# Patient Record
Sex: Female | Born: 1950
Health system: Southern US, Community
[De-identification: ages and names within clinical notes are randomized; demographics above are authoritative.]

## PROBLEM LIST (undated history)

## (undated) DIAGNOSIS — M75101 Unspecified rotator cuff tear or rupture of right shoulder, not specified as traumatic: Secondary | ICD-10-CM

## (undated) DIAGNOSIS — J189 Pneumonia, unspecified organism: Secondary | ICD-10-CM

## (undated) DIAGNOSIS — N186 End stage renal disease: Secondary | ICD-10-CM

## (undated) DIAGNOSIS — G629 Polyneuropathy, unspecified: Secondary | ICD-10-CM

## (undated) DIAGNOSIS — R0600 Dyspnea, unspecified: Secondary | ICD-10-CM

## (undated) DIAGNOSIS — N289 Disorder of kidney and ureter, unspecified: Secondary | ICD-10-CM

## (undated) DIAGNOSIS — M8580 Other specified disorders of bone density and structure, unspecified site: Secondary | ICD-10-CM

## (undated) DIAGNOSIS — M545 Low back pain, unspecified: Secondary | ICD-10-CM

## (undated) DIAGNOSIS — Z9289 Personal history of other medical treatment: Secondary | ICD-10-CM

## (undated) DIAGNOSIS — K219 Gastro-esophageal reflux disease without esophagitis: Secondary | ICD-10-CM

## (undated) DIAGNOSIS — F329 Major depressive disorder, single episode, unspecified: Secondary | ICD-10-CM

## (undated) DIAGNOSIS — F419 Anxiety disorder, unspecified: Secondary | ICD-10-CM

## (undated) DIAGNOSIS — D649 Anemia, unspecified: Secondary | ICD-10-CM

## (undated) DIAGNOSIS — F32A Depression, unspecified: Secondary | ICD-10-CM

## (undated) DIAGNOSIS — D332 Benign neoplasm of brain, unspecified: Secondary | ICD-10-CM

## (undated) DIAGNOSIS — K529 Noninfective gastroenteritis and colitis, unspecified: Secondary | ICD-10-CM

## (undated) DIAGNOSIS — K5790 Diverticulosis of intestine, part unspecified, without perforation or abscess without bleeding: Secondary | ICD-10-CM

## (undated) DIAGNOSIS — K589 Irritable bowel syndrome without diarrhea: Secondary | ICD-10-CM

## (undated) DIAGNOSIS — I10 Essential (primary) hypertension: Secondary | ICD-10-CM

## (undated) DIAGNOSIS — M1990 Unspecified osteoarthritis, unspecified site: Secondary | ICD-10-CM

## (undated) DIAGNOSIS — R51 Headache: Secondary | ICD-10-CM

## (undated) DIAGNOSIS — Z992 Dependence on renal dialysis: Secondary | ICD-10-CM

## (undated) DIAGNOSIS — M797 Fibromyalgia: Secondary | ICD-10-CM

## (undated) DIAGNOSIS — R519 Headache, unspecified: Secondary | ICD-10-CM

## (undated) DIAGNOSIS — J329 Chronic sinusitis, unspecified: Secondary | ICD-10-CM

## (undated) HISTORY — DX: Depression, unspecified: F32.A

## (undated) HISTORY — DX: Anemia, unspecified: D64.9

## (undated) HISTORY — DX: Low back pain, unspecified: M54.50

## (undated) HISTORY — DX: Other specified disorders of bone density and structure, unspecified site: M85.80

## (undated) HISTORY — DX: Irritable bowel syndrome, unspecified: K58.9

## (undated) HISTORY — PX: TONSILLECTOMY: SUR1361

## (undated) HISTORY — DX: Unspecified osteoarthritis, unspecified site: M19.90

## (undated) HISTORY — PX: TOTAL KNEE ARTHROPLASTY: SHX125

## (undated) HISTORY — PX: NASAL SINUS SURGERY: SHX719

## (undated) HISTORY — DX: Gastro-esophageal reflux disease without esophagitis: K21.9

## (undated) HISTORY — DX: Low back pain: M54.5

## (undated) HISTORY — DX: Diverticulosis of intestine, part unspecified, without perforation or abscess without bleeding: K57.90

## (undated) HISTORY — DX: Essential (primary) hypertension: I10

## (undated) HISTORY — PX: JOINT REPLACEMENT: SHX530

## (undated) HISTORY — DX: Anxiety disorder, unspecified: F41.9

## (undated) HISTORY — DX: Disorder of kidney and ureter, unspecified: N28.9

## (undated) HISTORY — DX: Major depressive disorder, single episode, unspecified: F32.9

## (undated) HISTORY — DX: Noninfective gastroenteritis and colitis, unspecified: K52.9

---

## 1998-05-22 ENCOUNTER — Encounter: Admission: RE | Admit: 1998-05-22 | Discharge: 1998-08-20 | Payer: Self-pay | Admitting: Internal Medicine

## 1999-12-25 ENCOUNTER — Other Ambulatory Visit: Admission: RE | Admit: 1999-12-25 | Discharge: 1999-12-25 | Payer: Self-pay | Admitting: Internal Medicine

## 2000-03-16 ENCOUNTER — Encounter: Admission: RE | Admit: 2000-03-16 | Discharge: 2000-06-14 | Payer: Self-pay | Admitting: Internal Medicine

## 2000-12-29 ENCOUNTER — Encounter: Admission: RE | Admit: 2000-12-29 | Discharge: 2001-03-29 | Payer: Self-pay | Admitting: Internal Medicine

## 2001-08-21 ENCOUNTER — Other Ambulatory Visit: Admission: RE | Admit: 2001-08-21 | Discharge: 2001-08-21 | Payer: Self-pay | Admitting: Internal Medicine

## 2001-12-13 HISTORY — PX: OTHER SURGICAL HISTORY: SHX169

## 2002-01-10 ENCOUNTER — Other Ambulatory Visit: Admission: RE | Admit: 2002-01-10 | Discharge: 2002-01-10 | Payer: Self-pay | Admitting: Obstetrics and Gynecology

## 2002-07-04 ENCOUNTER — Encounter: Admission: RE | Admit: 2002-07-04 | Discharge: 2002-09-11 | Payer: Self-pay

## 2002-08-29 ENCOUNTER — Encounter: Payer: Self-pay | Admitting: Internal Medicine

## 2002-08-29 ENCOUNTER — Ambulatory Visit (HOSPITAL_COMMUNITY): Admission: RE | Admit: 2002-08-29 | Discharge: 2002-08-29 | Payer: Self-pay | Admitting: Internal Medicine

## 2002-08-30 ENCOUNTER — Encounter: Payer: Self-pay | Admitting: Internal Medicine

## 2002-08-30 ENCOUNTER — Encounter (INDEPENDENT_AMBULATORY_CARE_PROVIDER_SITE_OTHER): Payer: Self-pay

## 2002-08-30 ENCOUNTER — Ambulatory Visit (HOSPITAL_COMMUNITY): Admission: RE | Admit: 2002-08-30 | Discharge: 2002-08-30 | Payer: Self-pay | Admitting: Internal Medicine

## 2002-10-08 ENCOUNTER — Ambulatory Visit (HOSPITAL_COMMUNITY): Admission: RE | Admit: 2002-10-08 | Discharge: 2002-10-08 | Payer: Self-pay | Admitting: Gastroenterology

## 2002-10-08 ENCOUNTER — Encounter: Payer: Self-pay | Admitting: Gastroenterology

## 2002-10-18 ENCOUNTER — Encounter: Payer: Self-pay | Admitting: Internal Medicine

## 2002-10-18 ENCOUNTER — Encounter: Admission: RE | Admit: 2002-10-18 | Discharge: 2002-10-18 | Payer: Self-pay | Admitting: Internal Medicine

## 2002-10-22 ENCOUNTER — Ambulatory Visit (HOSPITAL_COMMUNITY): Admission: RE | Admit: 2002-10-22 | Discharge: 2002-10-22 | Payer: Self-pay | Admitting: Internal Medicine

## 2002-10-22 ENCOUNTER — Encounter: Payer: Self-pay | Admitting: Internal Medicine

## 2002-10-30 ENCOUNTER — Ambulatory Visit (HOSPITAL_COMMUNITY): Admission: RE | Admit: 2002-10-30 | Discharge: 2002-10-30 | Payer: Self-pay | Admitting: Neurosurgery

## 2002-10-30 ENCOUNTER — Encounter: Payer: Self-pay | Admitting: Neurosurgery

## 2002-11-13 ENCOUNTER — Encounter: Payer: Self-pay | Admitting: Neurosurgery

## 2002-11-13 ENCOUNTER — Ambulatory Visit (HOSPITAL_COMMUNITY): Admission: RE | Admit: 2002-11-13 | Discharge: 2002-11-13 | Payer: Self-pay | Admitting: Neurosurgery

## 2002-11-22 ENCOUNTER — Encounter: Payer: Self-pay | Admitting: Neurosurgery

## 2002-11-26 ENCOUNTER — Inpatient Hospital Stay (HOSPITAL_COMMUNITY): Admission: RE | Admit: 2002-11-26 | Discharge: 2002-11-30 | Payer: Self-pay | Admitting: Neurosurgery

## 2002-11-26 ENCOUNTER — Encounter (INDEPENDENT_AMBULATORY_CARE_PROVIDER_SITE_OTHER): Payer: Self-pay | Admitting: *Deleted

## 2002-11-27 ENCOUNTER — Encounter: Payer: Self-pay | Admitting: Neurosurgery

## 2002-12-10 ENCOUNTER — Encounter: Payer: Self-pay | Admitting: Neurosurgery

## 2002-12-10 ENCOUNTER — Ambulatory Visit (HOSPITAL_COMMUNITY): Admission: RE | Admit: 2002-12-10 | Discharge: 2002-12-10 | Payer: Self-pay | Admitting: Neurosurgery

## 2003-01-10 ENCOUNTER — Other Ambulatory Visit: Admission: RE | Admit: 2003-01-10 | Discharge: 2003-01-10 | Payer: Self-pay | Admitting: Obstetrics and Gynecology

## 2003-03-04 ENCOUNTER — Encounter: Payer: Self-pay | Admitting: Neurosurgery

## 2003-03-04 ENCOUNTER — Ambulatory Visit (HOSPITAL_COMMUNITY): Admission: RE | Admit: 2003-03-04 | Discharge: 2003-03-04 | Payer: Self-pay | Admitting: Neurosurgery

## 2003-05-03 ENCOUNTER — Encounter: Payer: Self-pay | Admitting: Gastroenterology

## 2003-05-03 ENCOUNTER — Ambulatory Visit (HOSPITAL_COMMUNITY): Admission: RE | Admit: 2003-05-03 | Discharge: 2003-05-03 | Payer: Self-pay | Admitting: Gastroenterology

## 2003-08-12 ENCOUNTER — Ambulatory Visit (HOSPITAL_COMMUNITY): Admission: RE | Admit: 2003-08-12 | Discharge: 2003-08-12 | Payer: Self-pay | Admitting: Neurosurgery

## 2003-08-12 ENCOUNTER — Encounter: Payer: Self-pay | Admitting: Neurosurgery

## 2004-08-01 ENCOUNTER — Ambulatory Visit (HOSPITAL_COMMUNITY): Admission: RE | Admit: 2004-08-01 | Discharge: 2004-08-01 | Payer: Self-pay | Admitting: Neurosurgery

## 2004-08-12 ENCOUNTER — Ambulatory Visit (HOSPITAL_COMMUNITY): Admission: RE | Admit: 2004-08-12 | Discharge: 2004-08-12 | Payer: Self-pay | Admitting: Gastroenterology

## 2004-11-11 ENCOUNTER — Ambulatory Visit: Payer: Self-pay | Admitting: Internal Medicine

## 2004-11-17 ENCOUNTER — Other Ambulatory Visit: Admission: RE | Admit: 2004-11-17 | Discharge: 2004-11-17 | Payer: Self-pay | Admitting: Internal Medicine

## 2004-11-17 ENCOUNTER — Ambulatory Visit: Payer: Self-pay | Admitting: Internal Medicine

## 2005-08-02 ENCOUNTER — Ambulatory Visit: Payer: Self-pay | Admitting: Internal Medicine

## 2005-08-03 ENCOUNTER — Ambulatory Visit: Payer: Self-pay | Admitting: Internal Medicine

## 2005-11-17 ENCOUNTER — Ambulatory Visit: Payer: Self-pay | Admitting: Internal Medicine

## 2006-01-20 ENCOUNTER — Encounter: Admission: RE | Admit: 2006-01-20 | Discharge: 2006-01-20 | Payer: Self-pay | Admitting: Orthopedic Surgery

## 2006-01-22 ENCOUNTER — Encounter: Admission: RE | Admit: 2006-01-22 | Discharge: 2006-01-22 | Payer: Self-pay | Admitting: Orthopedic Surgery

## 2006-01-24 ENCOUNTER — Ambulatory Visit (HOSPITAL_BASED_OUTPATIENT_CLINIC_OR_DEPARTMENT_OTHER): Admission: RE | Admit: 2006-01-24 | Discharge: 2006-01-24 | Payer: Self-pay | Admitting: Orthopedic Surgery

## 2006-01-25 ENCOUNTER — Emergency Department (HOSPITAL_COMMUNITY): Admission: EM | Admit: 2006-01-25 | Discharge: 2006-01-25 | Payer: Self-pay | Admitting: Emergency Medicine

## 2006-02-07 ENCOUNTER — Other Ambulatory Visit: Admission: RE | Admit: 2006-02-07 | Discharge: 2006-02-07 | Payer: Self-pay | Admitting: Obstetrics and Gynecology

## 2006-02-16 ENCOUNTER — Ambulatory Visit: Payer: Self-pay | Admitting: Internal Medicine

## 2006-02-17 ENCOUNTER — Encounter: Admission: RE | Admit: 2006-02-17 | Discharge: 2006-03-09 | Payer: Self-pay | Admitting: Orthopedic Surgery

## 2006-05-30 ENCOUNTER — Ambulatory Visit: Payer: Self-pay | Admitting: Internal Medicine

## 2006-07-20 ENCOUNTER — Ambulatory Visit: Payer: Self-pay | Admitting: Internal Medicine

## 2006-09-24 ENCOUNTER — Ambulatory Visit (HOSPITAL_COMMUNITY): Admission: RE | Admit: 2006-09-24 | Discharge: 2006-09-24 | Payer: Self-pay | Admitting: Neurosurgery

## 2006-09-28 ENCOUNTER — Ambulatory Visit: Payer: Self-pay | Admitting: Internal Medicine

## 2006-12-28 ENCOUNTER — Ambulatory Visit: Payer: Self-pay | Admitting: Internal Medicine

## 2007-03-01 ENCOUNTER — Ambulatory Visit: Payer: Self-pay | Admitting: Internal Medicine

## 2007-05-03 ENCOUNTER — Ambulatory Visit: Payer: Self-pay | Admitting: Internal Medicine

## 2007-05-31 ENCOUNTER — Ambulatory Visit: Payer: Self-pay | Admitting: Internal Medicine

## 2007-05-31 LAB — CONVERTED CEMR LAB
ALT: 17 units/L (ref 0–40)
AST: 18 units/L (ref 0–37)
Albumin: 3.6 g/dL (ref 3.5–5.2)
Alkaline Phosphatase: 84 units/L (ref 39–117)
BUN: 20 mg/dL (ref 6–23)
Basophils Absolute: 0 10*3/uL (ref 0.0–0.1)
Basophils Relative: 0.6 % (ref 0.0–1.0)
Bilirubin, Direct: 0.1 mg/dL (ref 0.0–0.3)
CO2: 33 meq/L — ABNORMAL HIGH (ref 19–32)
Calcium: 9.6 mg/dL (ref 8.4–10.5)
Chloride: 104 meq/L (ref 96–112)
Cholesterol: 194 mg/dL (ref 0–200)
Creatinine, Ser: 0.8 mg/dL (ref 0.4–1.2)
Eosinophils Absolute: 0.2 10*3/uL (ref 0.0–0.6)
Eosinophils Relative: 3.1 % (ref 0.0–5.0)
GFR calc Af Amer: 96 mL/min
GFR calc non Af Amer: 79 mL/min
Glucose, Bld: 164 mg/dL — ABNORMAL HIGH (ref 70–99)
HCT: 33.4 % — ABNORMAL LOW (ref 36.0–46.0)
HDL: 49.9 mg/dL (ref >39.0)
Hemoglobin: 11.4 g/dL — ABNORMAL LOW (ref 12.0–15.0)
Hgb A1c MFr Bld: 7.3 % — ABNORMAL HIGH (ref 4.6–6.0)
LDL Cholesterol: 130 mg/dL — ABNORMAL HIGH (ref 0–99)
Lymphocytes Relative: 21.7 % (ref 12.0–46.0)
MCHC: 34.2 g/dL (ref 30.0–36.0)
MCV: 87.7 fL (ref 78.0–100.0)
Monocytes Absolute: 0.3 10*3/uL (ref 0.2–0.7)
Monocytes Relative: 3.7 % (ref 3.0–11.0)
Neutro Abs: 5.4 10*3/uL (ref 1.4–7.7)
Neutrophils Relative %: 70.9 % (ref 43.0–77.0)
Platelets: 269 10*3/uL (ref 150–400)
Potassium: 4.8 meq/L (ref 3.5–5.1)
RBC: 3.81 M/uL — ABNORMAL LOW (ref 3.87–5.11)
RDW: 12.1 % (ref 11.5–14.6)
Sodium: 140 meq/L (ref 135–145)
TSH: 1.6 microintl units/mL (ref 0.35–5.50)
Total Bilirubin: 0.3 mg/dL (ref 0.3–1.2)
Total CHOL/HDL Ratio: 3.9
Total Protein: 7.2 g/dL (ref 6.0–8.3)
Triglycerides: 69 mg/dL (ref 0–149)
VLDL: 14 mg/dL (ref 0–40)
Vit D, 1,25-Dihydroxy: 39 (ref 20–57)
WBC: 7.5 10*3/uL (ref 4.5–10.5)

## 2007-08-02 ENCOUNTER — Ambulatory Visit: Payer: Self-pay | Admitting: Internal Medicine

## 2007-09-29 ENCOUNTER — Ambulatory Visit: Payer: Self-pay | Admitting: Internal Medicine

## 2007-10-04 ENCOUNTER — Inpatient Hospital Stay (HOSPITAL_COMMUNITY): Admission: RE | Admit: 2007-10-04 | Discharge: 2007-10-08 | Payer: Self-pay | Admitting: Orthopedic Surgery

## 2007-10-07 ENCOUNTER — Encounter: Payer: Self-pay | Admitting: Internal Medicine

## 2007-10-07 DIAGNOSIS — K219 Gastro-esophageal reflux disease without esophagitis: Secondary | ICD-10-CM | POA: Insufficient documentation

## 2007-10-07 DIAGNOSIS — E119 Type 2 diabetes mellitus without complications: Secondary | ICD-10-CM | POA: Insufficient documentation

## 2007-10-07 DIAGNOSIS — I10 Essential (primary) hypertension: Secondary | ICD-10-CM | POA: Insufficient documentation

## 2007-10-07 DIAGNOSIS — E1129 Type 2 diabetes mellitus with other diabetic kidney complication: Secondary | ICD-10-CM | POA: Insufficient documentation

## 2007-10-07 DIAGNOSIS — R809 Proteinuria, unspecified: Secondary | ICD-10-CM | POA: Insufficient documentation

## 2007-10-12 ENCOUNTER — Encounter: Payer: Self-pay | Admitting: Internal Medicine

## 2007-10-19 ENCOUNTER — Telehealth: Payer: Self-pay | Admitting: Internal Medicine

## 2007-10-27 ENCOUNTER — Encounter: Admission: RE | Admit: 2007-10-27 | Discharge: 2007-11-17 | Payer: Self-pay | Admitting: Orthopedic Surgery

## 2007-11-08 ENCOUNTER — Ambulatory Visit: Payer: Self-pay | Admitting: Internal Medicine

## 2007-11-08 DIAGNOSIS — F411 Generalized anxiety disorder: Secondary | ICD-10-CM

## 2007-11-08 DIAGNOSIS — M545 Low back pain, unspecified: Secondary | ICD-10-CM | POA: Insufficient documentation

## 2007-11-08 DIAGNOSIS — M1990 Unspecified osteoarthritis, unspecified site: Secondary | ICD-10-CM | POA: Insufficient documentation

## 2007-11-08 DIAGNOSIS — F32A Depression, unspecified: Secondary | ICD-10-CM | POA: Insufficient documentation

## 2007-11-08 DIAGNOSIS — F419 Anxiety disorder, unspecified: Secondary | ICD-10-CM | POA: Insufficient documentation

## 2007-11-08 DIAGNOSIS — F329 Major depressive disorder, single episode, unspecified: Secondary | ICD-10-CM

## 2007-11-08 LAB — CONVERTED CEMR LAB: Blood Glucose, Fingerstick: 174

## 2008-01-10 ENCOUNTER — Ambulatory Visit: Payer: Self-pay | Admitting: Internal Medicine

## 2008-01-10 DIAGNOSIS — E7800 Pure hypercholesterolemia, unspecified: Secondary | ICD-10-CM | POA: Insufficient documentation

## 2008-01-10 LAB — CONVERTED CEMR LAB: Blood Glucose, Fingerstick: 164

## 2008-01-12 LAB — CONVERTED CEMR LAB
ALT: 19 units/L (ref 0–35)
AST: 17 units/L (ref 0–37)
Albumin: 3.5 g/dL (ref 3.5–5.2)
Alkaline Phosphatase: 93 units/L (ref 39–117)
BUN: 23 mg/dL (ref 6–23)
Bilirubin, Direct: 0.1 mg/dL (ref 0.0–0.3)
CO2: 30 meq/L (ref 19–32)
Calcium: 9.6 mg/dL (ref 8.4–10.5)
Chloride: 102 meq/L (ref 96–112)
Cholesterol: 164 mg/dL (ref 0–200)
Creatinine, Ser: 0.8 mg/dL (ref 0.4–1.2)
GFR calc Af Amer: 96 mL/min
GFR calc non Af Amer: 79 mL/min
Glucose, Bld: 172 mg/dL — ABNORMAL HIGH (ref 70–99)
HDL: 44.4 mg/dL (ref >39.0)
Hgb A1c MFr Bld: 7.4 % — ABNORMAL HIGH (ref 4.6–6.0)
LDL Cholesterol: 95 mg/dL (ref 0–99)
Potassium: 5 meq/L (ref 3.5–5.1)
Sodium: 138 meq/L (ref 135–145)
TSH: 2.64 microintl units/mL (ref 0.35–5.50)
Total Bilirubin: 0.5 mg/dL (ref 0.3–1.2)
Total CHOL/HDL Ratio: 3.7
Total Protein: 6.6 g/dL (ref 6.0–8.3)
Triglycerides: 122 mg/dL (ref 0–149)
VLDL: 24 mg/dL (ref 0–40)

## 2008-01-18 ENCOUNTER — Telehealth: Payer: Self-pay | Admitting: Internal Medicine

## 2008-01-31 ENCOUNTER — Encounter: Payer: Self-pay | Admitting: Internal Medicine

## 2008-02-02 ENCOUNTER — Inpatient Hospital Stay (HOSPITAL_COMMUNITY): Admission: RE | Admit: 2008-02-02 | Discharge: 2008-02-05 | Payer: Self-pay | Admitting: Orthopedic Surgery

## 2008-03-04 ENCOUNTER — Encounter: Admission: RE | Admit: 2008-03-04 | Discharge: 2008-04-11 | Payer: Self-pay | Admitting: Orthopedic Surgery

## 2008-03-27 ENCOUNTER — Telehealth: Payer: Self-pay | Admitting: Internal Medicine

## 2008-04-01 ENCOUNTER — Telehealth (INDEPENDENT_AMBULATORY_CARE_PROVIDER_SITE_OTHER): Payer: Self-pay | Admitting: *Deleted

## 2008-04-08 ENCOUNTER — Ambulatory Visit: Payer: Self-pay | Admitting: Internal Medicine

## 2008-04-10 ENCOUNTER — Ambulatory Visit: Payer: Self-pay | Admitting: Internal Medicine

## 2008-04-10 DIAGNOSIS — M25569 Pain in unspecified knee: Secondary | ICD-10-CM | POA: Insufficient documentation

## 2008-04-10 LAB — CONVERTED CEMR LAB
ALT: 16 units/L (ref 0–35)
AST: 18 units/L (ref 0–37)
Albumin: 4.2 g/dL (ref 3.5–5.2)
Alkaline Phosphatase: 92 units/L (ref 39–117)
BUN: 27 mg/dL — ABNORMAL HIGH (ref 6–23)
Bilirubin, Direct: 0.1 mg/dL (ref 0.0–0.3)
CO2: 24 meq/L (ref 19–32)
Calcium: 10.1 mg/dL (ref 8.4–10.5)
Chloride: 105 meq/L (ref 96–112)
Creatinine, Ser: 1.1 mg/dL (ref 0.4–1.2)
GFR calc Af Amer: 66 mL/min
GFR calc non Af Amer: 55 mL/min
Glucose, Bld: 177 mg/dL — ABNORMAL HIGH (ref 70–99)
Hgb A1c MFr Bld: 7.2 % — ABNORMAL HIGH (ref 4.6–6.0)
Potassium: 5 meq/L (ref 3.5–5.1)
Sodium: 140 meq/L (ref 135–145)
TSH: 1.89 microintl units/mL (ref 0.35–5.50)
Total Bilirubin: 0.7 mg/dL (ref 0.3–1.2)
Total Protein: 7.9 g/dL (ref 6.0–8.3)

## 2008-05-24 ENCOUNTER — Telehealth: Payer: Self-pay | Admitting: Internal Medicine

## 2008-06-04 ENCOUNTER — Telehealth: Payer: Self-pay | Admitting: Internal Medicine

## 2008-06-05 ENCOUNTER — Telehealth: Payer: Self-pay | Admitting: Internal Medicine

## 2008-07-08 ENCOUNTER — Ambulatory Visit: Payer: Self-pay | Admitting: Internal Medicine

## 2008-07-09 LAB — CONVERTED CEMR LAB
BUN: 21 mg/dL
CO2: 28 meq/L
Calcium: 9.7 mg/dL
Chloride: 100 meq/L
Creatinine, Ser: 1 mg/dL
GFR calc Af Amer: 74 mL/min
GFR calc non Af Amer: 61 mL/min
Glucose, Bld: 166 mg/dL — ABNORMAL HIGH
Hgb A1c MFr Bld: 8 % — ABNORMAL HIGH
Potassium: 4.7 meq/L
Sodium: 138 meq/L

## 2008-07-10 ENCOUNTER — Ambulatory Visit: Payer: Self-pay | Admitting: Internal Medicine

## 2008-07-12 ENCOUNTER — Telehealth: Payer: Self-pay | Admitting: Internal Medicine

## 2008-07-24 ENCOUNTER — Telehealth: Payer: Self-pay | Admitting: Internal Medicine

## 2008-08-26 ENCOUNTER — Telehealth: Payer: Self-pay | Admitting: Internal Medicine

## 2008-09-25 ENCOUNTER — Ambulatory Visit: Payer: Self-pay | Admitting: Gastroenterology

## 2008-09-25 ENCOUNTER — Inpatient Hospital Stay (HOSPITAL_COMMUNITY): Admission: AD | Admit: 2008-09-25 | Discharge: 2008-09-30 | Payer: Self-pay | Admitting: Gastroenterology

## 2008-09-25 ENCOUNTER — Telehealth: Payer: Self-pay | Admitting: Gastroenterology

## 2008-09-27 ENCOUNTER — Encounter: Payer: Self-pay | Admitting: Gastroenterology

## 2008-09-30 ENCOUNTER — Encounter: Payer: Self-pay | Admitting: Gastroenterology

## 2008-10-01 ENCOUNTER — Encounter: Payer: Self-pay | Admitting: Gastroenterology

## 2008-10-15 ENCOUNTER — Telehealth: Payer: Self-pay | Admitting: Internal Medicine

## 2008-10-16 ENCOUNTER — Ambulatory Visit: Payer: Self-pay | Admitting: Internal Medicine

## 2008-10-16 DIAGNOSIS — K529 Noninfective gastroenteritis and colitis, unspecified: Secondary | ICD-10-CM | POA: Insufficient documentation

## 2008-10-16 DIAGNOSIS — K5289 Other specified noninfective gastroenteritis and colitis: Secondary | ICD-10-CM | POA: Insufficient documentation

## 2008-10-16 DIAGNOSIS — K559 Vascular disorder of intestine, unspecified: Secondary | ICD-10-CM | POA: Insufficient documentation

## 2008-10-21 LAB — CONVERTED CEMR LAB
BUN: 20 mg/dL (ref 6–23)
CO2: 30 meq/L (ref 19–32)
Calcium: 9.5 mg/dL (ref 8.4–10.5)
Chloride: 103 meq/L (ref 96–112)
Creatinine, Ser: 0.7 mg/dL (ref 0.4–1.2)
GFR calc Af Amer: 111 mL/min
GFR calc non Af Amer: 92 mL/min
Glucose, Bld: 128 mg/dL — ABNORMAL HIGH (ref 70–99)
Hgb A1c MFr Bld: 6.8 % — ABNORMAL HIGH (ref 4.6–6.0)
Potassium: 4.4 meq/L (ref 3.5–5.1)
Sodium: 140 meq/L (ref 135–145)

## 2008-12-13 DIAGNOSIS — K529 Noninfective gastroenteritis and colitis, unspecified: Secondary | ICD-10-CM

## 2008-12-13 HISTORY — DX: Noninfective gastroenteritis and colitis, unspecified: K52.9

## 2008-12-30 ENCOUNTER — Telehealth: Payer: Self-pay | Admitting: Internal Medicine

## 2009-01-08 ENCOUNTER — Ambulatory Visit: Payer: Self-pay | Admitting: Internal Medicine

## 2009-01-08 LAB — CONVERTED CEMR LAB
ALT: 17 units/L (ref 0–35)
AST: 19 units/L (ref 0–37)
Albumin: 3.9 g/dL (ref 3.5–5.2)
Alkaline Phosphatase: 62 units/L (ref 39–117)
BUN: 39 mg/dL — ABNORMAL HIGH (ref 6–23)
Basophils Absolute: 0.1 10*3/uL (ref 0.0–0.1)
Basophils Relative: 1.1 % (ref 0.0–3.0)
Bilirubin, Direct: 0.1 mg/dL (ref 0.0–0.3)
CO2: 28 meq/L (ref 19–32)
Calcium: 9.7 mg/dL (ref 8.4–10.5)
Chloride: 105 meq/L (ref 96–112)
Cholesterol: 175 mg/dL (ref 0–200)
Creatinine, Ser: 1 mg/dL (ref 0.4–1.2)
Eosinophils Absolute: 0.3 10*3/uL (ref 0.0–0.7)
Eosinophils Relative: 3.2 % (ref 0.0–5.0)
GFR calc Af Amer: 74 mL/min
GFR calc non Af Amer: 61 mL/min
Glucose, Bld: 135 mg/dL — ABNORMAL HIGH (ref 70–99)
HCT: 36.8 % (ref 36.0–46.0)
HDL: 56.8 mg/dL (ref >39.0)
Hemoglobin: 12.6 g/dL (ref 12.0–15.0)
Hgb A1c MFr Bld: 6.4 % — ABNORMAL HIGH (ref 4.6–6.0)
LDL Cholesterol: 110 mg/dL — ABNORMAL HIGH (ref 0–99)
Lymphocytes Relative: 27.2 % (ref 12.0–46.0)
MCHC: 34.3 g/dL (ref 30.0–36.0)
MCV: 91.4 fL (ref 78.0–100.0)
Monocytes Absolute: 0.4 10*3/uL (ref 0.1–1.0)
Monocytes Relative: 5.5 % (ref 3.0–12.0)
Neutro Abs: 5.1 10*3/uL (ref 1.4–7.7)
Neutrophils Relative %: 63 % (ref 43.0–77.0)
Platelets: 197 10*3/uL (ref 150–400)
Potassium: 4.9 meq/L (ref 3.5–5.1)
RBC: 4.03 M/uL (ref 3.87–5.11)
RDW: 11.7 % (ref 11.5–14.6)
Sodium: 138 meq/L (ref 135–145)
TSH: 1.63 microintl units/mL (ref 0.35–5.50)
Total Bilirubin: 0.8 mg/dL (ref 0.3–1.2)
Total CHOL/HDL Ratio: 3.1
Total Protein: 7.2 g/dL (ref 6.0–8.3)
Triglycerides: 41 mg/dL (ref 0–149)
VLDL: 8 mg/dL (ref 0–40)
Vit D, 1,25-Dihydroxy: 64 (ref 30–89)
WBC: 8.1 10*3/uL (ref 4.5–10.5)

## 2009-01-15 ENCOUNTER — Ambulatory Visit: Payer: Self-pay | Admitting: Internal Medicine

## 2009-01-15 DIAGNOSIS — H43399 Other vitreous opacities, unspecified eye: Secondary | ICD-10-CM | POA: Insufficient documentation

## 2009-01-16 ENCOUNTER — Ambulatory Visit: Payer: Self-pay | Admitting: Gastroenterology

## 2009-01-31 ENCOUNTER — Telehealth: Payer: Self-pay | Admitting: Internal Medicine

## 2009-03-24 ENCOUNTER — Encounter: Payer: Self-pay | Admitting: Internal Medicine

## 2009-04-23 ENCOUNTER — Ambulatory Visit: Payer: Self-pay | Admitting: Internal Medicine

## 2009-04-23 LAB — CONVERTED CEMR LAB
BUN: 32 mg/dL — ABNORMAL HIGH (ref 6–23)
CO2: 28 meq/L (ref 19–32)
Calcium: 9.7 mg/dL (ref 8.4–10.5)
Chloride: 108 meq/L (ref 96–112)
Creatinine, Ser: 1 mg/dL (ref 0.4–1.2)
GFR calc non Af Amer: 60.7 mL/min (ref >60)
Glucose, Bld: 157 mg/dL — ABNORMAL HIGH (ref 70–99)
Hgb A1c MFr Bld: 6.2 % (ref 4.6–6.5)
Potassium: 5 meq/L (ref 3.5–5.1)
Sodium: 144 meq/L (ref 135–145)

## 2009-04-30 ENCOUNTER — Ambulatory Visit: Payer: Self-pay | Admitting: Internal Medicine

## 2009-05-16 ENCOUNTER — Telehealth: Payer: Self-pay | Admitting: Internal Medicine

## 2009-05-19 ENCOUNTER — Telehealth: Payer: Self-pay | Admitting: Internal Medicine

## 2009-05-27 ENCOUNTER — Encounter: Payer: Self-pay | Admitting: Internal Medicine

## 2009-06-02 ENCOUNTER — Telehealth: Payer: Self-pay | Admitting: Internal Medicine

## 2009-06-17 ENCOUNTER — Telehealth: Payer: Self-pay | Admitting: Internal Medicine

## 2009-06-18 ENCOUNTER — Ambulatory Visit: Payer: Self-pay | Admitting: Internal Medicine

## 2009-06-18 DIAGNOSIS — N259 Disorder resulting from impaired renal tubular function, unspecified: Secondary | ICD-10-CM | POA: Insufficient documentation

## 2009-06-18 DIAGNOSIS — D649 Anemia, unspecified: Secondary | ICD-10-CM | POA: Insufficient documentation

## 2009-06-18 DIAGNOSIS — R197 Diarrhea, unspecified: Secondary | ICD-10-CM | POA: Insufficient documentation

## 2009-06-18 LAB — CONVERTED CEMR LAB
ALT: 12 units/L (ref 0–35)
AST: 21 units/L (ref 0–37)
Albumin: 3.1 g/dL — ABNORMAL LOW (ref 3.5–5.2)
Alkaline Phosphatase: 61 units/L (ref 39–117)
BUN: 34 mg/dL — ABNORMAL HIGH (ref 6–23)
Basophils Absolute: 0.1 10*3/uL (ref 0.0–0.1)
Basophils Relative: 0.9 % (ref 0.0–3.0)
Bilirubin, Direct: 0.2 mg/dL (ref 0.0–0.3)
CO2: 21 meq/L (ref 19–32)
Calcium: 9 mg/dL (ref 8.4–10.5)
Chloride: 105 meq/L (ref 96–112)
Creatinine, Ser: 1.5 mg/dL — ABNORMAL HIGH (ref 0.4–1.2)
Eosinophils Absolute: 0.2 10*3/uL (ref 0.0–0.7)
Eosinophils Relative: 2.7 % (ref 0.0–5.0)
GFR calc non Af Amer: 37.99 mL/min (ref >60)
Glucose, Bld: 111 mg/dL — ABNORMAL HIGH (ref 70–99)
HCT: 30.5 % — ABNORMAL LOW (ref 36.0–46.0)
Hemoglobin: 10.3 g/dL — ABNORMAL LOW (ref 12.0–15.0)
Lymphocytes Relative: 25.5 % (ref 12.0–46.0)
Lymphs Abs: 1.9 10*3/uL (ref 0.7–4.0)
MCHC: 33.8 g/dL (ref 30.0–36.0)
MCV: 93.6 fL (ref 78.0–100.0)
Monocytes Absolute: 0.8 10*3/uL (ref 0.1–1.0)
Monocytes Relative: 10.6 % (ref 3.0–12.0)
Neutro Abs: 4.3 10*3/uL (ref 1.4–7.7)
Neutrophils Relative %: 60.3 % (ref 43.0–77.0)
Platelets: 230 10*3/uL (ref 150.0–400.0)
Potassium: 5.4 meq/L — ABNORMAL HIGH (ref 3.5–5.1)
RBC: 3.25 M/uL — ABNORMAL LOW (ref 3.87–5.11)
RDW: 11.7 % (ref 11.5–14.6)
Sodium: 138 meq/L (ref 135–145)
Total Bilirubin: 0.8 mg/dL (ref 0.3–1.2)
Total Protein: 6.6 g/dL (ref 6.0–8.3)
WBC: 7.3 10*3/uL (ref 4.5–10.5)

## 2009-06-19 ENCOUNTER — Encounter: Payer: Self-pay | Admitting: Internal Medicine

## 2009-06-23 ENCOUNTER — Ambulatory Visit: Payer: Self-pay | Admitting: Internal Medicine

## 2009-06-23 ENCOUNTER — Encounter: Payer: Self-pay | Admitting: Internal Medicine

## 2009-06-23 LAB — HM COLONOSCOPY

## 2009-06-25 ENCOUNTER — Ambulatory Visit: Payer: Self-pay | Admitting: Internal Medicine

## 2009-06-26 ENCOUNTER — Telehealth: Payer: Self-pay | Admitting: Internal Medicine

## 2009-06-26 ENCOUNTER — Encounter: Payer: Self-pay | Admitting: Internal Medicine

## 2009-06-27 ENCOUNTER — Telehealth: Payer: Self-pay | Admitting: Internal Medicine

## 2009-06-27 LAB — CONVERTED CEMR LAB
ALT: 11 units/L (ref 0–35)
AST: 14 units/L (ref 0–37)
Albumin: 3.4 g/dL — ABNORMAL LOW (ref 3.5–5.2)
Alkaline Phosphatase: 61 units/L (ref 39–117)
BUN: 27 mg/dL — ABNORMAL HIGH (ref 6–23)
CO2: 25 meq/L (ref 19–32)
Calcium: 9.4 mg/dL (ref 8.4–10.5)
Chloride: 108 meq/L (ref 96–112)
Creatinine, Ser: 1.1 mg/dL (ref 0.4–1.2)
GFR calc non Af Amer: 54.34 mL/min (ref >60)
Glucose, Bld: 89 mg/dL (ref 70–99)
Potassium: 4.5 meq/L (ref 3.5–5.1)
Sodium: 143 meq/L (ref 135–145)
Total Bilirubin: 0.3 mg/dL (ref 0.3–1.2)
Total Protein: 6.7 g/dL (ref 6.0–8.3)

## 2009-07-23 ENCOUNTER — Ambulatory Visit: Payer: Self-pay | Admitting: Internal Medicine

## 2009-07-23 LAB — CONVERTED CEMR LAB
ALT: 16 units/L (ref 0–35)
AST: 16 units/L (ref 0–37)
Albumin: 3.7 g/dL (ref 3.5–5.2)
Alkaline Phosphatase: 53 units/L (ref 39–117)
BUN: 28 mg/dL — ABNORMAL HIGH (ref 6–23)
Bilirubin, Direct: 0.1 mg/dL (ref 0.0–0.3)
CO2: 31 meq/L (ref 19–32)
Calcium: 9.6 mg/dL (ref 8.4–10.5)
Chloride: 104 meq/L (ref 96–112)
Cholesterol: 148 mg/dL (ref 0–200)
Creatinine, Ser: 1 mg/dL (ref 0.4–1.2)
GFR calc non Af Amer: 60.64 mL/min (ref >60)
Glucose, Bld: 110 mg/dL — ABNORMAL HIGH (ref 70–99)
HDL: 66.9 mg/dL (ref >39.00)
Hgb A1c MFr Bld: 5.8 % (ref 4.6–6.5)
LDL Cholesterol: 77 mg/dL (ref 0–99)
Potassium: 4.3 meq/L (ref 3.5–5.1)
Sodium: 141 meq/L (ref 135–145)
TSH: 1.39 microintl units/mL (ref 0.35–5.50)
Total Bilirubin: 0.9 mg/dL (ref 0.3–1.2)
Total CHOL/HDL Ratio: 2
Total Protein: 6.6 g/dL (ref 6.0–8.3)
Triglycerides: 21 mg/dL (ref 0.0–149.0)
VLDL: 4.2 mg/dL (ref 0.0–40.0)

## 2009-07-30 ENCOUNTER — Ambulatory Visit: Payer: Self-pay | Admitting: Internal Medicine

## 2009-08-06 ENCOUNTER — Ambulatory Visit: Payer: Self-pay | Admitting: Internal Medicine

## 2009-10-02 ENCOUNTER — Telehealth: Payer: Self-pay | Admitting: Internal Medicine

## 2009-10-15 ENCOUNTER — Encounter: Payer: Self-pay | Admitting: Internal Medicine

## 2009-10-27 ENCOUNTER — Ambulatory Visit: Payer: Self-pay | Admitting: Internal Medicine

## 2009-10-27 LAB — CONVERTED CEMR LAB
ALT: 17 units/L (ref 0–35)
AST: 21 units/L (ref 0–37)
Albumin: 3.7 g/dL (ref 3.5–5.2)
Alkaline Phosphatase: 64 units/L (ref 39–117)
BUN: 30 mg/dL — ABNORMAL HIGH (ref 6–23)
Bilirubin, Direct: 0.1 mg/dL (ref 0.0–0.3)
CO2: 26 meq/L (ref 19–32)
Calcium: 9.3 mg/dL (ref 8.4–10.5)
Chloride: 103 meq/L (ref 96–112)
Cholesterol: 170 mg/dL (ref 0–200)
Creatinine, Ser: 0.9 mg/dL (ref 0.4–1.2)
GFR calc non Af Amer: 68.42 mL/min (ref >60)
Glucose, Bld: 88 mg/dL (ref 70–99)
HDL: 65.8 mg/dL (ref >39.00)
Hgb A1c MFr Bld: 6.3 % (ref 4.6–6.5)
LDL Cholesterol: 95 mg/dL (ref 0–99)
Potassium: 4.6 meq/L (ref 3.5–5.1)
Sodium: 139 meq/L (ref 135–145)
Total Bilirubin: 0.6 mg/dL (ref 0.3–1.2)
Total CHOL/HDL Ratio: 3
Total Protein: 6.6 g/dL (ref 6.0–8.3)
Triglycerides: 48 mg/dL (ref 0.0–149.0)
VLDL: 9.6 mg/dL (ref 0.0–40.0)

## 2009-10-29 ENCOUNTER — Ambulatory Visit: Payer: Self-pay | Admitting: Internal Medicine

## 2009-10-29 DIAGNOSIS — Z87891 Personal history of nicotine dependence: Secondary | ICD-10-CM | POA: Insufficient documentation

## 2009-10-29 DIAGNOSIS — L259 Unspecified contact dermatitis, unspecified cause: Secondary | ICD-10-CM | POA: Insufficient documentation

## 2009-11-13 ENCOUNTER — Encounter: Payer: Self-pay | Admitting: Internal Medicine

## 2010-01-08 ENCOUNTER — Telehealth: Payer: Self-pay | Admitting: Internal Medicine

## 2010-01-28 ENCOUNTER — Ambulatory Visit: Payer: Self-pay | Admitting: Internal Medicine

## 2010-01-28 LAB — CONVERTED CEMR LAB
ALT: 32 units/L (ref 0–35)
AST: 33 units/L (ref 0–37)
Albumin: 3.8 g/dL (ref 3.5–5.2)
Alkaline Phosphatase: 55 units/L (ref 39–117)
BUN: 40 mg/dL — ABNORMAL HIGH (ref 6–23)
Bilirubin, Direct: 0.1 mg/dL (ref 0.0–0.3)
CO2: 30 meq/L (ref 19–32)
Calcium: 9.4 mg/dL (ref 8.4–10.5)
Chloride: 107 meq/L (ref 96–112)
Creatinine, Ser: 1.1 mg/dL (ref 0.4–1.2)
GFR calc non Af Amer: 54.23 mL/min (ref >60)
Glucose, Bld: 94 mg/dL (ref 70–99)
Hgb A1c MFr Bld: 6.2 % (ref 4.6–6.5)
Potassium: 4.9 meq/L (ref 3.5–5.1)
Sodium: 140 meq/L (ref 135–145)
Total Bilirubin: 0.6 mg/dL (ref 0.3–1.2)
Total Protein: 6.9 g/dL (ref 6.0–8.3)

## 2010-02-04 ENCOUNTER — Ambulatory Visit: Payer: Self-pay | Admitting: Internal Medicine

## 2010-02-04 DIAGNOSIS — K117 Disturbances of salivary secretion: Secondary | ICD-10-CM | POA: Insufficient documentation

## 2010-02-13 ENCOUNTER — Telehealth: Payer: Self-pay | Admitting: Internal Medicine

## 2010-03-19 ENCOUNTER — Telehealth: Payer: Self-pay | Admitting: Internal Medicine

## 2010-04-01 ENCOUNTER — Telehealth: Payer: Self-pay | Admitting: Internal Medicine

## 2010-04-08 ENCOUNTER — Telehealth: Payer: Self-pay | Admitting: Internal Medicine

## 2010-04-08 ENCOUNTER — Ambulatory Visit: Payer: Self-pay | Admitting: Internal Medicine

## 2010-04-08 DIAGNOSIS — R5383 Other fatigue: Secondary | ICD-10-CM | POA: Insufficient documentation

## 2010-04-08 DIAGNOSIS — M2550 Pain in unspecified joint: Secondary | ICD-10-CM | POA: Insufficient documentation

## 2010-04-08 DIAGNOSIS — N390 Urinary tract infection, site not specified: Secondary | ICD-10-CM | POA: Insufficient documentation

## 2010-04-08 LAB — CONVERTED CEMR LAB
ALT: 23 units/L (ref 0–35)
AST: 21 units/L (ref 0–37)
Albumin: 3.5 g/dL (ref 3.5–5.2)
Alkaline Phosphatase: 70 units/L (ref 39–117)
BUN: 95 mg/dL (ref 6–23)
Basophils Absolute: 0 10*3/uL (ref 0.0–0.1)
Basophils Relative: 0.1 % (ref 0.0–3.0)
Bilirubin Urine: NEGATIVE
Bilirubin, Direct: 0.1 mg/dL (ref 0.0–0.3)
CO2: 27 meq/L (ref 19–32)
Calcium: 9.6 mg/dL (ref 8.4–10.5)
Chloride: 95 meq/L — ABNORMAL LOW (ref 96–112)
Creatinine, Ser: 2.4 mg/dL — ABNORMAL HIGH (ref 0.4–1.2)
Eosinophils Absolute: 0.2 10*3/uL (ref 0.0–0.7)
Eosinophils Relative: 1.5 % (ref 0.0–5.0)
GFR calc non Af Amer: 22.03 mL/min (ref >60)
Glucose, Bld: 139 mg/dL — ABNORMAL HIGH (ref 70–99)
HCT: 32.1 % — ABNORMAL LOW (ref 36.0–46.0)
Hemoglobin, Urine: NEGATIVE
Hemoglobin: 11.1 g/dL — ABNORMAL LOW (ref 12.0–15.0)
Ketones, ur: NEGATIVE mg/dL
Lymphocytes Relative: 13.9 % (ref 12.0–46.0)
Lymphs Abs: 2 10*3/uL (ref 0.7–4.0)
MCHC: 34.5 g/dL (ref 30.0–36.0)
MCV: 92.7 fL (ref 78.0–100.0)
Monocytes Absolute: 1.2 10*3/uL — ABNORMAL HIGH (ref 0.1–1.0)
Monocytes Relative: 8.3 % (ref 3.0–12.0)
Neutro Abs: 11.1 10*3/uL — ABNORMAL HIGH (ref 1.4–7.7)
Neutrophils Relative %: 76.2 % (ref 43.0–77.0)
Nitrite: NEGATIVE
Platelets: 190 10*3/uL (ref 150.0–400.0)
Potassium: 6.1 meq/L (ref 3.5–5.1)
RBC: 3.46 M/uL — ABNORMAL LOW (ref 3.87–5.11)
RDW: 13.5 % (ref 11.5–14.6)
Sodium: 132 meq/L — ABNORMAL LOW (ref 135–145)
Specific Gravity, Urine: 1.025 (ref 1.000–1.030)
TSH: 3.44 microintl units/mL (ref 0.35–5.50)
Total Bilirubin: 0.5 mg/dL (ref 0.3–1.2)
Total Protein, Urine: NEGATIVE mg/dL
Total Protein: 6.4 g/dL (ref 6.0–8.3)
Urine Glucose: NEGATIVE mg/dL
Urobilinogen, UA: 0.2 (ref 0.0–1.0)
WBC: 14.6 10*3/uL — ABNORMAL HIGH (ref 4.5–10.5)
pH: 5 (ref 5.0–8.0)

## 2010-04-09 ENCOUNTER — Telehealth: Payer: Self-pay | Admitting: Internal Medicine

## 2010-04-10 ENCOUNTER — Telehealth (INDEPENDENT_AMBULATORY_CARE_PROVIDER_SITE_OTHER): Payer: Self-pay | Admitting: *Deleted

## 2010-04-10 ENCOUNTER — Ambulatory Visit: Payer: Self-pay | Admitting: Internal Medicine

## 2010-04-10 LAB — CONVERTED CEMR LAB
BUN: 41 mg/dL — ABNORMAL HIGH (ref 6–23)
Bilirubin Urine: NEGATIVE
CO2: 28 meq/L (ref 19–32)
Calcium: 9.4 mg/dL (ref 8.4–10.5)
Chloride: 101 meq/L (ref 96–112)
Creatinine, Ser: 1.1 mg/dL (ref 0.4–1.2)
GFR calc non Af Amer: 54.19 mL/min (ref >60)
Glucose, Bld: 124 mg/dL — ABNORMAL HIGH (ref 70–99)
Ketones, ur: NEGATIVE mg/dL
Leukocytes, UA: NEGATIVE
Nitrite: NEGATIVE
Potassium: 5.5 meq/L — ABNORMAL HIGH (ref 3.5–5.1)
Sodium: 136 meq/L (ref 135–145)
Specific Gravity, Urine: <=1.005 (ref 1.000–1.030)
Total Protein, Urine: NEGATIVE mg/dL
Urine Glucose: NEGATIVE mg/dL
Urobilinogen, UA: 0.2 (ref 0.0–1.0)
pH: 5.5 (ref 5.0–8.0)

## 2010-04-13 ENCOUNTER — Ambulatory Visit: Payer: Self-pay | Admitting: Internal Medicine

## 2010-04-22 ENCOUNTER — Telehealth: Payer: Self-pay | Admitting: Internal Medicine

## 2010-04-22 ENCOUNTER — Ambulatory Visit (HOSPITAL_COMMUNITY): Admission: RE | Admit: 2010-04-22 | Discharge: 2010-04-22 | Payer: Self-pay | Admitting: Internal Medicine

## 2010-04-29 ENCOUNTER — Encounter: Payer: Self-pay | Admitting: Internal Medicine

## 2010-05-13 ENCOUNTER — Telehealth: Payer: Self-pay | Admitting: Internal Medicine

## 2010-06-10 ENCOUNTER — Encounter: Payer: Self-pay | Admitting: Internal Medicine

## 2010-06-12 ENCOUNTER — Telehealth: Payer: Self-pay | Admitting: Internal Medicine

## 2010-06-28 ENCOUNTER — Encounter: Payer: Self-pay | Admitting: Internal Medicine

## 2010-07-06 ENCOUNTER — Telehealth: Payer: Self-pay | Admitting: Internal Medicine

## 2010-07-08 ENCOUNTER — Ambulatory Visit: Payer: Self-pay | Admitting: Internal Medicine

## 2010-07-08 LAB — CONVERTED CEMR LAB
BUN: 34 mg/dL — ABNORMAL HIGH (ref 6–23)
CO2: 29 meq/L (ref 19–32)
Calcium: 10.1 mg/dL (ref 8.4–10.5)
Chloride: 100 meq/L (ref 96–112)
Creatinine, Ser: 1 mg/dL (ref 0.4–1.2)
GFR calc non Af Amer: 58.41 mL/min (ref >60)
Glucose, Bld: 125 mg/dL — ABNORMAL HIGH (ref 70–99)
Hgb A1c MFr Bld: 6.2 % (ref 4.6–6.5)
Potassium: 5.6 meq/L — ABNORMAL HIGH (ref 3.5–5.1)
Sodium: 135 meq/L (ref 135–145)

## 2010-07-15 ENCOUNTER — Ambulatory Visit: Payer: Self-pay | Admitting: Internal Medicine

## 2010-07-15 DIAGNOSIS — E875 Hyperkalemia: Secondary | ICD-10-CM | POA: Insufficient documentation

## 2010-08-06 ENCOUNTER — Ambulatory Visit: Payer: Self-pay | Admitting: Internal Medicine

## 2010-08-06 DIAGNOSIS — R509 Fever, unspecified: Secondary | ICD-10-CM | POA: Insufficient documentation

## 2010-08-06 LAB — CONVERTED CEMR LAB
BUN: 36 mg/dL — ABNORMAL HIGH (ref 6–23)
Basophils Absolute: 0 10*3/uL (ref 0.0–0.1)
Basophils Relative: 0.2 % (ref 0.0–3.0)
Bilirubin Urine: NEGATIVE
CO2: 29 meq/L (ref 19–32)
Calcium: 10.1 mg/dL (ref 8.4–10.5)
Chloride: 98 meq/L (ref 96–112)
Creatinine, Ser: 1.2 mg/dL (ref 0.4–1.2)
Eosinophils Absolute: 0.1 10*3/uL (ref 0.0–0.7)
Eosinophils Relative: 0.7 % (ref 0.0–5.0)
GFR calc non Af Amer: 47.14 mL/min (ref >60)
Glucose, Bld: 74 mg/dL (ref 70–99)
HCT: 36.8 % (ref 36.0–46.0)
Hemoglobin, Urine: NEGATIVE
Hemoglobin: 12.8 g/dL (ref 12.0–15.0)
Ketones, ur: NEGATIVE mg/dL
Leukocytes, UA: NEGATIVE
Lymphocytes Relative: 10 % — ABNORMAL LOW (ref 12.0–46.0)
Lymphs Abs: 1.6 10*3/uL (ref 0.7–4.0)
MCHC: 34.6 g/dL (ref 30.0–36.0)
MCV: 91.4 fL (ref 78.0–100.0)
Monocytes Absolute: 1 10*3/uL (ref 0.1–1.0)
Monocytes Relative: 6.6 % (ref 3.0–12.0)
Neutro Abs: 13 10*3/uL — ABNORMAL HIGH (ref 1.4–7.7)
Neutrophils Relative %: 82.5 % — ABNORMAL HIGH (ref 43.0–77.0)
Nitrite: NEGATIVE
Platelets: 226 10*3/uL (ref 150.0–400.0)
Potassium: 5.1 meq/L (ref 3.5–5.1)
RBC: 4.03 M/uL (ref 3.87–5.11)
RDW: 13.1 % (ref 11.5–14.6)
Sodium: 136 meq/L (ref 135–145)
Specific Gravity, Urine: 1.01 (ref 1.000–1.030)
Total Protein, Urine: NEGATIVE mg/dL
Urine Glucose: NEGATIVE mg/dL
Urobilinogen, UA: 0.2 (ref 0.0–1.0)
WBC: 15.8 10*3/uL — ABNORMAL HIGH (ref 4.5–10.5)
pH: 5 (ref 5.0–8.0)

## 2010-08-12 ENCOUNTER — Ambulatory Visit: Payer: Self-pay | Admitting: Internal Medicine

## 2010-08-12 LAB — CONVERTED CEMR LAB
BUN: 28 mg/dL — ABNORMAL HIGH (ref 6–23)
CO2: 27 meq/L (ref 19–32)
Calcium: 9.2 mg/dL (ref 8.4–10.5)
Chloride: 103 meq/L (ref 96–112)
Creatinine, Ser: 1 mg/dL (ref 0.4–1.2)
GFR calc non Af Amer: 62.58 mL/min (ref >60)
Glucose, Bld: 165 mg/dL — ABNORMAL HIGH (ref 70–99)
Hgb A1c MFr Bld: 6.5 % (ref 4.6–6.5)
Potassium: 4.5 meq/L (ref 3.5–5.1)
Sodium: 137 meq/L (ref 135–145)

## 2010-08-18 ENCOUNTER — Telehealth: Payer: Self-pay | Admitting: Internal Medicine

## 2010-10-16 ENCOUNTER — Telehealth: Payer: Self-pay | Admitting: Internal Medicine

## 2010-10-21 ENCOUNTER — Ambulatory Visit: Payer: Self-pay | Admitting: Internal Medicine

## 2010-10-21 LAB — CONVERTED CEMR LAB
ALT: 18 units/L (ref 0–35)
AST: 17 units/L (ref 0–37)
Albumin: 3.8 g/dL (ref 3.5–5.2)
Alkaline Phosphatase: 66 units/L (ref 39–117)
BUN: 26 mg/dL — ABNORMAL HIGH (ref 6–23)
Basophils Absolute: 0.1 10*3/uL (ref 0.0–0.1)
Basophils Relative: 0.8 % (ref 0.0–3.0)
Bilirubin Urine: NEGATIVE
Bilirubin, Direct: 0.1 mg/dL (ref 0.0–0.3)
CO2: 30 meq/L (ref 19–32)
Calcium: 9.8 mg/dL (ref 8.4–10.5)
Chloride: 103 meq/L (ref 96–112)
Cholesterol: 176 mg/dL (ref 0–200)
Creatinine, Ser: 0.8 mg/dL (ref 0.4–1.2)
Eosinophils Absolute: 0.2 10*3/uL (ref 0.0–0.7)
Eosinophils Relative: 3.1 % (ref 0.0–5.0)
GFR calc non Af Amer: 74.86 mL/min (ref >60)
Glucose, Bld: 101 mg/dL — ABNORMAL HIGH (ref 70–99)
HCT: 36.1 % (ref 36.0–46.0)
HDL: 65.8 mg/dL (ref >39.00)
Hemoglobin, Urine: NEGATIVE
Hemoglobin: 12.3 g/dL (ref 12.0–15.0)
Ketones, ur: NEGATIVE mg/dL
LDL Cholesterol: 105 mg/dL — ABNORMAL HIGH (ref 0–99)
Leukocytes, UA: NEGATIVE
Lymphocytes Relative: 29.2 % (ref 12.0–46.0)
Lymphs Abs: 2 10*3/uL (ref 0.7–4.0)
MCHC: 34 g/dL (ref 30.0–36.0)
MCV: 93 fL (ref 78.0–100.0)
Monocytes Absolute: 0.3 10*3/uL (ref 0.1–1.0)
Monocytes Relative: 4.7 % (ref 3.0–12.0)
Neutro Abs: 4.3 10*3/uL (ref 1.4–7.7)
Neutrophils Relative %: 62.2 % (ref 43.0–77.0)
Nitrite: NEGATIVE
Platelets: 213 10*3/uL (ref 150.0–400.0)
Potassium: 4.8 meq/L (ref 3.5–5.1)
RBC: 3.88 M/uL (ref 3.87–5.11)
RDW: 13.1 % (ref 11.5–14.6)
Sodium: 140 meq/L (ref 135–145)
Specific Gravity, Urine: 1.01 (ref 1.000–1.030)
TSH: 0.98 microintl units/mL (ref 0.35–5.50)
Total Bilirubin: 0.6 mg/dL (ref 0.3–1.2)
Total CHOL/HDL Ratio: 3
Total Protein, Urine: NEGATIVE mg/dL
Total Protein: 6.5 g/dL (ref 6.0–8.3)
Triglycerides: 27 mg/dL (ref 0.0–149.0)
Urine Glucose: NEGATIVE mg/dL
Urobilinogen, UA: 0.2 (ref 0.0–1.0)
VLDL: 5.4 mg/dL (ref 0.0–40.0)
WBC: 6.9 10*3/uL (ref 4.5–10.5)
pH: 5.5 (ref 5.0–8.0)

## 2010-10-28 ENCOUNTER — Ambulatory Visit: Payer: Self-pay | Admitting: Internal Medicine

## 2010-10-28 DIAGNOSIS — M949 Disorder of cartilage, unspecified: Secondary | ICD-10-CM

## 2010-10-28 DIAGNOSIS — M899 Disorder of bone, unspecified: Secondary | ICD-10-CM | POA: Insufficient documentation

## 2010-11-12 ENCOUNTER — Ambulatory Visit: Payer: Self-pay | Admitting: Internal Medicine

## 2010-11-12 ENCOUNTER — Encounter: Payer: Self-pay | Admitting: Internal Medicine

## 2011-01-03 ENCOUNTER — Encounter: Payer: Self-pay | Admitting: Internal Medicine

## 2011-01-12 NOTE — Miscellaneous (Signed)
Summary: cyclobenzaprine  Clinical Lists Changes  Medications: Added new medication of CYCLOBENZAPRINE HCL 10 MG  TABS (CYCLOBENZAPRINE HCL) three times a day as needed - Signed Rx of CYCLOBENZAPRINE HCL 10 MG  TABS (CYCLOBENZAPRINE HCL) three times a day as needed;  #270 x 1;  Signed;  Entered by: Doralee Albino;  Authorized by: Cassandria Anger MD;  Method used: Telephoned to [facility], , , [place]  , Ph: , Fax: [phone]    Prescriptions: CYCLOBENZAPRINE HCL 10 MG  TABS (CYCLOBENZAPRINE HCL) three times a day as needed  #270 x 1   Entered by:   Doralee Albino   Authorized by:   Cassandria Anger MD   Signed by:   Doralee Albino on 01/31/2008   Method used:   Telephoned to ...       [facility]             , [place]         Ph:        Fax: [phone]   RxID:   [id number]

## 2011-01-12 NOTE — Progress Notes (Signed)
Summary: cipro rx  Phone Note Outgoing Call   Summary of Call: cipro prescription. Patient denied receiving earlier today. Initial call taken by: Ernestene Mention,  April 08, 2010 1:44 PM    Prescriptions: CIPROFLOXACIN HCL 500 MG TABS (CIPROFLOXACIN HCL) 1 by mouth two times a day (take once  today)  #20 x 1   Entered by:   Ernestene Mention   Authorized by:   Cassandria Anger MD   Signed by:   Ernestene Mention on 04/08/2010   Method used:   Electronically to        [facility] (retail)       [address]       [place]       [place], [place]  [address]       Ph: [phone]       Fax: [phone]   RxID:   [id number]

## 2011-01-12 NOTE — Assessment & Plan Note (Signed)
Summary: MED REFILL/CHECK UP/$50/PN   Vital Signs:  Patient profile:   60 year old female Height:      62 inches Weight:      187 pounds BMI:     34.33 Temp:     97.5 degrees F oral Pulse rate:   90 / minute BP sitting:   110 / 66  (left arm)  Vitals Entered By: Doralee Albino (Apr 30, 2009 3:36 PM)  History of Present Illness: The patient presents for a follow up of hypertension, diabetes, hyperlipidemia, OA   Current Medications (verified): 1)  Lisinopril 40 Mg  Tabs (Lisinopril) .... Once Daily 2)  Tizanidine Hcl 4 Mg  Tabs (Tizanidine Hcl) .Marland Kitchen.. 1-2 Once Daily 3)  Prilosec 20 Mg  Cpdr (Omeprazole) .... Once Daily 4)  Celebrex 200 Mg  Caps (Celecoxib) .... Two Times A Day 5)  Lasix 40 Mg  Tabs (Furosemide) .Marland Kitchen.. 1 Qd As Needed 6)  Klor-Con M10 10 Meq  Tbcr (Potassium Chloride Crys Cr) .... Once Daily As Needed 7)  Oxycontin 20 Mg  Tb12 (Oxycodone Hcl) .Marland Kitchen.. 1 Tab By Mouth Two Times A Day Please,  Fill On or After 03/29/2009 8)  Oxycodone Hcl 15 Mg  Tabs (Oxycodone Hcl) .Marland Kitchen.. 1 By Mouth Qid As Needed Pain. Please,  Fill On or After 03/29/2009 9)  Metformin Hcl 1000 Mg Tabs (Metformin Hcl) .... Take 1 Tablet By Mouth Two Times A Day 10)  Wellbutrin Sr 150 Mg Tb12 (Bupropion Hcl) .Marland Kitchen.. 1 Po Bid 11)  Cyclobenzaprine Hcl 10 Mg  Tabs (Cyclobenzaprine Hcl) .... Three Times A Day As Needed For Back Pain 12)  Vitamin D3 1000 Unit  Tabs (Cholecalciferol) .... Once Daily 13)  Detrol La 4 Mg Cp24 (Tolterodine Tartrate) .Marland Kitchen.. 1 Tablet By Mouth Daily 14)  Glimepiride 1 Mg Tabs (Glimepiride) .Marland Kitchen.. 1 Po Qd 15)  Onetouch Ultra Test   Strp (Glucose Blood) .... Use Once Daily To Two Times A Day 16)  Onetouch Ultrasoft Lancets   Misc (Lancets) .... Use Once Daily To Two Times A Day 17)  Lorazepam 2 Mg Tabs (Lorazepam) .... 1/2 or 1 By Mouth Two Times A Day Prn 18)  Femhrt Low Dose 0.5-2.5 Mg-Mcg Tabs (Norethindrone-Eth Estradiol) .... Once Daily  Allergies: 1)  ! Erythromycin (Erythromycin  Base)  Past History:  Past Medical History:    Diabetes mellitus, type II    GERD    Hypertension    IBS    FMS    Anxiety    Depression    Low back pain    Osteoarthritis    Foramen magnum ependymoma 2003    L eye vitreous humor liquification     (01/15/2009)  Past Surgical History:    Foramen magnum ependymoma  surgery 2003 Dr Rita Ohara    Total knee replacement L 2008, R 2009  Dr Maureen Ralphs     (04/10/2008)  Family History:    Family History Diabetes 1st degree relative     (11/08/2007)  Social History:    Occupation: RN    Single    Alcohol use-no     (11/08/2007)  Physical Exam  General:  overweight-appearing.   Nose:  External nasal examination shows no deformity or inflammation. Nasal mucosa are pink and moist without lesions or exudates. Mouth:  Oral mucosa and oropharynx without lesions or exudates.  Teeth in good repair. Neck:  No deformities, masses, or tenderness noted. Lungs:  Normal respiratory effort, chest expands symmetrically. Lungs are clear to auscultation, no  crackles or wheezes. Heart:  Normal rate and regular rhythm. S1 and S2 normal without gallop, murmur, click, rub or other extra sounds. Abdomen:  Bowel sounds positive,abdomen soft and non-tender without masses, organomegaly or hernias noted. Genitalia:  Normal introitus for age, no external lesions, no vaginal discharge, mucosa pink and moist, no vaginal or cervical lesions, no vaginal atrophy, no friaility or hemorrhage, normal uterus size and position, no adnexal masses or tenderness Extremities:  No clubbing, cyanosis, edema, or deformity noted with normal full range of motion of all joints.   Neurologic:  No cranial nerve deficits noted. Station and gait are normal. Plantar reflexes are down-going bilaterally. DTRs are symmetrical throughout. Sensory, motor and coordinative functions appear intact. Skin:  Intact without suspicious lesions or rashes Psych:  Oriented X3 and depressed affect.      Impression & Recommendations:  Problem # 1:  HYPERTENSION (ICD-401.9) Assessment Improved  Her updated medication list for this problem includes:    Lisinopril 40 Mg Tabs (Lisinopril) ..... Once daily    Lasix 40 Mg Tabs (Furosemide) .Marland Kitchen... 1 qd as needed  Problem # 2:  DIABETES MELLITUS, TYPE II (ICD-250.00) Assessment: Improved  Her updated medication list for this problem includes:    Lisinopril 40 Mg Tabs (Lisinopril) ..... Once daily    Metformin Hcl 1000 Mg Tabs (Metformin hcl) .Marland Kitchen... Take 1 tablet by mouth two times a day    Glimepiride 1 Mg Tabs (Glimepiride) .Marland Kitchen... 1 po qd  Problem # 3:  HYPERCHOLESTEROLEMIA (ICD-272.0) On prescription therapy  Problem # 4:  ANXIETY (ICD-300.00) Assessment: Unchanged  Her updated medication list for this problem includes:    Wellbutrin Sr 150 Mg Tb12 (Bupropion hcl) .Marland Kitchen... 1 po bid    Lorazepam 2 Mg Tabs (Lorazepam) .Marland Kitchen... 1/2 or 1 by mouth two times a day prn  Problem # 5:  GERD (ICD-530.81) Assessment: Comment Only  Her updated medication list for this problem includes:    Prilosec 20 Mg Cpdr (Omeprazole) ..... Once daily  Complete Medication List: 1)  Lisinopril 40 Mg Tabs (Lisinopril) .... Once daily 2)  Tizanidine Hcl 4 Mg Tabs (Tizanidine hcl) .Marland Kitchen.. 1-2 once daily 3)  Prilosec 20 Mg Cpdr (Omeprazole) .... Once daily 4)  Celebrex 200 Mg Caps (Celecoxib) .... Two times a day 5)  Lasix 40 Mg Tabs (Furosemide) .Marland Kitchen.. 1 qd as needed 6)  Klor-con M10 10 Meq Tbcr (Potassium chloride crys cr) .... Once daily as needed 7)  Oxycontin 20 Mg Tb12 (Oxycodone hcl) .Marland Kitchen.. 1 tab by mouth two times a day please,  fill on or after 05/29/2009 8)  Oxycodone Hcl 15 Mg Tabs (Oxycodone hcl) .Marland Kitchen.. 1 by mouth qid as needed pain. please,  fill on or after 05/29/2009 9)  Metformin Hcl 1000 Mg Tabs (Metformin hcl) .... Take 1 tablet by mouth two times a day 10)  Wellbutrin Sr 150 Mg Tb12 (Bupropion hcl) .Marland Kitchen.. 1 po bid 11)  Cyclobenzaprine Hcl 10 Mg Tabs  (Cyclobenzaprine hcl) .... Three times a day as needed for back pain 12)  Vitamin D3 1000 Unit Tabs (Cholecalciferol) .... Once daily 13)  Detrol La 4 Mg Cp24 (Tolterodine tartrate) .Marland Kitchen.. 1 tablet by mouth daily 14)  Glimepiride 1 Mg Tabs (Glimepiride) .Marland Kitchen.. 1 po qd 15)  Onetouch Ultra Test Strp (Glucose blood) .... Use once daily to two times a day 16)  Onetouch Ultrasoft Lancets Misc (Lancets) .... Use once daily to two times a day 17)  Lorazepam 2 Mg Tabs (Lorazepam) .... 1/2 or  1 by mouth two times a day prn 18)  Femhrt Low Dose 0.5-2.5 Mg-mcg Tabs (Norethindrone-eth estradiol) .... Once daily 19)  Lunesta 3 Mg Tabs (Eszopiclone) .... One by mouth at bedtime as needed  Patient Instructions: 1)  Please schedule a follow-up appointment in 3 months. 2)  BMP prior to visit, ICD-9: 3)  Hepatic Panel prior to visit, ICD-9: 4)  Lipid Panel prior to visit, ICD-9: 5)  TSH prior to visit, ICD-9: 6)  HbgA1C prior to visit, ICD-9: 7)  "Green Smoothies Diet"  Robin Oppenshaw Prescriptions: LORAZEPAM 2 MG TABS (LORAZEPAM) 1/2 or 1 by mouth two times a day prn  #60 x 2   Entered and Authorized by:   Cassandria Anger MD   Signed by:   Cassandria Anger MD on 04/30/2009   Method used:   Print then Give to Patient   RxID:   [id number] LISINOPRIL 40 MG  TABS (LISINOPRIL) once daily  #90 x 3   Entered and Authorized by:   Cassandria Anger MD   Signed by:   Cassandria Anger MD on 04/30/2009   Method used:   Electronically to        [facility] ([facility]ail)       [address]       [place], [place]  [address]       Ph: [phone]       Fax: [phone]   RxID:   [id number] OXYCODONE HCL 15 MG  TABS (OXYCODONE HCL) 1 by mouth qid as needed pain. Please,  fill on or after 05/29/2009  #120 x 0   Entered and Authorized by:   Cassandria Anger MD   Signed by:   Cassandria Anger MD on 04/30/2009   Method used:   Print then  Give to Patient   RxID:   [id number] OXYCONTIN 20 MG  TB12 (OXYCODONE HCL) 1 tab by mouth two times a day Please,  fill on or after 05/29/2009  #60 x 0   Entered and Authorized by:   Cassandria Anger MD   Signed by:   Cassandria Anger MD on 04/30/2009   Method used:   Print then Give to Patient   RxID:   [id number] OXYCODONE HCL 15 MG  TABS (OXYCODONE HCL) 1 by mouth qid as needed pain. Please,  fill on or after 04/28/2009  #120 x 0   Entered and Authorized by:   Cassandria Anger MD   Signed by:   Cassandria Anger MD on 04/30/2009   Method used:   Print then Give to Patient   RxID:   [id number] OXYCONTIN 20 MG  TB12 (OXYCODONE HCL) 1 tab by mouth two times a day Please,  fill on or after 04/28/2009  #60 x 0   Entered and Authorized by:   Cassandria Anger MD   Signed by:   Cassandria Anger MD on 04/30/2009   Method used:   Print then Give to Patient   RxID:   [id number] LUNESTA 3 MG TABS (ESZOPICLONE) one by mouth at bedtime as needed  #30 x 6   Entered and Authorized by:   Cassandria Anger MD   Signed by:   Cassandria Anger MD on 04/30/2009   Method used:   Print then Give to Patient   RxID:   [id number]

## 2011-01-12 NOTE — Progress Notes (Signed)
Summary: NEED OXYCODONE RX  Phone Note Call from Patient Call back at Home Phone [phone]   Caller: Patient Call For: Cassandria Anger MD Summary of Call: patient need rx for oxycodone also. Initial call taken by: Linden Dolin,  June 05, 2008 4:11 PM  Follow-up for Phone Call        OK to ref Follow-up by: Cassandria Anger MD,  June 06, 2008 1:06 PM  Additional Follow-up for Phone Call Additional follow up Details #1::        Please give written signed rx for pt to pick up. thanks Additional Follow-up by: Estell Harpin CMA,  June 06, 2008 2:43 PM    Additional Follow-up for Phone Call Additional follow up Details #2::    OK Follow-up by: Cassandria Anger MD,  June 07, 2008 1:09 PM  Additional Follow-up for Phone Call Additional follow up Details #3:: Details for Additional Follow-up Action Taken: Unable to reach patient on home phone.  Left voicemail message that Rx is available to pick up at patient's convenience.  New/Updated Medications: OXYCODONE HCL 15 MG  TABS (OXYCODONE HCL) 1 by mouth qid as needed pain. Please,  fill on or after   6/22/9   Prescriptions: OXYCODONE HCL 15 MG  TABS (OXYCODONE HCL) 1 by mouth qid as needed pain. Please,  fill on or after   6/22/9  #120 x 0   Entered and Authorized by:   Cassandria Anger MD   Signed by:   Cassandria Anger MD on 06/07/2008   Method used:   Print then Give to Patient   RxID:   [id number]

## 2011-01-12 NOTE — Assessment & Plan Note (Signed)
Summary: 3 MO ROV/NWS   Vital Signs:  Patient Profile:   60 Years Old Female Weight:      232 pounds Temp:     97.8 degrees F oral Pulse rate:   93 / minute BP sitting:   136 / 79  (left arm)  Vitals Entered By: Doralee Albino (April 10, 2008 10:44 AM)             Is Patient Diabetic? No     Chief Complaint:  Multiple medical problems or concerns.  History of Present Illness: The patient presents for a follow up of hypertension, diabetes, hyperlipidemia, OA     Current Allergies (reviewed today): ! ERYTHROMYCIN (ERYTHROMYCIN BASE)  Past Medical History:    Reviewed history from 11/08/2007 and no changes required:       Diabetes mellitus, type II       GERD       Hypertension       IBS       FMS       Anxiety       Depression       Low back pain       Osteoarthritis       Foramen magnum ependymoma 2003  Past Surgical History:    Foramen magnum ependymoma  surgery 2003 Dr Rita Ohara    Total knee replacement L 2008, R 2009  Dr Maureen Ralphs   Family History:    Reviewed history from 11/08/2007 and no changes required:       Family History Diabetes 1st degree relative  Social History:    Reviewed history from 11/08/2007 and no changes required:       Occupation: Therapist, sports       Single       Alcohol use-no    Review of Systems  The patient denies fever, weight gain, dyspnea on exhertion, peripheral edema, melena, and hematuria.     Physical Exam  General:     overweight-appearing.   Head:     Normocephalic and atraumatic without obvious abnormalities. No apparent alopecia or balding. Ears:     External ear exam shows no significant lesions or deformities.  Otoscopic examination reveals clear canals, tympanic membranes are intact bilaterally without bulging, retraction, inflammation or discharge. Hearing is grossly normal bilaterally. Nose:     External nasal examination shows no deformity or inflammation. Nasal mucosa are pink and moist without lesions or  exudates. Mouth:     Oral mucosa and oropharynx without lesions or exudates.  Teeth in good repair. Neck:     No deformities, masses, or tenderness noted. Lungs:     Normal respiratory effort, chest expands symmetrically. Lungs are clear to auscultation, no crackles or wheezes. Heart:     Normal rate and regular rhythm. S1 and S2 normal without gallop, murmur, click, rub or other extra sounds. Abdomen:     Bowel sounds positive,abdomen soft and non-tender without masses, organomegaly or hernias noted. Msk:     Lumbar-sacral spine is tender to palpation over paraspinal muscles and painfull with the ROM R shin is thinner Pulses:     R and L carotid,radial,femoral,dorsalis pedis and posterior tibial pulses are full and equal bilaterally Extremities:     trace left pedal edema.   Neurologic:     No cranial nerve deficits noted. Station and gait are normal. Plantar reflexes are down-going bilaterally. DTRs are symmetrical throughout. Sensory, motor and coordinative functions appear intact. Skin:     Intact without suspicious lesions or rashes  Psych:     Oriented X3, normally interactive, and good eye contact.      Impression & Recommendations:  Problem # 1:  KNEE PAIN (ICD-719.46) B Assessment: Improved Will try to reduce pain Rx The following medications were removed from the medication list:    Oxycodone Hcl 30 Mg Tabs (Oxycodone hcl) .Marland Kitchen... 1 tab by mouth qid as needed pain please,  fill on or after 4/7/9  Her updated medication list for this problem includes:    Tizanidine Hcl 4 Mg Tabs (Tizanidine hcl) .Marland Kitchen... 1-2 once daily    Celebrex 200 Mg Caps (Celecoxib) .Marland Kitchen..Marland Kitchen Two times a day    Oxycontin 20 Mg Tb12 (Oxycodone hcl) .Marland Kitchen... 1 tab by mouth two times a day please,  fill on or after 5/22/9    Cyclobenzaprine Hcl 10 Mg Tabs (Cyclobenzaprine hcl) .Marland Kitchen... Three times a day as needed    Oxycodone Hcl 15 Mg Tabs (Oxycodone hcl) .Marland Kitchen... 1 by mouth qid as needed pain. please,  fill on or  after   5/22/9   Problem # 2:  LOW BACK PAIN (ICD-724.2)  The following medications were removed from the medication list:    Oxycodone Hcl 30 Mg Tabs (Oxycodone hcl) .Marland Kitchen... 1 tab by mouth qid as needed pain please,  fill on or after 4/7/9  Her updated medication list for this problem includes:    Tizanidine Hcl 4 Mg Tabs (Tizanidine hcl) .Marland Kitchen... 1-2 once daily    Celebrex 200 Mg Caps (Celecoxib) .Marland Kitchen..Marland Kitchen Two times a day    Oxycontin 20 Mg Tb12 (Oxycodone hcl) .Marland Kitchen... 1 tab by mouth two times a day please,  fill on or after 5/22/9    Cyclobenzaprine Hcl 10 Mg Tabs (Cyclobenzaprine hcl) .Marland Kitchen... Three times a day as needed    Oxycodone Hcl 15 Mg Tabs (Oxycodone hcl) .Marland Kitchen... 1 by mouth qid as needed pain. please,  fill on or after   5/22/9   Problem # 3:  HYPERTENSION (ICD-401.9)  Her updated medication list for this problem includes:    Lisinopril 40 Mg Tabs (Lisinopril) ..... Once daily    Lasix 40 Mg Tabs (Furosemide) .Marland Kitchen... 1 qd as needed   Problem # 4:  DIABETES MELLITUS, TYPE II (ICD-250.00)  Her updated medication list for this problem includes:    Lisinopril 40 Mg Tabs (Lisinopril) ..... Once daily    Metformin Hcl 1000 Mg Tabs (Metformin hcl) .Marland Kitchen... Take 1 tablet by mouth two times a day  Labs Reviewed: HgBA1c: 7.2 (04/08/2008)   Creat: 1.1 (04/08/2008)      Complete Medication List: 1)  Lisinopril 40 Mg Tabs (Lisinopril) .... Once daily 2)  Tizanidine Hcl 4 Mg Tabs (Tizanidine hcl) .Marland Kitchen.. 1-2 once daily 3)  Lorazepam 1 Mg Tabs (Lorazepam) .... Two times a day 4)  Prilosec 20 Mg Cpdr (Omeprazole) .... Once daily 5)  Celebrex 200 Mg Caps (Celecoxib) .... Two times a day 6)  Lasix 40 Mg Tabs (Furosemide) .Marland Kitchen.. 1 qd as needed 7)  Klor-con M10 10 Meq Tbcr (Potassium chloride crys cr) .... Once daily as needed 8)  Oxycontin 20 Mg Tb12 (Oxycodone hcl) .Marland Kitchen.. 1 tab by mouth two times a day please,  fill on or after 5/22/9 9)  Zolpidem Tartrate 10 Mg Tabs (Zolpidem tartrate) .... 1/2 or 1 by  mouth at hs prn 10)  Metformin Hcl 1000 Mg Tabs (Metformin hcl) .... Take 1 tablet by mouth two times a day 11)  Wellbutrin Sr 150 Mg Tb12 (Bupropion hcl) .Marland Kitchen.. 1 po  bid 12)  Cyclobenzaprine Hcl 10 Mg Tabs (Cyclobenzaprine hcl) .... Three times a day as needed 13)  Vitamin D3 1000 Unit Tabs (Cholecalciferol) .... Once daily 14)  Oxycodone Hcl 15 Mg Tabs (Oxycodone hcl) .Marland Kitchen.. 1 by mouth qid as needed pain. please,  fill on or after   5/22/9   Patient Instructions: 1)  Please schedule a follow-up appointment in 3 months. 2)  BMP prior to visit, ICD-9: 3)  HbgA1C prior to visit, ICD-9: 250.00    Prescriptions: OXYCONTIN 20 MG  TB12 (OXYCODONE HCL) 1 tab by mouth two times a day Please,  fill on or after 5/22/9  #60 x 0   Entered and Authorized by:   Cassandria Anger MD   Signed by:   Cassandria Anger MD on 04/10/2008   Method used:   Print then Give to Patient   RxID:   [id number] OXYCODONE HCL 15 MG  TABS (OXYCODONE HCL) 1 by mouth qid as needed pain. Please,  fill on or after   5/22/9  #120 x 0   Entered and Authorized by:   Cassandria Anger MD   Signed by:   Cassandria Anger MD on 04/10/2008   Method used:   Print then Give to Patient   RxID:   [id number] CYCLOBENZAPRINE HCL 10 MG  TABS (CYCLOBENZAPRINE HCL) three times a day as needed  #270 x 0   Entered and Authorized by:   Cassandria Anger MD   Signed by:   Cassandria Anger MD on 04/10/2008   Method used:   Print then Give to Patient   RxID:   [id number] METFORMIN HCL 1000 MG TABS (METFORMIN HCL) Take 1 tablet by mouth two times a day  #180 x 3   Entered and Authorized by:   Cassandria Anger MD   Signed by:   Cassandria Anger MD on 04/10/2008   Method used:   Print then Give to Patient   RxID:   [id number] PRILOSEC 20 MG  CPDR (OMEPRAZOLE) once daily  #90 x 3   Entered and Authorized by:   Cassandria Anger MD   Signed by:   Cassandria Anger MD on 04/10/2008   Method used:    Print then Give to Patient   RxID:   [id number] LISINOPRIL 40 MG  TABS (LISINOPRIL) once daily  #90 x 3   Entered and Authorized by:   Cassandria Anger MD   Signed by:   Cassandria Anger MD on 04/10/2008   Method used:   Print then Give to Patient   [id number]  ]

## 2011-01-12 NOTE — Progress Notes (Signed)
Summary: concerned patient  Phone Note Call from Patient Call back at Home Phone [phone]   Summary of Call: Patient called very concerned about yesterdays results and would like a urine culture added to her orders. Patient would also like to know about a neprology referral and how soon she should be seen. Patient also would like to know when MD wants her to follow up in office. Please advise. Initial call taken by: Ernestene Mention,  April 09, 2010 11:53 AM  Follow-up for Phone Call        I called - busy signal... Is she better? OK urine Cx RTC on Mon with UA and BMET Follow-up by: Cassandria Anger MD,  April 09, 2010 12:01 PM  Additional Follow-up for Phone Call Additional follow up Details #1::        Pt continues to c/o feeling same as at last office visit. She is worried that waiting until Monday is too long to repeat labs. Can she have labs tomorrow? Or another office visit?   What about nephrology referral? Is that necessary? If yes, she wants apt asap.   Additional Follow-up by: Charlsie Quest, CMA,  April 09, 2010 6:06 PM    Additional Follow-up for Phone Call Additional follow up Details #2::    OK UA and BMET today 599.0. OV w/results Follow-up by: Cassandria Anger MD,  April 10, 2010 7:39 AM  Additional Follow-up for Phone Call Additional follow up Details #3:: Details for Additional Follow-up Action Taken: Tried pt at work, she is not at work today. I also called primary  number listed above several times with no answer.  Pt informed, scheduled labs and office visit.......................Marland KitchenDestanee Cirrincione, CMA  April 10, 2010 2:31 PM  Additional Follow-up by: Charlynne Cousins CMA,  April 10, 2010 11:50 AM

## 2011-01-12 NOTE — Consult Note (Signed)
Summary: [facility]   Imported By: Phillis Knack 05/15/2010 14:55:39  _____________________________________________________________________  External Attachment:    Type:   Image     Comment:   External Document

## 2011-01-12 NOTE — Miscellaneous (Signed)
Summary: med. order  Clinical Lists Changes  Medications: Added new medication of ENTOCORT EC 3 MG XR24H-CAP (BUDESONIDE) Take 3 p.o. q.day - Signed Rx of ENTOCORT EC 3 MG XR24H-CAP (BUDESONIDE) Take 3 p.o. q.day;  #270 x 3;  Signed;  Entered by: Abel Presto RN;  Authorized by: Irene Shipper MD;  Method used: Electronically to [facility] [place], [place], [place]  [address], Ph: [phone], Fax: [phone]    Prescriptions: ENTOCORT EC 3 MG XR24H-CAP (BUDESONIDE) Take 3 p.o. q.day  #270 x 3   Entered by:   Abel Presto RN   Authorized by:   Irene Shipper MD   Signed by:   Abel Presto RN on 06/26/2009   Method used:   Electronically to        [facility] (retail)       [address]       [place]       [place], [place]  [address]       Ph: [phone]       Fax: [phone]   RxID:   [id number]

## 2011-01-12 NOTE — Assessment & Plan Note (Signed)
Summary: pt cant walk far pt hurts all over-lb   Vital Signs:  Patient profile:   60 year old female Height:      62 inches Weight:      192 pounds BMI:     35.24 O2 Sat:      94 % on Room air Temp:     98.1 degrees F oral Pulse rate:   101 / minute BP sitting:   100 / 50  (left arm)  Vitals Entered By: Ernestene Mention (April 08, 2010 8:16 AM)  O2 Flow:  Room air CC: C/O pain all over and cannot walk./kb Is Patient Diabetic? No Pain Assessment Patient in pain? yes     Location: all over Intensity: 7 Onset of pain  Monday after gym   Primary Care Provider:  [name], MD  CC:  C/O pain all over and cannot walk./kb.  History of Present Illness: C/o aches all over since Mon - sudden onset of tiredness and pain everywhere; could not work. She had a T100. A little better today.  Current Medications (verified): 1)  Oxycodone Hcl 15 Mg  Tabs (Oxycodone Hcl) .Marland Kitchen.. 1 By Mouth Qid As Needed Pain. Please,  Fill On or After 03/13/2010 2)  Oxycontin 20 Mg  Tb12 (Oxycodone Hcl) .Marland Kitchen.. 1 Tab By Mouth Two Times A Day Please,  Fill On or After 03/13/2010 3)  Cyclobenzaprine Hcl 10 Mg  Tabs (Cyclobenzaprine Hcl) .... Three Times A Day As Needed For Back Pain 4)  Lisinopril 40 Mg  Tabs (Lisinopril) .... Take 1/2  Tablet Daily 5)  Prilosec 20 Mg  Cpdr (Omeprazole) .... Once Daily 6)  Celebrex 200 Mg  Caps (Celecoxib) .... Two Times A Day 7)  Metformin Hcl 1000 Mg Tabs (Metformin Hcl) .... Take 1 Tablet By Mouth Two Times A Day 8)  Wellbutrin Sr 150 Mg Tb12 (Bupropion Hcl) .Marland Kitchen.. 1 Po Bid 9)  Vitamin D3 1000 Unit  Tabs (Cholecalciferol) .... Once Daily 10)  Glimepiride 1 Mg Tabs (Glimepiride) .Marland Kitchen.. 1 Po Qd 11)  Onetouch Ultra Test   Strp (Glucose Blood) .... Use Once Daily To Two Times A Day 12)  Onetouch Ultrasoft Lancets   Misc (Lancets) .... Use Once Daily To Two Times A Day 13)  Lorazepam 2 Mg Tabs (Lorazepam) .... 1/2 or 1 By Mouth Two Times A Day Prn 14)  Lidoderm 5 % Ptch (Lidocaine) .Marland Kitchen..  1 To 3 Patches Qday 15)  Fiber 625 Mg Tabs (Calcium Polycarbophil) .... Take 4 Daily 16)  Lomotil 2.5-0.025 Mg Tabs (Diphenoxylate-Atropine) .Marland Kitchen.. 1-2 By Mouth Q 4-6 Hours As Needed 17)  Estradiol 0.5 Mg Tabs (Estradiol) .Marland Kitchen.. 1 Every Other Day 18)  Medroxyprogesterone Acetate 2.5 Mg Tabs (Medroxyprogesterone Acetate) .Marland Kitchen.. 1 Every Other Day 19)  Oxybutynin Chloride 5 Mg Tabs (Oxybutynin Chloride) .Marland Kitchen.. 1 By Mouth Two Times A Day For Bladder  Allergies (verified): 1)  ! Erythromycin (Erythromycin Base)  Past History:  Social History: Last updated: 08/06/2009 Occupation: RN Single Alcohol use-no Patient is a former smoker. 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  The patient denies abdominal pain.    Physical Exam  General:  Well developed, well nourished, in mild  acute distress. Mouth:  No deformity or lesions, dentition normal. Erythematous throat mucosa and intranasal erythema.  Lungs:  Clear throughout to auscultation. Heart:  Regular rate and rhythm; no murmurs, rubs,  or bruits. Abdomen:  Soft, nontender and nondistended. No masses, hepatosplenomegaly or hernias noted. Normal bowel sounds. Msk:  Lumbar-sacral spine is tender to palpation over paraspinal muscles and painfull with the ROM  B knees w/post-op changes Neurologic:  Alert and  oriented x4;  grossly normal neurologically. Skin:  Intact without significant lesions or rashes. Psych:  Alert and cooperative. tearful and slightly anxious.     Impression & Recommendations:  Problem # 1:  ARTHRALGIA (ICD-719.40) due to #6 Assessment New To work 4/29 if  ok Orders: TLB-Udip ONLY (81003-UDIP) TLB-TSH (Thyroid Stimulating Hormone) (84443-TSH) TLB-CBC Platelet - w/Differential (85025-CBCD) TLB-BMP (Basic Metabolic Panel-BMET) (99991111) TLB-Hepatic/Liver Function Pnl (80076-HEPATIC) If worse: Take 40mg  qd for 3 days, then 20 mg qd for 3 days, then 10mg  qd for 6 days, then stop. Take pc.   Problem # 2:  FATIGUE (ICD-780.79) Assessment: New  Orders: TLB-Udip ONLY (81003-UDIP) TLB-TSH (Thyroid Stimulating Hormone) (84443-TSH) TLB-CBC Platelet - w/Differential (85025-CBCD) TLB-BMP (Basic Metabolic Panel-BMET) (99991111) TLB-Hepatic/Liver Function Pnl (80076-HEPATIC)  Problem # 3:  DIABETES MELLITUS, TYPE II (ICD-250.00) Assessment: Comment Only  Her updated medication list for this problem includes:    Lisinopril 40 Mg Tabs (Lisinopril) .Marland Kitchen... Take 1/2  tablet daily    Metformin Hcl 1000 Mg Tabs (Metformin hcl) .Marland Kitchen... Take 1 tablet by mouth two times a day    Glimepiride 1 Mg Tabs (Glimepiride) .Marland Kitchen... 1 po qd  Problem # 4:  HYPERTENSION (ICD-401.9) Assessment: Comment Only  Her updated medication list for this problem includes:    Lisinopril 40 Mg Tabs (Lisinopril) .Marland Kitchen... Take 1/2  tablet daily  Problem # 5:  XEROSTOMIA (ICD-527.7) Assessment: Deteriorated D/c oxybut  Problem # 6:  UTI (ICD-599.0) Assessment: New  The following medications were removed from the medication list:    Oxybutynin Chloride 5 Mg Tabs (Oxybutynin chloride) .Marland Kitchen... 1 by mouth two times a day for bladder Her updated medication list for this problem includes:    Gelnique 10 % Gel (Oxybutynin chloride) .Marland Kitchen... 1 on skin qd    Ciprofloxacin Hcl 500 Mg Tabs (Ciprofloxacin hcl) .Marland Kitchen... 1 by mouth two times a day (take once  today)  Complete Medication List: 1)  Oxycodone Hcl 15 Mg Tabs (Oxycodone hcl) .Marland Kitchen.. 1 by mouth qid as needed pain. please,  fill on or after 04/12/2010 2)  Oxycontin 20 Mg Tb12 (Oxycodone hcl) .Marland Kitchen.. 1 tab by mouth two times a day please,   fill on or after 04/12/2010 3)  Cyclobenzaprine Hcl 10 Mg Tabs (Cyclobenzaprine hcl) .... Three times a day as needed for back pain 4)  Lisinopril 40 Mg Tabs (Lisinopril) .... Take 1/2  tablet daily 5)  Prilosec 20 Mg Cpdr (Omeprazole) .... Once daily 6)  Celebrex 200 Mg Caps (Celecoxib) .... Two times a day 7)  Metformin Hcl 1000 Mg Tabs (Metformin hcl) .... Take 1 tablet by mouth two times a day 8)  Wellbutrin Sr 150 Mg Tb12 (Bupropion hcl) .Marland Kitchen.. 1 po bid 9)  Vitamin D3 1000 Unit Tabs (Cholecalciferol) .... Once daily 10)  Glimepiride 1 Mg Tabs (Glimepiride) .Marland Kitchen.. 1 po qd 11)  Onetouch Ultra Test Strp (Glucose blood) .... Use once daily to two times a day 12)  Onetouch Ultrasoft Lancets Misc (Lancets) .... Use once daily to two times a day 13)  Lorazepam 2 Mg  Tabs (Lorazepam) .... 1/2 or 1 by mouth two times a day prn 14)  Lidoderm 5 % Ptch (Lidocaine) .Marland Kitchen.. 1 to 3 patches qday 15)  Fiber 625 Mg Tabs (Calcium polycarbophil) .... Take 4 daily 16)  Lomotil 2.5-0.025 Mg Tabs (Diphenoxylate-atropine) .Marland Kitchen.. 1-2 by mouth q 4-6 hours as needed 17)  Estradiol 0.5 Mg Tabs (Estradiol) .Marland Kitchen.. 1 every other day 18)  Medroxyprogesterone Acetate 2.5 Mg Tabs (Medroxyprogesterone acetate) .Marland Kitchen.. 1 every other day 19)  Ibuprofen 600 Mg Tabs (Ibuprofen) .Marland Kitchen.. 1 by mouth bid  pc x 1 wk then as needed for  pain 20)  Gelnique 10 % Gel (Oxybutynin chloride) .Marland Kitchen.. 1 on skin qd 21)  Prednisone 10 Mg Tabs (Prednisone) .... Take 40mg  qd for 3 days, then 20 mg qd for 3 days, then 10mg  qd for 6 days, then stop. take pc. 22)  Ciprofloxacin Hcl 500 Mg Tabs (Ciprofloxacin hcl) .Marland Kitchen.. 1 by mouth two times a day (take once  today)  Patient Instructions: 1)  Call if you are not better in a reasonable amount of time or if worse. Go to ER if feeling really bad!  Prescriptions: CIPROFLOXACIN HCL 500 MG TABS (CIPROFLOXACIN HCL) 1 by mouth two times a day (take once  today)  #20 x 1   Entered and Authorized by:   Cassandria Anger MD    Signed by:   Cassandria Anger MD on 04/08/2010   Method used:   Print then Give to Patient   RxID:   [id number] OXYCONTIN 20 MG  TB12 (OXYCODONE HCL) 1 tab by mouth two times a day Please,  fill on or after 04/12/2010 Brand medically necessary #60 x 0   Entered and Authorized by:   Cassandria Anger MD   Signed by:   Cassandria Anger MD on 04/08/2010   Method used:   Print then Give to Patient   RxID:   [id number] OXYCODONE HCL 15 MG  TABS (OXYCODONE HCL) 1 by mouth qid as needed pain. Please,  fill on or after 04/12/2010  #120 x 0   Entered and Authorized by:   Cassandria Anger MD   Signed by:   Cassandria Anger MD on 04/08/2010   Method used:   Print then Give to Patient   RxID:   [id number] PREDNISONE 10 MG TABS (PREDNISONE) Take 40mg  qd for 3 days, then 20 mg qd for 3 days, then 10mg  qd for 6 days, then stop. Take pc.  #24 x 1   Entered and Authorized by:   Cassandria Anger MD   Signed by:   Cassandria Anger MD on 04/08/2010   Method used:   Print then Give to Patient   RxID:   [id number] GELNIQUE 10 % GEL (OXYBUTYNIN CHLORIDE) 1 on skin qd  #90 x 3   Entered and Authorized by:   Cassandria Anger MD   Signed by:   Cassandria Anger MD on 04/08/2010   Method used:   Print then Give to Patient   RxID:   [id number] IBUPROFEN 600 MG TABS (IBUPROFEN) 1 by mouth bid  pc x 1 wk then as needed for  pain  #60 x 3   Entered and Authorized by:   Cassandria Anger MD   Signed by:   Cassandria Anger MD on 04/08/2010   Method used:   Electronically to        [facility] (retail)       [address]       [place], [place]  [address]       Ph: [phone]       Fax: [phone]   RxID:   [id number]

## 2011-01-12 NOTE — Progress Notes (Signed)
Summary: Rx refill  Phone Note Call from Patient Call back at Home Phone [phone]   Caller: Patient Call For: Cassandria Anger MD Reason for Call: Refill Medication Summary of Call: Patient came into the office requesting refills of Oxycontin 20 mg twice a day and Oxycodone 15 mg as needed.  Initial call taken by: Katy Apo,  January 08, 2010 4:13 PM  Follow-up for Phone Call        OK to ref Follow-up by: Cassandria Anger MD,  January 09, 2010 7:47 AM  Additional Follow-up for Phone Call Additional follow up Details #1::        left mess to call office back ........................Marland KitchenXitlalli Lamour, CMA  January 09, 2010 3:56 PM     Additional Follow-up for Phone Call Additional follow up Details #2::    pt pick up Follow-up by: Estell Harpin CMA,  January 09, 2010 4:34 PM  New/Updated Medications: OXYCODONE HCL 15 MG  TABS (OXYCODONE HCL) 1 by mouth qid as needed pain. Please,  fill on or after 01/13/2010 OXYCONTIN 20 MG  TB12 (OXYCODONE HCL) 1 tab by mouth two times a day Please,  fill on or after 01/13/2010 [BMN] Prescriptions: OXYCONTIN 20 MG  TB12 (OXYCODONE HCL) 1 tab by mouth two times a day Please,  fill on or after 01/13/2010 Brand medically necessary #60 x 0   Entered by:   Charlsie Quest, CMA   Authorized by:   Cassandria Anger MD   Signed by:   Charlsie Quest, CMA on 01/09/2010   Method used:   Print then Give to Patient   RxID:   [id number] OXYCODONE HCL 15 MG  TABS (OXYCODONE HCL) 1 by mouth qid as needed pain. Please,  fill on or after 01/13/2010  #120 x 0   Entered by:   Charlsie Quest, CMA   Authorized by:   Cassandria Anger MD   Signed by:   Charlsie Quest, CMA on 01/09/2010   Method used:   Print then Give to Patient   RxID:   [id number]

## 2011-01-12 NOTE — Progress Notes (Signed)
Summary: OMEPRAZOLE  Phone Note Refill Request Message from:  Fax from Pharmacy on May 19, 2009 3:49 PM  Refills Requested: Medication #1:  PRILOSEC 20 MG  CPDR once daily [facility] [id number]   Method Requested: Electronic Initial call taken by: Tomma Lightning,  May 19, 2009 3:50 PM      Prescriptions: PRILOSEC 20 MG  CPDR (OMEPRAZOLE) once daily  #90 x 3   Entered by:   Tomma Lightning   Authorized by:   Cassandria Anger MD   Signed by:   Tomma Lightning on 05/19/2009   Method used:   Electronically to        [facility] (retail)       [address]       [place], [place]  [address]       Ph: [phone]       Fax: [phone]   [id number]

## 2011-01-12 NOTE — Progress Notes (Signed)
Summary: Refill request  Phone Note Refill Request Message from:  Patient  Refills Requested: Medication #1:  OXYCONTIN 20 MG  TB12 1 tab by mouth two times a day Please  Medication #2:  OXYCODONE HCL 15 MG  TABS 1 by mouth qid as needed pain. Please Walk in Patient form filled out.  Next Appointment Scheduled: 07/10/2008 Initial call taken by: Iran Planas CMA,  June 04, 2008 4:30 PM  Follow-up for Phone Call        OK to ref Follow-up by: Cassandria Anger MD,  June 04, 2008 5:29 PM  Additional Follow-up for Phone Call Additional follow up Details #1::        PATIENT INFORMED. Additional Follow-up by: Linden Dolin,  June 05, 2008 9:36 AM

## 2011-01-12 NOTE — Progress Notes (Signed)
Summary: REFILLS  Phone Note Refill Request   Refills Requested: Medication #1:  ONETOUCH ULTRA TEST   STRP use once daily to two times a day  Medication #2:  ONETOUCH ULTRASOFT LANCETS   MISC use once daily to two times a day. Initial call taken by: Charlsie Quest,  July 12, 2008 8:39 AM    New/Updated Medications: ONETOUCH ULTRA TEST   STRP (GLUCOSE BLOOD) use once daily to two times a day ONETOUCH ULTRASOFT LANCETS   MISC (LANCETS) use once daily to two times a day   Prescriptions: ONETOUCH ULTRASOFT LANCETS   MISC (LANCETS) use once daily to two times a day  #50 x 6   Entered by:   Charlsie Quest   Authorized by:   Cassandria Anger MD   Signed by:   Charlsie Quest on 07/12/2008   Method used:   Electronically sent to ...       [place], [place]  [id number]       Ph: [phone]       Fax: [phone]   RxID:   [id number] Donald Siva TEST   STRP (GLUCOSE BLOOD) use once daily to two times a day  #50 x 6   Entered by:   Charlsie Quest   Authorized by:   Cassandria Anger MD   Signed by:   Charlsie Quest on 07/12/2008   Method used:   Electronically sent to ...       [place], [place]  [id number]       Ph: [phone]       Fax: [phone]   RxID:   [id number]

## 2011-01-12 NOTE — Progress Notes (Signed)
Summary: REFILL  Phone Note Refill Request   Refills Requested: Medication #1:  OXYCODONE HCL 15 MG  TABS 1 by mouth qid as needed pain. Please  Medication #2:  OXYCONTIN 20 MG  TB12 1 tab by mouth two times a day Please Initial call taken by: Charlsie Quest, [place],  October 16, 2010 4:18 PM  Follow-up for Phone Call        OK PER MD, Rx's given to pt Follow-up by: Charlsie Quest, CMA,  October 16, 2010 4:50 PM  Additional Follow-up for Phone Call Additional follow up Details #1::        Thank you!  Additional Follow-up by: Cassandria Anger MD,  October 16, 2010 5:51 PM    New/Updated Medications: OXYCODONE HCL 15 MG  TABS (OXYCODONE HCL) 1 by mouth qid as needed pain. Please,  fill on or after 10/17/2010 OXYCONTIN 20 MG  TB12 (OXYCODONE HCL) 1 tab by mouth two times a day Please,  fill on or after 10/17/2010 [BMN] Prescriptions: OXYCONTIN 20 MG  TB12 (OXYCODONE HCL) 1 tab by mouth two times a day Please,  fill on or after 10/17/2010 Brand medically necessary #60 x 0   Entered by:   Charlsie Quest, CMA   Authorized by:   Cassandria Anger MD   Signed by:   Charlsie Quest, CMA on 10/16/2010   Method used:   Print then Give to Patient   RxID:   [id number] OXYCODONE HCL 15 MG  TABS (OXYCODONE HCL) 1 by mouth qid as needed pain. Please,  fill on or after 10/17/2010  #120 x 0   Entered by:   Charlsie Quest, CMA   Authorized by:   Cassandria Anger MD   Signed by:   Charlsie Quest, CMA on 10/16/2010   Method used:   Print then Give to Patient   RxID:   [id number]

## 2011-01-12 NOTE — Progress Notes (Signed)
Summary: Long Term Controlled Substances   Long Term Controlled Substances   Imported By: Jerrye Noble D'jimraou 11/13/2007 14:09:15  _____________________________________________________________________  External Attachment:    Type:   Image     Comment:   External Document

## 2011-01-12 NOTE — Progress Notes (Signed)
Summary: pain med rf  Phone Note Call from Patient Call back at Home Phone [phone] Call back at [phone]   Summary of Call: Pt requesting oxycontin 20mg  & oxycondone 30mg  RF Next office visit wed 11/26 @ 10:00. 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 Initial call taken by: Charlsie Quest,  October 19, 2007 6:07 PM  Follow-up for Phone Call        OK Follow-up by: Cassandria Anger MD,  October 20, 2007 5:07 PM  Additional Follow-up for Phone Call Additional follow up Details #1::        Pt picked up written rx Additional Follow-up by: Charlsie Quest,  October 23, 2007 1:03 PM    New/Updated Medications: OXYCONTIN 20 MG  TB12 (OXYCODONE HCL) 1 tab by mouth bid OXYCODONE HCL 30 MG  TABS (OXYCODONE HCL) 1 tab by mouth qid as needed pain   Prescriptions: OXYCODONE HCL 30 MG  TABS (OXYCODONE HCL) 1 tab by mouth qid as needed pain  #120 x 0   Entered and Authorized by:   Cassandria Anger MD   Signed by:   Cassandria Anger MD on 10/20/2007   Method used:   Print then Give to Patient   RxID:   [id number] OXYCONTIN 20 MG  TB12 (OXYCODONE HCL) 1 tab by mouth bid  #60 x 0   Entered and Authorized by:   Cassandria Anger MD   Signed by:   Cassandria Anger MD on 10/20/2007   Method used:   Print then Give to Patient   RxID:   [id number]

## 2011-01-12 NOTE — Progress Notes (Signed)
Summary: REFILL  Phone Note From Pharmacy   Summary of Call: Patient is requesting 3 mth of Detrol La, I don't see in EMR, if ok please provide info. Initial call taken by: Charlsie Quest,  May 24, 2008 1:56 PM  Follow-up for Phone Call        OK Follow-up by: Cassandria Anger MD,  May 24, 2008 5:31 PM    New/Updated Medications: DETROL LA 4 MG CP24 (TOLTERODINE TARTRATE) 1 tablet by mouth daily   Prescriptions: DETROL LA 4 MG CP24 (TOLTERODINE TARTRATE) 1 tablet by mouth daily  #90 x 3   Entered by:   Charlsie Quest   Authorized by:   Cassandria Anger MD   Signed by:   Charlsie Quest on 05/24/2008   Method used:   Electronically sent to ...       [facility]       [place], [place]  [address]       Ph: [phone]       Fax: [phone]   RxID:   [id number]

## 2011-01-12 NOTE — Progress Notes (Signed)
Summary: DIARRHEA   Phone Note Call from Patient   Caller: Patient Summary of Call: PATIENT IS [place]. TELLS ME THAT SHE IS HAVING BAD DIARRHEA X 3 DAYS, SIMILAR TO LAST FALL WHEN SHE WAS IN THE HOSPITAL. NO RECENT TRAVEL OR ANTIBIOTICS. CONCERNED BECAUSE SHE IS PLANNING TO TRAVEL IN NEAR FUTURE. PREVIOUS PROBLEM TREATED W/ XIFAXAN / METRONIDAZOLE W/ GOOD RESULTS. PLEASE CALL IN METRONIDAZOLE 250MG  QID X 7 DAYS AND XIFAXAN 200MG  three times a day X 7 DAYS (SHE USES [facility]) Initial call taken by: Irene Shipper MD,  June 02, 2009 9:55 AM  Follow-up for Phone Call        Patient  notified that RX has been called in Follow-up by: Barb Merino RN,  June 02, 2009 9:59 AM    New/Updated Medications: XIFAXAN 200 MG  TABS (RIFAXIMIN) 1 by mouth three times a day x 7 days   Prescriptions: XIFAXAN 200 MG  TABS (RIFAXIMIN) 1 by mouth three times a day x 7 days  #21 x 0   Entered by:   Barb Merino RN   Authorized by:   Irene Shipper MD   Signed by:   Barb Merino RN on 06/02/2009   Method used:   Electronically to        [facility] (retail)       [address]       [place], [place]  [address]       Ph: [phone]       Fax: [phone]   RxID:   [id number]   Appended Document: DIARRHEA     Clinical Lists Changes  Medications: Added new medication of METRONIDAZOLE 250 MG  TABS (METRONIDAZOLE) take 1 by mouth qid x 7 days - Signed Rx of METRONIDAZOLE 250 MG  TABS (METRONIDAZOLE) take 1 by mouth qid x 7 days;  #28 x 0;  Signed;  Entered by: Barb Merino RN;  Authorized by: Irene Shipper MD;  Method used: Electronically to [facility] [place], [place], [place]  [address], Ph: [phone], Fax: [phone]    Prescriptions: METRONIDAZOLE 250 MG  TABS (METRONIDAZOLE) take 1 by mouth qid x 7 days  #28 x 0   Entered by:   Barb Merino  RN   Authorized by:   Irene Shipper MD   Signed by:   Barb Merino RN on 06/03/2009   Method used:   Electronically to        [facility] (retail)       [address]       [place]       [place], [place]  [address]       Ph: [phone]       Fax: [phone]   [id number]

## 2011-01-12 NOTE — Assessment & Plan Note (Signed)
Summary: 4 MO ROV /NWS  #   Vital Signs:  Patient profile:   60 year old female Height:      62 inches Weight:      195 pounds BMI:     35.79 O2 Sat:      95 % on Room air Temp:     97.6 degrees F oral Pulse rate:   79 / minute Pulse rhythm:   regular Resp:     16 per minute BP sitting:   128 / 74  (left arm) Cuff size:   regular  Vitals Entered By: Jonathon Resides, CMA(AAMA) (July 15, 2010 1:21 PM)  O2 Flow:  Room air CC: 4 mo f/u Is Patient Diabetic? Yes Comments pt is not taking Celebrex, Ibuprofen or Gelinique   Primary Care Provider:  [name], MD  CC:  4 mo f/u.  History of Present Illness: The patient presents for a follow up of hypertension, diabetes, hyperlipidemia and LBB, knee pain  Current Medications (verified): 1)  Oxycodone Hcl 15 Mg  Tabs (Oxycodone Hcl) .Marland Kitchen.. 1 By Mouth Qid As Needed Pain. Please,  Fill On or After 06/16/2010 2)  Oxycontin 20 Mg  Tb12 (Oxycodone Hcl) .Marland Kitchen.. 1 Tab By Mouth Two Times A Day Please,  Fill On or After 06/16/2010 3)  Cyclobenzaprine Hcl 10 Mg  Tabs (Cyclobenzaprine Hcl) .... Three Times A Day As Needed For Back Pain 4)  Prilosec 20 Mg  Cpdr (Omeprazole) .... Once Daily 5)  Celebrex 200 Mg  Caps (Celecoxib) .... Two Times A Day 6)  Metformin Hcl 1000 Mg Tabs (Metformin Hcl) .... Take 1 Tablet By Mouth Two Times A Day 7)  Wellbutrin Sr 150 Mg Tb12 (Bupropion Hcl) .Marland Kitchen.. 1 Po Bid 8)  Vitamin D3 1000 Unit  Tabs (Cholecalciferol) .... Once Daily 9)  Glimepiride 1 Mg Tabs (Glimepiride) .Marland Kitchen.. 1 Po Qd 10)  Onetouch Ultra Test   Strp (Glucose Blood) .... Use Once Daily To Two Times A Day 11)  Onetouch Ultrasoft Lancets   Misc (Lancets) .... Use Once Daily To Two Times A Day 12)  Lorazepam 2 Mg Tabs (Lorazepam) .... 1/2 or 1 By Mouth Two Times A Day Prn 13)  Lidoderm 5 % Ptch (Lidocaine) .Marland Kitchen.. 1 To 3 Patches Qday 14)  Fiber 625 Mg Tabs (Calcium Polycarbophil) .... Take 4 Daily 15)  Lomotil 2.5-0.025 Mg Tabs (Diphenoxylate-Atropine) .Marland Kitchen..  1-2 By Mouth Q 4-6 Hours As Needed 16)  Estradiol 0.5 Mg Tabs (Estradiol) .Marland Kitchen.. 1 Every Other Day 17)  Medroxyprogesterone Acetate 2.5 Mg Tabs (Medroxyprogesterone Acetate) .Marland Kitchen.. 1 Every Other Day 18)  Ibuprofen 600 Mg Tabs (Ibuprofen) .Marland Kitchen.. 1 By Mouth Bid  Pc X 1 Wk Then As Needed For  Pain 19)  Gelnique 10 % Gel (Oxybutynin Chloride) .Marland Kitchen.. 1 On Skin Qd 20)  Lisinopril 10 Mg Tabs (Lisinopril) .Marland Kitchen.. 1 By Mouth Qd 21)  Oramoist ..Marland Kitchen. 1 As Two Times A Day Dirrected  Allergies (verified): 1)  ! 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 quit 1990 Daily Caffeine Use -2  Review of Systems  The patient denies fever, chest pain, prolonged cough, and abdominal pain.    Physical Exam  General:  Well developed, well nourished, in no acute distress. Ears:  Normal auditory acuity. Nose:  No deformity, discharge,  or lesions. Mouth:  Not dry Lungs:  Clear throughout to auscultation. Heart:  Regular rate and rhythm; no murmurs, rubs,  or bruits. Abdomen:  Soft, nontender and nondistended. No masses, hepatosplenomegaly or hernias noted. Normal bowel sounds. Msk:  Lumbar-sacral spine is tender to palpation over paraspinal muscles and painfull with the ROM  B knees w/post-op changes Extremities:  No clubbing, cyanosis, edema or deformities noted. Neurologic:  Alert and  oriented x4;  grossly normal neurologically. Skin:  Intact without significant lesions or rashes. Psych:  Alert and cooperative. tearful and slightly anxious.     Impression & Recommendations:  Problem # 1:  RENAL INSUFFICIENCY  (ICD-588.9) Assessment Improved  Problem # 2:  HYPERKALEMIA (R9761134.7) Assessment: New D/c ACE  Problem # 3:  ANEMIA-UNSPECIFIED (N067566.9) Assessment: Improved  Hgb: 11.1 (04/08/2010)   Hct: 32.1 (04/08/2010)   Platelets: 190.0 (04/08/2010) RBC: 3.46 (04/08/2010)   RDW: 13.5 (04/08/2010)   WBC: 14.6 (04/08/2010) MCV: 92.7 (04/08/2010)   MCHC: 34.5 (04/08/2010) TSH: 3.44 (04/08/2010)  Problem # 4:  HYPERTENSION (ICD-401.9) Assessment: Unchanged  The following medications were removed from the medication list:    Lisinopril 10 Mg Tabs (Lisinopril) .Marland Kitchen... 1 by mouth qd Her updated medication list for this problem includes:    Coreg 12.5 Mg Tabs (Carvedilol) .Marland Kitchen... 1 by mouth bid  BP today: 128/74 Prior BP: 124/80 (04/13/2010)  Labs Reviewed: K+: 5.6 (07/08/2010) Creat: : 1.0 (07/08/2010)   Chol: 170 (10/27/2009)   HDL: 65.80 (10/27/2009)   LDL: 95 (10/27/2009)   TG: 48.0 (10/27/2009)  Problem # 5:  DIABETES MELLITUS, TYPE II (ICD-250.00) Assessment: Improved  The following medications were removed from the medication list:    Lisinopril 10 Mg Tabs (Lisinopril) .Marland Kitchen... 1 by mouth qd Her updated medication list for this problem includes:    Metformin Hcl 1000 Mg Tabs (Metformin hcl) .Marland Kitchen... Take 1 tablet by mouth two times a day    Glimepiride 1 Mg Tabs (Glimepiride) .Marland Kitchen... 1 po qd  Labs Reviewed: Creat: 1.0 (07/08/2010)    Reviewed HgBA1c results: 6.2 (07/08/2010)  6.2 (01/28/2010)  Problem # 6:  GERD (ICD-530.81) Assessment: Improved  Her updated medication list for this problem includes:    Prilosec 20 Mg Cpdr (Omeprazole) ..... Once daily  Problem # 7:  KNEE PAIN (ICD-719.46) Assessment: Unchanged  Her updated medication list for this problem includes:    Oxycodone Hcl 15 Mg Tabs (Oxycodone hcl) .Marland Kitchen... 1 by mouth qid as needed pain. please,  fill on or after 09/16/2010    Oxycontin 20 Mg Tb12 (Oxycodone hcl) .Marland Kitchen... 1 tab by mouth two times a day please,  fill on or  after 09/16/2010    Cyclobenzaprine Hcl 10 Mg Tabs (Cyclobenzaprine hcl) .Marland Kitchen... Three times a day as needed for back pain    Celebrex 200 Mg Caps (Celecoxib) .Marland Kitchen..Marland Kitchen Two times a day    Ibuprofen 600 Mg Tabs (Ibuprofen) .Marland Kitchen... 1 by mouth bid  pc x 1 wk then as needed for  pain  Problem # 8:  LOW BACK PAIN (ICD-724.2) Assessment: Unchanged  Her updated medication list for this problem includes:    Oxycodone Hcl 15 Mg Tabs (Oxycodone hcl) .Marland Kitchen... 1 by mouth qid as needed pain. please,  fill on or after 09/16/2010    Oxycontin 20 Mg Tb12 (Oxycodone hcl) .Marland Kitchen... 1 tab by mouth two times a day please,  fill on or after  09/16/2010    Cyclobenzaprine Hcl 10 Mg Tabs (Cyclobenzaprine hcl) .Marland Kitchen... Three times a day as needed for back pain    Celebrex 200 Mg Caps (Celecoxib) .Marland Kitchen..Marland Kitchen Two times a day    Ibuprofen 600 Mg Tabs (Ibuprofen) .Marland Kitchen... 1 by mouth bid  pc x 1 wk then as needed for  pain  Complete Medication List: 1)  Oxycodone Hcl 15 Mg Tabs (Oxycodone hcl) .Marland Kitchen.. 1 by mouth qid as needed pain. please,  fill on or after 09/16/2010 2)  Oxycontin 20 Mg Tb12 (Oxycodone hcl) .Marland Kitchen.. 1 tab by mouth two times a day please,  fill on or after 09/16/2010 3)  Cyclobenzaprine Hcl 10 Mg Tabs (Cyclobenzaprine hcl) .... Three times a day as needed for back pain 4)  Prilosec 20 Mg Cpdr (Omeprazole) .... Once daily 5)  Celebrex 200 Mg Caps (Celecoxib) .... Two times a day 6)  Metformin Hcl 1000 Mg Tabs (Metformin hcl) .... Take 1 tablet by mouth two times a day 7)  Wellbutrin Sr 150 Mg Tb12 (Bupropion hcl) .Marland Kitchen.. 1 po bid 8)  Vitamin D3 1000 Unit Tabs (Cholecalciferol) .... Once daily 9)  Glimepiride 1 Mg Tabs (Glimepiride) .Marland Kitchen.. 1 po qd 10)  Onetouch Ultra Test Strp (Glucose blood) .... Use once daily to two times a day 11)  Onetouch Ultrasoft Lancets Misc (Lancets) .... Use once daily to two times a day 12)  Lorazepam 2 Mg Tabs (Lorazepam) .... 1/2 or 1 by mouth two times a day prn 13)  Lidoderm 5 % Ptch (Lidocaine) .Marland Kitchen.. 1 to 3  patches qday 14)  Fiber 625 Mg Tabs (Calcium polycarbophil) .... Take 4 daily 15)  Lomotil 2.5-0.025 Mg Tabs (Diphenoxylate-atropine) .Marland Kitchen.. 1-2 by mouth q 4-6 hours as needed 16)  Estradiol 0.5 Mg Tabs (Estradiol) .Marland Kitchen.. 1 every other day 17)  Medroxyprogesterone Acetate 2.5 Mg Tabs (Medroxyprogesterone acetate) .Marland Kitchen.. 1 every other day 18)  Ibuprofen 600 Mg Tabs (Ibuprofen) .Marland Kitchen.. 1 by mouth bid  pc x 1 wk then as needed for  pain 19)  Gelnique 10 % Gel (Oxybutynin chloride) .Marland Kitchen.. 1 on skin qd 20)  Oramoist  .... 1 as two times a day dirrected 21)  Coreg 12.5 Mg Tabs (Carvedilol) .Marland Kitchen.. 1 by mouth bid  Patient Instructions: 1)  Please schedule a follow-up appointment in 3 months well w/labs. 2)  BMP prior to visit, ICD-9:in 1 month 3)  HbgA1C prior to visit, ICD-9: 250.00 in 3 months  Prescriptions: OXYCONTIN 20 MG  TB12 (OXYCODONE HCL) 1 tab by mouth two times a day Please,  fill on or after 09/16/2010 Brand medically necessary #60 x 0   Entered and Authorized by:   Cassandria Anger MD   Signed by:   Cassandria Anger MD on 07/15/2010   Method used:   Print then Give to Patient   RxID:   [id number] OXYCODONE HCL 15 MG  TABS (OXYCODONE HCL) 1 by mouth qid as needed pain. Please,  fill on or after 09/16/2010  #120 x 0   Entered and Authorized by:   Cassandria Anger MD   Signed by:   Cassandria Anger MD on 07/15/2010   Method used:   Print then Give to Patient   RxID:   [id number] OXYCONTIN 20 MG  TB12 (OXYCODONE HCL) 1 tab by mouth two times a day Please,  fill on or after 09/16/2010 Brand medically necessary #60 x 0   Entered and Authorized by:   Cassandria Anger MD   Signed  by:   Cassandria Anger MD on 07/15/2010   Method used:   Print then Give to Patient   RxID:   [id number] OXYCODONE HCL 15 MG  TABS (OXYCODONE HCL) 1 by mouth qid as needed pain. Please,  fill on or after 09/16/2010  #120 x 0   Entered and Authorized by:   Cassandria Anger MD   Signed  by:   Cassandria Anger MD on 07/15/2010   Method used:   Print then Give to Patient   RxID:   [id number] OXYCONTIN 20 MG  TB12 (OXYCODONE HCL) 1 tab by mouth two times a day Please,  fill on or after 08/17/2010 Brand medically necessary #60 x 0   Entered and Authorized by:   Cassandria Anger MD   Signed by:   Cassandria Anger MD on 07/15/2010   Method used:   Print then Give to Patient   RxID:   [id number] OXYCODONE HCL 15 MG  TABS (OXYCODONE HCL) 1 by mouth qid as needed pain. Please,  fill on or after 08/17/2010  #120 x 0   Entered and Authorized by:   Cassandria Anger MD   Signed by:   Cassandria Anger MD on 07/15/2010   Method used:   Print then Give to Patient   RxID:   [id number] OXYCONTIN 20 MG  TB12 (OXYCODONE HCL) 1 tab by mouth two times a day Please,  fill on or after 07/17/2010 Brand medically necessary #60 x 0   Entered and Authorized by:   Cassandria Anger MD   Signed by:   Cassandria Anger MD on 07/15/2010   Method used:   Print then Give to Patient   RxID:   [id number] OXYCODONE HCL 15 MG  TABS (OXYCODONE HCL) 1 by mouth qid as needed pain. Please,  fill on or after 07/17/2010  #120 x 0   Entered and Authorized by:   Cassandria Anger MD   Signed by:   Cassandria Anger MD on 07/15/2010   Method used:   Print then Give to Patient   RxID:   [id number] LIDODERM 5 % PTCH (LIDOCAINE) 1 to 3 patches qday  #90 x 6   Entered and Authorized by:   Cassandria Anger MD   Signed by:   Cassandria Anger MD on 07/15/2010   Method used:   Electronically to        [facility] (retail)       [address]       [place], [place]  [address]       Ph: [phone]       Fax: [phone]   RxID:   [id number] WELLBUTRIN SR 150 MG TB12 (BUPROPION HCL) 1 po bid  #180 x 3   Entered and Authorized by:   Cassandria Anger MD   Signed by:   Cassandria Anger MD on  07/15/2010   Method used:   Electronically to        [facility] (retail)       [address]       [place], [place]  [address]       Ph: [phone]       Fax: [phone]   RxID:   [id number] COREG 12.5 MG TABS (CARVEDILOL) 1 by mouth bid  #180  x 3   Entered and Authorized by:   Cassandria Anger MD   Signed by:   Cassandria Anger MD on 07/15/2010   Method used:   Electronically to        [facility] (retail)       [address]       [place], [place]  [address]       Ph: [phone]       Fax: [phone]   RxID:   [id number]

## 2011-01-12 NOTE — Miscellaneous (Signed)
Summary: buropion  Clinical Lists Changes  Medications: Rx of WELLBUTRIN SR 150 MG TB12 (BUPROPION HCL) 1 po bid;  #180 x 3;  Signed;  Entered by: Doralee Albino;  Authorized by: Cassandria Anger MD;  Method used: Telephoned to [facility] [address] [place], [place]  [id number], Ph: [phone], Fax: [phone]    Prescriptions: WELLBUTRIN SR 150 MG TB12 (BUPROPION HCL) 1 po bid  #180 x 3   Entered by:   Doralee Albino   Authorized by:   Cassandria Anger MD   Signed by:   Doralee Albino on 03/24/2009   Method used:   Telephoned to ...       [facility] (retail)       [address] [place], [place]  [id number]       Ph: [phone]       Fax: [phone]   RxID:   [id number]  cancel refill wrong pharmacy....suppose to be [facility]/vg

## 2011-01-12 NOTE — Assessment & Plan Note (Signed)
Summary: FOLLOWUP LYMPHOCYTIC COLITIS   History of Present Illness Visit Type: Follow-up Visit Primary GI MD: Anita Shorts MD Primary Provider: [name], MD Chief Complaint: Colitis History of Present Illness:   [name] presents today for followup. She has a history of hypertension, type 2 diabetes mellitus, degenerative arthritis, and chronic low back pain. On June 23, 2009 she underwent complete colonoscopy. This revealed diverticulosis. The ileum and colon were grossly normal. Random biopsies of the colon revealed lymphocytic colitis. A few days later she was started on Entocort 9 mg daily. Within one week, her diarrhea abated. She continues on the medication without problems. She describes formed bowel movements daily with occasional constipation. No bleeding, abdominal pain, or other issues.   GI Review of Systems    Reports acid reflux.      Denies abdominal pain, belching, bloating, chest pain, dysphagia with liquids, dysphagia with solids, heartburn, loss of appetite, nausea, vomiting, vomiting blood, weight loss, and  weight gain.      Reports diverticulosis.     Denies anal fissure, black tarry stools, change in bowel habit, constipation, diarrhea, fecal incontinence, heme positive stool, hemorrhoids, irritable bowel syndrome, jaundice, light color stool, liver problems, rectal bleeding, and  rectal pain. Preventive Screening-Counseling & Management  Alcohol-Tobacco     Smoking Status: quit    Current Medications (verified): 1)  Lisinopril 40 Mg  Tabs (Lisinopril) .... Take 1/2  Tablet Daily 2)  Prilosec 20 Mg  Cpdr (Omeprazole) .... Once Daily 3)  Celebrex 200 Mg  Caps (Celecoxib) .... Two Times A Day 4)  Oxycontin 20 Mg  Tb12 (Oxycodone Hcl) .Marland Kitchen.. 1 Tab By Mouth Two Times A Day Please,  Fill On or After 08/29/2009 5)  Oxycodone Hcl 15 Mg  Tabs (Oxycodone Hcl) .Marland Kitchen.. 1 By Mouth Qid As Needed Pain. Please,  Fill On or After 08/29/2009 6)  Metformin Hcl 1000 Mg Tabs (Metformin  Hcl) .... Take 1 Tablet By Mouth Two Times A Day 7)  Wellbutrin Sr 150 Mg Tb12 (Bupropion Hcl) .Marland Kitchen.. 1 Po Bid 8)  Cyclobenzaprine Hcl 10 Mg  Tabs (Cyclobenzaprine Hcl) .... Three Times A Day As Needed For Back Pain 9)  Vitamin D3 1000 Unit  Tabs (Cholecalciferol) .... Once Daily 10)  Detrol La 4 Mg Cp24 (Tolterodine Tartrate) .Marland Kitchen.. 1 Tablet By Mouth Daily 11)  Glimepiride 1 Mg Tabs (Glimepiride) .Marland Kitchen.. 1 Po Qd 12)  Onetouch Ultra Test   Strp (Glucose Blood) .... Use Once Daily To Two Times A Day 13)  Onetouch Ultrasoft Lancets   Misc (Lancets) .... Use Once Daily To Two Times A Day 14)  Lorazepam 2 Mg Tabs (Lorazepam) .... 1/2 or 1 By Mouth Two Times A Day Prn 15)  Femhrt Low Dose 0.5-2.5 Mg-Mcg Tabs (Norethindrone-Eth Estradiol) .... Once Daily 16)  Lunesta 3 Mg Tabs (Eszopiclone) .... One By Mouth At Bedtime As Needed 17)  Lidoderm 5 % Ptch (Lidocaine) .Marland Kitchen.. 1 To 3 Patches Qday 18)  Fiber 625 Mg Tabs (Calcium Polycarbophil) .... Take 4 Daily 19)  Lomotil 2.5-0.025 Mg Tabs (Diphenoxylate-Atropine) .Marland Kitchen.. 1-2 By Mouth Q 4-6 Hours As Needed 20)  Entocort Ec 3 Mg Xr24h-Cap (Budesonide) .... Take 3 P.o. Q.day 21)  Cipro 500 Mg Tabs (Ciprofloxacin Hcl) .Marland Kitchen.. 1 By Mouth 2 Times Daily  Allergies (verified): 1)  ! Erythromycin (Erythromycin Base)  Past History:  Past Medical History: Reviewed history from 07/30/2009 and no changes required. 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 Foramen magnum ependymoma  surgery 2003 [name] Total knee replacement L 2008, R 2009  [name]  Family History: Family History Diabetes 1st degree relative No FH of Colon Cancer: Family History of Colitis/Crohn's:Maternal Uncle x2  Social History: Occupation:  Therapist, sports Single Alcohol use-no Patient is a former smoker. quit 1990 Daily Caffeine Use -2  Review of Systems       back pain. Otherwise negative review of systems  Vital Signs:  Patient profile:   60 year old female Height:      62 inches Weight:      178 pounds BMI:     32.67 Pulse rate:   68 / minute Pulse rhythm:   regular BP sitting:   110 / 72  (left arm) Cuff size:   regular  Vitals Entered By: June McMurray Haliimaile Deborra Medina) (August 06, 2009 11:28 AM)  Physical Exam  General:  Well developed, well nourished, no acute distress. Head:  Normocephalic and atraumatic. Eyes:  PERRLA, no icterus. Lungs:  Clear throughout to auscultation. Heart:  Regular rate and rhythm; no murmurs, rubs,  or bruits. Abdomen:  Soft, nontender and nondistended. No masses, hepatosplenomegaly or hernias noted. Normal bowel sounds. Pulses:  Normal pulses noted. Extremities:  No clubbing, cyanosis, edema or deformities noted. Neurologic:  Alert and  oriented x4;   Skin:  Intact without significant lesions or rashes. Psych:  Alert and cooperative. Normal mood and affect.   Impression & Recommendations:  Problem # 1:  LYMPHOCYTIC COLITIS (ICD-558.9) Assessment Improved lymphocytic colitis improved after initiating Entocort therapy. No appreciable side effects, problems, or other issues at present.  Plan: #1. Continue Entocort 9 mg daily for 2 weeks then taper by 3 mg every 4 weeks until off the medication. Observe for relapse during or at some point after completing taper. Patient will contact the office regarding relapse, if it occurs.. This was written out for the patient. #2. Routine office followup in 6 months  Problem # 2:  GERD (ICD-530.81) Assessment: Unchanged  Patient Instructions: 1)  Please schedule a follow-up appointment in 6 months. 2)  Taper Entocort as instructed by Anita. Henrene [name]. 3)  The medication list was reviewed and reconciled.  All changed / newly prescribed medications were  explained.  A complete medication list was provided to the patient / caregiver.

## 2011-01-12 NOTE — Letter (Signed)
Summary: Patient Notice- Polyp Results  [facility]  [address] [place], [place] [address]   Phone: [phone]  Fax: [phone]        October 01, 2008 MRN: [id number]    [place], [place]  [address]    Dear Ms. Madera,  I am pleased to inform you that the colon polyp(s) removed during your recent colonoscopy was (were) found to be benign (no cancer detected) upon pathologic examination.  I recommend you have a repeat colonoscopy examination in _5 years to look for recurrent polyps, as having colon polyps increases your risk for having recurrent polyps or even colon cancer in the future.  Should you develop new or worsening symptoms of abdominal pain, bowel habit changes or bleeding from the rectum or bowels, please schedule an evaluation with either your primary care physician or with me.  Additional information/recommendations:  __ No further action with gastroenterology is needed at this time. Please      follow-up with your primary care physician for your other healthcare      needs.  __ Please call [phone] to schedule a return visit to review your      situation.  __ Please keep your follow-up visit as already scheduled.  __5 Continue treatment plan as outlined the day of your exam.  Please call us if you are having persistent problems or have questions about your condition that have not been fully answered at this time.  Sincerely,  Inda Castle MD  This letter has been electronically signed by your physician.  Appended Document: Patient Notice- Polyp Results Letter mailed to patient. Recall in [place] for 09/2013 w/Dr.Perry.

## 2011-01-12 NOTE — Procedures (Signed)
Summary: Colonoscopy   Colonoscopy  Procedure date:  09/27/2008  Findings:      Location:  [facility].    Patient Name: [name], [name] MRN: [id number] Procedure Procedures: Colonoscopy CPT: [id number].    with biopsy. CPT: X8550940.    with polypectomy. CPT: X5071110.  Personnel: Endoscopist: Sandy Salaam. Deatra Ina, MD.  Patient Consent: Procedure, Alternatives, Risks and Benefits discussed, consent obtained,  Indications Symptoms: Diarrhea  History  Current Medications: Patient is not currently taking Coumadin.  Pre-Exam Physical: Performed Sep 27, 2008. Entire physical exam was normal. Cardio- pulmonary exam, Rectal exam, HEENT exam , Abdominal exam WNL.  Exam Exam: Extent of exam reached: Terminal Ileum, extent intended: Terminal Ileum.  The cecum was identified by appendiceal orifice and IC valve. Time for Withdrawl: 00:18:30. Colon retroflexion performed. ASA Classification: II. Tolerance: good.  Monitoring: Pulse and BP monitoring, Oximetry used. Supplemental O2 given. at 2 Liters.  Colon Prep Used none for colon prep.  Sedation Meds: Patient assessed and found to be appropriate for moderate (conscious) sedation. Fentanyl 125 mcg. given IV. Versed 10 mg. given IV. Benadryl 25 given IV.  Findings - DIVERTICULOSIS: Descending Colon to Sigmoid Colon. ICD9: Diverticulosis: 562.10. Comments: Scattered diverticula.  - MUCOSAL ABNORMALITY: Ileum to Rectum. Erythema present. Granularity absent, bleeding absent, Haustral folds normal. Friability: mild. Activity level mild, Biopsy/Mucosal Abn. taken. ICD9: Colitis, Unspecified: 558.9. Comments: Diffuse, mild erythema throughout colon and terminal ileum with mild friability and edema.  POLYP: Sigmoid Colon, Maximum size: 10 mm. sessile polyp. Procedure:  snare with cautery, Polyp sent to pathology. ICD9: Colon Polyps: 211.3.   Assessment Abnormal examination, see findings above.  Diagnoses: 558.9: Colitis,  Unspecified.  562.10: Diverticulosis.  211.3: Colon Polyps.   Events  Unplanned Interventions: No intervention was required.  Unplanned Events: There were no complications. Plans  Post Exam Instructions: Post sedation instructions given.  Medication Plan: Continue current medications.  Scheduling/Referral: Colonoscopy, to [facility]. Henrene Pastor, MD, around Sep 27, 2013.   (Recall in [place] for 09/2013. Vivia Ewing LPN  October 20, 579FGE 10:00 AM)  cc: [name], MD     [name], MD   This report was created from the original endoscopy report, which was reviewed and signed by the above listed endoscopist.

## 2011-01-12 NOTE — Assessment & Plan Note (Signed)
Summary: f/u - Sd   Vital Signs:  Patient profile:   60 year old female Height:      62 inches Weight:      194.75 pounds BMI:     35.75 O2 Sat:      97 % on Room air Temp:     98.2 degrees F oral Pulse rate:   80 / minute BP sitting:   124 / 80  (left arm) Cuff size:   regular  Vitals Entered By: Ernestene Mention (Apr 13, 2010 3:18 PM)  O2 Flow:  Room air CC: F/U--Pt states that she did not start Prednisone./kb Is Patient Diabetic? Yes Pain Assessment Patient in pain? no        Primary Care Provider:  [name], MD  CC:  F/U--Pt states that she did not start Prednisone./kb.  History of Present Illness: F/u UTI, HTN, ARF C/o dry mouth Feeling better  Current Medications (verified): 1)  Oxycodone Hcl 15 Mg  Tabs (Oxycodone Hcl) .Marland Kitchen.. 1 By Mouth Qid As Needed Pain. Please,  Fill On or After 04/12/2010 2)  Oxycontin 20 Mg  Tb12 (Oxycodone Hcl) .Marland Kitchen.. 1 Tab By Mouth Two Times A Day Please,  Fill On or After 04/12/2010 3)  Cyclobenzaprine Hcl 10 Mg  Tabs (Cyclobenzaprine Hcl) .... Three Times A Day As Needed For Back Pain 4)  Lisinopril 40 Mg  Tabs (Lisinopril) .... Take 1/2  Tablet Daily 5)  Prilosec 20 Mg  Cpdr (Omeprazole) .... Once Daily 6)  Celebrex 200 Mg  Caps (Celecoxib) .... Two Times A Day 7)  Metformin Hcl 1000 Mg Tabs (Metformin Hcl) .... Take 1 Tablet By Mouth Two Times A Day 8)  Wellbutrin Sr 150 Mg Tb12 (Bupropion Hcl) .Marland Kitchen.. 1 Po Bid 9)  Vitamin D3 1000 Unit  Tabs (Cholecalciferol) .... Once Daily 10)  Glimepiride 1 Mg Tabs (Glimepiride) .Marland Kitchen.. 1 Po Qd 11)  Onetouch Ultra Test   Strp (Glucose Blood) .... Use Once Daily To Two Times A Day 12)  Onetouch Ultrasoft Lancets   Misc (Lancets) .... Use Once Daily To Two Times A Day 13)  Lorazepam 2 Mg Tabs (Lorazepam) .... 1/2 or 1 By Mouth Two Times A Day Prn 14)  Lidoderm 5 % Ptch (Lidocaine) .Marland Kitchen.. 1 To 3 Patches Qday 15)  Fiber 625 Mg Tabs (Calcium Polycarbophil) .... Take 4 Daily 16)  Lomotil 2.5-0.025 Mg Tabs  (Diphenoxylate-Atropine) .Marland Kitchen.. 1-2 By Mouth Q 4-6 Hours As Needed 17)  Estradiol 0.5 Mg Tabs (Estradiol) .Marland Kitchen.. 1 Every Other Day 18)  Medroxyprogesterone Acetate 2.5 Mg Tabs (Medroxyprogesterone Acetate) .Marland Kitchen.. 1 Every Other Day 19)  Ibuprofen 600 Mg Tabs (Ibuprofen) .Marland Kitchen.. 1 By Mouth Bid  Pc X 1 Wk Then As Needed For  Pain 20)  Gelnique 10 % Gel (Oxybutynin Chloride) .Marland Kitchen.. 1 On Skin Qd 21)  Ciprofloxacin Hcl 500 Mg Tabs (Ciprofloxacin Hcl) .Marland Kitchen.. 1 By Mouth Two Times A Day (Take Once  Today)  Allergies (verified): 1)  ! 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 quit 1990 Daily Caffeine  Use -2  Review of Systems  The patient denies fever, dyspnea on exertion, and abdominal pain.    Physical Exam  General:  Well developed, well nourished, in no acute distress. Mouth:  Not dry Lungs:  Clear throughout to auscultation. Heart:  Regular rate and rhythm; no murmurs, rubs,  or bruits. Abdomen:  Soft, nontender and nondistended. No masses, hepatosplenomegaly or hernias noted. Normal bowel sounds. Msk:  Lumbar-sacral spine is tender to palpation over paraspinal muscles and painfull with the ROM  B knees w/post-op changes Neurologic:  Alert and  oriented x4;  grossly normal neurologically. Skin:  Intact without significant lesions or rashes. Psych:  Alert and cooperative. tearful and slightly anxious.     Impression & Recommendations:  Problem # 1:   UTI (ICD-599.0) aggravated #2 Assessment Improved  Her updated medication list for this problem includes:    Gelnique 10 % Gel (Oxybutynin chloride) .Marland Kitchen... 1 on skin qd    Ciprofloxacin Hcl 500 Mg Tabs (Ciprofloxacin hcl) .Marland Kitchen... 1 by mouth two times a day (take once  today)  Problem # 2:  RENAL INSUFFICIENCY (ICD-588.9) Assessment: Deteriorated Renal US is pending  Labs are iImproving since the first set early last week Reduce Lisinopril dose when we restart it.  Orders: Nephrology Referral (Nephro) Dr Justin Mend  Problem # 3:  HYPERTENSION (ICD-401.9) Assessment: Improved  The following medications were removed from the medication list:    Lisinopril 40 Mg Tabs (Lisinopril) .Marland Kitchen... Take 1/2  tablet daily Her updated medication list for this problem includes:    Lisinopril 10 Mg Tabs (Lisinopril) .Marland Kitchen... 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 1 by mouth qid as needed pain. please,  fill on or after 04/12/2010 2)  Oxycontin 20 Mg Tb12 (Oxycodone hcl) .Marland Kitchen.. 1 tab by mouth two times a day please,  fill on or after 04/12/2010 3)  Cyclobenzaprine Hcl 10 Mg Tabs (Cyclobenzaprine hcl) .... Three times a day as needed for back pain 4)  Prilosec 20 Mg Cpdr (Omeprazole) .... Once daily 5)  Celebrex 200 Mg Caps (Celecoxib) .... Two times a day 6)  Metformin Hcl 1000 Mg Tabs (Metformin hcl) .... Take 1 tablet by mouth two times a day 7)  Wellbutrin Sr 150 Mg Tb12 (Bupropion hcl) .Marland Kitchen.. 1 po bid 8)  Vitamin D3 1000 Unit Tabs (Cholecalciferol) .... Once daily 9)  Glimepiride 1 Mg Tabs (Glimepiride) .Marland Kitchen.. 1 po qd 10)  Onetouch Ultra Test Strp (Glucose blood) .... Use once daily to two times a day 11)  Onetouch Ultrasoft Lancets Misc (Lancets) .... Use once daily to two times a day 12)  Lorazepam 2 Mg Tabs (Lorazepam) .... 1/2 or 1 by mouth two times a day prn 13)   Lidoderm 5 % Ptch (Lidocaine) .Marland Kitchen.. 1 to 3 patches qday 14)  Fiber 625 Mg Tabs (Calcium polycarbophil) .... Take 4 daily 15)  Lomotil 2.5-0.025 Mg Tabs (Diphenoxylate-atropine) .Marland Kitchen.. 1-2 by mouth q 4-6 hours as needed 16)  Estradiol 0.5 Mg Tabs (Estradiol) .Marland Kitchen.. 1 every other day 17)  Medroxyprogesterone Acetate 2.5 Mg Tabs (Medroxyprogesterone acetate) .Marland Kitchen.. 1 every other day 18)  Ibuprofen 600 Mg Tabs (Ibuprofen) .Marland Kitchen.. 1 by mouth bid  pc x 1 wk then as needed for  pain 19)  Gelnique 10 % Gel (Oxybutynin chloride) .Marland Kitchen.. 1 on skin qd 20)  Ciprofloxacin Hcl 500 Mg  Tabs (Ciprofloxacin hcl) .Marland Kitchen.. 1 by mouth two times a day (take once  today) 21)  Lisinopril 10 Mg Tabs (Lisinopril) .Marland Kitchen.. 1 by mouth qd 22)  Oramoist  .... 1 as two times a day dirrected  Patient Instructions: 1)  Please schedule a follow-up appointment in 3 months. 2)  BMP prior to visit, ICD-9: 995.20 3)  HbgA1C prior to visit, ICD-9: 4)  BMP in 2-3 wks 585 Prescriptions: ORAMOIST 1 as two times a day dirrected  #60 x 6   Entered and Authorized by:   Cassandria Anger MD   Signed by:   Cassandria Anger MD on 04/13/2010   Method used:   Print then Give to Patient   RxID:   [id number] LISINOPRIL 10 MG TABS (LISINOPRIL) 1 by mouth qd  #90 x 3   Entered and Authorized by:   Cassandria Anger MD   Signed by:   Cassandria Anger MD on 04/13/2010   Method used:   Electronically to        [facility] (retail)       [address]       [place], [place]  [address]       Ph: [phone]       Fax: [phone]   RxID:   [id number]

## 2011-01-12 NOTE — Progress Notes (Signed)
Summary: Lidoderm  Phone Note From Pharmacy   Summary of Call: Pharm is req refill on lidoderm patches, ok per dr Initial call taken by: Charlsie Quest,  May 16, 2009 9:49 AM    New/Updated Medications: LIDODERM 5 % PTCH (LIDOCAINE) 1 to 3 patches qday   Prescriptions: LIDODERM 5 % PTCH (LIDOCAINE) 1 to 3 patches qday  #90 x 6   Entered by:   Charlsie Quest   Authorized by:   Cassandria Anger MD   Signed by:   Charlsie Quest on 05/16/2009   Method used:   Electronically to        [facility] (retail)       [address]       [place], [place]  [address]       Ph: [phone]       Fax: [phone]   RxID:   [id number]

## 2011-01-12 NOTE — Progress Notes (Signed)
Summary: PAIN MED REFILL  Phone Note Refill Request   Refills Requested: Medication #1:  OXYCONTIN 20 MG  TB12 1 tab by mouth two times a day Please  Medication #2:  OXYCODONE HCL 30 MG  TABS 1 tab by mouth qid as needed pain Please Initial call taken by: Charlsie Quest,  April 01, 2008 2:24 PM  Follow-up for Phone Call        OK to ref Follow-up by: Cassandria Anger MD,  April 01, 2008 5:38 PM         Appended Document: PAIN MED REFILL    Phone Note Refill Request    Follow-up for Phone Call        Pt informed to pick up Follow-up by: Charlsie Quest,  April 02, 2008 11:57 AM    New/Updated Medications: OXYCONTIN 20 MG  TB12 (OXYCODONE HCL) 1 tab by mouth two times a day Please,  fill on or after 4/7/9 OXYCODONE HCL 30 MG  TABS (OXYCODONE HCL) 1 tab by mouth qid as needed pain Please,  fill on or after 4/7/9   Prescriptions: OXYCODONE HCL 30 MG  TABS (OXYCODONE HCL) 1 tab by mouth qid as needed pain Please,  fill on or after 4/7/9  #120 x 0   Entered by:   Charlsie Quest   Authorized by:   Cassandria Anger MD   Signed by:   Charlsie Quest on 04/01/2008   Method used:   Print then Give to Patient   RxID:   [id number] OXYCONTIN 20 MG  TB12 (OXYCODONE HCL) 1 tab by mouth two times a day Please,  fill on or after 4/7/9  #60 x 0   Entered by:   Charlsie Quest   Authorized by:   Cassandria Anger MD   Signed by:   Charlsie Quest on 04/01/2008   Method used:   Print then Give to Patient   RxID:   [id number]

## 2011-01-12 NOTE — Miscellaneous (Signed)
Summary: BONE DENSITY  Clinical Lists Changes  Orders: Added new Test order of T-Bone Densitometry (77080) - Signed Added new Test order of T-Lumbar Vertebral Assessment (77082) - Signed 

## 2011-01-12 NOTE — Assessment & Plan Note (Signed)
Summary: 3 MO ROV /NWS  #   Vital Signs:  Patient profile:   60 year old female Height:      62 inches Weight:      197 pounds BMI:     36.16 Temp:     98.3 degrees F oral Pulse rate:   84 / minute Pulse rhythm:   regular Resp:     16 per minute BP sitting:   120 / 78  (left arm) Cuff size:   regular  Vitals Entered By: Jonathon Resides, Gabrielle Dare) (October 28, 2010 3:49 PM) CC: CPX Is Patient Diabetic? Yes Comments pt is not taking Celebrex or Gelnique   Primary Care Provider:  [name], MD  CC:  CPX.  History of Present Illness: The patient presents for a preventive health examination   Current Medications (verified): 1)  Oxycodone Hcl 15 Mg  Tabs (Oxycodone Hcl) .Marland Kitchen.. 1 By Mouth Qid As Needed Pain. Please,  Fill On or After 10/17/2010 2)  Oxycontin 20 Mg  Tb12 (Oxycodone Hcl) .Marland Kitchen.. 1 Tab By Mouth Two Times A Day Please,  Fill On or After 10/17/2010 3)  Cyclobenzaprine Hcl 10 Mg  Tabs (Cyclobenzaprine Hcl) .... Three Times A Day As Needed For Back Pain 4)  Prilosec 20 Mg  Cpdr (Omeprazole) .... Once Daily 5)  Celebrex 200 Mg  Caps (Celecoxib) .... Two Times A Day 6)  Metformin Hcl 1000 Mg Tabs (Metformin Hcl) .... Take 1 Tablet By Mouth Two Times A Day 7)  Wellbutrin Sr 150 Mg Tb12 (Bupropion Hcl) .Marland Kitchen.. 1 Po Bid 8)  Vitamin D3 1000 Unit  Tabs (Cholecalciferol) .... Once Daily 9)  Glimepiride 1 Mg Tabs (Glimepiride) .Marland Kitchen.. 1 Po Qd 10)  Onetouch Ultra Test   Strp (Glucose Blood) .... Use Once Daily To Two Times A Day 11)  Onetouch Ultrasoft Lancets   Misc (Lancets) .... Use Once Daily To Two Times A Day 12)  Lorazepam 2 Mg Tabs (Lorazepam) .... 1/2 or 1 By Mouth Two Times A Day Prn 13)  Lidoderm 5 % Ptch (Lidocaine) .Marland Kitchen.. 1 To 3 Patches Qday 14)  Fiber 625 Mg Tabs (Calcium Polycarbophil) .... Take 4 Daily 15)  Lomotil 2.5-0.025 Mg Tabs (Diphenoxylate-Atropine) .Marland Kitchen.. 1-2 By Mouth Q 4-6 Hours As Needed 16)  Estradiol 0.5 Mg Tabs (Estradiol) .Marland Kitchen.. 1 Every Other Day 17)   Medroxyprogesterone Acetate 2.5 Mg Tabs (Medroxyprogesterone Acetate) .Marland Kitchen.. 1 Every Other Day 18)  Ibuprofen 600 Mg Tabs (Ibuprofen) .Marland Kitchen.. 1 By Mouth Bid  Pc X 1 Wk Then As Needed For  Pain 19)  Gelnique 10 % Gel (Oxybutynin Chloride) .Marland Kitchen.. 1 On Skin Qd 20)  Oramoist ..Marland Kitchen. 1 As Two Times A Day Dirrected 21)  Coreg 12.5 Mg Tabs (Carvedilol) .Marland Kitchen.. 1 By Mouth Bid  Allergies (verified): 1)  ! Erythromycin (Erythromycin Base)  Past History:  Past Surgical History: Last updated: 04/10/2008 Foramen magnum ependymoma  surgery 2003 Dr Rita Ohara Total knee replacement L 2008, R 2009  Dr Maureen Ralphs  Family History: Last updated: 08/06/2009 Family History Diabetes 1st degree relative No FH of Colon Cancer: Family History of Colitis/Crohn's:Maternal Uncle x2  Social History: Last updated: 08/06/2009 Occupation: RN Single Alcohol use-no Patient is a former smoker. 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 Looks tired... Mouth:  Not dry Lungs:  Clear throughout to auscultation. Heart:  Regular rate and rhythm; no murmurs, rubs,  or bruits. Abdomen:  Soft, nontender and nondistended. No masses, hepatosplenomegaly or hernias noted. Normal bowel sounds. Msk:  Lumbar-sacral spine is tender to palpation over paraspinal muscles and painfull with the ROM  B knees w/post-op changes Neurologic:  Alert and  oriented x4;  grossly normal neurologically. Skin:  Intact without significant lesions or rashes. Psych:  Alert and cooperative. tearful and slightly anxious.     Impression & Recommendations:  Problem # 1:  HEALTH MAINTENANCE EXAM (ICD-V70.0) Assessment  New The labs were reviewed with the patient. Health and age related issues were discussed. Available screening tests and vaccinations were discussed as well. Healthy life style including good diet and exercise was discussed.   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 1 by mouth qid as needed pain. please,  fill on or after 01/17/2011    Oxycontin 20 Mg Tb12 (Oxycodone hcl) .Marland Kitchen... 1 tab by mouth two times a day please,  fill on or after 01/17/2011    Cyclobenzaprine Hcl 10 Mg Tabs (Cyclobenzaprine hcl) .Marland Kitchen... Three times a day as needed for back pain    Celebrex 200 Mg Caps (Celecoxib) .Marland Kitchen..Marland Kitchen Two times a day    Ibuprofen 600 Mg Tabs (Ibuprofen) .Marland Kitchen... 1 by mouth bid  pc x 1 wk then as needed for  pain  Problem # 5:  KNEE PAIN (ICD-719.46) Assessment: Unchanged  Her updated medication list for this problem includes:    Oxycodone Hcl 15 Mg Tabs (Oxycodone hcl) .Marland Kitchen... 1 by mouth qid as needed pain. please,  fill on or after 01/17/2011    Oxycontin 20 Mg Tb12 (Oxycodone hcl) .Marland Kitchen... 1 tab by mouth two times a day please,  fill on or after 01/17/2011    Cyclobenzaprine Hcl 10 Mg Tabs (Cyclobenzaprine hcl) .Marland Kitchen... Three times a day as needed for back pain    Celebrex 200 Mg Caps (Celecoxib) .Marland Kitchen..Marland Kitchen Two times a day    Ibuprofen 600 Mg Tabs (Ibuprofen) .Marland Kitchen... 1 by mouth bid  pc x 1 wk then as needed for  pain  Problem # 6:  DIABETES MELLITUS, TYPE II (ICD-250.00) Assessment: Unchanged  Her updated medication list for this problem includes:    Metformin Hcl 1000 Mg Tabs (Metformin hcl) .Marland Kitchen... Take 1 tablet by mouth two times a day    Glimepiride 1 Mg Tabs (Glimepiride) .Marland Kitchen... 1 po qd  Complete Medication List: 1)   Oxycodone Hcl 15 Mg Tabs (Oxycodone hcl) .Marland Kitchen.. 1 by mouth qid as needed pain. please,  fill on or after 01/17/2011 2)  Oxycontin 20 Mg Tb12 (Oxycodone hcl) .Marland Kitchen.. 1 tab by mouth two times a day please,  fill on or after 01/17/2011 3)  Cyclobenzaprine Hcl 10 Mg Tabs (Cyclobenzaprine hcl) .... Three times a day as needed for back pain 4)  Prilosec 20 Mg Cpdr (Omeprazole) .... Once daily 5)  Celebrex 200 Mg Caps (Celecoxib) .... Two times a day 6)  Metformin Hcl 1000 Mg Tabs (Metformin hcl) .... Take 1 tablet by mouth two times a day 7)  Wellbutrin Sr 150 Mg Tb12 (Bupropion hcl) .Marland Kitchen.. 1 po  bid 8)  Vitamin D3 1000 Unit Tabs (Cholecalciferol) .... Once daily 9)  Glimepiride 1 Mg Tabs (Glimepiride) .Marland Kitchen.. 1 po qd 10)  Onetouch Ultra Test Strp (Glucose blood) .... Use once daily to two times a day 11)  Onetouch Ultrasoft Lancets Misc (Lancets) .... Use once daily to two times a day 12)  Lorazepam 2 Mg Tabs (Lorazepam) .... 1/2 or 1 by mouth two times a day prn 13)  Lidoderm 5 % Ptch (Lidocaine) .Marland Kitchen.. 1 to 3 patches qday 14)  Fiber 625 Mg Tabs (Calcium polycarbophil) .... Take 4 daily 15)  Lomotil 2.5-0.025 Mg Tabs (Diphenoxylate-atropine) .Marland Kitchen.. 1-2 by mouth q 4-6 hours as needed 16)  Estradiol 0.5 Mg Tabs (Estradiol) .Marland Kitchen.. 1 every other day 17)  Medroxyprogesterone Acetate 2.5 Mg Tabs (Medroxyprogesterone acetate) .Marland Kitchen.. 1 every other day 18)  Ibuprofen 600 Mg Tabs (Ibuprofen) .Marland Kitchen.. 1 by mouth bid  pc x 1 wk then as needed for  pain 19)  Gelnique 10 % Gel (Oxybutynin chloride) .Marland Kitchen.. 1 on skin qd 20)  Oramoist  .... 1 as two times a day dirrected 21)  Coreg 12.5 Mg Tabs (Carvedilol) .Marland Kitchen.. 1 by mouth bid  Other Orders: Radiology Referral (Radiology)  Patient Instructions: 1)  Please schedule a follow-up appointment in 3 months. 2)  BMP prior to visit, ICD-9: 3)  HbgA1C prior to visit, ICD-9:250.00 4)  Use the Sinus rinse as needed  Prescriptions: OXYCONTIN 20 MG  TB12 (OXYCODONE HCL) 1 tab by mouth two times a  day Please,  fill on or after 01/17/2011 Brand medically necessary #60 x 0   Entered and Authorized by:   Cassandria Anger MD   Signed by:   Cassandria Anger MD on 10/28/2010   Method used:   Print then Give to Patient   RxID:   [id number] OXYCODONE HCL 15 MG  TABS (OXYCODONE HCL) 1 by mouth qid as needed pain. Please,  fill on or after 01/17/2011  #120 x 0   Entered and Authorized by:   Cassandria Anger MD   Signed by:   Cassandria Anger MD on 10/28/2010   Method used:   Print then Give to Patient   RxID:   [id number] OXYCONTIN 20 MG  TB12 (OXYCODONE HCL) 1 tab by mouth two times a day Please,  fill on or after 12/17/2010 Brand medically necessary #60 x 0   Entered and Authorized by:   Cassandria Anger MD   Signed by:   Cassandria Anger MD on 10/28/2010   Method used:   Print then Give to Patient   RxID:   [id number] OXYCODONE HCL 15 MG  TABS (OXYCODONE HCL) 1 by mouth qid as needed pain. Please,  fill on or after 10/18/2011  #120 x 0   Entered and Authorized by:   Cassandria Anger MD   Signed by:   Cassandria Anger MD on 10/28/2010   Method used:   Print then Give to Patient   RxID:   [id number] LORAZEPAM 2 MG TABS (LORAZEPAM) 1/2 or 1 by mouth two times a day prn  #60 x 3   Entered and Authorized by:   Cassandria Anger MD   Signed by:   Cassandria Anger MD on 10/28/2010   Method used:   Print then Give to Patient   RxID:   [id number] OXYCONTIN 20 MG  TB12 (OXYCODONE HCL) 1 tab by mouth two times a day Please,  fill on or after 11/16/2010 Brand  medically necessary #60 x 0   Entered and Authorized by:   Cassandria Anger MD   Signed by:   Cassandria Anger MD on 10/28/2010   Method used:   Print then Give to Patient   RxID:   [id number] OXYCODONE HCL 15 MG  TABS (OXYCODONE HCL) 1 by mouth qid as needed pain. Please,  fill on or after 11/16/2010  #120 x 0   Entered and Authorized by:   Cassandria Anger MD   Signed  by:   Cassandria Anger MD on 10/28/2010   Method used:   Print then Give to Patient   RxID:   [id number]    Orders Added: 1)  Radiology Referral [Radiology] 2)  Est. Patient 40-64 years [id number]   Immunization History:  Influenza Immunization History:    Influenza:  historical (08/20/2010)  Tetanus/Td Immunization History:    Tetanus/Td:  historical (07/15/2004)   Immunization History:  Influenza Immunization History:    Influenza:  Historical (08/20/2010)  Tetanus/Td Immunization History:    Tetanus/Td:  Historical (07/15/2004)

## 2011-01-12 NOTE — Progress Notes (Signed)
Summary: Admitted to [facility]  Phone Note Outgoing Call   Call placed by: Vivia Ewing LPN,  October 14, 579FGE 3:22 PM Call placed to: Patient Summary of Call: Per Dr.Kaplan--Pt. to be admitted to [facility] Dx: diarrhea/dehydration. Pt. instructed to go to [facility] admitting, then she will be going to [place] NP notified. Initial call taken by: Vivia Ewing LPN,  October 14, 579FGE 3:24 PM

## 2011-01-12 NOTE — Progress Notes (Signed)
Summary: Refills  Phone Note Call from Patient Call back at Work Phone [phone]   Caller: Patient Call For: Cassandria Anger MD Reason for Call: Refill Medication Summary of Call: Patient came into the office. She needs a refill of Oxycontin 20 mg and Oxycodone 15 mg.  Initial call taken by: Katy Apo,  October 02, 2009 4:30 PM  Follow-up for Phone Call        OK Follow-up by: Cassandria Anger MD,  October 02, 2009 11:53 PM  Additional Follow-up for Phone Call Additional follow up Details #1::        Pt informed  Additional Follow-up by: Charlsie Quest, CMA,  October 03, 2009 3:02 PM    New/Updated Medications: OXYCONTIN 20 MG  TB12 (OXYCODONE HCL) 1 tab by mouth two times a day Please,  fill on or after 10/03/2009 [BMN] OXYCODONE HCL 15 MG  TABS (OXYCODONE HCL) 1 by mouth qid as needed pain. Please,  fill on or after 08/29/2009 Prescriptions: OXYCODONE HCL 15 MG  TABS (OXYCODONE HCL) 1 by mouth qid as needed pain. Please,  fill on or after 08/29/2009  #120 x 0   Entered by:   Charlsie Quest, CMA   Authorized by:   Cassandria Anger MD   Signed by:   Charlsie Quest, CMA on 10/03/2009   Method used:   Print then Give to Patient   RxID:   [id number] OXYCONTIN 20 MG  TB12 (OXYCODONE HCL) 1 tab by mouth two times a day Please,  fill on or after 10/03/2009 Brand medically necessary #60 x 0   Entered by:   Charlsie Quest, CMA   Authorized by:   Cassandria Anger MD   Signed by:   Charlsie Quest, CMA on 10/03/2009   Method used:   Print then Give to Patient   RxID:   [id number]

## 2011-01-12 NOTE — Progress Notes (Signed)
Summary: Lasix  Phone Note Refill Request   Refills Requested: Medication #1:  LASIX 40 MG  TABS as needed Initial call taken by: Charlsie Quest,  March 27, 2008 4:32 PM    New/Updated Medications: LASIX 40 MG  TABS (FUROSEMIDE) 1 qd as needed   Prescriptions: LASIX 40 MG  TABS (FUROSEMIDE) 1 qd as needed  #30 x 6   Entered by:   Charlsie Quest   Authorized by:   Cassandria Anger MD   Signed by:   Charlsie Quest on 03/27/2008   Method used:   Electronically sent to ...       [facility]       [place], [place]  [address]       Ph: [phone] or [phone]       Fax: [phone]   RxID:   [id number]

## 2011-01-12 NOTE — Assessment & Plan Note (Signed)
Summary: 3 mo rov /nws $50   Vital Signs:  Patient profile:   60 year old female Weight:      180 pounds Temp:     98.2 degrees F oral Pulse rate:   77 / minute BP sitting:   120 / 80  (left arm)  Vitals Entered By: Doralee Albino (October 29, 2009 2:22 PM) CC: f/u Is Patient Diabetic? Yes   Primary Care Provider:  [name], MD  CC:  f/u.  History of Present Illness: The patient presents for a follow up of hypertension, diabetes, hyperlipidemia, OA, LBP C/o hands cracking skin  Current Medications (verified): 1)  Lisinopril 40 Mg  Tabs (Lisinopril) .... Take 1/2  Tablet Daily 2)  Prilosec 20 Mg  Cpdr (Omeprazole) .... Once Daily 3)  Celebrex 200 Mg  Caps (Celecoxib) .... Two Times A Day 4)  Oxycontin 20 Mg  Tb12 (Oxycodone Hcl) .Marland Kitchen.. 1 Tab By Mouth Two Times A Day Please,  Fill On or After 10/03/2009 5)  Oxycodone Hcl 15 Mg  Tabs (Oxycodone Hcl) .Marland Kitchen.. 1 By Mouth Qid As Needed Pain. Please,  Fill On or After 08/29/2009 6)  Metformin Hcl 1000 Mg Tabs (Metformin Hcl) .... Take 1 Tablet By Mouth Two Times A Day 7)  Wellbutrin Sr 150 Mg Tb12 (Bupropion Hcl) .Marland Kitchen.. 1 Po Bid 8)  Cyclobenzaprine Hcl 10 Mg  Tabs (Cyclobenzaprine Hcl) .... Three Times A Day As Needed For Back Pain 9)  Vitamin D3 1000 Unit  Tabs (Cholecalciferol) .... Once Daily 10)  Detrol La 4 Mg Cp24 (Tolterodine Tartrate) .Marland Kitchen.. 1 Tablet By Mouth Daily 11)  Glimepiride 1 Mg Tabs (Glimepiride) .Marland Kitchen.. 1 Po Qd 12)  Onetouch Ultra Test   Strp (Glucose Blood) .... Use Once Daily To Two Times A Day 13)  Onetouch Ultrasoft Lancets   Misc (Lancets) .... Use Once Daily To Two Times A Day 14)  Lorazepam 2 Mg Tabs (Lorazepam) .... 1/2 or 1 By Mouth Two Times A Day Prn 15)  Lidoderm 5 % Ptch (Lidocaine) .Marland Kitchen.. 1 To 3 Patches Qday 16)  Fiber 625 Mg Tabs (Calcium Polycarbophil) .... Take 4 Daily 17)  Lomotil 2.5-0.025 Mg Tabs (Diphenoxylate-Atropine) .Marland Kitchen.. 1-2 By Mouth Q 4-6 Hours As Needed 18)  Entocort Ec 3 Mg Xr24h-Cap  (Budesonide) .... Take 3 P.o. Q.day 19)  Estradiol 0.5 Mg Tabs (Estradiol) .Marland Kitchen.. 1 Every Other Day 20)  Medroxyprogesterone Acetate 2.5 Mg Tabs (Medroxyprogesterone Acetate) .Marland Kitchen.. 1 Every Other Day  Allergies: 1)  ! 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 quit 1990 Daily Caffeine Use -2  Physical Exam  General:  Well developed, well nourished, no acute distress. Ears:  Normal auditory acuity. Mouth:  No deformity or lesions, dentition normal. Lungs:  Clear throughout to auscultation. Heart:  Regular rate and rhythm; no murmurs, rubs,  or bruits. Abdomen:  Soft, nontender and nondistended. No masses, hepatosplenomegaly or hernias noted. Normal bowel sounds. Msk:  Lumbar-sacral spine is tender to palpation over paraspinal muscles and painfull with the ROM  B knees w/post-op changes Extremities:  No clubbing, cyanosis, edema or deformities noted. Neurologic:  Alert and  oriented x4;  grossly normal neurologically. Skin:  Intact without significant lesions or rashes. Psych:  Alert and cooperative. Normal mood and affect.   Impression & Recommendations:  Problem # 1:  RENAL INSUFFICIENCY (ICD-588.9) Assessment Improved Watching The labs were reviewed with the patient.   Problem # 2:  DIABETES MELLITUS, TYPE II (ICD-250.00) Assessment: Improved  Her updated medication list for this problem includes:    Lisinopril 40 Mg Tabs (Lisinopril) .Marland Kitchen... Take 1/2  tablet daily    Metformin Hcl 1000 Mg Tabs (Metformin hcl) .Marland Kitchen... Take 1 tablet by mouth two times a day    Glimepiride 1 Mg Tabs (Glimepiride) .Marland Kitchen... 1 po  qd  Problem # 3:  HYPERCHOLESTEROLEMIA (ICD-272.0) Assessment: Comment Only On prescription therapy   Problem # 4:  DIARRHEA (ICD-787.91) Assessment: Improved  Her updated medication list for this problem includes:    Lomotil 2.5-0.025 Mg Tabs (Diphenoxylate-atropine) .Marland Kitchen... 1-2 by mouth q 4-6 hours as needed  Problem # 5:  DEPRESSION (ICD-311) Assessment: Unchanged  Her updated medication list for this problem includes:    Wellbutrin Sr 150 Mg Tb12 (Bupropion hcl) .Marland Kitchen... 1 po bid    Lorazepam 2 Mg Tabs (Lorazepam) .Marland Kitchen... 1/2 or 1 by mouth two times a day prn  Problem # 6:  GERD (ICD-530.81) Assessment: Unchanged  Her updated medication list for this problem includes:    Prilosec 20 Mg Cpdr (Omeprazole) ..... Once daily  Problem # 7:  ECZEMA (ICD-692.9) hands Assessment: Unchanged     Triamcinolone Acetonide 0.1 % Oint (Triamcinolone acetonide) ..... Use 1-2 times a day  Complete Medication List: 1)  Oxycodone Hcl 15 Mg Tabs (Oxycodone hcl) .Marland Kitchen.. 1 by mouth qid as needed pain. please,  fill on or after 12/13/2009 2)  Oxycontin 20 Mg Tb12 (Oxycodone hcl) .Marland Kitchen.. 1 tab by mouth two times a day please,  fill on or after 12/13/2009 3)  Cyclobenzaprine Hcl 10 Mg Tabs (Cyclobenzaprine hcl) .... Three times a day as needed for back pain 4)  Lisinopril 40 Mg Tabs (Lisinopril) .... Take 1/2  tablet daily 5)  Prilosec 20 Mg Cpdr (Omeprazole) .... Once daily 6)  Celebrex 200 Mg Caps (Celecoxib) .... Two times a day 7)  Metformin Hcl 1000 Mg Tabs (Metformin hcl) .... Take 1 tablet by mouth two times a day 8)  Wellbutrin Sr 150 Mg Tb12 (Bupropion hcl) .Marland Kitchen.. 1 po bid 9)  Vitamin D3 1000 Unit Tabs (Cholecalciferol) .... Once daily 10)  Detrol La 4 Mg Cp24 (Tolterodine tartrate) .Marland Kitchen.. 1 tablet by mouth daily 11)  Glimepiride 1 Mg Tabs (Glimepiride) .Marland Kitchen.. 1 po qd 12)  Onetouch Ultra Test Strp (Glucose blood) .... Use once daily to two times a day 13)  Onetouch Ultrasoft Lancets Misc (Lancets) .... Use  once daily to two times a day 14)  Lorazepam 2 Mg Tabs (Lorazepam) .... 1/2 or 1 by mouth two times a day prn 15)  Lidoderm 5 % Ptch (Lidocaine) .Marland Kitchen.. 1 to 3 patches qday 16)  Fiber 625 Mg Tabs (Calcium polycarbophil) .... Take 4 daily 17)  Lomotil 2.5-0.025 Mg Tabs (Diphenoxylate-atropine) .Marland Kitchen.. 1-2 by mouth q 4-6 hours as needed 18)  Entocort Ec 3 Mg Xr24h-cap (Budesonide) .... Take 3 p.o. q.day 19)  Estradiol 0.5 Mg Tabs (Estradiol) .Marland Kitchen.. 1 every other day 20)  Medroxyprogesterone Acetate 2.5 Mg Tabs (Medroxyprogesterone acetate) .Marland Kitchen.. 1 every other day 21)  Triamcinolone Acetonide 0.1 % Oint (Triamcinolone acetonide) .... Use 1-2 times a day  Patient Instructions: 1)  Please schedule a follow-up appointment in 3 months. 2)  BMP prior to visit, ICD-9: 3)  Hepatic  Panel prior to visit, ICD-9: 4)  HbgA1C prior to visit, ICD-9:250.00 Prescriptions: TRIAMCINOLONE ACETONIDE 0.1 % OINT (TRIAMCINOLONE ACETONIDE) Use 1-2 times a day  #120g x 3   Entered and Authorized by:   Cassandria Anger MD   Signed by:   Cassandria Anger MD on 10/29/2009   Method used:   Print then Give to Patient   RxID:   [id number] OXYCONTIN 20 MG  TB12 (OXYCODONE HCL) 1 tab by mouth two times a day Please,  fill on or after 12/13/2009 Brand medically necessary #60 x 0   Entered and Authorized by:   Cassandria Anger MD   Signed by:   Cassandria Anger MD on 10/29/2009   Method used:   Print then Give to Patient   RxID:   [id number] OXYCODONE HCL 15 MG  TABS (OXYCODONE HCL) 1 by mouth qid as needed pain. Please,  fill on or after 12/13/2009  #120 x 0   Entered and Authorized by:   Cassandria Anger MD   Signed by:   Cassandria Anger MD on 10/29/2009   Method used:   Print then Give to Patient   RxID:   [id number] LORAZEPAM 2 MG TABS (LORAZEPAM) 1/2 or 1 by mouth two times a day prn  #60 x 2   Entered and Authorized by:   Cassandria Anger MD   Signed by:   Cassandria Anger MD on  10/29/2009   Method used:   Print then Give to Patient   RxID:   [id number] OXYCONTIN 20 MG  TB12 (OXYCODONE HCL) 1 tab by mouth two times a day Please,  fill on or after 12/24/2008 Brand medically necessary #60 x 0   Entered and Authorized by:   Cassandria Anger MD   Signed by:   Cassandria Anger MD on 10/29/2009   Method used:   Print then Give to Patient   RxID:   [id number] OXYCODONE HCL 15 MG  TABS (OXYCODONE HCL) 1 by mouth qid as needed pain. Please,  fill on or after 11/12/2009  #120 x 0   Entered and Authorized by:   Cassandria Anger MD   Signed by:   Cassandria Anger MD on 10/29/2009   Method used:   Print then Give to Patient   RxID:   [id number]

## 2011-01-12 NOTE — Progress Notes (Signed)
Summary: REFILL - Tuesday  Phone Note Refill Request   Refills Requested: Medication #1:  OXYCODONE HCL 15 MG  TABS 1 by mouth qid as needed pain. Please  Medication #2:  OXYCONTIN 20 MG  TB12 1 tab by mouth two times a day Please Pt due for refill 7/5, aware MD is out of the office.   Initial call taken by: Charlsie Quest, [place],  June 12, 2010 4:24 PM  Follow-up for Phone Call        ok to ref Follow-up by: Cassandria Anger MD,  June 15, 2010 11:27 PM  Additional Follow-up for Phone Call Additional follow up Details #1::        Pt informed to pick up  Additional Follow-up by: Charlsie Quest, CMA,  June 16, 2010 6:24 PM    New/Updated Medications: OXYCODONE HCL 15 MG  TABS (OXYCODONE HCL) 1 by mouth qid as needed pain. Please,  fill on or after 06/16/2010 OXYCONTIN 20 MG  TB12 (OXYCODONE HCL) 1 tab by mouth two times a day Please,  fill on or after 06/16/2010 [BMN] Prescriptions: OXYCONTIN 20 MG  TB12 (OXYCODONE HCL) 1 tab by mouth two times a day Please,  fill on or after 06/16/2010 Brand medically necessary #60 x 0   Entered by:   Charlsie Quest, CMA   Authorized by:   Cassandria Anger MD   Signed by:   Charlsie Quest, CMA on 06/16/2010   Method used:   Print then Give to Patient   RxID:   [id number] OXYCODONE HCL 15 MG  TABS (OXYCODONE HCL) 1 by mouth qid as needed pain. Please,  fill on or after 06/16/2010  #120 x 0   Entered by:   Charlsie Quest, CMA   Authorized by:   Cassandria Anger MD   Signed by:   Charlsie Quest, CMA on 06/16/2010   Method used:   Print then Give to Patient   RxID:   [id number]

## 2011-01-12 NOTE — Progress Notes (Signed)
Summary: diarrhea/abd. pain  Phone Note Call from Patient Call back at Work Phone [phone]   Caller: Patient Call For: perry Reason for Call: Talk to Nurse Summary of Call: Patient has severe diarrhea and medication did not help her states that she had fever on 7-3 and she had pain all over wants to know what to do  Initial call taken by: Ronalee Red,  June 17, 2009 11:50 AM  Follow-up for Phone Call        Pt. completed course of Xifaxin and Flagyl that was ordered 06/02/09. is still having 5-6watery  stools /day.Denies seeing any blood of mucus.Had a fever x1 while on a plane back from [place] relieved by Tylenoll wihtout any re-occurence.Has generalized abd. pain prior to stool only.No nausea or vomiting..Initial symptoms started on 05/31/2009. Follow-up by: Abel Presto RN,  June 17, 2009 1:45 PM  Additional Follow-up for Phone Call Additional follow up Details #1::        She needs to have the following: #1. CBC, comprehensive metabolic panel #2. Stool for ova and parasites, Clostridium difficile toxin, enteric pathogens #3. Office visit this week with me Additional Follow-up by: Irene Shipper MD,  June 17, 2009 2:32 PM    Additional Follow-up for Phone Call Additional follow up Details #2::    Pt.given appt. for tomorrow and she will have labs done         Follow-up by: Abel Presto RN,  June 17, 2009 3:59 PM

## 2011-01-12 NOTE — Assessment & Plan Note (Signed)
Summary: 3 MO FU/$50/PN   Vital Signs:  Patient profile:   60 year old female Weight:      180 pounds Temp:     97.3 degrees F oral Pulse rate:   80 / minute BP sitting:   164 / 84  (left arm)  Vitals Entered By: Doralee Albino (July 30, 2009 1:17 PM) CC: f/u Is Patient Diabetic? Yes   Primary Care Provider:  [name], MD  CC:  f/u.  History of Present Illness: The patient presents for a follow up of back pain, anxiety, depression and DM. Had a diarrhea a lot: Lisinopril was held   Current Medications (verified): 1)  Lisinopril 40 Mg  Tabs (Lisinopril) .... Once Daily 2)  Prilosec 20 Mg  Cpdr (Omeprazole) .... Once Daily 3)  Celebrex 200 Mg  Caps (Celecoxib) .... Two Times A Day 4)  Oxycontin 20 Mg  Tb12 (Oxycodone Hcl) .Marland Kitchen.. 1 Tab By Mouth Two Times A Day Please,  Fill On or After 06/28/2009 5)  Oxycodone Hcl 15 Mg  Tabs (Oxycodone Hcl) .Marland Kitchen.. 1 By Mouth Qid As Needed Pain. Please,  Fill On or After 06/28/2009 6)  Metformin Hcl 1000 Mg Tabs (Metformin Hcl) .... Take 1 Tablet By Mouth Two Times A Day 7)  Wellbutrin Sr 150 Mg Tb12 (Bupropion Hcl) .Marland Kitchen.. 1 Po Bid 8)  Cyclobenzaprine Hcl 10 Mg  Tabs (Cyclobenzaprine Hcl) .... Three Times A Day As Needed For Back Pain 9)  Vitamin D3 1000 Unit  Tabs (Cholecalciferol) .... Once Daily 10)  Detrol La 4 Mg Cp24 (Tolterodine Tartrate) .Marland Kitchen.. 1 Tablet By Mouth Daily 11)  Glimepiride 1 Mg Tabs (Glimepiride) .Marland Kitchen.. 1 Po Qd 12)  Onetouch Ultra Test   Strp (Glucose Blood) .... Use Once Daily To Two Times A Day 13)  Onetouch Ultrasoft Lancets   Misc (Lancets) .... Use Once Daily To Two Times A Day 14)  Lorazepam 2 Mg Tabs (Lorazepam) .... 1/2 or 1 By Mouth Two Times A Day Prn 15)  Femhrt Low Dose 0.5-2.5 Mg-Mcg Tabs (Norethindrone-Eth Estradiol) .... Once Daily 16)  Lunesta 3 Mg Tabs (Eszopiclone) .... One By Mouth At Bedtime As Needed 17)  Lidoderm 5 % Ptch (Lidocaine) .Marland Kitchen.. 1 To 3 Patches Qday 18)  Fiber 625 Mg Tabs (Calcium Polycarbophil)  .... Take 4 Daily 19)  Lomotil 2.5-0.025 Mg Tabs (Diphenoxylate-Atropine) .Marland Kitchen.. 1-2 By Mouth Q 4-6 Hours As Needed 20)  Entocort Ec 3 Mg Xr24h-Cap (Budesonide) .... Take 3 P.o. Q.day  Allergies: 1)  ! 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         No n/v/d  Physical Exam  General:  Well developed, well nourished, no acute distress. Eyes:  PERRLA, no icterus. Ears:  Normal auditory acuity. Nose:  No deformity, discharge,  or lesions. Mouth:  No deformity or lesions, dentition normal. Neck:  Supple; no masses or thyromegaly. Lungs:  Clear throughout to auscultation. Heart:  Regular rate and rhythm;  no murmurs, rubs,  or bruits. Abdomen:  Soft, nontender and nondistended. No masses, hepatosplenomegaly or hernias noted. Normal bowel sounds. Msk:  Symmetrical with no gross deformities. Normal posture. Extremities:  No clubbing, cyanosis, edema or deformities noted. Neurologic:  Alert and  oriented x4;  grossly normal neurologically. Skin:  Intact without significant lesions or rashes. Psych:  Alert and cooperative. Normal mood and affect.   Impression & Recommendations:  Problem # 1:  RENAL INSUFFICIENCY (ICD-588.9) Assessment Improved See "Patient Instructions".  Problem # 2:  DIARRHEA (ICD-787.91) resolved  on Rx Assessment: Comment Only  Her updated medication list for this problem includes:    Lomotil 2.5-0.025 Mg Tabs (Diphenoxylate-atropine) .Marland Kitchen... 1-2 by mouth q 4-6 hours as needed  Problem # 3:  COLITIS (ICD-558.9) Assessment: Comment Only  Her updated medication list for this problem includes:    Metformin Hcl 1000 Mg Tabs (Metformin hcl) .Marland Kitchen... Take 1 tablet by mouth two times a day    Glimepiride 1 Mg Tabs (Glimepiride) .Marland Kitchen... 1 po qd See "Patient Instructions".  Problem # 4:  HYPERTENSION (ICD-401.9) Assessment: Deteriorated See "Patient Instructions".  Her updated medication list for this problem includes:    Lisinopril 40 Mg Tabs (Lisinopril) ..... Once daily  Complete Medication List: 1)  Lisinopril 40 Mg Tabs (Lisinopril) .... Once daily 2)  Prilosec 20 Mg Cpdr (Omeprazole) .... Once daily 3)  Celebrex 200 Mg Caps (Celecoxib) .... Two times a day 4)  Oxycontin 20 Mg Tb12 (Oxycodone hcl) .Marland Kitchen.. 1 tab by mouth two times a day please,  fill on or after 08/29/2009 5)  Oxycodone Hcl 15 Mg Tabs (Oxycodone hcl) .Marland Kitchen.. 1 by mouth qid as needed pain. please,  fill on or after 08/29/2009 6)  Metformin Hcl 1000 Mg Tabs (Metformin hcl) .... Take 1 tablet by mouth two times a day 7)  Wellbutrin Sr 150 Mg Tb12 (Bupropion hcl) .Marland Kitchen.. 1 po bid 8)  Cyclobenzaprine Hcl 10 Mg Tabs (Cyclobenzaprine hcl) .... Three times a day as needed for back pain 9)  Vitamin D3 1000 Unit Tabs (Cholecalciferol) .... Once daily 10)  Detrol La 4 Mg Cp24 (Tolterodine tartrate) .Marland Kitchen.. 1 tablet by mouth daily 11)  Glimepiride 1 Mg Tabs (Glimepiride) .Marland Kitchen.. 1 po qd 12)  Onetouch Ultra Test Strp (Glucose blood) .... Use once daily to two times a day 13)  Onetouch Ultrasoft Lancets Misc (Lancets) .... Use once daily to two times a day 14)  Lorazepam 2 Mg Tabs (Lorazepam) .... 1/2 or 1 by mouth two times a day prn 15)  Femhrt Low Dose 0.5-2.5 Mg-mcg Tabs (Norethindrone-eth estradiol) .... Once daily 16)  Lunesta 3 Mg Tabs  (Eszopiclone) .... One by mouth at bedtime as needed 17)  Lidoderm 5 % Ptch (Lidocaine) .Marland Kitchen.. 1 to 3 patches qday 18)  Fiber 625 Mg Tabs (Calcium polycarbophil) .... Take 4 daily 19)  Lomotil 2.5-0.025 Mg Tabs (Diphenoxylate-atropine) .Marland Kitchen.. 1-2 by mouth q 4-6 hours as needed 20)  Entocort Ec 3 Mg Xr24h-cap (Budesonide) .... Take 3 p.o. q.day 21)  Cipro 500 Mg Tabs (Ciprofloxacin hcl) .Marland Kitchen.. 1 by mouth 2 times daily  Patient Instructions: 1)  Try to eat more raw plant food, fresh and dry fruit, raw almonds, leafy vegetables, whole foods and less red meat, less animal fat. Poultry and fish is better for you than pork and beef. Avoid processed foods (canned soups, hot dogs, sausage, bacon , frozen dinners). Avoid corn syrup, high fructose syrup or aspartam and Splenda  containing drinks. Honey, Agave and  Stevia are better sweeteners. Make your own  dressing with olive oil, wine vinegar, lemon juce, garlic etc. for your salads. 2)  Try a Gluten free diet 3)  Start taking a chair yoga class  4)  Take Lisinopril 1/2 tab a day Prescriptions: OXYCODONE HCL 15 MG  TABS (OXYCODONE HCL) 1 by mouth qid as needed pain. Please,  fill on or after 08/29/2009  #120 x 0   Entered and Authorized by:   Cassandria Anger MD   Signed by:   Cassandria Anger MD on 07/30/2009   Method used:   Print then Give to Patient   RxID:   [id number] OXYCONTIN 20 MG  TB12 (OXYCODONE HCL) 1 tab by mouth two times a day Please,  fill on or after 08/29/2009 Brand medically necessary #60 x 0   Entered and Authorized by:   Cassandria Anger MD   Signed by:   Cassandria Anger MD on 07/30/2009   Method used:   Print then Give to Patient   RxID:   [id number] WELLBUTRIN SR 150 MG TB12 (BUPROPION HCL) 1 po bid  #180 x 3   Entered and Authorized by:   Cassandria Anger MD   Signed by:   Cassandria Anger MD on 07/30/2009   Method used:   Print then Give to Patient   RxID:   [id number] OXYCODONE HCL 15 MG   TABS (OXYCODONE HCL) 1 by mouth qid as needed pain. Please,  fill on or after 07/29/2009  #120 x 0   Entered and Authorized by:   Cassandria Anger MD   Signed by:   Cassandria Anger MD on 07/30/2009   Method used:   Print then Give to Patient   RxID:   [id number] OXYCONTIN 20 MG  TB12 (OXYCODONE HCL) 1 tab by mouth two times a day Please,  fill on or after 07/29/2009 Brand medically necessary #60 x 0   Entered and Authorized by:   Cassandria Anger MD   Signed by:   Cassandria Anger MD on 07/30/2009   Method used:   Print then Give to Patient   RxID:   [id number] CELEBREX 200 MG  CAPS (CELECOXIB) two times a day  #180 x 3   Entered and Authorized by:   Cassandria Anger MD   Signed by:   Cassandria Anger MD on 07/30/2009   Method used:   Print then Give to Patient   RxID:   [id number] GLIMEPIRIDE 1 MG TABS (GLIMEPIRIDE) 1 po qd  #90 x 3   Entered and Authorized by:   Cassandria Anger MD   Signed by:   Cassandria Anger MD on 07/30/2009   Method used:   Print then Give to Patient   RxID:   [id number] DETROL LA 4 MG CP24 (TOLTERODINE TARTRATE) 1 tablet by mouth daily  #90 x 3   Entered and Authorized by:   Cassandria Anger MD   Signed by:   Cassandria Anger MD on 07/30/2009   Method used:   Print then Give to Patient   [id number] CYCLOBENZAPRINE HCL 10 MG  TABS (CYCLOBENZAPRINE HCL) three times a day as needed for back pain  #270 x 1   Entered and Authorized by:   Cassandria Anger MD   Signed by:   Cassandria Anger MD on 07/30/2009   Method used:   Print then Give to Patient   RxID:  [id number] CIPRO 500 MG TABS (CIPROFLOXACIN HCL) 1 by mouth 2 times daily  #20 x 0   Entered and Authorized by:   Cassandria Anger MD   Signed by:   Cassandria Anger MD on 07/30/2009   Method used:   Print then Give to Patient   RxID:   [id number]

## 2011-01-12 NOTE — Discharge Summary (Signed)
Summary: DISCHARGE SUMMARY   NAMECHRISTLYN, [name]                 ACCOUNT NO.:  [id number]      MEDICAL RECORD NO.:  [id number]          PATIENT TYPE:  INP      LOCATION:  1306                         FACILITY:  [facility]      PHYSICIAN:  Sandy Salaam. Deatra Ina, MD,FACGDATE OF BIRTH:  1951/11/16      DATE OF ADMISSION:  09/25/2008   DATE OF DISCHARGE:  09/30/2008                                  DISCHARGE SUMMARY      DISCHARGE DIAGNOSES:   1. Infectious enterocolitis.  Terminal ileum.  Biopsies revealed mild       active ileitis.  Colon biopsy showed severe active mucosal colitis.   2. Adenomatous colon polyps.   3. Acute kidney injury, resolved by the time of discharge.      DISCHARGE MEDICATIONS:   1. Lisinopril 40 mg daily.   2. Glucophage 1000 mg b.i.d.   3. Ativan 2 mg at bedtime.   4. Omeprazole 20 mg 2 in the morning, 1 in the evening.   5. Tizanidine 4 mg 1-2 times a day.   6. MS Contin 15 mg twice daily.   7. Omeprazole 20 mg 2 in the morning, 1 in the evening.   8. Flagyl 250 mg p.o. 4 times a day.   9. Xifaxan 400 mg twice a day for a total of 10 days.  (The patient       had received 4-5 days of Xifaxan in the hospital until prescription       finished, total of 10 days).   10.Robinul Forte 10 mg 1-2 tablets every 4-6 hours as needed.   11.Florastor 1 tablet twice a day for 2 weeks.      CONSULTATIONS:  None requested this admission.      PROCEDURES:  Colonoscopy with polypectomy and random colon biopsies.      HOSPITAL COURSE:  Ms. Paper was admitted September 25, 2008 for diarrhea   and dehydration.  The patient had been on vacation in [place] when she   developed the diarrhea on the 3rd or 4th day of her vacation.  The   patient took Cipro which she brought with her just in case it was   needed.  While on Cipro for several days the patient's diarrhea   resolved, but upon finishing the course of antibiotics the patient's   diarrhea returned.  She used Imodium for the  remainder of her vacation.   The patient is a nurse here in the endoscopy lab.  She was unable to   work secondary to profuse diarrhea.  The patient was admitted for volume   repletion and further evaluation.  On admission the patient's blood   pressure was stable at 129/81, but she was tachycardic with a heart rate   of 115 and had a low grade fever of 100.      ADMISSION LABORATORIES:  Labs were significant for a BUN of 56 and a   creatinine of 1.67.  The patient's CBC was normal.  The patient was   volume repleted, and  the next day her BUN was down to 33, creatinine   down to 1.02.  Her renal function normalized on the 2nd day of   admission.  Stool studies including O&P and 3 C. difficile studies were   negative.  Stool culture was negative as well.  Fecal lactoferrin was   positive.  Because of continued diarrhea despite antibiotics, the   patient was prepped and scheduled for a colonoscopy.  The patient had   mucosal abnormalities from the ileum to the rectum.  There was mild   friability and erythema present.  Additionally the patient had a 10 mm   sessile polyp removed in the sigmoid colon at the time of procedure.   Pathology as described in the discharge diagnoses.  By the 3rd day of   admission, the patient was feeling much better with less diarrhea.  On   the 4th day of admission her diet was advanced to a soft low residue   diet.  On September 30, 2008 the patient showed further improvement, and   was discharged home in stable condition on above medications.  The   patient was given an appointment to follow up with [name], her   primary gastroenterologist, November the 25th at 9:30 a.m.  She was to   continue a low residue diabetic diet.  The patient was given enough   Flagyl and Xifaxan to complete a full 10-day course of the antibiotics.               Tye Savoy, NP               Sandy Salaam. Deatra Ina, MD,FACG   Electronically Signed

## 2011-01-12 NOTE — Progress Notes (Signed)
Summary: refill / pain med  Phone Note Call from Patient   Summary of Call: pt came in and filled out walk in form late friday and is requesting refill on oxycontin 20 mg and oxycodone 15mg   Initial call taken by: Hector Brunswick,  December 30, 2008 8:49 AM  Follow-up for Phone Call        OK to ref both Follow-up by: Cassandria Anger MD,  December 30, 2008 5:21 PM  Additional Follow-up for Phone Call Additional follow up Details #1::        LMOVM (home phone) for pt to p/u rx after 12pm. Called work# and told pt not at that office today Additional Follow-up by: Estell Harpin CMA,  December 31, 2008 8:34 AM    New/Updated Medications: OXYCONTIN 20 MG  TB12 (OXYCODONE HCL) 1 tab by mouth two times a day Please,  fill on or after 12/29/2008 OXYCODONE HCL 15 MG  TABS (OXYCODONE HCL) 1 by mouth qid as needed pain. Please,  fill on or after 12/29/2008   Prescriptions: OXYCODONE HCL 15 MG  TABS (OXYCODONE HCL) 1 by mouth qid as needed pain. Please,  fill on or after 12/29/2008  #120 x 0   Entered by:   Estell Harpin CMA   Authorized by:   Cassandria Anger MD   Signed by:   Estell Harpin CMA on 12/31/2008   Method used:   Print then Give to Patient   RxID:   [id number] OXYCONTIN 20 MG  TB12 (OXYCODONE HCL) 1 tab by mouth two times a day Please,  fill on or after 12/29/2008  #60 x 0   Entered by:   Estell Harpin CMA   Authorized by:   Cassandria Anger MD   Signed by:   Estell Harpin CMA on 12/31/2008   Method used:   Print then Give to Patient   [id number]   Appended Document: refill / pain med called pt again and left message to pick up rx.Marland KitchenMarland KitchenMarland Kitchen

## 2011-01-12 NOTE — Assessment & Plan Note (Signed)
Summary: 3 MO ROV /NWS $50   Vital Signs:  Patient Profile:   60 Years Old Female Weight:      208 pounds Temp:     96.9 degrees F oral Pulse rate:   112 / minute BP sitting:   120 / 80  (left arm)  Vitals Entered By: Doralee Albino (January 15, 2009 2:55 PM)                 Chief Complaint:  Multiple medical problems or concerns.  History of Present Illness: The patient presents for a follow up of back pain, anxiety, depression and headaches, DM.     Prior Medications Reviewed Using: Medication Bottles  Prior Medication List:  LISINOPRIL 40 MG  TABS (LISINOPRIL) once daily TIZANIDINE HCL 4 MG  TABS (TIZANIDINE HCL) 1-2 once daily PRILOSEC 20 MG  CPDR (OMEPRAZOLE) once daily CELEBREX 200 MG  CAPS (CELECOXIB) two times a day LASIX 40 MG  TABS (FUROSEMIDE) 1 qd as needed KLOR-CON M10 10 MEQ  TBCR (POTASSIUM CHLORIDE CRYS CR) once daily as needed OXYCONTIN 20 MG  TB12 (OXYCODONE HCL) 1 tab by mouth two times a day Please,  fill on or after 12/29/2008 OXYCODONE HCL 15 MG  TABS (OXYCODONE HCL) 1 by mouth qid as needed pain. Please,  fill on or after 12/29/2008 METFORMIN HCL 1000 MG TABS (METFORMIN HCL) Take 1 tablet by mouth two times a day WELLBUTRIN SR 150 MG TB12 (BUPROPION HCL) 1 po bid CYCLOBENZAPRINE HCL 10 MG  TABS (CYCLOBENZAPRINE HCL) three times a day as needed for back pain VITAMIN D3 1000 UNIT  TABS (CHOLECALCIFEROL) once daily DETROL LA 4 MG CP24 (TOLTERODINE TARTRATE) 1 tablet by mouth daily GLIMEPIRIDE 1 MG TABS (GLIMEPIRIDE) 1 po qd ONETOUCH ULTRA TEST   STRP (GLUCOSE BLOOD) use once daily to two times a day ONETOUCH ULTRASOFT LANCETS   MISC (LANCETS) use once daily to two times a day LORAZEPAM 2 MG TABS (LORAZEPAM) 1/2 or 1 by mouth two times a day prn   Current Allergies (reviewed today): ! ERYTHROMYCIN (ERYTHROMYCIN BASE)  Past Medical History:    Reviewed history from 11/08/2007 and no changes required:       Diabetes mellitus, type II       GERD       Hypertension       IBS       FMS       Anxiety       Depression       Low back pain       Osteoarthritis       Foramen magnum ependymoma 2003       L eye vitreous humor liquification  Past Surgical History:    Reviewed history from 04/10/2008 and no changes required:       Foramen magnum ependymoma  surgery 2003 Dr Rita Ohara       Total knee replacement L 2008, R 2009  Dr Maureen Ralphs   Family History:    Reviewed history from 11/08/2007 and no changes required:       Family History Diabetes 1st degree relative  Social History:    Reviewed history from 11/08/2007 and no changes required:       Occupation: Therapist, sports       Single       Alcohol use-no    Review of Systems  The patient denies chest pain, dyspnea on exertion, and abdominal pain.         LBP   Physical  Exam  General:     overweight-appearing.   Nose:     External nasal examination shows no deformity or inflammation. Nasal mucosa are pink and moist without lesions or exudates. Mouth:     Oral mucosa and oropharynx without lesions or exudates.  Teeth in good repair. Lungs:     Normal respiratory effort, chest expands symmetrically. Lungs are clear to auscultation, no crackles or wheezes. Heart:     Normal rate and regular rhythm. S1 and S2 normal without gallop, murmur, click, rub or other extra sounds. Abdomen:     Bowel sounds positive,abdomen soft and non-tender without masses, organomegaly or hernias noted. Msk:     Lumbar-sacral spine is tender to palpation over paraspinal muscles and painfull with the ROM, knees were tender  Extremities:     No clubbing, cyanosis, edema, or deformity noted with normal full range of motion of all joints.   Neurologic:     No cranial nerve deficits noted. Station and gait are normal. Plantar reflexes are down-going bilaterally. DTRs are symmetrical throughout. Sensory, motor and coordinative functions appear intact. Skin:     Intact without suspicious lesions or  rashes Psych:     Oriented X3 and depressed affect.      Impression & Recommendations:  Problem # 1:  OPACITY, VITREOUS HUMOR (ICD-379.24) Assessment: New  Orders: Ophthalmology Referral (Ophthalmology)   Problem # 2:  DIABETES MELLITUS, TYPE II (ICD-250.00) Assessment: Improved  Her updated medication list for this problem includes:    Lisinopril 40 Mg Tabs (Lisinopril) ..... Once daily    Metformin Hcl 1000 Mg Tabs (Metformin hcl) .Marland Kitchen... Take 1 tablet by mouth two times a day    Glimepiride 1 Mg Tabs (Glimepiride) .Marland Kitchen... 1 po qd  Labs Reviewed: HgBA1c: 6.4 (01/08/2009)   Creat: 1.0 (01/08/2009)      Problem # 3:  GERD (ICD-530.81) Assessment: Unchanged  Her updated medication list for this problem includes:    Prilosec 20 Mg Cpdr (Omeprazole) ..... Once daily   Problem # 4:  LOW BACK PAIN (ICD-724.2) Assessment: Comment Only  Her updated medication list for this problem includes:    Tizanidine Hcl 4 Mg Tabs (Tizanidine hcl) .Marland Kitchen... 1-2 once daily    Celebrex 200 Mg Caps (Celecoxib) .Marland Kitchen..Marland Kitchen Two times a day    Oxycontin 20 Mg Tb12 (Oxycodone hcl) .Marland Kitchen... 1 tab by mouth two times a day please,  fill on or after 03/29/2009    Oxycodone Hcl 15 Mg Tabs (Oxycodone hcl) .Marland Kitchen... 1 by mouth qid as needed pain. please,  fill on or after 03/29/2009    Cyclobenzaprine Hcl 10 Mg Tabs (Cyclobenzaprine hcl) .Marland Kitchen... Three times a day as needed for back pain   Problem # 5:  KNEE PAIN (ICD-719.46) Assessment: Improved  Her updated medication list for this problem includes:    Tizanidine Hcl 4 Mg Tabs (Tizanidine hcl) .Marland Kitchen... 1-2 once daily    Celebrex 200 Mg Caps (Celecoxib) .Marland Kitchen..Marland Kitchen Two times a day    Oxycontin 20 Mg Tb12 (Oxycodone hcl) .Marland Kitchen... 1 tab by mouth two times a day please,  fill on or after 03/29/2009    Oxycodone Hcl 15 Mg Tabs (Oxycodone hcl) .Marland Kitchen... 1 by mouth qid as needed pain. please,  fill on or after 03/29/2009    Cyclobenzaprine Hcl 10 Mg Tabs (Cyclobenzaprine hcl) .Marland Kitchen... Three  times a day as needed for back pain   Complete Medication List: 1)  Lisinopril 40 Mg Tabs (Lisinopril) .... Once daily 2)  Tizanidine Hcl 4  Mg Tabs (Tizanidine hcl) .Marland Kitchen.. 1-2 once daily 3)  Prilosec 20 Mg Cpdr (Omeprazole) .... Once daily 4)  Celebrex 200 Mg Caps (Celecoxib) .... Two times a day 5)  Lasix 40 Mg Tabs (Furosemide) .Marland Kitchen.. 1 qd as needed 6)  Klor-con M10 10 Meq Tbcr (Potassium chloride crys cr) .... Once daily as needed 7)  Oxycontin 20 Mg Tb12 (Oxycodone hcl) .Marland Kitchen.. 1 tab by mouth two times a day please,  fill on or after 03/29/2009 8)  Oxycodone Hcl 15 Mg Tabs (Oxycodone hcl) .Marland Kitchen.. 1 by mouth qid as needed pain. please,  fill on or after 03/29/2009 9)  Metformin Hcl 1000 Mg Tabs (Metformin hcl) .... Take 1 tablet by mouth two times a day 10)  Wellbutrin Sr 150 Mg Tb12 (Bupropion hcl) .Marland Kitchen.. 1 po bid 11)  Cyclobenzaprine Hcl 10 Mg Tabs (Cyclobenzaprine hcl) .... Three times a day as needed for back pain 12)  Vitamin D3 1000 Unit Tabs (Cholecalciferol) .... Once daily 13)  Detrol La 4 Mg Cp24 (Tolterodine tartrate) .Marland Kitchen.. 1 tablet by mouth daily 14)  Glimepiride 1 Mg Tabs (Glimepiride) .Marland Kitchen.. 1 po qd 15)  Onetouch Ultra Test Strp (Glucose blood) .... Use once daily to two times a day 16)  Onetouch Ultrasoft Lancets Misc (Lancets) .... Use once daily to two times a day 17)  Lorazepam 2 Mg Tabs (Lorazepam) .... 1/2 or 1 by mouth two times a day prn   Patient Instructions: 1)  Please schedule a follow-up appointment in 3 months. 2)  BMP prior to visit, ICD-9: 3)  HbgA1C prior to visit, ICD-9: 250.00   Prescriptions: OXYCODONE HCL 15 MG  TABS (OXYCODONE HCL) 1 by mouth qid as needed pain. Please,  fill on or after 03/29/2009  #120 x 0   Entered and Authorized by:   Cassandria Anger MD   Signed by:   Cassandria Anger MD on 01/15/2009   Method used:   Print then Give to Patient   RxID:   [id number] OXYCONTIN 20 MG  TB12 (OXYCODONE HCL) 1 tab by mouth two times a day Please,   fill on or after 03/29/2009  #60 x 0   Entered and Authorized by:   Cassandria Anger MD   Signed by:   Cassandria Anger MD on 01/15/2009   Method used:   Print then Give to Patient   RxID:   [id number] OXYCODONE HCL 15 MG  TABS (OXYCODONE HCL) 1 by mouth qid as needed pain. Please,  fill on or after 02/26/2009  #120 x 0   Entered and Authorized by:   Cassandria Anger MD   Signed by:   Cassandria Anger MD on 01/15/2009   Method used:   Print then Give to Patient   RxID:   [id number] OXYCONTIN 20 MG  TB12 (OXYCODONE HCL) 1 tab by mouth two times a day Please,  fill on or after 02/26/2009  #60 x 0   Entered and Authorized by:   Cassandria Anger MD   Signed by:   Cassandria Anger MD on 01/15/2009   Method used:   Print then Give to Patient   RxID:   [id number] OXYCODONE HCL 15 MG  TABS (OXYCODONE HCL) 1 by mouth qid as needed pain. Please,  fill on or after 01/29/2009  #120 x 0   Entered and Authorized by:   Cassandria Anger MD   Signed by:   Cassandria Anger MD on 01/15/2009   Method used:  Print then Give to Patient   RxID:   [id number] OXYCONTIN 20 MG  TB12 (OXYCODONE HCL) 1 tab by mouth two times a day Please,  fill on or after 01/29/2009  #60 x 0   Entered and Authorized by:   Cassandria Anger MD   Signed by:   Cassandria Anger MD on 01/15/2009   Method used:   Print then Give to Patient   RxID:   [id number]

## 2011-01-12 NOTE — Procedures (Signed)
Summary: EGD   EGD  Procedure date:  08/30/2002  Findings:      Location: [facility]  GERD Patient Name: [name], [name] MRN: [id number] Procedure Procedures: Panendoscopy (EGD) CPT: A5739879.  Personnel: Endoscopist: Docia Chuck. Henrene Pastor, MD.  Referred By: [place] Plotnikov, MD.  Exam Location: Exam performed in Endoscopy Suite.  Patient Consent: Procedure, Alternatives, Risks and Benefits discussed, consent obtained,  Indications  Evaluation of: Anemia,   Symptoms: Reflux symptoms  History  Pre-Exam Physical: Performed Aug 30, 2002  Entire physical exam was normal.  Exam Exam Info: Maximum depth of insertion Duodenum, intended Duodenum. Patient position: on left side. Vocal cords visualized. Gastric retroflexion performed. Images taken. ASA Classification: II. Tolerance: excellent.  Sedation Meds: Residual sedation present from prior procedure today. Versed 4 mg. given IV.  Monitoring: BP and pulse monitoring done. Oximetry used. Supplemental O2 given  Fluoroscopy: Fluoroscopy was not used.  Findings HIATAL HERNIA:   Assessment Normal examination.  Diagnoses: 530.81: GERD.   Events  Unplanned Intervention: No unplanned interventions were required.  Unplanned Events: There were no complications. Plans Medication(s): Continue current medications.  Disposition: After procedure patient sent to recovery. After recovery patient sent home.  Scheduling: Follow-up prn.    This report was created from the original endoscopy report, which was reviewed and signed by the above listed endoscopist.   cc:  Altamese [place]. Plotnikov, MD

## 2011-01-12 NOTE — Miscellaneous (Signed)
Summary: buropion  Clinical Lists Changes  Medications: Rx of WELLBUTRIN SR 150 MG TB12 (BUPROPION HCL) 1 po bid;  #180 x 3;  Signed;  Entered by: Doralee Albino;  Authorized by: Cassandria Anger MD;  Method used: Electronically to [facility] [place], [place], [place]  [address], Ph: [phone], Fax: [phone]    Prescriptions: WELLBUTRIN SR 150 MG TB12 (BUPROPION HCL) 1 po bid  #180 x 3   Entered by:   Doralee Albino   Authorized by:   Cassandria Anger MD   Signed by:   Doralee Albino on 03/24/2009   Method used:   Electronically to        [facility] (retail)       [address]       [place]       [place], [place]  [address]       Ph: [phone]       Fax: [phone]   [id number]

## 2011-01-12 NOTE — Progress Notes (Signed)
Summary: one touch stips & lancets  Phone Note From Pharmacy   Caller: [facility] Reason for Call: Needs renewal Summary of Call: Requesting renewal on one touch ultra strips & lancets. Initial call taken by: Tomma Lightning,  July 24, 2008 1:35 PM  Follow-up for Phone Call        Sent renewal into pharm. Follow-up by: Tomma Lightning,  July 24, 2008 1:36 PM      Prescriptions: Glory Rosebush ULTRASOFT LANCETS   MISC (LANCETS) use once daily to two times a day  #50 x 6   Entered by:   Tomma Lightning   Authorized by:   Cassandria Anger MD   Signed by:   Tomma Lightning on 07/24/2008   Method used:   Electronically sent to ...       [facility], [place]  [address]       Ph: [phone]       Fax: [phone]   RxID:   [id number] Donald Siva TEST   STRP (GLUCOSE BLOOD) use once daily to two times a day  #50 x 6   Entered by:   Tomma Lightning   Authorized by:   Cassandria Anger MD   Signed by:   Tomma Lightning on 07/24/2008   Method used:   Electronically sent to ...       [facility]       [place], [place]  [address]       Ph: [phone]       Fax: [phone]   RxID:   [id number]

## 2011-01-12 NOTE — Progress Notes (Signed)
Summary: Lorazepam  Phone Note Call from Patient   Summary of Call: Patient is requesting rx for lorazepam 2 mg at bedtime to go to [place]. Pt says that med was increased b/c she is no longer taking ambien. Initial call taken by: Charlsie Quest,  August 26, 2008 9:51 AM  Follow-up for Phone Call        OK Follow-up by: Cassandria Anger MD,  August 26, 2008 5:57 PM    New/Updated Medications: LORAZEPAM 2 MG TABS (LORAZEPAM) 1/2 or 1 by mouth two times a day prn   Prescriptions: LORAZEPAM 2 MG TABS (LORAZEPAM) 1/2 or 1 by mouth two times a day prn  #180 x 1   Entered by:   Charlsie Quest   Authorized by:   Cassandria Anger MD   Signed by:   Charlsie Quest on 08/27/2008   Method used:   Telephoned to ...       [facility] (retail)       [address]       [place], [place]  [address]       Ph: [phone]       Fax: [phone]   RxID:   [id number]

## 2011-01-12 NOTE — Miscellaneous (Signed)
Summary: deltrol la  Clinical Lists Changes  Medications: Rx of DETROL LA 4 MG CP24 (TOLTERODINE TARTRATE) 1 tablet by mouth daily;  #90 x 3;  Signed;  Entered by: Doralee Albino;  Authorized by: Cassandria Anger MD;  Method used: Electronically to [facility] [address] [place], [place]  [id number], Ph: [phone], Fax: [phone]    Prescriptions: DETROL LA 4 MG CP24 (TOLTERODINE TARTRATE) 1 tablet by mouth daily  #90 x 3   Entered by:   Doralee Albino   Authorized by:   Cassandria Anger MD   Signed by:   Doralee Albino on 05/27/2009   Method used:   Electronically to        [facility] (retail)       [place], [place]  [id number]       Ph: [phone]       Fax: [phone]   RxID:   [id number]

## 2011-01-12 NOTE — Procedures (Signed)
Summary: Colonoscopy   Colonoscopy  Procedure date:  06/23/2009  Findings:      Location:  [facility].    Procedures Next Due Date:    Colonoscopy: 06/2014 COLONOSCOPY PROCEDURE REPORT  PATIENT:  [name], [name]  MR#:  [id number] BIRTHDATE:   1951/03/08, 19 yrs. old   GENDER:   female  ENDOSCOPIST:   Docia Chuck. Geri Seminole, MD Referred by: Office Self  PROCEDURE DATE:  06/23/2009 PROCEDURE:  Colonoscopy with biopsy ASA CLASS:   Class III INDICATIONS: unexplained diarrhea   MEDICATIONS:    Fentanyl 100 mcg IV, Versed 10 mg IV, Benadryl 50 mg IV  DESCRIPTION OF PROCEDURE:   After the risks benefits and alternatives of the procedure were thoroughly explained, informed consent was obtained.  Digital rectal exam was performed and revealed no abnormalities.   The [id number] endoscope was introduced through the anus and advanced to the cecum, which was identified by both the appendix and ileocecal valve, without limitations. TIME TO CECUM = 5:57MIN The quality of the prep was excellent, using MoviPrep.  The instrument was then withdrawn (TIME = 12:45 MIN) as the colon was fully examined. <<PROCEDUREIMAGES>>          <<OLD IMAGES>>  FINDINGS:  Moderate diverticulosis was found in the sigmoid colon.  The terminal ileum appeared normal.  A normal appearing cecum, ileocecal valve, and appendiceal orifice were identified. The ascending, hepatic flexure, transverse, splenic flexure, descending, sigmoid colon, and rectum appeared unremarkable. No macroscopic colitis. Random colon bx taken.  Retroflexed views in the rectum revealed internal hemorrhoids.    The scope was then withdrawn from the patient and the procedure completed.  COMPLICATIONS:   None  ENDOSCOPIC IMPRESSION:  1) Moderate diverticulosis in the sigmoid colon  2) Normal terminal ileum  3) Normal colon - random bx  4) Internal hemorrhoids RECOMMENDATIONS:  1) await biopsy results  2) Follow up colonoscopy  in 5 years (hx adenomatous polyps)   _______________________________ Docia Chuck. Geri Seminole, MD  CC: Gustavo Lah, MD; The Patient    REPORT OF SURGICAL PATHOLOGY   Case #: [id number] Patient Name: [name], [name]. Office Chart Number:  [id number]   MRN: [id number] Pathologist: Jaquelyn Bitter A. Rodney Cruise, MD DOB/Age  [birth year] (Age: 60)    Gender: F Date Taken:  06/23/2009 Date Received: 06/24/2009   FINAL DIAGNOSIS   ***MICROSCOPIC EXAMINATION AND DIAGNOSIS***   COLON, RANDOM, BIOPSIES: -  COLONIC MUCOSA WITH INCREASED INTRAEPITHELIAL LYMPHOCYTES AND EXPANSION OF LAMINA PROPRIA BY CHRONIC INFLAMMATORY CELLS CONSISTENT WITH LYMPHOCYTIC (MICROSCOPIC) COLITIS.   COMMENT Dr. Claudette Laws has also reviewed this material and concurs.  (EA:jy) 06/25/09   jy Date Reported:  06/25/2009     Jaquelyn Bitter A. Rodney Cruise, MD *** Electronically Signed Out By EAA ***        June 25, 2009 MRN: [id number]    [place], [place]  [address]    Dear Ms. 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 No evidence of chronic inflammatory bowel disease or infection.   Additional information/recommendations:  1. We will prescribe Entocort 9 mg daily as discussed.  2. Plan to see me in the office in about 4 weeks  3. Call me in the interim for questions or problems.          Please call us if you are having persistent problems  or have questions about your condition that have not been fully answered at this time.  Sincerely,  Irene Shipper MD   This letter has been electronically signed by your physician.    Signed by Irene Shipper MD on 06/25/2009 at 5:30 PM  This report was created from the original endoscopy report, which was reviewed and signed by the above listed endoscopist. .

## 2011-01-12 NOTE — Assessment & Plan Note (Signed)
Summary: achey/low grade temp/?uti/cd   Vital Signs:  Patient profile:   60 year old female Height:      62 inches Weight:      201 pounds BMI:     36.90 O2 Sat:      95 % on Room air Temp:     98.6 degrees F oral Pulse rate:   91 / minute Pulse rhythm:   regular Resp:     16 per minute BP sitting:   112 / 60  (left arm) Cuff size:   regular  Vitals Entered By: Jonathon Resides, CMA(AAMA) (August 06, 2010 9:58 AM)  O2 Flow:  Room air CC: Fever, Body aches, knee pain since this AM Is Patient Diabetic? Yes Comments pt states she is not taking Celebrex.  She take her estrogen hormones once daily not every other day.   Primary Care Provider:  [name], MD  CC:  Fever, Body aches, and knee pain since this AM.  History of Present Illness: C/o fever: T 100.7 C/o weakness, knee pain and stiffness and aches since early am No urinary symptoms   Current Medications (verified): 1)  Oxycodone Hcl 15 Mg  Tabs (Oxycodone Hcl) .Marland Kitchen.. 1 By Mouth Qid As Needed Pain. Please,  Fill On or After 09/16/2010 2)  Oxycontin 20 Mg  Tb12 (Oxycodone Hcl) .Marland Kitchen.. 1 Tab By Mouth Two Times A Day Please,  Fill On or After 09/16/2010 3)  Cyclobenzaprine Hcl 10 Mg  Tabs (Cyclobenzaprine Hcl) .... Three Times A Day As Needed For Back Pain 4)  Prilosec 20 Mg  Cpdr (Omeprazole) .... Once Daily 5)  Celebrex 200 Mg  Caps (Celecoxib) .... Two Times A Day 6)  Metformin Hcl 1000 Mg Tabs (Metformin Hcl) .... Take 1 Tablet By Mouth Two Times A Day 7)  Wellbutrin Sr 150 Mg Tb12 (Bupropion Hcl) .Marland Kitchen.. 1 Po Bid 8)  Vitamin D3 1000 Unit  Tabs (Cholecalciferol) .... Once Daily 9)  Glimepiride 1 Mg Tabs (Glimepiride) .Marland Kitchen.. 1 Po Qd 10)  Onetouch Ultra Test   Strp (Glucose Blood) .... Use Once Daily To Two Times A Day 11)  Onetouch Ultrasoft Lancets   Misc (Lancets) .... Use Once Daily To Two Times A Day 12)  Lorazepam 2 Mg Tabs (Lorazepam) .... 1/2 or 1 By Mouth Two Times A Day Prn 13)  Lidoderm 5 % Ptch (Lidocaine) .Marland Kitchen.. 1  To 3 Patches Qday 14)  Fiber 625 Mg Tabs (Calcium Polycarbophil) .... Take 4 Daily 15)  Lomotil 2.5-0.025 Mg Tabs (Diphenoxylate-Atropine) .Marland Kitchen.. 1-2 By Mouth Q 4-6 Hours As Needed 16)  Estradiol 0.5 Mg Tabs (Estradiol) .Marland Kitchen.. 1 Every Other Day 17)  Medroxyprogesterone Acetate 2.5 Mg Tabs (Medroxyprogesterone Acetate) .Marland Kitchen.. 1 Every Other Day 18)  Ibuprofen 600 Mg Tabs (Ibuprofen) .Marland Kitchen.. 1 By Mouth Bid  Pc X 1 Wk Then As Needed For  Pain 19)  Gelnique 10 % Gel (Oxybutynin Chloride) .Marland Kitchen.. 1 On Skin Qd 20)  Oramoist ..Marland Kitchen. 1 As Two Times A Day Dirrected 21)  Coreg 12.5 Mg Tabs (Carvedilol) .Marland Kitchen.. 1 By Mouth Bid  Allergies (verified): 1)  ! 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 quit 1990 Daily Caffeine Use -2  Review of Systems       The patient complains of anorexia and fever.  The patient denies chest pain, dyspnea on exertion, abdominal pain, melena, and hematochezia.         in relevance of the past couple days  Physical Exam  General:  Well developed, well nourished, in no acute distress. Looks tired... Nose:  No deformity, discharge,  or lesions. Mouth:  Not dry Neck:  Supple; no masses or thyromegaly. Lungs:  Clear throughout to auscultation. Heart:  Regular rate and rhythm; no murmurs, rubs,  or bruits. Abdomen:  Soft, nontender and nondistended. No masses, hepatosplenomegaly or hernias noted. Normal bowel sounds. Msk:  Lumbar-sacral  spine is tender to palpation over paraspinal muscles and painfull with the ROM  B knees w/post-op changes Extremities:  No clubbing, cyanosis, edema or deformities noted. Neurologic:  Alert and  oriented x4;  grossly normal neurologically. Skin:  Intact without significant lesions or rashes. Psych:  Alert and cooperative. tearful and slightly anxious.     Impression & Recommendations:  Problem # 1:  ARTHRALGIA (ICD-719.40) Assessment New Due to fever. Check UA  Problem # 2:  FEVER UNSPECIFIED (ICD-780.60) Assessment: New R/o UTI. Poss viral. 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 1 by mouth qid as needed pain. please,  fill on or after 09/16/2010 2)  Oxycontin 20 Mg Tb12 (Oxycodone hcl) .Marland Kitchen.. 1 tab by mouth two times a day please,  fill on or after 09/16/2010 3)  Cyclobenzaprine Hcl 10 Mg Tabs (Cyclobenzaprine hcl) .... Three times a day as needed for back pain 4)  Prilosec 20 Mg Cpdr (Omeprazole) .... Once daily 5)  Celebrex 200 Mg Caps (Celecoxib) .... Two times a day 6)  Metformin Hcl 1000 Mg Tabs (Metformin hcl) .... Take 1 tablet by mouth two times a day 7)  Wellbutrin Sr 150 Mg Tb12 (Bupropion hcl) .Marland Kitchen.. 1 po bid 8)  Vitamin D3 1000 Unit Tabs (Cholecalciferol) .... Once daily 9)  Glimepiride 1 Mg Tabs (Glimepiride) .Marland Kitchen.. 1 po qd 10)  Onetouch Ultra Test Strp (Glucose blood) .... Use once daily to two times a day 11)  Onetouch Ultrasoft Lancets Misc (Lancets) .... Use once daily to two times a day 12)  Lorazepam 2 Mg Tabs (Lorazepam) .... 1/2 or 1 by mouth two times a day prn 13)  Lidoderm 5 % Ptch (Lidocaine) .Marland Kitchen.. 1 to 3 patches  qday 14)  Fiber 625 Mg Tabs (Calcium polycarbophil) .... Take 4 daily 15)  Lomotil 2.5-0.025 Mg Tabs (Diphenoxylate-atropine) .Marland Kitchen.. 1-2 by mouth q 4-6 hours as needed 16)  Estradiol 0.5 Mg Tabs (Estradiol) .Marland Kitchen.. 1 every other day 17)  Medroxyprogesterone Acetate 2.5 Mg Tabs (Medroxyprogesterone acetate) .Marland Kitchen.. 1 every other day 18)  Ibuprofen 600 Mg Tabs (Ibuprofen) .Marland Kitchen.. 1 by mouth bid  pc x 1 wk then as needed for  pain 19)  Gelnique 10 % Gel (Oxybutynin chloride) .Marland Kitchen.. 1 on skin qd 20)  Oramoist  .... 1 as two times a day dirrected 21)  Coreg 12.5 Mg Tabs (Carvedilol) .Marland Kitchen.. 1 by mouth bid 22)  Ciprofloxacin Hcl  500 Mg Tabs (Ciprofloxacin hcl) .Marland Kitchen.. 1 by mouth bid  Patient Instructions: 1)  Call if you are not better in a reasonable amount of time or if worse. Go to ER if feeling really bad!  Prescriptions: CIPROFLOXACIN HCL 500 MG TABS (CIPROFLOXACIN HCL) 1 by mouth bid  #20 x 1   Entered and Authorized by:   Cassandria Anger MD   Signed by:   Cassandria Anger MD on 08/06/2010   Method used:   Electronically to        [facility] (retail)       [address]       [place], [place]  [address]       Ph: [phone]       Fax: [phone]   RxID:   [id number]

## 2011-01-12 NOTE — Progress Notes (Signed)
Summary: DIRECTIONS  Phone Note From Pharmacy   Summary of Call: Rx for oxytrol (per pt) is supposed to be once weekly. Please advise with correct directions and resend rx.  Initial call taken by: Charlsie Quest, CMA,  February 13, 2010 4:45 PM  Follow-up for Phone Call        Sorry - 1q3d Follow-up by: Cassandria Anger MD,  February 13, 2010 5:17 PM  Additional Follow-up for Phone Call Additional follow up Details #1::        Rx done, pt is aware Additional Follow-up by: Charlsie Quest, CMA,  February 13, 2010 5:27 PM    New/Updated Medications: OXYTROL 3.9 MG/24HR PTTW (OXYBUTYNIN) change q 3 d Prescriptions: OXYTROL 3.9 MG/24HR PTTW (OXYBUTYNIN) change q 3 d  #30 x 3   Entered and Authorized by:   Cassandria Anger MD   Signed by:   Charlsie Quest, CMA on 02/13/2010   Method used:   Electronically to        [facility] (retail)       [address]       [place], [place]  [address]       Ph: [phone]       Fax: [phone]   RxID:   [id number]

## 2011-01-12 NOTE — Progress Notes (Signed)
Summary: metformin  Phone Note Refill Request Message from:  Fax from Pharmacy on May 16, 2009 9:40 AM  Refills Requested: Medication #1:  METFORMIN HCL 1000 MG TABS Take 1 tablet by mouth two times a day   Last Refilled: 01/31/2009 [facility] [phone]  Initial call taken by: Tomma Lightning,  May 16, 2009 9:41 AM      Prescriptions: METFORMIN HCL 1000 MG TABS (METFORMIN HCL) Take 1 tablet by mouth two times a day  #180 x 3   Entered by:   Tomma Lightning   Authorized by:   Cassandria Anger MD   Signed by:   Tomma Lightning on 05/16/2009   Method used:   Electronically to        [facility] (retail)       [address]       [place], [place]  [address]       Ph: [phone]       Fax: [phone]   RxID:   [id number]

## 2011-01-12 NOTE — Progress Notes (Signed)
Summary: BP MED  Phone Note Call from Patient Call back at Work Phone [phone]   Summary of Call: Pt was diagnosed with microscopic colitis by Dr Henrene Pastor. She had elevated bun & creat so Dr Henrene Pastor stopped her lisinopril. BUN & creat were repeated off med and labs were better. Dr Henrene Pastor would like Dr Alain Marion to determine if she should take lisinopril or another med.  Initial call taken by: Charlsie Quest,  June 27, 2009 10:35 AM  Follow-up for Phone Call        OK to d/c. Keep return office visit                       Follow-up by: Cassandria Anger MD,  June 27, 2009 1:14 PM  Additional Follow-up for Phone Call Additional follow up Details #1::        Left vm on hm # as requested by pt Additional Follow-up by: Charlsie Quest,  June 30, 2009 10:22 AM

## 2011-01-12 NOTE — Assessment & Plan Note (Signed)
Summary: f/u appt/meds/cd   Vital Signs:  Patient Profile:   60 Years Old Female Weight:      207 pounds Temp:     99.5 degrees F oral Pulse rate:   108 / minute BP sitting:   156 / 88  (left arm)  Vitals Entered By: Doralee Albino (October 16, 2008 4:17 PM)                 Chief Complaint:  Multiple medical problems or concerns.  History of Present Illness: The patient presents for a post-hospital visit for colitis that started in [facility] The patient presents for a follow up of back pain, anxiety, depression and DM with HTN.      Current Allergies (reviewed today): ! ERYTHROMYCIN (ERYTHROMYCIN BASE)  Past Medical History:    Reviewed history from 11/08/2007 and no changes required:       Diabetes mellitus, type II       GERD       Hypertension       IBS       FMS       Anxiety       Depression       Low back pain       Osteoarthritis       Foramen magnum ependymoma 2003   Family History:    Reviewed history from 11/08/2007 and no changes required:       Family History Diabetes 1st degree relative  Social History:    Reviewed history from 11/08/2007 and no changes required:       Occupation: Therapist, sports       Single       Alcohol use-no     Physical Exam  General:     overweight-appearing.   Eyes:     No corneal or conjunctival inflammation noted. EOMI. Perrla. Funduscopic exam benign, without hemorrhages, exudates or papilledema. Vision grossly normal. Nose:     External nasal examination shows no deformity or inflammation. Nasal mucosa are pink and moist without lesions or exudates. Mouth:     Oral mucosa and oropharynx without lesions or exudates.  Teeth in good repair. Neck:     No deformities, masses, or tenderness noted. Lungs:     Normal respiratory effort, chest expands symmetrically. Lungs are clear to auscultation, no crackles or wheezes. Heart:     Normal rate and regular rhythm. S1 and S2 normal without gallop, murmur, click, rub or other  extra sounds. Msk:     Lumbar-sacral spine is tender to palpation over paraspinal muscles and painfull with the ROM, knees were tender  Extremities:     No clubbing, cyanosis, edema, or deformity noted with normal full range of motion of all joints.   Neurologic:     No cranial nerve deficits noted. Station and gait are normal. Plantar reflexes are down-going bilaterally. DTRs are symmetrical throughout. Sensory, motor and coordinative functions appear intact. Skin:     Intact without suspicious lesions or rashes Psych:     Oriented X3 and depressed affect.      Impression & Recommendations:  Problem # 1:  COLITIS (ICD-558.9) resolved Assessment: Comment Only  Problem # 2:  HYPERTENSION (ICD-401.9) Assessment: Deteriorated  Her updated medication list for this problem includes:    Lisinopril 40 Mg Tabs (Lisinopril) ..... Once daily    Lasix 40 Mg Tabs (Furosemide) .Marland Kitchen... 1 qd as needed  BP today: 156/88 Prior BP: 120/76 (07/10/2008)  Labs Reviewed: Creat: 1.0 (07/08/2008) Chol: 164 (  01/10/2008)   HDL: 44.4 (01/10/2008)   LDL: 95 (01/10/2008)   TG: 122 (01/10/2008)   Problem # 3:  DIABETES MELLITUS, TYPE II (ICD-250.00) Assessment: Comment Only  Her updated medication list for this problem includes:    Lisinopril 40 Mg Tabs (Lisinopril) ..... Once daily    Metformin Hcl 1000 Mg Tabs (Metformin hcl) .Marland Kitchen... Take 1 tablet by mouth two times a day    Glimepiride 1 Mg Tabs (Glimepiride) .Marland Kitchen... 1 po qd  Orders: TLB-BMP (Basic Metabolic Panel-BMET) (99991111) TLB-A1C / Hgb A1C (Glycohemoglobin) (83036-A1C)   Problem # 4:  OSTEOARTHRITIS (ICD-715.90) Assessment: Comment Only  Her updated medication list for this problem includes:    Celebrex 200 Mg Caps (Celecoxib) .Marland Kitchen..Marland Kitchen Two times a day    Oxycontin 20 Mg Tb12 (Oxycodone hcl) .Marland Kitchen... 1 tab by mouth two times a day please,  fill on or after 12/17/9    Oxycodone Hcl 15 Mg Tabs (Oxycodone hcl) .Marland Kitchen... 1 by mouth qid as needed  pain. please,  fill on or after  12/17/9   Problem # 5:  LOW BACK PAIN (ICD-724.2) Assessment: Unchanged  Her updated medication list for this problem includes:    Tizanidine Hcl 4 Mg Tabs (Tizanidine hcl) .Marland Kitchen... 1-2 once daily    Celebrex 200 Mg Caps (Celecoxib) .Marland Kitchen..Marland Kitchen Two times a day    Oxycontin 20 Mg Tb12 (Oxycodone hcl) .Marland Kitchen... 1 tab by mouth two times a day please,  fill on or after 12/17/9    Oxycodone Hcl 15 Mg Tabs (Oxycodone hcl) .Marland Kitchen... 1 by mouth qid as needed pain. please,  fill on or after  12/17/9    Cyclobenzaprine Hcl 10 Mg Tabs (Cyclobenzaprine hcl) .Marland Kitchen... Three times a day as needed for back pain   Complete Medication List: 1)  Lisinopril 40 Mg Tabs (Lisinopril) .... Once daily 2)  Tizanidine Hcl 4 Mg Tabs (Tizanidine hcl) .Marland Kitchen.. 1-2 once daily 3)  Prilosec 20 Mg Cpdr (Omeprazole) .... Once daily 4)  Celebrex 200 Mg Caps (Celecoxib) .... Two times a day 5)  Lasix 40 Mg Tabs (Furosemide) .Marland Kitchen.. 1 qd as needed 6)  Klor-con M10 10 Meq Tbcr (Potassium chloride crys cr) .... Once daily as needed 7)  Oxycontin 20 Mg Tb12 (Oxycodone hcl) .Marland Kitchen.. 1 tab by mouth two times a day please,  fill on or after 12/17/9 8)  Oxycodone Hcl 15 Mg Tabs (Oxycodone hcl) .Marland Kitchen.. 1 by mouth qid as needed pain. please,  fill on or after  12/17/9 9)  Metformin Hcl 1000 Mg Tabs (Metformin hcl) .... Take 1 tablet by mouth two times a day 10)  Wellbutrin Sr 150 Mg Tb12 (Bupropion hcl) .Marland Kitchen.. 1 po bid 11)  Cyclobenzaprine Hcl 10 Mg Tabs (Cyclobenzaprine hcl) .... Three times a day as needed for back pain 12)  Vitamin D3 1000 Unit Tabs (Cholecalciferol) .... Once daily 13)  Detrol La 4 Mg Cp24 (Tolterodine tartrate) .Marland Kitchen.. 1 tablet by mouth daily 14)  Glimepiride 1 Mg Tabs (Glimepiride) .Marland Kitchen.. 1 po qd 15)  Onetouch Ultra Test Strp (Glucose blood) .... Use once daily to two times a day 16)  Onetouch Ultrasoft Lancets Misc (Lancets) .... Use once daily to two times a day 17)  Lorazepam 2 Mg Tabs (Lorazepam) .... 1/2 or 1 by  mouth two times a day prn   Patient Instructions: 1)  Please schedule a follow-up appointment in 3 months. 2)  BMP prior to visit, ICD-9: 3)  Hepatic Panel prior to visit, ICD-9: 4)  Lipid Panel prior  to visit, ICD-9: 250.00  995.20 5)  TSH prior to visit, ICD-9: 6)  CBC w/ Diff prior to visit, ICD-9: 7)  HbgA1C prior to visit, ICD-9: 8)  Vit D   Prescriptions: OXYCODONE HCL 15 MG  TABS (OXYCODONE HCL) 1 by mouth qid as needed pain. Please,  fill on or after  12/17/9  #120 x 0   Entered and Authorized by:   Cassandria Anger MD   Signed by:   Cassandria Anger MD on 10/16/2008   Method used:   Print then Give to Patient   RxID:   [id number] OXYCONTIN 20 MG  TB12 (OXYCODONE HCL) 1 tab by mouth two times a day Please,  fill on or after 12/17/9  #60 x 0   Entered and Authorized by:   Cassandria Anger MD   Signed by:   Cassandria Anger MD on 10/16/2008   Method used:   Print then Give to Patient   RxID:   [id number] TIZANIDINE HCL 4 MG  TABS (TIZANIDINE HCL) 1-2 once daily  #180 x 3   Entered and Authorized by:   Cassandria Anger MD   Signed by:   Cassandria Anger MD on 10/16/2008   Method used:   Print then Give to Patient   RxID:   [id number] WELLBUTRIN SR 150 MG TB12 (BUPROPION HCL) 1 po bid  #180 x 3   Entered and Authorized by:   Cassandria Anger MD   Signed by:   Cassandria Anger MD on 10/16/2008   Method used:   Print then Give to Patient   [id number] CELEBREX 200 MG  CAPS (CELECOXIB) two times a day  #180 x 3   Entered and Authorized by:   Cassandria Anger MD   Signed by:   Cassandria Anger MD on 10/16/2008   Method used:   Print then Give to Patient   RxID:   [id number] OXYCONTIN 20 MG  TB12 (OXYCODONE HCL) 1 tab by mouth two times a day Please,  fill on or after 11/17/9  #60 x 0   Entered and Authorized by:   Cassandria Anger MD   Signed by:   Cassandria Anger MD on 10/16/2008   Method used:    Print then Give to Patient   RxID:   [id number] OXYCODONE HCL 15 MG  TABS (OXYCODONE HCL) 1 by mouth qid as needed pain. Please,  fill on or after  9/17/9  #120 x 0   Entered and Authorized by:   Cassandria Anger MD   Signed by:   Cassandria Anger MD on 10/16/2008   Method used:   Print then Give to Patient   RxID:   [id number] OXYCODONE HCL 15 MG  TABS (OXYCODONE HCL) 1 by mouth qid as needed pain. Please,  fill on or after   8/17/9 (going on vacation)  #120 x 0   Entered and Authorized by:   Cassandria Anger MD   Signed by:   Cassandria Anger MD on 10/16/2008   Method used:   Print then Give to Patient   RxID:   [id number] OXYCONTIN 20 MG  TB12 (OXYCODONE HCL) 1 tab by mouth two times a day Please,  fill on or after 9/17/9 (going on vacation)  #60 x 0   Entered and Authorized by:   Cassandria Anger MD   Signed by:   Cassandria Anger MD on 10/16/2008  Method used:   Print then Give to Patient   RxID:   [id number]  ]

## 2011-01-12 NOTE — Progress Notes (Signed)
Summary: A1C  Phone Note Call from Patient Call back at [facility] Phone [phone]   Summary of Call: Patient is requesting to be called with A1C #, ok to leave a vm Initial call taken by: Charlsie Quest,  January 18, 2008 8:12 AM  Follow-up for Phone Call        Lf mess on pt's vm Follow-up by: Charlsie Quest,  January 18, 2008 1:41 PM

## 2011-01-12 NOTE — Progress Notes (Signed)
Summary: rx change request  Phone Note Other Incoming Call back at [facility] Phone [phone] Call back at Work Phone [phone]   Summary of Call: Patient stopped by the office stating that Oxytrol patches work well, and she does not have dry mouth, but they do not stay on very well. Per the patient is falls off in the shower and etc. Patient would like to try a by mouth version if it does not cost too much. Please send to [facility] outpatient pharmacy. Please advise. Initial call taken by: Ernestene Mention,  March 19, 2010 4:14 PM  Follow-up for Phone Call        Try generic Detrol Follow-up by: Cassandria Anger MD,  March 20, 2010 7:38 AM  Additional Follow-up for Phone Call Additional follow up Details #1::        Pt informed, but she is req rx for oxybutin in pill form b/c there was no/less dry mouth side effects. OK?  Additional Follow-up by: Charlsie Quest, CMA,  March 20, 2010 4:14 PM    Additional Follow-up for Phone Call Additional follow up Details #2::    ok Follow-up by: Cassandria Anger MD,  March 20, 2010 5:32 PM  New/Updated Medications: DETROL LA 4 MG XR24H-CAP (TOLTERODINE TARTRATE) 1 by mouth qd OXYBUTYNIN CHLORIDE 5 MG TABS (OXYBUTYNIN CHLORIDE) 1 by mouth two times a day for bladder Prescriptions: OXYBUTYNIN CHLORIDE 5 MG TABS (OXYBUTYNIN CHLORIDE) 1 by mouth two times a day for bladder  #180 x 3   Entered and Authorized by:   Cassandria Anger MD   Signed by:   Charlsie Quest, CMA on 03/20/2010   Method used:   Electronically to        [facility] (retail)       [address]       [place], [place]  [address]       Ph: [phone]       Fax: [phone]   RxID:   [id number] DETROL LA 4 MG XR24H-CAP (TOLTERODINE TARTRATE) 1 by mouth qd  #90 x 3   Entered and Authorized by:   Cassandria Anger MD   Signed by:   Charlsie Quest, CMA on 03/20/2010   Method used:   Electronically to        [facility] (retail)       [address]       [place],   [address]       Ph: [phone]       Fax: [phone]   RxID:   [id number]

## 2011-01-12 NOTE — Assessment & Plan Note (Signed)
Summary: 3 MO ROV /NWS  $50   Vital Signs:  Patient Profile:   60 Years Old Female Weight:      223 pounds Temp:     97.8 degrees F oral Pulse rate:   96 / minute BP sitting:   120 / 76  (right arm)  Vitals Entered By: Doralee Albino (July 10, 2008 11:06 AM)                 Chief Complaint:  Multiple medical problems or concerns.  History of Present Illness: The patient presents for a follow up of back pain, anxiety, depression and headaches.     Current Allergies (reviewed today): ! ERYTHROMYCIN (ERYTHROMYCIN BASE)  Past Medical History:    Reviewed history from 11/08/2007 and no changes required:       Diabetes mellitus, type II       GERD       Hypertension       IBS       FMS       Anxiety       Depression       Low back pain       Osteoarthritis       Foramen magnum ependymoma 2003  Past Surgical History:    Reviewed history from 04/10/2008 and no changes required:       Foramen magnum ependymoma  surgery 2003 Dr Rita Ohara       Total knee replacement L 2008, R 2009  Dr Maureen Ralphs   Family History:    Reviewed history from 11/08/2007 and no changes required:       Family History Diabetes 1st degree relative  Social History:    Reviewed history from 11/08/2007 and no changes required:       Occupation: Therapist, sports       Single       Alcohol use-no    Review of Systems  The patient denies fever, chest pain, syncope, peripheral edema, prolonged cough, and abdominal pain.     Physical Exam  General:     overweight-appearing.   Head:     Normocephalic and atraumatic without obvious abnormalities. No apparent alopecia or balding. Eyes:     No corneal or conjunctival inflammation noted. EOMI. Perrla. Funduscopic exam benign, without hemorrhages, exudates or papilledema. Vision grossly normal. Ears:     External ear exam shows no significant lesions or deformities.  Otoscopic examination reveals clear canals, tympanic membranes are intact bilaterally without  bulging, retraction, inflammation or discharge. Hearing is grossly normal bilaterally. Nose:     External nasal examination shows no deformity or inflammation. Nasal mucosa are pink and moist without lesions or exudates. Mouth:     Oral mucosa and oropharynx without lesions or exudates.  Teeth in good repair. Neck:     No deformities, masses, or tenderness noted. Lungs:     Normal respiratory effort, chest expands symmetrically. Lungs are clear to auscultation, no crackles or wheezes. Heart:     Normal rate and regular rhythm. S1 and S2 normal without gallop, murmur, click, rub or other extra sounds. Abdomen:     Bowel sounds positive,abdomen soft and non-tender without masses, organomegaly or hernias noted. Msk:     Lumbar-sacral spine is tender to palpation over paraspinal muscles and painfull with the ROM  Pulses:     R and L carotid,radial,femoral,dorsalis pedis and posterior tibial pulses are full and equal bilaterally Extremities:     No clubbing, cyanosis, edema, or deformity noted with normal  full range of motion of all joints.   Neurologic:     No cranial nerve deficits noted. Station and gait are normal. Plantar reflexes are down-going bilaterally. DTRs are symmetrical throughout. Sensory, motor and coordinative functions appear intact. Skin:     Intact without suspicious lesions or rashes Psych:     Oriented X3 and depressed affect.      Impression & Recommendations:  Problem # 1:  DIABETES MELLITUS, TYPE II (ICD-250.00) Assessment: Deteriorated  Her updated medication list for this problem includes:    Lisinopril 40 Mg Tabs (Lisinopril) ..... Once daily    Metformin Hcl 1000 Mg Tabs (Metformin hcl) .Marland Kitchen... Take 1 tablet by mouth two times a day    Glimepiride 1 Mg Tabs (Glimepiride) .Marland Kitchen... 1 po once daily  added  Labs Reviewed: HgBA1c: 8.0 (07/08/2008)   Creat: 1.0 (07/08/2008)     Her updated medication list for this problem includes:    Lisinopril 40 Mg Tabs  (Lisinopril) ..... Once daily    Metformin Hcl 1000 Mg Tabs (Metformin hcl) .Marland Kitchen... Take 1 tablet by mouth two times a day    Glimepiride 1 Mg Tabs (Glimepiride) .Marland Kitchen... 1 po qd   Problem # 2:  HYPERTENSION (ICD-401.9) Assessment: Unchanged  Her updated medication list for this problem includes:    Lisinopril 40 Mg Tabs (Lisinopril) ..... Once daily    Lasix 40 Mg Tabs (Furosemide) .Marland Kitchen... 1 qd as needed  BP today: 120/76 Prior BP: 136/79 (04/10/2008)  Labs Reviewed: Creat: 1.0 (07/08/2008) Chol: 164 (01/10/2008)   HDL: 44.4 (01/10/2008)   LDL: 95 (01/10/2008)   TG: 122 (01/10/2008)   Problem # 3:  LOW BACK PAIN (ICD-724.2) Assessment: Unchanged  Her updated medication list for this problem includes:    Tizanidine Hcl 4 Mg Tabs (Tizanidine hcl) .Marland Kitchen... 1-2 once daily    Celebrex 200 Mg Caps (Celecoxib) .Marland Kitchen..Marland Kitchen Two times a day    Oxycontin 20 Mg Tb12 (Oxycodone hcl) .Marland Kitchen... 1 tab by mouth two times a day please,  fill on or after 9/17/9 (going on vacation)    Cyclobenzaprine Hcl 10 Mg Tabs (Cyclobenzaprine hcl) .Marland Kitchen... Three times a day as needed for back pain    Oxycodone Hcl 15 Mg Tabs (Oxycodone hcl) .Marland Kitchen... 1 by mouth qid as needed pain. please,  fill on or after   8/17/9 (going on vacation)   Problem # 4:  KNEE PAIN (ICD-719.46) Assessment: Improved  Her updated medication list for this problem includes:    Tizanidine Hcl 4 Mg Tabs (Tizanidine hcl) .Marland Kitchen... 1-2 once daily    Celebrex 200 Mg Caps (Celecoxib) .Marland Kitchen..Marland Kitchen Two times a day    Oxycontin 20 Mg Tb12 (Oxycodone hcl) .Marland Kitchen... 1 tab by mouth two times a day please,  fill on or after 9/17/9 (going on vacation)    Cyclobenzaprine Hcl 10 Mg Tabs (Cyclobenzaprine hcl) .Marland Kitchen... Three times a day as needed for back pain    Oxycodone Hcl 15 Mg Tabs (Oxycodone hcl) .Marland Kitchen... 1 by mouth qid as needed pain. please,  fill on or after   8/17/9 (going on vacation)   Problem # 5:  GERD (ICD-530.81) Assessment: Unchanged  Her updated medication list for this  problem includes:    Prilosec 20 Mg Cpdr (Omeprazole) ..... Once daily   Complete Medication List: 1)  Lisinopril 40 Mg Tabs (Lisinopril) .... Once daily 2)  Tizanidine Hcl 4 Mg Tabs (Tizanidine hcl) .Marland Kitchen.. 1-2 once daily 3)  Lorazepam 1 Mg Tabs (Lorazepam) .... Two times a day  4)  Prilosec 20 Mg Cpdr (Omeprazole) .... Once daily 5)  Celebrex 200 Mg Caps (Celecoxib) .... Two times a day 6)  Lasix 40 Mg Tabs (Furosemide) .Marland Kitchen.. 1 qd as needed 7)  Klor-con M10 10 Meq Tbcr (Potassium chloride crys cr) .... Once daily as needed 8)  Oxycontin 20 Mg Tb12 (Oxycodone hcl) .Marland Kitchen.. 1 tab by mouth two times a day please,  fill on or after 9/17/9 (going on vacation) 9)  Zolpidem Tartrate 10 Mg Tabs (Zolpidem tartrate) .... 1/2 or 1 by mouth at hs prn 10)  Metformin Hcl 1000 Mg Tabs (Metformin hcl) .... Take 1 tablet by mouth two times a day 11)  Wellbutrin Sr 150 Mg Tb12 (Bupropion hcl) .Marland Kitchen.. 1 po bid 12)  Cyclobenzaprine Hcl 10 Mg Tabs (Cyclobenzaprine hcl) .... Three times a day as needed for back pain 13)  Vitamin D3 1000 Unit Tabs (Cholecalciferol) .... Once daily 14)  Oxycodone Hcl 15 Mg Tabs (Oxycodone hcl) .Marland Kitchen.. 1 by mouth qid as needed pain. please,  fill on or after   8/17/9 (going on vacation) 15)  Detrol La 4 Mg Cp24 (Tolterodine tartrate) .Marland Kitchen.. 1 tablet by mouth daily 16)  Glimepiride 1 Mg Tabs (Glimepiride) .Marland Kitchen.. 1 po qd   Patient Instructions: 1)  Please schedule a follow-up appointment in 2 months.   Prescriptions: OXYCODONE HCL 15 MG  TABS (OXYCODONE HCL) 1 by mouth qid as needed pain. Please,  fill on or after   8/17/9 (going on vacation)  #120 x 0   Entered and Authorized by:   Cassandria Anger MD   Signed by:   Cassandria Anger MD on 07/10/2008   Method used:   Print then Give to Patient   RxID:   [id number] OXYCODONE HCL 15 MG  TABS (OXYCODONE HCL) 1 by mouth qid as needed pain. Please,  fill on or after   8/22/9  #120 x 0   Entered and Authorized by:   Cassandria Anger  MD   Signed by:   Cassandria Anger MD on 07/10/2008   Method used:   Print then Give to Patient   RxID:   [id number] OXYCODONE HCL 15 MG  TABS (OXYCODONE HCL) 1 by mouth qid as needed pain. Please,  fill on or after   7/22/9  #120 x 0   Entered and Authorized by:   Cassandria Anger MD   Signed by:   Cassandria Anger MD on 07/10/2008   Method used:   Print then Give to Patient   RxID:   [id number] OXYCONTIN 20 MG  TB12 (OXYCODONE HCL) 1 tab by mouth two times a day Please,  fill on or after 9/17/9 (going on vacation)  #60 x 0   Entered and Authorized by:   Cassandria Anger MD   Signed by:   Cassandria Anger MD on 07/10/2008   Method used:   Print then Give to Patient   RxID:   [id number] OXYCONTIN 20 MG  TB12 (OXYCODONE HCL) 1 tab by mouth two times a day Please,  fill on or after 8/22/9  #60 x 0   Entered and Authorized by:   Cassandria Anger MD   Signed by:   Cassandria Anger MD on 07/10/2008   Method used:   Print then Give to Patient   RxID:   [id number] OXYCONTIN 20 MG  TB12 (OXYCODONE HCL) 1 tab by mouth two times a day Please,  fill on or after 7/22/9  #  60 x 0   Entered and Authorized by:   Cassandria Anger MD   Signed by:   Cassandria Anger MD on 07/10/2008   Method used:   Print then Give to Patient   RxID:   [id number] CYCLOBENZAPRINE HCL 10 MG  TABS (CYCLOBENZAPRINE HCL) three times a day as needed for back pain  #270 x 1   Entered and Authorized by:   Cassandria Anger MD   Signed by:   Cassandria Anger MD on 07/10/2008   Method used:   Print then Give to Patient   RxID:   [id number] GLIMEPIRIDE 1 MG TABS (GLIMEPIRIDE) 1 po qd  #30 x 12   Entered and Authorized by:   Cassandria Anger MD   Signed by:   Cassandria Anger MD on 07/10/2008   Method used:   Print then Give to Patient   [id number]  ]

## 2011-01-12 NOTE — Progress Notes (Signed)
Summary: RFs  Phone Note Refill Request   Refills Requested: Medication #1:  OXYCONTIN 20 MG  TB12 1 tab by mouth two times a day Please  Medication #2:  OXYCODONE HCL 15 MG  TABS 1 by mouth qid as needed pain. Please Initial call taken by: Charlsie Quest,  June 26, 2009 9:03 AM  Follow-up for Phone Call        OK to ref Follow-up by: Cassandria Anger MD,  June 26, 2009 12:43 PM  Additional Follow-up for Phone Call Additional follow up Details #1::        Pt aware to pick up rx Additional Follow-up by: Charlsie Quest,  June 27, 2009 2:50 PM    New/Updated Medications: OXYCONTIN 20 MG  TB12 (OXYCODONE HCL) 1 tab by mouth two times a day Please,  fill on or after 06/28/2009 OXYCODONE HCL 15 MG  TABS (OXYCODONE HCL) 1 by mouth qid as needed pain. Please,  fill on or after 06/28/2009 Prescriptions: OXYCODONE HCL 15 MG  TABS (OXYCODONE HCL) 1 by mouth qid as needed pain. Please,  fill on or after 06/28/2009  #120 x 0   Entered by:   Charlsie Quest   Authorized by:   Cassandria Anger MD   Signed by:   Charlsie Quest on 06/26/2009   Method used:   Print then Give to Patient   RxID:   [id number] OXYCONTIN 20 MG  TB12 (OXYCODONE HCL) 1 tab by mouth two times a day Please,  fill on or after 06/28/2009  #60 x 0   Entered by:   Charlsie Quest   Authorized by:   Cassandria Anger MD   Signed by:   Charlsie Quest on 06/26/2009   Method used:   Print then Give to Patient   RxID:   [id number]

## 2011-01-12 NOTE — Letter (Signed)
Summary: Patient Notice- Colon Biospy Results  [facility]  [address] 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 No evidence of chronic inflammatory bowel disease or infection.   Additional information/recommendations:  1. We will prescribe Entocort 9 mg daily as discussed.  2. Plan to see me in the office in about 4 weeks  3. Call me in the interim for questions or problems.          Please call us if you are having persistent problems or have questions about your condition that have not been fully answered at this time.  Sincerely,  Irene Shipper MD   This letter has been electronically signed by your physician.

## 2011-01-12 NOTE — Progress Notes (Signed)
Summary: cyclobenzaprine  Phone Note Other Incoming Call back at fax   Call placed by: [facility] Details for Reason: request refill on cyclobenzaprine Details of Action Taken: ok x 1 refill/vg Initial call taken by: Doralee Albino,  January 31, 2009 12:24 PM      Prescriptions: CYCLOBENZAPRINE HCL 10 MG  TABS (CYCLOBENZAPRINE HCL) three times a day as needed for back pain  #270 x 1   Entered by:   Doralee Albino   Authorized by:   Cassandria Anger MD   Signed by:   Doralee Albino on 01/31/2009   Method used:   Electronically to        [facility] (retail)       [address]       [place], [place]  [address]       Ph: [phone]       Fax: [phone]   RxID:   [id number]

## 2011-01-12 NOTE — Miscellaneous (Signed)
Summary: Orders w ROI/Med-Link  Orders w ROI/Med-Link   Imported By: Bubba Hales 07/03/2010 08:01:19  _____________________________________________________________________  External Attachment:    Type:   Image     Comment:   External Document

## 2011-01-12 NOTE — Assessment & Plan Note (Signed)
Summary: 2 MTH FU-STC   Vital Signs:  Patient Profile:   60 Years Old Female Weight:      243 pounds Temp:     99.2 degrees F Pulse rate:   92 / minute BP sitting:   158 / 96  (left arm)  Vitals Entered By: Doralee Albino (November 08, 2007 10:17 AM)             Is Patient Diabetic? Yes  CBG Result 174     Chief Complaint:  Multiple medical problems or concerns.  History of Present Illness: The patient presents for a follow up of back pain, anxiety, depression and headaches x 2 d, DM.   Current Allergies: ! ERYTHROMYCIN (ERYTHROMYCIN BASE)  Past Medical History:    Diabetes mellitus, type II    GERD    Hypertension    IBS    FMS    Anxiety    Depression    Low back pain    Osteoarthritis    Foramen magnum ependymoma 2003  Past Surgical History:    Foramen magnum ependymoma  surgery 2003 Dr Rita Ohara    Total knee replacement L 2008 Dr Maureen Ralphs   Family History:    Family History Diabetes 1st degree relative  Social History:    Occupation: Therapist, sports    Single    Alcohol use-no   Risk Factors:  Alcohol use:  no    Physical Exam  General:     Well-developed,well-nourished,in no acute distress; alert,appropriate and cooperative throughout examination Head:     Normocephalic and atraumatic without obvious abnormalities. No apparent alopecia or balding. Eyes:     No corneal or conjunctival inflammation noted. EOMI. Perrla. Funduscopic exam benign, without hemorrhages, exudates or papilledema. Vision grossly normal. Nose:     External nasal examination shows no deformity or inflammation. Nasal mucosa are pink and moist without lesions or exudates. Mouth:     Oral mucosa and oropharynx without lesions or exudates.  Teeth in good repair. Neck:     No deformities, masses, or tenderness noted. Lungs:     Normal respiratory effort, chest expands symmetrically. Lungs are clear to auscultation, no crackles or wheezes. Heart:     Normal rate and regular rhythm. S1  and S2 normal without gallop, murmur, click, rub or other extra sounds. Abdomen:     Bowel sounds positive,abdomen soft and non-tender without masses, organomegaly or hernias noted. Genitalia:     Normal introitus for age, no external lesions, no vaginal discharge, mucosa pink and moist, no vaginal or cervical lesions, no vaginal atrophy, no friaility or hemorrhage, normal uterus size and position, no adnexal masses or tenderness Msk:     No deformity or scoliosis noted of thoracic or lumbar spine.  Lumbar-sacral spine is tender to palpation over paraspinal muscles and painfull with the ROM L knee is a little swollen post-op Neurologic:     No cranial nerve deficits noted. Station and gait are normal. Plantar reflexes are down-going bilaterally. DTRs are symmetrical throughout. Sensory, motor and coordinative functions appear intact. Skin:     Intact without suspicious lesions or rashes Psych:     depressed affect.      Impression & Recommendations:  Problem # 1:  LOW BACK PAIN (ICD-724.2) Assessment: Unchanged  The following medications were removed from the medication list:    Advil 200 Mg Tabs (Ibuprofen) .Marland Kitchen... Three times a day    Tizanidine Hcl 4 Mg Tabs (Tizanidine hcl) ..... 20mg  two times a day at bedtime  Her updated medication list for this problem includes:    Tizanidine Hcl 4 Mg Tabs (Tizanidine hcl) .Marland Kitchen... 1-2 once daily    Celebrex 200 Mg Caps (Celecoxib) .Marland Kitchen..Marland Kitchen Two times a day    Oxycontin 20 Mg Tb12 (Oxycodone hcl) .Marland Kitchen... 1 tab by mouth two times a day please,  fill on or after 1/7/9    Oxycodone Hcl 30 Mg Tabs (Oxycodone hcl) .Marland Kitchen... 1 tab by mouth qid as needed pain please,  fill on or after 1/7/9   Problem # 2:  DEPRESSION (ICD-311) Assessment: Unchanged  The following medications were removed from the medication list:    Effexor Xr 150 Mg Cp24 (Venlafaxine hcl) .Marland Kitchen..Marland Kitchen Two times a day    Ativan 1 Mg Tabs (Lorazepam) .Marland Kitchen..Marland Kitchen Two times a day as needed  Her updated  medication list for this problem includes:    Lorazepam 1 Mg Tabs (Lorazepam) .Marland Kitchen..Marland Kitchen Two times a day    Wellbutrin Sr 150 Mg Tb12 (Bupropion hcl) .Marland Kitchen... 1 po bid   Problem # 3:  OSTEOARTHRITIS (ICD-715.90) Assessment: Improved  The following medications were removed from the medication list:    Advil 200 Mg Tabs (Ibuprofen) .Marland Kitchen... Three times a day  Her updated medication list for this problem includes:    Celebrex 200 Mg Caps (Celecoxib) .Marland Kitchen..Marland Kitchen Two times a day    Oxycontin 20 Mg Tb12 (Oxycodone hcl) .Marland Kitchen... 1 tab by mouth two times a day please,  fill on or after 1/7/9    Oxycodone Hcl 30 Mg Tabs (Oxycodone hcl) .Marland Kitchen... 1 tab by mouth qid as needed pain please,  fill on or after 1/7/9   Problem # 4:  HYPERTENSION (ICD-401.9) Assessment: Unchanged  Her updated medication list for this problem includes:    Lisinopril 40 Mg Tabs (Lisinopril) ..... Once daily    Lasix 40 Mg Tabs (Furosemide) .Marland Kitchen... As needed   Problem # 5:  DIABETES MELLITUS, TYPE II (ICD-250.00) Assessment: Improved  The following medications were removed from the medication list:    Metformin Hcl 500 Mg Tb24 (Metformin hcl) .Marland Kitchen..Marland Kitchen Two times a day  Her updated medication list for this problem includes:    Lisinopril 40 Mg Tabs (Lisinopril) ..... Once daily    Metformin Hcl 1000 Mg Tabs (Metformin hcl) .Marland Kitchen... Take 1 tablet by mouth two times a day   Complete Medication List: 1)  Lisinopril 40 Mg Tabs (Lisinopril) .... Once daily 2)  Tizanidine Hcl 4 Mg Tabs (Tizanidine hcl) .Marland Kitchen.. 1-2 once daily 3)  Lorazepam 1 Mg Tabs (Lorazepam) .... Two times a day 4)  Prilosec 20 Mg Cpdr (Omeprazole) .... Once daily 5)  Celebrex 200 Mg Caps (Celecoxib) .... Two times a day 6)  Vitamin D3 1000 Unit Tabs (Cholecalciferol) .... Once daily 7)  Lasix 40 Mg Tabs (Furosemide) .... As needed 8)  Klor-con M10 10 Meq Tbcr (Potassium chloride crys cr) .... Once daily as needed 9)  Oxycontin 20 Mg Tb12 (Oxycodone hcl) .Marland Kitchen.. 1 tab by mouth two  times a day please,  fill on or after 1/7/9 10)  Oxycodone Hcl 30 Mg Tabs (Oxycodone hcl) .Marland Kitchen.. 1 tab by mouth qid as needed pain please,  fill on or after 1/7/9 11)  Zolpidem Tartrate 10 Mg Tabs (Zolpidem tartrate) .... 1/2 or 1 by mouth at hs prn 12)  Metformin Hcl 1000 Mg Tabs (Metformin hcl) .... Take 1 tablet by mouth two times a day 13)  Wellbutrin Sr 150 Mg Tb12 (Bupropion hcl) .Marland Kitchen.. 1 po bid   Patient  Instructions: 1)  Please schedule a follow-up appointment in 2 months. 2)  BMP prior to visit, ICD-9: 3)  Hepatic Panel prior to visit, ICD-9: 4)  Lipid Panel prior to visit, ICD-9:250.00 995.2  272.0 5)  TSH prior to visit, ICD-9: 6)  HbgA1C prior to visit, ICD-9:    Prescriptions: OXYCODONE HCL 30 MG  TABS (OXYCODONE HCL) 1 tab by mouth qid as needed pain Please,  fill on or after 1/7/9  #120 x 0   Entered and Authorized by:   Cassandria Anger MD   Signed by:   Cassandria Anger MD on 11/08/2007   Method used:   Print then Give to Patient   RxID:   [id number] OXYCODONE HCL 30 MG  TABS (OXYCODONE HCL) 1 tab by mouth qid as needed pain Please,  fill on or after 12/7/8  #120 x 0   Entered and Authorized by:   Cassandria Anger MD   Signed by:   Cassandria Anger MD on 11/08/2007   Method used:   Print then Give to Patient   RxID:   [id number] OXYCONTIN 20 MG  TB12 (OXYCODONE HCL) 1 tab by mouth two times a day Please,  fill on or after 1/7/9  #60 x 0   Entered and Authorized by:   Cassandria Anger MD   Signed by:   Cassandria Anger MD on 11/08/2007   Method used:   Print then Give to Patient   RxID:   [id number] OXYCONTIN 20 MG  TB12 (OXYCODONE HCL) 1 tab by mouth two times a day Please,  fill on or after 12/7/8  #60 x 0   Entered and Authorized by:   Cassandria Anger MD   Signed by:   Cassandria Anger MD on 11/08/2007   Method used:   Print then Give to Patient   RxID:   [id number] WELLBUTRIN SR 150 MG TB12 (BUPROPION HCL) 1 po bid   #180 x 3   Entered and Authorized by:   Cassandria Anger MD   Signed by:   Cassandria Anger MD on 11/08/2007   Method used:   Print then Give to Patient   RxID:   [id number] METFORMIN HCL 1000 MG TABS (METFORMIN HCL) Take 1 tablet by mouth two times a day  #180 x 3   Entered and Authorized by:   Cassandria Anger MD   Signed by:   Cassandria Anger MD on 11/08/2007   Method used:   Print then Give to Patient   RxID:   [id number] ZOLPIDEM TARTRATE 10 MG  TABS (ZOLPIDEM TARTRATE) 1/2 or 1 by mouth at hs prn  #90 x 3   Entered and Authorized by:   Cassandria Anger MD   Signed by:   Cassandria Anger MD on 11/08/2007   Method used:   Print then Give to Patient   RxID:   [id number] CELEBREX 200 MG  CAPS (CELECOXIB) two times a day  #180 x 3   Entered and Authorized by:   Cassandria Anger MD   Signed by:   Cassandria Anger MD on 11/08/2007   Method used:   Print then Give to Patient   RxID:   [id number] PRILOSEC 20 MG  CPDR (OMEPRAZOLE) once daily  #90 x 3   Entered and Authorized by:   Cassandria Anger MD   Signed by:   Cassandria Anger MD on 11/08/2007   Method used:  Print then Give to Patient   RxID:   [id number] LORAZEPAM 1 MG  TABS (LORAZEPAM) two times a day  #180 x 3   Entered and Authorized by:   Cassandria Anger MD   Signed by:   Cassandria Anger MD on 11/08/2007   Method used:   Print then Give to Patient   RxID:   [id number] TIZANIDINE HCL 4 MG  TABS (TIZANIDINE HCL) 1-2 once daily  #180 x 3   Entered and Authorized by:   Cassandria Anger MD   Signed by:   Cassandria Anger MD on 11/08/2007   Method used:   Print then Give to Patient   RxID:   [id number] LISINOPRIL 40 MG  TABS (LISINOPRIL) once daily  #90 x 3   Entered and Authorized by:   Cassandria Anger MD   Signed by:   Cassandria Anger MD on 11/08/2007   Method used:   Print then Give to Patient   [id number]  ]

## 2011-01-12 NOTE — Assessment & Plan Note (Signed)
Summary: 2 MO ROV/NWS   Vital Signs:  Patient Profile:   60 Years Old Female Weight:      237 pounds Temp:     97.8 degrees F oral Pulse rate:   93 / minute BP sitting:   131 / 92  (left arm)  Vitals Entered By: Doralee Albino (January 10, 2008 10:43 AM)             Is Patient Diabetic? Yes  CBG Result 164     Chief Complaint:  Multiple medical problems or concerns.  History of Present Illness: IM Consult Req. by Dr Maureen Ralphs Reason: preop clearance for R TKR in Feb 2009   The patient presents for a follow up of back pain, anxiety, depression and OA.    Current Allergies (reviewed today): ! ERYTHROMYCIN (ERYTHROMYCIN BASE)  Past Medical History:    Reviewed history from 11/08/2007 and no changes required:       Diabetes mellitus, type II       GERD       Hypertension       IBS       FMS       Anxiety       Depression       Low back pain       Osteoarthritis       Foramen magnum ependymoma 2003   Family History:    Reviewed history from 11/08/2007 and no changes required:       Family History Diabetes 1st degree relative  Social History:    Reviewed history from 11/08/2007 and no changes required:       Occupation: Therapist, sports       Single       Alcohol use-no    Review of Systems  The patient denies anorexia, chest pain, syncope, abdominal pain, melena, fever, vision loss, hoarseness, peripheral edema, prolonged cough, hemoptysis, severe indigestion/heartburn, hematuria, abnormal bleeding, and enlarged lymph nodes.         R knee hurts a lot   Physical Exam  General:     NAD Head:     Nl Eyes:     vision grossly intact.   Ears:     no external deformities.   Nose:     Nl Mouth:     Clear Neck:     No deformities, masses, or tenderness noted. Lungs:     CTA Heart:     RRR Abdomen:     Bowel sounds positive,abdomen soft and non-tender without masses, organomegaly or hernias noted. Msk:     Lumbar-sacral spine is tender to palpation over  paraspinal muscles and painfull with the ROM L knee with a scar R knee is tender Pulses:     R and L carotid,radial,femoral,dorsalis pedis and posterior tibial pulses are full and equal bilaterally Extremities:     trace left pedal edema and trace right pedal edema.   Neurologic:     No cranial nerve deficits noted. Station and gait are normal. Plantar reflexes are down-going bilaterally. DTRs are symmetrical throughout. Sensory, motor and coordinative functions appear intact. Skin:     WNL Psych:     good eye contact and depressed affect.      Impression & Recommendations:  Problem # 1:  PREOPERATIVE EXAMINATION (ICD-V72.84) Assessment: Comment Only R TKR Feb 2009 - she is med. clear for surgery Had labs this am  Problem # 2:  OSTEOARTHRITIS (ICD-715.90) Assessment: Unchanged As above Her updated medication list for this problem includes:  Celebrex 200 Mg Caps (Celecoxib) .Marland Kitchen..Marland Kitchen Two times a day    Oxycontin 20 Mg Tb12 (Oxycodone hcl) .Marland Kitchen... 1 tab by mouth two times a day please,  fill on or after 3/7/9    Oxycodone Hcl 30 Mg Tabs (Oxycodone hcl) .Marland Kitchen... 1 tab by mouth qid as needed pain please,  fill on or after 3/7/9   Problem # 3:  DIABETES MELLITUS, TYPE II (ICD-250.00) Assessment: Unchanged On Rx Her updated medication list for this problem includes:    Lisinopril 40 Mg Tabs (Lisinopril) ..... Once daily    Metformin Hcl 1000 Mg Tabs (Metformin hcl) .Marland Kitchen... Take 1 tablet by mouth two times a day   Problem # 4:  LOW BACK PAIN (ICD-724.2) Assessment: Unchanged Chronic Her updated medication list for this problem includes:    Tizanidine Hcl 4 Mg Tabs (Tizanidine hcl) .Marland Kitchen... 1-2 once daily    Celebrex 200 Mg Caps (Celecoxib) .Marland Kitchen..Marland Kitchen Two times a day    Oxycontin 20 Mg Tb12 (Oxycodone hcl) .Marland Kitchen... 1 tab by mouth two times a day please,  fill on or after 3/7/9    Oxycodone Hcl 30 Mg Tabs (Oxycodone hcl) .Marland Kitchen... 1 tab by mouth qid as needed pain please,  fill on or after  3/7/9   Problem # 5:  DEPRESSION (ICD-311) Assessment: Unchanged On Rx Her updated medication list for this problem includes:    Lorazepam 1 Mg Tabs (Lorazepam) .Marland Kitchen..Marland Kitchen Two times a day    Wellbutrin Sr 150 Mg Tb12 (Bupropion hcl) .Marland Kitchen... 1 po bid   Problem # 6:  GERD (ICD-530.81) Assessment: Unchanged On Rx Her updated medication list for this problem includes:    Prilosec 20 Mg Cpdr (Omeprazole) ..... Once daily   Complete Medication List: 1)  Lisinopril 40 Mg Tabs (Lisinopril) .... Once daily 2)  Tizanidine Hcl 4 Mg Tabs (Tizanidine hcl) .Marland Kitchen.. 1-2 once daily 3)  Lorazepam 1 Mg Tabs (Lorazepam) .... Two times a day 4)  Prilosec 20 Mg Cpdr (Omeprazole) .... Once daily 5)  Celebrex 200 Mg Caps (Celecoxib) .... Two times a day 6)  Lasix 40 Mg Tabs (Furosemide) .... As needed 7)  Klor-con M10 10 Meq Tbcr (Potassium chloride crys cr) .... Once daily as needed 8)  Oxycontin 20 Mg Tb12 (Oxycodone hcl) .Marland Kitchen.. 1 tab by mouth two times a day please,  fill on or after 3/7/9 9)  Oxycodone Hcl 30 Mg Tabs (Oxycodone hcl) .Marland Kitchen.. 1 tab by mouth qid as needed pain please,  fill on or after 3/7/9 10)  Zolpidem Tartrate 10 Mg Tabs (Zolpidem tartrate) .... 1/2 or 1 by mouth at hs prn 11)  Metformin Hcl 1000 Mg Tabs (Metformin hcl) .... Take 1 tablet by mouth two times a day 12)  Wellbutrin Sr 150 Mg Tb12 (Bupropion hcl) .Marland Kitchen.. 1 po bid 13)  Vitamin D3 1000 Unit Tabs (Cholecalciferol) .... Once daily  Anticoagulation Management Assessment/Plan:       Patient Instructions: 1)  Please schedule a follow-up appointment in 3 months. 2)  BMP prior to visit, ICD-9:250.00 995.2 3)  Hepatic Panel prior to visit, ICD-9: 4)  Lipid Panel prior to visit, ICD-9: 5)  TSH prior to visit, ICD-9: 6)  HbgA1C prior to visit, ICD-9:    Prescriptions: OXYCODONE HCL 30 MG  TABS (OXYCODONE HCL) 1 tab by mouth qid as needed pain Please,  fill on or after 3/7/9  #120 x 0   Entered and Authorized by:   Cassandria Anger  MD   Signed by:  Evie Lacks Plotnikov MD on 01/10/2008   Method used:   Print then Give to Patient   RxID:   [id number] OXYCONTIN 20 MG  TB12 (OXYCODONE HCL) 1 tab by mouth two times a day Please,  fill on or after 3/7/9  #60 x 0   Entered and Authorized by:   Cassandria Anger MD   Signed by:   Cassandria Anger MD on 01/10/2008   Method used:   Print then Give to Patient   RxID:   [id number] OXYCODONE HCL 30 MG  TABS (OXYCODONE HCL) 1 tab by mouth qid as needed pain Please,  fill on or after 2/7/9  #120 x 0   Entered and Authorized by:   Cassandria Anger MD   Signed by:   Cassandria Anger MD on 01/10/2008   Method used:   Print then Give to Patient   RxID:   [id number] OXYCONTIN 20 MG  TB12 (OXYCODONE HCL) 1 tab by mouth two times a day Please,  fill on or after 2/7/9  #60 x 0   Entered and Authorized by:   Cassandria Anger MD   Signed by:   Cassandria Anger MD on 01/10/2008   Method used:   Print then Give to Patient   [id number]  ]

## 2011-01-12 NOTE — Assessment & Plan Note (Signed)
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 She presents today with ongoing diarrhea of 3 weeks' duration. Patient had similar problems last fall. She eventually required hospitalization and underwent complete colonoscopy September 27, 2008. Endoscopically she was noted to have mild diffuse colitis and ileitis as well as a sigmoid colon polyp. Biopsies revealed mild nonspecific ileitis, moderately severe nonspecific acute colitis, and a tubular adenoma. She was felt possibly to have Clostridium difficile. She was treated with a number of agents but eventually responded to metronidazole and Xifaxan (or seemingly so). She did well until June 19 when similar problems with diarrhea returned. She has up to 10 loose bowel movements daily. There are nocturnal symptoms. She is having lower abdominal cramping. Shortly after presentation she approached me and was treated with metronidazole and Xifaxan. She has been using Imodium up to 4 times daily. Despite this her diarrhea has continued without change. She denies bleeding. No weight loss. She does not feel dehydrated. We ordered laboratories and stool studies. Laboratories today revealed a hemoglobin of 10.3 (12.6 in January). BUN 34 and creatinine 1.5 (39 and 1.0 in January). Her potassium today was 5.4. She has an uncle with Crohn's disease. She had one fever. She traveled to [place] last week. Her problems began before travel. She is on no new medications. She's been on metformin for years. She has had diabetes for 13 years at least. She is on oral agents but no insulin.   GI Review of Systems    Reports  abdominal pain and  acid reflux.     Location of  Abdominal pain: generalized.    Denies belching, bloating, chest pain, dysphagia with liquids, dysphagia with solids, heartburn, loss of appetite, nausea, vomiting, vomiting blood, weight loss, and  weight gain.      Reports diarrhea, diverticulosis, and  irritable bowel syndrome.     Denies anal fissure, black tarry stools, change in bowel habit, constipation, fecal incontinence, heme positive stool, hemorrhoids, jaundice, light color stool, liver problems, rectal bleeding, and  rectal pain. Visit Type:  Follow-up Visit Primary Care Provider:  [name], MD  Chief Complaint:  diarrhea/abdominal pain ( started 6/19).  History of Present Illness: iarrhea    Current Medications (verified): 1)  Lisinopril 40 Mg  Tabs (Lisinopril) .... Once Daily 2)  Tizanidine Hcl 4 Mg  Tabs (Tizanidine Hcl) .Marland Kitchen.. 1-2 Once Daily 3)  Prilosec 20 Mg  Cpdr (Omeprazole) .... Once Daily 4)  Celebrex 200 Mg  Caps (Celecoxib) .... Two Times A Day 5)  Oxycontin 20 Mg  Tb12 (Oxycodone Hcl) .Marland Kitchen.. 1 Tab By Mouth Two Times A Day Please,  Fill On or After 05/29/2009 6)  Oxycodone Hcl 15 Mg  Tabs (Oxycodone Hcl) .Marland Kitchen.. 1 By Mouth Qid As Needed Pain. Please,  Fill On or After 05/29/2009 7)  Metformin Hcl 1000 Mg Tabs (Metformin Hcl) .... Take 1 Tablet By Mouth Two Times A Day 8)  Wellbutrin Sr 150 Mg Tb12 (Bupropion Hcl) .Marland Kitchen.. 1 Po Bid 9)  Cyclobenzaprine Hcl 10 Mg  Tabs (Cyclobenzaprine Hcl) .... Three Times A Day As Needed For Back Pain 10)  Vitamin D3 1000 Unit  Tabs (Cholecalciferol) .... Once Daily 11)  Detrol La 4 Mg  Cp24 (Tolterodine Tartrate) .Marland Kitchen.. 1 Tablet By Mouth Daily 12)  Glimepiride 1 Mg Tabs (Glimepiride) .Marland Kitchen.. 1 Po Qd 13)  Onetouch Ultra Test   Strp (Glucose Blood) .... Use Once Daily To Two Times A Day 14)  Onetouch Ultrasoft Lancets   Misc (Lancets) .... Use Once Daily To Two Times A Day 15)  Lorazepam 2 Mg Tabs (Lorazepam) .... 1/2 or 1 By Mouth Two  Times A Day Prn 16)  Femhrt Low Dose 0.5-2.5 Mg-Mcg Tabs (Norethindrone-Eth Estradiol) .... Once Daily 17)  Lunesta 3 Mg Tabs (Eszopiclone) .... One By Mouth At Bedtime As Needed 18)  Lidoderm 5 % Ptch (Lidocaine) .Marland Kitchen.. 1 To 3 Patches Qday 19)  Fiber 625 Mg Tabs (Calcium Polycarbophil) .... Take 4 Daily  Allergies (verified): 1)  ! Erythromycin (Erythromycin Base)  Past History:  Past Medical History: Reviewed history from 01/15/2009 and no changes required. Diabetes mellitus, type II GERD Hypertension IBS FMS Anxiety Depression Low back pain Osteoarthritis Foramen magnum ependymoma 2003 L eye vitreous humor liquification  Past Surgical History: Reviewed history from 04/10/2008 and no changes required. Foramen magnum ependymoma  surgery 2003 Dr Rita Ohara Total knee replacement L 2008, R 2009  Dr Maureen Ralphs  Family History: Reviewed history from 11/08/2007 and no changes required. Family History Diabetes 1st degree relative  Social History: Reviewed history from 11/08/2007 and no changes required. Occupation: Therapist, sports Single Alcohol use-no  Review of Systems       back pain, knee pain, hip pain. Depression. All other systems reviewed and reported as negative  Vital Signs:  Patient profile:   60 year old female Height:      62 inches Weight:      181.50 pounds BMI:     33.32 Temp:     97.9 degrees F Pulse rate:   96 / minute Pulse rhythm:   regular BP sitting:   124 / 62  (left arm) Cuff size:   regular  Vitals Entered By: June McMurray CMA (June 18, 2009 3:44 PM)  Physical Exam  General:  Well developed, well nourished, no acute distress. Head:  Normocephalic and atraumatic. Eyes:  PERRLA, no icterus. Ears:  Normal auditory acuity. Nose:  No deformity, discharge,  or lesions. Mouth:  No deformity or lesions, dentition normal. Neck:  Supple; no masses or thyromegaly. Lungs:  Clear throughout to auscultation. Heart:  Regular rate and rhythm; no murmurs, rubs,  or  bruits. Abdomen:  Soft, nontender and nondistended. No masses, hepatosplenomegaly or hernias noted. Normal bowel sounds. Msk:  Symmetrical with no gross deformities. Normal posture. Pulses:  Normal pulses noted. Extremities:  No clubbing, cyanosis, edema or deformities noted. Neurologic:  Alert and  oriented x4;  grossly normal neurologically. Skin:  Intact without significant lesions or rashes. Psych:  Alert and cooperative. Normal mood and affect.   Impression & Recommendations:  Problem # 1:  DIARRHEA (ICD-787.91) 3 week history of persistent diarrhea associated with abdominal discomfort. No response to metronidazole and Xifaxan or improvement with Imodium. Concerning is the previous history of similar problems last fall culminating in a colonoscopy showing nonspecific diffuse colitis and mild ileitis. Could this be inflammatory bowel disease? C. difficile colitis? Or microscopic colitis? The interval development of anemia is somewhat concerning.  Plan: #1. Stool for ova and parasites, enteric pathogens, Clostridium difficile toxin #2. Schedule colonoscopy with biopsies. The patient procedures wells risks, benefits, and alternatives were reviewed in detail. She understood and agreed to proceed. Movi prep prescribed.. The patient instructed on his use. #  3. The patient is at higher than baseline risk due to comorbidities and diabetes. We will hold her diabetic agents the day of the exam to minimize the risk of unwanted hypoglycemia. She has been instructed. #4. Prescribed Lomotil one to 2 p.o. q.4-6 hours p.r.n.  Problem # 2:  RENAL INSUFFICIENCY (ICD-588.9) new onset renal insufficiency. She also has mild hyperkalemia.This may be due to dehydration in the face of ACE inhibitor use. Could be related to her chronic diabetes and hypertension. I have recommended that she hold lisinopril. Vigorous hydration stressed. We will need to repeat her chemistries next week. She will monitor her blood  pressure in the interim. If her renal insufficiency persists she may need to see [name] or a nephrologist or both.  Problem # 3:  ANEMIA-UNSPECIFIED (ICD-285.9) internal anemia. Etiology uncertain.  Plan: #1. Anemia panel next week with repeat chemistries.  Problem # 4:  COLITIS (ICD-558.9) please see discussion under #1 above  Problem # 5:  DIABETES MELLITUS, TYPE II (ICD-250.00) ongoing problem. Adjustment of diabetic medications for the procedure as previously reviewed.  Problem # 6:  GERD (ICD-530.81) stable on Prilosec. Continue Prilosec.  Other Orders: Colonoscopy (Colon)  Patient Instructions: 1)  colonoscopy [place] 06/23/09  3:00 pm 2)  Movi prep instructions given to patient 3)  Movi prep Rx. sent to pharmacy 4)  Colonoscopy and Flexible Sigmoidoscopy brochure given.  5)  Lomotil Rx. #60 1-2 by mouth q 4-6 hours as needed  x 1 RF. 6)  The medication list was reviewed and reconciled.  All changed / newly prescribed medications were explained.  A complete medication list was provided to the patient / caregiver. 7)  The medication list was reviewed and reconciled.  All changed / newly prescribed medications were explained.  A complete medication list was provided to the patient / caregiver. 8)  Copy to: Dr. [name] Prescriptions: LOMOTIL 2.5-0.025 MG TABS (DIPHENOXYLATE-ATROPINE) 1-2 by mouth q 4-6 hours as needed  #60 x 1   Entered by:   Randye Lobo CMA   Authorized by:   Irene Shipper MD   Signed by:   Randye Lobo CMA on 06/18/2009   Method used:   Print then Give to Patient   RxID:   [id number] MOVIPREP 100 GM  SOLR (PEG-KCL-NACL-NASULF-NA ASC-C) As per prep instructions.  #1 x 0   Entered by:   Randye Lobo CMA   Authorized by:   Irene Shipper MD   Signed by:   Randye Lobo CMA on 06/18/2009   Method used:   Electronically to        [facility] (retail)       [place], [place]  [id number]       Ph:  [phone]       Fax: [phone]   RxID:   [id number]

## 2011-01-12 NOTE — Progress Notes (Signed)
Summary: Rf gen Flexeril  Phone Note Refill Request Message from:  Pharmacy  Refills Requested: Medication #1:  CYCLOBENZAPRINE HCL 10 MG  TABS three times a day as needed for back pain   Supply Requested: 270   Last Refilled: 04/05/2010  Method Requested: Electronic Next Appointment Scheduled: 07-15-10 Initial call taken by: Jonathon Resides, [facility]),  July 06, 2010 8:13 AM  Follow-up for Phone Call        ok x 6 Follow-up by: Cassandria Anger MD,  July 06, 2010 9:43 PM    Prescriptions: CYCLOBENZAPRINE HCL 10 MG  TABS (CYCLOBENZAPRINE HCL) three times a day as needed for back pain  #270 x 6   Entered by:   Jonathon Resides, [place])   Authorized by:   Cassandria Anger MD   Signed by:   Jonathon Resides, [facility]) on 07/07/2010   Method used:   Electronically to        [facility] (retail)       [address]       [place],   [address]       Ph: [phone]       Fax: [phone]   RxIDTU:[id number]

## 2011-01-12 NOTE — Progress Notes (Signed)
Summary: REFILL - PAIN MEDICATION  Phone Note Refill Request Call back at Home Phone [phone] Call back at Work Phone [phone]   Refills Requested: Medication #1:  OXYCODONE HCL 15 MG  TABS 1 by mouth qid as needed pain. Please  Medication #2:  OXYCONTIN 20 MG  TB12 1 tab by mouth two times a day Please  Medication #3:  LORAZEPAM 2 MG TABS 1/2 or 1 by mouth two times a day prn Initial call taken by: Charlsie Quest, [place],  May 13, 2010 6:41 PM  Follow-up for Phone Call        ok to ref Follow-up by: Cassandria Anger MD,  May 14, 2010 7:50 AM  Additional Follow-up for Phone Call Additional follow up Details #1::        Left vm on hm 3 that rx's are ready Additional Follow-up by: Charlsie Quest, CMA,  May 14, 2010 10:56 AM    New/Updated Medications: OXYCODONE HCL 15 MG  TABS (OXYCODONE HCL) 1 by mouth qid as needed pain. Please,  fill on or after 05/14/2010 OXYCONTIN 20 MG  TB12 (OXYCODONE HCL) 1 tab by mouth two times a day Please,  fill on or after 05/14/2010 [BMN] LORAZEPAM 2 MG TABS (LORAZEPAM) 1/2 or 1 by mouth two times a day prn Prescriptions: LORAZEPAM 2 MG TABS (LORAZEPAM) 1/2 or 1 by mouth two times a day prn  #60 x 0   Entered by:   Charlsie Quest, CMA   Authorized by:   Cassandria Anger MD   Signed by:   Charlsie Quest, CMA on 05/14/2010   Method used:   Print then Give to Patient   RxID:   [id number] OXYCONTIN 20 MG  TB12 (OXYCODONE HCL) 1 tab by mouth two times a day Please,  fill on or after 05/14/2010 Brand medically necessary #60 x 0   Entered by:   Charlsie Quest, CMA   Authorized by:   Cassandria Anger MD   Signed by:   Charlsie Quest, CMA on 05/14/2010   Method used:   Print then Give to Patient   RxID:   [id number] OXYCODONE HCL 15 MG  TABS (OXYCODONE HCL) 1 by mouth qid as needed pain. Please,  fill on or after 05/14/2010  #120 x 0   Entered by:   Charlsie Quest, CMA   Authorized by:   Cassandria Anger MD   Signed by:   Charlsie Quest, CMA on 05/14/2010   Method used:   Print then Give to Patient   RxID:   [id number]

## 2011-01-12 NOTE — Progress Notes (Signed)
Summary: refill request  Phone Note Refill Request Message from:  Fax from Pharmacy on April 01, 2010 11:24 AM  Refills Requested: Medication #1:  CYCLOBENZAPRINE HCL 10 MG  TABS three times a day as needed for back pain Next Appointment Scheduled: 06-03-10 Initial call taken by: Ernestene Mention,  April 01, 2010 11:24 AM  Follow-up for Phone Call        ok 3 ref Follow-up by: Cassandria Anger MD,  April 01, 2010 1:05 PM    Prescriptions: CYCLOBENZAPRINE HCL 10 MG  TABS (CYCLOBENZAPRINE HCL) three times a day as needed for back pain  #270 x 0   Entered by:   Charlsie Quest, CMA   Authorized by:   Cassandria Anger MD   Signed by:   Charlsie Quest, CMA on 04/01/2010   Method used:   Electronically to        [facility] (retail)       [address]       [place], [place]  [address]       Ph: [phone]       Fax: [phone]   RxID:   [id number]

## 2011-01-12 NOTE — Progress Notes (Signed)
       Additional Follow-up for Phone Call Additional follow up Details #2::    pt informed Needs renal US Follow-up by: Cassandria Anger MD,  April 10, 2010 5:45 PM

## 2011-01-12 NOTE — Assessment & Plan Note (Signed)
Summary: 3 MO ROV /NWS   Vital Signs:  Patient profile:   60 year old female Weight:      182 pounds Temp:     97 degrees F oral Pulse rate:   79 / minute BP sitting:   114 / 66  (left arm)  Vitals Entered By: Doralee Albino (February 04, 2010 1:26 PM) CC: f/u Is Patient Diabetic? Yes   Primary Care Provider:  [name], MD  CC:  f/u.  History of Present Illness: The patient presents for a follow up of back pain, anxiety, depression and headaches. C/o many cavities x 2 y, dry mouth  Preventive Screening-Counseling & Management  Alcohol-Tobacco     Smoking Status: quit  Current Medications (verified): 1)  Oxycodone Hcl 15 Mg  Tabs (Oxycodone Hcl) .Marland Kitchen.. 1 By Mouth Qid As Needed Pain. Please,  Fill On or After 01/13/2010 2)  Oxycontin 20 Mg  Tb12 (Oxycodone Hcl) .Marland Kitchen.. 1 Tab By Mouth Two Times A Day Please,  Fill On or After 01/13/2010 3)  Cyclobenzaprine Hcl 10 Mg  Tabs (Cyclobenzaprine Hcl) .... Three Times A Day As Needed For Back Pain 4)  Lisinopril 40 Mg  Tabs (Lisinopril) .... Take 1/2  Tablet Daily 5)  Prilosec 20 Mg  Cpdr (Omeprazole) .... Once Daily 6)  Celebrex 200 Mg  Caps (Celecoxib) .... Two Times A Day 7)  Metformin Hcl 1000 Mg Tabs (Metformin Hcl) .... Take 1 Tablet By Mouth Two Times A Day 8)  Wellbutrin Sr 150 Mg Tb12 (Bupropion Hcl) .Marland Kitchen.. 1 Po Bid 9)  Vitamin D3 1000 Unit  Tabs (Cholecalciferol) .... Once Daily 10)  Detrol La 4 Mg Cp24 (Tolterodine Tartrate) .Marland Kitchen.. 1 Tablet By Mouth Daily 11)  Glimepiride 1 Mg Tabs (Glimepiride) .Marland Kitchen.. 1 Po Qd 12)  Onetouch Ultra Test   Strp (Glucose Blood) .... Use Once Daily To Two Times A Day 13)  Onetouch Ultrasoft Lancets   Misc (Lancets) .... Use Once Daily To Two Times A Day 14)  Lorazepam 2 Mg Tabs (Lorazepam) .... 1/2 or 1 By Mouth Two Times A Day Prn 15)  Lidoderm 5 % Ptch (Lidocaine) .Marland Kitchen.. 1 To 3 Patches Qday 16)  Fiber 625 Mg Tabs (Calcium Polycarbophil) .... Take 4 Daily 17)  Lomotil 2.5-0.025 Mg Tabs  (Diphenoxylate-Atropine) .Marland Kitchen.. 1-2 By Mouth Q 4-6 Hours As Needed 18)  Estradiol 0.5 Mg Tabs (Estradiol) .Marland Kitchen.. 1 Every Other Day 19)  Medroxyprogesterone Acetate 2.5 Mg Tabs (Medroxyprogesterone Acetate) .Marland Kitchen.. 1 Every Other Day 20)  Triamcinolone Acetonide 0.1 % Oint (Triamcinolone Acetonide) .... Use 1-2 Times A Day  Allergies: 1)  ! 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 quit 1990 Daily Caffeine Use -2  Review of Systems  The patient denies fever, chest pain, dyspnea on exertion, abdominal pain, and hematochezia.    Physical Exam  General:  Well developed, well nourished, no acute distress. Nose:  No deformity, discharge,  or lesions. Mouth:  No deformity or lesions, dentition normal. Lungs:  Clear throughout to auscultation. Heart:  Regular rate and rhythm; no murmurs, rubs,  or bruits. Abdomen:  Soft, nontender and nondistended. No masses, hepatosplenomegaly or hernias noted. Normal bowel sounds. Msk:  Lumbar-sacral spine is tender to  palpation over paraspinal muscles and painfull with the ROM  B knees w/post-op changes Extremities:  No clubbing, cyanosis, edema or deformities noted. Neurologic:  Alert and  oriented x4;  grossly normal neurologically. Skin:  Intact without significant lesions or rashes. Psych:  Alert and cooperative. Normal mood and affect.   Impression & Recommendations:  Problem # 1:  LOW BACK PAIN (ICD-724.2) Assessment Unchanged  Her updated medication list for this problem includes:    Oxycodone Hcl 15 Mg Tabs (Oxycodone hcl) .Marland Kitchen... 1 by mouth qid as needed pain. please,  fill  on or after 03/13/2010    Oxycontin 20 Mg Tb12 (Oxycodone hcl) .Marland Kitchen... 1 tab by mouth two times a day please,  fill on or after 03/13/2010    Cyclobenzaprine Hcl 10 Mg Tabs (Cyclobenzaprine hcl) .Marland Kitchen... 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   Problem # 4:  DIABETES MELLITUS, TYPE II (ICD-250.00) Assessment: Unchanged  Her updated medication list for this problem includes:    Lisinopril 40 Mg Tabs (Lisinopril) .Marland Kitchen... Take 1/2  tablet daily    Metformin Hcl 1000 Mg Tabs (Metformin hcl) .Marland Kitchen... Take 1 tablet by mouth two times a day    Glimepiride 1 Mg Tabs (Glimepiride) .Marland Kitchen... 1 po qd  Labs Reviewed: Creat: 1.1 (01/28/2010)    Reviewed HgBA1c results: 6.2 (01/28/2010)  6.3 (10/27/2009)  Problem # 5:  HYPERTENSION (ICD-401.9) Assessment: Comment Only  Her updated medication list for this problem includes:    Lisinopril 40 Mg Tabs (Lisinopril) .Marland Kitchen... Take 1/2  tablet daily  BP today: 114/66 Prior BP: 120/80 (10/29/2009)  Labs Reviewed: K+: 4.9 (01/28/2010) Creat: : 1.1 (01/28/2010)   Chol: 170 (10/27/2009)   HDL: 65.80 (10/27/2009)   LDL: 95 (10/27/2009)   TG: 48.0 (10/27/2009)  Complete Medication List: 1)  Oxycodone Hcl 15 Mg Tabs (Oxycodone hcl) .Marland Kitchen.. 1 by mouth qid as needed pain. please,  fill on or after 03/13/2010 2)  Oxycontin 20 Mg Tb12 (Oxycodone hcl) .Marland Kitchen.. 1 tab by mouth two times a day please,  fill on or after 03/13/2010 3)  Cyclobenzaprine Hcl 10 Mg Tabs (Cyclobenzaprine hcl) .... Three times a day as needed for back pain 4)  Lisinopril 40 Mg Tabs (Lisinopril) .... Take 1/2  tablet daily 5)  Prilosec 20 Mg Cpdr (Omeprazole) .... Once daily 6)  Celebrex 200 Mg Caps (Celecoxib) .... Two times a day 7)   Metformin Hcl 1000 Mg Tabs (Metformin hcl) .... Take 1 tablet by mouth two times a day 8)  Wellbutrin Sr 150 Mg Tb12 (Bupropion hcl) .Marland Kitchen.. 1 po bid 9)  Vitamin D3 1000 Unit Tabs (Cholecalciferol) .... Once daily 10)  Glimepiride 1 Mg Tabs (Glimepiride) .Marland Kitchen.. 1 po qd 11)  Onetouch Ultra Test Strp (Glucose blood) .... Use once daily to two times a day 12)  Onetouch Ultrasoft Lancets Misc (Lancets) .... Use once daily to two times a day 13)  Lorazepam 2 Mg Tabs (Lorazepam) .... 1/2 or 1 by mouth two times a day prn 14)  Lidoderm 5 % Ptch (Lidocaine) .Marland Kitchen.. 1 to 3 patches qday 15)  Fiber 625 Mg Tabs (Calcium polycarbophil) .... Take 4 daily 16)  Lomotil 2.5-0.025 Mg Tabs (Diphenoxylate-atropine) .Marland Kitchen.. 1-2 by mouth q 4-6  hours as needed 17)  Estradiol 0.5 Mg Tabs (Estradiol) .Marland Kitchen.. 1 every other day 18)  Medroxyprogesterone Acetate 2.5 Mg Tabs (Medroxyprogesterone acetate) .Marland Kitchen.. 1 every other day 19)  Triamcinolone Acetonide 0.1 % Oint (Triamcinolone acetonide) .... Use 1-2 times a day 20)  Oxytrol 3.9 Mg/24hr Pttw (Oxybutynin) .... Change daily  Other Orders: Pneumococcal Vaccine [phone]) Admin 1st Vaccine [phone]) Admin 1st Vaccine [facility]) [phone])  Patient Instructions: 1)  Please schedule a follow-up appointment in 4 months. 2)  BMP prior to visit, ICD-9: 3)  Hepatic Panel prior to visit, ICD-9:250.00 4)  vit b12 782.0 5)  Lipid Panel prior to visit, ICD-9: 6)  HbgA1C prior to visit, ICD-9: Prescriptions: OXYCONTIN 20 MG  TB12 (OXYCODONE HCL) 1 tab by mouth two times a day Please,  fill on or after 03/13/2010 Brand medically necessary #60 x 0   Entered and Authorized by:   Cassandria Anger MD   Signed by:   Cassandria Anger MD on 02/04/2010   Method used:   Print then Give to Patient   RxID:   [id number] OXYCODONE HCL 15 MG  TABS (OXYCODONE HCL) 1 by mouth qid as needed pain. Please,  fill on or after 03/13/2010  #120 x 0   Entered and Authorized by:   Cassandria Anger MD    Signed by:   Cassandria Anger MD on 02/04/2010   Method used:   Print then Give to Patient   RxID:   [id number] OXYCONTIN 20 MG  TB12 (OXYCODONE HCL) 1 tab by mouth two times a day Please,  fill on or after 02/10/2010 Brand medically necessary #60 x 0   Entered and Authorized by:   Cassandria Anger MD   Signed by:   Cassandria Anger MD on 02/04/2010   Method used:   Print then Give to Patient   RxID:   [id number] OXYCODONE HCL 15 MG  TABS (OXYCODONE HCL) 1 by mouth qid as needed pain. Please,  fill on or after 02/10/2010  #120 x 0   Entered and Authorized by:   Cassandria Anger MD   Signed by:   Cassandria Anger MD on 02/04/2010   Method used:   Print then Give to Patient   RxID:   [id number] OXYTROL 3.9 MG/24HR PTTW (OXYBUTYNIN) change daily  #30 x 12   Entered and Authorized by:   Cassandria Anger MD   Signed by:   Cassandria Anger MD on 02/04/2010   Method used:   Electronically to        [facility] (retail)       [address]       [place], [place]  [address]       Ph: [phone]       Fax: [phone]   RxID:   [id number]    Pneumovax Vaccine    Vaccine Type: Pneumovax    Site: right deltoid    Mfr: Merck    Dose: 0.5 ml    Route: IM    Given by: Doralee Albino    Exp. Date: 04/02/2011    Lot #: [id number]    VIS given: 07/10/96 version given February 04, 2010.

## 2011-01-12 NOTE — Progress Notes (Signed)
Summary: RF PAIN MED  Phone Note Refill Request Call back at Home Phone [phone]   Refills Requested: Medication #1:  OXYCONTIN 20 MG  TB12 1 tab by mouth two times a day Please  Medication #2:  OXYCODONE HCL 15 MG  TABS 1 by mouth qid as needed pain. Please Initial call taken by: Charlsie Quest,  October 15, 2008 1:23 PM  Follow-up for Phone Call        OK Follow-up by: Cassandria Anger MD,  October 15, 2008 1:28 PM  Additional Follow-up for Phone Call Additional follow up Details #1::        Pt recieved refills at office visit  Additional Follow-up by: Charlsie Quest,  October 17, 2008 8:28 AM

## 2011-01-12 NOTE — Progress Notes (Signed)
Summary: [facility] Recommendation  Phone Note Other Incoming   Caller: [facility] Summary of Call: Office called stating that Dr. Moshe Cipro recommends the pt stop Lisinopril and Celebrex. They have not seen her yet, her appt is TBD. The patient has not yet been notified of this. Please advise. Initial call taken by: Ernestene Mention,  Apr 22, 2010 11:41 AM  Follow-up for Phone Call        Agree - pls stop both Follow-up by: Cassandria Anger MD,  Apr 22, 2010 5:19 PM  Additional Follow-up for Phone Call Additional follow up Details #1::        left message on machine to call back to office. Additional Follow-up by: Ernestene Mention,  Apr 23, 2010 8:51 AM    Additional Follow-up for Phone Call Additional follow up Details #2::    pt was reluctant but did agree to D/C Lisinopril and Celebrex.   Pt will contact [facility] with the name of specific MD she would like to see. Follow-up by: Crissie Sickles, [place],  Apr 23, 2010 4:50 PM

## 2011-01-12 NOTE — Progress Notes (Signed)
Summary: RF Lorazepam  Phone Note Refill Request Message from:  Patient  Refills Requested: Medication #1:  LORAZEPAM 2 MG TABS 1/2 or 1 by mouth two times a day prn TO GO TO [facility]  Initial call taken by: Charlsie Quest, [place],  August 18, 2010 4:48 PM  Follow-up for Phone Call        ok 3 ref Follow-up by: Cassandria Anger MD,  August 18, 2010 4:53 PM  Additional Follow-up for Phone Call Additional follow up Details #1::        Rx called to pharmacy Additional Follow-up by: Jonathon Resides, [facility]),  August 19, 2010 8:13 AM    Prescriptions: LORAZEPAM 2 MG TABS (LORAZEPAM) 1/2 or 1 by mouth two times a day prn  #60 x 3   Entered by:   Jonathon Resides, [facility])   Authorized by:   Cassandria Anger MD   Signed by:   Jonathon Resides, CMA(AAMA) on 08/19/2010   Method used:   Telephoned to ...       [facility] (retail)       [id number]       [place], [place]  [address]       Ph: [phone]       Fax: [phone]   RxID:   [id number]

## 2011-01-27 ENCOUNTER — Other Ambulatory Visit: Payer: Self-pay | Admitting: Internal Medicine

## 2011-01-27 ENCOUNTER — Encounter (INDEPENDENT_AMBULATORY_CARE_PROVIDER_SITE_OTHER): Payer: Self-pay | Admitting: *Deleted

## 2011-01-27 ENCOUNTER — Other Ambulatory Visit: Payer: Commercial Managed Care - PPO

## 2011-01-27 DIAGNOSIS — E119 Type 2 diabetes mellitus without complications: Secondary | ICD-10-CM

## 2011-01-27 LAB — BASIC METABOLIC PANEL
BUN: 18 mg/dL (ref 6–23)
CO2: 29 mEq/L (ref 19–32)
Calcium: 9.1 mg/dL (ref 8.4–10.5)
Chloride: 103 mEq/L (ref 96–112)
Creatinine, Ser: 0.8 mg/dL (ref 0.4–1.2)
GFR: 74.79 mL/min (ref >60.00)
Glucose, Bld: 161 mg/dL — ABNORMAL HIGH (ref 70–99)
Potassium: 4.9 mEq/L (ref 3.5–5.1)
Sodium: 139 mEq/L (ref 135–145)

## 2011-01-27 LAB — HEMOGLOBIN A1C: Hgb A1c MFr Bld: 6.3 % (ref 4.6–6.5)

## 2011-02-03 ENCOUNTER — Ambulatory Visit (INDEPENDENT_AMBULATORY_CARE_PROVIDER_SITE_OTHER): Payer: 59 | Admitting: Internal Medicine

## 2011-02-03 ENCOUNTER — Encounter: Payer: Self-pay | Admitting: Internal Medicine

## 2011-02-03 DIAGNOSIS — M545 Low back pain, unspecified: Secondary | ICD-10-CM

## 2011-02-03 DIAGNOSIS — R5381 Other malaise: Secondary | ICD-10-CM

## 2011-02-03 DIAGNOSIS — K047 Periapical abscess without sinus: Secondary | ICD-10-CM

## 2011-02-03 DIAGNOSIS — E119 Type 2 diabetes mellitus without complications: Secondary | ICD-10-CM

## 2011-02-11 ENCOUNTER — Telehealth: Payer: Self-pay | Admitting: Internal Medicine

## 2011-02-18 NOTE — Progress Notes (Signed)
Summary: HANDICAP STICKER  Phone Note Call from Patient Call back at Home Phone [phone]   Caller: Patient Reason for Call: Talk to Nurse Summary of Call: [name], HERE MOTHER USE TO BE A PT HERE, ANN Nicholson Initial call taken by: Darnell Level,  February 11, 2011 4:05 PM  Follow-up for Phone Call        Left vm on pt's hm # that we were unable to provide signed form for someone who is not a pt here.  Follow-up by: Charlsie Quest, [place],  February 11, 2011 5:38 PM

## 2011-02-18 NOTE — Assessment & Plan Note (Signed)
Summary: 3 MO ROV /NWS   Vital Signs:  Patient profile:   60 year old female Height:      62 inches Weight:      213 pounds BMI:     39.10 Temp:     98.2 degrees F oral Pulse rate:   80 / minute Pulse rhythm:   regular Resp:     16 per minute BP sitting:   150 / 80  (left arm) Cuff size:   regular  Vitals Entered By: Jonathon Resides, CMA(AAMA) (February 03, 2011 1:34 PM) CC: 3 mo f/u  Is Patient Diabetic? Yes Comments pt is not taking Oramoist,Gelnique or Celebrex    Primary Care Provider:  [name], MD  CC:  3 mo f/u .  History of Present Illness: The patient presents for a follow up of hypertension, diabetes, hyperlipidemia  and B knee pain  Current Medications (verified): 1)  Oxycodone Hcl 15 Mg  Tabs (Oxycodone Hcl) .Marland Kitchen.. 1 By Mouth Qid As Needed Pain. Please,  Fill On or After 01/17/2011 2)  Oxycontin 20 Mg  Tb12 (Oxycodone Hcl) .Marland Kitchen.. 1 Tab By Mouth Two Times A Day Please,  Fill On or After 01/17/2011 3)  Cyclobenzaprine Hcl 10 Mg  Tabs (Cyclobenzaprine Hcl) .... Three Times A Day As Needed For Back Pain 4)  Prilosec 20 Mg  Cpdr (Omeprazole) .... Once Daily 5)  Celebrex 200 Mg  Caps (Celecoxib) .... Two Times A Day 6)  Metformin Hcl 1000 Mg Tabs (Metformin Hcl) .... Take 1 Tablet By Mouth Two Times A Day 7)  Wellbutrin Sr 150 Mg Tb12 (Bupropion Hcl) .Marland Kitchen.. 1 Po Bid 8)  Vitamin D3 1000 Unit  Tabs (Cholecalciferol) .... Once Daily 9)  Glimepiride 1 Mg Tabs (Glimepiride) .Marland Kitchen.. 1 Po Qd 10)  Onetouch Ultra Test   Strp (Glucose Blood) .... Use Once Daily To Two Times A Day 11)  Onetouch Ultrasoft Lancets   Misc (Lancets) .... Use Once Daily To Two Times A Day 12)  Lorazepam 2 Mg Tabs (Lorazepam) .... 1/2 or 1 By Mouth Two Times A Day Prn 13)  Lidoderm 5 % Ptch (Lidocaine) .Marland Kitchen.. 1 To 3 Patches Qday 14)  Fiber 625 Mg Tabs (Calcium Polycarbophil) .... Take 4 Daily 15)  Lomotil 2.5-0.025 Mg Tabs (Diphenoxylate-Atropine) .Marland Kitchen.. 1-2 By Mouth Q 4-6 Hours As Needed 16)  Estradiol 0.5  Mg Tabs (Estradiol) .Marland Kitchen.. 1 Every Other Day 17)  Medroxyprogesterone Acetate 2.5 Mg Tabs (Medroxyprogesterone Acetate) .Marland Kitchen.. 1 Every Other Day 18)  Ibuprofen 600 Mg Tabs (Ibuprofen) .Marland Kitchen.. 1 By Mouth Bid  Pc X 1 Wk Then As Needed For  Pain 19)  Gelnique 10 % Gel (Oxybutynin Chloride) .Marland Kitchen.. 1 On Skin Qd 20)  Oramoist ..Marland Kitchen. 1 As Two Times A Day Dirrected 21)  Coreg 12.5 Mg Tabs (Carvedilol) .Marland Kitchen.. 1 By Mouth Bid 22)  Pilocarpine Hcl 2 % Soln (Pilocarpine Hcl) .... Use Up To Four Times A Day 23)  Oxybutynin Chloride 5 Mg Tabs (Oxybutynin Chloride) .Marland Kitchen.. 1 By Mouth Two Times A Day (1/2 By Mouth Once Daily)  Allergies (verified): 1)  ! 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 quit 1990 Daily Caffeine Use -2  Review  of Systems       The patient complains of weight gain.  The patient denies fever, chest pain, and dyspnea on exertion.         had a tooth abscess  Physical Exam  General:  Well developed, well nourished, in no acute distress. Looks tired... Ears:  Normal auditory acuity. Nose:  No deformity, discharge,  or lesions. Mouth:  Not dry Neck:  Supple; no masses or thyromegaly. Lungs:  Clear throughout to auscultation. Heart:  Regular rate and rhythm; no murmurs, rubs,  or bruits. Abdomen:  Soft, nontender and nondistended. No masses, hepatosplenomegaly or hernias noted. Normal bowel sounds. Msk:  Lumbar-sacral spine is tender to palpation over paraspinal muscles and painfull with the ROM  B knees w/post-op changes Extremities:  No clubbing, cyanosis, edema or deformities  noted. Neurologic:  Alert and  oriented x4;  grossly normal neurologically. Skin:  Intact without significant lesions or rashes. Psych:  Alert and cooperative. tearful and slightly anxious.     Impression & Recommendations:  Problem # 1:  DIABETES MELLITUS, TYPE II (ICD-250.00) Assessment Unchanged  Her updated medication list for this problem includes:    Metformin Hcl 1000 Mg Tabs (Metformin hcl) .Marland Kitchen... Take 1 tablet by mouth two times a day    Glimepiride 1 Mg Tabs (Glimepiride) .Marland Kitchen... 1 po qd  Problem # 2:  ABSCESS, TOOTH (ICD-522.5) Assessment: Deteriorated On antibiotic now - Amox finished  Problem # 3:  HYPERTENSION (ICD-401.9) Assessment: Deteriorated Call if BP is up Her updated medication list for this problem includes:    Coreg 12.5 Mg Tabs (Carvedilol) .Marland Kitchen... 1 by mouth bid  BP today: 150/80 Prior BP: 120/78 (10/28/2010)  Labs Reviewed: K+: 4.9 (01/27/2011) Creat: : 0.8 (01/27/2011)   Chol: 176 (10/21/2010)   HDL: 65.80 (10/21/2010)   LDL: 105 (10/21/2010)   TG: 27.0 (10/21/2010)  Problem # 4:  OSTEOARTHRITIS (ICD-715.90) Assessment: Unchanged  Her updated medication list for this problem includes:    Oxycodone Hcl 15 Mg Tabs (Oxycodone hcl) .Marland Kitchen... 1 by mouth qid as needed pain. please,  fill on or after 04/17/2011    Oxycontin 20 Mg Tb12 (Oxycodone hcl) .Marland Kitchen... 1 tab by mouth two times a day please,  fill on or after 04/17/2011    Celebrex 200 Mg Caps (Celecoxib) .Marland Kitchen..Marland Kitchen Two times a day    Ibuprofen 600 Mg Tabs (Ibuprofen) .Marland Kitchen... 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 1 by mouth qid as needed pain. please,  fill on or after 04/17/2011 2)  Oxycontin 20 Mg Tb12 (Oxycodone hcl) .Marland Kitchen.. 1 tab by mouth two times a day please,  fill on or after 04/17/2011 3)  Cyclobenzaprine  Hcl 10 Mg Tabs (Cyclobenzaprine hcl) .... Three times a day as needed for back pain 4)  Prilosec 20 Mg Cpdr (Omeprazole) .... Once daily 5)  Celebrex 200 Mg Caps (Celecoxib) .... Two times a day 6)  Metformin Hcl 1000 Mg Tabs (Metformin hcl) .... Take 1 tablet by mouth two times a day 7)  Wellbutrin Sr 150 Mg Tb12 (Bupropion hcl) .Marland Kitchen.. 1 po bid 8)  Vitamin D3 1000 Unit Tabs (Cholecalciferol) .... Once daily 9)  Glimepiride 1 Mg Tabs (Glimepiride) .Marland Kitchen.. 1 po qd 10)  Onetouch Ultra Test Strp (Glucose blood) .Marland KitchenMarland KitchenMarland Kitchen  Use once daily to two times a day 11)  Onetouch Ultrasoft Lancets Misc (Lancets) .... Use once daily to two times a day 12)  Lorazepam 2 Mg Tabs (Lorazepam) .... 1/2 or 1 by mouth two times a day prn 13)  Lidoderm 5 % Ptch (Lidocaine) .Marland Kitchen.. 1 to 3 patches qday 14)  Fiber 625 Mg Tabs (Calcium polycarbophil) .... Take 4 daily 15)  Lomotil 2.5-0.025 Mg Tabs (Diphenoxylate-atropine) .Marland Kitchen.. 1-2 by mouth q 4-6 hours as needed 16)  Estradiol 0.5 Mg Tabs (Estradiol) .Marland Kitchen.. 1 every other day 17)  Medroxyprogesterone Acetate 2.5 Mg Tabs (Medroxyprogesterone acetate) .Marland Kitchen.. 1 every other day 18)  Ibuprofen 600 Mg Tabs (Ibuprofen) .Marland Kitchen.. 1 by mouth bid  pc x 1 wk then as needed for  pain 19)  Oramoist  .... 1 as two times a day dirrected 20)  Coreg 12.5 Mg Tabs (Carvedilol) .Marland Kitchen.. 1 by mouth bid 21)  Pilocarpine Hcl 2 % Soln (Pilocarpine hcl) .... Use up to four times a day 22)  Oxybutynin Chloride 5 Mg Tabs (Oxybutynin chloride) .Marland Kitchen.. 1 by mouth two times a day (1/2 by mouth once daily) 23)  Toviaz 8 Mg Xr24h-tab (Fesoterodine fumarate) .Marland Kitchen.. 1 by mouth once daily for bladder  Patient Instructions: 1)  Please schedule a follow-up appointment in 3 months. 2)  BMP prior to visit, ICD-9: 3)  HbgA1C prior to visit, ICD-9:250.00 Prescriptions: TOVIAZ 8 MG XR24H-TAB (FESOTERODINE FUMARATE) 1 by mouth once daily for bladder  #30 x 12   Entered and Authorized by:   Cassandria Anger MD   Signed by:   Cassandria Anger MD on 02/03/2011   Method used:   Print then Give to Patient   RxID:   [id number] OXYCONTIN 20 MG  TB12 (OXYCODONE HCL) 1 tab by mouth two times a day Please,  fill on or after 04/17/2011 Brand medically necessary #60 x 0   Entered and Authorized by:   Cassandria Anger MD   Signed by:   Cassandria Anger MD on 02/03/2011   Method used:   Print then Give to Patient   RxID:   [id number] OXYCODONE HCL 15 MG  TABS (OXYCODONE HCL) 1 by mouth qid as needed pain. Please,  fill on or after 04/17/2011  #120 x 0   Entered and Authorized by:   Cassandria Anger MD   Signed by:   Cassandria Anger MD on 02/03/2011   Method used:   Print then Give to Patient   RxID:   [id number] OXYCONTIN 20 MG  TB12 (OXYCODONE HCL) 1 tab by mouth two times a day Please,  fill on or after 03/18/2011 Brand medically necessary #60 x 0   Entered and Authorized by:   Cassandria Anger MD   Signed by:   Cassandria Anger MD on 02/03/2011   Method used:   Print then Give to Patient   RxID:   [id number] OXYCODONE HCL 15 MG  TABS (OXYCODONE HCL) 1 by mouth qid as needed pain. Please,  fill on or after 03/18/2011  #120 x 0   Entered and Authorized by:   Cassandria Anger MD   Signed by:   Cassandria Anger MD on 02/03/2011   Method used:   Print then Give to Patient   RxID:   [id number] OXYCONTIN 20 MG  TB12 (OXYCODONE HCL) 1 tab by mouth two times a day Please,  fill on or after 02/15/2011 Brand medically necessary #60 x 0  Entered and Authorized by:   Cassandria Anger MD   Signed by:   Cassandria Anger MD on 02/03/2011   Method used:   Print then Give to Patient   RxID:   [id number] OXYCODONE HCL 15 MG  TABS (OXYCODONE HCL) 1 by mouth qid as needed pain. Please,  fill on or after 02/15/2011  #120 x 0   Entered and Authorized by:   Cassandria Anger MD   Signed by:   Cassandria Anger MD on 02/03/2011   Method used:   Print then Give to Patient   RxID:    [id number]    Orders Added: 1)  Est. Patient Level IV [id number]

## 2011-03-21 LAB — GLUCOSE, CAPILLARY
Glucose-Capillary: 114 mg/dL — ABNORMAL HIGH (ref 70–99)
Glucose-Capillary: 84 mg/dL (ref 70–99)

## 2011-04-13 ENCOUNTER — Other Ambulatory Visit: Payer: Self-pay | Admitting: Internal Medicine

## 2011-04-13 NOTE — Telephone Encounter (Signed)
Ok to Rf? 

## 2011-04-14 NOTE — Telephone Encounter (Signed)
rx faxed

## 2011-04-27 NOTE — Discharge Summary (Signed)
NAMEDIANDREA, [name]                 ACCOUNT NO.:  [id number]   MEDICAL RECORD NO.:  [id number]          PATIENT TYPE:  INP   LOCATION:  [id number]                         FACILITY:  [facility]   PHYSICIAN:  Gaynelle Arabian, M.D.    DATE OF BIRTH:  09/16/1951   DATE OF ADMISSION:  02/02/2008  DATE OF DISCHARGE:  02/05/2008                               DISCHARGE SUMMARY   ADMITTING DIAGNOSIS:  1. Osteoarthritis, right knee.  2. Anxiety.  3. History of migraines.  4. Fibromyalgia.  5. History of non malignant brain tumor, status post removal.  6. Hypertension.  7. Hiatal hernia.  8. Reflux disease.  9. Urinary incontinence.  10.Non-insulin-dependent diabetes mellitus.  11.History of anemia.  12.History of right knee stress fracture.  13.Lumbar degenerative disk disease.  14.Postmenopausal.  15.Fibrocystic breast disease.  16.Mild hypercholesterolemia.  17.Varicose veins.  18.Osteoporosis.  19.Neuropathy.  20.History of childhood illnesses, including measles and mumps.   DISCHARGE DIAGNOSIS:  1. Osteoarthritis right knee status post right total knee      arthroplasty.  2. Postop blood loss anemia.  3. Mild postop hyponatremia.  4. Anxiety.  5. History of migraines.  6. Fibromyalgia.  7. History of non malignant brain tumor, status post removal.  8. Hypertension.  9. Hiatal hernia.  10.Reflux disease.  11.Urinary incontinence.  12.Non-insulin-dependent diabetes mellitus.  13.History of anemia.  14.History of right knee stress fracture.  15.Lumbar degenerative disk disease.  16.Postmenopausal.  17.Fibrocystic breast disease.  18.Mild hypercholesterolemia.  19.Varicose veins.  20.Osteoporosis.  21.Neuropathy.  22.History of childhood illnesses, including measles and mumps.   PROCEDURE:  February 02, 2008, right total knee.  Surgeon Dr. Wynelle Link,  assistant Arlee Muslim. PA-C.  Anesthesia general.  Tourniquet time 42  minutes.   CONSULTS:  None.   BRIEF HISTORY:  [name] is a  60 year old female with end-stage arthritis of  the right knee, severe valgus deformity, successful left total knee who  now presents for right total knee.   LABORATORY DATA:  Preop CBC showed a hemoglobin 11.2, hematocrit of  32.5, white cell count 9.4, platelets 252. Postop hemoglobin 9.8,  drifted down to 8.8. Last noted H&H 8.4 and 24.1.  PT and PTT preop 12.5  and 30 respectively. INR is 0.9.  Serial protimes followed.  Last noted  PT/INR 18.4 and 1.5.  Chem panel on admission elevated glucose of 228,  elevated BUN at 28. Remaining Chem panel within normal limits.  Serial  BMETs were followed.  Electrolytes showed a drop in sodium from 138 to  134, last noted at 133, BUN came down to normal level of 15.  Preop UA  negative.  Blood group type O+.   EKG March 01, 2007, normal sinus rhythm, normal EKG.   HOSPITAL COURSE:  The patient admitted to [facility],  tolerated the procedure well and later transferred to the recovery room  on the orthopedic floor, started on PCA and p.o. analgesic for pain  control following surgery.  Had a fair amount of pain on the evening of  surgery and the morning of day one. Also complained of some  lower back  pain, encouraged therapy. Started getting up out of bed weaning down off  the PC by day two. The patient was still hurting with some mild pain but  the pain was under a little bit better control, discontinued the PCA.  Hemoglobin is down to 8.8, asymptomatic.  She did very well with the  therapy up walking about 165 feet.  Dressing changed, incision looked  good, no signs of infection.  Continued to progress with walking about  240 feet the following day of February 05, 2008. The patient was  tolerating her meds, progressing with therapy and it was decided the  patient would be discharged home at that time.   DISCHARGE/PLAN:  1. The patient was discharged home on February 05, 2008.  2. Discharge diagnoses, please see above.  3.  Discharge medications:  Percocet, Robaxin, Coumadin, Nu-Iron. She      is given one dose of Lovenox prior to discharge.   FOLLOW-UP:  2 weeks.   ACTIVITY:  Weightbearing as tolerated right lower extremity.  Home  health PT and home health nursing, total knee protocol.   DISPOSITION:  Home.   CONDITION ON DISCHARGE:  Improved.      [name], P.A.C.      Gaynelle Arabian, M.D.  Electronically Signed    ALP/MEDQ  D:  03/18/2008  T:  03/18/2008  Job:  [id number]   cc:   [name], MD  [address]  [place] [address]

## 2011-04-27 NOTE — H&P (Signed)
[name], [name]                 ACCOUNT NO.:  [id number]   MEDICAL RECORD NO.:  [id number]        PATIENT TYPE:  LINP   LOCATION:                               FACILITY:  [facility]   PHYSICIAN:  Gaynelle Arabian, M.D.    DATE OF BIRTH:  24-Sep-1951   DATE OF ADMISSION:  10/04/2007  DATE OF DISCHARGE:                              HISTORY & PHYSICAL   CHIEF COMPLAINT:  Left knee pain.   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  She has  known bilateral knee arthritis and has been treated and followed for  quite some time now.  It has been progressively getting worse to the  point where she was having pain most of the time.  She has reached a  point where she would like to have something done about it.  Risks and  benefits of the procedure have been discussed with the patient.  She  elects to proceed with surgery.   ALLERGIES:  No known drug allergies.   INTOLERANCES:  Erythromycin causes nausea, vomiting and diarrhea.   CURRENT MEDICATIONS:  Metformin, lisinopril, Celebrex, Ambien,  trazodone, OxyContin, oxycodone, multivitamin, vitamin C, recent Z-Pak,  Mucinex fiber tablets, Zoloft, and omeprazole.   PAST MEDICAL HISTORY:  1. Anxiety.  2. History of migraines.  3. Fibromyalgia.  4. Nonmalignant brain tumor.  5. Hypertension.  6. Reflux disease.  7. Urinary incontinence.  8. Non-insulin-dependent diabetes mellitus.  9. Anemia.  10.History of her right knee stress fracture.  11.Lumbar degenerative disk disease.  12.Postmenopausal and fibrocystic breast disease.   PAST SURGICAL HISTORY:  1. Left knee arthroscopy.  2. Sinus surgery.  3. Septoplasty.  4. Tonsillectomy.   SOCIAL HISTORY:  Single.  Works over at [employer].  Past smoking  history for about 15-20 years, quit 1990.  Occasional intake of alcohol.  No children.  One of her friends will be assisting with care after  surgery.   REVIEW OF SYSTEMS:  GENERAL:  No fevers, chills, night sweats.  She has  had a recent cold with productive cough.  She is currently on Z-Pak and  Mucinex.  She will call us if there are any changes between now and the  time of surgery.  CARDIOVASCULAR:  No chest pain, angina or orthopnea.  GI:  She does have constipation secondary to medications.  No diarrhea,  no nausea, or vomiting.  GU: Some frequency and urgency.  No dysuria or  hematuria.  MUSCULOSKELETAL:  Left knee.   PHYSICAL EXAMINATION:  VITAL SIGNS:  Pulse 72, respirations 14, blood  pressure 104/62.  GENERAL:  A 60 year old white female, short stature, overweight, in no  acute distress.  She is alert, oriented and cooperative, pleasant,  somewhat anxious at the time of exam.  Excellent story  HEENT:  Normocephalic, atraumatic.  Pupils are round and reactive.  No  glasses.  EOMs intact.  NECK:  Supple.  CHEST:  Clear anterior posterior chest walls.  No rhonchi, rales or  wheezing.  HEART:  Regular rate and rhythm without murmur.  S1 and S2 noted.  No  rubs, thrills, palpitations, or murmurs.  ABDOMEN:  Soft, round, protuberant abdomen.  Bowel sounds present.  RECTAL/BREASTS/GENITALIA:  Not done.  Not pertinent to present illness.  EXTREMITIES:  Left knee showed marked crepitus, tender more medial and  lateral, no instability. Right knee showed marked crepitus, tender more  lateral than medial.  No instability.   IMPRESSION:  1. Osteoarthritis left knee.  2. Migraines.  3. Anxiety.  4. Fibromyalgia.  5. History of nonmalignant brain tumor.  6. Recent upper respiratory cold.  7. Hypertension.  8. Reflux disease.  9. Urinary incontinence.  10.Non-insulin-dependent diabetes mellitus.  11.Anemia.  12.History of stress fracture, right knee.  13.Lumbar degenerative disk disease.  14.Postmenopausal.  15.Fibrocystic breast disease.   PLAN:  The patient will be admitted to [facility] to undergo a   left total knee replacement arthroplasty.  Surgery will be performed by  Dr. Gaynelle Arabian.      Alexzandrew L. Perkins, P.A.C.      Gaynelle Arabian, M.D.  Electronically Signed    ALP/MEDQ  D:  10/03/2007  T:  10/04/2007  Job:  [id number]

## 2011-04-27 NOTE — H&P (Signed)
Anita, [name]                 ACCOUNT NO.:  [id number]   MEDICAL RECORD NO.:  [id number]          PATIENT TYPE:  INP   LOCATION:  1306                         FACILITY:  [facility]   PHYSICIAN:  Sandy Salaam. Deatra Ina, MD,FACGDATE OF BIRTH:  01/26/1951   DATE OF ADMISSION:  09/25/2008  DATE OF DISCHARGE:  09/30/2008                              HISTORY & PHYSICAL   HISTORY OF PRESENT ILLNESS:  [name] is a 60 year old female who  recently went to [place] for a vacation.  On the third or fourth day of  her vacation the patient developed abdominal cramps and diarrhea.  The  patient started Cipro which she had brought with her on the trip in case  it was needed for diarrhea.  The patient felt better on Cipro which she  took for 10 days.  The diarrhea then returned after completing the  course of antibiotics.  The patient used Imodium as needed for the  duration of the trip.  Diarrhea has since mainly at night.  The patient  did drink only bottled water during her stay in [place] but did eat  foods which were probably cooked or washed with local water.  She has  not had any muscle aches.  No nausea or vomiting.  She has had  intermittent chills.  Diarrhea is nonbloody.  The patient does have  patches nonraised red lesions on her hands and upper extremities but she  attributes that to the desert sun in [place].  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   The patient is apparently known to [name] in our practice and has had  a colonoscopy as well as an EGD in the distant past.  I am unable to  immediately obtain those records.   PAST MEDICAL HISTORY:  1. Diabetes.  2. GERD.  3. Fibromyalgia.  4. Hypertension.  5. Anxiety.  6. Arthritis.  7. Chronic low back pain.  8. History of knee replacement, bilateral.  9. History of nonmalignant foramen magnum tumor.   MEDICATIONS:  Home medications include:  1. Lisinopril 40 mg daily.  2. Glucophage 1000 mg b.i.d.  3. Prilosec 20 mg daily.  4. Caltrate.  5. Vitamin D3.  6. Zinc.  7. Glimepiride 1 mg daily.  8. Ativan 2 mg at night.  9. Vitamin C.  10.Coq10.  11.Ibuprofen as needed.  12.Fish oil.  13.Flexeril 10 mg t.i.d. as needed.  14.Celebrex as needed.  15.Tizanidine 4 mg 1 to 2 tablets a day.   ALLERGIES:  ERYTHROMYCIN.   FAMILY HISTORY:  No colorectal cancer.  No liver diseases in the family.   SOCIAL HISTORY:  Former smoker, quit 1990s.  Rare alcohol intake.  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   REVIEW OF SYSTEMS:  Review of systems positive for chills and weakness.  Positive for diarrhea and abdominal cramps.  See history of present  illness.  Positive for patchy areas of erythema to the hands and left  upper  extremity.  All other review of systems reviewed and negative  other than noted in the HPI.   PHYSICAL EXAMINATION:  VITAL SIGNS:  Temperature 100.  Blood pressure  129/81, heart rate 115, respirations 20, oxygen saturation  92% on room  air.  GENERAL:  [name] is a pleasant 60 year old female lying in bed in  no acute distress.  HEENT:  No scleral icterus.  Conjunctivae pink.  No oral lesions.  The  patient's tongue is dry from dehydration.  NECK:  Supple.  No palpable  lymphadenopathy.  CARDIAC:  Slightly tachycardic.  Regular rate and rhythm.  No  appreciable murmurs.  RESPIRATORY:  Bilateral lung fields clear.  ABDOMEN:  Soft, obese, mild right lower quadrant tenderness and  epigastric tenderness to palpation.  No obvious masses but difficult  exam secondary to body habitus.  EXTREMITIES:  No palmar erythema.  Erythematous, nonraised red lesions  to both hands and on the left upper extremity.  NEUROLOGIC:  Alert and oriented.  PSYCHOLOGICAL:  Pleasant and cooperative.   LABORATORY DATA:  Laboratory studies pending.  X-rays:  None yet  ordered.   IMPRESSION:  1. Acute diarrhea with  recent travel to [place].  Initially      responded to 10 days of Cipro but relapsed while still in [place].      Likely, infectious etiology.  2. Gastroesophageal reflux disease, on chronic proton pump inhibitor.  3. Diabetes, on oral agents.  4. Hypertension,  5. fibromyalgia/chronic back pain, on chronic narcotics.  6. History of anxiety, on Ativan.  7. Arthritis.  Takes Motrin but not in excessive amounts.   PLAN:  1. Will admit for volume repletion, lab studies, stool studies.  2. Xiflaxin and Flagyl.  3. Accu-Chek sliding scale insulin coverage.  Hold oral diabetic      agent.  4. Proton pump inhibitor.  5. Continue home pain medications and home Ativan.      Tye Savoy, NP      Sandy Salaam. Deatra Ina, MD,FACG  Electronically Signed    PG/MEDQ  D:  10/03/2008  T:  10/03/2008  Job:  [id number]

## 2011-04-27 NOTE — Discharge Summary (Signed)
[name], [name]                 ACCOUNT NO.:  [id number]   MEDICAL RECORD NO.:  [id number]          PATIENT TYPE:  INP   LOCATION:  1306                         FACILITY:  [facility]   PHYSICIAN:  Sandy Salaam. Deatra Ina, MD,FACGDATE OF BIRTH:  Dec 07, 1951   DATE OF ADMISSION:  09/25/2008  DATE OF DISCHARGE:  09/30/2008                               DISCHARGE SUMMARY   DISCHARGE DIAGNOSES:  1. Infectious enterocolitis.  Terminal ileum.  Biopsies revealed mild      active ileitis.  Colon biopsy showed severe active mucosal colitis.  2. Adenomatous colon polyps.  3. Acute kidney injury, resolved by the time of discharge.   DISCHARGE MEDICATIONS:  1. Lisinopril 40 mg daily.  2. Glucophage 1000 mg b.i.d.  3. Ativan 2 mg at bedtime.  4. Omeprazole 20 mg 2 in the morning, 1 in the evening.  5. Tizanidine 4 mg 1-2 times a day.  6. MS Contin 15 mg twice daily.  7. Omeprazole 20 mg 2 in the morning, 1 in the evening.  8. Flagyl 250 mg p.o. 4 times a day.  9. Xifaxan 400 mg twice a day for a total of 10 days.  (The patient      had received 4-5 days of Xifaxan in the hospital until prescription      finished, total of 10 days).  10.Robinul Forte 10 mg 1-2 tablets every 4-6 hours as needed.  11.Florastor 1 tablet twice a day for 2 weeks.   CONSULTATIONS:  None requested this admission.   PROCEDURES:  Colonoscopy with polypectomy and random colon biopsies.   HOSPITAL COURSE:  [name] was admitted September 25, 2008 for diarrhea  and dehydration.  The patient had been on vacation in [place] when she  developed the diarrhea on the 3rd or 4th day of her vacation.  The  patient took Cipro which she brought with her just in case it was  needed.  While on Cipro for several days the patient's diarrhea  resolved, but upon finishing the course of antibiotics the patient's  diarrhea returned.  She used Imodium for the remainder of her vacation.  The patient is a nurse here in the endoscopy lab.  She was  unable to  work secondary to profuse diarrhea.  The patient was admitted for volume  repletion and further evaluation.  On admission the patient's blood  pressure was stable at 129/81, but she was tachycardic with a heart rate  of 115 and had a low grade fever of 100.   ADMISSION LABORATORIES:  Labs were significant for a BUN of 56 and a  creatinine of 1.67.  The patient's CBC was normal.  The patient was  volume repleted, and the next day her BUN was down to 33, creatinine  down to 1.02.  Her renal function normalized on the 2nd day of  admission.  Stool studies including O&P and 3 C. difficile studies were  negative.  Stool culture was negative as well.  Fecal lactoferrin was  positive.  Because of continued diarrhea despite antibiotics, the  patient was prepped and scheduled for a colonoscopy.  The patient had  mucosal abnormalities from the ileum to the rectum.  There was mild  friability and erythema present.  Additionally the patient had a 10 mm  sessile polyp removed in the sigmoid colon at the time of procedure.  Pathology as described in the discharge diagnoses.  By the 3rd day of  admission, the patient was feeling much better with less diarrhea.  On  the 4th day of admission her diet was advanced to a soft low residue  diet.  On September 30, 2008 the patient showed further improvement, and  was discharged home in stable condition on above medications.  The  patient was given an appointment to follow up with [name], her  primary gastroenterologist, November the 25th at 9:30 a.m.  She was to  continue a low residue diabetic diet.  The patient was given enough  Flagyl and Xifaxan to complete a full 10-day course of the antibiotics.      Tye Savoy, NP      Sandy Salaam. Deatra Ina, MD,FACG  Electronically Signed    PG/MEDQ  D:  10/15/2008  T:  10/16/2008  Job:  [id number]

## 2011-04-27 NOTE — Op Note (Signed)
NAMEABIE, GROH                 ACCOUNT NO.:  [id number]   MEDICAL RECORD NO.:  [id number]          PATIENT TYPE:  INP   LOCATION:  1621                         [facility]ACILITY:  [facility]   PHYSICIAN:  Gaynelle Arabian, M.D.    DATE OF BIRTH:  [birth year]   DATE OF PROCEDURE:  10/04/2007  DATE OF DISCHARGE:                               OPERATIVE REPORT   PREOPERATIVE DIAGNOSIS:  Osteoarthritis, left knee, with valgus  deformity.   POSTOPERATIVE DIAGNOSIS:  Osteoarthritis, left knee, with valgus  deformity.   PROCEDURE:  Left total knee arthroplasty.   SURGEON:  Gaynelle Arabian, M.D.   ASSISTANT:  Alexzandrew L. Perkins, P.A.C.   ANESTHESIA:  General with postoperative Marcaine pain pump.   ESTIMATED BLOOD LOSS:  Minimal.   DRAINS:  Hemovac x1.   TOURNIQUET TIME:  37 minutes at 300 mmHg.   COMPLICATIONS:  None.   CONDITION:  Stable to recovery.   CLINICAL NOTE:  [name] is a 60 year old female with end stage arthritis  of both knees, left more symptomatic than the right.  She has failed  nonoperative management including injection and presents for total knee  arthroplasty.   PROCEDURE IN DETAIL:  After the successful administration of a general  anesthetic, a tourniquet is placed high on the left thigh and the left  lower extremity prepped and draped in the usual sterile fashion.  The  extremity was wrapped in an Esmarch, knee flexed, and the tourniquet  inflated 300 mmHg.  A midline incision was made with a 10 blade through  the subcutaneous tissue to the level of the extensor mechanism.  A fresh  blade is used to make a lateral parapatellar arthrotomy given her valgus  deformity.  The soft tissue over the proximal lateral tibia is elevated  around the joint line to the posterolateral corner but not including the  structures of the posterolateral corner.  The soft tissue medially is  intact.  The patella was everted medially, knee flexed to 90 degrees,  and the ACL and PCL  removed.   A drill was used to create a starting hole in the distal femur and the  canal was thoroughly irrigated.  A 5 degrees left valgus alignment guide  is placed and referencing off the posterior condyles, rotation is marked  and the block pinned to remove 10 mm off the distal femur.  Distal  femoral resection is made with an oscillating saw.  Sizing block was  placed, size 3 is the most appropriate.  Rotation is marked off the  epicondylar axis.  Size 3 cutting block is placed and the anterior,  posterior, and chamfer cuts made.   The tibia was subluxed forward and the menisci removed.  The  extramedullary tibial alignment guide is placed referencing proximally  at the medial aspect of tibial tubercle and distally along the second  metatarsal axis and tibial crest.  The block is pinned to remove about 4  mm off the more deficient lateral side.  Tibial resection is made with  an oscillating saw.  Size 3 is the most appropriate tibial component and  the proximal tibia prepared with the modular drill and keel punch for a  size 3.  Femoral preparation is completed with the intercondylar cut.   A size 3 mobile bearing tibial trial, size 3 posterior stabilized  femoral trial, and a 10 mm posterior stabilized rotating platform insert  trial are placed. With the 10, full extension is achieved with excellent  varus and valgus balance throughout full range of motion.  The patella  was everted, thickness measured to be 21 mm.  Freehand resection was  taken to 12 mm, 38 template is placed, lug holes were drilled, trial  patella was placed and it tracks normally.  The osteophytes were removed  from the posterior femur with the trial in place.  All trials were  removed and the cut bone surfaces prepared with pulsatile lavage.  The  cement was mixed and once ready for implantation, the size 3 mobile  bearing tibial tray, size 3 posterior stabilized femur, and 38 patella  are cemented in place.   The patella was held with a clamp.  A trial 10  mm insert was placed, knee held in full extension, and all extruded  cement removed.  Once the cement had fully hardened, then the permanent  10 mm posterior stabilized rotating platform insert is placed in the  tibial tray.  The wound was copiously irrigated with saline solution  with the pulsatile lavage.  FloSeal was injected on the posterior  capsule and medial and lateral gutters.  The tourniquet was released  with a total time of 37 minutes.  Minor bleeding was stopped with  cautery.  A Hemovac drain is placed and then the arthrotomy was closed  with interrupted #1 PDS leaving open a small area from the superior to  inferior pole of the patella to serve as a partial lateral release.  The  patella tracks normally in flexion against gravity to 130 degrees.  The  subcu was closed with interrupted 2-0 Vicryl and subcuticular running 4-  0 Monocryl.  The incisions were cleaned and dried and Steri-Strips and a  bulky sterile dressing applied.  Prior to this, the catheter for the  Marcaine pain pump was placed and the pump was initiated.  She was then  placed into a knee immobilizer, awakened, and transferred to recovery in  stable condition.      Gaynelle Arabian, M.D.  Electronically Signed     FA/MEDQ  D:  10/04/2007  T:  10/04/2007  Job:  [id number]

## 2011-04-27 NOTE — Op Note (Signed)
[name]                 ACCOUNT NO.:  [id number]   MEDICAL RECORD NO.:  [id number]          PATIENT TYPE:  INP   LOCATION:  1605                         FACILITY:  [facility]   PHYSICIAN:  Gaynelle Arabian, M.D.    DATE OF BIRTH:  1951/07/20   DATE OF PROCEDURE:  02/02/2008  DATE OF DISCHARGE:                               OPERATIVE REPORT   PREOPERATIVE DIAGNOSIS:  Osteoarthritis right knee.   POSTOPERATIVE DIAGNOSIS:  Osteoarthritis right knee.   PROCEDURE:  Right total knee arthroplasty.   SURGEON:  Gaynelle Arabian, M.D.   ASSISTANT:  Alexzandrew L. Perkins, P.A.C.   ANESTHESIA:  General with postop Marcaine pain pump.   ESTIMATED BLOOD LOSS:  Minimal.   DRAINS:  Hemovac x1.   TOURNIQUET TIME:  42 minutes at 300 mmHg.   COMPLICATIONS:  None.   CONDITION:  Stable to the recovery room.   BRIEF CLINICAL NOTE:  [name] is a 60 year old female with end stage  arthritis of the right knee with severe valgus deformity.  She recently  had a successful left total knee arthroplasty and presents now for right  total knee arthroplasty.   PROCEDURE IN DETAIL:  After the successful administration of a general  anesthetic, a tourniquet was placed high on her right thigh and her  right lower extremity prepped and draped in the usual sterile fashion.  The extremity was wrapped in an Esmarch, knee flexed, and the tourniquet  inflated to 300 mmHg.  A midline incision was made with a 10 blade  through the subcutaneous tissue.  A lateral parapatellar arthrotomy was  made since this is a significant valgus deformity.  The soft tissue of  the proximal lateral tibia is then subperiosteally elevated to the joint  line with the knife and around the lateral side of the joint but not  including the structures of the posterolateral corner.  The soft tissue  medially is elevated with attention being paid to avoid the patellar  tendon on the tibial tubercle.  The patella was everted medially and  the  knee flexed 90 degrees, ACL and PCL were removed.  A drill was used to  create a starting hole in the distal femur.  The canal was thoroughly  irrigated.  The 5 degrees right valgus alignment guide is placed  referencing off the posterior condyles. Rotation is marked and a block  pinned to remove 10 mm off the distal femur.  Distal femoral resection  is made with an oscillating saw.  Size 2.5 is the most appropriate  femoral component and the rotation is marked off the epicondylar axis.  A size 2.5 cutting block is placed and the anterior, posterior, and  chamfer cuts are made.   The tibia is subluxed forward and the menisci are removed.  The  extramedullary tibial alignment guide is placed referencing proximally  at the medial aspect of the tibial tubercle and distally along the  second metatarsal axis of the tibial crest.  A block is pinned to remove  about 4 mm off the more deficient lateral side.  Tibial resection is  made  with an oscillating saw.  Size 2.5 is the most appropriate tibial  component and the proximal tibia is prepared with the modular drill and  keel punch for a size 2.5.  Femoral preparation is completed with the  intercondylar cut.   A size 2.5 mobile bearing tibial trial, 2.5 posterior stabilized femoral  trial, and a 10 mm posterior stabilized rotating platform insert trial  is placed. With the 10, there is a tiny bit of hyperextension, the 12.5  allows for full extension with excellent varus valgus, anterior and  posterior balance throughout the full range of motion.  The patella was  everted and thickness measured to be 22 mm.  Freehand resection is taken  down to 12 mm, a 38 template is placed, lug holes were drilled, trial  patella is placed, and it tracks normally.  Osteophytes are removed from  the posterior femur with the trial in place.  All trials are removed and  the cut bone surfaces are prepared with pulsatile lavage.  The cement  was mixed and  once ready for implantation, the size 2.5 mobile bearing  tibial tray, 2.5 posterior stabilized femur, and 38 patella are cemented  into place and the patella is held with a clamp.  A trial 12.5 mm  posterior stabilized rotating platform insert is placed into the tibial  tray.  The knee is held in full extension and all extruded cement is  removed.  Once the cement had fully hardened, then the trial was removed  and the wound copiously irrigated with saline solution.  FloSeal was  injected on the posterior capsule.  The permanent 12.5 mm posterior  stabilized rotating platform insert is placed in the tibial tray.  FloSeal then placed in the medial and lateral gutters and suprapatellar  area.  A moist sponge is placed and the tourniquet released with a total  time of 42 minutes.  The sponge is held for two minutes and then removed  and minimal bleeding is encountered.  The bleeding that was encountered  was stopped with electrocautery.   We then further irrigated and closed the arthrotomy with interrupted #1  PDS.  I left open a tiny area from the superior to inferior pole of the  patella.  A Hemovac drain was then placed into the joint.  The subcu was  closed with interrupted 2-0 Vicryl and flexion against gravity to 135  degrees.  The subcuticular was closed with a running 4-0 Monocryl.  The  catheter for the Marcaine pain pump is placed and the pump is initiated.  Steri-Strips and a bulky sterile dressing was applied and she is  awakened and transferred to the recovery room in stable condition.      Gaynelle Arabian, M.D.  Electronically Signed     FA/MEDQ  D:  02/02/2008  T:  02/03/2008  Job:  [id number]

## 2011-04-27 NOTE — H&P (Signed)
[name], [name]                 ACCOUNT NO.:  [id number]   MEDICAL RECORD NO.:  [id number]          PATIENT TYPE:  INP   LOCATION:  [place]                         FACILITY:  [facility]   PHYSICIAN:  Gaynelle Arabian, M.D.    DATE OF BIRTH:  Mar 05, 1951   DATE OF ADMISSION:  02/02/2008  DATE OF DISCHARGE:                              HISTORY & PHYSICAL   DATE OF OFFICE VISIT HISTORY AND PHYSICAL:  January 18, 2008.   CHIEF COMPLAINT:  Right knee pain.   HISTORY OF PRESENT ILLNESS:  The patient is a 60 year old female, well-  known to Dr. Pilar Plate Aluisio for ongoing right knee pain.  She is well-  known to have previously  undergone a left total knee back in October  2008, doing extremely well with her left knee and very pleased with it.  She was known to have bilateral knee arthritis.  The left knee is taken  care of, and now she would like to proceed with surgery for the right  knee.  Risks and benefits discussed.  The patient was subsequently  admitted to the hospital.   ALLERGIES:  ERYTHROMYCIN CAUSES NAUSEA, VOMITING AND DIARRHEA.   CURRENT MEDICATIONS:  Lisinopril 40 mg daily, metformin 1000 mg p.o.  q.a.m. and p.m., omeprazole 20 mg q.a.m., Detrol LA 4 mg in the a.m.,  Celebrex 200 mg twice a day, Wellbutrin SR 150 mg p.o. a.m. and p.m.,  Ambien 10 mg q.h.s., tizanidine 4 mg 2 at bedtime, cyclobenzaprine 10 to  30 mg p.r.n., lorazepam 1 mg p.o. p.r.n., and Fiber Tabs.   PAST MEDICAL HISTORY:  Anxiety, history of migraines, fibromyalgia,  nonmalignant brain tumor, hypertension, hiatal hernia, reflux disease,  urinary incontinence, non-insulin-dependent diabetes mellitus, anemia,  history of right knee stress fracture, lumbar degenerative disk disease,  postmenopausal, fibrocystic breast disease, mild hypercholesterolemia,  varicose veins, osteoporosis, history of neuropathy in toes and also  childhood illnesses to include measles and mumps.   SURGICAL HISTORY:  Brain surgery for  nonmalignant tumor, left total knee  in October 2008,  previous left knee arthroscopy, sinus surgery,  septoplasty, tonsillectomy.   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  Past smoking a pack per day for about 5 to 6 years,  quit approximately 1990.  Occasional intake of alcohol socially.  No  children.  A friend will be helping her with care after surgery.   FAMILY HISTORY:  Father deceased at age 82 with diabetes and heart  disease.  Mother living, age 71, degenerative joint disease,  osteoporosis, dementia.  Sister 66 with osteoporosis and brother 33 with  heart disease, 2 MIs, 2 strokes, and also insulin-dependent diabetes  mellitus.  Brother age 42 with diabetes, obesity, chronic anemia.  Sister 53 with multiple orthopedic issues.   REVIEW OF SYSTEMS:  GENERAL:  No fevers, chills, night sweats.  NEUROLOGIC:  No seizures, syncope or paralysis.  She does have history  of headaches.  RESPIRATORY:  No shortness breath, productive cough or  hemoptysis.  CARDIOVASCULAR:  No chest pain, orthopnea.  GI: Occasional  constipation and heartburn.  No nausea, vomiting, diarrhea.  GU:  A  little bit of frequency and also some incontinence.  No dysuria or  hematuria.  MUSCULOSKELETAL:  Joint pain and swelling and morning  stiffness with her knee.   PHYSICAL EXAMINATION:  VITAL SIGNS:  Pulse 96, respiration 14, blood  pressure 120/62.  GENERAL:  The patient is a 60 year old white female, well-nourished,  well-developed, short stature, overweight, in no acute distress.  She is  alert, oriented and cooperative, very pleasant.  HEENT:  Normocephalic,  atraumatic.  Pupils are round and reactive.  Notable for glasses.  EOMs  intact.  NECK:  Supple.  CHEST:  Clear anterior and posterior chest with no rhonchi or rales.  HEART:  Regular rate and rhythm.  No murmur.  ABDOMEN:  Soft, round.  Bowel sounds are present.  BREASTS/GENITALIA:  Not done, not  pertinent to present illness.  EXTREMITIES:  Right knee shows marked crepitus, passive range of motion.  Tender more lateral than medial.  No instability.   IMPRESSION:  1. Osteoarthritis, right knee.  2. Anxiety.  3. History of migraines.  4. Fibromyalgia.  5. History of nonmalignant brain tumor, status post removal.  6. Hypertension.  7. Hiatal hernia.  8. Reflux disease.  9. Urinary incontinence.  10.Non-insulin-dependent diabetes mellitus.  11.History of anemia.  12.History of right knee stress fracture.  13.Lumbar degenerative disk disease.  14.Postmenopausal.  15.Fibrocystic breast disease.  16.Mild hypercholesterolemia.  17.Varicose veins.  18.Osteoporosis.  19.Neuropathy in toes.  20.History of childhood illnesses, including measles and mumps.   PLAN:  The patient admitted to [facility] to undergo a right  total knee replacement arthroplasty.  Surgery will be performed by Dr.  Gaynelle Arabian.      Alexzandrew L. Perkins, P.A.C.      Gaynelle Arabian, M.D.  Electronically Signed    ALP/MEDQ  D:  02/02/2008  T:  02/02/2008  Job:  [id number]   cc:   Gaynelle Arabian, M.D.  Fax: Thomas Plotnikov, MD  [address]  [place] [address]

## 2011-04-27 NOTE — Discharge Summary (Signed)
NAMECHRISTYN, [name]                 ACCOUNT NO.:  [id number]   MEDICAL RECORD NO.:  [id number]          PATIENT TYPE:  INP   LOCATION:  1621                         FACILITY:  [facility]   PHYSICIAN:  [name], M.D.    DATE OF BIRTH:  1951/02/26   DATE OF ADMISSION:  10/04/2007  DATE OF DISCHARGE:  10/08/2007                               DISCHARGE SUMMARY   ADMISSION DIAGNOSES:  1. Osteoarthritis left knee.  2. Migraines.  3. Anxiety.  4. Fibromyalgia.  5. History of nonmelanotic brain tumor.  6. Recent respiratory cold.  7. Hypertension.  8. Reflux disease.  9. Urinary incontinence.  10.Non-insulin-dependent diabetes mellitus.  11.Anemia.  12.History of stress fracture right knee.  13.Lumbar degenerative disk disease.  14.Postmenopausal.  15.Fibrocystic breast disease.   DISCHARGE DIAGNOSIS:  1. Osteoarthritis left knee status post left total knee arthroplasty.  2. Acute blood loss anemia.  3. Status post transfusion without sequelae.  4. Migraines.  5. Anxiety.  6. Fibromyalgia.  7. History of nonmelanotic brain tumor.  8. Recent respiratory cold.  9. Hypertension.  10.Reflux disease.  11.Urinary incontinence.  12.Non-insulin-dependent diabetes mellitus.  13.Anemia.  14.History of stress fracture right knee.  15.Lumbar degenerative disk disease.  16.Postmenopausal.  17.Fibrocystic breast disease.   PROCEDURE:  October 04, 2007 left total knee.  Surgeon Dr. Wynelle [name],  assisted Anita Muslim PA-C.  General anesthesia.   CONSULTS:  None.   BRIEF HISTORY:  This is a 60 year old female with end-stage arthritis of  both knees, left more symptomatic than right, admitted to clinic  __________ now presents for total knee arthroplasty.   LABORATORY DATA:  Preop CBC showed hemoglobin of 11.4, hematocrit 33.2,  white cell count 9.2, red cell count 3.6.  Chem panel on admission:  Elevated glucose 215, elevated BUN of 33.  Remaining Chem panel within  normal limits.   PT/INR preop 13.0, 1.0, PTT of 31.  Preop UA negative.  Chest x-ray September 27, 2007, no acute cardiopulmonary disease.  Degenerative changes of thoracic spine with exaggerated kyphoscoliosis.  EKG dated March 01, 2007, normal sinus rhythm, normal EKG confirmed Dr.  Alain [name].  Serial CBCs were as follows:  Hemoglobin dropped down to 8.9  and then 7.3, given 2 units of blood, post transfusion hemoglobin came  back up to 9.6.  Electrolytes remained within normal limits.  BUN came  back down to a normal level of 11.   HOSPITAL COURSE:  The patient was admitted to [facility],  tolerated procedure well.  Later transferred to the recovery room on  orthopedic floor, started on PCA and p.o. analgesics, given 24 hours  postop IV antibiotics.  Started on Coumadin for DVT prophylaxis.  Actually doing pretty well on the morning of day #1, had some pain in  the evening of surgery.  Encouraged p.o. medications.  Had a little bit  elevated BUN preoperatively.  She was on lisinopril which was started  with parameters.  She had excellent output on the morning of day #1,  started getting up out of bed.  By day #2, weaned over p.m. medications  and discontinue the PCA.  Unfortunately, hemoglobin dropped down to 7.3.  We gave her 2 units of blood, tolerated the blood well.  Hemoglobin came  back up over the weekend 9.61 once she got blood.  She was doing a  little bit better with therapy.  Started to progress well.  She stayed  in the hospital on Saturday and Sunday, and on postop day #4, she was  tolerating medications, progressing well and was discharged home.   DISCHARGE PLAN:  1. Discharged home on October 08, 2007.  2. Discharge diagnoses, please see above.  3. Discharge medications:  Percocet, Flexeril, Nu-Iron, Coumadin.  4. Diet as tolerated, low sodium cardiac diabetic diet.  5. Activity:  Weightbearing as tolerated left lower extremity.  Home      health PT, home health nursing,  follow-up 2 weeks.   DISPOSITION:  Home.   CONDITION ON DISCHARGE:  Improving.      [name], P.A.C.      [name], M.D.  Electronically Signed   ALP/MEDQ  D:  11/28/2007  T:  11/28/2007  Job:  [id number]   cc:   [name], M.D.  Fax: [phone]

## 2011-04-30 NOTE — Consult Note (Signed)
[facility]  Patient:    [name] Visit Number: [id number] MRN: [id number]          Service Type: PMG Location: [facility] Attending Physician:  Cloria Spring Dictated by:   Cloria Spring, D.O. Proc. Date: 07/05/02 Admit Date:  07/04/2002   CC:         [name], M.D. [facility]   Consultation Report  Dear Dr. Alain [name]:  Thank you very much for kindly referring [name].  Please refer to the following for details regarding the history, physical examination, and treatment recommendations.  Once again, thank you for allowing [place] to participate in the care of [name].  CHIEF COMPLAINT:  Pain in right and left hip and generalized fibromyalgia pain.  HISTORY OF PRESENT ILLNESS:  [name] is a pleasant 60 year old right hand dominant female who works as a Engineer, agricultural at [employer] who has a long history of fibromyalgia syndrome which is typically well controlled with current exercise program.  She also has a history of low back pain stating that she has been diagnosed with degenerative disk disease in lumbar spine. At the beginning of July she had a flare-up of her low back pain which localized mainly into her right buttock with associated dull achy pain in her right proximal thigh diffusely.  Since that time her symptoms have essentially resolved.  Her pain today is a 3/10 on a subjective scale and is manageable. I reviewed the MRI of the lumbar spine which reveals diffuse degenerative disk changes with annular bulging at all of the lumbar disks without any focal protrusion or herniation.  There is some mild lateral recess stenosis at L4-5. The patient denies any bowel and bladder dysfunction, fever, chills, night sweats, or unexplained weight loss.  She denies lower extremity weakness. Admits to chronic mild decreased sensation in the right big toe.  Her pain  has been described as dull, achy, sharp, and at times constant.  Symptoms are worse with sitting and working and improved with medications, ice, and exercise.  Function and quality of life indexes have improved since her flare-up.  Her sleep is good with Ambien.  CURRENT MEDICATIONS: 1. Vioxx 25 mg daily. 2. Occasional oxycodone at night after work and exercise. 3. She also takes Flexeril as needed. 4. For sleep she takes Ambien and tizanidine.  I review health and history form and 14 point review of systems.  PAST MEDICAL HISTORY:  Fibromyalgia syndrome, diabetes, hypertension.  PAST SURGICAL HISTORY:  Sinus surgery, septoplasty, tonsillectomy, adenoidectomy.  FAMILY HISTORY:  Heart disease, diabetes, disability, hypertension.  SOCIAL HISTORY:  Denies smoking.  Admits to occasional alcohol use.  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  She has not missed a significant amount of work secondary to her pain problems.  ALLERGIES:  No known drug allergies, but poor tolerance to ERYTHROMYCIN which causes diarrhea.  MEDICATIONS:  Omeprazole, Vioxx, Paxil, lisinopril, Glucophage, tizanidine, Ambien, Citracal, fiber tablets, vitamin E, zinc, chromium, vitamin C, oxycodone as needed, cyclobenzaprine as needed.  PHYSICAL EXAMINATION  GENERAL:  Healthy female in no acute distress.  VITAL SIGNS:  Blood pressure 156/79, pulse 81, respirations 16, O2 saturation 96% on room air.  NEUROLOGIC:  Gait is normal.  Manual muscle testing is 5/5 bilateral lower extremities in all muscle groups tested.  Sensory examination is intact to light touch bilateral lower extremities with  the exception of mild decreased light touch in the right big toe.  Muscle stretch reflexes are 2+/4 bilateral patellar, medial hamstrings, and Achilles.  Straight leg raise is negative bilaterally.  FABER is negative on the left, equivocal on the right reproducing pain to a mild degree in  the right buttock.  BACK:  Examination of the back reveals level pelvis without scoliosis.  There is increased lumbar lordosis.  Range of motion is full in all planes with minimal discomfort.  Palpatory examination reveals mild tenderness to palpation in the right sacroiliac joint region.  There is also diffuse mild tenderness in the gluteal muscles bilaterally and upper back parascapular muscles as well as the thoracolumbar paraspinous muscles.  EXTREMITIES:  No heat, erythema, or edema in the lower extremities.  Good flexibility of hamstrings and hip flexors bilaterally.  IMPRESSION: 1. 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  Recent exacerbation    has essentially resolved. 2. Cannot conclusively rule out right sacroiliac joint contribution to current    pain. 3. Fibromyalgia syndrome with fair to good control.  RECOMMENDATIONS: 1. I had a long discussion with [name] regarding further treatment    options.  At this point it is reasonable to continue with her current    exercise program.  I have instructed her to add lumbar stabilization    exercises to include quadruped, pelvic tilt, and briding and described    these exercises. 2. Continue current medications per primary care provider.  Consider    increasing Vioxx to 50 mg during exacerbations of back pain for    approximately five days and then back down to 25 mg daily. 3. If low back pain is persistent with radicular component would consider    trial of lumbar epidural steroid injections.  Would also consider a    sacroiliac joint injection if examination is consistent with sacroiliac    joint dysfunction. 4. The patient is to return to clinic as needed.  Follow up with primary care    provider.  The patient was educated on the above findings and  recommendations and understands.  No barriers to communication.  Dictated by:   Cloria Spring, D.O. Attending Physician:  Cloria Spring DD:  07/05/02 TD:  07/09/02 Job: [id number]

## 2011-04-30 NOTE — Op Note (Signed)
NAMEANNIS, TETZLAFF                           ACCOUNT NO.:  [id number]   MEDICAL RECORD NO.:  [id number]                   PATIENT TYPE:  INP   LOCATION:  3172                                 FACILITY:  [facility]   PHYSICIAN:  Hosie Spangle, M.D.            DATE OF BIRTH:  04/25/1951   DATE OF PROCEDURE:  11/26/2002  DATE OF DISCHARGE:                                 OPERATIVE REPORT   PREOPERATIVE DIAGNOSIS:  Foramen magnum tumor.   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   PROCEDURE:  Suboccipital craniectomy and C1 and partial CO2 laminectomy and  gross total resection of tumor with duraplasty.   SURGEON:  Hosie Spangle, M.D.   ASSISTANT:  Elizabeth Sauer, M.D.   ANESTHESIA:  General endotracheal anesthesia.   INDICATIONS:  The patient is a 60 year old woman who was found on an MRI of  her cervical spine which was done for evaluation of neck and cervical  radicular pain to have an incidental tumor  at the level of foramen magnum.  A previous MRI of  the brain had been done in July of 2002 (16 months  earlier) and in retrospect, it did reveal tumor. There has been limited  interval growth. The decision was made to proceed with exposure and  resection of the tumor.   DESCRIPTION OF PROCEDURE:  The patient was brought to the operating room and  placed under general endotracheal anesthesia. A thee pin Mayfield head  holder was applied and the patient was  turned to the prone position. The  suboccipital region was shaved, and then the suboccipital and cervical  region were prepped with Betadine soap solution in a sterile fashion.   The midline at the cranial cervical junction was instilled with  local anesthetic with epinephrine, and then a midline incision was made and  carried down through the subcutaneous tissue. Bipolar cautery and  electrocautery was used to maintain hemostasis. Dissection was carried down  to the occipital bone and the musculature was elevated from the occipital  down to the level of the foramen magnum. The spinous process and lamina of  C2 as well as the posterior  ring of C1 were exposed, and self-retaining  retractors were placed.   We then proceeded with a suboccipital craniectomy using the [facility] Max drill,  double action rongeurs and Kerrison punches. A C1 laminectomy as well as a  partial C2 laminectomy was performed using the [facility] Max drill, double  action rongeurs and Kerrison punches. Hemostasis was maintained with the use  of bipolar cautery as well as Gelfoam soaked in thrombin.   The microscope was draped and brought into the field to provide instant  magnification and ______ visualization, and  the remainder of the procedure  was performed using microdissection and microsurgical technique. A  dural  stitch was placed inferiorly at the level of the midline at C2. The dura was  tented up and carefully opened, leaving  the underlying structures  undisturbed.   We then used a Location manager to help guide the dural opening, which was  carried to just rostral to the foramen magnum at which point it was split in  a Y-shaped fashion. The tumor was immediately identified with overlying  arachnoid. Dural tenting sutures were placed and we began to open the  overlying arachnoid and mobilize the tumor. This was done with sharp  dissection as feasible.   The tumor was examined. It appeared to originate from the caudal most aspect  of the floor of the fourth ventricle. The tumor was amputated and  approximately 90% of the tumor was sent to pathology for both frozen and  permanent section. Dr. 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 Certainly a final  diagnosis awaits permanent sections.   The remainder of the tumor was debulked using gentle suction aspiration and  bipolar cautery, until a gross total resection of tumor was achieved. In the  end, all visible tumor had been removed. Good hemostasis was achieved. The  resection site was irrigated gently with saline solution.   Once hemostasis was once again confirmed, we proceeded with closure,  initially performing a duraplasty using a 6 x 8 sheet of Duraguard which was  cut and shaped to size and sutured in place with interrupted and running 6-0  Prolene sutures. A good water tight closure was achieved and tested with a  Valsalva to a pressure of 40 mmHg. Then the exposure was closed with  multiple layers of  undyed 0 Vicryl sutures, closing the muscle and fascia  layers with 2-0 undyed Vicryl sutures placed in an inverted  fashion,  closing the superficial fascia and subcutaneous layer. The skin layers were  being closed with surgical staples. The wound was  dressed with Adaptic and  sterile gauze and Hypafix.   The patient tolerated the procedure well. The estimated blood loss was 100  cc. Sponge and instrument counts were correct. Following the surgery the  patient was  turned back to the supine  position. The three pin Mayfield  head holder was removed. The patient is to be reversed from the anesthetic,  extubated and transferred to the recovery room to be subsequently  transferred to the  neurosurgery the intensive care unit for further care.                                               Hosie Spangle, M.D.    RWN/MEDQ  D:  11/26/2002  T:  11/26/2002  Job:  [id number]

## 2011-04-30 NOTE — H&P (Signed)
NAMEDIONISIA, PORRES                           ACCOUNT NO.:  [id number]   MEDICAL RECORD NO.:  [id number]                   PATIENT TYPE:  INP   LOCATION:  3172                                 FACILITY:  [facility]   PHYSICIAN:  Hosie Spangle, M.D.            DATE OF BIRTH:  [birth year]   DATE OF ADMISSION:  11/26/2002  DATE OF DISCHARGE:                                HISTORY & PHYSICAL   HISTORY OF PRESENT ILLNESS:  The patient is a 60 year old right-handed white  female who was evaluated for a foramen magnum tumor.  She initially  presented with cervical radicular symptoms.  MRI of the cervical spine was  performed and revealed an incidental tumor at the level of the foramen  magnum.  She underwent further MRI studies as well as a MRA and vertebral  arteriogram for preoperative planning.  Symptomatically, she describes some  occasional mild headaches, but no change in her typical headache pattern.  She has no difficulty with swallowing or speech.  She has had some neck pain  as well as some left cervical radicular discomfort extending to the  shoulder, arm, with numbness and tingling in the second and third digits of  her left hand, some discomfort extending into those digits.   The patient is admitted now for resection of this tumor via suboccipital  craniectomy and upper cervical laminectomy with subsequent duraplasty.   PAST MEDICAL HISTORY:  1. History of diabetes, treated for the past three years.  2. History of hypertension, treated for the past four to five years.  3. History of hiatal hernia.  4. Gastroesophageal reflux disease.  5. Irritable bowel syndrome.  6. History of fibromyalgia.  7. No history of myocardial infarction, cancer, stroke, or lung disease.   PAST SURGICAL HISTORY:  1. Septoplasty, sinus surgery.  2. Tonsillectomy and adenoidectomy.   ALLERGIES:  No known drug allergies.   MEDICATIONS:  1. Prozac 20 mg q.a.m.  2. Vioxx 25 mg q.a.m.  3. Paxil  40 mg q.a.m.  4. Lisinopril 40 mg q.a.m.  5. Hydrochlorothiazide 25 mg q.a.m.  6. Glucophage XR 500 mg q.h.s.  7. Tizanidine 4 mg q.h.s.  8. Ambien 10 mg q.h.s.  9. Citrucel 30 units q.p.m.  10.      Fiber tabs two tablets q.a.m.  11.      Vitamin E 400 IU q.a.m.   FAMILY HISTORY:  His mother is in good health at age 49 with a history of  degenerative disk disease of the lumbar spine.  His father passed away at  age 76, he had non-insulin dependent diabetes mellitus and hypertension.  There is a family history of diabetes, heart disease, and degenerative disk  disease.   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  She is unmarried.  She does not smoke.  She drinks alcoholic beverages  socially.  She denies history  of substance abuse.   REVIEW OF SYMPTOMS:  Notable for what is described in the history of present  illness and past medical history.  She has described some anxiety and  anemia, but a 14 point review of symptoms she is otherwise unremarkable.   PHYSICAL EXAMINATION:  GENERAL:  The patient is a well-developed, well-  nourished, somewhat heavy set white female.  In no acute distress.  VITAL SIGNS:  Her height is 5 feet 1 inches, weight 218 pounds, temperature  98, pulse 81, blood pressure 156/89, respiratory rate 20.  LUNGS:  Clear to auscultation.  She has symmetrical expiration and  inspiration.  HEART:  Regular rate and rhythm, S1 and S2, there is no murmur.  ABDOMEN:  Soft, nondistended, bowel sounds are present.  EXTREMITIES:  No cyanosis, clubbing, or edema.  NEUROLOGIC:  The patient is awake, alert, fully oriented.  Her speech is  fluent.  She has good comprehension.  Cranial nerves reveal pupils equal,  round, reactive to light.  Extraocular movements were intact.  Facial  sensation is intact to pinprick and light touch.  The facial movement is  symmetrical.  Hearing is intact bilaterally.  Palate moves symmetrical.  Shoulder  shrug is symmetrical.  The tongue protrudes in the midline and well  to either side.  Motor examination shows 5/5 strength throughout the upper  and lower extremities, including the deltoids, biceps, triceps, wrists, and  grip.  Sensation is intact to pinprick.  Reflexes are 1 to 2 in the biceps,  brachial radialis, triceps, quadriceps, trace in the gastrocnemius.  Symmetrically bilaterally, toes are downgoing bilaterally.  She has a normal  gait and stance.   LABORATORY DATA:  Diagnostic studies were notable for an enhancing mass  lesion at the level of the foramen magnum extending both rostrally inferior  most aspect of the posterior fossa, and caudally into the upper cervical  spinal canal.  The lesion vigorously enhances with contrast.   IMPRESSION:  Foramen magnum tumor.  Relatively slow growing.  Due to limited  growth in the interval of 16 months in retrospect a MRI from 7/02, revealed  the tumor, and this showed some enlargement by the scan of 11/03.  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   PLAN:  The patient will be admitted for a suboccipital craniectomy, upper  cervical laminectomy, for resection of tumor.  We discussed with the  patient, her mother, and other family members the nature of her conditions,  indications for surgery, the nature of the surgical procedure, as well as  hospitalization, overall recuperation, and limitations postoperatively, and  the surgical risks of bleeding, infection, possible need for transfusion,  risk of brain stem or spinal cord dysfunction, the risk of paralysis, coma,  brain stem or cranial nerve dysfunction, including difficulty with  breathing, swallowing, speech, the risk of pseudomeningocele, and anesthetic  risk of myocardial infarction, stroke, and death, all of which are increased due to her history of diabetes and hypertension.  Understanding all of this,  the patient  does wish to go ahead with surgery, and is admitted for such.                                               Hosie Spangle, M.D.    RWN/MEDQ  D:  11/26/2002  T:  11/26/2002  Job:  [id number]

## 2011-04-30 NOTE — Discharge Summary (Signed)
   NAMENALEYAH, [name]                           ACCOUNT NO.:  [id number]   MEDICAL RECORD NO.:  [id number]                   PATIENT TYPE:  INP   LOCATION:  3022                                 FACILITY:  [facility]   PHYSICIAN:  Hosie Spangle, M.D.            DATE OF BIRTH:  10/13/1951   DATE OF ADMISSION:  11/26/2002  DATE OF DISCHARGE:  11/30/2002                                 DISCHARGE SUMMARY   ADMISSION PHYSICAL EXAMINATION:  The patient is a 60 year old woman, who was  found to have a tumor at the level of the foramen magnum.  There was an  angiographic finding on a cervical MRI. She has a history of diabetes and  hypertension.  She was urologically intact and general examination was  unremarkable.   HOSPITAL COURSE:  The patient was admitted, underwent a suboccipital  craniectomy, a C1, partial C2 laminectomy, a gross total resection of tumor  with a duraplasty.  Postoperatively, she has done well.  Her wound has  healed nicely.  She is afebrile.  She is up and ambulating.  She remains  neurologically intact.  She has had mild to moderate discomfort which has  gradually eased.  She is being discharged to home with instructions  regarding wound care and activities.  She is to return next week for staple  removal.   DISCHARGE MEDICATIONS:  1. Percocet 1 tab p.o. q.i.d. p.r.n. pain,  30 tabs, no refills.  2. Decadron 4 mg tabs.  She is to take 1 tab b.i.d. today, 1/2 tab b.i.d.     tomorrow, and 1/2 tab q.d. the following day, and then discontinue the     medication.  A total of 3-1/2 tabs were prescribed.   LABORATORY DATA:  Pathology at this time is pending, and the specimens are  being sent off for additional analysis.   FINAL DIAGNOSIS:  Foramen magnum tumor.                                               Hosie Spangle, M.D.    RWN/MEDQ  D:  11/30/2002  T:  12/01/2002  Job:  [id number]

## 2011-04-30 NOTE — Op Note (Signed)
[name], [name]                 ACCOUNT NO.:  [id number]   MEDICAL RECORD NO.:  [id number]          PATIENT TYPE:  AMB   LOCATION:  [facility]                         FACILITY:  [facility]   PHYSICIAN:  Gaynelle Arabian, M.D.    DATE OF BIRTH:  09/18/1951   DATE OF PROCEDURE:  01/24/2006  DATE OF DISCHARGE:                                 OPERATIVE REPORT   PREOPERATIVE DIAGNOSIS:  Left knee meniscal tear.   POSTOPERATIVE DIAGNOSIS:  Left knee meniscal tear plus chondral defect.   SURGEON:  Gaynelle Arabian, M.D.   ASSISTANT:  None.   ANESTHESIA:  Local with MAC.   ESTIMATED BLOOD LOSS:  Minimal.   DRAINS:  None.   COMPLICATIONS:  None.   CONDITION.:  Stable to recovery.   BRIEF CLINICAL NOTE:  [name] is a 60 year old female who has real severe left  knee pain and mechanical symptoms. Exam and history are consistent with  meniscal tears. MRI did show degenerative change plus meniscal tear. She has  failed nonoperative management and presents for arthroscopy and debridement.   PROCEDURE IN DETAIL:  After successful administration of general anesthetic,  a tourniquet was placed high on the left thigh and left lower extremity  prepped and draped in the usual sterile fashion. Standard superomedial and  inferolateral incisions are made, in flow cannula passed superomedial and  camera passed inferolateral. Arthroscopic visualization proceeds. The  undersurface of the patella showed some slight degenerative change. The  trochlea looks normal. Medial and lateral gutters are visualized and there  is no evidence of any loose bodies. Flexion valgus force is applied to the  knee and the medial compartment is entered. She does have chondral defect  off the medial femoral condyle. She also has a medial meniscal tear. Spinal  needle was used to localize the inferomedial portal, incision made, dilator  placed and then a probe placed. The chondral defect is unstable. It is  debrided back to a stable  base with a 4.2 mm shaver. Size about 1 x  2 cm.  The meniscus was debrided back to a stable base with baskets and the 4.2 mm  shaver. Once the medial compartment was done, we visualized the  intercondylar notch and the ACL was normal. The lateral compartment was  entered and there was significant degenerative change bone on bone on the  tibial plateau and femoral condyle. The meniscus has a pretty significant  tear on the body and posterior horn which is debrided back to a stable base  with baskets and a shaver and then sealed with the ArthroCare. The  arthroscopic  equipment is subsequently removed from the inferior portals which are closed  with interrupted 4-0 nylon. 20 mL of 0.25% Marcaine with epinephrine are  injected through the inflow cannula and that is removed and that portal  closed with nylon. A bulky sterile dressing is applied and she is awakened  and transported to recovery in stable condition.      Gaynelle Arabian, M.D.  Electronically Signed     FA/MEDQ  D:  01/24/2006  T:  01/25/2006  Job:  [id number] 

## 2011-05-05 ENCOUNTER — Other Ambulatory Visit: Payer: Self-pay | Admitting: Internal Medicine

## 2011-05-05 ENCOUNTER — Other Ambulatory Visit: Payer: 59

## 2011-05-05 DIAGNOSIS — E119 Type 2 diabetes mellitus without complications: Secondary | ICD-10-CM

## 2011-05-11 ENCOUNTER — Encounter: Payer: Self-pay | Admitting: Internal Medicine

## 2011-05-12 ENCOUNTER — Other Ambulatory Visit (INDEPENDENT_AMBULATORY_CARE_PROVIDER_SITE_OTHER): Payer: 59

## 2011-05-12 ENCOUNTER — Encounter: Payer: Self-pay | Admitting: Internal Medicine

## 2011-05-12 ENCOUNTER — Ambulatory Visit (INDEPENDENT_AMBULATORY_CARE_PROVIDER_SITE_OTHER): Payer: 59 | Admitting: Internal Medicine

## 2011-05-12 DIAGNOSIS — E119 Type 2 diabetes mellitus without complications: Secondary | ICD-10-CM

## 2011-05-12 DIAGNOSIS — M545 Low back pain, unspecified: Secondary | ICD-10-CM

## 2011-05-12 DIAGNOSIS — M25569 Pain in unspecified knee: Secondary | ICD-10-CM

## 2011-05-12 DIAGNOSIS — N259 Disorder resulting from impaired renal tubular function, unspecified: Secondary | ICD-10-CM

## 2011-05-12 LAB — BASIC METABOLIC PANEL
BUN: 21 mg/dL (ref 6–23)
CO2: 28 mEq/L (ref 19–32)
Calcium: 9.7 mg/dL (ref 8.4–10.5)
Chloride: 100 mEq/L (ref 96–112)
Creatinine, Ser: 1 mg/dL (ref 0.4–1.2)
GFR: 59.58 mL/min — ABNORMAL LOW (ref >60.00)
Glucose, Bld: 115 mg/dL — ABNORMAL HIGH (ref 70–99)
Potassium: 4.3 mEq/L (ref 3.5–5.1)
Sodium: 137 mEq/L (ref 135–145)

## 2011-05-12 LAB — HEMOGLOBIN A1C: Hgb A1c MFr Bld: 6.5 % (ref 4.6–6.5)

## 2011-05-12 MED ORDER — OXYCODONE HCL 15 MG PO TB12: 15.0000 mg | ORAL_TABLET | Freq: Four times a day (QID) | ORAL | Status: DC

## 2011-05-12 MED ORDER — OXYCODONE HCL 20 MG PO TB12: 20.0000 mg | ORAL_TABLET | Freq: Two times a day (BID) | ORAL | Status: DC

## 2011-05-12 NOTE — Progress Notes (Signed)
  Subjective:    Patient ID: [name], female    DOB: Feb 27, 1951, 60 y.o.   MRN: [id number]  HPI   The patient is here to follow up on chronic depression, anxiety, headaches and chronic moderate LBP symptoms controlled with medicines, diet and exercise.   Review of Systems  Constitutional: Negative for chills, activity change, appetite change, fatigue and unexpected weight change.  HENT: Negative for congestion, mouth sores and sinus pressure.   Eyes: Negative for visual disturbance.  Respiratory: Negative for cough and chest tightness.   Gastrointestinal: Negative for nausea and abdominal pain.  Genitourinary: Negative for frequency, difficulty urinating and vaginal pain.  Musculoskeletal: Positive for back pain. Negative for gait problem.  Skin: Negative for pallor and rash.  Neurological: Negative for dizziness, tremors, weakness, numbness and headaches.  Psychiatric/Behavioral: Negative for suicidal ideas, confusion and sleep disturbance. The patient is nervous/anxious.        Objective:   Physical Exam  Constitutional: She appears well-developed and well-nourished. No distress.       Obese. In NAD.  HENT:  Head: Normocephalic.  Right Ear: External ear normal.  Left Ear: External ear normal.  Nose: Nose normal.  Mouth/Throat: Oropharynx is clear and moist.  Eyes: Conjunctivae are normal. Pupils are equal, round, and reactive to light. Right eye exhibits no discharge. Left eye exhibits no discharge.  Neck: Normal range of motion. Neck supple. No JVD present. No tracheal deviation present. No thyromegaly present.  Cardiovascular: Normal rate, regular rhythm and normal heart sounds.   Pulmonary/Chest: No stridor. No respiratory distress. She has no wheezes.  Abdominal: Soft. Bowel sounds are normal. She exhibits no distension and no mass. There is no tenderness. There is no rebound and no guarding.  Musculoskeletal: She exhibits no edema and no tenderness.    Lymphadenopathy:    She has no cervical adenopathy.  Neurological: She displays normal reflexes. No cranial nerve deficit. She exhibits normal muscle tone. Coordination normal.  Skin: No rash noted. No erythema.  Psychiatric: Her behavior is normal. Judgment and thought content normal.       Tearful          Assessment & Plan:

## 2011-05-16 ENCOUNTER — Encounter: Payer: Self-pay | Admitting: Internal Medicine

## 2011-05-16 NOTE — Assessment & Plan Note (Signed)
Cont Rx 

## 2011-05-16 NOTE — Assessment & Plan Note (Signed)
Cont Rx Labs are pending

## 2011-05-16 NOTE — Assessment & Plan Note (Signed)
Meds refilled.

## 2011-05-16 NOTE — Assessment & Plan Note (Signed)
Watching labs 

## 2011-06-18 ENCOUNTER — Encounter: Payer: Self-pay | Admitting: Internal Medicine

## 2011-06-18 ENCOUNTER — Telehealth: Payer: Self-pay | Admitting: *Deleted

## 2011-06-18 ENCOUNTER — Ambulatory Visit (INDEPENDENT_AMBULATORY_CARE_PROVIDER_SITE_OTHER): Payer: 59 | Admitting: Internal Medicine

## 2011-06-18 VITALS — BP 130/72 | HR 82 | Temp 97.8°F | Ht 62.0 in

## 2011-06-18 DIAGNOSIS — H6980 Other specified disorders of Eustachian tube, unspecified ear: Secondary | ICD-10-CM

## 2011-06-18 DIAGNOSIS — J309 Allergic rhinitis, unspecified: Secondary | ICD-10-CM

## 2011-06-18 DIAGNOSIS — E119 Type 2 diabetes mellitus without complications: Secondary | ICD-10-CM

## 2011-06-18 DIAGNOSIS — I10 Essential (primary) hypertension: Secondary | ICD-10-CM

## 2011-06-18 MED ORDER — METHYLPREDNISOLONE ACETATE 80 MG/ML IJ SUSP
80.0000 mg | Freq: Once | INTRAMUSCULAR | Status: AC
  Administered 2011-06-18: 80 mg via INTRAMUSCULAR

## 2011-06-18 MED ORDER — PSEUDOEPHEDRINE HCL 30 MG PO TABS: 30.0000 mg | ORAL_TABLET | Freq: Four times a day (QID) | ORAL | Status: AC | PRN

## 2011-06-18 NOTE — Telephone Encounter (Signed)
Orders entered. Pt was already aware to come 1 wk prior to next OV, we would only call back if there were any problems.

## 2011-06-18 NOTE — Patient Instructions (Signed)
It was good to see you today. Steroid shot today - also use decongestants (sudafed) with antihistamines (claritin) as dsicussed If you develop worsening symptoms or fever, call and we can reconsider antibiotics, but it does not appear necessary to use antibiotics at this time.

## 2011-06-18 NOTE — Telephone Encounter (Signed)
BMET, A1c Thx

## 2011-06-18 NOTE — Telephone Encounter (Signed)
Patient requesting OV today - c/o buzzing in ear, no pain but "cant hear". Informed schedulers - set up for apt today

## 2011-06-18 NOTE — Progress Notes (Signed)
  Subjective:    Patient ID: [name], female    DOB: October 26, 1951, 60 y.o.   MRN: [id number]  HPI  complains of R ear discomfort associated with decreased hearing and "fluid feeling" in R ear Onset <24 h ago; unchanged since No trauma, no swelling, no fever, no drainage Chronic sinus issues but not on medications for same No headache but "pressure" feeling and off balance  Not using any drops or OTC meds for same  Past Medical History  Diagnosis Date  . Diabetes mellitus     type II  . GERD (gastroesophageal reflux disease)   . Hypertension   . IBS (irritable bowel syndrome)   . Depression   . Anxiety   . LBP (low back pain)   . Osteoarthritis   . Colitis 2010    microscopic- Dr Henrene Pastor  . Renal insufficiency 2011  . Osteopenia      Review of Systems  Constitutional: Negative for fever.  HENT: Negative for neck pain and postnasal drip.   Respiratory: Negative for cough.        Objective:   Physical Exam BP 130/72  Pulse 82  Temp(Src) 97.8 F (36.6 C) (Oral)  Ht 5\' 2"  (1.575 m)  SpO2 96% Physical Exam  Constitutional: She is overweight; oriented to person, place, and time. She appears well-developed and well-nourished. No distress.  HENT: Head: Normocephalic and atraumatic. Ears; L TM ok, R with clear serous effusion, no erythema; Nose: Nose normal.  Mouth/Throat: Oropharynx is clear and moist. No oropharyngeal exudate.  Eyes: Conjunctivae and EOM are normal. Pupils are equal, round, and reactive to light. No scleral icterus. no nystagmus Neck: Normal range of motion. Neck supple. No JVD present. No thyromegaly present.  Cardiovascular: Normal rate, regular rhythm and normal heart sounds.  No murmur heard. No BLE edema. Pulmonary/Chest: Effort normal and breath sounds normal. No respiratory distress. She has no wheezes.  Neurological: She is alert and oriented to person, place, and time. No cranial nerve deficit. Coordination normal.        Lab Results    Component Value Date   WBC 6.9 10/21/2010   HGB 12.3 10/21/2010   HCT 36.1 10/21/2010   PLT 213.0 10/21/2010   CHOL 176 10/21/2010   TRIG 27.0 10/21/2010   HDL 65.80 10/21/2010   ALT 18 10/21/2010   AST 17 10/21/2010   NA 137 05/12/2011   K 4.3 05/12/2011   CL 100 05/12/2011   CREATININE 1.0 05/12/2011   BUN 21 05/12/2011   CO2 28 05/12/2011   TSH 0.98 10/21/2010   HGBA1C 6.5 05/12/2011     Assessment & Plan:  R side eustachian tube dysfx - clear serous effusion - no symptoms infection - tx with steroid shot for antiinflammatory now and oral decongestants + antihistamine - offered but pt declines nasal steroids

## 2011-06-18 NOTE — Telephone Encounter (Signed)
Patient requesting labs prior to August apt. She is due for A1c, anything else?

## 2011-07-05 ENCOUNTER — Other Ambulatory Visit: Payer: Self-pay | Admitting: Internal Medicine

## 2011-07-07 ENCOUNTER — Other Ambulatory Visit: Payer: Self-pay | Admitting: Internal Medicine

## 2011-07-07 ENCOUNTER — Other Ambulatory Visit: Payer: Self-pay | Admitting: *Deleted

## 2011-07-07 MED ORDER — CARVEDILOL 12.5 MG PO TABS: 12.5000 mg | ORAL_TABLET | Freq: Two times a day (BID) | ORAL | Status: DC

## 2011-07-07 MED ORDER — OMEPRAZOLE 20 MG PO CPDR: 20.0000 mg | DELAYED_RELEASE_CAPSULE | Freq: Every day | ORAL | Status: DC

## 2011-07-19 ENCOUNTER — Telehealth: Payer: Self-pay

## 2011-07-19 NOTE — Telephone Encounter (Signed)
HOld note for [name] to correct dates

## 2011-07-19 NOTE — Telephone Encounter (Signed)
Pharmacy called stating that pt turned in rx for oxy 20 mg and oxy 15 mg.  She states that future rx for oxy 15 mg must clarify that it is immediate release.

## 2011-08-06 ENCOUNTER — Other Ambulatory Visit (INDEPENDENT_AMBULATORY_CARE_PROVIDER_SITE_OTHER): Payer: 59

## 2011-08-06 DIAGNOSIS — E119 Type 2 diabetes mellitus without complications: Secondary | ICD-10-CM

## 2011-08-06 DIAGNOSIS — I10 Essential (primary) hypertension: Secondary | ICD-10-CM

## 2011-08-06 LAB — HEMOGLOBIN A1C: Hgb A1c MFr Bld: 6.6 % — ABNORMAL HIGH (ref 4.6–6.5)

## 2011-08-06 LAB — BASIC METABOLIC PANEL
BUN: 21 mg/dL (ref 6–23)
CO2: 33 mEq/L — ABNORMAL HIGH (ref 19–32)
Calcium: 9.3 mg/dL (ref 8.4–10.5)
Chloride: 100 mEq/L (ref 96–112)
Creatinine, Ser: 0.8 mg/dL (ref 0.4–1.2)
GFR: 79.04 mL/min (ref >60.00)
Glucose, Bld: 123 mg/dL — ABNORMAL HIGH (ref 70–99)
Potassium: 4.3 mEq/L (ref 3.5–5.1)
Sodium: 140 mEq/L (ref 135–145)

## 2011-08-10 MED ORDER — OXYCODONE HCL 20 MG PO TB12: 20.0000 mg | ORAL_TABLET | Freq: Two times a day (BID) | ORAL | Status: DC

## 2011-08-10 MED ORDER — BUPROPION HCL ER (SR) 150 MG PO TB12: 150.0000 mg | ORAL_TABLET | Freq: Two times a day (BID) | ORAL | Status: DC

## 2011-08-10 MED ORDER — OXYCODONE HCL 15 MG PO TABS: 15.0000 mg | ORAL_TABLET | ORAL | Status: DC | PRN

## 2011-08-10 MED ORDER — CYCLOBENZAPRINE HCL 10 MG PO TABS: 5.0000 mg | ORAL_TABLET | Freq: Three times a day (TID) | ORAL | Status: DC | PRN

## 2011-08-10 MED ORDER — LIDOCAINE 5 % EX PTCH: 1.0000 | MEDICATED_PATCH | CUTANEOUS | Status: DC

## 2011-08-10 MED ORDER — METFORMIN HCL 1000 MG PO TABS: 1000.0000 mg | ORAL_TABLET | Freq: Two times a day (BID) | ORAL | Status: DC

## 2011-08-10 MED ORDER — GLUCOSE BLOOD VI STRP: ORAL_STRIP | Status: DC

## 2011-08-10 MED ORDER — OMEPRAZOLE 20 MG PO CPDR: 20.0000 mg | DELAYED_RELEASE_CAPSULE | Freq: Every day | ORAL | Status: DC

## 2011-08-10 MED ORDER — ONETOUCH ULTRASOFT LANCETS MISC: Status: DC

## 2011-08-10 MED ORDER — GLIMEPIRIDE 1 MG PO TABS: 1.0000 mg | ORAL_TABLET | Freq: Every day | ORAL | Status: DC

## 2011-08-10 MED ORDER — CARVEDILOL 12.5 MG PO TABS: 12.5000 mg | ORAL_TABLET | Freq: Two times a day (BID) | ORAL | Status: DC

## 2011-08-10 MED ORDER — LORAZEPAM 2 MG PO TABS: 0.5000 mg | ORAL_TABLET | Freq: Four times a day (QID) | ORAL | Status: DC | PRN

## 2011-08-10 NOTE — Telephone Encounter (Signed)
Spoke w/pt - reconciled her medication list, sent in Rfs and printed Rx's for her to pick up at [facility] tomorrow. Also corrected Rx for oxy 15 - should be IR not 12 hr tabs. She will keep OV tomorrow to discuss evoxac, and get RFs

## 2011-08-10 NOTE — Telephone Encounter (Signed)
Left vm for pt to call office back to discuss w/me.

## 2011-08-10 NOTE — Telephone Encounter (Signed)
Addended by: Gean Maidens on: 08/10/2011 05:01 PM   Modules accepted: Orders, Medications

## 2011-08-11 ENCOUNTER — Ambulatory Visit (INDEPENDENT_AMBULATORY_CARE_PROVIDER_SITE_OTHER): Payer: 59 | Admitting: Internal Medicine

## 2011-08-11 ENCOUNTER — Encounter: Payer: Self-pay | Admitting: Internal Medicine

## 2011-08-11 DIAGNOSIS — K117 Disturbances of salivary secretion: Secondary | ICD-10-CM

## 2011-08-11 DIAGNOSIS — Z0000 Encounter for general adult medical examination without abnormal findings: Secondary | ICD-10-CM

## 2011-08-11 DIAGNOSIS — N259 Disorder resulting from impaired renal tubular function, unspecified: Secondary | ICD-10-CM

## 2011-08-11 DIAGNOSIS — M1990 Unspecified osteoarthritis, unspecified site: Secondary | ICD-10-CM

## 2011-08-11 DIAGNOSIS — F329 Major depressive disorder, single episode, unspecified: Secondary | ICD-10-CM

## 2011-08-11 DIAGNOSIS — E119 Type 2 diabetes mellitus without complications: Secondary | ICD-10-CM

## 2011-08-11 DIAGNOSIS — K219 Gastro-esophageal reflux disease without esophagitis: Secondary | ICD-10-CM

## 2011-08-11 DIAGNOSIS — M545 Low back pain, unspecified: Secondary | ICD-10-CM

## 2011-08-11 DIAGNOSIS — F3289 Other specified depressive episodes: Secondary | ICD-10-CM

## 2011-08-11 MED ORDER — CEVIMELINE HCL 30 MG PO CAPS: 30.0000 mg | ORAL_CAPSULE | Freq: Three times a day (TID) | ORAL | Status: DC

## 2011-08-11 NOTE — Assessment & Plan Note (Signed)
Better on Evoxac

## 2011-08-11 NOTE — Assessment & Plan Note (Signed)
Discussed. Cont Rx

## 2011-08-11 NOTE — Assessment & Plan Note (Signed)
On Rx 

## 2011-08-11 NOTE — Assessment & Plan Note (Signed)
On RX 

## 2011-08-11 NOTE — Assessment & Plan Note (Signed)
Watching labs 

## 2011-08-11 NOTE — Progress Notes (Signed)
  Subjective:    Patient ID: [name], female    DOB: [birth year], 60 y.o.   MRN: [id number]  HPI  The patient presents for a follow-up of  chronic hypertension, chronic dyslipidemia, type 2 diabetes and LBP/knee OA, controlled with medicines    Review of Systems  Constitutional: Positive for fatigue. Negative for chills, activity change, appetite change and unexpected weight change.  HENT: Negative for congestion, mouth sores and sinus pressure.   Eyes: Negative for visual disturbance.  Respiratory: Negative for cough and chest tightness.   Gastrointestinal: Positive for nausea. Negative for abdominal pain.  Genitourinary: Negative for frequency, difficulty urinating and vaginal pain.  Musculoskeletal: Positive for back pain, arthralgias and gait problem.  Skin: Negative for pallor and rash.  Neurological: Negative for dizziness, tremors, weakness, numbness and headaches.  Psychiatric/Behavioral: Positive for dysphoric mood. Negative for suicidal ideas, confusion and sleep disturbance. The patient is nervous/anxious.        Objective:   Physical Exam  Constitutional: She is oriented to person, place, and time. She appears well-developed. No distress.       obese  HENT:  Head: Normocephalic.  Right Ear: External ear normal.  Left Ear: External ear normal.  Nose: Nose normal.  Mouth/Throat: Oropharynx is clear and moist.  Eyes: Conjunctivae are normal. Pupils are equal, round, and reactive to light. Right eye exhibits no discharge. Left eye exhibits no discharge.  Neck: Normal range of motion. Neck supple. No JVD present. No tracheal deviation present. No thyromegaly present.  Cardiovascular: Normal rate, regular rhythm and normal heart sounds.   Pulmonary/Chest: No stridor. No respiratory distress. She has no wheezes.  Abdominal: Soft. Bowel sounds are normal. She exhibits no distension and no mass. There is no tenderness. There is no rebound and no guarding.    Musculoskeletal: She exhibits tenderness (LS, knees). She exhibits no edema.  Lymphadenopathy:    She has no cervical adenopathy.  Neurological: She is oriented to person, place, and time. She displays normal reflexes. No cranial nerve deficit. She exhibits normal muscle tone. Coordination normal.  Skin: No rash noted. No erythema.  Psychiatric: Her behavior is normal. Judgment and thought content normal.       sad     . Lab Results  Component Value Date   WBC 6.9 10/21/2010   HGB 12.3 10/21/2010   HCT 36.1 10/21/2010   PLT 213.0 10/21/2010   CHOL 176 10/21/2010   TRIG 27.0 10/21/2010   HDL 65.80 10/21/2010   ALT 18 10/21/2010   AST 17 10/21/2010   NA 140 08/06/2011   K 4.3 08/06/2011   CL 100 08/06/2011   CREATININE 0.8 08/06/2011   BUN 21 08/06/2011   CO2 33* 08/06/2011   TSH 0.98 10/21/2010   HGBA1C 6.6* 08/06/2011        Assessment & Plan:

## 2011-08-30 ENCOUNTER — Telehealth: Payer: Self-pay | Admitting: *Deleted

## 2011-08-30 NOTE — Telephone Encounter (Signed)
Pt left vm - she is c/o increase in migraines and wanted OV for eval. Offered pt open apt on Thursday am, pt could not take this due to work. Explained "same-day" apts, she will call first thing in AM for apt on day she is able to make it into office.

## 2011-09-03 LAB — BASIC METABOLIC PANEL
BUN: 12
BUN: 15
CO2: 24
CO2: 25
Calcium: 8.2 — ABNORMAL LOW
Calcium: 8.5
Chloride: 100
Chloride: 95 — ABNORMAL LOW
Creatinine, Ser: 0.65
Creatinine, Ser: 0.77
GFR calc Af Amer: >60
GFR calc Af Amer: >60
GFR calc non Af Amer: >60
GFR calc non Af Amer: >60
Glucose, Bld: 205 — ABNORMAL HIGH
Glucose, Bld: 207 — ABNORMAL HIGH
Potassium: 4.5
Potassium: 4.6
Sodium: 133 — ABNORMAL LOW
Sodium: 134 — ABNORMAL LOW

## 2011-09-03 LAB — URINALYSIS, ROUTINE W REFLEX MICROSCOPIC
Bilirubin Urine: NEGATIVE
Glucose, UA: NEGATIVE
Hgb urine dipstick: NEGATIVE
Ketones, ur: NEGATIVE
Nitrite: NEGATIVE
Protein, ur: NEGATIVE
Specific Gravity, Urine: 1.012
Urobilinogen, UA: 0.2
pH: 5.5

## 2011-09-03 LAB — CBC
HCT: 32.5 — ABNORMAL LOW
HCT: 24.1 — ABNORMAL LOW
HCT: 25.6 — ABNORMAL LOW
HCT: 28.8 — ABNORMAL LOW
Hemoglobin: 11.2 — ABNORMAL LOW
Hemoglobin: 8.4 — ABNORMAL LOW
Hemoglobin: 8.8 — ABNORMAL LOW
Hemoglobin: 9.8 — ABNORMAL LOW
MCHC: 34.3
MCHC: 34.2
MCHC: 34.5
MCHC: 34.6
MCV: 86.7
MCV: 86.9
MCV: 87.6
MCV: 87.7
Platelets: 252
Platelets: 222
Platelets: 224
Platelets: 276
RBC: 3.75 — ABNORMAL LOW
RBC: 2.76 — ABNORMAL LOW
RBC: 2.94 — ABNORMAL LOW
RBC: 3.28 — ABNORMAL LOW
RDW: 12.8
RDW: 12.4
RDW: 12.4
RDW: 12.6
WBC: 9.4
WBC: 11 — ABNORMAL HIGH
WBC: 11.1 — ABNORMAL HIGH
WBC: 12.2 — ABNORMAL HIGH

## 2011-09-03 LAB — PROTIME-INR
INR: 0.9
INR: 1
INR: 1.2
INR: 1.5
Prothrombin Time: 12.3
Prothrombin Time: 13.6
Prothrombin Time: 15.3 — ABNORMAL HIGH
Prothrombin Time: 18.8 — ABNORMAL HIGH

## 2011-09-03 LAB — COMPREHENSIVE METABOLIC PANEL
ALT: 16
AST: 17
Albumin: 3.6
Alkaline Phosphatase: 100
BUN: 28 — ABNORMAL HIGH
CO2: 20
Calcium: 9.4
Chloride: 108
Creatinine, Ser: 1.06
GFR calc Af Amer: >60
GFR calc non Af Amer: 54 — ABNORMAL LOW
Glucose, Bld: 228 — ABNORMAL HIGH
Potassium: 4.4
Sodium: 138
Total Bilirubin: 0.4
Total Protein: 6.7

## 2011-09-03 LAB — TYPE AND SCREEN
ABO/RH(D): O POS
Antibody Screen: NEGATIVE

## 2011-09-03 LAB — APTT: aPTT: 30

## 2011-09-13 LAB — GLUCOSE, CAPILLARY
Glucose-Capillary: 128 — ABNORMAL HIGH
Glucose-Capillary: 137 — ABNORMAL HIGH
Glucose-Capillary: 141 — ABNORMAL HIGH
Glucose-Capillary: 145 — ABNORMAL HIGH
Glucose-Capillary: 154 — ABNORMAL HIGH
Glucose-Capillary: 157 — ABNORMAL HIGH
Glucose-Capillary: 157 — ABNORMAL HIGH
Glucose-Capillary: 162 — ABNORMAL HIGH
Glucose-Capillary: 175 — ABNORMAL HIGH
Glucose-Capillary: 182 — ABNORMAL HIGH
Glucose-Capillary: 183 — ABNORMAL HIGH
Glucose-Capillary: 185 — ABNORMAL HIGH
Glucose-Capillary: 186 — ABNORMAL HIGH
Glucose-Capillary: 187 — ABNORMAL HIGH
Glucose-Capillary: 191 — ABNORMAL HIGH
Glucose-Capillary: 197 — ABNORMAL HIGH
Glucose-Capillary: 206 — ABNORMAL HIGH
Glucose-Capillary: 221 — ABNORMAL HIGH
Glucose-Capillary: 99

## 2011-09-13 LAB — BASIC METABOLIC PANEL
BUN: 11
BUN: 22
BUN: 33 — ABNORMAL HIGH
CO2: 19
CO2: 19
CO2: 24
Calcium: 8.5
Calcium: 8.8
Calcium: 8.8
Chloride: 101
Chloride: 103
Chloride: 99
Creatinine, Ser: 0.83
Creatinine, Ser: 0.88
Creatinine, Ser: 1.02
GFR calc Af Amer: >60
GFR calc Af Amer: >60
GFR calc Af Amer: >60
GFR calc non Af Amer: 56 — ABNORMAL LOW
GFR calc non Af Amer: >60
GFR calc non Af Amer: >60
Glucose, Bld: 146 — ABNORMAL HIGH
Glucose, Bld: 161 — ABNORMAL HIGH
Glucose, Bld: 256 — ABNORMAL HIGH
Potassium: 3.9
Potassium: 4.1
Potassium: 4.6
Sodium: 127 — ABNORMAL LOW
Sodium: 128 — ABNORMAL LOW
Sodium: 135

## 2011-09-13 LAB — CBC
HCT: 33.8 — ABNORMAL LOW
HCT: 37.2
Hemoglobin: 11 — ABNORMAL LOW
Hemoglobin: 12.3
MCHC: 32.6
MCHC: 33
MCV: 89.7
MCV: 89.8
Platelets: 231
Platelets: 300
RBC: 3.77 — ABNORMAL LOW
RBC: 4.15
RDW: 14
RDW: 14
WBC: 8.5
WBC: 9

## 2011-09-13 LAB — OVA AND PARASITE EXAMINATION

## 2011-09-13 LAB — HEMOGLOBIN A1C
Hgb A1c MFr Bld: 6.9 — ABNORMAL HIGH
Mean Plasma Glucose: 151

## 2011-09-13 LAB — COMPREHENSIVE METABOLIC PANEL
ALT: 23
AST: 18
Albumin: 3.3 — ABNORMAL LOW
Alkaline Phosphatase: 82
BUN: 56 — ABNORMAL HIGH
CO2: 19
Calcium: 9.8
Chloride: 101
Creatinine, Ser: 1.67 — ABNORMAL HIGH
GFR calc Af Amer: 38 — ABNORMAL LOW
GFR calc non Af Amer: 32 — ABNORMAL LOW
Glucose, Bld: 184 — ABNORMAL HIGH
Potassium: 5.8 — ABNORMAL HIGH
Sodium: 130 — ABNORMAL LOW
Total Bilirubin: 0.5
Total Protein: 7

## 2011-09-13 LAB — FECAL LACTOFERRIN, QUANT: Fecal Lactoferrin: POSITIVE

## 2011-09-13 LAB — CLOSTRIDIUM DIFFICILE EIA
C difficile Toxins A+B, EIA: NEGATIVE
C difficile Toxins A+B, EIA: NEGATIVE
C difficile Toxins A+B, EIA: NEGATIVE

## 2011-09-13 LAB — DIFFERENTIAL
Basophils Absolute: 0
Basophils Relative: 0
Eosinophils Absolute: 0.3
Eosinophils Relative: 3
Lymphocytes Relative: 28
Lymphs Abs: 2.4
Monocytes Absolute: 1.5 — ABNORMAL HIGH
Monocytes Relative: 18 — ABNORMAL HIGH
Neutro Abs: 4.2
Neutrophils Relative %: 50

## 2011-09-13 LAB — STOOL CULTURE

## 2011-09-22 LAB — TYPE AND SCREEN
ABO/RH(D): O POS
Antibody Screen: NEGATIVE

## 2011-09-22 LAB — PROTIME-INR
INR: 1
INR: 1.1
INR: 1.7 — ABNORMAL HIGH
INR: 1.9 — ABNORMAL HIGH
INR: 1.9 — ABNORMAL HIGH
Prothrombin Time: 13
Prothrombin Time: 14.7
Prothrombin Time: 20.1 — ABNORMAL HIGH
Prothrombin Time: 22.3 — ABNORMAL HIGH
Prothrombin Time: 22.5 — ABNORMAL HIGH

## 2011-09-22 LAB — COMPREHENSIVE METABOLIC PANEL
ALT: 20
AST: 18
Albumin: 3.1 — ABNORMAL LOW
Alkaline Phosphatase: 96
BUN: 33 — ABNORMAL HIGH
CO2: 25
Calcium: 9.6
Chloride: 103
Creatinine, Ser: 0.9
GFR calc Af Amer: >60
GFR calc non Af Amer: >60
Glucose, Bld: 215 — ABNORMAL HIGH
Potassium: 5
Sodium: 138
Total Bilirubin: 0.5
Total Protein: 6.3

## 2011-09-22 LAB — BASIC METABOLIC PANEL
BUN: 11
BUN: 11
BUN: 12
CO2: 29
CO2: 29
CO2: 29
Calcium: 8.3 — ABNORMAL LOW
Calcium: 8.5
Calcium: 8.7
Chloride: 102
Chloride: 102
Chloride: 105
Creatinine, Ser: 0.72
Creatinine, Ser: 0.81
Creatinine, Ser: 0.82
GFR calc Af Amer: >60
GFR calc Af Amer: >60
GFR calc Af Amer: >60
GFR calc non Af Amer: >60
GFR calc non Af Amer: >60
GFR calc non Af Amer: >60
Glucose, Bld: 172 — ABNORMAL HIGH
Glucose, Bld: 184 — ABNORMAL HIGH
Glucose, Bld: 215 — ABNORMAL HIGH
Potassium: 4.3
Potassium: 4.6
Potassium: 4.7
Sodium: 136
Sodium: 137
Sodium: 138

## 2011-09-22 LAB — CBC
HCT: 21.6 — ABNORMAL LOW
HCT: 26 — ABNORMAL LOW
HCT: 28 — ABNORMAL LOW
HCT: 33.2 — ABNORMAL LOW
Hemoglobin: 11.4 — ABNORMAL LOW
Hemoglobin: 7.3 — CL
Hemoglobin: 8.9 — ABNORMAL LOW
Hemoglobin: 9.6 — ABNORMAL LOW
MCHC: 33.6
MCHC: 34.1
MCHC: 34.2
MCHC: 34.4
MCV: 85.9
MCV: 86.6
MCV: 87.8
MCV: 87.9
Platelets: 208
Platelets: 208
Platelets: 259
Platelets: 294
RBC: 2.46 — ABNORMAL LOW
RBC: 3 — ABNORMAL LOW
RBC: 3.19 — ABNORMAL LOW
RBC: 3.86 — ABNORMAL LOW
RDW: 13.4
RDW: 13.5
RDW: 13.6
RDW: 13.6
WBC: 10.1
WBC: 10.9 — ABNORMAL HIGH
WBC: 11 — ABNORMAL HIGH
WBC: 9.2

## 2011-09-22 LAB — URINALYSIS, ROUTINE W REFLEX MICROSCOPIC
Bilirubin Urine: NEGATIVE
Glucose, UA: NEGATIVE
Ketones, ur: NEGATIVE
Leukocytes, UA: NEGATIVE
Nitrite: NEGATIVE
Protein, ur: NEGATIVE
Specific Gravity, Urine: 1.013
Urobilinogen, UA: 0.2
pH: 6

## 2011-09-22 LAB — ABO/RH: ABO/RH(D): O POS

## 2011-09-22 LAB — APTT: aPTT: 31

## 2011-09-22 LAB — URINE MICROSCOPIC-ADD ON

## 2011-09-22 LAB — PREPARE RBC (CROSSMATCH)

## 2011-11-08 ENCOUNTER — Other Ambulatory Visit (INDEPENDENT_AMBULATORY_CARE_PROVIDER_SITE_OTHER): Payer: 59

## 2011-11-08 ENCOUNTER — Other Ambulatory Visit: Payer: Self-pay | Admitting: Internal Medicine

## 2011-11-08 DIAGNOSIS — E119 Type 2 diabetes mellitus without complications: Secondary | ICD-10-CM

## 2011-11-08 DIAGNOSIS — Z0000 Encounter for general adult medical examination without abnormal findings: Secondary | ICD-10-CM

## 2011-11-08 LAB — CBC WITH DIFFERENTIAL/PLATELET
Basophils Absolute: 0.1 10*3/uL (ref 0.0–0.1)
Basophils Relative: 0.6 % (ref 0.0–3.0)
Eosinophils Absolute: 0.3 10*3/uL (ref 0.0–0.7)
Eosinophils Relative: 2.8 % (ref 0.0–5.0)
HCT: 37.5 % (ref 36.0–46.0)
Hemoglobin: 12.6 g/dL (ref 12.0–15.0)
Lymphocytes Relative: 37.9 % (ref 12.0–46.0)
Lymphs Abs: 3.8 10*3/uL (ref 0.7–4.0)
MCHC: 33.7 g/dL (ref 30.0–36.0)
MCV: 93 fl (ref 78.0–100.0)
Monocytes Absolute: 0.5 10*3/uL (ref 0.1–1.0)
Monocytes Relative: 5.1 % (ref 3.0–12.0)
Neutro Abs: 5.4 10*3/uL (ref 1.4–7.7)
Neutrophils Relative %: 53.6 % (ref 43.0–77.0)
Platelets: 224 10*3/uL (ref 150.0–400.0)
RBC: 4.03 Mil/uL (ref 3.87–5.11)
RDW: 12.2 % (ref 11.5–14.6)
WBC: 10.1 10*3/uL (ref 4.5–10.5)

## 2011-11-08 LAB — URINALYSIS, ROUTINE W REFLEX MICROSCOPIC
Bilirubin Urine: NEGATIVE
Ketones, ur: NEGATIVE
Leukocytes, UA: NEGATIVE
Nitrite: NEGATIVE
Specific Gravity, Urine: <=1.005 (ref 1.000–1.030)
Total Protein, Urine: NEGATIVE
Urine Glucose: NEGATIVE
Urobilinogen, UA: 0.2 (ref 0.0–1.0)
pH: 5.5 (ref 5.0–8.0)

## 2011-11-08 LAB — COMPREHENSIVE METABOLIC PANEL
ALT: 19 U/L (ref 0–35)
AST: 19 U/L (ref 0–37)
Albumin: 4.1 g/dL (ref 3.5–5.2)
Alkaline Phosphatase: 65 U/L (ref 39–117)
BUN: 26 mg/dL — ABNORMAL HIGH (ref 6–23)
CO2: 26 mEq/L (ref 19–32)
Calcium: 9.8 mg/dL (ref 8.4–10.5)
Chloride: 101 mEq/L (ref 96–112)
Creatinine, Ser: 1 mg/dL (ref 0.4–1.2)
GFR: 59.48 mL/min — ABNORMAL LOW (ref >60.00)
Glucose, Bld: 97 mg/dL (ref 70–99)
Potassium: 4.5 mEq/L (ref 3.5–5.1)
Sodium: 139 mEq/L (ref 135–145)
Total Bilirubin: 0.4 mg/dL (ref 0.3–1.2)
Total Protein: 7.4 g/dL (ref 6.0–8.3)

## 2011-11-08 LAB — LIPID PANEL
Cholesterol: 199 mg/dL (ref 0–200)
HDL: 62 mg/dL (ref >39.00)
LDL Cholesterol: 119 mg/dL — ABNORMAL HIGH (ref 0–99)
Total CHOL/HDL Ratio: 3
Triglycerides: 89 mg/dL (ref 0.0–149.0)
VLDL: 17.8 mg/dL (ref 0.0–40.0)

## 2011-11-08 LAB — TSH: TSH: 2.47 u[IU]/mL (ref 0.35–5.50)

## 2011-11-08 LAB — HEMOGLOBIN A1C: Hgb A1c MFr Bld: 6.4 % (ref 4.6–6.5)

## 2011-11-10 ENCOUNTER — Ambulatory Visit (INDEPENDENT_AMBULATORY_CARE_PROVIDER_SITE_OTHER): Payer: 59 | Admitting: Internal Medicine

## 2011-11-10 ENCOUNTER — Encounter: Payer: Self-pay | Admitting: Internal Medicine

## 2011-11-10 VITALS — BP 160/88 | HR 80 | Temp 98.7°F | Resp 16 | Wt 219.0 lb

## 2011-11-10 DIAGNOSIS — M545 Low back pain, unspecified: Secondary | ICD-10-CM

## 2011-11-10 DIAGNOSIS — R5383 Other fatigue: Secondary | ICD-10-CM

## 2011-11-10 DIAGNOSIS — F3289 Other specified depressive episodes: Secondary | ICD-10-CM

## 2011-11-10 DIAGNOSIS — F4321 Adjustment disorder with depressed mood: Secondary | ICD-10-CM

## 2011-11-10 DIAGNOSIS — E119 Type 2 diabetes mellitus without complications: Secondary | ICD-10-CM

## 2011-11-10 DIAGNOSIS — N259 Disorder resulting from impaired renal tubular function, unspecified: Secondary | ICD-10-CM

## 2011-11-10 DIAGNOSIS — I10 Essential (primary) hypertension: Secondary | ICD-10-CM

## 2011-11-10 DIAGNOSIS — F329 Major depressive disorder, single episode, unspecified: Secondary | ICD-10-CM

## 2011-11-10 DIAGNOSIS — R5381 Other malaise: Secondary | ICD-10-CM

## 2011-11-10 DIAGNOSIS — F411 Generalized anxiety disorder: Secondary | ICD-10-CM

## 2011-11-10 MED ORDER — OXYCODONE HCL 15 MG PO TABS: 15.0000 mg | ORAL_TABLET | ORAL | Status: DC | PRN

## 2011-11-10 MED ORDER — OXYCODONE HCL 20 MG PO TB12: 20.0000 mg | ORAL_TABLET | Freq: Two times a day (BID) | ORAL | Status: DC

## 2011-11-10 MED ORDER — CEVIMELINE HCL 30 MG PO CAPS: 30.0000 mg | ORAL_CAPSULE | Freq: Three times a day (TID) | ORAL | Status: DC

## 2011-11-10 NOTE — Assessment & Plan Note (Signed)
Continue with current prescription therapy as reflected on the Med list.  

## 2011-11-10 NOTE — Progress Notes (Signed)
  Subjective:    Patient ID: [name], female    DOB: 1951/05/06, 60 y.o.   MRN: [id number]  HPI  The patient presents for a follow-up of  chronic hypertension, chronic dyslipidemia, type 2 diabetes controlled with medicines. F/u chronic LBP and B knee pain. Her mother died this am...   Review of Systems  Constitutional: Positive for fatigue. Negative for chills, activity change, appetite change and unexpected weight change.  HENT: Negative for congestion, mouth sores and sinus pressure.   Eyes: Negative for visual disturbance.  Respiratory: Negative for cough and chest tightness.   Gastrointestinal: Negative for nausea and abdominal pain.  Genitourinary: Negative for frequency, difficulty urinating and vaginal pain.  Musculoskeletal: Positive for back pain, arthralgias and gait problem.  Skin: Negative for pallor and rash.  Neurological: Negative for dizziness, tremors, weakness, numbness and headaches.  Psychiatric/Behavioral: Positive for sleep disturbance and dysphoric mood. Negative for suicidal ideas and confusion. The patient is nervous/anxious.    Wt Readings from Last 3 Encounters:  11/10/11 219 lb (99.338 kg)  08/11/11 211 lb (95.709 kg)  05/12/11 212 lb (96.163 kg)        Objective:   Physical Exam  Constitutional: She appears well-developed. No distress.       obese  HENT:  Head: Normocephalic.  Right Ear: External ear normal.  Left Ear: External ear normal.  Nose: Nose normal.  Mouth/Throat: Oropharynx is clear and moist.  Eyes: Conjunctivae are normal. Pupils are equal, round, and reactive to light. Right eye exhibits no discharge. Left eye exhibits no discharge.  Neck: Normal range of motion. Neck supple. No JVD present. No tracheal deviation present. No thyromegaly present.  Cardiovascular: Normal rate, regular rhythm and normal heart sounds.   Pulmonary/Chest: No stridor. No respiratory distress. She has no wheezes.  Abdominal: Soft. Bowel sounds are  normal. She exhibits no distension and no mass. There is no tenderness. There is no rebound and no guarding.  Musculoskeletal: She exhibits tenderness. She exhibits no edema.  Lymphadenopathy:    She has no cervical adenopathy.  Neurological: She displays normal reflexes. No cranial nerve deficit. She exhibits normal muscle tone. Coordination normal.  Skin: No rash noted. No erythema.  Psychiatric: Her behavior is normal. Judgment and thought content normal.       Tearful, depressed    Lab Results  Component Value Date   WBC 10.1 11/08/2011   HGB 12.6 11/08/2011   HCT 37.5 11/08/2011   PLT 224.0 11/08/2011   GLUCOSE 97 11/08/2011   CHOL 199 11/08/2011   TRIG 89.0 11/08/2011   HDL 62.00 11/08/2011   LDLCALC 119* 11/08/2011   ALT 19 11/08/2011   AST 19 11/08/2011   NA 139 11/08/2011   K 4.5 11/08/2011   CL 101 11/08/2011   CREATININE 1.0 11/08/2011   BUN 26* 11/08/2011   CO2 26 11/08/2011   TSH 2.47 11/08/2011   INR 1.5 02/05/2008   HGBA1C 6.4 11/08/2011         Assessment & Plan:

## 2011-11-10 NOTE — Assessment & Plan Note (Signed)
Chronic and severe. Chronic opioid use.  Potential benefits of a long term opioids use as well as potential risks (i.e. addiction risk, apnea etc) and complications (i.e. Somnolence, constipation and others) were explained to the patient and were aknowledged.

## 2011-11-10 NOTE — Assessment & Plan Note (Signed)
Discussed.

## 2011-11-19 ENCOUNTER — Other Ambulatory Visit: Payer: Self-pay | Admitting: *Deleted

## 2011-11-19 MED ORDER — LORAZEPAM 2 MG PO TABS: 0.5000 mg | ORAL_TABLET | Freq: Four times a day (QID) | ORAL | Status: DC | PRN

## 2011-11-19 NOTE — Telephone Encounter (Signed)
Faxed script back to [facility] @ [phone]...11/19/11@2 :14pm/LMB

## 2012-02-02 ENCOUNTER — Other Ambulatory Visit (INDEPENDENT_AMBULATORY_CARE_PROVIDER_SITE_OTHER): Payer: 59

## 2012-02-02 ENCOUNTER — Encounter: Payer: Self-pay | Admitting: Internal Medicine

## 2012-02-02 ENCOUNTER — Ambulatory Visit (INDEPENDENT_AMBULATORY_CARE_PROVIDER_SITE_OTHER): Payer: 59 | Admitting: Internal Medicine

## 2012-02-02 VITALS — BP 160/90 | HR 84 | Temp 97.6°F | Resp 16 | Wt 216.0 lb

## 2012-02-02 DIAGNOSIS — F329 Major depressive disorder, single episode, unspecified: Secondary | ICD-10-CM

## 2012-02-02 DIAGNOSIS — F3289 Other specified depressive episodes: Secondary | ICD-10-CM

## 2012-02-02 DIAGNOSIS — F411 Generalized anxiety disorder: Secondary | ICD-10-CM

## 2012-02-02 DIAGNOSIS — M545 Low back pain, unspecified: Secondary | ICD-10-CM

## 2012-02-02 DIAGNOSIS — I10 Essential (primary) hypertension: Secondary | ICD-10-CM

## 2012-02-02 DIAGNOSIS — R5381 Other malaise: Secondary | ICD-10-CM

## 2012-02-02 DIAGNOSIS — N259 Disorder resulting from impaired renal tubular function, unspecified: Secondary | ICD-10-CM

## 2012-02-02 DIAGNOSIS — E119 Type 2 diabetes mellitus without complications: Secondary | ICD-10-CM

## 2012-02-02 LAB — BASIC METABOLIC PANEL
BUN: 22 mg/dL (ref 6–23)
CO2: 27 mEq/L (ref 19–32)
Calcium: 9.5 mg/dL (ref 8.4–10.5)
Chloride: 98 mEq/L (ref 96–112)
Creatinine, Ser: 0.9 mg/dL (ref 0.4–1.2)
GFR: 69.67 mL/min (ref >60.00)
Glucose, Bld: 102 mg/dL — ABNORMAL HIGH (ref 70–99)
Potassium: 4.6 mEq/L (ref 3.5–5.1)
Sodium: 137 mEq/L (ref 135–145)

## 2012-02-02 LAB — HEMOGLOBIN A1C: Hgb A1c MFr Bld: 6.7 % — ABNORMAL HIGH (ref 4.6–6.5)

## 2012-02-02 MED ORDER — DULOXETINE HCL 60 MG PO CPEP: 60.0000 mg | ORAL_CAPSULE | Freq: Every day | ORAL | Status: DC

## 2012-02-02 MED ORDER — OXYCODONE HCL 20 MG PO TB12: 20.0000 mg | ORAL_TABLET | Freq: Two times a day (BID) | ORAL | Status: DC

## 2012-02-02 MED ORDER — LORAZEPAM 2 MG PO TABS: 0.5000 mg | ORAL_TABLET | Freq: Four times a day (QID) | ORAL | Status: DC | PRN

## 2012-02-02 MED ORDER — LIDOCAINE 5 % EX PTCH: 1.0000 | MEDICATED_PATCH | CUTANEOUS | Status: DC

## 2012-02-02 MED ORDER — OXYCODONE HCL 15 MG PO TABS: 15.0000 mg | ORAL_TABLET | ORAL | Status: DC | PRN

## 2012-02-02 NOTE — Progress Notes (Signed)
Patient ID: [name], female   DOB: Aug 22, 1951, 61 y.o.   MRN: [id number]  Subjective:    Patient ID: [name], female    DOB: [birth year], 61 y.o.   MRN: [id number]  HPI  The patient presents for a follow-up of  chronic hypertension, chronic dyslipidemia, type 2 diabetes controlled with medicines. F/u chronic LBP and B knee pain. Her mother died this am...  BP Readings from Last 3 Encounters:  02/02/12 160/90  11/10/11 160/88  08/11/11 120/70     Review of Systems  Constitutional: Positive for fatigue. Negative for chills, activity change, appetite change and unexpected weight change.  HENT: Negative for congestion, mouth sores and sinus pressure.   Eyes: Negative for visual disturbance.  Respiratory: Negative for cough and chest tightness.   Gastrointestinal: Negative for nausea and abdominal pain.  Genitourinary: Negative for frequency, difficulty urinating and vaginal pain.  Musculoskeletal: Positive for back pain, arthralgias and gait problem.  Skin: Negative for pallor and rash.  Neurological: Negative for dizziness, tremors, weakness, numbness and headaches.  Psychiatric/Behavioral: Positive for sleep disturbance and dysphoric mood. Negative for suicidal ideas and confusion. The patient is nervous/anxious.    Wt Readings from Last 3 Encounters:  02/02/12 216 lb (97.977 kg)  11/10/11 219 lb (99.338 kg)  08/11/11 211 lb (95.709 kg)        Objective:   Physical Exam  Constitutional: She appears well-developed. No distress.       obese  HENT:  Head: Normocephalic.  Right Ear: External ear normal.  Left Ear: External ear normal.  Nose: Nose normal.  Mouth/Throat: Oropharynx is clear and moist.  Eyes: Conjunctivae are normal. Pupils are equal, round, and reactive to light. Right eye exhibits no discharge. Left eye exhibits no discharge.  Neck: Normal range of motion. Neck supple. No JVD present. No tracheal deviation present. No thyromegaly present.    Cardiovascular: Normal rate, regular rhythm and normal heart sounds.   Pulmonary/Chest: No stridor. No respiratory distress. She has no wheezes.  Abdominal: Soft. Bowel sounds are normal. She exhibits no distension and no mass. There is no tenderness. There is no rebound and no guarding.  Musculoskeletal: She exhibits tenderness. She exhibits no edema.  Lymphadenopathy:    She has no cervical adenopathy.  Neurological: She displays normal reflexes. No cranial nerve deficit. She exhibits normal muscle tone. Coordination normal.  Skin: No rash noted. No erythema.  Psychiatric: Her behavior is normal. Judgment and thought content normal.       Tearful, depressed    Lab Results  Component Value Date   WBC 10.1 11/08/2011   HGB 12.6 11/08/2011   HCT 37.5 11/08/2011   PLT 224.0 11/08/2011   GLUCOSE 97 11/08/2011   CHOL 199 11/08/2011   TRIG 89.0 11/08/2011   HDL 62.00 11/08/2011   LDLCALC 119* 11/08/2011   ALT 19 11/08/2011   AST 19 11/08/2011   NA 139 11/08/2011   K 4.5 11/08/2011   CL 101 11/08/2011   CREATININE 1.0 11/08/2011   BUN 26* 11/08/2011   CO2 26 11/08/2011   TSH 2.47 11/08/2011   INR 1.5 02/05/2008   HGBA1C 6.4 11/08/2011         Assessment & Plan:

## 2012-02-02 NOTE — Assessment & Plan Note (Signed)
Will try to add Cymbalta 30 mg a day

## 2012-02-02 NOTE — Assessment & Plan Note (Signed)
Chronic A lot of stress at work, grieving her mom's death  Potential benefits of a long term benzodiazepines  use as well as potential risks  and complications were explained to the patient and were aknowledged.

## 2012-02-04 ENCOUNTER — Telehealth: Payer: Self-pay | Admitting: Internal Medicine

## 2012-02-04 NOTE — Telephone Encounter (Signed)
Please, mail the labs to the patient.     Thx 

## 2012-02-07 ENCOUNTER — Other Ambulatory Visit: Payer: Self-pay | Admitting: Internal Medicine

## 2012-02-07 NOTE — Telephone Encounter (Signed)
Done

## 2012-04-24 ENCOUNTER — Ambulatory Visit (INDEPENDENT_AMBULATORY_CARE_PROVIDER_SITE_OTHER): Payer: 59 | Admitting: Internal Medicine

## 2012-04-24 ENCOUNTER — Encounter: Payer: Self-pay | Admitting: Internal Medicine

## 2012-04-24 VITALS — BP 140/80 | HR 88 | Temp 98.4°F | Wt 217.0 lb

## 2012-04-24 DIAGNOSIS — I10 Essential (primary) hypertension: Secondary | ICD-10-CM

## 2012-04-24 DIAGNOSIS — M545 Low back pain, unspecified: Secondary | ICD-10-CM

## 2012-04-24 DIAGNOSIS — F4321 Adjustment disorder with depressed mood: Secondary | ICD-10-CM

## 2012-04-24 DIAGNOSIS — R609 Edema, unspecified: Secondary | ICD-10-CM | POA: Insufficient documentation

## 2012-04-24 DIAGNOSIS — Z23 Encounter for immunization: Secondary | ICD-10-CM

## 2012-04-24 MED ORDER — OXYCODONE HCL 20 MG PO TB12: 20.0000 mg | ORAL_TABLET | Freq: Two times a day (BID) | ORAL | Status: DC

## 2012-04-24 MED ORDER — OXYCODONE HCL 15 MG PO TABS: 15.0000 mg | ORAL_TABLET | ORAL | Status: DC | PRN

## 2012-04-24 MED ORDER — FUROSEMIDE 40 MG PO TABS: 20.0000 mg | ORAL_TABLET | Freq: Every day | ORAL | Status: DC | PRN

## 2012-04-24 NOTE — Assessment & Plan Note (Signed)
Continue with current prescription therapy as reflected on the Med list.  

## 2012-04-24 NOTE — Progress Notes (Signed)
Patient ID: [name], female   DOB: August 09, 1951, 61 y.o.   MRN: [id number] Patient ID: [name], female   DOB: October 07, 1951, 61 y.o.   MRN: [id number]  Subjective:    Patient ID: [name], female    DOB: March 01, 1951, 61 y.o.   MRN: [id number]  HPI  The patient presents for a follow-up of  chronic hypertension, chronic dyslipidemia, type 2 diabetes controlled with medicines. F/u chronic LBP and B knee pain. C/o B LE swelling (has had it before, used compr hose)   BP Readings from Last 3 Encounters:  04/24/12 140/80  02/02/12 160/90  11/10/11 160/88     Review of Systems  Constitutional: Positive for fatigue. Negative for chills, activity change, appetite change and unexpected weight change.  HENT: Negative for congestion, mouth sores and sinus pressure.   Eyes: Negative for visual disturbance.  Respiratory: Negative for cough and chest tightness.   Gastrointestinal: Negative for nausea and abdominal pain.  Genitourinary: Negative for frequency, difficulty urinating and vaginal pain.  Musculoskeletal: Positive for back pain, arthralgias and gait problem.  Skin: Negative for pallor and rash.  Neurological: Negative for dizziness, tremors, weakness, numbness and headaches.  Psychiatric/Behavioral: Positive for sleep disturbance and dysphoric mood. Negative for suicidal ideas and confusion. The patient is nervous/anxious.    Wt Readings from Last 3 Encounters:  04/24/12 217 lb (98.431 kg)  02/02/12 216 lb (97.977 kg)  11/10/11 219 lb (99.338 kg)        Objective:   Physical Exam  Constitutional: She appears well-developed. No distress.       obese  HENT:  Head: Normocephalic.  Right Ear: External ear normal.  Left Ear: External ear normal.  Nose: Nose normal.  Mouth/Throat: Oropharynx is clear and moist.  Eyes: Conjunctivae are normal. Pupils are equal, round, and reactive to light. Right eye exhibits no discharge. Left eye exhibits no discharge.  Neck: Normal range  of motion. Neck supple. No JVD present. No tracheal deviation present. No thyromegaly present.  Cardiovascular: Normal rate, regular rhythm and normal heart sounds.   Pulmonary/Chest: No stridor. No respiratory distress. She has no wheezes.  Abdominal: Soft. Bowel sounds are normal. She exhibits no distension and no mass. There is no tenderness. There is no rebound and no guarding.  Musculoskeletal: She exhibits tenderness. She exhibits no edema.  Lymphadenopathy:    She has no cervical adenopathy.  Neurological: She displays normal reflexes. No cranial nerve deficit. She exhibits normal muscle tone. Coordination normal.  Skin: No rash noted. No erythema.  Psychiatric: Her behavior is normal. Judgment and thought content normal.       Tearful, depressed    Lab Results  Component Value Date   WBC 10.1 11/08/2011   HGB 12.6 11/08/2011   HCT 37.5 11/08/2011   PLT 224.0 11/08/2011   GLUCOSE 102* 02/02/2012   CHOL 199 11/08/2011   TRIG 89.0 11/08/2011   HDL 62.00 11/08/2011   LDLCALC 119* 11/08/2011   ALT 19 11/08/2011   AST 19 11/08/2011   NA 137 02/02/2012   K 4.6 02/02/2012   CL 98 02/02/2012   CREATININE 0.9 02/02/2012   BUN 22 02/02/2012   CO2 27 02/02/2012   TSH 2.47 11/08/2011   INR 1.5 02/05/2008   HGBA1C 6.7* 02/02/2012         Assessment & Plan:

## 2012-04-24 NOTE — Assessment & Plan Note (Signed)
Continue with current prescription therapy as reflected on the Med list. Chronic and severe. Chronic opioid use.  Potential benefits of a long term opioids use as well as potential risks (i.e. addiction risk, apnea etc) and complications (i.e. Somnolence, constipation and others) were explained to the patient and were aknowledged.

## 2012-04-24 NOTE — Assessment & Plan Note (Signed)
Better  

## 2012-04-24 NOTE — Assessment & Plan Note (Signed)
B LEs - recurrent - venous insufficiency Compr hose Lasix prn

## 2012-05-01 ENCOUNTER — Other Ambulatory Visit: Payer: Self-pay | Admitting: Internal Medicine

## 2012-05-03 ENCOUNTER — Ambulatory Visit: Payer: 59 | Admitting: Internal Medicine

## 2012-05-15 ENCOUNTER — Other Ambulatory Visit: Payer: Self-pay | Admitting: Internal Medicine

## 2012-05-25 ENCOUNTER — Other Ambulatory Visit: Payer: Self-pay | Admitting: Internal Medicine

## 2012-06-26 ENCOUNTER — Telehealth: Payer: Self-pay | Admitting: *Deleted

## 2012-06-26 MED ORDER — LORAZEPAM 2 MG PO TABS: 0.5000 mg | ORAL_TABLET | Freq: Four times a day (QID) | ORAL | Status: DC | PRN

## 2012-06-26 NOTE — Telephone Encounter (Signed)
OK to fill this prescription with additional refills x2 Thank you!  

## 2012-06-26 NOTE — Telephone Encounter (Signed)
Done

## 2012-06-26 NOTE — Telephone Encounter (Signed)
Rf req for Lorazepam 2 mg 1/2 po q 6 hours. # 60. Ok to Rf?

## 2012-07-03 ENCOUNTER — Telehealth: Payer: Self-pay | Admitting: Internal Medicine

## 2012-07-03 NOTE — Telephone Encounter (Signed)
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  States had knee replacement 06/13.  onset of bruising 06/29/12  when she got up on the bed and put all her weight on the knee, which she believes is torn again.  Concentrated to L knee; denies joint pain.  Is travelling out of the [place] in September [place].  Able to stand, and work, and move joint freely.  Per protocol, emergent symptoms denied; advised appt within 24 hours.  Patient unable to take appt earlier than 1600 07/04/12; appt sched 07/04/12 1615 with [name].

## 2012-07-04 ENCOUNTER — Other Ambulatory Visit (INDEPENDENT_AMBULATORY_CARE_PROVIDER_SITE_OTHER): Payer: 59

## 2012-07-04 ENCOUNTER — Ambulatory Visit (INDEPENDENT_AMBULATORY_CARE_PROVIDER_SITE_OTHER): Payer: 59 | Admitting: Internal Medicine

## 2012-07-04 ENCOUNTER — Encounter: Payer: Self-pay | Admitting: Internal Medicine

## 2012-07-04 VITALS — BP 152/90 | HR 80 | Temp 98.0°F | Resp 16 | Wt 226.0 lb

## 2012-07-04 DIAGNOSIS — I10 Essential (primary) hypertension: Secondary | ICD-10-CM

## 2012-07-04 DIAGNOSIS — M545 Low back pain, unspecified: Secondary | ICD-10-CM

## 2012-07-04 DIAGNOSIS — E119 Type 2 diabetes mellitus without complications: Secondary | ICD-10-CM

## 2012-07-04 DIAGNOSIS — M25569 Pain in unspecified knee: Secondary | ICD-10-CM

## 2012-07-04 DIAGNOSIS — N390 Urinary tract infection, site not specified: Secondary | ICD-10-CM

## 2012-07-04 DIAGNOSIS — M7989 Other specified soft tissue disorders: Secondary | ICD-10-CM

## 2012-07-04 DIAGNOSIS — F3289 Other specified depressive episodes: Secondary | ICD-10-CM

## 2012-07-04 DIAGNOSIS — F329 Major depressive disorder, single episode, unspecified: Secondary | ICD-10-CM

## 2012-07-04 LAB — URINALYSIS
Bilirubin Urine: NEGATIVE
Hgb urine dipstick: NEGATIVE
Ketones, ur: NEGATIVE
Leukocytes, UA: NEGATIVE
Nitrite: NEGATIVE
Specific Gravity, Urine: 1.01 (ref 1.000–1.030)
Total Protein, Urine: NEGATIVE
Urine Glucose: NEGATIVE
Urobilinogen, UA: 0.2 (ref 0.0–1.0)
pH: 6 (ref 5.0–8.0)

## 2012-07-04 LAB — HEMOGLOBIN A1C: Hgb A1c MFr Bld: 6.5 % (ref 4.6–6.5)

## 2012-07-04 MED ORDER — OXYCODONE HCL 20 MG PO TB12: 20.0000 mg | ORAL_TABLET | Freq: Two times a day (BID) | ORAL | Status: DC

## 2012-07-04 MED ORDER — ATOVAQUONE-PROGUANIL HCL 250-100 MG PO TABS: ORAL_TABLET | ORAL | Status: DC

## 2012-07-04 MED ORDER — OXYCODONE HCL 15 MG PO TABS: 15.0000 mg | ORAL_TABLET | ORAL | Status: DC | PRN

## 2012-07-04 MED ORDER — PROMETHAZINE HCL 25 MG PO TABS: 25.0000 mg | ORAL_TABLET | Freq: Three times a day (TID) | ORAL | Status: DC | PRN

## 2012-07-04 NOTE — Assessment & Plan Note (Signed)
Continue with current prescription therapy as reflected on the Med list.  

## 2012-07-04 NOTE — Assessment & Plan Note (Signed)
UA

## 2012-07-04 NOTE — Assessment & Plan Note (Addendum)
B severe OA  Chronic and severe. Chronic opioid use.  Potential benefits of a long term opioids use as well as potential risks (i.e. addiction risk, apnea etc) and complications (i.e. Somnolence, constipation and others) were explained to the patient and were aknowledged. 7/13 L knee contision - may need to aspirate/inject in the area of infrapatella bursa

## 2012-07-04 NOTE — Progress Notes (Signed)
Subjective:    Patient ID: [name], female    DOB: September 05, 1951, 61 y.o.   MRN: [id number]  Fall Pertinent negatives include no abdominal pain, headaches, nausea or numbness.   Fell on L knee Jul 10th - bad hematoma Last week kneeled on L knee - round swelling below L knee  The patient presents for a follow-up of  chronic hypertension, chronic dyslipidemia, type 2 diabetes controlled with medicines. F/u chronic LBP and B knee pain. C/o B LE swelling (has had it before, used compr hose)   BP Readings from Last 3 Encounters:  07/04/12 152/90  04/24/12 140/80  02/02/12 160/90   Wt Readings from Last 3 Encounters:  07/04/12 226 lb (102.513 kg)  04/24/12 217 lb (98.431 kg)  02/02/12 216 lb (97.977 kg)     Review of Systems  Constitutional: Positive for fatigue. Negative for chills, activity change, appetite change and unexpected weight change.  HENT: Negative for congestion, mouth sores and sinus pressure.   Eyes: Negative for visual disturbance.  Respiratory: Negative for cough and chest tightness.   Gastrointestinal: Negative for nausea and abdominal pain.  Genitourinary: Negative for frequency, difficulty urinating and vaginal pain.  Musculoskeletal: Positive for back pain, arthralgias and gait problem.  Skin: Negative for pallor and rash.  Neurological: Negative for dizziness, tremors, weakness, numbness and headaches.  Psychiatric/Behavioral: Positive for disturbed wake/sleep cycle and dysphoric mood. Negative for suicidal ideas and confusion. The patient is nervous/anxious.          Objective:   Physical Exam  Constitutional: She appears well-developed. No distress.       obese  HENT:  Head: Normocephalic.  Right Ear: External ear normal.  Left Ear: External ear normal.  Nose: Nose normal.  Mouth/Throat: Oropharynx is clear and moist.  Eyes: Conjunctivae are normal. Pupils are equal, round, and reactive to light. Right eye exhibits no discharge. Left eye  exhibits no discharge.  Neck: Normal range of motion. Neck supple. No JVD present. No tracheal deviation present. No thyromegaly present.  Cardiovascular: Normal rate, regular rhythm and normal heart sounds.   Pulmonary/Chest: No stridor. No respiratory distress. She has no wheezes.  Abdominal: Soft. Bowel sounds are normal. She exhibits no distension and no mass. There is no tenderness. There is no rebound and no guarding.  Musculoskeletal: She exhibits tenderness. She exhibits no edema.  Lymphadenopathy:    She has no cervical adenopathy.  Neurological: She displays normal reflexes. No cranial nerve deficit. She exhibits normal muscle tone. Coordination normal.  Skin: No rash noted. No erythema.  Psychiatric: Her behavior is normal. Judgment and thought content normal.       Tearful, depressed  Round bruised swelling below L knee cap  Procedure Note :    Procedure :   Point of care (POC) sonography examination   Indication:   Equipment used: Sonosite M-Turbo with HFL38x/13-6 MHz transducer linear probe. The images were stored in the unit and later transferred in storage.  The patient was placed in a sitting position.  This study revealed a mostly heteroechoic  >5 cm lesion with hypoecoic areas  below L knee cap   Impression: hematoma, infrapatella bursitis, contusion ---- L knee    Lab Results  Component Value Date   WBC 10.1 11/08/2011   HGB 12.6 11/08/2011   HCT 37.5 11/08/2011   PLT 224.0 11/08/2011   GLUCOSE 102* 02/02/2012   CHOL 199 11/08/2011   TRIG 89.0 11/08/2011   HDL 62.00 11/08/2011   LDLCALC  119* 11/08/2011   ALT 19 11/08/2011   AST 19 11/08/2011   NA 137 02/02/2012   K 4.6 02/02/2012   CL 98 02/02/2012   CREATININE 0.9 02/02/2012   BUN 22 02/02/2012   CO2 27 02/02/2012   TSH 2.47 11/08/2011   INR 1.5 02/05/2008   HGBA1C 6.7* 02/02/2012         Assessment & Plan:

## 2012-07-05 LAB — BASIC METABOLIC PANEL
BUN: 30 mg/dL — ABNORMAL HIGH (ref 6–23)
CO2: 31 mEq/L (ref 19–32)
Calcium: 9.6 mg/dL (ref 8.4–10.5)
Chloride: 94 mEq/L — ABNORMAL LOW (ref 96–112)
Creatinine, Ser: 1.1 mg/dL (ref 0.4–1.2)
GFR: 53.22 mL/min — ABNORMAL LOW (ref >60.00)
Glucose, Bld: 99 mg/dL (ref 70–99)
Potassium: 4 mEq/L (ref 3.5–5.1)
Sodium: 135 mEq/L (ref 135–145)

## 2012-07-17 ENCOUNTER — Other Ambulatory Visit: Payer: Self-pay | Admitting: *Deleted

## 2012-07-17 MED ORDER — OMEPRAZOLE 20 MG PO CPDR: 20.0000 mg | DELAYED_RELEASE_CAPSULE | Freq: Every day | ORAL | Status: DC

## 2012-07-31 ENCOUNTER — Encounter: Payer: Self-pay | Admitting: Gastroenterology

## 2012-07-31 ENCOUNTER — Telehealth: Payer: Self-pay | Admitting: Internal Medicine

## 2012-07-31 ENCOUNTER — Other Ambulatory Visit: Payer: Self-pay | Admitting: *Deleted

## 2012-07-31 MED ORDER — GLIMEPIRIDE 1 MG PO TABS: 1.0000 mg | ORAL_TABLET | Freq: Every day | ORAL | Status: DC

## 2012-07-31 NOTE — Telephone Encounter (Signed)
Not with the type of colitis that she had. Should bring imodium in case of Travers diarrhea

## 2012-07-31 NOTE — Telephone Encounter (Signed)
Spoke with pt and she is aware.

## 2012-07-31 NOTE — Telephone Encounter (Signed)
Error - wrong provider

## 2012-07-31 NOTE — Telephone Encounter (Signed)
Pt has history of colitis. She is going on vacation to [place] and wants to know if [name] thinks she needs to take an antibiotic just to be on the safe side. Please advise.

## 2012-08-01 ENCOUNTER — Other Ambulatory Visit: Payer: Self-pay | Admitting: Internal Medicine

## 2012-08-02 ENCOUNTER — Ambulatory Visit: Payer: 59 | Admitting: Internal Medicine

## 2012-08-07 ENCOUNTER — Other Ambulatory Visit: Payer: Self-pay | Admitting: Internal Medicine

## 2012-09-12 ENCOUNTER — Encounter: Payer: Self-pay | Admitting: Internal Medicine

## 2012-09-12 ENCOUNTER — Ambulatory Visit (INDEPENDENT_AMBULATORY_CARE_PROVIDER_SITE_OTHER)
Admission: RE | Admit: 2012-09-12 | Discharge: 2012-09-12 | Disposition: A | Discharge status: Home / Self Care | Payer: 59 | Source: Ambulatory Visit | Attending: Internal Medicine | Admitting: Internal Medicine

## 2012-09-12 ENCOUNTER — Ambulatory Visit (INDEPENDENT_AMBULATORY_CARE_PROVIDER_SITE_OTHER): Payer: 59 | Admitting: Internal Medicine

## 2012-09-12 VITALS — BP 124/82 | HR 82 | Temp 97.9°F | Ht 63.0 in | Wt 228.5 lb

## 2012-09-12 DIAGNOSIS — R609 Edema, unspecified: Secondary | ICD-10-CM

## 2012-09-12 DIAGNOSIS — M25519 Pain in unspecified shoulder: Secondary | ICD-10-CM

## 2012-09-12 DIAGNOSIS — M25569 Pain in unspecified knee: Secondary | ICD-10-CM

## 2012-09-12 DIAGNOSIS — M25511 Pain in right shoulder: Secondary | ICD-10-CM

## 2012-09-12 DIAGNOSIS — I10 Essential (primary) hypertension: Secondary | ICD-10-CM

## 2012-09-12 NOTE — Patient Instructions (Addendum)
Continue all other medications as before Please go to XRAY in the Basement for the x-ray test You will be contacted by phone if any changes need to be made immediately.  Otherwise, you will receive a letter about your results with an explanation. Please remember to sign up for My Chart at your earliest convenience, as this will be important to you in the future with finding out test results. You will be contacted regarding the referral for: orthopedic

## 2012-09-12 NOTE — Assessment & Plan Note (Signed)
stable overall by hx and exam, most recent data reviewed with pt, and pt to continue medical treatment as before BP Readings from Last 3 Encounters:  09/12/12 124/82  07/04/12 152/90  04/24/12 140/80

## 2012-09-12 NOTE — Assessment & Plan Note (Signed)
Trace LLE possibly related to recent fall and current anterior left knee hematoma, benign, ok to cont meds as is, reassured

## 2012-09-12 NOTE — Progress Notes (Signed)
Subjective:    Patient ID: [name], female    DOB: 02/14/1951, 61 y.o.   MRN: [id number]  HPI  Here to f/u; somehow tripped and fell 3 wks ago in [place] - has periph neuropathy and may have tripped on a backpack, but fell to right shoulder (not head); also had a less dramatic fall to the left knee going up poorly constructed stairs to the left knee 5 days later - is s/p left TKR, has some swelling and bruise still evident; with pain persitent to both shoulder and knee but unable to get med attention until today; has seen Dr Wynelle Link for the knee, some uncomfortable and tight but no other more severe pain, no giveaway, no falls since then.  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  Pt denies chest pain, increased sob or doe, wheezing, orthopnea, PND, increased LE swelling, palpitations, dizziness or syncope.Pt denies new neurological symptoms such as new headache, or facial or extremity weakness or numbness   Pt denies polydipsia, polyuria. Past Medical History  Diagnosis Date  . Diabetes mellitus     type II  . GERD (gastroesophageal reflux disease)   . Hypertension   . IBS (irritable bowel syndrome)   . Depression   . Anxiety   . LBP (low back pain)   . Osteoarthritis   . Colitis 2010    microscopic- Dr Henrene Pastor  . Renal insufficiency 2011  . Osteopenia    Past Surgical History  Procedure Date  . Foramen magnum ependymoma surgery 2003    Dr Rita Ohara  . Total knee arthroplasty     L 2008, R 2009- Dr Maureen Ralphs    reports that she quit smoking about 23 years ago. She does not have any smokeless tobacco history on file. She reports that she does not drink alcohol or use illicit drugs. family history includes Crohn's disease in her maternal uncle; Depression in her  mother; Diabetes in her other; Hypertension in her mother; and Stroke (age of onset:85) in her mother. Allergies  Allergen Reactions  . Erythromycin    Current Outpatient Prescriptions on File Prior to Visit  Medication Sig Dispense Refill  . atovaquone-proguanil (MALARONE) 250-100 MG TABS 1 po qd. Start 1-2 d prior to departure. Continue daily while on your trip and for1 wk after arrival back home.  30 tablet  1  . buPROPion (WELLBUTRIN SR) 150 MG 12 hr tablet TAKE 1 TABLET (150 MG TOTAL) BY MOUTH 2 (TWO) TIMES DAILY.  180 tablet  1  . carvedilol (COREG) 12.5 MG tablet Take 1 tablet (12.5 mg total) by mouth 2 (two) times daily.  180 tablet  1  . cevimeline (EVOXAC) 30 MG capsule Take 1 capsule (30 mg total) by mouth 3 (three) times daily.  270 capsule  3  . Cholecalciferol (EQL VITAMIN D3) 1000 UNITS tablet Take 1,000 Units by mouth daily.        . cyclobenzaprine (FLEXERIL) 10 MG tablet TAKE 1 THREE TIMES A DAY AS NEEDED FOR BACK PAIN  270 tablet  0  . CYMBALTA 60 MG capsule TAKE 1 CAPSULE BY MOUTH DAILY  30 capsule  5  . diphenoxylate-atropine (LOMOTIL) 2.5-0.025 MG per tablet Take by mouth. 1-2 tabs q 4-6 hrs prn       .  estradiol (ESTRACE) 0.5 MG tablet Take 0.5 mg by mouth every other day.        . furosemide (LASIX) 40 MG tablet Take 0.5-1 tablets (20-40 mg total) by mouth daily as needed.  30 tablet  5  . glimepiride (AMARYL) 1 MG tablet Take 1 tablet (1 mg total) by mouth daily before breakfast.  90 tablet  2  . glucose blood (ONE TOUCH TEST STRIPS) test strip Use as instructed  100 each  3  . ibuprofen (ADVIL,MOTRIN) 600 MG tablet Take 600 mg by mouth as needed.        . Lancets (ONETOUCH ULTRASOFT) lancets Use as instructed  100 each  3  . lidocaine (LIDODERM) 5 % Place 1 patch onto the skin as directed. 1-3 patches daily  30 patch  2  . LORazepam (ATIVAN) 2 MG tablet Take 0.5 tablets (1 mg total) by mouth every 6 (six) hours as needed for anxiety.  60 tablet  2  .  medroxyPROGESTERone (PROVERA) 2.5 MG tablet Take 2.5 mg by mouth every other day.        . metFORMIN (GLUCOPHAGE) 1000 MG tablet TAKE 1 TABLET BY MOUTH 2 TIMES DAILY WITH A MEAL.  180 tablet  3  . NON FORMULARY [employer]-1 Bid       . omeprazole (PRILOSEC) 20 MG capsule Take 1 capsule (20 mg total) by mouth daily.  90 capsule  1  . oxybutynin (DITROPAN) 5 MG tablet       . oxyCODONE (OXYCONTIN) 20 MG 12 hr tablet Take 1 tablet (20 mg total) by mouth 2 (two) times daily. Please fill on or after 09/16/12  60 tablet  0  . oxyCODONE (ROXICODONE) 15 MG immediate release tablet Take 1 tablet (15 mg total) by mouth every 4 (four) hours as needed. Fill on or after 09/16/12  120 tablet  0  . polycarbophil (FIBERCON) 625 MG tablet Take 4 tablets by mouth daily.        . promethazine (PHENERGAN) 25 MG tablet Take 1 tablet (25 mg total) by mouth every 8 (eight) hours as needed for nausea.  30 tablet  1   Review of Systems  Constitutional: Negative for diaphoresis and unexpected weight change.  HENT: Negative for tinnitus.   Eyes: Negative for photophobia and visual disturbance.  Respiratory: Negative for choking and stridor.   Gastrointestinal: Negative for vomiting and blood in stool.  Genitourinary: Negative for hematuria and decreased urine volume.  Musculoskeletal: Negative for gait problem.  Skin: Negative for color change and wound.  Neurological: Negative for tremors and numbness.  Psychiatric/Behavioral: Negative for decreased concentration. The patient is not hyperactive.       Objective:   Physical Exam BP 124/82  Pulse 82  Temp 97.9 F (36.6 C) (Oral)  Ht 5\' 3"  (1.6 m)  Wt 228 lb 8 oz (103.647 kg)  BMI 40.48 kg/m2  SpO2 96% Physical Exam  VS noted Constitutional: Pt appears well-developed and well-nourished.  HENT: Head: Normocephalic.  Right Ear: External ear normal.  Left Ear: External ear normal.  Eyes: Conjunctivae and EOM are normal. Pupils are equal, round, and reactive to  light.  Neck: Normal range of motion. Neck supple.  Cardiovascular: Normal rate and regular rhythm.   Pulmonary/Chest: Effort normal and breath sounds normal.  Neurological: Pt is alert. Not confused  Skin: Skin is warm. No erythema.  Psychiatric: Pt behavior is normal. Thought content normal. 1+ nervous Right shoulder FROM with tender at the subacromial bursa area  Right knee with 1+ prepatellar hematoma/swelling with mild tender, o/w FROM but with trace leg edema below knee as well o/w neurovasc intact    Assessment & Plan:

## 2012-09-12 NOTE — Assessment & Plan Note (Signed)
By hx and exam c/w prob bursitis post fall - declines steroid pack, has pain med at home, for film, and refer to ortho for further eval and tx

## 2012-09-12 NOTE — Assessment & Plan Note (Signed)
stable overall by hx and exam, , and pt to continue medical treatment as before   

## 2012-09-13 ENCOUNTER — Other Ambulatory Visit: Payer: Self-pay | Admitting: Internal Medicine

## 2012-09-15 NOTE — Telephone Encounter (Signed)
Ok to Rf? 

## 2012-10-04 ENCOUNTER — Ambulatory Visit (INDEPENDENT_AMBULATORY_CARE_PROVIDER_SITE_OTHER): Payer: Self-pay | Admitting: Family Medicine

## 2012-10-04 DIAGNOSIS — E119 Type 2 diabetes mellitus without complications: Secondary | ICD-10-CM

## 2012-10-04 NOTE — Progress Notes (Signed)
Patient presents today for 3 month DM follow-up as part of the employer-sponsored Link to  Wellness program.  Medications, glucose readings, and lifestyle (diet and exercise) have been reviewed.  Details of this visit can be found in [employer] documenting software through [employer] [facility]).  Patient has set a series of personal goals and will follow-up in 3 months for further review.

## 2012-10-11 ENCOUNTER — Encounter: Payer: Self-pay | Admitting: Family Medicine

## 2012-10-11 NOTE — Progress Notes (Signed)
Patient ID: [name], female   DOB: March 19, 1951, 61 y.o.   MRN: [id number] Reviewed and agree with documentation.

## 2012-10-12 ENCOUNTER — Other Ambulatory Visit: Payer: Self-pay | Admitting: *Deleted

## 2012-10-12 MED ORDER — CARVEDILOL 12.5 MG PO TABS: 12.5000 mg | ORAL_TABLET | Freq: Two times a day (BID) | ORAL | Status: DC

## 2012-10-12 NOTE — Telephone Encounter (Signed)
R'cd fax from [place] for refill of Carvedilol.

## 2012-10-18 ENCOUNTER — Other Ambulatory Visit (INDEPENDENT_AMBULATORY_CARE_PROVIDER_SITE_OTHER): Payer: 59

## 2012-10-18 ENCOUNTER — Other Ambulatory Visit: Payer: Self-pay | Admitting: *Deleted

## 2012-10-18 ENCOUNTER — Encounter: Payer: Self-pay | Admitting: Internal Medicine

## 2012-10-18 ENCOUNTER — Ambulatory Visit (INDEPENDENT_AMBULATORY_CARE_PROVIDER_SITE_OTHER): Payer: 59 | Admitting: Internal Medicine

## 2012-10-18 VITALS — BP 124/80 | HR 81 | Temp 98.3°F | Resp 16 | Ht 62.5 in | Wt 217.8 lb

## 2012-10-18 DIAGNOSIS — I10 Essential (primary) hypertension: Secondary | ICD-10-CM

## 2012-10-18 DIAGNOSIS — M2550 Pain in unspecified joint: Secondary | ICD-10-CM

## 2012-10-18 DIAGNOSIS — E875 Hyperkalemia: Secondary | ICD-10-CM

## 2012-10-18 DIAGNOSIS — M545 Low back pain, unspecified: Secondary | ICD-10-CM

## 2012-10-18 DIAGNOSIS — E119 Type 2 diabetes mellitus without complications: Secondary | ICD-10-CM

## 2012-10-18 DIAGNOSIS — M25569 Pain in unspecified knee: Secondary | ICD-10-CM

## 2012-10-18 LAB — BASIC METABOLIC PANEL
BUN: 21 mg/dL (ref 6–23)
CO2: 30 mEq/L (ref 19–32)
Calcium: 9.1 mg/dL (ref 8.4–10.5)
Chloride: 101 mEq/L (ref 96–112)
Creatinine, Ser: 0.9 mg/dL (ref 0.4–1.2)
GFR: 66.03 mL/min (ref >60.00)
Glucose, Bld: 97 mg/dL (ref 70–99)
Potassium: 4.1 mEq/L (ref 3.5–5.1)
Sodium: 138 mEq/L (ref 135–145)

## 2012-10-18 LAB — HEMOGLOBIN A1C: Hgb A1c MFr Bld: 6.5 % (ref 4.6–6.5)

## 2012-10-18 MED ORDER — OXYCODONE HCL 15 MG PO TABS: 15.0000 mg | ORAL_TABLET | ORAL | Status: DC | PRN

## 2012-10-18 MED ORDER — FUROSEMIDE 40 MG PO TABS: 20.0000 mg | ORAL_TABLET | Freq: Every day | ORAL | Status: DC | PRN

## 2012-10-18 MED ORDER — CARVEDILOL 12.5 MG PO TABS: 12.5000 mg | ORAL_TABLET | Freq: Two times a day (BID) | ORAL | Status: DC

## 2012-10-18 MED ORDER — OMEPRAZOLE 20 MG PO CPDR: 20.0000 mg | DELAYED_RELEASE_CAPSULE | Freq: Every day | ORAL | Status: DC

## 2012-10-18 MED ORDER — OXYCODONE HCL 20 MG PO TB12: 20.0000 mg | ORAL_TABLET | Freq: Two times a day (BID) | ORAL | Status: DC

## 2012-10-18 MED ORDER — METFORMIN HCL 1000 MG PO TABS: 1000.0000 mg | ORAL_TABLET | Freq: Two times a day (BID) | ORAL | Status: DC

## 2012-10-18 MED ORDER — BUPROPION HCL ER (SR) 150 MG PO TB12: 150.0000 mg | ORAL_TABLET | Freq: Two times a day (BID) | ORAL | Status: DC

## 2012-10-18 MED ORDER — LIDOCAINE 5 % EX PTCH: 1.0000 | MEDICATED_PATCH | CUTANEOUS | Status: DC

## 2012-10-18 NOTE — Assessment & Plan Note (Signed)
Continue with current prescription therapy as reflected on the Med list.  

## 2012-10-18 NOTE — Progress Notes (Signed)
   Subjective:    Patient ID: [name], female    DOB: 1951-06-25, 61 y.o.   MRN: [id number]  HPI    The patient presents for a follow-up of  chronic hypertension, chronic dyslipidemia, type 2 diabetes controlled with medicines. F/u chronic LBP and B knee pain. C/o B LE swelling (has had it before, used compr hose)   BP Readings from Last 3 Encounters:  10/18/12 124/80  09/12/12 124/82  07/04/12 152/90   Wt Readings from Last 3 Encounters:  10/18/12 217 lb 12 oz (98.771 kg)  09/12/12 228 lb 8 oz (103.647 kg)  07/04/12 226 lb (102.513 kg)     Review of Systems  Constitutional: Positive for fatigue. Negative for chills, activity change, appetite change and unexpected weight change.  HENT: Negative for congestion, mouth sores and sinus pressure.   Eyes: Negative for visual disturbance.  Respiratory: Negative for cough and chest tightness.   Genitourinary: Negative for frequency, difficulty urinating and vaginal pain.  Musculoskeletal: Positive for back pain, arthralgias and gait problem.  Skin: Negative for pallor and rash.  Neurological: Negative for dizziness, tremors and weakness.  Psychiatric/Behavioral: Positive for sleep disturbance and dysphoric mood. Negative for suicidal ideas and confusion. The patient is nervous/anxious.          Objective:   Physical Exam  Constitutional: She appears well-developed. No distress.       obese  HENT:  Head: Normocephalic.  Right Ear: External ear normal.  Left Ear: External ear normal.  Nose: Nose normal.  Mouth/Throat: Oropharynx is clear and moist.  Eyes: Conjunctivae normal are normal. Pupils are equal, round, and reactive to light. Right eye exhibits no discharge. Left eye exhibits no discharge.  Neck: Normal range of motion. Neck supple. No JVD present. No tracheal deviation present. No thyromegaly present.  Cardiovascular: Normal rate, regular rhythm and normal heart sounds.   Pulmonary/Chest: No stridor. No  respiratory distress. She has no wheezes.  Abdominal: Soft. Bowel sounds are normal. She exhibits no distension and no mass. There is no tenderness. There is no rebound and no guarding.  Musculoskeletal: She exhibits tenderness. She exhibits no edema.  Lymphadenopathy:    She has no cervical adenopathy.  Neurological: She displays normal reflexes. No cranial nerve deficit. She exhibits normal muscle tone. Coordination normal.  Skin: No rash noted. No erythema.  Psychiatric: Her behavior is normal. Judgment and thought content normal.       Tearful, depressed      Lab Results  Component Value Date   WBC 10.1 11/08/2011   HGB 12.6 11/08/2011   HCT 37.5 11/08/2011   PLT 224.0 11/08/2011   GLUCOSE 97 10/18/2012   CHOL 199 11/08/2011   TRIG 89.0 11/08/2011   HDL 62.00 11/08/2011   LDLCALC 119* 11/08/2011   ALT 19 11/08/2011   AST 19 11/08/2011   NA 138 10/18/2012   K 4.1 10/18/2012   CL 101 10/18/2012   CREATININE 0.9 10/18/2012   BUN 21 10/18/2012   CO2 30 10/18/2012   TSH 2.47 11/08/2011   INR 1.5 02/05/2008   HGBA1C 6.5 10/18/2012         Assessment & Plan:

## 2012-10-23 ENCOUNTER — Other Ambulatory Visit: Payer: Self-pay | Admitting: Internal Medicine

## 2012-10-23 NOTE — Telephone Encounter (Signed)
Pt was here last week.  She has lost the paper that had prescriptions for Welbutrin, Roxicodone and Oxycontin.  She would like another copy.

## 2012-10-23 NOTE — Telephone Encounter (Signed)
Ok to restore Thx

## 2012-10-24 NOTE — Telephone Encounter (Signed)
Called pt to clarify which Rxs she lost. She was given 3 individual Rxs for each med. I need to know what fill date the Rxs were that she lost. Left mess to call back.

## 2012-10-26 NOTE — Telephone Encounter (Signed)
Left detailed mess requesting more info as to what is needed.

## 2012-10-27 MED ORDER — BUPROPION HCL ER (SR) 150 MG PO TB12: 150.0000 mg | ORAL_TABLET | Freq: Two times a day (BID) | ORAL | Status: DC

## 2012-10-27 MED ORDER — OXYCODONE HCL 20 MG PO TB12: 20.0000 mg | ORAL_TABLET | Freq: Two times a day (BID) | ORAL | Status: DC

## 2012-10-27 MED ORDER — OXYCODONE HCL 15 MG PO TABS: 15.0000 mg | ORAL_TABLET | ORAL | Status: DC | PRN

## 2012-10-27 NOTE — Telephone Encounter (Signed)
Rxs printed. Left mess for patient to call back.   Pt left vm stating she only needs the Roxicodone 15mg  and Oxycodone 20 mg and Wellbutrin Rxs for November

## 2012-12-20 ENCOUNTER — Telehealth: Payer: Self-pay | Admitting: *Deleted

## 2012-12-20 NOTE — Telephone Encounter (Signed)
Rf req for Lorazepam 2 mg 1/2 po q 6 prn. # 60 last filled 10/24/12. Ok to Rf?

## 2012-12-20 NOTE — Telephone Encounter (Signed)
OK to fill this prescription with additional refills x3 Thank you!  

## 2012-12-21 MED ORDER — LORAZEPAM 2 MG PO TABS: 0.5000 mg | ORAL_TABLET | Freq: Four times a day (QID) | ORAL | Status: DC | PRN

## 2012-12-21 NOTE — Telephone Encounter (Signed)
rx called in to [place]

## 2012-12-28 ENCOUNTER — Other Ambulatory Visit (HOSPITAL_COMMUNITY): Payer: Self-pay | Admitting: Neurosurgery

## 2012-12-28 DIAGNOSIS — Z86011 Personal history of benign neoplasm of the brain: Secondary | ICD-10-CM

## 2013-01-11 ENCOUNTER — Ambulatory Visit (HOSPITAL_COMMUNITY)
Admission: RE | Admit: 2013-01-11 | Discharge: 2013-01-11 | Disposition: A | Discharge status: Home / Self Care | Payer: 59 | Source: Ambulatory Visit | Attending: Neurosurgery | Admitting: Neurosurgery

## 2013-01-11 DIAGNOSIS — Z9181 History of falling: Secondary | ICD-10-CM | POA: Insufficient documentation

## 2013-01-11 DIAGNOSIS — R42 Dizziness and giddiness: Secondary | ICD-10-CM | POA: Insufficient documentation

## 2013-01-11 DIAGNOSIS — Z86011 Personal history of benign neoplasm of the brain: Secondary | ICD-10-CM | POA: Insufficient documentation

## 2013-01-11 MED ORDER — GADOBENATE DIMEGLUMINE 529 MG/ML IV SOLN
10.0000 mL | Freq: Once | INTRAVENOUS | Status: AC | PRN
  Administered 2013-01-11: 10 mL via INTRAVENOUS

## 2013-01-11 MED ORDER — GADOBENATE DIMEGLUMINE 529 MG/ML IV SOLN: 20.0000 mL | Freq: Once | INTRAVENOUS | Status: DC | PRN

## 2013-01-12 LAB — POCT I-STAT, CHEM 8
BUN: 40 mg/dL — ABNORMAL HIGH (ref 6–23)
Calcium, Ion: 1.17 mmol/L (ref 1.13–1.30)
Chloride: 101 mEq/L (ref 96–112)
Creatinine, Ser: 1.3 mg/dL — ABNORMAL HIGH (ref 0.50–1.10)
Glucose, Bld: 137 mg/dL — ABNORMAL HIGH (ref 70–99)
HCT: 39 % (ref 36.0–46.0)
Hemoglobin: 13.3 g/dL (ref 12.0–15.0)
Potassium: 3.9 mEq/L (ref 3.5–5.1)
Sodium: 137 mEq/L (ref 135–145)
TCO2: 28 mmol/L (ref 0–100)

## 2013-01-15 ENCOUNTER — Ambulatory Visit (HOSPITAL_COMMUNITY): Admission: RE | Admit: 2013-01-15 | Payer: 59 | Source: Ambulatory Visit

## 2013-01-16 ENCOUNTER — Telehealth: Payer: Self-pay | Admitting: Internal Medicine

## 2013-01-16 NOTE — Telephone Encounter (Signed)
Ok Thx 

## 2013-01-16 NOTE — Telephone Encounter (Signed)
Pt is req refill for Oxycontin 20mg  bid and Oxycodone 15mg  bid for the month of February, March and April. Pt stated that her next appt is not until 04/18/13. Please call pt if this is ok.

## 2013-01-19 MED ORDER — OXYCODONE HCL 15 MG PO TABS: 15.0000 mg | ORAL_TABLET | ORAL | Status: DC | PRN

## 2013-01-19 MED ORDER — OXYCODONE HCL 20 MG PO TB12: 20.0000 mg | ORAL_TABLET | Freq: Two times a day (BID) | ORAL | Status: DC

## 2013-01-19 NOTE — Telephone Encounter (Signed)
Rxs printed. Left detailed mess informing pt to p/u later today.

## 2013-01-22 ENCOUNTER — Other Ambulatory Visit: Payer: Self-pay | Admitting: *Deleted

## 2013-01-22 MED ORDER — DULOXETINE HCL 60 MG PO CPEP: 60.0000 mg | ORAL_CAPSULE | Freq: Every day | ORAL | Status: DC

## 2013-02-09 ENCOUNTER — Ambulatory Visit (INDEPENDENT_AMBULATORY_CARE_PROVIDER_SITE_OTHER)
Admission: RE | Admit: 2013-02-09 | Discharge: 2013-02-09 | Disposition: A | Discharge status: Home / Self Care | Payer: 59 | Source: Ambulatory Visit | Attending: Internal Medicine | Admitting: Internal Medicine

## 2013-02-09 ENCOUNTER — Ambulatory Visit (INDEPENDENT_AMBULATORY_CARE_PROVIDER_SITE_OTHER): Payer: 59 | Admitting: Internal Medicine

## 2013-02-09 ENCOUNTER — Encounter: Payer: Self-pay | Admitting: Internal Medicine

## 2013-02-09 VITALS — BP 122/90 | HR 80 | Temp 98.3°F | Resp 16 | Wt 234.0 lb

## 2013-02-09 DIAGNOSIS — M545 Low back pain, unspecified: Secondary | ICD-10-CM

## 2013-02-09 DIAGNOSIS — N259 Disorder resulting from impaired renal tubular function, unspecified: Secondary | ICD-10-CM

## 2013-02-09 DIAGNOSIS — M79609 Pain in unspecified limb: Secondary | ICD-10-CM

## 2013-02-09 DIAGNOSIS — M79642 Pain in left hand: Secondary | ICD-10-CM

## 2013-02-09 DIAGNOSIS — I10 Essential (primary) hypertension: Secondary | ICD-10-CM

## 2013-02-09 DIAGNOSIS — E119 Type 2 diabetes mellitus without complications: Secondary | ICD-10-CM

## 2013-02-09 DIAGNOSIS — F411 Generalized anxiety disorder: Secondary | ICD-10-CM

## 2013-02-09 DIAGNOSIS — K117 Disturbances of salivary secretion: Secondary | ICD-10-CM

## 2013-02-09 MED ORDER — MIRABEGRON ER 50 MG PO TB24: 50.0000 mg | ORAL_TABLET | Freq: Every day | ORAL | Status: DC

## 2013-02-09 NOTE — Assessment & Plan Note (Signed)
Continue with current prescription therapy as reflected on the Med list.  

## 2013-02-09 NOTE — Assessment & Plan Note (Addendum)
2/14 MCP #2,3 Xray May need RA labs

## 2013-02-09 NOTE — Progress Notes (Signed)
   Subjective:     Hand Pain  There was no injury mechanism. The pain is present in the left hand. The quality of the pain is described as aching. The pain radiates to the left hand. The pain is moderate. The pain has been fluctuating since the incident. The symptoms are aggravated by movement.      The patient presents for a follow-up of  chronic hypertension, chronic dyslipidemia, type 2 diabetes controlled with medicines. F/u chronic LBP and B knee pain. C/o B LE swelling (has had it before, used compr hose)   BP Readings from Last 3 Encounters:  02/09/13 122/90  10/18/12 124/80  09/12/12 124/82   Wt Readings from Last 3 Encounters:  02/09/13 234 lb (106.142 kg)  10/18/12 217 lb 12 oz (98.771 kg)  09/12/12 228 lb 8 oz (103.647 kg)     Review of Systems  Constitutional: Positive for fatigue. Negative for chills, activity change, appetite change and unexpected weight change.  HENT: Negative for congestion, mouth sores and sinus pressure.   Eyes: Negative for visual disturbance.  Respiratory: Negative for cough and chest tightness.   Genitourinary: Negative for frequency, difficulty urinating and vaginal pain.  Musculoskeletal: Positive for back pain, arthralgias and gait problem.  Skin: Negative for pallor and rash.  Neurological: Negative for dizziness, tremors and weakness.  Psychiatric/Behavioral: Positive for sleep disturbance and dysphoric mood. Negative for suicidal ideas and confusion. The patient is nervous/anxious.          Objective:   Physical Exam  Constitutional: She appears well-developed. No distress.  obese  HENT:  Head: Normocephalic.  Right Ear: External ear normal.  Left Ear: External ear normal.  Nose: Nose normal.  Mouth/Throat: Oropharynx is clear and moist.  Eyes: Conjunctivae are normal. Pupils are equal, round, and reactive to light. Right eye exhibits no discharge. Left eye exhibits no discharge.  Neck: Normal range of motion. Neck supple.  No JVD present. No tracheal deviation present. No thyromegaly present.  Cardiovascular: Normal rate, regular rhythm and normal heart sounds.   Pulmonary/Chest: No stridor. No respiratory distress. She has no wheezes.  Abdominal: Soft. Bowel sounds are normal. She exhibits no distension and no mass. There is no tenderness. There is no rebound and no guarding.  Musculoskeletal: She exhibits tenderness. She exhibits no edema.  L hand MCP #2,3 are swollen and tender  Lymphadenopathy:    She has no cervical adenopathy.  Neurological: She displays normal reflexes. No cranial nerve deficit. She exhibits normal muscle tone. Coordination normal.  Skin: No rash noted. No erythema.  Psychiatric: Her behavior is normal. Judgment and thought content normal.  Tearful, depressed      Lab Results  Component Value Date   WBC 10.1 11/08/2011   HGB 13.3 01/11/2013   HCT 39.0 01/11/2013   PLT 224.0 11/08/2011   GLUCOSE 137* 01/11/2013   CHOL 199 11/08/2011   TRIG 89.0 11/08/2011   HDL 62.00 11/08/2011   LDLCALC 119* 11/08/2011   ALT 19 11/08/2011   AST 19 11/08/2011   NA 137 01/11/2013   K 3.9 01/11/2013   CL 101 01/11/2013   CREATININE 1.30* 01/11/2013   BUN 40* 01/11/2013   CO2 30 10/18/2012   TSH 2.47 11/08/2011   INR 1.5 02/05/2008   HGBA1C 6.5 10/18/2012         Assessment & Plan:

## 2013-02-09 NOTE — Assessment & Plan Note (Signed)
D/c oxybutinin

## 2013-02-09 NOTE — Patient Instructions (Signed)
Keep ROV in April

## 2013-02-09 NOTE — Assessment & Plan Note (Signed)
labs

## 2013-02-11 ENCOUNTER — Telehealth: Payer: Self-pay | Admitting: Internal Medicine

## 2013-02-11 NOTE — Telephone Encounter (Signed)
Erline Levine, please, inform patient that the hand Xray did not show any changes in the joints that were swollen  Please, mail the xray to the patient or ask her to sign up for mychart Thx

## 2013-02-12 ENCOUNTER — Other Ambulatory Visit: Payer: Self-pay | Admitting: Internal Medicine

## 2013-02-13 NOTE — Telephone Encounter (Signed)
Left detailed mess informing pt of below. Copies mailed.

## 2013-03-01 ENCOUNTER — Telehealth: Payer: Self-pay | Admitting: Internal Medicine

## 2013-03-01 NOTE — Telephone Encounter (Signed)
Ok Thx 

## 2013-03-01 NOTE — Telephone Encounter (Signed)
Ms. Chock hand is still sore and swollen.  She wants to be referred to a hand specialist at [place].

## 2013-03-02 NOTE — Telephone Encounter (Signed)
LMOM to let patient know [facility] will call her.

## 2013-03-05 ENCOUNTER — Other Ambulatory Visit: Payer: Self-pay | Admitting: Internal Medicine

## 2013-04-18 ENCOUNTER — Other Ambulatory Visit: Payer: Self-pay | Admitting: Internal Medicine

## 2013-04-18 ENCOUNTER — Encounter: Payer: Self-pay | Admitting: Internal Medicine

## 2013-04-18 ENCOUNTER — Ambulatory Visit (INDEPENDENT_AMBULATORY_CARE_PROVIDER_SITE_OTHER): Payer: Self-pay | Admitting: Family Medicine

## 2013-04-18 ENCOUNTER — Ambulatory Visit (INDEPENDENT_AMBULATORY_CARE_PROVIDER_SITE_OTHER): Payer: 59 | Admitting: Internal Medicine

## 2013-04-18 VITALS — BP 130/80 | HR 84 | Temp 97.9°F | Resp 16 | Wt 229.0 lb

## 2013-04-18 VITALS — BP 118/76 | Wt 227.0 lb

## 2013-04-18 DIAGNOSIS — F3289 Other specified depressive episodes: Secondary | ICD-10-CM

## 2013-04-18 DIAGNOSIS — I83893 Varicose veins of bilateral lower extremities with other complications: Secondary | ICD-10-CM

## 2013-04-18 DIAGNOSIS — E119 Type 2 diabetes mellitus without complications: Secondary | ICD-10-CM

## 2013-04-18 DIAGNOSIS — I10 Essential (primary) hypertension: Secondary | ICD-10-CM

## 2013-04-18 DIAGNOSIS — M1990 Unspecified osteoarthritis, unspecified site: Secondary | ICD-10-CM

## 2013-04-18 DIAGNOSIS — F329 Major depressive disorder, single episode, unspecified: Secondary | ICD-10-CM

## 2013-04-18 DIAGNOSIS — K117 Disturbances of salivary secretion: Secondary | ICD-10-CM

## 2013-04-18 MED ORDER — OXYCODONE HCL 20 MG PO TB12: 20.0000 mg | ORAL_TABLET | Freq: Two times a day (BID) | ORAL | Status: DC

## 2013-04-18 MED ORDER — OXYCODONE HCL 15 MG PO TABS: 15.0000 mg | ORAL_TABLET | ORAL | Status: DC | PRN

## 2013-04-18 NOTE — Progress Notes (Signed)
   Subjective:     Hand Pain  There was no injury mechanism. The pain is present in the left hand. The quality of the pain is described as aching. The pain radiates to the left hand. The pain is moderate. The pain has been fluctuating since the incident. The symptoms are aggravated by movement.      The patient presents for a follow-up of  chronic hypertension, chronic dyslipidemia, type 2 diabetes controlled with medicines. F/u chronic LBP and B knee pain. C/o B LE swelling (has had it before, used compr hose) A1c 6.6% today  BP Readings from Last 3 Encounters:  04/18/13 130/80  02/09/13 122/90  10/18/12 124/80   Wt Readings from Last 3 Encounters:  04/18/13 229 lb (103.874 kg)  02/09/13 234 lb (106.142 kg)  10/18/12 217 lb 12 oz (98.771 kg)     Review of Systems  Constitutional: Positive for fatigue. Negative for chills, activity change, appetite change and unexpected weight change.  HENT: Negative for congestion, mouth sores and sinus pressure.   Eyes: Negative for visual disturbance.  Respiratory: Negative for cough and chest tightness.   Genitourinary: Negative for frequency, difficulty urinating and vaginal pain.  Musculoskeletal: Positive for back pain, arthralgias and gait problem.  Skin: Negative for pallor and rash.  Neurological: Negative for dizziness, tremors and weakness.  Psychiatric/Behavioral: Positive for sleep disturbance and dysphoric mood. Negative for suicidal ideas and confusion. The patient is nervous/anxious.          Objective:   Physical Exam  Constitutional: She appears well-developed. No distress.  obese  HENT:  Head: Normocephalic.  Right Ear: External ear normal.  Left Ear: External ear normal.  Nose: Nose normal.  Mouth/Throat: Oropharynx is clear and moist.  Eyes: Conjunctivae are normal. Pupils are equal, round, and reactive to light. Right eye exhibits no discharge. Left eye exhibits no discharge.  Neck: Normal range of motion.  Neck supple. No JVD present. No tracheal deviation present. No thyromegaly present.  Cardiovascular: Normal rate, regular rhythm and normal heart sounds.   Pulmonary/Chest: No stridor. No respiratory distress. She has no wheezes.  Abdominal: Soft. Bowel sounds are normal. She exhibits no distension and no mass. There is no tenderness. There is no rebound and no guarding.  Musculoskeletal: She exhibits edema (trace) and tenderness.  Lymphadenopathy:    She has no cervical adenopathy.  Neurological: She displays normal reflexes. No cranial nerve deficit. She exhibits normal muscle tone. Coordination normal.  Skin: No rash noted. No erythema.  Psychiatric: Her behavior is normal. Judgment and thought content normal.   Varic veins B   Lab Results  Component Value Date   WBC 10.1 11/08/2011   HGB 13.3 01/11/2013   HCT 39.0 01/11/2013   PLT 224.0 11/08/2011   GLUCOSE 137* 01/11/2013   CHOL 199 11/08/2011   TRIG 89.0 11/08/2011   HDL 62.00 11/08/2011   LDLCALC 119* 11/08/2011   ALT 19 11/08/2011   AST 19 11/08/2011   NA 137 01/11/2013   K 3.9 01/11/2013   CL 101 01/11/2013   CREATININE 1.30* 01/11/2013   BUN 40* 01/11/2013   CO2 30 10/18/2012   TSH 2.47 11/08/2011   INR 1.5 02/05/2008   HGBA1C 6.5 10/18/2012         Assessment & Plan:

## 2013-04-19 ENCOUNTER — Encounter: Payer: Self-pay | Admitting: Internal Medicine

## 2013-04-19 DIAGNOSIS — I872 Venous insufficiency (chronic) (peripheral): Secondary | ICD-10-CM | POA: Insufficient documentation

## 2013-04-19 NOTE — Assessment & Plan Note (Signed)
Continue with current prescription therapy as reflected on the Med list.  

## 2013-04-19 NOTE — Assessment & Plan Note (Signed)
Better Continue with current prescription therapy as reflected on the Med list.  

## 2013-04-19 NOTE — Assessment & Plan Note (Signed)
Vasc surg ref

## 2013-04-19 NOTE — Progress Notes (Signed)
Patient presents today for 3 month diabetes follow-up as part of the employer-sponsored Link to Wellness program. Current diabetes regimen includes Metformin and glimepiride. Most recent MD follow-up was about 6 months ago and pt will follow-up again today after our visit. No med changes or major health changes at this time.   Diabetes Assessment: Type of Diabetes: Type 2; Sees Diabetes provider 4 or more times per year; MD managing Diabetes Plotnikov, PCP; checks feet daily; uses glucometer; takes medications as prescribed; takes an aspirin a day; hypoglycemia frequency Rare; checks blood glucose Never; Other Diabetes History: Current med regimen includes Metformin and glimepirde. Patient continues tolerating these well. Patient does maintain good medication compliance. Patient did not bring meter today and is currently testing only very rarely. Hypoglycemia frequency is rare. Patient reports continued signs and symptoms of neuropathy including numbness/tingling/burning and wants to discuss with [name] the use of Lyrica or gabapentin. This would hopefully assist in symptom improvement in both fibromyalgia and neuropathy. Patient has a follow-up with [name] today and will discuss with him at that time.   Lifestyle Factors: Diet - No major dietary changes at this time. Patient continues to have room for improvement. Meals continue to be somewhat inconcistent and pt admits to splurging on more sweets lately. This is a challenge in her work environment as she and her coworkers are often treated to ice cream, baked goods, donuts, etc.  Exercise - No routine exercise at this time. Pt still holds a membership at [employer], but is not using it at this time. She has not tried the group classes, but would like to start consistent exercise soon. Patient lacks motivation and continues to struggle with depression, especially since death of her mother. However, she is aware of the importance and health  benefits of exercise. We have discussed exercise options for her. She is willing to consider walking downtown or around the [facility] and [place] [facility], as she lives close by. She also is interested in purchasing a leisure bike and feels she would enjoy riding.   Assessment: Pt presents today for 3 mo follow-up. She continues to suffer from a great deal of depression, worsened after loss of her mother. She has made no changes to her DM care and continues to need lifestyle modification and weight loss. A1c has increased slightly from 6.3 to 6.6, likely due to weight gain and deterioration in diet. Patient did seem motivated at this appt and has aggreed to some small lifestyle goals including both diet and exercise. She will follow-up in 3 months.  Plan: 1) Attempt to begin an exercise routine. Consider purchasing a bike and bike rack. Consider walking downtown.  2) Attempt to eat more fresh vegetables vs fresh fruit 3) Trying caffeinated crystal light to replace diet soda 4) Weight loss goal of 10 lbs over next three months 5) Follow-up in 3 months on Wednesday August 6th @ 2:00 pm  A1c today = 6.6, (prev 6.6 in January)

## 2013-04-19 NOTE — Assessment & Plan Note (Signed)
Doing OK 

## 2013-04-19 NOTE — Assessment & Plan Note (Signed)
Better on marbetriq

## 2013-04-26 NOTE — Progress Notes (Signed)
Patient ID: [name], female   DOB: 29-May-1951, 62 y.o.   MRN: [id number] ATTENDING PHYSICIAN NOTE: I have reviewed the chart and agree with the plan as detailed above. Dorcas Mcmurray MD Pager [phone]

## 2013-05-08 ENCOUNTER — Other Ambulatory Visit: Payer: Self-pay | Admitting: Internal Medicine

## 2013-05-24 ENCOUNTER — Encounter: Payer: 59 | Admitting: Vascular Surgery

## 2013-05-31 ENCOUNTER — Encounter: Payer: 59 | Admitting: Vascular Surgery

## 2013-05-31 ENCOUNTER — Ambulatory Visit: Payer: 59 | Admitting: Vascular Surgery

## 2013-07-09 ENCOUNTER — Other Ambulatory Visit: Payer: Self-pay | Admitting: Internal Medicine

## 2013-07-10 ENCOUNTER — Other Ambulatory Visit: Payer: Self-pay | Admitting: *Deleted

## 2013-07-10 ENCOUNTER — Encounter: Payer: Self-pay | Admitting: Vascular Surgery

## 2013-07-10 DIAGNOSIS — I83893 Varicose veins of bilateral lower extremities with other complications: Secondary | ICD-10-CM

## 2013-07-11 ENCOUNTER — Ambulatory Visit (INDEPENDENT_AMBULATORY_CARE_PROVIDER_SITE_OTHER): Payer: 59 | Admitting: Vascular Surgery

## 2013-07-11 ENCOUNTER — Encounter (INDEPENDENT_AMBULATORY_CARE_PROVIDER_SITE_OTHER): Payer: 59 | Admitting: Vascular Surgery

## 2013-07-11 ENCOUNTER — Encounter: Payer: Self-pay | Admitting: Vascular Surgery

## 2013-07-11 VITALS — BP 155/106 | HR 84 | Resp 16 | Ht 62.5 in | Wt 230.0 lb

## 2013-07-11 DIAGNOSIS — R200 Anesthesia of skin: Secondary | ICD-10-CM | POA: Insufficient documentation

## 2013-07-11 DIAGNOSIS — M7989 Other specified soft tissue disorders: Secondary | ICD-10-CM

## 2013-07-11 DIAGNOSIS — I83893 Varicose veins of bilateral lower extremities with other complications: Secondary | ICD-10-CM

## 2013-07-11 DIAGNOSIS — R209 Unspecified disturbances of skin sensation: Secondary | ICD-10-CM

## 2013-07-11 NOTE — Progress Notes (Signed)
Vascular and Vein Specialist of   Patient name: [name] MRN: [id number] DOB: 1951-11-12 Sex: female  REASON FOR CONSULT: bilateral lower extremity swelling.  HPI: [name] is a 62 y.o. 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 She presents with bilateral lower extremity swelling. She has noted some varicose veins of both lower extremities which are more significant on the left side. Her symptoms related to her swelling in her varicose veins include heaviness in her legs, aching pain which is aggravated by standing and swelling bilaterally. She denies any history of claudication or rest pain. She is unaware of any history of DVT or phlebitis. She's had no previous abdominal surgery her surgery and her groins. She has had bilateral total knee replacements.  Past Medical History  Diagnosis Date  . Diabetes mellitus     type II  . GERD (gastroesophageal reflux disease)   . Hypertension   . IBS (irritable bowel syndrome)   . Depression   . Anxiety   . LBP (low back pain)   . Osteoarthritis   . Colitis 2010    microscopic- Dr Henrene Pastor  . Renal insufficiency 2011  . Osteopenia   . Anemia    Family History  Problem Relation Age of Onset  . Crohn's disease Maternal Uncle   . Diabetes Other   . Depression Mother   . Hypertension Mother   . Stroke Mother 31  . Diabetes Mother   . Diabetes Father   . Hyperlipidemia Father   . Hypertension Father   . Cancer Brother     Prostate  . Diabetes Brother   . Heart disease Brother   . Hyperlipidemia Brother   . Hypertension Brother   . Diabetes Brother   . Heart disease Brother     Heart Disease before age 21  . Heart attack Brother   . Stroke Brother     X's 2  . Hyperlipidemia Brother   . Hypertension Brother    SOCIAL HISTORY: History  Substance Use Topics  . Smoking status: Former Smoker    Quit date: 12/13/1988  . Smokeless tobacco: Never Used  . Alcohol Use: No   Allergies  Allergen Reactions  .  Erythromycin Diarrhea and Nausea And Vomiting   Current Outpatient Prescriptions  Medication Sig Dispense Refill  . buPROPion (WELLBUTRIN SR) 150 MG 12 hr tablet TAKE 1 TABLET BY MOUTH 2 TIMES DAILY.  180 tablet  1  . carvedilol (COREG) 12.5 MG tablet TAKE 1 TABLET BY MOUTH 2 TIMES DAILY.  180 tablet  2  . cevimeline (EVOXAC) 30 MG capsule TAKE 1 CAPSULE BY MOUTH 3 TIMES DAILY.  270 capsule  3  . Cholecalciferol (EQL VITAMIN D3) 1000 UNITS tablet Take 1,000 Units by mouth daily.        . cyclobenzaprine (FLEXERIL) 10 MG tablet TAKE 1 TABLET BY MOUTH 3 TIMES DAILY AS NEEDED FOR BACK PAIN  270 tablet  PRN  . DULoxetine (CYMBALTA) 60 MG capsule Take 1 capsule (60 mg total) by mouth daily.  90 capsule  3  . furosemide (LASIX) 40 MG tablet Take 20 mg by mouth daily.      Marland Kitchen glimepiride (AMARYL) 1 MG tablet TAKE 1 TABLET BY MOUTH DAILY BEFORE BREAKFAST.  90 tablet  3  . glucose blood (ONE TOUCH TEST STRIPS) test strip Use as instructed  100 each  3  . ibuprofen (ADVIL,MOTRIN) 600 MG tablet Take 600 mg by mouth as needed.        Marland Kitchen  Lancets (ONETOUCH ULTRASOFT) lancets Use as instructed  100 each  3  . lidocaine (LIDODERM) 5 % Place 1 patch onto the skin as directed. 1-3 patches daily  60 patch  4  . LORazepam (ATIVAN) 2 MG tablet Take 0.5 tablets (1 mg total) by mouth every 6 (six) hours as needed for anxiety.  60 tablet  3  . metFORMIN (GLUCOPHAGE) 1000 MG tablet Take 1 tablet (1,000 mg total) by mouth 2 (two) times daily with a meal.  180 tablet  3  . mirabegron ER (MYRBETRIQ) 50 MG TB24 Take 1 tablet (50 mg total) by mouth daily.  90 tablet  3  . NON FORMULARY Oramoist-1 Bid       . omeprazole (PRILOSEC) 20 MG capsule Take 1 capsule (20 mg total) by mouth daily.  90 capsule  3  . oxyCODONE (OXYCONTIN) 20 MG 12 hr tablet Take 1 tablet (20 mg total) by mouth 2 (two) times daily. Please fill on or after 06/18/13  60 tablet  0  . oxyCODONE (ROXICODONE) 15 MG immediate release tablet Take 1 tablet (15 mg  total) by mouth every 4 (four) hours as needed. Fill on or after 06/18/13  120 tablet  0  . diphenoxylate-atropine (LOMOTIL) 2.5-0.025 MG per tablet Take by mouth. 1-2 tabs q 4-6 hrs prn       . estradiol (ESTRACE) 0.5 MG tablet Take 0.5 mg by mouth every other day.        . medroxyPROGESTERone (PROVERA) 2.5 MG tablet Take 2.5 mg by mouth every other day.        . polycarbophil (FIBERCON) 625 MG tablet Take 4 tablets by mouth daily.        . promethazine (PHENERGAN) 25 MG tablet Take 1 tablet (25 mg total) by mouth every 8 (eight) hours as needed for nausea.  30 tablet  1   No current facility-administered medications for this visit.   REVIEW OF SYSTEMS: Valu.Nieves ] denotes positive finding; [  ] denotes negative finding  CARDIOVASCULAR:  [ ]  chest pain   [ ]  chest pressure   [ ]  palpitations   [ ]  orthopnea   [ ]  dyspnea on exertion   [ ]  claudication   [ ]  rest pain   [ ]  DVT   [ ]  phlebitis PULMONARY:   [ ]  productive cough   [ ]  asthma   [ ]  wheezing NEUROLOGIC:   [ ]  weakness  [ ]  paresthesias  [ ]  aphasia  [ ]  amaurosis  [ ]  dizziness HEMATOLOGIC:   [ ]  bleeding problems   [ ]  clotting disorders MUSCULOSKELETAL:  [ ]  joint pain   [ ]  joint swelling Valu.Nieves ] leg swelling GASTROINTESTINAL: [ ]   blood in stool  [ ]   hematemesis GENITOURINARY:  [ ]   dysuria  Valu.Nieves ]  hematuria PSYCHIATRIC:  [ ]  history of major depression INTEGUMENTARY:  [ ]  rashes  [ ]  ulcers CONSTITUTIONAL:  [ ]  fever   [ ]  chills  PHYSICAL EXAM: Filed Vitals:   07/11/13 1556  BP: 155/106  Pulse: 84  Resp: 16  Height: 5' 2.5" (1.588 m)  Weight: 230 lb (104.327 kg)  SpO2: 97%   Body mass index is 41.37 kg/(m^2). GENERAL: The patient is a well-nourished female, in no acute distress. The vital signs are documented above. CARDIOVASCULAR: There is a regular rate and rhythm. Do not detect carotid bruits. She is palpable and pedal pulses bilaterally. She has mild bilateral lower extremity swelling. PULMONARY:  There is good air  exchange bilaterally without wheezing or rales. ABDOMEN: Soft and non-tender with normal pitched bowel sounds.  MUSCULOSKELETAL: There are no major deformities or cyanosis. NEUROLOGIC: No focal weakness or paresthesias are detected. SKIN: There are no ulcers or rashes noted. He has some varicose veins along the medial aspect of her left knee. PSYCHIATRIC: The patient has a normal affect.  DATA:  I have independently interpreted her venous duplex scan. She does have some incompetence of the right lesser saphenous vein and also the greater saphenous vein around the level of the knee. There is no deep vein incompetence. There is no DVT in the right. On the left side she has no evidence of superficial or deep valvular insufficiency. She has no evidence of DVT on the left.  MEDICAL ISSUES: This patient may have some mild lymphedema related to her bilateral total knee replacements and also some mild chronic venous insufficiency. Have discussed the importance of intermittent leg elevation and the proper positioning for this. I've also written her a prescription for a thigh high compression stockings with a mild gradient. She had tried tighter stockings that they were difficult to get on and 2 hot to wear at work. Reassured her that she did not have any evidence of significant deep vein reflux and had only mild superficial reflux in the lesser saphenous vein and distal greater saphenous vein on the right. We'll be happy to see her back at any time if any new vascular issues arise.  [facility] [place] Beeper: [phone]

## 2013-07-12 ENCOUNTER — Telehealth: Payer: Self-pay | Admitting: *Deleted

## 2013-07-12 MED ORDER — LORAZEPAM 2 MG PO TABS: 1.0000 mg | ORAL_TABLET | Freq: Four times a day (QID) | ORAL | Status: DC | PRN

## 2013-07-12 NOTE — Telephone Encounter (Signed)
Refill done.  

## 2013-07-12 NOTE — Telephone Encounter (Signed)
OK to fill this prescription with additional refills x2 Thank you!  

## 2013-07-12 NOTE — Telephone Encounter (Signed)
Refill request for Lorazepam 2mg  Last OV 5.7.14 Last filled 7.9.14

## 2013-07-18 ENCOUNTER — Ambulatory Visit (INDEPENDENT_AMBULATORY_CARE_PROVIDER_SITE_OTHER): Payer: 59 | Admitting: Family Medicine

## 2013-07-18 VITALS — BP 129/82 | Wt 230.0 lb

## 2013-07-18 DIAGNOSIS — E119 Type 2 diabetes mellitus without complications: Secondary | ICD-10-CM

## 2013-07-18 NOTE — Progress Notes (Signed)
Patient presents today for 3 month diabetes follow-up as part of the employer-sponsored Link to Wellness program. Current diabetes regimen includes Metformin and glimepiride. Patient also continues on daily ACEi and statin. Most recent MD follow-up was approximately 3 months ago. No med changes or major health changes at this time, except for lower back pain that has been severe at times over the past 1-2 days. She believes she injured her back at work this week.  Diabetes Assessment: Type of Diabetes: Type 2; Sees Diabetes provider 4 or more times per year; MD managing Diabetes Plotnikov, PCP; checks feet daily; uses glucometer; takes medications as prescribed; hypoglycemia frequency Rare; does not take an aspirin a day d/c due to bruising; checks blood glucose 1-2 times a week; A1c 6.9 via POC testing, prev 6.6 (5/14) Other Diabetes History:  Current med regimen includes Metformin and glimepirde. Patient continues tolerating these well. Patient does maintain good medication compliance. Patient did not bring meter today and is currently testing 1-2 times per week or when symptomatic. Per patient report glucose is consistenly <150. Hypoglycemia frequency is rare. Patient reports continued signs and symptoms of neuropathy including numbness/tingling/burning and is now taking cymbalta for this. She reports this is not providing adequate relief and she would like to try lyrica if [name] agrees. Patient no longer takes a daily ASA due to frequent bruising, but she is willing to try this again to see if the bruising repeats.  Patient is up to date on eye exam.  Lifestyle Factors: Diet - No major dietary changes at this time. Patient continues to have room for improvement. Meals continue to be somewhat inconcistent. However, she now has her neice living with her while taking classes at [employer], her neice enjoys cooking, but patient admits she is not the healthiest cook. I have encouraged patient to limit  portion sizes and to continue to make healthy choices.  Exercise - No routine exercise at this time. Pt still holds a membership at [employer], but is not using it at this time. She is aware of the importance and health benefits of exercise. We have discussed exercise options for her. Now that she has a roommate (her neice) she has someone to walk/exercise with. I have encouraged her to walk twice per week and she believes this is reasonable.   Assessment: Pt presents today for 3 mo follow-up. She continues to suffer from depression and fatigue. She has made very little changes in her DM care and continues to need lifestyle modification and weight loss. A1c has increased slightly from 6.6 to 6.9, and patient is committed to seeing this come back down. Patient did seem motivated at this appt and has aggreed to make some positive changes in exercise. She will follow-up in 3 months.  Plan: 1) Attempt to walk twice per week for about 20 minutes 2) Continue making healthy dietary choices and limit portion sizes 3) Continue testing when needed 4) Follow-up in 3 months on Wed, November 5th @ 2:00 pm

## 2013-07-25 ENCOUNTER — Ambulatory Visit (INDEPENDENT_AMBULATORY_CARE_PROVIDER_SITE_OTHER): Payer: 59 | Admitting: Internal Medicine

## 2013-07-25 ENCOUNTER — Encounter: Payer: Self-pay | Admitting: Internal Medicine

## 2013-07-25 ENCOUNTER — Other Ambulatory Visit (INDEPENDENT_AMBULATORY_CARE_PROVIDER_SITE_OTHER): Payer: 59

## 2013-07-25 VITALS — BP 122/80 | HR 80 | Temp 97.5°F | Resp 16 | Wt 234.0 lb

## 2013-07-25 DIAGNOSIS — M2550 Pain in unspecified joint: Secondary | ICD-10-CM

## 2013-07-25 DIAGNOSIS — M545 Low back pain, unspecified: Secondary | ICD-10-CM

## 2013-07-25 DIAGNOSIS — F329 Major depressive disorder, single episode, unspecified: Secondary | ICD-10-CM

## 2013-07-25 DIAGNOSIS — F411 Generalized anxiety disorder: Secondary | ICD-10-CM

## 2013-07-25 DIAGNOSIS — R5381 Other malaise: Secondary | ICD-10-CM

## 2013-07-25 DIAGNOSIS — R5383 Other fatigue: Secondary | ICD-10-CM

## 2013-07-25 DIAGNOSIS — E119 Type 2 diabetes mellitus without complications: Secondary | ICD-10-CM

## 2013-07-25 DIAGNOSIS — F3289 Other specified depressive episodes: Secondary | ICD-10-CM

## 2013-07-25 LAB — CBC WITH DIFFERENTIAL/PLATELET
Basophils Absolute: 0 10*3/uL (ref 0.0–0.1)
Basophils Relative: 0.6 % (ref 0.0–3.0)
Eosinophils Absolute: 0.3 10*3/uL (ref 0.0–0.7)
Eosinophils Relative: 4.2 % (ref 0.0–5.0)
HCT: 34.2 % — ABNORMAL LOW (ref 36.0–46.0)
Hemoglobin: 11.4 g/dL — ABNORMAL LOW (ref 12.0–15.0)
Lymphocytes Relative: 26.7 % (ref 12.0–46.0)
Lymphs Abs: 2 10*3/uL (ref 0.7–4.0)
MCHC: 33.4 g/dL (ref 30.0–36.0)
MCV: 90.8 fl (ref 78.0–100.0)
Monocytes Absolute: 0.4 10*3/uL (ref 0.1–1.0)
Monocytes Relative: 5.9 % (ref 3.0–12.0)
Neutro Abs: 4.7 10*3/uL (ref 1.4–7.7)
Neutrophils Relative %: 62.6 % (ref 43.0–77.0)
Platelets: 245 10*3/uL (ref 150.0–400.0)
RBC: 3.77 Mil/uL — ABNORMAL LOW (ref 3.87–5.11)
RDW: 12.5 % (ref 11.5–14.6)
WBC: 7.5 10*3/uL (ref 4.5–10.5)

## 2013-07-25 LAB — HEMOGLOBIN A1C: Hgb A1c MFr Bld: 7 % — ABNORMAL HIGH (ref 4.6–6.5)

## 2013-07-25 LAB — TSH: TSH: 1.64 u[IU]/mL (ref 0.35–5.50)

## 2013-07-25 MED ORDER — METFORMIN HCL 1000 MG PO TABS: 1000.0000 mg | ORAL_TABLET | Freq: Two times a day (BID) | ORAL | Status: DC

## 2013-07-25 MED ORDER — OXYCODONE HCL 20 MG PO TB12: 20.0000 mg | ORAL_TABLET | Freq: Two times a day (BID) | ORAL | Status: DC

## 2013-07-25 MED ORDER — OXYCODONE HCL 15 MG PO TABS: 15.0000 mg | ORAL_TABLET | ORAL | Status: DC | PRN

## 2013-07-25 MED ORDER — OMEPRAZOLE 20 MG PO CPDR: 20.0000 mg | DELAYED_RELEASE_CAPSULE | Freq: Every day | ORAL | Status: DC

## 2013-07-25 NOTE — Progress Notes (Signed)
   Subjective:     HPI    The patient presents for a follow-up of  chronic hypertension, chronic dyslipidemia, type 2 diabetes controlled with medicines. F/u chronic LBP and B knee pain. C/o B LE swelling (has had it before, used compr hose)   BP Readings from Last 3 Encounters:  07/25/13 122/80  07/18/13 129/82  07/11/13 155/106   Wt Readings from Last 3 Encounters:  07/25/13 234 lb (106.142 kg)  07/18/13 230 lb (104.327 kg)  07/11/13 230 lb (104.327 kg)     Review of Systems  Constitutional: Positive for fatigue. Negative for chills, activity change, appetite change and unexpected weight change.  HENT: Negative for congestion, mouth sores and sinus pressure.   Eyes: Negative for visual disturbance.  Respiratory: Negative for cough and chest tightness.   Genitourinary: Negative for frequency, difficulty urinating and vaginal pain.  Musculoskeletal: Positive for back pain, arthralgias and gait problem.  Skin: Negative for pallor and rash.  Neurological: Negative for dizziness, tremors and weakness.  Psychiatric/Behavioral: Positive for sleep disturbance and dysphoric mood. Negative for suicidal ideas and confusion. The patient is nervous/anxious.          Objective:   Physical Exam  Constitutional: She appears well-developed. No distress.  obese  HENT:  Head: Normocephalic.  Right Ear: External ear normal.  Left Ear: External ear normal.  Nose: Nose normal.  Mouth/Throat: Oropharynx is clear and moist.  Eyes: Conjunctivae are normal. Pupils are equal, round, and reactive to light. Right eye exhibits no discharge. Left eye exhibits no discharge.  Neck: Normal range of motion. Neck supple. No JVD present. No tracheal deviation present. No thyromegaly present.  Cardiovascular: Normal rate, regular rhythm and normal heart sounds.   Pulmonary/Chest: No stridor. No respiratory distress. She has no wheezes.  Abdominal: Soft. Bowel sounds are normal. She exhibits no  distension and no mass. There is no tenderness. There is no rebound and no guarding.  Musculoskeletal: She exhibits edema (trace) and tenderness.  Lymphadenopathy:    She has no cervical adenopathy.  Neurological: She displays normal reflexes. No cranial nerve deficit. She exhibits normal muscle tone. Coordination normal.  Skin: No rash noted. No erythema.  Psychiatric: Her behavior is normal. Judgment and thought content normal.      Lab Results  Component Value Date   WBC 10.1 11/08/2011   HGB 13.3 01/11/2013   HCT 39.0 01/11/2013   PLT 224.0 11/08/2011   GLUCOSE 137* 01/11/2013   CHOL 199 11/08/2011   TRIG 89.0 11/08/2011   HDL 62.00 11/08/2011   LDLCALC 119* 11/08/2011   ALT 19 11/08/2011   AST 19 11/08/2011   NA 137 01/11/2013   K 3.9 01/11/2013   CL 101 01/11/2013   CREATININE 1.30* 01/11/2013   BUN 40* 01/11/2013   CO2 30 10/18/2012   TSH 2.47 11/08/2011   INR 1.5 02/05/2008   HGBA1C 6.5 10/18/2012         Assessment & Plan:

## 2013-07-25 NOTE — Assessment & Plan Note (Signed)
Continue with current prescription therapy as reflected on the Med list.  

## 2013-07-25 NOTE — Patient Instructions (Signed)
Gluten free trial (no wheat products) x4-6 weeks. OK to use Gluten-free bread and pasta. Milk free trial (no milk, ice cream and yogurt) x4 weeks. OK to use almond or soy milk. 

## 2013-07-26 LAB — BASIC METABOLIC PANEL
BUN: 22 mg/dL (ref 6–23)
CO2: 31 mEq/L (ref 19–32)
Calcium: 9.3 mg/dL (ref 8.4–10.5)
Chloride: 99 mEq/L (ref 96–112)
Creatinine, Ser: 1 mg/dL (ref 0.4–1.2)
GFR: 57.81 mL/min — ABNORMAL LOW (ref >60.00)
Glucose, Bld: 118 mg/dL — ABNORMAL HIGH (ref 70–99)
Potassium: 4.3 mEq/L (ref 3.5–5.1)
Sodium: 139 mEq/L (ref 135–145)

## 2013-08-17 ENCOUNTER — Telehealth: Payer: Self-pay | Admitting: Internal Medicine

## 2013-08-17 NOTE — Telephone Encounter (Signed)
[facility] to [facility] on this request. If this is ok please give Jinny Blossom a call back at [phone].

## 2013-08-17 NOTE — Telephone Encounter (Signed)
Patient is going to [place] for three weeks.  She is leaving this Sunday.  She wants to get oxycodone filled but the pharmacy will not do it b/c her fill date is 08/19/2013.  The pharmacy told her that they could fill it if the date could be changed.  Please give a call in regard.  Thanks!!

## 2013-08-19 NOTE — Telephone Encounter (Signed)
Ok Thx 

## 2013-08-20 ENCOUNTER — Other Ambulatory Visit: Payer: Self-pay | Admitting: Internal Medicine

## 2013-08-30 NOTE — Progress Notes (Signed)
Patient ID: [name], female   DOB: 29-May-1951, 62 y.o.   MRN: [id number] ATTENDING PHYSICIAN NOTE: I have reviewed the chart and agree with the plan as detailed above. Dorcas Mcmurray MD Pager [phone]

## 2013-09-17 ENCOUNTER — Encounter: Payer: Self-pay | Admitting: Internal Medicine

## 2013-09-17 ENCOUNTER — Ambulatory Visit (INDEPENDENT_AMBULATORY_CARE_PROVIDER_SITE_OTHER): Payer: 59 | Admitting: Internal Medicine

## 2013-09-17 VITALS — BP 150/92 | HR 76 | Temp 99.1°F | Resp 16 | Wt 234.0 lb

## 2013-09-17 DIAGNOSIS — J069 Acute upper respiratory infection, unspecified: Secondary | ICD-10-CM

## 2013-09-17 MED ORDER — OXYCODONE HCL 15 MG PO TABS: 15.0000 mg | ORAL_TABLET | ORAL | Status: DC | PRN

## 2013-09-17 MED ORDER — CEFUROXIME AXETIL 500 MG PO TABS: ORAL_TABLET | ORAL | Status: DC

## 2013-09-17 MED ORDER — OXYCODONE HCL ER 20 MG PO T12A: 20.0000 mg | EXTENDED_RELEASE_TABLET | Freq: Two times a day (BID) | ORAL | Status: DC

## 2013-09-17 NOTE — Patient Instructions (Signed)
Use over-the-counter  "cold" medicines  such as"Afrin" nasal spray for nasal congestion as directed instead. Use" Delsym" or" Robitussin" cough syrup varietis for cough.  You can use plain "Tylenol" or "Advil' for fever, chills and achyness.   

## 2013-09-17 NOTE — Progress Notes (Signed)
   Subjective:     HPI  C/o URI x 1 wk  The patient presents for a follow-up of  chronic hypertension, chronic dyslipidemia, type 2 diabetes controlled with medicines. F/u chronic LBP and B knee pain. C/o B LE swelling (has had it before, used compr hose)   BP Readings from Last 3 Encounters:  09/17/13 150/92  07/25/13 122/80  07/18/13 129/82   Wt Readings from Last 3 Encounters:  09/17/13 234 lb (106.142 kg)  07/25/13 234 lb (106.142 kg)  07/18/13 230 lb (104.327 kg)     Review of Systems  Constitutional: Positive for fatigue. Negative for chills, activity change, appetite change and unexpected weight change.  HENT: Negative for congestion, mouth sores and sinus pressure.   Eyes: Negative for visual disturbance.  Respiratory: Negative for cough and chest tightness.   Genitourinary: Negative for frequency, difficulty urinating and vaginal pain.  Musculoskeletal: Positive for back pain, arthralgias and gait problem.  Skin: Negative for pallor and rash.  Neurological: Negative for dizziness, tremors and weakness.  Psychiatric/Behavioral: Positive for sleep disturbance and dysphoric mood. Negative for suicidal ideas and confusion. The patient is nervous/anxious.          Objective:   Physical Exam  Constitutional: She appears well-developed. No distress.  obese  HENT:  Head: Normocephalic.  Right Ear: External ear normal.  Left Ear: External ear normal.  Nose: Nose normal.  Mouth/Throat: Oropharynx is clear and moist.  Eyes: Conjunctivae are normal. Pupils are equal, round, and reactive to light. Right eye exhibits no discharge. Left eye exhibits no discharge.  Neck: Normal range of motion. Neck supple. No JVD present. No tracheal deviation present. No thyromegaly present.  Cardiovascular: Normal rate, regular rhythm and normal heart sounds.   Pulmonary/Chest: No stridor. No respiratory distress. She has no wheezes.  Abdominal: Soft. Bowel sounds are normal. She  exhibits no distension and no mass. There is no tenderness. There is no rebound and no guarding.  Musculoskeletal: She exhibits edema (trace) and tenderness.  Lymphadenopathy:    She has no cervical adenopathy.  Neurological: She displays normal reflexes. No cranial nerve deficit. She exhibits normal muscle tone. Coordination normal.  Skin: No rash noted. No erythema.  Psychiatric: Her behavior is normal. Judgment and thought content normal.      Lab Results  Component Value Date   WBC 7.5 07/25/2013   HGB 11.4* 07/25/2013   HCT 34.2* 07/25/2013   PLT 245.0 07/25/2013   GLUCOSE 118* 07/25/2013   CHOL 199 11/08/2011   TRIG 89.0 11/08/2011   HDL 62.00 11/08/2011   LDLCALC 119* 11/08/2011   ALT 19 11/08/2011   AST 19 11/08/2011   NA 139 07/25/2013   K 4.3 07/25/2013   CL 99 07/25/2013   CREATININE 1.0 07/25/2013   BUN 22 07/25/2013   CO2 31 07/25/2013   TSH 1.64 07/25/2013   INR 1.5 02/05/2008   HGBA1C 7.0* 07/25/2013         Assessment & Plan:

## 2013-10-31 ENCOUNTER — Ambulatory Visit: Payer: 59 | Admitting: Internal Medicine

## 2013-11-01 ENCOUNTER — Telehealth: Payer: Self-pay | Admitting: *Deleted

## 2013-11-01 NOTE — Telephone Encounter (Signed)
Rec Rf req for Amoxicillin 2 g prior to dental procedure. Ok to Rf?

## 2013-11-02 MED ORDER — AMOXICILLIN 500 MG PO CAPS: ORAL_CAPSULE | ORAL | Status: DC

## 2013-11-02 NOTE — Telephone Encounter (Signed)
OK to fill this prescription with additional refills x3 Thank you!  

## 2013-11-02 NOTE — Telephone Encounter (Signed)
Done

## 2013-11-14 ENCOUNTER — Ambulatory Visit (INDEPENDENT_AMBULATORY_CARE_PROVIDER_SITE_OTHER): Payer: Self-pay | Admitting: Family Medicine

## 2013-11-14 VITALS — BP 142/88 | Wt 225.0 lb

## 2013-11-14 DIAGNOSIS — E119 Type 2 diabetes mellitus without complications: Secondary | ICD-10-CM

## 2013-11-15 NOTE — Progress Notes (Signed)
Patient presents today for 3 month diabetes follow-up as part of the employer-sponsored Link to Wellness program. Current diabetes regimen includes Metformin and glimepiride. Patient does not take an ASA at this time due to bruising. Most recent MD follow-up was October 2014 for respiratory infection. Patient does not have a pending appt at this time. No med changes or major health changes at this time.  Diabetes Assessment: Type of Diabetes: Type 2; Sees Diabetes provider 4 or more times per year; MD managing Diabetes Plotnikov, PCP; checks feet daily; uses glucometer; takes medications as prescribed; hypoglycemia frequency Rare; checks blood glucose 2-3 times a week; does not take an aspirin a day due to bruising; A1c 6.8 (prev 6.9) Other Diabetes History: Current med regimen includes Metformin and glimepiride. Patient continues tolerating these well. Patient does maintain good medication compliance. Patient did not bring meter today but is currently testing 2-3 times per week or when symptomatic. Per patient report glucose is consistenly <140 and often in low 100s fasting or even <100 if patient has eaten a light supper. Hypoglycemia frequency is rare. Patient reports continued signs and symptoms of neuropathy including numbness/tingling/burning and is continues taking cymbalta for this. She is unsatisfied with the cymbalta and continues to experience significant discomfort in both feet. She has spoken with MD about switching to gabapentin or lyrica, and according to patient, MD does not wish to make changes at this time. She also has a pending appt with orthopedist for assessment of feet, specifically a nodule on second joint of second toe. This nodule is red and sometimes painful and has increased in size over time. Patient is due for eye exam and will schedule appt soon.  Lifestyle Factors: Diet - Patient's neice continues living with her while taking college courses. She and her neice continue to try  to prepare healthy meals for supper. Patient has attempted to better control portion sizes and has tried to avoid snacking between meals. She always has a bowl of either hot or cold cereal before work. She is now packing lunch during day, including a sandwich (PB, sandwich meat, chicken, etc) pc of fruit or carrots/tom/celery, and sometimes baked chips. She does report skipping supper sometimes depending on time that she gets in from work.  Exercise - No routine exercise at this time. Patient suffers from a lot of fatigue due to long and stressful work hours, and lack of sleep (~4 hours per night on average). She understands the importance of exercise, but often does not have the time or energy. 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 We will continue to work toward finding exercises she is able to do.  Assessment: Pt presents today for 3 mo follow-up. She continues to suffer from depression and fatigue. She has made a few small changes in her DM care and continues to need lifestyle modification and weight loss. A1c has decreased slightly from 6.9 to 6.8, and patient is committed to seeing this continue to improve. She will follow-up in 3 months.  Plan: 1) Continue to monitor portion sizes and healthy choices 2) Attempt to exercise as tolerated 3) Continue testing regularly 4) Schedule eye exam 5) Follow-up in 3 months on Wednesday March 4th @ 3:30 pm

## 2013-12-01 ENCOUNTER — Encounter (HOSPITAL_BASED_OUTPATIENT_CLINIC_OR_DEPARTMENT_OTHER): Payer: Self-pay | Admitting: Emergency Medicine

## 2013-12-01 ENCOUNTER — Emergency Department (HOSPITAL_BASED_OUTPATIENT_CLINIC_OR_DEPARTMENT_OTHER)
Admission: EM | Admit: 2013-12-01 | Discharge: 2013-12-01 | Disposition: A | Discharge status: Home / Self Care | Payer: 59 | Attending: Emergency Medicine | Admitting: Emergency Medicine

## 2013-12-01 ENCOUNTER — Emergency Department (HOSPITAL_BASED_OUTPATIENT_CLINIC_OR_DEPARTMENT_OTHER): Payer: 59

## 2013-12-01 DIAGNOSIS — I10 Essential (primary) hypertension: Secondary | ICD-10-CM | POA: Insufficient documentation

## 2013-12-01 DIAGNOSIS — Z79899 Other long term (current) drug therapy: Secondary | ICD-10-CM | POA: Insufficient documentation

## 2013-12-01 DIAGNOSIS — J159 Unspecified bacterial pneumonia: Secondary | ICD-10-CM | POA: Insufficient documentation

## 2013-12-01 DIAGNOSIS — Z862 Personal history of diseases of the blood and blood-forming organs and certain disorders involving the immune mechanism: Secondary | ICD-10-CM | POA: Insufficient documentation

## 2013-12-01 DIAGNOSIS — F3289 Other specified depressive episodes: Secondary | ICD-10-CM | POA: Insufficient documentation

## 2013-12-01 DIAGNOSIS — M1990 Unspecified osteoarthritis, unspecified site: Secondary | ICD-10-CM | POA: Insufficient documentation

## 2013-12-01 DIAGNOSIS — F329 Major depressive disorder, single episode, unspecified: Secondary | ICD-10-CM | POA: Insufficient documentation

## 2013-12-01 DIAGNOSIS — F411 Generalized anxiety disorder: Secondary | ICD-10-CM | POA: Insufficient documentation

## 2013-12-01 DIAGNOSIS — E119 Type 2 diabetes mellitus without complications: Secondary | ICD-10-CM | POA: Insufficient documentation

## 2013-12-01 DIAGNOSIS — Z87891 Personal history of nicotine dependence: Secondary | ICD-10-CM | POA: Insufficient documentation

## 2013-12-01 DIAGNOSIS — K219 Gastro-esophageal reflux disease without esophagitis: Secondary | ICD-10-CM | POA: Insufficient documentation

## 2013-12-01 DIAGNOSIS — J189 Pneumonia, unspecified organism: Secondary | ICD-10-CM

## 2013-12-01 MED ORDER — HYDROCODONE-HOMATROPINE 5-1.5 MG/5ML PO SYRP: 5.0000 mL | ORAL_SOLUTION | Freq: Four times a day (QID) | ORAL | Status: DC | PRN

## 2013-12-01 MED ORDER — ALBUTEROL SULFATE HFA 108 (90 BASE) MCG/ACT IN AERS
2.0000 | INHALATION_SPRAY | Freq: Once | RESPIRATORY_TRACT | Status: AC
  Administered 2013-12-01: 2 via RESPIRATORY_TRACT
  Filled 2013-12-01: qty 6.7

## 2013-12-01 MED ORDER — LEVOFLOXACIN 500 MG PO TABS: 500.0000 mg | ORAL_TABLET | Freq: Every day | ORAL | Status: DC

## 2013-12-01 MED ORDER — ALBUTEROL SULFATE (5 MG/ML) 0.5% IN NEBU
5.0000 mg | INHALATION_SOLUTION | Freq: Once | RESPIRATORY_TRACT | Status: AC
  Administered 2013-12-01: 5 mg via RESPIRATORY_TRACT
  Filled 2013-12-01: qty 1

## 2013-12-01 NOTE — ED Provider Notes (Signed)
Medical screening examination/treatment/procedure(s) were performed by non-physician practitioner and as supervising physician I was immediately available for consultation/collaboration.  EKG Interpretation   None         Osvaldo Shipper, MD 12/01/13 2314

## 2013-12-01 NOTE — ED Provider Notes (Signed)
CSN: [id number]     Arrival date & time 12/01/13  1528 History   First MD Initiated Contact with Patient 12/01/13 1608     Chief Complaint  Patient presents with  . Cough  . Fever  . Shortness of Breath   (Consider location/radiation/quality/duration/timing/severity/associated sxs/prior Treatment) HPI Comments: Patient is a 62 year old female who presents with a 1 week history of cough, fever, and shortness of breath. Symptoms started gradually and progressively worsened since the onset. 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 Patient reports subjective fever and productive cough with green sputum. Patient is not a smoker. She has tried increasing fluid intake for symptoms which have provided no relief. No other associated symptoms. No known sick contacts.    Past Medical History  Diagnosis Date  . Diabetes mellitus     type II  . GERD (gastroesophageal reflux disease)   . Hypertension   . IBS (irritable bowel syndrome)   . Depression   . Anxiety   . LBP (low back pain)   . Osteoarthritis   . Colitis 2010    microscopic- Dr Henrene Pastor  . Renal insufficiency 2011  . Osteopenia   . Anemia    Past Surgical History  Procedure Laterality Date  . Foramen magnum ependymoma surgery  2003    Dr Rita Ohara  . Total knee arthroplasty      L 2008, R 2009- Dr Maureen Ralphs  . Joint replacement Bilateral    Family History  Problem Relation Age of Onset  . Crohn's disease Maternal Uncle   . Diabetes Other   . Depression Mother   . Hypertension Mother   . Stroke Mother 72  . Diabetes Mother   . Diabetes Father   . Hyperlipidemia Father   . Hypertension Father   . Cancer Brother     Prostate  . Diabetes Brother   . Heart disease Brother   . Hyperlipidemia Brother   . Hypertension Brother   . Diabetes Brother   . Heart disease Brother     Heart Disease before age 28  . Heart attack Brother   . Stroke Brother      X's 2  . Hyperlipidemia Brother   . Hypertension Brother    History  Substance Use Topics  . Smoking status: Former Smoker    Quit date: 12/13/1988  . Smokeless tobacco: Never Used  . Alcohol Use: No   OB History   Grav Para Term Preterm Abortions TAB SAB Ect Mult Living                 Review of Systems  Constitutional: Positive for fever. Negative for chills and fatigue.  HENT: Negative for trouble swallowing.   Eyes: Negative for visual disturbance.  Respiratory: Positive for cough and wheezing. Negative for shortness of breath.   Cardiovascular: Negative for chest pain and palpitations.  Gastrointestinal: Negative for nausea, vomiting, abdominal pain and diarrhea.  Genitourinary: Negative for dysuria and difficulty urinating.  Musculoskeletal: Negative for arthralgias and neck pain.  Skin: Negative for color change.  Neurological: Negative for dizziness and weakness.  Psychiatric/Behavioral: Negative for dysphoric mood.    Allergies  Erythromycin  Home Medications   Current Outpatient Rx  Name  Route  Sig  Dispense  Refill  . amoxicillin (AMOXIL) 500 MG capsule      Take 4 by mouth one hour prior to dental procedure/cleaning.   4  capsule   3   . buPROPion (WELLBUTRIN SR) 150 MG 12 hr tablet      TAKE 1 TABLET BY MOUTH 2 TIMES DAILY.   180 tablet   1   . carvedilol (COREG) 12.5 MG tablet      TAKE 1 TABLET BY MOUTH 2 TIMES DAILY.   180 tablet   2   . cevimeline (EVOXAC) 30 MG capsule      TAKE 1 CAPSULE BY MOUTH 3 TIMES DAILY.   270 capsule   3   . Cholecalciferol (EQL VITAMIN D3) 1000 UNITS tablet   Oral   Take 1,000 Units by mouth daily.           . cyclobenzaprine (FLEXERIL) 10 MG tablet      TAKE 1 TABLET BY MOUTH 3 TIMES DAILY AS NEEDED FOR BACK PAIN   270 tablet   PRN   . DULoxetine (CYMBALTA) 60 MG capsule   Oral   Take 1 capsule (60 mg total) by mouth daily.   90 capsule   3   . EXPIRED: furosemide (LASIX) 40 MG tablet    Oral   Take 20 mg by mouth daily.         Marland Kitchen glimepiride (AMARYL) 1 MG tablet      TAKE 1 TABLET BY MOUTH DAILY BEFORE BREAKFAST.   90 tablet   3   . glucose blood (ONE TOUCH TEST STRIPS) test strip      Use as instructed   100 each   3   . ibuprofen (ADVIL,MOTRIN) 600 MG tablet   Oral   Take 600 mg by mouth as needed.           . Lancets (ONETOUCH ULTRASOFT) lancets      Use as instructed   100 each   3   . lidocaine (LIDODERM) 5 %   Transdermal   Place 1 patch onto the skin as directed. 1-3 patches daily   60 patch   4   . LORazepam (ATIVAN) 2 MG tablet   Oral   Take 0.5 tablets (1 mg total) by mouth every 6 (six) hours as needed for anxiety.   60 tablet   2   . metFORMIN (GLUCOPHAGE) 1000 MG tablet   Oral   Take 1 tablet (1,000 mg total) by mouth 2 (two) times daily with a meal.   180 tablet   3   . mirabegron ER (MYRBETRIQ) 50 MG TB24   Oral   Take 1 tablet (50 mg total) by mouth daily.   90 tablet   3   . omeprazole (PRILOSEC) 20 MG capsule   Oral   Take 1 capsule (20 mg total) by mouth daily.   90 capsule   3   . OxyCODONE (OXYCONTIN) 20 mg T12A 12 hr tablet   Oral   Take 1 tablet (20 mg total) by mouth every 12 (twelve) hours. Please fill on or after 11/18/13   60 tablet   0   . oxyCODONE (ROXICODONE) 15 MG immediate release tablet   Oral   Take 1 tablet (15 mg total) by mouth every 4 (four) hours as needed. Fill on or after 11/18/13   120 tablet   0   . polycarbophil (FIBERCON) 625 MG tablet   Oral   Take 4 tablets by mouth daily.            BP 194/90  Pulse 77  Temp(Src) 98.3 F (36.8 C) (Oral)  Resp 18  Ht 5\' 3"  (1.6 m)  Wt 225 lb (102.059 kg)  BMI 39.87 kg/m2  SpO2 98% Physical Exam  Nursing note and vitals reviewed. Constitutional: She is oriented to person, place, and time. She appears well-developed and well-nourished. No distress.  Patient coughing throughout exam and interview.   HENT:  Head: Normocephalic and  atraumatic.  Mouth/Throat: Oropharynx is clear and moist. No oropharyngeal exudate.  Eyes: Conjunctivae and EOM are normal.  Neck: Normal range of motion.  Cardiovascular: Normal rate and regular rhythm.  Exam reveals no gallop and no friction rub.   No murmur heard. Pulmonary/Chest: Effort normal. She has wheezes. She has no rales. She exhibits no tenderness.  Rhonchi and wheezing noted throughout bilateral lung fields. Right>Left.   Abdominal: Soft. She exhibits no distension. There is no tenderness. There is no rebound and no guarding.  Musculoskeletal: Normal range of motion.  Neurological: She is alert and oriented to person, place, and time. Coordination normal.  Speech is goal-oriented. Moves limbs without ataxia.   Skin: Skin is warm and dry.  Psychiatric: She has a normal mood and affect. Her behavior is normal.    ED Course  Procedures (including critical care time) Labs Review Labs Reviewed - No data to display Imaging Review Dg Chest 2 View  12/01/2013   CLINICAL DATA:  Cough fever and dyspnea  EXAM: CHEST  2 VIEW  COMPARISON:  Chest x-ray of September 26, 2013.  FINDINGS: The lungs are adequately inflated. There are very mildly increased perihilar interstitial markings. There is no alveolar infiltrate. The cardiopericardial silhouette is normal in size. The pulmonary vascularity is not engorged. There is no pleural effusion or pneumothorax. The observed portions of the bony thorax exhibit degenerative change of the lower thoracic spine but no acute abnormalities.  IMPRESSION: Minimally increased perihilar interstitial markings especially on the right may reflect subsegmental atelectasis or early interstitial pneumonia. There is no alveolar pneumonia nor evidence of CHF. Followup films following therapy are recommended to assure clearing.   Electronically Signed   By: David  Martinique   On: 12/01/2013 16:14    EKG Interpretation   None       MDM   1. CAP (community acquired  pneumonia)     5:10 PM Patient's chest xray shows early pneumonia. Patient will have albuterol nebulizer here. Patient will be discharged with Levaquin, albuterol inhaler and hycodan. Vitals stable and patient afebrile. Patient will have recommended follow up with her PCP for recheck.     Alvina Chou, PA-C 12/01/13 1723

## 2013-12-01 NOTE — ED Notes (Addendum)
Pt has cough, fever and some sob with exertion.  Pt states she feels some wheezing with exertion.  Some productive cough, yellow tinged sputum.  Right lower rib pain with coughing, some ha.

## 2013-12-03 ENCOUNTER — Other Ambulatory Visit: Payer: Self-pay | Admitting: Internal Medicine

## 2013-12-19 ENCOUNTER — Encounter: Payer: Self-pay | Admitting: Internal Medicine

## 2013-12-19 ENCOUNTER — Ambulatory Visit (INDEPENDENT_AMBULATORY_CARE_PROVIDER_SITE_OTHER): Payer: 59 | Admitting: Internal Medicine

## 2013-12-19 VITALS — BP 142/90 | HR 80 | Resp 16 | Wt 241.0 lb

## 2013-12-19 DIAGNOSIS — M1990 Unspecified osteoarthritis, unspecified site: Secondary | ICD-10-CM

## 2013-12-19 DIAGNOSIS — Z2911 Encounter for prophylactic immunotherapy for respiratory syncytial virus (RSV): Secondary | ICD-10-CM

## 2013-12-19 DIAGNOSIS — E119 Type 2 diabetes mellitus without complications: Secondary | ICD-10-CM

## 2013-12-19 DIAGNOSIS — Z23 Encounter for immunization: Secondary | ICD-10-CM

## 2013-12-19 DIAGNOSIS — M545 Low back pain, unspecified: Secondary | ICD-10-CM

## 2013-12-19 DIAGNOSIS — I10 Essential (primary) hypertension: Secondary | ICD-10-CM

## 2013-12-19 MED ORDER — OXYCODONE HCL 15 MG PO TABS: 15.0000 mg | ORAL_TABLET | ORAL | Status: DC | PRN

## 2013-12-19 MED ORDER — CYCLOBENZAPRINE HCL 10 MG PO TABS: ORAL_TABLET | ORAL | Status: DC

## 2013-12-19 MED ORDER — OXYCODONE HCL ER 20 MG PO T12A: 20.0000 mg | EXTENDED_RELEASE_TABLET | Freq: Two times a day (BID) | ORAL | Status: DC

## 2013-12-19 MED ORDER — LORAZEPAM 2 MG PO TABS: 1.0000 mg | ORAL_TABLET | Freq: Four times a day (QID) | ORAL | Status: DC | PRN

## 2013-12-19 MED ORDER — METFORMIN HCL 1000 MG PO TABS: 1000.0000 mg | ORAL_TABLET | Freq: Two times a day (BID) | ORAL | Status: DC

## 2013-12-19 NOTE — Progress Notes (Signed)
Pre visit review using our clinic review tool, if applicable. No additional management support is needed unless otherwise documented below in the visit note. 

## 2013-12-19 NOTE — Assessment & Plan Note (Signed)
Continue with current prescription therapy as reflected on the Med list.  

## 2013-12-19 NOTE — Progress Notes (Signed)
   Subjective:     HPI  F/u URI/CAP (CXR 12/01/13) - took Levaquin  The patient presents for a follow-up of  chronic hypertension, chronic dyslipidemia, type 2 diabetes controlled with medicines. F/u chronic LBP and B knee pain. C/o B LE swelling (has had it before, used compr hose)   BP Readings from Last 3 Encounters:  12/19/13 142/90  12/01/13 194/90  11/15/13 142/88   Wt Readings from Last 3 Encounters:  12/19/13 241 lb (109.317 kg)  12/01/13 225 lb (102.059 kg)  11/15/13 225 lb (102.059 kg)     Review of Systems  Constitutional: Positive for fatigue. Negative for chills, activity change, appetite change and unexpected weight change.  HENT: Negative for congestion, mouth sores and sinus pressure.   Eyes: Negative for visual disturbance.  Respiratory: Negative for cough and chest tightness.   Genitourinary: Negative for frequency, difficulty urinating and vaginal pain.  Musculoskeletal: Positive for arthralgias, back pain and gait problem.  Skin: Negative for pallor and rash.  Neurological: Negative for dizziness, tremors and weakness.  Psychiatric/Behavioral: Positive for sleep disturbance and dysphoric mood. Negative for suicidal ideas and confusion. The patient is nervous/anxious.          Objective:   Physical Exam  Constitutional: She appears well-developed. No distress.  obese  HENT:  Head: Normocephalic.  Right Ear: External ear normal.  Left Ear: External ear normal.  Nose: Nose normal.  Mouth/Throat: Oropharynx is clear and moist.  Eyes: Conjunctivae are normal. Pupils are equal, round, and reactive to light. Right eye exhibits no discharge. Left eye exhibits no discharge.  Neck: Normal range of motion. Neck supple. No JVD present. No tracheal deviation present. No thyromegaly present.  Cardiovascular: Normal rate, regular rhythm and normal heart sounds.   Pulmonary/Chest: No stridor. No respiratory distress. She has no wheezes.  Abdominal: Soft. Bowel  sounds are normal. She exhibits no distension and no mass. There is no tenderness. There is no rebound and no guarding.  Musculoskeletal: She exhibits edema (trace) and tenderness.  Lymphadenopathy:    She has no cervical adenopathy.  Neurological: She displays normal reflexes. No cranial nerve deficit. She exhibits normal muscle tone. Coordination normal.  Skin: No rash noted. No erythema.  Psychiatric: Her behavior is normal. Judgment and thought content normal.    CXR  Lab Results  Component Value Date   WBC 7.5 07/25/2013   HGB 11.4* 07/25/2013   HCT 34.2* 07/25/2013   PLT 245.0 07/25/2013   GLUCOSE 118* 07/25/2013   CHOL 199 11/08/2011   TRIG 89.0 11/08/2011   HDL 62.00 11/08/2011   LDLCALC 119* 11/08/2011   ALT 19 11/08/2011   AST 19 11/08/2011   NA 139 07/25/2013   K 4.3 07/25/2013   CL 99 07/25/2013   CREATININE 1.0 07/25/2013   BUN 22 07/25/2013   CO2 31 07/25/2013   TSH 1.64 07/25/2013   INR 1.5 02/05/2008   HGBA1C 7.0* 07/25/2013         Assessment & Plan:

## 2013-12-20 ENCOUNTER — Telehealth: Payer: Self-pay

## 2013-12-20 NOTE — Telephone Encounter (Signed)
Relevant patient education mailed to patient.  

## 2014-01-21 ENCOUNTER — Other Ambulatory Visit: Payer: Self-pay | Admitting: Internal Medicine

## 2014-01-22 NOTE — Telephone Encounter (Signed)
Refill request for Duloxetine  Last filled by MD on - 01/22/13 #90 x3 Last Appt: 12/19/13 Next Appt: 03/20/14 Please advise refill?

## 2014-01-25 ENCOUNTER — Other Ambulatory Visit: Payer: Self-pay | Admitting: *Deleted

## 2014-01-25 MED ORDER — DULOXETINE HCL 60 MG PO CPEP: 60.0000 mg | ORAL_CAPSULE | Freq: Every day | ORAL | Status: DC

## 2014-01-28 ENCOUNTER — Other Ambulatory Visit: Payer: Self-pay | Admitting: Internal Medicine

## 2014-02-15 ENCOUNTER — Other Ambulatory Visit: Payer: Self-pay | Admitting: *Deleted

## 2014-02-15 MED ORDER — BUPROPION HCL ER (SR) 150 MG PO TB12: ORAL_TABLET | ORAL | Status: DC

## 2014-02-19 NOTE — Progress Notes (Signed)
Patient ID: [name], female   DOB: 09/04/1951, 63 y.o.   MRN: [id number] ATTENDING PHYSICIAN NOTE: I have reviewed the chart and agree with the plan as detailed above. Dorcas Mcmurray MD Pager [phone]

## 2014-03-18 ENCOUNTER — Other Ambulatory Visit: Payer: Self-pay | Admitting: Internal Medicine

## 2014-03-20 ENCOUNTER — Encounter (INDEPENDENT_AMBULATORY_CARE_PROVIDER_SITE_OTHER): Payer: 59

## 2014-03-20 ENCOUNTER — Ambulatory Visit (INDEPENDENT_AMBULATORY_CARE_PROVIDER_SITE_OTHER): Payer: Self-pay | Admitting: Family Medicine

## 2014-03-20 ENCOUNTER — Ambulatory Visit (INDEPENDENT_AMBULATORY_CARE_PROVIDER_SITE_OTHER): Payer: 59 | Admitting: Internal Medicine

## 2014-03-20 ENCOUNTER — Encounter: Payer: Self-pay | Admitting: Internal Medicine

## 2014-03-20 VITALS — BP 122/76 | HR 94 | Temp 98.7°F | Ht 63.0 in | Wt 236.0 lb

## 2014-03-20 DIAGNOSIS — F3289 Other specified depressive episodes: Secondary | ICD-10-CM

## 2014-03-20 DIAGNOSIS — M545 Low back pain, unspecified: Secondary | ICD-10-CM

## 2014-03-20 DIAGNOSIS — F329 Major depressive disorder, single episode, unspecified: Secondary | ICD-10-CM

## 2014-03-20 DIAGNOSIS — E119 Type 2 diabetes mellitus without complications: Secondary | ICD-10-CM

## 2014-03-20 DIAGNOSIS — Z23 Encounter for immunization: Secondary | ICD-10-CM

## 2014-03-20 DIAGNOSIS — I10 Essential (primary) hypertension: Secondary | ICD-10-CM

## 2014-03-20 DIAGNOSIS — F411 Generalized anxiety disorder: Secondary | ICD-10-CM

## 2014-03-20 MED ORDER — OXYCODONE HCL 15 MG PO TABS: 15.0000 mg | ORAL_TABLET | Freq: Four times a day (QID) | ORAL | Status: DC | PRN

## 2014-03-20 MED ORDER — OXYCODONE HCL ER 20 MG PO T12A: 20.0000 mg | EXTENDED_RELEASE_TABLET | Freq: Two times a day (BID) | ORAL | Status: DC

## 2014-03-20 NOTE — Assessment & Plan Note (Signed)
Continue with current prescription therapy as reflected on the Med list.  

## 2014-03-20 NOTE — Progress Notes (Signed)
   Subjective:     HPI    The patient presents for a follow-up of  chronic hypertension, chronic dyslipidemia, type 2 diabetes controlled with medicines. F/u chronic LBP and B knee pain. F/u B LE swelling (has had it before, used compr hose)   BP Readings from Last 3 Encounters:  03/20/14 122/76  12/19/13 142/90  12/01/13 194/90   Wt Readings from Last 3 Encounters:  03/20/14 236 lb (107.049 kg)  12/19/13 241 lb (109.317 kg)  12/01/13 225 lb (102.059 kg)     Review of Systems  Constitutional: Positive for fatigue. Negative for chills, activity change, appetite change and unexpected weight change.  HENT: Negative for congestion, mouth sores and sinus pressure.   Eyes: Negative for visual disturbance.  Respiratory: Negative for cough and chest tightness.   Genitourinary: Negative for frequency, difficulty urinating and vaginal pain.  Musculoskeletal: Positive for arthralgias, back pain and gait problem.  Skin: Negative for pallor and rash.  Neurological: Negative for dizziness, tremors and weakness.  Psychiatric/Behavioral: Positive for sleep disturbance and dysphoric mood. Negative for suicidal ideas and confusion. The patient is nervous/anxious.          Objective:   Physical Exam  Constitutional: She appears well-developed. No distress.  obese  HENT:  Head: Normocephalic.  Right Ear: External ear normal.  Left Ear: External ear normal.  Nose: Nose normal.  Mouth/Throat: Oropharynx is clear and moist.  Eyes: Conjunctivae are normal. Pupils are equal, round, and reactive to light. Right eye exhibits no discharge. Left eye exhibits no discharge.  Neck: Normal range of motion. Neck supple. No JVD present. No tracheal deviation present. No thyromegaly present.  Cardiovascular: Normal rate, regular rhythm and normal heart sounds.   Pulmonary/Chest: No stridor. No respiratory distress. She has no wheezes.  Abdominal: Soft. Bowel sounds are normal. She exhibits no  distension and no mass. There is no tenderness. There is no rebound and no guarding.  Musculoskeletal: She exhibits edema (trace) and tenderness.  Lymphadenopathy:    She has no cervical adenopathy.  Neurological: She displays normal reflexes. No cranial nerve deficit. She exhibits normal muscle tone. Coordination normal.  Skin: No rash noted. No erythema.  Psychiatric: Her behavior is normal. Judgment and thought content normal.    CXR  Lab Results  Component Value Date   WBC 7.5 07/25/2013   HGB 11.4* 07/25/2013   HCT 34.2* 07/25/2013   PLT 245.0 07/25/2013   GLUCOSE 118* 07/25/2013   CHOL 199 11/08/2011   TRIG 89.0 11/08/2011   HDL 62.00 11/08/2011   LDLCALC 119* 11/08/2011   ALT 19 11/08/2011   AST 19 11/08/2011   NA 139 07/25/2013   K 4.3 07/25/2013   CL 99 07/25/2013   CREATININE 1.0 07/25/2013   BUN 22 07/25/2013   CO2 31 07/25/2013   TSH 1.64 07/25/2013   INR 1.5 02/05/2008   HGBA1C 7.0* 07/25/2013         Assessment & Plan:

## 2014-03-20 NOTE — Progress Notes (Signed)
Pre visit review using our clinic review tool, if applicable. No additional management support is needed unless otherwise documented below in the visit note. 

## 2014-03-21 NOTE — Progress Notes (Signed)
Patient presents today for 3 month diabetes follow-up as part of the employer-sponsored Link to Wellness program. Current diabetes regimen includes Metformin and glimepiride. Most recent MD follow-up was October 2014. Patient has a pending appt for today with [name]. Of note, patient has discontinued Myrbetriq due to lack of efficacy. She has now resumed Oxybutynin and will speak with MD regarding continued therapy. No major health changes at this time.   Diabetes Assessment: Type of Diabetes: Type 2; Sees Diabetes provider 4 or more times per year; MD managing Diabetes Plotnikov, PCP; checks feet daily; uses glucometer; takes medications as prescribed; hypoglycemia frequency Rare; checks blood glucose 1-2 times a week; does not take an aspirin a day d/c due to bruising; Highest CBG 180; Lowest CBG 100 Other Diabetes History: Current med regimen includes Metformin and glimepiride. Patient continues tolerating these well. Patient does maintain good medication compliance. Patient did not bring meter today and is currently testing 2-3 times per week or when symptomatic. Per patient report glucose is consistenly <140 and often in low 100s fasting or even <100 if patient has eaten a light supper. Hypoglycemia frequency is rare. Patient reports continued signs and symptoms of neuropathy including numbness/tingling/burning and is continues taking cymbalta for this but does not feel it provides adequate relief. She has spoken with MD about switching to gabapentin or lyrica, and according to patient, MD does not wish to make this change at this time. Patient is up to date on yearly eye exam.  Lifestyle Factors: Diet - Patient has made some small but positive changes to diet recently. She is now limiting carbs such as white breads, pasta, and potatoes. She is eating fewer sandwiches and has attempted to increase vegetable intake.  Exercise - Patient is walking on occassion now which is a significant  improvement. She [place] the [place] around [facility]. She reports that walking continues to be painful for her feet due to neuropathy, but patient is walking as tolerated.   Assessment: Pt presents today for 3 mo follow-up. She continues to suffer from depression and fatigue, but seems much better at this visit. She has made a few small changes in her DM care, specifically increased exercise. Did not check A1c today as patient was heading over to MD appt after our visit. She will follow-up in 3 months.  Plan: 1) Continue making healthy dietary choices 2) Continue walking, attempt to walk twice per week 3) Continue testing regularly 4) Follow-up in 3 months on Wednesday July 8th @ 3:00 pm

## 2014-03-25 ENCOUNTER — Other Ambulatory Visit: Payer: Self-pay | Admitting: Internal Medicine

## 2014-04-03 ENCOUNTER — Other Ambulatory Visit: Payer: Self-pay | Admitting: *Deleted

## 2014-04-03 ENCOUNTER — Encounter: Payer: Self-pay | Admitting: Internal Medicine

## 2014-04-03 MED ORDER — LIDOCAINE 5 % EX PTCH: 1.0000 | MEDICATED_PATCH | CUTANEOUS | Status: DC

## 2014-04-03 MED ORDER — OXYBUTYNIN CHLORIDE 5 MG PO TABS: 5.0000 mg | ORAL_TABLET | Freq: Every day | ORAL | Status: DC

## 2014-05-13 ENCOUNTER — Other Ambulatory Visit: Payer: Self-pay | Admitting: Internal Medicine

## 2014-06-12 ENCOUNTER — Telehealth: Payer: Self-pay | Admitting: *Deleted

## 2014-06-12 DIAGNOSIS — E119 Type 2 diabetes mellitus without complications: Secondary | ICD-10-CM

## 2014-06-12 NOTE — Telephone Encounter (Signed)
Patient has an upcoming appointment. Lipid panel ordered.

## 2014-06-18 ENCOUNTER — Ambulatory Visit (INDEPENDENT_AMBULATORY_CARE_PROVIDER_SITE_OTHER): Payer: Self-pay | Admitting: Family Medicine

## 2014-06-18 VITALS — BP 124/78 | Wt 236.0 lb

## 2014-06-18 DIAGNOSIS — E118 Type 2 diabetes mellitus with unspecified complications: Secondary | ICD-10-CM

## 2014-06-20 NOTE — Progress Notes (Signed)
Patient presents today for 3 month diabetes follow-up as part of the employer-sponsored Link to Wellness program. Current diabetes regimen includes Metformin and glimepiride. Patient does not take ASA at this time due to increased bruising. Most recent MD follow-up was April 2015. Patient has a pending appt for next wednesday (July 2015). No med changes or major health changes at this time.  Diabetes Assessment: Type of Diabetes: Type 2; Sees Diabetes provider 4 or more times per year; MD managing Diabetes Plotnikov, PCP; checks feet daily; uses glucometer; takes medications as prescribed; hypoglycemia frequency Rare; checks blood glucose 1-2 times a week; does not take an aspirin a day d/c due to bruising; Lowest CBG 100; Highest CBG 240 Other Diabetes History: Current med regimen includes Metformin and glimepiride. Patient continues tolerating these well. Patient does maintain good medication compliance. Patient did not bring meter today and is currently testing 2-3 times per week or when symptomatic. Her glucose strips have expired and patient is unsure if they are giving accurate readings as a result. We will refill testing supplies today. Per patient report glucose is consistenly higher over the previous several weeks, rising to >200 recently. Hypoglycemia frequency is rare. Patient reports continued signs and symptoms of neuropathy including numbness/tingling/burning and is continues taking cymbalta for this but does not feel it provides adequate relief. She will discuss with her PCP the possibility of an alternate agent, since she is not getting adequate relief through duloxetine. Patient is up to date on yearly eye exam. A1c today was 7.1 (slightly elevated from previous 6.8).  Lifestyle Factors: Diet - Patient continues to make small but positive changes to diet recently. She continues limiting carbs such as white breads, pasta, and potatoes. She has increased vegetable and fruit intake. Patient eats  very little meat and attempts to get protein through protein shakes and meal replacement shakes, as they are easy to eat on the go.  Exercise - Not walking recently due to worsened neuropathy in feet and hot weather.   Assessment: Pt presents today for 3 mo follow-up. She continues to suffer from depression and fatigue, but seems much better at this visit. She has made a few small changes in her DM care, but continues to have room for improvement. A1c today was slightly elevated at 7.1 (prev 6.8). We will follow-up in 3 months.  Plan: 1) Continue making healthy dietary choices, continue drinking protein shakes 2) Continue to walk as tolerated 3) Ask [name] about switching to a new medication for neuropathic pain 4) Continue testing regularly 5) Follow-up in 3 months on Wednesday October 7th @ 3:30 pm

## 2014-06-26 ENCOUNTER — Other Ambulatory Visit (INDEPENDENT_AMBULATORY_CARE_PROVIDER_SITE_OTHER): Payer: 59

## 2014-06-26 ENCOUNTER — Ambulatory Visit (INDEPENDENT_AMBULATORY_CARE_PROVIDER_SITE_OTHER): Payer: 59 | Admitting: Internal Medicine

## 2014-06-26 ENCOUNTER — Encounter: Payer: Self-pay | Admitting: Internal Medicine

## 2014-06-26 VITALS — BP 120/82 | HR 84 | Temp 98.5°F | Resp 16 | Ht 63.0 in | Wt 241.0 lb

## 2014-06-26 DIAGNOSIS — E119 Type 2 diabetes mellitus without complications: Secondary | ICD-10-CM

## 2014-06-26 DIAGNOSIS — M545 Low back pain, unspecified: Secondary | ICD-10-CM

## 2014-06-26 DIAGNOSIS — R5381 Other malaise: Secondary | ICD-10-CM

## 2014-06-26 DIAGNOSIS — R5383 Other fatigue: Secondary | ICD-10-CM

## 2014-06-26 LAB — URINALYSIS
Bilirubin Urine: NEGATIVE
Hgb urine dipstick: NEGATIVE
Ketones, ur: NEGATIVE
Leukocytes, UA: NEGATIVE
Nitrite: NEGATIVE
Specific Gravity, Urine: <=1.005 — AB (ref 1.000–1.030)
Total Protein, Urine: NEGATIVE
Urine Glucose: NEGATIVE
Urobilinogen, UA: 0.2 (ref 0.0–1.0)
pH: 7 (ref 5.0–8.0)

## 2014-06-26 LAB — BASIC METABOLIC PANEL
BUN: 25 mg/dL — ABNORMAL HIGH (ref 6–23)
CO2: 32 mEq/L (ref 19–32)
Calcium: 9.4 mg/dL (ref 8.4–10.5)
Chloride: 98 mEq/L (ref 96–112)
Creatinine, Ser: 1.1 mg/dL (ref 0.4–1.2)
GFR: 53.43 mL/min — ABNORMAL LOW (ref >60.00)
Glucose, Bld: 116 mg/dL — ABNORMAL HIGH (ref 70–99)
Potassium: 4.7 mEq/L (ref 3.5–5.1)
Sodium: 137 mEq/L (ref 135–145)

## 2014-06-26 MED ORDER — OXYCODONE HCL 15 MG PO TABS: 15.0000 mg | ORAL_TABLET | Freq: Four times a day (QID) | ORAL | Status: DC | PRN

## 2014-06-26 MED ORDER — OXYCODONE HCL ER 20 MG PO T12A: 20.0000 mg | EXTENDED_RELEASE_TABLET | Freq: Two times a day (BID) | ORAL | Status: DC

## 2014-06-26 MED ORDER — BUPROPION HCL ER (SR) 150 MG PO TB12: ORAL_TABLET | ORAL | Status: DC

## 2014-06-26 MED ORDER — GLUCOSE BLOOD VI STRP: ORAL_STRIP | Status: DC

## 2014-06-26 MED ORDER — OMEPRAZOLE 20 MG PO CPDR: 20.0000 mg | DELAYED_RELEASE_CAPSULE | Freq: Every day | ORAL | Status: DC

## 2014-06-26 MED ORDER — CARVEDILOL 12.5 MG PO TABS
ORAL_TABLET | ORAL | Status: DC
Start: 2014-06-26 — End: 2014-09-25

## 2014-06-26 NOTE — Progress Notes (Signed)
   Subjective:     HPI  C/o fatigue, achyness x 3 d  The patient presents for a follow-up of  chronic hypertension, chronic dyslipidemia, type 2 diabetes controlled with medicines. F/u chronic LBP and B knee pain. F/u B LE swelling (has had it before, used compr hose)   BP Readings from Last 3 Encounters:  06/26/14 120/82  06/20/14 124/78  03/20/14 122/76   Wt Readings from Last 3 Encounters:  06/26/14 241 lb (109.317 kg)  06/20/14 236 lb (107.049 kg)  03/20/14 236 lb (107.049 kg)     Review of Systems  Constitutional: Positive for fatigue. Negative for chills, activity change, appetite change and unexpected weight change.  HENT: Negative for congestion, mouth sores and sinus pressure.   Eyes: Negative for visual disturbance.  Respiratory: Negative for cough and chest tightness.   Genitourinary: Negative for frequency, difficulty urinating and vaginal pain.  Musculoskeletal: Positive for arthralgias, back pain and gait problem.  Skin: Negative for pallor and rash.  Neurological: Negative for dizziness, tremors and weakness.  Psychiatric/Behavioral: Positive for sleep disturbance and dysphoric mood. Negative for suicidal ideas and confusion. The patient is nervous/anxious.          Objective:   Physical Exam  Constitutional: She appears well-developed. No distress.  obese  HENT:  Head: Normocephalic.  Right Ear: External ear normal.  Left Ear: External ear normal.  Nose: Nose normal.  Mouth/Throat: Oropharynx is clear and moist.  Eyes: Conjunctivae are normal. Pupils are equal, round, and reactive to light. Right eye exhibits no discharge. Left eye exhibits no discharge.  Neck: Normal range of motion. Neck supple. No JVD present. No tracheal deviation present. No thyromegaly present.  Cardiovascular: Normal rate, regular rhythm and normal heart sounds.   Pulmonary/Chest: No stridor. No respiratory distress. She has no wheezes.  Abdominal: Soft. Bowel sounds are  normal. She exhibits no distension and no mass. There is no tenderness. There is no rebound and no guarding.  Musculoskeletal: She exhibits edema (trace) and tenderness.  Lymphadenopathy:    She has no cervical adenopathy.  Neurological: She displays normal reflexes. No cranial nerve deficit. She exhibits normal muscle tone. Coordination normal.  Skin: No rash noted. No erythema.  Psychiatric: Her behavior is normal. Judgment and thought content normal.    CXR  Lab Results  Component Value Date   WBC 7.5 07/25/2013   HGB 11.4* 07/25/2013   HCT 34.2* 07/25/2013   PLT 245.0 07/25/2013   GLUCOSE 118* 07/25/2013   CHOL 199 11/08/2011   TRIG 89.0 11/08/2011   HDL 62.00 11/08/2011   LDLCALC 119* 11/08/2011   ALT 19 11/08/2011   AST 19 11/08/2011   NA 139 07/25/2013   K 4.3 07/25/2013   CL 99 07/25/2013   CREATININE 1.0 07/25/2013   BUN 22 07/25/2013   CO2 31 07/25/2013   TSH 1.64 07/25/2013   INR 1.5 02/05/2008   HGBA1C 7.0* 07/25/2013         Assessment & Plan:

## 2014-06-26 NOTE — Assessment & Plan Note (Signed)
Continue with current prescription therapy as reflected on the Med list.  

## 2014-06-26 NOTE — Progress Notes (Signed)
Pre visit review using our clinic review tool, if applicable. No additional management support is needed unless otherwise documented below in the visit note. 

## 2014-06-26 NOTE — Assessment & Plan Note (Signed)
Labs, UA

## 2014-06-26 NOTE — Assessment & Plan Note (Signed)
HgbA1c was 7.1% on 06/12/14 at [facility]

## 2014-07-15 ENCOUNTER — Encounter: Payer: Self-pay | Admitting: Family Medicine

## 2014-07-15 NOTE — Progress Notes (Signed)
Patient ID: [name], female   DOB: 10/20/1951, 63 y.o.   MRN: [id number] Reviewed: Agree with our Pharmacologist's documentation and management.

## 2014-08-05 ENCOUNTER — Other Ambulatory Visit: Payer: Self-pay | Admitting: Internal Medicine

## 2014-08-08 ENCOUNTER — Encounter: Payer: Self-pay | Admitting: Family Medicine

## 2014-08-08 NOTE — Progress Notes (Signed)
Patient ID: [name], female   DOB: June 05, 1951, 63 y.o.   MRN: [id number] Reviewed: Agree with the documentation and management by my [place].

## 2014-08-26 ENCOUNTER — Other Ambulatory Visit: Payer: Self-pay | Admitting: Internal Medicine

## 2014-09-23 ENCOUNTER — Other Ambulatory Visit: Payer: Self-pay | Admitting: Internal Medicine

## 2014-09-25 ENCOUNTER — Ambulatory Visit (INDEPENDENT_AMBULATORY_CARE_PROVIDER_SITE_OTHER): Payer: 59 | Admitting: Internal Medicine

## 2014-09-25 ENCOUNTER — Other Ambulatory Visit (INDEPENDENT_AMBULATORY_CARE_PROVIDER_SITE_OTHER): Payer: 59

## 2014-09-25 ENCOUNTER — Encounter: Payer: Self-pay | Admitting: Internal Medicine

## 2014-09-25 VITALS — BP 140/90 | HR 81 | Temp 98.2°F | Wt 240.0 lb

## 2014-09-25 DIAGNOSIS — M25569 Pain in unspecified knee: Secondary | ICD-10-CM

## 2014-09-25 DIAGNOSIS — M159 Polyosteoarthritis, unspecified: Secondary | ICD-10-CM

## 2014-09-25 DIAGNOSIS — I10 Essential (primary) hypertension: Secondary | ICD-10-CM

## 2014-09-25 DIAGNOSIS — E7800 Pure hypercholesterolemia, unspecified: Secondary | ICD-10-CM

## 2014-09-25 DIAGNOSIS — M8949 Other hypertrophic osteoarthropathy, multiple sites: Secondary | ICD-10-CM

## 2014-09-25 DIAGNOSIS — M150 Primary generalized (osteo)arthritis: Secondary | ICD-10-CM

## 2014-09-25 DIAGNOSIS — E119 Type 2 diabetes mellitus without complications: Secondary | ICD-10-CM

## 2014-09-25 DIAGNOSIS — M5440 Lumbago with sciatica, unspecified side: Secondary | ICD-10-CM

## 2014-09-25 LAB — LIPID PANEL
Cholesterol: 215 mg/dL — ABNORMAL HIGH (ref 0–200)
HDL: 46 mg/dL (ref >39.00)
LDL Cholesterol: 138 mg/dL — ABNORMAL HIGH (ref 0–99)
NonHDL: 169
Total CHOL/HDL Ratio: 5
Triglycerides: 154 mg/dL — ABNORMAL HIGH (ref 0.0–149.0)
VLDL: 30.8 mg/dL (ref 0.0–40.0)

## 2014-09-25 LAB — HEMOGLOBIN A1C: Hgb A1c MFr Bld: 7.6 % — ABNORMAL HIGH (ref 4.6–6.5)

## 2014-09-25 MED ORDER — LORAZEPAM 2 MG PO TABS: ORAL_TABLET | ORAL | Status: DC

## 2014-09-25 MED ORDER — OXYCODONE HCL ER 20 MG PO T12A: 20.0000 mg | EXTENDED_RELEASE_TABLET | Freq: Two times a day (BID) | ORAL | Status: DC

## 2014-09-25 MED ORDER — CARVEDILOL 25 MG PO TABS: 25.0000 mg | ORAL_TABLET | Freq: Two times a day (BID) | ORAL | Status: DC

## 2014-09-25 MED ORDER — OXYCODONE HCL 15 MG PO TABS: 15.0000 mg | ORAL_TABLET | Freq: Four times a day (QID) | ORAL | Status: DC | PRN

## 2014-09-25 MED ORDER — OXYBUTYNIN CHLORIDE 5 MG PO TABS: 2.5000 mg | ORAL_TABLET | Freq: Every day | ORAL | Status: DC

## 2014-09-25 MED ORDER — LIDOCAINE 5 % EX PTCH: 1.0000 | MEDICATED_PATCH | CUTANEOUS | Status: DC

## 2014-09-25 NOTE — Assessment & Plan Note (Signed)
Severe, chronic Knees and low back  Potential benefits of a long term opioids use as well as potential risks (i.e. addiction risk, apnea etc) and complications (i.e. Somnolence, constipation and others) were explained to the patient and were aknowledged.

## 2014-09-25 NOTE — Assessment & Plan Note (Signed)
Chronic and severe. Chronic opioid use.  Potential benefits of a long term opioids use as well as potential risks (i.e. addiction risk, apnea etc) and complications (i.e. Somnolence, constipation and others) were explained to the patient and were aknowledged. Continue with current prescription therapy as reflected on the Med list.

## 2014-09-25 NOTE — Assessment & Plan Note (Signed)
Continue with current prescription therapy as reflected on the Med list.  

## 2014-09-25 NOTE — Progress Notes (Signed)
   Subjective:     HPI  C/o fatigue - chronic. Working 4 10 h days per week  The patient presents for a follow-up of  chronic hypertension, chronic dyslipidemia, type 2 diabetes controlled with medicines. F/u chronic LBP and B knee pain. F/u B LE swelling (has had it before, used compr hose)   BP Readings from Last 3 Encounters:  09/25/14 140/90  06/26/14 120/82  06/20/14 124/78   Wt Readings from Last 3 Encounters:  09/25/14 240 lb (108.863 kg)  06/26/14 241 lb (109.317 kg)  06/20/14 236 lb (107.049 kg)     Review of Systems  Constitutional: Positive for fatigue. Negative for chills, activity change, appetite change and unexpected weight change.  HENT: Negative for congestion, mouth sores and sinus pressure.   Eyes: Negative for visual disturbance.  Respiratory: Negative for cough and chest tightness.   Genitourinary: Negative for frequency, difficulty urinating and vaginal pain.  Musculoskeletal: Positive for arthralgias, back pain and gait problem.  Skin: Negative for pallor and rash.  Neurological: Negative for dizziness, tremors and weakness.  Psychiatric/Behavioral: Positive for sleep disturbance and dysphoric mood. Negative for suicidal ideas and confusion. The patient is nervous/anxious.          Objective:   Physical Exam  Constitutional: She appears well-developed. No distress.  obese  HENT:  Head: Normocephalic.  Right Ear: External ear normal.  Left Ear: External ear normal.  Nose: Nose normal.  Mouth/Throat: Oropharynx is clear and moist.  Eyes: Conjunctivae are normal. Pupils are equal, round, and reactive to light. Right eye exhibits no discharge. Left eye exhibits no discharge.  Neck: Normal range of motion. Neck supple. No JVD present. No tracheal deviation present. No thyromegaly present.  Cardiovascular: Normal rate, regular rhythm and normal heart sounds.   Pulmonary/Chest: No stridor. No respiratory distress. She has no wheezes.  Abdominal:  Soft. Bowel sounds are normal. She exhibits no distension and no mass. There is no tenderness. There is no rebound and no guarding.  Musculoskeletal: She exhibits edema (trace) and tenderness.  Lymphadenopathy:    She has no cervical adenopathy.  Neurological: She displays normal reflexes. No cranial nerve deficit. She exhibits normal muscle tone. Coordination normal.  Skin: No rash noted. No erythema.  Psychiatric: Her behavior is normal. Judgment and thought content normal.    CXR  Lab Results  Component Value Date   WBC 7.5 07/25/2013   HGB 11.4* 07/25/2013   HCT 34.2* 07/25/2013   PLT 245.0 07/25/2013   GLUCOSE 116* 06/26/2014   CHOL 199 11/08/2011   TRIG 89.0 11/08/2011   HDL 62.00 11/08/2011   LDLCALC 119* 11/08/2011   ALT 19 11/08/2011   AST 19 11/08/2011   NA 137 06/26/2014   K 4.7 06/26/2014   CL 98 06/26/2014   CREATININE 1.1 06/26/2014   BUN 25* 06/26/2014   CO2 32 06/26/2014   TSH 1.64 07/25/2013   INR 1.5 02/05/2008   HGBA1C 7.0* 07/25/2013         Assessment & Plan:

## 2014-09-25 NOTE — Assessment & Plan Note (Signed)
Worse NAS diet Will increase Coreg to 25 mg bid

## 2014-09-25 NOTE — Progress Notes (Signed)
Pre visit review using our clinic review tool, if applicable. No additional management support is needed unless otherwise documented below in the visit note. 

## 2014-09-30 ENCOUNTER — Other Ambulatory Visit (HOSPITAL_COMMUNITY): Payer: Self-pay | Admitting: Orthopedic Surgery

## 2014-09-30 DIAGNOSIS — T84062D Wear of articular bearing surface of internal prosthetic right knee joint, subsequent encounter: Secondary | ICD-10-CM

## 2014-10-06 NOTE — Assessment & Plan Note (Signed)
Continue with current prescription therapy as reflected on the Med list.  

## 2014-10-06 NOTE — Assessment & Plan Note (Signed)
Continue with diet.

## 2014-10-09 ENCOUNTER — Encounter (HOSPITAL_COMMUNITY)
Admission: RE | Admit: 2014-10-09 | Discharge: 2014-10-09 | Disposition: A | Discharge status: Home / Self Care | Payer: 59 | Source: Ambulatory Visit | Attending: Orthopedic Surgery | Admitting: Orthopedic Surgery

## 2014-10-09 DIAGNOSIS — T84062D Wear of articular bearing surface of internal prosthetic right knee joint, subsequent encounter: Secondary | ICD-10-CM

## 2014-10-09 DIAGNOSIS — M25569 Pain in unspecified knee: Secondary | ICD-10-CM | POA: Diagnosis present

## 2014-10-09 DIAGNOSIS — M25469 Effusion, unspecified knee: Secondary | ICD-10-CM | POA: Insufficient documentation

## 2014-10-09 DIAGNOSIS — Z96653 Presence of artificial knee joint, bilateral: Secondary | ICD-10-CM | POA: Insufficient documentation

## 2014-10-09 MED ORDER — TECHNETIUM TC 99M MEDRONATE IV KIT
25.0000 | PACK | Freq: Once | INTRAVENOUS | Status: AC | PRN
  Administered 2014-10-09: 25 via INTRAVENOUS

## 2014-10-16 ENCOUNTER — Ambulatory Visit (INDEPENDENT_AMBULATORY_CARE_PROVIDER_SITE_OTHER): Payer: Self-pay | Admitting: Family Medicine

## 2014-10-16 ENCOUNTER — Ambulatory Visit: Payer: 59

## 2014-10-16 VITALS — BP 134/92 | Wt 236.0 lb

## 2014-10-16 DIAGNOSIS — E118 Type 2 diabetes mellitus with unspecified complications: Secondary | ICD-10-CM

## 2014-10-22 NOTE — Progress Notes (Signed)
Patient presents today for 3 month diabetes follow-up as part of the employer-sponsored Link to Wellness program. Current diabetes regimen includes Metformin and glimepiride. Patient does not take ASA at this time due to increased bruising. Does not tolerated statins. Most recent MD follow-up was October 2015. Patient has a pending appt for January 2016. Labwork including A1c and lipids were done at last appt. MD also increased carvedilol to 25 mg twice daily. A1c at last apt was 7.6 (prev 7.1 in July).   Diabetes Assessment: Type of Diabetes: Type 2; Sees Diabetes provider 4 or more times per year; MD managing Diabetes Plotnikov, PCP; checks feet daily; uses glucometer; takes medications as prescribed; hypoglycemia frequency Rare; does not take an aspirin a day d/c due to bruising; checks blood glucose less than 1 time a week Other Diabetes History: Current med regimen includes Metformin 1,000 mg twice daily and glimepiride 1 mg daily. Patient continues tolerating these well. Patient reports medications compliance but does miss doses on occasion according to refill history. Patient did not bring meter today and is not currently testing. She has used One Touch in the past, but will switch to TrueResult given formulary changes for 2016. Hypoglycemia frequency is rare. Patient reports continued signs and symptoms of neuropathy including numbness/tingling/burning and is continues taking cymbalta for this but does not feel it provides adequate relief. She will discuss with her PCP the possibility of an alternate agent, since she is not getting adequate relief through duloxetine. Patient is up to date on yearly eye exam. A1c in October was 7.6 (prev 7.1 in July).  Lifestyle Factors: Diet - No major changes. Patient continues to need improvement in diet. She eats quick meals that often include more carb vs protein. Patient does not like meat and has stopped eating protein shakes. Her niece still lives with her and  cooks some, but patient does not always eat with her. Patient will try to resume protein shakes as meal replacement a few times per week.  Exercise - No exercise at this time due to daily and sometimes severe knee pain. She continues to have a gym membership and reports she could probably tolerated water exercises and would like to resume these at least once per week to start. Patient struggles with fatigue and depression and I believe this to be a significant barrier in her ability to exercise.   Assessment: Pt presents today for 3 mo follow-up. She continues to suffer from depression and fatigue, and this is a barrier to both diet and exercise. She has made no changes to lifestyle recently due to fatigue and daily knee pain. She is being evaluated for this and will hopefully get relief soon. A1c in Oct was slightly elevated at 7.6 (prev7.1 and 6.8). Patient will work on diet, exercise, and med compliance and will follow-up in 3 months.  Plan: 1) Continue making healthy dietary choices, replace meals with protein shakes 2) Resume mild exercise as tolerated, consider going to gym for water exercises once per week 3) Ask [name] about switching to a new medication for neuropathic pain 4) Continue testing regularly 5) Follow-up in 3 months on Wednesday Feb 3rd @ 1:30 pm

## 2014-12-02 ENCOUNTER — Encounter: Payer: Self-pay | Admitting: Family Medicine

## 2014-12-02 NOTE — Progress Notes (Signed)
Patient ID: [name], female   DOB: 09-Dec-1951, 63 y.o.   MRN: [id number] Reviewed: Agree with the documentation and management of our [name].

## 2014-12-13 LAB — HM MAMMOGRAPHY

## 2014-12-18 ENCOUNTER — Other Ambulatory Visit: Payer: Self-pay | Admitting: Obstetrics and Gynecology

## 2014-12-19 LAB — CYTOLOGY - PAP

## 2014-12-27 ENCOUNTER — Encounter: Payer: Self-pay | Admitting: Internal Medicine

## 2014-12-27 ENCOUNTER — Ambulatory Visit (INDEPENDENT_AMBULATORY_CARE_PROVIDER_SITE_OTHER): Payer: 59 | Admitting: Internal Medicine

## 2014-12-27 ENCOUNTER — Other Ambulatory Visit: Payer: 59

## 2014-12-27 VITALS — BP 154/84 | HR 93 | Temp 97.9°F | Ht 63.0 in | Wt 234.2 lb

## 2014-12-27 DIAGNOSIS — D649 Anemia, unspecified: Secondary | ICD-10-CM

## 2014-12-27 DIAGNOSIS — F329 Major depressive disorder, single episode, unspecified: Secondary | ICD-10-CM

## 2014-12-27 DIAGNOSIS — M5440 Lumbago with sciatica, unspecified side: Secondary | ICD-10-CM

## 2014-12-27 DIAGNOSIS — F32A Depression, unspecified: Secondary | ICD-10-CM

## 2014-12-27 DIAGNOSIS — E119 Type 2 diabetes mellitus without complications: Secondary | ICD-10-CM

## 2014-12-27 MED ORDER — OXYCODONE HCL ER 20 MG PO T12A: 20.0000 mg | EXTENDED_RELEASE_TABLET | Freq: Two times a day (BID) | ORAL | Status: DC

## 2014-12-27 MED ORDER — OXYCODONE HCL 15 MG PO TABS: 15.0000 mg | ORAL_TABLET | Freq: Four times a day (QID) | ORAL | Status: DC | PRN

## 2014-12-27 NOTE — Progress Notes (Signed)
   Subjective:     HPI  C/o fatigue - chronic.   Retired Dec 16, 2014  The patient presents for a follow-up of  chronic hypertension, chronic dyslipidemia, type 2 diabetes controlled with medicines. F/u chronic LBP and B knee pain. F/u B LE swelling (has had it before, used compr hose)   BP Readings from Last 3 Encounters:  12/27/14 154/84  10/22/14 134/92  09/25/14 140/90   Wt Readings from Last 3 Encounters:  12/27/14 234 lb 4 oz (106.255 kg)  10/22/14 236 lb (107.049 kg)  09/25/14 240 lb (108.863 kg)     Review of Systems  Constitutional: Positive for fatigue. Negative for chills, activity change, appetite change and unexpected weight change.  HENT: Negative for congestion, mouth sores and sinus pressure.   Eyes: Negative for visual disturbance.  Respiratory: Negative for cough and chest tightness.   Genitourinary: Negative for frequency, difficulty urinating and vaginal pain.  Musculoskeletal: Positive for back pain, arthralgias and gait problem.  Skin: Negative for pallor and rash.  Neurological: Negative for dizziness, tremors and weakness.  Psychiatric/Behavioral: Positive for sleep disturbance and dysphoric mood. Negative for suicidal ideas and confusion. The patient is nervous/anxious.          Objective:   Physical Exam  Constitutional: She appears well-developed. No distress.  obese  HENT:  Head: Normocephalic.  Right Ear: External ear normal.  Left Ear: External ear normal.  Nose: Nose normal.  Mouth/Throat: Oropharynx is clear and moist.  Eyes: Conjunctivae are normal. Pupils are equal, round, and reactive to light. Right eye exhibits no discharge. Left eye exhibits no discharge.  Neck: Normal range of motion. Neck supple. No JVD present. No tracheal deviation present. No thyromegaly present.  Cardiovascular: Normal rate, regular rhythm and normal heart sounds.   Pulmonary/Chest: No stridor. No respiratory distress. She has no wheezes.  Abdominal:  Soft. Bowel sounds are normal. She exhibits no distension and no mass. There is no tenderness. There is no rebound and no guarding.  Musculoskeletal: She exhibits edema (trace) and tenderness.  Lymphadenopathy:    She has no cervical adenopathy.  Neurological: She displays normal reflexes. No cranial nerve deficit. She exhibits normal muscle tone. Coordination normal.  Skin: No rash noted. No erythema.  Psychiatric: Her behavior is normal. Judgment and thought content normal.    CXR  Lab Results  Component Value Date   WBC 7.5 07/25/2013   HGB 11.4* 07/25/2013   HCT 34.2* 07/25/2013   PLT 245.0 07/25/2013   GLUCOSE 116* 06/26/2014   CHOL 215* 09/25/2014   TRIG 154.0* 09/25/2014   HDL 46.00 09/25/2014   LDLCALC 138* 09/25/2014   ALT 19 11/08/2011   AST 19 11/08/2011   NA 137 06/26/2014   K 4.7 06/26/2014   CL 98 06/26/2014   CREATININE 1.1 06/26/2014   BUN 25* 06/26/2014   CO2 32 06/26/2014   TSH 1.64 07/25/2013   INR 1.5 02/05/2008   HGBA1C 7.6* 09/25/2014         Assessment & Plan:

## 2014-12-27 NOTE — Progress Notes (Signed)
Pre visit review using our clinic review tool, if applicable. No additional management support is needed unless otherwise documented below in the visit note. 

## 2014-12-28 NOTE — Assessment & Plan Note (Signed)
Continue with current prescription therapy as reflected on the Med list.  

## 2015-01-15 ENCOUNTER — Ambulatory Visit (INDEPENDENT_AMBULATORY_CARE_PROVIDER_SITE_OTHER): Payer: Self-pay | Admitting: Family Medicine

## 2015-01-15 VITALS — BP 142/92 | Wt 234.0 lb

## 2015-01-15 DIAGNOSIS — E118 Type 2 diabetes mellitus with unspecified complications: Secondary | ICD-10-CM

## 2015-01-15 NOTE — Progress Notes (Signed)
Patient presents today for 3 month diabetes follow-up as part of the employer-sponsored Link to Wellness program. Current diabetes regimen includes Metformin and glimepiride. Patient does not take ASA at this time due to increased bruising. Does not tolerated statins. Most recent MD follow-up was Jan 2016. A1c today via POC testing was 7.3. No medication changes at this time. Of note, patient has recently retired and is excited about her new lifestyle.   Diabetes Assessment: Type of Diabetes: Type 2; Sees Diabetes provider 4 or more times per year; MD managing Diabetes Plotnikov, PCP; checks feet daily; uses glucometer; takes medications as prescribed; hypoglycemia frequency Rare; does not take an aspirin a day d/c due to bruising; checks blood glucose less than 1 time a week; Other Diabetes History: Current med regimen includes Metformin 1,000 mg twice daily and glimepiride 1 mg daily. Patient continues tolerating these well. Patient reports medications compliance but does miss doses on occasion according to refill history. Patient did not bring meter today and is testing a few times per week. She does report testing more often since she has been retired since she is home and has more time. Patient will finish OneTouch supplies and then switch to TrueResult (already has strips, needs meter). Hypoglycemia frequency is rare. Per patient report glucose is 130-140 random. Patient reports continued signs and symptoms of neuropathy including numbness/tingling/burning and is continues taking cymbalta for this but does not feel it provides adequate relief. She applies lidocaine cream (OTC) to toes at night so she can get to sleep as she experiences painful stabbing sensations in toes especially when lying in bed. Patient is up to date on yearly eye exam. A1c today via POC was 7.3 (prev 7.6).  Lifestyle Factors: Diet - Patient has attempted to make healthier dietary choices since retirement. She is now eating fewer  carbs and more vegetables including kale salads. She is also eating more baked chicken, etc. No major changes. Her niece still lives with her and cooks some, but is now doing a no carb, no dairy, no alcohol diet and is helping them both make better diet choices. Patient reports she does not eat out frequently and does not eat fast food often.  Exercise - dependent on knee pain at this time. She plans to resume going to the gym, using pool for walking and water aerobics. She does not like to go when gym is crowded so plans to start mid Feb and plans to go during slow times of the day.   Plan: 1) Continue making healthy dietary choices, replace meals with protein shakes 2) Resume mild exercise as tolerated, consider water aerobics or walking 3) Ask [name] about additional blood pressure control 4) Continue testing regularly 5) Follow-up in 3 months on Tues May 3rd @ 1:30

## 2015-01-23 ENCOUNTER — Ambulatory Visit (AMBULATORY_SURGERY_CENTER): Payer: 59

## 2015-01-23 VITALS — Ht 63.0 in | Wt 240.0 lb

## 2015-01-23 DIAGNOSIS — Z860100 Personal history of colon polyps, unspecified: Secondary | ICD-10-CM

## 2015-01-23 MED ORDER — MOVIPREP 100 G PO SOLR: 1.0000 | Freq: Once | ORAL | Status: DC

## 2015-01-23 NOTE — Progress Notes (Signed)
No allergies to eggs or soy No diet/weight loss No home oxygen No past problems with anesthesia  Has email  Emmi instructions given for colonoscopy

## 2015-01-27 ENCOUNTER — Other Ambulatory Visit: Payer: Self-pay | Admitting: Internal Medicine

## 2015-02-04 ENCOUNTER — Ambulatory Visit (AMBULATORY_SURGERY_CENTER): Payer: 59 | Admitting: Internal Medicine

## 2015-02-04 ENCOUNTER — Encounter: Payer: 59 | Admitting: Internal Medicine

## 2015-02-04 ENCOUNTER — Encounter: Payer: Self-pay | Admitting: Internal Medicine

## 2015-02-04 VITALS — BP 144/77 | HR 70 | Temp 97.3°F | Resp 13 | Ht 63.0 in | Wt 240.0 lb

## 2015-02-04 DIAGNOSIS — D123 Benign neoplasm of transverse colon: Secondary | ICD-10-CM

## 2015-02-04 DIAGNOSIS — K635 Polyp of colon: Secondary | ICD-10-CM

## 2015-02-04 DIAGNOSIS — Z86010 Personal history of colonic polyps: Secondary | ICD-10-CM

## 2015-02-04 DIAGNOSIS — D122 Benign neoplasm of ascending colon: Secondary | ICD-10-CM

## 2015-02-04 LAB — HM COLONOSCOPY

## 2015-02-04 LAB — GLUCOSE, CAPILLARY
Glucose-Capillary: 161 mg/dL — ABNORMAL HIGH (ref 70–99)
Glucose-Capillary: 138 mg/dL — ABNORMAL HIGH (ref 70–99)

## 2015-02-04 MED ORDER — SODIUM CHLORIDE 0.9 % IV SOLN: 500.0000 mL | INTRAVENOUS | Status: DC

## 2015-02-04 NOTE — Progress Notes (Signed)
Called to room to assist during endoscopic procedure.  Patient ID and intended procedure confirmed with present staff. Received instructions for my participation in the procedure from the performing physician.  

## 2015-02-04 NOTE — Progress Notes (Signed)
Patient awakening,vss,report to rn 

## 2015-02-04 NOTE — Patient Instructions (Signed)
Discharge instructions given. Handouts on polyps and diverticulosis. Resume previous medications. YOU HAD AN ENDOSCOPIC PROCEDURE TODAY AT THE [facility]: Refer to the procedure report that was given to you for any specific questions about what was found during the examination.  If the procedure report does not answer your questions, please call your gastroenterologist to clarify.  If you requested that your care partner not be given the details of your procedure findings, then the procedure report has been included in a sealed envelope for you to review at your convenience later.  YOU SHOULD EXPECT: Some feelings of bloating in the abdomen. Passage of more gas than usual.  Walking can help get rid of the air that was put into your GI tract during the procedure and reduce the bloating. If you had a lower endoscopy (such as a colonoscopy or flexible sigmoidoscopy) you may notice spotting of blood in your stool or on the toilet paper. If you underwent a bowel prep for your procedure, then you may not have a normal bowel movement for a few days.  DIET: Your first meal following the procedure should be a light meal and then it is ok to progress to your normal diet.  A half-sandwich or bowl of soup is an example of a good first meal.  Heavy or fried foods are harder to digest and may make you feel nauseous or bloated.  Likewise meals heavy in dairy and vegetables can cause extra gas to form and this can also increase the bloating.  Drink plenty of fluids but you should avoid alcoholic beverages for 24 hours.  ACTIVITY: Your care partner should take you home directly after the procedure.  You should plan to take it easy, moving slowly for the rest of the day.  You can resume normal activity the day after the procedure however you should NOT DRIVE or use heavy machinery for 24 hours (because of the sedation medicines used during the test).    SYMPTOMS TO REPORT IMMEDIATELY: A gastroenterologist  can be reached at any hour.  During normal business hours, 8:30 AM to 5:00 PM Monday through Friday, call [phone].  After hours and on weekends, please call the GI answering service at [phone] who will take a message and have the physician on call contact you.   Following lower endoscopy (colonoscopy or flexible sigmoidoscopy):  Excessive amounts of blood in the stool  Significant tenderness or worsening of abdominal pains  Swelling of the abdomen that is new, acute  Fever of 100F or higher  FOLLOW UP: If any biopsies were taken you will be contacted by phone or by letter within the next 1-3 weeks.  Call your gastroenterologist if you have not heard about the biopsies in 3 weeks.  Our staff will call the home number listed on your records the next business day following your procedure to check on you and address any questions or concerns that you may have at that time regarding the information given to you following your procedure. This is a courtesy call and so if there is no answer at the home number and we have not heard from you through the emergency physician on call, we will assume that you have returned to your regular daily activities without incident.  SIGNATURES/CONFIDENTIALITY: You and/or your care partner have signed paperwork which will be entered into your electronic medical record.  These signatures attest to the fact that that the information above on your After Visit Summary has been reviewed   and is understood.  Full responsibility of the confidentiality of this discharge information lies with you and/or your care-partner. 

## 2015-02-04 NOTE — Op Note (Signed)
[facility]  [address] [place], [id number]   COLONOSCOPY PROCEDURE REPORT  PATIENT: [name], [name]  MR#: [id number] BIRTHDATE: 14-Jan-1951 , 63  yrs. old GENDER: female ENDOSCOPIST: Eustace Quail, MD REFERRED [id number] Program Recall PROCEDURE DATE:  02/04/2015 PROCEDURE:   Colonoscopy with snare polypectomy x 2 First Screening Colonoscopy - Avg.  risk and is 50 yrs.  old or older - No.  Prior Negative Screening - Now for repeat screening. N/A  History of Adenoma - Now for follow-up colonoscopy & has been > or = to 3 yrs.  Yes hx of adenoma.  Has been 3 or more years since last colonoscopy.  Polyps Removed Today? Yes. ASA CLASS:   Class II INDICATIONS:follow up of adenomatous colonic polyp(s). Prior exam 2003 (-), 2009 (tubular adenoma), 2010 (microscopic colitis). MEDICATIONS: Monitored anesthesia care and Propofol 500 mg IV  DESCRIPTION OF PROCEDURE:   After the risks benefits and alternatives of the procedure were thoroughly explained, informed consent was obtained.  The digital rectal exam revealed no abnormalities of the rectum.   The LB [id number]  endoscope was introduced through the anus and advanced to the cecum, which was identified by both the appendix and ileocecal valve. No adverse events experienced.   The quality of the prep was good, using MoviPrep  The instrument was then slowly withdrawn as the colon was fully examined.  COLON FINDINGS: Two polyps ranging between 3-40mm in size were found in the transverse colon and ascending colon.  A polypectomy was performed with a cold snare.  The resection was complete, the polyp tissue was completely retrieved and sent to histology.   There was mild diverticulosis noted in the sigmoid colon.   The examination was otherwise normal.  Retroflexed views revealed no abnormalities. The time to cecum=3 minutes 23 seconds.  Withdrawal time=17 minutes 19 seconds.  The scope was withdrawn and the  procedure completed. COMPLICATIONS: There were no immediate complications.  ENDOSCOPIC IMPRESSION: 1.   Two polyps were found in the transverse colon and ascending colon; polypectomy was performed with a cold snare 2.   Mild diverticulosis was noted in the sigmoid colon 3.   The examination was otherwise normal  RECOMMENDATIONS: 1. Follow up colonoscopy in 5 years  eSigned:  Eustace Quail, MD 02/04/2015 9:48 AM   cc: Alt[place] .  Plotnikov, MD and The Patient

## 2015-02-05 ENCOUNTER — Telehealth: Payer: Self-pay

## 2015-02-05 NOTE — Telephone Encounter (Signed)
  Follow up Call-  Call back number 02/04/2015  Post procedure Call Back phone  # [phone]  Permission to leave phone message Yes     Patient questions:  Do you have a fever, pain , or abdominal swelling? No. Pain Score  0 *  Have you tolerated food without any problems? Yes.    Have you been able to return to your normal activities? Yes.    Do you have any questions about your discharge instructions: Diet   No. Medications  No. Follow up visit  No.  Do you have questions or concerns about your Care? No.  Actions: * If pain score is 4 or above: No action needed, pain <4.

## 2015-02-10 ENCOUNTER — Other Ambulatory Visit: Payer: Self-pay | Admitting: Internal Medicine

## 2015-02-11 ENCOUNTER — Encounter: Payer: Self-pay | Admitting: Internal Medicine

## 2015-02-12 ENCOUNTER — Ambulatory Visit: Payer: Self-pay | Admitting: Orthopedic Surgery

## 2015-02-12 NOTE — Progress Notes (Signed)
Preoperative surgical orders have been place into the Epic hospital system for [name] on 02/12/2015, 5:26 PM  by Mickel Crow for surgery on 03-05-2015.  Preop Total Knee orders including Experal, IV Tylenol, and IV Decadron as long as there are no contraindications to the above medications. Arlee Muslim, PA-C

## 2015-02-13 NOTE — Progress Notes (Signed)
Patient ID: [name], female   DOB: [birth year], 64 y.o.   MRN: [id number] Reviewed: Agree with the documentation and management of our [place].

## 2015-02-25 ENCOUNTER — Ambulatory Visit (INDEPENDENT_AMBULATORY_CARE_PROVIDER_SITE_OTHER): Payer: 59 | Admitting: Internal Medicine

## 2015-02-25 ENCOUNTER — Other Ambulatory Visit (INDEPENDENT_AMBULATORY_CARE_PROVIDER_SITE_OTHER): Payer: 59

## 2015-02-25 ENCOUNTER — Encounter: Payer: Self-pay | Admitting: Internal Medicine

## 2015-02-25 VITALS — BP 150/84 | HR 93 | Temp 98.9°F | Wt 253.0 lb

## 2015-02-25 DIAGNOSIS — J069 Acute upper respiratory infection, unspecified: Secondary | ICD-10-CM

## 2015-02-25 DIAGNOSIS — E119 Type 2 diabetes mellitus without complications: Secondary | ICD-10-CM

## 2015-02-25 DIAGNOSIS — M545 Low back pain, unspecified: Secondary | ICD-10-CM

## 2015-02-25 DIAGNOSIS — D649 Anemia, unspecified: Secondary | ICD-10-CM

## 2015-02-25 LAB — URINALYSIS, ROUTINE W REFLEX MICROSCOPIC
Bilirubin Urine: NEGATIVE
Ketones, ur: NEGATIVE
Leukocytes, UA: NEGATIVE
Nitrite: NEGATIVE
Specific Gravity, Urine: <=1.005 — AB (ref 1.000–1.030)
Total Protein, Urine: NEGATIVE
Urine Glucose: NEGATIVE
Urobilinogen, UA: 0.2 (ref 0.0–1.0)
pH: 6 (ref 5.0–8.0)

## 2015-02-25 LAB — CBC WITH DIFFERENTIAL/PLATELET
Basophils Absolute: 0.1 10*3/uL (ref 0.0–0.1)
Basophils Relative: 0.5 % (ref 0.0–3.0)
Eosinophils Absolute: 0.4 10*3/uL (ref 0.0–0.7)
Eosinophils Relative: 3.6 % (ref 0.0–5.0)
HCT: 30.3 % — ABNORMAL LOW (ref 36.0–46.0)
Hemoglobin: 10.3 g/dL — ABNORMAL LOW (ref 12.0–15.0)
Lymphocytes Relative: 16.8 % (ref 12.0–46.0)
Lymphs Abs: 1.8 10*3/uL (ref 0.7–4.0)
MCHC: 34.1 g/dL (ref 30.0–36.0)
MCV: 87.4 fl (ref 78.0–100.0)
Monocytes Absolute: 0.9 10*3/uL (ref 0.1–1.0)
Monocytes Relative: 8.1 % (ref 3.0–12.0)
Neutro Abs: 7.5 10*3/uL (ref 1.4–7.7)
Neutrophils Relative %: 71 % (ref 43.0–77.0)
Platelets: 236 10*3/uL (ref 150.0–400.0)
RBC: 3.47 Mil/uL — ABNORMAL LOW (ref 3.87–5.11)
RDW: 13.5 % (ref 11.5–15.5)
WBC: 10.6 10*3/uL — ABNORMAL HIGH (ref 4.0–10.5)

## 2015-02-25 LAB — HEMOGLOBIN A1C: Hgb A1c MFr Bld: 7.4 % — ABNORMAL HIGH (ref 4.6–6.5)

## 2015-02-25 MED ORDER — CEFUROXIME AXETIL 500 MG PO TABS: 500.0000 mg | ORAL_TABLET | Freq: Two times a day (BID) | ORAL | Status: DC

## 2015-02-25 NOTE — Progress Notes (Signed)
Pre visit review using our clinic review tool, if applicable. No additional management support is needed unless otherwise documented below in the visit note. 

## 2015-02-25 NOTE — Assessment & Plan Note (Signed)
3/16 ?sinusitis Ceftin 500 mg x 7 d

## 2015-02-25 NOTE — Patient Instructions (Signed)
Use over-the-counter  "cold" medicines  such as"Afrin" nasal spray for nasal congestion as directed instead. Use " Delsym" or" Robitussin" cough syrup varietis for cough.  You can use plain "Tylenol" or "Advil" for fever, chills and achyness. Use Halls or Ricola cough drops.  Please, make an appointment if you are not better or if you're worse.  

## 2015-02-25 NOTE — Progress Notes (Signed)
   Subjective:     Sinusitis This is a new problem. The current episode started in the past 7 days. The problem has been gradually worsening since onset. There has been no fever. Associated symptoms include congestion, sinus pressure and swollen glands. Pertinent negatives include no chills or coughing.   Pt is planning to have her R knee re-done by Dr Wynelle Link on Thursday next week F/u  fatigue - chronic.   Retired Dec 16, 2014  The patient presents for a follow-up of  chronic hypertension, chronic dyslipidemia, type 2 diabetes controlled with medicines. F/u chronic LBP and B knee pain. F/u B LE swelling (has had it before, used compr hose)   BP Readings from Last 3 Encounters:  02/04/15 144/77  01/15/15 142/92  12/27/14 154/84   Wt Readings from Last 3 Encounters:  02/04/15 240 lb (108.863 kg)  01/23/15 240 lb (108.863 kg)  01/15/15 234 lb (106.142 kg)     Review of Systems  Constitutional: Positive for fatigue. Negative for chills, activity change, appetite change and unexpected weight change.  HENT: Positive for congestion and sinus pressure. Negative for mouth sores.   Eyes: Negative for visual disturbance.  Respiratory: Negative for cough and chest tightness.   Genitourinary: Negative for frequency, difficulty urinating and vaginal pain.  Musculoskeletal: Positive for back pain, arthralgias and gait problem.  Skin: Negative for pallor and rash.  Neurological: Negative for dizziness, tremors and weakness.  Psychiatric/Behavioral: Positive for sleep disturbance and dysphoric mood. Negative for suicidal ideas and confusion. The patient is nervous/anxious.          Objective:   Physical Exam  Constitutional: She appears well-developed. No distress.  obese  HENT:  Head: Normocephalic.  Right Ear: External ear normal.  Left Ear: External ear normal.  Nose: Nose normal.  Mouth/Throat: Oropharynx is clear and moist.  Eyes: Conjunctivae are normal. Pupils are equal,  round, and reactive to light. Right eye exhibits no discharge. Left eye exhibits no discharge.  Neck: Normal range of motion. Neck supple. No JVD present. No tracheal deviation present. No thyromegaly present.  Cardiovascular: Normal rate, regular rhythm and normal heart sounds.   Pulmonary/Chest: No stridor. No respiratory distress. She has no wheezes.  Abdominal: Soft. Bowel sounds are normal. She exhibits no distension and no mass. There is no tenderness. There is no rebound and no guarding.  Musculoskeletal: She exhibits edema (trace) and tenderness.  Lymphadenopathy:    She has no cervical adenopathy.  Neurological: She displays normal reflexes. No cranial nerve deficit. She exhibits normal muscle tone. Coordination normal.  Skin: No rash noted. No erythema.  Psychiatric: Her behavior is normal. Judgment and thought content normal.  eryth throat LS is tender  CXR  Lab Results  Component Value Date   WBC 7.5 07/25/2013   HGB 11.4* 07/25/2013   HCT 34.2* 07/25/2013   PLT 245.0 07/25/2013   GLUCOSE 116* 06/26/2014   CHOL 215* 09/25/2014   TRIG 154.0* 09/25/2014   HDL 46.00 09/25/2014   LDLCALC 138* 09/25/2014   ALT 19 11/08/2011   AST 19 11/08/2011   NA 137 06/26/2014   K 4.7 06/26/2014   CL 98 06/26/2014   CREATININE 1.1 06/26/2014   BUN 25* 06/26/2014   CO2 32 06/26/2014   TSH 1.64 07/25/2013   INR 1.5 02/05/2008   HGBA1C 7.6* 09/25/2014         Assessment & Plan:

## 2015-02-25 NOTE — Assessment & Plan Note (Signed)
UA

## 2015-02-26 ENCOUNTER — Encounter (HOSPITAL_COMMUNITY): Payer: Self-pay

## 2015-02-26 ENCOUNTER — Encounter (HOSPITAL_COMMUNITY)
Admission: RE | Admit: 2015-02-26 | Discharge: 2015-02-26 | Disposition: A | Discharge status: Home / Self Care | Payer: 59 | Source: Ambulatory Visit | Attending: Orthopedic Surgery | Admitting: Orthopedic Surgery

## 2015-02-26 DIAGNOSIS — Z01812 Encounter for preprocedural laboratory examination: Secondary | ICD-10-CM | POA: Insufficient documentation

## 2015-02-26 DIAGNOSIS — Z01810 Encounter for preprocedural cardiovascular examination: Secondary | ICD-10-CM | POA: Insufficient documentation

## 2015-02-26 DIAGNOSIS — I10 Essential (primary) hypertension: Secondary | ICD-10-CM | POA: Diagnosis not present

## 2015-02-26 HISTORY — DX: Pneumonia, unspecified organism: J18.9

## 2015-02-26 HISTORY — DX: Chronic sinusitis, unspecified: J32.9

## 2015-02-26 HISTORY — DX: Headache, unspecified: R51.9

## 2015-02-26 HISTORY — DX: Headache: R51

## 2015-02-26 HISTORY — DX: Polyneuropathy, unspecified: G62.9

## 2015-02-26 LAB — URINE MICROSCOPIC-ADD ON

## 2015-02-26 LAB — URINALYSIS, ROUTINE W REFLEX MICROSCOPIC
Bilirubin Urine: NEGATIVE
Glucose, UA: NEGATIVE mg/dL
Ketones, ur: NEGATIVE mg/dL
Leukocytes, UA: NEGATIVE
Nitrite: NEGATIVE
Protein, ur: 30 mg/dL — AB
Specific Gravity, Urine: 1.014 (ref 1.005–1.030)
Urobilinogen, UA: 0.2 mg/dL (ref 0.0–1.0)
pH: 5.5 (ref 5.0–8.0)

## 2015-02-26 LAB — COMPREHENSIVE METABOLIC PANEL
ALT: 17 U/L (ref 0–35)
AST: 18 U/L (ref 0–37)
Albumin: 3.8 g/dL (ref 3.5–5.2)
Alkaline Phosphatase: 69 U/L (ref 39–117)
Anion gap: 9 (ref 5–15)
BUN: 43 mg/dL — ABNORMAL HIGH (ref 6–23)
CO2: 32 mmol/L (ref 19–32)
Calcium: 9.3 mg/dL (ref 8.4–10.5)
Chloride: 94 mmol/L — ABNORMAL LOW (ref 96–112)
Creatinine, Ser: 1.4 mg/dL — ABNORMAL HIGH (ref 0.50–1.10)
GFR calc Af Amer: 45 mL/min — ABNORMAL LOW (ref >90)
GFR calc non Af Amer: 39 mL/min — ABNORMAL LOW (ref >90)
Glucose, Bld: 185 mg/dL — ABNORMAL HIGH (ref 70–99)
Potassium: 4.5 mmol/L (ref 3.5–5.1)
Sodium: 135 mmol/L (ref 135–145)
Total Bilirubin: 0.5 mg/dL (ref 0.3–1.2)
Total Protein: 7.5 g/dL (ref 6.0–8.3)

## 2015-02-26 LAB — SURGICAL PCR SCREEN
MRSA, PCR: NEGATIVE
Staphylococcus aureus: POSITIVE — AB

## 2015-02-26 LAB — CBC
HCT: 32.6 % — ABNORMAL LOW (ref 36.0–46.0)
Hemoglobin: 10.5 g/dL — ABNORMAL LOW (ref 12.0–15.0)
MCH: 29.5 pg (ref 26.0–34.0)
MCHC: 32.2 g/dL (ref 30.0–36.0)
MCV: 91.6 fL (ref 78.0–100.0)
Platelets: 253 10*3/uL (ref 150–400)
RBC: 3.56 MIL/uL — ABNORMAL LOW (ref 3.87–5.11)
RDW: 13.1 % (ref 11.5–15.5)
WBC: 10.5 10*3/uL (ref 4.0–10.5)

## 2015-02-26 LAB — APTT: aPTT: 33 seconds (ref 24–37)

## 2015-02-26 LAB — PROTIME-INR
INR: 1.04 (ref 0.00–1.49)
Prothrombin Time: 13.7 seconds (ref 11.6–15.2)

## 2015-02-26 NOTE — Progress Notes (Addendum)
CBC CMP urinalysis and micro in epic per PAT visit 02/26/2015 sent to Dr Wynelle Link

## 2015-02-26 NOTE — Patient Instructions (Signed)
[place]  02/26/2015   Your procedure is scheduled on:      Wednesday March 05, 2015   Report to [facility] Main Entrance and follow signs to  [facility] arrive at 12:35 PM.   Call this number if you have problems the morning of surgery [phone] or Presurgical Testing [phone].   Remember:  Do not eat food After Midnight but may take clear liquids till 8:35 am day of surgery then nothing by mouth. EAT HEALTHY SNACK NIGHT PRIOR TO SURGERY.  For Living Will and/or Health Care Power Attorney Forms: please provide copy for your medical record, may bring AM of surgery (forms should be already notarized-we do not provide this service).     Take these medicines the morning of surgery with A SIP OF WATER: Bupropion (Wellbutrin);Carvedilol (Coreg);Lorazepam (Ativan) if needed;Omeprazole (Prilosec);Oxycodone if needed                               You may not have any metal on your body including hair pins and piercings  Do not wear jewelry, make-up, lotions, powders, prefumes or deodorant.  Do not shave body hair  48 hours(2 days) of CHG soap use.                Do not bring valuables to the hospital. [place].  Contacts, dentures or bridgework may not be worn into surgery.  Leave suitcase in the car. After surgery it may be brought to your room.  For patients admitted to the hospital, checkout time is 11:00 AM the day of discharge.     Special Instructions: review fact sheets for MRSA information, Blood Transfusion fact sheet, Incentive Spirometry.  Remember: Type/Screen "Blue armsbands"- may not be removed once applied(would result in being retested AM of surgery, if removed). ________________________________________________________________________  [facility] - Preparing for Surgery Before surgery, you can play an important role.  Because skin is not sterile, your skin needs to be as free of germs as possible.  You can  reduce the number of germs on your skin by washing with CHG (chlorahexidine gluconate) soap before surgery.  CHG is an antiseptic cleaner which kills germs and bonds with the skin to continue killing germs even after washing. Please DO NOT use if you have an allergy to CHG or antibacterial soaps.  If your skin becomes reddened/irritated stop using the CHG and inform your nurse when you arrive at Short Stay. Do not shave (including legs and underarms) for at least 48 hours prior to the first CHG shower.  You may shave your face/neck. Please follow these instructions carefully:  1.  Shower with CHG Soap the night before surgery and the  morning of Surgery.  2.  If you choose to wash your hair, wash your hair first as usual with your  normal  shampoo.  3.  After you shampoo, rinse your hair and body thoroughly to remove the  shampoo.                           4.  Use CHG as you would any other liquid soap.  You can apply chg directly  to the skin and wash                       Gently with a scrungie or clean washcloth.  5.  Apply the CHG Soap to your body ONLY FROM THE NECK DOWN.   Do not use on face/ open                           Wound or open sores. Avoid contact with eyes, ears mouth and genitals (private parts).                       Wash face,  Genitals (private parts) with your normal soap.             6.  Wash thoroughly, paying special attention to the area where your surgery  will be performed.  7.  Thoroughly rinse your body with warm water from the neck down.  8.  DO NOT shower/wash with your normal soap after using and rinsing off  the CHG Soap.                9.  Pat yourself dry with a clean towel.            10.  Wear clean pajamas.            11.  Place clean sheets on your bed the night of your first shower and do not  sleep with pets. Day of Surgery : Do not apply any lotions/deodorants the morning of surgery.  Please wear clean clothes to the hospital/surgery center.  FAILURE TO  FOLLOW THESE INSTRUCTIONS MAY RESULT IN THE CANCELLATION OF YOUR SURGERY PATIENT SIGNATURE_________________________________  NURSE SIGNATURE__________________________________  ________________________________________________________________________    CLEAR LIQUID DIET   Foods Allowed                                                                     Foods Excluded  Coffee and tea, regular and decaf                             liquids that you cannot  Plain Jell-O in any flavor                                             see through such as: Fruit ices (not with fruit pulp)                                     milk, soups, orange juice  Iced Popsicles                                    All solid food Carbonated beverages, regular and diet                                    Cranberry, grape and apple juices Sports drinks like Gatorade Lightly seasoned clear broth or consume(fat free) Sugar, honey syrup  Sample Menu Breakfast  Lunch                                     Supper Cranberry juice                    Beef broth                            Chicken broth Jell-O                                     Grape juice                           Apple juice Coffee or tea                        Jell-O                                      Popsicle                                                Coffee or tea                        Coffee or tea  _____________________________________________________________________    Incentive Spirometer  An incentive spirometer is a tool that can help keep your lungs clear and active. This tool measures how well you are filling your lungs with each breath. Taking long deep breaths may help reverse or decrease the chance of developing breathing (pulmonary) problems (especially infection) following:  A long period of time when you are unable to move or be active. BEFORE THE PROCEDURE   If the spirometer includes an  indicator to show your best effort, your nurse or respiratory therapist will set it to a desired goal.  If possible, sit up straight or lean slightly forward. Try not to slouch.  Hold the incentive spirometer in an upright position. INSTRUCTIONS FOR USE   Sit on the edge of your bed if possible, or sit up as far as you can in bed or on a chair.  Hold the incentive spirometer in an upright position.  Breathe out normally.  Place the mouthpiece in your mouth and seal your lips tightly around it.  Breathe in slowly and as deeply as possible, raising the piston or the ball toward the top of the column.  Hold your breath for 3-5 seconds or for as long as possible. Allow the piston or ball to fall to the bottom of the column.  Remove the mouthpiece from your mouth and breathe out normally.  Rest for a few seconds and repeat Steps 1 through 7 at least 10 times every 1-2 hours when you are awake. Take your time and take a few normal breaths between deep breaths.  The spirometer may include an indicator to show your best effort. Use the indicator as a goal to work toward during each repetition.  After each set of 10 deep breaths,  practice coughing to be sure your lungs are clear. If you have an incision (the cut made at the time of surgery), support your incision when coughing by placing a pillow or rolled up towels firmly against it. Once you are able to get out of bed, walk around indoors and cough well. You may stop using the incentive spirometer when instructed by your caregiver.  RISKS AND COMPLICATIONS  Take your time so you do not get dizzy or light-headed.  If you are in pain, you may need to take or ask for pain medication before doing incentive spirometry. It is harder to take a deep breath if you are having pain. AFTER USE  Rest and breathe slowly and easily.  It can be helpful to keep track of a log of your progress. Your caregiver can provide you with a simple table to help  with this. If you are using the spirometer at home, follow these instructions: [place] IF:   You are having difficultly using the spirometer.  You have trouble using the spirometer as often as instructed.  Your pain medication is not giving enough relief while using the spirometer.  You develop fever of 100.5 F (38.1 C) or higher. SEEK IMMEDIATE MEDICAL CARE IF:   You cough up bloody sputum that had not been present before.  You develop fever of 102 F (38.9 C) or greater.  You develop worsening pain at or near the incision site. MAKE SURE YOU:   Understand these instructions.  Will watch your condition.  Will get help right away if you are not doing well or get worse. Document Released: 04/11/2007 Document Revised: 02/21/2012 Document Reviewed: 06/12/2007 [employer] Patient Information 2014 [employer], [place].   ________________________________________________________________________  WHAT IS A BLOOD TRANSFUSION? Blood Transfusion Information  A transfusion is the replacement of blood or some of its parts. Blood is made up of multiple cells which provide different functions.  Red blood cells carry oxygen and are used for blood loss replacement.  White blood cells fight against infection.  Platelets control bleeding.  Plasma helps clot blood.  Other blood products are available for specialized needs, such as hemophilia or other clotting disorders. BEFORE THE TRANSFUSION  Who gives blood for transfusions?   Healthy volunteers who are fully evaluated to make sure their blood is safe. This is blood bank blood. Transfusion therapy is the safest it has ever been in the practice of medicine. Before blood is taken from a donor, a complete history is taken to make sure that person has no history of diseases nor engages in risky social behavior (examples are intravenous drug use or sexual activity with multiple partners). The donor's travel history is screened to  minimize risk of transmitting infections, such as malaria. The donated blood is tested for signs of infectious diseases, such as HIV and hepatitis. The blood is then tested to be sure it is compatible with you in order to minimize the chance of a transfusion reaction. If you or a relative donates blood, this is often done in anticipation of surgery and is not appropriate for emergency situations. It takes many days to process the donated blood. RISKS AND COMPLICATIONS Although transfusion therapy is very safe and saves many lives, the main dangers of transfusion include:   Getting an infectious disease.  Developing a transfusion reaction. This is an allergic reaction to something in the blood you were given. Every precaution is taken to prevent this. The decision to have a blood transfusion has been  considered carefully by your caregiver before blood is given. Blood is not given unless the benefits outweigh the risks. AFTER THE TRANSFUSION  Right after receiving a blood transfusion, you will usually feel much better and more energetic. This is especially true if your red blood cells have gotten low (anemic). The transfusion raises the level of the red blood cells which carry oxygen, and this usually causes an energy increase.  The nurse administering the transfusion will monitor you carefully for complications. HOME CARE INSTRUCTIONS  No special instructions are needed after a transfusion. You may find your energy is better. Speak with your caregiver about any limitations on activity for underlying diseases you may have. SEEK MEDICAL CARE IF:   Your condition is not improving after your transfusion.  You develop redness or irritation at the intravenous (IV) site. SEEK IMMEDIATE MEDICAL CARE IF:  Any of the following symptoms occur over the next 12 hours:  Shaking chills.  You have a temperature by mouth above 102 F (38.9 C), not controlled by medicine.  Chest, back, or muscle  pain.  People around you feel you are not acting correctly or are confused.  Shortness of breath or difficulty breathing.  Dizziness and fainting.  You get a rash or develop hives.  You have a decrease in urine output.  Your urine turns a dark color or changes to pink, red, or brown. Any of the following symptoms occur over the next 10 days:  You have a temperature by mouth above 102 F (38.9 C), not controlled by medicine.  Shortness of breath.  Weakness after normal activity.  The white part of the eye turns yellow (jaundice).  You have a decrease in the amount of urine or are urinating less often.  Your urine turns a dark color or changes to pink, red, or brown. Document Released: 11/26/2000 Document Revised: 02/21/2012 Document Reviewed: 07/15/2008 [facility] Patient Information 2014 [place], [place].  _______________________________________________________________________

## 2015-02-26 NOTE — Progress Notes (Signed)
Your patient has screened at an elevated risk for Obstructive Sleep Apnea using the Stop-Bang Tool during a pre-surgical vist. A score of 4 or greater is an elevated risk. Score of 4. 

## 2015-02-26 NOTE — H&P (Signed)
TOTAL KNEE REVISION ADMISSION H&P  Patient is being admitted for right revision total knee arthroplasty.  Subjective:  Chief Complaint:right knee pain.  HPI: [name], 64 y.o. female, has a history of pain and functional disability in the right knee(s) due to failed previous arthroplasty and patient has failed non-surgical conservative treatments for greater than 12 weeks to include NSAID's and/or analgesics, flexibility and strengthening excercises, use of assistive devices and activity modification. The indications for the revision of the total knee arthroplasty are loosening of one or more components and fracture or mechanical failure of one or components. Onset of symptoms was gradual starting 2 years ago with gradually worsening course since that time.  Prior procedures on the right knee(s) include arthroscopy and arthroplasty.  Patient currently rates pain in the right knee(s) at 8 out of 10 with activity. There is night pain, worsening of pain with activity and weight bearing, pain that interferes with activities of daily living, pain with passive range of motion, crepitus and joint swelling.  Patient has evidence of joint subluxation, prosthetic loosening and she has definitely kicked that femoral component into more valgus consistent with loosening of the femoral component. This condition presents safety issues increasing the risk of falls.  There is no current active infection.  Patient Active Problem List   Diagnosis Date Noted  . URI, acute 09/17/2013  . Swelling of limb-Bilateral leg Left > than right 07/11/2013  . Numbness-Left foot 07/11/2013  . Varicose veins of lower extremities with other complications [id number]  . Left hand pain 02/09/2013  . Right shoulder pain 09/12/2012  . Edema 04/24/2012  . Grief 11/10/2011  . ABSCESS, TOOTH 02/03/2011  . OSTEOPENIA 10/28/2010  . HYPERKALEMIA 07/15/2010  . ARTHRALGIA 04/08/2010  . FATIGUE 04/08/2010  . XEROSTOMIA 02/04/2010  .  ECZEMA 10/29/2009  . TOBACCO USE, QUIT 10/29/2009  . Anemia 06/18/2009  . RENAL INSUFFICIENCY 06/18/2009  . Diarrhea 06/18/2009  . OPACITY, VITREOUS HUMOR 01/15/2009  . COLITIS 10/16/2008  . KNEE PAIN 04/10/2008  . HYPERCHOLESTEROLEMIA 01/10/2008  . ANXIETY 11/08/2007  . Depression 11/08/2007  . Osteoarthritis 11/08/2007  . LOW BACK PAIN 11/08/2007  . Diabetes mellitus type 2, controlled 10/07/2007  . Essential hypertension 10/07/2007  . GERD 10/07/2007  . MICROALBUMINURIA 10/07/2007   Past Medical History  Diagnosis Date  . Diabetes mellitus     type II  . GERD (gastroesophageal reflux disease)   . Hypertension   . IBS (irritable bowel syndrome)   . Depression   . Anxiety   . LBP (low back pain)   . Osteoarthritis   . Colitis 2010    microscopic- Dr Henrene Pastor  . Renal insufficiency 2011  . Osteopenia   . Anemia     Past Surgical History  Procedure Laterality Date  . Foramen magnum ependymoma surgery  2003    Dr Rita Ohara  . Total knee arthroplasty      L 2008, R 2009- Dr Maureen Ralphs  . Joint replacement Bilateral      Current outpatient prescriptions:  .  buPROPion (WELLBUTRIN XL) 150 MG 24 hr tablet, TAKE 1 TABLET BY MOUTH 2 TIMES DAILY., Disp: 180 tablet, Rfl: 3 .  carvedilol (COREG) 25 MG tablet, Take 1 tablet (25 mg total) by mouth 2 (two) times daily with a meal., Disp: 180 tablet, Rfl: 3 .  cevimeline (EVOXAC) 30 MG capsule, TAKE 1 CAPSULE BY MOUTH 3 TIMES DAILY. (Patient taking differently: 2 in the morning and 1 in the evening), Disp: 270 capsule, Rfl:  3 .  cyclobenzaprine (FLEXERIL) 10 MG tablet, TAKE 1 TABLET BY MOUTH 3 TIMES DAILY AS NEEDED FOR BACK PAIN (Patient taking differently: TAKE 3 TABLETS AT BEDTIME), Disp: 270 tablet, Rfl: 3 .  DULoxetine (CYMBALTA) 60 MG capsule, Take 1 capsule (60 mg total) by mouth daily. (Patient taking differently: Take 60 mg by mouth at bedtime. ), Disp: 90 capsule, Rfl: 3 .  furosemide (LASIX) 40 MG tablet, TAKE 1/2 TO 1 TABLET BY  MOUTH ONCE DAILY AS NEEDED (Patient taking differently: TAKE 1/2 TO 1 TABLET BY MOUTH ONCE DAILY AS NEEDED FOR SWELLING), Disp: 90 tablet, Rfl: 3 .  glimepiride (AMARYL) 1 MG tablet, TAKE 1 TABLET BY MOUTH DAILY BEFORE BREAKFAST., Disp: 90 tablet, Rfl: PRN .  ibuprofen (ADVIL,MOTRIN) 200 MG tablet, Take 800 mg by mouth 2 (two) times daily as needed (Pain)., Disp: , Rfl:  .  lidocaine (LIDODERM) 5 %, Place 1-2 patches onto the skin as directed. (Patient taking differently: Place 1-3 patches onto the skin daily as needed (Pain). ), Disp: 60 patch, Rfl: 4 .  LORazepam (ATIVAN) 2 MG tablet, TAKE 1/2 TABLET BY MOUTH EVERY 6 HOURS AS NEEDED FOR ANXIETY, Disp: 60 tablet, Rfl: 2 .  metFORMIN (GLUCOPHAGE) 1000 MG tablet, TAKE 1 TABLET BY MOUTH 2 TIMES DAILY WITH A MEAL, Disp: 180 tablet, Rfl: 3 .  omeprazole (PRILOSEC) 20 MG capsule, Take 1 capsule (20 mg total) by mouth daily., Disp: 90 capsule, Rfl: 3 .  oxybutynin (DITROPAN) 5 MG tablet, Take 0.5 tablets (2.5 mg total) by mouth daily., Disp: 45 tablet, Rfl: 3 .  OxyCODONE (OXYCONTIN) 20 mg T12A 12 hr tablet, Take 1 tablet (20 mg total) by mouth every 12 (twelve) hours. Please fill on or after 02/25/15, Disp: 60 tablet, Rfl: 0 .  oxyCODONE (ROXICODONE) 15 MG immediate release tablet, Take 1 tablet (15 mg total) by mouth every 4 hours as needed.  .  polyvinyl alcohol (LIQUIFILM TEARS) 1.4 % ophthalmic solution, Place 1 drop into both eyes daily as needed for dry eyes., Disp: , Rfl:  .  cefUROXime (CEFTIN) 500 MG tablet, Take 1 tablet (500 mg total) by mouth 2 (two) times daily., Disp: 14 tablet, Rfl: 0 .  Cholecalciferol (EQL VITAMIN D3) 1000 UNITS tablet, Take 1,000 Units by mouth 2 (two) times daily. , Disp: , Rfl:  .  glucose blood (ONE TOUCH TEST STRIPS) test strip, Use as instructed, Disp: 50 each, Rfl: 3 .  Lancets (ONETOUCH ULTRASOFT) lancets, Use as instructed, Disp: 100 each, Rfl: 3 .  LUTEIN PO, Take 1 tablet by mouth daily., Disp: , Rfl:  .   Multiple Vitamin (MULTIVITAMIN WITH MINERALS) TABS tablet, Take 1 tablet by mouth daily., Disp: , Rfl:  .  Omega-3 Fatty Acids (FISH OIL) 1200 MG CAPS, Take 1,200 mg by mouth 2 (two) times daily., Disp: , Rfl:  .  PSYLLIUM HUSK PO, Take 1.56 g by mouth 2 (two) times daily., Disp: , Rfl:   Allergies  Allergen Reactions  . Erythromycin Diarrhea and Nausea And Vomiting    History  Substance Use Topics  . Smoking status: Former Smoker    Quit date: 12/13/1988  . Smokeless tobacco: Never Used  . Alcohol Use: 0.0 oz/week    0 Standard drinks or equivalent per week     Comment: rarely    Family History  Problem Relation Age of Onset  . Crohn's disease Maternal Uncle   . Diabetes Other   . Depression Mother   . Hypertension Mother   .  Stroke Mother 60  . Diabetes Mother   . Diabetes Father   . Hyperlipidemia Father   . Hypertension Father   . Cancer Brother     Prostate  . Diabetes Brother   . Heart disease Brother   . Hyperlipidemia Brother   . Hypertension Brother   . Diabetes Brother   . Heart disease Brother     Heart Disease before age 97  . Heart attack Brother   . Stroke Brother     X's 2  . Hyperlipidemia Brother   . Hypertension Brother   . Colon cancer Neg Hx       Review of Systems  Constitutional: Negative.   HENT: Negative for congestion, ear discharge, ear pain, hearing loss, nosebleeds, sore throat and tinnitus.   Eyes: Negative.   Respiratory: Negative.  Negative for stridor.   Cardiovascular: Negative.   Gastrointestinal: Positive for constipation. Negative for heartburn, nausea, vomiting, abdominal pain, diarrhea, blood in stool and melena.  Genitourinary: Positive for frequency. Negative for dysuria, urgency, hematuria and flank pain.  Musculoskeletal: Positive for back pain and joint pain. Negative for myalgias, falls and neck pain.       Right knee pain  Skin: Negative.   Neurological: Positive for headaches. Negative for dizziness, tingling,  tremors, sensory change, speech change, focal weakness, seizures and loss of consciousness.  Endo/Heme/Allergies: Negative.   Psychiatric/Behavioral: Negative for depression, suicidal ideas, hallucinations, memory loss and substance abuse. The patient has insomnia. The patient is not nervous/anxious.      Objective:  Physical Exam  Constitutional: She is oriented to person, place, and time. She appears well-developed. No distress.  Morbidly obese  HENT:  Head: Normocephalic and atraumatic.  Right Ear: External ear normal.  Left Ear: External ear normal.  Nose: Nose normal.  Mouth/Throat: Oropharynx is clear and moist.  Eyes: Conjunctivae and EOM are normal.  Neck: Normal range of motion. Neck supple.  Cardiovascular: Normal rate, regular rhythm, normal heart sounds and intact distal pulses.   No murmur heard. Respiratory: Effort normal and breath sounds normal. No respiratory distress. She has no wheezes.  GI: Soft. Bowel sounds are normal. She exhibits no distension. There is no tenderness.  Musculoskeletal:       Right hip: Normal.       Left hip: Normal.       Right knee: She exhibits swelling and effusion. She exhibits no erythema. Tenderness found. Medial joint line and lateral joint line tenderness noted.       Left knee: Normal.  She has a very slight effusion in the right knee. She is very tender distal lateral femur. Palpation there reproduces a lot of her pain. She has worsening valgus deformity of the knee. There is no instability about the knee.  Neurological: She is alert and oriented to person, place, and time. She has normal strength and normal reflexes. No sensory deficit.  Skin: No rash noted. She is not diaphoretic. No erythema.  Psychiatric: She has a normal mood and affect. Her behavior is normal.    Vital signs in last 24 hours: Temp:  [98.2 F (36.8 C)] 98.2 F (36.8 C) (03/16 1402) Pulse Rate:  [90] 90 (03/16 1402) Resp:  [16] 16 (03/16 1402) BP:  (137)/(68) 137/68 mmHg (03/16 1402) SpO2:  [95 %] 95 % (03/16 1402) Weight:  [110.224 kg (243 lb)] 110.224 kg (243 lb) (03/16 1402)  Labs:  Estimated body mass index is 41.82 kg/(m^2) as calculated from the following:   Height  as of 06/26/14: 5\' 3"  (1.6 m).   Weight as of 10/16/14: 107.049 kg (236 lb).  Imaging Review Plain radiographs demonstrate severe degenerative joint disease of the right knee(s). The overall alignment is mild valgus.There is evidence of loosening of the femoral components. The bone quality appears to be good for age and reported activity level.  Assessment/Plan:  End stage primary osteoarthritis, right knee(s) with failed previous arthroplasty.   The patient history, physical examination, clinical judgment of the provider and imaging studies are consistent with end stage degenerative joint disease of the right knee(s), previous total knee arthroplasty. Revision total knee arthroplasty is deemed medically necessary. The treatment options including medical management, injection therapy, arthroscopy and revision arthroplasty were discussed at length. The risks and benefits of revision total knee arthroplasty were presented and reviewed. The risks due to aseptic loosening, infection, stiffness, patella tracking problems, thromboembolic complications and other imponderables were discussed. The patient acknowledged the explanation, agreed to proceed with the plan and consent was signed. Patient is being admitted for inpatient treatment for surgery, pain control, PT, OT, prophylactic antibiotics, VTE prophylaxis, progressive ambulation and ADL's and discharge planning.The patient is planning to be discharged home with home health services    TXA IV PCP: Dr. Crisoforo Oxford, PA-C

## 2015-02-27 ENCOUNTER — Encounter: Payer: Self-pay | Admitting: Family

## 2015-02-27 ENCOUNTER — Ambulatory Visit (INDEPENDENT_AMBULATORY_CARE_PROVIDER_SITE_OTHER): Payer: 59 | Admitting: Family

## 2015-02-27 VITALS — BP 168/92 | HR 81 | Temp 98.2°F | Resp 18 | Ht 63.0 in | Wt 245.1 lb

## 2015-02-27 DIAGNOSIS — R0781 Pleurodynia: Secondary | ICD-10-CM | POA: Insufficient documentation

## 2015-02-27 DIAGNOSIS — J069 Acute upper respiratory infection, unspecified: Secondary | ICD-10-CM

## 2015-02-27 NOTE — Progress Notes (Addendum)
Surgical screening in epic per PAT visit 02/26/2015 positive for Staph. Results sent to Dr Wynelle Link. Prescription for Mupuriocin Ointment called to [facility] spoke with Gerald Stabs. Pt notified.

## 2015-02-27 NOTE — Progress Notes (Signed)
Subjective:    Patient ID: [name], female    DOB: March 01, 1951, 64 y.o.   MRN: [id number]  Chief Complaint  Patient presents with  . Follow-up    has pain in the back of her ribs, drainage, congestion, coughing very little, having surgery next week and wants to make sure she is ok    HPI:  [name] is a 64 y.o. female who presents today for follow up.   This is a continuation of a previous problem. Was recently seen in the clinic and diagnosed with an upper respiratory infection and was placed on Ceftin. She continues to experience the associated symptoms of drainage, congestion, and cough which has been going on for for over a week. Indicates this morning that she was not able to get out of bed secondary to pain in the back of ribs. Pain is described as sharp mainly with deep breath and movements. Intensity of the pain is an 8/10. Notes that she has had some improvement in her illness symptoms since starting the Ceftin.   Allergies  Allergen Reactions  . Erythromycin Diarrhea and Nausea And Vomiting    Current Outpatient Prescriptions on File Prior to Visit  Medication Sig Dispense Refill  . buPROPion (WELLBUTRIN XL) 150 MG 24 hr tablet TAKE 1 TABLET BY MOUTH 2 TIMES DAILY. 180 tablet 3  . carvedilol (COREG) 25 MG tablet Take 1 tablet (25 mg total) by mouth 2 (two) times daily with a meal. 180 tablet 3  . cefUROXime (CEFTIN) 500 MG tablet Take 1 tablet (500 mg total) by mouth 2 (two) times daily. 14 tablet 0  . cevimeline (EVOXAC) 30 MG capsule TAKE 1 CAPSULE BY MOUTH 3 TIMES DAILY. (Patient taking differently: 2 in the morning and 1 in the evening) 270 capsule 3  . Cholecalciferol (EQL VITAMIN D3) 1000 UNITS tablet Take 1,000 Units by mouth 2 (two) times daily.     . cyclobenzaprine (FLEXERIL) 10 MG tablet TAKE 1 TABLET BY MOUTH 3 TIMES DAILY AS NEEDED FOR BACK PAIN (Patient taking differently: TAKE 3 TABLETS AT BEDTIME) 270 tablet 3  . DULoxetine (CYMBALTA) 60 MG capsule  Take 1 capsule (60 mg total) by mouth daily. (Patient taking differently: Take 60 mg by mouth at bedtime. ) 90 capsule 3  . furosemide (LASIX) 40 MG tablet TAKE 1/2 TO 1 TABLET BY MOUTH ONCE DAILY AS NEEDED (Patient taking differently: TAKE 1/2 TO 1 TABLET BY MOUTH ONCE DAILY AS NEEDED FOR SWELLING) 90 tablet 3  . glimepiride (AMARYL) 1 MG tablet TAKE 1 TABLET BY MOUTH DAILY BEFORE BREAKFAST. 90 tablet PRN  . glucose blood (ONE TOUCH TEST STRIPS) test strip Use as instructed 50 each 3  . ibuprofen (ADVIL,MOTRIN) 200 MG tablet Take 800 mg by mouth 2 (two) times daily as needed (Pain).    . Lancets (ONETOUCH ULTRASOFT) lancets Use as instructed 100 each 3  . lidocaine (LIDODERM) 5 % Place 1-2 patches onto the skin as directed. (Patient taking differently: Place 1-3 patches onto the skin daily as needed (Pain). ) 60 patch 4  . LORazepam (ATIVAN) 2 MG tablet TAKE 1/2 TABLET BY MOUTH EVERY 6 HOURS AS NEEDED FOR ANXIETY 60 tablet 2  . LUTEIN PO Take 1 tablet by mouth daily.    . metFORMIN (GLUCOPHAGE) 1000 MG tablet TAKE 1 TABLET BY MOUTH 2 TIMES DAILY WITH A MEAL 180 tablet 3  . Multiple Vitamin (MULTIVITAMIN WITH MINERALS) TABS tablet Take 1 tablet by mouth daily.    Marland Kitchen  Omega-3 Fatty Acids (FISH OIL) 1200 MG CAPS Take 1,200 mg by mouth 2 (two) times daily.    Marland Kitchen omeprazole (PRILOSEC) 20 MG capsule Take 1 capsule (20 mg total) by mouth daily. 90 capsule 3  . oxybutynin (DITROPAN) 5 MG tablet Take 0.5 tablets (2.5 mg total) by mouth daily. 45 tablet 3  . OxyCODONE (OXYCONTIN) 20 mg T12A 12 hr tablet Take 1 tablet (20 mg total) by mouth every 12 (twelve) hours. Please fill on or after 02/25/15 60 tablet 0  . oxyCODONE (ROXICODONE) 15 MG immediate release tablet Take 1 tablet (15 mg total) by mouth every 6 (six) hours as needed. Fill on or after 01/27/15 (Patient taking differently: Take 15 mg by mouth every 4 (four) hours as needed for pain. Fill on or after 01/27/15) 120 tablet 0  . polyvinyl alcohol (LIQUIFILM  TEARS) 1.4 % ophthalmic solution Place 1 drop into both eyes daily as needed for dry eyes.    . PSYLLIUM HUSK PO Take 1.56 g by mouth 2 (two) times daily.     No current facility-administered medications on file prior to visit.    Review of Systems  Constitutional: Negative for fever and chills.  HENT: Positive for congestion and sinus pressure. Negative for sore throat.   Respiratory: Positive for cough. Negative for chest tightness and shortness of breath.       Objective:    BP 168/92 mmHg  Pulse 81  Temp(Src) 98.2 F (36.8 C) (Oral)  Resp 18  Ht 5\' 3"  (1.6 m)  Wt 245 lb 1.9 oz (111.186 kg)  BMI 43.43 kg/m2  SpO2 94% Nursing note and vital signs reviewed.  Physical Exam  Constitutional: She is oriented to person, place, and time. She appears well-developed and well-nourished. No distress.  HENT:  Right Ear: Hearing, tympanic membrane, external ear and ear canal normal.  Left Ear: Hearing, tympanic membrane, external ear and ear canal normal.  Nose: Right sinus exhibits maxillary sinus tenderness and frontal sinus tenderness. Left sinus exhibits maxillary sinus tenderness and frontal sinus tenderness.  Cardiovascular: Normal rate, regular rhythm, normal heart sounds and intact distal pulses.   Pulmonary/Chest: Effort normal and breath sounds normal.  Musculoskeletal:  No obvious deformity, discoloration, or edema of left ribs/thoracic spine noted. Palpable tenderness inferior to the scapula. Left shoulder range of motion is full and thoracic spine motion intact and appropriate.  Neurological: She is alert and oriented to person, place, and time.  Skin: Skin is warm and dry.  Psychiatric: She has a normal mood and affect. Her behavior is normal. Judgment and thought content normal.       Assessment & Plan:

## 2015-02-27 NOTE — Assessment & Plan Note (Signed)
Idiopathic left-sided rib/thoracic pain. Most likely related from coughing. No spasm noted. Continue over-the-counter medications as needed for symptom relief. Follow-up if symptoms worsen or fail to improve. Consider imaging if no improvement in next 24-48 hours.

## 2015-02-27 NOTE — Patient Instructions (Signed)
Thank you for choosing [facility].  Summary/Instructions:  If your symptoms worsen or fail to improve, please contact our office for further instruction, or in case of emergency go directly to the emergency room at the closest medical facility.     

## 2015-02-27 NOTE — Progress Notes (Signed)
Pre visit review using our clinic review tool, if applicable. No additional management support is needed unless otherwise documented below in the visit note. 

## 2015-02-27 NOTE — Assessment & Plan Note (Signed)
Symptoms are resolving with Ceftin. Continue Ceftin until completed. Continue over the counter medication as needed for symptom relief and supportive care. Follow up if symptoms worsen or fail to improve.

## 2015-03-05 ENCOUNTER — Encounter (HOSPITAL_COMMUNITY): Payer: Self-pay | Admitting: *Deleted

## 2015-03-05 ENCOUNTER — Inpatient Hospital Stay (HOSPITAL_COMMUNITY): Payer: 59 | Admitting: Anesthesiology

## 2015-03-05 ENCOUNTER — Encounter (HOSPITAL_COMMUNITY)
Admission: RE | Disposition: A | Discharge status: Home Health | Payer: Self-pay | Source: Ambulatory Visit | Attending: Orthopedic Surgery

## 2015-03-05 ENCOUNTER — Inpatient Hospital Stay (HOSPITAL_COMMUNITY)
Admission: RE | Admit: 2015-03-05 | Discharge: 2015-03-07 | DRG: 467 | Disposition: A | Discharge status: Home Health | Payer: 59 | Source: Ambulatory Visit | Attending: Orthopedic Surgery | Admitting: Orthopedic Surgery

## 2015-03-05 DIAGNOSIS — Z01812 Encounter for preprocedural laboratory examination: Secondary | ICD-10-CM

## 2015-03-05 DIAGNOSIS — F329 Major depressive disorder, single episode, unspecified: Secondary | ICD-10-CM | POA: Diagnosis present

## 2015-03-05 DIAGNOSIS — Z87891 Personal history of nicotine dependence: Secondary | ICD-10-CM | POA: Diagnosis not present

## 2015-03-05 DIAGNOSIS — Z833 Family history of diabetes mellitus: Secondary | ICD-10-CM | POA: Diagnosis not present

## 2015-03-05 DIAGNOSIS — E119 Type 2 diabetes mellitus without complications: Secondary | ICD-10-CM | POA: Diagnosis present

## 2015-03-05 DIAGNOSIS — Z6841 Body Mass Index (BMI) 40.0 and over, adult: Secondary | ICD-10-CM | POA: Diagnosis not present

## 2015-03-05 DIAGNOSIS — Y838 Other surgical procedures as the cause of abnormal reaction of the patient, or of later complication, without mention of misadventure at the time of the procedure: Secondary | ICD-10-CM | POA: Diagnosis present

## 2015-03-05 DIAGNOSIS — T84012A Broken internal right knee prosthesis, initial encounter: Secondary | ICD-10-CM

## 2015-03-05 DIAGNOSIS — M25561 Pain in right knee: Secondary | ICD-10-CM | POA: Diagnosis present

## 2015-03-05 DIAGNOSIS — Z79899 Other long term (current) drug therapy: Secondary | ICD-10-CM

## 2015-03-05 DIAGNOSIS — K219 Gastro-esophageal reflux disease without esophagitis: Secondary | ICD-10-CM | POA: Diagnosis present

## 2015-03-05 DIAGNOSIS — T84032A Mechanical loosening of internal right knee prosthetic joint, initial encounter: Secondary | ICD-10-CM | POA: Diagnosis present

## 2015-03-05 DIAGNOSIS — M179 Osteoarthritis of knee, unspecified: Secondary | ICD-10-CM | POA: Diagnosis present

## 2015-03-05 DIAGNOSIS — I10 Essential (primary) hypertension: Secondary | ICD-10-CM | POA: Diagnosis present

## 2015-03-05 DIAGNOSIS — M1710 Unilateral primary osteoarthritis, unspecified knee: Secondary | ICD-10-CM | POA: Diagnosis present

## 2015-03-05 HISTORY — PX: TOTAL KNEE REVISION: SHX996

## 2015-03-05 LAB — GLUCOSE, CAPILLARY
Glucose-Capillary: 162 mg/dL — ABNORMAL HIGH (ref 70–99)
Glucose-Capillary: 119 mg/dL — ABNORMAL HIGH (ref 70–99)
Glucose-Capillary: 237 mg/dL — ABNORMAL HIGH (ref 70–99)
Glucose-Capillary: 270 mg/dL — ABNORMAL HIGH (ref 70–99)

## 2015-03-05 LAB — TYPE AND SCREEN
ABO/RH(D): O POS
Antibody Screen: NEGATIVE

## 2015-03-05 SURGERY — TOTAL KNEE REVISION
Anesthesia: Spinal | Site: Knee | Laterality: Right

## 2015-03-05 MED ORDER — ONDANSETRON HCL 4 MG PO TABS: 4.0000 mg | ORAL_TABLET | Freq: Four times a day (QID) | ORAL | Status: DC | PRN

## 2015-03-05 MED ORDER — PANTOPRAZOLE SODIUM 40 MG PO TBEC
40.0000 mg | DELAYED_RELEASE_TABLET | Freq: Every day | ORAL | Status: DC
  Filled 2015-03-05: qty 1

## 2015-03-05 MED ORDER — FENTANYL CITRATE 0.05 MG/ML IJ SOLN
INTRAMUSCULAR | Status: DC | PRN
  Administered 2015-03-05: 50 ug via INTRAVENOUS

## 2015-03-05 MED ORDER — TRANEXAMIC ACID 100 MG/ML IV SOLN
1000.0000 mg | INTRAVENOUS | Status: AC
  Administered 2015-03-05: 1000 mg via INTRAVENOUS
  Filled 2015-03-05: qty 10

## 2015-03-05 MED ORDER — PHENYLEPHRINE HCL 10 MG/ML IJ SOLN
INTRAMUSCULAR | Status: AC
  Filled 2015-03-05: qty 1

## 2015-03-05 MED ORDER — ONDANSETRON HCL 4 MG/2ML IJ SOLN
INTRAMUSCULAR | Status: DC | PRN
  Administered 2015-03-05: 4 mg via INTRAVENOUS

## 2015-03-05 MED ORDER — CEFAZOLIN SODIUM-DEXTROSE 2-3 GM-% IV SOLR
2.0000 g | Freq: Four times a day (QID) | INTRAVENOUS | Status: AC
  Administered 2015-03-05 – 2015-03-06 (×2): 2 g via INTRAVENOUS
  Filled 2015-03-05 (×2): qty 50

## 2015-03-05 MED ORDER — POLYETHYLENE GLYCOL 3350 17 G PO PACK: 17.0000 g | PACK | Freq: Every day | ORAL | Status: DC | PRN

## 2015-03-05 MED ORDER — PROPOFOL 10 MG/ML IV BOLUS
INTRAVENOUS | Status: AC
  Filled 2015-03-05: qty 20

## 2015-03-05 MED ORDER — FUROSEMIDE 40 MG PO TABS
40.0000 mg | ORAL_TABLET | Freq: Every day | ORAL | Status: DC | PRN
  Filled 2015-03-05: qty 1

## 2015-03-05 MED ORDER — METHOCARBAMOL 1000 MG/10ML IJ SOLN
500.0000 mg | Freq: Four times a day (QID) | INTRAVENOUS | Status: DC | PRN
  Administered 2015-03-05: 500 mg via INTRAVENOUS
  Filled 2015-03-05 (×2): qty 5

## 2015-03-05 MED ORDER — SODIUM CHLORIDE 0.9 % IJ SOLN
INTRAMUSCULAR | Status: DC | PRN
Start: 2015-03-05 — End: 2015-03-05
  Administered 2015-03-05: 30 mL via INTRAVENOUS

## 2015-03-05 MED ORDER — FLEET ENEMA 7-19 GM/118ML RE ENEM: 1.0000 | ENEMA | Freq: Once | RECTAL | Status: AC | PRN

## 2015-03-05 MED ORDER — MIDAZOLAM HCL 5 MG/5ML IJ SOLN
INTRAMUSCULAR | Status: DC | PRN
  Administered 2015-03-05: 2 mg via INTRAVENOUS

## 2015-03-05 MED ORDER — DEXAMETHASONE SODIUM PHOSPHATE 10 MG/ML IJ SOLN
10.0000 mg | Freq: Once | INTRAMUSCULAR | Status: AC
  Administered 2015-03-06: 10 mg via INTRAVENOUS
  Filled 2015-03-05: qty 1

## 2015-03-05 MED ORDER — BUPIVACAINE HCL 0.25 % IJ SOLN
INTRAMUSCULAR | Status: DC | PRN
  Administered 2015-03-05: 20 mL

## 2015-03-05 MED ORDER — PHENOL 1.4 % MT LIQD: 1.0000 | OROMUCOSAL | Status: DC | PRN

## 2015-03-05 MED ORDER — ACETAMINOPHEN 10 MG/ML IV SOLN
INTRAVENOUS | Status: DC | PRN
  Administered 2015-03-05: 1000 mg via INTRAVENOUS

## 2015-03-05 MED ORDER — CEVIMELINE HCL 30 MG PO CAPS: 30.0000 mg | ORAL_CAPSULE | Freq: Three times a day (TID) | ORAL | Status: DC

## 2015-03-05 MED ORDER — OXYCODONE HCL 5 MG PO TABS
15.0000 mg | ORAL_TABLET | ORAL | Status: DC | PRN
  Administered 2015-03-05 – 2015-03-06 (×5): 20 mg via ORAL
  Administered 2015-03-06: 15 mg via ORAL
  Administered 2015-03-06: 20 mg via ORAL
  Administered 2015-03-07 (×3): 15 mg via ORAL
  Filled 2015-03-05 (×2): qty 4
  Filled 2015-03-05: qty 3
  Filled 2015-03-05: qty 4
  Filled 2015-03-05: qty 3
  Filled 2015-03-05 (×4): qty 4
  Filled 2015-03-05: qty 3

## 2015-03-05 MED ORDER — DIPHENHYDRAMINE HCL 12.5 MG/5ML PO ELIX: 12.5000 mg | ORAL_SOLUTION | ORAL | Status: DC | PRN

## 2015-03-05 MED ORDER — INSULIN ASPART 100 UNIT/ML ~~LOC~~ SOLN
0.0000 [IU] | Freq: Three times a day (TID) | SUBCUTANEOUS | Status: DC
  Administered 2015-03-06: 5 [IU] via SUBCUTANEOUS
  Administered 2015-03-06 – 2015-03-07 (×3): 3 [IU] via SUBCUTANEOUS
  Administered 2015-03-07: 2 [IU] via SUBCUTANEOUS

## 2015-03-05 MED ORDER — DEXAMETHASONE SODIUM PHOSPHATE 10 MG/ML IJ SOLN
10.0000 mg | Freq: Once | INTRAMUSCULAR | Status: AC
  Administered 2015-03-05: 10 mg via INTRAVENOUS

## 2015-03-05 MED ORDER — 0.9 % SODIUM CHLORIDE (POUR BTL) OPTIME
TOPICAL | Status: DC | PRN
  Administered 2015-03-05: 1000 mL

## 2015-03-05 MED ORDER — GLIMEPIRIDE 1 MG PO TABS
1.0000 mg | ORAL_TABLET | Freq: Every day | ORAL | Status: DC
  Administered 2015-03-06 – 2015-03-07 (×2): 1 mg via ORAL
  Filled 2015-03-05 (×3): qty 1

## 2015-03-05 MED ORDER — ACETAMINOPHEN 325 MG PO TABS: 650.0000 mg | ORAL_TABLET | Freq: Four times a day (QID) | ORAL | Status: DC | PRN

## 2015-03-05 MED ORDER — PHENYLEPHRINE HCL 10 MG/ML IJ SOLN
10.0000 mg | INTRAVENOUS | Status: DC | PRN
  Administered 2015-03-05: 40 ug/min via INTRAVENOUS

## 2015-03-05 MED ORDER — DEXAMETHASONE SODIUM PHOSPHATE 10 MG/ML IJ SOLN
INTRAMUSCULAR | Status: AC
Start: 2015-03-05 — End: 2015-03-05
  Filled 2015-03-05: qty 1

## 2015-03-05 MED ORDER — LACTATED RINGERS IV SOLN
INTRAVENOUS | Status: DC
  Administered 2015-03-05: 1000 mL via INTRAVENOUS

## 2015-03-05 MED ORDER — ONDANSETRON HCL 4 MG/2ML IJ SOLN: 4.0000 mg | Freq: Four times a day (QID) | INTRAMUSCULAR | Status: DC | PRN

## 2015-03-05 MED ORDER — METHOCARBAMOL 500 MG PO TABS
500.0000 mg | ORAL_TABLET | Freq: Four times a day (QID) | ORAL | Status: DC | PRN
  Administered 2015-03-06 – 2015-03-07 (×3): 500 mg via ORAL
  Filled 2015-03-05 (×3): qty 1

## 2015-03-05 MED ORDER — ACETAMINOPHEN 650 MG RE SUPP: 650.0000 mg | Freq: Four times a day (QID) | RECTAL | Status: DC | PRN

## 2015-03-05 MED ORDER — METOCLOPRAMIDE HCL 10 MG PO TABS: 5.0000 mg | ORAL_TABLET | Freq: Three times a day (TID) | ORAL | Status: DC | PRN

## 2015-03-05 MED ORDER — LORAZEPAM 1 MG PO TABS
2.0000 mg | ORAL_TABLET | Freq: Four times a day (QID) | ORAL | Status: DC | PRN
Start: 2015-03-05 — End: 2015-03-07
  Administered 2015-03-06: 2 mg via ORAL
  Filled 2015-03-05: qty 2

## 2015-03-05 MED ORDER — MORPHINE SULFATE 2 MG/ML IJ SOLN
1.0000 mg | INTRAMUSCULAR | Status: DC | PRN
  Administered 2015-03-06 (×2): 2 mg via INTRAVENOUS
  Filled 2015-03-05 (×2): qty 1

## 2015-03-05 MED ORDER — DULOXETINE HCL 60 MG PO CPEP
60.0000 mg | ORAL_CAPSULE | Freq: Every day | ORAL | Status: DC
  Administered 2015-03-05 – 2015-03-06 (×2): 60 mg via ORAL
  Filled 2015-03-05 (×3): qty 1

## 2015-03-05 MED ORDER — CEFAZOLIN SODIUM-DEXTROSE 2-3 GM-% IV SOLR
INTRAVENOUS | Status: AC
  Filled 2015-03-05: qty 50

## 2015-03-05 MED ORDER — BUPIVACAINE IN DEXTROSE 0.75-8.25 % IT SOLN
INTRATHECAL | Status: DC | PRN
  Administered 2015-03-05: 1.6 mL via INTRATHECAL

## 2015-03-05 MED ORDER — BISACODYL 10 MG RE SUPP: 10.0000 mg | Freq: Every day | RECTAL | Status: DC | PRN

## 2015-03-05 MED ORDER — CHLORHEXIDINE GLUCONATE 4 % EX LIQD: 60.0000 mL | Freq: Once | CUTANEOUS | Status: DC

## 2015-03-05 MED ORDER — PROPOFOL INFUSION 10 MG/ML OPTIME
INTRAVENOUS | Status: DC | PRN
  Administered 2015-03-05: 100 ug/kg/min via INTRAVENOUS

## 2015-03-05 MED ORDER — SODIUM CHLORIDE 0.9 % IV SOLN: INTRAVENOUS | Status: DC

## 2015-03-05 MED ORDER — SODIUM CHLORIDE 0.9 % IV SOLN
INTRAVENOUS | Status: DC
  Administered 2015-03-05: 22:00:00 via INTRAVENOUS

## 2015-03-05 MED ORDER — OXYBUTYNIN CHLORIDE 5 MG PO TABS
2.5000 mg | ORAL_TABLET | Freq: Every day | ORAL | Status: DC
  Administered 2015-03-06 – 2015-03-07 (×2): 2.5 mg via ORAL
  Filled 2015-03-05 (×3): qty 0.5

## 2015-03-05 MED ORDER — SODIUM CHLORIDE 0.9 % IJ SOLN
INTRAMUSCULAR | Status: AC
  Filled 2015-03-05: qty 50

## 2015-03-05 MED ORDER — FENTANYL CITRATE 0.05 MG/ML IJ SOLN
INTRAMUSCULAR | Status: AC
  Filled 2015-03-05: qty 2

## 2015-03-05 MED ORDER — METFORMIN HCL 500 MG PO TABS: 1000.0000 mg | ORAL_TABLET | Freq: Every day | ORAL | Status: DC

## 2015-03-05 MED ORDER — CARVEDILOL 25 MG PO TABS
25.0000 mg | ORAL_TABLET | Freq: Two times a day (BID) | ORAL | Status: DC
  Administered 2015-03-06 – 2015-03-07 (×3): 25 mg via ORAL
  Filled 2015-03-05 (×5): qty 1

## 2015-03-05 MED ORDER — OXYCODONE HCL ER 20 MG PO T12A
20.0000 mg | EXTENDED_RELEASE_TABLET | Freq: Two times a day (BID) | ORAL | Status: DC
  Administered 2015-03-05 – 2015-03-07 (×4): 20 mg via ORAL
  Filled 2015-03-05 (×4): qty 1

## 2015-03-05 MED ORDER — DOCUSATE SODIUM 100 MG PO CAPS
100.0000 mg | ORAL_CAPSULE | Freq: Two times a day (BID) | ORAL | Status: DC
  Administered 2015-03-05 – 2015-03-07 (×4): 100 mg via ORAL

## 2015-03-05 MED ORDER — TRAMADOL HCL 50 MG PO TABS: 50.0000 mg | ORAL_TABLET | Freq: Four times a day (QID) | ORAL | Status: DC | PRN

## 2015-03-05 MED ORDER — ACETAMINOPHEN 500 MG PO TABS
1000.0000 mg | ORAL_TABLET | Freq: Four times a day (QID) | ORAL | Status: AC
  Administered 2015-03-05 – 2015-03-06 (×4): 1000 mg via ORAL
  Filled 2015-03-05 (×5): qty 2

## 2015-03-05 MED ORDER — MIDAZOLAM HCL 2 MG/2ML IJ SOLN
INTRAMUSCULAR | Status: AC
  Filled 2015-03-05: qty 2

## 2015-03-05 MED ORDER — BUPIVACAINE HCL (PF) 0.25 % IJ SOLN
INTRAMUSCULAR | Status: AC
  Filled 2015-03-05: qty 30

## 2015-03-05 MED ORDER — RIVAROXABAN 10 MG PO TABS
10.0000 mg | ORAL_TABLET | Freq: Every day | ORAL | Status: DC
  Administered 2015-03-06 – 2015-03-07 (×2): 10 mg via ORAL
  Filled 2015-03-05 (×3): qty 1

## 2015-03-05 MED ORDER — MENTHOL 3 MG MT LOZG: 1.0000 | LOZENGE | OROMUCOSAL | Status: DC | PRN

## 2015-03-05 MED ORDER — LACTATED RINGERS IV SOLN
INTRAVENOUS | Status: DC | PRN
  Administered 2015-03-05 (×2): via INTRAVENOUS

## 2015-03-05 MED ORDER — BUPIVACAINE LIPOSOME 1.3 % IJ SUSP
INTRAMUSCULAR | Status: DC | PRN
  Administered 2015-03-05: 20 mL

## 2015-03-05 MED ORDER — BUPROPION HCL ER (XL) 150 MG PO TB24
150.0000 mg | ORAL_TABLET | Freq: Two times a day (BID) | ORAL | Status: DC
  Administered 2015-03-05 – 2015-03-07 (×4): 150 mg via ORAL
  Filled 2015-03-05 (×6): qty 1

## 2015-03-05 MED ORDER — BUPIVACAINE LIPOSOME 1.3 % IJ SUSP
20.0000 mL | Freq: Once | INTRAMUSCULAR | Status: DC
Start: 2015-03-05 — End: 2015-03-05
  Filled 2015-03-05: qty 20

## 2015-03-05 MED ORDER — PROPOFOL 10 MG/ML IV BOLUS
INTRAVENOUS | Status: DC | PRN
Start: 2015-03-05 — End: 2015-03-05
  Administered 2015-03-05: 20 mg via INTRAVENOUS
  Administered 2015-03-05: 40 mg via INTRAVENOUS
  Administered 2015-03-05: 20 mg via INTRAVENOUS

## 2015-03-05 MED ORDER — METOCLOPRAMIDE HCL 5 MG/ML IJ SOLN: 5.0000 mg | Freq: Three times a day (TID) | INTRAMUSCULAR | Status: DC | PRN

## 2015-03-05 MED ORDER — CEFAZOLIN SODIUM-DEXTROSE 2-3 GM-% IV SOLR
2.0000 g | INTRAVENOUS | Status: AC
  Administered 2015-03-05: 2 g via INTRAVENOUS

## 2015-03-05 MED ORDER — ACETAMINOPHEN 10 MG/ML IV SOLN
1000.0000 mg | Freq: Once | INTRAVENOUS | Status: DC
Start: 2015-03-05 — End: 2015-03-05
  Filled 2015-03-05: qty 100

## 2015-03-05 SURGICAL SUPPLY — 84 items
ADAPTER BOLT FEMORAL +2/-2 (Knees) ×1 IMPLANT
ADPR FEM +2/-2 OFST BOLT (Knees) ×1 IMPLANT
ADPR FEM 5D STRL KN PFC SGM (Orthopedic Implant) ×1 IMPLANT
AUG FEM SZ2.5 4 CMB POST STRL (Knees) ×1 IMPLANT
AUG FEM SZ2.5 8 CMNT CMB STRL (Knees) ×1 IMPLANT
AUG FEM SZ2.5 8 STRL LF KN RT (Knees) ×1 IMPLANT
AUG TIB SZ2.5 10 REV STP WDG (Knees) ×2 IMPLANT
AUGMENT DIST PFC 8MM SZ2.5 RT (Knees) IMPLANT
BAG DECANTER FOR FLEXI CONT (MISCELLANEOUS) ×2 IMPLANT
BAG SPEC THK2 15X12 ZIP CLS (MISCELLANEOUS)
BAG ZIPLOCK 12X15 (MISCELLANEOUS) IMPLANT
BANDAGE ELASTIC 6 VELCRO ST LF (GAUZE/BANDAGES/DRESSINGS) ×2 IMPLANT
BANDAGE ESMARK 6X9 LF (GAUZE/BANDAGES/DRESSINGS) ×1 IMPLANT
BLADE SAG 18X100X1.27 (BLADE) ×2 IMPLANT
BLADE SAW SGTL 11.0X1.19X90.0M (BLADE) ×2 IMPLANT
BNDG CMPR 9X6 STRL LF SNTH (GAUZE/BANDAGES/DRESSINGS) ×1
BNDG ESMARK 6X9 LF (GAUZE/BANDAGES/DRESSINGS) ×2
BONE CEMENT GENTAMICIN (Cement) ×6 IMPLANT
CEMENT BONE GENTAMICIN 40 (Cement) ×3 IMPLANT
CEMENT RESTRICTOR DEPUY SZ 4 (Cement) ×1 IMPLANT
CUFF TOURN SGL QUICK 34 (TOURNIQUET CUFF) ×2
CUFF TRNQT CYL 34X4X40X1 (TOURNIQUET CUFF) ×1 IMPLANT
DISAL AUG PFC 8MM SZ2.5 RT (Knees) ×2 IMPLANT
DRAPE EXTREMITY T 121X128X90 (DRAPE) ×2 IMPLANT
DRAPE POUCH INSTRU U-SHP 10X18 (DRAPES) ×2 IMPLANT
DRAPE U-SHAPE 47X51 STRL (DRAPES) ×2 IMPLANT
DRSG ADAPTIC 3X8 NADH LF (GAUZE/BANDAGES/DRESSINGS) ×2 IMPLANT
DRSG PAD ABDOMINAL 8X10 ST (GAUZE/BANDAGES/DRESSINGS) ×2 IMPLANT
DURAPREP 26ML APPLICATOR (WOUND CARE) ×2 IMPLANT
ELECT REM PT RETURN 9FT ADLT (ELECTROSURGICAL) ×2
ELECTRODE REM PT RTRN 9FT ADLT (ELECTROSURGICAL) ×1 IMPLANT
EVACUATOR 1/8 PVC DRAIN (DRAIN) ×2 IMPLANT
FACESHIELD WRAPAROUND (MASK) ×10 IMPLANT
FACESHIELD WRAPAROUND OR TEAM (MASK) ×5 IMPLANT
FEM TC3 RT PFC SIGMA SZ2.5 (Orthopedic Implant) ×2 IMPLANT
FEMORAL ADAPTER (Orthopedic Implant) ×1 IMPLANT
FEMORAL TC3 RT PFC SIGMA SZ2.5 (Orthopedic Implant) IMPLANT
GAUZE SPONGE 4X4 12PLY STRL (GAUZE/BANDAGES/DRESSINGS) ×2 IMPLANT
GLOVE BIO SURGEON STRL SZ7.5 (GLOVE) IMPLANT
GLOVE BIO SURGEON STRL SZ8 (GLOVE) ×2 IMPLANT
GLOVE BIOGEL PI IND STRL 8 (GLOVE) ×1 IMPLANT
GLOVE BIOGEL PI INDICATOR 8 (GLOVE) ×1
GLOVE SURG SS PI 6.5 STRL IVOR (GLOVE) IMPLANT
GOWN STRL REUS W/TWL LRG LVL3 (GOWN DISPOSABLE) ×2 IMPLANT
GOWN STRL REUS W/TWL XL LVL3 (GOWN DISPOSABLE) IMPLANT
HANDPIECE INTERPULSE COAX TIP (DISPOSABLE) ×2
IMMOBILIZER KNEE 20 (SOFTGOODS) ×2
IMMOBILIZER KNEE 20 THIGH 36 (SOFTGOODS) ×1 IMPLANT
INSERT TC3 RP TIBIAL 2.5 20MM (Knees) ×1 IMPLANT
KIT BASIN OR (CUSTOM PROCEDURE TRAY) ×2 IMPLANT
MANIFOLD NEPTUNE II (INSTRUMENTS) ×2 IMPLANT
NDL SAFETY ECLIPSE 18X1.5 (NEEDLE) ×3 IMPLANT
NEEDLE HYPO 18GX1.5 SHARP (NEEDLE) ×6
NS IRRIG 1000ML POUR BTL (IV SOLUTION) ×2 IMPLANT
PACK TOTAL JOINT (CUSTOM PROCEDURE TRAY) ×2 IMPLANT
PADDING CAST COTTON 6X4 STRL (CAST SUPPLIES) ×3 IMPLANT
PEN SKIN MARKING BROAD (MISCELLANEOUS) ×2 IMPLANT
POSITIONER SURGICAL ARM (MISCELLANEOUS) ×2 IMPLANT
POST AUG PFC 4MM SZ 2.5 (Knees) ×1 IMPLANT
POST AUG PFC 8MM SZ 2.5 (Knees) ×1 IMPLANT
SET HNDPC FAN SPRY TIP SCT (DISPOSABLE) ×1 IMPLANT
STAPLER VISISTAT 35W (STAPLE) ×2 IMPLANT
STEM TIBIA PFC 13X30MM (Stem) ×1 IMPLANT
STEM UNIVERSAL REVISION 75X18 (Stem) ×1 IMPLANT
SUCTION FRAZIER 12FR DISP (SUCTIONS) ×2 IMPLANT
SUT MNCRL AB 4-0 PS2 18 (SUTURE) ×1 IMPLANT
SUT VIC AB 2-0 CT1 27 (SUTURE) ×6
SUT VIC AB 2-0 CT1 TAPERPNT 27 (SUTURE) ×3 IMPLANT
SUT VLOC 180 0 24IN GS25 (SUTURE) ×2 IMPLANT
SWAB COLLECTION DEVICE MRSA (MISCELLANEOUS) IMPLANT
SYR 20CC LL (SYRINGE) ×2 IMPLANT
SYR 50ML LL SCALE MARK (SYRINGE) ×4 IMPLANT
TOWEL OR 17X26 10 PK STRL BLUE (TOWEL DISPOSABLE) ×2 IMPLANT
TOWEL OR NON WOVEN STRL DISP B (DISPOSABLE) IMPLANT
TOWER CARTRIDGE SMART MIX (DISPOSABLE) ×2 IMPLANT
TRAY FOLEY CATH 14FRSI W/METER (CATHETERS) ×2 IMPLANT
TRAY REVISION SZ 2.5 (Knees) ×1 IMPLANT
TRAY SLEEVE CEM ML (Knees) ×1 IMPLANT
TUBE ANAEROBIC SPECIMEN COL (MISCELLANEOUS) IMPLANT
TUBE KAMVAC SUCTION (TUBING) IMPLANT
WATER STERILE IRR 1500ML POUR (IV SOLUTION) ×2 IMPLANT
WEDGE STEP 2.5 10MM (Knees) ×2 IMPLANT
WRAP KNEE MAXI GEL POST OP (GAUZE/BANDAGES/DRESSINGS) ×1 IMPLANT
YANKAUER SUCT BULB TIP 10FT TU (MISCELLANEOUS) ×2 IMPLANT

## 2015-03-05 NOTE — Anesthesia Postprocedure Evaluation (Signed)
  Anesthesia Post-op Note  Patient: EMORIE KNIPFER  Procedure(s) Performed: Procedure(s): RIGHT TOTAL KNEE ARTHROPLASTY REVISION (Right)  Patient Location: PACU  Anesthesia Type:Spinal  Level of Consciousness: awake, alert  and oriented  Airway and Oxygen Therapy: Patient Spontanous Breathing and Patient connected to nasal cannula oxygen  Post-op Pain: none  Post-op Assessment: Post-op Vital signs reviewed, Patient's Cardiovascular Status Stable, Respiratory Function Stable, Patent Airway, No signs of Nausea or vomiting, Pain level controlled, No headache, No backache and No residual motor weakness  Post-op Vital Signs: Reviewed and stable  Last Vitals:  Filed Vitals:   03/05/15 1730  BP: 139/95  Pulse: 72  Temp: 36.9 C  Resp: 17    Complications: No apparent anesthesia complications

## 2015-03-05 NOTE — Anesthesia Preprocedure Evaluation (Addendum)
Anesthesia Evaluation  Patient identified by MRN, date of birth, ID band Patient awake    Reviewed: Allergy & Precautions, NPO status , Patient's Chart, lab work & pertinent test results, reviewed documented beta blocker date and time   Airway Mallampati: III  TM Distance: >3 FB Neck ROM: Full    Dental no notable dental hx. (+) Teeth Intact   Pulmonary pneumonia -, resolved, former smoker,  breath sounds clear to auscultation  Pulmonary exam normal       Cardiovascular hypertension, Pt. on medications and Pt. on home beta blockers + Peripheral Vascular Disease negative cardio ROS  Rhythm:Regular Rate:Normal     Neuro/Psych  Headaches, PSYCHIATRIC DISORDERS Anxiety Depression    GI/Hepatic Neg liver ROS, GERD-  Medicated and Controlled,IBS Hx/o colitis   Endo/Other  diabetes, Well Controlled, Type 2, Oral Hypoglycemic Agents  Renal/GU Renal InsufficiencyRenal disease  negative genitourinary   Musculoskeletal  (+) Arthritis -,   Abdominal (+) + obese,   Peds  Hematology  (+) anemia ,   Anesthesia Other Findings   Reproductive/Obstetrics negative OB ROS                            Anesthesia Physical Anesthesia Plan  ASA: III  Anesthesia Plan: Spinal   Post-op Pain Management:    Induction:   Airway Management Planned: Natural Airway  Additional Equipment:   Intra-op Plan:   Post-operative Plan:   Informed Consent: I have reviewed the patients History and Physical, chart, labs and discussed the procedure including the risks, benefits and alternatives for the proposed anesthesia with the patient or authorized representative who has indicated his/her understanding and acceptance.     Plan Discussed with: Anesthesiologist, CRNA and Surgeon  Anesthesia Plan Comments:         Anesthesia Quick Evaluation

## 2015-03-05 NOTE — Plan of Care (Signed)
Problem: Consults Goal: Diagnosis- Total Joint Replacement Revision Total Knee     

## 2015-03-05 NOTE — Brief Op Note (Signed)
03/05/2015  4:14 PM  PATIENT:  [name]  64 y.o. female  PRE-OPERATIVE DIAGNOSIS:  loose right total knee arthroplasty  POST-OPERATIVE DIAGNOSIS:  loose right total knee arthroplasty  PROCEDURE:  Procedure(s): RIGHT TOTAL KNEE ARTHROPLASTY REVISION (Right)  SURGEON:  Surgeon(s) and Role:    Gaynelle Arabian, MD - Primary  PHYSICIAN ASSISTANT:   ASSISTANTS: Arlee Muslim, PA-C   ANESTHESIA:   spinal  EBL:  Total I/O In: 1000 [I.V.:1000] Out: 325 [Urine:100; Blood:225]  BLOOD ADMINISTERED:None  DRAINS: (Medium) Hemovact drain(s) in the right knee with  Suction Open   LOCAL MEDICATIONS USED:  OTHER Exparel  COUNTS:  YES  TOURNIQUET:   Total Tourniquet Time Documented: Thigh (Right) - 37 minutes Thigh (Right) - 18 minutes Total: Thigh (Right) - 55 minutes   DICTATION: .Other Dictation: Dictation Number [id number]  PLAN OF CARE: Admit to inpatient   PATIENT DISPOSITION:  PACU - hemodynamically stable.

## 2015-03-05 NOTE — Interval H&P Note (Signed)
History and Physical Interval Note:  03/05/2015 2:29 PM  [name]  has presented today for surgery, with the diagnosis of loose right total knee arthroplasty  The various methods of treatment have been discussed with the patient and family. After consideration of risks, benefits and other options for treatment, the patient has consented to  Procedure(s): RIGHT TOTAL KNEE ARTHROPLASTY REVISION (Right) as a surgical intervention .  The patient's history has been reviewed, patient examined, no change in status, stable for surgery.  I have reviewed the patient's chart and labs.  Questions were answered to the patient's satisfaction.     Gearlean Alf

## 2015-03-05 NOTE — Transfer of Care (Signed)
Immediate Anesthesia Transfer of Care Note  Patient: [name]  Procedure(s) Performed: Procedure(s): RIGHT TOTAL KNEE ARTHROPLASTY REVISION (Right)  Patient Location: PACU  Anesthesia Type:Spinal  Level of Consciousness: awake, alert  and oriented  Airway & Oxygen Therapy: Patient Spontanous Breathing and Patient connected to nasal cannula oxygen  Post-op Assessment: Report given to RN and Post -op Vital signs reviewed and stable  Post vital signs: Reviewed and stable  Last Vitals:  Filed Vitals:   03/05/15 1247  BP: 170/84  Pulse: 81  Temp: 36.9 C  Resp: 18    Complications: No apparent anesthesia complications

## 2015-03-05 NOTE — Anesthesia Procedure Notes (Signed)
Spinal Patient location during procedure: OR Start time: 03/05/2015 2:48 PM Staffing Anesthesiologist: Josephine Igo Performed by: anesthesiologist  Preanesthetic Checklist Completed: patient identified, site marked, surgical consent, pre-op evaluation, timeout performed, IV checked, risks and benefits discussed and monitors and equipment checked Spinal Block Patient position: sitting Prep: site prepped and draped and DuraPrep Patient monitoring: heart rate, cardiac monitor, continuous pulse ox and blood pressure Approach: midline Location: L3-4 Injection technique: single-shot Needle Needle type: Sprotte  Needle gauge: 24 G Needle length: 9 cm Needle insertion depth: 7 cm Assessment Sensory level: T6 Additional Notes Patient tolerated procedure well. Adequate sensory level.

## 2015-03-05 NOTE — Progress Notes (Signed)
PHARMACIST - PHYSICIAN COMMUNICATION DR:  Wynelle Link CONCERNING:  METFORMIN SAFE ADMINISTRATION POLICY  RECOMMENDATION: Metformin has been placed on DISCONTINUE (rejected order) STATUS and should be reordered only after any of the conditions below are ruled out.  Current safety recommendations include avoiding metformin for a minimum of 48 hours after the patient's exposure to intravenous contrast media.  DESCRIPTION:  The Pharmacy Committee has adopted a policy that restricts the use of metformin in hospitalized patients until all the contraindications to administration have been ruled out. Specific contraindications are: []  Serum creatinine ? 1.5 for males [x]  Serum creatinine ? 1.4 for females []  Shock, acute MI, sepsis, hypoxemia, dehydration []  Planned administration of intravenous iodinated contrast media []  Heart Failure patients with low EF []  Acute or chronic metabolic acidosis (including DKA)     Most recent SCr = 1.4 on 02/26/15.  MD may resume if/when SCr < 1.4. BMET pending in AM.  Hershal Coria, PharmD, BCPS Pager: [phone] 03/05/2015 7:01 PM

## 2015-03-06 ENCOUNTER — Encounter (HOSPITAL_COMMUNITY): Payer: Self-pay | Admitting: Orthopedic Surgery

## 2015-03-06 LAB — CBC
HCT: 28.4 % — ABNORMAL LOW (ref 36.0–46.0)
Hemoglobin: 9.4 g/dL — ABNORMAL LOW (ref 12.0–15.0)
MCH: 30.1 pg (ref 26.0–34.0)
MCHC: 33.1 g/dL (ref 30.0–36.0)
MCV: 91 fL (ref 78.0–100.0)
Platelets: 241 10*3/uL (ref 150–400)
RBC: 3.12 MIL/uL — ABNORMAL LOW (ref 3.87–5.11)
RDW: 13.1 % (ref 11.5–15.5)
WBC: 14.4 10*3/uL — ABNORMAL HIGH (ref 4.0–10.5)

## 2015-03-06 LAB — BASIC METABOLIC PANEL
Anion gap: 12 (ref 5–15)
BUN: 23 mg/dL (ref 6–23)
CO2: 27 mmol/L (ref 19–32)
Calcium: 8.8 mg/dL (ref 8.4–10.5)
Chloride: 98 mmol/L (ref 96–112)
Creatinine, Ser: 1.07 mg/dL (ref 0.50–1.10)
GFR calc Af Amer: 63 mL/min — ABNORMAL LOW (ref >90)
GFR calc non Af Amer: 54 mL/min — ABNORMAL LOW (ref >90)
Glucose, Bld: 229 mg/dL — ABNORMAL HIGH (ref 70–99)
Potassium: 4.2 mmol/L (ref 3.5–5.1)
Sodium: 137 mmol/L (ref 135–145)

## 2015-03-06 LAB — GLUCOSE, CAPILLARY
Glucose-Capillary: 165 mg/dL — ABNORMAL HIGH (ref 70–99)
Glucose-Capillary: 187 mg/dL — ABNORMAL HIGH (ref 70–99)
Glucose-Capillary: 218 mg/dL — ABNORMAL HIGH (ref 70–99)
Glucose-Capillary: 242 mg/dL — ABNORMAL HIGH (ref 70–99)

## 2015-03-06 MED ORDER — METFORMIN HCL 500 MG PO TABS
1000.0000 mg | ORAL_TABLET | Freq: Every day | ORAL | Status: DC
  Administered 2015-03-07: 1000 mg via ORAL
  Filled 2015-03-06 (×2): qty 2

## 2015-03-06 MED ORDER — NON FORMULARY: 20.0000 mg | Freq: Every day | Status: DC

## 2015-03-06 MED ORDER — OMEPRAZOLE 20 MG PO CPDR
20.0000 mg | DELAYED_RELEASE_CAPSULE | Freq: Every day | ORAL | Status: DC
  Administered 2015-03-06 – 2015-03-07 (×2): 20 mg via ORAL
  Filled 2015-03-06 (×3): qty 1

## 2015-03-06 NOTE — Op Note (Signed)
[name], [name]                 ACCOUNT NO.:  [id number]  MEDICAL RECORD NO.:  [id number]  LOCATION:  [id number]                         FACILITY:  [facility]  PHYSICIAN:  Gaynelle Arabian, M.D.    DATE OF BIRTH:  11/27/1951  DATE OF PROCEDURE:  03/05/2015 DATE OF DISCHARGE:                              OPERATIVE REPORT   PREOPERATIVE DIAGNOSIS:  Aseptic loosening, right total knee arthroplasty.  POSTOPERATIVE DIAGNOSIS:  Aseptic loosening, right total knee arthroplasty.  PROCEDURE:  Right total knee arthroplasty revision.  SURGEON:  Gaynelle Arabian, M.D.  ASSISTANT:  Alexzandrew L. Perkins, P.A.C.  ANESTHESIA:  Spinal.  ESTIMATED BLOOD LOSS:  Minimal.  DRAINS:  Hemovac x1.  TOURNIQUET TIME:  Up 37 minutes at 300 mmHg, down 8 minutes, up additional 18 minutes at 300 mmHg.  COMPLICATIONS:  None.  CONDITION:  Stable to recovery.  BRIEF CLINICAL NOTE:  [name] is a 64 year old female, had bilateral total knee arthroplasties done several years ago.  She had done well until the past couple of years when she started having increased pain in her right knee.  Radiographs were consistent with probable loosening of the femoral component as it looked like it had tilted into valgus.  Given her pain and change in radiographic status, it was felt that she has a loose total knee and presents now for revision total knee arthroplasty.  PROCEDURE IN DETAIL:  After successful administration of spinal anesthetic, a tourniquet was placed high on her right thigh and right lower extremity was prepped and draped in the usual sterile fashion. Extremity was wrapped in Esmarch and tourniquet inflated to 300 mmHg.  A midline incision was made with a 10-blade through subcutaneous tissue to the level of the extensor mechanism.  Fresh blade was used to make a medial parapatellar arthrotomy.  Soft tissue on the proximal medial tibia was subperiosteally elevated to the joint line with a knife and into the  semimembranosus bursa with a Cobb elevator.  There was a tremendous amount of osteolytic debris in the joint and I thoroughly debrided this.  The patella was then subluxed laterally and the knee flexed 90 degrees.  I removed the tibial polyethylene.  Retractors were then placed circumferentially around the tibia and the tibia subluxed forward.  Oscillating saw was used to disrupt the interface between the tibial component and bone and then the component was easily removed.  It was not grossly loose, but did come out relatively easily with disrupting interface of the saw.  I then removed the cement from the tibial canal.  The extramedullary tibial alignment guide was placed and referencing proximally at the medial aspect of the tibial tubercle and distally along the second metatarsal aspect of the tibial crest.  The block was pinned to remove 2 mm off the current tibial surface.  The resection was made with an oscillating saw.  Size 2.5 was the most appropriate tibial component.  MBT revision 2.5 was placed and then, we drilled with the proximal drill and then the proximal drill plus the 13 x 30 stem.  We then prepared for 29-mm sleeve.  We then addressed the femur.  I was able to remove the femoral component  with my fingers.  It was grossly loose.  There was a tremendous amount of osteolytic debris around the femur, this was all removed.  All remaining cement was then removed.  We gained access to the femoral canal and it was thoroughly irrigated with saline.  We then reamed up to 18 mm, which had an excellent press-fit.  Reamer was left in place and serve as our intramedullary cutting guide.  The 5-degree right valgus alignment guide was placed as distal femoral cut.  We pinned this to remove 3 mm off the medial side.  There was a large lateral deficiency due to the significant valgus.  The resection was made with an oscillating saw.  We had to go up 8 mm on the lateral side to get  the bowing surface, thus need an 8-mm augment.  The distal femoral cutting block was then placed with 8-degree augment laterally and no augment medially.  The size 2.5 was most appropriate femoral size.  The size 2.4 cutting block was placed in the +2 position effectively, raising the stem and lowering the flange of the femur to the anterior cortex. Rotation was marked at the epicondylar axis and confirmed by creating a rectangular flexion gap at 90 degrees with a 10-mm insert spacer.  The block was pinned in this position.  The anterior and chamfer cuts were made.  Posteriorly, we needed 8-mm augment posterolateral and a 4-mm augment posteromedial.  The intercondylar box cut was then placed and intercondylar cut made for a TC3 femur.  We then placed the trials on the tibial side, it was a size 2.5 MBT revision tray with 10-mm augments medial and lateral, a 29-mm sleeve and a 13 x 30 stem extension.  This was impacted into the tibial canal.  Then, the keel punch was placed. On the femoral side, the trial was a 2.5 TC3 femur with an 18 x 75 stem extension in a +2 position and 5 degrees of valgus.  There was a distal augment 8 mm on the lateral side, then a posterior augment 8 mm on the lateral side and posterior augment 4 mm on the medial side.  The trials were placed with excellent fit and purchase.  I placed a 17.5 trial insert, it was tiny bit of hyperextension with this, so, we went to 20, which allowed for full extension with excellent varus-valgus and anterior-posterior balance throughout full range of motion.  Patella was everted and all of the osteolytic debris taken off the underside of the extensor mechanism.  We did a patelloplasty, removed all the tissue and the patellar components were well fixed and well positioned.  It tracked normally and we flexed the knee.  We then released the tourniquet and kept it down for 8 minutes.  Minor bleeding was stopped with cautery. While the  tourniquet was down, the components were assembled on the back table.  After 8 minutes, we rewrapped the leg in Esmarch and reinflated the tourniquet to 300 mmHg.  I then removed the trial component and sized for a size 4 tibial cement restrictor.  It was placed at the appropriate depth in the tibial canal.  We then thoroughly irrigated the bone with pulsatile lavage and the cement was mixed.  This was three batches of gentamicin impregnated cement.  The tibial component was first cemented.  The cement injected into the tibial canal and then placed on the cut tibial surface.  The tibial component, which was a size 2.5 MBT revision tray, 10-mm medial  and lateral augments, 13 x 30 stem extension and 29-mm sleeve.  It impacted into the tibia and all extruded cement removed.  The femoral component was then cemented distally and the stem was press fit.  Again, the femoral component was 2.5 TC3 femur with an 8-mm distal lateral augment, 8-mm posterolateral augment, 4-mm posteromedial augment and then an 18 x 75 stem with a +2 5- degree valgus position.  Tibia, femoral components impacted, all extruded cement removed.  A 20 mL trial insert was placed, knee held in full extension.  All extruded cement removed.  Once cement fully hardened, then, the permanent 20-mm size 2.5 TC3 rotating platform insert was placed into the tibial tray.  The knee was reduced with the same stability parameters.  The patella was tracked normally.  Wound was copiously irrigated with saline solution.  Hemovac drain was placed into the joint and the arthrotomy closed with a running #1 V-Loc suture. Flexion against gravity was 130 degrees and the patella tracked normally.  We then released the tourniquet for a second tourniquet time of 18 minutes.  Subcutaneous bleeding was stopped.  Subcutaneous drain was placed.  Subcu tissue was closed with interrupted 2-0 Vicryl and skin closed with staples.  Prior to closure, we had  injected the extensor mechanism and periosteum of the femur with 20 mL of Exparel mixed with 40 mL of saline and then additional 20 mL of 0.25% Marcaine with epi.  The incision was cleaned and dried, and the drain was hooked to suction.  A bulky sterile dressing was then applied.  She was placed into a knee immobilizer, awakened and transferred to the recovery room in stable condition.  Note that a surgical assistant was a medical necessity for this procedure to do it in a safe and expeditious manner.  Surgical assistant was necessary for proper retraction of the vital neurovascular structures and appropriate positioning of the limb, removal of the old prosthesis and then preparation and placement of the new prosthesis.     Gaynelle Arabian, M.D.     FA/MEDQ  D:  03/05/2015  T:  03/06/2015  Job:  [id number]

## 2015-03-06 NOTE — Progress Notes (Signed)
   Subjective: 1 Day Post-Op Procedure(s) (LRB): RIGHT TOTAL KNEE ARTHROPLASTY REVISION (Right) Patient reports pain as mild and moderate.  Tough night. Patient seen in rounds with Dr. Wynelle Link. Patient is well, but has had some minor complaints of pain in the knee, requiring pain medications We will start therapy today.  Plan is to go Home after hospital stay.  Objective: Vital signs in last 24 hours: Temp:  [97.5 F (36.4 C)-98.7 F (37.1 C)] 98 F (36.7 C) (03/24 0502) Pulse Rate:  [62-86] 69 (03/24 0757) Resp:  [11-19] 16 (03/24 0502) BP: (120-170)/(67-95) 155/76 mmHg (03/24 0757) SpO2:  [94 %-100 %] 97 % (03/24 0502) Weight:  [111.185 kg (245 lb 1.9 oz)] 111.185 kg (245 lb 1.9 oz) (03/23 1313)  Intake/Output from previous day:  Intake/Output Summary (Last 24 hours) at 03/06/15 0828 Last data filed at 03/06/15 0634  Gross per 24 hour  Intake 4232.08 ml  Output   3415 ml  Net 817.08 ml     Labs:  Recent Labs  03/06/15 0505  HGB 9.4*    Recent Labs  03/06/15 0505  WBC 14.4*  RBC 3.12*  HCT 28.4*  PLT 241    Recent Labs  03/06/15 0505  NA 137  K 4.2  CL 98  CO2 27  BUN 23  CREATININE 1.07  GLUCOSE 229*  CALCIUM 8.8   No results for input(s): LABPT, INR in the last 72 hours.  EXAM General - Patient is Alert, Appropriate and Oriented Extremity - Neurovascular intact Sensation intact distally Dorsiflexion/Plantar flexion intact Dressing - dressing C/D/I Motor Function - intact, moving foot and toes well on exam.  Hemovac pulled without difficulty.  Past Medical History  Diagnosis Date  . Diabetes mellitus     type II  . GERD (gastroesophageal reflux disease)   . Hypertension   . IBS (irritable bowel syndrome)   . Depression   . Anxiety   . LBP (low back pain)   . Osteoarthritis   . Colitis 2010    microscopic- Dr Henrene Pastor  . Renal insufficiency 2011  . Osteopenia   . Anemia   . Pneumonia     hx of 2014   . Headache   . Sinusitis     currently being treated with antibiotic will complete 03/04/2015  . Neuropathy     feet bilat     Assessment/Plan: 1 Day Post-Op Procedure(s) (LRB): RIGHT TOTAL KNEE ARTHROPLASTY REVISION (Right) Principal Problem:   Failed total right knee replacement Active Problems:   OA (osteoarthritis) of knee  Estimated body mass index is 43.43 kg/(m^2) as calculated from the following:   Height as of this encounter: 5\' 3"  (1.6 m).   Weight as of this encounter: 111.185 kg (245 lb 1.9 oz). Advance diet Up with therapy Plan for discharge tomorrow Discharge home with home health  DVT Prophylaxis - Xarelto Weight-Bearing as tolerated to right leg D/C O2 and Pulse OX and try on Room Air  Arlee Muslim, PA-C Orthopaedic Surgery 03/06/2015, 8:28 AM

## 2015-03-06 NOTE — Discharge Instructions (Addendum)
° °Dr. Frank Aluisio °Total Joint Specialist °[facility] °[address] °[place], [place] [address] °[phone] ° °TOTAL KNEE REPLACEMENT POSTOPERATIVE DIRECTIONS ° ° ° °Knee Rehabilitation, Guidelines Following Surgery  °Results after knee surgery are often greatly improved when you follow the exercise, range of motion and muscle strengthening exercises prescribed by your doctor. Safety measures are also important to protect the knee from further injury. Any time any of these exercises cause you to have increased pain or swelling in your knee joint, decrease the amount until you are comfortable again and slowly increase them. If you have problems or questions, call your caregiver or physical therapist for advice.  ° °HOME CARE INSTRUCTIONS  °Remove items at home which could result in a fall. This includes throw rugs or furniture in walking pathways.  °Continue medications as instructed at time of discharge. °You may have some home medications which will be placed on hold until you complete the course of blood thinner medication.  °You may start showering once you are discharged home but do not submerge the incision under water. Just pat the incision dry and apply a dry gauze dressing on daily. °Walk with walker as instructed.  °You may resume a sexual relationship in one month or when given the OK by  your doctor.  °· Use walker as long as suggested by your caregivers. °· Avoid periods of inactivity such as sitting longer than an hour when not asleep. This helps prevent blood clots.  °You may put full weight on your legs and walk as much as is comfortable.  °You may return to work once you are cleared by your doctor.  °Do not drive a car for 6 weeks or until released by you surgeon.  °· Do not drive while taking narcotics.  °Wear the elastic stockings for three weeks following surgery during the day but you may remove then at night. °Make sure you keep all of your appointments after your  operation with all of your doctors and caregivers. You should call the office at the above phone number and make an appointment for approximately two weeks after the date of your surgery. °Change the dressing daily and reapply a dry dressing each time. °Please pick up a stool softener and laxative for home use as long as you are requiring pain medications. °· ICE to the affected knee every three hours for 30 minutes at a time and then as needed for pain and swelling.  Continue to use ice on the knee for pain and swelling from surgery. You may notice swelling that will progress down to the foot and ankle.  This is normal after surgery.  Elevate the leg when you are not up walking on it.   °It is important for you to complete the blood thinner medication as prescribed by your doctor. °· Continue to use the breathing machine which will help keep your temperature down.  It is common for your temperature to cycle up and down following surgery, especially at night when you are not up moving around and exerting yourself.  The breathing machine keeps your lungs expanded and your temperature down. ° °RANGE OF MOTION AND STRENGTHENING EXERCISES  °Rehabilitation of the knee is important following a knee injury or an operation. After just a few days of immobilization, the muscles of the thigh which control the knee become weakened and shrink (atrophy). Knee exercises are designed to build up the tone and strength of the thigh muscles and to improve knee   motion. Often times heat used for twenty to thirty minutes before working out will loosen up your tissues and help with improving the range of motion but do not use heat for the first two weeks following surgery. These exercises can be done on a training (exercise) mat, on the floor, on a table or on a bed. Use what ever works the best and is most comfortable for you Knee exercises include:  °Leg Lifts - While your knee is still immobilized in a splint or cast, you can do  straight leg raises. Lift the leg to 60 degrees, hold for 3 sec, and slowly lower the leg. Repeat 10-20 times 2-3 times daily. Perform this exercise against resistance later as your knee gets better.  °Quad and Hamstring Sets - Tighten up the muscle on the front of the thigh (Quad) and hold for 5-10 sec. Repeat this 10-20 times hourly. Hamstring sets are done by pushing the foot backward against an object and holding for 5-10 sec. Repeat as with quad sets.  °A rehabilitation program following serious knee injuries can speed recovery and prevent re-injury in the future due to weakened muscles. Contact your doctor or a physical therapist for more information on knee rehabilitation.  ° °SKILLED REHAB INSTRUCTIONS: °If the patient is transferred to a skilled rehab facility following release from the hospital, a list of the current medications will be sent to the facility for the patient to continue.  When discharged from the skilled rehab facility, please have the facility set up the patient's Home Health Physical Therapy prior to being released. Also, the skilled facility will be responsible for providing the patient with their medications at time of release from the facility to include their pain medication, the muscle relaxants, and their blood thinner medication. If the patient is still at the rehab facility at time of the two week follow up appointment, the skilled rehab facility will also need to assist the patient in arranging follow up appointment in our office and any transportation needs. ° °MAKE SURE YOU:  °Understand these instructions.  °Will watch your condition.  °Will get help right away if you are not doing well or get worse.  ° ° °Pick up stool softner and laxative for home use following surgery while on pain medications. °Do not submerge incision under water. °Please use good hand washing techniques while changing dressing each day. °May shower starting three days after surgery. °Please use a clean  towel to pat the incision dry following showers. °Continue to use ice for pain and swelling after surgery. °Do not use any lotions or creams on the incision until instructed by your surgeon. ° °Take Xarelto for two and a half more weeks, then discontinue Xarelto. °Once the patient has completed the blood thinner regimen, then take a Baby 81 mg Aspirin daily for three more weeks. ° °Postoperative Constipation Protocol ° °Constipation - defined medically as fewer than three stools per week and severe constipation as less than one stool per week. ° °One of the most common issues patients have following surgery is constipation.  Even if you have a regular bowel pattern at home, your normal regimen is likely to be disrupted due to multiple reasons following surgery.  Combination of anesthesia, postoperative narcotics, change in appetite and fluid intake all can affect your bowels.  In order to avoid complications following surgery, here are some recommendations in order to help you during your recovery period. ° °Colace (docusate) - Pick up an over-the-counter form of   Colace or another stool softener and take twice a day as long as you are requiring postoperative pain medications.  Take with a full glass of water daily.  If you experience loose stools or diarrhea, hold the colace until you stool forms back up.  If your symptoms do not get better within 1 week or if they get worse, check with your doctor.  Dulcolax (bisacodyl) - Pick up over-the-counter and take as directed by the product packaging as needed to assist with the movement of your bowels.  Take with a full glass of water.  Use this product as needed if not relieved by Colace only.   MiraLax (polyethylene glycol) - Pick up over-the-counter to have on hand.  MiraLax is a solution that will increase the amount of water in your bowels to assist with bowel movements.  Take as directed and can mix with a glass of water, juice, soda, coffee, or tea.  Take if you  go more than two days without a movement. Do not use MiraLax more than once per day. Call your doctor if you are still constipated or irregular after using this medication for 7 days in a row.  If you continue to have problems with postoperative constipation, please contact the office for further assistance and recommendations.  If you experience "the worst abdominal pain ever" or develop nausea or vomiting, please contact the office immediatly for further recommendations for treatment.     Information on my medicine - XARELTO (Rivaroxaban)  This medication education was reviewed with me or my healthcare representative as part of my discharge preparation.  The pharmacist that spoke with me during my hospital stay was:  Absher, Julieta Bellini, RPH  Why was Xarelto prescribed for you? Xarelto was prescribed for you to reduce the risk of blood clots forming after orthopedic surgery. The medical term for these abnormal blood clots is venous thromboembolism (VTE).  What do you need to know about xarelto ? Take your Xarelto ONCE DAILY at the same time every day. You may take it either with or without food.  If you have difficulty swallowing the tablet whole, you may crush it and mix in applesauce just prior to taking your dose.  Take Xarelto exactly as prescribed by your doctor and DO NOT stop taking Xarelto without talking to the doctor who prescribed the medication.  Stopping without other VTE prevention medication to take the place of Xarelto may increase your risk of developing a clot.  After discharge, you should have regular check-up appointments with your healthcare provider that is prescribing your Xarelto.    What do you do if you miss a dose? If you miss a dose, take it as soon as you remember on the same day then continue your regularly scheduled once daily regimen the next day. Do not take two doses of Xarelto on the same day.   Important Safety Information A possible side  effect of Xarelto is bleeding. You should call your healthcare provider right away if you experience any of the following: ? Bleeding from an injury or your nose that does not stop. ? Unusual colored urine (red or dark brown) or unusual colored stools (red or black). ? Unusual bruising for unknown reasons. ? A serious fall or if you hit your head (even if there is no bleeding).  Some medicines may interact with Xarelto and might increase your risk of bleeding while on Xarelto. To help avoid this, consult your healthcare provider or pharmacist prior to using  any new prescription or non-prescription medications, including herbals, vitamins, non-steroidal anti-inflammatory drugs (NSAIDs) and supplements.  This website has more information on Xarelto: [email or web address]

## 2015-03-06 NOTE — Evaluation (Signed)
Occupational Therapy Evaluation Patient Details Name: [name] MRN: [id number] DOB: 08/06/1951 Today's Date: 03/06/2015    History of Present Illness Pt is a 64 y/o female s/p Right TKA revision   Clinical Impression   Pt is a 64 y/o female dx as above whom currently presents w/ deficits in her ability to perform ADL's and functional mobility/transfers related to ADL's. She will benefit from acute OT to asist w/ pt ed & ADL retraining to maximize independence (see OT problem list below) prior to d/c home. Has LH reacher at home, will follow acutely.    Follow Up Recommendations  No OT follow up;Supervision - Intermittent    Equipment Recommendations  3 in 1 bedside comode wide/bariatric   Recommendations for Other Services       Precautions / Restrictions Precautions Precautions: Fall Restrictions Weight Bearing Restrictions: Yes RLE Weight Bearing: Weight bearing as tolerated      Mobility Bed Mobility Overal bed mobility: Needs Assistance Bed Mobility: Sidelying to Sit;Supine to Sit   Sidelying to sit: Min guard Supine to sit: Min assist     General bed mobility comments: Min A RLE, pt used rails on bed when sitting  Transfers Overall transfer level: Needs assistance Equipment used: Rolling walker (2 wheeled) Transfers: Sit to/from [facility] Sit to Stand: Min assist Stand pivot transfers: Min guard       General transfer comment: Verbal cues for safety and sequencing w/ RW during transfers.    Balance Overall balance assessment: No apparent balance deficits (not formally assessed)                                          ADL Overall ADL's : Needs assistance/impaired Eating/Feeding: Independent;Sitting   Grooming: Wash/dry hands;Standing;Min guard   Upper Body Bathing: Set up;Sitting;Modified independent (Simulated)   Lower Body Bathing: Minimal assistance;Sit to/from stand   Upper Body Dressing : Set  up;Modified independent   Lower Body Dressing: Minimal assistance;Sit to/from stand Lower Body Dressing Details (indicate cue type and reason): Min A donning socks Toilet Transfer: Min guard;Comfort height toilet;Ambulation;RW Toilet Transfer Details (indicate cue type and reason): Used grab bar when sitting and RW w/ sit to stand (Has vanity at home per her report that she can stabilize herself with). Toileting- Clothing Manipulation and Hygiene: Minimal assistance;Sit to/from stand Toileting - Clothing Manipulation Details (indicate cue type and reason): Min A peri-care after urinating and BM this morning   Tub/Shower Transfer Details (indicate cue type and reason):  (TBA) Functional mobility during ADLs: Min guard;Rolling walker General ADL Comments: Pt participated in ADL retraining session today for toileting, functional mobility in room and transfers (toilet and chair) using RW and KI RLE. Pt is overall Min guard assist during functional mobility and Min A LB ADL's. She will have assistance at d/c from a friend x1 week then her niece whom lives with her intermittently during the day after that. She will benefit from acute OT to asist w/ pt ed and ADL retraining to maximize independence prior to d/c home. Has LH reacher at home, may benefit from bariatric 3:1     Vision  NT; Pt wears glasses, no apparent deficits or changes from baseline.   Perception     Praxis      Pertinent Vitals/Pain Pain Assessment: 0-10 Pain Score: 6  Pain Descriptors / Indicators: Aching Pain Intervention(s): Limited activity within  patient's tolerance;Monitored during session;Premedicated before session;Repositioned;Ice applied     Hand Dominance Right   Extremity/Trunk Assessment Upper Extremity Assessment Upper Extremity Assessment: Overall WFL for tasks assessed   Lower Extremity Assessment Lower Extremity Assessment: Defer to PT evaluation       Communication Communication Communication: No  difficulties   Cognition Arousal/Alertness: Awake/alert Behavior During Therapy: WFL for tasks assessed/performed Overall Cognitive Status: Within Functional Limits for tasks assessed                     General Comments       Exercises       Shoulder Instructions      Home Living Family/patient expects to be discharged to:: Private residence Living Arrangements: Non-relatives/Friends;Other relatives (Friend (for 1+ week) and niece (in and out)) Available Help at Discharge: Family;Friend(s);Available 24 hours/day;Available PRN/intermittently (Friend for 1 week 24/7, niece intermittently - lives w/ pt) Type of Home: Apartment Home Access: Elevator;Other (comment) (Condo on one floor; takes elevator to condo)     Home Layout: One level     Bathroom Shower/Tub: Walk-in shower;Door   [employer] Toilet: Standard Bathroom Accessibility: Yes How Accessible: Accessible via walker Home Equipment: Walker - 2 wheels;Adaptive equipment Adaptive Equipment: Reacher        Prior Functioning/Environment Level of Independence: Independent             OT Diagnosis: Generalized weakness;Acute pain   OT Problem List: Decreased activity tolerance;Decreased knowledge of use of DME or AE;Pain;Obesity   OT Treatment/Interventions: Self-care/ADL training;DME and/or AE instruction;Patient/family education;Therapeutic activities    OT Goals(Current goals can be found in the care plan section) Acute Rehab OT Goals Patient Stated Goal: "Get home" Time For Goal Achievement: 03/20/15 Potential to Achieve Goals: Good  OT Frequency: Min 2X/week   Barriers to D/C:            Co-evaluation              End of Session Equipment Utilized During Treatment: Rolling walker;Right knee immobilizer Nurse Communication: Mobility status;Other (comment) (Pt urinated and had BM this morning during OT )  Activity Tolerance: Patient tolerated treatment well Patient left: in chair;with  call bell/phone within reach;with nursing/sitter in room   Time: [id number] OT Time Calculation (min): 35 min Charges:  OT General Charges $OT Visit: 1 Procedure OT Evaluation $Initial OT Evaluation Tier I: 1 Procedure OT Treatments $Self Care/Home Management : 8-22 mins G-Codes:    Almyra Deforest, OTR/L 03/06/2015, 8:51 AM

## 2015-03-06 NOTE — Progress Notes (Signed)
Utilization review completed.  

## 2015-03-06 NOTE — Progress Notes (Signed)
Physical Therapy Treatment Patient Details Name: [name] MRN: [id number] DOB: 04-Nov-1951 Today's Date: 03/06/2015    History of Present Illness Pt is a 64 y/o female s/p Right TKA revision    PT Comments    Pt progressing well with mobility and hopeful for dc tomorrow  Follow Up Recommendations  Home health PT     Equipment Recommendations  None recommended by PT    Recommendations for Other Services OT consult     Precautions / Restrictions Precautions Precautions: Fall Restrictions Weight Bearing Restrictions: No RLE Weight Bearing: Weight bearing as tolerated    Mobility  Bed Mobility Overal bed mobility: Needs Assistance Bed Mobility: Supine to Sit;Sit to Supine     Supine to sit: Min assist Sit to supine: Min assist   General bed mobility comments: cues for sequence and min assist for R LE management  Transfers Overall transfer level: Needs assistance Equipment used: Rolling walker (2 wheeled) Transfers: Sit to/from Stand Sit to Stand: Min guard;From elevated surface         General transfer comment: Verbal cues for safety and sequencing w/ RW during transfers.  Ambulation/Gait Ambulation/Gait assistance: Min assist;Min guard Ambulation Distance (Feet): 120 Feet Assistive device: Rolling walker (2 wheeled) Gait Pattern/deviations: Step-to pattern;Decreased step length - right;Decreased step length - left;Shuffle;Trunk flexed     General Gait Details: cues for sequence, posture, and position from [employer]            Wheelchair Mobility    Modified Rankin (Stroke Patients Only)       Balance                                    Cognition Arousal/Alertness: Awake/alert Behavior During Therapy: WFL for tasks assessed/performed Overall Cognitive Status: Within Functional Limits for tasks assessed                      Exercises Total Joint Exercises Ankle Circles/Pumps: AROM;Both;15 reps;Supine Quad  Sets: AROM;Both;10 reps;Supine Heel Slides: AAROM;Right;Supine;15 reps Straight Leg Raises: AAROM;Right;10 reps;Supine    General Comments        Pertinent Vitals/Pain Pain Assessment: 0-10 Pain Score: 6  Pain Location: R knee Pain Descriptors / Indicators: Aching;Sore Pain Intervention(s): Limited activity within patient's tolerance;Monitored during session;Premedicated before session;Ice applied    Home Living                      Prior Function            PT Goals (current goals can now be found in the care plan section) Acute Rehab PT Goals Patient Stated Goal: "Get home" PT Goal Formulation: With patient Time For Goal Achievement: 03/13/15 Potential to Achieve Goals: Good Progress towards PT goals: Progressing toward goals    Frequency  7X/week    PT Plan Current plan remains appropriate    Co-evaluation             End of Session Equipment Utilized During Treatment: Gait belt;Right knee immobilizer Activity Tolerance: Patient tolerated treatment well Patient left: in bed;with call bell/phone within reach     Time: 1400-1425 PT Time Calculation (min) (ACUTE ONLY): 25 min  Charges:  $Gait Training: 8-22 mins $Therapeutic Exercise: 8-22 mins                    G Codes:  BRADSHAW,HUNTER 03/06/2015, 3:39 PM

## 2015-03-06 NOTE — Progress Notes (Signed)
CARE MANAGEMENT NOTE 03/06/2015  Patient:  LATRICIA, VALLANCE   Account Number:  [id number]  Date Initiated:  03/06/2015  Documentation initiated by:  Edwyna Shell  Subjective/Objective Assessment:   64 yo female admitted with TKR from home     Action/Plan:   discharge planning   Anticipated DC Date:  03/07/2015   Anticipated DC Plan:  [place]  CM consult      [place]   Choice offered to / List presented to:  C-1 Patient   DME arranged  3-N-1      DME agency  [place] arranged  [place]   Status of service:  Completed, signed off Medicare Important Message given?   (If response is "NO", the following Medicare IM given date fields will be blank) Date Medicare IM given:   Medicare IM given by:   Date Additional Medicare IM given:   Additional Medicare IM given by:    Discharge Disposition:    Per UR Regulation:    If discussed at Long Length of Stay Meetings, dates discussed:    Comments:  03/06/15 Edwyna Shell RN BSN CM [phone] Patient stated that she has used [place] in the past and would like to use them again for [facility] PT. Contacted [employer] rep, and communicated referral. Patient also stated that she needs a 3n1 bariatric size, received order and communicated to [facility] rep. Patient stated that she has a rolling walker and is going home with the help of a friend.

## 2015-03-06 NOTE — Evaluation (Signed)
Physical Therapy Evaluation Patient Details Name: [name] MRN: [id number] DOB: 1951/07/22 Today's Date: 03/06/2015   History of Present Illness  Pt is a 64 y/o female s/p Right TKA revision  Clinical Impression  Pt s/p R TKR revision presents with decreased R LE strength/ROM and post op pain limiting functional mobility.  Pt should progress to dc home with assist of family/friends and HHPT follow up.    Follow Up Recommendations Home health PT    Equipment Recommendations  None recommended by PT    Recommendations for Other Services OT consult     Precautions / Restrictions Precautions Precautions: Fall Restrictions Weight Bearing Restrictions: No RLE Weight Bearing: Weight bearing as tolerated      Mobility  Bed Mobility Overal bed mobility: Needs Assistance Bed Mobility: Sidelying to Sit;Supine to Sit   Sidelying to sit: Min guard Supine to sit: Min assist     General bed mobility comments: NT - OOB with OT  Transfers Overall transfer level: Needs assistance Equipment used: Rolling walker (2 wheeled) Transfers: Sit to/from Stand Sit to Stand: Min assist Stand pivot transfers: Min guard       General transfer comment: Verbal cues for safety and sequencing w/ RW during transfers.  Ambulation/Gait Ambulation/Gait assistance: Min assist Ambulation Distance (Feet): 78 Feet (and 20') Assistive device: Rolling walker (2 wheeled) Gait Pattern/deviations: Step-to pattern;Decreased step length - right;Decreased step length - left;Shuffle;Trunk flexed     General Gait Details: cues for sequence, posture, and position from [employer]            Wheelchair Mobility    Modified Rankin (Stroke Patients Only)       Balance Overall balance assessment: No apparent balance deficits (not formally assessed)                                           Pertinent Vitals/Pain Pain Assessment: 0-10 Pain Score: 4  Pain Location: R  lmee Pain Descriptors / Indicators: Aching;Sore Pain Intervention(s): Limited activity within patient's tolerance;Monitored during session;Premedicated before session;Ice applied    Home Living Family/patient expects to be discharged to:: Private residence Living Arrangements: Non-relatives/Friends;Other relatives Available Help at Discharge: Family;Friend(s);Available 24 hours/day;Available PRN/intermittently Type of Home: Apartment Home Access: Elevator;Other (comment)     Home Layout: One level Home Equipment: [place] - 2 wheels;Adaptive equipment      Prior Function Level of Independence: Independent               Hand Dominance   Dominant Hand: Right    Extremity/Trunk Assessment   Upper Extremity Assessment: Overall WFL for tasks assessed           Lower Extremity Assessment: RLE deficits/detail RLE Deficits / Details: 2+/5 quads with AAROM at knee -10 - 40    Cervical / Trunk Assessment: Normal  Communication   Communication: No difficulties  Cognition Arousal/Alertness: Awake/alert Behavior During Therapy: WFL for tasks assessed/performed Overall Cognitive Status: Within Functional Limits for tasks assessed                      General Comments      Exercises Total Joint Exercises Ankle Circles/Pumps: AROM;Both;15 reps;Supine Quad Sets: AROM;Both;10 reps;Supine Heel Slides: AAROM;Right;Supine;15 reps Straight Leg Raises: AAROM;Right;10 reps;Supine      Assessment/Plan    PT Assessment Patient needs continued PT services  PT Diagnosis Difficulty walking  PT Problem List Decreased strength;Decreased range of motion;Decreased activity tolerance;Decreased mobility;Pain;Obesity;Decreased knowledge of use of DME  PT Treatment Interventions DME instruction;Gait training;Functional mobility training;Therapeutic activities;Therapeutic exercise;Patient/family education   PT Goals (Current goals can be found in the Care Plan section) Acute  Rehab PT Goals Patient Stated Goal: "Get home" PT Goal Formulation: With patient Time For Goal Achievement: 03/13/15 Potential to Achieve Goals: Good    Frequency 7X/week   Barriers to discharge        Co-evaluation               End of Session Equipment Utilized During Treatment: Gait belt;Right knee immobilizer Activity Tolerance: Patient tolerated treatment well Patient left: in chair;with call bell/phone within reach Nurse Communication: Mobility status         Time: [id number] PT Time Calculation (min) (ACUTE ONLY): 45 min   Charges:   PT Evaluation $Initial PT Evaluation Tier I: 1 Procedure PT Treatments $Gait Training: 8-22 mins $Therapeutic Exercise: 8-22 mins   PT G Codes:        BRADSHAW,HUNTER 08-Mar-2015, 12:14 PM

## 2015-03-07 LAB — GLUCOSE, CAPILLARY
Glucose-Capillary: 151 mg/dL — ABNORMAL HIGH (ref 70–99)
Glucose-Capillary: 125 mg/dL — ABNORMAL HIGH (ref 70–99)

## 2015-03-07 LAB — CBC
HCT: 28.4 % — ABNORMAL LOW (ref 36.0–46.0)
Hemoglobin: 9.2 g/dL — ABNORMAL LOW (ref 12.0–15.0)
MCH: 29.6 pg (ref 26.0–34.0)
MCHC: 32.4 g/dL (ref 30.0–36.0)
MCV: 91.3 fL (ref 78.0–100.0)
Platelets: 252 10*3/uL (ref 150–400)
RBC: 3.11 MIL/uL — ABNORMAL LOW (ref 3.87–5.11)
RDW: 13.5 % (ref 11.5–15.5)
WBC: 14.4 10*3/uL — ABNORMAL HIGH (ref 4.0–10.5)

## 2015-03-07 LAB — BASIC METABOLIC PANEL
Anion gap: 9 (ref 5–15)
BUN: 23 mg/dL (ref 6–23)
CO2: 27 mmol/L (ref 19–32)
Calcium: 9.1 mg/dL (ref 8.4–10.5)
Chloride: 99 mmol/L (ref 96–112)
Creatinine, Ser: 1.02 mg/dL (ref 0.50–1.10)
GFR calc Af Amer: 66 mL/min — ABNORMAL LOW (ref >90)
GFR calc non Af Amer: 57 mL/min — ABNORMAL LOW (ref >90)
Glucose, Bld: 175 mg/dL — ABNORMAL HIGH (ref 70–99)
Potassium: 4.2 mmol/L (ref 3.5–5.1)
Sodium: 135 mmol/L (ref 135–145)

## 2015-03-07 MED ORDER — RIVAROXABAN 10 MG PO TABS: 10.0000 mg | ORAL_TABLET | Freq: Every day | ORAL | Status: DC

## 2015-03-07 MED ORDER — CEVIMELINE HCL 30 MG PO CAPS: ORAL_CAPSULE | ORAL | Status: DC

## 2015-03-07 MED ORDER — METHOCARBAMOL 500 MG PO TABS: 500.0000 mg | ORAL_TABLET | Freq: Four times a day (QID) | ORAL | Status: DC | PRN

## 2015-03-07 MED ORDER — CYCLOBENZAPRINE HCL 10 MG PO TABS: 30.0000 mg | ORAL_TABLET | Freq: Every day | ORAL | Status: DC

## 2015-03-07 MED ORDER — LIDOCAINE 5 % EX PTCH: 1.0000 | MEDICATED_PATCH | Freq: Every day | CUTANEOUS | Status: DC | PRN

## 2015-03-07 MED ORDER — DULOXETINE HCL 60 MG PO CPEP: 60.0000 mg | ORAL_CAPSULE | Freq: Every day | ORAL | Status: DC

## 2015-03-07 MED ORDER — FUROSEMIDE 40 MG PO TABS: ORAL_TABLET | ORAL | Status: DC

## 2015-03-07 MED ORDER — OXYCODONE HCL 15 MG PO TABS: 15.0000 mg | ORAL_TABLET | ORAL | Status: DC | PRN

## 2015-03-07 NOTE — Progress Notes (Signed)
Physical Therapy Treatment Patient Details Name: [name] MRN: [id number] DOB: 05/01/1951 Today's Date: 03/07/2015    History of Present Illness Pt is a 64 y/o female s/p Right TKA revision    PT Comments    POD # 3 am session.  Assisted pt OOB to amb in hallway.  Assisted to BR then to recliner to perform TKR TE's followed by ICE.   Follow Up Recommendations  Home health PT     Equipment Recommendations  3in1 (PT)    Recommendations for Other Services       Precautions / Restrictions Precautions Precautions: Fall Restrictions Weight Bearing Restrictions: No RLE Weight Bearing: Weight bearing as tolerated    Mobility  Bed Mobility Overal bed mobility: Needs Assistance Bed Mobility: Supine to Sit     Supine to sit: Min assist     General bed mobility comments: assist R LE and increased time  Transfers Overall transfer level: Needs assistance Equipment used: Rolling walker (2 wheeled) Transfers: Sit to/from Stand Sit to Stand: Supervision         General transfer comment: cues for UE placement  Ambulation/Gait Ambulation/Gait assistance: Supervision;Min guard Ambulation Distance (Feet): 115 Feet Assistive device: Rolling walker (2 wheeled) Gait Pattern/deviations: Step-to pattern;Decreased stance time - right Gait velocity: decreased   General Gait Details: cues for sequence, posture, and position from [employer]            Wheelchair Mobility    Modified Rankin (Stroke Patients Only)       Balance                                    Cognition Arousal/Alertness: Awake/alert Behavior During Therapy: WFL for tasks assessed/performed Overall Cognitive Status: Within Functional Limits for tasks assessed                      Exercises   Total Knee Replacement TE's 10 reps B LE ankle pumps 10 reps towel squeezes 10 reps knee presses 10 reps heel slides  10 reps SAQ's 10 reps SLR's 10 reps ABD Followed by  ICE     General Comments        Pertinent Vitals/Pain Pain Assessment: 0-10 Pain Score: 5  Pain Location: R knee Pain Descriptors / Indicators: Aching;Sore Pain Intervention(s): Ice applied;Repositioned    Home Living                      Prior Function            PT Goals (current goals can now be found in the care plan section) Progress towards PT goals: Progressing toward goals    Frequency  7X/week    PT Plan Current plan remains appropriate    Co-evaluation             End of Session Equipment Utilized During Treatment: Gait belt;Right knee immobilizer Activity Tolerance: Patient tolerated treatment well Patient left: in chair;with family/visitor present     Time: 1100-1130 PT Time Calculation (min) (ACUTE ONLY): 30 min  Charges:  $Gait Training: 8-22 mins $Therapeutic Exercise: 8-22 mins                    G Codes:      Rica Koyanagi  PTA [facility]  Acute  Rehab Pager      [phone]

## 2015-03-07 NOTE — Discharge Summary (Signed)
Physician Discharge Summary   Patient ID: [name] MRN: [id number] DOB/AGE: 09/11/1951 64 y.o.  Admit date: 03/05/2015 Discharge date: 03/07/2015  Primary Diagnosis:  Aseptic loosening, right total knee arthroplasty. Admission Diagnoses:  Past Medical History  Diagnosis Date  . Diabetes mellitus     type II  . GERD (gastroesophageal reflux disease)   . Hypertension   . IBS (irritable bowel syndrome)   . Depression   . Anxiety   . LBP (low back pain)   . Osteoarthritis   . Colitis 2010    microscopic- Dr Henrene Pastor  . Renal insufficiency 2011  . Osteopenia   . Anemia   . Pneumonia     hx of 2014   . Headache   . Sinusitis     currently being treated with antibiotic will complete 03/04/2015  . Neuropathy     feet bilat    Discharge Diagnoses:   Principal Problem:   Failed total right knee replacement Active Problems:   OA (osteoarthritis) of knee  Estimated body mass index is 43.43 kg/(m^2) as calculated from the following:   Height as of this encounter: $RemoveBeforeD'5\' 3"'[id number]$  (1.6 m).   Weight as of this encounter: 111.185 kg (245 lb 1.9 oz).  Procedure:  Procedure(s) (LRB): RIGHT TOTAL KNEE ARTHROPLASTY REVISION (Right)   Consults: None  HPI: [name] is a 64 year old female, had bilateral total knee arthroplasties done several years ago. She had done well until the past couple of years when she started having increased pain in her right knee. Radiographs were consistent with probable loosening of the femoral component as it looked like it had tilted into valgus. Given her pain and change in radiographic status, it was felt that she has a loose total knee and presents now for revision total knee arthroplasty.  Laboratory Data: Admission on 03/05/2015, Discharged on 03/07/2015  Component Date Value Ref Range Status  . Glucose-Capillary 03/05/2015 162* 70 - 99 mg/dL Final  . Comment 1 03/05/2015 Notify RN   Final  . Glucose-Capillary 03/05/2015 119* 70 - 99 mg/dL Final  .  Comment 1 03/05/2015 Document in Chart   Final  . WBC 03/06/2015 14.4* 4.0 - 10.5 K/uL Final  . RBC 03/06/2015 3.12* 3.87 - 5.11 MIL/uL Final  . Hemoglobin 03/06/2015 9.4* 12.0 - 15.0 g/dL Final  . HCT 03/06/2015 28.4* 36.0 - 46.0 % Final  . MCV 03/06/2015 91.0  78.0 - 100.0 fL Final  . MCH 03/06/2015 30.1  26.0 - 34.0 pg Final  . MCHC 03/06/2015 33.1  30.0 - 36.0 g/dL Final  . RDW 03/06/2015 13.1  11.5 - 15.5 % Final  . Platelets 03/06/2015 241  150 - 400 K/uL Final  . Sodium 03/06/2015 137  135 - 145 mmol/L Final  . Potassium 03/06/2015 4.2  3.5 - 5.1 mmol/L Final  . Chloride 03/06/2015 98  96 - 112 mmol/L Final  . CO2 03/06/2015 27  19 - 32 mmol/L Final  . Glucose, Bld 03/06/2015 229* 70 - 99 mg/dL Final  . BUN 03/06/2015 23  6 - 23 mg/dL Final  . Creatinine, Ser 03/06/2015 1.07  0.50 - 1.10 mg/dL Final  . Calcium 03/06/2015 8.8  8.4 - 10.5 mg/dL Final  . GFR calc non Af Amer 03/06/2015 54* >90 mL/min Final  . GFR calc Af Amer 03/06/2015 63* >90 mL/min Final   Comment: (NOTE) The eGFR has been calculated using the CKD EPI equation. This calculation has not been validated in all clinical situations. eGFR's persistently <90  mL/min signify possible Chronic Kidney Disease.   . Anion gap 03/06/2015 12  5 - 15 Final  . Glucose-Capillary 03/05/2015 237* 70 - 99 mg/dL Final  . Glucose-Capillary 03/05/2015 270* 70 - 99 mg/dL Final  . Glucose-Capillary 03/06/2015 165* 70 - 99 mg/dL Final  . Glucose-Capillary 03/06/2015 187* 70 - 99 mg/dL Final  . WBC 03/07/2015 14.4* 4.0 - 10.5 K/uL Final  . RBC 03/07/2015 3.11* 3.87 - 5.11 MIL/uL Final  . Hemoglobin 03/07/2015 9.2* 12.0 - 15.0 g/dL Final  . HCT 03/07/2015 28.4* 36.0 - 46.0 % Final  . MCV 03/07/2015 91.3  78.0 - 100.0 fL Final  . MCH 03/07/2015 29.6  26.0 - 34.0 pg Final  . MCHC 03/07/2015 32.4  30.0 - 36.0 g/dL Final  . RDW 03/07/2015 13.5  11.5 - 15.5 % Final  . Platelets 03/07/2015 252  150 - 400 K/uL Final  . Sodium 03/07/2015  135  135 - 145 mmol/L Final  . Potassium 03/07/2015 4.2  3.5 - 5.1 mmol/L Final  . Chloride 03/07/2015 99  96 - 112 mmol/L Final  . CO2 03/07/2015 27  19 - 32 mmol/L Final  . Glucose, Bld 03/07/2015 175* 70 - 99 mg/dL Final  . BUN 03/07/2015 23  6 - 23 mg/dL Final  . Creatinine, Ser 03/07/2015 1.02  0.50 - 1.10 mg/dL Final  . Calcium 03/07/2015 9.1  8.4 - 10.5 mg/dL Final  . GFR calc non Af Amer 03/07/2015 57* >90 mL/min Final  . GFR calc Af Amer 03/07/2015 66* >90 mL/min Final   Comment: (NOTE) The eGFR has been calculated using the CKD EPI equation. This calculation has not been validated in all clinical situations. eGFR's persistently <90 mL/min signify possible Chronic Kidney Disease.   . Anion gap 03/07/2015 9  5 - 15 Final  . Glucose-Capillary 03/06/2015 218* 70 - 99 mg/dL Final  . Glucose-Capillary 03/06/2015 242* 70 - 99 mg/dL Final  . Comment 1 03/06/2015 Notify RN   Final  . Comment 2 03/06/2015 Document in Chart   Final  . Glucose-Capillary 03/07/2015 151* 70 - 99 mg/dL Final  . Comment 1 03/07/2015 Notify RN   Final  . Glucose-Capillary 03/07/2015 125* 70 - 99 mg/dL Final  . Comment 1 03/07/2015 Notify RN   Final  Hospital Outpatient Visit on 02/26/2015  Component Date Value Ref Range Status  . MRSA, PCR 02/26/2015 NEGATIVE  NEGATIVE Final  . Staphylococcus aureus 02/26/2015 POSITIVE* NEGATIVE Final   Comment:        The Xpert SA Assay (FDA approved for NASAL specimens in patients over 66 years of age), is one component of a comprehensive surveillance program.  Test performance has been validated by [facility] for patients greater than or equal to 79 year old. It is not intended to diagnose infection nor to guide or monitor treatment.   Marland Kitchen aPTT 02/26/2015 33  24 - 37 seconds Final  . WBC 02/26/2015 10.5  4.0 - 10.5 K/uL Final  . RBC 02/26/2015 3.56* 3.87 - 5.11 MIL/uL Final  . Hemoglobin 02/26/2015 10.5* 12.0 - 15.0 g/dL Final  . HCT 02/26/2015 32.6* 36.0 -  46.0 % Final  . MCV 02/26/2015 91.6  78.0 - 100.0 fL Final  . MCH 02/26/2015 29.5  26.0 - 34.0 pg Final  . MCHC 02/26/2015 32.2  30.0 - 36.0 g/dL Final  . RDW 02/26/2015 13.1  11.5 - 15.5 % Final  . Platelets 02/26/2015 253  150 - 400 K/uL Final  . Sodium  02/26/2015 135  135 - 145 mmol/L Final  . Potassium 02/26/2015 4.5  3.5 - 5.1 mmol/L Final  . Chloride 02/26/2015 94* 96 - 112 mmol/L Final  . CO2 02/26/2015 32  19 - 32 mmol/L Final  . Glucose, Bld 02/26/2015 185* 70 - 99 mg/dL Final  . BUN 02/26/2015 43* 6 - 23 mg/dL Final  . Creatinine, Ser 02/26/2015 1.40* 0.50 - 1.10 mg/dL Final  . Calcium 02/26/2015 9.3  8.4 - 10.5 mg/dL Final  . Total Protein 02/26/2015 7.5  6.0 - 8.3 g/dL Final  . Albumin 02/26/2015 3.8  3.5 - 5.2 g/dL Final  . AST 02/26/2015 18  0 - 37 U/L Final  . ALT 02/26/2015 17  0 - 35 U/L Final  . Alkaline Phosphatase 02/26/2015 69  39 - 117 U/L Final  . Total Bilirubin 02/26/2015 0.5  0.3 - 1.2 mg/dL Final  . GFR calc non Af Amer 02/26/2015 39* >90 mL/min Final  . GFR calc Af Amer 02/26/2015 45* >90 mL/min Final   Comment: (NOTE) The eGFR has been calculated using the CKD EPI equation. This calculation has not been validated in all clinical situations. eGFR's persistently <90 mL/min signify possible Chronic Kidney Disease.   . Anion gap 02/26/2015 9  5 - 15 Final  . Prothrombin Time 02/26/2015 13.7  11.6 - 15.2 seconds Final  . INR 02/26/2015 1.04  0.00 - 1.49 Final  . ABO/RH(D) 02/26/2015 O POS   Final  . Antibody Screen 02/26/2015 NEG   Final  . Sample Expiration 02/26/2015 03/08/2015   Final  . Color, Urine 02/26/2015 YELLOW  YELLOW Final  . APPearance 02/26/2015 CLEAR  CLEAR Final  . Specific Gravity, Urine 02/26/2015 1.014  1.005 - 1.030 Final  . pH 02/26/2015 5.5  5.0 - 8.0 Final  . Glucose, UA 02/26/2015 NEGATIVE  NEGATIVE mg/dL Final  . Hgb urine dipstick 02/26/2015 SMALL* NEGATIVE Final  . Bilirubin Urine 02/26/2015 NEGATIVE  NEGATIVE Final  .  Ketones, ur 02/26/2015 NEGATIVE  NEGATIVE mg/dL Final  . Protein, ur 02/26/2015 30* NEGATIVE mg/dL Final  . Urobilinogen, UA 02/26/2015 0.2  0.0 - 1.0 mg/dL Final  . Nitrite 02/26/2015 NEGATIVE  NEGATIVE Final  . Leukocytes, UA 02/26/2015 NEGATIVE  NEGATIVE Final  . Squamous Epithelial / LPF 02/26/2015 RARE  RARE Final  . WBC, UA 02/26/2015 0-2  <3 WBC/hpf Final  . RBC / HPF 02/26/2015 3-6  <3 RBC/hpf Final  . Casts 02/26/2015 HYALINE CASTS* NEGATIVE Final  Appointment on 02/25/2015  Component Date Value Ref Range Status  . WBC 02/25/2015 10.6* 4.0 - 10.5 K/uL Final  . RBC 02/25/2015 3.47* 3.87 - 5.11 Mil/uL Final  . Hemoglobin 02/25/2015 10.3* 12.0 - 15.0 g/dL Final  . HCT 02/25/2015 30.3* 36.0 - 46.0 % Final  . MCV 02/25/2015 87.4  78.0 - 100.0 fl Final  . MCHC 02/25/2015 34.1  30.0 - 36.0 g/dL Final  . RDW 02/25/2015 13.5  11.5 - 15.5 % Final  . Platelets 02/25/2015 236.0  150.0 - 400.0 K/uL Final  . Neutrophils Relative % 02/25/2015 71.0  43.0 - 77.0 % Final  . Lymphocytes Relative 02/25/2015 16.8  12.0 - 46.0 % Final  . Monocytes Relative 02/25/2015 8.1  3.0 - 12.0 % Final  . Eosinophils Relative 02/25/2015 3.6  0.0 - 5.0 % Final  . Basophils Relative 02/25/2015 0.5  0.0 - 3.0 % Final  . Neutro Abs 02/25/2015 7.5  1.4 - 7.7 K/uL Final  . Lymphs Abs 02/25/2015 1.8  0.7 - 4.0 K/uL Final  . Monocytes Absolute 02/25/2015 0.9  0.1 - 1.0 K/uL Final  . Eosinophils Absolute 02/25/2015 0.4  0.0 - 0.7 K/uL Final  . Basophils Absolute 02/25/2015 0.1  0.0 - 0.1 K/uL Final  . Hgb A1c MFr Bld 02/25/2015 7.4* 4.6 - 6.5 % Final   Glycemic Control Guidelines for People with Diabetes:Non Diabetic:  <6%Goal of Therapy: <7%Additional Action Suggested:  >8%   . Color, Urine 02/25/2015 YELLOW  Yellow;Lt. Yellow Final  . APPearance 02/25/2015 CLEAR  Clear Final  . Specific Gravity, Urine 02/25/2015 <=1.005* 1.000 - 1.030 Final  . pH 02/25/2015 6.0  5.0 - 8.0 Final  . Total Protein, Urine 02/25/2015  NEGATIVE  Negative Final  . Urine Glucose 02/25/2015 NEGATIVE  Negative Final  . Ketones, ur 02/25/2015 NEGATIVE  Negative Final  . Bilirubin Urine 02/25/2015 NEGATIVE  Negative Final  . Hgb urine dipstick 02/25/2015 SMALL* Negative Final  . Urobilinogen, UA 02/25/2015 0.2  0.0 - 1.0 Final  . Leukocytes, UA 02/25/2015 NEGATIVE  Negative Final  . Nitrite 02/25/2015 NEGATIVE  Negative Final  . WBC, UA 02/25/2015 0-2/hpf  0-2/hpf Final  . RBC / HPF 02/25/2015 0-2/hpf  0-2/hpf Final  . Squamous Epithelial / LPF 02/25/2015 Rare(0-4/hpf)  Rare(0-4/hpf) Final  Procedure visit on 02/04/2015  Component Date Value Ref Range Status  . Glucose-Capillary 02/04/2015 161* 70 - 99 mg/dL Final  . Glucose-Capillary 02/04/2015 138* 70 - 99 mg/dL Final  . Comment 1 02/04/2015 Notify RN   Final     X-Rays:No results found.  EKG: Orders placed or performed during the hospital encounter of 02/26/15  . EKG 12-Lead  . EKG 12-Lead     Hospital Course: [name] is a 64 y.o. who was admitted to [facility]. They were brought to the operating room on 03/05/2015 and underwent Procedure(s): RIGHT TOTAL KNEE ARTHROPLASTY REVISION.  Patient tolerated the procedure well and was later transferred to the recovery room and then to the orthopaedic floor for postoperative care.  They were given PO and IV analgesics for pain control following their surgery.  They were given 24 hours of postoperative antibiotics of      Anti-infectives    Start     Dose/Rate Route Frequency Ordered Stop   03/06/15 0600  ceFAZolin (ANCEF) IVPB 2 g/50 mL premix     2 g 100 mL/hr over 30 Minutes Intravenous On call to O.R. 03/05/15 1245 03/05/15 1455   03/05/15 2100  ceFAZolin (ANCEF) IVPB 2 g/50 mL premix     2 g 100 mL/hr over 30 Minutes Intravenous Every 6 hours 03/05/15 1810 03/06/15 0343     and started on DVT prophylaxis in the form of Xarelto.   PT and OT were ordered for total joint protocol.  Discharge planning  consulted to help with postop disposition and equipment needs.  Patient had a tough night on the evening of surgery with moderate pain.  They started to get up OOB with therapy on day one. Hemovac drain was pulled without difficulty.  Continued to work with therapy into day two.  Dressing was changed on day two and the incision was healing well.  Patient was seen in rounds by Dr. Maureen Ralphs and was ready to go home.  Discharge home with home health Diet - Cardiac diet and Renal diet Follow up - in 2 weeks Activity - WBAT Disposition - Home Condition Upon Discharge - Good D/C Meds - See DC Summary DVT Prophylaxis - Xarelto  Medication List    STOP taking these medications        buPROPion 150 MG 24 hr tablet  Commonly known as:  WELLBUTRIN XL     carvedilol 25 MG tablet  Commonly known as:  COREG     cefUROXime 500 MG tablet  Commonly known as:  CEFTIN     EQL VITAMIN D3 1000 UNITS tablet  Generic drug:  Cholecalciferol     Fish Oil 1200 MG Caps     glimepiride 1 MG tablet  Commonly known as:  AMARYL     ibuprofen 200 MG tablet  Commonly known as:  ADVIL,MOTRIN     LUTEIN PO     multivitamin with minerals Tabs tablet     oxyCODONE 15 MG immediate release tablet  Commonly known as:  ROXICODONE     OxyCODONE 20 mg T12a 12 hr tablet  Commonly known as:  OXYCONTIN     PSYLLIUM HUSK PO      TAKE these medications        cevimeline 30 MG capsule  Commonly known as:  EVOXAC  2 in the morning and 1 in the evening     cyclobenzaprine 10 MG tablet  Commonly known as:  FLEXERIL  Take 3 tablets (30 mg total) by mouth at bedtime.     DULoxetine 60 MG capsule  Commonly known as:  CYMBALTA  Take 1 capsule (60 mg total) by mouth at bedtime.     furosemide 40 MG tablet  Commonly known as:  LASIX  TAKE 1/2 TO 1 TABLET BY MOUTH ONCE DAILY AS NEEDED FOR SWELLING     glucose blood test strip  Commonly known as:  ONE TOUCH TEST STRIPS  Use as instructed     lidocaine 5  %  Commonly known as:  LIDODERM  Place 1-3 patches onto the skin daily as needed (Pain).     LORazepam 2 MG tablet  Commonly known as:  ATIVAN  TAKE 1/2 TABLET BY MOUTH EVERY 6 HOURS AS NEEDED FOR ANXIETY     metFORMIN 1000 MG tablet  Commonly known as:  GLUCOPHAGE  TAKE 1 TABLET BY MOUTH 2 TIMES DAILY WITH A MEAL     methocarbamol 500 MG tablet  Commonly known as:  ROBAXIN  Take 1 tablet (500 mg total) by mouth every 6 (six) hours as needed for muscle spasms.     omeprazole 20 MG capsule  Commonly known as:  PRILOSEC  Take 1 capsule (20 mg total) by mouth daily.     onetouch ultrasoft lancets  Use as instructed     oxybutynin 5 MG tablet  Commonly known as:  DITROPAN  Take 0.5 tablets (2.5 mg total) by mouth daily.     polyvinyl alcohol 1.4 % ophthalmic solution  Commonly known as:  LIQUIFILM TEARS  Place 1 drop into both eyes daily as needed for dry eyes.     rivaroxaban 10 MG Tabs tablet  Commonly known as:  XARELTO  Take 1 tablet (10 mg total) by mouth daily with breakfast.       Follow-up Information    Follow up with Gearlean Alf, MD. Schedule an appointment as soon as possible for a visit on 03/18/2015.   Specialty:  Orthopedic Surgery   Why:  Call [phone] Monday to make the appointment   Contact information:   [address] [place] [address] [phone]       Signed: Arlee Muslim, PA-C Orthopaedic Surgery 03/27/2015, 8:24 AM

## 2015-03-07 NOTE — Progress Notes (Signed)
Occupational Therapy Treatment Patient Details Name: SYESHA TESFAI MRN: [id number] DOB: 12-15-1950 Today's Date: 03/07/2015    History of present illness Pt is a 64 y/o female s/p Right TKA revision   OT comments  Pt is progressing towards goals:  Verbalizes understanding of all education. Return demonstrated toilet and shower transfers. AE will get easier as she is able to lift leg more.    Follow Up Recommendations  No OT follow up;Supervision - Intermittent    Equipment Recommendations  3 in 1 bedside comode    Recommendations for Other Services      Precautions / Restrictions Precautions Precautions: Fall Restrictions Weight Bearing Restrictions: No RLE Weight Bearing: Weight bearing as tolerated       Mobility Bed Mobility         Supine to sit: Min assist     General bed mobility comments: assist for RLE, HOB raised, cues for technique  Transfers   Equipment used: Rolling walker (2 wheeled) Transfers: Sit to/from Stand Sit to Stand: Supervision         General transfer comment: cues for UE placement    Balance                                   ADL       Grooming: Supervision/safety;Standing               Lower Body Dressing: Minimal assistance;Sit to/from stand   Toilet Transfer: Supervision/safety;Ambulation;BSC   Toileting- Water quality scientist and Hygiene: Supervision/safety;Sit to/from stand   Tub/ Shower Transfer: Min guard;Walk-in shower;Ambulation     General ADL Comments: reviewed AE.  Educated on crossing LLE under R ankle to help lift leg. Pt has a reacher at home, she will plan to get a long netted sponge on a stick, leg lifter and toilet aide (as she has difficulty reaching due to shoulder).  Recommended niece assist with dressing prior to classes initially      Vision                     Perception     Praxis      Cognition   Behavior During Therapy: [facility] for tasks  assessed/performed Overall Cognitive Status: Within Functional Limits for tasks assessed                       Extremity/Trunk Assessment               Exercises     Shoulder Instructions       General Comments      Pertinent Vitals/ Pain       Pain Score: 5  Pain Location: R knee Pain Descriptors / Indicators: Aching;Throbbing Pain Intervention(s): Limited activity within patient's tolerance;Monitored during session;Premedicated before session;Ice applied  Home Living                                          Prior Functioning/Environment              Frequency Min 2X/week     Progress Toward Goals  OT Goals(current goals can now be found in the care plan section)  Progress towards OT goals: Progressing toward goals     Plan      Co-evaluation  End of Session     Activity Tolerance Patient tolerated treatment well   Patient Left in chair;with call bell/phone within reach   Nurse Communication          Time: [id number] OT Time Calculation (min): 24 min  Charges: OT General Charges $OT Visit: 1 Procedure OT Treatments $Self Care/Home Management : 23-37 mins  SPENCER,MARYELLEN 03/07/2015, 10:19 AM  Lesle Chris, OTR/L [phone] 03/07/2015

## 2015-03-07 NOTE — Progress Notes (Signed)
[facility]  [facility] is providing the following services: x-wide commode - per CM, patient is setup with [facility] for HHPT.  If patient discharges after hours, please call [phone].   Linward Headland 03/07/2015, 12:44 PM

## 2015-03-07 NOTE — Progress Notes (Signed)
   Subjective: 2 Days Post-Op Procedure(s) (LRB): RIGHT TOTAL KNEE ARTHROPLASTY REVISION (Right) Patient reports pain as mild.   Patient seen in rounds with Dr. Wynelle Link. Patient is well, and has had no acute complaints or problems Patient is ready to go home  Objective: Vital signs in last 24 hours: Temp:  [98.4 F (36.9 C)-99.1 F (37.3 C)] 99.1 F (37.3 C) (03/25 0600) Pulse Rate:  [69-82] 75 (03/25 0600) Resp:  [15-16] 16 (03/25 0600) BP: (148-163)/(75-82) 163/79 mmHg (03/25 0600) SpO2:  [93 %-98 %] 93 % (03/25 0600)  Intake/Output from previous day:  Intake/Output Summary (Last 24 hours) at 03/07/15 0752 Last data filed at 03/07/15 0735  Gross per 24 hour  Intake 1023.25 ml  Output   3450 ml  Net -2426.75 ml    Intake/Output this shift: Total I/O In: -  Out: 600 [Urine:600]  Labs:  Recent Labs  03/06/15 0505 03/07/15 0449  HGB 9.4* 9.2*    Recent Labs  03/06/15 0505 03/07/15 0449  WBC 14.4* 14.4*  RBC 3.12* 3.11*  HCT 28.4* 28.4*  PLT 241 252    Recent Labs  03/06/15 0505 03/07/15 0449  NA 137 135  K 4.2 4.2  CL 98 99  CO2 27 27  BUN 23 23  CREATININE 1.07 1.02  GLUCOSE 229* 175*  CALCIUM 8.8 9.1   No results for input(s): LABPT, INR in the last 72 hours.  EXAM: General - Patient is Alert, Appropriate and Oriented Extremity - Neurovascular intact Sensation intact distally Dorsiflexion/Plantar flexion intact Incision - clean, dry, no drainage, healing Motor Function - intact, moving foot and toes well on exam.   Assessment/Plan: 2 Days Post-Op Procedure(s) (LRB): RIGHT TOTAL KNEE ARTHROPLASTY REVISION (Right) Procedure(s) (LRB): RIGHT TOTAL KNEE ARTHROPLASTY REVISION (Right) Past Medical History  Diagnosis Date  . Diabetes mellitus     type II  . GERD (gastroesophageal reflux disease)   . Hypertension   . IBS (irritable bowel syndrome)   . Depression   . Anxiety   . LBP (low back pain)   . Osteoarthritis   . Colitis 2010      microscopic- Dr Henrene Pastor  . Renal insufficiency 2011  . Osteopenia   . Anemia   . Pneumonia     hx of 2014   . Headache   . Sinusitis     currently being treated with antibiotic will complete 03/04/2015  . Neuropathy     feet bilat    Principal Problem:   Failed total right knee replacement Active Problems:   OA (osteoarthritis) of knee  Estimated body mass index is 43.43 kg/(m^2) as calculated from the following:   Height as of this encounter: 5\' 3"  (1.6 m).   Weight as of this encounter: 111.185 kg (245 lb 1.9 oz). Up with therapy Discharge home with home health Diet - Cardiac diet and Renal diet Follow up - in 2 weeks Activity - WBAT Disposition - Home Condition Upon Discharge - Good D/C Meds - See DC Summary DVT Prophylaxis - Xarelto  Arlee Muslim, PA-C Orthopaedic Surgery 03/07/2015, 7:52 AM

## 2015-03-07 NOTE — Progress Notes (Signed)
Physical Therapy Treatment Patient Details Name: [name] MRN: [id number] DOB: 21-Feb-1951 Today's Date: 03/07/2015    History of Present Illness Pt is a 64 y/o female s/p Right TKA revision    PT Comments    POD # 3 pm session.  Asssited pt OOB to amb to BR then back to bed.  Pt demon increased anxiety about going home.    Follow Up Recommendations  Home health PT     Equipment Recommendations  3in1 (PT)    Recommendations for Other Services       Precautions / Restrictions Precautions Precautions: Fall Restrictions Weight Bearing Restrictions: No RLE Weight Bearing: Weight bearing as tolerated    Mobility  Bed Mobility Overal bed mobility: Needs Assistance Bed Mobility: Supine to Sit     Supine to sit: Min assist     General bed mobility comments: assist R LE and increased time  Transfers Overall transfer level: Needs assistance Equipment used: Rolling walker (2 wheeled) Transfers: Sit to/from Stand Sit to Stand: Supervision         General transfer comment: cues for UE placement  Ambulation/Gait Ambulation/Gait assistance: Supervision;Min guard Ambulation Distance (Feet): 15 Feet Assistive device: Rolling walker (2 wheeled) Gait Pattern/deviations: Step-to pattern;Decreased stance time - right Gait velocity: decreased   General Gait Details: cues for sequence, posture, and position from [employer]            Wheelchair Mobility    Modified Rankin (Stroke Patients Only)       Balance                                    Cognition Arousal/Alertness: Awake/alert Behavior During Therapy: WFL for tasks assessed/performed Overall Cognitive Status: Within Functional Limits for tasks assessed                      Exercises      General Comments        Pertinent Vitals/Pain Pain Assessment: 0-10 Pain Score: 5  Pain Location: R knee Pain Descriptors / Indicators: Aching;Sore Pain Intervention(s): Ice  applied;Repositioned    Home Living                      Prior Function            PT Goals (current goals can now be found in the care plan section) Progress towards PT goals: Progressing toward goals    Frequency  7X/week    PT Plan Current plan remains appropriate    Co-evaluation             End of Session Equipment Utilized During Treatment: Gait belt;Right knee immobilizer Activity Tolerance: Patient tolerated treatment well Patient left: in chair;with family/visitor present     Time: [id number] PT Time Calculation (min) (ACUTE ONLY): 19 min  Charges:  $Gait Training: 8-22 mins                    G Codes:      Rica Koyanagi  PTA [facility]  Acute  Rehab Pager      [phone]

## 2015-03-07 NOTE — Plan of Care (Signed)
Problem: Consults Goal: Diagnosis- Total Joint Replacement Outcome: Completed/Met Date Met:  03/07/15 Revision Total Knee RIGHT  Problem: Phase III Progression Outcomes Goal: Anticoagulant follow-up in place Outcome: Not Applicable Date Met:  03/07/15 Xarelto VTE, no f/u needed.     

## 2015-03-10 NOTE — Progress Notes (Signed)
Discharge summary sent to payer through [facility]  

## 2015-03-24 ENCOUNTER — Ambulatory Visit: Payer: 59 | Attending: Orthopedic Surgery | Admitting: Physical Therapy

## 2015-03-24 ENCOUNTER — Telehealth: Payer: Self-pay | Admitting: *Deleted

## 2015-03-24 DIAGNOSIS — M25461 Effusion, right knee: Secondary | ICD-10-CM | POA: Insufficient documentation

## 2015-03-24 DIAGNOSIS — R29898 Other symptoms and signs involving the musculoskeletal system: Secondary | ICD-10-CM | POA: Insufficient documentation

## 2015-03-24 DIAGNOSIS — M25561 Pain in right knee: Secondary | ICD-10-CM | POA: Insufficient documentation

## 2015-03-24 DIAGNOSIS — M25661 Stiffness of right knee, not elsewhere classified: Secondary | ICD-10-CM | POA: Insufficient documentation

## 2015-03-24 NOTE — Telephone Encounter (Signed)
Left msg on triage stating that she had total knee replacement on 03/05/15. Was taking extra pain medication due to the surgery. Wanting to get refill on oxycodone which suppose to be due on 03/29/15...Johny Chess

## 2015-03-24 NOTE — Therapy (Signed)
[place], [place], [address] Phone: [phone]   Fax:  [phone]  Physical Therapy Evaluation  Patient Details  Name: [name] MRN: [id number] Date of Birth: [birth year] Referring Provider:  Gaynelle Arabian, MD  Encounter Date: 03/24/2015      PT End of Session - 03/24/15 1737    Visit Number 1   Number of Visits 12   Date for PT Re-Evaluation 05/05/15   PT Start Time 1500   PT Stop Time 1545   PT Time Calculation (min) 45 min   Activity Tolerance Patient tolerated treatment well;Patient limited by pain   Behavior During Therapy [facility] for tasks assessed/performed      Past Medical History  Diagnosis Date  . Diabetes mellitus     type II  . GERD (gastroesophageal reflux disease)   . Hypertension   . IBS (irritable bowel syndrome)   . Depression   . Anxiety   . LBP (low back pain)   . Osteoarthritis   . Colitis 2010    microscopic- Dr Henrene Pastor  . Renal insufficiency 2011  . Osteopenia   . Anemia   . Pneumonia     hx of 2014   . Headache   . Sinusitis     currently being treated with antibiotic will complete 03/04/2015  . Neuropathy     feet bilat     Past Surgical History  Procedure Laterality Date  . Foramen magnum ependymoma surgery  2003    Dr Rita Ohara  . Total knee arthroplasty      L 2008, R 2009- Dr Maureen Ralphs  . Joint replacement Bilateral   . Tonsillectomy    . Nasal sinus surgery      1973   . Total knee revision Right 03/05/2015    Procedure: RIGHT TOTAL KNEE ARTHROPLASTY REVISION;  Surgeon: Gaynelle Arabian, MD;  Location: [facility];  Service: Orthopedics;  Laterality: Right;    There were no vitals filed for this visit.  Visit Diagnosis:  Right knee pain - Plan: PT plan of care cert/re-cert  Knee stiffness, right - Plan: PT plan of care cert/re-cert  Decreased ROM of right knee - Plan: PT plan of care cert/re-cert  Knee swelling, right - Plan: PT plan of care  cert/re-cert  Weakness of right leg - Plan: PT plan of care cert/re-cert      Subjective Assessment - 03/24/15 1505    Subjective pt is a 64 y.o F S/P R TKR revision 03/05/2015. She reports she fell on a step after clipping her foot and reported the knee was loose. she reports the knee has gotten better since the revision with pain only in her mid/upper thigh and calf   Limitations Standing;Sitting   How long can you sit comfortably? depends on the chair at most 1 hour   How long can you stand comfortably? 15-20 min   How long can you walk comfortably? 15    Diagnostic tests X-ray 03/05/2015 for TKR after surgery    Patient Stated Goals to be able to travel independent, to ambulate without a cane, be able to navigate steps. (to keep up with a group going to [place] and [place])   Currently in Pain? Yes   Pain Score 5   took pain medication @ 1:30pm   Pain Location Knee   Pain Orientation Right   Pain Descriptors / Indicators Aching   Pain Type Surgical pain;Acute pain   Pain Onset More than a month  ago   Pain Frequency Constant   Aggravating Factors  Stair, prolonged stand/walking,    Pain Relieving Factors taking pain medication and lidocain patches, ice, and rest   Effect of Pain on Daily Activities limited endurance and strenght and difficulty with stairs            [facility] PT Assessment - 03/24/15 1514    Assessment   Medical Diagnosis S/P R TKR Revision   Onset Date 03/05/15   Next MD Visit --  unsure but reports 3 weeks.    Prior Therapy Yes   Initial TKR   Precautions   Precautions None;Knee   Precaution Comments No driving this week and next   Restrictions   Weight Bearing Restrictions No   Balance Screen   Has the patient fallen in the past 6 months No   [place] Private residence   Living Arrangements Non-relatives/Friends  neice   Type of Home Other(Comment)  Medical sales representative;Other (comment)  (stair 3 flights)    Home Layout One level   Home Equipment Other (comment)  knee brace   Prior Function   Level of Independence Independent with homemaking with ambulation;Independent with basic ADLs;Independent with gait;Independent with transfers   Vocation Retired   [employer], walking,    Observation/Other Assessments   Focus on Therapeutic Outcomes (FOTO)  64% limitation (projected to 41%   Posture/Postural Control   Posture/Postural Control Postural limitations   Postural Limitations Rounded Shoulders;Forward head   ROM / Strength   AROM / PROM / Strength AROM;PROM;Strength   AROM   AROM Assessment Site Knee   Right Knee Extension -10   Right Knee Flexion 93   Left Knee Extension 142   Left Knee Flexion --  +2   PROM   PROM Assessment Site Knee   Right/Left Knee Right   Left Knee Extension -5   Left Knee Flexion 110   Strength   Strength Assessment Site Knee   Right Knee Flexion 4-/5   Right Knee Extension 4-/5   Left Knee Flexion 5/5   Left Knee Extension 5/5                   [facility] Adult PT Treatment/Exercise - 03/24/15 1514    Ambulation/Gait   Ambulation/Gait Yes   Gait Pattern Step-to pattern;Decreased step length - right;Decreased step length - left;Right circumduction;Antalgic;Wide base of support   Ambulation Surface Level   Gait velocity .90  10 MWT : 11 sec   Knee/Hip Exercises: Stretches   Passive Hamstring Stretch 2 reps;30 seconds   Hip Flexor Stretch 2 reps;30 seconds   Knee/Hip Exercises: Supine   Heel Slides AAROM;Strengthening;Right;1 set;5 reps  with strap   Straight Leg Raises AROM;Strengthening;Right;1 set;10 reps  with quad set                PT Education - 03/24/15 1737    Education provided Yes   Education Details Evaluation, POC, goals, HEP   Person(s) Educated Patient   Methods Explanation   Comprehension Verbalized understanding;Returned demonstration          PT Short Term Goals - 03/24/15 1904    PT SHORT TERM GOAL  #1   Title pt will be I with basic HEP (04/14/2015)   Time 3   Period Weeks   Status New           PT Long Term Goals - 03/24/15 1905    PT LONG  TERM GOAL #1   Title She will be I with Advanced HEP (05/05/2015)   Time 6   Period Weeks   Status New   PT LONG TERM GOAL #2   Title pt will increase R knee flexion AROM to >135 degrees and ext to 0 degrees to assist with an effiecent gait pattern (05/05/2015)   Time 6   Period Weeks   Status New   PT LONG TERM GOAL #3   Title pt will increase R knee strength to greater >4+/5 to assist with endurance for prolonged walking/ stand to help with pt goal of walking through [place] (05/05/2015)   Time 6   Period Weeks   Status New   PT LONG TERM GOAL #4   Title pt will decrease her 10 MWT by >3 sec to assist with functional and safe gait (05/05/2015)   Time 6   Period Weeks   Status New   PT LONG TERM GOAL #5   Title she will be able to verbalize and demonstrate techniques to reduce risk of R knee reinjury by postural awareness, proper lifting/carrying and gait mechanics, and HEP (05/05/2015)   Time 6   Period Weeks   Status New               Plan - 03/24/15 1738    Clinical Impression Statement [name] presents to OPPT S/P R TKR Revision on 03/05/2015.  The incision site appears to be healing well. She demonstrates limited R knee ROM with -10/93 actively and -5/110 Passive. strength for the R knee was assessed at 4-/5 with no pain during testing.  She has a 10 MWT  of .9 M/sec with an antalgic hip curcumeducted gait with a wide BOS and limited step length bil. She has a goal of traveling to [place] in september and wants to be able to walk/climb stairs without difficulty.  She would benefit from skilled physical therapy to improve her function by addressing the impairments listed.    Pt will benefit from skilled therapeutic intervention in order to improve on the following deficits Abnormal gait;Pain;Increased edema;Decreased  activity tolerance;Decreased endurance;Difficulty walking;Decreased strength;Decreased range of motion;Postural dysfunction;Impaired flexibility   Rehab Potential Good   PT Frequency 2x / week   PT Duration 6 weeks   PT Treatment/Interventions ADLs/Self Care Home Management;Electrical Stimulation;Therapeutic exercise;Balance training;Moist Heat;Stair training;Gait training;Patient/family education;Neuromuscular re-education;Manual techniques;Passive range of motion;Ultrasound;Therapeutic activities   PT Next Visit Plan assess response to HEP, Knee mobilization, stretching, Strengthening, Gait training, balance training.    PT Home Exercise Plan hamstring and hip flexor stretch, SLR with quad set, heel slides with strap, sidelying hip abduction   Consulted and Agree with Plan of Care Patient         Problem List Patient Active Problem List   Diagnosis Date Noted  . 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 OA (osteoarthritis) of knee 03/05/2015  . Rib pain on left side 02/27/2015  . URI, acute 09/17/2013  . Swelling of limb-Bilateral leg Left > than right 07/11/2013  . Numbness-Left foot 07/11/2013  . Varicose veins of lower extremities with other complications [id number]  . Left hand pain 02/09/2013  . Right shoulder pain 09/12/2012  . Edema 04/24/2012  . Grief 11/10/2011  . ABSCESS, TOOTH 02/03/2011  . OSTEOPENIA 10/28/2010  . HYPERKALEMIA 07/15/2010  . ARTHRALGIA 04/08/2010  . FATIGUE 04/08/2010  . XEROSTOMIA 02/04/2010  . ECZEMA 10/29/2009  . TOBACCO USE, QUIT 10/29/2009  . Anemia 06/18/2009  . RENAL INSUFFICIENCY 06/18/2009  .  Diarrhea 06/18/2009  . OPACITY, VITREOUS HUMOR 01/15/2009  . COLITIS 10/16/2008  . KNEE PAIN 04/10/2008  . HYPERCHOLESTEROLEMIA 01/10/2008  . ANXIETY 11/08/2007  . Depression 11/08/2007  . Osteoarthritis 11/08/2007  . LOW BACK PAIN 11/08/2007  . Diabetes mellitus type 2, controlled 10/07/2007  . Essential hypertension 10/07/2007  .  GERD 10/07/2007  . MICROALBUMINURIA 10/07/2007   Starr Lake PT, DPT, LAT, ATC  03/24/2015  7:21 PM   [place] [facility] [address] [place], [place], [address] Phone: [phone]   Fax:  [phone]

## 2015-03-24 NOTE — Patient Instructions (Signed)
   Kristoffer Leamon PT, DPT, LAT, ATC  [facility] Phone: [phone]     

## 2015-03-26 ENCOUNTER — Ambulatory Visit (INDEPENDENT_AMBULATORY_CARE_PROVIDER_SITE_OTHER): Payer: 59 | Admitting: Internal Medicine

## 2015-03-26 ENCOUNTER — Encounter: Payer: Self-pay | Admitting: Internal Medicine

## 2015-03-26 VITALS — BP 138/82 | HR 76 | Temp 97.8°F | Resp 18 | Wt 240.0 lb

## 2015-03-26 DIAGNOSIS — M25569 Pain in unspecified knee: Secondary | ICD-10-CM | POA: Diagnosis not present

## 2015-03-26 DIAGNOSIS — M545 Low back pain, unspecified: Secondary | ICD-10-CM

## 2015-03-26 DIAGNOSIS — N259 Disorder resulting from impaired renal tubular function, unspecified: Secondary | ICD-10-CM | POA: Diagnosis not present

## 2015-03-26 DIAGNOSIS — E119 Type 2 diabetes mellitus without complications: Secondary | ICD-10-CM | POA: Diagnosis not present

## 2015-03-26 MED ORDER — PIOGLITAZONE HCL 15 MG PO TABS: 15.0000 mg | ORAL_TABLET | Freq: Every day | ORAL | Status: DC

## 2015-03-26 MED ORDER — GLIMEPIRIDE 1 MG PO TABS: ORAL_TABLET | ORAL | Status: DC

## 2015-03-26 MED ORDER — OXYCODONE HCL 15 MG PO TABS: 15.0000 mg | ORAL_TABLET | Freq: Four times a day (QID) | ORAL | Status: DC | PRN

## 2015-03-26 MED ORDER — CARVEDILOL 25 MG PO TABS: 25.0000 mg | ORAL_TABLET | Freq: Two times a day (BID) | ORAL | Status: DC

## 2015-03-26 MED ORDER — OXYCODONE HCL 15 MG PO TABS: 15.0000 mg | ORAL_TABLET | ORAL | Status: DC | PRN

## 2015-03-26 MED ORDER — BUPROPION HCL ER (XL) 300 MG PO TB24: 300.0000 mg | ORAL_TABLET | Freq: Every day | ORAL | Status: DC

## 2015-03-26 MED ORDER — OXYCODONE HCL ER 20 MG PO T12A: 20.0000 mg | EXTENDED_RELEASE_TABLET | Freq: Two times a day (BID) | ORAL | Status: DC

## 2015-03-26 NOTE — Assessment & Plan Note (Signed)
Labs

## 2015-03-26 NOTE — Assessment & Plan Note (Signed)
B severe OA, s/p B TKR, s/p R knee TKR re-do 3/16  Chronic and severe. Chronic opioid use.  Potential benefits of a long term opioids use as well as potential risks (i.e. addiction risk, apnea etc) and complications (i.e. Somnolence, constipation and others) were explained to the patient and were aknowledged. 7/13 L knee contision

## 2015-03-26 NOTE — Assessment & Plan Note (Signed)
Glimeperide, Metformin

## 2015-03-26 NOTE — Progress Notes (Signed)
   Subjective:     HPI  Pt is s/p R knee TKR re-do 6 wks ago (Dr Wynelle Link) - used more Roxycodone post-op - out Retired Dec 16, 2014  The patient presents for a follow-up of  chronic hypertension, chronic dyslipidemia, type 2 diabetes controlled with medicines. F/u chronic LBP and B knee pain. F/u B LE swelling (has had it before, used compr hose)   BP Readings from Last 3 Encounters:  03/26/15 138/82  03/07/15 168/80  02/27/15 168/92   Wt Readings from Last 3 Encounters:  03/26/15 240 lb (108.863 kg)  03/05/15 245 lb 1.9 oz (111.185 kg)  02/27/15 245 lb 1.9 oz (111.186 kg)     Review of Systems  Constitutional: Positive for fatigue. Negative for chills, activity change, appetite change and unexpected weight change.  HENT: Negative for congestion, mouth sores and sinus pressure.   Eyes: Negative for visual disturbance.  Respiratory: Negative for cough and chest tightness.   Genitourinary: Negative for frequency, difficulty urinating and vaginal pain.  Musculoskeletal: Positive for back pain, arthralgias and gait problem.  Skin: Negative for pallor and rash.  Neurological: Negative for dizziness, tremors and weakness.  Psychiatric/Behavioral: Positive for sleep disturbance and dysphoric mood. Negative for suicidal ideas and confusion. The patient is nervous/anxious.          Objective:   Physical Exam  Constitutional: She appears well-developed. No distress.  obese  HENT:  Head: Normocephalic.  Right Ear: External ear normal.  Left Ear: External ear normal.  Nose: Nose normal.  Mouth/Throat: Oropharynx is clear and moist.  Eyes: Conjunctivae are normal. Pupils are equal, round, and reactive to light. Right eye exhibits no discharge. Left eye exhibits no discharge.  Neck: Normal range of motion. Neck supple. No JVD present. No tracheal deviation present. No thyromegaly present.  Cardiovascular: Normal rate, regular rhythm and normal heart sounds.   Pulmonary/Chest: No  stridor. No respiratory distress. She has no wheezes.  Abdominal: Soft. Bowel sounds are normal. She exhibits no distension and no mass. There is no tenderness. There is no rebound and no guarding.  Musculoskeletal: She exhibits edema (trace) and tenderness.  Lymphadenopathy:    She has no cervical adenopathy.  Neurological: She displays normal reflexes. No cranial nerve deficit. She exhibits normal muscle tone. Coordination normal.  Skin: No rash noted. No erythema.  Psychiatric: Her behavior is normal. Judgment and thought content normal.  R knee is w/a healing scar   Lab Results  Component Value Date   WBC 14.4* 03/07/2015   HGB 9.2* 03/07/2015   HCT 28.4* 03/07/2015   PLT 252 03/07/2015   GLUCOSE 175* 03/07/2015   CHOL 215* 09/25/2014   TRIG 154.0* 09/25/2014   HDL 46.00 09/25/2014   LDLCALC 138* 09/25/2014   ALT 17 02/26/2015   AST 18 02/26/2015   NA 135 03/07/2015   K 4.2 03/07/2015   CL 99 03/07/2015   CREATININE 1.02 03/07/2015   BUN 23 03/07/2015   CO2 27 03/07/2015   TSH 1.64 07/25/2013   INR 1.04 02/26/2015   HGBA1C 7.4* 02/25/2015         Assessment & Plan:

## 2015-03-26 NOTE — Progress Notes (Signed)
Pre visit review using our clinic review tool, if applicable. No additional management support is needed unless otherwise documented below in the visit note. 

## 2015-03-26 NOTE — Assessment & Plan Note (Signed)
Chronic opioid use. Oxycontin and Roxycodone (early).

## 2015-03-31 NOTE — Telephone Encounter (Signed)
Is this ok to refill pls advise ASAP...[name]

## 2015-04-01 ENCOUNTER — Ambulatory Visit: Payer: 59 | Admitting: Internal Medicine

## 2015-04-01 NOTE — Telephone Encounter (Signed)
OK. Thx

## 2015-04-03 ENCOUNTER — Ambulatory Visit: Payer: 59 | Admitting: Physical Therapy

## 2015-04-03 DIAGNOSIS — M25561 Pain in right knee: Secondary | ICD-10-CM | POA: Diagnosis not present

## 2015-04-03 DIAGNOSIS — R29898 Other symptoms and signs involving the musculoskeletal system: Secondary | ICD-10-CM

## 2015-04-03 DIAGNOSIS — M25661 Stiffness of right knee, not elsewhere classified: Secondary | ICD-10-CM

## 2015-04-03 DIAGNOSIS — M25461 Effusion, right knee: Secondary | ICD-10-CM

## 2015-04-03 NOTE — Therapy (Signed)
[place], [place], [address] Phone: [phone]   Fax:  [phone]  Physical Therapy Treatment  Patient Details  Name: [name] MRN: [id number] Date of Birth: [birth year] Referring Provider:  Cassandria Anger, MD  Encounter Date: 04/03/2015      PT End of Session - 04/03/15 1747    Visit Number 2   Number of Visits 12   Date for PT Re-Evaluation 05/05/15   PT Start Time [id number]   PT Stop Time 1513   PT Time Calculation (min) 56 min   Activity Tolerance Patient tolerated treatment well      Past Medical History  Diagnosis Date  . Diabetes mellitus     type II  . GERD (gastroesophageal reflux disease)   . Hypertension   . IBS (irritable bowel syndrome)   . Depression   . Anxiety   . LBP (low back pain)   . Osteoarthritis   . Colitis 2010    microscopic- Dr Henrene Pastor  . Renal insufficiency 2011  . Osteopenia   . Anemia   . Pneumonia     hx of 2014   . Headache   . Sinusitis     currently being treated with antibiotic will complete 03/04/2015  . Neuropathy     feet bilat     Past Surgical History  Procedure Laterality Date  . Foramen magnum ependymoma surgery  2003    Dr Rita Ohara  . Total knee arthroplasty      L 2008, R 2009- Dr Maureen Ralphs  . Joint replacement Bilateral   . Tonsillectomy    . Nasal sinus surgery      1973   . Total knee revision Right 03/05/2015    Procedure: RIGHT TOTAL KNEE ARTHROPLASTY REVISION;  Surgeon: Gaynelle Arabian, MD;  Location: [facility];  Service: Orthopedics;  Laterality: Right;    There were no vitals filed for this visit.  Visit Diagnosis:  Right knee pain  Decreased ROM of right knee  Knee swelling, right  Weakness of right leg      Subjective Assessment - 04/03/15 1419    Subjective Pain in thigh not hurting now   Currently in Pain? Yes   Pain Score 3    Pain Location Knee   Pain Descriptors / Indicators Aching  dead pain   Pain  Radiating Towards thigh   Aggravating Factors  Leg lifts                          [facility] Adult PT Treatment/Exercise - 04/03/15 1415    Knee/Hip Exercises: Supine   Quad Sets Right;2 sets  cues   Short Arc Quad Sets Right;3 sets;10 reps  4, 6.8.5 LBS   Heel Slides AROM;AAROM   Heel Slides Limitations 120    Patellar Mobs --  checked non tight.   Cryotherapy   Number Minutes Cryotherapy 10 Minutes   Cryotherapy Location Hip;Knee   Type of Cryotherapy --  cold pack   Manual Therapy   Manual Therapy --  retrograde soft tissue work starting proximal thigh to knee   Other Manual Therapy Kinesiotex taping for edema control, and quad activation Rt knee                PT Education - 04/03/15 1746    Education provided Yes   Education Details how to control edema   Person(s) Educated Patient   Methods Explanation   Comprehension  Verbalized understanding          PT Short Term Goals - 04/03/15 1750    PT SHORT TERM GOAL #1   Title pt will be I with basic HEP (04/14/2015)   Time 3   Period Weeks   Status On-going           PT Long Term Goals - 04/03/15 1751    PT LONG TERM GOAL #1   Title She will be I with Advanced HEP (05/05/2015)   Time 6   Period Weeks   Status On-going   PT LONG TERM GOAL #2   Title pt will increase R knee flexion AROM to >135 degrees and ext to 0 degrees to assist with an effiecent gait pattern (05/05/2015)   Baseline 120   Time 6   Period Weeks   Status On-going   PT LONG TERM GOAL #3   Title pt will increase R knee strength to greater >4+/5 to assist with endurance for prolonged walking/ stand to help with pt goal of walking through [place] (05/05/2015)   Time 6   Period Weeks   Status On-going   PT LONG TERM GOAL #4   Title pt will decrease her 10 MWT by >3 sec to assist with functional and safe gait (05/05/2015)   Time 6   Period Weeks   Status On-going   PT LONG TERM GOAL #5   Title she will be able to  verbalize and demonstrate techniques to reduce risk of R knee reinjury by postural awareness, proper lifting/carrying and gait mechanics, and HEP (05/05/2015)   Time 6   Period Weeks   Status On-going               Plan - 04/03/15 1748    Clinical Impression Statement ROM progress great, edema improving,  needed more work on quad control.  She thinks she "overdid" her home exercise program and made her hips sore.   PT Next Visit Plan ready for closed chain, quad control .  See PT plan        Problem List Patient Active Problem List   Diagnosis Date Noted  . 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 OA (osteoarthritis) of knee 03/05/2015  . Rib pain on left side 02/27/2015  . URI, acute 09/17/2013  . Swelling of limb-Bilateral leg Left > than right 07/11/2013  . Numbness-Left foot 07/11/2013  . Varicose veins of lower extremities with other complications [id number]  . Left hand pain 02/09/2013  . Right shoulder pain 09/12/2012  . Edema 04/24/2012  . Grief 11/10/2011  . ABSCESS, TOOTH 02/03/2011  . OSTEOPENIA 10/28/2010  . HYPERKALEMIA 07/15/2010  . ARTHRALGIA 04/08/2010  . FATIGUE 04/08/2010  . XEROSTOMIA 02/04/2010  . ECZEMA 10/29/2009  . TOBACCO USE, QUIT 10/29/2009  . Anemia 06/18/2009  . 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 Diarrhea 06/18/2009  . OPACITY, VITREOUS HUMOR 01/15/2009  . COLITIS 10/16/2008  . KNEE PAIN 04/10/2008  . HYPERCHOLESTEROLEMIA 01/10/2008  . ANXIETY 11/08/2007  . Depression 11/08/2007  . Osteoarthritis 11/08/2007  . LOW BACK PAIN 11/08/2007  . Diabetes mellitus type 2, controlled 10/07/2007  . Essential hypertension 10/07/2007  . GERD 10/07/2007  . MICROALBUMINURIA 10/07/2007   Melvenia Needles, PTA 04/03/2015 5:53 PM Phone: [phone] Fax: [phone]   [facility] 04/03/2015, 5:53 PM  [place] [facility] [address] [place], [place],  [address] Phone: [phone]   Fax:  [phone]

## 2015-04-03 NOTE — Patient Instructions (Signed)
Remove tape if irritating 

## 2015-04-04 ENCOUNTER — Ambulatory Visit: Payer: 59 | Admitting: Physical Therapy

## 2015-04-04 DIAGNOSIS — M25661 Stiffness of right knee, not elsewhere classified: Secondary | ICD-10-CM

## 2015-04-04 DIAGNOSIS — M25561 Pain in right knee: Secondary | ICD-10-CM

## 2015-04-04 DIAGNOSIS — R29898 Other symptoms and signs involving the musculoskeletal system: Secondary | ICD-10-CM

## 2015-04-04 DIAGNOSIS — M25461 Effusion, right knee: Secondary | ICD-10-CM

## 2015-04-04 DIAGNOSIS — R269 Unspecified abnormalities of gait and mobility: Secondary | ICD-10-CM

## 2015-04-04 NOTE — Therapy (Signed)
[place], [place], [address] Phone: [phone]   Fax:  [phone]  Physical Therapy Treatment  Patient Details  Name: [name] MRN: [id number] Date of Birth: 08-Aug-1951 Referring Provider:  Cassandria Anger, MD  Encounter Date: 04/04/2015      PT End of Session - 04/04/15 0753    Visit Number 3   Number of Visits 12   Date for PT Re-Evaluation 05/05/15   PT Start Time 0800   PT Stop Time 0900   PT Time Calculation (min) 60 min   Activity Tolerance Patient tolerated treatment well   Behavior During Therapy [facility] for tasks assessed/performed      Past Medical History  Diagnosis Date  . Diabetes mellitus     type II  . GERD (gastroesophageal reflux disease)   . Hypertension   . IBS (irritable bowel syndrome)   . Depression   . Anxiety   . LBP (low back pain)   . Osteoarthritis   . Colitis 2010    microscopic- Dr Henrene Pastor  . Renal insufficiency 2011  . Osteopenia   . Anemia   . Pneumonia     hx of 2014   . Headache   . Sinusitis     currently being treated with antibiotic will complete 03/04/2015  . Neuropathy     feet bilat     Past Surgical History  Procedure Laterality Date  . Foramen magnum ependymoma surgery  2003    Dr Rita Ohara  . Total knee arthroplasty      L 2008, R 2009- Dr Maureen Ralphs  . Joint replacement Bilateral   . Tonsillectomy    . Nasal sinus surgery      1973   . Total knee revision Right 03/05/2015    Procedure: RIGHT TOTAL KNEE ARTHROPLASTY REVISION;  Surgeon: Gaynelle Arabian, MD;  Location: [facility];  Service: Orthopedics;  Laterality: Right;    There were no vitals filed for this visit.  Visit Diagnosis:  Right knee pain  Decreased ROM of right knee  Knee swelling, right  Weakness of right leg  Knee stiffness, right  Abnormality of gait      Subjective Assessment - 04/04/15 0758    Subjective Patient reports that her R ankle is bothering her  today, stating yesterday it was her foot.    Currently in Pain? Yes   Pain Score 0-No pain   Pain Location Knee   Pain Orientation Right   Pain Descriptors / Indicators Aching   Pain Type Surgical pain   Pain Onset More than a month ago   Pain Frequency Constant   Multiple Pain Sites Yes   Pain Score 4   Pain Location Ankle   Pain Orientation Right   Pain Descriptors / Indicators Sharp   Pain Type Acute pain   Pain Onset Today   Pain Frequency Rarely            [facility] PT Assessment - 04/04/15 0001    AROM   Right Knee Extension -6   Right Knee Flexion 116  after mobs 122                     [facility] Adult PT Treatment/Exercise - 04/04/15 0802    Balance   Balance Assessed Yes   Knee/Hip Exercises: Aerobic   Stationary Bike bike L2 x 6 min  lowering seat at 3 minutes   Knee/Hip Exercises: Standing   Heel Raises 1  set;15 reps   Terminal Knee Extension AROM;Strengthening;Right;1 set;15 reps;Theraband   Theraband Level (Terminal Knee Extension) Level 3 (Green)   Lateral Step Up Both;1 set;15 reps   Forward Step Up Both;1 set;15 reps;Step Height: 4"   Step Down Right;1 set;10 reps;Step Height: 2"   Knee/Hip Exercises: Supine   Quad Sets AROM;Strengthening;Right;1 set;15 reps  3 sec hold   Short Arc Quad Sets AROM;Strengthening;Right;1 set;20 reps  2#   Straight Leg Raises AROM;Strengthening;Right;1 set;10 reps  with quad set   Knee/Hip Exercises: Prone   Other Prone Exercises knee bends with strap 5 x 10 sec   Cryotherapy   Number Minutes Cryotherapy 15 Minutes   Cryotherapy Location Hip;Knee   Type of Cryotherapy Other (comment)  vasopnuematic compression    Manual Therapy   Manual Therapy Joint mobilization   Joint Mobilization knee grade 2-3 A<>P mobilizations, Grade 2 patellar mobs in all directions                PT Education - 04/03/15 1746    Education provided Yes   Education Details how to control edema   Person(s) Educated  Patient   Methods Explanation   Comprehension Verbalized understanding          PT Short Term Goals - 04/03/15 1750    PT SHORT TERM GOAL #1   Title pt will be I with basic HEP (04/14/2015)   Time 3   Period Weeks   Status On-going           PT Long Term Goals - 04/03/15 1751    PT LONG TERM GOAL #1   Title She will be I with Advanced HEP (05/05/2015)   Time 6   Period Weeks   Status On-going   PT LONG TERM GOAL #2   Title pt will increase R knee flexion AROM to >135 degrees and ext to 0 degrees to assist with an effiecent gait pattern (05/05/2015)   Baseline 120   Time 6   Period Weeks   Status On-going   PT LONG TERM GOAL #3   Title pt will increase R knee strength to greater >4+/5 to assist with endurance for prolonged walking/ stand to help with pt goal of walking through [place] (05/05/2015)   Time 6   Period Weeks   Status On-going   PT LONG TERM GOAL #4   Title pt will decrease her 10 MWT by >3 sec to assist with functional and safe gait (05/05/2015)   Time 6   Period Weeks   Status On-going   PT LONG TERM GOAL #5   Title she will be able to verbalize and demonstrate techniques to reduce risk of R knee reinjury by postural awareness, proper lifting/carrying and gait mechanics, and HEP (05/05/2015)   Time 6   Period Weeks   Status On-going               Plan - 04/04/15 [id number]    Clinical Impression Statement [name] continues to make great progress with AROM of the R knee with no pain. She tolerated treatment well today but demonstrated increased difficulty during step down exercises from 2 inch step stating it was "weird/difficult." She continues to demonstrate an antalgic circumducted gait pattern plan to encorporate gait training to promote efficency.    PT Next Visit Plan Step ups, balance, RLE strengthening/ stretching, edema control, gait training. modalities for pain PRN   Consulted and Agree with Plan of Care Patient  Problem  List Patient Active Problem List   Diagnosis Date Noted  . 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 OA (osteoarthritis) of knee 03/05/2015  . Rib pain on left side 02/27/2015  . URI, acute 09/17/2013  . Swelling of limb-Bilateral leg Left > than right 07/11/2013  . Numbness-Left foot 07/11/2013  . Varicose veins of lower extremities with other complications [id number]  . Left hand pain 02/09/2013  . Right shoulder pain 09/12/2012  . Edema 04/24/2012  . Grief 11/10/2011  . ABSCESS, TOOTH 02/03/2011  . OSTEOPENIA 10/28/2010  . HYPERKALEMIA 07/15/2010  . ARTHRALGIA 04/08/2010  . FATIGUE 04/08/2010  . XEROSTOMIA 02/04/2010  . ECZEMA 10/29/2009  . TOBACCO USE, QUIT 10/29/2009  . Anemia 06/18/2009  . 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 Diarrhea 06/18/2009  . OPACITY, VITREOUS HUMOR 01/15/2009  . COLITIS 10/16/2008  . KNEE PAIN 04/10/2008  . HYPERCHOLESTEROLEMIA 01/10/2008  . ANXIETY 11/08/2007  . Depression 11/08/2007  . Osteoarthritis 11/08/2007  . LOW BACK PAIN 11/08/2007  . Diabetes mellitus type 2, controlled 10/07/2007  . Essential hypertension 10/07/2007  . GERD 10/07/2007  . MICROALBUMINURIA 10/07/2007   Starr Lake PT, DPT, LAT, ATC  04/04/2015  9:27 AM   [facility] [address] [place], [place], [address] Phone: [phone]   Fax:  [phone]

## 2015-04-09 ENCOUNTER — Ambulatory Visit: Payer: 59 | Admitting: Physical Therapy

## 2015-04-09 DIAGNOSIS — M25461 Effusion, right knee: Secondary | ICD-10-CM

## 2015-04-09 DIAGNOSIS — M25661 Stiffness of right knee, not elsewhere classified: Secondary | ICD-10-CM

## 2015-04-09 DIAGNOSIS — R29898 Other symptoms and signs involving the musculoskeletal system: Secondary | ICD-10-CM

## 2015-04-09 DIAGNOSIS — M25561 Pain in right knee: Secondary | ICD-10-CM | POA: Diagnosis not present

## 2015-04-09 DIAGNOSIS — R269 Unspecified abnormalities of gait and mobility: Secondary | ICD-10-CM

## 2015-04-09 NOTE — Therapy (Signed)
[place], [place], [address] Phone: [phone]   Fax:  [phone]  Physical Therapy Treatment  Patient Details  Name: [name] MRN: [id number] Date of Birth: [birth year] Referring Provider:  Cassandria Anger, MD  Encounter Date: 04/09/2015      PT End of Session - 04/09/15 1425    Visit Number 4   Number of Visits 12   Date for PT Re-Evaluation 05/05/15   PT Start Time [id number]   PT Stop Time 1415   PT Time Calculation (min) 33 min   Activity Tolerance Patient tolerated treatment well   Behavior During Therapy [facility] for tasks assessed/performed      Past Medical History  Diagnosis Date  . Diabetes mellitus     type II  . GERD (gastroesophageal reflux disease)   . Hypertension   . IBS (irritable bowel syndrome)   . Depression   . Anxiety   . LBP (low back pain)   . Osteoarthritis   . Colitis 2010    microscopic- Dr Henrene Pastor  . Renal insufficiency 2011  . Osteopenia   . Anemia   . Pneumonia     hx of 2014   . Headache   . Sinusitis     currently being treated with antibiotic will complete 03/04/2015  . Neuropathy     feet bilat     Past Surgical History  Procedure Laterality Date  . Foramen magnum ependymoma surgery  2003    Dr Rita Ohara  . Total knee arthroplasty      L 2008, R 2009- Dr Maureen Ralphs  . Joint replacement Bilateral   . Tonsillectomy    . Nasal sinus surgery      1973   . Total knee revision Right 03/05/2015    Procedure: RIGHT TOTAL KNEE ARTHROPLASTY REVISION;  Surgeon: Gaynelle Arabian, MD;  Location: [facility];  Service: Orthopedics;  Laterality: Right;    There were no vitals filed for this visit.  Visit Diagnosis:  Right knee pain  Decreased ROM of right knee  Knee swelling, right  Weakness of right leg  Knee stiffness, right  Abnormality of gait      Subjective Assessment - 04/09/15 1343    Subjective pt reports that she has been doing well since the last  visit. She reports that she saw Dr. Wynelle Link yesterday stating that she was doing well.    Currently in Pain? Yes   Pain Score 1    Pain Location Knee   Pain Orientation Right   Pain Descriptors / Indicators Aching                         [facility] Adult PT Treatment/Exercise - 04/09/15 1346    Ambulation/Gait   Ambulation/Gait Yes   Gait Comments 1 x 60 ft @ 60 BPM, 1 x 60 ft 75 BPM  continues to demonstrate circumducted gait   Knee/Hip Exercises: Stretches   Passive Hamstring Stretch 2 reps;30 seconds   Hip Flexor Stretch 2 reps;30 seconds   Knee/Hip Exercises: Aerobic   Stationary Bike Nu Step L4 x 8 min   Knee/Hip Exercises: Machines for Strengthening   Total Gym Leg Press 2 x 10 20#  with eccentric control   Knee/Hip Exercises: Standing   Heel Raises 1 set;15 reps   Forward Step Up Both;15 reps;Step Height: 4";Step Height: 6";2 sets   Knee/Hip Exercises: Supine   Straight Leg Raises AROM;Strengthening;Right;10 reps;2 sets  2#                PT Education - 04/09/15 1424    Education provided Yes   Education Details POC to focus on advances strengtheing of hip and knee, gait and balance   Person(s) Educated Patient   Methods Explanation   Comprehension Verbalized understanding          PT Short Term Goals - 04/03/15 1750    PT SHORT TERM GOAL #1   Title pt will be I with basic HEP (04/14/2015)   Time 3   Period Weeks   Status On-going           PT Long Term Goals - 04/03/15 1751    PT LONG TERM GOAL #1   Title She will be I with Advanced HEP (05/05/2015)   Time 6   Period Weeks   Status On-going   PT LONG TERM GOAL #2   Title pt will increase R knee flexion AROM to >135 degrees and ext to 0 degrees to assist with an effiecent gait pattern (05/05/2015)   Baseline 120   Time 6   Period Weeks   Status On-going   PT LONG TERM GOAL #3   Title pt will increase R knee strength to greater >4+/5 to assist with endurance for prolonged  walking/ stand to help with pt goal of walking through [place] (05/05/2015)   Time 6   Period Weeks   Status On-going   PT LONG TERM GOAL #4   Title pt will decrease her 10 MWT by >3 sec to assist with functional and safe gait (05/05/2015)   Time 6   Period Weeks   Status On-going   PT LONG TERM GOAL #5   Title she will be able to verbalize and demonstrate techniques to reduce risk of R knee reinjury by postural awareness, proper lifting/carrying and gait mechanics, and HEP (05/05/2015)   Time 6   Period Weeks   Status On-going               Plan - 04/09/15 1425    Clinical Impression Statement pt presents to therapy today 12 minutes Late. She reports seeing Dr Wynelle Link stating that he is pleased with the progress she has made. Plan to focus treatment on gait training and balance. She demonstrates a circumducted gait pattern to compensate for R glute med/max weakness with R lateral trunk lean to assist with keeping the pelvis level during ambulation.  She tolerated treatment and exercise well .   PT Next Visit Plan Step ups, balance, RLE strengthening/ stretching, edema control, gait training. modalities for pain PRN, R hip strengthening.   PT Home Exercise Plan clams, sit to stand, standing hip ext   Consulted and Agree with Plan of Care Patient        Problem List Patient Active Problem List   Diagnosis Date Noted  . 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 OA (osteoarthritis) of knee 03/05/2015  . Rib pain on left side 02/27/2015  . URI, acute 09/17/2013  . Swelling of limb-Bilateral leg Left > than right 07/11/2013  . Numbness-Left foot 07/11/2013  . Varicose veins of lower extremities with other complications [id number]  . Left hand pain 02/09/2013  . Right shoulder pain 09/12/2012  . Edema 04/24/2012  . Grief 11/10/2011  . ABSCESS, TOOTH 02/03/2011  . OSTEOPENIA 10/28/2010  . HYPERKALEMIA 07/15/2010  . ARTHRALGIA 04/08/2010  . FATIGUE 04/08/2010   . XEROSTOMIA 02/04/2010  . ECZEMA  10/29/2009  . TOBACCO USE, QUIT 10/29/2009  . Anemia 06/18/2009  . 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 Diarrhea 06/18/2009  . OPACITY, VITREOUS HUMOR 01/15/2009  . COLITIS 10/16/2008  . KNEE PAIN 04/10/2008  . HYPERCHOLESTEROLEMIA 01/10/2008  . ANXIETY 11/08/2007  . Depression 11/08/2007  . Osteoarthritis 11/08/2007  . LOW BACK PAIN 11/08/2007  . Diabetes mellitus type 2, controlled 10/07/2007  . Essential hypertension 10/07/2007  . GERD 10/07/2007  . MICROALBUMINURIA 10/07/2007   Starr Lake PT, DPT, LAT, ATC  04/09/2015  2:31 PM      [facility] [address] [place], [place], [address] Phone: [phone]   Fax:  [phone]

## 2015-04-09 NOTE — Patient Instructions (Signed)
   Kristoffer Leamon PT, DPT, LAT, ATC  [facility] Phone: [phone]     

## 2015-04-11 ENCOUNTER — Ambulatory Visit: Payer: 59 | Admitting: Physical Therapy

## 2015-04-11 DIAGNOSIS — M25561 Pain in right knee: Secondary | ICD-10-CM

## 2015-04-11 DIAGNOSIS — R29898 Other symptoms and signs involving the musculoskeletal system: Secondary | ICD-10-CM

## 2015-04-11 DIAGNOSIS — M25461 Effusion, right knee: Secondary | ICD-10-CM

## 2015-04-11 DIAGNOSIS — M25661 Stiffness of right knee, not elsewhere classified: Secondary | ICD-10-CM

## 2015-04-11 DIAGNOSIS — R269 Unspecified abnormalities of gait and mobility: Secondary | ICD-10-CM

## 2015-04-11 NOTE — Therapy (Signed)
[place], [place], [address] Phone: [phone]   Fax:  [phone]  Physical Therapy Treatment  Patient Details  Name: [name] MRN: [id number] Date of Birth: 09-06-1951 Referring Provider:  Cassandria Anger, MD  Encounter Date: 04/11/2015      PT End of Session - 04/11/15 1148    Visit Number 5   Number of Visits 12   Date for PT Re-Evaluation 05/05/15   PT Start Time 1100   PT Stop Time 1158   PT Time Calculation (min) 58 min   Activity Tolerance Patient tolerated treatment well   Behavior During Therapy [facility] for tasks assessed/performed      Past Medical History  Diagnosis Date  . Diabetes mellitus     type II  . GERD (gastroesophageal reflux disease)   . Hypertension   . IBS (irritable bowel syndrome)   . Depression   . Anxiety   . LBP (low back pain)   . Osteoarthritis   . Colitis 2010    microscopic- Dr Henrene Pastor  . Renal insufficiency 2011  . Osteopenia   . Anemia   . Pneumonia     hx of 2014   . Headache   . Sinusitis     currently being treated with antibiotic will complete 03/04/2015  . Neuropathy     feet bilat     Past Surgical History  Procedure Laterality Date  . Foramen magnum ependymoma surgery  2003    Dr Rita Ohara  . Total knee arthroplasty      L 2008, R 2009- Dr Maureen Ralphs  . Joint replacement Bilateral   . Tonsillectomy    . Nasal sinus surgery      1973   . Total knee revision Right 03/05/2015    Procedure: RIGHT TOTAL KNEE ARTHROPLASTY REVISION;  Surgeon: Gaynelle Arabian, MD;  Location: [facility];  Service: Orthopedics;  Laterality: Right;    There were no vitals filed for this visit.  Visit Diagnosis:  Right knee pain  Decreased ROM of right knee  Knee swelling, right  Weakness of right leg  Knee stiffness, right  Abnormality of gait      Subjective Assessment - 04/11/15 1109    Subjective pt presents to therapy today stating that she feels  more sore in the muscles of the LE than her knee, but reports that it isn't badn.    Currently in Pain? Yes   Pain Score 0-No pain   Pain Location Knee   Pain Orientation Right                         [facility] Adult PT Treatment/Exercise - 04/11/15 1110    Ambulation/Gait   Stairs Yes   Stairs Assistance 5: Supervision   Stair Management Technique Two rails   Number of Stairs 24   Height of Stairs 4   Gait Comments 2 x 100 ft with focus on normal gait patter  VC to decrease hip circumduction, and heel to toe gait.   Knee/Hip Exercises: Stretches   Passive Hamstring Stretch 2 reps;30 seconds   Hip Flexor Stretch 2 reps;30 seconds   Knee/Hip Exercises: Aerobic   Stationary Bike bike L 3 x 6 min   Knee/Hip Exercises: Machines for Strengthening   Cybex Knee Extension 2 x 10 15#  VC for eccentric control    Total Gym Leg Press 2 x 10 20#   Knee/Hip Exercises: Standing  Heel Raises 1 set;15 reps   Lateral Step Up Both;1 set;15 reps   Forward Step Up Both;15 reps;Step Height: 4";Step Height: 6";2 sets   Step Down Right;1 set;10 reps;Step Height: 2"   Other Standing Knee Exercises abduction 2 x 15 bil  VC to keep toes pointed in   Knee/Hip Exercises: Supine   Bridges AROM;Strengthening;Both;1 set;10 reps   Straight Leg Raises AROM;Strengthening;Right;10 reps;2 sets                PT Education - 04/11/15 1147    Education provided Yes   Education Details correcting ambulation with focus of decreasing trunk lean for glute med weakness.    Person(s) Educated Patient   Methods Explanation   Comprehension Verbalized understanding          PT Short Term Goals - 04/03/15 1750    PT SHORT TERM GOAL #1   Title pt will be I with basic HEP (04/14/2015)   Time 3   Period Weeks   Status On-going           PT Long Term Goals - 04/03/15 1751    PT LONG TERM GOAL #1   Title She will be I with Advanced HEP (05/05/2015)   Time 6   Period Weeks   Status  On-going   PT LONG TERM GOAL #2   Title pt will increase R knee flexion AROM to >135 degrees and ext to 0 degrees to assist with an effiecent gait pattern (05/05/2015)   Baseline 120   Time 6   Period Weeks   Status On-going   PT LONG TERM GOAL #3   Title pt will increase R knee strength to greater >4+/5 to assist with endurance for prolonged walking/ stand to help with pt goal of walking through [place] (05/05/2015)   Time 6   Period Weeks   Status On-going   PT LONG TERM GOAL #4   Title pt will decrease her 10 MWT by >3 sec to assist with functional and safe gait (05/05/2015)   Time 6   Period Weeks   Status On-going   PT LONG TERM GOAL #5   Title she will be able to verbalize and demonstrate techniques to reduce risk of R knee reinjury by postural awareness, proper lifting/carrying and gait mechanics, and HEP (05/05/2015)   Time 6   Period Weeks   Status On-going               Plan - 04/11/15 1148    Clinical Impression Statement Rhaegan presents with mild soreness in the L gluteal musculature due to weakness.  she was able to tolerate all exercises well today with complaint of muscle tightness in the hip and diffiuclty peforming step downs. She continues to demonstrate waddling gait with hip circumduction. Practiced ambulation with heel to toe gait pattern and forward hip flexion pt tolerated that well with complaint that it felt "different". plan to continue to treatment as tolerated.    PT Next Visit Plan Step ups, balance, RLE strengthening/ stretching, edema control, gait training. modalities for pain PRN, R hip strengthening.   PT Home Exercise Plan ambulating with heel to toe pattern with smaller steps         Problem List Patient Active Problem List   Diagnosis Date Noted  . 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 OA (osteoarthritis) of knee 03/05/2015  . Rib pain on left side 02/27/2015  . URI, acute 09/17/2013  . Swelling of limb-Bilateral leg  Left > than right 07/11/2013  . Numbness-Left foot 07/11/2013  . Varicose veins of lower extremities with other complications [id number]  . Left hand pain 02/09/2013  . Right shoulder pain 09/12/2012  . Edema 04/24/2012  . Grief 11/10/2011  . ABSCESS, TOOTH 02/03/2011  . OSTEOPENIA 10/28/2010  . HYPERKALEMIA 07/15/2010  . ARTHRALGIA 04/08/2010  . FATIGUE 04/08/2010  . XEROSTOMIA 02/04/2010  . ECZEMA 10/29/2009  . TOBACCO USE, QUIT 10/29/2009  . Anemia 06/18/2009  . 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 Diarrhea 06/18/2009  . OPACITY, VITREOUS HUMOR 01/15/2009  . COLITIS 10/16/2008  . KNEE PAIN 04/10/2008  . HYPERCHOLESTEROLEMIA 01/10/2008  . ANXIETY 11/08/2007  . Depression 11/08/2007  . Osteoarthritis 11/08/2007  . LOW BACK PAIN 11/08/2007  . Diabetes mellitus type 2, controlled 10/07/2007  . Essential hypertension 10/07/2007  . GERD 10/07/2007  . MICROALBUMINURIA 10/07/2007   Starr Lake PT, DPT, LAT, ATC  04/11/2015  12:02 PM   [facility] [address] [place], [place], [address] Phone: [phone]   Fax:  [phone]

## 2015-04-13 LAB — HM DIABETES EYE EXAM

## 2015-04-14 ENCOUNTER — Ambulatory Visit: Payer: 59 | Attending: Orthopedic Surgery | Admitting: Physical Therapy

## 2015-04-14 DIAGNOSIS — R29898 Other symptoms and signs involving the musculoskeletal system: Secondary | ICD-10-CM | POA: Insufficient documentation

## 2015-04-14 DIAGNOSIS — M25561 Pain in right knee: Secondary | ICD-10-CM | POA: Diagnosis present

## 2015-04-14 DIAGNOSIS — R269 Unspecified abnormalities of gait and mobility: Secondary | ICD-10-CM

## 2015-04-14 DIAGNOSIS — M25461 Effusion, right knee: Secondary | ICD-10-CM | POA: Insufficient documentation

## 2015-04-14 DIAGNOSIS — M25661 Stiffness of right knee, not elsewhere classified: Secondary | ICD-10-CM | POA: Insufficient documentation

## 2015-04-14 NOTE — Therapy (Signed)
[facility] [address] [place], [place], [address] Phone: [phone]   Fax:  [phone]  Physical Therapy Treatment  Patient Details  Name: [name] MRN: [id number] Date of Birth: 1951-08-19 Referring Provider:  Tresa Garter, MD  Encounter Date: 04/14/2015      PT End of Session - 04/14/15 1558    Visit Number 6   Number of Visits 12   Date for PT Re-Evaluation 05/05/15   PT Start Time 1417   PT Stop Time 1504   PT Time Calculation (min) 47 min   Activity Tolerance Patient tolerated treatment well      Past Medical History  Diagnosis Date  . Diabetes mellitus     type II  . GERD (gastroesophageal reflux disease)   . Hypertension   . IBS (irritable bowel syndrome)   . Depression   . Anxiety   . LBP (low back pain)   . Osteoarthritis   . Colitis 2010    microscopic- Dr Marina Goodell  . Renal insufficiency 2011  . Osteopenia   . Anemia   . Pneumonia     hx of 2014   . Headache   . Sinusitis     currently being treated with antibiotic will complete 03/04/2015  . Neuropathy     feet bilat     Past Surgical History  Procedure Laterality Date  . Foramen magnum ependymoma surgery  2003    Dr Jule Ser  . Total knee arthroplasty      L 2008, R 2009- Dr Despina Hick  . Joint replacement Bilateral   . Tonsillectomy    . Nasal sinus surgery      1973   . Total knee revision Right 03/05/2015    Procedure: RIGHT TOTAL KNEE ARTHROPLASTY REVISION;  Surgeon: Ollen Gross, MD;  Location: [facility];  Service: Orthopedics;  Laterality: Right;    There were no vitals filed for this visit.  Visit Diagnosis:  Weakness of right leg  Abnormality of gait      Subjective Assessment - 04/14/15 1435    Subjective wants to not walk like a duck.  No pain she is premedicated for pain.  Doing her exercises at home   Currently in Pain? No/denies   Pain Orientation Right   Pain Descriptors / Indicators --  tight   Multiple  Pain Sites No                         [facility] Adult PT Treatment/Exercise - 04/14/15 1430    High Level Balance   High Level Balance Activities --  multiple,  taske close stand by assist.   Knee/Hip Exercises: Standing   Heel Raises --  2 to lift 1 to lower, this is difficult   Lateral Step Up 10 reps  2 hands needed on steps   Forward Step Up Limitations needs 2 handrails   Step Down Limitations needs 2 handrails, cues   SLS with Vectors 3 way close SBA  5 to 10 reps, single leg with Lt moving pillow case on floor   Other Standing Knee Exercises sit to stand 10 reps  ,0 hands   Other Standing Knee Exercises hip extension 20 reps both      gait training, multiple cues,  Increased heel toe gait noted with less lateral sway. Post instruction.          PT Education - 04/14/15 1556    Education provided Yes  Education Details gait training   Person(s) Educated Patient   Methods Explanation;Demonstration;Tactile cues;Verbal cues   Comprehension Verbalized understanding;Need further instruction;Returned demonstration          PT Short Term Goals - 04/14/15 1602    PT SHORT TERM GOAL #1   Title pt will be I with basic HEP (04/14/2015)   Time 3   Period Weeks   Status Achieved           PT Long Term Goals - 04/14/15 1602    PT LONG TERM GOAL #1   Title She will be I with Advanced HEP (05/05/2015)   Time 6   Period Weeks   Status On-going   PT LONG TERM GOAL #2   Title pt will increase R knee flexion AROM to >135 degrees and ext to 0 degrees to assist with an effiecent gait pattern (05/05/2015)   Time 6   Period Weeks   Status Unable to assess   PT LONG TERM GOAL #3   Title pt will increase R knee strength to greater >4+/5 to assist with endurance for prolonged walking/ stand to help with pt goal of walking through [place] (05/05/2015)   Time 6   Period Weeks   Status On-going   PT LONG TERM GOAL #4   Title pt will decrease her 10 MWT by  >3 sec to assist with functional and safe gait (05/05/2015)   Time 6   Period Weeks   Status Unable to assess   PT LONG TERM GOAL #5   Title she will be able to verbalize and demonstrate techniques to reduce risk of R knee reinjury by postural awareness, proper lifting/carrying and gait mechanics, and HEP (05/05/2015)   Time 6   Period Weeks   Status Unable to assess               Plan - 04/14/15 1559    Clinical Impression Statement gait improved some with "Yoga" cues, (after many pregait, gait techniques)  Balance work continued with close SBA needed.  No new goals met but progress noted in the time she can tolerate exercises.        Problem List Patient Active Problem List   Diagnosis Date Noted  . 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 OA (osteoarthritis) of knee 03/05/2015  . Rib pain on left side 02/27/2015  . URI, acute 09/17/2013  . Swelling of limb-Bilateral leg Left > than right 07/11/2013  . Numbness-Left foot 07/11/2013  . Varicose veins of lower extremities with other complications 51/09/2110  . Left hand pain 02/09/2013  . Right shoulder pain 09/12/2012  . Edema 04/24/2012  . Grief 11/10/2011  . ABSCESS, TOOTH 02/03/2011  . OSTEOPENIA 10/28/2010  . HYPERKALEMIA 07/15/2010  . ARTHRALGIA 04/08/2010  . FATIGUE 04/08/2010  . XEROSTOMIA 02/04/2010  . ECZEMA 10/29/2009  . TOBACCO USE, QUIT 10/29/2009  . Anemia 06/18/2009  . 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 Diarrhea 06/18/2009  . OPACITY, VITREOUS HUMOR 01/15/2009  . COLITIS 10/16/2008  . KNEE PAIN 04/10/2008  . HYPERCHOLESTEROLEMIA 01/10/2008  . ANXIETY 11/08/2007  . Depression 11/08/2007  . Osteoarthritis 11/08/2007  . LOW BACK PAIN 11/08/2007  . Diabetes mellitus type 2, controlled 10/07/2007  . Essential hypertension 10/07/2007  . GERD 10/07/2007  . MICROALBUMINURIA 10/07/2007    HARRIS,KAREN 04/14/2015, 4:06 PM  [facility] [address] [place], [place], [address] Phone: [phone]   Fax:  [phone]   Santiago Glad  Kenton Kingfisher, PTA 04/14/2015 4:06 PM Phone: [phone] Fax: [phone]

## 2015-04-15 ENCOUNTER — Ambulatory Visit (INDEPENDENT_AMBULATORY_CARE_PROVIDER_SITE_OTHER): Payer: Self-pay | Admitting: Family Medicine

## 2015-04-15 ENCOUNTER — Ambulatory Visit: Payer: Self-pay | Admitting: Pharmacist

## 2015-04-15 VITALS — BP 118/82 | Ht 63.0 in | Wt 231.0 lb

## 2015-04-15 DIAGNOSIS — E119 Type 2 diabetes mellitus without complications: Secondary | ICD-10-CM

## 2015-04-15 NOTE — Patient Instructions (Addendum)
1)  Continue PT twice per week and focus on continuing exercises after PT  2)  Continue to monitor blood glucose at least once daily 3)  Attempt to make healthy dietary choices 4)  Schedule a yearly eye exam 5)  Follow-up in 3 months on Tuesday August 9th @ 1:30 am  Great to see you today!

## 2015-04-15 NOTE — Progress Notes (Signed)
Subjective:  Patient is a 64 yo female with type 2 diabetes who presents today for 3 month follow-up as part of the employer-sponsored [employer] [facility] program. Current diabetes regimen includes glimepiride 1 mg daily, pioglitazone 15 mg daily, and metformin 1000 mg twice daily. Patient does not take ASA, ACEi, or statin at this time. Most recent MD follow-up was March 2016. Patient has a pending appt for June. Of note, patient recently underwent total knee replacement and is undergoing PT and is recovering well.    Diabetes Assessment:  Pioglitazone was added at most recent MD appt, patient is pleased with this new addition.  Patient does maintain good medication compliance. Most recent A1c was 7.4% (March) which is exceeding goal of less than 7%.  Patient did not bring meter today but is currently testing daily and more if symptomatic. Glucose monitoring occurs fasting and as needed.  Hypoglycemia is rare. Patient does demonstrate appropriate correction of hypoglycemia.  Per patient report glucose averages 90-160s.  Patient reports signs and symptoms of neuropathy in feet and toes including numbness/tingling/burning but no signs of infection at this time. Patient is due for eye but is up to date on dental exam.      Lifestyle Assessment:  Diet - No major changes, patient feels that she eats less since she is no longer working.  She will continue to strive for a healthy, balanced diet.   Exercise - patient is walking in building, in shopping centers and [employer].  Of note, exercise is limited at this time due to recent knee replacement.  She is still attending PT twice weekly and I have encouraged her to continue PT exercises even after graduating from PT.     Plan and Goals:  1)  Continue PT twice per week and focus on continuing exercises after PT  2)  Continue to monitor blood glucose at least once daily 3)  Attempt to make healthy dietary choices 4)  Schedule a yearly eye exam 5)  Follow-up in  3 months on Tuesday August 9th @ 1:30 am   Tilman Neat, PharmD [facility]  [phone]

## 2015-04-16 ENCOUNTER — Ambulatory Visit: Payer: 59 | Admitting: Physical Therapy

## 2015-04-16 DIAGNOSIS — R29898 Other symptoms and signs involving the musculoskeletal system: Secondary | ICD-10-CM

## 2015-04-16 DIAGNOSIS — M25561 Pain in right knee: Secondary | ICD-10-CM | POA: Diagnosis not present

## 2015-04-16 NOTE — Therapy (Signed)
[place], [place], [address] Phone: [phone]   Fax:  [phone]  Physical Therapy Treatment  Patient Details  Name: [name] MRN: [id number] Date of Birth: 28-Jul-1951 Referring Provider:  Cassandria Anger, MD  Encounter Date: 04/16/2015      PT End of Session - 04/16/15 1742    Visit Number 7   Number of Visits 12   Date for PT Re-Evaluation 05/05/15   PT Start Time [id number]   PT Stop Time 1445   PT Time Calculation (min) 30 min   Activity Tolerance Patient tolerated treatment well      Past Medical History  Diagnosis Date  . Diabetes mellitus     type II  . GERD (gastroesophageal reflux disease)   . Hypertension   . IBS (irritable bowel syndrome)   . Depression   . Anxiety   . LBP (low back pain)   . Osteoarthritis   . Colitis 2010    microscopic- Dr Henrene Pastor  . Renal insufficiency 2011  . Osteopenia   . Anemia   . Pneumonia     hx of 2014   . Headache   . Sinusitis     currently being treated with antibiotic will complete 03/04/2015  . Neuropathy     feet bilat     Past Surgical History  Procedure Laterality Date  . Foramen magnum ependymoma surgery  2003    Dr Rita Ohara  . Total knee arthroplasty      L 2008, R 2009- Dr Maureen Ralphs  . Joint replacement Bilateral   . Tonsillectomy    . Nasal sinus surgery      1973   . Total knee revision Right 03/05/2015    Procedure: RIGHT TOTAL KNEE ARTHROPLASTY REVISION;  Surgeon: Gaynelle Arabian, MD;  Location: [facility];  Service: Orthopedics;  Laterality: Right;    There were no vitals filed for this visit.  Visit Diagnosis:  Weakness of right leg      Subjective Assessment - 04/16/15 1419    Subjective 1/10 low back pain, uncomfortable .  Was sore a little after last session , but it was OK, No knee pain   Currently in Pain? Yes   Pain Score 1    Pain Location Back   Pain Orientation Lower   Pain Descriptors / Indicators --   uncomfortable   Pain Type Chronic pain   Pain Radiating Towards mid glutes   Pain Frequency Intermittent   Aggravating Factors  Possible change in gait pattern   Effect of Pain on Daily Activities sitting    Multiple Pain Sites No                         [facility] Adult PT Treatment/Exercise - 04/16/15 1423    High Level Balance   High Level Balance Activities --  narrowed base of support, dynamic activities, close stand by   Knee/Hip Exercises: Aerobic   Stationary Bike Nustep Level 4 changed to Level 6.   Knee/Hip Exercises: Standing   Heel Raises 20 reps  Rt unable to lift up more then 1/2 inch.   Knee Flexion Right   Knee Flexion Limitations 2 plates Rt only, 3 plates both   Side Lunges --  1 lap around mat, cues   Lateral Step Up 20 reps   Forward Step Up 20 reps   Step Down 15 reps;Hand Hold: 2;Step Height: 4"  PT Short Term Goals - 04/14/15 1602    PT SHORT TERM GOAL #1   Title pt will be I with basic HEP (04/14/2015)   Time 3   Period Weeks   Status Achieved           PT Long Term Goals - 04/14/15 1602    PT LONG TERM GOAL #1   Title She will be I with Advanced HEP (05/05/2015)   Time 6   Period Weeks   Status On-going   PT LONG TERM GOAL #2   Title pt will increase R knee flexion AROM to >135 degrees and ext to 0 degrees to assist with an effiecent gait pattern (05/05/2015)   Time 6   Period Weeks   Status Unable to assess   PT LONG TERM GOAL #3   Title pt will increase R knee strength to greater >4+/5 to assist with endurance for prolonged walking/ stand to help with pt goal of walking through [place] (05/05/2015)   Time 6   Period Weeks   Status On-going   PT LONG TERM GOAL #4   Title pt will decrease her 10 MWT by >3 sec to assist with functional and safe gait (05/05/2015)   Time 6   Period Weeks   Status Unable to assess   PT LONG TERM GOAL #5   Title she will be able to verbalize and demonstrate  techniques to reduce risk of R knee reinjury by postural awareness, proper lifting/carrying and gait mechanics, and HEP (05/05/2015)   Time 6   Period Weeks   Status Unable to assess               Plan - 04/16/15 1743    Clinical Impression Statement strength abd balance focus, sore back and hips probably from gait changes, strengthening focus.   PT Next Visit Plan Step ups, balance, RLE strengthening/ stretching,, R hip strengthening.   Consulted and Agree with Plan of Care Patient        Problem List Patient Active Problem List   Diagnosis Date Noted  . 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 OA (osteoarthritis) of knee 03/05/2015  . Rib pain on left side 02/27/2015  . URI, acute 09/17/2013  . Swelling of limb-Bilateral leg Left > than right 07/11/2013  . Numbness-Left foot 07/11/2013  . Varicose veins of lower extremities with other complications [id number]  . Left hand pain 02/09/2013  . Right shoulder pain 09/12/2012  . Edema 04/24/2012  . Grief 11/10/2011  . ABSCESS, TOOTH 02/03/2011  . OSTEOPENIA 10/28/2010  . HYPERKALEMIA 07/15/2010  . ARTHRALGIA 04/08/2010  . FATIGUE 04/08/2010  . XEROSTOMIA 02/04/2010  . ECZEMA 10/29/2009  . TOBACCO USE, QUIT 10/29/2009  . Anemia 06/18/2009  . 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 Diarrhea 06/18/2009  . OPACITY, VITREOUS HUMOR 01/15/2009  . COLITIS 10/16/2008  . KNEE PAIN 04/10/2008  . HYPERCHOLESTEROLEMIA 01/10/2008  . ANXIETY 11/08/2007  . Depression 11/08/2007  . Osteoarthritis 11/08/2007  . LOW BACK PAIN 11/08/2007  . Diabetes mellitus type 2, controlled 10/07/2007  . Essential hypertension 10/07/2007  . GERD 10/07/2007  . MICROALBUMINURIA 10/07/2007    HARRIS,KAREN 04/16/2015, 5:45 PM  [facility] [address] [place], [place], [address] Phone: [phone]   Fax:  [phone]   Melvenia Needles, PTA 04/16/2015 5:45  PM Phone: [phone] Fax: [phone]

## 2015-04-21 ENCOUNTER — Other Ambulatory Visit: Payer: Self-pay | Admitting: Internal Medicine

## 2015-04-21 NOTE — Telephone Encounter (Signed)
Ok to Rf in PCP's absence? Thanks! 

## 2015-04-22 ENCOUNTER — Ambulatory Visit: Payer: 59 | Admitting: Physical Therapy

## 2015-04-22 DIAGNOSIS — R29898 Other symptoms and signs involving the musculoskeletal system: Secondary | ICD-10-CM

## 2015-04-22 DIAGNOSIS — M25561 Pain in right knee: Secondary | ICD-10-CM | POA: Diagnosis not present

## 2015-04-22 DIAGNOSIS — M25661 Stiffness of right knee, not elsewhere classified: Secondary | ICD-10-CM

## 2015-04-22 NOTE — Therapy (Signed)
[place], [place], [address] Phone: [phone]   Fax:  [phone]  Physical Therapy Treatment  Patient Details  Name: [name] MRN: [id number] Date of Birth: February 05, 1951 Referring Provider:  Cassandria Anger, MD  Encounter Date: 04/22/2015      PT End of Session - 04/22/15 1514    Visit Number 8   Number of Visits 12   Date for PT Re-Evaluation 05/05/15   PT Start Time 1330   PT Stop Time 1416   PT Time Calculation (min) 46 min   Activity Tolerance Patient tolerated treatment well      Past Medical History  Diagnosis Date  . Diabetes mellitus     type II  . GERD (gastroesophageal reflux disease)   . Hypertension   . IBS (irritable bowel syndrome)   . Depression   . Anxiety   . LBP (low back pain)   . Osteoarthritis   . Colitis 2010    microscopic- Dr Henrene Pastor  . Renal insufficiency 2011  . Osteopenia   . Anemia   . Pneumonia     hx of 2014   . Headache   . Sinusitis     currently being treated with antibiotic will complete 03/04/2015  . Neuropathy     feet bilat     Past Surgical History  Procedure Laterality Date  . Foramen magnum ependymoma surgery  2003    Dr Rita Ohara  . Total knee arthroplasty      L 2008, R 2009- Dr Maureen Ralphs  . Joint replacement Bilateral   . Tonsillectomy    . Nasal sinus surgery      1973   . Total knee revision Right 03/05/2015    Procedure: RIGHT TOTAL KNEE ARTHROPLASTY REVISION;  Surgeon: Gaynelle Arabian, MD;  Location: [facility];  Service: Orthopedics;  Laterality: Right;    There were no vitals filed for this visit.  Visit Diagnosis:  Weakness of right leg  Decreased ROM of right knee  Knee stiffness, right      Subjective Assessment - 04/22/15 1337    Subjective Doing fine able to walk better, faster. incommunity   Currently in Pain? No/denies                         [facility] Adult PT Treatment/Exercise - 04/22/15 1340    High Level Balance   High Level Balance Activities --  narrowed base of support, dynamic activities, close stand by   Knee/Hip Exercises: Aerobic   Stationary Bike Nustep Level 4 changed to Level 6.   Knee/Hip Exercises: Standing   Heel Raises --  1 leg 6 reps, 2 legs    Knee Flexion Right   Knee Flexion Limitations 2 plates Rt only, 3 plates both   Functional Squat 10 reps   Rocker Board Limitations 4 feet minisquats  10 reps   SLS with Vectors plyotoss close SBA   Knee/Hip Exercises: Seated   Heel Slides --  theraband green 2 sets 10 reps, flexion   Knee/Hip Exercises: Supine   Short Arc Quad Sets --  6.11 lbs 10 reps each. total 20 reps   Heel Slides 10 reps   Heel Slides Limitations 117 AROM flexion Rt   Bridges 10 reps   Knee/Hip Exercises: Sidelying   Clams 20   worked hip hard.                  PT  Short Term Goals - 04/14/15 1602    PT SHORT TERM GOAL #1   Title pt will be I with basic HEP (04/14/2015)   Time 3   Period Weeks   Status Achieved           PT Long Term Goals - 04/22/15 1516    PT LONG TERM GOAL #1   Title She will be I with Advanced HEP (05/05/2015)   Time 6   Period Weeks   Status On-going   PT LONG TERM GOAL #2   Title pt will increase R knee flexion AROM to >135 degrees and ext to 0 degrees to assist with an effiecent gait pattern (05/05/2015)   Baseline 117   Time 6   Period Weeks   Status On-going   PT LONG TERM GOAL #3   Title pt will increase R knee strength to greater >4+/5 to assist with endurance for prolonged walking/ stand to help with pt goal of walking through [place] (05/05/2015)   Time 6   Period Weeks   Status On-going   PT LONG TERM GOAL #4   Title pt will decrease her 10 MWT by >3 sec to assist with functional and safe gait (05/05/2015)   Time 6   Period Weeks   Status Unable to assess   PT LONG TERM GOAL #5   Title she will be able to verbalize and demonstrate techniques to reduce risk of R knee  reinjury by postural awareness, proper lifting/carrying and gait mechanics, and HEP (05/05/2015)   Time 6   Period Weeks   Status Unable to assess               Plan - 04/22/15 1517    PT Next Visit Plan Balance, closed chain balance, ask about lift carry technique   Flexion stretches. Work toward goals        Problem List Patient Active Problem List   Diagnosis Date Noted  . 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 OA (osteoarthritis) of knee 03/05/2015  . Rib pain on left side 02/27/2015  . URI, acute 09/17/2013  . Swelling of limb-Bilateral leg Left > than right 07/11/2013  . Numbness-Left foot 07/11/2013  . Varicose veins of lower extremities with other complications [id number]  . Left hand pain 02/09/2013  . Right shoulder pain 09/12/2012  . Edema 04/24/2012  . Grief 11/10/2011  . ABSCESS, TOOTH 02/03/2011  . OSTEOPENIA 10/28/2010  . HYPERKALEMIA 07/15/2010  . ARTHRALGIA 04/08/2010  . FATIGUE 04/08/2010  . XEROSTOMIA 02/04/2010  . ECZEMA 10/29/2009  . TOBACCO USE, QUIT 10/29/2009  . Anemia 06/18/2009  . 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 Diarrhea 06/18/2009  . OPACITY, VITREOUS HUMOR 01/15/2009  . COLITIS 10/16/2008  . KNEE PAIN 04/10/2008  . HYPERCHOLESTEROLEMIA 01/10/2008  . ANXIETY 11/08/2007  . Depression 11/08/2007  . Osteoarthritis 11/08/2007  . LOW BACK PAIN 11/08/2007  . Diabetes mellitus type 2, controlled 10/07/2007  . Essential hypertension 10/07/2007  . GERD 10/07/2007  . MICROALBUMINURIA 10/07/2007    HARRIS,KAREN 04/22/2015, 3:20 PM  [facility] [address] [place], [place], [address] Phone: [phone]   Fax:  [phone]  Melvenia Needles, PTA 04/22/2015 3:20 PM Phone: [phone] Fax: [phone]

## 2015-04-24 ENCOUNTER — Ambulatory Visit: Payer: 59 | Admitting: Physical Therapy

## 2015-04-24 DIAGNOSIS — R269 Unspecified abnormalities of gait and mobility: Secondary | ICD-10-CM

## 2015-04-24 DIAGNOSIS — M25561 Pain in right knee: Secondary | ICD-10-CM

## 2015-04-24 DIAGNOSIS — R29898 Other symptoms and signs involving the musculoskeletal system: Secondary | ICD-10-CM

## 2015-04-24 DIAGNOSIS — M25661 Stiffness of right knee, not elsewhere classified: Secondary | ICD-10-CM

## 2015-04-24 NOTE — Progress Notes (Signed)
ATTENDING PHYSICIAN NOTE: I have reviewed the chart and agree with the plan as detailed above. Kynsleigh Neal MD Pager [phone]  

## 2015-04-24 NOTE — Therapy (Signed)
[place], [place], [address] Phone: [phone]   Fax:  [phone]  Physical Therapy Treatment  Patient Details  Name: LIZZIEANN GIOVANNI MRN: [id number] Date of Birth: 04-May-1951 Referring Provider:  Cassandria Anger, MD  Encounter Date: 04/24/2015      PT End of Session - 04/24/15 1507    Visit Number 9   Number of Visits 12   Date for PT Re-Evaluation 05/05/15   PT Start Time 1330   PT Stop Time 1415   PT Time Calculation (min) 45 min   Activity Tolerance Patient tolerated treatment well   Behavior During Therapy [facility] for tasks assessed/performed      Past Medical History  Diagnosis Date  . Diabetes mellitus     type II  . GERD (gastroesophageal reflux disease)   . Hypertension   . IBS (irritable bowel syndrome)   . Depression   . Anxiety   . LBP (low back pain)   . Osteoarthritis   . Colitis 2010    microscopic- Dr Henrene Pastor  . Renal insufficiency 2011  . Osteopenia   . Anemia   . Pneumonia     hx of 2014   . Headache   . Sinusitis     currently being treated with antibiotic will complete 03/04/2015  . Neuropathy     feet bilat     Past Surgical History  Procedure Laterality Date  . Foramen magnum ependymoma surgery  2003    Dr Rita Ohara  . Total knee arthroplasty      L 2008, R 2009- Dr Maureen Ralphs  . Joint replacement Bilateral   . Tonsillectomy    . Nasal sinus surgery      1973   . Total knee revision Right 03/05/2015    Procedure: RIGHT TOTAL KNEE ARTHROPLASTY REVISION;  Surgeon: Gaynelle Arabian, MD;  Location: [facility];  Service: Orthopedics;  Laterality: Right;    There were no vitals filed for this visit.  Visit Diagnosis:  Weakness of right leg  Decreased ROM of right knee  Knee stiffness, right  Abnormality of gait  Right knee pain      Subjective Assessment - 04/24/15 1336    Subjective pt reports that she has been having difficulty sleeping and just feels off. She  reports that she does feel like she is getting stronger, and is able to walk longer/easier.    Currently in Pain? Yes  no pain just stiff   Pain Score 0-No pain   Pain Location Knee                         [facility] Adult PT Treatment/Exercise - 04/24/15 1337    Knee/Hip Exercises: Stretches   Passive Hamstring Stretch 2 reps;30 seconds   Knee/Hip Exercises: Aerobic   Stationary Bike Nustep Level 6 x 8 min  increased to level 7 at 2 min   Knee/Hip Exercises: Machines for Strengthening   Cybex Knee Extension 2 x 10 15#   Cybex Knee Flexion 2 plates: 2 x 10 reps   Total Gym Leg Press 2 x 15 20#  to assist with endurance   Knee/Hip Exercises: Standing   Heel Raises 2 sets;20 reps   Lateral Step Up 20 reps;2 sets;Step Height: 4";Step Height: 6"   Forward Step Up Both;2 sets;Step Height: 4";Step Height: 6"   Functional Squat 1 set;15 reps   Other Standing Knee Exercises sit to stand 10 reps  ,  0 hands   Other Standing Knee Exercises hip extension/abduction 2x  20 reps both  on each side   Knee/Hip Exercises: Supine   Bridges AROM;Strengthening;Both;1 set;15 reps                PT Education - 04/24/15 1506    Education Details tandem balance in corner with chair  added to HEP   Person(s) Educated Patient   Methods Explanation   Comprehension Verbalized understanding          PT Short Term Goals - 04/14/15 1602    PT SHORT TERM GOAL #1   Title pt will be I with basic HEP (04/14/2015)   Time 3   Period Weeks   Status Achieved           PT Long Term Goals - 04/22/15 1516    PT LONG TERM GOAL #1   Title She will be I with Advanced HEP (05/05/2015)   Time 6   Period Weeks   Status On-going   PT LONG TERM GOAL #2   Title pt will increase R knee flexion AROM to >135 degrees and ext to 0 degrees to assist with an effiecent gait pattern (05/05/2015)   Baseline 117   Time 6   Period Weeks   Status On-going   PT LONG TERM GOAL #3   Title pt will  increase R knee strength to greater >4+/5 to assist with endurance for prolonged walking/ stand to help with pt goal of walking through [place] (05/05/2015)   Time 6   Period Weeks   Status On-going   PT LONG TERM GOAL #4   Title pt will decrease her 10 MWT by >3 sec to assist with functional and safe gait (05/05/2015)   Time 6   Period Weeks   Status Unable to assess   PT LONG TERM GOAL #5   Title she will be able to verbalize and demonstrate techniques to reduce risk of R knee reinjury by postural awareness, proper lifting/carrying and gait mechanics, and HEP (05/05/2015)   Time 6   Period Weeks   Status Unable to assess               Plan - 04/24/15 1507    Clinical Impression Statement Talita presents to therapy today with report of feeling "foggy" due to not getting alot of sleep the last few nights. She continues to make progress with increased endurance during walking and standing per pt report and no pain in the knee. Focused todays treatment on hip/knee strengthe training and balance exercises. She tolerated modified tandem stance with mild postural sway.  Tandem balance in corner with a chair for support PRN for safety was added to her HEP. Plan to progress with  balance and strengthening.    PT Next Visit Plan Balance, closed chain balance, ask about lift carry technique   Flexion stretches. Work toward goals, Social worker (10th visit)    PT Home Exercise Plan tandem balance in corner with chair for safety   Consulted and Agree with Plan of Care Patient        Problem List Patient Active Problem List   Diagnosis Date Noted  . 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 OA (osteoarthritis) of knee 03/05/2015  . Rib pain on left side 02/27/2015  . URI, acute 09/17/2013  . Swelling of limb-Bilateral leg Left > than right 07/11/2013  . Numbness-Left foot 07/11/2013  . Varicose veins of lower extremities with other complications [id number]  .  Left hand pain  02/09/2013  . Right shoulder pain 09/12/2012  . Edema 04/24/2012  . Grief 11/10/2011  . ABSCESS, TOOTH 02/03/2011  . OSTEOPENIA 10/28/2010  . HYPERKALEMIA 07/15/2010  . ARTHRALGIA 04/08/2010  . FATIGUE 04/08/2010  . XEROSTOMIA 02/04/2010  . ECZEMA 10/29/2009  . TOBACCO USE, QUIT 10/29/2009  . Anemia 06/18/2009  . 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 Diarrhea 06/18/2009  . OPACITY, VITREOUS HUMOR 01/15/2009  . COLITIS 10/16/2008  . KNEE PAIN 04/10/2008  . HYPERCHOLESTEROLEMIA 01/10/2008  . ANXIETY 11/08/2007  . Depression 11/08/2007  . Osteoarthritis 11/08/2007  . LOW BACK PAIN 11/08/2007  . Diabetes mellitus type 2, controlled 10/07/2007  . Essential hypertension 10/07/2007  . GERD 10/07/2007  . MICROALBUMINURIA 10/07/2007   Starr Lake PT, DPT, LAT, ATC  04/24/2015  3:12 PM   [facility] [address] [place] [address], [place], [address] Phone: [phone]   Fax:  [phone]

## 2015-04-24 NOTE — Patient Instructions (Signed)
   Kristoffer Leamon PT, DPT, LAT, ATC  [facility] Phone: [phone]     

## 2015-04-29 ENCOUNTER — Ambulatory Visit: Payer: 59 | Admitting: Physical Therapy

## 2015-04-29 DIAGNOSIS — R269 Unspecified abnormalities of gait and mobility: Secondary | ICD-10-CM

## 2015-04-29 DIAGNOSIS — M25661 Stiffness of right knee, not elsewhere classified: Secondary | ICD-10-CM

## 2015-04-29 DIAGNOSIS — M25461 Effusion, right knee: Secondary | ICD-10-CM

## 2015-04-29 DIAGNOSIS — R29898 Other symptoms and signs involving the musculoskeletal system: Secondary | ICD-10-CM

## 2015-04-29 DIAGNOSIS — M25561 Pain in right knee: Secondary | ICD-10-CM

## 2015-04-29 NOTE — Therapy (Signed)
[place], [place], [address] Phone: [phone]   Fax:  [phone]  Physical Therapy Treatment  Patient Details  Name: [name] MRN: [id number] Date of Birth: [birth year] Referring Provider:  Cassandria Anger, MD  Encounter Date: 04/29/2015      PT End of Session - 04/29/15 1545    Visit Number 10   Number of Visits 12   Date for PT Re-Evaluation 05/05/15   PT Start Time 6384   PT Stop Time 1500   PT Time Calculation (min) 45 min   Activity Tolerance Patient tolerated treatment well   Behavior During Therapy [facility] for tasks assessed/performed      Past Medical History  Diagnosis Date  . Diabetes mellitus     type II  . GERD (gastroesophageal reflux disease)   . Hypertension   . IBS (irritable bowel syndrome)   . Depression   . Anxiety   . LBP (low back pain)   . Osteoarthritis   . Colitis 2010    microscopic- Dr Henrene Pastor  . Renal insufficiency 2011  . Osteopenia   . Anemia   . Pneumonia     hx of 2014   . Headache   . Sinusitis     currently being treated with antibiotic will complete 03/04/2015  . Neuropathy     feet bilat     Past Surgical History  Procedure Laterality Date  . Foramen magnum ependymoma surgery  2003    Dr Rita Ohara  . Total knee arthroplasty      L 2008, R 2009- Dr Maureen Ralphs  . Joint replacement Bilateral   . Tonsillectomy    . Nasal sinus surgery      1973   . Total knee revision Right 03/05/2015    Procedure: RIGHT TOTAL KNEE ARTHROPLASTY REVISION;  Surgeon: Gaynelle Arabian, MD;  Location: [facility];  Service: Orthopedics;  Laterality: Right;    There were no vitals filed for this visit.  Visit Diagnosis:  Weakness of right leg  Decreased ROM of right knee  Knee stiffness, right  Abnormality of gait  Right knee pain  Knee swelling, right      Subjective Assessment - 04/29/15 1423    Subjective " I did well this last Friday, but had increased  soreness and pain last Saturday"  pt reports that she realized she has the most difficulty on steps.    Currently in Pain? Yes   Pain Score 0-No pain   Pain Location Knee   Pain Orientation Lower            [facility] PT Assessment - 04/29/15 0001    Observation/Other Assessments   Focus on Therapeutic Outcomes (FOTO)  43% limited   AROM   Right Knee Extension -4   Right Knee Flexion 120                     [facility] Adult PT Treatment/Exercise - 04/29/15 0001    Knee/Hip Exercises: Stretches   Passive Hamstring Stretch 2 reps;30 seconds   Quad Stretch 1 rep;30 seconds   Knee/Hip Exercises: Aerobic   Stationary Bike Nustep Level 6 x 8 min   Knee/Hip Exercises: Machines for Strengthening   Cybex Knee Extension 2 x 10 15#   Cybex Knee Flexion 2 plates: 2 x 10 reps   Total Gym Leg Press 2 x 15 20#   Knee/Hip Exercises: Standing   Heel Raises 2 sets;20 reps   Lateral  Step Up 20 reps;2 sets;Step Height: 4";Step Height: 6"   Forward Step Up Both;2 sets;Step Height: 4";Step Height: 6"   Functional Squat 1 set;15 reps   Rocker Board Limitations 4 feet minisquats   Other Standing Knee Exercises sit to stand 10 reps  ,0 hands   Other Standing Knee Exercises hip extension/abduction 2x  20 reps both   Knee/Hip Exercises: Supine   Bridges AROM;Strengthening;Both;2 sets;10 reps                PT Education - 04/29/15 1545    Education provided Yes   Education Details to progress HEP to continue challenging strength   Person(s) Educated Patient   Methods Explanation   Comprehension Verbalized understanding          PT Short Term Goals - 04/14/15 1602    PT SHORT TERM GOAL #1   Title pt will be I with basic HEP (04/14/2015)   Time 3   Period Weeks   Status Achieved           PT Long Term Goals - 04/29/15 1547    PT LONG TERM GOAL #1   Title She will be I with Advanced HEP (05/05/2015)   Time 6   Period Weeks   Status On-going   PT LONG TERM GOAL #2    Title pt will increase R knee flexion AROM to >135 degrees and ext to 0 degrees to assist with an effiecent gait pattern (05/05/2015)   Baseline 117   Time 6   Period Weeks   Status On-going   PT LONG TERM GOAL #3   Title pt will increase R knee strength to greater >4+/5 to assist with endurance for prolonged walking/ stand to help with pt goal of walking through [place] (05/05/2015)   Time 6   Period Weeks   Status On-going   PT LONG TERM GOAL #4   Title pt will decrease her 10 MWT by >3 sec to assist with functional and safe gait (05/05/2015)   Time 6   Period Weeks   Status On-going   PT LONG TERM GOAL #5   Title she will be able to verbalize and demonstrate techniques to reduce risk of R knee reinjury by postural awareness, proper lifting/carrying and gait mechanics, and HEP (05/05/2015)   Time 6   Period Weeks   Status On-going               Plan - 04/29/15 1546    Clinical Impression Statement [name] has made great progress with decreased pain during prolong standing, and walking but continues to report difficulty with increased fatigue while navigating up/down steps. She tolerated exercises well today with no report of difficulty or increase of pain.  No new goals met this visit. Discussed benefit of continuing her aquatic strengthening program and plans to bring in her regimen to see if anything can be added to it   PT Next Visit Plan Balance, closed chain balance, ask about lift carry technique   Flexion stretches. Work toward goals        Problem List Patient Active Problem List   Diagnosis Date Noted  . 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 OA (osteoarthritis) of knee 03/05/2015  . Rib pain on left side 02/27/2015  . URI, acute 09/17/2013  . Swelling of limb-Bilateral leg Left > than right 07/11/2013  . Numbness-Left foot 07/11/2013  . Varicose veins of lower extremities with other complications 72/53/6644  . Left hand pain  02/09/2013  . Right  shoulder pain 09/12/2012  . Edema 04/24/2012  . Grief 11/10/2011  . ABSCESS, TOOTH 02/03/2011  . OSTEOPENIA 10/28/2010  . HYPERKALEMIA 07/15/2010  . ARTHRALGIA 04/08/2010  . FATIGUE 04/08/2010  . XEROSTOMIA 02/04/2010  . ECZEMA 10/29/2009  . TOBACCO USE, QUIT 10/29/2009  . Anemia 06/18/2009  . 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 Diarrhea 06/18/2009  . OPACITY, VITREOUS HUMOR 01/15/2009  . COLITIS 10/16/2008  . KNEE PAIN 04/10/2008  . HYPERCHOLESTEROLEMIA 01/10/2008  . ANXIETY 11/08/2007  . Depression 11/08/2007  . Osteoarthritis 11/08/2007  . LOW BACK PAIN 11/08/2007  . Diabetes mellitus type 2, controlled 10/07/2007  . Essential hypertension 10/07/2007  . GERD 10/07/2007  . MICROALBUMINURIA 10/07/2007   Starr Lake PT, DPT, LAT, ATC  04/29/2015  3:50 PM    [facility] [address] [place], [place], [address] Phone: [phone]   Fax:  [phone]

## 2015-05-02 ENCOUNTER — Ambulatory Visit: Payer: 59 | Admitting: Physical Therapy

## 2015-05-06 ENCOUNTER — Other Ambulatory Visit: Payer: Self-pay | Admitting: *Deleted

## 2015-05-06 ENCOUNTER — Ambulatory Visit: Payer: 59 | Admitting: Physical Therapy

## 2015-05-06 DIAGNOSIS — M25561 Pain in right knee: Secondary | ICD-10-CM

## 2015-05-06 DIAGNOSIS — M25661 Stiffness of right knee, not elsewhere classified: Secondary | ICD-10-CM

## 2015-05-06 DIAGNOSIS — R29898 Other symptoms and signs involving the musculoskeletal system: Secondary | ICD-10-CM

## 2015-05-06 DIAGNOSIS — R269 Unspecified abnormalities of gait and mobility: Secondary | ICD-10-CM

## 2015-05-06 DIAGNOSIS — M25461 Effusion, right knee: Secondary | ICD-10-CM

## 2015-05-06 NOTE — Therapy (Signed)
[place], [place], [address] Phone: [phone]   Fax:  [phone]  Physical Therapy Treatment/ Discharge Note  Patient Details  Name: [name] MRN: [id number] Date of Birth: 03-23-1951 Referring Provider:  Cassandria Anger, MD  Encounter Date: 05/06/2015      PT End of Session - 05/06/15 1715    Visit Number 11   Number of Visits 12   Date for PT Re-Evaluation 05/05/15   PT Start Time 0430   PT Stop Time 0515   PT Time Calculation (min) 45 min   Activity Tolerance Patient tolerated treatment well   Behavior During Therapy [facility] for tasks assessed/performed      Past Medical History  Diagnosis Date  . Diabetes mellitus     type II  . GERD (gastroesophageal reflux disease)   . Hypertension   . IBS (irritable bowel syndrome)   . Depression   . Anxiety   . LBP (low back pain)   . Osteoarthritis   . Colitis 2010    microscopic- Dr Henrene Pastor  . Renal insufficiency 2011  . Osteopenia   . Anemia   . Pneumonia     hx of 2014   . Headache   . Sinusitis     currently being treated with antibiotic will complete 03/04/2015  . Neuropathy     feet bilat     Past Surgical History  Procedure Laterality Date  . Foramen magnum ependymoma surgery  2003    Dr Rita Ohara  . Total knee arthroplasty      L 2008, R 2009- Dr Maureen Ralphs  . Joint replacement Bilateral   . Tonsillectomy    . Nasal sinus surgery      1973   . Total knee revision Right 03/05/2015    Procedure: RIGHT TOTAL KNEE ARTHROPLASTY REVISION;  Surgeon: Gaynelle Arabian, MD;  Location: [facility];  Service: Orthopedics;  Laterality: Right;    There were no vitals filed for this visit.  Visit Diagnosis:  Weakness of right leg  Decreased ROM of right knee  Knee stiffness, right  Abnormality of gait  Right knee pain  Knee swelling, right      Subjective Assessment - 05/06/15 1651    Subjective pt reports falling the other day due  to catching her foot on her ironing board, but didn't fall down onto her L knee saving her R knee. Otherwise she reports that she has been doing well.    Currently in Pain? No/denies            [facility] PT Assessment - 05/06/15 0001    Observation/Other Assessments   Focus on Therapeutic Outcomes (FOTO)  42% limitation   AROM   Right Knee Extension -4   Right Knee Flexion 122   Strength   Right Knee Flexion 5/5   Right Knee Extension 5/5   Left Knee Flexion 5/5   Left Knee Extension 5/5                             PT Education - 05/06/15 1715    Education provided Yes   Education Details HEP review   Person(s) Educated Patient   Methods Explanation   Comprehension Verbalized understanding          PT Short Term Goals - 05/06/15 1652    PT SHORT TERM GOAL #1   Title pt will be I with basic HEP (04/14/2015)  Time 3   Period Weeks   Status Achieved           PT Long Term Goals - 05/06/15 1652    PT LONG TERM GOAL #1   Title She will be I with Advanced HEP (05/05/2015)   Time 6   Period Weeks   Status Achieved   PT LONG TERM GOAL #2   Title pt will increase R knee flexion AROM to >135 degrees and ext to 0 degrees to assist with an effiecent gait pattern (05/05/2015)   Baseline 117   Time 6   Period Weeks   Status Partially Met   PT LONG TERM GOAL #3   Title pt will increase R knee strength to greater >4+/5 to assist with endurance for prolonged walking/ stand to help with pt goal of walking through [place] (05/05/2015)   Time 6   Period Weeks   Status Achieved   PT LONG TERM GOAL #4   Title pt will decrease her 10 MWT by >3 sec to assist with functional and safe gait (05/05/2015)   Time 6   Period Weeks   Status On-going   PT LONG TERM GOAL #5   Title she will be able to verbalize and demonstrate techniques to reduce risk of R knee reinjury by postural awareness, proper lifting/carrying and gait mechanics, and HEP (05/05/2015)   Time  6   Period Weeks   Status Achieved               Plan - 05/06/15 1715    Clinical Impression Statement [place] has demonstrate great progress with AROM of the knee at 122 to -4 with 5/5 strength. She has improved her 10 WMT to 1.25 M/s which is functional community ambulation. She has met all goals except for partially meeting LTG #2. She reports that she will begin a personal exercise regimen utilizing the pool and gym equipment to continue the progress she has made with hip strengthening and  static/dynamic balance. She no longer requires skilled physical therapy and can maintain her exercise program independenlty.    PT Next Visit Plan discharged this visit   PT Home Exercise Plan HEP review   Consulted and Agree with Plan of Care Patient        Problem List Patient Active Problem List   Diagnosis Date Noted  . 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 OA (osteoarthritis) of knee 03/05/2015  . Rib pain on left side 02/27/2015  . URI, acute 09/17/2013  . Swelling of limb-Bilateral leg Left > than right 07/11/2013  . Numbness-Left foot 07/11/2013  . Varicose veins of lower extremities with other complications 03/83/3383  . Left hand pain 02/09/2013  . Right shoulder pain 09/12/2012  . Edema 04/24/2012  . Grief 11/10/2011  . ABSCESS, TOOTH 02/03/2011  . OSTEOPENIA 10/28/2010  . HYPERKALEMIA 07/15/2010  . ARTHRALGIA 04/08/2010  . FATIGUE 04/08/2010  . XEROSTOMIA 02/04/2010  . ECZEMA 10/29/2009  . TOBACCO USE, QUIT 10/29/2009  . Anemia 06/18/2009  . 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 Diarrhea 06/18/2009  . OPACITY, VITREOUS HUMOR 01/15/2009  . COLITIS 10/16/2008  . KNEE PAIN 04/10/2008  . HYPERCHOLESTEROLEMIA 01/10/2008  . ANXIETY 11/08/2007  . Depression 11/08/2007  . Osteoarthritis 11/08/2007  . LOW BACK PAIN 11/08/2007  . Diabetes mellitus type 2, controlled 10/07/2007  . Essential hypertension 10/07/2007  . GERD 10/07/2007   . MICROALBUMINURIA 10/07/2007  PHYSICAL THERAPY DISCHARGE SUMMARY  Visits from Start of Care: 11  Current functional level related to goals / functional outcomes: FOTO score 58%   Remaining deficits: Mild hip weakness, and fatigue with prolonged walking/steps   Education / Equipment: HEP, aquatic therapy education,   Plan: Patient agrees to discharge.  Patient goals were partially met. Patient is being discharged due to meeting the stated rehab goals.  ?????      Starr Lake PT, DPT, LAT, ATC  05/06/2015  5:26 PM    [facility] [address] [place], [place], [address] Phone: [phone]   Fax:  [phone]

## 2015-05-09 ENCOUNTER — Encounter: Payer: 59 | Admitting: Physical Therapy

## 2015-05-12 ENCOUNTER — Emergency Department (HOSPITAL_COMMUNITY)
Admission: EM | Admit: 2015-05-12 | Discharge: 2015-05-12 | Disposition: A | Discharge status: Home / Self Care | Payer: 59 | Attending: Emergency Medicine | Admitting: Emergency Medicine

## 2015-05-12 ENCOUNTER — Encounter (HOSPITAL_COMMUNITY): Payer: Self-pay | Admitting: Emergency Medicine

## 2015-05-12 ENCOUNTER — Emergency Department (HOSPITAL_COMMUNITY): Payer: 59

## 2015-05-12 DIAGNOSIS — E119 Type 2 diabetes mellitus without complications: Secondary | ICD-10-CM | POA: Insufficient documentation

## 2015-05-12 DIAGNOSIS — Y9389 Activity, other specified: Secondary | ICD-10-CM | POA: Insufficient documentation

## 2015-05-12 DIAGNOSIS — K219 Gastro-esophageal reflux disease without esophagitis: Secondary | ICD-10-CM | POA: Diagnosis not present

## 2015-05-12 DIAGNOSIS — S99912A Unspecified injury of left ankle, initial encounter: Secondary | ICD-10-CM | POA: Diagnosis present

## 2015-05-12 DIAGNOSIS — Z862 Personal history of diseases of the blood and blood-forming organs and certain disorders involving the immune mechanism: Secondary | ICD-10-CM | POA: Diagnosis not present

## 2015-05-12 DIAGNOSIS — Z87891 Personal history of nicotine dependence: Secondary | ICD-10-CM | POA: Insufficient documentation

## 2015-05-12 DIAGNOSIS — Z79899 Other long term (current) drug therapy: Secondary | ICD-10-CM | POA: Insufficient documentation

## 2015-05-12 DIAGNOSIS — F329 Major depressive disorder, single episode, unspecified: Secondary | ICD-10-CM | POA: Diagnosis not present

## 2015-05-12 DIAGNOSIS — Z87448 Personal history of other diseases of urinary system: Secondary | ICD-10-CM | POA: Insufficient documentation

## 2015-05-12 DIAGNOSIS — F419 Anxiety disorder, unspecified: Secondary | ICD-10-CM | POA: Diagnosis not present

## 2015-05-12 DIAGNOSIS — Z8669 Personal history of other diseases of the nervous system and sense organs: Secondary | ICD-10-CM | POA: Insufficient documentation

## 2015-05-12 DIAGNOSIS — Y9289 Other specified places as the place of occurrence of the external cause: Secondary | ICD-10-CM | POA: Insufficient documentation

## 2015-05-12 DIAGNOSIS — X58XXXA Exposure to other specified factors, initial encounter: Secondary | ICD-10-CM | POA: Diagnosis not present

## 2015-05-12 DIAGNOSIS — Y998 Other external cause status: Secondary | ICD-10-CM | POA: Insufficient documentation

## 2015-05-12 DIAGNOSIS — I10 Essential (primary) hypertension: Secondary | ICD-10-CM | POA: Insufficient documentation

## 2015-05-12 DIAGNOSIS — Z8701 Personal history of pneumonia (recurrent): Secondary | ICD-10-CM | POA: Insufficient documentation

## 2015-05-12 MED ORDER — ACETAMINOPHEN 325 MG PO TABS
650.0000 mg | ORAL_TABLET | Freq: Once | ORAL | Status: AC
  Administered 2015-05-12: 650 mg via ORAL
  Filled 2015-05-12: qty 2

## 2015-05-12 NOTE — ED Provider Notes (Signed)
CSN: [id number]     Arrival date & time 05/12/15  1131 History   First MD Initiated Contact with Patient 05/12/15 1155     Chief Complaint  Patient presents with  . Ankle Pain     (Consider location/radiation/quality/duration/timing/severity/associated sxs/prior Treatment) HPI  [name] is a 64 y.o. female with PMH of DM, HTN, OA, neuropathy presenting with left ankle injury last night. Patient stated she will call up and took a step off the bed and twisted her left ankle and noticed pain with swelling to medial aspect. She took an aspirin improved her symptoms. She denies any numbness, tingling, weakness. She denies fevers, chills, nausea, vomiting. She hasn't taken anything for her symptoms today. Patient recovering after right knee surgery and has narcotic pains at home and denies needing further ones.   Past Medical History  Diagnosis Date  . Diabetes mellitus     type II  . GERD (gastroesophageal reflux disease)   . Hypertension   . IBS (irritable bowel syndrome)   . Depression   . Anxiety   . LBP (low back pain)   . Osteoarthritis   . Colitis 2010    microscopic- Dr Henrene Pastor  . Renal insufficiency 2011  . Osteopenia   . Anemia   . Pneumonia     hx of 2014   . Headache   . Sinusitis     currently being treated with antibiotic will complete 03/04/2015  . Neuropathy     feet bilat    Past Surgical History  Procedure Laterality Date  . Foramen magnum ependymoma surgery  2003    Dr Rita Ohara  . Total knee arthroplasty      L 2008, R 2009- Dr Maureen Ralphs  . Joint replacement Bilateral   . Tonsillectomy    . Nasal sinus surgery      1973   . Total knee revision Right 03/05/2015    Procedure: RIGHT TOTAL KNEE ARTHROPLASTY REVISION;  Surgeon: Gaynelle Arabian, MD;  Location: [facility];  Service: Orthopedics;  Laterality: Right;   Family History  Problem Relation Age of Onset  . Crohn's disease Maternal Uncle   . Diabetes Other   . Depression Mother   . Hypertension Mother    . Stroke Mother 23  . Diabetes Mother   . Diabetes Father   . Hyperlipidemia Father   . Hypertension Father   . Cancer Brother     Prostate  . Diabetes Brother   . Heart disease Brother   . Hyperlipidemia Brother   . Hypertension Brother   . Diabetes Brother   . Heart disease Brother     Heart Disease before age 45  . Heart attack Brother   . Stroke Brother     X's 2  . Hyperlipidemia Brother   . Hypertension Brother   . Colon cancer Neg Hx    History  Substance Use Topics  . Smoking status: Former Smoker -- 1.00 packs/day for 20 years    Types: Cigarettes    Quit date: 12/13/1988  . Smokeless tobacco: Never Used  . Alcohol Use: 0.0 oz/week    0 Standard drinks or equivalent per week     Comment: rarely   OB History    No data available     Review of Systems  Musculoskeletal: Positive for myalgias and joint swelling.  Skin: Negative for color change and wound.  Neurological: Negative for weakness and numbness.      Allergies  Erythromycin  Home Medications  Prior to Admission medications   Medication Sig Start Date End Date Taking? Authorizing Provider  buPROPion (WELLBUTRIN XL) 150 MG 24 hr tablet Take 150 mg by mouth 2 (two) times daily.   Yes Historical Provider, MD  carvedilol (COREG) 25 MG tablet Take 1 tablet (25 mg total) by mouth 2 (two) times daily with a meal. 03/26/15  Yes Aleksei Plotnikov V, MD  cevimeline (EVOXAC) 30 MG capsule 2 in the morning and 1 in the evening Patient taking differently: Take 30-60 mg by mouth 2 (two) times daily. She takes one capsule in the morning and two capsules at bedtime. 03/07/15  Yes Gaynelle Arabian, MD  cyclobenzaprine (FLEXERIL) 10 MG tablet Take 3 tablets (30 mg total) by mouth at bedtime. Patient taking differently: Take 20 mg by mouth at bedtime.  03/07/15  Yes Gaynelle Arabian, MD  DULoxetine (CYMBALTA) 60 MG capsule Take 1 capsule (60 mg total) by mouth at bedtime. 03/07/15  Yes Gaynelle Arabian, MD  furosemide  (LASIX) 40 MG tablet TAKE 1/2 TO 1 TABLET BY MOUTH ONCE DAILY AS NEEDED FOR SWELLING Patient taking differently: Take 20-40 mg by mouth daily as needed (For swelling.).  03/07/15  Yes Gaynelle Arabian, MD  glimepiride (AMARYL) 1 MG tablet TAKE 1 TABLET BY MOUTH DAILY BEFORE BREAKFAST. Patient taking differently: Take 1 mg by mouth daily with breakfast.  03/26/15  Yes Aleksei Plotnikov V, MD  glucose blood (ONE TOUCH TEST STRIPS) test strip Use as instructed 06/26/14  Yes Aleksei Plotnikov V, MD  ibuprofen (ADVIL,MOTRIN) 200 MG tablet Take 800 mg by mouth 2 (two) times daily as needed (For pain.).   Yes Historical Provider, MD  Lancets [facility] ULTRASOFT) lancets Use as instructed 08/10/11  Yes Aleksei Plotnikov V, MD  lidocaine (LIDODERM) 5 % Place 1-3 patches onto the skin daily as needed (Pain). Patient taking differently: Place 1-3 patches onto the skin daily as needed (For pain.).  03/07/15  Yes Gaynelle Arabian, MD  LORazepam (ATIVAN) 2 MG tablet Take 1 mg by mouth every 6 (six) hours as needed for anxiety.   Yes Historical Provider, MD  LUTEIN PO Take 1 tablet by mouth at bedtime.   Yes Historical Provider, MD  metFORMIN (GLUCOPHAGE) 1000 MG tablet Take 1,000 mg by mouth 2 (two) times daily with a meal.   Yes Historical Provider, MD  Multiple Vitamin (MULTIVITAMIN WITH MINERALS) TABS tablet Take 1 tablet by mouth daily.   Yes Historical Provider, MD  Omega-3 300 MG CAPS Take 300 mg by mouth at bedtime.   Yes Historical Provider, MD  omeprazole (PRILOSEC) 20 MG capsule Take 1 capsule (20 mg total) by mouth daily. 06/26/14  Yes Aleksei Plotnikov V, MD  oxybutynin (DITROPAN) 5 MG tablet Take 0.5 tablets (2.5 mg total) by mouth daily. 09/25/14  Yes Aleksei Plotnikov V, MD  OxyCODONE (OXYCONTIN) 20 mg T12A 12 hr tablet Take 1 tablet (20 mg total) by mouth every 12 (twelve) hours. Please fill on or after 04/27/15 03/26/15  Yes Aleksei Plotnikov V, MD  oxyCODONE (ROXICODONE) 15 MG immediate release tablet Take 1  tablet (15 mg total) by mouth every 6 (six) hours as needed. Please fill on or after 04/25/15 Patient taking differently: Take 15 mg by mouth every 6 (six) hours as needed for pain.  03/26/15  Yes Aleksei Plotnikov V, MD  pioglitazone (ACTOS) 15 MG tablet Take 1 tablet (15 mg total) by mouth daily. 03/26/15  Yes Aleksei Plotnikov V, MD  polyvinyl alcohol (LIQUIFILM TEARS) 1.4 % ophthalmic solution  Place 1 drop into both eyes daily as needed for dry eyes.   Yes Historical Provider, MD  PSYLLIUM PO Take 1 capsule by mouth 2 (two) times daily.   Yes Historical Provider, MD  Vitamin D, Cholecalciferol, 1000 UNITS TABS Take 1,000 Units by mouth daily.   Yes Historical Provider, MD  buPROPion (WELLBUTRIN XL) 300 MG 24 hr tablet Take 1 tablet (300 mg total) by mouth daily. Patient not taking: Reported on 05/12/2015 03/26/15   Aleksei Plotnikov V, MD   BP 180/90 mmHg  Pulse 86  Temp(Src) 97.8 F (36.6 C) (Oral)  Resp 16  SpO2 96% Physical Exam  Constitutional: She appears well-developed and well-nourished. No distress.  HENT:  Head: Normocephalic and atraumatic.  Eyes: Conjunctivae are normal. Right eye exhibits no discharge. Left eye exhibits no discharge.  Cardiovascular:  1+ DP and PT pulses 2 left ankle.  Pulmonary/Chest: Effort normal. No respiratory distress.  Musculoskeletal:  Left ankle: Tenderness to left medial malleolus with surrounding swelling without warmth or erythema. No ecchymoses. Full range of motion without tenderness. No tenderness to right proximal fibular head. No tenderness over base of fifth metatarsal or navicular. No tenderness of lateral malleolus.  Neurological: She is alert. Coordination normal.  Bilateral 5 strength in left ankle.  Skin: She is not diaphoretic.  Psychiatric: She has a normal mood and affect. Her behavior is normal.  Nursing note and vitals reviewed.   ED Course  Procedures (including critical care time) Labs Review Labs Reviewed - No data to  display  Imaging Review Dg Ankle Complete Left  05/12/2015   CLINICAL DATA:  Left ankle pain and swelling, twisted ankle last night going to the bathroom  EXAM: LEFT ANKLE COMPLETE - 3+ VIEW  COMPARISON:  None.  FINDINGS: Three views of the left ankle submitted. No acute fracture or subluxation. No radiopaque foreign body. Ankle mortise is preserved. There is plantar spur of calcaneus. Mild lateral soft tissue swelling.  IMPRESSION: No acute fracture or subluxation. Lateral soft tissue swelling. Plantar spur of calcaneus.   Electronically Signed   By: Lahoma Crocker M.D.   On: 05/12/2015 12:39     EKG Interpretation None      MDM   Final diagnoses:  Left ankle injury, initial encounter   Patient X-Ray negative for obvious fracture or dislocation. Pain managed in ED. Pt without erythema, edema or warmth to joint. Neurovascularly intact. I doubt septic arthritis. Pt with ankle sprain. Pt advised to follow up with PCP/orthopedics if symptoms persist. Patient given ASO while in ED, RICE, and conservative therapy recommended and discussed.   Discussed return precautions with patient. Discussed all results and patient verbalizes understanding and agrees with plan.       Al Corpus, PA-C 05/12/15 1246  Veryl Speak, MD 05/12/15 1600

## 2015-05-12 NOTE — Discharge Instructions (Signed)
Return to the emergency room with worsening of symptoms, new symptoms or with symptoms that are concerning, especially redness, worsening swelling, unable to move ankle, fevers, numbness tingling or weakness. RICE: Rest, Ice (three cycles of 20 mins on, 49mins off at least twice a day), compression/brace, elevation. Heating pad works well for back pain. Follow up with PCP/orthopedist if symptoms worsen or are persistent. Read below information and follow recommendations. Ankle Sprain An ankle sprain is an injury to the strong, fibrous tissues (ligaments) that hold the bones of your ankle joint together.  CAUSES An ankle sprain is usually caused by a fall or by twisting your ankle. Ankle sprains most commonly occur when you step on the outer edge of your foot, and your ankle turns inward. People who participate in sports are more prone to these types of injuries.  SYMPTOMS   Pain in your ankle. The pain may be present at rest or only when you are trying to stand or walk.  Swelling.  Bruising. Bruising may develop immediately or within 1 to 2 days after your injury.  Difficulty standing or walking, particularly when turning corners or changing directions. DIAGNOSIS  Your caregiver will ask you details about your injury and perform a physical exam of your ankle to determine if you have an ankle sprain. During the physical exam, your caregiver will press on and apply pressure to specific areas of your foot and ankle. Your caregiver will try to move your ankle in certain ways. An X-ray exam may be done to be sure a bone was not broken or a ligament did not separate from one of the bones in your ankle (avulsion fracture).  TREATMENT  Certain types of braces can help stabilize your ankle. Your caregiver can make a recommendation for this. Your caregiver may recommend the use of medicine for pain. If your sprain is severe, your caregiver may refer you to a surgeon who helps to restore function to parts  of your skeletal system (orthopedist) or a physical therapist. [place] ice to your injury for 1-2 days or as directed by your caregiver. Applying ice helps to reduce inflammation and pain.  Put ice in a plastic bag.  Place a towel between your skin and the bag.  Leave the ice on for 15-20 minutes at a time, every 2 hours while you are awake.  Only take over-the-counter or prescription medicines for pain, discomfort, or fever as directed by your caregiver.  Elevate your injured ankle above the level of your heart as much as possible for 2-3 days.  If your caregiver recommends crutches, use them as instructed. Gradually put weight on the affected ankle. Continue to use crutches or a cane until you can walk without feeling pain in your ankle.  If you have a plaster splint, wear the splint as directed by your caregiver. Do not rest it on anything harder than a pillow for the first 24 hours. Do not put weight on it. Do not get it wet. You may take it off to take a shower or bath.  You may have been given an elastic bandage to wear around your ankle to provide support. If the elastic bandage is too tight (you have numbness or tingling in your foot or your foot becomes cold and blue), adjust the bandage to make it comfortable.  If you have an air splint, you may blow more air into it or let air out to make it more comfortable. You may take  your splint off at night and before taking a shower or bath. Wiggle your toes in the splint several times per day to decrease swelling. SEEK MEDICAL CARE IF:   You have rapidly increasing bruising or swelling.  Your toes feel extremely cold or you lose feeling in your foot.  Your pain is not relieved with medicine. SEEK IMMEDIATE MEDICAL CARE IF:  Your toes are numb or blue.  You have severe pain that is increasing. MAKE SURE YOU:   Understand these instructions.  Will watch your condition.  Will get help right away if you  are not doing well or get worse. Document Released: 11/29/2005 Document Revised: 08/23/2012 Document Reviewed: 12/11/2011 [facility] Patient Information 2015 [place], [place]. This information is not intended to replace advice given to you by your health care provider. Make sure you discuss any questions you have with your health care provider.

## 2015-05-12 NOTE — ED Notes (Signed)
Pt c/o L ankle pain and swelling after "twisting" her ankle in the middle of the night last night while going to the bathroom. Pt ambulatory, denies numbness and tingling, has good ROM in foot and ankle with pain. Pt A&Ox4.  Pt is a chronic pain pt, sts she had an oxycontin 15mg  around 11am this morning. Swelling noted to L lateral ankle.

## 2015-05-12 NOTE — Progress Notes (Signed)
Orthopedic Tech Progress Note Patient Details:  LAROYA RENNERT 1951-06-21 [id number]  Ortho Devices Type of Ortho Device: ASO Ortho Device/Splint Interventions: Application   Cammer, Theodoro Parma 05/12/2015, 1:03 PM

## 2015-05-21 ENCOUNTER — Other Ambulatory Visit (INDEPENDENT_AMBULATORY_CARE_PROVIDER_SITE_OTHER): Payer: 59

## 2015-05-21 DIAGNOSIS — E119 Type 2 diabetes mellitus without complications: Secondary | ICD-10-CM

## 2015-05-21 LAB — BASIC METABOLIC PANEL
BUN: 28 mg/dL — ABNORMAL HIGH (ref 6–23)
CO2: 29 mEq/L (ref 19–32)
Calcium: 9.4 mg/dL (ref 8.4–10.5)
Chloride: 99 mEq/L (ref 96–112)
Creatinine, Ser: 1.29 mg/dL — ABNORMAL HIGH (ref 0.40–1.20)
GFR: 44.32 mL/min — ABNORMAL LOW (ref >60.00)
Glucose, Bld: 136 mg/dL — ABNORMAL HIGH (ref 70–99)
Potassium: 4.6 mEq/L (ref 3.5–5.1)
Sodium: 134 mEq/L — ABNORMAL LOW (ref 135–145)

## 2015-05-21 LAB — HEMOGLOBIN A1C: Hgb A1c MFr Bld: 7 % — ABNORMAL HIGH (ref 4.6–6.5)

## 2015-05-22 ENCOUNTER — Telehealth: Payer: Self-pay | Admitting: Physical Therapy

## 2015-05-22 NOTE — Telephone Encounter (Signed)
-----   Message from Magda Paganini, [place] sent at 05/22/2015  1:11 PM EDT ----- Patient would love for one of you to give her a call.                                                               Thanks

## 2015-05-22 NOTE — Telephone Encounter (Signed)
She reported that she twisted her ankle and fell on memorial weekend.  She wanted to ask if there was anything that could be done for it in physical therapy.  I told her we could work on it if she got a new referral from her physician.  Patient reports she sees the dr in the next 2 weeks and will request to get a new script for PT for her ankle.

## 2015-05-26 ENCOUNTER — Encounter: Payer: Self-pay | Admitting: Internal Medicine

## 2015-05-26 ENCOUNTER — Ambulatory Visit (INDEPENDENT_AMBULATORY_CARE_PROVIDER_SITE_OTHER): Payer: 59 | Admitting: Internal Medicine

## 2015-05-26 VITALS — BP 140/78 | HR 84 | Wt 241.0 lb

## 2015-05-26 DIAGNOSIS — M5440 Lumbago with sciatica, unspecified side: Secondary | ICD-10-CM | POA: Diagnosis not present

## 2015-05-26 DIAGNOSIS — I10 Essential (primary) hypertension: Secondary | ICD-10-CM

## 2015-05-26 DIAGNOSIS — E119 Type 2 diabetes mellitus without complications: Secondary | ICD-10-CM | POA: Diagnosis not present

## 2015-05-26 MED ORDER — GLIMEPIRIDE 1 MG PO TABS: ORAL_TABLET | ORAL | Status: DC

## 2015-05-26 MED ORDER — OXYCODONE HCL 15 MG PO TABS: 15.0000 mg | ORAL_TABLET | Freq: Four times a day (QID) | ORAL | Status: DC | PRN

## 2015-05-26 MED ORDER — OXYCODONE HCL ER 20 MG PO T12A: 20.0000 mg | EXTENDED_RELEASE_TABLET | Freq: Two times a day (BID) | ORAL | Status: DC

## 2015-05-26 MED ORDER — FUROSEMIDE 40 MG PO TABS: ORAL_TABLET | ORAL | Status: DC

## 2015-05-26 MED ORDER — CEVIMELINE HCL 30 MG PO CAPS: ORAL_CAPSULE | ORAL | Status: DC

## 2015-05-26 MED ORDER — OMEPRAZOLE 20 MG PO CPDR: 20.0000 mg | DELAYED_RELEASE_CAPSULE | Freq: Every day | ORAL | Status: DC

## 2015-05-26 NOTE — Progress Notes (Signed)
Pre visit review using our clinic review tool, if applicable. No additional management support is needed unless otherwise documented below in the visit note. 

## 2015-05-26 NOTE — Assessment & Plan Note (Signed)
Chronic and severe. Chronic opioid use. Oxycontin and Roxycodone.  Potential benefits of a long term opioids use as well as potential risks (i.e. addiction risk, apnea etc) and complications (i.e. Somnolence, constipation and others) were explained to the patient and were aknowledged.   

## 2015-05-26 NOTE — Assessment & Plan Note (Addendum)
Glimeperide, Metformin, Actos 

## 2015-05-26 NOTE — Progress Notes (Signed)
   Subjective:     HPI  Pt is s/p R knee TKR re-do 3 mo ago (Dr Wynelle Link) - used more Roxycodone post-op - out Retired Dec 16, 2014 C/o L foot pain - twisted ankle  The patient presents for a follow-up of  chronic hypertension, chronic dyslipidemia, type 2 diabetes controlled with medicines. F/u chronic LBP and B knee pain. F/u B LE swelling (has had it before, used compr hose)   BP Readings from Last 3 Encounters:  05/26/15 140/78  05/12/15 129/78  04/15/15 118/82   Wt Readings from Last 3 Encounters:  05/26/15 241 lb (109.317 kg)  04/15/15 231 lb (104.781 kg)  03/26/15 240 lb (108.863 kg)     Review of Systems  Constitutional: Positive for fatigue. Negative for chills, activity change, appetite change and unexpected weight change.  HENT: Negative for congestion, mouth sores and sinus pressure.   Eyes: Negative for visual disturbance.  Respiratory: Negative for cough and chest tightness.   Genitourinary: Negative for frequency, difficulty urinating and vaginal pain.  Musculoskeletal: Positive for back pain, arthralgias and gait problem.  Skin: Negative for pallor and rash.  Neurological: Negative for dizziness, tremors and weakness.  Psychiatric/Behavioral: Positive for sleep disturbance and dysphoric mood. Negative for suicidal ideas and confusion. The patient is nervous/anxious.          Objective:   Physical Exam  Constitutional: She appears well-developed. No distress.  obese  HENT:  Head: Normocephalic.  Right Ear: External ear normal.  Left Ear: External ear normal.  Nose: Nose normal.  Mouth/Throat: Oropharynx is clear and moist.  Eyes: Conjunctivae are normal. Pupils are equal, round, and reactive to light. Right eye exhibits no discharge. Left eye exhibits no discharge.  Neck: Normal range of motion. Neck supple. No JVD present. No tracheal deviation present. No thyromegaly present.  Cardiovascular: Normal rate, regular rhythm and normal heart sounds.    Pulmonary/Chest: No stridor. No respiratory distress. She has no wheezes.  Abdominal: Soft. Bowel sounds are normal. She exhibits no distension and no mass. There is no tenderness. There is no rebound and no guarding.  Musculoskeletal: She exhibits edema (trace) and tenderness.  Lymphadenopathy:    She has no cervical adenopathy.  Neurological: She displays normal reflexes. No cranial nerve deficit. She exhibits normal muscle tone. Coordination normal.  Skin: No rash noted. No erythema.  Psychiatric: Her behavior is normal. Judgment and thought content normal.  R knee is w/a healing scar L ankle in a brace   Lab Results  Component Value Date   WBC 14.4* 03/07/2015   HGB 9.2* 03/07/2015   HCT 28.4* 03/07/2015   PLT 252 03/07/2015   GLUCOSE 136* 05/21/2015   CHOL 215* 09/25/2014   TRIG 154.0* 09/25/2014   HDL 46.00 09/25/2014   LDLCALC 138* 09/25/2014   ALT 17 02/26/2015   AST 18 02/26/2015   NA 134* 05/21/2015   K 4.6 05/21/2015   CL 99 05/21/2015   CREATININE 1.29* 05/21/2015   BUN 28* 05/21/2015   CO2 29 05/21/2015   TSH 1.64 07/25/2013   INR 1.04 02/26/2015   HGBA1C 7.0* 05/21/2015         Assessment & Plan:

## 2015-05-26 NOTE — Assessment & Plan Note (Signed)
On Coreg 

## 2015-06-09 ENCOUNTER — Other Ambulatory Visit: Payer: Self-pay

## 2015-07-22 ENCOUNTER — Encounter: Payer: Self-pay | Admitting: Pharmacist

## 2015-07-22 ENCOUNTER — Ambulatory Visit (INDEPENDENT_AMBULATORY_CARE_PROVIDER_SITE_OTHER): Payer: Self-pay | Admitting: Family Medicine

## 2015-07-22 ENCOUNTER — Ambulatory Visit: Payer: 59 | Admitting: Pharmacist

## 2015-07-22 VITALS — BP 142/86 | Wt 244.0 lb

## 2015-07-22 DIAGNOSIS — E118 Type 2 diabetes mellitus with unspecified complications: Secondary | ICD-10-CM

## 2015-07-22 NOTE — Patient Instructions (Signed)
1)  Continue to make healthy diet choices and monitor portion sizes 2)  Attempt to do only mild exercise especially while ankle continues to heal. 3)  Keep appt with [name] and ask for 2 month supply of pain medications 4)  Continue testing 1-3 times daily, great job with improvement in this area! 5)  Great job with improved A1c!  Great to see you today!

## 2015-07-22 NOTE — Progress Notes (Signed)
Subjective:  Patient is a 64 yo female with type 2 diabetes who presents today for 3 month follow-up as part of the employer-sponsored [facility] program. Current diabetes regimen includes Metformin, glimepiride, and pioglitazone. Patient does not take daily ASA, ACEi, or statin.  Most recent MD follow-up was June with [name].  Patient has a pending appt for this month for follow-up. No med changes or major health changes at this time.  Patient continues tolerating pioglitazone well (started in April).  Of note, patient continues healing well after knee replacement, but recently suffered a fall injuring her ankle.  Ankle is healing, but slowly and still causing a considerable amount of pain and some swelling.  Patient is very discouraged by this.  Was prescribed a prednisone course.   Diabetes Assessment:  No changes to diabetes regimen at this time. Patient does maintain good medication compliance. Most recent A1c was 7.0% which is at goal of less than 7%, and improved since last reading of 7.4%.  Weight has increased by 3 lb since last visit.  Patient did bring meter today and is currently testing 2-3 times per day. Glucose monitoring occurs fasting, before meals and when symptomatic.  This is a huge improvement, as patient was not testing at all in the past or only sporadically.  Glucose readings were elevated during prednisone course, up to 300.  Hypoglycemia is rare. Patient does demonstrate appropriate correction of hypoglycemia.  Patient reports signs and symptoms of neuropathy including numbness/tingling/burning.  Of note, patient attempted to taper off duloxetine as she doubted that it made an difference in her neuropathy.  However, she was not able to taper off completely before symptoms worsened and she decided to resume at previous dose.  No symptoms of foot infection.  Patient is up to date on eye and dental exam.       Lifestyle Assessment:  Diet - no major changes, pt reports  portions have decreased just because she has been less active due to ankle injury.   She also is attempting to control glucose more with dietary changes and has attempted to better regulate portions as a result.     Exercise - No routine exercise at this time due to ankle injury.  Patient did walk the [employer] parking lot for approximately 30 minutes and this caused ankle swelling and pain.  She continues to have a gym membership where she would like to use the pool for low impact exercise but she admits to significant anxiety associated with going to the gym.     Plan and Goals: 1)  Continue to make healthy diet choices and monitor portion sizes 2)  Attempt to do only mild exercise especially while ankle continues to heal. 3)  Keep appt with [name] and ask for 2 month supply of pain medications 4)  Continue testing 1-3 times daily, great job with improvement in this area! 5)  Great job with improved A1c!  Tilman Neat, PharmD [facility]  [phone]

## 2015-07-31 ENCOUNTER — Ambulatory Visit (INDEPENDENT_AMBULATORY_CARE_PROVIDER_SITE_OTHER): Payer: 59 | Admitting: Internal Medicine

## 2015-07-31 ENCOUNTER — Other Ambulatory Visit: Payer: 59

## 2015-07-31 ENCOUNTER — Encounter: Payer: Self-pay | Admitting: Internal Medicine

## 2015-07-31 VITALS — BP 140/78 | HR 84 | Wt 244.0 lb

## 2015-07-31 DIAGNOSIS — I872 Venous insufficiency (chronic) (peripheral): Secondary | ICD-10-CM

## 2015-07-31 DIAGNOSIS — E119 Type 2 diabetes mellitus without complications: Secondary | ICD-10-CM

## 2015-07-31 DIAGNOSIS — M170 Bilateral primary osteoarthritis of knee: Secondary | ICD-10-CM

## 2015-07-31 DIAGNOSIS — I10 Essential (primary) hypertension: Secondary | ICD-10-CM | POA: Diagnosis not present

## 2015-07-31 MED ORDER — OXYCODONE HCL ER 20 MG PO T12A: 20.0000 mg | EXTENDED_RELEASE_TABLET | Freq: Two times a day (BID) | ORAL | Status: DC

## 2015-07-31 MED ORDER — OXYCODONE HCL 15 MG PO TABS: 15.0000 mg | ORAL_TABLET | Freq: Four times a day (QID) | ORAL | Status: DC | PRN

## 2015-07-31 MED ORDER — METFORMIN HCL 1000 MG PO TABS: 1000.0000 mg | ORAL_TABLET | Freq: Two times a day (BID) | ORAL | Status: DC

## 2015-07-31 MED ORDER — AMOXICILLIN 500 MG PO CAPS: ORAL_CAPSULE | ORAL | Status: DC

## 2015-07-31 NOTE — Assessment & Plan Note (Signed)
Compression full thigh hose

## 2015-07-31 NOTE — Progress Notes (Signed)
Pre visit review using our clinic review tool, if applicable. No additional management support is needed unless otherwise documented below in the visit note. 

## 2015-07-31 NOTE — Assessment & Plan Note (Signed)
Long standing Glimeperide, Metformin, Actos

## 2015-07-31 NOTE — Assessment & Plan Note (Signed)
S/p B TKR Oxycod and Oxycont  Potential benefits of a long term opioids use as well as potential risks (i.e. addiction risk, apnea etc) and complications (i.e. Somnolence, constipation and others) were explained to the patient and were aknowledged.

## 2015-07-31 NOTE — Assessment & Plan Note (Signed)
On Coreg 

## 2015-07-31 NOTE — Progress Notes (Signed)
Subjective:  Patient ID: [name], female    DOB: 1951-06-08  Age: 64 y.o. MRN: [id number]  CC: No chief complaint on file.   HPI [name] presents for chronic LBP, knee OA, DM f/u. Pt is leaving for [place] soon  C/o L leg pain and swelling after twisting it on memorial day this summer - pt is seeing Dr Melanee Spry - ankle was X rayed 3 times. Pt took Prednisone  Outpatient Prescriptions Prior to Visit  Medication Sig Dispense Refill  . buPROPion (WELLBUTRIN XL) 150 MG 24 hr tablet Take 150 mg by mouth 2 (two) times daily.    Marland Kitchen buPROPion (WELLBUTRIN XL) 300 MG 24 hr tablet Take 1 tablet (300 mg total) by mouth daily. 90 tablet 3  . carvedilol (COREG) 25 MG tablet Take 1 tablet (25 mg total) by mouth 2 (two) times daily with a meal. 180 tablet 3  . cevimeline (EVOXAC) 30 MG capsule 2 in the morning and 1 in the evening 270 capsule 3  . cyclobenzaprine (FLEXERIL) 10 MG tablet Take 3 tablets (30 mg total) by mouth at bedtime. (Patient taking differently: Take 20 mg by mouth at bedtime. ) 270 tablet 3  . DULoxetine (CYMBALTA) 60 MG capsule Take 1 capsule (60 mg total) by mouth at bedtime. 90 capsule 3  . furosemide (LASIX) 40 MG tablet TAKE 1/2 TO 1 TABLET BY MOUTH ONCE DAILY AS NEEDED FOR SWELLING 90 tablet 3  . glimepiride (AMARYL) 1 MG tablet TAKE 1 TABLET BY MOUTH DAILY BEFORE BREAKFAST. 90 tablet 3  . glucose blood (ONE TOUCH TEST STRIPS) test strip Use as instructed 50 each 3  . ibuprofen (ADVIL,MOTRIN) 200 MG tablet Take 800 mg by mouth 2 (two) times daily as needed (For pain.).    Marland Kitchen Lancets (ONETOUCH ULTRASOFT) lancets Use as instructed 100 each 3  . lidocaine (LIDODERM) 5 % Place 1-3 patches onto the skin daily as needed (Pain). (Patient taking differently: Place 1-3 patches onto the skin daily as needed (For pain.). ) 60 patch 4  . LORazepam (ATIVAN) 2 MG tablet Take 1 mg by mouth every 6 (six) hours as needed for anxiety.    . LUTEIN PO Take 1 tablet by mouth at bedtime.    .  metFORMIN (GLUCOPHAGE) 1000 MG tablet Take 1,000 mg by mouth 2 (two) times daily with a meal.    . Multiple Vitamin (MULTIVITAMIN WITH MINERALS) TABS tablet Take 1 tablet by mouth daily.    . Omega-3 300 MG CAPS Take 300 mg by mouth at bedtime.    Marland Kitchen omeprazole (PRILOSEC) 20 MG capsule Take 1 capsule (20 mg total) by mouth daily. 90 capsule 3  . oxybutynin (DITROPAN) 5 MG tablet Take 0.5 tablets (2.5 mg total) by mouth daily. 45 tablet 3  . OxyCODONE (OXYCONTIN) 20 mg T12A 12 hr tablet Take 1 tablet (20 mg total) by mouth every 12 (twelve) hours. Please fill on or after 07/26/15 60 tablet 0  . oxyCODONE (ROXICODONE) 15 MG immediate release tablet Take 1 tablet (15 mg total) by mouth every 6 (six) hours as needed. Please fill on or after 07/26/15 120 tablet 0  . pioglitazone (ACTOS) 15 MG tablet Take 1 tablet (15 mg total) by mouth daily. 90 tablet 3  . polyvinyl alcohol (LIQUIFILM TEARS) 1.4 % ophthalmic solution Place 1 drop into both eyes daily as needed for dry eyes.    . PSYLLIUM PO Take 1 capsule by mouth 2 (two) times daily.    Marland Kitchen  Vitamin D, Cholecalciferol, 1000 UNITS TABS Take 1,000 Units by mouth daily.     No facility-administered medications prior to visit.    ROS Review of Systems  Constitutional: Negative for chills, activity change, appetite change, fatigue and unexpected weight change.  HENT: Negative for congestion, mouth sores and sinus pressure.   Eyes: Negative for visual disturbance.  Respiratory: Negative for cough and chest tightness.   Cardiovascular: Positive for leg swelling.  Gastrointestinal: Negative for nausea and abdominal pain.  Genitourinary: Negative for frequency, difficulty urinating and vaginal pain.  Musculoskeletal: Positive for back pain and arthralgias. Negative for gait problem.  Skin: Negative for pallor and rash.  Neurological: Negative for dizziness, tremors, weakness, numbness and headaches.  Psychiatric/Behavioral: Negative for suicidal ideas,  confusion, sleep disturbance and self-injury. The patient is nervous/anxious.     Objective:  BP 140/78 mmHg  Pulse 84  Wt 244 lb (110.678 kg)  SpO2 94%  BP Readings from Last 3 Encounters:  07/31/15 140/78  07/22/15 142/86  05/26/15 140/78    Wt Readings from Last 3 Encounters:  07/31/15 244 lb (110.678 kg)  07/22/15 244 lb (110.678 kg)  05/26/15 241 lb (109.317 kg)    Physical Exam  Constitutional: She appears well-developed. No distress.  HENT:  Head: Normocephalic.  Right Ear: External ear normal.  Left Ear: External ear normal.  Nose: Nose normal.  Mouth/Throat: Oropharynx is clear and moist.  Eyes: Conjunctivae are normal. Pupils are equal, round, and reactive to light. Right eye exhibits no discharge. Left eye exhibits no discharge.  Neck: Normal range of motion. Neck supple. No JVD present. No tracheal deviation present. No thyromegaly present.  Cardiovascular: Normal rate, regular rhythm and normal heart sounds.   Pulmonary/Chest: No stridor. No respiratory distress. She has no wheezes.  Abdominal: Soft. Bowel sounds are normal. She exhibits no distension and no mass. There is no tenderness. There is no rebound and no guarding.  Musculoskeletal: She exhibits edema and tenderness.  Lymphadenopathy:    She has no cervical adenopathy.  Neurological: She displays normal reflexes. No cranial nerve deficit. She exhibits normal muscle tone. Coordination normal.  Skin: No rash noted. No erythema.  Psychiatric: She has a normal mood and affect. Her behavior is normal. Judgment and thought content normal.  L ankle is swollen Knees, LS - tender Obese  Lab Results  Component Value Date   WBC 14.4* 03/07/2015   HGB 9.2* 03/07/2015   HCT 28.4* 03/07/2015   PLT 252 03/07/2015   GLUCOSE 136* 05/21/2015   CHOL 215* 09/25/2014   TRIG 154.0* 09/25/2014   HDL 46.00 09/25/2014   LDLCALC 138* 09/25/2014   ALT 17 02/26/2015   AST 18 02/26/2015   NA 134* 05/21/2015   K 4.6  05/21/2015   CL 99 05/21/2015   CREATININE 1.29* 05/21/2015   BUN 28* 05/21/2015   CO2 29 05/21/2015   TSH 1.64 07/25/2013   INR 1.04 02/26/2015   HGBA1C 7.0* 05/21/2015    Dg Ankle Complete Left  05/12/2015   CLINICAL DATA:  Left ankle pain and swelling, twisted ankle last night going to the bathroom  EXAM: LEFT ANKLE COMPLETE - 3+ VIEW  COMPARISON:  None.  FINDINGS: Three views of the left ankle submitted. No acute fracture or subluxation. No radiopaque foreign body. Ankle mortise is preserved. There is plantar spur of calcaneus. Mild lateral soft tissue swelling.  IMPRESSION: No acute fracture or subluxation. Lateral soft tissue swelling. Plantar spur of calcaneus.   Electronically Signed  By: Lahoma Crocker M.D.   On: 05/12/2015 12:39    Assessment & Plan:   There are no diagnoses linked to this encounter. I am having Ms. Greif maintain her onetouch ultrasoft, glucose blood, oxybutynin, polyvinyl alcohol, cyclobenzaprine, DULoxetine, lidocaine, carvedilol, pioglitazone, buPROPion, buPROPion, Vitamin D (Cholecalciferol), ibuprofen, LORazepam, LUTEIN PO, metFORMIN, multivitamin with minerals, Omega-3, PSYLLIUM PO, cevimeline, furosemide, glimepiride, omeprazole, oxyCODONE, and OxyCODONE.  No orders of the defined types were placed in this encounter.     Follow-up: No Follow-up on file.  [name], MD

## 2015-08-04 ENCOUNTER — Ambulatory Visit (INDEPENDENT_AMBULATORY_CARE_PROVIDER_SITE_OTHER): Payer: 59 | Admitting: Sports Medicine

## 2015-08-04 ENCOUNTER — Encounter: Payer: Self-pay | Admitting: Sports Medicine

## 2015-08-04 VITALS — BP 116/65 | HR 82 | Ht 63.0 in | Wt 244.0 lb

## 2015-08-04 DIAGNOSIS — M216X9 Other acquired deformities of unspecified foot: Secondary | ICD-10-CM | POA: Diagnosis not present

## 2015-08-04 NOTE — Progress Notes (Signed)
   Subjective:    Patient ID: [name], female    DOB: 1951-10-21, 64 y.o.   MRN: [id number]  HPI chief complaint: "I need new orthotics"  64 year old female comes in today for orthotics. She has had custom orthotics in the past. There are several years old. She has a history of bilateral total knee arthroplasties. She had to undergo a right knee revision 6 years after the initial surgery. She is referred to [place] by [facility] orth[facility]ics for custom orthotics. She does have a history of diabetic neuropathy but her A1c is very well controlled. She has a mild amount of residual left ankle pain from an injury she suffered several months ago. Otherwise denies any significant foot pain  Medical history reviewed Medications reviewed Allergies reviewed    Review of Systems     Objective:   Physical Exam Well-developed, well-nourished. No acute distress  Examination of each knee shows surgical incisions consistent with her prior total knee arthroplasties  Left ankle show some mild residual soft tissue swelling from her previous injury  Examination of her feet in the standing position shows pes planus with some transverse arch collapse. No significant callus spelled it. Good pulses. Good temperature. She does have some decreased sensation to light touch in both feet. No ulcers.       Assessment & Plan:  Pronation Diabetic neuropathy  Agreed to construct custom orthotics for this patient but I've instructed her to keep a close eye on her feet. I do not think her neuropathy is severe enough that she needs to have diabetic orthotics but I want her to keep a good close eye on the condition of her feet. She will let me know if she has any problems with her orthotics. Follow-up as needed.  Patient was fitted for a : standard, cushioned, semi-rigid orthotic. The orthotic was heated and afterward the patient stood on the orthotic blank positioned on the orthotic stand. The patient was  positioned in subtalar neutral position and 10 degrees of ankle dorsiflexion in a weight bearing stance. After completion of molding, a stable base was applied to the orthotic blank. The blank was ground to a stable position for weight bearing. Size: 7 Base: blue EVA Posting: none Additional orthotic padding: none  Patient found orthotics to be comfortable prior to leaving the office. Total time spent with the patient was 30 minutes with greater than 50% of the time spent in face-to-face consultation discussing orthotic construction, instruction, and fitting.

## 2015-08-19 ENCOUNTER — Ambulatory Visit: Payer: 59 | Admitting: Internal Medicine

## 2015-08-20 NOTE — Progress Notes (Signed)
ATTENDING PHYSICIAN NOTE: I have reviewed the chart and agree with the plan as detailed above. Blaire Neal MD Pager [phone]  

## 2015-09-15 ENCOUNTER — Other Ambulatory Visit: Payer: Self-pay | Admitting: *Deleted

## 2015-09-15 MED ORDER — BUPROPION HCL ER (XL) 150 MG PO TB24: 150.0000 mg | ORAL_TABLET | Freq: Two times a day (BID) | ORAL | Status: DC

## 2015-09-16 ENCOUNTER — Telehealth: Payer: Self-pay | Admitting: Internal Medicine

## 2015-09-16 NOTE — Telephone Encounter (Signed)
Pt called in and needs refill on her buPROPion (WELLBUTRIN XL) 150 MG 24 hr tablet [id number]   [facility]

## 2015-09-17 NOTE — Telephone Encounter (Signed)
Refill already sent on 09/15/15...Johny Chess

## 2015-09-19 ENCOUNTER — Ambulatory Visit (INDEPENDENT_AMBULATORY_CARE_PROVIDER_SITE_OTHER): Payer: 59

## 2015-09-19 DIAGNOSIS — Z23 Encounter for immunization: Secondary | ICD-10-CM

## 2015-09-23 ENCOUNTER — Other Ambulatory Visit (HOSPITAL_COMMUNITY): Payer: Self-pay | Admitting: Sports Medicine

## 2015-09-23 DIAGNOSIS — M25572 Pain in left ankle and joints of left foot: Secondary | ICD-10-CM

## 2015-09-24 ENCOUNTER — Telehealth: Payer: Self-pay | Admitting: *Deleted

## 2015-09-24 NOTE — Telephone Encounter (Signed)
Rf req for Lorazepam 2 mg 1/2 po q 6 hr prn. # 60. Last filled 07/30/15. Ok to Rf?

## 2015-09-25 ENCOUNTER — Ambulatory Visit (HOSPITAL_COMMUNITY)
Admission: RE | Admit: 2015-09-25 | Discharge: 2015-09-25 | Disposition: A | Discharge status: Home / Self Care | Payer: 59 | Source: Ambulatory Visit | Attending: Sports Medicine | Admitting: Sports Medicine

## 2015-09-25 DIAGNOSIS — M25572 Pain in left ankle and joints of left foot: Secondary | ICD-10-CM | POA: Diagnosis present

## 2015-09-25 DIAGNOSIS — M19072 Primary osteoarthritis, left ankle and foot: Secondary | ICD-10-CM | POA: Insufficient documentation

## 2015-09-25 DIAGNOSIS — M25472 Effusion, left ankle: Secondary | ICD-10-CM | POA: Diagnosis not present

## 2015-09-25 DIAGNOSIS — M84372A Stress fracture, left ankle, initial encounter for fracture: Secondary | ICD-10-CM | POA: Insufficient documentation

## 2015-09-25 DIAGNOSIS — X58XXXA Exposure to other specified factors, initial encounter: Secondary | ICD-10-CM | POA: Insufficient documentation

## 2015-09-25 MED ORDER — LORAZEPAM 2 MG PO TABS: 1.0000 mg | ORAL_TABLET | Freq: Four times a day (QID) | ORAL | Status: DC | PRN

## 2015-09-25 NOTE — Telephone Encounter (Signed)
OK to fill this prescription with additional refills x3 Thank you!  

## 2015-09-25 NOTE — Telephone Encounter (Signed)
Done

## 2015-10-21 ENCOUNTER — Ambulatory Visit: Payer: 59 | Admitting: Pharmacist

## 2015-10-22 ENCOUNTER — Encounter: Payer: Self-pay | Admitting: Pharmacist

## 2015-10-22 ENCOUNTER — Other Ambulatory Visit: Payer: Self-pay | Admitting: Internal Medicine

## 2015-10-22 ENCOUNTER — Ambulatory Visit (INDEPENDENT_AMBULATORY_CARE_PROVIDER_SITE_OTHER): Payer: Self-pay | Admitting: Family Medicine

## 2015-10-22 VITALS — BP 142/84 | Wt 244.0 lb

## 2015-10-22 DIAGNOSIS — E1169 Type 2 diabetes mellitus with other specified complication: Secondary | ICD-10-CM

## 2015-10-22 NOTE — Patient Instructions (Signed)
1)  Continue to make healthy diet choices and monitor portion sizes, especially over the holidays 2)  Attempt to do only mild exercise once heal is resolved 3)  Keep appt with [name] 4)  Resume testing glucose 5)  Follow-up in 3 months on Wednesday  Feb 15th @ 1:30 pm   Great to see you today!

## 2015-10-22 NOTE — Progress Notes (Signed)
Subjective:  Patient is a 64 yo female with type 2 diabetes who presents today for 3 month follow-up as part of the employer-sponsored [facility] program. Current diabetes regimen includes Metformin, glimepiride, and pioglitazone. Patient does not take daily ASA, ACEi, or statin.  Most recent MD follow-up was Sept with [name].  Patient has a pending appt for this month for follow-up. No med changes at this time.  Of note, pt has been diagnosed with a heal fracture and is now wearing a boot for healing.  She is getting relief and has follow-up soon to evaluate healing.    Diabetes Assessment:  No changes to diabetes regimen at this time. Patient does maintain good medication compliance. Most recent A1c was 7.0% which is at goal of less than 7%, and improved since last reading of 7.4%.  A1c will be drawn at upcoming MD appt. Weight has remained unchanged since last visit.  Patient did not bring meter today and is not currently testing. No hypoglycemia reported. Patient does demonstrate appropriate correction of hypoglycemia when needed.  Patient reports continued signs and symptoms of neuropathy including numbness/tingling/burning.  No symptoms of foot infection.  Patient is up to date on eye and dental exams.  Patient has traveled to [place] over the summer and did not feel there was time to test glucose.  Also, she has spent much of the summer/fall sitting or staying off injured foot which has caused diabetes care to deteriorate.     Lifestyle Assessment:  Diet - no major changes, pt reports portions have decreased just because she has been less active due to ankle injury.   She attempts to make healthy choices, but admits diet continues to be inconsistent and erratic with regard to food choices and meal scheduling.    Exercise - No routine exercise at this time due to heal injury. She did walk a lot during her summer trip to [place] but reports severe discomfort and slow pace due to heal  injury.   She continues to have a gym membership where she would like to use the pool for low impact exercise but she admits to significant anxiety associated with going to the gym.  She will attempt to resume exercise once heal injury is resolved and she no longer needs to wear boot.   Plan and Goals: 1)  Continue to make healthy diet choices and monitor portion sizes, especially over the holidays 2)  Attempt to do only mild exercise once heal is resolved 3)  Keep appt with [name] 4)  Resume testing glucose 5)  Follow-up in 3 months on Wednesday  Feb 15th @ 1:30 pm   Great to see you today!  Tilman Neat, PharmD [facility]  [phone]

## 2015-10-24 ENCOUNTER — Other Ambulatory Visit (INDEPENDENT_AMBULATORY_CARE_PROVIDER_SITE_OTHER): Payer: 59

## 2015-10-24 ENCOUNTER — Ambulatory Visit (INDEPENDENT_AMBULATORY_CARE_PROVIDER_SITE_OTHER): Payer: 59 | Admitting: Internal Medicine

## 2015-10-24 ENCOUNTER — Encounter: Payer: Self-pay | Admitting: Internal Medicine

## 2015-10-24 VITALS — BP 140/77 | HR 86 | Wt 255.0 lb

## 2015-10-24 DIAGNOSIS — E119 Type 2 diabetes mellitus without complications: Secondary | ICD-10-CM

## 2015-10-24 DIAGNOSIS — M25562 Pain in left knee: Secondary | ICD-10-CM

## 2015-10-24 DIAGNOSIS — I10 Essential (primary) hypertension: Secondary | ICD-10-CM

## 2015-10-24 DIAGNOSIS — M25561 Pain in right knee: Secondary | ICD-10-CM

## 2015-10-24 DIAGNOSIS — F32A Depression, unspecified: Secondary | ICD-10-CM

## 2015-10-24 DIAGNOSIS — R600 Localized edema: Secondary | ICD-10-CM

## 2015-10-24 DIAGNOSIS — F329 Major depressive disorder, single episode, unspecified: Secondary | ICD-10-CM | POA: Diagnosis not present

## 2015-10-24 LAB — BASIC METABOLIC PANEL
BUN: 48 mg/dL — ABNORMAL HIGH (ref 6–23)
CO2: 31 mEq/L (ref 19–32)
Calcium: 9.5 mg/dL (ref 8.4–10.5)
Chloride: 93 mEq/L — ABNORMAL LOW (ref 96–112)
Creatinine, Ser: 1.59 mg/dL — ABNORMAL HIGH (ref 0.40–1.20)
GFR: 34.77 mL/min — ABNORMAL LOW (ref >60.00)
Glucose, Bld: 160 mg/dL — ABNORMAL HIGH (ref 70–99)
Potassium: 4.1 mEq/L (ref 3.5–5.1)
Sodium: 134 mEq/L — ABNORMAL LOW (ref 135–145)

## 2015-10-24 LAB — HEMOGLOBIN A1C: Hgb A1c MFr Bld: 7 % — ABNORMAL HIGH (ref 4.6–6.5)

## 2015-10-24 MED ORDER — OXYCODONE HCL ER 20 MG PO T12A: 20.0000 mg | EXTENDED_RELEASE_TABLET | Freq: Two times a day (BID) | ORAL | Status: DC

## 2015-10-24 MED ORDER — OXYCODONE HCL 15 MG PO TABS: 15.0000 mg | ORAL_TABLET | Freq: Four times a day (QID) | ORAL | Status: DC | PRN

## 2015-10-24 NOTE — Progress Notes (Signed)
Subjective:  Patient ID: [name], female    DOB: 11-28-1951  Age: 64 y.o. MRN: [id number]  CC: No chief complaint on file.   HPI [name] presents for chronic knee pain, DM, HTN. Pt has a new L heel fx after a trip to [place]  Outpatient Prescriptions Prior to Visit  Medication Sig Dispense Refill  . amoxicillin (AMOXIL) 500 MG capsule Take 4 by mouth one hour prior to dental procedure/cleaning. 4 capsule 3  . buPROPion (WELLBUTRIN XL) 150 MG 24 hr tablet Take 1 tablet (150 mg total) by mouth 2 (two) times daily. 180 tablet 3  . carvedilol (COREG) 25 MG tablet Take 1 tablet (25 mg total) by mouth 2 (two) times daily with a meal. 180 tablet 3  . cevimeline (EVOXAC) 30 MG capsule 2 in the morning and 1 in the evening 270 capsule 3  . cyclobenzaprine (FLEXERIL) 10 MG tablet Take 3 tablets (30 mg total) by mouth at bedtime. (Patient taking differently: Take 20 mg by mouth at bedtime. ) 270 tablet 3  . DULoxetine (CYMBALTA) 60 MG capsule Take 1 capsule (60 mg total) by mouth at bedtime. 90 capsule 3  . estradiol (ESTRACE) 0.5 MG tablet Take 0.5 mg by mouth daily.    . furosemide (LASIX) 40 MG tablet TAKE 1/2 TO 1 TABLET BY MOUTH ONCE DAILY AS NEEDED FOR SWELLING 90 tablet 3  . glimepiride (AMARYL) 1 MG tablet TAKE 1 TABLET BY MOUTH DAILY BEFORE BREAKFAST. 90 tablet 3  . glucose blood (ONE TOUCH TEST STRIPS) test strip Use as instructed 50 each 3  . ibuprofen (ADVIL,MOTRIN) 200 MG tablet Take 800 mg by mouth 2 (two) times daily as needed (For pain.).    Marland Kitchen Lancets (ONETOUCH ULTRASOFT) lancets Use as instructed 100 each 3  . lidocaine (LIDODERM) 5 % Place 1-3 patches onto the skin daily as needed (Pain). (Patient taking differently: Place 1-3 patches onto the skin daily as needed (For pain.). ) 60 patch 4  . LORazepam (ATIVAN) 2 MG tablet Take 0.5 tablets (1 mg total) by mouth every 6 (six) hours as needed for anxiety. 60 tablet 3  . LUTEIN PO Take 1 tablet by mouth at bedtime.    .  medroxyPROGESTERone (PROVERA) 2.5 MG tablet Take 2.5 mg by mouth daily.    . metFORMIN (GLUCOPHAGE) 1000 MG tablet Take 1 tablet (1,000 mg total) by mouth 2 (two) times daily with a meal. 180 tablet 3  . Multiple Vitamin (MULTIVITAMIN WITH MINERALS) TABS tablet Take 1 tablet by mouth daily.    . Omega-3 300 MG CAPS Take 300 mg by mouth at bedtime.    Marland Kitchen omeprazole (PRILOSEC) 20 MG capsule Take 1 capsule (20 mg total) by mouth daily. 90 capsule 3  . oxybutynin (DITROPAN) 5 MG tablet TAKE 1/2 TABLET BY MOUTH DAILY 45 tablet 3  . pioglitazone (ACTOS) 15 MG tablet Take 1 tablet (15 mg total) by mouth daily. 90 tablet 3  . polyvinyl alcohol (LIQUIFILM TEARS) 1.4 % ophthalmic solution Place 1 drop into both eyes daily as needed for dry eyes.    . PSYLLIUM PO Take 1 capsule by mouth 2 (two) times daily.    . Vitamin D, Cholecalciferol, 1000 UNITS TABS Take 1,000 Units by mouth daily.    . OxyCODONE (OXYCONTIN) 20 mg T12A 12 hr tablet Take 1 tablet (20 mg total) by mouth every 12 (twelve) hours. Please fill on or after 09/30/15 60 tablet 0  . oxyCODONE (ROXICODONE) 15  MG immediate release tablet Take 1 tablet (15 mg total) by mouth every 6 (six) hours as needed. Please fill on or after 09/30/15 120 tablet 0   No facility-administered medications prior to visit.    ROS Review of Systems  Constitutional: Positive for fatigue. Negative for chills, activity change, appetite change and unexpected weight change.  HENT: Negative for congestion, mouth sores and sinus pressure.   Eyes: Negative for visual disturbance.  Respiratory: Negative for cough and chest tightness.   Gastrointestinal: Negative for nausea and abdominal pain.  Genitourinary: Negative for frequency, difficulty urinating and vaginal pain.  Musculoskeletal: Positive for back pain, arthralgias and gait problem.  Skin: Negative for pallor and rash.  Neurological: Negative for dizziness, tremors, weakness, numbness and headaches.    Psychiatric/Behavioral: Negative for suicidal ideas, confusion and sleep disturbance. The patient is nervous/anxious.     Objective:  BP 140/77 mmHg  Pulse 86  Wt 255 lb (115.667 kg)  SpO2 95%  BP Readings from Last 3 Encounters:  10/24/15 140/77  10/22/15 142/84  08/04/15 116/65    Wt Readings from Last 3 Encounters:  10/24/15 255 lb (115.667 kg)  10/22/15 244 lb (110.678 kg)  08/04/15 244 lb (110.678 kg)    Physical Exam  Constitutional: She appears well-developed. No distress.  HENT:  Head: Normocephalic.  Right Ear: External ear normal.  Left Ear: External ear normal.  Nose: Nose normal.  Mouth/Throat: Oropharynx is clear and moist.  Eyes: Conjunctivae are normal. Pupils are equal, round, and reactive to light. Right eye exhibits no discharge. Left eye exhibits no discharge.  Neck: Normal range of motion. Neck supple. No JVD present. No tracheal deviation present. No thyromegaly present.  Cardiovascular: Normal rate, regular rhythm and normal heart sounds.   Pulmonary/Chest: No stridor. No respiratory distress. She has no wheezes.  Abdominal: Soft. Bowel sounds are normal. She exhibits no distension and no mass. There is no tenderness. There is no rebound and no guarding.  Musculoskeletal: She exhibits no edema or tenderness.  Lymphadenopathy:    She has no cervical adenopathy.  Neurological: She displays normal reflexes. No cranial nerve deficit. She exhibits normal muscle tone. Coordination normal.  Skin: No rash noted. No erythema.  Psychiatric: She has a normal mood and affect. Her behavior is normal. Judgment and thought content normal.  LLE in a UNA boot  Lab Results  Component Value Date   WBC 14.4* 03/07/2015   HGB 9.2* 03/07/2015   HCT 28.4* 03/07/2015   PLT 252 03/07/2015   GLUCOSE 160* 10/24/2015   CHOL 215* 09/25/2014   TRIG 154.0* 09/25/2014   HDL 46.00 09/25/2014   LDLCALC 138* 09/25/2014   ALT 17 02/26/2015   AST 18 02/26/2015   NA 134*  10/24/2015   K 4.1 10/24/2015   CL 93* 10/24/2015   CREATININE 1.59* 10/24/2015   BUN 48* 10/24/2015   CO2 31 10/24/2015   TSH 1.64 07/25/2013   INR 1.04 02/26/2015   HGBA1C 7.0* 10/24/2015    Mr Ankle Left  Wo Contrast  09/25/2015  CLINICAL DATA:  Persistent pain and swelling after a twisting injury 5 months ago. EXAM: MRI OF THE LEFT ANKLE WITHOUT CONTRAST TECHNIQUE: Multiplanar, multisequence MR imaging of the ankle was performed. No intravenous contrast was administered. COMPARISON:  Radiographs dated 05/12/2015 FINDINGS: TENDONS Peroneal: There is tenosynovitis of the tendons. The tendons are intact. 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 At the  level of the ankle joint the tendon is almost completely disrupted with only a small number of intact fibers. The distal tendon is severely degenerated to the insertion on the navicular. The more distal component of the tendon is normal. The patient does have slight tenosynovitis of the flexor hallucis longus and flexor digitorum longus tendons. Anterior: Normal. Achilles: Slight degenerative changes of the distal Achilles tendon. Small amount of fluid in the retrocalcaneal bursa with fluid in Kager's fat pad. Plantar Fascia: Prominent plantar calcaneal spur. Chronic degenerative changes of the plantar fascia with no evidence of active inflammation. LIGAMENTS Lateral: Normal. Medial: Normal. CARTILAGE Ankle Joint: Prominent ankle joint effusion. Subtalar Joints/Sinus Tarsi: No effusion.  Edema in the sinus tarsi. Bones: The patient has what is probably a stress fracture of the distal calcaneus at the articulation with the cuboid. There is intense edema in the distal calcaneus. There is also intense edema in the lateral process of the adjacent talus. This is probably a stress reaction. There is edema in the muscles overlying the cuboid and the lateral aspect of the  navicular. Focal arthritic changes in the midfoot primarily at the articulation of the navicular with the medial cuneiform and at the second and third tarsometatarsal joints. IMPRESSION: 1. Subcortical fracture of the distal calcaneus at the cuboid consistent with a stress fracture. Adjacent stress reaction in the lateral process of the talus. 2. 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 The tear is at the level of the medial malleolus. 3. Slight tenosynovitis of the peroneal tendons and of flexor hallucis longus and extensor digitorum longus tendons. 4. Moderate ankle joint effusion which may represent posttraumatic synovitis. Electronically Signed   By: Lorriane Shire M.D.   On: 09/25/2015 15:00    Assessment & Plan:   Diagnoses and all orders for this visit:  Controlled type 2 diabetes mellitus without complication, without long-term current use of insulin ([name]) -     Basic metabolic panel; Future -     Hemoglobin A1c; Future  Essential hypertension  Depression  Localized edema  Arthralgia of both lower legs  Other orders -     Discontinue: oxyCODONE (OXYCONTIN) 20 mg 12 hr tablet; Take 1 tablet (20 mg total) by mouth every 12 (twelve) hours. Please fill on or after 10/31/15 -     Discontinue: oxyCODONE (ROXICODONE) 15 MG immediate release tablet; Take 1 tablet (15 mg total) by mouth every 6 (six) hours as needed. Please fill on or after 10/31/15 -     Discontinue: oxyCODONE (OXYCONTIN) 20 mg 12 hr tablet; Take 1 tablet (20 mg total) by mouth every 12 (twelve) hours. Please fill on or after 11/30/15 -     Discontinue: oxyCODONE (ROXICODONE) 15 MG immediate release tablet; Take 1 tablet (15 mg total) by mouth every 6 (six) hours as needed. Please fill on or after 11/30/15 -     oxyCODONE (OXYCONTIN) 20 mg 12 hr tablet; Take 1 tablet (20 mg total) by mouth every 12 (twelve) hours. Please fill on or after 12/31/15 -     oxyCODONE  (ROXICODONE) 15 MG immediate release tablet; Take 1 tablet (15 mg total) by mouth every 6 (six) hours as needed. Please fill on or after 12/31/15   I have discontinued [name]'s oxyCODONE, oxyCODONE, oxyCODONE, and oxyCODONE. I have also changed her oxyCODONE and oxyCODONE. Additionally, I am having her maintain her onetouch ultrasoft, glucose blood, polyvinyl alcohol, cyclobenzaprine, DULoxetine, lidocaine, carvedilol, pioglitazone, Vitamin D (Cholecalciferol), ibuprofen, LUTEIN PO,  multivitamin with minerals, Omega-3, PSYLLIUM PO, cevimeline, furosemide, glimepiride, omeprazole, amoxicillin, metFORMIN, buPROPion, LORazepam, estradiol, medroxyPROGESTERone, and oxybutynin.  Meds ordered this encounter  Medications  . DISCONTD: oxyCODONE (OXYCONTIN) 20 mg 12 hr tablet    Sig: Take 1 tablet (20 mg total) by mouth every 12 (twelve) hours. Please fill on or after 10/31/15    Dispense:  60 tablet    Refill:  0  . DISCONTD: oxyCODONE (ROXICODONE) 15 MG immediate release tablet    Sig: Take 1 tablet (15 mg total) by mouth every 6 (six) hours as needed. Please fill on or after 10/31/15    Dispense:  120 tablet    Refill:  0  . DISCONTD: oxyCODONE (OXYCONTIN) 20 mg 12 hr tablet    Sig: Take 1 tablet (20 mg total) by mouth every 12 (twelve) hours. Please fill on or after 11/30/15    Dispense:  60 tablet    Refill:  0  . DISCONTD: oxyCODONE (ROXICODONE) 15 MG immediate release tablet    Sig: Take 1 tablet (15 mg total) by mouth every 6 (six) hours as needed. Please fill on or after 11/30/15    Dispense:  120 tablet    Refill:  0  . oxyCODONE (OXYCONTIN) 20 mg 12 hr tablet    Sig: Take 1 tablet (20 mg total) by mouth every 12 (twelve) hours. Please fill on or after 12/31/15    Dispense:  60 tablet    Refill:  0  . oxyCODONE (ROXICODONE) 15 MG immediate release tablet    Sig: Take 1 tablet (15 mg total) by mouth every 6 (six) hours as needed. Please fill on or after 12/31/15    Dispense:  120 tablet      Refill:  0     Follow-up: Return in about 3 months (around 01/24/2016) for a follow-up visit.  [name], MD

## 2015-10-24 NOTE — Progress Notes (Signed)
Pre visit review using our clinic review tool, if applicable. No additional management support is needed unless otherwise documented below in the visit note. 

## 2015-10-24 NOTE — Assessment & Plan Note (Signed)
Long standing Glimeperide, Metformin, Actos

## 2015-10-24 NOTE — Assessment & Plan Note (Signed)
Chronic  On Coreg 

## 2015-10-24 NOTE — Assessment & Plan Note (Signed)
Oxycontin, Oxycodone

## 2015-10-24 NOTE — Assessment & Plan Note (Signed)
On Actos, Glimepiride, Metformin

## 2015-10-24 NOTE — Assessment & Plan Note (Signed)
Furosemide prn 

## 2015-10-28 NOTE — Progress Notes (Signed)
I have reviewed this pharmacist's note and agree  

## 2015-12-15 MED FILL — FUROSEMIDE 40 MG TABLET: 40 | 90 days supply | Qty: 90 | Fill #2

## 2015-12-15 MED FILL — CEVIMELINE HCL 30 MG CAP: 30 | 90 days supply | Qty: 270 | Fill #2

## 2015-12-30 DIAGNOSIS — Z471 Aftercare following joint replacement surgery: Secondary | ICD-10-CM | POA: Diagnosis not present

## 2015-12-30 DIAGNOSIS — Z96651 Presence of right artificial knee joint: Secondary | ICD-10-CM | POA: Diagnosis not present

## 2015-12-31 DIAGNOSIS — Z01419 Encounter for gynecological examination (general) (routine) without abnormal findings: Secondary | ICD-10-CM | POA: Diagnosis not present

## 2015-12-31 DIAGNOSIS — Z1231 Encounter for screening mammogram for malignant neoplasm of breast: Secondary | ICD-10-CM | POA: Diagnosis not present

## 2015-12-31 DIAGNOSIS — D649 Anemia, unspecified: Secondary | ICD-10-CM | POA: Diagnosis not present

## 2015-12-31 DIAGNOSIS — Z6841 Body Mass Index (BMI) 40.0 and over, adult: Secondary | ICD-10-CM | POA: Diagnosis not present

## 2015-12-31 DIAGNOSIS — Z01411 Encounter for gynecological examination (general) (routine) with abnormal findings: Secondary | ICD-10-CM | POA: Diagnosis not present

## 2016-01-02 MED FILL — OxyCONTIN 20 MG T12A: 20 | 30 days supply | Qty: 60 | Fill #0

## 2016-01-02 MED FILL — LORazepam 2 MG TABS: 2 | 30 days supply | Qty: 60 | Fill #2

## 2016-01-02 MED FILL — oxyCODONE HCL 15 MG TABS: 15 | 30 days supply | Qty: 120 | Fill #0

## 2016-01-06 MED FILL — ESTRADIOL 0.5 MG TABLET: 0.5 | 90 days supply | Qty: 90 | Fill #0

## 2016-01-12 MED FILL — MEDROXYPROGESTERONE 2.5 MG: 2.5 | 90 days supply | Qty: 90 | Fill #0

## 2016-01-12 MED FILL — CARVEDILOL 25 MG TABLET: 25 | 90 days supply | Qty: 180 | Fill #3

## 2016-01-14 DIAGNOSIS — M7989 Other specified soft tissue disorders: Secondary | ICD-10-CM | POA: Diagnosis not present

## 2016-01-14 DIAGNOSIS — M84375D Stress fracture, left foot, subsequent encounter for fracture with routine healing: Secondary | ICD-10-CM | POA: Diagnosis not present

## 2016-01-14 MED FILL — LIDOCAINE 5% OINTMENT: 5 | 15 days supply | Qty: 60 | Fill #0

## 2016-01-16 ENCOUNTER — Other Ambulatory Visit: Payer: Self-pay | Admitting: Sports Medicine

## 2016-01-16 LAB — HM PAP SMEAR: HM Pap smear: NORMAL

## 2016-01-20 ENCOUNTER — Other Ambulatory Visit (HOSPITAL_COMMUNITY): Payer: Self-pay | Admitting: Sports Medicine

## 2016-01-20 DIAGNOSIS — M84375D Stress fracture, left foot, subsequent encounter for fracture with routine healing: Secondary | ICD-10-CM

## 2016-01-23 ENCOUNTER — Ambulatory Visit (HOSPITAL_COMMUNITY)
Admission: RE | Admit: 2016-01-23 | Discharge: 2016-01-23 | Disposition: A | Discharge status: Home / Self Care | Payer: 59 | Source: Ambulatory Visit | Attending: Sports Medicine | Admitting: Sports Medicine

## 2016-01-23 DIAGNOSIS — M2142 Flat foot [pes planus] (acquired), left foot: Secondary | ICD-10-CM | POA: Insufficient documentation

## 2016-01-23 DIAGNOSIS — M84375D Stress fracture, left foot, subsequent encounter for fracture with routine healing: Secondary | ICD-10-CM | POA: Insufficient documentation

## 2016-01-23 DIAGNOSIS — M84375A Stress fracture, left foot, initial encounter for fracture: Secondary | ICD-10-CM | POA: Diagnosis not present

## 2016-01-23 DIAGNOSIS — M19072 Primary osteoarthritis, left ankle and foot: Secondary | ICD-10-CM | POA: Diagnosis not present

## 2016-01-26 MED FILL — OXYBUTYNIN 5 MG TABLET: 5 | 90 days supply | Qty: 45 | Fill #1

## 2016-01-26 MED FILL — GLIMEPIRIDE 1 MG TABLET: 1 | 90 days supply | Qty: 90 | Fill #2

## 2016-01-26 MED FILL — DULoxetine HCL 60 MG CPEP: 60 | 90 days supply | Qty: 90 | Fill #0

## 2016-01-26 MED FILL — CYCLOBENZAPRINE 10 MG TAB: 10 | 90 days supply | Qty: 270 | Fill #0

## 2016-01-26 MED FILL — metFORMIN HCL 1000 MG TABS: 1000 | 90 days supply | Qty: 180 | Fill #0

## 2016-01-27 DIAGNOSIS — M25472 Effusion, left ankle: Secondary | ICD-10-CM | POA: Diagnosis not present

## 2016-01-27 DIAGNOSIS — M84375D Stress fracture, left foot, subsequent encounter for fracture with routine healing: Secondary | ICD-10-CM | POA: Diagnosis not present

## 2016-01-28 ENCOUNTER — Ambulatory Visit (INDEPENDENT_AMBULATORY_CARE_PROVIDER_SITE_OTHER): Payer: Self-pay | Admitting: Family Medicine

## 2016-01-28 ENCOUNTER — Ambulatory Visit: Payer: 59 | Admitting: Pharmacist

## 2016-01-28 ENCOUNTER — Encounter: Payer: Self-pay | Admitting: Pharmacist

## 2016-01-28 VITALS — BP 132/80 | Wt 257.0 lb

## 2016-01-28 DIAGNOSIS — E118 Type 2 diabetes mellitus with unspecified complications: Secondary | ICD-10-CM

## 2016-01-28 NOTE — Progress Notes (Signed)
Subjective:  Patient is a 65 yo female with type 2 diabetes who presents today for 3 month follow-up as part of the employer-sponsored [facility] program. Current diabetes regimen includes Metformin, glimepiride, and pioglitazone. Patient does not take daily ASA, ACEi, or statin.  Most recent MD follow-up was Nov with [name].  Patient has a pending appt for this month for follow-up. No med changes at this time.  Of note, pts heel fracture has healed, but ligament issues remain for which she is considering physical therapy.  Of note, patient has adjusted her GERD regimen.  She was getting frequent, nearly daily, headaches, which resolved after discontinuing omeprazole and returned once resuming omeprazole.  Instead she is not taking OTC famotidine twice daily and omeprazole only as needed.  Headaches have resolved.  She is also now taking OTC ferrous sulfate once daily.      Diabetes Assessment:  No changes to diabetes regimen at this time. Patient does maintain good medication compliance. Most recent A1c was unchanged at 7.0% which is at goal of less than 7%.  A1c will be drawn at upcoming MD appt, later this week. Weight has remained unchanged since last visit.  Patient did not bring meter today and is not currently testing. No hypoglycemia reported. Patient does demonstrate appropriate correction of hypoglycemia when needed.  Patient reports continued signs and symptoms of neuropathy including numbness/tingling/burning.  She is treating with lidocaine ointment and this is providing some relief.  No symptoms of infection.  Patient is up to date on eye and dental exams.    Lifestyle Assessment:  Diet - no major changes, pt reports portions remain under good control.  Dietary choices could use improvement as patient continues to opt for quick, frozen meals or snack type items for meals.  She attempts to make healthy choices, but admits diet continues to be inconsistent and erratic with regard  to food choices and meal scheduling.  She does not wish to set any goals at this time.  I have encouraged her to resume testing and to determine if there are certain foods she is eating that correlate to foods she is eating.    Exercise - No routine exercise at this time due to heal injury and neuropathy. She plans to do much walking on her upcoming cruise/trip to [place].  She will be using her foot/heel splint if needed.  No goals at this time other than to exercise as neuropathy and foot healing allows.     Plan and Goals: 1)  Exercise as tolerated depending on healing of heel 2)  Attempt to increase testing to once daily, attempt to make correlation between problem foods and hyperglycemic readings 3)  Follow-up in 4 months on Wednesday June 21st at 9:30 am  Enjoy your trip and great to see you today!  Tilman Neat, PharmD [facility]  [phone]

## 2016-01-28 NOTE — Patient Instructions (Signed)
1)  Exercise as tolerated depending on healing of heel 2)  Attempt to increase testing to once daily, attempt to make correlation between problem foods and hyperglycemic readings 3)  Follow-up in 4 months on Wednesday June 21st at 9:30 am  Enjoy your trip and great to see you today!

## 2016-01-30 ENCOUNTER — Ambulatory Visit (INDEPENDENT_AMBULATORY_CARE_PROVIDER_SITE_OTHER): Payer: 59 | Admitting: Internal Medicine

## 2016-01-30 ENCOUNTER — Encounter: Payer: Self-pay | Admitting: Internal Medicine

## 2016-01-30 VITALS — BP 122/84 | HR 76 | Wt 261.0 lb

## 2016-01-30 DIAGNOSIS — M8949 Other hypertrophic osteoarthropathy, multiple sites: Secondary | ICD-10-CM

## 2016-01-30 DIAGNOSIS — E119 Type 2 diabetes mellitus without complications: Secondary | ICD-10-CM

## 2016-01-30 DIAGNOSIS — G8929 Other chronic pain: Secondary | ICD-10-CM

## 2016-01-30 DIAGNOSIS — M150 Primary generalized (osteo)arthritis: Secondary | ICD-10-CM | POA: Diagnosis not present

## 2016-01-30 DIAGNOSIS — F32A Depression, unspecified: Secondary | ICD-10-CM

## 2016-01-30 DIAGNOSIS — F329 Major depressive disorder, single episode, unspecified: Secondary | ICD-10-CM

## 2016-01-30 DIAGNOSIS — M5440 Lumbago with sciatica, unspecified side: Secondary | ICD-10-CM | POA: Diagnosis not present

## 2016-01-30 DIAGNOSIS — I10 Essential (primary) hypertension: Secondary | ICD-10-CM

## 2016-01-30 DIAGNOSIS — M159 Polyosteoarthritis, unspecified: Secondary | ICD-10-CM

## 2016-01-30 MED ORDER — OXYCODONE HCL 15 MG PO TABS: 15.0000 mg | ORAL_TABLET | Freq: Four times a day (QID) | ORAL | Status: DC | PRN

## 2016-01-30 MED ORDER — AMOXICILLIN 500 MG PO CAPS: ORAL_CAPSULE | ORAL | Status: DC

## 2016-01-30 MED ORDER — OXYCODONE HCL ER 20 MG PO T12A: 20.0000 mg | EXTENDED_RELEASE_TABLET | Freq: Two times a day (BID) | ORAL | Status: DC

## 2016-01-30 MED ORDER — LORAZEPAM 2 MG PO TABS: 1.0000 mg | ORAL_TABLET | Freq: Four times a day (QID) | ORAL | Status: DC | PRN

## 2016-01-30 MED ORDER — MECLIZINE HCL 12.5 MG PO TABS: 12.5000 mg | ORAL_TABLET | Freq: Three times a day (TID) | ORAL | Status: DC | PRN

## 2016-01-30 MED ORDER — PANTOPRAZOLE SODIUM 40 MG PO TBEC: 40.0000 mg | DELAYED_RELEASE_TABLET | Freq: Every day | ORAL | Status: DC

## 2016-01-30 MED FILL — AMOXICILLIN 500 MG CAPSULE: 500 | 4 days supply | Qty: 16 | Fill #0

## 2016-01-30 NOTE — Assessment & Plan Note (Signed)
On Coreg 

## 2016-01-30 NOTE — Assessment & Plan Note (Signed)
Chronic On Wellbutrin

## 2016-01-30 NOTE — Progress Notes (Signed)
Subjective:  Patient ID: [name], female    DOB: 12-Jul-1951  Age: 65 y.o. MRN: [id number]  CC: No chief complaint on file.   HPI JADEA BIBBEE presents for knee OA, LBP, DM2, HTN f/u C/o vertigo x1d  Outpatient Prescriptions Prior to Visit  Medication Sig Dispense Refill  . buPROPion (WELLBUTRIN XL) 150 MG 24 hr tablet Take 1 tablet (150 mg total) by mouth 2 (two) times daily. 180 tablet 3  . carvedilol (COREG) 25 MG tablet Take 1 tablet (25 mg total) by mouth 2 (two) times daily with a meal. 180 tablet 3  . cevimeline (EVOXAC) 30 MG capsule 2 in the morning and 1 in the evening 270 capsule 3  . cyclobenzaprine (FLEXERIL) 10 MG tablet Take 3 tablets (30 mg total) by mouth at bedtime. (Patient taking differently: Take 20 mg by mouth at bedtime. ) 270 tablet 3  . DULoxetine (CYMBALTA) 60 MG capsule Take 1 capsule (60 mg total) by mouth at bedtime. 90 capsule 3  . estradiol (ESTRACE) 0.5 MG tablet Take 0.5 mg by mouth daily.    . famotidine (PEPCID) 10 MG tablet Take 10 mg by mouth 2 (two) times daily.    . ferrous sulfate 325 (65 FE) MG tablet Take 325 mg by mouth daily with breakfast.    . furosemide (LASIX) 40 MG tablet TAKE 1/2 TO 1 TABLET BY MOUTH ONCE DAILY AS NEEDED FOR SWELLING 90 tablet 3  . glimepiride (AMARYL) 1 MG tablet TAKE 1 TABLET BY MOUTH DAILY BEFORE BREAKFAST. 90 tablet 3  . glucose blood (ONE TOUCH TEST STRIPS) test strip Use as instructed 50 each 3  . ibuprofen (ADVIL,MOTRIN) 200 MG tablet Take 800 mg by mouth 2 (two) times daily as needed (For pain.).    Marland Kitchen Lancets (ONETOUCH ULTRASOFT) lancets Use as instructed 100 each 3  . lidocaine (LIDODERM) 5 % Place 1-3 patches onto the skin daily as needed (Pain). (Patient taking differently: Place 1-3 patches onto the skin daily as needed (For pain.). ) 60 patch 4  . LUTEIN PO Take 1 tablet by mouth at bedtime.    . medroxyPROGESTERone (PROVERA) 2.5 MG tablet Take 2.5 mg by mouth daily.    . metFORMIN (GLUCOPHAGE) 1000 MG  tablet Take 1 tablet (1,000 mg total) by mouth 2 (two) times daily with a meal. 180 tablet 3  . Multiple Vitamin (MULTIVITAMIN WITH MINERALS) TABS tablet Take 1 tablet by mouth daily.    . Omega-3 300 MG CAPS Take 300 mg by mouth at bedtime.    Marland Kitchen oxybutynin (DITROPAN) 5 MG tablet TAKE 1/2 TABLET BY MOUTH DAILY 45 tablet 3  . pioglitazone (ACTOS) 15 MG tablet Take 1 tablet (15 mg total) by mouth daily. 90 tablet 3  . polyvinyl alcohol (LIQUIFILM TEARS) 1.4 % ophthalmic solution Place 1 drop into both eyes daily as needed for dry eyes.    . PSYLLIUM PO Take 1 capsule by mouth 2 (two) times daily.    . Vitamin D, Cholecalciferol, 1000 UNITS TABS Take 1,000 Units by mouth daily.    Marland Kitchen LORazepam (ATIVAN) 2 MG tablet Take 0.5 tablets (1 mg total) by mouth every 6 (six) hours as needed for anxiety. 60 tablet 3  . oxyCODONE (OXYCONTIN) 20 mg 12 hr tablet Take 1 tablet (20 mg total) by mouth every 12 (twelve) hours. Please fill on or after 12/31/15 60 tablet 0  . oxyCODONE (ROXICODONE) 15 MG immediate release tablet Take 1 tablet (15 mg total) by  mouth every 6 (six) hours as needed. Please fill on or after 12/31/15 120 tablet 0  . omeprazole (PRILOSEC) 20 MG capsule Take 1 capsule (20 mg total) by mouth daily. (Patient not taking: Reported on 01/30/2016) 90 capsule 3  . amoxicillin (AMOXIL) 500 MG capsule Take 4 by mouth one hour prior to dental procedure/cleaning. (Patient not taking: Reported on 01/30/2016) 4 capsule 3   No facility-administered medications prior to visit.    ROS Review of Systems  Constitutional: Negative for chills, activity change, appetite change, fatigue and unexpected weight change.  HENT: Negative for congestion, mouth sores and sinus pressure.   Eyes: Negative for visual disturbance.  Respiratory: Negative for cough and chest tightness.   Gastrointestinal: Negative for nausea and abdominal pain.  Genitourinary: Negative for frequency, difficulty urinating and vaginal pain.    Musculoskeletal: Positive for back pain and arthralgias. Negative for gait problem.  Skin: Negative for pallor and rash.  Neurological: Positive for dizziness. Negative for tremors, syncope, weakness, numbness and headaches.  Psychiatric/Behavioral: Positive for dysphoric mood. Negative for confusion and sleep disturbance. The patient is nervous/anxious.     Objective:  BP 122/84 mmHg  Pulse 76  Wt 261 lb (118.389 kg)  SpO2 98%  BP Readings from Last 3 Encounters:  01/30/16 122/84  01/28/16 132/80  10/24/15 140/77    Wt Readings from Last 3 Encounters:  01/30/16 261 lb (118.389 kg)  01/28/16 257 lb (116.574 kg)  10/24/15 255 lb (115.667 kg)    Physical Exam  Constitutional: She appears well-developed. No distress.  HENT:  Head: Normocephalic.  Right Ear: External ear normal.  Left Ear: External ear normal.  Nose: Nose normal.  Mouth/Throat: Oropharynx is clear and moist.  Eyes: Conjunctivae are normal. Pupils are equal, round, and reactive to light. Right eye exhibits no discharge. Left eye exhibits no discharge.  Neck: Normal range of motion. Neck supple. No JVD present. No tracheal deviation present. No thyromegaly present.  Cardiovascular: Normal rate, regular rhythm and normal heart sounds.   Pulmonary/Chest: No stridor. No respiratory distress. She has no wheezes.  Abdominal: Soft. Bowel sounds are normal. She exhibits no distension and no mass. There is no tenderness. There is no rebound and no guarding.  Musculoskeletal: She exhibits tenderness. She exhibits no edema.  Lymphadenopathy:    She has no cervical adenopathy.  Neurological: She displays normal reflexes. No cranial nerve deficit. She exhibits normal muscle tone. Coordination abnormal.  Skin: No rash noted. No erythema.  Psychiatric: She has a normal mood and affect. Her behavior is normal. Judgment and thought content normal.  LS, B knees - tender  Lab Results  Component Value Date   WBC 14.4*  03/07/2015   HGB 9.2* 03/07/2015   HCT 28.4* 03/07/2015   PLT 252 03/07/2015   GLUCOSE 160* 10/24/2015   CHOL 215* 09/25/2014   TRIG 154.0* 09/25/2014   HDL 46.00 09/25/2014   LDLCALC 138* 09/25/2014   ALT 17 02/26/2015   AST 18 02/26/2015   NA 134* 10/24/2015   K 4.1 10/24/2015   CL 93* 10/24/2015   CREATININE 1.59* 10/24/2015   BUN 48* 10/24/2015   CO2 31 10/24/2015   TSH 1.64 07/25/2013   INR 1.04 02/26/2015   HGBA1C 7.0* 10/24/2015    Ct Foot Left Wo Contrast  01/23/2016  CLINICAL DATA:  Patient with a history of a left calcaneal stress fracture diagnosed in May, 2016. Follow-up examination. EXAM: CT OF THE LEFT FOOT WITHOUT CONTRAST TECHNIQUE: Multidetector CT imaging of  the left foot was performed according to the standard protocol. Multiplanar CT image reconstructions were also generated. COMPARISON:  Plain films left ankle 05/12/2015. MRI left ankle 09/25/2015. FINDINGS: No acute fracture is identified. There is sclerosis in the anterior process of the calcaneus at the site of previous fracture most consistent with healed injury. The patient has a pes planus deformity. Midfoot osteoarthritis is present and appears worst about the articulation of the navicular and medial cuneiform and third tarsometatarsal joint. Bones appear somewhat osteopenic. No osteochondral lesion of the talar dome is identified. Plantar calcaneal spur is noted. The sesamoid bones are sclerotic compatible with sesamoid dysfunction. The tibialis posterior tendon appears thickened and there is likely fluid about it compatible with tendinosis. No tendon tear is identified. No ligament tear is seen. No focal fluid collection or mass is noted. IMPRESSION: No acute fracture is identified. Sclerosis of the calcaneus at the calcaneocuboid joint and about the anterior process is compatible with healed fracture. Pes planus deformity. Midfoot osteoarthritis. 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 Sclerotic sesamoid bones  compatible with sesamoid dysfunction. Electronically Signed   By: Inge Rise M.D.   On: 01/23/2016 16:00    Assessment & Plan:   Diagnoses and all orders for this visit:  Controlled type 2 diabetes mellitus without complication, without long-term current use of insulin ([name])  Primary osteoarthritis involving multiple joints  Chronic midline low back pain with sciatica, sciatica laterality unspecified  Depression  Essential hypertension  Other orders -     Discontinue: oxyCODONE (OXYCONTIN) 20 mg 12 hr tablet; Take 1 tablet (20 mg total) by mouth every 12 (twelve) hours. Please fill on or after 01/31/16 -     Discontinue: oxyCODONE (ROXICODONE) 15 MG immediate release tablet; Take 1 tablet (15 mg total) by mouth every 6 (six) hours as needed. Please fill on or after 01/31/16 -     LORazepam (ATIVAN) 2 MG tablet; Take 0.5 tablets (1 mg total) by mouth every 6 (six) hours as needed for anxiety. -     amoxicillin (AMOXIL) 500 MG capsule; Take 4 by mouth one hour prior to dental procedure/cleaning. -     Discontinue: oxyCODONE (OXYCONTIN) 20 mg 12 hr tablet; Take 1 tablet (20 mg total) by mouth every 12 (twelve) hours. Please fill on or after 02/28/16 -     Discontinue: oxyCODONE (ROXICODONE) 15 MG immediate release tablet; Take 1 tablet (15 mg total) by mouth every 6 (six) hours as needed. Please fill on or after 02/28/16 -     oxyCODONE (OXYCONTIN) 20 mg 12 hr tablet; Take 1 tablet (20 mg total) by mouth every 12 (twelve) hours. Please fill on or after 03/30/16 -     oxyCODONE (ROXICODONE) 15 MG immediate release tablet; Take 1 tablet (15 mg total) by mouth every 6 (six) hours as needed. Please fill on or after 03/30/16 -     meclizine (ANTIVERT) 12.5 MG tablet; Take 1 tablet (12.5 mg total) by mouth 3 (three) times daily as needed for dizziness. -     pantoprazole (PROTONIX) 40 MG tablet; Take 1 tablet (40 mg total) by mouth daily.   I have discontinued Ms. Nadeau's oxyCODONE, oxyCODONE,  oxyCODONE, and oxyCODONE. I have also changed her oxyCODONE and oxyCODONE. Additionally, I am having her start on meclizine and pantoprazole. Lastly, I am having her maintain her onetouch ultrasoft, glucose blood, polyvinyl alcohol, cyclobenzaprine, DULoxetine, lidocaine, carvedilol, pioglitazone, Vitamin D (Cholecalciferol), ibuprofen, LUTEIN PO, multivitamin with minerals, Omega-3, PSYLLIUM PO, cevimeline, furosemide, glimepiride, omeprazole, metFORMIN,  buPROPion, estradiol, medroxyPROGESTERone, oxybutynin, famotidine, ferrous sulfate, LORazepam, and amoxicillin.  Meds ordered this encounter  Medications  . DISCONTD: oxyCODONE (OXYCONTIN) 20 mg 12 hr tablet    Sig: Take 1 tablet (20 mg total) by mouth every 12 (twelve) hours. Please fill on or after 01/31/16    Dispense:  60 tablet    Refill:  0  . DISCONTD: oxyCODONE (ROXICODONE) 15 MG immediate release tablet    Sig: Take 1 tablet (15 mg total) by mouth every 6 (six) hours as needed. Please fill on or after 01/31/16    Dispense:  120 tablet    Refill:  0  . LORazepam (ATIVAN) 2 MG tablet    Sig: Take 0.5 tablets (1 mg total) by mouth every 6 (six) hours as needed for anxiety.    Dispense:  60 tablet    Refill:  3  . amoxicillin (AMOXIL) 500 MG capsule    Sig: Take 4 by mouth one hour prior to dental procedure/cleaning.    Dispense:  16 capsule    Refill:  1  . DISCONTD: oxyCODONE (OXYCONTIN) 20 mg 12 hr tablet    Sig: Take 1 tablet (20 mg total) by mouth every 12 (twelve) hours. Please fill on or after 02/28/16    Dispense:  60 tablet    Refill:  0  . DISCONTD: oxyCODONE (ROXICODONE) 15 MG immediate release tablet    Sig: Take 1 tablet (15 mg total) by mouth every 6 (six) hours as needed. Please fill on or after 02/28/16    Dispense:  120 tablet    Refill:  0  . oxyCODONE (OXYCONTIN) 20 mg 12 hr tablet    Sig: Take 1 tablet (20 mg total) by mouth every 12 (twelve) hours. Please fill on or after 03/30/16    Dispense:  60 tablet     Refill:  0  . oxyCODONE (ROXICODONE) 15 MG immediate release tablet    Sig: Take 1 tablet (15 mg total) by mouth every 6 (six) hours as needed. Please fill on or after 03/30/16    Dispense:  120 tablet    Refill:  0  . meclizine (ANTIVERT) 12.5 MG tablet    Sig: Take 1 tablet (12.5 mg total) by mouth 3 (three) times daily as needed for dizziness.    Dispense:  60 tablet    Refill:  1  . pantoprazole (PROTONIX) 40 MG tablet    Sig: Take 1 tablet (40 mg total) by mouth daily.    Dispense:  90 tablet    Refill:  3     Follow-up: Return in about 3 months (around 04/28/2016) for a follow-up visit.  [name], MD

## 2016-01-30 NOTE — Progress Notes (Signed)
Pre visit review using our clinic review tool, if applicable. No additional management support is needed unless otherwise documented below in the visit note. 

## 2016-01-30 NOTE — Assessment & Plan Note (Signed)
Long standing Glimeperide, Metformin, Actos

## 2016-01-30 NOTE — Assessment & Plan Note (Signed)
Severe, chronic Knees and low back Oxy/percocet  Potential benefits of a long term opioids use as well as potential risks (i.e. addiction risk, apnea etc) and complications (i.e. Somnolence, constipation and others) were explained to the patient and were aknowledged.

## 2016-01-31 ENCOUNTER — Encounter: Payer: Self-pay | Admitting: Internal Medicine

## 2016-02-02 MED FILL — PANTOPRAZOLE SOD DR 40 MG T: 40 | 90 days supply | Qty: 90 | Fill #0

## 2016-02-02 MED FILL — LORazepam 2 MG TABS: 2 | 30 days supply | Qty: 60 | Fill #0

## 2016-02-02 MED FILL — OxyCONTIN 20 MG T12A: 20 | 30 days supply | Qty: 60 | Fill #0

## 2016-02-02 MED FILL — oxyCODONE HCL 15 MG TABS: 15 | 30 days supply | Qty: 120 | Fill #0

## 2016-02-05 DIAGNOSIS — Z130 Encounter for screening for diseases of the blood and blood-forming organs and certain disorders involving the immune mechanism: Secondary | ICD-10-CM | POA: Diagnosis not present

## 2016-02-05 NOTE — Progress Notes (Signed)
I have reviewed this pharmacist's note and agree  

## 2016-03-01 ENCOUNTER — Emergency Department (HOSPITAL_BASED_OUTPATIENT_CLINIC_OR_DEPARTMENT_OTHER): Payer: 59

## 2016-03-01 ENCOUNTER — Telehealth: Payer: Self-pay | Admitting: Internal Medicine

## 2016-03-01 ENCOUNTER — Encounter (HOSPITAL_BASED_OUTPATIENT_CLINIC_OR_DEPARTMENT_OTHER): Payer: Self-pay | Admitting: Emergency Medicine

## 2016-03-01 ENCOUNTER — Emergency Department (HOSPITAL_BASED_OUTPATIENT_CLINIC_OR_DEPARTMENT_OTHER)
Admission: EM | Admit: 2016-03-01 | Discharge: 2016-03-01 | Disposition: A | Discharge status: Home / Self Care | Payer: 59 | Attending: Emergency Medicine | Admitting: Emergency Medicine

## 2016-03-01 DIAGNOSIS — Z79818 Long term (current) use of other agents affecting estrogen receptors and estrogen levels: Secondary | ICD-10-CM | POA: Insufficient documentation

## 2016-03-01 DIAGNOSIS — Z8701 Personal history of pneumonia (recurrent): Secondary | ICD-10-CM | POA: Diagnosis not present

## 2016-03-01 DIAGNOSIS — K219 Gastro-esophageal reflux disease without esophagitis: Secondary | ICD-10-CM | POA: Insufficient documentation

## 2016-03-01 DIAGNOSIS — Z87448 Personal history of other diseases of urinary system: Secondary | ICD-10-CM | POA: Insufficient documentation

## 2016-03-01 DIAGNOSIS — R0602 Shortness of breath: Secondary | ICD-10-CM | POA: Diagnosis not present

## 2016-03-01 DIAGNOSIS — Z7984 Long term (current) use of oral hypoglycemic drugs: Secondary | ICD-10-CM | POA: Insufficient documentation

## 2016-03-01 DIAGNOSIS — Z8739 Personal history of other diseases of the musculoskeletal system and connective tissue: Secondary | ICD-10-CM | POA: Diagnosis not present

## 2016-03-01 DIAGNOSIS — F419 Anxiety disorder, unspecified: Secondary | ICD-10-CM | POA: Insufficient documentation

## 2016-03-01 DIAGNOSIS — Z794 Long term (current) use of insulin: Secondary | ICD-10-CM | POA: Diagnosis not present

## 2016-03-01 DIAGNOSIS — E119 Type 2 diabetes mellitus without complications: Secondary | ICD-10-CM | POA: Insufficient documentation

## 2016-03-01 DIAGNOSIS — J069 Acute upper respiratory infection, unspecified: Secondary | ICD-10-CM | POA: Insufficient documentation

## 2016-03-01 DIAGNOSIS — D649 Anemia, unspecified: Secondary | ICD-10-CM | POA: Diagnosis not present

## 2016-03-01 DIAGNOSIS — Z79899 Other long term (current) drug therapy: Secondary | ICD-10-CM | POA: Insufficient documentation

## 2016-03-01 DIAGNOSIS — I10 Essential (primary) hypertension: Secondary | ICD-10-CM | POA: Diagnosis not present

## 2016-03-01 DIAGNOSIS — Z8669 Personal history of other diseases of the nervous system and sense organs: Secondary | ICD-10-CM | POA: Insufficient documentation

## 2016-03-01 DIAGNOSIS — R05 Cough: Secondary | ICD-10-CM | POA: Diagnosis not present

## 2016-03-01 DIAGNOSIS — Z87891 Personal history of nicotine dependence: Secondary | ICD-10-CM | POA: Diagnosis not present

## 2016-03-01 DIAGNOSIS — F329 Major depressive disorder, single episode, unspecified: Secondary | ICD-10-CM | POA: Diagnosis not present

## 2016-03-01 MED ORDER — AZITHROMYCIN 250 MG PO TABS: 250.0000 mg | ORAL_TABLET | Freq: Every day | ORAL | Status: DC

## 2016-03-01 MED ORDER — IPRATROPIUM-ALBUTEROL 0.5-2.5 (3) MG/3ML IN SOLN
3.0000 mL | Freq: Once | RESPIRATORY_TRACT | Status: AC
  Administered 2016-03-01: 3 mL via RESPIRATORY_TRACT
  Filled 2016-03-01: qty 3

## 2016-03-01 NOTE — ED Notes (Signed)
Patient states that she was exposed to someone sick and coughing last week. Reports that she currently is coughing with noted congestion. The patient reports that she is also having green sputum. Reports that she is having trouble loosing her voice.

## 2016-03-01 NOTE — Telephone Encounter (Signed)
[facility]  Patient Name: [name]  DOB: 11-08-1951    Initial Comment Caller states she is coughing up yellow green mucous. Low grade fever.    Nurse Assessment  Nurse: Justine Null, RN, Rodena Piety Date/Time Eilene Ghazi Time): 03/01/2016 4:12:48 PM  Confirm and document reason for call. If symptomatic, describe symptoms. You must click the next button to save text entered. ---Caller states she is coughing up yellow green mucous. Low grade fever. caller stated no runny nose or a stuffy nose and has been having since last Sunday and has no vomiting or diarrhea and ahs been able to take fluids well and no breathing difficulties and has wheezing at times  Has the patient traveled out of the country within the last 30 days? ---Yes  Where have you traveled? ([place] for [place] and Ebola guideline, [place], [place] for [place]) ---[place]  Does the patient have any new or worsening symptoms? ---Yes  Will a triage be completed? ---Yes  Related visit to physician within the last 2 weeks? ---No  Does the PT have any chronic conditions? (i.e. diabetes, asthma, etc.) ---Yes  List chronic conditions. ---HTN diabetes  Is this a behavioral health or substance abuse call? ---No     Guidelines    Guideline Title Affirmed Question Affirmed Notes  Cough - Acute Productive Wheezing is present    Final Disposition User   See Physician within 4 Hours (or PCP triage) Justine Null, RN, Rodena Piety    Comments  call placed to the office back line at [phone] and spoke Colletta [place] and informed her of the patient status and the need to be seen in the next 3 to 4 hours and she has no appoint ment for today and she needs to go to the [facility] verbalized understanding   Referrals  GO TO FACILITY UNDECIDED   Disagree/Comply: Comply

## 2016-03-01 NOTE — Discharge Instructions (Signed)
Please take all of your antibiotics until finished!   You may develop abdominal discomfort or diarrhea from the antibiotic. You may help offset this with probiotics which you can buy or get in yogurt, continue usual home medications Rest, drink plenty of fluids Please follow up with your primary doctor in 3-5 days for discussion of your diagnoses and further evaluation after today's visit; Please return to the ER for new or worsening symptoms, any additional concerns.

## 2016-03-01 NOTE — ED Notes (Signed)
PA at bedside.

## 2016-03-01 NOTE — ED Provider Notes (Signed)
CSN: [id number]     Arrival date & time 03/01/16  1713 History   First MD Initiated Contact with Patient 03/01/16 1758     Chief Complaint  Patient presents with  . Cough     (Consider location/radiation/quality/duration/timing/severity/associated sxs/prior Treatment) Patient is a 65 y.o. female presenting with cough. The history is provided by the patient and medical records. No language interpreter was used.  Cough Associated symptoms: fever and shortness of breath   Associated symptoms: no chills, no headaches, no myalgias, no rash and no wheezing    SHALAYAH NICOTERA is a 65 y.o. female  with a PMH of DM, HTN, OA, anxiety/depression who presents to the Emergency Department complaining of worsening productive cough, nasal congestion, and subjective fever x 7 days. Associated symptoms include intermittent sob, sinus pressure. + sick contacts last week. Tylenol for fever with relief. No other medications taken for symptoms. Denies chest pain, abdominal pain, back pain, n/v/d. Called her PCP today but was told there were no appointments until 3/22, therefore she came to ED. No hx of asthma/copd, no home inhalers, not a smoker.   Past Medical History  Diagnosis Date  . Diabetes mellitus     type II  . GERD (gastroesophageal reflux disease)   . Hypertension   . IBS (irritable bowel syndrome)   . Depression   . Anxiety   . LBP (low back pain)   . Osteoarthritis   . Colitis 2010    microscopic- Dr Henrene Pastor  . Renal insufficiency 2011  . Osteopenia   . Anemia   . Pneumonia     hx of 2014   . Headache   . Sinusitis     currently being treated with antibiotic will complete 03/04/2015  . 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 Foramen magnum ependymoma surgery  2003    Dr Rita Ohara  . Total knee arthroplasty      L 2008, R 2009, R 2016- Dr Maureen Ralphs  . Joint replacement Bilateral   . Tonsillectomy    . Nasal sinus surgery      1973   .  Total knee revision Right 03/05/2015    Procedure: RIGHT TOTAL KNEE ARTHROPLASTY REVISION;  Surgeon: Gaynelle Arabian, MD;  Location: [facility];  Service: Orthopedics;  Laterality: Right;   Family History  Problem Relation Age of Onset  . Crohn's disease Maternal Uncle   . Diabetes Other   . Depression Mother   . Hypertension Mother   . Stroke Mother 21  . Diabetes Mother   . Diabetes Father   . Hyperlipidemia Father   . Hypertension Father   . Cancer Brother     Prostate  . Diabetes Brother   . Heart disease Brother   . Hyperlipidemia Brother   . Hypertension Brother   . Diabetes Brother   . Heart disease Brother     Heart Disease before age 3  . Heart attack Brother   . Stroke Brother     X's 2  . Hyperlipidemia Brother   . Hypertension Brother   . Colon cancer Neg Hx    Social History  Substance Use Topics  . Smoking status: Former Smoker -- 1.00 packs/day for 20 years    Types: Cigarettes    Quit date: 12/13/1988  . Smokeless tobacco: Never Used  . Alcohol Use: 0.0 oz/week    0 Standard drinks or equivalent per week  Comment: rarely   OB History    No data available     Review of Systems  Constitutional: Positive for fever. Negative for chills.  HENT: Positive for congestion and sinus pressure.   Eyes: Negative for visual disturbance.  Respiratory: Positive for cough and shortness of breath. Negative for wheezing and stridor.   Cardiovascular: Negative.   Gastrointestinal: Negative for nausea, vomiting and abdominal pain.  Genitourinary: Negative for dysuria.  Musculoskeletal: Negative for myalgias.  Skin: Negative for rash.  Neurological: Negative for dizziness and headaches.      Allergies  Erythromycin  Home Medications   Prior to Admission medications   Medication Sig Start Date End Date Taking? Authorizing Provider  amoxicillin (AMOXIL) 500 MG capsule Take 4 by mouth one hour prior to dental procedure/cleaning. 01/30/16   Aleksei Plotnikov V, MD   azithromycin (ZITHROMAX) 250 MG tablet Take 1 tablet (250 mg total) by mouth daily. Take first 2 tablets together, then 1 every day until finished. 03/01/16   Jaime Pilcher Ward, PA-C  buPROPion (WELLBUTRIN XL) 150 MG 24 hr tablet Take 1 tablet (150 mg total) by mouth 2 (two) times daily. 09/15/15   Aleksei Plotnikov V, MD  carvedilol (COREG) 25 MG tablet Take 1 tablet (25 mg total) by mouth 2 (two) times daily with a meal. 03/26/15   Aleksei Plotnikov V, MD  cevimeline (EVOXAC) 30 MG capsule 2 in the morning and 1 in the evening 05/26/15   Aleksei Plotnikov V, MD  cyclobenzaprine (FLEXERIL) 10 MG tablet Take 3 tablets (30 mg total) by mouth at bedtime. Patient taking differently: Take 20 mg by mouth at bedtime.  03/07/15   Gaynelle Arabian, MD  DULoxetine (CYMBALTA) 60 MG capsule Take 1 capsule (60 mg total) by mouth at bedtime. 03/07/15   Gaynelle Arabian, MD  estradiol (ESTRACE) 0.5 MG tablet Take 0.5 mg by mouth daily.    Historical Provider, MD  famotidine (PEPCID) 10 MG tablet Take 10 mg by mouth 2 (two) times daily.    Historical Provider, MD  ferrous sulfate 325 (65 FE) MG tablet Take 325 mg by mouth daily with breakfast.    Historical Provider, MD  furosemide (LASIX) 40 MG tablet TAKE 1/2 TO 1 TABLET BY MOUTH ONCE DAILY AS NEEDED FOR SWELLING 05/26/15   Aleksei Plotnikov V, MD  glimepiride (AMARYL) 1 MG tablet TAKE 1 TABLET BY MOUTH DAILY BEFORE BREAKFAST. 05/26/15   Aleksei Plotnikov V, MD  glucose blood (ONE TOUCH TEST STRIPS) test strip Use as instructed 06/26/14   Lew Dawes V, MD  ibuprofen (ADVIL,MOTRIN) 200 MG tablet Take 800 mg by mouth 2 (two) times daily as needed (For pain.).    Historical Provider, MD  Lancets [facility] ULTRASOFT) lancets Use as instructed 08/10/11   Aleksei Plotnikov V, MD  lidocaine (LIDODERM) 5 % Place 1-3 patches onto the skin daily as needed (Pain). Patient taking differently: Place 1-3 patches onto the skin daily as needed (For pain.).  03/07/15   Gaynelle Arabian, MD   LORazepam (ATIVAN) 2 MG tablet Take 0.5 tablets (1 mg total) by mouth every 6 (six) hours as needed for anxiety. 01/30/16   Aleksei Plotnikov V, MD  LUTEIN PO Take 1 tablet by mouth at bedtime.    Historical Provider, MD  meclizine (ANTIVERT) 12.5 MG tablet Take 1 tablet (12.5 mg total) by mouth 3 (three) times daily as needed for dizziness. 01/30/16   Aleksei Plotnikov V, MD  medroxyPROGESTERone (PROVERA) 2.5 MG tablet Take 2.5 mg by mouth daily.  Historical Provider, MD  metFORMIN (GLUCOPHAGE) 1000 MG tablet Take 1 tablet (1,000 mg total) by mouth 2 (two) times daily with a meal. 07/31/15   Lew Dawes V, MD  Multiple Vitamin (MULTIVITAMIN WITH MINERALS) TABS tablet Take 1 tablet by mouth daily.    Historical Provider, MD  Omega-3 300 MG CAPS Take 300 mg by mouth at bedtime.    Historical Provider, MD  omeprazole (PRILOSEC) 20 MG capsule Take 1 capsule (20 mg total) by mouth daily. Patient not taking: Reported on 01/30/2016 05/26/15   Tyrone Apple Plotnikov V, MD  oxybutynin (DITROPAN) 5 MG tablet TAKE 1/2 TABLET BY MOUTH DAILY 10/22/15   Aleksei Plotnikov V, MD  oxyCODONE (OXYCONTIN) 20 mg 12 hr tablet Take 1 tablet (20 mg total) by mouth every 12 (twelve) hours. Please fill on or after 03/30/16 01/30/16   Tyrone Apple Plotnikov V, MD  oxyCODONE (ROXICODONE) 15 MG immediate release tablet Take 1 tablet (15 mg total) by mouth every 6 (six) hours as needed. Please fill on or after 03/30/16 01/30/16   Lew Dawes V, MD  pantoprazole (PROTONIX) 40 MG tablet Take 1 tablet (40 mg total) by mouth daily. 01/30/16   Aleksei Plotnikov V, MD  pioglitazone (ACTOS) 15 MG tablet Take 1 tablet (15 mg total) by mouth daily. 03/26/15   Aleksei Plotnikov V, MD  polyvinyl alcohol (LIQUIFILM TEARS) 1.4 % ophthalmic solution Place 1 drop into both eyes daily as needed for dry eyes.    Historical Provider, MD  PSYLLIUM PO Take 1 capsule by mouth 2 (two) times daily.    Historical Provider, MD  Vitamin D, Cholecalciferol,  1000 UNITS TABS Take 1,000 Units by mouth daily.    Historical Provider, MD   BP 136/75 mmHg  Pulse 76  Temp(Src) 98.8 F (37.1 C) (Oral)  Resp 20  Ht 5\' 3"  (1.6 m)  Wt 120.203 kg  BMI 46.95 kg/m2  SpO2 98% Physical Exam  Constitutional: She is oriented to person, place, and time. She appears well-developed and well-nourished.  Alert and in no acute distress  HENT:  Head: Normocephalic and atraumatic.  Oropharynx with erythema, no exudates, no tonsillar hypertrophy.  Cardiovascular: Normal rate, regular rhythm, normal heart sounds and intact distal pulses.  Exam reveals no gallop and no friction rub.   No murmur heard. Pulmonary/Chest: Effort normal. No respiratory distress. She has wheezes (Faint expiratory wheezing bilaterally.). She has no rales. She exhibits no tenderness.  Abdominal: Soft. She exhibits no distension. There is no tenderness.  Musculoskeletal: Normal range of motion.  Neurological: She is alert and oriented to person, place, and time.  Skin: Skin is warm and dry.  Psychiatric: She has a normal mood and affect. Her behavior is normal. Judgment and thought content normal.  Nursing note and vitals reviewed.   ED Course  Procedures (including critical care time) Labs Review Labs Reviewed - No data to display  Imaging Review Dg Chest 2 View  03/01/2016  CLINICAL DATA:  65 year old female with shortness of breath and productive cough for several weeks. EXAM: CHEST  2 VIEW COMPARISON:  Chest x-ray 12/01/2013. FINDINGS: Lung volumes are normal. No consolidative airspace disease. No pleural effusions. No pneumothorax. No pulmonary nodule or mass noted. Pulmonary vasculature and the cardiomediastinal silhouette are within normal limits. IMPRESSION: No radiographic evidence of acute cardiopulmonary disease. Electronically Signed   By: Vinnie Langton M.D.   On: 03/01/2016 17:40   I have personally reviewed and evaluated these images and lab results as part of my medical  decision-making.   EKG Interpretation None      MDM   Final diagnoses:  URI (upper respiratory infection)   [name] presents with worsening cough/congestion/fever x 7 days. On exam, faint expiratory wheezing and OP with erythema, no exudates. CXR shows no acute cardiopulmonary disease. Duoneb given in ED. Patient re-evaluated after treatment and feels improved. Lungs with no wheezing. Given duration of symptoms and change in cough to productive, will treat with z-pack. Pt. With hx of of DM- will hold steroids given wheezing resolved with duoneb. Patient had allergy to erythromycin listed - patient stated reaction is diarrhea. Explained this is a common side effect of medication and to take ABX with yogurt. No true allergy to macrolides. PCP follow up encouraged. Return precautions given. All questions answered.     [facility] Ward, PA-C 03/01/16 1947  Leonard Schwartz, MD 03/02/16 [phone]

## 2016-03-02 MED FILL — AZITHROMYCIN 250 MG TABLET: 250 | 5 days supply | Qty: 6 | Fill #0

## 2016-03-03 ENCOUNTER — Other Ambulatory Visit: Payer: Self-pay | Admitting: Internal Medicine

## 2016-03-03 MED FILL — FUROSEMIDE 40 MG TABLET: 40 | 90 days supply | Qty: 90 | Fill #3

## 2016-03-03 MED FILL — PIOGLITAZONE HCL 15 MG TAB: 15 | 90 days supply | Qty: 90 | Fill #0

## 2016-03-03 MED FILL — BUPROPION HCL XL 150 MG TAB: 150 | 90 days supply | Qty: 180 | Fill #2

## 2016-03-04 MED FILL — OxyCONTIN 20 MG T12A: 20 | 30 days supply | Qty: 60 | Fill #0

## 2016-03-04 MED FILL — oxyCODONE HCL 15 MG TABS: 15 | 30 days supply | Qty: 120 | Fill #0

## 2016-03-25 MED FILL — CEVIMELINE HCL 30 MG CAP: 30 | 90 days supply | Qty: 270 | Fill #3

## 2016-03-25 MED FILL — LORazepam 2 MG TABS: 2 | 30 days supply | Qty: 60 | Fill #1

## 2016-04-05 ENCOUNTER — Other Ambulatory Visit: Payer: Self-pay | Admitting: Internal Medicine

## 2016-04-05 MED FILL — CARVEDILOL 25 MG TABLET: 25 | 90 days supply | Qty: 180 | Fill #0

## 2016-04-05 MED FILL — MEDROXYPROGESTERONE 2.5 MG: 2.5 | 90 days supply | Qty: 90 | Fill #1

## 2016-04-05 MED FILL — ESTRADIOL 0.5 MG TABLET: 0.5 | 90 days supply | Qty: 90 | Fill #1

## 2016-04-06 MED FILL — OxyCONTIN 20 MG T12A: 20 | 30 days supply | Qty: 60 | Fill #0

## 2016-04-06 MED FILL — oxyCODONE HCL 15 MG TABS: 15 | 30 days supply | Qty: 120 | Fill #0

## 2016-04-06 MED FILL — MAGIC MW LID/MAAL/DP1:1:1: 2 | 3 days supply | Qty: 120 | Fill #0

## 2016-04-26 MED FILL — metFORMIN HCL 1000 MG TABS: 1000 | 90 days supply | Qty: 180 | Fill #1

## 2016-04-26 MED FILL — PANTOPRAZOLE SOD DR 40 MG T: 40 | 90 days supply | Qty: 90 | Fill #1

## 2016-04-28 ENCOUNTER — Other Ambulatory Visit (INDEPENDENT_AMBULATORY_CARE_PROVIDER_SITE_OTHER): Payer: 59

## 2016-04-28 ENCOUNTER — Encounter: Payer: Self-pay | Admitting: Internal Medicine

## 2016-04-28 ENCOUNTER — Ambulatory Visit (INDEPENDENT_AMBULATORY_CARE_PROVIDER_SITE_OTHER): Payer: 59 | Admitting: Internal Medicine

## 2016-04-28 VITALS — BP 150/84 | HR 85 | Temp 97.9°F | Resp 18

## 2016-04-28 DIAGNOSIS — D649 Anemia, unspecified: Secondary | ICD-10-CM

## 2016-04-28 DIAGNOSIS — M5440 Lumbago with sciatica, unspecified side: Secondary | ICD-10-CM

## 2016-04-28 DIAGNOSIS — G8929 Other chronic pain: Secondary | ICD-10-CM

## 2016-04-28 DIAGNOSIS — M170 Bilateral primary osteoarthritis of knee: Secondary | ICD-10-CM

## 2016-04-28 DIAGNOSIS — R200 Anesthesia of skin: Secondary | ICD-10-CM

## 2016-04-28 LAB — CBC WITH DIFFERENTIAL/PLATELET
Basophils Absolute: 0.1 10*3/uL (ref 0.0–0.1)
Basophils Relative: 0.7 % (ref 0.0–3.0)
Eosinophils Absolute: 0.5 10*3/uL (ref 0.0–0.7)
Eosinophils Relative: 4.3 % (ref 0.0–5.0)
HCT: 31.6 % — ABNORMAL LOW (ref 36.0–46.0)
Hemoglobin: 10.4 g/dL — ABNORMAL LOW (ref 12.0–15.0)
Lymphocytes Relative: 30.4 % (ref 12.0–46.0)
Lymphs Abs: 3.3 10*3/uL (ref 0.7–4.0)
MCHC: 32.9 g/dL (ref 30.0–36.0)
MCV: 89.5 fl (ref 78.0–100.0)
Monocytes Absolute: 0.6 10*3/uL (ref 0.1–1.0)
Monocytes Relative: 5.6 % (ref 3.0–12.0)
Neutro Abs: 6.3 10*3/uL (ref 1.4–7.7)
Neutrophils Relative %: 59 % (ref 43.0–77.0)
Platelets: 251 10*3/uL (ref 150.0–400.0)
RBC: 3.53 Mil/uL — ABNORMAL LOW (ref 3.87–5.11)
RDW: 14.4 % (ref 11.5–15.5)
WBC: 10.7 10*3/uL — ABNORMAL HIGH (ref 4.0–10.5)

## 2016-04-28 LAB — IBC PANEL
Iron: 55 ug/dL (ref 42–145)
Saturation Ratios: 13.7 % — ABNORMAL LOW (ref 20.0–50.0)
Transferrin: 287 mg/dL (ref 212.0–360.0)

## 2016-04-28 LAB — HEPATIC FUNCTION PANEL
ALT: 10 U/L (ref 0–35)
AST: 13 U/L (ref 0–37)
Albumin: 4.2 g/dL (ref 3.5–5.2)
Alkaline Phosphatase: 58 U/L (ref 39–117)
Bilirubin, Direct: 0 mg/dL (ref 0.0–0.3)
Total Bilirubin: 0.3 mg/dL (ref 0.2–1.2)
Total Protein: 7.3 g/dL (ref 6.0–8.3)

## 2016-04-28 LAB — HEMOGLOBIN A1C: Hgb A1c MFr Bld: 7 % — ABNORMAL HIGH (ref 4.6–6.5)

## 2016-04-28 LAB — BASIC METABOLIC PANEL
BUN: 40 mg/dL — ABNORMAL HIGH (ref 6–23)
CO2: 28 mEq/L (ref 19–32)
Calcium: 9.2 mg/dL (ref 8.4–10.5)
Chloride: 97 mEq/L (ref 96–112)
Creatinine, Ser: 2.01 mg/dL — ABNORMAL HIGH (ref 0.40–1.20)
GFR: 26.49 mL/min — ABNORMAL LOW (ref >60.00)
Glucose, Bld: 99 mg/dL (ref 70–99)
Potassium: 4.9 mEq/L (ref 3.5–5.1)
Sodium: 132 mEq/L — ABNORMAL LOW (ref 135–145)

## 2016-04-28 LAB — URIC ACID: Uric Acid, Serum: 8.2 mg/dL — ABNORMAL HIGH (ref 2.4–7.0)

## 2016-04-28 MED ORDER — CYCLOBENZAPRINE HCL 10 MG PO TABS: 30.0000 mg | ORAL_TABLET | Freq: Every day | ORAL | Status: DC

## 2016-04-28 MED ORDER — OXYCODONE HCL ER 20 MG PO T12A: 20.0000 mg | EXTENDED_RELEASE_TABLET | Freq: Two times a day (BID) | ORAL | Status: DC

## 2016-04-28 MED ORDER — OXYCODONE HCL 15 MG PO TABS: 15.0000 mg | ORAL_TABLET | Freq: Four times a day (QID) | ORAL | Status: DC | PRN

## 2016-04-28 MED ORDER — COLCHICINE 0.6 MG PO TABS: 0.6000 mg | ORAL_TABLET | Freq: Every day | ORAL | Status: DC

## 2016-04-28 MED FILL — CYCLOBENZAPRINE 10 MG TAB: 10 | 90 days supply | Qty: 270 | Fill #0

## 2016-04-28 MED FILL — COLCHICINE 0.6 MG TABLET: 0.6 | 30 days supply | Qty: 30 | Fill #0

## 2016-04-28 NOTE — Assessment & Plan Note (Signed)
Pain/swelling - post fx 5/16 Labs Empiric colchicine

## 2016-04-28 NOTE — Assessment & Plan Note (Signed)
S/p B TKR Oxycod and Oxycont  Potential benefits of a long term opioids use as well as potential risks (i.e. addiction risk, apnea etc) and complications (i.e. Somnolence, constipation and others) were explained to the patient and were aknowledged.

## 2016-04-28 NOTE — Progress Notes (Signed)
Subjective:  Patient ID: [name], female    DOB: June 08, 1951  Age: 65 y.o. MRN: [id number]  CC: No chief complaint on file.   HPI [name] presents for knee pain, LBP, DM, HTN, anemia  f/u C/o L foot pain and swelling after a fx x 12 mo  Outpatient Prescriptions Prior to Visit  Medication Sig Dispense Refill  . amoxicillin (AMOXIL) 500 MG capsule Take 4 by mouth one hour prior to dental procedure/cleaning. 16 capsule 1  . buPROPion (WELLBUTRIN XL) 150 MG 24 hr tablet Take 1 tablet (150 mg total) by mouth 2 (two) times daily. 180 tablet 3  . carvedilol (COREG) 25 MG tablet Take 1 tablet (25 mg total) by mouth 2 (two) times daily with a meal. 180 tablet 3  . cevimeline (EVOXAC) 30 MG capsule 2 in the morning and 1 in the evening 270 capsule 3  . cyclobenzaprine (FLEXERIL) 10 MG tablet Take 3 tablets (30 mg total) by mouth at bedtime. (Patient taking differently: Take 20 mg by mouth at bedtime. ) 270 tablet 3  . DULoxetine (CYMBALTA) 60 MG capsule Take 1 capsule (60 mg total) by mouth at bedtime. 90 capsule 3  . estradiol (ESTRACE) 0.5 MG tablet Take 0.5 mg by mouth daily.    . ferrous sulfate 325 (65 FE) MG tablet Take 325 mg by mouth daily with breakfast.    . furosemide (LASIX) 40 MG tablet TAKE 1/2 TO 1 TABLET BY MOUTH ONCE DAILY AS NEEDED FOR SWELLING 90 tablet 3  . glimepiride (AMARYL) 1 MG tablet TAKE 1 TABLET BY MOUTH DAILY BEFORE BREAKFAST. 90 tablet 3  . glucose blood (ONE TOUCH TEST STRIPS) test strip Use as instructed 50 each 3  . ibuprofen (ADVIL,MOTRIN) 200 MG tablet Take 800 mg by mouth 2 (two) times daily as needed (For pain.).    Marland Kitchen Lancets (ONETOUCH ULTRASOFT) lancets Use as instructed 100 each 3  . lidocaine (LIDODERM) 5 % Place 1-3 patches onto the skin daily as needed (Pain). (Patient taking differently: Place 1-3 patches onto the skin daily as needed (For pain.). ) 60 patch 4  . LORazepam (ATIVAN) 2 MG tablet Take 0.5 tablets (1 mg total) by mouth every 6 (six)  hours as needed for anxiety. 60 tablet 3  . LUTEIN PO Take 1 tablet by mouth at bedtime.    . meclizine (ANTIVERT) 12.5 MG tablet Take 1 tablet (12.5 mg total) by mouth 3 (three) times daily as needed for dizziness. 60 tablet 1  . medroxyPROGESTERone (PROVERA) 2.5 MG tablet Take 2.5 mg by mouth daily.    . metFORMIN (GLUCOPHAGE) 1000 MG tablet Take 1 tablet (1,000 mg total) by mouth 2 (two) times daily with a meal. 180 tablet 3  . Multiple Vitamin (MULTIVITAMIN WITH MINERALS) TABS tablet Take 1 tablet by mouth daily.    . Omega-3 300 MG CAPS Take 300 mg by mouth at bedtime.    Marland Kitchen oxybutynin (DITROPAN) 5 MG tablet TAKE 1/2 TABLET BY MOUTH DAILY 45 tablet 3  . oxyCODONE (OXYCONTIN) 20 mg 12 hr tablet Take 1 tablet (20 mg total) by mouth every 12 (twelve) hours. Please fill on or after 03/30/16 60 tablet 0  . oxyCODONE (ROXICODONE) 15 MG immediate release tablet Take 1 tablet (15 mg total) by mouth every 6 (six) hours as needed. Please fill on or after 03/30/16 120 tablet 0  . pantoprazole (PROTONIX) 40 MG tablet Take 1 tablet (40 mg total) by mouth daily. 90 tablet 3  .  pioglitazone (ACTOS) 15 MG tablet TAKE 1 TABLET BY MOUTH ONCE DAILY 90 tablet 3  . polyvinyl alcohol (LIQUIFILM TEARS) 1.4 % ophthalmic solution Place 1 drop into both eyes daily as needed for dry eyes.    . PSYLLIUM PO Take 1 capsule by mouth 2 (two) times daily.    . Vitamin D, Cholecalciferol, 1000 UNITS TABS Take 1,000 Units by mouth daily.    Marland Kitchen azithromycin (ZITHROMAX) 250 MG tablet Take 1 tablet (250 mg total) by mouth daily. Take first 2 tablets together, then 1 every day until finished. (Patient not taking: Reported on 04/28/2016) 6 tablet 0  . famotidine (PEPCID) 10 MG tablet Take 10 mg by mouth 2 (two) times daily. Reported on 04/28/2016    . omeprazole (PRILOSEC) 20 MG capsule Take 1 capsule (20 mg total) by mouth daily. (Patient not taking: Reported on 01/30/2016) 90 capsule 3   No facility-administered medications prior to  visit.    ROS Review of Systems  Constitutional: Negative for chills, activity change, appetite change, fatigue and unexpected weight change.  HENT: Negative for congestion, mouth sores and sinus pressure.   Eyes: Negative for visual disturbance.  Respiratory: Negative for cough and chest tightness.   Gastrointestinal: Negative for nausea and abdominal pain.  Genitourinary: Negative for frequency, difficulty urinating and vaginal pain.  Musculoskeletal: Positive for back pain, joint swelling, arthralgias and gait problem.  Skin: Negative for pallor and rash.  Neurological: Negative for dizziness, tremors, weakness, numbness and headaches.  Psychiatric/Behavioral: Negative for confusion and sleep disturbance.    Objective:  BP 150/84 mmHg  Pulse 85  Temp(Src) 97.9 F (36.6 C) (Oral)  Resp 18  SpO2 96%  BP Readings from Last 3 Encounters:  04/28/16 150/84  03/01/16 136/75  01/30/16 122/84    Wt Readings from Last 3 Encounters:  03/01/16 265 lb (120.203 kg)  01/30/16 261 lb (118.389 kg)  01/28/16 257 lb (116.574 kg)    Physical Exam  Constitutional: She appears well-developed. No distress.  HENT:  Head: Normocephalic.  Right Ear: External ear normal.  Left Ear: External ear normal.  Nose: Nose normal.  Mouth/Throat: Oropharynx is clear and moist.  Eyes: Conjunctivae are normal. Pupils are equal, round, and reactive to light. Right eye exhibits no discharge. Left eye exhibits no discharge.  Neck: Normal range of motion. Neck supple. No JVD present. No tracheal deviation present. No thyromegaly present.  Cardiovascular: Normal rate, regular rhythm and normal heart sounds.   Pulmonary/Chest: No stridor. No respiratory distress. She has no wheezes.  Abdominal: Soft. Bowel sounds are normal. She exhibits no distension and no mass. There is no tenderness. There is no rebound and no guarding.  Musculoskeletal: She exhibits edema and tenderness.  Lymphadenopathy:    She has  no cervical adenopathy.  Neurological: She displays normal reflexes. No cranial nerve deficit. She exhibits normal muscle tone. Coordination normal.  Skin: No rash noted. No erythema.  Psychiatric: She has a normal mood and affect. Her behavior is normal. Judgment and thought content normal.  L foot w/swelling AKs LS, knees - tender tearful  Lab Results  Component Value Date   WBC 14.4* 03/07/2015   HGB 9.2* 03/07/2015   HCT 28.4* 03/07/2015   PLT 252 03/07/2015   GLUCOSE 160* 10/24/2015   CHOL 215* 09/25/2014   TRIG 154.0* 09/25/2014   HDL 46.00 09/25/2014   LDLCALC 138* 09/25/2014   ALT 17 02/26/2015   AST 18 02/26/2015   NA 134* 10/24/2015   K 4.1 10/24/2015  CL 93* 10/24/2015   CREATININE 1.59* 10/24/2015   BUN 48* 10/24/2015   CO2 31 10/24/2015   TSH 1.64 07/25/2013   INR 1.04 02/26/2015   HGBA1C 7.0* 10/24/2015    Dg Chest 2 View  03/01/2016  CLINICAL DATA:  65 year old female with shortness of breath and productive cough for several weeks. EXAM: CHEST  2 VIEW COMPARISON:  Chest x-ray 12/01/2013. FINDINGS: Lung volumes are normal. No consolidative airspace disease. No pleural effusions. No pneumothorax. No pulmonary nodule or mass noted. Pulmonary vasculature and the cardiomediastinal silhouette are within normal limits. IMPRESSION: No radiographic evidence of acute cardiopulmonary disease. Electronically Signed   By: Vinnie Langton M.D.   On: 03/01/2016 17:40    Assessment & Plan:   There are no diagnoses linked to this encounter. I am having Ms. Hinde maintain her onetouch ultrasoft, glucose blood, polyvinyl alcohol, cyclobenzaprine, DULoxetine, lidocaine, Vitamin D (Cholecalciferol), ibuprofen, LUTEIN PO, multivitamin with minerals, Omega-3, PSYLLIUM PO, cevimeline, furosemide, glimepiride, omeprazole, metFORMIN, buPROPion, estradiol, medroxyPROGESTERone, oxybutynin, famotidine, ferrous sulfate, LORazepam, amoxicillin, oxyCODONE, oxyCODONE, meclizine,  pantoprazole, azithromycin, pioglitazone, and carvedilol.  No orders of the defined types were placed in this encounter.     Follow-up: No Follow-up on file.  [name], MD

## 2016-04-28 NOTE — Assessment & Plan Note (Signed)
Chronic disease anemia vs other Last colon 2016 Labs

## 2016-04-28 NOTE — Progress Notes (Signed)
Pre visit review using our clinic review tool, if applicable. No additional management support is needed unless otherwise documented below in the visit note. 

## 2016-05-03 MED FILL — GLIMEPIRIDE 1 MG TABLET: 1 | 90 days supply | Qty: 90 | Fill #0

## 2016-05-03 MED FILL — OXYBUTYNIN 5 MG TABLET: 5 | 90 days supply | Qty: 45 | Fill #2

## 2016-05-06 ENCOUNTER — Other Ambulatory Visit: Payer: Self-pay | Admitting: Internal Medicine

## 2016-05-06 MED FILL — OxyCONTIN 20 MG T12A: 20 | 30 days supply | Qty: 60 | Fill #0

## 2016-05-06 MED FILL — oxyCODONE HCL 15 MG TABS: 15 | 30 days supply | Qty: 120 | Fill #0

## 2016-05-07 ENCOUNTER — Other Ambulatory Visit: Payer: Self-pay | Admitting: *Deleted

## 2016-05-07 DIAGNOSIS — M7989 Other specified soft tissue disorders: Secondary | ICD-10-CM | POA: Diagnosis not present

## 2016-05-07 DIAGNOSIS — M84375D Stress fracture, left foot, subsequent encounter for fracture with routine healing: Secondary | ICD-10-CM | POA: Diagnosis not present

## 2016-05-07 DIAGNOSIS — M25472 Effusion, left ankle: Secondary | ICD-10-CM | POA: Diagnosis not present

## 2016-05-07 MED ORDER — DULOXETINE HCL 60 MG PO CPEP: 60.0000 mg | ORAL_CAPSULE | Freq: Every day | ORAL | Status: DC

## 2016-05-07 MED FILL — DULoxetine HCL 60 MG CPEP: 60 | 90 days supply | Qty: 90 | Fill #0

## 2016-05-07 MED FILL — LIDOCAINE 5% PATCH: 5 | 30 days supply | Qty: 60 | Fill #0

## 2016-05-12 ENCOUNTER — Ambulatory Visit: Payer: 59 | Attending: Sports Medicine

## 2016-05-12 DIAGNOSIS — M25572 Pain in left ankle and joints of left foot: Secondary | ICD-10-CM | POA: Diagnosis not present

## 2016-05-12 DIAGNOSIS — M25672 Stiffness of left ankle, not elsewhere classified: Secondary | ICD-10-CM | POA: Insufficient documentation

## 2016-05-12 DIAGNOSIS — R600 Localized edema: Secondary | ICD-10-CM | POA: Insufficient documentation

## 2016-05-12 DIAGNOSIS — R262 Difficulty in walking, not elsewhere classified: Secondary | ICD-10-CM | POA: Diagnosis not present

## 2016-05-12 NOTE — Patient Instructions (Signed)
Issued  Toe and arch raises and Great toe DF individual  Raise x 10 2-3x/day

## 2016-05-12 NOTE — Therapy (Signed)
[place], [place], [address] Phone: [phone]   Fax:  [phone]  Physical Therapy Evaluation  Patient Details  Name: [name] MRN: [id number] Date of Birth: 09/03/1951 Referring Provider: Vickki Hearing, MD  Encounter Date: 05/12/2016      PT End of Session - 05/12/16 1154    Visit Number 1   Number of Visits 12   Date for PT Re-Evaluation 06/23/16   PT Start Time 1100   PT Stop Time 1145   PT Time Calculation (min) 45 min   Activity Tolerance Patient tolerated treatment well   Behavior During Therapy [facility] for tasks assessed/performed      Past Medical History  Diagnosis Date  . Diabetes mellitus     type II  . GERD (gastroesophageal reflux disease)   . Hypertension   . IBS (irritable bowel syndrome)   . Depression   . Anxiety   . LBP (low back pain)   . Osteoarthritis   . Colitis 2010    microscopic- Dr Henrene Pastor  . Renal insufficiency 2011  . Osteopenia   . Anemia   . Pneumonia     hx of 2014   . Headache   . Sinusitis     currently being treated with antibiotic will complete 03/04/2015  . Neuropathy ([place])     feet bilat     Past Surgical History  Procedure Laterality Date  . Foramen magnum ependymoma surgery  2003    Dr Rita Ohara  . Total knee arthroplasty      L 2008, R 2009, R 2016- Dr Maureen Ralphs  . Joint replacement Bilateral   . Tonsillectomy    . Nasal sinus surgery      1973   . Total knee revision Right 03/05/2015    Procedure: RIGHT TOTAL KNEE ARTHROPLASTY REVISION;  Surgeon: Gaynelle Arabian, MD;  Location: [facility];  Service: Orthopedics;  Laterality: Right;    There were no vitals filed for this visit.       Subjective Assessment - 05/12/16 1112    Subjective She reports stress fracture in LT calcaneous that is healed now per pt by CT scan.She repoorts injuring ankle without  knowing where foot was . This was one year ago. She is here due to pain and swelling. MD  reported some continued swelling but expected pain to be improved. but since not pain due  to tissue injury/plantar fascitis. She carried her Cam walker in today.    Pertinent History Neuropathy, RT TKA and LT TKA   Limitations Standing;Walking   How long can you sit comfortably? As needed   How long can you stand comfortably? As needed   Diagnostic tests CT scan   Patient Stated Goals Walk with less pain   Currently in Pain? Yes   Pain Score 3    Pain Descriptors / Indicators Aching   Pain Onset More than a month ago   Pain Frequency Constant   Aggravating Factors  weight bearing   Pain Relieving Factors meds /ice   Multiple Pain Sites No            [facility] PT Assessment - 05/12/16 1110    Assessment   Medical Diagnosis Plantar fascitis, effusion , Posterior tibial tendonitis, mid foot OA   Referring Provider Vickki Hearing, MD   Onset Date/Surgical Date --  1 year ago   Next MD Visit 6 weeks   Prior Therapy Not for this pain   Precautions  Precautions None   Restrictions   Weight Bearing Restrictions No   Balance Screen   Has the patient fallen in the past 6 months No   Has the patient had a decrease in activity level because of a fear of falling?  No   Is the patient reluctant to leave their home because of a fear of falling?  No   Home Environment   Living Environment Private residence   Living Arrangements Alone   Type of [place] Access Level entry   Prior Function   Level of Independence Independent   Cognition   Overall Cognitive Status Within Functional Limits for tasks assessed   Observation/Other Assessments-Edema    Edema Figure 8   Figure 8 Edema   Figure 8 - Right  55.5 cm  at malleolus 26 CM   Figure 8 - Left  56 cm  at malleolus 28 cm   ROM / Strength   AROM / PROM / Strength AROM;Strength   AROM   Overall AROM Comments Passivve motin LT ankle WNL, Activ e great toe DF with DF ankle RT 70 degrees   LT 50 degrees.    AROM Assessment  Site Ankle   Right/Left Ankle Right;Left   Right Ankle Dorsiflexion 100   Right Ankle Plantar Flexion 68   Right Ankle Inversion 50   Right Ankle Eversion 25   Left Ankle Dorsiflexion 100   Left Ankle Plantar Flexion 46   Left Ankle Inversion 40   Left Ankle Eversion 10   Strength   Overall Strength Comments WNL RT and LT except PF RT WNL LT 4/5 with some pain (also medial arch pain with Inversion   Ambulation/Gait   Gait Comments walks antalgic /limp LT and trunk sway bilaterally, over pronation LT ankle                            PT Education - 05/12/16 1141    Education provided Yes   Education Details POC fot exercises.    Person(s) Educated Patient   Methods Explanation;Demonstration;Verbal cues;Handout   Comprehension Returned demonstration;Verbalized understanding          PT Short Term Goals - 05/12/16 1400    PT SHORT TERM GOAL #1   Title She will be independent with inital HEP   Time 2   Period Weeks   Status New   PT SHORT TERM GOAL #2   Title She will report 25% decreased pain with walking   Time 3   Period Weeks   Status New           PT Long Term Goals - 05/12/16 1401    PT LONG TERM GOAL #1   Title She wil be able to demo all HEP issued    Time 6   Period Weeks   Status New   PT LONG TERM GOAL #2   Title she will report pain LT foot and ankle decreased 50% or more    Time 6   Period Weeks   Status New   PT LONG TERM GOAL #3   Title ROM active LT foot and ankle equal RT   Time 6   Period Weeks   Status New   PT LONG TERM GOAL #4   Title She will report no need for CAM walker for pain releif   Time 6   Period Weeks   Status New  Plan - 05/12/16 1154    Clinical Impression Statement [name] presents with low complexity evaluation  for LT foot and anke pain . She has pain in medial and laterla ankle and through the plantar aspect of Lt foot. She had a calcaneal fractue a year ago and she has had  pain since that time.  She ahs some mild LT ankle stiffness , stiffness of great toe and swelling LT > RT ankle.  When walking she also has increased pronatilon LT ankle. she has expensive supportive shoes and report recieving arch supports. She uses CAM walker for support and she reports pain releif   Rehab Potential Good   PT Frequency 2x / week   PT Duration 6 weeks   PT Treatment/Interventions Cryotherapy;Iontophoresis 4mg /ml Dexamethasone;Moist Heat;Ultrasound;Therapeutic exercise;Patient/family education;Manual techniques;Taping;Dry needling;Passive range of motion   PT Next Visit Plan Modalities, STW stretching, isometrics VS band exercises   PT Home Exercise Plan stretching and foot strengthening   Consulted and Agree with Plan of Care Patient      Patient will benefit from skilled therapeutic intervention in order to improve the following deficits and impairments:  Difficulty walking, Pain, Increased edema, Decreased activity tolerance, Decreased range of motion  Visit Diagnosis: Pain in left ankle and joints of left foot - Plan: PT plan of care cert/re-cert  Difficulty in walking, not elsewhere classified - Plan: PT plan of care cert/re-cert  Localized edema - Plan: PT plan of care cert/re-cert  Stiffness of left ankle, not elsewhere classified - Plan: PT plan of care cert/re-cert     Problem List Patient Active Problem List   Diagnosis Date Noted  . 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 OA (osteoarthritis) of knee 03/05/2015  . Rib pain on left side 02/27/2015  . URI, acute 09/17/2013  . Swelling of limb-Bilateral leg Left > than right 07/11/2013  . Numbness-Left foot 07/11/2013  . Chronic venous insufficiency 04/19/2013  . Left hand pain 02/09/2013  . Right shoulder pain 09/12/2012  . Edema 04/24/2012  . Grief 11/10/2011  . ABSCESS, TOOTH 02/03/2011  . OSTEOPENIA 10/28/2010  . HYPERKALEMIA 07/15/2010  . ARTHRALGIA 04/08/2010  . FATIGUE  04/08/2010  . XEROSTOMIA 02/04/2010  . ECZEMA 10/29/2009  . TOBACCO USE, QUIT 10/29/2009  . Anemia 06/18/2009  . 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 Diarrhea 06/18/2009  . OPACITY, VITREOUS HUMOR 01/15/2009  . COLITIS 10/16/2008  . KNEE PAIN 04/10/2008  . HYPERCHOLESTEROLEMIA 01/10/2008  . ANXIETY 11/08/2007  . Depression 11/08/2007  . Osteoarthritis 11/08/2007  . LOW BACK PAIN 11/08/2007  . Diabetes mellitus type 2, controlled ([place]) 10/07/2007  . Essential hypertension 10/07/2007  . GERD 10/07/2007  . MICROALBUMINURIA 10/07/2007    Darrel Hoover  PT 05/12/2016, 2:06 PM  [facility] [address] [place], [place], [address] Phone: [phone]   Fax:  [phone]  Name: ELIZABETHROSE JOHANSEN MRN: [id number] Date of Birth: 1951-02-13

## 2016-05-18 ENCOUNTER — Ambulatory Visit: Payer: 59 | Attending: Sports Medicine | Admitting: Physical Therapy

## 2016-05-18 DIAGNOSIS — R262 Difficulty in walking, not elsewhere classified: Secondary | ICD-10-CM | POA: Diagnosis not present

## 2016-05-18 DIAGNOSIS — M25672 Stiffness of left ankle, not elsewhere classified: Secondary | ICD-10-CM | POA: Diagnosis not present

## 2016-05-18 DIAGNOSIS — M25572 Pain in left ankle and joints of left foot: Secondary | ICD-10-CM | POA: Insufficient documentation

## 2016-05-18 DIAGNOSIS — R600 Localized edema: Secondary | ICD-10-CM | POA: Insufficient documentation

## 2016-05-18 DIAGNOSIS — R29898 Other symptoms and signs involving the musculoskeletal system: Secondary | ICD-10-CM | POA: Insufficient documentation

## 2016-05-18 NOTE — Therapy (Signed)
[place], [place], [address] Phone: [phone]   Fax:  [phone]  Physical Therapy Treatment  Patient Details  Name: [name] MRN: [id number] Date of Birth: [birth year] Referring Provider: Vickki Hearing, MD  Encounter Date: 05/18/2016      PT End of Session - 05/18/16 1741    Visit Number 2   Number of Visits 12   Date for PT Re-Evaluation 06/23/16   PT Start Time [id number]   PT Stop Time 1505   PT Time Calculation (min) 47 min   Activity Tolerance Patient tolerated treatment well   Behavior During Therapy [facility] for tasks assessed/performed      Past Medical History  Diagnosis Date  . Diabetes mellitus     type II  . GERD (gastroesophageal reflux disease)   . Hypertension   . IBS (irritable bowel syndrome)   . Depression   . Anxiety   . LBP (low back pain)   . Osteoarthritis   . Colitis 2010    microscopic- Dr Henrene Pastor  . Renal insufficiency 2011  . Osteopenia   . Anemia   . Pneumonia     hx of 2014   . Headache   . Sinusitis     currently being treated with antibiotic will complete 03/04/2015  . Neuropathy ([place])     feet bilat     Past Surgical History  Procedure Laterality Date  . Foramen magnum ependymoma surgery  2003    Dr Rita Ohara  . Total knee arthroplasty      L 2008, R 2009, R 2016- Dr Maureen Ralphs  . Joint replacement Bilateral   . Tonsillectomy    . Nasal sinus surgery      1973   . Total knee revision Right 03/05/2015    Procedure: RIGHT TOTAL KNEE ARTHROPLASTY REVISION;  Surgeon: Gaynelle Arabian, MD;  [facility];  Service: Orthopedics;  Laterality: Right;    There were no vitals filed for this visit.      Subjective Assessment - 05/18/16 1434    Subjective Toes are numb,  Pain worse at night.    Currently in Pain? Yes   Pain Score 3    Pain Location Ankle   Pain Orientation Left   Pain Descriptors / Indicators Aching;Numbness;Tingling   Pain Frequency Constant    Aggravating Factors  walking, worse at night   Pain Relieving Factors meds, ice                         [facility] Adult PT Treatment/Exercise - 05/18/16 0001    Manual Therapy   Manual therapy comments 2 fans for edema control,  LT,  manual retrograde ,  PROM stretchinf for toes, foot both Flexion and Extension   Ankle Exercises: Supine   Isometrics 4 way 10 X 5 seconds   Other Supine Ankle Exercises toe moving.                  PT Short Term Goals - 05/18/16 1746    PT SHORT TERM GOAL #1   Title She will be independent with inital HEP   Time 2   Period Weeks   Status On-going   PT SHORT TERM GOAL #2   Title She will report 25% decreased pain with walking   Baseline no pain change yet   Time 3   Period Weeks   Status On-going  PT Long Term Goals - 05/12/16 1401    PT LONG TERM GOAL #1   Title She wil be able to demo all HEP issued    Time 6   Period Weeks   Status New   PT LONG TERM GOAL #2   Title she will report pain LT foot and ankle decreased 50% or more    Time 6   Period Weeks   Status New   PT LONG TERM GOAL #3   Title ROM active LT foot and ankle equal RT   Time 6   Period Weeks   Status New   PT LONG TERM GOAL #4   Title She will report no need for CAM walker for pain releif   Time 6   Period Weeks   Status New               Plan - 05/18/16 1743    Clinical Impression Statement Patient was wearing shoes today .  Her gait has not yet changed.  A compression sock has helped edema.  Foot was stiff.  Main focus was on stretching/manual.  Patient was worried she was going to loose motion,  She was trarful at one point due to worry about her foot motion.  Emotional support given.     PT Next Visit Plan Rocker board, tennis ball under foot to stretch.  Continue Isonetrica and stretching.    PT Home Exercise Plan stretching and foot strengtheningcontinue, no new added   Consulted and Agree with Plan of Care Patient       Patient will benefit from skilled therapeutic intervention in order to improve the following deficits and impairments:  Difficulty walking, Pain, Increased edema, Decreased activity tolerance, Decreased range of motion  Visit Diagnosis: Pain in left ankle and joints of left foot  Difficulty in walking, not elsewhere classified  Localized edema  Stiffness of left ankle, not elsewhere classified     Problem List Patient Active Problem List   Diagnosis Date Noted  . Failed total right knee replacement ([place]) 03/05/2015  . OA (osteoarthritis) of knee 03/05/2015  . Rib pain on left side 02/27/2015  . URI, acute 09/17/2013  . Swelling of limb-Bilateral leg Left > than right 07/11/2013  . Numbness-Left foot 07/11/2013  . Chronic venous insufficiency 04/19/2013  . Left hand pain 02/09/2013  . Right shoulder pain 09/12/2012  . Edema 04/24/2012  . Grief 11/10/2011  . ABSCESS, TOOTH 02/03/2011  . OSTEOPENIA 10/28/2010  . HYPERKALEMIA 07/15/2010  . ARTHRALGIA 04/08/2010  . FATIGUE 04/08/2010  . XEROSTOMIA 02/04/2010  . ECZEMA 10/29/2009  . TOBACCO USE, QUIT 10/29/2009  . Anemia 06/18/2009  . 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 Diarrhea 06/18/2009  . OPACITY, VITREOUS HUMOR 01/15/2009  . COLITIS 10/16/2008  . KNEE PAIN 04/10/2008  . HYPERCHOLESTEROLEMIA 01/10/2008  . ANXIETY 11/08/2007  . Depression 11/08/2007  . Osteoarthritis 11/08/2007  . LOW BACK PAIN 11/08/2007  . Diabetes mellitus type 2, controlled ([place]) 10/07/2007  . Essential hypertension 10/07/2007  . GERD 10/07/2007  . MICROALBUMINURIA 10/07/2007    HARRIS,KAREN 05/18/2016, 5:48 PM  [facility] [address] [place], [place], [address] Phone: [phone]   Fax:  [phone]  Name: MERRISSA GERVASI MRN: [id number] Date of Birth: August 18, 1951  Melvenia Needles, PTA 05/18/2016 5:48 PM Phone: [phone] Fax: [phone]

## 2016-05-20 DIAGNOSIS — H52223 Regular astigmatism, bilateral: Secondary | ICD-10-CM | POA: Diagnosis not present

## 2016-05-20 DIAGNOSIS — H5201 Hypermetropia, right eye: Secondary | ICD-10-CM | POA: Diagnosis not present

## 2016-05-20 DIAGNOSIS — H5212 Myopia, left eye: Secondary | ICD-10-CM | POA: Diagnosis not present

## 2016-05-20 DIAGNOSIS — E119 Type 2 diabetes mellitus without complications: Secondary | ICD-10-CM | POA: Diagnosis not present

## 2016-05-20 DIAGNOSIS — H524 Presbyopia: Secondary | ICD-10-CM | POA: Diagnosis not present

## 2016-05-24 ENCOUNTER — Ambulatory Visit: Payer: 59 | Admitting: Physical Therapy

## 2016-05-24 DIAGNOSIS — R29898 Other symptoms and signs involving the musculoskeletal system: Secondary | ICD-10-CM | POA: Diagnosis not present

## 2016-05-24 DIAGNOSIS — M25572 Pain in left ankle and joints of left foot: Secondary | ICD-10-CM | POA: Diagnosis not present

## 2016-05-24 DIAGNOSIS — M25672 Stiffness of left ankle, not elsewhere classified: Secondary | ICD-10-CM | POA: Diagnosis not present

## 2016-05-24 DIAGNOSIS — R262 Difficulty in walking, not elsewhere classified: Secondary | ICD-10-CM

## 2016-05-24 DIAGNOSIS — R600 Localized edema: Secondary | ICD-10-CM

## 2016-05-24 NOTE — Patient Instructions (Signed)
ANKLE: Inversion, Unilateral (Band)    Placing band around left foot. Keeping heel in place, lift toes of banded foot up and in. Do not move hip. Hold _1__ seconds. Use __red______ band._10__ reps per set, 1-2___ sets per day, _7__ days per week    For eversion, lopop red band around both feet,  Turn Left foot out.  Right foot stays still.  1 X a day, 20 X each. Copyright  VHI. All rights reserved.

## 2016-05-24 NOTE — Therapy (Signed)
[place], [place], [address] Phone: [phone]   Fax:  [phone]  Physical Therapy Treatment  Patient Details  Name: [name] MRN: [id number] Date of Birth: Sep 12, 1951 Referring Provider: Vickki Hearing, MD  Encounter Date: 05/24/2016      PT End of Session - 05/24/16 1328    Visit Number 3   Number of Visits 12   Date for PT Re-Evaluation 06/23/16   PT Start Time 1108   PT Stop Time 1205   PT Time Calculation (min) 57 min   Activity Tolerance Patient tolerated treatment well   Behavior During Therapy [facility] for tasks assessed/performed      Past Medical History  Diagnosis Date  . Diabetes mellitus     type II  . GERD (gastroesophageal reflux disease)   . Hypertension   . IBS (irritable bowel syndrome)   . Depression   . Anxiety   . LBP (low back pain)   . Osteoarthritis   . Colitis 2010    microscopic- Dr Henrene Pastor  . Renal insufficiency 2011  . Osteopenia   . Anemia   . Pneumonia     hx of 2014   . Headache   . Sinusitis     currently being treated with antibiotic will complete 03/04/2015  . Neuropathy ([place])     feet bilat     Past Surgical History  Procedure Laterality Date  . Foramen magnum ependymoma surgery  2003    Dr Rita Ohara  . Total knee arthroplasty      L 2008, R 2009, R 2016- Dr Maureen Ralphs  . Joint replacement Bilateral   . Tonsillectomy    . Nasal sinus surgery      1973   . Total knee revision Right 03/05/2015    Procedure: RIGHT TOTAL KNEE ARTHROPLASTY REVISION;  Surgeon: Gaynelle Arabian, MD;  [facility];  Service: Orthopedics;  Laterality: Right;    There were no vitals filed for this visit.      Subjective Assessment - 05/24/16 1107    Subjective 3-4/10 pain now,  gets up to 7/10.  3/10 now.  I have not been up that long.   Currently in Pain? Yes   Pain Score 3   up to 7/10   Pain Location Ankle   Pain Orientation Left   Pain Descriptors / Indicators  Aching;Sharp  swollen   Aggravating Factors  worse with walking   Pain Relieving Factors rest, meds,  ice                         [facility] Adult PT Treatment/Exercise - 05/24/16 1114    Cryotherapy   Number Minutes Cryotherapy 10 Minutes   Cryotherapy Location Ankle   Type of Cryotherapy --  cold pack   Manual Therapy   Manual therapy comments 2 fans and an I strip to assist with edema and neutral foot.  after manual to    Ankle Exercises: Stretches   Plantar Fascia Stretch --  with tennis ball, 2 minutes   Other Stretch rockerboard 10 X  PF/DF   Ankle Exercises: Seated   Heel Raises 10 reps  keeping toes on floor.     Other Seated Ankle Exercises Band red IV/EV 10 X each, reds   Ankle Exercises: Supine   T-Band IV/EV red added to home exercise program   Other Supine Ankle Exercises Manual resistance 4 way 10 X each  PT Education - 05/24/16 1326    Education provided Yes   Education Details Band IV/EV   Person(s) Educated Patient   Methods Explanation;Demonstration;Tactile cues;Verbal cues;Handout   Comprehension Verbalized understanding;Returned demonstration;Need further instruction          PT Short Term Goals - 05/24/16 1113    PT SHORT TERM GOAL #1   Title She will be independent with inital HEP   Baseline does her exercises   Time 2   Period Weeks   Status Achieved   PT SHORT TERM GOAL #2   Title She will report 25% decreased pain with walking   Baseline no pain change yet,  pain changes places   Time 3   Period Weeks   Status On-going           PT Long Term Goals - 05/12/16 1401    PT LONG TERM GOAL #1   Title She wil be able to demo all HEP issued    Time 6   Period Weeks   Status New   PT LONG TERM GOAL #2   Title she will report pain LT foot and ankle decreased 50% or more    Time 6   Period Weeks   Status New   PT LONG TERM GOAL #3   Title ROM active LT foot and ankle equal RT   Time 6   Period  Weeks   Status New   PT LONG TERM GOAL #4   Title She will report no need for CAM walker for pain releif   Time 6   Period Weeks   Status New               Plan - 05/24/16 1328    Clinical Impression Statement Heel pain improving.  Able to progress home exercises.  No new goals met   PT Next Visit Plan review band work.  Try incline board stretch   PT Home Exercise Plan IV/EV band   Consulted and Agree with Plan of Care Patient      Patient will benefit from skilled therapeutic intervention in order to improve the following deficits and impairments:  Difficulty walking, Pain, Increased edema, Decreased activity tolerance, Decreased range of motion  Visit Diagnosis: Pain in left ankle and joints of left foot  Difficulty in walking, not elsewhere classified  Localized edema  Stiffness of left ankle, not elsewhere classified     Problem List Patient Active Problem List   Diagnosis Date Noted  . 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 OA (osteoarthritis) of knee 03/05/2015  . Rib pain on left side 02/27/2015  . URI, acute 09/17/2013  . Swelling of limb-Bilateral leg Left > than right 07/11/2013  . Numbness-Left foot 07/11/2013  . Chronic venous insufficiency 04/19/2013  . Left hand pain 02/09/2013  . Right shoulder pain 09/12/2012  . Edema 04/24/2012  . Grief 11/10/2011  . ABSCESS, TOOTH 02/03/2011  . OSTEOPENIA 10/28/2010  . HYPERKALEMIA 07/15/2010  . ARTHRALGIA 04/08/2010  . FATIGUE 04/08/2010  . XEROSTOMIA 02/04/2010  . ECZEMA 10/29/2009  . TOBACCO USE, QUIT 10/29/2009  . Anemia 06/18/2009  . 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 Diarrhea 06/18/2009  . OPACITY, VITREOUS HUMOR 01/15/2009  . COLITIS 10/16/2008  . KNEE PAIN 04/10/2008  . HYPERCHOLESTEROLEMIA 01/10/2008  . ANXIETY 11/08/2007  . Depression 11/08/2007  . Osteoarthritis 11/08/2007  . LOW BACK PAIN 11/08/2007  . Diabetes mellitus type 2, controlled  ([place]) 10/07/2007  . Essential  hypertension 10/07/2007  . GERD 10/07/2007  . MICROALBUMINURIA 10/07/2007    HARRIS,KAREN 05/24/2016, 1:30 PM  [facility] [address] [place], [place], [address] Phone: [phone]   Fax:  [phone]  Name: LATYSHA THACKSTON MRN: [id number] Date of Birth: [birth year]    Melvenia Needles, PTA 05/24/2016 1:30 PM Phone: [phone] Fax: [phone]

## 2016-05-24 NOTE — Therapy (Signed)
[place], [place], [address] Phone: [phone]   Fax:  [phone]  Physical Therapy Treatment  Patient Details  Name: [name] MRN: [id number] Date of Birth: 10/19/1951 Referring Provider: Vickki Hearing, MD  Encounter Date: 05/24/2016      PT End of Session - 05/24/16 1328    Visit Number 3   Number of Visits 12   Date for PT Re-Evaluation 06/23/16   PT Start Time 1108   PT Stop Time 1205   PT Time Calculation (min) 57 min   Activity Tolerance Patient tolerated treatment well   Behavior During Therapy [facility] for tasks assessed/performed      Past Medical History  Diagnosis Date  . Diabetes mellitus     type II  . GERD (gastroesophageal reflux disease)   . Hypertension   . IBS (irritable bowel syndrome)   . Depression   . Anxiety   . LBP (low back pain)   . Osteoarthritis   . Colitis 2010    microscopic- Dr Henrene Pastor  . Renal insufficiency 2011  . Osteopenia   . Anemia   . Pneumonia     hx of 2014   . Headache   . Sinusitis     currently being treated with antibiotic will complete 03/04/2015  . Neuropathy ([place])     feet bilat     Past Surgical History  Procedure Laterality Date  . Foramen magnum ependymoma surgery  2003    Dr Rita Ohara  . Total knee arthroplasty      L 2008, R 2009, R 2016- Dr Maureen Ralphs  . Joint replacement Bilateral   . Tonsillectomy    . Nasal sinus surgery      1973   . Total knee revision Right 03/05/2015    Procedure: RIGHT TOTAL KNEE ARTHROPLASTY REVISION;  Surgeon: Gaynelle Arabian, MD;  Location: [facility];  Service: Orthopedics;  Laterality: Right;    There were no vitals filed for this visit.      Subjective Assessment - 05/24/16 1107    Subjective 3-4/10 pain now,  gets up to 7/10.  3/10 now.  I have not been up that long.   Currently in Pain? Yes   Pain Score 3   up to 7/10   Pain Location Ankle   Pain Orientation Left   Pain Descriptors / Indicators  Aching;Sharp  swollen   Aggravating Factors  worse with walking   Pain Relieving Factors rest, meds,  ice                         [facility] Adult PT Treatment/Exercise - 05/24/16 1114    Cryotherapy   Number Minutes Cryotherapy 10 Minutes   Cryotherapy Location Ankle   Type of Cryotherapy --  cold pack   Manual Therapy   Manual therapy comments 2 fans and an I strip to assist with edema and neutral foot.  after manual to    Ankle Exercises: Stretches   Plantar Fascia Stretch --  with tennis ball, 2 minutes   Other Stretch rockerboard 10 X  PF/DF   Ankle Exercises: Seated   Heel Raises 10 reps  keeping toes on floor.     Other Seated Ankle Exercises Band red IV/EV 10 X each, reds   Ankle Exercises: Supine   T-Band IV/EV red added to home exercise program   Other Supine Ankle Exercises Manual resistance 4 way 10 X each  PT Education - 05/24/16 1326    Education provided Yes   Education Details Band IV/EV   Person(s) Educated Patient   Methods Explanation;Demonstration;Tactile cues;Verbal cues;Handout   Comprehension Verbalized understanding;Returned demonstration;Need further instruction          PT Short Term Goals - 05/24/16 1113    PT SHORT TERM GOAL #1   Title She will be independent with inital HEP   Baseline does her exercises   Time 2   Period Weeks   Status Achieved   PT SHORT TERM GOAL #2   Title She will report 25% decreased pain with walking   Baseline no pain change yet,  pain changes places   Time 3   Period Weeks   Status On-going           PT Long Term Goals - 05/12/16 1401    PT LONG TERM GOAL #1   Title She wil be able to demo all HEP issued    Time 6   Period Weeks   Status New   PT LONG TERM GOAL #2   Title she will report pain LT foot and ankle decreased 50% or more    Time 6   Period Weeks   Status New   PT LONG TERM GOAL #3   Title ROM active LT foot and ankle equal RT   Time 6   Period  Weeks   Status New   PT LONG TERM GOAL #4   Title She will report no need for CAM walker for pain releif   Time 6   Period Weeks   Status New               Plan - 05/24/16 1328    Clinical Impression Statement Heel pain improving.  Able to progress home exercises.  No new goals met   PT Next Visit Plan review band work.  Try incline board stretch   PT Home Exercise Plan IV/EV band   Consulted and Agree with Plan of Care Patient      Patient will benefit from skilled therapeutic intervention in order to improve the following deficits and impairments:  Difficulty walking, Pain, Increased edema, Decreased activity tolerance, Decreased range of motion  Visit Diagnosis: Pain in left ankle and joints of left foot  Difficulty in walking, not elsewhere classified  Localized edema  Stiffness of left ankle, not elsewhere classified     Problem List Patient Active Problem List   Diagnosis Date Noted  . Failed total right knee replacement ([name]) 03/05/2015  . OA (osteoarthritis) of knee 03/05/2015  . Rib pain on left side 02/27/2015  . URI, acute 09/17/2013  . Swelling of limb-Bilateral leg Left > than right 07/11/2013  . Numbness-Left foot 07/11/2013  . Chronic venous insufficiency 04/19/2013  . Left hand pain 02/09/2013  . Right shoulder pain 09/12/2012  . Edema 04/24/2012  . Grief 11/10/2011  . ABSCESS, TOOTH 02/03/2011  . OSTEOPENIA 10/28/2010  . HYPERKALEMIA 07/15/2010  . ARTHRALGIA 04/08/2010  . FATIGUE 04/08/2010  . XEROSTOMIA 02/04/2010  . ECZEMA 10/29/2009  . TOBACCO USE, QUIT 10/29/2009  . Anemia 06/18/2009  . 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 Diarrhea 06/18/2009  . OPACITY, VITREOUS HUMOR 01/15/2009  . COLITIS 10/16/2008  . KNEE PAIN 04/10/2008  . HYPERCHOLESTEROLEMIA 01/10/2008  . ANXIETY 11/08/2007  . Depression 11/08/2007  . Osteoarthritis 11/08/2007  . LOW BACK PAIN 11/08/2007  . Diabetes mellitus type 2, controlled  ([name]) 10/07/2007  . Essential  hypertension 10/07/2007  . GERD 10/07/2007  . MICROALBUMINURIA 10/07/2007    HARRIS,KAREN 05/24/2016, 3:38 PM  [facility] [address] [place], [place], [address] Phone: [phone]   Fax:  [phone]  Name: [name] MRN: [id number] Date of Birth: 04-26-1951    Melvenia Needles, PTA 05/24/2016 3:38 PM Phone: [phone] Fax: [phone]

## 2016-05-26 ENCOUNTER — Encounter: Payer: 59 | Admitting: Physical Therapy

## 2016-05-31 ENCOUNTER — Ambulatory Visit: Payer: 59 | Admitting: Physical Therapy

## 2016-05-31 DIAGNOSIS — R29898 Other symptoms and signs involving the musculoskeletal system: Secondary | ICD-10-CM | POA: Diagnosis not present

## 2016-05-31 DIAGNOSIS — M25572 Pain in left ankle and joints of left foot: Secondary | ICD-10-CM | POA: Diagnosis not present

## 2016-05-31 DIAGNOSIS — M25672 Stiffness of left ankle, not elsewhere classified: Secondary | ICD-10-CM | POA: Diagnosis not present

## 2016-05-31 DIAGNOSIS — R262 Difficulty in walking, not elsewhere classified: Secondary | ICD-10-CM

## 2016-05-31 DIAGNOSIS — R600 Localized edema: Secondary | ICD-10-CM

## 2016-05-31 MED FILL — LORazepam 2 MG TABS: 2 | 30 days supply | Qty: 60 | Fill #2

## 2016-05-31 MED FILL — PIOGLITAZONE HCL 15 MG TAB: 15 | 90 days supply | Qty: 90 | Fill #1

## 2016-05-31 NOTE — Therapy (Signed)
[facility] [address] [place], [place], [address] Phone: [phone]   Fax:  [phone]  Physical Therapy Treatment  Patient Details  Name: [name] MRN: [id number] Date of Birth: [birth year] Referring Provider: Rodolph Bong, MD  Encounter Date: 05/31/2016      PT End of Session - 05/31/16 1128    Visit Number 4   Number of Visits 12   Date for PT Re-Evaluation 06/23/16   PT Start Time 1017   PT Stop Time 1115   PT Time Calculation (min) 58 min   Activity Tolerance Patient tolerated treatment well   Behavior During Therapy [facility] for tasks assessed/performed      Past Medical History  Diagnosis Date  . Diabetes mellitus     type II  . GERD (gastroesophageal reflux disease)   . Hypertension   . IBS (irritable bowel syndrome)   . Depression   . Anxiety   . LBP (low back pain)   . Osteoarthritis   . Colitis 2010    microscopic- Dr Marina Goodell  . Renal insufficiency 2011  . Osteopenia   . Anemia   . Pneumonia     hx of 2014   . Headache   . Sinusitis     currently being treated with antibiotic will complete 03/04/2015  . Neuropathy ([name])     feet bilat     Past Surgical History  Procedure Laterality Date  . Foramen magnum ependymoma surgery  2003    Dr Jule Ser  . Total knee arthroplasty      L 2008, R 2009, R 2016- Dr Despina Hick  . Joint replacement Bilateral   . Tonsillectomy    . Nasal sinus surgery      1973   . Total knee revision Right 03/05/2015    Procedure: RIGHT TOTAL KNEE ARTHROPLASTY REVISION;  Surgeon: Ollen Gross, MD;  Location: [facility];  Service: Orthopedics;  Laterality: Right;    There were no vitals filed for this visit.      Subjective Assessment - 05/31/16 1022    Subjective She has heel pain some days  depending on what she does.  Able to go to grocery store last night and today no am pain (20 minutes)  She wears compression sock.  She has weaned from boot completely    Currently in  Pain? No/denies   Pain Score 3    Pain Location Ankle   Pain Orientation Left   Pain Descriptors / Indicators Aching   Pain Frequency Intermittent   Aggravating Factors  time on feet   Pain Relieving Factors time on feet                         [facility] Adult PT Treatment/Exercise - 05/31/16 1032    Cryotherapy   Number Minutes Cryotherapy 15 Minutes   Cryotherapy Location Ankle   Type of Cryotherapy --  vasopneumatic,  32 degrees,  moderate pressure.    Ankle Exercises: Aerobic   Stationary Bike Nustep 6 minutes,  L5   Ankle Exercises: Stretches   Slant Board Stretch 3 reps;30 seconds  some discomfort with this   Other Stretch pro stretch 10 X 10 seconds, both ways.    Ankle Exercises: Seated   Towel Crunch --  10 x   BAPS Sitting;Level 2  3;00-9:00 POSITIONS   Other Seated Ankle Exercises bAND iv/ev 10 x EACH,  lITTLE ROM with IV   Other Seated Ankle Exercises toe  YOGA,  lift great toe only,  Lift toes only keeping great toe down  unable to keep great toe down                PT Education - 05/31/16 1127    Education provided Yes   Education Details gastroc stretch, standing   Person(s) Educated Patient   Methods Explanation;Demonstration;Verbal cues;Handout   Comprehension Verbalized understanding;Returned demonstration          PT Short Term Goals - 05/31/16 1131    PT SHORT TERM GOAL #1   Title She will be independent with inital HEP   Status Achieved   PT SHORT TERM GOAL #2   Title She will report 25% decreased pain with walking   Baseline improving, no % given.   Time 3   Period Weeks   Status On-going           PT Long Term Goals - 05/31/16 1131    PT LONG TERM GOAL #1   Title She wil be able to demo all HEP issued    Time 6   Period Weeks   Status On-going   PT LONG TERM GOAL #2   Title she will report pain LT foot and ankle decreased 50% or more    Time 6   Period Weeks   Status On-going   PT LONG TERM GOAL #3    Title ROM active LT foot and ankle equal RT   Time 6   Period Weeks   Status Unable to assess   PT LONG TERM GOAL #4   Title She will report no need for CAM walker for pain releif   Baseline no longer using(05/31/2016)   Time 6   Period Weeks   Status Achieved               Plan - 05/31/16 1128    Clinical Impression Statement Pain improving.  Patient unable to tell % of improvement.  Frequancy and intensity improving.  Edema improving.  LTG#4 met.    PT Next Visit Plan closed chain try heel lifts.  Patient wants to work some balance work. (She has to sit down to don pants for balance and toes get cought on leg of pants)   PT Home Exercise Plan gastroc stretch, standing.   Consulted and Agree with Plan of Care Patient      Patient will benefit from skilled therapeutic intervention in order to improve the following deficits and impairments:  Difficulty walking, Pain, Increased edema, Decreased activity tolerance, Decreased range of motion  Visit Diagnosis: Pain in left ankle and joints of left foot  Difficulty in walking, not elsewhere classified  Localized edema  Stiffness of left ankle, not elsewhere classified  Weakness of right leg     Problem List Patient Active Problem List   Diagnosis Date Noted  . Failed total right knee replacement ([place]) 03/05/2015  . OA (osteoarthritis) of knee 03/05/2015  . Rib pain on left side 02/27/2015  . URI, acute 09/17/2013  . Swelling of limb-Bilateral leg Left > than right 07/11/2013  . Numbness-Left foot 07/11/2013  . Chronic venous insufficiency 04/19/2013  . Left hand pain 02/09/2013  . Right shoulder pain 09/12/2012  . Edema 04/24/2012  . Grief 11/10/2011  . ABSCESS, TOOTH 02/03/2011  . OSTEOPENIA 10/28/2010  . HYPERKALEMIA 07/15/2010  . ARTHRALGIA 04/08/2010  . FATIGUE 04/08/2010  . XEROSTOMIA 02/04/2010  . ECZEMA 10/29/2009  . TOBACCO USE, QUIT 10/29/2009  . Anemia 06/18/2009  .  Disorder resulting from  impaired renal function 06/18/2009  . Diarrhea 06/18/2009  . OPACITY, VITREOUS HUMOR 01/15/2009  . COLITIS 10/16/2008  . KNEE PAIN 04/10/2008  . HYPERCHOLESTEROLEMIA 01/10/2008  . ANXIETY 11/08/2007  . Depression 11/08/2007  . Osteoarthritis 11/08/2007  . LOW BACK PAIN 11/08/2007  . Diabetes mellitus type 2, controlled ([place]) 10/07/2007  . Essential hypertension 10/07/2007  . GERD 10/07/2007  . MICROALBUMINURIA 10/07/2007    HARRIS,KAREN 05/31/2016, 11:34 AM  [facility] [address] [place], [place], [address] Phone: [phone]   Fax:  [phone]  Name: [name] MRN: [id number] Date of Birth: 31-Aug-1951    Melvenia Needles, PTA 05/31/2016 11:34 AM Phone: [phone] Fax: [phone]

## 2016-05-31 NOTE — Patient Instructions (Addendum)
Achilles / Gastroc, Standing    Stand, one foot behind, heel on floor and turned slightly out, leg straight, forward leg bent. Move hips forward. Hold _30__ seconds. Repeat3__ times per session. Do 1___ sessions per day.  Repeat with other leg.  Copyright  VHI. All rights reserved.

## 2016-06-02 ENCOUNTER — Ambulatory Visit: Payer: 59

## 2016-06-02 ENCOUNTER — Ambulatory Visit: Payer: 59 | Admitting: Pharmacist

## 2016-06-02 DIAGNOSIS — M25672 Stiffness of left ankle, not elsewhere classified: Secondary | ICD-10-CM | POA: Diagnosis not present

## 2016-06-02 DIAGNOSIS — R262 Difficulty in walking, not elsewhere classified: Secondary | ICD-10-CM

## 2016-06-02 DIAGNOSIS — R600 Localized edema: Secondary | ICD-10-CM

## 2016-06-02 DIAGNOSIS — M25572 Pain in left ankle and joints of left foot: Secondary | ICD-10-CM | POA: Diagnosis not present

## 2016-06-02 DIAGNOSIS — R29898 Other symptoms and signs involving the musculoskeletal system: Secondary | ICD-10-CM | POA: Diagnosis not present

## 2016-06-02 NOTE — Therapy (Signed)
[place], [place], [address] Phone: [phone]   Fax:  [phone]  Physical Therapy Treatment  Patient Details  Name: [name] MRN: [id number] Date of Birth: August 13, 1951 Referring Provider: Vickki Hearing, MD  Encounter Date: 06/02/2016      PT End of Session - 06/02/16 1200    Visit Number 5   Number of Visits 12   Date for PT Re-Evaluation 06/23/16   PT Start Time 1100   PT Stop Time 1152   PT Time Calculation (min) 52 min   Activity Tolerance Patient tolerated treatment well   Behavior During Therapy [facility] for tasks assessed/performed      Past Medical History  Diagnosis Date  . Diabetes mellitus     type II  . GERD (gastroesophageal reflux disease)   . Hypertension   . IBS (irritable bowel syndrome)   . Depression   . Anxiety   . LBP (low back pain)   . Osteoarthritis   . Colitis 2010    microscopic- Dr Henrene Pastor  . Renal insufficiency 2011  . Osteopenia   . Anemia   . Pneumonia     hx of 2014   . Headache   . Sinusitis     currently being treated with antibiotic will complete 03/04/2015  . Neuropathy ([place])     feet bilat     Past Surgical History  Procedure Laterality Date  . Foramen magnum ependymoma surgery  2003    Dr Rita Ohara  . Total knee arthroplasty      L 2008, R 2009, R 2016- Dr Maureen Ralphs  . Joint replacement Bilateral   . Tonsillectomy    . Nasal sinus surgery      1973   . Total knee revision Right 03/05/2015    Procedure: RIGHT TOTAL KNEE ARTHROPLASTY REVISION;  Surgeon: Gaynelle Arabian, MD;  [facility];  Service: Orthopedics;  Laterality: Right;    There were no vitals filed for this visit.      Subjective Assessment - 06/02/16 1204    Subjective Sore from weather but doing Ok   Currently in Pain? Yes   Pain Score 2    Pain Location Ankle   Pain Orientation Left   Pain Descriptors / Indicators Aching   Pain Type Chronic pain   Pain Onset More than a  month ago   Pain Frequency Intermittent   Aggravating Factors  weight bearing activity   Pain Relieving Factors get off feet   Multiple Pain Sites No                         [facility] Adult PT Treatment/Exercise - 06/02/16 1206    Ankle Exercises: Aerobic   Stationary Bike L2 6 min    Ankle Exercises: Seated   BAPS Sitting;Level 2  clock and couter clockwise x 30    Other Seated Ankle Exercises Green band x 15 4 way  and issued green band for home. Sit fit for ROM 4 way x 25   Other Seated Ankle Exercises toe YOGA,  lift great toe only,  Lift toes only keeping great toe down. We worked on options to individualize toes with moving toes like playing pianl but still not able to lift great toe without other toes                  PT Short Term Goals - 05/31/16 1131    PT SHORT TERM  GOAL #1   Title She will be independent with inital HEP   Status Achieved   PT SHORT TERM GOAL #2   Title She will report 25% decreased pain with walking   Baseline improving, no % given.   Time 3   Period Weeks   Status On-going           PT Long Term Goals - 05/31/16 1131    PT LONG TERM GOAL #1   Title She wil be able to demo all HEP issued    Time 6   Period Weeks   Status On-going   PT LONG TERM GOAL #2   Title she will report pain LT foot and ankle decreased 50% or more    Time 6   Period Weeks   Status On-going   PT LONG TERM GOAL #3   Title ROM active LT foot and ankle equal RT   Time 6   Period Weeks   Status Unable to assess   PT LONG TERM GOAL #4   Title She will report no need for CAM walker for pain releif   Baseline no longer using(05/31/2016)   Time 6   Period Weeks   Status Achieved               Plan - 06/02/16 1200    Clinical Impression Statement She declined Vaso as she is doing OK today. Sore due to weather but doing well. she was  able to handle green band for exercise so issued for home. Did not see plan from last visit so will plan  on more closed chain exercise next visit   PT Next Visit Plan closed chain try heel lifts.  Patient wants to work some balance work. (She has to sit down to don pants for balance and toes get cought on leg of pants)   Consulted and Agree with Plan of Care Patient      Patient will benefit from skilled therapeutic intervention in order to improve the following deficits and impairments:  Difficulty walking, Pain, Increased edema, Decreased activity tolerance, Decreased range of motion  Visit Diagnosis: Pain in left ankle and joints of left foot  Difficulty in walking, not elsewhere classified  Localized edema  Stiffness of left ankle, not elsewhere classified     Problem List Patient Active Problem List   Diagnosis Date Noted  . Failed total right knee replacement ([place]) 03/05/2015  . OA (osteoarthritis) of knee 03/05/2015  . Rib pain on left side 02/27/2015  . URI, acute 09/17/2013  . Swelling of limb-Bilateral leg Left > than right 07/11/2013  . Numbness-Left foot 07/11/2013  . Chronic venous insufficiency 04/19/2013  . Left hand pain 02/09/2013  . Right shoulder pain 09/12/2012  . Edema 04/24/2012  . Grief 11/10/2011  . ABSCESS, TOOTH 02/03/2011  . OSTEOPENIA 10/28/2010  . HYPERKALEMIA 07/15/2010  . ARTHRALGIA 04/08/2010  . FATIGUE 04/08/2010  . XEROSTOMIA 02/04/2010  . ECZEMA 10/29/2009  . TOBACCO USE, QUIT 10/29/2009  . Anemia 06/18/2009  . 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 Diarrhea 06/18/2009  . OPACITY, VITREOUS HUMOR 01/15/2009  . COLITIS 10/16/2008  . KNEE PAIN 04/10/2008  . HYPERCHOLESTEROLEMIA 01/10/2008  . ANXIETY 11/08/2007  . Depression 11/08/2007  . Osteoarthritis 11/08/2007  . LOW BACK PAIN 11/08/2007  . Diabetes mellitus type 2, controlled ([place]) 10/07/2007  . Essential hypertension 10/07/2007  . GERD 10/07/2007  . MICROALBUMINURIA 10/07/2007    Darrel Hoover  PT 06/02/2016, 12:15 PM  [place], [place], [address] Phone: [phone]   Fax:  [phone]  Name: [name] MRN: [id number] Date of Birth: 1951-12-07

## 2016-06-07 ENCOUNTER — Ambulatory Visit: Payer: 59 | Admitting: Physical Therapy

## 2016-06-07 DIAGNOSIS — M25672 Stiffness of left ankle, not elsewhere classified: Secondary | ICD-10-CM | POA: Diagnosis not present

## 2016-06-07 DIAGNOSIS — R29898 Other symptoms and signs involving the musculoskeletal system: Secondary | ICD-10-CM | POA: Diagnosis not present

## 2016-06-07 DIAGNOSIS — R262 Difficulty in walking, not elsewhere classified: Secondary | ICD-10-CM

## 2016-06-07 DIAGNOSIS — M25572 Pain in left ankle and joints of left foot: Secondary | ICD-10-CM

## 2016-06-07 DIAGNOSIS — R600 Localized edema: Secondary | ICD-10-CM | POA: Diagnosis not present

## 2016-06-07 NOTE — Therapy (Signed)
[place], [place], [address] Phone: [phone]   Fax:  [phone]  Physical Therapy Treatment  Patient Details  Name: [name] MRN: [id number] Date of Birth: 1951-09-30 Referring Provider: Vickki Hearing, MD  Encounter Date: 06/07/2016      PT End of Session - 06/07/16 1319    Visit Number 6   Number of Visits 12   Date for PT Re-Evaluation 06/23/16   PT Start Time 4944   PT Stop Time 1117   PT Time Calculation (min) 48 min   Activity Tolerance Patient tolerated treatment well   Behavior During Therapy [facility] for tasks assessed/performed      Past Medical History  Diagnosis Date  . Diabetes mellitus     type II  . GERD (gastroesophageal reflux disease)   . Hypertension   . IBS (irritable bowel syndrome)   . Depression   . Anxiety   . LBP (low back pain)   . Osteoarthritis   . Colitis 2010    microscopic- Dr Henrene Pastor  . Renal insufficiency 2011  . Osteopenia   . Anemia   . Pneumonia     hx of 2014   . Headache   . Sinusitis     currently being treated with antibiotic will complete 03/04/2015  . Neuropathy ([place])     feet bilat     Past Surgical History  Procedure Laterality Date  . Foramen magnum ependymoma surgery  2003    Dr Rita Ohara  . Total knee arthroplasty      L 2008, R 2009, R 2016- Dr Maureen Ralphs  . Joint replacement Bilateral   . Tonsillectomy    . Nasal sinus surgery      1973   . Total knee revision Right 03/05/2015    Procedure: RIGHT TOTAL KNEE ARTHROPLASTY REVISION;  Surgeon: Gaynelle Arabian, MD;  Location: [facility];  Service: Orthopedics;  Laterality: Right;    There were no vitals filed for this visit.      Subjective Assessment - 06/07/16 1034    Subjective As soon as compression sock os off itr swells again.  Tape helped as long as it was on.  Pain 4-5/10   Currently in Pain? Yes   Pain Score 5    Pain Location Ankle   Pain Orientation Left   Pain Descriptors /  Indicators Aching  Stiffness.    Pain Frequency Intermittent   Aggravating Factors  no compression sock,     Pain Relieving Factors getting off feet,  use of compresion sock.                          [place] [facility] Adult PT Treatment/Exercise - 06/07/16 0001    Knee/Hip Exercises: Standing   Heel Raises --  10 X, both   SLS 1 rep  unable to let go of counter do to fear of falling.   SLS with Vectors toe taps forward and abduction 10 X each,  right/lrft, SBA for safety   Knee/Hip Exercises: Seated   Marching Strengthening;Both;2 sets   Marching Limitations 1 set with hip abduction /adduction  groin pain 4-5/10  eases with rest.   Knee/Hip Exercises: Supine   Bridges 10 reps  HEP   Knee/Hip Exercises: Sidelying   Clams 10 each side   Vasopneumatic   Number Minutes Vasopneumatic  15 minutes   Vasopnuematic Location  Ankle   Vasopneumatic Pressure Low   Vasopneumatic Temperature  32  leg elevated   Ankle Exercises: Seated   Other Seated Ankle Exercises green band 4 way 10  X 1 set.     Ankle Exercises: Standing   Heel Raises 10 reps  both, holding counter                PT Education - 06/07/16 1054    Education provided Yes   Education Details [employer]) Educated Patient   Methods Explanation;Tactile cues;Verbal cues;Handout   Comprehension Verbalized understanding;Returned demonstration          PT Short Term Goals - 06/07/16 1322    PT SHORT TERM GOAL #1   Title She will be independent with inital HEP   Time 2   Period Weeks   Status Achieved   PT SHORT TERM GOAL #2   Title She will report 25% decreased pain with walking   Baseline improving, no % given.   Time 3   Period Weeks   Status On-going           PT Long Term Goals - 05/31/16 1131    PT LONG TERM GOAL #1   Title She wil be able to demo all HEP issued    Time 6   Period Weeks   Status On-going   PT LONG TERM GOAL #2   Title she will report pain LT foot and ankle  decreased 50% or more    Time 6   Period Weeks   Status On-going   PT LONG TERM GOAL #3   Title ROM active LT foot and ankle equal RT   Time 6   Period Weeks   Status Unable to assess   PT LONG TERM GOAL #4   Title She will report no need for CAM walker for pain releif   Baseline no longer using(05/31/2016)   Time 6   Period Weeks   Status Achieved               Plan - 06/07/16 1319    Clinical Impression Statement Patient had some work with balance.  She is unable to single leg stand without holding.  her hips are weak.  Edema increased today.  No new goals met, but able to progress to more closed chain exercises.   PT Next Visit Plan closed chain.   balance work. Core, eccentric calf.   PT Home Exercise Plan bridges, clam.    Consulted and Agree with Plan of Care Patient      Patient will benefit from skilled therapeutic intervention in order to improve the following deficits and impairments:  Difficulty walking, Pain, Increased edema, Decreased activity tolerance, Decreased range of motion  Visit Diagnosis: Pain in left ankle and joints of left foot  Difficulty in walking, not elsewhere classified  Localized edema  Stiffness of left ankle, not elsewhere classified  Weakness of right leg     Problem List Patient Active Problem List   Diagnosis Date Noted  . 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 OA (osteoarthritis) of knee 03/05/2015  . Rib pain on left side 02/27/2015  . URI, acute 09/17/2013  . Swelling of limb-Bilateral leg Left > than right 07/11/2013  . Numbness-Left foot 07/11/2013  . Chronic venous insufficiency 04/19/2013  . Left hand pain 02/09/2013  . Right shoulder pain 09/12/2012  . Edema 04/24/2012  . Grief 11/10/2011  . ABSCESS, TOOTH 02/03/2011  . OSTEOPENIA 10/28/2010  . HYPERKALEMIA 07/15/2010  . ARTHRALGIA 04/08/2010  .  FATIGUE 04/08/2010  . XEROSTOMIA 02/04/2010  . ECZEMA 10/29/2009  . TOBACCO USE, QUIT  10/29/2009  . Anemia 06/18/2009  . 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 Diarrhea 06/18/2009  . OPACITY, VITREOUS HUMOR 01/15/2009  . COLITIS 10/16/2008  . KNEE PAIN 04/10/2008  . HYPERCHOLESTEROLEMIA 01/10/2008  . ANXIETY 11/08/2007  . Depression 11/08/2007  . Osteoarthritis 11/08/2007  . LOW BACK PAIN 11/08/2007  . Diabetes mellitus type 2, controlled ([place]) 10/07/2007  . Essential hypertension 10/07/2007  . GERD 10/07/2007  . MICROALBUMINURIA 10/07/2007    HARRIS,KAREN 06/07/2016, 1:25 PM  [facility] [address] [place], [place], [address] Phone: [phone]   Fax:  [phone]  Name: [name] MRN: [id number] Date of Birth: [birth year]    Melvenia Needles, PTA 06/07/2016 1:25 PM Phone: [phone] Fax: [phone]

## 2016-06-07 NOTE — Patient Instructions (Addendum)
Bridge    Lie back, legs bent. Inhale, pressing hips up. Keeping ribs in, lengthen lower back. Exhale, rolling down along spine from top. Repeat _10 - 20__ times. Do __1__ sessions per day.  http://pm.exer.us/55   Copyright  VHI. All rights reserved.  May do clams while on your side .  Daily 10 to 20 x each.

## 2016-06-08 DIAGNOSIS — D225 Melanocytic nevi of trunk: Secondary | ICD-10-CM | POA: Diagnosis not present

## 2016-06-08 DIAGNOSIS — L821 Other seborrheic keratosis: Secondary | ICD-10-CM | POA: Diagnosis not present

## 2016-06-08 DIAGNOSIS — L570 Actinic keratosis: Secondary | ICD-10-CM | POA: Diagnosis not present

## 2016-06-08 DIAGNOSIS — L814 Other melanin hyperpigmentation: Secondary | ICD-10-CM | POA: Diagnosis not present

## 2016-06-09 ENCOUNTER — Ambulatory Visit: Payer: 59 | Admitting: Physical Therapy

## 2016-06-09 DIAGNOSIS — R29898 Other symptoms and signs involving the musculoskeletal system: Secondary | ICD-10-CM | POA: Diagnosis not present

## 2016-06-09 DIAGNOSIS — M25672 Stiffness of left ankle, not elsewhere classified: Secondary | ICD-10-CM

## 2016-06-09 DIAGNOSIS — R262 Difficulty in walking, not elsewhere classified: Secondary | ICD-10-CM

## 2016-06-09 DIAGNOSIS — R600 Localized edema: Secondary | ICD-10-CM

## 2016-06-09 DIAGNOSIS — M25572 Pain in left ankle and joints of left foot: Secondary | ICD-10-CM

## 2016-06-09 MED FILL — OxyCONTIN 20 MG T12A: 20 | 30 days supply | Qty: 60 | Fill #0

## 2016-06-09 MED FILL — oxyCODONE HCL 15 MG TABS: 15 | 30 days supply | Qty: 120 | Fill #0

## 2016-06-09 NOTE — Addendum Note (Signed)
Addended by: Darrel Hoover on: 06/09/2016 07:08 AM   Modules accepted: Orders

## 2016-06-09 NOTE — Therapy (Signed)
[place], [place], [address] Phone: [phone]   Fax:  [phone]  Physical Therapy Treatment  Patient Details  Name: [name] MRN: [id number] Date of Birth: [birth year] Referring Provider: Vickki Hearing, MD  Encounter Date: 06/09/2016      PT End of Session - 06/09/16 1208    Visit Number 7   Number of Visits 12   Date for PT Re-Evaluation 06/23/16   PT Start Time 1024   PT Stop Time 1120   PT Time Calculation (min) 56 min   Activity Tolerance Patient tolerated treatment well   Behavior During Therapy [facility] for tasks assessed/performed      Past Medical History  Diagnosis Date  . Diabetes mellitus     type II  . GERD (gastroesophageal reflux disease)   . Hypertension   . IBS (irritable bowel syndrome)   . Depression   . Anxiety   . LBP (low back pain)   . Osteoarthritis   . Colitis 2010    microscopic- Dr Henrene Pastor  . Renal insufficiency 2011  . Osteopenia   . Anemia   . Pneumonia     hx of 2014   . Headache   . Sinusitis     currently being treated with antibiotic will complete 03/04/2015  . Neuropathy ([place])     feet bilat     Past Surgical History  Procedure Laterality Date  . Foramen magnum ependymoma surgery  2003    Dr Rita Ohara  . Total knee arthroplasty      L 2008, R 2009, R 2016- Dr Maureen Ralphs  . Joint replacement Bilateral   . Tonsillectomy    . Nasal sinus surgery      1973   . Total knee revision Right 03/05/2015    Procedure: RIGHT TOTAL KNEE ARTHROPLASTY REVISION;  Surgeon: Gaynelle Arabian, MD;  [facility];  Service: Orthopedics;  Laterality: Right;    There were no vitals filed for this visit.      Subjective Assessment - 06/09/16 1033    Subjective I had a phone call and i didn't have tome to put compression scok on.  Pain a little.     Currently in Pain? Yes   Pain Score --  mild   Pain Location Ankle   Pain Orientation Left   Pain Descriptors /  Indicators Aching   Pain Frequency Intermittent   Aggravating Factors  edema,  standing still  on concrete the worse.   Pain Relieving Factors fetting off feet            [facility] PT Assessment - 06/09/16 0001    Figure 8 Edema   Figure 8 - Left  55.5cm  Lateral malleoli :  26.5 cm                     [facility] Adult PT Treatment/Exercise - 06/09/16 1040    Vasopneumatic   Number Minutes Vasopneumatic  15 minutes   Vasopnuematic Location  Ankle   Vasopneumatic Pressure Medium   Vasopneumatic Temperature  32  leg elevated   Manual Therapy   Manual therapy comments 2 fans for edema,  retrograde soft tissue work, Scientist, research (medical)  toes/ foot.  soft tissue work to bottom of heel.  light due to sensitivity,   Ankle Exercises: Seated   Towel Crunch --  10 X   Heel Raises 10 reps   Ankle Exercises: Supine   Other Supine Ankle Exercises Manual 3  way resistance 10 X 2 sets.  Eversion difficult initiall lacked Very much ROM,  better with cues,  Resistance not tolerated EV  IV/ DF tolerate moderate resistance   Ankle Exercises: Stretches   Plantar Fascia Stretch --  2 minutes rolling on ball.  plantar    Other Stretch toe stretch with heel lift.  , cues to keep toes flat vs curled under.                   PT Short Term Goals - 06/07/16 1322    PT SHORT TERM GOAL #1   Title She will be independent with inital HEP   Time 2   Period Weeks   Status Achieved   PT SHORT TERM GOAL #2   Title She will report 25% decreased pain with walking   Baseline improving, no % given.   Time 3   Period Weeks   Status On-going           PT Long Term Goals - 05/31/16 1131    PT LONG TERM GOAL #1   Title She wil be able to demo all HEP issued    Time 6   Period Weeks   Status On-going   PT LONG TERM GOAL #2   Title she will report pain LT foot and ankle decreased 50% or more    Time 6   Period Weeks   Status On-going   PT LONG TERM GOAL #3   Title ROM active LT foot and  ankle equal RT   Time 6   Period Weeks   Status Unable to assess   PT LONG TERM GOAL #4   Title She will report no need for CAM walker for pain releif   Baseline no longer using(05/31/2016)   Time 6   Period Weeks   Status Achieved               Plan - 06/09/16 1209    Clinical Impression Statement Patient continues to have pain with long standing.  pain has improved to average pain 2-3/10.  Edema improving with manual     PT Next Visit Plan ANKLE ROM/ strengtheningfocus .  Patient thinks she hass 1 more visit. Then D/c   PT Home Exercise Plan continue   Consulted and Agree with Plan of Care Patient      Patient will benefit from skilled therapeutic intervention in order to improve the following deficits and impairments:     Visit Diagnosis: Pain in left ankle and joints of left foot  Difficulty in walking, not elsewhere classified  Localized edema  Stiffness of left ankle, not elsewhere classified  Weakness of right leg     Problem List Patient Active Problem List   Diagnosis Date Noted  . Failed total right knee replacement ([place]) 03/05/2015  . OA (osteoarthritis) of knee 03/05/2015  . Rib pain on left side 02/27/2015  . URI, acute 09/17/2013  . Swelling of limb-Bilateral leg Left > than right 07/11/2013  . Numbness-Left foot 07/11/2013  . Chronic venous insufficiency 04/19/2013  . Left hand pain 02/09/2013  . Right shoulder pain 09/12/2012  . Edema 04/24/2012  . Grief 11/10/2011  . ABSCESS, TOOTH 02/03/2011  . OSTEOPENIA 10/28/2010  . HYPERKALEMIA 07/15/2010  . ARTHRALGIA 04/08/2010  . FATIGUE 04/08/2010  . XEROSTOMIA 02/04/2010  . ECZEMA 10/29/2009  . TOBACCO USE, QUIT 10/29/2009  . Anemia 06/18/2009  . 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 Diarrhea 06/18/2009  .  OPACITY, VITREOUS HUMOR 01/15/2009  . COLITIS 10/16/2008  . KNEE PAIN 04/10/2008  . HYPERCHOLESTEROLEMIA 01/10/2008  . ANXIETY 11/08/2007  . Depression  11/08/2007  . Osteoarthritis 11/08/2007  . LOW BACK PAIN 11/08/2007  . Diabetes mellitus type 2, controlled ([place]) 10/07/2007  . Essential hypertension 10/07/2007  . GERD 10/07/2007  . MICROALBUMINURIA 10/07/2007    HARRIS,KAREN 06/09/2016, 12:18 PM  [facility] [address] [place], [place], [address] Phone: [phone]   Fax:  [phone]  Name: [name] MRN: [id number] Date of Birth: 04/22/1951    Melvenia Needles, PTA 06/09/2016 12:18 PM Phone: [phone] Fax: [phone]

## 2016-06-14 MED FILL — BUPROPION HCL XL 150 MG TAB: 150 | 90 days supply | Qty: 180 | Fill #3

## 2016-06-18 ENCOUNTER — Ambulatory Visit: Payer: 59 | Attending: Sports Medicine

## 2016-06-18 DIAGNOSIS — M25672 Stiffness of left ankle, not elsewhere classified: Secondary | ICD-10-CM | POA: Insufficient documentation

## 2016-06-18 DIAGNOSIS — R600 Localized edema: Secondary | ICD-10-CM | POA: Diagnosis not present

## 2016-06-18 DIAGNOSIS — R262 Difficulty in walking, not elsewhere classified: Secondary | ICD-10-CM | POA: Diagnosis not present

## 2016-06-18 DIAGNOSIS — M25472 Effusion, left ankle: Secondary | ICD-10-CM | POA: Diagnosis not present

## 2016-06-18 DIAGNOSIS — M25572 Pain in left ankle and joints of left foot: Secondary | ICD-10-CM | POA: Diagnosis not present

## 2016-06-18 DIAGNOSIS — M84375D Stress fracture, left foot, subsequent encounter for fracture with routine healing: Secondary | ICD-10-CM | POA: Diagnosis not present

## 2016-06-18 MED FILL — LIDOCAINE 5% OINTMENT: 5 | 20 days supply | Qty: 30 | Fill #0

## 2016-06-18 NOTE — Therapy (Addendum)
[place], [place], [address] Phone: [phone]   Fax:  [phone]  Physical Therapy Treatment/Discharge  Patient Details  Name: [name] MRN: [id number] Date of Birth: [birth year] Referring Provider: Vickki Hearing, MD  Encounter Date: 06/18/2016      PT End of Session - 06/18/16 0920    Visit Number 8   Number of Visits 12   Date for PT Re-Evaluation 06/23/16   PT Start Time 0848   PT Stop Time 0920   PT Time Calculation (min) 32 min   Activity Tolerance Patient tolerated treatment well   Behavior During Therapy [facility] for tasks assessed/performed      Past Medical History  Diagnosis Date  . Diabetes mellitus     type II  . GERD (gastroesophageal reflux disease)   . Hypertension   . IBS (irritable bowel syndrome)   . Depression   . Anxiety   . LBP (low back pain)   . Osteoarthritis   . Colitis 2010    microscopic- Dr Henrene Pastor  . Renal insufficiency 2011  . Osteopenia   . Anemia   . Pneumonia     hx of 2014   . Headache   . Sinusitis     currently being treated with antibiotic will complete 03/04/2015  . Neuropathy ([place])     feet bilat     Past Surgical History  Procedure Laterality Date  . Foramen magnum ependymoma surgery  2003    Dr Rita Ohara  . Total knee arthroplasty      L 2008, R 2009, R 2016- Dr Maureen Ralphs  . Joint replacement Bilateral   . Tonsillectomy    . Nasal sinus surgery      1973   . Total knee revision Right 03/05/2015    Procedure: RIGHT TOTAL KNEE ARTHROPLASTY REVISION;  Surgeon: Gaynelle Arabian, MD;  [facility];  Service: Orthopedics;  Laterality: Right;    There were no vitals filed for this visit.      Subjective Assessment - 06/18/16 0851    Subjective No pain. Main problem is swelling. End of day pain lateral malleolus.    Currently in Pain? No/denies   Multiple Pain Sites No            [facility] PT Assessment - 06/18/16 0852    Figure 8 Edema   Figure 8 - Left  55.5cm   AROM   Left Ankle Dorsiflexion 100   Left Ankle Plantar Flexion 50   Left Ankle Inversion 45   Left Ankle Eversion 25  mild arch and metatarsal pain with inversion and eversion   Strength   Overall Strength Comments WNL RT and LT except PF done in standing today  RT  4/5   LT 4-/5(she used more UE suport today with LT PF in standning. She also had mild medial arch pain with Inversion                     [facility] Adult PT Treatment/Exercise - 06/18/16 0001    Knee/Hip Exercises: Standing   Heel Raises Both;5 reps;Right;Left;1 set   Heel Raises Limitations Then  RT and LT x 5      Basic review of HEP but most time spent with discussion of progress and prognosis and how to manage pain/ swelling and strengthening           PT Education - 06/18/16 0919    Education provided Yes   Education Details  discussed possibel reasons for continued pain and swelling but asked her to discuss with MD   Person(s) Educated Patient   Methods Explanation   Comprehension Verbalized understanding          PT Short Term Goals - 06/18/16 0906    PT SHORT TERM GOAL #1   Title She will be independent with inital HEP   Status Achieved   PT SHORT TERM GOAL #2   Title She will report 25% decreased pain with walking   Status Achieved           PT Long Term Goals - 06/18/16 0909    PT LONG TERM GOAL #1   Title She wil be able to demo all HEP issued    Status Achieved   PT LONG TERM GOAL #2   Title she will report pain LT foot and ankle decreased 50% or more    Status Achieved   PT LONG TERM GOAL #4   Title She will report no need for CAM walker for pain releif   Status Achieved   PT LONG TERM GOAL #5   Title she will be able to verbalize and demonstrate techniques to reduce risk of R knee reinjury by postural awareness, proper lifting/carrying and gait mechanics, and HEP (05/05/2015)   Status Achieved               Plan - 06/18/16 0921     Clinical Impression Statement She is much improved and LTG met. She should continue to improve as time goes on . She has a HEP she feel is  sufficient and eleives she will get stronger if she continues. She agrees to discharge but we will wait for MD visit today for his input   PT Next Visit Plan Hold until MD appointment and discharge if  she does not return   Consulted and Agree with Plan of Care Patient      Patient will benefit from skilled therapeutic intervention in order to improve the following deficits and impairments:  Difficulty walking, Pain, Increased edema, Decreased activity tolerance, Decreased range of motion  Visit Diagnosis: Pain in left ankle and joints of left foot  Difficulty in walking, not elsewhere classified  Localized edema  Stiffness of left ankle, not elsewhere classified     Problem List Patient Active Problem List   Diagnosis Date Noted  . 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 OA (osteoarthritis) of knee 03/05/2015  . Rib pain on left side 02/27/2015  . URI, acute 09/17/2013  . Swelling of limb-Bilateral leg Left > than right 07/11/2013  . Numbness-Left foot 07/11/2013  . Chronic venous insufficiency 04/19/2013  . Left hand pain 02/09/2013  . Right shoulder pain 09/12/2012  . Edema 04/24/2012  . Grief 11/10/2011  . ABSCESS, TOOTH 02/03/2011  . OSTEOPENIA 10/28/2010  . HYPERKALEMIA 07/15/2010  . ARTHRALGIA 04/08/2010  . FATIGUE 04/08/2010  . XEROSTOMIA 02/04/2010  . ECZEMA 10/29/2009  . TOBACCO USE, QUIT 10/29/2009  . Anemia 06/18/2009  . 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 Diarrhea 06/18/2009  . OPACITY, VITREOUS HUMOR 01/15/2009  . COLITIS 10/16/2008  . KNEE PAIN 04/10/2008  . HYPERCHOLESTEROLEMIA 01/10/2008  . ANXIETY 11/08/2007  . Depression 11/08/2007  . Osteoarthritis 11/08/2007  . LOW BACK PAIN 11/08/2007  . Diabetes mellitus type 2, controlled ([place]) 10/07/2007  . Essential  hypertension 10/07/2007  . GERD 10/07/2007  . MICROALBUMINURIA 10/07/2007    Darrel Hoover  PT 06/18/2016, 9:24 AM  [place], [place], [address] Phone: [phone]   Fax:  [phone]  Name: [name] MRN: [id number] Date of Birth: Oct 04, 1951   PHYSICAL THERAPY DISCHARGE SUMMARY  Visits from Start of Care: 8 Current functional level related to goals / functional outcomes:See above    Remaining deficits: see above   Education / Equipment: HEP Plan: Patient agrees to discharge.  Patient goals were met. Patient is being discharged due to meeting the stated rehab goals.  ?????     Lillette Boxer Chasse  PT  08/10/16   2:56 PM

## 2016-06-21 ENCOUNTER — Other Ambulatory Visit: Payer: Self-pay | Admitting: Internal Medicine

## 2016-06-21 MED FILL — CEVIMELINE HCL 30 MG CAP: 30 | 90 days supply | Qty: 270 | Fill #0

## 2016-07-06 ENCOUNTER — Other Ambulatory Visit: Payer: Self-pay | Admitting: Internal Medicine

## 2016-07-06 ENCOUNTER — Other Ambulatory Visit: Payer: Self-pay | Admitting: Pharmacist

## 2016-07-06 VITALS — BP 128/90 | Wt 250.0 lb

## 2016-07-06 DIAGNOSIS — E118 Type 2 diabetes mellitus with unspecified complications: Secondary | ICD-10-CM

## 2016-07-06 MED FILL — OXYBUTYNIN 5 MG TABLET: 5 | 90 days supply | Qty: 45 | Fill #3

## 2016-07-06 MED FILL — CARVEDILOL 25 MG TABLET: 25 | 90 days supply | Qty: 180 | Fill #1

## 2016-07-06 MED FILL — metFORMIN HCL 1000 MG TABS: 1000 | 90 days supply | Qty: 180 | Fill #2

## 2016-07-06 MED FILL — CYCLOBENZAPRINE 10 MG TAB: 10 | 90 days supply | Qty: 270 | Fill #1

## 2016-07-06 MED FILL — MEDROXYPROGESTERONE 2.5 MG: 2.5 | 90 days supply | Qty: 90 | Fill #2

## 2016-07-06 MED FILL — LIDOCAINE 5% PATCH: 5 | 30 days supply | Qty: 60 | Fill #1

## 2016-07-06 MED FILL — LORazepam 2 MG TABS: 2 | 30 days supply | Qty: 60 | Fill #3

## 2016-07-06 MED FILL — ESTRADIOL 0.5 MG TABLET: 0.5 | 90 days supply | Qty: 90 | Fill #2

## 2016-07-06 MED FILL — DULoxetine HCL 60 MG CPEP: 60 | 90 days supply | Qty: 90 | Fill #1

## 2016-07-06 MED FILL — PANTOPRAZOLE SOD DR 40 MG T: 40 | 90 days supply | Qty: 90 | Fill #2

## 2016-07-06 NOTE — Patient Outreach (Signed)
[facility]) Care Management  07/06/2016  [name] [birth year] [id number]   Subjective:  Patient is a 65 yo female with type 2 diabetes who presents today for 3 month follow-up as part of the employer-sponsored Link to Wellness program. Current diabetes regimen includes Metformin, glimepiride, and pioglitazone. Patient does not take daily ASA, ACEi, or statin.  Most recent MD follow-up was May with [name].  No med changes at this time.  Of note, pts heel fracture has healed, but ligament issues remain for which she is considering physical therapy.  Of note, patient has adjusted her GERD regimen.  She was getting frequent, nearly daily, headaches, which resolved after discontinuing omeprazole and returned once resuming omeprazole.  Instead she is not taking OTC famotidine twice daily and omeprazole only as needed.  Headaches have resolved.  She is also now taking OTC ferrous sulfate once daily.      Diabetes Assessment:  No changes to diabetes regimen at this time. Patient does maintain good medication compliance. Most recent A1c was unchanged at 7.0% which is at goal of less than 7%.  Weight has remained unchanged since last visit.  Patient did not bring meter today and is not currently testing. No hypoglycemia reported. Patient does demonstrate appropriate correction of hypoglycemia when needed.  Patient reports continued signs and symptoms of neuropathy including numbness/tingling/burning.  She is treating with lidocaine ointment and this is providing some relief.  No symptoms of infection.  Patient is up to date on eye and dental exams.    Lifestyle Assessment:  Diet - no major changes, pt reports portions remain under good control.  Dietary choices could use improvement as patient continues to opt for quick, frozen meals or snack type items for meals.  She attempts to make healthy choices, but admits diet continues to be inconsistent and erratic with regard to  food choices and meal scheduling.  She does not wish to set any goals at this time.  I have encouraged her to resume testing and to determine if there are certain foods she is eating that correlate to foods she is eating.    Exercise - No routine exercise at this time due to heal injury and neuropathy. She plans to do much walking on her upcoming cruise/trip to [place].  She will be using her foot/heel splint if needed.  No goals at this time other than to exercise as neuropathy and foot healing allows.     Plan and Goals: 1)  Exercise as tolerated depending on healing of heel 2)  Attempt to increase testing to once daily, attempt to make correlation between problem foods and hyperglycemic readings 3)  Patient is planning to switch insurance plans soon which will disqualify her from continuing in this program.  We will not schedule a follow-up at this time for this reason.    It has been a pleasure working with this patient.    Tilman Neat, PharmD [facility]  [phone]

## 2016-07-07 MED FILL — FUROSEMIDE 40 MG TABLET: 40 | 90 days supply | Qty: 90 | Fill #0

## 2016-07-08 MED FILL — oxyCODONE HCL 15 MG TABS: 15 | 30 days supply | Qty: 120 | Fill #0

## 2016-07-08 MED FILL — GLIMEPIRIDE 1 MG TABLET: 1 | 90 days supply | Qty: 90 | Fill #0

## 2016-07-08 MED FILL — OxyCONTIN 20 MG T12A: 20 | 30 days supply | Qty: 60 | Fill #0

## 2016-07-30 ENCOUNTER — Ambulatory Visit: Payer: 59 | Admitting: Internal Medicine

## 2016-08-20 ENCOUNTER — Ambulatory Visit (INDEPENDENT_AMBULATORY_CARE_PROVIDER_SITE_OTHER): Payer: BLUE CROSS/BLUE SHIELD | Admitting: Internal Medicine

## 2016-08-20 ENCOUNTER — Other Ambulatory Visit (INDEPENDENT_AMBULATORY_CARE_PROVIDER_SITE_OTHER): Payer: BLUE CROSS/BLUE SHIELD

## 2016-08-20 ENCOUNTER — Encounter: Payer: Self-pay | Admitting: Internal Medicine

## 2016-08-20 VITALS — BP 120/64 | HR 77 | Temp 98.4°F | Wt 251.0 lb

## 2016-08-20 DIAGNOSIS — F329 Major depressive disorder, single episode, unspecified: Secondary | ICD-10-CM

## 2016-08-20 DIAGNOSIS — E119 Type 2 diabetes mellitus without complications: Secondary | ICD-10-CM

## 2016-08-20 DIAGNOSIS — F411 Generalized anxiety disorder: Secondary | ICD-10-CM

## 2016-08-20 DIAGNOSIS — M170 Bilateral primary osteoarthritis of knee: Secondary | ICD-10-CM | POA: Diagnosis not present

## 2016-08-20 DIAGNOSIS — F32A Depression, unspecified: Secondary | ICD-10-CM

## 2016-08-20 DIAGNOSIS — M5440 Lumbago with sciatica, unspecified side: Secondary | ICD-10-CM

## 2016-08-20 DIAGNOSIS — G8929 Other chronic pain: Secondary | ICD-10-CM

## 2016-08-20 DIAGNOSIS — I10 Essential (primary) hypertension: Secondary | ICD-10-CM

## 2016-08-20 DIAGNOSIS — Z23 Encounter for immunization: Secondary | ICD-10-CM | POA: Diagnosis not present

## 2016-08-20 LAB — BASIC METABOLIC PANEL
BUN: 43 mg/dL — ABNORMAL HIGH (ref 6–23)
CO2: 31 mEq/L (ref 19–32)
Calcium: 9.2 mg/dL (ref 8.4–10.5)
Chloride: 95 mEq/L — ABNORMAL LOW (ref 96–112)
Creatinine, Ser: 1.95 mg/dL — ABNORMAL HIGH (ref 0.40–1.20)
GFR: 27.41 mL/min — ABNORMAL LOW (ref >60.00)
Glucose, Bld: 74 mg/dL (ref 70–99)
Potassium: 4.3 mEq/L (ref 3.5–5.1)
Sodium: 132 mEq/L — ABNORMAL LOW (ref 135–145)

## 2016-08-20 LAB — HEPATIC FUNCTION PANEL
ALT: 10 U/L (ref 0–35)
AST: 12 U/L (ref 0–37)
Albumin: 4.1 g/dL (ref 3.5–5.2)
Alkaline Phosphatase: 63 U/L (ref 39–117)
Bilirubin, Direct: 0.1 mg/dL (ref 0.0–0.3)
Total Bilirubin: 0.3 mg/dL (ref 0.2–1.2)
Total Protein: 7.4 g/dL (ref 6.0–8.3)

## 2016-08-20 LAB — HEMOGLOBIN A1C: Hgb A1c MFr Bld: 6.7 % — ABNORMAL HIGH (ref 4.6–6.5)

## 2016-08-20 MED ORDER — OXYCODONE HCL ER 20 MG PO T12A: 20.0000 mg | EXTENDED_RELEASE_TABLET | Freq: Two times a day (BID) | ORAL | 0 refills | Status: DC

## 2016-08-20 MED ORDER — OXYCODONE HCL 15 MG PO TABS: 15.0000 mg | ORAL_TABLET | Freq: Four times a day (QID) | ORAL | 0 refills | Status: DC | PRN

## 2016-08-20 MED ORDER — LORAZEPAM 2 MG PO TABS: 1.0000 mg | ORAL_TABLET | Freq: Four times a day (QID) | ORAL | 3 refills | Status: DC | PRN

## 2016-08-20 MED ORDER — TRIAMCINOLONE ACETONIDE 0.5 % EX OINT: 1.0000 "application " | TOPICAL_OINTMENT | Freq: Two times a day (BID) | CUTANEOUS | 0 refills | Status: DC

## 2016-08-20 MED FILL — LORazepam 2 MG TABS: 2 | 30 days supply | Qty: 60 | Fill #0

## 2016-08-20 MED FILL — TRIAMCINOLONE 0.5% OINTMENT: 0.5 | 14 days supply | Qty: 30 | Fill #0

## 2016-08-20 MED FILL — oxyCODONE HCL 15 MG TABS: 15 | 30 days supply | Qty: 120 | Fill #0

## 2016-08-20 MED FILL — OxyCONTIN 20 MG T12A: 20 | 30 days supply | Qty: 60 | Fill #0

## 2016-08-20 NOTE — Assessment & Plan Note (Signed)
On Wellbutrin

## 2016-08-20 NOTE — Assessment & Plan Note (Signed)
Chronic opioid use. Oxycontin and Roxycodone.  Potential benefits of a long term opioids use as well as potential risks (i.e. addiction risk, apnea etc) and complications (i.e. Somnolence, constipation and others) were explained to the patient and were aknowledged.

## 2016-08-20 NOTE — Progress Notes (Signed)
Pre visit review using our clinic review tool, if applicable. No additional management support is needed unless otherwise documented below in the visit note. 

## 2016-08-20 NOTE — Progress Notes (Signed)
Subjective:  Patient ID: [name], female    DOB: [birth year]  Age: 65 y.o. MRN: [id number]  CC: No chief complaint on file.   HPI [name] presents for LBP, knee OA, depression, DM f/u  Outpatient Medications Prior to Visit  Medication Sig Dispense Refill  . amoxicillin (AMOXIL) 500 MG capsule Take 4 by mouth one hour prior to dental procedure/cleaning. 16 capsule 1  . buPROPion (WELLBUTRIN XL) 150 MG 24 hr tablet TAKE 1 TABLET BY MOUTH TWICE DAILY 180 tablet 3  . carvedilol (COREG) 25 MG tablet Take 1 tablet (25 mg total) by mouth 2 (two) times daily with a meal. 180 tablet 3  . cevimeline (EVOXAC) 30 MG capsule TAKE 2 CAPSULES BY MOUTH IN THE MORNING AND 1 CAPSULE IN THE EVENING 270 capsule 3  . cyclobenzaprine (FLEXERIL) 10 MG tablet Take 3 tablets (30 mg total) by mouth at bedtime. 270 tablet 3  . DULoxetine (CYMBALTA) 60 MG capsule Take 1 capsule (60 mg total) by mouth at bedtime. 90 capsule 3  . estradiol (ESTRACE) 0.5 MG tablet Take 0.5 mg by mouth daily.    . furosemide (LASIX) 40 MG tablet TAKE 1/2 TO 1 TABLET BY MOUTH ONCE DAILY AS NEEDED FOR SWELLING 90 tablet 3  . glimepiride (AMARYL) 1 MG tablet TAKE 1 TABLET BY MOUTH DAILY BEFORE BREAKFAST. 90 tablet 3  . glucose blood (ONE TOUCH TEST STRIPS) test strip Use as instructed 50 each 3  . ibuprofen (ADVIL,MOTRIN) 200 MG tablet Take 800 mg by mouth 2 (two) times daily as needed (For pain.).    Marland Kitchen Lancets (ONETOUCH ULTRASOFT) lancets Use as instructed 100 each 3  . lidocaine (LIDODERM) 5 % PLACE 1-2 PATCHES ONTO THE SKIN AS DIRECTED 60 patch 4  . LORazepam (ATIVAN) 2 MG tablet Take 0.5 tablets (1 mg total) by mouth every 6 (six) hours as needed for anxiety. 60 tablet 3  . LUTEIN PO Take 1 tablet by mouth at bedtime.    . meclizine (ANTIVERT) 12.5 MG tablet Take 1 tablet (12.5 mg total) by mouth 3 (three) times daily as needed for dizziness. 60 tablet 1  . medroxyPROGESTERone (PROVERA) 2.5 MG tablet Take 2.5 mg by mouth  daily.    . metFORMIN (GLUCOPHAGE) 1000 MG tablet Take 1 tablet (1,000 mg total) by mouth 2 (two) times daily with a meal. 180 tablet 3  . Multiple Vitamin (MULTIVITAMIN WITH MINERALS) TABS tablet Take 1 tablet by mouth daily.    . Omega-3 300 MG CAPS Take 300 mg by mouth at bedtime.    Marland Kitchen oxybutynin (DITROPAN) 5 MG tablet TAKE 1/2 TABLET BY MOUTH DAILY 45 tablet 3  . oxyCODONE (OXYCONTIN) 20 mg 12 hr tablet Take 1 tablet (20 mg total) by mouth every 12 (twelve) hours. Please fill on or after 06/29/16 60 tablet 0  . oxyCODONE (ROXICODONE) 15 MG immediate release tablet Take 1 tablet (15 mg total) by mouth every 6 (six) hours as needed. Please fill on or after 06/29/16 120 tablet 0  . pantoprazole (PROTONIX) 40 MG tablet Take 1 tablet (40 mg total) by mouth daily. 90 tablet 3  . pioglitazone (ACTOS) 15 MG tablet TAKE 1 TABLET BY MOUTH ONCE DAILY 90 tablet 3  . PSYLLIUM PO Take 1 capsule by mouth 2 (two) times daily.    . Vitamin D, Cholecalciferol, 1000 UNITS TABS Take 1,000 Units by mouth daily.    . colchicine 0.6 MG tablet Take 1 tablet (0.6 mg total) by  mouth daily. (Patient not taking: Reported on 08/20/2016) 30 tablet 3  . famotidine (PEPCID) 10 MG tablet Take 10 mg by mouth 2 (two) times daily. Reported on 05/12/2016    . ferrous sulfate 325 (65 FE) MG tablet Take 325 mg by mouth daily with breakfast. Reported on 05/12/2016    . omeprazole (PRILOSEC) 20 MG capsule Take 1 capsule (20 mg total) by mouth daily. (Patient not taking: Reported on 08/20/2016) 90 capsule 3  . polyvinyl alcohol (LIQUIFILM TEARS) 1.4 % ophthalmic solution Place 1 drop into both eyes daily as needed for dry eyes.     No facility-administered medications prior to visit.     ROS Review of Systems  Constitutional: Positive for fatigue. Negative for activity change, appetite change, chills and unexpected weight change.  HENT: Negative for congestion, mouth sores and sinus pressure.   Eyes: Negative for visual disturbance.    Respiratory: Negative for cough and chest tightness.   Gastrointestinal: Negative for abdominal pain and nausea.  Genitourinary: Negative for difficulty urinating, frequency and vaginal pain.  Musculoskeletal: Positive for arthralgias, back pain and gait problem.  Skin: Negative for pallor and rash.  Neurological: Negative for dizziness, tremors, weakness, numbness and headaches.  Psychiatric/Behavioral: Positive for dysphoric mood. Negative for confusion, sleep disturbance and suicidal ideas. The patient is nervous/anxious.     Objective:  BP 120/64   Pulse 77   Temp 98.4 F (36.9 C) (Oral)   Wt 251 lb (113.9 kg)   SpO2 96%   BMI 44.46 kg/m   BP Readings from Last 3 Encounters:  08/20/16 120/64  07/06/16 128/90  04/28/16 (!) 150/84    Wt Readings from Last 3 Encounters:  08/20/16 251 lb (113.9 kg)  07/06/16 250 lb (113.4 kg)  03/01/16 265 lb (120.2 kg)    Physical Exam  Constitutional: She appears well-developed. No distress.  HENT:  Head: Normocephalic.  Right Ear: External ear normal.  Left Ear: External ear normal.  Nose: Nose normal.  Mouth/Throat: Oropharynx is clear and moist.  Eyes: Conjunctivae are normal. Pupils are equal, round, and reactive to light. Right eye exhibits no discharge. Left eye exhibits no discharge.  Neck: Normal range of motion. Neck supple. No JVD present. No tracheal deviation present. No thyromegaly present.  Cardiovascular: Normal rate, regular rhythm and normal heart sounds.   Pulmonary/Chest: No stridor. No respiratory distress. She has no wheezes.  Abdominal: Soft. Bowel sounds are normal. She exhibits no distension and no mass. There is no tenderness. There is no rebound and no guarding.  Musculoskeletal: She exhibits tenderness. She exhibits no edema.  Lymphadenopathy:    She has no cervical adenopathy.  Neurological: She displays normal reflexes. No cranial nerve deficit. She exhibits normal muscle tone. Coordination normal.   Skin: No rash noted. No erythema.  Psychiatric: She has a normal mood and affect. Her behavior is normal. Judgment and thought content normal.  Obese  Lab Results  Component Value Date   WBC 10.7 (H) 04/28/2016   HGB 10.4 (L) 04/28/2016   HCT 31.6 (L) 04/28/2016   PLT 251.0 04/28/2016   GLUCOSE 99 04/28/2016   CHOL 215 (H) 09/25/2014   TRIG 154.0 (H) 09/25/2014   HDL 46.00 09/25/2014   LDLCALC 138 (H) 09/25/2014   ALT 10 04/28/2016   AST 13 04/28/2016   NA 132 (L) 04/28/2016   K 4.9 04/28/2016   CL 97 04/28/2016   CREATININE 2.01 (H) 04/28/2016   BUN 40 (H) 04/28/2016   CO2 28 04/28/2016  TSH 1.64 07/25/2013   INR 1.04 02/26/2015   HGBA1C 7.0 (H) 04/28/2016    Dg Chest 2 View  Result Date: 03/01/2016 CLINICAL DATA:  65 year old female with shortness of breath and productive cough for several weeks. EXAM: CHEST  2 VIEW COMPARISON:  Chest x-ray 12/01/2013. FINDINGS: Lung volumes are normal. No consolidative airspace disease. No pleural effusions. No pneumothorax. No pulmonary nodule or mass noted. Pulmonary vasculature and the cardiomediastinal silhouette are within normal limits. IMPRESSION: No radiographic evidence of acute cardiopulmonary disease. Electronically Signed   By: Vinnie Langton M.D.   On: 03/01/2016 17:40    Assessment & Plan:   There are no diagnoses linked to this encounter. I am having Ms. Dirks maintain her onetouch ultrasoft, glucose blood, polyvinyl alcohol, Vitamin D (Cholecalciferol), ibuprofen, LUTEIN PO, multivitamin with minerals, Omega-3, PSYLLIUM PO, omeprazole, metFORMIN, estradiol, medroxyPROGESTERone, oxybutynin, famotidine, ferrous sulfate, LORazepam, amoxicillin, meclizine, pantoprazole, pioglitazone, carvedilol, cyclobenzaprine, colchicine, oxyCODONE, oxyCODONE, lidocaine, DULoxetine, cevimeline, buPROPion, glimepiride, furosemide, and lidocaine.  Meds ordered this encounter  Medications  . lidocaine (XYLOCAINE) 5 % ointment    Sig: 2  (two) times daily.    Refill:  0     Follow-up: No Follow-up on file.  [name], MD

## 2016-08-20 NOTE — Assessment & Plan Note (Signed)
On Coreg 

## 2016-08-20 NOTE — Assessment & Plan Note (Signed)
Oxycod and Oxycont  Potential benefits of a long term opioids use as well as potential risks (i.e. addiction risk, apnea etc) and complications (i.e. Somnolence, constipation and others) were explained to the patient and were aknowledged.

## 2016-08-20 NOTE — Assessment & Plan Note (Addendum)
Grieving her friend's death  Lorazepam prn  Potential benefits of a long term benzodiazepines  use as well as potential risks  and complications were explained to the patient and were aknowledged.

## 2016-08-20 NOTE — Addendum Note (Signed)
Addended by: Cresenciano Lick on: 08/20/2016 04:37 PM   Modules accepted: Orders

## 2016-08-20 NOTE — Assessment & Plan Note (Signed)
Glimeperide, Metformin, Actos

## 2016-09-02 MED FILL — PIOGLITAZONE HCL 15 MG TAB: 15 | 90 days supply | Qty: 90 | Fill #2

## 2016-09-03 ENCOUNTER — Encounter: Payer: Self-pay | Admitting: Internal Medicine

## 2016-09-09 MED FILL — BUPROPION HCL XL 150 MG TAB: 150 | 90 days supply | Qty: 180 | Fill #0

## 2016-09-16 MED FILL — CEVIMELINE HCL 30 MG CAP: 30 | 90 days supply | Qty: 270 | Fill #1

## 2016-09-21 MED FILL — OxyCONTIN 20 MG T12A: 20 | 30 days supply | Qty: 60 | Fill #0

## 2016-09-21 MED FILL — oxyCODONE HCL 15 MG TABS: 15 | 30 days supply | Qty: 120 | Fill #0

## 2016-09-29 MED FILL — KETOCONAZOLE 2% CREAM: 2 | 20 days supply | Qty: 30 | Fill #0

## 2016-10-05 MED FILL — CARVEDILOL 25 MG TABLET: 25 | 90 days supply | Qty: 180 | Fill #2

## 2016-10-05 MED FILL — FUROSEMIDE 40 MG TABLET: 40 | 30 days supply | Qty: 30 | Fill #1

## 2016-10-13 MED FILL — CLINDAMYCIN PH 1% GEL: 1 | 30 days supply | Qty: 60 | Fill #0

## 2016-10-14 MED FILL — LORazepam 2 MG TABS: 2 | 30 days supply | Qty: 60 | Fill #1

## 2016-10-18 ENCOUNTER — Other Ambulatory Visit: Payer: Self-pay | Admitting: Internal Medicine

## 2016-10-18 MED ORDER — OXYBUTYNIN CHLORIDE 5 MG PO TABS: 2.5000 mg | ORAL_TABLET | Freq: Every day | ORAL | 1 refills | Status: DC

## 2016-10-18 MED FILL — OXYBUTYNIN 5 MG TABLET: 5 | 90 days supply | Qty: 45 | Fill #0

## 2016-10-18 MED FILL — ESTRADIOL 0.5 MG TABLET: 0.5 | 90 days supply | Qty: 90 | Fill #3

## 2016-10-18 MED FILL — MEDROXYPROGESTERONE 2.5 MG: 2.5 | 90 days supply | Qty: 90 | Fill #3

## 2016-10-18 MED FILL — CYCLOBENZAPRINE 10 MG TAB: 10 | 90 days supply | Qty: 270 | Fill #2

## 2016-10-18 MED FILL — metFORMIN HCL 1000 MG TABS: 1000 | 90 days supply | Qty: 180 | Fill #0

## 2016-10-22 MED FILL — OxyCONTIN 20 MG T12A: 20 | 30 days supply | Qty: 60 | Fill #0

## 2016-10-22 MED FILL — oxyCODONE HCL 15 MG TABS: 15 | 30 days supply | Qty: 120 | Fill #0

## 2016-11-01 MED FILL — DULoxetine HCL 60 MG CPEP: 60 | 90 days supply | Qty: 90 | Fill #2

## 2016-11-01 MED FILL — PANTOPRAZOLE SOD DR 40 MG T: 40 | 90 days supply | Qty: 90 | Fill #3

## 2016-11-15 MED FILL — GLIMEPIRIDE 1 MG TABLET: 1 | 90 days supply | Qty: 90 | Fill #1

## 2016-11-19 ENCOUNTER — Other Ambulatory Visit (INDEPENDENT_AMBULATORY_CARE_PROVIDER_SITE_OTHER): Payer: BLUE CROSS/BLUE SHIELD

## 2016-11-19 ENCOUNTER — Encounter: Payer: Self-pay | Admitting: Internal Medicine

## 2016-11-19 ENCOUNTER — Ambulatory Visit (INDEPENDENT_AMBULATORY_CARE_PROVIDER_SITE_OTHER): Payer: BLUE CROSS/BLUE SHIELD | Admitting: Internal Medicine

## 2016-11-19 DIAGNOSIS — M150 Primary generalized (osteo)arthritis: Secondary | ICD-10-CM

## 2016-11-19 DIAGNOSIS — I10 Essential (primary) hypertension: Secondary | ICD-10-CM

## 2016-11-19 DIAGNOSIS — E119 Type 2 diabetes mellitus without complications: Secondary | ICD-10-CM

## 2016-11-19 DIAGNOSIS — M8949 Other hypertrophic osteoarthropathy, multiple sites: Secondary | ICD-10-CM

## 2016-11-19 DIAGNOSIS — M5440 Lumbago with sciatica, unspecified side: Secondary | ICD-10-CM | POA: Diagnosis not present

## 2016-11-19 DIAGNOSIS — M159 Polyosteoarthritis, unspecified: Secondary | ICD-10-CM

## 2016-11-19 DIAGNOSIS — G8929 Other chronic pain: Secondary | ICD-10-CM

## 2016-11-19 LAB — BASIC METABOLIC PANEL
BUN: 59 mg/dL — ABNORMAL HIGH (ref 6–23)
CO2: 30 mEq/L (ref 19–32)
Calcium: 9 mg/dL (ref 8.4–10.5)
Chloride: 97 mEq/L (ref 96–112)
Creatinine, Ser: 2.87 mg/dL — ABNORMAL HIGH (ref 0.40–1.20)
GFR: 17.53 mL/min — ABNORMAL LOW (ref >60.00)
Glucose, Bld: 144 mg/dL — ABNORMAL HIGH (ref 70–99)
Potassium: 4.2 mEq/L (ref 3.5–5.1)
Sodium: 137 mEq/L (ref 135–145)

## 2016-11-19 LAB — HEMOGLOBIN A1C: Hgb A1c MFr Bld: 6.6 % — ABNORMAL HIGH (ref 4.6–6.5)

## 2016-11-19 MED ORDER — LIDOCAINE 5 % EX OINT: TOPICAL_OINTMENT | Freq: Three times a day (TID) | CUTANEOUS | 1 refills | Status: DC | PRN

## 2016-11-19 MED ORDER — LORAZEPAM 2 MG PO TABS: 1.0000 mg | ORAL_TABLET | Freq: Four times a day (QID) | ORAL | 3 refills | Status: DC | PRN

## 2016-11-19 MED ORDER — OXYBUTYNIN CHLORIDE ER 5 MG PO TB24: 5.0000 mg | ORAL_TABLET | Freq: Every day | ORAL | 3 refills | Status: DC

## 2016-11-19 MED ORDER — OXYCODONE HCL ER 20 MG PO T12A: 20.0000 mg | EXTENDED_RELEASE_TABLET | Freq: Two times a day (BID) | ORAL | 0 refills | Status: DC

## 2016-11-19 MED ORDER — OXYCODONE HCL 15 MG PO TABS: 15.0000 mg | ORAL_TABLET | Freq: Four times a day (QID) | ORAL | 0 refills | Status: DC | PRN

## 2016-11-19 MED FILL — LIDOCAINE 5% OINTMENT: 5 | 15 days supply | Qty: 30 | Fill #0

## 2016-11-19 MED FILL — OXYBUTYNIN CL ER 5 MG TAB: 5 | 90 days supply | Qty: 90 | Fill #0

## 2016-11-19 NOTE — Addendum Note (Signed)
Addended by: Cresenciano Lick on: 11/19/2016 02:23 PM   Modules accepted: Orders

## 2016-11-19 NOTE — Progress Notes (Signed)
Pre visit review using our clinic review tool, if applicable. No additional management support is needed unless otherwise documented below in the visit note. 

## 2016-11-19 NOTE — Assessment & Plan Note (Signed)
Oxy   Potential benefits of a long term opioids use as well as potential risks (i.e. addiction risk, apnea etc) and complications (i.e. Somnolence, constipation and others) were explained to the patient and were aknowledged.

## 2016-11-19 NOTE — Assessment & Plan Note (Signed)
Coreg

## 2016-11-19 NOTE — Progress Notes (Signed)
Subjective:  Patient ID: [name], female    DOB: 16-Dec-1950  Age: 65 y.o. MRN: [id number]  CC: No chief complaint on file.   HPI [name] presents for DM, HTN, OA - chronic LBP f/u  Outpatient Medications Prior to Visit  Medication Sig Dispense Refill  . amoxicillin (AMOXIL) 500 MG capsule Take 4 by mouth one hour prior to dental procedure/cleaning. 16 capsule 1  . buPROPion (WELLBUTRIN XL) 150 MG 24 hr tablet TAKE 1 TABLET BY MOUTH TWICE DAILY 180 tablet 3  . carvedilol (COREG) 25 MG tablet Take 1 tablet (25 mg total) by mouth 2 (two) times daily with a meal. 180 tablet 3  . cevimeline (EVOXAC) 30 MG capsule TAKE 2 CAPSULES BY MOUTH IN THE MORNING AND 1 CAPSULE IN THE EVENING 270 capsule 3  . cyclobenzaprine (FLEXERIL) 10 MG tablet Take 3 tablets (30 mg total) by mouth at bedtime. 270 tablet 3  . DULoxetine (CYMBALTA) 60 MG capsule Take 1 capsule (60 mg total) by mouth at bedtime. 90 capsule 3  . estradiol (ESTRACE) 0.5 MG tablet Take 0.5 mg by mouth daily.    . furosemide (LASIX) 40 MG tablet TAKE 1/2 TO 1 TABLET BY MOUTH ONCE DAILY AS NEEDED FOR SWELLING 90 tablet 3  . glimepiride (AMARYL) 1 MG tablet TAKE 1 TABLET BY MOUTH DAILY BEFORE BREAKFAST. 90 tablet 3  . glucose blood (ONE TOUCH TEST STRIPS) test strip Use as instructed 50 each 3  . ibuprofen (ADVIL,MOTRIN) 200 MG tablet Take 800 mg by mouth 2 (two) times daily as needed (For pain.).    Marland Kitchen Lancets (ONETOUCH ULTRASOFT) lancets Use as instructed 100 each 3  . lidocaine (LIDODERM) 5 % PLACE 1-2 PATCHES ONTO THE SKIN AS DIRECTED 60 patch 4  . lidocaine (XYLOCAINE) 5 % ointment 2 (two) times daily.  0  . LORazepam (ATIVAN) 2 MG tablet Take 0.5 tablets (1 mg total) by mouth every 6 (six) hours as needed for anxiety. 60 tablet 3  . LUTEIN PO Take 1 tablet by mouth at bedtime.    . medroxyPROGESTERone (PROVERA) 2.5 MG tablet Take 2.5 mg by mouth daily.    . metFORMIN (GLUCOPHAGE) 1000 MG tablet TAKE 1 TABLET BY MOUTH 2  TIMES DAILY WITH A MEAL. 180 tablet 1  . Multiple Vitamin (MULTIVITAMIN WITH MINERALS) TABS tablet Take 1 tablet by mouth daily.    . Omega-3 300 MG CAPS Take 300 mg by mouth at bedtime.    Marland Kitchen oxybutynin (DITROPAN) 5 MG tablet Take 0.5 tablets (2.5 mg total) by mouth daily. 45 tablet 1  . oxyCODONE (OXYCONTIN) 20 mg 12 hr tablet Take 1 tablet (20 mg total) by mouth every 12 (twelve) hours. Please fill on or after 10/20/16 60 tablet 0  . oxyCODONE (ROXICODONE) 15 MG immediate release tablet Take 1 tablet (15 mg total) by mouth every 6 (six) hours as needed. Please fill on or after 10/20/16 120 tablet 0  . pantoprazole (PROTONIX) 40 MG tablet Take 1 tablet (40 mg total) by mouth daily. 90 tablet 3  . pioglitazone (ACTOS) 15 MG tablet TAKE 1 TABLET BY MOUTH ONCE DAILY 90 tablet 3  . polyvinyl alcohol (LIQUIFILM TEARS) 1.4 % ophthalmic solution Place 1 drop into both eyes daily as needed for dry eyes.    . PSYLLIUM PO Take 1 capsule by mouth 2 (two) times daily.    Marland Kitchen triamcinolone ointment (KENALOG) 0.5 % Apply 1 application topically 2 (two) times daily. 30 g 0  .  Vitamin D, Cholecalciferol, 1000 UNITS TABS Take 1,000 Units by mouth daily.    . colchicine 0.6 MG tablet Take 1 tablet (0.6 mg total) by mouth daily. (Patient not taking: Reported on 11/19/2016) 30 tablet 3  . famotidine (PEPCID) 10 MG tablet Take 10 mg by mouth 2 (two) times daily. Reported on 05/12/2016    . ferrous sulfate 325 (65 FE) MG tablet Take 325 mg by mouth daily with breakfast. Reported on 05/12/2016    . meclizine (ANTIVERT) 12.5 MG tablet Take 1 tablet (12.5 mg total) by mouth 3 (three) times daily as needed for dizziness. (Patient not taking: Reported on 11/19/2016) 60 tablet 1  . omeprazole (PRILOSEC) 20 MG capsule Take 1 capsule (20 mg total) by mouth daily. (Patient not taking: Reported on 11/19/2016) 90 capsule 3   No facility-administered medications prior to visit.     ROS Review of Systems  Constitutional: Positive for  fatigue. Negative for activity change, appetite change, chills and unexpected weight change.  HENT: Negative for congestion, mouth sores and sinus pressure.   Eyes: Negative for visual disturbance.  Respiratory: Negative for cough and chest tightness.   Gastrointestinal: Negative for abdominal pain and nausea.  Genitourinary: Negative for difficulty urinating, frequency and vaginal pain.  Musculoskeletal: Positive for arthralgias and back pain. Negative for gait problem.  Skin: Negative for pallor and rash.  Neurological: Negative for dizziness, tremors, weakness, numbness and headaches.  Psychiatric/Behavioral: Negative for confusion and sleep disturbance. The patient is nervous/anxious.     Objective:  BP 140/80   Pulse 83   Wt 257 lb (116.6 kg)   SpO2 93%   BMI 45.53 kg/m   BP Readings from Last 3 Encounters:  11/19/16 140/80  08/20/16 120/64  07/06/16 128/90    Wt Readings from Last 3 Encounters:  11/19/16 257 lb (116.6 kg)  08/20/16 251 lb (113.9 kg)  07/06/16 250 lb (113.4 kg)    Physical Exam  Constitutional: She appears well-developed. No distress.  HENT:  Head: Normocephalic.  Right Ear: External ear normal.  Left Ear: External ear normal.  Nose: Nose normal.  Mouth/Throat: Oropharynx is clear and moist.  Eyes: Conjunctivae are normal. Pupils are equal, round, and reactive to light. Right eye exhibits no discharge. Left eye exhibits no discharge.  Neck: Normal range of motion. Neck supple. No JVD present. No tracheal deviation present. No thyromegaly present.  Cardiovascular: Normal rate, regular rhythm and normal heart sounds.   Pulmonary/Chest: No stridor. No respiratory distress. She has no wheezes.  Abdominal: Soft. Bowel sounds are normal. She exhibits no distension and no mass. There is no tenderness. There is no rebound and no guarding.  Musculoskeletal: She exhibits tenderness. She exhibits no edema.  Lymphadenopathy:    She has no cervical adenopathy.    Neurological: She displays normal reflexes. No cranial nerve deficit. She exhibits normal muscle tone. Coordination abnormal.  Skin: No rash noted. No erythema.  Psychiatric: She has a normal mood and affect. Her behavior is normal. Judgment and thought content normal.  Obese LS spine, knees - tender  Lab Results  Component Value Date   WBC 10.7 (H) 04/28/2016   HGB 10.4 (L) 04/28/2016   HCT 31.6 (L) 04/28/2016   PLT 251.0 04/28/2016   GLUCOSE 74 08/20/2016   CHOL 215 (H) 09/25/2014   TRIG 154.0 (H) 09/25/2014   HDL 46.00 09/25/2014   LDLCALC 138 (H) 09/25/2014   ALT 10 08/20/2016   AST 12 08/20/2016   NA 132 (L) 08/20/2016  K 4.3 08/20/2016   CL 95 (L) 08/20/2016   CREATININE 1.95 (H) 08/20/2016   BUN 43 (H) 08/20/2016   CO2 31 08/20/2016   TSH 1.64 07/25/2013   INR 1.04 02/26/2015   HGBA1C 6.7 (H) 08/20/2016    Dg Chest 2 View  Result Date: 03/01/2016 CLINICAL DATA:  65 year old female with shortness of breath and productive cough for several weeks. EXAM: CHEST  2 VIEW COMPARISON:  Chest x-ray 12/01/2013. FINDINGS: Lung volumes are normal. No consolidative airspace disease. No pleural effusions. No pneumothorax. No pulmonary nodule or mass noted. Pulmonary vasculature and the cardiomediastinal silhouette are within normal limits. IMPRESSION: No radiographic evidence of acute cardiopulmonary disease. Electronically Signed   By: Vinnie Langton M.D.   On: 03/01/2016 17:40    Assessment & Plan:   There are no diagnoses linked to this encounter. I am having Ms. Hoselton maintain her onetouch ultrasoft, glucose blood, polyvinyl alcohol, Vitamin D (Cholecalciferol), ibuprofen, LUTEIN PO, multivitamin with minerals, Omega-3, PSYLLIUM PO, omeprazole, estradiol, medroxyPROGESTERone, famotidine, ferrous sulfate, amoxicillin, meclizine, pantoprazole, pioglitazone, carvedilol, cyclobenzaprine, colchicine, lidocaine, DULoxetine, cevimeline, buPROPion, glimepiride, furosemide, lidocaine,  triamcinolone ointment, oxyCODONE, oxyCODONE, LORazepam, metFORMIN, oxybutynin, and clindamycin.  Meds ordered this encounter  Medications  . clindamycin (CLINDAGEL) 1 % gel    Sig: daily.    Refill:  1     Follow-up: No Follow-up on file.  [name], MD

## 2016-11-19 NOTE — Assessment & Plan Note (Signed)
Actos, Metformin, Glimepiride  Labs

## 2016-11-19 NOTE — Assessment & Plan Note (Signed)
Oxy/percocet  Potential benefits of a long term opioids use as well as potential risks (i.e. addiction risk, apnea etc) and complications (i.e. Somnolence, constipation and others) were explained to the patient and were aknowledged.

## 2016-11-22 MED FILL — FUROSEMIDE 40 MG TABLET: 40 | 30 days supply | Qty: 30 | Fill #2

## 2016-11-23 MED FILL — LORazepam 2 MG TABS: 2 | 30 days supply | Qty: 60 | Fill #2

## 2016-11-23 MED FILL — OxyCONTIN 20 MG T12A: 20 | 30 days supply | Qty: 60 | Fill #0

## 2016-11-23 MED FILL — oxyCODONE HCL 15 MG TABS: 15 | 30 days supply | Qty: 120 | Fill #0

## 2016-11-24 ENCOUNTER — Other Ambulatory Visit: Payer: Self-pay | Admitting: Internal Medicine

## 2016-11-24 DIAGNOSIS — N189 Chronic kidney disease, unspecified: Secondary | ICD-10-CM

## 2016-12-01 MED FILL — MOMETASONE FUROATE 0.1% CRM: 0.1 | 30 days supply | Qty: 45 | Fill #0

## 2016-12-01 MED FILL — DOXYCYCLINE HYCLATE 100 MG: 100 | 10 days supply | Qty: 20 | Fill #0

## 2016-12-09 ENCOUNTER — Telehealth: Payer: Self-pay | Admitting: Internal Medicine

## 2016-12-09 MED FILL — PIOGLITAZONE HCL 15 MG TAB: 15 | 90 days supply | Qty: 90 | Fill #3

## 2016-12-09 NOTE — Telephone Encounter (Signed)
It is for diabetic nephropathy  Thx

## 2016-12-09 NOTE — Telephone Encounter (Signed)
Spoke to [facility] they said pt told them she has no idea why she is seeing them. They referred her to call us regarding it.  Also, Pt wanted to wait till her Medicare kicks in  in February to see them. She is scheduled for 2/8

## 2016-12-09 NOTE — Telephone Encounter (Signed)
Pt called back, explain to pt why Dr. Camila Li wants her to see the kidney doctor. Pt stated she never got the test result until now and she can not go see any doctor until her insurance kick in (which is in Feb 2018) FYI

## 2016-12-13 MED FILL — BUPROPION HCL XL 150 MG TAB: 150 | 90 days supply | Qty: 180 | Fill #1

## 2016-12-14 NOTE — Telephone Encounter (Signed)
Patient aware.

## 2016-12-20 MED FILL — FUROSEMIDE 40 MG TABLET: 40 | 90 days supply | Qty: 90 | Fill #3

## 2016-12-24 MED FILL — oxyCODONE HCL 15 MG TABS: 15 | 30 days supply | Qty: 120 | Fill #0

## 2016-12-24 MED FILL — OxyCONTIN 20 MG T12A: 20 | 30 days supply | Qty: 60 | Fill #0

## 2016-12-28 ENCOUNTER — Telehealth: Payer: Self-pay | Admitting: *Deleted

## 2016-12-28 NOTE — Telephone Encounter (Signed)
I'm not comfortable to Rx: too many potential side effects Thx

## 2016-12-28 NOTE — Telephone Encounter (Signed)
Rec fax requesting a new Rx for pilocarpine 5 mg as an alternative for cevimeline 30 mg due to cost increase.   Pt wants to try pilocarpine 5 mg x 1 month. Please advise.

## 2016-12-29 MED FILL — LORazepam 2 MG TABS: 2 | 30 days supply | Qty: 60 | Fill #3

## 2016-12-31 MED FILL — CEVIMELINE HCL 30 MG CAP: 30 | 30 days supply | Qty: 90 | Fill #2

## 2016-12-31 NOTE — Telephone Encounter (Signed)
Pharmacy informed.

## 2017-01-10 MED FILL — CARVEDILOL 25 MG TABLET: 25 | 30 days supply | Qty: 60 | Fill #3

## 2017-01-10 MED FILL — MEDROXYPROGESTERONE 2.5 MG: 2.5 | 30 days supply | Qty: 30 | Fill #4

## 2017-01-10 MED FILL — ESTRADIOL 0.5 MG TABLET: 0.5 | 90 days supply | Qty: 90 | Fill #0

## 2017-01-20 DIAGNOSIS — I10 Essential (primary) hypertension: Secondary | ICD-10-CM | POA: Diagnosis not present

## 2017-01-20 DIAGNOSIS — N140 Analgesic nephropathy: Secondary | ICD-10-CM | POA: Diagnosis not present

## 2017-01-20 DIAGNOSIS — E1129 Type 2 diabetes mellitus with other diabetic kidney complication: Secondary | ICD-10-CM | POA: Diagnosis not present

## 2017-01-20 DIAGNOSIS — G8929 Other chronic pain: Secondary | ICD-10-CM | POA: Diagnosis not present

## 2017-01-20 DIAGNOSIS — N189 Chronic kidney disease, unspecified: Secondary | ICD-10-CM | POA: Diagnosis not present

## 2017-01-24 ENCOUNTER — Other Ambulatory Visit: Payer: Self-pay | Admitting: Internal Medicine

## 2017-01-24 MED FILL — metFORMIN HCL 1000 MG TABS: 1000 | 90 days supply | Qty: 180 | Fill #1

## 2017-01-24 MED FILL — PANTOPRAZOLE SOD DR 40 MG T: 40 | 90 days supply | Qty: 90 | Fill #0

## 2017-01-24 MED FILL — CYCLOBENZAPRINE 10 MG TAB: 10 | 90 days supply | Qty: 270 | Fill #3

## 2017-01-26 ENCOUNTER — Other Ambulatory Visit: Payer: Self-pay | Admitting: Nephrology

## 2017-01-26 DIAGNOSIS — N19 Unspecified kidney failure: Secondary | ICD-10-CM

## 2017-01-26 MED FILL — OxyCONTIN 20 MG T12A: 20 | 30 days supply | Qty: 60 | Fill #0

## 2017-01-27 ENCOUNTER — Other Ambulatory Visit: Payer: Self-pay | Admitting: *Deleted

## 2017-01-27 ENCOUNTER — Telehealth: Payer: Self-pay | Admitting: *Deleted

## 2017-01-27 NOTE — Telephone Encounter (Signed)
Rec'd call pt states MD gave her two scripts on the Oxycontin 20 mg, instead of giver her rx for the Oxycodone 15 mg. She states she take the 15 mg for break through. Pt requesting script for Oxycodone 15 mg for the month of Feb.../lmb

## 2017-01-30 NOTE — Telephone Encounter (Signed)
OK. Thx

## 2017-01-31 ENCOUNTER — Ambulatory Visit
Admission: RE | Admit: 2017-01-31 | Discharge: 2017-01-31 | Disposition: A | Discharge status: Home / Self Care | Payer: Medicare Other | Source: Ambulatory Visit | Attending: Nephrology | Admitting: Nephrology

## 2017-01-31 ENCOUNTER — Telehealth: Payer: Self-pay

## 2017-01-31 DIAGNOSIS — N189 Chronic kidney disease, unspecified: Secondary | ICD-10-CM | POA: Diagnosis not present

## 2017-01-31 DIAGNOSIS — N19 Unspecified kidney failure: Secondary | ICD-10-CM

## 2017-01-31 MED ORDER — OXYCODONE HCL 15 MG PO TABS: 15.0000 mg | ORAL_TABLET | Freq: Four times a day (QID) | ORAL | 0 refills | Status: DC | PRN

## 2017-01-31 MED FILL — oxyCODONE HCL 15 MG TABS: 15 | 7 days supply | Qty: 30 | Fill #0

## 2017-01-31 MED FILL — CEVIMELINE HCL 30 MG CAP: 30 | 30 days supply | Qty: 90 | Fill #3

## 2017-01-31 MED FILL — GLIMEPIRIDE 1 MG TABLET: 1 | 90 days supply | Qty: 90 | Fill #2

## 2017-01-31 MED FILL — DULoxetine HCL 60 MG CPEP: 60 | 90 days supply | Qty: 90 | Fill #3

## 2017-01-31 NOTE — Telephone Encounter (Signed)
The rx was only made for 30 tablets, which is not enough to last for entire month---I am reprinting rx for 120 tablets per dr plotnikov's ok and patient request

## 2017-01-31 NOTE — Telephone Encounter (Signed)
Notified pt MD ok rx has been printed and ready for pick-up...[name]

## 2017-02-07 ENCOUNTER — Other Ambulatory Visit (HOSPITAL_COMMUNITY): Payer: Self-pay | Admitting: *Deleted

## 2017-02-08 ENCOUNTER — Ambulatory Visit (HOSPITAL_COMMUNITY)
Admission: RE | Admit: 2017-02-08 | Discharge: 2017-02-08 | Disposition: A | Discharge status: Home / Self Care | Payer: Medicare Other | Source: Ambulatory Visit | Attending: Nephrology | Admitting: Nephrology

## 2017-02-08 DIAGNOSIS — N189 Chronic kidney disease, unspecified: Secondary | ICD-10-CM | POA: Insufficient documentation

## 2017-02-08 DIAGNOSIS — D631 Anemia in chronic kidney disease: Secondary | ICD-10-CM | POA: Insufficient documentation

## 2017-02-08 MED ORDER — SODIUM CHLORIDE 0.9 % IV SOLN
510.0000 mg | INTRAVENOUS | Status: DC
  Administered 2017-02-08: 510 mg via INTRAVENOUS
  Filled 2017-02-08: qty 17

## 2017-02-08 NOTE — Discharge Instructions (Signed)

## 2017-02-10 DIAGNOSIS — S46011A Strain of muscle(s) and tendon(s) of the rotator cuff of right shoulder, initial encounter: Secondary | ICD-10-CM | POA: Diagnosis not present

## 2017-02-10 DIAGNOSIS — M19011 Primary osteoarthritis, right shoulder: Secondary | ICD-10-CM | POA: Diagnosis not present

## 2017-02-10 DIAGNOSIS — M25511 Pain in right shoulder: Secondary | ICD-10-CM | POA: Diagnosis not present

## 2017-02-16 ENCOUNTER — Other Ambulatory Visit (HOSPITAL_COMMUNITY): Payer: Self-pay | Admitting: *Deleted

## 2017-02-16 DIAGNOSIS — Z124 Encounter for screening for malignant neoplasm of cervix: Secondary | ICD-10-CM | POA: Diagnosis not present

## 2017-02-16 DIAGNOSIS — Z1231 Encounter for screening mammogram for malignant neoplasm of breast: Secondary | ICD-10-CM | POA: Diagnosis not present

## 2017-02-16 DIAGNOSIS — N958 Other specified menopausal and perimenopausal disorders: Secondary | ICD-10-CM | POA: Diagnosis not present

## 2017-02-16 DIAGNOSIS — Z01419 Encounter for gynecological examination (general) (routine) without abnormal findings: Secondary | ICD-10-CM | POA: Diagnosis not present

## 2017-02-16 DIAGNOSIS — Z13820 Encounter for screening for osteoporosis: Secondary | ICD-10-CM | POA: Diagnosis not present

## 2017-02-17 ENCOUNTER — Ambulatory Visit (HOSPITAL_COMMUNITY)
Admission: RE | Admit: 2017-02-17 | Discharge: 2017-02-17 | Disposition: A | Discharge status: Home / Self Care | Payer: Medicare Other | Source: Ambulatory Visit | Attending: Nephrology | Admitting: Nephrology

## 2017-02-17 DIAGNOSIS — D631 Anemia in chronic kidney disease: Secondary | ICD-10-CM | POA: Diagnosis not present

## 2017-02-17 DIAGNOSIS — N189 Chronic kidney disease, unspecified: Secondary | ICD-10-CM | POA: Insufficient documentation

## 2017-02-17 MED ORDER — SODIUM CHLORIDE 0.9 % IV SOLN
510.0000 mg | INTRAVENOUS | Status: DC
  Administered 2017-02-17: 510 mg via INTRAVENOUS
  Filled 2017-02-17: qty 17

## 2017-02-18 ENCOUNTER — Other Ambulatory Visit (INDEPENDENT_AMBULATORY_CARE_PROVIDER_SITE_OTHER): Payer: Medicare Other

## 2017-02-18 ENCOUNTER — Ambulatory Visit (INDEPENDENT_AMBULATORY_CARE_PROVIDER_SITE_OTHER): Payer: Medicare Other | Admitting: Internal Medicine

## 2017-02-18 ENCOUNTER — Encounter: Payer: Self-pay | Admitting: Internal Medicine

## 2017-02-18 DIAGNOSIS — E119 Type 2 diabetes mellitus without complications: Secondary | ICD-10-CM

## 2017-02-18 DIAGNOSIS — I10 Essential (primary) hypertension: Secondary | ICD-10-CM

## 2017-02-18 DIAGNOSIS — M5440 Lumbago with sciatica, unspecified side: Secondary | ICD-10-CM

## 2017-02-18 DIAGNOSIS — N189 Chronic kidney disease, unspecified: Secondary | ICD-10-CM | POA: Diagnosis not present

## 2017-02-18 DIAGNOSIS — K2100 Gastro-esophageal reflux disease with esophagitis, without bleeding: Secondary | ICD-10-CM

## 2017-02-18 DIAGNOSIS — G8929 Other chronic pain: Secondary | ICD-10-CM | POA: Diagnosis not present

## 2017-02-18 DIAGNOSIS — F411 Generalized anxiety disorder: Secondary | ICD-10-CM

## 2017-02-18 LAB — BASIC METABOLIC PANEL
BUN: 42 mg/dL — ABNORMAL HIGH (ref 6–23)
CO2: 29 mEq/L (ref 19–32)
Calcium: 10.2 mg/dL (ref 8.4–10.5)
Chloride: 96 mEq/L (ref 96–112)
Creatinine, Ser: 1.81 mg/dL — ABNORMAL HIGH (ref 0.40–1.20)
GFR: 29.82 mL/min — ABNORMAL LOW (ref >60.00)
Glucose, Bld: 115 mg/dL — ABNORMAL HIGH (ref 70–99)
Potassium: 4.4 mEq/L (ref 3.5–5.1)
Sodium: 134 mEq/L — ABNORMAL LOW (ref 135–145)

## 2017-02-18 LAB — HEMOGLOBIN A1C: Hgb A1c MFr Bld: 6.6 % — ABNORMAL HIGH (ref 4.6–6.5)

## 2017-02-18 MED ORDER — OXYCODONE HCL 15 MG PO TABS: 15.0000 mg | ORAL_TABLET | Freq: Four times a day (QID) | ORAL | 0 refills | Status: DC | PRN

## 2017-02-18 MED ORDER — OXYCODONE ER 18 MG PO C12A: 18.0000 mg | EXTENDED_RELEASE_CAPSULE | Freq: Two times a day (BID) | ORAL | 0 refills | Status: DC

## 2017-02-18 MED ORDER — LORAZEPAM 2 MG PO TABS: 1.0000 mg | ORAL_TABLET | Freq: Four times a day (QID) | ORAL | 3 refills | Status: DC | PRN

## 2017-02-18 NOTE — Assessment & Plan Note (Signed)
On Rx 

## 2017-02-18 NOTE — Assessment & Plan Note (Signed)
On Coreg 

## 2017-02-18 NOTE — Assessment & Plan Note (Signed)
Oxycontin is not covered

## 2017-02-18 NOTE — Progress Notes (Signed)
Subjective:  Patient ID: [name], female    DOB: 05-06-1951  Age: 66 y.o. MRN: [id number]  CC: Follow-up (dm type 2, HTN, chronic pain)   HPI [name] presents for OA/knee pain, CRI, DM, HTN and anemia f/u Oxycontin is not covered  Outpatient Medications Prior to Visit  Medication Sig Dispense Refill  . amoxicillin (AMOXIL) 500 MG capsule Take 4 by mouth one hour prior to dental procedure/cleaning. 16 capsule 1  . buPROPion (WELLBUTRIN XL) 150 MG 24 hr tablet TAKE 1 TABLET BY MOUTH TWICE DAILY 180 tablet 3  . carvedilol (COREG) 25 MG tablet Take 1 tablet (25 mg total) by mouth 2 (two) times daily with a meal. (Patient taking differently: Take 25 mg by mouth 2 (two) times daily with a meal. Takes 1/2 tablet) 180 tablet 3  . cevimeline (EVOXAC) 30 MG capsule TAKE 2 CAPSULES BY MOUTH IN THE MORNING AND 1 CAPSULE IN THE EVENING 270 capsule 3  . cyclobenzaprine (FLEXERIL) 10 MG tablet Take 3 tablets (30 mg total) by mouth at bedtime. 270 tablet 3  . DULoxetine (CYMBALTA) 60 MG capsule Take 1 capsule (60 mg total) by mouth at bedtime. 90 capsule 3  . furosemide (LASIX) 40 MG tablet TAKE 1/2 TO 1 TABLET BY MOUTH ONCE DAILY AS NEEDED FOR SWELLING 90 tablet 3  . glimepiride (AMARYL) 1 MG tablet TAKE 1 TABLET BY MOUTH DAILY BEFORE BREAKFAST. 90 tablet 3  . glucose blood (ONE TOUCH TEST STRIPS) test strip Use as instructed 50 each 3  . Lancets (ONETOUCH ULTRASOFT) lancets Use as instructed 100 each 3  . lidocaine (LIDODERM) 5 % PLACE 1-2 PATCHES ONTO THE SKIN AS DIRECTED 60 patch 4  . lidocaine (XYLOCAINE) 5 % ointment Apply topically 3 (three) times daily as needed. 50 g 1  . LORazepam (ATIVAN) 2 MG tablet Take 0.5 tablets (1 mg total) by mouth every 6 (six) hours as needed for anxiety. 60 tablet 3  . LUTEIN PO Take 1 tablet by mouth at bedtime.    . metFORMIN (GLUCOPHAGE) 1000 MG tablet TAKE 1 TABLET BY MOUTH 2 TIMES DAILY WITH A MEAL. 180 tablet 1  . Multiple Vitamin (MULTIVITAMIN  WITH MINERALS) TABS tablet Take 1 tablet by mouth daily.    . Omega-3 300 MG CAPS Take 300 mg by mouth at bedtime.    Marland Kitchen oxybutynin (DITROPAN-XL) 5 MG 24 hr tablet Take 1 tablet (5 mg total) by mouth at bedtime. 90 tablet 3  . oxyCODONE (OXYCONTIN) 20 mg 12 hr tablet Take 1 tablet (20 mg total) by mouth every 12 (twelve) hours. Please fill on or after 01/20/17 60 tablet 0  . oxyCODONE (OXYCONTIN) 20 mg 12 hr tablet Take 1 tablet (20 mg total) by mouth every 12 (twelve) hours. Please fill on or after 01/20/17 60 tablet 0  . oxyCODONE (ROXICODONE) 15 MG immediate release tablet Take 1 tablet (15 mg total) by mouth every 6 (six) hours as needed for pain. 120 tablet 0  . oxyCODONE (ROXICODONE) 15 MG immediate release tablet Take 1 tablet (15 mg total) by mouth every 6 (six) hours as needed for pain. 120 tablet 0  . pantoprazole (PROTONIX) 40 MG tablet TAKE 1 TABLET BY MOUTH DAILY 90 tablet 2  . pioglitazone (ACTOS) 15 MG tablet TAKE 1 TABLET BY MOUTH ONCE DAILY 90 tablet 3  . polyvinyl alcohol (LIQUIFILM TEARS) 1.4 % ophthalmic solution Place 1 drop into both eyes daily as needed for dry eyes.    Marland Kitchen  PSYLLIUM PO Take 1 capsule by mouth 2 (two) times daily.    . Vitamin D, Cholecalciferol, 1000 UNITS TABS Take 1,000 Units by mouth daily.    . clindamycin (CLINDAGEL) 1 % gel daily.  1  . colchicine 0.6 MG tablet Take 1 tablet (0.6 mg total) by mouth daily. (Patient not taking: Reported on 11/19/2016) 30 tablet 3  . estradiol (ESTRACE) 0.5 MG tablet Take 0.5 mg by mouth daily.    . famotidine (PEPCID) 10 MG tablet Take 10 mg by mouth 2 (two) times daily. Reported on 05/12/2016    . ferrous sulfate 325 (65 FE) MG tablet Take 325 mg by mouth daily with breakfast. Reported on 05/12/2016    . ibuprofen (ADVIL,MOTRIN) 200 MG tablet Take 800 mg by mouth 2 (two) times daily as needed (For pain.).    Marland Kitchen meclizine (ANTIVERT) 12.5 MG tablet Take 1 tablet (12.5 mg total) by mouth 3 (three) times daily as needed for dizziness.  (Patient not taking: Reported on 11/19/2016) 60 tablet 1  . medroxyPROGESTERone (PROVERA) 2.5 MG tablet Take 2.5 mg by mouth daily.    Marland Kitchen omeprazole (PRILOSEC) 20 MG capsule Take 1 capsule (20 mg total) by mouth daily. (Patient not taking: Reported on 11/19/2016) 90 capsule 3  . triamcinolone ointment (KENALOG) 0.5 % Apply 1 application topically 2 (two) times daily. 30 g 0   No facility-administered medications prior to visit.     ROS Review of Systems  Constitutional: Negative for activity change, appetite change, chills, fatigue and unexpected weight change.  HENT: Negative for congestion, mouth sores and sinus pressure.   Eyes: Negative for visual disturbance.  Respiratory: Negative for cough and chest tightness.   Gastrointestinal: Negative for abdominal pain and nausea.  Genitourinary: Negative for difficulty urinating, frequency and vaginal pain.  Musculoskeletal: Positive for arthralgias, back pain and gait problem.  Skin: Negative for pallor and rash.  Neurological: Negative for dizziness, tremors, weakness, numbness and headaches.  Psychiatric/Behavioral: Negative for confusion and sleep disturbance.    Objective:  BP 136/72   Pulse 82   Temp 98.3 F (36.8 C) (Oral)   Resp 16   Ht 5\' 3"  (1.6 m)   Wt 252 lb 8 oz (114.5 kg)   SpO2 96%   BMI 44.73 kg/m   BP Readings from Last 3 Encounters:  02/18/17 136/72  02/17/17 106/79  02/08/17 (!) 153/78    Wt Readings from Last 3 Encounters:  02/18/17 252 lb 8 oz (114.5 kg)  02/17/17 250 lb (113.4 kg)  02/08/17 261 lb (118.4 kg)    Physical Exam  Constitutional: She appears well-developed. No distress.  HENT:  Head: Normocephalic.  Right Ear: External ear normal.  Left Ear: External ear normal.  Nose: Nose normal.  Mouth/Throat: Oropharynx is clear and moist.  Eyes: Conjunctivae are normal. Pupils are equal, round, and reactive to light. Right eye exhibits no discharge. Left eye exhibits no discharge.  Neck: Normal  range of motion. Neck supple. No JVD present. No tracheal deviation present. No thyromegaly present.  Cardiovascular: Normal rate, regular rhythm and normal heart sounds.   Pulmonary/Chest: No stridor. No respiratory distress. She has no wheezes.  Abdominal: Soft. Bowel sounds are normal. She exhibits no distension and no mass. There is no tenderness. There is no rebound and no guarding.  Musculoskeletal: She exhibits tenderness. She exhibits no edema.  Lymphadenopathy:    She has no cervical adenopathy.  Neurological: She displays normal reflexes. No cranial nerve deficit. She exhibits normal muscle  tone. Coordination abnormal.  Skin: No rash noted. No erythema.  Psychiatric: Her behavior is normal. Judgment and thought content normal.  Sad Knees tender w/ROM Obese  Lab Results  Component Value Date   WBC 10.7 (H) 04/28/2016   HGB 10.4 (L) 04/28/2016   HCT 31.6 (L) 04/28/2016   PLT 251.0 04/28/2016   GLUCOSE 144 (H) 11/19/2016   CHOL 215 (H) 09/25/2014   TRIG 154.0 (H) 09/25/2014   HDL 46.00 09/25/2014   LDLCALC 138 (H) 09/25/2014   ALT 10 08/20/2016   AST 12 08/20/2016   NA 137 11/19/2016   K 4.2 11/19/2016   CL 97 11/19/2016   CREATININE 2.87 (H) 11/19/2016   BUN 59 (H) 11/19/2016   CO2 30 11/19/2016   TSH 1.64 07/25/2013   INR 1.04 02/26/2015   HGBA1C 6.6 (H) 11/19/2016    No results found.  Assessment & Plan:   There are no diagnoses linked to this encounter. I have discontinued Ms. Jepsen's ibuprofen, omeprazole, estradiol, medroxyPROGESTERone, famotidine, ferrous sulfate, meclizine, colchicine, triamcinolone ointment, and clindamycin. I am also having her maintain her onetouch ultrasoft, glucose blood, polyvinyl alcohol, Vitamin D (Cholecalciferol), LUTEIN PO, multivitamin with minerals, Omega-3, PSYLLIUM PO, amoxicillin, pioglitazone, carvedilol, cyclobenzaprine, lidocaine, DULoxetine, cevimeline, buPROPion, glimepiride, furosemide, metFORMIN, LORazepam, oxybutynin,  oxyCODONE, oxyCODONE, lidocaine, pantoprazole, oxyCODONE, and oxyCODONE.  No orders of the defined types were placed in this encounter.    Follow-up: No Follow-up on file.  [name], MD

## 2017-02-18 NOTE — Assessment & Plan Note (Signed)
Glimeperide, Metformin, Actos

## 2017-02-18 NOTE — Assessment & Plan Note (Signed)
Lorazepam prn 

## 2017-02-18 NOTE — Progress Notes (Signed)
Pre-visit discussion using our clinic review tool. No additional management support is needed unless otherwise documented below in the visit note.  

## 2017-02-28 ENCOUNTER — Other Ambulatory Visit: Payer: Self-pay | Admitting: Internal Medicine

## 2017-02-28 MED FILL — PIOGLITAZONE HCL 15 MG TAB: 15 | 90 days supply | Qty: 90 | Fill #0

## 2017-02-28 MED FILL — CEVIMELINE HCL 30 MG CAP: 30 | 30 days supply | Qty: 90 | Fill #4

## 2017-02-28 MED FILL — LORazepam 2 MG TABS: 2 | 30 days supply | Qty: 60 | Fill #0

## 2017-02-28 MED FILL — oxyCODONE HCL 15 MG TABS: 15 | 30 days supply | Qty: 120 | Fill #0

## 2017-02-28 MED FILL — CARVEDILOL 25 MG TABLET: 25 | 30 days supply | Qty: 60 | Fill #4

## 2017-02-28 MED FILL — XTAMPZA ER 18 MG C12A: 18 | 30 days supply | Qty: 60 | Fill #0

## 2017-02-28 MED FILL — BUPROPION HCL XL 150 MG TAB: 150 | 90 days supply | Qty: 180 | Fill #2

## 2017-02-28 MED FILL — AMOXICILLIN 500 MG CAPSULE: 500 | 4 days supply | Qty: 16 | Fill #0

## 2017-02-28 MED FILL — OXYBUTYNIN CL ER 5 MG TAB: 5 | 90 days supply | Qty: 90 | Fill #1

## 2017-03-04 DIAGNOSIS — Z471 Aftercare following joint replacement surgery: Secondary | ICD-10-CM | POA: Diagnosis not present

## 2017-03-04 DIAGNOSIS — Z96651 Presence of right artificial knee joint: Secondary | ICD-10-CM | POA: Diagnosis not present

## 2017-03-08 DIAGNOSIS — N281 Cyst of kidney, acquired: Secondary | ICD-10-CM | POA: Diagnosis not present

## 2017-03-10 DIAGNOSIS — M19011 Primary osteoarthritis, right shoulder: Secondary | ICD-10-CM | POA: Diagnosis not present

## 2017-03-10 DIAGNOSIS — M25511 Pain in right shoulder: Secondary | ICD-10-CM | POA: Diagnosis not present

## 2017-03-14 DIAGNOSIS — N189 Chronic kidney disease, unspecified: Secondary | ICD-10-CM | POA: Diagnosis not present

## 2017-03-14 DIAGNOSIS — E1129 Type 2 diabetes mellitus with other diabetic kidney complication: Secondary | ICD-10-CM | POA: Diagnosis not present

## 2017-03-14 DIAGNOSIS — D631 Anemia in chronic kidney disease: Secondary | ICD-10-CM | POA: Diagnosis not present

## 2017-03-14 DIAGNOSIS — N2581 Secondary hyperparathyroidism of renal origin: Secondary | ICD-10-CM | POA: Diagnosis not present

## 2017-03-14 DIAGNOSIS — I10 Essential (primary) hypertension: Secondary | ICD-10-CM | POA: Diagnosis not present

## 2017-03-21 MED FILL — MYRBETRIQ ER 50 MG TABLET: 50 | 30 days supply | Qty: 30 | Fill #0

## 2017-03-22 DIAGNOSIS — N760 Acute vaginitis: Secondary | ICD-10-CM | POA: Diagnosis not present

## 2017-03-22 MED FILL — NYSTATIN-TRIAMCINOLONE OINT: 100000-0.1 | 30 days supply | Qty: 30 | Fill #0

## 2017-03-28 MED FILL — FUROSEMIDE 40 MG TABLET: 40 | 90 days supply | Qty: 90 | Fill #4

## 2017-04-05 MED FILL — XTAMPZA ER 18 MG C12A: 18 | 30 days supply | Qty: 60 | Fill #0

## 2017-04-05 MED FILL — oxyCODONE HCL 15 MG TABS: 15 | 30 days supply | Qty: 120 | Fill #0

## 2017-04-07 MED FILL — LORazepam 2 MG TABS: 2 | 30 days supply | Qty: 60 | Fill #1

## 2017-04-13 MED FILL — LIDOCAINE 5% OINTMENT: 5 | 15 days supply | Qty: 30 | Fill #1

## 2017-04-21 ENCOUNTER — Other Ambulatory Visit: Payer: Self-pay | Admitting: Internal Medicine

## 2017-04-21 MED FILL — PANTOPRAZOLE SOD DR 40 MG T: 40 | 90 days supply | Qty: 90 | Fill #1

## 2017-04-21 MED FILL — metFORMIN HCL 1000 MG TABS: 1000 | 90 days supply | Qty: 180 | Fill #0

## 2017-04-21 MED FILL — CARVEDILOL 25 MG TABLET: 25 | 90 days supply | Qty: 180 | Fill #0

## 2017-04-21 MED FILL — CYCLOBENZAPRINE 10 MG TAB: 10 | 90 days supply | Qty: 270 | Fill #0

## 2017-04-21 MED FILL — MYRBETRIQ ER 50 MG TABLET: 50 | 30 days supply | Qty: 30 | Fill #0

## 2017-04-21 MED FILL — CEVIMELINE HCL 30 MG CAP: 30 | 30 days supply | Qty: 90 | Fill #5

## 2017-05-02 ENCOUNTER — Telehealth: Payer: Self-pay

## 2017-05-02 MED ORDER — PILOCARPINE HCL 5 MG PO TABS: 5.0000 mg | ORAL_TABLET | Freq: Two times a day (BID) | ORAL | 5 refills | Status: DC

## 2017-05-02 NOTE — Telephone Encounter (Signed)
We can try Salagen - see Rx Read side effects pls Thx

## 2017-05-02 NOTE — Telephone Encounter (Signed)
PA on Cevimeline was denied, please advise on what you would like to do.

## 2017-05-03 ENCOUNTER — Other Ambulatory Visit: Payer: Self-pay | Admitting: Internal Medicine

## 2017-05-03 MED FILL — DULoxetine HCL 60 MG CPEP: 60 | 90 days supply | Qty: 90 | Fill #0

## 2017-05-03 NOTE — Telephone Encounter (Signed)
LM notifying pt

## 2017-05-05 MED FILL — XTAMPZA ER 18 MG C12A: 18 | 30 days supply | Qty: 60 | Fill #0

## 2017-05-05 MED FILL — oxyCODONE HCL 15 MG TABS: 15 | 30 days supply | Qty: 120 | Fill #0

## 2017-05-06 ENCOUNTER — Telehealth: Payer: Self-pay | Admitting: Internal Medicine

## 2017-05-11 ENCOUNTER — Other Ambulatory Visit: Payer: Self-pay

## 2017-05-11 MED ORDER — PILOCARPINE HCL 5 MG PO TABS: 5.0000 mg | ORAL_TABLET | Freq: Two times a day (BID) | ORAL | 5 refills | Status: DC

## 2017-05-19 ENCOUNTER — Ambulatory Visit: Payer: Medicare Other | Admitting: Internal Medicine

## 2017-05-20 MED FILL — LORazepam 2 MG TABS: 2 | 30 days supply | Qty: 60 | Fill #2

## 2017-05-20 MED FILL — PILOCARPINE HCL 5 MG TABLET: 5 | 30 days supply | Qty: 60 | Fill #0

## 2017-05-20 MED FILL — MYRBETRIQ ER 50 MG TABLET: 50 | 30 days supply | Qty: 30 | Fill #0

## 2017-05-23 MED FILL — GLIMEPIRIDE 1 MG TABLET: 1 | 90 days supply | Qty: 90 | Fill #3

## 2017-05-23 MED FILL — PIOGLITAZONE HCL 15 MG TAB: 15 | 90 days supply | Qty: 90 | Fill #1

## 2017-05-31 MED FILL — BUPROPION HCL XL 150 MG TAB: 150 | 90 days supply | Qty: 180 | Fill #3

## 2017-06-10 ENCOUNTER — Encounter: Payer: Self-pay | Admitting: Internal Medicine

## 2017-06-10 ENCOUNTER — Ambulatory Visit (INDEPENDENT_AMBULATORY_CARE_PROVIDER_SITE_OTHER): Payer: Medicare Other | Admitting: Internal Medicine

## 2017-06-10 ENCOUNTER — Other Ambulatory Visit (INDEPENDENT_AMBULATORY_CARE_PROVIDER_SITE_OTHER): Payer: Medicare Other

## 2017-06-10 DIAGNOSIS — I10 Essential (primary) hypertension: Secondary | ICD-10-CM | POA: Diagnosis not present

## 2017-06-10 DIAGNOSIS — F411 Generalized anxiety disorder: Secondary | ICD-10-CM

## 2017-06-10 DIAGNOSIS — E119 Type 2 diabetes mellitus without complications: Secondary | ICD-10-CM

## 2017-06-10 DIAGNOSIS — M159 Polyosteoarthritis, unspecified: Secondary | ICD-10-CM

## 2017-06-10 DIAGNOSIS — M150 Primary generalized (osteo)arthritis: Secondary | ICD-10-CM

## 2017-06-10 DIAGNOSIS — R609 Edema, unspecified: Secondary | ICD-10-CM | POA: Diagnosis not present

## 2017-06-10 DIAGNOSIS — M8949 Other hypertrophic osteoarthropathy, multiple sites: Secondary | ICD-10-CM

## 2017-06-10 LAB — BASIC METABOLIC PANEL
BUN: 35 mg/dL — ABNORMAL HIGH (ref 6–23)
CO2: 31 mEq/L (ref 19–32)
Calcium: 9.5 mg/dL (ref 8.4–10.5)
Chloride: 96 mEq/L (ref 96–112)
Creatinine, Ser: 2.02 mg/dL — ABNORMAL HIGH (ref 0.40–1.20)
GFR: 26.25 mL/min — ABNORMAL LOW (ref >60.00)
Glucose, Bld: 98 mg/dL (ref 70–99)
Potassium: 4 mEq/L (ref 3.5–5.1)
Sodium: 136 mEq/L (ref 135–145)

## 2017-06-10 LAB — HEMOGLOBIN A1C: Hgb A1c MFr Bld: 6.5 % (ref 4.6–6.5)

## 2017-06-10 MED ORDER — LORAZEPAM 2 MG PO TABS: 1.0000 mg | ORAL_TABLET | Freq: Four times a day (QID) | ORAL | 3 refills | Status: DC | PRN

## 2017-06-10 MED ORDER — BUPROPION HCL ER (XL) 150 MG PO TB24: 150.0000 mg | ORAL_TABLET | Freq: Two times a day (BID) | ORAL | 3 refills | Status: DC

## 2017-06-10 MED ORDER — GLIMEPIRIDE 1 MG PO TABS: ORAL_TABLET | ORAL | 3 refills | Status: DC

## 2017-06-10 MED ORDER — FUROSEMIDE 40 MG PO TABS: 40.0000 mg | ORAL_TABLET | Freq: Every day | ORAL | 2 refills | Status: DC

## 2017-06-10 MED ORDER — LIDOCAINE 5 % EX OINT: TOPICAL_OINTMENT | Freq: Three times a day (TID) | CUTANEOUS | 1 refills | Status: DC | PRN

## 2017-06-10 MED ORDER — OXYCODONE HCL 15 MG PO TABS: 15.0000 mg | ORAL_TABLET | Freq: Four times a day (QID) | ORAL | 0 refills | Status: DC | PRN

## 2017-06-10 MED ORDER — OXYCODONE ER 18 MG PO C12A: 18.0000 mg | EXTENDED_RELEASE_CAPSULE | Freq: Two times a day (BID) | ORAL | 0 refills | Status: DC

## 2017-06-10 MED ORDER — CYCLOBENZAPRINE HCL 10 MG PO TABS: 30.0000 mg | ORAL_TABLET | Freq: Every day | ORAL | 0 refills | Status: DC

## 2017-06-10 MED FILL — XTAMPZA ER 18 MG C12A: 18 | 30 days supply | Qty: 60 | Fill #0

## 2017-06-10 MED FILL — LIDOCAINE 5% OINTMENT: 5 | 10 days supply | Qty: 30 | Fill #0

## 2017-06-10 MED FILL — oxyCODONE HCL 15 MG TABS: 15 | 30 days supply | Qty: 120 | Fill #0

## 2017-06-10 NOTE — Assessment & Plan Note (Signed)
Coreg Furosemide

## 2017-06-10 NOTE — Assessment & Plan Note (Signed)
Cont w/Lorazepam prn  Potential benefits of a long term benzodiazepines  use as well as potential risks  and complications were explained to the patient and were aknowledged. 

## 2017-06-10 NOTE — Assessment & Plan Note (Signed)
Knees and low back Oxy ER/percocet  Potential benefits of a long term opioids use as well as potential risks (i.e. addiction risk, apnea etc) and complications (i.e. Somnolence, constipation and others) were explained to the patient and were aknowledged.

## 2017-06-10 NOTE — Patient Instructions (Signed)
MC well w/Jill 

## 2017-06-10 NOTE — Assessment & Plan Note (Signed)
Worse: furosemide - increase dose prn

## 2017-06-10 NOTE — Assessment & Plan Note (Signed)
Glimeperide, Metformin, Actos Labs

## 2017-06-10 NOTE — Progress Notes (Signed)
Subjective:  Patient ID: [name], female    DOB: Jul 02, 1951  Age: 66 y.o. MRN: [id number]  CC: No chief complaint on file.   HPI [name] presents for LBP/OA, DM, CRF, HTN f/u  Outpatient Medications Prior to Visit  Medication Sig Dispense Refill  . amoxicillin (AMOXIL) 500 MG capsule TAKE 4 CAPSULES BY MOUTH 1 HOUR PRIOR TO DENTAL PROCEDURE/CLEANING 16 capsule 1  . buPROPion (WELLBUTRIN XL) 150 MG 24 hr tablet TAKE 1 TABLET BY MOUTH TWICE DAILY 180 tablet 3  . carvedilol (COREG) 25 MG tablet TAKE 1 TABLET BY MOUTH 2 TIMES DAILY WITH A MEAL. 180 tablet 2  . cyclobenzaprine (FLEXERIL) 10 MG tablet TAKE 3 TABLETS (30 MG TOTAL) BY MOUTH AT BEDTIME. 270 tablet 0  . DULoxetine (CYMBALTA) 60 MG capsule TAKE 1 CAPSULE (60 MG TOTAL) BY MOUTH AT BEDTIME. 90 capsule 2  . furosemide (LASIX) 40 MG tablet TAKE 1/2 TO 1 TABLET BY MOUTH ONCE DAILY AS NEEDED FOR SWELLING 90 tablet 3  . glimepiride (AMARYL) 1 MG tablet TAKE 1 TABLET BY MOUTH DAILY BEFORE BREAKFAST. 90 tablet 3  . glucose blood (ONE TOUCH TEST STRIPS) test strip Use as instructed 50 each 3  . Lancets (ONETOUCH ULTRASOFT) lancets Use as instructed 100 each 3  . lidocaine (LIDODERM) 5 % PLACE 1-2 PATCHES ONTO THE SKIN AS DIRECTED 60 patch 4  . lidocaine (XYLOCAINE) 5 % ointment Apply topically 3 (three) times daily as needed. 50 g 1  . LORazepam (ATIVAN) 2 MG tablet Take 0.5 tablets (1 mg total) by mouth every 6 (six) hours as needed for anxiety. 60 tablet 3  . LUTEIN PO Take 1 tablet by mouth at bedtime.    . metFORMIN (GLUCOPHAGE) 1000 MG tablet TAKE 1 TABLET BY MOUTH 2 TIMES DAILY WITH A MEAL. 180 tablet 2  . Multiple Vitamin (MULTIVITAMIN WITH MINERALS) TABS tablet Take 1 tablet by mouth daily.    . Omega-3 300 MG CAPS Take 300 mg by mouth at bedtime.    Marland Kitchen oxyCODONE (ROXICODONE) 15 MG immediate release tablet Take 1 tablet (15 mg total) by mouth every 6 (six) hours as needed for pain. 120 tablet 0  . OxyCODONE ER (XTAMPZA  ER) 18 MG C12A Take 18 mg by mouth 2 (two) times daily. 60 each 0  . pantoprazole (PROTONIX) 40 MG tablet TAKE 1 TABLET BY MOUTH DAILY 90 tablet 2  . pilocarpine (SALAGEN) 5 MG tablet Take 1 tablet (5 mg total) by mouth 2 (two) times daily. 60 tablet 5  . pioglitazone (ACTOS) 15 MG tablet TAKE 1 TABLET BY MOUTH ONCE DAILY 90 tablet 3  . polyvinyl alcohol (LIQUIFILM TEARS) 1.4 % ophthalmic solution Place 1 drop into both eyes daily as needed for dry eyes.    . PSYLLIUM PO Take 1 capsule by mouth 2 (two) times daily.    . Vitamin D, Cholecalciferol, 1000 UNITS TABS Take 1,000 Units by mouth daily.    Marland Kitchen oxybutynin (DITROPAN-XL) 5 MG 24 hr tablet Take 1 tablet (5 mg total) by mouth at bedtime. 90 tablet 3   No facility-administered medications prior to visit.     ROS Review of Systems  Constitutional: Positive for fatigue. Negative for activity change, appetite change, chills and unexpected weight change.  HENT: Negative for congestion, mouth sores and sinus pressure.   Eyes: Negative for visual disturbance.  Respiratory: Negative for cough and chest tightness.   Cardiovascular: Positive for leg swelling.  Gastrointestinal: Negative for abdominal  pain and nausea.  Genitourinary: Negative for difficulty urinating, frequency and vaginal pain.  Musculoskeletal: Positive for arthralgias, back pain and gait problem.  Skin: Negative for pallor and rash.  Neurological: Negative for dizziness, tremors, weakness, numbness and headaches.  Psychiatric/Behavioral: Positive for dysphoric mood. Negative for confusion, decreased concentration and sleep disturbance. The patient is nervous/anxious.     Objective:  BP 138/84 (BP Location: Left Arm, Patient Position: Sitting, Cuff Size: Normal)   Pulse 79   Temp 98.2 F (36.8 C) (Oral)   Ht 5\' 3"  (1.6 m)   Wt 259 lb (117.5 kg)   SpO2 98%   BMI 45.88 kg/m   BP Readings from Last 3 Encounters:  06/10/17 138/84  02/18/17 136/72  02/17/17 106/79     Wt Readings from Last 3 Encounters:  06/10/17 259 lb (117.5 kg)  02/18/17 252 lb 8 oz (114.5 kg)  02/17/17 250 lb (113.4 kg)    Physical Exam  Constitutional: She appears well-developed. No distress.  HENT:  Head: Normocephalic.  Right Ear: External ear normal.  Left Ear: External ear normal.  Nose: Nose normal.  Mouth/Throat: Oropharynx is clear and moist.  Eyes: Conjunctivae are normal. Pupils are equal, round, and reactive to light. Right eye exhibits no discharge. Left eye exhibits no discharge.  Neck: Normal range of motion. Neck supple. No JVD present. No tracheal deviation present. No thyromegaly present.  Cardiovascular: Normal rate, regular rhythm and normal heart sounds.   Pulmonary/Chest: No stridor. No respiratory distress. She has no wheezes.  Abdominal: Soft. Bowel sounds are normal. She exhibits no distension and no mass. There is no tenderness. There is no rebound and no guarding.  Musculoskeletal: She exhibits edema and tenderness.  Lymphadenopathy:    She has no cervical adenopathy.  Neurological: She displays normal reflexes. No cranial nerve deficit. She exhibits normal muscle tone. Coordination abnormal.  Skin: No rash noted. No erythema.  Psychiatric: Her behavior is normal. Judgment and thought content normal.  trace edema B ankles LS, knees tender w/ROM Obese  Lab Results  Component Value Date   WBC 10.7 (H) 04/28/2016   HGB 10.4 (L) 04/28/2016   HCT 31.6 (L) 04/28/2016   PLT 251.0 04/28/2016   GLUCOSE 115 (H) 02/18/2017   CHOL 215 (H) 09/25/2014   TRIG 154.0 (H) 09/25/2014   HDL 46.00 09/25/2014   LDLCALC 138 (H) 09/25/2014   ALT 10 08/20/2016   AST 12 08/20/2016   NA 134 (L) 02/18/2017   K 4.4 02/18/2017   CL 96 02/18/2017   CREATININE 1.81 (H) 02/18/2017   BUN 42 (H) 02/18/2017   CO2 29 02/18/2017   TSH 1.64 07/25/2013   INR 1.04 02/26/2015   HGBA1C 6.6 (H) 02/18/2017    No results found.  Assessment & Plan:   There are no  diagnoses linked to this encounter. I have discontinued [name]'s oxybutynin. I am also having her maintain her onetouch ultrasoft, glucose blood, polyvinyl alcohol, Vitamin D (Cholecalciferol), LUTEIN PO, multivitamin with minerals, Omega-3, PSYLLIUM PO, lidocaine, buPROPion, glimepiride, furosemide, lidocaine, pantoprazole, OxyCODONE ER, oxyCODONE, LORazepam, amoxicillin, pioglitazone, cyclobenzaprine, metFORMIN, carvedilol, DULoxetine, pilocarpine, and MYRBETRIQ.  Meds ordered this encounter  Medications  . MYRBETRIQ 50 MG TB24 tablet    Sig: Take 50 mg by mouth daily.    Refill:  0     Follow-up: No Follow-up on file.  [name], MD

## 2017-06-17 ENCOUNTER — Telehealth: Payer: Self-pay | Admitting: Internal Medicine

## 2017-06-17 NOTE — Telephone Encounter (Signed)
Called pt on 06/17/2017 regarding AWV scheduled for 06/21/2017. Informed pt that she is eligible for Welcome to Medicare Visit because she started receiving Medicare in Feb 2018. Pt stated that she would like to keep her AWV because she wanted to speak with the nurse regarding her medications. Informed pt that her ins may not cover the visit because she is still within the 12 month period to receive Welcome to Medicare. Also informed pt that ins would be checked to see if AWV would be covered since it was scheduled too early. Per [employer] will not cover AWV if scheduled too early. Attempted to call pt again to inform her of her about ins coverage, but phone was busy. Will try to call patient again on 06/20/2017.

## 2017-06-20 ENCOUNTER — Telehealth: Payer: Self-pay | Admitting: Internal Medicine

## 2017-06-20 NOTE — Telephone Encounter (Signed)
Spoke with pt regarding awv schedule for 06/21/2017. Informed pt that [facility] will not cover AWV schedule too early and that appt can be cx. Pt stated that she was okay with appt being cx for 06/21/2017 and that she will schedule AWV the next time she comes to the office.

## 2017-06-21 ENCOUNTER — Ambulatory Visit: Payer: Medicare Other

## 2017-06-27 MED FILL — FUROSEMIDE 40 MG TABLET: 40 | 30 days supply | Qty: 30 | Fill #5

## 2017-06-27 MED FILL — PILOCARPINE HCL 5 MG TABLET: 5 | 30 days supply | Qty: 60 | Fill #1

## 2017-06-28 DIAGNOSIS — H5212 Myopia, left eye: Secondary | ICD-10-CM | POA: Diagnosis not present

## 2017-06-28 DIAGNOSIS — H52223 Regular astigmatism, bilateral: Secondary | ICD-10-CM | POA: Diagnosis not present

## 2017-06-28 DIAGNOSIS — E119 Type 2 diabetes mellitus without complications: Secondary | ICD-10-CM | POA: Diagnosis not present

## 2017-06-28 DIAGNOSIS — H524 Presbyopia: Secondary | ICD-10-CM | POA: Diagnosis not present

## 2017-07-05 MED FILL — LORazepam 2 MG TABS: 2 | 30 days supply | Qty: 60 | Fill #3

## 2017-07-12 MED FILL — XTAMPZA ER 18 MG C12A: 18 | 30 days supply | Qty: 60 | Fill #0

## 2017-07-12 MED FILL — oxyCODONE HCL 15 MG TABS: 15 | 30 days supply | Qty: 120 | Fill #0

## 2017-07-15 DIAGNOSIS — M76822 Posterior tibial tendinitis, left leg: Secondary | ICD-10-CM | POA: Diagnosis not present

## 2017-07-15 DIAGNOSIS — M25572 Pain in left ankle and joints of left foot: Secondary | ICD-10-CM | POA: Diagnosis not present

## 2017-07-15 DIAGNOSIS — G8929 Other chronic pain: Secondary | ICD-10-CM | POA: Diagnosis not present

## 2017-07-22 DIAGNOSIS — G8929 Other chronic pain: Secondary | ICD-10-CM | POA: Diagnosis not present

## 2017-07-22 DIAGNOSIS — M25572 Pain in left ankle and joints of left foot: Secondary | ICD-10-CM | POA: Diagnosis not present

## 2017-07-25 MED FILL — PANTOPRAZOLE SOD DR 40 MG T: 40 | 90 days supply | Qty: 90 | Fill #2

## 2017-07-25 MED FILL — metFORMIN HCL 1000 MG TABS: 1000 | 90 days supply | Qty: 180 | Fill #1

## 2017-07-25 MED FILL — DULoxetine HCL 60 MG CPEP: 60 | 90 days supply | Qty: 90 | Fill #1

## 2017-07-29 DIAGNOSIS — M7989 Other specified soft tissue disorders: Secondary | ICD-10-CM | POA: Diagnosis not present

## 2017-07-29 DIAGNOSIS — M25572 Pain in left ankle and joints of left foot: Secondary | ICD-10-CM | POA: Diagnosis not present

## 2017-07-29 DIAGNOSIS — M76822 Posterior tibial tendinitis, left leg: Secondary | ICD-10-CM | POA: Diagnosis not present

## 2017-07-29 DIAGNOSIS — G8929 Other chronic pain: Secondary | ICD-10-CM | POA: Diagnosis not present

## 2017-08-01 ENCOUNTER — Other Ambulatory Visit: Payer: Self-pay | Admitting: Internal Medicine

## 2017-08-01 MED FILL — CYCLOBENZAPRINE 10 MG TAB: 10 | 90 days supply | Qty: 270 | Fill #0

## 2017-08-01 MED FILL — PILOCARPINE HCL 5 MG TABLET: 5 | 30 days supply | Qty: 60 | Fill #2

## 2017-08-02 NOTE — Telephone Encounter (Signed)
Routing to dr plotnikov, please advise, thanks 

## 2017-08-04 MED FILL — LORazepam 2 MG TABS: 2 | 30 days supply | Qty: 60 | Fill #0

## 2017-08-04 MED FILL — FUROSEMIDE 40 MG TABLET: 40 | 90 days supply | Qty: 180 | Fill #0

## 2017-08-11 MED FILL — MYRBETRIQ ER 50 MG TABLET: 50 | 90 days supply | Qty: 90 | Fill #0

## 2017-08-11 MED FILL — VESIcare 5 MG TABS: 5 | 90 days supply | Qty: 90 | Fill #0

## 2017-08-12 DIAGNOSIS — M76822 Posterior tibial tendinitis, left leg: Secondary | ICD-10-CM | POA: Diagnosis not present

## 2017-08-12 MED FILL — oxyCODONE HCL 15 MG TABS: 15 | 30 days supply | Qty: 120 | Fill #0

## 2017-08-12 MED FILL — GLIMEPIRIDE 1 MG TABLET: 1 | 90 days supply | Qty: 90 | Fill #0

## 2017-08-12 MED FILL — XTAMPZA ER 18 MG C12A: 18 | 30 days supply | Qty: 60 | Fill #0

## 2017-08-17 DIAGNOSIS — M76822 Posterior tibial tendinitis, left leg: Secondary | ICD-10-CM | POA: Diagnosis not present

## 2017-08-19 DIAGNOSIS — M76822 Posterior tibial tendinitis, left leg: Secondary | ICD-10-CM | POA: Diagnosis not present

## 2017-09-08 ENCOUNTER — Encounter: Payer: Self-pay | Admitting: Endocrinology

## 2017-09-08 ENCOUNTER — Ambulatory Visit (INDEPENDENT_AMBULATORY_CARE_PROVIDER_SITE_OTHER): Payer: Medicare Other | Admitting: Endocrinology

## 2017-09-08 VITALS — BP 130/86 | HR 88 | Ht 63.0 in | Wt 255.6 lb

## 2017-09-08 DIAGNOSIS — M545 Low back pain, unspecified: Secondary | ICD-10-CM

## 2017-09-08 DIAGNOSIS — G8929 Other chronic pain: Secondary | ICD-10-CM | POA: Diagnosis not present

## 2017-09-08 NOTE — Patient Instructions (Addendum)
Stop Metformin  Check with [facility] about Vitamin D

## 2017-09-08 NOTE — Progress Notes (Signed)
Patient ID: [name], female   DOB: 03-Jan-1951, 66 y.o.   MRN: [id number]          Referring Physician: Gaetano Net   Chief complaint: Back problem  History of Present Illness:  PROBLEM 1:  The patient is referred here for abnormal findings on bone density testing in March 2018  She had a screening bone density done and this showed the following T-scores: Femoral neck: -0.9 Spine: + 2.7 Forearm score: + 0.2  She has no history of low trauma fracture or height loss  Age at menopause: 72's  Previous treatment: She had been on HRT with estradiol and medroxyprogesterone after menopause because of hot flashes until about 2017 Calcium supplements: None Vitamin D supplements: 1000 units of D3   Since her bone density was unusually high at the lumbar spine she has been referred here for evaluation of possible compression fractures by her gynecologist  Currently the patient has only mild intermittent back pain based on her activity level which she has had for some time She thinks that in the 1980s she did have an episode of back pain when lifting a patient but does not think she had a known fracture or even an x-ray that time Has had no other trauma to her back  MRI of her lumbar spine from 2017 shows only degenerative disc disease  LABS:   No results found for: VD25OH  PROBLEM 2:  DIABETES type  2  She has had diabetes for several years and is currently on a regimen of Actos, metformin and Amaryl Her A1c has been variable although not always below 7% except recently More recently she has been trying to be better with her diet and blood sugars do better She has been continued on metformin despite her renal function abnormality Her creatinine clearances have been below 30 consistently for several months now  Lab Results  Component Value Date   HGBA1C 6.5 06/10/2017   HGBA1C 6.6 (H) 02/18/2017   HGBA1C 6.6 (H) 11/19/2016   Lab Results  Component Value Date   LDLCALC 138  (H) 09/25/2014   CREATININE 2.02 (H) 06/10/2017      Past Medical History:  Diagnosis Date  . Anemia   . Anxiety   . Colitis 2010   microscopic- Dr Henrene Pastor  . Depression   . Diabetes mellitus    type II  . GERD (gastroesophageal reflux disease)   . Headache   . Hypertension   . IBS (irritable bowel syndrome)   . LBP (low back pain)   . Neuropathy    feet bilat   . Osteoarthritis   . Osteopenia   . Pneumonia    hx of 2014   . Renal insufficiency 2011  . Sinusitis    currently being treated with antibiotic will complete 03/04/2015    Past Surgical History:  Procedure Laterality Date  . foramen magnum ependymoma surgery  2003   Dr Rita Ohara  . JOINT REPLACEMENT Bilateral   . NASAL SINUS SURGERY     1973   . TONSILLECTOMY    . TOTAL KNEE ARTHROPLASTY     L 2008, R 2009, R 2016- Dr Maureen Ralphs  . TOTAL KNEE REVISION Right 03/05/2015   Procedure: RIGHT TOTAL KNEE ARTHROPLASTY REVISION;  Surgeon: Gaynelle Arabian, MD;  Location: [facility];  Service: Orthopedics;  Laterality: Right;    Family History  Problem Relation Age of Onset  . Depression Mother   . Hypertension Mother   . Stroke Mother 84  .  Diabetes Mother   . Diabetes Father   . Hyperlipidemia Father   . Hypertension Father   . Crohn's disease Maternal Uncle   . Diabetes Other   . Cancer Brother        Prostate  . Diabetes Brother   . Heart disease Brother   . Hyperlipidemia Brother   . Hypertension Brother   . Diabetes Brother   . Heart disease Brother        Heart Disease before age 69  . Heart attack Brother   . Stroke Brother        X's 2  . Hyperlipidemia Brother   . Hypertension Brother   . Colon cancer Neg Hx     Social History:  reports that she quit smoking about 28 years ago. Her smoking use included Cigarettes. She has a 20.00 pack-year smoking history. She has never used smokeless tobacco. She reports that she drinks alcohol. She reports that she does not use drugs.  Allergies:  Allergies    Allergen Reactions  . Erythromycin Diarrhea and Nausea And Vomiting    Allergies as of 09/08/2017      Reactions   Erythromycin Diarrhea, Nausea And Vomiting      Medication List       Accurate as of 09/08/17  2:06 PM. Always use your most recent med list.          amoxicillin 500 MG capsule Commonly known as:  AMOXIL TAKE 4 CAPSULES BY MOUTH 1 HOUR PRIOR TO DENTAL PROCEDURE/CLEANING   buPROPion 150 MG 24 hr tablet Commonly known as:  WELLBUTRIN XL Take 1 tablet (150 mg total) by mouth 2 (two) times daily.   carvedilol 25 MG tablet Commonly known as:  COREG TAKE 1 TABLET BY MOUTH 2 TIMES DAILY WITH A MEAL.   cyclobenzaprine 10 MG tablet Commonly known as:  FLEXERIL Take 3 tablets (30 mg total) by mouth at bedtime.   cyclobenzaprine 10 MG tablet Commonly known as:  FLEXERIL TAKE 3 TABLETS BY MOUTH AT BEDTIME   DULoxetine 60 MG capsule Commonly known as:  CYMBALTA TAKE 1 CAPSULE (60 MG TOTAL) BY MOUTH AT BEDTIME.   furosemide 40 MG tablet Commonly known as:  LASIX Take 1-2 tablets (40-80 mg total) by mouth daily.   glimepiride 1 MG tablet Commonly known as:  AMARYL TAKE 1 TABLET BY MOUTH DAILY BEFORE BREAKFAST.   glucose blood test strip Commonly known as:  ONE TOUCH TEST STRIPS Use as instructed   lidocaine 5 % Commonly known as:  LIDODERM PLACE 1-2 PATCHES ONTO THE SKIN AS DIRECTED   lidocaine 5 % ointment Commonly known as:  XYLOCAINE Apply topically 3 (three) times daily as needed.   LORazepam 2 MG tablet Commonly known as:  ATIVAN Take 0.5 tablets (1 mg total) by mouth every 6 (six) hours as needed for anxiety.   LUTEIN PO Take 1 tablet by mouth at bedtime.   metFORMIN 1000 MG tablet Commonly known as:  GLUCOPHAGE TAKE 1 TABLET BY MOUTH 2 TIMES DAILY WITH A MEAL.   multivitamin with minerals Tabs tablet Take 1 tablet by mouth daily.   MYRBETRIQ 50 MG Tb24 tablet Generic drug:  mirabegron ER Take 50 mg by mouth daily.   Omega-3 300 MG  Caps Take 300 mg by mouth at bedtime.   onetouch ultrasoft lancets Use as instructed   oxyCODONE 15 MG immediate release tablet Commonly known as:  ROXICODONE Take 1 tablet (15 mg total) by mouth every 6 (six) hours  as needed for pain.   OxyCODONE ER 18 MG C12a Commonly known as:  XTAMPZA ER Take 18 mg by mouth 2 (two) times daily.   pantoprazole 40 MG tablet Commonly known as:  PROTONIX TAKE 1 TABLET BY MOUTH DAILY   pilocarpine 5 MG tablet Commonly known as:  SALAGEN Take 1 tablet (5 mg total) by mouth 2 (two) times daily.   pioglitazone 15 MG tablet Commonly known as:  ACTOS TAKE 1 TABLET BY MOUTH ONCE DAILY   polyvinyl alcohol 1.4 % ophthalmic solution Commonly known as:  LIQUIFILM TEARS Place 1 drop into both eyes daily as needed for dry eyes.   PSYLLIUM PO Take 1 capsule by mouth 2 (two) times daily.   VESICARE 5 MG tablet Generic drug:  solifenacin Take 5 mg by mouth daily.   Vitamin D (Cholecalciferol) 1000 units Tabs Take 1,000 Units by mouth daily.        Review of Systems  Constitutional: Negative for weight loss.  HENT: Negative for headaches.   Respiratory: Negative for shortness of breath.   Cardiovascular: Positive for leg swelling.  Gastrointestinal: Negative for abdominal pain.  Endocrine: Negative for fatigue and polydipsia.  Genitourinary: Negative for nocturia.  Musculoskeletal: Positive for joint pain and back pain.       She has pain in her feet especially the left, has difficulty walking.  Previously had a stress fracture  Skin: Negative for rash.  Neurological: Negative for numbness.     LABS:  No visits with results within 1 Week(s) from this visit.  Latest known visit with results is:  Appointment on 06/10/2017  Component Date Value Ref Range Status  . Hgb A1c MFr Bld 06/10/2017 6.5  4.6 - 6.5 % Final   Glycemic Control Guidelines for People with Diabetes:Non Diabetic:  <6%Goal of Therapy: <7%Additional Action Suggested:  >8%    . Sodium 06/10/2017 136  135 - 145 mEq/L Final  . Potassium 06/10/2017 4.0  3.5 - 5.1 mEq/L Final  . Chloride 06/10/2017 96  96 - 112 mEq/L Final  . CO2 06/10/2017 31  19 - 32 mEq/L Final  . Glucose, Bld 06/10/2017 98  70 - 99 mg/dL Final  . BUN 06/10/2017 35* 6 - 23 mg/dL Final  . Creatinine, Ser 06/10/2017 2.02* 0.40 - 1.20 mg/dL Final  . Calcium 06/10/2017 9.5  8.4 - 10.5 mg/dL Final  . GFR 06/10/2017 26.25* >60.00 mL/min Final     PHYSICAL EXAM:  BP 130/86   Pulse 88   Ht 5\' 3"  (1.6 m)   Wt 255 lb 9.6 oz (115.9 kg)   SpO2 94%   BMI 45.28 kg/m   GENERAL: Large built with marked generalized obesity   No pallor, clubbing, cervical lymphadenopathy or edema.    Skin:  no rash or pigmentation.  EYES:  Externally normal.    ENT: Exam not indicated  THYROID:  Not palpable.  HEART:  Normal  S1 and S2; no murmur or click.  CHEST:  Normal shape.  Lungs: Vescicular breath sounds heard equally.  No crepitations/ wheeze.  ABDOMEN:  Exam not indicated  JOINTS:  Mild osteoarthritic changes in the fingers.  SPINE:She has increased lumbar lordosis but no tenderness, prominent spine or deformity Has mild thoracic kyphosis without tenderness   NEUROLOGICAL: .Reflexes are normal bilaterally at ankles and biceps. She has significant only decreased monofilament sensation on her toes   ASSESSMENT:   1.  The patient does not have osteoporosis and does not need any further evaluation.  Although she has a relatively high bone density at the lumbar spine and this may be related to degenerative disc disease and spur formation rather than compression fractures of which she has no history and no evidence on her last MRI of 2007 2.  Diabetes with obesity and A1c around 6.5 recently Probable complication of neuropathy  Although she is doing well with her control she is on full dose metformin despite her creatinine clearance being under 30 consistently and no signs of improvement in her  renal function   3.  Chronic kidney disease partly related to use of nonsteroidal agents in the past and other unclear factors Her last creatinine clearance was below 30 Apparently she is due to make a follow-up appointment with her nephrologist   PLAN:    She will have a lumbar spine x-ray done to rule out any osseous abnormality  She was asked to stop metformin because of her creatinine clearance below 30 and discuss alternative treatments with her PCP  She will have her follow-up with nephrologist soon and needs to discuss related issues including secondary hyperparathyroidism which normally have been evaluated and found to be not significant in the past   Consultation note sent to referring physician   [facility] 09/08/2017, 2:06 PM

## 2017-09-09 ENCOUNTER — Ambulatory Visit: Payer: Medicare Other | Admitting: Internal Medicine

## 2017-09-12 ENCOUNTER — Ambulatory Visit: Payer: Medicare Other | Admitting: Internal Medicine

## 2017-09-19 ENCOUNTER — Ambulatory Visit (INDEPENDENT_AMBULATORY_CARE_PROVIDER_SITE_OTHER): Payer: Medicare Other | Admitting: Internal Medicine

## 2017-09-19 ENCOUNTER — Encounter: Payer: Self-pay | Admitting: Internal Medicine

## 2017-09-19 ENCOUNTER — Other Ambulatory Visit (INDEPENDENT_AMBULATORY_CARE_PROVIDER_SITE_OTHER): Payer: Medicare Other

## 2017-09-19 DIAGNOSIS — E119 Type 2 diabetes mellitus without complications: Secondary | ICD-10-CM

## 2017-09-19 DIAGNOSIS — F332 Major depressive disorder, recurrent severe without psychotic features: Secondary | ICD-10-CM

## 2017-09-19 DIAGNOSIS — F411 Generalized anxiety disorder: Secondary | ICD-10-CM

## 2017-09-19 DIAGNOSIS — Z23 Encounter for immunization: Secondary | ICD-10-CM | POA: Diagnosis not present

## 2017-09-19 DIAGNOSIS — T84012D Broken internal right knee prosthesis, subsequent encounter: Secondary | ICD-10-CM | POA: Diagnosis not present

## 2017-09-19 DIAGNOSIS — M545 Low back pain, unspecified: Secondary | ICD-10-CM

## 2017-09-19 DIAGNOSIS — G8929 Other chronic pain: Secondary | ICD-10-CM

## 2017-09-19 DIAGNOSIS — I10 Essential (primary) hypertension: Secondary | ICD-10-CM

## 2017-09-19 LAB — HEMOGLOBIN A1C: Hgb A1c MFr Bld: 6.8 % — ABNORMAL HIGH (ref 4.6–6.5)

## 2017-09-19 MED ORDER — OXYCODONE HCL 15 MG PO TABS: 15.0000 mg | ORAL_TABLET | Freq: Four times a day (QID) | ORAL | 0 refills | Status: DC | PRN

## 2017-09-19 MED ORDER — OXYCODONE ER 18 MG PO C12A: 18.0000 mg | EXTENDED_RELEASE_CAPSULE | Freq: Two times a day (BID) | ORAL | 0 refills | Status: DC

## 2017-09-19 MED ORDER — GLUCOSE BLOOD VI STRP: ORAL_STRIP | 3 refills | Status: DC

## 2017-09-19 MED ORDER — BUPROPION HCL ER (XL) 150 MG PO TB24: 150.0000 mg | ORAL_TABLET | Freq: Two times a day (BID) | ORAL | 3 refills | Status: DC

## 2017-09-19 MED ORDER — LIDOCAINE 5 % EX PTCH: 1.0000 | MEDICATED_PATCH | CUTANEOUS | 4 refills | Status: DC

## 2017-09-19 MED ORDER — CYCLOBENZAPRINE HCL 10 MG PO TABS: 30.0000 mg | ORAL_TABLET | Freq: Every day | ORAL | 0 refills | Status: DC

## 2017-09-19 MED FILL — oxyCODONE HCL 15 MG TABS: 15 | 30 days supply | Qty: 120 | Fill #0

## 2017-09-19 MED FILL — XTAMPZA ER 18 MG C12A: 18 | 30 days supply | Qty: 60 | Fill #0

## 2017-09-19 MED FILL — BUPROPION HCL XL 150 MG TAB: 150 | 90 days supply | Qty: 180 | Fill #0

## 2017-09-19 MED FILL — PILOCARPINE HCL 5 MG TABLET: 5 | 30 days supply | Qty: 60 | Fill #3

## 2017-09-19 MED FILL — PIOGLITAZONE HCL 15 MG TAB: 15 | 90 days supply | Qty: 90 | Fill #2

## 2017-09-19 NOTE — Assessment & Plan Note (Signed)
On Oxy

## 2017-09-19 NOTE — Assessment & Plan Note (Addendum)
Glimeperide, Metformin -d/c, Actos

## 2017-09-19 NOTE — Assessment & Plan Note (Signed)
Lorazepam prn  Potential benefits of a long term benzodiazepines  use as well as potential risks  and complications were explained to the patient and were aknowledged.  

## 2017-09-19 NOTE — Assessment & Plan Note (Signed)
Rx given

## 2017-09-19 NOTE — Assessment & Plan Note (Signed)
On Wellbutrin

## 2017-09-19 NOTE — Assessment & Plan Note (Signed)
On Coreg 

## 2017-09-19 NOTE — Progress Notes (Signed)
Subjective:  Patient ID: [name], female    DOB: 1951/01/16  Age: 66 y.o. MRN: [id number]  CC: No chief complaint on file.   HPI [name] presents for LBP, DM, HTN C/o L Foot pain, had an MRI w/Dr Doran Durand. Dr Dwyane Dee stopped Metformin  Outpatient Medications Prior to Visit  Medication Sig Dispense Refill  . amoxicillin (AMOXIL) 500 MG capsule TAKE 4 CAPSULES BY MOUTH 1 HOUR PRIOR TO DENTAL PROCEDURE/CLEANING 16 capsule 1  . buPROPion (WELLBUTRIN XL) 150 MG 24 hr tablet Take 1 tablet (150 mg total) by mouth 2 (two) times daily. 180 tablet 3  . carvedilol (COREG) 25 MG tablet TAKE 1 TABLET BY MOUTH 2 TIMES DAILY WITH A MEAL. 180 tablet 2  . cyclobenzaprine (FLEXERIL) 10 MG tablet Take 3 tablets (30 mg total) by mouth at bedtime. 270 tablet 0  . cyclobenzaprine (FLEXERIL) 10 MG tablet TAKE 3 TABLETS BY MOUTH AT BEDTIME 270 tablet 0  . DULoxetine (CYMBALTA) 60 MG capsule TAKE 1 CAPSULE (60 MG TOTAL) BY MOUTH AT BEDTIME. 90 capsule 2  . furosemide (LASIX) 40 MG tablet Take 1-2 tablets (40-80 mg total) by mouth daily. 180 tablet 2  . glimepiride (AMARYL) 1 MG tablet TAKE 1 TABLET BY MOUTH DAILY BEFORE BREAKFAST. 90 tablet 3  . glucose blood (ONE TOUCH TEST STRIPS) test strip Use as instructed 50 each 3  . Lancets (ONETOUCH ULTRASOFT) lancets Use as instructed 100 each 3  . lidocaine (LIDODERM) 5 % PLACE 1-2 PATCHES ONTO THE SKIN AS DIRECTED 60 patch 4  . lidocaine (XYLOCAINE) 5 % ointment Apply topically 3 (three) times daily as needed. 100 g 1  . LORazepam (ATIVAN) 2 MG tablet Take 0.5 tablets (1 mg total) by mouth every 6 (six) hours as needed for anxiety. 60 tablet 3  . LUTEIN PO Take 1 tablet by mouth at bedtime.    . metFORMIN (GLUCOPHAGE) 1000 MG tablet TAKE 1 TABLET BY MOUTH 2 TIMES DAILY WITH A MEAL. 180 tablet 2  . Multiple Vitamin (MULTIVITAMIN WITH MINERALS) TABS tablet Take 1 tablet by mouth daily.    Marland Kitchen MYRBETRIQ 50 MG TB24 tablet Take 50 mg by mouth daily.  0  . Omega-3  300 MG CAPS Take 300 mg by mouth at bedtime.    Marland Kitchen oxyCODONE (ROXICODONE) 15 MG immediate release tablet Take 1 tablet (15 mg total) by mouth every 6 (six) hours as needed for pain. 120 tablet 0  . OxyCODONE ER (XTAMPZA ER) 18 MG C12A Take 18 mg by mouth 2 (two) times daily. 60 each 0  . pantoprazole (PROTONIX) 40 MG tablet TAKE 1 TABLET BY MOUTH DAILY 90 tablet 2  . pilocarpine (SALAGEN) 5 MG tablet Take 1 tablet (5 mg total) by mouth 2 (two) times daily. 60 tablet 5  . pioglitazone (ACTOS) 15 MG tablet TAKE 1 TABLET BY MOUTH ONCE DAILY 90 tablet 3  . polyvinyl alcohol (LIQUIFILM TEARS) 1.4 % ophthalmic solution Place 1 drop into both eyes daily as needed for dry eyes.    . PSYLLIUM PO Take 1 capsule by mouth 2 (two) times daily.    . VESICARE 5 MG tablet Take 5 mg by mouth daily.  3  . Vitamin D, Cholecalciferol, 1000 UNITS TABS Take 1,000 Units by mouth daily.     No facility-administered medications prior to visit.     ROS Review of Systems  Constitutional: Negative for activity change, appetite change, chills, fatigue and unexpected weight change.  HENT: Negative  for congestion, mouth sores and sinus pressure.   Eyes: Negative for visual disturbance.  Respiratory: Negative for cough and chest tightness.   Gastrointestinal: Negative for abdominal pain and nausea.  Genitourinary: Negative for difficulty urinating, frequency and vaginal pain.  Musculoskeletal: Positive for arthralgias, back pain and gait problem.  Skin: Negative for pallor and rash.  Neurological: Negative for dizziness, tremors, weakness, numbness and headaches.  Hematological: Negative.   Psychiatric/Behavioral: Negative.  Negative for confusion, sleep disturbance and suicidal ideas.    Objective:  BP (!) 142/76   Pulse 68   Temp 97.7 F (36.5 C)   Wt 253 lb (114.8 kg)   SpO2 97%   BMI 44.82 kg/m   BP Readings from Last 3 Encounters:  09/19/17 (!) 142/76  09/08/17 130/86  06/10/17 138/84    Wt Readings  from Last 3 Encounters:  09/19/17 253 lb (114.8 kg)  09/08/17 255 lb 9.6 oz (115.9 kg)  06/10/17 259 lb (117.5 kg)    Physical Exam  Constitutional: She appears well-developed. No distress.  HENT:  Head: Normocephalic.  Right Ear: External ear normal.  Left Ear: External ear normal.  Nose: Nose normal.  Mouth/Throat: Oropharynx is clear and moist.  Eyes: Pupils are equal, round, and reactive to light. Conjunctivae are normal. Right eye exhibits no discharge. Left eye exhibits no discharge.  Neck: Normal range of motion. Neck supple. No JVD present. No tracheal deviation present. No thyromegaly present.  Cardiovascular: Normal rate, regular rhythm and normal heart sounds.   Pulmonary/Chest: No stridor. No respiratory distress. She has no wheezes.  Abdominal: Soft. Bowel sounds are normal. She exhibits no distension and no mass. There is no tenderness. There is no rebound and no guarding.  Musculoskeletal: She exhibits tenderness. She exhibits no edema.  Lymphadenopathy:    She has no cervical adenopathy.  Neurological: She displays normal reflexes. No cranial nerve deficit. She exhibits normal muscle tone. Coordination abnormal.  Skin: No rash noted. No erythema.  Psychiatric: She has a normal mood and affect. Her behavior is normal. Judgment and thought content normal.  LS back L foot is tender Obese  Lab Results  Component Value Date   WBC 10.7 (H) 04/28/2016   HGB 10.4 (L) 04/28/2016   HCT 31.6 (L) 04/28/2016   PLT 251.0 04/28/2016   GLUCOSE 98 06/10/2017   CHOL 215 (H) 09/25/2014   TRIG 154.0 (H) 09/25/2014   HDL 46.00 09/25/2014   LDLCALC 138 (H) 09/25/2014   ALT 10 08/20/2016   AST 12 08/20/2016   NA 136 06/10/2017   K 4.0 06/10/2017   CL 96 06/10/2017   CREATININE 2.02 (H) 06/10/2017   BUN 35 (H) 06/10/2017   CO2 31 06/10/2017   TSH 1.64 07/25/2013   INR 1.04 02/26/2015   HGBA1C 6.5 06/10/2017    No results found.  Assessment & Plan:   There are no  diagnoses linked to this encounter. I am having Ms. Menden maintain her onetouch ultrasoft, glucose blood, polyvinyl alcohol, Vitamin D (Cholecalciferol), LUTEIN PO, multivitamin with minerals, Omega-3, PSYLLIUM PO, lidocaine, pantoprazole, amoxicillin, pioglitazone, metFORMIN, carvedilol, DULoxetine, pilocarpine, MYRBETRIQ, furosemide, oxyCODONE, OxyCODONE ER, LORazepam, lidocaine, buPROPion, cyclobenzaprine, glimepiride, cyclobenzaprine, and VESICARE.  No orders of the defined types were placed in this encounter.    Follow-up: No Follow-up on file.  [name], MD

## 2017-09-20 LAB — BASIC METABOLIC PANEL
BUN: 43 mg/dL — ABNORMAL HIGH (ref 6–23)
CO2: 29 mEq/L (ref 19–32)
Calcium: 9.3 mg/dL (ref 8.4–10.5)
Chloride: 93 mEq/L — ABNORMAL LOW (ref 96–112)
Creatinine, Ser: 2.72 mg/dL — ABNORMAL HIGH (ref 0.40–1.20)
GFR: 18.6 mL/min — ABNORMAL LOW (ref >60.00)
Glucose, Bld: 175 mg/dL — ABNORMAL HIGH (ref 70–99)
Potassium: 4.6 mEq/L (ref 3.5–5.1)
Sodium: 133 mEq/L — ABNORMAL LOW (ref 135–145)

## 2017-09-21 ENCOUNTER — Telehealth: Payer: Self-pay

## 2017-09-21 NOTE — Telephone Encounter (Signed)
Key: [id number]

## 2017-09-26 NOTE — Telephone Encounter (Signed)
New key:   Key: [id number]

## 2017-09-28 DIAGNOSIS — M76822 Posterior tibial tendinitis, left leg: Secondary | ICD-10-CM | POA: Diagnosis not present

## 2017-10-03 NOTE — Telephone Encounter (Signed)
PA was denied.  Can you inform patient that she can still get these if she pays out of pocket

## 2017-10-03 NOTE — Telephone Encounter (Signed)
Patient has been informed. She would like the RX printed out to pick up.

## 2017-10-04 DIAGNOSIS — N189 Chronic kidney disease, unspecified: Secondary | ICD-10-CM | POA: Diagnosis not present

## 2017-10-04 DIAGNOSIS — I10 Essential (primary) hypertension: Secondary | ICD-10-CM | POA: Diagnosis not present

## 2017-10-04 DIAGNOSIS — D631 Anemia in chronic kidney disease: Secondary | ICD-10-CM | POA: Diagnosis not present

## 2017-10-04 DIAGNOSIS — E1129 Type 2 diabetes mellitus with other diabetic kidney complication: Secondary | ICD-10-CM | POA: Diagnosis not present

## 2017-10-04 DIAGNOSIS — N2581 Secondary hyperparathyroidism of renal origin: Secondary | ICD-10-CM | POA: Diagnosis not present

## 2017-10-08 ENCOUNTER — Telehealth: Payer: Self-pay | Admitting: Internal Medicine

## 2017-10-08 NOTE — Telephone Encounter (Signed)
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  Her pharmacy not open.  We have decided to try samples of Uceris to see if that helps.  Note did take prophylactic amoxicillin in Sept prior to dental work

## 2017-10-12 ENCOUNTER — Other Ambulatory Visit: Payer: Self-pay

## 2017-10-12 DIAGNOSIS — R197 Diarrhea, unspecified: Secondary | ICD-10-CM

## 2017-10-12 MED ORDER — BUDESONIDE ER 9 MG PO TB24: 9.0000 mg | ORAL_TABLET | Freq: Every day | ORAL | 0 refills | Status: DC

## 2017-10-12 NOTE — Telephone Encounter (Signed)
Pt states she is still having soft stools and some abdominal discomfort. Order in epic for cdiff. Pt scheduled to see Nicoletta Ba PA tomorrow at 3pm, pt aware of appt.

## 2017-10-12 NOTE — Telephone Encounter (Signed)
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 She has had a history of Clostridium difficile and lymphocytic colitis. See Dr. Celesta Aver comments. She needs stool for Clostridium difficile and updated on her clinical condition. Finally, have her see an AP this week.

## 2017-10-13 ENCOUNTER — Encounter: Payer: Self-pay | Admitting: Physician Assistant

## 2017-10-13 ENCOUNTER — Ambulatory Visit (INDEPENDENT_AMBULATORY_CARE_PROVIDER_SITE_OTHER): Payer: Medicare Other | Admitting: Physician Assistant

## 2017-10-13 ENCOUNTER — Other Ambulatory Visit (INDEPENDENT_AMBULATORY_CARE_PROVIDER_SITE_OTHER): Payer: Medicare Other

## 2017-10-13 VITALS — BP 128/76 | HR 80 | Ht 63.0 in | Wt 252.0 lb

## 2017-10-13 DIAGNOSIS — N186 End stage renal disease: Secondary | ICD-10-CM | POA: Insufficient documentation

## 2017-10-13 DIAGNOSIS — R197 Diarrhea, unspecified: Secondary | ICD-10-CM | POA: Diagnosis not present

## 2017-10-13 DIAGNOSIS — R630 Anorexia: Secondary | ICD-10-CM | POA: Diagnosis not present

## 2017-10-13 DIAGNOSIS — N189 Chronic kidney disease, unspecified: Secondary | ICD-10-CM

## 2017-10-13 DIAGNOSIS — K52832 Lymphocytic colitis: Secondary | ICD-10-CM | POA: Diagnosis not present

## 2017-10-13 DIAGNOSIS — R1084 Generalized abdominal pain: Secondary | ICD-10-CM | POA: Diagnosis not present

## 2017-10-13 LAB — CBC WITH DIFFERENTIAL/PLATELET
Basophils Absolute: 0.1 10*3/uL (ref 0.0–0.1)
Basophils Relative: 0.8 % (ref 0.0–3.0)
Eosinophils Absolute: 0.5 10*3/uL (ref 0.0–0.7)
Eosinophils Relative: 5.3 % — ABNORMAL HIGH (ref 0.0–5.0)
HCT: 31.6 % — ABNORMAL LOW (ref 36.0–46.0)
Hemoglobin: 10.2 g/dL — ABNORMAL LOW (ref 12.0–15.0)
Lymphocytes Relative: 22 % (ref 12.0–46.0)
Lymphs Abs: 2 10*3/uL (ref 0.7–4.0)
MCHC: 32.5 g/dL (ref 30.0–36.0)
MCV: 94.9 fl (ref 78.0–100.0)
Monocytes Absolute: 0.5 10*3/uL (ref 0.1–1.0)
Monocytes Relative: 6 % (ref 3.0–12.0)
Neutro Abs: 5.8 10*3/uL (ref 1.4–7.7)
Neutrophils Relative %: 65.9 % (ref 43.0–77.0)
Platelets: 239 10*3/uL (ref 150.0–400.0)
RBC: 3.32 Mil/uL — ABNORMAL LOW (ref 3.87–5.11)
RDW: 12.4 % (ref 11.5–15.5)
WBC: 8.9 10*3/uL (ref 4.0–10.5)

## 2017-10-13 LAB — BASIC METABOLIC PANEL
BUN: 53 mg/dL — ABNORMAL HIGH (ref 6–23)
CO2: 29 mEq/L (ref 19–32)
Calcium: 9.4 mg/dL (ref 8.4–10.5)
Chloride: 100 mEq/L (ref 96–112)
Creatinine, Ser: 2.29 mg/dL — ABNORMAL HIGH (ref 0.40–1.20)
GFR: 22.68 mL/min — ABNORMAL LOW (ref >60.00)
Glucose, Bld: 165 mg/dL — ABNORMAL HIGH (ref 70–99)
Potassium: 3.8 mEq/L (ref 3.5–5.1)
Sodium: 136 mEq/L (ref 135–145)

## 2017-10-13 LAB — SEDIMENTATION RATE: Sed Rate: 78 mm/hr — ABNORMAL HIGH (ref 0–30)

## 2017-10-13 NOTE — Patient Instructions (Signed)
Please go to the basement level to have your labs drawn and stool studies.  We have given you samples of Uceris 9 mg. Take 1 tab every morning. We will call you with the lab and stool study results and decide later if we need to prescribe it.

## 2017-10-13 NOTE — Progress Notes (Signed)
Assessment and plans reviewed  

## 2017-10-13 NOTE — Progress Notes (Signed)
Subjective:    Patient ID: [name], female    DOB: [birth year], 66 y.o.   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 She comes in today with 1-1/2 week history of abdominal cramping diarrhea and malaise. She does have prior history of lymphocytic colitis which was diagnosed in 2010 and treated at that time with budesonide. Also with prior history of C. difficile colitis. She has hypertension, osteoarthritis, adult-onset diabetes mellitus, morbid obesity, chronic anxiety/depression, and chronic kidney disease. 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 Last colonoscopy February 2016 done for history of adenomatous polyps with finding of 23-5 mm polyps both removed. Path showed polypoid changes but no adenomatous changes, also noted mild diverticulosis. Patient took a trip to [place] in September 2018. After returning in early October she took 2 g of amoxicillin prior to a dental visit and then 2 g of amoxicillin a few days later after she had a cavity filled. She says she started having diarrhea last week, and initially was having at least 5-6 bowel movements per day, and also is getting up at night with diarrhea. She had 1 incontinent episode in the middle of the night. She's not had any melena or hematochezia. She complains of crampy rather generalized abdominal discomfort. She says her appetite has been poor over the past week and she just doesn't have much energy. No documented fever or chills. No nausea or vomiting. She was given samples of Uceris 9 mg daily about 6 days ago. She says over the past few days she has been doing better, she is not having liquid stool at this point but says the stools are still soft and have decreased in frequency. Says she gets anxious about her kidney disease and becoming dehydrated. Review of Systems Pertinent positive and negative review of systems were noted in the above  HPI section.  All other review of systems was otherwise negative.  Outpatient Encounter Prescriptions as of 10/13/2017  Medication Sig  . amoxicillin (AMOXIL) 500 MG capsule TAKE 4 CAPSULES BY MOUTH 1 HOUR PRIOR TO DENTAL PROCEDURE/CLEANING  . Budesonide ER (UCERIS) 9 MG TB24 Take 9 mg by mouth daily. Samples of this drug were given to the patient, quantity 24, Lot Number [id number]  . buPROPion (WELLBUTRIN XL) 150 MG 24 hr tablet Take 1 tablet (150 mg total) by mouth 2 (two) times daily.  . carvedilol (COREG) 25 MG tablet TAKE 1 TABLET BY MOUTH 2 TIMES DAILY WITH A MEAL. (Patient taking differently: TAKE 1/2 TABLET BY MOUTH 2 TIMES DAILY WITH A MEAL.)  . cyclobenzaprine (FLEXERIL) 10 MG tablet Take 3 tablets (30 mg total) by mouth at bedtime.  . DULoxetine (CYMBALTA) 60 MG capsule TAKE 1 CAPSULE (60 MG TOTAL) BY MOUTH AT BEDTIME.  . furosemide (LASIX) 40 MG tablet Take 1-2 tablets (40-80 mg total) by mouth daily.  Marland Kitchen glimepiride (AMARYL) 1 MG tablet TAKE 1 TABLET BY MOUTH DAILY BEFORE BREAKFAST.  Marland Kitchen glucose blood (ONE TOUCH TEST STRIPS) test strip Use as instructed  . Lancets (ONETOUCH ULTRASOFT) lancets Use as instructed  . lidocaine (LIDODERM) 5 % Place 1-2 patches onto the skin daily. Remove & Discard patch within 12 hours or as directed by MD  . lidocaine (XYLOCAINE) 5 % ointment Apply topically 3 (three) times daily as needed.  Marland Kitchen LORazepam (ATIVAN) 2 MG tablet Take 0.5 tablets (1 mg total) by mouth every 6 (six)  hours as needed for anxiety.  . Multiple Vitamin (MULTIVITAMIN WITH MINERALS) TABS tablet Take 1 tablet by mouth daily.  Marland Kitchen MYRBETRIQ 50 MG TB24 tablet Take 50 mg by mouth daily.  . Omega-3 300 MG CAPS Take 300 mg by mouth at bedtime.  Marland Kitchen oxyCODONE (ROXICODONE) 15 MG immediate release tablet Take 1 tablet (15 mg total) by mouth every 6 (six) hours as needed for pain.  . OxyCODONE ER (XTAMPZA ER) 18 MG C12A Take 1 tablet by mouth 2 (two) times daily.  . pantoprazole (PROTONIX) 40 MG tablet  TAKE 1 TABLET BY MOUTH DAILY  . pilocarpine (SALAGEN) 5 MG tablet Take 1 tablet (5 mg total) by mouth 2 (two) times daily.  . pioglitazone (ACTOS) 15 MG tablet TAKE 1 TABLET BY MOUTH ONCE DAILY  . polyvinyl alcohol (LIQUIFILM TEARS) 1.4 % ophthalmic solution Place 1 drop into both eyes daily as needed for dry eyes.  . PSYLLIUM PO Take 1 capsule by mouth 2 (two) times daily.  . VESICARE 5 MG tablet Take 5 mg by mouth daily.  . Vitamin D, Cholecalciferol, 1000 UNITS TABS Take 1,000 Units by mouth daily.  . [DISCONTINUED] LUTEIN PO Take 1 tablet by mouth at bedtime.  . [DISCONTINUED] OxyCODONE ER (XTAMPZA ER) 18 MG C12A Take 18 mg by mouth 2 (two) times daily.   No facility-administered encounter medications on file as of 10/13/2017.    Allergies  Allergen Reactions  . Erythromycin Diarrhea and Nausea And Vomiting   Patient Active Problem List   Diagnosis Date Noted  . 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 OA (osteoarthritis) of knee 03/05/2015  . Rib pain on left side 02/27/2015  . URI, acute 09/17/2013  . Swelling of limb-Bilateral leg Left > than right 07/11/2013  . Numbness-Left foot 07/11/2013  . Chronic venous insufficiency 04/19/2013  . Left hand pain 02/09/2013  . Right shoulder pain 09/12/2012  . Edema 04/24/2012  . Grief 11/10/2011  . ABSCESS, TOOTH 02/03/2011  . OSTEOPENIA 10/28/2010  . HYPERKALEMIA 07/15/2010  . ARTHRALGIA 04/08/2010  . FATIGUE 04/08/2010  . XEROSTOMIA 02/04/2010  . ECZEMA 10/29/2009  . TOBACCO USE, QUIT 10/29/2009  . Anemia 06/18/2009  . 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 Diarrhea 06/18/2009  . OPACITY, VITREOUS HUMOR 01/15/2009  . COLITIS 10/16/2008  . KNEE PAIN 04/10/2008  . HYPERCHOLESTEROLEMIA 01/10/2008  . Anxiety state 11/08/2007  . Depression 11/08/2007  . Osteoarthritis 11/08/2007  . LOW BACK PAIN 11/08/2007  . Diabetes mellitus type 2, controlled ([place]) 10/07/2007  . Essential hypertension  10/07/2007  . GERD 10/07/2007  . MICROALBUMINURIA 10/07/2007   Social History   Social History  . Marital status: Single    Spouse name: N/A  . Number of children: N/A  . Years of education: N/A   Occupational History  . Not on file.   Social History Main Topics  . Smoking status: Former Smoker    Packs/day: 1.00    Years: 20.00    Types: Cigarettes    Quit date: 12/13/1988  . Smokeless tobacco: Never Used  . Alcohol use 0.0 oz/week     Comment: rarely  . Drug use: No  . Sexual activity: Not on file   Other Topics Concern  . Not on file   Social History Narrative  . No narrative on file    [name]'s family history includes Cancer in her brother; Crohn's disease in her maternal uncle; Depression in her mother; Diabetes in her brother, brother,  father, mother, and other; Heart attack in her brother; Heart disease in her brother and brother; Hyperlipidemia in her brother, brother, and father; Hypertension in her brother, brother, father, and mother; Stroke in her brother; Stroke (age of onset: 13) in her mother.      Objective:    Vitals:   10/13/17 1501  BP: 128/76  Pulse: 80    Physical Exam  well-developed older white female in no acute distress, blood pressure 128/76 pulse 80, height 5 foot 3, weight 252, BMI 44.6. HEENT; nontraumatic normocephalic EOMI PERRLA sclera anicteric, Cardiovascular; regular rate and rhythm with S1-S2 no murmur or gallop, Pulmonary; clear bilaterally, Abdomen; obese soft she has some mild  generalized tenderness no guarding or rebound no palpable mass or hepatosplenomegaly bowel sounds hyperactive, Rectal ;exam not done, Extremities ;no clubbing cyanosis or edema skin warm and dry, Neuropsych; mood and affect appropriate, she was bit tearful       Assessment & Plan:   #70 66 year old white female with one half week history of acute diarrheal illness, associated with generalized abdominal discomfort cramping and decrease in  appetite. Patient with prior history of C. difficile colitis and also previous diagnosis of lymphocytic colitis which has been inactive Rule out acute infectious gastroenteritis, rule out recurrent C. difficile colitis, rule out possible reactivation of lymphocytic colitis #2 adult-onset diabetes mellitus #3 chronic kidney disease #4 osteoarthritis #5 hypertension #6 chronic anemia felt secondary to chronic kidney disease #7 history of adenomatous colon polyps-up-to-date with colonoscopy last done February 2016, no adenomatous polyps found #8 diverticulosis  Plan; Patient is asked to push fluids, and continue soft bland diet Will check CBC with differential, BMET, sedimentation rate, stool for C. difficile PCR and stool for lactoferrin She will continue samples of Uceris 9 mg by mouth every morning until above studies return, then can decide if continuing a longer taper course of Uceris  is indicated.  Pranish Akhavan S Nishant Schrecengost PA-C 10/13/2017   Cc: Plotnikov, Evie Lacks, MD

## 2017-10-17 ENCOUNTER — Other Ambulatory Visit (HOSPITAL_COMMUNITY): Payer: Self-pay | Admitting: *Deleted

## 2017-10-18 ENCOUNTER — Ambulatory Visit (HOSPITAL_COMMUNITY)
Admission: RE | Admit: 2017-10-18 | Discharge: 2017-10-18 | Disposition: A | Discharge status: Home / Self Care | Payer: Medicare Other | Source: Ambulatory Visit | Attending: Nephrology | Admitting: Nephrology

## 2017-10-18 DIAGNOSIS — D631 Anemia in chronic kidney disease: Secondary | ICD-10-CM | POA: Insufficient documentation

## 2017-10-18 DIAGNOSIS — N189 Chronic kidney disease, unspecified: Secondary | ICD-10-CM | POA: Diagnosis not present

## 2017-10-18 MED ORDER — SODIUM CHLORIDE 0.9 % IV SOLN
510.0000 mg | INTRAVENOUS | Status: DC
  Administered 2017-10-18: 510 mg via INTRAVENOUS
  Filled 2017-10-18: qty 17

## 2017-10-18 MED FILL — LORazepam 2 MG TABS: 2 | 30 days supply | Qty: 60 | Fill #1

## 2017-10-18 MED FILL — PILOCARPINE HCL 5 MG TABLET: 5 | 30 days supply | Qty: 60 | Fill #4

## 2017-10-18 MED FILL — CYCLOBENZAPRINE 10 MG TAB: 10 | 90 days supply | Qty: 270 | Fill #0

## 2017-10-21 MED FILL — oxyCODONE HCL 15 MG TABS: 15 | 30 days supply | Qty: 120 | Fill #0

## 2017-10-21 MED FILL — LIDOCAINE 5% OINTMENT: 5 | 10 days supply | Qty: 30 | Fill #1

## 2017-10-25 ENCOUNTER — Other Ambulatory Visit: Payer: Self-pay | Admitting: Internal Medicine

## 2017-10-25 ENCOUNTER — Ambulatory Visit (HOSPITAL_COMMUNITY)
Admission: RE | Admit: 2017-10-25 | Discharge: 2017-10-25 | Disposition: A | Discharge status: Home / Self Care | Payer: Medicare Other | Source: Ambulatory Visit | Attending: Nephrology | Admitting: Nephrology

## 2017-10-25 DIAGNOSIS — N189 Chronic kidney disease, unspecified: Secondary | ICD-10-CM | POA: Diagnosis not present

## 2017-10-25 DIAGNOSIS — D631 Anemia in chronic kidney disease: Secondary | ICD-10-CM | POA: Diagnosis not present

## 2017-10-25 MED ORDER — SODIUM CHLORIDE 0.9 % IV SOLN
510.0000 mg | INTRAVENOUS | Status: DC
  Administered 2017-10-25: 510 mg via INTRAVENOUS
  Filled 2017-10-25: qty 17

## 2017-10-25 MED FILL — DULoxetine HCL 60 MG CPEP: 60 | 90 days supply | Qty: 90 | Fill #2

## 2017-10-27 MED FILL — PANTOPRAZOLE SOD DR 40 MG T: 40 | 90 days supply | Qty: 90 | Fill #0

## 2017-11-01 MED FILL — GLIMEPIRIDE 1 MG TABLET: 1 | 90 days supply | Qty: 90 | Fill #1

## 2017-11-01 MED FILL — CARVEDILOL 25 MG TABLET: 25 | 90 days supply | Qty: 180 | Fill #1

## 2017-11-02 ENCOUNTER — Ambulatory Visit (INDEPENDENT_AMBULATORY_CARE_PROVIDER_SITE_OTHER): Payer: Medicare Other | Admitting: Internal Medicine

## 2017-11-02 ENCOUNTER — Encounter: Payer: Self-pay | Admitting: Internal Medicine

## 2017-11-02 ENCOUNTER — Other Ambulatory Visit (INDEPENDENT_AMBULATORY_CARE_PROVIDER_SITE_OTHER): Payer: Medicare Other

## 2017-11-02 VITALS — BP 134/78 | HR 77 | Temp 98.4°F | Ht 62.0 in | Wt 257.0 lb

## 2017-11-02 DIAGNOSIS — I10 Essential (primary) hypertension: Secondary | ICD-10-CM

## 2017-11-02 DIAGNOSIS — N189 Chronic kidney disease, unspecified: Secondary | ICD-10-CM | POA: Diagnosis not present

## 2017-11-02 DIAGNOSIS — M170 Bilateral primary osteoarthritis of knee: Secondary | ICD-10-CM | POA: Diagnosis not present

## 2017-11-02 DIAGNOSIS — M545 Low back pain, unspecified: Secondary | ICD-10-CM

## 2017-11-02 DIAGNOSIS — G8929 Other chronic pain: Secondary | ICD-10-CM | POA: Diagnosis not present

## 2017-11-02 DIAGNOSIS — F332 Major depressive disorder, recurrent severe without psychotic features: Secondary | ICD-10-CM

## 2017-11-02 DIAGNOSIS — R509 Fever, unspecified: Secondary | ICD-10-CM | POA: Diagnosis not present

## 2017-11-02 LAB — URINALYSIS, ROUTINE W REFLEX MICROSCOPIC
Bilirubin Urine: NEGATIVE
Ketones, ur: NEGATIVE
Nitrite: NEGATIVE
Specific Gravity, Urine: 1.01 (ref 1.000–1.030)
Total Protein, Urine: NEGATIVE
Urine Glucose: NEGATIVE
Urobilinogen, UA: 0.2 (ref 0.0–1.0)
pH: 5.5 (ref 5.0–8.0)

## 2017-11-02 MED ORDER — CIPROFLOXACIN HCL 250 MG PO TABS: 250.0000 mg | ORAL_TABLET | Freq: Two times a day (BID) | ORAL | 0 refills | Status: DC

## 2017-11-02 NOTE — Assessment & Plan Note (Addendum)
Resolved UA Cipro if UA is abnormal

## 2017-11-02 NOTE — Progress Notes (Signed)
Subjective:  Patient ID: [name], female    DOB: 09-Nov-1951  Age: 66 y.o. MRN: [id number]  CC: No chief complaint on file.   HPI [name] presents for chronic pain, OA, DM, HTN f/u Pt had a febrile illness a week ago - better now  Outpatient Medications Prior to Visit  Medication Sig Dispense Refill  . amoxicillin (AMOXIL) 500 MG capsule TAKE 4 CAPSULES BY MOUTH 1 HOUR PRIOR TO DENTAL PROCEDURE/CLEANING 16 capsule 1  . Budesonide ER (UCERIS) 9 MG TB24 Take 9 mg by mouth daily. Samples of this drug were given to the patient, quantity 24, Lot Number [id number] 24 tablet 0  . buPROPion (WELLBUTRIN XL) 150 MG 24 hr tablet Take 1 tablet (150 mg total) by mouth 2 (two) times daily. 180 tablet 3  . carvedilol (COREG) 25 MG tablet TAKE 1 TABLET BY MOUTH 2 TIMES DAILY WITH A MEAL. (Patient taking differently: TAKE 1/2 TABLET BY MOUTH 2 TIMES DAILY WITH A MEAL.) 180 tablet 2  . cyclobenzaprine (FLEXERIL) 10 MG tablet Take 3 tablets (30 mg total) by mouth at bedtime. 270 tablet 0  . DULoxetine (CYMBALTA) 60 MG capsule TAKE 1 CAPSULE (60 MG TOTAL) BY MOUTH AT BEDTIME. 90 capsule 2  . furosemide (LASIX) 40 MG tablet Take 1-2 tablets (40-80 mg total) by mouth daily. 180 tablet 2  . glimepiride (AMARYL) 1 MG tablet TAKE 1 TABLET BY MOUTH DAILY BEFORE BREAKFAST. 90 tablet 3  . glucose blood (ONE TOUCH TEST STRIPS) test strip Use as instructed 50 each 3  . Lancets (ONETOUCH ULTRASOFT) lancets Use as instructed 100 each 3  . lidocaine (LIDODERM) 5 % Place 1-2 patches onto the skin daily. Remove & Discard patch within 12 hours or as directed by MD 60 patch 4  . lidocaine (XYLOCAINE) 5 % ointment Apply topically 3 (three) times daily as needed. 100 g 1  . LORazepam (ATIVAN) 2 MG tablet Take 0.5 tablets (1 mg total) by mouth every 6 (six) hours as needed for anxiety. 60 tablet 3  . Multiple Vitamin (MULTIVITAMIN WITH MINERALS) TABS tablet Take 1 tablet by mouth daily.    Marland Kitchen MYRBETRIQ 50 MG TB24 tablet  Take 50 mg by mouth daily.  0  . Omega-3 300 MG CAPS Take 300 mg by mouth at bedtime.    Marland Kitchen oxyCODONE (ROXICODONE) 15 MG immediate release tablet Take 1 tablet (15 mg total) by mouth every 6 (six) hours as needed for pain. 120 tablet 0  . OxyCODONE ER (XTAMPZA ER) 18 MG C12A Take 1 tablet by mouth 2 (two) times daily.    . pantoprazole (PROTONIX) 40 MG tablet TAKE 1 TABLET BY MOUTH DAILY 90 tablet 2  . pilocarpine (SALAGEN) 5 MG tablet Take 1 tablet (5 mg total) by mouth 2 (two) times daily. 60 tablet 5  . pioglitazone (ACTOS) 15 MG tablet TAKE 1 TABLET BY MOUTH ONCE DAILY 90 tablet 3  . polyvinyl alcohol (LIQUIFILM TEARS) 1.4 % ophthalmic solution Place 1 drop into both eyes daily as needed for dry eyes.    . PSYLLIUM PO Take 1 capsule by mouth 2 (two) times daily.    . VESICARE 5 MG tablet Take 5 mg by mouth daily.  3  . Vitamin D, Cholecalciferol, 1000 UNITS TABS Take 1,000 Units by mouth daily.     No facility-administered medications prior to visit.     ROS Review of Systems  Constitutional: Positive for fatigue. Negative for activity change, appetite change, chills  and unexpected weight change.  HENT: Negative for congestion, mouth sores and sinus pressure.   Eyes: Negative for visual disturbance.  Respiratory: Negative for cough and chest tightness.   Gastrointestinal: Negative for abdominal pain and nausea.  Genitourinary: Negative for difficulty urinating, frequency and vaginal pain.  Musculoskeletal: Positive for arthralgias, back pain, gait problem and myalgias.  Skin: Negative for pallor and rash.  Neurological: Positive for weakness. Negative for dizziness, tremors, numbness and headaches.  Psychiatric/Behavioral: Negative for confusion, sleep disturbance and suicidal ideas. The patient is nervous/anxious.     Objective:  BP 134/78 (BP Location: Left Arm, Patient Position: Sitting, Cuff Size: Large)   Pulse 77   Temp 98.4 F (36.9 C) (Oral)   Ht 5\' 2"  (1.575 m)   Wt 257  lb (116.6 kg)   SpO2 98%   BMI 47.01 kg/m   BP Readings from Last 3 Encounters:  11/02/17 134/78  10/25/17 140/63  10/18/17 (!) 153/68    Wt Readings from Last 3 Encounters:  11/02/17 257 lb (116.6 kg)  10/18/17 253 lb (114.8 kg)  10/13/17 252 lb (114.3 kg)    Physical Exam  Constitutional: She appears well-developed. No distress.  HENT:  Head: Normocephalic.  Right Ear: External ear normal.  Left Ear: External ear normal.  Nose: Nose normal.  Mouth/Throat: Oropharynx is clear and moist.  Eyes: Conjunctivae are normal. Pupils are equal, round, and reactive to light. Right eye exhibits no discharge. Left eye exhibits no discharge.  Neck: Normal range of motion. Neck supple. No JVD present. No tracheal deviation present. No thyromegaly present.  Cardiovascular: Normal rate, regular rhythm and normal heart sounds.  Pulmonary/Chest: No stridor. No respiratory distress. She has no wheezes.  Abdominal: Soft. Bowel sounds are normal. She exhibits no distension and no mass. There is no tenderness. There is no rebound and no guarding.  Musculoskeletal: She exhibits tenderness. She exhibits no edema.  Lymphadenopathy:    She has no cervical adenopathy.  Neurological: She displays normal reflexes. No cranial nerve deficit. She exhibits normal muscle tone. Coordination normal.  Skin: No rash noted. No erythema.  Psychiatric: She has a normal mood and affect. Her behavior is normal. Judgment and thought content normal.  painful knees, LS spine  Lab Results  Component Value Date   WBC 8.9 10/13/2017   HGB 10.2 (L) 10/13/2017   HCT 31.6 (L) 10/13/2017   PLT 239.0 10/13/2017   GLUCOSE 165 (H) 10/13/2017   CHOL 215 (H) 09/25/2014   TRIG 154.0 (H) 09/25/2014   HDL 46.00 09/25/2014   LDLCALC 138 (H) 09/25/2014   ALT 10 08/20/2016   AST 12 08/20/2016   NA 136 10/13/2017   K 3.8 10/13/2017   CL 100 10/13/2017   CREATININE 2.29 (H) 10/13/2017   BUN 53 (H) 10/13/2017   CO2 29  10/13/2017   TSH 1.64 07/25/2013   INR 1.04 02/26/2015   HGBA1C 6.8 (H) 09/19/2017    No results found.  Assessment & Plan:   There are no diagnoses linked to this encounter. I am having [name] maintain her onetouch ultrasoft, polyvinyl alcohol, Vitamin D (Cholecalciferol), multivitamin with minerals, Omega-3, PSYLLIUM PO, amoxicillin, pioglitazone, carvedilol, DULoxetine, pilocarpine, MYRBETRIQ, furosemide, LORazepam, lidocaine, glimepiride, VESICARE, cyclobenzaprine, buPROPion, glucose blood, lidocaine, oxyCODONE, Budesonide ER, OxyCODONE ER, and pantoprazole.  No orders of the defined types were placed in this encounter.    Follow-up: No Follow-up on file.  [name], MD

## 2017-11-02 NOTE — Assessment & Plan Note (Signed)
On Coreg 

## 2017-11-02 NOTE — Assessment & Plan Note (Signed)
Labs

## 2017-11-02 NOTE — Assessment & Plan Note (Signed)
Chronic and severe. Chronic opioid use. Oxycontin and Roxycodone.  Potential benefits of a long term opioids use as well as potential risks (i.e. addiction risk, apnea etc) and complications (i.e. Somnolence, constipation and others) were explained to the patient and were aknowledged.

## 2017-11-02 NOTE — Assessment & Plan Note (Signed)
Oxycod and Oxycont  Potential benefits of a long term opioids use as well as potential risks (i.e. addiction risk, apnea etc) and complications (i.e. Somnolence, constipation and others) were explained to the patient and were aknowledged.

## 2017-11-02 NOTE — Assessment & Plan Note (Signed)
Wellbutrin

## 2017-11-08 MED FILL — VESIcare 5 MG TABS: 5 | 90 days supply | Qty: 90 | Fill #1

## 2017-11-08 MED FILL — MYRBETRIQ ER 50 MG TABLET: 50 | 90 days supply | Qty: 90 | Fill #1

## 2017-11-10 MED FILL — XTAMPZA ER 18 MG C12A: 18 | 30 days supply | Qty: 60 | Fill #0

## 2017-11-15 NOTE — Telephone Encounter (Signed)
error 

## 2017-11-17 ENCOUNTER — Other Ambulatory Visit: Payer: Self-pay

## 2017-11-17 MED ORDER — DULOXETINE HCL 60 MG PO CPEP: 60.0000 mg | ORAL_CAPSULE | Freq: Every day | ORAL | 2 refills | Status: DC

## 2017-11-22 MED FILL — oxyCODONE HCL 15 MG TABS: 15 | 30 days supply | Qty: 120 | Fill #0

## 2017-11-22 MED FILL — PILOCARPINE HCL 5 MG TABLET: 5 | 30 days supply | Qty: 60 | Fill #5

## 2017-11-22 MED FILL — CIPROFLOXACIN HCL 250 MG TA: 250 | 7 days supply | Qty: 14 | Fill #0

## 2017-12-05 MED FILL — LORazepam 2 MG TABS: 2 | 30 days supply | Qty: 60 | Fill #2

## 2017-12-05 MED FILL — buPROPion HCL ER (XL) 150 M: 150 | 90 days supply | Qty: 180 | Fill #1

## 2017-12-05 MED FILL — PIOGLITAZONE HCL 15 MG TAB: 15 | 90 days supply | Qty: 90 | Fill #3

## 2017-12-12 MED FILL — XTAMPZA ER 18 MG C12A: 18 | 30 days supply | Qty: 60 | Fill #0

## 2017-12-20 ENCOUNTER — Ambulatory Visit (INDEPENDENT_AMBULATORY_CARE_PROVIDER_SITE_OTHER): Payer: Medicare Other | Admitting: Internal Medicine

## 2017-12-20 ENCOUNTER — Other Ambulatory Visit (INDEPENDENT_AMBULATORY_CARE_PROVIDER_SITE_OTHER): Payer: Medicare Other

## 2017-12-20 ENCOUNTER — Encounter: Payer: Self-pay | Admitting: Internal Medicine

## 2017-12-20 DIAGNOSIS — E119 Type 2 diabetes mellitus without complications: Secondary | ICD-10-CM

## 2017-12-20 DIAGNOSIS — I10 Essential (primary) hypertension: Secondary | ICD-10-CM | POA: Diagnosis not present

## 2017-12-20 DIAGNOSIS — N184 Chronic kidney disease, stage 4 (severe): Secondary | ICD-10-CM

## 2017-12-20 DIAGNOSIS — M545 Low back pain, unspecified: Secondary | ICD-10-CM

## 2017-12-20 DIAGNOSIS — G8929 Other chronic pain: Secondary | ICD-10-CM

## 2017-12-20 LAB — BASIC METABOLIC PANEL
BUN: 42 mg/dL — ABNORMAL HIGH (ref 6–23)
CO2: 34 mEq/L — ABNORMAL HIGH (ref 19–32)
Calcium: 9.9 mg/dL (ref 8.4–10.5)
Chloride: 94 mEq/L — ABNORMAL LOW (ref 96–112)
Creatinine, Ser: 2.27 mg/dL — ABNORMAL HIGH (ref 0.40–1.20)
GFR: 22.9 mL/min — ABNORMAL LOW (ref >60.00)
Glucose, Bld: 151 mg/dL — ABNORMAL HIGH (ref 70–99)
Potassium: 4.2 mEq/L (ref 3.5–5.1)
Sodium: 136 mEq/L (ref 135–145)

## 2017-12-20 MED ORDER — OXYCODONE HCL 15 MG PO TABS: 15.0000 mg | ORAL_TABLET | Freq: Four times a day (QID) | ORAL | 0 refills | Status: DC | PRN

## 2017-12-20 MED ORDER — PIOGLITAZONE HCL 15 MG PO TABS: 15.0000 mg | ORAL_TABLET | Freq: Every day | ORAL | 3 refills | Status: DC

## 2017-12-20 MED ORDER — LORAZEPAM 2 MG PO TABS: 1.0000 mg | ORAL_TABLET | Freq: Four times a day (QID) | ORAL | 1 refills | Status: DC | PRN

## 2017-12-20 MED ORDER — OXYCODONE ER 18 MG PO C12A: 1.0000 | EXTENDED_RELEASE_CAPSULE | Freq: Two times a day (BID) | ORAL | 0 refills | Status: DC

## 2017-12-20 MED ORDER — DULOXETINE HCL 60 MG PO CPEP: 60.0000 mg | ORAL_CAPSULE | Freq: Every day | ORAL | 3 refills | Status: DC

## 2017-12-20 MED ORDER — GLIMEPIRIDE 1 MG PO TABS: ORAL_TABLET | ORAL | 3 refills | Status: DC

## 2017-12-20 MED ORDER — LIDOCAINE 5 % EX PTCH: 1.0000 | MEDICATED_PATCH | CUTANEOUS | 4 refills | Status: DC

## 2017-12-20 MED ORDER — PILOCARPINE HCL 5 MG PO TABS: 5.0000 mg | ORAL_TABLET | Freq: Two times a day (BID) | ORAL | 3 refills | Status: DC

## 2017-12-20 MED ORDER — PANTOPRAZOLE SODIUM 40 MG PO TBEC: 40.0000 mg | DELAYED_RELEASE_TABLET | Freq: Every day | ORAL | 3 refills | Status: DC

## 2017-12-20 MED ORDER — BLOOD GLUCOSE METER KIT: PACK | 0 refills | Status: DC

## 2017-12-20 MED ORDER — BUPROPION HCL ER (XL) 150 MG PO TB24: 150.0000 mg | ORAL_TABLET | Freq: Two times a day (BID) | ORAL | 3 refills | Status: DC

## 2017-12-20 MED ORDER — ONETOUCH ULTRASOFT LANCETS MISC: 3 refills | Status: DC

## 2017-12-20 MED ORDER — LIDOCAINE 5 % EX OINT: TOPICAL_OINTMENT | Freq: Three times a day (TID) | CUTANEOUS | 3 refills | Status: DC | PRN

## 2017-12-20 MED ORDER — CARVEDILOL 25 MG PO TABS: ORAL_TABLET | ORAL | 3 refills | Status: DC

## 2017-12-20 MED ORDER — CYCLOBENZAPRINE HCL 10 MG PO TABS: 30.0000 mg | ORAL_TABLET | Freq: Every day | ORAL | 0 refills | Status: DC

## 2017-12-20 MED ORDER — GLUCOSE BLOOD VI STRP: ORAL_STRIP | 3 refills | Status: DC

## 2017-12-20 MED ORDER — FUROSEMIDE 40 MG PO TABS: 40.0000 mg | ORAL_TABLET | Freq: Every day | ORAL | 3 refills | Status: DC

## 2017-12-20 NOTE — Assessment & Plan Note (Signed)
Xtampza ER and Roxycodone.  Potential benefits of a long term opioids use as well as potential risks (i.e. addiction risk, apnea etc) and complications (i.e. Somnolence, constipation and others) were explained to the patient and were aknowledged.

## 2017-12-20 NOTE — Assessment & Plan Note (Signed)
BP Readings from Last 3 Encounters:  12/20/17 (!) 142/78  11/02/17 134/78  10/25/17 140/63

## 2017-12-20 NOTE — Progress Notes (Signed)
Subjective:  Patient ID: [name], female    DOB: 01/19/1951  Age: 67 y.o. MRN: [id number]  CC: No chief complaint on file.   HPI [name] presents for chronic LBP, depression, OA , HTN, DM f/u  Outpatient Medications Prior to Visit  Medication Sig Dispense Refill  . amoxicillin (AMOXIL) 500 MG capsule TAKE 4 CAPSULES BY MOUTH 1 HOUR PRIOR TO DENTAL PROCEDURE/CLEANING 16 capsule 1  . buPROPion (WELLBUTRIN XL) 150 MG 24 hr tablet Take 1 tablet (150 mg total) by mouth 2 (two) times daily. 180 tablet 3  . carvedilol (COREG) 25 MG tablet TAKE 1 TABLET BY MOUTH 2 TIMES DAILY WITH A MEAL. (Patient taking differently: TAKE 1/2 TABLET BY MOUTH 2 TIMES DAILY WITH A MEAL.) 180 tablet 2  . cyclobenzaprine (FLEXERIL) 10 MG tablet Take 3 tablets (30 mg total) by mouth at bedtime. 270 tablet 0  . DULoxetine (CYMBALTA) 60 MG capsule Take 1 capsule (60 mg total) by mouth at bedtime. 90 capsule 2  . furosemide (LASIX) 40 MG tablet Take 1-2 tablets (40-80 mg total) by mouth daily. 180 tablet 2  . glimepiride (AMARYL) 1 MG tablet TAKE 1 TABLET BY MOUTH DAILY BEFORE BREAKFAST. 90 tablet 3  . glucose blood (ONE TOUCH TEST STRIPS) test strip Use as instructed 50 each 3  . Lancets (ONETOUCH ULTRASOFT) lancets Use as instructed 100 each 3  . lidocaine (LIDODERM) 5 % Place 1-2 patches onto the skin daily. Remove & Discard patch within 12 hours or as directed by MD 60 patch 4  . lidocaine (XYLOCAINE) 5 % ointment Apply topically 3 (three) times daily as needed. 100 g 1  . LORazepam (ATIVAN) 2 MG tablet Take 0.5 tablets (1 mg total) by mouth every 6 (six) hours as needed for anxiety. 60 tablet 3  . Multiple Vitamin (MULTIVITAMIN WITH MINERALS) TABS tablet Take 1 tablet by mouth daily.    Marland Kitchen MYRBETRIQ 50 MG TB24 tablet Take 50 mg by mouth daily.  0  . Omega-3 300 MG CAPS Take 300 mg by mouth at bedtime.    Marland Kitchen oxyCODONE (ROXICODONE) 15 MG immediate release tablet Take 1 tablet (15 mg total) by mouth every 6  (six) hours as needed for pain. 120 tablet 0  . OxyCODONE ER (XTAMPZA ER) 18 MG C12A Take 1 tablet by mouth 2 (two) times daily.    . pantoprazole (PROTONIX) 40 MG tablet TAKE 1 TABLET BY MOUTH DAILY 90 tablet 2  . pilocarpine (SALAGEN) 5 MG tablet Take 1 tablet (5 mg total) by mouth 2 (two) times daily. 60 tablet 5  . pioglitazone (ACTOS) 15 MG tablet TAKE 1 TABLET BY MOUTH ONCE DAILY 90 tablet 3  . polyvinyl alcohol (LIQUIFILM TEARS) 1.4 % ophthalmic solution Place 1 drop into both eyes daily as needed for dry eyes.    . PSYLLIUM PO Take 1 capsule by mouth 2 (two) times daily.    . VESICARE 5 MG tablet Take 5 mg by mouth daily.  3  . Vitamin D, Cholecalciferol, 1000 UNITS TABS Take 1,000 Units by mouth daily.    . Budesonide ER (UCERIS) 9 MG TB24 Take 9 mg by mouth daily. Samples of this drug were given to the patient, quantity 24, Lot Number [id number] 24 tablet 0  . ciprofloxacin (CIPRO) 250 MG tablet Take 1 tablet (250 mg total) by mouth 2 (two) times daily. 14 tablet 0   No facility-administered medications prior to visit.     ROS Review of  Systems  Constitutional: Negative for activity change, appetite change, chills, fatigue and unexpected weight change.  HENT: Negative for congestion, mouth sores and sinus pressure.   Eyes: Negative for visual disturbance.  Respiratory: Negative for cough and chest tightness.   Gastrointestinal: Negative for abdominal pain and nausea.  Genitourinary: Negative for difficulty urinating, frequency and vaginal pain.  Musculoskeletal: Positive for arthralgias, back pain and gait problem.  Skin: Negative for pallor and rash.  Neurological: Positive for weakness. Negative for dizziness, tremors, numbness and headaches.  Psychiatric/Behavioral: Negative for confusion and sleep disturbance.    Objective:  BP (!) 142/78 (BP Location: Left Arm, Patient Position: Sitting, Cuff Size: Large)   Pulse 77   Temp 98.6 F (37 C) (Oral)   Ht 5\' 2"  (1.575 m)   Wt  259 lb (117.5 kg)   SpO2 99%   BMI 47.37 kg/m   BP Readings from Last 3 Encounters:  12/20/17 (!) 142/78  11/02/17 134/78  10/25/17 140/63    Wt Readings from Last 3 Encounters:  12/20/17 259 lb (117.5 kg)  11/02/17 257 lb (116.6 kg)  10/18/17 253 lb (114.8 kg)    Physical Exam  Constitutional: She appears well-developed. No distress.  HENT:  Head: Normocephalic.  Right Ear: External ear normal.  Left Ear: External ear normal.  Nose: Nose normal.  Mouth/Throat: Oropharynx is clear and moist.  Eyes: Conjunctivae are normal. Pupils are equal, round, and reactive to light. Right eye exhibits no discharge. Left eye exhibits no discharge.  Neck: Normal range of motion. Neck supple. No JVD present. No tracheal deviation present. No thyromegaly present.  Cardiovascular: Normal rate, regular rhythm and normal heart sounds.  Pulmonary/Chest: No stridor. No respiratory distress. She has no wheezes.  Abdominal: Soft. Bowel sounds are normal. She exhibits no distension and no mass. There is no tenderness. There is no rebound and no guarding.  Musculoskeletal: She exhibits tenderness. She exhibits no edema.  Lymphadenopathy:    She has no cervical adenopathy.  Neurological: She displays normal reflexes. No cranial nerve deficit. She exhibits normal muscle tone. Coordination abnormal.  Skin: No rash noted. No erythema.  Psychiatric: She has a normal mood and affect. Her behavior is normal. Judgment and thought content normal.  painful knees obese  Lab Results  Component Value Date   WBC 8.9 10/13/2017   HGB 10.2 (L) 10/13/2017   HCT 31.6 (L) 10/13/2017   PLT 239.0 10/13/2017   GLUCOSE 165 (H) 10/13/2017   CHOL 215 (H) 09/25/2014   TRIG 154.0 (H) 09/25/2014   HDL 46.00 09/25/2014   LDLCALC 138 (H) 09/25/2014   ALT 10 08/20/2016   AST 12 08/20/2016   NA 136 10/13/2017   K 3.8 10/13/2017   CL 100 10/13/2017   CREATININE 2.29 (H) 10/13/2017   BUN 53 (H) 10/13/2017   CO2 29  10/13/2017   TSH 1.64 07/25/2013   INR 1.04 02/26/2015   HGBA1C 6.8 (H) 09/19/2017    No results found.  Assessment & Plan:   There are no diagnoses linked to this encounter. I have discontinued Everlean Alstrom. Garabedian's Budesonide ER and ciprofloxacin. I am also having her maintain her onetouch ultrasoft, polyvinyl alcohol, Vitamin D (Cholecalciferol), multivitamin with minerals, Omega-3, PSYLLIUM PO, amoxicillin, pioglitazone, carvedilol, pilocarpine, MYRBETRIQ, furosemide, LORazepam, lidocaine, glimepiride, VESICARE, cyclobenzaprine, buPROPion, glucose blood, lidocaine, oxyCODONE, OxyCODONE ER, pantoprazole, and DULoxetine.  No orders of the defined types were placed in this encounter.    Follow-up: No Follow-up on file.  [name], MD

## 2017-12-20 NOTE — Assessment & Plan Note (Signed)
labs

## 2017-12-21 LAB — HEMOGLOBIN A1C: Hgb A1c MFr Bld: 6.7 % — ABNORMAL HIGH (ref 4.6–6.5)

## 2017-12-23 MED FILL — oxyCODONE HCL 15 MG TABS: 15 | 30 days supply | Qty: 120 | Fill #0

## 2017-12-26 ENCOUNTER — Telehealth: Payer: Self-pay | Admitting: Internal Medicine

## 2017-12-26 MED ORDER — AMOXICILLIN 500 MG PO CAPS: ORAL_CAPSULE | ORAL | 1 refills | Status: DC

## 2017-12-26 MED ORDER — VESICARE 5 MG PO TABS: 5.0000 mg | ORAL_TABLET | Freq: Every day | ORAL | 1 refills | Status: DC

## 2017-12-26 NOTE — Telephone Encounter (Signed)
Copied from [place] [id number]. Topic: General - Other >> Dec 26, 2017  3:13 PM Darl Householder, RMA wrote: Reason for CRM: Medication refill request for Vesicare 5 mg and amoxicillin 500 mg to be sent to [facility]

## 2017-12-30 DIAGNOSIS — M25511 Pain in right shoulder: Secondary | ICD-10-CM | POA: Diagnosis not present

## 2017-12-30 DIAGNOSIS — S46011A Strain of muscle(s) and tendon(s) of the rotator cuff of right shoulder, initial encounter: Secondary | ICD-10-CM | POA: Diagnosis not present

## 2018-01-11 ENCOUNTER — Telehealth: Payer: Self-pay | Admitting: Internal Medicine

## 2018-01-11 NOTE — Telephone Encounter (Signed)
Copied from [place]. Topic: Quick Communication - See Telephone Encounter >> Jan 11, 2018  3:19 PM Vernona Rieger wrote: CRM for notification. See Telephone encounter for:   01/11/18.  [facility] wants to know if the patient needs to be on both lidocaine (LIDODERM) 5 % & lidocaine (XYLOCAINE) 5 % ointment. They said they were prescribed on the same day, so they are just making sure. Call back is [phone] reference number is [id number]

## 2018-01-13 ENCOUNTER — Telehealth: Payer: Self-pay | Admitting: *Deleted

## 2018-01-13 NOTE — Telephone Encounter (Signed)
We will discontinue Lidoderm.  Thanks

## 2018-01-13 NOTE — Telephone Encounter (Signed)
Rec'd call from [place] stating due to the Opioid/Benzo law needing to know if MD aware of rx both Ativan and Oxycodone together, if so need to know reason. Also if pt should be taking both Oxycodone 15 mg and Oxycodone ER 18mg , if will need diagnosis. Received script for both on 12/20/17. When calling back use ref# [id number]...Johny Chess

## 2018-01-13 NOTE — Telephone Encounter (Signed)
Notified [facility] spoke w/pharmacist Valarie w/MD response...lmb

## 2018-01-15 NOTE — Telephone Encounter (Signed)
I am aware of the benzodiazepine and opioid combination.  I am aware of the oxycodone and OxyContin.  The patient has been on these medicines for a long time.  Her diagnosis is bilateral knee pain/osteoarthritis and a low back pain.  Thank you

## 2018-01-16 NOTE — Telephone Encounter (Signed)
Notified [facility] spoke w/pharmacist Nevin Bloodgood) gave MD response...[name]

## 2018-01-17 MED FILL — XTAMPZA ER 18 MG C12A: 18 | 30 days supply | Qty: 60 | Fill #0

## 2018-01-20 DIAGNOSIS — M25511 Pain in right shoulder: Secondary | ICD-10-CM | POA: Insufficient documentation

## 2018-01-23 MED FILL — oxyCODONE HCL 15 MG TABS: 15 | 30 days supply | Qty: 120 | Fill #0

## 2018-01-31 ENCOUNTER — Telehealth: Payer: Self-pay | Admitting: Internal Medicine

## 2018-01-31 NOTE — Telephone Encounter (Signed)
Copied from [place]. Topic: Quick Communication - Rx Refill/Question >> Jan 31, 2018  9:22 AM [place] wrote: Medication: lidocaine patches   Has the patient contacted their pharmacy? No   (Agent: If no, request that the patient contact the pharmacy for the refill.)   Preferred Pharmacy (with phone number or street name):[facility], [place]   Agent: Please be advised that RX refills may take up to 3 business days. We ask that you follow-up with your pharmacy.

## 2018-01-31 NOTE — Telephone Encounter (Signed)
Contacted pt regarding refill request for lidocaine patches; she states that these are needed for large areas that lidocaine ointment can not cover; she also states that she may be having a torn rotator cuff and she sees Dr Eddie Dibbles at [facility], but Dr Alain Marion writes the prescriptions for her pain meds; will route refill request to [employer].

## 2018-02-01 NOTE — Telephone Encounter (Signed)
Her insurance will not cover both lidocaine ointment and lidocaine patches.  She needs to stick with 1 of the 2.  Thank you

## 2018-02-01 NOTE — Telephone Encounter (Signed)
Notified pt w/MD response.../lmb 

## 2018-02-06 ENCOUNTER — Other Ambulatory Visit: Payer: Self-pay | Admitting: Family

## 2018-02-06 ENCOUNTER — Ambulatory Visit (INDEPENDENT_AMBULATORY_CARE_PROVIDER_SITE_OTHER)
Admission: RE | Admit: 2018-02-06 | Discharge: 2018-02-06 | Disposition: A | Discharge status: Home / Self Care | Payer: Medicare Other | Source: Ambulatory Visit | Attending: Family | Admitting: Family

## 2018-02-06 ENCOUNTER — Encounter: Payer: Self-pay | Admitting: Family

## 2018-02-06 ENCOUNTER — Ambulatory Visit (INDEPENDENT_AMBULATORY_CARE_PROVIDER_SITE_OTHER): Payer: Medicare Other | Admitting: Family

## 2018-02-06 VITALS — BP 138/78 | HR 84 | Temp 97.9°F | Ht 62.0 in | Wt 259.0 lb

## 2018-02-06 DIAGNOSIS — M542 Cervicalgia: Secondary | ICD-10-CM | POA: Diagnosis not present

## 2018-02-06 DIAGNOSIS — M47812 Spondylosis without myelopathy or radiculopathy, cervical region: Secondary | ICD-10-CM | POA: Diagnosis not present

## 2018-02-06 MED ORDER — PREDNISONE 20 MG PO TABS: 20.0000 mg | ORAL_TABLET | Freq: Every day | ORAL | 0 refills | Status: DC

## 2018-02-06 MED FILL — predniSONE 20 MG TABS: 20 | 5 days supply | Qty: 5 | Fill #0

## 2018-02-06 NOTE — Progress Notes (Signed)
[name] is a 67 y.o. female with the following history as recorded in EpicCare:  Patient Active Problem List   Diagnosis Date Noted  . Neck pain 02/08/2018  . CKD (chronic kidney disease) 10/13/2017  . 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 OA (osteoarthritis) of knee 03/05/2015  . Rib pain on left side 02/27/2015  . URI, acute 09/17/2013  . Swelling of limb-Bilateral leg Left > than right 07/11/2013  . Numbness-Left foot 07/11/2013  . Chronic venous insufficiency 04/19/2013  . Left hand pain 02/09/2013  . Right shoulder pain 09/12/2012  . Edema 04/24/2012  . Grief 11/10/2011  . ABSCESS, TOOTH 02/03/2011  . OSTEOPENIA 10/28/2010  . Fever 08/06/2010  . HYPERKALEMIA 07/15/2010  . ARTHRALGIA 04/08/2010  . FATIGUE 04/08/2010  . XEROSTOMIA 02/04/2010  . ECZEMA 10/29/2009  . TOBACCO USE, QUIT 10/29/2009  . Anemia 06/18/2009  . 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 Diarrhea 06/18/2009  . OPACITY, VITREOUS HUMOR 01/15/2009  . COLITIS 10/16/2008  . KNEE PAIN 04/10/2008  . HYPERCHOLESTEROLEMIA 01/10/2008  . Anxiety state 11/08/2007  . Depression 11/08/2007  . Osteoarthritis 11/08/2007  . LOW BACK PAIN 11/08/2007  . Diabetes mellitus type 2, controlled ([place]) 10/07/2007  . Essential hypertension 10/07/2007  . GERD 10/07/2007  . 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 amoxicillin (AMOXIL) 500 MG capsule TAKE 4 CAPSULES BY MOUTH 1 HOUR PRIOR TO DENTAL PROCEDURE/CLEANING 16 capsule 1  . blood glucose meter kit and supplies Dispense based on patient and insurance preference. Used to check blood sugar daily, DX: E11.9 OneTouch 1 each 0  . buPROPion (WELLBUTRIN XL) 150 MG 24 hr tablet Take 1 tablet (150 mg total) by mouth 2 (two) times daily. 180 tablet 3  . carvedilol (COREG) 25 MG tablet TAKE 1/2 TABLET BY MOUTH 2 TIMES DAILY WITH A MEAL. 90 tablet 3  . cyclobenzaprine (FLEXERIL)  10 MG tablet Take 3 tablets (30 mg total) by mouth at bedtime. 270 tablet 0  . DULoxetine (CYMBALTA) 60 MG capsule Take 1 capsule (60 mg total) by mouth at bedtime. 90 capsule 3  . furosemide (LASIX) 40 MG tablet Take 1-2 tablets (40-80 mg total) by mouth daily. 180 tablet 3  . glucose blood (ONE TOUCH TEST STRIPS) test strip Used to check blood sugar daily, DX: E11.9 100 each 3  . Lancets (ONETOUCH ULTRASOFT) lancets Used to check blood sugar daily, DX: E11.9 100 each 3  . lidocaine (XYLOCAINE) 5 % ointment Apply topically 3 (three) times daily as needed. 100 g 3  . LORazepam (ATIVAN) 2 MG tablet Take 0.5 tablets (1 mg total) by mouth every 6 (six) hours as needed for anxiety. 45 tablet 1  . Multiple Vitamin (MULTIVITAMIN WITH MINERALS) TABS tablet Take 1 tablet by mouth daily.    . Omega-3 300 MG CAPS Take 300 mg by mouth at bedtime.    Marland Kitchen oxyCODONE (ROXICODONE) 15 MG immediate release tablet Take 1 tablet (15 mg total) by mouth every 6 (six) hours as needed for pain. 120 tablet 0  . OxyCODONE ER (XTAMPZA ER) 18 MG C12A Take 1 tablet by mouth 2 (two) times daily. 60 each 0  . pantoprazole (PROTONIX) 40 MG tablet Take 1 tablet (40 mg total) by mouth daily. 90 tablet 3  . pilocarpine (SALAGEN) 5 MG tablet Take 1 tablet (5 mg total) by mouth 2 (two) times daily. 180 tablet 3  . pioglitazone (ACTOS) 15 MG  tablet Take 1 tablet (15 mg total) by mouth daily. 90 tablet 3  . polyvinyl alcohol (LIQUIFILM TEARS) 1.4 % ophthalmic solution Place 1 drop into both eyes daily as needed for dry eyes.    . PSYLLIUM PO Take 1 capsule by mouth 2 (two) times daily.    . VESICARE 5 MG tablet Take 1 tablet (5 mg total) by mouth daily. 90 tablet 1  . Vitamin D, Cholecalciferol, 1000 UNITS TABS Take 1,000 Units by mouth daily.    . predniSONE (DELTASONE) 20 MG tablet Take 1 tablet (20 mg total) by mouth daily with breakfast. 5 tablet 0   No current facility-administered medications for this visit.     Allergies:  Erythromycin  Past Medical History:  Diagnosis Date  . Anemia   . Anxiety   . Colitis 2010   microscopic- Dr Henrene Pastor  . Depression   . Diabetes mellitus    type II  . GERD (gastroesophageal reflux disease)   . Headache   . Hypertension   . IBS (irritable bowel syndrome)   . LBP (low back pain)   . Neuropathy    feet bilat   . Osteoarthritis   . Osteopenia   . Pneumonia    hx of 2014   . Renal insufficiency 2011  . Sinusitis    currently being treated with antibiotic will complete 03/04/2015    Past Surgical History:  Procedure Laterality Date  . foramen magnum ependymoma surgery  2003   Dr Rita Ohara  . JOINT REPLACEMENT Bilateral   . NASAL SINUS SURGERY     1973   . TONSILLECTOMY    . TOTAL KNEE ARTHROPLASTY     L 2008, R 2009, R 2016- Dr Maureen Ralphs  . TOTAL KNEE REVISION Right 03/05/2015   Procedure: RIGHT TOTAL KNEE ARTHROPLASTY REVISION;  Surgeon: Gaynelle Arabian, MD;  Location: [facility];  Service: Orthopedics;  Laterality: Right;    Family History  Problem Relation Age of Onset  . Depression Mother   . Hypertension Mother   . Stroke Mother 40  . Diabetes Mother   . Diabetes Father   . Hyperlipidemia Father   . Hypertension Father   . Crohn's disease Maternal Uncle   . Diabetes Other   . Cancer Brother        Prostate  . Diabetes Brother   . Heart disease Brother   . Hyperlipidemia Brother   . Hypertension Brother   . Diabetes Brother   . Heart disease Brother        Heart Disease before age 62  . Heart attack Brother   . Stroke Brother        X's 2  . Hyperlipidemia Brother   . Hypertension Brother   . Colon cancer Neg Hx     Social History   Tobacco Use  . Smoking status: Former Smoker    Packs/day: 1.00    Years: 20.00    Pack years: 20.00    Types: Cigarettes    Last attempt to quit: 12/13/1988    Years since quitting: 29.1  . Smokeless tobacco: Never Used  Substance Use Topics  . Alcohol use: Yes    Alcohol/week: 0.0 oz    Comment: rarely     Subjective:  Woke up 3 days ago with sudden onset of neck pain; felt like she slept on it wrong; does have history of neck spasms; no light sensitivity; has tried taking Oxycontin for pain with little benefit; denies any numbness/ tingling radiating into  her fingertips; notes that she "just can't stand straight up." Is currently taking Flexeril 10 mg 3 x per day;   Objective:  Vitals:   02/06/18 1402  BP: 138/78  Pulse: 84  Temp: 97.9 F (36.6 C)  TempSrc: Oral  SpO2: 98%  Weight: 259 lb 0.6 oz (117.5 kg)  Height: _0  (1.575 m)    General: Well developed, well nourished, in no acute distress; appears highly medicated;  Skin : Warm and dry.  Head: Normocephalic and atraumatic  Lungs: Respirations unlabored; clear to auscultation bilaterally without wheeze, rales, rhonchi  CVS exam: normal rate and regular rhythm.  Musculoskeletal: No deformities; no active joint inflammation  Extremities: No edema, cyanosis, clubbing  Vessels: Symmetric bilaterally  Neurologic: Alert and oriented; speech intact; face symmetrical; moves all extremities well; CNII-XII intact without focal deficit  Assessment:  1. Neck pain     Plan:  Suspect muscular; update X-ray; patient already on high dose of narcotics and Flexeril 10 mg tid; add prednisone 20 mg qd x 5 days; plan to have her follow-up with sports medicine provider in follow-up.   Return for with Dr. Ishmael Holter or Dr. Tamala Julian for neck pain.  No orders of the defined types were placed in this encounter.   Requested Prescriptions   Signed Prescriptions Disp Refills  . predniSONE (DELTASONE) 20 MG tablet 5 tablet 0    Sig: Take 1 tablet (20 mg total) by mouth daily with breakfast.

## 2018-02-07 ENCOUNTER — Ambulatory Visit: Payer: Medicare Other | Admitting: Family Medicine

## 2018-02-07 DIAGNOSIS — Z0289 Encounter for other administrative examinations: Secondary | ICD-10-CM

## 2018-02-07 NOTE — Progress Notes (Deleted)
[name] - 67 y.o. female MRN [id number]  Date of birth: 10-Jul-1951  SUBJECTIVE:  Including CC & ROS.  No chief complaint on file.   [name] is a 67 y.o. female that is  ***.  ***   Review of Systems  HISTORY: Past Medical, Surgical, Social, and Family History Reviewed & Updated per EMR.   Pertinent Historical Findings include:  Past Medical History:  Diagnosis Date  . Anemia   . Anxiety   . Colitis 2010   microscopic- Dr Henrene Pastor  . Depression   . Diabetes mellitus    type II  . GERD (gastroesophageal reflux disease)   . Headache   . Hypertension   . IBS (irritable bowel syndrome)   . LBP (low back pain)   . Neuropathy    feet bilat   . Osteoarthritis   . Osteopenia   . Pneumonia    hx of 2014   . Renal insufficiency 2011  . Sinusitis    currently being treated with antibiotic will complete 03/04/2015    Past Surgical History:  Procedure Laterality Date  . foramen magnum ependymoma surgery  2003   Dr Rita Ohara  . JOINT REPLACEMENT Bilateral   . NASAL SINUS SURGERY     1973   . TONSILLECTOMY    . TOTAL KNEE ARTHROPLASTY     L 2008, R 2009, R 2016- Dr Maureen Ralphs  . TOTAL KNEE REVISION Right 03/05/2015   Procedure: RIGHT TOTAL KNEE ARTHROPLASTY REVISION;  Surgeon: Gaynelle Arabian, MD;  Location: [facility];  Service: Orthopedics;  Laterality: Right;    Allergies  Allergen Reactions  . Erythromycin Diarrhea and Nausea And Vomiting    Family History  Problem Relation Age of Onset  . Depression Mother   . Hypertension Mother   . Stroke Mother 76  . Diabetes Mother   . Diabetes Father   . Hyperlipidemia Father   . Hypertension Father   . Crohn's disease Maternal Uncle   . Diabetes Other   . Cancer Brother        Prostate  . Diabetes Brother   . Heart disease Brother   . Hyperlipidemia Brother   . Hypertension Brother   . Diabetes Brother   . Heart disease Brother        Heart Disease before age 48  . Heart attack Brother   . Stroke Brother    X's 2  . Hyperlipidemia Brother   . Hypertension Brother   . Colon cancer Neg Hx      Social History   Socioeconomic History  . Marital status: Single    Spouse name: Not on file  . Number of children: Not on file  . Years of education: Not on file  . Highest education level: Not on file  Social Needs  . Financial resource strain: Not on file  . Food insecurity - worry: Not on file  . Food insecurity - inability: Not on file  . Transportation needs - medical: Not on file  . Transportation needs - non-medical: Not on file  Occupational History  . Not on file  Tobacco Use  . Smoking status: Former Smoker    Packs/day: 1.00    Years: 20.00    Pack years: 20.00    Types: Cigarettes    Last attempt to quit: 12/13/1988    Years since quitting: 29.1  . Smokeless tobacco: Never Used  Substance and Sexual Activity  . Alcohol use: Yes    Alcohol/week: 0.0 oz  Comment: rarely  . Drug use: No  . Sexual activity: Not on file  Other Topics Concern  . Not on file  Social History Narrative  . Not on file     PHYSICAL EXAM:  VS: There were no vitals taken for this visit. Physical Exam Gen: NAD, alert, cooperative with exam, well-appearing ENT: normal lips, normal nasal mucosa,  Eye: normal EOM, normal conjunctiva and lids CV:  no edema, +2 pedal pulses   Resp: no accessory muscle use, non-labored,  GI: no masses or tenderness, no hernia  Skin: no rashes, no areas of induration  Neuro: normal tone, normal sensation to touch Psych:  normal insight, alert and oriented MSK:  ***      ASSESSMENT & PLAN:   No problem-specific Assessment & Plan notes found for this encounter.

## 2018-02-08 ENCOUNTER — Ambulatory Visit (INDEPENDENT_AMBULATORY_CARE_PROVIDER_SITE_OTHER): Payer: Medicare Other | Admitting: Internal Medicine

## 2018-02-08 ENCOUNTER — Encounter: Payer: Self-pay | Admitting: Internal Medicine

## 2018-02-08 VITALS — BP 130/76 | HR 88 | Temp 97.5°F | Resp 16 | Wt 257.0 lb

## 2018-02-08 DIAGNOSIS — M542 Cervicalgia: Secondary | ICD-10-CM

## 2018-02-08 NOTE — Patient Instructions (Addendum)
  Medications reviewed and updated.  Continue your current medications.    A referral was ordered for physical therapy

## 2018-02-08 NOTE — Assessment & Plan Note (Signed)
Already on oxycodone, flexeril and prednisone Has applied lidocaine topically Has tried heat/ice Will continue above Will refer to PT

## 2018-02-08 NOTE — Progress Notes (Signed)
Subjective:    Patient ID: [name], female    DOB: March 12, 1951, 66 y.o.   MRN: [id number]  HPI The patient is here for an acute visit.  Neck pain:  Her pain started 5 days ago.  She was seen here two days ago.  She thinks she slept on it wrong - she woke up with the pain. The pain is across her lower head/upper neck region.  The pain is constant.  She denies radiating pain, numbness and tingling.  She denies new weakness in her arms/hands since the pain started.    She is on chronic oxycodone and flexeril every 6 hours, which she has been taking.  She was started on prednisone 20 mg daily two days ago and has only taken one dose.  She has used lidocaine ointment on this area.  She has tried ice and heat.    Medications and allergies reviewed with patient and updated if appropriate.  Patient Active Problem List   Diagnosis Date Noted  . Neck pain 02/08/2018  . CKD (chronic kidney disease) 10/13/2017  . 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 OA (osteoarthritis) of knee 03/05/2015  . Rib pain on left side 02/27/2015  . URI, acute 09/17/2013  . Swelling of limb-Bilateral leg Left > than right 07/11/2013  . Numbness-Left foot 07/11/2013  . Chronic venous insufficiency 04/19/2013  . Left hand pain 02/09/2013  . Right shoulder pain 09/12/2012  . Edema 04/24/2012  . Grief 11/10/2011  . ABSCESS, TOOTH 02/03/2011  . OSTEOPENIA 10/28/2010  . Fever 08/06/2010  . HYPERKALEMIA 07/15/2010  . ARTHRALGIA 04/08/2010  . FATIGUE 04/08/2010  . XEROSTOMIA 02/04/2010  . ECZEMA 10/29/2009  . TOBACCO USE, QUIT 10/29/2009  . Anemia 06/18/2009  . 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 Diarrhea 06/18/2009  . OPACITY, VITREOUS HUMOR 01/15/2009  . COLITIS 10/16/2008  . KNEE PAIN 04/10/2008  . HYPERCHOLESTEROLEMIA 01/10/2008  . Anxiety state 11/08/2007  . Depression 11/08/2007  . Osteoarthritis 11/08/2007  . LOW BACK PAIN 11/08/2007  . Diabetes  mellitus type 2, controlled ([place]) 10/07/2007  . Essential hypertension 10/07/2007  . GERD 10/07/2007  . 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 amoxicillin (AMOXIL) 500 MG capsule TAKE 4 CAPSULES BY MOUTH 1 HOUR PRIOR TO DENTAL PROCEDURE/CLEANING 16 capsule 1  . blood glucose meter kit and supplies Dispense based on patient and insurance preference. Used to check blood sugar daily, DX: E11.9 OneTouch 1 each 0  . buPROPion (WELLBUTRIN XL) 150 MG 24 hr tablet Take 1 tablet (150 mg total) by mouth 2 (two) times daily. 180 tablet 3  . carvedilol (COREG) 25 MG tablet TAKE 1/2 TABLET BY MOUTH 2 TIMES DAILY WITH A MEAL. 90 tablet 3  . cyclobenzaprine (FLEXERIL) 10 MG tablet Take 3 tablets (30 mg total) by mouth at bedtime. 270 tablet 0  . DULoxetine (CYMBALTA) 60 MG capsule Take 1 capsule (60 mg total) by mouth at bedtime. 90 capsule 3  . furosemide (LASIX) 40 MG tablet Take 1-2 tablets (40-80 mg total) by mouth daily. 180 tablet 3  . glucose blood (ONE TOUCH TEST STRIPS) test strip Used to check blood sugar daily, DX: E11.9 100 each 3  . Lancets (ONETOUCH ULTRASOFT) lancets Used to check blood sugar daily, DX: E11.9 100 each 3  . lidocaine (XYLOCAINE) 5 % ointment Apply topically 3 (three) times daily as needed. 100 g 3  .  LORazepam (ATIVAN) 2 MG tablet Take 0.5 tablets (1 mg total) by mouth every 6 (six) hours as needed for anxiety. 45 tablet 1  . Multiple Vitamin (MULTIVITAMIN WITH MINERALS) TABS tablet Take 1 tablet by mouth daily.    . Omega-3 300 MG CAPS Take 300 mg by mouth at bedtime.    Marland Kitchen oxyCODONE (ROXICODONE) 15 MG immediate release tablet Take 1 tablet (15 mg total) by mouth every 6 (six) hours as needed for pain. 120 tablet 0  . OxyCODONE ER (XTAMPZA ER) 18 MG C12A Take 1 tablet by mouth 2 (two) times daily. 60 each 0  . pantoprazole (PROTONIX) 40 MG tablet Take 1 tablet (40 mg total) by mouth daily. 90  tablet 3  . pilocarpine (SALAGEN) 5 MG tablet Take 1 tablet (5 mg total) by mouth 2 (two) times daily. 180 tablet 3  . pioglitazone (ACTOS) 15 MG tablet Take 1 tablet (15 mg total) by mouth daily. 90 tablet 3  . polyvinyl alcohol (LIQUIFILM TEARS) 1.4 % ophthalmic solution Place 1 drop into both eyes daily as needed for dry eyes.    . predniSONE (DELTASONE) 20 MG tablet Take 1 tablet (20 mg total) by mouth daily with breakfast. 5 tablet 0  . PSYLLIUM PO Take 1 capsule by mouth 2 (two) times daily.    . VESICARE 5 MG tablet Take 1 tablet (5 mg total) by mouth daily. 90 tablet 1  . Vitamin D, Cholecalciferol, 1000 UNITS TABS Take 1,000 Units by mouth daily.     No current facility-administered medications on file prior to visit.     Past Medical History:  Diagnosis Date  . Anemia   . Anxiety   . Colitis 2010   microscopic- Dr Henrene Pastor  . Depression   . Diabetes mellitus    type II  . GERD (gastroesophageal reflux disease)   . Headache   . Hypertension   . IBS (irritable bowel syndrome)   . LBP (low back pain)   . Neuropathy    feet bilat   . Osteoarthritis   . Osteopenia   . Pneumonia    hx of 2014   . Renal insufficiency 2011  . Sinusitis    currently being treated with antibiotic will complete 03/04/2015    Past Surgical History:  Procedure Laterality Date  . foramen magnum ependymoma surgery  2003   Dr Rita Ohara  . JOINT REPLACEMENT Bilateral   . NASAL SINUS SURGERY     1973   . TONSILLECTOMY    . TOTAL KNEE ARTHROPLASTY     L 2008, R 2009, R 2016- Dr Maureen Ralphs  . TOTAL KNEE REVISION Right 03/05/2015   Procedure: RIGHT TOTAL KNEE ARTHROPLASTY REVISION;  Surgeon: Gaynelle Arabian, MD;  Location: [facility];  Service: Orthopedics;  Laterality: Right;    Social History   Socioeconomic History  . Marital status: Single    Spouse name: None  . Number of children: None  . Years of education: None  . Highest education level: None  Social Needs  . Financial resource strain: None    . Food insecurity - worry: None  . Food insecurity - inability: None  . Transportation needs - medical: None  . Transportation needs - non-medical: None  Occupational History  . None  Tobacco Use  . Smoking status: Former Smoker    Packs/day: 1.00    Years: 20.00    Pack years: 20.00    Types: Cigarettes    Last attempt to quit: 12/13/1988  Years since quitting: 29.1  . Smokeless tobacco: Never Used  Substance and Sexual Activity  . Alcohol use: Yes    Alcohol/week: 0.0 oz    Comment: rarely  . Drug use: No  . Sexual activity: None  Other Topics Concern  . None  Social History Narrative  . None    Family History  Problem Relation Age of Onset  . Depression Mother   . Hypertension Mother   . Stroke Mother 32  . Diabetes Mother   . Diabetes Father   . Hyperlipidemia Father   . Hypertension Father   . Crohn's disease Maternal Uncle   . Diabetes Other   . Cancer Brother        Prostate  . Diabetes Brother   . Heart disease Brother   . Hyperlipidemia Brother   . Hypertension Brother   . Diabetes Brother   . Heart disease Brother        Heart Disease before age 19  . Heart attack Brother   . Stroke Brother        X's 2  . Hyperlipidemia Brother   . Hypertension Brother   . Colon cancer Neg Hx     Review of Systems  Constitutional: Negative for chills and fever.  Musculoskeletal: Positive for myalgias and neck pain.  Skin: Negative for color change.  Neurological: Positive for headaches. Negative for weakness (nothing new associated with this current pain) and numbness (nothing new associated with this current pain).       Objective:   Vitals:   02/08/18 1338  BP: 130/76  Pulse: 88  Resp: 16  Temp: (!) 97.5 F (36.4 C)  SpO2: 94%   Wt Readings from Last 3 Encounters:  02/08/18 257 lb (116.6 kg)  02/06/18 259 lb 0.6 oz (117.5 kg)  12/20/17 259 lb (117.5 kg)   Body mass index is 47.01 kg/m.   Physical Exam  Constitutional: She appears  well-developed and well-nourished. No distress.  Appears overmedicated  HENT:  Head: Normocephalic and atraumatic.  Musculoskeletal:  Tenderness in proximal neck  - increase pain with movement; normal sensation in neck region  Skin: Skin is warm and dry. No rash noted. She is not diaphoretic. No erythema.         Assessment & Plan:    See Problem List for Assessment and Plan of chronic medical problems.

## 2018-02-21 DIAGNOSIS — M26609 Unspecified temporomandibular joint disorder, unspecified side: Secondary | ICD-10-CM | POA: Diagnosis not present

## 2018-02-21 DIAGNOSIS — E119 Type 2 diabetes mellitus without complications: Secondary | ICD-10-CM | POA: Diagnosis not present

## 2018-02-21 DIAGNOSIS — H6502 Acute serous otitis media, left ear: Secondary | ICD-10-CM | POA: Diagnosis not present

## 2018-02-21 MED FILL — oxyCODONE HCL 15 MG TABS: 15 | 30 days supply | Qty: 120 | Fill #0

## 2018-02-21 MED FILL — CEPHALEXIN 500 MG CAPSULE: 500 | 10 days supply | Qty: 30 | Fill #0

## 2018-02-22 ENCOUNTER — Other Ambulatory Visit: Payer: Self-pay | Admitting: Internal Medicine

## 2018-02-22 MED FILL — XTAMPZA ER 18 MG C12A: 18 | 30 days supply | Qty: 60 | Fill #0

## 2018-02-22 NOTE — Telephone Encounter (Signed)
Copied from [place] [id number]. Topic: Quick Communication - See Telephone Encounter >> Feb 22, 2018  8:50 AM Ahmed Prima L wrote: CRM for notification. See Telephone encounter for:   02/22/18.  Elmo Putt from [facility] called and said that Dr Alain Marion gave her a written script in January for OxyCODONE ER [facility] ER) 18 MG C12A and it says fill on or after march 28th. Patient told them that she did not ever fill it for February. Pharmacy said they did fill it on 2/5. Pharmacy said she is due for it. She wants to know if the office highlighted it in pink and why it says fill on or after 3/28. She just needs the ok to fill the written script that she Dr Alain Marion  [place]

## 2018-02-22 NOTE — Telephone Encounter (Signed)
Patient was given 3 Rxs on 12/20/17 and one included for 02/09/18 or later, pharmacy notified

## 2018-02-22 NOTE — Telephone Encounter (Signed)
Rx request refill Oxycodone ER  LOV: 02/08/18     PCP: Plotnikov   Pharmacy: [facility]

## 2018-03-01 DIAGNOSIS — Z01419 Encounter for gynecological examination (general) (routine) without abnormal findings: Secondary | ICD-10-CM | POA: Diagnosis not present

## 2018-03-01 DIAGNOSIS — Z1231 Encounter for screening mammogram for malignant neoplasm of breast: Secondary | ICD-10-CM | POA: Diagnosis not present

## 2018-03-17 ENCOUNTER — Telehealth: Payer: Self-pay | Admitting: Internal Medicine

## 2018-03-17 NOTE — Telephone Encounter (Signed)
Copied from [place] [id number]. Topic: Quick Communication - See Telephone Encounter >> Mar 17, 2018 12:56 PM Hewitt Shorts wrote: CRM for notification. See Telephone encounter for: 03/17/18. Pharmacy is needing to clarify a rx hack from 12/2017 for butrotion  [facility]  [phone]

## 2018-03-17 NOTE — Telephone Encounter (Signed)
Routing to dr plotnikov---please verify if patient is supposed to take "once" or "twice" daily---current chart says she should take twice daily, is that correct?

## 2018-03-17 NOTE — Telephone Encounter (Signed)
[facility] wants to verify that pt. Should be on Wellbutrin XL 150 mg 1 po twice a day. State it is usually ordered once a day. Please advise.

## 2018-03-20 ENCOUNTER — Telehealth: Payer: Self-pay | Admitting: Internal Medicine

## 2018-03-20 ENCOUNTER — Other Ambulatory Visit (INDEPENDENT_AMBULATORY_CARE_PROVIDER_SITE_OTHER): Payer: Medicare Other

## 2018-03-20 ENCOUNTER — Ambulatory Visit (INDEPENDENT_AMBULATORY_CARE_PROVIDER_SITE_OTHER): Payer: Medicare Other | Admitting: Internal Medicine

## 2018-03-20 ENCOUNTER — Encounter: Payer: Self-pay | Admitting: Internal Medicine

## 2018-03-20 DIAGNOSIS — I10 Essential (primary) hypertension: Secondary | ICD-10-CM | POA: Diagnosis not present

## 2018-03-20 DIAGNOSIS — G8929 Other chronic pain: Secondary | ICD-10-CM

## 2018-03-20 DIAGNOSIS — M545 Low back pain, unspecified: Secondary | ICD-10-CM

## 2018-03-20 DIAGNOSIS — R5382 Chronic fatigue, unspecified: Secondary | ICD-10-CM

## 2018-03-20 DIAGNOSIS — F411 Generalized anxiety disorder: Secondary | ICD-10-CM

## 2018-03-20 DIAGNOSIS — N184 Chronic kidney disease, stage 4 (severe): Secondary | ICD-10-CM

## 2018-03-20 DIAGNOSIS — R296 Repeated falls: Secondary | ICD-10-CM

## 2018-03-20 DIAGNOSIS — E119 Type 2 diabetes mellitus without complications: Secondary | ICD-10-CM | POA: Diagnosis not present

## 2018-03-20 LAB — HEMOGLOBIN A1C: Hgb A1c MFr Bld: 7.1 % — ABNORMAL HIGH (ref 4.6–6.5)

## 2018-03-20 LAB — BASIC METABOLIC PANEL
BUN: 35 mg/dL — ABNORMAL HIGH (ref 6–23)
CO2: 29 mEq/L (ref 19–32)
Calcium: 9.3 mg/dL (ref 8.4–10.5)
Chloride: 96 mEq/L (ref 96–112)
Creatinine, Ser: 2 mg/dL — ABNORMAL HIGH (ref 0.40–1.20)
GFR: 26.49 mL/min — ABNORMAL LOW (ref >60.00)
Glucose, Bld: 156 mg/dL — ABNORMAL HIGH (ref 70–99)
Potassium: 3.6 mEq/L (ref 3.5–5.1)
Sodium: 135 mEq/L (ref 135–145)

## 2018-03-20 MED ORDER — OXYCODONE HCL 15 MG PO TABS: 15.0000 mg | ORAL_TABLET | Freq: Four times a day (QID) | ORAL | 0 refills | Status: DC | PRN

## 2018-03-20 MED ORDER — OXYCODONE ER 18 MG PO C12A: 1.0000 | EXTENDED_RELEASE_CAPSULE | Freq: Two times a day (BID) | ORAL | 0 refills | Status: DC

## 2018-03-20 MED ORDER — LORAZEPAM 2 MG PO TABS: 1.0000 mg | ORAL_TABLET | Freq: Four times a day (QID) | ORAL | 1 refills | Status: DC | PRN

## 2018-03-20 NOTE — Assessment & Plan Note (Signed)
Monitoring labs 

## 2018-03-20 NOTE — Progress Notes (Signed)
Subjective:  Patient ID: [name], female    DOB: [birth year]  Age: 67 y.o. MRN: [id number]  CC: No chief complaint on file.   HPI [name] presents for OA, chronic bain, DM, CRF C/o falls x3 in the past 2-3 wks No c/o being foggy, drugged No sx's CVA Taking less pain meds if pain allows  Outpatient Medications Prior to Visit  Medication Sig Dispense Refill  . amoxicillin (AMOXIL) 500 MG capsule TAKE 4 CAPSULES BY MOUTH 1 HOUR PRIOR TO DENTAL PROCEDURE/CLEANING 16 capsule 1  . blood glucose meter kit and supplies Dispense based on patient and insurance preference. Used to check blood sugar daily, DX: E11.9 OneTouch 1 each 0  . buPROPion (WELLBUTRIN XL) 150 MG 24 hr tablet Take 1 tablet (150 mg total) by mouth 2 (two) times daily. 180 tablet 3  . carvedilol (COREG) 25 MG tablet TAKE 1/2 TABLET BY MOUTH 2 TIMES DAILY WITH A MEAL. 90 tablet 3  . cyclobenzaprine (FLEXERIL) 10 MG tablet Take 3 tablets (30 mg total) by mouth at bedtime. 270 tablet 0  . DULoxetine (CYMBALTA) 60 MG capsule Take 1 capsule (60 mg total) by mouth at bedtime. 90 capsule 3  . furosemide (LASIX) 40 MG tablet Take 1-2 tablets (40-80 mg total) by mouth daily. 180 tablet 3  . glucose blood (ONE TOUCH TEST STRIPS) test strip Used to check blood sugar daily, DX: E11.9 100 each 3  . Lancets (ONETOUCH ULTRASOFT) lancets Used to check blood sugar daily, DX: E11.9 100 each 3  . lidocaine (XYLOCAINE) 5 % ointment Apply topically 3 (three) times daily as needed. 100 g 3  . LORazepam (ATIVAN) 2 MG tablet Take 0.5 tablets (1 mg total) by mouth every 6 (six) hours as needed for anxiety. 45 tablet 1  . Multiple Vitamin (MULTIVITAMIN WITH MINERALS) TABS tablet Take 1 tablet by mouth daily.    . Omega-3 300 MG CAPS Take 300 mg by mouth at bedtime.    Marland Kitchen oxyCODONE (ROXICODONE) 15 MG immediate release tablet Take 1 tablet (15 mg total) by mouth every 6 (six) hours as needed for pain. 120 tablet 0  . OxyCODONE ER (XTAMPZA ER)  18 MG C12A Take 1 tablet by mouth 2 (two) times daily. 60 each 0  . pantoprazole (PROTONIX) 40 MG tablet Take 1 tablet (40 mg total) by mouth daily. 90 tablet 3  . pilocarpine (SALAGEN) 5 MG tablet Take 1 tablet (5 mg total) by mouth 2 (two) times daily. 180 tablet 3  . pioglitazone (ACTOS) 15 MG tablet Take 1 tablet (15 mg total) by mouth daily. 90 tablet 3  . polyvinyl alcohol (LIQUIFILM TEARS) 1.4 % ophthalmic solution Place 1 drop into both eyes daily as needed for dry eyes.    . PSYLLIUM PO Take 1 capsule by mouth 2 (two) times daily.    . VESICARE 5 MG tablet Take 1 tablet (5 mg total) by mouth daily. 90 tablet 1  . Vitamin D, Cholecalciferol, 1000 UNITS TABS Take 1,000 Units by mouth daily.    . predniSONE (DELTASONE) 20 MG tablet Take 1 tablet (20 mg total) by mouth daily with breakfast. (Patient not taking: Reported on 03/20/2018) 5 tablet 0   No facility-administered medications prior to visit.     ROS Review of Systems  Constitutional: Positive for fatigue. Negative for activity change, appetite change, chills and unexpected weight change.  HENT: Negative for congestion, mouth sores and sinus pressure.   Eyes: Negative for visual disturbance.  Respiratory: Negative for cough and chest tightness.   Gastrointestinal: Negative for abdominal pain and nausea.  Genitourinary: Negative for difficulty urinating, frequency and vaginal pain.  Musculoskeletal: Positive for arthralgias, back pain and gait problem.  Skin: Negative for pallor and rash.  Neurological: Positive for dizziness and weakness. Negative for tremors, numbness and headaches.  Psychiatric/Behavioral: Negative for confusion and sleep disturbance.  ataxic  Objective:  BP 126/72 (BP Location: Left Arm, Patient Position: Sitting, Cuff Size: Large)   Pulse 75   Temp 98.4 F (36.9 C) (Oral)   Ht '5\' 2"'$  (1.575 m)   Wt 256 lb (116.1 kg)   SpO2 98%   BMI 46.82 kg/m   BP Readings from Last 3 Encounters:  03/20/18 126/72    02/08/18 130/76  02/06/18 138/78    Wt Readings from Last 3 Encounters:  03/20/18 256 lb (116.1 kg)  02/08/18 257 lb (116.6 kg)  02/06/18 259 lb 0.6 oz (117.5 kg)    Physical Exam  Constitutional: She appears well-developed. No distress.  HENT:  Head: Normocephalic.  Right Ear: External ear normal.  Left Ear: External ear normal.  Nose: Nose normal.  Mouth/Throat: Oropharynx is clear and moist.  Eyes: Pupils are equal, round, and reactive to light. Conjunctivae are normal. Right eye exhibits no discharge. Left eye exhibits no discharge.  Neck: Normal range of motion. Neck supple. No JVD present. No tracheal deviation present. No thyromegaly present.  Cardiovascular: Normal rate, regular rhythm and normal heart sounds.  Pulmonary/Chest: No stridor. No respiratory distress. She has no wheezes.  Abdominal: Soft. Bowel sounds are normal. She exhibits no distension and no mass. There is no tenderness. There is no rebound and no guarding.  Musculoskeletal: She exhibits tenderness. She exhibits no edema.  Lymphadenopathy:    She has no cervical adenopathy.  Neurological: She displays normal reflexes. No cranial nerve deficit. She exhibits normal muscle tone. Coordination abnormal.  Skin: No rash noted. No erythema.  Psychiatric: Her behavior is normal. Judgment and thought content normal.  tearful Obese Ataxic (as before) LS, knees - tender  Lab Results  Component Value Date   WBC 8.9 10/13/2017   HGB 10.2 (L) 10/13/2017   HCT 31.6 (L) 10/13/2017   PLT 239.0 10/13/2017   GLUCOSE 151 (H) 12/20/2017   CHOL 215 (H) 09/25/2014   TRIG 154.0 (H) 09/25/2014   HDL 46.00 09/25/2014   LDLCALC 138 (H) 09/25/2014   ALT 10 08/20/2016   AST 12 08/20/2016   NA 136 12/20/2017   K 4.2 12/20/2017   CL 94 (L) 12/20/2017   CREATININE 2.27 (H) 12/20/2017   BUN 42 (H) 12/20/2017   CO2 34 (H) 12/20/2017   TSH 1.64 07/25/2013   INR 1.04 02/26/2015   HGBA1C 6.7 (H) 12/20/2017    Dg  Cervical Spine 2 Or 3 Views  Result Date: 02/06/2018 CLINICAL DATA:  Neck pain for 3 days. EXAM: CERVICAL SPINE - 2-3 VIEW COMPARISON:  None. FINDINGS: There is no evidence of cervical spine fracture or prevertebral soft tissue swelling. Alignment is normal. There are degenerative joint changes with narrowed joint space and osteophyte formation in the mid to lower cervical spine. IMPRESSION: No acute fracture or dislocation. Degenerative joint changes of cervical spine. Electronically Signed   By: Abelardo Diesel M.D.   On: 02/06/2018 20:18    Assessment & Plan:   There are no diagnoses linked to this encounter. I have discontinued Everlean Alstrom. Carano's predniSONE. I am also having her maintain her polyvinyl alcohol, Vitamin  D (Cholecalciferol), multivitamin with minerals, Omega-3, PSYLLIUM PO, buPROPion, carvedilol, cyclobenzaprine, DULoxetine, furosemide, glucose blood, onetouch ultrasoft, lidocaine, LORazepam, pantoprazole, pilocarpine, pioglitazone, oxyCODONE, oxyCODONE ER, blood glucose meter kit and supplies, amoxicillin, and VESICARE.  No orders of the defined types were placed in this encounter.    Follow-up: No follow-ups on file.  [name], MD

## 2018-03-20 NOTE — Telephone Encounter (Signed)
Copied from Mobile [phone]. Topic: General - Other >> Mar 20, 2018 11:18 AM Cecelia Byars, NT wrote: Reason for CRM Debra from [facility] called and said the patient has been having episodes of staggerng around not blood pressure related or blood sugar related according to the patient , she says this has been going on at least 2 months , she is unsyure of the neuroptahy she has had 2 falls has and more in March , she also has  concerns about continuing to take the DULoxetine ,she wants to know if there is another option besides this she is also a retired Marine scientist  ,please call Debra at [facility] x [phone]

## 2018-03-20 NOTE — Assessment & Plan Note (Signed)
F/u Dr Dwyane Dee Glimepiride, Actos

## 2018-03-20 NOTE — Assessment & Plan Note (Signed)
Potential benefits of a long term opioids use as well as potential risks (i.e. addiction risk, apnea etc) and complications (i.e. Somnolence, constipation and others) were explained to the patient and were aknowledged. Oxycontin is not covered 3/18 [name] ER

## 2018-03-20 NOTE — Assessment & Plan Note (Signed)
Multifactorial - discussed

## 2018-03-20 NOTE — Assessment & Plan Note (Signed)
Lorazepam prn  Potential benefits of a long term benzodiazepines  use as well as potential risks  and complications were explained to the patient and were aknowledged.  

## 2018-03-20 NOTE — Assessment & Plan Note (Addendum)
?  etiology - multifactorial PT ref Loose wt Cut back on pain meds if possible

## 2018-03-20 NOTE — Telephone Encounter (Signed)
I have tried to reach patient [name] all day today---her voicemail says you have to keep calling her back --she doesn't want you to leave any messages---I will continue trying to reach patient to see how she is taking this medicine

## 2018-03-20 NOTE — Telephone Encounter (Signed)
Pt being seen at 300

## 2018-03-20 NOTE — Telephone Encounter (Signed)
pls ask the pt how she is taking it Thx

## 2018-03-20 NOTE — Assessment & Plan Note (Signed)
BP Readings from Last 3 Encounters:  03/20/18 126/72  02/08/18 130/76  02/06/18 138/78

## 2018-03-21 ENCOUNTER — Other Ambulatory Visit: Payer: Self-pay | Admitting: Internal Medicine

## 2018-03-21 MED ORDER — BUPROPION HCL ER (XL) 150 MG PO TB24: 300.0000 mg | ORAL_TABLET | Freq: Every day | ORAL | 3 refills | Status: DC

## 2018-03-21 NOTE — Telephone Encounter (Signed)
Routing to dr plotnikov----patient used to only take one 300mg  tablet once daily, but then [facility] changed it to 150mg  tablets, so she is now taking two tablets daily (to equal 300mg  daily)

## 2018-03-21 NOTE — Telephone Encounter (Signed)
Ok Thx 

## 2018-03-22 LAB — PMP SCREEN PROFILE (10S), URINE
Amphetamine Scrn, Ur: NEGATIVE ng/mL
BARBITURATE SCREEN URINE: NEGATIVE ng/mL
BENZODIAZEPINE SCREEN, URINE: NEGATIVE ng/mL
CANNABINOIDS UR QL SCN: NEGATIVE ng/mL
Cocaine (Metab) Scrn, Ur: NEGATIVE ng/mL
Creatinine(Crt), U: 47.8 mg/dL (ref 20.0–300.0)
Methadone Screen, Urine: NEGATIVE ng/mL
OXYCODONE+OXYMORPHONE UR QL SCN: POSITIVE ng/mL — AB
Opiate Scrn, Ur: POSITIVE ng/mL — AB
Ph of Urine: 5.6 (ref 4.5–8.9)
Phencyclidine Qn, Ur: NEGATIVE ng/mL
Propoxyphene Scrn, Ur: NEGATIVE ng/mL

## 2018-03-22 NOTE — Telephone Encounter (Signed)
Talked with brent/pharmacy---he will start sending one tablet 300mg  wellbutrin if patient's insurance will cover, otherwise he will send 150mg  tablets (patient to take one tablet twice daily to equal total of 300mg  daily)---patient is aware

## 2018-03-23 DIAGNOSIS — M25511 Pain in right shoulder: Secondary | ICD-10-CM | POA: Diagnosis not present

## 2018-03-23 DIAGNOSIS — S40011D Contusion of right shoulder, subsequent encounter: Secondary | ICD-10-CM | POA: Diagnosis not present

## 2018-03-27 MED FILL — XTAMPZA ER 18 MG C12A: 18 | 30 days supply | Qty: 60 | Fill #0

## 2018-03-27 MED FILL — oxyCODONE HCL 15 MG TABS: 15 | 30 days supply | Qty: 120 | Fill #0

## 2018-03-30 ENCOUNTER — Other Ambulatory Visit: Payer: Self-pay

## 2018-03-30 ENCOUNTER — Ambulatory Visit: Payer: Medicare Other | Attending: Internal Medicine | Admitting: Physical Therapy

## 2018-03-30 ENCOUNTER — Encounter: Payer: Self-pay | Admitting: Physical Therapy

## 2018-03-30 DIAGNOSIS — R42 Dizziness and giddiness: Secondary | ICD-10-CM

## 2018-03-30 DIAGNOSIS — H8111 Benign paroxysmal vertigo, right ear: Secondary | ICD-10-CM | POA: Diagnosis not present

## 2018-03-30 DIAGNOSIS — M6281 Muscle weakness (generalized): Secondary | ICD-10-CM | POA: Diagnosis not present

## 2018-03-30 DIAGNOSIS — R2689 Other abnormalities of gait and mobility: Secondary | ICD-10-CM | POA: Diagnosis not present

## 2018-03-30 DIAGNOSIS — R2681 Unsteadiness on feet: Secondary | ICD-10-CM

## 2018-03-31 NOTE — Therapy (Signed)
[facility] [address] [place], [place], [address] Phone: [phone]   Fax:  [phone]  Physical Therapy Evaluation  Patient Details  Name: [name] MRN: [id number] Date of Birth: 05/14/1951 Referring Provider: Cassandria Anger, MD   Encounter Date: 03/30/2018  PT End of Session - 03/31/18 0821    Visit Number  1    Number of Visits  13 eval plus 12 visits    Date for PT Re-Evaluation  06/28/18    Authorization Type  [employer] MCR    Authorization Time Period  03/30/18 to 06/28/18    PT Start Time  1340    PT Stop Time  1435    PT Time Calculation (min)  55 min    Activity Tolerance  Patient tolerated treatment well    Behavior During Therapy  Anxious labile       Past Medical History:  Diagnosis Date  . Anemia   . Anxiety   . Colitis 2010   microscopic- Dr Henrene Pastor  . Depression   . Diabetes mellitus    type II  . GERD (gastroesophageal reflux disease)   . Headache   . Hypertension   . IBS (irritable bowel syndrome)   . LBP (low back pain)   . Neuropathy    feet bilat   . Osteoarthritis   . Osteopenia   . Pneumonia    hx of 2014   . Renal insufficiency 2011  . Sinusitis    currently being treated with antibiotic will complete 03/04/2015    Past Surgical History:  Procedure Laterality Date  . foramen magnum ependymoma surgery  2003   Dr Rita Ohara  . JOINT REPLACEMENT Bilateral   . NASAL SINUS SURGERY     1973   . TONSILLECTOMY    . TOTAL KNEE ARTHROPLASTY     L 2008, R 2009, R 2016- Dr Maureen Ralphs  . TOTAL KNEE REVISION Right 03/05/2015   Procedure: RIGHT TOTAL KNEE ARTHROPLASTY REVISION;  Surgeon: Gaynelle Arabian, MD;  Location: [facility];  Service: Orthopedics;  Laterality: Right;    There were no vitals filed for this visit.   Subjective Assessment - 03/30/18 1336    Subjective  I've had 3-4 falls in ~4 weeks. I have ben staggering. Not having dizziness when she feels off balance. I can see  when I'm going to fall and I can't stop it. 2 falls have been related to changing levels (steps, or step up in restaurant). Most recently I was with 56+ yr old aunt and went to step up on a curb and i could feel it was going to happen. It's a feeling in my head. I fell forward (shows scraped rt knee and elbow)    Pertinent History  frequent falls, DM, neuropathy, HTN, knee OA, LBP, bil TKR, broke Lt foot, craniotomy 2003 (has been labile ever since)    Patient Stated Goals  figure out why she is falling and avoid falling; doesn't want to break anything         [facility] PT Assessment - 03/31/18 0001      Assessment   Medical Diagnosis  falls     Referring Provider  Plotnikov, Evie Lacks, MD    Onset Date/Surgical Date  -- past month balance has been the worst; ongoing for years    Prior Therapy  for TKR      Precautions   Precautions  Fall      Restrictions   Weight Bearing Restrictions  No      Balance Screen   Has the patient fallen in the past 6 months  Yes    How many times?  4    Has the patient had a decrease in activity level because of a fear of falling?   No    Is the patient reluctant to leave their home because of a fear of falling?   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)    Leisure  travel, reading      Cognition   Overall Cognitive Status  No family/caregiver present to determine baseline cognitive functioning very labile    Behaviors  Lability frequent crying; reports present since craniotomy      Observation/Other Assessments   Observations  obese; drifts/staggers as walking across lobby      Sensation    Light Touch  Impaired Detail    Light Touch Impaired Details  Impaired RLE;Impaired LLE    Proprioception  -- intact bil ankles and knees    Additional Comments  loss of protective sensation left toes 1-4; diminished remainder of left foot (of note, pt found to have crumbled cookie in left shoe and had not felt anything was in her shoe; diminished throughout rt foot but no loss of protective sensation       AROM   Overall AROM   Within functional limits for tasks performed bi LEs      Strength   Overall Strength  Deficits    Overall Strength Comments  hip flexion 3+, knee extension 4, flexion 3+, ankle DF 5      Transfers   Transfers  Sit to Stand;Stand to Sit    Sit to Stand  6: Modified independent (Device/Increase time)    Stand to Sit  6: Modified independent (Device/Increase time)      Ambulation/Gait   Ambulation/Gait  Yes    Ambulation/Gait Assistance  5: Supervision    Ambulation Distance (Feet)  80 Feet 120    Assistive device  None    Gait Pattern  Step-through pattern;Decreased arm swing - right;Decreased arm swing - left;Decreased step length - right;Decreased step length - left;Right foot flat;Left foot flat;Lateral trunk lean to right;Lateral trunk lean to left;Decreased trunk rotation;Wide base of support    Gait velocity  20 ft/10.16 sec=1.97 ft/sec  <2.62 ft/sec indicative of higher fall risk in community amb    Stairs  Yes    Stairs Assistance  6: Modified independent (Device/Increase time)    Stair Management Technique  Two rails;Alternating pattern;Step to pattern;Forwards      Functional Gait  Assessment   Gait assessed   Yes    Gait Level Surface  Walks 20 ft, slow speed, abnormal gait pattern, evidence for imbalance or deviates 10-15 in outside of the 12 in walkway width. Requires more than 7 sec to ambulate 20 ft. 10.16 sec    Change in Gait Speed  Makes only minor adjustments to walking speed, or accomplishes a change in speed with significant  gait  deviations, deviates 10-15 in outside the 12 in walkway width, or changes speed but loses balance but is able to recover and continue walking.    Gait with Horizontal Head Turns  Performs head turns with moderate changes in gait velocity, slows down, deviates 10-15 in outside 12 in walkway width but recovers, can continue to walk.    Gait with Vertical Head Turns  Performs task with moderate change in gait velocity, slows down, deviates 10-15 in outside 12 in walkway width but recovers, can continue to walk.    Gait and Pivot Turn  Turns slowly, requires verbal cueing, or requires several small steps to catch balance following turn and stop    Step Over Obstacle  Is able to step over one shoe box (4.5 in total height) but must slow down and adjust steps to clear box safely. May require verbal cueing.    Gait with Narrow Base of Support  Ambulates less than 4 steps heel to toe or cannot perform without assistance.    Gait with Eyes Closed  Walks 20 ft, slow speed, abnormal gait pattern, evidence for imbalance, deviates 10-15 in outside 12 in walkway width. Requires more than 9 sec to ambulate 20 ft.    Ambulating Backwards  Walks 20 ft, slow speed, abnormal gait pattern, evidence for imbalance, deviates 10-15 in outside 12 in walkway width.    Steps  Two feet to a stair, must use rail.    Total Score  9    FGA comment:  < 19 = high risk fall           Vestibular Assessment - 03/31/18 0001      Symptom Behavior   Type of Dizziness  "Funny feeling in head"    Frequency of Dizziness  daily    Duration of Dizziness  seconds    Aggravating Factors  Activity in general;Supine to sit;Forward bending    Relieving Factors  Head stationary      Occulomotor Exam   Occulomotor Alignment  Normal    Spontaneous  Absent    Gaze-induced  Absent    Smooth Pursuits  Comment eyes drift off target in right visual field (x 3 attempts)      Vestibulo-Occular Reflex   VOR Cancellation  Normal    Comment   HIT ?positive to left (pt with diffculty relaxing for passive movement of head)          Objective measurements completed on examination: See above findings.              PT Education - 03/31/18 0817    Education provided  Yes    Education Details  3 systems involved in maintaining balance; ?vestibular deficit combined with neuropathy/decr sensation; use of walking pole likely could benefit her (sensory input via hand/UE, using pole to identify height of curb/step); try using pole vs her cane between now and next visit to determine if helpful    Person(s) Educated  Patient    Methods  Explanation    Comprehension  Verbalized understanding       PT Short Term Goals - 03/31/18 0849      PT SHORT TERM GOAL #1   Title  Patient will be independent with HEP (Target for all STGs 04/29/18)    Time  4    Period  Weeks    Status  New    Target Date  04/29/18      PT SHORT TERM GOAL #2  Title  Patient will improve FGA to >=14/30 to demonstrate decreasing fall risk.    Time  4    Period  Weeks    Status  New      PT SHORT TERM GOAL #3   Title  Patient will improve gait velocity to >2.4 ft/sec towards velocity indicative of safe community ambulation    Time  4    Period  Weeks    Status  New      PT SHORT TERM GOAL #4   Title  Patient will ascend/descend curb with LRAD modified independent.     Time  4    Period  Weeks    Status  New        PT Long Term Goals - 03/31/18 1478      PT LONG TERM GOAL #1   Title  Patient will be independent with updated HEP (Target all LTGs 05/29/18)    Time  8    Period  Weeks    Status  New    Target Date  05/29/18      PT LONG TERM GOAL #2   Title  Patient will improve FGA to >=19/30 to demonstrate lesser fall risk    Time  8    Period  Weeks    Status  New      PT LONG TERM GOAL #3   Title  Patient will improve gait velocity with LRAD to >=2.62 ft/sec (indicative of safe community ambulation)    Time  8    Period  Weeks     Status  New      PT LONG TERM GOAL #4   Title  Patient will ambulate modified independent with LRAD x 500 ft over outdoor paved surfaces, including slopes, ramps, curbs.     Time  8    Period  Weeks    Status  New             Plan - 03/31/18 2956    Clinical Impression Statement  Patient referred for OPPT due to imbalance and recent falls. Patient has history of imbalance and bil neuropathy, however in recent weeks she has fallen 4 times. Patient describes a "sense of knowing it's going to happen, but cannot do anything to stop it." She denies visual changes or dizziness. Patient with neuropathy in bil feet and some indications of vestibular deficits that are likely contributing to her falls. Anticipate she can benefit from PT to address the deficits listed below via the interventions listed below.     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, broke Lt foot; Personal factors- behavioral response to stress, lives alone (poor support system)    Clinical Presentation  Evolving    Clinical Presentation due to:  worsening balance and increased falls in recent 4 weeks of unknown cause    Clinical Decision Making  Moderate    Rehab Potential  Fair    Clinical Impairments Affecting Rehab Potential  neuropathy, h/o foramen magnum ependymoma with craniotomy    PT Frequency  Other (comment) 2x/week for 4 weeks; 1x/week for 4 weeks    PT Duration  8 weeks    PT Treatment/Interventions  ADLs/Self Care Home Management;Aquatic Therapy;Canalith Repostioning;Gait training;DME Instruction;Stair training;Functional mobility training;Therapeutic activities;Therapeutic exercise;Balance training;Neuromuscular re-education;Cognitive remediation;Patient/family education;Passive range of motion;Vestibular;Visual/perceptual remediation/compensation    PT Next Visit Plan  do SOT; assess proper use  of SPC vs  walking pole; ?check for BPPV; initiate HEP (?habituation seated forward bending)       Patient will benefit from skilled therapeutic intervention in order to improve the following deficits and impairments:  Abnormal gait, Decreased balance, Decreased mobility, Decreased knowledge of use of DME, Decreased strength, Increased edema, Dizziness, Difficulty walking, Impaired sensation, Impaired vision/preception, Obesity  Visit Diagnosis: Dizziness and giddiness - Plan: PT plan of care cert/re-cert  Muscle weakness (generalized) - Plan: PT plan of care cert/re-cert  Other abnormalities of gait and mobility - Plan: PT plan of care cert/re-cert  Unsteadiness on feet - Plan: PT plan of care cert/re-cert     Problem List Patient Active Problem List   Diagnosis Date Noted  . Falls frequently 03/20/2018  . Neck pain 02/08/2018  . CKD (chronic kidney disease) 10/13/2017  . 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 OA (osteoarthritis) of knee 03/05/2015  . Rib pain on left side 02/27/2015  . URI, acute 09/17/2013  . Swelling of limb-Bilateral leg Left > than right 07/11/2013  . Numbness-Left foot 07/11/2013  . Chronic venous insufficiency 04/19/2013  . Left hand pain 02/09/2013  . Right shoulder pain 09/12/2012  . Edema 04/24/2012  . Grief 11/10/2011  . ABSCESS, TOOTH 02/03/2011  . OSTEOPENIA 10/28/2010  . Fever 08/06/2010  . HYPERKALEMIA 07/15/2010  . ARTHRALGIA 04/08/2010  . Fatigue 04/08/2010  . XEROSTOMIA 02/04/2010  . ECZEMA 10/29/2009  . TOBACCO USE, QUIT 10/29/2009  . Anemia 06/18/2009  . 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 Diarrhea 06/18/2009  . OPACITY, VITREOUS HUMOR 01/15/2009  . COLITIS 10/16/2008  . KNEE PAIN 04/10/2008  . HYPERCHOLESTEROLEMIA 01/10/2008  . Anxiety state 11/08/2007  . Depression 11/08/2007  . Osteoarthritis 11/08/2007  . LOW BACK PAIN 11/08/2007  . Diabetes mellitus type 2, controlled ([place])  10/07/2007  . Essential hypertension 10/07/2007  . GERD 10/07/2007  . MICROALBUMINURIA 10/07/2007    Rexanne Mano, PT 03/31/2018, 9:03 AM  [facility] [address] [place], [place], [address] Phone: [phone]   Fax:  [phone]  Name: [name] MRN: [id number] Date of Birth: 12/29/1950

## 2018-04-03 ENCOUNTER — Ambulatory Visit: Payer: Medicare Other | Admitting: Physical Therapy

## 2018-04-03 ENCOUNTER — Encounter: Payer: Self-pay | Admitting: Physical Therapy

## 2018-04-03 DIAGNOSIS — R42 Dizziness and giddiness: Secondary | ICD-10-CM

## 2018-04-03 DIAGNOSIS — R2689 Other abnormalities of gait and mobility: Secondary | ICD-10-CM | POA: Diagnosis not present

## 2018-04-03 DIAGNOSIS — M6281 Muscle weakness (generalized): Secondary | ICD-10-CM | POA: Diagnosis not present

## 2018-04-03 DIAGNOSIS — R2681 Unsteadiness on feet: Secondary | ICD-10-CM | POA: Diagnosis not present

## 2018-04-03 DIAGNOSIS — H8111 Benign paroxysmal vertigo, right ear: Secondary | ICD-10-CM | POA: Diagnosis not present

## 2018-04-03 NOTE — Therapy (Addendum)
[facility] [address] [place], [place], [address] Phone: [phone]   Fax:  [phone]  Physical Therapy Treatment  Patient Details  Name: [name] MRN: [id number] Date of Birth: 14-Apr-1951 Referring Provider: Cassandria Anger, MD   Encounter Date: 04/03/2018  PT End of Session - 04/03/18 1558    Visit Number  2    Number of Visits  13 eval plus 12 visits    Date for PT Re-Evaluation  06/28/18    Authorization Type  [employer] MCR    Authorization Time Period  03/30/18 to 06/28/18    PT Start Time  0937    PT Stop Time  1020    PT Time Calculation (min)  43 min    Activity Tolerance  Patient tolerated treatment well    Behavior During Therapy  [facility] for tasks assessed/performed labile       Past Medical History:  Diagnosis Date  . Anemia   . Anxiety   . Colitis 2010   microscopic- Dr Henrene Pastor  . Depression   . Diabetes mellitus    type II  . GERD (gastroesophageal reflux disease)   . Headache   . Hypertension   . IBS (irritable bowel syndrome)   . LBP (low back pain)   . Neuropathy    feet bilat   . Osteoarthritis   . Osteopenia   . Pneumonia    hx of 2014   . Renal insufficiency 2011  . Sinusitis    currently being treated with antibiotic will complete 03/04/2015    Past Surgical History:  Procedure Laterality Date  . foramen magnum ependymoma surgery  2003   Dr Rita Ohara  . JOINT REPLACEMENT Bilateral   . NASAL SINUS SURGERY     1973   . TONSILLECTOMY    . TOTAL KNEE ARTHROPLASTY     L 2008, R 2009, R 2016- Dr Maureen Ralphs  . TOTAL KNEE REVISION Right 03/05/2015   Procedure: RIGHT TOTAL KNEE ARTHROPLASTY REVISION;  Surgeon: Gaynelle Arabian, MD;  Location: [facility];  Service: Orthopedics;  Laterality: Right;    There were no vitals filed for this visit.  Subjective Assessment - 04/03/18 0939    Subjective  I was a hermit all weekend and did not use my walking stick in my house. Reports in the past  when she has fallen, she has had to call EMS to help her get up.     Pertinent History  frequent falls, DM, neuropathy, HTN, knee OA, LBP, bil TKR, broke Lt foot, craniotomy 2003 (has been labile ever since)    Patient Stated Goals  figure out why she is falling and avoid falling; doesn't want to break anything    Currently in Pain?  Yes    Pain Score  4     Pain Location  Shoulder    Pain Orientation  Right    Pain Descriptors / Indicators  Aching    Pain Type  Chronic pain    Pain Onset  More than a month ago    Pain Frequency  Intermittent        Treatment--  Sensory Organization Testing=66% (below normal for age--compared to age/height normative value of 68%).     Patient demonstrated scores of:   85% for use of somatosensory feedback for balance (compared to age/height normative value of 90),  87 for use of visual feedback for balance (compared to age/height normative value of 83),   33 for use of  vestibular feedback for balance (compared to age/height normative value of 53).   COG readings were WNL however with tendency to right and slightly posterior.    Educated in exercises below for HEP:    Feet Partial Heel-Toe (Compliant Surface) Head Motion - Eyes Closed    Stand on compliant surface: ___pillow, folded blanket_____ with right foot partially in front of the other. Close eyes and hold for 30 seconds. Once you can do 30 seconds, then move head slowly, up and down x 10 reps, then left and right x 10 reps Do __1__ sessions per day.  Copyright  VHI. All rights reserved.                         PT Education - 04/03/18 1558    Education Details  see additions to HEP; results and meaning of SOT    Person(s) Educated  Patient    Methods  Explanation;Demonstration;Handout    Comprehension  Verbalized understanding;Returned demonstration;Need further instruction       PT Short Term Goals - 04/04/18 0936      PT SHORT TERM GOAL #1   Title   Patient will be independent with HEP (Target for all STGs 04/29/18)    Time  4    Period  Weeks    Status  New      PT SHORT TERM GOAL #2   Title  Patient will improve FGA to >=14/30 to demonstrate decreasing fall risk.    Time  4    Period  Weeks    Status  New      PT SHORT TERM GOAL #3   Title  Patient will improve gait velocity to >2.4 ft/sec towards velocity indicative of safe community ambulation    Time  4    Period  Weeks    Status  New      PT SHORT TERM GOAL #4   Title  Patient will ascend/descend curb with LRAD modified independent.     Time  4    Period  Weeks    Status  New      PT SHORT TERM GOAL #5   Title  Patient will perform floor to chair transfer with min assist and max vc's for technique.     Time  4    Period  Weeks    Status  New        PT Long Term Goals - 04/04/18 4580      PT LONG TERM GOAL #1   Title  Patient will be independent with updated HEP (Target all LTGs 05/29/18)    Time  8    Period  Weeks    Status  New      PT LONG TERM GOAL #2   Title  Patient will improve FGA to >=19/30 to demonstrate lesser fall risk    Time  8    Period  Weeks    Status  New      PT LONG TERM GOAL #3   Title  Patient will improve gait velocity with LRAD to >=2.62 ft/sec (indicative of safe community ambulation)    Time  8    Period  Weeks    Status  New      PT LONG TERM GOAL #4   Title  Patient will ambulate modified independent with LRAD x 500 ft over outdoor paved surfaces, including slopes, ramps, curbs.     Time  8  Period  Weeks    Status  New      PT LONG TERM GOAL #5   Title  Patient will perform floor to chair transfer with supervision and no vc's    Time  8    Period  Weeks    Status  New            Plan - 04/03/18 1559    Clinical Impression Statement  Arrived late for session. SOT completed with noted decreased use of vestibular system (more impaired than somatosensory system). Educated on function of vestibular system and  instructed in HEP. Patient's questions addressed and encouraged to continue to use walking stick for additional input.     Rehab Potential  Fair    Clinical Impairments Affecting Rehab Potential  neuropathy, h/o foramen magnum ependymoma with craniotomy    PT Frequency  Other (comment) 2x/week for 4 weeks; 1x/week for 4 weeks    PT Duration  8 weeks    PT Treatment/Interventions  ADLs/Self Care Home Management;Aquatic Therapy;Canalith Repostioning;Gait training;DME Instruction;Stair training;Functional mobility training;Therapeutic activities;Therapeutic exercise;Balance training;Neuromuscular re-education;Cognitive remediation;Patient/family education;Passive range of motion;Vestibular;Visual/perceptual remediation/compensation    PT Next Visit Plan  educate in proper use of SPC vs walking pole; ?check for BPPV (if still having symptoms when turning over in bed); check understanding of HEP from 4/22 and add to HEP (?habituation seated forward bending); work on compliant surfaces, EC, and ankle strategies    Consulted and Agree with Plan of Care  Patient       Patient will benefit from skilled therapeutic intervention in order to improve the following deficits and impairments:  Abnormal gait, Decreased balance, Decreased mobility, Decreased knowledge of use of DME, Decreased strength, Increased edema, Dizziness, Difficulty walking, Impaired sensation, Impaired vision/preception, Obesity  Visit Diagnosis: Dizziness and giddiness  Muscle weakness (generalized)  Unsteadiness on feet     Problem List Patient Active Problem List   Diagnosis Date Noted  . Falls frequently 03/20/2018  . Neck pain 02/08/2018  . CKD (chronic kidney disease) 10/13/2017  . Failed total right knee replacement ([place]) 03/05/2015  . OA (osteoarthritis) of knee 03/05/2015  . Rib pain on left side 02/27/2015  . URI, acute 09/17/2013  . Swelling of limb-Bilateral leg Left > than right 07/11/2013  . Numbness-Left foot  07/11/2013  . Chronic venous insufficiency 04/19/2013  . Left hand pain 02/09/2013  . Right shoulder pain 09/12/2012  . Edema 04/24/2012  . Grief 11/10/2011  . ABSCESS, TOOTH 02/03/2011  . OSTEOPENIA 10/28/2010  . Fever 08/06/2010  . HYPERKALEMIA 07/15/2010  . ARTHRALGIA 04/08/2010  . Fatigue 04/08/2010  . XEROSTOMIA 02/04/2010  . ECZEMA 10/29/2009  . TOBACCO USE, QUIT 10/29/2009  . Anemia 06/18/2009  . 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 Diarrhea 06/18/2009  . OPACITY, VITREOUS HUMOR 01/15/2009  . COLITIS 10/16/2008  . KNEE PAIN 04/10/2008  . HYPERCHOLESTEROLEMIA 01/10/2008  . Anxiety state 11/08/2007  . Depression 11/08/2007  . Osteoarthritis 11/08/2007  . LOW BACK PAIN 11/08/2007  . Diabetes mellitus type 2, controlled ([place]) 10/07/2007  . Essential hypertension 10/07/2007  . GERD 10/07/2007  . MICROALBUMINURIA 10/07/2007    Rexanne Mano, PT 04/04/2018, 9:38 AM  [facility] [address] [place], [place], [address] Phone: [phone]   Fax:  [phone]  Name: CHALYN AMESCUA MRN: [id number] Date of Birth: 1950/12/18

## 2018-04-03 NOTE — Patient Instructions (Signed)
Feet Partial Heel-Toe (Compliant Surface) Head Motion - Eyes Closed    Stand on compliant surface: ___pillow, folded blanket_____ with right foot partially in front of the other. Close eyes and hold for 30 seconds. Once you can do 30 seconds, then move head slowly, up and down x 10 reps, then left and right x 10 reps Do __1__ sessions per day.  Copyright  VHI. All rights reserved.

## 2018-04-07 ENCOUNTER — Encounter: Payer: Self-pay | Admitting: Rehabilitative and Restorative Service Providers"

## 2018-04-07 ENCOUNTER — Encounter

## 2018-04-07 ENCOUNTER — Ambulatory Visit: Payer: Medicare Other | Admitting: Rehabilitative and Restorative Service Providers"

## 2018-04-07 DIAGNOSIS — M6281 Muscle weakness (generalized): Secondary | ICD-10-CM | POA: Diagnosis not present

## 2018-04-07 DIAGNOSIS — H8111 Benign paroxysmal vertigo, right ear: Secondary | ICD-10-CM | POA: Diagnosis not present

## 2018-04-07 DIAGNOSIS — M25511 Pain in right shoulder: Secondary | ICD-10-CM | POA: Diagnosis not present

## 2018-04-07 DIAGNOSIS — S40011D Contusion of right shoulder, subsequent encounter: Secondary | ICD-10-CM | POA: Diagnosis not present

## 2018-04-07 DIAGNOSIS — R2681 Unsteadiness on feet: Secondary | ICD-10-CM | POA: Diagnosis not present

## 2018-04-07 DIAGNOSIS — R2689 Other abnormalities of gait and mobility: Secondary | ICD-10-CM | POA: Diagnosis not present

## 2018-04-07 DIAGNOSIS — R42 Dizziness and giddiness: Secondary | ICD-10-CM

## 2018-04-07 NOTE — Therapy (Signed)
[facility] [address] [place], [place], [address] Phone: [phone]   Fax:  [phone]  Physical Therapy Treatment  Patient Details  Name: [name] MRN: [id number] Date of Birth: [birth year] Referring Provider: Cassandria Anger, MD   Encounter Date: 04/07/2018  PT End of Session - 04/07/18 1240    Visit Number  3    Number of Visits  13 eval plus 12 visits    Date for PT Re-Evaluation  06/28/18    Authorization Type  [employer] MCR    Authorization Time Period  03/30/18 to 06/28/18    PT Start Time  1232    PT Stop Time  1314    PT Time Calculation (min)  42 min    Activity Tolerance  Patient tolerated treatment well    Behavior During Therapy  [facility] for tasks assessed/performed labile       Past Medical History:  Diagnosis Date  . Anemia   . Anxiety   . Colitis 2010   microscopic- Dr Henrene Pastor  . Depression   . Diabetes mellitus    type II  . GERD (gastroesophageal reflux disease)   . Headache   . Hypertension   . IBS (irritable bowel syndrome)   . LBP (low back pain)   . Neuropathy    feet bilat   . Osteoarthritis   . Osteopenia   . Pneumonia    hx of 2014   . Renal insufficiency 2011  . Sinusitis    currently being treated with antibiotic will complete 03/04/2015    Past Surgical History:  Procedure Laterality Date  . foramen magnum ependymoma surgery  2003   Dr Rita Ohara  . JOINT REPLACEMENT Bilateral   . NASAL SINUS SURGERY     1973   . TONSILLECTOMY    . TOTAL KNEE ARTHROPLASTY     L 2008, R 2009, R 2016- Dr Maureen Ralphs  . TOTAL KNEE REVISION Right 03/05/2015   Procedure: RIGHT TOTAL KNEE ARTHROPLASTY REVISION;  Surgeon: Gaynelle Arabian, MD;  Location: [facility];  Service: Orthopedics;  Laterality: Right;    There were no vitals filed for this visit.  Subjective Assessment - 04/07/18 1232    Subjective  The patient reports that she has had a disaster in her apartment.  She notes they finished  painting and she has had to focus on getting her apartment back together.    She notes occasional dizziness after bending over and then standing up straight.  "I don't feel particularly dizzy, just unsure of where my feet are.".  The patient did not bring her walking stick today.    Pertinent History  frequent falls, DM, neuropathy, HTN, knee OA, LBP, bil TKR, broke Lt foot, craniotomy 2003 (has been labile ever since)    Patient Stated Goals  figure out why she is falling and avoid falling; doesn't want to break anything    Currently in Pain?  Yes    Pain Score  8  She notes she took pain medicine in the lobby    Pain Location  -- Shoulder pain- MRI scheduled on Tuesday, L hand osteoarthritis, L foot ankle soreness    Pain Descriptors / Indicators  Aching    Pain Type  Chronic pain    Pain Onset  More than a month ago    Pain Frequency  Intermittent    Aggravating Factors   trying to use arms    Pain Relieving Factors  medications  Vestibular Assessment - 04/07/18 1254      Vestibular Assessment   General Observation  Patient denies true spinning sensation, however notes occasional dizziness and losing place of where she is.      Positional Testing   Sidelying Test  Sidelying Right;Sidelying Left    Horizontal Canal Testing  -- Patient did not have dizziness when rolling during ex.      Sidelying Right   Sidelying Right Duration  15 seconds    Sidelying Right Symptoms  Upbeat, right rotatory nystagmus      Sidelying Left   Sidelying Left Duration  0    Sidelying Left Symptoms  No nystagmus               [facility] Adult PT Treatment/Exercise - 04/07/18 1241      Exercises   Exercises  Knee/Hip      Knee/Hip Exercises: Stretches   ITB Stretch  2 reps    ITB Stretch Limitations  in sidelying and in supine with passive overpressure       Knee/Hip Exercises: Sidelying   Hip ABduction  Right;5 reps    Hip ABduction Limitations  left side 10 reps with  tactile cues to avoid posterior rolling    Clams  10 reps x 2 sets right and left sides.      Vestibular Treatment/Exercise - 04/07/18 1256      Vestibular Treatment/Exercise   Vestibular Treatment Provided  Canalith Repositioning    Canalith Repositioning  Epley Manuever Right;Canal Roll Right       EPLEY MANUEVER RIGHT   Number of Reps   1    Overall Response  Symptoms Worsened    Response Details   After first rep of Epley's maneuver, PT brought patient into R dix hallpike to reassess and noted R beating nystagmus indicating repositioning of otoconia from R posterior canal into R horizontal canal.  Patient rolled to  R with geotropic nystagmus x 15 seconds noted with nausea.  Held each position longer to allow nausea to settle.      Canal Roll Right   Number of Reps   1    Overall Response   Improved Symptoms    Response Details   The patient did not have obserable nystagmus with right rolling after treatment.  She did note instability with gait and PT accompanied her out to the car.  We reviewed need for her to bring walking stick for therapy for safety.              PT Short Term Goals - 04/04/18 0936      PT SHORT TERM GOAL #1   Title  Patient will be independent with HEP (Target for all STGs 04/29/18)    Time  4    Period  Weeks    Status  New      PT SHORT TERM GOAL #2   Title  Patient will improve FGA to >=14/30 to demonstrate decreasing fall risk.    Time  4    Period  Weeks    Status  New      PT SHORT TERM GOAL #3   Title  Patient will improve gait velocity to >2.4 ft/sec towards velocity indicative of safe community ambulation    Time  4    Period  Weeks    Status  New      PT SHORT TERM GOAL #4   Title  Patient will ascend/descend curb with LRAD modified independent.  Time  4    Period  Weeks    Status  New      PT SHORT TERM GOAL #5   Title  Patient will perform floor to chair transfer with min assist and max vc's for technique.     Time  4     Period  Weeks    Status  New        PT Long Term Goals - 04/04/18 7001      PT LONG TERM GOAL #1   Title  Patient will be independent with updated HEP (Target all LTGs 05/29/18)    Time  8    Period  Weeks    Status  New      PT LONG TERM GOAL #2   Title  Patient will improve FGA to >=19/30 to demonstrate lesser fall risk    Time  8    Period  Weeks    Status  New      PT LONG TERM GOAL #3   Title  Patient will improve gait velocity with LRAD to >=2.62 ft/sec (indicative of safe community ambulation)    Time  8    Period  Weeks    Status  New      PT LONG TERM GOAL #4   Title  Patient will ambulate modified independent with LRAD x 500 ft over outdoor paved surfaces, including slopes, ramps, curbs.     Time  8    Period  Weeks    Status  New      PT LONG TERM GOAL #5   Title  Patient will perform floor to chair transfer with supervision and no vc's    Time  8    Period  Weeks    Status  New            Plan - 04/07/18 1614    Clinical Impression Statement  The patient has lateral hip instability that leads to short steps, trendelenberg gait.  PT worked on hip abduction and then tested for BPPV.  Positional testing revealed R posterior canal that repositioned into R horizontal canal after Epley maneuver (potentially due to limited neck extension).  The patient responded well to canolith repositioning R side with no nystagmus after treatment, however had some gait instability.     PT Treatment/Interventions  ADLs/Self Care Home Management;Aquatic Therapy;Canalith Repostioning;Gait training;DME Instruction;Stair training;Functional mobility training;Therapeutic activities;Therapeutic exercise;Balance training;Neuromuscular re-education;Cognitive remediation;Patient/family education;Passive range of motion;Vestibular;Visual/perceptual remediation/compensation    PT Next Visit Plan  educate in proper use of SPC vs walking pole; RECHECK FOR BPPV and provide habituation as  needed + canolith repositioning; check understanding of HEP from 4/22 and add to HEP (?habituation seated forward bending); work on compliant surfaces, EC, and ankle strategies    Consulted and Agree with Plan of Care  Patient       Patient will benefit from skilled therapeutic intervention in order to improve the following deficits and impairments:  Abnormal gait, Decreased balance, Decreased mobility, Decreased knowledge of use of DME, Decreased strength, Increased edema, Dizziness, Difficulty walking, Impaired sensation, Impaired vision/preception, Obesity  Visit Diagnosis: Dizziness and giddiness  Muscle weakness (generalized)  Unsteadiness on feet  Other abnormalities of gait and mobility  BPPV (benign paroxysmal positional vertigo), right     Problem List Patient Active Problem List   Diagnosis Date Noted  . Falls frequently 03/20/2018  . Neck pain 02/08/2018  . CKD (chronic kidney disease) 10/13/2017  . Failed total right knee replacement ([place])  03/05/2015  . OA (osteoarthritis) of knee 03/05/2015  . Rib pain on left side 02/27/2015  . URI, acute 09/17/2013  . Swelling of limb-Bilateral leg Left > than right 07/11/2013  . Numbness-Left foot 07/11/2013  . Chronic venous insufficiency 04/19/2013  . Left hand pain 02/09/2013  . Right shoulder pain 09/12/2012  . Edema 04/24/2012  . Grief 11/10/2011  . ABSCESS, TOOTH 02/03/2011  . OSTEOPENIA 10/28/2010  . Fever 08/06/2010  . HYPERKALEMIA 07/15/2010  . ARTHRALGIA 04/08/2010  . Fatigue 04/08/2010  . XEROSTOMIA 02/04/2010  . ECZEMA 10/29/2009  . TOBACCO USE, QUIT 10/29/2009  . Anemia 06/18/2009  . 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 Diarrhea 06/18/2009  . OPACITY, VITREOUS HUMOR 01/15/2009  . COLITIS 10/16/2008  . KNEE PAIN 04/10/2008  . HYPERCHOLESTEROLEMIA 01/10/2008  . Anxiety state 11/08/2007  . Depression 11/08/2007  . Osteoarthritis 11/08/2007  . LOW BACK PAIN 11/08/2007  .  Diabetes mellitus type 2, controlled ([place]) 10/07/2007  . Essential hypertension 10/07/2007  . GERD 10/07/2007  . MICROALBUMINURIA 10/07/2007    WEAVER,CHRISTINA, PT 04/07/2018, 4:16 PM  [facility] [address] [place], [place], [address] Phone: [phone]   Fax:  [phone]  Name: LYNDSAY TALAMANTE MRN: [id number] Date of Birth: September 05, 1951

## 2018-04-11 ENCOUNTER — Ambulatory Visit: Payer: Medicare Other | Admitting: Physical Therapy

## 2018-04-11 ENCOUNTER — Encounter: Payer: Self-pay | Admitting: Physical Therapy

## 2018-04-11 DIAGNOSIS — R2689 Other abnormalities of gait and mobility: Secondary | ICD-10-CM | POA: Diagnosis not present

## 2018-04-11 DIAGNOSIS — R2681 Unsteadiness on feet: Secondary | ICD-10-CM | POA: Diagnosis not present

## 2018-04-11 DIAGNOSIS — R42 Dizziness and giddiness: Secondary | ICD-10-CM

## 2018-04-11 DIAGNOSIS — H8111 Benign paroxysmal vertigo, right ear: Secondary | ICD-10-CM | POA: Diagnosis not present

## 2018-04-11 DIAGNOSIS — M6281 Muscle weakness (generalized): Secondary | ICD-10-CM

## 2018-04-11 NOTE — Therapy (Signed)
[facility] [address] [place], [place], [address] Phone: [phone]   Fax:  [phone]  Physical Therapy Treatment  Patient Details  Name: [name] MRN: [id number] Date of Birth: 02/15/1951 Referring Provider: Cassandria Anger, MD   Encounter Date: 04/11/2018  PT End of Session - 04/11/18 1336    Visit Number  4    Number of Visits  13 eval plus 12 visits    Date for PT Re-Evaluation  06/28/18    Authorization Type  [employer] MCR    Authorization Time Period  03/30/18 to 06/28/18    PT Start Time  0802    PT Stop Time  0843    PT Time Calculation (min)  41 min    Activity Tolerance  Patient tolerated treatment well    Behavior During Therapy  [facility] for tasks assessed/performed labile       Past Medical History:  Diagnosis Date  . Anemia   . Anxiety   . Colitis 2010   microscopic- Dr Henrene Pastor  . Depression   . Diabetes mellitus    type II  . GERD (gastroesophageal reflux disease)   . Headache   . Hypertension   . IBS (irritable bowel syndrome)   . LBP (low back pain)   . Neuropathy    feet bilat   . Osteoarthritis   . Osteopenia   . Pneumonia    hx of 2014   . Renal insufficiency 2011  . Sinusitis    currently being treated with antibiotic will complete 03/04/2015    Past Surgical History:  Procedure Laterality Date  . foramen magnum ependymoma surgery  2003   Dr Rita Ohara  . JOINT REPLACEMENT Bilateral   . NASAL SINUS SURGERY     1973   . TONSILLECTOMY    . TOTAL KNEE ARTHROPLASTY     L 2008, R 2009, R 2016- Dr Maureen Ralphs  . TOTAL KNEE REVISION Right 03/05/2015   Procedure: RIGHT TOTAL KNEE ARTHROPLASTY REVISION;  Surgeon: Gaynelle Arabian, MD;  Location: [facility];  Service: Orthopedics;  Laterality: Right;    There were no vitals filed for this visit.  Subjective Assessment - 04/11/18 0808    Subjective  Having rt shoulder MRI on Wed evening. Felt better after several hours after last session, but  sitll having some vertigo    Pertinent History  frequent falls, DM, neuropathy, HTN, knee OA, LBP, bil TKR, broke Lt foot, craniotomy 2003 (has been labile ever since)    Patient Stated Goals  figure out why she is falling and avoid falling; doesn't want to break anything    Currently in Pain?  Yes    Pain Score  7     Pain Location  Shoulder    Pain Orientation  Right    Pain Descriptors / Indicators  Aching    Pain Type  Chronic pain    Pain Onset  More than a month ago    Pain Frequency  Intermittent    Pain Relieving Factors  forgot to take pain medicine before she left home             Vestibular Assessment - 04/11/18 1329      Vestibular Assessment   General Observation  Reports vertigo is less and balance is better, but still not gone      Symptom Behavior   Type of Dizziness  Spinning    Frequency of Dizziness  daily    Duration of Dizziness  seconds    Aggravating Factors  Activity in general;Supine to sit;Forward bending    Relieving Factors  Head stationary      Positional Testing   Dix-Hallpike  Dix-Hallpike Right;Dix-Hallpike Left    Horizontal Canal Testing  Horizontal Canal Right;Horizontal Canal Left      Dix-Hallpike Right   Dix-Hallpike Right Duration  10    Dix-Hallpike Right Symptoms  Upbeat, right rotatory nystagmus      Dix-Hallpike Left   Dix-Hallpike Left Duration  0    Dix-Hallpike Left Symptoms  No nystagmus      Horizontal Canal Right   Horizontal Canal Right Duration  0      Horizontal Canal Left   Horizontal Canal Left Duration  0               [facility] Adult PT Treatment/Exercise - 04/11/18 1331      Knee/Hip Exercises: Stretches   ITB Stretch  1 rep    ITB Stretch Limitations  in sidelying with leg in front and over EOB; x 30 sec    Piriformis Stretch  Right;1 rep;20 seconds supine rt hip/knee 90 & lower to left side; could not tolera      Knee/Hip Exercises: Sidelying   Clams  bil x 10 reps ea; vc for technique       Vestibular Treatment/Exercise - 04/11/18 1324      Vestibular Treatment/Exercise   Vestibular Treatment Provided  Canalith Repositioning    Canalith Repositioning  Epley Manuever Right;Appiani Right appiani to treat rt horizontal canal       EPLEY MANUEVER RIGHT   Number of Reps   1    Overall Response  Symptoms Resolved    Response Details   <10 seconds of nystagmus/symptoms; on repeat dix-Hallpike no symptoms      Canal Roll Right   Number of Reps   1    Overall Response   No change    Response Details   slight spinning with roll from left side to right for prolonged positioning on her right      Appiani Right   Number of Reps   1    Overall Response   No Change            PT Education - 04/11/18 1335    Education Details  see handouts and additions to HEP    Person(s) Educated  Patient    Methods  Explanation;Demonstration;Verbal cues;Handout;Tactile cues    Comprehension  Verbalized understanding;Returned demonstration;Verbal cues required;Tactile cues required;Need further instruction       PT Short Term Goals - 04/04/18 0936      PT SHORT TERM GOAL #1   Title  Patient will be independent with HEP (Target for all STGs 04/29/18)    Time  4    Period  Weeks    Status  New      PT SHORT TERM GOAL #2   Title  Patient will improve FGA to >=14/30 to demonstrate decreasing fall risk.    Time  4    Period  Weeks    Status  New      PT SHORT TERM GOAL #3   Title  Patient will improve gait velocity to >2.4 ft/sec towards velocity indicative of safe community ambulation    Time  4    Period  Weeks    Status  New      PT SHORT TERM GOAL #4   Title  Patient will ascend/descend curb with LRAD  modified independent.     Time  4    Period  Weeks    Status  New      PT SHORT TERM GOAL #5   Title  Patient will perform floor to chair transfer with min assist and max vc's for technique.     Time  4    Period  Weeks    Status  New        PT Long Term Goals -  04/04/18 5465      PT LONG TERM GOAL #1   Title  Patient will be independent with updated HEP (Target all LTGs 05/29/18)    Time  8    Period  Weeks    Status  New      PT LONG TERM GOAL #2   Title  Patient will improve FGA to >=19/30 to demonstrate lesser fall risk    Time  8    Period  Weeks    Status  New      PT LONG TERM GOAL #3   Title  Patient will improve gait velocity with LRAD to >=2.62 ft/sec (indicative of safe community ambulation)    Time  8    Period  Weeks    Status  New      PT LONG TERM GOAL #4   Title  Patient will ambulate modified independent with LRAD x 500 ft over outdoor paved surfaces, including slopes, ramps, curbs.     Time  8    Period  Weeks    Status  New      PT LONG TERM GOAL #5   Title  Patient will perform floor to chair transfer with supervision and no vc's    Time  8    Period  Weeks    Status  New            Plan - 04/11/18 1337    Clinical Impression Statement  Patient assessed for rt posterior canal and horizontal canal BPPV. +for posterior canal and completed Epley. On repeat Hallpike-Dix patient had no further symptoms. Horizontal canal/head roll test negative bil, however when rolling from left to right during exercises, pt reported feeling a few seconds of spinning. Educated in prolonged positioning from left onto right side (as much as her shoulder will tolerate rt sidelying). No vertigo elicited with Appiani maneuver. Patient provided handout for HEP and for prolonged positioning for horizontal canalithiasiss.    Rehab Potential  Fair    Clinical Impairments Affecting Rehab Potential  neuropathy, h/o foramen magnum ependymoma with craniotomy    PT Frequency  Other (comment) 2x/wk for 4 weeks; 1x/wk for 4 wks    PT Duration  8 weeks    PT Treatment/Interventions  ADLs/Self Care Home Management;Aquatic Therapy;Canalith Repostioning;Gait training;DME Instruction;Stair training;Functional mobility training;Therapeutic  activities;Therapeutic exercise;Balance training;Neuromuscular re-education;Cognitive remediation;Patient/family education;Passive range of motion;Vestibular;Visual/perceptual remediation/compensation    PT Next Visit Plan  educate in proper use of SPC vs walking pole; RECHECK FOR BPPV and provide habituation as needed + canolith repositioning;  add to HEP for balance and LE strength as approp; (?habituation seated forward bending); work on compliant surfaces, EC, and ankle strategies    Consulted and Agree with Plan of Care  Patient       Patient will benefit from skilled therapeutic intervention in order to improve the following deficits and impairments:  Abnormal gait, Decreased balance, Decreased mobility, Decreased knowledge of use of DME, Decreased strength, Increased edema, Dizziness, Difficulty walking, Impaired sensation, Impaired  vision/preception, Obesity  Visit Diagnosis: Dizziness and giddiness  Muscle weakness (generalized)  Unsteadiness on feet     Problem List Patient Active Problem List   Diagnosis Date Noted  . Falls frequently 03/20/2018  . Neck pain 02/08/2018  . CKD (chronic kidney disease) 10/13/2017  . Failed total right knee replacement ([place]) 03/05/2015  . OA (osteoarthritis) of knee 03/05/2015  . Rib pain on left side 02/27/2015  . URI, acute 09/17/2013  . Swelling of limb-Bilateral leg Left > than right 07/11/2013  . Numbness-Left foot 07/11/2013  . Chronic venous insufficiency 04/19/2013  . Left hand pain 02/09/2013  . Right shoulder pain 09/12/2012  . Edema 04/24/2012  . Grief 11/10/2011  . ABSCESS, TOOTH 02/03/2011  . OSTEOPENIA 10/28/2010  . Fever 08/06/2010  . HYPERKALEMIA 07/15/2010  . ARTHRALGIA 04/08/2010  . Fatigue 04/08/2010  . XEROSTOMIA 02/04/2010  . ECZEMA 10/29/2009  . TOBACCO USE, QUIT 10/29/2009  . Anemia 06/18/2009  . 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 Diarrhea 06/18/2009  . OPACITY, VITREOUS HUMOR  01/15/2009  . COLITIS 10/16/2008  . KNEE PAIN 04/10/2008  . HYPERCHOLESTEROLEMIA 01/10/2008  . Anxiety state 11/08/2007  . Depression 11/08/2007  . Osteoarthritis 11/08/2007  . LOW BACK PAIN 11/08/2007  . Diabetes mellitus type 2, controlled ([place]) 10/07/2007  . Essential hypertension 10/07/2007  . GERD 10/07/2007  . MICROALBUMINURIA 10/07/2007    Rexanne Mano, PT 04/11/2018, 1:45 PM  [facility] [address] [place], [place], [address] Phone: [phone]   Fax:  [phone]  Name: PIERINA SCHUKNECHT MRN: [id number] Date of Birth: 1951-06-13

## 2018-04-11 NOTE — Patient Instructions (Addendum)
  On each leg, do the clam exercise first (warm up the muscle) and then follow it with the stretch.  Abduction: Clam (Eccentric) - Side-Lying    Lie on side with knees bent. Lift top knee, keeping feet together. Keep trunk steady. Slowly lower. _10-20__ reps per set, __1_ sets per day, _5__ days per week.    IT Band: Leg Hang (Side-Lying)    Lie on side with right leg on top. Keep hip and knee straight. Move top leg in front of your and hang over edge. Hold _30__ seconds. Relax. Repeat __3_ times. Do _1_ times a day. Repeat on other side.      Forced prolonged sidelying. Start on your left side and roll over onto your right side keeping your head against the pillow. Lie on your right side 10-20 minutes (as your shoulder will allow). Repeat 1-2 times per day.

## 2018-04-12 DIAGNOSIS — M25511 Pain in right shoulder: Secondary | ICD-10-CM | POA: Diagnosis not present

## 2018-04-14 ENCOUNTER — Ambulatory Visit: Payer: Medicare Other | Attending: Internal Medicine | Admitting: Physical Therapy

## 2018-04-14 ENCOUNTER — Encounter: Payer: Self-pay | Admitting: Physical Therapy

## 2018-04-14 DIAGNOSIS — M75121 Complete rotator cuff tear or rupture of right shoulder, not specified as traumatic: Secondary | ICD-10-CM | POA: Diagnosis not present

## 2018-04-14 DIAGNOSIS — M6281 Muscle weakness (generalized): Secondary | ICD-10-CM | POA: Insufficient documentation

## 2018-04-14 DIAGNOSIS — M25511 Pain in right shoulder: Secondary | ICD-10-CM | POA: Diagnosis not present

## 2018-04-14 DIAGNOSIS — R2681 Unsteadiness on feet: Secondary | ICD-10-CM | POA: Insufficient documentation

## 2018-04-14 DIAGNOSIS — R2689 Other abnormalities of gait and mobility: Secondary | ICD-10-CM | POA: Diagnosis not present

## 2018-04-14 DIAGNOSIS — H8111 Benign paroxysmal vertigo, right ear: Secondary | ICD-10-CM | POA: Diagnosis not present

## 2018-04-14 DIAGNOSIS — M75120 Complete rotator cuff tear or rupture of unspecified shoulder, not specified as traumatic: Secondary | ICD-10-CM | POA: Insufficient documentation

## 2018-04-14 DIAGNOSIS — R42 Dizziness and giddiness: Secondary | ICD-10-CM

## 2018-04-14 NOTE — Therapy (Signed)
[facility] [address] [place], [place], [address] Phone: [phone]   Fax:  [phone]  Physical Therapy Treatment  Patient Details  Name: [name] MRN: [id number] Date of Birth: [birth year] Referring Provider: Cassandria Anger, MD   Encounter Date: 04/14/2018  PT End of Session - 04/14/18 1919    Visit Number  5    Number of Visits  13 eval plus 12 visits    Date for PT Re-Evaluation  06/28/18    Authorization Type  [employer] MCR    Authorization Time Period  03/30/18 to 06/28/18    PT Start Time  0850    PT Stop Time  0930    PT Time Calculation (min)  40 min    Activity Tolerance  Patient tolerated treatment well    Behavior During Therapy  [facility] for tasks assessed/performed labile       Past Medical History:  Diagnosis Date  . Anemia   . Anxiety   . Colitis 2010   microscopic- Dr Henrene Pastor  . Depression   . Diabetes mellitus    type II  . GERD (gastroesophageal reflux disease)   . Headache   . Hypertension   . IBS (irritable bowel syndrome)   . LBP (low back pain)   . Neuropathy    feet bilat   . Osteoarthritis   . Osteopenia   . Pneumonia    hx of 2014   . Renal insufficiency 2011  . Sinusitis    currently being treated with antibiotic will complete 03/04/2015    Past Surgical History:  Procedure Laterality Date  . foramen magnum ependymoma surgery  2003   Dr Rita Ohara  . JOINT REPLACEMENT Bilateral   . NASAL SINUS SURGERY     1973   . TONSILLECTOMY    . TOTAL KNEE ARTHROPLASTY     L 2008, R 2009, R 2016- Dr Maureen Ralphs  . TOTAL KNEE REVISION Right 03/05/2015   Procedure: RIGHT TOTAL KNEE ARTHROPLASTY REVISION;  Surgeon: Gaynelle Arabian, MD;  Location: [facility];  Service: Orthopedics;  Laterality: Right;    There were no vitals filed for this visit.  Subjective Assessment - 04/14/18 0852    Subjective  Doesn't have results of MRI of rt shoulder yet. Getting onto MRI table and lying down to her  left she got the spins. Also tweaked her back and it's still hurting.     Pertinent History  frequent falls, DM, neuropathy, HTN, knee OA, LBP, bil TKR, broke Lt foot, craniotomy 2003 (has been labile ever since)    Patient Stated Goals  figure out why she is falling and avoid falling; doesn't want to break anything    Currently in Pain?  Yes    Pain Location  Back    Pain Orientation  Lower;Right    Pain Descriptors / Indicators  Aching    Pain Type  Chronic pain    Pain Onset  More than a month ago    Pain Frequency  Intermittent             Vestibular Assessment - 04/14/18 1441      Positional Testing   Sidelying Test  Sidelying Right;Sidelying Left      Sidelying Right   Sidelying Right Duration  15    Sidelying Right Symptoms  Downbeat, right rotatory nystagmus      Sidelying Left   Sidelying Left Duration  0    Sidelying Left Symptoms  No nystagmus  [facility] Adult PT Treatment/Exercise - 04/14/18 1555      Ambulation/Gait   Ambulation/Gait Assistance  6: Modified independent (Device/Increase time)    Ambulation Distance (Feet)  80 Feet 60 x 3    Assistive device  Other (Comment) single walking pole (held similar to a cane)    Gait Pattern  Step-through pattern;Decreased arm swing - right;Decreased arm swing - left;Decreased step length - right;Decreased step length - left;Right foot flat;Left foot flat;Lateral trunk lean to right;Lateral trunk lean to left;Decreased trunk rotation;Wide base of support    Ambulation Surface  Indoor      Vestibular Treatment/Exercise - 04/14/18 1442      Vestibular Treatment/Exercise   Vestibular Treatment Provided  Canalith Repositioning    Canalith Repositioning  Semont Procedure Right Anterior;Semont Procedure Right Posterior modified Semont right anterior     Habituation Exercises  Comment seated nose to knee (5 reps each knee)      Semont Procedure Right Anterior   Number of Reps   1    Overall Response    No change    Response Details   modified Semont; no symptoms elicited      Semont Procedure Right Posterior   Number of Reps   1    Overall Response   Improved Symptoms    Response Details   +rt rotary,downbeating nystagmus on first position and very slight spin with transition to 2nd position         Balance Exercises - 04/14/18 1556      Balance Exercises: Standing   Rockerboard  Anterior/posterior;Head turns;EO;Intermittent UE support;EC wt-shifting ant-post with 3 second hold at extremes; hip str    Balance Beam  red/gray feet apart; single UE support for step off/on forward and backward; vc for larger steps to not catch her foot when stepping off        PT Education - 04/14/18 1608    Education Details  how anterior/posterior canals are positioned and work in tandem; ?rt posterior canal vs lt anterior canal    Person(s) Educated  Patient    Methods  Explanation    Comprehension  Verbalized understanding       PT Short Term Goals - 04/04/18 0936      PT SHORT TERM GOAL #1   Title  Patient will be independent with HEP (Target for all STGs 04/29/18)    Time  4    Period  Weeks    Status  New      PT SHORT TERM GOAL #2   Title  Patient will improve FGA to >=14/30 to demonstrate decreasing fall risk.    Time  4    Period  Weeks    Status  New      PT SHORT TERM GOAL #3   Title  Patient will improve gait velocity to >2.4 ft/sec towards velocity indicative of safe community ambulation    Time  4    Period  Weeks    Status  New      PT SHORT TERM GOAL #4   Title  Patient will ascend/descend curb with LRAD modified independent.     Time  4    Period  Weeks    Status  New      PT SHORT TERM GOAL #5   Title  Patient will perform floor to chair transfer with min assist and max vc's for technique.     Time  4    Period  Weeks    Status  New        PT Long Term Goals - 04/04/18 4967      PT LONG TERM GOAL #1   Title  Patient will be independent with updated  HEP (Target all LTGs 05/29/18)    Time  8    Period  Weeks    Status  New      PT LONG TERM GOAL #2   Title  Patient will improve FGA to >=19/30 to demonstrate lesser fall risk    Time  8    Period  Weeks    Status  New      PT LONG TERM GOAL #3   Title  Patient will improve gait velocity with LRAD to >=2.62 ft/sec (indicative of safe community ambulation)    Time  8    Period  Weeks    Status  New      PT LONG TERM GOAL #4   Title  Patient will ambulate modified independent with LRAD x 500 ft over outdoor paved surfaces, including slopes, ramps, curbs.     Time  8    Period  Weeks    Status  New      PT LONG TERM GOAL #5   Title  Patient will perform floor to chair transfer with supervision and no vc's    Time  8    Period  Weeks    Status  New            Plan - 04/14/18 1924    Clinical Impression Statement  Patient reports overall improvement in vertigo, however when lying down on the MRI table onto her left side she experienced "bad case of the spins." Testing negative except rt sidelying test with rt rotatory downbeat nystagmus lasting 20 seconds. This nystagmus is potentially a variant BPPV. Treated with Semont for right posterior canal with no vertigo or nystagmus elicited. Treated with modified Semont maneuver for rt anterior canal with +vertigo with nystagmus on first step,but not second step. Further testing and treatment deferred as pt may have a variant form of BPPV with plan to discuss with colleagues. Increased time for maneuvers due to rt shoulder and low back pain. Patient insisted she wanted to proceed with BPPV interventions despite her pain. Remainder of session worked on safe, proper use of walking stick and balance training with compliant surfaces, EO, EC, and eliciting ankle and hip strategies to recover her balance.     Rehab Potential  Fair    Clinical Impairments Affecting Rehab Potential  neuropathy, h/o foramen magnum ependymoma with craniotomy    PT  Frequency  Other (comment) 2x/wk for 4 weeks; 1x/wk for 4 wks    PT Duration  8 weeks    PT Treatment/Interventions  ADLs/Self Care Home Management;Aquatic Therapy;Canalith Repostioning;Gait training;DME Instruction;Stair training;Functional mobility training;Therapeutic activities;Therapeutic exercise;Balance training;Neuromuscular re-education;Cognitive remediation;Patient/family education;Passive range of motion;Vestibular;Visual/perceptual remediation/compensation    PT Next Visit Plan  check how tolerated: 1) seated habituation forward bend to each knee; 2) forced prolonged positioning rolling from left to her right side; RECHECK FOR BPPV (check bil horizontal canals, then left Hallpike-Dix, then rt Hallpike-Dix, and finally left then right sidelying tests to determine if further repositioning maneuvers indicated; add to HEP for balance and LE strength as approp (especially hip abduction); work on compliant surfaces, EC, and hip, ankle strategies    PT Home Exercise Plan  corner ex: EC,compliant, partial heel toe, x 30 sec and progress to head turns horiz & vertical; forced prolonged positioning  for possible horiz BPPV (roll from left to right); habituation forwward bend to each knee x 5 reps; sidelying clam; sidelying IT band stretch    Consulted and Agree with Plan of Care  Patient       Patient will benefit from skilled therapeutic intervention in order to improve the following deficits and impairments:  Abnormal gait, Decreased balance, Decreased mobility, Decreased knowledge of use of DME, Decreased strength, Increased edema, Dizziness, Difficulty walking, Impaired sensation, Impaired vision/preception, Obesity  Visit Diagnosis: Dizziness and giddiness  Muscle weakness (generalized)  Unsteadiness on feet  BPPV (benign paroxysmal positional vertigo), right     Problem List Patient Active Problem List   Diagnosis Date Noted  . Falls frequently 03/20/2018  . Neck pain 02/08/2018  .  CKD (chronic kidney disease) 10/13/2017  . Failed total right knee replacement ([place]) 03/05/2015  . OA (osteoarthritis) of knee 03/05/2015  . Rib pain on left side 02/27/2015  . URI, acute 09/17/2013  . Swelling of limb-Bilateral leg Left > than right 07/11/2013  . Numbness-Left foot 07/11/2013  . Chronic venous insufficiency 04/19/2013  . Left hand pain 02/09/2013  . Right shoulder pain 09/12/2012  . Edema 04/24/2012  . Grief 11/10/2011  . ABSCESS, TOOTH 02/03/2011  . OSTEOPENIA 10/28/2010  . Fever 08/06/2010  . HYPERKALEMIA 07/15/2010  . ARTHRALGIA 04/08/2010  . Fatigue 04/08/2010  . XEROSTOMIA 02/04/2010  . ECZEMA 10/29/2009  . TOBACCO USE, QUIT 10/29/2009  . Anemia 06/18/2009  . 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 Diarrhea 06/18/2009  . OPACITY, VITREOUS HUMOR 01/15/2009  . COLITIS 10/16/2008  . KNEE PAIN 04/10/2008  . HYPERCHOLESTEROLEMIA 01/10/2008  . Anxiety state 11/08/2007  . Depression 11/08/2007  . Osteoarthritis 11/08/2007  . LOW BACK PAIN 11/08/2007  . Diabetes mellitus type 2, controlled ([place]) 10/07/2007  . Essential hypertension 10/07/2007  . GERD 10/07/2007  . MICROALBUMINURIA 10/07/2007    Rexanne Mano, PT 04/14/2018, 8:51 PM  [facility] [address] [place], [place], [address] Phone: [phone]   Fax:  [phone]  Name: [name] MRN: [id number] Date of Birth: Nov 14, 1951

## 2018-04-14 NOTE — Patient Instructions (Signed)
Access Code: [email or web address]  URL: [email or web address]  Date: 04/14/2018  Prepared by: Barry Brunner   Exercises  Seated Nose to Left Knee Vestibular Habituation - 10 reps - 1 sets - until dizziness resolves hold - 2x daily - 7x weekly (do 5 reps to left knee and 5 reps to right knee)

## 2018-04-18 ENCOUNTER — Ambulatory Visit: Payer: Medicare Other | Admitting: Physical Therapy

## 2018-04-18 ENCOUNTER — Encounter: Payer: Self-pay | Admitting: Physical Therapy

## 2018-04-18 DIAGNOSIS — H8111 Benign paroxysmal vertigo, right ear: Secondary | ICD-10-CM

## 2018-04-18 DIAGNOSIS — M6281 Muscle weakness (generalized): Secondary | ICD-10-CM | POA: Diagnosis not present

## 2018-04-18 DIAGNOSIS — R42 Dizziness and giddiness: Secondary | ICD-10-CM | POA: Diagnosis not present

## 2018-04-18 DIAGNOSIS — R2681 Unsteadiness on feet: Secondary | ICD-10-CM

## 2018-04-18 DIAGNOSIS — R2689 Other abnormalities of gait and mobility: Secondary | ICD-10-CM | POA: Diagnosis not present

## 2018-04-18 NOTE — Therapy (Signed)
[facility] [address] [place], [place], [address] Phone: [phone]   Fax:  [phone]  Physical Therapy Treatment  Patient Details  Name: [name] MRN: [id number] Date of Birth: Oct 20, 1951 Referring Provider: Cassandria Anger, MD   Encounter Date: 04/18/2018  PT End of Session - 04/18/18 1542    Visit Number  6    Number of Visits  13 eval plus 12 visits    Date for PT Re-Evaluation  06/28/18    Authorization Type  [employer] MCR    Authorization Time Period  03/30/18 to 06/28/18    PT Start Time  1445    PT Stop Time  1530    PT Time Calculation (min)  45 min    Activity Tolerance  Patient tolerated treatment well    Behavior During Therapy  [facility] for tasks assessed/performed labile       Past Medical History:  Diagnosis Date  . Anemia   . Anxiety   . Colitis 2010   microscopic- Dr Henrene Pastor  . Depression   . Diabetes mellitus    type II  . GERD (gastroesophageal reflux disease)   . Headache   . Hypertension   . IBS (irritable bowel syndrome)   . LBP (low back pain)   . Neuropathy    feet bilat   . Osteoarthritis   . Osteopenia   . Pneumonia    hx of 2014   . Renal insufficiency 2011  . Sinusitis    currently being treated with antibiotic will complete 03/04/2015    Past Surgical History:  Procedure Laterality Date  . foramen magnum ependymoma surgery  2003   Dr Rita Ohara  . JOINT REPLACEMENT Bilateral   . NASAL SINUS SURGERY     1973   . TONSILLECTOMY    . TOTAL KNEE ARTHROPLASTY     L 2008, R 2009, R 2016- Dr Maureen Ralphs  . TOTAL KNEE REVISION Right 03/05/2015   Procedure: RIGHT TOTAL KNEE ARTHROPLASTY REVISION;  Surgeon: Gaynelle Arabian, MD;  Location: [facility];  Service: Orthopedics;  Laterality: Right;    There were no vitals filed for this visit.  Subjective Assessment - 04/18/18 1452    Subjective  Tripped and fell on her left side this morning. Tripped over the edge of a rug and vacuum  cleaner cord. Left ribs and back are sore. Did not hit head. Reports she has a full rotator cuff tear and being referred to Dr. Onnie Graham for shoulder surgery (? shoulder replacement)    Pertinent History  frequent falls, DM, neuropathy, HTN, knee OA, LBP, bil TKR, broke Lt foot, craniotomy 2003 (has been labile ever since)    Patient Stated Goals  figure out why she is falling and avoid falling; doesn't want to break anything    Currently in Pain?  Yes    Pain Score  5     Pain Location  Head    Pain Descriptors / Indicators  Headache    Pain Type  Chronic pain    Pain Onset  More than a month ago    Pain Frequency  Intermittent             Vestibular Assessment - 04/18/18 1536      Dix-Hallpike Right   Dix-Hallpike Right Duration  7    Dix-Hallpike Right Symptoms  Upbeat, right rotatory nystagmus Repeated after Epley with no symptoms  Vestibular Treatment/Exercise - 04/18/18 1536      Vestibular Treatment/Exercise   Vestibular Treatment Provided  Canalith Repositioning;Habituation    Canalith Repositioning  Epley Manuever Right       EPLEY MANUEVER RIGHT   Number of Reps   1    Overall Response  Symptoms Resolved            PT Education - 04/18/18 1540    Education Details  remove throw rugs (she plans to give the one she tripped over to family member; has one large rug that she may tape the edges down); educated on update to [employer]) Educated  Patient       PT Short Term Goals - 04/04/18 0936      PT SHORT TERM GOAL #1   Title  Patient will be independent with HEP (Target for all STGs 04/29/18)    Time  4    Period  Weeks    Status  New      PT SHORT TERM GOAL #2   Title  Patient will improve FGA to >=14/30 to demonstrate decreasing fall risk.    Time  4    Period  Weeks    Status  New      PT SHORT TERM GOAL #3   Title  Patient will improve gait velocity to >2.4 ft/sec towards velocity indicative of safe community  ambulation    Time  4    Period  Weeks    Status  New      PT SHORT TERM GOAL #4   Title  Patient will ascend/descend curb with LRAD modified independent.     Time  4    Period  Weeks    Status  New      PT SHORT TERM GOAL #5   Title  Patient will perform floor to chair transfer with min assist and max vc's for technique.     Time  4    Period  Weeks    Status  New        PT Long Term Goals - 04/04/18 1610      PT LONG TERM GOAL #1   Title  Patient will be independent with updated HEP (Target all LTGs 05/29/18)    Time  8    Period  Weeks    Status  New      PT LONG TERM GOAL #2   Title  Patient will improve FGA to >=19/30 to demonstrate lesser fall risk    Time  8    Period  Weeks    Status  New      PT LONG TERM GOAL #3   Title  Patient will improve gait velocity with LRAD to >=2.62 ft/sec (indicative of safe community ambulation)    Time  8    Period  Weeks    Status  New      PT LONG TERM GOAL #4   Title  Patient will ambulate modified independent with LRAD x 500 ft over outdoor paved surfaces, including slopes, ramps, curbs.     Time  8    Period  Weeks    Status  New      PT LONG TERM GOAL #5   Title  Patient will perform floor to chair transfer with supervision and no vc's    Time  8    Period  Weeks    Status  New  Plan - 04/18/18 1545    Clinical Impression Statement  Patient reports she has had mild "spins" but much less. Thinks the problem is still her right ear. +Hallpike-Dix for rt posterior canal BPPV and rt Epley completed with symptoms resolved on repeat Hallpike-Dix. Educated patient in fall risk related to area rugs and convinced her to give one rug away, however she doesn't think her larger (5T73) rug is an issue. Educated on at least taping edges of rug. Updated HEP based on current movements that provoke symptoms (for habituation). Patient will continue to benefit from PT to work towards goals. (Goal check due 5/18)    Rehab  Potential  Fair    Clinical Impairments Affecting Rehab Potential  neuropathy, h/o foramen magnum ependymoma with craniotomy    PT Frequency  Other (comment) 2x/wk for 4 weeks; 1x/wk for 4 wks    PT Duration  8 weeks    PT Treatment/Interventions  ADLs/Self Care Home Management;Aquatic Therapy;Canalith Repostioning;Gait training;DME Instruction;Stair training;Functional mobility training;Therapeutic activities;Therapeutic exercise;Balance training;Neuromuscular re-education;Cognitive remediation;Patient/family education;Passive range of motion;Vestibular;Visual/perceptual remediation/compensation    PT Next Visit Plan  only RECHECK FOR BPPV if symptomatic (rt post canal); ?pt ready to work on chair to floor to chair transfers; work on compliant surfaces, EC, and hip, ankle strategies; add to HEP for balance and LE strength as approp (especially hip abduction);    PT Home Exercise Plan  corner ex: EC,compliant, partial heel toe, x 30 sec and progress to head turns horiz & vertical; habituation forwward bend to each knee x 5 reps; sidelying clam; sidelying IT band stretch    Consulted and Agree with Plan of Care  Patient       Patient will benefit from skilled therapeutic intervention in order to improve the following deficits and impairments:  Abnormal gait, Decreased balance, Decreased mobility, Decreased knowledge of use of DME, Decreased strength, Increased edema, Dizziness, Difficulty walking, Impaired sensation, Impaired vision/preception, Obesity  Visit Diagnosis: Dizziness and giddiness  Unsteadiness on feet  BPPV (benign paroxysmal positional vertigo), right     Problem List Patient Active Problem List   Diagnosis Date Noted  . Falls frequently 03/20/2018  . Neck pain 02/08/2018  . CKD (chronic kidney disease) 10/13/2017  . 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 OA (osteoarthritis) of knee 03/05/2015  . Rib pain on left side 02/27/2015  . URI, acute 09/17/2013   . Swelling of limb-Bilateral leg Left > than right 07/11/2013  . Numbness-Left foot 07/11/2013  . Chronic venous insufficiency 04/19/2013  . Left hand pain 02/09/2013  . Right shoulder pain 09/12/2012  . Edema 04/24/2012  . Grief 11/10/2011  . ABSCESS, TOOTH 02/03/2011  . OSTEOPENIA 10/28/2010  . Fever 08/06/2010  . HYPERKALEMIA 07/15/2010  . ARTHRALGIA 04/08/2010  . Fatigue 04/08/2010  . XEROSTOMIA 02/04/2010  . ECZEMA 10/29/2009  . TOBACCO USE, QUIT 10/29/2009  . Anemia 06/18/2009  . 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 Diarrhea 06/18/2009  . OPACITY, VITREOUS HUMOR 01/15/2009  . COLITIS 10/16/2008  . KNEE PAIN 04/10/2008  . HYPERCHOLESTEROLEMIA 01/10/2008  . Anxiety state 11/08/2007  . Depression 11/08/2007  . Osteoarthritis 11/08/2007  . LOW BACK PAIN 11/08/2007  . Diabetes mellitus type 2, controlled ([facility]) 10/07/2007  . Essential hypertension 10/07/2007  . GERD 10/07/2007  . MICROALBUMINURIA 10/07/2007    Rexanne Mano, PT 04/18/2018, 3:53 PM  [facility] [address] [place], [place], [address] Phone: [phone]   Fax:  [phone]  Name: LAURALIE BLACKSHER MRN: [id number] Date of Birth: 1951/01/27

## 2018-04-18 NOTE — Patient Instructions (Addendum)
  Instructed to stop doing her forced prolonged positioning (rolling left to right side)    Access Code: [email or web address]  URL: [email or web address]  Date: 04/18/2018  Prepared by: Barry Brunner   Exercises  Seated Nose to Left Knee Vestibular Habituation - 5 reps - 1 sets - until dizziness stops hold - 2x daily - 7x weekly

## 2018-04-19 DIAGNOSIS — H5211 Myopia, right eye: Secondary | ICD-10-CM | POA: Diagnosis not present

## 2018-04-19 DIAGNOSIS — H524 Presbyopia: Secondary | ICD-10-CM | POA: Diagnosis not present

## 2018-04-19 DIAGNOSIS — E119 Type 2 diabetes mellitus without complications: Secondary | ICD-10-CM | POA: Diagnosis not present

## 2018-04-19 DIAGNOSIS — H52223 Regular astigmatism, bilateral: Secondary | ICD-10-CM | POA: Diagnosis not present

## 2018-04-21 ENCOUNTER — Encounter: Payer: Self-pay | Admitting: Rehabilitative and Restorative Service Providers"

## 2018-04-21 ENCOUNTER — Ambulatory Visit: Payer: Medicare Other | Admitting: Rehabilitative and Restorative Service Providers"

## 2018-04-21 DIAGNOSIS — R2681 Unsteadiness on feet: Secondary | ICD-10-CM | POA: Diagnosis not present

## 2018-04-21 DIAGNOSIS — R42 Dizziness and giddiness: Secondary | ICD-10-CM | POA: Diagnosis not present

## 2018-04-21 DIAGNOSIS — R2689 Other abnormalities of gait and mobility: Secondary | ICD-10-CM

## 2018-04-21 DIAGNOSIS — H8111 Benign paroxysmal vertigo, right ear: Secondary | ICD-10-CM | POA: Diagnosis not present

## 2018-04-21 DIAGNOSIS — M6281 Muscle weakness (generalized): Secondary | ICD-10-CM

## 2018-04-21 NOTE — Therapy (Signed)
[facility] [address] [place], [place], [address] Phone: [phone]   Fax:  [phone]  Physical Therapy Treatment  Patient Details  Name: [name] MRN: [id number] Date of Birth: 20-Aug-1951 Referring Provider: Cassandria Anger, MD   Encounter Date: 04/21/2018  PT End of Session - 04/21/18 1433    Visit Number  7    Number of Visits  13 eval plus 12 visits    Date for PT Re-Evaluation  06/28/18    Authorization Type  [employer] MCR    Authorization Time Period  03/30/18 to 06/28/18    PT Start Time  1415    PT Stop Time  1455    PT Time Calculation (min)  40 min    Activity Tolerance  Patient tolerated treatment well    Behavior During Therapy  [facility] for tasks assessed/performed labile       Past Medical History:  Diagnosis Date  . Anemia   . Anxiety   . Colitis 2010   microscopic- Dr Henrene Pastor  . Depression   . Diabetes mellitus    type II  . GERD (gastroesophageal reflux disease)   . Headache   . Hypertension   . IBS (irritable bowel syndrome)   . LBP (low back pain)   . Neuropathy    feet bilat   . Osteoarthritis   . Osteopenia   . Pneumonia    hx of 2014   . Renal insufficiency 2011  . Sinusitis    currently being treated with antibiotic will complete 03/04/2015    Past Surgical History:  Procedure Laterality Date  . foramen magnum ependymoma surgery  2003   Dr Rita Ohara  . JOINT REPLACEMENT Bilateral   . NASAL SINUS SURGERY     1973   . TONSILLECTOMY    . TOTAL KNEE ARTHROPLASTY     L 2008, R 2009, R 2016- Dr Maureen Ralphs  . TOTAL KNEE REVISION Right 03/05/2015   Procedure: RIGHT TOTAL KNEE ARTHROPLASTY REVISION;  Surgeon: Gaynelle Arabian, MD;  Location: [facility];  Service: Orthopedics;  Laterality: Right;    There were no vitals filed for this visit.  Subjective Assessment - 04/21/18 1415    Subjective  The patient reports frustration related to her home rennovations.  She notes her ribs are  still sore from the fall she had 3 days ago.    Dizziness is not gone, but it's much, much better.  Still notes it when moving from bending forward to return to standing.    Pertinent History  frequent falls, DM, neuropathy, HTN, knee OA, LBP, bil TKR, broke Lt foot, craniotomy 2003 (has been labile ever since)    Patient Stated Goals  figure out why she is falling and avoid falling; doesn't want to break anything    Currently in Pain?  Yes    Pain Score  5  and L ribs    Pain Location  Shoulder    Pain Orientation  Right    Pain Descriptors / Indicators  Aching    Pain Type  Chronic pain    Pain Onset  More than a month ago    Pain Frequency  Intermittent    Aggravating Factors   recent fall                       [facility] Adult PT Treatment/Exercise - 04/21/18 1510      Ambulation/Gait   Ambulation/Gait  Yes  Ambulation/Gait Assistance  6: Modified independent (Device/Increase time)    Ambulation Distance (Feet)  100 Feet    Assistive device  Other (Comment) walking stick    Gait Pattern  Step-through pattern;Decreased arm swing - right;Decreased arm swing - left;Decreased step length - right;Decreased step length - left;Right foot flat;Left foot flat;Lateral trunk lean to right;Lateral trunk lean to left;Decreased trunk rotation;Wide base of support    Ambulation Surface  Indoor      Self-Care   Self-Care  Other Self-Care Comments    Other Self-Care Comments   Discussed hom safety and environmental fall hazards.  Discussed community exercise for stress management, strengthening, flexibility, and endurance.  Provided handout *see patient education.      Therapeutic Activites    Therapeutic Activities  Other Therapeutic Activities    Other Therapeutic Activities  Floor<>stand transfers coming from sit>1/2 kneel>quadriped>side sitting.  Discussed scooting along bottom to solid surface to assist with floor transfer and practiced x 5 feet in the clinic.  Performed long  sit>roll into quadriped> pull up on surface> 1/2 knee and CGA to min A to come up to standing.  Performed 2 reps (once with mat and once with chair without arm rests).      Neuro Re-ed    Neuro Re-ed Details   Side step to midline x 5 reps R<>L sides.  Performed standing with tactile cues for hip initiation to decrease trunk lateral lean, mini marching working on core stabilization with intermittent UE support.             PT Education - 04/21/18 1617    Education provided  Yes    Education Details  PT and patient discussed barriers to chair exercise class.  She is hesitant due to weight.  PT recommends she try [employer] class of return to gym (she has membership).  We discussed aquatics or seated stepper for gym activities.  Also discussed home safety due to falls.      Person(s) Educated  Patient    Methods  Explanation;Handout    Comprehension  Verbalized understanding       PT Short Term Goals - 04/21/18 1508      PT SHORT TERM GOAL #1   Title  Patient will be independent with HEP (Target for all STGs 04/29/18)    Time  4    Period  Weeks    Status  On-going    Target Date  04/29/18      PT SHORT TERM GOAL #2   Title  Patient will improve FGA to >=14/30 to demonstrate decreasing fall risk.    Time  4    Period  Weeks    Status  On-going      PT SHORT TERM GOAL #3   Title  Patient will improve gait velocity to >2.4 ft/sec towards velocity indicative of safe community ambulation    Time  4    Period  Weeks    Status  On-going      PT SHORT TERM GOAL #4   Title  Patient will ascend/descend curb with LRAD modified independent.     Time  4    Period  Weeks    Status  On-going      PT SHORT TERM GOAL #5   Title  Patient will perform floor to chair transfer with min assist and max vc's for technique.     Baseline  Patient able to perform with demonstration, max verbal cues and CGA to min A from  floor<>mat and floor<>chair.    Time  4    Period  Weeks    Status  Achieved         PT Long Term Goals - 04/04/18 6503      PT LONG TERM GOAL #1   Title  Patient will be independent with updated HEP (Target all LTGs 05/29/18)    Time  8    Period  Weeks    Status  New      PT LONG TERM GOAL #2   Title  Patient will improve FGA to >=19/30 to demonstrate lesser fall risk    Time  8    Period  Weeks    Status  New      PT LONG TERM GOAL #3   Title  Patient will improve gait velocity with LRAD to >=2.62 ft/sec (indicative of safe community ambulation)    Time  8    Period  Weeks    Status  New      PT LONG TERM GOAL #4   Title  Patient will ambulate modified independent with LRAD x 500 ft over outdoor paved surfaces, including slopes, ramps, curbs.     Time  8    Period  Weeks    Status  New      PT LONG TERM GOAL #5   Title  Patient will perform floor to chair transfer with supervision and no vc's    Time  8    Period  Weeks    Status  New            Plan - 04/21/18 1642    Clinical Impression Statement  The patient met STG for floor<>stand transfers.   She continues with lateral lean and short steps due to LE weakness.  She notes significant improvement in dizziness and c/o symptoms worse when moving from bending position to upright.  PT to continue to work to [employer].      PT Treatment/Interventions  ADLs/Self Care Home Management;Aquatic Therapy;Canalith Repostioning;Gait training;DME Instruction;Stair training;Functional mobility training;Therapeutic activities;Therapeutic exercise;Balance training;Neuromuscular re-education;Cognitive remediation;Patient/family education;Passive range of motion;Vestibular;Visual/perceptual remediation/compensation    PT Next Visit Plan  only RECHECK FOR BPPV if symptomatic (rt post canal); work on compliant surfaces, EC, and hip, ankle strategies; add to HEP for balance and LE strength as approp (especially hip abduction);  encourage community exercise as patient willing to try    Consulted and Agree with Plan of  Care  Patient       Patient will benefit from skilled therapeutic intervention in order to improve the following deficits and impairments:  Abnormal gait, Decreased balance, Decreased mobility, Decreased knowledge of use of DME, Decreased strength, Increased edema, Dizziness, Difficulty walking, Impaired sensation, Impaired vision/preception, Obesity  Visit Diagnosis: Dizziness and giddiness  Unsteadiness on feet  Muscle weakness (generalized)  Other abnormalities of gait and mobility     Problem List Patient Active Problem List   Diagnosis Date Noted  . Falls frequently 03/20/2018  . Neck pain 02/08/2018  . CKD (chronic kidney disease) 10/13/2017  . Failed total right knee replacement ([facility]) 03/05/2015  . OA (osteoarthritis) of knee 03/05/2015  . Rib pain on left side 02/27/2015  . URI, acute 09/17/2013  . Swelling of limb-Bilateral leg Left > than right 07/11/2013  . Numbness-Left foot 07/11/2013  . Chronic venous insufficiency 04/19/2013  . Left hand pain 02/09/2013  . Right shoulder pain 09/12/2012  . Edema 04/24/2012  . Grief 11/10/2011  . ABSCESS, TOOTH 02/03/2011  .  OSTEOPENIA 10/28/2010  . Fever 08/06/2010  . HYPERKALEMIA 07/15/2010  . ARTHRALGIA 04/08/2010  . Fatigue 04/08/2010  . XEROSTOMIA 02/04/2010  . ECZEMA 10/29/2009  . TOBACCO USE, QUIT 10/29/2009  . Anemia 06/18/2009  . 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 Diarrhea 06/18/2009  . OPACITY, VITREOUS HUMOR 01/15/2009  . COLITIS 10/16/2008  . KNEE PAIN 04/10/2008  . HYPERCHOLESTEROLEMIA 01/10/2008  . Anxiety state 11/08/2007  . Depression 11/08/2007  . Osteoarthritis 11/08/2007  . LOW BACK PAIN 11/08/2007  . Diabetes mellitus type 2, controlled ([facility]) 10/07/2007  . Essential hypertension 10/07/2007  . GERD 10/07/2007  . MICROALBUMINURIA 10/07/2007    WEAVER,CHRISTINA, PT 04/21/2018, 4:45 PM  [facility] [address] [place], [place], [address] Phone: [phone]   Fax:  [phone]  Name: DONNELLA MORFORD MRN: [id number] Date of Birth: Apr 30, 1951

## 2018-04-24 ENCOUNTER — Ambulatory Visit (INDEPENDENT_AMBULATORY_CARE_PROVIDER_SITE_OTHER): Payer: Medicare Other | Admitting: Internal Medicine

## 2018-04-24 ENCOUNTER — Telehealth: Payer: Self-pay | Admitting: Internal Medicine

## 2018-04-24 ENCOUNTER — Encounter: Payer: Self-pay | Admitting: Internal Medicine

## 2018-04-24 ENCOUNTER — Ambulatory Visit (INDEPENDENT_AMBULATORY_CARE_PROVIDER_SITE_OTHER)
Admission: RE | Admit: 2018-04-24 | Discharge: 2018-04-24 | Disposition: A | Discharge status: Home / Self Care | Payer: Medicare Other | Source: Ambulatory Visit | Attending: Internal Medicine | Admitting: Internal Medicine

## 2018-04-24 DIAGNOSIS — S20212A Contusion of left front wall of thorax, initial encounter: Secondary | ICD-10-CM | POA: Diagnosis not present

## 2018-04-24 DIAGNOSIS — M545 Low back pain, unspecified: Secondary | ICD-10-CM

## 2018-04-24 DIAGNOSIS — E119 Type 2 diabetes mellitus without complications: Secondary | ICD-10-CM

## 2018-04-24 DIAGNOSIS — M7989 Other specified soft tissue disorders: Secondary | ICD-10-CM | POA: Diagnosis not present

## 2018-04-24 DIAGNOSIS — S2242XA Multiple fractures of ribs, left side, initial encounter for closed fracture: Secondary | ICD-10-CM | POA: Diagnosis not present

## 2018-04-24 DIAGNOSIS — G8929 Other chronic pain: Secondary | ICD-10-CM

## 2018-04-24 MED ORDER — OXYCODONE ER 18 MG PO C12A: 1.0000 | EXTENDED_RELEASE_CAPSULE | Freq: Two times a day (BID) | ORAL | 0 refills | Status: DC

## 2018-04-24 MED ORDER — OXYCODONE HCL 15 MG PO TABS: 15.0000 mg | ORAL_TABLET | Freq: Four times a day (QID) | ORAL | 0 refills | Status: DC | PRN

## 2018-04-24 NOTE — Assessment & Plan Note (Signed)
X ray

## 2018-04-24 NOTE — Telephone Encounter (Signed)
Patient asked if we could add one more week of PT because she is able to attend this week due to other appointments. She is currently going to [facility].

## 2018-04-24 NOTE — Assessment & Plan Note (Signed)
[name]

## 2018-04-24 NOTE — Assessment & Plan Note (Signed)
On Rx Wt loss

## 2018-04-24 NOTE — Progress Notes (Signed)
Subjective:  Patient ID: [name], female    DOB: Jun 02, 1951  Age: 67 y.o. MRN: [id number]  CC: No chief complaint on file.   HPI [name] presents for a fall at home last week - tripped and fell on the L knee, L arm, L chest - it hurts F/u vertigo - in PT, better  Outpatient Medications Prior to Visit  Medication Sig Dispense Refill  . amoxicillin (AMOXIL) 500 MG capsule TAKE 4 CAPSULES BY MOUTH 1 HOUR PRIOR TO DENTAL PROCEDURE/CLEANING 16 capsule 1  . blood glucose meter kit and supplies Dispense based on patient and insurance preference. Used to check blood sugar daily, DX: E11.9 OneTouch 1 each 0  . buPROPion (WELLBUTRIN XL) 150 MG 24 hr tablet Take 2 tablets (300 mg total) by mouth daily. 180 tablet 3  . carvedilol (COREG) 25 MG tablet TAKE 1/2 TABLET BY MOUTH 2 TIMES DAILY WITH A MEAL. 90 tablet 3  . cyclobenzaprine (FLEXERIL) 10 MG tablet TAKE 3 TABLETS BY MOUTH AT  BEDTIME 270 tablet 1  . DULoxetine (CYMBALTA) 60 MG capsule Take 1 capsule (60 mg total) by mouth at bedtime. 90 capsule 3  . furosemide (LASIX) 40 MG tablet Take 1-2 tablets (40-80 mg total) by mouth daily. 180 tablet 3  . glucose blood (ONE TOUCH TEST STRIPS) test strip Used to check blood sugar daily, DX: E11.9 100 each 3  . Lancets (ONETOUCH ULTRASOFT) lancets Used to check blood sugar daily, DX: E11.9 100 each 3  . lidocaine (XYLOCAINE) 5 % ointment Apply topically 3 (three) times daily as needed. 100 g 3  . LORazepam (ATIVAN) 2 MG tablet Take 0.5-1 tablets (1-2 mg total) by mouth every 6 (six) hours as needed for anxiety. 90 tablet 1  . Multiple Vitamin (MULTIVITAMIN WITH MINERALS) TABS tablet Take 1 tablet by mouth daily.    . Omega-3 300 MG CAPS Take 300 mg by mouth at bedtime.    Marland Kitchen oxyCODONE (ROXICODONE) 15 MG immediate release tablet Take 1 tablet (15 mg total) by mouth every 6 (six) hours as needed for pain. 120 tablet 0  . oxyCODONE ER (XTAMPZA ER) 18 MG C12A Take 1 tablet by mouth 2 (two) times  daily. 60 each 0  . pantoprazole (PROTONIX) 40 MG tablet Take 1 tablet (40 mg total) by mouth daily. 90 tablet 3  . pilocarpine (SALAGEN) 5 MG tablet Take 1 tablet (5 mg total) by mouth 2 (two) times daily. 180 tablet 3  . pioglitazone (ACTOS) 15 MG tablet Take 1 tablet (15 mg total) by mouth daily. 90 tablet 3  . polyvinyl alcohol (LIQUIFILM TEARS) 1.4 % ophthalmic solution Place 1 drop into both eyes daily as needed for dry eyes.    . PSYLLIUM PO Take 1 capsule by mouth 2 (two) times daily.    . VESICARE 5 MG tablet Take 1 tablet (5 mg total) by mouth daily. 90 tablet 1  . Vitamin D, Cholecalciferol, 1000 UNITS TABS Take 1,000 Units by mouth daily.     No facility-administered medications prior to visit.     ROS Review of Systems  Constitutional: Positive for fatigue. Negative for activity change, appetite change, chills and unexpected weight change.  HENT: Negative for congestion, mouth sores and sinus pressure.   Eyes: Negative for visual disturbance.  Respiratory: Negative for cough and chest tightness.   Cardiovascular: Positive for chest pain.  Gastrointestinal: Negative for abdominal pain and nausea.  Genitourinary: Negative for difficulty urinating, frequency and vaginal pain.  Musculoskeletal: Positive for arthralgias, back pain and gait problem.  Skin: Negative for pallor and rash.  Neurological: Positive for dizziness. Negative for tremors, weakness, numbness and headaches.  Psychiatric/Behavioral: Negative for confusion and sleep disturbance.    Objective:  BP 128/76 (BP Location: Left Arm, Patient Position: Sitting, Cuff Size: Large)   Pulse 78   Temp 98.1 F (36.7 C) (Oral)   Ht '5\' 2"'$  (1.575 m)   Wt 254 lb (115.2 kg)   SpO2 98%   BMI 46.46 kg/m   BP Readings from Last 3 Encounters:  04/24/18 128/76  03/20/18 126/72  02/08/18 130/76    Wt Readings from Last 3 Encounters:  04/24/18 254 lb (115.2 kg)  03/20/18 256 lb (116.1 kg)  02/08/18 257 lb (116.6 kg)     Physical Exam  Constitutional: She appears well-developed. No distress.  HENT:  Head: Normocephalic.  Right Ear: External ear normal.  Left Ear: External ear normal.  Nose: Nose normal.  Mouth/Throat: Oropharynx is clear and moist.  Eyes: Pupils are equal, round, and reactive to light. Conjunctivae are normal. Right eye exhibits no discharge. Left eye exhibits no discharge.  Neck: Normal range of motion. Neck supple. No JVD present. No tracheal deviation present. No thyromegaly present.  Cardiovascular: Normal rate, regular rhythm and normal heart sounds.  Pulmonary/Chest: No stridor. No respiratory distress. She has no wheezes.  Abdominal: Soft. Bowel sounds are normal. She exhibits no distension and no mass. There is no tenderness. There is no rebound and no guarding.  Musculoskeletal: She exhibits tenderness. She exhibits no edema.  Lymphadenopathy:    She has no cervical adenopathy.  Neurological: She displays normal reflexes. No cranial nerve deficit. She exhibits normal muscle tone. Coordination abnormal.  Skin: No rash noted. No erythema.  Psychiatric: She has a normal mood and affect. Her behavior is normal. Judgment and thought content normal.   Obese L chest wall  tender L>R knee - tender Cane  Lab Results  Component Value Date   WBC 8.9 10/13/2017   HGB 10.2 (L) 10/13/2017   HCT 31.6 (L) 10/13/2017   PLT 239.0 10/13/2017   GLUCOSE 156 (H) 03/20/2018   CHOL 215 (H) 09/25/2014   TRIG 154.0 (H) 09/25/2014   HDL 46.00 09/25/2014   LDLCALC 138 (H) 09/25/2014   ALT 10 08/20/2016   AST 12 08/20/2016   NA 135 03/20/2018   K 3.6 03/20/2018   CL 96 03/20/2018   CREATININE 2.00 (H) 03/20/2018   BUN 35 (H) 03/20/2018   CO2 29 03/20/2018   TSH 1.64 07/25/2013   INR 1.04 02/26/2015   HGBA1C 7.1 (H) 03/20/2018    Dg Cervical Spine 2 Or 3 Views  Result Date: 02/06/2018 CLINICAL DATA:  Neck pain for 3 days. EXAM: CERVICAL SPINE - 2-3 VIEW COMPARISON:  None.  FINDINGS: There is no evidence of cervical spine fracture or prevertebral soft tissue swelling. Alignment is normal. There are degenerative joint changes with narrowed joint space and osteophyte formation in the mid to lower cervical spine. IMPRESSION: No acute fracture or dislocation. Degenerative joint changes of cervical spine. Electronically Signed   By: Abelardo Diesel M.D.   On: 02/06/2018 20:18    Assessment & Plan:   There are no diagnoses linked to this encounter. I am having [name] maintain her polyvinyl alcohol, Vitamin D (Cholecalciferol), multivitamin with minerals, Omega-3, PSYLLIUM PO, carvedilol, DULoxetine, furosemide, glucose blood, onetouch ultrasoft, lidocaine, pantoprazole, pilocarpine, pioglitazone, blood glucose meter kit and supplies, amoxicillin, VESICARE, LORazepam, oxyCODONE,  oxyCODONE ER, cyclobenzaprine, and buPROPion.  No orders of the defined types were placed in this encounter.    Follow-up: No follow-ups on file.  [name], MD

## 2018-04-24 NOTE — Assessment & Plan Note (Signed)
L ankle elastic brace

## 2018-04-25 ENCOUNTER — Encounter: Payer: Medicare Other | Admitting: Physical Therapy

## 2018-04-26 MED FILL — oxyCODONE HCL 15 MG TABS: 15 | 30 days supply | Qty: 120 | Fill #0

## 2018-04-26 MED FILL — XTAMPZA ER 18 MG C12A: 18 | 30 days supply | Qty: 60 | Fill #0

## 2018-04-28 ENCOUNTER — Ambulatory Visit: Payer: Medicare Other | Admitting: Physical Therapy

## 2018-04-28 DIAGNOSIS — M75120 Complete rotator cuff tear or rupture of unspecified shoulder, not specified as traumatic: Secondary | ICD-10-CM | POA: Diagnosis not present

## 2018-05-01 DIAGNOSIS — I10 Essential (primary) hypertension: Secondary | ICD-10-CM | POA: Diagnosis not present

## 2018-05-01 DIAGNOSIS — N2581 Secondary hyperparathyroidism of renal origin: Secondary | ICD-10-CM | POA: Diagnosis not present

## 2018-05-01 DIAGNOSIS — N189 Chronic kidney disease, unspecified: Secondary | ICD-10-CM | POA: Diagnosis not present

## 2018-05-01 DIAGNOSIS — E1129 Type 2 diabetes mellitus with other diabetic kidney complication: Secondary | ICD-10-CM | POA: Diagnosis not present

## 2018-05-01 DIAGNOSIS — D631 Anemia in chronic kidney disease: Secondary | ICD-10-CM | POA: Diagnosis not present

## 2018-05-03 NOTE — Telephone Encounter (Signed)
Noted will add on when PT faxes order

## 2018-05-04 ENCOUNTER — Ambulatory Visit: Payer: Medicare Other | Admitting: Rehabilitative and Restorative Service Providers"

## 2018-05-11 ENCOUNTER — Ambulatory Visit: Payer: Medicare Other | Admitting: Rehabilitative and Restorative Service Providers"

## 2018-05-12 ENCOUNTER — Telehealth: Payer: Self-pay | Admitting: Internal Medicine

## 2018-05-12 MED ORDER — GLUCOSE BLOOD VI STRP: ORAL_STRIP | 3 refills | Status: DC

## 2018-05-12 NOTE — Telephone Encounter (Signed)
Correct test strips sent to [facility]

## 2018-05-12 NOTE — Telephone Encounter (Signed)
Copied from [place] [id number]. Topic: Quick Communication - Rx Refill/Question >> May 12, 2018 12:29 PM Lennox Solders wrote: Medication: true test strips. Pt has a true result meter   Has the patient contacted their pharmacy? No . Pt would like 100 testing strips (Agent: If yes, when and what did the pharmacy advise?)  Preferred Pharmacy (with phone number or street name): [facility] Agent: Please be advised that RX refills may take up to 3 business days. We ask that you follow-up with your pharmacy.

## 2018-05-18 ENCOUNTER — Encounter: Payer: Self-pay | Admitting: Physical Therapy

## 2018-05-18 ENCOUNTER — Ambulatory Visit: Payer: Medicare Other | Attending: Internal Medicine | Admitting: Physical Therapy

## 2018-05-18 DIAGNOSIS — M6281 Muscle weakness (generalized): Secondary | ICD-10-CM | POA: Insufficient documentation

## 2018-05-18 DIAGNOSIS — R42 Dizziness and giddiness: Secondary | ICD-10-CM | POA: Diagnosis not present

## 2018-05-18 DIAGNOSIS — H8111 Benign paroxysmal vertigo, right ear: Secondary | ICD-10-CM | POA: Insufficient documentation

## 2018-05-18 DIAGNOSIS — R262 Difficulty in walking, not elsewhere classified: Secondary | ICD-10-CM | POA: Diagnosis not present

## 2018-05-18 DIAGNOSIS — R2681 Unsteadiness on feet: Secondary | ICD-10-CM | POA: Diagnosis not present

## 2018-05-18 DIAGNOSIS — R2689 Other abnormalities of gait and mobility: Secondary | ICD-10-CM | POA: Diagnosis not present

## 2018-05-19 NOTE — Therapy (Signed)
[facility] [address] [place], [place], [address] Phone: [phone]   Fax:  [phone]  Physical Therapy Treatment  Patient Details  Name: [name] MRN: [id number] Date of Birth: 07-Apr-1951 Referring Provider: Cassandria Anger, MD   Encounter Date: 05/18/2018  PT End of Session - 05/19/18 1725    Visit Number  8    Number of Visits  13 eval plus 12 visits    Date for PT Re-Evaluation  06/28/18    Authorization Type  [employer] MCR    Authorization Time Period  03/30/18 to 06/28/18    PT Start Time  1405    PT Stop Time  1445    PT Time Calculation (min)  40 min    Activity Tolerance  Patient tolerated treatment well    Behavior During Therapy  [facility] for tasks assessed/performed labile       Past Medical History:  Diagnosis Date  . Anemia   . Anxiety   . Colitis 2010   microscopic- Dr Henrene Pastor  . Depression   . Diabetes mellitus    type II  . GERD (gastroesophageal reflux disease)   . Headache   . Hypertension   . IBS (irritable bowel syndrome)   . LBP (low back pain)   . Neuropathy    feet bilat   . Osteoarthritis   . Osteopenia   . Pneumonia    hx of 2014   . Renal insufficiency 2011  . Sinusitis    currently being treated with antibiotic will complete 03/04/2015    Past Surgical History:  Procedure Laterality Date  . foramen magnum ependymoma surgery  2003   Dr Rita Ohara  . JOINT REPLACEMENT Bilateral   . NASAL SINUS SURGERY     1973   . TONSILLECTOMY    . TOTAL KNEE ARTHROPLASTY     L 2008, R 2009, R 2016- Dr Maureen Ralphs  . TOTAL KNEE REVISION Right 03/05/2015   Procedure: RIGHT TOTAL KNEE ARTHROPLASTY REVISION;  Surgeon: Gaynelle Arabian, MD;  Location: [facility];  Service: Orthopedics;  Laterality: Right;    There were no vitals filed for this visit.  Subjective Assessment - 05/18/18 1408    Subjective  States she saw MD and did have 3 broken ribs (5,6,8). She has been unable to exercise. Then had  severe hip pain (as she has had in the past) last week. Dizziness much improved (spinning) lasting seconds. Going on vacation 6/25 thru 7/4.     Pertinent History  frequent falls, DM, neuropathy, HTN, knee OA, LBP, bil TKR, broke Lt foot, craniotomy 2003 (has been labile ever since)    Patient Stated Goals  figure out why she is falling and avoid falling; doesn't want to break anything    Currently in Pain?  No/denies    Pain Onset  --         [facility] PT Assessment - 05/19/18 0001      Functional Gait  Assessment   Gait Level Surface  Walks 20 ft in less than 7 sec but greater than 5.5 sec, uses assistive device, slower speed, mild gait deviations, or deviates 6-10 in outside of the 12 in walkway width.    Change in Gait Speed  Able to change speed, demonstrates mild gait deviations, deviates 6-10 in outside of the 12 in walkway width, or no gait deviations, unable to achieve a major change in velocity, or uses a change in velocity, or uses an assistive device.  Gait with Horizontal Head Turns  Performs head turns smoothly with slight change in gait velocity (eg, minor disruption to smooth gait path), deviates 6-10 in outside 12 in walkway width, or uses an assistive device.    Gait with Vertical Head Turns  Performs head turns with no change in gait. Deviates no more than 6 in outside 12 in walkway width.    Gait and Pivot Turn  Pivot turns safely within 3 sec and stops quickly with no loss of balance.    Step Over Obstacle  Is able to step over 2 stacked shoe boxes taped together (9 in total height) without changing gait speed. No evidence of imbalance.    Gait with Narrow Base of Support  Ambulates less than 4 steps heel to toe or cannot perform without assistance.    Gait with Eyes Closed  Walks 20 ft, slow speed, abnormal gait pattern, evidence for imbalance, deviates 10-15 in outside 12 in walkway width. Requires more than 9 sec to ambulate 20 ft.    Ambulating Backwards  Walks 20 ft, uses  assistive device, slower speed, mild gait deviations, deviates 6-10 in outside 12 in walkway width.    Steps  Two feet to a stair, must use rail.    Total Score  19         Vestibular Assessment - 05/18/18 1423      Dix-Hallpike Right   Dix-Hallpike Right Duration  0    Dix-Hallpike Right Symptoms  No nystagmus               [facility] Adult PT Treatment/Exercise - 05/19/18 0001      Ambulation/Gait   Gait velocity  32.8/11.47=2.86 ft/sec             PT Education - 05/19/18 1724    Education Details  results of STG assessment (improved despite recent setback with fall and broken ribs)    Person(s) Educated  Patient    Methods  Explanation    Comprehension  Verbalized understanding       PT Short Term Goals - 05/19/18 1726      PT SHORT TERM GOAL #1   Title  Patient will be independent with HEP (Target for all STGs 04/29/18)    Baseline  6/6 has not been able to do recently due to broken ribs    Time  4    Period  Weeks    Status  On-going      PT SHORT TERM GOAL #2   Title  Patient will improve FGA to >=14/30 to demonstrate decreasing fall risk.    Baseline  6/6  19/30    Time  4    Period  Weeks    Status  Achieved      PT SHORT TERM GOAL #3   Title  Patient will improve gait velocity to >2.4 ft/sec towards velocity indicative of safe community ambulation    Baseline  6/6  2.86 ft/sec    Time  4    Period  Weeks    Status  Achieved      PT SHORT TERM GOAL #4   Title  Patient will ascend/descend curb with LRAD modified independent.     Baseline  6/6 not assessed due to lack of time    Time  4    Period  Weeks    Status  Unable to assess      PT SHORT TERM GOAL #5   Title  Patient will perform  floor to chair transfer with min assist and max vc's for technique.     Baseline  Patient able to perform with demonstration, max verbal cues and CGA to min A from floor<>mat and floor<>chair.    Time  4    Period  Weeks    Status  Achieved        PT  Long Term Goals - 04/04/18 5462      PT LONG TERM GOAL #1   Title  Patient will be independent with updated HEP (Target all LTGs 05/29/18)    Time  8    Period  Weeks    Status  New      PT LONG TERM GOAL #2   Title  Patient will improve FGA to >=19/30 to demonstrate lesser fall risk    Time  8    Period  Weeks    Status  New      PT LONG TERM GOAL #3   Title  Patient will improve gait velocity with LRAD to >=2.62 ft/sec (indicative of safe community ambulation)    Time  8    Period  Weeks    Status  New      PT LONG TERM GOAL #4   Title  Patient will ambulate modified independent with LRAD x 500 ft over outdoor paved surfaces, including slopes, ramps, curbs.     Time  8    Period  Weeks    Status  New      PT LONG TERM GOAL #5   Title  Patient will perform floor to chair transfer with supervision and no vc's    Time  8    Period  Weeks    Status  New            Plan - 05/19/18 1731    Clinical Impression Statement  Patient returned after missing several weeks due to fall with broken ribs. She had not been present at time indicated for STGs and needed assessment of her current functional status after prolonged absence. She had a negative rt Dix-Hallpike and overall has improved since initial measurements were made on evaluation. Will need to discuss her desire to extend certification due to missed sessions (she currently has only 2 more scheduled appointments with LTGs ending 05/29/18.     PT Treatment/Interventions  ADLs/Self Care Home Management;Aquatic Therapy;Canalith Repostioning;Gait training;DME Instruction;Stair training;Functional mobility training;Therapeutic activities;Therapeutic exercise;Balance training;Neuromuscular re-education;Cognitive remediation;Patient/family education;Passive range of motion;Vestibular;Visual/perceptual remediation/compensation    PT Next Visit Plan  only RECHECK FOR BPPV if symptomatic (rt post canal); discuss ?need to add visits due to  multiple weeks missed after her fall (?if BPPV remains cleared if she would only need an additional 1-2 weeks? or she may to just go ahead and d/c on time??? work on compliant surfaces, EC, and hip, ankle strategies; add to HEP for balance and LE strength as approp (especially hip abduction);  encourage community exercise as patient willing to try    Consulted and Agree with Plan of Care  Patient       Patient will benefit from skilled therapeutic intervention in order to improve the following deficits and impairments:  Abnormal gait, Decreased balance, Decreased mobility, Decreased knowledge of use of DME, Decreased strength, Increased edema, Dizziness, Difficulty walking, Impaired sensation, Impaired vision/preception, Obesity  Visit Diagnosis: Unsteadiness on feet  Muscle weakness (generalized)  Other abnormalities of gait and mobility     Problem List Patient Active Problem List   Diagnosis Date Noted  .  Chest wall contusion, left, initial encounter 04/24/2018  . Falls frequently 03/20/2018  . Neck pain 02/08/2018  . CKD (chronic kidney disease) 10/13/2017  . 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 OA (osteoarthritis) of knee 03/05/2015  . Rib pain on left side 02/27/2015  . URI, acute 09/17/2013  . Swelling of limb-Bilateral leg Left > than right 07/11/2013  . Numbness-Left foot 07/11/2013  . Chronic venous insufficiency 04/19/2013  . Left hand pain 02/09/2013  . Right shoulder pain 09/12/2012  . Edema 04/24/2012  . Grief 11/10/2011  . ABSCESS, TOOTH 02/03/2011  . OSTEOPENIA 10/28/2010  . Fever 08/06/2010  . HYPERKALEMIA 07/15/2010  . ARTHRALGIA 04/08/2010  . Fatigue 04/08/2010  . XEROSTOMIA 02/04/2010  . ECZEMA 10/29/2009  . TOBACCO USE, QUIT 10/29/2009  . Anemia 06/18/2009  . 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 Diarrhea 06/18/2009  . OPACITY, VITREOUS HUMOR 01/15/2009  . COLITIS 10/16/2008  . KNEE PAIN 04/10/2008  .  HYPERCHOLESTEROLEMIA 01/10/2008  . Anxiety state 11/08/2007  . Depression 11/08/2007  . Osteoarthritis 11/08/2007  . LOW BACK PAIN 11/08/2007  . Diabetes mellitus type 2, controlled ([place]) 10/07/2007  . Essential hypertension 10/07/2007  . GERD 10/07/2007  . MICROALBUMINURIA 10/07/2007    Rexanne Mano, PT 05/19/2018, 5:40 PM  [facility] [address] [place], [place], [address] Phone: [phone]   Fax:  [phone]  Name: [name] MRN: [id number] Date of Birth: Apr 30, 1951

## 2018-05-25 ENCOUNTER — Ambulatory Visit: Payer: Medicare Other | Admitting: Rehabilitative and Restorative Service Providers"

## 2018-05-25 ENCOUNTER — Encounter: Payer: Self-pay | Admitting: Rehabilitative and Restorative Service Providers"

## 2018-05-25 DIAGNOSIS — R2681 Unsteadiness on feet: Secondary | ICD-10-CM | POA: Diagnosis not present

## 2018-05-25 DIAGNOSIS — M6281 Muscle weakness (generalized): Secondary | ICD-10-CM

## 2018-05-25 DIAGNOSIS — R42 Dizziness and giddiness: Secondary | ICD-10-CM | POA: Diagnosis not present

## 2018-05-25 DIAGNOSIS — H8111 Benign paroxysmal vertigo, right ear: Secondary | ICD-10-CM

## 2018-05-25 DIAGNOSIS — R2689 Other abnormalities of gait and mobility: Secondary | ICD-10-CM | POA: Diagnosis not present

## 2018-05-25 DIAGNOSIS — R262 Difficulty in walking, not elsewhere classified: Secondary | ICD-10-CM | POA: Diagnosis not present

## 2018-05-25 NOTE — Therapy (Signed)
[facility] [address] [place], [place], [address] Phone: [phone]   Fax:  [phone]  Physical Therapy Treatment  Patient Details  Name: [name] MRN: [id number] Date of Birth: Oct 13, 1951 Referring Provider: Cassandria Anger, MD   Encounter Date: 05/25/2018  PT End of Session - 05/25/18 1422    Visit Number  9    Number of Visits  13 eval plus 12 visits    Date for PT Re-Evaluation  06/28/18    Authorization Type  [employer] MCR    Authorization Time Period  03/30/18 to 06/28/18    PT Start Time  1410    PT Stop Time  1450    PT Time Calculation (min)  40 min    Equipment Utilized During Treatment  Gait belt    Activity Tolerance  Patient tolerated treatment well    Behavior During Therapy  [facility] for tasks assessed/performed labile       Past Medical History:  Diagnosis Date  . Anemia   . Anxiety   . Colitis 2010   microscopic- Dr Henrene Pastor  . Depression   . Diabetes mellitus    type II  . GERD (gastroesophageal reflux disease)   . Headache   . Hypertension   . IBS (irritable bowel syndrome)   . LBP (low back pain)   . Neuropathy    feet bilat   . Osteoarthritis   . Osteopenia   . Pneumonia    hx of 2014   . Renal insufficiency 2011  . Sinusitis    currently being treated with antibiotic will complete 03/04/2015    Past Surgical History:  Procedure Laterality Date  . foramen magnum ependymoma surgery  2003   Dr Rita Ohara  . JOINT REPLACEMENT Bilateral   . NASAL SINUS SURGERY     1973   . TONSILLECTOMY    . TOTAL KNEE ARTHROPLASTY     L 2008, R 2009, R 2016- Dr Maureen Ralphs  . TOTAL KNEE REVISION Right 03/05/2015   Procedure: RIGHT TOTAL KNEE ARTHROPLASTY REVISION;  Surgeon: Gaynelle Arabian, MD;  Location: [facility];  Service: Orthopedics;  Laterality: Right;    There were no vitals filed for this visit.  Subjective Assessment - 05/25/18 1416    Subjective  The patient reports the dizziness returned  over the weekend.  She notes it doesn't seem as bad as it was initially.  She noted a mild sensation this morning.     Pertinent History  frequent falls, DM, neuropathy, HTN, knee OA, LBP, bil TKR, broke Lt foot, craniotomy 2003 (has been labile ever since)    Patient Stated Goals  figure out why she is falling and avoid falling; doesn't want to break anything    Currently in Pain?  Yes    Pain Score  -- "My back is a little sore today."             Vestibular Assessment - 05/25/18 1429      Vestibular Assessment   General Observation  Patient notes a return of mild dizziness over the weekend.      Positional Testing   Sidelying Test  Sidelying Right;Sidelying Left      Sidelying Right   Sidelying Right Duration  10 seconds    Sidelying Right Symptoms  Upbeat, left rotatory nystagmus      Sidelying Left   Sidelying Left Duration  0    Sidelying Left Symptoms  No nystagmus  [place] [facility] Adult PT Treatment/Exercise - 05/25/18 2112      Ambulation/Gait   Ambulation/Gait  Yes    Ambulation/Gait Assistance  6: Modified independent (Device/Increase time)    Ambulation Distance (Feet)  600 Feet    Assistive device  Other (Comment) walking stick    Gait Pattern  Step-through pattern;Decreased arm swing - right;Decreased arm swing - left;Decreased step length - right;Decreased step length - left;Right foot flat;Left foot flat;Lateral trunk lean to right;Lateral trunk lean to left;Decreased trunk rotation;Wide base of support    Ambulation Surface  Level    Ramp  6: Modified independent (Device)    Curb  6: Modified independent (Device/increase time)      Self-Care   Self-Care  Other Self-Care Comments    Other Self-Care Comments   PT and patient discussed goals for physical therapy.  She notes she is increasing her walking due to upcoming travel (end of June) and the repair work at her house being completed.  PT and patient discussed continuing current HEP,  progressing general mobility and managing recurrent BPPV with habituation activities.      Vestibular Treatment/Exercise - 05/25/18 1431      Vestibular Treatment/Exercise   Vestibular Treatment Provided  Habituation    Habituation Exercises  Legrand Como Daroff   Number of Reps   3    Symptom Description   On 2nd rep, right sidelying--patient had mild nystagmus in the horizontal plane.  None to the left.  No nystagmus on the 3rd repetition.            PT Education - 05/25/18 1438    Education provided  Yes    Education Details  habituation brandt daroff    Person(s) Educated  Patient    Methods  Explanation;Handout    Comprehension  Verbalized understanding;Returned demonstration       PT Short Term Goals - 05/25/18 1423      PT SHORT TERM GOAL #1   Title  Patient will be independent with HEP (Target for all STGs 04/29/18)    Baseline  6/6 has not been able to do recently due to broken ribs  On 6/13, patient notes she is able to return to doing HEP    Time  4    Period  Weeks    Status  Achieved      PT SHORT TERM GOAL #2   Title  Patient will improve FGA to >=14/30 to demonstrate decreasing fall risk.    Baseline  6/6  19/30    Time  4    Period  Weeks    Status  Achieved      PT SHORT TERM GOAL #3   Title  Patient will improve gait velocity to >2.4 ft/sec towards velocity indicative of safe community ambulation    Baseline  6/6  2.86 ft/sec     (2.3 ft/sec on 05/25/18)    Time  4    Period  Weeks    Status  Achieved      PT SHORT TERM GOAL #4   Title  Patient will ascend/descend curb with LRAD modified independent.     Baseline  6/6 not assessed due to lack of time    Time  4    Period  Weeks    Status  Achieved      PT SHORT TERM GOAL #5   Title  Patient will perform floor to chair transfer with min assist and max vc's  for technique.     Baseline  Patient able to perform with demonstration, max verbal cues and CGA to min A from floor<>mat  and floor<>chair.    Time  4    Period  Weeks    Status  Achieved        PT Long Term Goals - 05/25/18 1448      PT LONG TERM GOAL #1   Title  Patient will be independent with updated HEP (Target all LTGs 05/29/18)    Time  8    Period  Weeks    Status  New      PT LONG TERM GOAL #2   Title  Patient will improve FGA to >=19/30 to demonstrate lesser fall risk    Time  8    Period  Weeks    Status  New      PT LONG TERM GOAL #3   Title  Patient will improve gait velocity with LRAD to >=2.62 ft/sec (indicative of safe community ambulation)    Time  8    Period  Weeks    Status  New      PT LONG TERM GOAL #4   Title  Patient will ambulate modified independent with LRAD x 500 ft over outdoor paved surfaces, including slopes, ramps, curbs.     Time  8    Period  Weeks    Status  New      PT LONG TERM GOAL #5   Title  Patient will perform floor to chair transfer with supervision and no vc's    Time  8    Period  Weeks    Status  New            Plan - 05/25/18 2122    Clinical Impression Statement  PT and patient discussed discharge versus renewal for next session.  PT to recheck BPPV and ensure patient able to perform habituation exercises for self mgmt of recurrent BPPV.  Gait deficits are multi-factorial in nature and recent fall related to home environment hazards (during a renovation).  PT to check LTGs next visit.     PT Treatment/Interventions  ADLs/Self Care Home Management;Aquatic Therapy;Canalith Repostioning;Gait training;DME Instruction;Stair training;Functional mobility training;Therapeutic activities;Therapeutic exercise;Balance training;Neuromuscular re-education;Cognitive remediation;Patient/family education;Passive range of motion;Vestibular;Visual/perceptual remediation/compensation    PT Next Visit Plan  Check ability to perform habituation, check LTGs.  Patient thinking of further goals she would have for PT vs d/c from PT for next visit.    ? consider  community exercise (PREP class at [facility], or [facility]?)    Consulted and Agree with Plan of Care  Patient       Patient will benefit from skilled therapeutic intervention in order to improve the following deficits and impairments:  Abnormal gait, Decreased balance, Decreased mobility, Decreased knowledge of use of DME, Decreased strength, Increased edema, Dizziness, Difficulty walking, Impaired sensation, Impaired vision/preception, Obesity  Visit Diagnosis: Unsteadiness on feet  Muscle weakness (generalized)  Other abnormalities of gait and mobility  Dizziness and giddiness  BPPV (benign paroxysmal positional vertigo), right     Problem List Patient Active Problem List   Diagnosis Date Noted  . Chest wall contusion, left, initial encounter 04/24/2018  . Falls frequently 03/20/2018  . Neck pain 02/08/2018  . CKD (chronic kidney disease) 10/13/2017  . Failed total right knee replacement ([place]) 03/05/2015  . OA (osteoarthritis) of knee 03/05/2015  . Rib pain on left side 02/27/2015  . URI, acute 09/17/2013  . Swelling of  limb-Bilateral leg Left > than right 07/11/2013  . Numbness-Left foot 07/11/2013  . Chronic venous insufficiency 04/19/2013  . Left hand pain 02/09/2013  . Right shoulder pain 09/12/2012  . Edema 04/24/2012  . Grief 11/10/2011  . ABSCESS, TOOTH 02/03/2011  . OSTEOPENIA 10/28/2010  . Fever 08/06/2010  . HYPERKALEMIA 07/15/2010  . ARTHRALGIA 04/08/2010  . Fatigue 04/08/2010  . XEROSTOMIA 02/04/2010  . ECZEMA 10/29/2009  . TOBACCO USE, QUIT 10/29/2009  . Anemia 06/18/2009  . 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 Diarrhea 06/18/2009  . OPACITY, VITREOUS HUMOR 01/15/2009  . COLITIS 10/16/2008  . KNEE PAIN 04/10/2008  . HYPERCHOLESTEROLEMIA 01/10/2008  . Anxiety state 11/08/2007  . Depression 11/08/2007  . Osteoarthritis 11/08/2007  . LOW BACK PAIN 11/08/2007  . Diabetes mellitus type 2, controlled ([place]) 10/07/2007  .  Essential hypertension 10/07/2007  . GERD 10/07/2007  . MICROALBUMINURIA 10/07/2007    WEAVER,CHRISTINA, PT 05/25/2018, 9:27 PM  [facility] [address] [place], [place], [address] Phone: [phone]   Fax:  [phone]  Name: WILLYE JAVIER MRN: [id number] Date of Birth: 09-Aug-1951

## 2018-05-25 NOTE — Patient Instructions (Signed)
Access Code: [email or web address]  URL: [email or web address]  Date: 05/25/2018  Prepared by: Rudell Cobb   Exercises Brandt-Daroff Vestibular Exercise - 5 reps - 3x daily - 7x weekly

## 2018-05-26 ENCOUNTER — Other Ambulatory Visit: Payer: Self-pay | Admitting: Internal Medicine

## 2018-05-29 MED FILL — XTAMPZA ER 18 MG C12A: 18 | 30 days supply | Qty: 60 | Fill #0

## 2018-05-29 MED FILL — oxyCODONE HCL 15 MG TABS: 15 | 30 days supply | Qty: 120 | Fill #0

## 2018-06-01 ENCOUNTER — Ambulatory Visit: Payer: Medicare Other | Admitting: Physical Therapy

## 2018-06-01 ENCOUNTER — Encounter: Payer: Self-pay | Admitting: Physical Therapy

## 2018-06-01 DIAGNOSIS — M6281 Muscle weakness (generalized): Secondary | ICD-10-CM | POA: Diagnosis not present

## 2018-06-01 DIAGNOSIS — R262 Difficulty in walking, not elsewhere classified: Secondary | ICD-10-CM | POA: Diagnosis not present

## 2018-06-01 DIAGNOSIS — R42 Dizziness and giddiness: Secondary | ICD-10-CM | POA: Diagnosis not present

## 2018-06-01 DIAGNOSIS — R2681 Unsteadiness on feet: Secondary | ICD-10-CM

## 2018-06-01 DIAGNOSIS — H8111 Benign paroxysmal vertigo, right ear: Secondary | ICD-10-CM | POA: Diagnosis not present

## 2018-06-01 DIAGNOSIS — R2689 Other abnormalities of gait and mobility: Secondary | ICD-10-CM | POA: Diagnosis not present

## 2018-06-01 NOTE — Therapy (Signed)
[facility] [address] [place], [place], [address] Phone: [phone]   Fax:  [phone]  Physical Therapy Treatment and Discharge Summary  Patient Details  Name: [name] MRN: [id number] Date of Birth: [birth year] Referring Provider: Cassandria Anger, MD   Encounter Date: 06/01/2018  PT End of Session - 06/01/18 1442    Visit Number  10    Number of Visits  13 eval plus 12 visits    Date for PT Re-Evaluation  06/28/18    Authorization Type  [employer] MCR    Authorization Time Period  03/30/18 to 06/28/18    PT Start Time  1401    PT Stop Time  1440    PT Time Calculation (min)  39 min    Activity Tolerance  Patient tolerated treatment well    Behavior During Therapy  [facility] for tasks assessed/performed labile       Past Medical History:  Diagnosis Date  . Anemia   . Anxiety   . Colitis 2010   microscopic- Dr Henrene Pastor  . Depression   . Diabetes mellitus    type II  . GERD (gastroesophageal reflux disease)   . Headache   . Hypertension   . IBS (irritable bowel syndrome)   . LBP (low back pain)   . Neuropathy    feet bilat   . Osteoarthritis   . Osteopenia   . Pneumonia    hx of 2014   . Renal insufficiency 2011  . Sinusitis    currently being treated with antibiotic will complete 03/04/2015    Past Surgical History:  Procedure Laterality Date  . foramen magnum ependymoma surgery  2003   Dr Rita Ohara  . JOINT REPLACEMENT Bilateral   . NASAL SINUS SURGERY     1973   . TONSILLECTOMY    . TOTAL KNEE ARTHROPLASTY     L 2008, R 2009, R 2016- Dr Maureen Ralphs  . TOTAL KNEE REVISION Right 03/05/2015   Procedure: RIGHT TOTAL KNEE ARTHROPLASTY REVISION;  Surgeon: Gaynelle Arabian, MD;  Location: [facility];  Service: Orthopedics;  Laterality: Right;    There were no vitals filed for this visit.  Subjective Assessment - 06/01/18 1404    Subjective  Doing better. Think its because she has been moving more. Getting ready  for her trip has had her moving more. She's been doing her Brandt-Daroff exercises. Doing the habituation bend forward (no symptoms with this). Doing her sidelying clam.     Pertinent History  frequent falls, DM, neuropathy, HTN, knee OA, LBP, bil TKR, broke Lt foot, craniotomy 2003 (has been labile ever since)    Patient Stated Goals  figure out why she is falling and avoid falling; doesn't want to break anything    Currently in Pain?  Yes    Pain Score  3     Pain Location  Hip    Pain Orientation  Left    Pain Descriptors / Indicators  Aching    Pain Type  Chronic pain    Pain Onset  More than a month ago    Pain Frequency  Intermittent         [facility] PT Assessment - 06/01/18 1427      Functional Gait  Assessment   Gait Level Surface  Walks 20 ft in less than 7 sec but greater than 5.5 sec, uses assistive device, slower speed, mild gait deviations, or deviates 6-10 in outside of the 12 in walkway width.  Change in Gait Speed  Able to change speed, demonstrates mild gait deviations, deviates 6-10 in outside of the 12 in walkway width, or no gait deviations, unable to achieve a major change in velocity, or uses a change in velocity, or uses an assistive device.    Gait with Horizontal Head Turns  Performs head turns smoothly with slight change in gait velocity (eg, minor disruption to smooth gait path), deviates 6-10 in outside 12 in walkway width, or uses an assistive device.    Gait with Vertical Head Turns  Performs head turns with no change in gait. Deviates no more than 6 in outside 12 in walkway width.    Gait and Pivot Turn  Pivot turns safely within 3 sec and stops quickly with no loss of balance.    Step Over Obstacle  Is able to step over one shoe box (4.5 in total height) without changing gait speed. No evidence of imbalance.    Gait with Narrow Base of Support  Ambulates less than 4 steps heel to toe or cannot perform without assistance.    Gait with Eyes Closed  Walks 20 ft,  uses assistive device, slower speed, mild gait deviations, deviates 6-10 in outside 12 in walkway width. Ambulates 20 ft in less than 9 sec but greater than 7 sec.    Ambulating Backwards  Walks 20 ft, uses assistive device, slower speed, mild gait deviations, deviates 6-10 in outside 12 in walkway width.    Steps  Two feet to a stair, must use rail.    Total Score  19                   [facility] Adult PT Treatment/Exercise - 06/01/18 0001      Ambulation/Gait   Ambulation/Gait Assistance  6: Modified independent (Device/Increase time)    Ambulation Distance (Feet)  400 Feet    Assistive device  Other (Comment) walking stick    Gait Pattern  Step-through pattern;Decreased arm swing - right;Decreased arm swing - left;Decreased step length - right;Decreased step length - left;Right foot flat;Left foot flat;Lateral trunk lean to right;Lateral trunk lean to left;Decreased trunk rotation;Wide base of support    Ambulation Surface  Unlevel;Level;Indoor;Outdoor;Paved;Grass;Gravel;Other (comment) rubber mulch    Gait velocity  32.8/14=2.34               PT Short Term Goals - 05/25/18 1423      PT SHORT TERM GOAL #1   Title  Patient will be independent with HEP (Target for all STGs 04/29/18)    Baseline  6/6 has not been able to do recently due to broken ribs  On 6/13, patient notes she is able to return to doing HEP    Time  4    Period  Weeks    Status  Achieved      PT SHORT TERM GOAL #2   Title  Patient will improve FGA to >=14/30 to demonstrate decreasing fall risk.    Baseline  6/6  19/30    Time  4    Period  Weeks    Status  Achieved      PT SHORT TERM GOAL #3   Title  Patient will improve gait velocity to >2.4 ft/sec towards velocity indicative of safe community ambulation    Baseline  6/6  2.86 ft/sec     (2.3 ft/sec on 05/25/18)    Time  4    Period  Weeks    Status  Achieved  PT SHORT TERM GOAL #4   Title  Patient will ascend/descend curb with LRAD  modified independent.     Baseline  6/6 not assessed due to lack of time    Time  4    Period  Weeks    Status  Achieved      PT SHORT TERM GOAL #5   Title  Patient will perform floor to chair transfer with min assist and max vc's for technique.     Baseline  Patient able to perform with demonstration, max verbal cues and CGA to min A from floor<>mat and floor<>chair.    Time  4    Period  Weeks    Status  Achieved        PT Long Term Goals - 06/01/18 1443      PT LONG TERM GOAL #1   Title  Patient will be independent with updated HEP (Target all LTGs 05/29/18)    Time  8    Period  Weeks    Status  Achieved      PT LONG TERM GOAL #2   Title  Patient will improve FGA to >=19/30 to demonstrate lesser fall risk    Baseline  6/20 FGA 19/30    Time  8    Period  Weeks    Status  Not Met      PT LONG TERM GOAL #3   Title  Patient will improve gait velocity with LRAD to >=2.62 ft/sec (indicative of safe community ambulation)    Baseline  6/20 2.43 ft/sec (hip pain today that has not had previously with decr velocity); 6/7 2.86 ft/sec    Time  8    Period  Weeks    Status  Partially Met      PT LONG TERM GOAL #4   Title  Patient will ambulate modified independent with LRAD x 500 ft over outdoor paved surfaces, including slopes, ramps, curbs.     Baseline  6/20 met at 400 ft (limited due to impending rain)    Time  8    Period  Weeks    Status  Achieved      PT LONG TERM GOAL #5   Title  Patient will perform floor to chair transfer with supervision and no vc's    Baseline  5/7 done with minguard assist x2; 6/20 verbalized technique but she deferred demonstrating    Time  8    Period  Weeks    Status  Partially Met            Plan - 06/01/18 1443    Clinical Impression Statement  LTGs assessed with pt meeting 2 of 5, partially met 2 of 5 (progress, but not consistently to goal level), and one unmet goal (no change on FGA score from STG to LTG). Patient concerned over  her upcoming trip to [place] where she will be on a smaller cruise ship and do shore excursions. Educated on walking on unlevel surfaces and encouraged her to take BOTH walking sticks with her for shore excursions (she tends to only use one stick currently, however unknown walking surfaces on tours). Patient could state no further goals for PT that we have not addressed and feels prepared for discharge. She reports she feels so much better after PT and is able to tolerate increased activity without dizziness/falls.     PT Treatment/Interventions  ADLs/Self Care Home Management;Aquatic Therapy;Canalith Repostioning;Gait training;DME Instruction;Stair training;Functional mobility training;Therapeutic activities;Therapeutic exercise;Balance training;Neuromuscular re-education;Cognitive remediation;Patient/family education;Passive range  of motion;Vestibular;Visual/perceptual remediation/compensation    PT Next Visit Plan  --    Consulted and Agree with Plan of Care  Patient       Patient will benefit from skilled therapeutic intervention in order to improve the following deficits and impairments:  Abnormal gait, Decreased balance, Decreased mobility, Decreased knowledge of use of DME, Decreased strength, Increased edema, Dizziness, Difficulty walking, Impaired sensation, Impaired vision/preception, Obesity  Visit Diagnosis: Unsteadiness on feet  Difficulty in walking, not elsewhere classified     Problem List Patient Active Problem List   Diagnosis Date Noted  . Chest wall contusion, left, initial encounter 04/24/2018  . Falls frequently 03/20/2018  . Neck pain 02/08/2018  . CKD (chronic kidney disease) 10/13/2017  . Failed total right knee replacement ([place]) 03/05/2015  . OA (osteoarthritis) of knee 03/05/2015  . Rib pain on left side 02/27/2015  . URI, acute 09/17/2013  . Swelling of limb-Bilateral leg Left > than right 07/11/2013  . Numbness-Left foot 07/11/2013  . Chronic venous  insufficiency 04/19/2013  . Left hand pain 02/09/2013  . Right shoulder pain 09/12/2012  . Edema 04/24/2012  . Grief 11/10/2011  . ABSCESS, TOOTH 02/03/2011  . OSTEOPENIA 10/28/2010  . Fever 08/06/2010  . HYPERKALEMIA 07/15/2010  . ARTHRALGIA 04/08/2010  . Fatigue 04/08/2010  . XEROSTOMIA 02/04/2010  . ECZEMA 10/29/2009  . TOBACCO USE, QUIT 10/29/2009  . Anemia 06/18/2009  . 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 Diarrhea 06/18/2009  . OPACITY, VITREOUS HUMOR 01/15/2009  . COLITIS 10/16/2008  . KNEE PAIN 04/10/2008  . HYPERCHOLESTEROLEMIA 01/10/2008  . Anxiety state 11/08/2007  . Depression 11/08/2007  . Osteoarthritis 11/08/2007  . LOW BACK PAIN 11/08/2007  . Diabetes mellitus type 2, controlled ([place]) 10/07/2007  . Essential hypertension 10/07/2007  . GERD 10/07/2007  . MICROALBUMINURIA 10/07/2007   PHYSICAL THERAPY DISCHARGE SUMMARY  Visits from Start of Care: 10  Current functional level related to goals / functional outcomes: See LTGs above; walking with walking stick in community   Remaining deficits: Occasional dizziness with habituation exercises--inconsistent and much less than prior to PT   Education / Equipment: HEP, use of assistive device  Plan: Patient agrees to discharge.  Patient goals were not met. Patient is being discharged due to meeting the stated rehab goals.  ?????         Rexanne Mano, PT 06/01/2018, 3:00 PM  [facility] [address] [place], [place], [address] Phone: [phone]   Fax:  [phone]  Name: [name] MRN: [id number] Date of Birth: 04/15/1951

## 2018-06-19 ENCOUNTER — Ambulatory Visit: Payer: Medicare Other | Admitting: Internal Medicine

## 2018-06-28 MED FILL — oxyCODONE HCL 15 MG TABS: 15 | 30 days supply | Qty: 120 | Fill #0

## 2018-07-11 ENCOUNTER — Ambulatory Visit (INDEPENDENT_AMBULATORY_CARE_PROVIDER_SITE_OTHER): Payer: Medicare Other | Admitting: Internal Medicine

## 2018-07-11 ENCOUNTER — Encounter: Payer: Self-pay | Admitting: Internal Medicine

## 2018-07-11 ENCOUNTER — Other Ambulatory Visit (INDEPENDENT_AMBULATORY_CARE_PROVIDER_SITE_OTHER): Payer: Medicare Other

## 2018-07-11 DIAGNOSIS — F411 Generalized anxiety disorder: Secondary | ICD-10-CM

## 2018-07-11 DIAGNOSIS — M545 Low back pain, unspecified: Secondary | ICD-10-CM

## 2018-07-11 DIAGNOSIS — N184 Chronic kidney disease, stage 4 (severe): Secondary | ICD-10-CM | POA: Diagnosis not present

## 2018-07-11 DIAGNOSIS — M170 Bilateral primary osteoarthritis of knee: Secondary | ICD-10-CM | POA: Diagnosis not present

## 2018-07-11 DIAGNOSIS — E119 Type 2 diabetes mellitus without complications: Secondary | ICD-10-CM

## 2018-07-11 DIAGNOSIS — G8929 Other chronic pain: Secondary | ICD-10-CM | POA: Diagnosis not present

## 2018-07-11 DIAGNOSIS — F332 Major depressive disorder, recurrent severe without psychotic features: Secondary | ICD-10-CM

## 2018-07-11 DIAGNOSIS — I10 Essential (primary) hypertension: Secondary | ICD-10-CM

## 2018-07-11 LAB — HEMOGLOBIN A1C: Hgb A1c MFr Bld: 7.1 % — ABNORMAL HIGH (ref 4.6–6.5)

## 2018-07-11 LAB — BASIC METABOLIC PANEL
BUN: 42 mg/dL — ABNORMAL HIGH (ref 6–23)
CO2: 29 mEq/L (ref 19–32)
Calcium: 9.9 mg/dL (ref 8.4–10.5)
Chloride: 98 mEq/L (ref 96–112)
Creatinine, Ser: 2.09 mg/dL — ABNORMAL HIGH (ref 0.40–1.20)
GFR: 25.15 mL/min — ABNORMAL LOW (ref >60.00)
Glucose, Bld: 180 mg/dL — ABNORMAL HIGH (ref 70–99)
Potassium: 4.5 mEq/L (ref 3.5–5.1)
Sodium: 135 mEq/L (ref 135–145)

## 2018-07-11 MED ORDER — OXYCODONE HCL 15 MG PO TABS: 15.0000 mg | ORAL_TABLET | Freq: Four times a day (QID) | ORAL | 0 refills | Status: DC | PRN

## 2018-07-11 MED ORDER — ZOSTER VAC RECOMB ADJUVANTED 50 MCG/0.5ML IM SUSR: 0.5000 mL | Freq: Once | INTRAMUSCULAR | 1 refills | Status: AC

## 2018-07-11 NOTE — Assessment & Plan Note (Signed)
Not to take w/Oxycod and Oxycont Lorazepam

## 2018-07-11 NOTE — Assessment & Plan Note (Signed)
Glimepiride, Actos

## 2018-07-11 NOTE — Assessment & Plan Note (Signed)
wellbutrin ° °

## 2018-07-11 NOTE — Assessment & Plan Note (Signed)
Coreg

## 2018-07-11 NOTE — Assessment & Plan Note (Signed)
F/u w/Nephrology 

## 2018-07-11 NOTE — Assessment & Plan Note (Signed)
Oxycod and Oxycont Not to take w/Lorazepam  Potential benefits of a long term opioids use as well as potential risks (i.e. addiction risk, apnea etc) and complications (i.e. Somnolence, constipation and others) were explained to the patient and were aknowledged.

## 2018-07-11 NOTE — Progress Notes (Signed)
Subjective:  Patient ID: [name], female    DOB: [birth year]  Age: 67 y.o. MRN: [id number]  CC: No chief complaint on file.   HPI [name] presents for LBP, knee OA, depression, anxiety and DM f/u Came back from [place]  Outpatient Medications Prior to Visit  Medication Sig Dispense Refill  . amoxicillin (AMOXIL) 500 MG capsule TAKE 4 CAPSULES BY MOUTH 1  HOUR PRIOR TO DENTAL  PROCEDURE/CLEANING 16 capsule 1  . blood glucose meter kit and supplies Dispense based on patient and insurance preference. Used to check blood sugar daily, DX: E11.9 OneTouch 1 each 0  . buPROPion (WELLBUTRIN XL) 150 MG 24 hr tablet Take 2 tablets (300 mg total) by mouth daily. 180 tablet 3  . carvedilol (COREG) 25 MG tablet TAKE 1/2 TABLET BY MOUTH 2 TIMES DAILY WITH A MEAL. 90 tablet 3  . cyclobenzaprine (FLEXERIL) 10 MG tablet TAKE 3 TABLETS BY MOUTH AT  BEDTIME 270 tablet 1  . DULoxetine (CYMBALTA) 60 MG capsule Take 1 capsule (60 mg total) by mouth at bedtime. 90 capsule 3  . furosemide (LASIX) 40 MG tablet Take 1-2 tablets (40-80 mg total) by mouth daily. 180 tablet 3  . glimepiride (AMARYL) 1 MG tablet Take 1 mg by mouth daily with breakfast.    . glucose blood (TRUETEST TEST) test strip Used to check blood sugar daily, DX: E11.9 100 each 3  . Lancets (ONETOUCH ULTRASOFT) lancets Used to check blood sugar daily, DX: E11.9 100 each 3  . lidocaine (XYLOCAINE) 5 % ointment Apply topically 3 (three) times daily as needed. 100 g 3  . LORazepam (ATIVAN) 2 MG tablet TAKE 1/2 TO 1 TABLET BY  MOUTH EVERY 6 HOURS AS  NEEDED FOR ANXIETY 90 tablet 1  . Multiple Vitamin (MULTIVITAMIN WITH MINERALS) TABS tablet Take 1 tablet by mouth daily.    . Omega-3 300 MG CAPS Take 300 mg by mouth at bedtime.    Marland Kitchen oxyCODONE (ROXICODONE) 15 MG immediate release tablet Take 1 tablet (15 mg total) by mouth every 6 (six) hours as needed for pain. 120 tablet 0  . pantoprazole (PROTONIX) 40 MG tablet Take 1 tablet (40 mg total)  by mouth daily. 90 tablet 3  . pilocarpine (SALAGEN) 5 MG tablet Take 1 tablet (5 mg total) by mouth 2 (two) times daily. 180 tablet 3  . pioglitazone (ACTOS) 15 MG tablet Take 1 tablet (15 mg total) by mouth daily. 90 tablet 3  . polyvinyl alcohol (LIQUIFILM TEARS) 1.4 % ophthalmic solution Place 1 drop into both eyes daily as needed for dry eyes.    . PSYLLIUM PO Take 1 capsule by mouth 2 (two) times daily.    . VESICARE 5 MG tablet TAKE 1 TABLET BY MOUTH  DAILY 90 tablet 3  . Vitamin D, Cholecalciferol, 1000 UNITS TABS Take 1,000 Units by mouth daily.    Marland Kitchen oxyCODONE ER (XTAMPZA ER) 18 MG C12A Take 1 tablet by mouth 2 (two) times daily. (Patient not taking: Reported on 07/11/2018) 60 each 0   No facility-administered medications prior to visit.     ROS: Review of Systems  Constitutional: Positive for fatigue. Negative for activity change, appetite change, chills and unexpected weight change.  HENT: Negative for congestion, mouth sores and sinus pressure.   Eyes: Negative for visual disturbance.  Respiratory: Negative for cough and chest tightness.   Gastrointestinal: Negative for abdominal pain and nausea.  Genitourinary: Negative for difficulty urinating, frequency and vaginal pain.  Musculoskeletal: Positive for arthralgias, back pain and gait problem.  Skin: Negative for pallor and rash.  Neurological: Positive for weakness. Negative for dizziness, tremors, numbness and headaches.  Psychiatric/Behavioral: Positive for dysphoric mood. Negative for confusion, sleep disturbance and suicidal ideas. The patient is nervous/anxious.     Objective:  BP 124/72 (BP Location: Left Arm, Patient Position: Sitting, Cuff Size: Large)   Pulse 74   Temp 98.4 F (36.9 C) (Oral)   Ht '5\' 2"'$  (1.575 m)   Wt 243 lb (110.2 kg)   SpO2 96%   BMI 44.45 kg/m   BP Readings from Last 3 Encounters:  07/11/18 124/72  04/24/18 128/76  03/20/18 126/72    Wt Readings from Last 3 Encounters:  07/11/18  243 lb (110.2 kg)  04/24/18 254 lb (115.2 kg)  03/20/18 256 lb (116.1 kg)    Physical Exam  Constitutional: She appears well-developed. No distress.  HENT:  Head: Normocephalic.  Right Ear: External ear normal.  Left Ear: External ear normal.  Nose: Nose normal.  Mouth/Throat: Oropharynx is clear and moist.  Eyes: Pupils are equal, round, and reactive to light. Conjunctivae are normal. Right eye exhibits no discharge. Left eye exhibits no discharge.  Neck: Normal range of motion. Neck supple. No JVD present. No tracheal deviation present. No thyromegaly present.  Cardiovascular: Normal rate, regular rhythm and normal heart sounds.  Pulmonary/Chest: No stridor. No respiratory distress. She has no wheezes.  Abdominal: Soft. Bowel sounds are normal. She exhibits no distension and no mass. There is no tenderness. There is no rebound and no guarding.  Musculoskeletal: She exhibits tenderness. She exhibits no edema.  Lymphadenopathy:    She has no cervical adenopathy.  Neurological: She displays normal reflexes. No cranial nerve deficit. She exhibits normal muscle tone. Coordination abnormal.  Skin: No rash noted. No erythema.  Psychiatric: She has a normal mood and affect. Her behavior is normal. Judgment and thought content normal.  ataxic LS, knees - painful  Lab Results  Component Value Date   WBC 8.9 10/13/2017   HGB 10.2 (L) 10/13/2017   HCT 31.6 (L) 10/13/2017   PLT 239.0 10/13/2017   GLUCOSE 156 (H) 03/20/2018   CHOL 215 (H) 09/25/2014   TRIG 154.0 (H) 09/25/2014   HDL 46.00 09/25/2014   LDLCALC 138 (H) 09/25/2014   ALT 10 08/20/2016   AST 12 08/20/2016   NA 135 03/20/2018   K 3.6 03/20/2018   CL 96 03/20/2018   CREATININE 2.00 (H) 03/20/2018   BUN 35 (H) 03/20/2018   CO2 29 03/20/2018   TSH 1.64 07/25/2013   INR 1.04 02/26/2015   HGBA1C 7.1 (H) 03/20/2018    Dg Ribs Unilateral Left  Result Date: 04/24/2018 CLINICAL DATA:  Fall with chest wall pain EXAM: LEFT  RIBS - 2 VIEW COMPARISON:  Chest x-ray 03/01/2016 FINDINGS: No pneumothorax or pleural effusion. Atelectasis at the left lung base. Acute displaced left fourth, fifth, sixth, and eighth rib fractures. IMPRESSION: 1. Acute displaced left fourth, fifth, sixth and eighth rib fractures. 2. No definitive left pneumothorax or pleural effusion seen. These results will be called to the ordering clinician or representative by the Radiologist Assistant, and communication documented in the PACS or zVision Dashboard. Electronically Signed   By: Donavan Foil M.D.   On: 04/24/2018 20:59    Assessment & Plan:   There are no diagnoses linked to this encounter.   No orders of the defined types were placed in this encounter.    Follow-up: No  follow-ups on file.  [name], MD

## 2018-07-12 LAB — PMP SCREEN PROFILE (10S), URINE
Amphetamine Scrn, Ur: NEGATIVE ng/mL
BARBITURATE SCREEN URINE: NEGATIVE ng/mL
BENZODIAZEPINE SCREEN, URINE: NEGATIVE ng/mL
CANNABINOIDS UR QL SCN: NEGATIVE ng/mL
Cocaine (Metab) Scrn, Ur: NEGATIVE ng/mL
Creatinine(Crt), U: 26.3 mg/dL (ref 20.0–300.0)
Methadone Screen, Urine: NEGATIVE ng/mL
OXYCODONE+OXYMORPHONE UR QL SCN: POSITIVE ng/mL — AB
Opiate Scrn, Ur: POSITIVE ng/mL — AB
Ph of Urine: 5.4 (ref 4.5–8.9)
Phencyclidine Qn, Ur: NEGATIVE ng/mL
Propoxyphene Scrn, Ur: NEGATIVE ng/mL

## 2018-07-25 ENCOUNTER — Telehealth: Payer: Self-pay | Admitting: Internal Medicine

## 2018-07-25 NOTE — Telephone Encounter (Signed)
Copied from [place] [id number]. Topic: Quick Communication - See Telephone Encounter >> Jul 19, 2018 12:06 PM Antonieta Iba C wrote: CRM for notification. See Telephone encounter for: 07/19/18.  Pt says that her mail order stated that they need to speak with office about pt's medication LORazepam (ATIVAN) 2 MG tablet. Pt says that phone number that she was provided to give is [phone]  >> Jul 25, 2018  2:33 PM Waylan Rocher, [place] L wrote: Patient calling back because [facility] had not received a call yet from [name] about Lorazepam. Patient would like a call back if he will not be continuing this script for anxiety. [id number]#[id number]

## 2018-07-26 NOTE — Telephone Encounter (Signed)
They needed new RX. RX given verbally

## 2018-07-28 ENCOUNTER — Telehealth: Payer: Self-pay | Admitting: Internal Medicine

## 2018-07-28 NOTE — Telephone Encounter (Signed)
Okay given to Pharmacy

## 2018-07-28 NOTE — Telephone Encounter (Signed)
Copied from [place] [phone]. Topic: Quick Communication - Rx Refill/Question >> Jul 28, 2018 10:02 AM Oliver Pila B wrote: Medication: LORazepam (ATIVAN) 2 MG tablet [[id number]]   [facility] called to check if pt should continue taking the medication while taking oxycodone at the same time; contact pharmacy to advise  Call back: [phone] Ref# [id number]

## 2018-07-31 ENCOUNTER — Telehealth: Payer: Self-pay | Admitting: Internal Medicine

## 2018-07-31 MED FILL — oxyCODONE HCL 15 MG TABS: 15 | 30 days supply | Qty: 120 | Fill #0

## 2018-07-31 NOTE — Telephone Encounter (Signed)
Copied from [place] [phone]. Topic: Quick Communication - Rx Refill/Question >> Jul 28, 2018 10:02 AM Oliver Pila B wrote: Medication: LORazepam (ATIVAN) 2 MG tablet [[id number]]   [facility] called to check if pt should continue taking the medication while taking oxycodone at the same time; contact pharmacy to advise  Call back: [phone] Ref# [id number]

## 2018-07-31 NOTE — Telephone Encounter (Signed)
I inform of the phone notes already on this issues. Patient informed [facility] that the pharmacy is stating they still do not have anything for these medications. Can you please follow up with pharmacy. Thank you.

## 2018-07-31 NOTE — Telephone Encounter (Signed)
LMTCB, pharmacy does have this information, Ativan is in process with mail order.

## 2018-08-31 MED FILL — oxyCODONE HCL 15 MG TABS: 15 | 30 days supply | Qty: 120 | Fill #0

## 2018-09-01 DIAGNOSIS — N184 Chronic kidney disease, stage 4 (severe): Secondary | ICD-10-CM | POA: Diagnosis not present

## 2018-09-01 DIAGNOSIS — N189 Chronic kidney disease, unspecified: Secondary | ICD-10-CM | POA: Diagnosis not present

## 2018-09-24 ENCOUNTER — Other Ambulatory Visit: Payer: Self-pay | Admitting: Internal Medicine

## 2018-10-02 MED FILL — oxyCODONE HCL 15 MG TABS: 15 | 30 days supply | Qty: 120 | Fill #0

## 2018-10-05 ENCOUNTER — Other Ambulatory Visit: Payer: Self-pay | Admitting: Internal Medicine

## 2018-10-12 ENCOUNTER — Other Ambulatory Visit (INDEPENDENT_AMBULATORY_CARE_PROVIDER_SITE_OTHER): Payer: Medicare Other

## 2018-10-12 ENCOUNTER — Encounter: Payer: Self-pay | Admitting: Internal Medicine

## 2018-10-12 ENCOUNTER — Ambulatory Visit (INDEPENDENT_AMBULATORY_CARE_PROVIDER_SITE_OTHER): Payer: Medicare Other | Admitting: Internal Medicine

## 2018-10-12 VITALS — BP 126/82 | HR 76 | Temp 97.6°F | Ht 62.0 in | Wt 247.0 lb

## 2018-10-12 DIAGNOSIS — M545 Low back pain, unspecified: Secondary | ICD-10-CM

## 2018-10-12 DIAGNOSIS — I10 Essential (primary) hypertension: Secondary | ICD-10-CM | POA: Diagnosis not present

## 2018-10-12 DIAGNOSIS — E119 Type 2 diabetes mellitus without complications: Secondary | ICD-10-CM | POA: Diagnosis not present

## 2018-10-12 DIAGNOSIS — N184 Chronic kidney disease, stage 4 (severe): Secondary | ICD-10-CM | POA: Diagnosis not present

## 2018-10-12 DIAGNOSIS — Z23 Encounter for immunization: Secondary | ICD-10-CM

## 2018-10-12 DIAGNOSIS — I872 Venous insufficiency (chronic) (peripheral): Secondary | ICD-10-CM

## 2018-10-12 DIAGNOSIS — G8929 Other chronic pain: Secondary | ICD-10-CM

## 2018-10-12 DIAGNOSIS — K117 Disturbances of salivary secretion: Secondary | ICD-10-CM | POA: Diagnosis not present

## 2018-10-12 DIAGNOSIS — F411 Generalized anxiety disorder: Secondary | ICD-10-CM

## 2018-10-12 LAB — BASIC METABOLIC PANEL
BUN: 49 mg/dL — ABNORMAL HIGH (ref 6–23)
CO2: 30 mEq/L (ref 19–32)
Calcium: 10.1 mg/dL (ref 8.4–10.5)
Chloride: 96 mEq/L (ref 96–112)
Creatinine, Ser: 2.34 mg/dL — ABNORMAL HIGH (ref 0.40–1.20)
GFR: 22.06 mL/min — ABNORMAL LOW (ref >60.00)
Glucose, Bld: 164 mg/dL — ABNORMAL HIGH (ref 70–99)
Potassium: 4.6 mEq/L (ref 3.5–5.1)
Sodium: 134 mEq/L — ABNORMAL LOW (ref 135–145)

## 2018-10-12 LAB — HEMOGLOBIN A1C: Hgb A1c MFr Bld: 6.7 % — ABNORMAL HIGH (ref 4.6–6.5)

## 2018-10-12 MED ORDER — OXYCODONE HCL 15 MG PO TABS: 15.0000 mg | ORAL_TABLET | Freq: Four times a day (QID) | ORAL | 0 refills | Status: DC | PRN

## 2018-10-12 MED ORDER — LORAZEPAM 2 MG PO TABS: ORAL_TABLET | ORAL | 1 refills | Status: DC

## 2018-10-12 MED ORDER — NYSTATIN-TRIAMCINOLONE 100000-0.1 UNIT/GM-% EX OINT: 1.0000 "application " | TOPICAL_OINTMENT | Freq: Two times a day (BID) | CUTANEOUS | 1 refills | Status: DC

## 2018-10-12 MED FILL — NYSTATIN-TRIAMCINOLONE OINT: 100000-0.1 | 7 days supply | Qty: 30 | Fill #0

## 2018-10-12 NOTE — Assessment & Plan Note (Signed)
Long standing Glimepiride , Metformi

## 2018-10-12 NOTE — Assessment & Plan Note (Signed)
Labs and appt w/Dr Clover Mealy is coming up soon

## 2018-10-12 NOTE — Assessment & Plan Note (Signed)
Chronic opioid use.  Xtampza ER and Roxycodone.  Potential benefits of a long term opioids use as well as potential risks (i.e. addiction risk, apnea etc) and complications (i.e. Somnolence, constipation and others) were explained to the patient and were aknowledged. Oxycontin is not covered 3/18 Ginger Organ ER Not to use w/Lorazepam

## 2018-10-12 NOTE — Assessment & Plan Note (Signed)
Salagen

## 2018-10-12 NOTE — Assessment & Plan Note (Signed)
Lorazepam prn  Potential benefits of a long term benzodiazepines  use as well as potential risks  and complications were explained to the patient and were aknowledged. Not to take w/Oxycodone

## 2018-10-12 NOTE — Progress Notes (Signed)
Subjective:  Patient ID: [name], female    DOB: 05-Oct-1951  Age: 67 y.o. MRN: [id number]  CC: No chief complaint on file.   HPI [name] presents for chronic pain, LBP/knee pain, CRF, DM, HTN f/u  Outpatient Medications Prior to Visit  Medication Sig Dispense Refill  . amoxicillin (AMOXIL) 500 MG capsule TAKE 4 CAPSULES BY MOUTH 1  HOUR PRIOR TO DENTAL  PROCEDURE/CLEANING 16 capsule 1  . blood glucose meter kit and supplies Dispense based on patient and insurance preference. Used to check blood sugar daily, DX: E11.9 OneTouch 1 each 0  . buPROPion (WELLBUTRIN XL) 150 MG 24 hr tablet Take 2 tablets (300 mg total) by mouth daily. 180 tablet 3  . carvedilol (COREG) 25 MG tablet TAKE 1/2 TABLET BY MOUTH 2 TIMES DAILY WITH A MEAL. 90 tablet 3  . cyclobenzaprine (FLEXERIL) 10 MG tablet TAKE 3 TABLETS BY MOUTH AT  BEDTIME 270 tablet 0  . DULoxetine (CYMBALTA) 60 MG capsule Take 1 capsule (60 mg total) by mouth at bedtime. 90 capsule 3  . furosemide (LASIX) 40 MG tablet Take 1-2 tablets (40-80 mg total) by mouth daily. 180 tablet 3  . glimepiride (AMARYL) 1 MG tablet Take 1 mg by mouth daily with breakfast.    . glucose blood (TRUETEST TEST) test strip Used to check blood sugar daily, DX: E11.9 100 each 3  . Lancets (ONETOUCH ULTRASOFT) lancets Used to check blood sugar daily, DX: E11.9 100 each 3  . lidocaine (XYLOCAINE) 5 % ointment Apply topically 3 (three) times daily as needed. 100 g 3  . LORazepam (ATIVAN) 2 MG tablet TAKE 1/2 TO 1 TABLET BY  MOUTH EVERY 6 HOURS AS  NEEDED FOR ANXIETY 90 tablet 1  . Multiple Vitamin (MULTIVITAMIN WITH MINERALS) TABS tablet Take 1 tablet by mouth daily.    . Omega-3 300 MG CAPS Take 300 mg by mouth at bedtime.    Marland Kitchen oxyCODONE (ROXICODONE) 15 MG immediate release tablet Take 1 tablet (15 mg total) by mouth every 6 (six) hours as needed for pain. 120 tablet 0  . pantoprazole (PROTONIX) 40 MG tablet Take 1 tablet (40 mg total) by mouth daily. 90  tablet 3  . pilocarpine (SALAGEN) 5 MG tablet TAKE 1 TABLET BY MOUTH TWO  TIMES DAILY 180 tablet 3  . pioglitazone (ACTOS) 15 MG tablet Take 1 tablet (15 mg total) by mouth daily. 90 tablet 3  . polyvinyl alcohol (LIQUIFILM TEARS) 1.4 % ophthalmic solution Place 1 drop into both eyes daily as needed for dry eyes.    . PSYLLIUM PO Take 1 capsule by mouth 2 (two) times daily.    . VESICARE 5 MG tablet TAKE 1 TABLET BY MOUTH  DAILY 90 tablet 3  . cyclobenzaprine (FLEXERIL) 10 MG tablet TAKE 3 TABLETS BY MOUTH AT  BEDTIME 270 tablet 1  . Vitamin D, Cholecalciferol, 1000 UNITS TABS Take 1,000 Units by mouth daily.     No facility-administered medications prior to visit.     ROS: Review of Systems  Constitutional: Positive for fatigue. Negative for activity change, appetite change, chills and unexpected weight change.  HENT: Negative for congestion, mouth sores and sinus pressure.   Eyes: Negative for visual disturbance.  Respiratory: Negative for cough and chest tightness.   Gastrointestinal: Negative for abdominal pain and nausea.  Genitourinary: Negative for difficulty urinating, frequency and vaginal pain.  Musculoskeletal: Positive for arthralgias, back pain and gait problem.  Skin: Negative for pallor and  rash.  Neurological: Negative for dizziness, tremors, weakness, numbness and headaches.  Psychiatric/Behavioral: Positive for dysphoric mood. Negative for confusion, sleep disturbance and suicidal ideas. The patient is nervous/anxious.     Objective:  BP 126/82 (BP Location: Left Arm, Patient Position: Sitting, Cuff Size: Large)   Pulse 76   Temp 97.6 F (36.4 C) (Oral)   Ht '5\' 2"'$  (1.575 m)   Wt 247 lb (112 kg)   SpO2 96%   BMI 45.18 kg/m   BP Readings from Last 3 Encounters:  10/12/18 126/82  07/11/18 124/72  04/24/18 128/76    Wt Readings from Last 3 Encounters:  10/12/18 247 lb (112 kg)  07/11/18 243 lb (110.2 kg)  04/24/18 254 lb (115.2 kg)    Physical Exam    Constitutional: She appears well-developed. No distress.  HENT:  Head: Normocephalic.  Right Ear: External ear normal.  Left Ear: External ear normal.  Nose: Nose normal.  Mouth/Throat: Oropharynx is clear and moist.  Eyes: Pupils are equal, round, and reactive to light. Conjunctivae are normal. Right eye exhibits no discharge. Left eye exhibits no discharge.  Neck: Normal range of motion. Neck supple. No JVD present. No tracheal deviation present. No thyromegaly present.  Cardiovascular: Normal rate, regular rhythm and normal heart sounds.  Pulmonary/Chest: No stridor. No respiratory distress. She has no wheezes.  Abdominal: Soft. Bowel sounds are normal. She exhibits no distension and no mass. There is tenderness. There is no rebound and no guarding.  Musculoskeletal: She exhibits tenderness. She exhibits no edema.  Lymphadenopathy:    She has no cervical adenopathy.  Neurological: She displays normal reflexes. No cranial nerve deficit. She exhibits normal muscle tone. Coordination abnormal.  Skin: No rash noted. No erythema.  Psychiatric: Her behavior is normal. Judgment and thought content normal.  obese LS, knees - tender  Lab Results  Component Value Date   WBC 8.9 10/13/2017   HGB 10.2 (L) 10/13/2017   HCT 31.6 (L) 10/13/2017   PLT 239.0 10/13/2017   GLUCOSE 180 (H) 07/11/2018   CHOL 215 (H) 09/25/2014   TRIG 154.0 (H) 09/25/2014   HDL 46.00 09/25/2014   LDLCALC 138 (H) 09/25/2014   ALT 10 08/20/2016   AST 12 08/20/2016   NA 135 07/11/2018   K 4.5 07/11/2018   CL 98 07/11/2018   CREATININE 2.09 (H) 07/11/2018   BUN 42 (H) 07/11/2018   CO2 29 07/11/2018   TSH 1.64 07/25/2013   INR 1.04 02/26/2015   HGBA1C 7.1 (H) 07/11/2018    Dg Ribs Unilateral Left  Result Date: 04/24/2018 CLINICAL DATA:  Fall with chest wall pain EXAM: LEFT RIBS - 2 VIEW COMPARISON:  Chest x-ray 03/01/2016 FINDINGS: No pneumothorax or pleural effusion. Atelectasis at the left lung base. Acute  displaced left fourth, fifth, sixth, and eighth rib fractures. IMPRESSION: 1. Acute displaced left fourth, fifth, sixth and eighth rib fractures. 2. No definitive left pneumothorax or pleural effusion seen. These results will be called to the ordering clinician or representative by the Radiologist Assistant, and communication documented in the PACS or zVision Dashboard. Electronically Signed   By: Donavan Foil M.D.   On: 04/24/2018 20:59    Assessment & Plan:   There are no diagnoses linked to this encounter.   No orders of the defined types were placed in this encounter.    Follow-up: No follow-ups on file.  [name], MD

## 2018-10-12 NOTE — Assessment & Plan Note (Signed)
Coreg

## 2018-10-12 NOTE — Addendum Note (Signed)
Addended by: Karren Cobble on: 10/12/2018 04:03 PM   Modules accepted: Orders

## 2018-10-12 NOTE — Assessment & Plan Note (Signed)
Compression full thigh hose for travel

## 2018-10-31 ENCOUNTER — Other Ambulatory Visit: Payer: Self-pay | Admitting: Internal Medicine

## 2018-10-31 DIAGNOSIS — D631 Anemia in chronic kidney disease: Secondary | ICD-10-CM | POA: Diagnosis not present

## 2018-10-31 DIAGNOSIS — N2581 Secondary hyperparathyroidism of renal origin: Secondary | ICD-10-CM | POA: Diagnosis not present

## 2018-10-31 DIAGNOSIS — E1129 Type 2 diabetes mellitus with other diabetic kidney complication: Secondary | ICD-10-CM | POA: Diagnosis not present

## 2018-10-31 DIAGNOSIS — N189 Chronic kidney disease, unspecified: Secondary | ICD-10-CM | POA: Diagnosis not present

## 2018-10-31 DIAGNOSIS — I10 Essential (primary) hypertension: Secondary | ICD-10-CM | POA: Diagnosis not present

## 2018-10-31 MED FILL — oxyCODONE HCL 15 MG TABS: 15 | 30 days supply | Qty: 120 | Fill #0

## 2018-10-31 NOTE — Telephone Encounter (Signed)
Copied from [place] [phone]. Topic: Quick Communication - Rx Refill/Question >> Oct 31, 2018  8:52 AM Antonieta Iba C wrote: Medication: pioglitazone (ACTOS) 15 MG tablet  Has the patient contacted their pharmacy? Yes   (Agent: If no, request that the patient contact the pharmacy for the refill.) (Agent: If yes, when and what did the pharmacy advise?)  Preferred Pharmacy (with phone number or street name): [place], [place] - [address] [phone] (Phone) [phone] (Fax)    Agent: Please be advised that RX refills may take up to 3 business days. We ask that you follow-up with your pharmacy.

## 2018-11-01 MED ORDER — PIOGLITAZONE HCL 15 MG PO TABS: 15.0000 mg | ORAL_TABLET | Freq: Every day | ORAL | 0 refills | Status: DC

## 2018-11-20 DIAGNOSIS — L304 Erythema intertrigo: Secondary | ICD-10-CM | POA: Diagnosis not present

## 2018-11-20 MED FILL — FLUCONAZOLE 150 MG TABS: 150 | 28 days supply | Qty: 4 | Fill #0

## 2018-11-20 MED FILL — KETOCONAZOLE 2% CREAM: 2 | 30 days supply | Qty: 60 | Fill #0

## 2018-11-20 MED FILL — HYDROCORTISONE 2.5% CREAM: 2.5 | 30 days supply | Qty: 90 | Fill #0

## 2018-11-24 ENCOUNTER — Telehealth: Payer: Self-pay | Admitting: Internal Medicine

## 2018-11-27 ENCOUNTER — Telehealth: Payer: Self-pay | Admitting: Internal Medicine

## 2018-11-27 MED ORDER — PILOCARPINE HCL 5 MG PO TABS: 5.0000 mg | ORAL_TABLET | Freq: Two times a day (BID) | ORAL | 3 refills | Status: DC

## 2018-11-27 MED FILL — PILOCARPINE HCL 5 MG TABLET: 5 | 90 days supply | Qty: 180 | Fill #0

## 2018-11-27 NOTE — Telephone Encounter (Signed)
RX sent

## 2018-11-27 NOTE — Telephone Encounter (Signed)
Copied from [place] [phone]. Topic: Quick Communication - Rx Refill/Question >> Nov 27, 2018 10:48 AM Conception Chancy, NT wrote: Patient states her mail order pharmacy can not get pilocarpine (SALAGEN) 5 MG tablet. They suggested that her pcp send it to a local pharmacy. She would like it sent to the pharmacy below.  [place], [place] - [address] [facility]. [address] [place] [address] Phone: [phone] Fax: [phone]

## 2018-11-30 MED FILL — LORazepam 2 MG TABS: 2 | 23 days supply | Qty: 90 | Fill #0

## 2018-11-30 MED FILL — oxyCODONE HCL 15 MG TABS: 15 | 30 days supply | Qty: 120 | Fill #0

## 2018-12-05 NOTE — Telephone Encounter (Signed)
Ben, with [facility], calling because the patient is taking flexeril, oxycodone, and lorazepam and needs clarification on on whether [name] still wants the patient to take Flexeril. Please call back to clarify and use [id number]

## 2018-12-08 NOTE — Telephone Encounter (Signed)
info given to [facility]

## 2019-01-01 DIAGNOSIS — L821 Other seborrheic keratosis: Secondary | ICD-10-CM | POA: Diagnosis not present

## 2019-01-01 DIAGNOSIS — L814 Other melanin hyperpigmentation: Secondary | ICD-10-CM | POA: Diagnosis not present

## 2019-01-01 DIAGNOSIS — D225 Melanocytic nevi of trunk: Secondary | ICD-10-CM | POA: Diagnosis not present

## 2019-01-01 DIAGNOSIS — L570 Actinic keratosis: Secondary | ICD-10-CM | POA: Diagnosis not present

## 2019-01-01 DIAGNOSIS — D1801 Hemangioma of skin and subcutaneous tissue: Secondary | ICD-10-CM | POA: Diagnosis not present

## 2019-01-01 MED FILL — oxyCODONE HCL 15 MG TABS: 15 | 30 days supply | Qty: 120 | Fill #0

## 2019-01-16 ENCOUNTER — Other Ambulatory Visit (INDEPENDENT_AMBULATORY_CARE_PROVIDER_SITE_OTHER): Payer: Medicare Other

## 2019-01-16 ENCOUNTER — Ambulatory Visit (INDEPENDENT_AMBULATORY_CARE_PROVIDER_SITE_OTHER): Payer: Medicare Other | Admitting: Internal Medicine

## 2019-01-16 ENCOUNTER — Encounter: Payer: Self-pay | Admitting: Internal Medicine

## 2019-01-16 VITALS — BP 124/76 | HR 63 | Temp 97.8°F | Ht 62.0 in | Wt 254.0 lb

## 2019-01-16 DIAGNOSIS — E119 Type 2 diabetes mellitus without complications: Secondary | ICD-10-CM

## 2019-01-16 DIAGNOSIS — N184 Chronic kidney disease, stage 4 (severe): Secondary | ICD-10-CM | POA: Diagnosis not present

## 2019-01-16 DIAGNOSIS — I10 Essential (primary) hypertension: Secondary | ICD-10-CM | POA: Diagnosis not present

## 2019-01-16 DIAGNOSIS — Z23 Encounter for immunization: Secondary | ICD-10-CM

## 2019-01-16 DIAGNOSIS — G8929 Other chronic pain: Secondary | ICD-10-CM

## 2019-01-16 DIAGNOSIS — M545 Low back pain, unspecified: Secondary | ICD-10-CM

## 2019-01-16 LAB — BASIC METABOLIC PANEL
BUN: 46 mg/dL — ABNORMAL HIGH (ref 6–23)
CO2: 30 mEq/L (ref 19–32)
Calcium: 10 mg/dL (ref 8.4–10.5)
Chloride: 96 mEq/L (ref 96–112)
Creatinine, Ser: 2.22 mg/dL — ABNORMAL HIGH (ref 0.40–1.20)
GFR: 22.04 mL/min — ABNORMAL LOW (ref >60.00)
Glucose, Bld: 92 mg/dL (ref 70–99)
Potassium: 4.4 mEq/L (ref 3.5–5.1)
Sodium: 134 mEq/L — ABNORMAL LOW (ref 135–145)

## 2019-01-16 LAB — HEMOGLOBIN A1C: Hgb A1c MFr Bld: 6.7 % — ABNORMAL HIGH (ref 4.6–6.5)

## 2019-01-16 MED ORDER — OXYCODONE HCL 15 MG PO TABS: 15.0000 mg | ORAL_TABLET | Freq: Four times a day (QID) | ORAL | 0 refills | Status: DC | PRN

## 2019-01-16 MED ORDER — OXYCODONE HCL 15 MG PO TABS: 15.0000 mg | ORAL_TABLET | Freq: Four times a day (QID) | ORAL | 0 refills | Status: DC

## 2019-01-16 MED ORDER — LIDOCAINE 5 % EX OINT: TOPICAL_OINTMENT | Freq: Three times a day (TID) | CUTANEOUS | 3 refills | Status: DC | PRN

## 2019-01-16 MED ORDER — CARVEDILOL 25 MG PO TABS: ORAL_TABLET | ORAL | 3 refills | Status: DC

## 2019-01-16 NOTE — Assessment & Plan Note (Addendum)
Glimepiride , Metformin, Actos  cardiac CT scan for calcium scoring offered

## 2019-01-16 NOTE — Assessment & Plan Note (Signed)
Roxycodone 15 mg qid  Potential benefits of a long term opioids use as well as potential risks (i.e. addiction risk, apnea etc) and complications (i.e. Somnolence, constipation and others) were explained to the patient and were aknowledged.

## 2019-01-16 NOTE — Assessment & Plan Note (Signed)
cardiac CT scan for calcium scoring 

## 2019-01-16 NOTE — Patient Instructions (Signed)
Cardiac CT calcium scoring test $150   Computed tomography, more commonly known as a CT or CAT scan, is a diagnostic medical imaging test. Like traditional x-rays, it produces multiple images or pictures of the inside of the body. The cross-sectional images generated during a CT scan can be reformatted in multiple planes. They can even generate three-dimensional images. These images can be viewed on a computer monitor, printed on film or by a 3D printer, or transferred to a CD or DVD. CT images of internal organs, bones, soft tissue and blood vessels provide greater detail than traditional x-rays, particularly of soft tissues and blood vessels. A cardiac CT scan for coronary calcium is a non-invasive way of obtaining information about the presence, location and extent of calcified plaque in the coronary arteries-the vessels that supply oxygen-containing blood to the heart muscle. Calcified plaque results when there is a build-up of fat and other substances under the inner layer of the artery. This material can calcify which signals the presence of atherosclerosis, a disease of the vessel wall, also called coronary artery disease (CAD). People with this disease have an increased risk for heart attacks. In addition, over time, progression of plaque build up (CAD) can narrow the arteries or even close off blood flow to the heart. The result may be chest pain, sometimes called "angina," or a heart attack. Because calcium is a marker of CAD, the amount of calcium detected on a cardiac CT scan is a helpful prognostic tool. The findings on cardiac CT are expressed as a calcium score. Another name for this test is coronary artery calcium scoring.  What are some common uses of the procedure? The goal of cardiac CT scan for calcium scoring is to determine if CAD is present and to what extent, even if there are no symptoms. It is a screening study that may be recommended by a physician for patients with risk factors  for CAD but no clinical symptoms. The major risk factors for CAD are: . high blood cholesterol levels  . family history of heart attacks  . diabetes  . high blood pressure  . cigarette smoking  . overweight or obese  . physical inactivity   A negative cardiac CT scan for calcium scoring shows no calcification within the coronary arteries. This suggests that CAD is absent or so minimal it cannot be seen by this technique. The chance of having a heart attack over the next two to five years is very low under these circumstances. A positive test means that CAD is present, regardless of whether or not the patient is experiencing any symptoms. The amount of calcification-expressed as the calcium score-may help to predict the likelihood of a myocardial infarction (heart attack) in the coming years and helps your medical doctor or cardiologist decide whether the patient may need to take preventive medicine or undertake other measures such as diet and exercise to lower the risk for heart attack. The extent of CAD is graded according to your calcium score:  Calcium Score  Presence of CAD  0 No evidence of CAD   1-10 Minimal evidence of CAD  11-100 Mild evidence of CAD  101-400 Moderate evidence of CAD  Over 400 Extensive evidence of CAD    

## 2019-01-16 NOTE — Progress Notes (Signed)
Subjective:  Patient ID: [name], female    DOB: Apr 07, 1951  Age: 68 y.o. MRN: [id number]  CC: No chief complaint on file.   HPI [name] presents for DM, HTN, knee pain  Outpatient Medications Prior to Visit  Medication Sig Dispense Refill  . amoxicillin (AMOXIL) 500 MG capsule TAKE 4 CAPSULES BY MOUTH 1  HOUR PRIOR TO DENTAL  PROCEDURE/CLEANING 16 capsule 1  . blood glucose meter kit and supplies Dispense based on patient and insurance preference. Used to check blood sugar daily, DX: E11.9 OneTouch 1 each 0  . buPROPion (WELLBUTRIN XL) 300 MG 24 hr tablet TAKE 1 TABLET BY MOUTH  DAILY 90 tablet 3  . cyclobenzaprine (FLEXERIL) 10 MG tablet TAKE 3 TABLETS BY MOUTH AT  BEDTIME 270 tablet 0  . DULoxetine (CYMBALTA) 60 MG capsule TAKE 1 CAPSULE BY MOUTH AT  BEDTIME 90 capsule 3  . furosemide (LASIX) 40 MG tablet Take 1-2 tablets (40-80 mg total) by mouth daily. 180 tablet 3  . glimepiride (AMARYL) 1 MG tablet TAKE 1 TABLET BY MOUTH  DAILY BEFORE BREAKFAST 90 tablet 3  . glucose blood (TRUETEST TEST) test strip Used to check blood sugar daily, DX: E11.9 100 each 3  . Lancets (ONETOUCH ULTRASOFT) lancets Used to check blood sugar daily, DX: E11.9 100 each 3  . LORazepam (ATIVAN) 2 MG tablet TAKE 1/2 TO 1 TABLET BY  MOUTH EVERY 6 HOURS AS  NEEDED FOR ANXIETY 90 tablet 3  . Multiple Vitamin (MULTIVITAMIN WITH MINERALS) TABS tablet Take 1 tablet by mouth daily.    Marland Kitchen nystatin-triamcinolone ointment (MYCOLOG) Apply 1 application topically 2 (two) times daily. 30 g 1  . Omega-3 300 MG CAPS Take 300 mg by mouth at bedtime.    . pantoprazole (PROTONIX) 40 MG tablet TAKE 1 TABLET BY MOUTH  DAILY 90 tablet 3  . pilocarpine (SALAGEN) 5 MG tablet Take 1 tablet (5 mg total) by mouth 2 (two) times daily. 180 tablet 3  . pioglitazone (ACTOS) 15 MG tablet TAKE 1 TABLET BY MOUTH  DAILY 90 tablet 0  . polyvinyl alcohol (LIQUIFILM TEARS) 1.4 % ophthalmic solution Place 1 drop into both eyes daily as  needed for dry eyes.    . PSYLLIUM PO Take 1 capsule by mouth 2 (two) times daily.    . VESICARE 5 MG tablet TAKE 1 TABLET BY MOUTH  DAILY 90 tablet 3  . carvedilol (COREG) 25 MG tablet TAKE 1/2 TABLET BY MOUTH 2 TIMES DAILY WITH A MEAL. 90 tablet 3  . lidocaine (XYLOCAINE) 5 % ointment Apply topically 3 (three) times daily as needed. 100 g 3  . oxyCODONE (ROXICODONE) 15 MG immediate release tablet Take 1 tablet (15 mg total) by mouth every 6 (six) hours as needed for pain. 120 tablet 0  . buPROPion (WELLBUTRIN XL) 150 MG 24 hr tablet Take 2 tablets (300 mg total) by mouth daily. (Patient not taking: Reported on 01/16/2019) 180 tablet 3   No facility-administered medications prior to visit.     ROS: Review of Systems  Constitutional: Positive for fatigue. Negative for activity change, appetite change, chills and unexpected weight change.  HENT: Negative for congestion, mouth sores and sinus pressure.   Eyes: Negative for visual disturbance.  Respiratory: Negative for cough and chest tightness.   Gastrointestinal: Negative for abdominal pain and nausea.  Genitourinary: Negative for difficulty urinating, frequency and vaginal pain.  Musculoskeletal: Positive for arthralgias, back pain and gait problem.  Skin: Negative  for pallor and rash.  Neurological: Negative for dizziness, tremors, weakness, numbness and headaches.  Psychiatric/Behavioral: Positive for dysphoric mood. Negative for confusion, sleep disturbance and suicidal ideas.    Objective:  BP 124/76 (BP Location: Left Arm, Patient Position: Sitting, Cuff Size: Large)   Pulse 63   Temp 97.8 F (36.6 C) (Oral)   Ht _0  (1.575 m)   Wt 254 lb (115.2 kg)   SpO2 97%   BMI 46.46 kg/m   BP Readings from Last 3 Encounters:  01/16/19 124/76  10/12/18 126/82  07/11/18 124/72    Wt Readings from Last 3 Encounters:  01/16/19 254 lb (115.2 kg)  10/12/18 247 lb (112 kg)  07/11/18 243 lb (110.2 kg)    Physical  Exam Constitutional:      General: She is not in acute distress.    Appearance: She is well-developed.  HENT:     Head: Normocephalic.     Right Ear: External ear normal.     Left Ear: External ear normal.     Nose: Nose normal.  Eyes:     General:        Right eye: No discharge.        Left eye: No discharge.     Conjunctiva/sclera: Conjunctivae normal.     Pupils: Pupils are equal, round, and reactive to light.  Neck:     Musculoskeletal: Normal range of motion and neck supple.     Thyroid: No thyromegaly.     Vascular: No JVD.     Trachea: No tracheal deviation.  Cardiovascular:     Rate and Rhythm: Normal rate and regular rhythm.     Heart sounds: Normal heart sounds.  Pulmonary:     Effort: No respiratory distress.     Breath sounds: No stridor. No wheezing.  Abdominal:     General: Bowel sounds are normal. There is no distension.     Palpations: Abdomen is soft. There is no mass.     Tenderness: There is no abdominal tenderness. There is no guarding or rebound.  Musculoskeletal:        General: Tenderness present.  Lymphadenopathy:     Cervical: No cervical adenopathy.  Skin:    Findings: No erythema or rash.  Neurological:     Cranial Nerves: No cranial nerve deficit.     Motor: No abnormal muscle tone.     Coordination: Coordination normal.     Gait: Gait abnormal.     Deep Tendon Reflexes: Reflexes normal.  Psychiatric:        Behavior: Behavior normal.        Thought Content: Thought content normal.        Judgment: Judgment normal.   LS, knees - painful  Lab Results  Component Value Date   WBC 8.9 10/13/2017   HGB 10.2 (L) 10/13/2017   HCT 31.6 (L) 10/13/2017   PLT 239.0 10/13/2017   GLUCOSE 164 (H) 10/12/2018   CHOL 215 (H) 09/25/2014   TRIG 154.0 (H) 09/25/2014   HDL 46.00 09/25/2014   LDLCALC 138 (H) 09/25/2014   ALT 10 08/20/2016   AST 12 08/20/2016   NA 134 (L) 10/12/2018   K 4.6 10/12/2018   CL 96 10/12/2018   CREATININE 2.34 (H)  10/12/2018   BUN 49 (H) 10/12/2018   CO2 30 10/12/2018   TSH 1.64 07/25/2013   INR 1.04 02/26/2015   HGBA1C 6.7 (H) 10/12/2018    Dg Ribs Unilateral Left  Result Date: 04/24/2018 CLINICAL  DATA:  Fall with chest wall pain EXAM: LEFT RIBS - 2 VIEW COMPARISON:  Chest x-ray 03/01/2016 FINDINGS: No pneumothorax or pleural effusion. Atelectasis at the left lung base. Acute displaced left fourth, fifth, sixth, and eighth rib fractures. IMPRESSION: 1. Acute displaced left fourth, fifth, sixth and eighth rib fractures. 2. No definitive left pneumothorax or pleural effusion seen. These results will be called to the ordering clinician or representative by the Radiologist Assistant, and communication documented in the PACS or zVision Dashboard. Electronically Signed   By: Donavan Foil M.D.   On: 04/24/2018 20:59    Assessment & Plan:   Diagnoses and all orders for this visit:  Chronic bilateral low back pain without sciatica  Controlled type 2 diabetes mellitus without complication, without long-term current use of insulin ([name])  Stage 4 chronic kidney disease ([place])  Essential hypertension  Other orders -     carvedilol (COREG) 25 MG tablet; TAKE 1/2 TABLET BY MOUTH 2 TIMES DAILY WITH A MEAL. -     lidocaine (XYLOCAINE) 5 % ointment; Apply topically 3 (three) times daily as needed. -     oxyCODONE (ROXICODONE) 15 MG immediate release tablet; Take 1 tablet (15 mg total) by mouth every 6 (six) hours as needed for pain. -     oxyCODONE (ROXICODONE) 15 MG immediate release tablet; Take 1 tablet (15 mg total) by mouth 4 (four) times daily. -     oxyCODONE (ROXICODONE) 15 MG immediate release tablet; Take 1 tablet (15 mg total) by mouth 4 (four) times daily.     Meds ordered this encounter  Medications  . carvedilol (COREG) 25 MG tablet    Sig: TAKE 1/2 TABLET BY MOUTH 2 TIMES DAILY WITH A MEAL.    Dispense:  90 tablet    Refill:  3  . lidocaine (XYLOCAINE) 5 % ointment    Sig: Apply  topically 3 (three) times daily as needed.    Dispense:  100 g    Refill:  3  . oxyCODONE (ROXICODONE) 15 MG immediate release tablet    Sig: Take 1 tablet (15 mg total) by mouth every 6 (six) hours as needed for pain.    Dispense:  120 tablet    Refill:  0    Please fill on or after 01/23/19  . oxyCODONE (ROXICODONE) 15 MG immediate release tablet    Sig: Take 1 tablet (15 mg total) by mouth 4 (four) times daily.    Dispense:  120 tablet    Refill:  0    Please fill on or after 02/22/19  . oxyCODONE (ROXICODONE) 15 MG immediate release tablet    Sig: Take 1 tablet (15 mg total) by mouth 4 (four) times daily.    Dispense:  120 tablet    Refill:  0    Please fill on or after 03/25/19     Follow-up: Return in about 3 months (around 04/16/2019) for a follow-up visit.  [name], MD

## 2019-01-16 NOTE — Assessment & Plan Note (Signed)
F/u w/dr Clover Mealy

## 2019-01-19 ENCOUNTER — Telehealth: Payer: Self-pay

## 2019-01-19 NOTE — Telephone Encounter (Signed)
PA approved through 12/13/19 

## 2019-01-24 DIAGNOSIS — M778 Other enthesopathies, not elsewhere classified: Secondary | ICD-10-CM | POA: Insufficient documentation

## 2019-01-24 DIAGNOSIS — M25532 Pain in left wrist: Secondary | ICD-10-CM | POA: Diagnosis not present

## 2019-01-24 DIAGNOSIS — M67834 Other specified disorders of tendon, left wrist: Secondary | ICD-10-CM | POA: Diagnosis not present

## 2019-01-24 MED FILL — predniSONE 10 MG TABS: 10 | 6 days supply | Qty: 21 | Fill #0

## 2019-02-01 MED FILL — oxyCODONE HCL 15 MG TABS: 15 | 30 days supply | Qty: 120 | Fill #0

## 2019-02-13 ENCOUNTER — Telehealth: Payer: Self-pay | Admitting: Internal Medicine

## 2019-02-13 NOTE — Telephone Encounter (Signed)
Copied from [name] [id number]. Topic: Quick Communication - See Telephone Encounter >> Feb 13, 2019  3:46 PM Rutherford Nail, NT wrote: CRM for notification. See Telephone encounter for: 02/13/19. Patient calling and would like to know if Dr Alain Marion would consider increasing her pain medication? States that she has been dealing with pain in her right buttocks that shoots down the back side of her right thigh for the last few weeks. States that she has been walking around a little more and today she dropped a pack of kleenex and could hardly bend over to pick that up. Would like to know if a tens unit would be beneficial for her? Please advise.  CB#:[phone]

## 2019-02-14 NOTE — Telephone Encounter (Signed)
Please advise 

## 2019-02-15 NOTE — Telephone Encounter (Signed)
I am sorry about the pain.  Increasing pain medicines would be beyond my comfort level.  I will be happy to refer [name] to see a pain specialist/pain clinic. Thank you

## 2019-02-19 ENCOUNTER — Telehealth: Payer: Self-pay | Admitting: Internal Medicine

## 2019-02-19 ENCOUNTER — Other Ambulatory Visit: Payer: Self-pay | Admitting: Internal Medicine

## 2019-02-19 MED ORDER — VESICARE 5 MG PO TABS: 5.0000 mg | ORAL_TABLET | Freq: Every day | ORAL | 0 refills | Status: DC

## 2019-02-19 MED ORDER — PIOGLITAZONE HCL 15 MG PO TABS: 15.0000 mg | ORAL_TABLET | Freq: Every day | ORAL | 0 refills | Status: DC

## 2019-02-19 MED FILL — PIOGLITAZONE HCL 15 MG TAB: 15 | 90 days supply | Qty: 90 | Fill #0

## 2019-02-19 NOTE — Telephone Encounter (Signed)
Copied from [place] [phone]. Topic: Quick Communication - Rx Refill/Question >> Feb 19, 2019 10:06 AM Yvette Rack wrote: Medication: VESICARE 5 MG tablet  Has the patient contacted their pharmacy? yes  Preferred Pharmacy (with phone number or street name): [place], [place] [phone] (Phone)  [phone] (Fax)  Agent: Please be advised that RX refills may take up to 3 business days. We ask that you follow-up with your pharmacy.

## 2019-02-19 NOTE — Telephone Encounter (Signed)
Requested Prescriptions  Pending Prescriptions Disp Refills  . pioglitazone (ACTOS) 15 MG tablet 90 tablet 0    Sig: Take 1 tablet (15 mg total) by mouth daily.     Endocrinology:  Diabetes - Glitazones - pioglitazone Passed - 02/19/2019 11:32 AM      Passed - HBA1C is between 0 and 7.9 and within 180 days    Hgb A1c MFr Bld  Date Value Ref Range Status  01/16/2019 6.7 (H) 4.6 - 6.5 % Final    Comment:    Glycemic Control Guidelines for People with Diabetes:Non Diabetic:  <6%Goal of Therapy: <7%Additional Action Suggested:  >8%          Passed - Valid encounter within last 6 months    Recent Outpatient Visits          1 month ago Chronic bilateral low back pain without sciatica   [place], MD   4 months ago Need for influenza vaccination   [place] Plotnikov, Evie Lacks, MD   7 months ago Stage 4 chronic kidney disease [facility])   [place], MD   10 months ago Chest wall contusion, left, initial encounter   [place], Evie Lacks, MD   11 months ago Essential hypertension   [place], Evie Lacks, MD      Future Appointments            In 1 month Plotnikov, Evie Lacks, MD [place], [place]

## 2019-02-19 NOTE — Telephone Encounter (Signed)
Copied from [place] [id number]. Topic: Quick Communication - Rx Refill/Question >> Feb 19, 2019 10:10 AM Yvette Rack wrote: Medication: pioglitazone (ACTOS) 15 MG tablet  Has the patient contacted their pharmacy? yes   Preferred Pharmacy (with phone number or street name): [place], [place]. [phone] (Phone)  [phone] (Fax)  Agent: Please be advised that RX refills may take up to 3 business days. We ask that you follow-up with your pharmacy.

## 2019-02-19 NOTE — Telephone Encounter (Signed)
Requested Prescriptions  Pending Prescriptions Disp Refills  . VESICARE 5 MG tablet 90 tablet 0    Sig: Take 1 tablet (5 mg total) by mouth daily.     Urology:  Bladder Agents Passed - 02/19/2019 11:22 AM      Passed - Valid encounter within last 12 months    Recent Outpatient Visits          1 month ago Chronic bilateral low back pain without sciatica   [place], MD   4 months ago Need for influenza vaccination   [place] Plotnikov, Evie Lacks, MD   7 months ago Stage 4 chronic kidney disease [facility])   [facility] Plotnikov, Evie Lacks, MD   10 months ago Chest wall contusion, left, initial encounter   [place], Evie Lacks, MD   11 months ago Essential hypertension   [place], Evie Lacks, MD      Future Appointments            In 1 month Plotnikov, Evie Lacks, MD [place], [place]

## 2019-02-20 NOTE — Telephone Encounter (Signed)
Pt does not want referral.

## 2019-02-22 NOTE — Telephone Encounter (Signed)
Patient states [facility] has [facility] her yesterday and today stating they need a generic script for vesicare.  Please follow up with patient in regard.

## 2019-02-23 MED ORDER — SOLIFENACIN SUCCINATE 5 MG PO TABS: 5.0000 mg | ORAL_TABLET | Freq: Every day | ORAL | 1 refills | Status: DC

## 2019-02-23 NOTE — Telephone Encounter (Signed)
RX sent

## 2019-02-23 NOTE — Addendum Note (Signed)
Addended by: Karren Cobble on: 02/23/2019 04:35 PM   Modules accepted: Orders

## 2019-03-01 MED FILL — oxyCODONE HCL 15 MG TABS: 15 | 30 days supply | Qty: 120 | Fill #0

## 2019-03-21 DIAGNOSIS — M25811 Other specified joint disorders, right shoulder: Secondary | ICD-10-CM | POA: Diagnosis not present

## 2019-03-21 DIAGNOSIS — M25511 Pain in right shoulder: Secondary | ICD-10-CM | POA: Diagnosis not present

## 2019-04-02 ENCOUNTER — Other Ambulatory Visit: Payer: Self-pay | Admitting: Internal Medicine

## 2019-04-02 MED FILL — LORazepam 2 MG TABS: 2 | 23 days supply | Qty: 90 | Fill #0

## 2019-04-02 NOTE — Telephone Encounter (Signed)
[place] Controlled Database Checked Last filled: 03/01/19 # 120 LOV w/you: 01/16/19 Next appt w/you: 04/16/19

## 2019-04-02 NOTE — Telephone Encounter (Signed)
Patient was told that [name] could not fill this nor could the prescription be transferred because it is controlled. She needs this sent to [facility]

## 2019-04-03 ENCOUNTER — Other Ambulatory Visit: Payer: Self-pay | Admitting: Internal Medicine

## 2019-04-03 MED ORDER — OXYCODONE HCL 15 MG PO TABS: 15.0000 mg | ORAL_TABLET | Freq: Four times a day (QID) | ORAL | 0 refills | Status: DC

## 2019-04-03 MED FILL — oxyCODONE HCL 15 MG TABS: 15 | 30 days supply | Qty: 120 | Fill #0

## 2019-04-03 NOTE — Telephone Encounter (Signed)
Pt states her rx was sent to pharmacy as requested, however, this was to be her April  Rx and this one sent cannot be filled until 04/24/19 Pt states she ran out of her medication yesterday, and needs sent asap please.  The pharmacy called the pt and asked her to call the office to straighten out.  [place], [place] - [place] [phone] (Phone) [phone] (Fax)

## 2019-04-03 NOTE — Addendum Note (Signed)
Addended by: Karren Cobble on: 04/03/2019 01:32 PM   Modules accepted: Orders

## 2019-04-03 NOTE — Telephone Encounter (Signed)
Pharmacy called because the prescription sent stated to be filled in May but the patient needs medication now. The old prescription for April was sent to [facility] and can not be transferred to [facility] long. They need a prescription sent to them for APril and they will cancel the one at [facility]

## 2019-04-16 ENCOUNTER — Ambulatory Visit: Payer: Medicare Other | Admitting: Internal Medicine

## 2019-04-16 ENCOUNTER — Encounter: Payer: Self-pay | Admitting: Internal Medicine

## 2019-04-16 ENCOUNTER — Other Ambulatory Visit (INDEPENDENT_AMBULATORY_CARE_PROVIDER_SITE_OTHER): Payer: Medicare Other

## 2019-04-16 ENCOUNTER — Other Ambulatory Visit: Payer: Self-pay

## 2019-04-16 ENCOUNTER — Ambulatory Visit (INDEPENDENT_AMBULATORY_CARE_PROVIDER_SITE_OTHER): Payer: Medicare Other | Admitting: Internal Medicine

## 2019-04-16 VITALS — BP 126/82 | HR 76 | Temp 97.8°F | Ht 62.0 in | Wt 253.0 lb

## 2019-04-16 DIAGNOSIS — Z23 Encounter for immunization: Secondary | ICD-10-CM | POA: Diagnosis not present

## 2019-04-16 DIAGNOSIS — I10 Essential (primary) hypertension: Secondary | ICD-10-CM

## 2019-04-16 DIAGNOSIS — N184 Chronic kidney disease, stage 4 (severe): Secondary | ICD-10-CM | POA: Diagnosis not present

## 2019-04-16 DIAGNOSIS — E119 Type 2 diabetes mellitus without complications: Secondary | ICD-10-CM

## 2019-04-16 DIAGNOSIS — M545 Low back pain, unspecified: Secondary | ICD-10-CM

## 2019-04-16 DIAGNOSIS — L304 Erythema intertrigo: Secondary | ICD-10-CM

## 2019-04-16 DIAGNOSIS — G8929 Other chronic pain: Secondary | ICD-10-CM

## 2019-04-16 LAB — BASIC METABOLIC PANEL
BUN: 53 mg/dL — ABNORMAL HIGH (ref 6–23)
CO2: 27 mEq/L (ref 19–32)
Calcium: 9.3 mg/dL (ref 8.4–10.5)
Chloride: 99 mEq/L (ref 96–112)
Creatinine, Ser: 2.23 mg/dL — ABNORMAL HIGH (ref 0.40–1.20)
GFR: 21.91 mL/min — ABNORMAL LOW (ref >60.00)
Glucose, Bld: 158 mg/dL — ABNORMAL HIGH (ref 70–99)
Potassium: 4.2 mEq/L (ref 3.5–5.1)
Sodium: 137 mEq/L (ref 135–145)

## 2019-04-16 LAB — HEMOGLOBIN A1C: Hgb A1c MFr Bld: 6.8 % — ABNORMAL HIGH (ref 4.6–6.5)

## 2019-04-16 MED ORDER — OXYCODONE HCL 15 MG PO TABS: 15.0000 mg | ORAL_TABLET | Freq: Four times a day (QID) | ORAL | 0 refills | Status: DC

## 2019-04-16 MED ORDER — KETOCONAZOLE 2 % EX CREA: 1.0000 "application " | TOPICAL_CREAM | Freq: Two times a day (BID) | CUTANEOUS | 3 refills | Status: AC

## 2019-04-16 MED ORDER — KETOCONAZOLE 200 MG PO TABS: 100.0000 mg | ORAL_TABLET | Freq: Every day | ORAL | 2 refills | Status: DC

## 2019-04-16 MED ORDER — OXYCODONE HCL 15 MG PO TABS: 15.0000 mg | ORAL_TABLET | Freq: Four times a day (QID) | ORAL | 0 refills | Status: DC | PRN

## 2019-04-16 MED FILL — KETOCONAZOLE 2% CREAM: 2 | 30 days supply | Qty: 90 | Fill #0

## 2019-04-16 MED FILL — KETOCONAZOLE 200 MG TABS: 200 | 40 days supply | Qty: 20 | Fill #0

## 2019-04-16 NOTE — Assessment & Plan Note (Signed)
Labs

## 2019-04-16 NOTE — Assessment & Plan Note (Signed)
Moisture wicking underwear Ketocon po and cream

## 2019-04-16 NOTE — Addendum Note (Signed)
Addended by: Karren Cobble on: 04/16/2019 03:33 PM   Modules accepted: Orders

## 2019-04-16 NOTE — Assessment & Plan Note (Signed)
Coreg

## 2019-04-16 NOTE — Assessment & Plan Note (Signed)
Roxycodone 15 mg qid  Potential benefits of a long term opioids use as well as potential risks (i.e. addiction risk, apnea etc) and complications (i.e. Somnolence, constipation and others) were explained to the patient and were aknowledged.

## 2019-04-16 NOTE — Assessment & Plan Note (Signed)
F/u w/Nephrology 

## 2019-04-16 NOTE — Progress Notes (Signed)
Subjective:  Patient ID: [name], female    DOB: May 11, 1951  Age: 68 y.o. MRN: [id number]  CC: No chief complaint on file.   HPI [name] presents for chronic LBP, depression, DM, CRF f/u C/o rash in the skin folds  Outpatient Medications Prior to Visit  Medication Sig Dispense Refill  . amoxicillin (AMOXIL) 500 MG capsule TAKE 4 CAPSULES BY MOUTH 1  HOUR PRIOR TO DENTAL  PROCEDURE/CLEANING 16 capsule 1  . blood glucose meter kit and supplies Dispense based on patient and insurance preference. Used to check blood sugar daily, DX: E11.9 OneTouch 1 each 0  . buPROPion (WELLBUTRIN XL) 300 MG 24 hr tablet TAKE 1 TABLET BY MOUTH  DAILY 90 tablet 3  . carvedilol (COREG) 25 MG tablet TAKE 1/2 TABLET BY MOUTH 2 TIMES DAILY WITH A MEAL. 90 tablet 3  . cyclobenzaprine (FLEXERIL) 10 MG tablet TAKE 3 TABLETS BY MOUTH AT  BEDTIME 270 tablet 0  . DULoxetine (CYMBALTA) 60 MG capsule TAKE 1 CAPSULE BY MOUTH AT  BEDTIME 90 capsule 3  . furosemide (LASIX) 40 MG tablet Take 1-2 tablets (40-80 mg total) by mouth daily. 180 tablet 3  . glimepiride (AMARYL) 1 MG tablet TAKE 1 TABLET BY MOUTH  DAILY BEFORE BREAKFAST 90 tablet 3  . glucose blood (TRUETEST TEST) test strip Used to check blood sugar daily, DX: E11.9 100 each 3  . Lancets (ONETOUCH ULTRASOFT) lancets Used to check blood sugar daily, DX: E11.9 100 each 3  . lidocaine (XYLOCAINE) 5 % ointment Apply topically 3 (three) times daily as needed. 100 g 3  . LORazepam (ATIVAN) 2 MG tablet TAKE 1/2 TO 1 TABLET BY  MOUTH EVERY 6 HOURS AS  NEEDED FOR ANXIETY 90 tablet 3  . Multiple Vitamin (MULTIVITAMIN WITH MINERALS) TABS tablet Take 1 tablet by mouth daily.    Marland Kitchen nystatin-triamcinolone ointment (MYCOLOG) Apply 1 application topically 2 (two) times daily. 30 g 1  . Omega-3 300 MG CAPS Take 300 mg by mouth at bedtime.    Marland Kitchen oxyCODONE (ROXICODONE) 15 MG immediate release tablet Take 1 tablet (15 mg total) by mouth every 6 (six) hours as needed for  pain. 120 tablet 0  . oxyCODONE (ROXICODONE) 15 MG immediate release tablet Take 1 tablet (15 mg total) by mouth 4 (four) times daily. 120 tablet 0  . oxyCODONE (ROXICODONE) 15 MG immediate release tablet Take 1 tablet (15 mg total) by mouth 4 (four) times daily. 120 tablet 0  . pantoprazole (PROTONIX) 40 MG tablet TAKE 1 TABLET BY MOUTH  DAILY 90 tablet 3  . pilocarpine (SALAGEN) 5 MG tablet Take 1 tablet (5 mg total) by mouth 2 (two) times daily. 180 tablet 3  . pioglitazone (ACTOS) 15 MG tablet Take 1 tablet (15 mg total) by mouth daily. 90 tablet 0  . polyvinyl alcohol (LIQUIFILM TEARS) 1.4 % ophthalmic solution Place 1 drop into both eyes daily as needed for dry eyes.    . PSYLLIUM PO Take 1 capsule by mouth 2 (two) times daily.    . solifenacin (VESICARE) 5 MG tablet Take 1 tablet (5 mg total) by mouth daily. 90 tablet 1   No facility-administered medications prior to visit.     ROS: Review of Systems  Constitutional: Positive for fatigue. Negative for activity change, appetite change, chills and unexpected weight change.  HENT: Negative for congestion, mouth sores and sinus pressure.   Eyes: Negative for visual disturbance.  Respiratory: Negative for cough and chest  tightness.   Gastrointestinal: Negative for abdominal pain and nausea.  Genitourinary: Negative for difficulty urinating, frequency and vaginal pain.  Musculoskeletal: Positive for gait problem.  Skin: Negative for pallor and rash.  Neurological: Negative for dizziness, tremors, weakness, numbness and headaches.  Psychiatric/Behavioral: Positive for dysphoric mood and sleep disturbance. Negative for confusion and suicidal ideas. The patient is nervous/anxious.     Objective:  BP 126/82 (BP Location: Left Arm, Patient Position: Sitting, Cuff Size: Normal)   Pulse 76   Temp 97.8 F (36.6 C) (Oral)   Ht '5\' 2"'$  (1.575 m)   Wt 253 lb (114.8 kg)   SpO2 96%   BMI 46.27 kg/m   BP Readings from Last 3 Encounters:   04/16/19 126/82  01/16/19 124/76  10/12/18 126/82    Wt Readings from Last 3 Encounters:  04/16/19 253 lb (114.8 kg)  01/16/19 254 lb (115.2 kg)  10/12/18 247 lb (112 kg)    Physical Exam Constitutional:      General: She is not in acute distress.    Appearance: She is well-developed.  HENT:     Head: Normocephalic.     Right Ear: External ear normal.     Left Ear: External ear normal.     Nose: Nose normal.  Eyes:     General:        Right eye: No discharge.        Left eye: No discharge.     Conjunctiva/sclera: Conjunctivae normal.     Pupils: Pupils are equal, round, and reactive to light.  Neck:     Musculoskeletal: Normal range of motion and neck supple.     Thyroid: No thyromegaly.     Vascular: No JVD.     Trachea: No tracheal deviation.  Cardiovascular:     Rate and Rhythm: Normal rate and regular rhythm.     Heart sounds: Normal heart sounds.  Pulmonary:     Effort: No respiratory distress.     Breath sounds: No stridor. No wheezing.  Abdominal:     General: Bowel sounds are normal. There is no distension.     Palpations: Abdomen is soft. There is no mass.     Tenderness: There is no abdominal tenderness. There is no guarding or rebound.  Musculoskeletal:        General: Tenderness present.  Lymphadenopathy:     Cervical: No cervical adenopathy.  Skin:    Findings: No erythema or rash.  Neurological:     Mental Status: She is oriented to person, place, and time.     Cranial Nerves: No cranial nerve deficit.     Motor: No abnormal muscle tone.     Coordination: Coordination normal.     Gait: Gait abnormal.     Deep Tendon Reflexes: Reflexes normal.  Psychiatric:        Behavior: Behavior normal.        Thought Content: Thought content normal.        Judgment: Judgment normal.   intertrigo Obese Pain in the knees  Lab Results  Component Value Date   WBC 8.9 10/13/2017   HGB 10.2 (L) 10/13/2017   HCT 31.6 (L) 10/13/2017   PLT 239.0 10/13/2017    GLUCOSE 92 01/16/2019   CHOL 215 (H) 09/25/2014   TRIG 154.0 (H) 09/25/2014   HDL 46.00 09/25/2014   LDLCALC 138 (H) 09/25/2014   ALT 10 08/20/2016   AST 12 08/20/2016   NA 134 (L) 01/16/2019   K 4.4 01/16/2019  CL 96 01/16/2019   CREATININE 2.22 (H) 01/16/2019   BUN 46 (H) 01/16/2019   CO2 30 01/16/2019   TSH 1.64 07/25/2013   INR 1.04 02/26/2015   HGBA1C 6.7 (H) 01/16/2019    Dg Ribs Unilateral Left  Result Date: 04/24/2018 CLINICAL DATA:  Fall with chest wall pain EXAM: LEFT RIBS - 2 VIEW COMPARISON:  Chest x-ray 03/01/2016 FINDINGS: No pneumothorax or pleural effusion. Atelectasis at the left lung base. Acute displaced left fourth, fifth, sixth, and eighth rib fractures. IMPRESSION: 1. Acute displaced left fourth, fifth, sixth and eighth rib fractures. 2. No definitive left pneumothorax or pleural effusion seen. These results will be called to the ordering clinician or representative by the Radiologist Assistant, and communication documented in the PACS or zVision Dashboard. Electronically Signed   By: Donavan Foil M.D.   On: 04/24/2018 20:59    Assessment & Plan:   There are no diagnoses linked to this encounter.   No orders of the defined types were placed in this encounter.    Follow-up: No follow-ups on file.  [name], MD

## 2019-04-17 LAB — PMP SCREEN PROFILE (10S), URINE
Amphetamine Scrn, Ur: NEGATIVE ng/mL
BARBITURATE SCREEN URINE: NEGATIVE ng/mL
BENZODIAZEPINE SCREEN, URINE: NEGATIVE ng/mL
CANNABINOIDS UR QL SCN: NEGATIVE ng/mL
Cocaine (Metab) Scrn, Ur: NEGATIVE ng/mL
Creatinine(Crt), U: 51.7 mg/dL (ref 20.0–300.0)
Methadone Screen, Urine: NEGATIVE ng/mL
OXYCODONE+OXYMORPHONE UR QL SCN: POSITIVE ng/mL — AB
Opiate Scrn, Ur: POSITIVE ng/mL — AB
Ph of Urine: 5.3 (ref 4.5–8.9)
Phencyclidine Qn, Ur: NEGATIVE ng/mL
Propoxyphene Scrn, Ur: NEGATIVE ng/mL

## 2019-05-01 ENCOUNTER — Other Ambulatory Visit: Payer: Self-pay

## 2019-05-01 MED ORDER — CYCLOBENZAPRINE HCL 10 MG PO TABS: 30.0000 mg | ORAL_TABLET | Freq: Every day | ORAL | 1 refills | Status: DC

## 2019-05-01 NOTE — Telephone Encounter (Signed)
Copied from [place] [id number]. Topic: Quick Communication - Rx Refill/Question >> Apr 27, 2019 10:03 AM Carolyn Stare wrote: Medication cyclobenzaprine (FLEXERIL) 10 MG tablet  Preferred Pharmacy  [facility]   Agent: Please be advised that RX refills may take up to 3 business days. We ask that you follow-up with your pharmacy.

## 2019-05-03 MED FILL — oxyCODONE HCL 15 MG TABS: 15 | 30 days supply | Qty: 120 | Fill #0

## 2019-05-09 ENCOUNTER — Telehealth: Payer: Self-pay | Admitting: Internal Medicine

## 2019-05-09 MED ORDER — SOLIFENACIN SUCCINATE 5 MG PO TABS: 5.0000 mg | ORAL_TABLET | Freq: Every day | ORAL | 1 refills | Status: DC

## 2019-05-09 NOTE — Telephone Encounter (Signed)
Copied from [place] [id number]. Topic: Quick Communication - Rx Refill/Question >> May 09, 2019  3:03 PM Celene Kras A wrote: Medication: solifenacin (VESICARE) 5 MG tablet  Has the patient contacted their pharmacy? Yes.   (Agent: If no, request that the patient contact the pharmacy for the refill.) (Agent: If yes, when and what did the pharmacy advise?)  Preferred Pharmacy (with phone number or street name): [place], [place] [facility] [address] [place] [address] Phone: [phone] Fax: [phone] Not a 24 hour pharmacy; exact hours not known.    Agent: Please be advised that RX refills may take up to 3 business days. We ask that you follow-up with your pharmacy.

## 2019-05-09 NOTE — Telephone Encounter (Signed)
RX sent

## 2019-06-04 MED FILL — oxyCODONE HCL 15 MG TABS: 15 | 30 days supply | Qty: 120 | Fill #0

## 2019-07-03 DIAGNOSIS — I10 Essential (primary) hypertension: Secondary | ICD-10-CM | POA: Diagnosis not present

## 2019-07-03 DIAGNOSIS — D631 Anemia in chronic kidney disease: Secondary | ICD-10-CM | POA: Diagnosis not present

## 2019-07-03 DIAGNOSIS — N2581 Secondary hyperparathyroidism of renal origin: Secondary | ICD-10-CM | POA: Diagnosis not present

## 2019-07-03 DIAGNOSIS — N189 Chronic kidney disease, unspecified: Secondary | ICD-10-CM | POA: Diagnosis not present

## 2019-07-03 DIAGNOSIS — E1129 Type 2 diabetes mellitus with other diabetic kidney complication: Secondary | ICD-10-CM | POA: Diagnosis not present

## 2019-07-04 MED FILL — oxyCODONE HCL 15 MG TABS: 15 | 30 days supply | Qty: 120 | Fill #0

## 2019-07-11 DIAGNOSIS — E119 Type 2 diabetes mellitus without complications: Secondary | ICD-10-CM | POA: Diagnosis not present

## 2019-07-11 DIAGNOSIS — H52223 Regular astigmatism, bilateral: Secondary | ICD-10-CM | POA: Diagnosis not present

## 2019-07-11 DIAGNOSIS — H524 Presbyopia: Secondary | ICD-10-CM | POA: Diagnosis not present

## 2019-07-12 MED FILL — CHLORHEXIDINE 0.12% RINSE: 0.12 | 14 days supply | Qty: 473 | Fill #0

## 2019-07-12 MED FILL — DOXYCYCLINE HYCLATE 100 MG: 100 | 30 days supply | Qty: 60 | Fill #0

## 2019-07-14 ENCOUNTER — Other Ambulatory Visit: Payer: Self-pay | Admitting: Internal Medicine

## 2019-07-16 ENCOUNTER — Other Ambulatory Visit: Payer: Self-pay | Admitting: Internal Medicine

## 2019-07-17 ENCOUNTER — Ambulatory Visit (INDEPENDENT_AMBULATORY_CARE_PROVIDER_SITE_OTHER): Payer: Medicare Other | Admitting: Internal Medicine

## 2019-07-17 ENCOUNTER — Other Ambulatory Visit: Payer: Self-pay

## 2019-07-17 ENCOUNTER — Other Ambulatory Visit (INDEPENDENT_AMBULATORY_CARE_PROVIDER_SITE_OTHER): Payer: Medicare Other

## 2019-07-17 ENCOUNTER — Encounter: Payer: Self-pay | Admitting: Internal Medicine

## 2019-07-17 DIAGNOSIS — F411 Generalized anxiety disorder: Secondary | ICD-10-CM

## 2019-07-17 DIAGNOSIS — E119 Type 2 diabetes mellitus without complications: Secondary | ICD-10-CM

## 2019-07-17 DIAGNOSIS — G8929 Other chronic pain: Secondary | ICD-10-CM

## 2019-07-17 DIAGNOSIS — M545 Low back pain, unspecified: Secondary | ICD-10-CM

## 2019-07-17 DIAGNOSIS — F4321 Adjustment disorder with depressed mood: Secondary | ICD-10-CM | POA: Diagnosis not present

## 2019-07-17 DIAGNOSIS — Z23 Encounter for immunization: Secondary | ICD-10-CM | POA: Diagnosis not present

## 2019-07-17 LAB — HEMOGLOBIN A1C: Hgb A1c MFr Bld: 6.5 % (ref 4.6–6.5)

## 2019-07-17 LAB — BASIC METABOLIC PANEL
BUN: 70 mg/dL — ABNORMAL HIGH (ref 6–23)
CO2: 27 mEq/L (ref 19–32)
Calcium: 9.6 mg/dL (ref 8.4–10.5)
Chloride: 97 mEq/L (ref 96–112)
Creatinine, Ser: 2.26 mg/dL — ABNORMAL HIGH (ref 0.40–1.20)
GFR: 21.55 mL/min — ABNORMAL LOW (ref >60.00)
Glucose, Bld: 142 mg/dL — ABNORMAL HIGH (ref 70–99)
Potassium: 4.7 mEq/L (ref 3.5–5.1)
Sodium: 134 mEq/L — ABNORMAL LOW (ref 135–145)

## 2019-07-17 MED ORDER — OXYCODONE HCL 15 MG PO TABS: 15.0000 mg | ORAL_TABLET | Freq: Four times a day (QID) | ORAL | 0 refills | Status: DC | PRN

## 2019-07-17 MED ORDER — OXYCODONE HCL 15 MG PO TABS: 15.0000 mg | ORAL_TABLET | Freq: Four times a day (QID) | ORAL | 0 refills | Status: DC

## 2019-07-17 NOTE — Progress Notes (Signed)
Subjective:  Patient ID: [name], female    DOB: July 02, 1951  Age: 68 y.o. MRN: [id number]  CC: No chief complaint on file.   HPI ELLARIE PICKING presents for LBP/OA, CRF, DM f/u Her aunt died 2023-07-09 - sad C/o wt gain  Outpatient Medications Prior to Visit  Medication Sig Dispense Refill  . amoxicillin (AMOXIL) 500 MG capsule TAKE 4 CAPSULES BY MOUTH 1  HOUR PRIOR TO DENTAL  PROCEDURE/CLEANING 16 capsule 1  . blood glucose meter kit and supplies Dispense based on patient and insurance preference. Used to check blood sugar daily, DX: E11.9 OneTouch 1 each 0  . buPROPion (WELLBUTRIN XL) 300 MG 24 hr tablet TAKE 1 TABLET BY MOUTH  DAILY 90 tablet 3  . carvedilol (COREG) 25 MG tablet TAKE 1/2 TABLET BY MOUTH 2 TIMES DAILY WITH A MEAL. 90 tablet 3  . chlorhexidine (PERIDEX) 0.12 % solution Use as directed in the mouth or throat 2 (two) times daily.     . cyclobenzaprine (FLEXERIL) 10 MG tablet TAKE 3 TABLETS BY MOUTH AT  BEDTIME 270 tablet 1  . doxycycline (VIBRA-TABS) 100 MG tablet Take 100 mg by mouth 2 (two) times daily.     . DULoxetine (CYMBALTA) 60 MG capsule TAKE 1 CAPSULE BY MOUTH AT  BEDTIME 90 capsule 3  . furosemide (LASIX) 40 MG tablet Take 1-2 tablets (40-80 mg total) by mouth daily. 180 tablet 3  . glimepiride (AMARYL) 1 MG tablet TAKE 1 TABLET BY MOUTH  DAILY BEFORE BREAKFAST 90 tablet 3  . glucose blood (TRUETEST TEST) test strip Used to check blood sugar daily, DX: E11.9 100 each 3  . ketoconazole (NIZORAL) 2 % cream Apply 1 application topically 2 (two) times daily. 90 g 3  . ketoconazole (NIZORAL) 200 MG tablet Take 0.5 tablets (100 mg total) by mouth daily. 20 tablet 2  . Lancets (ONETOUCH ULTRASOFT) lancets Used to check blood sugar daily, DX: E11.9 100 each 3  . lidocaine (XYLOCAINE) 5 % ointment Apply topically 3 (three) times daily as needed. 100 g 3  . LORazepam (ATIVAN) 2 MG tablet TAKE 1/2 TO 1 TABLET BY  MOUTH EVERY 6 HOURS AS  NEEDED FOR ANXIETY 90 tablet 1   . Multiple Vitamin (MULTIVITAMIN WITH MINERALS) TABS tablet Take 1 tablet by mouth daily.    . Omega-3 300 MG CAPS Take 300 mg by mouth at bedtime.    Marland Kitchen oxyCODONE (ROXICODONE) 15 MG immediate release tablet Take 1 tablet (15 mg total) by mouth every 6 (six) hours as needed for pain. 120 tablet 0  . oxyCODONE (ROXICODONE) 15 MG immediate release tablet Take 1 tablet (15 mg total) by mouth 4 (four) times daily. 120 tablet 0  . oxyCODONE (ROXICODONE) 15 MG immediate release tablet Take 1 tablet (15 mg total) by mouth 4 (four) times daily. 120 tablet 0  . pantoprazole (PROTONIX) 40 MG tablet TAKE 1 TABLET BY MOUTH  DAILY 90 tablet 3  . pilocarpine (SALAGEN) 5 MG tablet Take 1 tablet (5 mg total) by mouth 2 (two) times daily. 180 tablet 3  . pioglitazone (ACTOS) 15 MG tablet TAKE 1 TABLET BY MOUTH  DAILY 90 tablet 3  . polyvinyl alcohol (LIQUIFILM TEARS) 1.4 % ophthalmic solution Place 1 drop into both eyes daily as needed for dry eyes.    . PSYLLIUM PO Take 1 capsule by mouth 2 (two) times daily.    . solifenacin (VESICARE) 5 MG tablet Take 1 tablet (5 mg total)  by mouth daily. 90 tablet 1   No facility-administered medications prior to visit.     ROS: Review of Systems  Constitutional: Positive for unexpected weight change. Negative for activity change, appetite change, chills and fatigue.  HENT: Negative for congestion, mouth sores and sinus pressure.   Eyes: Negative for visual disturbance.  Respiratory: Negative for cough and chest tightness.   Gastrointestinal: Negative for abdominal pain and nausea.  Genitourinary: Negative for difficulty urinating, frequency and vaginal pain.  Musculoskeletal: Positive for arthralgias, back pain and gait problem.  Skin: Negative for pallor and rash.  Neurological: Negative for dizziness, tremors, weakness, numbness and headaches.  Psychiatric/Behavioral: Positive for dysphoric mood and sleep disturbance. Negative for confusion and suicidal ideas. The  patient is nervous/anxious.     Objective:  BP (!) 152/90 (BP Location: Left Arm, Patient Position: Sitting, Cuff Size: Large)   Pulse 91   Temp 98.1 F (36.7 C) (Oral)   Ht '5\' 2"'$  (1.575 m)   Wt 259 lb (117.5 kg)   SpO2 95%   BMI 47.37 kg/m   BP Readings from Last 3 Encounters:  07/17/19 (!) 152/90  04/16/19 126/82  01/16/19 124/76    Wt Readings from Last 3 Encounters:  07/17/19 259 lb (117.5 kg)  04/16/19 253 lb (114.8 kg)  01/16/19 254 lb (115.2 kg)    Physical Exam Constitutional:      General: She is not in acute distress.    Appearance: She is well-developed. She is obese.  HENT:     Head: Normocephalic.     Right Ear: External ear normal.     Left Ear: External ear normal.     Nose: Nose normal.  Eyes:     General:        Right eye: No discharge.        Left eye: No discharge.     Conjunctiva/sclera: Conjunctivae normal.     Pupils: Pupils are equal, round, and reactive to light.  Neck:     Musculoskeletal: Normal range of motion and neck supple.     Thyroid: No thyromegaly.     Vascular: No JVD.     Trachea: No tracheal deviation.  Cardiovascular:     Rate and Rhythm: Normal rate and regular rhythm.     Heart sounds: Normal heart sounds.  Pulmonary:     Effort: No respiratory distress.     Breath sounds: No stridor. No wheezing.  Abdominal:     General: Bowel sounds are normal. There is no distension.     Palpations: Abdomen is soft. There is no mass.     Tenderness: There is no abdominal tenderness. There is no guarding or rebound.  Musculoskeletal:        General: Tenderness present.  Lymphadenopathy:     Cervical: No cervical adenopathy.  Skin:    Findings: No erythema or rash.  Neurological:     Cranial Nerves: No cranial nerve deficit.     Motor: Weakness present. No abnormal muscle tone.     Coordination: Coordination normal.     Gait: Gait abnormal.     Deep Tendon Reflexes: Reflexes normal.  Psychiatric:        Behavior: Behavior  normal.        Thought Content: Thought content normal.        Judgment: Judgment normal.   LS, knees w/pain   Lab Results  Component Value Date   WBC 8.9 10/13/2017   HGB 10.2 (L) 10/13/2017   HCT  31.6 (L) 10/13/2017   PLT 239.0 10/13/2017   GLUCOSE 158 (H) 04/16/2019   CHOL 215 (H) 09/25/2014   TRIG 154.0 (H) 09/25/2014   HDL 46.00 09/25/2014   LDLCALC 138 (H) 09/25/2014   ALT 10 08/20/2016   AST 12 08/20/2016   NA 137 04/16/2019   K 4.2 04/16/2019   CL 99 04/16/2019   CREATININE 2.23 (H) 04/16/2019   BUN 53 (H) 04/16/2019   CO2 27 04/16/2019   TSH 1.64 07/25/2013   INR 1.04 02/26/2015   HGBA1C 6.8 (H) 04/16/2019    Dg Ribs Unilateral Left  Result Date: 04/24/2018 CLINICAL DATA:  Fall with chest wall pain EXAM: LEFT RIBS - 2 VIEW COMPARISON:  Chest x-ray 03/01/2016 FINDINGS: No pneumothorax or pleural effusion. Atelectasis at the left lung base. Acute displaced left fourth, fifth, sixth, and eighth rib fractures. IMPRESSION: 1. Acute displaced left fourth, fifth, sixth and eighth rib fractures. 2. No definitive left pneumothorax or pleural effusion seen. These results will be called to the ordering clinician or representative by the Radiologist Assistant, and communication documented in the PACS or zVision Dashboard. Electronically Signed   By: Donavan Foil M.D.   On: 04/24/2018 20:59    Assessment & Plan:     Follow-up: No follow-ups on file.  [name], MD

## 2019-07-17 NOTE — Assessment & Plan Note (Signed)
Lorazepam prn  Potential benefits of a long term benzodiazepines  use as well as potential risks  and complications were explained to the patient and were aknowledged. Not to take w/Oxycodone

## 2019-07-17 NOTE — Addendum Note (Signed)
Addended by: Ander Slade on: 07/17/2019 04:16 PM   Modules accepted: Orders

## 2019-07-17 NOTE — Assessment & Plan Note (Signed)
Dr Dwyane Dee Actos

## 2019-07-17 NOTE — Patient Instructions (Signed)
These suggestions will probably help you to improve your metabolism if you are not overweight and to lose weight if you are overweight: 1.  Reduce your consumption of sugars and starches.  Eliminate high fructose corn syrup from your diet.  Reduce your consumption of processed foods.  For desserts try to have seasonal fruits, berries, nuts, cheeses or dark chocolate with more than 70% cacao. 2.  Do not snack 3.  You do not have to eat breakfast.  If you choose to have breakfast-eat plain greek yogurt, eggs, oatmeal (without sugar) 4.  Drink water, freshly brewed unsweetened tea (green, black or herbal) or coffee.  Do not drink sodas including diet sodas , juices, beverages sweetened with artificial sweeteners. 5.  Reduce your consumption of refined grains. 6.  Avoid protein drinks such as Optifast, Slim fast etc. Eat chicken, fish, meat, dairy and beans for your sources of protein 7.  Natural unprocessed fats like cold pressed virgin olive oil, butter, coconut oil are good for you.  Eat avocados 8.  Increase your consumption of fiber.  Fruits, berries, vegetables, whole grains, flaxseeds, Chia seeds, beans, popcorn, nuts, oatmeal are good sources of fiber 9.  Use vinegar in your diet, i.e. apple cider vinegar, red wine or balsamic vinegar 10.  You can try fasting.  For example you can skip breakfast and lunch every other day (24-hour fast) 11.  Stress reduction, good night sleep, relaxation, meditation, yoga and other physical activity is likely to help you to maintain low weight too. 12.  If you drink alcohol, limit your alcohol intake to no more than 2 drinks a day.   Mediterranean diet is good for you. ([employer] has a typical Mediterranean cuisine menu) The Mediterranean diet is a way of eating based on the traditional cuisine of countries bordering the [place] Sea. While there is no single definition of the Mediterranean diet, it is typically high in vegetables, fruits, whole grains,  beans, nut and seeds, and olive oil. The main components of Mediterranean diet include: . Daily consumption of vegetables, fruits, whole grains and healthy fats  . Weekly intake of fish, poultry, beans and eggs  . Moderate portions of dairy products  . Limited intake of red meat Other important elements of the Mediterranean diet are sharing meals with family and friends, enjoying a glass of red wine and being physically active. Health benefits of a Mediterranean diet: A traditional Mediterranean diet consisting of large quantities of fresh fruits and vegetables, nuts, fish and olive oil-coupled with physical activity-can reduce your risk of serious mental and physical health problems by: Preventing heart disease and strokes. Following a Mediterranean diet limits your intake of refined breads, processed foods, and red meat, and encourages drinking red wine instead of hard liquor-all factors that can help prevent heart disease and stroke. Keeping you agile. If you're an older adult, the nutrients gained with a Mediterranean diet may reduce your risk of developing muscle weakness and other signs of frailty by about 70 percent. Reducing the risk of Alzheimer's. Research suggests that the Mediterranean diet may improve cholesterol, blood sugar levels, and overall blood vessel health, which in turn may reduce your risk of Alzheimer's disease or dementia. Halving the risk of Parkinson's disease. The high levels of antioxidants in the Mediterranean diet can prevent cells from undergoing a damaging process called oxidative stress, thereby cutting the risk of Parkinson's disease in half. Increasing longevity. By reducing your risk of developing heart disease or cancer with the Mediterranean diet,   you're reducing your risk of death at any age by 20%. Protecting against type 2 diabetes. A Mediterranean diet is rich in fiber which digests slowly, prevents huge swings in blood sugar, and can help you maintain a  healthy weight.    Cabbage soup recipe that will not make you gain weight: Take 1 small head of cabbage, 1 average pack of celery, 4 green peppers, 4 onions, 2 cans diced tomatoes (they are not available without salt), salt and spices to taste.  Chop cabbage, celery, peppers and onions.  And tomatoes and 2-2.5 liters (2.5 quarts) of water so that it would just cover the vegetables.  Bring to boil.  Add spices and salt.  Turn heat to low/medium and simmer for 20-25 minutes.  Naturally, you can make a smaller batch and change some of the ingredients.   If you have medicare related insurance (such as traditional Medicare, [employer], [employer], or similar), Please make an appointment at the scheduling desk with [name], the [employer], for your Wellness visit in this office, which is a benefit with your insurance.

## 2019-07-17 NOTE — Assessment & Plan Note (Signed)
Her aunt died July 2nd, 2020  grieving

## 2019-07-17 NOTE — Assessment & Plan Note (Signed)
Roxycodone 15 mg qid  Potential benefits of a long term opioids use as well as potential risks (i.e. addiction risk, apnea etc) and complications (i.e. Somnolence, constipation and others) were explained to the patient and were aknowledged.

## 2019-07-18 LAB — PMP SCREEN PROFILE (10S), URINE
Amphetamine Scrn, Ur: NEGATIVE ng/mL
BARBITURATE SCREEN URINE: NEGATIVE ng/mL
BENZODIAZEPINE SCREEN, URINE: NEGATIVE ng/mL
CANNABINOIDS UR QL SCN: NEGATIVE ng/mL
Cocaine (Metab) Scrn, Ur: NEGATIVE ng/mL
Creatinine(Crt), U: 20.7 mg/dL (ref 20.0–300.0)
Methadone Screen, Urine: NEGATIVE ng/mL
OXYCODONE+OXYMORPHONE UR QL SCN: POSITIVE ng/mL — AB
Opiate Scrn, Ur: POSITIVE ng/mL — AB
Ph of Urine: 5.2 (ref 4.5–8.9)
Phencyclidine Qn, Ur: NEGATIVE ng/mL
Propoxyphene Scrn, Ur: NEGATIVE ng/mL

## 2019-08-01 ENCOUNTER — Other Ambulatory Visit: Payer: Self-pay | Admitting: Internal Medicine

## 2019-08-01 MED FILL — FUROSEMIDE 40 MG TAB: 40 | 90 days supply | Qty: 180 | Fill #0

## 2019-08-03 MED FILL — oxyCODONE HCL 15 MG TABS: 15 | 30 days supply | Qty: 120 | Fill #0

## 2019-08-04 MED FILL — DOXYCYCLINE HYCLATE 100 MG: 100 | 30 days supply | Qty: 60 | Fill #1

## 2019-08-13 ENCOUNTER — Telehealth: Payer: Self-pay | Admitting: Internal Medicine

## 2019-08-13 MED ORDER — FUROSEMIDE 40 MG PO TABS: 40.0000 mg | ORAL_TABLET | Freq: Every day | ORAL | 1 refills | Status: DC

## 2019-08-13 NOTE — Telephone Encounter (Signed)
Medication: furosemide (LASIX) 40 MG tablet [[id number]]  was sent to the wrong pharmacy can it be sent to the pharmacy below?  Has the patient contacted their pharmacy? Yes  (Agent: If no, request that the patient contact the pharmacy for the refill.) (Agent: If yes, when and what did the pharmacy advise?)  Preferred Pharmacy (with phone number or street name): [facility], [place] [phone] (Phone) [phone] (Fax)    Agent: Please be advised that RX refills may take up to 3 business days. We ask that you follow-up with your pharmacy.

## 2019-08-13 NOTE — Telephone Encounter (Signed)
Updated rx sent to [facility] mail order. See meds.

## 2019-08-27 ENCOUNTER — Ambulatory Visit: Payer: Medicare Other

## 2019-08-27 DIAGNOSIS — M79672 Pain in left foot: Secondary | ICD-10-CM | POA: Diagnosis not present

## 2019-08-29 ENCOUNTER — Ambulatory Visit (INDEPENDENT_AMBULATORY_CARE_PROVIDER_SITE_OTHER): Payer: Medicare Other

## 2019-08-29 DIAGNOSIS — Z23 Encounter for immunization: Secondary | ICD-10-CM | POA: Diagnosis not present

## 2019-09-04 MED FILL — oxyCODONE HCL 15 MG TABS: 15 | 30 days supply | Qty: 120 | Fill #0

## 2019-09-11 ENCOUNTER — Other Ambulatory Visit: Payer: Self-pay | Admitting: Internal Medicine

## 2019-09-12 DIAGNOSIS — M545 Low back pain: Secondary | ICD-10-CM | POA: Diagnosis not present

## 2019-09-12 DIAGNOSIS — M25551 Pain in right hip: Secondary | ICD-10-CM | POA: Diagnosis not present

## 2019-09-12 DIAGNOSIS — M4716 Other spondylosis with myelopathy, lumbar region: Secondary | ICD-10-CM | POA: Insufficient documentation

## 2019-09-21 DIAGNOSIS — M25551 Pain in right hip: Secondary | ICD-10-CM | POA: Diagnosis not present

## 2019-09-21 DIAGNOSIS — M545 Low back pain: Secondary | ICD-10-CM | POA: Diagnosis not present

## 2019-09-25 ENCOUNTER — Other Ambulatory Visit: Payer: Self-pay

## 2019-09-27 ENCOUNTER — Other Ambulatory Visit: Payer: Self-pay

## 2019-09-27 DIAGNOSIS — Z20822 Contact with and (suspected) exposure to covid-19: Secondary | ICD-10-CM

## 2019-09-29 LAB — NOVEL CORONAVIRUS, NAA: SARS-CoV-2, NAA: NOT DETECTED

## 2019-10-01 ENCOUNTER — Telehealth: Payer: Self-pay | Admitting: General Practice

## 2019-10-01 NOTE — Telephone Encounter (Signed)
noted 

## 2019-10-01 NOTE — Telephone Encounter (Signed)
Gave patient negative covid test  Patient understood

## 2019-10-04 DIAGNOSIS — M25551 Pain in right hip: Secondary | ICD-10-CM | POA: Diagnosis not present

## 2019-10-04 DIAGNOSIS — M4716 Other spondylosis with myelopathy, lumbar region: Secondary | ICD-10-CM | POA: Diagnosis not present

## 2019-10-04 MED FILL — oxyCODONE HCL 15 MG TABS: 15 | 30 days supply | Qty: 120 | Fill #0

## 2019-10-10 MED FILL — KETOCONAZOLE 200 MG TABS: 200 | 40 days supply | Qty: 20 | Fill #1

## 2019-10-11 ENCOUNTER — Ambulatory Visit (INDEPENDENT_AMBULATORY_CARE_PROVIDER_SITE_OTHER): Payer: Medicare Other | Admitting: Internal Medicine

## 2019-10-11 ENCOUNTER — Other Ambulatory Visit: Payer: Self-pay

## 2019-10-11 ENCOUNTER — Telehealth: Payer: Self-pay

## 2019-10-11 ENCOUNTER — Other Ambulatory Visit (INDEPENDENT_AMBULATORY_CARE_PROVIDER_SITE_OTHER): Payer: Medicare Other

## 2019-10-11 ENCOUNTER — Encounter: Payer: Self-pay | Admitting: Internal Medicine

## 2019-10-11 ENCOUNTER — Ambulatory Visit: Payer: Self-pay | Admitting: *Deleted

## 2019-10-11 VITALS — BP 138/90 | HR 89 | Temp 97.8°F | Ht 62.0 in | Wt 259.0 lb

## 2019-10-11 DIAGNOSIS — M545 Low back pain, unspecified: Secondary | ICD-10-CM

## 2019-10-11 DIAGNOSIS — M7989 Other specified soft tissue disorders: Secondary | ICD-10-CM

## 2019-10-11 DIAGNOSIS — M8949 Other hypertrophic osteoarthropathy, multiple sites: Secondary | ICD-10-CM | POA: Diagnosis not present

## 2019-10-11 DIAGNOSIS — R609 Edema, unspecified: Secondary | ICD-10-CM

## 2019-10-11 DIAGNOSIS — G8929 Other chronic pain: Secondary | ICD-10-CM | POA: Diagnosis not present

## 2019-10-11 DIAGNOSIS — M159 Polyosteoarthritis, unspecified: Secondary | ICD-10-CM

## 2019-10-11 LAB — CBC
HCT: 30.5 % — ABNORMAL LOW (ref 36.0–46.0)
Hemoglobin: 9.9 g/dL — ABNORMAL LOW (ref 12.0–15.0)
MCHC: 32.6 g/dL (ref 30.0–36.0)
MCV: 94.3 fl (ref 78.0–100.0)
Platelets: 214 10*3/uL (ref 150.0–400.0)
RBC: 3.23 Mil/uL — ABNORMAL LOW (ref 3.87–5.11)
RDW: 13.3 % (ref 11.5–15.5)
WBC: 11.8 10*3/uL — ABNORMAL HIGH (ref 4.0–10.5)

## 2019-10-11 LAB — COMPREHENSIVE METABOLIC PANEL
ALT: 18 U/L (ref 0–35)
AST: 18 U/L (ref 0–37)
Albumin: 3.9 g/dL (ref 3.5–5.2)
Alkaline Phosphatase: 142 U/L — ABNORMAL HIGH (ref 39–117)
BUN: 61 mg/dL — ABNORMAL HIGH (ref 6–23)
CO2: 28 mEq/L (ref 19–32)
Calcium: 9.4 mg/dL (ref 8.4–10.5)
Chloride: 90 mEq/L — ABNORMAL LOW (ref 96–112)
Creatinine, Ser: 2.79 mg/dL — ABNORMAL HIGH (ref 0.40–1.20)
GFR: 16.89 mL/min — ABNORMAL LOW (ref >60.00)
Glucose, Bld: 142 mg/dL — ABNORMAL HIGH (ref 70–99)
Potassium: 3.6 mEq/L (ref 3.5–5.1)
Sodium: 129 mEq/L — ABNORMAL LOW (ref 135–145)
Total Bilirubin: 0.4 mg/dL (ref 0.2–1.2)
Total Protein: 7.2 g/dL (ref 6.0–8.3)

## 2019-10-11 NOTE — Telephone Encounter (Signed)
Patient is calling to report she had significant swelling in legs- patient did increase medication per Dr Clover Mealy- Lasix 80 mg twice/day. Patient is still taking that and has had good result- patient is concerned that she has a change in her kidney function. Patient has appointment next week- she wants to know if she needs labs before the visit. Patient states she did- slide out of the bed due to her position in the bed and had to call EMS to help her - 10 days ago. Patient states she is covered in bruises. Call to office for appointment- she may not take if she can't see PCP  Reason for Disposition . [1] MODERATE leg swelling (e.g., swelling extends up to knees) AND [2] new onset or worsening  Answer Assessment - Initial Assessment Questions 1. ONSET: "When did the swelling start?" (e.g., minutes, hours, days)     2 weeks increased swelling up to knees with 4+ edema 2. LOCATION: "What part of the leg is swollen?"  "Are both legs swollen or just one leg?"     Bilateral swelling in legs, left foot is worse 3. SEVERITY: "How bad is the swelling?" (e.g., localized; mild, moderate, severe)  - Localized - small area of swelling localized to one leg  - MILD pedal edema - swelling limited to foot and ankle, pitting edema < 1/4 inch (6 mm) deep, rest and elevation eliminate most or all swelling  - MODERATE edema - swelling of lower leg to knee, pitting edema > 1/4 inch (6 mm) deep, rest and elevation only partially reduce swelling  - SEVERE edema - swelling extends above knee, facial or hand swelling present      moderate 4. REDNESS: "Does the swelling look red or infected?"     no 5. PAIN: "Is the swelling painful to touch?" If so, ask: "How painful is it?"   (Scale 1-10; mild, moderate or severe)     Left foot is painful- 3-4 6. FEVER: "Do you have a fever?" If so, ask: "What is it, how was it measured, and when did it start?"      1 day of fever-99.2 - patient states she got tested for COVID-  negative 7. CAUSE: "What do you think is causing the leg swelling?"     Kidney disease 8. MEDICAL HISTORY: "Do you have a history of heart failure, kidney disease, liver failure, or cancer?"     Kidney disease  9. RECURRENT SYMPTOM: "Have you had leg swelling before?" If so, ask: "When was the last time?" "What happened that time?"     Yes- patient called Dr Clover Mealy and was told to increase Lasix to 80 mg twice daily- last Friday  10. OTHER SYMPTOMS: "Do you have any other symptoms?" (e.g., chest pain, difficulty breathing)      No- patient reports no other extremity swelling - abdomen- not sure 11. PREGNANCY: "Is there any chance you are pregnant?" "When was your last menstrual period?"       n/a  Protocols used: LEG SWELLING AND EDEMA-A-AH

## 2019-10-11 NOTE — Progress Notes (Signed)
   Subjective:   Patient ID: [name], female    DOB: 01-25-1951, 68 y.o.   MRN: [id number]  HPI The patient is a 68 YO female coming in for swelling in her legs and ankles. This appears to be chronic from notes. She does have chronic swelling in her legs and CKD. She called her kidney doctor and they increased lasix from 40 mg daily to 80 mg BID which she has been taking. This has helped the swelling a lot. She is still concerned about the cause. She is wanting to get UDS checked since here today.  Review of Systems  Constitutional: Positive for activity change and fatigue.  HENT: Negative.   Eyes: Negative.   Respiratory: Negative for cough, chest tightness and shortness of breath.   Cardiovascular: Positive for leg swelling. Negative for chest pain and palpitations.  Gastrointestinal: Negative for abdominal distention, abdominal pain, constipation, diarrhea, nausea and vomiting.  Musculoskeletal: Positive for arthralgias and myalgias.  Skin: Negative.   Neurological: Negative.     Objective:  Physical Exam Constitutional:      Appearance: She is well-developed. She is obese.  HENT:     Head: Normocephalic and atraumatic.  Neck:     Musculoskeletal: Normal range of motion.  Cardiovascular:     Rate and Rhythm: Normal rate.  Pulmonary:     Effort: Pulmonary effort is normal. No respiratory distress.     Breath sounds: Normal breath sounds. No wheezing or rales.  Abdominal:     General: Bowel sounds are normal. There is no distension.     Palpations: Abdomen is soft.     Tenderness: There is no abdominal tenderness. There is no rebound.  Musculoskeletal:        General: Tenderness present.     Right lower leg: Edema present.     Left lower leg: Edema present.  Skin:    General: Skin is warm and dry.  Neurological:     Mental Status: She is alert and oriented to person, place, and time.     Coordination: Coordination abnormal.     Vitals:   10/11/19 1405  BP:  138/90  Pulse: 89  Temp: 97.8 F (36.6 C)  TempSrc: Oral  SpO2: 99%  Weight: 259 lb (117.5 kg)  Height: 5\' 2"  (1.575 m)    Assessment & Plan:

## 2019-10-11 NOTE — Telephone Encounter (Signed)
Patient fell in our front area of our building. Jenny Reichmann was out there and saw it and [place], [place], Jenny Reichmann and I helped patient up into a wheel chair and Stefannie and I walked patient to the car. Patient left elbow is swollen and bruised. Patient was talking about wanting to got to [place] and just being done with it. Just wanted to document what happened and what was said.

## 2019-10-11 NOTE — Telephone Encounter (Signed)
Noted. Thx.

## 2019-10-11 NOTE — Patient Instructions (Signed)
We will check the labs today and think about getting some more compression.

## 2019-10-11 NOTE — Telephone Encounter (Signed)
Appointment made for today 10/29

## 2019-10-11 NOTE — Telephone Encounter (Signed)
Noted, will wait on labs and may need adjustment to meds.

## 2019-10-12 LAB — PMP SCREEN PROFILE (10S), URINE
Amphetamine Scrn, Ur: NEGATIVE ng/mL
BARBITURATE SCREEN URINE: NEGATIVE ng/mL
BENZODIAZEPINE SCREEN, URINE: NEGATIVE ng/mL
CANNABINOIDS UR QL SCN: NEGATIVE ng/mL
Cocaine (Metab) Scrn, Ur: NEGATIVE ng/mL
Creatinine(Crt), U: 49.5 mg/dL (ref 20.0–300.0)
Methadone Screen, Urine: NEGATIVE ng/mL
OXYCODONE+OXYMORPHONE UR QL SCN: POSITIVE ng/mL — AB
Opiate Scrn, Ur: POSITIVE ng/mL — AB
Ph of Urine: 5.2 (ref 4.5–8.9)
Phencyclidine Qn, Ur: NEGATIVE ng/mL
Propoxyphene Scrn, Ur: NEGATIVE ng/mL

## 2019-10-12 LAB — BRAIN NATRIURETIC PEPTIDE: Pro B Natriuretic peptide (BNP): 55 pg/mL (ref 0.0–100.0)

## 2019-10-12 NOTE — Assessment & Plan Note (Signed)
Fairly stable. Checking BNP, CMP, CBC to evaluate for change given significant change in lasix.

## 2019-10-12 NOTE — Assessment & Plan Note (Signed)
Checked UDS per standing order from PCP. It would appear that most of her prior UDS are abnormal. She does get oxycodone and lorazepam from her PCP and fills regularly. PDMP was reviewed today to help make decision. After results back it was determined that she had inappropriate UDS with positive for oxycodone but negative for benzos. She may need to stop getting controlled substances but will let PCP decide and she is asked to make a visit with PCP to discuss these abnormal results.

## 2019-10-15 DIAGNOSIS — M79672 Pain in left foot: Secondary | ICD-10-CM | POA: Diagnosis not present

## 2019-10-17 ENCOUNTER — Ambulatory Visit (INDEPENDENT_AMBULATORY_CARE_PROVIDER_SITE_OTHER): Payer: Medicare Other | Admitting: Internal Medicine

## 2019-10-17 ENCOUNTER — Ambulatory Visit (INDEPENDENT_AMBULATORY_CARE_PROVIDER_SITE_OTHER)
Admission: RE | Admit: 2019-10-17 | Discharge: 2019-10-17 | Disposition: A | Discharge status: Home / Self Care | Payer: Medicare Other | Source: Ambulatory Visit | Attending: Internal Medicine | Admitting: Internal Medicine

## 2019-10-17 ENCOUNTER — Other Ambulatory Visit: Payer: Self-pay

## 2019-10-17 ENCOUNTER — Encounter: Payer: Self-pay | Admitting: Internal Medicine

## 2019-10-17 VITALS — BP 158/90 | HR 92 | Temp 98.1°F | Ht 62.0 in | Wt 262.0 lb

## 2019-10-17 DIAGNOSIS — S59902A Unspecified injury of left elbow, initial encounter: Secondary | ICD-10-CM | POA: Diagnosis not present

## 2019-10-17 DIAGNOSIS — M25522 Pain in left elbow: Secondary | ICD-10-CM

## 2019-10-17 MED ORDER — REPAGLINIDE 1 MG PO TABS: 1.0000 mg | ORAL_TABLET | Freq: Three times a day (TID) | ORAL | 11 refills | Status: DC

## 2019-10-17 MED ORDER — OXYCODONE HCL 15 MG PO TABS: 15.0000 mg | ORAL_TABLET | Freq: Four times a day (QID) | ORAL | 0 refills | Status: DC | PRN

## 2019-10-17 MED ORDER — OXYCODONE HCL 15 MG PO TABS: 15.0000 mg | ORAL_TABLET | Freq: Four times a day (QID) | ORAL | 0 refills | Status: DC

## 2019-10-17 MED ORDER — TORSEMIDE 100 MG PO TABS: 100.0000 mg | ORAL_TABLET | Freq: Every day | ORAL | 11 refills | Status: DC

## 2019-10-17 MED FILL — REPAGLINIDE 1 MG TABLET: 1 | 30 days supply | Qty: 90 | Fill #0

## 2019-10-17 MED FILL — TORSEMIDE 100 MG TABLET: 100 | 30 days supply | Qty: 30 | Fill #0

## 2019-10-17 NOTE — Patient Instructions (Addendum)
  These suggestions will probably help you to improve your metabolism if you are not overweight and to lose weight if you are overweight: 1.  Reduce your consumption of sugars and starches.  Eliminate high fructose corn syrup from your diet.  Reduce your consumption of processed foods.  For desserts try to have seasonal fruits, berries, nuts, cheeses or dark chocolate with more than 70% cacao. 2.  Do not snack 3.  You do not have to eat breakfast.  If you choose to have breakfast-eat plain greek yogurt, eggs, oatmeal (without sugar) 4.  Drink water, freshly brewed unsweetened tea (green, black or herbal) or coffee.  Do not drink sodas including diet sodas , juices, beverages sweetened with artificial sweeteners. 5.  Reduce your consumption of refined grains. 6.  Avoid protein drinks such as Optifast, Slim fast etc. Eat chicken, fish, meat, dairy and beans for your sources of protein 7.  Natural unprocessed fats like cold pressed virgin olive oil, butter, coconut oil are good for you.  Eat avocados 8.  Increase your consumption of fiber.  Fruits, berries, vegetables, whole grains, flaxseeds, Chia seeds, beans, popcorn, nuts, oatmeal are good sources of fiber 9.  Use vinegar in your diet, i.e. apple cider vinegar, red wine or balsamic vinegar 10.  You can try fasting.  For example you can skip breakfast and lunch every other day (24-hour fast) 11.  Stress reduction, good night sleep, relaxation, meditation, yoga and other physical activity is likely to help you to maintain low weight too. 12.  If you drink alcohol, limit your alcohol intake to no more than 2 drinks a day.   Labs in 4 weeks

## 2019-10-17 NOTE — Progress Notes (Signed)
Subjective:  Patient ID: [name], female    DOB: 16-May-1951  Age: 68 y.o. MRN: [id number]  CC: No chief complaint on file.   HPI [name] presents for chronic LBP, DM C/o falling C/o leg swelling - Dr Clover Mealy increased Lasix to 80 mg bid - not helping  F/u LBP   Outpatient Medications Prior to Visit  Medication Sig Dispense Refill   amoxicillin (AMOXIL) 500 MG capsule TAKE 4 CAPSULES BY MOUTH 1  HOUR PRIOR TO DENTAL  PROCEDURE/CLEANING 16 capsule 1   blood glucose meter kit and supplies Dispense based on patient and insurance preference. Used to check blood sugar daily, DX: E11.9 OneTouch 1 each 0   buPROPion (WELLBUTRIN XL) 300 MG 24 hr tablet TAKE 1 TABLET BY MOUTH  DAILY 90 tablet 3   carvedilol (COREG) 25 MG tablet TAKE 1/2 TABLET BY MOUTH 2 TIMES DAILY WITH A MEAL. 90 tablet 3   chlorhexidine (PERIDEX) 0.12 % solution Use as directed in the mouth or throat 2 (two) times daily.      cyclobenzaprine (FLEXERIL) 10 MG tablet TAKE 3 TABLETS BY MOUTH AT  BEDTIME 270 tablet 1   DULoxetine (CYMBALTA) 60 MG capsule TAKE 1 CAPSULE BY MOUTH AT  BEDTIME 90 capsule 3   furosemide (LASIX) 40 MG tablet Take 1-2 tablets (40-80 mg total) by mouth daily. (Patient taking differently: Take 80 mg by mouth 2 (two) times daily. ) 180 tablet 1   glimepiride (AMARYL) 1 MG tablet TAKE 1 TABLET BY MOUTH  DAILY BEFORE BREAKFAST 90 tablet 3   glucose blood (TRUETEST TEST) test strip Used to check blood sugar daily, DX: E11.9 100 each 3   ketoconazole (NIZORAL) 2 % cream Apply 1 application topically 2 (two) times daily. 90 g 3   ketoconazole (NIZORAL) 200 MG tablet Take 0.5 tablets (100 mg total) by mouth daily. 20 tablet 2   Lancets (ONETOUCH ULTRASOFT) lancets Used to check blood sugar daily, DX: E11.9 100 each 3   lidocaine (XYLOCAINE) 5 % ointment Apply topically 3 (three) times daily as needed. 100 g 3   LORazepam (ATIVAN) 2 MG tablet TAKE 1/2 TO 1 TABLET BY  MOUTH EVERY 6  HOURS AS  NEEDED FOR ANXIETY 90 tablet 1   Multiple Vitamin (MULTIVITAMIN WITH MINERALS) TABS tablet Take 1 tablet by mouth daily.     Omega-3 300 MG CAPS Take 300 mg by mouth at bedtime.     oxyCODONE (ROXICODONE) 15 MG immediate release tablet Take 1 tablet (15 mg total) by mouth every 6 (six) hours as needed for pain. 120 tablet 0   oxyCODONE (ROXICODONE) 15 MG immediate release tablet Take 1 tablet (15 mg total) by mouth 4 (four) times daily. 120 tablet 0   oxyCODONE (ROXICODONE) 15 MG immediate release tablet Take 1 tablet (15 mg total) by mouth 4 (four) times daily. 120 tablet 0   pantoprazole (PROTONIX) 40 MG tablet TAKE 1 TABLET BY MOUTH  DAILY 90 tablet 3   pilocarpine (SALAGEN) 5 MG tablet Take 1 tablet (5 mg total) by mouth 2 (two) times daily. 180 tablet 3   pioglitazone (ACTOS) 15 MG tablet TAKE 1 TABLET BY MOUTH  DAILY 90 tablet 3   polyvinyl alcohol (LIQUIFILM TEARS) 1.4 % ophthalmic solution Place 1 drop into both eyes daily as needed for dry eyes.     PSYLLIUM PO Take 1 capsule by mouth 2 (two) times daily.     solifenacin (VESICARE) 5 MG tablet TAKE 1  TABLET BY MOUTH  DAILY 90 tablet 3   No facility-administered medications prior to visit.     ROS: Review of Systems  Constitutional: Positive for fatigue and unexpected weight change. Negative for activity change, appetite change and chills.  HENT: Negative for congestion, mouth sores and sinus pressure.   Eyes: Negative for visual disturbance.  Respiratory: Negative for cough and chest tightness.   Cardiovascular: Positive for leg swelling.  Gastrointestinal: Negative for abdominal pain and nausea.  Genitourinary: Negative for difficulty urinating, frequency and vaginal pain.  Musculoskeletal: Positive for arthralgias, back pain and gait problem.  Skin: Negative for pallor and rash.  Neurological: Positive for dizziness, weakness and light-headedness. Negative for tremors, numbness and headaches.    Hematological: Bruises/bleeds easily.  Psychiatric/Behavioral: Negative for confusion and sleep disturbance.    Objective:  BP (!) 158/90 (BP Location: Right Arm, Patient Position: Sitting, Cuff Size: Large)    Pulse 92    Temp 98.1 F (36.7 C) (Oral)    Ht '5\' 2"'$  (1.575 m)    Wt 262 lb (118.8 kg)    SpO2 97%    BMI 47.92 kg/m   BP Readings from Last 3 Encounters:  10/17/19 (!) 158/90  10/11/19 138/90  07/17/19 (!) 152/90    Wt Readings from Last 3 Encounters:  10/17/19 262 lb (118.8 kg)  10/11/19 259 lb (117.5 kg)  07/17/19 259 lb (117.5 kg)    Physical Exam Constitutional:      General: She is not in acute distress.    Appearance: She is well-developed. She is obese.  HENT:     Head: Normocephalic.     Right Ear: External ear normal.     Left Ear: External ear normal.     Nose: Nose normal.  Eyes:     General:        Right eye: No discharge.        Left eye: No discharge.     Conjunctiva/sclera: Conjunctivae normal.     Pupils: Pupils are equal, round, and reactive to light.  Neck:     Musculoskeletal: Normal range of motion and neck supple.     Thyroid: No thyromegaly.     Vascular: No JVD.     Trachea: No tracheal deviation.  Cardiovascular:     Rate and Rhythm: Normal rate and regular rhythm.     Heart sounds: Normal heart sounds.  Pulmonary:     Effort: No respiratory distress.     Breath sounds: No stridor. No wheezing.  Abdominal:     General: Bowel sounds are normal. There is no distension.     Palpations: Abdomen is soft. There is no mass.     Tenderness: There is no abdominal tenderness. There is no guarding or rebound.  Musculoskeletal:        General: Tenderness present.     Right lower leg: Edema present.     Left lower leg: Edema present.  Lymphadenopathy:     Cervical: No cervical adenopathy.  Skin:    Findings: Bruising present. No erythema or rash.  Neurological:     Mental Status: She is oriented to person, place, and time.     Cranial  Nerves: No cranial nerve deficit.     Motor: Weakness present. No abnormal muscle tone.     Coordination: Coordination abnormal.     Gait: Gait abnormal.     Deep Tendon Reflexes: Reflexes normal.  Psychiatric:        Behavior: Behavior normal.  Thought Content: Thought content normal.        Judgment: Judgment normal.   trace to 1+  edema B feet L elbow - tender w/hematoma B knees w/bruises Tearful  Lab Results  Component Value Date   WBC 11.8 (H) 10/11/2019   HGB 9.9 (L) 10/11/2019   HCT 30.5 (L) 10/11/2019   PLT 214.0 10/11/2019   GLUCOSE 142 (H) 10/11/2019   CHOL 215 (H) 09/25/2014   TRIG 154.0 (H) 09/25/2014   HDL 46.00 09/25/2014   LDLCALC 138 (H) 09/25/2014   ALT 18 10/11/2019   AST 18 10/11/2019   NA 129 (L) 10/11/2019   K 3.6 10/11/2019   CL 90 (L) 10/11/2019   CREATININE 2.79 (H) 10/11/2019   BUN 61 (H) 10/11/2019   CO2 28 10/11/2019   TSH 1.64 07/25/2013   INR 1.04 02/26/2015   HGBA1C 6.5 07/17/2019    Dg Ribs Unilateral Left  Result Date: 04/24/2018 CLINICAL DATA:  Fall with chest wall pain EXAM: LEFT RIBS - 2 VIEW COMPARISON:  Chest x-ray 03/01/2016 FINDINGS: No pneumothorax or pleural effusion. Atelectasis at the left lung base. Acute displaced left fourth, fifth, sixth, and eighth rib fractures. IMPRESSION: 1. Acute displaced left fourth, fifth, sixth and eighth rib fractures. 2. No definitive left pneumothorax or pleural effusion seen. These results will be called to the ordering clinician or representative by the Radiologist Assistant, and communication documented in the PACS or zVision Dashboard. Electronically Signed   By: Donavan Foil M.D.   On: 04/24/2018 20:59    Assessment & Plan:   There are no diagnoses linked to this encounter.   No orders of the defined types were placed in this encounter.    Follow-up: No follow-ups on file.  [name], MD

## 2019-10-22 DIAGNOSIS — E1129 Type 2 diabetes mellitus with other diabetic kidney complication: Secondary | ICD-10-CM | POA: Diagnosis not present

## 2019-10-22 DIAGNOSIS — N184 Chronic kidney disease, stage 4 (severe): Secondary | ICD-10-CM | POA: Diagnosis not present

## 2019-10-22 DIAGNOSIS — I10 Essential (primary) hypertension: Secondary | ICD-10-CM | POA: Diagnosis not present

## 2019-10-22 DIAGNOSIS — N189 Chronic kidney disease, unspecified: Secondary | ICD-10-CM | POA: Diagnosis not present

## 2019-10-22 DIAGNOSIS — N2581 Secondary hyperparathyroidism of renal origin: Secondary | ICD-10-CM | POA: Diagnosis not present

## 2019-10-22 DIAGNOSIS — D631 Anemia in chronic kidney disease: Secondary | ICD-10-CM | POA: Diagnosis not present

## 2019-10-23 ENCOUNTER — Other Ambulatory Visit: Payer: Self-pay | Admitting: Internal Medicine

## 2019-10-23 NOTE — Telephone Encounter (Signed)
Requested medication (s) are due for refill today: yes  Requested medication (s) are on the active medication list: yes  Last refill: 07/17/2019  #90     1 refill  Future visit scheduled yes  Notes to clinic:Not delegated  Requested Prescriptions  Pending Prescriptions Disp Refills   LORazepam (ATIVAN) 2 MG tablet 90 tablet 1     Not Delegated - Psychiatry:  Anxiolytics/Hypnotics Failed - 10/23/2019  5:09 PM      Failed - This refill cannot be delegated      Failed - Urine Drug Screen completed in last 360 days.      Passed - Valid encounter within last 6 months    Recent Outpatient Visits          6 days ago Left elbow pain   [place], MD   1 week ago Swelling of limb-Bilateral leg Left > than right   [place], Elizabeth A, MD   3 months ago [place], MD   6 months ago Need for hepatitis A and B vaccination   [place], MD   9 months ago Chronic bilateral low back pain without sciatica   [place], MD      Future Appointments            In 1 month Plotnikov, Evie Lacks, MD [place], [place]

## 2019-10-23 NOTE — Telephone Encounter (Signed)
Medication Refill - Medication: LORazepam (ATIVAN) 2 MG tablet [[id number]]     Preferred Pharmacy (with phone number or street name):  [facility], [place] - [address]  [place] [address]  Phone: [phone] Fax: [phone]     Agent: Please be advised that RX refills may take up to 3 business days. We ask that you follow-up with your pharmacy.

## 2019-10-29 MED ORDER — LORAZEPAM 2 MG PO TABS: ORAL_TABLET | ORAL | 1 refills | Status: DC

## 2019-10-30 ENCOUNTER — Other Ambulatory Visit: Payer: Self-pay | Admitting: Internal Medicine

## 2019-11-01 ENCOUNTER — Other Ambulatory Visit: Payer: Self-pay | Admitting: Internal Medicine

## 2019-11-02 MED FILL — oxyCODONE HCL 15 MG TABS: 15 | 30 days supply | Qty: 120 | Fill #0

## 2019-11-09 ENCOUNTER — Other Ambulatory Visit: Payer: Self-pay | Admitting: Internal Medicine

## 2019-11-13 ENCOUNTER — Other Ambulatory Visit: Payer: Self-pay | Admitting: Internal Medicine

## 2019-11-21 MED FILL — TORSEMIDE 100 MG TABLET: 100 | 30 days supply | Qty: 30 | Fill #1

## 2019-11-21 MED FILL — REPAGLINIDE 1 MG TABLET: 1 | 30 days supply | Qty: 90 | Fill #1

## 2019-11-21 MED FILL — KETOCONAZOLE 200 MG TABS: 200 | 40 days supply | Qty: 20 | Fill #2

## 2019-11-22 DIAGNOSIS — D631 Anemia in chronic kidney disease: Secondary | ICD-10-CM | POA: Diagnosis not present

## 2019-11-22 DIAGNOSIS — S93492A Sprain of other ligament of left ankle, initial encounter: Secondary | ICD-10-CM | POA: Diagnosis not present

## 2019-11-22 DIAGNOSIS — N2581 Secondary hyperparathyroidism of renal origin: Secondary | ICD-10-CM | POA: Diagnosis not present

## 2019-11-22 DIAGNOSIS — I129 Hypertensive chronic kidney disease with stage 1 through stage 4 chronic kidney disease, or unspecified chronic kidney disease: Secondary | ICD-10-CM | POA: Diagnosis not present

## 2019-11-22 DIAGNOSIS — M25372 Other instability, left ankle: Secondary | ICD-10-CM | POA: Diagnosis not present

## 2019-11-22 DIAGNOSIS — N184 Chronic kidney disease, stage 4 (severe): Secondary | ICD-10-CM | POA: Diagnosis not present

## 2019-11-22 DIAGNOSIS — E1142 Type 2 diabetes mellitus with diabetic polyneuropathy: Secondary | ICD-10-CM | POA: Diagnosis not present

## 2019-11-22 DIAGNOSIS — E1122 Type 2 diabetes mellitus with diabetic chronic kidney disease: Secondary | ICD-10-CM | POA: Diagnosis not present

## 2019-11-28 ENCOUNTER — Ambulatory Visit (INDEPENDENT_AMBULATORY_CARE_PROVIDER_SITE_OTHER): Payer: Medicare Other | Admitting: Internal Medicine

## 2019-11-28 ENCOUNTER — Other Ambulatory Visit: Payer: Self-pay

## 2019-11-28 ENCOUNTER — Encounter: Payer: Self-pay | Admitting: Internal Medicine

## 2019-11-28 DIAGNOSIS — M545 Low back pain, unspecified: Secondary | ICD-10-CM

## 2019-11-28 DIAGNOSIS — G8929 Other chronic pain: Secondary | ICD-10-CM

## 2019-11-28 DIAGNOSIS — N184 Chronic kidney disease, stage 4 (severe): Secondary | ICD-10-CM

## 2019-11-28 DIAGNOSIS — F411 Generalized anxiety disorder: Secondary | ICD-10-CM

## 2019-11-28 DIAGNOSIS — E119 Type 2 diabetes mellitus without complications: Secondary | ICD-10-CM | POA: Diagnosis not present

## 2019-11-28 DIAGNOSIS — R296 Repeated falls: Secondary | ICD-10-CM

## 2019-11-28 DIAGNOSIS — I10 Essential (primary) hypertension: Secondary | ICD-10-CM

## 2019-11-28 MED ORDER — TORSEMIDE 100 MG PO TABS: 100.0000 mg | ORAL_TABLET | Freq: Every day | ORAL | 3 refills | Status: DC

## 2019-11-28 MED ORDER — GLIMEPIRIDE 1 MG PO TABS: ORAL_TABLET | ORAL | 3 refills | Status: DC

## 2019-11-28 MED ORDER — OXYCODONE HCL 15 MG PO TABS: 15.0000 mg | ORAL_TABLET | Freq: Four times a day (QID) | ORAL | 0 refills | Status: DC | PRN

## 2019-11-28 NOTE — Assessment & Plan Note (Signed)
CKD 4 Dr Clover Mealy

## 2019-11-28 NOTE — Assessment & Plan Note (Signed)
Discussed.

## 2019-11-28 NOTE — Assessment & Plan Note (Signed)
Coreg

## 2019-11-28 NOTE — Assessment & Plan Note (Signed)
Labs

## 2019-11-28 NOTE — Assessment & Plan Note (Signed)
Lorazepam prn  Potential benefits of a long term benzodiazepines  use as well as potential risks  and complications were explained to the patient and were aknowledged. Not to take w/Oxycodone  

## 2019-11-28 NOTE — Progress Notes (Signed)
Subjective:  Patient ID: [name], female    DOB: Mar 27, 1951  Age: 68 y.o. MRN: [id number]  CC: No chief complaint on file.   HPI [name] presents for OA/knees/LBP f/u C/o falls - R knee hurts F/u HTN, DM  Outpatient Medications Prior to Visit  Medication Sig Dispense Refill  . amoxicillin (AMOXIL) 500 MG capsule TAKE 4 CAPSULES BY MOUTH 1  HOUR PRIOR TO DENTAL  PROCEDURE/CLEANING 16 capsule 1  . blood glucose meter kit and supplies Dispense based on patient and insurance preference. Used to check blood sugar daily, DX: E11.9 OneTouch 1 each 0  . buPROPion (WELLBUTRIN XL) 300 MG 24 hr tablet TAKE 1 TABLET BY MOUTH  DAILY 90 tablet 3  . carvedilol (COREG) 25 MG tablet TAKE ONE-HALF TABLET BY  MOUTH TWICE A DAY WITH A  MEAL 90 tablet 3  . chlorhexidine (PERIDEX) 0.12 % solution Use as directed in the mouth or throat 2 (two) times daily.     . cyclobenzaprine (FLEXERIL) 10 MG tablet TAKE 3 TABLETS BY MOUTH AT  BEDTIME 270 tablet 1  . DULoxetine (CYMBALTA) 60 MG capsule TAKE 1 CAPSULE BY MOUTH AT  BEDTIME 90 capsule 3  . glimepiride (AMARYL) 1 MG tablet TAKE 1 TABLET BY MOUTH  DAILY BEFORE BREAKFAST 90 tablet 3  . glucose blood (TRUETEST TEST) test strip Used to check blood sugar daily, DX: E11.9 100 each 3  . ketoconazole (NIZORAL) 2 % cream Apply 1 application topically 2 (two) times daily. 90 g 3  . ketoconazole (NIZORAL) 200 MG tablet Take 0.5 tablets (100 mg total) by mouth daily. 20 tablet 2  . Lancets (ONETOUCH ULTRASOFT) lancets Used to check blood sugar daily, DX: E11.9 100 each 3  . lidocaine (XYLOCAINE) 5 % ointment Apply topically 3 (three) times daily as needed. 100 g 3  . LORazepam (ATIVAN) 2 MG tablet TAKE 1/2 TO 1 TABLET BY  MOUTH EVERY 6 HOURS AS  NEEDED FOR ANXIETY 90 tablet 1  . Multiple Vitamin (MULTIVITAMIN WITH MINERALS) TABS tablet Take 1 tablet by mouth daily.    . Omega-3 300 MG CAPS Take 300 mg by mouth at bedtime.    Marland Kitchen oxyCODONE (ROXICODONE) 15 MG  immediate release tablet Take 1 tablet (15 mg total) by mouth every 6 (six) hours as needed for pain. 120 tablet 0  . oxyCODONE (ROXICODONE) 15 MG immediate release tablet Take 1 tablet (15 mg total) by mouth 4 (four) times daily. 120 tablet 0  . oxyCODONE (ROXICODONE) 15 MG immediate release tablet Take 1 tablet (15 mg total) by mouth 4 (four) times daily. 120 tablet 0  . pantoprazole (PROTONIX) 40 MG tablet TAKE 1 TABLET BY MOUTH  DAILY 90 tablet 3  . pilocarpine (SALAGEN) 5 MG tablet TAKE 1 TABLET BY MOUTH TWO  TIMES DAILY 180 tablet 3  . pioglitazone (ACTOS) 15 MG tablet TAKE 1 TABLET BY MOUTH  DAILY 90 tablet 3  . polyvinyl alcohol (LIQUIFILM TEARS) 1.4 % ophthalmic solution Place 1 drop into both eyes daily as needed for dry eyes.    . PSYLLIUM PO Take 1 capsule by mouth 2 (two) times daily.    . repaglinide (PRANDIN) 1 MG tablet Take 1 tablet (1 mg total) by mouth 3 (three) times daily before meals. 90 tablet 11  . solifenacin (VESICARE) 5 MG tablet TAKE 1 TABLET BY MOUTH  DAILY 90 tablet 3  . torsemide (DEMADEX) 100 MG tablet Take 1 tablet (100 mg total) by  mouth daily. 30 tablet 11   No facility-administered medications prior to visit.    ROS: Review of Systems  Constitutional: Negative for activity change, appetite change, chills, fatigue and unexpected weight change.  HENT: Negative for congestion, mouth sores and sinus pressure.   Eyes: Negative for visual disturbance.  Respiratory: Negative for cough and chest tightness.   Gastrointestinal: Negative for abdominal pain and nausea.  Genitourinary: Negative for difficulty urinating, frequency and vaginal pain.  Musculoskeletal: Positive for arthralgias, back pain and gait problem.  Skin: Negative for pallor and rash.  Neurological: Negative for dizziness, tremors, weakness, numbness and headaches.  Psychiatric/Behavioral: Negative for confusion, sleep disturbance and suicidal ideas.    Objective:  BP (!) 144/86 (BP Location:  Left Arm, Patient Position: Sitting, Cuff Size: Large)   Pulse 78   Temp 98.5 F (36.9 C) (Oral)   Ht _0  (1.575 m)   Wt 261 lb (118.4 kg)   SpO2 98%   BMI 47.74 kg/m   BP Readings from Last 3 Encounters:  11/28/19 (!) 144/86  10/17/19 (!) 158/90  10/11/19 138/90    Wt Readings from Last 3 Encounters:  11/28/19 261 lb (118.4 kg)  10/17/19 262 lb (118.8 kg)  10/11/19 259 lb (117.5 kg)    Physical Exam Constitutional:      General: She is not in acute distress.    Appearance: She is well-developed. She is obese.  HENT:     Head: Normocephalic.     Right Ear: External ear normal.     Left Ear: External ear normal.     Nose: Nose normal.  Eyes:     General:        Right eye: No discharge.        Left eye: No discharge.     Conjunctiva/sclera: Conjunctivae normal.     Pupils: Pupils are equal, round, and reactive to light.  Neck:     Thyroid: No thyromegaly.     Vascular: No JVD.     Trachea: No tracheal deviation.  Cardiovascular:     Rate and Rhythm: Normal rate and regular rhythm.     Heart sounds: Normal heart sounds.  Pulmonary:     Effort: No respiratory distress.     Breath sounds: No stridor. No wheezing.  Abdominal:     General: Bowel sounds are normal. There is no distension.     Palpations: Abdomen is soft. There is no mass.     Tenderness: There is no abdominal tenderness. There is no guarding or rebound.  Musculoskeletal:        General: Tenderness present.     Cervical back: Normal range of motion and neck supple.  Lymphadenopathy:     Cervical: No cervical adenopathy.  Skin:    Findings: No erythema or rash.  Neurological:     Mental Status: She is oriented to person, place, and time.     Cranial Nerves: No cranial nerve deficit.     Motor: No abnormal muscle tone.     Coordination: Coordination abnormal.     Gait: Gait abnormal.     Deep Tendon Reflexes: Reflexes normal.  Psychiatric:        Behavior: Behavior normal.        Thought  Content: Thought content normal.        Judgment: Judgment normal.   R knee w/a bruise, abrasion Pole L ankle in a brace  Lab Results  Component Value Date   WBC 11.8 (H) 10/11/2019  HGB 9.9 (L) 10/11/2019   HCT 30.5 (L) 10/11/2019   PLT 214.0 10/11/2019   GLUCOSE 142 (H) 10/11/2019   CHOL 215 (H) 09/25/2014   TRIG 154.0 (H) 09/25/2014   HDL 46.00 09/25/2014   LDLCALC 138 (H) 09/25/2014   ALT 18 10/11/2019   AST 18 10/11/2019   NA 129 (L) 10/11/2019   K 3.6 10/11/2019   CL 90 (L) 10/11/2019   CREATININE 2.79 (H) 10/11/2019   BUN 61 (H) 10/11/2019   CO2 28 10/11/2019   TSH 1.64 07/25/2013   INR 1.04 02/26/2015   HGBA1C 6.5 07/17/2019    DG Elbow Complete Left  Result Date: 10/18/2019 CLINICAL DATA:  Fall, pain EXAM: LEFT ELBOW - COMPLETE 3+ VIEW COMPARISON:  None. FINDINGS: No fracture or dislocation of the left elbow. Joint spaces are preserved. No elbow joint effusion to suggest radiographically occult fracture. The soft tissues are unremarkable. IMPRESSION: No fracture or dislocation of the left elbow. Joint spaces are preserved. No elbow joint effusion to suggest radiographically occult fracture. Electronically Signed   By: Eddie Candle M.D.   On: 10/18/2019 08:46    Assessment & Plan:    [name], MD

## 2019-11-28 NOTE — Assessment & Plan Note (Signed)
No change in rx  Potential benefits of a long term opioids use as well as potential risks (i.e. addiction risk, apnea etc) and complications (i.e. Somnolence, constipation and others) were explained to the patient and were aknowledged.

## 2019-12-03 MED FILL — oxyCODONE HCL 15 MG TABS: 15 | 30 days supply | Qty: 120 | Fill #0

## 2019-12-09 ENCOUNTER — Other Ambulatory Visit: Payer: Self-pay | Admitting: Internal Medicine

## 2019-12-17 ENCOUNTER — Telehealth: Payer: Self-pay | Admitting: Internal Medicine

## 2019-12-17 DIAGNOSIS — M542 Cervicalgia: Secondary | ICD-10-CM

## 2019-12-17 NOTE — Telephone Encounter (Signed)
Please advise 

## 2019-12-17 NOTE — Telephone Encounter (Signed)
Copied from [place] [id number]. Topic: General - Other >> Dec 17, 2019 12:00 PM Keene Breath wrote: Reason for CRM: Patient is requesting a referral to a specialist that does acupuncture for pain in her neck after her sugery

## 2019-12-18 NOTE — Telephone Encounter (Signed)
Called pt, LVM with details below  

## 2019-12-18 NOTE — Telephone Encounter (Signed)
I don't know who does acupuncture treatments. I don't think insurance pays for it. Thx

## 2019-12-24 ENCOUNTER — Ambulatory Visit: Payer: Self-pay

## 2019-12-24 NOTE — Telephone Encounter (Signed)
Pt calling back this am, stating the pain in her neck/head is worse.  Pt states she would like dr Plotnikov to see her or refer to someone for this pain. Pt states this am she cannot turn her head. Pain is back of neck up to her scalp.  Pt had craniotomy 2003 and she saw Dr Sherwood Gambler at that time.  Pt states she has taken 30 mg oxycodone and it has not touched the pain. I will have NT speak with the pt.

## 2019-12-24 NOTE — Telephone Encounter (Signed)
Pt. Reports she started having neck pain and stiffness last week. Hurts at back of her neck and goes up the back of head."I do not have a headache. It feels muscular, ny neck is so stiff I can't turn my head." Denies any fever or other symptoms. Spoke with Demi in the practice and will forward note for review.  Answer Assessment - Initial Assessment Questions 1. ONSET: "When did the pain begin?"      Started last week 2. LOCATION: "Where does it hurt?"      Base of neck and goes up to crown of head 3. PATTERN "Does the pain come and go, or has it been constant since it started?"      Constant 4. SEVERITY: "How bad is the pain?"  (Scale 1-10; or mild, moderate, severe)   - MILD (1-3): doesn't interfere with normal activities    - MODERATE (4-7): interferes with normal activities or awakens from sleep    - SEVERE (8-10):  excruciating pain, unable to do any normal activities        8-9 5. RADIATION: "Does the pain go anywhere else, shoot into your arms?"     Back of head 6. CORD SYMPTOMS: "Any weakness or numbness of the arms or legs?"     No 7. CAUSE: "What do you think is causing the neck pain?"     Maybe muscular 8. NECK OVERUSE: "Any recent activities that involved turning or twisting the neck?"     No 9. OTHER SYMPTOMS: "Do you have any other symptoms?" (e.g., headache, fever, chest pain, difficulty breathing, neck swelling)     No 10. PREGNANCY: "Is there any chance you are pregnant?" "When was your last menstrual period?"       No  Protocols used: NECK PAIN OR STIFFNESS-A-AH

## 2019-12-25 ENCOUNTER — Telehealth: Payer: Self-pay | Admitting: Internal Medicine

## 2019-12-25 ENCOUNTER — Ambulatory Visit: Payer: Self-pay | Admitting: *Deleted

## 2019-12-25 NOTE — Telephone Encounter (Signed)
Pt called with c/o pain at the base of her head that goes up to the back of her scalp. Having this pain for about 2 weeks. She stated that it feels like the pain she had before when she had to have a craniotomy in 2003. She denies having a fever, neck swelling or shortness of breath or vomiting. She has not injured herself. She is taking her pain medication and it is not helping much. She been tying  to get an appointment with her provider but have not been able to. Per protocol she should be seen within 4 hours. She voiced understanding and will go to the [facility]. She has refused to go to the hospital because of he chronic medical conditions  Routing to [facility] at [facility]  for review.  Reason for Disposition . [1] SEVERE neck pain (e.g., excruciating, unable to do any normal activities) AND [2] not improved after 2 hours of pain medicine  Answer Assessment - Initial Assessment Questions 1. ONSET: "When did the pain begin?"      2 weeks 2. LOCATION: "Where does it hurt?"      Base on head and to the back of her scalp 3. PATTERN "Does the pain come and go, or has it been constant since it started?"      constant 4. SEVERITY: "How bad is the pain?"  (Scale 1-10; or mild, moderate, severe)   - MILD (1-3): doesn't interfere with normal activities    - MODERATE (4-7): interferes with normal activities or awakens from sleep    - SEVERE (8-10):  excruciating pain, unable to do any normal activities      Pain #10 5. RADIATION: "Does the pain go anywhere else, shoot into your arms?"     Goes up to her scalp 6. CORD SYMPTOMS: "Any weakness or numbness of the arms or legs?"     no 7. CAUSE: "What do you think is causing the neck pain?"     Not sure 8. NECK OVERUSE: "Any recent activities that involved turning or twisting the neck?"     no 9. OTHER SYMPTOMS: "Do you have any other symptoms?" (e.g., headache, fever, chest pain, difficulty breathing, neck swelling)     Headache,   10. PREGNANCY: "Is there any chance you are pregnant?" "When was your last menstrual period?"       n/a  Protocols used: NECK PAIN OR STIFFNESS-A-AH

## 2019-12-25 NOTE — Telephone Encounter (Signed)
Patient is calling back to check status once again . She is needing a call back from Dr Alain Marion or his nurse as soon as possible

## 2019-12-25 NOTE — Telephone Encounter (Signed)
I am sorry about the pain.  I put a referral for Dr. Lucretia Kern, [facility]. Thanks

## 2019-12-25 NOTE — Telephone Encounter (Signed)
Please see my other message

## 2019-12-25 NOTE — Telephone Encounter (Signed)
Pt called to check status. Pt states that she is in pain and would like a call back from the office as soon as possible. Please advise

## 2019-12-25 NOTE — Addendum Note (Signed)
Addended by: Cassandria Anger on: 12/25/2019 11:06 PM   Modules accepted: Orders

## 2019-12-26 DIAGNOSIS — M542 Cervicalgia: Secondary | ICD-10-CM | POA: Diagnosis not present

## 2019-12-26 DIAGNOSIS — M47812 Spondylosis without myelopathy or radiculopathy, cervical region: Secondary | ICD-10-CM | POA: Diagnosis not present

## 2019-12-26 DIAGNOSIS — G4489 Other headache syndrome: Secondary | ICD-10-CM | POA: Diagnosis not present

## 2019-12-26 MED FILL — predniSONE 10 MG TABS: 10 | 6 days supply | Qty: 21 | Fill #0

## 2019-12-26 NOTE — Telephone Encounter (Signed)
Patient called back to say that she continue to feel pain in the back of her neck and head, also that she had a slight sore throat on 12/25/19 but no fever states that she will not go to the ER in fear of catching something. Please advise. Ph# [phone]

## 2019-12-26 NOTE — Telephone Encounter (Signed)
LMTCB

## 2019-12-26 NOTE — Telephone Encounter (Signed)
Called and left message for patient today 

## 2020-01-02 MED FILL — oxyCODONE HCL 15 MG TABS: 15 | 30 days supply | Qty: 120 | Fill #0

## 2020-01-07 ENCOUNTER — Telehealth: Payer: Self-pay

## 2020-01-07 NOTE — Telephone Encounter (Signed)
New message  The patient has a trip plan to [place].   Due to COVID & health reasons   The patient needs a letter from Dr. Felicie Morn for an insurance company unable to travel to [place] to get your money back  [place]  [employer].

## 2020-01-07 NOTE — Telephone Encounter (Signed)
Okay.  Please write a letter.  Thanks

## 2020-01-10 ENCOUNTER — Encounter: Payer: Self-pay | Admitting: Internal Medicine

## 2020-01-10 NOTE — Telephone Encounter (Signed)
Called and left message for patient to return call to clinic and let us know if she wanted to come by and pick up note or if she wanted it faxed or mailed.

## 2020-01-10 NOTE — Telephone Encounter (Signed)
Patient will pick up letter at office at her appointment on 01/15/20

## 2020-01-14 DIAGNOSIS — M5030 Other cervical disc degeneration, unspecified cervical region: Secondary | ICD-10-CM | POA: Insufficient documentation

## 2020-01-16 ENCOUNTER — Other Ambulatory Visit: Payer: Self-pay

## 2020-01-16 ENCOUNTER — Encounter: Payer: Self-pay | Admitting: Internal Medicine

## 2020-01-16 ENCOUNTER — Ambulatory Visit (INDEPENDENT_AMBULATORY_CARE_PROVIDER_SITE_OTHER): Payer: Medicare Other | Admitting: Internal Medicine

## 2020-01-16 DIAGNOSIS — M545 Low back pain, unspecified: Secondary | ICD-10-CM

## 2020-01-16 DIAGNOSIS — E119 Type 2 diabetes mellitus without complications: Secondary | ICD-10-CM

## 2020-01-16 DIAGNOSIS — G8929 Other chronic pain: Secondary | ICD-10-CM

## 2020-01-16 DIAGNOSIS — M542 Cervicalgia: Secondary | ICD-10-CM

## 2020-01-16 LAB — BASIC METABOLIC PANEL
BUN: 76 mg/dL — ABNORMAL HIGH (ref 6–23)
CO2: 26 mEq/L (ref 19–32)
Calcium: 9.7 mg/dL (ref 8.4–10.5)
Chloride: 97 mEq/L (ref 96–112)
Creatinine, Ser: 2.97 mg/dL — ABNORMAL HIGH (ref 0.40–1.20)
GFR: 15.7 mL/min — ABNORMAL LOW (ref >60.00)
Glucose, Bld: 143 mg/dL — ABNORMAL HIGH (ref 70–99)
Potassium: 3.9 mEq/L (ref 3.5–5.1)
Sodium: 135 mEq/L (ref 135–145)

## 2020-01-16 LAB — HEMOGLOBIN A1C: Hgb A1c MFr Bld: 7.4 % — ABNORMAL HIGH (ref 4.6–6.5)

## 2020-01-16 MED ORDER — OXYCODONE HCL 15 MG PO TABS: 15.0000 mg | ORAL_TABLET | Freq: Four times a day (QID) | ORAL | 0 refills | Status: DC | PRN

## 2020-01-16 MED ORDER — OXYCODONE HCL 15 MG PO TABS: 15.0000 mg | ORAL_TABLET | Freq: Four times a day (QID) | ORAL | 0 refills | Status: DC

## 2020-01-16 NOTE — Assessment & Plan Note (Signed)
Dr Nelva Bush - epidural inj - pending

## 2020-01-16 NOTE — Progress Notes (Addendum)
Subjective:  Patient ID: [name], female    DOB: 1951/05/02  Age: 69 y.o. MRN: [id number]  CC: No chief complaint on file.   HPI [name] presents for a chronic low back and knee pain, neck pain, diabetes Cervical inj pending  Outpatient Medications Prior to Visit  Medication Sig Dispense Refill   amoxicillin (AMOXIL) 500 MG capsule TAKE 4 CAPSULES BY MOUTH 1  HOUR PRIOR TO DENTAL  PROCEDURE/CLEANING 16 capsule 1   blood glucose meter kit and supplies Dispense based on patient and insurance preference. Used to check blood sugar daily, DX: E11.9 OneTouch 1 each 0   buPROPion (WELLBUTRIN XL) 300 MG 24 hr tablet TAKE 1 TABLET BY MOUTH  DAILY 90 tablet 3   carvedilol (COREG) 25 MG tablet TAKE ONE-HALF TABLET BY  MOUTH TWICE A DAY WITH A  MEAL 90 tablet 3   chlorhexidine (PERIDEX) 0.12 % solution Use as directed in the mouth or throat 2 (two) times daily.      cyclobenzaprine (FLEXERIL) 10 MG tablet TAKE 3 TABLETS BY MOUTH AT  BEDTIME 270 tablet 1   DULoxetine (CYMBALTA) 60 MG capsule TAKE 1 CAPSULE BY MOUTH AT  BEDTIME 90 capsule 3   glimepiride (AMARYL) 1 MG tablet TAKE 1 TABLET BY MOUTH  DAILY BEFORE BREAKFAST 90 tablet 3   glucose blood (TRUETEST TEST) test strip Used to check blood sugar daily, DX: E11.9 100 each 3   ketoconazole (NIZORAL) 2 % cream Apply 1 application topically 2 (two) times daily. 90 g 3   ketoconazole (NIZORAL) 200 MG tablet Take 0.5 tablets (100 mg total) by mouth daily. 20 tablet 2   Lancets (ONETOUCH ULTRASOFT) lancets Used to check blood sugar daily, DX: E11.9 100 each 3   lidocaine (XYLOCAINE) 5 % ointment Apply topically 3 (three) times daily as needed. 100 g 3   LORazepam (ATIVAN) 2 MG tablet TAKE 1/2 TO 1 TABLET BY  MOUTH EVERY 6 HOURS AS  NEEDED FOR ANXIETY 90 tablet 1   Multiple Vitamin (MULTIVITAMIN WITH MINERALS) TABS tablet Take 1 tablet by mouth daily.     Omega-3 300 MG CAPS Take 300 mg by mouth at bedtime.     oxyCODONE (ROXICODONE) 15 MG  immediate release tablet Take 1 tablet (15 mg total) by mouth 4 (four) times daily. 120 tablet 0   oxyCODONE (ROXICODONE) 15 MG immediate release tablet Take 1 tablet (15 mg total) by mouth 4 (four) times daily. 120 tablet 0   oxyCODONE (ROXICODONE) 15 MG immediate release tablet Take 1 tablet (15 mg total) by mouth every 6 (six) hours as needed for pain. 120 tablet 0   pantoprazole (PROTONIX) 40 MG tablet TAKE 1 TABLET BY MOUTH  DAILY 90 tablet 3   pilocarpine (SALAGEN) 5 MG tablet TAKE 1 TABLET BY MOUTH TWO  TIMES DAILY 180 tablet 3   pioglitazone (ACTOS) 15 MG tablet TAKE 1 TABLET BY MOUTH  DAILY 90 tablet 3   polyvinyl alcohol (LIQUIFILM TEARS) 1.4 % ophthalmic solution Place 1 drop into both eyes daily as needed for dry eyes.     PSYLLIUM PO Take 1 capsule by mouth 2 (two) times daily.     solifenacin (VESICARE) 5 MG tablet TAKE 1 TABLET BY MOUTH  DAILY 90 tablet 3   torsemide (DEMADEX) 100 MG tablet Take 1 tablet (100 mg total) by mouth daily. 90 tablet 3   No facility-administered medications prior to visit.    ROS: Review of Systems  Constitutional:  Positive for fatigue. Negative for activity change, appetite change, chills and unexpected weight change.  HENT:  Negative for congestion, mouth sores and sinus pressure.   Eyes:  Negative for visual disturbance.  Respiratory:  Negative for cough and chest tightness.   Gastrointestinal:  Negative for abdominal pain and nausea.  Genitourinary:  Negative for difficulty urinating, frequency and vaginal pain.  Musculoskeletal:  Positive for arthralgias, back pain, gait problem and neck pain.  Skin:  Negative for pallor and rash.  Neurological:  Positive for weakness. Negative for dizziness, tremors, numbness and headaches.  Psychiatric/Behavioral:  Positive for dysphoric mood. Negative for confusion and sleep disturbance. The patient is nervous/anxious.    Objective:  BP 140/78 (BP Location: Left Arm, Patient Position: Sitting, Cuff Size:  Large)   Pulse 99   Temp 98.1 F (36.7 C) (Oral)   Ht _0  (1.575 m)   Wt 260 lb (117.9 kg)   SpO2 92%   BMI 47.55 kg/m   BP Readings from Last 3 Encounters:  01/16/20 140/78  11/28/19 (!) 144/86  10/17/19 (!) 158/90    Wt Readings from Last 3 Encounters:  01/16/20 260 lb (117.9 kg)  11/28/19 261 lb (118.4 kg)  10/17/19 262 lb (118.8 kg)    Physical Exam Constitutional:      General: She is not in acute distress.    Appearance: She is well-developed. She is obese.  HENT:     Head: Normocephalic.     Right Ear: External ear normal.     Left Ear: External ear normal.     Nose: Nose normal.  Eyes:     General:        Right eye: No discharge.        Left eye: No discharge.     Conjunctiva/sclera: Conjunctivae normal.     Pupils: Pupils are equal, round, and reactive to light.  Neck:     Thyroid: No thyromegaly.     Vascular: No JVD.     Trachea: No tracheal deviation.  Cardiovascular:     Rate and Rhythm: Normal rate and regular rhythm.     Heart sounds: Normal heart sounds.  Pulmonary:     Effort: No respiratory distress.     Breath sounds: No stridor. No wheezing.  Abdominal:     General: Bowel sounds are normal. There is no distension.     Palpations: Abdomen is soft. There is no mass.     Tenderness: There is no abdominal tenderness. There is no guarding or rebound.  Musculoskeletal:        General: Tenderness present.     Cervical back: Normal range of motion and neck supple. No rigidity.     Right lower leg: Edema present.     Left lower leg: Edema present.  Lymphadenopathy:     Cervical: No cervical adenopathy.  Skin:    Findings: No erythema or rash.  Neurological:     Cranial Nerves: No cranial nerve deficit.     Motor: No abnormal muscle tone.     Coordination: Coordination normal.     Gait: Gait abnormal.     Deep Tendon Reflexes: Reflexes normal.  Psychiatric:        Behavior: Behavior normal.        Thought Content: Thought content normal.         Judgment: Judgment normal.  Cervical spine, lumbar spine, both knees are painful range of motion Antalgic gait  Lab Results  Component Value Date  WBC 11.8 (H) 10/11/2019   HGB 9.9 (L) 10/11/2019   HCT 30.5 (L) 10/11/2019   PLT 214.0 10/11/2019   GLUCOSE 142 (H) 10/11/2019   CHOL 215 (H) 09/25/2014   TRIG 154.0 (H) 09/25/2014   HDL 46.00 09/25/2014   LDLCALC 138 (H) 09/25/2014   ALT 18 10/11/2019   AST 18 10/11/2019   NA 129 (L) 10/11/2019   K 3.6 10/11/2019   CL 90 (L) 10/11/2019   CREATININE 2.79 (H) 10/11/2019   BUN 61 (H) 10/11/2019   CO2 28 10/11/2019   TSH 1.64 07/25/2013   INR 1.04 02/26/2015   HGBA1C 6.5 07/17/2019    DG Elbow Complete Left  Result Date: 10/18/2019 CLINICAL DATA:  Fall, pain EXAM: LEFT ELBOW - COMPLETE 3+ VIEW COMPARISON:  None. FINDINGS: No fracture or dislocation of the left elbow. Joint spaces are preserved. No elbow joint effusion to suggest radiographically occult fracture. The soft tissues are unremarkable. IMPRESSION: No fracture or dislocation of the left elbow. Joint spaces are preserved. No elbow joint effusion to suggest radiographically occult fracture. Electronically Signed   By: Eddie Candle M.D.   On: 10/18/2019 08:46    Assessment & Plan:      Follow-up: No follow-ups on file.  [name], MD

## 2020-01-16 NOTE — Assessment & Plan Note (Signed)
Roxycodone 15 mg qid  Potential benefits of a long term opioids use as well as potential risks (i.e. addiction risk, apnea etc) and complications (i.e. Somnolence, constipation and others) were explained to the patient and were aknowledged. 

## 2020-01-16 NOTE — Assessment & Plan Note (Signed)
Glimepiride, Actos 

## 2020-01-22 ENCOUNTER — Telehealth: Payer: Self-pay | Admitting: Physician Assistant

## 2020-01-22 NOTE — Telephone Encounter (Signed)
Spoke with the patient. She reports she is having a lot of "gripping and grinding pain" in her abdomen. Sometimes when it hits her, she feels as if she will have diarrhea, but her bowels do not move. Her last bowel movement was yesterday. She states it was a formed bowel movement. She is concerned she is developing microscopic colitis. Reports "strong history" of the same. She is on multiple pain medications. She states she has not taken any laxatives or anti-diarrheals. Agrees to an appointment. 01/30/20 at 10:40 am.

## 2020-01-29 DIAGNOSIS — M5030 Other cervical disc degeneration, unspecified cervical region: Secondary | ICD-10-CM | POA: Diagnosis not present

## 2020-01-30 ENCOUNTER — Other Ambulatory Visit: Payer: Self-pay

## 2020-01-30 ENCOUNTER — Encounter: Payer: Self-pay | Admitting: Internal Medicine

## 2020-01-30 ENCOUNTER — Ambulatory Visit: Payer: Medicare Other | Admitting: Internal Medicine

## 2020-01-30 VITALS — BP 140/68 | HR 64 | Temp 97.7°F | Ht 62.0 in | Wt 258.2 lb

## 2020-01-30 DIAGNOSIS — K219 Gastro-esophageal reflux disease without esophagitis: Secondary | ICD-10-CM

## 2020-01-30 DIAGNOSIS — K52839 Microscopic colitis, unspecified: Secondary | ICD-10-CM

## 2020-01-30 DIAGNOSIS — R1030 Lower abdominal pain, unspecified: Secondary | ICD-10-CM | POA: Diagnosis not present

## 2020-01-30 MED ORDER — DICYCLOMINE HCL 20 MG PO TABS: ORAL_TABLET | ORAL | 2 refills | Status: DC

## 2020-01-30 MED FILL — DICYCLOMINE 20 MG TABLET: 20 | 10 days supply | Qty: 60 | Fill #0

## 2020-01-30 NOTE — Patient Instructions (Signed)
We have sent the following medications to your pharmacy for you to pick up at your convenience:  Bentyl  

## 2020-01-30 NOTE — Progress Notes (Addendum)
HISTORY OF PRESENT ILLNESS:  [name] is a 69 y.o. female, retired endoscopy nurse with multiple medical problems as listed below.  GI issues include a history of microscopic colitis, GERD, and irritable bowel syndrome.  She also has a history of adenomatous colon polyps and has been in a surveillance program.  She was last seen in this office by the GI physician assistant October 13, 2017 regarding diarrhea and abdominal discomfort.  See that dictation for details.  Since that time she has had a declivitous change in her in her health related to progressive neuropathy and a myriad of orthopedic conditions.  She presents today with chief complaint of lower abdominal pain and concerns that maybe she was experiencing a flare of her microscopic colitis.  Patient states that for about 2 weeks she was experiencing cramping lower abdominal discomfort.  She had the sensation that she had to defecate, but did not.  She has had some constipation related to pain medication.  No issues with diarrhea.  No fevers.  Pain has resolved now for over 1 week.  She is tearful throughout the evaluation citing her issues with neuropathy and a myriad of orthopedic ailments as well as renal disease and urinary incontinence.  Prior colonoscopies include 2003, 2009, 2010, and 2016.  In addition to mild sigmoid diverticulosis she has had a diminutive adenoma.  At the time of her last evaluation follow-up in 5 years was recommended.  She does not feel that she is ready for colonoscopy at this time.  I did advise her that there are new guidelines which came out last year which would deem this quite reasonable.  REVIEW OF SYSTEMS:  All non-GI ROS negative unless otherwise stated in the HPI except for anxiety, arthritis, depression, headaches, sleeping problems, excessive urination, urinary leakage  Past Medical History:  Diagnosis Date  . Anemia   . Anxiety   . Colitis 2010   microscopic- Dr Henrene Pastor  . Depression   . Diabetes  mellitus    type II  . GERD (gastroesophageal reflux disease)   . Headache   . Hypertension   . IBS (irritable bowel syndrome)   . LBP (low back pain)   . Neuropathy    feet bilat   . Osteoarthritis   . Osteopenia   . Pneumonia    hx of 2014   . Renal insufficiency 2011  . Sinusitis    currently being treated with antibiotic will complete 03/04/2015    Past Surgical History:  Procedure Laterality Date  . foramen magnum ependymoma surgery  2003   Dr Rita Ohara  . JOINT REPLACEMENT Bilateral   . NASAL SINUS SURGERY     1973   . TONSILLECTOMY    . TOTAL KNEE ARTHROPLASTY     L 2008, R 2009, R 2016- Dr Maureen Ralphs  . TOTAL KNEE REVISION Right 03/05/2015   Procedure: RIGHT TOTAL KNEE ARTHROPLASTY REVISION;  Surgeon: Gaynelle Arabian, MD;  Location: [facility];  Service: Orthopedics;  Laterality: Right;    Social History CJ BEECHER  reports that she quit smoking about 31 years ago. Her smoking use included cigarettes. She has a 20.00 pack-year smoking history. She has never used smokeless tobacco. She reports current alcohol use. She reports that she does not use drugs.  family history includes Cancer in her brother; Crohn's disease in her maternal uncle; Depression in her mother; Diabetes in her brother, brother, father, mother, and another family member; Heart attack in her brother; Heart disease in her  brother and brother; Hyperlipidemia in her brother, brother, and father; Hypertension in her brother, brother, father, and mother; Stroke in her brother; Stroke (age of onset: 3) in her mother.  Allergies  Allergen Reactions  . Erythromycin Diarrhea and Nausea And Vomiting       PHYSICAL EXAMINATION: Vital signs: BP 140/68   Pulse 64   Temp 97.7 F (36.5 C)   Ht '5\' 2"'$  (1.575 m)   Wt 258 lb 3.2 oz (117.1 kg)   BMI 47.23 kg/m   Constitutional: Obese and unhealthy appearing, no acute distress Psychiatric: alert and oriented x3, cooperative.  Depressed Eyes: extraocular movements  intact, anicteric, conjunctiva pink Mouth: Mask Neck: supple no lymphadenopathy Cardiovascular: heart regular rate and rhythm, no murmur Lungs: clear to auscultation bilaterally Abdomen: soft, nontender, nondistended, no obvious ascites, no peritoneal signs, normal bowel sounds, no organomegaly Rectal: Omitted Extremities: no clubbing or cyanosis.  1+ lower extremity edema bilaterally Skin: no lesions on visible extremities Neuro: No focal deficits.  Cranial nerves intact  ASSESSMENT:  1.  Problems with cramping lower abdominal discomfort.  Resolved.  May be related to relative constipation from narcotics.  Could have been a mild bout of diverticulitis or diverticular spasm.  In any event, resolved.  No worrisome features 2.  GERD.  On PPI 3.  History of diminutive adenoma.  Multiple prior colonoscopies 4.  Obesity 5.  History of microscopic colitis 6.  General medical problems  PLAN:  1.  Bowel regimen for constipation.  Reviewed 2.  Prescribe Bentyl as needed for cramping pain.  20 mg every 4-6 hours as needed for pain.  Refills provided. 3.  Should pain recur and persist, she should contact the office.  I would consider CT scan 4.  Defer colonoscopy.  Based on most recent guidelines would not be due for routine examination until 2026 Total of 30 minutes was spent preparing to see the patient, reviewing test, obtaining history, performing physical exam, counseling and educating the patient regarding her above listed issues, ordering medications, and documenting clinical information in the health record.

## 2020-02-04 MED FILL — oxyCODONE HCL 15 MG TABS: 15 | 30 days supply | Qty: 120 | Fill #0

## 2020-02-06 ENCOUNTER — Other Ambulatory Visit: Payer: Self-pay

## 2020-02-06 MED ORDER — LORAZEPAM 2 MG PO TABS: ORAL_TABLET | ORAL | 0 refills | Status: DC

## 2020-02-06 NOTE — Telephone Encounter (Signed)
Medication Requested:   LORazepam (ATIVAN) 2 MG tablet  Is medication on med list:  Yes  (if no, inform pt they may need an appointment)  Is medication a controled: Yes  (yes = last OV with PCP)  -Controlled Substances: Adderall, Ritalin, oxycodone, hydrocodone, methadone, alprazolam, etc  Last visit with PCP: 2.3.21  Is the OV > than 4 months: (yes = schedule an appt if one is not already made)  Pharmacy (Name, [place]):  [facility]

## 2020-02-06 NOTE — Telephone Encounter (Signed)
Pt wants RX sent to local instead of mail order.  Please advise about refill in Dr. Enis Slipper absence.

## 2020-02-11 ENCOUNTER — Telehealth: Payer: Self-pay | Admitting: Internal Medicine

## 2020-02-11 NOTE — Telephone Encounter (Signed)
Ice it OV w/any provider pls Thx

## 2020-02-11 NOTE — Telephone Encounter (Signed)
OV

## 2020-02-11 NOTE — Telephone Encounter (Signed)
New message:    Pt states she received her 2nd vaccination on 02/08/20 and she states she know has a huge knot in the location of her injection and it is very painful. Please advise.

## 2020-02-12 ENCOUNTER — Telehealth: Payer: Self-pay | Admitting: Family

## 2020-02-12 NOTE — Telephone Encounter (Signed)
Pt notified and OV made

## 2020-02-12 NOTE — Telephone Encounter (Signed)
Patient being seen in office tomorrow. I will inform patient in the morning.

## 2020-02-12 NOTE — Telephone Encounter (Signed)
Please let her know that insurance is not going to allow her to get Ativan at local pharmacy; will have to continue with mail order; can discuss with [name] at upcoming visit.

## 2020-02-12 NOTE — Telephone Encounter (Signed)
[name] has already started working on this for the patient.

## 2020-02-13 ENCOUNTER — Other Ambulatory Visit: Payer: Self-pay

## 2020-02-13 ENCOUNTER — Ambulatory Visit (INDEPENDENT_AMBULATORY_CARE_PROVIDER_SITE_OTHER): Payer: Medicare Other | Admitting: Family

## 2020-02-13 ENCOUNTER — Encounter: Payer: Self-pay | Admitting: Family

## 2020-02-13 VITALS — BP 138/80 | HR 80 | Temp 98.0°F | Ht 62.0 in | Wt 250.8 lb

## 2020-02-13 DIAGNOSIS — T50Z95A Adverse effect of other vaccines and biological substances, initial encounter: Secondary | ICD-10-CM | POA: Diagnosis not present

## 2020-02-13 NOTE — Progress Notes (Signed)
[name] is a 69 y.o. female with the following history as recorded in Epic[employer]:  Patient Active Problem List   Diagnosis Date Noted  . Left elbow pain 10/17/2019  . Intertrigo 04/16/2019  . Chest wall contusion, left, initial encounter 04/24/2018  . Falls frequently 03/20/2018  . Neck pain 02/08/2018  . CKD (chronic kidney disease) 10/13/2017  . 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 OA (osteoarthritis) of knee 03/05/2015  . Rib pain on left side 02/27/2015  . URI, acute 09/17/2013  . Swelling of limb-Bilateral leg Left > than right 07/11/2013  . Numbness-Left foot 07/11/2013  . Chronic venous insufficiency 04/19/2013  . Left hand pain 02/09/2013  . Right shoulder pain 09/12/2012  . Edema 04/24/2012  . Grief 11/10/2011  . ABSCESS, TOOTH 02/03/2011  . OSTEOPENIA 10/28/2010  . Fever 08/06/2010  . HYPERKALEMIA 07/15/2010  . ARTHRALGIA 04/08/2010  . Fatigue 04/08/2010  . XEROSTOMIA 02/04/2010  . ECZEMA 10/29/2009  . TOBACCO USE, QUIT 10/29/2009  . Anemia 06/18/2009  . 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 Diarrhea 06/18/2009  . OPACITY, VITREOUS HUMOR 01/15/2009  . COLITIS 10/16/2008  . KNEE PAIN 04/10/2008  . HYPERCHOLESTEROLEMIA 01/10/2008  . Anxiety state 11/08/2007  . Depression 11/08/2007  . Osteoarthritis 11/08/2007  . LOW BACK PAIN 11/08/2007  . Diabetes mellitus type 2, controlled ([place]) 10/07/2007  . Essential hypertension 10/07/2007  . GERD 10/07/2007  . 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 amoxicillin (AMOXIL) 500 MG capsule TAKE 4 CAPSULES BY MOUTH 1  HOUR PRIOR TO DENTAL  PROCEDURE/CLEANING 16 capsule 1  . blood glucose meter kit and supplies Dispense based on patient and insurance preference. Used to check blood sugar daily, DX: E11.9 OneTouch 1 each 0  . buPROPion (WELLBUTRIN XL) 300 MG 24 hr tablet TAKE 1 TABLET BY MOUTH  DAILY 90 tablet 3  .  carvedilol (COREG) 25 MG tablet TAKE ONE-HALF TABLET BY  MOUTH TWICE A DAY WITH A  MEAL 90 tablet 3  . chlorhexidine (PERIDEX) 0.12 % solution Use as directed in the mouth or throat 2 (two) times daily.     . cyclobenzaprine (FLEXERIL) 10 MG tablet TAKE 3 TABLETS BY MOUTH AT  BEDTIME 270 tablet 1  . dicyclomine (BENTYL) 20 MG tablet Take one tablet every 4-6 hours as needed for abdominal pain 60 tablet 2  . DULoxetine (CYMBALTA) 60 MG capsule TAKE 1 CAPSULE BY MOUTH AT  BEDTIME 90 capsule 3  . glimepiride (AMARYL) 1 MG tablet TAKE 1 TABLET BY MOUTH  DAILY BEFORE BREAKFAST 90 tablet 3  . glucose blood (TRUETEST TEST) test strip Used to check blood sugar daily, DX: E11.9 100 each 3  . ketoconazole (NIZORAL) 2 % cream Apply 1 application topically 2 (two) times daily. 90 g 3  . ketoconazole (NIZORAL) 200 MG tablet Take 0.5 tablets (100 mg total) by mouth daily. 20 tablet 2  . Lancets (ONETOUCH ULTRASOFT) lancets Used to check blood sugar daily, DX: E11.9 100 each 3  . lidocaine (XYLOCAINE) 5 % ointment Apply topically 3 (three) times daily as needed. 100 g 3  . LORazepam (ATIVAN) 2 MG tablet TAKE 1/2 TO 1 TABLET BY  MOUTH EVERY 6 HOURS AS  NEEDED FOR ANXIETY 90 tablet 0  . Multiple Vitamin (MULTIVITAMIN WITH MINERALS) TABS tablet Take 1 tablet by mouth daily.    . Omega-3 300 MG CAPS Take 300 mg by mouth at bedtime.    Marland Kitchen  oxyCODONE (ROXICODONE) 15 MG immediate release tablet Take 1 tablet (15 mg total) by mouth 4 (four) times daily. 120 tablet 0  . pantoprazole (PROTONIX) 40 MG tablet TAKE 1 TABLET BY MOUTH  DAILY 90 tablet 3  . pilocarpine (SALAGEN) 5 MG tablet TAKE 1 TABLET BY MOUTH TWO  TIMES DAILY 180 tablet 3  . pioglitazone (ACTOS) 15 MG tablet TAKE 1 TABLET BY MOUTH  DAILY 90 tablet 3  . polyvinyl alcohol (LIQUIFILM TEARS) 1.4 % ophthalmic solution Place 1 drop into both eyes daily as needed for dry eyes.    . PSYLLIUM PO Take 1 capsule by mouth 2 (two) times daily.    . solifenacin (VESICARE)  5 MG tablet TAKE 1 TABLET BY MOUTH  DAILY 90 tablet 3  . torsemide (DEMADEX) 100 MG tablet Take 1 tablet (100 mg total) by mouth daily. 90 tablet 3   No current facility-administered medications for this visit.    Allergies: Erythromycin  Past Medical History:  Diagnosis Date  . Anemia   . Anxiety   . Colitis 2010   microscopic- Dr Henrene Pastor  . Depression   . Diabetes mellitus    type II  . GERD (gastroesophageal reflux disease)   . Headache   . Hypertension   . IBS (irritable bowel syndrome)   . LBP (low back pain)   . Neuropathy    feet bilat   . Osteoarthritis   . Osteopenia   . Pneumonia    hx of 2014   . Renal insufficiency 2011  . Sinusitis    currently being treated with antibiotic will complete 03/04/2015    Past Surgical History:  Procedure Laterality Date  . foramen magnum ependymoma surgery  2003   Dr Rita Ohara  . JOINT REPLACEMENT Bilateral   . NASAL SINUS SURGERY     1973   . TONSILLECTOMY    . TOTAL KNEE ARTHROPLASTY     L 2008, R 2009, R 2016- Dr Maureen Ralphs  . TOTAL KNEE REVISION Right 03/05/2015   Procedure: RIGHT TOTAL KNEE ARTHROPLASTY REVISION;  Surgeon: Gaynelle Arabian, MD;  Location: [facility];  Service: Orthopedics;  Laterality: Right;    Family History  Problem Relation Age of Onset  . Depression Mother   . Hypertension Mother   . Stroke Mother 67  . Diabetes Mother   . Diabetes Father   . Hyperlipidemia Father   . Hypertension Father   . Crohn's disease Maternal Uncle   . Diabetes Other   . Cancer Brother        Prostate  . Diabetes Brother   . Heart disease Brother   . Hyperlipidemia Brother   . Hypertension Brother   . Diabetes Brother   . Heart disease Brother        Heart Disease before age 59  . Heart attack Brother   . Stroke Brother        X's 2  . Hyperlipidemia Brother   . Hypertension Brother   . Colon cancer Neg Hx     Social History   Tobacco Use  . Smoking status: Former Smoker    Packs/day: 1.00    Years: 20.00    Pack  years: 20.00    Types: Cigarettes    Quit date: 12/13/1988    Years since quitting: 31.1  . Smokeless tobacco: Never Used  Substance Use Topics  . Alcohol use: Yes    Alcohol/week: 0.0 standard drinks    Comment: rarely    Subjective:  Had  called earlier this week with concerns for allergic reaction to her 2nd [place] vaccine; localized area of redness/ warmth was very prominent on Monday; symptoms have actually started to improve in the past 24 hours; has been taking Benadryl;   Objective:  Vitals:   02/13/20 1202  BP: 138/80  Pulse: 80  Temp: 98 F (36.7 C)  TempSrc: Oral  SpO2: 97%  Weight: 250 lb 12.8 oz (113.8 kg)  Height: '5\' 2"'$  (1.575 m)    General: Well developed, well nourished, in no acute distress  Skin : Warm and dry. Localized area of erythema at right upper bicep- no warmth or streaking;  Head: Normocephalic and atraumatic  Lungs: Respirations unlabored;  Neurologic: Alert and oriented; speech intact; face symmetrical; moves all extremities well; CNII-XII intact without focal deficit   Assessment:  1. Adverse effect of vaccine, initial encounter     Plan:  Symptoms have improved- almost resolved within the past 24 hours; apply ice and use Benadryl as needed;  Keep planned follow-up with her PCP as scheduled.   This visit occurred during the SARS-CoV-2 public health emergency.  Safety protocols were in place, including screening questions prior to the visit, additional usage of staff PPE, and extensive cleaning of exam room while observing appropriate contact time as indicated for disinfecting solutions.     No follow-ups on file.  No orders of the defined types were placed in this encounter.   Requested Prescriptions    No prescriptions requested or ordered in this encounter

## 2020-02-14 DIAGNOSIS — I129 Hypertensive chronic kidney disease with stage 1 through stage 4 chronic kidney disease, or unspecified chronic kidney disease: Secondary | ICD-10-CM | POA: Diagnosis not present

## 2020-02-14 DIAGNOSIS — D631 Anemia in chronic kidney disease: Secondary | ICD-10-CM | POA: Diagnosis not present

## 2020-02-14 DIAGNOSIS — N2581 Secondary hyperparathyroidism of renal origin: Secondary | ICD-10-CM | POA: Diagnosis not present

## 2020-02-14 DIAGNOSIS — E1122 Type 2 diabetes mellitus with diabetic chronic kidney disease: Secondary | ICD-10-CM | POA: Diagnosis not present

## 2020-02-14 DIAGNOSIS — N184 Chronic kidney disease, stage 4 (severe): Secondary | ICD-10-CM | POA: Diagnosis not present

## 2020-02-15 MED FILL — LORazepam 2 MG TABS: 2 | 23 days supply | Qty: 90 | Fill #0

## 2020-02-15 MED FILL — CALCITRIOL 0.25 MCG CAPS: 0.25 | 30 days supply | Qty: 30 | Fill #0 | Status: TO

## 2020-02-15 MED FILL — CALCITRIOL 0.25 MCG CAPS: 0.25 | 30 days supply | Qty: 30 | Fill #0

## 2020-02-18 ENCOUNTER — Other Ambulatory Visit: Payer: Self-pay | Admitting: Internal Medicine

## 2020-02-20 DIAGNOSIS — M5030 Other cervical disc degeneration, unspecified cervical region: Secondary | ICD-10-CM | POA: Diagnosis not present

## 2020-02-20 DIAGNOSIS — E1140 Type 2 diabetes mellitus with diabetic neuropathy, unspecified: Secondary | ICD-10-CM | POA: Diagnosis not present

## 2020-02-20 MED FILL — GABAPENTIN 100 MG CAPSULE: 100 | 33 days supply | Qty: 90 | Fill #0

## 2020-02-21 DIAGNOSIS — N958 Other specified menopausal and perimenopausal disorders: Secondary | ICD-10-CM | POA: Diagnosis not present

## 2020-02-21 DIAGNOSIS — N2581 Secondary hyperparathyroidism of renal origin: Secondary | ICD-10-CM | POA: Insufficient documentation

## 2020-02-21 DIAGNOSIS — M8588 Other specified disorders of bone density and structure, other site: Secondary | ICD-10-CM | POA: Diagnosis not present

## 2020-02-27 DIAGNOSIS — M542 Cervicalgia: Secondary | ICD-10-CM | POA: Diagnosis not present

## 2020-03-10 MED FILL — oxyCODONE HCL 15 MG TABS: 15 | 30 days supply | Qty: 120 | Fill #0

## 2020-03-19 DIAGNOSIS — M5416 Radiculopathy, lumbar region: Secondary | ICD-10-CM | POA: Diagnosis not present

## 2020-03-27 ENCOUNTER — Telehealth: Payer: Self-pay | Admitting: Internal Medicine

## 2020-03-27 NOTE — Telephone Encounter (Signed)
Pt states her recall date has been adjusted per [name]. Last OV note states pt will be due for colon in 2026, recall adjusted.

## 2020-03-27 NOTE — Telephone Encounter (Signed)
Pt requested a call back to discuss recall colonoscopy.

## 2020-03-29 DIAGNOSIS — M542 Cervicalgia: Secondary | ICD-10-CM | POA: Diagnosis not present

## 2020-03-31 MED FILL — GABAPENTIN 300 MG CAPSULE: 300 | 15 days supply | Qty: 15 | Fill #0

## 2020-04-09 MED FILL — oxyCODONE HCL 15 MG TABS: 15 | 30 days supply | Qty: 120 | Fill #0

## 2020-04-16 ENCOUNTER — Encounter: Payer: Self-pay | Admitting: Internal Medicine

## 2020-04-16 ENCOUNTER — Other Ambulatory Visit: Payer: Self-pay

## 2020-04-16 ENCOUNTER — Ambulatory Visit (INDEPENDENT_AMBULATORY_CARE_PROVIDER_SITE_OTHER): Payer: Medicare Other | Admitting: Internal Medicine

## 2020-04-16 VITALS — BP 130/68 | HR 73 | Temp 98.2°F | Ht 62.0 in | Wt 263.0 lb

## 2020-04-16 DIAGNOSIS — M159 Polyosteoarthritis, unspecified: Secondary | ICD-10-CM

## 2020-04-16 DIAGNOSIS — M150 Primary generalized (osteo)arthritis: Secondary | ICD-10-CM

## 2020-04-16 DIAGNOSIS — M545 Low back pain, unspecified: Secondary | ICD-10-CM

## 2020-04-16 DIAGNOSIS — N184 Chronic kidney disease, stage 4 (severe): Secondary | ICD-10-CM

## 2020-04-16 DIAGNOSIS — W19XXXS Unspecified fall, sequela: Secondary | ICD-10-CM | POA: Diagnosis not present

## 2020-04-16 DIAGNOSIS — M25522 Pain in left elbow: Secondary | ICD-10-CM | POA: Diagnosis not present

## 2020-04-16 DIAGNOSIS — R410 Disorientation, unspecified: Secondary | ICD-10-CM

## 2020-04-16 DIAGNOSIS — M8949 Other hypertrophic osteoarthropathy, multiple sites: Secondary | ICD-10-CM | POA: Diagnosis not present

## 2020-04-16 DIAGNOSIS — G8929 Other chronic pain: Secondary | ICD-10-CM | POA: Diagnosis not present

## 2020-04-16 DIAGNOSIS — E119 Type 2 diabetes mellitus without complications: Secondary | ICD-10-CM | POA: Diagnosis not present

## 2020-04-16 DIAGNOSIS — I10 Essential (primary) hypertension: Secondary | ICD-10-CM

## 2020-04-16 LAB — URINALYSIS
Bilirubin Urine: NEGATIVE
Ketones, ur: NEGATIVE
Leukocytes,Ua: NEGATIVE
Nitrite: NEGATIVE
Specific Gravity, Urine: 1.01 (ref 1.000–1.030)
Total Protein, Urine: NEGATIVE
Urine Glucose: NEGATIVE
Urobilinogen, UA: 0.2 (ref 0.0–1.0)
pH: 5.5 (ref 5.0–8.0)

## 2020-04-16 LAB — BASIC METABOLIC PANEL WITH GFR
BUN: 79 mg/dL — ABNORMAL HIGH (ref 6–23)
CO2: 26 meq/L (ref 19–32)
Calcium: 8.9 mg/dL (ref 8.4–10.5)
Chloride: 93 meq/L — ABNORMAL LOW (ref 96–112)
Creatinine, Ser: 3.92 mg/dL — ABNORMAL HIGH (ref 0.40–1.20)
GFR: 11.39 mL/min — CL
Glucose, Bld: 192 mg/dL — ABNORMAL HIGH (ref 70–99)
Potassium: 4.2 meq/L (ref 3.5–5.1)
Sodium: 130 meq/L — ABNORMAL LOW (ref 135–145)

## 2020-04-16 LAB — CBC WITH DIFFERENTIAL/PLATELET
Basophils Absolute: 0.1 10*3/uL (ref 0.0–0.1)
Basophils Relative: 0.6 % (ref 0.0–3.0)
Eosinophils Absolute: 0.3 10*3/uL (ref 0.0–0.7)
Eosinophils Relative: 2.9 % (ref 0.0–5.0)
HCT: 26.2 % — ABNORMAL LOW (ref 36.0–46.0)
Hemoglobin: 8.8 g/dL — ABNORMAL LOW (ref 12.0–15.0)
Lymphocytes Relative: 13.4 % (ref 12.0–46.0)
Lymphs Abs: 1.3 10*3/uL (ref 0.7–4.0)
MCHC: 33.7 g/dL (ref 30.0–36.0)
MCV: 93.4 fl (ref 78.0–100.0)
Monocytes Absolute: 0.8 10*3/uL (ref 0.1–1.0)
Monocytes Relative: 7.7 % (ref 3.0–12.0)
Neutro Abs: 7.6 10*3/uL (ref 1.4–7.7)
Neutrophils Relative %: 75.4 % (ref 43.0–77.0)
Platelets: 213 10*3/uL (ref 150.0–400.0)
RBC: 2.81 Mil/uL — ABNORMAL LOW (ref 3.87–5.11)
RDW: 13.4 % (ref 11.5–15.5)
WBC: 10 10*3/uL (ref 4.0–10.5)

## 2020-04-16 LAB — HEMOGLOBIN A1C: Hgb A1c MFr Bld: 6.8 % — ABNORMAL HIGH (ref 4.6–6.5)

## 2020-04-16 LAB — HEPATIC FUNCTION PANEL
ALT: 15 U/L (ref 0–35)
AST: 18 U/L (ref 0–37)
Albumin: 3.9 g/dL (ref 3.5–5.2)
Alkaline Phosphatase: 90 U/L (ref 39–117)
Bilirubin, Direct: 0.1 mg/dL (ref 0.0–0.3)
Total Bilirubin: 0.4 mg/dL (ref 0.2–1.2)
Total Protein: 6.7 g/dL (ref 6.0–8.3)

## 2020-04-16 LAB — TSH: TSH: 3.98 u[IU]/mL (ref 0.35–4.50)

## 2020-04-16 LAB — AMMONIA: Ammonia: 28 umol/L (ref 11–35)

## 2020-04-16 MED ORDER — OXYCODONE HCL 15 MG PO TABS: 15.0000 mg | ORAL_TABLET | Freq: Four times a day (QID) | ORAL | 0 refills | Status: DC

## 2020-04-16 MED ORDER — LORAZEPAM 2 MG PO TABS: ORAL_TABLET | ORAL | 0 refills | Status: DC

## 2020-04-16 MED ORDER — AMOXICILLIN 500 MG PO CAPS: ORAL_CAPSULE | ORAL | 1 refills | Status: DC

## 2020-04-16 NOTE — Assessment & Plan Note (Signed)
Severe, chronic Knees and low back Oxy ER/percocet  Potential benefits of a long term opioids use as well as potential risks (i.e. addiction risk, apnea etc) and complications (i.e. Somnolence, constipation and others) were explained to the patient and were aknowledged.

## 2020-04-16 NOTE — Assessment & Plan Note (Signed)
F/u w/Dr Dwyane Dee Cont w/Glimepiride , Actos

## 2020-04-16 NOTE — Assessment & Plan Note (Signed)
CKD 4 F/u w/Dr Azucena Freed

## 2020-04-16 NOTE — Progress Notes (Signed)
Subjective:  Patient ID: [name], female    DOB: 01/20/1951  Age: 69 y.o. MRN: [id number]  CC: No chief complaint on file.   HPI [name] presents for chronic pain, DM, HTN [name] is ere w/her and is c/o confusion. Sister [name] lives in [place]. [name] was "talking out of her head" the other day - "people were in her house, packing her things". Mother did poorly w/Cymbalta in the past - [name] wants to continue... Gabapentin that dr Nelva Bush gave has helped. C/o falling out of bed... It is a complex case  Outpatient Medications Prior to Visit  Medication Sig Dispense Refill  . amoxicillin (AMOXIL) 500 MG capsule TAKE 4 CAPSULES BY MOUTH 1  HOUR PRIOR TO DENTAL  PROCEDURE/CLEANING 16 capsule 1  . blood glucose meter kit and supplies Dispense based on patient and insurance preference. Used to check blood sugar daily, DX: E11.9 OneTouch 1 each 0  . buPROPion (WELLBUTRIN XL) 300 MG 24 hr tablet TAKE 1 TABLET BY MOUTH  DAILY 90 tablet 3  . carvedilol (COREG) 25 MG tablet TAKE ONE-HALF TABLET BY  MOUTH TWICE A DAY WITH A  MEAL 90 tablet 3  . chlorhexidine (PERIDEX) 0.12 % solution Use as directed in the mouth or throat 2 (two) times daily.     . cyclobenzaprine (FLEXERIL) 10 MG tablet TAKE 3 TABLETS BY MOUTH AT  BEDTIME 270 tablet 1  . dicyclomine (BENTYL) 20 MG tablet Take one tablet every 4-6 hours as needed for abdominal pain 60 tablet 2  . DULoxetine (CYMBALTA) 60 MG capsule TAKE 1 CAPSULE BY MOUTH AT  BEDTIME 90 capsule 3  . gabapentin (NEURONTIN) 300 MG capsule Take 300 mg by mouth daily.    Marland Kitchen glimepiride (AMARYL) 1 MG tablet TAKE 1 TABLET BY MOUTH  DAILY BEFORE BREAKFAST 90 tablet 3  . glucose blood (TRUETEST TEST) test strip Used to check blood sugar daily, DX: E11.9 100 each 3  . ketoconazole (NIZORAL) 200 MG tablet Take 0.5 tablets (100 mg total) by mouth daily. 20 tablet 2  . Lancets (ONETOUCH ULTRASOFT) lancets Used to check blood sugar daily, DX: E11.9 100 each 3  .  lidocaine (XYLOCAINE) 5 % ointment Apply topically 3 (three) times daily as needed. 100 g 3  . LORazepam (ATIVAN) 2 MG tablet TAKE 1/2 TO 1 TABLET BY  MOUTH EVERY 6 HOURS AS  NEEDED FOR ANXIETY 90 tablet 0  . Multiple Vitamin (MULTIVITAMIN WITH MINERALS) TABS tablet Take 1 tablet by mouth daily.    . Omega-3 300 MG CAPS Take 300 mg by mouth at bedtime.    Marland Kitchen oxyCODONE (ROXICODONE) 15 MG immediate release tablet Take 1 tablet (15 mg total) by mouth 4 (four) times daily. 120 tablet 0  . pantoprazole (PROTONIX) 40 MG tablet TAKE 1 TABLET BY MOUTH  DAILY 90 tablet 3  . pilocarpine (SALAGEN) 5 MG tablet TAKE 1 TABLET BY MOUTH TWO  TIMES DAILY 180 tablet 3  . pioglitazone (ACTOS) 15 MG tablet TAKE 1 TABLET BY MOUTH  DAILY 90 tablet 3  . polyvinyl alcohol (LIQUIFILM TEARS) 1.4 % ophthalmic solution Place 1 drop into both eyes daily as needed for dry eyes.    . PSYLLIUM PO Take 1 capsule by mouth 2 (two) times daily.    . solifenacin (VESICARE) 5 MG tablet TAKE 1 TABLET BY MOUTH  DAILY 90 tablet 3  . torsemide (DEMADEX) 100 MG tablet Take 1 tablet (100 mg total) by mouth daily. 90 tablet  3   No facility-administered medications prior to visit.    ROS: Review of Systems  Constitutional: Positive for fatigue and unexpected weight change. Negative for activity change, appetite change and chills.  HENT: Negative for congestion, mouth sores and sinus pressure.   Eyes: Negative for visual disturbance.  Respiratory: Negative for cough and chest tightness.   Cardiovascular: Positive for chest pain.  Gastrointestinal: Negative for abdominal pain and nausea.  Genitourinary: Negative for difficulty urinating, frequency and vaginal pain.  Musculoskeletal: Positive for arthralgias, back pain and gait problem.  Skin: Negative for pallor and rash.  Neurological: Positive for weakness. Negative for dizziness, tremors, numbness and headaches.  Psychiatric/Behavioral: Positive for behavioral problems, confusion and  decreased concentration. Negative for sleep disturbance. The patient is nervous/anxious.     Objective:  BP 130/68 (BP Location: Left Arm, Patient Position: Sitting, Cuff Size: Large)   Pulse 73   Temp 98.2 F (36.8 C) (Oral)   Ht '5\' 2"'$  (1.575 m)   Wt 263 lb (119.3 kg)   SpO2 95%   BMI 48.10 kg/m   BP Readings from Last 3 Encounters:  04/16/20 130/68  02/13/20 138/80  01/30/20 140/68    Wt Readings from Last 3 Encounters:  04/16/20 263 lb (119.3 kg)  02/13/20 250 lb 12.8 oz (113.8 kg)  01/30/20 258 lb 3.2 oz (117.1 kg)    Physical Exam Constitutional:      General: She is not in acute distress.    Appearance: She is well-developed.  HENT:     Head: Normocephalic.     Right Ear: External ear normal.     Left Ear: External ear normal.     Nose: Nose normal.  Eyes:     General:        Right eye: No discharge.        Left eye: No discharge.     Conjunctiva/sclera: Conjunctivae normal.     Pupils: Pupils are equal, round, and reactive to light.  Neck:     Thyroid: No thyromegaly.     Vascular: No JVD.     Trachea: No tracheal deviation.  Cardiovascular:     Rate and Rhythm: Normal rate and regular rhythm.     Heart sounds: Normal heart sounds.  Pulmonary:     Effort: No respiratory distress.     Breath sounds: No stridor. No wheezing.  Abdominal:     General: Bowel sounds are normal. There is no distension.     Palpations: Abdomen is soft. There is no mass.     Tenderness: There is no abdominal tenderness. There is no guarding or rebound.  Musculoskeletal:        General: Tenderness present.     Cervical back: Normal range of motion and neck supple.  Lymphadenopathy:     Cervical: No cervical adenopathy.  Skin:    Findings: No erythema or rash.  Neurological:     Cranial Nerves: No cranial nerve deficit.     Motor: No abnormal muscle tone.     Coordination: Coordination abnormal.     Gait: Gait abnormal.     Deep Tendon Reflexes: Reflexes normal.    Psychiatric:        Behavior: Behavior normal.        Thought Content: Thought content normal.        Judgment: Judgment normal.   Legs w/trace to 1+ edema B LS, knees w/pain Cane   FTF >45 min discussing her home situation   Lab Results  Component Value Date   WBC 11.8 (H) 10/11/2019   HGB 9.9 (L) 10/11/2019   HCT 30.5 (L) 10/11/2019   PLT 214.0 10/11/2019   GLUCOSE 143 (H) 01/16/2020   CHOL 215 (H) 09/25/2014   TRIG 154.0 (H) 09/25/2014   HDL 46.00 09/25/2014   LDLCALC 138 (H) 09/25/2014   ALT 18 10/11/2019   AST 18 10/11/2019   NA 135 01/16/2020   K 3.9 01/16/2020   CL 97 01/16/2020   CREATININE 2.97 (H) 01/16/2020   BUN 76 (H) 01/16/2020   CO2 26 01/16/2020   TSH 1.64 07/25/2013   INR 1.04 02/26/2015   HGBA1C 7.4 (H) 01/16/2020    DG Elbow Complete Left  Result Date: 10/18/2019 CLINICAL DATA:  Fall, pain EXAM: LEFT ELBOW - COMPLETE 3+ VIEW COMPARISON:  None. FINDINGS: No fracture or dislocation of the left elbow. Joint spaces are preserved. No elbow joint effusion to suggest radiographically occult fracture. The soft tissues are unremarkable. IMPRESSION: No fracture or dislocation of the left elbow. Joint spaces are preserved. No elbow joint effusion to suggest radiographically occult fracture. Electronically Signed   By: Eddie Candle M.D.   On: 10/18/2019 08:46    Assessment & Plan:    [name], MD

## 2020-04-16 NOTE — Assessment & Plan Note (Signed)
Torsemide, Coreg

## 2020-04-16 NOTE — Assessment & Plan Note (Signed)
Multifactorial - meds, metabolic, etc

## 2020-04-17 ENCOUNTER — Ambulatory Visit: Payer: Medicare Other | Admitting: Internal Medicine

## 2020-04-17 DIAGNOSIS — Z1231 Encounter for screening mammogram for malignant neoplasm of breast: Secondary | ICD-10-CM | POA: Diagnosis not present

## 2020-04-17 LAB — PMP SCREEN PROFILE (10S), URINE
Amphetamine Scrn, Ur: NEGATIVE ng/mL
BARBITURATE SCREEN URINE: NEGATIVE ng/mL
BENZODIAZEPINE SCREEN, URINE: NEGATIVE ng/mL
CANNABINOIDS UR QL SCN: NEGATIVE ng/mL
Cocaine (Metab) Scrn, Ur: NEGATIVE ng/mL
Creatinine(Crt), U: 34.7 mg/dL (ref 20.0–300.0)
Methadone Screen, Urine: NEGATIVE ng/mL
OXYCODONE+OXYMORPHONE UR QL SCN: POSITIVE ng/mL — AB
Opiate Scrn, Ur: POSITIVE ng/mL — AB
Ph of Urine: 5.2 (ref 4.5–8.9)
Phencyclidine Qn, Ur: NEGATIVE ng/mL
Propoxyphene Scrn, Ur: NEGATIVE ng/mL

## 2020-04-21 ENCOUNTER — Other Ambulatory Visit: Payer: Self-pay | Admitting: Obstetrics and Gynecology

## 2020-04-21 DIAGNOSIS — R928 Other abnormal and inconclusive findings on diagnostic imaging of breast: Secondary | ICD-10-CM

## 2020-04-28 DIAGNOSIS — M542 Cervicalgia: Secondary | ICD-10-CM | POA: Diagnosis not present

## 2020-04-29 ENCOUNTER — Other Ambulatory Visit: Payer: Self-pay | Admitting: Obstetrics and Gynecology

## 2020-04-29 ENCOUNTER — Ambulatory Visit
Admission: RE | Admit: 2020-04-29 | Discharge: 2020-04-29 | Disposition: A | Discharge status: Home / Self Care | Payer: Medicare Other | Source: Ambulatory Visit | Attending: Obstetrics and Gynecology | Admitting: Obstetrics and Gynecology

## 2020-04-29 ENCOUNTER — Other Ambulatory Visit: Payer: Self-pay

## 2020-04-29 DIAGNOSIS — D631 Anemia in chronic kidney disease: Secondary | ICD-10-CM | POA: Diagnosis not present

## 2020-04-29 DIAGNOSIS — M150 Primary generalized (osteo)arthritis: Secondary | ICD-10-CM | POA: Diagnosis not present

## 2020-04-29 DIAGNOSIS — E1151 Type 2 diabetes mellitus with diabetic peripheral angiopathy without gangrene: Secondary | ICD-10-CM | POA: Diagnosis not present

## 2020-04-29 DIAGNOSIS — G8929 Other chronic pain: Secondary | ICD-10-CM | POA: Diagnosis not present

## 2020-04-29 DIAGNOSIS — I872 Venous insufficiency (chronic) (peripheral): Secondary | ICD-10-CM | POA: Diagnosis not present

## 2020-04-29 DIAGNOSIS — N641 Fat necrosis of breast: Secondary | ICD-10-CM

## 2020-04-29 DIAGNOSIS — I129 Hypertensive chronic kidney disease with stage 1 through stage 4 chronic kidney disease, or unspecified chronic kidney disease: Secondary | ICD-10-CM | POA: Diagnosis not present

## 2020-04-29 DIAGNOSIS — E1122 Type 2 diabetes mellitus with diabetic chronic kidney disease: Secondary | ICD-10-CM | POA: Diagnosis not present

## 2020-04-29 DIAGNOSIS — E7800 Pure hypercholesterolemia, unspecified: Secondary | ICD-10-CM | POA: Diagnosis not present

## 2020-04-29 DIAGNOSIS — Z7984 Long term (current) use of oral hypoglycemic drugs: Secondary | ICD-10-CM | POA: Diagnosis not present

## 2020-04-29 DIAGNOSIS — R928 Other abnormal and inconclusive findings on diagnostic imaging of breast: Secondary | ICD-10-CM

## 2020-04-29 DIAGNOSIS — N6322 Unspecified lump in the left breast, upper inner quadrant: Secondary | ICD-10-CM | POA: Diagnosis not present

## 2020-04-29 DIAGNOSIS — Z79891 Long term (current) use of opiate analgesic: Secondary | ICD-10-CM | POA: Diagnosis not present

## 2020-04-29 DIAGNOSIS — Z96653 Presence of artificial knee joint, bilateral: Secondary | ICD-10-CM | POA: Diagnosis not present

## 2020-04-29 DIAGNOSIS — M545 Low back pain: Secondary | ICD-10-CM | POA: Diagnosis not present

## 2020-04-29 DIAGNOSIS — Z9181 History of falling: Secondary | ICD-10-CM | POA: Diagnosis not present

## 2020-04-29 DIAGNOSIS — Z87891 Personal history of nicotine dependence: Secondary | ICD-10-CM | POA: Diagnosis not present

## 2020-04-29 DIAGNOSIS — R410 Disorientation, unspecified: Secondary | ICD-10-CM | POA: Diagnosis not present

## 2020-04-29 DIAGNOSIS — N184 Chronic kidney disease, stage 4 (severe): Secondary | ICD-10-CM | POA: Diagnosis not present

## 2020-04-30 ENCOUNTER — Encounter: Payer: Self-pay | Admitting: Internal Medicine

## 2020-04-30 ENCOUNTER — Ambulatory Visit (INDEPENDENT_AMBULATORY_CARE_PROVIDER_SITE_OTHER): Payer: Medicare Other | Admitting: Internal Medicine

## 2020-04-30 DIAGNOSIS — N184 Chronic kidney disease, stage 4 (severe): Secondary | ICD-10-CM | POA: Diagnosis not present

## 2020-04-30 DIAGNOSIS — G8929 Other chronic pain: Secondary | ICD-10-CM

## 2020-04-30 DIAGNOSIS — F332 Major depressive disorder, recurrent severe without psychotic features: Secondary | ICD-10-CM

## 2020-04-30 DIAGNOSIS — I10 Essential (primary) hypertension: Secondary | ICD-10-CM

## 2020-04-30 DIAGNOSIS — R5382 Chronic fatigue, unspecified: Secondary | ICD-10-CM

## 2020-04-30 DIAGNOSIS — M545 Low back pain, unspecified: Secondary | ICD-10-CM

## 2020-04-30 DIAGNOSIS — R296 Repeated falls: Secondary | ICD-10-CM

## 2020-04-30 NOTE — Assessment & Plan Note (Signed)
On Wellbutrin XL, Cymbalta

## 2020-04-30 NOTE — Assessment & Plan Note (Signed)
Stopped falling

## 2020-04-30 NOTE — Assessment & Plan Note (Signed)
On diet cokes 5 bottles a day + water

## 2020-04-30 NOTE — Assessment & Plan Note (Addendum)
No recent falls Meds renewed Gabapentin 100 mg at night is helping

## 2020-04-30 NOTE — Progress Notes (Signed)
Subjective:  Patient ID: [name], female    DOB: [birth year]  Age: 69 y.o. MRN: [id number]  CC: No chief complaint on file.   HPI [name] presents for confusion - resolved, edema - better, depression - better. No hallucinations... F/u CRF  Outpatient Medications Prior to Visit  Medication Sig Dispense Refill  . amoxicillin (AMOXIL) 500 MG capsule TAKE 4 CAPSULES BY MOUTH 1  HOUR PRIOR TO DENTAL  PROCEDURE/CLEANING 16 capsule 1  . blood glucose meter kit and supplies Dispense based on patient and insurance preference. Used to check blood sugar daily, DX: E11.9 OneTouch 1 each 0  . buPROPion (WELLBUTRIN XL) 300 MG 24 hr tablet TAKE 1 TABLET BY MOUTH  DAILY 90 tablet 3  . carvedilol (COREG) 25 MG tablet TAKE ONE-HALF TABLET BY  MOUTH TWICE A DAY WITH A  MEAL 90 tablet 3  . cyclobenzaprine (FLEXERIL) 10 MG tablet TAKE 3 TABLETS BY MOUTH AT  BEDTIME 270 tablet 1  . dicyclomine (BENTYL) 20 MG tablet Take one tablet every 4-6 hours as needed for abdominal pain 60 tablet 2  . DULoxetine (CYMBALTA) 60 MG capsule TAKE 1 CAPSULE BY MOUTH AT  BEDTIME 90 capsule 3  . gabapentin (NEURONTIN) 100 MG capsule Take 100 mg by mouth daily.     Marland Kitchen glimepiride (AMARYL) 1 MG tablet TAKE 1 TABLET BY MOUTH  DAILY BEFORE BREAKFAST 90 tablet 3  . glucose blood (TRUETEST TEST) test strip Used to check blood sugar daily, DX: E11.9 100 each 3  . ketoconazole (NIZORAL) 200 MG tablet Take 0.5 tablets (100 mg total) by mouth daily. 20 tablet 2  . Lancets (ONETOUCH ULTRASOFT) lancets Used to check blood sugar daily, DX: E11.9 100 each 3  . lidocaine (XYLOCAINE) 5 % ointment Apply topically 3 (three) times daily as needed. 100 g 3  . LORazepam (ATIVAN) 2 MG tablet TAKE 1/2 TO 1 TABLET BY  MOUTH EVERY 6 HOURS AS  NEEDED FOR ANXIETY 90 tablet 0  . Multiple Vitamin (MULTIVITAMIN WITH MINERALS) TABS tablet Take 1 tablet by mouth daily.    . Omega-3 300 MG CAPS Take 300 mg by mouth at bedtime.    Marland Kitchen oxyCODONE  (ROXICODONE) 15 MG immediate release tablet Take 1 tablet (15 mg total) by mouth 4 (four) times daily. 120 tablet 0  . pantoprazole (PROTONIX) 40 MG tablet TAKE 1 TABLET BY MOUTH  DAILY 90 tablet 3  . pilocarpine (SALAGEN) 5 MG tablet TAKE 1 TABLET BY MOUTH TWO  TIMES DAILY 180 tablet 3  . pioglitazone (ACTOS) 15 MG tablet TAKE 1 TABLET BY MOUTH  DAILY 90 tablet 3  . polyvinyl alcohol (LIQUIFILM TEARS) 1.4 % ophthalmic solution Place 1 drop into both eyes daily as needed for dry eyes.    . PSYLLIUM PO Take 1 capsule by mouth 2 (two) times daily.    . solifenacin (VESICARE) 5 MG tablet TAKE 1 TABLET BY MOUTH  DAILY 90 tablet 3  . torsemide (DEMADEX) 100 MG tablet Take 1 tablet (100 mg total) by mouth daily. 90 tablet 3  . chlorhexidine (PERIDEX) 0.12 % solution Use as directed in the mouth or throat 2 (two) times daily.      No facility-administered medications prior to visit.    ROS: Review of Systems  Constitutional: Positive for unexpected weight change. Negative for activity change, appetite change, chills and fatigue.  HENT: Negative for congestion, mouth sores and sinus pressure.   Eyes: Negative for visual disturbance.  Respiratory:  Negative for cough and chest tightness.   Gastrointestinal: Negative for abdominal pain and nausea.  Genitourinary: Negative for difficulty urinating, frequency and vaginal pain.  Musculoskeletal: Positive for arthralgias, back pain and gait problem.  Skin: Negative for pallor and rash.  Neurological: Negative for dizziness, tremors, weakness, numbness and headaches.  Psychiatric/Behavioral: Negative for confusion, sleep disturbance and suicidal ideas.    Objective:  BP 120/74 (BP Location: Left Arm, Patient Position: Sitting, Cuff Size: Large)   Pulse 74   Temp 98.5 F (36.9 C) (Oral)   Ht 5' 2" (1.575 m)   Wt 255 lb (115.7 kg)   SpO2 95%   BMI 46.64 kg/m   BP Readings from Last 3 Encounters:  04/30/20 120/74  04/16/20 130/68  02/13/20  138/80    Wt Readings from Last 3 Encounters:  04/30/20 255 lb (115.7 kg)  04/16/20 263 lb (119.3 kg)  02/13/20 250 lb 12.8 oz (113.8 kg)    Physical Exam Constitutional:      General: She is not in acute distress.    Appearance: She is well-developed. She is obese.  HENT:     Head: Normocephalic.     Right Ear: External ear normal.     Left Ear: External ear normal.     Nose: Nose normal.  Eyes:     General:        Right eye: No discharge.        Left eye: No discharge.     Conjunctiva/sclera: Conjunctivae normal.     Pupils: Pupils are equal, round, and reactive to light.  Neck:     Thyroid: No thyromegaly.     Vascular: No JVD.     Trachea: No tracheal deviation.  Cardiovascular:     Rate and Rhythm: Normal rate and regular rhythm.     Heart sounds: Normal heart sounds.  Pulmonary:     Effort: No respiratory distress.     Breath sounds: No stridor. No wheezing.  Abdominal:     General: Bowel sounds are normal. There is no distension.     Palpations: Abdomen is soft. There is no mass.     Tenderness: There is no abdominal tenderness. There is no guarding or rebound.  Musculoskeletal:        General: No tenderness.     Cervical back: Normal range of motion and neck supple.  Lymphadenopathy:     Cervical: No cervical adenopathy.  Skin:    Findings: No erythema or rash.  Neurological:     Mental Status: She is oriented to person, place, and time.     Cranial Nerves: No cranial nerve deficit.     Motor: Weakness present. No abnormal muscle tone.     Coordination: Coordination abnormal.     Gait: Gait abnormal.     Deep Tendon Reflexes: Reflexes normal.  Psychiatric:        Behavior: Behavior normal.        Thought Content: Thought content normal.        Judgment: Judgment normal.     Lab Results  Component Value Date   WBC 10.0 04/16/2020   HGB 8.8 Repeated and verified X2. (L) 04/16/2020   HCT 26.2 (L) 04/16/2020   PLT 213.0 04/16/2020   GLUCOSE 192 (H)  04/16/2020   CHOL 215 (H) 09/25/2014   TRIG 154.0 (H) 09/25/2014   HDL 46.00 09/25/2014   LDLCALC 138 (H) 09/25/2014   ALT 15 04/16/2020   AST 18 04/16/2020   NA 130 (  L) 04/16/2020   K 4.2 04/16/2020   CL 93 (L) 04/16/2020   CREATININE 3.92 (H) 04/16/2020   BUN 79 (H) 04/16/2020   CO2 26 04/16/2020   TSH 3.98 04/16/2020   INR 1.04 02/26/2015   HGBA1C 6.8 (H) 04/16/2020    US BREAST LTD UNI LEFT INC AXILLA  Addendum Date: 04/30/2020   ADDENDUM REPORT: 04/30/2020 11:56 ADDENDUM: For correction of recommendation and BI-RADS category. Recommendation: Left breast diagnostic mammogram and ultrasound in 3 months to reassess probably benign findings, favored to represent fat necrosis. BI-RADS category: 3: Probably benign. Electronically Signed   By: Lovey Newcomer M.D.   On: 04/30/2020 11:56   Result Date: 04/30/2020 CLINICAL DATA:  Patient recalled from screening for left breast asymmetry. EXAM: DIGITAL DIAGNOSTIC LEFT MAMMOGRAM WITH CAD AND TOMO ULTRASOUND LEFT BREAST COMPARISON:  Previous exam(s). ACR Breast Density Category b: There are scattered areas of fibroglandular density. FINDINGS: Spot compression CC tomosynthesis images were obtained demonstrating a persistent asymmetry with suggestion of central fat density. This is not well visualized on the true lateral view. Mammographic images were processed with CAD. On physical exam, no discrete mass is palpated within the upper inner left breast. Targeted ultrasound is performed, showing a 9 x 5 x 12 mm lobular predominantly hyperechoic mass left breast 10 o'clock position 15 cm from nipple felt to correspond with mammographic abnormality. Additionally, within the left breast 11 o'clock position 9 cm from nipple there is a 12 x 6 x 12 mm oval circumscribed hyperechoic mass. IMPRESSION: Asymmetry and corresponding hyperechoic mass within the left breast 10 o'clock position 15 cm from nipple favored to represent an area of fat necrosis. Additional,  likely incidentally identified hyperechoic mass within the left breast 11 o'clock position 9 cm from nipple may represent an area of fat necrosis. RECOMMENDATION: Probably benign findings left breast favored to represent areas of fat necrosis. I have discussed the findings and recommendations with the patient. If applicable, a reminder letter will be sent to the patient regarding the next appointment. BI-RADS CATEGORY  Left breast diagnostic mammogram and ultrasound in 3 months to reassess probably benign findings, favored to represent fat necrosis. Electronically Signed: By: Lovey Newcomer M.D. On: 04/29/2020 15:49   MM DIAG BREAST TOMO UNI LEFT  Addendum Date: 04/30/2020   ADDENDUM REPORT: 04/30/2020 11:56 ADDENDUM: For correction of recommendation and BI-RADS category. Recommendation: Left breast diagnostic mammogram and ultrasound in 3 months to reassess probably benign findings, favored to represent fat necrosis. BI-RADS category: 3: Probably benign. Electronically Signed   By: Lovey Newcomer M.D.   On: 04/30/2020 11:56   Result Date: 04/30/2020 CLINICAL DATA:  Patient recalled from screening for left breast asymmetry. EXAM: DIGITAL DIAGNOSTIC LEFT MAMMOGRAM WITH CAD AND TOMO ULTRASOUND LEFT BREAST COMPARISON:  Previous exam(s). ACR Breast Density Category b: There are scattered areas of fibroglandular density. FINDINGS: Spot compression CC tomosynthesis images were obtained demonstrating a persistent asymmetry with suggestion of central fat density. This is not well visualized on the true lateral view. Mammographic images were processed with CAD. On physical exam, no discrete mass is palpated within the upper inner left breast. Targeted ultrasound is performed, showing a 9 x 5 x 12 mm lobular predominantly hyperechoic mass left breast 10 o'clock position 15 cm from nipple felt to correspond with mammographic abnormality. Additionally, within the left breast 11 o'clock position 9 cm from nipple there is a 12 x  6 x 12 mm oval circumscribed hyperechoic mass. IMPRESSION: Asymmetry and corresponding hyperechoic  mass within the left breast 10 o'clock position 15 cm from nipple favored to represent an area of fat necrosis. Additional, likely incidentally identified hyperechoic mass within the left breast 11 o'clock position 9 cm from nipple may represent an area of fat necrosis. RECOMMENDATION: Probably benign findings left breast favored to represent areas of fat necrosis. I have discussed the findings and recommendations with the patient. If applicable, a reminder letter will be sent to the patient regarding the next appointment. BI-RADS CATEGORY  Left breast diagnostic mammogram and ultrasound in 3 months to reassess probably benign findings, favored to represent fat necrosis. Electronically Signed: By: Lovey Newcomer M.D. On: 04/29/2020 15:49    Assessment & Plan:    [name], MD

## 2020-04-30 NOTE — Assessment & Plan Note (Signed)
On diet cokes 5 bottles a day

## 2020-05-01 ENCOUNTER — Other Ambulatory Visit: Payer: Self-pay | Admitting: Family

## 2020-05-01 ENCOUNTER — Telehealth: Payer: Self-pay

## 2020-05-01 DIAGNOSIS — E1151 Type 2 diabetes mellitus with diabetic peripheral angiopathy without gangrene: Secondary | ICD-10-CM | POA: Diagnosis not present

## 2020-05-01 DIAGNOSIS — Z87891 Personal history of nicotine dependence: Secondary | ICD-10-CM | POA: Diagnosis not present

## 2020-05-01 DIAGNOSIS — M150 Primary generalized (osteo)arthritis: Secondary | ICD-10-CM | POA: Diagnosis not present

## 2020-05-01 DIAGNOSIS — I129 Hypertensive chronic kidney disease with stage 1 through stage 4 chronic kidney disease, or unspecified chronic kidney disease: Secondary | ICD-10-CM | POA: Diagnosis not present

## 2020-05-01 DIAGNOSIS — N184 Chronic kidney disease, stage 4 (severe): Secondary | ICD-10-CM | POA: Diagnosis not present

## 2020-05-01 DIAGNOSIS — M545 Low back pain: Secondary | ICD-10-CM | POA: Diagnosis not present

## 2020-05-01 DIAGNOSIS — Z96653 Presence of artificial knee joint, bilateral: Secondary | ICD-10-CM | POA: Diagnosis not present

## 2020-05-01 DIAGNOSIS — Z7984 Long term (current) use of oral hypoglycemic drugs: Secondary | ICD-10-CM | POA: Diagnosis not present

## 2020-05-01 DIAGNOSIS — I872 Venous insufficiency (chronic) (peripheral): Secondary | ICD-10-CM | POA: Diagnosis not present

## 2020-05-01 DIAGNOSIS — G8929 Other chronic pain: Secondary | ICD-10-CM | POA: Diagnosis not present

## 2020-05-01 DIAGNOSIS — D631 Anemia in chronic kidney disease: Secondary | ICD-10-CM | POA: Diagnosis not present

## 2020-05-01 DIAGNOSIS — Z79891 Long term (current) use of opiate analgesic: Secondary | ICD-10-CM | POA: Diagnosis not present

## 2020-05-01 DIAGNOSIS — Z9181 History of falling: Secondary | ICD-10-CM | POA: Diagnosis not present

## 2020-05-01 DIAGNOSIS — R410 Disorientation, unspecified: Secondary | ICD-10-CM | POA: Diagnosis not present

## 2020-05-01 DIAGNOSIS — E7800 Pure hypercholesterolemia, unspecified: Secondary | ICD-10-CM | POA: Diagnosis not present

## 2020-05-01 DIAGNOSIS — E1122 Type 2 diabetes mellitus with diabetic chronic kidney disease: Secondary | ICD-10-CM | POA: Diagnosis not present

## 2020-05-01 NOTE — Telephone Encounter (Signed)
Home Health Verbal Orders - Caller/Agency: [facility] Number: [phone] (can leave a voicemail) Requesting OT/PT/Skilled Nursing/Social Work/Speech Therapy: PT Frequency:  2 week 5 1 week 2 For strengthening, safe transfers, gait balance training, home exercise program and home safety.

## 2020-05-02 NOTE — Telephone Encounter (Signed)
Left detailed message giving verbal order for below.

## 2020-05-03 ENCOUNTER — Other Ambulatory Visit: Payer: Self-pay | Admitting: Internal Medicine

## 2020-05-06 DIAGNOSIS — M545 Low back pain: Secondary | ICD-10-CM | POA: Diagnosis not present

## 2020-05-06 DIAGNOSIS — Z79891 Long term (current) use of opiate analgesic: Secondary | ICD-10-CM | POA: Diagnosis not present

## 2020-05-06 DIAGNOSIS — Z9181 History of falling: Secondary | ICD-10-CM | POA: Diagnosis not present

## 2020-05-06 DIAGNOSIS — E7800 Pure hypercholesterolemia, unspecified: Secondary | ICD-10-CM | POA: Diagnosis not present

## 2020-05-06 DIAGNOSIS — Z96653 Presence of artificial knee joint, bilateral: Secondary | ICD-10-CM | POA: Diagnosis not present

## 2020-05-06 DIAGNOSIS — I872 Venous insufficiency (chronic) (peripheral): Secondary | ICD-10-CM | POA: Diagnosis not present

## 2020-05-06 DIAGNOSIS — Z7984 Long term (current) use of oral hypoglycemic drugs: Secondary | ICD-10-CM | POA: Diagnosis not present

## 2020-05-06 DIAGNOSIS — E1151 Type 2 diabetes mellitus with diabetic peripheral angiopathy without gangrene: Secondary | ICD-10-CM | POA: Diagnosis not present

## 2020-05-06 DIAGNOSIS — N184 Chronic kidney disease, stage 4 (severe): Secondary | ICD-10-CM | POA: Diagnosis not present

## 2020-05-06 DIAGNOSIS — Z87891 Personal history of nicotine dependence: Secondary | ICD-10-CM | POA: Diagnosis not present

## 2020-05-06 DIAGNOSIS — D631 Anemia in chronic kidney disease: Secondary | ICD-10-CM | POA: Diagnosis not present

## 2020-05-06 DIAGNOSIS — M150 Primary generalized (osteo)arthritis: Secondary | ICD-10-CM | POA: Diagnosis not present

## 2020-05-06 DIAGNOSIS — G8929 Other chronic pain: Secondary | ICD-10-CM | POA: Diagnosis not present

## 2020-05-06 DIAGNOSIS — E1122 Type 2 diabetes mellitus with diabetic chronic kidney disease: Secondary | ICD-10-CM | POA: Diagnosis not present

## 2020-05-06 DIAGNOSIS — I129 Hypertensive chronic kidney disease with stage 1 through stage 4 chronic kidney disease, or unspecified chronic kidney disease: Secondary | ICD-10-CM | POA: Diagnosis not present

## 2020-05-06 DIAGNOSIS — R410 Disorientation, unspecified: Secondary | ICD-10-CM | POA: Diagnosis not present

## 2020-05-07 DIAGNOSIS — E1151 Type 2 diabetes mellitus with diabetic peripheral angiopathy without gangrene: Secondary | ICD-10-CM

## 2020-05-07 DIAGNOSIS — R410 Disorientation, unspecified: Secondary | ICD-10-CM | POA: Diagnosis not present

## 2020-05-07 DIAGNOSIS — N184 Chronic kidney disease, stage 4 (severe): Secondary | ICD-10-CM

## 2020-05-07 DIAGNOSIS — Z96653 Presence of artificial knee joint, bilateral: Secondary | ICD-10-CM

## 2020-05-07 DIAGNOSIS — Z87891 Personal history of nicotine dependence: Secondary | ICD-10-CM

## 2020-05-07 DIAGNOSIS — M150 Primary generalized (osteo)arthritis: Secondary | ICD-10-CM | POA: Diagnosis not present

## 2020-05-07 DIAGNOSIS — E1122 Type 2 diabetes mellitus with diabetic chronic kidney disease: Secondary | ICD-10-CM | POA: Diagnosis not present

## 2020-05-07 DIAGNOSIS — Z7984 Long term (current) use of oral hypoglycemic drugs: Secondary | ICD-10-CM

## 2020-05-07 DIAGNOSIS — E7800 Pure hypercholesterolemia, unspecified: Secondary | ICD-10-CM

## 2020-05-07 DIAGNOSIS — G8929 Other chronic pain: Secondary | ICD-10-CM | POA: Diagnosis not present

## 2020-05-07 DIAGNOSIS — Z9181 History of falling: Secondary | ICD-10-CM

## 2020-05-07 DIAGNOSIS — D631 Anemia in chronic kidney disease: Secondary | ICD-10-CM

## 2020-05-07 DIAGNOSIS — I129 Hypertensive chronic kidney disease with stage 1 through stage 4 chronic kidney disease, or unspecified chronic kidney disease: Secondary | ICD-10-CM

## 2020-05-07 DIAGNOSIS — Z79891 Long term (current) use of opiate analgesic: Secondary | ICD-10-CM

## 2020-05-07 DIAGNOSIS — F411 Generalized anxiety disorder: Secondary | ICD-10-CM

## 2020-05-07 DIAGNOSIS — M545 Low back pain: Secondary | ICD-10-CM | POA: Diagnosis not present

## 2020-05-07 DIAGNOSIS — F329 Major depressive disorder, single episode, unspecified: Secondary | ICD-10-CM

## 2020-05-07 DIAGNOSIS — I872 Venous insufficiency (chronic) (peripheral): Secondary | ICD-10-CM

## 2020-05-08 DIAGNOSIS — G8929 Other chronic pain: Secondary | ICD-10-CM | POA: Diagnosis not present

## 2020-05-08 DIAGNOSIS — Z79891 Long term (current) use of opiate analgesic: Secondary | ICD-10-CM | POA: Diagnosis not present

## 2020-05-08 DIAGNOSIS — E1122 Type 2 diabetes mellitus with diabetic chronic kidney disease: Secondary | ICD-10-CM | POA: Diagnosis not present

## 2020-05-08 DIAGNOSIS — D631 Anemia in chronic kidney disease: Secondary | ICD-10-CM | POA: Diagnosis not present

## 2020-05-08 DIAGNOSIS — E7800 Pure hypercholesterolemia, unspecified: Secondary | ICD-10-CM | POA: Diagnosis not present

## 2020-05-08 DIAGNOSIS — M545 Low back pain: Secondary | ICD-10-CM | POA: Diagnosis not present

## 2020-05-08 DIAGNOSIS — I129 Hypertensive chronic kidney disease with stage 1 through stage 4 chronic kidney disease, or unspecified chronic kidney disease: Secondary | ICD-10-CM | POA: Diagnosis not present

## 2020-05-08 DIAGNOSIS — N184 Chronic kidney disease, stage 4 (severe): Secondary | ICD-10-CM | POA: Diagnosis not present

## 2020-05-08 DIAGNOSIS — M150 Primary generalized (osteo)arthritis: Secondary | ICD-10-CM | POA: Diagnosis not present

## 2020-05-08 DIAGNOSIS — Z7984 Long term (current) use of oral hypoglycemic drugs: Secondary | ICD-10-CM | POA: Diagnosis not present

## 2020-05-08 DIAGNOSIS — Z9181 History of falling: Secondary | ICD-10-CM | POA: Diagnosis not present

## 2020-05-08 DIAGNOSIS — E1151 Type 2 diabetes mellitus with diabetic peripheral angiopathy without gangrene: Secondary | ICD-10-CM | POA: Diagnosis not present

## 2020-05-08 DIAGNOSIS — R410 Disorientation, unspecified: Secondary | ICD-10-CM | POA: Diagnosis not present

## 2020-05-08 DIAGNOSIS — Z96653 Presence of artificial knee joint, bilateral: Secondary | ICD-10-CM | POA: Diagnosis not present

## 2020-05-08 DIAGNOSIS — I872 Venous insufficiency (chronic) (peripheral): Secondary | ICD-10-CM | POA: Diagnosis not present

## 2020-05-08 DIAGNOSIS — Z87891 Personal history of nicotine dependence: Secondary | ICD-10-CM | POA: Diagnosis not present

## 2020-05-09 MED FILL — oxyCODONE HCL 15 MG TABS: 15 | 30 days supply | Qty: 120 | Fill #0

## 2020-05-09 MED FILL — LORazepam 2 MG TABS: 2 | 23 days supply | Qty: 90 | Fill #0

## 2020-05-13 DIAGNOSIS — Z79891 Long term (current) use of opiate analgesic: Secondary | ICD-10-CM | POA: Diagnosis not present

## 2020-05-13 DIAGNOSIS — Z87891 Personal history of nicotine dependence: Secondary | ICD-10-CM | POA: Diagnosis not present

## 2020-05-13 DIAGNOSIS — Z96653 Presence of artificial knee joint, bilateral: Secondary | ICD-10-CM | POA: Diagnosis not present

## 2020-05-13 DIAGNOSIS — I872 Venous insufficiency (chronic) (peripheral): Secondary | ICD-10-CM | POA: Diagnosis not present

## 2020-05-13 DIAGNOSIS — I129 Hypertensive chronic kidney disease with stage 1 through stage 4 chronic kidney disease, or unspecified chronic kidney disease: Secondary | ICD-10-CM | POA: Diagnosis not present

## 2020-05-13 DIAGNOSIS — E7800 Pure hypercholesterolemia, unspecified: Secondary | ICD-10-CM | POA: Diagnosis not present

## 2020-05-13 DIAGNOSIS — Z7984 Long term (current) use of oral hypoglycemic drugs: Secondary | ICD-10-CM | POA: Diagnosis not present

## 2020-05-13 DIAGNOSIS — M545 Low back pain: Secondary | ICD-10-CM | POA: Diagnosis not present

## 2020-05-13 DIAGNOSIS — N184 Chronic kidney disease, stage 4 (severe): Secondary | ICD-10-CM | POA: Diagnosis not present

## 2020-05-13 DIAGNOSIS — R410 Disorientation, unspecified: Secondary | ICD-10-CM | POA: Diagnosis not present

## 2020-05-13 DIAGNOSIS — Z9181 History of falling: Secondary | ICD-10-CM | POA: Diagnosis not present

## 2020-05-13 DIAGNOSIS — D631 Anemia in chronic kidney disease: Secondary | ICD-10-CM | POA: Diagnosis not present

## 2020-05-13 DIAGNOSIS — E1151 Type 2 diabetes mellitus with diabetic peripheral angiopathy without gangrene: Secondary | ICD-10-CM | POA: Diagnosis not present

## 2020-05-13 DIAGNOSIS — G8929 Other chronic pain: Secondary | ICD-10-CM | POA: Diagnosis not present

## 2020-05-13 DIAGNOSIS — E1122 Type 2 diabetes mellitus with diabetic chronic kidney disease: Secondary | ICD-10-CM | POA: Diagnosis not present

## 2020-05-13 DIAGNOSIS — M150 Primary generalized (osteo)arthritis: Secondary | ICD-10-CM | POA: Diagnosis not present

## 2020-05-16 DIAGNOSIS — E1122 Type 2 diabetes mellitus with diabetic chronic kidney disease: Secondary | ICD-10-CM | POA: Diagnosis not present

## 2020-05-16 DIAGNOSIS — D631 Anemia in chronic kidney disease: Secondary | ICD-10-CM | POA: Diagnosis not present

## 2020-05-16 DIAGNOSIS — G8929 Other chronic pain: Secondary | ICD-10-CM | POA: Diagnosis not present

## 2020-05-16 DIAGNOSIS — N184 Chronic kidney disease, stage 4 (severe): Secondary | ICD-10-CM | POA: Diagnosis not present

## 2020-05-16 DIAGNOSIS — M545 Low back pain: Secondary | ICD-10-CM | POA: Diagnosis not present

## 2020-05-16 DIAGNOSIS — Z96653 Presence of artificial knee joint, bilateral: Secondary | ICD-10-CM | POA: Diagnosis not present

## 2020-05-16 DIAGNOSIS — R410 Disorientation, unspecified: Secondary | ICD-10-CM | POA: Diagnosis not present

## 2020-05-16 DIAGNOSIS — M150 Primary generalized (osteo)arthritis: Secondary | ICD-10-CM | POA: Diagnosis not present

## 2020-05-16 DIAGNOSIS — Z79891 Long term (current) use of opiate analgesic: Secondary | ICD-10-CM | POA: Diagnosis not present

## 2020-05-16 DIAGNOSIS — E7800 Pure hypercholesterolemia, unspecified: Secondary | ICD-10-CM | POA: Diagnosis not present

## 2020-05-16 DIAGNOSIS — E1151 Type 2 diabetes mellitus with diabetic peripheral angiopathy without gangrene: Secondary | ICD-10-CM | POA: Diagnosis not present

## 2020-05-16 DIAGNOSIS — Z87891 Personal history of nicotine dependence: Secondary | ICD-10-CM | POA: Diagnosis not present

## 2020-05-16 DIAGNOSIS — Z7984 Long term (current) use of oral hypoglycemic drugs: Secondary | ICD-10-CM | POA: Diagnosis not present

## 2020-05-16 DIAGNOSIS — I872 Venous insufficiency (chronic) (peripheral): Secondary | ICD-10-CM | POA: Diagnosis not present

## 2020-05-16 DIAGNOSIS — I129 Hypertensive chronic kidney disease with stage 1 through stage 4 chronic kidney disease, or unspecified chronic kidney disease: Secondary | ICD-10-CM | POA: Diagnosis not present

## 2020-05-16 DIAGNOSIS — Z9181 History of falling: Secondary | ICD-10-CM | POA: Diagnosis not present

## 2020-05-20 DIAGNOSIS — N184 Chronic kidney disease, stage 4 (severe): Secondary | ICD-10-CM | POA: Diagnosis not present

## 2020-05-20 DIAGNOSIS — E1151 Type 2 diabetes mellitus with diabetic peripheral angiopathy without gangrene: Secondary | ICD-10-CM | POA: Diagnosis not present

## 2020-05-20 DIAGNOSIS — G8929 Other chronic pain: Secondary | ICD-10-CM | POA: Diagnosis not present

## 2020-05-20 DIAGNOSIS — M150 Primary generalized (osteo)arthritis: Secondary | ICD-10-CM | POA: Diagnosis not present

## 2020-05-20 DIAGNOSIS — D631 Anemia in chronic kidney disease: Secondary | ICD-10-CM | POA: Diagnosis not present

## 2020-05-20 DIAGNOSIS — R410 Disorientation, unspecified: Secondary | ICD-10-CM | POA: Diagnosis not present

## 2020-05-20 DIAGNOSIS — M545 Low back pain: Secondary | ICD-10-CM | POA: Diagnosis not present

## 2020-05-20 DIAGNOSIS — I129 Hypertensive chronic kidney disease with stage 1 through stage 4 chronic kidney disease, or unspecified chronic kidney disease: Secondary | ICD-10-CM | POA: Diagnosis not present

## 2020-05-20 DIAGNOSIS — Z87891 Personal history of nicotine dependence: Secondary | ICD-10-CM | POA: Diagnosis not present

## 2020-05-20 DIAGNOSIS — Z7984 Long term (current) use of oral hypoglycemic drugs: Secondary | ICD-10-CM | POA: Diagnosis not present

## 2020-05-20 DIAGNOSIS — E7800 Pure hypercholesterolemia, unspecified: Secondary | ICD-10-CM | POA: Diagnosis not present

## 2020-05-20 DIAGNOSIS — I872 Venous insufficiency (chronic) (peripheral): Secondary | ICD-10-CM | POA: Diagnosis not present

## 2020-05-20 DIAGNOSIS — Z9181 History of falling: Secondary | ICD-10-CM | POA: Diagnosis not present

## 2020-05-20 DIAGNOSIS — Z96653 Presence of artificial knee joint, bilateral: Secondary | ICD-10-CM | POA: Diagnosis not present

## 2020-05-20 DIAGNOSIS — E1122 Type 2 diabetes mellitus with diabetic chronic kidney disease: Secondary | ICD-10-CM | POA: Diagnosis not present

## 2020-05-20 DIAGNOSIS — Z79891 Long term (current) use of opiate analgesic: Secondary | ICD-10-CM | POA: Diagnosis not present

## 2020-05-21 DIAGNOSIS — M818 Other osteoporosis without current pathological fracture: Secondary | ICD-10-CM | POA: Insufficient documentation

## 2020-05-21 DIAGNOSIS — E1122 Type 2 diabetes mellitus with diabetic chronic kidney disease: Secondary | ICD-10-CM | POA: Diagnosis not present

## 2020-05-21 DIAGNOSIS — N184 Chronic kidney disease, stage 4 (severe): Secondary | ICD-10-CM | POA: Insufficient documentation

## 2020-05-22 DIAGNOSIS — Z9181 History of falling: Secondary | ICD-10-CM | POA: Diagnosis not present

## 2020-05-22 DIAGNOSIS — N184 Chronic kidney disease, stage 4 (severe): Secondary | ICD-10-CM | POA: Diagnosis not present

## 2020-05-22 DIAGNOSIS — I129 Hypertensive chronic kidney disease with stage 1 through stage 4 chronic kidney disease, or unspecified chronic kidney disease: Secondary | ICD-10-CM | POA: Diagnosis not present

## 2020-05-22 DIAGNOSIS — I872 Venous insufficiency (chronic) (peripheral): Secondary | ICD-10-CM | POA: Diagnosis not present

## 2020-05-22 DIAGNOSIS — E1122 Type 2 diabetes mellitus with diabetic chronic kidney disease: Secondary | ICD-10-CM | POA: Diagnosis not present

## 2020-05-22 DIAGNOSIS — Z7984 Long term (current) use of oral hypoglycemic drugs: Secondary | ICD-10-CM | POA: Diagnosis not present

## 2020-05-22 DIAGNOSIS — R410 Disorientation, unspecified: Secondary | ICD-10-CM | POA: Diagnosis not present

## 2020-05-22 DIAGNOSIS — Z87891 Personal history of nicotine dependence: Secondary | ICD-10-CM | POA: Diagnosis not present

## 2020-05-22 DIAGNOSIS — D631 Anemia in chronic kidney disease: Secondary | ICD-10-CM | POA: Diagnosis not present

## 2020-05-22 DIAGNOSIS — Z79891 Long term (current) use of opiate analgesic: Secondary | ICD-10-CM | POA: Diagnosis not present

## 2020-05-22 DIAGNOSIS — M150 Primary generalized (osteo)arthritis: Secondary | ICD-10-CM | POA: Diagnosis not present

## 2020-05-22 DIAGNOSIS — Z96653 Presence of artificial knee joint, bilateral: Secondary | ICD-10-CM | POA: Diagnosis not present

## 2020-05-22 DIAGNOSIS — G8929 Other chronic pain: Secondary | ICD-10-CM | POA: Diagnosis not present

## 2020-05-22 DIAGNOSIS — M545 Low back pain: Secondary | ICD-10-CM | POA: Diagnosis not present

## 2020-05-22 DIAGNOSIS — E1151 Type 2 diabetes mellitus with diabetic peripheral angiopathy without gangrene: Secondary | ICD-10-CM | POA: Diagnosis not present

## 2020-05-22 DIAGNOSIS — E7800 Pure hypercholesterolemia, unspecified: Secondary | ICD-10-CM | POA: Diagnosis not present

## 2020-05-26 DIAGNOSIS — Z7984 Long term (current) use of oral hypoglycemic drugs: Secondary | ICD-10-CM | POA: Diagnosis not present

## 2020-05-26 DIAGNOSIS — M545 Low back pain: Secondary | ICD-10-CM | POA: Diagnosis not present

## 2020-05-26 DIAGNOSIS — M150 Primary generalized (osteo)arthritis: Secondary | ICD-10-CM | POA: Diagnosis not present

## 2020-05-26 DIAGNOSIS — D631 Anemia in chronic kidney disease: Secondary | ICD-10-CM | POA: Diagnosis not present

## 2020-05-26 DIAGNOSIS — Z79891 Long term (current) use of opiate analgesic: Secondary | ICD-10-CM | POA: Diagnosis not present

## 2020-05-26 DIAGNOSIS — I872 Venous insufficiency (chronic) (peripheral): Secondary | ICD-10-CM | POA: Diagnosis not present

## 2020-05-26 DIAGNOSIS — Z96653 Presence of artificial knee joint, bilateral: Secondary | ICD-10-CM | POA: Diagnosis not present

## 2020-05-26 DIAGNOSIS — I129 Hypertensive chronic kidney disease with stage 1 through stage 4 chronic kidney disease, or unspecified chronic kidney disease: Secondary | ICD-10-CM | POA: Diagnosis not present

## 2020-05-26 DIAGNOSIS — E7800 Pure hypercholesterolemia, unspecified: Secondary | ICD-10-CM | POA: Diagnosis not present

## 2020-05-26 DIAGNOSIS — N184 Chronic kidney disease, stage 4 (severe): Secondary | ICD-10-CM | POA: Diagnosis not present

## 2020-05-26 DIAGNOSIS — Z9181 History of falling: Secondary | ICD-10-CM | POA: Diagnosis not present

## 2020-05-26 DIAGNOSIS — Z87891 Personal history of nicotine dependence: Secondary | ICD-10-CM | POA: Diagnosis not present

## 2020-05-26 DIAGNOSIS — E1151 Type 2 diabetes mellitus with diabetic peripheral angiopathy without gangrene: Secondary | ICD-10-CM | POA: Diagnosis not present

## 2020-05-26 DIAGNOSIS — G8929 Other chronic pain: Secondary | ICD-10-CM | POA: Diagnosis not present

## 2020-05-26 DIAGNOSIS — R410 Disorientation, unspecified: Secondary | ICD-10-CM | POA: Diagnosis not present

## 2020-05-26 DIAGNOSIS — E1122 Type 2 diabetes mellitus with diabetic chronic kidney disease: Secondary | ICD-10-CM | POA: Diagnosis not present

## 2020-05-27 DIAGNOSIS — N2581 Secondary hyperparathyroidism of renal origin: Secondary | ICD-10-CM | POA: Diagnosis not present

## 2020-05-27 DIAGNOSIS — D631 Anemia in chronic kidney disease: Secondary | ICD-10-CM | POA: Diagnosis not present

## 2020-05-27 DIAGNOSIS — N184 Chronic kidney disease, stage 4 (severe): Secondary | ICD-10-CM | POA: Diagnosis not present

## 2020-05-27 DIAGNOSIS — I129 Hypertensive chronic kidney disease with stage 1 through stage 4 chronic kidney disease, or unspecified chronic kidney disease: Secondary | ICD-10-CM | POA: Diagnosis not present

## 2020-05-27 DIAGNOSIS — E1122 Type 2 diabetes mellitus with diabetic chronic kidney disease: Secondary | ICD-10-CM | POA: Diagnosis not present

## 2020-05-28 ENCOUNTER — Ambulatory Visit (INDEPENDENT_AMBULATORY_CARE_PROVIDER_SITE_OTHER): Payer: Medicare Other | Admitting: Internal Medicine

## 2020-05-28 ENCOUNTER — Encounter: Payer: Self-pay | Admitting: Internal Medicine

## 2020-05-28 ENCOUNTER — Other Ambulatory Visit: Payer: Self-pay

## 2020-05-28 DIAGNOSIS — F411 Generalized anxiety disorder: Secondary | ICD-10-CM

## 2020-05-28 DIAGNOSIS — M545 Low back pain, unspecified: Secondary | ICD-10-CM

## 2020-05-28 DIAGNOSIS — I10 Essential (primary) hypertension: Secondary | ICD-10-CM | POA: Diagnosis not present

## 2020-05-28 DIAGNOSIS — E119 Type 2 diabetes mellitus without complications: Secondary | ICD-10-CM

## 2020-05-28 DIAGNOSIS — G8929 Other chronic pain: Secondary | ICD-10-CM

## 2020-05-28 DIAGNOSIS — R296 Repeated falls: Secondary | ICD-10-CM

## 2020-05-28 MED ORDER — OXYCODONE HCL 15 MG PO TABS: 15.0000 mg | ORAL_TABLET | Freq: Four times a day (QID) | ORAL | 0 refills | Status: DC

## 2020-05-28 MED ORDER — OXYCODONE HCL 15 MG PO TABS: 15.0000 mg | ORAL_TABLET | Freq: Four times a day (QID) | ORAL | 0 refills | Status: DC | PRN

## 2020-05-28 NOTE — Assessment & Plan Note (Signed)
Dr Clover Mealy - HD/PD discussion

## 2020-05-28 NOTE — Assessment & Plan Note (Signed)
Discussed.

## 2020-05-28 NOTE — Progress Notes (Signed)
Subjective:  Patient ID: [name], female    DOB: [birth year]  Age: 69 y.o. MRN: [id number]  CC: No chief complaint on file.   HPI [name] presents for OA, CRF, falls. Pt saw dr Clover Mealy yesterday. Pt fell twice, once OOB.   Outpatient Medications Prior to Visit  Medication Sig Dispense Refill  . amoxicillin (AMOXIL) 500 MG capsule TAKE 4 CAPSULES BY MOUTH 1  HOUR PRIOR TO DENTAL  PROCEDURE/CLEANING 16 capsule 1  . blood glucose meter kit and supplies Dispense based on patient and insurance preference. Used to check blood sugar daily, DX: E11.9 OneTouch 1 each 0  . buPROPion (WELLBUTRIN XL) 300 MG 24 hr tablet TAKE 1 TABLET BY MOUTH  DAILY 90 tablet 3  . carvedilol (COREG) 25 MG tablet TAKE ONE-HALF TABLET BY  MOUTH TWICE A DAY WITH A  MEAL 90 tablet 3  . cyclobenzaprine (FLEXERIL) 10 MG tablet TAKE 3 TABLETS BY MOUTH AT  BEDTIME 270 tablet 1  . dicyclomine (BENTYL) 20 MG tablet Take one tablet every 4-6 hours as needed for abdominal pain 60 tablet 2  . DULoxetine (CYMBALTA) 60 MG capsule TAKE 1 CAPSULE BY MOUTH AT  BEDTIME 90 capsule 3  . FA-B6-B12-Omega 3-Phytosterols (BP VIT 3) 1 MG CAPS omega 3 500 mg-dha-epa-B12 500 mcg-FA 1 mg-B6 12.5 mg-phytosterol cap  Take by oral route.    . gabapentin (NEURONTIN) 100 MG capsule Take 100 mg by mouth daily.     Marland Kitchen glimepiride (AMARYL) 1 MG tablet TAKE 1 TABLET BY MOUTH  DAILY BEFORE BREAKFAST 90 tablet 3  . glucose blood (TRUETEST TEST) test strip Used to check blood sugar daily, DX: E11.9 100 each 3  . ketoconazole (NIZORAL) 200 MG tablet Take 0.5 tablets (100 mg total) by mouth daily. 20 tablet 2  . Lancets (ONETOUCH ULTRASOFT) lancets Used to check blood sugar daily, DX: E11.9 100 each 3  . lidocaine (XYLOCAINE) 5 % ointment Apply topically 3 (three) times daily as needed. 100 g 3  . LORazepam (ATIVAN) 2 MG tablet TAKE 1/2 TO 1 TABLET BY MOUTH EVERY 6 HOURS AS NEEDED FOR ANXIETY 90 tablet 2  . Multiple Vitamin (MULTIVITAMIN WITH  MINERALS) TABS tablet Take 1 tablet by mouth daily.    . Omega-3 300 MG CAPS Take 300 mg by mouth at bedtime.    Marland Kitchen oxyCODONE (ROXICODONE) 15 MG immediate release tablet Take 1 tablet (15 mg total) by mouth 4 (four) times daily. 120 tablet 0  . pantoprazole (PROTONIX) 40 MG tablet TAKE 1 TABLET BY MOUTH  DAILY 90 tablet 3  . pilocarpine (SALAGEN) 5 MG tablet TAKE 1 TABLET BY MOUTH TWO  TIMES DAILY 180 tablet 3  . pioglitazone (ACTOS) 15 MG tablet TAKE 1 TABLET BY MOUTH  DAILY 90 tablet 3  . polyvinyl alcohol (LIQUIFILM TEARS) 1.4 % ophthalmic solution Place 1 drop into both eyes daily as needed for dry eyes.    . PSYLLIUM PO Take 1 capsule by mouth 2 (two) times daily.    . solifenacin (VESICARE) 5 MG tablet TAKE 1 TABLET BY MOUTH  DAILY 90 tablet 3  . torsemide (DEMADEX) 100 MG tablet Take 1 tablet (100 mg total) by mouth daily. 90 tablet 3   No facility-administered medications prior to visit.    ROS: Review of Systems  Constitutional: Positive for fatigue. Negative for activity change, appetite change, chills and unexpected weight change.  HENT: Negative for congestion, mouth sores and sinus pressure.   Eyes: Negative  for visual disturbance.  Respiratory: Negative for cough and chest tightness.   Gastrointestinal: Negative for abdominal pain and nausea.  Genitourinary: Negative for difficulty urinating, frequency and vaginal pain.  Musculoskeletal: Positive for arthralgias, back pain and gait problem.  Skin: Negative for pallor and rash.  Neurological: Positive for weakness. Negative for dizziness, tremors, numbness and headaches.  Hematological: Bruises/bleeds easily.  Psychiatric/Behavioral: Negative for confusion and sleep disturbance. The patient is nervous/anxious.     Objective:  BP 114/62 (BP Location: Right Arm, Patient Position: Sitting, Cuff Size: Large)   Pulse 70   Temp 98.8 F (37.1 C) (Oral)   Ht 5' 2" (1.575 m)   Wt 255 lb (115.7 kg)   SpO2 96%   BMI 46.64 kg/m     BP Readings from Last 3 Encounters:  05/28/20 114/62  04/30/20 120/74  04/16/20 130/68    Wt Readings from Last 3 Encounters:  05/28/20 255 lb (115.7 kg)  04/30/20 255 lb (115.7 kg)  04/16/20 263 lb (119.3 kg)    Physical Exam Constitutional:      General: She is not in acute distress.    Appearance: She is well-developed. She is obese.  HENT:     Head: Normocephalic.     Right Ear: External ear normal.     Left Ear: External ear normal.     Nose: Nose normal.  Eyes:     General:        Right eye: No discharge.        Left eye: No discharge.     Conjunctiva/sclera: Conjunctivae normal.     Pupils: Pupils are equal, round, and reactive to light.  Neck:     Thyroid: No thyromegaly.     Vascular: No JVD.     Trachea: No tracheal deviation.  Cardiovascular:     Rate and Rhythm: Normal rate and regular rhythm.     Heart sounds: Normal heart sounds.  Pulmonary:     Effort: No respiratory distress.     Breath sounds: No stridor. No wheezing.  Abdominal:     General: Bowel sounds are normal. There is no distension.     Palpations: Abdomen is soft. There is no mass.     Tenderness: There is no abdominal tenderness. There is no guarding or rebound.  Musculoskeletal:        General: Swelling and tenderness present.     Cervical back: Normal range of motion and neck supple.     Right lower leg: Edema present.     Left lower leg: Edema present.  Lymphadenopathy:     Cervical: No cervical adenopathy.  Skin:    Findings: Bruising present. No erythema or rash.  Neurological:     Mental Status: She is oriented to person, place, and time.     Cranial Nerves: No cranial nerve deficit.     Motor: Weakness present. No abnormal muscle tone.     Coordination: Coordination abnormal.     Gait: Gait abnormal.     Deep Tendon Reflexes: Reflexes normal.  Psychiatric:        Behavior: Behavior normal.        Thought Content: Thought content normal.        Judgment: Judgment normal.    upper arms w/bruises Support socks are on  Lab Results  Component Value Date   WBC 10.0 04/16/2020   HGB 8.8 Repeated and verified X2. (L) 04/16/2020   HCT 26.2 (L) 04/16/2020   PLT 213.0 04/16/2020  GLUCOSE 192 (H) 04/16/2020   CHOL 215 (H) 09/25/2014   TRIG 154.0 (H) 09/25/2014   HDL 46.00 09/25/2014   LDLCALC 138 (H) 09/25/2014   ALT 15 04/16/2020   AST 18 04/16/2020   NA 130 (L) 04/16/2020   K 4.2 04/16/2020   CL 93 (L) 04/16/2020   CREATININE 3.92 (H) 04/16/2020   BUN 79 (H) 04/16/2020   CO2 26 04/16/2020   TSH 3.98 04/16/2020   INR 1.04 02/26/2015   HGBA1C 6.8 (H) 04/16/2020    US BREAST LTD UNI LEFT INC AXILLA  Addendum Date: 04/30/2020   ADDENDUM REPORT: 04/30/2020 11:56 ADDENDUM: For correction of recommendation and BI-RADS category. Recommendation: Left breast diagnostic mammogram and ultrasound in 3 months to reassess probably benign findings, favored to represent fat necrosis. BI-RADS category: 3: Probably benign. Electronically Signed   By: Lovey Newcomer M.D.   On: 04/30/2020 11:56   Result Date: 04/30/2020 CLINICAL DATA:  Patient recalled from screening for left breast asymmetry. EXAM: DIGITAL DIAGNOSTIC LEFT MAMMOGRAM WITH CAD AND TOMO ULTRASOUND LEFT BREAST COMPARISON:  Previous exam(s). ACR Breast Density Category b: There are scattered areas of fibroglandular density. FINDINGS: Spot compression CC tomosynthesis images were obtained demonstrating a persistent asymmetry with suggestion of central fat density. This is not well visualized on the true lateral view. Mammographic images were processed with CAD. On physical exam, no discrete mass is palpated within the upper inner left breast. Targeted ultrasound is performed, showing a 9 x 5 x 12 mm lobular predominantly hyperechoic mass left breast 10 o'clock position 15 cm from nipple felt to correspond with mammographic abnormality. Additionally, within the left breast 11 o'clock position 9 cm from nipple there is a 12  x 6 x 12 mm oval circumscribed hyperechoic mass. IMPRESSION: Asymmetry and corresponding hyperechoic mass within the left breast 10 o'clock position 15 cm from nipple favored to represent an area of fat necrosis. Additional, likely incidentally identified hyperechoic mass within the left breast 11 o'clock position 9 cm from nipple may represent an area of fat necrosis. RECOMMENDATION: Probably benign findings left breast favored to represent areas of fat necrosis. I have discussed the findings and recommendations with the patient. If applicable, a reminder letter will be sent to the patient regarding the next appointment. BI-RADS CATEGORY  Left breast diagnostic mammogram and ultrasound in 3 months to reassess probably benign findings, favored to represent fat necrosis. Electronically Signed: By: Lovey Newcomer M.D. On: 04/29/2020 15:49   MM DIAG BREAST TOMO UNI LEFT  Addendum Date: 04/30/2020   ADDENDUM REPORT: 04/30/2020 11:56 ADDENDUM: For correction of recommendation and BI-RADS category. Recommendation: Left breast diagnostic mammogram and ultrasound in 3 months to reassess probably benign findings, favored to represent fat necrosis. BI-RADS category: 3: Probably benign. Electronically Signed   By: Lovey Newcomer M.D.   On: 04/30/2020 11:56   Result Date: 04/30/2020 CLINICAL DATA:  Patient recalled from screening for left breast asymmetry. EXAM: DIGITAL DIAGNOSTIC LEFT MAMMOGRAM WITH CAD AND TOMO ULTRASOUND LEFT BREAST COMPARISON:  Previous exam(s). ACR Breast Density Category b: There are scattered areas of fibroglandular density. FINDINGS: Spot compression CC tomosynthesis images were obtained demonstrating a persistent asymmetry with suggestion of central fat density. This is not well visualized on the true lateral view. Mammographic images were processed with CAD. On physical exam, no discrete mass is palpated within the upper inner left breast. Targeted ultrasound is performed, showing a 9 x 5 x 12 mm  lobular predominantly hyperechoic mass left breast 10 o'clock  position 15 cm from nipple felt to correspond with mammographic abnormality. Additionally, within the left breast 11 o'clock position 9 cm from nipple there is a 12 x 6 x 12 mm oval circumscribed hyperechoic mass. IMPRESSION: Asymmetry and corresponding hyperechoic mass within the left breast 10 o'clock position 15 cm from nipple favored to represent an area of fat necrosis. Additional, likely incidentally identified hyperechoic mass within the left breast 11 o'clock position 9 cm from nipple may represent an area of fat necrosis. RECOMMENDATION: Probably benign findings left breast favored to represent areas of fat necrosis. I have discussed the findings and recommendations with the patient. If applicable, a reminder letter will be sent to the patient regarding the next appointment. BI-RADS CATEGORY  Left breast diagnostic mammogram and ultrasound in 3 months to reassess probably benign findings, favored to represent fat necrosis. Electronically Signed: By: Lovey Newcomer M.D. On: 04/29/2020 15:49    Assessment & Plan:   There are no diagnoses linked to this encounter.   No orders of the defined types were placed in this encounter.    Follow-up: No follow-ups on file.  [name], MD

## 2020-05-28 NOTE — Assessment & Plan Note (Signed)
Roxycodone 15 mg qid  Potential benefits of a long term opioids use as well as potential risks (i.e. addiction risk, apnea etc) and complications (i.e. Somnolence, constipation and others) were explained to the patient and were aknowledged. Gabapentin 100 mg at night is helping

## 2020-05-28 NOTE — Assessment & Plan Note (Signed)
F/u Dr Clover Mealy - HD/PD discussion

## 2020-05-28 NOTE — Assessment & Plan Note (Signed)
Lorazepam prn  Potential benefits of a long term benzodiazepines  use as well as potential risks  and complications were explained to the patient and were aknowledged. Not to take w/Oxycodone  

## 2020-05-29 DIAGNOSIS — M542 Cervicalgia: Secondary | ICD-10-CM | POA: Diagnosis not present

## 2020-05-30 DIAGNOSIS — Z87891 Personal history of nicotine dependence: Secondary | ICD-10-CM | POA: Diagnosis not present

## 2020-05-30 DIAGNOSIS — G8929 Other chronic pain: Secondary | ICD-10-CM | POA: Diagnosis not present

## 2020-05-30 DIAGNOSIS — Z7984 Long term (current) use of oral hypoglycemic drugs: Secondary | ICD-10-CM | POA: Diagnosis not present

## 2020-05-30 DIAGNOSIS — E7800 Pure hypercholesterolemia, unspecified: Secondary | ICD-10-CM | POA: Diagnosis not present

## 2020-05-30 DIAGNOSIS — M150 Primary generalized (osteo)arthritis: Secondary | ICD-10-CM | POA: Diagnosis not present

## 2020-05-30 DIAGNOSIS — D631 Anemia in chronic kidney disease: Secondary | ICD-10-CM | POA: Diagnosis not present

## 2020-05-30 DIAGNOSIS — N184 Chronic kidney disease, stage 4 (severe): Secondary | ICD-10-CM | POA: Diagnosis not present

## 2020-05-30 DIAGNOSIS — I872 Venous insufficiency (chronic) (peripheral): Secondary | ICD-10-CM | POA: Diagnosis not present

## 2020-05-30 DIAGNOSIS — Z96653 Presence of artificial knee joint, bilateral: Secondary | ICD-10-CM | POA: Diagnosis not present

## 2020-05-30 DIAGNOSIS — R410 Disorientation, unspecified: Secondary | ICD-10-CM | POA: Diagnosis not present

## 2020-05-30 DIAGNOSIS — E1151 Type 2 diabetes mellitus with diabetic peripheral angiopathy without gangrene: Secondary | ICD-10-CM | POA: Diagnosis not present

## 2020-05-30 DIAGNOSIS — Z79891 Long term (current) use of opiate analgesic: Secondary | ICD-10-CM | POA: Diagnosis not present

## 2020-05-30 DIAGNOSIS — Z9181 History of falling: Secondary | ICD-10-CM | POA: Diagnosis not present

## 2020-05-30 DIAGNOSIS — E1122 Type 2 diabetes mellitus with diabetic chronic kidney disease: Secondary | ICD-10-CM | POA: Diagnosis not present

## 2020-05-30 DIAGNOSIS — M545 Low back pain: Secondary | ICD-10-CM | POA: Diagnosis not present

## 2020-05-30 DIAGNOSIS — I129 Hypertensive chronic kidney disease with stage 1 through stage 4 chronic kidney disease, or unspecified chronic kidney disease: Secondary | ICD-10-CM | POA: Diagnosis not present

## 2020-06-02 DIAGNOSIS — M545 Low back pain: Secondary | ICD-10-CM | POA: Diagnosis not present

## 2020-06-02 DIAGNOSIS — Z7984 Long term (current) use of oral hypoglycemic drugs: Secondary | ICD-10-CM | POA: Diagnosis not present

## 2020-06-02 DIAGNOSIS — E1122 Type 2 diabetes mellitus with diabetic chronic kidney disease: Secondary | ICD-10-CM | POA: Diagnosis not present

## 2020-06-02 DIAGNOSIS — Z79891 Long term (current) use of opiate analgesic: Secondary | ICD-10-CM | POA: Diagnosis not present

## 2020-06-02 DIAGNOSIS — E7800 Pure hypercholesterolemia, unspecified: Secondary | ICD-10-CM | POA: Diagnosis not present

## 2020-06-02 DIAGNOSIS — G8929 Other chronic pain: Secondary | ICD-10-CM | POA: Diagnosis not present

## 2020-06-02 DIAGNOSIS — M150 Primary generalized (osteo)arthritis: Secondary | ICD-10-CM | POA: Diagnosis not present

## 2020-06-02 DIAGNOSIS — Z96653 Presence of artificial knee joint, bilateral: Secondary | ICD-10-CM | POA: Diagnosis not present

## 2020-06-02 DIAGNOSIS — Z9181 History of falling: Secondary | ICD-10-CM | POA: Diagnosis not present

## 2020-06-02 DIAGNOSIS — I129 Hypertensive chronic kidney disease with stage 1 through stage 4 chronic kidney disease, or unspecified chronic kidney disease: Secondary | ICD-10-CM | POA: Diagnosis not present

## 2020-06-02 DIAGNOSIS — E1151 Type 2 diabetes mellitus with diabetic peripheral angiopathy without gangrene: Secondary | ICD-10-CM | POA: Diagnosis not present

## 2020-06-02 DIAGNOSIS — Z87891 Personal history of nicotine dependence: Secondary | ICD-10-CM | POA: Diagnosis not present

## 2020-06-02 DIAGNOSIS — R410 Disorientation, unspecified: Secondary | ICD-10-CM | POA: Diagnosis not present

## 2020-06-02 DIAGNOSIS — N184 Chronic kidney disease, stage 4 (severe): Secondary | ICD-10-CM | POA: Diagnosis not present

## 2020-06-02 DIAGNOSIS — I872 Venous insufficiency (chronic) (peripheral): Secondary | ICD-10-CM | POA: Diagnosis not present

## 2020-06-02 DIAGNOSIS — D631 Anemia in chronic kidney disease: Secondary | ICD-10-CM | POA: Diagnosis not present

## 2020-06-09 ENCOUNTER — Other Ambulatory Visit (HOSPITAL_COMMUNITY): Payer: Self-pay | Admitting: *Deleted

## 2020-06-09 MED FILL — oxyCODONE HCL 15 MG TABS: 15 | 30 days supply | Qty: 120 | Fill #0

## 2020-06-10 ENCOUNTER — Encounter (HOSPITAL_COMMUNITY): Payer: Medicare Other

## 2020-06-11 DIAGNOSIS — Z79891 Long term (current) use of opiate analgesic: Secondary | ICD-10-CM | POA: Diagnosis not present

## 2020-06-11 DIAGNOSIS — M150 Primary generalized (osteo)arthritis: Secondary | ICD-10-CM | POA: Diagnosis not present

## 2020-06-11 DIAGNOSIS — R410 Disorientation, unspecified: Secondary | ICD-10-CM | POA: Diagnosis not present

## 2020-06-11 DIAGNOSIS — I129 Hypertensive chronic kidney disease with stage 1 through stage 4 chronic kidney disease, or unspecified chronic kidney disease: Secondary | ICD-10-CM | POA: Diagnosis not present

## 2020-06-11 DIAGNOSIS — G8929 Other chronic pain: Secondary | ICD-10-CM | POA: Diagnosis not present

## 2020-06-11 DIAGNOSIS — I872 Venous insufficiency (chronic) (peripheral): Secondary | ICD-10-CM | POA: Diagnosis not present

## 2020-06-11 DIAGNOSIS — E1122 Type 2 diabetes mellitus with diabetic chronic kidney disease: Secondary | ICD-10-CM | POA: Diagnosis not present

## 2020-06-11 DIAGNOSIS — Z7984 Long term (current) use of oral hypoglycemic drugs: Secondary | ICD-10-CM | POA: Diagnosis not present

## 2020-06-11 DIAGNOSIS — N184 Chronic kidney disease, stage 4 (severe): Secondary | ICD-10-CM | POA: Diagnosis not present

## 2020-06-11 DIAGNOSIS — M545 Low back pain: Secondary | ICD-10-CM | POA: Diagnosis not present

## 2020-06-11 DIAGNOSIS — E1151 Type 2 diabetes mellitus with diabetic peripheral angiopathy without gangrene: Secondary | ICD-10-CM | POA: Diagnosis not present

## 2020-06-11 DIAGNOSIS — Z9181 History of falling: Secondary | ICD-10-CM | POA: Diagnosis not present

## 2020-06-11 DIAGNOSIS — D631 Anemia in chronic kidney disease: Secondary | ICD-10-CM | POA: Diagnosis not present

## 2020-06-11 DIAGNOSIS — Z96653 Presence of artificial knee joint, bilateral: Secondary | ICD-10-CM | POA: Diagnosis not present

## 2020-06-11 DIAGNOSIS — Z87891 Personal history of nicotine dependence: Secondary | ICD-10-CM | POA: Diagnosis not present

## 2020-06-11 DIAGNOSIS — E7800 Pure hypercholesterolemia, unspecified: Secondary | ICD-10-CM | POA: Diagnosis not present

## 2020-06-13 ENCOUNTER — Other Ambulatory Visit (HOSPITAL_COMMUNITY): Payer: Self-pay

## 2020-06-17 ENCOUNTER — Encounter (HOSPITAL_COMMUNITY)
Admission: RE | Admit: 2020-06-17 | Discharge: 2020-06-17 | Disposition: A | Discharge status: Home / Self Care | Payer: Medicare Other | Source: Ambulatory Visit | Attending: Nephrology | Admitting: Nephrology

## 2020-06-17 ENCOUNTER — Encounter (HOSPITAL_COMMUNITY): Payer: Medicare Other

## 2020-06-17 ENCOUNTER — Other Ambulatory Visit: Payer: Self-pay

## 2020-06-17 DIAGNOSIS — N189 Chronic kidney disease, unspecified: Secondary | ICD-10-CM | POA: Diagnosis not present

## 2020-06-17 DIAGNOSIS — D631 Anemia in chronic kidney disease: Secondary | ICD-10-CM | POA: Diagnosis not present

## 2020-06-17 MED ORDER — SODIUM CHLORIDE 0.9 % IV SOLN
510.0000 mg | INTRAVENOUS | Status: DC
  Administered 2020-06-17: 510 mg via INTRAVENOUS
  Filled 2020-06-17: qty 17

## 2020-06-18 DIAGNOSIS — R2689 Other abnormalities of gait and mobility: Secondary | ICD-10-CM | POA: Diagnosis not present

## 2020-06-18 DIAGNOSIS — E1140 Type 2 diabetes mellitus with diabetic neuropathy, unspecified: Secondary | ICD-10-CM | POA: Diagnosis not present

## 2020-06-18 DIAGNOSIS — N189 Chronic kidney disease, unspecified: Secondary | ICD-10-CM | POA: Diagnosis not present

## 2020-06-20 ENCOUNTER — Encounter: Payer: Self-pay | Admitting: Neurology

## 2020-06-24 ENCOUNTER — Other Ambulatory Visit: Payer: Self-pay

## 2020-06-24 ENCOUNTER — Encounter (HOSPITAL_COMMUNITY)
Admission: RE | Admit: 2020-06-24 | Discharge: 2020-06-24 | Disposition: A | Discharge status: Home / Self Care | Payer: Medicare Other | Source: Ambulatory Visit | Attending: Nephrology | Admitting: Nephrology

## 2020-06-24 DIAGNOSIS — D631 Anemia in chronic kidney disease: Secondary | ICD-10-CM | POA: Diagnosis not present

## 2020-06-24 DIAGNOSIS — N189 Chronic kidney disease, unspecified: Secondary | ICD-10-CM | POA: Diagnosis not present

## 2020-06-24 MED ORDER — SODIUM CHLORIDE 0.9 % IV SOLN
510.0000 mg | INTRAVENOUS | Status: DC
  Administered 2020-06-24: 510 mg via INTRAVENOUS
  Filled 2020-06-24: qty 17

## 2020-06-24 MED FILL — GABAPENTIN 100 MG CAPSULE: 100 | 30 days supply | Qty: 60 | Fill #0

## 2020-06-27 ENCOUNTER — Ambulatory Visit: Payer: Medicare Other

## 2020-06-28 DIAGNOSIS — M542 Cervicalgia: Secondary | ICD-10-CM | POA: Diagnosis not present

## 2020-07-03 DIAGNOSIS — N2581 Secondary hyperparathyroidism of renal origin: Secondary | ICD-10-CM | POA: Diagnosis not present

## 2020-07-03 DIAGNOSIS — E1122 Type 2 diabetes mellitus with diabetic chronic kidney disease: Secondary | ICD-10-CM | POA: Diagnosis not present

## 2020-07-03 DIAGNOSIS — N184 Chronic kidney disease, stage 4 (severe): Secondary | ICD-10-CM | POA: Diagnosis not present

## 2020-07-03 DIAGNOSIS — D631 Anemia in chronic kidney disease: Secondary | ICD-10-CM | POA: Diagnosis not present

## 2020-07-03 DIAGNOSIS — I129 Hypertensive chronic kidney disease with stage 1 through stage 4 chronic kidney disease, or unspecified chronic kidney disease: Secondary | ICD-10-CM | POA: Diagnosis not present

## 2020-07-03 MED FILL — GABAPENTIN 100 MG CAPSULE: 100 | 30 days supply | Qty: 60 | Fill #0

## 2020-07-04 ENCOUNTER — Ambulatory Visit: Payer: Medicare Other | Attending: Chiropractic Medicine | Admitting: Rehabilitative and Restorative Service Providers"

## 2020-07-05 ENCOUNTER — Other Ambulatory Visit: Payer: Self-pay | Admitting: Internal Medicine

## 2020-07-07 ENCOUNTER — Ambulatory Visit: Payer: Medicare Other | Admitting: Physical Therapy

## 2020-07-09 ENCOUNTER — Telehealth: Payer: Self-pay | Admitting: Internal Medicine

## 2020-07-10 MED FILL — oxyCODONE HCL 15 MG TABS: 15 | 30 days supply | Qty: 120 | Fill #0

## 2020-07-10 NOTE — Telephone Encounter (Signed)
Left a message for patient to call back. 

## 2020-07-14 NOTE — Telephone Encounter (Signed)
Pt did not return call. 

## 2020-07-28 ENCOUNTER — Other Ambulatory Visit: Payer: Self-pay

## 2020-07-28 ENCOUNTER — Encounter: Payer: Self-pay | Admitting: Internal Medicine

## 2020-07-28 ENCOUNTER — Ambulatory Visit (INDEPENDENT_AMBULATORY_CARE_PROVIDER_SITE_OTHER): Payer: Medicare Other | Admitting: Internal Medicine

## 2020-07-28 DIAGNOSIS — G8929 Other chronic pain: Secondary | ICD-10-CM

## 2020-07-28 DIAGNOSIS — N184 Chronic kidney disease, stage 4 (severe): Secondary | ICD-10-CM | POA: Diagnosis not present

## 2020-07-28 DIAGNOSIS — R296 Repeated falls: Secondary | ICD-10-CM

## 2020-07-28 DIAGNOSIS — E119 Type 2 diabetes mellitus without complications: Secondary | ICD-10-CM

## 2020-07-28 DIAGNOSIS — M545 Low back pain, unspecified: Secondary | ICD-10-CM

## 2020-07-28 DIAGNOSIS — F411 Generalized anxiety disorder: Secondary | ICD-10-CM

## 2020-07-28 DIAGNOSIS — R413 Other amnesia: Secondary | ICD-10-CM

## 2020-07-28 DIAGNOSIS — J069 Acute upper respiratory infection, unspecified: Secondary | ICD-10-CM

## 2020-07-28 DIAGNOSIS — F332 Major depressive disorder, recurrent severe without psychotic features: Secondary | ICD-10-CM | POA: Diagnosis not present

## 2020-07-28 MED ORDER — OXYCODONE HCL 15 MG PO TABS: 15.0000 mg | ORAL_TABLET | Freq: Four times a day (QID) | ORAL | 0 refills | Status: DC

## 2020-07-28 MED ORDER — OXYCODONE HCL 15 MG PO TABS: 15.0000 mg | ORAL_TABLET | Freq: Four times a day (QID) | ORAL | 0 refills | Status: DC | PRN

## 2020-07-28 MED ORDER — LORAZEPAM 2 MG PO TABS: ORAL_TABLET | ORAL | 2 refills | Status: DC

## 2020-07-28 NOTE — Patient Instructions (Addendum)
Try lion's mane mushroom for memory  Start: Sinus rinse Claritin daily PO antibiotic if not better

## 2020-07-28 NOTE — Assessment & Plan Note (Signed)
Lorazepam prn  Potential benefits of a long term benzodiazepines  use as well as potential risks  and complications were explained to the patient and were aknowledged.  

## 2020-07-28 NOTE — Assessment & Plan Note (Signed)
Try lions mane mushroom for memory 

## 2020-07-28 NOTE — Progress Notes (Signed)
Subjective:  Patient ID: CARISHA KANTOR, female    DOB: 12-01-1951  Age: 69 y.o. MRN: [id number]  CC: No chief complaint on file.   HPI ANALIAH DRUM presents for LBP, knee pain - chronic; anxiety, HTN f/u  Outpatient Medications Prior to Visit  Medication Sig Dispense Refill  . amoxicillin (AMOXIL) 500 MG capsule TAKE 4 CAPSULES BY MOUTH 1  HOUR PRIOR TO DENTAL  PROCEDURE/CLEANING 16 capsule 1  . blood glucose meter kit and supplies Dispense based on patient and insurance preference. Used to check blood sugar daily, DX: E11.9 OneTouch 1 each 0  . buPROPion (WELLBUTRIN XL) 300 MG 24 hr tablet TAKE 1 TABLET BY MOUTH  DAILY 90 tablet 3  . carvedilol (COREG) 25 MG tablet TAKE ONE-HALF TABLET BY  MOUTH TWICE A DAY WITH A  MEAL 90 tablet 3  . cyclobenzaprine (FLEXERIL) 10 MG tablet TAKE 3 TABLETS BY MOUTH AT  BEDTIME 270 tablet 1  . dicyclomine (BENTYL) 20 MG tablet Take one tablet every 4-6 hours as needed for abdominal pain 60 tablet 2  . DULoxetine (CYMBALTA) 60 MG capsule TAKE 1 CAPSULE BY MOUTH AT  BEDTIME 90 capsule 3  . gabapentin (NEURONTIN) 100 MG capsule Take 100 mg by mouth daily.     Marland Kitchen glimepiride (AMARYL) 1 MG tablet TAKE 1 TABLET BY MOUTH  DAILY BEFORE BREAKFAST 90 tablet 3  . glucose blood (TRUETEST TEST) test strip Used to check blood sugar daily, DX: E11.9 100 each 3  . ketoconazole (NIZORAL) 200 MG tablet Take 0.5 tablets (100 mg total) by mouth daily. 20 tablet 2  . Lancets (ONETOUCH ULTRASOFT) lancets Used to check blood sugar daily, DX: E11.9 100 each 3  . lidocaine (XYLOCAINE) 5 % ointment Apply topically 3 (three) times daily as needed. 100 g 3  . LORazepam (ATIVAN) 2 MG tablet TAKE 1/2 TO 1 TABLET BY MOUTH EVERY 6 HOURS AS NEEDED FOR ANXIETY 90 tablet 2  . Multiple Vitamin (MULTIVITAMIN WITH MINERALS) TABS tablet Take 1 tablet by mouth daily.    . Omega-3 300 MG CAPS Take 300 mg by mouth at bedtime.    Marland Kitchen oxyCODONE (ROXICODONE) 15 MG immediate release tablet Take 1  tablet (15 mg total) by mouth 4 (four) times daily. 120 tablet 0  . oxyCODONE (ROXICODONE) 15 MG immediate release tablet Take 1 tablet (15 mg total) by mouth 4 (four) times daily. 120 tablet 0  . oxyCODONE (ROXICODONE) 15 MG immediate release tablet Take 1 tablet (15 mg total) by mouth every 6 (six) hours as needed for pain. 120 tablet 0  . pantoprazole (PROTONIX) 40 MG tablet TAKE 1 TABLET BY MOUTH  DAILY 90 tablet 3  . pilocarpine (SALAGEN) 5 MG tablet TAKE 1 TABLET BY MOUTH TWO  TIMES DAILY 180 tablet 3  . pioglitazone (ACTOS) 15 MG tablet TAKE 1 TABLET BY MOUTH  DAILY 90 tablet 3  . polyvinyl alcohol (LIQUIFILM TEARS) 1.4 % ophthalmic solution Place 1 drop into both eyes daily as needed for dry eyes.    . PSYLLIUM PO Take 1 capsule by mouth 2 (two) times daily.    . solifenacin (VESICARE) 5 MG tablet TAKE 1 TABLET BY MOUTH  DAILY 90 tablet 3  . torsemide (DEMADEX) 100 MG tablet Take 1 tablet (100 mg total) by mouth daily. 90 tablet 3  . FA-B6-B12-Omega 3-Phytosterols (BP VIT 3) 1 MG CAPS omega 3 500 mg-dha-epa-B12 500 mcg-FA 1 mg-B6 12.5 mg-phytosterol cap  Take by oral route.  No facility-administered medications prior to visit.    ROS: Review of Systems  Constitutional: Positive for fatigue. Negative for activity change, appetite change, chills and unexpected weight change.  HENT: Negative for congestion, mouth sores and sinus pressure.   Eyes: Negative for visual disturbance.  Respiratory: Negative for cough and chest tightness.   Cardiovascular: Positive for leg swelling.  Gastrointestinal: Negative for abdominal pain and nausea.  Genitourinary: Negative for difficulty urinating, frequency and vaginal pain.  Musculoskeletal: Positive for arthralgias, back pain, gait problem and joint swelling.  Skin: Negative for pallor and rash.  Neurological: Positive for weakness. Negative for dizziness, tremors, numbness and headaches.  Psychiatric/Behavioral: Negative for confusion, sleep  disturbance and suicidal ideas. The patient is nervous/anxious.     Objective:  BP (!) 162/80 (BP Location: Left Arm, Patient Position: Sitting, Cuff Size: Large)   Pulse 90   Temp 98.1 F (36.7 C) (Oral)   Ht '5\' 2"'$  (1.575 m)   Wt 259 lb (117.5 kg)   SpO2 95%   BMI 47.37 kg/m   BP Readings from Last 3 Encounters:  07/28/20 (!) 162/80  06/24/20 113/70  06/17/20 (!) 111/52    Wt Readings from Last 3 Encounters:  07/28/20 259 lb (117.5 kg)  05/28/20 255 lb (115.7 kg)  04/30/20 255 lb (115.7 kg)    Physical Exam Constitutional:      General: She is not in acute distress.    Appearance: She is well-developed.  HENT:     Head: Normocephalic.     Right Ear: External ear normal.     Left Ear: External ear normal.     Nose: Nose normal.  Eyes:     General:        Right eye: No discharge.        Left eye: No discharge.     Conjunctiva/sclera: Conjunctivae normal.     Pupils: Pupils are equal, round, and reactive to light.  Neck:     Thyroid: No thyromegaly.     Vascular: No JVD.     Trachea: No tracheal deviation.  Cardiovascular:     Rate and Rhythm: Normal rate and regular rhythm.     Heart sounds: Normal heart sounds.  Pulmonary:     Effort: No respiratory distress.     Breath sounds: No stridor. No wheezing.  Abdominal:     General: Bowel sounds are normal. There is no distension.     Palpations: Abdomen is soft. There is no mass.     Tenderness: There is no abdominal tenderness. There is no guarding or rebound.  Musculoskeletal:        General: Tenderness present.     Cervical back: Normal range of motion and neck supple.     Right lower leg: Edema present.     Left lower leg: Edema present.  Lymphadenopathy:     Cervical: No cervical adenopathy.  Skin:    Findings: No erythema or rash.  Neurological:     Cranial Nerves: No cranial nerve deficit.     Motor: No abnormal muscle tone.     Coordination: Coordination normal.     Gait: Gait abnormal.     Deep  Tendon Reflexes: Reflexes normal.  Psychiatric:        Behavior: Behavior normal.        Thought Content: Thought content normal.        Judgment: Judgment normal.     Lab Results  Component Value Date   WBC 10.0 04/16/2020  HGB 8.8 Repeated and verified X2. (L) 04/16/2020   HCT 26.2 (L) 04/16/2020   PLT 213.0 04/16/2020   GLUCOSE 192 (H) 04/16/2020   CHOL 215 (H) 09/25/2014   TRIG 154.0 (H) 09/25/2014   HDL 46.00 09/25/2014   LDLCALC 138 (H) 09/25/2014   ALT 15 04/16/2020   AST 18 04/16/2020   NA 130 (L) 04/16/2020   K 4.2 04/16/2020   CL 93 (L) 04/16/2020   CREATININE 3.92 (H) 04/16/2020   BUN 79 (H) 04/16/2020   CO2 26 04/16/2020   TSH 3.98 04/16/2020   INR 1.04 02/26/2015   HGBA1C 6.8 (H) 04/16/2020    No results found.  Assessment & Plan:   [name], MD

## 2020-07-28 NOTE — Assessment & Plan Note (Signed)
Multifactorial Try lion's mane mushroom for memory

## 2020-07-28 NOTE — Assessment & Plan Note (Signed)
Sinus rinse Claritin PO abx if not better

## 2020-07-28 NOTE — Assessment & Plan Note (Signed)
HD being discussed A-V shunt is pending

## 2020-07-28 NOTE — Assessment & Plan Note (Signed)
Discussed.

## 2020-07-29 ENCOUNTER — Telehealth: Payer: Self-pay

## 2020-07-29 DIAGNOSIS — M542 Cervicalgia: Secondary | ICD-10-CM | POA: Diagnosis not present

## 2020-07-29 LAB — PMP SCREEN PROFILE (10S), URINE
Amphetamine Scrn, Ur: NEGATIVE ng/mL
BARBITURATE SCREEN URINE: NEGATIVE ng/mL
BENZODIAZEPINE SCREEN, URINE: NEGATIVE ng/mL
CANNABINOIDS UR QL SCN: NEGATIVE ng/mL
Cocaine (Metab) Scrn, Ur: NEGATIVE ng/mL
Creatinine(Crt), U: 19.9 mg/dL — ABNORMAL LOW (ref 20.0–300.0)
Methadone Screen, Urine: NEGATIVE ng/mL
OXYCODONE+OXYMORPHONE UR QL SCN: POSITIVE ng/mL — AB
Opiate Scrn, Ur: POSITIVE ng/mL — AB
Ph of Urine: 6.3 (ref 4.5–8.9)
Phencyclidine Qn, Ur: NEGATIVE ng/mL
Propoxyphene Scrn, Ur: NEGATIVE ng/mL

## 2020-07-29 LAB — BASIC METABOLIC PANEL
BUN: 75 mg/dL — ABNORMAL HIGH (ref 6–23)
CO2: 27 mEq/L (ref 19–32)
Calcium: 9.4 mg/dL (ref 8.4–10.5)
Chloride: 96 mEq/L (ref 96–112)
Creatinine, Ser: 5.14 mg/dL (ref 0.40–1.20)
GFR: 8.33 mL/min — CL (ref >60.00)
Glucose, Bld: 147 mg/dL — ABNORMAL HIGH (ref 70–99)
Potassium: 5.1 mEq/L (ref 3.5–5.1)
Sodium: 134 mEq/L — ABNORMAL LOW (ref 135–145)

## 2020-07-29 LAB — SPECIFIC GRAVITY (REFLEXED): SPECIFIC GRAVITY: 1.0058

## 2020-07-29 LAB — HEMOGLOBIN A1C: Hgb A1c MFr Bld: 6.1 % (ref 4.6–6.5)

## 2020-07-29 NOTE — Telephone Encounter (Signed)
Lab has reported critical results for pt  Creatinine- 5.14 GFR- 8.33

## 2020-07-30 NOTE — Telephone Encounter (Signed)
Noted. Thanks.

## 2020-07-31 ENCOUNTER — Other Ambulatory Visit: Payer: Self-pay | Admitting: Obstetrics and Gynecology

## 2020-07-31 ENCOUNTER — Ambulatory Visit
Admission: RE | Admit: 2020-07-31 | Discharge: 2020-07-31 | Disposition: A | Discharge status: Home / Self Care | Payer: Medicare Other | Source: Ambulatory Visit | Attending: Obstetrics and Gynecology | Admitting: Obstetrics and Gynecology

## 2020-07-31 ENCOUNTER — Other Ambulatory Visit: Payer: Self-pay

## 2020-07-31 DIAGNOSIS — R928 Other abnormal and inconclusive findings on diagnostic imaging of breast: Secondary | ICD-10-CM | POA: Diagnosis not present

## 2020-07-31 DIAGNOSIS — N641 Fat necrosis of breast: Secondary | ICD-10-CM

## 2020-07-31 DIAGNOSIS — N6322 Unspecified lump in the left breast, upper inner quadrant: Secondary | ICD-10-CM | POA: Diagnosis not present

## 2020-07-31 DIAGNOSIS — N6321 Unspecified lump in the left breast, upper outer quadrant: Secondary | ICD-10-CM | POA: Diagnosis not present

## 2020-08-08 MED FILL — oxyCODONE HCL 15 MG TABS: 15 | 30 days supply | Qty: 120 | Fill #0

## 2020-08-11 DIAGNOSIS — I129 Hypertensive chronic kidney disease with stage 1 through stage 4 chronic kidney disease, or unspecified chronic kidney disease: Secondary | ICD-10-CM | POA: Diagnosis not present

## 2020-08-11 DIAGNOSIS — E1122 Type 2 diabetes mellitus with diabetic chronic kidney disease: Secondary | ICD-10-CM | POA: Diagnosis not present

## 2020-08-11 DIAGNOSIS — D631 Anemia in chronic kidney disease: Secondary | ICD-10-CM | POA: Diagnosis not present

## 2020-08-11 DIAGNOSIS — N184 Chronic kidney disease, stage 4 (severe): Secondary | ICD-10-CM | POA: Diagnosis not present

## 2020-08-11 DIAGNOSIS — N2581 Secondary hyperparathyroidism of renal origin: Secondary | ICD-10-CM | POA: Diagnosis not present

## 2020-08-13 ENCOUNTER — Telehealth: Payer: Self-pay | Admitting: Internal Medicine

## 2020-08-13 NOTE — Telephone Encounter (Signed)
Needs a virtual appt or be seen in person at urgent care

## 2020-08-13 NOTE — Telephone Encounter (Signed)
Coughing up green phelgm, needs antibiotic Supposed to have a shunt put in this week, and may not do it if she has the cough

## 2020-08-13 NOTE — Telephone Encounter (Signed)
Please advise in PCPs absence.  

## 2020-08-13 NOTE — Telephone Encounter (Signed)
Scheduled pt a telephone visit with Dr. Jenny Reichmann 08/14/2020 at 10:00 am

## 2020-08-14 ENCOUNTER — Encounter: Payer: Self-pay | Admitting: Internal Medicine

## 2020-08-14 ENCOUNTER — Telehealth (INDEPENDENT_AMBULATORY_CARE_PROVIDER_SITE_OTHER): Payer: Medicare Other | Admitting: Internal Medicine

## 2020-08-14 DIAGNOSIS — R059 Cough, unspecified: Secondary | ICD-10-CM | POA: Insufficient documentation

## 2020-08-14 DIAGNOSIS — R05 Cough: Secondary | ICD-10-CM | POA: Diagnosis not present

## 2020-08-14 DIAGNOSIS — J309 Allergic rhinitis, unspecified: Secondary | ICD-10-CM

## 2020-08-14 DIAGNOSIS — E119 Type 2 diabetes mellitus without complications: Secondary | ICD-10-CM | POA: Diagnosis not present

## 2020-08-14 MED ORDER — FEXOFENADINE HCL 180 MG PO TABS
180.0000 mg | ORAL_TABLET | Freq: Every day | ORAL | 3 refills | Status: DC
Start: 2020-08-14 — End: 2021-08-14

## 2020-08-14 MED ORDER — TRIAMCINOLONE ACETONIDE 55 MCG/ACT NA AERO
2.0000 | INHALATION_SPRAY | Freq: Every day | NASAL | 3 refills | Status: DC
Start: 2020-08-14 — End: 2021-01-28

## 2020-08-14 NOTE — Assessment & Plan Note (Signed)
stable overall by history and exam, recent data reviewed with pt, and pt to continue medical treatment as before,  to f/u any worsening symptoms or concerns  

## 2020-08-14 NOTE — Assessment & Plan Note (Signed)
Mild to mod, for allegra qd prn, nasacort asd, prn,,  to f/u any worsening symptoms or concerns

## 2020-08-14 NOTE — Patient Instructions (Signed)
Please take all new medication as prescribed - the allegra and nasacort for the allergies causing the post nasal drip  Please continue all other medications as before, and refills have been done if requested.  Please have the pharmacy call with any other refills you may need.  Please keep your appointments with your specialists as you may have planned

## 2020-08-14 NOTE — Progress Notes (Signed)
Patient ID: [name], female   DOB: June 09, 1951, 69 y.o.   MRN: [id number]  Cumulative time during 7-day interval 21 min, there was not an associated office visit for this concern within a 7 day period.  Verbal consent for services obtained from patient prior to services given. Names of all persons present for services: Cathlean Cower, MD, patient  Chief complaint: cough  History, background, results pertinent:  Does have several wks ongoing nasal allergy symptoms with clearish congestion, itch and sneezing, without fever, pain, ST, swelling or wheezing, but with scant prod cough yellowish, worse at night.  Pt denies chest pain, increased sob or doe, wheezing, orthopnea, PND, increased LE swelling, palpitations, dizziness or syncope.   Pt denies polydipsia, polyuria    Past Medical History:  Diagnosis Date  . Anemia   . Anxiety   . Colitis 2010   microscopic- Dr Henrene Pastor  . Depression   . Diabetes mellitus    type II  . GERD (gastroesophageal reflux disease)   . Headache   . Hypertension   . IBS (irritable bowel syndrome)   . LBP (low back pain)   . Neuropathy    feet bilat   . Osteoarthritis   . Osteopenia   . Pneumonia    hx of 2014   . Renal insufficiency 2011  . Sinusitis    currently being treated with antibiotic will complete 03/04/2015   No results found for this or any previous visit (from the past 48 hour(s)). Current Outpatient Medications on File Prior to Visit  Medication Sig Dispense Refill  . amoxicillin (AMOXIL) 500 MG capsule TAKE 4 CAPSULES BY MOUTH 1  HOUR PRIOR TO DENTAL  PROCEDURE/CLEANING 16 capsule 1  . blood glucose meter kit and supplies Dispense based on patient and insurance preference. Used to check blood sugar daily, DX: E11.9 OneTouch 1 each 0  . buPROPion (WELLBUTRIN XL) 300 MG 24 hr tablet TAKE 1 TABLET BY MOUTH  DAILY 90 tablet 3  . carvedilol (COREG) 25 MG tablet TAKE ONE-HALF TABLET BY  MOUTH TWICE A DAY WITH A  MEAL 90 tablet 3  . cyclobenzaprine  (FLEXERIL) 10 MG tablet TAKE 3 TABLETS BY MOUTH AT  BEDTIME 270 tablet 1  . dicyclomine (BENTYL) 20 MG tablet Take one tablet every 4-6 hours as needed for abdominal pain 60 tablet 2  . DULoxetine (CYMBALTA) 60 MG capsule TAKE 1 CAPSULE BY MOUTH AT  BEDTIME 90 capsule 3  . gabapentin (NEURONTIN) 100 MG capsule Take 100 mg by mouth daily.     Marland Kitchen glimepiride (AMARYL) 1 MG tablet TAKE 1 TABLET BY MOUTH  DAILY BEFORE BREAKFAST 90 tablet 3  . glucose blood (TRUETEST TEST) test strip Used to check blood sugar daily, DX: E11.9 100 each 3  . ketoconazole (NIZORAL) 200 MG tablet Take 0.5 tablets (100 mg total) by mouth daily. 20 tablet 2  . Lancets (ONETOUCH ULTRASOFT) lancets Used to check blood sugar daily, DX: E11.9 100 each 3  . lidocaine (XYLOCAINE) 5 % ointment Apply topically 3 (three) times daily as needed. 100 g 3  . LORazepam (ATIVAN) 2 MG tablet 1 po qhs prn insomnia 90 tablet 2  . Multiple Vitamin (MULTIVITAMIN WITH MINERALS) TABS tablet Take 1 tablet by mouth daily.    . Omega-3 300 MG CAPS Take 300 mg by mouth at bedtime.    Marland Kitchen oxyCODONE (ROXICODONE) 15 MG immediate release tablet Take 1 tablet (15 mg total) by mouth 4 (four) times daily. 120 tablet 0  .  oxyCODONE (ROXICODONE) 15 MG immediate release tablet Take 1 tablet (15 mg total) by mouth 4 (four) times daily. 120 tablet 0  . oxyCODONE (ROXICODONE) 15 MG immediate release tablet Take 1 tablet (15 mg total) by mouth every 6 (six) hours as needed for pain. 120 tablet 0  . pantoprazole (PROTONIX) 40 MG tablet TAKE 1 TABLET BY MOUTH  DAILY 90 tablet 3  . pilocarpine (SALAGEN) 5 MG tablet TAKE 1 TABLET BY MOUTH TWO  TIMES DAILY 180 tablet 3  . pioglitazone (ACTOS) 15 MG tablet TAKE 1 TABLET BY MOUTH  DAILY 90 tablet 3  . polyvinyl alcohol (LIQUIFILM TEARS) 1.4 % ophthalmic solution Place 1 drop into both eyes daily as needed for dry eyes.    . PSYLLIUM PO Take 1 capsule by mouth 2 (two) times daily.    . solifenacin (VESICARE) 5 MG tablet TAKE  1 TABLET BY MOUTH  DAILY 90 tablet 3  . torsemide (DEMADEX) 100 MG tablet Take 1 tablet (100 mg total) by mouth daily. 90 tablet 3   No current facility-administered medications on file prior to visit.   Lab Results  Component Value Date   WBC 10.0 04/16/2020   HGB 8.8 Repeated and verified X2. (L) 04/16/2020   HCT 26.2 (L) 04/16/2020   PLT 213.0 04/16/2020   GLUCOSE 147 (H) 07/28/2020   CHOL 215 (H) 09/25/2014   TRIG 154.0 (H) 09/25/2014   HDL 46.00 09/25/2014   LDLCALC 138 (H) 09/25/2014   ALT 15 04/16/2020   AST 18 04/16/2020   NA 134 (L) 07/28/2020   K 5.1 07/28/2020   CL 96 07/28/2020   CREATININE 5.14 (HH) 07/28/2020   BUN 75 (H) 07/28/2020   CO2 27 07/28/2020   TSH 3.98 04/16/2020   INR 1.04 02/26/2015   HGBA1C 6.1 07/28/2020   A/P/next steps:  See notes    mD  

## 2020-08-14 NOTE — Assessment & Plan Note (Addendum)
Mild to mod, for tx per allergic rhinitis asnd post nasal gtt,,  to f/u any worsening symptoms or concerns  I spent 31 minutes in addition to time for CPX wellness examination in preparing to see the patient by review of recent labs, imaging and procedures, obtaining and reviewing separately obtained history, communicating with the patient and family or caregiver, ordering medications, tests or procedures, and documenting clinical information in the EHR including the differential Dx, treatment, and any further evaluation and other management of cough, allergies, dm

## 2020-08-19 DIAGNOSIS — D509 Iron deficiency anemia, unspecified: Secondary | ICD-10-CM | POA: Insufficient documentation

## 2020-08-19 DIAGNOSIS — D688 Other specified coagulation defects: Secondary | ICD-10-CM | POA: Insufficient documentation

## 2020-08-19 DIAGNOSIS — K589 Irritable bowel syndrome without diarrhea: Secondary | ICD-10-CM | POA: Insufficient documentation

## 2020-08-20 ENCOUNTER — Other Ambulatory Visit: Payer: Self-pay | Admitting: Internal Medicine

## 2020-08-21 ENCOUNTER — Other Ambulatory Visit: Payer: Self-pay

## 2020-08-21 DIAGNOSIS — N184 Chronic kidney disease, stage 4 (severe): Secondary | ICD-10-CM

## 2020-08-22 DIAGNOSIS — Z992 Dependence on renal dialysis: Secondary | ICD-10-CM | POA: Diagnosis not present

## 2020-08-22 DIAGNOSIS — N186 End stage renal disease: Secondary | ICD-10-CM | POA: Diagnosis not present

## 2020-08-22 DIAGNOSIS — R0602 Shortness of breath: Secondary | ICD-10-CM | POA: Insufficient documentation

## 2020-08-26 DIAGNOSIS — E1129 Type 2 diabetes mellitus with other diabetic kidney complication: Secondary | ICD-10-CM | POA: Diagnosis not present

## 2020-08-26 DIAGNOSIS — N186 End stage renal disease: Secondary | ICD-10-CM | POA: Diagnosis not present

## 2020-08-26 DIAGNOSIS — N2581 Secondary hyperparathyroidism of renal origin: Secondary | ICD-10-CM | POA: Diagnosis not present

## 2020-08-26 DIAGNOSIS — D688 Other specified coagulation defects: Secondary | ICD-10-CM | POA: Diagnosis not present

## 2020-08-26 DIAGNOSIS — G8929 Other chronic pain: Secondary | ICD-10-CM | POA: Diagnosis not present

## 2020-08-26 DIAGNOSIS — D631 Anemia in chronic kidney disease: Secondary | ICD-10-CM | POA: Diagnosis not present

## 2020-08-26 DIAGNOSIS — Z992 Dependence on renal dialysis: Secondary | ICD-10-CM | POA: Diagnosis not present

## 2020-08-28 DIAGNOSIS — N2581 Secondary hyperparathyroidism of renal origin: Secondary | ICD-10-CM | POA: Diagnosis not present

## 2020-08-28 DIAGNOSIS — D688 Other specified coagulation defects: Secondary | ICD-10-CM | POA: Diagnosis not present

## 2020-08-28 DIAGNOSIS — N186 End stage renal disease: Secondary | ICD-10-CM | POA: Diagnosis not present

## 2020-08-28 DIAGNOSIS — Z992 Dependence on renal dialysis: Secondary | ICD-10-CM | POA: Diagnosis not present

## 2020-08-28 DIAGNOSIS — G8929 Other chronic pain: Secondary | ICD-10-CM | POA: Diagnosis not present

## 2020-08-28 DIAGNOSIS — D631 Anemia in chronic kidney disease: Secondary | ICD-10-CM | POA: Diagnosis not present

## 2020-08-28 DIAGNOSIS — E1129 Type 2 diabetes mellitus with other diabetic kidney complication: Secondary | ICD-10-CM | POA: Diagnosis not present

## 2020-08-29 ENCOUNTER — Other Ambulatory Visit: Payer: Self-pay | Admitting: Internal Medicine

## 2020-08-29 DIAGNOSIS — E119 Type 2 diabetes mellitus without complications: Secondary | ICD-10-CM

## 2020-08-29 DIAGNOSIS — M542 Cervicalgia: Secondary | ICD-10-CM | POA: Diagnosis not present

## 2020-08-29 NOTE — Telephone Encounter (Signed)
glucose blood (TRUETEST TEST) test strip  [place], [place] - [address] [facility]. Phone:  [phone]  Fax:  [phone]

## 2020-08-30 DIAGNOSIS — G8929 Other chronic pain: Secondary | ICD-10-CM | POA: Diagnosis not present

## 2020-08-30 DIAGNOSIS — N186 End stage renal disease: Secondary | ICD-10-CM | POA: Diagnosis not present

## 2020-08-30 DIAGNOSIS — D688 Other specified coagulation defects: Secondary | ICD-10-CM | POA: Diagnosis not present

## 2020-08-30 DIAGNOSIS — Z992 Dependence on renal dialysis: Secondary | ICD-10-CM | POA: Diagnosis not present

## 2020-08-30 DIAGNOSIS — D631 Anemia in chronic kidney disease: Secondary | ICD-10-CM | POA: Diagnosis not present

## 2020-08-30 DIAGNOSIS — N2581 Secondary hyperparathyroidism of renal origin: Secondary | ICD-10-CM | POA: Diagnosis not present

## 2020-08-30 DIAGNOSIS — E1129 Type 2 diabetes mellitus with other diabetic kidney complication: Secondary | ICD-10-CM | POA: Diagnosis not present

## 2020-09-02 DIAGNOSIS — E1129 Type 2 diabetes mellitus with other diabetic kidney complication: Secondary | ICD-10-CM | POA: Diagnosis not present

## 2020-09-02 DIAGNOSIS — N2581 Secondary hyperparathyroidism of renal origin: Secondary | ICD-10-CM | POA: Diagnosis not present

## 2020-09-02 DIAGNOSIS — G8929 Other chronic pain: Secondary | ICD-10-CM | POA: Diagnosis not present

## 2020-09-02 DIAGNOSIS — D688 Other specified coagulation defects: Secondary | ICD-10-CM | POA: Diagnosis not present

## 2020-09-02 DIAGNOSIS — D631 Anemia in chronic kidney disease: Secondary | ICD-10-CM | POA: Diagnosis not present

## 2020-09-02 DIAGNOSIS — Z992 Dependence on renal dialysis: Secondary | ICD-10-CM | POA: Diagnosis not present

## 2020-09-02 DIAGNOSIS — N186 End stage renal disease: Secondary | ICD-10-CM | POA: Diagnosis not present

## 2020-09-03 ENCOUNTER — Other Ambulatory Visit: Payer: Self-pay

## 2020-09-03 ENCOUNTER — Encounter: Payer: Self-pay | Admitting: Vascular Surgery

## 2020-09-03 ENCOUNTER — Ambulatory Visit (INDEPENDENT_AMBULATORY_CARE_PROVIDER_SITE_OTHER)
Admission: RE | Admit: 2020-09-03 | Discharge: 2020-09-03 | Disposition: A | Discharge status: Home / Self Care | Payer: Medicare Other | Source: Ambulatory Visit | Attending: Vascular Surgery | Admitting: Vascular Surgery

## 2020-09-03 ENCOUNTER — Ambulatory Visit: Payer: Medicare Other | Admitting: Vascular Surgery

## 2020-09-03 ENCOUNTER — Ambulatory Visit (HOSPITAL_COMMUNITY)
Admission: RE | Admit: 2020-09-03 | Discharge: 2020-09-03 | Disposition: A | Discharge status: Home / Self Care | Payer: Medicare Other | Source: Ambulatory Visit | Attending: Vascular Surgery | Admitting: Vascular Surgery

## 2020-09-03 VITALS — BP 103/64 | HR 77 | Temp 98.1°F | Resp 20 | Ht 62.0 in | Wt 241.0 lb

## 2020-09-03 DIAGNOSIS — N184 Chronic kidney disease, stage 4 (severe): Secondary | ICD-10-CM | POA: Diagnosis not present

## 2020-09-03 DIAGNOSIS — N185 Chronic kidney disease, stage 5: Secondary | ICD-10-CM | POA: Diagnosis not present

## 2020-09-03 NOTE — H&P (View-Only) (Signed)
 REASON FOR CONSULT:    To evaluate for hemodialysis access.  The patient is referred by Dr. Kellie Goldsborough.  ASSESSMENT & PLAN:   END-STAGE RENAL DISEASE: This patient has end-stage renal disease.  She dialyzes Tuesdays Thursdays and Saturdays.  She is right-handed.  She does not have a pacemaker.  She is not on any blood thinners.  Based on her vein map she may be a candidate for right brachiocephalic fistula.  The second option on the right would be a basilic vein transposition.  I explained that if this is the case we oftentimes do this in 2 stages.  If neither were possible we would place an AV graft.  The veins in the left arm were somewhat smaller which is widely selected the right side.  I have explained the indications for placement of an AV fistula or AV graft. I've explained that if at all possible we will place an AV fistula.  I have reviewed the risks of placement of an AV fistula including but not limited to: failure of the fistula to mature, need for subsequent interventions, and thrombosis. In addition I have reviewed the potential complications of placement of an AV graft. These risks include, but are not limited to, graft thrombosis, graft infection, wound healing problems, bleeding, arm swelling, and steal syndrome. All the patient's questions were answered and they are agreeable to proceed with surgery.   Mirna Sutcliffe, MD Office: [phone]   HPI:   [name] is a pleasant 69 y.o. female, with stage V chronic kidney disease.  She is referred for hemodialysis access.  We have been asked to place access and also a tunneled dialysis catheter.  I have reviewed the records from the referring office.  The patient has stage V chronic kidney disease.  She has well-controlled hypertension and diabetes.  Her renal function has deteriorated and we have been asked to provide hemodialysis access.  The patient dialyzes on Tuesdays Thursdays and Saturdays.  She has a  functioning right IJ tunneled dialysis catheter that was placed by [facility].  She is right-handed.  She does not have a pacemaker.  She is not on any blood thinners.  She denies any recent uremic symptoms.  Specifically, she denies nausea, vomiting, fatigue, anorexia, or palpitations.  She has no significant cardiac history.  She denies any history of myocardial infarction or history congestive heart failure.  Her chronic kidney disease I believe is secondary to diabetes and hypertension.  Past Medical History:  Diagnosis Date  . Anemia   . Anxiety   . Colitis 2010   microscopic- Dr Perry  . Depression   . Diabetes mellitus    type II  . GERD (gastroesophageal reflux disease)   . Headache   . Hypertension   . IBS (irritable bowel syndrome)   . LBP (low back pain)   . Neuropathy    feet bilat   . Osteoarthritis   . Osteopenia   . Pneumonia    hx of 2014   . Renal insufficiency 2011  . Sinusitis    currently being treated with antibiotic will complete 03/04/2015    Family History  Problem Relation Age of Onset  . Depression Mother   . Hypertension Mother   . Stroke Mother 85  . Diabetes Mother   . Diabetes Father   . Hyperlipidemia Father   . Hypertension Father   . Crohn's disease Maternal Uncle   . Diabetes Other   . Cancer Brother          Prostate  . Diabetes Brother   . Heart disease Brother   . Hyperlipidemia Brother   . Hypertension Brother   . Diabetes Brother   . Heart disease Brother        Heart Disease before age 60  . Heart attack Brother   . Stroke Brother        X's 2  . Hyperlipidemia Brother   . Hypertension Brother   . Colon cancer Neg Hx     SOCIAL HISTORY: Social History   Socioeconomic History  . Marital status: Single    Spouse name: Not on file  . Number of children: Not on file  . Years of education: Not on file  . Highest education level: Not on file  Occupational History  . Not on file  Tobacco Use  . Smoking status:  Former Smoker    Packs/day: 1.00    Years: 20.00    Pack years: 20.00    Types: Cigarettes    Quit date: 12/13/1988    Years since quitting: 31.7  . Smokeless tobacco: Never Used  Vaping Use  . Vaping Use: Never used  Substance and Sexual Activity  . Alcohol use: Yes    Alcohol/week: 0.0 standard drinks    Comment: rarely  . Drug use: No  . Sexual activity: Not on file  Other Topics Concern  . Not on file  Social History Narrative  . Not on file   Social Determinants of Health   Financial Resource Strain:   . Difficulty of Paying Living Expenses: Not on file  Food Insecurity:   . Worried About Running Out of Food in the Last Year: Not on file  . Ran Out of Food in the Last Year: Not on file  Transportation Needs:   . Lack of Transportation (Medical): Not on file  . Lack of Transportation (Non-Medical): Not on file  Physical Activity:   . Days of Exercise per Week: Not on file  . Minutes of Exercise per Session: Not on file  Stress:   . Feeling of Stress : Not on file  Social Connections:   . Frequency of Communication with Friends and Family: Not on file  . Frequency of Social Gatherings with Friends and Family: Not on file  . Attends Religious Services: Not on file  . Active Member of Clubs or Organizations: Not on file  . Attends Club or Organization Meetings: Not on file  . Marital Status: Not on file  Intimate Partner Violence:   . Fear of Current or Ex-Partner: Not on file  . Emotionally Abused: Not on file  . Physically Abused: Not on file  . Sexually Abused: Not on file    Allergies  Allergen Reactions  . Erythromycin Diarrhea and Nausea And Vomiting    Current Outpatient Medications  Medication Sig Dispense Refill  . amoxicillin (AMOXIL) 500 MG capsule TAKE 4 CAPSULES BY MOUTH 1  HOUR PRIOR TO DENTAL  PROCEDURE/CLEANING 16 capsule 1  . blood glucose meter kit and supplies Dispense based on patient and insurance preference. Used to check blood sugar  daily, DX: E11.9 OneTouch 1 each 0  . buPROPion (WELLBUTRIN XL) 300 MG 24 hr tablet TAKE 1 TABLET BY MOUTH  DAILY 90 tablet 3  . carvedilol (COREG) 25 MG tablet TAKE ONE-HALF TABLET BY  MOUTH TWICE A DAY WITH A  MEAL 90 tablet 3  . cyclobenzaprine (FLEXERIL) 10 MG tablet TAKE 3 TABLETS BY MOUTH AT  BEDTIME 270 tablet 1  .   dicyclomine (BENTYL) 20 MG tablet Take one tablet every 4-6 hours as needed for abdominal pain 60 tablet 2  . DULoxetine (CYMBALTA) 60 MG capsule TAKE 1 CAPSULE BY MOUTH AT  BEDTIME 90 capsule 3  . fexofenadine (ALLEGRA) 180 MG tablet Take 1 tablet (180 mg total) by mouth daily. 90 tablet 3  . glimepiride (AMARYL) 1 MG tablet TAKE 1 TABLET BY MOUTH  DAILY BEFORE BREAKFAST 90 tablet 3  . glucose blood (TRUETEST TEST) test strip Used to check blood sugar daily, DX: E11.9 100 each 3  . ketoconazole (NIZORAL) 200 MG tablet Take 0.5 tablets (100 mg total) by mouth daily. 20 tablet 2  . Lancets (ONETOUCH ULTRASOFT) lancets Used to check blood sugar daily, DX: E11.9 100 each 3  . lidocaine (XYLOCAINE) 5 % ointment Apply topically 3 (three) times daily as needed. 100 g 3  . LORazepam (ATIVAN) 2 MG tablet 1 po qhs prn insomnia 90 tablet 2  . multivitamin (RENA-VIT) TABS tablet Take 1 tablet by mouth daily.    . Omega-3 300 MG CAPS Take 300 mg by mouth at bedtime.    . oxyCODONE (ROXICODONE) 15 MG immediate release tablet Take 1 tablet (15 mg total) by mouth 4 (four) times daily. 120 tablet 0  . oxyCODONE (ROXICODONE) 15 MG immediate release tablet Take 1 tablet (15 mg total) by mouth 4 (four) times daily. 120 tablet 0  . oxyCODONE (ROXICODONE) 15 MG immediate release tablet Take 1 tablet (15 mg total) by mouth every 6 (six) hours as needed for pain. 120 tablet 0  . pantoprazole (PROTONIX) 40 MG tablet TAKE 1 TABLET BY MOUTH  DAILY 90 tablet 3  . pilocarpine (SALAGEN) 5 MG tablet TAKE 1 TABLET BY MOUTH TWO  TIMES DAILY 180 tablet 3  . pioglitazone (ACTOS) 15 MG tablet TAKE 1 TABLET BY  MOUTH  DAILY 90 tablet 3  . polyvinyl alcohol (LIQUIFILM TEARS) 1.4 % ophthalmic solution Place 1 drop into both eyes daily as needed for dry eyes.    . PSYLLIUM PO Take 1 capsule by mouth 2 (two) times daily.    . repaglinide (PRANDIN) 1 MG tablet Take 1 mg by mouth 3 (three) times daily.    . solifenacin (VESICARE) 5 MG tablet TAKE 1 TABLET BY MOUTH  DAILY 90 tablet 3  . STOOL SOFTENER 100 MG capsule Take 100 mg by mouth daily.    . triamcinolone (NASACORT) 55 MCG/ACT AERO nasal inhaler Place 2 sprays into the nose daily. 3 each 3  . gabapentin (NEURONTIN) 100 MG capsule Take 100 mg by mouth daily.  (Patient not taking: Reported on 09/03/2020)    . Multiple Vitamin (MULTIVITAMIN WITH MINERALS) TABS tablet Take 1 tablet by mouth daily. (Patient not taking: Reported on 09/03/2020)    . torsemide (DEMADEX) 100 MG tablet Take 1 tablet (100 mg total) by mouth daily. (Patient not taking: Reported on 09/03/2020) 90 tablet 3   No current facility-administered medications for this visit.    REVIEW OF SYSTEMS:  [X] denotes positive finding, [ ] denotes negative finding Cardiac  Comments:  Chest pain or chest pressure:    Shortness of breath upon exertion: x   Short of breath when lying flat:    Irregular heart rhythm:        Vascular    Pain in calf, thigh, or hip brought on by ambulation:    Pain in feet at night that wakes you up from your sleep:     Blood clot in   your veins:    Leg swelling:         Pulmonary    Oxygen at home:    Productive cough:     Wheezing:         Neurologic    Sudden weakness in arms or legs:     Sudden numbness in arms or legs:     Sudden onset of difficulty speaking or slurred speech:    Temporary loss of vision in one eye:     Problems with dizziness:         Gastrointestinal    Blood in stool:     Vomited blood:         Genitourinary    Burning when urinating:     Blood in urine:        Psychiatric    Major depression:  x       Hematologic      Bleeding problems:    Problems with blood clotting too easily:        Skin    Rashes or ulcers:        Constitutional    Fever or chills:     PHYSICAL EXAM:   Vitals:   09/03/20 1109  BP: 103/64  Pulse: 77  Resp: 20  Temp: 98.1 F (36.7 C)  SpO2: 94%  Weight: 240 lb 15.4 oz (109.3 kg)  Height: 5' 2" (1.575 m)   Body mass index is 44.07 kg/m.  GENERAL: The patient is a well-nourished female, in no acute distress. The vital signs are documented above. CARDIAC: There is a regular rate and rhythm.  VASCULAR: I do not detect carotid bruits. She has palpable radial pulses. PULMONARY: There is good air exchange bilaterally without wheezing or rales. ABDOMEN: Soft and non-tender with normal pitched bowel sounds.  MUSCULOSKELETAL: There are no major deformities or cyanosis. NEUROLOGIC: No focal weakness or paresthesias are detected. SKIN: There are no ulcers or rashes noted. PSYCHIATRIC: The patient has a normal affect.  DATA:    UPPER EXTREMITY VEIN MAP: I have independently interpreted her upper extremity vein map.  On the right side, the forearm cephalic vein is not adequate.  The upper arm cephalic vein is marginal in size.  The basilic vein on the right looks reasonable in size.  On the left side, the forearm and upper arm cephalic vein do not look reasonable in size.  The basilic vein looks reasonable in size.  UPPER EXTREMITY ARTERIAL DUPLEX: I have independently interpreted her upper extremity arterial duplex scan.  On the right side there is a triphasic radial and ulnar waveform.  The brachial artery measures 0.46 cm in maximum diameter.  On the left side there is a triphasic radial and ulnar waveform.  The brachial artery measures 0.43 cm in diameter.  LABS: GFR on 07/28/2020 was 8.3.   

## 2020-09-03 NOTE — Telephone Encounter (Signed)
Called pt to confirm pharmacy prior to refilling prescription.  Will try again when pt home b/c sister is unsure.

## 2020-09-03 NOTE — Progress Notes (Signed)
REASON FOR CONSULT:    To evaluate for hemodialysis access.  The patient is referred by Dr. Corliss Parish.  ASSESSMENT & PLAN:   END-STAGE RENAL DISEASE: This patient has end-stage renal disease.  She dialyzes Tuesdays Thursdays and Saturdays.  She is right-handed.  She does not have a pacemaker.  She is not on any blood thinners.  Based on her vein map she may be a candidate for right brachiocephalic fistula.  The second option on the right would be a basilic vein transposition.  I explained that if this is the case we oftentimes do this in 2 stages.  If neither were possible we would place an AV graft.  The veins in the left arm were somewhat smaller which is widely selected the right side.  I have explained the indications for placement of an AV fistula or AV graft. I've explained that if at all possible we will place an AV fistula.  I have reviewed the risks of placement of an AV fistula including but not limited to: failure of the fistula to mature, need for subsequent interventions, and thrombosis. In addition I have reviewed the potential complications of placement of an AV graft. These risks include, but are not limited to, graft thrombosis, graft infection, wound healing problems, bleeding, arm swelling, and steal syndrome. All the patient's questions were answered and they are agreeable to proceed with surgery.   Deitra Mayo, MD Office: [phone]   HPI:   [name] is a pleasant 69 y.o. female, with stage V chronic kidney disease.  She is referred for hemodialysis access.  We have been asked to place access and also a tunneled dialysis catheter.  I have reviewed the records from the referring office.  The patient has stage V chronic kidney disease.  She has well-controlled hypertension and diabetes.  Her renal function has deteriorated and we have been asked to provide hemodialysis access.  The patient dialyzes on Tuesdays Thursdays and Saturdays.  She has a  functioning right IJ tunneled dialysis catheter that was placed by [facility].  She is right-handed.  She does not have a pacemaker.  She is not on any blood thinners.  She denies any recent uremic symptoms.  Specifically, she denies nausea, vomiting, fatigue, anorexia, or palpitations.  She has no significant cardiac history.  She denies any history of myocardial infarction or history congestive heart failure.  Her chronic kidney disease I believe is secondary to diabetes and hypertension.  Past Medical History:  Diagnosis Date  . Anemia   . Anxiety   . Colitis 2010   microscopic- Dr Henrene Pastor  . Depression   . Diabetes mellitus    type II  . GERD (gastroesophageal reflux disease)   . Headache   . Hypertension   . IBS (irritable bowel syndrome)   . LBP (low back pain)   . Neuropathy    feet bilat   . Osteoarthritis   . Osteopenia   . Pneumonia    hx of 2014   . Renal insufficiency 2011  . Sinusitis    currently being treated with antibiotic will complete 03/04/2015    Family History  Problem Relation Age of Onset  . Depression Mother   . Hypertension Mother   . Stroke Mother 39  . Diabetes Mother   . Diabetes Father   . Hyperlipidemia Father   . Hypertension Father   . Crohn's disease Maternal Uncle   . Diabetes Other   . Cancer Brother  Prostate  . Diabetes Brother   . Heart disease Brother   . Hyperlipidemia Brother   . Hypertension Brother   . Diabetes Brother   . Heart disease Brother        Heart Disease before age 19  . Heart attack Brother   . Stroke Brother        X's 2  . Hyperlipidemia Brother   . Hypertension Brother   . Colon cancer Neg Hx     SOCIAL HISTORY: Social History   Socioeconomic History  . Marital status: Single    Spouse name: Not on file  . Number of children: Not on file  . Years of education: Not on file  . Highest education level: Not on file  Occupational History  . Not on file  Tobacco Use  . Smoking status:  Former Smoker    Packs/day: 1.00    Years: 20.00    Pack years: 20.00    Types: Cigarettes    Quit date: 12/13/1988    Years since quitting: 31.7  . Smokeless tobacco: Never Used  Vaping Use  . Vaping Use: Never used  Substance and Sexual Activity  . Alcohol use: Yes    Alcohol/week: 0.0 standard drinks    Comment: rarely  . Drug use: No  . Sexual activity: Not on file  Other Topics Concern  . Not on file  Social History Narrative  . Not on file   Social Determinants of Health   Financial Resource Strain:   . Difficulty of Paying Living Expenses: Not on file  Food Insecurity:   . Worried About [name] fundraiser in the Last Year: Not on file  . Ran Out of Food in the Last Year: Not on file  Transportation Needs:   . Lack of Transportation (Medical): Not on file  . Lack of Transportation (Non-Medical): Not on file  Physical Activity:   . Days of Exercise per Week: Not on file  . Minutes of Exercise per Session: Not on file  Stress:   . Feeling of Stress : Not on file  Social Connections:   . Frequency of Communication with Friends and Family: Not on file  . Frequency of Social Gatherings with Friends and Family: Not on file  . Attends Religious Services: Not on file  . Active Member of Clubs or Organizations: Not on file  . Attends Archivist Meetings: Not on file  . Marital Status: Not on file  Intimate Partner Violence:   . Fear of Current or Ex-Partner: Not on file  . Emotionally Abused: Not on file  . Physically Abused: Not on file  . Sexually Abused: Not on file    Allergies  Allergen Reactions  . Erythromycin Diarrhea and Nausea And Vomiting    Current Outpatient Medications  Medication Sig Dispense Refill  . amoxicillin (AMOXIL) 500 MG capsule TAKE 4 CAPSULES BY MOUTH 1  HOUR PRIOR TO DENTAL  PROCEDURE/CLEANING 16 capsule 1  . blood glucose meter kit and supplies Dispense based on patient and insurance preference. Used to check blood sugar  daily, DX: E11.9 OneTouch 1 each 0  . buPROPion (WELLBUTRIN XL) 300 MG 24 hr tablet TAKE 1 TABLET BY MOUTH  DAILY 90 tablet 3  . carvedilol (COREG) 25 MG tablet TAKE ONE-HALF TABLET BY  MOUTH TWICE A DAY WITH A  MEAL 90 tablet 3  . cyclobenzaprine (FLEXERIL) 10 MG tablet TAKE 3 TABLETS BY MOUTH AT  BEDTIME 270 tablet 1  .  dicyclomine (BENTYL) 20 MG tablet Take one tablet every 4-6 hours as needed for abdominal pain 60 tablet 2  . DULoxetine (CYMBALTA) 60 MG capsule TAKE 1 CAPSULE BY MOUTH AT  BEDTIME 90 capsule 3  . fexofenadine (ALLEGRA) 180 MG tablet Take 1 tablet (180 mg total) by mouth daily. 90 tablet 3  . glimepiride (AMARYL) 1 MG tablet TAKE 1 TABLET BY MOUTH  DAILY BEFORE BREAKFAST 90 tablet 3  . glucose blood (TRUETEST TEST) test strip Used to check blood sugar daily, DX: E11.9 100 each 3  . ketoconazole (NIZORAL) 200 MG tablet Take 0.5 tablets (100 mg total) by mouth daily. 20 tablet 2  . Lancets (ONETOUCH ULTRASOFT) lancets Used to check blood sugar daily, DX: E11.9 100 each 3  . lidocaine (XYLOCAINE) 5 % ointment Apply topically 3 (three) times daily as needed. 100 g 3  . LORazepam (ATIVAN) 2 MG tablet 1 po qhs prn insomnia 90 tablet 2  . multivitamin (RENA-VIT) TABS tablet Take 1 tablet by mouth daily.    . Omega-3 300 MG CAPS Take 300 mg by mouth at bedtime.    Marland Kitchen oxyCODONE (ROXICODONE) 15 MG immediate release tablet Take 1 tablet (15 mg total) by mouth 4 (four) times daily. 120 tablet 0  . oxyCODONE (ROXICODONE) 15 MG immediate release tablet Take 1 tablet (15 mg total) by mouth 4 (four) times daily. 120 tablet 0  . oxyCODONE (ROXICODONE) 15 MG immediate release tablet Take 1 tablet (15 mg total) by mouth every 6 (six) hours as needed for pain. 120 tablet 0  . pantoprazole (PROTONIX) 40 MG tablet TAKE 1 TABLET BY MOUTH  DAILY 90 tablet 3  . pilocarpine (SALAGEN) 5 MG tablet TAKE 1 TABLET BY MOUTH TWO  TIMES DAILY 180 tablet 3  . pioglitazone (ACTOS) 15 MG tablet TAKE 1 TABLET BY  MOUTH  DAILY 90 tablet 3  . polyvinyl alcohol (LIQUIFILM TEARS) 1.4 % ophthalmic solution Place 1 drop into both eyes daily as needed for dry eyes.    . PSYLLIUM PO Take 1 capsule by mouth 2 (two) times daily.    . repaglinide (PRANDIN) 1 MG tablet Take 1 mg by mouth 3 (three) times daily.    . solifenacin (VESICARE) 5 MG tablet TAKE 1 TABLET BY MOUTH  DAILY 90 tablet 3  . STOOL SOFTENER 100 MG capsule Take 100 mg by mouth daily.    Marland Kitchen triamcinolone (NASACORT) 55 MCG/ACT AERO nasal inhaler Place 2 sprays into the nose daily. 3 each 3  . gabapentin (NEURONTIN) 100 MG capsule Take 100 mg by mouth daily.  (Patient not taking: Reported on 09/03/2020)    . Multiple Vitamin (MULTIVITAMIN WITH MINERALS) TABS tablet Take 1 tablet by mouth daily. (Patient not taking: Reported on 09/03/2020)    . torsemide (DEMADEX) 100 MG tablet Take 1 tablet (100 mg total) by mouth daily. (Patient not taking: Reported on 09/03/2020) 90 tablet 3   No current facility-administered medications for this visit.    REVIEW OF SYSTEMS:  _0  denotes positive finding, _1  denotes negative finding Cardiac  Comments:  Chest pain or chest pressure:    Shortness of breath upon exertion: x   Short of breath when lying flat:    Irregular heart rhythm:        Vascular    Pain in calf, thigh, or hip brought on by ambulation:    Pain in feet at night that wakes you up from your sleep:     Blood clot in  your veins:    Leg swelling:         Pulmonary    Oxygen at home:    Productive cough:     Wheezing:         Neurologic    Sudden weakness in arms or legs:     Sudden numbness in arms or legs:     Sudden onset of difficulty speaking or slurred speech:    Temporary loss of vision in one eye:     Problems with dizziness:         Gastrointestinal    Blood in stool:     Vomited blood:         Genitourinary    Burning when urinating:     Blood in urine:        Psychiatric    Major depression:  x       Hematologic      Bleeding problems:    Problems with blood clotting too easily:        Skin    Rashes or ulcers:        Constitutional    Fever or chills:     PHYSICAL EXAM:   Vitals:   09/03/20 1109  BP: 103/64  Pulse: 77  Resp: 20  Temp: 98.1 F (36.7 C)  SpO2: 94%  Weight: 240 lb 15.4 oz (109.3 kg)  Height: _0  (1.575 m)   Body mass index is 44.07 kg/m.  GENERAL: The patient is a well-nourished female, in no acute distress. The vital signs are documented above. CARDIAC: There is a regular rate and rhythm.  VASCULAR: I do not detect carotid bruits. She has palpable radial pulses. PULMONARY: There is good air exchange bilaterally without wheezing or rales. ABDOMEN: Soft and non-tender with normal pitched bowel sounds.  MUSCULOSKELETAL: There are no major deformities or cyanosis. NEUROLOGIC: No focal weakness or paresthesias are detected. SKIN: There are no ulcers or rashes noted. PSYCHIATRIC: The patient has a normal affect.  DATA:    UPPER EXTREMITY VEIN MAP: I have independently interpreted her upper extremity vein map.  On the right side, the forearm cephalic vein is not adequate.  The upper arm cephalic vein is marginal in size.  The basilic vein on the right looks reasonable in size.  On the left side, the forearm and upper arm cephalic vein do not look reasonable in size.  The basilic vein looks reasonable in size.  UPPER EXTREMITY ARTERIAL DUPLEX: I have independently interpreted her upper extremity arterial duplex scan.  On the right side there is a triphasic radial and ulnar waveform.  The brachial artery measures 0.46 cm in maximum diameter.  On the left side there is a triphasic radial and ulnar waveform.  The brachial artery measures 0.43 cm in diameter.  LABS: GFR on 07/28/2020 was 8.3.

## 2020-09-04 DIAGNOSIS — G8929 Other chronic pain: Secondary | ICD-10-CM | POA: Diagnosis not present

## 2020-09-04 DIAGNOSIS — Z992 Dependence on renal dialysis: Secondary | ICD-10-CM | POA: Diagnosis not present

## 2020-09-04 DIAGNOSIS — D688 Other specified coagulation defects: Secondary | ICD-10-CM | POA: Diagnosis not present

## 2020-09-04 DIAGNOSIS — E1129 Type 2 diabetes mellitus with other diabetic kidney complication: Secondary | ICD-10-CM | POA: Diagnosis not present

## 2020-09-04 DIAGNOSIS — N186 End stage renal disease: Secondary | ICD-10-CM | POA: Diagnosis not present

## 2020-09-04 DIAGNOSIS — D631 Anemia in chronic kidney disease: Secondary | ICD-10-CM | POA: Diagnosis not present

## 2020-09-04 DIAGNOSIS — N2581 Secondary hyperparathyroidism of renal origin: Secondary | ICD-10-CM | POA: Diagnosis not present

## 2020-09-05 ENCOUNTER — Telehealth: Payer: Self-pay | Admitting: Internal Medicine

## 2020-09-05 MED ORDER — TRUETEST TEST VI STRP: ORAL_STRIP | 3 refills | Status: DC

## 2020-09-05 NOTE — Telephone Encounter (Signed)
Pt is requesting a call back from a nurse to discuss her left lower abdominal pain. Pt would like to know what to do.

## 2020-09-05 NOTE — Telephone Encounter (Signed)
See 09/05/20 med refill

## 2020-09-05 NOTE — Telephone Encounter (Signed)
Called home number back and there was no answer. Called and left message on mobile number for pt to call back.

## 2020-09-06 DIAGNOSIS — E1129 Type 2 diabetes mellitus with other diabetic kidney complication: Secondary | ICD-10-CM | POA: Diagnosis not present

## 2020-09-06 DIAGNOSIS — D688 Other specified coagulation defects: Secondary | ICD-10-CM | POA: Diagnosis not present

## 2020-09-06 DIAGNOSIS — D631 Anemia in chronic kidney disease: Secondary | ICD-10-CM | POA: Diagnosis not present

## 2020-09-06 DIAGNOSIS — Z992 Dependence on renal dialysis: Secondary | ICD-10-CM | POA: Diagnosis not present

## 2020-09-06 DIAGNOSIS — N2581 Secondary hyperparathyroidism of renal origin: Secondary | ICD-10-CM | POA: Diagnosis not present

## 2020-09-06 DIAGNOSIS — G8929 Other chronic pain: Secondary | ICD-10-CM | POA: Diagnosis not present

## 2020-09-06 DIAGNOSIS — N186 End stage renal disease: Secondary | ICD-10-CM | POA: Diagnosis not present

## 2020-09-08 ENCOUNTER — Encounter (HOSPITAL_COMMUNITY): Payer: Self-pay

## 2020-09-08 ENCOUNTER — Other Ambulatory Visit: Payer: Medicare Other

## 2020-09-08 ENCOUNTER — Telehealth: Payer: Self-pay | Admitting: Internal Medicine

## 2020-09-08 ENCOUNTER — Other Ambulatory Visit: Payer: Self-pay

## 2020-09-08 ENCOUNTER — Ambulatory Visit (HOSPITAL_COMMUNITY)
Admission: EM | Admit: 2020-09-08 | Discharge: 2020-09-08 | Disposition: A | Discharge status: Home / Self Care | Payer: Medicare Other | Attending: Family Medicine | Admitting: Family Medicine

## 2020-09-08 DIAGNOSIS — R1032 Left lower quadrant pain: Secondary | ICD-10-CM

## 2020-09-08 MED FILL — LORazepam 2 MG TABS: 2 | 90 days supply | Qty: 90 | Fill #0

## 2020-09-08 MED FILL — oxyCODONE HCL 15 MG TABS: 15 | 30 days supply | Qty: 120 | Fill #0

## 2020-09-08 NOTE — Telephone Encounter (Signed)
error 

## 2020-09-08 NOTE — ED Triage Notes (Signed)
Pt states she started having abdominal pain during dialysis on Thursday. Pt states she was unable to walk due to pain. Pain has improved but not substantially. Pt states she has not had any appetite. Pt is aox4 and ambualtes with some difficulty/pain.

## 2020-09-08 NOTE — Telephone Encounter (Signed)
    Patient calling to report severe abdominal pain for several days No other symptoms Patient waiting on call back from [place] Appointment 9/29, patient declined Tuesday appointment due to dialysis.   Call Transferred to [facility]

## 2020-09-08 NOTE — ED Provider Notes (Signed)
[place]    CSN: [id number] Arrival date & time: 09/08/20  1731      History   Chief Complaint Chief Complaint  Patient presents with  . Abdominal Pain    since thursday    HPI [name] is a 69 y.o. female history of CKD, GERD, DM type II, presenting today for evaluation of abdominal pain.  Patient reports that last Thursday while at dialysis she began to develop a crampy sensation in her left lower quadrant.  Symptoms worsened Friday and caused her to double over and unable to walk.  Pain has slightly subsided since, but continues to have pain in the left lower area.  She denies associated nausea or vomiting.  She has had poor appetite.  She ate a small amount yesterday.  Bowel movements have been regular, took 2 doses of MiraLAX as she has history of constipation and has had 2 bowel movements since with out improvement of pain.  She denies diarrhea or any bloody stools.  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  Denies history of diverticulitis, but has had diverticulosis noted on prior colonoscopies.  She has plans to have fistula placed on Friday and is concerned about her abdominal pain interfering with this.  She has used Bentyl which is eased off pain slightly as well as oxycodone which she has prescribed to her.  She expresses concern of possibility of compromised blood flow to colonAs cause of pain.   HPI  Past Medical History:  Diagnosis Date  . Anemia   . Anxiety   . Colitis 2010   microscopic- Dr Henrene Pastor  . Depression   . Diabetes mellitus    type II  . GERD (gastroesophageal reflux disease)   . Headache   . Hypertension   . IBS (irritable bowel syndrome)   . LBP (low back pain)   . Neuropathy    feet bilat   . Osteoarthritis   . Osteopenia   . Pneumonia    hx of 2014   . Renal insufficiency 2011  . Sinusitis    currently being treated with antibiotic will complete 03/04/2015     Patient Active Problem List   Diagnosis Date Noted  . Allergic rhinitis 08/14/2020  . Cough 08/14/2020  . Memory problem 07/28/2020  . Other osteoporosis without current pathological fracture 05/21/2020  . Confusion and disorientation 04/16/2020  . DDD (degenerative disc disease), cervical 01/14/2020  . Left elbow pain 10/17/2019  . Pain in joint of right hip 09/12/2019  . Lumbar spondylosis with myelopathy 09/12/2019  . Intertrigo 04/16/2019  . Tendinitis of left wrist 01/24/2019  . Chest wall contusion, left, initial encounter 04/24/2018  . Full thickness rotator cuff tear 04/14/2018  . Falls frequently 03/20/2018  . Neck pain 02/08/2018  . Pain in joint of right shoulder 01/20/2018  . CKD (chronic kidney disease) 10/13/2017  . 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 OA (osteoarthritis) of knee 03/05/2015  . Rib pain on left side 02/27/2015  . URI, acute 09/17/2013  . Swelling of limb-Bilateral leg Left > than right 07/11/2013  . Numbness-Left foot 07/11/2013  . Chronic venous insufficiency 04/19/2013  . Left hand pain 02/09/2013  . Right shoulder pain 09/12/2012  . Edema 04/24/2012  . Grief 11/10/2011  . ABSCESS, TOOTH 02/03/2011  . OSTEOPENIA 10/28/2010  . Fever 08/06/2010  . HYPERKALEMIA 07/15/2010  . ARTHRALGIA 04/08/2010  . Fatigue 04/08/2010  . XEROSTOMIA 02/04/2010  .  ECZEMA 10/29/2009  . TOBACCO USE, QUIT 10/29/2009  . Anemia 06/18/2009  . 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 Diarrhea 06/18/2009  . OPACITY, VITREOUS HUMOR 01/15/2009  . COLITIS 10/16/2008  . KNEE PAIN 04/10/2008  . HYPERCHOLESTEROLEMIA 01/10/2008  . Anxiety state 11/08/2007  . Depression 11/08/2007  . Osteoarthritis 11/08/2007  . LOW BACK PAIN 11/08/2007  . Diabetes mellitus type 2, controlled ([place]) 10/07/2007  . Essential hypertension 10/07/2007  . GERD 10/07/2007  . MICROALBUMINURIA 10/07/2007    Past Surgical History:  Procedure Laterality  Date  . foramen magnum ependymoma surgery  2003   Dr Rita Ohara  . JOINT REPLACEMENT Bilateral   . NASAL SINUS SURGERY     1973   . TONSILLECTOMY    . TOTAL KNEE ARTHROPLASTY     L 2008, R 2009, R 2016- Dr Maureen Ralphs  . TOTAL KNEE REVISION Right 03/05/2015   Procedure: RIGHT TOTAL KNEE ARTHROPLASTY REVISION;  Surgeon: Gaynelle Arabian, MD;  Location: [facility];  Service: Orthopedics;  Laterality: Right;    OB History   No obstetric history on file.      Home Medications    Prior to Admission medications   Medication Sig Start Date End Date Taking? Authorizing Provider  acetaminophen (TYLENOL) 500 MG tablet Take 1,000 mg by mouth every 6 (six) hours as needed for moderate pain or headache.    [provider]  amoxicillin (AMOXIL) 500 MG capsule TAKE 4 CAPSULES BY MOUTH 1  HOUR PRIOR TO DENTAL  PROCEDURE/CLEANING Patient taking differently: Take 2,000 mg by mouth See admin instructions. Take 2000 mg 1 hour prior to dental work 04/16/20   Plotnikov, Evie Lacks, MD  blood glucose meter kit and supplies Dispense based on patient and insurance preference. Used to check blood sugar daily, DX: E11.9 OneTouch 12/20/17   Plotnikov, Evie Lacks, MD  buPROPion (WELLBUTRIN XL) 300 MG 24 hr tablet TAKE 1 TABLET BY MOUTH  DAILY Patient taking differently: Take 300 mg by mouth at bedtime.  11/06/19   Plotnikov, Evie Lacks, MD  carvedilol (COREG) 25 MG tablet TAKE ONE-HALF TABLET BY  MOUTH TWICE A DAY WITH A  MEAL Patient not taking: Reported on 09/05/2020 10/30/19   Plotnikov, Evie Lacks, MD  cyclobenzaprine (FLEXERIL) 10 MG tablet TAKE 3 TABLETS BY MOUTH AT  BEDTIME Patient taking differently: Take 10 mg by mouth 3 (three) times daily as needed for muscle spasms.  02/19/20   Plotnikov, Evie Lacks, MD  dicyclomine (BENTYL) 20 MG tablet Take one tablet every 4-6 hours as needed for abdominal pain Patient taking differently: Take 20 mg by mouth every 4 (four) hours as needed (abdominal pain).  01/30/20   Irene Shipper, MD  DULoxetine (CYMBALTA) 60 MG capsule TAKE 1 CAPSULE BY MOUTH AT  BEDTIME Patient taking differently: Take 60 mg by mouth at bedtime. ** Do NOT chew, crush, or open capsule ** 11/14/19   Plotnikov, Evie Lacks, MD  fexofenadine (ALLEGRA) 180 MG tablet Take 1 tablet (180 mg total) by mouth daily. Patient taking differently: Take 180 mg by mouth daily as needed for allergies.  08/14/20 08/14/21  Biagio Borg, MD  glimepiride (AMARYL) 1 MG tablet TAKE 1 TABLET BY MOUTH  DAILY BEFORE BREAKFAST Patient taking differently: Take 1 mg by mouth daily before breakfast.  08/21/20   Plotnikov, Evie Lacks, MD  glucose blood (TRUETEST TEST) test strip Used to check blood sugar daily, DX: E11.9 09/05/20   Plotnikov, Evie Lacks, MD  ketoconazole (NIZORAL)  200 MG tablet Take 0.5 tablets (100 mg total) by mouth daily. 04/16/19   Plotnikov, Evie Lacks, MD  Lancets [facility] ULTRASOFT) lancets Used to check blood sugar daily, DX: E11.9 12/20/17   Plotnikov, Evie Lacks, MD  lidocaine (XYLOCAINE) 5 % ointment Apply topically 3 (three) times daily as needed. Patient taking differently: Apply 1 application topically 3 (three) times daily as needed for moderate pain.  01/16/19   Plotnikov, Evie Lacks, MD  LORazepam (ATIVAN) 2 MG tablet 1 po qhs prn insomnia Patient taking differently: Take 1-2 mg by mouth every 6 (six) hours as needed for anxiety or sleep.  07/28/20   Plotnikov, Evie Lacks, MD  Menthol-Methyl Salicylate (SALONPAS PAIN RELIEF PATCH EX) Apply 2 patches topically daily as needed (shoulder pain).    [provider]  multivitamin (RENA-VIT) TABS tablet Take 1 tablet by mouth daily. 08/28/20   [provider]  oxyCODONE (ROXICODONE) 15 MG immediate release tablet Take 1 tablet (15 mg total) by mouth every 6 (six) hours as needed for pain. 07/28/20   Plotnikov, Evie Lacks, MD  pantoprazole (PROTONIX) 40 MG tablet TAKE 1 TABLET BY MOUTH  DAILY Patient taking differently: Take 40 mg by mouth daily.  10/30/19    Plotnikov, Evie Lacks, MD  pilocarpine (SALAGEN) 5 MG tablet TAKE 1 TABLET BY MOUTH TWO  TIMES DAILY Patient taking differently: Take 5 mg by mouth 2 (two) times daily.  11/12/19   Plotnikov, Evie Lacks, MD  pioglitazone (ACTOS) 15 MG tablet TAKE 1 TABLET BY MOUTH  DAILY Patient taking differently: Take 15 mg by mouth daily.  05/05/20   Plotnikov, Evie Lacks, MD  polyvinyl alcohol (LIQUIFILM TEARS) 1.4 % ophthalmic solution Place 1 drop into both eyes daily as needed for dry eyes.    [provider]  PSYLLIUM PO Take 1 Dose by mouth daily.     [provider]  repaglinide (PRANDIN) 1 MG tablet Take 1 mg by mouth 3 (three) times daily. 08/28/20   [provider]  solifenacin (VESICARE) 5 MG tablet TAKE 1 TABLET BY MOUTH  DAILY Patient taking differently: Take 5 mg by mouth daily.  07/07/20   Plotnikov, Evie Lacks, MD  STOOL SOFTENER 100 MG capsule Take 100 mg by mouth daily. 08/28/20   [provider]  torsemide (DEMADEX) 100 MG tablet Take 1 tablet (100 mg total) by mouth daily. Patient not taking: Reported on 09/03/2020 11/28/19   Plotnikov, Evie Lacks, MD  triamcinolone (NASACORT) 55 MCG/ACT AERO nasal inhaler Place 2 sprays into the nose daily. Patient taking differently: Place 2 sprays into the nose daily as needed (allergies).  08/14/20   Biagio Borg, MD    Family History Family History  Problem Relation Age of Onset  . Depression Mother   . Hypertension Mother   . Stroke Mother 63  . Diabetes Mother   . Diabetes Father   . Hyperlipidemia Father   . Hypertension Father   . Crohn's disease Maternal Uncle   . Diabetes Other   . Cancer Brother        Prostate  . Diabetes Brother   . Heart disease Brother   . Hyperlipidemia Brother   . Hypertension Brother   . Diabetes Brother   . Heart disease Brother        Heart Disease before age 17  . Heart attack Brother   . Stroke Brother        X's 2  . Hyperlipidemia Brother   . Hypertension Brother   .  Colon cancer Neg Hx     Social History Social History   Tobacco Use  . Smoking status: Former Smoker    Packs/day: 1.00    Years: 20.00    Pack years: 20.00    Types: Cigarettes    Quit date: 12/13/1988    Years since quitting: 31.7  . Smokeless tobacco: Never Used  Vaping Use  . Vaping Use: Never used  Substance Use Topics  . Alcohol use: Yes    Alcohol/week: 0.0 standard drinks    Comment: rarely  . Drug use: No     Allergies   Erythromycin and Gabapentin   Review of Systems Review of Systems  Constitutional: Negative for activity change, appetite change, chills, fatigue and fever.  HENT: Negative for congestion, ear pain, rhinorrhea, sinus pressure, sore throat and trouble swallowing.   Eyes: Negative for discharge and redness.  Respiratory: Negative for cough, chest tightness and shortness of breath.   Cardiovascular: Negative for chest pain.  Gastrointestinal: Positive for abdominal pain. Negative for diarrhea, nausea and vomiting.  Musculoskeletal: Negative for myalgias.  Skin: Negative for rash.  Neurological: Negative for dizziness, light-headedness and headaches.     Physical Exam Triage Vital Signs ED Triage Vitals  Enc Vitals Group     BP 09/08/20 1844 (!) 129/93     Pulse Rate 09/08/20 1844 87     Resp 09/08/20 1844 19     Temp --      Temp Source 09/08/20 1844 Oral     SpO2 09/08/20 1844 98 %     Weight --      Height --      Head Circumference --      Peak Flow --      Pain Score 09/08/20 1847 6     Pain Loc --      Pain Edu? --      Excl. in [place]? --    No data found.  Updated Vital Signs BP (!) 129/93 (BP Location: Right Arm)   Pulse 87   Resp 19   SpO2 98%   Visual Acuity Right Eye Distance:   Left Eye Distance:   Bilateral Distance:    Right Eye Near:   Left Eye Near:    Bilateral Near:     Physical Exam Vitals and nursing note reviewed.  Constitutional:      Appearance: She is well-developed.     Comments: No acute  distress  HENT:     Head: Normocephalic and atraumatic.     Nose: Nose normal.  Eyes:     Conjunctiva/sclera: Conjunctivae normal.  Cardiovascular:     Rate and Rhythm: Normal rate.  Pulmonary:     Effort: Pulmonary effort is normal. No respiratory distress.     Comments: Breathing comfortably at rest, CTABL, no wheezing, rales or other adventitious sounds auscultated Abdominal:     General: There is no distension.     Tenderness: There is abdominal tenderness.     Comments: Soft, nondistended, nontender to palpation to right upper and lower quadrants, mild tenderness to mid upper abdomen extending towards left, more focal in left lower quadrant, negative rebound, negative McBurney's, negative Rovsing  Musculoskeletal:        General: Normal range of motion.     Cervical back: Neck supple.  Skin:    General: Skin is warm and dry.  Neurological:     Mental Status: She is alert and oriented to person, place, and time.  UC Treatments / Results  Labs (all labs ordered are listed, but only abnormal results are displayed) Labs Reviewed - No data to display  EKG   Radiology No results found.  Procedures Procedures (including critical care time)  Medications Ordered in UC Medications - No data to display  Initial Impression / Assessment and Plan / UC Course  I have reviewed the triage vital signs and the nursing notes.  Pertinent labs & imaging results that were available during my care of the patient were reviewed by me and considered in my medical decision making (see chart for details).     Left lower quadrant pain, discussed with patient given her concern of ischemia discussed that this cannot be ruled out without CT scanning.  Did recommend to follow-up in emergency room for CT scanning to evaluate for possible diverticulitis versus other abdominal emergency room given severity of pain described.  Patient expresses hesitancy given wait times and concerned about  being exposed to Covid.  Discussed other options of monitoring and managing pain as she does have follow-up appointment with PCP in 2 days. Offered Xray, low suspicion of obstruction, patient declined.  Patient opted for this, expressed understanding to follow-up in the emergency room for further imaging if any symptoms not improving or worsening.  Discussed strict return precautions. Patient verbalized understanding and is agreeable with plan.  Final Clinical Impressions(s) / UC Diagnoses   Final diagnoses:  Left lower quadrant abdominal pain     Discharge Instructions     Continue bentyl as needed Oxycodone as prescribed Continue miralax as needed Follow up in emergency room if worsening for imaging    ED Prescriptions    None     PDMP not reviewed this encounter.   Janith Lima, PA-C 09/09/20 1227

## 2020-09-08 NOTE — Telephone Encounter (Signed)
Pt is requesting a call back from a nurse on her mobile.

## 2020-09-08 NOTE — Discharge Instructions (Signed)
Continue bentyl as needed Oxycodone as prescribed Continue miralax as needed Follow up in emergency room if worsening for imaging

## 2020-09-09 DIAGNOSIS — D631 Anemia in chronic kidney disease: Secondary | ICD-10-CM | POA: Diagnosis not present

## 2020-09-09 DIAGNOSIS — N186 End stage renal disease: Secondary | ICD-10-CM | POA: Diagnosis not present

## 2020-09-09 DIAGNOSIS — N2581 Secondary hyperparathyroidism of renal origin: Secondary | ICD-10-CM | POA: Diagnosis not present

## 2020-09-09 DIAGNOSIS — G8929 Other chronic pain: Secondary | ICD-10-CM | POA: Diagnosis not present

## 2020-09-09 DIAGNOSIS — E1129 Type 2 diabetes mellitus with other diabetic kidney complication: Secondary | ICD-10-CM | POA: Diagnosis not present

## 2020-09-09 DIAGNOSIS — D688 Other specified coagulation defects: Secondary | ICD-10-CM | POA: Diagnosis not present

## 2020-09-09 DIAGNOSIS — Z992 Dependence on renal dialysis: Secondary | ICD-10-CM | POA: Diagnosis not present

## 2020-09-09 NOTE — Telephone Encounter (Signed)
[facility] Report/Call : ---Caller states that she is calling on behalf of her uncle who fell and was on the floor yesterday for what she believed to be overnight. Slid off the bed this morning and was stuck between the bed and wall for 2 1/2 hours and now he is having weakness in his legs. States he has a catheter and it was completely full when found. She doesn't believe he has broken anything. He also has other symptoms going on such as diarrhea and weight loss. Urine is cloudy yellow. No fever.  Advised see PCP within 4 hours. Appointment has been made.

## 2020-09-10 ENCOUNTER — Other Ambulatory Visit
Admission: RE | Admit: 2020-09-10 | Discharge: 2020-09-10 | Disposition: A | Discharge status: Home / Self Care | Payer: Medicare Other | Source: Ambulatory Visit | Attending: Vascular Surgery | Admitting: Vascular Surgery

## 2020-09-10 ENCOUNTER — Other Ambulatory Visit: Payer: Self-pay

## 2020-09-10 ENCOUNTER — Telehealth: Payer: Medicare Other | Admitting: Internal Medicine

## 2020-09-10 ENCOUNTER — Encounter (HOSPITAL_COMMUNITY): Payer: Self-pay | Admitting: Vascular Surgery

## 2020-09-10 DIAGNOSIS — Z01812 Encounter for preprocedural laboratory examination: Secondary | ICD-10-CM | POA: Insufficient documentation

## 2020-09-10 DIAGNOSIS — Z20822 Contact with and (suspected) exposure to covid-19: Secondary | ICD-10-CM | POA: Diagnosis not present

## 2020-09-10 LAB — SARS CORONAVIRUS 2 (TAT 6-24 HRS): SARS Coronavirus 2: NEGATIVE

## 2020-09-10 NOTE — Progress Notes (Signed)
Patient was given pre-op instructions over the phone. The opportunity was given for the patient to ask questions. No further questions asked. Patient verbalized understanding of instructions given.   Nothing to eat or drink after midnight.  7 days prior to surgery STOP taking any Aspirin (unless otherwise instructed by your surgeon), Aleve, Naproxen, Ibuprofen, Motrin, Advil, Goody's, BC's, all herbal medications, fish oil, and all vitamins.   Anesthesia Review Needed: yes, anesthesia notified

## 2020-09-11 DIAGNOSIS — D688 Other specified coagulation defects: Secondary | ICD-10-CM | POA: Diagnosis not present

## 2020-09-11 DIAGNOSIS — Z992 Dependence on renal dialysis: Secondary | ICD-10-CM | POA: Diagnosis not present

## 2020-09-11 DIAGNOSIS — G8929 Other chronic pain: Secondary | ICD-10-CM | POA: Diagnosis not present

## 2020-09-11 DIAGNOSIS — E1129 Type 2 diabetes mellitus with other diabetic kidney complication: Secondary | ICD-10-CM | POA: Diagnosis not present

## 2020-09-11 DIAGNOSIS — N186 End stage renal disease: Secondary | ICD-10-CM | POA: Diagnosis not present

## 2020-09-11 DIAGNOSIS — N2581 Secondary hyperparathyroidism of renal origin: Secondary | ICD-10-CM | POA: Diagnosis not present

## 2020-09-11 DIAGNOSIS — D631 Anemia in chronic kidney disease: Secondary | ICD-10-CM | POA: Diagnosis not present

## 2020-09-11 DIAGNOSIS — E1022 Type 1 diabetes mellitus with diabetic chronic kidney disease: Secondary | ICD-10-CM | POA: Diagnosis not present

## 2020-09-11 NOTE — Telephone Encounter (Signed)
Left message for pt to call back on her mobile number.

## 2020-09-11 NOTE — Addendum Note (Signed)
Addended by: Nicholas Lose on: 09/11/2020 02:32 PM   Modules accepted: Orders

## 2020-09-11 NOTE — Telephone Encounter (Signed)
Patient is returning your call.  

## 2020-09-12 ENCOUNTER — Encounter (HOSPITAL_COMMUNITY)
Admission: RE | Disposition: A | Discharge status: Home / Self Care | Payer: Self-pay | Source: Home / Self Care | Attending: Vascular Surgery

## 2020-09-12 ENCOUNTER — Ambulatory Visit (HOSPITAL_COMMUNITY): Payer: Medicare Other | Admitting: Physician Assistant

## 2020-09-12 ENCOUNTER — Encounter (HOSPITAL_COMMUNITY): Payer: Self-pay | Admitting: Vascular Surgery

## 2020-09-12 ENCOUNTER — Other Ambulatory Visit: Payer: Self-pay

## 2020-09-12 ENCOUNTER — Ambulatory Visit (HOSPITAL_COMMUNITY)
Admission: RE | Admit: 2020-09-12 | Discharge: 2020-09-12 | Disposition: A | Discharge status: Home / Self Care | Payer: Medicare Other | Attending: Vascular Surgery | Admitting: Vascular Surgery

## 2020-09-12 DIAGNOSIS — Z87891 Personal history of nicotine dependence: Secondary | ICD-10-CM | POA: Insufficient documentation

## 2020-09-12 DIAGNOSIS — K219 Gastro-esophageal reflux disease without esophagitis: Secondary | ICD-10-CM | POA: Insufficient documentation

## 2020-09-12 DIAGNOSIS — Z79899 Other long term (current) drug therapy: Secondary | ICD-10-CM | POA: Insufficient documentation

## 2020-09-12 DIAGNOSIS — N184 Chronic kidney disease, stage 4 (severe): Secondary | ICD-10-CM | POA: Diagnosis not present

## 2020-09-12 DIAGNOSIS — I129 Hypertensive chronic kidney disease with stage 1 through stage 4 chronic kidney disease, or unspecified chronic kidney disease: Secondary | ICD-10-CM | POA: Diagnosis not present

## 2020-09-12 DIAGNOSIS — F329 Major depressive disorder, single episode, unspecified: Secondary | ICD-10-CM | POA: Diagnosis not present

## 2020-09-12 DIAGNOSIS — N186 End stage renal disease: Secondary | ICD-10-CM | POA: Insufficient documentation

## 2020-09-12 DIAGNOSIS — Z7984 Long term (current) use of oral hypoglycemic drugs: Secondary | ICD-10-CM | POA: Diagnosis not present

## 2020-09-12 DIAGNOSIS — K589 Irritable bowel syndrome without diarrhea: Secondary | ICD-10-CM | POA: Insufficient documentation

## 2020-09-12 DIAGNOSIS — E7800 Pure hypercholesterolemia, unspecified: Secondary | ICD-10-CM | POA: Diagnosis not present

## 2020-09-12 DIAGNOSIS — Z992 Dependence on renal dialysis: Secondary | ICD-10-CM | POA: Diagnosis not present

## 2020-09-12 DIAGNOSIS — F419 Anxiety disorder, unspecified: Secondary | ICD-10-CM | POA: Diagnosis not present

## 2020-09-12 DIAGNOSIS — E1122 Type 2 diabetes mellitus with diabetic chronic kidney disease: Secondary | ICD-10-CM | POA: Diagnosis not present

## 2020-09-12 DIAGNOSIS — E1140 Type 2 diabetes mellitus with diabetic neuropathy, unspecified: Secondary | ICD-10-CM | POA: Diagnosis not present

## 2020-09-12 DIAGNOSIS — I120 Hypertensive chronic kidney disease with stage 5 chronic kidney disease or end stage renal disease: Secondary | ICD-10-CM | POA: Insufficient documentation

## 2020-09-12 HISTORY — PX: AV FISTULA PLACEMENT: SHX1204

## 2020-09-12 HISTORY — DX: Unspecified rotator cuff tear or rupture of right shoulder, not specified as traumatic: M75.101

## 2020-09-12 HISTORY — DX: Benign neoplasm of brain, unspecified: D33.2

## 2020-09-12 LAB — GLUCOSE, CAPILLARY
Glucose-Capillary: 194 mg/dL — ABNORMAL HIGH (ref 70–99)
Glucose-Capillary: 144 mg/dL — ABNORMAL HIGH (ref 70–99)
Glucose-Capillary: 126 mg/dL — ABNORMAL HIGH (ref 70–99)

## 2020-09-12 LAB — POCT I-STAT, CHEM 8
BUN: 20 mg/dL (ref 8–23)
Calcium, Ion: 1.14 mmol/L — ABNORMAL LOW (ref 1.15–1.40)
Chloride: 94 mmol/L — ABNORMAL LOW (ref 98–111)
Creatinine, Ser: 2.6 mg/dL — ABNORMAL HIGH (ref 0.44–1.00)
Glucose, Bld: 189 mg/dL — ABNORMAL HIGH (ref 70–99)
HCT: 29 % — ABNORMAL LOW (ref 36.0–46.0)
Hemoglobin: 9.9 g/dL — ABNORMAL LOW (ref 12.0–15.0)
Potassium: 2.7 mmol/L — CL (ref 3.5–5.1)
Sodium: 137 mmol/L (ref 135–145)
TCO2: 26 mmol/L (ref 22–32)

## 2020-09-12 SURGERY — ARTERIOVENOUS (AV) FISTULA CREATION
Anesthesia: Monitor Anesthesia Care | Site: Arm Upper | Laterality: Right

## 2020-09-12 MED ORDER — CHLORHEXIDINE GLUCONATE 4 % EX LIQD: 60.0000 mL | Freq: Once | CUTANEOUS | Status: DC

## 2020-09-12 MED ORDER — SODIUM CHLORIDE 0.9 % IV SOLN: INTRAVENOUS | Status: DC

## 2020-09-12 MED ORDER — CHLORHEXIDINE GLUCONATE 0.12 % MT SOLN
15.0000 mL | OROMUCOSAL | Status: AC
  Administered 2020-09-12: 15 mL via OROMUCOSAL
  Filled 2020-09-12: qty 15

## 2020-09-12 MED ORDER — POTASSIUM CHLORIDE 10 MEQ/100ML IV SOLN
10.0000 meq | INTRAVENOUS | Status: AC
  Administered 2020-09-12: 10 meq via INTRAVENOUS

## 2020-09-12 MED ORDER — LIDOCAINE HCL (CARDIAC) PF 100 MG/5ML IV SOSY
PREFILLED_SYRINGE | INTRAVENOUS | Status: DC | PRN
  Administered 2020-09-12: 40 mg via INTRATRACHEAL

## 2020-09-12 MED ORDER — 0.9 % SODIUM CHLORIDE (POUR BTL) OPTIME
TOPICAL | Status: DC | PRN
  Administered 2020-09-12: 1000 mL

## 2020-09-12 MED ORDER — PROPOFOL 10 MG/ML IV BOLUS
INTRAVENOUS | Status: DC | PRN
  Administered 2020-09-12: 10 mg via INTRAVENOUS

## 2020-09-12 MED ORDER — CEFAZOLIN SODIUM-DEXTROSE 2-4 GM/100ML-% IV SOLN
2.0000 g | INTRAVENOUS | Status: AC
  Administered 2020-09-12: 2 g via INTRAVENOUS
  Filled 2020-09-12: qty 100

## 2020-09-12 MED ORDER — PROTAMINE SULFATE 10 MG/ML IV SOLN
INTRAVENOUS | Status: DC | PRN
  Administered 2020-09-12 (×5): 10 mg via INTRAVENOUS

## 2020-09-12 MED ORDER — FENTANYL CITRATE (PF) 250 MCG/5ML IJ SOLN
INTRAMUSCULAR | Status: AC
  Filled 2020-09-12: qty 5

## 2020-09-12 MED ORDER — LIDOCAINE HCL (PF) 1 % IJ SOLN
INTRAMUSCULAR | Status: DC | PRN
  Administered 2020-09-12: 14 mL

## 2020-09-12 MED ORDER — POTASSIUM CHLORIDE 10 MEQ/100ML IV SOLN
INTRAVENOUS | Status: AC
  Administered 2020-09-12: 10 meq via INTRAVENOUS
  Filled 2020-09-12: qty 100

## 2020-09-12 MED ORDER — SODIUM CHLORIDE 0.9 % IV SOLN
INTRAVENOUS | Status: AC
  Filled 2020-09-12: qty 1.2

## 2020-09-12 MED ORDER — OXYCODONE HCL 15 MG PO TABS
15.0000 mg | ORAL_TABLET | Freq: Four times a day (QID) | ORAL | 0 refills | Status: DC | PRN
Start: 2020-09-12 — End: 2021-01-28

## 2020-09-12 MED ORDER — ALBUMIN HUMAN 5 % IV SOLN: INTRAVENOUS | Status: DC | PRN

## 2020-09-12 MED ORDER — PHENYLEPHRINE HCL-NACL 10-0.9 MG/250ML-% IV SOLN: INTRAVENOUS | Status: DC | PRN

## 2020-09-12 MED ORDER — HEPARIN SODIUM (PORCINE) 1000 UNIT/ML IJ SOLN
INTRAMUSCULAR | Status: DC | PRN
  Administered 2020-09-12: 5000 [IU] via INTRAVENOUS

## 2020-09-12 MED ORDER — SODIUM CHLORIDE 0.9 % IV SOLN
INTRAVENOUS | Status: DC | PRN
  Administered 2020-09-12: 500 mL

## 2020-09-12 MED ORDER — PHENYLEPHRINE HCL-NACL 10-0.9 MG/250ML-% IV SOLN
INTRAVENOUS | Status: DC | PRN
  Administered 2020-09-12: 50 ug/min via INTRAVENOUS

## 2020-09-12 MED ORDER — POTASSIUM CHLORIDE 10 MEQ/100ML IV SOLN
INTRAVENOUS | Status: AC
  Filled 2020-09-12: qty 100

## 2020-09-12 MED ORDER — LIDOCAINE HCL (PF) 1 % IJ SOLN
INTRAMUSCULAR | Status: AC
  Filled 2020-09-12: qty 30

## 2020-09-12 MED ORDER — PROPOFOL 500 MG/50ML IV EMUL
INTRAVENOUS | Status: DC | PRN
  Administered 2020-09-12: 30 ug/kg/min via INTRAVENOUS

## 2020-09-12 MED ORDER — FENTANYL CITRATE (PF) 250 MCG/5ML IJ SOLN
INTRAMUSCULAR | Status: DC | PRN
  Administered 2020-09-12: 50 ug via INTRAVENOUS

## 2020-09-12 SURGICAL SUPPLY — 39 items
ADH SKN CLS APL DERMABOND .7 (GAUZE/BANDAGES/DRESSINGS) ×1
ARMBAND PINK RESTRICT EXTREMIT (MISCELLANEOUS) ×4 IMPLANT
CANISTER SUCT 3000ML PPV (MISCELLANEOUS) ×2 IMPLANT
CANNULA VESSEL 3MM 2 BLNT TIP (CANNULA) ×2 IMPLANT
CLIP VESOCCLUDE MED 6/CT (CLIP) ×2 IMPLANT
CLIP VESOCCLUDE SM WIDE 6/CT (CLIP) ×2 IMPLANT
COVER PROBE W GEL 5X96 (DRAPES) ×1 IMPLANT
COVER WAND RF STERILE (DRAPES) ×2 IMPLANT
DECANTER SPIKE VIAL GLASS SM (MISCELLANEOUS) ×2 IMPLANT
DERMABOND ADVANCED (GAUZE/BANDAGES/DRESSINGS) ×1
DERMABOND ADVANCED .7 DNX12 (GAUZE/BANDAGES/DRESSINGS) ×1 IMPLANT
DRAIN PENROSE 1/4X12 LTX STRL (WOUND CARE) ×2 IMPLANT
DRAPE SURG 17X23 STRL (DRAPES) ×1 IMPLANT
ELECT REM PT RETURN 9FT ADLT (ELECTROSURGICAL) ×2
ELECTRODE REM PT RTRN 9FT ADLT (ELECTROSURGICAL) ×1 IMPLANT
GLOVE BIO SURGEON STRL SZ7.5 (GLOVE) ×2 IMPLANT
GLOVE BIOGEL PI IND STRL 6.5 (GLOVE) IMPLANT
GLOVE BIOGEL PI INDICATOR 6.5 (GLOVE) ×1
GOWN STRL REUS W/ TWL LRG LVL3 (GOWN DISPOSABLE) ×3 IMPLANT
GOWN STRL REUS W/ TWL XL LVL3 (GOWN DISPOSABLE) IMPLANT
GOWN STRL REUS W/TWL 2XL LVL3 (GOWN DISPOSABLE) ×1 IMPLANT
GOWN STRL REUS W/TWL LRG LVL3 (GOWN DISPOSABLE) ×2
GOWN STRL REUS W/TWL XL LVL3 (GOWN DISPOSABLE) ×4
KIT BASIN OR (CUSTOM PROCEDURE TRAY) ×2 IMPLANT
KIT TURNOVER KIT B (KITS) ×2 IMPLANT
LOOP VESSEL MINI RED (MISCELLANEOUS) IMPLANT
NS IRRIG 1000ML POUR BTL (IV SOLUTION) ×2 IMPLANT
PACK CV ACCESS (CUSTOM PROCEDURE TRAY) ×2 IMPLANT
PAD ARMBOARD 7.5X6 YLW CONV (MISCELLANEOUS) ×4 IMPLANT
SPONGE SURGIFOAM ABS GEL 100 (HEMOSTASIS) IMPLANT
SUT PROLENE 6 0 BV (SUTURE) ×1 IMPLANT
SUT PROLENE 6 0 CC (SUTURE) ×1 IMPLANT
SUT PROLENE 7 0 BV 1 (SUTURE) ×1 IMPLANT
SUT VIC AB 3-0 SH 27 (SUTURE) ×2
SUT VIC AB 3-0 SH 27X BRD (SUTURE) ×1 IMPLANT
SUT VICRYL 4-0 PS2 18IN ABS (SUTURE) ×2 IMPLANT
TOWEL GREEN STERILE (TOWEL DISPOSABLE) ×2 IMPLANT
UNDERPAD 30X36 HEAVY ABSORB (UNDERPADS AND DIAPERS) ×2 IMPLANT
WATER STERILE IRR 1000ML POUR (IV SOLUTION) ×2 IMPLANT

## 2020-09-12 NOTE — Interval H&P Note (Signed)
History and Physical Interval Note:  09/12/2020 11:45 AM  [name]  has presented today for surgery, with the diagnosis of END STAGE RENAL DISEASE.  The various methods of treatment have been discussed with the patient and family. After consideration of risks, benefits and other options for treatment, the patient has consented to  Procedure(s): RIGHT ARTERIOVENOUS (AV) FISTULA VERSUS ARTERIOVENOUS GRAFT (Right) as a surgical intervention.  The patient's history has been reviewed, patient examined, no change in status, stable for surgery.  I have reviewed the patient's chart and labs.  Questions were answered to the patient's satisfaction.     [name]

## 2020-09-12 NOTE — Anesthesia Procedure Notes (Signed)
Procedure Name: MAC Date/Time: 09/12/2020 12:05 PM Performed by: Eligha Bridegroom, CRNA Pre-anesthesia Checklist: Patient identified, Emergency Drugs available, Suction available, Patient being monitored and Timeout performed Patient Re-evaluated:Patient Re-evaluated prior to induction Oxygen Delivery Method: Simple face mask

## 2020-09-12 NOTE — Op Note (Signed)
Procedure: First stage right basilic vein fistula  Preoperative diagnosis: CKD4  Postoperative diagnosis: Same  Anesthesia: Local with IV sedation  Assistant: Arlee Muslim, PA-C for expediting procedure.  Also assisting in exposure and creation of anastomosis.  Operative findings: 3-1/2 to 4 mm right basilic vein  Operative details: After obtaining form consent, the patient taken the operating.  The patient was placed in supine position operating table.  After adequate sedation patient's right upper extremities prepped and draped in usual sterile fashion.  Ultrasound was performed to identify the location of the right basilic vein.  Local anesthesia was infiltrated midway between the basilic vein and brachial artery.  A longitudinal incision was made in this location carried down through the subtenons tissues down the level of the right basilic vein.  It was of good quality.  It was dissected free circumferentially and small side branches ligated divided tween silk ties.  Next the brachial artery was dissected free in the medial portion incision.  It was soft on palpation.  Is about 3-1/2 mm in diameter.  It was dissected free circumferentially and Vesseloops placed proximal distal to the planned arteriotomy site.  Patient was then given 5000 intervention venous heparin.  After 2 minutes circulation time, the distal basilic vein was ligated with 3-0 silk ties and the vein transected and swung over the level of the artery.  The vein was gently distended with heparinized saline.  The vein was found up to a 5 mm dilator.  Vesseloops were used to control the artery proximally distally.  Longitudinal opening was made in the brachial artery and the vein was sewn end of vein to side of artery using a running 7-0 Prolene suture.  This prior to completion of anastomosis it was for blood backbled and thoroughly flushed reanastomosed was secured clamps released palpable thrill in the fistula immediately.   Hemostasis was obtained with direct pressure and the assistance of 50 mg of protamine.  Subcutaneous tissues were reapproximated using running 3-0 Vicryl suture.  Skin was closed with a 4-0 Vicryl subcuticular stitch.  Dermabond was applied.  Patient tolerated procedure well and there were no complications.  Instrument sponge and needle count was correct in the case.  Patient was taken the recovery room in stable condition.  [name], MD [facility] Office: [phone]

## 2020-09-12 NOTE — Discharge Instructions (Signed)
° °  [facility] ° °Discharge Instructions ° °AV Fistula or Graft Surgery for Dialysis Access ° °Please refer to the following instructions for your post-procedure care. Your surgeon or physician assistant will discuss any changes with you. ° °Activity ° °You may drive the day following your surgery, if you are comfortable and no longer taking prescription pain medication. Resume full activity as the soreness in your incision resolves. ° °Bathing/Showering ° °You may shower after you go home. Keep your incision dry for 48 hours. Do not soak in a bathtub, hot tub, or swim until the incision heals completely. You may not shower if you have a hemodialysis catheter. ° °Incision Care ° °Clean your incision with mild soap and water after 48 hours. Pat the area dry with a clean towel. You do not need a bandage unless otherwise instructed. Do not apply any ointments or creams to your incision. You may have skin glue on your incision. Do not peel it off. It will come off on its own in about one week. Your arm may swell a bit after surgery. To reduce swelling use pillows to elevate your arm so it is above your heart. Your doctor will tell you if you need to lightly wrap your arm with an ACE bandage. ° °Diet ° °Resume your normal diet. There are not special food restrictions following this procedure. In order to heal from your surgery, it is CRITICAL to get adequate nutrition. Your body requires vitamins, minerals, and protein. Vegetables are the best source of vitamins and minerals. Vegetables also provide the perfect balance of protein. Processed food has little nutritional value, so try to avoid this. ° °Medications ° °Resume taking all of your medications. If your incision is causing pain, you may take over-the counter pain relievers such as acetaminophen (Tylenol). If you were prescribed a stronger pain medication, please be aware these medications can cause nausea and constipation. Prevent  nausea by taking the medication with a snack or meal. Avoid constipation by drinking plenty of fluids and eating foods with high amount of fiber, such as fruits, vegetables, and grains. Do not take Tylenol if you are taking prescription pain medications. ° ° ° ° °Follow up °Your surgeon may want to see you in the office following your access surgery. If so, this will be arranged at the time of your surgery. ° °Please call us immediately for any of the following conditions: ° °Increased pain, redness, drainage (pus) from your incision site °Fever of 101 degrees or higher °Severe or worsening pain at your incision site °Hand pain or numbness. ° °Reduce your risk of vascular disease: ° °Stop smoking. If you would like help, call [facility] at 1-800-QUIT-NOW (1-800-784-8669) or [facility] at 336-586-4000 ° °Manage your cholesterol °Maintain a desired weight °Control your diabetes °Keep your blood pressure down ° °Dialysis ° °It will take several weeks to several months for your new dialysis access to be ready for use. Your surgeon will determine when it is OK to use it. Your nephrologist will continue to direct your dialysis. You can continue to use your Permcath until your new access is ready for use. ° °If you have any questions, please call the office at [phone]. ° °

## 2020-09-12 NOTE — Transfer of Care (Signed)
Immediate Anesthesia Transfer of Care Note  Patient: [name]  Procedure(s) Performed: RIGHT ARTERIOVENOUS (AV) FISTULA CREATION (Right Arm Upper)  Patient Location: PACU  Anesthesia Type:MAC  Level of Consciousness: awake, alert  and oriented  Airway & Oxygen Therapy: Patient Spontanous Breathing  Post-op Assessment: Report given to RN and Post -op Vital signs reviewed and stable  Post vital signs: Reviewed and stable  Last Vitals:  Vitals Value Taken Time  BP 95/51 09/12/20 1334  Temp    Pulse 73 09/12/20 1335  Resp 14 09/12/20 1335  SpO2 100 % 09/12/20 1335  Vitals shown include unvalidated device data.  Last Pain:  Vitals:   09/12/20 0917  TempSrc:   PainSc: 4       Patients Stated Pain Goal: 3 (27/06/23 7628)  Complications: No complications documented.

## 2020-09-12 NOTE — Anesthesia Preprocedure Evaluation (Signed)
Anesthesia Evaluation  Patient identified by MRN, date of birth, ID band Patient awake    Reviewed: Allergy & Precautions, H&P , NPO status , Patient's Chart, lab work & pertinent test results  Airway Mallampati: II   Neck ROM: full    Dental   Pulmonary former smoker,    breath sounds clear to auscultation       Cardiovascular hypertension,  Rhythm:regular Rate:Normal     Neuro/Psych  Headaches, PSYCHIATRIC DISORDERS Anxiety Depression    GI/Hepatic GERD  ,  Endo/Other  diabetes, Type 2Morbid obesity  Renal/GU Renal InsufficiencyRenal disease     Musculoskeletal  (+) Arthritis ,   Abdominal   Peds  Hematology  (+) Blood dyscrasia, anemia ,   Anesthesia Other Findings   Reproductive/Obstetrics                             Anesthesia Physical Anesthesia Plan  ASA: III  Anesthesia Plan: MAC   Post-op Pain Management:    Induction: Intravenous  PONV Risk Score and Plan: 2 and Ondansetron, Propofol infusion and Treatment may vary due to age or medical condition  Airway Management Planned: Simple Face Mask  Additional Equipment:   Intra-op Plan:   Post-operative Plan:   Informed Consent: I have reviewed the patients History and Physical, chart, labs and discussed the procedure including the risks, benefits and alternatives for the proposed anesthesia with the patient or authorized representative who has indicated his/her understanding and acceptance.       Plan Discussed with: CRNA, Anesthesiologist and Surgeon  Anesthesia Plan Comments:         Anesthesia Quick Evaluation

## 2020-09-13 DIAGNOSIS — N2581 Secondary hyperparathyroidism of renal origin: Secondary | ICD-10-CM | POA: Diagnosis not present

## 2020-09-13 DIAGNOSIS — E1129 Type 2 diabetes mellitus with other diabetic kidney complication: Secondary | ICD-10-CM | POA: Diagnosis not present

## 2020-09-13 DIAGNOSIS — Z992 Dependence on renal dialysis: Secondary | ICD-10-CM | POA: Diagnosis not present

## 2020-09-13 DIAGNOSIS — G8929 Other chronic pain: Secondary | ICD-10-CM | POA: Diagnosis not present

## 2020-09-13 DIAGNOSIS — N186 End stage renal disease: Secondary | ICD-10-CM | POA: Diagnosis not present

## 2020-09-13 DIAGNOSIS — T8249XA Other complication of vascular dialysis catheter, initial encounter: Secondary | ICD-10-CM | POA: Diagnosis not present

## 2020-09-13 DIAGNOSIS — D688 Other specified coagulation defects: Secondary | ICD-10-CM | POA: Diagnosis not present

## 2020-09-13 DIAGNOSIS — D509 Iron deficiency anemia, unspecified: Secondary | ICD-10-CM | POA: Diagnosis not present

## 2020-09-13 DIAGNOSIS — D631 Anemia in chronic kidney disease: Secondary | ICD-10-CM | POA: Diagnosis not present

## 2020-09-13 NOTE — Anesthesia Postprocedure Evaluation (Signed)
Anesthesia Post Note  Patient: [name]  Procedure(s) Performed: RIGHT ARTERIOVENOUS (AV) FISTULA CREATION (Right Arm Upper)     Patient location during evaluation: PACU Anesthesia Type: MAC Level of consciousness: awake and alert Pain management: pain level controlled Vital Signs Assessment: post-procedure vital signs reviewed and stable Respiratory status: spontaneous breathing, nonlabored ventilation, respiratory function stable and patient connected to nasal cannula oxygen Cardiovascular status: stable and blood pressure returned to baseline Postop Assessment: no apparent nausea or vomiting Anesthetic complications: no   No complications documented.  Last Vitals:  Vitals:   09/12/20 1411 09/12/20 1419  BP: (!) 98/49 (!) 105/53  Pulse: 75 76  Resp: 14 15  Temp:  36.8 C  SpO2: 95% 93%    Last Pain:  Vitals:   09/12/20 1419  TempSrc:   PainSc: 0-No pain                 Gaelan Glennon S

## 2020-09-14 ENCOUNTER — Encounter (HOSPITAL_COMMUNITY): Payer: Self-pay | Admitting: Vascular Surgery

## 2020-09-16 DIAGNOSIS — D509 Iron deficiency anemia, unspecified: Secondary | ICD-10-CM | POA: Diagnosis not present

## 2020-09-16 DIAGNOSIS — T8249XA Other complication of vascular dialysis catheter, initial encounter: Secondary | ICD-10-CM | POA: Diagnosis not present

## 2020-09-16 DIAGNOSIS — N186 End stage renal disease: Secondary | ICD-10-CM | POA: Diagnosis not present

## 2020-09-16 DIAGNOSIS — D631 Anemia in chronic kidney disease: Secondary | ICD-10-CM | POA: Diagnosis not present

## 2020-09-16 DIAGNOSIS — N2581 Secondary hyperparathyroidism of renal origin: Secondary | ICD-10-CM | POA: Diagnosis not present

## 2020-09-16 DIAGNOSIS — Z992 Dependence on renal dialysis: Secondary | ICD-10-CM | POA: Diagnosis not present

## 2020-09-16 DIAGNOSIS — D688 Other specified coagulation defects: Secondary | ICD-10-CM | POA: Diagnosis not present

## 2020-09-16 DIAGNOSIS — E1129 Type 2 diabetes mellitus with other diabetic kidney complication: Secondary | ICD-10-CM | POA: Diagnosis not present

## 2020-09-16 DIAGNOSIS — G8929 Other chronic pain: Secondary | ICD-10-CM | POA: Diagnosis not present

## 2020-09-18 DIAGNOSIS — D688 Other specified coagulation defects: Secondary | ICD-10-CM | POA: Diagnosis not present

## 2020-09-18 DIAGNOSIS — N2581 Secondary hyperparathyroidism of renal origin: Secondary | ICD-10-CM | POA: Diagnosis not present

## 2020-09-18 DIAGNOSIS — D631 Anemia in chronic kidney disease: Secondary | ICD-10-CM | POA: Diagnosis not present

## 2020-09-18 DIAGNOSIS — E1129 Type 2 diabetes mellitus with other diabetic kidney complication: Secondary | ICD-10-CM | POA: Diagnosis not present

## 2020-09-18 DIAGNOSIS — G8929 Other chronic pain: Secondary | ICD-10-CM | POA: Diagnosis not present

## 2020-09-18 DIAGNOSIS — D509 Iron deficiency anemia, unspecified: Secondary | ICD-10-CM | POA: Diagnosis not present

## 2020-09-18 DIAGNOSIS — Z992 Dependence on renal dialysis: Secondary | ICD-10-CM | POA: Diagnosis not present

## 2020-09-18 DIAGNOSIS — N186 End stage renal disease: Secondary | ICD-10-CM | POA: Diagnosis not present

## 2020-09-18 DIAGNOSIS — T8249XA Other complication of vascular dialysis catheter, initial encounter: Secondary | ICD-10-CM | POA: Diagnosis not present

## 2020-09-18 NOTE — Telephone Encounter (Signed)
Left message that we have been "playing phone tag" and have not been able to reach each other. Let pt know if she is still having issues and needs to talk to return my call at [phone].

## 2020-09-20 DIAGNOSIS — N186 End stage renal disease: Secondary | ICD-10-CM | POA: Diagnosis not present

## 2020-09-20 DIAGNOSIS — N2581 Secondary hyperparathyroidism of renal origin: Secondary | ICD-10-CM | POA: Diagnosis not present

## 2020-09-20 DIAGNOSIS — Z992 Dependence on renal dialysis: Secondary | ICD-10-CM | POA: Diagnosis not present

## 2020-09-20 DIAGNOSIS — D631 Anemia in chronic kidney disease: Secondary | ICD-10-CM | POA: Diagnosis not present

## 2020-09-20 DIAGNOSIS — T8249XA Other complication of vascular dialysis catheter, initial encounter: Secondary | ICD-10-CM | POA: Diagnosis not present

## 2020-09-20 DIAGNOSIS — D688 Other specified coagulation defects: Secondary | ICD-10-CM | POA: Diagnosis not present

## 2020-09-20 DIAGNOSIS — D509 Iron deficiency anemia, unspecified: Secondary | ICD-10-CM | POA: Diagnosis not present

## 2020-09-20 DIAGNOSIS — G8929 Other chronic pain: Secondary | ICD-10-CM | POA: Diagnosis not present

## 2020-09-20 DIAGNOSIS — E1129 Type 2 diabetes mellitus with other diabetic kidney complication: Secondary | ICD-10-CM | POA: Diagnosis not present

## 2020-09-22 ENCOUNTER — Other Ambulatory Visit: Payer: Self-pay | Admitting: Internal Medicine

## 2020-09-23 DIAGNOSIS — E1129 Type 2 diabetes mellitus with other diabetic kidney complication: Secondary | ICD-10-CM | POA: Diagnosis not present

## 2020-09-23 DIAGNOSIS — Z992 Dependence on renal dialysis: Secondary | ICD-10-CM | POA: Diagnosis not present

## 2020-09-23 DIAGNOSIS — D688 Other specified coagulation defects: Secondary | ICD-10-CM | POA: Diagnosis not present

## 2020-09-23 DIAGNOSIS — D509 Iron deficiency anemia, unspecified: Secondary | ICD-10-CM | POA: Diagnosis not present

## 2020-09-23 DIAGNOSIS — N186 End stage renal disease: Secondary | ICD-10-CM | POA: Diagnosis not present

## 2020-09-23 DIAGNOSIS — D631 Anemia in chronic kidney disease: Secondary | ICD-10-CM | POA: Diagnosis not present

## 2020-09-23 DIAGNOSIS — N2581 Secondary hyperparathyroidism of renal origin: Secondary | ICD-10-CM | POA: Diagnosis not present

## 2020-09-23 DIAGNOSIS — T8249XA Other complication of vascular dialysis catheter, initial encounter: Secondary | ICD-10-CM | POA: Diagnosis not present

## 2020-09-23 DIAGNOSIS — G8929 Other chronic pain: Secondary | ICD-10-CM | POA: Diagnosis not present

## 2020-09-25 DIAGNOSIS — D631 Anemia in chronic kidney disease: Secondary | ICD-10-CM | POA: Diagnosis not present

## 2020-09-25 DIAGNOSIS — N2581 Secondary hyperparathyroidism of renal origin: Secondary | ICD-10-CM | POA: Diagnosis not present

## 2020-09-25 DIAGNOSIS — N186 End stage renal disease: Secondary | ICD-10-CM | POA: Diagnosis not present

## 2020-09-25 DIAGNOSIS — D688 Other specified coagulation defects: Secondary | ICD-10-CM | POA: Diagnosis not present

## 2020-09-25 DIAGNOSIS — E1129 Type 2 diabetes mellitus with other diabetic kidney complication: Secondary | ICD-10-CM | POA: Diagnosis not present

## 2020-09-25 DIAGNOSIS — D509 Iron deficiency anemia, unspecified: Secondary | ICD-10-CM | POA: Diagnosis not present

## 2020-09-25 DIAGNOSIS — Z992 Dependence on renal dialysis: Secondary | ICD-10-CM | POA: Diagnosis not present

## 2020-09-25 DIAGNOSIS — G8929 Other chronic pain: Secondary | ICD-10-CM | POA: Diagnosis not present

## 2020-09-25 DIAGNOSIS — T8249XA Other complication of vascular dialysis catheter, initial encounter: Secondary | ICD-10-CM | POA: Diagnosis not present

## 2020-09-26 ENCOUNTER — Ambulatory Visit: Payer: Medicare Other | Admitting: Neurology

## 2020-09-27 DIAGNOSIS — N2581 Secondary hyperparathyroidism of renal origin: Secondary | ICD-10-CM | POA: Diagnosis not present

## 2020-09-27 DIAGNOSIS — Z992 Dependence on renal dialysis: Secondary | ICD-10-CM | POA: Diagnosis not present

## 2020-09-27 DIAGNOSIS — N186 End stage renal disease: Secondary | ICD-10-CM | POA: Diagnosis not present

## 2020-09-27 DIAGNOSIS — T8249XA Other complication of vascular dialysis catheter, initial encounter: Secondary | ICD-10-CM | POA: Diagnosis not present

## 2020-09-27 DIAGNOSIS — G8929 Other chronic pain: Secondary | ICD-10-CM | POA: Diagnosis not present

## 2020-09-27 DIAGNOSIS — D631 Anemia in chronic kidney disease: Secondary | ICD-10-CM | POA: Diagnosis not present

## 2020-09-27 DIAGNOSIS — D688 Other specified coagulation defects: Secondary | ICD-10-CM | POA: Diagnosis not present

## 2020-09-27 DIAGNOSIS — D509 Iron deficiency anemia, unspecified: Secondary | ICD-10-CM | POA: Diagnosis not present

## 2020-09-27 DIAGNOSIS — E1129 Type 2 diabetes mellitus with other diabetic kidney complication: Secondary | ICD-10-CM | POA: Diagnosis not present

## 2020-09-30 ENCOUNTER — Other Ambulatory Visit: Payer: Self-pay | Admitting: *Deleted

## 2020-09-30 ENCOUNTER — Other Ambulatory Visit: Payer: Self-pay

## 2020-09-30 DIAGNOSIS — N2581 Secondary hyperparathyroidism of renal origin: Secondary | ICD-10-CM | POA: Diagnosis not present

## 2020-09-30 DIAGNOSIS — D688 Other specified coagulation defects: Secondary | ICD-10-CM | POA: Diagnosis not present

## 2020-09-30 DIAGNOSIS — N186 End stage renal disease: Secondary | ICD-10-CM | POA: Diagnosis not present

## 2020-09-30 DIAGNOSIS — Z992 Dependence on renal dialysis: Secondary | ICD-10-CM | POA: Diagnosis not present

## 2020-09-30 DIAGNOSIS — D509 Iron deficiency anemia, unspecified: Secondary | ICD-10-CM | POA: Diagnosis not present

## 2020-09-30 DIAGNOSIS — T8249XA Other complication of vascular dialysis catheter, initial encounter: Secondary | ICD-10-CM | POA: Diagnosis not present

## 2020-09-30 DIAGNOSIS — G8929 Other chronic pain: Secondary | ICD-10-CM | POA: Diagnosis not present

## 2020-09-30 DIAGNOSIS — E1129 Type 2 diabetes mellitus with other diabetic kidney complication: Secondary | ICD-10-CM | POA: Diagnosis not present

## 2020-09-30 DIAGNOSIS — D631 Anemia in chronic kidney disease: Secondary | ICD-10-CM | POA: Diagnosis not present

## 2020-09-30 DIAGNOSIS — N185 Chronic kidney disease, stage 5: Secondary | ICD-10-CM

## 2020-10-01 ENCOUNTER — Other Ambulatory Visit: Payer: Self-pay | Admitting: Internal Medicine

## 2020-10-02 DIAGNOSIS — N186 End stage renal disease: Secondary | ICD-10-CM | POA: Diagnosis not present

## 2020-10-02 DIAGNOSIS — G8929 Other chronic pain: Secondary | ICD-10-CM | POA: Diagnosis not present

## 2020-10-02 DIAGNOSIS — E1129 Type 2 diabetes mellitus with other diabetic kidney complication: Secondary | ICD-10-CM | POA: Diagnosis not present

## 2020-10-02 DIAGNOSIS — Z992 Dependence on renal dialysis: Secondary | ICD-10-CM | POA: Diagnosis not present

## 2020-10-02 DIAGNOSIS — N2581 Secondary hyperparathyroidism of renal origin: Secondary | ICD-10-CM | POA: Diagnosis not present

## 2020-10-02 DIAGNOSIS — D688 Other specified coagulation defects: Secondary | ICD-10-CM | POA: Diagnosis not present

## 2020-10-02 DIAGNOSIS — T8249XA Other complication of vascular dialysis catheter, initial encounter: Secondary | ICD-10-CM | POA: Diagnosis not present

## 2020-10-02 DIAGNOSIS — D509 Iron deficiency anemia, unspecified: Secondary | ICD-10-CM | POA: Diagnosis not present

## 2020-10-02 DIAGNOSIS — D631 Anemia in chronic kidney disease: Secondary | ICD-10-CM | POA: Diagnosis not present

## 2020-10-04 DIAGNOSIS — N186 End stage renal disease: Secondary | ICD-10-CM | POA: Diagnosis not present

## 2020-10-04 DIAGNOSIS — D688 Other specified coagulation defects: Secondary | ICD-10-CM | POA: Diagnosis not present

## 2020-10-04 DIAGNOSIS — E1129 Type 2 diabetes mellitus with other diabetic kidney complication: Secondary | ICD-10-CM | POA: Diagnosis not present

## 2020-10-04 DIAGNOSIS — G8929 Other chronic pain: Secondary | ICD-10-CM | POA: Diagnosis not present

## 2020-10-04 DIAGNOSIS — T8249XA Other complication of vascular dialysis catheter, initial encounter: Secondary | ICD-10-CM | POA: Diagnosis not present

## 2020-10-04 DIAGNOSIS — Z992 Dependence on renal dialysis: Secondary | ICD-10-CM | POA: Diagnosis not present

## 2020-10-04 DIAGNOSIS — N2581 Secondary hyperparathyroidism of renal origin: Secondary | ICD-10-CM | POA: Diagnosis not present

## 2020-10-04 DIAGNOSIS — D509 Iron deficiency anemia, unspecified: Secondary | ICD-10-CM | POA: Diagnosis not present

## 2020-10-04 DIAGNOSIS — D631 Anemia in chronic kidney disease: Secondary | ICD-10-CM | POA: Diagnosis not present

## 2020-10-05 ENCOUNTER — Other Ambulatory Visit: Payer: Self-pay | Admitting: Internal Medicine

## 2020-10-07 DIAGNOSIS — N2581 Secondary hyperparathyroidism of renal origin: Secondary | ICD-10-CM | POA: Diagnosis not present

## 2020-10-07 DIAGNOSIS — G8929 Other chronic pain: Secondary | ICD-10-CM | POA: Diagnosis not present

## 2020-10-07 DIAGNOSIS — D631 Anemia in chronic kidney disease: Secondary | ICD-10-CM | POA: Diagnosis not present

## 2020-10-07 DIAGNOSIS — N186 End stage renal disease: Secondary | ICD-10-CM | POA: Diagnosis not present

## 2020-10-07 DIAGNOSIS — D688 Other specified coagulation defects: Secondary | ICD-10-CM | POA: Diagnosis not present

## 2020-10-07 DIAGNOSIS — D509 Iron deficiency anemia, unspecified: Secondary | ICD-10-CM | POA: Diagnosis not present

## 2020-10-07 DIAGNOSIS — Z992 Dependence on renal dialysis: Secondary | ICD-10-CM | POA: Diagnosis not present

## 2020-10-07 DIAGNOSIS — E1129 Type 2 diabetes mellitus with other diabetic kidney complication: Secondary | ICD-10-CM | POA: Diagnosis not present

## 2020-10-07 DIAGNOSIS — T8249XA Other complication of vascular dialysis catheter, initial encounter: Secondary | ICD-10-CM | POA: Diagnosis not present

## 2020-10-08 MED FILL — oxyCODONE HCL 15 MG TABS: 15 | 30 days supply | Qty: 120 | Fill #0

## 2020-10-09 DIAGNOSIS — T8249XA Other complication of vascular dialysis catheter, initial encounter: Secondary | ICD-10-CM | POA: Diagnosis not present

## 2020-10-09 DIAGNOSIS — N2581 Secondary hyperparathyroidism of renal origin: Secondary | ICD-10-CM | POA: Diagnosis not present

## 2020-10-09 DIAGNOSIS — E1129 Type 2 diabetes mellitus with other diabetic kidney complication: Secondary | ICD-10-CM | POA: Diagnosis not present

## 2020-10-09 DIAGNOSIS — Z992 Dependence on renal dialysis: Secondary | ICD-10-CM | POA: Diagnosis not present

## 2020-10-09 DIAGNOSIS — D631 Anemia in chronic kidney disease: Secondary | ICD-10-CM | POA: Diagnosis not present

## 2020-10-09 DIAGNOSIS — G8929 Other chronic pain: Secondary | ICD-10-CM | POA: Diagnosis not present

## 2020-10-09 DIAGNOSIS — D509 Iron deficiency anemia, unspecified: Secondary | ICD-10-CM | POA: Diagnosis not present

## 2020-10-09 DIAGNOSIS — D688 Other specified coagulation defects: Secondary | ICD-10-CM | POA: Diagnosis not present

## 2020-10-09 DIAGNOSIS — N186 End stage renal disease: Secondary | ICD-10-CM | POA: Diagnosis not present

## 2020-10-10 ENCOUNTER — Other Ambulatory Visit: Payer: Self-pay | Admitting: Internal Medicine

## 2020-10-11 DIAGNOSIS — T8249XA Other complication of vascular dialysis catheter, initial encounter: Secondary | ICD-10-CM | POA: Diagnosis not present

## 2020-10-11 DIAGNOSIS — E1129 Type 2 diabetes mellitus with other diabetic kidney complication: Secondary | ICD-10-CM | POA: Diagnosis not present

## 2020-10-11 DIAGNOSIS — D688 Other specified coagulation defects: Secondary | ICD-10-CM | POA: Diagnosis not present

## 2020-10-11 DIAGNOSIS — Z992 Dependence on renal dialysis: Secondary | ICD-10-CM | POA: Diagnosis not present

## 2020-10-11 DIAGNOSIS — N2581 Secondary hyperparathyroidism of renal origin: Secondary | ICD-10-CM | POA: Diagnosis not present

## 2020-10-11 DIAGNOSIS — D631 Anemia in chronic kidney disease: Secondary | ICD-10-CM | POA: Diagnosis not present

## 2020-10-11 DIAGNOSIS — N186 End stage renal disease: Secondary | ICD-10-CM | POA: Diagnosis not present

## 2020-10-11 DIAGNOSIS — G8929 Other chronic pain: Secondary | ICD-10-CM | POA: Diagnosis not present

## 2020-10-11 DIAGNOSIS — D509 Iron deficiency anemia, unspecified: Secondary | ICD-10-CM | POA: Diagnosis not present

## 2020-10-12 DIAGNOSIS — Z992 Dependence on renal dialysis: Secondary | ICD-10-CM | POA: Diagnosis not present

## 2020-10-12 DIAGNOSIS — E1022 Type 1 diabetes mellitus with diabetic chronic kidney disease: Secondary | ICD-10-CM | POA: Diagnosis not present

## 2020-10-12 DIAGNOSIS — N186 End stage renal disease: Secondary | ICD-10-CM | POA: Diagnosis not present

## 2020-10-14 DIAGNOSIS — D631 Anemia in chronic kidney disease: Secondary | ICD-10-CM | POA: Diagnosis not present

## 2020-10-14 DIAGNOSIS — R519 Headache, unspecified: Secondary | ICD-10-CM | POA: Diagnosis not present

## 2020-10-14 DIAGNOSIS — Z992 Dependence on renal dialysis: Secondary | ICD-10-CM | POA: Diagnosis not present

## 2020-10-14 DIAGNOSIS — Z23 Encounter for immunization: Secondary | ICD-10-CM | POA: Diagnosis not present

## 2020-10-14 DIAGNOSIS — E1129 Type 2 diabetes mellitus with other diabetic kidney complication: Secondary | ICD-10-CM | POA: Diagnosis not present

## 2020-10-14 DIAGNOSIS — G8929 Other chronic pain: Secondary | ICD-10-CM | POA: Diagnosis not present

## 2020-10-14 DIAGNOSIS — D509 Iron deficiency anemia, unspecified: Secondary | ICD-10-CM | POA: Diagnosis not present

## 2020-10-14 DIAGNOSIS — D688 Other specified coagulation defects: Secondary | ICD-10-CM | POA: Diagnosis not present

## 2020-10-14 DIAGNOSIS — N186 End stage renal disease: Secondary | ICD-10-CM | POA: Diagnosis not present

## 2020-10-14 DIAGNOSIS — N2581 Secondary hyperparathyroidism of renal origin: Secondary | ICD-10-CM | POA: Diagnosis not present

## 2020-10-15 ENCOUNTER — Other Ambulatory Visit: Payer: Self-pay

## 2020-10-15 ENCOUNTER — Ambulatory Visit (HOSPITAL_COMMUNITY)
Admission: RE | Admit: 2020-10-15 | Discharge: 2020-10-15 | Disposition: A | Discharge status: Home / Self Care | Payer: Medicare Other | Source: Ambulatory Visit | Attending: Physician Assistant | Admitting: Physician Assistant

## 2020-10-15 ENCOUNTER — Ambulatory Visit (INDEPENDENT_AMBULATORY_CARE_PROVIDER_SITE_OTHER): Payer: Self-pay | Admitting: Physician Assistant

## 2020-10-15 VITALS — BP 140/71 | HR 84 | Temp 98.5°F | Resp 20 | Ht 62.0 in | Wt 233.6 lb

## 2020-10-15 DIAGNOSIS — N185 Chronic kidney disease, stage 5: Secondary | ICD-10-CM | POA: Insufficient documentation

## 2020-10-15 DIAGNOSIS — N186 End stage renal disease: Secondary | ICD-10-CM | POA: Diagnosis not present

## 2020-10-15 DIAGNOSIS — G894 Chronic pain syndrome: Secondary | ICD-10-CM | POA: Diagnosis not present

## 2020-10-15 DIAGNOSIS — Z992 Dependence on renal dialysis: Secondary | ICD-10-CM

## 2020-10-15 DIAGNOSIS — E1140 Type 2 diabetes mellitus with diabetic neuropathy, unspecified: Secondary | ICD-10-CM | POA: Diagnosis not present

## 2020-10-15 NOTE — Progress Notes (Signed)
POST OPERATIVE OFFICE NOTE    CC:  F/u for surgery  HPI:  This is a 69 y.o. female who is s/p First stage right basilic vein fistula  on 09/12/2020 by Dr. Oneida Alar.    Pt returns today for follow up and fistula duplex exam.  She denise pain, loss of motor and loss of sensation in the right UE.  She has a working right [facility] and is on HD TTS.      Allergies  Allergen Reactions  . Erythromycin Diarrhea and Nausea And Vomiting  . Gabapentin     Confusion and falling     Current Outpatient Medications  Medication Sig Dispense Refill  . acetaminophen (TYLENOL) 500 MG tablet Take 1,000 mg by mouth every 6 (six) hours as needed for moderate pain or headache.    Marland Kitchen amoxicillin (AMOXIL) 500 MG capsule TAKE 4 CAPSULES BY MOUTH 1  HOUR PRIOR TO DENTAL  PROCEDURE/CLEANING (Patient taking differently: Take 2,000 mg by mouth See admin instructions. Take 2000 mg 1 hour prior to dental work) 16 capsule 1  . blood glucose meter kit and supplies Dispense based on patient and insurance preference. Used to check blood sugar daily, DX: E11.9 OneTouch 1 each 0  . buPROPion (WELLBUTRIN XL) 300 MG 24 hr tablet Take 1 tablet (300 mg total) by mouth at bedtime. 90 tablet 1  . cyclobenzaprine (FLEXERIL) 10 MG tablet TAKE 3 TABLETS BY MOUTH AT  BEDTIME 270 tablet 1  . dicyclomine (BENTYL) 20 MG tablet Take one tablet every 4-6 hours as needed for abdominal pain (Patient taking differently: Take 20 mg by mouth every 4 (four) hours as needed (abdominal pain). ) 60 tablet 2  . DULoxetine (CYMBALTA) 60 MG capsule TAKE 1 CAPSULE BY MOUTH AT  BEDTIME 90 capsule 3  . fexofenadine (ALLEGRA) 180 MG tablet Take 1 tablet (180 mg total) by mouth daily. (Patient taking differently: Take 180 mg by mouth daily as needed for allergies. ) 90 tablet 3  . glimepiride (AMARYL) 1 MG tablet TAKE 1 TABLET BY MOUTH  DAILY BEFORE BREAKFAST (Patient taking differently: Take 1 mg by mouth daily before breakfast. ) 90 tablet 3  . glucose blood  (TRUETEST TEST) test strip Used to check blood sugar daily, DX: E11.9 100 each 3  . ketoconazole (NIZORAL) 200 MG tablet Take 0.5 tablets (100 mg total) by mouth daily. 20 tablet 2  . Lancets (ONETOUCH ULTRASOFT) lancets Used to check blood sugar daily, DX: E11.9 100 each 3  . lidocaine (XYLOCAINE) 5 % ointment Apply topically 3 (three) times daily as needed. (Patient taking differently: Apply 1 application topically 3 (three) times daily as needed for moderate pain. ) 100 g 3  . LORazepam (ATIVAN) 2 MG tablet 1 po qhs prn insomnia (Patient taking differently: Take 1-2 mg by mouth every 6 (six) hours as needed for anxiety or sleep. ) 90 tablet 2  . Menthol-Methyl Salicylate (SALONPAS PAIN RELIEF PATCH EX) Apply 2 patches topically daily as needed (shoulder pain).    . multivitamin (RENA-VIT) TABS tablet Take 1 tablet by mouth daily.    Marland Kitchen oxyCODONE (ROXICODONE) 15 MG immediate release tablet Take 1 tablet (15 mg total) by mouth every 6 (six) hours as needed for pain. 8 tablet 0  . pantoprazole (PROTONIX) 40 MG tablet TAKE 1 TABLET BY MOUTH  DAILY 90 tablet 3  . pilocarpine (SALAGEN) 5 MG tablet TAKE 1 TABLET BY MOUTH  TWICE DAILY 180 tablet 1  . pioglitazone (ACTOS) 15 MG tablet  TAKE 1 TABLET BY MOUTH  DAILY (Patient taking differently: Take 15 mg by mouth daily. ) 90 tablet 3  . polyvinyl alcohol (LIQUIFILM TEARS) 1.4 % ophthalmic solution Place 1 drop into both eyes daily as needed for dry eyes.    . PSYLLIUM PO Take 1 Dose by mouth daily.     . repaglinide (PRANDIN) 1 MG tablet Take 1 mg by mouth 3 (three) times daily.    . solifenacin (VESICARE) 5 MG tablet TAKE 1 TABLET BY MOUTH  DAILY (Patient taking differently: Take 5 mg by mouth daily. ) 90 tablet 3  . STOOL SOFTENER 100 MG capsule Take 100 mg by mouth daily.    Marland Kitchen triamcinolone (NASACORT) 55 MCG/ACT AERO nasal inhaler Place 2 sprays into the nose daily. (Patient taking differently: Place 2 sprays into the nose daily as needed (allergies). ) 3  each 3  . carvedilol (COREG) 25 MG tablet TAKE ONE-HALF TABLET BY  MOUTH TWICE A DAY WITH A  MEAL (Patient not taking: Reported on 09/05/2020) 90 tablet 3  . torsemide (DEMADEX) 100 MG tablet Take 1 tablet (100 mg total) by mouth daily. (Patient not taking: Reported on 09/03/2020) 90 tablet 3   No current facility-administered medications for this visit.     ROS:  See HPI  Physical Exam:    Findings:  +--------------------+----------+-----------------+--------+  AVF         PSV (cm/s)Flow Vol (mL/min)Comments  +--------------------+----------+-----------------+--------+  Native artery inflow  340     1798          +--------------------+----------+-----------------+--------+  AVF Anastomosis     403                 +--------------------+----------+-----------------+--------+     +---------------------+----------+------------+----------+-----------------  ----+  OUTFLOW VEIN     PSV (cm/s) Diameter Depth (cm)   Describe                        (cm)                    +---------------------+----------+------------+----------+-----------------  ----+  Confluence into Brach  150               confluence  into    V                             brachial      +---------------------+----------+------------+----------+-----------------  ----+  Prox UA         187    0.68    1.86   joins into  brachial                                  vein       +---------------------+----------+------------+----------+-----------------  ----+  Mid UA          143    0.68    1.77   competing  branch    +---------------------+----------+------------+----------+-----------------  ----+  Dist UA         541    0.54    0.44                +---------------------+----------+------------+----------+-----------------  ----+        Summary:  Patent arteriovenous fistula  Incision:  Well healed Extremities:  Palpable radial pulse, sensation intact, grip 5/5.  Palpable thrill in the fistula at the anastomosis right  UE. Lungs : non labored breathing    Assessment/Plan:  This is a 69 y.o. female who is s/p:Right first stage basilic AV fistula creation.  The fistula is maturing well and we will schedule her for second stage AV fistula transposition with DR. Fields in the next week or two.       Roxy Horseman PA-C [facility] [phone]  Clinic MD:  Scot Dock

## 2020-10-15 NOTE — H&P (View-Only) (Signed)
POST OPERATIVE OFFICE NOTE    CC:  F/u for surgery  HPI:  This is a 69 y.o. female who is s/p First stage right basilic vein fistula  on 09/12/2020 by Dr. Oneida Alar.    Pt returns today for follow up and fistula duplex exam.  She denise pain, loss of motor and loss of sensation in the right UE.  She has a working right [facility] and is on HD TTS.      Allergies  Allergen Reactions  . Erythromycin Diarrhea and Nausea And Vomiting  . Gabapentin     Confusion and falling     Current Outpatient Medications  Medication Sig Dispense Refill  . acetaminophen (TYLENOL) 500 MG tablet Take 1,000 mg by mouth every 6 (six) hours as needed for moderate pain or headache.    Marland Kitchen amoxicillin (AMOXIL) 500 MG capsule TAKE 4 CAPSULES BY MOUTH 1  HOUR PRIOR TO DENTAL  PROCEDURE/CLEANING (Patient taking differently: Take 2,000 mg by mouth See admin instructions. Take 2000 mg 1 hour prior to dental work) 16 capsule 1  . blood glucose meter kit and supplies Dispense based on patient and insurance preference. Used to check blood sugar daily, DX: E11.9 OneTouch 1 each 0  . buPROPion (WELLBUTRIN XL) 300 MG 24 hr tablet Take 1 tablet (300 mg total) by mouth at bedtime. 90 tablet 1  . cyclobenzaprine (FLEXERIL) 10 MG tablet TAKE 3 TABLETS BY MOUTH AT  BEDTIME 270 tablet 1  . dicyclomine (BENTYL) 20 MG tablet Take one tablet every 4-6 hours as needed for abdominal pain (Patient taking differently: Take 20 mg by mouth every 4 (four) hours as needed (abdominal pain). ) 60 tablet 2  . DULoxetine (CYMBALTA) 60 MG capsule TAKE 1 CAPSULE BY MOUTH AT  BEDTIME 90 capsule 3  . fexofenadine (ALLEGRA) 180 MG tablet Take 1 tablet (180 mg total) by mouth daily. (Patient taking differently: Take 180 mg by mouth daily as needed for allergies. ) 90 tablet 3  . glimepiride (AMARYL) 1 MG tablet TAKE 1 TABLET BY MOUTH  DAILY BEFORE BREAKFAST (Patient taking differently: Take 1 mg by mouth daily before breakfast. ) 90 tablet 3  . glucose blood  (TRUETEST TEST) test strip Used to check blood sugar daily, DX: E11.9 100 each 3  . ketoconazole (NIZORAL) 200 MG tablet Take 0.5 tablets (100 mg total) by mouth daily. 20 tablet 2  . Lancets (ONETOUCH ULTRASOFT) lancets Used to check blood sugar daily, DX: E11.9 100 each 3  . lidocaine (XYLOCAINE) 5 % ointment Apply topically 3 (three) times daily as needed. (Patient taking differently: Apply 1 application topically 3 (three) times daily as needed for moderate pain. ) 100 g 3  . LORazepam (ATIVAN) 2 MG tablet 1 po qhs prn insomnia (Patient taking differently: Take 1-2 mg by mouth every 6 (six) hours as needed for anxiety or sleep. ) 90 tablet 2  . Menthol-Methyl Salicylate (SALONPAS PAIN RELIEF PATCH EX) Apply 2 patches topically daily as needed (shoulder pain).    . multivitamin (RENA-VIT) TABS tablet Take 1 tablet by mouth daily.    Marland Kitchen oxyCODONE (ROXICODONE) 15 MG immediate release tablet Take 1 tablet (15 mg total) by mouth every 6 (six) hours as needed for pain. 8 tablet 0  . pantoprazole (PROTONIX) 40 MG tablet TAKE 1 TABLET BY MOUTH  DAILY 90 tablet 3  . pilocarpine (SALAGEN) 5 MG tablet TAKE 1 TABLET BY MOUTH  TWICE DAILY 180 tablet 1  . pioglitazone (ACTOS) 15 MG tablet  TAKE 1 TABLET BY MOUTH  DAILY (Patient taking differently: Take 15 mg by mouth daily. ) 90 tablet 3  . polyvinyl alcohol (LIQUIFILM TEARS) 1.4 % ophthalmic solution Place 1 drop into both eyes daily as needed for dry eyes.    . PSYLLIUM PO Take 1 Dose by mouth daily.     . repaglinide (PRANDIN) 1 MG tablet Take 1 mg by mouth 3 (three) times daily.    . solifenacin (VESICARE) 5 MG tablet TAKE 1 TABLET BY MOUTH  DAILY (Patient taking differently: Take 5 mg by mouth daily. ) 90 tablet 3  . STOOL SOFTENER 100 MG capsule Take 100 mg by mouth daily.    Marland Kitchen triamcinolone (NASACORT) 55 MCG/ACT AERO nasal inhaler Place 2 sprays into the nose daily. (Patient taking differently: Place 2 sprays into the nose daily as needed (allergies). ) 3  each 3  . carvedilol (COREG) 25 MG tablet TAKE ONE-HALF TABLET BY  MOUTH TWICE A DAY WITH A  MEAL (Patient not taking: Reported on 09/05/2020) 90 tablet 3  . torsemide (DEMADEX) 100 MG tablet Take 1 tablet (100 mg total) by mouth daily. (Patient not taking: Reported on 09/03/2020) 90 tablet 3   No current facility-administered medications for this visit.     ROS:  See HPI  Physical Exam:    Findings:  +--------------------+----------+-----------------+--------+  AVF         PSV (cm/s)Flow Vol (mL/min)Comments  +--------------------+----------+-----------------+--------+  Native artery inflow  340     1798          +--------------------+----------+-----------------+--------+  AVF Anastomosis     403                 +--------------------+----------+-----------------+--------+     +---------------------+----------+------------+----------+-----------------  ----+  OUTFLOW VEIN     PSV (cm/s) Diameter Depth (cm)   Describe                        (cm)                    +---------------------+----------+------------+----------+-----------------  ----+  Confluence into Brach  150               confluence  into    V                             brachial      +---------------------+----------+------------+----------+-----------------  ----+  Prox UA         187    0.68    1.86   joins into  brachial                                  vein       +---------------------+----------+------------+----------+-----------------  ----+  Mid UA          143    0.68    1.77   competing  branch    +---------------------+----------+------------+----------+-----------------  ----+  Dist UA         541    0.54    0.44                +---------------------+----------+------------+----------+-----------------  ----+        Summary:  Patent arteriovenous fistula  Incision:  Well healed Extremities:  Palpable radial pulse, sensation intact, grip 5/5.  Palpable thrill in the fistula at the anastomosis right  UE. Lungs : non labored breathing    Assessment/Plan:  This is a 69 y.o. female who is s/p:Right first stage basilic AV fistula creation.  The fistula is maturing well and we will schedule her for second stage AV fistula transposition with DR. Fields in the next week or two.       Roxy Horseman PA-C [facility] [phone]  Clinic MD:  Scot Dock

## 2020-10-16 DIAGNOSIS — N186 End stage renal disease: Secondary | ICD-10-CM | POA: Diagnosis not present

## 2020-10-16 DIAGNOSIS — D509 Iron deficiency anemia, unspecified: Secondary | ICD-10-CM | POA: Diagnosis not present

## 2020-10-16 DIAGNOSIS — G8929 Other chronic pain: Secondary | ICD-10-CM | POA: Diagnosis not present

## 2020-10-16 DIAGNOSIS — Z992 Dependence on renal dialysis: Secondary | ICD-10-CM | POA: Diagnosis not present

## 2020-10-16 DIAGNOSIS — D688 Other specified coagulation defects: Secondary | ICD-10-CM | POA: Diagnosis not present

## 2020-10-16 DIAGNOSIS — N2581 Secondary hyperparathyroidism of renal origin: Secondary | ICD-10-CM | POA: Diagnosis not present

## 2020-10-16 DIAGNOSIS — R519 Headache, unspecified: Secondary | ICD-10-CM | POA: Diagnosis not present

## 2020-10-16 DIAGNOSIS — E1129 Type 2 diabetes mellitus with other diabetic kidney complication: Secondary | ICD-10-CM | POA: Diagnosis not present

## 2020-10-16 DIAGNOSIS — D631 Anemia in chronic kidney disease: Secondary | ICD-10-CM | POA: Diagnosis not present

## 2020-10-16 DIAGNOSIS — Z23 Encounter for immunization: Secondary | ICD-10-CM | POA: Diagnosis not present

## 2020-10-18 DIAGNOSIS — R519 Headache, unspecified: Secondary | ICD-10-CM | POA: Diagnosis not present

## 2020-10-18 DIAGNOSIS — Z23 Encounter for immunization: Secondary | ICD-10-CM | POA: Diagnosis not present

## 2020-10-18 DIAGNOSIS — E1129 Type 2 diabetes mellitus with other diabetic kidney complication: Secondary | ICD-10-CM | POA: Diagnosis not present

## 2020-10-18 DIAGNOSIS — N2581 Secondary hyperparathyroidism of renal origin: Secondary | ICD-10-CM | POA: Diagnosis not present

## 2020-10-18 DIAGNOSIS — D688 Other specified coagulation defects: Secondary | ICD-10-CM | POA: Diagnosis not present

## 2020-10-18 DIAGNOSIS — Z992 Dependence on renal dialysis: Secondary | ICD-10-CM | POA: Diagnosis not present

## 2020-10-18 DIAGNOSIS — G8929 Other chronic pain: Secondary | ICD-10-CM | POA: Diagnosis not present

## 2020-10-18 DIAGNOSIS — N186 End stage renal disease: Secondary | ICD-10-CM | POA: Diagnosis not present

## 2020-10-18 DIAGNOSIS — D631 Anemia in chronic kidney disease: Secondary | ICD-10-CM | POA: Diagnosis not present

## 2020-10-18 DIAGNOSIS — D509 Iron deficiency anemia, unspecified: Secondary | ICD-10-CM | POA: Diagnosis not present

## 2020-10-21 DIAGNOSIS — E1129 Type 2 diabetes mellitus with other diabetic kidney complication: Secondary | ICD-10-CM | POA: Diagnosis not present

## 2020-10-21 DIAGNOSIS — R519 Headache, unspecified: Secondary | ICD-10-CM | POA: Diagnosis not present

## 2020-10-21 DIAGNOSIS — D509 Iron deficiency anemia, unspecified: Secondary | ICD-10-CM | POA: Diagnosis not present

## 2020-10-21 DIAGNOSIS — N186 End stage renal disease: Secondary | ICD-10-CM | POA: Diagnosis not present

## 2020-10-21 DIAGNOSIS — Z23 Encounter for immunization: Secondary | ICD-10-CM | POA: Diagnosis not present

## 2020-10-21 DIAGNOSIS — Z992 Dependence on renal dialysis: Secondary | ICD-10-CM | POA: Diagnosis not present

## 2020-10-21 DIAGNOSIS — D688 Other specified coagulation defects: Secondary | ICD-10-CM | POA: Diagnosis not present

## 2020-10-21 DIAGNOSIS — D631 Anemia in chronic kidney disease: Secondary | ICD-10-CM | POA: Diagnosis not present

## 2020-10-21 DIAGNOSIS — N2581 Secondary hyperparathyroidism of renal origin: Secondary | ICD-10-CM | POA: Diagnosis not present

## 2020-10-21 DIAGNOSIS — G8929 Other chronic pain: Secondary | ICD-10-CM | POA: Diagnosis not present

## 2020-10-23 DIAGNOSIS — D688 Other specified coagulation defects: Secondary | ICD-10-CM | POA: Diagnosis not present

## 2020-10-23 DIAGNOSIS — D509 Iron deficiency anemia, unspecified: Secondary | ICD-10-CM | POA: Diagnosis not present

## 2020-10-23 DIAGNOSIS — G8929 Other chronic pain: Secondary | ICD-10-CM | POA: Diagnosis not present

## 2020-10-23 DIAGNOSIS — Z992 Dependence on renal dialysis: Secondary | ICD-10-CM | POA: Diagnosis not present

## 2020-10-23 DIAGNOSIS — Z23 Encounter for immunization: Secondary | ICD-10-CM | POA: Diagnosis not present

## 2020-10-23 DIAGNOSIS — R519 Headache, unspecified: Secondary | ICD-10-CM | POA: Diagnosis not present

## 2020-10-23 DIAGNOSIS — N2581 Secondary hyperparathyroidism of renal origin: Secondary | ICD-10-CM | POA: Diagnosis not present

## 2020-10-23 DIAGNOSIS — E1129 Type 2 diabetes mellitus with other diabetic kidney complication: Secondary | ICD-10-CM | POA: Diagnosis not present

## 2020-10-23 DIAGNOSIS — N186 End stage renal disease: Secondary | ICD-10-CM | POA: Diagnosis not present

## 2020-10-23 DIAGNOSIS — D631 Anemia in chronic kidney disease: Secondary | ICD-10-CM | POA: Diagnosis not present

## 2020-10-24 ENCOUNTER — Encounter (HOSPITAL_COMMUNITY): Payer: Self-pay | Admitting: Vascular Surgery

## 2020-10-24 ENCOUNTER — Other Ambulatory Visit
Admission: RE | Admit: 2020-10-24 | Discharge: 2020-10-24 | Disposition: A | Discharge status: Home / Self Care | Payer: Medicare Other | Source: Ambulatory Visit | Attending: Vascular Surgery | Admitting: Vascular Surgery

## 2020-10-24 ENCOUNTER — Other Ambulatory Visit: Payer: Self-pay

## 2020-10-24 DIAGNOSIS — Z20822 Contact with and (suspected) exposure to covid-19: Secondary | ICD-10-CM | POA: Diagnosis not present

## 2020-10-24 DIAGNOSIS — Z01812 Encounter for preprocedural laboratory examination: Secondary | ICD-10-CM | POA: Diagnosis not present

## 2020-10-24 NOTE — Progress Notes (Addendum)
Ms [name] denies chest pain, patient gets short of breath with much ambulation. Ms [name] was tested for Covid today and has been in quarantine since that time, patient will go to dialysis tomorrow, using precautions.  Ms [name] has type II diabetic, patient does not check CBGs, "at dialysis it was 105." I instructed patient to not take any diabetic medications ton the morning of surgery.

## 2020-10-25 DIAGNOSIS — Z23 Encounter for immunization: Secondary | ICD-10-CM | POA: Diagnosis not present

## 2020-10-25 DIAGNOSIS — E1129 Type 2 diabetes mellitus with other diabetic kidney complication: Secondary | ICD-10-CM | POA: Diagnosis not present

## 2020-10-25 DIAGNOSIS — N2581 Secondary hyperparathyroidism of renal origin: Secondary | ICD-10-CM | POA: Diagnosis not present

## 2020-10-25 DIAGNOSIS — D631 Anemia in chronic kidney disease: Secondary | ICD-10-CM | POA: Diagnosis not present

## 2020-10-25 DIAGNOSIS — D688 Other specified coagulation defects: Secondary | ICD-10-CM | POA: Diagnosis not present

## 2020-10-25 DIAGNOSIS — N186 End stage renal disease: Secondary | ICD-10-CM | POA: Diagnosis not present

## 2020-10-25 DIAGNOSIS — G8929 Other chronic pain: Secondary | ICD-10-CM | POA: Diagnosis not present

## 2020-10-25 DIAGNOSIS — R519 Headache, unspecified: Secondary | ICD-10-CM | POA: Diagnosis not present

## 2020-10-25 DIAGNOSIS — D509 Iron deficiency anemia, unspecified: Secondary | ICD-10-CM | POA: Diagnosis not present

## 2020-10-25 DIAGNOSIS — Z992 Dependence on renal dialysis: Secondary | ICD-10-CM | POA: Diagnosis not present

## 2020-10-25 LAB — SARS CORONAVIRUS 2 (TAT 6-24 HRS): SARS Coronavirus 2: NEGATIVE

## 2020-10-27 ENCOUNTER — Other Ambulatory Visit: Payer: Self-pay

## 2020-10-27 ENCOUNTER — Ambulatory Visit (HOSPITAL_COMMUNITY): Payer: Medicare Other | Admitting: Anesthesiology

## 2020-10-27 ENCOUNTER — Encounter (HOSPITAL_COMMUNITY): Payer: Self-pay | Admitting: Vascular Surgery

## 2020-10-27 ENCOUNTER — Ambulatory Visit (HOSPITAL_COMMUNITY)
Admission: RE | Admit: 2020-10-27 | Discharge: 2020-10-27 | Disposition: A | Discharge status: Home / Self Care | Payer: Medicare Other | Attending: Vascular Surgery | Admitting: Vascular Surgery

## 2020-10-27 ENCOUNTER — Encounter (HOSPITAL_COMMUNITY)
Admission: RE | Disposition: A | Discharge status: Home / Self Care | Payer: Self-pay | Source: Home / Self Care | Attending: Vascular Surgery

## 2020-10-27 DIAGNOSIS — I120 Hypertensive chronic kidney disease with stage 5 chronic kidney disease or end stage renal disease: Secondary | ICD-10-CM | POA: Diagnosis not present

## 2020-10-27 DIAGNOSIS — N186 End stage renal disease: Secondary | ICD-10-CM | POA: Diagnosis not present

## 2020-10-27 DIAGNOSIS — Z87891 Personal history of nicotine dependence: Secondary | ICD-10-CM | POA: Diagnosis not present

## 2020-10-27 DIAGNOSIS — E7800 Pure hypercholesterolemia, unspecified: Secondary | ICD-10-CM | POA: Diagnosis not present

## 2020-10-27 DIAGNOSIS — Z79899 Other long term (current) drug therapy: Secondary | ICD-10-CM | POA: Insufficient documentation

## 2020-10-27 DIAGNOSIS — N185 Chronic kidney disease, stage 5: Secondary | ICD-10-CM | POA: Diagnosis not present

## 2020-10-27 DIAGNOSIS — E1122 Type 2 diabetes mellitus with diabetic chronic kidney disease: Secondary | ICD-10-CM | POA: Diagnosis not present

## 2020-10-27 DIAGNOSIS — D631 Anemia in chronic kidney disease: Secondary | ICD-10-CM | POA: Diagnosis not present

## 2020-10-27 HISTORY — DX: Personal history of other medical treatment: Z92.89

## 2020-10-27 HISTORY — PX: BASCILIC VEIN TRANSPOSITION: SHX5742

## 2020-10-27 HISTORY — DX: Dyspnea, unspecified: R06.00

## 2020-10-27 HISTORY — DX: Fibromyalgia: M79.7

## 2020-10-27 HISTORY — DX: End stage renal disease: N18.6

## 2020-10-27 LAB — POCT I-STAT, CHEM 8
BUN: 36 mg/dL — ABNORMAL HIGH (ref 8–23)
Calcium, Ion: 1.12 mmol/L — ABNORMAL LOW (ref 1.15–1.40)
Chloride: 96 mmol/L — ABNORMAL LOW (ref 98–111)
Creatinine, Ser: 3.1 mg/dL — ABNORMAL HIGH (ref 0.44–1.00)
Glucose, Bld: 157 mg/dL — ABNORMAL HIGH (ref 70–99)
HCT: 35 % — ABNORMAL LOW (ref 36.0–46.0)
Hemoglobin: 11.9 g/dL — ABNORMAL LOW (ref 12.0–15.0)
Potassium: 3.3 mmol/L — ABNORMAL LOW (ref 3.5–5.1)
Sodium: 137 mmol/L (ref 135–145)
TCO2: 28 mmol/L (ref 22–32)

## 2020-10-27 LAB — GLUCOSE, CAPILLARY
Glucose-Capillary: 145 mg/dL — ABNORMAL HIGH (ref 70–99)
Glucose-Capillary: 127 mg/dL — ABNORMAL HIGH (ref 70–99)
Glucose-Capillary: 144 mg/dL — ABNORMAL HIGH (ref 70–99)

## 2020-10-27 SURGERY — TRANSPOSITION, VEIN, BASILIC
Anesthesia: Monitor Anesthesia Care | Site: Arm Upper | Laterality: Right

## 2020-10-27 MED ORDER — 0.9 % SODIUM CHLORIDE (POUR BTL) OPTIME
TOPICAL | Status: DC | PRN
  Administered 2020-10-27: 1000 mL

## 2020-10-27 MED ORDER — CHLORHEXIDINE GLUCONATE 4 % EX LIQD: 60.0000 mL | Freq: Once | CUTANEOUS | Status: DC

## 2020-10-27 MED ORDER — HEPARIN SODIUM (PORCINE) 1000 UNIT/ML IJ SOLN
INTRAMUSCULAR | Status: DC | PRN
  Administered 2020-10-27: 7000 [IU] via INTRAVENOUS

## 2020-10-27 MED ORDER — FENTANYL CITRATE (PF) 250 MCG/5ML IJ SOLN
INTRAMUSCULAR | Status: DC | PRN
Start: 2020-10-27 — End: 2020-10-27
  Administered 2020-10-27 (×3): 50 ug via INTRAVENOUS

## 2020-10-27 MED ORDER — PROTAMINE SULFATE 10 MG/ML IV SOLN
INTRAVENOUS | Status: AC
  Filled 2020-10-27: qty 10

## 2020-10-27 MED ORDER — SODIUM CHLORIDE 0.9 % IV SOLN: INTRAVENOUS | Status: DC

## 2020-10-27 MED ORDER — HEPARIN SODIUM (PORCINE) 1000 UNIT/ML IJ SOLN
INTRAMUSCULAR | Status: AC
  Filled 2020-10-27: qty 1

## 2020-10-27 MED ORDER — PROTAMINE SULFATE 10 MG/ML IV SOLN
INTRAVENOUS | Status: DC | PRN
  Administered 2020-10-27: 70 mg via INTRAVENOUS

## 2020-10-27 MED ORDER — CEFAZOLIN SODIUM-DEXTROSE 2-4 GM/100ML-% IV SOLN
2.0000 g | INTRAVENOUS | Status: AC
  Administered 2020-10-27: 2 g via INTRAVENOUS

## 2020-10-27 MED ORDER — SODIUM CHLORIDE 0.9 % IV SOLN
INTRAVENOUS | Status: AC
  Filled 2020-10-27: qty 1.2

## 2020-10-27 MED ORDER — LIDOCAINE HCL (PF) 1 % IJ SOLN
INTRAMUSCULAR | Status: AC
  Filled 2020-10-27: qty 30

## 2020-10-27 MED ORDER — ONDANSETRON HCL 4 MG/2ML IJ SOLN
INTRAMUSCULAR | Status: AC
  Filled 2020-10-27: qty 2

## 2020-10-27 MED ORDER — FENTANYL CITRATE (PF) 100 MCG/2ML IJ SOLN
25.0000 ug | INTRAMUSCULAR | Status: DC | PRN
  Administered 2020-10-27: 25 ug via INTRAVENOUS

## 2020-10-27 MED ORDER — OXYCODONE HCL 5 MG PO TABS
5.0000 mg | ORAL_TABLET | Freq: Four times a day (QID) | ORAL | 0 refills | Status: DC | PRN
Start: 2020-10-27 — End: 2021-01-28

## 2020-10-27 MED ORDER — FENTANYL CITRATE (PF) 100 MCG/2ML IJ SOLN
INTRAMUSCULAR | Status: AC
  Administered 2020-10-27: 25 ug via INTRAVENOUS
  Filled 2020-10-27: qty 2

## 2020-10-27 MED ORDER — SODIUM CHLORIDE 0.9 % IV SOLN
INTRAVENOUS | Status: DC | PRN
  Administered 2020-10-27: 500 mL

## 2020-10-27 MED ORDER — PROPOFOL 10 MG/ML IV BOLUS
INTRAVENOUS | Status: AC
  Filled 2020-10-27: qty 20

## 2020-10-27 MED ORDER — CHLORHEXIDINE GLUCONATE 0.12 % MT SOLN: 15.0000 mL | Freq: Once | OROMUCOSAL | Status: AC

## 2020-10-27 MED ORDER — DEXAMETHASONE SODIUM PHOSPHATE 10 MG/ML IJ SOLN
INTRAMUSCULAR | Status: AC
  Filled 2020-10-27: qty 1

## 2020-10-27 MED ORDER — FENTANYL CITRATE (PF) 250 MCG/5ML IJ SOLN
INTRAMUSCULAR | Status: AC
  Filled 2020-10-27: qty 5

## 2020-10-27 MED ORDER — PROPOFOL 10 MG/ML IV BOLUS
INTRAVENOUS | Status: DC | PRN
  Administered 2020-10-27: 150 mg via INTRAVENOUS

## 2020-10-27 MED ORDER — ONDANSETRON HCL 4 MG/2ML IJ SOLN
INTRAMUSCULAR | Status: DC | PRN
  Administered 2020-10-27: 4 mg via INTRAVENOUS

## 2020-10-27 MED ORDER — CEFAZOLIN SODIUM-DEXTROSE 2-4 GM/100ML-% IV SOLN
INTRAVENOUS | Status: AC
  Filled 2020-10-27: qty 100

## 2020-10-27 MED ORDER — PHENYLEPHRINE HCL (PRESSORS) 10 MG/ML IV SOLN
INTRAVENOUS | Status: DC | PRN
  Administered 2020-10-27 (×2): 80 ug via INTRAVENOUS

## 2020-10-27 MED ORDER — ORAL CARE MOUTH RINSE: 15.0000 mL | Freq: Once | OROMUCOSAL | Status: AC

## 2020-10-27 MED ORDER — CHLORHEXIDINE GLUCONATE 0.12 % MT SOLN
OROMUCOSAL | Status: AC
  Administered 2020-10-27: 15 mL via OROMUCOSAL
  Filled 2020-10-27: qty 15

## 2020-10-27 MED ORDER — DEXAMETHASONE SODIUM PHOSPHATE 10 MG/ML IJ SOLN
INTRAMUSCULAR | Status: DC | PRN
  Administered 2020-10-27: 5 mg via INTRAVENOUS

## 2020-10-27 MED ORDER — PHENYLEPHRINE 40 MCG/ML (10ML) SYRINGE FOR IV PUSH (FOR BLOOD PRESSURE SUPPORT)
PREFILLED_SYRINGE | INTRAVENOUS | Status: AC
  Filled 2020-10-27: qty 10

## 2020-10-27 MED ORDER — LIDOCAINE 2% (20 MG/ML) 5 ML SYRINGE
INTRAMUSCULAR | Status: AC
  Filled 2020-10-27: qty 5

## 2020-10-27 MED ORDER — ONDANSETRON HCL 4 MG/2ML IJ SOLN: 4.0000 mg | Freq: Once | INTRAMUSCULAR | Status: DC | PRN

## 2020-10-27 MED ORDER — LIDOCAINE HCL (CARDIAC) PF 100 MG/5ML IV SOSY
PREFILLED_SYRINGE | INTRAVENOUS | Status: DC | PRN
  Administered 2020-10-27: 80 mg via INTRATRACHEAL

## 2020-10-27 MED ORDER — PHENYLEPHRINE HCL-NACL 10-0.9 MG/250ML-% IV SOLN
INTRAVENOUS | Status: DC | PRN
  Administered 2020-10-27: 25 ug/min via INTRAVENOUS

## 2020-10-27 MED ORDER — ALBUMIN HUMAN 5 % IV SOLN: INTRAVENOUS | Status: DC | PRN

## 2020-10-27 SURGICAL SUPPLY — 47 items
ADH SKN CLS APL DERMABOND .7 (GAUZE/BANDAGES/DRESSINGS) ×1
AGENT HMST SPONGE THK3/8 (HEMOSTASIS)
ARMBAND PINK RESTRICT EXTREMIT (MISCELLANEOUS) ×3 IMPLANT
CANISTER SUCT 3000ML PPV (MISCELLANEOUS) ×3 IMPLANT
CANNULA VESSEL 3MM 2 BLNT TIP (CANNULA) ×3 IMPLANT
CLIP VESOCCLUDE MED 24/CT (CLIP) ×3 IMPLANT
CLIP VESOCCLUDE SM WIDE 24/CT (CLIP) ×3 IMPLANT
COVER PROBE W GEL 5X96 (DRAPES) ×2 IMPLANT
COVER WAND RF STERILE (DRAPES) ×3 IMPLANT
DECANTER SPIKE VIAL GLASS SM (MISCELLANEOUS) ×1 IMPLANT
DERMABOND ADVANCED (GAUZE/BANDAGES/DRESSINGS) ×2
DERMABOND ADVANCED .7 DNX12 (GAUZE/BANDAGES/DRESSINGS) ×1 IMPLANT
DRAIN PENROSE 1/4X12 LTX STRL (WOUND CARE) IMPLANT
ELECT REM PT RETURN 9FT ADLT (ELECTROSURGICAL) ×3
ELECTRODE REM PT RTRN 9FT ADLT (ELECTROSURGICAL) ×1 IMPLANT
GLOVE BIO SURGEON STRL SZ7.5 (GLOVE) ×3 IMPLANT
GLOVE BIOGEL PI IND STRL 6.5 (GLOVE) IMPLANT
GLOVE BIOGEL PI IND STRL 7.0 (GLOVE) IMPLANT
GLOVE BIOGEL PI IND STRL 7.5 (GLOVE) IMPLANT
GLOVE BIOGEL PI INDICATOR 6.5 (GLOVE) ×2
GLOVE BIOGEL PI INDICATOR 7.0 (GLOVE) ×2
GLOVE BIOGEL PI INDICATOR 7.5 (GLOVE) ×2
GLOVE ECLIPSE 7.0 STRL STRAW (GLOVE) ×2 IMPLANT
GLOVE ECLIPSE 8.0 STRL XLNG CF (GLOVE) ×2 IMPLANT
GOWN STRL REUS W/ TWL LRG LVL3 (GOWN DISPOSABLE) ×3 IMPLANT
GOWN STRL REUS W/TWL LRG LVL3 (GOWN DISPOSABLE) ×9
HEMOSTAT SPONGE AVITENE ULTRA (HEMOSTASIS) IMPLANT
KIT BASIN OR (CUSTOM PROCEDURE TRAY) ×3 IMPLANT
KIT TURNOVER KIT B (KITS) ×3 IMPLANT
LOOP VESSEL MINI RED (MISCELLANEOUS) IMPLANT
NS IRRIG 1000ML POUR BTL (IV SOLUTION) ×3 IMPLANT
PACK CV ACCESS (CUSTOM PROCEDURE TRAY) ×3 IMPLANT
PAD ARMBOARD 7.5X6 YLW CONV (MISCELLANEOUS) ×6 IMPLANT
SUT PROLENE 6 0 C 1 24 (SUTURE) ×4 IMPLANT
SUT PROLENE 6 0 CC (SUTURE) ×12 IMPLANT
SUT PROLENE 7 0 BV 1 (SUTURE) ×1 IMPLANT
SUT SILK 2 0 SH (SUTURE) ×2 IMPLANT
SUT SILK 3 0 (SUTURE) ×6
SUT SILK 3-0 18XBRD TIE 12 (SUTURE) IMPLANT
SUT VIC AB 3-0 SH 27 (SUTURE) ×9
SUT VIC AB 3-0 SH 27X BRD (SUTURE) ×1 IMPLANT
SUT VICRYL 4-0 PS2 18IN ABS (SUTURE) ×7 IMPLANT
SYR BULB IRRIG 60ML STRL (SYRINGE) ×2 IMPLANT
TAPE UMBILICAL COTTON 1/8X30 (MISCELLANEOUS) ×2 IMPLANT
TOWEL GREEN STERILE (TOWEL DISPOSABLE) ×3 IMPLANT
UNDERPAD 30X36 HEAVY ABSORB (UNDERPADS AND DIAPERS) ×3 IMPLANT
WATER STERILE IRR 1000ML POUR (IV SOLUTION) ×3 IMPLANT

## 2020-10-27 NOTE — OR Nursing (Signed)
Pt is awake,alert and oriented. Pt is in NAD at this time. Pt and family verbalized understanding of poc and discharge instructions. instructions given to family and reviewed prior to discharge.Pt is ambulatory to wheelchair with stand by assist but not needed with steady gait.  Pt taken to front lobby and placed in car with family.   

## 2020-10-27 NOTE — Op Note (Signed)
Procedure: Second stage basilic vein transposition fistula right arm  Preoperative diagnosis: End-stage renal disease  Postoperative diagnosis: Same  Anesthesia: General  Assistant: Gerri Lins, PA-C for expediting procedure  Operative findings: 5 to 6 mm basilic vein thin-walled  Operative details: After pain informed consent, patient taken the operating.  The patient placed supine position operating table.  After induction general anesthesia patient's right upper extremities prepped and draped in usual sterile fashion.  Next ultrasound was used to identify the course of the pre-existing for stage basilic vein fistula.  Longitudinal incision made near the antecubital crease carried on through subcutaneous tissues down the level AV fistula.  It was then dissected free circumferentially small side branches were ligated divided tween silk ties.  2 additional stab incisions were made up the arm to harvest the vein all the way up to the level of the axilla.  The vein was about 5 to 6 mm throughout its course.  The patient was then given 7000 intravenous heparin.  The vein was transected just above its anastomosis to the brachial artery.  It was marked for orientation and brought through a subcutaneous tunnel arcing over the biceps muscle.  The vein was sterilely thin-walled.  I did have to place 1 repair stitch up near the axilla.  After the vein was tunneled it was then reanastomosed end-to-end using a running 6-0 Prolene suture.  Just prior to completion anastomosis it was for blood backbled and thoroughly flushed.  Anastomosis was secured clamps released was pop thrill in the fistula immediately.  Hemostasis was obtained with direct pressure and 50 mg of protamine.  Subcutaneous incisions were closed in all 3 incisions using a running 3-0 Vicryl suture.  Skin of all 3 incisions was closed with 4-0 Vicryl subcuticular stitch.  Patient tired procedure well and there were no complications.  Insert sponge  and needle counts correct in the case.  Patient was taken the recovery room in stable condition.  [name], MD [facility] [place] Office: [phone]

## 2020-10-27 NOTE — Interval H&P Note (Signed)
History and Physical Interval Note:  10/27/2020 1:40 PM  [name]  has presented today for surgery, with the diagnosis of END STAGE RENAL DISEASE.  The various methods of treatment have been discussed with the patient and family. After consideration of risks, benefits and other options for treatment, the patient has consented to  Procedure(s): RIGHT UPPER EXTREMITY SECOND STAGE [place] (Right) as a surgical intervention.  The patient's history has been reviewed, patient examined, no change in status, stable for surgery.  I have reviewed the patient's chart and labs.  Questions were answered to the patient's satisfaction.     [name]

## 2020-10-27 NOTE — Anesthesia Procedure Notes (Signed)
Procedure Name: LMA Insertion Date/Time: 10/27/2020 2:07 PM Performed by: Kathryne Hitch, CRNA Pre-anesthesia Checklist: Emergency Drugs available, Patient identified, Patient being monitored and Suction available Patient Re-evaluated:Patient Re-evaluated prior to induction Oxygen Delivery Method: Circle system utilized Preoxygenation: Pre-oxygenation with 100% oxygen Induction Type: IV induction Ventilation: Mask ventilation without difficulty LMA: LMA inserted LMA Size: 4.0 Number of attempts: 1 Placement Confirmation: positive ETCO2 and breath sounds checked- equal and bilateral Tube secured with: Tape Dental Injury: Teeth and Oropharynx as per pre-operative assessment

## 2020-10-27 NOTE — Anesthesia Postprocedure Evaluation (Signed)
Anesthesia Post Note  Patient: [name]  Procedure(s) Performed: RIGHT UPPER EXTREMITY SECOND STAGE BASCILIC VEIN TRANSPOSITION (Right Arm Upper)     Patient location during evaluation: PACU Anesthesia Type: MAC Level of consciousness: awake and alert Pain management: pain level controlled Vital Signs Assessment: post-procedure vital signs reviewed and stable Respiratory status: spontaneous breathing, nonlabored ventilation, respiratory function stable and patient connected to nasal cannula oxygen Cardiovascular status: stable and blood pressure returned to baseline Postop Assessment: no apparent nausea or vomiting Anesthetic complications: no   No complications documented.  Last Vitals:  Vitals:   10/27/20 1715 10/27/20 1730  BP: 134/65 137/73  Pulse: 83 84  Resp: 20 20  Temp:  36.8 C  SpO2: 97% 93%    Last Pain:  Vitals:   10/27/20 1730  TempSrc:   PainSc: 2                  [name]

## 2020-10-27 NOTE — Anesthesia Preprocedure Evaluation (Addendum)
Anesthesia Evaluation  Patient identified by MRN, date of birth, ID band Patient awake    Reviewed: Allergy & Precautions, NPO status , Patient's Chart, lab work & pertinent test results  Airway Mallampati: II  TM Distance: >3 FB Neck ROM: Full    Dental  (+) Dental Advisory Given   Pulmonary shortness of breath, pneumonia, former smoker,    Pulmonary exam normal breath sounds clear to auscultation       Cardiovascular hypertension, Pt. on medications and Pt. on home beta blockers Normal cardiovascular exam Rhythm:Regular Rate:Normal     Neuro/Psych  Headaches, PSYCHIATRIC DISORDERS Anxiety Depression    GI/Hepatic Neg liver ROS, GERD  ,  Endo/Other  diabetesMorbid obesity  Renal/GU Renal disease     Musculoskeletal  (+) Arthritis , Fibromyalgia -  Abdominal (+) + obese,   Peds  Hematology  (+) Blood dyscrasia, anemia ,   Anesthesia Other Findings   Reproductive/Obstetrics                            Anesthesia Physical Anesthesia Plan  ASA: III  Anesthesia Plan: MAC   Post-op Pain Management:    Induction: Intravenous  PONV Risk Score and Plan:   Airway Management Planned: Natural Airway  Additional Equipment: None  Intra-op Plan:   Post-operative Plan: Extubation in OR  Informed Consent: I have reviewed the patients History and Physical, chart, labs and discussed the procedure including the risks, benefits and alternatives for the proposed anesthesia with the patient or authorized representative who has indicated his/her understanding and acceptance.     Dental advisory given  Plan Discussed with: CRNA  Anesthesia Plan Comments:        Anesthesia Quick Evaluation

## 2020-10-27 NOTE — Transfer of Care (Signed)
Immediate Anesthesia Transfer of Care Note  Patient: [name]  Procedure(s) Performed: RIGHT UPPER EXTREMITY SECOND STAGE BASCILIC VEIN TRANSPOSITION (Right Arm Upper)  Patient Location: PACU  Anesthesia Type:General  Level of Consciousness: patient cooperative  Airway & Oxygen Therapy: Patient Spontanous Breathing and Patient connected to face mask oxygen  Post-op Assessment: Report given to RN and Post -op Vital signs reviewed and stable  Post vital signs: Reviewed and stable  Last Vitals:  Vitals Value Taken Time  BP 130/58   Temp    Pulse 85   Resp 11   SpO2 100     Last Pain:  Vitals:   10/27/20 1045  TempSrc:   PainSc: 3       Patients Stated Pain Goal: 3 (36/[id number])  Complications: No complications documented.

## 2020-10-28 ENCOUNTER — Encounter (HOSPITAL_COMMUNITY): Payer: Self-pay | Admitting: Vascular Surgery

## 2020-10-28 DIAGNOSIS — N186 End stage renal disease: Secondary | ICD-10-CM | POA: Diagnosis not present

## 2020-10-28 DIAGNOSIS — D631 Anemia in chronic kidney disease: Secondary | ICD-10-CM | POA: Diagnosis not present

## 2020-10-28 DIAGNOSIS — D688 Other specified coagulation defects: Secondary | ICD-10-CM | POA: Diagnosis not present

## 2020-10-28 DIAGNOSIS — G8929 Other chronic pain: Secondary | ICD-10-CM | POA: Diagnosis not present

## 2020-10-28 DIAGNOSIS — D509 Iron deficiency anemia, unspecified: Secondary | ICD-10-CM | POA: Diagnosis not present

## 2020-10-28 DIAGNOSIS — N2581 Secondary hyperparathyroidism of renal origin: Secondary | ICD-10-CM | POA: Diagnosis not present

## 2020-10-28 DIAGNOSIS — Z992 Dependence on renal dialysis: Secondary | ICD-10-CM | POA: Diagnosis not present

## 2020-10-28 DIAGNOSIS — R519 Headache, unspecified: Secondary | ICD-10-CM | POA: Diagnosis not present

## 2020-10-28 DIAGNOSIS — Z23 Encounter for immunization: Secondary | ICD-10-CM | POA: Diagnosis not present

## 2020-10-28 DIAGNOSIS — E1129 Type 2 diabetes mellitus with other diabetic kidney complication: Secondary | ICD-10-CM | POA: Diagnosis not present

## 2020-10-29 ENCOUNTER — Other Ambulatory Visit: Payer: Self-pay

## 2020-10-29 ENCOUNTER — Encounter: Payer: Self-pay | Admitting: Internal Medicine

## 2020-10-29 ENCOUNTER — Other Ambulatory Visit (HOSPITAL_COMMUNITY): Payer: Self-pay | Admitting: Internal Medicine

## 2020-10-29 ENCOUNTER — Ambulatory Visit (INDEPENDENT_AMBULATORY_CARE_PROVIDER_SITE_OTHER): Payer: Medicare Other | Admitting: Internal Medicine

## 2020-10-29 ENCOUNTER — Ambulatory Visit: Payer: Medicare Other | Admitting: Internal Medicine

## 2020-10-29 DIAGNOSIS — F332 Major depressive disorder, recurrent severe without psychotic features: Secondary | ICD-10-CM | POA: Diagnosis not present

## 2020-10-29 DIAGNOSIS — G8929 Other chronic pain: Secondary | ICD-10-CM

## 2020-10-29 DIAGNOSIS — E119 Type 2 diabetes mellitus without complications: Secondary | ICD-10-CM | POA: Diagnosis not present

## 2020-10-29 DIAGNOSIS — M545 Low back pain, unspecified: Secondary | ICD-10-CM

## 2020-10-29 DIAGNOSIS — I10 Essential (primary) hypertension: Secondary | ICD-10-CM | POA: Diagnosis not present

## 2020-10-29 DIAGNOSIS — F419 Anxiety disorder, unspecified: Secondary | ICD-10-CM

## 2020-10-29 DIAGNOSIS — R413 Other amnesia: Secondary | ICD-10-CM

## 2020-10-29 MED ORDER — CYCLOBENZAPRINE HCL 10 MG PO TABS
10.0000 mg | ORAL_TABLET | Freq: Three times a day (TID) | ORAL | 3 refills | Status: DC | PRN
  Filled 2021-04-08 (×2): qty 270, 90d supply, fill #0

## 2020-10-29 MED ORDER — LORAZEPAM 2 MG PO TABS: ORAL_TABLET | ORAL | 1 refills | Status: DC

## 2020-10-29 MED ORDER — GLIMEPIRIDE 1 MG PO TABS
1.0000 mg | ORAL_TABLET | Freq: Every day | ORAL | 3 refills | Status: DC
Start: 2020-10-29 — End: 2021-11-09

## 2020-10-29 MED ORDER — PILOCARPINE HCL 5 MG PO TABS
5.0000 mg | ORAL_TABLET | Freq: Two times a day (BID) | ORAL | 3 refills | Status: DC
Start: 2020-10-29 — End: 2021-11-23

## 2020-10-29 MED ORDER — PANTOPRAZOLE SODIUM 40 MG PO TBEC
40.0000 mg | DELAYED_RELEASE_TABLET | Freq: Every day | ORAL | 3 refills | Status: DC
Start: 2020-10-29 — End: 2020-12-15

## 2020-10-29 MED ORDER — BUPROPION HCL ER (XL) 300 MG PO TB24
300.0000 mg | ORAL_TABLET | Freq: Every day | ORAL | 3 refills | Status: DC
Start: 2020-10-29 — End: 2021-12-21

## 2020-10-29 MED ORDER — DULOXETINE HCL 60 MG PO CPEP
60.0000 mg | ORAL_CAPSULE | Freq: Every day | ORAL | 3 refills | Status: DC
Start: 2020-10-29 — End: 2020-12-15

## 2020-10-29 MED ORDER — PIOGLITAZONE HCL 15 MG PO TABS
15.0000 mg | ORAL_TABLET | Freq: Every day | ORAL | 3 refills | Status: DC
Start: 2020-10-29 — End: 2021-12-25

## 2020-10-29 MED ORDER — SOLIFENACIN SUCCINATE 5 MG PO TABS
5.0000 mg | ORAL_TABLET | Freq: Every day | ORAL | 3 refills | Status: DC
Start: 2020-10-29 — End: 2020-12-15

## 2020-10-29 NOTE — Assessment & Plan Note (Signed)
On HD 07/2020

## 2020-10-29 NOTE — Progress Notes (Signed)
Subjective:  Patient ID: [name], female    DOB: 03-Jun-1951  Age: 68 y.o. MRN: [id number]  CC: Follow-up (3 MONTH F/U)   HPI [name] presents for CRF - ESRD on HD.  Follow-up on diabetes, chronic back pain, depression.  She comes with her sister. C/o ST after intubation-it is getting better.      Outpatient Medications Prior to Visit  Medication Sig Dispense Refill  . acetaminophen (TYLENOL) 500 MG tablet Take 1,000 mg by mouth every 6 (six) hours as needed for moderate pain or headache.    Marland Kitchen amoxicillin (AMOXIL) 500 MG capsule TAKE 4 CAPSULES BY MOUTH 1  HOUR PRIOR TO DENTAL  PROCEDURE/CLEANING (Patient taking differently: Take 2,000 mg by mouth See admin instructions. Take 2000 mg 1 hour prior to dental work) 16 capsule 1  . blood glucose meter kit and supplies Dispense based on patient and insurance preference. Used to check blood sugar daily, DX: E11.9 OneTouch 1 each 0  . buPROPion (WELLBUTRIN XL) 300 MG 24 hr tablet Take 1 tablet (300 mg total) by mouth at bedtime. 90 tablet 1  . cyclobenzaprine (FLEXERIL) 10 MG tablet TAKE 3 TABLETS BY MOUTH AT  BEDTIME (Patient taking differently: Take 10 mg by mouth 3 (three) times daily as needed for muscle spasms. ) 270 tablet 1  . dicyclomine (BENTYL) 20 MG tablet Take one tablet every 4-6 hours as needed for abdominal pain (Patient taking differently: Take 20 mg by mouth every 4 (four) hours as needed (abdominal pain). ) 60 tablet 2  . DULoxetine (CYMBALTA) 60 MG capsule TAKE 1 CAPSULE BY MOUTH AT  BEDTIME (Patient taking differently: Take 60 mg by mouth at bedtime. ** Do NOT chew, crush, or open capsule **) 90 capsule 3  . fexofenadine (ALLEGRA) 180 MG tablet Take 1 tablet (180 mg total) by mouth daily. (Patient taking differently: Take 180 mg by mouth daily as needed for allergies. ) 90 tablet 3  . glimepiride (AMARYL) 1 MG tablet TAKE 1 TABLET BY MOUTH  DAILY BEFORE BREAKFAST (Patient taking differently: Take 1 mg by mouth daily  before breakfast. ) 90 tablet 3  . glucose blood (TRUETEST TEST) test strip Used to check blood sugar daily, DX: E11.9 100 each 3  . ketoconazole (NIZORAL) 2 % cream Apply 1 application topically daily as needed for irritation (skin fold (heat rash)).    Marland Kitchen ketoconazole (NIZORAL) 200 MG tablet Take 0.5 tablets (100 mg total) by mouth daily. (Patient taking differently: Take 100 mg by mouth daily as needed (rash/skin irritation persists). ) 20 tablet 2  . Lancets (ONETOUCH ULTRASOFT) lancets Used to check blood sugar daily, DX: E11.9 100 each 3  . lidocaine (XYLOCAINE) 5 % ointment Apply topically 3 (three) times daily as needed. (Patient taking differently: Apply 1 application topically 3 (three) times daily as needed for moderate pain. ) 100 g 3  . LORazepam (ATIVAN) 2 MG tablet 1 po qhs prn insomnia (Patient taking differently: Take 1-2 mg by mouth every 6 (six) hours as needed for anxiety or sleep. ) 90 tablet 2  . multivitamin (RENA-VIT) TABS tablet Take 1 tablet by mouth daily.    Marland Kitchen oxyCODONE (ROXICODONE) 15 MG immediate release tablet Take 1 tablet (15 mg total) by mouth every 6 (six) hours as needed for pain. 8 tablet 0  . oxyCODONE (ROXICODONE) 5 MG immediate release tablet Take 1 tablet (5 mg total) by mouth every 6 (six) hours as needed. 12 tablet 0  .  pantoprazole (PROTONIX) 40 MG tablet TAKE 1 TABLET BY MOUTH  DAILY (Patient taking differently: Take 40 mg by mouth daily. ) 90 tablet 3  . pilocarpine (SALAGEN) 5 MG tablet TAKE 1 TABLET BY MOUTH  TWICE DAILY (Patient taking differently: Take 10 mg by mouth 2 (two) times daily. ) 180 tablet 1  . pioglitazone (ACTOS) 15 MG tablet TAKE 1 TABLET BY MOUTH  DAILY (Patient taking differently: Take 15 mg by mouth daily. ) 90 tablet 3  . polyethylene glycol (MIRALAX / GLYCOLAX) 17 g packet Take 17 g by mouth daily as needed (constipation.).    Marland Kitchen polyvinyl alcohol (LIQUIFILM TEARS) 1.4 % ophthalmic solution Place 1 drop into both eyes daily as needed  for dry eyes.    Marland Kitchen solifenacin (VESICARE) 5 MG tablet TAKE 1 TABLET BY MOUTH  DAILY (Patient taking differently: Take 5 mg by mouth daily. ) 90 tablet 3  . triamcinolone (NASACORT) 55 MCG/ACT AERO nasal inhaler Place 2 sprays into the nose daily. (Patient taking differently: Place 2 sprays into the nose daily as needed (allergies). ) 3 each 3  . Menthol-Methyl Salicylate (SALONPAS PAIN RELIEF PATCH EX) Apply 2 patches topically daily as needed (shoulder pain). (Patient not taking: Reported on 10/29/2020)     No facility-administered medications prior to visit.    ROS: Review of Systems  Constitutional: Positive for fatigue. Negative for activity change, appetite change, chills and unexpected weight change.  HENT: Negative for congestion, mouth sores and sinus pressure.   Eyes: Negative for visual disturbance.  Respiratory: Negative for cough and chest tightness.   Gastrointestinal: Negative for abdominal pain and nausea.  Genitourinary: Negative for difficulty urinating, frequency and vaginal pain.  Musculoskeletal: Positive for arthralgias, back pain and gait problem.  Skin: Negative for pallor and rash.  Neurological: Positive for weakness. Negative for dizziness, tremors, numbness and headaches.  Hematological: Bruises/bleeds easily.  Psychiatric/Behavioral: Positive for dysphoric mood. Negative for confusion and sleep disturbance.    Objective:  BP 138/60 (BP Location: Left Arm)   Pulse 98   Temp 98.1 F (36.7 C) (Oral)   Wt 231 lb 3.2 oz (104.9 kg)   SpO2 96%   BMI 42.29 kg/m   BP Readings from Last 3 Encounters:  10/29/20 138/60  10/27/20 137/73  10/15/20 140/71    Wt Readings from Last 3 Encounters:  10/29/20 231 lb 3.2 oz (104.9 kg)  10/27/20 232 lb 9.4 oz (105.5 kg)  10/15/20 233 lb 9.6 oz (106 kg)    Physical Exam Constitutional:      General: She is not in acute distress.    Appearance: She is well-developed. She is obese.  HENT:     Head: Normocephalic.       Right Ear: External ear normal.     Left Ear: External ear normal.     Nose: Nose normal.  Eyes:     General:        Right eye: No discharge.        Left eye: No discharge.     Conjunctiva/sclera: Conjunctivae normal.     Pupils: Pupils are equal, round, and reactive to light.  Neck:     Thyroid: No thyromegaly.     Vascular: No JVD.     Trachea: No tracheal deviation.  Cardiovascular:     Rate and Rhythm: Normal rate and regular rhythm.     Heart sounds: Normal heart sounds.  Pulmonary:     Effort: No respiratory distress.     Breath  sounds: No stridor. No wheezing.  Abdominal:     General: Bowel sounds are normal. There is no distension.     Palpations: Abdomen is soft. There is no mass.     Tenderness: There is no abdominal tenderness. There is no guarding or rebound.  Musculoskeletal:        General: Tenderness present.     Cervical back: Normal range of motion and neck supple.     Right lower leg: Edema present.     Left lower leg: Edema present.  Lymphadenopathy:     Cervical: No cervical adenopathy.  Skin:    Findings: Bruising present. No erythema or rash.  Neurological:     Mental Status: She is oriented to person, place, and time.     Cranial Nerves: No cranial nerve deficit.     Motor: Weakness present. No abnormal muscle tone.     Coordination: Coordination abnormal.     Gait: Gait abnormal.     Deep Tendon Reflexes: Reflexes normal.  Psychiatric:        Behavior: Behavior normal.        Thought Content: Thought content normal.        Judgment: Judgment normal.     R vasc cath R arm fistula scar/bruise eryth throat   Lab Results  Component Value Date   WBC 10.0 04/16/2020   HGB 11.9 (L) 10/27/2020   HCT 35.0 (L) 10/27/2020   PLT 213.0 04/16/2020   GLUCOSE 157 (H) 10/27/2020   CHOL 215 (H) 09/25/2014   TRIG 154.0 (H) 09/25/2014   HDL 46.00 09/25/2014   LDLCALC 138 (H) 09/25/2014   ALT 15 04/16/2020   AST 18 04/16/2020   NA 137 10/27/2020    K 3.3 (L) 10/27/2020   CL 96 (L) 10/27/2020   CREATININE 3.10 (H) 10/27/2020   BUN 36 (H) 10/27/2020   CO2 27 07/28/2020   TSH 3.98 04/16/2020   INR 1.04 02/26/2015   HGBA1C 6.1 07/28/2020    No results found.  Assessment & Plan:    [name], MD

## 2020-10-29 NOTE — Assessment & Plan Note (Signed)
F/u w/Dr Nelva Bush

## 2020-10-29 NOTE — Assessment & Plan Note (Signed)
Lorazepam prn  Potential benefits of a long term benzodiazepines  use as well as potential risks  and complications were explained to the patient and were aknowledged. Not to take w/Oxycodone  

## 2020-10-29 NOTE — Assessment & Plan Note (Signed)
Better  

## 2020-10-29 NOTE — Assessment & Plan Note (Addendum)
Depressed On Wellbutrin XL, Cymbalta

## 2020-10-30 DIAGNOSIS — E1129 Type 2 diabetes mellitus with other diabetic kidney complication: Secondary | ICD-10-CM | POA: Diagnosis not present

## 2020-10-30 DIAGNOSIS — R519 Headache, unspecified: Secondary | ICD-10-CM | POA: Diagnosis not present

## 2020-10-30 DIAGNOSIS — N2581 Secondary hyperparathyroidism of renal origin: Secondary | ICD-10-CM | POA: Diagnosis not present

## 2020-10-30 DIAGNOSIS — Z23 Encounter for immunization: Secondary | ICD-10-CM | POA: Diagnosis not present

## 2020-10-30 DIAGNOSIS — D631 Anemia in chronic kidney disease: Secondary | ICD-10-CM | POA: Diagnosis not present

## 2020-10-30 DIAGNOSIS — Z992 Dependence on renal dialysis: Secondary | ICD-10-CM | POA: Diagnosis not present

## 2020-10-30 DIAGNOSIS — N186 End stage renal disease: Secondary | ICD-10-CM | POA: Diagnosis not present

## 2020-10-30 DIAGNOSIS — D509 Iron deficiency anemia, unspecified: Secondary | ICD-10-CM | POA: Diagnosis not present

## 2020-10-30 DIAGNOSIS — G8929 Other chronic pain: Secondary | ICD-10-CM | POA: Diagnosis not present

## 2020-10-30 DIAGNOSIS — D688 Other specified coagulation defects: Secondary | ICD-10-CM | POA: Diagnosis not present

## 2020-11-01 DIAGNOSIS — N2581 Secondary hyperparathyroidism of renal origin: Secondary | ICD-10-CM | POA: Diagnosis not present

## 2020-11-01 DIAGNOSIS — E1129 Type 2 diabetes mellitus with other diabetic kidney complication: Secondary | ICD-10-CM | POA: Diagnosis not present

## 2020-11-01 DIAGNOSIS — D688 Other specified coagulation defects: Secondary | ICD-10-CM | POA: Diagnosis not present

## 2020-11-01 DIAGNOSIS — R519 Headache, unspecified: Secondary | ICD-10-CM | POA: Diagnosis not present

## 2020-11-01 DIAGNOSIS — Z992 Dependence on renal dialysis: Secondary | ICD-10-CM | POA: Diagnosis not present

## 2020-11-01 DIAGNOSIS — N186 End stage renal disease: Secondary | ICD-10-CM | POA: Diagnosis not present

## 2020-11-01 DIAGNOSIS — G8929 Other chronic pain: Secondary | ICD-10-CM | POA: Diagnosis not present

## 2020-11-01 DIAGNOSIS — D509 Iron deficiency anemia, unspecified: Secondary | ICD-10-CM | POA: Diagnosis not present

## 2020-11-01 DIAGNOSIS — Z23 Encounter for immunization: Secondary | ICD-10-CM | POA: Diagnosis not present

## 2020-11-01 DIAGNOSIS — D631 Anemia in chronic kidney disease: Secondary | ICD-10-CM | POA: Diagnosis not present

## 2020-11-03 DIAGNOSIS — R519 Headache, unspecified: Secondary | ICD-10-CM | POA: Diagnosis not present

## 2020-11-03 DIAGNOSIS — Z23 Encounter for immunization: Secondary | ICD-10-CM | POA: Diagnosis not present

## 2020-11-03 DIAGNOSIS — N2581 Secondary hyperparathyroidism of renal origin: Secondary | ICD-10-CM | POA: Diagnosis not present

## 2020-11-03 DIAGNOSIS — D509 Iron deficiency anemia, unspecified: Secondary | ICD-10-CM | POA: Diagnosis not present

## 2020-11-03 DIAGNOSIS — Z992 Dependence on renal dialysis: Secondary | ICD-10-CM | POA: Diagnosis not present

## 2020-11-03 DIAGNOSIS — D688 Other specified coagulation defects: Secondary | ICD-10-CM | POA: Diagnosis not present

## 2020-11-03 DIAGNOSIS — E1129 Type 2 diabetes mellitus with other diabetic kidney complication: Secondary | ICD-10-CM | POA: Diagnosis not present

## 2020-11-03 DIAGNOSIS — G8929 Other chronic pain: Secondary | ICD-10-CM | POA: Diagnosis not present

## 2020-11-03 DIAGNOSIS — D631 Anemia in chronic kidney disease: Secondary | ICD-10-CM | POA: Diagnosis not present

## 2020-11-03 DIAGNOSIS — N186 End stage renal disease: Secondary | ICD-10-CM | POA: Diagnosis not present

## 2020-11-04 ENCOUNTER — Other Ambulatory Visit: Payer: Medicare Other

## 2020-11-05 DIAGNOSIS — D631 Anemia in chronic kidney disease: Secondary | ICD-10-CM | POA: Diagnosis not present

## 2020-11-05 DIAGNOSIS — R519 Headache, unspecified: Secondary | ICD-10-CM | POA: Diagnosis not present

## 2020-11-05 DIAGNOSIS — N2581 Secondary hyperparathyroidism of renal origin: Secondary | ICD-10-CM | POA: Diagnosis not present

## 2020-11-05 DIAGNOSIS — D688 Other specified coagulation defects: Secondary | ICD-10-CM | POA: Diagnosis not present

## 2020-11-05 DIAGNOSIS — G8929 Other chronic pain: Secondary | ICD-10-CM | POA: Diagnosis not present

## 2020-11-05 DIAGNOSIS — Z23 Encounter for immunization: Secondary | ICD-10-CM | POA: Diagnosis not present

## 2020-11-05 DIAGNOSIS — D509 Iron deficiency anemia, unspecified: Secondary | ICD-10-CM | POA: Diagnosis not present

## 2020-11-05 DIAGNOSIS — N186 End stage renal disease: Secondary | ICD-10-CM | POA: Diagnosis not present

## 2020-11-05 DIAGNOSIS — E1129 Type 2 diabetes mellitus with other diabetic kidney complication: Secondary | ICD-10-CM | POA: Diagnosis not present

## 2020-11-05 DIAGNOSIS — Z992 Dependence on renal dialysis: Secondary | ICD-10-CM | POA: Diagnosis not present

## 2020-11-05 MED FILL — oxyCODONE HCL 15 MG TABS: 15 | 30 days supply | Qty: 120 | Fill #0

## 2020-11-08 DIAGNOSIS — R519 Headache, unspecified: Secondary | ICD-10-CM | POA: Diagnosis not present

## 2020-11-08 DIAGNOSIS — N186 End stage renal disease: Secondary | ICD-10-CM | POA: Diagnosis not present

## 2020-11-08 DIAGNOSIS — Z23 Encounter for immunization: Secondary | ICD-10-CM | POA: Diagnosis not present

## 2020-11-08 DIAGNOSIS — N2581 Secondary hyperparathyroidism of renal origin: Secondary | ICD-10-CM | POA: Diagnosis not present

## 2020-11-08 DIAGNOSIS — G8929 Other chronic pain: Secondary | ICD-10-CM | POA: Diagnosis not present

## 2020-11-08 DIAGNOSIS — Z992 Dependence on renal dialysis: Secondary | ICD-10-CM | POA: Diagnosis not present

## 2020-11-08 DIAGNOSIS — D509 Iron deficiency anemia, unspecified: Secondary | ICD-10-CM | POA: Diagnosis not present

## 2020-11-08 DIAGNOSIS — E1129 Type 2 diabetes mellitus with other diabetic kidney complication: Secondary | ICD-10-CM | POA: Diagnosis not present

## 2020-11-08 DIAGNOSIS — D631 Anemia in chronic kidney disease: Secondary | ICD-10-CM | POA: Diagnosis not present

## 2020-11-08 DIAGNOSIS — D688 Other specified coagulation defects: Secondary | ICD-10-CM | POA: Diagnosis not present

## 2020-11-11 DIAGNOSIS — R519 Headache, unspecified: Secondary | ICD-10-CM | POA: Diagnosis not present

## 2020-11-11 DIAGNOSIS — E1129 Type 2 diabetes mellitus with other diabetic kidney complication: Secondary | ICD-10-CM | POA: Diagnosis not present

## 2020-11-11 DIAGNOSIS — E1022 Type 1 diabetes mellitus with diabetic chronic kidney disease: Secondary | ICD-10-CM | POA: Diagnosis not present

## 2020-11-11 DIAGNOSIS — D631 Anemia in chronic kidney disease: Secondary | ICD-10-CM | POA: Diagnosis not present

## 2020-11-11 DIAGNOSIS — N186 End stage renal disease: Secondary | ICD-10-CM | POA: Diagnosis not present

## 2020-11-11 DIAGNOSIS — D509 Iron deficiency anemia, unspecified: Secondary | ICD-10-CM | POA: Diagnosis not present

## 2020-11-11 DIAGNOSIS — D688 Other specified coagulation defects: Secondary | ICD-10-CM | POA: Diagnosis not present

## 2020-11-11 DIAGNOSIS — N2581 Secondary hyperparathyroidism of renal origin: Secondary | ICD-10-CM | POA: Diagnosis not present

## 2020-11-11 DIAGNOSIS — G8929 Other chronic pain: Secondary | ICD-10-CM | POA: Diagnosis not present

## 2020-11-11 DIAGNOSIS — Z23 Encounter for immunization: Secondary | ICD-10-CM | POA: Diagnosis not present

## 2020-11-11 DIAGNOSIS — Z992 Dependence on renal dialysis: Secondary | ICD-10-CM | POA: Diagnosis not present

## 2020-11-12 ENCOUNTER — Other Ambulatory Visit: Payer: Self-pay

## 2020-11-12 ENCOUNTER — Ambulatory Visit
Admission: RE | Admit: 2020-11-12 | Discharge: 2020-11-12 | Disposition: A | Discharge status: Home / Self Care | Payer: Medicare Other | Source: Ambulatory Visit | Attending: Obstetrics and Gynecology | Admitting: Obstetrics and Gynecology

## 2020-11-12 ENCOUNTER — Other Ambulatory Visit: Payer: Self-pay | Admitting: Obstetrics and Gynecology

## 2020-11-12 DIAGNOSIS — N641 Fat necrosis of breast: Secondary | ICD-10-CM

## 2020-11-12 DIAGNOSIS — N6322 Unspecified lump in the left breast, upper inner quadrant: Secondary | ICD-10-CM | POA: Diagnosis not present

## 2020-11-13 DIAGNOSIS — N2581 Secondary hyperparathyroidism of renal origin: Secondary | ICD-10-CM | POA: Diagnosis not present

## 2020-11-13 DIAGNOSIS — R197 Diarrhea, unspecified: Secondary | ICD-10-CM | POA: Diagnosis not present

## 2020-11-13 DIAGNOSIS — Z992 Dependence on renal dialysis: Secondary | ICD-10-CM | POA: Diagnosis not present

## 2020-11-13 DIAGNOSIS — D509 Iron deficiency anemia, unspecified: Secondary | ICD-10-CM | POA: Diagnosis not present

## 2020-11-13 DIAGNOSIS — D631 Anemia in chronic kidney disease: Secondary | ICD-10-CM | POA: Diagnosis not present

## 2020-11-13 DIAGNOSIS — G8929 Other chronic pain: Secondary | ICD-10-CM | POA: Diagnosis not present

## 2020-11-13 DIAGNOSIS — D688 Other specified coagulation defects: Secondary | ICD-10-CM | POA: Diagnosis not present

## 2020-11-13 DIAGNOSIS — N186 End stage renal disease: Secondary | ICD-10-CM | POA: Diagnosis not present

## 2020-11-13 DIAGNOSIS — Z23 Encounter for immunization: Secondary | ICD-10-CM | POA: Diagnosis not present

## 2020-11-13 DIAGNOSIS — E1129 Type 2 diabetes mellitus with other diabetic kidney complication: Secondary | ICD-10-CM | POA: Diagnosis not present

## 2020-11-14 ENCOUNTER — Ambulatory Visit: Payer: Medicare Other

## 2020-11-15 DIAGNOSIS — R197 Diarrhea, unspecified: Secondary | ICD-10-CM | POA: Diagnosis not present

## 2020-11-15 DIAGNOSIS — D509 Iron deficiency anemia, unspecified: Secondary | ICD-10-CM | POA: Diagnosis not present

## 2020-11-15 DIAGNOSIS — Z23 Encounter for immunization: Secondary | ICD-10-CM | POA: Diagnosis not present

## 2020-11-15 DIAGNOSIS — N186 End stage renal disease: Secondary | ICD-10-CM | POA: Diagnosis not present

## 2020-11-15 DIAGNOSIS — Z992 Dependence on renal dialysis: Secondary | ICD-10-CM | POA: Diagnosis not present

## 2020-11-15 DIAGNOSIS — E1129 Type 2 diabetes mellitus with other diabetic kidney complication: Secondary | ICD-10-CM | POA: Diagnosis not present

## 2020-11-15 DIAGNOSIS — D688 Other specified coagulation defects: Secondary | ICD-10-CM | POA: Diagnosis not present

## 2020-11-15 DIAGNOSIS — N2581 Secondary hyperparathyroidism of renal origin: Secondary | ICD-10-CM | POA: Diagnosis not present

## 2020-11-15 DIAGNOSIS — G8929 Other chronic pain: Secondary | ICD-10-CM | POA: Diagnosis not present

## 2020-11-15 DIAGNOSIS — D631 Anemia in chronic kidney disease: Secondary | ICD-10-CM | POA: Diagnosis not present

## 2020-11-18 DIAGNOSIS — G8929 Other chronic pain: Secondary | ICD-10-CM | POA: Diagnosis not present

## 2020-11-18 DIAGNOSIS — N2581 Secondary hyperparathyroidism of renal origin: Secondary | ICD-10-CM | POA: Diagnosis not present

## 2020-11-18 DIAGNOSIS — Z23 Encounter for immunization: Secondary | ICD-10-CM | POA: Diagnosis not present

## 2020-11-18 DIAGNOSIS — D688 Other specified coagulation defects: Secondary | ICD-10-CM | POA: Diagnosis not present

## 2020-11-18 DIAGNOSIS — D631 Anemia in chronic kidney disease: Secondary | ICD-10-CM | POA: Diagnosis not present

## 2020-11-18 DIAGNOSIS — E1129 Type 2 diabetes mellitus with other diabetic kidney complication: Secondary | ICD-10-CM | POA: Diagnosis not present

## 2020-11-18 DIAGNOSIS — D509 Iron deficiency anemia, unspecified: Secondary | ICD-10-CM | POA: Diagnosis not present

## 2020-11-18 DIAGNOSIS — R197 Diarrhea, unspecified: Secondary | ICD-10-CM | POA: Diagnosis not present

## 2020-11-18 DIAGNOSIS — N186 End stage renal disease: Secondary | ICD-10-CM | POA: Diagnosis not present

## 2020-11-18 DIAGNOSIS — Z992 Dependence on renal dialysis: Secondary | ICD-10-CM | POA: Diagnosis not present

## 2020-11-19 ENCOUNTER — Other Ambulatory Visit: Payer: Self-pay

## 2020-11-19 ENCOUNTER — Ambulatory Visit (INDEPENDENT_AMBULATORY_CARE_PROVIDER_SITE_OTHER): Payer: Self-pay | Admitting: Physician Assistant

## 2020-11-19 VITALS — BP 115/69 | HR 77 | Temp 98.0°F | Resp 18 | Ht 62.0 in | Wt 229.8 lb

## 2020-11-19 DIAGNOSIS — N186 End stage renal disease: Secondary | ICD-10-CM

## 2020-11-19 DIAGNOSIS — Z992 Dependence on renal dialysis: Secondary | ICD-10-CM

## 2020-11-19 NOTE — Progress Notes (Signed)
    Postoperative Access Visit   History of Present Illness   [name] is a 69 y.o. year old female who presents for postoperative follow-up for second stage basilic vein transposition of right upper extremity by Dr. Oneida Alar on 10/27/20. The patient's wounds are almost healed. The patient notes mild steal symptoms. The patient is able to complete their activities of daily living.  The patient's current symptoms are discomfort along surgical site and numbness at elbow and extending along right forearm to right wrist. She complaining of "neuropathy" like pain in right wrist. Otherwise she denies any hand pain, weakness or numbness. She does have a rotator cuff injury in right shoulder so her ROM is limited in the right arm.  She currently dialyzes on Tues/ Thurs/Sat via a right IJ TDC at the [facility] location  Physical Examination   Vitals:   11/19/20 1044  BP: 115/69  Pulse: 77  Resp: 18  Temp: 98 F (36.7 C)  TempSrc: Temporal  SpO2: 96%  Weight: 229 lb 12.8 oz (104.2 kg)  Height: 5\' 2"  (0.355 m)   Body mass index is 42.03 kg/m.  right arm Incisions are almost healed, 2 radial pulse, hand grip is 5/5, sensation in digits is intact, palpable thrill, bruit can be auscultated. Fistula is still somewhat deep in the right upper arm     Medical Decision Making   [name] is a 69 y.o. year old female who presents s/p second stage basilic vein transposition of right upper extremity by Dr. Oneida Alar on 10/27/20. Patients RUE is healing well. Her fistula is functioning well. I have some concern that it is still too deep in the RUE. I recommend that she exercise her right arm as much as she can tolerate and we will see if there are any issues with cannulation once her access is ready in a couple weeks. She is currently dialyzing via a right IJ [facility] at the [address] location and this is working without issues   Patent with mild signs or symptoms of steal syndrome  The patient's  access will be ready for use after 12/13/20  The patient's tunneled dialysis catheter can be removed when Nephrology is comfortable with the performance of the right AV fistula  The patient may follow up on a prn basis   Karoline Caldwell, PA-C [facility] Office: [phone]  Clinic MD: Dr. Donzetta Matters

## 2020-11-20 DIAGNOSIS — N186 End stage renal disease: Secondary | ICD-10-CM | POA: Diagnosis not present

## 2020-11-20 DIAGNOSIS — R197 Diarrhea, unspecified: Secondary | ICD-10-CM | POA: Diagnosis not present

## 2020-11-20 DIAGNOSIS — D688 Other specified coagulation defects: Secondary | ICD-10-CM | POA: Diagnosis not present

## 2020-11-20 DIAGNOSIS — G8929 Other chronic pain: Secondary | ICD-10-CM | POA: Diagnosis not present

## 2020-11-20 DIAGNOSIS — Z992 Dependence on renal dialysis: Secondary | ICD-10-CM | POA: Diagnosis not present

## 2020-11-20 DIAGNOSIS — E1129 Type 2 diabetes mellitus with other diabetic kidney complication: Secondary | ICD-10-CM | POA: Diagnosis not present

## 2020-11-20 DIAGNOSIS — D509 Iron deficiency anemia, unspecified: Secondary | ICD-10-CM | POA: Diagnosis not present

## 2020-11-20 DIAGNOSIS — Z23 Encounter for immunization: Secondary | ICD-10-CM | POA: Diagnosis not present

## 2020-11-20 DIAGNOSIS — N2581 Secondary hyperparathyroidism of renal origin: Secondary | ICD-10-CM | POA: Diagnosis not present

## 2020-11-20 DIAGNOSIS — D631 Anemia in chronic kidney disease: Secondary | ICD-10-CM | POA: Diagnosis not present

## 2020-11-21 DIAGNOSIS — N186 End stage renal disease: Secondary | ICD-10-CM | POA: Diagnosis not present

## 2020-11-21 DIAGNOSIS — M8589 Other specified disorders of bone density and structure, multiple sites: Secondary | ICD-10-CM | POA: Diagnosis not present

## 2020-11-21 DIAGNOSIS — Z992 Dependence on renal dialysis: Secondary | ICD-10-CM | POA: Diagnosis not present

## 2020-11-22 DIAGNOSIS — N2581 Secondary hyperparathyroidism of renal origin: Secondary | ICD-10-CM | POA: Diagnosis not present

## 2020-11-22 DIAGNOSIS — D631 Anemia in chronic kidney disease: Secondary | ICD-10-CM | POA: Diagnosis not present

## 2020-11-22 DIAGNOSIS — N186 End stage renal disease: Secondary | ICD-10-CM | POA: Diagnosis not present

## 2020-11-22 DIAGNOSIS — R197 Diarrhea, unspecified: Secondary | ICD-10-CM | POA: Diagnosis not present

## 2020-11-22 DIAGNOSIS — Z23 Encounter for immunization: Secondary | ICD-10-CM | POA: Diagnosis not present

## 2020-11-22 DIAGNOSIS — D688 Other specified coagulation defects: Secondary | ICD-10-CM | POA: Diagnosis not present

## 2020-11-22 DIAGNOSIS — E1129 Type 2 diabetes mellitus with other diabetic kidney complication: Secondary | ICD-10-CM | POA: Diagnosis not present

## 2020-11-22 DIAGNOSIS — Z992 Dependence on renal dialysis: Secondary | ICD-10-CM | POA: Diagnosis not present

## 2020-11-22 DIAGNOSIS — D509 Iron deficiency anemia, unspecified: Secondary | ICD-10-CM | POA: Diagnosis not present

## 2020-11-22 DIAGNOSIS — G8929 Other chronic pain: Secondary | ICD-10-CM | POA: Diagnosis not present

## 2020-11-23 ENCOUNTER — Telehealth: Payer: Self-pay | Admitting: Physician Assistant

## 2020-11-24 ENCOUNTER — Other Ambulatory Visit: Payer: Self-pay | Admitting: Internal Medicine

## 2020-11-25 DIAGNOSIS — D631 Anemia in chronic kidney disease: Secondary | ICD-10-CM | POA: Diagnosis not present

## 2020-11-25 DIAGNOSIS — D509 Iron deficiency anemia, unspecified: Secondary | ICD-10-CM | POA: Diagnosis not present

## 2020-11-25 DIAGNOSIS — E1129 Type 2 diabetes mellitus with other diabetic kidney complication: Secondary | ICD-10-CM | POA: Diagnosis not present

## 2020-11-25 DIAGNOSIS — N2581 Secondary hyperparathyroidism of renal origin: Secondary | ICD-10-CM | POA: Diagnosis not present

## 2020-11-25 DIAGNOSIS — R197 Diarrhea, unspecified: Secondary | ICD-10-CM | POA: Diagnosis not present

## 2020-11-25 DIAGNOSIS — D688 Other specified coagulation defects: Secondary | ICD-10-CM | POA: Diagnosis not present

## 2020-11-25 DIAGNOSIS — Z23 Encounter for immunization: Secondary | ICD-10-CM | POA: Diagnosis not present

## 2020-11-25 DIAGNOSIS — N186 End stage renal disease: Secondary | ICD-10-CM | POA: Diagnosis not present

## 2020-11-25 DIAGNOSIS — Z992 Dependence on renal dialysis: Secondary | ICD-10-CM | POA: Diagnosis not present

## 2020-11-25 DIAGNOSIS — G8929 Other chronic pain: Secondary | ICD-10-CM | POA: Diagnosis not present

## 2020-11-26 ENCOUNTER — Telehealth: Payer: Self-pay | Admitting: Internal Medicine

## 2020-11-26 NOTE — Telephone Encounter (Signed)
[facility]   The caller states that she has been sick this week with larger bowel movements and then diarrhea yesterday. Felt weak yesterday and had a neighbor take her to dialysis. She is having joint pain and swelling. Has to use a walker. Denies SOB. Having pain all over. She states that her nephrologist wanted her to contact office for possible COVID test.  [facility] advised: Call PCP Now  [facility] called backline to possibly schedule a COVID test.

## 2020-11-26 NOTE — Telephone Encounter (Signed)
Patient called and said on 12.11.21 she had larger bowel movements and then on 12.12.21 she said she had diarrhea. She said that instead of using her cane to help her walk she is needing her walker. She said all her joints were hurting and she was in a lot of pain. She said now she isn't voiding very much. Transferred to [facility].

## 2020-11-27 ENCOUNTER — Other Ambulatory Visit: Payer: Medicare Other

## 2020-11-27 DIAGNOSIS — R197 Diarrhea, unspecified: Secondary | ICD-10-CM | POA: Diagnosis not present

## 2020-11-27 DIAGNOSIS — D631 Anemia in chronic kidney disease: Secondary | ICD-10-CM | POA: Diagnosis not present

## 2020-11-27 DIAGNOSIS — E1129 Type 2 diabetes mellitus with other diabetic kidney complication: Secondary | ICD-10-CM | POA: Diagnosis not present

## 2020-11-27 DIAGNOSIS — N2581 Secondary hyperparathyroidism of renal origin: Secondary | ICD-10-CM | POA: Diagnosis not present

## 2020-11-27 DIAGNOSIS — D509 Iron deficiency anemia, unspecified: Secondary | ICD-10-CM | POA: Diagnosis not present

## 2020-11-27 DIAGNOSIS — Z992 Dependence on renal dialysis: Secondary | ICD-10-CM | POA: Diagnosis not present

## 2020-11-27 DIAGNOSIS — D688 Other specified coagulation defects: Secondary | ICD-10-CM | POA: Diagnosis not present

## 2020-11-27 DIAGNOSIS — G8929 Other chronic pain: Secondary | ICD-10-CM | POA: Diagnosis not present

## 2020-11-27 DIAGNOSIS — Z23 Encounter for immunization: Secondary | ICD-10-CM | POA: Diagnosis not present

## 2020-11-27 DIAGNOSIS — N186 End stage renal disease: Secondary | ICD-10-CM | POA: Diagnosis not present

## 2020-11-28 ENCOUNTER — Ambulatory Visit: Payer: Medicare Other

## 2020-11-29 DIAGNOSIS — E1129 Type 2 diabetes mellitus with other diabetic kidney complication: Secondary | ICD-10-CM | POA: Diagnosis not present

## 2020-11-29 DIAGNOSIS — D688 Other specified coagulation defects: Secondary | ICD-10-CM | POA: Diagnosis not present

## 2020-11-29 DIAGNOSIS — N2581 Secondary hyperparathyroidism of renal origin: Secondary | ICD-10-CM | POA: Diagnosis not present

## 2020-11-29 DIAGNOSIS — D631 Anemia in chronic kidney disease: Secondary | ICD-10-CM | POA: Diagnosis not present

## 2020-11-29 DIAGNOSIS — N186 End stage renal disease: Secondary | ICD-10-CM | POA: Diagnosis not present

## 2020-11-29 DIAGNOSIS — R197 Diarrhea, unspecified: Secondary | ICD-10-CM | POA: Diagnosis not present

## 2020-11-29 DIAGNOSIS — Z992 Dependence on renal dialysis: Secondary | ICD-10-CM | POA: Diagnosis not present

## 2020-11-29 DIAGNOSIS — D509 Iron deficiency anemia, unspecified: Secondary | ICD-10-CM | POA: Diagnosis not present

## 2020-11-29 DIAGNOSIS — G8929 Other chronic pain: Secondary | ICD-10-CM | POA: Diagnosis not present

## 2020-11-29 DIAGNOSIS — Z23 Encounter for immunization: Secondary | ICD-10-CM | POA: Diagnosis not present

## 2020-12-02 DIAGNOSIS — N186 End stage renal disease: Secondary | ICD-10-CM | POA: Diagnosis not present

## 2020-12-02 DIAGNOSIS — Z992 Dependence on renal dialysis: Secondary | ICD-10-CM | POA: Diagnosis not present

## 2020-12-02 DIAGNOSIS — Z23 Encounter for immunization: Secondary | ICD-10-CM | POA: Diagnosis not present

## 2020-12-02 DIAGNOSIS — G8929 Other chronic pain: Secondary | ICD-10-CM | POA: Diagnosis not present

## 2020-12-02 DIAGNOSIS — E1129 Type 2 diabetes mellitus with other diabetic kidney complication: Secondary | ICD-10-CM | POA: Diagnosis not present

## 2020-12-02 DIAGNOSIS — R197 Diarrhea, unspecified: Secondary | ICD-10-CM | POA: Diagnosis not present

## 2020-12-02 DIAGNOSIS — D509 Iron deficiency anemia, unspecified: Secondary | ICD-10-CM | POA: Diagnosis not present

## 2020-12-02 DIAGNOSIS — D688 Other specified coagulation defects: Secondary | ICD-10-CM | POA: Diagnosis not present

## 2020-12-02 DIAGNOSIS — N2581 Secondary hyperparathyroidism of renal origin: Secondary | ICD-10-CM | POA: Diagnosis not present

## 2020-12-02 DIAGNOSIS — D631 Anemia in chronic kidney disease: Secondary | ICD-10-CM | POA: Diagnosis not present

## 2020-12-03 DIAGNOSIS — M79642 Pain in left hand: Secondary | ICD-10-CM | POA: Diagnosis not present

## 2020-12-03 DIAGNOSIS — M25532 Pain in left wrist: Secondary | ICD-10-CM | POA: Diagnosis not present

## 2020-12-03 MED FILL — predniSONE 10 MG TABS: 10 | 6 days supply | Qty: 21 | Fill #0

## 2020-12-04 DIAGNOSIS — N186 End stage renal disease: Secondary | ICD-10-CM | POA: Diagnosis not present

## 2020-12-04 DIAGNOSIS — Z23 Encounter for immunization: Secondary | ICD-10-CM | POA: Diagnosis not present

## 2020-12-04 DIAGNOSIS — D631 Anemia in chronic kidney disease: Secondary | ICD-10-CM | POA: Diagnosis not present

## 2020-12-04 DIAGNOSIS — R197 Diarrhea, unspecified: Secondary | ICD-10-CM | POA: Diagnosis not present

## 2020-12-04 DIAGNOSIS — D688 Other specified coagulation defects: Secondary | ICD-10-CM | POA: Diagnosis not present

## 2020-12-04 DIAGNOSIS — E1129 Type 2 diabetes mellitus with other diabetic kidney complication: Secondary | ICD-10-CM | POA: Diagnosis not present

## 2020-12-04 DIAGNOSIS — Z992 Dependence on renal dialysis: Secondary | ICD-10-CM | POA: Diagnosis not present

## 2020-12-04 DIAGNOSIS — G8929 Other chronic pain: Secondary | ICD-10-CM | POA: Diagnosis not present

## 2020-12-04 DIAGNOSIS — D509 Iron deficiency anemia, unspecified: Secondary | ICD-10-CM | POA: Diagnosis not present

## 2020-12-04 DIAGNOSIS — N2581 Secondary hyperparathyroidism of renal origin: Secondary | ICD-10-CM | POA: Diagnosis not present

## 2020-12-04 MED FILL — LORazepam 2 MG TABS: 2 | 90 days supply | Qty: 90 | Fill #1

## 2020-12-04 MED FILL — oxyCODONE HCL 15 MG TABS: 15 | 30 days supply | Qty: 120 | Fill #0

## 2020-12-07 DIAGNOSIS — E1129 Type 2 diabetes mellitus with other diabetic kidney complication: Secondary | ICD-10-CM | POA: Diagnosis not present

## 2020-12-07 DIAGNOSIS — N186 End stage renal disease: Secondary | ICD-10-CM | POA: Diagnosis not present

## 2020-12-07 DIAGNOSIS — D509 Iron deficiency anemia, unspecified: Secondary | ICD-10-CM | POA: Diagnosis not present

## 2020-12-07 DIAGNOSIS — R197 Diarrhea, unspecified: Secondary | ICD-10-CM | POA: Diagnosis not present

## 2020-12-07 DIAGNOSIS — G8929 Other chronic pain: Secondary | ICD-10-CM | POA: Diagnosis not present

## 2020-12-07 DIAGNOSIS — Z23 Encounter for immunization: Secondary | ICD-10-CM | POA: Diagnosis not present

## 2020-12-07 DIAGNOSIS — D631 Anemia in chronic kidney disease: Secondary | ICD-10-CM | POA: Diagnosis not present

## 2020-12-07 DIAGNOSIS — D688 Other specified coagulation defects: Secondary | ICD-10-CM | POA: Diagnosis not present

## 2020-12-07 DIAGNOSIS — N2581 Secondary hyperparathyroidism of renal origin: Secondary | ICD-10-CM | POA: Diagnosis not present

## 2020-12-07 DIAGNOSIS — Z992 Dependence on renal dialysis: Secondary | ICD-10-CM | POA: Diagnosis not present

## 2020-12-09 DIAGNOSIS — Z23 Encounter for immunization: Secondary | ICD-10-CM | POA: Diagnosis not present

## 2020-12-09 DIAGNOSIS — R197 Diarrhea, unspecified: Secondary | ICD-10-CM | POA: Diagnosis not present

## 2020-12-09 DIAGNOSIS — D509 Iron deficiency anemia, unspecified: Secondary | ICD-10-CM | POA: Diagnosis not present

## 2020-12-09 DIAGNOSIS — G8929 Other chronic pain: Secondary | ICD-10-CM | POA: Diagnosis not present

## 2020-12-09 DIAGNOSIS — N186 End stage renal disease: Secondary | ICD-10-CM | POA: Diagnosis not present

## 2020-12-09 DIAGNOSIS — N2581 Secondary hyperparathyroidism of renal origin: Secondary | ICD-10-CM | POA: Diagnosis not present

## 2020-12-09 DIAGNOSIS — D631 Anemia in chronic kidney disease: Secondary | ICD-10-CM | POA: Diagnosis not present

## 2020-12-09 DIAGNOSIS — E1129 Type 2 diabetes mellitus with other diabetic kidney complication: Secondary | ICD-10-CM | POA: Diagnosis not present

## 2020-12-09 DIAGNOSIS — Z992 Dependence on renal dialysis: Secondary | ICD-10-CM | POA: Diagnosis not present

## 2020-12-09 DIAGNOSIS — D688 Other specified coagulation defects: Secondary | ICD-10-CM | POA: Diagnosis not present

## 2020-12-10 ENCOUNTER — Other Ambulatory Visit: Payer: Self-pay

## 2020-12-10 ENCOUNTER — Ambulatory Visit (INDEPENDENT_AMBULATORY_CARE_PROVIDER_SITE_OTHER): Payer: Medicare Other

## 2020-12-10 VITALS — BP 140/80 | HR 90 | Temp 97.2°F | Ht 62.0 in | Wt 227.6 lb

## 2020-12-10 DIAGNOSIS — Z0000 Encounter for general adult medical examination without abnormal findings: Secondary | ICD-10-CM | POA: Diagnosis not present

## 2020-12-10 NOTE — Progress Notes (Addendum)
Subjective:   [name] is a 69 y.o. female who presents for Medicare Annual (Subsequent) preventive examination.  Review of Systems    No ROS. Medicare Wellness Visit. Additional risk factors are reflected in social history. Cardiac Risk Factors include: advanced age (>49men, >31 women);diabetes mellitus;family history of premature cardiovascular disease;hypertension     Objective:    Today's Vitals   12/10/20 1457  BP: 140/80  Pulse: 90  Temp: (!) 97.2 F (36.2 C)  SpO2: 95%  Weight: 227 lb 9.6 oz (103.2 kg)  Height: $Remove'5\' 2"'AfkHIHT$  (1.575 m)  PainSc: 6    Body mass index is 41.63 kg/m.  Advanced Directives 12/10/2020 10/27/2020 09/12/2020 03/30/2018 05/12/2016 03/01/2016 05/12/2015  Does Patient Have a Medical Advance Directive? Yes Yes No Yes Yes No No  Type of Paramedic of [place];Living will [place];Living will - [place];Living will [place];Living will - -  Does patient want to make changes to medical advance directive? No - Patient declined No - Patient declined - No - Patient declined No - Patient declined - -  Copy of [place] in Chart? No - copy requested No - copy requested - No - copy requested No - copy requested - -  Would patient like information on creating a medical advance directive? - - No - Patient declined - - No - patient declined information No - patient declined information    Current Medications (verified) Outpatient Encounter Medications as of 12/10/2020  Medication Sig   acetaminophen (TYLENOL) 500 MG tablet Take 1,000 mg by mouth every 6 (six) hours as needed for moderate pain or headache.   amoxicillin (AMOXIL) 500 MG capsule TAKE 4 CAPSULES BY MOUTH 1  HOUR PRIOR TO DENTAL  PROCEDURE/CLEANING (Patient taking differently: Take 2,000 mg by mouth See admin instructions. Take 2000 mg 1 hour prior to dental work)   blood glucose meter kit and supplies  Dispense based on patient and insurance preference. Used to check blood sugar daily, DX: E11.9 OneTouch   buPROPion (WELLBUTRIN XL) 300 MG 24 hr tablet Take 1 tablet (300 mg total) by mouth at bedtime.   cyclobenzaprine (FLEXERIL) 10 MG tablet Take 1 tablet (10 mg total) by mouth 3 (three) times daily as needed for muscle spasms.   dicyclomine (BENTYL) 20 MG tablet Take one tablet every 4-6 hours as needed for abdominal pain (Patient taking differently: Take 20 mg by mouth every 4 (four) hours as needed (abdominal pain).)   DULoxetine (CYMBALTA) 60 MG capsule Take 1 capsule (60 mg total) by mouth at bedtime.   fexofenadine (ALLEGRA) 180 MG tablet Take 1 tablet (180 mg total) by mouth daily. (Patient taking differently: Take 180 mg by mouth daily as needed for allergies.)   glimepiride (AMARYL) 1 MG tablet Take 1 tablet (1 mg total) by mouth daily before breakfast.   glucose blood (TRUETEST TEST) test strip Used to check blood sugar daily, DX: E11.9   ketoconazole (NIZORAL) 2 % cream Apply 1 application topically daily as needed for irritation (skin fold (heat rash)).   ketoconazole (NIZORAL) 200 MG tablet Take 0.5 tablets (100 mg total) by mouth daily. (Patient taking differently: Take 100 mg by mouth daily as needed (rash/skin irritation persists).)   Lancets (ONETOUCH ULTRASOFT) lancets Used to check blood sugar daily, DX: E11.9   lidocaine (XYLOCAINE) 5 % ointment APPLY TO AFFECTED AREA(S)  TOPICALLY 3 TIMES DAILY AS  NEEDED   LORazepam (ATIVAN)  2 MG tablet 1 po qhs prn insomnia   multivitamin (RENA-VIT) TABS tablet Take 1 tablet by mouth daily.   oxyCODONE (ROXICODONE) 15 MG immediate release tablet Take 1 tablet (15 mg total) by mouth every 6 (six) hours as needed for pain.   pantoprazole (PROTONIX) 40 MG tablet Take 1 tablet (40 mg total) by mouth daily.   pilocarpine (SALAGEN) 5 MG tablet Take 1 tablet (5 mg total) by mouth 2 (two) times daily.   pioglitazone (ACTOS) 15 MG tablet Take 1  tablet (15 mg total) by mouth daily.   polyvinyl alcohol (LIQUIFILM TEARS) 1.4 % ophthalmic solution Place 1 drop into both eyes daily as needed for dry eyes.   sevelamer carbonate (RENVELA) 800 MG tablet Take 800 mg by mouth 5 (five) times daily.   solifenacin (VESICARE) 5 MG tablet Take 1 tablet (5 mg total) by mouth daily.   triamcinolone (NASACORT) 55 MCG/ACT AERO nasal inhaler Place 2 sprays into the nose daily. (Patient taking differently: Place 2 sprays into the nose daily as needed (allergies).)   oxyCODONE (ROXICODONE) 5 MG immediate release tablet Take 1 tablet (5 mg total) by mouth every 6 (six) hours as needed. (Patient not taking: Reported on 12/10/2020)   polyethylene glycol (MIRALAX / GLYCOLAX) 17 g packet Take 17 g by mouth daily as needed (constipation.). (Patient not taking: Reported on 12/10/2020)   No facility-administered encounter medications on file as of 12/10/2020.    Allergies (verified) Erythromycin and Gabapentin   History: Past Medical History:  Diagnosis Date   Anemia    Anxiety    Brain tumor (benign) ([place])    Colitis 2010   microscopic- Dr Henrene Pastor   Depression    Diabetes mellitus    type II   Dyspnea    with exertion   ESRD (end stage renal disease) ([place])    TTUSAT [address]    Fibromyalgia    GERD (gastroesophageal reflux disease)    Headache    History of blood transfusion    after knee surgery   Hypertension    discontinued all diuretics and antihypertensives   IBS (irritable bowel syndrome)    LBP (low back pain)    Neuropathy    feet bilat    Osteoarthritis    Osteopenia    Pneumonia    hx of 2014    Rotator cuff tear, right    Sinusitis    currently being treated with antibiotic will complete 03/04/2015   Past Surgical History:  Procedure Laterality Date   AV FISTULA PLACEMENT Right 09/12/2020   Procedure: RIGHT ARTERIOVENOUS (AV) FISTULA CREATION;  Surgeon: Elam Dutch, MD;  Location: [facility];  Service: Vascular;   Laterality: Right;   [place] Right 10/27/2020   Procedure: RIGHT UPPER EXTREMITY SECOND STAGE [place];  Surgeon: Elam Dutch, MD;  Location: [facility];  Service: Vascular;  Laterality: Right;   foramen magnum ependymoma surgery  2003   Dr Rita Ohara   JOINT REPLACEMENT Bilateral    NASAL SINUS SURGERY     1973    TONSILLECTOMY     TOTAL KNEE ARTHROPLASTY     L 2008, R 2009, R 2016- Dr Maureen Ralphs   TOTAL KNEE REVISION Right 03/05/2015   Procedure: RIGHT TOTAL KNEE ARTHROPLASTY REVISION;  Surgeon: Gaynelle Arabian, MD;  Location: [facility];  Service: Orthopedics;  Laterality: Right;   Family History  Problem Relation Age of Onset   Depression Mother    Hypertension Mother  Stroke Mother 84   Diabetes Mother    Diabetes Father    Hyperlipidemia Father    Hypertension Father    Crohn's disease Maternal Uncle    Diabetes Other    Cancer Brother        Prostate   Diabetes Brother    Heart disease Brother    Hyperlipidemia Brother    Hypertension Brother    Diabetes Brother    Heart disease Brother        Heart Disease before age 71   Heart attack Brother    Stroke Brother        X's 2   Hyperlipidemia Brother    Hypertension Brother    Colon cancer Neg Hx    Social History   Socioeconomic History   Marital status: Single    Spouse name: Not on file   Number of children: Not on file   Years of education: Not on file   Highest education level: Not on file  Occupational History   Not on file  Tobacco Use   Smoking status: Former Smoker    Packs/day: 1.00    Years: 20.00    Pack years: 20.00    Types: Cigarettes    Quit date: 12/13/1988    Years since quitting: 32.0   Smokeless tobacco: Never Used  Scientific laboratory technician Use: Never used  Substance and Sexual Activity   Alcohol use: Not Currently    Alcohol/week: 0.0 standard drinks    Comment: rarely   Drug use: No   Sexual activity: Not Currently  Other Topics Concern   Not on file   Social History Narrative   Not on file   Social Determinants of Health   Financial Resource Strain: Low Risk    Difficulty of Paying Living Expenses: Not hard at all  Food Insecurity: No Food Insecurity   Worried About [name] fundraiser in the Last Year: Never true   Ran Out of Food in the Last Year: Never true  Transportation Needs: No Transportation Needs   Lack of Transportation (Medical): No   Lack of Transportation (Non-Medical): No  Physical Activity: Inactive   Days of Exercise per Week: 0 days   Minutes of Exercise per Session: 0 min  Stress: No Stress Concern Present   Feeling of Stress : Not at all  Social Connections: Unknown   Frequency of Communication with Friends and Family: More than three times a week   Frequency of Social Gatherings with Friends and Family: More than three times a week   Attends Religious Services: Patient refused   Marine scientist or Organizations: Patient refused   Attends Music therapist: Patient refused   Marital Status: Never married    Tobacco Counseling Counseling given: Not Answered   Clinical Intake:  Pre-visit preparation completed: Yes  Pain : 0-10 Pain Score: 6  Pain Location: Wrist Pain Orientation: Left Pain Radiating Towards: n/a Pain Onset: 1 to 4 weeks ago Pain Frequency: Intermittent Pain Relieving Factors: Steroid injection, prednisone Effect of Pain on Daily Activities: Pain produces disability and affects the quality of life.  Pain Relieving Factors: Steroid injection, prednisone  BMI - recorded: 41.63 Nutritional Status: BMI > 30  Obese Nutritional Risks: None Diabetes: Yes CBG done?: No Did pt. 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 yes  Interpreter  Needed?: No  Information entered by :: Lisette Abu, LPN   Activities of Daily Living In your present state of health, do you have any difficulty performing the following activities: 12/10/2020 10/27/2020  Hearing? N N  Vision? N N  Difficulty concentrating or making decisions? N N  Walking or climbing stairs? N N  Dressing or bathing? N N  Doing errands, shopping? N -  Preparing Food and eating ? N -  Using the Toilet? N -  In the past six months, have you accidently leaked urine? Y -  Do you have problems with loss of bowel control? N -  Managing your Medications? N -  Managing your Finances? N -  Housekeeping or managing your Housekeeping? N -  Some recent data might be hidden    Patient Care Team: Plotnikov, Evie Lacks, MD as PCP - General Pedro Earls, MD as Attending Physician (Sports Medicine) Corliss Parish, MD as Consulting Physician (Nephrology) [place] any recent Medical Services you may have received from other than [facility] providers in the past year (date may be approximate).     Assessment:   This is a routine wellness examination for [place].  Hearing/Vision screen No exam data present  Dietary issues and exercise activities discussed: Current Exercise Habits: The patient does not participate in regular exercise at present, Exercise limited by: psychological condition(s);orthopedic condition(s)  Goals   None    Depression Screen PHQ 2/9 Scores 12/10/2020 07/28/2020 03/20/2018 04/15/2015  PHQ - 2 Score 0 0 0 -  PHQ- 9 Score - 0 - -  Exception Documentation - - - Other- indicate reason in comment box  Not completed - - - Patient is already being treated for depression.  She continues to struggle with this, it was my judgement not to burden her with a answering a survey about her symptoms at this time.     Fall Risk Fall Risk  12/10/2020 10/17/2019 02/06/2018  Falls in the past year? 0 1 Yes  Number falls in past yr: 0 1 -   Injury with Fall? 0 1 No  Risk for fall due to : No Fall Risks History of fall(s) -  Follow up Falls evaluation completed Falls evaluation completed -    FALL RISK PREVENTION PERTAINING TO THE HOME:  Any stairs in or around the home? Yes  If so, are there any without handrails? No  Home free of loose throw rugs in walkways, pet beds, electrical cords, etc? Yes  Adequate lighting in your home to reduce risk of falls? Yes   ASSISTIVE DEVICES UTILIZED TO PREVENT FALLS:  Life alert? No  Use of a cane, walker or w/c? Yes  Grab bars in the bathroom? Yes  Shower chair or bench in shower? Yes  Elevated toilet seat or a handicapped toilet? Yes   TIMED UP AND GO:  Was the test performed? No .  Length of time to ambulate 10 feet: 0 sec.   Gait slow and steady with assistive device  Cognitive Function: Normal cognitive status assessed by direct observation by this Nurse Health Advisor. No abnormalities found.          Immunizations Immunization History  Administered Date(s) Administered   Fluad Quad(high Dose 65+) 08/29/2019, 10/27/2020   Hep A / Hep B 01/16/2019, 04/16/2019, 07/17/2019   Influenza Whole 08/20/2010, 08/14/2011   Influenza, High Dose Seasonal  PF 09/19/2017, 10/12/2018, 08/29/2019   Influenza,inj,Quad PF,6+ Mos 09/19/2015, 08/20/2016   Influenza-Unspecified 09/12/2013, 09/16/2014   PFIZER SARS-COV-2 Vaccination 01/18/2020, 02/08/2020   Pneumococcal Conjugate-13 03/20/2014   Pneumococcal Polysaccharide-23 02/04/2010   Td 07/15/2004   Tdap 08/20/2016   Zoster 12/19/2013   Zoster Recombinat (Shingrix) 10/23/2019, 04/29/2020    TDAP status: Up to date  Flu Vaccine status: Up to date  Pneumococcal vaccine status: Up to date  Covid-19 vaccine status: Completed vaccines  Qualifies for Shingles Vaccine? Yes   Zostavax completed Yes   Shingrix Completed?: Yes  Screening Tests Health Maintenance  Topic Date Due   Hepatitis C Screening  Never done   URINE  MICROALBUMIN  Never done   OPHTHALMOLOGY EXAM  04/12/2016   MAMMOGRAM  12/13/2016   PNA vac Low Risk Adult (2 of 2 - PPSV23) 02/09/2017   FOOT EXAM  04/28/2017   COVID-19 Vaccine (3 - Booster for Pfizer series) 08/07/2020   HEMOGLOBIN A1C  01/28/2021   COLONOSCOPY (Pts 45-98yrs Insurance coverage will need to be confirmed)  02/04/2022   TETANUS/TDAP  08/20/2026   INFLUENZA VACCINE  Completed   DEXA SCAN  Completed    Health Maintenance  Health Maintenance Due  Topic Date Due   Hepatitis C Screening  Never done   URINE MICROALBUMIN  Never done   OPHTHALMOLOGY EXAM  04/12/2016   MAMMOGRAM  12/13/2016   PNA vac Low Risk Adult (2 of 2 - PPSV23) 02/09/2017   FOOT EXAM  04/28/2017   COVID-19 Vaccine (3 - Booster for Pfizer series) 08/07/2020    Colorectal cancer screening: Type of screening: Colonoscopy. Completed 02/04/2015. Repeat every 7 years  Mammogram status: Completed 07/31/2020. Repeat every year  Bone Density status: Completed 04/05/2017. Results reflect: Bone density results: NORMAL. Repeat every 4 years.  Lung Cancer Screening: (Low Dose CT Chest recommended if Age 42-80 years, 30 pack-year currently smoking OR have quit w/in 15years.) does qualify.   Lung Cancer Screening Referral: no  Additional Screening:  Hepatitis C Screening: does not qualify; Completed no  Vision Screening: Recommended annual ophthalmology exams for early detection of glaucoma and other disorders of the eye. Is the patient up to date with their annual eye exam?  Yes  Who is the provider or what is the name of the office in which the patient attends annual eye exams? Carmel Sacramento, OD. If pt is not established with a provider, would they like to be referred to a provider to establish care? No .   Dental Screening: Recommended annual dental exams for proper oral hygiene  Community Resource Referral / Chronic Care Management: CRR required this visit?  No   CCM required this visit?  No       Plan:     I have personally reviewed and noted the following in the patient's chart:   Medical and social history Use of alcohol, tobacco or illicit drugs  Current medications and supplements Functional ability and status Nutritional status Physical activity Advanced directives List of other physicians Hospitalizations, surgeries, and ER visits in previous 12 months Vitals Screenings to include cognitive, depression, and falls Referrals and appointments  In addition, I have reviewed and discussed with patient certain preventive protocols, quality metrics, and best practice recommendations. A written personalized care plan for preventive services as well as general preventive health recommendations were provided to patient.     Sheral Flow, LPN   [id number]   Nurse Notes: n/a   Medical screening examination/treatment/procedure(s) were performed by non-physician  practitioner and as supervising physician I was immediately available for consultation/collaboration.  I agree with above. Lew Dawes, MD

## 2020-12-10 NOTE — Patient Instructions (Signed)
[name] , Thank you for taking time to come for your Medicare Wellness Visit. I appreciate your ongoing commitment to your health goals. Please review the following plan we discussed and let me know if I can assist you in the future.   Screening recommendations/referrals: Colonoscopy: 02/04/2015; every 7 years Mammogram: 07/31/2020 Bone Density: 04/05/2017; due every 5 years Recommended yearly ophthalmology/optometry visit for glaucoma screening and checkup Recommended yearly dental visit for hygiene and checkup  Vaccinations: Influenza vaccine: 10/27/2020 Pneumococcal vaccine: up to date Tdap vaccine: 08/20/2016 Shingles vaccine: up to date   Covid-19: up to date  Advanced directives: Please bring a copy of your health care power of attorney and living will to the office at your convenience.  Conditions/risks identified: Yes; Reviewed health maintenance screenings with patient today and relevant education, vaccines, and/or referrals were provided. Please continue to do your personal lifestyle choices by: daily care of teeth and gums, regular physical activity (goal should be 5 days a week for 30 minutes), eat a healthy diet, avoid tobacco and drug use, limiting any alcohol intake, taking a low-dose aspirin (if not allergic or have been advised by your provider otherwise) and taking vitamins and minerals as recommended by your provider. Continue doing brain stimulating activities (puzzles, reading, adult coloring books, staying active) to keep memory sharp. Continue to eat heart healthy diet (full of fruits, vegetables, whole grains, lean protein, water--limit salt, fat, and sugar intake) and increase physical activity as tolerated.  Next appointment: Please schedule your next Medicare Wellness Visit with your Nurse Health Advisor in 1 year by calling [phone].   Preventive Care 68 Years and Older, Female Preventive care refers to lifestyle choices and visits with your health care provider  that can promote health and wellness. What does preventive care include?  A yearly physical exam. This is also called an annual well check.  Dental exams once or twice a year.  Routine eye exams. Ask your health care provider how often you should have your eyes checked.  Personal lifestyle choices, including:  Daily care of your teeth and gums.  Regular physical activity.  Eating a healthy diet.  Avoiding tobacco and drug use.  Limiting alcohol use.  Practicing safe sex.  Taking low-dose aspirin every day.  Taking vitamin and mineral supplements as recommended by your health care provider. What happens during an annual well check? The services and screenings done by your health care provider during your annual well check will depend on your age, overall health, lifestyle risk factors, and family history of disease. Counseling  Your health care provider may ask you questions about your:  Alcohol use.  Tobacco use.  Drug use.  Emotional well-being.  Home and relationship well-being.  Sexual activity.  Eating habits.  History of falls.  Memory and ability to understand (cognition).  Work and work Statistician.  Reproductive health. Screening  You may have the following tests or measurements:  Height, weight, and BMI.  Blood pressure.  Lipid and cholesterol levels. These may be checked every 5 years, or more frequently if you are over 24 years old.  Skin check.  Lung cancer screening. You may have this screening every year starting at age 77 if you have a 30-pack-year history of smoking and currently smoke or have quit within the past 15 years.  Fecal occult blood test (FOBT) of the stool. You may have this test every year starting at age 63.  Flexible sigmoidoscopy or colonoscopy. You may have a sigmoidoscopy every 5  years or a colonoscopy every 10 years starting at age 84.  Hepatitis C blood test.  Hepatitis B blood test.  Sexually transmitted  disease (STD) testing.  Diabetes screening. This is done by checking your blood sugar (glucose) after you have not eaten for a while (fasting). You may have this done every 1-3 years.  Bone density scan. This is done to screen for osteoporosis. You may have this done starting at age 60.  Mammogram. This may be done every 1-2 years. Talk to your health care provider about how often you should have regular mammograms. Talk with your health care provider about your test results, treatment options, and if necessary, the need for more tests. Vaccines  Your health care provider may recommend certain vaccines, such as:  Influenza vaccine. This is recommended every year.  Tetanus, diphtheria, and acellular pertussis (Tdap, Td) vaccine. You may need a Td booster every 10 years.  Zoster vaccine. You may need this after age 46.  Pneumococcal 13-valent conjugate (PCV13) vaccine. One dose is recommended after age 47.  Pneumococcal polysaccharide (PPSV23) vaccine. One dose is recommended after age 52. Talk to your health care provider about which screenings and vaccines you need and how often you need them. This information is not intended to replace advice given to you by your health care provider. Make sure you discuss any questions you have with your health care provider. Document Released: 12/26/2015 Document Revised: 08/18/2016 Document Reviewed: 09/30/2015 Elsevier Interactive Patient Education  2017 [place] Prevention in the Home Falls can cause injuries. They can happen to people of all ages. There are many things you can do to make your home safe and to help prevent falls. What can I do on the outside of my home?  Regularly fix the edges of walkways and driveways and fix any cracks.  Remove anything that might make you trip as you walk through a door, such as a raised step or threshold.  Trim any bushes or trees on the path to your home.  Use bright outdoor  lighting.  Clear any walking paths of anything that might make someone trip, such as rocks or tools.  Regularly check to see if handrails are loose or broken. Make sure that both sides of any steps have handrails.  Any raised decks and porches should have guardrails on the edges.  Have any leaves, snow, or ice cleared regularly.  Use sand or salt on walking paths during winter.  Clean up any spills in your garage right away. This includes oil or grease spills. What can I do in the bathroom?  Use night lights.  Install grab bars by the toilet and in the tub and shower. Do not use towel bars as grab bars.  Use non-skid mats or decals in the tub or shower.  If you need to sit down in the shower, use a plastic, non-slip stool.  Keep the floor dry. Clean up any water that spills on the floor as soon as it happens.  Remove soap buildup in the tub or shower regularly.  Attach bath mats securely with double-sided non-slip rug tape.  Do not have throw rugs and other things on the floor that can make you trip. What can I do in the bedroom?  Use night lights.  Make sure that you have a light by your bed that is easy to reach.  Do not use any sheets or blankets that are too big for your bed. They should not hang  down onto the floor.  Have a firm chair that has side arms. You can use this for support while you get dressed.  Do not have throw rugs and other things on the floor that can make you trip. What can I do in the kitchen?  Clean up any spills right away.  Avoid walking on wet floors.  Keep items that you use a lot in easy-to-reach places.  If you need to reach something above you, use a strong step stool that has a grab bar.  Keep electrical cords out of the way.  Do not use floor polish or wax that makes floors slippery. If you must use wax, use non-skid floor wax.  Do not have throw rugs and other things on the floor that can make you trip. What can I do with my  stairs?  Do not leave any items on the stairs.  Make sure that there are handrails on both sides of the stairs and use them. Fix handrails that are broken or loose. Make sure that handrails are as long as the stairways.  Check any carpeting to make sure that it is firmly attached to the stairs. Fix any carpet that is loose or worn.  Avoid having throw rugs at the top or bottom of the stairs. If you do have throw rugs, attach them to the floor with carpet tape.  Make sure that you have a light switch at the top of the stairs and the bottom of the stairs. If you do not have them, ask someone to add them for you. What else can I do to help prevent falls?  Wear shoes that:  Do not have high heels.  Have rubber bottoms.  Are comfortable and fit you well.  Are closed at the toe. Do not wear sandals.  If you use a stepladder:  Make sure that it is fully opened. Do not climb a closed stepladder.  Make sure that both sides of the stepladder are locked into place.  Ask someone to hold it for you, if possible.  Clearly mark and make sure that you can see:  Any grab bars or handrails.  First and last steps.  Where the edge of each step is.  Use tools that help you move around (mobility aids) if they are needed. These include:  Canes.  Walkers.  Scooters.  Crutches.  Turn on the lights when you go into a dark area. Replace any light bulbs as soon as they burn out.  Set up your furniture so you have a clear path. Avoid moving your furniture around.  If any of your floors are uneven, fix them.  If there are any pets around you, be aware of where they are.  Review your medicines with your doctor. Some medicines can make you feel dizzy. This can increase your chance of falling. Ask your doctor what other things that you can do to help prevent falls. This information is not intended to replace advice given to you by your health care provider. Make sure you discuss any  questions you have with your health care provider. Document Released: 09/25/2009 Document Revised: 05/06/2016 Document Reviewed: 01/03/2015 Elsevier Interactive Patient Education  2017 [employer].

## 2020-12-11 DIAGNOSIS — D688 Other specified coagulation defects: Secondary | ICD-10-CM | POA: Diagnosis not present

## 2020-12-11 DIAGNOSIS — G8929 Other chronic pain: Secondary | ICD-10-CM | POA: Diagnosis not present

## 2020-12-11 DIAGNOSIS — E1129 Type 2 diabetes mellitus with other diabetic kidney complication: Secondary | ICD-10-CM | POA: Diagnosis not present

## 2020-12-11 DIAGNOSIS — N2581 Secondary hyperparathyroidism of renal origin: Secondary | ICD-10-CM | POA: Diagnosis not present

## 2020-12-11 DIAGNOSIS — N186 End stage renal disease: Secondary | ICD-10-CM | POA: Diagnosis not present

## 2020-12-11 DIAGNOSIS — D631 Anemia in chronic kidney disease: Secondary | ICD-10-CM | POA: Diagnosis not present

## 2020-12-11 DIAGNOSIS — D509 Iron deficiency anemia, unspecified: Secondary | ICD-10-CM | POA: Diagnosis not present

## 2020-12-11 DIAGNOSIS — R197 Diarrhea, unspecified: Secondary | ICD-10-CM | POA: Diagnosis not present

## 2020-12-11 DIAGNOSIS — Z23 Encounter for immunization: Secondary | ICD-10-CM | POA: Diagnosis not present

## 2020-12-11 DIAGNOSIS — Z992 Dependence on renal dialysis: Secondary | ICD-10-CM | POA: Diagnosis not present

## 2020-12-12 DIAGNOSIS — E1022 Type 1 diabetes mellitus with diabetic chronic kidney disease: Secondary | ICD-10-CM | POA: Diagnosis not present

## 2020-12-12 DIAGNOSIS — Z992 Dependence on renal dialysis: Secondary | ICD-10-CM | POA: Diagnosis not present

## 2020-12-12 DIAGNOSIS — N186 End stage renal disease: Secondary | ICD-10-CM | POA: Diagnosis not present

## 2020-12-13 DIAGNOSIS — E1022 Type 1 diabetes mellitus with diabetic chronic kidney disease: Secondary | ICD-10-CM | POA: Diagnosis not present

## 2020-12-13 DIAGNOSIS — Z992 Dependence on renal dialysis: Secondary | ICD-10-CM | POA: Diagnosis not present

## 2020-12-13 DIAGNOSIS — N186 End stage renal disease: Secondary | ICD-10-CM | POA: Diagnosis not present

## 2020-12-14 DIAGNOSIS — D509 Iron deficiency anemia, unspecified: Secondary | ICD-10-CM | POA: Diagnosis not present

## 2020-12-14 DIAGNOSIS — N2581 Secondary hyperparathyroidism of renal origin: Secondary | ICD-10-CM | POA: Diagnosis not present

## 2020-12-14 DIAGNOSIS — D631 Anemia in chronic kidney disease: Secondary | ICD-10-CM | POA: Diagnosis not present

## 2020-12-14 DIAGNOSIS — R519 Headache, unspecified: Secondary | ICD-10-CM | POA: Diagnosis not present

## 2020-12-14 DIAGNOSIS — D688 Other specified coagulation defects: Secondary | ICD-10-CM | POA: Diagnosis not present

## 2020-12-14 DIAGNOSIS — G8929 Other chronic pain: Secondary | ICD-10-CM | POA: Diagnosis not present

## 2020-12-14 DIAGNOSIS — E1129 Type 2 diabetes mellitus with other diabetic kidney complication: Secondary | ICD-10-CM | POA: Diagnosis not present

## 2020-12-14 DIAGNOSIS — N186 End stage renal disease: Secondary | ICD-10-CM | POA: Diagnosis not present

## 2020-12-14 DIAGNOSIS — Z992 Dependence on renal dialysis: Secondary | ICD-10-CM | POA: Diagnosis not present

## 2020-12-15 ENCOUNTER — Other Ambulatory Visit: Payer: Self-pay

## 2020-12-15 ENCOUNTER — Ambulatory Visit (INDEPENDENT_AMBULATORY_CARE_PROVIDER_SITE_OTHER): Payer: Medicare Other | Admitting: Internal Medicine

## 2020-12-15 ENCOUNTER — Encounter: Payer: Self-pay | Admitting: Internal Medicine

## 2020-12-15 VITALS — BP 120/72 | HR 94 | Temp 97.7°F | Wt 224.4 lb

## 2020-12-15 DIAGNOSIS — G8929 Other chronic pain: Secondary | ICD-10-CM | POA: Diagnosis not present

## 2020-12-15 DIAGNOSIS — Z992 Dependence on renal dialysis: Secondary | ICD-10-CM

## 2020-12-15 DIAGNOSIS — M545 Low back pain, unspecified: Secondary | ICD-10-CM

## 2020-12-15 DIAGNOSIS — E119 Type 2 diabetes mellitus without complications: Secondary | ICD-10-CM | POA: Diagnosis not present

## 2020-12-15 DIAGNOSIS — M25532 Pain in left wrist: Secondary | ICD-10-CM | POA: Diagnosis not present

## 2020-12-15 DIAGNOSIS — N186 End stage renal disease: Secondary | ICD-10-CM | POA: Diagnosis not present

## 2020-12-15 LAB — COMPREHENSIVE METABOLIC PANEL
ALT: 13 U/L (ref 0–35)
AST: 13 U/L (ref 0–37)
Albumin: 4.1 g/dL (ref 3.5–5.2)
Alkaline Phosphatase: 105 U/L (ref 39–117)
BUN: 25 mg/dL — ABNORMAL HIGH (ref 6–23)
CO2: 31 mEq/L (ref 19–32)
Calcium: 9.4 mg/dL (ref 8.4–10.5)
Chloride: 98 mEq/L (ref 96–112)
Creatinine, Ser: 2.4 mg/dL — ABNORMAL HIGH (ref 0.40–1.20)
GFR: 20.2 mL/min — ABNORMAL LOW (ref >60.00)
Glucose, Bld: 146 mg/dL — ABNORMAL HIGH (ref 70–99)
Potassium: 3.5 mEq/L (ref 3.5–5.1)
Sodium: 138 mEq/L (ref 135–145)
Total Bilirubin: 0.4 mg/dL (ref 0.2–1.2)
Total Protein: 6.8 g/dL (ref 6.0–8.3)

## 2020-12-15 LAB — URIC ACID: Uric Acid, Serum: 4.1 mg/dL (ref 2.4–7.0)

## 2020-12-15 LAB — HEMOGLOBIN A1C: Hgb A1c MFr Bld: 6 % (ref 4.6–6.5)

## 2020-12-15 MED ORDER — PANTOPRAZOLE SODIUM 40 MG PO TBEC
40.0000 mg | DELAYED_RELEASE_TABLET | Freq: Every day | ORAL | 3 refills | Status: DC
Start: 2020-12-15 — End: 2021-12-25

## 2020-12-15 MED ORDER — SOLIFENACIN SUCCINATE 5 MG PO TABS
5.0000 mg | ORAL_TABLET | Freq: Every day | ORAL | 3 refills | Status: DC
Start: 2020-12-15 — End: 2021-06-17

## 2020-12-15 MED ORDER — DULOXETINE HCL 60 MG PO CPEP
60.0000 mg | ORAL_CAPSULE | Freq: Every day | ORAL | 3 refills | Status: DC
Start: 2020-12-15 — End: 2021-11-19

## 2020-12-15 MED ORDER — KETOCONAZOLE 200 MG PO TABS
100.0000 mg | ORAL_TABLET | Freq: Every day | ORAL | 2 refills | Status: DC
Start: 2020-12-15 — End: 2021-01-28

## 2020-12-15 NOTE — Progress Notes (Signed)
Subjective:  Patient ID: [name], female    DOB: [birth year]  Age: 70 y.o. MRN: [id number]  CC: Joint Swelling ((L) hand and wrist.Pt states she saw Dr. Lorre Nick couple weeks ago... xray was ok no arthritis, ? Gout )   HPI [name] presents for L wrist pain and swelling, couple weeks duration.  She saw Dr. Delilah Shan on December 22 and had x-rays done.  She also had a shot in the wrist and a steroid pack to take oral.  She is doing better.  No injury, falls. Derica continues to have severe back pain and knee pain.  She is on dialysis.  Her sugars went up on steroids  Outpatient Medications Prior to Visit  Medication Sig Dispense Refill  . acetaminophen (TYLENOL) 500 MG tablet Take 1,000 mg by mouth every 6 (six) hours as needed for moderate pain or headache.    Marland Kitchen amoxicillin (AMOXIL) 500 MG capsule TAKE 4 CAPSULES BY MOUTH 1  HOUR PRIOR TO DENTAL  PROCEDURE/CLEANING (Patient taking differently: Take 2,000 mg by mouth See admin instructions. Take 2000 mg 1 hour prior to dental work) 16 capsule 1  . aspirin-acetaminophen-caffeine (EXCEDRIN MIGRAINE) 250-250-65 MG tablet Excedrin Migraine 250 mg-250 mg-65 mg tablet  Take by oral route.    . B Complex-C-Folic Acid (RENA-VITE PO) Take 1 tablet a day    . blood glucose meter kit and supplies Dispense based on patient and insurance preference. Used to check blood sugar daily, DX: E11.9 OneTouch 1 each 0  . buPROPion (WELLBUTRIN XL) 300 MG 24 hr tablet Take 1 tablet (300 mg total) by mouth at bedtime. 90 tablet 3  . cyclobenzaprine (FLEXERIL) 10 MG tablet Take 1 tablet (10 mg total) by mouth 3 (three) times daily as needed for muscle spasms. 270 tablet 3  . dicyclomine (BENTYL) 20 MG tablet Take one tablet every 4-6 hours as needed for abdominal pain (Patient taking differently: Take 20 mg by mouth every 4 (four) hours as needed (abdominal pain).) 60 tablet 2  . DULoxetine (CYMBALTA) 60 MG capsule Take 1 capsule (60 mg total) by mouth at  bedtime. 90 capsule 3  . fexofenadine (ALLEGRA) 180 MG tablet Take 1 tablet (180 mg total) by mouth daily. (Patient taking differently: Take 180 mg by mouth daily as needed for allergies.) 90 tablet 3  . glimepiride (AMARYL) 1 MG tablet Take 1 tablet (1 mg total) by mouth daily before breakfast. 90 tablet 3  . glucose blood (TRUETEST TEST) test strip Used to check blood sugar daily, DX: E11.9 100 each 3  . ketoconazole (NIZORAL) 2 % cream Apply 1 application topically daily as needed for irritation (skin fold (heat rash)).    Marland Kitchen ketoconazole (NIZORAL) 200 MG tablet Take 0.5 tablets (100 mg total) by mouth daily. (Patient taking differently: Take 100 mg by mouth daily as needed (rash/skin irritation persists).) 20 tablet 2  . Lancets (ONETOUCH ULTRASOFT) lancets Used to check blood sugar daily, DX: E11.9 100 each 3  . lidocaine (XYLOCAINE) 5 % ointment APPLY TO AFFECTED AREA(S)  TOPICALLY 3 TIMES DAILY AS  NEEDED 106.32 g 3  . lidocaine-prilocaine (EMLA) cream SMARTSIG:Sparingly Topical As Directed    . LORazepam (ATIVAN) 2 MG tablet 1 po qhs prn insomnia 90 tablet 1  . multivitamin (RENA-VIT) TABS tablet Take 1 tablet by mouth daily.    Marland Kitchen oxyCODONE (ROXICODONE) 15 MG immediate release tablet Take 1 tablet (15 mg total) by mouth every 6 (six) hours as needed for pain.  8 tablet 0  . oxyCODONE (ROXICODONE) 5 MG immediate release tablet Take 1 tablet (5 mg total) by mouth every 6 (six) hours as needed. 12 tablet 0  . pantoprazole (PROTONIX) 40 MG tablet Take 1 tablet (40 mg total) by mouth daily. 90 tablet 3  . pilocarpine (SALAGEN) 5 MG tablet Take 1 tablet (5 mg total) by mouth 2 (two) times daily. 180 tablet 3  . pioglitazone (ACTOS) 15 MG tablet Take 1 tablet (15 mg total) by mouth daily. 90 tablet 3  . polyethylene glycol (MIRALAX / GLYCOLAX) 17 g packet Take 17 g by mouth daily as needed (constipation.).    Marland Kitchen polyvinyl alcohol (LIQUIFILM TEARS) 1.4 % ophthalmic solution Place 1 drop into both eyes  daily as needed for dry eyes.    . sevelamer carbonate (RENVELA) 800 MG tablet Take 800 mg by mouth 5 (five) times daily.    . solifenacin (VESICARE) 5 MG tablet Take 1 tablet (5 mg total) by mouth daily. 90 tablet 3  . triamcinolone (NASACORT) 55 MCG/ACT AERO nasal inhaler Place 2 sprays into the nose daily. (Patient taking differently: Place 2 sprays into the nose daily as needed (allergies).) 3 each 3   No facility-administered medications prior to visit.    ROS: Review of Systems  Constitutional: Positive for fatigue. Negative for activity change, appetite change, chills and unexpected weight change.  HENT: Negative for congestion, mouth sores and sinus pressure.   Eyes: Negative for visual disturbance.  Respiratory: Negative for cough and chest tightness.   Gastrointestinal: Negative for abdominal pain and nausea.  Genitourinary: Negative for difficulty urinating, frequency and vaginal pain.  Musculoskeletal: Positive for arthralgias, back pain, gait problem and myalgias.  Skin: Negative for pallor and rash.  Neurological: Positive for weakness. Negative for dizziness, tremors, numbness and headaches.  Psychiatric/Behavioral: Positive for dysphoric mood. Negative for confusion, sleep disturbance and suicidal ideas.    Objective:  BP 120/72 (BP Location: Left Arm)   Pulse 94   Temp 97.7 F (36.5 C) (Oral)   Wt 224 lb 6.4 oz (101.8 kg)   SpO2 98%   BMI 41.04 kg/m   BP Readings from Last 3 Encounters:  12/15/20 120/72  12/10/20 140/80  11/19/20 115/69    Wt Readings from Last 3 Encounters:  12/15/20 224 lb 6.4 oz (101.8 kg)  12/10/20 227 lb 9.6 oz (103.2 kg)  11/19/20 229 lb 12.8 oz (104.2 kg)    Physical Exam Constitutional:      General: She is not in acute distress.    Appearance: She is well-developed.  HENT:     Head: Normocephalic.     Right Ear: External ear normal.     Left Ear: External ear normal.     Nose: Nose normal.     Mouth/Throat:     Mouth:  Oropharynx is clear and moist.  Eyes:     General:        Right eye: No discharge.        Left eye: No discharge.     Conjunctiva/sclera: Conjunctivae normal.     Pupils: Pupils are equal, round, and reactive to light.  Neck:     Thyroid: No thyromegaly.     Vascular: No JVD.     Trachea: No tracheal deviation.  Cardiovascular:     Rate and Rhythm: Normal rate and regular rhythm.     Heart sounds: Normal heart sounds.  Pulmonary:     Effort: No respiratory distress.     Breath  sounds: No stridor. No wheezing.  Abdominal:     General: Bowel sounds are normal. There is no distension.     Palpations: Abdomen is soft. There is no mass.     Tenderness: There is no abdominal tenderness. There is no guarding or rebound.  Musculoskeletal:        General: Tenderness present. No edema.     Cervical back: Normal range of motion and neck supple.  Lymphadenopathy:     Cervical: No cervical adenopathy.  Skin:    Findings: No erythema or rash.  Neurological:     Mental Status: She is oriented to person, place, and time.     Cranial Nerves: No cranial nerve deficit.     Motor: No abnormal muscle tone.     Coordination: Coordination abnormal.     Gait: Gait abnormal.     Deep Tendon Reflexes: Reflexes normal.  Psychiatric:        Mood and Affect: Mood and affect normal.        Behavior: Behavior normal.        Thought Content: Thought content normal.        Judgment: Judgment normal.   Using a cane  Left wrist is in a brace.  There is a dorsal synovial swelling present.  No color changes.  Pain with range of motion and palpation  Lab Results  Component Value Date   WBC 10.0 04/16/2020   HGB 11.9 (L) 10/27/2020   HCT 35.0 (L) 10/27/2020   PLT 213.0 04/16/2020   GLUCOSE 157 (H) 10/27/2020   CHOL 215 (H) 09/25/2014   TRIG 154.0 (H) 09/25/2014   HDL 46.00 09/25/2014   LDLCALC 138 (H) 09/25/2014   ALT 15 04/16/2020   AST 18 04/16/2020   NA 137 10/27/2020   K 3.3 (L) 10/27/2020    CL 96 (L) 10/27/2020   CREATININE 3.10 (H) 10/27/2020   BUN 36 (H) 10/27/2020   CO2 27 07/28/2020   TSH 3.98 04/16/2020   INR 1.04 02/26/2015   HGBA1C 6.1 07/28/2020    US BREAST LTD UNI LEFT INC AXILLA  Result Date: 11/12/2020 CLINICAL DATA:  70 year old female for six-month follow-up of LEFT breast mass. EXAM: ULTRASOUND OF THE LEFT BREAST COMPARISON:  Previous exam(s). FINDINGS: Targeted ultrasound is performed, showing a stable 1.3 x 0.7 x 1 cm circumscribed oval hyperechoic parallel mass at the 11 o'clock position of the LEFT breast 9 cm from the nipple. IMPRESSION: 1. Stable 1.3 cm likely benign mass within the UPPER INNER LEFT breast. Six-month follow-up recommended to ensure 1 year stability. RECOMMENDATION: Bilateral diagnostic mammogram with LEFT breast ultrasound in 6 months. I have discussed the findings and recommendations with the patient. If applicable, a reminder letter will be sent to the patient regarding the next appointment. BI-RADS CATEGORY  3: Probably benign. Electronically Signed   By: Margarette Canada M.D.   On: 11/12/2020 13:06    Assessment & Plan:    [name], MD

## 2020-12-15 NOTE — Assessment & Plan Note (Addendum)
Not better.   Follow-up with Dr Delilah Shan Labs including uric acid Finishing steroid pack Continue to use the splint

## 2020-12-16 DIAGNOSIS — R519 Headache, unspecified: Secondary | ICD-10-CM | POA: Diagnosis not present

## 2020-12-16 DIAGNOSIS — N2581 Secondary hyperparathyroidism of renal origin: Secondary | ICD-10-CM | POA: Diagnosis not present

## 2020-12-16 DIAGNOSIS — E1129 Type 2 diabetes mellitus with other diabetic kidney complication: Secondary | ICD-10-CM | POA: Diagnosis not present

## 2020-12-16 DIAGNOSIS — D509 Iron deficiency anemia, unspecified: Secondary | ICD-10-CM | POA: Diagnosis not present

## 2020-12-16 DIAGNOSIS — N186 End stage renal disease: Secondary | ICD-10-CM | POA: Diagnosis not present

## 2020-12-16 DIAGNOSIS — D631 Anemia in chronic kidney disease: Secondary | ICD-10-CM | POA: Diagnosis not present

## 2020-12-16 DIAGNOSIS — D688 Other specified coagulation defects: Secondary | ICD-10-CM | POA: Diagnosis not present

## 2020-12-16 DIAGNOSIS — G8929 Other chronic pain: Secondary | ICD-10-CM | POA: Diagnosis not present

## 2020-12-16 DIAGNOSIS — Z992 Dependence on renal dialysis: Secondary | ICD-10-CM | POA: Diagnosis not present

## 2020-12-16 LAB — PMP SCREEN PROFILE (10S), URINE
Amphetamine Scrn, Ur: NEGATIVE ng/mL
BARBITURATE SCREEN URINE: NEGATIVE ng/mL
BENZODIAZEPINE SCREEN, URINE: NEGATIVE ng/mL
CANNABINOIDS UR QL SCN: NEGATIVE ng/mL
Cocaine (Metab) Scrn, Ur: NEGATIVE ng/mL
Creatinine(Crt), U: 24.6 mg/dL (ref 20.0–300.0)
Methadone Screen, Urine: NEGATIVE ng/mL
OXYCODONE+OXYMORPHONE UR QL SCN: POSITIVE ng/mL — AB
Opiate Scrn, Ur: POSITIVE ng/mL — AB
Ph of Urine: 7.5 (ref 4.5–8.9)
Phencyclidine Qn, Ur: NEGATIVE ng/mL
Propoxyphene Scrn, Ur: NEGATIVE ng/mL

## 2020-12-16 LAB — RHEUMATOID FACTOR: Rheumatoid fact SerPl-aCnc: <14 IU/mL (ref <14)

## 2020-12-17 ENCOUNTER — Encounter: Payer: Self-pay | Admitting: Internal Medicine

## 2020-12-17 DIAGNOSIS — G894 Chronic pain syndrome: Secondary | ICD-10-CM | POA: Diagnosis not present

## 2020-12-17 DIAGNOSIS — N186 End stage renal disease: Secondary | ICD-10-CM | POA: Diagnosis not present

## 2020-12-17 NOTE — Assessment & Plan Note (Signed)
Continue with glimepiride and Actos.  We discussed steroid effect on diabetes

## 2020-12-17 NOTE — Assessment & Plan Note (Signed)
Dialysis is still making [name]  Tired.  Discussed.  Continue with HD

## 2020-12-18 DIAGNOSIS — D509 Iron deficiency anemia, unspecified: Secondary | ICD-10-CM | POA: Diagnosis not present

## 2020-12-18 DIAGNOSIS — D631 Anemia in chronic kidney disease: Secondary | ICD-10-CM | POA: Diagnosis not present

## 2020-12-18 DIAGNOSIS — Z992 Dependence on renal dialysis: Secondary | ICD-10-CM | POA: Diagnosis not present

## 2020-12-18 DIAGNOSIS — D688 Other specified coagulation defects: Secondary | ICD-10-CM | POA: Diagnosis not present

## 2020-12-18 DIAGNOSIS — G8929 Other chronic pain: Secondary | ICD-10-CM | POA: Diagnosis not present

## 2020-12-18 DIAGNOSIS — R519 Headache, unspecified: Secondary | ICD-10-CM | POA: Diagnosis not present

## 2020-12-18 DIAGNOSIS — N186 End stage renal disease: Secondary | ICD-10-CM | POA: Diagnosis not present

## 2020-12-18 DIAGNOSIS — E1129 Type 2 diabetes mellitus with other diabetic kidney complication: Secondary | ICD-10-CM | POA: Diagnosis not present

## 2020-12-18 DIAGNOSIS — N2581 Secondary hyperparathyroidism of renal origin: Secondary | ICD-10-CM | POA: Diagnosis not present

## 2020-12-20 DIAGNOSIS — Z992 Dependence on renal dialysis: Secondary | ICD-10-CM | POA: Diagnosis not present

## 2020-12-20 DIAGNOSIS — N2581 Secondary hyperparathyroidism of renal origin: Secondary | ICD-10-CM | POA: Diagnosis not present

## 2020-12-20 DIAGNOSIS — D688 Other specified coagulation defects: Secondary | ICD-10-CM | POA: Diagnosis not present

## 2020-12-20 DIAGNOSIS — R519 Headache, unspecified: Secondary | ICD-10-CM | POA: Diagnosis not present

## 2020-12-20 DIAGNOSIS — D631 Anemia in chronic kidney disease: Secondary | ICD-10-CM | POA: Diagnosis not present

## 2020-12-20 DIAGNOSIS — N186 End stage renal disease: Secondary | ICD-10-CM | POA: Diagnosis not present

## 2020-12-20 DIAGNOSIS — G8929 Other chronic pain: Secondary | ICD-10-CM | POA: Diagnosis not present

## 2020-12-20 DIAGNOSIS — D509 Iron deficiency anemia, unspecified: Secondary | ICD-10-CM | POA: Diagnosis not present

## 2020-12-20 DIAGNOSIS — E1129 Type 2 diabetes mellitus with other diabetic kidney complication: Secondary | ICD-10-CM | POA: Diagnosis not present

## 2020-12-23 DIAGNOSIS — Z992 Dependence on renal dialysis: Secondary | ICD-10-CM | POA: Diagnosis not present

## 2020-12-23 DIAGNOSIS — D509 Iron deficiency anemia, unspecified: Secondary | ICD-10-CM | POA: Diagnosis not present

## 2020-12-23 DIAGNOSIS — N186 End stage renal disease: Secondary | ICD-10-CM | POA: Diagnosis not present

## 2020-12-23 DIAGNOSIS — E1129 Type 2 diabetes mellitus with other diabetic kidney complication: Secondary | ICD-10-CM | POA: Diagnosis not present

## 2020-12-23 DIAGNOSIS — N2581 Secondary hyperparathyroidism of renal origin: Secondary | ICD-10-CM | POA: Diagnosis not present

## 2020-12-23 DIAGNOSIS — G8929 Other chronic pain: Secondary | ICD-10-CM | POA: Diagnosis not present

## 2020-12-23 DIAGNOSIS — D688 Other specified coagulation defects: Secondary | ICD-10-CM | POA: Diagnosis not present

## 2020-12-23 DIAGNOSIS — R519 Headache, unspecified: Secondary | ICD-10-CM | POA: Diagnosis not present

## 2020-12-23 DIAGNOSIS — D631 Anemia in chronic kidney disease: Secondary | ICD-10-CM | POA: Diagnosis not present

## 2020-12-25 DIAGNOSIS — R519 Headache, unspecified: Secondary | ICD-10-CM | POA: Diagnosis not present

## 2020-12-25 DIAGNOSIS — E1129 Type 2 diabetes mellitus with other diabetic kidney complication: Secondary | ICD-10-CM | POA: Diagnosis not present

## 2020-12-25 DIAGNOSIS — N2581 Secondary hyperparathyroidism of renal origin: Secondary | ICD-10-CM | POA: Diagnosis not present

## 2020-12-25 DIAGNOSIS — D631 Anemia in chronic kidney disease: Secondary | ICD-10-CM | POA: Diagnosis not present

## 2020-12-25 DIAGNOSIS — G8929 Other chronic pain: Secondary | ICD-10-CM | POA: Diagnosis not present

## 2020-12-25 DIAGNOSIS — D509 Iron deficiency anemia, unspecified: Secondary | ICD-10-CM | POA: Diagnosis not present

## 2020-12-25 DIAGNOSIS — Z992 Dependence on renal dialysis: Secondary | ICD-10-CM | POA: Diagnosis not present

## 2020-12-25 DIAGNOSIS — D688 Other specified coagulation defects: Secondary | ICD-10-CM | POA: Diagnosis not present

## 2020-12-25 DIAGNOSIS — N186 End stage renal disease: Secondary | ICD-10-CM | POA: Diagnosis not present

## 2020-12-27 DIAGNOSIS — N186 End stage renal disease: Secondary | ICD-10-CM | POA: Diagnosis not present

## 2020-12-27 DIAGNOSIS — G8929 Other chronic pain: Secondary | ICD-10-CM | POA: Diagnosis not present

## 2020-12-27 DIAGNOSIS — D688 Other specified coagulation defects: Secondary | ICD-10-CM | POA: Diagnosis not present

## 2020-12-27 DIAGNOSIS — D631 Anemia in chronic kidney disease: Secondary | ICD-10-CM | POA: Diagnosis not present

## 2020-12-27 DIAGNOSIS — D509 Iron deficiency anemia, unspecified: Secondary | ICD-10-CM | POA: Diagnosis not present

## 2020-12-27 DIAGNOSIS — N2581 Secondary hyperparathyroidism of renal origin: Secondary | ICD-10-CM | POA: Diagnosis not present

## 2020-12-27 DIAGNOSIS — R519 Headache, unspecified: Secondary | ICD-10-CM | POA: Diagnosis not present

## 2020-12-27 DIAGNOSIS — E1129 Type 2 diabetes mellitus with other diabetic kidney complication: Secondary | ICD-10-CM | POA: Diagnosis not present

## 2020-12-27 DIAGNOSIS — Z992 Dependence on renal dialysis: Secondary | ICD-10-CM | POA: Diagnosis not present

## 2020-12-30 DIAGNOSIS — N2581 Secondary hyperparathyroidism of renal origin: Secondary | ICD-10-CM | POA: Diagnosis not present

## 2020-12-30 DIAGNOSIS — E1129 Type 2 diabetes mellitus with other diabetic kidney complication: Secondary | ICD-10-CM | POA: Diagnosis not present

## 2020-12-30 DIAGNOSIS — R519 Headache, unspecified: Secondary | ICD-10-CM | POA: Diagnosis not present

## 2020-12-30 DIAGNOSIS — D631 Anemia in chronic kidney disease: Secondary | ICD-10-CM | POA: Diagnosis not present

## 2020-12-30 DIAGNOSIS — D688 Other specified coagulation defects: Secondary | ICD-10-CM | POA: Diagnosis not present

## 2020-12-30 DIAGNOSIS — Z992 Dependence on renal dialysis: Secondary | ICD-10-CM | POA: Diagnosis not present

## 2020-12-30 DIAGNOSIS — G8929 Other chronic pain: Secondary | ICD-10-CM | POA: Diagnosis not present

## 2020-12-30 DIAGNOSIS — N186 End stage renal disease: Secondary | ICD-10-CM | POA: Diagnosis not present

## 2020-12-30 DIAGNOSIS — D509 Iron deficiency anemia, unspecified: Secondary | ICD-10-CM | POA: Diagnosis not present

## 2021-01-01 DIAGNOSIS — D509 Iron deficiency anemia, unspecified: Secondary | ICD-10-CM | POA: Diagnosis not present

## 2021-01-01 DIAGNOSIS — N186 End stage renal disease: Secondary | ICD-10-CM | POA: Diagnosis not present

## 2021-01-01 DIAGNOSIS — R519 Headache, unspecified: Secondary | ICD-10-CM | POA: Diagnosis not present

## 2021-01-01 DIAGNOSIS — G8929 Other chronic pain: Secondary | ICD-10-CM | POA: Diagnosis not present

## 2021-01-01 DIAGNOSIS — D631 Anemia in chronic kidney disease: Secondary | ICD-10-CM | POA: Diagnosis not present

## 2021-01-01 DIAGNOSIS — Z992 Dependence on renal dialysis: Secondary | ICD-10-CM | POA: Diagnosis not present

## 2021-01-01 DIAGNOSIS — D688 Other specified coagulation defects: Secondary | ICD-10-CM | POA: Diagnosis not present

## 2021-01-01 DIAGNOSIS — E1129 Type 2 diabetes mellitus with other diabetic kidney complication: Secondary | ICD-10-CM | POA: Diagnosis not present

## 2021-01-01 DIAGNOSIS — N2581 Secondary hyperparathyroidism of renal origin: Secondary | ICD-10-CM | POA: Diagnosis not present

## 2021-01-02 ENCOUNTER — Other Ambulatory Visit (HOSPITAL_COMMUNITY): Payer: Self-pay | Admitting: Chiropractic Medicine

## 2021-01-02 MED FILL — oxyCODONE HCL 15 MG TABS: 15 | 30 days supply | Qty: 120 | Fill #0

## 2021-01-03 DIAGNOSIS — G8929 Other chronic pain: Secondary | ICD-10-CM | POA: Diagnosis not present

## 2021-01-03 DIAGNOSIS — N2581 Secondary hyperparathyroidism of renal origin: Secondary | ICD-10-CM | POA: Diagnosis not present

## 2021-01-03 DIAGNOSIS — R519 Headache, unspecified: Secondary | ICD-10-CM | POA: Diagnosis not present

## 2021-01-03 DIAGNOSIS — N186 End stage renal disease: Secondary | ICD-10-CM | POA: Diagnosis not present

## 2021-01-03 DIAGNOSIS — Z992 Dependence on renal dialysis: Secondary | ICD-10-CM | POA: Diagnosis not present

## 2021-01-03 DIAGNOSIS — D688 Other specified coagulation defects: Secondary | ICD-10-CM | POA: Diagnosis not present

## 2021-01-03 DIAGNOSIS — D631 Anemia in chronic kidney disease: Secondary | ICD-10-CM | POA: Diagnosis not present

## 2021-01-03 DIAGNOSIS — D509 Iron deficiency anemia, unspecified: Secondary | ICD-10-CM | POA: Diagnosis not present

## 2021-01-03 DIAGNOSIS — E1129 Type 2 diabetes mellitus with other diabetic kidney complication: Secondary | ICD-10-CM | POA: Diagnosis not present

## 2021-01-06 DIAGNOSIS — D631 Anemia in chronic kidney disease: Secondary | ICD-10-CM | POA: Diagnosis not present

## 2021-01-06 DIAGNOSIS — D688 Other specified coagulation defects: Secondary | ICD-10-CM | POA: Diagnosis not present

## 2021-01-06 DIAGNOSIS — E1129 Type 2 diabetes mellitus with other diabetic kidney complication: Secondary | ICD-10-CM | POA: Diagnosis not present

## 2021-01-06 DIAGNOSIS — R519 Headache, unspecified: Secondary | ICD-10-CM | POA: Diagnosis not present

## 2021-01-06 DIAGNOSIS — D509 Iron deficiency anemia, unspecified: Secondary | ICD-10-CM | POA: Diagnosis not present

## 2021-01-06 DIAGNOSIS — N2581 Secondary hyperparathyroidism of renal origin: Secondary | ICD-10-CM | POA: Diagnosis not present

## 2021-01-06 DIAGNOSIS — Z992 Dependence on renal dialysis: Secondary | ICD-10-CM | POA: Diagnosis not present

## 2021-01-06 DIAGNOSIS — N186 End stage renal disease: Secondary | ICD-10-CM | POA: Diagnosis not present

## 2021-01-06 DIAGNOSIS — G8929 Other chronic pain: Secondary | ICD-10-CM | POA: Diagnosis not present

## 2021-01-08 DIAGNOSIS — D688 Other specified coagulation defects: Secondary | ICD-10-CM | POA: Diagnosis not present

## 2021-01-08 DIAGNOSIS — D631 Anemia in chronic kidney disease: Secondary | ICD-10-CM | POA: Diagnosis not present

## 2021-01-08 DIAGNOSIS — R519 Headache, unspecified: Secondary | ICD-10-CM | POA: Diagnosis not present

## 2021-01-08 DIAGNOSIS — E1129 Type 2 diabetes mellitus with other diabetic kidney complication: Secondary | ICD-10-CM | POA: Diagnosis not present

## 2021-01-08 DIAGNOSIS — N2581 Secondary hyperparathyroidism of renal origin: Secondary | ICD-10-CM | POA: Diagnosis not present

## 2021-01-08 DIAGNOSIS — D509 Iron deficiency anemia, unspecified: Secondary | ICD-10-CM | POA: Diagnosis not present

## 2021-01-08 DIAGNOSIS — G8929 Other chronic pain: Secondary | ICD-10-CM | POA: Diagnosis not present

## 2021-01-08 DIAGNOSIS — N186 End stage renal disease: Secondary | ICD-10-CM | POA: Diagnosis not present

## 2021-01-08 DIAGNOSIS — Z992 Dependence on renal dialysis: Secondary | ICD-10-CM | POA: Diagnosis not present

## 2021-01-10 DIAGNOSIS — E1129 Type 2 diabetes mellitus with other diabetic kidney complication: Secondary | ICD-10-CM | POA: Diagnosis not present

## 2021-01-10 DIAGNOSIS — Z992 Dependence on renal dialysis: Secondary | ICD-10-CM | POA: Diagnosis not present

## 2021-01-10 DIAGNOSIS — D631 Anemia in chronic kidney disease: Secondary | ICD-10-CM | POA: Diagnosis not present

## 2021-01-10 DIAGNOSIS — N186 End stage renal disease: Secondary | ICD-10-CM | POA: Diagnosis not present

## 2021-01-10 DIAGNOSIS — D688 Other specified coagulation defects: Secondary | ICD-10-CM | POA: Diagnosis not present

## 2021-01-10 DIAGNOSIS — D509 Iron deficiency anemia, unspecified: Secondary | ICD-10-CM | POA: Diagnosis not present

## 2021-01-10 DIAGNOSIS — R519 Headache, unspecified: Secondary | ICD-10-CM | POA: Diagnosis not present

## 2021-01-10 DIAGNOSIS — G8929 Other chronic pain: Secondary | ICD-10-CM | POA: Diagnosis not present

## 2021-01-10 DIAGNOSIS — N2581 Secondary hyperparathyroidism of renal origin: Secondary | ICD-10-CM | POA: Diagnosis not present

## 2021-01-13 DIAGNOSIS — D631 Anemia in chronic kidney disease: Secondary | ICD-10-CM | POA: Diagnosis not present

## 2021-01-13 DIAGNOSIS — D509 Iron deficiency anemia, unspecified: Secondary | ICD-10-CM | POA: Diagnosis not present

## 2021-01-13 DIAGNOSIS — G8929 Other chronic pain: Secondary | ICD-10-CM | POA: Diagnosis not present

## 2021-01-13 DIAGNOSIS — N2581 Secondary hyperparathyroidism of renal origin: Secondary | ICD-10-CM | POA: Diagnosis not present

## 2021-01-13 DIAGNOSIS — D688 Other specified coagulation defects: Secondary | ICD-10-CM | POA: Diagnosis not present

## 2021-01-13 DIAGNOSIS — N186 End stage renal disease: Secondary | ICD-10-CM | POA: Diagnosis not present

## 2021-01-13 DIAGNOSIS — Z992 Dependence on renal dialysis: Secondary | ICD-10-CM | POA: Diagnosis not present

## 2021-01-15 DIAGNOSIS — D631 Anemia in chronic kidney disease: Secondary | ICD-10-CM | POA: Diagnosis not present

## 2021-01-15 DIAGNOSIS — N186 End stage renal disease: Secondary | ICD-10-CM | POA: Diagnosis not present

## 2021-01-15 DIAGNOSIS — Z992 Dependence on renal dialysis: Secondary | ICD-10-CM | POA: Diagnosis not present

## 2021-01-15 DIAGNOSIS — D688 Other specified coagulation defects: Secondary | ICD-10-CM | POA: Diagnosis not present

## 2021-01-15 DIAGNOSIS — N2581 Secondary hyperparathyroidism of renal origin: Secondary | ICD-10-CM | POA: Diagnosis not present

## 2021-01-15 DIAGNOSIS — G8929 Other chronic pain: Secondary | ICD-10-CM | POA: Diagnosis not present

## 2021-01-15 DIAGNOSIS — D509 Iron deficiency anemia, unspecified: Secondary | ICD-10-CM | POA: Diagnosis not present

## 2021-01-17 DIAGNOSIS — Z992 Dependence on renal dialysis: Secondary | ICD-10-CM | POA: Diagnosis not present

## 2021-01-17 DIAGNOSIS — D631 Anemia in chronic kidney disease: Secondary | ICD-10-CM | POA: Diagnosis not present

## 2021-01-17 DIAGNOSIS — G8929 Other chronic pain: Secondary | ICD-10-CM | POA: Diagnosis not present

## 2021-01-17 DIAGNOSIS — D509 Iron deficiency anemia, unspecified: Secondary | ICD-10-CM | POA: Diagnosis not present

## 2021-01-17 DIAGNOSIS — N2581 Secondary hyperparathyroidism of renal origin: Secondary | ICD-10-CM | POA: Diagnosis not present

## 2021-01-17 DIAGNOSIS — N186 End stage renal disease: Secondary | ICD-10-CM | POA: Diagnosis not present

## 2021-01-17 DIAGNOSIS — D688 Other specified coagulation defects: Secondary | ICD-10-CM | POA: Diagnosis not present

## 2021-01-20 DIAGNOSIS — G8929 Other chronic pain: Secondary | ICD-10-CM | POA: Diagnosis not present

## 2021-01-20 DIAGNOSIS — N186 End stage renal disease: Secondary | ICD-10-CM | POA: Diagnosis not present

## 2021-01-20 DIAGNOSIS — D688 Other specified coagulation defects: Secondary | ICD-10-CM | POA: Diagnosis not present

## 2021-01-20 DIAGNOSIS — D509 Iron deficiency anemia, unspecified: Secondary | ICD-10-CM | POA: Diagnosis not present

## 2021-01-20 DIAGNOSIS — D631 Anemia in chronic kidney disease: Secondary | ICD-10-CM | POA: Diagnosis not present

## 2021-01-20 DIAGNOSIS — N2581 Secondary hyperparathyroidism of renal origin: Secondary | ICD-10-CM | POA: Diagnosis not present

## 2021-01-20 DIAGNOSIS — Z992 Dependence on renal dialysis: Secondary | ICD-10-CM | POA: Diagnosis not present

## 2021-01-21 DIAGNOSIS — I871 Compression of vein: Secondary | ICD-10-CM | POA: Diagnosis not present

## 2021-01-21 DIAGNOSIS — Z992 Dependence on renal dialysis: Secondary | ICD-10-CM | POA: Diagnosis not present

## 2021-01-21 DIAGNOSIS — T82858A Stenosis of vascular prosthetic devices, implants and grafts, initial encounter: Secondary | ICD-10-CM | POA: Diagnosis not present

## 2021-01-21 DIAGNOSIS — N186 End stage renal disease: Secondary | ICD-10-CM | POA: Diagnosis not present

## 2021-01-22 DIAGNOSIS — D631 Anemia in chronic kidney disease: Secondary | ICD-10-CM | POA: Diagnosis not present

## 2021-01-22 DIAGNOSIS — Z992 Dependence on renal dialysis: Secondary | ICD-10-CM | POA: Diagnosis not present

## 2021-01-22 DIAGNOSIS — G8929 Other chronic pain: Secondary | ICD-10-CM | POA: Diagnosis not present

## 2021-01-22 DIAGNOSIS — D688 Other specified coagulation defects: Secondary | ICD-10-CM | POA: Diagnosis not present

## 2021-01-22 DIAGNOSIS — D509 Iron deficiency anemia, unspecified: Secondary | ICD-10-CM | POA: Diagnosis not present

## 2021-01-22 DIAGNOSIS — N186 End stage renal disease: Secondary | ICD-10-CM | POA: Diagnosis not present

## 2021-01-22 DIAGNOSIS — N2581 Secondary hyperparathyroidism of renal origin: Secondary | ICD-10-CM | POA: Diagnosis not present

## 2021-01-24 DIAGNOSIS — Z992 Dependence on renal dialysis: Secondary | ICD-10-CM | POA: Diagnosis not present

## 2021-01-24 DIAGNOSIS — N186 End stage renal disease: Secondary | ICD-10-CM | POA: Diagnosis not present

## 2021-01-24 DIAGNOSIS — D509 Iron deficiency anemia, unspecified: Secondary | ICD-10-CM | POA: Diagnosis not present

## 2021-01-24 DIAGNOSIS — N2581 Secondary hyperparathyroidism of renal origin: Secondary | ICD-10-CM | POA: Diagnosis not present

## 2021-01-24 DIAGNOSIS — G8929 Other chronic pain: Secondary | ICD-10-CM | POA: Diagnosis not present

## 2021-01-24 DIAGNOSIS — D688 Other specified coagulation defects: Secondary | ICD-10-CM | POA: Diagnosis not present

## 2021-01-24 DIAGNOSIS — D631 Anemia in chronic kidney disease: Secondary | ICD-10-CM | POA: Diagnosis not present

## 2021-01-27 ENCOUNTER — Other Ambulatory Visit: Payer: Self-pay

## 2021-01-27 DIAGNOSIS — D631 Anemia in chronic kidney disease: Secondary | ICD-10-CM | POA: Diagnosis not present

## 2021-01-27 DIAGNOSIS — D688 Other specified coagulation defects: Secondary | ICD-10-CM | POA: Diagnosis not present

## 2021-01-27 DIAGNOSIS — Z992 Dependence on renal dialysis: Secondary | ICD-10-CM | POA: Diagnosis not present

## 2021-01-27 DIAGNOSIS — N2581 Secondary hyperparathyroidism of renal origin: Secondary | ICD-10-CM | POA: Diagnosis not present

## 2021-01-27 DIAGNOSIS — D509 Iron deficiency anemia, unspecified: Secondary | ICD-10-CM | POA: Diagnosis not present

## 2021-01-27 DIAGNOSIS — N186 End stage renal disease: Secondary | ICD-10-CM | POA: Diagnosis not present

## 2021-01-27 DIAGNOSIS — G8929 Other chronic pain: Secondary | ICD-10-CM | POA: Diagnosis not present

## 2021-01-28 ENCOUNTER — Ambulatory Visit (INDEPENDENT_AMBULATORY_CARE_PROVIDER_SITE_OTHER): Payer: Medicare Other | Admitting: Internal Medicine

## 2021-01-28 ENCOUNTER — Other Ambulatory Visit: Payer: Self-pay

## 2021-01-28 ENCOUNTER — Encounter: Payer: Self-pay | Admitting: Internal Medicine

## 2021-01-28 DIAGNOSIS — R5382 Chronic fatigue, unspecified: Secondary | ICD-10-CM | POA: Diagnosis not present

## 2021-01-28 DIAGNOSIS — Z992 Dependence on renal dialysis: Secondary | ICD-10-CM | POA: Diagnosis not present

## 2021-01-28 DIAGNOSIS — E119 Type 2 diabetes mellitus without complications: Secondary | ICD-10-CM

## 2021-01-28 DIAGNOSIS — F419 Anxiety disorder, unspecified: Secondary | ICD-10-CM | POA: Diagnosis not present

## 2021-01-28 DIAGNOSIS — N186 End stage renal disease: Secondary | ICD-10-CM | POA: Diagnosis not present

## 2021-01-28 DIAGNOSIS — R413 Other amnesia: Secondary | ICD-10-CM

## 2021-01-28 DIAGNOSIS — I10 Essential (primary) hypertension: Secondary | ICD-10-CM

## 2021-01-28 MED ORDER — OXYCODONE HCL 15 MG PO TABS: 15.0000 mg | ORAL_TABLET | Freq: Four times a day (QID) | ORAL | 0 refills | Status: DC | PRN

## 2021-01-28 NOTE — Assessment & Plan Note (Signed)
Dr Dwyane Dee  Glimepiride , Actos

## 2021-01-28 NOTE — Assessment & Plan Note (Addendum)
I think thatBecky   is doing very well on HD in spite of her multiple complaints and problems

## 2021-01-28 NOTE — Assessment & Plan Note (Signed)
On HD - better

## 2021-01-28 NOTE — Assessment & Plan Note (Signed)
Lorazepam prn  Potential benefits of a long term benzodiazepines  use as well as potential risks  and complications were explained to the patient and were aknowledged.  

## 2021-01-28 NOTE — Progress Notes (Signed)
Subjective:  Patient ID: [name], female    DOB: 01-14-1951  Age: 70 y.o. MRN: [id number]  CC: Follow-up (3 month f/u)   HPI [name] presents for ESRD, HTN, DM, LBP, anxiety f/u  Outpatient Medications Prior to Visit  Medication Sig Dispense Refill  . acetaminophen (TYLENOL) 500 MG tablet Take 1,000 mg by mouth every 6 (six) hours as needed for moderate pain or headache.    Marland Kitchen amoxicillin (AMOXIL) 500 MG capsule TAKE 4 CAPSULES BY MOUTH 1  HOUR PRIOR TO DENTAL  PROCEDURE/CLEANING (Patient taking differently: Take 2,000 mg by mouth See admin instructions. Take 2000 mg 1 hour prior to dental work) 16 capsule 1  . aspirin-acetaminophen-caffeine (EXCEDRIN MIGRAINE) 250-250-65 MG tablet Excedrin Migraine 250 mg-250 mg-65 mg tablet  Take by oral route.    . B Complex-C-Folic Acid (RENA-VITE PO) Take 1 tablet a day    . blood glucose meter kit and supplies Dispense based on patient and insurance preference. Used to check blood sugar daily, DX: E11.9 OneTouch 1 each 0  . buPROPion (WELLBUTRIN XL) 300 MG 24 hr tablet Take 1 tablet (300 mg total) by mouth at bedtime. 90 tablet 3  . cyclobenzaprine (FLEXERIL) 10 MG tablet Take 1 tablet (10 mg total) by mouth 3 (three) times daily as needed for muscle spasms. 270 tablet 3  . dicyclomine (BENTYL) 20 MG tablet Take one tablet every 4-6 hours as needed for abdominal pain (Patient taking differently: Take 20 mg by mouth every 4 (four) hours as needed (abdominal pain).) 60 tablet 2  . DULoxetine (CYMBALTA) 60 MG capsule Take 1 capsule (60 mg total) by mouth at bedtime. 90 capsule 3  . fexofenadine (ALLEGRA) 180 MG tablet Take 1 tablet (180 mg total) by mouth daily. (Patient taking differently: Take 180 mg by mouth daily as needed for allergies.) 90 tablet 3  . glimepiride (AMARYL) 1 MG tablet Take 1 tablet (1 mg total) by mouth daily before breakfast. 90 tablet 3  . glucose blood (TRUETEST TEST) test strip Used to check blood sugar daily, DX:  E11.9 100 each 3  . Lancets (ONETOUCH ULTRASOFT) lancets Used to check blood sugar daily, DX: E11.9 100 each 3  . lidocaine (XYLOCAINE) 5 % ointment APPLY TO AFFECTED AREA(S)  TOPICALLY 3 TIMES DAILY AS  NEEDED 106.32 g 3  . lidocaine-prilocaine (EMLA) cream SMARTSIG:Sparingly Topical As Directed    . LORazepam (ATIVAN) 2 MG tablet 1 po qhs prn insomnia 90 tablet 1  . multivitamin (RENA-VIT) TABS tablet Take 1 tablet by mouth daily.    . pantoprazole (PROTONIX) 40 MG tablet Take 1 tablet (40 mg total) by mouth daily. 90 tablet 3  . pilocarpine (SALAGEN) 5 MG tablet Take 1 tablet (5 mg total) by mouth 2 (two) times daily. 180 tablet 3  . pioglitazone (ACTOS) 15 MG tablet Take 1 tablet (15 mg total) by mouth daily. 90 tablet 3  . polyethylene glycol (MIRALAX / GLYCOLAX) 17 g packet Take 17 g by mouth daily as needed (constipation.).    Marland Kitchen polyvinyl alcohol (LIQUIFILM TEARS) 1.4 % ophthalmic solution Place 1 drop into both eyes daily as needed for dry eyes.    . sevelamer carbonate (RENVELA) 800 MG tablet Take 800 mg by mouth 5 (five) times daily.    . solifenacin (VESICARE) 5 MG tablet Take 1 tablet (5 mg total) by mouth daily. 90 tablet 3  . ketoconazole (NIZORAL) 2 % cream Apply 1 application topically daily as needed for irritation (  skin fold (heat rash)). (Patient not taking: Reported on 01/28/2021)    . ketoconazole (NIZORAL) 200 MG tablet Take 0.5 tablets (100 mg total) by mouth daily. (Patient not taking: Reported on 01/28/2021) 20 tablet 2  . oxyCODONE (ROXICODONE) 15 MG immediate release tablet Take 1 tablet (15 mg total) by mouth every 6 (six) hours as needed for pain. (Patient not taking: Reported on 01/28/2021) 8 tablet 0  . oxyCODONE (ROXICODONE) 5 MG immediate release tablet Take 1 tablet (5 mg total) by mouth every 6 (six) hours as needed. (Patient not taking: Reported on 01/28/2021) 12 tablet 0  . triamcinolone (NASACORT) 55 MCG/ACT AERO nasal inhaler Place 2 sprays into the nose daily.  (Patient not taking: Reported on 01/28/2021) 3 each 3   No facility-administered medications prior to visit.    ROS: Review of Systems  Constitutional: Positive for fatigue. Negative for activity change, appetite change, chills and unexpected weight change.  HENT: Negative for congestion, mouth sores and sinus pressure.   Eyes: Negative for visual disturbance.  Respiratory: Negative for cough and chest tightness.   Gastrointestinal: Negative for abdominal pain and nausea.  Genitourinary: Negative for difficulty urinating, frequency and vaginal pain.  Musculoskeletal: Positive for arthralgias, back pain and gait problem.  Skin: Negative for pallor and rash.  Neurological: Negative for dizziness, tremors, weakness, numbness and headaches.  Psychiatric/Behavioral: Positive for dysphoric mood. Negative for confusion, sleep disturbance and suicidal ideas. The patient is nervous/anxious.     Objective:  BP 120/68 (BP Location: Left Arm)   Pulse 86   Temp 98.1 F (36.7 C) (Oral)   Ht 5' 2" (1.575 m)   Wt 219 lb 12.8 oz (99.7 kg)   SpO2 97%   BMI 40.20 kg/m   BP Readings from Last 3 Encounters:  01/28/21 120/68  12/15/20 120/72  12/10/20 140/80    Wt Readings from Last 3 Encounters:  01/28/21 219 lb 12.8 oz (99.7 kg)  12/15/20 224 lb 6.4 oz (101.8 kg)  12/10/20 227 lb 9.6 oz (103.2 kg)    Physical Exam Constitutional:      General: She is not in acute distress.    Appearance: She is well-developed. She is obese.  HENT:     Head: Normocephalic.     Right Ear: External ear normal.     Left Ear: External ear normal.     Nose: Nose normal.     Mouth/Throat:     Mouth: Oropharynx is clear and moist.  Eyes:     General:        Right eye: No discharge.        Left eye: No discharge.     Conjunctiva/sclera: Conjunctivae normal.     Pupils: Pupils are equal, round, and reactive to light.  Neck:     Thyroid: No thyromegaly.     Vascular: No JVD.     Trachea: No tracheal  deviation.  Cardiovascular:     Rate and Rhythm: Normal rate and regular rhythm.     Heart sounds: Normal heart sounds.  Pulmonary:     Effort: No respiratory distress.     Breath sounds: No stridor. No wheezing.  Abdominal:     General: Bowel sounds are normal. There is no distension.     Palpations: Abdomen is soft. There is no mass.     Tenderness: There is no abdominal tenderness. There is no guarding or rebound.  Musculoskeletal:        General: Tenderness present. No edema.  Cervical back: Normal range of motion and neck supple.  Lymphadenopathy:     Cervical: No cervical adenopathy.  Skin:    Findings: No erythema or rash.  Neurological:     Mental Status: She is oriented to person, place, and time.     Cranial Nerves: No cranial nerve deficit.     Motor: No abnormal muscle tone.     Coordination: Coordination abnormal.     Deep Tendon Reflexes: Reflexes normal.  Psychiatric:        Mood and Affect: Mood and affect normal.        Behavior: Behavior normal.        Thought Content: Thought content normal.        Judgment: Judgment normal.   using a trekking pole  Lab Results  Component Value Date   WBC 10.0 04/16/2020   HGB 11.9 (L) 10/27/2020   HCT 35.0 (L) 10/27/2020   PLT 213.0 04/16/2020   GLUCOSE 146 (H) 12/15/2020   CHOL 215 (H) 09/25/2014   TRIG 154.0 (H) 09/25/2014   HDL 46.00 09/25/2014   LDLCALC 138 (H) 09/25/2014   ALT 13 12/15/2020   AST 13 12/15/2020   NA 138 12/15/2020   K 3.5 12/15/2020   CL 98 12/15/2020   CREATININE 2.40 (H) 12/15/2020   BUN 25 (H) 12/15/2020   CO2 31 12/15/2020   TSH 3.98 04/16/2020   INR 1.04 02/26/2015   HGBA1C 6.0 12/15/2020    US BREAST LTD UNI LEFT INC AXILLA  Result Date: 11/12/2020 CLINICAL DATA:  69-year-old female for six-month follow-up of LEFT breast mass. EXAM: ULTRASOUND OF THE LEFT BREAST COMPARISON:  Previous exam(s). FINDINGS: Targeted ultrasound is performed, showing a stable 1.3 x 0.7 x 1 cm  circumscribed oval hyperechoic parallel mass at the 11 o'clock position of the LEFT breast 9 cm from the nipple. IMPRESSION: 1. Stable 1.3 cm likely benign mass within the UPPER INNER LEFT breast. Six-month follow-up recommended to ensure 1 year stability. RECOMMENDATION: Bilateral diagnostic mammogram with LEFT breast ultrasound in 6 months. I have discussed the findings and recommendations with the patient. If applicable, a reminder letter will be sent to the patient regarding the next appointment. BI-RADS CATEGORY  3: Probably benign. Electronically Signed   By: Jeffrey  Hu M.D.   On: 11/12/2020 13:06    Assessment & Plan:    Alex Plotnikov, MD 

## 2021-01-28 NOTE — Assessment & Plan Note (Signed)
Doing better on HD

## 2021-01-28 NOTE — Assessment & Plan Note (Signed)
Resolved on HD

## 2021-01-29 DIAGNOSIS — N186 End stage renal disease: Secondary | ICD-10-CM | POA: Diagnosis not present

## 2021-01-29 DIAGNOSIS — D509 Iron deficiency anemia, unspecified: Secondary | ICD-10-CM | POA: Diagnosis not present

## 2021-01-29 DIAGNOSIS — D631 Anemia in chronic kidney disease: Secondary | ICD-10-CM | POA: Diagnosis not present

## 2021-01-29 DIAGNOSIS — G8929 Other chronic pain: Secondary | ICD-10-CM | POA: Diagnosis not present

## 2021-01-29 DIAGNOSIS — Z992 Dependence on renal dialysis: Secondary | ICD-10-CM | POA: Diagnosis not present

## 2021-01-29 DIAGNOSIS — N2581 Secondary hyperparathyroidism of renal origin: Secondary | ICD-10-CM | POA: Diagnosis not present

## 2021-01-29 DIAGNOSIS — D688 Other specified coagulation defects: Secondary | ICD-10-CM | POA: Diagnosis not present

## 2021-01-31 DIAGNOSIS — D631 Anemia in chronic kidney disease: Secondary | ICD-10-CM | POA: Diagnosis not present

## 2021-01-31 DIAGNOSIS — D688 Other specified coagulation defects: Secondary | ICD-10-CM | POA: Diagnosis not present

## 2021-01-31 DIAGNOSIS — N186 End stage renal disease: Secondary | ICD-10-CM | POA: Diagnosis not present

## 2021-01-31 DIAGNOSIS — Z992 Dependence on renal dialysis: Secondary | ICD-10-CM | POA: Diagnosis not present

## 2021-01-31 DIAGNOSIS — N2581 Secondary hyperparathyroidism of renal origin: Secondary | ICD-10-CM | POA: Diagnosis not present

## 2021-01-31 DIAGNOSIS — D509 Iron deficiency anemia, unspecified: Secondary | ICD-10-CM | POA: Diagnosis not present

## 2021-01-31 DIAGNOSIS — G8929 Other chronic pain: Secondary | ICD-10-CM | POA: Diagnosis not present

## 2021-02-03 DIAGNOSIS — D688 Other specified coagulation defects: Secondary | ICD-10-CM | POA: Diagnosis not present

## 2021-02-03 DIAGNOSIS — D509 Iron deficiency anemia, unspecified: Secondary | ICD-10-CM | POA: Diagnosis not present

## 2021-02-03 DIAGNOSIS — D631 Anemia in chronic kidney disease: Secondary | ICD-10-CM | POA: Diagnosis not present

## 2021-02-03 DIAGNOSIS — Z992 Dependence on renal dialysis: Secondary | ICD-10-CM | POA: Diagnosis not present

## 2021-02-03 DIAGNOSIS — N186 End stage renal disease: Secondary | ICD-10-CM | POA: Diagnosis not present

## 2021-02-03 DIAGNOSIS — G8929 Other chronic pain: Secondary | ICD-10-CM | POA: Diagnosis not present

## 2021-02-03 DIAGNOSIS — N2581 Secondary hyperparathyroidism of renal origin: Secondary | ICD-10-CM | POA: Diagnosis not present

## 2021-02-04 ENCOUNTER — Other Ambulatory Visit (HOSPITAL_COMMUNITY): Payer: Self-pay | Admitting: Chiropractic Medicine

## 2021-02-04 MED FILL — oxyCODONE HCL 15 MG TABS: 15 | 30 days supply | Qty: 120 | Fill #0

## 2021-02-05 ENCOUNTER — Other Ambulatory Visit: Payer: Self-pay | Admitting: Internal Medicine

## 2021-02-05 DIAGNOSIS — Z992 Dependence on renal dialysis: Secondary | ICD-10-CM | POA: Diagnosis not present

## 2021-02-05 DIAGNOSIS — N2581 Secondary hyperparathyroidism of renal origin: Secondary | ICD-10-CM | POA: Diagnosis not present

## 2021-02-05 DIAGNOSIS — D688 Other specified coagulation defects: Secondary | ICD-10-CM | POA: Diagnosis not present

## 2021-02-05 DIAGNOSIS — D509 Iron deficiency anemia, unspecified: Secondary | ICD-10-CM | POA: Diagnosis not present

## 2021-02-05 DIAGNOSIS — D631 Anemia in chronic kidney disease: Secondary | ICD-10-CM | POA: Diagnosis not present

## 2021-02-05 DIAGNOSIS — N186 End stage renal disease: Secondary | ICD-10-CM | POA: Diagnosis not present

## 2021-02-05 DIAGNOSIS — G8929 Other chronic pain: Secondary | ICD-10-CM | POA: Diagnosis not present

## 2021-02-05 NOTE — Telephone Encounter (Signed)
1.Medication Requested: LORazepam (ATIVAN) 2 MG tablet    2. Pharmacy (Name, [facility] [place]): [facility], [place].  3. On Med List: yes   4. Last Visit with PCP: 2.16.22  5. Next visit date with PCP: 6.15.22   Agent: Please be advised that RX refills may take up to 3 business days. We ask that you follow-up with your pharmacy.

## 2021-02-07 DIAGNOSIS — D631 Anemia in chronic kidney disease: Secondary | ICD-10-CM | POA: Diagnosis not present

## 2021-02-07 DIAGNOSIS — D688 Other specified coagulation defects: Secondary | ICD-10-CM | POA: Diagnosis not present

## 2021-02-07 DIAGNOSIS — D509 Iron deficiency anemia, unspecified: Secondary | ICD-10-CM | POA: Diagnosis not present

## 2021-02-07 DIAGNOSIS — G8929 Other chronic pain: Secondary | ICD-10-CM | POA: Diagnosis not present

## 2021-02-07 DIAGNOSIS — N186 End stage renal disease: Secondary | ICD-10-CM | POA: Diagnosis not present

## 2021-02-07 DIAGNOSIS — N2581 Secondary hyperparathyroidism of renal origin: Secondary | ICD-10-CM | POA: Diagnosis not present

## 2021-02-07 DIAGNOSIS — Z992 Dependence on renal dialysis: Secondary | ICD-10-CM | POA: Diagnosis not present

## 2021-02-09 ENCOUNTER — Other Ambulatory Visit (HOSPITAL_COMMUNITY): Payer: Self-pay | Admitting: Internal Medicine

## 2021-02-09 DIAGNOSIS — Z992 Dependence on renal dialysis: Secondary | ICD-10-CM | POA: Diagnosis not present

## 2021-02-09 DIAGNOSIS — E1022 Type 1 diabetes mellitus with diabetic chronic kidney disease: Secondary | ICD-10-CM | POA: Diagnosis not present

## 2021-02-09 DIAGNOSIS — N186 End stage renal disease: Secondary | ICD-10-CM | POA: Diagnosis not present

## 2021-02-10 DIAGNOSIS — D688 Other specified coagulation defects: Secondary | ICD-10-CM | POA: Diagnosis not present

## 2021-02-10 DIAGNOSIS — N186 End stage renal disease: Secondary | ICD-10-CM | POA: Diagnosis not present

## 2021-02-10 DIAGNOSIS — N2581 Secondary hyperparathyroidism of renal origin: Secondary | ICD-10-CM | POA: Diagnosis not present

## 2021-02-10 DIAGNOSIS — Z992 Dependence on renal dialysis: Secondary | ICD-10-CM | POA: Diagnosis not present

## 2021-02-10 DIAGNOSIS — G8929 Other chronic pain: Secondary | ICD-10-CM | POA: Diagnosis not present

## 2021-02-12 DIAGNOSIS — N2581 Secondary hyperparathyroidism of renal origin: Secondary | ICD-10-CM | POA: Diagnosis not present

## 2021-02-12 DIAGNOSIS — Z992 Dependence on renal dialysis: Secondary | ICD-10-CM | POA: Diagnosis not present

## 2021-02-12 DIAGNOSIS — N186 End stage renal disease: Secondary | ICD-10-CM | POA: Diagnosis not present

## 2021-02-12 DIAGNOSIS — G8929 Other chronic pain: Secondary | ICD-10-CM | POA: Diagnosis not present

## 2021-02-12 DIAGNOSIS — D688 Other specified coagulation defects: Secondary | ICD-10-CM | POA: Diagnosis not present

## 2021-02-14 DIAGNOSIS — N186 End stage renal disease: Secondary | ICD-10-CM | POA: Diagnosis not present

## 2021-02-14 DIAGNOSIS — G8929 Other chronic pain: Secondary | ICD-10-CM | POA: Diagnosis not present

## 2021-02-14 DIAGNOSIS — D688 Other specified coagulation defects: Secondary | ICD-10-CM | POA: Diagnosis not present

## 2021-02-14 DIAGNOSIS — N2581 Secondary hyperparathyroidism of renal origin: Secondary | ICD-10-CM | POA: Diagnosis not present

## 2021-02-14 DIAGNOSIS — Z992 Dependence on renal dialysis: Secondary | ICD-10-CM | POA: Diagnosis not present

## 2021-02-17 DIAGNOSIS — Z992 Dependence on renal dialysis: Secondary | ICD-10-CM | POA: Diagnosis not present

## 2021-02-17 DIAGNOSIS — D688 Other specified coagulation defects: Secondary | ICD-10-CM | POA: Diagnosis not present

## 2021-02-17 DIAGNOSIS — N2581 Secondary hyperparathyroidism of renal origin: Secondary | ICD-10-CM | POA: Diagnosis not present

## 2021-02-17 DIAGNOSIS — N186 End stage renal disease: Secondary | ICD-10-CM | POA: Diagnosis not present

## 2021-02-17 DIAGNOSIS — G8929 Other chronic pain: Secondary | ICD-10-CM | POA: Diagnosis not present

## 2021-02-19 DIAGNOSIS — N2581 Secondary hyperparathyroidism of renal origin: Secondary | ICD-10-CM | POA: Diagnosis not present

## 2021-02-19 DIAGNOSIS — D688 Other specified coagulation defects: Secondary | ICD-10-CM | POA: Diagnosis not present

## 2021-02-19 DIAGNOSIS — N186 End stage renal disease: Secondary | ICD-10-CM | POA: Diagnosis not present

## 2021-02-19 DIAGNOSIS — Z992 Dependence on renal dialysis: Secondary | ICD-10-CM | POA: Diagnosis not present

## 2021-02-19 DIAGNOSIS — G8929 Other chronic pain: Secondary | ICD-10-CM | POA: Diagnosis not present

## 2021-02-20 ENCOUNTER — Other Ambulatory Visit (HOSPITAL_COMMUNITY): Payer: Self-pay | Admitting: Chiropractic Medicine

## 2021-02-20 DIAGNOSIS — M25532 Pain in left wrist: Secondary | ICD-10-CM | POA: Diagnosis not present

## 2021-02-20 DIAGNOSIS — M792 Neuralgia and neuritis, unspecified: Secondary | ICD-10-CM | POA: Diagnosis not present

## 2021-02-20 MED FILL — PREGABALIN 50 MG CAPS: 50 | 30 days supply | Qty: 60 | Fill #0

## 2021-02-21 DIAGNOSIS — D688 Other specified coagulation defects: Secondary | ICD-10-CM | POA: Diagnosis not present

## 2021-02-21 DIAGNOSIS — Z992 Dependence on renal dialysis: Secondary | ICD-10-CM | POA: Diagnosis not present

## 2021-02-21 DIAGNOSIS — G8929 Other chronic pain: Secondary | ICD-10-CM | POA: Diagnosis not present

## 2021-02-21 DIAGNOSIS — N2581 Secondary hyperparathyroidism of renal origin: Secondary | ICD-10-CM | POA: Diagnosis not present

## 2021-02-21 DIAGNOSIS — N186 End stage renal disease: Secondary | ICD-10-CM | POA: Diagnosis not present

## 2021-02-24 DIAGNOSIS — N186 End stage renal disease: Secondary | ICD-10-CM | POA: Diagnosis not present

## 2021-02-24 DIAGNOSIS — N2581 Secondary hyperparathyroidism of renal origin: Secondary | ICD-10-CM | POA: Diagnosis not present

## 2021-02-24 DIAGNOSIS — D688 Other specified coagulation defects: Secondary | ICD-10-CM | POA: Diagnosis not present

## 2021-02-24 DIAGNOSIS — G8929 Other chronic pain: Secondary | ICD-10-CM | POA: Diagnosis not present

## 2021-02-24 DIAGNOSIS — Z992 Dependence on renal dialysis: Secondary | ICD-10-CM | POA: Diagnosis not present

## 2021-02-26 ENCOUNTER — Telehealth: Payer: Self-pay

## 2021-02-26 ENCOUNTER — Other Ambulatory Visit: Payer: Self-pay

## 2021-02-26 DIAGNOSIS — G8929 Other chronic pain: Secondary | ICD-10-CM | POA: Diagnosis not present

## 2021-02-26 DIAGNOSIS — N186 End stage renal disease: Secondary | ICD-10-CM

## 2021-02-26 DIAGNOSIS — Z992 Dependence on renal dialysis: Secondary | ICD-10-CM | POA: Diagnosis not present

## 2021-02-26 DIAGNOSIS — N2581 Secondary hyperparathyroidism of renal origin: Secondary | ICD-10-CM | POA: Diagnosis not present

## 2021-02-26 DIAGNOSIS — M542 Cervicalgia: Secondary | ICD-10-CM | POA: Diagnosis not present

## 2021-02-26 DIAGNOSIS — D688 Other specified coagulation defects: Secondary | ICD-10-CM | POA: Diagnosis not present

## 2021-02-26 NOTE — Telephone Encounter (Signed)
Received vm from patient asking why HD has so much trouble sticking her access - placed by [facility] in October 2021 - second stage done 10/27/20 . I called [facility] for more information - they advise that "it is not a problem every time." Today, the access infiltrated and they ended up using her port. RN says they are able to use larger gauge needles as time passes. 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 Her arm is "tight as a tick and did not infiltrate - the nurse stuck through the back and then gave me heparin." The patient then became tearful and "said if no one will help me I will just stop going to dialysis, I did not want to do this anyway." I placed patient on Wednesday PA schedule with dialysis duplex - patient dialyzes on [facility] clinic day.

## 2021-02-28 DIAGNOSIS — Z992 Dependence on renal dialysis: Secondary | ICD-10-CM | POA: Diagnosis not present

## 2021-02-28 DIAGNOSIS — N2581 Secondary hyperparathyroidism of renal origin: Secondary | ICD-10-CM | POA: Diagnosis not present

## 2021-02-28 DIAGNOSIS — G8929 Other chronic pain: Secondary | ICD-10-CM | POA: Diagnosis not present

## 2021-02-28 DIAGNOSIS — N186 End stage renal disease: Secondary | ICD-10-CM | POA: Diagnosis not present

## 2021-02-28 DIAGNOSIS — D688 Other specified coagulation defects: Secondary | ICD-10-CM | POA: Diagnosis not present

## 2021-03-03 DIAGNOSIS — D688 Other specified coagulation defects: Secondary | ICD-10-CM | POA: Diagnosis not present

## 2021-03-03 DIAGNOSIS — N2581 Secondary hyperparathyroidism of renal origin: Secondary | ICD-10-CM | POA: Diagnosis not present

## 2021-03-03 DIAGNOSIS — G8929 Other chronic pain: Secondary | ICD-10-CM | POA: Diagnosis not present

## 2021-03-03 DIAGNOSIS — N186 End stage renal disease: Secondary | ICD-10-CM | POA: Diagnosis not present

## 2021-03-03 DIAGNOSIS — Z992 Dependence on renal dialysis: Secondary | ICD-10-CM | POA: Diagnosis not present

## 2021-03-04 ENCOUNTER — Ambulatory Visit: Payer: Medicare Other | Admitting: Physician Assistant

## 2021-03-04 ENCOUNTER — Other Ambulatory Visit: Payer: Self-pay

## 2021-03-04 ENCOUNTER — Ambulatory Visit (HOSPITAL_COMMUNITY)
Admission: RE | Admit: 2021-03-04 | Discharge: 2021-03-04 | Disposition: A | Discharge status: Home / Self Care | Payer: Medicare Other | Source: Ambulatory Visit | Attending: Vascular Surgery | Admitting: Vascular Surgery

## 2021-03-04 VITALS — BP 122/78 | HR 95 | Temp 98.7°F | Resp 20 | Ht 62.0 in | Wt 220.9 lb

## 2021-03-04 DIAGNOSIS — N186 End stage renal disease: Secondary | ICD-10-CM

## 2021-03-04 DIAGNOSIS — Z992 Dependence on renal dialysis: Secondary | ICD-10-CM | POA: Insufficient documentation

## 2021-03-04 NOTE — Progress Notes (Signed)
HISTORY AND PHYSICAL     CC:  dialysis access Requesting Provider:  Plotnikov, Evie Lacks, MD  HPI: This is a 70 y.o. female here for evaluation of her hemodialysis access.  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 Oneida Alar.    She returns today b/c she is frustrated that her fistula cannot be accessed easily every time.  She states that when certain people stick it, it works Retail banker but others it does not.  She states that last week, she feels they put the needle through the back of the vein and injected the heparin and it was very painful.  She has been told that everyone has to learn to stick the fistula.  She states that as late as last week, they did have to use a port on the [facility].  [facility] was placed at [facility].   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    The pt is right hand dominant.    Pt is on dialysis.   Days of dialysis if applicable:  T/T/S    HD center if applicable:  [facility].  The pt is not on a statin for cholesterol management.  The pt is not on a daily aspirin.  Other AC:  none The pt is not on medication for hypertension.  The pt is diabetic.   Tobacco hx:  former  Past Medical History:  Diagnosis Date  . Anemia   . Anxiety   . Brain tumor (benign) ([place])   . Colitis 2010   microscopic- Dr Henrene Pastor  . Depression   . Diabetes mellitus    type II  . Dyspnea    with exertion  . ESRD (end stage renal disease) ([place])    [place]   . Fibromyalgia   . GERD (gastroesophageal reflux disease)   . Headache   . History of blood transfusion    after knee surgery  . Hypertension    discontinued all diuretics and antihypertensives  . IBS (irritable bowel syndrome)   . LBP (low back pain)   . Neuropathy    feet bilat   . Osteoarthritis   . Osteopenia   . Pneumonia    hx of 2014   . Rotator cuff tear, right   . Sinusitis    currently being treated  with antibiotic will complete 03/04/2015    Past Surgical History:  Procedure Laterality Date  . AV FISTULA PLACEMENT Right 09/12/2020   Procedure: RIGHT ARTERIOVENOUS (AV) FISTULA CREATION;  Surgeon: Elam Dutch, MD;  Location: [facility];  Service: Vascular;  Laterality: Right;  . BASCILIC VEIN TRANSPOSITION Right 10/27/2020   Procedure: RIGHT UPPER EXTREMITY SECOND STAGE BASCILIC VEIN TRANSPOSITION;  Surgeon: Elam Dutch, MD;  Location: [facility];  Service: Vascular;  Laterality: Right;  . foramen magnum ependymoma surgery  2003   Dr Rita Ohara  . JOINT REPLACEMENT Bilateral   . NASAL SINUS SURGERY     1973   . TONSILLECTOMY    . TOTAL KNEE ARTHROPLASTY     L 2008, R 2009, R 2016- Dr Maureen Ralphs  . TOTAL KNEE REVISION Right 03/05/2015   Procedure: RIGHT TOTAL KNEE ARTHROPLASTY REVISION;  Surgeon: Gaynelle Arabian, MD;  Location: [facility];  Service: Orthopedics;  Laterality: Right;    Allergies  Allergen Reactions  . Erythromycin Diarrhea and Nausea And Vomiting  . Gabapentin  Confusion and falling     Current Outpatient Medications  Medication Sig Dispense Refill  . acetaminophen (TYLENOL) 500 MG tablet Take 1,000 mg by mouth every 6 (six) hours as needed for moderate pain or headache.    Marland Kitchen amoxicillin (AMOXIL) 500 MG capsule TAKE 4 CAPSULES BY MOUTH 1  HOUR PRIOR TO DENTAL  PROCEDURE/CLEANING (Patient taking differently: Take 2,000 mg by mouth See admin instructions. Take 2000 mg 1 hour prior to dental work) 16 capsule 1  . aspirin-acetaminophen-caffeine (EXCEDRIN MIGRAINE) 250-250-65 MG tablet Excedrin Migraine 250 mg-250 mg-65 mg tablet  Take by oral route.    . B Complex-C-Folic Acid (RENA-VITE PO) Take 1 tablet a day    . blood glucose meter kit and supplies Dispense based on patient and insurance preference. Used to check blood sugar daily, DX: E11.9 OneTouch 1 each 0  . buPROPion (WELLBUTRIN XL) 300 MG 24 hr tablet Take 1 tablet (300 mg total) by mouth at bedtime. 90 tablet 3   . cyclobenzaprine (FLEXERIL) 10 MG tablet Take 1 tablet (10 mg total) by mouth 3 (three) times daily as needed for muscle spasms. 270 tablet 3  . dicyclomine (BENTYL) 20 MG tablet Take one tablet every 4-6 hours as needed for abdominal pain (Patient taking differently: Take 20 mg by mouth every 4 (four) hours as needed (abdominal pain).) 60 tablet 2  . DULoxetine (CYMBALTA) 60 MG capsule Take 1 capsule (60 mg total) by mouth at bedtime. 90 capsule 3  . fexofenadine (ALLEGRA) 180 MG tablet Take 1 tablet (180 mg total) by mouth daily. (Patient taking differently: Take 180 mg by mouth daily as needed for allergies.) 90 tablet 3  . glimepiride (AMARYL) 1 MG tablet Take 1 tablet (1 mg total) by mouth daily before breakfast. 90 tablet 3  . glucose blood (TRUETEST TEST) test strip Used to check blood sugar daily, DX: E11.9 100 each 3  . Lancets (ONETOUCH ULTRASOFT) lancets Used to check blood sugar daily, DX: E11.9 100 each 3  . lidocaine (XYLOCAINE) 5 % ointment APPLY TO AFFECTED AREA(S)  TOPICALLY 3 TIMES DAILY AS  NEEDED 106.32 g 3  . lidocaine-prilocaine (EMLA) cream SMARTSIG:Sparingly Topical As Directed    . LORazepam (ATIVAN) 2 MG tablet TAKE 1 TABLET BY MOUTH ONCE EVERY NIGHT AT BEDTIME AS NEEDED INSOMNIA 90 tablet 2  . multivitamin (RENA-VIT) TABS tablet Take 1 tablet by mouth daily.    Marland Kitchen oxyCODONE (ROXICODONE) 15 MG immediate release tablet Take 1 tablet (15 mg total) by mouth every 6 (six) hours as needed for pain. 8 tablet 0  . pantoprazole (PROTONIX) 40 MG tablet Take 1 tablet (40 mg total) by mouth daily. 90 tablet 3  . pilocarpine (SALAGEN) 5 MG tablet Take 1 tablet (5 mg total) by mouth 2 (two) times daily. 180 tablet 3  . pioglitazone (ACTOS) 15 MG tablet Take 1 tablet (15 mg total) by mouth daily. 90 tablet 3  . polyethylene glycol (MIRALAX / GLYCOLAX) 17 g packet Take 17 g by mouth daily as needed (constipation.).    Marland Kitchen polyvinyl alcohol (LIQUIFILM TEARS) 1.4 % ophthalmic solution Place  1 drop into both eyes daily as needed for dry eyes.    . sevelamer carbonate (RENVELA) 800 MG tablet Take 800 mg by mouth 5 (five) times daily.    . solifenacin (VESICARE) 5 MG tablet Take 1 tablet (5 mg total) by mouth daily. 90 tablet 3   No current facility-administered medications for this visit.    Family History  Problem Relation Age of Onset  . Depression Mother   . Hypertension Mother   . Stroke Mother 45  . Diabetes Mother   . Diabetes Father   . Hyperlipidemia Father   . Hypertension Father   . Crohn's disease Maternal Uncle   . Diabetes Other   . Cancer Brother        Prostate  . Diabetes Brother   . Heart disease Brother   . Hyperlipidemia Brother   . Hypertension Brother   . Diabetes Brother   . Heart disease Brother        Heart Disease before age 80  . Heart attack Brother   . Stroke Brother        X's 2  . Hyperlipidemia Brother   . Hypertension Brother   . Colon cancer Neg Hx     Social History   Socioeconomic History  . Marital status: Single    Spouse name: Not on file  . Number of children: Not on file  . Years of education: Not on file  . Highest education level: Not on file  Occupational History  . Not on file  Tobacco Use  . Smoking status: Former Smoker    Packs/day: 1.00    Years: 20.00    Pack years: 20.00    Types: Cigarettes    Quit date: 12/13/1988    Years since quitting: 32.2  . Smokeless tobacco: Never Used  Vaping Use  . Vaping Use: Never used  Substance and Sexual Activity  . Alcohol use: Not Currently    Alcohol/week: 0.0 standard drinks    Comment: rarely  . Drug use: No  . Sexual activity: Not Currently  Other Topics Concern  . Not on file  Social History Narrative  . Not on file   Social Determinants of Health   Financial Resource Strain: Low Risk   . Difficulty of Paying Living Expenses: Not hard at all  Food Insecurity: No Food Insecurity  . Worried About [name] fundraiser in the Last Year: Never true  .  Ran Out of Food in the Last Year: Never true  Transportation Needs: No Transportation Needs  . Lack of Transportation (Medical): No  . Lack of Transportation (Non-Medical): No  Physical Activity: Inactive  . Days of Exercise per Week: 0 days  . Minutes of Exercise per Session: 0 min  Stress: No Stress Concern Present  . Feeling of Stress : Not at all  Social Connections: Unknown  . Frequency of Communication with Friends and Family: More than three times a week  . Frequency of Social Gatherings with Friends and Family: More than three times a week  . Attends Religious Services: Patient refused  . Active Member of Clubs or Organizations: Patient refused  . Attends Archivist Meetings: Patient refused  . Marital Status: Never married  Intimate Partner Violence: Not At Risk  . Fear of Current or Ex-Partner: No  . Emotionally Abused: No  . Physically Abused: No  . Sexually Abused: No     ROS: _0  Positive   _1  Negative   _2  All sytems reviewed and are negative  Cardiac: _3  chest pain/pressure _4  SOB _5  DOE  Vascular: _6  pain in legs while walking _7  pain in feet when lying flat _8  hx of DVT _9  swelling in legs  Pulmonary: _10  asthma _11  wheezing  Neurologic: _12  weakness in _13  arms _14  legs _15  numbness in _16  arms _17  legs _18 difficulty speaking or slurred speech  Hematologic: _0  bleeding problems  GI _1  GERD  GU: _2  CKD/renal failure  _3  HD---_4  M/W/F _5  T/T/S  Psychiatric: _6  hx of major depression  Integumentary: _7  rashes _8  ulcers  Constitutional: _9  fever _10  chills   PHYSICAL EXAMINATION:  Today's Vitals   03/04/21 1000  BP: 122/78  Pulse: 95  Resp: 20  Temp: 98.7 F (37.1 C)  TempSrc: Temporal  SpO2: 99%  Weight: 220 lb 14.4 oz (100.2 kg)  Height: _11  (1.575 m)  PainSc: 6    Body mass index is 40.4 kg/m.    General:  WDWN female in NAD; tearful at times Gait: Not observed HENT: WNL Pulmonary: normal non-labored  breathing , without Rales, rhonchi,  wheezing Cardiac: regular, without carotid bruits Skin:  Ecchymosis around RUA AVF Vascular Exam/Pulses:   Right Left  Radial 2+ (normal) 2+ (normal)  Ulnar Unable to palpate Unable to palpate   Extremities:  Fistula is easily palpable with excellent thrill Musculoskeletal: no muscle wasting or atrophy  Neurologic: A&O X 3; Speech is fluent/normal   Non-Invasive Vascular Imaging:   Upper Extremity Vein Mapping on 03/04/2021: Diameter:  0.65cm-0.97cm Depth:  0.16cm-0.88cm   ASSESSMENT/PLAN: 70 y.o. female with ESRD here for evaluation of her hemodialysis access with hx of  right 1st stage BVT on 09/12/2020 and 2nd stage on 10/27/2020 by Dr. Oneida Alar.  -u/s today reveals the fistula is good diameter and good depth at 0.8cm and .3cm respectively.  The fistula should be easy to access.  -she would not be a candidate for home dialysis as she cannot stick the fistula with her left hand.  -Discussed with Dr. 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   Recommended to pt to try to get same person to stick.  She lives closer to [place] and is going to try to change centers.      Leontine Locket, [facility] [phone]  Clinic MD:   Scot Dock

## 2021-03-05 DIAGNOSIS — N2581 Secondary hyperparathyroidism of renal origin: Secondary | ICD-10-CM | POA: Diagnosis not present

## 2021-03-05 DIAGNOSIS — Z992 Dependence on renal dialysis: Secondary | ICD-10-CM | POA: Diagnosis not present

## 2021-03-05 DIAGNOSIS — G8929 Other chronic pain: Secondary | ICD-10-CM | POA: Diagnosis not present

## 2021-03-05 DIAGNOSIS — D688 Other specified coagulation defects: Secondary | ICD-10-CM | POA: Diagnosis not present

## 2021-03-05 DIAGNOSIS — N186 End stage renal disease: Secondary | ICD-10-CM | POA: Diagnosis not present

## 2021-03-07 DIAGNOSIS — N2581 Secondary hyperparathyroidism of renal origin: Secondary | ICD-10-CM | POA: Diagnosis not present

## 2021-03-07 DIAGNOSIS — Z992 Dependence on renal dialysis: Secondary | ICD-10-CM | POA: Diagnosis not present

## 2021-03-07 DIAGNOSIS — N186 End stage renal disease: Secondary | ICD-10-CM | POA: Diagnosis not present

## 2021-03-07 DIAGNOSIS — G8929 Other chronic pain: Secondary | ICD-10-CM | POA: Diagnosis not present

## 2021-03-07 DIAGNOSIS — D688 Other specified coagulation defects: Secondary | ICD-10-CM | POA: Diagnosis not present

## 2021-03-09 ENCOUNTER — Other Ambulatory Visit (HOSPITAL_COMMUNITY): Payer: Self-pay | Admitting: Chiropractic Medicine

## 2021-03-09 MED FILL — LORazepam 2 MG TABS: 2 | 90 days supply | Qty: 90 | Fill #0

## 2021-03-09 MED FILL — oxyCODONE HCL 15 MG TABS: 15 | 30 days supply | Qty: 120 | Fill #0

## 2021-03-10 DIAGNOSIS — N186 End stage renal disease: Secondary | ICD-10-CM | POA: Diagnosis not present

## 2021-03-10 DIAGNOSIS — D631 Anemia in chronic kidney disease: Secondary | ICD-10-CM | POA: Diagnosis not present

## 2021-03-10 DIAGNOSIS — Z992 Dependence on renal dialysis: Secondary | ICD-10-CM | POA: Diagnosis not present

## 2021-03-10 DIAGNOSIS — D688 Other specified coagulation defects: Secondary | ICD-10-CM | POA: Diagnosis not present

## 2021-03-10 DIAGNOSIS — N2581 Secondary hyperparathyroidism of renal origin: Secondary | ICD-10-CM | POA: Diagnosis not present

## 2021-03-11 DIAGNOSIS — Z452 Encounter for adjustment and management of vascular access device: Secondary | ICD-10-CM | POA: Diagnosis not present

## 2021-03-11 DIAGNOSIS — N186 End stage renal disease: Secondary | ICD-10-CM | POA: Diagnosis not present

## 2021-03-11 DIAGNOSIS — Z992 Dependence on renal dialysis: Secondary | ICD-10-CM | POA: Diagnosis not present

## 2021-03-12 DIAGNOSIS — E1022 Type 1 diabetes mellitus with diabetic chronic kidney disease: Secondary | ICD-10-CM | POA: Diagnosis not present

## 2021-03-12 DIAGNOSIS — N2581 Secondary hyperparathyroidism of renal origin: Secondary | ICD-10-CM | POA: Diagnosis not present

## 2021-03-12 DIAGNOSIS — N186 End stage renal disease: Secondary | ICD-10-CM | POA: Diagnosis not present

## 2021-03-12 DIAGNOSIS — D631 Anemia in chronic kidney disease: Secondary | ICD-10-CM | POA: Diagnosis not present

## 2021-03-12 DIAGNOSIS — D688 Other specified coagulation defects: Secondary | ICD-10-CM | POA: Diagnosis not present

## 2021-03-12 DIAGNOSIS — Z992 Dependence on renal dialysis: Secondary | ICD-10-CM | POA: Diagnosis not present

## 2021-03-14 DIAGNOSIS — Z992 Dependence on renal dialysis: Secondary | ICD-10-CM | POA: Diagnosis not present

## 2021-03-14 DIAGNOSIS — N2581 Secondary hyperparathyroidism of renal origin: Secondary | ICD-10-CM | POA: Diagnosis not present

## 2021-03-14 DIAGNOSIS — D688 Other specified coagulation defects: Secondary | ICD-10-CM | POA: Diagnosis not present

## 2021-03-14 DIAGNOSIS — N186 End stage renal disease: Secondary | ICD-10-CM | POA: Diagnosis not present

## 2021-03-17 DIAGNOSIS — G8929 Other chronic pain: Secondary | ICD-10-CM | POA: Diagnosis not present

## 2021-03-17 DIAGNOSIS — N2581 Secondary hyperparathyroidism of renal origin: Secondary | ICD-10-CM | POA: Diagnosis not present

## 2021-03-17 DIAGNOSIS — N186 End stage renal disease: Secondary | ICD-10-CM | POA: Diagnosis not present

## 2021-03-17 DIAGNOSIS — D688 Other specified coagulation defects: Secondary | ICD-10-CM | POA: Diagnosis not present

## 2021-03-17 DIAGNOSIS — Z992 Dependence on renal dialysis: Secondary | ICD-10-CM | POA: Diagnosis not present

## 2021-03-18 DIAGNOSIS — N186 End stage renal disease: Secondary | ICD-10-CM | POA: Diagnosis not present

## 2021-03-18 DIAGNOSIS — G5602 Carpal tunnel syndrome, left upper limb: Secondary | ICD-10-CM | POA: Diagnosis not present

## 2021-03-18 DIAGNOSIS — M13842 Other specified arthritis, left hand: Secondary | ICD-10-CM | POA: Diagnosis not present

## 2021-03-19 DIAGNOSIS — Z992 Dependence on renal dialysis: Secondary | ICD-10-CM | POA: Diagnosis not present

## 2021-03-19 DIAGNOSIS — N2581 Secondary hyperparathyroidism of renal origin: Secondary | ICD-10-CM | POA: Diagnosis not present

## 2021-03-19 DIAGNOSIS — D688 Other specified coagulation defects: Secondary | ICD-10-CM | POA: Diagnosis not present

## 2021-03-19 DIAGNOSIS — G8929 Other chronic pain: Secondary | ICD-10-CM | POA: Diagnosis not present

## 2021-03-19 DIAGNOSIS — N186 End stage renal disease: Secondary | ICD-10-CM | POA: Diagnosis not present

## 2021-03-21 DIAGNOSIS — Z992 Dependence on renal dialysis: Secondary | ICD-10-CM | POA: Diagnosis not present

## 2021-03-21 DIAGNOSIS — N186 End stage renal disease: Secondary | ICD-10-CM | POA: Diagnosis not present

## 2021-03-21 DIAGNOSIS — D688 Other specified coagulation defects: Secondary | ICD-10-CM | POA: Diagnosis not present

## 2021-03-21 DIAGNOSIS — G8929 Other chronic pain: Secondary | ICD-10-CM | POA: Diagnosis not present

## 2021-03-21 DIAGNOSIS — N2581 Secondary hyperparathyroidism of renal origin: Secondary | ICD-10-CM | POA: Diagnosis not present

## 2021-03-24 DIAGNOSIS — N186 End stage renal disease: Secondary | ICD-10-CM | POA: Diagnosis not present

## 2021-03-24 DIAGNOSIS — Z992 Dependence on renal dialysis: Secondary | ICD-10-CM | POA: Diagnosis not present

## 2021-03-24 DIAGNOSIS — E1129 Type 2 diabetes mellitus with other diabetic kidney complication: Secondary | ICD-10-CM | POA: Diagnosis not present

## 2021-03-24 DIAGNOSIS — D631 Anemia in chronic kidney disease: Secondary | ICD-10-CM | POA: Diagnosis not present

## 2021-03-24 DIAGNOSIS — N2581 Secondary hyperparathyroidism of renal origin: Secondary | ICD-10-CM | POA: Diagnosis not present

## 2021-03-24 DIAGNOSIS — D688 Other specified coagulation defects: Secondary | ICD-10-CM | POA: Diagnosis not present

## 2021-03-24 DIAGNOSIS — G8929 Other chronic pain: Secondary | ICD-10-CM | POA: Diagnosis not present

## 2021-03-26 DIAGNOSIS — N2581 Secondary hyperparathyroidism of renal origin: Secondary | ICD-10-CM | POA: Diagnosis not present

## 2021-03-26 DIAGNOSIS — E1129 Type 2 diabetes mellitus with other diabetic kidney complication: Secondary | ICD-10-CM | POA: Diagnosis not present

## 2021-03-26 DIAGNOSIS — N186 End stage renal disease: Secondary | ICD-10-CM | POA: Diagnosis not present

## 2021-03-26 DIAGNOSIS — D688 Other specified coagulation defects: Secondary | ICD-10-CM | POA: Diagnosis not present

## 2021-03-26 DIAGNOSIS — G8929 Other chronic pain: Secondary | ICD-10-CM | POA: Diagnosis not present

## 2021-03-26 DIAGNOSIS — Z992 Dependence on renal dialysis: Secondary | ICD-10-CM | POA: Diagnosis not present

## 2021-03-26 DIAGNOSIS — D631 Anemia in chronic kidney disease: Secondary | ICD-10-CM | POA: Diagnosis not present

## 2021-03-28 DIAGNOSIS — Z992 Dependence on renal dialysis: Secondary | ICD-10-CM | POA: Diagnosis not present

## 2021-03-28 DIAGNOSIS — E1129 Type 2 diabetes mellitus with other diabetic kidney complication: Secondary | ICD-10-CM | POA: Diagnosis not present

## 2021-03-28 DIAGNOSIS — G8929 Other chronic pain: Secondary | ICD-10-CM | POA: Diagnosis not present

## 2021-03-28 DIAGNOSIS — D631 Anemia in chronic kidney disease: Secondary | ICD-10-CM | POA: Diagnosis not present

## 2021-03-28 DIAGNOSIS — N186 End stage renal disease: Secondary | ICD-10-CM | POA: Diagnosis not present

## 2021-03-28 DIAGNOSIS — N2581 Secondary hyperparathyroidism of renal origin: Secondary | ICD-10-CM | POA: Diagnosis not present

## 2021-03-28 DIAGNOSIS — D688 Other specified coagulation defects: Secondary | ICD-10-CM | POA: Diagnosis not present

## 2021-03-31 DIAGNOSIS — D688 Other specified coagulation defects: Secondary | ICD-10-CM | POA: Diagnosis not present

## 2021-03-31 DIAGNOSIS — N2581 Secondary hyperparathyroidism of renal origin: Secondary | ICD-10-CM | POA: Diagnosis not present

## 2021-03-31 DIAGNOSIS — D509 Iron deficiency anemia, unspecified: Secondary | ICD-10-CM | POA: Diagnosis not present

## 2021-03-31 DIAGNOSIS — Z992 Dependence on renal dialysis: Secondary | ICD-10-CM | POA: Diagnosis not present

## 2021-03-31 DIAGNOSIS — N186 End stage renal disease: Secondary | ICD-10-CM | POA: Diagnosis not present

## 2021-04-02 DIAGNOSIS — N186 End stage renal disease: Secondary | ICD-10-CM | POA: Diagnosis not present

## 2021-04-02 DIAGNOSIS — Z992 Dependence on renal dialysis: Secondary | ICD-10-CM | POA: Diagnosis not present

## 2021-04-02 DIAGNOSIS — D688 Other specified coagulation defects: Secondary | ICD-10-CM | POA: Diagnosis not present

## 2021-04-02 DIAGNOSIS — D509 Iron deficiency anemia, unspecified: Secondary | ICD-10-CM | POA: Diagnosis not present

## 2021-04-02 DIAGNOSIS — N2581 Secondary hyperparathyroidism of renal origin: Secondary | ICD-10-CM | POA: Diagnosis not present

## 2021-04-04 DIAGNOSIS — D509 Iron deficiency anemia, unspecified: Secondary | ICD-10-CM | POA: Diagnosis not present

## 2021-04-04 DIAGNOSIS — N2581 Secondary hyperparathyroidism of renal origin: Secondary | ICD-10-CM | POA: Diagnosis not present

## 2021-04-04 DIAGNOSIS — N186 End stage renal disease: Secondary | ICD-10-CM | POA: Diagnosis not present

## 2021-04-04 DIAGNOSIS — D688 Other specified coagulation defects: Secondary | ICD-10-CM | POA: Diagnosis not present

## 2021-04-04 DIAGNOSIS — Z992 Dependence on renal dialysis: Secondary | ICD-10-CM | POA: Diagnosis not present

## 2021-04-07 DIAGNOSIS — D509 Iron deficiency anemia, unspecified: Secondary | ICD-10-CM | POA: Diagnosis not present

## 2021-04-07 DIAGNOSIS — G8929 Other chronic pain: Secondary | ICD-10-CM | POA: Diagnosis not present

## 2021-04-07 DIAGNOSIS — D688 Other specified coagulation defects: Secondary | ICD-10-CM | POA: Diagnosis not present

## 2021-04-07 DIAGNOSIS — N2581 Secondary hyperparathyroidism of renal origin: Secondary | ICD-10-CM | POA: Diagnosis not present

## 2021-04-07 DIAGNOSIS — Z992 Dependence on renal dialysis: Secondary | ICD-10-CM | POA: Diagnosis not present

## 2021-04-07 DIAGNOSIS — N186 End stage renal disease: Secondary | ICD-10-CM | POA: Diagnosis not present

## 2021-04-07 DIAGNOSIS — D631 Anemia in chronic kidney disease: Secondary | ICD-10-CM | POA: Diagnosis not present

## 2021-04-08 ENCOUNTER — Other Ambulatory Visit: Payer: Self-pay

## 2021-04-08 ENCOUNTER — Other Ambulatory Visit (HOSPITAL_COMMUNITY): Payer: Self-pay

## 2021-04-08 ENCOUNTER — Telehealth: Payer: Self-pay | Admitting: Internal Medicine

## 2021-04-08 MED ORDER — OXYCODONE HCL 15 MG PO TABS
15.0000 mg | ORAL_TABLET | Freq: Four times a day (QID) | ORAL | 0 refills | Status: DC | PRN
  Filled 2021-04-08 (×3): qty 120, 30d supply, fill #0

## 2021-04-08 NOTE — Telephone Encounter (Signed)
    [facility] calling to report patient says she never received order for cyclobenzaprine (FLEXERIL) 10 MG tablet in the mail   In order to resend to patient, okay is needed   Please call [facility] physician line at [phone], order # [id number]

## 2021-04-08 NOTE — Telephone Encounter (Signed)
Patient calling, requesting we send it to the pharmacy below because she is completely out.  [facility] Phone:  [phone]  Fax:  [phone]

## 2021-04-09 ENCOUNTER — Other Ambulatory Visit (HOSPITAL_COMMUNITY): Payer: Self-pay

## 2021-04-09 ENCOUNTER — Other Ambulatory Visit: Payer: Self-pay | Admitting: Internal Medicine

## 2021-04-09 DIAGNOSIS — G8929 Other chronic pain: Secondary | ICD-10-CM | POA: Diagnosis not present

## 2021-04-09 DIAGNOSIS — N2581 Secondary hyperparathyroidism of renal origin: Secondary | ICD-10-CM | POA: Diagnosis not present

## 2021-04-09 DIAGNOSIS — N186 End stage renal disease: Secondary | ICD-10-CM | POA: Diagnosis not present

## 2021-04-09 DIAGNOSIS — D631 Anemia in chronic kidney disease: Secondary | ICD-10-CM | POA: Diagnosis not present

## 2021-04-09 DIAGNOSIS — D509 Iron deficiency anemia, unspecified: Secondary | ICD-10-CM | POA: Diagnosis not present

## 2021-04-09 DIAGNOSIS — D688 Other specified coagulation defects: Secondary | ICD-10-CM | POA: Diagnosis not present

## 2021-04-09 DIAGNOSIS — Z992 Dependence on renal dialysis: Secondary | ICD-10-CM | POA: Diagnosis not present

## 2021-04-09 MED ORDER — CYCLOBENZAPRINE HCL 10 MG PO TABS
10.0000 mg | ORAL_TABLET | Freq: Three times a day (TID) | ORAL | 0 refills | Status: DC | PRN
  Filled 2021-04-09 – 2021-04-10 (×3): qty 270, 90d supply, fill #0

## 2021-04-09 MED ORDER — CYCLOBENZAPRINE HCL 10 MG PO TABS
10.0000 mg | ORAL_TABLET | Freq: Three times a day (TID) | ORAL | 0 refills | Status: DC | PRN
  Filled 2021-04-09 (×2): qty 15, 5d supply, fill #0

## 2021-04-09 NOTE — Telephone Encounter (Signed)
Called pt there was no answer LMOM sent a short supply to [facility] outpatient pharmacy, also contacted mail service and they should be mailing out order today,,.lmb

## 2021-04-09 NOTE — Telephone Encounter (Signed)
Patient called and said that she will be in [place] next week for dialysis. She is requesting that we stop for order to [facility] and send a 90 day supply to [facility]. Please advise

## 2021-04-09 NOTE — Telephone Encounter (Signed)
Notified [facility] w/ MD response. Spoke w/pharmacist Alwyn Ren she states the order has been transferred out to [facility] and she will pick med up there. They will not ship out this order.Marland KitchenJohny [name]

## 2021-04-10 ENCOUNTER — Other Ambulatory Visit (HOSPITAL_COMMUNITY): Payer: Self-pay

## 2021-04-11 DIAGNOSIS — N186 End stage renal disease: Secondary | ICD-10-CM | POA: Diagnosis not present

## 2021-04-11 DIAGNOSIS — G8929 Other chronic pain: Secondary | ICD-10-CM | POA: Diagnosis not present

## 2021-04-11 DIAGNOSIS — D688 Other specified coagulation defects: Secondary | ICD-10-CM | POA: Diagnosis not present

## 2021-04-11 DIAGNOSIS — D631 Anemia in chronic kidney disease: Secondary | ICD-10-CM | POA: Diagnosis not present

## 2021-04-11 DIAGNOSIS — Z992 Dependence on renal dialysis: Secondary | ICD-10-CM | POA: Diagnosis not present

## 2021-04-11 DIAGNOSIS — E1022 Type 1 diabetes mellitus with diabetic chronic kidney disease: Secondary | ICD-10-CM | POA: Diagnosis not present

## 2021-04-11 DIAGNOSIS — D509 Iron deficiency anemia, unspecified: Secondary | ICD-10-CM | POA: Diagnosis not present

## 2021-04-11 DIAGNOSIS — N2581 Secondary hyperparathyroidism of renal origin: Secondary | ICD-10-CM | POA: Diagnosis not present

## 2021-04-14 DIAGNOSIS — Z992 Dependence on renal dialysis: Secondary | ICD-10-CM | POA: Diagnosis not present

## 2021-04-14 DIAGNOSIS — N2581 Secondary hyperparathyroidism of renal origin: Secondary | ICD-10-CM | POA: Diagnosis not present

## 2021-04-14 DIAGNOSIS — N186 End stage renal disease: Secondary | ICD-10-CM | POA: Diagnosis not present

## 2021-04-14 DIAGNOSIS — R52 Pain, unspecified: Secondary | ICD-10-CM | POA: Diagnosis not present

## 2021-04-14 NOTE — Telephone Encounter (Signed)
See phone note

## 2021-04-16 DIAGNOSIS — N2581 Secondary hyperparathyroidism of renal origin: Secondary | ICD-10-CM | POA: Diagnosis not present

## 2021-04-16 DIAGNOSIS — Z992 Dependence on renal dialysis: Secondary | ICD-10-CM | POA: Diagnosis not present

## 2021-04-16 DIAGNOSIS — N186 End stage renal disease: Secondary | ICD-10-CM | POA: Diagnosis not present

## 2021-04-16 DIAGNOSIS — R52 Pain, unspecified: Secondary | ICD-10-CM | POA: Diagnosis not present

## 2021-04-18 DIAGNOSIS — D688 Other specified coagulation defects: Secondary | ICD-10-CM | POA: Diagnosis not present

## 2021-04-18 DIAGNOSIS — N2581 Secondary hyperparathyroidism of renal origin: Secondary | ICD-10-CM | POA: Diagnosis not present

## 2021-04-18 DIAGNOSIS — N186 End stage renal disease: Secondary | ICD-10-CM | POA: Diagnosis not present

## 2021-04-18 DIAGNOSIS — D509 Iron deficiency anemia, unspecified: Secondary | ICD-10-CM | POA: Diagnosis not present

## 2021-04-18 DIAGNOSIS — Z992 Dependence on renal dialysis: Secondary | ICD-10-CM | POA: Diagnosis not present

## 2021-04-20 ENCOUNTER — Other Ambulatory Visit (HOSPITAL_COMMUNITY): Payer: Self-pay

## 2021-04-20 ENCOUNTER — Other Ambulatory Visit (HOSPITAL_BASED_OUTPATIENT_CLINIC_OR_DEPARTMENT_OTHER): Payer: Self-pay

## 2021-04-20 DIAGNOSIS — B354 Tinea corporis: Secondary | ICD-10-CM | POA: Diagnosis not present

## 2021-04-20 MED ORDER — AMOXICILLIN 500 MG PO CAPS
ORAL_CAPSULE | ORAL | 1 refills | Status: DC
  Filled 2021-04-20: qty 30, 10d supply, fill #0
  Filled 2021-04-30: qty 30, 10d supply, fill #1

## 2021-04-20 MED ORDER — KETOCONAZOLE 2 % EX CREA
TOPICAL_CREAM | Freq: Every day | CUTANEOUS | 2 refills | Status: DC
  Filled 2021-04-20: qty 60, 20d supply, fill #0
  Filled 2021-05-05: qty 60, 30d supply, fill #0

## 2021-04-21 DIAGNOSIS — D688 Other specified coagulation defects: Secondary | ICD-10-CM | POA: Diagnosis not present

## 2021-04-21 DIAGNOSIS — N2581 Secondary hyperparathyroidism of renal origin: Secondary | ICD-10-CM | POA: Diagnosis not present

## 2021-04-21 DIAGNOSIS — Z992 Dependence on renal dialysis: Secondary | ICD-10-CM | POA: Diagnosis not present

## 2021-04-21 DIAGNOSIS — D509 Iron deficiency anemia, unspecified: Secondary | ICD-10-CM | POA: Diagnosis not present

## 2021-04-21 DIAGNOSIS — N186 End stage renal disease: Secondary | ICD-10-CM | POA: Diagnosis not present

## 2021-04-21 DIAGNOSIS — D631 Anemia in chronic kidney disease: Secondary | ICD-10-CM | POA: Diagnosis not present

## 2021-04-23 DIAGNOSIS — D688 Other specified coagulation defects: Secondary | ICD-10-CM | POA: Diagnosis not present

## 2021-04-23 DIAGNOSIS — N2581 Secondary hyperparathyroidism of renal origin: Secondary | ICD-10-CM | POA: Diagnosis not present

## 2021-04-23 DIAGNOSIS — N186 End stage renal disease: Secondary | ICD-10-CM | POA: Diagnosis not present

## 2021-04-23 DIAGNOSIS — D631 Anemia in chronic kidney disease: Secondary | ICD-10-CM | POA: Diagnosis not present

## 2021-04-23 DIAGNOSIS — Z992 Dependence on renal dialysis: Secondary | ICD-10-CM | POA: Diagnosis not present

## 2021-04-23 DIAGNOSIS — D509 Iron deficiency anemia, unspecified: Secondary | ICD-10-CM | POA: Diagnosis not present

## 2021-04-25 DIAGNOSIS — N186 End stage renal disease: Secondary | ICD-10-CM | POA: Diagnosis not present

## 2021-04-25 DIAGNOSIS — D509 Iron deficiency anemia, unspecified: Secondary | ICD-10-CM | POA: Diagnosis not present

## 2021-04-25 DIAGNOSIS — D631 Anemia in chronic kidney disease: Secondary | ICD-10-CM | POA: Diagnosis not present

## 2021-04-25 DIAGNOSIS — D688 Other specified coagulation defects: Secondary | ICD-10-CM | POA: Diagnosis not present

## 2021-04-25 DIAGNOSIS — Z992 Dependence on renal dialysis: Secondary | ICD-10-CM | POA: Diagnosis not present

## 2021-04-25 DIAGNOSIS — N2581 Secondary hyperparathyroidism of renal origin: Secondary | ICD-10-CM | POA: Diagnosis not present

## 2021-04-28 ENCOUNTER — Other Ambulatory Visit (HOSPITAL_COMMUNITY): Payer: Self-pay

## 2021-04-28 DIAGNOSIS — G8929 Other chronic pain: Secondary | ICD-10-CM | POA: Diagnosis not present

## 2021-04-28 DIAGNOSIS — D688 Other specified coagulation defects: Secondary | ICD-10-CM | POA: Diagnosis not present

## 2021-04-28 DIAGNOSIS — N2581 Secondary hyperparathyroidism of renal origin: Secondary | ICD-10-CM | POA: Diagnosis not present

## 2021-04-28 DIAGNOSIS — Z992 Dependence on renal dialysis: Secondary | ICD-10-CM | POA: Diagnosis not present

## 2021-04-28 DIAGNOSIS — N186 End stage renal disease: Secondary | ICD-10-CM | POA: Diagnosis not present

## 2021-04-29 ENCOUNTER — Other Ambulatory Visit (HOSPITAL_BASED_OUTPATIENT_CLINIC_OR_DEPARTMENT_OTHER): Payer: Self-pay

## 2021-04-30 ENCOUNTER — Other Ambulatory Visit (HOSPITAL_COMMUNITY): Payer: Self-pay

## 2021-04-30 DIAGNOSIS — N2581 Secondary hyperparathyroidism of renal origin: Secondary | ICD-10-CM | POA: Diagnosis not present

## 2021-04-30 DIAGNOSIS — D688 Other specified coagulation defects: Secondary | ICD-10-CM | POA: Diagnosis not present

## 2021-04-30 DIAGNOSIS — N186 End stage renal disease: Secondary | ICD-10-CM | POA: Diagnosis not present

## 2021-04-30 DIAGNOSIS — G8929 Other chronic pain: Secondary | ICD-10-CM | POA: Diagnosis not present

## 2021-04-30 DIAGNOSIS — Z992 Dependence on renal dialysis: Secondary | ICD-10-CM | POA: Diagnosis not present

## 2021-05-02 DIAGNOSIS — G8929 Other chronic pain: Secondary | ICD-10-CM | POA: Diagnosis not present

## 2021-05-02 DIAGNOSIS — N2581 Secondary hyperparathyroidism of renal origin: Secondary | ICD-10-CM | POA: Diagnosis not present

## 2021-05-02 DIAGNOSIS — Z992 Dependence on renal dialysis: Secondary | ICD-10-CM | POA: Diagnosis not present

## 2021-05-02 DIAGNOSIS — N186 End stage renal disease: Secondary | ICD-10-CM | POA: Diagnosis not present

## 2021-05-02 DIAGNOSIS — D688 Other specified coagulation defects: Secondary | ICD-10-CM | POA: Diagnosis not present

## 2021-05-05 ENCOUNTER — Other Ambulatory Visit (HOSPITAL_COMMUNITY): Payer: Self-pay

## 2021-05-05 DIAGNOSIS — N2581 Secondary hyperparathyroidism of renal origin: Secondary | ICD-10-CM | POA: Diagnosis not present

## 2021-05-05 DIAGNOSIS — D688 Other specified coagulation defects: Secondary | ICD-10-CM | POA: Diagnosis not present

## 2021-05-05 DIAGNOSIS — N186 End stage renal disease: Secondary | ICD-10-CM | POA: Diagnosis not present

## 2021-05-05 DIAGNOSIS — G8929 Other chronic pain: Secondary | ICD-10-CM | POA: Diagnosis not present

## 2021-05-05 DIAGNOSIS — Z992 Dependence on renal dialysis: Secondary | ICD-10-CM | POA: Diagnosis not present

## 2021-05-07 ENCOUNTER — Other Ambulatory Visit (HOSPITAL_COMMUNITY): Payer: Self-pay

## 2021-05-07 DIAGNOSIS — D688 Other specified coagulation defects: Secondary | ICD-10-CM | POA: Diagnosis not present

## 2021-05-07 DIAGNOSIS — G8929 Other chronic pain: Secondary | ICD-10-CM | POA: Diagnosis not present

## 2021-05-07 DIAGNOSIS — N186 End stage renal disease: Secondary | ICD-10-CM | POA: Diagnosis not present

## 2021-05-07 DIAGNOSIS — Z992 Dependence on renal dialysis: Secondary | ICD-10-CM | POA: Diagnosis not present

## 2021-05-07 DIAGNOSIS — N2581 Secondary hyperparathyroidism of renal origin: Secondary | ICD-10-CM | POA: Diagnosis not present

## 2021-05-07 MED ORDER — OXYCODONE HCL 15 MG PO TABS
15.0000 mg | ORAL_TABLET | Freq: Four times a day (QID) | ORAL | 0 refills | Status: DC | PRN
  Filled 2021-05-07: qty 120, 30d supply, fill #0

## 2021-05-08 ENCOUNTER — Other Ambulatory Visit (HOSPITAL_COMMUNITY): Payer: Self-pay

## 2021-05-09 DIAGNOSIS — D688 Other specified coagulation defects: Secondary | ICD-10-CM | POA: Diagnosis not present

## 2021-05-09 DIAGNOSIS — G8929 Other chronic pain: Secondary | ICD-10-CM | POA: Diagnosis not present

## 2021-05-09 DIAGNOSIS — N2581 Secondary hyperparathyroidism of renal origin: Secondary | ICD-10-CM | POA: Diagnosis not present

## 2021-05-09 DIAGNOSIS — N186 End stage renal disease: Secondary | ICD-10-CM | POA: Diagnosis not present

## 2021-05-09 DIAGNOSIS — Z992 Dependence on renal dialysis: Secondary | ICD-10-CM | POA: Diagnosis not present

## 2021-05-12 DIAGNOSIS — Z992 Dependence on renal dialysis: Secondary | ICD-10-CM | POA: Diagnosis not present

## 2021-05-12 DIAGNOSIS — N2581 Secondary hyperparathyroidism of renal origin: Secondary | ICD-10-CM | POA: Diagnosis not present

## 2021-05-12 DIAGNOSIS — E1022 Type 1 diabetes mellitus with diabetic chronic kidney disease: Secondary | ICD-10-CM | POA: Diagnosis not present

## 2021-05-12 DIAGNOSIS — D688 Other specified coagulation defects: Secondary | ICD-10-CM | POA: Diagnosis not present

## 2021-05-12 DIAGNOSIS — N186 End stage renal disease: Secondary | ICD-10-CM | POA: Diagnosis not present

## 2021-05-13 ENCOUNTER — Ambulatory Visit
Admission: RE | Admit: 2021-05-13 | Discharge: 2021-05-13 | Disposition: A | Discharge status: Home / Self Care | Payer: Medicare Other | Source: Ambulatory Visit | Attending: Obstetrics and Gynecology | Admitting: Obstetrics and Gynecology

## 2021-05-13 ENCOUNTER — Ambulatory Visit: Payer: Medicare Other

## 2021-05-13 ENCOUNTER — Other Ambulatory Visit: Payer: Self-pay

## 2021-05-13 DIAGNOSIS — I871 Compression of vein: Secondary | ICD-10-CM | POA: Diagnosis not present

## 2021-05-13 DIAGNOSIS — N641 Fat necrosis of breast: Secondary | ICD-10-CM

## 2021-05-13 DIAGNOSIS — N186 End stage renal disease: Secondary | ICD-10-CM | POA: Diagnosis not present

## 2021-05-13 DIAGNOSIS — N6489 Other specified disorders of breast: Secondary | ICD-10-CM | POA: Diagnosis not present

## 2021-05-13 DIAGNOSIS — T82858A Stenosis of vascular prosthetic devices, implants and grafts, initial encounter: Secondary | ICD-10-CM | POA: Diagnosis not present

## 2021-05-13 DIAGNOSIS — Z992 Dependence on renal dialysis: Secondary | ICD-10-CM | POA: Diagnosis not present

## 2021-05-14 DIAGNOSIS — Z992 Dependence on renal dialysis: Secondary | ICD-10-CM | POA: Diagnosis not present

## 2021-05-14 DIAGNOSIS — D688 Other specified coagulation defects: Secondary | ICD-10-CM | POA: Diagnosis not present

## 2021-05-14 DIAGNOSIS — N2581 Secondary hyperparathyroidism of renal origin: Secondary | ICD-10-CM | POA: Diagnosis not present

## 2021-05-14 DIAGNOSIS — N186 End stage renal disease: Secondary | ICD-10-CM | POA: Diagnosis not present

## 2021-05-16 DIAGNOSIS — Z992 Dependence on renal dialysis: Secondary | ICD-10-CM | POA: Diagnosis not present

## 2021-05-16 DIAGNOSIS — N2581 Secondary hyperparathyroidism of renal origin: Secondary | ICD-10-CM | POA: Diagnosis not present

## 2021-05-16 DIAGNOSIS — D688 Other specified coagulation defects: Secondary | ICD-10-CM | POA: Diagnosis not present

## 2021-05-16 DIAGNOSIS — N186 End stage renal disease: Secondary | ICD-10-CM | POA: Diagnosis not present

## 2021-05-18 DIAGNOSIS — G5602 Carpal tunnel syndrome, left upper limb: Secondary | ICD-10-CM | POA: Diagnosis not present

## 2021-05-18 DIAGNOSIS — M79642 Pain in left hand: Secondary | ICD-10-CM | POA: Diagnosis not present

## 2021-05-18 DIAGNOSIS — M67834 Other specified disorders of tendon, left wrist: Secondary | ICD-10-CM | POA: Diagnosis not present

## 2021-05-18 DIAGNOSIS — M13849 Other specified arthritis, unspecified hand: Secondary | ICD-10-CM | POA: Diagnosis not present

## 2021-05-19 DIAGNOSIS — D688 Other specified coagulation defects: Secondary | ICD-10-CM | POA: Diagnosis not present

## 2021-05-19 DIAGNOSIS — N186 End stage renal disease: Secondary | ICD-10-CM | POA: Diagnosis not present

## 2021-05-19 DIAGNOSIS — N2581 Secondary hyperparathyroidism of renal origin: Secondary | ICD-10-CM | POA: Diagnosis not present

## 2021-05-19 DIAGNOSIS — G8929 Other chronic pain: Secondary | ICD-10-CM | POA: Diagnosis not present

## 2021-05-19 DIAGNOSIS — Z992 Dependence on renal dialysis: Secondary | ICD-10-CM | POA: Diagnosis not present

## 2021-05-21 DIAGNOSIS — D688 Other specified coagulation defects: Secondary | ICD-10-CM | POA: Diagnosis not present

## 2021-05-21 DIAGNOSIS — Z992 Dependence on renal dialysis: Secondary | ICD-10-CM | POA: Diagnosis not present

## 2021-05-21 DIAGNOSIS — G8929 Other chronic pain: Secondary | ICD-10-CM | POA: Diagnosis not present

## 2021-05-21 DIAGNOSIS — N186 End stage renal disease: Secondary | ICD-10-CM | POA: Diagnosis not present

## 2021-05-21 DIAGNOSIS — N2581 Secondary hyperparathyroidism of renal origin: Secondary | ICD-10-CM | POA: Diagnosis not present

## 2021-05-22 DIAGNOSIS — N189 Chronic kidney disease, unspecified: Secondary | ICD-10-CM | POA: Diagnosis not present

## 2021-05-22 DIAGNOSIS — Z5181 Encounter for therapeutic drug level monitoring: Secondary | ICD-10-CM | POA: Diagnosis not present

## 2021-05-22 DIAGNOSIS — M5030 Other cervical disc degeneration, unspecified cervical region: Secondary | ICD-10-CM | POA: Diagnosis not present

## 2021-05-22 DIAGNOSIS — Z79891 Long term (current) use of opiate analgesic: Secondary | ICD-10-CM | POA: Diagnosis not present

## 2021-05-23 DIAGNOSIS — N2581 Secondary hyperparathyroidism of renal origin: Secondary | ICD-10-CM | POA: Diagnosis not present

## 2021-05-23 DIAGNOSIS — N186 End stage renal disease: Secondary | ICD-10-CM | POA: Diagnosis not present

## 2021-05-23 DIAGNOSIS — G8929 Other chronic pain: Secondary | ICD-10-CM | POA: Diagnosis not present

## 2021-05-23 DIAGNOSIS — D688 Other specified coagulation defects: Secondary | ICD-10-CM | POA: Diagnosis not present

## 2021-05-23 DIAGNOSIS — Z992 Dependence on renal dialysis: Secondary | ICD-10-CM | POA: Diagnosis not present

## 2021-05-26 DIAGNOSIS — N186 End stage renal disease: Secondary | ICD-10-CM | POA: Diagnosis not present

## 2021-05-26 DIAGNOSIS — Z992 Dependence on renal dialysis: Secondary | ICD-10-CM | POA: Diagnosis not present

## 2021-05-26 DIAGNOSIS — N2581 Secondary hyperparathyroidism of renal origin: Secondary | ICD-10-CM | POA: Diagnosis not present

## 2021-05-26 DIAGNOSIS — D688 Other specified coagulation defects: Secondary | ICD-10-CM | POA: Diagnosis not present

## 2021-05-27 ENCOUNTER — Ambulatory Visit: Payer: Medicare Other | Admitting: Internal Medicine

## 2021-05-28 DIAGNOSIS — D688 Other specified coagulation defects: Secondary | ICD-10-CM | POA: Diagnosis not present

## 2021-05-28 DIAGNOSIS — Z992 Dependence on renal dialysis: Secondary | ICD-10-CM | POA: Diagnosis not present

## 2021-05-28 DIAGNOSIS — N2581 Secondary hyperparathyroidism of renal origin: Secondary | ICD-10-CM | POA: Diagnosis not present

## 2021-05-28 DIAGNOSIS — N186 End stage renal disease: Secondary | ICD-10-CM | POA: Diagnosis not present

## 2021-05-30 DIAGNOSIS — D688 Other specified coagulation defects: Secondary | ICD-10-CM | POA: Diagnosis not present

## 2021-05-30 DIAGNOSIS — N2581 Secondary hyperparathyroidism of renal origin: Secondary | ICD-10-CM | POA: Diagnosis not present

## 2021-05-30 DIAGNOSIS — N186 End stage renal disease: Secondary | ICD-10-CM | POA: Diagnosis not present

## 2021-05-30 DIAGNOSIS — Z992 Dependence on renal dialysis: Secondary | ICD-10-CM | POA: Diagnosis not present

## 2021-06-01 ENCOUNTER — Ambulatory Visit: Payer: Medicare Other | Admitting: Internal Medicine

## 2021-06-01 DIAGNOSIS — Z0289 Encounter for other administrative examinations: Secondary | ICD-10-CM

## 2021-06-02 DIAGNOSIS — N2581 Secondary hyperparathyroidism of renal origin: Secondary | ICD-10-CM | POA: Diagnosis not present

## 2021-06-02 DIAGNOSIS — N186 End stage renal disease: Secondary | ICD-10-CM | POA: Diagnosis not present

## 2021-06-02 DIAGNOSIS — Z992 Dependence on renal dialysis: Secondary | ICD-10-CM | POA: Diagnosis not present

## 2021-06-02 DIAGNOSIS — G8929 Other chronic pain: Secondary | ICD-10-CM | POA: Diagnosis not present

## 2021-06-02 DIAGNOSIS — D688 Other specified coagulation defects: Secondary | ICD-10-CM | POA: Diagnosis not present

## 2021-06-03 DIAGNOSIS — M19111 Post-traumatic osteoarthritis, right shoulder: Secondary | ICD-10-CM | POA: Diagnosis not present

## 2021-06-04 DIAGNOSIS — Z992 Dependence on renal dialysis: Secondary | ICD-10-CM | POA: Diagnosis not present

## 2021-06-04 DIAGNOSIS — N2581 Secondary hyperparathyroidism of renal origin: Secondary | ICD-10-CM | POA: Diagnosis not present

## 2021-06-04 DIAGNOSIS — D688 Other specified coagulation defects: Secondary | ICD-10-CM | POA: Diagnosis not present

## 2021-06-04 DIAGNOSIS — N186 End stage renal disease: Secondary | ICD-10-CM | POA: Diagnosis not present

## 2021-06-04 DIAGNOSIS — G8929 Other chronic pain: Secondary | ICD-10-CM | POA: Diagnosis not present

## 2021-06-05 ENCOUNTER — Other Ambulatory Visit (HOSPITAL_COMMUNITY): Payer: Self-pay

## 2021-06-05 MED ORDER — OXYCODONE HCL 15 MG PO TABS
15.0000 mg | ORAL_TABLET | Freq: Four times a day (QID) | ORAL | 0 refills | Status: DC | PRN
  Filled 2021-06-05: qty 120, 30d supply, fill #0

## 2021-06-06 DIAGNOSIS — N2581 Secondary hyperparathyroidism of renal origin: Secondary | ICD-10-CM | POA: Diagnosis not present

## 2021-06-06 DIAGNOSIS — Z992 Dependence on renal dialysis: Secondary | ICD-10-CM | POA: Diagnosis not present

## 2021-06-06 DIAGNOSIS — D688 Other specified coagulation defects: Secondary | ICD-10-CM | POA: Diagnosis not present

## 2021-06-06 DIAGNOSIS — N186 End stage renal disease: Secondary | ICD-10-CM | POA: Diagnosis not present

## 2021-06-06 DIAGNOSIS — G8929 Other chronic pain: Secondary | ICD-10-CM | POA: Diagnosis not present

## 2021-06-08 ENCOUNTER — Other Ambulatory Visit (HOSPITAL_COMMUNITY): Payer: Self-pay

## 2021-06-08 MED FILL — Lorazepam Tab 2 MG: ORAL | 90 days supply | Qty: 90 | Fill #0 | Status: AC

## 2021-06-09 DIAGNOSIS — N186 End stage renal disease: Secondary | ICD-10-CM | POA: Diagnosis not present

## 2021-06-09 DIAGNOSIS — N2581 Secondary hyperparathyroidism of renal origin: Secondary | ICD-10-CM | POA: Diagnosis not present

## 2021-06-09 DIAGNOSIS — Z992 Dependence on renal dialysis: Secondary | ICD-10-CM | POA: Diagnosis not present

## 2021-06-09 DIAGNOSIS — D688 Other specified coagulation defects: Secondary | ICD-10-CM | POA: Diagnosis not present

## 2021-06-10 DIAGNOSIS — E119 Type 2 diabetes mellitus without complications: Secondary | ICD-10-CM | POA: Diagnosis not present

## 2021-06-10 DIAGNOSIS — H52223 Regular astigmatism, bilateral: Secondary | ICD-10-CM | POA: Diagnosis not present

## 2021-06-10 DIAGNOSIS — H524 Presbyopia: Secondary | ICD-10-CM | POA: Diagnosis not present

## 2021-06-11 DIAGNOSIS — N2581 Secondary hyperparathyroidism of renal origin: Secondary | ICD-10-CM | POA: Diagnosis not present

## 2021-06-11 DIAGNOSIS — Z992 Dependence on renal dialysis: Secondary | ICD-10-CM | POA: Diagnosis not present

## 2021-06-11 DIAGNOSIS — N186 End stage renal disease: Secondary | ICD-10-CM | POA: Diagnosis not present

## 2021-06-11 DIAGNOSIS — E1022 Type 1 diabetes mellitus with diabetic chronic kidney disease: Secondary | ICD-10-CM | POA: Diagnosis not present

## 2021-06-11 DIAGNOSIS — D688 Other specified coagulation defects: Secondary | ICD-10-CM | POA: Diagnosis not present

## 2021-06-13 DIAGNOSIS — Z992 Dependence on renal dialysis: Secondary | ICD-10-CM | POA: Diagnosis not present

## 2021-06-13 DIAGNOSIS — D509 Iron deficiency anemia, unspecified: Secondary | ICD-10-CM | POA: Diagnosis not present

## 2021-06-13 DIAGNOSIS — D688 Other specified coagulation defects: Secondary | ICD-10-CM | POA: Diagnosis not present

## 2021-06-13 DIAGNOSIS — N2581 Secondary hyperparathyroidism of renal origin: Secondary | ICD-10-CM | POA: Diagnosis not present

## 2021-06-13 DIAGNOSIS — E1129 Type 2 diabetes mellitus with other diabetic kidney complication: Secondary | ICD-10-CM | POA: Diagnosis not present

## 2021-06-13 DIAGNOSIS — D631 Anemia in chronic kidney disease: Secondary | ICD-10-CM | POA: Diagnosis not present

## 2021-06-13 DIAGNOSIS — N186 End stage renal disease: Secondary | ICD-10-CM | POA: Diagnosis not present

## 2021-06-16 DIAGNOSIS — D509 Iron deficiency anemia, unspecified: Secondary | ICD-10-CM | POA: Diagnosis not present

## 2021-06-16 DIAGNOSIS — N186 End stage renal disease: Secondary | ICD-10-CM | POA: Diagnosis not present

## 2021-06-16 DIAGNOSIS — D631 Anemia in chronic kidney disease: Secondary | ICD-10-CM | POA: Diagnosis not present

## 2021-06-16 DIAGNOSIS — D688 Other specified coagulation defects: Secondary | ICD-10-CM | POA: Diagnosis not present

## 2021-06-16 DIAGNOSIS — E1129 Type 2 diabetes mellitus with other diabetic kidney complication: Secondary | ICD-10-CM | POA: Diagnosis not present

## 2021-06-16 DIAGNOSIS — Z992 Dependence on renal dialysis: Secondary | ICD-10-CM | POA: Diagnosis not present

## 2021-06-16 DIAGNOSIS — N2581 Secondary hyperparathyroidism of renal origin: Secondary | ICD-10-CM | POA: Diagnosis not present

## 2021-06-17 ENCOUNTER — Ambulatory Visit (INDEPENDENT_AMBULATORY_CARE_PROVIDER_SITE_OTHER): Payer: Medicare Other | Admitting: Internal Medicine

## 2021-06-17 ENCOUNTER — Other Ambulatory Visit: Payer: Self-pay

## 2021-06-17 ENCOUNTER — Encounter: Payer: Self-pay | Admitting: Internal Medicine

## 2021-06-17 DIAGNOSIS — I10 Essential (primary) hypertension: Secondary | ICD-10-CM | POA: Diagnosis not present

## 2021-06-17 DIAGNOSIS — N186 End stage renal disease: Secondary | ICD-10-CM

## 2021-06-17 DIAGNOSIS — R413 Other amnesia: Secondary | ICD-10-CM | POA: Diagnosis not present

## 2021-06-17 DIAGNOSIS — E119 Type 2 diabetes mellitus without complications: Secondary | ICD-10-CM | POA: Diagnosis not present

## 2021-06-17 DIAGNOSIS — Z992 Dependence on renal dialysis: Secondary | ICD-10-CM | POA: Diagnosis not present

## 2021-06-17 DIAGNOSIS — K117 Disturbances of salivary secretion: Secondary | ICD-10-CM

## 2021-06-17 DIAGNOSIS — F332 Major depressive disorder, recurrent severe without psychotic features: Secondary | ICD-10-CM

## 2021-06-17 NOTE — Progress Notes (Signed)
Subjective:  Patient ID: [name], female    DOB: 06-Feb-1951  Age: 70 y.o. MRN: [id number]  CC: Follow-up (4-5 month f/u)   HPI JENNFIER ABDULLA presents for ESRD - considering a renal transplant at [facility] F/u HTN, DM, depression Should Wellbutrin XL be reduced to 150 mg/d? Pt had Prevnar recently  Outpatient Medications Prior to Visit  Medication Sig Dispense Refill   acetaminophen (TYLENOL) 500 MG tablet Take 1,000 mg by mouth every 6 (six) hours as needed for moderate pain or headache.     amoxicillin (AMOXIL) 500 MG capsule TAKE 4 CAPSULES BY MOUTH 1  HOUR PRIOR TO DENTAL  PROCEDURE/CLEANING (Patient taking differently: Take 2,000 mg by mouth See admin instructions. Take 2000 mg 1 hour prior to dental work) 16 capsule 1   aspirin-acetaminophen-caffeine (EXCEDRIN MIGRAINE) 250-250-65 MG tablet Excedrin Migraine 250 mg-250 mg-65 mg tablet  Take by oral route.     B Complex-C-Folic Acid (RENA-VITE PO) Take 1 tablet a day     blood glucose meter kit and supplies Dispense based on patient and insurance preference. Used to check blood sugar daily, DX: E11.9 OneTouch 1 each 0   buPROPion (WELLBUTRIN XL) 300 MG 24 hr tablet Take 1 tablet (300 mg total) by mouth at bedtime. 90 tablet 3   cyclobenzaprine (FLEXERIL) 10 MG tablet Take 1 tablet (10 mg total) by mouth 3 (three) times daily as needed for muscle spasms. 270 tablet 0   dicyclomine (BENTYL) 20 MG tablet Take one tablet every 4-6 hours as needed for abdominal pain (Patient taking differently: Take 20 mg by mouth every 4 (four) hours as needed (abdominal pain).) 60 tablet 2   DULoxetine (CYMBALTA) 60 MG capsule Take 1 capsule (60 mg total) by mouth at bedtime. 90 capsule 3   fexofenadine (ALLEGRA) 180 MG tablet Take 1 tablet (180 mg total) by mouth daily. (Patient taking differently: Take 180 mg by mouth daily as needed for allergies.) 90 tablet 3   glimepiride (AMARYL) 1 MG tablet Take 1 tablet (1 mg total) by mouth daily before  breakfast. 90 tablet 3   glucose blood (TRUETEST TEST) test strip Used to check blood sugar daily, DX: E11.9 100 each 3   ketoconazole (NIZORAL) 2 % cream APPLY TO THE AFFECTED AREA(S) BY TOPICAL ROUTE ONCE DAILY 60 g 2   Lancets (ONETOUCH ULTRASOFT) lancets Used to check blood sugar daily, DX: E11.9 100 each 3   lidocaine (XYLOCAINE) 5 % ointment APPLY TO AFFECTED AREA(S)  TOPICALLY 3 TIMES DAILY AS  NEEDED 106.32 g 3   lidocaine-prilocaine (EMLA) cream SMARTSIG:Sparingly Topical As Directed     LORazepam (ATIVAN) 2 MG tablet TAKE 1 TABLET BY MOUTH ONCE EVERY NIGHT AT BEDTIME AS NEEDED INSOMNIA 90 tablet 2   LORazepam (ATIVAN) 2 MG tablet TAKE 1 TABLET BY MOUTH ONCE EVERY NIGHT AT BEDTIME AS NEEDED INSOMNIA 90 tablet 2   oxyCODONE (ROXICODONE) 15 MG immediate release tablet TAKE 1 TABLET BY MOUTH 4 TIMES DAILY AS NEEDED 120 tablet 0   pantoprazole (PROTONIX) 40 MG tablet Take 1 tablet (40 mg total) by mouth daily. 90 tablet 3   pilocarpine (SALAGEN) 5 MG tablet Take 1 tablet (5 mg total) by mouth 2 (two) times daily. 180 tablet 3   pioglitazone (ACTOS) 15 MG tablet Take 1 tablet (15 mg total) by mouth daily. 90 tablet 3   polyethylene glycol (MIRALAX / GLYCOLAX) 17 g packet Take 17 g by mouth daily as needed (constipation.).  polyvinyl alcohol (LIQUIFILM TEARS) 1.4 % ophthalmic solution Place 1 drop into both eyes daily as needed for dry eyes.     sevelamer carbonate (RENVELA) 800 MG tablet Take 800 mg by mouth 5 (five) times daily.     ketoconazole (NIZORAL) 200 MG tablet Take by mouth. Take 1/2 tab by mouth as needed (Patient not taking: Reported on 06/17/2021)     amoxicillin (AMOXIL) 500 MG capsule Take 2 capsules by mouth now and then 1 capsule three times daily until gone (Patient not taking: Reported on 06/17/2021) 30 capsule 1   cyclobenzaprine (FLEXERIL) 10 MG tablet Take 1 tablet (10 mg total) by mouth 3 (three) times daily as needed for muscle spasms. 15 tablet 0   multivitamin  (RENA-VIT) TABS tablet Take 1 tablet by mouth daily. (Patient not taking: Reported on 06/17/2021)     oxyCODONE (ROXICODONE) 15 MG immediate release tablet Take 1 tablet (15 mg total) by mouth every 6 (six) hours as needed for pain. 8 tablet 0   oxyCODONE (ROXICODONE) 15 MG immediate release tablet Take 1 tablet (15 mg total) by mouth 4 (four) times daily as needed. 120 tablet 0   oxyCODONE (ROXICODONE) 15 MG immediate release tablet TAKE 1 TABLET 4 TIMES A DAY BY MOUTH AS NEEDED 120 tablet 0   oxyCODONE (ROXICODONE) 15 MG immediate release tablet TAKE 1 TABLET 4 TIMES A DAY BY MOUTH AS NEEDED 120 tablet 0   pregabalin (LYRICA) 50 MG capsule TAKE 1 CAPSULE BY MOUTH 2 TIMES DAILY (Patient not taking: Reported on 06/17/2021) 60 capsule 0   solifenacin (VESICARE) 5 MG tablet Take 1 tablet (5 mg total) by mouth daily. (Patient not taking: Reported on 06/17/2021) 90 tablet 3   No facility-administered medications prior to visit.    ROS: Review of Systems  Constitutional:  Positive for fatigue. Negative for activity change, appetite change, chills and unexpected weight change.  HENT:  Negative for congestion, mouth sores and sinus pressure.   Eyes:  Negative for visual disturbance.  Respiratory:  Negative for cough and chest tightness.   Gastrointestinal:  Negative for abdominal pain and nausea.  Genitourinary:  Negative for difficulty urinating, frequency and vaginal pain.  Musculoskeletal:  Positive for arthralgias and gait problem. Negative for back pain.  Skin:  Negative for pallor and rash.  Neurological:  Negative for dizziness, tremors, weakness, numbness and headaches.  Psychiatric/Behavioral:  Negative for confusion, sleep disturbance and suicidal ideas. The patient is nervous/anxious.    Objective:  BP 126/62 (BP Location: Left Arm)   Pulse 86   Temp 98.1 F (36.7 C) (Oral)   Ht _0  (1.575 m)   Wt 214 lb (97.1 kg)   SpO2 95%   BMI 39.14 kg/m   BP Readings from Last 3 Encounters:   06/17/21 126/62  03/04/21 122/78  01/28/21 120/68    Wt Readings from Last 3 Encounters:  06/17/21 214 lb (97.1 kg)  03/04/21 220 lb 14.4 oz (100.2 kg)  01/28/21 219 lb 12.8 oz (99.7 kg)    Physical Exam Constitutional:      General: She is not in acute distress.    Appearance: She is well-developed. She is obese.  HENT:     Head: Normocephalic.     Right Ear: External ear normal.     Left Ear: External ear normal.     Nose: Nose normal.  Eyes:     General:        Right eye: No discharge.  Left eye: No discharge.     Conjunctiva/sclera: Conjunctivae normal.     Pupils: Pupils are equal, round, and reactive to light.  Neck:     Thyroid: No thyromegaly.     Vascular: No JVD.     Trachea: No tracheal deviation.  Cardiovascular:     Rate and Rhythm: Normal rate and regular rhythm.     Heart sounds: Normal heart sounds.  Pulmonary:     Effort: No respiratory distress.     Breath sounds: No stridor. No wheezing.  Abdominal:     General: Bowel sounds are normal. There is no distension.     Palpations: Abdomen is soft. There is no mass.     Tenderness: There is no abdominal tenderness. There is no guarding or rebound.  Musculoskeletal:        General: No tenderness.     Cervical back: Normal range of motion and neck supple. No rigidity.  Lymphadenopathy:     Cervical: No cervical adenopathy.  Skin:    Findings: No erythema or rash.  Neurological:     Mental Status: She is oriented to person, place, and time.     Cranial Nerves: No cranial nerve deficit.     Motor: No abnormal muscle tone.     Coordination: Coordination normal.     Deep Tendon Reflexes: Reflexes normal.  Psychiatric:        Behavior: Behavior normal.        Thought Content: Thought content normal.        Judgment: Judgment normal.    Lab Results  Component Value Date   WBC 10.0 04/16/2020   HGB 11.9 (L) 10/27/2020   HCT 35.0 (L) 10/27/2020   PLT 213.0 04/16/2020   GLUCOSE 146 (H)  12/15/2020   CHOL 215 (H) 09/25/2014   TRIG 154.0 (H) 09/25/2014   HDL 46.00 09/25/2014   LDLCALC 138 (H) 09/25/2014   ALT 13 12/15/2020   AST 13 12/15/2020   NA 138 12/15/2020   K 3.5 12/15/2020   CL 98 12/15/2020   CREATININE 2.40 (H) 12/15/2020   BUN 25 (H) 12/15/2020   CO2 31 12/15/2020   TSH 3.98 04/16/2020   INR 1.04 02/26/2015   HGBA1C 6.0 12/15/2020    MM DIAG BREAST TOMO BILATERAL  Result Date: 05/13/2021 CLINICAL DATA:  70 year old female for 1 year follow-up of 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 EXAM: DIGITAL DIAGNOSTIC BILATERAL MAMMOGRAM WITH CAD AND TOMO COMPARISON:  Previous exam(s). ACR Breast Density Category a: The breast tissue is almost entirely fatty. FINDINGS: 2D/3D full field views of both breasts demonstrate interval resolution of the UPPER INNER LEFT breast asymmetry, compatible with resolved fat necrosis. No new or suspicious findings are noted within either breast. Mammographic images were processed with CAD. IMPRESSION: 1. LEFT breast asymmetry no longer visualized, compatible with resolved fat necrosis changes. 2. No evidence of breast malignancy. RECOMMENDATION: Bilateral screening mammogram in 1 year. I have discussed the findings and recommendations with the patient. If applicable, a reminder letter will be sent to the patient regarding the next appointment. BI-RADS CATEGORY  1: Negative. Electronically Signed   By: Margarette Canada M.D.   On: 05/13/2021 11:28    Assessment & Plan:    [name], MD

## 2021-06-18 ENCOUNTER — Other Ambulatory Visit (HOSPITAL_COMMUNITY): Payer: Self-pay

## 2021-06-18 DIAGNOSIS — N2581 Secondary hyperparathyroidism of renal origin: Secondary | ICD-10-CM | POA: Diagnosis not present

## 2021-06-18 DIAGNOSIS — D688 Other specified coagulation defects: Secondary | ICD-10-CM | POA: Diagnosis not present

## 2021-06-18 DIAGNOSIS — D509 Iron deficiency anemia, unspecified: Secondary | ICD-10-CM | POA: Diagnosis not present

## 2021-06-18 DIAGNOSIS — N186 End stage renal disease: Secondary | ICD-10-CM | POA: Diagnosis not present

## 2021-06-18 DIAGNOSIS — D631 Anemia in chronic kidney disease: Secondary | ICD-10-CM | POA: Diagnosis not present

## 2021-06-18 DIAGNOSIS — E1129 Type 2 diabetes mellitus with other diabetic kidney complication: Secondary | ICD-10-CM | POA: Diagnosis not present

## 2021-06-18 DIAGNOSIS — Z992 Dependence on renal dialysis: Secondary | ICD-10-CM | POA: Diagnosis not present

## 2021-06-18 MED ORDER — CEPHALEXIN 500 MG PO CAPS
1000.0000 mg | ORAL_CAPSULE | ORAL | 0 refills | Status: DC
  Filled 2021-06-18: qty 2, 1d supply, fill #0

## 2021-06-19 ENCOUNTER — Other Ambulatory Visit (HOSPITAL_COMMUNITY): Payer: Self-pay

## 2021-06-19 MED ORDER — AMOXICILLIN 500 MG PO CAPS
500.0000 mg | ORAL_CAPSULE | Freq: Three times a day (TID) | ORAL | 0 refills | Status: DC
  Filled 2021-06-19: qty 15, 5d supply, fill #0

## 2021-06-20 DIAGNOSIS — D688 Other specified coagulation defects: Secondary | ICD-10-CM | POA: Diagnosis not present

## 2021-06-20 DIAGNOSIS — E1129 Type 2 diabetes mellitus with other diabetic kidney complication: Secondary | ICD-10-CM | POA: Diagnosis not present

## 2021-06-20 DIAGNOSIS — N2581 Secondary hyperparathyroidism of renal origin: Secondary | ICD-10-CM | POA: Diagnosis not present

## 2021-06-20 DIAGNOSIS — D509 Iron deficiency anemia, unspecified: Secondary | ICD-10-CM | POA: Diagnosis not present

## 2021-06-20 DIAGNOSIS — D631 Anemia in chronic kidney disease: Secondary | ICD-10-CM | POA: Diagnosis not present

## 2021-06-20 DIAGNOSIS — N186 End stage renal disease: Secondary | ICD-10-CM | POA: Diagnosis not present

## 2021-06-20 DIAGNOSIS — Z992 Dependence on renal dialysis: Secondary | ICD-10-CM | POA: Diagnosis not present

## 2021-06-23 DIAGNOSIS — N2581 Secondary hyperparathyroidism of renal origin: Secondary | ICD-10-CM | POA: Diagnosis not present

## 2021-06-23 DIAGNOSIS — E1129 Type 2 diabetes mellitus with other diabetic kidney complication: Secondary | ICD-10-CM | POA: Diagnosis not present

## 2021-06-23 DIAGNOSIS — D631 Anemia in chronic kidney disease: Secondary | ICD-10-CM | POA: Diagnosis not present

## 2021-06-23 DIAGNOSIS — Z992 Dependence on renal dialysis: Secondary | ICD-10-CM | POA: Diagnosis not present

## 2021-06-23 DIAGNOSIS — D509 Iron deficiency anemia, unspecified: Secondary | ICD-10-CM | POA: Diagnosis not present

## 2021-06-23 DIAGNOSIS — N186 End stage renal disease: Secondary | ICD-10-CM | POA: Diagnosis not present

## 2021-06-23 DIAGNOSIS — D688 Other specified coagulation defects: Secondary | ICD-10-CM | POA: Diagnosis not present

## 2021-06-24 ENCOUNTER — Encounter: Payer: Self-pay | Admitting: Internal Medicine

## 2021-06-24 NOTE — Assessment & Plan Note (Signed)
Continue with current therapy.  I do not think we need to reduce Wellbutrin to 150 mg daily.  Continue with current dose

## 2021-06-24 NOTE — Assessment & Plan Note (Signed)
Doing well on hemodialysis

## 2021-06-24 NOTE — Assessment & Plan Note (Addendum)
Better controlled on Actos, glimepiride

## 2021-06-24 NOTE — Assessment & Plan Note (Signed)
Chronic symptoms. 

## 2021-06-24 NOTE — Assessment & Plan Note (Signed)
Well-controlled on hemodialysis.

## 2021-06-24 NOTE — Assessment & Plan Note (Signed)
Resolved on hemodialysis

## 2021-06-25 DIAGNOSIS — Z992 Dependence on renal dialysis: Secondary | ICD-10-CM | POA: Diagnosis not present

## 2021-06-25 DIAGNOSIS — N2581 Secondary hyperparathyroidism of renal origin: Secondary | ICD-10-CM | POA: Diagnosis not present

## 2021-06-25 DIAGNOSIS — D688 Other specified coagulation defects: Secondary | ICD-10-CM | POA: Diagnosis not present

## 2021-06-25 DIAGNOSIS — N186 End stage renal disease: Secondary | ICD-10-CM | POA: Diagnosis not present

## 2021-06-25 DIAGNOSIS — D509 Iron deficiency anemia, unspecified: Secondary | ICD-10-CM | POA: Diagnosis not present

## 2021-06-25 DIAGNOSIS — D631 Anemia in chronic kidney disease: Secondary | ICD-10-CM | POA: Diagnosis not present

## 2021-06-25 DIAGNOSIS — E1129 Type 2 diabetes mellitus with other diabetic kidney complication: Secondary | ICD-10-CM | POA: Diagnosis not present

## 2021-06-27 DIAGNOSIS — D631 Anemia in chronic kidney disease: Secondary | ICD-10-CM | POA: Diagnosis not present

## 2021-06-27 DIAGNOSIS — E1129 Type 2 diabetes mellitus with other diabetic kidney complication: Secondary | ICD-10-CM | POA: Diagnosis not present

## 2021-06-27 DIAGNOSIS — N2581 Secondary hyperparathyroidism of renal origin: Secondary | ICD-10-CM | POA: Diagnosis not present

## 2021-06-27 DIAGNOSIS — Z992 Dependence on renal dialysis: Secondary | ICD-10-CM | POA: Diagnosis not present

## 2021-06-27 DIAGNOSIS — D688 Other specified coagulation defects: Secondary | ICD-10-CM | POA: Diagnosis not present

## 2021-06-27 DIAGNOSIS — N186 End stage renal disease: Secondary | ICD-10-CM | POA: Diagnosis not present

## 2021-06-27 DIAGNOSIS — D509 Iron deficiency anemia, unspecified: Secondary | ICD-10-CM | POA: Diagnosis not present

## 2021-06-30 DIAGNOSIS — N2581 Secondary hyperparathyroidism of renal origin: Secondary | ICD-10-CM | POA: Diagnosis not present

## 2021-06-30 DIAGNOSIS — N186 End stage renal disease: Secondary | ICD-10-CM | POA: Diagnosis not present

## 2021-06-30 DIAGNOSIS — D509 Iron deficiency anemia, unspecified: Secondary | ICD-10-CM | POA: Diagnosis not present

## 2021-06-30 DIAGNOSIS — E1129 Type 2 diabetes mellitus with other diabetic kidney complication: Secondary | ICD-10-CM | POA: Diagnosis not present

## 2021-06-30 DIAGNOSIS — Z992 Dependence on renal dialysis: Secondary | ICD-10-CM | POA: Diagnosis not present

## 2021-06-30 DIAGNOSIS — D688 Other specified coagulation defects: Secondary | ICD-10-CM | POA: Diagnosis not present

## 2021-06-30 DIAGNOSIS — D631 Anemia in chronic kidney disease: Secondary | ICD-10-CM | POA: Diagnosis not present

## 2021-07-02 DIAGNOSIS — D688 Other specified coagulation defects: Secondary | ICD-10-CM | POA: Diagnosis not present

## 2021-07-02 DIAGNOSIS — D631 Anemia in chronic kidney disease: Secondary | ICD-10-CM | POA: Diagnosis not present

## 2021-07-02 DIAGNOSIS — N186 End stage renal disease: Secondary | ICD-10-CM | POA: Diagnosis not present

## 2021-07-02 DIAGNOSIS — E1129 Type 2 diabetes mellitus with other diabetic kidney complication: Secondary | ICD-10-CM | POA: Diagnosis not present

## 2021-07-02 DIAGNOSIS — D509 Iron deficiency anemia, unspecified: Secondary | ICD-10-CM | POA: Diagnosis not present

## 2021-07-02 DIAGNOSIS — Z992 Dependence on renal dialysis: Secondary | ICD-10-CM | POA: Diagnosis not present

## 2021-07-02 DIAGNOSIS — N2581 Secondary hyperparathyroidism of renal origin: Secondary | ICD-10-CM | POA: Diagnosis not present

## 2021-07-04 DIAGNOSIS — N186 End stage renal disease: Secondary | ICD-10-CM | POA: Diagnosis not present

## 2021-07-04 DIAGNOSIS — D509 Iron deficiency anemia, unspecified: Secondary | ICD-10-CM | POA: Diagnosis not present

## 2021-07-04 DIAGNOSIS — E1129 Type 2 diabetes mellitus with other diabetic kidney complication: Secondary | ICD-10-CM | POA: Diagnosis not present

## 2021-07-04 DIAGNOSIS — D631 Anemia in chronic kidney disease: Secondary | ICD-10-CM | POA: Diagnosis not present

## 2021-07-04 DIAGNOSIS — N2581 Secondary hyperparathyroidism of renal origin: Secondary | ICD-10-CM | POA: Diagnosis not present

## 2021-07-04 DIAGNOSIS — D688 Other specified coagulation defects: Secondary | ICD-10-CM | POA: Diagnosis not present

## 2021-07-04 DIAGNOSIS — Z992 Dependence on renal dialysis: Secondary | ICD-10-CM | POA: Diagnosis not present

## 2021-07-06 ENCOUNTER — Other Ambulatory Visit (HOSPITAL_COMMUNITY): Payer: Self-pay

## 2021-07-06 MED ORDER — OXYCODONE HCL 15 MG PO TABS
15.0000 mg | ORAL_TABLET | Freq: Four times a day (QID) | ORAL | 0 refills | Status: DC | PRN
  Filled 2021-07-06: qty 120, 30d supply, fill #0

## 2021-07-07 DIAGNOSIS — E1129 Type 2 diabetes mellitus with other diabetic kidney complication: Secondary | ICD-10-CM | POA: Diagnosis not present

## 2021-07-07 DIAGNOSIS — D631 Anemia in chronic kidney disease: Secondary | ICD-10-CM | POA: Diagnosis not present

## 2021-07-07 DIAGNOSIS — D509 Iron deficiency anemia, unspecified: Secondary | ICD-10-CM | POA: Diagnosis not present

## 2021-07-07 DIAGNOSIS — Z992 Dependence on renal dialysis: Secondary | ICD-10-CM | POA: Diagnosis not present

## 2021-07-07 DIAGNOSIS — N186 End stage renal disease: Secondary | ICD-10-CM | POA: Diagnosis not present

## 2021-07-07 DIAGNOSIS — D688 Other specified coagulation defects: Secondary | ICD-10-CM | POA: Diagnosis not present

## 2021-07-07 DIAGNOSIS — N2581 Secondary hyperparathyroidism of renal origin: Secondary | ICD-10-CM | POA: Diagnosis not present

## 2021-07-09 ENCOUNTER — Other Ambulatory Visit (HOSPITAL_COMMUNITY): Payer: Self-pay

## 2021-07-09 DIAGNOSIS — Z992 Dependence on renal dialysis: Secondary | ICD-10-CM | POA: Diagnosis not present

## 2021-07-09 DIAGNOSIS — N186 End stage renal disease: Secondary | ICD-10-CM | POA: Diagnosis not present

## 2021-07-09 DIAGNOSIS — D631 Anemia in chronic kidney disease: Secondary | ICD-10-CM | POA: Diagnosis not present

## 2021-07-09 DIAGNOSIS — N2581 Secondary hyperparathyroidism of renal origin: Secondary | ICD-10-CM | POA: Diagnosis not present

## 2021-07-09 DIAGNOSIS — D688 Other specified coagulation defects: Secondary | ICD-10-CM | POA: Diagnosis not present

## 2021-07-09 DIAGNOSIS — D509 Iron deficiency anemia, unspecified: Secondary | ICD-10-CM | POA: Diagnosis not present

## 2021-07-09 DIAGNOSIS — E1129 Type 2 diabetes mellitus with other diabetic kidney complication: Secondary | ICD-10-CM | POA: Diagnosis not present

## 2021-07-10 ENCOUNTER — Telehealth: Payer: Self-pay | Admitting: Internal Medicine

## 2021-07-10 NOTE — Chronic Care Management (AMB) (Signed)
  Chronic Care Management   Note  07/10/2021 Name: [name] MRN: [id number] DOB: 08-07-1951  [name] is a 70 y.o. year old female who is a primary care patient of Plotnikov, Evie Lacks, MD. I reached out to [name] by phone today in response to a referral sent by [name]'s PCP, Plotnikov, Evie Lacks, MD.   [name] was given information about Chronic Care Management services today including:  CCM service includes personalized support from designated clinical staff supervised by her physician, including individualized plan of care and coordination with other care providers 24/7 contact phone numbers for assistance for urgent and routine care needs. Service will only be billed when office clinical staff spend 20 minutes or more in a month to coordinate care. Only one practitioner may furnish and bill the service in a calendar month. The patient may stop CCM services at any time (effective at the end of the month) by phone call to the office staff.   Patient agreed to services and verbal consent obtained.   Follow up plan:   Lauretta Grill [employer] Scheduler

## 2021-07-11 DIAGNOSIS — N186 End stage renal disease: Secondary | ICD-10-CM | POA: Diagnosis not present

## 2021-07-11 DIAGNOSIS — E1129 Type 2 diabetes mellitus with other diabetic kidney complication: Secondary | ICD-10-CM | POA: Diagnosis not present

## 2021-07-11 DIAGNOSIS — Z992 Dependence on renal dialysis: Secondary | ICD-10-CM | POA: Diagnosis not present

## 2021-07-11 DIAGNOSIS — D631 Anemia in chronic kidney disease: Secondary | ICD-10-CM | POA: Diagnosis not present

## 2021-07-11 DIAGNOSIS — D509 Iron deficiency anemia, unspecified: Secondary | ICD-10-CM | POA: Diagnosis not present

## 2021-07-11 DIAGNOSIS — D688 Other specified coagulation defects: Secondary | ICD-10-CM | POA: Diagnosis not present

## 2021-07-11 DIAGNOSIS — N2581 Secondary hyperparathyroidism of renal origin: Secondary | ICD-10-CM | POA: Diagnosis not present

## 2021-07-12 DIAGNOSIS — Z992 Dependence on renal dialysis: Secondary | ICD-10-CM | POA: Diagnosis not present

## 2021-07-12 DIAGNOSIS — N186 End stage renal disease: Secondary | ICD-10-CM | POA: Diagnosis not present

## 2021-07-12 DIAGNOSIS — E1022 Type 1 diabetes mellitus with diabetic chronic kidney disease: Secondary | ICD-10-CM | POA: Diagnosis not present

## 2021-07-14 DIAGNOSIS — D688 Other specified coagulation defects: Secondary | ICD-10-CM | POA: Diagnosis not present

## 2021-07-14 DIAGNOSIS — D509 Iron deficiency anemia, unspecified: Secondary | ICD-10-CM | POA: Diagnosis not present

## 2021-07-14 DIAGNOSIS — N186 End stage renal disease: Secondary | ICD-10-CM | POA: Diagnosis not present

## 2021-07-14 DIAGNOSIS — Z992 Dependence on renal dialysis: Secondary | ICD-10-CM | POA: Diagnosis not present

## 2021-07-14 DIAGNOSIS — N2581 Secondary hyperparathyroidism of renal origin: Secondary | ICD-10-CM | POA: Diagnosis not present

## 2021-07-14 DIAGNOSIS — G8929 Other chronic pain: Secondary | ICD-10-CM | POA: Diagnosis not present

## 2021-07-16 DIAGNOSIS — G8929 Other chronic pain: Secondary | ICD-10-CM | POA: Diagnosis not present

## 2021-07-16 DIAGNOSIS — N186 End stage renal disease: Secondary | ICD-10-CM | POA: Diagnosis not present

## 2021-07-16 DIAGNOSIS — D509 Iron deficiency anemia, unspecified: Secondary | ICD-10-CM | POA: Diagnosis not present

## 2021-07-16 DIAGNOSIS — D688 Other specified coagulation defects: Secondary | ICD-10-CM | POA: Diagnosis not present

## 2021-07-16 DIAGNOSIS — N2581 Secondary hyperparathyroidism of renal origin: Secondary | ICD-10-CM | POA: Diagnosis not present

## 2021-07-16 DIAGNOSIS — Z992 Dependence on renal dialysis: Secondary | ICD-10-CM | POA: Diagnosis not present

## 2021-07-18 DIAGNOSIS — N2581 Secondary hyperparathyroidism of renal origin: Secondary | ICD-10-CM | POA: Diagnosis not present

## 2021-07-18 DIAGNOSIS — N186 End stage renal disease: Secondary | ICD-10-CM | POA: Diagnosis not present

## 2021-07-18 DIAGNOSIS — D509 Iron deficiency anemia, unspecified: Secondary | ICD-10-CM | POA: Diagnosis not present

## 2021-07-18 DIAGNOSIS — Z992 Dependence on renal dialysis: Secondary | ICD-10-CM | POA: Diagnosis not present

## 2021-07-18 DIAGNOSIS — G8929 Other chronic pain: Secondary | ICD-10-CM | POA: Diagnosis not present

## 2021-07-18 DIAGNOSIS — D688 Other specified coagulation defects: Secondary | ICD-10-CM | POA: Diagnosis not present

## 2021-07-21 DIAGNOSIS — D688 Other specified coagulation defects: Secondary | ICD-10-CM | POA: Diagnosis not present

## 2021-07-21 DIAGNOSIS — Z992 Dependence on renal dialysis: Secondary | ICD-10-CM | POA: Diagnosis not present

## 2021-07-21 DIAGNOSIS — N186 End stage renal disease: Secondary | ICD-10-CM | POA: Diagnosis not present

## 2021-07-21 DIAGNOSIS — N2581 Secondary hyperparathyroidism of renal origin: Secondary | ICD-10-CM | POA: Diagnosis not present

## 2021-07-21 DIAGNOSIS — G8929 Other chronic pain: Secondary | ICD-10-CM | POA: Diagnosis not present

## 2021-07-21 DIAGNOSIS — D509 Iron deficiency anemia, unspecified: Secondary | ICD-10-CM | POA: Diagnosis not present

## 2021-07-23 DIAGNOSIS — Z992 Dependence on renal dialysis: Secondary | ICD-10-CM | POA: Diagnosis not present

## 2021-07-23 DIAGNOSIS — N186 End stage renal disease: Secondary | ICD-10-CM | POA: Diagnosis not present

## 2021-07-23 DIAGNOSIS — N2581 Secondary hyperparathyroidism of renal origin: Secondary | ICD-10-CM | POA: Diagnosis not present

## 2021-07-23 DIAGNOSIS — D509 Iron deficiency anemia, unspecified: Secondary | ICD-10-CM | POA: Diagnosis not present

## 2021-07-23 DIAGNOSIS — G8929 Other chronic pain: Secondary | ICD-10-CM | POA: Diagnosis not present

## 2021-07-23 DIAGNOSIS — D688 Other specified coagulation defects: Secondary | ICD-10-CM | POA: Diagnosis not present

## 2021-07-25 DIAGNOSIS — N2581 Secondary hyperparathyroidism of renal origin: Secondary | ICD-10-CM | POA: Diagnosis not present

## 2021-07-25 DIAGNOSIS — D509 Iron deficiency anemia, unspecified: Secondary | ICD-10-CM | POA: Diagnosis not present

## 2021-07-25 DIAGNOSIS — Z992 Dependence on renal dialysis: Secondary | ICD-10-CM | POA: Diagnosis not present

## 2021-07-25 DIAGNOSIS — N186 End stage renal disease: Secondary | ICD-10-CM | POA: Diagnosis not present

## 2021-07-25 DIAGNOSIS — D688 Other specified coagulation defects: Secondary | ICD-10-CM | POA: Diagnosis not present

## 2021-07-25 DIAGNOSIS — G8929 Other chronic pain: Secondary | ICD-10-CM | POA: Diagnosis not present

## 2021-07-28 DIAGNOSIS — G8929 Other chronic pain: Secondary | ICD-10-CM | POA: Diagnosis not present

## 2021-07-28 DIAGNOSIS — Z992 Dependence on renal dialysis: Secondary | ICD-10-CM | POA: Diagnosis not present

## 2021-07-28 DIAGNOSIS — D509 Iron deficiency anemia, unspecified: Secondary | ICD-10-CM | POA: Diagnosis not present

## 2021-07-28 DIAGNOSIS — N2581 Secondary hyperparathyroidism of renal origin: Secondary | ICD-10-CM | POA: Diagnosis not present

## 2021-07-28 DIAGNOSIS — N186 End stage renal disease: Secondary | ICD-10-CM | POA: Diagnosis not present

## 2021-07-28 DIAGNOSIS — D688 Other specified coagulation defects: Secondary | ICD-10-CM | POA: Diagnosis not present

## 2021-07-30 DIAGNOSIS — D509 Iron deficiency anemia, unspecified: Secondary | ICD-10-CM | POA: Diagnosis not present

## 2021-07-30 DIAGNOSIS — Z992 Dependence on renal dialysis: Secondary | ICD-10-CM | POA: Diagnosis not present

## 2021-07-30 DIAGNOSIS — N186 End stage renal disease: Secondary | ICD-10-CM | POA: Diagnosis not present

## 2021-07-30 DIAGNOSIS — N2581 Secondary hyperparathyroidism of renal origin: Secondary | ICD-10-CM | POA: Diagnosis not present

## 2021-07-30 DIAGNOSIS — G8929 Other chronic pain: Secondary | ICD-10-CM | POA: Diagnosis not present

## 2021-07-30 DIAGNOSIS — D688 Other specified coagulation defects: Secondary | ICD-10-CM | POA: Diagnosis not present

## 2021-08-01 DIAGNOSIS — N186 End stage renal disease: Secondary | ICD-10-CM | POA: Diagnosis not present

## 2021-08-01 DIAGNOSIS — N2581 Secondary hyperparathyroidism of renal origin: Secondary | ICD-10-CM | POA: Diagnosis not present

## 2021-08-01 DIAGNOSIS — D688 Other specified coagulation defects: Secondary | ICD-10-CM | POA: Diagnosis not present

## 2021-08-01 DIAGNOSIS — D509 Iron deficiency anemia, unspecified: Secondary | ICD-10-CM | POA: Diagnosis not present

## 2021-08-01 DIAGNOSIS — Z992 Dependence on renal dialysis: Secondary | ICD-10-CM | POA: Diagnosis not present

## 2021-08-01 DIAGNOSIS — G8929 Other chronic pain: Secondary | ICD-10-CM | POA: Diagnosis not present

## 2021-08-04 DIAGNOSIS — Z992 Dependence on renal dialysis: Secondary | ICD-10-CM | POA: Diagnosis not present

## 2021-08-04 DIAGNOSIS — D688 Other specified coagulation defects: Secondary | ICD-10-CM | POA: Diagnosis not present

## 2021-08-04 DIAGNOSIS — N186 End stage renal disease: Secondary | ICD-10-CM | POA: Diagnosis not present

## 2021-08-04 DIAGNOSIS — N2581 Secondary hyperparathyroidism of renal origin: Secondary | ICD-10-CM | POA: Diagnosis not present

## 2021-08-04 DIAGNOSIS — G8929 Other chronic pain: Secondary | ICD-10-CM | POA: Diagnosis not present

## 2021-08-04 DIAGNOSIS — D509 Iron deficiency anemia, unspecified: Secondary | ICD-10-CM | POA: Diagnosis not present

## 2021-08-05 ENCOUNTER — Emergency Department (HOSPITAL_COMMUNITY): Payer: Medicare Other

## 2021-08-05 ENCOUNTER — Inpatient Hospital Stay (HOSPITAL_COMMUNITY)
Admission: EM | Admit: 2021-08-05 | Discharge: 2021-08-11 | DRG: 871 | Disposition: A | Discharge status: Home / Self Care | Payer: Medicare Other | Attending: Family Medicine | Admitting: Family Medicine

## 2021-08-05 ENCOUNTER — Encounter (HOSPITAL_COMMUNITY): Payer: Self-pay | Admitting: Emergency Medicine

## 2021-08-05 ENCOUNTER — Encounter: Payer: Self-pay | Admitting: Internal Medicine

## 2021-08-05 ENCOUNTER — Ambulatory Visit (INDEPENDENT_AMBULATORY_CARE_PROVIDER_SITE_OTHER): Payer: Medicare Other | Admitting: Internal Medicine

## 2021-08-05 ENCOUNTER — Other Ambulatory Visit: Payer: Self-pay

## 2021-08-05 ENCOUNTER — Telehealth: Payer: Self-pay

## 2021-08-05 ENCOUNTER — Other Ambulatory Visit (HOSPITAL_COMMUNITY): Payer: Self-pay

## 2021-08-05 ENCOUNTER — Ambulatory Visit (INDEPENDENT_AMBULATORY_CARE_PROVIDER_SITE_OTHER): Payer: Medicare Other

## 2021-08-05 VITALS — BP 112/58 | HR 95 | Temp 98.5°F | Resp 16 | Ht 62.0 in | Wt 217.2 lb

## 2021-08-05 DIAGNOSIS — M797 Fibromyalgia: Secondary | ICD-10-CM | POA: Diagnosis present

## 2021-08-05 DIAGNOSIS — R509 Fever, unspecified: Secondary | ICD-10-CM | POA: Insufficient documentation

## 2021-08-05 DIAGNOSIS — D649 Anemia, unspecified: Secondary | ICD-10-CM | POA: Diagnosis not present

## 2021-08-05 DIAGNOSIS — Z96653 Presence of artificial knee joint, bilateral: Secondary | ICD-10-CM | POA: Diagnosis not present

## 2021-08-05 DIAGNOSIS — F332 Major depressive disorder, recurrent severe without psychotic features: Secondary | ICD-10-CM | POA: Diagnosis not present

## 2021-08-05 DIAGNOSIS — Z7984 Long term (current) use of oral hypoglycemic drugs: Secondary | ICD-10-CM

## 2021-08-05 DIAGNOSIS — R10817 Generalized abdominal tenderness: Secondary | ICD-10-CM | POA: Insufficient documentation

## 2021-08-05 DIAGNOSIS — E872 Acidosis: Secondary | ICD-10-CM | POA: Diagnosis present

## 2021-08-05 DIAGNOSIS — Z20822 Contact with and (suspected) exposure to covid-19: Secondary | ICD-10-CM | POA: Diagnosis not present

## 2021-08-05 DIAGNOSIS — R0602 Shortness of breath: Secondary | ICD-10-CM | POA: Diagnosis not present

## 2021-08-05 DIAGNOSIS — K529 Noninfective gastroenteritis and colitis, unspecified: Secondary | ICD-10-CM | POA: Diagnosis not present

## 2021-08-05 DIAGNOSIS — G8929 Other chronic pain: Secondary | ICD-10-CM

## 2021-08-05 DIAGNOSIS — N186 End stage renal disease: Secondary | ICD-10-CM | POA: Diagnosis not present

## 2021-08-05 DIAGNOSIS — D631 Anemia in chronic kidney disease: Secondary | ICD-10-CM | POA: Diagnosis present

## 2021-08-05 DIAGNOSIS — Z7982 Long term (current) use of aspirin: Secondary | ICD-10-CM

## 2021-08-05 DIAGNOSIS — K429 Umbilical hernia without obstruction or gangrene: Secondary | ICD-10-CM | POA: Diagnosis present

## 2021-08-05 DIAGNOSIS — K5900 Constipation, unspecified: Secondary | ICD-10-CM | POA: Diagnosis not present

## 2021-08-05 DIAGNOSIS — I10 Essential (primary) hypertension: Secondary | ICD-10-CM | POA: Diagnosis present

## 2021-08-05 DIAGNOSIS — M810 Age-related osteoporosis without current pathological fracture: Secondary | ICD-10-CM | POA: Diagnosis not present

## 2021-08-05 DIAGNOSIS — Z992 Dependence on renal dialysis: Secondary | ICD-10-CM | POA: Diagnosis not present

## 2021-08-05 DIAGNOSIS — D72829 Elevated white blood cell count, unspecified: Secondary | ICD-10-CM | POA: Diagnosis not present

## 2021-08-05 DIAGNOSIS — D7289 Other specified disorders of white blood cells: Secondary | ICD-10-CM | POA: Diagnosis not present

## 2021-08-05 DIAGNOSIS — J9811 Atelectasis: Secondary | ICD-10-CM | POA: Diagnosis not present

## 2021-08-05 DIAGNOSIS — K219 Gastro-esophageal reflux disease without esophagitis: Secondary | ICD-10-CM | POA: Diagnosis not present

## 2021-08-05 DIAGNOSIS — E669 Obesity, unspecified: Secondary | ICD-10-CM | POA: Diagnosis present

## 2021-08-05 DIAGNOSIS — J309 Allergic rhinitis, unspecified: Secondary | ICD-10-CM | POA: Diagnosis not present

## 2021-08-05 DIAGNOSIS — Z818 Family history of other mental and behavioral disorders: Secondary | ICD-10-CM

## 2021-08-05 DIAGNOSIS — Z6839 Body mass index (BMI) 39.0-39.9, adult: Secondary | ICD-10-CM | POA: Diagnosis not present

## 2021-08-05 DIAGNOSIS — N269 Renal sclerosis, unspecified: Secondary | ICD-10-CM | POA: Diagnosis not present

## 2021-08-05 DIAGNOSIS — E7800 Pure hypercholesterolemia, unspecified: Secondary | ICD-10-CM | POA: Diagnosis not present

## 2021-08-05 DIAGNOSIS — N2581 Secondary hyperparathyroidism of renal origin: Secondary | ICD-10-CM | POA: Diagnosis present

## 2021-08-05 DIAGNOSIS — I959 Hypotension, unspecified: Secondary | ICD-10-CM | POA: Diagnosis not present

## 2021-08-05 DIAGNOSIS — M5030 Other cervical disc degeneration, unspecified cervical region: Secondary | ICD-10-CM | POA: Diagnosis not present

## 2021-08-05 DIAGNOSIS — A419 Sepsis, unspecified organism: Secondary | ICD-10-CM | POA: Diagnosis not present

## 2021-08-05 DIAGNOSIS — E1122 Type 2 diabetes mellitus with diabetic chronic kidney disease: Secondary | ICD-10-CM | POA: Diagnosis present

## 2021-08-05 DIAGNOSIS — R1084 Generalized abdominal pain: Secondary | ICD-10-CM | POA: Diagnosis not present

## 2021-08-05 DIAGNOSIS — R109 Unspecified abdominal pain: Secondary | ICD-10-CM | POA: Diagnosis not present

## 2021-08-05 DIAGNOSIS — Z881 Allergy status to other antibiotic agents status: Secondary | ICD-10-CM

## 2021-08-05 DIAGNOSIS — M4716 Other spondylosis with myelopathy, lumbar region: Secondary | ICD-10-CM | POA: Diagnosis present

## 2021-08-05 DIAGNOSIS — E119 Type 2 diabetes mellitus without complications: Secondary | ICD-10-CM

## 2021-08-05 DIAGNOSIS — M898X9 Other specified disorders of bone, unspecified site: Secondary | ICD-10-CM | POA: Diagnosis present

## 2021-08-05 DIAGNOSIS — M8580 Other specified disorders of bone density and structure, unspecified site: Secondary | ICD-10-CM | POA: Diagnosis present

## 2021-08-05 DIAGNOSIS — F32A Depression, unspecified: Secondary | ICD-10-CM | POA: Diagnosis present

## 2021-08-05 DIAGNOSIS — Z833 Family history of diabetes mellitus: Secondary | ICD-10-CM

## 2021-08-05 DIAGNOSIS — E1129 Type 2 diabetes mellitus with other diabetic kidney complication: Secondary | ICD-10-CM

## 2021-08-05 DIAGNOSIS — F419 Anxiety disorder, unspecified: Secondary | ICD-10-CM | POA: Diagnosis present

## 2021-08-05 DIAGNOSIS — M545 Low back pain, unspecified: Secondary | ICD-10-CM | POA: Diagnosis present

## 2021-08-05 DIAGNOSIS — R1013 Epigastric pain: Secondary | ICD-10-CM | POA: Diagnosis not present

## 2021-08-05 DIAGNOSIS — I872 Venous insufficiency (chronic) (peripheral): Secondary | ICD-10-CM | POA: Diagnosis not present

## 2021-08-05 DIAGNOSIS — D539 Nutritional anemia, unspecified: Secondary | ICD-10-CM | POA: Diagnosis not present

## 2021-08-05 DIAGNOSIS — I120 Hypertensive chronic kidney disease with stage 5 chronic kidney disease or end stage renal disease: Secondary | ICD-10-CM | POA: Diagnosis present

## 2021-08-05 DIAGNOSIS — K52832 Lymphocytic colitis: Secondary | ICD-10-CM | POA: Diagnosis present

## 2021-08-05 DIAGNOSIS — Z888 Allergy status to other drugs, medicaments and biological substances status: Secondary | ICD-10-CM

## 2021-08-05 DIAGNOSIS — Z87891 Personal history of nicotine dependence: Secondary | ICD-10-CM | POA: Diagnosis not present

## 2021-08-05 DIAGNOSIS — Z79899 Other long term (current) drug therapy: Secondary | ICD-10-CM

## 2021-08-05 DIAGNOSIS — K6389 Other specified diseases of intestine: Secondary | ICD-10-CM | POA: Diagnosis not present

## 2021-08-05 DIAGNOSIS — N250 Renal osteodystrophy: Secondary | ICD-10-CM | POA: Diagnosis not present

## 2021-08-05 DIAGNOSIS — Z83438 Family history of other disorder of lipoprotein metabolism and other lipidemia: Secondary | ICD-10-CM

## 2021-08-05 DIAGNOSIS — Z8249 Family history of ischemic heart disease and other diseases of the circulatory system: Secondary | ICD-10-CM

## 2021-08-05 LAB — URINALYSIS, ROUTINE W REFLEX MICROSCOPIC
Bilirubin Urine: NEGATIVE
Bilirubin Urine: NEGATIVE
Glucose, UA: NEGATIVE mg/dL
Hgb urine dipstick: NEGATIVE
Ketones, ur: NEGATIVE
Ketones, ur: NEGATIVE mg/dL
Leukocytes,Ua: NEGATIVE
Nitrite: NEGATIVE
Nitrite: NEGATIVE
Protein, ur: 30 mg/dL — AB
RBC / HPF: NONE SEEN (ref 0–?)
Specific Gravity, Urine: 1.015 (ref 1.000–1.030)
Specific Gravity, Urine: 1.02 (ref 1.005–1.030)
Total Protein, Urine: 100 — AB
Urine Glucose: NEGATIVE
Urobilinogen, UA: 0.2 (ref 0.0–1.0)
pH: 6 (ref 5.0–8.0)
pH: 5 (ref 5.0–8.0)

## 2021-08-05 LAB — BASIC METABOLIC PANEL
BUN: 42 mg/dL — ABNORMAL HIGH (ref 6–23)
CO2: 28 mEq/L (ref 19–32)
Calcium: 10.3 mg/dL (ref 8.4–10.5)
Chloride: 87 mEq/L — ABNORMAL LOW (ref 96–112)
Creatinine, Ser: 4.49 mg/dL — ABNORMAL HIGH (ref 0.40–1.20)
GFR: 9.48 mL/min — CL (ref >60.00)
Glucose, Bld: 145 mg/dL — ABNORMAL HIGH (ref 70–99)
Potassium: 5 mEq/L (ref 3.5–5.1)
Sodium: 130 mEq/L — ABNORMAL LOW (ref 135–145)

## 2021-08-05 LAB — CBC WITH DIFFERENTIAL/PLATELET
Abs Immature Granulocytes: 0.19 10*3/uL — ABNORMAL HIGH (ref 0.00–0.07)
Basophils Absolute: 0.1 10*3/uL (ref 0.0–0.1)
Basophils Absolute: 0.1 10*3/uL (ref 0.0–0.1)
Basophils Relative: 0.2 % (ref 0.0–3.0)
Basophils Relative: 0 %
Eosinophils Absolute: 0.2 10*3/uL (ref 0.0–0.7)
Eosinophils Absolute: 0.2 10*3/uL (ref 0.0–0.5)
Eosinophils Relative: 0.6 % (ref 0.0–5.0)
Eosinophils Relative: 1 %
HCT: 33.6 % — ABNORMAL LOW (ref 36.0–46.0)
HCT: 36.4 % (ref 36.0–46.0)
Hemoglobin: 11 g/dL — ABNORMAL LOW (ref 12.0–15.0)
Hemoglobin: 11.7 g/dL — ABNORMAL LOW (ref 12.0–15.0)
Immature Granulocytes: 1 %
Lymphocytes Relative: 7.6 % — ABNORMAL LOW (ref 12.0–46.0)
Lymphocytes Relative: 7 %
Lymphs Abs: 2.1 10*3/uL (ref 0.7–4.0)
Lymphs Abs: 1.9 10*3/uL (ref 0.7–4.0)
MCH: 33.4 pg (ref 26.0–34.0)
MCHC: 32.8 g/dL (ref 30.0–36.0)
MCHC: 32.1 g/dL (ref 30.0–36.0)
MCV: 99.8 fl (ref 78.0–100.0)
MCV: 104 fL — ABNORMAL HIGH (ref 80.0–100.0)
Monocytes Absolute: 2 10*3/uL — ABNORMAL HIGH (ref 0.1–1.0)
Monocytes Absolute: 1.6 10*3/uL — ABNORMAL HIGH (ref 0.1–1.0)
Monocytes Relative: 7.6 % (ref 3.0–12.0)
Monocytes Relative: 6 %
Neutro Abs: 22.6 10*3/uL — ABNORMAL HIGH (ref 1.4–7.7)
Neutro Abs: 21.3 10*3/uL — ABNORMAL HIGH (ref 1.7–7.7)
Neutrophils Relative %: 84 % — ABNORMAL HIGH (ref 43.0–77.0)
Neutrophils Relative %: 85 %
Platelets: 198 10*3/uL (ref 150.0–400.0)
Platelets: 181 10*3/uL (ref 150–400)
RBC: 3.37 Mil/uL — ABNORMAL LOW (ref 3.87–5.11)
RBC: 3.5 MIL/uL — ABNORMAL LOW (ref 3.87–5.11)
RDW: 13.9 % (ref 11.5–15.5)
RDW: 13.3 % (ref 11.5–15.5)
WBC: 26.8 10*3/uL (ref 4.0–10.5)
WBC: 25.2 10*3/uL — ABNORMAL HIGH (ref 4.0–10.5)
nRBC: 0 % (ref 0.0–0.2)

## 2021-08-05 LAB — LACTIC ACID, PLASMA
Lactic Acid, Venous: 3.5 mmol/L (ref 0.5–1.9)
Lactic Acid, Venous: 2.4 mmol/L (ref 0.5–1.9)

## 2021-08-05 LAB — COMPREHENSIVE METABOLIC PANEL
ALT: 25 U/L (ref 0–44)
AST: 27 U/L (ref 15–41)
Albumin: 3.6 g/dL (ref 3.5–5.0)
Alkaline Phosphatase: 93 U/L (ref 38–126)
Anion gap: 17 — ABNORMAL HIGH (ref 5–15)
BUN: 42 mg/dL — ABNORMAL HIGH (ref 8–23)
CO2: 25 mmol/L (ref 22–32)
Calcium: 9.9 mg/dL (ref 8.9–10.3)
Chloride: 89 mmol/L — ABNORMAL LOW (ref 98–111)
Creatinine, Ser: 4.76 mg/dL — ABNORMAL HIGH (ref 0.44–1.00)
GFR, Estimated: 9 mL/min — ABNORMAL LOW (ref >60)
Glucose, Bld: 142 mg/dL — ABNORMAL HIGH (ref 70–99)
Potassium: 4.3 mmol/L (ref 3.5–5.1)
Sodium: 131 mmol/L — ABNORMAL LOW (ref 135–145)
Total Bilirubin: 1 mg/dL (ref 0.3–1.2)
Total Protein: 7.3 g/dL (ref 6.5–8.1)

## 2021-08-05 LAB — HEPATIC FUNCTION PANEL
ALT: 22 U/L (ref 0–35)
AST: 21 U/L (ref 0–37)
Albumin: 4 g/dL (ref 3.5–5.2)
Alkaline Phosphatase: 101 U/L (ref 39–117)
Bilirubin, Direct: 0.1 mg/dL (ref 0.0–0.3)
Total Bilirubin: 0.5 mg/dL (ref 0.2–1.2)
Total Protein: 7.4 g/dL (ref 6.0–8.3)

## 2021-08-05 LAB — LIPASE: Lipase: 5 U/L — ABNORMAL LOW (ref 11.0–59.0)

## 2021-08-05 LAB — AMYLASE: Amylase: 20 U/L — ABNORMAL LOW (ref 27–131)

## 2021-08-05 LAB — LIPASE, BLOOD: Lipase: 20 U/L (ref 11–51)

## 2021-08-05 MED ORDER — HYDROMORPHONE HCL 1 MG/ML IJ SOLN
0.5000 mg | INTRAMUSCULAR | Status: DC | PRN
  Administered 2021-08-06 – 2021-08-09 (×4): 0.5 mg via INTRAVENOUS
  Filled 2021-08-05 (×4): qty 1

## 2021-08-05 MED ORDER — ONDANSETRON HCL 4 MG/2ML IJ SOLN
4.0000 mg | Freq: Once | INTRAMUSCULAR | Status: AC
  Administered 2021-08-05: 4 mg via INTRAVENOUS
  Filled 2021-08-05: qty 2

## 2021-08-05 MED ORDER — PANTOPRAZOLE SODIUM 40 MG PO TBEC
40.0000 mg | DELAYED_RELEASE_TABLET | Freq: Every day | ORAL | Status: DC
  Administered 2021-08-06 – 2021-08-11 (×6): 40 mg via ORAL
  Filled 2021-08-05 (×6): qty 1

## 2021-08-05 MED ORDER — SODIUM CHLORIDE 0.9 % IV BOLUS: 500.0000 mL | Freq: Once | INTRAVENOUS | Status: DC

## 2021-08-05 MED ORDER — SODIUM CHLORIDE 0.9 % IV BOLUS
500.0000 mL | Freq: Once | INTRAVENOUS | Status: AC
  Administered 2021-08-05: 500 mL via INTRAVENOUS

## 2021-08-05 MED ORDER — OXYCODONE HCL 15 MG PO TABS
15.0000 mg | ORAL_TABLET | Freq: Four times a day (QID) | ORAL | 0 refills | Status: DC | PRN
  Filled 2021-08-05: qty 120, 30d supply, fill #0

## 2021-08-05 MED ORDER — SODIUM CHLORIDE 0.9% FLUSH
3.0000 mL | Freq: Two times a day (BID) | INTRAVENOUS | Status: DC
  Administered 2021-08-05 – 2021-08-11 (×12): 3 mL via INTRAVENOUS

## 2021-08-05 MED ORDER — PIPERACILLIN-TAZOBACTAM IN DEX 2-0.25 GM/50ML IV SOLN
2.2500 g | Freq: Three times a day (TID) | INTRAVENOUS | Status: DC
  Administered 2021-08-05 – 2021-08-11 (×17): 2.25 g via INTRAVENOUS
  Filled 2021-08-05 (×21): qty 50

## 2021-08-05 MED ORDER — LIDOCAINE 5 % EX OINT
TOPICAL_OINTMENT | Freq: Three times a day (TID) | CUTANEOUS | Status: DC | PRN
  Filled 2021-08-05: qty 35.44

## 2021-08-05 MED ORDER — SODIUM CHLORIDE 0.9 % IV BOLUS: 1000.0000 mL | Freq: Once | INTRAVENOUS | Status: DC

## 2021-08-05 MED ORDER — MORPHINE SULFATE (PF) 4 MG/ML IV SOLN
4.0000 mg | Freq: Once | INTRAVENOUS | Status: AC
  Administered 2021-08-05: 4 mg via INTRAVENOUS
  Filled 2021-08-05: qty 1

## 2021-08-05 MED ORDER — DICYCLOMINE HCL 20 MG PO TABS: 20.0000 mg | ORAL_TABLET | ORAL | Status: DC | PRN

## 2021-08-05 MED ORDER — POLYVINYL ALCOHOL 1.4 % OP SOLN: 1.0000 [drp] | Freq: Every day | OPHTHALMIC | Status: DC | PRN

## 2021-08-05 MED ORDER — LACTATED RINGERS IV SOLN: INTRAVENOUS | Status: AC

## 2021-08-05 MED ORDER — ACETAMINOPHEN 325 MG PO TABS
650.0000 mg | ORAL_TABLET | Freq: Four times a day (QID) | ORAL | Status: DC | PRN
  Administered 2021-08-06 – 2021-08-11 (×7): 650 mg via ORAL
  Filled 2021-08-05 (×7): qty 2

## 2021-08-05 MED ORDER — ACETAMINOPHEN 650 MG RE SUPP: 650.0000 mg | Freq: Four times a day (QID) | RECTAL | Status: DC | PRN

## 2021-08-05 MED ORDER — INSULIN ASPART 100 UNIT/ML IJ SOLN: 0.0000 [IU] | Freq: Three times a day (TID) | INTRAMUSCULAR | Status: DC

## 2021-08-05 MED ORDER — PIPERACILLIN-TAZOBACTAM IN DEX 2-0.25 GM/50ML IV SOLN
2.2500 g | Freq: Three times a day (TID) | INTRAVENOUS | Status: DC
  Filled 2021-08-05 (×2): qty 50

## 2021-08-05 NOTE — Progress Notes (Signed)
Pharmacy Antibiotic Note  [name] is a 70 y.o. female admitted on 08/05/2021 with  suspected intra-abdominal infection .  Pharmacy has been consulted for zosyn dosing.  Pt complains of abdominal pain, abdominal distention along with fevers which began yesterday after her dialysis session.  Plan: Zosyn 2.25g q8h Monitor cultures De-escalate as appropriate     Temp (24hrs), Avg:98.1 F (36.7 C), Min:97.6 F (36.4 C), Max:98.5 F (36.9 C)  Recent Labs  Lab 08/05/21 1557 08/05/21 1816 08/05/21 1823  WBC 26.8 Repeated and verified X2.* 25.2*  --   CREATININE 4.49*  --   --   LATICACIDVEN  --   --  3.5*    Estimated Creatinine Clearance: 13 mL/min (A) (by C-G formula based on SCr of 4.49 mg/dL (H)).    Allergies  Allergen Reactions   Erythromycin Diarrhea and Nausea And Vomiting   Gabapentin     Confusion and falling    Antimicrobials this admission: zosyn 8/24 >>  Microbiology results: Pending  Thank you for allowing pharmacy to be a part of this patient's care.  [name] 08/05/2021 8:02 PM

## 2021-08-05 NOTE — ED Provider Notes (Signed)
Emergency Medicine Provider Triage Evaluation Note  [name] , a 70 y.o. female  was evaluated in triage.  Pt complains of abdominal pain, abdominal distention along with fevers which began yesterday after her dialysis session . feels a subjective fever for the last couple days.  Evaluated by PCP today, had x-ray which showed some stool burden with a likely impaction.  Reports a similar episode in the past, which she was evaluated by gastroenterology low [name] [name].  Last bowel movement yesterday, solid without any blood in her stool  Review of Systems  Positive: Abdominal pain, nausea Negative: Vomiting, chest pain, shortness of breath  Physical Exam  There were no vitals taken for this visit. Gen:   Awake, no distress   Resp:  Normal effort  MSK:   Moves extremities without difficulty  Other:    Medical Decision Making  Medically screening exam initiated at 6:16 PM.  Appropriate orders placed.  [name] was informed that the remainder of the evaluation will be completed by another provider, this initial triage assessment does not replace that evaluation, and the importance of remaining in the ED until their evaluation is complete.  Patient here with generalized abdominal pain after her dialysis session yesterday.  X-ray shows some stool burden, some suspicion for impaction versus obstruction.  Sent here by PCP.  Does have a white count of 26,000 according to records this morning.  Will order CT along with repeat labs.   Anita Fitting, PA-C 08/05/21 1821    Lajean Saver, MD 08/05/21 [id number]

## 2021-08-05 NOTE — ED Notes (Signed)
Admitting Provider at bedside assessing pt.

## 2021-08-05 NOTE — ED Provider Notes (Signed)
[facility] EMERGENCY DEPARTMENT Provider Note   CSN: [id number] Arrival date & time: 08/05/21  1710     History No chief complaint on file.   [name] is a 70 y.o. female.  70 year old female with prior medical history as detailed below presents for evaluation.  Patient reports onset of abdominal pain yesterday during dialysis.  Patient reports that during her dialysis session yesterday her blood pressure dropped.  She was given an extra 200 cc bolus of fluids to get her pressure up.  She developed abdominal pain concurrently with this drop in blood pressure.  She complains of continued pain to the mid abdomen since the episode yesterday.  She was seen by her doctor today who noted elevated white count.  She was sent to the ED for evaluation.  She reports subjective fever since the incident during dialysis yesterday.  The history is provided by the patient.  Abdominal Pain Pain location:  Generalized Pain quality: aching   Pain radiates to:  Does not radiate Pain severity:  Moderate Onset quality:  Sudden Duration:  1 day Timing:  Constant Progression:  Unchanged     Past Medical History:  Diagnosis Date   Anemia    Anxiety    Brain tumor (benign) ([place])    Colitis 2010   microscopic- Dr Henrene Pastor   Depression    Diabetes mellitus    type II   Dyspnea    with exertion   ESRD (end stage renal disease) ([place])    TTUSAT [address]    Fibromyalgia    GERD (gastroesophageal reflux disease)    Headache    History of blood transfusion    after knee surgery   Hypertension    discontinued all diuretics and antihypertensives   IBS (irritable bowel syndrome)    LBP (low back pain)    Neuropathy    feet bilat    Osteoarthritis    Osteopenia    Pneumonia    hx of 2014    Rotator cuff tear, right    Sinusitis    currently being treated with antibiotic will complete 03/04/2015    Patient Active Problem List   Diagnosis Date Noted   Generalized  abdominal tenderness without rebound tenderness 08/05/2021   Fever in adult 08/05/2021   Sepsis ([place]) 08/05/2021   Wrist pain, chronic, left 12/15/2020   Allergic rhinitis 08/14/2020   Cough 08/14/2020   Memory problem 07/28/2020   Other osteoporosis without current pathological fracture 05/21/2020   Confusion and disorientation 04/16/2020   DDD (degenerative disc disease), cervical 01/14/2020   Left elbow pain 10/17/2019   Pain in joint of right hip 09/12/2019   Lumbar spondylosis with myelopathy 09/12/2019   Intertrigo 04/16/2019   Tendinitis of left wrist 01/24/2019   Chest wall contusion, left, initial encounter 04/24/2018   Full thickness rotator cuff tear 04/14/2018   Falls frequently 03/20/2018   Neck pain 02/08/2018   Pain in joint of right shoulder 01/20/2018   ESRD (end stage renal disease) on dialysis ([place]) 10/13/2017   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   OA (osteoarthritis) of knee 03/05/2015   Rib pain on left side 02/27/2015   URI, acute 09/17/2013   Swelling of limb-Bilateral leg Left > than right 07/11/2013   Numbness-Left foot 07/11/2013   Chronic venous insufficiency 04/19/2013   Left hand pain 02/09/2013   Right shoulder pain 09/12/2012   Edema 04/24/2012   Grief 11/10/2011   ABSCESS, TOOTH 02/03/2011  OSTEOPENIA 10/28/2010   Fever 08/06/2010   HYPERKALEMIA 07/15/2010   ARTHRALGIA 04/08/2010   Fatigue 04/08/2010   XEROSTOMIA 02/04/2010   ECZEMA 10/29/2009   TOBACCO USE, QUIT 10/29/2009   Anemia 06/18/2009   Disorder resulting from impaired renal function 06/18/2009   Diarrhea 06/18/2009   OPACITY, VITREOUS HUMOR 01/15/2009   COLITIS 10/16/2008   KNEE PAIN 04/10/2008   HYPERCHOLESTEROLEMIA 01/10/2008   Anxiety disorder 11/08/2007   Depression 11/08/2007   Osteoarthritis 11/08/2007   LOW BACK PAIN 11/08/2007   Diabetes mellitus type 2, controlled ([place]) 10/07/2007   Essential hypertension 10/07/2007   GERD 10/07/2007    MICROALBUMINURIA 10/07/2007    Past Surgical History:  Procedure Laterality Date   AV FISTULA PLACEMENT Right 09/12/2020   Procedure: RIGHT ARTERIOVENOUS (AV) FISTULA CREATION;  Surgeon: Elam Dutch, MD;  Location: [facility];  Service: Vascular;  Laterality: Right;   [place] Right 10/27/2020   Procedure: RIGHT UPPER EXTREMITY SECOND STAGE [place];  Surgeon: Elam Dutch, MD;  Location: [facility] OR;  Service: Vascular;  Laterality: Right;   foramen magnum ependymoma surgery  2003   Dr Rita Ohara   JOINT REPLACEMENT Bilateral    NASAL SINUS SURGERY     1973    TONSILLECTOMY     TOTAL KNEE ARTHROPLASTY     L 2008, R 2009, R 2016- Dr Maureen Ralphs   TOTAL KNEE REVISION Right 03/05/2015   Procedure: RIGHT TOTAL KNEE ARTHROPLASTY REVISION;  Surgeon: Gaynelle Arabian, MD;  Location: [facility] ORS;  Service: Orthopedics;  Laterality: Right;     OB History   No obstetric history on file.     Family History  Problem Relation Age of Onset   Depression Mother    Hypertension Mother    Stroke Mother 38   Diabetes Mother    Diabetes Father    Hyperlipidemia Father    Hypertension Father    Crohn's disease Maternal Uncle    Diabetes Other    Cancer Brother        Prostate   Diabetes Brother    Heart disease Brother    Hyperlipidemia Brother    Hypertension Brother    Diabetes Brother    Heart disease Brother        Heart Disease before age 42   Heart attack Brother    Stroke Brother        X's 2   Hyperlipidemia Brother    Hypertension Brother    Colon cancer Neg Hx     Social History   Tobacco Use   Smoking status: Former    Packs/day: 1.00    Years: 20.00    Pack years: 20.00    Types: Cigarettes    Quit date: 12/13/1988    Years since quitting: 32.6   Smokeless tobacco: Never  Vaping Use   Vaping Use: Never used  Substance Use Topics   Alcohol use: Not Currently    Alcohol/week: 0.0 standard drinks    Comment: rarely   Drug use: No     Home Medications Prior to Admission medications   Medication Sig Start Date End Date Taking? Authorizing Provider  acetaminophen (TYLENOL) 500 MG tablet Take 1,000 mg by mouth every 6 (six) hours as needed for moderate pain or headache.    [provider]  amoxicillin (AMOXIL) 500 MG capsule TAKE 4 CAPSULES BY MOUTH 1  HOUR PRIOR TO DENTAL  PROCEDURE/CLEANING Patient taking differently: Take 2,000 mg by mouth See admin instructions. Take 2000 mg  1 hour prior to dental work 04/16/20   Plotnikov, Evie Lacks, MD  amoxicillin (AMOXIL) 500 MG capsule Take 1 capsule (500 mg total) by mouth 3 (three) times daily until gone 06/19/21     aspirin-acetaminophen-caffeine (EXCEDRIN MIGRAINE) 250-250-65 MG tablet Excedrin Migraine 250 mg-250 mg-65 mg tablet  Take by oral route.    [provider]  B Complex-C-Folic Acid (RENA-VITE PO) Take 1 tablet a day    [provider]  blood glucose meter kit and supplies Dispense based on patient and insurance preference. Used to check blood sugar daily, DX: E11.9 OneTouch 12/20/17   Plotnikov, Evie Lacks, MD  buPROPion (WELLBUTRIN XL) 300 MG 24 hr tablet Take 1 tablet (300 mg total) by mouth at bedtime. 10/29/20   Plotnikov, Evie Lacks, MD  cyclobenzaprine (FLEXERIL) 10 MG tablet Take 1 tablet (10 mg total) by mouth 3 (three) times daily as needed for muscle spasms. 04/09/21   Plotnikov, Evie Lacks, MD  dicyclomine (BENTYL) 20 MG tablet Take one tablet every 4-6 hours as needed for abdominal pain Patient taking differently: Take 20 mg by mouth every 4 (four) hours as needed (abdominal pain). 01/30/20   Irene Shipper, MD  DULoxetine (CYMBALTA) 60 MG capsule Take 1 capsule (60 mg total) by mouth at bedtime. 12/15/20   Plotnikov, Evie Lacks, MD  fexofenadine (ALLEGRA) 180 MG tablet Take 1 tablet (180 mg total) by mouth daily. Patient taking differently: Take 180 mg by mouth daily as needed for allergies. 08/14/20 08/14/21  Biagio Borg, MD  glimepiride  (AMARYL) 1 MG tablet Take 1 tablet (1 mg total) by mouth daily before breakfast. 10/29/20   Plotnikov, Evie Lacks, MD  glucose blood (TRUETEST TEST) test strip Used to check blood sugar daily, DX: E11.9 09/05/20   Plotnikov, Evie Lacks, MD  ketoconazole (NIZORAL) 2 % cream APPLY TO THE AFFECTED AREA(S) BY TOPICAL ROUTE ONCE DAILY 04/20/21     ketoconazole (NIZORAL) 200 MG tablet Take by mouth. Take 1/2 tab by mouth as needed 03/20/21   [provider]  Lancets [facility] ULTRASOFT) lancets Used to check blood sugar daily, DX: E11.9 12/20/17   Plotnikov, Evie Lacks, MD  lidocaine (XYLOCAINE) 5 % ointment APPLY TO AFFECTED AREA(S)  TOPICALLY 3 TIMES DAILY AS  NEEDED 11/25/20   Plotnikov, Evie Lacks, MD  lidocaine-prilocaine (EMLA) cream SMARTSIG:Sparingly Topical As Directed 12/10/20   [provider]  LORazepam (ATIVAN) 2 MG tablet TAKE 1 TABLET BY MOUTH ONCE EVERY NIGHT AT BEDTIME AS NEEDED INSOMNIA 02/09/21   Plotnikov, Evie Lacks, MD  LORazepam (ATIVAN) 2 MG tablet TAKE 1 TABLET BY MOUTH ONCE EVERY NIGHT AT BEDTIME AS NEEDED INSOMNIA 02/09/21 09/07/21  Plotnikov, Evie Lacks, MD  oxyCODONE (ROXICODONE) 15 MG immediate release tablet Take 1 tablet (15 mg total) by mouth 4 (four) times daily as needed. 08/05/21     pantoprazole (PROTONIX) 40 MG tablet Take 1 tablet (40 mg total) by mouth daily. 12/15/20   Plotnikov, Evie Lacks, MD  pilocarpine (SALAGEN) 5 MG tablet Take 1 tablet (5 mg total) by mouth 2 (two) times daily. 10/29/20   Plotnikov, Evie Lacks, MD  pioglitazone (ACTOS) 15 MG tablet Take 1 tablet (15 mg total) by mouth daily. 10/29/20   Plotnikov, Evie Lacks, MD  polyethylene glycol (MIRALAX / GLYCOLAX) 17 g packet Take 17 g by mouth daily as needed (constipation.).    [provider]  polyvinyl alcohol (LIQUIFILM TEARS) 1.4 % ophthalmic solution Place 1 drop into both eyes daily as needed  for dry eyes.    [provider]  sevelamer carbonate (RENVELA) 800 MG tablet Take 800 mg by  mouth 5 (five) times daily. Take 2 tablets tid and 1 tablet if eating a snack 11/07/20   [provider]    Allergies    Erythromycin and Gabapentin  Review of Systems   Review of Systems  Gastrointestinal:  Positive for abdominal pain.  All other systems reviewed and are negative.  Physical Exam Updated Vital Signs BP 94/64 (BP Location: Left Arm)   Pulse 84   Temp 98.3 F (36.8 C) (Oral)   Resp 18   Ht _0  (1.575 m)   Wt 98.4 kg   SpO2 99%   BMI 39.69 kg/m   Physical Exam Vitals and nursing note reviewed.  Constitutional:      General: She is not in acute distress.    Appearance: Normal appearance. She is well-developed.  HENT:     Head: Normocephalic and atraumatic.  Eyes:     Conjunctiva/sclera: Conjunctivae normal.     Pupils: Pupils are equal, round, and reactive to light.  Cardiovascular:     Rate and Rhythm: Normal rate and regular rhythm.     Heart sounds: Normal heart sounds.  Pulmonary:     Effort: Pulmonary effort is normal. No respiratory distress.     Breath sounds: Normal breath sounds.  Abdominal:     General: There is no distension.     Palpations: Abdomen is soft.     Tenderness: There is abdominal tenderness.     Comments: Mild diffuse abdominal tenderness.  No rebound, no guarding noted.  Musculoskeletal:        General: No deformity. Normal range of motion.     Cervical back: Normal range of motion and neck supple.  Skin:    General: Skin is warm and dry.  Neurological:     General: No focal deficit present.     Mental Status: She is alert and oriented to person, place, and time.    ED Results / Procedures / Treatments   Labs (all labs ordered are listed, but only abnormal results are displayed) Labs Reviewed  CBC WITH DIFFERENTIAL/PLATELET - Abnormal; Notable for the following components:      Result Value   WBC 25.2 (*)    RBC 3.50 (*)    Hemoglobin 11.7 (*)    MCV 104.0 (*)    Neutro Abs 21.3 (*)    Monocytes  Absolute 1.6 (*)    Abs Immature Granulocytes 0.19 (*)    All other components within normal limits  COMPREHENSIVE METABOLIC PANEL - Abnormal; Notable for the following components:   Sodium 131 (*)    Chloride 89 (*)    Glucose, Bld 142 (*)    BUN 42 (*)    Creatinine, Ser 4.76 (*)    GFR, Estimated 9 (*)    Anion gap 17 (*)    All other components within normal limits  LACTIC ACID, PLASMA - Abnormal; Notable for the following components:   Lactic Acid, Venous 3.5 (*)    All other components within normal limits  LIPASE, BLOOD  LACTIC ACID, PLASMA  URINALYSIS, ROUTINE W REFLEX MICROSCOPIC  HIV ANTIBODY (ROUTINE TESTING W REFLEX)  PROTIME-INR  CORTISOL-AM, BLOOD  PROCALCITONIN  COMPREHENSIVE METABOLIC PANEL  CBC    EKG None  Radiology CT ABDOMEN PELVIS WO CONTRAST  Result Date: 08/05/2021 CLINICAL DATA:  Abdominal pain and fever. Elevated white blood cell count. Dialysis patient. EXAM:  CT ABDOMEN AND PELVIS WITHOUT CONTRAST TECHNIQUE: Multidetector CT imaging of the abdomen and pelvis was performed following the standard protocol without IV contrast. COMPARISON:  Radiograph earlier today FINDINGS: Lower chest: No acute airspace disease or pleural effusion. Heart is normal in size. Hepatobiliary: There is no focal hepatic abnormality. Mild gallbladder distention without calcified gallstone. No pericholecystic fat stranding. No biliary dilatation. Pancreas: Mild atrophy.  No ductal dilatation or inflammation. Spleen: Normal in size without focal abnormality. Adrenals/Urinary Tract: Minimal left adrenal thickening. No adrenal nodule. Bilateral renal parenchymal atrophy consistent with chronic renal disease. 3.3 cm cyst in the periphery of the left kidney, with additional smaller renal cortical lesions are too small to characterize. No evidence of suspicious solid lesion. Urinary bladder is near completely empty. Stomach/Bowel: Detailed bowel assessment is limited in the absence of enteric  contrast. The stomach is nondistended. Short segment of small bowel in the right mid abdomen demonstrates mild distension, fecalized contents, wall thickening and perienteric fat stranding, for example series 3, image 56. this does not appear to represent a blind-ending tubular structure Meckel's diverticulum, rather a loop of your tight it small bowel. Normal appendix, series 6, image 36. Moderate volume of stool throughout the colon. There is transverse and sigmoid colonic redundancy. Mild sigmoid diverticulosis without focal diverticulitis. No colonic inflammation. Vascular/Lymphatic: Aortic atherosclerosis. No aortic aneurysm. No portal venous or mesenteric gas. Few prominent mesenteric lymph nodes, in the region of small bowel inflammation. There is no bulky abdominopelvic adenopathy. Reproductive: Small calcified uterine fibroids. The uterus is atrophic. No adnexal mass. Other: Moderate-sized fat containing umbilical hernia. No bowel involvement or inflammation. There is no ascites or free air. Musculoskeletal: Degenerative change in the spine. No focal bone abnormality or acute osseous findings. IMPRESSION: 1. Short segment of small bowel in the right mid abdomen demonstrates mild distension, fecalized contents, wall thickening, and perienteric fat stranding. Findings are suspicious for enteritis, may be infectious or inflammatory. No bowel perforation. 2. Mild sigmoid diverticulosis without focal diverticulitis. 3. Moderate-sized fat containing umbilical hernia. No bowel involvement or inflammation. 4. Sequela of chronic renal disease with renal parenchymal atrophy. Aortic Atherosclerosis (ICD10-I70.0). Electronically Signed   By: Keith Rake M.D.   On: 08/05/2021 18:51   DG Chest Port 1 View  Result Date: 08/05/2021 CLINICAL DATA:  Shortness of breath and fevers, initial encounter EXAM: PORTABLE CHEST 1 VIEW COMPARISON:  03/01/2016 FINDINGS: Cardiac shadow is within normal limits. Lungs are well  aerated bilaterally. Patchy left basilar airspace opacity is noted consistent with atelectasis as it was not seen on recent CT. No bony abnormality is noted. IMPRESSION: Mild left basilar atelectasis new from prior CT examination. Electronically Signed   By: Inez Catalina M.D.   On: 08/05/2021 20:21   DG ABD ACUTE 2+V W 1V CHEST  Result Date: 08/05/2021 CLINICAL DATA:  Abdominal pain, fever, previous tobacco abuse EXAM: DG ABDOMEN ACUTE WITH 1 VIEW CHEST COMPARISON:  02/22/2018 FINDINGS: Supine and upright frontal views of the abdomen and pelvis are obtained, excluding the flanks by collimation. Upright frontal view of the chest was also performed. Cardiac silhouette is unremarkable. No airspace disease, effusion, or pneumothorax. Bowel gas pattern is unremarkable without obstruction or ileus. There is moderate stool throughout the colon. No masses or abnormal calcifications. No free gas in the greater peritoneal sac. Left convex scoliosis centered in the upper lumbar spine. Diffuse multilevel thoracolumbar spondylosis. IMPRESSION: 1. Moderate retained stool throughout the colon. 2. No bowel obstruction or ileus. 3. No acute intrathoracic process.  Electronically Signed   By: Randa Ngo M.D.   On: 08/05/2021 15:30    Procedures Procedures   Medications Ordered in ED Medications  morphine 4 MG/ML injection 4 mg (has no administration in time range)  ondansetron (ZOFRAN) injection 4 mg (has no administration in time range)  piperacillin-tazobactam (ZOSYN) IVPB 2.25 g (has no administration in time range)  sodium chloride 0.9 % bolus 500 mL (has no administration in time range)  pantoprazole (PROTONIX) EC tablet 40 mg (has no administration in time range)  lidocaine (XYLOCAINE) 5 % ointment (has no administration in time range)  polyvinyl alcohol (LIQUIFILM TEARS) 1.4 % ophthalmic solution 1 drop (has no administration in time range)  sodium chloride flush (NS) 0.9 % injection 3 mL (has no  administration in time range)  acetaminophen (TYLENOL) tablet 650 mg (has no administration in time range)    Or  acetaminophen (TYLENOL) suppository 650 mg (has no administration in time range)  HYDROmorphone (DILAUDID) injection 0.5 mg (has no administration in time range)    ED Course  I have reviewed the triage vital signs and the nursing notes.  Pertinent labs & imaging results that were available during my care of the patient were reviewed by me and considered in my medical decision making (see chart for details).    MDM Rules/Calculators/A&P                           MDM  MSE complete  KELLENE MCCLEARY was evaluated in Emergency Department on 08/05/2021 for the symptoms described in the history of present illness. She was evaluated in the context of the global COVID-19 pandemic, which necessitated consideration that the patient might be at risk for infection with the SARS-CoV-2 virus that causes COVID-19. Institutional protocols and algorithms that pertain to the evaluation of patients at risk for COVID-19 are in a state of rapid change based on information released by regulatory bodies including the CDC and federal and state organizations. These policies and algorithms were followed during the patient's care in the ED.  Patient is presenting with complaint of abdominal pain.  Onset of abdominal pain was concurrent with drop in BP while on dialysis yesterday.  Patient CT imaging suggest possible enteritis.  Patient's story is suggestive of possible ischemic bowel.  Initial lactic acid is 3.5.  Initial white count of 26 noted.  Zosyn initiated in the ED.  Dr. Bobbye Morton of surgery is aware of case and will consult.  Hospitalist service is aware case and will evaluate for admission.   Final Clinical Impression(s) / ED Diagnoses Final diagnoses:  Generalized abdominal pain    Rx / DC Orders ED Discharge Orders     None        Valarie Merino, MD 08/05/21 2103

## 2021-08-05 NOTE — Consult Note (Signed)
Reason for Consult/Chief Complaint: abdominal pain, enteritis Consultant: Irene Pap, PA  [name] is an 70 y.o. female.   HPI: 31F with a one day history of abdominal pain that started during her dialysis session yesterday, 8/23. She reports the pain was associated with diaphoresis and a pre-syncopal feeling. She reports they gave her back 200cc followed by another 200cc without improvement in her symptoms. Pain is diffuse, not associated with nausea or vomiting, as has remained stable since onset. She reports some constipation, but states that is within her normal as she is titrating miralax to combat her increased dose of sevelamer.   Past Medical History:  Diagnosis Date   Anemia    Anxiety    Brain tumor (benign) ([place])    Colitis 2010   microscopic- Dr Henrene Pastor   Depression    Diabetes mellitus    type II   Dyspnea    with exertion   ESRD (end stage renal disease) ([place])    TTUSAT [address]    Fibromyalgia    GERD (gastroesophageal reflux disease)    Headache    History of blood transfusion    after knee surgery   Hypertension    discontinued all diuretics and antihypertensives   IBS (irritable bowel syndrome)    LBP (low back pain)    Neuropathy    feet bilat    Osteoarthritis    Osteopenia    Pneumonia    hx of 2014    Rotator cuff tear, right    Sinusitis    currently being treated with antibiotic will complete 03/04/2015    Past Surgical History:  Procedure Laterality Date   AV FISTULA PLACEMENT Right 09/12/2020   Procedure: RIGHT ARTERIOVENOUS (AV) FISTULA CREATION;  Surgeon: Elam Dutch, MD;  Location: [facility];  Service: Vascular;  Laterality: Right;   [place] Right 10/27/2020   Procedure: RIGHT UPPER EXTREMITY SECOND STAGE [place];  Surgeon: Elam Dutch, MD;  Location: [facility];  Service: Vascular;  Laterality: Right;   foramen magnum ependymoma surgery  2003   Dr Rita Ohara   JOINT REPLACEMENT Bilateral     NASAL SINUS SURGERY     1973    TONSILLECTOMY     TOTAL KNEE ARTHROPLASTY     L 2008, R 2009, R 2016- Dr Maureen Ralphs   TOTAL KNEE REVISION Right 03/05/2015   Procedure: RIGHT TOTAL KNEE ARTHROPLASTY REVISION;  Surgeon: Gaynelle Arabian, MD;  Location: [facility];  Service: Orthopedics;  Laterality: Right;    Family History  Problem Relation Age of Onset   Depression Mother    Hypertension Mother    Stroke Mother 10   Diabetes Mother    Diabetes Father    Hyperlipidemia Father    Hypertension Father    Crohn's disease Maternal Uncle    Diabetes Other    Cancer Brother        Prostate   Diabetes Brother    Heart disease Brother    Hyperlipidemia Brother    Hypertension Brother    Diabetes Brother    Heart disease Brother        Heart Disease before age 62   Heart attack Brother    Stroke Brother        X's 2   Hyperlipidemia Brother    Hypertension Brother    Colon cancer Neg Hx     Social History:  reports that she quit smoking about 32 years ago. Her smoking use included  cigarettes. She has a 20.00 pack-year smoking history. She has never used smokeless tobacco. She reports that she does not currently use alcohol. She reports that she does not use drugs.  Allergies:  Allergies  Allergen Reactions   Erythromycin Diarrhea and Nausea And Vomiting   Gabapentin     Confusion and falling     Medications: I have reviewed the patient's current medications.  Results for orders placed or performed during the hospital encounter of 08/05/21 (from the past 48 hour(s))  CBC with Differential     Status: Abnormal   Collection Time: 08/05/21  6:16 PM  Result Value Ref Range   WBC 25.2 (H) 4.0 - 10.5 K/uL    Comment: WHITE COUNT CONFIRMED ON SMEAR   RBC 3.50 (L) 3.87 - 5.11 MIL/uL   Hemoglobin 11.7 (L) 12.0 - 15.0 g/dL   HCT 36.4 36.0 - 46.0 %   MCV 104.0 (H) 80.0 - 100.0 fL   MCH 33.4 26.0 - 34.0 pg   MCHC 32.1 30.0 - 36.0 g/dL   RDW 13.3 11.5 - 15.5 %   Platelets 181 150 - 400 K/uL    nRBC 0.0 0.0 - 0.2 %   Neutrophils Relative % 85 %   Neutro Abs 21.3 (H) 1.7 - 7.7 K/uL   Lymphocytes Relative 7 %   Lymphs Abs 1.9 0.7 - 4.0 K/uL   Monocytes Relative 6 %   Monocytes Absolute 1.6 (H) 0.1 - 1.0 K/uL   Eosinophils Relative 1 %   Eosinophils Absolute 0.2 0.0 - 0.5 K/uL   Basophils Relative 0 %   Basophils Absolute 0.1 0.0 - 0.1 K/uL   Immature Granulocytes 1 %   Abs Immature Granulocytes 0.19 (H) 0.00 - 0.07 K/uL    Comment: Performed at [facility], [address] [place], [place] [address]  Comprehensive metabolic panel     Status: Abnormal   Collection Time: 08/05/21  6:16 PM  Result Value Ref Range   Sodium 131 (L) 135 - 145 mmol/L   Potassium 4.3 3.5 - 5.1 mmol/L   Chloride 89 (L) 98 - 111 mmol/L   CO2 25 22 - 32 mmol/L   Glucose, Bld 142 (H) 70 - 99 mg/dL    Comment: Glucose reference range applies only to samples taken after fasting for at least 8 hours.   BUN 42 (H) 8 - 23 mg/dL   Creatinine, Ser 4.76 (H) 0.44 - 1.00 mg/dL   Calcium 9.9 8.9 - 10.3 mg/dL   Total Protein 7.3 6.5 - 8.1 g/dL   Albumin 3.6 3.5 - 5.0 g/dL   AST 27 15 - 41 U/L   ALT 25 0 - 44 U/L   Alkaline Phosphatase 93 38 - 126 U/L   Total Bilirubin 1.0 0.3 - 1.2 mg/dL   GFR, Estimated 9 (L) >60 mL/min    Comment: (NOTE) Calculated using the CKD-EPI Creatinine Equation (2021)    Anion gap 17 (H) 5 - 15    Comment: Performed at [facility], [place] [address] [place], [place] [address]  Lipase, blood     Status: None   Collection Time: 08/05/21  6:16 PM  Result Value Ref Range   Lipase 20 11 - 51 U/L    Comment: Performed at [facility], [place] [address] [place], [place] [address]  Lactic acid, plasma     Status: Abnormal   Collection Time: 08/05/21  6:23 PM  Result Value Ref Range   Lactic Acid,  Venous 3.5 (HH) 0.5 - 1.9 mmol/L    Comment: CRITICAL RESULT CALLED TO, READ BACK BY AND VERIFIED WITH: M.ETIENNE,RN @1947  08/05/2021 [email or web address] Performed at [facility], [place] [address], [place], [place] [address]     CT ABDOMEN PELVIS WO CONTRAST  Result Date: 08/05/2021 CLINICAL DATA:  Abdominal pain and fever. Elevated white blood cell count. Dialysis patient. EXAM: CT ABDOMEN AND PELVIS WITHOUT CONTRAST TECHNIQUE: Multidetector CT imaging of the abdomen and pelvis was performed following the standard protocol without IV contrast. COMPARISON:  Radiograph earlier today FINDINGS: Lower chest: No acute airspace disease or pleural effusion. Heart is normal in size. Hepatobiliary: There is no focal hepatic abnormality. Mild gallbladder distention without calcified gallstone. No pericholecystic fat stranding. No biliary dilatation. Pancreas: Mild atrophy.  No ductal dilatation or inflammation. Spleen: Normal in size without focal abnormality. Adrenals/Urinary Tract: Minimal left adrenal thickening. No adrenal nodule. Bilateral renal parenchymal atrophy consistent with chronic renal disease. 3.3 cm cyst in the periphery of the left kidney, with additional smaller renal cortical lesions are too small to characterize. No evidence of suspicious solid lesion. Urinary bladder is near completely empty. Stomach/Bowel: Detailed bowel assessment is limited in the absence of enteric contrast. The stomach is nondistended. Short segment of small bowel in the right mid abdomen demonstrates mild distension, fecalized contents, wall thickening and perienteric fat stranding, for example series 3, image 56. this does not appear to represent a blind-ending tubular structure Meckel's diverticulum, rather a loop of your tight it small bowel. Normal appendix, series 6, image 36. Moderate volume of stool throughout the colon. There is transverse and sigmoid colonic redundancy. Mild sigmoid diverticulosis without focal diverticulitis. No colonic inflammation. Vascular/Lymphatic: Aortic atherosclerosis. No aortic aneurysm. No portal venous or mesenteric gas. Few prominent mesenteric lymph nodes,  in the region of small bowel inflammation. There is no bulky abdominopelvic adenopathy. Reproductive: Small calcified uterine fibroids. The uterus is atrophic. No adnexal mass. Other: Moderate-sized fat containing umbilical hernia. No bowel involvement or inflammation. There is no ascites or free air. Musculoskeletal: Degenerative change in the spine. No focal bone abnormality or acute osseous findings. IMPRESSION: 1. Short segment of small bowel in the right mid abdomen demonstrates mild distension, fecalized contents, wall thickening, and perienteric fat stranding. Findings are suspicious for enteritis, may be infectious or inflammatory. No bowel perforation. 2. Mild sigmoid diverticulosis without focal diverticulitis. 3. Moderate-sized fat containing umbilical hernia. No bowel involvement or inflammation. 4. Sequela of chronic renal disease with renal parenchymal atrophy. Aortic Atherosclerosis (ICD10-I70.0). Electronically Signed   By: Keith Rake M.D.   On: 08/05/2021 18:51   DG Chest Port 1 View  Result Date: 08/05/2021 CLINICAL DATA:  Shortness of breath and fevers, initial encounter EXAM: PORTABLE CHEST 1 VIEW COMPARISON:  03/01/2016 FINDINGS: Cardiac shadow is within normal limits. Lungs are well aerated bilaterally. Patchy left basilar airspace opacity is noted consistent with atelectasis as it was not seen on recent CT. No bony abnormality is noted. IMPRESSION: Mild left basilar atelectasis new from prior CT examination. Electronically Signed   By: Inez Catalina M.D.   On: 08/05/2021 20:21   DG ABD ACUTE 2+V W 1V CHEST  Result Date: 08/05/2021 CLINICAL DATA:  Abdominal pain, fever, previous tobacco abuse EXAM: DG ABDOMEN ACUTE WITH 1 VIEW CHEST COMPARISON:  02/22/2018 FINDINGS: Supine and upright frontal views of the abdomen and pelvis are obtained, excluding the flanks by collimation. Upright frontal view of the chest was also performed. Cardiac silhouette is unremarkable. No  airspace  disease, effusion, or pneumothorax. Bowel gas pattern is unremarkable without obstruction or ileus. There is moderate stool throughout the colon. No masses or abnormal calcifications. No free gas in the greater peritoneal sac. Left convex scoliosis centered in the upper lumbar spine. Diffuse multilevel thoracolumbar spondylosis. IMPRESSION: 1. Moderate retained stool throughout the colon. 2. No bowel obstruction or ileus. 3. No acute intrathoracic process. Electronically Signed   By: Randa Ngo M.D.   On: 08/05/2021 15:30    ROS 10 point review of systems is negative except as listed above in HPI.   Physical Exam Blood pressure 94/64, pulse 84, temperature 98.3 F (36.8 C), temperature source Oral, resp. rate 18, height 5\' 2"  (1.575 m), weight 98.4 kg, SpO2 99 %. Constitutional: well-developed, well-nourished HEENT: pupils equal, round, reactive to light, 27mm b/l, moist conjunctiva, external inspection of ears and nose normal, hearing intact Oropharynx: normal oropharyngeal mucosa, normal dentition Neck: no thyromegaly, trachea midline, no midline cervical tenderness to palpation Chest: breath sounds equal bilaterally, normal respiratory effort, no midline or lateral chest wall tenderness to palpation/deformity Abdomen: soft, mild diffuse TTP, no bruising, no hepatosplenomegaly GU: normal female genitalia  Back: no wounds, no thoracic/lumbar spine tenderness to palpation, no thoracic/lumbar spine stepoffs Rectal: deferred Extremities: 2+ radial and pedal pulses bilaterally, motor and sensation intact to bilateral UE and LE, no peripheral edema, RUE fistula with thrill present MSK: unable to assess gait/station, no clubbing/cyanosis of fingers/toes, normal ROM of all four extremities Skin: warm, dry, no rashes Psych: normal memory, normal mood/affect    Assessment/Plan: 85F with abdominal pain of 1d duration and non-contrasted CT imaging with short segment of SB wall thickening,  distension, fecalization, and fat stranding. Significant leukocytosis and lactic acidosis. Imaging and lab findings could be related to hypoperfusion 2/2 recent HD session vs enteritis. In the absence of peritoneal signs or acute abdominal findings, favor clinical observation, gentle hydration, empiric abx, and bowel rest for now. Okay for sips/chips as long as no n/v. Will continue to follow.   Jesusita Oka, MD General and [facility]

## 2021-08-05 NOTE — Telephone Encounter (Signed)
Critical Lab:   WC 26.8

## 2021-08-05 NOTE — Progress Notes (Signed)
Subjective:  Patient ID: [name], female    DOB: [birth year]  Age: 70 y.o. MRN: [id number]  CC: Office Visit (Stomach pain, constipation, temp 100.8, negative home COVID test) and Abdominal Pain  This visit occurred during the SARS-CoV-2 public health emergency.  Safety protocols were in place, including screening questions prior to the visit, additional usage of staff PPE, and extensive cleaning of exam room while observing appropriate contact time as indicated for disinfecting solutions.    HPI LOUAN BASE presents for the complaint of a 1 day history of diffuse abdominal pain with diaphoresis and fever to 100.8.  The symptoms started yesterday while she was in dialysis.  Outpatient Medications Prior to Visit  Medication Sig Dispense Refill   acetaminophen (TYLENOL) 500 MG tablet Take 1,000 mg by mouth every 6 (six) hours as needed for moderate pain or headache.     amoxicillin (AMOXIL) 500 MG capsule TAKE 4 CAPSULES BY MOUTH 1  HOUR PRIOR TO DENTAL  PROCEDURE/CLEANING (Patient taking differently: Take 2,000 mg by mouth See admin instructions. Take 2000 mg 1 hour prior to dental work) 16 capsule 1   amoxicillin (AMOXIL) 500 MG capsule Take 1 capsule (500 mg total) by mouth 3 (three) times daily until gone 15 capsule 0   aspirin-acetaminophen-caffeine (EXCEDRIN MIGRAINE) 250-250-65 MG tablet Excedrin Migraine 250 mg-250 mg-65 mg tablet  Take by oral route.     B Complex-C-Folic Acid (RENA-VITE PO) Take 1 tablet a day     blood glucose meter kit and supplies Dispense based on patient and insurance preference. Used to check blood sugar daily, DX: E11.9 OneTouch 1 each 0   buPROPion (WELLBUTRIN XL) 300 MG 24 hr tablet Take 1 tablet (300 mg total) by mouth at bedtime. 90 tablet 3   cyclobenzaprine (FLEXERIL) 10 MG tablet Take 1 tablet (10 mg total) by mouth 3 (three) times daily as needed for muscle spasms. 270 tablet 0   dicyclomine (BENTYL) 20 MG tablet Take one tablet every 4-6 hours  as needed for abdominal pain (Patient taking differently: Take 20 mg by mouth every 4 (four) hours as needed (abdominal pain).) 60 tablet 2   DULoxetine (CYMBALTA) 60 MG capsule Take 1 capsule (60 mg total) by mouth at bedtime. 90 capsule 3   fexofenadine (ALLEGRA) 180 MG tablet Take 1 tablet (180 mg total) by mouth daily. (Patient taking differently: Take 180 mg by mouth daily as needed for allergies.) 90 tablet 3   glimepiride (AMARYL) 1 MG tablet Take 1 tablet (1 mg total) by mouth daily before breakfast. 90 tablet 3   glucose blood (TRUETEST TEST) test strip Used to check blood sugar daily, DX: E11.9 100 each 3   ketoconazole (NIZORAL) 2 % cream APPLY TO THE AFFECTED AREA(S) BY TOPICAL ROUTE ONCE DAILY 60 g 2   ketoconazole (NIZORAL) 200 MG tablet Take by mouth. Take 1/2 tab by mouth as needed     Lancets (ONETOUCH ULTRASOFT) lancets Used to check blood sugar daily, DX: E11.9 100 each 3   lidocaine (XYLOCAINE) 5 % ointment APPLY TO AFFECTED AREA(S)  TOPICALLY 3 TIMES DAILY AS  NEEDED 106.32 g 3   lidocaine-prilocaine (EMLA) cream SMARTSIG:Sparingly Topical As Directed     LORazepam (ATIVAN) 2 MG tablet TAKE 1 TABLET BY MOUTH ONCE EVERY NIGHT AT BEDTIME AS NEEDED INSOMNIA 90 tablet 2   LORazepam (ATIVAN) 2 MG tablet TAKE 1 TABLET BY MOUTH ONCE EVERY NIGHT AT BEDTIME AS NEEDED INSOMNIA 90 tablet 2   oxyCODONE (  ROXICODONE) 15 MG immediate release tablet Take 1 tablet (15 mg total) by mouth 4 (four) times daily as needed. 120 tablet 0   pantoprazole (PROTONIX) 40 MG tablet Take 1 tablet (40 mg total) by mouth daily. 90 tablet 3   pilocarpine (SALAGEN) 5 MG tablet Take 1 tablet (5 mg total) by mouth 2 (two) times daily. 180 tablet 3   pioglitazone (ACTOS) 15 MG tablet Take 1 tablet (15 mg total) by mouth daily. 90 tablet 3   polyethylene glycol (MIRALAX / GLYCOLAX) 17 g packet Take 17 g by mouth daily as needed (constipation.).     polyvinyl alcohol (LIQUIFILM TEARS) 1.4 % ophthalmic solution Place 1  drop into both eyes daily as needed for dry eyes.     sevelamer carbonate (RENVELA) 800 MG tablet Take 800 mg by mouth 5 (five) times daily. Take 2 tablets tid and 1 tablet if eating a snack     cephALEXin (KEFLEX) 500 MG capsule Take 2 capsules (1,000 mg total) by mouth as directed. 2 capsule 0   No facility-administered medications prior to visit.    ROS Review of Systems  Constitutional:  Positive for appetite change and fever. Negative for diaphoresis and fatigue.  HENT: Negative.    Eyes: Negative.   Respiratory:  Negative for cough, shortness of breath and wheezing.   Gastrointestinal:  Positive for abdominal pain and constipation. Negative for abdominal distention, diarrhea, nausea and vomiting.  Endocrine: Negative.   Genitourinary: Negative.  Negative for difficulty urinating and dysuria.  Musculoskeletal: Negative.  Negative for back pain.  Skin: Negative.  Negative for rash.  Neurological: Negative.  Negative for dizziness, weakness and light-headedness.  Hematological:  Negative for adenopathy. Does not bruise/bleed easily.  Psychiatric/Behavioral: Negative.     Objective:  BP (!) 112/58 (BP Location: Left Arm, Patient Position: Sitting, Cuff Size: Large)   Pulse 95   Temp 98.5 F (36.9 C) (Oral)   Resp 16   Ht $R'5\' 2"'Fn$  (1.575 m)   Wt 217 lb 3.2 oz (98.5 kg)   SpO2 95%   BMI 39.73 kg/m   BP Readings from Last 3 Encounters:  08/05/21 (!) 112/58  06/17/21 126/62  03/04/21 122/78    Wt Readings from Last 3 Encounters:  08/05/21 217 lb 3.2 oz (98.5 kg)  06/17/21 214 lb (97.1 kg)  03/04/21 220 lb 14.4 oz (100.2 kg)    Physical Exam Vitals reviewed.  Constitutional:      Appearance: She is ill-appearing.  Eyes:     General: No scleral icterus. Cardiovascular:     Rate and Rhythm: Normal rate and regular rhythm.     Heart sounds: No murmur heard. Pulmonary:     Effort: Pulmonary effort is normal.     Breath sounds: No stridor. No wheezing, rhonchi or rales.   Abdominal:     General: Abdomen is protuberant. Bowel sounds are increased. There is no distension.     Palpations: There is no hepatomegaly, splenomegaly or mass.     Tenderness: There is abdominal tenderness in the right upper quadrant, epigastric area and left upper quadrant. There is no guarding or rebound.  Musculoskeletal:        General: Normal range of motion.     Cervical back: Neck supple.     Right lower leg: No edema.     Left lower leg: No edema.  Skin:    General: Skin is warm and dry.  Neurological:     General: No focal deficit present.  Mental Status: She is alert.    Lab Results  Component Value Date   WBC 26.8 Repeated and verified X2. (HH) 08/05/2021   HGB 11.0 (L) 08/05/2021   HCT 33.6 (L) 08/05/2021   PLT 198.0 08/05/2021   GLUCOSE 146 (H) 12/15/2020   CHOL 215 (H) 09/25/2014   TRIG 154.0 (H) 09/25/2014   HDL 46.00 09/25/2014   LDLCALC 138 (H) 09/25/2014   ALT 13 12/15/2020   AST 13 12/15/2020   NA 138 12/15/2020   K 3.5 12/15/2020   CL 98 12/15/2020   CREATININE 2.40 (H) 12/15/2020   BUN 25 (H) 12/15/2020   CO2 31 12/15/2020   TSH 3.98 04/16/2020   INR 1.04 02/26/2015   HGBA1C 6.0 12/15/2020    MM DIAG BREAST TOMO BILATERAL  Result Date: 05/13/2021 CLINICAL DATA:  70 year old female for 1 year follow-up of 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 EXAM: DIGITAL DIAGNOSTIC BILATERAL MAMMOGRAM WITH CAD AND TOMO COMPARISON:  Previous exam(s). ACR Breast Density Category a: The breast tissue is almost entirely fatty. FINDINGS: 2D/3D full field views of both breasts demonstrate interval resolution of the UPPER INNER LEFT breast asymmetry, compatible with resolved fat necrosis. No new or suspicious findings are noted within either breast. Mammographic images were processed with CAD. IMPRESSION: 1. LEFT breast asymmetry no longer visualized, compatible with resolved fat necrosis changes. 2. No evidence of breast malignancy.  RECOMMENDATION: Bilateral screening mammogram in 1 year. I have discussed the findings and recommendations with the patient. If applicable, a reminder letter will be sent to the patient regarding the next appointment. BI-RADS CATEGORY  1: Negative. Electronically Signed   By: Margarette Canada M.D.   On: 05/13/2021 11:28   DG ABD ACUTE 2+V W 1V CHEST  Result Date: 08/05/2021 CLINICAL DATA:  Abdominal pain, fever, previous tobacco abuse EXAM: DG ABDOMEN ACUTE WITH 1 VIEW CHEST COMPARISON:  02/22/2018 FINDINGS: Supine and upright frontal views of the abdomen and pelvis are obtained, excluding the flanks by collimation. Upright frontal view of the chest was also performed. Cardiac silhouette is unremarkable. No airspace disease, effusion, or pneumothorax. Bowel gas pattern is unremarkable without obstruction or ileus. There is moderate stool throughout the colon. No masses or abnormal calcifications. No free gas in the greater peritoneal sac. Left convex scoliosis centered in the upper lumbar spine. Diffuse multilevel thoracolumbar spondylosis. IMPRESSION: 1. Moderate retained stool throughout the colon. 2. No bowel obstruction or ileus. 3. No acute intrathoracic process. Electronically Signed   By: Randa Ngo M.D.   On: 08/05/2021 15:30     Assessment & Plan:   August was seen today for office visit and abdominal pain.  Diagnoses and all orders for this visit:  Generalized abdominal tenderness without rebound tenderness- Her plain films only revealed constipation but her symptoms, exam, and elevated white cell count are very concerning for an intra-abdominal infectious process.  I have advised her to go to the emergency department. -     DG ABD ACUTE 2+V W 1V CHEST; Future -     CBC with Differential/Platelet; Future -     Basic metabolic panel; Future -     Lipase; Future -     Amylase; Future -     Urinalysis, Routine w reflex microscopic; Future -     Hepatic function panel; Future -     Hepatic  function panel -     Urinalysis, Routine w reflex microscopic -     Amylase -  Lipase -     Basic metabolic panel -     CBC with Differential/Platelet  Fever in adult- See above. -     CBC with Differential/Platelet; Future -     Basic metabolic panel; Future -     Lipase; Future -     Amylase; Future -     Urinalysis, Routine w reflex microscopic; Future -     Hepatic function panel; Future -     Hepatic function panel -     Urinalysis, Routine w reflex microscopic -     Amylase -     Lipase -     Basic metabolic panel -     CBC with Differential/Platelet  I have discontinued Javia R. Franklyn's cephALEXin. I am also having her maintain her polyvinyl alcohol, onetouch ultrasoft, blood glucose meter kit and supplies, dicyclomine, amoxicillin, fexofenadine, TRUEtest Test, acetaminophen, polyethylene glycol, buPROPion, glimepiride, pilocarpine, pioglitazone, sevelamer carbonate, lidocaine, B Complex-C-Folic Acid (RENA-VITE PO), Excedrin Migraine, lidocaine-prilocaine, DULoxetine, pantoprazole, LORazepam, cyclobenzaprine, ketoconazole, ketoconazole, amoxicillin, oxyCODONE, and LORazepam.  No orders of the defined types were placed in this encounter.    Follow-up: Return in about 3 days (around 08/08/2021).  [name], MD

## 2021-08-05 NOTE — Telephone Encounter (Signed)
CRITICAL VALUE STICKER  CRITICAL VALUE: GFR 9.48  RECEIVER (on-site recipient of call): Lovena Le A. Roseanne Juenger   DATE & TIME NOTIFIED: 08/05/21 4:55p  MESSENGER (representative from lab): Saa  MD NOTIFIED: Dr. Ronnald Ramp  TIME OF NOTIFICATION: 5:00p  Patient is headed to ED for elevated WBC

## 2021-08-05 NOTE — Patient Instructions (Signed)

## 2021-08-05 NOTE — H&P (Signed)
History and Physical   TASHIANNA BROOME JJO:[id number] DOB: [birth year] DOA: 08/05/2021  PCP: Cassandria Anger, MD   Patient coming from: PCP office  Chief Complaint: Abdominal pain, elevated white count  HPI: [name] is a 70 y.o. female with medical history significant of anemia, anxiety, depression, venous insufficiency, colitis, IBS, DDD, diabetes, ESRD on HD TTS, hypertension, GERD, hyperlipidemia, chronic pain, memory problems, benign brain tremor presents from PCP office with ongoing abdominal pain and elevated white count.  Patient states that she has had abdominal pain that started during her dialysis session yesterday.  When the pain started she was noted to have a low blood pressure but blood pressure improved with a small bolus per her report.  She continued to have abdominal pain since that time.  Also noted a fever to 100.8.  She went to see her PCP today due to this persistent pain.  Found to have a leukocytosis to 26 and sent to the ED for further evaluation.  Reports some mild constipation but states she has been passing gas.  She denies increase in pain with eating.  She denies chills, chest pain, shortness of breath, diarrhea, nausea, vomiting.  ED Course: Vital signs in the ED significant for blood pressure in the 16W to 109 systolic.  Lab work-up showed CMP with sodium 131, chloride 89, BUN 42, creatinine 4.76 which is stable and her ESRD, bicarb normal with gap of 17, glucose 142.  CBC with leukocytosis to 25.2 and hemoglobin stable at 11.7.  Lactic acid was elevated to 3.5 with repeat pending.  Lipase and amylase normal at her PCP office and lipase normal here.  Urinalysis pending.  Chest x-ray showing mild left basilar atelectasis which is new and abdominal x-ray done in her doctors office with moderate stool retention.  CT abdomen pelvis here showed enteritis infectious versus inflammatory this was done without contrast.  CT also showed diverticula Kalosis without  diverticulitis and an umbilical hernia.  Due to elevated lactic acid concern for possible ischemic colitis prompted consult to general surgery, Dr. Helaine Chess, who stated they will see the patient.  Patient started on Zosyn and given a dose of morphine in the ED.  Also received a 500 cc bolus.  Review of Systems: As per HPI otherwise all other systems reviewed and are negative.  Past Medical History:  Diagnosis Date   Anemia    Anxiety    Brain tumor (benign) ([place])    Colitis 2010   microscopic- Dr Henrene Pastor   Depression    Diabetes mellitus    type II   Dyspnea    with exertion   ESRD (end stage renal disease) ([place])    TTUSAT [address]    Fibromyalgia    GERD (gastroesophageal reflux disease)    Headache    History of blood transfusion    after knee surgery   Hypertension    discontinued all diuretics and antihypertensives   IBS (irritable bowel syndrome)    LBP (low back pain)    Neuropathy    feet bilat    Osteoarthritis    Osteopenia    Pneumonia    hx of 2014    Rotator cuff tear, right    Sinusitis    currently being treated with antibiotic will complete 03/04/2015    Past Surgical History:  Procedure Laterality Date   AV FISTULA PLACEMENT Right 09/12/2020   Procedure: RIGHT ARTERIOVENOUS (AV) FISTULA CREATION;  Surgeon: Elam Dutch, MD;  Location: [facility];  Service: Vascular;  Laterality: Right;   BASCILIC VEIN TRANSPOSITION Right 10/27/2020   Procedure: RIGHT UPPER EXTREMITY SECOND STAGE BASCILIC VEIN TRANSPOSITION;  Surgeon: Elam Dutch, MD;  Location[facility];  Service: Vascular;  Laterality: Right;   foramen magnum ependymoma surgery  2003   Dr Rita Ohara   JOINT REPLACEMENT Bilateral    NASAL SINUS SURGERY     1973    TONSILLECTOMY     TOTAL KNEE ARTHROPLASTY     L 2008, R 2009, R 2016- Dr Maureen Ralphs   TOTAL KNEE REVISION Right 03/05/2015   Procedure: RIGHT TOTAL KNEE ARTHROPLASTY REVISION;  Surgeon: Gaynelle Arabian, MD;  Location: [facility];  Service:  Orthopedics;  Laterality: Right;    Social History  reports that she quit smoking about 32 years ago. Her smoking use included cigarettes. She has a 20.00 pack-year smoking history. She has never used smokeless tobacco. She reports that she does not currently use alcohol. She reports that she does not use drugs.  Allergies  Allergen Reactions   Erythromycin Diarrhea and Nausea And Vomiting   Gabapentin     Confusion and falling     Family History  Problem Relation Age of Onset   Depression Mother    Hypertension Mother    Stroke Mother 68   Diabetes Mother    Diabetes Father    Hyperlipidemia Father    Hypertension Father    Crohn's disease Maternal Uncle    Diabetes Other    Cancer Brother        Prostate   Diabetes Brother    Heart disease Brother    Hyperlipidemia Brother    Hypertension Brother    Diabetes Brother    Heart disease Brother        Heart Disease before age 38   Heart attack Brother    Stroke Brother        X's 2   Hyperlipidemia Brother    Hypertension Brother    Colon cancer Neg Hx   Reviewed on admission  Prior to Admission medications   Medication Sig Start Date End Date Taking? Authorizing Provider  acetaminophen (TYLENOL) 500 MG tablet Take 1,000 mg by mouth every 6 (six) hours as needed for moderate pain or headache.    [provider]  amoxicillin (AMOXIL) 500 MG capsule TAKE 4 CAPSULES BY MOUTH 1  HOUR PRIOR TO DENTAL  PROCEDURE/CLEANING Patient taking differently: Take 2,000 mg by mouth See admin instructions. Take 2000 mg 1 hour prior to dental work 04/16/20   Plotnikov, Evie Lacks, MD  amoxicillin (AMOXIL) 500 MG capsule Take 1 capsule (500 mg total) by mouth 3 (three) times daily until gone 06/19/21     aspirin-acetaminophen-caffeine (EXCEDRIN MIGRAINE) 250-250-65 MG tablet Excedrin Migraine 250 mg-250 mg-65 mg tablet  Take by oral route.    [provider]  B Complex-C-Folic Acid (RENA-VITE PO) Take 1 tablet a day     [provider]  blood glucose meter kit and supplies Dispense based on patient and insurance preference. Used to check blood sugar daily, DX: E11.9 OneTouch 12/20/17   Plotnikov, Evie Lacks, MD  buPROPion (WELLBUTRIN XL) 300 MG 24 hr tablet Take 1 tablet (300 mg total) by mouth at bedtime. 10/29/20   Plotnikov, Evie Lacks, MD  cyclobenzaprine (FLEXERIL) 10 MG tablet Take 1 tablet (10 mg total) by mouth 3 (three) times daily as needed for muscle spasms. 04/09/21   Plotnikov, Evie Lacks, MD  dicyclomine (BENTYL) 20 MG tablet Take one tablet  every 4-6 hours as needed for abdominal pain Patient taking differently: Take 20 mg by mouth every 4 (four) hours as needed (abdominal pain). 01/30/20   Irene Shipper, MD  DULoxetine (CYMBALTA) 60 MG capsule Take 1 capsule (60 mg total) by mouth at bedtime. 12/15/20   Plotnikov, Evie Lacks, MD  fexofenadine (ALLEGRA) 180 MG tablet Take 1 tablet (180 mg total) by mouth daily. Patient taking differently: Take 180 mg by mouth daily as needed for allergies. 08/14/20 08/14/21  Biagio Borg, MD  glimepiride (AMARYL) 1 MG tablet Take 1 tablet (1 mg total) by mouth daily before breakfast. 10/29/20   Plotnikov, Evie Lacks, MD  glucose blood (TRUETEST TEST) test strip Used to check blood sugar daily, DX: E11.9 09/05/20   Plotnikov, Evie Lacks, MD  ketoconazole (NIZORAL) 2 % cream APPLY TO THE AFFECTED AREA(S) BY TOPICAL ROUTE ONCE DAILY 04/20/21     ketoconazole (NIZORAL) 200 MG tablet Take by mouth. Take 1/2 tab by mouth as needed 03/20/21   [provider]  Lancets [facility] ULTRASOFT) lancets Used to check blood sugar daily, DX: E11.9 12/20/17   Plotnikov, Evie Lacks, MD  lidocaine (XYLOCAINE) 5 % ointment APPLY TO AFFECTED AREA(S)  TOPICALLY 3 TIMES DAILY AS  NEEDED 11/25/20   Plotnikov, Evie Lacks, MD  lidocaine-prilocaine (EMLA) cream SMARTSIG:Sparingly Topical As Directed 12/10/20   [provider]  LORazepam (ATIVAN) 2 MG tablet TAKE 1 TABLET BY MOUTH ONCE EVERY  NIGHT AT BEDTIME AS NEEDED INSOMNIA 02/09/21   Plotnikov, Evie Lacks, MD  LORazepam (ATIVAN) 2 MG tablet TAKE 1 TABLET BY MOUTH ONCE EVERY NIGHT AT BEDTIME AS NEEDED INSOMNIA 02/09/21 09/07/21  Plotnikov, Evie Lacks, MD  oxyCODONE (ROXICODONE) 15 MG immediate release tablet Take 1 tablet (15 mg total) by mouth 4 (four) times daily as needed. 08/05/21     pantoprazole (PROTONIX) 40 MG tablet Take 1 tablet (40 mg total) by mouth daily. 12/15/20   Plotnikov, Evie Lacks, MD  pilocarpine (SALAGEN) 5 MG tablet Take 1 tablet (5 mg total) by mouth 2 (two) times daily. 10/29/20   Plotnikov, Evie Lacks, MD  pioglitazone (ACTOS) 15 MG tablet Take 1 tablet (15 mg total) by mouth daily. 10/29/20   Plotnikov, Evie Lacks, MD  polyethylene glycol (MIRALAX / GLYCOLAX) 17 g packet Take 17 g by mouth daily as needed (constipation.).    [provider]  polyvinyl alcohol (LIQUIFILM TEARS) 1.4 % ophthalmic solution Place 1 drop into both eyes daily as needed for dry eyes.    [provider]  sevelamer carbonate (RENVELA) 800 MG tablet Take 800 mg by mouth 5 (five) times daily. Take 2 tablets tid and 1 tablet if eating a snack 11/07/20   [provider]    Physical Exam: Vitals:   08/05/21 1819 08/05/21 2020 08/05/21 2026  BP: 93/74 94/64   Pulse: 85 84   Resp: 16 18   Temp: 97.6 F (36.4 C) 98.3 F (36.8 C)   TempSrc:  Oral   SpO2: 92% 99% 99%  Weight:  98.4 kg   Height:  $Remove'5\' 2"'DHVKEKp$  (1.575 m)    Physical Exam Constitutional:      General: She is not in acute distress.    Appearance: Normal appearance.  HENT:     Head: Normocephalic and atraumatic.     Mouth/Throat:     Mouth: Mucous membranes are moist.     Pharynx: Oropharynx is clear.  Eyes:     Extraocular Movements: Extraocular movements intact.  Pupils: Pupils are equal, round, and reactive to light.  Cardiovascular:     Rate and Rhythm: Normal rate and regular rhythm.     Pulses: Normal pulses.     Heart sounds: Murmur heard.   Pulmonary:     Effort: Pulmonary effort is normal. No respiratory distress.     Breath sounds: Normal breath sounds.  Abdominal:     General: There is no distension.     Palpations: Abdomen is soft.     Tenderness: There is abdominal tenderness (Diffusely).     Comments: Decreased bowl sounds  Musculoskeletal:        General: No swelling or deformity.  Skin:    General: Skin is warm and dry.  Neurological:     General: No focal deficit present.     Mental Status: Mental status is at baseline.   Labs on Admission: I have personally reviewed following labs and imaging studies  CBC: Recent Labs  Lab 08/05/21 1557 08/05/21 1816  WBC 26.8 Repeated and verified X2.* 25.2*  NEUTROABS 22.6* 21.3*  HGB 11.0* 11.7*  HCT 33.6* 36.4  MCV 99.8 104.0*  PLT 198.0 086    Basic Metabolic Panel: Recent Labs  Lab 08/05/21 1557 08/05/21 1816  NA 130* 131*  K 5.0 4.3  CL 87* 89*  CO2 28 25  GLUCOSE 145* 142*  BUN 42* 42*  CREATININE 4.49* 4.76*  CALCIUM 10.3 9.9    GFR: Estimated Creatinine Clearance: 12.2 mL/min (A) (by C-G formula based on SCr of 4.76 mg/dL (H)).  Liver Function Tests: Recent Labs  Lab 08/05/21 1557 08/05/21 1816  AST 21 27  ALT 22 25  ALKPHOS 101 93  BILITOT 0.5 1.0  PROT 7.4 7.3  ALBUMIN 4.0 3.6    Urine analysis:    Component Value Date/Time   COLORURINE YELLOW 08/05/2021 2020   APPEARANCEUR CLOUDY (A) 08/05/2021 2020   LABSPEC 1.020 08/05/2021 2020   PHURINE 5.0 08/05/2021 2020   GLUCOSEU NEGATIVE 08/05/2021 2020   GLUCOSEU NEGATIVE 08/05/2021 1557   [place] 08/05/2021 2020   [place] NEGATIVE 08/05/2021 2020   KETONESUR NEGATIVE 08/05/2021 2020   PROTEINUR 30 (A) 08/05/2021 2020   UROBILINOGEN 0.2 08/05/2021 1557   NITRITE NEGATIVE 08/05/2021 2020   LEUKOCYTESUR SMALL (A) 08/05/2021 2020    Radiological Exams on Admission: CT ABDOMEN PELVIS WO CONTRAST  Result Date: 08/05/2021 CLINICAL DATA:  Abdominal pain and fever.  Elevated white blood cell count. Dialysis patient. EXAM: CT ABDOMEN AND PELVIS WITHOUT CONTRAST TECHNIQUE: Multidetector CT imaging of the abdomen and pelvis was performed following the standard protocol without IV contrast. COMPARISON:  Radiograph earlier today FINDINGS: Lower chest: No acute airspace disease or pleural effusion. Heart is normal in size. Hepatobiliary: There is no focal hepatic abnormality. Mild gallbladder distention without calcified gallstone. No pericholecystic fat stranding. No biliary dilatation. Pancreas: Mild atrophy.  No ductal dilatation or inflammation. Spleen: Normal in size without focal abnormality. Adrenals/Urinary Tract: Minimal left adrenal thickening. No adrenal nodule. Bilateral renal parenchymal atrophy consistent with chronic renal disease. 3.3 cm cyst in the periphery of the left kidney, with additional smaller renal cortical lesions are too small to characterize. No evidence of suspicious solid lesion. Urinary bladder is near completely empty. Stomach/Bowel: Detailed bowel assessment is limited in the absence of enteric contrast. The stomach is nondistended. Short segment of small bowel in the right mid abdomen demonstrates mild distension, fecalized contents, wall thickening and perienteric fat stranding, for example series 3, image 56. this does  not appear to represent a blind-ending tubular structure Meckel's diverticulum, rather a loop of your tight it small bowel. Normal appendix, series 6, image 36. Moderate volume of stool throughout the colon. There is transverse and sigmoid colonic redundancy. Mild sigmoid diverticulosis without focal diverticulitis. No colonic inflammation. Vascular/Lymphatic: Aortic atherosclerosis. No aortic aneurysm. No portal venous or mesenteric gas. Few prominent mesenteric lymph nodes, in the region of small bowel inflammation. There is no bulky abdominopelvic adenopathy. Reproductive: Small calcified uterine fibroids. The uterus is atrophic.  No adnexal mass. Other: Moderate-sized fat containing umbilical hernia. No bowel involvement or inflammation. There is no ascites or free air. Musculoskeletal: Degenerative change in the spine. No focal bone abnormality or acute osseous findings. IMPRESSION: 1. Short segment of small bowel in the right mid abdomen demonstrates mild distension, fecalized contents, wall thickening, and perienteric fat stranding. Findings are suspicious for enteritis, may be infectious or inflammatory. No bowel perforation. 2. Mild sigmoid diverticulosis without focal diverticulitis. 3. Moderate-sized fat containing umbilical hernia. No bowel involvement or inflammation. 4. Sequela of chronic renal disease with renal parenchymal atrophy. Aortic Atherosclerosis (ICD10-I70.0). Electronically Signed   By: Keith Rake M.D.   On: 08/05/2021 18:51   DG Chest Port 1 View  Result Date: 08/05/2021 CLINICAL DATA:  Shortness of breath and fevers, initial encounter EXAM: PORTABLE CHEST 1 VIEW COMPARISON:  03/01/2016 FINDINGS: Cardiac shadow is within normal limits. Lungs are well aerated bilaterally. Patchy left basilar airspace opacity is noted consistent with atelectasis as it was not seen on recent CT. No bony abnormality is noted. IMPRESSION: Mild left basilar atelectasis new from prior CT examination. Electronically Signed   By: Inez Catalina M.D.   On: 08/05/2021 20:21   DG ABD ACUTE 2+V W 1V CHEST  Result Date: 08/05/2021 CLINICAL DATA:  Abdominal pain, fever, previous tobacco abuse EXAM: DG ABDOMEN ACUTE WITH 1 VIEW CHEST COMPARISON:  02/22/2018 FINDINGS: Supine and upright frontal views of the abdomen and pelvis are obtained, excluding the flanks by collimation. Upright frontal view of the chest was also performed. Cardiac silhouette is unremarkable. No airspace disease, effusion, or pneumothorax. Bowel gas pattern is unremarkable without obstruction or ileus. There is moderate stool throughout the colon. No masses or abnormal  calcifications. No free gas in the greater peritoneal sac. Left convex scoliosis centered in the upper lumbar spine. Diffuse multilevel thoracolumbar spondylosis. IMPRESSION: 1. Moderate retained stool throughout the colon. 2. No bowel obstruction or ileus. 3. No acute intrathoracic process. Electronically Signed   By: Randa Ngo M.D.   On: 08/05/2021 15:30    EKG: Not yet performed   Assessment/Plan Principal Problem:   Sepsis ([place]) Active Problems:   Diabetes mellitus type 2, controlled ([place])   HYPERCHOLESTEROLEMIA   Anemia   Anxiety disorder   Depression   Essential hypertension   GERD   LOW BACK PAIN   ESRD (end stage renal disease) on dialysis ([name])   Lumbar spondylosis with myelopathy  Sepsis versus ischemic colitis > Patient with 1 day of abdominal pain started during low blood pressure at dialysis yesterday.  Blood pressure improved with small bolus but abdominal pain has persisted.  Noted to have significant leukocytosis at PCP and sent to the ED. > Leukocytosis confirmed in the ED also noted to have lactic acid of 3.5.  CT showed enteritis possible infectious versus inflammatory this was done without contrast.  Concern for possible ischemic colitis given the severity of her pain and elevated lactic acid. > Started on antibiotics in the  ED and received 500 cc bolus given her ESRD status. > Lower suspicion for ischemic colitis at this time as she does not have severe pain and she states the pain was not worse with eating. - Monitor on progressive unit - Appreciate general surgery recommendations - Continue to trend lactic acid  - Continue Zosyn - Check procalcitonin - Trend fever curve and white count - Bowel rest, n.p.o. except sips with meds, advance to clears as tolerated - Given possible need for surgical intervention hold off on pharmacologic DVT prophylaxis, SCDs for now - Started on a rate of IV fluids after initial 500 cc bolus   ESRD on HD TTS > Last dialysis  session was yesterday > Volume status stable in the ED, will be getting IV fluids given concern for possible sepsis - Will need nephrology consult in the morning  - Continue with home Renvela  Anxiety Depression - Continue home bupropion, duloxetine, Ativan   Chronic pain - Continue home duloxetine, Oxycodone  Diabetes > On oral medications at home - SSI   Anemia > Hemoglobin stable at 11.7 - Continue to trend CBC  IBS History of colitis - Continue to monitor in the setting of possible infectious versus ischemic colitis as above.  Holding off on Bentyl for now.  GERD - Continue home PPI  DVT prophylaxis: SCDs  Code Status:   Full  Family Communication:  None on admission, patient states that her sister who is her POA is up-to-date as she just spoke with her. Disposition Plan:   Patient is from:  Home  Anticipated DC to:  Home  Anticipated DC date:  1 to 3 days  Anticipated DC barriers: None  Consults called:  EDP consulted general surgery, Dr. Viviana Simpler, stated they will see the patient. Admission status:  Observation, progressive  Severity of Illness: The appropriate patient status for this patient is OBSERVATION. Observation status is judged to be reasonable and necessary in order to provide the required intensity of service to ensure the patient's safety. The patient's presenting symptoms, physical exam findings, and initial radiographic and laboratory data in the context of their medical condition is felt to place them at decreased risk for further clinical deterioration. Furthermore, it is anticipated that the patient will be medically stable for discharge from the hospital within 2 midnights of admission. The following factors support the patient status of observation.   " The patient's presenting symptoms include abdominal pain, fever. " The physical exam findings include abdominal pain, murmur, decreased bowl sounds. " The initial radiographic and laboratory data are  Lab work-up showed CMP with sodium 131, chloride 89, BUN 42, creatinine 4.76 which is stable and her ESRD, bicarb normal with gap of 17, glucose 142.  CBC with leukocytosis to 25.2 and hemoglobin stable at 11.7.  Lactic acid was elevated to 3.5 with repeat pending.  Lipase and amylase normal at her PCP office and lipase normal here.  Urinalysis pending.  Chest x-ray showing mild left basilar atelectasis which is new and abdominal x-ray done in her doctors office with moderate stool retention.  CT abdomen pelvis here showed enteritis infectious versus inflammatory this was done without contrast.  CT also showed diverticula Kalosis without diverticulitis and an umbilical hernia.     Marcelyn Bruins MD [facility]  How to contact the [facility] Attending or Consulting provider [place] or covering provider during after hours [place], for this patient?   Check the care team in [facility] and look for a)  attending/consulting [facility] provider listed and b) the [facility] team listed Log into [email or web address] and use [place]'s universal password to access. If you do not have the password, please contact the hospital operator. Locate the [facility] provider you are looking for under [facility] and page to a number that you can be directly reached. If you still have difficulty reaching the provider, please page the [facility] (Director on Call) for the Hospitalists listed on amion for assistance.  08/05/2021, 10:14 PM

## 2021-08-05 NOTE — ED Triage Notes (Signed)
Pt here sent from MD office for elevated white count and fever , wbc 26.8 , pt with c/o abd pain for 2 days

## 2021-08-06 DIAGNOSIS — E1122 Type 2 diabetes mellitus with diabetic chronic kidney disease: Secondary | ICD-10-CM | POA: Diagnosis present

## 2021-08-06 DIAGNOSIS — F32A Depression, unspecified: Secondary | ICD-10-CM | POA: Diagnosis present

## 2021-08-06 DIAGNOSIS — R1013 Epigastric pain: Secondary | ICD-10-CM | POA: Diagnosis not present

## 2021-08-06 DIAGNOSIS — I872 Venous insufficiency (chronic) (peripheral): Secondary | ICD-10-CM | POA: Diagnosis present

## 2021-08-06 DIAGNOSIS — M5030 Other cervical disc degeneration, unspecified cervical region: Secondary | ICD-10-CM | POA: Diagnosis present

## 2021-08-06 DIAGNOSIS — K529 Noninfective gastroenteritis and colitis, unspecified: Secondary | ICD-10-CM | POA: Diagnosis not present

## 2021-08-06 DIAGNOSIS — N2581 Secondary hyperparathyroidism of renal origin: Secondary | ICD-10-CM | POA: Diagnosis present

## 2021-08-06 DIAGNOSIS — Z992 Dependence on renal dialysis: Secondary | ICD-10-CM | POA: Diagnosis not present

## 2021-08-06 DIAGNOSIS — F419 Anxiety disorder, unspecified: Secondary | ICD-10-CM | POA: Diagnosis present

## 2021-08-06 DIAGNOSIS — J309 Allergic rhinitis, unspecified: Secondary | ICD-10-CM | POA: Diagnosis present

## 2021-08-06 DIAGNOSIS — N186 End stage renal disease: Secondary | ICD-10-CM | POA: Diagnosis not present

## 2021-08-06 DIAGNOSIS — Z6839 Body mass index (BMI) 39.0-39.9, adult: Secondary | ICD-10-CM | POA: Diagnosis not present

## 2021-08-06 DIAGNOSIS — I120 Hypertensive chronic kidney disease with stage 5 chronic kidney disease or end stage renal disease: Secondary | ICD-10-CM | POA: Diagnosis not present

## 2021-08-06 DIAGNOSIS — N250 Renal osteodystrophy: Secondary | ICD-10-CM | POA: Diagnosis not present

## 2021-08-06 DIAGNOSIS — N269 Renal sclerosis, unspecified: Secondary | ICD-10-CM | POA: Diagnosis not present

## 2021-08-06 DIAGNOSIS — D631 Anemia in chronic kidney disease: Secondary | ICD-10-CM | POA: Diagnosis present

## 2021-08-06 DIAGNOSIS — E872 Acidosis: Secondary | ICD-10-CM | POA: Diagnosis present

## 2021-08-06 DIAGNOSIS — Z20822 Contact with and (suspected) exposure to covid-19: Secondary | ICD-10-CM | POA: Diagnosis present

## 2021-08-06 DIAGNOSIS — E669 Obesity, unspecified: Secondary | ICD-10-CM | POA: Diagnosis present

## 2021-08-06 DIAGNOSIS — M797 Fibromyalgia: Secondary | ICD-10-CM | POA: Diagnosis present

## 2021-08-06 DIAGNOSIS — Z96653 Presence of artificial knee joint, bilateral: Secondary | ICD-10-CM | POA: Diagnosis present

## 2021-08-06 DIAGNOSIS — R109 Unspecified abdominal pain: Secondary | ICD-10-CM | POA: Diagnosis not present

## 2021-08-06 DIAGNOSIS — I959 Hypotension, unspecified: Secondary | ICD-10-CM | POA: Diagnosis not present

## 2021-08-06 DIAGNOSIS — E7800 Pure hypercholesterolemia, unspecified: Secondary | ICD-10-CM | POA: Diagnosis present

## 2021-08-06 DIAGNOSIS — A419 Sepsis, unspecified organism: Secondary | ICD-10-CM | POA: Diagnosis present

## 2021-08-06 DIAGNOSIS — M4716 Other spondylosis with myelopathy, lumbar region: Secondary | ICD-10-CM | POA: Diagnosis present

## 2021-08-06 DIAGNOSIS — M8580 Other specified disorders of bone density and structure, unspecified site: Secondary | ICD-10-CM | POA: Diagnosis present

## 2021-08-06 DIAGNOSIS — K219 Gastro-esophageal reflux disease without esophagitis: Secondary | ICD-10-CM | POA: Diagnosis present

## 2021-08-06 DIAGNOSIS — M810 Age-related osteoporosis without current pathological fracture: Secondary | ICD-10-CM | POA: Diagnosis present

## 2021-08-06 DIAGNOSIS — R1084 Generalized abdominal pain: Secondary | ICD-10-CM | POA: Diagnosis present

## 2021-08-06 LAB — GLUCOSE, CAPILLARY
Glucose-Capillary: 119 mg/dL — ABNORMAL HIGH (ref 70–99)
Glucose-Capillary: 76 mg/dL (ref 70–99)

## 2021-08-06 LAB — COMPREHENSIVE METABOLIC PANEL
ALT: 20 U/L (ref 0–44)
AST: 22 U/L (ref 15–41)
Albumin: 3.1 g/dL — ABNORMAL LOW (ref 3.5–5.0)
Alkaline Phosphatase: 82 U/L (ref 38–126)
Anion gap: 13 (ref 5–15)
BUN: 46 mg/dL — ABNORMAL HIGH (ref 8–23)
CO2: 26 mmol/L (ref 22–32)
Calcium: 9.3 mg/dL (ref 8.9–10.3)
Chloride: 91 mmol/L — ABNORMAL LOW (ref 98–111)
Creatinine, Ser: 4.92 mg/dL — ABNORMAL HIGH (ref 0.44–1.00)
GFR, Estimated: 9 mL/min — ABNORMAL LOW (ref >60)
Glucose, Bld: 100 mg/dL — ABNORMAL HIGH (ref 70–99)
Potassium: 5 mmol/L (ref 3.5–5.1)
Sodium: 130 mmol/L — ABNORMAL LOW (ref 135–145)
Total Bilirubin: 0.8 mg/dL (ref 0.3–1.2)
Total Protein: 6.4 g/dL — ABNORMAL LOW (ref 6.5–8.1)

## 2021-08-06 LAB — SARS CORONAVIRUS 2 (TAT 6-24 HRS): SARS Coronavirus 2: NEGATIVE

## 2021-08-06 LAB — CBG MONITORING, ED: Glucose-Capillary: 63 mg/dL — ABNORMAL LOW (ref 70–99)

## 2021-08-06 LAB — LACTIC ACID, PLASMA: Lactic Acid, Venous: 1.3 mmol/L (ref 0.5–1.9)

## 2021-08-06 LAB — CBC
HCT: 34.4 % — ABNORMAL LOW (ref 36.0–46.0)
Hemoglobin: 10.7 g/dL — ABNORMAL LOW (ref 12.0–15.0)
MCH: 33.6 pg (ref 26.0–34.0)
MCHC: 31.1 g/dL (ref 30.0–36.0)
MCV: 108.2 fL — ABNORMAL HIGH (ref 80.0–100.0)
Platelets: 148 10*3/uL — ABNORMAL LOW (ref 150–400)
RBC: 3.18 MIL/uL — ABNORMAL LOW (ref 3.87–5.11)
RDW: 13.3 % (ref 11.5–15.5)
WBC: 22.5 10*3/uL — ABNORMAL HIGH (ref 4.0–10.5)
nRBC: 0 % (ref 0.0–0.2)

## 2021-08-06 LAB — PROCALCITONIN: Procalcitonin: 1.69 ng/mL

## 2021-08-06 LAB — PROTIME-INR
INR: 1.1 (ref 0.8–1.2)
Prothrombin Time: 13.8 seconds (ref 11.4–15.2)

## 2021-08-06 LAB — CORTISOL-AM, BLOOD: Cortisol - AM: 12.9 ug/dL (ref 6.7–22.6)

## 2021-08-06 LAB — HIV ANTIBODY (ROUTINE TESTING W REFLEX): HIV Screen 4th Generation wRfx: NONREACTIVE

## 2021-08-06 MED ORDER — PILOCARPINE HCL 5 MG PO TABS
5.0000 mg | ORAL_TABLET | Freq: Two times a day (BID) | ORAL | Status: DC
  Administered 2021-08-06 – 2021-08-11 (×10): 5 mg via ORAL
  Filled 2021-08-06 (×13): qty 1

## 2021-08-06 MED ORDER — DULOXETINE HCL 60 MG PO CPEP
60.0000 mg | ORAL_CAPSULE | Freq: Every day | ORAL | Status: DC
  Administered 2021-08-06 – 2021-08-10 (×5): 60 mg via ORAL
  Filled 2021-08-06 (×5): qty 1

## 2021-08-06 MED ORDER — HEPARIN SODIUM (PORCINE) 5000 UNIT/ML IJ SOLN
5000.0000 [IU] | Freq: Three times a day (TID) | INTRAMUSCULAR | Status: DC
  Administered 2021-08-06 – 2021-08-11 (×14): 5000 [IU] via SUBCUTANEOUS
  Filled 2021-08-06 (×14): qty 1

## 2021-08-06 MED ORDER — POLYETHYLENE GLYCOL 3350 17 G PO PACK
17.0000 g | PACK | Freq: Every day | ORAL | Status: DC | PRN
  Administered 2021-08-06 – 2021-08-09 (×4): 17 g via ORAL
  Filled 2021-08-06 (×4): qty 1

## 2021-08-06 MED ORDER — CALCITRIOL 0.25 MCG PO CAPS
1.5000 ug | ORAL_CAPSULE | ORAL | Status: DC
  Administered 2021-08-07: 1.5 ug via ORAL
  Filled 2021-08-06: qty 6

## 2021-08-06 MED ORDER — LORAZEPAM 1 MG PO TABS
2.0000 mg | ORAL_TABLET | Freq: Every evening | ORAL | Status: DC | PRN
  Administered 2021-08-07 – 2021-08-10 (×3): 2 mg via ORAL
  Filled 2021-08-06 (×3): qty 2

## 2021-08-06 MED ORDER — SEVELAMER CARBONATE 800 MG PO TABS
1600.0000 mg | ORAL_TABLET | Freq: Three times a day (TID) | ORAL | Status: DC
  Administered 2021-08-08 – 2021-08-11 (×9): 1600 mg via ORAL
  Filled 2021-08-06 (×10): qty 2

## 2021-08-06 MED ORDER — CALCITRIOL 0.25 MCG PO CAPS: 1.5000 ug | ORAL_CAPSULE | ORAL | Status: DC

## 2021-08-06 MED ORDER — DICYCLOMINE HCL 10 MG PO CAPS
10.0000 mg | ORAL_CAPSULE | Freq: Three times a day (TID) | ORAL | Status: DC
  Administered 2021-08-06 – 2021-08-11 (×14): 10 mg via ORAL
  Filled 2021-08-06 (×18): qty 1

## 2021-08-06 MED ORDER — DARBEPOETIN ALFA 60 MCG/0.3ML IJ SOSY
60.0000 ug | PREFILLED_SYRINGE | INTRAMUSCULAR | Status: DC
  Filled 2021-08-06: qty 0.3

## 2021-08-06 MED ORDER — BUPROPION HCL ER (XL) 150 MG PO TB24
300.0000 mg | ORAL_TABLET | Freq: Every day | ORAL | Status: DC
  Administered 2021-08-07 – 2021-08-10 (×4): 300 mg via ORAL
  Filled 2021-08-06: qty 1
  Filled 2021-08-06 (×4): qty 2

## 2021-08-06 NOTE — Progress Notes (Signed)
Subjective: CC: Patient reports that her generalized abdominal pain that is worse on the right side was resolved this morning until she got up to walk to the restroom. This caused her pain to return similarly to the symptoms she was experiencing yesterday. She is able to go from standing position and get herself into bed without worsening of her abdominal pain. She denies any n/v. No flatus or bm. Notes she has been dealing with constipation leading up to hospitalization. BP soft overnight which is where she reports she runs normally. Improved this am with last pressure of 133/49. No tachycardia. Her last colonoscopy in 2016 by [name] was reviewed.   Objective: Vital signs in last 24 hours: Temp:  [97.6 F (36.4 C)-98.5 F (36.9 C)] 98.3 F (36.8 C) (08/24 2020) Pulse Rate:  [70-95] 87 (08/25 0745) Resp:  [15-18] 18 (08/25 0700) BP: (84-133)/(36-74) 133/49 (08/25 0800) SpO2:  [86 %-100 %] 95 % (08/25 0745) Weight:  [98.4 kg-98.5 kg] 98.4 kg (08/24 2020)    Intake/Output from previous day: 08/24 0701 - 08/25 0700 In: 500 [IV [id number]] Out: -  Intake/Output this shift: No intake/output data recorded.  PE: Gen:  Alert, NAD, pleasant Card:  RRR Pulm:  Normal rate and effort  Abd: Soft, mild distension, mild diffuse ttp without rebound, rigidity or guarding. +BS. Was not able to palpate umbilical hernia that was noted on CT scan - this was fat containing on CT scan 8/24 Psych: A&Ox3  Skin: no rashes noted, warm and dry  Lab Results:  Recent Labs    08/05/21 1816 08/06/21 0139  WBC 25.2* 22.5*  HGB 11.7* 10.7*  HCT 36.4 34.4*  PLT 181 148*   BMET Recent Labs    08/05/21 1816 08/06/21 0139  NA 131* 130*  K 4.3 5.0  CL 89* 91*  CO2 25 26  GLUCOSE 142* 100*  BUN 42* 46*  CREATININE 4.76* 4.92*  CALCIUM 9.9 9.3   PT/INR Recent Labs    08/06/21 0139  LABPROT 13.8  INR 1.1   CMP     Component Value Date/Time   NA 130 (L) 08/06/2021 0139   K 5.0  08/06/2021 0139   CL 91 (L) 08/06/2021 0139   CO2 26 08/06/2021 0139   GLUCOSE 100 (H) 08/06/2021 0139   BUN 46 (H) 08/06/2021 0139   CREATININE 4.92 (H) 08/06/2021 0139   CALCIUM 9.3 08/06/2021 0139   PROT 6.4 (L) 08/06/2021 0139   ALBUMIN 3.1 (L) 08/06/2021 0139   AST 22 08/06/2021 0139   ALT 20 08/06/2021 0139   ALKPHOS 82 08/06/2021 0139   BILITOT 0.8 08/06/2021 0139   GFRNONAA 9 (L) 08/06/2021 0139   GFRAA 66 (L) 03/07/2015 0449   Lipase     Component Value Date/Time   LIPASE 20 08/05/2021 1816    Studies/Results: CT ABDOMEN PELVIS WO CONTRAST  Result Date: 08/05/2021 CLINICAL DATA:  Abdominal pain and fever. Elevated white blood cell count. Dialysis patient. EXAM: CT ABDOMEN AND PELVIS WITHOUT CONTRAST TECHNIQUE: Multidetector CT imaging of the abdomen and pelvis was performed following the standard protocol without IV contrast. COMPARISON:  Radiograph earlier today FINDINGS: Lower chest: No acute airspace disease or pleural effusion. Heart is normal in size. Hepatobiliary: There is no focal hepatic abnormality. Mild gallbladder distention without calcified gallstone. No pericholecystic fat stranding. No biliary dilatation. Pancreas: Mild atrophy.  No ductal dilatation or inflammation. Spleen: Normal in size without focal abnormality. Adrenals/Urinary Tract: Minimal left adrenal thickening.  No adrenal nodule. Bilateral renal parenchymal atrophy consistent with chronic renal disease. 3.3 cm cyst in the periphery of the left kidney, with additional smaller renal cortical lesions are too small to characterize. No evidence of suspicious solid lesion. Urinary bladder is near completely empty. Stomach/Bowel: Detailed bowel assessment is limited in the absence of enteric contrast. The stomach is nondistended. Short segment of small bowel in the right mid abdomen demonstrates mild distension, fecalized contents, wall thickening and perienteric fat stranding, for example series 3, image 56.  this does not appear to represent a blind-ending tubular structure Meckel's diverticulum, rather a loop of your tight it small bowel. Normal appendix, series 6, image 36. Moderate volume of stool throughout the colon. There is transverse and sigmoid colonic redundancy. Mild sigmoid diverticulosis without focal diverticulitis. No colonic inflammation. Vascular/Lymphatic: Aortic atherosclerosis. No aortic aneurysm. No portal venous or mesenteric gas. Few prominent mesenteric lymph nodes, in the region of small bowel inflammation. There is no bulky abdominopelvic adenopathy. Reproductive: Small calcified uterine fibroids. The uterus is atrophic. No adnexal mass. Other: Moderate-sized fat containing umbilical hernia. No bowel involvement or inflammation. There is no ascites or free air. Musculoskeletal: Degenerative change in the spine. No focal bone abnormality or acute osseous findings. IMPRESSION: 1. Short segment of small bowel in the right mid abdomen demonstrates mild distension, fecalized contents, wall thickening, and perienteric fat stranding. Findings are suspicious for enteritis, may be infectious or inflammatory. No bowel perforation. 2. Mild sigmoid diverticulosis without focal diverticulitis. 3. Moderate-sized fat containing umbilical hernia. No bowel involvement or inflammation. 4. Sequela of chronic renal disease with renal parenchymal atrophy. Aortic Atherosclerosis (ICD10-I70.0). Electronically Signed   By: Keith Rake M.D.   On: 08/05/2021 18:51   DG Chest Port 1 View  Result Date: 08/05/2021 CLINICAL DATA:  Shortness of breath and fevers, initial encounter EXAM: PORTABLE CHEST 1 VIEW COMPARISON:  03/01/2016 FINDINGS: Cardiac shadow is within normal limits. Lungs are well aerated bilaterally. Patchy left basilar airspace opacity is noted consistent with atelectasis as it was not seen on recent CT. No bony abnormality is noted. IMPRESSION: Mild left basilar atelectasis new from prior CT  examination. Electronically Signed   By: Inez Catalina M.D.   On: 08/05/2021 20:21   DG ABD ACUTE 2+V W 1V CHEST  Result Date: 08/05/2021 CLINICAL DATA:  Abdominal pain, fever, previous tobacco abuse EXAM: DG ABDOMEN ACUTE WITH 1 VIEW CHEST COMPARISON:  02/22/2018 FINDINGS: Supine and upright frontal views of the abdomen and pelvis are obtained, excluding the flanks by collimation. Upright frontal view of the chest was also performed. Cardiac silhouette is unremarkable. No airspace disease, effusion, or pneumothorax. Bowel gas pattern is unremarkable without obstruction or ileus. There is moderate stool throughout the colon. No masses or abnormal calcifications. No free gas in the greater peritoneal sac. Left convex scoliosis centered in the upper lumbar spine. Diffuse multilevel thoracolumbar spondylosis. IMPRESSION: 1. Moderate retained stool throughout the colon. 2. No bowel obstruction or ileus. 3. No acute intrathoracic process. Electronically Signed   By: Randa Ngo M.D.   On: 08/05/2021 15:30    Anti-infectives: Anti-infectives (From admission, onward)    Start     Dose/Rate Route Frequency Ordered Stop   08/05/21 2200  piperacillin-tazobactam (ZOSYN) IVPB 2.25 g  Status:  Discontinued        2.25 g 100 mL/hr over 30 Minutes Intravenous Every 8 hours 08/05/21 2004 08/05/21 2005   08/05/21 2015  piperacillin-tazobactam (ZOSYN) IVPB 2.25 g  2.25 g 100 mL/hr over 30 Minutes Intravenous Every 8 hours 08/05/21 2005          Assessment/Plan Right sided abdominal pain Lactic acidosis Leukocytosis  - Patient w/ onset of abdominal pain during her dialysis session on 8/23. CT imaging with short segment of SB wall thickening, distension, fecalization, and fat stranding. She had noted leukocytosis and lactic acidosis on admission. Her lactic acidosis has now resolved (3.5>2.4>1.3). WBC slightly improved from 26.8 > 22.5. BP soft overnight but improved this am at 133/49. No fever or  tachycardia. Her abdomen is soft and without peritonitis. Agree w/ Dr. Craig Guess assessment last night - Imaging and lab findings could be related to hypoperfusion 2/2 recent HD session vs enteritis. In the absence of peritoneal signs or acute abdominal findings, favor clinical observation, gentle hydration, empiric abx, and bowel rest for now. Would defer IVF to [facility] and Nephro. Continue serial abdominal exams and monitoring of vitals and labs. We will follow with you.   FEN - NPO, IVF per TRH and Nephro VTE - SCDs, okay with chemical prophylaxis from a general surgery standpoint ID - Zosyn  ESRD on HD HTN DM2 Umbilical hernia - fat containing on CT scan 8/24   LOS: 0 days    Jillyn Ledger , [facility] Surgery 08/06/2021, 8:17 AM Please see Amion for pager number during day hours 7:00am-4:30pm

## 2021-08-06 NOTE — Progress Notes (Signed)
PROGRESS NOTE    [name]  XTK:[id number] DOB: 31-Jul-1951 DOA: 08/05/2021 PCP: Cassandria Anger, MD   No chief complaint on file.  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  She was sent to ED for further evaluation after being found to have leukocytosis in the 20,000s.    Assessment & Plan:   Principal Problem:   Sepsis ([place]) Active Problems:   Diabetes mellitus type 2, controlled ([place])   HYPERCHOLESTEROLEMIA   Anemia   Anxiety disorder   Depression   Essential hypertension   GERD   LOW BACK PAIN   ESRD (end stage renal disease) on dialysis ([name])   Lumbar spondylosis with myelopathy  Enteritis Imaging concerning for infectious vs inflammatory enteritis.  Ischemia on differential. Continue zosyn NPO for now General surgery c/s GI consult, appreciate recs  ESRD Dialysis (TTS) per renal  Sevelamer   Anxiety  Depression Wellbutrin, cymbalta Ativan prn  Chronic Pain Cymbalta Oxycodone prn  T2DM Holding oral meds SSI  Anemia Trend  Hx Colitis Noted, GI c/s as noted above  GERD  PPI  DVT prophylaxis: heparin Code Status: full  Family Communication: none at bedside Disposition:   Status is: Observation  The patient remains OBS appropriate and will d/c before 2 midnights.  Dispo: The patient is from: Home              Anticipated d/c is to: Home              Patient currently is not medically stable to d/c.   Difficult to place patient No       Consultants:  GI General surgery Renal   Procedures:  none  Antimicrobials:  Anti-infectives (From admission, onward)    Start     Dose/Rate Route Frequency Ordered Stop   08/05/21 2200  piperacillin-tazobactam (ZOSYN) IVPB 2.25 g  Status:  Discontinued        2.25 g 100 mL/hr over 30 Minutes  Intravenous Every 8 hours 08/05/21 2004 08/05/21 2005   08/05/21 2015  piperacillin-tazobactam (ZOSYN) IVPB 2.25 g        2.25 g 100 mL/hr over 30 Minutes Intravenous Every 8 hours 08/05/21 2005            Subjective: Asking for some meds C/o continued abdominal discomfort   Objective: Vitals:   08/06/21 0745 08/06/21 0800 08/06/21 0900 08/06/21 1000  BP:  (!) 133/49 (!) 118/57 (!) 120/53  Pulse: 87  85 81  Resp:   17 18  Temp:      TempSrc:      SpO2: 95%  96% 91%  Weight:      Height:        Intake/Output Summary (Last 24 hours) at 08/06/2021 1113 Last data filed at 08/06/2021 1020 Gross per 24 hour  Intake 1530 ml  Output --  Net 1530 ml   Filed Weights   08/05/21 2020  Weight: 98.4 kg    Examination:  General exam: Appears calm and comfortable  Respiratory system: Clear to auscultation. Respiratory effort normal. Cardiovascular system: S1 & S2 heard, RRR.  Gastrointestinal system: diffuse mild abdominal TTP, greatest in RLQ Central nervous system: Alert and oriented. No focal neurological deficits. Extremities: no LEE Skin: No rashes, lesions or ulcers Psychiatry: Judgement and insight appear normal. Mood & affect  appropriate.     Data Reviewed: I have personally reviewed following labs and imaging studies  CBC: Recent Labs  Lab 08/05/21 1557 08/05/21 1816 08/06/21 0139  WBC 26.8 Repeated and verified X2.* 25.2* 22.5*  NEUTROABS 22.6* 21.3*  --   HGB 11.0* 11.7* 10.7*  HCT 33.6* 36.4 34.4*  MCV 99.8 104.0* 108.2*  PLT 198.0 181 148*    Basic Metabolic Panel: Recent Labs  Lab 08/05/21 1557 08/05/21 1816 08/06/21 0139  NA 130* 131* 130*  K 5.0 4.3 5.0  CL 87* 89* 91*  CO2 28 25 26   GLUCOSE 145* 142* 100*  BUN 42* 42* 46*  CREATININE 4.49* 4.76* 4.92*  CALCIUM 10.3 9.9 9.3    GFR: Estimated Creatinine Clearance: 11.8 mL/min (Nissim Fleischer) (by C-G formula based on SCr of 4.92 mg/dL (H)).  Liver Function Tests: Recent Labs  Lab 08/05/21 1557  08/05/21 1816 08/06/21 0139  AST 21 27 22   ALT 22 25 20   ALKPHOS 101 93 82  BILITOT 0.5 1.0 0.8  PROT 7.4 7.3 6.4*  ALBUMIN 4.0 3.6 3.1*    CBG: Recent Labs  Lab 08/06/21 0755  GLUCAP 63*     Recent Results (from the past 240 hour(s))  SARS CORONAVIRUS 2 (TAT 6-24 HRS) Nasopharyngeal Nasopharyngeal Swab     Status: None   Collection Time: 08/05/21  9:17 PM   Specimen: Nasopharyngeal Swab  Result Value Ref Range Status   SARS Coronavirus 2 NEGATIVE NEGATIVE Final    Comment: (NOTE) SARS-CoV-2 target nucleic acids are NOT DETECTED.  The SARS-CoV-2 RNA is generally detectable in upper and lower respiratory specimens during the acute phase of infection. Negative results do not preclude SARS-CoV-2 infection, do not rule out co-infections with other pathogens, and should not be used as the sole basis for treatment or other patient management decisions. Negative results must be combined with clinical observations, patient history, and epidemiological information. The expected result is Negative.  Fact Sheet for Patients: SugarRoll.be  Fact Sheet for Healthcare Providers: https://www.woods-mathews.com/  This test is not yet approved or cleared by the [place] FDA and  has been authorized for detection and/or diagnosis of SARS-CoV-2 by FDA under an Emergency Use Authorization (EUA). This EUA will remain  in effect (meaning this test can be used) for the duration of the COVID-19 declaration under Se ction 564(b)(1) of the Act, 21 U.S.C. section 360bbb-3(b)(1), unless the authorization is terminated or revoked sooner.  Performed at [facility], [place] [address] [place], [place] [address]          Radiology Studies: CT ABDOMEN PELVIS WO CONTRAST  Result Date: 08/05/2021 CLINICAL DATA:  Abdominal pain and fever. Elevated white blood cell count. Dialysis patient. EXAM: CT ABDOMEN AND PELVIS WITHOUT CONTRAST TECHNIQUE:  Multidetector CT imaging of the abdomen and pelvis was performed following the standard protocol without IV contrast. COMPARISON:  Radiograph earlier today FINDINGS: Lower chest: No acute airspace disease or pleural effusion. Heart is normal in size. Hepatobiliary: There is no focal hepatic abnormality. Mild gallbladder distention without calcified gallstone. No pericholecystic fat stranding. No biliary dilatation. Pancreas: Mild atrophy.  No ductal dilatation or inflammation. Spleen: Normal in size without focal abnormality. Adrenals/Urinary Tract: Minimal left adrenal thickening. No adrenal nodule. Bilateral renal parenchymal atrophy consistent with chronic renal disease. 3.3 cm cyst in the periphery of the left kidney, with additional smaller renal cortical lesions are too small to characterize. No evidence of suspicious solid lesion. Urinary bladder is near completely empty. Stomach/Bowel: Detailed  bowel assessment is limited in the absence of enteric contrast. The stomach is nondistended. Short segment of small bowel in the right mid abdomen demonstrates mild distension, fecalized contents, wall thickening and perienteric fat stranding, for example series 3, image 56. this does not appear to represent Deiona Hooper blind-ending tubular structure Meckel's diverticulum, rather [place] loop of your tight it small bowel. Normal appendix, series 6, image 36. Moderate volume of stool throughout the colon. There is transverse and sigmoid colonic redundancy. Mild sigmoid diverticulosis without focal diverticulitis. No colonic inflammation. Vascular/Lymphatic: Aortic atherosclerosis. No aortic aneurysm. No portal venous or mesenteric gas. Few prominent mesenteric lymph nodes, in the region of small bowel inflammation. There is no bulky abdominopelvic adenopathy. Reproductive: Small calcified uterine fibroids. The uterus is atrophic. No adnexal mass. Other: Moderate-sized fat containing umbilical hernia. No bowel involvement or  inflammation. There is no ascites or free air. Musculoskeletal: Degenerative change in the spine. No focal bone abnormality or acute osseous findings. IMPRESSION: 1. Short segment of small bowel in the right mid abdomen demonstrates mild distension, fecalized contents, wall thickening, and perienteric fat stranding. Findings are suspicious for enteritis, may be infectious or inflammatory. No bowel perforation. 2. Mild sigmoid diverticulosis without focal diverticulitis. 3. Moderate-sized fat containing umbilical hernia. No bowel involvement or inflammation. 4. Sequela of chronic renal disease with renal parenchymal atrophy. Aortic Atherosclerosis (ICD10-I70.0). Electronically Signed   By: Keith Rake M.D.   On: 08/05/2021 18:51   DG Chest Port 1 View  Result Date: 08/05/2021 CLINICAL DATA:  Shortness of breath and fevers, initial encounter EXAM: PORTABLE CHEST 1 VIEW COMPARISON:  03/01/2016 FINDINGS: Cardiac shadow is within normal limits. Lungs are well aerated bilaterally. Patchy left basilar airspace opacity is noted consistent with atelectasis as it was not seen on recent CT. No bony abnormality is noted. IMPRESSION: Mild left basilar atelectasis new from prior CT examination. Electronically Signed   By: Inez Catalina M.D.   On: 08/05/2021 20:21   DG ABD ACUTE 2+V W 1V CHEST  Result Date: 08/05/2021 CLINICAL DATA:  Abdominal pain, fever, previous tobacco abuse EXAM: DG ABDOMEN ACUTE WITH 1 VIEW CHEST COMPARISON:  02/22/2018 FINDINGS: Supine and upright frontal views of the abdomen and pelvis are obtained, excluding the flanks by collimation. Upright frontal view of the chest was also performed. Cardiac silhouette is unremarkable. No airspace disease, effusion, or pneumothorax. Bowel gas pattern is unremarkable without obstruction or ileus. There is moderate stool throughout the colon. No masses or abnormal calcifications. No free gas in the greater peritoneal sac. Left convex scoliosis centered in  the upper lumbar spine. Diffuse multilevel thoracolumbar spondylosis. IMPRESSION: 1. Moderate retained stool throughout the colon. 2. No bowel obstruction or ileus. 3. No acute intrathoracic process. Electronically Signed   By: Randa Ngo M.D.   On: 08/05/2021 15:30        Scheduled Meds:  buPROPion  300 mg Oral QHS   DULoxetine  60 mg Oral QHS   insulin aspart  0-6 Units Subcutaneous TID WC   pantoprazole  40 mg Oral Daily   pilocarpine  5 mg Oral BID   sevelamer carbonate  1,600 mg Oral TID WC   sodium chloride flush  3 mL Intravenous Q12H   Continuous Infusions:  piperacillin-tazobactam (ZOSYN)  IV Stopped (08/06/21 0855)     LOS: 0 days    Time spent: over 30 min    Fayrene Helper, MD [facility]   To contact the attending provider between 7A-7P or the covering provider during  after hours 7P-7A, please log into the web site [email or web address] and access using universal [place] password for that web site. If you do not have the password, please call the hospital operator.  08/06/2021, 11:13 AM

## 2021-08-06 NOTE — ED Notes (Signed)
RN aware of BP 

## 2021-08-06 NOTE — Plan of Care (Signed)

## 2021-08-06 NOTE — Consult Note (Signed)
[facility] Renal Consultation Note  Indication for Consultation:  Management of ESRD/hemodialysis; anemia, hypertension/volume and secondary hyperparathyroidism  HPI: [name] is a 70 y.o. female with ESRD on chronic HD TTS [place] presumed secondary diabetes/hypertension HD started Sept 2021, anxiety/depression/IBS/diabetes mellitus type 2/hypertension. Patient presented yesterday to the ER with complaint abdominal pain and febrile illness 200.8 seen by PCP found to have leukocytosis to 26 sent to the ER for evaluation, CT abdomen pelvis showed enteritis versus inflammatory, had elevated lactic acid to 3.5 WBC 25.2, BUN 42 CR 4.76 K 5.0 today 4.3      Past Medical History:  Diagnosis Date   Anemia    Anxiety    Brain tumor (benign) ([place])    Colitis 2010   microscopic- Dr Henrene Pastor   Depression    Diabetes mellitus    type II   Dyspnea    with exertion   ESRD (end stage renal disease) ([place])    TTUSAT [address]    Fibromyalgia    GERD (gastroesophageal reflux disease)    Headache    History of blood transfusion    after knee surgery   Hypertension    discontinued all diuretics and antihypertensives   IBS (irritable bowel syndrome)    LBP (low back pain)    Neuropathy    feet bilat    Osteoarthritis    Osteopenia    Pneumonia    hx of 2014    Rotator cuff tear, right    Sinusitis    currently being treated with antibiotic will complete 03/04/2015    Past Surgical History:  Procedure Laterality Date   AV FISTULA PLACEMENT Right 09/12/2020   Procedure: RIGHT ARTERIOVENOUS (AV) FISTULA CREATION;  Surgeon: Elam Dutch, MD;  Location: [facility];  Service: Vascular;  Laterality: Right;   Bone [place] Right 10/27/2020   Procedure: RIGHT UPPER EXTREMITY SECOND STAGE [place];  Surgeon: Elam Dutch, MD;  Location: [facility];  Service: Vascular;  Laterality: Right;   foramen magnum ependymoma surgery  2003   Dr Rita Ohara    JOINT REPLACEMENT Bilateral    NASAL SINUS SURGERY     1973    TONSILLECTOMY     TOTAL KNEE ARTHROPLASTY     L 2008, R 2009, R 2016- Dr Maureen Ralphs   TOTAL KNEE REVISION Right 03/05/2015   Procedure: RIGHT TOTAL KNEE ARTHROPLASTY REVISION;  Surgeon: Gaynelle Arabian, MD;  Location: [facility];  Service: Orthopedics;  Laterality: Right;      Family History  Problem Relation Age of Onset   Depression Mother    Hypertension Mother    Stroke Mother 67   Diabetes Mother    Diabetes Father    Hyperlipidemia Father    Hypertension Father    Crohn's disease Maternal Uncle    Diabetes Other    Cancer Brother        Prostate   Diabetes Brother    Heart disease Brother    Hyperlipidemia Brother    Hypertension Brother    Diabetes Brother    Heart disease Brother        Heart Disease before age 89   Heart attack Brother    Stroke Brother        X's 2   Hyperlipidemia Brother    Hypertension Brother    Colon cancer Neg Hx       reports that she quit smoking about 32 years ago. Her smoking use included cigarettes. She has  a 20.00 pack-year smoking history. She has never used smokeless tobacco. She reports that she does not currently use alcohol. She reports that she does not use drugs.   Allergies  Allergen Reactions   Erythromycin Diarrhea and Nausea And Vomiting   Gabapentin Other (See Comments)    Confusion and falling     Prior to Admission medications   Medication Sig Start Date End Date Taking? Authorizing Provider  acetaminophen (TYLENOL) 500 MG tablet Take 1,000 mg by mouth every 6 (six) hours as needed for moderate pain or headache.   Yes [provider]  amoxicillin (AMOXIL) 500 MG capsule TAKE 4 CAPSULES BY MOUTH 1  HOUR PRIOR TO DENTAL  PROCEDURE/CLEANING Patient taking differently: Take 2,000 mg by mouth See admin instructions. Take 2000 mg 1 hour prior to dental work 04/16/20  Yes Plotnikov, Evie Lacks, MD  B Complex-C-Folic Acid (RENA-VITE PO) Take 1 tablet a day    Yes [provider]  blood glucose meter kit and supplies Dispense based on patient and insurance preference. Used to check blood sugar daily, DX: E11.9 OneTouch 12/20/17  Yes Plotnikov, Evie Lacks, MD  buPROPion (WELLBUTRIN XL) 300 MG 24 hr tablet Take 1 tablet (300 mg total) by mouth at bedtime. 10/29/20  Yes Plotnikov, Evie Lacks, MD  cyclobenzaprine (FLEXERIL) 10 MG tablet Take 1 tablet (10 mg total) by mouth 3 (three) times daily as needed for muscle spasms. 04/09/21  Yes Plotnikov, Evie Lacks, MD  dicyclomine (BENTYL) 20 MG tablet Take one tablet every 4-6 hours as needed for abdominal pain Patient taking differently: Take 20 mg by mouth every 4 (four) hours as needed (abdominal pain). 01/30/20  Yes Irene Shipper, MD  DULoxetine (CYMBALTA) 60 MG capsule Take 1 capsule (60 mg total) by mouth at bedtime. 12/15/20  Yes Plotnikov, Evie Lacks, MD  fexofenadine (ALLEGRA) 180 MG tablet Take 1 tablet (180 mg total) by mouth daily. Patient taking differently: Take 180 mg by mouth daily as needed for allergies. 08/14/20 08/14/21 Yes Biagio Borg, MD  glimepiride (AMARYL) 1 MG tablet Take 1 tablet (1 mg total) by mouth daily before breakfast. 10/29/20  Yes Plotnikov, Evie Lacks, MD  glucose blood (TRUETEST TEST) test strip Used to check blood sugar daily, DX: E11.9 09/05/20  Yes Plotnikov, Evie Lacks, MD  ketoconazole (NIZORAL) 2 % cream APPLY TO THE AFFECTED AREA(S) BY TOPICAL ROUTE ONCE DAILY 04/20/21  Yes   Lancets (ONETOUCH ULTRASOFT) lancets Used to check blood sugar daily, DX: E11.9 12/20/17  Yes Plotnikov, Evie Lacks, MD  lidocaine (XYLOCAINE) 5 % ointment APPLY TO AFFECTED AREA(S)  TOPICALLY 3 TIMES DAILY AS  NEEDED 11/25/20  Yes Plotnikov, Evie Lacks, MD  lidocaine-prilocaine (EMLA) cream SMARTSIG:Sparingly Topical As Directed 12/10/20  Yes [provider]  LORazepam (ATIVAN) 2 MG tablet TAKE 1 TABLET BY MOUTH ONCE EVERY NIGHT AT BEDTIME AS NEEDED INSOMNIA 02/09/21 09/07/21 Yes Plotnikov, Evie Lacks, MD   oxyCODONE (ROXICODONE) 15 MG immediate release tablet Take 1 tablet (15 mg total) by mouth 4 (four) times daily as needed. Patient taking differently: Take 15 mg by mouth 4 (four) times daily as needed for pain. 08/05/21  Yes   pantoprazole (PROTONIX) 40 MG tablet Take 1 tablet (40 mg total) by mouth daily. 12/15/20  Yes Plotnikov, Evie Lacks, MD  pilocarpine (SALAGEN) 5 MG tablet Take 1 tablet (5 mg total) by mouth 2 (two) times daily. 10/29/20  Yes Plotnikov, Evie Lacks, MD  pioglitazone (ACTOS) 15 MG tablet Take 1 tablet (15  mg total) by mouth daily. 10/29/20  Yes Plotnikov, Evie Lacks, MD  polyethylene glycol (MIRALAX / GLYCOLAX) 17 g packet Take 17 g by mouth daily as needed (constipation.).   Yes [provider]  polyvinyl alcohol (LIQUIFILM TEARS) 1.4 % ophthalmic solution Place 1 drop into both eyes daily as needed for dry eyes.   Yes [provider]  sevelamer carbonate (RENVELA) 800 MG tablet Take 1,600 mg by mouth 3 (three) times daily with meals. Take 2 tablets tid and 1 tablet if eating a snack 11/07/20  Yes [provider]  amoxicillin (AMOXIL) 500 MG capsule Take 1 capsule (500 mg total) by mouth 3 (three) times daily until gone Patient not taking: Reported on 08/05/2021 06/19/21     LORazepam (ATIVAN) 2 MG tablet TAKE 1 TABLET BY MOUTH ONCE EVERY NIGHT AT BEDTIME AS NEEDED INSOMNIA Patient not taking: Reported on 08/05/2021 02/09/21   Plotnikov, Evie Lacks, MD      Results for orders placed or performed during the hospital encounter of 08/05/21 (from the past 48 hour(s))  CBC with Differential     Status: Abnormal   Collection Time: 08/05/21  6:16 PM  Result Value Ref Range   WBC 25.2 (H) 4.0 - 10.5 K/uL    Comment: WHITE COUNT CONFIRMED ON SMEAR   RBC 3.50 (L) 3.87 - 5.11 MIL/uL   Hemoglobin 11.7 (L) 12.0 - 15.0 g/dL   HCT 36.4 36.0 - 46.0 %   MCV 104.0 (H) 80.0 - 100.0 fL   MCH 33.4 26.0 - 34.0 pg   MCHC 32.1 30.0 - 36.0 g/dL   RDW 13.3 11.5 - 15.5 %    Platelets 181 150 - 400 K/uL   nRBC 0.0 0.0 - 0.2 %   Neutrophils Relative % 85 %   Neutro Abs 21.3 (H) 1.7 - 7.7 K/uL   Lymphocytes Relative 7 %   Lymphs Abs 1.9 0.7 - 4.0 K/uL   Monocytes Relative 6 %   Monocytes Absolute 1.6 (H) 0.1 - 1.0 K/uL   Eosinophils Relative 1 %   Eosinophils Absolute 0.2 0.0 - 0.5 K/uL   Basophils Relative 0 %   Basophils Absolute 0.1 0.0 - 0.1 K/uL   Immature Granulocytes 1 %   Abs Immature Granulocytes 0.19 (H) 0.00 - 0.07 K/uL    Comment: Performed at [facility], [address] [place], [place] [address]  Comprehensive metabolic panel     Status: Abnormal   Collection Time: 08/05/21  6:16 PM  Result Value Ref Range   Sodium 131 (L) 135 - 145 mmol/L   Potassium 4.3 3.5 - 5.1 mmol/L   Chloride 89 (L) 98 - 111 mmol/L   CO2 25 22 - 32 mmol/L   Glucose, Bld 142 (H) 70 - 99 mg/dL    Comment: Glucose reference range applies only to samples taken after fasting for at least 8 hours.   BUN 42 (H) 8 - 23 mg/dL   Creatinine, Ser 4.76 (H) 0.44 - 1.00 mg/dL   Calcium 9.9 8.9 - 10.3 mg/dL   Total Protein 7.3 6.5 - 8.1 g/dL   Albumin 3.6 3.5 - 5.0 g/dL   AST 27 15 - 41 U/L   ALT 25 0 - 44 U/L   Alkaline Phosphatase 93 38 - 126 U/L   Total Bilirubin 1.0 0.3 - 1.2 mg/dL   GFR, Estimated 9 (L) >60 mL/min    Comment: (NOTE) Calculated using the CKD-EPI Creatinine Equation (2021)    Anion gap  17 (H) 5 - 15    Comment: Performed at [facility], [place] [address], [place], [place] [address]  Lipase, blood     Status: None   Collection Time: 08/05/21  6:16 PM  Result Value Ref Range   Lipase 20 11 - 51 U/L    Comment: Performed at [facility], [place] [address], [place], [place] [address]  Lactic acid, plasma     Status: Abnormal   Collection Time: 08/05/21  6:23 PM  Result Value Ref Range   Lactic Acid, Venous 3.5 (HH) 0.5 - 1.9 mmol/L    Comment: CRITICAL RESULT CALLED TO, READ BACK BY AND VERIFIED WITH: M.ETIENNE,RN _0  08/05/2021  VANG.J Performed at [facility], [place] [address], [place], [place] [address]   Urinalysis, Routine w reflex microscopic     Status: Abnormal   Collection Time: 08/05/21  8:20 PM  Result Value Ref Range   Color, Urine YELLOW YELLOW   APPearance CLOUDY (A) CLEAR   Specific Gravity, Urine 1.020 1.005 - 1.030   pH 5.0 5.0 - 8.0   Glucose, UA NEGATIVE NEGATIVE mg/dL   Hgb urine dipstick NEGATIVE NEGATIVE   Bilirubin Urine NEGATIVE NEGATIVE   Ketones, ur NEGATIVE NEGATIVE mg/dL   Protein, ur 30 (A) NEGATIVE mg/dL   Nitrite NEGATIVE NEGATIVE   Leukocytes,Ua SMALL (A) NEGATIVE   RBC / HPF 0-5 0 - 5 RBC/hpf   WBC, UA 11-20 0 - 5 WBC/hpf   Bacteria, UA FEW (A) NONE SEEN   Squamous Epithelial / LPF 21-50 0 - 5   Mucus PRESENT    Hyaline Casts, UA PRESENT     Comment: Performed at [facility] [address], [place], [place] [address]  Lactic acid, plasma     Status: Abnormal   Collection Time: 08/05/21  8:23 PM  Result Value Ref Range   Lactic Acid, Venous 2.4 (HH) 0.5 - 1.9 mmol/L    Comment: CRITICAL VALUE NOTED.  VALUE IS CONSISTENT WITH PREVIOUSLY REPORTED AND CALLED VALUE. Performed at [facility], [place] [address], [place], [place] [address]   SARS CORONAVIRUS 2 (TAT 6-24 HRS) Nasopharyngeal Nasopharyngeal Swab     Status: None   Collection Time: 08/05/21  9:17 PM   Specimen: Nasopharyngeal Swab  Result Value Ref Range   SARS Coronavirus 2 NEGATIVE NEGATIVE    Comment: (NOTE) SARS-CoV-2 target nucleic acids are NOT DETECTED.  The SARS-CoV-2 RNA is generally detectable in upper and lower respiratory specimens during the acute phase of infection. Negative results do not preclude SARS-CoV-2 infection, do not rule out co-infections with other pathogens, and should not be used as the sole basis for treatment or other patient management decisions. Negative results must be combined with clinical observations, patient history, and epidemiological information. The  expected result is Negative.  Fact Sheet for Patients: SugarRoll.be  Fact Sheet for Healthcare Providers: https://www.woods-mathews.com/  This test is not yet approved or cleared by the [place] FDA and  has been authorized for detection and/or diagnosis of SARS-CoV-2 by FDA under an Emergency Use Authorization (EUA). This EUA will remain  in effect (meaning this test can be used) for the duration of the COVID-19 declaration under Se ction 564(b)(1) of the Act, 21 U.S.C. section 360bbb-3(b)(1), unless the authorization is terminated or revoked sooner.  Performed at [facility], [place] [address], [place], [place] [address]   Protime-INR     Status: None   Collection Time:  08/06/21  1:39 AM  Result Value Ref Range   Prothrombin Time 13.8 11.4 - 15.2 seconds   INR 1.1 0.8 - 1.2    Comment: (NOTE) INR goal varies based on device and disease states. Performed at [facility], [place] [address], [place], [place] [address]   Cortisol-am, blood     Status: None   Collection Time: 08/06/21  1:39 AM  Result Value Ref Range   Cortisol - AM 12.9 6.7 - 22.6 ug/dL    Comment: Performed at [facility], [place] [address], [place], [place] [address]  Procalcitonin     Status: None   Collection Time: 08/06/21  1:39 AM  Result Value Ref Range   Procalcitonin 1.69 ng/mL    Comment:        Interpretation: PCT > 0.5 ng/mL and <= 2 ng/mL: Systemic infection (sepsis) is possible, but other conditions are known to elevate PCT as well. (NOTE)       Sepsis PCT Algorithm           Lower Respiratory Tract                                      Infection PCT Algorithm    ----------------------------     ----------------------------         PCT < 0.25 ng/mL                PCT < 0.10 ng/mL          Strongly encourage             Strongly discourage   discontinuation of antibiotics    initiation of antibiotics    ----------------------------      -----------------------------       PCT 0.25 - 0.50 ng/mL            PCT 0.10 - 0.25 ng/mL               OR       >80% decrease in PCT            Discourage initiation of                                            antibiotics      Encourage discontinuation           of antibiotics    ----------------------------     -----------------------------         PCT >= 0.50 ng/mL              PCT 0.26 - 0.50 ng/mL                AND       <80% decrease in PCT             Encourage initiation of                                             antibiotics       Encourage continuation           of antibiotics    ----------------------------     -----------------------------  PCT >= 0.50 ng/mL                  PCT > 0.50 ng/mL               AND         increase in PCT                  Strongly encourage                                      initiation of antibiotics    Strongly encourage escalation           of antibiotics                                     -----------------------------                                           PCT <= 0.25 ng/mL                                                 OR                                        > 80% decrease in PCT                                      Discontinue / Do not initiate                                             antibiotics  Performed at [facility], [address], [place], [place] [address]   Comprehensive metabolic panel     Status: Abnormal   Collection Time: 08/06/21  1:39 AM  Result Value Ref Range   Sodium 130 (L) 135 - 145 mmol/L   Potassium 5.0 3.5 - 5.1 mmol/L   Chloride 91 (L) 98 - 111 mmol/L   CO2 26 22 - 32 mmol/L   Glucose, Bld 100 (H) 70 - 99 mg/dL    Comment: Glucose reference range applies only to samples taken after fasting for at least 8 hours.   BUN 46 (H) 8 - 23 mg/dL   Creatinine, Ser 4.92 (H) 0.44 - 1.00 mg/dL   Calcium 9.3 8.9 - 10.3 mg/dL   Total Protein 6.4 (L) 6.5 - 8.1 g/dL   Albumin 3.1 (L)  3.5 - 5.0 g/dL   AST 22 15 - 41 U/L   ALT 20 0 - 44 U/L   Alkaline Phosphatase 82 38 - 126 U/L   Total Bilirubin 0.8 0.3 - 1.2 mg/dL   GFR, Estimated 9 (L) >60 mL/min    Comment: (NOTE) Calculated using the CKD-EPI Creatinine Equation (2021)    Anion gap 13 5 -  15    Comment: Performed at [facility], [place] [address] [place], [place] [address]  CBC     Status: Abnormal   Collection Time: 08/06/21  1:39 AM  Result Value Ref Range   WBC 22.5 (H) 4.0 - 10.5 K/uL   RBC 3.18 (L) 3.87 - 5.11 MIL/uL   Hemoglobin 10.7 (L) 12.0 - 15.0 g/dL   HCT 34.4 (L) 36.0 - 46.0 %   MCV 108.2 (H) 80.0 - 100.0 fL   MCH 33.6 26.0 - 34.0 pg   MCHC 31.1 30.0 - 36.0 g/dL   RDW 13.3 11.5 - 15.5 %   Platelets 148 (L) 150 - 400 K/uL   nRBC 0.0 0.0 - 0.2 %    Comment: Performed at [facility], [place] [address], [place], [place] [address]  Lactic acid, plasma     Status: None   Collection Time: 08/06/21  1:39 AM  Result Value Ref Range   Lactic Acid, Venous 1.3 0.5 - 1.9 mmol/L    Comment: Performed at [facility] [address], [place], [place] [address]  CBG monitoring, ED     Status: Abnormal   Collection Time: 08/06/21  7:55 AM  Result Value Ref Range   Glucose-Capillary 63 (L) 70 - 99 mg/dL    Comment: Glucose reference range applies only to samples taken after fasting for at least 8 hours.  HIV Antibody (routine testing w rflx)     Status: None   Collection Time: 08/06/21  8:00 AM  Result Value Ref Range   HIV Screen 4th Generation wRfx Non Reactive Non Reactive    Comment: Performed at [facility], [place] [address], [place], [place] [address]  Glucose, capillary     Status: Abnormal   Collection Time: 08/06/21 11:25 AM  Result Value Ref Range   Glucose-Capillary 119 (H) 70 - 99 mg/dL    Comment: Glucose reference range applies only to samples taken after fasting for at least 8 hours.    ROS see hpi  Physical Exam: Vitals:   08/06/21 1000 08/06/21 1113  BP: (!)  120/53 (!) 137/57  Pulse: 81 85  Resp: 18 16  Temp:  98 F (36.7 C)  SpO2: 91% 96%     General: Alert, elderly WD, WN Female, NAD HEENT: [place], [place], not icteric, MMM Neck: no jcd Heart: RRR, no mrg Lungs: CTA, nonlabored Abdomen: Obese, NABS, soft , tender diffusely , no rebound, NT Extremities: nopedal edema Skin: warm dry , no overt rash, no pedal ulcers Neuro: Alert ox3, no acute defects appreciatd  Dialysis Access: Pos Bruit RUA AVF  Dialysis Orders: [facility], TTS 4-hour, 97 edw  2K, 2 CA bath, UF profile 2  Heparin 5000  Calcitriol 1.5 mic  Mircera 60 every 2 weeks starting 8/25 Right UA aVF    Assessment/Plan  Abdominal pain/enteritis infectious versus inflammatory enteritis ischemia on differential= work-up per admit ,surgeon and GI consulting on IV Zosyn n.p.o. for now ESRD -HD TTS, K= 5.0, BUN 46 CR 4.9 no excess volume on chest x-ray , no acute need for dialysis tonight =due to emergent pt  load decreased staff /plan for dialysis tomorrow Hypertension/volume  -BP stable, no home BP meds Anemia of ESRD-Hgb 10.7 was to start Mircera 60 today follow-up Hgb trend Metabolic bone disease -Renvela as phosphate binder when taking meals calcium on dialysis corrected calcium stable Anxiety/depression = on meds per admit Type 2 diabetes mellitus= Rx per admit  Ernest Haber, PA-C [facility] [phone] 08/06/2021, 3:26 PM

## 2021-08-06 NOTE — Progress Notes (Signed)
Pt arrived to unit 2w11 from ED. Received report from [employer]. Pt A&O x 4, IV x 2, R AVF. VSS. Pt oriented to unit and room. Call bell given to pt. Provider notified of pt's arrival.

## 2021-08-06 NOTE — Consult Note (Addendum)
Referring Provider:  Dr. Florene Glen, [facility] Primary Care Physician:  Cassandria Anger, MD Primary Gastroenterologist:  Dr.Perry  Reason for Consultation:  Enteritis   HPI: [name] is a 70 y.o. female with past medical history significant for anxiety, depression, venous insufficiency, IBS, degenerative disc disease, diabetes, end-stage renal disease on HD TTS, hypertension, GERD, hyperlipidemia, chronic pain, benign brain tumor who presented from her PCPs office with complaints of ongoing abdominal pain and elevated white blood cell count.  She describes that her abdominal pain began on Tuesday while she was at dialysis.  At that time her blood pressure was also low according to notes from dialysis nursing staff, but the patient states that her blood pressure always goes very low during dialysis.  She then saw her PCP for ongoing pain on Wednesday and was found to have an elevated white blood cell count so sent to the emergency department.  White blood cell count was elevated up to 26.8K.  She had an elevated lactic acid, but then normalized with some hydration.  Was placed on Zosyn and WBC count is down to 22.5 today.    CT scan of the abdomen and pelvis without contrast showed the following:  IMPRESSION: 1. Short segment of small bowel in the right mid abdomen demonstrates mild distension, fecalized contents, wall thickening, and perienteric fat stranding. Findings are suspicious for enteritis, may be infectious or inflammatory. No bowel perforation. 2. Mild sigmoid diverticulosis without focal diverticulitis. 3. Moderate-sized fat containing umbilical hernia. No bowel involvement or inflammation. 4. Sequela of chronic renal disease with renal parenchymal atrophy.   Aortic Atherosclerosis (ICD10-I70.0).  She is still having a lot of abdominal pain although is comfortable and has a long conversation.  She is tearful about having to be on dialysis and how that has interfered with her  plans to continue her world travel.  She says that the dilaudid really does not help much with the pain.  She had taken a dicyclomine at home on Tuesday night and it seemed to help some.  She denies any associated nausea, vomiting, diarrhea.  She tends to be constipated from her phosphorus binders so takes Miralax for that.  Had a temp of 100.8 at home but no fever here.  Surgery has seen her but there is no need for surgery currently.   Colonoscopy in 2016 with [name] showed the following:  1. Two polyps were found in the transverse colon and ascending colon; polypectomy was performed with a cold snare 2. Mild diverticulosis was noted in the sigmoid colon 3. The examination was otherwise normal  Pathology showed a fragment of polypoid benign colonic mucosa without adenomatous changes.  Past Medical History:  Diagnosis Date   Anemia    Anxiety    Brain tumor (benign) ([place])    Colitis 2010   microscopic- Dr Henrene Pastor   Depression    Diabetes mellitus    type II   Dyspnea    with exertion   ESRD (end stage renal disease) ([place])    TTUSAT [address]    Fibromyalgia    GERD (gastroesophageal reflux disease)    Headache    History of blood transfusion    after knee surgery   Hypertension    discontinued all diuretics and antihypertensives   IBS (irritable bowel syndrome)    LBP (low back pain)    Neuropathy    feet bilat    Osteoarthritis    Osteopenia    Pneumonia  hx of 2014    Rotator cuff tear, right    Sinusitis    currently being treated with antibiotic will complete 03/04/2015    Past Surgical History:  Procedure Laterality Date   AV FISTULA PLACEMENT Right 09/12/2020   Procedure: RIGHT ARTERIOVENOUS (AV) FISTULA CREATION;  Surgeon: Elam Dutch, MD;  Location: [facility];  Service: Vascular;  Laterality: Right;   [place] Right 10/27/2020   Procedure: RIGHT UPPER EXTREMITY SECOND STAGE [place];  Surgeon: Elam Dutch, MD;   Location: [facility] OR;  Service: Vascular;  Laterality: Right;   foramen magnum ependymoma surgery  2003   Dr Rita Ohara   JOINT REPLACEMENT Bilateral    NASAL SINUS SURGERY     1973    TONSILLECTOMY     TOTAL KNEE ARTHROPLASTY     L 2008, R 2009, R 2016- Dr Maureen Ralphs   TOTAL KNEE REVISION Right 03/05/2015   Procedure: RIGHT TOTAL KNEE ARTHROPLASTY REVISION;  Surgeon: Gaynelle Arabian, MD;  Location: [facility];  Service: Orthopedics;  Laterality: Right;    Prior to Admission medications   Medication Sig Start Date End Date Taking? Authorizing Provider  acetaminophen (TYLENOL) 500 MG tablet Take 1,000 mg by mouth every 6 (six) hours as needed for moderate pain or headache.   Yes [provider]  amoxicillin (AMOXIL) 500 MG capsule TAKE 4 CAPSULES BY MOUTH 1  HOUR PRIOR TO DENTAL  PROCEDURE/CLEANING Patient taking differently: Take 2,000 mg by mouth See admin instructions. Take 2000 mg 1 hour prior to dental work 04/16/20  Yes Plotnikov, Evie Lacks, MD  B Complex-C-Folic Acid (RENA-VITE PO) Take 1 tablet a day   Yes [provider]  blood glucose meter kit and supplies Dispense based on patient and insurance preference. Used to check blood sugar daily, DX: E11.9 OneTouch 12/20/17  Yes Plotnikov, Evie Lacks, MD  buPROPion (WELLBUTRIN XL) 300 MG 24 hr tablet Take 1 tablet (300 mg total) by mouth at bedtime. 10/29/20  Yes Plotnikov, Evie Lacks, MD  cyclobenzaprine (FLEXERIL) 10 MG tablet Take 1 tablet (10 mg total) by mouth 3 (three) times daily as needed for muscle spasms. 04/09/21  Yes Plotnikov, Evie Lacks, MD  dicyclomine (BENTYL) 20 MG tablet Take one tablet every 4-6 hours as needed for abdominal pain Patient taking differently: Take 20 mg by mouth every 4 (four) hours as needed (abdominal pain). 01/30/20  Yes Irene Shipper, MD  DULoxetine (CYMBALTA) 60 MG capsule Take 1 capsule (60 mg total) by mouth at bedtime. 12/15/20  Yes Plotnikov, Evie Lacks, MD  fexofenadine (ALLEGRA) 180 MG tablet Take 1  tablet (180 mg total) by mouth daily. Patient taking differently: Take 180 mg by mouth daily as needed for allergies. 08/14/20 08/14/21 Yes Biagio Borg, MD  glimepiride (AMARYL) 1 MG tablet Take 1 tablet (1 mg total) by mouth daily before breakfast. 10/29/20  Yes Plotnikov, Evie Lacks, MD  glucose blood (TRUETEST TEST) test strip Used to check blood sugar daily, DX: E11.9 09/05/20  Yes Plotnikov, Evie Lacks, MD  ketoconazole (NIZORAL) 2 % cream APPLY TO THE AFFECTED AREA(S) BY TOPICAL ROUTE ONCE DAILY 04/20/21  Yes   Lancets (ONETOUCH ULTRASOFT) lancets Used to check blood sugar daily, DX: E11.9 12/20/17  Yes Plotnikov, Evie Lacks, MD  lidocaine (XYLOCAINE) 5 % ointment APPLY TO AFFECTED AREA(S)  TOPICALLY 3 TIMES DAILY AS  NEEDED 11/25/20  Yes Plotnikov, Evie Lacks, MD  lidocaine-prilocaine (EMLA) cream SMARTSIG:Sparingly Topical As Directed 12/10/20  Yes [provider]  LORazepam (ATIVAN) 2 MG tablet TAKE 1 TABLET BY MOUTH ONCE EVERY NIGHT AT BEDTIME AS NEEDED INSOMNIA 02/09/21 09/07/21 Yes Plotnikov, Evie Lacks, MD  oxyCODONE (ROXICODONE) 15 MG immediate release tablet Take 1 tablet (15 mg total) by mouth 4 (four) times daily as needed. Patient taking differently: Take 15 mg by mouth 4 (four) times daily as needed for pain. 08/05/21  Yes   pantoprazole (PROTONIX) 40 MG tablet Take 1 tablet (40 mg total) by mouth daily. 12/15/20  Yes Plotnikov, Evie Lacks, MD  pilocarpine (SALAGEN) 5 MG tablet Take 1 tablet (5 mg total) by mouth 2 (two) times daily. 10/29/20  Yes Plotnikov, Evie Lacks, MD  pioglitazone (ACTOS) 15 MG tablet Take 1 tablet (15 mg total) by mouth daily. 10/29/20  Yes Plotnikov, Evie Lacks, MD  polyethylene glycol (MIRALAX / GLYCOLAX) 17 g packet Take 17 g by mouth daily as needed (constipation.).   Yes [provider]  polyvinyl alcohol (LIQUIFILM TEARS) 1.4 % ophthalmic solution Place 1 drop into both eyes daily as needed for dry eyes.   Yes [provider]  sevelamer  carbonate (RENVELA) 800 MG tablet Take 1,600 mg by mouth 3 (three) times daily with meals. Take 2 tablets tid and 1 tablet if eating a snack 11/07/20  Yes [provider]  amoxicillin (AMOXIL) 500 MG capsule Take 1 capsule (500 mg total) by mouth 3 (three) times daily until gone Patient not taking: Reported on 08/05/2021 06/19/21     LORazepam (ATIVAN) 2 MG tablet TAKE 1 TABLET BY MOUTH ONCE EVERY NIGHT AT BEDTIME AS NEEDED INSOMNIA Patient not taking: Reported on 08/05/2021 02/09/21   Plotnikov, Evie Lacks, MD    Current Facility-Administered Medications  Medication Dose Route Frequency Provider Last Rate Last Admin   acetaminophen (TYLENOL) tablet 650 mg  650 mg Oral Q6H PRN Marcelyn Bruins, MD   650 mg at 08/06/21 1138   Or   acetaminophen (TYLENOL) suppository 650 mg  650 mg Rectal Q6H PRN Marcelyn Bruins, MD       buPROPion (WELLBUTRIN XL) 24 hr tablet 300 mg  300 mg Oral QHS Elodia Florence., MD       DULoxetine (CYMBALTA) DR capsule 60 mg  60 mg Oral QHS Elodia Florence., MD       heparin injection 5,000 Units  5,000 Units Subcutaneous Q8H Elodia Florence., MD       HYDROmorphone (DILAUDID) injection 0.5 mg  0.5 mg Intravenous Q4H PRN Marcelyn Bruins, MD   0.5 mg at 08/06/21 1214   insulin aspart (novoLOG) injection 0-6 Units  0-6 Units Subcutaneous TID WC Marcelyn Bruins, MD       lidocaine (XYLOCAINE) 5 % ointment   Topical TID PRN Marcelyn Bruins, MD       LORazepam (ATIVAN) tablet 2 mg  2 mg Oral QHS PRN Elodia Florence., MD       pantoprazole (PROTONIX) EC tablet 40 mg  40 mg Oral Daily Marcelyn Bruins, MD   40 mg at 08/06/21 1020   pilocarpine (SALAGEN) tablet 5 mg  5 mg Oral BID Elodia Florence., MD   5 mg at 08/06/21 1019   piperacillin-tazobactam (ZOSYN) IVPB 2.25 g  2.25 g Intravenous Q8H Marcelyn Bruins, MD   Stopped at 08/06/21 0855   polyethylene glycol (MIRALAX / GLYCOLAX) packet 17 g  17 g Oral Daily PRN Elodia Florence., MD   [phone]  g at 08/06/21 1138   polyvinyl alcohol (LIQUIFILM TEARS) 1.4 % ophthalmic solution 1 drop  1 drop Both Eyes Daily PRN Marcelyn Bruins, MD       sevelamer carbonate (RENVELA) tablet 1,600 mg  1,600 mg Oral TID WC Elodia Florence., MD       sodium chloride flush (NS) 0.9 % injection 3 mL  3 mL Intravenous Q12H Marcelyn Bruins, MD   3 mL at 08/06/21 1020    Allergies as of 08/05/2021 - Review Complete 08/05/2021  Allergen Reaction Noted   Erythromycin Diarrhea and Nausea And Vomiting    Gabapentin Other (See Comments) 09/05/2020    Family History  Problem Relation Age of Onset   Depression Mother    Hypertension Mother    Stroke Mother 98   Diabetes Mother    Diabetes Father    Hyperlipidemia Father    Hypertension Father    Crohn's disease Maternal Uncle    Diabetes Other    Cancer Brother        Prostate   Diabetes Brother    Heart disease Brother    Hyperlipidemia Brother    Hypertension Brother    Diabetes Brother    Heart disease Brother        Heart Disease before age 45   Heart attack Brother    Stroke Brother        X's 2   Hyperlipidemia Brother    Hypertension Brother    Colon cancer Neg Hx     Social History   Socioeconomic History   Marital status: Single    Spouse name: Not on file   Number of children: Not on file   Years of education: Not on file   Highest education level: Not on file  Occupational History   Not on file  Tobacco Use   Smoking status: Former    Packs/day: 1.00    Years: 20.00    Pack years: 20.00    Types: Cigarettes    Quit date: 12/13/1988    Years since quitting: 32.6   Smokeless tobacco: Never  Vaping Use   Vaping Use: Never used  Substance and Sexual Activity   Alcohol use: Not Currently    Alcohol/week: 0.0 standard drinks    Comment: rarely   Drug use: No   Sexual activity: Not Currently  Other Topics Concern   Not on file  Social History Narrative   Not on file   Social  Determinants of Health   Financial Resource Strain: Low Risk    Difficulty of Paying Living Expenses: Not hard at all  Food Insecurity: No Food Insecurity   Worried About [name] fundraiser in the Last Year: Never true   Ran Out of Food in the Last Year: Never true  Transportation Needs: No Transportation Needs   Lack of Transportation (Medical): No   Lack of Transportation (Non-Medical): No  Physical Activity: Inactive   Days of Exercise per Week: 0 days   Minutes of Exercise per Session: 0 min  Stress: No Stress Concern Present   Feeling of Stress : Not at all  Social Connections: Unknown   Frequency of Communication with Friends and Family: More than three times a week   Frequency of Social Gatherings with Friends and Family: More than three times a week   Attends Religious Services: Patient refused   Active Member of Clubs or Organizations: Patient refused   Attends Archivist Meetings: Patient refused  Marital Status: Never married  Human resources officer Violence: Not At Risk   Fear of Current or Ex-Partner: No   Emotionally Abused: No   Physically Abused: No   Sexually Abused: No    Review of Systems: ROS is O/W negative except as mentioned in HPI.  Physical Exam: Vital signs in last 24 hours: Temp:  [97.6 F (36.4 C)-98.5 F (36.9 C)] 98 F (36.7 C) (08/25 1113) Pulse Rate:  [70-95] 85 (08/25 1113) Resp:  [15-18] 16 (08/25 1113) BP: (84-137)/(36-74) 137/57 (08/25 1113) SpO2:  [86 %-100 %] 96 % (08/25 1113) Weight:  [98.4 kg-98.5 kg] 98.4 kg (08/24 2020)   General:  Alert, Well-developed, well-nourished, pleasant and cooperative in NAD Head:  Normocephalic and atraumatic. Eyes:  Sclera clear, no icterus.  Conjunctiva pink. Ears:  Normal auditory acuity. Mouth:  No deformity or lesions.   Lungs:  Clear throughout to auscultation.  No wheezes, crackles, or rhonchi.  Heart:  Regular rate and rhythm; no murmurs, clicks, rubs,  or gallops. Abdomen:  Soft,  non-distended.  BS present.  Moderate diffuse TTP. Msk:  Symmetrical without gross deformities. Pulses:  Normal pulses noted. Extremities:  Without clubbing or edema. Neurologic:  Alert and oriented x 4;  grossly normal neurologically. Skin:  Intact without significant lesions or rashes. Psych:  Alert and cooperative. Normal mood and affect.  Intake/Output from previous day: 08/24 0701 - 08/25 0700 In: 500 [IV Piggyback:500] Out: -  Intake/Output this shift: Total I/O In: 1030 [I.V.:1030] Out: -   Lab Results: Recent Labs    08/05/21 1557 08/05/21 1816 08/06/21 0139  WBC 26.8 Repeated and verified X2.* 25.2* 22.5*  HGB 11.0* 11.7* 10.7*  HCT 33.6* 36.4 34.4*  PLT 198.0 181 148*   BMET Recent Labs    08/05/21 1557 08/05/21 1816 08/06/21 0139  NA 130* 131* 130*  K 5.0 4.3 5.0  CL 87* 89* 91*  CO2 _0 GLUCOSE 145* 142* 100*  BUN 42* 42* 46*  CREATININE 4.49* 4.76* 4.92*  CALCIUM 10.3 9.9 9.3   LFT Recent Labs    08/05/21 1557 08/05/21 1816 08/06/21 0139  PROT 7.4   < > 6.4*  ALBUMIN 4.0   < > 3.1*  AST 21   < > 22  ALT 22   < > 20  ALKPHOS 101   < > 82  BILITOT 0.5   < > 0.8  BILIDIR 0.1  --   --    < > = values in this interval not displayed.   PT/INR Recent Labs    08/06/21 0139  LABPROT 13.8  INR 1.1   Studies/Results: CT ABDOMEN PELVIS WO CONTRAST  Result Date: 08/05/2021 CLINICAL DATA:  Abdominal pain and fever. Elevated white blood cell count. Dialysis patient. EXAM: CT ABDOMEN AND PELVIS WITHOUT CONTRAST TECHNIQUE: Multidetector CT imaging of the abdomen and pelvis was performed following the standard protocol without IV contrast. COMPARISON:  Radiograph earlier today FINDINGS: Lower chest: No acute airspace disease or pleural effusion. Heart is normal in size. Hepatobiliary: There is no focal hepatic abnormality. Mild gallbladder distention without calcified gallstone. No pericholecystic fat stranding. No biliary dilatation. Pancreas:  Mild atrophy.  No ductal dilatation or inflammation. Spleen: Normal in size without focal abnormality. Adrenals/Urinary Tract: Minimal left adrenal thickening. No adrenal nodule. Bilateral renal parenchymal atrophy consistent with chronic renal disease. 3.3 cm cyst in the periphery of the left kidney, with additional smaller renal cortical lesions are too small to characterize. No evidence of suspicious  solid lesion. Urinary bladder is near completely empty. Stomach/Bowel: Detailed bowel assessment is limited in the absence of enteric contrast. The stomach is nondistended. Short segment of small bowel in the right mid abdomen demonstrates mild distension, fecalized contents, wall thickening and perienteric fat stranding, for example series 3, image 56. this does not appear to represent a blind-ending tubular structure Meckel's diverticulum, rather a loop of your tight it small bowel. Normal appendix, series 6, image 36. Moderate volume of stool throughout the colon. There is transverse and sigmoid colonic redundancy. Mild sigmoid diverticulosis without focal diverticulitis. No colonic inflammation. Vascular/Lymphatic: Aortic atherosclerosis. No aortic aneurysm. No portal venous or mesenteric gas. Few prominent mesenteric lymph nodes, in the region of small bowel inflammation. There is no bulky abdominopelvic adenopathy. Reproductive: Small calcified uterine fibroids. The uterus is atrophic. No adnexal mass. Other: Moderate-sized fat containing umbilical hernia. No bowel involvement or inflammation. There is no ascites or free air. Musculoskeletal: Degenerative change in the spine. No focal bone abnormality or acute osseous findings. IMPRESSION: 1. Short segment of small bowel in the right mid abdomen demonstrates mild distension, fecalized contents, wall thickening, and perienteric fat stranding. Findings are suspicious for enteritis, may be infectious or inflammatory. No bowel perforation. 2. Mild sigmoid  diverticulosis without focal diverticulitis. 3. Moderate-sized fat containing umbilical hernia. No bowel involvement or inflammation. 4. Sequela of chronic renal disease with renal parenchymal atrophy. Aortic Atherosclerosis (ICD10-I70.0). Electronically Signed   By: Keith Rake M.D.   On: 08/05/2021 18:51   DG Chest Port 1 View  Result Date: 08/05/2021 CLINICAL DATA:  Shortness of breath and fevers, initial encounter EXAM: PORTABLE CHEST 1 VIEW COMPARISON:  03/01/2016 FINDINGS: Cardiac shadow is within normal limits. Lungs are well aerated bilaterally. Patchy left basilar airspace opacity is noted consistent with atelectasis as it was not seen on recent CT. No bony abnormality is noted. IMPRESSION: Mild left basilar atelectasis new from prior CT examination. Electronically Signed   By: Inez Catalina M.D.   On: 08/05/2021 20:21   DG ABD ACUTE 2+V W 1V CHEST  Result Date: 08/05/2021 CLINICAL DATA:  Abdominal pain, fever, previous tobacco abuse EXAM: DG ABDOMEN ACUTE WITH 1 VIEW CHEST COMPARISON:  02/22/2018 FINDINGS: Supine and upright frontal views of the abdomen and pelvis are obtained, excluding the flanks by collimation. Upright frontal view of the chest was also performed. Cardiac silhouette is unremarkable. No airspace disease, effusion, or pneumothorax. Bowel gas pattern is unremarkable without obstruction or ileus. There is moderate stool throughout the colon. No masses or abnormal calcifications. No free gas in the greater peritoneal sac. Left convex scoliosis centered in the upper lumbar spine. Diffuse multilevel thoracolumbar spondylosis. IMPRESSION: 1. Moderate retained stool throughout the colon. 2. No bowel obstruction or ileus. 3. No acute intrathoracic process. Electronically Signed   By: Randa Ngo M.D.   On: 08/05/2021 15:30    IMPRESSION:  *70 year old female who presents with complaints of ongoing abdominal pain and CT scan showing short segment of small bowel in the right mid  abdomen demonstrates mild distension, fecalized contents, wall thickening, and perienteric fat stranding.  White blood cell count elevated at 26.8 and a elevated lactic acid that resolved with hydration.  Pain reportedly started during dialysis.  At that point she was also noted to have low blood pressure.  Had a fever of 100.8.  Surgery has evaluated the patient as well.  Suspicious for hypoperfusion/transient ischemia in the setting of dialysis.  ? Infectious source but no diarrhea. *End-stage renal  disease on HD TTS  PLAN: -Continue Zosyn empirically for now. -Trend labs. -Will add dicyclomine to see if that helps.  Started with 10 mg every 8 hours standing. -If she does not improve in the next couple of days and of course if she worsens then should consider repeat CT scan. -Clear liquids for now.   Laban Emperor. Catlin Doria  08/06/2021, 12:37 PM

## 2021-08-06 NOTE — Telephone Encounter (Signed)
She is in the ER Thx

## 2021-08-07 ENCOUNTER — Other Ambulatory Visit (HOSPITAL_COMMUNITY): Payer: Self-pay

## 2021-08-07 DIAGNOSIS — K529 Noninfective gastroenteritis and colitis, unspecified: Secondary | ICD-10-CM | POA: Diagnosis not present

## 2021-08-07 LAB — CBC WITH DIFFERENTIAL/PLATELET
Abs Immature Granulocytes: 0.03 10*3/uL (ref 0.00–0.07)
Basophils Absolute: 0 10*3/uL (ref 0.0–0.1)
Basophils Relative: 0 %
Eosinophils Absolute: 0.2 10*3/uL (ref 0.0–0.5)
Eosinophils Relative: 2 %
HCT: 31.2 % — ABNORMAL LOW (ref 36.0–46.0)
Hemoglobin: 10.1 g/dL — ABNORMAL LOW (ref 12.0–15.0)
Immature Granulocytes: 0 %
Lymphocytes Relative: 17 %
Lymphs Abs: 1.9 10*3/uL (ref 0.7–4.0)
MCH: 33.2 pg (ref 26.0–34.0)
MCHC: 32.4 g/dL (ref 30.0–36.0)
MCV: 102.6 fL — ABNORMAL HIGH (ref 80.0–100.0)
Monocytes Absolute: 1.2 10*3/uL — ABNORMAL HIGH (ref 0.1–1.0)
Monocytes Relative: 11 %
Neutro Abs: 8 10*3/uL — ABNORMAL HIGH (ref 1.7–7.7)
Neutrophils Relative %: 70 %
Platelets: 167 10*3/uL (ref 150–400)
RBC: 3.04 MIL/uL — ABNORMAL LOW (ref 3.87–5.11)
RDW: 13.2 % (ref 11.5–15.5)
WBC: 11.3 10*3/uL — ABNORMAL HIGH (ref 4.0–10.5)
nRBC: 0 % (ref 0.0–0.2)

## 2021-08-07 LAB — GLUCOSE, CAPILLARY
Glucose-Capillary: 101 mg/dL — ABNORMAL HIGH (ref 70–99)
Glucose-Capillary: 114 mg/dL — ABNORMAL HIGH (ref 70–99)
Glucose-Capillary: 140 mg/dL — ABNORMAL HIGH (ref 70–99)
Glucose-Capillary: 87 mg/dL (ref 70–99)

## 2021-08-07 LAB — COMPREHENSIVE METABOLIC PANEL
ALT: 18 U/L (ref 0–44)
AST: 16 U/L (ref 15–41)
Albumin: 2.8 g/dL — ABNORMAL LOW (ref 3.5–5.0)
Alkaline Phosphatase: 77 U/L (ref 38–126)
Anion gap: 13 (ref 5–15)
BUN: 55 mg/dL — ABNORMAL HIGH (ref 8–23)
CO2: 27 mmol/L (ref 22–32)
Calcium: 9.5 mg/dL (ref 8.9–10.3)
Chloride: 91 mmol/L — ABNORMAL LOW (ref 98–111)
Creatinine, Ser: 5.73 mg/dL — ABNORMAL HIGH (ref 0.44–1.00)
GFR, Estimated: 8 mL/min — ABNORMAL LOW (ref >60)
Glucose, Bld: 112 mg/dL — ABNORMAL HIGH (ref 70–99)
Potassium: 5.5 mmol/L — ABNORMAL HIGH (ref 3.5–5.1)
Sodium: 131 mmol/L — ABNORMAL LOW (ref 135–145)
Total Bilirubin: 1.2 mg/dL (ref 0.3–1.2)
Total Protein: 6.2 g/dL — ABNORMAL LOW (ref 6.5–8.1)

## 2021-08-07 LAB — PHOSPHORUS: Phosphorus: 4.9 mg/dL — ABNORMAL HIGH (ref 2.5–4.6)

## 2021-08-07 LAB — MAGNESIUM: Magnesium: 2.6 mg/dL — ABNORMAL HIGH (ref 1.7–2.4)

## 2021-08-07 MED ORDER — GLYCERIN (LAXATIVE) 2.1 G RE SUPP
1.0000 | Freq: Once | RECTAL | Status: AC | PRN
  Administered 2021-08-07: 1 via RECTAL
  Filled 2021-08-07: qty 1

## 2021-08-07 MED ORDER — DARBEPOETIN ALFA 60 MCG/0.3ML IJ SOSY
60.0000 ug | PREFILLED_SYRINGE | INTRAMUSCULAR | Status: DC
  Filled 2021-08-07: qty 0.3

## 2021-08-07 NOTE — Progress Notes (Signed)
Hemostasis achieved, gauze dressing applied

## 2021-08-07 NOTE — Progress Notes (Signed)
[facility] Progress Note   Subjective:   Patient seen and examined at bedside during dialysis.  Tolerating treatment well so far.  Admits to ongoing abdominal pain but mostly just in RLQ for now.  Denies CP, SOB, n/v/d, and fatigue.   Objective Vitals:   08/07/21 1000 08/07/21 1030 08/07/21 1140 08/07/21 1200  BP: (!) 107/53 (!) 106/42 (!) 112/31 (!) 100/44  Pulse: 78 85 88 85  Resp: 19 16 19 11   Temp:   99 F (37.2 C)   TempSrc:   Oral   SpO2: 98% 100% 98% 96%  Weight:      Height:       Physical Exam General:WDWN, alert, pleasant female in NAD Heart:RRR, no mrg Lungs:CTAB, nml WOB on RA Abdomen:soft, mildly distended, +tenderness, worsened at RLQ Extremities:no LE edema Dialysis Access: RU AVF in use   [facility] Weights   08/05/21 2020 08/07/21 0743  Weight: 98.4 kg 99.2 kg    Intake/Output Summary (Last 24 hours) at 08/07/2021 1227 Last data filed at 08/06/2021 1848 Gross per 24 hour  Intake 480 ml  Output --  Net 480 ml    Additional Objective Labs: Basic Metabolic Panel: Recent Labs  Lab 08/05/21 1816 08/06/21 0139 08/07/21 0341  NA 131* 130* 131*  K 4.3 5.0 5.5*  CL 89* 91* 91*  CO2 25 26 27   GLUCOSE 142* 100* 112*  BUN 42* 46* 55*  CREATININE 4.76* 4.92* 5.73*  CALCIUM 9.9 9.3 9.5  PHOS  --   --  4.9*   Liver Function Tests: Recent Labs  Lab 08/05/21 1816 08/06/21 0139 08/07/21 0341  AST 27 22 16   ALT 25 20 18   ALKPHOS 93 82 77  BILITOT 1.0 0.8 1.2  PROT 7.3 6.4* 6.2*  ALBUMIN 3.6 3.1* 2.8*   Recent Labs  Lab 08/05/21 1557 08/05/21 1816  LIPASE 5.0* 20  AMYLASE 20*  --    CBC: Recent Labs  Lab 08/05/21 1557 08/05/21 1816 08/06/21 0139 08/07/21 0341  WBC 26.8 Repeated and verified X2.* 25.2* 22.5* 11.3*  NEUTROABS 22.6* 21.3*  --  8.0*  HGB 11.0* 11.7* 10.7* 10.1*  HCT 33.6* 36.4 34.4* 31.2*  MCV 99.8 104.0* 108.2* 102.6*  PLT 198.0 181 148* 167   CBG: Recent Labs  Lab 08/06/21 0755 08/06/21 1125  08/06/21 1532 08/07/21 0724 08/07/21 1141  GLUCAP 63* 119* 76 114* 87   Studies/Results: CT ABDOMEN PELVIS WO CONTRAST  Result Date: 08/05/2021 CLINICAL DATA:  Abdominal pain and fever. Elevated white blood cell count. Dialysis patient. EXAM: CT ABDOMEN AND PELVIS WITHOUT CONTRAST TECHNIQUE: Multidetector CT imaging of the abdomen and pelvis was performed following the standard protocol without IV contrast. COMPARISON:  Radiograph earlier today FINDINGS: Lower chest: No acute airspace disease or pleural effusion. Heart is normal in size. Hepatobiliary: There is no focal hepatic abnormality. Mild gallbladder distention without calcified gallstone. No pericholecystic fat stranding. No biliary dilatation. Pancreas: Mild atrophy.  No ductal dilatation or inflammation. Spleen: Normal in size without focal abnormality. Adrenals/Urinary Tract: Minimal left adrenal thickening. No adrenal nodule. Bilateral renal parenchymal atrophy consistent with chronic renal disease. 3.3 cm cyst in the periphery of the left kidney, with additional smaller renal cortical lesions are too small to characterize. No evidence of suspicious solid lesion. Urinary bladder is near completely empty. Stomach/Bowel: Detailed bowel assessment is limited in the absence of enteric contrast. The stomach is nondistended. Short segment of small bowel in the right mid abdomen demonstrates mild distension, fecalized contents, wall  thickening and perienteric fat stranding, for example series 3, image 56. this does not appear to represent a blind-ending tubular structure Meckel's diverticulum, rather a loop of your tight it small bowel. Normal appendix, series 6, image 36. Moderate volume of stool throughout the colon. There is transverse and sigmoid colonic redundancy. Mild sigmoid diverticulosis without focal diverticulitis. No colonic inflammation. Vascular/Lymphatic: Aortic atherosclerosis. No aortic aneurysm. No portal venous or mesenteric gas.  Few prominent mesenteric lymph nodes, in the region of small bowel inflammation. There is no bulky abdominopelvic adenopathy. Reproductive: Small calcified uterine fibroids. The uterus is atrophic. No adnexal mass. Other: Moderate-sized fat containing umbilical hernia. No bowel involvement or inflammation. There is no ascites or free air. Musculoskeletal: Degenerative change in the spine. No focal bone abnormality or acute osseous findings. IMPRESSION: 1. Short segment of small bowel in the right mid abdomen demonstrates mild distension, fecalized contents, wall thickening, and perienteric fat stranding. Findings are suspicious for enteritis, may be infectious or inflammatory. No bowel perforation. 2. Mild sigmoid diverticulosis without focal diverticulitis. 3. Moderate-sized fat containing umbilical hernia. No bowel involvement or inflammation. 4. Sequela of chronic renal disease with renal parenchymal atrophy. Aortic Atherosclerosis (ICD10-I70.0). Electronically Signed   By: Keith Rake M.D.   On: 08/05/2021 18:51   DG Chest Port 1 View  Result Date: 08/05/2021 CLINICAL DATA:  Shortness of breath and fevers, initial encounter EXAM: PORTABLE CHEST 1 VIEW COMPARISON:  03/01/2016 FINDINGS: Cardiac shadow is within normal limits. Lungs are well aerated bilaterally. Patchy left basilar airspace opacity is noted consistent with atelectasis as it was not seen on recent CT. No bony abnormality is noted. IMPRESSION: Mild left basilar atelectasis new from prior CT examination. Electronically Signed   By: Inez Catalina M.D.   On: 08/05/2021 20:21   DG ABD ACUTE 2+V W 1V CHEST  Result Date: 08/05/2021 CLINICAL DATA:  Abdominal pain, fever, previous tobacco abuse EXAM: DG ABDOMEN ACUTE WITH 1 VIEW CHEST COMPARISON:  02/22/2018 FINDINGS: Supine and upright frontal views of the abdomen and pelvis are obtained, excluding the flanks by collimation. Upright frontal view of the chest was also performed. Cardiac  silhouette is unremarkable. No airspace disease, effusion, or pneumothorax. Bowel gas pattern is unremarkable without obstruction or ileus. There is moderate stool throughout the colon. No masses or abnormal calcifications. No free gas in the greater peritoneal sac. Left convex scoliosis centered in the upper lumbar spine. Diffuse multilevel thoracolumbar spondylosis. IMPRESSION: 1. Moderate retained stool throughout the colon. 2. No bowel obstruction or ileus. 3. No acute intrathoracic process. Electronically Signed   By: Randa Ngo M.D.   On: 08/05/2021 15:30    Medications:  piperacillin-tazobactam (ZOSYN)  IV 2.25 g (08/07/21 1210)    buPROPion  300 mg Oral QHS   calcitRIOL  1.5 mcg Oral Q T,Th,Sa-HD   darbepoetin (ARANESP) injection - DIALYSIS  60 mcg Intravenous Q Fri-HD   dicyclomine  10 mg Oral Q8H   DULoxetine  60 mg Oral QHS   heparin injection (subcutaneous)  5,000 Units Subcutaneous Q8H   insulin aspart  0-6 Units Subcutaneous TID WC   pantoprazole  40 mg Oral Daily   pilocarpine  5 mg Oral BID   sevelamer carbonate  1,600 mg Oral TID WC   sodium chloride flush  3 mL Intravenous Q12H    Dialysis Orders: [facility], TTS 4-hour, 97 edw  2K, 2 CA bath, UF profile 2  Heparin 5000  Calcitriol 1.5 mic  Mircera 60 every 2 weeks starting 8/25  Right UA aVF      Assessment/Plan   Abdominal pain/enteritis infectious versus inflammatory enteritis, ischemia - GI and surgery following. WBC improved from 22K to 11K.   ESRD -HD TTS, HD off schedule today due to staffing issues/increased patient census.  Will plan for HD again Mon or Tues pending labs/volume status. Hypertension/volume  -BP stable, not on antihypertensives.  Does not appear volume overloaded.  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 Aranesp held d/t Hgb>10.  Continue to follow.  Metabolic bone disease -Corrected Ca elevated, use low Ca bath, hold VDRA.  phos in goal, continue renvela when eating. Anxiety/depression - on meds per  admit Type 2 diabetes mellitus- Rx per admit Nutrition - Alb 2.6.  Protein supplements when diet advanced.   Jen Mow, PA-C [facility] 08/07/2021,12:27 PM  LOS: 1 day

## 2021-08-07 NOTE — Plan of Care (Signed)
  Problem: Nutrition: Goal: Adequate nutrition will be maintained Outcome: Progressing   Problem: Elimination: Goal: Will not experience complications related to bowel motility Outcome: Progressing   Problem: Safety: Goal: Ability to remain free from injury will improve Outcome: Progressing   Problem: Skin Integrity: Goal: Risk for impaired skin integrity will decrease Outcome: Progressing   

## 2021-08-07 NOTE — Plan of Care (Signed)

## 2021-08-07 NOTE — Progress Notes (Signed)
Subjective: CC: Still with some generalized abdominal pain, but more focal today in RLQ.  No nausea.  Tolerating some CLD, but doesn't like them much as she is a diabetic.  In HD currently.  Had a small BM with a suppository last night.  Doesn't feel like her pain is really that much better.  Objective: Vital signs in last 24 hours: Temp:  [98 F (36.7 C)-99.2 F (37.3 C)] 99.2 F (37.3 C) (08/26 0757) Pulse Rate:  [45-114] 84 (08/26 0830) Resp:  [14-21] 20 (08/26 0930) BP: (104-137)/(34-77) 105/54 (08/26 0930) SpO2:  [88 %-98 %] 98 % (08/26 0930) Weight:  [99.2 kg] 99.2 kg (08/26 0743) Last BM Date: (P) 08/07/21  Intake/Output from previous day: 08/25 0701 - 08/26 0700 In: 1594 [P.O.:480; I.V.:1030; IV Piggyback:84] Out: -  Intake/Output this shift: No intake/output data recorded.  PE: Gen:  Alert, NAD, pleasant Card:  RRR Pulm:  Normal rate and effort  Abd: Soft, mild distension, mild diffuse ttp without rebound, rigidity or guarding, but slightly more focal in RLQ. +BS. Umbilical hernia reducible.   Psych: A&Ox3    Lab Results:  Recent Labs    08/06/21 0139 08/07/21 0341  WBC 22.5* 11.3*  HGB 10.7* 10.1*  HCT 34.4* 31.2*  PLT 148* 167   BMET Recent Labs    08/06/21 0139 08/07/21 0341  NA 130* 131*  K 5.0 5.5*  CL 91* 91*  CO2 26 27  GLUCOSE 100* 112*  BUN 46* 55*  CREATININE 4.92* 5.73*  CALCIUM 9.3 9.5   PT/INR Recent Labs    08/06/21 0139  LABPROT 13.8  INR 1.1   CMP     Component Value Date/Time   NA 131 (L) 08/07/2021 0341   K 5.5 (H) 08/07/2021 0341   CL 91 (L) 08/07/2021 0341   CO2 27 08/07/2021 0341   GLUCOSE 112 (H) 08/07/2021 0341   BUN 55 (H) 08/07/2021 0341   CREATININE 5.73 (H) 08/07/2021 0341   CALCIUM 9.5 08/07/2021 0341   PROT 6.2 (L) 08/07/2021 0341   ALBUMIN 2.8 (L) 08/07/2021 0341   AST 16 08/07/2021 0341   ALT 18 08/07/2021 0341   ALKPHOS 77 08/07/2021 0341   BILITOT 1.2 08/07/2021 0341   GFRNONAA 8 (L)  08/07/2021 0341   GFRAA 66 (L) 03/07/2015 0449   Lipase     Component Value Date/Time   LIPASE 20 08/05/2021 1816    Studies/Results: CT ABDOMEN PELVIS WO CONTRAST  Result Date: 08/05/2021 CLINICAL DATA:  Abdominal pain and fever. Elevated white blood cell count. Dialysis patient. EXAM: CT ABDOMEN AND PELVIS WITHOUT CONTRAST TECHNIQUE: Multidetector CT imaging of the abdomen and pelvis was performed following the standard protocol without IV contrast. COMPARISON:  Radiograph earlier today FINDINGS: Lower chest: No acute airspace disease or pleural effusion. Heart is normal in size. Hepatobiliary: There is no focal hepatic abnormality. Mild gallbladder distention without calcified gallstone. No pericholecystic fat stranding. No biliary dilatation. Pancreas: Mild atrophy.  No ductal dilatation or inflammation. Spleen: Normal in size without focal abnormality. Adrenals/Urinary Tract: Minimal left adrenal thickening. No adrenal nodule. Bilateral renal parenchymal atrophy consistent with chronic renal disease. 3.3 cm cyst in the periphery of the left kidney, with additional smaller renal cortical lesions are too small to characterize. No evidence of suspicious solid lesion. Urinary bladder is near completely empty. Stomach/Bowel: Detailed bowel assessment is limited in the absence of enteric contrast. The stomach is nondistended. Short segment of small bowel in the right  mid abdomen demonstrates mild distension, fecalized contents, wall thickening and perienteric fat stranding, for example series 3, image 56. this does not appear to represent a blind-ending tubular structure Meckel's diverticulum, rather a loop of your tight it small bowel. Normal appendix, series 6, image 36. Moderate volume of stool throughout the colon. There is transverse and sigmoid colonic redundancy. Mild sigmoid diverticulosis without focal diverticulitis. No colonic inflammation. Vascular/Lymphatic: Aortic atherosclerosis. No aortic  aneurysm. No portal venous or mesenteric gas. Few prominent mesenteric lymph nodes, in the region of small bowel inflammation. There is no bulky abdominopelvic adenopathy. Reproductive: Small calcified uterine fibroids. The uterus is atrophic. No adnexal mass. Other: Moderate-sized fat containing umbilical hernia. No bowel involvement or inflammation. There is no ascites or free air. Musculoskeletal: Degenerative change in the spine. No focal bone abnormality or acute osseous findings. IMPRESSION: 1. Short segment of small bowel in the right mid abdomen demonstrates mild distension, fecalized contents, wall thickening, and perienteric fat stranding. Findings are suspicious for enteritis, may be infectious or inflammatory. No bowel perforation. 2. Mild sigmoid diverticulosis without focal diverticulitis. 3. Moderate-sized fat containing umbilical hernia. No bowel involvement or inflammation. 4. Sequela of chronic renal disease with renal parenchymal atrophy. Aortic Atherosclerosis (ICD10-I70.0). Electronically Signed   By: Keith Rake M.D.   On: 08/05/2021 18:51   DG Chest Port 1 View  Result Date: 08/05/2021 CLINICAL DATA:  Shortness of breath and fevers, initial encounter EXAM: PORTABLE CHEST 1 VIEW COMPARISON:  03/01/2016 FINDINGS: Cardiac shadow is within normal limits. Lungs are well aerated bilaterally. Patchy left basilar airspace opacity is noted consistent with atelectasis as it was not seen on recent CT. No bony abnormality is noted. IMPRESSION: Mild left basilar atelectasis new from prior CT examination. Electronically Signed   By: Inez Catalina M.D.   On: 08/05/2021 20:21   DG ABD ACUTE 2+V W 1V CHEST  Result Date: 08/05/2021 CLINICAL DATA:  Abdominal pain, fever, previous tobacco abuse EXAM: DG ABDOMEN ACUTE WITH 1 VIEW CHEST COMPARISON:  02/22/2018 FINDINGS: Supine and upright frontal views of the abdomen and pelvis are obtained, excluding the flanks by collimation. Upright frontal view of  the chest was also performed. Cardiac silhouette is unremarkable. No airspace disease, effusion, or pneumothorax. Bowel gas pattern is unremarkable without obstruction or ileus. There is moderate stool throughout the colon. No masses or abnormal calcifications. No free gas in the greater peritoneal sac. Left convex scoliosis centered in the upper lumbar spine. Diffuse multilevel thoracolumbar spondylosis. IMPRESSION: 1. Moderate retained stool throughout the colon. 2. No bowel obstruction or ileus. 3. No acute intrathoracic process. Electronically Signed   By: Randa Ngo M.D.   On: 08/05/2021 15:30    Anti-infectives: Anti-infectives (From admission, onward)    Start     Dose/Rate Route Frequency Ordered Stop   08/05/21 2200  piperacillin-tazobactam (ZOSYN) IVPB 2.25 g  Status:  Discontinued        2.25 g 100 mL/hr over 30 Minutes Intravenous Every 8 hours 08/05/21 2004 08/05/21 2005   08/05/21 2015  piperacillin-tazobactam (ZOSYN) IVPB 2.25 g        2.25 g 100 mL/hr over 30 Minutes Intravenous Every 8 hours 08/05/21 2005          Assessment/Plan Right sided abdominal pain Lactic acidosis Leukocytosis  - states her pain hasn't really much improved and is a bit more focal in RLQ. -WB down to 11K today and she is AF -tolerating some CLD, but doesn't really care for them as they  are too sweet  -con abx therapy -cont conservative management and follow.  I do not clinically see evidence for ischemia for which she needs intervention, but will monitor.  FEN - CLD, IVF per TRH and Nephro VTE - SCDs, heparin ID - Zosyn  ESRD on HD HTN DM2 Umbilical hernia - fat containing on CT scan 8/24, reducible   LOS: 1 day    Henreitta Cea , [facility] 08/07/2021, 10:03 AM Please see Amion for pager number during day hours 7:00am-4:30pm

## 2021-08-07 NOTE — Progress Notes (Signed)
[facility] Progress Note  CC:  Enteritis  Subjective:  Pain is not much better, but not worse.  More localized to the RLQ, but still tender diffusely.  Tolerating some clear liquids.  Still no diarrhea, had to have a glycerin suppository last night to have a BM and did pass some stool this morning.  Objective:  Vital signs in last 24 hours: Temp:  [97.9 F (36.6 C)-99.2 F (37.3 C)] 99 F (37.2 C) (08/26 1140) Pulse Rate:  [45-114] 85 (08/26 1200) Resp:  [11-21] 11 (08/26 1200) BP: (100-129)/(31-77) 100/44 (08/26 1200) SpO2:  [88 %-100 %] 96 % (08/26 1200) Weight:  [99.2 kg] 99.2 kg (08/26 1110) Last BM Date: (P) 08/07/21 General:  Alert, Well-developed, in NAD Heart:  Regular rate and rhythm; no murmurs Pulm:  CTAB.  No W/R/R. Abdomen:  Soft, non-distended.  BS present.  Diffuse TTP>in the RLQ. Extremities:  Without edema. Neurologic:  Alert and oriented x 4;  grossly normal neurologically. Psych:  Alert and cooperative. Normal mood and affect.  Intake/Output from previous day: 08/25 0701 - 08/26 0700 In: 1594 [P.O.:480; I.V.:1030; IV Piggyback:84] Out: -   Lab Results: Recent Labs    08/05/21 1816 08/06/21 0139 08/07/21 0341  WBC 25.2* 22.5* 11.3*  HGB 11.7* 10.7* 10.1*  HCT 36.4 34.4* 31.2*  PLT 181 148* 167   BMET Recent Labs    08/05/21 1816 08/06/21 0139 08/07/21 0341  NA 131* 130* 131*  K 4.3 5.0 5.5*  CL 89* 91* 91*  CO2 25 26 27   GLUCOSE 142* 100* 112*  BUN 42* 46* 55*  CREATININE 4.76* 4.92* 5.73*  CALCIUM 9.9 9.3 9.5   LFT Recent Labs    08/05/21 1557 08/05/21 1816 08/07/21 0341  PROT 7.4   < > 6.2*  ALBUMIN 4.0   < > 2.8*  AST 21   < > 16  ALT 22   < > 18  ALKPHOS 101   < > 77  BILITOT 0.5   < > 1.2  BILIDIR 0.1  --   --    < > = values in this interval not displayed.   PT/INR Recent Labs    08/06/21 0139  LABPROT 13.8  INR 1.1   CT ABDOMEN PELVIS WO CONTRAST  Result Date: 08/05/2021 CLINICAL DATA:   Abdominal pain and fever. Elevated white blood cell count. Dialysis patient. EXAM: CT ABDOMEN AND PELVIS WITHOUT CONTRAST TECHNIQUE: Multidetector CT imaging of the abdomen and pelvis was performed following the standard protocol without IV contrast. COMPARISON:  Radiograph earlier today FINDINGS: Lower chest: No acute airspace disease or pleural effusion. Heart is normal in size. Hepatobiliary: There is no focal hepatic abnormality. Mild gallbladder distention without calcified gallstone. No pericholecystic fat stranding. No biliary dilatation. Pancreas: Mild atrophy.  No ductal dilatation or inflammation. Spleen: Normal in size without focal abnormality. Adrenals/Urinary Tract: Minimal left adrenal thickening. No adrenal nodule. Bilateral renal parenchymal atrophy consistent with chronic renal disease. 3.3 cm cyst in the periphery of the left kidney, with additional smaller renal cortical lesions are too small to characterize. No evidence of suspicious solid lesion. Urinary bladder is near completely empty. Stomach/Bowel: Detailed bowel assessment is limited in the absence of enteric contrast. The stomach is nondistended. Short segment of small bowel in the right mid abdomen demonstrates mild distension, fecalized contents, wall thickening and perienteric fat stranding, for example series 3, image 56. this does not appear to represent a blind-ending tubular structure Meckel's diverticulum, rather a  loop of your tight it small bowel. Normal appendix, series 6, image 36. Moderate volume of stool throughout the colon. There is transverse and sigmoid colonic redundancy. Mild sigmoid diverticulosis without focal diverticulitis. No colonic inflammation. Vascular/Lymphatic: Aortic atherosclerosis. No aortic aneurysm. No portal venous or mesenteric gas. Few prominent mesenteric lymph nodes, in the region of small bowel inflammation. There is no bulky abdominopelvic adenopathy. Reproductive: Small calcified uterine  fibroids. The uterus is atrophic. No adnexal mass. Other: Moderate-sized fat containing umbilical hernia. No bowel involvement or inflammation. There is no ascites or free air. Musculoskeletal: Degenerative change in the spine. No focal bone abnormality or acute osseous findings. IMPRESSION: 1. Short segment of small bowel in the right mid abdomen demonstrates mild distension, fecalized contents, wall thickening, and perienteric fat stranding. Findings are suspicious for enteritis, may be infectious or inflammatory. No bowel perforation. 2. Mild sigmoid diverticulosis without focal diverticulitis. 3. Moderate-sized fat containing umbilical hernia. No bowel involvement or inflammation. 4. Sequela of chronic renal disease with renal parenchymal atrophy. Aortic Atherosclerosis (ICD10-I70.0). Electronically Signed   By: Keith Rake M.D.   On: 08/05/2021 18:51   DG Chest Port 1 View  Result Date: 08/05/2021 CLINICAL DATA:  Shortness of breath and fevers, initial encounter EXAM: PORTABLE CHEST 1 VIEW COMPARISON:  03/01/2016 FINDINGS: Cardiac shadow is within normal limits. Lungs are well aerated bilaterally. Patchy left basilar airspace opacity is noted consistent with atelectasis as it was not seen on recent CT. No bony abnormality is noted. IMPRESSION: Mild left basilar atelectasis new from prior CT examination. Electronically Signed   By: Inez Catalina M.D.   On: 08/05/2021 20:21   DG ABD ACUTE 2+V W 1V CHEST  Result Date: 08/05/2021 CLINICAL DATA:  Abdominal pain, fever, previous tobacco abuse EXAM: DG ABDOMEN ACUTE WITH 1 VIEW CHEST COMPARISON:  02/22/2018 FINDINGS: Supine and upright frontal views of the abdomen and pelvis are obtained, excluding the flanks by collimation. Upright frontal view of the chest was also performed. Cardiac silhouette is unremarkable. No airspace disease, effusion, or pneumothorax. Bowel gas pattern is unremarkable without obstruction or ileus. There is moderate stool  throughout the colon. No masses or abnormal calcifications. No free gas in the greater peritoneal sac. Left convex scoliosis centered in the upper lumbar spine. Diffuse multilevel thoracolumbar spondylosis. IMPRESSION: 1. Moderate retained stool throughout the colon. 2. No bowel obstruction or ileus. 3. No acute intrathoracic process. Electronically Signed   By: Randa Ngo M.D.   On: 08/05/2021 15:30    Assessment / Plan: *70 year old female who presents with complaints of ongoing abdominal pain and CT scan showing short segment of small bowel in the right mid abdomen demonstrates mild distension, fecalized contents, wall thickening, and perienteric fat stranding.  White blood cell count elevated at 26.8 and a elevated lactic acid that resolved with hydration.  Pain reportedly started during dialysis.  At that point she was also noted to have low blood pressure.  Had a fever of 100.8.  Surgery has evaluated the patient as well.  Suspicious for hypoperfusion/transient ischemia in the setting of dialysis.  ? Infectious source but no diarrhea.  Must also consider IBD and celiac - particularly given her history of lymphocytic colitis in 2010.  WBC count down to 11.3K today. *End-stage renal disease on HD TTS  -Continue Zosyn empirically for now. -Trend labs. -Continue dicyclomine. -If she does not improve in the next 24 hours or certainly if she worsens then consider CTE. -Continue clear liquids for now.   LOS:  1 day   [name]. Glyn Zendejas  08/07/2021, 2:33 PM

## 2021-08-07 NOTE — Progress Notes (Addendum)
PROGRESS NOTE    [name]  WRU:[id number] DOB: 03-17-1951 DOA: 08/05/2021 PCP: Anita Anger, MD   No chief complaint on file.  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  She was sent to ED for further evaluation after being found to have leukocytosis in the 20,000s.    Assessment & Plan:   Principal Problem:   Sepsis ([name]) Active Problems:   Diabetes mellitus type 2, controlled ([name])   HYPERCHOLESTEROLEMIA   Anemia   Anxiety disorder   Depression   Essential hypertension   GERD   LOW BACK PAIN   ESRD (end stage renal disease) on dialysis ([name])   Lumbar spondylosis with myelopathy   Enteritis  Enteritis Imaging concerning for infectious vs inflammatory enteritis.  Ischemia on differential. Continue zosyn Clear liquids General surgery c/s, appreciate recommendations GI consult, appreciate recs - if not improving within 24 hrs or so, consider CTE  ESRD Dialysis (TTS) per renal  (dialysis this AM) [facility]  K up this AM, follow with dialysis  Anxiety  Depression Wellbutrin, cymbalta Ativan prn  Chronic Pain Cymbalta Oxycodone prn  T2DM Holding oral meds SSI  Anemia Trend  Hx Colitis Noted, GI c/s as noted above  GERD  PPI  DVT prophylaxis: heparin Code Status: full  Family Communication: none at bedside Disposition:   Status is: Observation  The patient remains OBS appropriate and will d/c before 2 midnights.  Dispo: The patient is from: Home              Anticipated d/c is to: Home              Patient currently is not medically stable to d/c.   Difficult to place patient No       Consultants:  GI General surgery Renal   Procedures:  none  Antimicrobials:  Anti-infectives (From admission, onward)    Start     Dose/Rate Route  Frequency Ordered Stop   08/05/21 2200  piperacillin-tazobactam (ZOSYN) IVPB 2.25 g  Status:  Discontinued        2.25 g 100 mL/hr over 30 Minutes Intravenous Every 8 hours 08/05/21 2004 08/05/21 2005   08/05/21 2015  piperacillin-tazobactam (ZOSYN) IVPB 2.25 g        2.25 g 100 mL/hr over 30 Minutes Intravenous Every 8 hours 08/05/21 2005            Subjective: C/o continued pain, in RLQ  Objective: Vitals:   08/07/21 1058 08/07/21 1110 08/07/21 1140 08/07/21 1200  BP: (!) 121/53 (!) 118/48 (!) 112/31 (!) 100/44  Pulse:   88 85  Resp: 14 14 19 11   Temp: 97.9 F (36.6 C) 97.9 F (36.6 C) 99 F (37.2 C)   TempSrc: Oral Oral Oral   SpO2: 98% 98% 98% 96%  Weight:  99.2 kg    Height:        Intake/Output Summary (Last 24 hours) at 08/07/2021 1506 Last data filed at 08/07/2021 1058 Gross per 24 hour  Intake 480 ml  Output 0 ml  Net 480 ml   Filed Weights   08/05/21 2020 08/07/21 0743 08/07/21 1110  Weight: 98.4 kg 99.2 kg 99.2 kg    Examination:  General: No acute distress. Cardiovascular: RRR Lungs: unlabored Abdomen: RLE TTP, diffuse mild TTP Neurological: Alert and oriented 3.  Moves all extremities 4. Cranial nerves II through XII grossly intact. Skin: Warm and dry. No rashes or lesions. Extremities: No clubbing or cyanosis. No edema.      Data Reviewed: I have personally reviewed following labs and imaging studies  CBC: Recent Labs  Lab 08/05/21 1557 08/05/21 1816 08/06/21 0139 08/07/21 0341  WBC 26.8 Repeated and verified X2.* 25.2* 22.5* 11.3*  NEUTROABS 22.6* 21.3*  --  8.0*  HGB 11.0* 11.7* 10.7* 10.1*  HCT 33.6* 36.4 34.4* 31.2*  MCV 99.8 104.0* 108.2* 102.6*  PLT 198.0 181 148* 400    Basic Metabolic Panel: Recent Labs  Lab 08/05/21 1557 08/05/21 1816 08/06/21 0139 08/07/21 0341  NA 130* 131* 130* 131*  K 5.0 4.3 5.0 5.5*  CL 87* 89* 91* 91*  CO2 28 25 26 27   GLUCOSE 145* 142* 100* 112*  BUN 42* 42* 46* 55*  CREATININE 4.49*  4.76* 4.92* 5.73*  CALCIUM 10.3 9.9 9.3 9.5  MG  --   --   --  2.6*  PHOS  --   --   --  4.9*    GFR: Estimated Creatinine Clearance: 10.2 mL/min ([name]) (by C-G formula based on SCr of 5.73 mg/dL (H)).  Liver Function Tests: Recent Labs  Lab 08/05/21 1557 08/05/21 1816 08/06/21 0139 08/07/21 0341  AST 21 27 22 16   ALT 22 25 20 18   ALKPHOS 101 93 82 77  BILITOT 0.5 1.0 0.8 1.2  PROT 7.4 7.3 6.4* 6.2*  ALBUMIN 4.0 3.6 3.1* 2.8*    CBG: Recent Labs  Lab 08/06/21 0755 08/06/21 1125 08/06/21 1532 08/07/21 0724 08/07/21 1141  GLUCAP 63* 119* 76 114* 87     Recent Results (from the past 240 hour(s))  SARS CORONAVIRUS 2 (TAT 6-24 HRS) Nasopharyngeal Nasopharyngeal Swab     Status: None   Collection Time: 08/05/21  9:17 PM   Specimen: Nasopharyngeal Swab  Result Value Ref Range Status   SARS Coronavirus 2 NEGATIVE NEGATIVE Final    Comment: (NOTE) SARS-CoV-2 target nucleic acids are NOT DETECTED.  The SARS-CoV-2 RNA is generally detectable in upper and lower respiratory specimens during the acute phase of infection. Negative results do not preclude SARS-CoV-2 infection, do not rule out co-infections with other pathogens, and should not be used as the sole basis for treatment or other patient management decisions. Negative results must be combined with clinical observations, patient history, and epidemiological information. The expected result is Negative.  Fact Sheet for Patients: SugarRoll.be  Fact Sheet for Healthcare Providers: https://www.woods-mathews.com/  This test is not yet approved or cleared by the [place] FDA and  has been authorized for detection and/or diagnosis of SARS-CoV-2 by FDA under an Emergency Use Authorization (EUA). This EUA will remain  in effect (meaning this test can be used) for the duration of the COVID-19 declaration under Se ction 564(b)(1) of the Act, 21 U.S.C. section 360bbb-3(b)(1),  unless the authorization is terminated or revoked sooner.  Performed at [facility], [place] [address] [place], [place] [address]          Radiology Studies: CT ABDOMEN PELVIS WO CONTRAST  Result Date: 08/05/2021 CLINICAL DATA:  Abdominal pain and fever. Elevated white blood cell count. Dialysis patient. EXAM: CT ABDOMEN AND PELVIS WITHOUT CONTRAST TECHNIQUE: Multidetector CT imaging of the abdomen and pelvis was performed following the standard protocol without IV contrast. COMPARISON:  Radiograph earlier today FINDINGS: Lower chest: No acute airspace disease or pleural effusion. Heart is normal in size. Hepatobiliary:  There is no focal hepatic abnormality. Mild gallbladder distention without calcified gallstone. No pericholecystic fat stranding. No biliary dilatation. Pancreas: Mild atrophy.  No ductal dilatation or inflammation. Spleen: Normal in size without focal abnormality. Adrenals/Urinary Tract: Minimal left adrenal thickening. No adrenal nodule. Bilateral renal parenchymal atrophy consistent with chronic renal disease. 3.3 cm cyst in the periphery of the left kidney, with additional smaller renal cortical lesions are too small to characterize. No evidence of suspicious solid lesion. Urinary bladder is near completely empty. Stomach/Bowel: Detailed bowel assessment is limited in the absence of enteric contrast. The stomach is nondistended. Short segment of small bowel in the right mid abdomen demonstrates mild distension, fecalized contents, wall thickening and perienteric fat stranding, for example series 3, image 56. this does not appear to represent Bianca Raneri blind-ending tubular structure Meckel's diverticulum, rather Damiana Berrian loop of your tight it small bowel. Normal appendix, series 6, image 36. Moderate volume of stool throughout the colon. There is transverse and sigmoid colonic redundancy. Mild sigmoid diverticulosis without focal diverticulitis. No colonic inflammation. Vascular/Lymphatic:  Aortic atherosclerosis. No aortic aneurysm. No portal venous or mesenteric gas. Few prominent mesenteric lymph nodes, in the region of small bowel inflammation. There is no bulky abdominopelvic adenopathy. Reproductive: Small calcified uterine fibroids. The uterus is atrophic. No adnexal mass. Other: Moderate-sized fat containing umbilical hernia. No bowel involvement or inflammation. There is no ascites or free air. Musculoskeletal: Degenerative change in the spine. No focal bone abnormality or acute osseous findings. IMPRESSION: 1. Short segment of small bowel in the right mid abdomen demonstrates mild distension, fecalized contents, wall thickening, and perienteric fat stranding. Findings are suspicious for enteritis, may be infectious or inflammatory. No bowel perforation. 2. Mild sigmoid diverticulosis without focal diverticulitis. 3. Moderate-sized fat containing umbilical hernia. No bowel involvement or inflammation. 4. Sequela of chronic renal disease with renal parenchymal atrophy. Aortic Atherosclerosis (ICD10-I70.0). Electronically Signed   By: Keith Rake M.D.   On: 08/05/2021 18:51   DG Chest Port 1 View  Result Date: 08/05/2021 CLINICAL DATA:  Shortness of breath and fevers, initial encounter EXAM: PORTABLE CHEST 1 VIEW COMPARISON:  03/01/2016 FINDINGS: Cardiac shadow is within normal limits. Lungs are well aerated bilaterally. Patchy left basilar airspace opacity is noted consistent with atelectasis as it was not seen on recent CT. No bony abnormality is noted. IMPRESSION: Mild left basilar atelectasis new from prior CT examination. Electronically Signed   By: Inez Catalina M.D.   On: 08/05/2021 20:21        Scheduled Meds:  buPROPion  300 mg Oral QHS   calcitRIOL  1.5 mcg Oral Q T,Th,Sa-HD   darbepoetin (ARANESP) injection - DIALYSIS  60 mcg Intravenous Q Fri-HD   dicyclomine  10 mg Oral Q8H   DULoxetine  60 mg Oral QHS   heparin injection (subcutaneous)  5,000 Units Subcutaneous  Q8H   insulin aspart  0-6 Units Subcutaneous TID WC   pantoprazole  40 mg Oral Daily   pilocarpine  5 mg Oral BID   sevelamer carbonate  1,600 mg Oral TID WC   sodium chloride flush  3 mL Intravenous Q12H   Continuous Infusions:  piperacillin-tazobactam (ZOSYN)  IV 2.25 g (08/07/21 1210)     LOS: 1 day    Time spent: over 30 min    Fayrene Helper, MD [facility]   To contact the attending provider between 7A-7P or the covering provider during after hours 7P-7A, please log into the web site [email or web address] and access using universal [place]  password for that web site. If you do not have the password, please call the hospital operator.  08/07/2021, 3:06 PM

## 2021-08-08 LAB — CBC WITH DIFFERENTIAL/PLATELET
Abs Immature Granulocytes: 0.05 10*3/uL (ref 0.00–0.07)
Basophils Absolute: 0 10*3/uL (ref 0.0–0.1)
Basophils Relative: 0 %
Eosinophils Absolute: 0.4 10*3/uL (ref 0.0–0.5)
Eosinophils Relative: 3 %
HCT: 27.1 % — ABNORMAL LOW (ref 36.0–46.0)
Hemoglobin: 8.7 g/dL — ABNORMAL LOW (ref 12.0–15.0)
Immature Granulocytes: 1 %
Lymphocytes Relative: 23 %
Lymphs Abs: 2.5 10*3/uL (ref 0.7–4.0)
MCH: 32.6 pg (ref 26.0–34.0)
MCHC: 32.1 g/dL (ref 30.0–36.0)
MCV: 101.5 fL — ABNORMAL HIGH (ref 80.0–100.0)
Monocytes Absolute: 1.3 10*3/uL — ABNORMAL HIGH (ref 0.1–1.0)
Monocytes Relative: 12 %
Neutro Abs: 6.8 10*3/uL (ref 1.7–7.7)
Neutrophils Relative %: 61 %
Platelets: 145 10*3/uL — ABNORMAL LOW (ref 150–400)
RBC: 2.67 MIL/uL — ABNORMAL LOW (ref 3.87–5.11)
RDW: 13.1 % (ref 11.5–15.5)
WBC: 11.1 10*3/uL — ABNORMAL HIGH (ref 4.0–10.5)
nRBC: 0 % (ref 0.0–0.2)

## 2021-08-08 LAB — COMPREHENSIVE METABOLIC PANEL
ALT: 14 U/L (ref 0–44)
AST: 14 U/L — ABNORMAL LOW (ref 15–41)
Albumin: 2.3 g/dL — ABNORMAL LOW (ref 3.5–5.0)
Alkaline Phosphatase: 90 U/L (ref 38–126)
Anion gap: 10 (ref 5–15)
BUN: 22 mg/dL (ref 8–23)
CO2: 28 mmol/L (ref 22–32)
Calcium: 8.5 mg/dL — ABNORMAL LOW (ref 8.9–10.3)
Chloride: 94 mmol/L — ABNORMAL LOW (ref 98–111)
Creatinine, Ser: 3.34 mg/dL — ABNORMAL HIGH (ref 0.44–1.00)
GFR, Estimated: 14 mL/min — ABNORMAL LOW (ref >60)
Glucose, Bld: 99 mg/dL (ref 70–99)
Potassium: 3.4 mmol/L — ABNORMAL LOW (ref 3.5–5.1)
Sodium: 132 mmol/L — ABNORMAL LOW (ref 135–145)
Total Bilirubin: 0.9 mg/dL (ref 0.3–1.2)
Total Protein: 5.5 g/dL — ABNORMAL LOW (ref 6.5–8.1)

## 2021-08-08 LAB — GLUCOSE, CAPILLARY
Glucose-Capillary: 145 mg/dL — ABNORMAL HIGH (ref 70–99)
Glucose-Capillary: 119 mg/dL — ABNORMAL HIGH (ref 70–99)
Glucose-Capillary: 109 mg/dL — ABNORMAL HIGH (ref 70–99)
Glucose-Capillary: 113 mg/dL — ABNORMAL HIGH (ref 70–99)

## 2021-08-08 LAB — HEMOGLOBIN AND HEMATOCRIT, BLOOD
HCT: 27.8 % — ABNORMAL LOW (ref 36.0–46.0)
Hemoglobin: 9 g/dL — ABNORMAL LOW (ref 12.0–15.0)

## 2021-08-08 LAB — MAGNESIUM: Magnesium: 2.1 mg/dL (ref 1.7–2.4)

## 2021-08-08 LAB — PHOSPHORUS: Phosphorus: 3.6 mg/dL (ref 2.5–4.6)

## 2021-08-08 MED ORDER — PROSOURCE PLUS PO LIQD
30.0000 mL | Freq: Two times a day (BID) | ORAL | Status: DC
  Administered 2021-08-08 – 2021-08-11 (×5): 30 mL via ORAL
  Filled 2021-08-08 (×5): qty 30

## 2021-08-08 MED ORDER — DARBEPOETIN ALFA 60 MCG/0.3ML IJ SOSY: 60.0000 ug | PREFILLED_SYRINGE | INTRAMUSCULAR | Status: DC

## 2021-08-08 NOTE — Progress Notes (Signed)
PROGRESS NOTE    [name]  VQM:[id number] DOB: 12-Sep-1951 DOA: 08/05/2021 PCP: Cassandria Anger, MD   No chief complaint on file.  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  She was sent to ED for further evaluation after being found to have leukocytosis in the 20,000s.    Assessment & Plan:   Principal Problem:   Sepsis ([place]) Active Problems:   Diabetes mellitus type 2, controlled ([place])   HYPERCHOLESTEROLEMIA   Anemia   Anxiety disorder   Depression   Essential hypertension   GERD   LOW BACK PAIN   ESRD (end stage renal disease) on dialysis ([name])   Lumbar spondylosis with myelopathy   Enteritis  Enteritis Imaging concerning for infectious vs inflammatory enteritis.  Ischemia on differential. Continue zosyn Full liquids General surgery c/s, appreciate recommendations GI consult, appreciate recs - if not improving within 24 hrs or so, consider CTE  Hypotension Several soft BP's overnight - SBP in 90's, 80's (1 in 60's, accurate?), she's not overtly symptomatic At bedside SBP 100 on recheck Follow repeat Hb  ESRD Dialysis (TTS) per renal  (dialysis this AM) [facility]   Anxiety  Depression Wellbutrin, cymbalta Ativan prn  Chronic Pain Cymbalta Oxycodone prn  T2DM Holding oral meds SSI  Anemia Downtrend today - follow closely (will repeat around noon)  Hx Colitis Noted, GI c/s as noted above  GERD  PPI  DVT prophylaxis: heparin Code Status: full  Family Communication: none at bedside Disposition:   Status is: Observation  The patient remains OBS appropriate and will d/c before 2 midnights.  Dispo: The patient is from: Home              Anticipated d/c is to: Home              Patient currently is not medically stable to d/c.   Difficult to place  patient No       Consultants:  GI General surgery Renal   Procedures:  none  Antimicrobials:  Anti-infectives (From admission, onward)    Start     Dose/Rate Route Frequency Ordered Stop   08/05/21 2200  piperacillin-tazobactam (ZOSYN) IVPB 2.25 g  Status:  Discontinued        2.25 g 100 mL/hr over 30 Minutes Intravenous Every 8 hours 08/05/21 2004 08/05/21 2005   08/05/21 2015  piperacillin-tazobactam (ZOSYN) IVPB 2.25 g        2.25 g 100 mL/hr over 30 Minutes Intravenous Every 8 hours 08/05/21 2005            Subjective: Feels better overall, pleased so far with improvement  Objective: Vitals:   08/07/21 2032 08/07/21 2332 08/08/21 0618 08/08/21 0631  BP: (!) 94/41 (!) 85/40 (!) 68/31 (!) 90/37  Pulse: 77 82 63 70  Resp: 16 18 14 14   Temp: 98.1 F (36.7 C) 98.3 F (36.8 C) 97.9 F (36.6 C)   TempSrc: Oral Oral Oral   SpO2: 95% 94% 94%   Weight:      Height:        Intake/Output Summary (Last 24 hours) at 08/08/2021 0930 Last data filed at 08/07/2021 1700 Gross per 24 hour  Intake 523.78 ml  Output 0 ml  Net 523.78 ml   Filed Weights   08/05/21 2020 08/07/21 0743 08/07/21 1110  Weight: 98.4 kg 99.2 kg 99.2 kg    Examination:  General: No acute distress. Cardiovascular: RRR - BP repeated at bedside, SBP 100 Lungs: unlabored Abdomen: mild TTP in RLQ and upper - improving Neurological: Alert and oriented 3. Moves all extremities 4 . Cranial nerves II through XII grossly intact. Skin: Warm and dry. No rashes or lesions. Extremities: No clubbing or cyanosis. No edema.       Data Reviewed: I have personally reviewed following labs and imaging studies  CBC: Recent Labs  Lab 08/05/21 1557 08/05/21 1816 08/06/21 0139 08/07/21 0341 08/08/21 0224  WBC 26.8 Repeated and verified X2.* 25.2* 22.5* 11.3* 11.1*  NEUTROABS 22.6* 21.3*  --  8.0* 6.8  HGB 11.0* 11.7* 10.7* 10.1* 8.7*  HCT 33.6* 36.4 34.4* 31.2* 27.1*  MCV 99.8 104.0* 108.2*  102.6* 101.5*  PLT 198.0 181 148* 167 145*    Basic Metabolic Panel: Recent Labs  Lab 08/05/21 1557 08/05/21 1816 08/06/21 0139 08/07/21 0341 08/08/21 0224  NA 130* 131* 130* 131* 132*  K 5.0 4.3 5.0 5.5* 3.4*  CL 87* 89* 91* 91* 94*  CO2 28 25 26 27 28   GLUCOSE 145* 142* 100* 112* 99  BUN 42* 42* 46* 55* 22  CREATININE 4.49* 4.76* 4.92* 5.73* 3.34*  CALCIUM 10.3 9.9 9.3 9.5 8.5*  MG  --   --   --  2.6* 2.1  PHOS  --   --   --  4.9* 3.6    GFR: Estimated Creatinine Clearance: 17.5 mL/min (Azhia Siefken) (by C-G formula based on SCr of 3.34 mg/dL (H)).  Liver Function Tests: Recent Labs  Lab 08/05/21 1557 08/05/21 1816 08/06/21 0139 08/07/21 0341 08/08/21 0224  AST 21 27 22 16  14*  ALT 22 25 20 18 14   ALKPHOS 101 93 82 77 90  BILITOT 0.5 1.0 0.8 1.2 0.9  PROT 7.4 7.3 6.4* 6.2* 5.5*  ALBUMIN 4.0 3.6 3.1* 2.8* 2.3*    CBG: Recent Labs  Lab 08/07/21 0724 08/07/21 1141 08/07/21 1558 08/07/21 2345 08/08/21 0742  GLUCAP 114* 87 140* 101* 145*     Recent Results (from the past 240 hour(s))  SARS CORONAVIRUS 2 (TAT 6-24 HRS) Nasopharyngeal Nasopharyngeal Swab     Status: None   Collection Time: 08/05/21  9:17 PM   Specimen: Nasopharyngeal Swab  Result Value Ref Range Status   SARS Coronavirus 2 NEGATIVE NEGATIVE Final    Comment: (NOTE) SARS-CoV-2 target nucleic acids are NOT DETECTED.  The SARS-CoV-2 RNA is generally detectable in upper and lower respiratory specimens during the acute phase of infection. Negative results do not preclude SARS-CoV-2 infection, do not rule out co-infections with other pathogens, and should not be used as the sole basis for treatment or other patient management decisions. Negative results must be combined with clinical observations, patient history, and epidemiological information. The expected result is Negative.  Fact Sheet for Patients: SugarRoll.be  Fact Sheet for Healthcare  Providers: https://www.woods-mathews.com/  This test is not yet approved or cleared by the [place] FDA and  has been authorized for detection and/or diagnosis of SARS-CoV-2 by FDA under an Emergency Use Authorization (EUA). This EUA will remain  in effect (meaning this test can be used) for the duration of the COVID-19 declaration under Se ction 564(b)(1) of the Act, 21 U.S.C. section 360bbb-3(b)(1), unless the authorization is terminated or revoked sooner.  Performed at [facility], [place] [address] [place], [place] [address]  Radiology Studies: No results found.      Scheduled Meds:  buPROPion  300 mg Oral QHS   calcitRIOL  1.5 mcg Oral Q T,Th,Sa-HD   darbepoetin (ARANESP) injection - DIALYSIS  60 mcg Intravenous Q Fri-HD   dicyclomine  10 mg Oral Q8H   DULoxetine  60 mg Oral QHS   heparin injection (subcutaneous)  5,000 Units Subcutaneous Q8H   insulin aspart  0-6 Units Subcutaneous TID WC   pantoprazole  40 mg Oral Daily   pilocarpine  5 mg Oral BID   sevelamer carbonate  1,600 mg Oral TID WC   sodium chloride flush  3 mL Intravenous Q12H   Continuous Infusions:  piperacillin-tazobactam (ZOSYN)  IV 2.25 g (08/08/21 0329)     LOS: 2 days    Time spent: over 30 min    Fayrene Helper, MD [facility]   To contact the attending provider between 7A-7P or the covering provider during after hours 7P-7A, please log into the web site [email or web address] and access using universal [place] password for that web site. If you do not have the password, please call the hospital operator.  08/08/2021, 9:30 AM

## 2021-08-08 NOTE — Progress Notes (Signed)
[facility] Progress Note   Subjective:   Patient seen and examined at bedside.  Feeling much better today, abdominal pain mostly resolved now just "sore".  Able to get up and walk around with minimal pain.  Denies CP, SOB, n/v/d, weakness and fatigue.   Objective Vitals:   08/07/21 2032 08/07/21 2332 08/08/21 0618 08/08/21 0631  BP: (!) 94/41 (!) 85/40 (!) 68/31 (!) 90/37  Pulse: 77 82 63 70  Resp: 16 18 14 14   Temp: 98.1 F (36.7 C) 98.3 F (36.8 C) 97.9 F (36.6 C)   TempSrc: Oral Oral Oral   SpO2: 95% 94% 94%   Weight:      Height:       Physical Exam General:well appearing female in NAD Heart:RRR, no mrg Lungs:CTAB, nml WOB on RA Abdomen:soft, NTND Extremities:no LE edema Dialysis Access: RU AVF +b/t   Filed Weights   08/05/21 2020 08/07/21 0743 08/07/21 1110  Weight: 98.4 kg 99.2 kg 99.2 kg    Intake/Output Summary (Last 24 hours) at 08/08/2021 1323 Last data filed at 08/07/2021 1700 Gross per 24 hour  Intake 283.78 ml  Output --  Net 283.78 ml    Additional Objective Labs: Basic Metabolic Panel: Recent Labs  Lab 08/06/21 0139 08/07/21 0341 08/08/21 0224  NA 130* 131* 132*  K 5.0 5.5* 3.4*  CL 91* 91* 94*  CO2 26 27 28   GLUCOSE 100* 112* 99  BUN 46* 55* 22  CREATININE 4.92* 5.73* 3.34*  CALCIUM 9.3 9.5 8.5*  PHOS  --  4.9* 3.6   Liver Function Tests: Recent Labs  Lab 08/06/21 0139 08/07/21 0341 08/08/21 0224  AST 22 16 14*  ALT 20 18 14   ALKPHOS 82 77 90  BILITOT 0.8 1.2 0.9  PROT 6.4* 6.2* 5.5*  ALBUMIN 3.1* 2.8* 2.3*   Recent Labs  Lab 08/05/21 1557 08/05/21 1816  LIPASE 5.0* 20  AMYLASE 20*  --    CBC: Recent Labs  Lab 08/05/21 1557 08/05/21 1816 08/06/21 0139 08/07/21 0341 08/08/21 0224 08/08/21 1157  WBC 26.8 Repeated and verified X2.* 25.2* 22.5* 11.3* 11.1*  --   NEUTROABS 22.6* 21.3*  --  8.0* 6.8  --   HGB 11.0* 11.7* 10.7* 10.1* 8.7* 9.0*  HCT 33.6* 36.4 34.4* 31.2* 27.1* 27.8*  MCV 99.8 104.0*  108.2* 102.6* 101.5*  --   PLT 198.0 181 148* 167 145*  --    CBG: Recent Labs  Lab 08/07/21 1141 08/07/21 1558 08/07/21 2345 08/08/21 0742 08/08/21 1137  GLUCAP 87 140* 101* 145* 119*    Medications:  piperacillin-tazobactam (ZOSYN)  IV 2.25 g (08/08/21 1145)    buPROPion  300 mg Oral QHS   calcitRIOL  1.5 mcg Oral Q T,Th,Sa-HD   darbepoetin (ARANESP) injection - DIALYSIS  60 mcg Intravenous Q Fri-HD   dicyclomine  10 mg Oral Q8H   DULoxetine  60 mg Oral QHS   heparin injection (subcutaneous)  5,000 Units Subcutaneous Q8H   insulin aspart  0-6 Units Subcutaneous TID WC   pantoprazole  40 mg Oral Daily   pilocarpine  5 mg Oral BID   sevelamer carbonate  1,600 mg Oral TID WC   sodium chloride flush  3 mL Intravenous Q12H    Dialysis Orders: [facility], TTS 4-hour, 97 edw  2K, 2 CA bath, UF profile 2  Heparin 5000  Calcitriol 1.5 mic  Mircera 60 every 2 weeks starting 8/25 Right UA aVF     Assessment/Plan   Abdominal pain/enteritis infectious  versus inflammatory enteritis, ischemia - GI and surgery following. WBC improved from 22K> 11K.  Feeling much better today. ESRD -HD TTS.  HD off schedule 8/26 due to staffing issues/increased patient census.  Will plan for HD again Mon or Tues pending labs/volume status.  Labs stable with SCr 3.34, BUN 22 and K 3.4. If discharged can resume OP HD on Tuesday. Hypertension/volume  - BP stable, not on antihypertensives.  Does not appear volume overloaded.  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 Continue to follow.  Metabolic bone disease -Corrected Ca elevated, use low Ca bath, hold VDRA.  phos in goal, continue renvela. Anxiety/depression - on meds per admit Type 2 diabetes mellitus- Rx per admit Nutrition - Alb 2.6.  Protein supplements.  Jen Mow, PA-C [facility] 08/08/2021,1:23 PM  LOS: 2 days

## 2021-08-08 NOTE — Progress Notes (Signed)
Subjective: CC: Patient reports that she feels much better today. She reports the difference between yesterday and today is "day and night". She denies any abdominal pain at rest or with ambulation. No n/v. Hard formed BM yesterday. Tolerating cld but doesn't like options. Afebrile. WBC down.   Objective: Vital signs in last 24 hours: Temp:  [97.9 F (36.6 C)-99.1 F (37.3 C)] 97.9 F (36.6 C) (08/27 0618) Pulse Rate:  [63-88] 70 (08/27 0631) Resp:  [11-20] 14 (08/27 0631) BP: (68-121)/(30-54) 90/37 (08/27 0631) SpO2:  [94 %-100 %] 94 % (08/27 0618) Weight:  [99.2 kg] 99.2 kg (08/26 1110) Last BM Date: 08/07/21  Intake/Output from previous day: 08/26 0701 - 08/27 0700 In: 523.8 [P.O.:360; IV Piggyback:163.8] Out: 0  Intake/Output this shift: No intake/output data recorded.  PE: Gen:  Alert, NAD, pleasant Pulm:  Normal rate and effort  Abd: Soft, ND, very minimal tenderness of the suprapubic and rlq that patient reports is improved from yesterday. Otherwise NT. No rebound, rigidity or guarding. +BS. Psych: A&Ox3   Lab Results:  Recent Labs    08/07/21 0341 08/08/21 0224  WBC 11.3* 11.1*  HGB 10.1* 8.7*  HCT 31.2* 27.1*  PLT 167 145*   BMET Recent Labs    08/07/21 0341 08/08/21 0224  NA 131* 132*  K 5.5* 3.4*  CL 91* 94*  CO2 27 28  GLUCOSE 112* 99  BUN 55* 22  CREATININE 5.73* 3.34*  CALCIUM 9.5 8.5*   PT/INR Recent Labs    08/06/21 0139  LABPROT 13.8  INR 1.1   CMP     Component Value Date/Time   NA 132 (L) 08/08/2021 0224   K 3.4 (L) 08/08/2021 0224   CL 94 (L) 08/08/2021 0224   CO2 28 08/08/2021 0224   GLUCOSE 99 08/08/2021 0224   BUN 22 08/08/2021 0224   CREATININE 3.34 (H) 08/08/2021 0224   CALCIUM 8.5 (L) 08/08/2021 0224   PROT 5.5 (L) 08/08/2021 0224   ALBUMIN 2.3 (L) 08/08/2021 0224   AST 14 (L) 08/08/2021 0224   ALT 14 08/08/2021 0224   ALKPHOS 90 08/08/2021 0224   BILITOT 0.9 08/08/2021 0224   GFRNONAA 14 (L) 08/08/2021  0224   GFRAA 66 (L) 03/07/2015 0449   Lipase     Component Value Date/Time   LIPASE 20 08/05/2021 1816    Studies/Results: No results found.  Anti-infectives: Anti-infectives (From admission, onward)    Start     Dose/Rate Route Frequency Ordered Stop   08/05/21 2200  piperacillin-tazobactam (ZOSYN) IVPB 2.25 g  Status:  Discontinued        2.25 g 100 mL/hr over 30 Minutes Intravenous Every 8 hours 08/05/21 2004 08/05/21 2005   08/05/21 2015  piperacillin-tazobactam (ZOSYN) IVPB 2.25 g        2.25 g 100 mL/hr over 30 Minutes Intravenous Every 8 hours 08/05/21 2005          Assessment/Plan Right sided abdominal pain Lactic acidosis Leukocytosis  -Symptomatically improved today -WB 25.2 > 22.5 > 11.3 > 11.1. She is AF - Tol CLD. Adv  -con abx therapy -cont conservative management and follow.No current indication for surgical management but will continue to follow - Gi following and appreciate their assistance.    FEN - FLD, IVF per TRH and Nephro VTE - SCDs, heparin ID - Zosyn   ESRD on HD HTN DM2 Umbilical hernia - fat containing on CT scan 8/24   LOS: 2 days  Jillyn Ledger , [facility] 08/08/2021, 8:28 AM Please see Amion for pager number during day hours 7:00am-4:30pm

## 2021-08-08 NOTE — Progress Notes (Signed)
Pharmacy Antibiotic Note  [name] is a 70 y.o. female admitted on 08/05/2021 with  suspected intra-abdominal infection .  Pharmacy has been consulted for zosyn dosing. Now likely infectious vs inflammatory enteritis.   Plan: Zosyn 2.25g q8h appropriately dosed for HD F/u for LOT and de-escalate as appropriate  Height: 5\' 2"  (157.5 cm) Weight: 99.2 kg (218 lb 11.1 oz) IBW/kg (Calculated) : 50.1  Temp (24hrs), Avg:98.3 F (36.8 C), Min:97.9 F (36.6 C), Max:99.1 F (37.3 C)  Recent Labs  Lab 08/05/21 1557 08/05/21 1816 08/05/21 1823 08/05/21 2023 08/06/21 0139 08/07/21 0341 08/08/21 0224  WBC 26.8 Repeated and verified X2.* 25.2*  --   --  22.5* 11.3* 11.1*  CREATININE 4.49* 4.76*  --   --  4.92* 5.73* 3.34*  LATICACIDVEN  --   --  3.5* 2.4* 1.3  --   --      Estimated Creatinine Clearance: 17.5 mL/min (A) (by C-G formula based on SCr of 3.34 mg/dL (H)).    Allergies  Allergen Reactions   Erythromycin Diarrhea and Nausea And Vomiting   Gabapentin Other (See Comments)    Confusion and falling    Antimicrobials this admission: zosyn 8/24 >>  Microbiology results:   Thank you for allowing pharmacy to be a part of this patient's care.  Levonne Spiller 08/08/2021 8:01 AM

## 2021-08-08 NOTE — Progress Notes (Signed)
Subjective: Feeling much better.  "Night and day".  Objective: Vital signs in last 24 hours: Temp:  [97.9 F (36.6 C)-99.1 F (37.3 C)] 97.9 F (36.6 C) (08/27 0618) Pulse Rate:  [63-88] 70 (08/27 0631) Resp:  [11-19] 14 (08/27 0631) BP: (68-121)/(30-53) 90/37 (08/27 0631) SpO2:  [94 %-100 %] 94 % (08/27 0618) Weight:  [99.2 kg] 99.2 kg (08/26 1110) Last BM Date: 08/07/21  Intake/Output from previous day: 08/26 0701 - 08/27 0700 In: 523.8 [P.O.:360; IV Piggyback:163.8] Out: 0  Intake/Output this shift: No intake/output data recorded.  General appearance: alert and no distress GI: soft, non-tender; bowel sounds normal; no masses,  no organomegaly  Lab Results: Recent Labs    08/06/21 0139 08/07/21 0341 08/08/21 0224  WBC 22.5* 11.3* 11.1*  HGB 10.7* 10.1* 8.7*  HCT 34.4* 31.2* 27.1*  PLT 148* 167 145*   BMET Recent Labs    08/06/21 0139 08/07/21 0341 08/08/21 0224  NA 130* 131* 132*  K 5.0 5.5* 3.4*  CL 91* 91* 94*  CO2 26 27 28   GLUCOSE 100* 112* 99  BUN 46* 55* 22  CREATININE 4.92* 5.73* 3.34*  CALCIUM 9.3 9.5 8.5*   LFT Recent Labs    08/05/21 1557 08/05/21 1816 08/08/21 0224  PROT 7.4   < > 5.5*  ALBUMIN 4.0   < > 2.3*  AST 21   < > 14*  ALT 22   < > 14  ALKPHOS 101   < > 90  BILITOT 0.5   < > 0.9  BILIDIR 0.1  --   --    < > = values in this interval not displayed.   PT/INR Recent Labs    08/06/21 0139  LABPROT 13.8  INR 1.1   Hepatitis Panel No results for input(s): HEPBSAG, HCVAB, HEPAIGM, HEPBIGM in the last 72 hours. C-Diff No results for input(s): CDIFFTOX in the last 72 hours. Fecal Lactopherrin No results for input(s): FECLLACTOFRN in the last 72 hours.  Studies/Results: No results found.  Medications: Scheduled:  buPROPion  300 mg Oral QHS   calcitRIOL  1.5 mcg Oral Q T,Th,Sa-HD   darbepoetin (ARANESP) injection - DIALYSIS  60 mcg Intravenous Q Fri-HD   dicyclomine  10 mg Oral Q8H   DULoxetine  60 mg Oral QHS    heparin injection (subcutaneous)  5,000 Units Subcutaneous Q8H   insulin aspart  0-6 Units Subcutaneous TID WC   pantoprazole  40 mg Oral Daily   pilocarpine  5 mg Oral BID   sevelamer carbonate  1,600 mg Oral TID WC   sodium chloride flush  3 mL Intravenous Q12H   Continuous:  piperacillin-tazobactam (ZOSYN)  IV 2.25 g (08/08/21 0329)    Assessment/Plan: 1) Enteritis - ischemic versus infectious.  2) ESRD.   [name] is feeling much better.  Her pain markedly improved over the past 24 hours.  No reports of any fever, hematochezia, melena, or diarrhea.  WBC is around 11K and she is on Zosyn.  Plan: 1) Advance to a regular diet.  She can pick and choose what she wants to eat. 2) Continue with Zosyn.  If she is well tomorrow she can be converted over to Augmentin and treated for a total of 10 days.  LOS: 2 days   [name] D 08/08/2021, 9:56 AM

## 2021-08-09 LAB — CBC WITH DIFFERENTIAL/PLATELET
Abs Immature Granulocytes: 0.06 10*3/uL (ref 0.00–0.07)
Basophils Absolute: 0.1 10*3/uL (ref 0.0–0.1)
Basophils Relative: 1 %
Eosinophils Absolute: 0.4 10*3/uL (ref 0.0–0.5)
Eosinophils Relative: 4 %
HCT: 27.4 % — ABNORMAL LOW (ref 36.0–46.0)
Hemoglobin: 8.9 g/dL — ABNORMAL LOW (ref 12.0–15.0)
Immature Granulocytes: 1 %
Lymphocytes Relative: 33 %
Lymphs Abs: 3.3 10*3/uL (ref 0.7–4.0)
MCH: 33.2 pg (ref 26.0–34.0)
MCHC: 32.5 g/dL (ref 30.0–36.0)
MCV: 102.2 fL — ABNORMAL HIGH (ref 80.0–100.0)
Monocytes Absolute: 1 10*3/uL (ref 0.1–1.0)
Monocytes Relative: 10 %
Neutro Abs: 5.1 10*3/uL (ref 1.7–7.7)
Neutrophils Relative %: 51 %
Platelets: 179 10*3/uL (ref 150–400)
RBC: 2.68 MIL/uL — ABNORMAL LOW (ref 3.87–5.11)
RDW: 13.2 % (ref 11.5–15.5)
WBC: 9.9 10*3/uL (ref 4.0–10.5)
nRBC: 0 % (ref 0.0–0.2)

## 2021-08-09 LAB — COMPREHENSIVE METABOLIC PANEL
ALT: 15 U/L (ref 0–44)
AST: 16 U/L (ref 15–41)
Albumin: 2.4 g/dL — ABNORMAL LOW (ref 3.5–5.0)
Alkaline Phosphatase: 121 U/L (ref 38–126)
Anion gap: 12 (ref 5–15)
BUN: 35 mg/dL — ABNORMAL HIGH (ref 8–23)
CO2: 26 mmol/L (ref 22–32)
Calcium: 9.1 mg/dL (ref 8.9–10.3)
Chloride: 96 mmol/L — ABNORMAL LOW (ref 98–111)
Creatinine, Ser: 4.33 mg/dL — ABNORMAL HIGH (ref 0.44–1.00)
GFR, Estimated: 11 mL/min — ABNORMAL LOW (ref >60)
Glucose, Bld: 62 mg/dL — ABNORMAL LOW (ref 70–99)
Potassium: 3.5 mmol/L (ref 3.5–5.1)
Sodium: 134 mmol/L — ABNORMAL LOW (ref 135–145)
Total Bilirubin: 0.7 mg/dL (ref 0.3–1.2)
Total Protein: 5.7 g/dL — ABNORMAL LOW (ref 6.5–8.1)

## 2021-08-09 LAB — MAGNESIUM: Magnesium: 2.3 mg/dL (ref 1.7–2.4)

## 2021-08-09 LAB — PHOSPHORUS: Phosphorus: 4.2 mg/dL (ref 2.5–4.6)

## 2021-08-09 LAB — GLUCOSE, CAPILLARY: Glucose-Capillary: 86 mg/dL (ref 70–99)

## 2021-08-09 NOTE — Progress Notes (Addendum)
Subjective: CC: She notes that she was pain free yesterday morning. After FLD PO intake developed generalized constant abdominal pain that was different from what she was experiencing before. More mild overall. No n/v. Last BM 8/27 but feels constipated.   Objective: Vital signs in last 24 hours: Temp:  [97.7 F (36.5 C)-98.3 F (36.8 C)] 98.3 F (36.8 C) (08/28 0751) Pulse Rate:  [67-78] 67 (08/28 0751) Resp:  [12-19] 12 (08/28 0751) BP: (105-133)/(34-68) 112/39 (08/28 0751) SpO2:  [94 %-100 %] 99 % (08/28 0751) Last BM Date: 08/08/21  Intake/Output from previous day: No intake/output data recorded. Intake/Output this shift: Total I/O In: 324 [P.O.:320; I.V.:4] Out: -   PE: Gen: Awake and alert, NAD. Able to get into bed without worsening abdominal pain Abd: Soft, ND, very mild generalized tenderness, no peritonitis, +BS  Lab Results:  Recent Labs    08/08/21 0224 08/08/21 1157 08/09/21 0325  WBC 11.1*  --  9.9  HGB 8.7* 9.0* 8.9*  HCT 27.1* 27.8* 27.4*  PLT 145*  --  179   BMET Recent Labs    08/08/21 0224 08/09/21 0325  NA 132* 134*  K 3.4* 3.5  CL 94* 96*  CO2 28 26  GLUCOSE 99 62*  BUN 22 35*  CREATININE 3.34* 4.33*  CALCIUM 8.5* 9.1   PT/INR No results for input(s): LABPROT, INR in the last 72 hours. CMP     Component Value Date/Time   NA 134 (L) 08/09/2021 0325   K 3.5 08/09/2021 0325   CL 96 (L) 08/09/2021 0325   CO2 26 08/09/2021 0325   GLUCOSE 62 (L) 08/09/2021 0325   BUN 35 (H) 08/09/2021 0325   CREATININE 4.33 (H) 08/09/2021 0325   CALCIUM 9.1 08/09/2021 0325   PROT 5.7 (L) 08/09/2021 0325   ALBUMIN 2.4 (L) 08/09/2021 0325   AST 16 08/09/2021 0325   ALT 15 08/09/2021 0325   ALKPHOS 121 08/09/2021 0325   BILITOT 0.7 08/09/2021 0325   GFRNONAA 11 (L) 08/09/2021 0325   GFRAA 66 (L) 03/07/2015 0449   Lipase     Component Value Date/Time   LIPASE 20 08/05/2021 1816    Studies/Results: No results  found.  Anti-infectives: Anti-infectives (From admission, onward)    Start     Dose/Rate Route Frequency Ordered Stop   08/05/21 2200  piperacillin-tazobactam (ZOSYN) IVPB 2.25 g  Status:  Discontinued        2.25 g 100 mL/hr over 30 Minutes Intravenous Every 8 hours 08/05/21 2004 08/05/21 2005   08/05/21 2015  piperacillin-tazobactam (ZOSYN) IVPB 2.25 g        2.25 g 100 mL/hr over 30 Minutes Intravenous Every 8 hours 08/05/21 2005          Assessment/Plan Generalized abdominal pain - Felt worse after PO intake but exam with only mild generalized tenderness that is overall stable. WBC normalized. She is afebrile.  - Diet as tolerated.  - Con abx therapy - Cont conservative management. No current indication for surgical management - Gi following - CT enterography if no improvement   FEN - FLD, AAT, IVF per TRH and Nephro VTE - SCDs, heparin ID - Zosyn   ESRD on HD HTN DM2 Umbilical hernia - fat containing on CT scan 8/24  We will sign off for now.  Feel free to call us back if needed.   LOS: 3 days    Jillyn Ledger , [facility] Surgery 08/09/2021, 10:12 AM Please  see Amion for pager number during day hours 7:00am-4:30pm  I personally saw the patient and performed a substantive portion of this encounter, including a complete performance of at least one of the key components (MDM, Hx and/or Exam), in conjunction with the Advanced Practice Provider Alferd Apa, PA-C.  Imogene Burn. Georgette Dover, MD, [facility] Surgery  General Surgery   08/09/2021 10:48 AM

## 2021-08-09 NOTE — Progress Notes (Signed)
[facility] Progress Note   Subjective:   Pt seen in room, still having difficulties w/ eating/ abd pain   Objective Vitals:   08/09/21 0000 08/09/21 0435 08/09/21 0751 08/09/21 1206  BP:  (!) 133/34 (!) 112/39 (!) 117/40  Pulse:   67 71  Resp:   12 15  Temp: 98.3 F (36.8 C) 97.7 F (36.5 C) 98.3 F (36.8 C)   TempSrc: Oral Oral Oral   SpO2:   99% 100%  Weight:      Height:       Physical Exam General:well appearing female in NAD Heart:RRR, no mrg Lungs:CTAB, nml WOB on RA Abdomen:soft, NTND Extremities:no LE edema Dialysis Access: RU AVF +b/t   Filed Weights   08/05/21 2020 08/07/21 0743 08/07/21 1110  Weight: 98.4 kg 99.2 kg 99.2 kg    Intake/Output Summary (Last 24 hours) at 08/09/2021 1421 Last data filed at 08/09/2021 0900 Gross per 24 hour  Intake 324 ml  Output --  Net 324 ml     Additional Objective Labs: Basic Metabolic Panel: Recent Labs  Lab 08/07/21 0341 08/08/21 0224 08/09/21 0325  NA 131* 132* 134*  K 5.5* 3.4* 3.5  CL 91* 94* 96*  CO2 27 28 26   GLUCOSE 112* 99 62*  BUN 55* 22 35*  CREATININE 5.73* 3.34* 4.33*  CALCIUM 9.5 8.5* 9.1  PHOS 4.9* 3.6 4.2    Liver Function Tests: Recent Labs  Lab 08/07/21 0341 08/08/21 0224 08/09/21 0325  AST 16 14* 16  ALT 18 14 15   ALKPHOS 77 90 121  BILITOT 1.2 0.9 0.7  PROT 6.2* 5.5* 5.7*  ALBUMIN 2.8* 2.3* 2.4*    Recent Labs  Lab 08/05/21 1557 08/05/21 1816  LIPASE 5.0* 20  AMYLASE 20*  --     CBC: Recent Labs  Lab 08/05/21 1816 08/06/21 0139 08/07/21 0341 08/08/21 0224 08/08/21 1157 08/09/21 0325  WBC 25.2* 22.5* 11.3* 11.1*  --  9.9  NEUTROABS 21.3*  --  8.0* 6.8  --  5.1  HGB 11.7* 10.7* 10.1* 8.7* 9.0* 8.9*  HCT 36.4 34.4* 31.2* 27.1* 27.8* 27.4*  MCV 104.0* 108.2* 102.6* 101.5*  --  102.2*  PLT 181 148* 167 145*  --  179    CBG: Recent Labs  Lab 08/08/21 0742 08/08/21 1137 08/08/21 1653 08/08/21 2120 08/09/21 0749  GLUCAP 145* 119* 109* 113*  86     Medications:  piperacillin-tazobactam (ZOSYN)  IV 2.25 g (08/09/21 1202)    (feeding supplement) PROSource Plus  30 mL Oral BID BM   buPROPion  300 mg Oral QHS   calcitRIOL  1.5 mcg Oral Q T,Th,Sa-HD   [START ON 08/10/2021] darbepoetin (ARANESP) injection - DIALYSIS  60 mcg Intravenous Q Mon-HD   dicyclomine  10 mg Oral Q8H   DULoxetine  60 mg Oral QHS   heparin injection (subcutaneous)  5,000 Units Subcutaneous Q8H   pantoprazole  40 mg Oral Daily   pilocarpine  5 mg Oral BID   sevelamer carbonate  1,600 mg Oral TID WC   sodium chloride flush  3 mL Intravenous Q12H    Dialysis Orders: [facility], TTS 4-hour, 97 edw  2K, 2 CA bath, UF profile 2  Heparin 5000  Calcitriol 1.5 mic  Mircera 60 every 2 weeks starting 8/25 Right UA aVF     Assessment/Plan   Abdominal pain/enteritis - infectious versus inflammatory enteritis, ischemia.  WBC improved from 22K > 11K > 9K .  Still having abd issues. Per pmd/  GI/ gen surgery.  ESRD -HD TTS.  Had HD here 8/26, only had 2 HD sessions last week due to staffing issues/ high census.  Labs and vol okay. Next HD Tuesday to get back on schedule. Hypertension/volume  - BP stable, not on antihypertensives.  Does not appear volume overloaded. Up 1-2kg.  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 Continue to follow.  Metabolic bone disease -Corrected Ca elevated, use low Ca bath, hold VDRA.  phos in goal, continue renvela. Anxiety/depression - on meds per admit Type 2 diabetes mellitus- Rx per admit Nutrition - Alb 2.6.  Protein supplements.  Kelly Splinter, MD 08/09/2021, 2:44 PM

## 2021-08-09 NOTE — Progress Notes (Addendum)
PROGRESS NOTE    [name]  JAS:[id number] DOB: [birth year] DOA: 08/05/2021 PCP: Cassandria Anger, MD   No chief complaint on file.  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  She was sent to ED for further evaluation after being found to have leukocytosis in the 20,000s.    Assessment & Plan:   Principal Problem:   Sepsis ([place]) Active Problems:   Diabetes mellitus type 2, controlled ([place])   HYPERCHOLESTEROLEMIA   Anemia   Anxiety disorder   Depression   Essential hypertension   GERD   LOW BACK PAIN   ESRD (end stage renal disease) on dialysis ([name])   Lumbar spondylosis with myelopathy   Enteritis  Enteritis Imaging concerning for infectious vs inflammatory enteritis.  Ischemia on differential. Continue zosyn Full liquids - seems to feel worse since advancing diet - will continue to monitor  General surgery c/s, appreciate recommendations - conservative management GI consult, appreciate recs - if not improving, consider CTE  Hypotension improved  ESRD Dialysis (TTS) per renal  (dialysis this AM) Sevelamer   Anxiety  Depression Wellbutrin, cymbalta Ativan prn  Chronic Pain Cymbalta Oxycodone prn  T2DM Holding oral meds SSI Hypoglycemia on metabolic panel - follow closely (did not receive insulin yesterday)  Anemia Stable, follow   Hx Colitis Noted, GI c/s as noted above  GERD  PPI  DVT prophylaxis: heparin Code Status: full  Family Communication: none at bedside Disposition:   Status is: Observation  The patient remains OBS appropriate and will d/c before 2 midnights.  Dispo: The patient is from: Home              Anticipated d/c is to: Home              Patient currently is not medically stable to d/c.   Difficult to place patient No        Consultants:  GI General surgery Renal   Procedures:  none  Antimicrobials:  Anti-infectives (From admission, onward)    Start     Dose/Rate Route Frequency Ordered Stop   08/05/21 2200  piperacillin-tazobactam (ZOSYN) IVPB 2.25 g  Status:  Discontinued        2.25 g 100 mL/hr over 30 Minutes Intravenous Every 8 hours 08/05/21 2004 08/05/21 2005   08/05/21 2015  piperacillin-tazobactam (ZOSYN) IVPB 2.25 g        2.25 g 100 mL/hr over 30 Minutes Intravenous Every 8 hours 08/05/21 2005            Subjective: Felt worse after full liquids yesterday  Objective: Vitals:   08/08/21 2014 08/09/21 0000 08/09/21 0435 08/09/21 0751  BP: (!) 105/38  (!) 133/34 (!) 112/39  Pulse:    67  Resp:    12  Temp: 98 F (36.7 C) 98.3 F (36.8 C) 97.7 F (36.5 C) 98.3 F (36.8 C)  TempSrc: Oral Oral Oral Oral  SpO2:    99%  Weight:      Height:        Intake/Output Summary (Last 24 hours) at 08/09/2021 1022 Last data filed at 08/09/2021 0900 Gross per 24 hour  Intake 324 ml  Output --  Net 324 ml   Filed Weights   08/05/21 2020 08/07/21 0743 08/07/21 1110  Weight: 98.4 kg 99.2 kg 99.2 kg  Examination:  General: No acute distress. Cardiovascular: RRR Lungs: unlabored Abdomen: mildly diffusely tender Neurological: Alert and oriented 3. Moves all extremities 4 with equal strength. Cranial nerves II through XII grossly intact. Skin: Warm and dry. No rashes or lesions. Extremities: No clubbing or cyanosis. No edema.   Data Reviewed: I have personally reviewed following labs and imaging studies  CBC: Recent Labs  Lab 08/05/21 1557 08/05/21 1816 08/06/21 0139 08/07/21 0341 08/08/21 0224 08/08/21 1157 08/09/21 0325  WBC 26.8 Repeated and verified X2.* 25.2* 22.5* 11.3* 11.1*  --  9.9  NEUTROABS 22.6* 21.3*  --  8.0* 6.8  --  5.1  HGB 11.0* 11.7* 10.7* 10.1* 8.7* 9.0* 8.9*  HCT 33.6* 36.4 34.4* 31.2* 27.1* 27.8* 27.4*  MCV 99.8 104.0* 108.2* 102.6* 101.5*  --   102.2*  PLT 198.0 181 148* 167 145*  --  353    Basic Metabolic Panel: Recent Labs  Lab 08/05/21 1816 08/06/21 0139 08/07/21 0341 08/08/21 0224 08/09/21 0325  NA 131* 130* 131* 132* 134*  K 4.3 5.0 5.5* 3.4* 3.5  CL 89* 91* 91* 94* 96*  CO2 25 26 27 28 26   GLUCOSE 142* 100* 112* 99 62*  BUN 42* 46* 55* 22 35*  CREATININE 4.76* 4.92* 5.73* 3.34* 4.33*  CALCIUM 9.9 9.3 9.5 8.5* 9.1  MG  --   --  2.6* 2.1 2.3  PHOS  --   --  4.9* 3.6 4.2    GFR: Estimated Creatinine Clearance: 13.5 mL/min (Nyajah Hyson) (by C-G formula based on SCr of 4.33 mg/dL (H)).  Liver Function Tests: Recent Labs  Lab 08/05/21 1816 08/06/21 0139 08/07/21 0341 08/08/21 0224 08/09/21 0325  AST 27 22 16  14* 16  ALT 25 20 18 14 15   ALKPHOS 93 82 77 90 121  BILITOT 1.0 0.8 1.2 0.9 0.7  PROT 7.3 6.4* 6.2* 5.5* 5.7*  ALBUMIN 3.6 3.1* 2.8* 2.3* 2.4*    CBG: Recent Labs  Lab 08/08/21 0742 08/08/21 1137 08/08/21 1653 08/08/21 2120 08/09/21 0749  GLUCAP 145* 119* 109* 113* 86     Recent Results (from the past 240 hour(s))  SARS CORONAVIRUS 2 (TAT 6-24 HRS) Nasopharyngeal Nasopharyngeal Swab     Status: None   Collection Time: 08/05/21  9:17 PM   Specimen: Nasopharyngeal Swab  Result Value Ref Range Status   SARS Coronavirus 2 NEGATIVE NEGATIVE Final    Comment: (NOTE) SARS-CoV-2 target nucleic acids are NOT DETECTED.  The SARS-CoV-2 RNA is generally detectable in upper and lower respiratory specimens during the acute phase of infection. Negative results do not preclude SARS-CoV-2 infection, do not rule out co-infections with other pathogens, and should not be used as the sole basis for treatment or other patient management decisions. Negative results must be combined with clinical observations, patient history, and epidemiological information. The expected result is Negative.  Fact Sheet for Patients: SugarRoll.be  Fact Sheet for Healthcare  Providers: https://www.woods-mathews.com/  This test is not yet approved or cleared by the [place] FDA and  has been authorized for detection and/or diagnosis of SARS-CoV-2 by FDA under an Emergency Use Authorization (EUA). This EUA will remain  in effect (meaning this test can be used) for the duration of the COVID-19 declaration under Se ction 564(b)(1) of the Act, 21 U.S.C. section 360bbb-3(b)(1), unless the authorization is terminated or revoked sooner.  Performed at [facility], [place] [address], [place], [place] [address]          Radiology Studies: No results  found.      Scheduled Meds:  (feeding supplement) PROSource Plus  30 mL Oral BID BM   buPROPion  300 mg Oral QHS   calcitRIOL  1.5 mcg Oral Q T,Th,Sa-HD   [START ON 08/10/2021] darbepoetin (ARANESP) injection - DIALYSIS  60 mcg Intravenous Q Mon-HD   dicyclomine  10 mg Oral Q8H   DULoxetine  60 mg Oral QHS   heparin injection (subcutaneous)  5,000 Units Subcutaneous Q8H   insulin aspart  0-6 Units Subcutaneous TID WC   pantoprazole  40 mg Oral Daily   pilocarpine  5 mg Oral BID   sevelamer carbonate  1,600 mg Oral TID WC   sodium chloride flush  3 mL Intravenous Q12H   Continuous Infusions:  piperacillin-tazobactam (ZOSYN)  IV 2.25 g (08/09/21 0637)     LOS: 3 days    Time spent: over 30 min    Fayrene Helper, MD [facility]   To contact the attending provider between 7A-7P or the covering provider during after hours 7P-7A, please log into the web site [email or web address] and access using universal [place] password for that web site. If you do not have the password, please call the hospital operator.  08/09/2021, 10:22 AM

## 2021-08-09 NOTE — Progress Notes (Signed)
PROGRESS NOTE FOR [facility]  Subjective: PO intake caused her to have pain yesterday.  Objective: Vital signs in last 24 hours: Temp:  [97.7 F (36.5 C)-98.3 F (36.8 C)] 98.3 F (36.8 C) (08/28 0751) Pulse Rate:  [67-78] 67 (08/28 0751) Resp:  [12-19] 12 (08/28 0751) BP: (105-133)/(34-68) 112/39 (08/28 0751) SpO2:  [94 %-100 %] 99 % (08/28 0751) Last BM Date: 08/08/21  Intake/Output from previous day: No intake/output data recorded. Intake/Output this shift: No intake/output data recorded.  General appearance: alert and no distress GI: mild mid abdominal tenderness  Lab Results: Recent Labs    08/07/21 0341 08/08/21 0224 08/08/21 1157 08/09/21 0325  WBC 11.3* 11.1*  --  9.9  HGB 10.1* 8.7* 9.0* 8.9*  HCT 31.2* 27.1* 27.8* 27.4*  PLT 167 145*  --  179   BMET Recent Labs    08/07/21 0341 08/08/21 0224 08/09/21 0325  NA 131* 132* 134*  K 5.5* 3.4* 3.5  CL 91* 94* 96*  CO2 27 28 26   GLUCOSE 112* 99 62*  BUN 55* 22 35*  CREATININE 5.73* 3.34* 4.33*  CALCIUM 9.5 8.5* 9.1   LFT Recent Labs    08/09/21 0325  PROT 5.7*  ALBUMIN 2.4*  AST 16  ALT 15  ALKPHOS 121  BILITOT 0.7   PT/INR No results for input(s): LABPROT, INR in the last 72 hours. Hepatitis Panel No results for input(s): HEPBSAG, HCVAB, HEPAIGM, HEPBIGM in the last 72 hours. C-Diff No results for input(s): CDIFFTOX in the last 72 hours. Fecal Lactopherrin No results for input(s): FECLLACTOFRN in the last 72 hours.  Studies/Results: No results found.  Medications: Scheduled:  (feeding supplement) PROSource Plus  30 mL Oral BID BM   buPROPion  300 mg Oral QHS   calcitRIOL  1.5 mcg Oral Q T,Th,Sa-HD   [START ON 08/10/2021] darbepoetin (ARANESP) injection - DIALYSIS  60 mcg Intravenous Q Mon-HD   dicyclomine  10 mg Oral Q8H   DULoxetine  60 mg Oral QHS   heparin injection (subcutaneous)  5,000 Units Subcutaneous Q8H   insulin aspart  0-6 Units Subcutaneous TID WC   pantoprazole  40 mg  Oral Daily   pilocarpine  5 mg Oral BID   sevelamer carbonate  1,600 mg Oral TID WC   sodium chloride flush  3 mL Intravenous Q12H   Continuous:  piperacillin-tazobactam (ZOSYN)  IV 2.25 g (08/09/21 8657)    Assessment/Plan: 1) Enteritis. 2) Leukocytosis - resolved. 3) Abdominal pain.   Objectively, the physical examination is unchanged, but she does not feel as well.  PO intake worsened her pain, especially with drinking the protein drink.  Plan: 1) Continue with antibiotics. 2) PO intake as tolerated. 3) [facility] to assume care in the AM.  LOS: 3 days   Alyah Boehning D 08/09/2021, 9:07 AM

## 2021-08-10 ENCOUNTER — Inpatient Hospital Stay (HOSPITAL_COMMUNITY): Payer: Medicare Other

## 2021-08-10 DIAGNOSIS — K529 Noninfective gastroenteritis and colitis, unspecified: Secondary | ICD-10-CM | POA: Diagnosis not present

## 2021-08-10 LAB — COMPREHENSIVE METABOLIC PANEL
ALT: 18 U/L (ref 0–44)
AST: 20 U/L (ref 15–41)
Albumin: 2.3 g/dL — ABNORMAL LOW (ref 3.5–5.0)
Alkaline Phosphatase: 110 U/L (ref 38–126)
Anion gap: 16 — ABNORMAL HIGH (ref 5–15)
BUN: 46 mg/dL — ABNORMAL HIGH (ref 8–23)
CO2: 21 mmol/L — ABNORMAL LOW (ref 22–32)
Calcium: 9 mg/dL (ref 8.9–10.3)
Chloride: 96 mmol/L — ABNORMAL LOW (ref 98–111)
Creatinine, Ser: 4.91 mg/dL — ABNORMAL HIGH (ref 0.44–1.00)
GFR, Estimated: 9 mL/min — ABNORMAL LOW (ref >60)
Glucose, Bld: 79 mg/dL (ref 70–99)
Potassium: 3.6 mmol/L (ref 3.5–5.1)
Sodium: 133 mmol/L — ABNORMAL LOW (ref 135–145)
Total Bilirubin: 0.8 mg/dL (ref 0.3–1.2)
Total Protein: 5.5 g/dL — ABNORMAL LOW (ref 6.5–8.1)

## 2021-08-10 LAB — CBC WITH DIFFERENTIAL/PLATELET
Abs Immature Granulocytes: 0.13 10*3/uL — ABNORMAL HIGH (ref 0.00–0.07)
Basophils Absolute: 0.1 10*3/uL (ref 0.0–0.1)
Basophils Relative: 1 %
Eosinophils Absolute: 0.4 10*3/uL (ref 0.0–0.5)
Eosinophils Relative: 5 %
HCT: 28.1 % — ABNORMAL LOW (ref 36.0–46.0)
Hemoglobin: 9 g/dL — ABNORMAL LOW (ref 12.0–15.0)
Immature Granulocytes: 2 %
Lymphocytes Relative: 31 %
Lymphs Abs: 2.6 10*3/uL (ref 0.7–4.0)
MCH: 33 pg (ref 26.0–34.0)
MCHC: 32 g/dL (ref 30.0–36.0)
MCV: 102.9 fL — ABNORMAL HIGH (ref 80.0–100.0)
Monocytes Absolute: 1 10*3/uL (ref 0.1–1.0)
Monocytes Relative: 11 %
Neutro Abs: 4.3 10*3/uL (ref 1.7–7.7)
Neutrophils Relative %: 50 %
Platelets: 175 10*3/uL (ref 150–400)
RBC: 2.73 MIL/uL — ABNORMAL LOW (ref 3.87–5.11)
RDW: 13.2 % (ref 11.5–15.5)
WBC: 8.5 10*3/uL (ref 4.0–10.5)
nRBC: 0 % (ref 0.0–0.2)

## 2021-08-10 LAB — MAGNESIUM: Magnesium: 2.3 mg/dL (ref 1.7–2.4)

## 2021-08-10 LAB — PHOSPHORUS: Phosphorus: 4.2 mg/dL (ref 2.5–4.6)

## 2021-08-10 LAB — GLUCOSE, CAPILLARY
Glucose-Capillary: 88 mg/dL (ref 70–99)
Glucose-Capillary: 155 mg/dL — ABNORMAL HIGH (ref 70–99)

## 2021-08-10 MED ORDER — CHLORHEXIDINE GLUCONATE CLOTH 2 % EX PADS
6.0000 | MEDICATED_PAD | Freq: Every day | CUTANEOUS | Status: DC
  Administered 2021-08-11: 6 via TOPICAL

## 2021-08-10 MED ORDER — SENNOSIDES-DOCUSATE SODIUM 8.6-50 MG PO TABS: 2.0000 | ORAL_TABLET | Freq: Every day | ORAL | Status: DC

## 2021-08-10 MED ORDER — POLYETHYLENE GLYCOL 3350 17 G PO PACK
17.0000 g | PACK | Freq: Two times a day (BID) | ORAL | Status: DC
  Administered 2021-08-10: 17 g via ORAL
  Filled 2021-08-10 (×2): qty 1

## 2021-08-10 NOTE — Progress Notes (Signed)
PROGRESS NOTE    [name]  YTK:[id number] DOB: February 15, 1951 DOA: 08/05/2021 PCP: Cassandria Anger, MD   No chief complaint on file.  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  She was sent to ED for further evaluation after being found to have leukocytosis in the 20,000s.    Assessment & Plan:   Principal Problem:   Sepsis ([place]) Active Problems:   Diabetes mellitus type 2, controlled ([place])   HYPERCHOLESTEROLEMIA   Anemia   Anxiety disorder   Depression   Essential hypertension   GERD   LOW BACK PAIN   ESRD (end stage renal disease) on dialysis ([name])   Lumbar spondylosis with myelopathy   Enteritis  Enteritis Imaging concerning for infectious vs inflammatory enteritis.  Ischemia on differential. Continue zosyn Full liquids - seems to feel worse since advancing diet - still has epigastric discomfort - will continue to monitor  General surgery c/s, appreciate recommendations - conservative management GI consult, appreciate recs - if not improving, consider CTE  Constipation Bowel regimen   Hypotension improved  ESRD Dialysis (TTS) per renal  (dialysis this AM) [place]   Anxiety  Depression Wellbutrin, cymbalta Ativan prn  Chronic Pain Cymbalta Oxycodone prn  T2DM Holding oral meds SSI Hypoglycemia on metabolic panel - follow closely (did not receive insulin yesterday)  Anemia Stable, follow   Hx Colitis Noted, GI c/s as noted above  GERD  PPI  DVT prophylaxis: heparin Code Status: full  Family Communication: none at bedside Disposition:   Status is: Observation  The patient remains OBS appropriate and will d/c before 2 midnights.  Dispo: The patient is from: Home              Anticipated d/c is to: Home              Patient currently is not  medically stable to d/c.   Difficult to place patient No       Consultants:  GI General surgery Renal   Procedures:  none  Antimicrobials:  Anti-infectives (From admission, onward)    Start     Dose/Rate Route Frequency Ordered Stop   08/05/21 2200  piperacillin-tazobactam (ZOSYN) IVPB 2.25 g  Status:  Discontinued        2.25 g 100 mL/hr over 30 Minutes Intravenous Every 8 hours 08/05/21 2004 08/05/21 2005   08/05/21 2015  piperacillin-tazobactam (ZOSYN) IVPB 2.25 g        2.25 g 100 mL/hr over 30 Minutes Intravenous Every 8 hours 08/05/21 2005            Subjective: Epigastric discomfort since full liquids started - persistent   Objective: Vitals:   08/09/21 2042 08/10/21 0035 08/10/21 0500 08/10/21 0846  BP: (!) 109/57 (!) 135/46 133/63 (!) 115/52  Pulse: 72 77  76  Resp: 18   16  Temp: 97.8 F (36.6 C) 98.8 F (37.1 C) 98.8 F (37.1 C)   TempSrc: Oral Oral Oral   SpO2: 100% 94% 96% 96%  Weight:      Height:        Intake/Output Summary (Last 24 hours) at 08/10/2021 1005 Last data filed at 08/10/2021 0400 Gross per 24 hour  Intake 350 ml  Output --  Net 350 ml   Filed Weights   08/05/21 2020 08/07/21 0743 08/07/21  1110  Weight: 98.4 kg 99.2 kg 99.2 kg    Examination:  General: No acute distress. Cardiovascular: RRR Lungs: unlabored Abdomen: epigastric TTP  Neurological: Alert and oriented 3. Moves all extremities 4 . Cranial nerves II through XII grossly intact. Skin: Warm and dry. No rashes or lesions. Extremities: No clubbing or cyanosis. No edema   Data Reviewed: I have personally reviewed following labs and imaging studies  CBC: Recent Labs  Lab 08/05/21 1816 08/06/21 0139 08/07/21 0341 08/08/21 0224 08/08/21 1157 08/09/21 0325 08/10/21 0300  WBC 25.2* 22.5* 11.3* 11.1*  --  9.9 8.5  NEUTROABS 21.3*  --  8.0* 6.8  --  5.1 4.3  HGB 11.7* 10.7* 10.1* 8.7* 9.0* 8.9* 9.0*  HCT 36.4 34.4* 31.2* 27.1* 27.8* 27.4* 28.1*  MCV  104.0* 108.2* 102.6* 101.5*  --  102.2* 102.9*  PLT 181 148* 167 145*  --  179 299    Basic Metabolic Panel: Recent Labs  Lab 08/06/21 0139 08/07/21 0341 08/08/21 0224 08/09/21 0325 08/10/21 0300  NA 130* 131* 132* 134* 133*  K 5.0 5.5* 3.4* 3.5 3.6  CL 91* 91* 94* 96* 96*  CO2 26 27 28 26  21*  GLUCOSE 100* 112* 99 62* 79  BUN 46* 55* 22 35* 46*  CREATININE 4.92* 5.73* 3.34* 4.33* 4.91*  CALCIUM 9.3 9.5 8.5* 9.1 9.0  MG  --  2.6* 2.1 2.3 2.3  PHOS  --  4.9* 3.6 4.2 4.2    GFR: Estimated Creatinine Clearance: 11.9 mL/min (Kasson Lamere) (by C-G formula based on SCr of 4.91 mg/dL (H)).  Liver Function Tests: Recent Labs  Lab 08/06/21 0139 08/07/21 0341 08/08/21 0224 08/09/21 0325 08/10/21 0300  AST 22 16 14* 16 20  ALT 20 18 14 15 18   ALKPHOS 82 77 90 121 110  BILITOT 0.8 1.2 0.9 0.7 0.8  PROT 6.4* 6.2* 5.5* 5.7* 5.5*  ALBUMIN 3.1* 2.8* 2.3* 2.4* 2.3*    CBG: Recent Labs  Lab 08/08/21 1137 08/08/21 1653 08/08/21 2120 08/09/21 0749 08/10/21 0742  GLUCAP 119* 109* 113* 86 88     Recent Results (from the past 240 hour(s))  SARS CORONAVIRUS 2 (TAT 6-24 HRS) Nasopharyngeal Nasopharyngeal Swab     Status: None   Collection Time: 08/05/21  9:17 PM   Specimen: Nasopharyngeal Swab  Result Value Ref Range Status   SARS Coronavirus 2 NEGATIVE NEGATIVE Final    Comment: (NOTE) SARS-CoV-2 target nucleic acids are NOT DETECTED.  The SARS-CoV-2 RNA is generally detectable in upper and lower respiratory specimens during the acute phase of infection. Negative results do not preclude SARS-CoV-2 infection, do not rule out co-infections with other pathogens, and should not be used as the sole basis for treatment or other patient management decisions. Negative results must be combined with clinical observations, patient history, and epidemiological information. The expected result is Negative.  Fact Sheet for Patients: SugarRoll.be  Fact Sheet for  Healthcare Providers: https://www.woods-mathews.com/  This test is not yet approved or cleared by the [place] FDA and  has been authorized for detection and/or diagnosis of SARS-CoV-2 by FDA under an Emergency Use Authorization (EUA). This EUA will remain  in effect (meaning this test can be used) for the duration of the COVID-19 declaration under Se ction 564(b)(1) of the Act, 21 U.S.C. section 360bbb-3(b)(1), unless the authorization is terminated or revoked sooner.  Performed at [facility], [place] [address] [place], [place] [address]          Radiology Studies:  No results found.      Scheduled Meds:  (feeding supplement) PROSource Plus  30 mL Oral BID BM   buPROPion  300 mg Oral QHS   Chlorhexidine Gluconate Cloth  6 each Topical Q0600   darbepoetin (ARANESP) injection - DIALYSIS  60 mcg Intravenous Q Mon-HD   dicyclomine  10 mg Oral Q8H   DULoxetine  60 mg Oral QHS   heparin injection (subcutaneous)  5,000 Units Subcutaneous Q8H   pantoprazole  40 mg Oral Daily   pilocarpine  5 mg Oral BID   polyethylene glycol  17 g Oral BID   senna-docusate  2 tablet Oral QHS   sevelamer carbonate  1,600 mg Oral TID WC   sodium chloride flush  3 mL Intravenous Q12H   Continuous Infusions:  piperacillin-tazobactam (ZOSYN)  IV 2.25 g (08/10/21 0544)     LOS: 4 days    Time spent: over 30 min    Fayrene Helper, MD [facility]   To contact the attending provider between 7A-7P or the covering provider during after hours 7P-7A, please log into the web site [email or web address] and access using universal [place] password for that web site. If you do not have the password, please call the hospital operator.  08/10/2021, 10:05 AM

## 2021-08-10 NOTE — Progress Notes (Signed)
[name]   Subjective:   Interval History: Tolerating solid food.  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  It appears that imaging was consistent with infectious versus inflammatory enteritis.  Ischemic colitis is in the differential.  General surgery and gastroenterology consulted.  Blood pressure 133/63 pulse 81 temperature 98.8 O2 sats 91% room air  Sodium 133 potassium 3.6 chloride 96 CO2 21 BUN 46 creatinine 4.91 glucose 79 calcium 9 phosphorus 4.2 magnesium 2.3 hemoglobin 9.7  Objective:  Vital signs in last 24 hours:  Temp:  [97.8 F (36.6 C)-98.8 F (37.1 C)] 98.8 F (37.1 C) (08/29 0500) Pulse Rate:  [60-77] 77 (08/29 0035) Resp:  [12-18] 18 (08/28 2042) BP: (109-135)/(39-63) 133/63 (08/29 0500) SpO2:  [82 %-100 %] 96 % (08/29 0500)  Weight change:  Filed Weights   08/05/21 2020 08/07/21 0743 08/07/21 1110  Weight: 98.4 kg 99.2 kg 99.2 kg    Intake/Output: I/O last 3 completed shifts: In: 324 [P.O.:320; I.V.:4] Out: -    Intake/Output this shift:  Total I/O In: 350 [IV Piggyback:350] Out: -   General:[name] in NAD Heart:RRR, no mrg Lungs:CTAB, nml WOB on RA Abdomen:soft, NTND Extremities:no LE edema Dialysis Access: RU AVF +b/t    Basic Metabolic Panel: Recent Labs  Lab 08/06/21 0139 08/07/21 0341 08/08/21 0224 08/09/21 0325 08/10/21 0300  NA 130* 131* 132* 134* 133*  K 5.0 5.5* 3.4* 3.5 3.6  CL 91* 91* 94* 96* 96*  CO2 26 27 28 26  21*  GLUCOSE 100* 112* 99 62* 79  BUN 46* 55* 22 35* 46*  CREATININE 4.92* 5.73* 3.34* 4.33* 4.91*  CALCIUM 9.3 9.5 8.5* 9.1 9.0  MG  --  2.6* 2.1 2.3 2.3  PHOS  --  4.9* 3.6 4.2 4.2    Liver Function Tests: Recent Labs  Lab 08/06/21 0139 08/07/21 0341 08/08/21 0224 08/09/21 0325 08/10/21 0300  AST 22 16 14* 16 20  ALT 20 18 14 15 18   ALKPHOS 82 77 90 121 110  BILITOT  0.8 1.2 0.9 0.7 0.8  PROT 6.4* 6.2* 5.5* 5.7* 5.5*  ALBUMIN 3.1* 2.8* 2.3* 2.4* 2.3*   Recent Labs  Lab 08/05/21 1557 08/05/21 1816  LIPASE 5.0* 20  AMYLASE 20*  --    No results for input(s): AMMONIA in the last 168 hours.  CBC: Recent Labs  Lab 08/05/21 1816 08/06/21 0139 08/07/21 0341 08/08/21 0224 08/08/21 1157 08/09/21 0325 08/10/21 0300  WBC 25.2* 22.5* 11.3* 11.1*  --  9.9 8.5  NEUTROABS 21.3*  --  8.0* 6.8  --  5.1 4.3  HGB 11.7* 10.7* 10.1* 8.7* 9.0* 8.9* 9.0*  HCT 36.4 34.4* 31.2* 27.1* 27.8* 27.4* 28.1*  MCV 104.0* 108.2* 102.6* 101.5*  --  102.2* 102.9*  PLT 181 148* 167 145*  --  179 175    Cardiac Enzymes: No results for input(s): CKTOTAL, CKMB, CKMBINDEX, TROPONINI in the last 168 hours.  BNP: Invalid input(s): POCBNP  CBG: Recent Labs  Lab 08/08/21 0742 08/08/21 1137 08/08/21 1653 08/08/21 2120 08/09/21 0749  GLUCAP 145* 119* 109* 113* 19    Microbiology: Results for orders placed or performed during the hospital encounter of 08/05/21  SARS CORONAVIRUS 2 (TAT 6-24 HRS) Nasopharyngeal Nasopharyngeal Swab     Status: None   Collection Time: 08/05/21  9:17 PM   Specimen: Nasopharyngeal Swab  Result Value Ref Range Status   SARS Coronavirus 2 NEGATIVE NEGATIVE Final  Comment: ([name]) SARS-CoV-2 target nucleic acids are NOT DETECTED.  The SARS-CoV-2 RNA is generally detectable in upper and lower respiratory specimens during the acute phase of infection. Negative results do not preclude SARS-CoV-2 infection, do not rule out co-infections with other pathogens, and should not be used as the sole basis for treatment or other patient management decisions. Negative results must be combined with clinical observations, patient history, and epidemiological information. The expected result is Negative.  Fact Sheet for Patients: SugarRoll.be  Fact Sheet for Healthcare  Providers: https://www.woods-mathews.com/  This test is not yet approved or cleared by the [place] FDA and  has been authorized for detection and/or diagnosis of SARS-CoV-2 by FDA under an Emergency Use Authorization (EUA). This EUA will remain  in effect (meaning this test can be used) for the duration of the COVID-19 declaration under Se ction 564(b)(1) of the Act, 21 U.S.C. section 360bbb-3(b)(1), unless the authorization is terminated or revoked sooner.  Performed at [facility], [place] [address] [place], [place] [address]     Coagulation Studies: No results for input(s): LABPROT, INR in the last 72 hours.  Urinalysis: No results for input(s): COLORURINE, LABSPEC, PHURINE, GLUCOSEU, HGBUR, BILIRUBINUR, KETONESUR, PROTEINUR, UROBILINOGEN, NITRITE, LEUKOCYTESUR in the last 72 hours.  Invalid input(s): APPERANCEUR    Imaging: No results found.   Medications:    piperacillin-tazobactam (ZOSYN)  IV 2.25 g (08/10/21 0544)    (feeding supplement) PROSource Plus  30 mL Oral BID BM   buPROPion  300 mg Oral QHS   calcitRIOL  1.5 mcg Oral Q T,Th,Sa-HD   darbepoetin (ARANESP) injection - DIALYSIS  60 mcg Intravenous Q Mon-HD   dicyclomine  10 mg Oral Q8H   DULoxetine  60 mg Oral QHS   heparin injection (subcutaneous)  5,000 Units Subcutaneous Q8H   pantoprazole  40 mg Oral Daily   pilocarpine  5 mg Oral BID   sevelamer carbonate  1,600 mg Oral TID WC   sodium chloride flush  3 mL Intravenous Q12H   acetaminophen **OR** acetaminophen, HYDROmorphone (DILAUDID) injection, lidocaine, LORazepam, polyethylene glycol, polyvinyl alcohol  Assessment/ Plan:  Abdominal pain/enteritis - infectious versus inflammatory enteritis, ischemia.  WBC improved from 22K > 11K > 9K .   Per pmd/ GI/ gen surgery.  ESRD -HD TTS.  Had HD here 8/26, only had 2 HD sessions last week due to staffing issues/ high census.  Labs and vol okay. Next HD 08/11/2021 to get back on  schedule. Hypertension/volume  - BP stable, not on antihypertensives.  Does not appear volume overloaded. Up 1-2kg.  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 Continue to follow.  Metabolic bone disease -we will continue to follow we will place patient back on regular calcium bath.  Continue to hold vitamin D products Anxiety/depression - on meds per admit Type 2 diabetes mellitus- Rx per admit Nutrition - Alb 2.6.  Protein supplements.    LOS: [place] @TODAY @6 :52 AM

## 2021-08-10 NOTE — Progress Notes (Signed)
[facility] Progress Note    Since last GI note: Abd pains have changed. The pain which brought her to ER was more lower abd, diffuse. That is gone. Now she is having nearly constant pain in epigastric region and RUQ.  Worse with eating.  Objective: Vital signs in last 24 hours: Temp:  [97.8 F (36.6 C)-98.8 F (37.1 C)] 98.2 F (36.8 C) (08/29 0846) Pulse Rate:  [60-79] 79 (08/29 1131) Resp:  [12-18] 16 (08/29 1131) BP: (109-135)/(40-76) 127/76 (08/29 1131) SpO2:  [82 %-100 %] 94 % (08/29 1131) Last BM Date: 08/07/21 General: alert and oriented times 3, obese Heart: regular rate and rythm Abdomen: soft, mildly tender mid epig, RUQ,  non-distended, normal bowel sounds   Lab Results: Recent Labs    08/08/21 0224 08/08/21 1157 08/09/21 0325 08/10/21 0300  WBC 11.1*  --  9.9 8.5  HGB 8.7* 9.0* 8.9* 9.0*  PLT 145*  --  179 175  MCV 101.5*  --  102.2* 102.9*   Recent Labs    08/08/21 0224 08/09/21 0325 08/10/21 0300  NA 132* 134* 133*  K 3.4* 3.5 3.6  CL 94* 96* 96*  CO2 28 26 21*  GLUCOSE 99 62* 79  BUN 22 35* 46*  CREATININE 3.34* 4.33* 4.91*  CALCIUM 8.5* 9.1 9.0   Recent Labs    08/08/21 0224 08/09/21 0325 08/10/21 0300  PROT 5.5* 5.7* 5.5*  ALBUMIN 2.3* 2.4* 2.3*  AST 14* 16 20  ALT 14 15 18   ALKPHOS 90 121 110  BILITOT 0.9 0.7 0.8   No results for input(s): INR in the last 72 hours.   Studies/Results: No results found.   Medications: Scheduled Meds:  (feeding supplement) PROSource Plus  30 mL Oral BID BM   buPROPion  300 mg Oral QHS   Chlorhexidine Gluconate Cloth  6 each Topical Q0600   darbepoetin (ARANESP) injection - DIALYSIS  60 mcg Intravenous Q Mon-HD   dicyclomine  10 mg Oral Q8H   DULoxetine  60 mg Oral QHS   heparin injection (subcutaneous)  5,000 Units Subcutaneous Q8H   pantoprazole  40 mg Oral Daily   pilocarpine  5 mg Oral BID   polyethylene glycol  17 g Oral BID   senna-docusate  2 tablet Oral QHS    sevelamer carbonate  1,600 mg Oral TID WC   sodium chloride flush  3 mL Intravenous Q12H   Continuous Infusions:  piperacillin-tazobactam (ZOSYN)  IV 2.25 g (08/10/21 0544)   PRN Meds:.acetaminophen **OR** acetaminophen, HYDROmorphone (DILAUDID) injection, lidocaine, LORazepam, polyvinyl alcohol    Assessment/Plan: 70 y.o. female evolving abd pains.  Unclear if her enteritis related pains are improved and this is a new issue vs. Just evolution of her acute enteritis related GI symptoms. WBC normal.  I am actually more suspicious of a new process and am ordering an abd [place] to check for GB pathology, cholecystitis.  If this is not helpful and she continues to feel poorly then CTE as had been previous plans.   Milus Banister, MD  08/10/2021, 11:59 AM [facility] Pager [phone]

## 2021-08-11 ENCOUNTER — Other Ambulatory Visit (HOSPITAL_COMMUNITY): Payer: Self-pay

## 2021-08-11 LAB — COMPREHENSIVE METABOLIC PANEL
ALT: 27 U/L (ref 0–44)
AST: 33 U/L (ref 15–41)
Albumin: 2.3 g/dL — ABNORMAL LOW (ref 3.5–5.0)
Alkaline Phosphatase: 118 U/L (ref 38–126)
Anion gap: 13 (ref 5–15)
BUN: 53 mg/dL — ABNORMAL HIGH (ref 8–23)
CO2: 24 mmol/L (ref 22–32)
Calcium: 9 mg/dL (ref 8.9–10.3)
Chloride: 97 mmol/L — ABNORMAL LOW (ref 98–111)
Creatinine, Ser: 5.49 mg/dL — ABNORMAL HIGH (ref 0.44–1.00)
GFR, Estimated: 8 mL/min — ABNORMAL LOW (ref >60)
Glucose, Bld: 121 mg/dL — ABNORMAL HIGH (ref 70–99)
Potassium: 3.5 mmol/L (ref 3.5–5.1)
Sodium: 134 mmol/L — ABNORMAL LOW (ref 135–145)
Total Bilirubin: 0.8 mg/dL (ref 0.3–1.2)
Total Protein: 5.5 g/dL — ABNORMAL LOW (ref 6.5–8.1)

## 2021-08-11 LAB — CBC WITH DIFFERENTIAL/PLATELET
Abs Immature Granulocytes: 0.25 10*3/uL — ABNORMAL HIGH (ref 0.00–0.07)
Basophils Absolute: 0.1 10*3/uL (ref 0.0–0.1)
Basophils Relative: 1 %
Eosinophils Absolute: 0.5 10*3/uL (ref 0.0–0.5)
Eosinophils Relative: 5 %
HCT: 26.7 % — ABNORMAL LOW (ref 36.0–46.0)
Hemoglobin: 8.7 g/dL — ABNORMAL LOW (ref 12.0–15.0)
Immature Granulocytes: 3 %
Lymphocytes Relative: 32 %
Lymphs Abs: 3.2 10*3/uL (ref 0.7–4.0)
MCH: 33.3 pg (ref 26.0–34.0)
MCHC: 32.6 g/dL (ref 30.0–36.0)
MCV: 102.3 fL — ABNORMAL HIGH (ref 80.0–100.0)
Monocytes Absolute: 1.1 10*3/uL — ABNORMAL HIGH (ref 0.1–1.0)
Monocytes Relative: 11 %
Neutro Abs: 4.9 10*3/uL (ref 1.7–7.7)
Neutrophils Relative %: 48 %
Platelets: 191 10*3/uL (ref 150–400)
RBC: 2.61 MIL/uL — ABNORMAL LOW (ref 3.87–5.11)
RDW: 13.2 % (ref 11.5–15.5)
WBC: 9.9 10*3/uL (ref 4.0–10.5)
nRBC: 0 % (ref 0.0–0.2)

## 2021-08-11 LAB — GLUCOSE, CAPILLARY
Glucose-Capillary: 89 mg/dL (ref 70–99)
Glucose-Capillary: 156 mg/dL — ABNORMAL HIGH (ref 70–99)
Glucose-Capillary: 85 mg/dL (ref 70–99)

## 2021-08-11 LAB — PHOSPHORUS: Phosphorus: 4.4 mg/dL (ref 2.5–4.6)

## 2021-08-11 LAB — MAGNESIUM: Magnesium: 2.4 mg/dL (ref 1.7–2.4)

## 2021-08-11 MED ORDER — AMOXICILLIN-POT CLAVULANATE 500-125 MG PO TABS
1.0000 | ORAL_TABLET | Freq: Every day | ORAL | 0 refills | Status: DC
  Filled 2021-08-11: qty 5, 5d supply, fill #0

## 2021-08-11 MED ORDER — AMOXICILLIN-POT CLAVULANATE 500-125 MG PO TABS: 1.0000 | ORAL_TABLET | Freq: Every day | ORAL | 0 refills | Status: AC

## 2021-08-11 MED ORDER — DARBEPOETIN ALFA 60 MCG/0.3ML IJ SOSY
60.0000 ug | PREFILLED_SYRINGE | INTRAMUSCULAR | Status: DC
  Administered 2021-08-11: 60 ug via INTRAVENOUS
  Filled 2021-08-11: qty 0.3

## 2021-08-11 NOTE — Care Management Important Message (Signed)
Important Message  Patient Details  Name: [name] MRN: [id number] Date of Birth: [birth year]   Medicare Important Message Given:  Yes     Orbie Pyo 08/11/2021, 4:13 PM

## 2021-08-11 NOTE — Progress Notes (Signed)
Patient off floor to dialysis

## 2021-08-11 NOTE — Progress Notes (Signed)
[facility] ROUNDING NOTE   Subjective:   Interval History: Tolerating solid food.  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  It appears that imaging was consistent with infectious versus inflammatory enteritis.  Ischemic colitis is in the differential.  General surgery and gastroenterology consulted.  Repeat ultrasound 08/10/2021 was unremarkable.  Blood pressure 118/52 pulse 66 temperature 98.4.  Dialysis scheduled 08/11/2021  Sodium 134 potassium 3.5 chloride 97 CO2 24 BUN 53 creatinine 5.49 glucose 121 phosphorus 2.4 calcium 9 hemoglobin 8.7  Objective:  Vital signs in last 24 hours:  Temp:  [98.2 F (36.8 C)-98.8 F (37.1 C)] 98.4 F (36.9 C) (08/30 0300) Pulse Rate:  [74-79] 74 (08/30 0300) Resp:  [14-16] 14 (08/29 1953) BP: (100-156)/(46-76) 118/52 (08/30 0300) SpO2:  [94 %-97 %] 97 % (08/30 0300)  Weight change:  Filed Weights   08/05/21 2020 08/07/21 0743 08/07/21 1110  Weight: 98.4 kg 99.2 kg 99.2 kg    Intake/Output: I/O last 3 completed shifts: In: 350 [IV Piggyback:350] Out: -    Intake/Output this shift:  No intake/output data recorded.  General:well appearing female in NAD Heart:RRR, no mrg Lungs:CTAB, nml WOB on RA Abdomen:soft, NTND Extremities:no LE edema Dialysis Access: RU AVF +b/t    Basic Metabolic Panel: Recent Labs  Lab 08/07/21 0341 08/08/21 0224 08/09/21 0325 08/10/21 0300 08/11/21 0222  NA 131* 132* 134* 133* 134*  K 5.5* 3.4* 3.5 3.6 3.5  CL 91* 94* 96* 96* 97*  CO2 27 28 26  21* 24  GLUCOSE 112* 99 62* 79 121*  BUN 55* 22 35* 46* 53*  CREATININE 5.73* 3.34* 4.33* 4.91* 5.49*  CALCIUM 9.5 8.5* 9.1 9.0 9.0  MG 2.6* 2.1 2.3 2.3 2.4  PHOS 4.9* 3.6 4.2 4.2 4.4     Liver Function Tests: Recent Labs  Lab 08/07/21 0341 08/08/21 0224 08/09/21 0325 08/10/21 0300 08/11/21 0222  AST 16 14* 16 20 33  ALT 18 14 15 18 27   ALKPHOS  77 90 121 110 118  BILITOT 1.2 0.9 0.7 0.8 0.8  PROT 6.2* 5.5* 5.7* 5.5* 5.5*  ALBUMIN 2.8* 2.3* 2.4* 2.3* 2.3*    Recent Labs  Lab 08/05/21 1557 08/05/21 1816  LIPASE 5.0* 20  AMYLASE 20*  --     No results for input(s): AMMONIA in the last 168 hours.  CBC: Recent Labs  Lab 08/07/21 0341 08/08/21 0224 08/08/21 1157 08/09/21 0325 08/10/21 0300 08/11/21 0222  WBC 11.3* 11.1*  --  9.9 8.5 9.9  NEUTROABS 8.0* 6.8  --  5.1 4.3 4.9  HGB 10.1* 8.7* 9.0* 8.9* 9.0* 8.7*  HCT 31.2* 27.1* 27.8* 27.4* 28.1* 26.7*  MCV 102.6* 101.5*  --  102.2* 102.9* 102.3*  PLT 167 145*  --  179 175 191     Cardiac Enzymes: No results for input(s): CKTOTAL, CKMB, CKMBINDEX, TROPONINI in the last 168 hours.  BNP: Invalid input(s): POCBNP  CBG: Recent Labs  Lab 08/08/21 1653 08/08/21 2120 08/09/21 0749 08/10/21 0742 08/10/21 1130  GLUCAP 109* 113* 86 88 155*     Microbiology: Results for orders placed or performed during the hospital encounter of 08/05/21  SARS CORONAVIRUS 2 (TAT 6-24 HRS) Nasopharyngeal Nasopharyngeal Swab     Status: None   Collection Time: 08/05/21  9:17 PM   Specimen: Nasopharyngeal Swab  Result Value Ref Range Status   SARS Coronavirus 2 NEGATIVE NEGATIVE Final    Comment: (NOTE) SARS-CoV-2 target nucleic acids are NOT DETECTED.  The SARS-CoV-2 RNA is generally detectable in upper and lower respiratory specimens during the acute phase of infection. Negative results do not preclude SARS-CoV-2 infection, do not rule out co-infections with other pathogens, and should not be used as the sole basis for treatment or other patient management decisions. Negative results must be combined with clinical observations, patient history, and epidemiological information. The expected result is Negative.  Fact Sheet for Patients: SugarRoll.be  Fact Sheet for Healthcare Providers: https://www.woods-mathews.com/  This test is  not yet approved or cleared by the [place] FDA and  has been authorized for detection and/or diagnosis of SARS-CoV-2 by FDA under an Emergency Use Authorization (EUA). This EUA will remain  in effect (meaning this test can be used) for the duration of the COVID-19 declaration under Se ction 564(b)(1) of the Act, 21 U.S.C. section 360bbb-3(b)(1), unless the authorization is terminated or revoked sooner.  Performed at [facility], [place] [address] [place], [place] [address]     Coagulation Studies: No results for input(s): LABPROT, INR in the last 72 hours.  Urinalysis: No results for input(s): COLORURINE, LABSPEC, PHURINE, GLUCOSEU, HGBUR, BILIRUBINUR, KETONESUR, PROTEINUR, UROBILINOGEN, NITRITE, LEUKOCYTESUR in the last 72 hours.  Invalid input(s): APPERANCEUR    Imaging: US Abdomen Complete  Result Date: 08/10/2021 CLINICAL DATA:  Epigastric pain. EXAM: ABDOMEN ULTRASOUND COMPLETE COMPARISON:  CT abdomen pelvis 08/05/2021 FINDINGS: Gallbladder: No gallstones or wall thickening visualized. No sonographic Murphy sign noted by sonographer. Common bile duct: Diameter: 3 mm. Liver: No focal lesion identified. Within normal limits in parenchymal echogenicity. Portal vein is patent on color Doppler imaging with normal direction of blood flow towards the liver. IVC: No abnormality visualized. Pancreas: Visualized portion unremarkable. Spleen: Size and appearance within normal limits. Right Kidney: Length: 7.7 cm. Atrophic. Echogenicity increased. 1 cm simple appearing cystic lesion. Subcentimeter hypoechoic lesions are too small to characterize. No mass or hydronephrosis visualized. Left Kidney: Length: 9.4 cm. Borderline atrophic. Echogenicity increased. There is a 3.6 cm simple appearing cystic lesion. No mass or hydronephrosis visualized. Abdominal aorta: No aneurysm visualized. Other findings: None. IMPRESSION: Atrophic kidneys with increased parenchymal echogenicity suggestive of  renal parenchymal disease. Electronically Signed   By: Iven Finn M.D.   On: 08/10/2021 20:14     Medications:    piperacillin-tazobactam (ZOSYN)  IV 2.25 g (08/11/21 0522)    (feeding supplement) PROSource Plus  30 mL Oral BID BM   buPROPion  300 mg Oral QHS   Chlorhexidine Gluconate Cloth  6 each Topical Q0600   darbepoetin (ARANESP) injection - DIALYSIS  60 mcg Intravenous Q Mon-HD   dicyclomine  10 mg Oral Q8H   DULoxetine  60 mg Oral QHS   heparin injection (subcutaneous)  5,000 Units Subcutaneous Q8H   pantoprazole  40 mg Oral Daily   pilocarpine  5 mg Oral BID   polyethylene glycol  17 g Oral BID   senna-docusate  2 tablet Oral QHS   sevelamer carbonate  1,600 mg Oral TID WC   sodium chloride flush  3 mL Intravenous Q12H   acetaminophen **OR** acetaminophen, HYDROmorphone (DILAUDID) injection, lidocaine, LORazepam, polyvinyl alcohol  Assessment/ Plan:  Abdominal pain/enteritis - infectious versus inflammatory enteritis, ischemia.  WBC improved from 22K > 11K > 9K .   Per pmd/ GI/ gen surgery.  ESRD -HD TTS.  Had HD here 8/26, only had 2 HD sessions last week due to staffing issues/ high census.  Labs and vol okay. Next HD 08/11/2021 to get back on schedule.  Hypertension/volume  - BP stable, not on antihypertensives.  Does not appear volume overloaded. Up 1-2kg.  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 Continue to follow.  Metabolic bone disease -we will continue to follow we will place patient back on regular calcium bath.  Continue to hold vitamin D products Anxiety/depression - on meds per admit Type 2 diabetes mellitus- Rx per admit Nutrition - Alb 2.6.  Protein supplements.    LOS: [place] @TODAY @7 :05 AM

## 2021-08-11 NOTE — Progress Notes (Addendum)
Daily Rounding Note  08/11/2021, 12:44 PM  LOS: 5 days   SUBJECTIVE:   Chief complaint: Abdominal pain.  Small bowel enteritis  Ready to go home.  Pain now in mid upper belly is much better, no pain meds > 48 hours.  No nausea.  Tolerating renal, solid diet.    OBJECTIVE:         Vital signs in last 24 hours:    Temp:  [98.2 F (36.8 C)-98.8 F (37.1 C)] 98.2 F (36.8 C) (08/30 1225) Pulse Rate:  [73-78] 73 (08/30 1232) Resp:  [14-21] 14 (08/30 1232) BP: (100-156)/(46-69) 121/47 (08/30 1232) SpO2:  [95 %-100 %] 100 % (08/30 1225) Weight:  [99.1 kg] 99.1 kg (08/30 1225) Last BM Date: 08/11/21 Filed Weights   08/07/21 0743 08/07/21 1110 08/11/21 1225  Weight: 99.2 kg 99.2 kg 99.1 kg   General: looks well, comfortable   Heart: RRR Chest: no cough or dyspnea Abdomen: soft, obese, active BS.  Mild tenderness in upper mid and RUQ abdomen.  Active BS.  No distention  Extremities: no CCE Neuro/Psych:  oriented x 3.  Talkative.  No gross deficits.     Lab Results: Recent Labs    08/09/21 0325 08/10/21 0300 08/11/21 0222  WBC 9.9 8.5 9.9  HGB 8.9* 9.0* 8.7*  HCT 27.4* 28.1* 26.7*  PLT 179 175 191   BMET Recent Labs    08/09/21 0325 08/10/21 0300 08/11/21 0222  NA 134* 133* 134*  K 3.5 3.6 3.5  CL 96* 96* 97*  CO2 26 21* 24  GLUCOSE 62* 79 121*  BUN 35* 46* 53*  CREATININE 4.33* 4.91* 5.49*  CALCIUM 9.1 9.0 9.0   LFT Recent Labs    08/09/21 0325 08/10/21 0300 08/11/21 0222  PROT 5.7* 5.5* 5.5*  ALBUMIN 2.4* 2.3* 2.3*  AST 16 20 33  ALT 15 18 27   ALKPHOS 121 110 118  BILITOT 0.7 0.8 0.8   PT/INR No results for input(s): LABPROT, INR in the last 72 hours. Hepatitis Panel No results for input(s): HEPBSAG, HCVAB, HEPAIGM, HEPBIGM in the last 72 hours.  Studies/Results: US Abdomen Complete  Result Date: 08/10/2021 CLINICAL DATA:  Epigastric pain. EXAM: ABDOMEN ULTRASOUND COMPLETE COMPARISON:   CT abdomen pelvis 08/05/2021 FINDINGS: Gallbladder: No gallstones or wall thickening visualized. No sonographic Murphy sign noted by sonographer. Common bile duct: Diameter: 3 mm. Liver: No focal lesion identified. Within normal limits in parenchymal echogenicity. Portal vein is patent on color Doppler imaging with normal direction of blood flow towards the liver. IVC: No abnormality visualized. Pancreas: Visualized portion unremarkable. Spleen: Size and appearance within normal limits. Right Kidney: Length: 7.7 cm. Atrophic. Echogenicity increased. 1 cm simple appearing cystic lesion. Subcentimeter hypoechoic lesions are too small to characterize. No mass or hydronephrosis visualized. Left Kidney: Length: 9.4 cm. Borderline atrophic. Echogenicity increased. There is a 3.6 cm simple appearing cystic lesion. No mass or hydronephrosis visualized. Abdominal aorta: No aneurysm visualized. Other findings: None. IMPRESSION: Atrophic kidneys with increased parenchymal echogenicity suggestive of renal parenchymal disease. Electronically Signed   By: Iven Finn M.D.   On: 08/10/2021 20:14    Scheduled Meds:  (feeding supplement) PROSource Plus  30 mL Oral BID BM   buPROPion  300 mg Oral QHS   Chlorhexidine Gluconate Cloth  6 each Topical Q0600   darbepoetin (ARANESP) injection - DIALYSIS  60 mcg Intravenous Q Tue-HD   dicyclomine  10 mg Oral Q8H   DULoxetine  60 mg Oral QHS   heparin injection (subcutaneous)  5,000 Units Subcutaneous Q8H   pantoprazole  40 mg Oral Daily   pilocarpine  5 mg Oral BID   polyethylene glycol  17 g Oral BID   senna-docusate  2 tablet Oral QHS   sevelamer carbonate  1,600 mg Oral TID WC   sodium chloride flush  3 mL Intravenous Q12H   Continuous Infusions:  piperacillin-tazobactam (ZOSYN)  IV 2.25 g (08/11/21 1202)   PRN Meds:.acetaminophen **OR** acetaminophen, HYDROmorphone (DILAUDID) injection, lidocaine, LORazepam, polyvinyl alcohol   ASSESMENT:   Lower abdominal  pain.  Normal LFTs. 08/05/21 CTAP  w/o contrast: short segment small bowel thickening suspicious for enteritis 08/10/2021 ultrasound abdomen unremarkable in terms of liver, biliary tree, spleen.  Portal vein circulation normal Day 7 Zosyn Clinically pt is better and she wants to go home     Macrocytic anemia.  B12, folate not assayed.  On Aranesp.  ESRD.  TTS hemodialysis.   PLAN     Home today, after finishes up w HD?.  If goes home, ? Additional days of abx at discharge?      [name]  08/11/2021, 12:44 PM Phone [phone]    ________________________________________________________________________  [facility] Heckler GI MD note:  I reviewed the data and agree with the assessment and plan described above. She feels ready for discharge and that seems safe from my perspective. Would complete another 3-5 days of oral abx at time of d/c. We will arrange follow up in our office.   Owens Loffler, MD [facility] Pager [phone]

## 2021-08-11 NOTE — Discharge Summary (Signed)
Physician Discharge Summary  MANHATTAN MCCUEN OJJ:[id number] DOB: Apr 24, 1951 DOA: 08/05/2021  PCP: Cassandria Anger, MD  Admit date: 08/05/2021 Discharge date: 08/11/2021  Time spent: 40 minutes  Recommendations for Outpatient Follow-up:  Follow outaptietn CBC/CMP Follow enteritis outpatient with GI    Discharge Diagnoses:  Principal Problem:   Sepsis ([place]) Active Problems:   Diabetes mellitus type 2, controlled ([place])   HYPERCHOLESTEROLEMIA   Anemia   Anxiety disorder   Depression   Essential hypertension   GERD   LOW BACK PAIN   ESRD (end stage renal disease) on dialysis [facility])   Lumbar spondylosis with myelopathy   Enteritis   Discharge Condition: stable  Diet recommendation: renal   Filed Weights   08/07/21 0743 08/07/21 1110 08/11/21 1225  Weight: 99.2 kg 99.2 kg 99.1 kg    History of present illness:  70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  She was sent to ED for further evaluation after being found to have leukocytosis in the 20,000s.    CT showed evidence of enteritis.  Surgery and GI were consulted.  She's gradually improved with abx.  Plan is to discharge with abx and follow outpatient with GI.   Hospital Course:  Enteritis Imaging concerning for infectious vs inflammatory enteritis.  Ischemia on differential. Received IV abx, discharged with augmentin x 5 days Abdominal discomfort improved, planning to follow outpatient with GI General surgery c/s, appreciate recommendations - conservative management GI consult, appreciate recs - recommended [place] which was without acute findings - recommending d/c home with 3-5 days of abx, they'll follow up in office   Constipation Bowel regimen    Hypotension improved   ESRD Dialysis (TTS) per renal  (dialysis today on schedule) Sevelamer     Anxiety  Depression Wellbutrin, cymbalta Ativan prn   Chronic Pain Cymbalta Oxycodone prn   T2DM Resume home meds   Anemia Stable, follow    Hx Colitis Noted, GI c/s as noted above   GERD  PPI  Procedures: none  Consultations: GI Surgery renal  Discharge Exam: Vitals:   08/11/21 1500 08/11/21 1637  BP: 121/63 (!) 144/64  Pulse: 77 80  Resp: 12 17  Temp:  98.6 F (37 C)  SpO2:  98%   Feeling better Eager to recover further at home  General: No acute distress. Cardiovascular: RRR Lungs: unlabored Abdomen: mild epigastric discomfort Neurological: Alert and oriented 3. Moves all extremities 4 . Cranial nerves II through XII grossly intact. Skin: Warm and dry. No rashes or lesions. Extremities: No clubbing or cyanosis. No edema.  Discharge Instructions   Discharge Instructions     Call MD for:  difficulty breathing, headache or visual disturbances   Complete by: As directed    Call MD for:  extreme fatigue   Complete by: As directed    Call MD for:  hives   Complete by: As directed    Call MD for:  persistant dizziness or light-headedness   Complete by: As directed    Call MD for:  persistant nausea and vomiting   Complete by: As directed    Call MD for:  redness, tenderness, or signs of infection (pain, swelling, redness, odor or green/yellow discharge around incision site)   Complete by: As directed    Call MD for:  severe uncontrolled pain   Complete by: As  directed    Call MD for:  temperature >100.4   Complete by: As directed    Discharge instructions   Complete by: As directed    You were seen for enteritis.  You've improved with antibiotics.  We'll plan for follow up with gastroenterology as an outpatient.  [facility] should call you about Zonie Crutcher follow up appointment.   Return for new, recurrent, or worsening symptoms.  Please ask your PCP to request records from this hospitalization so they know what was done and what the next steps  will be.   Increase activity slowly   Complete by: As directed       Allergies as of 08/11/2021       Reactions   Erythromycin Diarrhea, Nausea And Vomiting   Gabapentin Other (See Comments)   Confusion and falling         Medication List     TAKE these medications    acetaminophen 500 MG tablet Commonly known as: TYLENOL Take 1,000 mg by mouth every 6 (six) hours as needed for moderate pain or headache.   amoxicillin 500 MG capsule Commonly known as: AMOXIL TAKE 4 CAPSULES BY MOUTH 1  HOUR PRIOR TO DENTAL  PROCEDURE/CLEANING What changed:  how much to take how to take this when to take this additional instructions Another medication with the same name was removed. Continue taking this medication, and follow the directions you see here.   amoxicillin-clavulanate 500-125 MG tablet Commonly known as: Augmentin Take 1 tablet (500 mg total) by mouth at bedtime for 5 days.   blood glucose meter kit and supplies Dispense based on patient and insurance preference. Used to check blood sugar daily, DX: E11.9 OneTouch   buPROPion 300 MG 24 hr tablet Commonly known as: WELLBUTRIN XL Take 1 tablet (300 mg total) by mouth at bedtime.   cyclobenzaprine 10 MG tablet Commonly known as: FLEXERIL Take 1 tablet (10 mg total) by mouth 3 (three) times daily as needed for muscle spasms.   dicyclomine 20 MG tablet Commonly known as: BENTYL Take one tablet every 4-6 hours as needed for abdominal pain What changed:  how much to take how to take this when to take this reasons to take this additional instructions   DULoxetine 60 MG capsule Commonly known as: CYMBALTA Take 1 capsule (60 mg total) by mouth at bedtime.   fexofenadine 180 MG tablet Commonly known as: ALLEGRA Take 1 tablet (180 mg total) by mouth daily. What changed:  when to take this reasons to take this   glimepiride 1 MG tablet Commonly known as: AMARYL Take 1 tablet (1 mg total) by mouth daily before  breakfast.   ketoconazole 2 % cream Commonly known as: NIZORAL APPLY TO THE AFFECTED AREA(S) BY TOPICAL ROUTE ONCE DAILY   lidocaine 5 % ointment Commonly known as: XYLOCAINE APPLY TO AFFECTED AREA(S)  TOPICALLY 3 TIMES DAILY AS  NEEDED   lidocaine-prilocaine cream Commonly known as: EMLA SMARTSIG:Sparingly Topical As Directed   LORazepam 2 MG tablet Commonly known as: ATIVAN TAKE 1 TABLET BY MOUTH ONCE EVERY NIGHT AT BEDTIME AS NEEDED INSOMNIA   LORazepam 2 MG tablet Commonly known as: ATIVAN TAKE 1 TABLET BY MOUTH ONCE EVERY NIGHT AT BEDTIME AS NEEDED INSOMNIA   onetouch ultrasoft lancets Used to check blood sugar daily, DX: E11.9   oxyCODONE 15 MG immediate release tablet Commonly known as: ROXICODONE Take 1 tablet (15 mg total) by mouth 4 (four) times daily as needed. What changed: reasons to take **Note De-Identified vi Obfusction** this   pntoprzole 40 MG tblet Commonly known s: PROTONIX Tke 1 tblet (40 mg totl) by mouth dily.   pilocrpine 5 MG tblet Commonly known s: SALAGEN Tke 1 tblet (5 mg totl) by mouth 2 (two) times dily.   pioglitzone 15 MG tblet Commonly known s: ACTOS Tke 1 tblet (15 mg totl) by mouth dily.   polyethylene glycol 17 g pcket Commonly known s: MIRALAX / GLYCOLAX Tke 17 g by mouth dily s needed (constiption.).   polyvinyl lcohol 1.4 % ophthlmic solution Commonly known s: LIQUIFILM TEARS Plce 1 drop into both eyes dily s needed for dry eyes.   RENA-VITE PO Tke 1 tblet  dy   sevelmer crbonte 800 MG tblet Commonly known s: RENVELA Tke 1,600 mg by mouth 3 (three) times dily with mels. Tke 2 tblets tid nd 1 tblet if eting  snck   TRUEtest Test test strip Generic drug: glucose blood Used to check blood sugr dily, DX: E11.9       Allergies  Allergen Rections   Erythromycin Dirrhe nd Nuse And Vomiting   Gbpentin Other (See Comments)    Confusion nd flling     Follow-up Informtion      Norlyn Pick, NP Follow up on 09/14/2021.   Specilty: Gstroenterology Why: 11 AM follow up with NP t [facility] office. Contct informtion: [place] [address] [phone]                  The results of significnt dignostics from this hospitliztion (including imging, microbiology, ncillry nd lbortory) re listed below for reference.    Significnt Dignostic Studies: CT ABDOMEN PELVIS WO CONTRAST  Result Dte: 08/05/2021 CLINICAL DATA:  Abdominl pin nd fever. Elevted white blood cell count. Dilysis ptient. EXAM: CT ABDOMEN AND PELVIS WITHOUT CONTRAST TECHNIQUE: Multidetector CT imging of the bdomen nd pelvis ws performed following the stndrd protocol without IV contrst. COMPARISON:  Rdiogrph erlier tody FINDINGS: Lower chest: No cute irspce disese or pleurl effusion. Hert is norml in size. Heptobiliry: There is no focl heptic bnormlity. Mild gllbldder distention without clcified gllstone. No pericholecystic ft strnding. No biliry dilttion. Pncres: Mild trophy.  No ductl dilttion or inflmmtion. Spleen: Norml in size without focl bnormlity. Adrenls/Urinry Trct: Miniml left drenl thickening. No drenl nodule. Bilterl renl prenchyml trophy consistent with chronic renl disese. 3.3 cm cyst in the periphery of the left kidney, with dditionl smller renl corticl lesions re too smll to chrcterize. No evidence of suspicious solid lesion. Urinry bldder is ner completely empty. Stomch/Bowel: Detiled bowel ssessment is limited in the bsence of enteric contrst. The stomch is nondistended. Short segment of smll bowel in the right mid bdomen demonstrtes mild distension, feclized contents, wll thickening nd perienteric ft strnding, for exmple series 3, imge 56. this does not pper to represent  blind-ending tubulr structure Meckel's diverticulum, rther  loop of your tight  it smll bowel. Norml ppendix, series 6, imge 36. Moderte volume of stool throughout the colon. There is trnsverse nd sigmoid colonic redundncy. Mild sigmoid diverticulosis without focl diverticulitis. No colonic inflmmtion. Vsculr/Lymphtic: Aortic therosclerosis. No ortic neurysm. No portl venous or mesenteric gs. Few prominent mesenteric lymph nodes, in the region of smll bowel inflmmtion. There is no bulky bdominopelvic denopthy. Reproductive: Smll clcified uterine fibroids. The uterus is trophic. No dnexl mss. Other: Moderte-sized ft contining umbilicl herni. No bowel involvement or inflmmtion. There is no scites or free ir. Musculoskeletl: Degenertive chnge in the spine. No  focal bone abnormality or acute osseous findings. IMPRESSION: 1. Short segment of small bowel in the right mid abdomen demonstrates mild distension, fecalized contents, wall thickening, and perienteric fat stranding. Findings are suspicious for enteritis, may be infectious or inflammatory. No bowel perforation. 2. Mild sigmoid diverticulosis without focal diverticulitis. 3. Moderate-sized fat containing umbilical hernia. No bowel involvement or inflammation. 4. Sequela of chronic renal disease with renal parenchymal atrophy. Aortic Atherosclerosis (ICD10-I70.0). Electronically Signed   By: Keith Rake M.D.   On: 08/05/2021 18:51   US Abdomen Complete  Result Date: 08/10/2021 CLINICAL DATA:  Epigastric pain. EXAM: ABDOMEN ULTRASOUND COMPLETE COMPARISON:  CT abdomen pelvis 08/05/2021 FINDINGS: Gallbladder: No gallstones or wall thickening visualized. No sonographic Murphy sign noted by sonographer. Common bile duct: Diameter: 3 mm. Liver: No focal lesion identified. Within normal limits in parenchymal echogenicity. Portal vein is patent on color Doppler imaging with normal direction of blood flow towards the liver. IVC: No abnormality visualized. Pancreas: Visualized portion unremarkable.  Spleen: Size and appearance within normal limits. Right Kidney: Length: 7.7 cm. Atrophic. Echogenicity increased. 1 cm simple appearing cystic lesion. Subcentimeter hypoechoic lesions are too small to characterize. No mass or hydronephrosis visualized. Left Kidney: Length: 9.4 cm. Borderline atrophic. Echogenicity increased. There is Naveyah Iacovelli 3.6 cm simple appearing cystic lesion. No mass or hydronephrosis visualized. Abdominal aorta: No aneurysm visualized. Other findings: None. IMPRESSION: Atrophic kidneys with increased parenchymal echogenicity suggestive of renal parenchymal disease. Electronically Signed   By: Iven Finn M.D.   On: 08/10/2021 20:14   DG Chest Port 1 View  Result Date: 08/05/2021 CLINICAL DATA:  Shortness of breath and fevers, initial encounter EXAM: PORTABLE CHEST 1 VIEW COMPARISON:  03/01/2016 FINDINGS: Cardiac shadow is within normal limits. Lungs are well aerated bilaterally. Patchy left basilar airspace opacity is noted consistent with atelectasis as it was not seen on recent CT. No bony abnormality is noted. IMPRESSION: Mild left basilar atelectasis new from prior CT examination. Electronically Signed   By: Inez Catalina M.D.   On: 08/05/2021 20:21   DG ABD ACUTE 2+V W 1V CHEST  Result Date: 08/05/2021 CLINICAL DATA:  Abdominal pain, fever, previous tobacco abuse EXAM: DG ABDOMEN ACUTE WITH 1 VIEW CHEST COMPARISON:  02/22/2018 FINDINGS: Supine and upright frontal views of the abdomen and pelvis are obtained, excluding the flanks by collimation. Upright frontal view of the chest was also performed. Cardiac silhouette is unremarkable. No airspace disease, effusion, or pneumothorax. Bowel gas pattern is unremarkable without obstruction or ileus. There is moderate stool throughout the colon. No masses or abnormal calcifications. No free gas in the greater peritoneal sac. Left convex scoliosis centered in the upper lumbar spine. Diffuse multilevel thoracolumbar spondylosis. IMPRESSION: 1.  Moderate retained stool throughout the colon. 2. No bowel obstruction or ileus. 3. No acute intrathoracic process. Electronically Signed   By: Randa Ngo M.D.   On: 08/05/2021 15:30    Microbiology: Recent Results (from the past 240 hour(s))  SARS CORONAVIRUS 2 (TAT 6-24 HRS) Nasopharyngeal Nasopharyngeal Swab     Status: None   Collection Time: 08/05/21  9:17 PM   Specimen: Nasopharyngeal Swab  Result Value Ref Range Status   SARS Coronavirus 2 NEGATIVE NEGATIVE Final    Comment: (NOTE) SARS-CoV-2 target nucleic acids are NOT DETECTED.  The SARS-CoV-2 RNA is generally detectable in upper and lower respiratory specimens during the acute phase of infection. Negative results do not preclude SARS-CoV-2 infection, do not rule out co-infections with other pathogens, and should not be used  as the sole basis for treatment or other patient management decisions. Negative results must be combined with clinical observations, patient history, and epidemiological information. The expected result is Negative.  Fact Sheet for Patients: SugarRoll.be  Fact Sheet for Healthcare Providers: https://www.woods-mathews.com/  This test is not yet approved or cleared by the [place] FDA and  has been authorized for detection and/or diagnosis of SARS-CoV-2 by FDA under an Emergency Use Authorization (EUA). This EUA will remain  in effect (meaning this test can be used) for the duration of the COVID-19 declaration under Se ction 564(b)(1) of the Act, 21 U.S.C. section 360bbb-3(b)(1), unless the authorization is terminated or revoked sooner.  Performed at [facility], [place] [address] [place], [place] [address]      Labs: Basic Metabolic Panel: Recent Labs  Lab 08/07/21 0341 08/08/21 0224 08/09/21 0325 08/10/21 0300 08/11/21 0222  NA 131* 132* 134* 133* 134*  K 5.5* 3.4* 3.5 3.6 3.5  CL 91* 94* 96* 96* 97*  CO2 _0 21* 24  GLUCOSE  112* 99 62* 79 121*  BUN 55* 22 35* 46* 53*  CREATININE 5.73* 3.34* 4.33* 4.91* 5.49*  CALCIUM 9.5 8.5* 9.1 9.0 9.0  MG 2.6* 2.1 2.3 2.3 2.4  PHOS 4.9* 3.6 4.2 4.2 4.4   Liver Function Tests: Recent Labs  Lab 08/07/21 0341 08/08/21 0224 08/09/21 0325 08/10/21 0300 08/11/21 0222  AST 16 14* 16 20 33  ALT _1 ALKPHOS 77 90 121 110 118  BILITOT 1.2 0.9 0.7 0.8 0.8  PROT 6.2* 5.5* 5.7* 5.5* 5.5*  ALBUMIN 2.8* 2.3* 2.4* 2.3* 2.3*   Recent Labs  Lab 08/05/21 1557 08/05/21 1816  LIPASE 5.0* 20  AMYLASE 20*  --    No results for input(s): AMMONIA in the last 168 hours. CBC: Recent Labs  Lab 08/07/21 0341 08/08/21 0224 08/08/21 1157 08/09/21 0325 08/10/21 0300 08/11/21 0222  WBC 11.3* 11.1*  --  9.9 8.5 9.9  NEUTROABS 8.0* 6.8  --  5.1 4.3 4.9  HGB 10.1* 8.7* 9.0* 8.9* 9.0* 8.7*  HCT 31.2* 27.1* 27.8* 27.4* 28.1* 26.7*  MCV 102.6* 101.5*  --  102.2* 102.9* 102.3*  PLT 167 145*  --  179 175 191   Cardiac Enzymes: No results for input(s): CKTOTAL, CKMB, CKMBINDEX, TROPONINI in the last 168 hours. BNP: BNP (last 3 results) No results for input(s): BNP in the last 8760 hours.  ProBNP (last 3 results) No results for input(s): PROBNP in the last 8760 hours.  CBG: Recent Labs  Lab 08/10/21 0742 08/10/21 1130 08/11/21 0743 08/11/21 1213 08/11/21 1635  GLUCAP 88 155* 89 156* 85       Signed:  Fayrene Helper MD.  [facility] 08/11/2021, 5:15 PM

## 2021-08-11 NOTE — Progress Notes (Signed)
Discharge teaching complete. Meds, diet, activity, follow up appointments reviewed and all questions answered. Copy of instructions  and prescription given to patient.

## 2021-08-11 NOTE — Progress Notes (Signed)
Patient back to floor from dialysis.

## 2021-08-12 ENCOUNTER — Telehealth: Payer: Self-pay | Admitting: Nurse Practitioner

## 2021-08-12 DIAGNOSIS — N186 End stage renal disease: Secondary | ICD-10-CM | POA: Diagnosis not present

## 2021-08-12 DIAGNOSIS — Z992 Dependence on renal dialysis: Secondary | ICD-10-CM | POA: Diagnosis not present

## 2021-08-12 DIAGNOSIS — E1022 Type 1 diabetes mellitus with diabetic chronic kidney disease: Secondary | ICD-10-CM | POA: Diagnosis not present

## 2021-08-12 NOTE — Telephone Encounter (Signed)
Transition of care contact from inpatient facility  Date of discharge: 08/11/2021 Date of contact: 08/12/2021 Method: Phone Spoke to: Patient  Patient contacted to discuss transition of care from recent inpatient hospitalization. Patient was admitted to [facility] from 08/24-08/30/2022 with discharge diagnosis of Enteritis  Medication changes were reviewed.  Patient will follow up with his/her outpatient HD unit on: 08/13/2021

## 2021-08-13 ENCOUNTER — Telehealth: Payer: Self-pay

## 2021-08-13 DIAGNOSIS — Z992 Dependence on renal dialysis: Secondary | ICD-10-CM | POA: Diagnosis not present

## 2021-08-13 DIAGNOSIS — N186 End stage renal disease: Secondary | ICD-10-CM | POA: Diagnosis not present

## 2021-08-13 DIAGNOSIS — N2581 Secondary hyperparathyroidism of renal origin: Secondary | ICD-10-CM | POA: Diagnosis not present

## 2021-08-13 DIAGNOSIS — D688 Other specified coagulation defects: Secondary | ICD-10-CM | POA: Diagnosis not present

## 2021-08-13 DIAGNOSIS — G8929 Other chronic pain: Secondary | ICD-10-CM | POA: Diagnosis not present

## 2021-08-13 DIAGNOSIS — D631 Anemia in chronic kidney disease: Secondary | ICD-10-CM | POA: Diagnosis not present

## 2021-08-13 NOTE — Progress Notes (Deleted)
Chronic Care Management Pharmacy Assistant   Name: NAEEMA PATLAN  MRN: [id number] DOB: [birth year]  [name] is an 70 y.o. year old female who presents for his initial CCM visit with the clinical pharmacist.   Recent office visits:  08/05/21 [name] MD - Seen for abdominal pain - Labs ordered - Discontinued cephALEXin - Advised to go to ED - follow up in 3 days 06/17/21 [name] MD - Follow up visit - No medication changes noted - Return in 6 months   Recent consult visits:  04/11/21 - [name] - Nephrology - No notes available  03/18/21 [name] - Hand surgery - No notes navailable  03/11/21 [name] - Nephrology - No notes available  03/04/21 [name] PA-C - Vascular Surgery - Seen for evaluation of her hemodialysis - No med changes and no follow ups noted. 02/20/21 [place] No notes available   Hospital visits:  Medication Reconciliation was completed by comparing discharge summary, patient's EMR and Pharmacy list, and upon discussion with patient.  Admitted to the hospital on 08/05/21 due to Sepsis Discharge date was 08/11/21. Discharged from [place]?Medications Started at [facility] Discharge:?? None noted  Medication Changes at Hospital Discharge: None noted  Medications Discontinued at Hospital Discharge: None noted  Medications that remain the same after Hospital Discharge:??  -All other medications will remain the same.    Medications: Outpatient Encounter Medications as of 08/13/2021  Medication Sig Note   acetaminophen (TYLENOL) 500 MG tablet Take 1,000 mg by mouth every 6 (six) hours as needed for moderate pain or headache.    amoxicillin (AMOXIL) 500 MG capsule TAKE 4 CAPSULES BY MOUTH 1  HOUR PRIOR TO DENTAL  PROCEDURE/CLEANING (Patient taking differently: Take 2,000 mg by mouth See admin instructions. Take 2000 mg 1 hour prior to dental work)    amoxicillin-clavulanate  (AUGMENTIN) 500-125 MG tablet Take 1 tablet (500 mg total) by mouth at bedtime for 5 days.    B Complex-C-Folic Acid (RENA-VITE PO) Take 1 tablet a day    blood glucose meter kit and supplies Dispense based on patient and insurance preference. Used to check blood sugar daily, DX: E11.9 OneTouch    buPROPion (WELLBUTRIN XL) 300 MG 24 hr tablet Take 1 tablet (300 mg total) by mouth at bedtime.    cyclobenzaprine (FLEXERIL) 10 MG tablet Take 1 tablet (10 mg total) by mouth 3 (three) times daily as needed for muscle spasms.    dicyclomine (BENTYL) 20 MG tablet Take one tablet every 4-6 hours as needed for abdominal pain (Patient taking differently: Take 20 mg by mouth every 4 (four) hours as needed (abdominal pain).)    DULoxetine (CYMBALTA) 60 MG capsule Take 1 capsule (60 mg total) by mouth at bedtime.    fexofenadine (ALLEGRA) 180 MG tablet Take 1 tablet (180 mg total) by mouth daily. (Patient taking differently: Take 180 mg by mouth daily as needed for allergies.)    glimepiride (AMARYL) 1 MG tablet Take 1 tablet (1 mg total) by mouth daily before breakfast.    glucose blood (TRUETEST TEST) test strip Used to check blood sugar daily, DX: E11.9    ketoconazole (NIZORAL) 2 % cream APPLY TO THE AFFECTED AREA(S) BY TOPICAL ROUTE ONCE DAILY    Lancets (ONETOUCH ULTRASOFT) lancets Used to check blood sugar daily, DX: E11.9    lidocaine (XYLOCAINE) 5 % ointment APPLY TO AFFECTED AREA(S)  TOPICALLY 3 TIMES DAILY AS  NEEDED    lidocaine-prilocaine (EMLA) cream SMARTSIG:Sparingly Topical As Directed    LORazepam (ATIVAN) 2 MG tablet TAKE 1 TABLET BY MOUTH ONCE EVERY NIGHT AT BEDTIME AS NEEDED INSOMNIA (Patient not taking: Reported on 08/05/2021)    LORazepam (ATIVAN) 2 MG tablet TAKE 1 TABLET BY MOUTH ONCE EVERY NIGHT AT BEDTIME AS NEEDED INSOMNIA    oxyCODONE (ROXICODONE) 15 MG immediate release tablet Take 1 tablet (15 mg total) by mouth 4 (four) times daily as needed. (Patient taking differently: Take 15 mg  by mouth 4 (four) times daily as needed for pain.)    pantoprazole (PROTONIX) 40 MG tablet Take 1 tablet (40 mg total) by mouth daily.    pilocarpine (SALAGEN) 5 MG tablet Take 1 tablet (5 mg total) by mouth 2 (two) times daily. 08/05/2021: Takes for dry mouth   pioglitazone (ACTOS) 15 MG tablet Take 1 tablet (15 mg total) by mouth daily.    polyethylene glycol (MIRALAX / GLYCOLAX) 17 g packet Take 17 g by mouth daily as needed (constipation.).    polyvinyl alcohol (LIQUIFILM TEARS) 1.4 % ophthalmic solution Place 1 drop into both eyes daily as needed for dry eyes.    sevelamer carbonate (RENVELA) 800 MG tablet Take 1,600 mg by mouth 3 (three) times daily with meals. Take 2 tablets tid and 1 tablet if eating a snack    No facility-administered encounter medications on file as of 08/13/2021.    acetaminophen (TYLENOL) 500 MG tablet Last filled: 08/28/20, No DS noted amoxicillin (AMOXIL) 500 MG capsule Last filled:08/11/21 5  amoxicillin-clavulanate (AUGMENTIN) 500-125 MG tablet Last filled: 06/19/21 5 DS B Complex-C-Folic Acid (RENA-VITE PO) blood glucose meter kit and supplies buPROPion (WELLBUTRIN XL) 300 MG 24 hr tablet cyclobenzaprine (FLEXERIL) 10 MG tablet dicyclomine (BENTYL) 20 MG tablet DULoxetine (CYMBALTA) 60 MG capsule fexofenadine (ALLEGRA) 180 MG tablet glimepiride (AMARYL) 1 MG tablet glucose blood (TRUETEST TEST) test strip ketoconazole (NIZORAL) 2 % cream Lancets (ONETOUCH ULTRASOFT) lancets lidocaine (XYLOCAINE) 5 % ointment lidocaine-prilocaine (EMLA) cream LORazepam (ATIVAN) 2 MG tablet LORazepam (ATIVAN) 2 MG tablet oxyCODONE (ROXICODONE) 15 MG immediate release tablet pantoprazole (PROTONIX) 40 MG tablet pilocarpine (SALAGEN) 5 MG tablet pioglitazone (ACTOS) 15 MG tablet polyethylene glycol (MIRALAX / GLYCOLAX) 17 g packet polyvinyl alcohol (LIQUIFILM TEARS) 1.4 % ophthalmic solution sevelamer carbonate (RENVELA) 800 MG tablet       Star Rating  Drugs:  SIG***

## 2021-08-13 NOTE — Chronic Care Management (AMB) (Signed)
Chronic Care Management Pharmacy Assistant   Name: [name]  MRN: [id number] DOB: 11/24/1951  [name] is an 70 y.o. year old female who presents for his initial CCM visit with the clinical pharmacist.    Recent office visits:  08/05/21 Anita Calico MD - Seen for abdominal pain - Labs ordered - Discontinued cephALEXin - Advised to go to ED - follow up in 3 days 06/17/21 [name]. Plotnikov MD - Follow up visit - No medication changes noted - Return in 6 months    Recent consult visits:  04/11/21 - [name] - Nephrology - No notes available  03/18/21 [name] - Hand surgery - No notes navailable  03/11/21 [name] - Nephrology - No notes available  03/04/21 Anita Shouts Rhyne PA-C - Vascular Surgery - Seen for evaluation of her hemodialysis - No med changes and no follow ups noted. 02/20/21 [name] No notes available    Hospital visits:  Medication Reconciliation was completed by comparing discharge summary, patient's EMR and Pharmacy list, and upon discussion with patient.   Admitted to the hospital on 08/05/21 due to Sepsis Discharge date was 08/11/21. Discharged from [place]?Medications Started at [facility] Discharge:?? None noted   Medication Changes at Hospital Discharge: None noted   Medications Discontinued at Hospital Discharge: None noted   Medications that remain the same after Hospital Discharge:??  -All other medications will remain the same.    Medications: Outpatient Encounter Medications as of 08/13/2021  Medication Sig Note   acetaminophen (TYLENOL) 500 MG tablet Take 1,000 mg by mouth every 6 (six) hours as needed for moderate pain or headache.    amoxicillin (AMOXIL) 500 MG capsule TAKE 4 CAPSULES BY MOUTH 1  HOUR PRIOR TO DENTAL  PROCEDURE/CLEANING (Patient taking differently: Take 2,000 mg by mouth See admin instructions. Take 2000 mg 1 hour prior to dental work)     amoxicillin-clavulanate (AUGMENTIN) 500-125 MG tablet Take 1 tablet (500 mg total) by mouth at bedtime for 5 days.    B Complex-C-Folic Acid (RENA-VITE PO) Take 1 tablet a day    blood glucose meter kit and supplies Dispense based on patient and insurance preference. Used to check blood sugar daily, DX: E11.9 OneTouch    buPROPion (WELLBUTRIN XL) 300 MG 24 hr tablet Take 1 tablet (300 mg total) by mouth at bedtime.    cyclobenzaprine (FLEXERIL) 10 MG tablet Take 1 tablet (10 mg total) by mouth 3 (three) times daily as needed for muscle spasms.    dicyclomine (BENTYL) 20 MG tablet Take one tablet every 4-6 hours as needed for abdominal pain (Patient taking differently: Take 20 mg by mouth every 4 (four) hours as needed (abdominal pain).)    DULoxetine (CYMBALTA) 60 MG capsule Take 1 capsule (60 mg total) by mouth at bedtime.    fexofenadine (ALLEGRA) 180 MG tablet Take 1 tablet (180 mg total) by mouth daily. (Patient taking differently: Take 180 mg by mouth daily as needed for allergies.)    glimepiride (AMARYL) 1 MG tablet Take 1 tablet (1 mg total) by mouth daily before breakfast.    glucose blood (TRUETEST TEST) test strip Used to check blood sugar daily, DX: E11.9    ketoconazole (NIZORAL) 2 % cream APPLY TO THE AFFECTED AREA(S) BY TOPICAL ROUTE ONCE DAILY    Lancets (ONETOUCH ULTRASOFT) lancets Used to check blood sugar daily, DX: E11.9    lidocaine (  XYLOCAINE) 5 % ointment APPLY TO AFFECTED AREA(S)  TOPICALLY 3 TIMES DAILY AS  NEEDED    lidocaine-prilocaine (EMLA) cream SMARTSIG:Sparingly Topical As Directed    LORazepam (ATIVAN) 2 MG tablet TAKE 1 TABLET BY MOUTH ONCE EVERY NIGHT AT BEDTIME AS NEEDED INSOMNIA (Patient not taking: Reported on 08/05/2021)    LORazepam (ATIVAN) 2 MG tablet TAKE 1 TABLET BY MOUTH ONCE EVERY NIGHT AT BEDTIME AS NEEDED INSOMNIA    oxyCODONE (ROXICODONE) 15 MG immediate release tablet Take 1 tablet (15 mg total) by mouth 4 (four) times daily as needed. (Patient taking  differently: Take 15 mg by mouth 4 (four) times daily as needed for pain.)    pantoprazole (PROTONIX) 40 MG tablet Take 1 tablet (40 mg total) by mouth daily.    pilocarpine (SALAGEN) 5 MG tablet Take 1 tablet (5 mg total) by mouth 2 (two) times daily. 08/05/2021: Takes for dry mouth   pioglitazone (ACTOS) 15 MG tablet Take 1 tablet (15 mg total) by mouth daily.    polyethylene glycol (MIRALAX / GLYCOLAX) 17 g packet Take 17 g by mouth daily as needed (constipation.).    polyvinyl alcohol (LIQUIFILM TEARS) 1.4 % ophthalmic solution Place 1 drop into both eyes daily as needed for dry eyes.    sevelamer carbonate (RENVELA) 800 MG tablet Take 1,600 mg by mouth 3 (three) times daily with meals. Take 2 tablets tid and 1 tablet if eating a snack    No facility-administered encounter medications on file as of 08/13/2021.    acetaminophen (TYLENOL) 500 MG tablet Last filled: 08/28/20, No DS noted amoxicillin (AMOXIL) 500 MG capsule Last filled:08/11/21 5 DS amoxicillin-clavulanate (AUGMENTIN) 500-125 MG tablet Last filled: 06/19/21 5 DS B Complex-C-Folic Acid (RENA-VITE PO) Last filled: 08/03/21 90 DS buPROPion (WELLBUTRIN XL) 300 MG 24 hr tablet Last filled:03/19/21 90 DS cyclobenzaprine (FLEXERIL) 10 MG tablet Last filled: 04/10/21 90 DS dicyclomine (BENTYL) 20 MG tablet Last filled: 08/28/20 No DS noted. DULoxetine (CYMBALTA) 60 MG capsule Last filled: 05/31/21 90 DS  fexofenadine (ALLEGRA) 180 MG tablet no DS or fill date listed glimepiride (AMARYL) 1 MG tablet Last filled: 08/04/21 90 DS ketoconazole (NIZORAL) 2 % cream Last filled: 05/05/21 90 DS lidocaine (XYLOCAINE) 5 % ointment Last filled: 06/03/21 60 DS lidocaine-prilocaine (EMLA) cream Last filled: 06/09/21 30 DS LORazepam (ATIVAN) 2 MG tablet Last filled: 06/09/21 90 DS oxyCODONE (ROXICODONE) 15 MG immediate release tablet Last filled: 07/10/21 30 DS pantoprazole (PROTONIX) 40 MG tablet Last filled: 03/19/21 90 DS pilocarpine (SALAGEN) 5 MG tablet Last  filled: 08/04/21 90 DS pioglitazone (ACTOS) 15 MG tablet Last filled: 05/31/21 90 DS polyethylene glycol (MIRALAX / GLYCOLAX) 17 g packet polyvinyl alcohol (LIQUIFILM TEARS) 1.4 % ophthalmic solution Last filled: 08/28/21 DS not noted sevelamer carbonate (RENVELA) 800 MG tablet Last filled: 07/07/21 30 DS  Star Rating Drugs: None noted   Andee Poles, CMA

## 2021-08-14 ENCOUNTER — Other Ambulatory Visit: Payer: Self-pay

## 2021-08-14 ENCOUNTER — Ambulatory Visit (INDEPENDENT_AMBULATORY_CARE_PROVIDER_SITE_OTHER): Payer: Medicare Other

## 2021-08-14 ENCOUNTER — Ambulatory Visit: Payer: Medicare Other

## 2021-08-14 ENCOUNTER — Other Ambulatory Visit (HOSPITAL_COMMUNITY): Payer: Self-pay

## 2021-08-14 DIAGNOSIS — K219 Gastro-esophageal reflux disease without esophagitis: Secondary | ICD-10-CM

## 2021-08-14 DIAGNOSIS — E119 Type 2 diabetes mellitus without complications: Secondary | ICD-10-CM | POA: Diagnosis not present

## 2021-08-14 DIAGNOSIS — F332 Major depressive disorder, recurrent severe without psychotic features: Secondary | ICD-10-CM

## 2021-08-14 DIAGNOSIS — G8929 Other chronic pain: Secondary | ICD-10-CM

## 2021-08-14 DIAGNOSIS — I10 Essential (primary) hypertension: Secondary | ICD-10-CM

## 2021-08-14 DIAGNOSIS — F419 Anxiety disorder, unspecified: Secondary | ICD-10-CM

## 2021-08-14 NOTE — Progress Notes (Addendum)
Chronic Care Management Pharmacy Note  08/14/2021 Name:  [name] MRN:  [id number] DOB:  1951-11-04  Summary: - Patient feels that she is recovering as expecting and returning to baseline since most recent hospital discharge 08/11/2021 -Patient tearful through much of appointment due to her comorbidities, at times frustrated about certain things she is dealing with -Reports that she has been trying to establish with a psychiatrist but has been unsuccessful up to this point, is hoping to get an appointment next week -Reports that she is following with Dr. Nelva Bush for her pain control at this time, has been taking duloxetine for her fibromyalgia which is prescribed by PCP, reports that she does not feel medication has been helpful in treatment of her pain and states she does not feel it has any effect on her mood/ depression - possible in future wellbutrin can be decreased, but will start with duloxetine at this time and reevaluate in 2 months -Patient receiving dialysis Tuesdays, Thursdays, and Saturdays - has follow up in January to reevaluate possible transplant  -BP remains controlled, no recent lipid panel available for review at this time   Recommendations/Changes made from today's visit: -Recommending at this time for patient to wean off of duloxetine due to ineffectiveness and concern about use in dialysis - recommending for patient to take duloxetine 37m daily x 2 weeks, then reduce to duloxetine 328mevery other day for 2 weeks, then stop -Patient to establish with psychiatrist next week  - patient to reach out if she is unable to establish  -Recommend updated lipid panel with next PCP appointment    Subjective: Anita BONINEs an [age].o. year old female who is a primary patient of Plotnikov, AlEvie LacksMD.  The CCM team was consulted for assistance with disease management and care coordination needs.    Engaged with patient by telephone for initial visit in response to provider  referral for pharmacy case management and/or care coordination services.   Consent to Services:  The patient was given the following information about Chronic Care Management services today, agreed to services, and gave verbal consent: 1. CCM service includes personalized support from designated clinical staff supervised by the primary care provider, including individualized plan of care and coordination with other care providers 2. 24/7 contact phone numbers for assistance for urgent and routine care needs. 3. Service will only be billed when office clinical staff spend 20 minutes or more in a month to coordinate care. 4. Only one practitioner may furnish and bill the service in a calendar month. 5.The patient may stop CCM services at any time (effective at the end of the month) by phone call to the office staff. 6. The patient will be responsible for cost sharing (co-pay) of up to 20% of the service fee (after annual deductible is met). Patient agreed to services and consent obtained.  Patient Care Team: Plotnikov, AlEvie LacksMD as PCP - General KePedro EarlsMD as Attending Physician (Sports Medicine) GoCorliss ParishMD as Consulting Physician (Nephrology) [facility] Sa[facility]exene AgentMD as Attending Physician (Nephrology) GrRoseanne KaufmanMD as Consulting Physician (Orthopedic Surgery) SuJustice BritainMD as Consulting Physician (Orthopedic Surgery) SzTomasa BlaseRPCleveland [facility] Pharmacist (Pharmacist)  Recent office visits:  08/05/21 ThScarlette CalicoD - Seen for abdominal pain - Labs ordered - Discontinued cephALEXin - Advised to go to ED - follow up in 3 days 06/17/21 AlEvie LacksPlotnikov MD - Follow up visit - No medication changes  noted - Return in 6 months    Recent consult visits:  04/11/21 - [name] - Nephrology - No notes available  03/18/21 [name] - Hand surgery - No notes navailable  03/11/21 [name] - Nephrology - No notes available  03/04/21 Hulen Shouts  Rhyne PA-C - Vascular Surgery - Seen for evaluation of her hemodialysis - No med changes and no follow ups noted. 02/20/21 [place] No notes available    Hospital visits:  Medication Reconciliation was completed by comparing discharge summary, patient's EMR and Pharmacy list, and upon discussion with patient.   Admitted to the hospital on 08/05/21 due to Sepsis Discharge date was 08/11/21. Discharged from [place]?Medications Started at [facility] Discharge:?? APAP 596m - 2 tablets every 6 hours as needed  Augmentin 500-1296m- 1 tablet at bedtime for 5 days    Medication Changes at Hospital Discharge: None noted   Medications Discontinued at Hospital Discharge: None noted   Medications that remain the same after Hospital Discharge:??  -All other medications will remain the same.  Objective:  Lab Results  Component Value Date   CREATININE 5.49 (H) 08/11/2021   BUN 53 (H) 08/11/2021   GFR 9.48 (LL) 08/05/2021   GFRNONAA 8 (L) 08/11/2021   GFRAA 66 (L) 03/07/2015   NA 134 (L) 08/11/2021   K 3.5 08/11/2021   CALCIUM 9.0 08/11/2021   CO2 24 08/11/2021   GLUCOSE 121 (H) 08/11/2021    Lab Results  Component Value Date/Time   HGBA1C 6.0 12/15/2020 11:53 AM   HGBA1C 6.1 07/28/2020 04:32 PM   GFR 9.48 (LL) 08/05/2021 03:57 PM   GFR 20.20 (L) 12/15/2020 11:53 AM    Last diabetic Eye exam:  Lab Results  Component Value Date/Time   HMDIABEYEEXA No Retinopathy 04/13/2015 12:00 AM    Last diabetic Foot exam:  No results found for: HMDIABFOOTEX   Lab Results  Component Value Date   CHOL 215 (H) 09/25/2014   HDL 46.00 09/25/2014   LDLCALC 138 (H) 09/25/2014   TRIG 154.0 (H) 09/25/2014   CHOLHDL 5 09/25/2014    Hepatic Function Latest Ref Rng & Units 08/11/2021 08/10/2021 08/09/2021  Total Protein 6.5 - 8.1 g/dL 5.5(L) 5.5(L) 5.7(L)  Albumin 3.5 - 5.0 g/dL 2.3(L) 2.3(L) 2.4(L)  AST 15 - 41 U/L 33 20 16  ALT 0 - 44  U/L _0 Alk Phosphatase 38 - 126 U/L 118 110 121  Total Bilirubin 0.3 - 1.2 mg/dL 0.8 0.8 0.7  Bilirubin, Direct 0.0 - 0.3 mg/dL - - -    Lab Results  Component Value Date/Time   TSH 3.98 04/16/2020 03:13 PM   TSH 1.64 07/25/2013 04:31 PM    CBC Latest Ref Rng & Units 08/11/2021 08/10/2021 08/09/2021  WBC 4.0 - 10.5 K/uL 9.9 8.5 9.9  Hemoglobin 12.0 - 15.0 g/dL 8.7(L) 9.0(L) 8.9(L)  Hematocrit 36.0 - 46.0 % 26.7(L) 28.1(L) 27.4(L)  Platelets 150 - 400 K/uL 191 175 179    No results found for: VD25OH  Clinical ASCVD: No  The ASCVD Risk score (GMikey BussingC Jr., et al., 2013) failed to calculate for the following reasons:   Cannot find a previous HDL lab   Cannot find a previous total cholesterol lab    [facility] 08/14/2021 12/10/2020 07/28/2020  Decreased Interest 1 0 0  Down, Depressed, Hopeless 3 0 0  PHQ - 2 Score 4 0 0  Altered sleeping 3 - 0  Tired, decreased energy 2 - 0  Change in appetite 1 - 0  Feeling bad or failure about yourself  - - 0  Trouble concentrating 2 - 0  Moving slowly or fidgety/restless 0 - 0  Suicidal thoughts 0 - 0  PHQ-9 Score 12 - 0  Difficult doing work/chores - - Not difficult at all  Some recent data might be hidden    Social History   Tobacco Use  Smoking Status Former   Packs/day: 1.00   Years: 20.00   Pack years: 20.00   Types: Cigarettes   Quit date: 12/13/1988   Years since quitting: 32.6  Smokeless Tobacco Never   BP Readings from Last 3 Encounters:  08/11/21 (!) 144/64  08/05/21 (!) 112/58  06/17/21 126/62   Pulse Readings from Last 3 Encounters:  08/11/21 80  08/05/21 95  06/17/21 86   Wt Readings from Last 3 Encounters:  08/11/21 216 lb 0.8 oz (98 kg)  08/05/21 217 lb 3.2 oz (98.5 kg)  06/17/21 214 lb (97.1 kg)   BMI Readings from Last 3 Encounters:  08/11/21 39.52 kg/m  08/05/21 39.73 kg/m  06/17/21 39.14 kg/m    Assessment/Interventions: Review of patient past medical history, allergies,  medications, health status, including review of consultants reports, laboratory and other test data, was performed as part of comprehensive evaluation and provision of chronic care management services.   SDOH:  (Social Determinants of Health) assessments and interventions performed: Yes SDOH Interventions    Flowsheet Row Most Recent Value  SDOH Interventions   Depression Interventions/Treatment  Medication, Currently on Treatment, Referral to Psychiatry      SDOH Screenings   Alcohol Screen: Low Risk    Last Alcohol Screening Score (AUDIT): 0  Depression (PHQ2-9): Medium Risk   PHQ-2 Score: 12  Financial Resource Strain: Low Risk    Difficulty of Paying Living Expenses: Not hard at all  Food Insecurity: No Food Insecurity   Worried About [name] fundraiser in the Last Year: Never true   Ran Out of Food in the Last Year: Never true  Housing: Low Risk    Last Housing Risk Score: 0  Physical Activity: Inactive   Days of Exercise per Week: 0 days   Minutes of Exercise per Session: 0 min  Social Connections: Unknown   Frequency of Communication with Friends and Family: More than three times a week   Frequency of Social Gatherings with Friends and Family: More than three times a week   Attends Religious Services: Patient refused   Marine scientist or Organizations: Patient refused   Attends Music therapist: Patient refused   Marital Status: Never married  Stress: No Stress Concern Present   Feeling of Stress : Not at all  Tobacco Use: Medium Risk   Smoking Tobacco Use: Former   Smokeless Tobacco Use: Never  Transportation Needs: No Data processing manager (Medical): No   Lack of Transportation (Non-Medical): No    CCM Care Plan  Allergies  Allergen Reactions   Erythromycin Diarrhea and Nausea And Vomiting   Gabapentin Other (See Comments)    Confusion and falling     Medications Reviewed Today     Reviewed by Tomasa Blase, [facility] (Pharmacist) on 08/14/21 at [place] List Status: <None>   Medication Order Taking? Sig Documenting Provider Last Dose Status Informant  acetaminophen (TYLENOL) 500 MG tablet [id number] Yes Take 1,000 mg by mouth  2 (two) times daily as needed for moderate pain or headache. [provider] Taking Active Self  amoxicillin (AMOXIL) 500 MG capsule [id number] No TAKE 4 CAPSULES BY MOUTH 1  HOUR PRIOR TO DENTAL  PROCEDURE/CLEANING  Patient not taking: Reported on 08/14/2021   Plotnikov, Evie Lacks, MD Not Taking Active Self  amoxicillin-clavulanate (AUGMENTIN) 500-125 MG tablet [id number] Yes Take 1 tablet (500 mg total) by mouth at bedtime for 5 days. Elodia Florence., MD Taking Active   aspirin-acetaminophen-caffeine [facility] MIGRAINE) [phone] MG tablet [id number] Yes Take 1 tablet by mouth daily as needed for headache or migraine. Takes no more than 4/week  per nephro [provider] Taking Active   B Complex-C-Folic Acid (RENA-VITE PO) [id number] Yes Take 1 tablet a day [provider] Taking Active Self  blood glucose meter kit and supplies [id number] Yes Dispense based on patient and insurance preference. Used to check blood sugar daily, DX: E11.9 OneTouch Plotnikov, Evie Lacks, MD Taking Active Self  buPROPion (WELLBUTRIN XL) 300 MG 24 hr tablet [id number] Yes Take 1 tablet (300 mg total) by mouth at bedtime. Plotnikov, Evie Lacks, MD Taking Active Self  cyclobenzaprine (FLEXERIL) 10 MG tablet [id number] Yes Take 1 tablet (10 mg total) by mouth 3 (three) times daily as needed for muscle spasms.  Patient taking differently: Take 10 mg by mouth daily as needed for muscle spasms.   Plotnikov, Evie Lacks, MD Taking Active Self  dicyclomine (BENTYL) 20 MG tablet [id number] Yes Take one tablet every 4-6 hours as needed for abdominal pain  Patient taking differently: Take 20 mg by mouth every 4 (four) hours as needed (abdominal pain).   Irene Shipper, MD Taking Active    docusate sodium (COLACE) 100 MG capsule [id number] Yes Take 100 mg by mouth 2 (two) times daily. [provider] Taking Active   DULoxetine (CYMBALTA) 60 MG capsule [id number] Yes Take 1 capsule (60 mg total) by mouth at bedtime. Plotnikov, Evie Lacks, MD Taking Active Self  glimepiride (AMARYL) 1 MG tablet [id number] Yes Take 1 tablet (1 mg total) by mouth daily before breakfast. Plotnikov, Evie Lacks, MD Taking Active Self  glucose blood (TRUETEST TEST) test strip [id number] Yes Used to check blood sugar daily, DX: E11.9 Plotnikov, Evie Lacks, MD Taking Active Self  ketoconazole (NIZORAL) 2 % cream [id number] No APPLY TO THE AFFECTED AREA(S) BY TOPICAL ROUTE ONCE DAILY  Patient not taking: Reported on 08/14/2021    Not Taking Active Self  Lancets (ONETOUCH ULTRASOFT) lancets [id number] Yes Used to check blood sugar daily, DX: E11.9 Plotnikov, Evie Lacks, MD Taking Active Self  lidocaine-prilocaine (EMLA) cream [id number] Yes SMARTSIG:Sparingly Topical As Directed [provider] Taking Active Self  loratadine (CLARITIN) 10 MG tablet [id number] Yes Take 10 mg by mouth daily as needed for allergies. [provider] Taking Active   LORazepam (ATIVAN) 2 MG tablet [id number] Yes TAKE 1 TABLET BY MOUTH ONCE EVERY NIGHT AT BEDTIME AS NEEDED INSOMNIA  Patient taking differently: Take 1-2 mg by mouth at bedtime as needed.   Plotnikov, Evie Lacks, MD Taking Active Self  multivitamin-lutein [facility]) CAPS capsule [id number] Yes Take 1 capsule by mouth daily. [provider] Taking Active   oxyCODONE (ROXICODONE) 15 MG immediate release tablet [id number] Yes Take 1 tablet (15 mg total) by mouth 4 (four) times daily as needed.  Patient taking differently: Take 15 mg by mouth 4 (four) times daily as needed for pain.    Taking Active   pantoprazole (PROTONIX) 40 MG  tablet [id number] Yes Take 1 tablet (40 mg total) by mouth daily. Plotnikov, Evie Lacks, MD Taking Active Self  pilocarpine  (SALAGEN) 5 MG tablet [id number] Yes Take 1 tablet (5 mg total) by mouth 2 (two) times daily. Plotnikov, Evie Lacks, MD Taking Active Self           Med Note Reather Littler   Wed Aug 05, 2021 11:36 PM) Dewaine Conger for dry mouth  pioglitazone (ACTOS) 15 MG tablet [id number] Yes Take 1 tablet (15 mg total) by mouth daily. Plotnikov, Evie Lacks, MD Taking Active Self  polyethylene glycol (MIRALAX / GLYCOLAX) 17 g packet [id number] No Take 17 g by mouth daily as needed (constipation.).  Patient not taking: Reported on 08/14/2021   [provider] Not Taking Active Self  sevelamer carbonate (RENVELA) 800 MG tablet [id number] Yes Take 1,600 mg by mouth 3 (three) times daily with meals. Take 2 tablets tid and 1 tablet if eating a snack [provider] Taking Active Self  Soft Lens Products (SENSITIVE EYES SALINE) SOLN [id number] Yes Place 1 drop into both eyes as needed. [provider] Taking Active   triamcinolone (NASACORT ALLERGY 24HR) 55 MCG/ACT AERO nasal inhaler [id number] Yes Place 2 sprays into the nose daily. [provider] Taking Active             Patient Active Problem List   Diagnosis Date Noted   Enteritis 08/06/2021   Generalized abdominal tenderness without rebound tenderness 08/05/2021   Fever in adult 08/05/2021   Sepsis ([place]) 08/05/2021   Wrist pain, chronic, left 12/15/2020   Allergic rhinitis 08/14/2020   Cough 08/14/2020   Memory problem 07/28/2020   Other osteoporosis without current pathological fracture 05/21/2020   Confusion and disorientation 04/16/2020   DDD (degenerative disc disease), cervical 01/14/2020   Left elbow pain 10/17/2019   Pain in joint of right hip 09/12/2019   Lumbar spondylosis with myelopathy 09/12/2019   Intertrigo 04/16/2019   Tendinitis of left wrist 01/24/2019   Chest wall contusion, left, initial encounter 04/24/2018   Full thickness rotator cuff tear 04/14/2018   Falls frequently 03/20/2018   Neck pain  02/08/2018   Pain in joint of right shoulder 01/20/2018   ESRD (end stage renal disease) on dialysis ([place]) 10/13/2017   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   OA (osteoarthritis) of knee 03/05/2015   Rib pain on left side 02/27/2015   URI, acute 09/17/2013   Swelling of limb-Bilateral leg Left > than right 07/11/2013   Numbness-Left foot 07/11/2013   Chronic venous insufficiency 04/19/2013   Left hand pain 02/09/2013   Right shoulder pain 09/12/2012   Edema 04/24/2012   Grief 11/10/2011   ABSCESS, TOOTH 02/03/2011   OSTEOPENIA 10/28/2010   Fever 08/06/2010   HYPERKALEMIA 07/15/2010   ARTHRALGIA 04/08/2010   Fatigue 04/08/2010   XEROSTOMIA 02/04/2010   ECZEMA 10/29/2009   TOBACCO USE, QUIT 10/29/2009   Anemia 06/18/2009   Disorder resulting from impaired renal function 06/18/2009   Diarrhea 06/18/2009   OPACITY, VITREOUS HUMOR 01/15/2009   COLITIS 10/16/2008   KNEE PAIN 04/10/2008   HYPERCHOLESTEROLEMIA 01/10/2008   Anxiety disorder 11/08/2007   Depression 11/08/2007   Osteoarthritis 11/08/2007   LOW BACK PAIN 11/08/2007   Diabetes mellitus type 2, controlled ([place]) 10/07/2007   Essential hypertension 10/07/2007   GERD 10/07/2007   MICROALBUMINURIA 10/07/2007    Immunization History  Administered Date(s) Administered   Fluad Quad(high Dose 65+) 08/29/2019, 10/27/2020   Hep A / Hep B 01/16/2019,  04/16/2019, 07/17/2019   Influenza Whole 08/20/2010, 08/14/2011   Influenza, High Dose Seasonal PF 09/19/2017, 10/12/2018, 08/29/2019   Influenza,inj,Quad PF,6+ Mos 09/19/2015, 08/20/2016   Influenza-Unspecified 09/12/2013, 09/16/2014, 10/27/2020   PFIZER Comirnaty(Gray Top)Covid-19 Tri-Sucrose Vaccine 03/22/2021   PFIZER(Purple Top)SARS-COV-2 Vaccination 01/18/2020, 02/08/2020, 10/27/2020   Pneumococcal Conjugate-13 03/20/2014   Pneumococcal Polysaccharide-23 02/04/2010   Td 07/15/2004   Tdap 08/20/2016   Zoster Recombinat (Shingrix) 10/23/2019, 04/29/2020    Zoster, Live 12/19/2013    Conditions to be addressed/monitored:  Diabetes, HTN, Chronic Kidney Disease, Depression, Anxiety, Osteoarthritis, Allergic Rhinitis, GERD, Xerostomia, OA, and Chronic Pain   Care Plan : CCM Care Plan  Updates made by Tomasa Blase, RPH since 08/14/2021 12:00 AM     Problem: Diabetes, HTN, Chronic Kidney Disease, Depression, Anxiety, Osteoarthritis, Allergic Rhinitis, GERD, Xerostomia, OA, and Chronic Pain   Priority: High  Onset Date: 08/14/2021     Long-Range Goal: Disease Management   Start Date: 08/14/2021  Expected End Date: 02/11/2022  This Visit's Progress: On track  Priority: High  Note:   Current Barriers:  Unable to independently monitor therapeutic efficacy Does not contact provider office for questions/concerns  Pharmacist Clinical Goal(s):  Patient will achieve adherence to monitoring guidelines and medication adherence to achieve therapeutic efficacy achieve control of depression / anxiety as evidenced by mood / frequency of anxiety attacks through collaboration with PharmD and provider.   Interventions: 1:1 collaboration with Plotnikov, Evie Lacks, MD regarding development and update of comprehensive plan of care as evidenced by provider attestation and co-signature Inter-disciplinary care team collaboration (see longitudinal plan of care) Comprehensive medication review performed; medication list updated in electronic medical record  Diabetes (A1c goal <7%) -Controlled Lab Results  Component Value Date   HGBA1C 6.0 12/15/2020  -Current medications: Glimepiride 35m - 1 tablet daily  Pioglitazone 161m- 1 tablet daily  -Medications previously tried: metformin, repaglinide   -Current home glucose readings fasting glucose: does not monitor at home  -Denies hypoglycemic/hyperglycemic symptoms -Current meal patterns:  breakfast: 2 scrambled eggs and 1 piece of toast, with diet soda   lunch: sandwich, cottage cheese, fruit,  dinner:  fish, baked chicken, low sodium meat, salad, vegetables  snacks: fruit, does not typically snack  drinks: diet soda -Current exercise: limited due to her chronic pain  -Educated on A1c and blood sugar goals; Complications of diabetes including kidney damage, retinal damage, and cardiovascular disease; Prevention and management of hypoglycemic episodes; -Counseled to check feet daily and get yearly eye exams -Counseled on diet and exercise extensively Recommended to continue current medication  Depression/Anxiety (Goal: Promotion of positive mood/ prevention of anxiety attacks) -Not ideally controlled -Current treatment: Bupropion XL 30044m 1 tablet daily  Lorazepam 2mg51m1 tablet daily as needed  Duloxetine 60mg38m capsule daily - reports that this is for her fibromyalgia, not for her depression  -Medications previously tried/failed: n/a -PHQ9: 12 -Educated on Benefits of medication for symptom control Benefits of cognitive-behavioral therapy with or without medication -Recommended for patient to wean off of duloxetine as it is not helpful in treatment of her depression / fibromyalgia  Chronic Kideny Disease (Goal: Prevention of disease progression) - Stable -Dialysis3 times weekly (Tuesday, Thursday, and Saturday) -Last eGFR: 8mL/m21m(08/11/2021) -Last CrCl: 10.6 mL/min (08/11/2021) -Last Mg and Phosphorous levels -WNL - (08/11/2021)  -Current treatment  Sevelamer Carbonate 800mg -48mablets 3 times daily and 1 tablet if eating a snack Emla cream - applied prior to dialysis  Rena Vite - 1  tablet daily  -Medications previously tried: n/a  -Counseled on diet and exercise extensively Recommended to continue current medication  GERD (Goal: Prevention of acid reflux/heartburn) -Controlled -Current treatment  Pantoprazole 46m - 1 tablet daily  Dicyclomine 247m1 tablet every 4 hours as needed  Miralax 17g daily as needed  -Medications previously tried: omeprazole, metamucil,  famotidine  -Counseled on diet and exercise extensively Recommended to continue current medication  Dry Mouth (Goal: Prevention of symptoms / dental damage) -Controlled -Reports that she was previously taking Cevimeline which kept condition better controlled, but insurance no longer covers -Current treatment  Pilocarpine 39m27m 1 tablet twice daily  Biotin Mouthwash - 1-2 times daily as needed  -Medications previously tried: cevimeline  -Recommended to continue current medication - Advised patient that medication could be switch from pilocarpine back to cevimeline if patient was willing to pay cash price, voiced understanding    Chronic Pain/ OA/ DDD/ Chronic low back pain (Goal: Pain control) - Managed by Dr. RamNelva BushNot ideally controlled -Current treatment  Oxycodone 139m66m1 tablet 4 times daily if needed  Cyclobenzaprine 10mg59m tablet 3 times daily if needed  Acetaminophen 500mg 36mtablet every 6 hours as needed  Duloxetine 60mg -49mapsule daily  -Medications previously tried: celecoxib, ibuprofen, oxycontin, xtampza,  gabapentin, lyrica  -Recommended for patient to slowly wean off of duloxetine due to ineffectiveness and concern with use in dialysis  -Reviewed with patient benefits and risks of using opioids in conjunction with benzodiazepines and muscle relaxers, patient voiced understanding   Hypertension (BP goal <140/90) -Controlled through hemodialysis  -Current treatment: N/a -Medications previously tried: carvedilol, furosemide, torsemide   -Current home readings: does not check at home at this time BP Readings from Last 3 Encounters:  08/11/21 (!) 144/64  08/05/21 (!) 112/58  06/17/21 126/62  -Current dietary habits: reports to very close watch on diet, limits sodium, magnesium, phosphorus intake -Current exercise habits: limited at this time due to her chronic pain -Denies hypotensive/hypertensive symptoms -Educated on BP goals and benefits of medications for  prevention of heart attack, stroke and kidney damage; Daily salt intake goal < 2300 mg; -Recommended to continue current medication  Allergic Rhinitis (Goal: Prevention of allergy attacks) -Controlled -Current treatment  Loratadine 10mg - 139mblet daily  Triamcinolone  539mcg/ac63m2 puffs in each nostril daily  -Medications previously tried: allegra   -Recommended to continue current medication  Health Maintenance -Vaccine gaps: Influenza vaccine  -Current therapy:  Saline ophthalmic solution - 1 drop into both eyes daily as needed  Ocuvite - 1 tablet daily  Amoxicillin 500mg - 4 39mules prior to dental procedure   Excedrin - 1 tablet daily as needed for migraines - no more than 4 per week per nephro -Educated on Cost vs benefit of each product must be carefully weighed by individual consumer -Patient is satisfied with current therapy and denies issues -Recommended to continue current treatment plan  Patient Goals/Self-Care Activities Patient will:  - take medications as prescribed  Follow Up Plan: Telephone follow up appointment with care management team member scheduled for: 2 months  The patient has been provided with contact information for the care management team and has been advised to call with any health related questions or concerns.        Medication Assistance: None required.  Patient affirms current coverage meets needs.  Patient's preferred pharmacy is:  [facility] [place]  [address] [place] [address] Phone: [phone] Fax: [facility] [address] [place] [address] Phone: [phone] Fax: [place] [address] Phone: [phone] Fax: [phone]  [facility] [place] [place] [address] Phone: [phone] Fax: [phone]   Uses pill box? No - able to manage without Pt endorses 100% compliance  Care Plan and Follow Up Patient Decision:  Patient agrees to Care Plan and Follow-up.  Plan: Telephone follow up appointment with care management team member scheduled for:  2 months   Tomasa Blase, PharmD Clinical Pharmacist, [place] screening examination/treatment/procedure(s) were performed by non-physician practitioner and as supervising physician I was immediately available for consultation/collaboration.  I agree with above. Lew Dawes, MD

## 2021-08-14 NOTE — Patient Instructions (Signed)
Visit Information   PATIENT GOALS:   Goals Addressed             This Visit's Progress    Begin and Stick with Counseling-Depression       Timeframe:  Long-Range Goal Priority:  High Start Date:  08/14/2021                           Expected End Date: 02/11/2022                      Follow Up Date 10/14/2021   - check out counseling - keep 90 percent of counseling appointments - schedule counseling appointment  -reach out to clinic if you are having issues establishing with a psychiatrist    Why is this important?   Beating depression may take some time.  If you don't feel better right away, don't give up on your treatment plan.       Cope with Chronic Pain       Timeframe:  Long-Range Goal Priority:  High Start Date:  08/14/2021                          Expected End Date:  02/11/2022                     Follow Up Date 10/14/2021   - attend at least 90 percent of counseling appointments - begin personal counseling - learn relaxation techniques - practice acceptance of chronic pain - spend time with positive people - tell myself I can (not I can't) - think of new ways to do favorite things - use distraction techniques - use relaxation during pain    Why is this important?   Stress makes chronic pain feel worse.  Feelings like depression, anxiety, stress and anger can make your body more sensitive to pain.  Learning ways to cope with stress or depression may help you find some relief from the pain.          Consent to CCM Services: [name] was given information about Chronic Care Management services including:  CCM service includes personalized support from designated clinical staff supervised by her physician, including individualized plan of care and coordination with other care providers 24/7 contact phone numbers for assistance for urgent and routine care needs. Service will only be billed when office clinical staff spend 20 minutes or more in a month to  coordinate care. Only one practitioner may furnish and bill the service in a calendar month. The patient may stop CCM services at any time (effective at the end of the month) by phone call to the office staff. The patient will be responsible for cost sharing (co-pay) of up to 20% of the service fee (after annual deductible is met).  Patient agreed to services and verbal consent obtained.   The patient verbalized understanding of instructions, educational materials, and care plan provided today and declined offer to receive copy of patient instructions, educational materials, and care plan.   Telephone follow up appointment with care management team member scheduled for: 2 months The patient has been provided with contact information for the care management team and has been advised to call with any health related questions or concerns.   Tomasa Blase, PharmD Clinical Pharmacist, [place]    CLINICAL CARE PLAN: Patient Care Plan: CCM Care Plan     Problem  Identified: Diabetes, HTN, Chronic Kidney Disease, Depression, Anxiety, Osteoarthritis, Allergic Rhinitis, GERD, Xerostomia, OA, and Chronic Pain   Priority: High  Onset Date: 08/14/2021     Long-Range Goal: Disease Management   Start Date: 08/14/2021  Expected End Date: 02/11/2022  This Visit's Progress: On track  Priority: High  Note:   Current Barriers:  Unable to independently monitor therapeutic efficacy Does not contact provider office for questions/concerns  Pharmacist Clinical Goal(s):  Patient will achieve adherence to monitoring guidelines and medication adherence to achieve therapeutic efficacy achieve control of depression / anxiety as evidenced by mood / frequency of anxiety attacks through collaboration with PharmD and provider.   Interventions: 1:1 collaboration with Plotnikov, Evie Lacks, MD regarding development and update of comprehensive plan of care as evidenced by provider attestation and  co-signature Inter-disciplinary care team collaboration (see longitudinal plan of care) Comprehensive medication review performed; medication list updated in electronic medical record  Diabetes (A1c goal <7%) -Controlled Lab Results  Component Value Date   HGBA1C 6.0 12/15/2020  -Current medications: Glimepiride 1mg  - 1 tablet daily  Pioglitazone 15mg  - 1 tablet daily  -Medications previously tried: metformin, repaglinide   -Current home glucose readings fasting glucose: does not monitor at home  -Denies hypoglycemic/hyperglycemic symptoms -Current meal patterns:  breakfast: 2 scrambled eggs and 1 piece of toast, with diet soda   lunch: sandwich, cottage cheese, fruit,  dinner: fish, baked chicken, low sodium meat, salad, vegetables  snacks: fruit, does not typically snack  drinks: diet soda -Current exercise: limited due to her chronic pain  -Educated on A1c and blood sugar goals; Complications of diabetes including kidney damage, retinal damage, and cardiovascular disease; Prevention and management of hypoglycemic episodes; -Counseled to check feet daily and get yearly eye exams -Counseled on diet and exercise extensively Recommended to continue current medication  Depression/Anxiety (Goal: Promotion of positive mood/ prevention of anxiety attacks) -Not ideally controlled -Current treatment: Bupropion XL 300mg  - 1 tablet daily  Lorazepam 2mg  - 1 tablet daily as needed  Duloxetine 60mg  - 1 capsule daily - reports that this is for her fibromyalgia, not for her depression  -Medications previously tried/failed: n/a -PHQ9: 12 -Educated on Benefits of medication for symptom control Benefits of cognitive-behavioral therapy with or without medication -Recommended for patient to wean off of duloxetine as it is not helpful in treatment of her depression / fibromyalgia  Chronic Kideny Disease (Goal: Prevention of disease progression) - Stable -Dialysis3 times weekly (Tuesday,  Thursday, and Saturday) -Last eGFR: 53mL/min (08/11/2021) -Last CrCl: 10.6 mL/min (08/11/2021) -Last Mg and Phosphorous levels -WNL - (08/11/2021)  -Current treatment  Sevelamer Carbonate 800mg  - 2 tablets 3 times daily and 1 tablet if eating a snack Emla cream - applied prior to dialysis  Rena Vite - 1 tablet daily  -Medications previously tried: n/a  -Counseled on diet and exercise extensively Recommended to continue current medication  GERD (Goal: Prevention of acid reflux/heartburn) -Controlled -Current treatment  Pantoprazole 40mg  - 1 tablet daily  Dicyclomine 20mg  1 tablet every 4 hours as needed  Miralax 17g daily as needed  -Medications previously tried: omeprazole, metamucil, famotidine  -Counseled on diet and exercise extensively Recommended to continue current medication  Dry Mouth (Goal: Prevention of symptoms / dental damage) -Controlled -Reports that she was previously taking Cevimeline which kept condition better controlled, but insurance no longer covers -Current treatment  Pilocarpine 5mg  - 1 tablet twice daily  Biotin Mouthwash - 1-2 times daily as needed  -Medications previously tried: cevimeline  -Recommended  to continue current medication - Advised patient that medication could be switch from pilocarpine back to cevimeline if patient was willing to pay cash price, voiced understanding    Chronic Pain/ OA/ DDD/ Chronic low back pain (Goal: Pain control) - Managed by Dr. Nelva Bush  -Not ideally controlled -Current treatment  Oxycodone $RemoveBe'15mg'HEjzjahpe$  - 1 tablet 4 times daily if needed  Cyclobenzaprine $RemoveBeforeDE'10mg'RIZEOlhtFdhRrWB$  - 1 tablet 3 times daily if needed  Acetaminophen $RemoveBefore'500mg'NoACQPFwtOQpu$  - 2 tablet every 6 hours as needed  Duloxetine $RemoveBef'60mg'xBTkEyfSWB$  - 1 capsule daily  -Medications previously tried: celecoxib, ibuprofen, oxycontin, xtampza,  gabapentin, lyrica  -Recommended for patient to slowly wean off of duloxetine due to ineffectiveness and concern with use in dialysis  -Reviewed with patient benefits and  risks of using opioids in conjunction with benzodiazepines and muscle relaxers, patient voiced understanding   Hypertension (BP goal <140/90) -Controlled through hemodialysis  -Current treatment: N/a -Medications previously tried: carvedilol, furosemide, torsemide   -Current home readings: does not check at home at this time BP Readings from Last 3 Encounters:  08/11/21 (!) 144/64  08/05/21 (!) 112/58  06/17/21 126/62  -Current dietary habits: reports to very close watch on diet, limits sodium, magnesium, phosphorus intake -Current exercise habits: limited at this time due to her chronic pain -Denies hypotensive/hypertensive symptoms -Educated on BP goals and benefits of medications for prevention of heart attack, stroke and kidney damage; Daily salt intake goal < 2300 mg; -Recommended to continue current medication  Allergic Rhinitis (Goal: Prevention of allergy attacks) -Controlled -Current treatment  Loratadine $RemoveBef'10mg'dfmAombxke$  - 1 tablet daily  Triamcinolone  58mcg/act - 2 puffs in each nostril daily  -Medications previously tried: allegra   -Recommended to continue current medication  Health Maintenance -Vaccine gaps: Influenza vaccine  -Current therapy:  Saline ophthalmic solution - 1 drop into both eyes daily as needed  Ocuvite - 1 tablet daily  Amoxicillin $RemoveBefo'500mg'mPxODuKDIWI$  - 4 capsules prior to dental procedure   Excedrin - 1 tablet daily as needed for migraines - no more than 4 per week per nephro -Educated on Cost vs benefit of each product must be carefully weighed by individual consumer -Patient is satisfied with current therapy and denies issues -Recommended to continue current treatment plan  Patient Goals/Self-Care Activities Patient will:  - take medications as prescribed  Follow Up Plan: Telephone follow up appointment with care management team member scheduled for: 2 months  The patient has been provided with contact information for the care management team and has been advised  to call with any health related questions or concerns.

## 2021-08-15 DIAGNOSIS — D631 Anemia in chronic kidney disease: Secondary | ICD-10-CM | POA: Diagnosis not present

## 2021-08-15 DIAGNOSIS — N2581 Secondary hyperparathyroidism of renal origin: Secondary | ICD-10-CM | POA: Diagnosis not present

## 2021-08-15 DIAGNOSIS — N186 End stage renal disease: Secondary | ICD-10-CM | POA: Diagnosis not present

## 2021-08-15 DIAGNOSIS — Z992 Dependence on renal dialysis: Secondary | ICD-10-CM | POA: Diagnosis not present

## 2021-08-15 DIAGNOSIS — D688 Other specified coagulation defects: Secondary | ICD-10-CM | POA: Diagnosis not present

## 2021-08-15 DIAGNOSIS — G8929 Other chronic pain: Secondary | ICD-10-CM | POA: Diagnosis not present

## 2021-08-18 DIAGNOSIS — N2581 Secondary hyperparathyroidism of renal origin: Secondary | ICD-10-CM | POA: Diagnosis not present

## 2021-08-18 DIAGNOSIS — A419 Sepsis, unspecified organism: Secondary | ICD-10-CM | POA: Diagnosis not present

## 2021-08-18 DIAGNOSIS — D688 Other specified coagulation defects: Secondary | ICD-10-CM | POA: Diagnosis not present

## 2021-08-18 DIAGNOSIS — D631 Anemia in chronic kidney disease: Secondary | ICD-10-CM | POA: Diagnosis not present

## 2021-08-18 DIAGNOSIS — Z992 Dependence on renal dialysis: Secondary | ICD-10-CM | POA: Diagnosis not present

## 2021-08-18 DIAGNOSIS — G8929 Other chronic pain: Secondary | ICD-10-CM | POA: Diagnosis not present

## 2021-08-18 DIAGNOSIS — N186 End stage renal disease: Secondary | ICD-10-CM | POA: Diagnosis not present

## 2021-08-20 ENCOUNTER — Other Ambulatory Visit (HOSPITAL_COMMUNITY): Payer: Self-pay

## 2021-08-20 DIAGNOSIS — N2581 Secondary hyperparathyroidism of renal origin: Secondary | ICD-10-CM | POA: Diagnosis not present

## 2021-08-20 DIAGNOSIS — G8929 Other chronic pain: Secondary | ICD-10-CM | POA: Diagnosis not present

## 2021-08-20 DIAGNOSIS — N186 End stage renal disease: Secondary | ICD-10-CM | POA: Diagnosis not present

## 2021-08-20 DIAGNOSIS — Z992 Dependence on renal dialysis: Secondary | ICD-10-CM | POA: Diagnosis not present

## 2021-08-20 DIAGNOSIS — D631 Anemia in chronic kidney disease: Secondary | ICD-10-CM | POA: Diagnosis not present

## 2021-08-20 DIAGNOSIS — D688 Other specified coagulation defects: Secondary | ICD-10-CM | POA: Diagnosis not present

## 2021-08-20 MED ORDER — MIDODRINE HCL 5 MG PO TABS
ORAL_TABLET | ORAL | 3 refills | Status: DC
  Filled 2021-08-20: qty 90, 90d supply, fill #0

## 2021-08-22 DIAGNOSIS — D631 Anemia in chronic kidney disease: Secondary | ICD-10-CM | POA: Diagnosis not present

## 2021-08-22 DIAGNOSIS — Z992 Dependence on renal dialysis: Secondary | ICD-10-CM | POA: Diagnosis not present

## 2021-08-22 DIAGNOSIS — G8929 Other chronic pain: Secondary | ICD-10-CM | POA: Diagnosis not present

## 2021-08-22 DIAGNOSIS — D688 Other specified coagulation defects: Secondary | ICD-10-CM | POA: Diagnosis not present

## 2021-08-22 DIAGNOSIS — N2581 Secondary hyperparathyroidism of renal origin: Secondary | ICD-10-CM | POA: Diagnosis not present

## 2021-08-22 DIAGNOSIS — N186 End stage renal disease: Secondary | ICD-10-CM | POA: Diagnosis not present

## 2021-08-25 DIAGNOSIS — N2581 Secondary hyperparathyroidism of renal origin: Secondary | ICD-10-CM | POA: Diagnosis not present

## 2021-08-25 DIAGNOSIS — D688 Other specified coagulation defects: Secondary | ICD-10-CM | POA: Diagnosis not present

## 2021-08-25 DIAGNOSIS — Z992 Dependence on renal dialysis: Secondary | ICD-10-CM | POA: Diagnosis not present

## 2021-08-25 DIAGNOSIS — D631 Anemia in chronic kidney disease: Secondary | ICD-10-CM | POA: Diagnosis not present

## 2021-08-25 DIAGNOSIS — G8929 Other chronic pain: Secondary | ICD-10-CM | POA: Diagnosis not present

## 2021-08-25 DIAGNOSIS — N186 End stage renal disease: Secondary | ICD-10-CM | POA: Diagnosis not present

## 2021-08-27 DIAGNOSIS — N186 End stage renal disease: Secondary | ICD-10-CM | POA: Diagnosis not present

## 2021-08-27 DIAGNOSIS — G8929 Other chronic pain: Secondary | ICD-10-CM | POA: Diagnosis not present

## 2021-08-27 DIAGNOSIS — D688 Other specified coagulation defects: Secondary | ICD-10-CM | POA: Diagnosis not present

## 2021-08-27 DIAGNOSIS — N2581 Secondary hyperparathyroidism of renal origin: Secondary | ICD-10-CM | POA: Diagnosis not present

## 2021-08-27 DIAGNOSIS — Z992 Dependence on renal dialysis: Secondary | ICD-10-CM | POA: Diagnosis not present

## 2021-08-27 DIAGNOSIS — D631 Anemia in chronic kidney disease: Secondary | ICD-10-CM | POA: Diagnosis not present

## 2021-08-29 DIAGNOSIS — N2581 Secondary hyperparathyroidism of renal origin: Secondary | ICD-10-CM | POA: Diagnosis not present

## 2021-08-29 DIAGNOSIS — G8929 Other chronic pain: Secondary | ICD-10-CM | POA: Diagnosis not present

## 2021-08-29 DIAGNOSIS — N186 End stage renal disease: Secondary | ICD-10-CM | POA: Diagnosis not present

## 2021-08-29 DIAGNOSIS — M542 Cervicalgia: Secondary | ICD-10-CM | POA: Diagnosis not present

## 2021-08-29 DIAGNOSIS — Z992 Dependence on renal dialysis: Secondary | ICD-10-CM | POA: Diagnosis not present

## 2021-08-29 DIAGNOSIS — D688 Other specified coagulation defects: Secondary | ICD-10-CM | POA: Diagnosis not present

## 2021-08-29 DIAGNOSIS — D631 Anemia in chronic kidney disease: Secondary | ICD-10-CM | POA: Diagnosis not present

## 2021-08-31 ENCOUNTER — Other Ambulatory Visit: Payer: Self-pay | Admitting: Internal Medicine

## 2021-09-01 ENCOUNTER — Other Ambulatory Visit (HOSPITAL_COMMUNITY): Payer: Self-pay

## 2021-09-01 DIAGNOSIS — D688 Other specified coagulation defects: Secondary | ICD-10-CM | POA: Diagnosis not present

## 2021-09-01 DIAGNOSIS — D631 Anemia in chronic kidney disease: Secondary | ICD-10-CM | POA: Diagnosis not present

## 2021-09-01 DIAGNOSIS — N186 End stage renal disease: Secondary | ICD-10-CM | POA: Diagnosis not present

## 2021-09-01 DIAGNOSIS — Z992 Dependence on renal dialysis: Secondary | ICD-10-CM | POA: Diagnosis not present

## 2021-09-01 DIAGNOSIS — N2581 Secondary hyperparathyroidism of renal origin: Secondary | ICD-10-CM | POA: Diagnosis not present

## 2021-09-01 DIAGNOSIS — G8929 Other chronic pain: Secondary | ICD-10-CM | POA: Diagnosis not present

## 2021-09-01 MED ORDER — LORAZEPAM 2 MG PO TABS
2.0000 mg | ORAL_TABLET | Freq: Every evening | ORAL | 2 refills | Status: DC | PRN
  Filled 2021-09-01 – 2021-09-08 (×3): qty 90, 90d supply, fill #0

## 2021-09-03 ENCOUNTER — Other Ambulatory Visit (HOSPITAL_COMMUNITY): Payer: Self-pay

## 2021-09-03 DIAGNOSIS — N2581 Secondary hyperparathyroidism of renal origin: Secondary | ICD-10-CM | POA: Diagnosis not present

## 2021-09-03 DIAGNOSIS — G8929 Other chronic pain: Secondary | ICD-10-CM | POA: Diagnosis not present

## 2021-09-03 DIAGNOSIS — N186 End stage renal disease: Secondary | ICD-10-CM | POA: Diagnosis not present

## 2021-09-03 DIAGNOSIS — D631 Anemia in chronic kidney disease: Secondary | ICD-10-CM | POA: Diagnosis not present

## 2021-09-03 DIAGNOSIS — D688 Other specified coagulation defects: Secondary | ICD-10-CM | POA: Diagnosis not present

## 2021-09-03 DIAGNOSIS — Z992 Dependence on renal dialysis: Secondary | ICD-10-CM | POA: Diagnosis not present

## 2021-09-05 DIAGNOSIS — D688 Other specified coagulation defects: Secondary | ICD-10-CM | POA: Diagnosis not present

## 2021-09-05 DIAGNOSIS — G8929 Other chronic pain: Secondary | ICD-10-CM | POA: Diagnosis not present

## 2021-09-05 DIAGNOSIS — D631 Anemia in chronic kidney disease: Secondary | ICD-10-CM | POA: Diagnosis not present

## 2021-09-05 DIAGNOSIS — N186 End stage renal disease: Secondary | ICD-10-CM | POA: Diagnosis not present

## 2021-09-05 DIAGNOSIS — N2581 Secondary hyperparathyroidism of renal origin: Secondary | ICD-10-CM | POA: Diagnosis not present

## 2021-09-05 DIAGNOSIS — Z992 Dependence on renal dialysis: Secondary | ICD-10-CM | POA: Diagnosis not present

## 2021-09-08 ENCOUNTER — Other Ambulatory Visit (HOSPITAL_COMMUNITY): Payer: Self-pay

## 2021-09-08 DIAGNOSIS — N186 End stage renal disease: Secondary | ICD-10-CM | POA: Diagnosis not present

## 2021-09-08 DIAGNOSIS — D688 Other specified coagulation defects: Secondary | ICD-10-CM | POA: Diagnosis not present

## 2021-09-08 DIAGNOSIS — Z992 Dependence on renal dialysis: Secondary | ICD-10-CM | POA: Diagnosis not present

## 2021-09-08 DIAGNOSIS — G8929 Other chronic pain: Secondary | ICD-10-CM | POA: Diagnosis not present

## 2021-09-08 DIAGNOSIS — N2581 Secondary hyperparathyroidism of renal origin: Secondary | ICD-10-CM | POA: Diagnosis not present

## 2021-09-08 DIAGNOSIS — D631 Anemia in chronic kidney disease: Secondary | ICD-10-CM | POA: Diagnosis not present

## 2021-09-08 MED ORDER — OXYCODONE HCL 15 MG PO TABS
15.0000 mg | ORAL_TABLET | Freq: Four times a day (QID) | ORAL | 0 refills | Status: DC | PRN
  Filled 2021-09-08: qty 120, 30d supply, fill #0

## 2021-09-10 DIAGNOSIS — G8929 Other chronic pain: Secondary | ICD-10-CM | POA: Diagnosis not present

## 2021-09-10 DIAGNOSIS — Z992 Dependence on renal dialysis: Secondary | ICD-10-CM | POA: Diagnosis not present

## 2021-09-10 DIAGNOSIS — N186 End stage renal disease: Secondary | ICD-10-CM | POA: Diagnosis not present

## 2021-09-10 DIAGNOSIS — D688 Other specified coagulation defects: Secondary | ICD-10-CM | POA: Diagnosis not present

## 2021-09-10 DIAGNOSIS — D631 Anemia in chronic kidney disease: Secondary | ICD-10-CM | POA: Diagnosis not present

## 2021-09-10 DIAGNOSIS — N2581 Secondary hyperparathyroidism of renal origin: Secondary | ICD-10-CM | POA: Diagnosis not present

## 2021-09-11 DIAGNOSIS — Z992 Dependence on renal dialysis: Secondary | ICD-10-CM | POA: Diagnosis not present

## 2021-09-11 DIAGNOSIS — E1022 Type 1 diabetes mellitus with diabetic chronic kidney disease: Secondary | ICD-10-CM | POA: Diagnosis not present

## 2021-09-11 DIAGNOSIS — N186 End stage renal disease: Secondary | ICD-10-CM | POA: Diagnosis not present

## 2021-09-12 DIAGNOSIS — N2581 Secondary hyperparathyroidism of renal origin: Secondary | ICD-10-CM | POA: Diagnosis not present

## 2021-09-12 DIAGNOSIS — Z23 Encounter for immunization: Secondary | ICD-10-CM | POA: Diagnosis not present

## 2021-09-12 DIAGNOSIS — E1129 Type 2 diabetes mellitus with other diabetic kidney complication: Secondary | ICD-10-CM | POA: Diagnosis not present

## 2021-09-12 DIAGNOSIS — D688 Other specified coagulation defects: Secondary | ICD-10-CM | POA: Diagnosis not present

## 2021-09-12 DIAGNOSIS — G8929 Other chronic pain: Secondary | ICD-10-CM | POA: Diagnosis not present

## 2021-09-12 DIAGNOSIS — Z992 Dependence on renal dialysis: Secondary | ICD-10-CM | POA: Diagnosis not present

## 2021-09-12 DIAGNOSIS — N186 End stage renal disease: Secondary | ICD-10-CM | POA: Diagnosis not present

## 2021-09-14 ENCOUNTER — Encounter (HOSPITAL_COMMUNITY): Payer: Self-pay

## 2021-09-14 ENCOUNTER — Emergency Department (HOSPITAL_COMMUNITY)
Admission: EM | Admit: 2021-09-14 | Discharge: 2021-09-14 | Disposition: A | Discharge status: Home / Self Care | Payer: Medicare Other | Attending: Emergency Medicine | Admitting: Emergency Medicine

## 2021-09-14 ENCOUNTER — Encounter: Payer: Self-pay | Admitting: Nurse Practitioner

## 2021-09-14 ENCOUNTER — Emergency Department (HOSPITAL_COMMUNITY): Payer: Medicare Other

## 2021-09-14 ENCOUNTER — Ambulatory Visit (INDEPENDENT_AMBULATORY_CARE_PROVIDER_SITE_OTHER): Payer: Medicare Other | Admitting: Nurse Practitioner

## 2021-09-14 VITALS — BP 100/60 | HR 66 | Ht 62.0 in | Wt 215.0 lb

## 2021-09-14 DIAGNOSIS — Z7984 Long term (current) use of oral hypoglycemic drugs: Secondary | ICD-10-CM | POA: Insufficient documentation

## 2021-09-14 DIAGNOSIS — Z96651 Presence of right artificial knee joint: Secondary | ICD-10-CM | POA: Diagnosis not present

## 2021-09-14 DIAGNOSIS — W01198A Fall on same level from slipping, tripping and stumbling with subsequent striking against other object, initial encounter: Secondary | ICD-10-CM | POA: Insufficient documentation

## 2021-09-14 DIAGNOSIS — S2242XA Multiple fractures of ribs, left side, initial encounter for closed fracture: Secondary | ICD-10-CM | POA: Diagnosis not present

## 2021-09-14 DIAGNOSIS — E1122 Type 2 diabetes mellitus with diabetic chronic kidney disease: Secondary | ICD-10-CM | POA: Insufficient documentation

## 2021-09-14 DIAGNOSIS — Z992 Dependence on renal dialysis: Secondary | ICD-10-CM | POA: Insufficient documentation

## 2021-09-14 DIAGNOSIS — M545 Low back pain, unspecified: Secondary | ICD-10-CM | POA: Diagnosis not present

## 2021-09-14 DIAGNOSIS — Z87891 Personal history of nicotine dependence: Secondary | ICD-10-CM | POA: Insufficient documentation

## 2021-09-14 DIAGNOSIS — M47814 Spondylosis without myelopathy or radiculopathy, thoracic region: Secondary | ICD-10-CM | POA: Diagnosis not present

## 2021-09-14 DIAGNOSIS — R1010 Upper abdominal pain, unspecified: Secondary | ICD-10-CM

## 2021-09-14 DIAGNOSIS — I2510 Atherosclerotic heart disease of native coronary artery without angina pectoris: Secondary | ICD-10-CM | POA: Diagnosis not present

## 2021-09-14 DIAGNOSIS — Z79899 Other long term (current) drug therapy: Secondary | ICD-10-CM | POA: Insufficient documentation

## 2021-09-14 DIAGNOSIS — I120 Hypertensive chronic kidney disease with stage 5 chronic kidney disease or end stage renal disease: Secondary | ICD-10-CM | POA: Diagnosis not present

## 2021-09-14 DIAGNOSIS — M19011 Primary osteoarthritis, right shoulder: Secondary | ICD-10-CM | POA: Diagnosis not present

## 2021-09-14 DIAGNOSIS — R52 Pain, unspecified: Secondary | ICD-10-CM

## 2021-09-14 DIAGNOSIS — S0990XA Unspecified injury of head, initial encounter: Secondary | ICD-10-CM | POA: Diagnosis not present

## 2021-09-14 DIAGNOSIS — R519 Headache, unspecified: Secondary | ICD-10-CM | POA: Insufficient documentation

## 2021-09-14 DIAGNOSIS — S2232XA Fracture of one rib, left side, initial encounter for closed fracture: Secondary | ICD-10-CM | POA: Insufficient documentation

## 2021-09-14 DIAGNOSIS — M542 Cervicalgia: Secondary | ICD-10-CM | POA: Diagnosis not present

## 2021-09-14 DIAGNOSIS — S299XXA Unspecified injury of thorax, initial encounter: Secondary | ICD-10-CM | POA: Diagnosis present

## 2021-09-14 DIAGNOSIS — S301XXA Contusion of abdominal wall, initial encounter: Secondary | ICD-10-CM | POA: Insufficient documentation

## 2021-09-14 DIAGNOSIS — I6523 Occlusion and stenosis of bilateral carotid arteries: Secondary | ICD-10-CM | POA: Diagnosis not present

## 2021-09-14 DIAGNOSIS — M2578 Osteophyte, vertebrae: Secondary | ICD-10-CM | POA: Diagnosis not present

## 2021-09-14 DIAGNOSIS — K529 Noninfective gastroenteritis and colitis, unspecified: Secondary | ICD-10-CM

## 2021-09-14 DIAGNOSIS — S20229A Contusion of unspecified back wall of thorax, initial encounter: Secondary | ICD-10-CM | POA: Diagnosis not present

## 2021-09-14 DIAGNOSIS — R0781 Pleurodynia: Secondary | ICD-10-CM | POA: Diagnosis not present

## 2021-09-14 DIAGNOSIS — K429 Umbilical hernia without obstruction or gangrene: Secondary | ICD-10-CM | POA: Diagnosis not present

## 2021-09-14 DIAGNOSIS — K5730 Diverticulosis of large intestine without perforation or abscess without bleeding: Secondary | ICD-10-CM | POA: Diagnosis not present

## 2021-09-14 DIAGNOSIS — N186 End stage renal disease: Secondary | ICD-10-CM | POA: Insufficient documentation

## 2021-09-14 LAB — CBC WITH DIFFERENTIAL/PLATELET
Abs Immature Granulocytes: 0.04 10*3/uL (ref 0.00–0.07)
Basophils Absolute: 0.1 10*3/uL (ref 0.0–0.1)
Basophils Relative: 1 %
Eosinophils Absolute: 0.4 10*3/uL (ref 0.0–0.5)
Eosinophils Relative: 3 %
HCT: 40.1 % (ref 36.0–46.0)
Hemoglobin: 12.6 g/dL (ref 12.0–15.0)
Immature Granulocytes: 0 %
Lymphocytes Relative: 21 %
Lymphs Abs: 2.8 10*3/uL (ref 0.7–4.0)
MCH: 33.2 pg (ref 26.0–34.0)
MCHC: 31.4 g/dL (ref 30.0–36.0)
MCV: 105.5 fL — ABNORMAL HIGH (ref 80.0–100.0)
Monocytes Absolute: 1.1 10*3/uL — ABNORMAL HIGH (ref 0.1–1.0)
Monocytes Relative: 8 %
Neutro Abs: 9.1 10*3/uL — ABNORMAL HIGH (ref 1.7–7.7)
Neutrophils Relative %: 67 %
Platelets: 263 10*3/uL (ref 150–400)
RBC: 3.8 MIL/uL — ABNORMAL LOW (ref 3.87–5.11)
RDW: 14 % (ref 11.5–15.5)
WBC: 13.7 10*3/uL — ABNORMAL HIGH (ref 4.0–10.5)
nRBC: 0 % (ref 0.0–0.2)

## 2021-09-14 LAB — COMPREHENSIVE METABOLIC PANEL
ALT: 17 U/L (ref 0–44)
AST: 21 U/L (ref 15–41)
Albumin: 4.1 g/dL (ref 3.5–5.0)
Alkaline Phosphatase: 107 U/L (ref 38–126)
Anion gap: 20 — ABNORMAL HIGH (ref 5–15)
BUN: 58 mg/dL — ABNORMAL HIGH (ref 8–23)
CO2: 24 mmol/L (ref 22–32)
Calcium: 10.6 mg/dL — ABNORMAL HIGH (ref 8.9–10.3)
Chloride: 92 mmol/L — ABNORMAL LOW (ref 98–111)
Creatinine, Ser: 4.96 mg/dL — ABNORMAL HIGH (ref 0.44–1.00)
GFR, Estimated: 9 mL/min — ABNORMAL LOW (ref >60)
Glucose, Bld: 145 mg/dL — ABNORMAL HIGH (ref 70–99)
Potassium: 4.7 mmol/L (ref 3.5–5.1)
Sodium: 136 mmol/L (ref 135–145)
Total Bilirubin: 0.6 mg/dL (ref 0.3–1.2)
Total Protein: 8 g/dL (ref 6.5–8.1)

## 2021-09-14 MED ORDER — LIDOCAINE 5 % EX PTCH: 1.0000 | MEDICATED_PATCH | CUTANEOUS | Status: DC

## 2021-09-14 MED ORDER — CYCLOBENZAPRINE HCL 10 MG PO TABS: 5.0000 mg | ORAL_TABLET | Freq: Once | ORAL | Status: DC

## 2021-09-14 MED ORDER — FENTANYL CITRATE PF 50 MCG/ML IJ SOSY
50.0000 ug | PREFILLED_SYRINGE | Freq: Once | INTRAMUSCULAR | Status: AC
  Administered 2021-09-14: 50 ug via INTRAVENOUS
  Filled 2021-09-14: qty 1

## 2021-09-14 MED ORDER — IOHEXOL 350 MG/ML SOLN
80.0000 mL | Freq: Once | INTRAVENOUS | Status: AC | PRN
  Administered 2021-09-14: 80 mL via INTRAVENOUS

## 2021-09-14 NOTE — Discharge Instructions (Addendum)
You fractured your left ninth sided rib.  Continue your pain management at home with her chronic pain meds.  Use incentive spirometer every hour while you are awake to help with preventing any lung infection.  Please follow-up with your primary care doctor for any necessary changes in chronic pain management.  Otherwise no acute injuries.

## 2021-09-14 NOTE — Patient Instructions (Signed)
If you are age 69 or older, your body mass index should be between 23-30. Your Body mass index is 39.32 kg/m. If this is out of the aforementioned range listed, please consider follow up with your Primary Care Provider.  We have scheduled you a follow up with [name] on 11/17/21 at 9:20 am.  Continue Dicyclomine once or twice daily as needed.  Proceed to Emergency Department secondary to your fall at home today.  It was great seeing you today! Thank you for entrusting me with your care and choosing [facility].  Noralyn Pick, CRNP  The [place] [facility] providers would like to encourage you to use [facility] to communicate with providers for non-urgent requests or questions.  Due to long hold times on the telephone, sending your provider a message by [facility] may be faster and more efficient way to get a response. Please allow 48 business hours for a response.  Please remember that this is for non-urgent requests/questions.

## 2021-09-14 NOTE — ED Notes (Signed)
Pt upset that she has been here for over "6" hours and this is ridiculous that I am here still.  Pt explained the circumstance.

## 2021-09-14 NOTE — Progress Notes (Signed)
GI NP assessment noted

## 2021-09-14 NOTE — ED Provider Notes (Signed)
[facility] DEPT Provider Note   CSN: [id number] Arrival date & time: 09/14/21  1150     History Chief Complaint  Patient presents with   [name]    ASHLAN DIGNAN is a 70 y.o. female.  Mechanical fall this morning.  Hit the left side of her ribs and back and head.  States that when she has dialysis she gets heparin.  Did not have dialysis today.  Is due for dialysis tomorrow.  Has bruising to the back of her left side of ribs.  The history is provided by the patient.  Fall This is a new problem. The problem has been resolved. Associated symptoms include headaches. Pertinent negatives include no chest pain, no abdominal pain and no shortness of breath. Associated symptoms comments: Left flank, head . Nothing aggravates the symptoms. Nothing relieves the symptoms. She has tried nothing for the symptoms. The treatment provided no relief.      Past Medical History:  Diagnosis Date   Anemia    Anxiety    Brain tumor (benign) ([place])    Colitis 2010   microscopic- Dr Henrene Pastor   Depression    Diabetes mellitus    type II   Dyspnea    with exertion   ESRD (end stage renal disease) ([place])    TTUSAT [address]    Fibromyalgia    GERD (gastroesophageal reflux disease)    Headache    History of blood transfusion    after knee surgery   Hypertension    discontinued all diuretics and antihypertensives   IBS (irritable bowel syndrome)    LBP (low back pain)    Neuropathy    feet bilat    Osteoarthritis    Osteopenia    Pneumonia    hx of 2014    Rotator cuff tear, right    Sinusitis    currently being treated with antibiotic will complete 03/04/2015    Patient Active Problem List   Diagnosis Date Noted   Enteritis 08/06/2021   Generalized abdominal tenderness without rebound tenderness 08/05/2021   Fever in adult 08/05/2021   Sepsis ([place]) 08/05/2021   Wrist pain, chronic, left 12/15/2020   Allergic rhinitis 08/14/2020   Cough 08/14/2020    Memory problem 07/28/2020   Other osteoporosis without current pathological fracture 05/21/2020   Confusion and disorientation 04/16/2020   DDD (degenerative disc disease), cervical 01/14/2020   Left elbow pain 10/17/2019   Pain in joint of right hip 09/12/2019   Lumbar spondylosis with myelopathy 09/12/2019   Intertrigo 04/16/2019   Tendinitis of left wrist 01/24/2019   Chest wall contusion, left, initial encounter 04/24/2018   Full thickness rotator cuff tear 04/14/2018   Falls frequently 03/20/2018   Neck pain 02/08/2018   Pain in joint of right shoulder 01/20/2018   ESRD (end stage renal disease) on dialysis ([place]) 10/13/2017   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   OA (osteoarthritis) of knee 03/05/2015   Rib pain on left side 02/27/2015   URI, acute 09/17/2013   Swelling of limb-Bilateral leg Left > than right 07/11/2013   Numbness-Left foot 07/11/2013   Chronic venous insufficiency 04/19/2013   Left hand pain 02/09/2013   Right shoulder pain 09/12/2012   Edema 04/24/2012   Grief 11/10/2011   ABSCESS, TOOTH 02/03/2011   OSTEOPENIA 10/28/2010   Fever 08/06/2010   HYPERKALEMIA 07/15/2010   ARTHRALGIA 04/08/2010   Fatigue 04/08/2010   XEROSTOMIA 02/04/2010   ECZEMA 10/29/2009   TOBACCO USE, QUIT  10/29/2009   Anemia 06/18/2009   Disorder resulting from impaired renal function 06/18/2009   Diarrhea 06/18/2009   OPACITY, VITREOUS HUMOR 01/15/2009   COLITIS 10/16/2008   KNEE PAIN 04/10/2008   HYPERCHOLESTEROLEMIA 01/10/2008   Anxiety disorder 11/08/2007   Depression 11/08/2007   Osteoarthritis 11/08/2007   LOW BACK PAIN 11/08/2007   Diabetes mellitus type 2, controlled ([place]) 10/07/2007   Essential hypertension 10/07/2007   GERD 10/07/2007   MICROALBUMINURIA 10/07/2007    Past Surgical History:  Procedure Laterality Date   AV FISTULA PLACEMENT Right 09/12/2020   Procedure: RIGHT ARTERIOVENOUS (AV) FISTULA CREATION;  Surgeon: Elam Dutch, MD;   Location: [facility];  Service: Vascular;  Laterality: Right;   [place] Right 10/27/2020   Procedure: RIGHT UPPER EXTREMITY SECOND STAGE [place];  Surgeon: Elam Dutch, MD;  Location[facility];  Service: Vascular;  Laterality: Right;   foramen magnum ependymoma surgery  2003   Dr Rita Ohara   JOINT REPLACEMENT Bilateral    NASAL SINUS SURGERY     1973    TONSILLECTOMY     TOTAL KNEE ARTHROPLASTY     L 2008, R 2009, R 2016- Dr Maureen Ralphs   TOTAL KNEE REVISION Right 03/05/2015   Procedure: RIGHT TOTAL KNEE ARTHROPLASTY REVISION;  Surgeon: Gaynelle Arabian, MD;  Location: [facility];  Service: Orthopedics;  Laterality: Right;     OB History   No obstetric history on file.     Family History  Problem Relation Age of Onset   Depression Mother    Hypertension Mother    Stroke Mother 59   Diabetes Mother    Diabetes Father    Hyperlipidemia Father    Hypertension Father    Crohn's disease Maternal Uncle    Diabetes Other    Cancer Brother        Prostate   Diabetes Brother    Heart disease Brother    Hyperlipidemia Brother    Hypertension Brother    Diabetes Brother    Heart disease Brother        Heart Disease before age 54   Heart attack Brother    Stroke Brother        X's 2   Hyperlipidemia Brother    Hypertension Brother    Colon cancer Neg Hx     Social History   Tobacco Use   Smoking status: Former    Packs/day: 1.00    Years: 20.00    Pack years: 20.00    Types: Cigarettes    Quit date: 12/13/1988    Years since quitting: 32.7   Smokeless tobacco: Never  Vaping Use   Vaping Use: Never used  Substance Use Topics   Alcohol use: Not Currently    Alcohol/week: 0.0 standard drinks    Comment: rarely   Drug use: No    Home Medications Prior to Admission medications   Medication Sig Start Date End Date Taking? Authorizing Provider  acetaminophen (TYLENOL) 500 MG tablet Take 1,000 mg by mouth 2 (two) times daily as needed for  headache.   Yes [provider]  amoxicillin (AMOXIL) 500 MG capsule TAKE 4 CAPSULES BY MOUTH 1  HOUR PRIOR TO DENTAL  PROCEDURE/CLEANING Patient taking differently: Take 2,000 mg by mouth See admin instructions. Take 4 capsules (2000 mg) by mouth one hour prior to dental procedure/cleaning 04/16/20  Yes Plotnikov, Evie Lacks, MD  aspirin-acetaminophen-caffeine (EXCEDRIN MIGRAINE) [phone] MG tablet Take 1 tablet by mouth daily as needed for headache  or migraine. Max 3 doses in one week   Yes [provider]  B Complex-C-Folic Acid (RENA-VITE PO) Take 1 tablet by mouth every morning.   Yes [provider]  buPROPion (WELLBUTRIN XL) 300 MG 24 hr tablet Take 1 tablet (300 mg total) by mouth at bedtime. 10/29/20  Yes Plotnikov, Evie Lacks, MD  carboxymethylcellulose (REFRESH PLUS) 0.5 % SOLN Place 1 drop into both eyes 3 (three) times daily as needed (dry eyes/irritation).   Yes [provider]  cyclobenzaprine (FLEXERIL) 10 MG tablet Take 1 tablet (10 mg total) by mouth 3 (three) times daily as needed for muscle spasms. Patient taking differently: Take 30 mg by mouth at bedtime. 04/09/21  Yes Plotnikov, Evie Lacks, MD  dicyclomine (BENTYL) 20 MG tablet Take one tablet every 4-6 hours as needed for abdominal pain Patient taking differently: Take 20 mg by mouth every 4 (four) hours as needed (abdominal pain). 01/30/20  Yes Irene Shipper, MD  docusate sodium (COLACE) 100 MG capsule Take 100 mg by mouth 2 (two) times daily.   Yes [provider]  DULoxetine (CYMBALTA) 60 MG capsule Take 1 capsule (60 mg total) by mouth at bedtime. 12/15/20  Yes Plotnikov, Evie Lacks, MD  glimepiride (AMARYL) 1 MG tablet Take 1 tablet (1 mg total) by mouth daily before breakfast. 10/29/20  Yes Plotnikov, Evie Lacks, MD  ketoconazole (NIZORAL) 2 % cream APPLY TO THE AFFECTED AREA(S) BY TOPICAL ROUTE ONCE DAILY Patient taking differently: Apply 1 application topically daily as needed for  irritation (rash). 04/20/21  Yes   ketoconazole (NIZORAL) 200 MG tablet Take 100 mg by mouth See admin instructions. Take 1/2 tablet (100 mg) by mouth daily for 30 days - as needed for yeast infection   Yes [provider]  lidocaine-prilocaine (EMLA) cream Apply 1 application topically See admin instructions. Apply topically to port access one hour prior to dialysis, cover with plastic- Tuesday, Thursday, Saturday 12/10/20  Yes [provider]  loratadine (CLARITIN) 10 MG tablet Take 10 mg by mouth daily as needed for allergies.   Yes [provider]  LORazepam (ATIVAN) 2 MG tablet Take 1 tablet (2 mg total) by mouth at bedtime as needed for insomnia 09/01/21  Yes Plotnikov, Evie Lacks, MD  midodrine (PROAMATINE) 5 MG tablet Take 1-2 tablet by mouth three times a week as directed Take 30 minutes before dialysis and another tablet during middle of dialysis treatment Patient taking differently: Take 5 mg by mouth See admin instructions. Take one tablet (5 mg) by mouth 30 minutes before dialysis (Tuesday, Thursday, Saturday); take one tablet (5 mg) during dialysis as needed for SBP <80 08/20/21  Yes   multivitamin-lutein (OCUVITE-LUTEIN) CAPS capsule Take 1 capsule by mouth every morning.   Yes [provider]  oxyCODONE (ROXICODONE) 15 MG immediate release tablet Take 1 tablet (15 mg total) by mouth 4 (four) times daily as needed. Patient taking differently: Take 15 mg by mouth 4 (four) times daily as needed for pain. 09/08/21  Yes   pantoprazole (PROTONIX) 40 MG tablet Take 1 tablet (40 mg total) by mouth daily. 12/15/20  Yes Plotnikov, Evie Lacks, MD  pilocarpine (SALAGEN) 5 MG tablet Take 1 tablet (5 mg total) by mouth 2 (two) times daily. Patient taking differently: Take 5 mg by mouth 2 (two) times daily. For dry mouth 10/29/20  Yes Plotnikov, Evie Lacks, MD  pioglitazone (ACTOS) 15 MG tablet Take 1 tablet (15 mg total) by mouth daily. 10/29/20  Yes Plotnikov,  Evie Lacks, MD   polyethylene glycol (MIRALAX / GLYCOLAX) 17 g packet Take 17 g by mouth See admin instructions. Mix 17 g powder in 1/2 cup liquid and drink by mouth on Tuesday and Saturday evenings   Yes [provider]  sevelamer carbonate (RENVELA) 800 MG tablet Take 800-1,600 mg by mouth See admin instructions. Take 2 tablets (1600 mg) by mouth three times daily with meals, take 1 tablet (800 mg) with snacks 11/07/20  Yes [provider]  triamcinolone (NASACORT) 55 MCG/ACT AERO nasal inhaler Place 2 sprays into the nose daily as needed (congestion).   Yes [provider]  blood glucose meter kit and supplies Dispense based on patient and insurance preference. Used to check blood sugar daily, DX: E11.9 OneTouch 12/20/17   Plotnikov, Evie Lacks, MD  glucose blood (TRUETEST TEST) test strip Used to check blood sugar daily, DX: E11.9 09/05/20   Plotnikov, Evie Lacks, MD  Lancets (ONETOUCH ULTRASOFT) lancets Used to check blood sugar daily, DX: E11.9 12/20/17   Plotnikov, Evie Lacks, MD    Allergies    Erythromycin and Gabapentin  Review of Systems   Review of Systems  Constitutional:  Negative for chills and fever.  HENT:  Negative for ear pain and sore throat.   Eyes:  Negative for pain and visual disturbance.  Respiratory:  Negative for cough and shortness of breath.   Cardiovascular:  Negative for chest pain and palpitations.  Gastrointestinal:  Negative for abdominal pain and vomiting.  Genitourinary:  Negative for dysuria and hematuria.  Musculoskeletal:  Negative for arthralgias and back pain.  Skin:  Positive for color change. Negative for rash.  Neurological:  Positive for headaches. Negative for seizures and syncope.  All other systems reviewed and are negative.  Physical Exam Updated Vital Signs BP (!) 148/78   Pulse 94   Temp 98 F (36.7 C) (Oral)   Resp 18   SpO2 95%   Physical Exam Vitals and nursing note reviewed.  Constitutional:      General: She is not in  acute distress.    Appearance: She is well-developed.  HENT:     Head: Normocephalic and atraumatic.     Nose: Nose normal.     Mouth/Throat:     Mouth: Mucous membranes are moist.  Eyes:     Extraocular Movements: Extraocular movements intact.     Conjunctiva/sclera: Conjunctivae normal.     Pupils: Pupils are equal, round, and reactive to light.  Cardiovascular:     Rate and Rhythm: Normal rate and regular rhythm.     Pulses: Normal pulses.     Heart sounds: Normal heart sounds. No murmur heard. Pulmonary:     Effort: Pulmonary effort is normal. No respiratory distress.     Breath sounds: Normal breath sounds.  Abdominal:     General: Abdomen is flat. There is no distension.     Palpations: Abdomen is soft.     Tenderness: There is no abdominal tenderness.  Musculoskeletal:        General: Tenderness present.     Cervical back: Normal range of motion and neck supple. No tenderness.     Comments: Tenderness and bruising to the left flank posteriorly, no midline spinal pain throughout  Skin:    General: Skin is warm and dry.     Capillary Refill: Capillary refill takes less than 2 seconds.  Neurological:     General: No focal deficit present.     Mental Status: She is alert  and oriented to person, place, and time.     Cranial Nerves: No cranial nerve deficit.     Sensory: No sensory deficit.     Motor: No weakness.     Coordination: Coordination normal.     Comments: 5+ out of 5 strength throughout, normal sensation, no drift, normal speech, normal finger-nose-finger    ED Results / Procedures / Treatments   Labs (all labs ordered are listed, but only abnormal results are displayed) Labs Reviewed  COMPREHENSIVE METABOLIC PANEL - Abnormal; Notable for the following components:      Result Value   Chloride 92 (*)    Glucose, Bld 145 (*)    BUN 58 (*)    Creatinine, Ser 4.96 (*)    Calcium 10.6 (*)    GFR, Estimated 9 (*)    Anion gap 20 (*)    All other components  within normal limits  CBC WITH DIFFERENTIAL/PLATELET - Abnormal; Notable for the following components:   WBC 13.7 (*)    RBC 3.80 (*)    MCV 105.5 (*)    Neutro Abs 9.1 (*)    Monocytes Absolute 1.1 (*)    All other components within normal limits    EKG None  Radiology CT Head Wo Contrast  Result Date: 09/14/2021 CLINICAL DATA:  Fall, striking head on a dresser. History of a [facility] resection. EXAM: CT HEAD WITHOUT CONTRAST TECHNIQUE: Contiguous axial images were obtained from the base of the skull through the vertex without intravenous contrast. COMPARISON:  Multiple exams, including 01/11/2013 FINDINGS: Brain: Prior right suboccipital decompression. Suspected small remote lacunar infarct in the right cerebellum on image 5 series 3. Otherwise, the brainstem, cerebellum, cerebral peduncles, thalamus, basal ganglia, basilar cisterns, and ventricular system appear within normal limits. No intracranial hemorrhage, mass lesion, or acute CVA. Vascular: There is atherosclerotic calcification of the cavernous carotid arteries bilaterally. Skull: Suboccipital decompression. Sinuses/Orbits: Left maxillary antrostomy. Other: Suspected failure fusion of the anterior arch of C1, and resection of the posterior arch of C1. IMPRESSION: 1. No acute intracranial findings. 2. Prior suboccipital decompression. 3. Suspected remote small lacunar infarct in the right cerebellum. 4. Atherosclerosis. Electronically Signed   By: Van Clines M.D.   On: 09/14/2021 17:42   CT Chest W Contrast  Result Date: 09/14/2021 CLINICAL DATA:  Fall, striking left flank and chest on a dresser. Bruising. EXAM: CT CHEST, ABDOMEN, AND PELVIS WITH CONTRAST TECHNIQUE: Multidetector CT imaging of the chest, abdomen and pelvis was performed following the standard protocol during bolus administration of intravenous contrast. CONTRAST:  59mL OMNIPAQUE IOHEXOL 350 MG/ML SOLN COMPARISON:  Multiple exams, including 08/05/2021 FINDINGS:  CT CHEST FINDINGS Cardiovascular: Coronary, aortic arch, and branch vessel atherosclerotic vascular disease. Mediastinum/Nodes: Unremarkable Lungs/Pleura: Mild scarring in the lingula. Musculoskeletal: Substantial degenerative glenohumeral arthropathy on the right. 2 mm displacement of a acute left posterolateral ninth rib fracture on image 94 series 508. Lower thoracic spondylosis. CT ABDOMEN PELVIS FINDINGS Hepatobiliary: Unremarkable Pancreas: Unremarkable Spleen: Unremarkable Adrenals/Urinary Tract: Similar appearance of hypodense renal lesions favoring cysts. Bilateral renal atrophy. Adrenal glands unremarkable. No hydronephrosis, hydroureter, or urinary tract calculi observed. Stomach/Bowel: Sigmoid colon diverticulosis without findings of active diverticulitis. Borderline prominence of stool in the upper colon. Subtle residual mesenteric edema along right lower quadrant small bowel loop but without the borderline dilatation shown on the 08/05/2021 exam, significance uncertain. Small adjacent localized mesenteric lymph nodes. Today's exam is not a CT angiogram but I do not observe high-grade stenosis of the celiac trunk or  SMA. Vascular/Lymphatic: Atherosclerosis is present, including aortoiliac atherosclerotic disease. Reproductive: Calcifications along the uterine fundus probably in a small fibroid. Adnexa unremarkable. Other: No supplemental non-categorized findings. Musculoskeletal: Umbilical hernia containing adipose tissue measuring 8.8 by 4.9 by 8.0 cm. Mildly exaggerated lumbar lordosis with lumbar spondylosis and degenerative disc disease. Grade 1 degenerative retrolisthesis at L2-3. IMPRESSION: 1. 2 mm displacement of an acute left posterolateral ninth rib fracture. No pneumothorax or pleural effusion. 2. Mildly reduced mesenteric stranding/edema associated with the distal ileum compared to the 08/05/2021 exam. This remains nonspecific but could reflect underlying inflammation. There some small  localized adjacent mesenteric lymph nodes. 3. Other imaging findings of potential clinical significance: Aortic Atherosclerosis (ICD10-I70.0). Coronary atherosclerosis. Sigmoid colon diverticulosis. Borderline appearance for constipation. Small uterine for calcified fundal fibroid. Moderate-sized umbilical hernia containing adipose tissue. Lumbar and lower thoracic spondylosis. Degenerative right glenohumeral arthropathy. Electronically Signed   By: Van Clines M.D.   On: 09/14/2021 17:37   CT Cervical Spine Wo Contrast  Result Date: 09/14/2021 CLINICAL DATA:  Fall with neck pain EXAM: CT CERVICAL SPINE WITHOUT CONTRAST TECHNIQUE: Multidetector CT imaging of the cervical spine was performed without intravenous contrast. Multiplanar CT image reconstructions were also generated. COMPARISON:  X-ray 02/06/2018 FINDINGS: Alignment: Facet joints are aligned without dislocation or traumatic listhesis. Dens and lateral masses are aligned. Straightening of the cervical lordosis. 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 No acute fracture. No evidence of aggressive bone lesion. Soft tissues and spinal canal: No prevertebral fluid or swelling. No visible canal hematoma. Disc levels: Degenerative disc disease is most pronounced at C5-6, C6-7, and C7-T1. Multilevel facet arthropathy, most pronounced at C7-T1 bilaterally. Upper chest: Negative. Other: Medial course of the right common carotid artery. IMPRESSION: 1. No acute fracture or traumatic listhesis of the cervical spine. 2. Multilevel cervical spondylosis. Electronically Signed   By: Davina Poke D.O.   On: 09/14/2021 17:32   CT Abdomen Pelvis W Contrast  Result Date: 09/14/2021 CLINICAL DATA:  Fall, striking left flank and chest on a dresser. Bruising. EXAM: CT CHEST, ABDOMEN, AND PELVIS WITH CONTRAST TECHNIQUE: Multidetector CT imaging of the chest, abdomen and pelvis was performed  following the standard protocol during bolus administration of intravenous contrast. CONTRAST:  11mL OMNIPAQUE IOHEXOL 350 MG/ML SOLN COMPARISON:  Multiple exams, including 08/05/2021 FINDINGS: CT CHEST FINDINGS Cardiovascular: Coronary, aortic arch, and branch vessel atherosclerotic vascular disease. Mediastinum/Nodes: Unremarkable Lungs/Pleura: Mild scarring in the lingula. Musculoskeletal: Substantial degenerative glenohumeral arthropathy on the right. 2 mm displacement of a acute left posterolateral ninth rib fracture on image 94 series 508. Lower thoracic spondylosis. CT ABDOMEN PELVIS FINDINGS Hepatobiliary: Unremarkable Pancreas: Unremarkable Spleen: Unremarkable Adrenals/Urinary Tract: Similar appearance of hypodense renal lesions favoring cysts. Bilateral renal atrophy. Adrenal glands unremarkable. No hydronephrosis, hydroureter, or urinary tract calculi observed. Stomach/Bowel: Sigmoid colon diverticulosis without findings of active diverticulitis. Borderline prominence of stool in the upper colon. Subtle residual mesenteric edema along right lower quadrant small bowel loop but without the borderline dilatation shown on the 08/05/2021 exam, significance uncertain. Small adjacent localized mesenteric lymph nodes. Today's exam is not a CT angiogram but I do not observe high-grade stenosis of the celiac trunk or SMA. Vascular/Lymphatic: Atherosclerosis is present, including aortoiliac atherosclerotic disease. Reproductive: Calcifications along the uterine fundus probably in a small fibroid. Adnexa unremarkable. Other: No supplemental non-categorized findings. Musculoskeletal: Umbilical hernia containing adipose tissue measuring 8.8 by 4.9 by 8.0 cm. Mildly exaggerated lumbar lordosis with lumbar  spondylosis and degenerative disc disease. Grade 1 degenerative retrolisthesis at L2-3. IMPRESSION: 1. 2 mm displacement of an acute left posterolateral ninth rib fracture. No pneumothorax or pleural effusion. 2.  Mildly reduced mesenteric stranding/edema associated with the distal ileum compared to the 08/05/2021 exam. This remains nonspecific but could reflect underlying inflammation. There some small localized adjacent mesenteric lymph nodes. 3. Other imaging findings of potential clinical significance: Aortic Atherosclerosis (ICD10-I70.0). Coronary atherosclerosis. Sigmoid colon diverticulosis. Borderline appearance for constipation. Small uterine for calcified fundal fibroid. Moderate-sized umbilical hernia containing adipose tissue. Lumbar and lower thoracic spondylosis. Degenerative right glenohumeral arthropathy. Electronically Signed   By: Van Clines M.D.   On: 09/14/2021 17:37   CT T-SPINE NO CHARGE  Result Date: 09/14/2021 CLINICAL DATA:  Fall.  Back pain and bruising. EXAM: CT THORACIC SPINE WITHOUT CONTRAST TECHNIQUE: Multidetector CT images of the thoracic were obtained using the standard protocol without intravenous contrast. COMPARISON:  Two-view chest x-ray 03/01/2016 FINDINGS: Alignment: No significant listhesis is present. Vertebrae: Vertebral body heights are preserved. Fused anterior osteophytes are present. No acute healing fractures are present. Visualized ribs are within normal limits. Paraspinal and other soft tissues: Paraspinous soft tissues are within normal limits. Visualized lung fields are clear. No pneumothorax is present. Disc levels: Extensive ankylosis is present. Foramina and the central canal are patent. IMPRESSION: 1. No acute or healing fractures. 2. Extensive ankylosis. Electronically Signed   By: San Morelle M.D.   On: 09/14/2021 17:17   CT L-SPINE NO CHARGE  Result Date: 09/14/2021 CLINICAL DATA:  Fall.  Bruising over left posterior ribs.  Pain. EXAM: CT LUMBAR SPINE WITHOUT CONTRAST TECHNIQUE: Multidetector CT imaging of the lumbar spine was performed without intravenous contrast administration. Multiplanar CT image reconstructions were also generated.  COMPARISON:  CT of the abdomen pelvis 08/05/2021 FINDINGS: Segmentation: 5 non rib-bearing lumbar type vertebral bodies are present. The lowest fully formed vertebral body is L5. Alignment: Slight retrolisthesis at L1-2 and L2-3 is stable. Vertebrae: Vertebral body heights are maintained. Endplate degenerative changes are stable. No acute or healing fractures are present. Paraspinal and other soft tissues: Atherosclerotic changes are present in the aorta without aneurysm. Bilateral renal atrophy and cystic change is stable. Disc levels: L1-2: Broad-based disc protrusion and endplate changes contribute to mild foraminal narrowing, right greater than left. L2-3: Asymmetric right-sided facet hypertrophy uncovering of broad-based disc protrusion is present. Moderate foraminal narrowing is worse right than left. The central canal is patent. L3-4: Broad-based disc protrusion is present. Advanced facet hypertrophy and ligamentum flavum thickening contribute to moderate central canal stenosis, left greater than right. Moderate foraminal narrowing worse right than left. L4-5: Chronic loss of disc height is present. Facet hypertrophy and endplate spurring contribute to moderate foraminal narrowing, right greater than left. Mild central canal narrowing is present. L5-S1: Broad-based disc protrusion present. Facet spurring contributes to moderate foraminal narrowing bilaterally. IMPRESSION: 1. No acute or healing fractures. 2. Stable multilevel spondylosis of the lumbar spine as described. 3. Bilateral renal atrophy and cystic change is stable. 4. Aortic Atherosclerosis (ICD10-I70.0). Electronically Signed   By: San Morelle M.D.   On: 09/14/2021 17:13    Procedures Procedures   Medications Ordered in ED Medications  fentaNYL (SUBLIMAZE) injection 50 mcg (50 mcg Intravenous Given 09/14/21 1626)  iohexol (OMNIPAQUE) 350 MG/ML injection 80 mL (80 mLs Intravenous Contrast Given 09/14/21 1637)    ED Course  I have  reviewed the triage vital signs and the nursing notes.  Pertinent labs & imaging results that were  available during my care of the patient were reviewed by me and considered in my medical decision making (see chart for details).    MDM Rules/Calculators/A&P                           MILAROSE SAVICH is here with flank pain, headache after fall.  History of end-stage renal disease on hemodialysis.  Does get heparin at times with dialysis.  Did not have dialysis today.  Is due for dialysis tomorrow.  Has bruising to the left flank on exam.  She had a negative chest x-ray at orthopedics today.  Given that she intermittently is on heparin and is due to may be have heparin tomorrow with dialysis we will get trauma images to further evaluate for injuries.  She does not make much urine anymore.  Lab work is unremarkable.  Neurologically she appears intact.  Images unremarkable except for left ninth rib fracture.  Patient feeling better after fentanyl.  Chronically on opioids and muscle relaxant.  Has lidocaine patch at home.  Recommend continue treatment with this medication regimen.  Will give incentive spirometer.  Discharged in good condition.  Recommend follow-up with primary care doctor.  This chart was dictated using voice recognition software.  Despite best efforts to proofread,  errors can occur which can change the documentation meaning.   Final Clinical Impression(s) / ED Diagnoses Final diagnoses:  Pain  Closed fracture of one rib of left side, initial encounter    Rx / DC Orders ED Discharge Orders     None        Lennice Sites, DO 09/14/21 1751

## 2021-09-14 NOTE — Progress Notes (Signed)
09/14/2021 [name] [id number] 02-09-1951   Chief Complaint: Enteritis follow-up  History of Present Illness: [name] is a 70 year old female with a past medical history of anxiety, depression, hypertension, diabetes mellitus type 2, ESRD on hemodialysis TTS, chronic anemia, benign brain tumor, GERD, irritable bowel syndrome and microscopic colitis.  She is followed by Dr. Marina Goodell.  She was admitted to the hospital 08/05/2021 with lower abdominal pain started during dialysis and she was noted to have low blood pressure as well. Labs showed marked leukocytosis with WBC 26.8. CTAP showed evidence of possible infectious versus inflammatory enteritis. She was treated with Zosyn IV then Augmentin po. She was discharged home on 08/11/2021 on Augmentin po bid x 5 days (to complete a total of 10 days).  She presents to our office today for further GI follow-up.  She stated her generalized abdominal pain had which resulted in the above hospital admission completely resolved without recurrence.  She denies having any diarrhea.  She is passing normal formed brown bowel movement most days but her stools are smaller and she is feeling a bit constipated.  No rectal bleeding or melena.  She is taking MiraLAX once or twice weekly.  However, she continues to have mild epigastric discomfort which was also present during her hospital admission 07/2021.  An abdominal sonogram 08/10/2021 showed a normal gallbladder and liver.  Eating does not improve or worsen her epigastric pain.  She denies having any nausea or vomiting.  No heartburn as long as she remains on Pantoprazole 40 mg daily.  If she skips 1 dose of Pantoprazole she develops heartburn.  No dysphagia.  She underwent an EGD in 2003 which showed a hiatal hernia otherwise was normal.  Her most recent colonoscopy 01/2015 which identified 2 polyps which were removed from the transverse and ascending colon, however, biopsy showed polypoid benign colonic mucosa.   She was initially advised to repeat a colonoscopy in 5years then her recall was changed by Dr. Marina Goodell to 01/2025. She continues with hemodialysis every Tuesday, Thursday and Saturday without currents of her abdominal pain or hypotension.  She fell earlier this morning while at home and hit the left side of her head and ribs when she landed.  She denies having any visual changes or severe headache at this time.  She intends to go directly to the ED after this office visit to undergo further evaluation.  She is a retired ED, Endo and Armed forces technical officer.  CBC Latest Ref Rng & Units 08/11/2021 08/10/2021 08/09/2021  WBC 4.0 - 10.5 K/uL 9.9 8.5 9.9  Hemoglobin 12.0 - 15.0 g/dL 3.5(O) 2.2(S) 8.9(L)  Hematocrit 36.0 - 46.0 % 26.7(L) 28.1(L) 27.4(L)  Platelets 150 - 400 K/uL 191 175 179    CMP Latest Ref Rng & Units 08/11/2021 08/10/2021 08/09/2021  Glucose 70 - 99 mg/dL 234(X) 79 16(H)  BUN 8 - 23 mg/dL 12(A) 59(Z) 44(V)  Creatinine 0.44 - 1.00 mg/dL 3.23(M) 7.11(I) 6.69(F)  Sodium 135 - 145 mmol/L 134(L) 133(L) 134(L)  Potassium 3.5 - 5.1 mmol/L 3.5 3.6 3.5  Chloride 98 - 111 mmol/L 97(L) 96(L) 96(L)  CO2 22 - 32 mmol/L 24 21(L) 26  Calcium 8.9 - 10.3 mg/dL 9.0 9.0 9.1  Total Protein 6.5 - 8.1 g/dL 8.3(N) 9.7(Y) 5.7(L)  Total Bilirubin 0.3 - 1.2 mg/dL 0.8 0.8 0.7  Alkaline Phos 38 - 126 U/L 118 110 121  AST 15 - 41 U/L 33 20 16  ALT 0 - 44 U/L 27  18 15      Colonoscopy in 2016 with [name]:  1. Two polyps were found in the transverse colon and ascending colon; polypectomy was performed with a cold snare 2. Mild diverticulosis was noted in the sigmoid colon 3. The examination was otherwise normal Surgical [P], ascending, transverse, polyp (2) - POLYPOID FRAGMENT OF BENIGN COLONIC MUCOSA WITH NO EVIDENCE OF ADENOMATOUS CHANGES OR MALIGNANCY. - FECAL MATERIAL. - Recall changed 2026 per [name]  CTAP without contrast 08/05/2021: 1. Short segment of small bowel in the right mid abdomen demonstrates  mild distension, fecalized contents, wall thickening, and perienteric fat stranding. Findings are suspicious for enteritis, may be infectious or inflammatory. No bowel perforation. 2. Mild sigmoid diverticulosis without focal diverticulitis. 3. Moderate-sized fat containing umbilical hernia. No bowel involvement or inflammation. 4. Sequela of chronic renal disease with renal parenchymal atrophy.  Aortic Atherosclerosis   RUQ sonogram 08/10/2021: Gallbladder: No gallstones or wall thickening visualized. No sonographic Murphy sign noted by sonographer.  Common bile duct: Diameter: 3 mm.  Liver: No focal lesion identified. Within normal limits in parenchymal echogenicity. Portal vein is patent on color Doppler imaging with normal direction of blood flow towards the liver. Atrophic kidneys with increased parenchymal echogenicity suggestive of renal parenchymal disease.   Past Medical History:  Diagnosis Date   Anemia    Anxiety    Brain tumor (benign) ([place])    Colitis 2010   microscopic- Dr Henrene Pastor   Depression    Diabetes mellitus    type II   Dyspnea    with exertion   ESRD (end stage renal disease) ([place])    TTUSAT [address]    Fibromyalgia    GERD (gastroesophageal reflux disease)    Headache    History of blood transfusion    after knee surgery   Hypertension    discontinued all diuretics and antihypertensives   IBS (irritable bowel syndrome)    LBP (low back pain)    Neuropathy    feet bilat    Osteoarthritis    Osteopenia    Pneumonia    hx of 2014    Rotator cuff tear, right    Sinusitis    currently being treated with antibiotic will complete 03/04/2015   Past Surgical History:  Procedure Laterality Date   AV FISTULA PLACEMENT Right 09/12/2020   Procedure: RIGHT ARTERIOVENOUS (AV) FISTULA CREATION;  Surgeon: Elam Dutch, MD;  Location: [place];  Service: Vascular;  Laterality: Right;   [place] Right 10/27/2020   Procedure: RIGHT UPPER  EXTREMITY SECOND STAGE [place];  Surgeon: Elam Dutch, MD;  Location: [place];  Service: Vascular;  Laterality: Right;   foramen magnum ependymoma surgery  2003   Dr Rita Ohara   JOINT REPLACEMENT Bilateral    NASAL SINUS SURGERY     1973    TONSILLECTOMY     TOTAL KNEE ARTHROPLASTY     L 2008, R 2009, R 2016- Dr Maureen Ralphs   TOTAL KNEE REVISION Right 03/05/2015   Procedure: RIGHT TOTAL KNEE ARTHROPLASTY REVISION;  Surgeon: Gaynelle Arabian, MD;  Location: [facility];  Service: Orthopedics;  Laterality: Right;   Current Outpatient Medications on File Prior to Visit  Medication Sig Dispense Refill   acetaminophen (TYLENOL) 500 MG tablet Take 1,000 mg by mouth 2 (two) times daily as needed for moderate pain or headache.     amoxicillin (AMOXIL) 500 MG capsule TAKE 4 CAPSULES BY MOUTH 1  HOUR PRIOR TO DENTAL  PROCEDURE/CLEANING (Patient taking differently: TAKE 4 CAPSULES BY MOUTH 1  HOUR PRIOR TO DENTAL  PROCEDURE/CLEANING) 16 capsule 1   aspirin-acetaminophen-caffeine (EXCEDRIN MIGRAINE) 250-250-65 MG tablet Take 1 tablet by mouth daily as needed for headache or migraine. Takes no more than 4/week  per nephro     B Complex-C-Folic Acid (RENA-VITE PO) Take 1 tablet a day     blood glucose meter kit and supplies Dispense based on patient and insurance preference. Used to check blood sugar daily, DX: E11.9 OneTouch 1 each 0   buPROPion (WELLBUTRIN XL) 300 MG 24 hr tablet Take 1 tablet (300 mg total) by mouth at bedtime. 90 tablet 3   cyclobenzaprine (FLEXERIL) 10 MG tablet Take 1 tablet (10 mg total) by mouth 3 (three) times daily as needed for muscle spasms. (Patient taking differently: Take 10 mg by mouth daily as needed for muscle spasms.) 270 tablet 0   dicyclomine (BENTYL) 20 MG tablet Take one tablet every 4-6 hours as needed for abdominal pain (Patient taking differently: Take 20 mg by mouth every 4 (four) hours as needed (abdominal pain).) 60 tablet 2   docusate sodium (COLACE)  100 MG capsule Take 100 mg by mouth 2 (two) times daily.     DULoxetine (CYMBALTA) 60 MG capsule Take 1 capsule (60 mg total) by mouth at bedtime. 90 capsule 3   glimepiride (AMARYL) 1 MG tablet Take 1 tablet (1 mg total) by mouth daily before breakfast. 90 tablet 3   glucose blood (TRUETEST TEST) test strip Used to check blood sugar daily, DX: E11.9 100 each 3   ketoconazole (NIZORAL) 2 % cream APPLY TO THE AFFECTED AREA(S) BY TOPICAL ROUTE ONCE DAILY 60 g 2   Lancets (ONETOUCH ULTRASOFT) lancets Used to check blood sugar daily, DX: E11.9 100 each 3   lidocaine-prilocaine (EMLA) cream SMARTSIG:Sparingly Topical As Directed     loratadine (CLARITIN) 10 MG tablet Take 10 mg by mouth daily as needed for allergies.     LORazepam (ATIVAN) 2 MG tablet Take 1 tablet (2 mg total) by mouth at bedtime as needed for insomnia 90 tablet 2   midodrine (PROAMATINE) 5 MG tablet Take 1-2 tablet by mouth three times a week as directed Take 30 minutes before dialysis and another tablet during middle of dialysis treatment 90 tablet 3   multivitamin-lutein (OCUVITE-LUTEIN) CAPS capsule Take 1 capsule by mouth daily.     oxyCODONE (ROXICODONE) 15 MG immediate release tablet Take 1 tablet (15 mg total) by mouth 4 (four) times daily as needed. 120 tablet 0   pantoprazole (PROTONIX) 40 MG tablet Take 1 tablet (40 mg total) by mouth daily. 90 tablet 3   pilocarpine (SALAGEN) 5 MG tablet Take 1 tablet (5 mg total) by mouth 2 (two) times daily. 180 tablet 3   pioglitazone (ACTOS) 15 MG tablet Take 1 tablet (15 mg total) by mouth daily. 90 tablet 3   polyethylene glycol (MIRALAX / GLYCOLAX) 17 g packet Take 17 g by mouth daily as needed (constipation.).     sevelamer carbonate (RENVELA) 800 MG tablet Take 1,600 mg by mouth 3 (three) times daily with meals. Take 2 tablets tid and 1 tablet if eating a snack     Soft Lens Products (SENSITIVE EYES SALINE) SOLN Place 1 drop into both eyes as needed.     triamcinolone (NASACORT) 55  MCG/ACT AERO nasal inhaler Place 2 sprays into the nose daily.     No current facility-administered medications on file prior to visit.  Allergies  Allergen Reactions   Erythromycin Diarrhea and Nausea And Vomiting   Gabapentin Other (See Comments)    Confusion and falling     Current Medications, Allergies, Past Medical History, Past Surgical History, Family History and Social History were reviewed in [employer] record.  Review of Systems:   Constitutional: Negative for fever, sweats, chills or weight loss.  Respiratory: Negative for shortness of breath.   Cardiovascular: Negative for chest pain, palpitations and leg swelling.  Gastrointestinal: See HPI.  Musculoskeletal: Left rib care pain.   Neurological: Negative for dizziness, headaches or paresthesias.   Physical Exam: BP 100/60   Pulse 66   Ht $R'5\' 2"'mB$  (1.575 m)   Wt 215 lb (97.5 kg)   BMI 39.32 kg/m   General: 70 year old female in no acute distress. Head: Normocephalic and atraumatic. No contusions or lacerations.  Eyes: No scleral icterus. Conjunctiva pink . Ears: Normal auditory acuity. Mouth: Dentition intact. No ulcers or lesions.  Lungs: Clear throughout to auscultation. Heart: Regular rate and rhythm, no murmur. Chest: left rib cage moderate tenderness.  Abdomen: Soft, nondistended.  Very mild epigastric tenderness without rebound or guarding.  No masses or hepatomegaly. Normal bowel sounds x 4 quadrants.  Rectal: Deferred. Musculoskeletal: Symmetrical with no gross deformities. Extremities: No edema. Neurological: Alert oriented x 4. No focal deficits.  Psychological: Alert and cooperative. Normal mood and affect  Assessment and Recommendations:  80) 70 year old female admitted to the hospital 08/05/2021 with generalized lower abdominal pain.  CTAP showed evidence of infectious versus inflammatory enteritis with significant leukocytosis treated with Zosyn IV then Augmentin p.o. bid for  total of 10 days.  Generalized to lower abdominal pain completely abated without recurrence.  She has mild constipation. -Continue Dicyclomine 10 mg p.o. twice daily as needed -Patient to call our office if her generalized or lower abdominal pain recurs -Miralax Q HS as needed   2) Epigastric pain -May require a eventual EGD -Continue Pantoprazole 40 mg every morning.  May take Pepcid 20 mg nightly. -Patient to call our office if her symptoms worsen -Await laboratory results from ED evaluation -Follow-up with [name] in 8-week  3) History of colon polyps.  Colonoscopy 01/2015 identified 2 polyps which were removed from the transverse and ascending colon but biopsy showed benign polypoid colonic mucosa. -Next colonoscopy due 01/2025 if medically appropriate at that time  4) Fall at home today, hit left side of head and left rib cage. No overt neurological changes -Patient to go to ER now for further evaluation   5) ESRD on HD every TTS  6) Chronic anemia secondary to ESRD.  No overt GI bleeding -I expect the patient will undergo laboratory studies in the ED therefore I did not recheck a CBC at this time

## 2021-09-14 NOTE — ED Triage Notes (Signed)
Pt arrived via POV c/o mechanical fall, hit head and back. No LOC. Gets heparin injections.

## 2021-09-14 NOTE — ED Provider Notes (Signed)
Emergency Medicine Provider Triage Evaluation Note  [name] , a 70 y.o. female  was evaluated in triage.  Pt complains of fall.  [address] this morning hitting her head and striking her left flank and chest on a dresser.  Went to Ortho urgent care and had a chest x-ray done that was negative but she has bruising over her left posterior ribs and continued pain, worse with inspiration.  Receives heparin with dialysis, last dialyzed on Saturday, not on any other anticoagulation.  Denies abdominal pain.    Review of Systems  Positive: Head injury, rib pain, pleuritic pain, Negative: Vomiting, abdominal pain, numbness, weakness  Physical Exam  BP 140/78 (BP Location: Left Arm)   Pulse 88   Temp 98 F (36.7 C) (Oral)   Resp 18   SpO2 94%  Gen:   Awake, no distress   Resp:  Normal effort, bruising and tenderness over the left posterior ribs MSK:   Moves extremities without difficulty  Other:    Medical Decision Making  Medically screening exam initiated at 1:26 PM.  Appropriate orders placed.  [name] was informed that the remainder of the evaluation will be completed by another provider, this initial triage assessment does not replace that evaluation, and the importance of remaining in the ED until their evaluation is complete.     Anita Larsen, PA-C 09/14/21 1329    Anita Merino, MD 09/14/21 [phone]

## 2021-09-15 DIAGNOSIS — N186 End stage renal disease: Secondary | ICD-10-CM | POA: Diagnosis not present

## 2021-09-15 DIAGNOSIS — E1129 Type 2 diabetes mellitus with other diabetic kidney complication: Secondary | ICD-10-CM | POA: Diagnosis not present

## 2021-09-15 DIAGNOSIS — Z23 Encounter for immunization: Secondary | ICD-10-CM | POA: Diagnosis not present

## 2021-09-15 DIAGNOSIS — Z992 Dependence on renal dialysis: Secondary | ICD-10-CM | POA: Diagnosis not present

## 2021-09-15 DIAGNOSIS — D688 Other specified coagulation defects: Secondary | ICD-10-CM | POA: Diagnosis not present

## 2021-09-15 DIAGNOSIS — G8929 Other chronic pain: Secondary | ICD-10-CM | POA: Diagnosis not present

## 2021-09-15 DIAGNOSIS — N2581 Secondary hyperparathyroidism of renal origin: Secondary | ICD-10-CM | POA: Diagnosis not present

## 2021-09-16 ENCOUNTER — Telehealth: Payer: Self-pay

## 2021-09-16 NOTE — Chronic Care Management (AMB) (Signed)
Chronic Care Management Pharmacy Assistant   Name: [name]  MRN: [id number] DOB: 04-16-1951  Anita KEEVEN is an 70 y.o. year old female who presents for her follow-up CCM visit with the clinical pharmacist.  Reason for Encounter: Disease State General    Recent office visits:  None noted  Recent consult visits:  09/14/21 Alcide Evener NP Laurette Schimke- pt was seen for Enteritis. No labs ordered and no med changes. Follow up with Dr. Marina Goodell on 11/17/21 at 9:20 am.  Hospital visits:  Medication Reconciliation was completed by comparing discharge summary, patient's EMR and Pharmacy list, and upon discussion with patient.  Admitted to the hospital on 09/14/21 due to Closed fracture of one rib of left side. Discharge date was 09/14/21. Discharged from [facility].    New?Medications Started at [facility] Discharge:?? -started none  Medication Changes at Hospital Discharge: -Changed none  Medications Discontinued at Hospital Discharge: -Stopped none  Medications that remain the same after Hospital Discharge:??  -All other medications will remain the same.    Medications: Outpatient Encounter Medications as of 09/16/2021  Medication Sig Note   acetaminophen (TYLENOL) 500 MG tablet Take 1,000 mg by mouth 2 (two) times daily as needed for headache.    amoxicillin (AMOXIL) 500 MG capsule TAKE 4 CAPSULES BY MOUTH 1  HOUR PRIOR TO DENTAL  PROCEDURE/CLEANING (Patient taking differently: Take 2,000 mg by mouth See admin instructions. Take 4 capsules (2000 mg) by mouth one hour prior to dental procedure/cleaning)    aspirin-acetaminophen-caffeine (EXCEDRIN MIGRAINE) 250-250-65 MG tablet Take 1 tablet by mouth daily as needed for headache or migraine. Max 3 doses in one week    B Complex-C-Folic Acid (RENA-VITE PO) Take 1 tablet by mouth every morning.    blood glucose meter kit and supplies Dispense based on patient and insurance preference. Used to check blood sugar  daily, DX: E11.9 OneTouch    buPROPion (WELLBUTRIN XL) 300 MG 24 hr tablet Take 1 tablet (300 mg total) by mouth at bedtime.    carboxymethylcellulose (REFRESH PLUS) 0.5 % SOLN Place 1 drop into both eyes 3 (three) times daily as needed (dry eyes/irritation).    cyclobenzaprine (FLEXERIL) 10 MG tablet Take 1 tablet (10 mg total) by mouth 3 (three) times daily as needed for muscle spasms. (Patient taking differently: Take 30 mg by mouth at bedtime.)    dicyclomine (BENTYL) 20 MG tablet Take one tablet every 4-6 hours as needed for abdominal pain (Patient taking differently: Take 20 mg by mouth every 4 (four) hours as needed (abdominal pain).)    docusate sodium (COLACE) 100 MG capsule Take 100 mg by mouth 2 (two) times daily.    DULoxetine (CYMBALTA) 60 MG capsule Take 1 capsule (60 mg total) by mouth at bedtime.    glimepiride (AMARYL) 1 MG tablet Take 1 tablet (1 mg total) by mouth daily before breakfast.    glucose blood (TRUETEST TEST) test strip Used to check blood sugar daily, DX: E11.9    ketoconazole (NIZORAL) 2 % cream APPLY TO THE AFFECTED AREA(S) BY TOPICAL ROUTE ONCE DAILY (Patient taking differently: Apply 1 application topically daily as needed for irritation (rash).)    ketoconazole (NIZORAL) 200 MG tablet Take 100 mg by mouth See admin instructions. Take 1/2 tablet (100 mg) by mouth daily for 30 days - as needed for yeast infection    Lancets (ONETOUCH ULTRASOFT) lancets Used to check blood sugar daily, DX: E11.9    lidocaine-prilocaine (EMLA) cream Apply 1  application topically See admin instructions. Apply topically to port access one hour prior to dialysis, cover with plastic- Tuesday, Thursday, Saturday    loratadine (CLARITIN) 10 MG tablet Take 10 mg by mouth daily as needed for allergies.    LORazepam (ATIVAN) 2 MG tablet Take 1 tablet (2 mg total) by mouth at bedtime as needed for insomnia    midodrine (PROAMATINE) 5 MG tablet Take 1-2 tablet by mouth three times a week as  directed Take 30 minutes before dialysis and another tablet during middle of dialysis treatment (Patient taking differently: Take 5 mg by mouth See admin instructions. Take one tablet (5 mg) by mouth 30 minutes before dialysis (Tuesday, Thursday, Saturday); take one tablet (5 mg) during dialysis as needed for SBP <80) 09/14/2021: 2 tablets on Saturday (1st time pt had taken the 2nd tablet)   multivitamin-lutein (OCUVITE-LUTEIN) CAPS capsule Take 1 capsule by mouth every morning.    oxyCODONE (ROXICODONE) 15 MG immediate release tablet Take 1 tablet (15 mg total) by mouth 4 (four) times daily as needed. (Patient taking differently: Take 15 mg by mouth 4 (four) times daily as needed for pain.) 09/14/2021: Pt took 2 this morning after falling   pantoprazole (PROTONIX) 40 MG tablet Take 1 tablet (40 mg total) by mouth daily.    pilocarpine (SALAGEN) 5 MG tablet Take 1 tablet (5 mg total) by mouth 2 (two) times daily. (Patient taking differently: Take 5 mg by mouth 2 (two) times daily. For dry mouth)    pioglitazone (ACTOS) 15 MG tablet Take 1 tablet (15 mg total) by mouth daily.    polyethylene glycol (MIRALAX / GLYCOLAX) 17 g packet Take 17 g by mouth See admin instructions. Mix 17 g powder in 1/2 cup liquid and drink by mouth on Tuesday and Saturday evenings    sevelamer carbonate (RENVELA) 800 MG tablet Take 800-1,600 mg by mouth See admin instructions. Take 2 tablets (1600 mg) by mouth three times daily with meals, take 1 tablet (800 mg) with snacks    triamcinolone (NASACORT) 55 MCG/ACT AERO nasal inhaler Place 2 sprays into the nose daily as needed (congestion).    No facility-administered encounter medications on file as of 09/16/2021.   Have you had any problems recently with your health? Pt was diagnosed with Enteritis recently and had a fall which resulted in a closed fracture on her ribs.  Have you had any problems with your pharmacy? Pt stated she is not having any problems with her  pharmacy.  What issues or side effects are you having with your medications? Pt stated she is not having any side effects that she is aware of.  What would you like me to pass along to [facility] for them to help you with?  Pt is wondering if she can get assistance with obtaining a brace to help with her left ankle pronation and if CPP still recommends taking the Cymbalta because her prescription was never sent in.  What can we do to take care of you better? Pt stated she is pain and anything we can do to help would be great.Provider wants to see pt every 6 months but pt thinks she should see him more often with her being Diabetic. Please advise.    Star Rating Drugs: None noted  [place] Clinical Pharmacist Assistant [phone]

## 2021-09-17 DIAGNOSIS — Z992 Dependence on renal dialysis: Secondary | ICD-10-CM | POA: Diagnosis not present

## 2021-09-17 DIAGNOSIS — N2581 Secondary hyperparathyroidism of renal origin: Secondary | ICD-10-CM | POA: Diagnosis not present

## 2021-09-17 DIAGNOSIS — D688 Other specified coagulation defects: Secondary | ICD-10-CM | POA: Diagnosis not present

## 2021-09-17 DIAGNOSIS — N186 End stage renal disease: Secondary | ICD-10-CM | POA: Diagnosis not present

## 2021-09-17 DIAGNOSIS — Z23 Encounter for immunization: Secondary | ICD-10-CM | POA: Diagnosis not present

## 2021-09-17 DIAGNOSIS — G8929 Other chronic pain: Secondary | ICD-10-CM | POA: Diagnosis not present

## 2021-09-17 DIAGNOSIS — E1129 Type 2 diabetes mellitus with other diabetic kidney complication: Secondary | ICD-10-CM | POA: Diagnosis not present

## 2021-09-19 DIAGNOSIS — E1129 Type 2 diabetes mellitus with other diabetic kidney complication: Secondary | ICD-10-CM | POA: Diagnosis not present

## 2021-09-19 DIAGNOSIS — D688 Other specified coagulation defects: Secondary | ICD-10-CM | POA: Diagnosis not present

## 2021-09-19 DIAGNOSIS — N186 End stage renal disease: Secondary | ICD-10-CM | POA: Diagnosis not present

## 2021-09-19 DIAGNOSIS — G8929 Other chronic pain: Secondary | ICD-10-CM | POA: Diagnosis not present

## 2021-09-19 DIAGNOSIS — Z992 Dependence on renal dialysis: Secondary | ICD-10-CM | POA: Diagnosis not present

## 2021-09-19 DIAGNOSIS — Z23 Encounter for immunization: Secondary | ICD-10-CM | POA: Diagnosis not present

## 2021-09-19 DIAGNOSIS — N2581 Secondary hyperparathyroidism of renal origin: Secondary | ICD-10-CM | POA: Diagnosis not present

## 2021-09-22 DIAGNOSIS — D688 Other specified coagulation defects: Secondary | ICD-10-CM | POA: Diagnosis not present

## 2021-09-22 DIAGNOSIS — Z992 Dependence on renal dialysis: Secondary | ICD-10-CM | POA: Diagnosis not present

## 2021-09-22 DIAGNOSIS — N186 End stage renal disease: Secondary | ICD-10-CM | POA: Diagnosis not present

## 2021-09-22 DIAGNOSIS — N2581 Secondary hyperparathyroidism of renal origin: Secondary | ICD-10-CM | POA: Diagnosis not present

## 2021-09-22 DIAGNOSIS — E1129 Type 2 diabetes mellitus with other diabetic kidney complication: Secondary | ICD-10-CM | POA: Diagnosis not present

## 2021-09-22 DIAGNOSIS — G8929 Other chronic pain: Secondary | ICD-10-CM | POA: Diagnosis not present

## 2021-09-22 DIAGNOSIS — Z23 Encounter for immunization: Secondary | ICD-10-CM | POA: Diagnosis not present

## 2021-09-24 DIAGNOSIS — G8929 Other chronic pain: Secondary | ICD-10-CM | POA: Diagnosis not present

## 2021-09-24 DIAGNOSIS — N2581 Secondary hyperparathyroidism of renal origin: Secondary | ICD-10-CM | POA: Diagnosis not present

## 2021-09-24 DIAGNOSIS — E1129 Type 2 diabetes mellitus with other diabetic kidney complication: Secondary | ICD-10-CM | POA: Diagnosis not present

## 2021-09-24 DIAGNOSIS — Z992 Dependence on renal dialysis: Secondary | ICD-10-CM | POA: Diagnosis not present

## 2021-09-24 DIAGNOSIS — D688 Other specified coagulation defects: Secondary | ICD-10-CM | POA: Diagnosis not present

## 2021-09-24 DIAGNOSIS — N186 End stage renal disease: Secondary | ICD-10-CM | POA: Diagnosis not present

## 2021-09-24 DIAGNOSIS — Z23 Encounter for immunization: Secondary | ICD-10-CM | POA: Diagnosis not present

## 2021-09-25 ENCOUNTER — Other Ambulatory Visit (HOSPITAL_COMMUNITY): Payer: Self-pay

## 2021-09-25 MED ORDER — DULOXETINE HCL 30 MG PO CPEP
30.0000 mg | ORAL_CAPSULE | Freq: Every morning | ORAL | 0 refills | Status: DC
  Filled 2021-09-25: qty 30, 30d supply, fill #0

## 2021-09-25 MED ORDER — FLUOXETINE HCL 20 MG PO CAPS
20.0000 mg | ORAL_CAPSULE | Freq: Every morning | ORAL | 1 refills | Status: DC
  Filled 2021-09-25: qty 30, 30d supply, fill #0

## 2021-09-26 DIAGNOSIS — N2581 Secondary hyperparathyroidism of renal origin: Secondary | ICD-10-CM | POA: Diagnosis not present

## 2021-09-26 DIAGNOSIS — Z23 Encounter for immunization: Secondary | ICD-10-CM | POA: Diagnosis not present

## 2021-09-26 DIAGNOSIS — E1129 Type 2 diabetes mellitus with other diabetic kidney complication: Secondary | ICD-10-CM | POA: Diagnosis not present

## 2021-09-26 DIAGNOSIS — N186 End stage renal disease: Secondary | ICD-10-CM | POA: Diagnosis not present

## 2021-09-26 DIAGNOSIS — Z992 Dependence on renal dialysis: Secondary | ICD-10-CM | POA: Diagnosis not present

## 2021-09-26 DIAGNOSIS — G8929 Other chronic pain: Secondary | ICD-10-CM | POA: Diagnosis not present

## 2021-09-26 DIAGNOSIS — D688 Other specified coagulation defects: Secondary | ICD-10-CM | POA: Diagnosis not present

## 2021-09-29 ENCOUNTER — Other Ambulatory Visit: Payer: Self-pay | Admitting: Internal Medicine

## 2021-09-29 ENCOUNTER — Other Ambulatory Visit (HOSPITAL_COMMUNITY): Payer: Self-pay

## 2021-09-29 DIAGNOSIS — Z23 Encounter for immunization: Secondary | ICD-10-CM | POA: Diagnosis not present

## 2021-09-29 DIAGNOSIS — N2581 Secondary hyperparathyroidism of renal origin: Secondary | ICD-10-CM | POA: Diagnosis not present

## 2021-09-29 DIAGNOSIS — E1129 Type 2 diabetes mellitus with other diabetic kidney complication: Secondary | ICD-10-CM | POA: Diagnosis not present

## 2021-09-29 DIAGNOSIS — D688 Other specified coagulation defects: Secondary | ICD-10-CM | POA: Diagnosis not present

## 2021-09-29 DIAGNOSIS — N186 End stage renal disease: Secondary | ICD-10-CM | POA: Diagnosis not present

## 2021-09-29 DIAGNOSIS — G8929 Other chronic pain: Secondary | ICD-10-CM | POA: Diagnosis not present

## 2021-09-29 DIAGNOSIS — Z992 Dependence on renal dialysis: Secondary | ICD-10-CM | POA: Diagnosis not present

## 2021-09-29 MED FILL — Cyclobenzaprine HCl Tab 10 MG: ORAL | 90 days supply | Qty: 270 | Fill #0 | Status: AC

## 2021-09-30 ENCOUNTER — Other Ambulatory Visit (HOSPITAL_COMMUNITY): Payer: Self-pay

## 2021-10-01 ENCOUNTER — Other Ambulatory Visit (HOSPITAL_COMMUNITY): Payer: Self-pay

## 2021-10-01 DIAGNOSIS — D688 Other specified coagulation defects: Secondary | ICD-10-CM | POA: Diagnosis not present

## 2021-10-01 DIAGNOSIS — E1129 Type 2 diabetes mellitus with other diabetic kidney complication: Secondary | ICD-10-CM | POA: Diagnosis not present

## 2021-10-01 DIAGNOSIS — G8929 Other chronic pain: Secondary | ICD-10-CM | POA: Diagnosis not present

## 2021-10-01 DIAGNOSIS — N186 End stage renal disease: Secondary | ICD-10-CM | POA: Diagnosis not present

## 2021-10-01 DIAGNOSIS — Z23 Encounter for immunization: Secondary | ICD-10-CM | POA: Diagnosis not present

## 2021-10-01 DIAGNOSIS — Z992 Dependence on renal dialysis: Secondary | ICD-10-CM | POA: Diagnosis not present

## 2021-10-01 DIAGNOSIS — N2581 Secondary hyperparathyroidism of renal origin: Secondary | ICD-10-CM | POA: Diagnosis not present

## 2021-10-01 MED ORDER — LORAZEPAM 2 MG PO TABS
2.0000 mg | ORAL_TABLET | Freq: Every evening | ORAL | 0 refills | Status: DC | PRN
  Filled 2021-10-01: qty 30, 30d supply, fill #0

## 2021-10-03 DIAGNOSIS — Z992 Dependence on renal dialysis: Secondary | ICD-10-CM | POA: Diagnosis not present

## 2021-10-03 DIAGNOSIS — N186 End stage renal disease: Secondary | ICD-10-CM | POA: Diagnosis not present

## 2021-10-03 DIAGNOSIS — G8929 Other chronic pain: Secondary | ICD-10-CM | POA: Diagnosis not present

## 2021-10-03 DIAGNOSIS — N2581 Secondary hyperparathyroidism of renal origin: Secondary | ICD-10-CM | POA: Diagnosis not present

## 2021-10-03 DIAGNOSIS — D688 Other specified coagulation defects: Secondary | ICD-10-CM | POA: Diagnosis not present

## 2021-10-03 DIAGNOSIS — E1129 Type 2 diabetes mellitus with other diabetic kidney complication: Secondary | ICD-10-CM | POA: Diagnosis not present

## 2021-10-03 DIAGNOSIS — Z23 Encounter for immunization: Secondary | ICD-10-CM | POA: Diagnosis not present

## 2021-10-06 DIAGNOSIS — D688 Other specified coagulation defects: Secondary | ICD-10-CM | POA: Diagnosis not present

## 2021-10-06 DIAGNOSIS — E1129 Type 2 diabetes mellitus with other diabetic kidney complication: Secondary | ICD-10-CM | POA: Diagnosis not present

## 2021-10-06 DIAGNOSIS — Z992 Dependence on renal dialysis: Secondary | ICD-10-CM | POA: Diagnosis not present

## 2021-10-06 DIAGNOSIS — G8929 Other chronic pain: Secondary | ICD-10-CM | POA: Diagnosis not present

## 2021-10-06 DIAGNOSIS — Z23 Encounter for immunization: Secondary | ICD-10-CM | POA: Diagnosis not present

## 2021-10-06 DIAGNOSIS — N186 End stage renal disease: Secondary | ICD-10-CM | POA: Diagnosis not present

## 2021-10-06 DIAGNOSIS — N2581 Secondary hyperparathyroidism of renal origin: Secondary | ICD-10-CM | POA: Diagnosis not present

## 2021-10-07 ENCOUNTER — Telehealth: Payer: Self-pay

## 2021-10-07 ENCOUNTER — Other Ambulatory Visit (HOSPITAL_COMMUNITY): Payer: Self-pay

## 2021-10-07 MED ORDER — OXYCODONE HCL 15 MG PO TABS
15.0000 mg | ORAL_TABLET | Freq: Four times a day (QID) | ORAL | 0 refills | Status: DC | PRN
  Filled 2021-10-07: qty 45, 11d supply, fill #0
  Filled 2021-10-07: qty 75, 19d supply, fill #0

## 2021-10-07 NOTE — Telephone Encounter (Signed)
NOTES SCANNED TO REFERRAL 

## 2021-10-08 DIAGNOSIS — E1129 Type 2 diabetes mellitus with other diabetic kidney complication: Secondary | ICD-10-CM | POA: Diagnosis not present

## 2021-10-08 DIAGNOSIS — G8929 Other chronic pain: Secondary | ICD-10-CM | POA: Diagnosis not present

## 2021-10-08 DIAGNOSIS — N186 End stage renal disease: Secondary | ICD-10-CM | POA: Diagnosis not present

## 2021-10-08 DIAGNOSIS — D688 Other specified coagulation defects: Secondary | ICD-10-CM | POA: Diagnosis not present

## 2021-10-08 DIAGNOSIS — N2581 Secondary hyperparathyroidism of renal origin: Secondary | ICD-10-CM | POA: Diagnosis not present

## 2021-10-08 DIAGNOSIS — Z992 Dependence on renal dialysis: Secondary | ICD-10-CM | POA: Diagnosis not present

## 2021-10-08 DIAGNOSIS — Z23 Encounter for immunization: Secondary | ICD-10-CM | POA: Diagnosis not present

## 2021-10-09 ENCOUNTER — Telehealth: Payer: Medicare Other

## 2021-10-10 DIAGNOSIS — Z992 Dependence on renal dialysis: Secondary | ICD-10-CM | POA: Diagnosis not present

## 2021-10-10 DIAGNOSIS — N2581 Secondary hyperparathyroidism of renal origin: Secondary | ICD-10-CM | POA: Diagnosis not present

## 2021-10-10 DIAGNOSIS — G8929 Other chronic pain: Secondary | ICD-10-CM | POA: Diagnosis not present

## 2021-10-10 DIAGNOSIS — D688 Other specified coagulation defects: Secondary | ICD-10-CM | POA: Diagnosis not present

## 2021-10-10 DIAGNOSIS — Z23 Encounter for immunization: Secondary | ICD-10-CM | POA: Diagnosis not present

## 2021-10-10 DIAGNOSIS — N186 End stage renal disease: Secondary | ICD-10-CM | POA: Diagnosis not present

## 2021-10-10 DIAGNOSIS — E1129 Type 2 diabetes mellitus with other diabetic kidney complication: Secondary | ICD-10-CM | POA: Diagnosis not present

## 2021-10-12 ENCOUNTER — Other Ambulatory Visit (HOSPITAL_COMMUNITY): Payer: Self-pay

## 2021-10-12 DIAGNOSIS — N186 End stage renal disease: Secondary | ICD-10-CM | POA: Diagnosis not present

## 2021-10-12 DIAGNOSIS — Z992 Dependence on renal dialysis: Secondary | ICD-10-CM | POA: Diagnosis not present

## 2021-10-12 DIAGNOSIS — E1022 Type 1 diabetes mellitus with diabetic chronic kidney disease: Secondary | ICD-10-CM | POA: Diagnosis not present

## 2021-10-13 DIAGNOSIS — D688 Other specified coagulation defects: Secondary | ICD-10-CM | POA: Diagnosis not present

## 2021-10-13 DIAGNOSIS — G8929 Other chronic pain: Secondary | ICD-10-CM | POA: Diagnosis not present

## 2021-10-13 DIAGNOSIS — N2581 Secondary hyperparathyroidism of renal origin: Secondary | ICD-10-CM | POA: Diagnosis not present

## 2021-10-13 DIAGNOSIS — R519 Headache, unspecified: Secondary | ICD-10-CM | POA: Diagnosis not present

## 2021-10-13 DIAGNOSIS — N186 End stage renal disease: Secondary | ICD-10-CM | POA: Diagnosis not present

## 2021-10-13 DIAGNOSIS — Z992 Dependence on renal dialysis: Secondary | ICD-10-CM | POA: Diagnosis not present

## 2021-10-15 DIAGNOSIS — N186 End stage renal disease: Secondary | ICD-10-CM | POA: Diagnosis not present

## 2021-10-15 DIAGNOSIS — Z992 Dependence on renal dialysis: Secondary | ICD-10-CM | POA: Diagnosis not present

## 2021-10-15 DIAGNOSIS — D688 Other specified coagulation defects: Secondary | ICD-10-CM | POA: Diagnosis not present

## 2021-10-15 DIAGNOSIS — N2581 Secondary hyperparathyroidism of renal origin: Secondary | ICD-10-CM | POA: Diagnosis not present

## 2021-10-15 DIAGNOSIS — G8929 Other chronic pain: Secondary | ICD-10-CM | POA: Diagnosis not present

## 2021-10-15 DIAGNOSIS — R519 Headache, unspecified: Secondary | ICD-10-CM | POA: Diagnosis not present

## 2021-10-16 ENCOUNTER — Other Ambulatory Visit (HOSPITAL_COMMUNITY): Payer: Self-pay

## 2021-10-16 MED ORDER — LORAZEPAM 2 MG PO TABS
2.0000 mg | ORAL_TABLET | Freq: Every evening | ORAL | 0 refills | Status: DC | PRN
  Filled 2021-10-16: qty 90, 90d supply, fill #0

## 2021-10-16 MED ORDER — FLUOXETINE HCL 20 MG PO CAPS
20.0000 mg | ORAL_CAPSULE | Freq: Every morning | ORAL | 0 refills | Status: DC
  Filled 2021-10-16: qty 90, 90d supply, fill #0

## 2021-10-17 DIAGNOSIS — Z992 Dependence on renal dialysis: Secondary | ICD-10-CM | POA: Diagnosis not present

## 2021-10-17 DIAGNOSIS — G8929 Other chronic pain: Secondary | ICD-10-CM | POA: Diagnosis not present

## 2021-10-17 DIAGNOSIS — D688 Other specified coagulation defects: Secondary | ICD-10-CM | POA: Diagnosis not present

## 2021-10-17 DIAGNOSIS — N186 End stage renal disease: Secondary | ICD-10-CM | POA: Diagnosis not present

## 2021-10-17 DIAGNOSIS — N2581 Secondary hyperparathyroidism of renal origin: Secondary | ICD-10-CM | POA: Diagnosis not present

## 2021-10-17 DIAGNOSIS — R519 Headache, unspecified: Secondary | ICD-10-CM | POA: Diagnosis not present

## 2021-10-19 ENCOUNTER — Other Ambulatory Visit (HOSPITAL_COMMUNITY): Payer: Self-pay

## 2021-10-19 DIAGNOSIS — G894 Chronic pain syndrome: Secondary | ICD-10-CM | POA: Diagnosis not present

## 2021-10-20 DIAGNOSIS — Z992 Dependence on renal dialysis: Secondary | ICD-10-CM | POA: Diagnosis not present

## 2021-10-20 DIAGNOSIS — R519 Headache, unspecified: Secondary | ICD-10-CM | POA: Diagnosis not present

## 2021-10-20 DIAGNOSIS — N2581 Secondary hyperparathyroidism of renal origin: Secondary | ICD-10-CM | POA: Diagnosis not present

## 2021-10-20 DIAGNOSIS — G8929 Other chronic pain: Secondary | ICD-10-CM | POA: Diagnosis not present

## 2021-10-20 DIAGNOSIS — D688 Other specified coagulation defects: Secondary | ICD-10-CM | POA: Diagnosis not present

## 2021-10-20 DIAGNOSIS — N186 End stage renal disease: Secondary | ICD-10-CM | POA: Diagnosis not present

## 2021-10-22 DIAGNOSIS — Z992 Dependence on renal dialysis: Secondary | ICD-10-CM | POA: Diagnosis not present

## 2021-10-22 DIAGNOSIS — D688 Other specified coagulation defects: Secondary | ICD-10-CM | POA: Diagnosis not present

## 2021-10-22 DIAGNOSIS — G8929 Other chronic pain: Secondary | ICD-10-CM | POA: Diagnosis not present

## 2021-10-22 DIAGNOSIS — N2581 Secondary hyperparathyroidism of renal origin: Secondary | ICD-10-CM | POA: Diagnosis not present

## 2021-10-22 DIAGNOSIS — R519 Headache, unspecified: Secondary | ICD-10-CM | POA: Diagnosis not present

## 2021-10-22 DIAGNOSIS — N186 End stage renal disease: Secondary | ICD-10-CM | POA: Diagnosis not present

## 2021-10-23 ENCOUNTER — Other Ambulatory Visit (HOSPITAL_COMMUNITY): Payer: Self-pay

## 2021-10-24 DIAGNOSIS — Z992 Dependence on renal dialysis: Secondary | ICD-10-CM | POA: Diagnosis not present

## 2021-10-24 DIAGNOSIS — G8929 Other chronic pain: Secondary | ICD-10-CM | POA: Diagnosis not present

## 2021-10-24 DIAGNOSIS — D688 Other specified coagulation defects: Secondary | ICD-10-CM | POA: Diagnosis not present

## 2021-10-24 DIAGNOSIS — R519 Headache, unspecified: Secondary | ICD-10-CM | POA: Diagnosis not present

## 2021-10-24 DIAGNOSIS — N186 End stage renal disease: Secondary | ICD-10-CM | POA: Diagnosis not present

## 2021-10-24 DIAGNOSIS — N2581 Secondary hyperparathyroidism of renal origin: Secondary | ICD-10-CM | POA: Diagnosis not present

## 2021-10-27 DIAGNOSIS — N2581 Secondary hyperparathyroidism of renal origin: Secondary | ICD-10-CM | POA: Diagnosis not present

## 2021-10-27 DIAGNOSIS — R519 Headache, unspecified: Secondary | ICD-10-CM | POA: Diagnosis not present

## 2021-10-27 DIAGNOSIS — G8929 Other chronic pain: Secondary | ICD-10-CM | POA: Diagnosis not present

## 2021-10-27 DIAGNOSIS — Z992 Dependence on renal dialysis: Secondary | ICD-10-CM | POA: Diagnosis not present

## 2021-10-27 DIAGNOSIS — D688 Other specified coagulation defects: Secondary | ICD-10-CM | POA: Diagnosis not present

## 2021-10-27 DIAGNOSIS — N186 End stage renal disease: Secondary | ICD-10-CM | POA: Diagnosis not present

## 2021-10-29 DIAGNOSIS — N186 End stage renal disease: Secondary | ICD-10-CM | POA: Diagnosis not present

## 2021-10-29 DIAGNOSIS — D688 Other specified coagulation defects: Secondary | ICD-10-CM | POA: Diagnosis not present

## 2021-10-29 DIAGNOSIS — N2581 Secondary hyperparathyroidism of renal origin: Secondary | ICD-10-CM | POA: Diagnosis not present

## 2021-10-29 DIAGNOSIS — Z992 Dependence on renal dialysis: Secondary | ICD-10-CM | POA: Diagnosis not present

## 2021-10-29 DIAGNOSIS — G8929 Other chronic pain: Secondary | ICD-10-CM | POA: Diagnosis not present

## 2021-10-29 DIAGNOSIS — R519 Headache, unspecified: Secondary | ICD-10-CM | POA: Diagnosis not present

## 2021-10-30 ENCOUNTER — Other Ambulatory Visit: Payer: Self-pay

## 2021-10-30 ENCOUNTER — Telehealth: Payer: Self-pay | Admitting: Nurse Practitioner

## 2021-10-30 DIAGNOSIS — K529 Noninfective gastroenteritis and colitis, unspecified: Secondary | ICD-10-CM

## 2021-10-30 DIAGNOSIS — R1084 Generalized abdominal pain: Secondary | ICD-10-CM

## 2021-10-30 NOTE — Telephone Encounter (Signed)
Patient called requesting to speak with a nurse regarding really bad abdominal pain and would like to get some advise.

## 2021-10-30 NOTE — Telephone Encounter (Signed)
Patient advised of the recommendations and concerns.   CT enterography 11/09/21 arrive at 2:15 for a 4:30 appointment. NPO starting at 12:30 pm same day. Normal diet until 12:30 pm.  She expresses appreciation for Dr [facility] care.

## 2021-10-30 NOTE — Telephone Encounter (Signed)
Spoke with patient who asks to be called [name]. She reports upper abdominal pain that started after her dialysis on Tuesday. States it is very similar to the pain she had in August when she was hospitalized. She had a temp of 102 last night. Afebrile now. Eating makes the pain more intense. Pain is constant. Denies constipation. Dicyclomine makes her more comfortable but does not make the pain stop. She asks for Dr Blanch Media recommendations.

## 2021-10-30 NOTE — Telephone Encounter (Signed)
1.  On balance, I am concerned that she may be experiencing intermittent episodes of ischemia. 2.  Schedule CT enterography (with contrast, she is already on dialysis) "abdominal pain, evaluate small bowel, evaluate mesenteric vasculature" 3.  Her fever is worrisome 4.  If abdominal pain or fevers persist, return to the emergency room for full evaluation and to assess for the possible need for admission. 5.  If problems settle down, we will await the results of the CT enterography examination Please tell [name] (retired endoscopy nurse) that I said hello

## 2021-10-31 DIAGNOSIS — D688 Other specified coagulation defects: Secondary | ICD-10-CM | POA: Diagnosis not present

## 2021-10-31 DIAGNOSIS — N186 End stage renal disease: Secondary | ICD-10-CM | POA: Diagnosis not present

## 2021-10-31 DIAGNOSIS — G8929 Other chronic pain: Secondary | ICD-10-CM | POA: Diagnosis not present

## 2021-10-31 DIAGNOSIS — R519 Headache, unspecified: Secondary | ICD-10-CM | POA: Diagnosis not present

## 2021-10-31 DIAGNOSIS — N2581 Secondary hyperparathyroidism of renal origin: Secondary | ICD-10-CM | POA: Diagnosis not present

## 2021-10-31 DIAGNOSIS — Z992 Dependence on renal dialysis: Secondary | ICD-10-CM | POA: Diagnosis not present

## 2021-11-03 DIAGNOSIS — R519 Headache, unspecified: Secondary | ICD-10-CM | POA: Diagnosis not present

## 2021-11-03 DIAGNOSIS — G8929 Other chronic pain: Secondary | ICD-10-CM | POA: Diagnosis not present

## 2021-11-03 DIAGNOSIS — N186 End stage renal disease: Secondary | ICD-10-CM | POA: Diagnosis not present

## 2021-11-03 DIAGNOSIS — N2581 Secondary hyperparathyroidism of renal origin: Secondary | ICD-10-CM | POA: Diagnosis not present

## 2021-11-03 DIAGNOSIS — Z992 Dependence on renal dialysis: Secondary | ICD-10-CM | POA: Diagnosis not present

## 2021-11-03 DIAGNOSIS — D688 Other specified coagulation defects: Secondary | ICD-10-CM | POA: Diagnosis not present

## 2021-11-04 ENCOUNTER — Other Ambulatory Visit: Payer: Self-pay | Admitting: *Deleted

## 2021-11-04 MED ORDER — LIDOCAINE 5 % EX OINT: 1.0000 "application " | TOPICAL_OINTMENT | Freq: Three times a day (TID) | CUTANEOUS | 0 refills | Status: DC | PRN

## 2021-11-06 DIAGNOSIS — R519 Headache, unspecified: Secondary | ICD-10-CM | POA: Diagnosis not present

## 2021-11-06 DIAGNOSIS — G8929 Other chronic pain: Secondary | ICD-10-CM | POA: Diagnosis not present

## 2021-11-06 DIAGNOSIS — D688 Other specified coagulation defects: Secondary | ICD-10-CM | POA: Diagnosis not present

## 2021-11-06 DIAGNOSIS — N186 End stage renal disease: Secondary | ICD-10-CM | POA: Diagnosis not present

## 2021-11-06 DIAGNOSIS — Z992 Dependence on renal dialysis: Secondary | ICD-10-CM | POA: Diagnosis not present

## 2021-11-06 DIAGNOSIS — N2581 Secondary hyperparathyroidism of renal origin: Secondary | ICD-10-CM | POA: Diagnosis not present

## 2021-11-08 DIAGNOSIS — Z992 Dependence on renal dialysis: Secondary | ICD-10-CM | POA: Diagnosis not present

## 2021-11-08 DIAGNOSIS — R519 Headache, unspecified: Secondary | ICD-10-CM | POA: Diagnosis not present

## 2021-11-08 DIAGNOSIS — N2581 Secondary hyperparathyroidism of renal origin: Secondary | ICD-10-CM | POA: Diagnosis not present

## 2021-11-08 DIAGNOSIS — N186 End stage renal disease: Secondary | ICD-10-CM | POA: Diagnosis not present

## 2021-11-08 DIAGNOSIS — D688 Other specified coagulation defects: Secondary | ICD-10-CM | POA: Diagnosis not present

## 2021-11-08 DIAGNOSIS — G8929 Other chronic pain: Secondary | ICD-10-CM | POA: Diagnosis not present

## 2021-11-09 ENCOUNTER — Ambulatory Visit (HOSPITAL_COMMUNITY)
Admission: RE | Admit: 2021-11-09 | Discharge: 2021-11-09 | Disposition: A | Discharge status: Home / Self Care | Payer: Medicare Other | Source: Ambulatory Visit | Attending: Internal Medicine | Admitting: Internal Medicine

## 2021-11-09 ENCOUNTER — Other Ambulatory Visit: Payer: Self-pay

## 2021-11-09 ENCOUNTER — Encounter (HOSPITAL_COMMUNITY): Payer: Self-pay

## 2021-11-09 ENCOUNTER — Other Ambulatory Visit: Payer: Self-pay | Admitting: Internal Medicine

## 2021-11-09 DIAGNOSIS — R1084 Generalized abdominal pain: Secondary | ICD-10-CM | POA: Diagnosis not present

## 2021-11-09 DIAGNOSIS — K529 Noninfective gastroenteritis and colitis, unspecified: Secondary | ICD-10-CM | POA: Diagnosis not present

## 2021-11-09 DIAGNOSIS — R109 Unspecified abdominal pain: Secondary | ICD-10-CM | POA: Diagnosis not present

## 2021-11-09 MED ORDER — BARIUM SULFATE 0.1 % PO SUSP
ORAL | Status: AC
  Administered 2021-11-09: 15:00:00 1350 mL
  Filled 2021-11-09: qty 3

## 2021-11-09 MED ORDER — IOHEXOL 350 MG/ML SOLN
100.0000 mL | Freq: Once | INTRAVENOUS | Status: AC | PRN
  Administered 2021-11-09: 15:00:00 100 mL via INTRAVENOUS

## 2021-11-10 ENCOUNTER — Telehealth: Payer: Self-pay | Admitting: Internal Medicine

## 2021-11-10 ENCOUNTER — Other Ambulatory Visit: Payer: Self-pay

## 2021-11-10 ENCOUNTER — Other Ambulatory Visit (HOSPITAL_COMMUNITY): Payer: Self-pay

## 2021-11-10 DIAGNOSIS — N2581 Secondary hyperparathyroidism of renal origin: Secondary | ICD-10-CM | POA: Diagnosis not present

## 2021-11-10 DIAGNOSIS — N186 End stage renal disease: Secondary | ICD-10-CM | POA: Diagnosis not present

## 2021-11-10 DIAGNOSIS — R519 Headache, unspecified: Secondary | ICD-10-CM | POA: Diagnosis not present

## 2021-11-10 DIAGNOSIS — Z992 Dependence on renal dialysis: Secondary | ICD-10-CM | POA: Diagnosis not present

## 2021-11-10 DIAGNOSIS — G8929 Other chronic pain: Secondary | ICD-10-CM | POA: Diagnosis not present

## 2021-11-10 DIAGNOSIS — D688 Other specified coagulation defects: Secondary | ICD-10-CM | POA: Diagnosis not present

## 2021-11-10 MED ORDER — OXYCODONE HCL 15 MG PO TABS
15.0000 mg | ORAL_TABLET | Freq: Four times a day (QID) | ORAL | 0 refills | Status: DC | PRN
  Filled 2021-11-10: qty 98, 25d supply, fill #0
  Filled 2021-11-10: qty 22, 5d supply, fill #0

## 2021-11-10 NOTE — Telephone Encounter (Signed)
Patient called regarding the appointment that was set up with [name] on 12/6 at 9:20 a.m.  She is unable to come at that time because she is in dialysis during that time on Tuesdays and Thursdays.  She said she could come later that afternoon, but not at the 9:20 time.  Please call patient and advise.  Thank you.

## 2021-11-10 NOTE — Telephone Encounter (Signed)
Called patient and moved her office visit to 11/19/21 at 2:00 pm with Cottie Banda NP

## 2021-11-11 ENCOUNTER — Other Ambulatory Visit: Payer: Self-pay

## 2021-11-11 ENCOUNTER — Encounter: Payer: Self-pay | Admitting: Cardiovascular Disease

## 2021-11-11 ENCOUNTER — Ambulatory Visit (INDEPENDENT_AMBULATORY_CARE_PROVIDER_SITE_OTHER): Payer: Medicare Other | Admitting: Cardiovascular Disease

## 2021-11-11 VITALS — BP 110/74 | HR 88 | Ht 62.0 in | Wt 214.6 lb

## 2021-11-11 DIAGNOSIS — I700 Atherosclerosis of aorta: Secondary | ICD-10-CM | POA: Diagnosis not present

## 2021-11-11 DIAGNOSIS — Z992 Dependence on renal dialysis: Secondary | ICD-10-CM | POA: Diagnosis not present

## 2021-11-11 DIAGNOSIS — I2510 Atherosclerotic heart disease of native coronary artery without angina pectoris: Secondary | ICD-10-CM

## 2021-11-11 DIAGNOSIS — N186 End stage renal disease: Secondary | ICD-10-CM | POA: Diagnosis not present

## 2021-11-11 DIAGNOSIS — R0602 Shortness of breath: Secondary | ICD-10-CM | POA: Diagnosis not present

## 2021-11-11 DIAGNOSIS — I953 Hypotension of hemodialysis: Secondary | ICD-10-CM

## 2021-11-11 DIAGNOSIS — E1022 Type 1 diabetes mellitus with diabetic chronic kidney disease: Secondary | ICD-10-CM | POA: Diagnosis not present

## 2021-11-11 DIAGNOSIS — E1122 Type 2 diabetes mellitus with diabetic chronic kidney disease: Secondary | ICD-10-CM

## 2021-11-11 NOTE — Patient Instructions (Signed)
Medication Instructions:  No changes *If you need a refill on your cardiac medications before your next appointment, please call your pharmacy*   Lab Work: None ordered If you have labs (blood work) drawn today and your tests are completely normal, you will receive your results only by: [place] (if you have MyChart) OR A paper copy in the mail If you have any lab test that is abnormal or we need to change your treatment, we will call you to review the results.   Testing/Procedures: Your physician has requested that you have an echocardiogram. Echocardiography is a painless test that uses sound waves to create images of your heart. It provides your doctor with information about the size and shape of your heart and how well your heart's chambers and valves are working. You may receive an ultrasound enhancing agent through an IV if needed to better visualize your heart during the echo.This procedure takes approximately one hour. There are no restrictions for this procedure. This will take place at the [address].    Follow-Up: At [facility], you and your health needs are our priority.  As part of our continuing mission to provide you with exceptional heart care, we have created designated Provider Care Teams.  These Care Teams include your primary Cardiologist (physician) and Advanced Practice Providers (APPs -  Physician Assistants and Nurse Practitioners) who all work together to provide you with the care you need, when you need it.  We recommend signing up for the patient portal called "MyChart".  Sign up information is provided on this After Visit Summary.  MyChart is used to connect with patients for Virtual Visits (Telemedicine).  Patients are able to view lab/test results, encounter notes, upcoming appointments, etc.  Non-urgent messages can be sent to your provider as well.   To learn more about what you can do with MyChart, go to [email or web address].     Your next appointment:   6 month(s)  The format for your next appointment:   In Person  Provider:   Sanda Klein, MD

## 2021-11-11 NOTE — Progress Notes (Signed)
Cardiology Office Note:    Date:  11/15/2021   ID:  [name], DOB 1950-12-24, MRN [id number]  PCP:  Cassandria Anger, MD   [facility] Providers Cardiologist:  Sanda Klein, MD     Referring MD: [name], MD   No chief complaint on file. [name] is a 70 y.o. female who is being seen today for the evaluation of hypotension during hemodialysis at the request of [name], MD.   History of Present Illness:    [name] is a 70 y.o. female with a hx of end-stage renal disease (per patient report due to prolonged NSAID use for arthritis), type 2 diabetes mellitus also complicated by neuropathy and a variety of joint problems.  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  She has now been on hemodialysis for about a year and is having increasingly frequent problems with hypotension during dialysis.  She has been dialyzed through a right upper arm AV fistula, TTS schedule.  Her nephrologist is Dr. [name].  She still makes urine 2 or 3 times a day.  She reports that her "dry weight" is set at 96.5 kg.  It appears that she typically comes to dialysis with a weight of 99-101.2 kg, obviously higher on Tuesday.  She frequently has episodes of hypotension with blood pressures recorded as low as 70/30 doing her blood pressure typically rebounds this is stopped and she does not have hypotension in between dialysis sessions.  She wears compression stockings "all the time".  She is on an SSRI.  She is taking midodrine 10 mg before each hemodialysis and repeating the dose during hemodialysis.  A random a.m. cortisol checked in August was 12.9, making adrenal insufficiency less likely.  TSH was normal a year ago.  She has had a couple of episodes of abdominal pain that began during hemodialysis and the fear is that this may be a expression of low flow state.  On one occasion in August 2022 she also had fever, elevated WBC, CT showed  findings consistent with enteritis and she did receive antibiotics with some improvement. A second episode was not associated with any CT scanning changes: all  the previous inflammatory changes had resolved.  Incidental note was made of aortic atherosclerosis on the abdominal CT, but the second study was performed with intravenous contrast and showed "widely patent" patent celiac artery, superior mesenteric artery and inferior mesenteric artery.  A contrast CT of the chest following an accident in October showed that the coronary arteries, aortic arch and branch vessels have atherosclerotic disease.  She does not have angina pectoris during the episodes of hypotension or during daily activities.  She does not have palpitations dizziness or syncope outside of hemodialysis.  She does not have lower extremity edema and denies orthopnea or PND.  Family history significant for heart disease in her father who died in his 31s but her paternal grandfather died in his 26s.  She has a brother who had a myocardial infarction and has had 3 strokes, with onset in his late 61s.  Her most recent hemoglobin A1c at the beginning of this year was 6.0%, and in October was even better at 5.7%.  Recent hemoglobin was normal at 12.6.  She does not have hypothyroidism.  Liver function tests are normal.  Although [name] is interested in a possible kidney transplant, she tells me that her problems with hypotension requiring midodrine pretty  much exclude her from consideration.  Past Medical History:  Diagnosis Date   Anemia    Anxiety    Brain tumor (benign) ([place])    Colitis 2010   microscopic- Dr Henrene Pastor   Depression    Diabetes mellitus    type II   Dyspnea    with exertion   ESRD (end stage renal disease) ([place])    TTUSAT [address]    Fibromyalgia    GERD (gastroesophageal reflux disease)    Headache    History of blood transfusion    after knee surgery   Hypertension    discontinued all diuretics and  antihypertensives   IBS (irritable bowel syndrome)    LBP (low back pain)    Neuropathy    feet bilat    Osteoarthritis    Osteopenia    Pneumonia    hx of 2014    Rotator cuff tear, right    Sinusitis    currently being treated with antibiotic will complete 03/04/2015    Past Surgical History:  Procedure Laterality Date   AV FISTULA PLACEMENT Right 09/12/2020   Procedure: RIGHT ARTERIOVENOUS (AV) FISTULA CREATION;  Surgeon: Elam Dutch, MD;  Location: [place];  Service: Vascular;  Laterality: Right;   [place] Right 10/27/2020   Procedure: RIGHT UPPER EXTREMITY SECOND STAGE [place];  Surgeon: Elam Dutch, MD;  Location: [place];  Service: Vascular;  Laterality: Right;   foramen magnum ependymoma surgery  2003   Dr Rita Ohara   JOINT REPLACEMENT Bilateral    NASAL SINUS SURGERY     1973    TONSILLECTOMY     TOTAL KNEE ARTHROPLASTY     L 2008, R 2009, R 2016- Dr Maureen Ralphs   TOTAL KNEE REVISION Right 03/05/2015   Procedure: RIGHT TOTAL KNEE ARTHROPLASTY REVISION;  Surgeon: Gaynelle Arabian, MD;  Location: [facility];  Service: Orthopedics;  Laterality: Right;    Current Medications: Current Meds  Medication Sig   acetaminophen (TYLENOL) 500 MG tablet Take 1,000 mg by mouth 2 (two) times daily as needed for headache.   amoxicillin (AMOXIL) 500 MG capsule TAKE 4 CAPSULES BY MOUTH 1  HOUR PRIOR TO DENTAL  PROCEDURE/CLEANING (Patient taking differently: Take 2,000 mg by mouth See admin instructions. Take 4 capsules (2000 mg) by mouth one hour prior to dental procedure/cleaning)   aspirin-acetaminophen-caffeine (EXCEDRIN MIGRAINE) 250-250-65 MG tablet Take 1 tablet by mouth daily as needed for headache or migraine. Max 3 doses in one week   B Complex-C-Folic Acid (RENA-VITE PO) Take 1 tablet by mouth every morning.   blood glucose meter kit and supplies Dispense based on patient and insurance preference. Used to check blood sugar daily, DX:  E11.9 OneTouch   buPROPion (WELLBUTRIN XL) 300 MG 24 hr tablet Take 1 tablet (300 mg total) by mouth at bedtime.   carboxymethylcellulose (REFRESH PLUS) 0.5 % SOLN Place 1 drop into both eyes 3 (three) times daily as needed (dry eyes/irritation).   cyclobenzaprine (FLEXERIL) 10 MG tablet Take 1 tablet (10 mg total) by mouth 3 (three) times daily as needed for muscle spasms.   dicyclomine (BENTYL) 20 MG tablet Take one tablet every 4-6 hours as needed for abdominal pain (Patient taking differently: Take 20 mg by mouth every 4 (four) hours as needed (abdominal pain).)   docusate sodium (COLACE) 100 MG capsule Take 100 mg by mouth 2 (two) times daily.   FLUoxetine (PROZAC) 20 MG capsule Take 1 capsule (20 mg total) by mouth  in the morning.   FLUoxetine (PROZAC) 20 MG capsule Take 1 capsule (20 mg total) by mouth in the morning.   glimepiride (AMARYL) 1 MG tablet TAKE 1 TABLET BY MOUTH  DAILY BEFORE BREAKFAST   glucose blood (TRUETEST TEST) test strip Used to check blood sugar daily, DX: E11.9   ketoconazole (NIZORAL) 2 % cream APPLY TO THE AFFECTED AREA(S) BY TOPICAL ROUTE ONCE DAILY (Patient taking differently: Apply 1 application topically daily as needed for irritation (rash).)   ketoconazole (NIZORAL) 200 MG tablet Take 100 mg by mouth See admin instructions. Take 1/2 tablet (100 mg) by mouth daily for 30 days - as needed for yeast infection   Lancets (ONETOUCH ULTRASOFT) lancets Used to check blood sugar daily, DX: E11.9   lidocaine (XYLOCAINE) 5 % ointment Apply 1 application topically 3 (three) times daily as needed for moderate pain.   lidocaine-prilocaine (EMLA) cream Apply 1 application topically See admin instructions. Apply topically to port access one hour prior to dialysis, cover with plastic- Tuesday, Thursday, Saturday   loratadine (CLARITIN) 10 MG tablet Take 10 mg by mouth daily as needed for allergies.   LORazepam (ATIVAN) 2 MG tablet Take 1 tablet (2 mg total) by mouth at bedtime as  needed for insomnia   midodrine (PROAMATINE) 5 MG tablet Take 1-2 tablet by mouth three times a week as directed Take 30 minutes before dialysis and another tablet during middle of dialysis treatment (Patient taking differently: Take 5 mg by mouth See admin instructions. Take one tablet (5 mg) by mouth 30 minutes before dialysis (Tuesday, Thursday, Saturday); take one tablet (5 mg) during dialysis as needed for SBP <80)   multivitamin-lutein (OCUVITE-LUTEIN) CAPS capsule Take 1 capsule by mouth every morning.   oxyCODONE (ROXICODONE) 15 MG immediate release tablet Take 1 tablet (15 mg total) by mouth 4 (four) times daily as needed.   pantoprazole (PROTONIX) 40 MG tablet Take 1 tablet (40 mg total) by mouth daily.   pilocarpine (SALAGEN) 5 MG tablet Take 1 tablet (5 mg total) by mouth 2 (two) times daily. (Patient taking differently: Take 5 mg by mouth 2 (two) times daily. For dry mouth)   pioglitazone (ACTOS) 15 MG tablet Take 1 tablet (15 mg total) by mouth daily.   polyethylene glycol (MIRALAX / GLYCOLAX) 17 g packet Take 17 g by mouth See admin instructions. Mix 17 g powder in 1/2 cup liquid and drink by mouth on Tuesday and Saturday evenings   sevelamer carbonate (RENVELA) 800 MG tablet Take 800-1,600 mg by mouth See admin instructions. Take 2 tablets (1600 mg) by mouth three times daily with meals, take 1 tablet (800 mg) with snacks   triamcinolone (NASACORT) 55 MCG/ACT AERO nasal inhaler Place 2 sprays into the nose daily as needed (congestion).     Allergies:   Erythromycin and Gabapentin   Social History   Socioeconomic History   Marital status: Single    Spouse name: Not on file   Number of children: Not on file   Years of education: Not on file   Highest education level: Not on file  Occupational History   Not on file  Tobacco Use   Smoking status: Former    Packs/day: 1.00    Years: 20.00    Pack years: 20.00    Types: Cigarettes    Quit date: 12/13/1988    Years since  quitting: 32.9   Smokeless tobacco: Never  Vaping Use   Vaping Use: Never used  Substance and Sexual Activity  Alcohol use: Not Currently    Alcohol/week: 0.0 standard drinks    Comment: rarely   Drug use: No   Sexual activity: Not Currently  Other Topics Concern   Not on file  Social History Narrative   Not on file   Social Determinants of Health   Financial Resource Strain: Low Risk    Difficulty of Paying Living Expenses: Not hard at all  Food Insecurity: No Food Insecurity   Worried About [name] fundraiser in the Last Year: Never true   [place] in the Last Year: Never true  Transportation Needs: No Transportation Needs   Lack of Transportation (Medical): No   Lack of Transportation (Non-Medical): No  Physical Activity: Inactive   Days of Exercise per Week: 0 days   Minutes of Exercise per Session: 0 min  Stress: No Stress Concern Present   Feeling of Stress : Not at all  Social Connections: Unknown   Frequency of Communication with Friends and Family: More than three times a week   Frequency of Social Gatherings with Friends and Family: More than three times a week   Attends Religious Services: Patient refused   Marine scientist or Organizations: Patient refused   Attends Music therapist: Patient refused   Marital Status: Never married     Family History: The patient's family history includes Cancer in her brother; Crohn's disease in her maternal uncle; Depression in her mother; Diabetes in her brother, brother, father, mother, and another family member; Heart attack in her brother; Heart disease in her brother and brother; Hyperlipidemia in her brother, brother, and father; Hypertension in her brother, brother, father, and mother; Stroke in her brother; Stroke (age of onset: 66) in her mother. There is no history of Colon cancer.  ROS:   Please see the history of present illness.     All other systems reviewed and are  negative.  EKGs/Labs/Other Studies Reviewed:    The following studies were reviewed today: Notes from [facility] CT scans of the chest and abdomen performed in the last few months, significant for aortic atherosclerosis and coronary atherosclerosis, but patent mesenteric vessels  EKG:  EKG is  ordered today.  The ekg ordered today demonstrates normal sinus rhythm, normal tracing.  The QRS voltage is borderline low, but this could be attributed to obesity (BMI 39).  Recent Labs: 08/11/2021: Magnesium 2.4 09/14/2021: ALT 17; BUN 58; Creatinine, Ser 4.96; Hemoglobin 12.6; Platelets 263; Potassium 4.7; Sodium 136  Recent Lipid Panel    Component Value Date/Time   CHOL 215 (H) 09/25/2014 1601   TRIG 154.0 (H) 09/25/2014 1601   HDL 46.00 09/25/2014 1601   CHOLHDL 5 09/25/2014 1601   VLDL 30.8 09/25/2014 1601   LDLCALC 138 (H) 09/25/2014 1601     Risk Assessment/Calculations:           Physical Exam:    VS:  BP 110/74   Pulse 88   Ht _0  (1.575 m)   Wt 214 lb 9.6 oz (97.3 kg)   SpO2 96%   BMI 39.25 kg/m     Wt Readings from Last 3 Encounters:  11/11/21 214 lb 9.6 oz (97.3 kg)  09/14/21 215 lb (97.5 kg)  08/11/21 216 lb 0.8 oz (98 kg)     GEN: Severely obese, well nourished, well developed in no acute distress HEENT: Normal NECK: No JVD; No carotid bruits LYMPHATICS: No lymphadenopathy CARDIAC: RRR, no murmurs, rubs, gallops RESPIRATORY:  Clear  to auscultation without rales, wheezing or rhonchi  ABDOMEN: Soft, non-tender, non-distended MUSCULOSKELETAL:  No edema; No deformity  SKIN: Warm and dry NEUROLOGIC:  Alert and oriented x 3 PSYCHIATRIC:  Normal affect   ASSESSMENT:    1. Shortness of breath   2. Intra-dialytic hypotension   3. ESRD (end stage renal disease) on dialysis ([place])   4. Controlled type 2 diabetes mellitus with chronic kidney disease on chronic dialysis, without long-term current use of insulin ([place])   5. Atherosclerosis of aorta  ([place])   6. Coronary artery calcification seen on CT scan    PLAN:    In order of problems listed above:  Dialysis related hypotension: This is a very challenging problem.  This is most likely due to autonomic dysfunction, but she has not responded to conventional treatments.  She is already on high-dose midodrine and wears compression stockings.  I suggested that she try wearing her abdominal binder just during dialysis sessions as well.  We will check an echocardiogram.  Consider amyloidosis, especially if we see severe LVH on echo without corresponding high voltage on the EKG.  If that is the case, we will schedule for PYP scan.  Treatment options are quite limited.  Defer to [name] whether he thinks it would be safe to set a higher "dry weight", but her problems are during dialysis, rather than following dialysis.  Consider mineralocorticoid use, especially if amyloidosis is identified. DM: Very well controlled with very low doses of medication.  Only other diabetic complications neuropathy. Aortic and coronary atherosclerosis: There are no recent lipid profiles available for review.  A lipid panel from 2015 showed mild hypercholesterolemia with a total cholesterol 218, HDL 46, calculated LDL 138 and triglycerides 154.  Older lipid profiles were even more favorable with an LDL cholesterol in the 95-105 range and excellent HDL of 65.  As far as I can tell she has never had an assessment for coronary disease and has never had an echo.           Medication Adjustments/Labs and Tests Ordered: Current medicines are reviewed at length with the patient today.  Concerns regarding medicines are outlined above.  Orders Placed This Encounter  Procedures   EKG 12-Lead   ECHOCARDIOGRAM COMPLETE   No orders of the defined types were placed in this encounter.   Patient Instructions  Medication Instructions:  No changes *If you need a refill on your cardiac medications before your next  appointment, please call your pharmacy*   Lab Work: None ordered If you have labs (blood work) drawn today and your tests are completely normal, you will receive your results only by: [place] (if you have MyChart) OR A paper copy in the mail If you have any lab test that is abnormal or we need to change your treatment, we will call you to review the results.   Testing/Procedures: Your physician has requested that you have an echocardiogram. Echocardiography is a painless test that uses sound waves to create images of your heart. It provides your doctor with information about the size and shape of your heart and how well your heart's chambers and valves are working. You may receive an ultrasound enhancing agent through an IV if needed to better visualize your heart during the echo.This procedure takes approximately one hour. There are no restrictions for this procedure. This will take place at the [address].    Follow-Up: At [facility], you and your health needs are  our priority.  As part of our continuing mission to provide you with exceptional heart care, we have created designated Provider Care Teams.  These Care Teams include your primary Cardiologist (physician) and Advanced Practice Providers (APPs -  Physician Assistants and Nurse Practitioners) who all work together to provide you with the care you need, when you need it.  We recommend signing up for the patient portal called "MyChart".  Sign up information is provided on this After Visit Summary.  MyChart is used to connect with patients for Virtual Visits (Telemedicine).  Patients are able to view lab/test results, encounter notes, upcoming appointments, etc.  Non-urgent messages can be sent to your provider as well.   To learn more about what you can do with MyChart, go to [email or web address].    Your next appointment:   6 month(s)  The format for your next appointment:   In Person  Provider:    Sanda Klein, MD   Signed, Sanda Klein, MD  11/15/2021 3:41 PM    [place]

## 2021-11-12 DIAGNOSIS — Z23 Encounter for immunization: Secondary | ICD-10-CM | POA: Diagnosis not present

## 2021-11-12 DIAGNOSIS — G8929 Other chronic pain: Secondary | ICD-10-CM | POA: Diagnosis not present

## 2021-11-12 DIAGNOSIS — Z992 Dependence on renal dialysis: Secondary | ICD-10-CM | POA: Diagnosis not present

## 2021-11-12 DIAGNOSIS — N186 End stage renal disease: Secondary | ICD-10-CM | POA: Diagnosis not present

## 2021-11-12 DIAGNOSIS — D631 Anemia in chronic kidney disease: Secondary | ICD-10-CM | POA: Diagnosis not present

## 2021-11-12 DIAGNOSIS — N2581 Secondary hyperparathyroidism of renal origin: Secondary | ICD-10-CM | POA: Diagnosis not present

## 2021-11-12 DIAGNOSIS — D688 Other specified coagulation defects: Secondary | ICD-10-CM | POA: Diagnosis not present

## 2021-11-12 DIAGNOSIS — R52 Pain, unspecified: Secondary | ICD-10-CM | POA: Diagnosis not present

## 2021-11-12 DIAGNOSIS — R519 Headache, unspecified: Secondary | ICD-10-CM | POA: Diagnosis not present

## 2021-11-13 ENCOUNTER — Other Ambulatory Visit (HOSPITAL_COMMUNITY): Payer: Self-pay

## 2021-11-14 DIAGNOSIS — Z992 Dependence on renal dialysis: Secondary | ICD-10-CM | POA: Diagnosis not present

## 2021-11-14 DIAGNOSIS — N186 End stage renal disease: Secondary | ICD-10-CM | POA: Diagnosis not present

## 2021-11-14 DIAGNOSIS — R52 Pain, unspecified: Secondary | ICD-10-CM | POA: Diagnosis not present

## 2021-11-14 DIAGNOSIS — Z23 Encounter for immunization: Secondary | ICD-10-CM | POA: Diagnosis not present

## 2021-11-14 DIAGNOSIS — N2581 Secondary hyperparathyroidism of renal origin: Secondary | ICD-10-CM | POA: Diagnosis not present

## 2021-11-14 DIAGNOSIS — G8929 Other chronic pain: Secondary | ICD-10-CM | POA: Diagnosis not present

## 2021-11-14 DIAGNOSIS — D631 Anemia in chronic kidney disease: Secondary | ICD-10-CM | POA: Diagnosis not present

## 2021-11-14 DIAGNOSIS — R519 Headache, unspecified: Secondary | ICD-10-CM | POA: Diagnosis not present

## 2021-11-14 DIAGNOSIS — D688 Other specified coagulation defects: Secondary | ICD-10-CM | POA: Diagnosis not present

## 2021-11-15 ENCOUNTER — Encounter: Payer: Self-pay | Admitting: Cardiovascular Disease

## 2021-11-17 ENCOUNTER — Ambulatory Visit: Payer: Medicare Other | Admitting: Internal Medicine

## 2021-11-17 DIAGNOSIS — D631 Anemia in chronic kidney disease: Secondary | ICD-10-CM | POA: Diagnosis not present

## 2021-11-17 DIAGNOSIS — N186 End stage renal disease: Secondary | ICD-10-CM | POA: Diagnosis not present

## 2021-11-17 DIAGNOSIS — Z992 Dependence on renal dialysis: Secondary | ICD-10-CM | POA: Diagnosis not present

## 2021-11-17 DIAGNOSIS — R52 Pain, unspecified: Secondary | ICD-10-CM | POA: Diagnosis not present

## 2021-11-17 DIAGNOSIS — N2581 Secondary hyperparathyroidism of renal origin: Secondary | ICD-10-CM | POA: Diagnosis not present

## 2021-11-17 DIAGNOSIS — G8929 Other chronic pain: Secondary | ICD-10-CM | POA: Diagnosis not present

## 2021-11-17 DIAGNOSIS — Z23 Encounter for immunization: Secondary | ICD-10-CM | POA: Diagnosis not present

## 2021-11-17 DIAGNOSIS — R519 Headache, unspecified: Secondary | ICD-10-CM | POA: Diagnosis not present

## 2021-11-17 DIAGNOSIS — D688 Other specified coagulation defects: Secondary | ICD-10-CM | POA: Diagnosis not present

## 2021-11-18 ENCOUNTER — Other Ambulatory Visit: Payer: Self-pay

## 2021-11-18 DIAGNOSIS — M25572 Pain in left ankle and joints of left foot: Secondary | ICD-10-CM | POA: Diagnosis not present

## 2021-11-19 ENCOUNTER — Encounter: Payer: Self-pay | Admitting: Nurse Practitioner

## 2021-11-19 ENCOUNTER — Ambulatory Visit (INDEPENDENT_AMBULATORY_CARE_PROVIDER_SITE_OTHER): Payer: Medicare Other | Admitting: Nurse Practitioner

## 2021-11-19 VITALS — BP 108/58 | HR 88 | Ht 62.0 in | Wt 212.0 lb

## 2021-11-19 DIAGNOSIS — N186 End stage renal disease: Secondary | ICD-10-CM | POA: Diagnosis not present

## 2021-11-19 DIAGNOSIS — Z8619 Personal history of other infectious and parasitic diseases: Secondary | ICD-10-CM | POA: Diagnosis not present

## 2021-11-19 DIAGNOSIS — R519 Headache, unspecified: Secondary | ICD-10-CM | POA: Diagnosis not present

## 2021-11-19 DIAGNOSIS — R1084 Generalized abdominal pain: Secondary | ICD-10-CM | POA: Diagnosis not present

## 2021-11-19 DIAGNOSIS — R52 Pain, unspecified: Secondary | ICD-10-CM | POA: Diagnosis not present

## 2021-11-19 DIAGNOSIS — Z23 Encounter for immunization: Secondary | ICD-10-CM | POA: Diagnosis not present

## 2021-11-19 DIAGNOSIS — N2581 Secondary hyperparathyroidism of renal origin: Secondary | ICD-10-CM | POA: Diagnosis not present

## 2021-11-19 DIAGNOSIS — Z992 Dependence on renal dialysis: Secondary | ICD-10-CM | POA: Diagnosis not present

## 2021-11-19 DIAGNOSIS — D631 Anemia in chronic kidney disease: Secondary | ICD-10-CM | POA: Diagnosis not present

## 2021-11-19 DIAGNOSIS — G8929 Other chronic pain: Secondary | ICD-10-CM | POA: Diagnosis not present

## 2021-11-19 DIAGNOSIS — D688 Other specified coagulation defects: Secondary | ICD-10-CM | POA: Diagnosis not present

## 2021-11-19 NOTE — Patient Instructions (Signed)
If you are age 69 or older, your body mass index should be between 23-30. Your Body mass index is 38.78 kg/m. If this is out of the aforementioned range listed, please consider follow up with your Primary Care Provider.  The [place] [facility] providers would like to encourage you to use [facility] to communicate with providers for non-urgent requests or questions.  Due to long hold times on the telephone, sending your provider a message by [facility] may be faster and more efficient way to get a response. Please allow 48 business hours for a response.  Please remember that this is for non-urgent requests/questions.  RECOMMENDATIONS: Continue Dicyclomine 20 mg tablet twice a day as needed. Increase Miralax to once a day as tolerated. IBgard 1 capsule twice a day as needed for abdominal pain. We have given you some samples today to try. You can purchase more over the counter. Follow up as needed.  It was great seeing you today! Thank you for entrusting me with your care and choosing [facility].  Noralyn Pick, CRNP

## 2021-11-19 NOTE — Progress Notes (Signed)
.    11/19/2021 [name] [id number] 09-Dec-1951   Chief Complaint: Abdominal pain   History of Present Illness: [name] is a 70 year old female with a past medical history of anxiety, depression, hypertension, diabetes mellitus type 2, ESRD on hemodialysis TTS, chronic anemia, benign brain tumor, GERD, irritable bowel syndrome and microscopic colitis.  Refer to her last office visit 09/14/2021 for comprehensive history review. She presents to day for further follow up regarding abdominal pain. As previously reviewed, she was admitted to the hospital with lower abdominal pan following dialysis and a CTAP showed possible enteritis and she was treated with Augmentin and her abdominal pain abated. She contacted [name] 10/30/2021 with lower abdominal pain and fever of 102 which started after dialysis, similar to the abdominal pain she had when hospitalized 07/2021 which was concerning for mesenteric ischemia. A CT enterography was ordered and she was advised to go to the ED if her abdominal pain worsened. The CT enterography was done 11/09/2021 which showed a moderate amount of stool in the colon, prior small bowel inflammation resolved and patent abdominal vasculature, no evidence of mesenteric stenosis. A large fat containing umbilical hernia was noted.   She denies having any further significant abdominal pain. She sometimes has a few twinges of mid to lower abdominal discomfort. She is taking Miralax daily and Colace one cap bid. She goes 2 to 4 days without passing a BM. She passed a large formed BM today. No rectal bleeding or black stools. No epigastric pain at this time. No CP or SOB.    CT enterography with contrast 11/09/2021: Lower chest:  No acute findings.   Hepatobiliary: No masses or other significant abnormality.   Pancreas: No mass, inflammatory changes, or other significant abnormality.   Spleen: Within normal limits in size and appearance.   Adrenals/Urinary Tract:  Normal adrenal glands. Bilateral renal cortical volume loss is identified. Left kidney cyst arises off the lateral cortex of the interpolar left kidney measuring 3 cm, image 61/6. A scratch set multiple, additional too small to reliably characterize low-density kidney lesions are also noted arising off the upper and lower pole of the left kidney. No hydronephrosis or nephrolithiasis identified bilaterally. Urinary bladder appears normal.   Stomach/Bowel: Stomach appears normal. There is no small bowel wall thickening, inflammation, or distension. Terminal ileum is visualized and appears normal. The appendix is not confidently identified. Interval resolution of previously noted right lower quadrant enteritis. No bowel wall thickening, inflammation, or distension. Moderate retained stool is identified throughout the colon. Sigmoid diverticulosis without signs of acute diverticulitis.   Vascular/Lymphatic: Aortic atherosclerosis. Upper abdominal vasculature is widely patent including the celiac trunk and superior mesenteric artery. The IMA is also patent. Portal vein, splenic vein and superior mesenteric vein are all widely patent. Interval resolution of previously characterized right lower quadrant perienteric fat stranding.   Reproductive: Small calcified fibroid arises off the uterine fundus. No adnexal mass.   Other: Large umbilical hernia is identified containing fat only. This measures 8.6 cm in maximum dimension no free fluid or fluid collections.   Musculoskeletal: Degenerative disc disease identified within the lumbar spine. No acute or suspicious osseous findings. Subacute to chronic left eighth and ninth posterior rib fractures are again noted.   IMPRESSION: 1. No acute findings identified within the abdomen or pelvis. Interval resolution of previously characterized right lower quadrant enteritis with surrounding perienteric fat stranding. 2. Patent abdominal  vasculature 3. Large umbilical hernia contains fat only. 4. Aortic Atherosclerosis (ICD10-I70.0).  CBC Latest Ref Rng & Units 09/14/2021 08/11/2021 08/10/2021  WBC 4.0 - 10.5 K/uL 13.7(H) 9.9 8.5  Hemoglobin 12.0 - 15.0 g/dL 12.6 8.7(L) 9.0(L)  Hematocrit 36.0 - 46.0 % 40.1 26.7(L) 28.1(L)  Platelets 150 - 400 K/uL 263 191 175    CMP Latest Ref Rng & Units 09/14/2021 08/11/2021 08/10/2021  Glucose 70 - 99 mg/dL 145(H) 121(H) 79  BUN 8 - 23 mg/dL 58(H) 53(H) 46(H)  Creatinine 0.44 - 1.00 mg/dL 4.96(H) 5.49(H) 4.91(H)  Sodium 135 - 145 mmol/L 136 134(L) 133(L)  Potassium 3.5 - 5.1 mmol/L 4.7 3.5 3.6  Chloride 98 - 111 mmol/L 92(L) 97(L) 96(L)  CO2 22 - 32 mmol/L 24 24 21(L)  Calcium 8.9 - 10.3 mg/dL 10.6(H) 9.0 9.0  Total Protein 6.5 - 8.1 g/dL 8.0 5.5(L) 5.5(L)  Total Bilirubin 0.3 - 1.2 mg/dL 0.6 0.8 0.8  Alkaline Phos 38 - 126 U/L 107 118 110  AST 15 - 41 U/L 21 33 20  ALT 0 - 44 U/L 17 27 18      Current Medications, Allergies, Past Medical History, Past Surgical History, Family History and Social History were reviewed in [employer] record.  Review of Systems:   Constitutional: Negative for fever, sweats, chills or weight loss.  Respiratory: Negative for shortness of breath.   Cardiovascular: Negative for chest pain, palpitations and leg swelling.  Gastrointestinal: See HPI.  Musculoskeletal: Negative for back pain or muscle aches.  Neurological: + Balance issues.   Physical Exam: BP (!) 108/58   Pulse 88   Ht 5\' 2"  (1.575 m)   Wt 212 lb (96.2 kg)   BMI 38.78 kg/m  General: 70 year old female ambulates with the assistance of a cane in NAD.  Head: Normocephalic and atraumatic. Eyes: No scleral icterus. Conjunctiva pink . Ears: Normal auditory acuity. Lungs: Clear throughout to auscultation. Heart: Regular rate and rhythm, no murmur. Abdomen: Soft, nondistended. Mild epigastric tenderness without rebound or guarding. No masses or hepatomegaly.  Normal bowel sounds x 4 quadrants. Umbilical hernia. No bruits.  Rectal: Deferred.  Musculoskeletal: Symmetrical with no gross deformities. Extremities: No edema. Neurological: Alert oriented x 4. No focal deficits.  Psychological: Alert and cooperative. Normal mood and affect  Assessment and Recommendations: 20) 70 year old female with ESRD on HD who was admitted to the hospital 07/2021 with lower abdominal pain. CTAP 08/05/2021 showed evidence of enteritis which resolved after taking a course of Augmentin. She had recurrent lower abdominal pain following dialysis with a reported fever 10/30/2021. CT enterography 11/09/2021 showed her prior small bowel inflammation resolved and no evidence of mesenteric stenosis. She has constipation (no diarrhea) which is improving on Miralax daily. I suspect she has intradialytic hypotension due to intravascular volume depletion which may result in brief mesenteric ischemia resulting in abdominal pain.  -Follow up with nephrologist, consider altering dialysis ultrafiltration rate or increase dose of Midodrine prior to dialysis, reassess her dry weight?  -Consider mesenteric doppler study  -Continue Miralax QD -Ibgard 1 po bid as needed  -Patient to contact our office if her abdominal pain recurs   2) Epigastric pain, no complaints of recent epigastric pain, however, mild epigastric tenderness on exam today. -Continue Pantoprazole 40mg  QD and Famotidine 20mg  Q HS -Contact office if epigastric pain recurs   3) History of colon polyps.  Colonoscopy 01/2015 identified 2 polyps which were removed from the transverse and ascending colon but biopsy showed benign polypoid colonic mucosa. -Next colonoscopy due 01/2025 if medically appropriate at  that time.  4) Chronic anemia secondary to ESRD.  No overt GI bleeding.

## 2021-11-20 ENCOUNTER — Other Ambulatory Visit (HOSPITAL_COMMUNITY): Payer: Self-pay

## 2021-11-20 MED ORDER — LORAZEPAM 2 MG PO TABS
2.0000 mg | ORAL_TABLET | Freq: Every evening | ORAL | 1 refills | Status: DC | PRN
  Filled 2021-11-20: qty 90, 90d supply, fill #0

## 2021-11-20 MED ORDER — BUPROPION HCL ER (XL) 300 MG PO TB24
300.0000 mg | ORAL_TABLET | Freq: Every morning | ORAL | 3 refills | Status: DC
  Filled 2021-11-20: qty 30, 30d supply, fill #0

## 2021-11-20 MED ORDER — LORAZEPAM 2 MG PO TABS
2.0000 mg | ORAL_TABLET | Freq: Two times a day (BID) | ORAL | 3 refills | Status: DC | PRN
  Filled 2021-11-20 – 2021-12-08 (×2): qty 60, 30d supply, fill #0
  Filled 2022-01-07: qty 60, 30d supply, fill #1

## 2021-11-20 MED ORDER — FLUOXETINE HCL 20 MG PO CAPS
20.0000 mg | ORAL_CAPSULE | Freq: Every morning | ORAL | 1 refills | Status: DC
  Filled 2021-11-20 – 2022-01-07 (×3): qty 90, 90d supply, fill #0

## 2021-11-21 DIAGNOSIS — N2581 Secondary hyperparathyroidism of renal origin: Secondary | ICD-10-CM | POA: Diagnosis not present

## 2021-11-21 DIAGNOSIS — R52 Pain, unspecified: Secondary | ICD-10-CM | POA: Diagnosis not present

## 2021-11-21 DIAGNOSIS — N186 End stage renal disease: Secondary | ICD-10-CM | POA: Diagnosis not present

## 2021-11-21 DIAGNOSIS — G8929 Other chronic pain: Secondary | ICD-10-CM | POA: Diagnosis not present

## 2021-11-21 DIAGNOSIS — D688 Other specified coagulation defects: Secondary | ICD-10-CM | POA: Diagnosis not present

## 2021-11-21 DIAGNOSIS — Z992 Dependence on renal dialysis: Secondary | ICD-10-CM | POA: Diagnosis not present

## 2021-11-21 DIAGNOSIS — R519 Headache, unspecified: Secondary | ICD-10-CM | POA: Diagnosis not present

## 2021-11-21 DIAGNOSIS — Z23 Encounter for immunization: Secondary | ICD-10-CM | POA: Diagnosis not present

## 2021-11-21 DIAGNOSIS — D631 Anemia in chronic kidney disease: Secondary | ICD-10-CM | POA: Diagnosis not present

## 2021-11-23 ENCOUNTER — Other Ambulatory Visit: Payer: Self-pay | Admitting: Internal Medicine

## 2021-11-23 NOTE — Progress Notes (Signed)
Colleen, Agree with return to nephrology for the reasons outlined. Mesenteric dopplers not needed with patent vasculature on CT. Thanks.

## 2021-11-23 NOTE — Progress Notes (Signed)
Beth, pls contact the patient and have her follow up with her nephrologist to assess if she is having too low BP during dialysis which might be triggering her abdominal pain post dialysis. Her nephrologist might need to adjust her Midodrine dose.   Please fax a copy of my office note to her nephrologist so they can review my plan which explains my concerns. Thx

## 2021-11-24 ENCOUNTER — Telehealth: Payer: Self-pay

## 2021-11-24 DIAGNOSIS — D631 Anemia in chronic kidney disease: Secondary | ICD-10-CM | POA: Diagnosis not present

## 2021-11-24 DIAGNOSIS — D688 Other specified coagulation defects: Secondary | ICD-10-CM | POA: Diagnosis not present

## 2021-11-24 DIAGNOSIS — R52 Pain, unspecified: Secondary | ICD-10-CM | POA: Diagnosis not present

## 2021-11-24 DIAGNOSIS — Z23 Encounter for immunization: Secondary | ICD-10-CM | POA: Diagnosis not present

## 2021-11-24 DIAGNOSIS — N186 End stage renal disease: Secondary | ICD-10-CM | POA: Diagnosis not present

## 2021-11-24 DIAGNOSIS — R519 Headache, unspecified: Secondary | ICD-10-CM | POA: Diagnosis not present

## 2021-11-24 DIAGNOSIS — G8929 Other chronic pain: Secondary | ICD-10-CM | POA: Diagnosis not present

## 2021-11-24 DIAGNOSIS — Z992 Dependence on renal dialysis: Secondary | ICD-10-CM | POA: Diagnosis not present

## 2021-11-24 DIAGNOSIS — N2581 Secondary hyperparathyroidism of renal origin: Secondary | ICD-10-CM | POA: Diagnosis not present

## 2021-11-24 NOTE — Telephone Encounter (Signed)
Spoke with Tye Maryland, Therapist, sports at [facility].  Office note from 11/19/21 faxed for the Nephrologist to review tomorrow. " have her follow up with her nephrologist to assess if she is having too low BP during dialysis which might be triggering her abdominal pain post dialysis." Patient is aware of the recommendations and plan of care. Understands the decision to change the dosage of Midodrine is for her nephrologist to make.

## 2021-11-26 DIAGNOSIS — Z23 Encounter for immunization: Secondary | ICD-10-CM | POA: Diagnosis not present

## 2021-11-26 DIAGNOSIS — Z992 Dependence on renal dialysis: Secondary | ICD-10-CM | POA: Diagnosis not present

## 2021-11-26 DIAGNOSIS — R52 Pain, unspecified: Secondary | ICD-10-CM | POA: Diagnosis not present

## 2021-11-26 DIAGNOSIS — G8929 Other chronic pain: Secondary | ICD-10-CM | POA: Diagnosis not present

## 2021-11-26 DIAGNOSIS — N2581 Secondary hyperparathyroidism of renal origin: Secondary | ICD-10-CM | POA: Diagnosis not present

## 2021-11-26 DIAGNOSIS — D631 Anemia in chronic kidney disease: Secondary | ICD-10-CM | POA: Diagnosis not present

## 2021-11-26 DIAGNOSIS — N186 End stage renal disease: Secondary | ICD-10-CM | POA: Diagnosis not present

## 2021-11-26 DIAGNOSIS — D688 Other specified coagulation defects: Secondary | ICD-10-CM | POA: Diagnosis not present

## 2021-11-26 DIAGNOSIS — R519 Headache, unspecified: Secondary | ICD-10-CM | POA: Diagnosis not present

## 2021-11-28 DIAGNOSIS — G8929 Other chronic pain: Secondary | ICD-10-CM | POA: Diagnosis not present

## 2021-11-28 DIAGNOSIS — Z992 Dependence on renal dialysis: Secondary | ICD-10-CM | POA: Diagnosis not present

## 2021-11-28 DIAGNOSIS — D688 Other specified coagulation defects: Secondary | ICD-10-CM | POA: Diagnosis not present

## 2021-11-28 DIAGNOSIS — N2581 Secondary hyperparathyroidism of renal origin: Secondary | ICD-10-CM | POA: Diagnosis not present

## 2021-11-28 DIAGNOSIS — Z23 Encounter for immunization: Secondary | ICD-10-CM | POA: Diagnosis not present

## 2021-11-28 DIAGNOSIS — N186 End stage renal disease: Secondary | ICD-10-CM | POA: Diagnosis not present

## 2021-11-28 DIAGNOSIS — R52 Pain, unspecified: Secondary | ICD-10-CM | POA: Diagnosis not present

## 2021-11-28 DIAGNOSIS — R519 Headache, unspecified: Secondary | ICD-10-CM | POA: Diagnosis not present

## 2021-11-28 DIAGNOSIS — D631 Anemia in chronic kidney disease: Secondary | ICD-10-CM | POA: Diagnosis not present

## 2021-12-01 DIAGNOSIS — D688 Other specified coagulation defects: Secondary | ICD-10-CM | POA: Diagnosis not present

## 2021-12-01 DIAGNOSIS — N186 End stage renal disease: Secondary | ICD-10-CM | POA: Diagnosis not present

## 2021-12-01 DIAGNOSIS — N2581 Secondary hyperparathyroidism of renal origin: Secondary | ICD-10-CM | POA: Diagnosis not present

## 2021-12-01 DIAGNOSIS — Z992 Dependence on renal dialysis: Secondary | ICD-10-CM | POA: Diagnosis not present

## 2021-12-03 DIAGNOSIS — N186 End stage renal disease: Secondary | ICD-10-CM | POA: Diagnosis not present

## 2021-12-03 DIAGNOSIS — D688 Other specified coagulation defects: Secondary | ICD-10-CM | POA: Diagnosis not present

## 2021-12-03 DIAGNOSIS — N2581 Secondary hyperparathyroidism of renal origin: Secondary | ICD-10-CM | POA: Diagnosis not present

## 2021-12-03 DIAGNOSIS — Z992 Dependence on renal dialysis: Secondary | ICD-10-CM | POA: Diagnosis not present

## 2021-12-05 DIAGNOSIS — N2581 Secondary hyperparathyroidism of renal origin: Secondary | ICD-10-CM | POA: Diagnosis not present

## 2021-12-05 DIAGNOSIS — N186 End stage renal disease: Secondary | ICD-10-CM | POA: Diagnosis not present

## 2021-12-05 DIAGNOSIS — Z992 Dependence on renal dialysis: Secondary | ICD-10-CM | POA: Diagnosis not present

## 2021-12-05 DIAGNOSIS — D688 Other specified coagulation defects: Secondary | ICD-10-CM | POA: Diagnosis not present

## 2021-12-08 ENCOUNTER — Other Ambulatory Visit (HOSPITAL_COMMUNITY): Payer: Self-pay

## 2021-12-08 DIAGNOSIS — N186 End stage renal disease: Secondary | ICD-10-CM | POA: Diagnosis not present

## 2021-12-08 DIAGNOSIS — Z23 Encounter for immunization: Secondary | ICD-10-CM | POA: Diagnosis not present

## 2021-12-08 DIAGNOSIS — R52 Pain, unspecified: Secondary | ICD-10-CM | POA: Diagnosis not present

## 2021-12-08 DIAGNOSIS — D631 Anemia in chronic kidney disease: Secondary | ICD-10-CM | POA: Diagnosis not present

## 2021-12-08 DIAGNOSIS — Z992 Dependence on renal dialysis: Secondary | ICD-10-CM | POA: Diagnosis not present

## 2021-12-08 DIAGNOSIS — R519 Headache, unspecified: Secondary | ICD-10-CM | POA: Diagnosis not present

## 2021-12-08 DIAGNOSIS — N2581 Secondary hyperparathyroidism of renal origin: Secondary | ICD-10-CM | POA: Diagnosis not present

## 2021-12-08 DIAGNOSIS — G8929 Other chronic pain: Secondary | ICD-10-CM | POA: Diagnosis not present

## 2021-12-08 DIAGNOSIS — D688 Other specified coagulation defects: Secondary | ICD-10-CM | POA: Diagnosis not present

## 2021-12-09 ENCOUNTER — Other Ambulatory Visit (HOSPITAL_COMMUNITY): Payer: Self-pay

## 2021-12-09 ENCOUNTER — Other Ambulatory Visit: Payer: Self-pay

## 2021-12-09 ENCOUNTER — Ambulatory Visit (HOSPITAL_COMMUNITY): Payer: Medicare Other | Attending: Cardiology

## 2021-12-09 DIAGNOSIS — R0602 Shortness of breath: Secondary | ICD-10-CM | POA: Insufficient documentation

## 2021-12-09 LAB — ECHOCARDIOGRAM COMPLETE
Area-P 1/2: 5.42 cm2
S' Lateral: 2.1 cm

## 2021-12-09 MED ORDER — OXYCODONE HCL 15 MG PO TABS
15.0000 mg | ORAL_TABLET | Freq: Four times a day (QID) | ORAL | 0 refills | Status: DC | PRN
  Filled 2021-12-09: qty 120, 30d supply, fill #0

## 2021-12-10 DIAGNOSIS — Z23 Encounter for immunization: Secondary | ICD-10-CM | POA: Diagnosis not present

## 2021-12-10 DIAGNOSIS — G8929 Other chronic pain: Secondary | ICD-10-CM | POA: Diagnosis not present

## 2021-12-10 DIAGNOSIS — R519 Headache, unspecified: Secondary | ICD-10-CM | POA: Diagnosis not present

## 2021-12-10 DIAGNOSIS — Z992 Dependence on renal dialysis: Secondary | ICD-10-CM | POA: Diagnosis not present

## 2021-12-10 DIAGNOSIS — D631 Anemia in chronic kidney disease: Secondary | ICD-10-CM | POA: Diagnosis not present

## 2021-12-10 DIAGNOSIS — N2581 Secondary hyperparathyroidism of renal origin: Secondary | ICD-10-CM | POA: Diagnosis not present

## 2021-12-10 DIAGNOSIS — D688 Other specified coagulation defects: Secondary | ICD-10-CM | POA: Diagnosis not present

## 2021-12-10 DIAGNOSIS — N186 End stage renal disease: Secondary | ICD-10-CM | POA: Diagnosis not present

## 2021-12-10 DIAGNOSIS — R52 Pain, unspecified: Secondary | ICD-10-CM | POA: Diagnosis not present

## 2021-12-12 DIAGNOSIS — G8929 Other chronic pain: Secondary | ICD-10-CM | POA: Diagnosis not present

## 2021-12-12 DIAGNOSIS — N2581 Secondary hyperparathyroidism of renal origin: Secondary | ICD-10-CM | POA: Diagnosis not present

## 2021-12-12 DIAGNOSIS — R519 Headache, unspecified: Secondary | ICD-10-CM | POA: Diagnosis not present

## 2021-12-12 DIAGNOSIS — N186 End stage renal disease: Secondary | ICD-10-CM | POA: Diagnosis not present

## 2021-12-12 DIAGNOSIS — E1022 Type 1 diabetes mellitus with diabetic chronic kidney disease: Secondary | ICD-10-CM | POA: Diagnosis not present

## 2021-12-12 DIAGNOSIS — Z23 Encounter for immunization: Secondary | ICD-10-CM | POA: Diagnosis not present

## 2021-12-12 DIAGNOSIS — D631 Anemia in chronic kidney disease: Secondary | ICD-10-CM | POA: Diagnosis not present

## 2021-12-12 DIAGNOSIS — Z992 Dependence on renal dialysis: Secondary | ICD-10-CM | POA: Diagnosis not present

## 2021-12-12 DIAGNOSIS — R52 Pain, unspecified: Secondary | ICD-10-CM | POA: Diagnosis not present

## 2021-12-12 DIAGNOSIS — D688 Other specified coagulation defects: Secondary | ICD-10-CM | POA: Diagnosis not present

## 2021-12-15 DIAGNOSIS — D631 Anemia in chronic kidney disease: Secondary | ICD-10-CM | POA: Diagnosis not present

## 2021-12-15 DIAGNOSIS — E1129 Type 2 diabetes mellitus with other diabetic kidney complication: Secondary | ICD-10-CM | POA: Diagnosis not present

## 2021-12-15 DIAGNOSIS — N186 End stage renal disease: Secondary | ICD-10-CM | POA: Diagnosis not present

## 2021-12-15 DIAGNOSIS — D688 Other specified coagulation defects: Secondary | ICD-10-CM | POA: Diagnosis not present

## 2021-12-15 DIAGNOSIS — N2581 Secondary hyperparathyroidism of renal origin: Secondary | ICD-10-CM | POA: Diagnosis not present

## 2021-12-15 DIAGNOSIS — Z992 Dependence on renal dialysis: Secondary | ICD-10-CM | POA: Diagnosis not present

## 2021-12-16 DIAGNOSIS — M25811 Other specified joint disorders, right shoulder: Secondary | ICD-10-CM | POA: Diagnosis not present

## 2021-12-17 DIAGNOSIS — N2581 Secondary hyperparathyroidism of renal origin: Secondary | ICD-10-CM | POA: Diagnosis not present

## 2021-12-17 DIAGNOSIS — E1129 Type 2 diabetes mellitus with other diabetic kidney complication: Secondary | ICD-10-CM | POA: Diagnosis not present

## 2021-12-17 DIAGNOSIS — Z992 Dependence on renal dialysis: Secondary | ICD-10-CM | POA: Diagnosis not present

## 2021-12-17 DIAGNOSIS — D631 Anemia in chronic kidney disease: Secondary | ICD-10-CM | POA: Diagnosis not present

## 2021-12-17 DIAGNOSIS — D688 Other specified coagulation defects: Secondary | ICD-10-CM | POA: Diagnosis not present

## 2021-12-17 DIAGNOSIS — N186 End stage renal disease: Secondary | ICD-10-CM | POA: Diagnosis not present

## 2021-12-18 ENCOUNTER — Telehealth: Payer: Self-pay | Admitting: *Deleted

## 2021-12-18 NOTE — Telephone Encounter (Signed)
° °  Pre-operative Risk Assessment    Patient Name: [name]  DOB: 03-Dec-1951 MRN: [id number]      Request for Surgical Clearance    Procedure:   RIGHT REVERSE SHOULDER ARTHROPLASTY   Date of Surgery:  Clearance TBD                                 Surgeon:  DR Lennette Bihari SUPPLE Surgeon's Group or Practice Name:  [facility] Phone number:  [phone] Fax number:  [phone]   Type of Clearance Requested:   - Medical    Type of Anesthesia:  General    Additional requests/questions:  Please advise surgeon/provider what medications should be held.  Olin Pia   12/18/2021, 4:58 PM

## 2021-12-19 DIAGNOSIS — D631 Anemia in chronic kidney disease: Secondary | ICD-10-CM | POA: Diagnosis not present

## 2021-12-19 DIAGNOSIS — E1129 Type 2 diabetes mellitus with other diabetic kidney complication: Secondary | ICD-10-CM | POA: Diagnosis not present

## 2021-12-19 DIAGNOSIS — N2581 Secondary hyperparathyroidism of renal origin: Secondary | ICD-10-CM | POA: Diagnosis not present

## 2021-12-19 DIAGNOSIS — Z992 Dependence on renal dialysis: Secondary | ICD-10-CM | POA: Diagnosis not present

## 2021-12-19 DIAGNOSIS — N186 End stage renal disease: Secondary | ICD-10-CM | POA: Diagnosis not present

## 2021-12-19 DIAGNOSIS — D688 Other specified coagulation defects: Secondary | ICD-10-CM | POA: Diagnosis not present

## 2021-12-21 ENCOUNTER — Encounter: Payer: Self-pay | Admitting: Internal Medicine

## 2021-12-21 ENCOUNTER — Other Ambulatory Visit: Payer: Self-pay

## 2021-12-21 ENCOUNTER — Ambulatory Visit (INDEPENDENT_AMBULATORY_CARE_PROVIDER_SITE_OTHER): Payer: Medicare Other | Admitting: Internal Medicine

## 2021-12-21 DIAGNOSIS — M25511 Pain in right shoulder: Secondary | ICD-10-CM

## 2021-12-21 DIAGNOSIS — I10 Essential (primary) hypertension: Secondary | ICD-10-CM

## 2021-12-21 DIAGNOSIS — N186 End stage renal disease: Secondary | ICD-10-CM

## 2021-12-21 DIAGNOSIS — Z992 Dependence on renal dialysis: Secondary | ICD-10-CM | POA: Diagnosis not present

## 2021-12-21 DIAGNOSIS — I9589 Other hypotension: Secondary | ICD-10-CM

## 2021-12-21 DIAGNOSIS — E861 Hypovolemia: Secondary | ICD-10-CM | POA: Diagnosis not present

## 2021-12-21 DIAGNOSIS — I959 Hypotension, unspecified: Secondary | ICD-10-CM | POA: Insufficient documentation

## 2021-12-21 NOTE — Assessment & Plan Note (Addendum)
Worse Pt is planning to have a shoulder arthroplasty on the R  - Dr Onnie Graham (fistula is on the R too)

## 2021-12-21 NOTE — Assessment & Plan Note (Signed)
Low BP w/HD F/u w/Dr Moshe Cipro

## 2021-12-21 NOTE — Assessment & Plan Note (Signed)
Low BP - on Midodrin on HD days 

## 2021-12-21 NOTE — Telephone Encounter (Signed)
° °  Primary Cardiologist: Sanda Klein, MD  Chart reviewed as part of pre-operative protocol coverage. Given past medical history and time since last visit, based on ACC/AHA guidelines, [name] would be at acceptable risk for the planned procedure without further cardiovascular testing.   Her RCRI is a class II risk, 0.9% risk of major cardiac event.  I will route this recommendation to the requesting party via Epic fax function and remove from pre-op pool.  Please call with questions.  Jossie Ng. Adrina Armijo NP-C    12/21/2021, 8:29 AM [place] [address] Office [phone] Fax [phone]

## 2021-12-21 NOTE — Progress Notes (Signed)
° °Subjective:  °Patient ID: [name], female    DOB: 06/11/1951  Age: 71 y.o. MRN: [id number] ° °CC: Follow-up (6 month f/u) ° ° °HPI °[name] presents for depression, ESRD on HD, ?bowel ischemia, HTN °Planning to have a shoulder arthroplasty on the R  - Dr Supple (fistula is on the R too) ° °Outpatient Medications Prior to Visit  °Medication Sig Dispense Refill  ° acetaminophen (TYLENOL) 500 MG tablet Take 1,000 mg by mouth 2 (two) times daily as needed for headache.    ° aspirin-acetaminophen-caffeine (EXCEDRIN MIGRAINE) 250-250-65 MG tablet Take 1 tablet by mouth daily as needed for headache or migraine. Max 3 doses in one week    ° B Complex-C-Folic Acid (RENA-VITE PO) Take 1 tablet by mouth every morning.    ° blood glucose meter kit and supplies Dispense based on patient and insurance preference. Used to check blood sugar daily, DX: E11.9 °OneTouch 1 each 0  ° buPROPion (WELLBUTRIN XL) 300 MG 24 hr tablet Take 1 tablet (300 mg total) by mouth in the morning. 30 tablet 3  ° carboxymethylcellulose (REFRESH PLUS) 0.5 % SOLN Place 1 drop into both eyes 3 (three) times daily as needed (dry eyes/irritation).    ° cyclobenzaprine (FLEXERIL) 10 MG tablet Take 1 tablet (10 mg total) by mouth 3 (three) times daily as needed for muscle spasms. 270 tablet 0  ° dicyclomine (BENTYL) 20 MG tablet Take one tablet every 4-6 hours as needed for abdominal pain (Patient taking differently: Take 20 mg by mouth every 4 (four) hours as needed (abdominal pain).) 60 tablet 2  ° docusate sodium (COLACE) 100 MG capsule Take 100 mg by mouth 2 (two) times daily.    ° FLUoxetine (PROZAC) 20 MG capsule Take 1 capsule (20 mg total) by mouth every morning. 90 capsule 1  ° glimepiride (AMARYL) 1 MG tablet TAKE 1 TABLET BY MOUTH  DAILY BEFORE BREAKFAST 90 tablet 3  ° glucose blood (TRUETEST TEST) test strip Used to check blood sugar daily, DX: E11.9 100 each 3  ° lidocaine (XYLOCAINE) 5 % ointment Apply 1 application topically 3  (three) times daily as needed for moderate pain. 90 g 0  ° lidocaine-prilocaine (EMLA) cream Apply 1 application topically See admin instructions. Apply topically to port access one hour prior to dialysis, cover with plastic- Tuesday, Thursday, Saturday    ° loratadine (CLARITIN) 10 MG tablet Take 10 mg by mouth daily as needed for allergies.    ° LORazepam (ATIVAN) 2 MG tablet Take 1 tablet (2 mg total) by mouth 2 (two) times daily as needed for anxiety 60 tablet 3  ° midodrine (PROAMATINE) 5 MG tablet Take 1-2 tablet by mouth three times a week as directed Take 30 minutes before dialysis and another tablet during middle of dialysis treatment (Patient taking differently: Take 5 mg by mouth See admin instructions. Take one tablet (5 mg) by mouth 30 minutes before dialysis (Tuesday, Thursday, Saturday); take one tablet (5 mg) during dialysis as needed for SBP <80) 90 tablet 3  ° multivitamin-lutein (OCUVITE-LUTEIN) CAPS capsule Take 1 capsule by mouth every morning.    ° oxyCODONE (ROXICODONE) 15 MG immediate release tablet Take 1 tablet (15 mg total) by mouth 4 (four) times daily as needed. 120 tablet 0  ° pantoprazole (PROTONIX) 40 MG tablet Take 1 tablet (40 mg total) by mouth daily. 90 tablet 3  ° pilocarpine (SALAGEN) 5 MG tablet TAKE 1 TABLET BY MOUTH  TWICE DAILY 180 tablet   tablet 2   pioglitazone (ACTOS) 15 MG tablet Take 1 tablet (15 mg total) by mouth daily. 90 tablet 3   polyethylene glycol (MIRALAX / GLYCOLAX) 17 g packet Take 17 g by mouth See admin instructions. Mix 17 g powder in 1/2 cup liquid and drink by mouth on Tuesday and Saturday evenings     sevelamer carbonate (RENVELA) 800 MG tablet Take 800-1,600 mg by mouth See admin instructions. Take 2 tablets (1600 mg) by mouth three times daily with meals, take 1 tablet (800 mg) with snacks     amoxicillin (AMOXIL) 500 MG capsule TAKE 4 CAPSULES BY MOUTH 1  HOUR PRIOR TO DENTAL  PROCEDURE/CLEANING (Patient not taking: Reported on 12/21/2021) 16 capsule 1    ketoconazole (NIZORAL) 200 MG tablet Take 100 mg by mouth See admin instructions. Take 1/2 tablet (100 mg) by mouth daily for 30 days - as needed for yeast infection (Patient not taking: Reported on 12/21/2021)     triamcinolone (NASACORT) 55 MCG/ACT AERO nasal inhaler Place 2 sprays into the nose daily as needed (congestion). (Patient not taking: Reported on 12/21/2021)     buPROPion (WELLBUTRIN XL) 300 MG 24 hr tablet Take 1 tablet (300 mg total) by mouth at bedtime. (Patient not taking: Reported on 12/21/2021) 90 tablet 3   Lancets (ONETOUCH ULTRASOFT) lancets Used to check blood sugar daily, DX: E11.9 100 each 3   LORazepam (ATIVAN) 2 MG tablet Take 1 tablet (2 mg total) by mouth at bedtime as needed for insomnia 90 tablet 2   LORazepam (ATIVAN) 2 MG tablet Take 1 tablet (2 mg total) by mouth at bedtime as needed. 90 tablet 1   No facility-administered medications prior to visit.    ROS: Review of Systems  Constitutional:  Positive for fatigue. Negative for activity change, appetite change, chills and unexpected weight change.  HENT:  Negative for congestion, mouth sores and sinus pressure.   Eyes:  Negative for visual disturbance.  Respiratory:  Negative for cough and chest tightness.   Gastrointestinal:  Negative for abdominal pain and nausea.  Genitourinary:  Negative for difficulty urinating, frequency and vaginal pain.  Musculoskeletal:  Positive for arthralgias, back pain and gait problem.  Skin:  Negative for pallor and rash.  Neurological:  Negative for dizziness, tremors, weakness, numbness and headaches.  Psychiatric/Behavioral:  Positive for dysphoric mood. Negative for confusion, sleep disturbance and suicidal ideas. The patient is nervous/anxious.    Objective:  BP (!) 122/58 (BP Location: Left Arm)    Pulse 86    Temp 98.1 F (36.7 C) (Oral)    Ht 5' 2" (1.575 m)    Wt 221 lb (100.2 kg)    SpO2 97%    BMI 40.42 kg/m   BP Readings from Last 3 Encounters:  12/21/21 (!) 122/58   11/19/21 (!) 108/58  11/11/21 110/74    Wt Readings from Last 3 Encounters:  12/21/21 221 lb (100.2 kg)  11/19/21 212 lb (96.2 kg)  11/11/21 214 lb 9.6 oz (97.3 kg)    Physical Exam Constitutional:      General: She is not in acute distress.    Appearance: She is well-developed. She is obese.  HENT:     Head: Normocephalic.     Right Ear: External ear normal.     Left Ear: External ear normal.     Nose: Nose normal.  Eyes:     General:        Right eye: No discharge.  Left eye: No discharge.     Conjunctiva/sclera: Conjunctivae normal.     Pupils: Pupils are equal, round, and reactive to light.  Neck:     Thyroid: No thyromegaly.     Vascular: No JVD.     Trachea: No tracheal deviation.  Cardiovascular:     Rate and Rhythm: Normal rate and regular rhythm.     Heart sounds: Normal heart sounds.  Pulmonary:     Effort: No respiratory distress.     Breath sounds: No stridor. No wheezing.  Abdominal:     General: Bowel sounds are normal. There is no distension.     Palpations: Abdomen is soft. There is no mass.     Tenderness: There is no abdominal tenderness. There is no guarding or rebound.  Musculoskeletal:        General: Tenderness present.     Cervical back: Normal range of motion and neck supple. No rigidity.  Lymphadenopathy:     Cervical: No cervical adenopathy.  Skin:    Findings: No erythema or rash.  Neurological:     Cranial Nerves: No cranial nerve deficit.     Motor: Weakness present. No abnormal muscle tone.     Coordination: Coordination abnormal.     Gait: Gait abnormal.     Deep Tendon Reflexes: Reflexes normal.  Psychiatric:        Behavior: Behavior normal.        Thought Content: Thought content normal.        Judgment: Judgment normal.   R shoulder w/pain on ROM Using a trekking pole   Lab Results  Component Value Date   WBC 13.7 (H) 09/14/2021   HGB 12.6 09/14/2021   HCT 40.1 09/14/2021   PLT 263 09/14/2021   GLUCOSE 145  (H) 09/14/2021   CHOL 215 (H) 09/25/2014   TRIG 154.0 (H) 09/25/2014   HDL 46.00 09/25/2014   LDLCALC 138 (H) 09/25/2014   ALT 17 09/14/2021   AST 21 09/14/2021   NA 136 09/14/2021   K 4.7 09/14/2021   CL 92 (L) 09/14/2021   CREATININE 4.96 (H) 09/14/2021   BUN 58 (H) 09/14/2021   CO2 24 09/14/2021   TSH 3.98 04/16/2020   INR 1.1 08/06/2021   HGBA1C 6.0 12/15/2020    CT ENTERO ABD/PELVIS W CONTAST  Result Date: 11/10/2021 CLINICAL DATA:  Abdominal pain and fever. EXAM: CT ABDOMEN AND PELVIS WITH CONTRAST (ENTEROGRAPHY) TECHNIQUE: Multidetector CT of the abdomen and pelvis during bolus administration of intravenous contrast. Negative oral contrast was given. CONTRAST:  157m OMNIPAQUE IOHEXOL 350 MG/ML SOLN COMPARISON:  09/14/2021 FINDINGS: Lower chest:  No acute findings. Hepatobiliary: No masses or other significant abnormality. Pancreas: No mass, inflammatory changes, or other significant abnormality. Spleen: Within normal limits in size and appearance. Adrenals/Urinary Tract: Normal adrenal glands. Bilateral renal cortical volume loss is identified. Left kidney cyst arises off the lateral cortex of the interpolar left kidney measuring 3 cm, image 61/6. A scratch set multiple, additional too small to reliably characterize low-density kidney lesions are also noted arising off the upper and lower pole of the left kidney. No hydronephrosis or nephrolithiasis identified bilaterally. Urinary bladder appears normal. Stomach/Bowel: Stomach appears normal. There is no small bowel wall thickening, inflammation, or distension. Terminal ileum is visualized and appears normal. The appendix is not confidently identified. Interval resolution of previously noted right lower quadrant enteritis. No bowel wall thickening, inflammation, or distension. Moderate retained stool is identified throughout the colon. Sigmoid diverticulosis without signs of  acute diverticulitis. Vascular/Lymphatic: Aortic  atherosclerosis. Upper abdominal vasculature is widely patent including the celiac trunk and superior mesenteric artery. The IMA is also patent. Portal vein, splenic vein and superior mesenteric vein are all widely patent. Interval resolution of previously characterized right lower quadrant perienteric fat stranding. Reproductive: Small calcified fibroid arises off the uterine fundus. No adnexal mass. Other: Large umbilical hernia is identified containing fat only. This measures 8.6 cm in maximum dimension no free fluid or fluid collections. Musculoskeletal: Degenerative disc disease identified within the lumbar spine. No acute or suspicious osseous findings. Subacute to chronic left eighth and ninth posterior rib fractures are again noted. IMPRESSION: 1. No acute findings identified within the abdomen or pelvis. Interval resolution of previously characterized right lower quadrant enteritis with surrounding perienteric fat stranding. 2. Patent abdominal vasculature 3. Large umbilical hernia contains fat only. 4. Aortic Atherosclerosis (ICD10-I70.0). Electronically Signed   By: Taylor  Stroud M.D.   On: 11/10/2021 13:02  ° ° °Assessment & Plan:  ° °Problem List Items Addressed This Visit   ° ° ESRD (end stage renal disease) on dialysis ([name])  °  Low BP w/HD °F/u w/Dr [place] °  °  ° Essential hypertension  °  Low BP - on Midodrin on HD days °  °  ° Hypotension  °  Low BP - on Midodrin on HD days °  °  ° Pain in joint of right shoulder  °  Pt is planning to have a shoulder arthroplasty on the R  - Dr Supple (fistula is on the R too) °  °  °  ° ° °No orders of the defined types were placed in this encounter. °  ° ° °Follow-up: Return in about 3 months (around 03/21/2022) for a follow-up visit. ° °Alex , MD °

## 2021-12-22 DIAGNOSIS — Z992 Dependence on renal dialysis: Secondary | ICD-10-CM | POA: Diagnosis not present

## 2021-12-22 DIAGNOSIS — D688 Other specified coagulation defects: Secondary | ICD-10-CM | POA: Diagnosis not present

## 2021-12-22 DIAGNOSIS — E1129 Type 2 diabetes mellitus with other diabetic kidney complication: Secondary | ICD-10-CM | POA: Diagnosis not present

## 2021-12-22 DIAGNOSIS — D631 Anemia in chronic kidney disease: Secondary | ICD-10-CM | POA: Diagnosis not present

## 2021-12-22 DIAGNOSIS — N2581 Secondary hyperparathyroidism of renal origin: Secondary | ICD-10-CM | POA: Diagnosis not present

## 2021-12-22 DIAGNOSIS — N186 End stage renal disease: Secondary | ICD-10-CM | POA: Diagnosis not present

## 2021-12-24 DIAGNOSIS — N2581 Secondary hyperparathyroidism of renal origin: Secondary | ICD-10-CM | POA: Diagnosis not present

## 2021-12-24 DIAGNOSIS — D688 Other specified coagulation defects: Secondary | ICD-10-CM | POA: Diagnosis not present

## 2021-12-24 DIAGNOSIS — N186 End stage renal disease: Secondary | ICD-10-CM | POA: Diagnosis not present

## 2021-12-24 DIAGNOSIS — D631 Anemia in chronic kidney disease: Secondary | ICD-10-CM | POA: Diagnosis not present

## 2021-12-24 DIAGNOSIS — E1129 Type 2 diabetes mellitus with other diabetic kidney complication: Secondary | ICD-10-CM | POA: Diagnosis not present

## 2021-12-24 DIAGNOSIS — Z992 Dependence on renal dialysis: Secondary | ICD-10-CM | POA: Diagnosis not present

## 2021-12-25 ENCOUNTER — Other Ambulatory Visit (HOSPITAL_COMMUNITY): Payer: Self-pay

## 2021-12-25 ENCOUNTER — Other Ambulatory Visit: Payer: Self-pay | Admitting: Internal Medicine

## 2021-12-25 MED ORDER — CYCLOBENZAPRINE HCL 10 MG PO TABS
10.0000 mg | ORAL_TABLET | Freq: Three times a day (TID) | ORAL | 0 refills | Status: DC | PRN
  Filled 2021-12-25 – 2022-01-07 (×2): qty 270, 90d supply, fill #0

## 2021-12-26 DIAGNOSIS — Z992 Dependence on renal dialysis: Secondary | ICD-10-CM | POA: Diagnosis not present

## 2021-12-26 DIAGNOSIS — N186 End stage renal disease: Secondary | ICD-10-CM | POA: Diagnosis not present

## 2021-12-26 DIAGNOSIS — D688 Other specified coagulation defects: Secondary | ICD-10-CM | POA: Diagnosis not present

## 2021-12-26 DIAGNOSIS — E1129 Type 2 diabetes mellitus with other diabetic kidney complication: Secondary | ICD-10-CM | POA: Diagnosis not present

## 2021-12-26 DIAGNOSIS — D631 Anemia in chronic kidney disease: Secondary | ICD-10-CM | POA: Diagnosis not present

## 2021-12-26 DIAGNOSIS — N2581 Secondary hyperparathyroidism of renal origin: Secondary | ICD-10-CM | POA: Diagnosis not present

## 2021-12-28 ENCOUNTER — Telehealth: Payer: Self-pay | Admitting: Internal Medicine

## 2021-12-28 NOTE — Telephone Encounter (Signed)
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  She has developed a hemorrhoid that is swollen and distended and that she passes blood and clots when she has a BM.  She is on dialysis and says she has a difficult time as it is keeping her hemoglobin up.  She would like a call to let her know how to proceed.  Thank you.

## 2021-12-28 NOTE — Telephone Encounter (Signed)
Pt states that she has developed a painful hemorrhoid and is having rectal bleeding. She is concerned because she states she is on dialysis and has a difficult time keeping her Hgb up. Discussed with pt that she can try sitz bathes, OTC creams/supp, and recticare for pain. Pt states she has been using some lidocaine on the area and it only helps for a minute or two. Pt scheduled to see [name] 01/06/22 at 3:40pm. Pt aware of appt.

## 2021-12-29 DIAGNOSIS — D688 Other specified coagulation defects: Secondary | ICD-10-CM | POA: Diagnosis not present

## 2021-12-29 DIAGNOSIS — N186 End stage renal disease: Secondary | ICD-10-CM | POA: Diagnosis not present

## 2021-12-29 DIAGNOSIS — N2581 Secondary hyperparathyroidism of renal origin: Secondary | ICD-10-CM | POA: Diagnosis not present

## 2021-12-29 DIAGNOSIS — E1129 Type 2 diabetes mellitus with other diabetic kidney complication: Secondary | ICD-10-CM | POA: Diagnosis not present

## 2021-12-29 DIAGNOSIS — Z992 Dependence on renal dialysis: Secondary | ICD-10-CM | POA: Diagnosis not present

## 2021-12-29 DIAGNOSIS — D631 Anemia in chronic kidney disease: Secondary | ICD-10-CM | POA: Diagnosis not present

## 2021-12-30 DIAGNOSIS — Z992 Dependence on renal dialysis: Secondary | ICD-10-CM | POA: Diagnosis not present

## 2021-12-30 DIAGNOSIS — T82858A Stenosis of vascular prosthetic devices, implants and grafts, initial encounter: Secondary | ICD-10-CM | POA: Diagnosis not present

## 2021-12-30 DIAGNOSIS — I871 Compression of vein: Secondary | ICD-10-CM | POA: Diagnosis not present

## 2021-12-30 DIAGNOSIS — N186 End stage renal disease: Secondary | ICD-10-CM | POA: Diagnosis not present

## 2021-12-31 DIAGNOSIS — N2581 Secondary hyperparathyroidism of renal origin: Secondary | ICD-10-CM | POA: Diagnosis not present

## 2021-12-31 DIAGNOSIS — Z992 Dependence on renal dialysis: Secondary | ICD-10-CM | POA: Diagnosis not present

## 2021-12-31 DIAGNOSIS — D631 Anemia in chronic kidney disease: Secondary | ICD-10-CM | POA: Diagnosis not present

## 2021-12-31 DIAGNOSIS — N186 End stage renal disease: Secondary | ICD-10-CM | POA: Diagnosis not present

## 2021-12-31 DIAGNOSIS — D688 Other specified coagulation defects: Secondary | ICD-10-CM | POA: Diagnosis not present

## 2021-12-31 DIAGNOSIS — E1129 Type 2 diabetes mellitus with other diabetic kidney complication: Secondary | ICD-10-CM | POA: Diagnosis not present

## 2022-01-02 DIAGNOSIS — N186 End stage renal disease: Secondary | ICD-10-CM | POA: Diagnosis not present

## 2022-01-02 DIAGNOSIS — N2581 Secondary hyperparathyroidism of renal origin: Secondary | ICD-10-CM | POA: Diagnosis not present

## 2022-01-02 DIAGNOSIS — D688 Other specified coagulation defects: Secondary | ICD-10-CM | POA: Diagnosis not present

## 2022-01-02 DIAGNOSIS — D631 Anemia in chronic kidney disease: Secondary | ICD-10-CM | POA: Diagnosis not present

## 2022-01-02 DIAGNOSIS — E1129 Type 2 diabetes mellitus with other diabetic kidney complication: Secondary | ICD-10-CM | POA: Diagnosis not present

## 2022-01-02 DIAGNOSIS — Z992 Dependence on renal dialysis: Secondary | ICD-10-CM | POA: Diagnosis not present

## 2022-01-04 ENCOUNTER — Other Ambulatory Visit (HOSPITAL_COMMUNITY): Payer: Self-pay

## 2022-01-05 DIAGNOSIS — Z992 Dependence on renal dialysis: Secondary | ICD-10-CM | POA: Diagnosis not present

## 2022-01-05 DIAGNOSIS — D631 Anemia in chronic kidney disease: Secondary | ICD-10-CM | POA: Diagnosis not present

## 2022-01-05 DIAGNOSIS — N2581 Secondary hyperparathyroidism of renal origin: Secondary | ICD-10-CM | POA: Diagnosis not present

## 2022-01-05 DIAGNOSIS — E1129 Type 2 diabetes mellitus with other diabetic kidney complication: Secondary | ICD-10-CM | POA: Diagnosis not present

## 2022-01-05 DIAGNOSIS — D688 Other specified coagulation defects: Secondary | ICD-10-CM | POA: Diagnosis not present

## 2022-01-05 DIAGNOSIS — N186 End stage renal disease: Secondary | ICD-10-CM | POA: Diagnosis not present

## 2022-01-06 ENCOUNTER — Ambulatory Visit: Payer: Medicare Other | Admitting: Internal Medicine

## 2022-01-06 ENCOUNTER — Encounter: Payer: Self-pay | Admitting: Internal Medicine

## 2022-01-06 ENCOUNTER — Telehealth: Payer: Self-pay

## 2022-01-06 ENCOUNTER — Telehealth: Payer: Self-pay | Admitting: Internal Medicine

## 2022-01-06 VITALS — BP 112/66 | HR 88 | Ht 62.0 in | Wt 217.2 lb

## 2022-01-06 DIAGNOSIS — K649 Unspecified hemorrhoids: Secondary | ICD-10-CM

## 2022-01-06 DIAGNOSIS — K645 Perianal venous thrombosis: Secondary | ICD-10-CM

## 2022-01-06 DIAGNOSIS — K625 Hemorrhage of anus and rectum: Secondary | ICD-10-CM

## 2022-01-06 DIAGNOSIS — Z86010 Personal history of colonic polyps: Secondary | ICD-10-CM

## 2022-01-06 NOTE — Progress Notes (Signed)
HISTORY OF PRESENT ILLNESS:  [name] is a 71 y.o. female, retired endoscopy nurse, with multiple medical problems as listed below.  She presents today regarding rectal pain and rectal bleeding which began over 1 week ago.  She was last seen in this office November 19, 2021 after hospitalization with unspecified enteritis.  Her current history is that of rectal discomfort followed by rectal bleeding with clots.  She felt this was an obvious hemorrhoidal issue.  She has been using topical lidocaine at home.  She does have medication related constipation for which she takes MiraLAX.  This works.  No other GI complaints at this time.  REVIEW OF SYSTEMS:  All non-GI ROS negative except for arthritis  Past Medical History:  Diagnosis Date   Anemia    Anxiety    Brain tumor (benign) ([place])    Colitis 2010   microscopic- Dr Henrene Pastor   Depression    Diabetes mellitus    type II   Dyspnea    with exertion   ESRD (end stage renal disease) ([place])    TTUSAT [address]    Fibromyalgia    GERD (gastroesophageal reflux disease)    Headache    History of blood transfusion    after knee surgery   Hypertension    discontinued all diuretics and antihypertensives   IBS (irritable bowel syndrome)    LBP (low back pain)    Neuropathy    feet bilat    Osteoarthritis    Osteopenia    Pneumonia    hx of 2014    Rotator cuff tear, right    Sinusitis    currently being treated with antibiotic will complete 03/04/2015    Past Surgical History:  Procedure Laterality Date   AV FISTULA PLACEMENT Right 09/12/2020   Procedure: RIGHT ARTERIOVENOUS (AV) FISTULA CREATION;  Surgeon: Elam Dutch, MD;  Location: [facility];  Service: Vascular;  Laterality: Right;   [place] Right 10/27/2020   Procedure: RIGHT UPPER EXTREMITY SECOND STAGE [place];  Surgeon: Elam Dutch, MD;  Location: [place];  Service: Vascular;  Laterality: Right;   foramen magnum ependymoma  surgery  2003   Dr Rita Ohara   JOINT REPLACEMENT Bilateral    NASAL SINUS SURGERY     1973    TONSILLECTOMY     TOTAL KNEE ARTHROPLASTY     L 2008, R 2009, R 2016- Dr Maureen Ralphs   TOTAL KNEE REVISION Right 03/05/2015   Procedure: RIGHT TOTAL KNEE ARTHROPLASTY REVISION;  Surgeon: Gaynelle Arabian, MD;  Location: [facility];  Service: Orthopedics;  Laterality: Right;    Social History [name]  reports that she quit smoking about 33 years ago. Her smoking use included cigarettes. She has a 20.00 pack-year smoking history. She has never used smokeless tobacco. She reports that she does not currently use alcohol. She reports that she does not use drugs.  family history includes Cancer in her brother; Crohn's disease in her maternal uncle; Depression in her mother; Diabetes in her brother, brother, father, mother, and another family member; Heart attack in her brother; Heart disease in her brother and brother; Hyperlipidemia in her brother, brother, and father; Hypertension in her brother, brother, father, and mother; Stroke in her brother; Stroke (age of onset: 29) in her mother.  Allergies  Allergen Reactions   Erythromycin Diarrhea and Nausea And Vomiting   Gabapentin Other (See Comments)    Confusion and falling  PHYSICAL EXAMINATION: Vital signs: BP 112/66    Pulse 88    Ht 5\' 2"  (1.575 m)    Wt 217 lb 4 oz (98.5 kg)    SpO2 96%    BMI 39.74 kg/m   Constitutional: Overweight but generally well-appearing, no acute distress Psychiatric: alert and oriented x3, cooperative Eyes: Anicteric Rectal: Nontender thrombosed external hemorrhoid as well as previously thrombosed external hemorrhoid with spontaneous rupture Skin.  Anicteric Neuro: Grossly intact  ASSESSMENT:  1.  External hemorrhoids with thrombosis and spontaneous rupture as described. 2.  History of colon polyps 3.  GERD 4.  History of microscopic colitis 5.  Multiple medical problems   PLAN:  1.  Sitz bath's 2.  5%  lidocaine 3.  [facility].  Samples provided 4.  Keep stool soft with MiraLAX and/or Metamucil 5.  Follow-up as needed

## 2022-01-06 NOTE — Progress Notes (Signed)
Chronic Care Management Pharmacy Assistant   Name: [name]  MRN: [id number] DOB: 02-Dec-1951  Reason for Encounter: Disease State_General   Recent office visits:  None ID  Recent consult visits:  11/19/21 Noralyn Pick, NP-Gastro (abdominal pain) no orders or med changes  11/11/21 Croitoru, Dani Gobble, MD-Cardiology (SOB) Lab Work:none, No med changes  Hospital visits:  Medication Reconciliation was completed by comparing discharge summary, patients EMR and Pharmacy list, and upon discussion with patient.  Admitted to the hospital on 09/14/21 due to closed fracture of left rib. Discharge date was 09/14/21. Discharged from [place] [facility] [place].   Medications that remain the same after Hospital Discharge:??  -All other medications will remain the same.    Medications: Outpatient Encounter Medications as of 01/06/2022  Medication Sig Note   cyclobenzaprine (FLEXERIL) 10 MG tablet Take 1 tablet (10 mg total) by mouth 3 (three) times daily as needed for muscle spasms.    acetaminophen (TYLENOL) 500 MG tablet Take 1,000 mg by mouth 2 (two) times daily as needed for headache.    amoxicillin (AMOXIL) 500 MG capsule TAKE 4 CAPSULES BY MOUTH 1  HOUR PRIOR TO DENTAL  PROCEDURE/CLEANING (Patient not taking: Reported on 12/21/2021)    aspirin-acetaminophen-caffeine (EXCEDRIN MIGRAINE) 250-250-65 MG tablet Take 1 tablet by mouth daily as needed for headache or migraine. Max 3 doses in one week    B Complex-C-Folic Acid (RENA-VITE PO) Take 1 tablet by mouth every morning.    blood glucose meter kit and supplies Dispense based on patient and insurance preference. Used to check blood sugar daily, DX: E11.9 OneTouch    buPROPion (WELLBUTRIN XL) 300 MG 24 hr tablet TAKE 1 TABLET BY MOUTH AT  BEDTIME    carboxymethylcellulose (REFRESH PLUS) 0.5 % SOLN Place 1 drop into both eyes 3 (three) times daily as needed (dry eyes/irritation).    dicyclomine (BENTYL) 20 MG tablet Take one  tablet every 4-6 hours as needed for abdominal pain (Patient taking differently: Take 20 mg by mouth every 4 (four) hours as needed (abdominal pain).)    docusate sodium (COLACE) 100 MG capsule Take 100 mg by mouth 2 (two) times daily.    FLUoxetine (PROZAC) 20 MG capsule Take 1 capsule (20 mg total) by mouth every morning.    glimepiride (AMARYL) 1 MG tablet TAKE 1 TABLET BY MOUTH  DAILY BEFORE BREAKFAST    glucose blood (TRUETEST TEST) test strip Used to check blood sugar daily, DX: E11.9    ketoconazole (NIZORAL) 200 MG tablet Take 100 mg by mouth See admin instructions. Take 1/2 tablet (100 mg) by mouth daily for 30 days - as needed for yeast infection (Patient not taking: Reported on 12/21/2021)    lidocaine (XYLOCAINE) 5 % ointment Apply 1 application topically 3 (three) times daily as needed for moderate pain.    lidocaine-prilocaine (EMLA) cream Apply 1 application topically See admin instructions. Apply topically to port access one hour prior to dialysis, cover with plastic- Tuesday, Thursday, Saturday    loratadine (CLARITIN) 10 MG tablet Take 10 mg by mouth daily as needed for allergies.    LORazepam (ATIVAN) 2 MG tablet Take 1 tablet (2 mg total) by mouth 2 (two) times daily as needed for anxiety    midodrine (PROAMATINE) 5 MG tablet Take 1-2 tablet by mouth three times a week as directed Take 30 minutes before dialysis and another tablet during middle of dialysis treatment (Patient taking differently: Take 5 mg by mouth See admin instructions. Take  one tablet (5 mg) by mouth 30 minutes before dialysis (Tuesday, Thursday, Saturday); take one tablet (5 mg) during dialysis as needed for SBP <80) 09/14/2021: 2 tablets on Saturday (1st time pt had taken the 2nd tablet)   multivitamin-lutein (OCUVITE-LUTEIN) CAPS capsule Take 1 capsule by mouth every morning.    oxyCODONE (ROXICODONE) 15 MG immediate release tablet Take 1 tablet (15 mg total) by mouth 4 (four) times daily as needed.    pantoprazole  (PROTONIX) 40 MG tablet TAKE 1 TABLET BY MOUTH  DAILY    pilocarpine (SALAGEN) 5 MG tablet TAKE 1 TABLET BY MOUTH  TWICE DAILY    pioglitazone (ACTOS) 15 MG tablet TAKE 1 TABLET BY MOUTH  DAILY    polyethylene glycol (MIRALAX / GLYCOLAX) 17 g packet Take 17 g by mouth See admin instructions. Mix 17 g powder in 1/2 cup liquid and drink by mouth on Tuesday and Saturday evenings    sevelamer carbonate (RENVELA) 800 MG tablet Take 800-1,600 mg by mouth See admin instructions. Take 2 tablets (1600 mg) by mouth three times daily with meals, take 1 tablet (800 mg) with snacks    triamcinolone (NASACORT) 55 MCG/ACT AERO nasal inhaler Place 2 sprays into the nose daily as needed (congestion). (Patient not taking: Reported on 12/21/2021)    No facility-administered encounter medications on file as of 01/06/2022.   Have you had any problems recently with your health?Patient states that she does not have any new health issues at this time. She states that she is doing well  Have you had any problems with your pharmacy?Patient states that she is not having any problems with getting medication or the cost of medication from the pharmacy  What issues or side effects are you having with your medications? Patient states she is not having any side effects from medications.  What would you like me to pass along to [facility] for them to help you with? Patient states she is doing well as excepted   What can we do to take care of you better? Patient states that she does not need anything at this time  Care Gaps: Colonoscopy-02/04/15 Diabetic Foot Exam-04/28/16 Mammogram-05/13/21 Ophthalmology-04/13/15 Dexa Scan - NA Annual Well Visit - NA Micro albumin-NA Hemoglobin A1c- 12/15/20  Star Rating Drugs: Pioglitazone 15 mg-last fill 12/25/21 90 ds  Ethelene Hal Clinical Pharmacist Assistant [phone]

## 2022-01-06 NOTE — Telephone Encounter (Signed)
Left message for patient to call back to schedule Medicare Annual Wellness Visit   Last AWV  12/10/20  Please schedule at anytime with [facility] if patient calls the office back.    40 Minutes appointment   Any questions, please call me at [phone]

## 2022-01-06 NOTE — Patient Instructions (Signed)
If you are age 69 or older, your body mass index should be between 23-30. Your Body mass index is 39.74 kg/m. If this is out of the aforementioned range listed, please consider follow up with your Primary Care Provider.  If you are age 53 or younger, your body mass index should be between 19-25. Your Body mass index is 39.74 kg/m. If this is out of the aformentioned range listed, please consider follow up with your Primary Care Provider.   ________________________________________________________  The [facility] providers would like to encourage you to use [facility] to communicate with providers for non-urgent requests or questions.  Due to long hold times on the telephone, sending your provider a message by [facility] may be a faster and more efficient way to get a response.  Please allow 48 business hours for a response.  Please remember that this is for non-urgent requests.  _______________________________________________________  [name] [facility]

## 2022-01-07 ENCOUNTER — Other Ambulatory Visit (HOSPITAL_COMMUNITY): Payer: Self-pay

## 2022-01-07 DIAGNOSIS — D631 Anemia in chronic kidney disease: Secondary | ICD-10-CM | POA: Diagnosis not present

## 2022-01-07 DIAGNOSIS — N2581 Secondary hyperparathyroidism of renal origin: Secondary | ICD-10-CM | POA: Diagnosis not present

## 2022-01-07 DIAGNOSIS — Z992 Dependence on renal dialysis: Secondary | ICD-10-CM | POA: Diagnosis not present

## 2022-01-07 DIAGNOSIS — N186 End stage renal disease: Secondary | ICD-10-CM | POA: Diagnosis not present

## 2022-01-07 DIAGNOSIS — D688 Other specified coagulation defects: Secondary | ICD-10-CM | POA: Diagnosis not present

## 2022-01-07 DIAGNOSIS — E1129 Type 2 diabetes mellitus with other diabetic kidney complication: Secondary | ICD-10-CM | POA: Diagnosis not present

## 2022-01-07 NOTE — Telephone Encounter (Signed)
Pt states she is not interested in scheduling appt  *see below*

## 2022-01-08 ENCOUNTER — Other Ambulatory Visit (HOSPITAL_COMMUNITY): Payer: Self-pay

## 2022-01-08 MED ORDER — OXYCODONE HCL 15 MG PO TABS
15.0000 mg | ORAL_TABLET | Freq: Four times a day (QID) | ORAL | 0 refills | Status: DC | PRN
  Filled 2022-01-08: qty 120, 30d supply, fill #0

## 2022-01-09 DIAGNOSIS — Z992 Dependence on renal dialysis: Secondary | ICD-10-CM | POA: Diagnosis not present

## 2022-01-09 DIAGNOSIS — N186 End stage renal disease: Secondary | ICD-10-CM | POA: Diagnosis not present

## 2022-01-09 DIAGNOSIS — D631 Anemia in chronic kidney disease: Secondary | ICD-10-CM | POA: Diagnosis not present

## 2022-01-09 DIAGNOSIS — E1129 Type 2 diabetes mellitus with other diabetic kidney complication: Secondary | ICD-10-CM | POA: Diagnosis not present

## 2022-01-09 DIAGNOSIS — N2581 Secondary hyperparathyroidism of renal origin: Secondary | ICD-10-CM | POA: Diagnosis not present

## 2022-01-09 DIAGNOSIS — D688 Other specified coagulation defects: Secondary | ICD-10-CM | POA: Diagnosis not present

## 2022-01-12 DIAGNOSIS — E1022 Type 1 diabetes mellitus with diabetic chronic kidney disease: Secondary | ICD-10-CM | POA: Diagnosis not present

## 2022-01-12 DIAGNOSIS — E1129 Type 2 diabetes mellitus with other diabetic kidney complication: Secondary | ICD-10-CM | POA: Diagnosis not present

## 2022-01-12 DIAGNOSIS — D631 Anemia in chronic kidney disease: Secondary | ICD-10-CM | POA: Diagnosis not present

## 2022-01-12 DIAGNOSIS — D688 Other specified coagulation defects: Secondary | ICD-10-CM | POA: Diagnosis not present

## 2022-01-12 DIAGNOSIS — Z992 Dependence on renal dialysis: Secondary | ICD-10-CM | POA: Diagnosis not present

## 2022-01-12 DIAGNOSIS — N186 End stage renal disease: Secondary | ICD-10-CM | POA: Diagnosis not present

## 2022-01-12 DIAGNOSIS — N2581 Secondary hyperparathyroidism of renal origin: Secondary | ICD-10-CM | POA: Diagnosis not present

## 2022-01-14 DIAGNOSIS — Z992 Dependence on renal dialysis: Secondary | ICD-10-CM | POA: Diagnosis not present

## 2022-01-14 DIAGNOSIS — N186 End stage renal disease: Secondary | ICD-10-CM | POA: Diagnosis not present

## 2022-01-14 DIAGNOSIS — D631 Anemia in chronic kidney disease: Secondary | ICD-10-CM | POA: Diagnosis not present

## 2022-01-14 DIAGNOSIS — D688 Other specified coagulation defects: Secondary | ICD-10-CM | POA: Diagnosis not present

## 2022-01-14 DIAGNOSIS — N2581 Secondary hyperparathyroidism of renal origin: Secondary | ICD-10-CM | POA: Diagnosis not present

## 2022-01-15 DIAGNOSIS — M2142 Flat foot [pes planus] (acquired), left foot: Secondary | ICD-10-CM | POA: Diagnosis not present

## 2022-01-16 DIAGNOSIS — D688 Other specified coagulation defects: Secondary | ICD-10-CM | POA: Diagnosis not present

## 2022-01-16 DIAGNOSIS — D631 Anemia in chronic kidney disease: Secondary | ICD-10-CM | POA: Diagnosis not present

## 2022-01-16 DIAGNOSIS — N186 End stage renal disease: Secondary | ICD-10-CM | POA: Diagnosis not present

## 2022-01-16 DIAGNOSIS — Z992 Dependence on renal dialysis: Secondary | ICD-10-CM | POA: Diagnosis not present

## 2022-01-16 DIAGNOSIS — N2581 Secondary hyperparathyroidism of renal origin: Secondary | ICD-10-CM | POA: Diagnosis not present

## 2022-01-19 DIAGNOSIS — D688 Other specified coagulation defects: Secondary | ICD-10-CM | POA: Diagnosis not present

## 2022-01-19 DIAGNOSIS — Z992 Dependence on renal dialysis: Secondary | ICD-10-CM | POA: Diagnosis not present

## 2022-01-19 DIAGNOSIS — D631 Anemia in chronic kidney disease: Secondary | ICD-10-CM | POA: Diagnosis not present

## 2022-01-19 DIAGNOSIS — N2581 Secondary hyperparathyroidism of renal origin: Secondary | ICD-10-CM | POA: Diagnosis not present

## 2022-01-19 DIAGNOSIS — N186 End stage renal disease: Secondary | ICD-10-CM | POA: Diagnosis not present

## 2022-01-20 ENCOUNTER — Telehealth: Payer: Self-pay

## 2022-01-20 ENCOUNTER — Other Ambulatory Visit: Payer: Self-pay

## 2022-01-20 ENCOUNTER — Other Ambulatory Visit (HOSPITAL_COMMUNITY): Payer: Self-pay

## 2022-01-20 DIAGNOSIS — Z79891 Long term (current) use of opiate analgesic: Secondary | ICD-10-CM | POA: Diagnosis not present

## 2022-01-20 DIAGNOSIS — M5030 Other cervical disc degeneration, unspecified cervical region: Secondary | ICD-10-CM | POA: Diagnosis not present

## 2022-01-20 MED ORDER — GLIMEPIRIDE 1 MG PO TABS
1.0000 mg | ORAL_TABLET | Freq: Every day | ORAL | 3 refills | Status: DC
  Filled 2022-01-20: qty 90, 90d supply, fill #0

## 2022-01-20 MED ORDER — NALOXONE HCL 4 MG/0.1ML NA LIQD
NASAL | 0 refills | Status: AC
  Filled 2022-01-20: qty 2, 2d supply, fill #0

## 2022-01-20 NOTE — Telephone Encounter (Signed)
Pt is requesting a refill on: glimepiride (AMARYL) 1 MG tablet  [facility] is currently out of stock. Advise pt to get it from local Pharmacy.  Pharmacy: [place]  LOV 12/21/21  Pt CB [phone]

## 2022-01-21 ENCOUNTER — Other Ambulatory Visit (HOSPITAL_COMMUNITY): Payer: Self-pay

## 2022-01-21 DIAGNOSIS — D631 Anemia in chronic kidney disease: Secondary | ICD-10-CM | POA: Diagnosis not present

## 2022-01-21 DIAGNOSIS — N2581 Secondary hyperparathyroidism of renal origin: Secondary | ICD-10-CM | POA: Diagnosis not present

## 2022-01-21 DIAGNOSIS — Z992 Dependence on renal dialysis: Secondary | ICD-10-CM | POA: Diagnosis not present

## 2022-01-21 DIAGNOSIS — N186 End stage renal disease: Secondary | ICD-10-CM | POA: Diagnosis not present

## 2022-01-21 DIAGNOSIS — D688 Other specified coagulation defects: Secondary | ICD-10-CM | POA: Diagnosis not present

## 2022-01-23 DIAGNOSIS — N2581 Secondary hyperparathyroidism of renal origin: Secondary | ICD-10-CM | POA: Diagnosis not present

## 2022-01-23 DIAGNOSIS — D631 Anemia in chronic kidney disease: Secondary | ICD-10-CM | POA: Diagnosis not present

## 2022-01-23 DIAGNOSIS — D688 Other specified coagulation defects: Secondary | ICD-10-CM | POA: Diagnosis not present

## 2022-01-23 DIAGNOSIS — Z992 Dependence on renal dialysis: Secondary | ICD-10-CM | POA: Diagnosis not present

## 2022-01-23 DIAGNOSIS — N186 End stage renal disease: Secondary | ICD-10-CM | POA: Diagnosis not present

## 2022-01-26 DIAGNOSIS — D631 Anemia in chronic kidney disease: Secondary | ICD-10-CM | POA: Diagnosis not present

## 2022-01-26 DIAGNOSIS — N186 End stage renal disease: Secondary | ICD-10-CM | POA: Diagnosis not present

## 2022-01-26 DIAGNOSIS — Z992 Dependence on renal dialysis: Secondary | ICD-10-CM | POA: Diagnosis not present

## 2022-01-26 DIAGNOSIS — N2581 Secondary hyperparathyroidism of renal origin: Secondary | ICD-10-CM | POA: Diagnosis not present

## 2022-01-26 DIAGNOSIS — D688 Other specified coagulation defects: Secondary | ICD-10-CM | POA: Diagnosis not present

## 2022-01-28 DIAGNOSIS — D688 Other specified coagulation defects: Secondary | ICD-10-CM | POA: Diagnosis not present

## 2022-01-28 DIAGNOSIS — N2581 Secondary hyperparathyroidism of renal origin: Secondary | ICD-10-CM | POA: Diagnosis not present

## 2022-01-28 DIAGNOSIS — N186 End stage renal disease: Secondary | ICD-10-CM | POA: Diagnosis not present

## 2022-01-28 DIAGNOSIS — Z992 Dependence on renal dialysis: Secondary | ICD-10-CM | POA: Diagnosis not present

## 2022-01-28 DIAGNOSIS — D631 Anemia in chronic kidney disease: Secondary | ICD-10-CM | POA: Diagnosis not present

## 2022-01-29 ENCOUNTER — Other Ambulatory Visit (HOSPITAL_COMMUNITY): Payer: Self-pay

## 2022-01-29 MED ORDER — LORAZEPAM 2 MG PO TABS
2.0000 mg | ORAL_TABLET | Freq: Two times a day (BID) | ORAL | 1 refills | Status: DC | PRN
  Filled 2022-01-29 – 2022-02-04 (×2): qty 180, 90d supply, fill #0
  Filled 2022-05-11: qty 50, 25d supply, fill #1
  Filled 2022-05-11: qty 130, 65d supply, fill #1

## 2022-01-29 MED ORDER — BUPROPION HCL ER (XL) 300 MG PO TB24
300.0000 mg | ORAL_TABLET | Freq: Every morning | ORAL | 1 refills | Status: DC
  Filled 2022-01-29: qty 90, 90d supply, fill #0

## 2022-01-29 MED ORDER — FLUOXETINE HCL 20 MG PO CAPS
20.0000 mg | ORAL_CAPSULE | Freq: Every morning | ORAL | 1 refills | Status: DC
  Filled 2022-01-29 – 2022-04-05 (×2): qty 90, 90d supply, fill #0
  Filled 2022-07-05 – 2022-07-06 (×2): qty 90, 90d supply, fill #1

## 2022-01-30 DIAGNOSIS — D631 Anemia in chronic kidney disease: Secondary | ICD-10-CM | POA: Diagnosis not present

## 2022-01-30 DIAGNOSIS — N2581 Secondary hyperparathyroidism of renal origin: Secondary | ICD-10-CM | POA: Diagnosis not present

## 2022-01-30 DIAGNOSIS — N186 End stage renal disease: Secondary | ICD-10-CM | POA: Diagnosis not present

## 2022-01-30 DIAGNOSIS — D688 Other specified coagulation defects: Secondary | ICD-10-CM | POA: Diagnosis not present

## 2022-01-30 DIAGNOSIS — Z992 Dependence on renal dialysis: Secondary | ICD-10-CM | POA: Diagnosis not present

## 2022-02-01 ENCOUNTER — Telehealth: Payer: Self-pay

## 2022-02-01 NOTE — Chronic Care Management (AMB) (Signed)
Chronic Care Management Pharmacy Assistant   Name: [name]  MRN: [id number] DOB: 1951-11-02  [name] is an 71 y.o. year old female who presents for his follow-up CCM visit with the clinical pharmacist.  Reason for Encounter: Disease State-General   Recent office visits:  None ID  Recent consult visits:  01/06/22 Hilarie Fredrickson, MD-Gastroenterology (Rectal Bleeding) No orders or med changes  Hospital visits:  None since last coordination call  Medications: Outpatient Encounter Medications as of 02/01/2022  Medication Sig Note   acetaminophen (TYLENOL) 500 MG tablet Take 1,000 mg by mouth 2 (two) times daily as needed for headache.    amoxicillin (AMOXIL) 500 MG capsule TAKE 4 CAPSULES BY MOUTH 1  HOUR PRIOR TO DENTAL  PROCEDURE/CLEANING    aspirin-acetaminophen-caffeine (EXCEDRIN MIGRAINE) 250-250-65 MG tablet Take 1 tablet by mouth daily as needed for headache or migraine. Max 3 doses in one week    B Complex-C-Folic Acid (RENA-VITE PO) Take 1 tablet by mouth every morning.    blood glucose meter kit and supplies Dispense based on patient and insurance preference. Used to check blood sugar daily, DX: E11.9 OneTouch    buPROPion (WELLBUTRIN XL) 300 MG 24 hr tablet TAKE 1 TABLET BY MOUTH AT  BEDTIME    buPROPion (WELLBUTRIN XL) 300 MG 24 hr tablet Take 1 tablet (300 mg total) by mouth in the morning.    carboxymethylcellulose (REFRESH PLUS) 0.5 % SOLN Place 1 drop into both eyes 3 (three) times daily as needed (dry eyes/irritation).    cyclobenzaprine (FLEXERIL) 10 MG tablet Take 1 tablet (10 mg total) by mouth 3 (three) times daily as needed for muscle spasms.    dicyclomine (BENTYL) 20 MG tablet Take one tablet every 4-6 hours as needed for abdominal pain    docusate sodium (COLACE) 100 MG capsule Take 100 mg by mouth 2 (two) times daily.    FLUoxetine (PROZAC) 20 MG capsule Take 1 capsule (20 mg total) by mouth every morning.    FLUoxetine (PROZAC) 20 MG capsule Take 1  capsule (20 mg total) by mouth in the morning.    glimepiride (AMARYL) 1 MG tablet Take 1 tablet (1 mg total) by mouth daily before breakfast.    glucose blood (TRUETEST TEST) test strip Used to check blood sugar daily, DX: E11.9    ketoconazole (NIZORAL) 200 MG tablet Take 100 mg by mouth See admin instructions. Take 1/2 tablet (100 mg) by mouth daily for 30 days - as needed for yeast infection    lidocaine (XYLOCAINE) 5 % ointment Apply 1 application topically 3 (three) times daily as needed for moderate pain.    lidocaine-prilocaine (EMLA) cream Apply 1 application topically See admin instructions. Apply topically to port access one hour prior to dialysis, cover with plastic- Tuesday, Thursday, Saturday    loratadine (CLARITIN) 10 MG tablet Take 10 mg by mouth daily as needed for allergies.    LORazepam (ATIVAN) 2 MG tablet Take 1 tablet (2 mg total) by mouth 2 (two) times daily as needed for anxiety    LORazepam (ATIVAN) 2 MG tablet Take 1 tablet (2 mg total) by mouth 2 (two) times daily as needed for anxiety    midodrine (PROAMATINE) 5 MG tablet Take 1-2 tablet by mouth three times a week as directed Take 30 minutes before dialysis and another tablet during middle of dialysis treatment (Patient taking differently: Take 5 mg by mouth See admin instructions. Take one tablet (5 mg) by mouth 30  minutes before dialysis (Tuesday, Thursday, Saturday); take one tablet (5 mg) during dialysis as needed for SBP <80) 09/14/2021: 2 tablets on Saturday (1st time pt had taken the 2nd tablet)   multivitamin-lutein (OCUVITE-LUTEIN) CAPS capsule Take 1 capsule by mouth every morning.    naloxone (NARCAN) nasal spray 4 mg/0.1 mL Take by nasal route every 3 minutes until patient awakes or EMS arrives.    oxyCODONE (ROXICODONE) 15 MG immediate release tablet Take 1 tablet (15 mg total) by mouth 4 (four) times daily as needed.    pantoprazole (PROTONIX) 40 MG tablet TAKE 1 TABLET BY MOUTH  DAILY    pilocarpine (SALAGEN)  5 MG tablet TAKE 1 TABLET BY MOUTH  TWICE DAILY    pioglitazone (ACTOS) 15 MG tablet TAKE 1 TABLET BY MOUTH  DAILY    polyethylene glycol (MIRALAX / GLYCOLAX) 17 g packet Take 17 g by mouth See admin instructions. Mix 17 g powder in 1/2 cup liquid and drink by mouth on Tuesday and Saturday evenings    sevelamer carbonate (RENVELA) 800 MG tablet Take 800-1,600 mg by mouth See admin instructions. Take 2 tablets (1600 mg) by mouth three times daily with meals, take 1 tablet (800 mg) with snacks    triamcinolone (NASACORT) 55 MCG/ACT AERO nasal inhaler Place 2 sprays into the nose daily as needed (congestion).    No facility-administered encounter medications on file as of 02/01/2022.   Have you had any problems recently with your health?Patient states that she does not have any new health issues  Have you had any problems with your pharmacy?Patient states that she does not have any problems with getting medications or the cost of medications from the pharmacy  What issues or side effects are you having with your medications?Patient states that she does not have any side effects from medications  What would you like me to pass along to [facility] for them to help you with? Patient states that she is doing well and does not have any concerns about her health or medications  What can we do to take care of you better? Patient states that she does not need anything at this time   Care Gaps: Colonoscopy-02/04/15 Diabetic Foot Exam-04/28/16 Mammogram-05/13/21 Ophthalmology-04/13/15 Dexa Scan - NA Annual Well Visit - NA Micro albumin-NA Hemoglobin A1c- 12/15/20   Star Rating Drugs: Pioglitazone 15 mg-last fill 12/25/21 90 ds   Ethelene Hal Clinical Pharmacist Assistant [phone]

## 2022-02-02 DIAGNOSIS — D631 Anemia in chronic kidney disease: Secondary | ICD-10-CM | POA: Diagnosis not present

## 2022-02-02 DIAGNOSIS — N186 End stage renal disease: Secondary | ICD-10-CM | POA: Diagnosis not present

## 2022-02-02 DIAGNOSIS — N2581 Secondary hyperparathyroidism of renal origin: Secondary | ICD-10-CM | POA: Diagnosis not present

## 2022-02-02 DIAGNOSIS — Z992 Dependence on renal dialysis: Secondary | ICD-10-CM | POA: Diagnosis not present

## 2022-02-02 DIAGNOSIS — D688 Other specified coagulation defects: Secondary | ICD-10-CM | POA: Diagnosis not present

## 2022-02-04 ENCOUNTER — Other Ambulatory Visit: Payer: Self-pay

## 2022-02-04 ENCOUNTER — Other Ambulatory Visit (HOSPITAL_COMMUNITY): Payer: Self-pay

## 2022-02-04 DIAGNOSIS — N186 End stage renal disease: Secondary | ICD-10-CM | POA: Diagnosis not present

## 2022-02-04 DIAGNOSIS — D631 Anemia in chronic kidney disease: Secondary | ICD-10-CM | POA: Diagnosis not present

## 2022-02-04 DIAGNOSIS — Z992 Dependence on renal dialysis: Secondary | ICD-10-CM | POA: Diagnosis not present

## 2022-02-04 DIAGNOSIS — D688 Other specified coagulation defects: Secondary | ICD-10-CM | POA: Diagnosis not present

## 2022-02-04 DIAGNOSIS — N2581 Secondary hyperparathyroidism of renal origin: Secondary | ICD-10-CM | POA: Diagnosis not present

## 2022-02-04 MED ORDER — OXYCODONE HCL 15 MG PO TABS
15.0000 mg | ORAL_TABLET | Freq: Four times a day (QID) | ORAL | 0 refills | Status: DC | PRN
  Filled 2022-02-04 – 2022-02-08 (×2): qty 120, 30d supply, fill #0

## 2022-02-06 DIAGNOSIS — Z992 Dependence on renal dialysis: Secondary | ICD-10-CM | POA: Diagnosis not present

## 2022-02-06 DIAGNOSIS — D631 Anemia in chronic kidney disease: Secondary | ICD-10-CM | POA: Diagnosis not present

## 2022-02-06 DIAGNOSIS — N186 End stage renal disease: Secondary | ICD-10-CM | POA: Diagnosis not present

## 2022-02-06 DIAGNOSIS — D688 Other specified coagulation defects: Secondary | ICD-10-CM | POA: Diagnosis not present

## 2022-02-06 DIAGNOSIS — N2581 Secondary hyperparathyroidism of renal origin: Secondary | ICD-10-CM | POA: Diagnosis not present

## 2022-02-08 ENCOUNTER — Other Ambulatory Visit (HOSPITAL_COMMUNITY): Payer: Self-pay

## 2022-02-09 DIAGNOSIS — D688 Other specified coagulation defects: Secondary | ICD-10-CM | POA: Diagnosis not present

## 2022-02-09 DIAGNOSIS — E1022 Type 1 diabetes mellitus with diabetic chronic kidney disease: Secondary | ICD-10-CM | POA: Diagnosis not present

## 2022-02-09 DIAGNOSIS — Z992 Dependence on renal dialysis: Secondary | ICD-10-CM | POA: Diagnosis not present

## 2022-02-09 DIAGNOSIS — N186 End stage renal disease: Secondary | ICD-10-CM | POA: Diagnosis not present

## 2022-02-09 DIAGNOSIS — D631 Anemia in chronic kidney disease: Secondary | ICD-10-CM | POA: Diagnosis not present

## 2022-02-09 DIAGNOSIS — N2581 Secondary hyperparathyroidism of renal origin: Secondary | ICD-10-CM | POA: Diagnosis not present

## 2022-02-11 DIAGNOSIS — N2581 Secondary hyperparathyroidism of renal origin: Secondary | ICD-10-CM | POA: Diagnosis not present

## 2022-02-11 DIAGNOSIS — Z992 Dependence on renal dialysis: Secondary | ICD-10-CM | POA: Diagnosis not present

## 2022-02-11 DIAGNOSIS — N186 End stage renal disease: Secondary | ICD-10-CM | POA: Diagnosis not present

## 2022-02-11 DIAGNOSIS — D688 Other specified coagulation defects: Secondary | ICD-10-CM | POA: Diagnosis not present

## 2022-02-13 DIAGNOSIS — D688 Other specified coagulation defects: Secondary | ICD-10-CM | POA: Diagnosis not present

## 2022-02-13 DIAGNOSIS — Z992 Dependence on renal dialysis: Secondary | ICD-10-CM | POA: Diagnosis not present

## 2022-02-13 DIAGNOSIS — N2581 Secondary hyperparathyroidism of renal origin: Secondary | ICD-10-CM | POA: Diagnosis not present

## 2022-02-13 DIAGNOSIS — N186 End stage renal disease: Secondary | ICD-10-CM | POA: Diagnosis not present

## 2022-02-15 ENCOUNTER — Other Ambulatory Visit: Payer: Self-pay | Admitting: Obstetrics and Gynecology

## 2022-02-15 DIAGNOSIS — Z1231 Encounter for screening mammogram for malignant neoplasm of breast: Secondary | ICD-10-CM

## 2022-02-16 DIAGNOSIS — N186 End stage renal disease: Secondary | ICD-10-CM | POA: Diagnosis not present

## 2022-02-16 DIAGNOSIS — N2581 Secondary hyperparathyroidism of renal origin: Secondary | ICD-10-CM | POA: Diagnosis not present

## 2022-02-16 DIAGNOSIS — Z992 Dependence on renal dialysis: Secondary | ICD-10-CM | POA: Diagnosis not present

## 2022-02-16 DIAGNOSIS — D688 Other specified coagulation defects: Secondary | ICD-10-CM | POA: Diagnosis not present

## 2022-02-18 DIAGNOSIS — Z992 Dependence on renal dialysis: Secondary | ICD-10-CM | POA: Diagnosis not present

## 2022-02-18 DIAGNOSIS — N186 End stage renal disease: Secondary | ICD-10-CM | POA: Diagnosis not present

## 2022-02-18 DIAGNOSIS — D688 Other specified coagulation defects: Secondary | ICD-10-CM | POA: Diagnosis not present

## 2022-02-18 DIAGNOSIS — N2581 Secondary hyperparathyroidism of renal origin: Secondary | ICD-10-CM | POA: Diagnosis not present

## 2022-02-20 DIAGNOSIS — N2581 Secondary hyperparathyroidism of renal origin: Secondary | ICD-10-CM | POA: Diagnosis not present

## 2022-02-20 DIAGNOSIS — Z992 Dependence on renal dialysis: Secondary | ICD-10-CM | POA: Diagnosis not present

## 2022-02-20 DIAGNOSIS — D688 Other specified coagulation defects: Secondary | ICD-10-CM | POA: Diagnosis not present

## 2022-02-20 DIAGNOSIS — N186 End stage renal disease: Secondary | ICD-10-CM | POA: Diagnosis not present

## 2022-02-23 DIAGNOSIS — D688 Other specified coagulation defects: Secondary | ICD-10-CM | POA: Diagnosis not present

## 2022-02-23 DIAGNOSIS — N186 End stage renal disease: Secondary | ICD-10-CM | POA: Diagnosis not present

## 2022-02-23 DIAGNOSIS — N2581 Secondary hyperparathyroidism of renal origin: Secondary | ICD-10-CM | POA: Diagnosis not present

## 2022-02-23 DIAGNOSIS — Z992 Dependence on renal dialysis: Secondary | ICD-10-CM | POA: Diagnosis not present

## 2022-02-25 DIAGNOSIS — Z992 Dependence on renal dialysis: Secondary | ICD-10-CM | POA: Diagnosis not present

## 2022-02-25 DIAGNOSIS — N186 End stage renal disease: Secondary | ICD-10-CM | POA: Diagnosis not present

## 2022-02-25 DIAGNOSIS — D688 Other specified coagulation defects: Secondary | ICD-10-CM | POA: Diagnosis not present

## 2022-02-25 DIAGNOSIS — N2581 Secondary hyperparathyroidism of renal origin: Secondary | ICD-10-CM | POA: Diagnosis not present

## 2022-02-26 DIAGNOSIS — M542 Cervicalgia: Secondary | ICD-10-CM | POA: Diagnosis not present

## 2022-02-27 DIAGNOSIS — N2581 Secondary hyperparathyroidism of renal origin: Secondary | ICD-10-CM | POA: Diagnosis not present

## 2022-02-27 DIAGNOSIS — D688 Other specified coagulation defects: Secondary | ICD-10-CM | POA: Diagnosis not present

## 2022-02-27 DIAGNOSIS — Z992 Dependence on renal dialysis: Secondary | ICD-10-CM | POA: Diagnosis not present

## 2022-02-27 DIAGNOSIS — N186 End stage renal disease: Secondary | ICD-10-CM | POA: Diagnosis not present

## 2022-03-02 DIAGNOSIS — N186 End stage renal disease: Secondary | ICD-10-CM | POA: Diagnosis not present

## 2022-03-02 DIAGNOSIS — D688 Other specified coagulation defects: Secondary | ICD-10-CM | POA: Diagnosis not present

## 2022-03-02 DIAGNOSIS — N2581 Secondary hyperparathyroidism of renal origin: Secondary | ICD-10-CM | POA: Diagnosis not present

## 2022-03-02 DIAGNOSIS — Z992 Dependence on renal dialysis: Secondary | ICD-10-CM | POA: Diagnosis not present

## 2022-03-04 DIAGNOSIS — Z992 Dependence on renal dialysis: Secondary | ICD-10-CM | POA: Diagnosis not present

## 2022-03-04 DIAGNOSIS — D688 Other specified coagulation defects: Secondary | ICD-10-CM | POA: Diagnosis not present

## 2022-03-04 DIAGNOSIS — N2581 Secondary hyperparathyroidism of renal origin: Secondary | ICD-10-CM | POA: Diagnosis not present

## 2022-03-04 DIAGNOSIS — N186 End stage renal disease: Secondary | ICD-10-CM | POA: Diagnosis not present

## 2022-03-06 DIAGNOSIS — N2581 Secondary hyperparathyroidism of renal origin: Secondary | ICD-10-CM | POA: Diagnosis not present

## 2022-03-06 DIAGNOSIS — N186 End stage renal disease: Secondary | ICD-10-CM | POA: Diagnosis not present

## 2022-03-06 DIAGNOSIS — Z992 Dependence on renal dialysis: Secondary | ICD-10-CM | POA: Diagnosis not present

## 2022-03-06 DIAGNOSIS — D688 Other specified coagulation defects: Secondary | ICD-10-CM | POA: Diagnosis not present

## 2022-03-08 ENCOUNTER — Other Ambulatory Visit (HOSPITAL_COMMUNITY): Payer: Self-pay

## 2022-03-08 MED ORDER — OXYCODONE HCL 15 MG PO TABS
15.0000 mg | ORAL_TABLET | Freq: Four times a day (QID) | ORAL | 0 refills | Status: DC | PRN
  Filled 2022-03-08: qty 120, 30d supply, fill #0

## 2022-03-09 ENCOUNTER — Inpatient Hospital Stay (HOSPITAL_COMMUNITY)
Admission: EM | Admit: 2022-03-09 | Discharge: 2022-03-12 | DRG: 871 | Disposition: A | Discharge status: Home / Self Care | Payer: Medicare Other | Attending: Family Medicine | Admitting: Family Medicine

## 2022-03-09 ENCOUNTER — Other Ambulatory Visit: Payer: Self-pay

## 2022-03-09 ENCOUNTER — Encounter (HOSPITAL_COMMUNITY): Payer: Self-pay | Admitting: Emergency Medicine

## 2022-03-09 DIAGNOSIS — K219 Gastro-esophageal reflux disease without esophagitis: Secondary | ICD-10-CM | POA: Diagnosis present

## 2022-03-09 DIAGNOSIS — I10 Essential (primary) hypertension: Secondary | ICD-10-CM | POA: Diagnosis not present

## 2022-03-09 DIAGNOSIS — E669 Obesity, unspecified: Secondary | ICD-10-CM | POA: Diagnosis present

## 2022-03-09 DIAGNOSIS — K52839 Microscopic colitis, unspecified: Secondary | ICD-10-CM | POA: Diagnosis present

## 2022-03-09 DIAGNOSIS — R509 Fever, unspecified: Secondary | ICD-10-CM | POA: Diagnosis not present

## 2022-03-09 DIAGNOSIS — Z888 Allergy status to other drugs, medicaments and biological substances status: Secondary | ICD-10-CM

## 2022-03-09 DIAGNOSIS — R000 Tachycardia, unspecified: Secondary | ICD-10-CM | POA: Diagnosis not present

## 2022-03-09 DIAGNOSIS — R6521 Severe sepsis with septic shock: Secondary | ICD-10-CM | POA: Diagnosis not present

## 2022-03-09 DIAGNOSIS — I953 Hypotension of hemodialysis: Secondary | ICD-10-CM | POA: Diagnosis not present

## 2022-03-09 DIAGNOSIS — Z96651 Presence of right artificial knee joint: Secondary | ICD-10-CM | POA: Diagnosis present

## 2022-03-09 DIAGNOSIS — Z992 Dependence on renal dialysis: Secondary | ICD-10-CM

## 2022-03-09 DIAGNOSIS — N250 Renal osteodystrophy: Secondary | ICD-10-CM | POA: Diagnosis not present

## 2022-03-09 DIAGNOSIS — A4150 Gram-negative sepsis, unspecified: Secondary | ICD-10-CM | POA: Diagnosis not present

## 2022-03-09 DIAGNOSIS — S0990XA Unspecified injury of head, initial encounter: Secondary | ICD-10-CM | POA: Diagnosis not present

## 2022-03-09 DIAGNOSIS — R933 Abnormal findings on diagnostic imaging of other parts of digestive tract: Secondary | ICD-10-CM | POA: Diagnosis not present

## 2022-03-09 DIAGNOSIS — Z83438 Family history of other disorder of lipoprotein metabolism and other lipidemia: Secondary | ICD-10-CM

## 2022-03-09 DIAGNOSIS — E875 Hyperkalemia: Secondary | ICD-10-CM | POA: Diagnosis present

## 2022-03-09 DIAGNOSIS — A09 Infectious gastroenteritis and colitis, unspecified: Secondary | ICD-10-CM | POA: Diagnosis not present

## 2022-03-09 DIAGNOSIS — D631 Anemia in chronic kidney disease: Secondary | ICD-10-CM | POA: Diagnosis present

## 2022-03-09 DIAGNOSIS — K559 Vascular disorder of intestine, unspecified: Secondary | ICD-10-CM | POA: Diagnosis not present

## 2022-03-09 DIAGNOSIS — Z041 Encounter for examination and observation following transport accident: Secondary | ICD-10-CM | POA: Diagnosis not present

## 2022-03-09 DIAGNOSIS — K633 Ulcer of intestine: Secondary | ICD-10-CM | POA: Diagnosis present

## 2022-03-09 DIAGNOSIS — K641 Second degree hemorrhoids: Secondary | ICD-10-CM | POA: Diagnosis present

## 2022-03-09 DIAGNOSIS — N186 End stage renal disease: Secondary | ICD-10-CM

## 2022-03-09 DIAGNOSIS — Y92410 Unspecified street and highway as the place of occurrence of the external cause: Secondary | ICD-10-CM

## 2022-03-09 DIAGNOSIS — Z8249 Family history of ischemic heart disease and other diseases of the circulatory system: Secondary | ICD-10-CM

## 2022-03-09 DIAGNOSIS — M47812 Spondylosis without myelopathy or radiculopathy, cervical region: Secondary | ICD-10-CM | POA: Diagnosis not present

## 2022-03-09 DIAGNOSIS — E876 Hypokalemia: Secondary | ICD-10-CM | POA: Diagnosis not present

## 2022-03-09 DIAGNOSIS — A419 Sepsis, unspecified organism: Principal | ICD-10-CM | POA: Diagnosis present

## 2022-03-09 DIAGNOSIS — Z818 Family history of other mental and behavioral disorders: Secondary | ICD-10-CM

## 2022-03-09 DIAGNOSIS — Z881 Allergy status to other antibiotic agents status: Secondary | ICD-10-CM

## 2022-03-09 DIAGNOSIS — E1140 Type 2 diabetes mellitus with diabetic neuropathy, unspecified: Secondary | ICD-10-CM | POA: Diagnosis not present

## 2022-03-09 DIAGNOSIS — I120 Hypertensive chronic kidney disease with stage 5 chronic kidney disease or end stage renal disease: Secondary | ICD-10-CM | POA: Diagnosis present

## 2022-03-09 DIAGNOSIS — K5289 Other specified noninfective gastroenteritis and colitis: Secondary | ICD-10-CM | POA: Diagnosis not present

## 2022-03-09 DIAGNOSIS — D688 Other specified coagulation defects: Secondary | ICD-10-CM | POA: Diagnosis not present

## 2022-03-09 DIAGNOSIS — E872 Acidosis, unspecified: Secondary | ICD-10-CM | POA: Diagnosis present

## 2022-03-09 DIAGNOSIS — F419 Anxiety disorder, unspecified: Secondary | ICD-10-CM | POA: Diagnosis present

## 2022-03-09 DIAGNOSIS — F32A Depression, unspecified: Secondary | ICD-10-CM | POA: Diagnosis not present

## 2022-03-09 DIAGNOSIS — K5730 Diverticulosis of large intestine without perforation or abscess without bleeding: Secondary | ICD-10-CM | POA: Diagnosis present

## 2022-03-09 DIAGNOSIS — K5731 Diverticulosis of large intestine without perforation or abscess with bleeding: Secondary | ICD-10-CM | POA: Diagnosis not present

## 2022-03-09 DIAGNOSIS — Z20822 Contact with and (suspected) exposure to covid-19: Secondary | ICD-10-CM | POA: Diagnosis not present

## 2022-03-09 DIAGNOSIS — E1122 Type 2 diabetes mellitus with diabetic chronic kidney disease: Secondary | ICD-10-CM | POA: Diagnosis not present

## 2022-03-09 DIAGNOSIS — R1084 Generalized abdominal pain: Secondary | ICD-10-CM | POA: Diagnosis not present

## 2022-03-09 DIAGNOSIS — K5190 Ulcerative colitis, unspecified, without complications: Secondary | ICD-10-CM | POA: Diagnosis not present

## 2022-03-09 DIAGNOSIS — K529 Noninfective gastroenteritis and colitis, unspecified: Secondary | ICD-10-CM | POA: Diagnosis not present

## 2022-03-09 DIAGNOSIS — Z823 Family history of stroke: Secondary | ICD-10-CM

## 2022-03-09 DIAGNOSIS — K625 Hemorrhage of anus and rectum: Secondary | ICD-10-CM | POA: Diagnosis not present

## 2022-03-09 DIAGNOSIS — N2581 Secondary hyperparathyroidism of renal origin: Secondary | ICD-10-CM | POA: Diagnosis not present

## 2022-03-09 DIAGNOSIS — Z8042 Family history of malignant neoplasm of prostate: Secondary | ICD-10-CM

## 2022-03-09 DIAGNOSIS — E1022 Type 1 diabetes mellitus with diabetic chronic kidney disease: Secondary | ICD-10-CM | POA: Diagnosis not present

## 2022-03-09 DIAGNOSIS — I959 Hypotension, unspecified: Secondary | ICD-10-CM | POA: Diagnosis not present

## 2022-03-09 DIAGNOSIS — Z833 Family history of diabetes mellitus: Secondary | ICD-10-CM

## 2022-03-09 DIAGNOSIS — G8929 Other chronic pain: Secondary | ICD-10-CM | POA: Diagnosis present

## 2022-03-09 DIAGNOSIS — Z87891 Personal history of nicotine dependence: Secondary | ICD-10-CM

## 2022-03-09 DIAGNOSIS — S3991XA Unspecified injury of abdomen, initial encounter: Secondary | ICD-10-CM | POA: Diagnosis not present

## 2022-03-09 DIAGNOSIS — E785 Hyperlipidemia, unspecified: Secondary | ICD-10-CM | POA: Diagnosis present

## 2022-03-09 DIAGNOSIS — M1990 Unspecified osteoarthritis, unspecified site: Secondary | ICD-10-CM | POA: Diagnosis present

## 2022-03-09 DIAGNOSIS — M797 Fibromyalgia: Secondary | ICD-10-CM | POA: Diagnosis present

## 2022-03-09 DIAGNOSIS — S299XXA Unspecified injury of thorax, initial encounter: Secondary | ICD-10-CM | POA: Diagnosis not present

## 2022-03-09 DIAGNOSIS — M8580 Other specified disorders of bone density and structure, unspecified site: Secondary | ICD-10-CM | POA: Diagnosis present

## 2022-03-09 DIAGNOSIS — Z79899 Other long term (current) drug therapy: Secondary | ICD-10-CM

## 2022-03-09 DIAGNOSIS — Z7984 Long term (current) use of oral hypoglycemic drugs: Secondary | ICD-10-CM

## 2022-03-09 DIAGNOSIS — Z86011 Personal history of benign neoplasm of the brain: Secondary | ICD-10-CM

## 2022-03-09 LAB — CBC WITH DIFFERENTIAL/PLATELET
Abs Immature Granulocytes: 0.1 10*3/uL — ABNORMAL HIGH (ref 0.00–0.07)
Basophils Absolute: 0.1 10*3/uL (ref 0.0–0.1)
Basophils Relative: 0 %
Eosinophils Absolute: 0.1 10*3/uL (ref 0.0–0.5)
Eosinophils Relative: 1 %
HCT: 38.1 % (ref 36.0–46.0)
Hemoglobin: 12.2 g/dL (ref 12.0–15.0)
Immature Granulocytes: 1 %
Lymphocytes Relative: 3 %
Lymphs Abs: 0.5 10*3/uL — ABNORMAL LOW (ref 0.7–4.0)
MCH: 31.9 pg (ref 26.0–34.0)
MCHC: 32 g/dL (ref 30.0–36.0)
MCV: 99.5 fL (ref 80.0–100.0)
Monocytes Absolute: 1 10*3/uL (ref 0.1–1.0)
Monocytes Relative: 6 %
Neutro Abs: 15.5 10*3/uL — ABNORMAL HIGH (ref 1.7–7.7)
Neutrophils Relative %: 89 %
Platelets: 186 10*3/uL (ref 150–400)
RBC: 3.83 MIL/uL — ABNORMAL LOW (ref 3.87–5.11)
RDW: 13 % (ref 11.5–15.5)
WBC: 17.3 10*3/uL — ABNORMAL HIGH (ref 4.0–10.5)
nRBC: 0 % (ref 0.0–0.2)

## 2022-03-09 MED ORDER — ACETAMINOPHEN 500 MG PO TABS
1000.0000 mg | ORAL_TABLET | Freq: Once | ORAL | Status: AC
  Administered 2022-03-09: 1000 mg via ORAL
  Filled 2022-03-09: qty 2
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
## 2022-03-10 ENCOUNTER — Emergency Department (HOSPITAL_COMMUNITY): Payer: Medicare Other

## 2022-03-10 DIAGNOSIS — Z041 Encounter for examination and observation following transport accident: Secondary | ICD-10-CM | POA: Diagnosis not present

## 2022-03-10 DIAGNOSIS — Y92410 Unspecified street and highway as the place of occurrence of the external cause: Secondary | ICD-10-CM | POA: Diagnosis not present

## 2022-03-10 DIAGNOSIS — Z992 Dependence on renal dialysis: Secondary | ICD-10-CM

## 2022-03-10 DIAGNOSIS — M47812 Spondylosis without myelopathy or radiculopathy, cervical region: Secondary | ICD-10-CM | POA: Diagnosis not present

## 2022-03-10 DIAGNOSIS — N250 Renal osteodystrophy: Secondary | ICD-10-CM | POA: Diagnosis not present

## 2022-03-10 DIAGNOSIS — K625 Hemorrhage of anus and rectum: Secondary | ICD-10-CM

## 2022-03-10 DIAGNOSIS — K5730 Diverticulosis of large intestine without perforation or abscess without bleeding: Secondary | ICD-10-CM | POA: Diagnosis not present

## 2022-03-10 DIAGNOSIS — E785 Hyperlipidemia, unspecified: Secondary | ICD-10-CM | POA: Diagnosis present

## 2022-03-10 DIAGNOSIS — R1084 Generalized abdominal pain: Secondary | ICD-10-CM | POA: Diagnosis not present

## 2022-03-10 DIAGNOSIS — A09 Infectious gastroenteritis and colitis, unspecified: Secondary | ICD-10-CM | POA: Diagnosis present

## 2022-03-10 DIAGNOSIS — I10 Essential (primary) hypertension: Secondary | ICD-10-CM | POA: Diagnosis not present

## 2022-03-10 DIAGNOSIS — R509 Fever, unspecified: Secondary | ICD-10-CM | POA: Diagnosis not present

## 2022-03-10 DIAGNOSIS — K219 Gastro-esophageal reflux disease without esophagitis: Secondary | ICD-10-CM | POA: Diagnosis not present

## 2022-03-10 DIAGNOSIS — E1022 Type 1 diabetes mellitus with diabetic chronic kidney disease: Secondary | ICD-10-CM | POA: Diagnosis not present

## 2022-03-10 DIAGNOSIS — A419 Sepsis, unspecified organism: Secondary | ICD-10-CM | POA: Diagnosis present

## 2022-03-10 DIAGNOSIS — S299XXA Unspecified injury of thorax, initial encounter: Secondary | ICD-10-CM | POA: Diagnosis not present

## 2022-03-10 DIAGNOSIS — K633 Ulcer of intestine: Secondary | ICD-10-CM | POA: Diagnosis present

## 2022-03-10 DIAGNOSIS — K559 Vascular disorder of intestine, unspecified: Secondary | ICD-10-CM | POA: Diagnosis present

## 2022-03-10 DIAGNOSIS — R933 Abnormal findings on diagnostic imaging of other parts of digestive tract: Secondary | ICD-10-CM

## 2022-03-10 DIAGNOSIS — S3991XA Unspecified injury of abdomen, initial encounter: Secondary | ICD-10-CM | POA: Diagnosis not present

## 2022-03-10 DIAGNOSIS — E872 Acidosis, unspecified: Secondary | ICD-10-CM | POA: Diagnosis present

## 2022-03-10 DIAGNOSIS — Z20822 Contact with and (suspected) exposure to covid-19: Secondary | ICD-10-CM | POA: Diagnosis present

## 2022-03-10 DIAGNOSIS — E1140 Type 2 diabetes mellitus with diabetic neuropathy, unspecified: Secondary | ICD-10-CM | POA: Diagnosis present

## 2022-03-10 DIAGNOSIS — E876 Hypokalemia: Secondary | ICD-10-CM | POA: Diagnosis not present

## 2022-03-10 DIAGNOSIS — A4150 Gram-negative sepsis, unspecified: Secondary | ICD-10-CM | POA: Diagnosis present

## 2022-03-10 DIAGNOSIS — K5190 Ulcerative colitis, unspecified, without complications: Secondary | ICD-10-CM | POA: Diagnosis not present

## 2022-03-10 DIAGNOSIS — N186 End stage renal disease: Secondary | ICD-10-CM | POA: Diagnosis not present

## 2022-03-10 DIAGNOSIS — E669 Obesity, unspecified: Secondary | ICD-10-CM | POA: Diagnosis present

## 2022-03-10 DIAGNOSIS — K529 Noninfective gastroenteritis and colitis, unspecified: Secondary | ICD-10-CM | POA: Diagnosis present

## 2022-03-10 DIAGNOSIS — D631 Anemia in chronic kidney disease: Secondary | ICD-10-CM | POA: Diagnosis not present

## 2022-03-10 DIAGNOSIS — N2581 Secondary hyperparathyroidism of renal origin: Secondary | ICD-10-CM | POA: Diagnosis present

## 2022-03-10 DIAGNOSIS — K5289 Other specified noninfective gastroenteritis and colitis: Secondary | ICD-10-CM | POA: Diagnosis not present

## 2022-03-10 DIAGNOSIS — K641 Second degree hemorrhoids: Secondary | ICD-10-CM | POA: Diagnosis not present

## 2022-03-10 DIAGNOSIS — E1122 Type 2 diabetes mellitus with diabetic chronic kidney disease: Secondary | ICD-10-CM | POA: Diagnosis not present

## 2022-03-10 DIAGNOSIS — K5731 Diverticulosis of large intestine without perforation or abscess with bleeding: Secondary | ICD-10-CM | POA: Diagnosis not present

## 2022-03-10 DIAGNOSIS — R6521 Severe sepsis with septic shock: Secondary | ICD-10-CM | POA: Diagnosis present

## 2022-03-10 DIAGNOSIS — F419 Anxiety disorder, unspecified: Secondary | ICD-10-CM | POA: Diagnosis present

## 2022-03-10 DIAGNOSIS — I953 Hypotension of hemodialysis: Secondary | ICD-10-CM | POA: Diagnosis present

## 2022-03-10 DIAGNOSIS — F32A Depression, unspecified: Secondary | ICD-10-CM | POA: Diagnosis present

## 2022-03-10 DIAGNOSIS — I959 Hypotension, unspecified: Secondary | ICD-10-CM | POA: Diagnosis not present

## 2022-03-10 DIAGNOSIS — I120 Hypertensive chronic kidney disease with stage 5 chronic kidney disease or end stage renal disease: Secondary | ICD-10-CM | POA: Diagnosis not present

## 2022-03-10 DIAGNOSIS — E875 Hyperkalemia: Secondary | ICD-10-CM | POA: Diagnosis not present

## 2022-03-10 DIAGNOSIS — G8929 Other chronic pain: Secondary | ICD-10-CM | POA: Diagnosis present

## 2022-03-10 LAB — COMPREHENSIVE METABOLIC PANEL
ALT: 21 U/L (ref 0–44)
ALT: 28 U/L (ref 0–44)
AST: 24 U/L (ref 15–41)
AST: 34 U/L (ref 15–41)
Albumin: 2.8 g/dL — ABNORMAL LOW (ref 3.5–5.0)
Albumin: 3.7 g/dL (ref 3.5–5.0)
Alkaline Phosphatase: 107 U/L (ref 38–126)
Alkaline Phosphatase: 142 U/L — ABNORMAL HIGH (ref 38–126)
Anion gap: 12 (ref 5–15)
Anion gap: 15 (ref 5–15)
BUN: 29 mg/dL — ABNORMAL HIGH (ref 8–23)
BUN: 37 mg/dL — ABNORMAL HIGH (ref 8–23)
CO2: 27 mmol/L (ref 22–32)
CO2: 27 mmol/L (ref 22–32)
Calcium: 8.9 mg/dL (ref 8.9–10.3)
Calcium: 9.8 mg/dL (ref 8.9–10.3)
Chloride: 93 mmol/L — ABNORMAL LOW (ref 98–111)
Chloride: 94 mmol/L — ABNORMAL LOW (ref 98–111)
Creatinine, Ser: 3.89 mg/dL — ABNORMAL HIGH (ref 0.44–1.00)
Creatinine, Ser: 4.12 mg/dL — ABNORMAL HIGH (ref 0.44–1.00)
GFR, Estimated: 11 mL/min — ABNORMAL LOW (ref >60)
GFR, Estimated: 12 mL/min — ABNORMAL LOW (ref >60)
Glucose, Bld: 110 mg/dL — ABNORMAL HIGH (ref 70–99)
Glucose, Bld: 148 mg/dL — ABNORMAL HIGH (ref 70–99)
Potassium: 3.9 mmol/L (ref 3.5–5.1)
Potassium: 5.4 mmol/L — ABNORMAL HIGH (ref 3.5–5.1)
Sodium: 133 mmol/L — ABNORMAL LOW (ref 135–145)
Sodium: 135 mmol/L (ref 135–145)
Total Bilirubin: 0.7 mg/dL (ref 0.3–1.2)
Total Bilirubin: 0.9 mg/dL (ref 0.3–1.2)
Total Protein: 5.8 g/dL — ABNORMAL LOW (ref 6.5–8.1)
Total Protein: 7.3 g/dL (ref 6.5–8.1)

## 2022-03-10 LAB — LACTIC ACID, PLASMA
Lactic Acid, Venous: 2.3 mmol/L (ref 0.5–1.9)
Lactic Acid, Venous: 2.4 mmol/L (ref 0.5–1.9)
Lactic Acid, Venous: 2.8 mmol/L (ref 0.5–1.9)

## 2022-03-10 LAB — CBC WITH DIFFERENTIAL/PLATELET
Abs Immature Granulocytes: 0.2 10*3/uL — ABNORMAL HIGH (ref 0.00–0.07)
Basophils Absolute: 0.1 10*3/uL (ref 0.0–0.1)
Basophils Relative: 0 %
Eosinophils Absolute: 0.1 10*3/uL (ref 0.0–0.5)
Eosinophils Relative: 0 %
HCT: 32.6 % — ABNORMAL LOW (ref 36.0–46.0)
Hemoglobin: 10.5 g/dL — ABNORMAL LOW (ref 12.0–15.0)
Immature Granulocytes: 1 %
Lymphocytes Relative: 8 %
Lymphs Abs: 1.7 10*3/uL (ref 0.7–4.0)
MCH: 32.2 pg (ref 26.0–34.0)
MCHC: 32.2 g/dL (ref 30.0–36.0)
MCV: 100 fL (ref 80.0–100.0)
Monocytes Absolute: 1.2 10*3/uL — ABNORMAL HIGH (ref 0.1–1.0)
Monocytes Relative: 6 %
Neutro Abs: 18 10*3/uL — ABNORMAL HIGH (ref 1.7–7.7)
Neutrophils Relative %: 85 %
Platelets: 163 10*3/uL (ref 150–400)
RBC: 3.26 MIL/uL — ABNORMAL LOW (ref 3.87–5.11)
RDW: 13.2 % (ref 11.5–15.5)
WBC: 21.2 10*3/uL — ABNORMAL HIGH (ref 4.0–10.5)
nRBC: 0 % (ref 0.0–0.2)

## 2022-03-10 LAB — CBG MONITORING, ED
Glucose-Capillary: 114 mg/dL — ABNORMAL HIGH (ref 70–99)
Glucose-Capillary: 97 mg/dL (ref 70–99)

## 2022-03-10 LAB — HEMOGLOBIN A1C
Hgb A1c MFr Bld: 6.3 % — ABNORMAL HIGH (ref 4.8–5.6)
Mean Plasma Glucose: 134.11 mg/dL

## 2022-03-10 LAB — LIPASE, BLOOD: Lipase: 25 U/L (ref 11–51)

## 2022-03-10 LAB — APTT: aPTT: 32 seconds (ref 24–36)

## 2022-03-10 LAB — RESP PANEL BY RT-PCR (FLU A&B, COVID) ARPGX2
Influenza A by PCR: NEGATIVE
Influenza B by PCR: NEGATIVE
SARS Coronavirus 2 by RT PCR: NEGATIVE

## 2022-03-10 LAB — PROTIME-INR
INR: 1.1 (ref 0.8–1.2)
Prothrombin Time: 13.9 seconds (ref 11.4–15.2)

## 2022-03-10 LAB — GLUCOSE, CAPILLARY: Glucose-Capillary: 100 mg/dL — ABNORMAL HIGH (ref 70–99)

## 2022-03-10 LAB — CORTISOL: Cortisol, Plasma: 16.6 ug/dL

## 2022-03-10 LAB — MAGNESIUM: Magnesium: 2.2 mg/dL (ref 1.7–2.4)

## 2022-03-10 MED ORDER — HYDRALAZINE HCL 20 MG/ML IJ SOLN: 10.0000 mg | Freq: Four times a day (QID) | INTRAMUSCULAR | Status: DC | PRN

## 2022-03-10 MED ORDER — VANCOMYCIN HCL 2000 MG/400ML IV SOLN
2000.0000 mg | Freq: Once | INTRAVENOUS | Status: AC
  Administered 2022-03-10: 2000 mg via INTRAVENOUS
  Filled 2022-03-10: qty 400

## 2022-03-10 MED ORDER — ONDANSETRON HCL 4 MG PO TABS: 4.0000 mg | ORAL_TABLET | Freq: Four times a day (QID) | ORAL | Status: DC | PRN

## 2022-03-10 MED ORDER — PIPERACILLIN-TAZOBACTAM IN DEX 2-0.25 GM/50ML IV SOLN
2.2500 g | Freq: Three times a day (TID) | INTRAVENOUS | Status: DC
  Administered 2022-03-10 – 2022-03-12 (×5): 2.25 g via INTRAVENOUS
  Filled 2022-03-10 (×11): qty 50

## 2022-03-10 MED ORDER — OXYCODONE HCL 5 MG PO TABS
5.0000 mg | ORAL_TABLET | ORAL | Status: DC | PRN
  Administered 2022-03-10: 5 mg via ORAL
  Filled 2022-03-10: qty 1

## 2022-03-10 MED ORDER — OXYCODONE HCL 5 MG PO TABS
15.0000 mg | ORAL_TABLET | Freq: Once | ORAL | Status: AC
  Administered 2022-03-10: 15 mg via ORAL
  Filled 2022-03-10: qty 3

## 2022-03-10 MED ORDER — POLYVINYL ALCOHOL 1.4 % OP SOLN
1.0000 [drp] | Freq: Three times a day (TID) | OPHTHALMIC | Status: DC | PRN
  Filled 2022-03-10: qty 15

## 2022-03-10 MED ORDER — ACETAMINOPHEN 325 MG PO TABS
650.0000 mg | ORAL_TABLET | Freq: Four times a day (QID) | ORAL | Status: DC | PRN
  Administered 2022-03-10 – 2022-03-11 (×4): 650 mg via ORAL
  Filled 2022-03-10 (×4): qty 2

## 2022-03-10 MED ORDER — SEVELAMER CARBONATE 800 MG PO TABS
1600.0000 mg | ORAL_TABLET | Freq: Three times a day (TID) | ORAL | Status: DC
  Administered 2022-03-10 – 2022-03-12 (×3): 1600 mg via ORAL
  Filled 2022-03-10 (×5): qty 2

## 2022-03-10 MED ORDER — SODIUM CHLORIDE 0.9 % IV BOLUS
500.0000 mL | Freq: Once | INTRAVENOUS | Status: AC
  Administered 2022-03-10: 500 mL via INTRAVENOUS

## 2022-03-10 MED ORDER — PANTOPRAZOLE SODIUM 40 MG PO TBEC
40.0000 mg | DELAYED_RELEASE_TABLET | Freq: Every day | ORAL | Status: DC
  Administered 2022-03-10 – 2022-03-12 (×3): 40 mg via ORAL
  Filled 2022-03-10 (×3): qty 1

## 2022-03-10 MED ORDER — METOCLOPRAMIDE HCL 5 MG/ML IJ SOLN
10.0000 mg | Freq: Four times a day (QID) | INTRAMUSCULAR | Status: AC
  Administered 2022-03-10: 10 mg via INTRAVENOUS
  Filled 2022-03-10 (×2): qty 2

## 2022-03-10 MED ORDER — SODIUM CHLORIDE 0.9 % IV SOLN: INTRAVENOUS | Status: DC

## 2022-03-10 MED ORDER — SODIUM ZIRCONIUM CYCLOSILICATE 10 G PO PACK
10.0000 g | PACK | Freq: Once | ORAL | Status: AC
  Administered 2022-03-10: 10 g via ORAL
  Filled 2022-03-10: qty 1

## 2022-03-10 MED ORDER — SODIUM CHLORIDE 0.9 % IV BOLUS: 250.0000 mL | Freq: Once | INTRAVENOUS | Status: DC

## 2022-03-10 MED ORDER — CYCLOBENZAPRINE HCL 10 MG PO TABS: 10.0000 mg | ORAL_TABLET | Freq: Three times a day (TID) | ORAL | Status: DC | PRN

## 2022-03-10 MED ORDER — MIDODRINE HCL 5 MG PO TABS: 5.0000 mg | ORAL_TABLET | ORAL | Status: DC

## 2022-03-10 MED ORDER — RENA-VITE PO TABS
1.0000 | ORAL_TABLET | Freq: Every day | ORAL | Status: DC
  Administered 2022-03-10 – 2022-03-11 (×2): 1 via ORAL
  Filled 2022-03-10 (×3): qty 1

## 2022-03-10 MED ORDER — FLUOXETINE HCL 20 MG PO CAPS
20.0000 mg | ORAL_CAPSULE | Freq: Every morning | ORAL | Status: DC
  Administered 2022-03-10 – 2022-03-12 (×3): 20 mg via ORAL
  Filled 2022-03-10 (×2): qty 1

## 2022-03-10 MED ORDER — PEG-KCL-NACL-NASULF-NA ASC-C 100 G PO SOLR
0.5000 | Freq: Once | ORAL | Status: AC
  Administered 2022-03-10: 100 g via ORAL
  Filled 2022-03-10 (×2): qty 1

## 2022-03-10 MED ORDER — SODIUM CHLORIDE 0.9 % IV BOLUS
250.0000 mL | Freq: Once | INTRAVENOUS | Status: AC
Start: 2022-03-10 — End: 2022-03-10
  Administered 2022-03-10: 250 mL via INTRAVENOUS

## 2022-03-10 MED ORDER — BUPROPION HCL ER (XL) 150 MG PO TB24
300.0000 mg | ORAL_TABLET | Freq: Every day | ORAL | Status: DC
  Administered 2022-03-11 – 2022-03-12 (×2): 300 mg via ORAL
  Filled 2022-03-10: qty 2
  Filled 2022-03-10: qty 1
  Filled 2022-03-10: qty 2

## 2022-03-10 MED ORDER — SODIUM CHLORIDE 0.9 % IV BOLUS
250.0000 mL | Freq: Once | INTRAVENOUS | Status: AC
  Administered 2022-03-10: 250 mL via INTRAVENOUS

## 2022-03-10 MED ORDER — PEG-KCL-NACL-NASULF-NA ASC-C 100 G PO SOLR: 1.0000 | Freq: Once | ORAL | Status: DC

## 2022-03-10 MED ORDER — ONDANSETRON HCL 4 MG/2ML IJ SOLN: 4.0000 mg | Freq: Four times a day (QID) | INTRAMUSCULAR | Status: DC | PRN

## 2022-03-10 MED ORDER — VANCOMYCIN HCL IN DEXTROSE 1-5 GM/200ML-% IV SOLN
1000.0000 mg | INTRAVENOUS | Status: DC
  Filled 2022-03-10: qty 200

## 2022-03-10 MED ORDER — HEPARIN SODIUM (PORCINE) 5000 UNIT/ML IJ SOLN
5000.0000 [IU] | Freq: Three times a day (TID) | INTRAMUSCULAR | Status: DC
  Administered 2022-03-10 – 2022-03-12 (×5): 5000 [IU] via SUBCUTANEOUS
  Filled 2022-03-10 (×6): qty 1

## 2022-03-10 MED ORDER — CHLORHEXIDINE GLUCONATE CLOTH 2 % EX PADS
6.0000 | MEDICATED_PAD | Freq: Every day | CUTANEOUS | Status: DC
  Administered 2022-03-11 – 2022-03-12 (×2): 6 via TOPICAL

## 2022-03-10 MED ORDER — SEVELAMER CARBONATE 800 MG PO TABS
800.0000 mg | ORAL_TABLET | Freq: Two times a day (BID) | ORAL | Status: DC | PRN
Start: 2022-03-10 — End: 2022-03-12

## 2022-03-10 MED ORDER — PIPERACILLIN-TAZOBACTAM 3.375 G IVPB 30 MIN
3.3750 g | Freq: Once | INTRAVENOUS | Status: AC
  Administered 2022-03-10: 3.375 g via INTRAVENOUS
  Filled 2022-03-10: qty 50

## 2022-03-10 MED ORDER — SEVELAMER CARBONATE 800 MG PO TABS: 800.0000 mg | ORAL_TABLET | ORAL | Status: DC

## 2022-03-10 MED ORDER — INSULIN ASPART 100 UNIT/ML IJ SOLN: 0.0000 [IU] | Freq: Three times a day (TID) | INTRAMUSCULAR | Status: DC

## 2022-03-10 MED ORDER — LORAZEPAM 1 MG PO TABS: 2.0000 mg | ORAL_TABLET | Freq: Two times a day (BID) | ORAL | Status: DC | PRN

## 2022-03-10 MED ORDER — MORPHINE SULFATE (PF) 2 MG/ML IV SOLN
1.0000 mg | INTRAVENOUS | Status: DC | PRN
Start: 2022-03-10 — End: 2022-03-12
  Administered 2022-03-11: 1 mg via INTRAVENOUS
  Filled 2022-03-10: qty 1

## 2022-03-10 MED ORDER — MIDODRINE HCL 5 MG PO TABS
5.0000 mg | ORAL_TABLET | ORAL | Status: DC
Start: 2022-03-10 — End: 2022-03-10

## 2022-03-10 MED ORDER — ACETAMINOPHEN 650 MG RE SUPP: 650.0000 mg | Freq: Four times a day (QID) | RECTAL | Status: DC | PRN

## 2022-03-10 MED ORDER — MIDODRINE HCL 5 MG PO TABS
10.0000 mg | ORAL_TABLET | ORAL | Status: DC
  Administered 2022-03-11: 10 mg via ORAL
  Filled 2022-03-10: qty 2

## 2022-03-10 MED ORDER — PEG-KCL-NACL-NASULF-NA ASC-C 100 G PO SOLR
0.5000 | Freq: Once | ORAL | Status: AC
  Administered 2022-03-10: 100 g via ORAL
  Filled 2022-03-10: qty 1

## 2022-03-10 MED ORDER — IOHEXOL 300 MG/ML  SOLN
100.0000 mL | Freq: Once | INTRAMUSCULAR | Status: AC | PRN
  Administered 2022-03-10: 100 mL via INTRAVENOUS

## 2022-03-10 MED ORDER — POLYETHYLENE GLYCOL 3350 17 G PO PACK: 17.0000 g | PACK | Freq: Every day | ORAL | Status: DC | PRN

## 2022-03-10 MED ORDER — PILOCARPINE HCL 5 MG PO TABS
5.0000 mg | ORAL_TABLET | Freq: Two times a day (BID) | ORAL | Status: DC
  Administered 2022-03-10 – 2022-03-12 (×3): 5 mg via ORAL
  Filled 2022-03-10 (×8): qty 1
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
## 2022-03-10 NOTE — ED Notes (Signed)
CCM at the bedside ?

## 2022-03-10 NOTE — Consult Note (Signed)
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 unlabored. Heart: RRR with S1 S2. No murmurs, rubs, or gallops. Abdomen: Soft, non-distended with normoactive bowel sounds. Diffuse tenderness to palpation but R>L Lower extremities:without edema or ischemic changes, no open wounds  Neuro: Alert and oriented X 3. Moves all extremities spontaneously. Psych:  Responds to questions appropriately with a normal affect. Dialysis Access: R AVF +thrill  Dialysis Orders: [facility] T,Th,S 4 hrs 180NRe 400/Autoflow 1.5 96.9 2.0 K/2.0 Ca UF Profile 2 AVF -No heparin  -Venofer  50 mg IV weekly  -Calcitriol  1.75 mcg PO TIW No ESA ordered as OP  Assessment/Plan: Sepsis due to ascending colitis - IV Zosyn , Vanc. Confirmed by CT. GI considering colonoscopy and/or mesenteric angio. Per GI/primary.  Hypotension - chronic, likely worsened in the setting of sepsis and missed midodrine  dose. Likely worsening colitis with bowel ischemia component. On Midodrine  T-T-S 5mg  TID; will increase to 10mg  and schedule TID qd for now.  Mild hyperkalemia: Lokelma  10 grams PO X 1 dose.  ESRD on HD TTS -  Dialyzed yesterday. May require CRRT instead of HD  tomorrow but will hold off on this decision for now. Hoping shock will resolve and BPs will be adequate for HD tomorrow.   Volume  - Euvolemic on exam. UF as tolerated. Use UF profile 2.  Anemia  - Hgb 10.5 on 3/29. Not currently on ESA. Metabolic bone disease - CCa WNL. On binders, VRDA. Nutrition - Albumin  2.8 on 3/29. Add protein supplementation when eating solid food.   Josh Penninger, PA-S2  Seen with PA-S, as above. Agree with assessment/plan. Charlette Hennings H. Delores, NP-C 03/10/2022, 1:49 PM  [employer] [phone]
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
## 2022-03-10 NOTE — Progress Notes (Addendum)
Pharmacy Antibiotic Note ? ?[name] is a 70 y.o. female admitted on 03/09/2022 with  abdominal pain/fever .  Pharmacy has been consulted for Vancomycin and Zosyn dosing. WBC elevated. ESRD on HD TTS. ? ?Plan: ?Vancomycin 2000 mg IV x 1, then 1000 mg IV qHD TTS ?Vancomycin levels as needed ?Zosyn 2.25g IV q8h ?Trend WBC, temp, HD schedule ?F/U infectious work-up ? ?  ? ?Temp (24hrs), Avg:100.6 ?F (38.1 ?C), Min:100.6 ?F (38.1 ?C), Max:100.6 ?F (38.1 ?C) ? ?Recent Labs  ?Lab 03/09/22 ?2337  ?WBC 17.3*  ?CREATININE 3.89*  ?LATICACIDVEN 2.8*  ?  ?CrCl cannot be calculated (Unknown ideal weight.).   ? ?Allergies  ?Allergen Reactions  ? Erythromycin Diarrhea and Nausea And Vomiting  ? Gabapentin Other (See Comments)  ?  Confusion and falling   ? ? ?Narda Bonds, PharmD, BCPS ?Clinical Pharmacist ?Phone: [phone] ? ? ?
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
## 2022-03-10 NOTE — Sepsis Progress Note (Signed)
Full amount of fluids not given per sepsis protocol d/t fluids being restricted . She is dialysis patient. ?

## 2022-03-10 NOTE — Assessment & Plan Note (Signed)
?   Holding all usual home oral antihypertensives for now due to waxing and waning blood pressures in the emergency department ?? Only providing patient with as needed intravenous antihypertensives for markedly elevated pressure ?? Once patient is clinically improved we will consider resuming home antihypertensive therapy ? ? ?

## 2022-03-10 NOTE — Assessment & Plan Note (Signed)
?   Lactic acidosis likely secondary to sepsis with concurrent volume depletion ?? Hydrate patient intravenous isotonic fluids while concurrently treating underlying infection ?? Monitoring serial lactic acid levels to ensure downtrending and resolution. ? ?

## 2022-03-10 NOTE — Assessment & Plan Note (Addendum)
?   Patient presenting with 1 day history of generalized weakness malaise chills abdominal pain ?? On evaluation patient found to have multiple SIRS criteria including substantial leukocytosis tachycardia and fever all in the setting of suspected severe colitis suggestive of sepsis with concurrent lactic acidosis ?? Treating with intravenous Zosyn for broad-spectrum activity therapy with targeting suspected gram-negative and anaerobic organisms ?? Due to known history of end-stage renal disease and anuria patient is being judiciously provided with measured boluses of isotonic fluids with frequent reassessments ?? Blood cultures obtained, antibiotic therapy will be deescalated based on these results ?? Close clinical monitoring as patient is at high risk of rapid clinical decompensation ?? Due to ongoing  Hypotension will consult PCCM , their assistance is appreciated. ?? Will also consult [facility], their advisement is also appreciated.  ? ?
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
## 2022-03-10 NOTE — Assessment & Plan Note (Signed)
Continuing home regimen of daily PPI therapy.  

## 2022-03-10 NOTE — Assessment & Plan Note (Signed)
?   Patient typically receives hemodialysis on Tuesdays, Thursdays and Saturdays ?? Consulting nephrology in the morning for resumption of hemodialysis while hospitalized ?? Monitoring renal function and electrolytes with serial chemistries ?? Fluid restricted renal diet ? ?

## 2022-03-10 NOTE — Sepsis Progress Note (Signed)
Sepsis protocol is being followed by [facility]. 

## 2022-03-10 NOTE — Hospital Course (Addendum)
Impression:               - Preparation of the colon was inadequate even  ?                          after copious lavage. ?                          - Hemorrhoids found on digital rectal exam. ?                          - Diffuse severe mucosal changes were found at the  ?                          hepatic flexure, in the ascending colon and in the  ?                          cecum secondary to most likely ischemic colitis.  ?                          This is an abnormal pattern for ischemia usually  ?                          however. Biopsied. ?                          - Diverticulosis in the recto-sigmoid colon, in the  ?                          sigmoid colon and in the descending colon. ?                          - Normal mucosa in the rectum, in the recto-sigmoid  ?                          colon, in the sigmoid colon, in the descending  ?                          colon and in the transverse colon. Biopsied with  ?                          history of microscopic colitis. ?                          - Non-bleeding non-thrombosed external and internal  ?                          hemorrhoids. ?Recommendation:           - The patient will be observed post-procedure,  ?                          until all discharge criteria are met. ?                          - Return patient to  hospital ward for ongoing care. ?                          - Continue present medications. ?                          - Await pathology results. ?                          - This colonoscopy cannot count as a  ?                          screening/surveillance as a result of the  ?                          inadequate preparation. Polyps or lesions could  ?                          have been missed. ?                          - Would wait for at least 6-8 weeks before  ?                          considering repeat colonoscopy but this should also  ?                          be based on patient's overall medical  ?                           status/comobidities - defer ultimately to primary  ?                          GI. ?                          - The findings and recommendations were discussed  ?                          with the patient. ?                          - The findings and recommendations were discussed  ?                          with the referring physician. ?
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
## 2022-03-10 NOTE — Assessment & Plan Note (Signed)
·   Please see assessment and plan above °
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
## 2022-03-11 ENCOUNTER — Telehealth: Payer: Self-pay | Admitting: Internal Medicine

## 2022-03-11 ENCOUNTER — Other Ambulatory Visit (HOSPITAL_COMMUNITY): Payer: Self-pay

## 2022-03-11 ENCOUNTER — Inpatient Hospital Stay (HOSPITAL_COMMUNITY): Payer: Medicare Other | Admitting: Certified Registered Nurse Anesthetist

## 2022-03-11 ENCOUNTER — Encounter (HOSPITAL_COMMUNITY): Payer: Self-pay | Admitting: Internal Medicine

## 2022-03-11 ENCOUNTER — Encounter (HOSPITAL_COMMUNITY)
Admission: EM | Disposition: A | Discharge status: Home / Self Care | Payer: Self-pay | Source: Home / Self Care | Attending: Family Medicine

## 2022-03-11 DIAGNOSIS — A4150 Gram-negative sepsis, unspecified: Secondary | ICD-10-CM | POA: Diagnosis not present

## 2022-03-11 DIAGNOSIS — K641 Second degree hemorrhoids: Secondary | ICD-10-CM

## 2022-03-11 DIAGNOSIS — Z992 Dependence on renal dialysis: Secondary | ICD-10-CM

## 2022-03-11 DIAGNOSIS — K5731 Diverticulosis of large intestine without perforation or abscess with bleeding: Secondary | ICD-10-CM

## 2022-03-11 DIAGNOSIS — N186 End stage renal disease: Secondary | ICD-10-CM

## 2022-03-11 DIAGNOSIS — K559 Vascular disorder of intestine, unspecified: Principal | ICD-10-CM

## 2022-03-11 DIAGNOSIS — E1122 Type 2 diabetes mellitus with diabetic chronic kidney disease: Secondary | ICD-10-CM

## 2022-03-11 DIAGNOSIS — I120 Hypertensive chronic kidney disease with stage 5 chronic kidney disease or end stage renal disease: Secondary | ICD-10-CM

## 2022-03-11 HISTORY — PX: BIOPSY: SHX5522

## 2022-03-11 HISTORY — PX: COLONOSCOPY WITH PROPOFOL: SHX5780

## 2022-03-11 LAB — GASTROINTESTINAL PANEL BY PCR, STOOL (REPLACES STOOL CULTURE)

## 2022-03-11 LAB — CBC
HCT: 28.5 % — ABNORMAL LOW (ref 36.0–46.0)
Hemoglobin: 9.4 g/dL — ABNORMAL LOW (ref 12.0–15.0)
MCH: 32 pg (ref 26.0–34.0)
MCHC: 33 g/dL (ref 30.0–36.0)
MCV: 96.9 fL (ref 80.0–100.0)
Platelets: 159 10*3/uL (ref 150–400)
RBC: 2.94 MIL/uL — ABNORMAL LOW (ref 3.87–5.11)
RDW: 13.6 % (ref 11.5–15.5)
WBC: 17.3 10*3/uL — ABNORMAL HIGH (ref 4.0–10.5)
nRBC: 0 % (ref 0.0–0.2)

## 2022-03-11 LAB — RENAL FUNCTION PANEL
Albumin: 2.6 g/dL — ABNORMAL LOW (ref 3.5–5.0)
Anion gap: 18 — ABNORMAL HIGH (ref 5–15)
BUN: 54 mg/dL — ABNORMAL HIGH (ref 8–23)
CO2: 21 mmol/L — ABNORMAL LOW (ref 22–32)
Calcium: 9 mg/dL (ref 8.9–10.3)
Chloride: 97 mmol/L — ABNORMAL LOW (ref 98–111)
Creatinine, Ser: 4.94 mg/dL — ABNORMAL HIGH (ref 0.44–1.00)
GFR, Estimated: 9 mL/min — ABNORMAL LOW (ref >60)
Glucose, Bld: 154 mg/dL — ABNORMAL HIGH (ref 70–99)
Phosphorus: 5.1 mg/dL — ABNORMAL HIGH (ref 2.5–4.6)
Potassium: 3.9 mmol/L (ref 3.5–5.1)
Sodium: 136 mmol/L (ref 135–145)

## 2022-03-11 LAB — GLUCOSE, CAPILLARY
Glucose-Capillary: 144 mg/dL — ABNORMAL HIGH (ref 70–99)
Glucose-Capillary: 93 mg/dL (ref 70–99)
Glucose-Capillary: 92 mg/dL (ref 70–99)
Glucose-Capillary: 195 mg/dL — ABNORMAL HIGH (ref 70–99)

## 2022-03-11 LAB — HEPATITIS B SURFACE ANTIGEN
Hepatitis B Surface Ag: NONREACTIVE
Hepatitis B Surface Ag: NONREACTIVE

## 2022-03-11 LAB — C DIFFICILE QUICK SCREEN W PCR REFLEX
C Diff antigen: NEGATIVE
C Diff interpretation: NOT DETECTED
C Diff toxin: NEGATIVE

## 2022-03-11 LAB — HEPATITIS B SURFACE ANTIBODY,QUALITATIVE: Hep B S Ab: REACTIVE — AB

## 2022-03-11 LAB — HEPATITIS B CORE ANTIBODY, TOTAL: Hep B Core Total Ab: NONREACTIVE

## 2022-03-11 SURGERY — COLONOSCOPY WITH PROPOFOL
Anesthesia: Monitor Anesthesia Care

## 2022-03-11 MED ORDER — SODIUM CHLORIDE 0.9 % IV SOLN: INTRAVENOUS | Status: DC | PRN

## 2022-03-11 MED ORDER — LIDOCAINE-PRILOCAINE 2.5-2.5 % EX CREA: 1.0000 "application " | TOPICAL_CREAM | CUTANEOUS | Status: DC | PRN

## 2022-03-11 MED ORDER — ALTEPLASE 2 MG IJ SOLR: 2.0000 mg | Freq: Once | INTRAMUSCULAR | Status: DC | PRN

## 2022-03-11 MED ORDER — PENTAFLUOROPROP-TETRAFLUOROETH EX AERO: 1.0000 "application " | INHALATION_SPRAY | CUTANEOUS | Status: DC | PRN

## 2022-03-11 MED ORDER — SODIUM CHLORIDE 0.9 % IV SOLN: 100.0000 mL | INTRAVENOUS | Status: DC | PRN

## 2022-03-11 MED ORDER — PROPOFOL 10 MG/ML IV BOLUS
INTRAVENOUS | Status: DC | PRN
  Administered 2022-03-11: 20 mg via INTRAVENOUS

## 2022-03-11 MED ORDER — LIDOCAINE 2% (20 MG/ML) 5 ML SYRINGE
INTRAMUSCULAR | Status: DC | PRN
  Administered 2022-03-11: 40 mg via INTRAVENOUS

## 2022-03-11 MED ORDER — PHENYLEPHRINE HCL-NACL 20-0.9 MG/250ML-% IV SOLN
INTRAVENOUS | Status: DC | PRN
  Administered 2022-03-11: 20 ug/min via INTRAVENOUS

## 2022-03-11 MED ORDER — LIDOCAINE HCL (PF) 1 % IJ SOLN: 5.0000 mL | INTRAMUSCULAR | Status: DC | PRN

## 2022-03-11 MED ORDER — HEPARIN SODIUM (PORCINE) 1000 UNIT/ML DIALYSIS: 1000.0000 [IU] | INTRAMUSCULAR | Status: DC | PRN

## 2022-03-11 MED ORDER — PROPOFOL 500 MG/50ML IV EMUL
INTRAVENOUS | Status: DC | PRN
  Administered 2022-03-11: 125 ug/kg/min via INTRAVENOUS

## 2022-03-11 SURGICAL SUPPLY — 22 items

## 2022-03-11 NOTE — Procedures (Signed)
I was present at this dialysis session. I have reviewed the session itself and made appropriate changes.  ? ?K 3.9 on 2 K [place], will move to 3K.  Hb 9.4 this AM.  UF goal 2L see how BP does. Right now says abd pain is stable to improved compared ot last night, SBP in 90s  ? ?CSY planned for later today.   ? Danley Danker Weights  ? 03/11/22 0732  ?Weight: 101.3 kg  ? ? ?Recent Labs  ?Lab 03/11/22 ?0804  ?NA 136  ?K 3.9  ?CL 97*  ?CO2 21*  ?GLUCOSE 154*  ?BUN 54*  ?CREATININE 4.94*  ?CALCIUM 9.0  ?PHOS 5.1*  ? ? ?Recent Labs  ?Lab 03/09/22 ?2337 03/10/22 ?7371 03/11/22 ?0802  ?WBC 17.3* 21.2* 17.3*  ?NEUTROABS 15.5* 18.0*  --   ?HGB 12.2 10.5* 9.4*  ?HCT 38.1 32.6* 28.5*  ?MCV 99.5 100.0 96.9  ?PLT 186 163 159  ? ? ?Scheduled Meds: ? buPROPion  300 mg Oral Daily  ? Chlorhexidine Gluconate Cloth  6 each Topical Q0600  ? FLUoxetine  20 mg Oral q morning  ? heparin  5,000 Units Subcutaneous Q8H  ? insulin aspart  0-6 Units Subcutaneous TID WC  ? midodrine  10 mg Oral Q T,Th,Sa-HD  ? multivitamin  1 tablet Oral QHS  ? pantoprazole  40 mg Oral Daily  ? pilocarpine  5 mg Oral BID  ? sevelamer carbonate  1,600 mg Oral TID WC  ? ?Continuous Infusions: ? sodium chloride 50 mL/hr at 03/10/22 2323  ? sodium chloride    ? sodium chloride    ? piperacillin-tazobactam (ZOSYN)  IV 2.25 g (03/11/22 0534)  ? vancomycin    ? ?PRN Meds:.sodium chloride, sodium chloride, acetaminophen **OR** acetaminophen, alteplase, cyclobenzaprine, heparin, lidocaine (PF), lidocaine-prilocaine, LORazepam, morphine injection, ondansetron **OR** ondansetron (ZOFRAN) IV, oxyCODONE, pentafluoroprop-tetrafluoroeth, polyethylene glycol, polyvinyl alcohol, sevelamer carbonate   ?[name]  MD ?03/11/2022, 9:22 AM ?  ?

## 2022-03-11 NOTE — Anesthesia Preprocedure Evaluation (Signed)
Anesthesia Evaluation  ?Patient identified by MRN, date of birth, ID band ?Patient awake ? ? ? ?Reviewed: ?Allergy & Precautions, NPO status , Patient's Chart, lab work & pertinent test results ? ?Airway ?Mallampati: II ? ?TM Distance: >3 FB ?Neck ROM: Full ? ? ? Dental ? ?(+) Dental Advisory Given, Teeth Intact ?  ?Pulmonary ?shortness of breath, pneumonia, former smoker,  ?  ?Pulmonary exam normal ?breath sounds clear to auscultation ? ? ? ? ? ? Cardiovascular ?hypertension,  ?Rhythm:Regular Rate:Normal ? ? ?  ?Neuro/Psych ? Headaches, PSYCHIATRIC DISORDERS Anxiety Depression   ? GI/Hepatic ?Neg liver ROS, GERD  Medicated,  ?Endo/Other  ?diabetes, Type 2, Oral Hypoglycemic AgentsMorbid obesity ? Renal/GU ?ESRF and DialysisRenal disease  ?negative genitourinary ?  ?Musculoskeletal ? ?(+) Arthritis , Osteoarthritis,  Fibromyalgia - ? Abdominal ?(+) + obese,   ?Peds ? Hematology ? ?(+) Blood dyscrasia, anemia ,   ?Anesthesia Other Findings ? ? Reproductive/Obstetrics ? ?  ? ? ? ? ? ? ? ? ? ? ? ? ? ?  ?  ? ? ? ? ? ? ? ? ?Anesthesia Physical ? ?Anesthesia Plan ? ?ASA: III ? ?Anesthesia Plan: MAC  ? ?Post-op Pain Management: Minimal or no pain anticipated  ? ?Induction: Intravenous ? ?PONV Risk Score and Plan: 2 and TIVA and Propofol infusion ? ?Airway Management Planned: Natural Airway and Mask ? ?Additional Equipment: None ? ?Intra-op Plan:  ? ?Post-operative Plan:  ? ?Informed Consent: I have reviewed the patients History and Physical, chart, labs and discussed the procedure including the risks, benefits and alternatives for the proposed anesthesia with the patient or authorized representative who has indicated his/her understanding and acceptance.  ? ? ? ?Dental advisory given ? ?Plan Discussed with: CRNA ? ?Anesthesia Plan Comments:   ? ? ? ? ? ? ?Anesthesia Quick Evaluation ? ?

## 2022-03-11 NOTE — Transfer of Care (Signed)
Immediate Anesthesia Transfer of Care Note ? ?Patient: [name] ? ?Procedure(s) Performed: COLONOSCOPY WITH PROPOFOL ?BIOPSY ? ?Patient Location: Endoscopy Unit ? ?Anesthesia Type:MAC ? ?Level of Consciousness: drowsy ? ?Airway & Oxygen Therapy: Patient Spontanous Breathing and Patient connected to face mask oxygen ? ?Post-op Assessment: Report given to RN and Post -op Vital signs reviewed and stable ? ?Post vital signs: Reviewed and stable ? ?Last Vitals:  ?Vitals Value Taken Time  ?BP 93/44 03/11/22 1455  ?Temp    ?Pulse 77 03/11/22 1456  ?Resp 17 03/11/22 1456  ?SpO2 100 % 03/11/22 1456  ?Vitals shown include unvalidated device data. ? ?Last Pain:  ?Vitals:  ? 03/11/22 1349  ?TempSrc: Temporal  ?PainSc: 0-No pain  ?   ? ?  ? ?Complications: No notable events documented. ?

## 2022-03-11 NOTE — Op Note (Signed)
[facility] ?Patient Name: [name] ?Procedure Date : 03/11/2022 ?MRN: [id number] ?Attending MD: Justice Britain , MD ?Date of Birth: 05/31/1951 ?CSN: [id number] ?Age: 71 ?Admit Type: Inpatient ?Procedure:                Colonoscopy ?Indications:              Generalized abdominal pain, Rectal bleeding,  ?                          Abnormal CT of the GI tract ?Providers:                Justice Britain, MD, Burtis Junes, RN, Narda Rutherford  ?                          [place], Technician, Trixie Deis, CRNA ?Referring MD:             Docia Chuck. Henrene Pastor, MD, [facility] ?Medicines:                Monitored Anesthesia Care ?Complications:            No immediate complications. ?Estimated Blood Loss:     Estimated blood loss was minimal. ?Procedure:                Pre-Anesthesia Assessment: ?                          - Prior to the procedure, a History and Physical  ?                          was performed, and patient medications and  ?                          allergies were reviewed. The patient's tolerance of  ?                          previous anesthesia was also reviewed. The risks  ?                          and benefits of the procedure and the sedation  ?                          options and risks were discussed with the patient.  ?                          All questions were answered, and informed consent  ?                          was obtained. Prior Anticoagulants: The patient has  ?                          taken no previous anticoagulant or antiplatelet  ?                          agents. ASA Grade Assessment: III - A patient with  ?  severe systemic disease. After reviewing the risks  ?                          and benefits, the patient was deemed in  ?                          satisfactory condition to undergo the procedure. ?                          After obtaining informed consent, the colonoscope  ?                          was passed under direct vision. Throughout  the  ?                          procedure, the patient's blood pressure, pulse, and  ?                          oxygen saturations were monitored continuously. The  ?                          CF-HQ190L ([id number]) Olympus colonoscope was  ?                          introduced through the anus and advanced to the the  ?                          cecum, identified by appendiceal orifice and  ?                          ileocecal valve. The colonoscopy was performed  ?                          without difficulty. The patient tolerated the  ?                          procedure. The quality of the bowel preparation was  ?                          inadequate. The ileocecal valve, appendiceal  ?                          orifice, and rectum were photographed. ?Scope In: 2:27:14 PM ?Scope Out: 2:45:39 PM ?Scope Withdrawal Time: 0 hours 9 minutes 40 seconds  ?Total Procedure Duration: 0 hours 18 minutes 25 seconds  ?Findings: ?     The digital rectal exam findings include hemorrhoids. Pertinent  ?     negatives include no palpable rectal lesions. ?     Extensive amounts of semi-liquid stool was found in the entire colon,  ?     interfering with visualization. Lavage of the area was performed using  ?     copious amounts, resulting in incomplete clearance with continued poor  ?     visualization. ?     Diffuse severe mucosal changes characterized by altered vascularity,  ?  congestion (edema), friability, granularity, loss of vascularity and  ?     serpentine ulceration were found at the hepatic flexure continuously to  ?     the cecum. Biopsies were taken with a cold forceps for histology -  ?     though this is most consistent with ischemic colitis picture. ?     Multiple small-mouthed diverticula were found in the recto-sigmoid  ?     colon, sigmoid colon and descending colon. ?     Otherwise normal mucosa was found in the rectum, in the recto-sigmoid  ?     colon, in the sigmoid colon, in the descending colon and in the  ?      transverse colon. Biopsies for histology were taken with a cold forceps  ?     for evaluation of microscopic colitis. ?     Non-bleeding non-thrombosed external and internal hemorrhoids were found  ?     during retroflexion, during perianal exam and during digital exam. The  ?     hemorrhoids were Grade II (internal hemorrhoids that prolapse but reduce  ?     spontaneously). ?Impression:               - Preparation of the colon was inadequate even  ?                          after copious lavage. ?                          - Hemorrhoids found on digital rectal exam. ?                          - Diffuse severe mucosal changes were found at the  ?                          hepatic flexure, in the ascending colon and in the  ?                          cecum secondary to most likely ischemic colitis.  ?                          This is an abnormal pattern for ischemia usually  ?                          however. Biopsied. ?                          - Diverticulosis in the recto-sigmoid colon, in the  ?                          sigmoid colon and in the descending colon. ?                          - Normal mucosa in the rectum, in the recto-sigmoid  ?                          colon, in the sigmoid colon, in the descending  ?  colon and in the transverse colon. Biopsied with  ?                          history of microscopic colitis. ?                          - Non-bleeding non-thrombosed external and internal  ?                          hemorrhoids. ?Recommendation:           - The patient will be observed post-procedure,  ?                          until all discharge criteria are met. ?                          - Return patient to hospital ward for ongoing care. ?                          - Continue present medications. ?                          - Await pathology results. ?                          - This colonoscopy cannot count as a  ?                          screening/surveillance as a  result of the  ?                          inadequate preparation. Polyps or lesions could  ?                          have been missed. ?                          - Would wait for at least 6-8 weeks before  ?                          considering repeat colonoscopy but this should also  ?                          be based on patient's overall medical  ?                          status/comobidities - defer ultimately to primary  ?                          GI. ?                          - The findings and recommendations were discussed  ?                          with the  patient. ?                          - The findings and recommendations were discussed  ?                          with the referring physician. ?Procedure Code(s):        --- Professional --- ?                          [phone], Colonoscopy, flexible; with biopsy, single  ?                          or multiple ?Diagnosis Code(s):        --- Professional --- ?                          K64.1, Second degree hemorrhoids ?                          K55.9, Vascular disorder of intestine, unspecified ?                          R10.84, Generalized abdominal pain ?                          K62.5, Hemorrhage of anus and rectum ?                          K57.30, Diverticulosis of large intestine without  ?                          perforation or abscess without bleeding ?                          R93.3, Abnormal findings on diagnostic imaging of  ?                          other parts of digestive tract ?CPT copyright 2019 [employer] Medical Association. All rights reserved. ?The codes documented in this report are preliminary and upon coder review may  ?be revised to meet current compliance requirements. ?Justice Britain, MD ?03/11/2022 3:01:44 PM ?Number of Addenda: 0 ?

## 2022-03-11 NOTE — Telephone Encounter (Signed)
Connected to [facility] 3.28.2023. ? ?Caller states she is experiencing abdominal pain after dialysis today and a temp of 99.1 oral. Caller notes she is having frequent bowel movements. ? ?Advised to see HCP within 4 hours.  ?

## 2022-03-11 NOTE — Interval H&P Note (Signed)
History and Physical Interval Note: ? ?03/11/2022 ?1:43 PM ? ?[name]  has presented today for surgery, with the diagnosis of Right-sided colitis on CT.  Abdominal pain.  Hgb 10.5.  ESRD.  The various methods of treatment have been discussed with the patient and family. After consideration of risks, benefits and other options for treatment, the patient has consented to  Procedure(s): ?COLONOSCOPY WITH PROPOFOL (N/A) as a surgical intervention.  The patient's history has been reviewed, patient examined, no change in status, stable for surgery.  I have reviewed the patient's chart and labs.  Questions were answered to the patient's satisfaction.   ? ? ?Irving Copas ? ? ?
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
## 2022-03-11 NOTE — Anesthesia Postprocedure Evaluation (Signed)
Anesthesia Post Note ? ?Patient: [name] ? ?Procedure(s) Performed: COLONOSCOPY WITH PROPOFOL ?BIOPSY ? ?  ? ?Patient location during evaluation: Phase II ?Anesthesia Type: MAC ?Level of consciousness: awake ?Pain management: pain level controlled ?Vital Signs Assessment: post-procedure vital signs reviewed and stable ?Respiratory status: spontaneous breathing ?Cardiovascular status: stable ?Postop Assessment: no apparent nausea or vomiting ?Anesthetic complications: no ? ? ?No notable events documented. ? ?Last Vitals:  ?Vitals:  ? 03/11/22 1525 03/11/22 1530  ?BP: (!) 108/47   ?Pulse: (!) 109 83  ?Resp: 14 12  ?Temp:    ?SpO2: 97% 98%  ?  ?Last Pain:  ?Vitals:  ? 03/11/22 1514  ?TempSrc:   ?PainSc: 0-No pain  ? ? ?  ?  ?  ?  ?  ?  ? ?Huston Foley ? ? ? ? ?

## 2022-03-12 ENCOUNTER — Other Ambulatory Visit: Payer: Self-pay

## 2022-03-12 ENCOUNTER — Telehealth: Payer: Self-pay

## 2022-03-12 ENCOUNTER — Encounter (HOSPITAL_COMMUNITY): Payer: Self-pay | Admitting: Gastroenterology

## 2022-03-12 ENCOUNTER — Other Ambulatory Visit (HOSPITAL_COMMUNITY): Payer: Self-pay

## 2022-03-12 DIAGNOSIS — Z992 Dependence on renal dialysis: Secondary | ICD-10-CM | POA: Diagnosis not present

## 2022-03-12 DIAGNOSIS — E1022 Type 1 diabetes mellitus with diabetic chronic kidney disease: Secondary | ICD-10-CM | POA: Diagnosis not present

## 2022-03-12 DIAGNOSIS — N186 End stage renal disease: Secondary | ICD-10-CM | POA: Diagnosis not present

## 2022-03-12 DIAGNOSIS — A4150 Gram-negative sepsis, unspecified: Secondary | ICD-10-CM | POA: Diagnosis not present

## 2022-03-12 LAB — HEPATITIS B SURFACE ANTIBODY, QUANTITATIVE
Hep B S AB Quant (Post): 20.7 m[IU]/mL (ref >9.9)
Hep B S AB Quant (Post): 24 m[IU]/mL (ref >9.9)

## 2022-03-12 LAB — CBC WITH DIFFERENTIAL/PLATELET
Abs Immature Granulocytes: 0.05 10*3/uL (ref 0.00–0.07)
Basophils Absolute: 0.1 10*3/uL (ref 0.0–0.1)
Basophils Relative: 0 %
Eosinophils Absolute: 0.3 10*3/uL (ref 0.0–0.5)
Eosinophils Relative: 2 %
HCT: 30.2 % — ABNORMAL LOW (ref 36.0–46.0)
Hemoglobin: 10 g/dL — ABNORMAL LOW (ref 12.0–15.0)
Immature Granulocytes: 0 %
Lymphocytes Relative: 20 %
Lymphs Abs: 2.9 10*3/uL (ref 0.7–4.0)
MCH: 32.1 pg (ref 26.0–34.0)
MCHC: 33.1 g/dL (ref 30.0–36.0)
MCV: 96.8 fL (ref 80.0–100.0)
Monocytes Absolute: 0.8 10*3/uL (ref 0.1–1.0)
Monocytes Relative: 5 %
Neutro Abs: 10.3 10*3/uL — ABNORMAL HIGH (ref 1.7–7.7)
Neutrophils Relative %: 73 %
Platelets: 170 10*3/uL (ref 150–400)
RBC: 3.12 MIL/uL — ABNORMAL LOW (ref 3.87–5.11)
RDW: 13.4 % (ref 11.5–15.5)
WBC: 14.4 10*3/uL — ABNORMAL HIGH (ref 4.0–10.5)
nRBC: 0 % (ref 0.0–0.2)

## 2022-03-12 LAB — RENAL FUNCTION PANEL
Albumin: 2.7 g/dL — ABNORMAL LOW (ref 3.5–5.0)
Anion gap: 12 (ref 5–15)
BUN: 21 mg/dL (ref 8–23)
CO2: 27 mmol/L (ref 22–32)
Calcium: 9.3 mg/dL (ref 8.9–10.3)
Chloride: 94 mmol/L — ABNORMAL LOW (ref 98–111)
Creatinine, Ser: 2.99 mg/dL — ABNORMAL HIGH (ref 0.44–1.00)
GFR, Estimated: 16 mL/min — ABNORMAL LOW (ref >60)
Glucose, Bld: 116 mg/dL — ABNORMAL HIGH (ref 70–99)
Phosphorus: 3.3 mg/dL (ref 2.5–4.6)
Potassium: 3.1 mmol/L — ABNORMAL LOW (ref 3.5–5.1)
Sodium: 133 mmol/L — ABNORMAL LOW (ref 135–145)

## 2022-03-12 LAB — GLUCOSE, CAPILLARY
Glucose-Capillary: 124 mg/dL — ABNORMAL HIGH (ref 70–99)
Glucose-Capillary: 157 mg/dL — ABNORMAL HIGH (ref 70–99)

## 2022-03-12 MED ORDER — ACCU-CHEK SOFTCLIX LANCETS MISC
1.0000 | Freq: Four times a day (QID) | 0 refills | Status: AC
  Filled 2022-03-12: qty 100, 25d supply, fill #0

## 2022-03-12 MED ORDER — POTASSIUM CHLORIDE CRYS ER 20 MEQ PO TBCR
20.0000 meq | EXTENDED_RELEASE_TABLET | Freq: Once | ORAL | Status: AC
  Administered 2022-03-12: 20 meq via ORAL
  Filled 2022-03-12: qty 1

## 2022-03-12 MED ORDER — ACCU-CHEK GUIDE ME W/DEVICE KIT
1.0000 | PACK | Freq: Four times a day (QID) | 0 refills | Status: DC
  Filled 2022-03-12: qty 1, fill #0
  Filled 2022-03-12: qty 1, 1d supply, fill #0

## 2022-03-12 MED ORDER — ACCU-CHEK GUIDE VI STRP
1.0000 | ORAL_STRIP | Freq: Four times a day (QID) | 0 refills | Status: DC
  Filled 2022-03-12: qty 50, 13d supply, fill #0

## 2022-03-12 MED ORDER — AMOXICILLIN-POT CLAVULANATE 875-125 MG PO TABS
1.0000 | ORAL_TABLET | Freq: Two times a day (BID) | ORAL | 0 refills | Status: DC
  Filled 2022-03-12: qty 10, 5d supply, fill #0

## 2022-03-12 MED ORDER — AMOXICILLIN-POT CLAVULANATE 875-125 MG PO TABS
1.0000 | ORAL_TABLET | Freq: Two times a day (BID) | ORAL | 0 refills | Status: DC
  Filled 2022-03-12: qty 28, 14d supply, fill #0

## 2022-03-12 NOTE — Progress Notes (Addendum)
Pharmacy Antibiotic Note ? ?EVADENE WARDRIP is a 71 y.o. female admitted on 03/09/2022 with  abdominal pain/fever .  Pharmacy has been consulted for Zosyn dosing.  ? ?ESRD on HD TTS - last 3/30.  ?WBC and fever curve are trending down.  ?Cultures remain negative to date.  ?Vancomycin was discontinued 3/30.  ? ?Plan: ?Continue Zosyn 2.25g IV q8h  for HD.  ?No further adjustment needed.  ?Pharmacy will sign off consult and monitor peripherally as needed.  ? ?Height: '5\' 2"'$  (157.5 cm) ?Weight: 93.9 kg (207 lb 0.2 oz) ?IBW/kg (Calculated) : 50.1 ? ?Temp (24hrs), Avg:98.1 ?F (36.7 ?C), Min:97.1 ?F (36.2 ?C), Max:98.8 ?F (37.1 ?C) ? ?Recent Labs  ?Lab 03/09/22 ?2337 03/10/22 ?2993 03/10/22 ?7169 03/11/22 ?6789 03/11/22 ?3810 03/12/22 ?0139  ?WBC 17.3*  --  21.2* 17.3*  --  14.4*  ?CREATININE 3.89*  --  4.12*  --  4.94* 2.99*  ?LATICACIDVEN 2.8* 2.4* 2.3*  --   --   --   ?  ?Estimated Creatinine Clearance: 18.7 mL/min (A) (by C-G formula based on SCr of 2.99 mg/dL (H)).   ? ?Allergies  ?Allergen Reactions  ? Erythromycin Diarrhea and Nausea And Vomiting  ? Gabapentin Other (See Comments)  ?  Confusion and falling   ? ? ?Sloan Leiter, PharmD, BCPS, BCCCP ?Clinical Pharmacist ?Please refer to [facility] for [place] numbers ?03/12/2022, 8:23 AM ? ? ? ?

## 2022-03-12 NOTE — Telephone Encounter (Signed)
The patient is currently hospitalized.  Thanks ?

## 2022-03-12 NOTE — Telephone Encounter (Signed)
Appointment 04/21/22 at 9:20 am with Dr Henrene Pastor. Will mail appointment information. ?

## 2022-03-12 NOTE — Telephone Encounter (Signed)
-----   Message from Alfredia Ferguson, PA-C sent at 03/12/2022 11:28 AM EDT ----- ?Regarding: Office follow-up ?[name], patient has been in the hospital with a right sided probable ischemic colitis, being discharged today. ?Please get her office follow-up with [name] in 6 weeks or so ?Okay to wait till Monday to call her with appointment-thank you ? ?

## 2022-03-12 NOTE — Progress Notes (Signed)
Clarified with Dr. Verlon Au that the antibiotic is for 5 days., pt informed. ?

## 2022-03-12 NOTE — Progress Notes (Addendum)
? [facility] ?Progress Note  ? ?Subjective:    ?Seen in room. Patient says she is back to her baseline and feeling well. Dialyzed  03/11/2022 and also underwent colonoscopy/  ? ?Objective ?Vitals:  ? 03/11/22 2321 03/12/22 0202 03/12/22 0531 03/12/22 0753  ?BP: 117/60  (!) 107/51 128/74  ?Pulse: 93  82 85  ?Resp: '17  17 18  '$ ?Temp: 98.2 ?F (36.8 ?C)  98.3 ?F (36.8 ?C) 98.8 ?F (37.1 ?C)  ?TempSrc: Oral  Oral Oral  ?SpO2: 95%  94% 95%  ?Weight:  93.9 kg    ?Height:      ? ?Physical Exam ?General: Elderly female in NAD ?Lungs: CTAB. Breathing is unlabored. ?Heart: RRR with S1 S2. No murmurs, rubs, or gallops. ?Abdomen: Soft, non-distended with normoactive bowel sounds.  ?Lower extremities:without edema or ischemic changes, no open wounds  ?Neuro: Alert and oriented X 3. Moves all extremities spontaneously. ?Psych:  Responds to questions appropriately with a normal affect. ?Dialysis Access: R AVF +thrill ? ? ?Additional Objective ?Labs: ?Basic Metabolic Panel: ?Recent Labs  ?Lab 03/10/22 ?0722 03/11/22 ?0804 03/12/22 ?0139  ?NA 133* 136 133*  ?K 5.4* 3.9 3.1*  ?CL 94* 97* 94*  ?CO2 27 21* 27  ?GLUCOSE 148* 154* 116*  ?BUN 37* 54* 21  ?CREATININE 4.12* 4.94* 2.99*  ?CALCIUM 8.9 9.0 9.3  ?PHOS  --  5.1* 3.3  ? ?Liver Function Tests: ?Recent Labs  ?Lab 03/09/22 ?2337 03/10/22 ?9163 03/11/22 ?0804 03/12/22 ?0139  ?AST 34 24  --   --   ?ALT 28 21  --   --   ?ALKPHOS 142* 107  --   --   ?BILITOT 0.7 0.9  --   --   ?PROT 7.3 5.8*  --   --   ?ALBUMIN 3.7 2.8* 2.6* 2.7*  ? ?Recent Labs  ?Lab 03/09/22 ?2337  ?LIPASE 25  ? ?CBC: ?Recent Labs  ?Lab 03/09/22 ?2337 03/10/22 ?8466 03/11/22 ?0802 03/12/22 ?0139  ?WBC 17.3* 21.2* 17.3* 14.4*  ?NEUTROABS 15.5* 18.0*  --  10.3*  ?HGB 12.2 10.5* 9.4* 10.0*  ?HCT 38.1 32.6* 28.5* 30.2*  ?MCV 99.5 100.0 96.9 96.8  ?PLT 186 163 159 170  ? ?Blood Culture ?   ?Component Value Date/Time  ? SDES BLOOD SITE NOT SPECIFIED 03/09/2022 2338  ? SDES BLOOD SITE NOT SPECIFIED 03/09/2022  2338  ? SPECREQUEST  03/09/2022 2338  ?  BOTTLES DRAWN AEROBIC AND ANAEROBIC Blood Culture adequate volume  ? SPECREQUEST AEROBIC BOTTLE ONLY Blood Culture adequate volume 03/09/2022 2338  ? CULT  03/09/2022 2338  ?  NO GROWTH 3 DAYS ?Performed at [facility], [place] [address], [place], [place] [address] ?  ? CULT  03/09/2022 2338  ?  NO GROWTH 3 DAYS ?Performed at [facility], [place] [address] [place], [place] [address] ?  ? REPTSTATUS PENDING 03/09/2022 2338  ? REPTSTATUS PENDING 03/09/2022 2338  ? ? ?Cardiac Enzymes: ?No results for input(s): CKTOTAL, CKMB, CKMBINDEX, TROPONINI in the last 168 hours. ?CBG: ?Recent Labs  ?Lab 03/11/22 ?0719 03/11/22 ?1324 03/11/22 ?1724 03/11/22 ?2025 03/12/22 ?0752  ?GLUCAP 144* 93 92 195* 124*  ? ?Iron Studies: No results for input(s): IRON, TIBC, TRANSFERRIN, FERRITIN in the last 72 hours. ?'@lablastinr3'$ @ ?Studies/Results: ?No results found. ?Medications: ? piperacillin-tazobactam (ZOSYN)  IV 2.25 g (03/12/22 0549)  ? ? buPROPion  300 mg Oral Daily  ? Chlorhexidine Gluconate Cloth  6 each Topical Q0600  ? FLUoxetine  20 mg Oral q morning  ?  heparin  5,000 Units Subcutaneous Q8H  ? insulin aspart  0-6 Units Subcutaneous TID WC  ? midodrine  10 mg Oral Q T,Th,Sa-HD  ? multivitamin  1 tablet Oral QHS  ? pantoprazole  40 mg Oral Daily  ? pilocarpine  5 mg Oral BID  ? sevelamer carbonate  1,600 mg Oral TID WC  ? ?Dialysis Orders: [facility] T,Th,S ?4 hrs 180NRe 400/Autoflow 1.5 96.9 2.0 K/2.0 Ca UF Profile 2 AVF ?-No heparin ?-Venofer 50 mg IV weekly  ?-Calcitriol 1.75 mcg PO TIW ?No ESA ordered as OP ? ?Assessment/Plan: ?Sepsis due to ischemic colitis- IV Zosyn, Vanc. Confirmed by CT. Underwent colonoscopy on 3/30. Per GI/primary.  ?Hypotension, improving - chronic, likely worsened in the setting of sepsis and missed midodrine dose. component. On Midodrine T-T-S '10mg'$  TID ?Mild hypokalemia: Currently 3.1 on 3/31. Was hyperkalemic on admission and received Lokelma 10 grams PO X 1  dose. Dialyzed yesterday with 2K bath. Will give 47mq PO K today. 4K bath tomorrow during HD. ?ESRD on HD TTS -  Dialyzed yesterday. Next HD 03/13/22 if still inpatient. ?Volume  - Euvolemic on exam. UF as tolerated. Use UF profile 2.  ?Anemia  - Hgb 10.0 on 3/31. Not currently on ESA. ?Metabolic bone disease - CCa WNL. On binders, VRDA. ?Nutrition - Albumin 2.7 on 3/31. Add protein supplementation when eating solid food.  ? ?JChemical engineer PA-S2 ? ?Seen with PA-S, as above. Agree with assessment/plan. ?Phelan Schadt H. Mallisa Alameda NP-C ?03/12/2022, 9:29 AM  ?[facility] ?[phone]? ? ?
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
## 2022-03-12 NOTE — Progress Notes (Signed)
Mobility Specialist Progress Note: ? ? 03/12/22 1138  ?Mobility  ?Activity Ambulated with assistance in hallway  ?Level of Assistance Independent  ?Assistive Device  ?(Walking stick)  ?Distance Ambulated (ft) 120 ft  ?Activity Response Tolerated well  ?$Mobility charge 1 Mobility  ? ?Pt received sitting on couch in room. Complaints of feet hurting d/t neuropathy and stomach pain. Left in room with MD present and all needs met.  ? ?Nelly Scriven ?Mobility Specialist ?Primary Phone [phone] ? ?
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
## 2022-03-12 NOTE — Progress Notes (Signed)
Pt ready for DC to home, called cardiac monitoring to DC tele. AVS given and reviewed with pt. Pt understands appointments for follow up and HD for tomorrow. ?
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
## 2022-03-12 NOTE — Care Management Important Message (Signed)
Important Message ? ?Patient Details  ?Name: [name] ?MRN: [id number] ?Date of Birth: July 10, 1951 ? ? ?Medicare Important Message Given:  Yes ? ? ? ? ?Levada Dy  Hadlie Gipson-Martin ?03/12/2022, 1:32 PM ?

## 2022-03-13 DIAGNOSIS — N186 End stage renal disease: Secondary | ICD-10-CM | POA: Diagnosis not present

## 2022-03-13 DIAGNOSIS — D689 Coagulation defect, unspecified: Secondary | ICD-10-CM | POA: Diagnosis not present

## 2022-03-13 DIAGNOSIS — N2581 Secondary hyperparathyroidism of renal origin: Secondary | ICD-10-CM | POA: Diagnosis not present

## 2022-03-13 DIAGNOSIS — D631 Anemia in chronic kidney disease: Secondary | ICD-10-CM | POA: Diagnosis not present

## 2022-03-13 DIAGNOSIS — E1129 Type 2 diabetes mellitus with other diabetic kidney complication: Secondary | ICD-10-CM | POA: Diagnosis not present

## 2022-03-13 DIAGNOSIS — Z992 Dependence on renal dialysis: Secondary | ICD-10-CM | POA: Diagnosis not present

## 2022-03-14 ENCOUNTER — Telehealth: Payer: Self-pay | Admitting: Physician Assistant

## 2022-03-14 LAB — CULTURE, BLOOD (ROUTINE X 2)
Culture: NO GROWTH
Culture: NO GROWTH
Special Requests: ADEQUATE
Special Requests: ADEQUATE

## 2022-03-14 NOTE — Telephone Encounter (Signed)
Transition of care contact from inpatient facility ? ?Date of discharge: 03/12/22 ?Date of contact: 03/14/22 ?Method: Phone ?Spoke to: Patient ? ?Patient contacted to discuss transition of care from recent inpatient hospitalization. Patient was admitted to [facility] from ... with discharge diagnosis of ischemic colitis ? ?Medication changes were reviewed. Confirmed patient has the correct dose of augmentin ('500mg'$ -'125mg'$ ) and she reports it was corrected at the pharmacy. Other medication changes reviewed.  ? ?Patient will follow up with his/her outpatient HD unit on: 03/16/22 ? ?Anice Paganini, PA-C ?03/14/2022, 11:43 AM  ?[facility] ?Pager: [phone] ? ? ? ?

## 2022-03-15 ENCOUNTER — Telehealth: Payer: Self-pay

## 2022-03-15 LAB — SURGICAL PATHOLOGY

## 2022-03-15 NOTE — Telephone Encounter (Signed)
Tcm not needed with pt being on dialysis ?

## 2022-03-16 DIAGNOSIS — N2581 Secondary hyperparathyroidism of renal origin: Secondary | ICD-10-CM | POA: Diagnosis not present

## 2022-03-16 DIAGNOSIS — Z992 Dependence on renal dialysis: Secondary | ICD-10-CM | POA: Diagnosis not present

## 2022-03-16 DIAGNOSIS — D689 Coagulation defect, unspecified: Secondary | ICD-10-CM | POA: Diagnosis not present

## 2022-03-16 DIAGNOSIS — E1129 Type 2 diabetes mellitus with other diabetic kidney complication: Secondary | ICD-10-CM | POA: Diagnosis not present

## 2022-03-16 DIAGNOSIS — N186 End stage renal disease: Secondary | ICD-10-CM | POA: Diagnosis not present

## 2022-03-16 DIAGNOSIS — D631 Anemia in chronic kidney disease: Secondary | ICD-10-CM | POA: Diagnosis not present

## 2022-03-17 ENCOUNTER — Encounter: Payer: Self-pay | Admitting: Gastroenterology

## 2022-03-18 DIAGNOSIS — E1129 Type 2 diabetes mellitus with other diabetic kidney complication: Secondary | ICD-10-CM | POA: Diagnosis not present

## 2022-03-18 DIAGNOSIS — D689 Coagulation defect, unspecified: Secondary | ICD-10-CM | POA: Diagnosis not present

## 2022-03-18 DIAGNOSIS — D631 Anemia in chronic kidney disease: Secondary | ICD-10-CM | POA: Diagnosis not present

## 2022-03-18 DIAGNOSIS — N186 End stage renal disease: Secondary | ICD-10-CM | POA: Diagnosis not present

## 2022-03-18 DIAGNOSIS — N2581 Secondary hyperparathyroidism of renal origin: Secondary | ICD-10-CM | POA: Diagnosis not present

## 2022-03-18 DIAGNOSIS — Z992 Dependence on renal dialysis: Secondary | ICD-10-CM | POA: Diagnosis not present

## 2022-03-20 DIAGNOSIS — N2581 Secondary hyperparathyroidism of renal origin: Secondary | ICD-10-CM | POA: Diagnosis not present

## 2022-03-20 DIAGNOSIS — Z992 Dependence on renal dialysis: Secondary | ICD-10-CM | POA: Diagnosis not present

## 2022-03-20 DIAGNOSIS — N186 End stage renal disease: Secondary | ICD-10-CM | POA: Diagnosis not present

## 2022-03-20 DIAGNOSIS — D631 Anemia in chronic kidney disease: Secondary | ICD-10-CM | POA: Diagnosis not present

## 2022-03-20 DIAGNOSIS — D689 Coagulation defect, unspecified: Secondary | ICD-10-CM | POA: Diagnosis not present

## 2022-03-20 DIAGNOSIS — E1129 Type 2 diabetes mellitus with other diabetic kidney complication: Secondary | ICD-10-CM | POA: Diagnosis not present

## 2022-03-23 DIAGNOSIS — N2581 Secondary hyperparathyroidism of renal origin: Secondary | ICD-10-CM | POA: Diagnosis not present

## 2022-03-23 DIAGNOSIS — E1129 Type 2 diabetes mellitus with other diabetic kidney complication: Secondary | ICD-10-CM | POA: Diagnosis not present

## 2022-03-23 DIAGNOSIS — D689 Coagulation defect, unspecified: Secondary | ICD-10-CM | POA: Diagnosis not present

## 2022-03-23 DIAGNOSIS — Z992 Dependence on renal dialysis: Secondary | ICD-10-CM | POA: Diagnosis not present

## 2022-03-23 DIAGNOSIS — N186 End stage renal disease: Secondary | ICD-10-CM | POA: Diagnosis not present

## 2022-03-23 DIAGNOSIS — D631 Anemia in chronic kidney disease: Secondary | ICD-10-CM | POA: Diagnosis not present

## 2022-03-24 ENCOUNTER — Encounter: Payer: Self-pay | Admitting: Internal Medicine

## 2022-03-24 ENCOUNTER — Ambulatory Visit (INDEPENDENT_AMBULATORY_CARE_PROVIDER_SITE_OTHER): Payer: Medicare Other | Admitting: Internal Medicine

## 2022-03-24 DIAGNOSIS — E1129 Type 2 diabetes mellitus with other diabetic kidney complication: Secondary | ICD-10-CM

## 2022-03-24 DIAGNOSIS — M25561 Pain in right knee: Secondary | ICD-10-CM

## 2022-03-24 DIAGNOSIS — K559 Vascular disorder of intestine, unspecified: Secondary | ICD-10-CM

## 2022-03-24 DIAGNOSIS — F419 Anxiety disorder, unspecified: Secondary | ICD-10-CM | POA: Diagnosis not present

## 2022-03-24 DIAGNOSIS — M25562 Pain in left knee: Secondary | ICD-10-CM | POA: Diagnosis not present

## 2022-03-24 DIAGNOSIS — M25511 Pain in right shoulder: Secondary | ICD-10-CM | POA: Diagnosis not present
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
## 2022-03-24 NOTE — Assessment & Plan Note (Signed)
On Oxycodone 15 mg qid - per Dr Nelva Bush ?Blue-Emu cream was recommended to use 2-3 times a day ? ?
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
## 2022-03-24 NOTE — Assessment & Plan Note (Signed)
Cont on Actos  ?

## 2022-03-24 NOTE — Patient Instructions (Signed)
Blue-Emu cream - use 2-3 times a day ?Rice sock heating pad ?TENs ? ?

## 2022-03-24 NOTE — Assessment & Plan Note (Signed)
F/u Dr Henrene Pastor ?Recurrent - 2nd episode ?Hospital notes, tests reviewed ? ?

## 2022-03-25 DIAGNOSIS — N186 End stage renal disease: Secondary | ICD-10-CM | POA: Diagnosis not present

## 2022-03-25 DIAGNOSIS — D689 Coagulation defect, unspecified: Secondary | ICD-10-CM | POA: Diagnosis not present

## 2022-03-25 DIAGNOSIS — D631 Anemia in chronic kidney disease: Secondary | ICD-10-CM | POA: Diagnosis not present

## 2022-03-25 DIAGNOSIS — Z992 Dependence on renal dialysis: Secondary | ICD-10-CM | POA: Diagnosis not present

## 2022-03-25 DIAGNOSIS — E1129 Type 2 diabetes mellitus with other diabetic kidney complication: Secondary | ICD-10-CM | POA: Diagnosis not present

## 2022-03-25 DIAGNOSIS — N2581 Secondary hyperparathyroidism of renal origin: Secondary | ICD-10-CM | POA: Diagnosis not present

## 2022-03-27 DIAGNOSIS — N186 End stage renal disease: Secondary | ICD-10-CM | POA: Diagnosis not present

## 2022-03-27 DIAGNOSIS — Z992 Dependence on renal dialysis: Secondary | ICD-10-CM | POA: Diagnosis not present

## 2022-03-27 DIAGNOSIS — E1129 Type 2 diabetes mellitus with other diabetic kidney complication: Secondary | ICD-10-CM | POA: Diagnosis not present

## 2022-03-27 DIAGNOSIS — N2581 Secondary hyperparathyroidism of renal origin: Secondary | ICD-10-CM | POA: Diagnosis not present

## 2022-03-27 DIAGNOSIS — D689 Coagulation defect, unspecified: Secondary | ICD-10-CM | POA: Diagnosis not present

## 2022-03-27 DIAGNOSIS — D631 Anemia in chronic kidney disease: Secondary | ICD-10-CM | POA: Diagnosis not present

## 2022-03-30 DIAGNOSIS — N186 End stage renal disease: Secondary | ICD-10-CM | POA: Diagnosis not present

## 2022-03-30 DIAGNOSIS — D631 Anemia in chronic kidney disease: Secondary | ICD-10-CM | POA: Diagnosis not present

## 2022-03-30 DIAGNOSIS — D689 Coagulation defect, unspecified: Secondary | ICD-10-CM | POA: Diagnosis not present

## 2022-03-30 DIAGNOSIS — N2581 Secondary hyperparathyroidism of renal origin: Secondary | ICD-10-CM | POA: Diagnosis not present

## 2022-03-30 DIAGNOSIS — E1129 Type 2 diabetes mellitus with other diabetic kidney complication: Secondary | ICD-10-CM | POA: Diagnosis not present

## 2022-03-30 DIAGNOSIS — Z992 Dependence on renal dialysis: Secondary | ICD-10-CM | POA: Diagnosis not present

## 2022-03-31 LAB — HM DIABETES EYE EXAM

## 2022-04-01 DIAGNOSIS — D631 Anemia in chronic kidney disease: Secondary | ICD-10-CM | POA: Diagnosis not present

## 2022-04-01 DIAGNOSIS — E1129 Type 2 diabetes mellitus with other diabetic kidney complication: Secondary | ICD-10-CM | POA: Diagnosis not present

## 2022-04-01 DIAGNOSIS — D689 Coagulation defect, unspecified: Secondary | ICD-10-CM | POA: Diagnosis not present

## 2022-04-01 DIAGNOSIS — Z992 Dependence on renal dialysis: Secondary | ICD-10-CM | POA: Diagnosis not present

## 2022-04-01 DIAGNOSIS — N186 End stage renal disease: Secondary | ICD-10-CM | POA: Diagnosis not present

## 2022-04-01 DIAGNOSIS — N2581 Secondary hyperparathyroidism of renal origin: Secondary | ICD-10-CM | POA: Diagnosis not present

## 2022-04-03 DIAGNOSIS — N186 End stage renal disease: Secondary | ICD-10-CM | POA: Diagnosis not present

## 2022-04-03 DIAGNOSIS — D631 Anemia in chronic kidney disease: Secondary | ICD-10-CM | POA: Diagnosis not present

## 2022-04-03 DIAGNOSIS — E1129 Type 2 diabetes mellitus with other diabetic kidney complication: Secondary | ICD-10-CM | POA: Diagnosis not present

## 2022-04-03 DIAGNOSIS — D689 Coagulation defect, unspecified: Secondary | ICD-10-CM | POA: Diagnosis not present

## 2022-04-03 DIAGNOSIS — N2581 Secondary hyperparathyroidism of renal origin: Secondary | ICD-10-CM | POA: Diagnosis not present

## 2022-04-03 DIAGNOSIS — Z992 Dependence on renal dialysis: Secondary | ICD-10-CM | POA: Diagnosis not present

## 2022-04-05 ENCOUNTER — Other Ambulatory Visit (HOSPITAL_COMMUNITY): Payer: Self-pay

## 2022-04-05 ENCOUNTER — Other Ambulatory Visit: Payer: Self-pay | Admitting: Internal Medicine

## 2022-04-05 MED ORDER — CYCLOBENZAPRINE HCL 10 MG PO TABS
10.0000 mg | ORAL_TABLET | Freq: Three times a day (TID) | ORAL | 0 refills | Status: DC | PRN
  Filled 2022-04-05: qty 270, 90d supply, fill #0

## 2022-04-06 ENCOUNTER — Other Ambulatory Visit (HOSPITAL_COMMUNITY): Payer: Self-pay

## 2022-04-06 DIAGNOSIS — D689 Coagulation defect, unspecified: Secondary | ICD-10-CM | POA: Diagnosis not present

## 2022-04-06 DIAGNOSIS — N186 End stage renal disease: Secondary | ICD-10-CM | POA: Diagnosis not present

## 2022-04-06 DIAGNOSIS — N2581 Secondary hyperparathyroidism of renal origin: Secondary | ICD-10-CM | POA: Diagnosis not present

## 2022-04-06 DIAGNOSIS — D631 Anemia in chronic kidney disease: Secondary | ICD-10-CM | POA: Diagnosis not present

## 2022-04-06 DIAGNOSIS — E1129 Type 2 diabetes mellitus with other diabetic kidney complication: Secondary | ICD-10-CM | POA: Diagnosis not present

## 2022-04-06 DIAGNOSIS — Z992 Dependence on renal dialysis: Secondary | ICD-10-CM | POA: Diagnosis not present

## 2022-04-06 MED ORDER — OXYCODONE HCL 15 MG PO TABS
15.0000 mg | ORAL_TABLET | Freq: Four times a day (QID) | ORAL | 0 refills | Status: DC | PRN
  Filled 2022-04-06: qty 120, 30d supply, fill #0

## 2022-04-07 ENCOUNTER — Other Ambulatory Visit (HOSPITAL_COMMUNITY): Payer: Self-pay

## 2022-04-08 ENCOUNTER — Other Ambulatory Visit (HOSPITAL_COMMUNITY): Payer: Self-pay

## 2022-04-08 DIAGNOSIS — D689 Coagulation defect, unspecified: Secondary | ICD-10-CM | POA: Diagnosis not present

## 2022-04-08 DIAGNOSIS — Z992 Dependence on renal dialysis: Secondary | ICD-10-CM | POA: Diagnosis not present

## 2022-04-08 DIAGNOSIS — D631 Anemia in chronic kidney disease: Secondary | ICD-10-CM | POA: Diagnosis not present

## 2022-04-08 DIAGNOSIS — N186 End stage renal disease: Secondary | ICD-10-CM | POA: Diagnosis not present

## 2022-04-08 DIAGNOSIS — E1129 Type 2 diabetes mellitus with other diabetic kidney complication: Secondary | ICD-10-CM | POA: Diagnosis not present

## 2022-04-08 DIAGNOSIS — N2581 Secondary hyperparathyroidism of renal origin: Secondary | ICD-10-CM | POA: Diagnosis not present

## 2022-04-09 ENCOUNTER — Other Ambulatory Visit (HOSPITAL_COMMUNITY): Payer: Self-pay

## 2022-04-10 DIAGNOSIS — D631 Anemia in chronic kidney disease: Secondary | ICD-10-CM | POA: Diagnosis not present

## 2022-04-10 DIAGNOSIS — E1129 Type 2 diabetes mellitus with other diabetic kidney complication: Secondary | ICD-10-CM | POA: Diagnosis not present

## 2022-04-10 DIAGNOSIS — N186 End stage renal disease: Secondary | ICD-10-CM | POA: Diagnosis not present

## 2022-04-10 DIAGNOSIS — N2581 Secondary hyperparathyroidism of renal origin: Secondary | ICD-10-CM | POA: Diagnosis not present

## 2022-04-10 DIAGNOSIS — D689 Coagulation defect, unspecified: Secondary | ICD-10-CM | POA: Diagnosis not present

## 2022-04-10 DIAGNOSIS — Z992 Dependence on renal dialysis: Secondary | ICD-10-CM | POA: Diagnosis not present

## 2022-04-11 DIAGNOSIS — E1022 Type 1 diabetes mellitus with diabetic chronic kidney disease: Secondary | ICD-10-CM | POA: Diagnosis not present

## 2022-04-11 DIAGNOSIS — N186 End stage renal disease: Secondary | ICD-10-CM | POA: Diagnosis not present

## 2022-04-11 DIAGNOSIS — Z992 Dependence on renal dialysis: Secondary | ICD-10-CM | POA: Diagnosis not present

## 2022-04-13 ENCOUNTER — Telehealth: Payer: Self-pay

## 2022-04-13 DIAGNOSIS — N186 End stage renal disease: Secondary | ICD-10-CM | POA: Diagnosis not present

## 2022-04-13 DIAGNOSIS — Z992 Dependence on renal dialysis: Secondary | ICD-10-CM | POA: Diagnosis not present

## 2022-04-13 DIAGNOSIS — R52 Pain, unspecified: Secondary | ICD-10-CM | POA: Diagnosis not present

## 2022-04-13 DIAGNOSIS — N2581 Secondary hyperparathyroidism of renal origin: Secondary | ICD-10-CM | POA: Diagnosis not present

## 2022-04-13 DIAGNOSIS — R519 Headache, unspecified: Secondary | ICD-10-CM | POA: Diagnosis not present

## 2022-04-13 NOTE — Chronic Care Management (AMB) (Cosign Needed)
? ? ?Chronic Care Management ?Pharmacy Assistant  ? ?Name: [name]  MRN: [id number] DOB: [birth year] ? ? ?Reason for Encounter: Disease State-General Adherence ?  ? ?Recent office visits:  ?03/24/22 Plotnikov, Evie Lacks, MD-PCP (Arthralgia of both lower legs) No orders or med changes ? ?Recent consult visits:  ?None since the last coordination call ? ?Hospital visits:  ?Medication Reconciliation was completed by comparing discharge summary, patient?s EMR and Pharmacy list, and upon discussion with patient. ? ?Admitted to the hospital on 03/09/22 due to sepsis. Discharge date was 03/12/22. Discharged from [facility].   ? ?New?Medications Started at [facility] Discharge:?? ?-started   ?Amoxicillin, blood glucose kit ? ?Medication Changes at Hospital Discharge: ?-Changed  ?Bupropion, fluoxetine,truetest test,accu-chek guide ? ?Medications Discontinued at Hospital Discharge: ?-Stopped  ?Amoxicillin, dicyclomine,docusate sodium, glimepiride,ketoconazole,loratadine ? ?Medications that remain the same after Hospital Discharge:??  ?-All other medications will remain the same.   ? ?Medications: ?Outpatient Encounter Medications as of 04/13/2022  ?Medication Sig Note  ? Accu-Chek Softclix Lancets lancets Use 1 lancet up to 4 times daily as directed   ? acetaminophen (TYLENOL) 500 MG tablet Take 1,000 mg by mouth 2 (two) times daily as needed for headache.   ? aspirin-acetaminophen-caffeine (EXCEDRIN MIGRAINE) 250-250-65 MG tablet Take 1 tablet by mouth daily as needed for headache or migraine. Max 3 doses in one week   ? B Complex-C-Folic Acid (RENA-VITE PO) Take 1 tablet by mouth every morning.   ? blood glucose meter kit and supplies Dispense based on patient and insurance preference. Used to check blood sugar daily, DX: E11.9 ?OneTouch   ? Blood Glucose Monitoring Suppl (ACCU-CHEK GUIDE ME) w/Device KIT Use as directed to check blood sugar four times daily.   ? buPROPion (WELLBUTRIN XL) 300 MG 24 hr tablet  Take 1 tablet (300 mg total) by mouth in the morning.   ? carboxymethylcellulose (REFRESH PLUS) 0.5 % SOLN Place 1 drop into both eyes 3 (three) times daily as needed (dry eyes/irritation).   ? cyclobenzaprine (FLEXERIL) 10 MG tablet Take 1 tablet (10 mg total) by mouth 3 (three) times daily as needed for muscle spasms.   ? FLUoxetine (PROZAC) 20 MG capsule Take 1 capsule (20 mg total) by mouth in the morning.   ? glucose blood (ACCU-CHEK GUIDE) test strip Use to check blood sugar up to four times daily.   ? glucose blood (TRUETEST TEST) test strip Used to check blood sugar daily, DX: E11.9   ? lidocaine (LIDODERM) 5 % 1 patch daily.   ? lidocaine (XYLOCAINE) 5 % ointment Apply 1 application topically 3 (three) times daily as needed for moderate pain.   ? lidocaine-prilocaine (EMLA) cream Apply 1 application topically See admin instructions. Apply topically to port access one hour prior to dialysis, cover with plastic- Tuesday, Thursday, Saturday   ? LORazepam (ATIVAN) 2 MG tablet Take 1 tablet (2 mg total) by mouth 2 (two) times daily as needed for anxiety   ? midodrine (PROAMATINE) 5 MG tablet Take 1-2 tablet by mouth three times a week as directed Take 30 minutes before dialysis and another tablet during middle of dialysis treatment (Patient taking differently: Take 5 mg by mouth See admin instructions. Take one tablet (5 mg) by mouth 30 minutes before dialysis (Tuesday, Thursday, Saturday); take one tablet (5 mg) during dialysis as needed for SBP <80)   ? multivitamin-lutein (OCUVITE-LUTEIN) CAPS capsule Take 1 capsule by mouth every morning.   ? naloxone (NARCAN) nasal spray 4 mg/0.1 mL  Take by nasal route every 3 minutes until patient awakes or EMS arrives. 03/10/2022: Hasn't needed  ? oxyCODONE (ROXICODONE) 15 MG immediate release tablet Take 1 tablet (15 mg total) by mouth 4 (four) times daily as needed.   ? pantoprazole (PROTONIX) 40 MG tablet TAKE 1 TABLET BY MOUTH  DAILY (Patient taking differently: Take  40 mg by mouth daily.)   ? pilocarpine (SALAGEN) 5 MG tablet TAKE 1 TABLET BY MOUTH  TWICE DAILY (Patient taking differently: Take 5 mg by mouth 2 (two) times daily.)   ? pioglitazone (ACTOS) 15 MG tablet TAKE 1 TABLET BY MOUTH  DAILY   ? polyethylene glycol (MIRALAX / GLYCOLAX) 17 g packet Take 17 g by mouth See admin instructions. Mix 17 g powder in 1/2 cup liquid and drink by mouth on Tuesday and Saturday evenings   ? PREVIDENT 5000 DRY MOUTH 1.1 % GEL dental gel Place onto teeth.   ? sevelamer carbonate (RENVELA) 800 MG tablet Take 800-1,600 mg by mouth See admin instructions. Take 2 tablets (1600 mg) by mouth three times daily with meals, take 1 tablet (800 mg) with snacks   ? triamcinolone (NASACORT) 55 MCG/ACT AERO nasal inhaler Place 2 sprays into the nose daily as needed (congestion).   ? ?No facility-administered encounter medications on file as of 04/13/2022.  ? ?[place] for General Review Call ? ? ?Chart Review: ? ?Have there been any documented new, changed, or discontinued medications since last visit? No (If yes, include name, dose, frequency, date) ?Has there been any documented recent hospitalizations or ED visits since last visit with Clinical Pharmacist? Yes ?Brief Summary (including medication and/or Diagnosis changes):Stopped :Amoxicillin, dicyclomine,docusate sodium, glimepiride,ketoconazole,loratadine ? ? ?Adherence Review: ? ?Does the Clinical Pharmacist Assistant have access to adherence rates? Yes ?Adherence rates for STAR metric medications (List medication(s)/day supply/ last 2 fill dates). ?Adherence rates for medications indicated for disease state being reviewed (List medication(s)/day supply/ last 2 fill dates). ?Does the patient have >5 day gap between last estimated fill dates for any of the above medications or other medication gaps? No ?Reason for medication gaps. ? ? ?Disease State Questions: ? ?Able to connect with Patient? Yes ?Did patient have any problems with  their health recently? No ?Note problems and Concerns: ?Have you had any admissions or emergency room visits or worsening of your condition(s) since last visit? No ?Details of ED visit, hospital visit and/or worsening condition(s): ?Have you had any visits with new specialists or providers since your last visit? No ?Explain: ?Have you had any new health care problem(s) since your last visit? Yes ?New problem(s) reported:Patient states that she has to see a oral surgeon for tooth pain ?Have you run out of any of your medications since you last spoke with clinical pharmacist? No ?What caused you to run out of your medications? ?Are there any medications you are not taking as prescribed? No ?What kept you from taking your medications as prescribed? ?Are you having any issues or side effects with your medications? No ?Note of issues or side effects: ?Do you have any other health concerns or questions you want to discuss with your Clinical Pharmacist before your next visit? No ?Note additional concerns and questions from Patient. ?Are there any health concerns that you feel we can do a better job addressing? No ?Note Patient's response. ?Are you having any problems with any of the following since the last visit: (select all that apply) ? None ? Details: ?12. Any falls since last  visit? No ? Details: ?13. Any increased or uncontrolled pain since last visit? Yes ? Details:Patient states that she stays in pain but there is nothing that can really be done. She states that she is taling pain meds for her issues ?14. Next visit Type: telephone ?      Visit with:Clinical Pharmacist ?       Date:05/17/22 ?       Time:2:45pm ? ?15. Additional Details? No ?   ?Care Gaps: ?Colonoscopy-03/10/22 ?Diabetic Foot Exam-04/28/16 ?Ophthalmology-04/13/15 ?Dexa Scan -NA  ?Annual Well Visit - NA ?Micro albumin-NA ?Hemoglobin A1c- 03/10/22 ? ?Star Rating Drugs: ?Pioglitazone 15 mg-last fill 04/05/22 100 ds ? ?Ethelene Hal ?Clinical Pharmacist  Assistant ?[phone]  ?

## 2022-04-15 ENCOUNTER — Telehealth: Payer: Self-pay

## 2022-04-15 DIAGNOSIS — N2581 Secondary hyperparathyroidism of renal origin: Secondary | ICD-10-CM | POA: Diagnosis not present

## 2022-04-15 DIAGNOSIS — G894 Chronic pain syndrome: Secondary | ICD-10-CM | POA: Diagnosis not present

## 2022-04-15 DIAGNOSIS — N186 End stage renal disease: Secondary | ICD-10-CM | POA: Diagnosis not present

## 2022-04-15 DIAGNOSIS — R519 Headache, unspecified: Secondary | ICD-10-CM | POA: Diagnosis not present

## 2022-04-15 DIAGNOSIS — M5030 Other cervical disc degeneration, unspecified cervical region: Secondary | ICD-10-CM | POA: Diagnosis not present

## 2022-04-15 DIAGNOSIS — R52 Pain, unspecified: Secondary | ICD-10-CM | POA: Diagnosis not present

## 2022-04-15 DIAGNOSIS — Z992 Dependence on renal dialysis: Secondary | ICD-10-CM | POA: Diagnosis not present

## 2022-04-15 NOTE — Telephone Encounter (Signed)
? ?  Pre-operative Risk Assessment  ?  ?Patient Name: [name]  ?DOB: 17-Dec-1950 ?MRN: [id number]  ? ?  ? ?Request for Surgical Clearance   ? ?Procedure:   Rt Reverse Shoulder Arthroplasty  ? ?Date of Surgery:  Clearance TBD                              ?   ?Surgeon:  Dr. Justice Britain  ?Surgeon's Group or Practice Name:  [facility] ?Phone number:  [phone] ?Fax number:  [phone] ?  ?Type of Clearance Requested:   ?- Medical  ?  ?Type of Anesthesia:  General  ?  ?Additional requests/questions:  N/A ? ?Signed, ?[place]   ?04/15/2022, 4:23 PM   ?

## 2022-04-16 ENCOUNTER — Other Ambulatory Visit (HOSPITAL_COMMUNITY): Payer: Self-pay

## 2022-04-16 MED ORDER — AMOXICILLIN 500 MG PO CAPS
500.0000 mg | ORAL_CAPSULE | Freq: Three times a day (TID) | ORAL | 0 refills | Status: DC
  Filled 2022-04-16: qty 15, 5d supply, fill #0

## 2022-04-17 DIAGNOSIS — N186 End stage renal disease: Secondary | ICD-10-CM | POA: Diagnosis not present

## 2022-04-17 DIAGNOSIS — R519 Headache, unspecified: Secondary | ICD-10-CM | POA: Diagnosis not present

## 2022-04-17 DIAGNOSIS — R52 Pain, unspecified: Secondary | ICD-10-CM | POA: Diagnosis not present

## 2022-04-17 DIAGNOSIS — N2581 Secondary hyperparathyroidism of renal origin: Secondary | ICD-10-CM | POA: Diagnosis not present

## 2022-04-17 DIAGNOSIS — Z992 Dependence on renal dialysis: Secondary | ICD-10-CM | POA: Diagnosis not present

## 2022-04-19 NOTE — Telephone Encounter (Signed)
Pt is scheduled to see Dr. Sallyanne Kuster on May 18, Clearance will be addressed at that time.  ?

## 2022-04-20 DIAGNOSIS — N2581 Secondary hyperparathyroidism of renal origin: Secondary | ICD-10-CM | POA: Diagnosis not present

## 2022-04-20 DIAGNOSIS — N186 End stage renal disease: Secondary | ICD-10-CM | POA: Diagnosis not present

## 2022-04-20 DIAGNOSIS — R519 Headache, unspecified: Secondary | ICD-10-CM | POA: Diagnosis not present

## 2022-04-20 DIAGNOSIS — R52 Pain, unspecified: Secondary | ICD-10-CM | POA: Diagnosis not present

## 2022-04-20 DIAGNOSIS — Z992 Dependence on renal dialysis: Secondary | ICD-10-CM | POA: Diagnosis not present

## 2022-04-21 ENCOUNTER — Ambulatory Visit: Payer: Medicare Other | Admitting: Internal Medicine

## 2022-04-21 ENCOUNTER — Encounter: Payer: Self-pay | Admitting: Internal Medicine

## 2022-04-21 VITALS — BP 148/66 | HR 79 | Ht 62.5 in | Wt 217.2 lb

## 2022-04-21 DIAGNOSIS — K559 Vascular disorder of intestine, unspecified: Secondary | ICD-10-CM | POA: Diagnosis not present

## 2022-04-21 DIAGNOSIS — Z992 Dependence on renal dialysis: Secondary | ICD-10-CM | POA: Diagnosis not present

## 2022-04-21 DIAGNOSIS — T82858A Stenosis of vascular prosthetic devices, implants and grafts, initial encounter: Secondary | ICD-10-CM | POA: Diagnosis not present

## 2022-04-21 DIAGNOSIS — I871 Compression of vein: Secondary | ICD-10-CM | POA: Diagnosis not present

## 2022-04-21 DIAGNOSIS — N186 End stage renal disease: Secondary | ICD-10-CM | POA: Diagnosis not present

## 2022-04-21 NOTE — Patient Instructions (Addendum)
If you are age 70 or older, your body mass index should be between 23-30. Your Body mass index is 39.09 kg/m?Marland Kitchen If this is out of the aforementioned range listed, please consider follow up with your Primary Care Provider. ? ?If you are age 1 or younger, your body mass index should be between 19-25. Your Body mass index is 39.09 kg/m?Marland Kitchen If this is out of the aformentioned range listed, please consider follow up with your Primary Care Provider.  ? ?________________________________________________________ ? ?The [facility] providers would like to encourage you to use [facility] to communicate with providers for non-urgent requests or questions.  Due to long hold times on the telephone, sending your provider a message by [facility] may be a faster and more efficient way to get a response.  Please allow 48 business hours for a response.  Please remember that this is for non-urgent requests.  ?_______________________________________________________ ? ?Follow up as needed. ? ? ?Thank you for entrusting me with your care and for choosing [employer], ?Dr. [name] ? ? ? ?

## 2022-04-22 ENCOUNTER — Encounter: Payer: Self-pay | Admitting: Internal Medicine

## 2022-04-22 DIAGNOSIS — R519 Headache, unspecified: Secondary | ICD-10-CM | POA: Diagnosis not present

## 2022-04-22 DIAGNOSIS — R52 Pain, unspecified: Secondary | ICD-10-CM | POA: Diagnosis not present

## 2022-04-22 DIAGNOSIS — N186 End stage renal disease: Secondary | ICD-10-CM | POA: Diagnosis not present

## 2022-04-22 DIAGNOSIS — Z992 Dependence on renal dialysis: Secondary | ICD-10-CM | POA: Diagnosis not present

## 2022-04-22 DIAGNOSIS — N2581 Secondary hyperparathyroidism of renal origin: Secondary | ICD-10-CM | POA: Diagnosis not present

## 2022-04-22 NOTE — Progress Notes (Signed)
HISTORY OF PRESENT ILLNESS: ? ?[name] is a 71 y.o. female with multiple medical problems as listed below who presents today for a scheduled posthospitalization follow-up.  She was last seen in this office January 06, 2022 regarding thrombosed hemorrhoid.  She also has a history of microscopic colitis.  See that dictation for details. ? ?The patient was in her usual state of health until March 09, 2022 when she developed severe abdominal pain which brought her to the emergency room.  CT scan imaging revealed significant colitis involving the right colon.  She subsequently underwent colonoscopy and was found to have severe right-sided colitis.  Biopsies revealed changes consistent with ischemia.  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  Apparently there have been reports of this drug associated with ischemic colitis.  It was stopped.  Since her hospital discharge she has been doing well.  Her principal complaint is orthopedic related pains and aches.  She continues on dialysis.  Bowel habits have returned to her baseline.  No problems with abdominal pain. ? ?REVIEW OF SYSTEMS: ? ?All non-GI ROS negative except for arthritis ? ?Past Medical History:  ?Diagnosis Date  ? Anemia   ? Anxiety   ? Brain tumor (benign) ([place])   ? Colitis 2010  ? microscopic- Dr Henrene Pastor  ? Depression   ? Diabetes mellitus   ? type II  ? Dyspnea   ? with exertion  ? ESRD (end stage renal disease) ([place])   ? [place]   ? Fibromyalgia   ? GERD (gastroesophageal reflux disease)   ? Headache   ? History of blood transfusion   ? after knee surgery  ? Hypertension   ? discontinued all diuretics and antihypertensives  ? IBS (irritable bowel syndrome)   ? LBP (low back pain)   ? Neuropathy   ? feet bilat   ? Osteoarthritis   ? Osteopenia   ? Pneumonia   ? hx of 2014   ? Rotator cuff tear, right   ? Sinusitis   ? currently being treated with antibiotic will complete 03/04/2015  ? ? ?Past Surgical History:   ?Procedure Laterality Date  ? AV FISTULA PLACEMENT Right 09/12/2020  ? Procedure: RIGHT ARTERIOVENOUS (AV) FISTULA CREATION;  Surgeon: Elam Dutch, MD;  Location: [place];  Service: Vascular;  Laterality: Right;  ? BASCILIC VEIN TRANSPOSITION Right 10/27/2020  ? Procedure: RIGHT UPPER EXTREMITY SECOND STAGE BASCILIC VEIN TRANSPOSITION;  Surgeon: Elam Dutch, MD;  Location: [place];  Service: Vascular;  Laterality: Right;  ? BIOPSY  03/11/2022  ? Procedure: BIOPSY;  Surgeon: Irving Copas., MD;  Location: [place];  Service: Gastroenterology;;  ? COLONOSCOPY WITH PROPOFOL N/A 03/11/2022  ? Procedure: COLONOSCOPY WITH PROPOFOL;  Surgeon: Mansouraty, Telford Nab., MD;  Location: [place];  Service: Gastroenterology;  Laterality: N/A;  ? foramen magnum ependymoma surgery  2003  ? Dr Rita Ohara  ? JOINT REPLACEMENT Bilateral   ? NASAL SINUS SURGERY    ? 1973   ? TONSILLECTOMY    ? TOTAL KNEE ARTHROPLASTY    ? L 2008, R 2009, R 2016- Dr Maureen Ralphs  ? TOTAL KNEE REVISION Right 03/05/2015  ? Procedure: RIGHT TOTAL KNEE ARTHROPLASTY REVISION;  Surgeon: Gaynelle Arabian, MD;  Location: [facility];  Service: Orthopedics;  Laterality: Right;  ? ? ?Social History ?[name]  reports that she quit smoking about 33 years ago. Her smoking use included cigarettes. She has a 20.00 pack-year smoking history. She has never  used smokeless tobacco. She reports that she does not currently use alcohol. She reports that she does not use drugs. ? ?family history includes Cancer in her brother; Crohn's disease in her maternal uncle; Depression in her mother; Diabetes in her brother, brother, father, mother, and another family member; Heart attack in her brother; Heart disease in her brother and brother; Hyperlipidemia in her brother, brother, and father; Hypertension in her brother, brother, father, and mother; Stroke in her brother; Stroke (age of onset: 68) in her mother. ? ?Allergies  ?Allergen Reactions  ? Erythromycin  Diarrhea and Nausea And Vomiting  ? Gabapentin Other (See Comments)  ?  Confusion and falling   ? ? ?  ? ?PHYSICAL EXAMINATION: ?Vital signs: BP (!) 148/66   Pulse 79   Ht 5' 2.5" (1.588 m)   Wt 217 lb 3.2 oz (98.5 kg)   BMI 39.09 kg/m?   ?Constitutional: generally well-appearing, no acute distress ?Psychiatric: alert and oriented x3, cooperative ?Eyes: extraocular movements intact, anicteric, conjunctiva pink ?Mouth: oral pharynx moist, no lesions ?Neck: supple no lymphadenopathy ?Cardiovascular: heart regular rate and rhythm, no murmur ?Lungs: clear to auscultation bilaterally ?Abdomen: soft, nontender, nondistended, no obvious ascites, no peritoneal signs, normal bowel sounds, no organomegaly ?Rectal: Omitted ?Extremities: no clubbing, cyanosis, or lower extremity edema bilaterally ?Skin: no lesions on visible extremities ?Neuro: No focal deficits.  Cranial nerves intact ? ?ASSESSMENT: ? ?1.  Recent hospitalization with right-sided ischemic colitis.  Question drug related.  Question related to dialysis associated drop in blood pressure.  She has made complete clinical recovery. ?2.  History of microscopic colitis ?3.  Multiple medical problems ? ? ?PLAN: ? ?1.  Discussion today on ischemic colitis ?2.  No additional recommendations from GI perspective at this time ?3.  GI follow-up as needed ? ? ? ? ?  ?

## 2022-04-23 ENCOUNTER — Telehealth: Payer: Self-pay | Admitting: Internal Medicine

## 2022-04-23 NOTE — Telephone Encounter (Signed)
Woke up with abd pain across the upper abd. She has a history of ischemic colitis.  (Recent hospitalization)  She states it feels the same as when she had ischemic colitis.  No diarrhea or constipation. Had a normal BM yesterday.  No fever, no bleeding.  She has oxycodone on hand and took that it has helped minimally.  Pain when she woke up was 9/10 and now it is a 7/10.  She has been advised to go to the ED for eval due to her history and pain level but she prefers to wait and see what Dr Henrene Pastor recommends.  Please advise.

## 2022-04-23 NOTE — Telephone Encounter (Signed)
I am so sorry that she is experiencing significant recurrent pain.  Given her history of ischemic colitis, the severe changes on her last colonoscopy, that would be best for her to go to the emergency room for assessment and pain management.  The treatment for ischemic colitis is supportive.  Thus, nothing that I could recommend or provide that would be beneficial. ?

## 2022-04-23 NOTE — Telephone Encounter (Signed)
Inbound call from patient stating she is having Ischemic colitis pains and is hurting really bad. Patient is seeking advice what she can do to help. Please advise.  ?

## 2022-04-23 NOTE — Telephone Encounter (Signed)
The pt has been advised of the ED recommendation and agrees.  She will head that way now.  ?

## 2022-04-24 DIAGNOSIS — N186 End stage renal disease: Secondary | ICD-10-CM | POA: Diagnosis not present

## 2022-04-24 DIAGNOSIS — N2581 Secondary hyperparathyroidism of renal origin: Secondary | ICD-10-CM | POA: Diagnosis not present

## 2022-04-24 DIAGNOSIS — Z992 Dependence on renal dialysis: Secondary | ICD-10-CM | POA: Diagnosis not present

## 2022-04-24 DIAGNOSIS — R52 Pain, unspecified: Secondary | ICD-10-CM | POA: Diagnosis not present

## 2022-04-24 DIAGNOSIS — R519 Headache, unspecified: Secondary | ICD-10-CM | POA: Diagnosis not present

## 2022-04-27 DIAGNOSIS — N186 End stage renal disease: Secondary | ICD-10-CM | POA: Diagnosis not present

## 2022-04-27 DIAGNOSIS — R52 Pain, unspecified: Secondary | ICD-10-CM | POA: Diagnosis not present

## 2022-04-27 DIAGNOSIS — R519 Headache, unspecified: Secondary | ICD-10-CM | POA: Diagnosis not present

## 2022-04-27 DIAGNOSIS — Z992 Dependence on renal dialysis: Secondary | ICD-10-CM | POA: Diagnosis not present

## 2022-04-27 DIAGNOSIS — N2581 Secondary hyperparathyroidism of renal origin: Secondary | ICD-10-CM | POA: Diagnosis not present

## 2022-04-29 ENCOUNTER — Ambulatory Visit: Payer: Medicare Other | Admitting: Cardiovascular Disease

## 2022-04-29 ENCOUNTER — Encounter: Payer: Self-pay | Admitting: Cardiovascular Disease

## 2022-04-29 VITALS — BP 120/72 | HR 78 | Ht 62.0 in | Wt 212.4 lb

## 2022-04-29 DIAGNOSIS — N186 End stage renal disease: Secondary | ICD-10-CM | POA: Diagnosis not present

## 2022-04-29 DIAGNOSIS — E7800 Pure hypercholesterolemia, unspecified: Secondary | ICD-10-CM | POA: Diagnosis not present

## 2022-04-29 DIAGNOSIS — I700 Atherosclerosis of aorta: Secondary | ICD-10-CM | POA: Diagnosis not present

## 2022-04-29 DIAGNOSIS — E1122 Type 2 diabetes mellitus with diabetic chronic kidney disease: Secondary | ICD-10-CM | POA: Diagnosis not present

## 2022-04-29 DIAGNOSIS — R52 Pain, unspecified: Secondary | ICD-10-CM | POA: Diagnosis not present

## 2022-04-29 DIAGNOSIS — R519 Headache, unspecified: Secondary | ICD-10-CM | POA: Diagnosis not present

## 2022-04-29 DIAGNOSIS — N2581 Secondary hyperparathyroidism of renal origin: Secondary | ICD-10-CM | POA: Diagnosis not present

## 2022-04-29 DIAGNOSIS — I953 Hypotension of hemodialysis: Secondary | ICD-10-CM

## 2022-04-29 DIAGNOSIS — Z992 Dependence on renal dialysis: Secondary | ICD-10-CM

## 2022-04-29 NOTE — Progress Notes (Signed)
Cardiology Office Note:    Date:  04/29/2022   ID:  [name], DOB May 29, 1951, MRN [id number]  PCP:  Cassandria Anger, MD   [facility] Providers Cardiologist:  None     Referring MD: Cassandria Anger, MD   Chief Complaint  Patient presents with   Hypotension     History of Present Illness:    [name] is a 71 y.o. female with a hx of end-stage renal disease (per patient report due to prolonged NSAID use for arthritis), type 2 diabetes mellitus also complicated by neuropathy and a variety of joint problems.  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  She has had less frequent problems with hypotension during dialysis.  Although sometimes her systolic blood pressure has reportedly been as low as 50 mmHg, she has not had to interrupt hemodialysis.  Sometimes they have to place her in Trendelenburg.  She is wearing compression stockings, an abdominal binder and takes midodrine.  She does not have problems with hypotension between dialysis sessions.  She had dialysis earlier today without major events.  Her blood pressure at this appointment is excellent at 120/72.  Her echocardiogram performed last December shows excellent findings with normal left ventricular systolic and diastolic function and no major valvular abnormalities.  She had another episode of abdominal pain in late March.  Findings were suggestive of ischemic colitis.  She spent 3 days in the hospital and had a colonoscopy.  Reportedly, sevelamer has sometimes been associated with colitis.  This medication has been discontinued and she now takes [place].  Glucose control remains excellent with a hemoglobin A1c of 6.3%.  Reviewed the CT of her abdomen.  She does have evidence of atherosclerotic plaque in the abdominal aorta, but it is relatively mild for a patient on dialysis and there is clear patency of all 3 major mesenteric vessels.  She previously was hospitalized with  abdominal pain in August 2022 and was found to have inflammation of the terminal ileum.  She is required another dilation procedure on her right upper arm AV fistula.  She is on hemodialysis 3 days a week on a Tuesday Thursday Saturday schedule.  Her nephrologist is [name].  She is now anuric.  Her dry weight is 96.5 kg.  A random a.m. cortisol checked in August was 12.9, making adrenal insufficiency less likely.  TSH was normal a year ago.  A contrast CT of the chest following an accident in October 2022 showed that the coronary arteries, aortic arch and branch vessels have atherosclerotic disease.  She does not have angina or dyspnea during the episodes of hypotension or during daily activities.  She does not have palpitations and has not experienced syncope.  Metabolic control remains excellent with a hemoglobin A1c of 6.3%.  She does not have a recent lipid panel.  [name] is interested in a possible kidney transplant but has been told that her problems with hypotension and the need for midodrine preclude her listing as a candidate.  Past Medical History:  Diagnosis Date   Anemia    Anxiety    Brain tumor (benign) ([place])    Colitis 2010   microscopic- Dr Henrene Pastor   Depression    Diabetes mellitus    type II   Dyspnea    with exertion   ESRD (end stage renal disease) ([place])    TTUSAT [address]    Fibromyalgia    GERD (gastroesophageal  reflux disease)    Headache    History of blood transfusion    after knee surgery   Hypertension    discontinued all diuretics and antihypertensives   IBS (irritable bowel syndrome)    LBP (low back pain)    Neuropathy    feet bilat    Osteoarthritis    Osteopenia    Pneumonia    hx of 2014    Rotator cuff tear, right    Sinusitis    currently being treated with antibiotic will complete 03/04/2015    Past Surgical History:  Procedure Laterality Date   AV FISTULA PLACEMENT Right 09/12/2020   Procedure: RIGHT ARTERIOVENOUS (AV) FISTULA  CREATION;  Surgeon: Elam Dutch, MD;  Location: [place];  Service: Vascular;  Laterality: Right;   [place] Right 10/27/2020   Procedure: RIGHT UPPER EXTREMITY SECOND STAGE [place];  Surgeon: Elam Dutch, MD;  Location: [place];  Service: Vascular;  Laterality: Right;   BIOPSY  03/11/2022   Procedure: BIOPSY;  Surgeon: Irving Copas., MD;  Location: [facility] ENDOSCOPY;  Service: Gastroenterology;;   COLONOSCOPY WITH PROPOFOL N/A 03/11/2022   Procedure: COLONOSCOPY WITH PROPOFOL;  Surgeon: Irving Copas., MD;  Location: [place];  Service: Gastroenterology;  Laterality: N/A;   foramen magnum ependymoma surgery  2003   Dr Rita Ohara   JOINT REPLACEMENT Bilateral    NASAL SINUS SURGERY     1973    TONSILLECTOMY     TOTAL KNEE ARTHROPLASTY     L 2008, R 2009, R 2016- Dr Maureen Ralphs   TOTAL KNEE REVISION Right 03/05/2015   Procedure: RIGHT TOTAL KNEE ARTHROPLASTY REVISION;  Surgeon: Gaynelle Arabian, MD;  Location: [facility];  Service: Orthopedics;  Laterality: Right;    Current Medications: Current Meds  Medication Sig   Accu-Chek Softclix Lancets lancets Use 1 lancet up to 4 times daily as directed   acetaminophen (TYLENOL) 500 MG tablet Take 1,000 mg by mouth 2 (two) times daily as needed for headache.   aspirin-acetaminophen-caffeine (EXCEDRIN MIGRAINE) 250-250-65 MG tablet Take 1 tablet by mouth daily as needed for headache or migraine. Max 3 doses in one week   B Complex-C-Folic Acid (RENA-VITE PO) Take 1 tablet by mouth every morning.   blood glucose meter kit and supplies Dispense based on patient and insurance preference. Used to check blood sugar daily, DX: E11.9 OneTouch   Blood Glucose Monitoring Suppl (ACCU-CHEK GUIDE ME) w/Device KIT Use as directed to check blood sugar four times daily.   buPROPion (WELLBUTRIN XL) 300 MG 24 hr tablet Take 1 tablet (300 mg total) by mouth in the morning.   carboxymethylcellulose (REFRESH PLUS) 0.5  % SOLN Place 1 drop into both eyes 3 (three) times daily as needed (dry eyes/irritation).   cyclobenzaprine (FLEXERIL) 10 MG tablet Take 1 tablet (10 mg total) by mouth 3 (three) times daily as needed for muscle spasms.   ferric citrate (AURYXIA) 1 GM 210 MG(Fe) tablet Take 210 mg by mouth 3 (three) times daily with meals.   FLUoxetine (PROZAC) 20 MG capsule Take 1 capsule (20 mg total) by mouth in the morning.   glucose blood (ACCU-CHEK GUIDE) test strip Use to check blood sugar up to four times daily.   glucose blood (TRUETEST TEST) test strip Used to check blood sugar daily, DX: E11.9   lidocaine (LIDODERM) 5 % 1 patch daily.   lidocaine (XYLOCAINE) 5 % ointment Apply 1 application topically 3 (three) times daily as needed for moderate  pain.   lidocaine-prilocaine (EMLA) cream Apply 1 application topically See admin instructions. Apply topically to port access one hour prior to dialysis, cover with plastic- Tuesday, Thursday, Saturday   LORazepam (ATIVAN) 2 MG tablet Take 1 tablet (2 mg total) by mouth 2 (two) times daily as needed for anxiety   midodrine (PROAMATINE) 5 MG tablet Take 1-2 tablet by mouth three times a week as directed Take 30 minutes before dialysis and another tablet during middle of dialysis treatment (Patient taking differently: Take 5 mg by mouth See admin instructions. Take one tablet (5 mg) by mouth 30 minutes before dialysis (Tuesday, Thursday, Saturday); take one tablet (5 mg) during dialysis as needed for SBP <80)   multivitamin-lutein (OCUVITE-LUTEIN) CAPS capsule Take 1 capsule by mouth every morning.   naloxone (NARCAN) nasal spray 4 mg/0.1 mL Take by nasal route every 3 minutes until patient awakes or EMS arrives.   oxyCODONE (ROXICODONE) 15 MG immediate release tablet Take 1 tablet (15 mg total) by mouth 4 (four) times daily as needed.   pantoprazole (PROTONIX) 40 MG tablet TAKE 1 TABLET BY MOUTH  DAILY (Patient taking differently: Take 40 mg by mouth daily.)    pilocarpine (SALAGEN) 5 MG tablet TAKE 1 TABLET BY MOUTH  TWICE DAILY (Patient taking differently: Take 5 mg by mouth 2 (two) times daily.)   pioglitazone (ACTOS) 15 MG tablet TAKE 1 TABLET BY MOUTH  DAILY   polyethylene glycol (MIRALAX / GLYCOLAX) 17 g packet Take 17 g by mouth See admin instructions. Mix 17 g powder in 1/2 cup liquid and drink by mouth on Tuesday and Saturday evenings   PREVIDENT 5000 DRY MOUTH 1.1 % GEL dental gel Place onto teeth.   triamcinolone (NASACORT) 55 MCG/ACT AERO nasal inhaler Place 2 sprays into the nose daily as needed (congestion).     Allergies:   Erythromycin and Gabapentin   Social History   Socioeconomic History   Marital status: Single    Spouse name: Not on file   Number of children: Not on file   Years of education: Not on file   Highest education level: Not on file  Occupational History   Not on file  Tobacco Use   Smoking status: Former    Packs/day: 1.00    Years: 20.00    Pack years: 20.00    Types: Cigarettes    Quit date: 12/13/1988    Years since quitting: 33.3   Smokeless tobacco: Never  Vaping Use   Vaping Use: Never used  Substance and Sexual Activity   Alcohol use: Not Currently    Alcohol/week: 0.0 standard drinks    Comment: rarely   Drug use: No   Sexual activity: Not Currently  Other Topics Concern   Not on file  Social History Narrative   Not on file   Social Determinants of Health   Financial Resource Strain: Not on file  Food Insecurity: Not on file  Transportation Needs: Not on file  Physical Activity: Not on file  Stress: Not on file  Social Connections: Not on file     Family History: The patient's family history includes Cancer in her brother; Crohn's disease in her maternal uncle; Depression in her mother; Diabetes in her brother, brother, father, mother, and another family member; Heart attack in her brother; Heart disease in her brother and brother; Hyperlipidemia in her brother, brother, and father;  Hypertension in her brother, brother, father, and mother; Stroke in her brother; Stroke (age of onset: 7) in her  mother. There is no history of Colon cancer, Esophageal cancer, Stomach cancer, or Pancreatic cancer.  ROS:   Please see the history of present illness.     All other systems reviewed and are negative.  EKGs/Labs/Other Studies Reviewed:    The following studies were reviewed today: Echocardiogram 12/09/2021  1. Left ventricular ejection fraction, by estimation, is 60 to 65%. The  left ventricle has normal function. The left ventricle has no regional  wall motion abnormalities. Left ventricular diastolic parameters were  normal.   2. Right ventricular systolic function is normal. The right ventricular  size is normal.   3. The mitral valve is normal in structure. No evidence of mitral valve  regurgitation. No evidence of mitral stenosis.   4. The aortic valve is normal in structure. Aortic valve regurgitation is  not visualized. Aortic valve sclerosis is present, with no evidence of  aortic valve stenosis.   5. The inferior vena cava is normal in size with greater than 50%  respiratory variability, suggesting right atrial pressure of 3 mmHg.   EKG:  EKG is  ordered today.  Personally reviewed, it is a completely normal tracing (normal sinus rhythm) except for mildly prolonged QTc at 490 ms.  Had dialysis earlier today.  Recent Labs: 03/10/2022: ALT 21; Magnesium 2.2 03/12/2022: BUN 21; Creatinine, Ser 2.99; Hemoglobin 10.0; Platelets 170; Potassium 3.1; Sodium 133  Recent Lipid Panel    Component Value Date/Time   CHOL 215 (H) 09/25/2014 1601   TRIG 154.0 (H) 09/25/2014 1601   HDL 46.00 09/25/2014 1601   CHOLHDL 5 09/25/2014 1601   VLDL 30.8 09/25/2014 1601   LDLCALC 138 (H) 09/25/2014 1601     Risk Assessment/Calculations:           Physical Exam:    VS:  BP 120/72   Pulse 78   Ht $R'5\' 2"'ld$  (1.575 m)   Wt 212 lb 6.4 oz (96.3 kg)   SpO2 95%   BMI 38.85 kg/m      Wt Readings from Last 3 Encounters:  04/29/22 212 lb 6.4 oz (96.3 kg)  04/21/22 217 lb 3.2 oz (98.5 kg)  03/24/22 215 lb (97.5 kg)     General: Alert, oriented x3, no distress, severely obese Head: no evidence of trauma, PERRL, EOMI, no exophtalmos or lid lag, no myxedema, no xanthelasma; normal ears, nose and oropharynx Neck: normal jugular venous pulsations and no hepatojugular reflux; brisk carotid pulses without delay.  There is a continuous bruit over the left carotid and left subclavian area, likely radiating from her right upper arm AV fistula. Chest: clear to auscultation, no signs of consolidation by percussion or palpation, normal fremitus, symmetrical and full respiratory excursions Cardiovascular: normal position and quality of the apical impulse, regular rhythm, normal first and second heart sounds, no murmurs, rubs or gallops Abdomen: no tenderness or distention, no masses by palpation, no abnormal pulsatility or arterial bruits, normal bowel sounds, no hepatosplenomegaly Extremities: no clubbing, cyanosis or edema; 2+ radial, ulnar and brachial pulses bilaterally; 2+ right femoral, posterior tibial and dorsalis pedis pulses; 2+ left femoral, posterior tibial and dorsalis pedis pulses; no subclavian or femoral bruits Neurological: grossly nonfocal Psych: Normal mood and affect  ASSESSMENT:    1. Intra-dialytic hypotension   2. ESRD (end stage renal disease) on dialysis ([place])   3. Type 2 diabetes mellitus with ESRD (end-stage renal disease) ([place])   4. Atherosclerosis of aorta ([place])   5. Pure hypercholesterolemia     PLAN:  In order of problems listed above:  Dialysis related hypotension: Thankfully, without serious symptoms or consequences, but continues to be a problem.  Not a lot of other solutions other than the continued use of midodrine, compression stockings and abdominal binder.  The dose of midodrine can be increased to 20 mg daily if necessary, although this  is a higher dose than typically recommended.  Echocardiogram does not show LVH and does not suggest amyloidosis.  Fludrocortisone 0.2 mg administered 30 minutes before dialysis has been reportedly helpful in patients with intradialytic hypotension DM: Very well controlled with very low doses of medication.  Only other diabetic complication is neuropathy. Aortic and coronary atherosclerosis: Despite reported episode of ischemic colitis, she does not have evidence of significant atherosclerotic disease in the mesenteric branches.   HLP: A lipid panel from 2015 showed mild hypercholesterolemia with a total cholesterol 218, HDL 46, calculated LDL 138 and triglycerides 154 and older lipid profiles were better.  She has not tolerated treatment with statins in the past because of her issues with fibromyalgia.  We will get an updated lipid profile.           Medication Adjustments/Labs and Tests Ordered: Current medicines are reviewed at length with the patient today.  Concerns regarding medicines are outlined above.  Orders Placed This Encounter  Procedures   Lipid panel   EKG 12-Lead   No orders of the defined types were placed in this encounter.    Patient Instructions  Medication Instructions:  No changes  *If you need a refill on your cardiac medications before your next appointment, please call your pharmacy*   Lab Work: Your provider would like for you to return in in a few weeks to have the following labs drawn: fasting Labs. You do not need an appointment for the lab. Once in our office lobby there is a podium where you can sign in and ring the doorbell to alert [place] that you are here. The lab is open from 8:00 am to 4 pm; closed for lunch from 12:45pm-1:45pm.  You may also go to any of these [facility] locations:   [facility] ([place]) - [place] [address]    If you have labs (blood work) drawn  today and your tests are completely normal, you will receive your results only by: [employer] (if you have MyChart) OR A paper copy in the mail If you have any lab test that is abnormal or we need to change your treatment, we will call you to review the results.   Testing/Procedures: None ordered   Follow-Up: At [facility], you and your health needs are our priority.  As part of our continuing mission to provide you with exceptional heart care, we have created designated Provider Care Teams.  These Care Teams include your primary Cardiologist (physician) and Advanced Practice Providers (APPs -  Physician Assistants and Nurse Practitioners) who all work together to provide you with the care you need, when you need it.  We recommend signing up for the patient portal called "MyChart".  Sign up information is provided on this After Visit Summary.  MyChart is used to connect with patients for Virtual Visits (Telemedicine).  Patients are able to view lab/test results, encounter notes, upcoming appointments, etc.  Non-urgent messages can be sent to your provider as well.   To learn more about what you can do with MyChart, go to [email or web address].  Your next appointment:   12 month(s)  The format for your next appointment:   In Person  Provider:   Dr. Sallyanne Kuster     Signed, Sanda Klein, MD  04/29/2022 7:05 PM    [place]

## 2022-04-29 NOTE — Patient Instructions (Signed)
Medication Instructions:  No changes  *If you need a refill on your cardiac medications before your next appointment, please call your pharmacy*   Lab Work: Your provider would like for you to return in in a few weeks to have the following labs drawn: fasting Labs. You do not need an appointment for the lab. Once in our office lobby there is a podium where you can sign in and ring the doorbell to alert [place] that you are here. The lab is open from 8:00 am to 4 pm; closed for lunch from 12:45pm-1:45pm.  You may also go to any of these [facility] locations:   [facility] ([place]) - [place] [address]    If you have labs (blood work) drawn today and your tests are completely normal, you will receive your results only by: [employer] (if you have MyChart) OR A paper copy in the mail If you have any lab test that is abnormal or we need to change your treatment, we will call you to review the results.   Testing/Procedures: None ordered   Follow-Up: At [facility], you and your health needs are our priority.  As part of our continuing mission to provide you with exceptional heart care, we have created designated Provider Care Teams.  These Care Teams include your primary Cardiologist (physician) and Advanced Practice Providers (APPs -  Physician Assistants and Nurse Practitioners) who all work together to provide you with the care you need, when you need it.  We recommend signing up for the patient portal called "MyChart".  Sign up information is provided on this After Visit Summary.  MyChart is used to connect with patients for Virtual Visits (Telemedicine).  Patients are able to view lab/test results, encounter notes, upcoming appointments, etc.  Non-urgent messages can be sent to your provider as well.   To learn more about what you can do with MyChart, go to [email or web address].    Your next  appointment:   12 month(s)  The format for your next appointment:   In Person  Provider:   Dr. Sallyanne Kuster

## 2022-05-01 DIAGNOSIS — R52 Pain, unspecified: Secondary | ICD-10-CM | POA: Diagnosis not present

## 2022-05-01 DIAGNOSIS — Z992 Dependence on renal dialysis: Secondary | ICD-10-CM | POA: Diagnosis not present

## 2022-05-01 DIAGNOSIS — N2581 Secondary hyperparathyroidism of renal origin: Secondary | ICD-10-CM | POA: Diagnosis not present

## 2022-05-01 DIAGNOSIS — N186 End stage renal disease: Secondary | ICD-10-CM | POA: Diagnosis not present

## 2022-05-01 DIAGNOSIS — R519 Headache, unspecified: Secondary | ICD-10-CM | POA: Diagnosis not present

## 2022-05-04 DIAGNOSIS — Z992 Dependence on renal dialysis: Secondary | ICD-10-CM | POA: Diagnosis not present

## 2022-05-04 DIAGNOSIS — N2581 Secondary hyperparathyroidism of renal origin: Secondary | ICD-10-CM | POA: Diagnosis not present

## 2022-05-04 DIAGNOSIS — N186 End stage renal disease: Secondary | ICD-10-CM | POA: Diagnosis not present

## 2022-05-04 DIAGNOSIS — R52 Pain, unspecified: Secondary | ICD-10-CM | POA: Diagnosis not present

## 2022-05-04 DIAGNOSIS — R519 Headache, unspecified: Secondary | ICD-10-CM | POA: Diagnosis not present

## 2022-05-06 DIAGNOSIS — N186 End stage renal disease: Secondary | ICD-10-CM | POA: Diagnosis not present

## 2022-05-06 DIAGNOSIS — R52 Pain, unspecified: Secondary | ICD-10-CM | POA: Diagnosis not present

## 2022-05-06 DIAGNOSIS — Z992 Dependence on renal dialysis: Secondary | ICD-10-CM | POA: Diagnosis not present

## 2022-05-06 DIAGNOSIS — N2581 Secondary hyperparathyroidism of renal origin: Secondary | ICD-10-CM | POA: Diagnosis not present

## 2022-05-06 DIAGNOSIS — R519 Headache, unspecified: Secondary | ICD-10-CM | POA: Diagnosis not present

## 2022-05-08 DIAGNOSIS — R52 Pain, unspecified: Secondary | ICD-10-CM | POA: Diagnosis not present

## 2022-05-08 DIAGNOSIS — N2581 Secondary hyperparathyroidism of renal origin: Secondary | ICD-10-CM | POA: Diagnosis not present

## 2022-05-08 DIAGNOSIS — R519 Headache, unspecified: Secondary | ICD-10-CM | POA: Diagnosis not present

## 2022-05-08 DIAGNOSIS — N186 End stage renal disease: Secondary | ICD-10-CM | POA: Diagnosis not present

## 2022-05-08 DIAGNOSIS — Z992 Dependence on renal dialysis: Secondary | ICD-10-CM | POA: Diagnosis not present

## 2022-05-11 ENCOUNTER — Other Ambulatory Visit (HOSPITAL_COMMUNITY): Payer: Self-pay

## 2022-05-11 DIAGNOSIS — N2581 Secondary hyperparathyroidism of renal origin: Secondary | ICD-10-CM | POA: Diagnosis not present

## 2022-05-11 DIAGNOSIS — Z992 Dependence on renal dialysis: Secondary | ICD-10-CM | POA: Diagnosis not present

## 2022-05-11 DIAGNOSIS — R52 Pain, unspecified: Secondary | ICD-10-CM | POA: Diagnosis not present

## 2022-05-11 DIAGNOSIS — R519 Headache, unspecified: Secondary | ICD-10-CM | POA: Diagnosis not present

## 2022-05-11 DIAGNOSIS — N186 End stage renal disease: Secondary | ICD-10-CM | POA: Diagnosis not present

## 2022-05-11 MED ORDER — OXYCODONE HCL 15 MG PO TABS
15.0000 mg | ORAL_TABLET | Freq: Four times a day (QID) | ORAL | 0 refills | Status: DC | PRN
  Filled 2022-05-11: qty 113, 29d supply, fill #0
  Filled 2022-05-11: qty 7, 1d supply, fill #0

## 2022-05-12 DIAGNOSIS — N186 End stage renal disease: Secondary | ICD-10-CM | POA: Diagnosis not present

## 2022-05-12 DIAGNOSIS — Z992 Dependence on renal dialysis: Secondary | ICD-10-CM | POA: Diagnosis not present

## 2022-05-12 DIAGNOSIS — E1022 Type 1 diabetes mellitus with diabetic chronic kidney disease: Secondary | ICD-10-CM | POA: Diagnosis not present

## 2022-05-13 ENCOUNTER — Other Ambulatory Visit: Payer: Self-pay | Admitting: *Deleted

## 2022-05-13 ENCOUNTER — Other Ambulatory Visit (HOSPITAL_COMMUNITY): Payer: Self-pay

## 2022-05-13 DIAGNOSIS — R52 Pain, unspecified: Secondary | ICD-10-CM | POA: Diagnosis not present

## 2022-05-13 DIAGNOSIS — N2581 Secondary hyperparathyroidism of renal origin: Secondary | ICD-10-CM | POA: Diagnosis not present

## 2022-05-13 DIAGNOSIS — R519 Headache, unspecified: Secondary | ICD-10-CM | POA: Diagnosis not present

## 2022-05-13 DIAGNOSIS — G8929 Other chronic pain: Secondary | ICD-10-CM | POA: Diagnosis not present

## 2022-05-13 DIAGNOSIS — N186 End stage renal disease: Secondary | ICD-10-CM | POA: Diagnosis not present

## 2022-05-13 DIAGNOSIS — Z992 Dependence on renal dialysis: Secondary | ICD-10-CM | POA: Diagnosis not present

## 2022-05-13 MED ORDER — LIDOCAINE 5 % EX PTCH: 2.0000 | MEDICATED_PATCH | CUTANEOUS | 1 refills | Status: DC

## 2022-05-14 ENCOUNTER — Ambulatory Visit
Admission: RE | Admit: 2022-05-14 | Discharge: 2022-05-14 | Disposition: A | Discharge status: Home / Self Care | Payer: Medicare Other | Source: Ambulatory Visit | Attending: Obstetrics and Gynecology | Admitting: Obstetrics and Gynecology

## 2022-05-14 DIAGNOSIS — Z1231 Encounter for screening mammogram for malignant neoplasm of breast: Secondary | ICD-10-CM

## 2022-05-14 DIAGNOSIS — E785 Hyperlipidemia, unspecified: Secondary | ICD-10-CM | POA: Diagnosis not present

## 2022-05-14 LAB — LIPID PANEL
Chol/HDL Ratio: 3.8 ratio (ref 0.0–4.4)
Cholesterol, Total: 211 mg/dL — ABNORMAL HIGH (ref 100–199)
HDL: 55 mg/dL (ref >39)
LDL Chol Calc (NIH): 135 mg/dL — ABNORMAL HIGH (ref 0–99)
Triglycerides: 118 mg/dL (ref 0–149)
VLDL Cholesterol Cal: 21 mg/dL (ref 5–40)

## 2022-05-15 DIAGNOSIS — R519 Headache, unspecified: Secondary | ICD-10-CM | POA: Diagnosis not present

## 2022-05-15 DIAGNOSIS — Z992 Dependence on renal dialysis: Secondary | ICD-10-CM | POA: Diagnosis not present

## 2022-05-15 DIAGNOSIS — G8929 Other chronic pain: Secondary | ICD-10-CM | POA: Diagnosis not present

## 2022-05-15 DIAGNOSIS — N2581 Secondary hyperparathyroidism of renal origin: Secondary | ICD-10-CM | POA: Diagnosis not present

## 2022-05-15 DIAGNOSIS — N186 End stage renal disease: Secondary | ICD-10-CM | POA: Diagnosis not present

## 2022-05-15 DIAGNOSIS — R52 Pain, unspecified: Secondary | ICD-10-CM | POA: Diagnosis not present

## 2022-05-17 ENCOUNTER — Telehealth: Payer: Medicare Other

## 2022-05-18 ENCOUNTER — Other Ambulatory Visit: Payer: Self-pay | Admitting: Obstetrics and Gynecology

## 2022-05-18 DIAGNOSIS — R519 Headache, unspecified: Secondary | ICD-10-CM | POA: Diagnosis not present

## 2022-05-18 DIAGNOSIS — R52 Pain, unspecified: Secondary | ICD-10-CM | POA: Diagnosis not present

## 2022-05-18 DIAGNOSIS — N186 End stage renal disease: Secondary | ICD-10-CM | POA: Diagnosis not present

## 2022-05-18 DIAGNOSIS — R928 Other abnormal and inconclusive findings on diagnostic imaging of breast: Secondary | ICD-10-CM

## 2022-05-18 DIAGNOSIS — G8929 Other chronic pain: Secondary | ICD-10-CM | POA: Diagnosis not present

## 2022-05-18 DIAGNOSIS — N2581 Secondary hyperparathyroidism of renal origin: Secondary | ICD-10-CM | POA: Diagnosis not present

## 2022-05-18 DIAGNOSIS — Z992 Dependence on renal dialysis: Secondary | ICD-10-CM | POA: Diagnosis not present

## 2022-05-20 DIAGNOSIS — G8929 Other chronic pain: Secondary | ICD-10-CM | POA: Diagnosis not present

## 2022-05-20 DIAGNOSIS — R519 Headache, unspecified: Secondary | ICD-10-CM | POA: Diagnosis not present

## 2022-05-20 DIAGNOSIS — N2581 Secondary hyperparathyroidism of renal origin: Secondary | ICD-10-CM | POA: Diagnosis not present

## 2022-05-20 DIAGNOSIS — R52 Pain, unspecified: Secondary | ICD-10-CM | POA: Diagnosis not present

## 2022-05-20 DIAGNOSIS — N186 End stage renal disease: Secondary | ICD-10-CM | POA: Diagnosis not present

## 2022-05-20 DIAGNOSIS — Z992 Dependence on renal dialysis: Secondary | ICD-10-CM | POA: Diagnosis not present

## 2022-05-22 DIAGNOSIS — Z992 Dependence on renal dialysis: Secondary | ICD-10-CM | POA: Diagnosis not present

## 2022-05-22 DIAGNOSIS — N2581 Secondary hyperparathyroidism of renal origin: Secondary | ICD-10-CM | POA: Diagnosis not present

## 2022-05-22 DIAGNOSIS — R52 Pain, unspecified: Secondary | ICD-10-CM | POA: Diagnosis not present

## 2022-05-22 DIAGNOSIS — G8929 Other chronic pain: Secondary | ICD-10-CM | POA: Diagnosis not present

## 2022-05-22 DIAGNOSIS — R519 Headache, unspecified: Secondary | ICD-10-CM | POA: Diagnosis not present

## 2022-05-22 DIAGNOSIS — N186 End stage renal disease: Secondary | ICD-10-CM | POA: Diagnosis not present

## 2022-05-25 ENCOUNTER — Other Ambulatory Visit: Payer: Medicare Other

## 2022-05-25 ENCOUNTER — Inpatient Hospital Stay: Admission: RE | Admit: 2022-05-25 | Payer: Medicare Other | Source: Ambulatory Visit

## 2022-05-25 DIAGNOSIS — N186 End stage renal disease: Secondary | ICD-10-CM | POA: Diagnosis not present

## 2022-05-25 DIAGNOSIS — R52 Pain, unspecified: Secondary | ICD-10-CM | POA: Diagnosis not present

## 2022-05-25 DIAGNOSIS — N2581 Secondary hyperparathyroidism of renal origin: Secondary | ICD-10-CM | POA: Diagnosis not present

## 2022-05-25 DIAGNOSIS — G8929 Other chronic pain: Secondary | ICD-10-CM | POA: Diagnosis not present

## 2022-05-25 DIAGNOSIS — R519 Headache, unspecified: Secondary | ICD-10-CM | POA: Diagnosis not present

## 2022-05-25 DIAGNOSIS — Z992 Dependence on renal dialysis: Secondary | ICD-10-CM | POA: Diagnosis not present

## 2022-05-27 DIAGNOSIS — R519 Headache, unspecified: Secondary | ICD-10-CM | POA: Diagnosis not present

## 2022-05-27 DIAGNOSIS — G8929 Other chronic pain: Secondary | ICD-10-CM | POA: Diagnosis not present

## 2022-05-27 DIAGNOSIS — Z992 Dependence on renal dialysis: Secondary | ICD-10-CM | POA: Diagnosis not present

## 2022-05-27 DIAGNOSIS — R52 Pain, unspecified: Secondary | ICD-10-CM | POA: Diagnosis not present

## 2022-05-27 DIAGNOSIS — N186 End stage renal disease: Secondary | ICD-10-CM | POA: Diagnosis not present

## 2022-05-27 DIAGNOSIS — N2581 Secondary hyperparathyroidism of renal origin: Secondary | ICD-10-CM | POA: Diagnosis not present

## 2022-05-27 NOTE — Patient Instructions (Signed)
DUE TO COVID-19 ONLY TWO VISITORS  (aged 70 and older)  ARE ALLOWED TO COME WITH YOU AND STAY IN THE WAITING ROOM ONLY DURING PRE OP AND PROCEDURE.   **NO VISITORS ARE ALLOWED IN THE SHORT STAY AREA OR RECOVERY ROOM!!**  IF YOU WILL BE ADMITTED INTO THE HOSPITAL YOU ARE ALLOWED ONLY FOUR SUPPORT PEOPLE DURING VISITATION HOURS ONLY (7 AM -8PM)   The support person(s) must pass our screening, gel in and out, and wear a mask at all times, including in the patient's room. Patients must also wear a mask when staff or their support person are in the room. Visitors GUEST BADGE MUST BE WORN VISIBLY  One adult visitor may remain with you overnight and MUST be in the room by 8 P.M.     Your procedure is scheduled on: 06/03/22   Report to [facility] Main Entrance    Report to Short Stay at : 5:15 AM   Call this number if you have problems the morning of surgery [phone]   Do not eat food :After Midnight.   After Midnight you may have the following liquids until : 4:30 AM DAY OF SURGERY  Water Black Coffee (sugar ok, NO MILK/CREAM OR CREAMERS)  Tea (sugar ok, NO MILK/CREAM OR CREAMERS) regular and decaf                             Plain Jell-O (NO RED)                                           Fruit ices (not with fruit pulp, NO RED)                                     Popsicles (NO RED)                                                                  Juice: apple, WHITE grape, WHITE cranberry Sports drinks like Gatorade (NO RED) Clear broth(vegetable,chicken,beef)  Drink G2 drink AT : 4:30 AM the day of surgery.       The day of surgery:  Drink ONE (1) Pre-Surgery Clear Ensure or G2 at AM the morning of surgery. Drink in one sitting. Do not sip.  This drink was given to you during your hospital  pre-op appointment visit. Nothing else to drink after completing the  Pre-Surgery Clear Ensure or G2.          If you have questions, please contact your surgeon's office.     Oral Hygiene is also important to reduce your risk of infection.                                    Remember - BRUSH YOUR TEETH THE MORNING OF SURGERY WITH YOUR REGULAR TOOTHPASTE   Do NOT smoke after Midnight   Take these medicines the morning of surgery with A SIP OF WATER:bupropion,fluoxetine,pantoprazole.Tylenol and lorazepam as needed.  How to Manage Your Diabetes Before and After Surgery  Why is it important to control my blood sugar before and after surgery? Improving blood sugar levels before and after surgery helps healing and can limit problems. A way of improving blood sugar control is eating a healthy diet by:  Eating less sugar and carbohydrates  Increasing activity/exercise  Talking with your doctor about reaching your blood sugar goals High blood sugars (greater than 180 mg/dL) can raise your risk of infections and slow your recovery, so you will need to focus on controlling your diabetes during the weeks before surgery. Make sure that the doctor who takes care of your diabetes knows about your planned surgery including the date and location.  How do I manage my blood sugar before surgery? Check your blood sugar at least 4 times a day, starting 2 days before surgery, to make sure that the level is not too high or low. Check your blood sugar the morning of your surgery when you wake up and every 2 hours until you get to the Short Stay unit. If your blood sugar is less than 70 mg/dL, you will need to treat for low blood sugar: Do not take insulin. Treat a low blood sugar (less than 70 mg/dL) with  cup of clear juice (cranberry or apple), 4 glucose tablets, OR glucose gel. Recheck blood sugar in 15 minutes after treatment (to make sure it is greater than 70 mg/dL). If your blood sugar is not greater than 70 mg/dL on recheck, call [phone] for further instructions. Report your blood sugar to the short stay nurse when you get to Short Stay.  If you are admitted to the  hospital after surgery: Your blood sugar will be checked by the staff and you will probably be given insulin after surgery (instead of oral diabetes medicines) to make sure you have good blood sugar levels. The goal for blood sugar control after surgery is 80-180 mg/dL.   WHAT DO I DO ABOUT MY DIABETES MEDICATION?  Do not take oral diabetes medicines (pills) the morning of surgery.  THE DAY BEFORE SURGERY, take actos as usual      THE MORNING OF SURGERY,  DO NOT TAKE ANY ORAL DIABETIC MEDICATIONS DAY OF YOUR SURGERY  Bring CPAP mask and tubing day of surgery.                              You may not have any metal on your body including hair pins, jewelry, and body piercing             Do not wear make-up, lotions, powders, perfumes/cologne, or deodorant  Do not wear nail polish including gel and S&S, artificial/acrylic nails, or any other type of covering on natural nails including finger and toenails. If you have artificial nails, gel coating, etc. that needs to be removed by a nail salon please have this removed prior to surgery or surgery may need to be canceled/ delayed if the surgeon/ anesthesia feels like they are unable to be safely monitored.   Do not shave  48 hours prior to surgery.    Do not bring valuables to the hospital. [place].   Contacts, dentures or bridgework may not be worn into surgery.   Bring small overnight bag day of surgery.   DO NOT BRING YOUR  [place]. PHARMACY WILL DISPENSE MEDICATIONS LISTED ON YOUR MEDICATION LIST TO YOU DURING YOUR ADMISSION [place]!    Patients discharged on the day of surgery will not be allowed to drive home.  Someone NEEDS to stay with you for the first 24 hours after anesthesia.   Special Instructions: Bring a copy of your healthcare power of attorney and living will documents         the day of surgery if you haven't scanned them before.               Please read over the following fact sheets you were given: IF YOU HAVE QUESTIONS ABOUT YOUR PRE-OP INSTRUCTIONS PLEASE CALL [phone]     [facility] - Preparing for Surgery Before surgery, you can play an important role.  Because skin is not sterile, your skin needs to be as free of germs as possible.  You can reduce the number of germs on your skin by washing with CHG (chlorahexidine gluconate) soap before surgery.  CHG is an antiseptic cleaner which kills germs and bonds with the skin to continue killing germs even after washing. Please DO NOT use if you have an allergy to CHG or antibacterial soaps.  If your skin becomes reddened/irritated stop using the CHG and inform your nurse when you arrive at Short Stay. Do not shave (including legs and underarms) for at least 48 hours prior to the first CHG shower.  You may shave your face/neck. Please follow these instructions carefully:  1.  Shower with CHG Soap the night before surgery and the  morning of Surgery.  2.  If you choose to wash your hair, wash your hair first as usual with your  normal  shampoo.  3.  After you shampoo, rinse your hair and body thoroughly to remove the  shampoo.                           4.  Use CHG as you would any other liquid soap.  You can apply chg directly  to the skin and wash                       Gently with a scrungie or clean washcloth.  5.  Apply the CHG Soap to your body ONLY FROM THE NECK DOWN.   Do not use on face/ open                           Wound or open sores. Avoid contact with eyes, ears mouth and genitals (private parts).                       Wash face,  Genitals (private parts) with your normal soap.             6.  Wash thoroughly, paying special attention to the area where your surgery  will be performed.  7.  Thoroughly rinse your body with warm water from the neck down.  8.  DO NOT shower/wash with your normal soap after using and rinsing off  the CHG Soap.                9.  Pat  yourself dry with a clean towel.            10.  Wear clean pajamas.  11.  Place clean sheets on your bed the night of your first shower and do not  sleep with pets. Day of Surgery : Do not apply any lotions/deodorants the morning of surgery.  Please wear clean clothes to the hospital/surgery center.  FAILURE TO FOLLOW THESE INSTRUCTIONS MAY RESULT IN THE CANCELLATION OF YOUR SURGERY PATIENT SIGNATURE_________________________________  NURSE SIGNATURE__________________________________  ________________________________________________________________________ [facility]- Preparing for Total Shoulder Arthroplasty    Before surgery, you can play an important role. Because skin is not sterile, your skin needs to be as free of germs as possible. You can reduce the number of germs on your skin by using the following products. Benzoyl Peroxide Gel Reduces the number of germs present on the skin Applied twice a day to shoulder area starting two days before surgery    ==================================================================  Please follow these instructions carefully:  BENZOYL PEROXIDE 5% GEL  Please do not use if you have an allergy to benzoyl peroxide.   If your skin becomes reddened/irritated stop using the benzoyl peroxide.  Starting two days before surgery, apply as follows: Apply benzoyl peroxide in the morning and at night. Apply after taking a shower. If you are not taking a shower clean entire shoulder front, back, and side along with the armpit with a clean wet washcloth.  Place a quarter-sized dollop on your shoulder and rub in thoroughly, making sure to cover the front, back, and side of your shoulder, along with the armpit.   2 days before ____ AM   ____ PM              1 day before ____ AM   ____ PM                         Do this twice a day for two days.  (Last application is the night before surgery, AFTER using the CHG soap as described below).  Do NOT  apply benzoyl peroxide gel on the day of surgery.   Incentive Spirometer  An incentive spirometer is a tool that can help keep your lungs clear and active. This tool measures how well you are filling your lungs with each breath. Taking long deep breaths may help reverse or decrease the chance of developing breathing (pulmonary) problems (especially infection) following: A long period of time when you are unable to move or be active. BEFORE THE PROCEDURE  If the spirometer includes an indicator to show your best effort, your nurse or respiratory therapist will set it to a desired goal. If possible, sit up straight or lean slightly forward. Try not to slouch. Hold the incentive spirometer in an upright position. INSTRUCTIONS FOR USE  Sit on the edge of your bed if possible, or sit up as far as you can in bed or on a chair. Hold the incentive spirometer in an upright position. Breathe out normally. Place the mouthpiece in your mouth and seal your lips tightly around it. Breathe in slowly and as deeply as possible, raising the piston or the ball toward the top of the column. Hold your breath for 3-5 seconds or for as long as possible. Allow the piston or ball to fall to the bottom of the column. Remove the mouthpiece from your mouth and breathe out normally. Rest for a few seconds and repeat Steps 1 through 7 at least 10 times every 1-2 hours when you are awake. Take your time and take a few normal breaths between deep breaths. The  spirometer may include an indicator to show your best effort. Use the indicator as a goal to work toward during each repetition. After each set of 10 deep breaths, practice coughing to be sure your lungs are clear. If you have an incision (the cut made at the time of surgery), support your incision when coughing by placing a pillow or rolled up towels firmly against it. Once you are able to get out of bed, walk around indoors and cough well. You may stop using the  incentive spirometer when instructed by your caregiver.  RISKS AND COMPLICATIONS Take your time so you do not get dizzy or light-headed. If you are in pain, you may need to take or ask for pain medication before doing incentive spirometry. It is harder to take a deep breath if you are having pain. AFTER USE Rest and breathe slowly and easily. It can be helpful to keep track of a log of your progress. Your caregiver can provide you with a simple table to help with this. If you are using the spirometer at home, follow these instructions: [place] IF:  You are having difficultly using the spirometer. You have trouble using the spirometer as often as instructed. Your pain medication is not giving enough relief while using the spirometer. You develop fever of 100.5 F (38.1 C) or higher. SEEK IMMEDIATE MEDICAL CARE IF:  You cough up bloody sputum that had not been present before. You develop fever of 102 F (38.9 C) or greater. You develop worsening pain at or near the incision site. MAKE SURE YOU:  Understand these instructions. Will watch your condition. Will get help right away if you are not doing well or get worse. Document Released: 04/11/2007 Document Revised: 02/21/2012 Document Reviewed: 06/12/2007 [facility] Patient Information 2014 [place], [place].   ________________________________________________________________________

## 2022-05-28 ENCOUNTER — Other Ambulatory Visit: Payer: Self-pay

## 2022-05-28 ENCOUNTER — Encounter (HOSPITAL_COMMUNITY)
Admission: RE | Admit: 2022-05-28 | Discharge: 2022-05-28 | Disposition: A | Discharge status: Home / Self Care | Payer: Medicare Other | Source: Ambulatory Visit | Attending: Orthopedic Surgery | Admitting: Orthopedic Surgery

## 2022-05-28 ENCOUNTER — Encounter (HOSPITAL_COMMUNITY): Payer: Self-pay

## 2022-05-28 DIAGNOSIS — Z992 Dependence on renal dialysis: Secondary | ICD-10-CM | POA: Insufficient documentation

## 2022-05-28 DIAGNOSIS — Z01818 Encounter for other preprocedural examination: Secondary | ICD-10-CM

## 2022-05-28 DIAGNOSIS — Z01812 Encounter for preprocedural laboratory examination: Secondary | ICD-10-CM | POA: Diagnosis not present

## 2022-05-28 DIAGNOSIS — E1122 Type 2 diabetes mellitus with diabetic chronic kidney disease: Secondary | ICD-10-CM | POA: Diagnosis not present

## 2022-05-28 DIAGNOSIS — M75101 Unspecified rotator cuff tear or rupture of right shoulder, not specified as traumatic: Secondary | ICD-10-CM | POA: Insufficient documentation

## 2022-05-28 DIAGNOSIS — E1129 Type 2 diabetes mellitus with other diabetic kidney complication: Secondary | ICD-10-CM

## 2022-05-28 DIAGNOSIS — N186 End stage renal disease: Secondary | ICD-10-CM | POA: Diagnosis not present

## 2022-05-28 DIAGNOSIS — I120 Hypertensive chronic kidney disease with stage 5 chronic kidney disease or end stage renal disease: Secondary | ICD-10-CM | POA: Insufficient documentation

## 2022-05-28 DIAGNOSIS — Z87891 Personal history of nicotine dependence: Secondary | ICD-10-CM | POA: Insufficient documentation

## 2022-05-28 DIAGNOSIS — K219 Gastro-esophageal reflux disease without esophagitis: Secondary | ICD-10-CM | POA: Insufficient documentation

## 2022-05-28 NOTE — Progress Notes (Signed)
For Short Stay: [facility] appointment date: Date of COVID positive in last 47 days:  Bowel Prep reminder:   For Anesthesia: PCP - Dr. Lew Dawes Cardiologist - Dr. Dani Gobble Croitoru. LOV: 04/29/22  Chest x-ray - 03/10/22 EKG - 04/29/22 Stress Test -  ECHO - 12/09/21 Cardiac Cath -  Pacemaker/ICD device last checked: Pacemaker orders received: Device Rep notified:  Spinal Cord Stimulator:  Sleep Study -  CPAP -   Fasting Blood Sugar - 100 - 120 Checks Blood Sugar __2___ times a day Date and result of last Hgb A1c- 6.3: 03/10/22  Blood Thinner Instructions: Aspirin Instructions: Last Dose:  Activity level: Can go up a flight of stairs and activities of daily living without stopping and without chest pain and/or shortness of breath   Able to exercise without chest pain and/or shortness of breath   Unable to go up a flight of stairs without chest pain and/or shortness of breath     Anesthesia review: Hx: ESRD(HD T_ T_ S),DIA,HTN  Patient denies shortness of breath, fever, cough and chest pain at PAT appointment   Patient verbalized understanding of instructions that were given to them at the PAT appointment. Patient was also instructed that they will need to review over the PAT instructions again at home before surgery.

## 2022-05-29 DIAGNOSIS — G8929 Other chronic pain: Secondary | ICD-10-CM | POA: Diagnosis not present

## 2022-05-29 DIAGNOSIS — Z992 Dependence on renal dialysis: Secondary | ICD-10-CM | POA: Diagnosis not present

## 2022-05-29 DIAGNOSIS — R519 Headache, unspecified: Secondary | ICD-10-CM | POA: Diagnosis not present

## 2022-05-29 DIAGNOSIS — N2581 Secondary hyperparathyroidism of renal origin: Secondary | ICD-10-CM | POA: Diagnosis not present

## 2022-05-29 DIAGNOSIS — R52 Pain, unspecified: Secondary | ICD-10-CM | POA: Diagnosis not present

## 2022-05-29 DIAGNOSIS — N186 End stage renal disease: Secondary | ICD-10-CM | POA: Diagnosis not present

## 2022-05-31 ENCOUNTER — Telehealth: Payer: Medicare Other

## 2022-05-31 ENCOUNTER — Other Ambulatory Visit: Payer: Self-pay | Admitting: Obstetrics and Gynecology

## 2022-05-31 ENCOUNTER — Ambulatory Visit
Admission: RE | Admit: 2022-05-31 | Discharge: 2022-05-31 | Disposition: A | Discharge status: Home / Self Care | Payer: Medicare Other | Source: Ambulatory Visit | Attending: Obstetrics and Gynecology | Admitting: Obstetrics and Gynecology

## 2022-05-31 DIAGNOSIS — N6310 Unspecified lump in the right breast, unspecified quadrant: Secondary | ICD-10-CM | POA: Diagnosis not present

## 2022-05-31 DIAGNOSIS — R928 Other abnormal and inconclusive findings on diagnostic imaging of breast: Secondary | ICD-10-CM

## 2022-05-31 NOTE — Progress Notes (Addendum)
Anesthesia Chart Review   Case: [id number] Date/Time: 06/03/22 0715   Procedure: REVERSE SHOULDER ARTHROPLASTY (Right: Shoulder) - 180min   Anesthesia type: General   Pre-op diagnosis: Right shoulder rotator cuff tear arthropathy   Location: [facility] ROOM 06 / [facility]   Surgeons: Justice Britain, MD       DISCUSSION:71 y.o. former smoker with h/o GERD, HTN, DM II, ESRD (dialysis T/Th/Sat), right shoulder rotator cuff tear scheduled for above procedure 06/03/2022 with Dr. Justice Britain.   Pt cleared by nephrology for upcoming procedure.   Discussed with Dr. Doroteo Glassman.  Ideal timeframe from dialysis to surgery is 24 hours. If unable to coordinate will proceed two days out from dialysis as long as potassium wnl. Discussed with Dr. Susie Cassette office.   Addendum:  Pt will have dialysis Tuesday, Wednesday, and Saturday the week of surgery.  Labs will be done DOS.  VS: There were no vitals taken for this visit.  PROVIDERS: Plotnikov, Evie Lacks, MD is PCP    LABS:  labs DOS (all labs ordered are listed, but only abnormal results are displayed)  Labs Reviewed - No data to display   IMAGES:   EKG: 04/29/2022 Rate 78 bpm  NSR Prolonged QT  CV: Echo 12/09/2021 1. Left ventricular ejection fraction, by estimation, is 60 to 65%. The  left ventricle has normal function. The left ventricle has no regional  wall motion abnormalities. Left ventricular diastolic parameters were  normal.   2. Right ventricular systolic function is normal. The right ventricular  size is normal.   3. The mitral valve is normal in structure. No evidence of mitral valve  regurgitation. No evidence of mitral stenosis.   4. The aortic valve is normal in structure. Aortic valve regurgitation is  not visualized. Aortic valve sclerosis is present, with no evidence of  aortic valve stenosis.   5. The inferior vena cava is normal in size with greater than 50%  respiratory variability, suggesting right atrial pressure of 3  mmHg. Past Medical History:  Diagnosis Date   Anemia    Anxiety    Brain tumor (benign) ([place])    Colitis 2010   microscopic- Dr Henrene Pastor   Depression    Diabetes mellitus    type II   Dyspnea    with exertion   ESRD (end stage renal disease) ([place])    TTUSAT [address]    Fibromyalgia    GERD (gastroesophageal reflux disease)    Headache    History of blood transfusion    after knee surgery   Hypertension    discontinued all diuretics and antihypertensives   IBS (irritable bowel syndrome)    LBP (low back pain)    Neuropathy    feet bilat    Osteoarthritis    Osteopenia    Pneumonia    hx of 2014    Rotator cuff tear, right    Sinusitis    currently being treated with antibiotic will complete 03/04/2015    Past Surgical History:  Procedure Laterality Date   AV FISTULA PLACEMENT Right 09/12/2020   Procedure: RIGHT ARTERIOVENOUS (AV) FISTULA CREATION;  Surgeon: Elam Dutch, MD;  Location: [place];  Service: Vascular;  Laterality: Right;   [place] Right 10/27/2020   Procedure: RIGHT UPPER EXTREMITY SECOND STAGE [place];  Surgeon: Elam Dutch, MD;  Location: [place];  Service: Vascular;  Laterality: Right;   BIOPSY  03/11/2022   Procedure: BIOPSY;  Surgeon: Irving Copas., MD;  [facility];  Service: Gastroenterology;;   COLONOSCOPY WITH PROPOFOL N/A 03/11/2022   Procedure: COLONOSCOPY WITH PROPOFOL;  Surgeon: Rush Landmark Telford Nab., MD;  Location: [place];  Service: Gastroenterology;  Laterality: N/A;   foramen magnum ependymoma surgery  2003   Dr Rita Ohara   JOINT REPLACEMENT Bilateral    NASAL SINUS SURGERY     1973    TONSILLECTOMY     TOTAL KNEE ARTHROPLASTY     L 2008, R 2009, R 2016- Dr Maureen Ralphs   TOTAL KNEE REVISION Right 03/05/2015   Procedure: RIGHT TOTAL KNEE ARTHROPLASTY REVISION;  Surgeon: Gaynelle Arabian, MD;  Location[facility];  Service: Orthopedics;  Laterality: Right;    MEDICATIONS:   Accu-Chek Softclix Lancets lancets   acetaminophen (TYLENOL) 500 MG tablet   acidophilus (RISAQUAD) CAPS capsule   aspirin-acetaminophen-caffeine (EXCEDRIN MIGRAINE) 250-250-65 MG tablet   B Complex-C-Folic Acid (RENA-VITE PO)   blood glucose meter kit and supplies   Blood Glucose Monitoring Suppl (ACCU-CHEK GUIDE ME) w/Device KIT   buPROPion (WELLBUTRIN XL) 300 MG 24 hr tablet   carboxymethylcellulose (REFRESH PLUS) 0.5 % SOLN   cyclobenzaprine (FLEXERIL) 10 MG tablet   ferric citrate (AURYXIA) 1 GM 210 MG(Fe) tablet   FLUoxetine (PROZAC) 20 MG capsule   glucose blood (ACCU-CHEK GUIDE) test strip   glucose blood (TRUETEST TEST) test strip   ketoconazole (NIZORAL) 2 % cream   lidocaine (LIDODERM) 5 %   lidocaine (XYLOCAINE) 5 % ointment   lidocaine-prilocaine (EMLA) cream   LORazepam (ATIVAN) 2 MG tablet   midodrine (PROAMATINE) 5 MG tablet   multivitamin-lutein (OCUVITE-LUTEIN) CAPS capsule   naloxone (NARCAN) nasal spray 4 mg/0.1 mL   oxyCODONE (ROXICODONE) 15 MG immediate release tablet   pantoprazole (PROTONIX) 40 MG tablet   pilocarpine (SALAGEN) 5 MG tablet   pioglitazone (ACTOS) 15 MG tablet   polyethylene glycol (MIRALAX / GLYCOLAX) 17 g packet   PREVIDENT 5000 DRY MOUTH 1.1 % GEL dental gel   No current facility-administered medications for this encounter.    Konrad Felix Ward, PA-C [facility]Surgical [facility] [phone]

## 2022-05-31 NOTE — Anesthesia Preprocedure Evaluation (Addendum)
Anesthesia Evaluation  Patient identified by MRN, date of birth, ID band Patient awake    Reviewed: Allergy & Precautions, NPO status , Patient's Chart, lab work & pertinent test results  Airway Mallampati: II  TM Distance: >3 FB Neck ROM: Full    Dental  (+) Teeth Intact, Dental Advisory Given   Pulmonary former smoker,    Pulmonary exam normal breath sounds clear to auscultation       Cardiovascular hypertension, Normal cardiovascular exam Rhythm:Regular Rate:Normal     Neuro/Psych  Headaches, PSYCHIATRIC DISORDERS Anxiety Depression    GI/Hepatic Neg liver ROS, GERD  Medicated,  Endo/Other  negative endocrine ROSdiabetes, Type 2, Oral Hypoglycemic Agents  Renal/GU ESRF and DialysisRenal disease (TTHSAT)     Musculoskeletal  (+) Arthritis , Fibromyalgia -Right shoulder rotator cuff tear arthropathy   Abdominal   Peds  Hematology  (+) Blood dyscrasia, anemia ,   Anesthesia Other Findings Day of surgery medications reviewed with the patient.  Reproductive/Obstetrics                            Anesthesia Physical Anesthesia Plan  ASA: 3  Anesthesia Plan: General   Post-op Pain Management: Tylenol PO (pre-op)* and Regional block*   Induction: Intravenous  PONV Risk Score and Plan: 3 and Dexamethasone and Ondansetron  Airway Management Planned: Oral ETT  Additional Equipment:   Intra-op Plan:   Post-operative Plan: Extubation in OR  Informed Consent: I have reviewed the patients History and Physical, chart, labs and discussed the procedure including the risks, benefits and alternatives for the proposed anesthesia with the patient or authorized representative who has indicated his/her understanding and acceptance.     Dental advisory given  Plan Discussed with: CRNA  Anesthesia Plan Comments: (See PAT note 05/28/2022)       Anesthesia Quick Evaluation

## 2022-06-01 DIAGNOSIS — R519 Headache, unspecified: Secondary | ICD-10-CM | POA: Diagnosis not present

## 2022-06-01 DIAGNOSIS — G8929 Other chronic pain: Secondary | ICD-10-CM | POA: Diagnosis not present

## 2022-06-01 DIAGNOSIS — N186 End stage renal disease: Secondary | ICD-10-CM | POA: Diagnosis not present

## 2022-06-01 DIAGNOSIS — Z992 Dependence on renal dialysis: Secondary | ICD-10-CM | POA: Diagnosis not present

## 2022-06-01 DIAGNOSIS — N2581 Secondary hyperparathyroidism of renal origin: Secondary | ICD-10-CM | POA: Diagnosis not present

## 2022-06-01 DIAGNOSIS — R52 Pain, unspecified: Secondary | ICD-10-CM | POA: Diagnosis not present

## 2022-06-02 ENCOUNTER — Encounter: Payer: Self-pay | Admitting: Internal Medicine

## 2022-06-02 ENCOUNTER — Ambulatory Visit (INDEPENDENT_AMBULATORY_CARE_PROVIDER_SITE_OTHER): Payer: Medicare Other | Admitting: Internal Medicine

## 2022-06-02 ENCOUNTER — Encounter (HOSPITAL_COMMUNITY): Payer: Self-pay | Admitting: Orthopedic Surgery

## 2022-06-02 ENCOUNTER — Other Ambulatory Visit (HOSPITAL_COMMUNITY): Payer: Self-pay

## 2022-06-02 VITALS — BP 118/60 | HR 84 | Temp 98.1°F | Ht 62.0 in | Wt 211.4 lb

## 2022-06-02 DIAGNOSIS — N186 End stage renal disease: Secondary | ICD-10-CM | POA: Diagnosis not present

## 2022-06-02 DIAGNOSIS — E669 Obesity, unspecified: Secondary | ICD-10-CM

## 2022-06-02 DIAGNOSIS — M25511 Pain in right shoulder: Secondary | ICD-10-CM

## 2022-06-02 DIAGNOSIS — I10 Essential (primary) hypertension: Secondary | ICD-10-CM

## 2022-06-02 DIAGNOSIS — N2581 Secondary hyperparathyroidism of renal origin: Secondary | ICD-10-CM | POA: Diagnosis not present

## 2022-06-02 DIAGNOSIS — R519 Headache, unspecified: Secondary | ICD-10-CM | POA: Diagnosis not present

## 2022-06-02 DIAGNOSIS — R52 Pain, unspecified: Secondary | ICD-10-CM | POA: Diagnosis not present

## 2022-06-02 DIAGNOSIS — E1129 Type 2 diabetes mellitus with other diabetic kidney complication: Secondary | ICD-10-CM

## 2022-06-02 DIAGNOSIS — G8929 Other chronic pain: Secondary | ICD-10-CM | POA: Diagnosis not present

## 2022-06-02 DIAGNOSIS — Z992 Dependence on renal dialysis: Secondary | ICD-10-CM | POA: Diagnosis not present

## 2022-06-02 MED ORDER — RYBELSUS 3 MG PO TABS
1.0000 | ORAL_TABLET | Freq: Every day | ORAL | 3 refills | Status: DC
  Filled 2022-06-02: qty 90, 90d supply, fill #0

## 2022-06-02 MED ORDER — GLIMEPIRIDE 1 MG PO TABS
1.0000 mg | ORAL_TABLET | Freq: Every day | ORAL | 3 refills | Status: DC
  Filled 2022-06-02: qty 90, 90d supply, fill #0

## 2022-06-02 NOTE — Progress Notes (Unsigned)
Patient ID: [name], female   DOB: 04/05/1951, 71 y.o.   MRN: [id number]       Chief Complaint: follow up HTN, HLD and hyperglycemia ***       HPI:  [name] is a 71 y.o. female here with c/o        Wt Readings from Last 3 Encounters:  06/02/22 211 lb 6.4 oz (95.9 kg)  04/29/22 212 lb 6.4 oz (96.3 kg)  04/21/22 217 lb 3.2 oz (98.5 kg)   BP Readings from Last 3 Encounters:  06/02/22 118/60  04/29/22 120/72  04/21/22 (!) 148/66         Past Medical History:  Diagnosis Date   Anemia    Anxiety    Brain tumor (benign) ([place])    Colitis 2010   microscopic- Dr Henrene Pastor   Depression    Diabetes mellitus    type II   Dyspnea    with exertion   ESRD (end stage renal disease) ([place])    TTUSAT [address]    Fibromyalgia    GERD (gastroesophageal reflux disease)    Headache    History of blood transfusion    after knee surgery   Hypertension    discontinued all diuretics and antihypertensives   IBS (irritable bowel syndrome)    LBP (low back pain)    Neuropathy    feet bilat    Osteoarthritis    Osteopenia    Pneumonia    hx of 2014    Rotator cuff tear, right    Sinusitis    currently being treated with antibiotic will complete 03/04/2015   Past Surgical History:  Procedure Laterality Date   AV FISTULA PLACEMENT Right 09/12/2020   Procedure: RIGHT ARTERIOVENOUS (AV) FISTULA CREATION;  Surgeon: Elam Dutch, MD;  Location: [place];  Service: Vascular;  Laterality: Right;   [place] Right 10/27/2020   Procedure: RIGHT UPPER EXTREMITY SECOND STAGE [place];  Surgeon: Elam Dutch, MD;  Location: [place];  Service: Vascular;  Laterality: Right;   BIOPSY  03/11/2022   Procedure: BIOPSY;  Surgeon: Irving Copas., MD;  Location[facility] ENDOSCOPY;  Service: Gastroenterology;;   COLONOSCOPY WITH PROPOFOL N/A 03/11/2022   Procedure: COLONOSCOPY WITH PROPOFOL;  Surgeon: Irving Copas., MD;  Location: [place];   Service: Gastroenterology;  Laterality: N/A;   foramen magnum ependymoma surgery  2003   Dr Rita Ohara   JOINT REPLACEMENT Bilateral    NASAL SINUS SURGERY     1973    TONSILLECTOMY     TOTAL KNEE ARTHROPLASTY     L 2008, R 2009, R 2016- Dr Maureen Ralphs   TOTAL KNEE REVISION Right 03/05/2015   Procedure: RIGHT TOTAL KNEE ARTHROPLASTY REVISION;  Surgeon: Gaynelle Arabian, MD;  [facility];  Service: Orthopedics;  Laterality: Right;    reports that she quit smoking about 33 years ago. Her smoking use included cigarettes. She has a 20.00 pack-year smoking history. She has never used smokeless tobacco. She reports that she does not currently use alcohol. She reports that she does not use drugs. family history includes Cancer in her brother; Crohn's disease in her maternal uncle; Depression in her mother; Diabetes in her brother, brother, father, mother, and another family member; Heart attack in her brother; Heart disease in her brother and brother; Hyperlipidemia in her brother, brother, and father; Hypertension in her brother, brother, father, and mother; Stroke in her brother; Stroke (age of onset: 54) in her mother.  Allergies  Allergen Reactions   Erythromycin Diarrhea and Nausea And Vomiting   Gabapentin Other (See Comments)    Confusion and falling  Hallucinations   Current Outpatient Medications on File Prior to Visit  Medication Sig Dispense Refill   Accu-Chek Softclix Lancets lancets Use 1 lancet up to 4 times daily as directed 100 each 0   acetaminophen (TYLENOL) 500 MG tablet Take 1,000 mg by mouth 2 (two) times daily as needed for headache.     acidophilus (RISAQUAD) CAPS capsule Take 1 capsule by mouth daily.     aspirin-acetaminophen-caffeine (EXCEDRIN MIGRAINE) 250-250-65 MG tablet Take 1 tablet by mouth daily as needed for headache or migraine. Max 3 doses in one week     B Complex-C-Folic Acid (RENA-VITE PO) Take 1 tablet by mouth every morning.     blood glucose meter kit and  supplies Dispense based on patient and insurance preference. Used to check blood sugar daily, DX: E11.9 OneTouch 1 each 0   Blood Glucose Monitoring Suppl (ACCU-CHEK GUIDE ME) w/Device KIT Use as directed to check blood sugar four times daily. 1 kit 0   buPROPion (WELLBUTRIN XL) 300 MG 24 hr tablet Take 1 tablet (300 mg total) by mouth in the morning. 90 tablet 1   carboxymethylcellulose (REFRESH PLUS) 0.5 % SOLN Place 1 drop into both eyes 3 (three) times daily as needed (dry eyes/irritation).     cyclobenzaprine (FLEXERIL) 10 MG tablet Take 1 tablet (10 mg total) by mouth 3 (three) times daily as needed for muscle spasms. 270 tablet 0   ferric citrate (AURYXIA) 1 GM 210 MG(Fe) tablet Take 210 mg by mouth 3 (three) times daily with meals.     FLUoxetine (PROZAC) 20 MG capsule Take 1 capsule (20 mg total) by mouth in the morning. 90 capsule 1   glucose blood (ACCU-CHEK GUIDE) test strip Use to check blood sugar up to four times daily. 50 each 0   glucose blood (TRUETEST TEST) test strip Used to check blood sugar daily, DX: E11.9 100 each 3   ketoconazole (NIZORAL) 2 % cream Apply 1 application  topically daily.     lidocaine (LIDODERM) 5 % Place 2 patches onto the skin daily. 60 patch 1   lidocaine (XYLOCAINE) 5 % ointment Apply 1 application topically 3 (three) times daily as needed for moderate pain. 90 g 0   lidocaine-prilocaine (EMLA) cream Apply 1 application topically See admin instructions. Apply topically to port access one hour prior to dialysis, cover with plastic- Tuesday, Thursday, Saturday     LORazepam (ATIVAN) 2 MG tablet Take 1 tablet (2 mg total) by mouth 2 (two) times daily as needed for anxiety 180 tablet 1   midodrine (PROAMATINE) 5 MG tablet Take 1-2 tablet by mouth three times a week as directed Take 30 minutes before dialysis and another tablet during middle of dialysis treatment (Patient taking differently: Take 5 mg by mouth See admin instructions. Take 5 mg by mouth at 5:30  am  and 8:30 am on Tuesday, Thursday and Saturday.) 90 tablet 3   multivitamin-lutein (OCUVITE-LUTEIN) CAPS capsule Take 1 capsule by mouth every morning.     naloxone (NARCAN) nasal spray 4 mg/0.1 mL Take by nasal route every 3 minutes until patient awakes or EMS arrives. 2 each 0   oxyCODONE (ROXICODONE) 15 MG immediate release tablet Take 1 tablet (15 mg total) by mouth 4 (four) times daily as needed. 120 tablet 0   pantoprazole (PROTONIX) 40 MG tablet TAKE 1 TABLET  BY MOUTH  DAILY 90 tablet 3   pilocarpine (SALAGEN) 5 MG tablet TAKE 1 TABLET BY MOUTH  TWICE DAILY (Patient taking differently: Take 5 mg by mouth daily.) 180 tablet 2   pioglitazone (ACTOS) 15 MG tablet TAKE 1 TABLET BY MOUTH  DAILY 90 tablet 3   polyethylene glycol (MIRALAX / GLYCOLAX) 17 g packet Take 17 g by mouth every 7 (seven) days.     PREVIDENT 5000 DRY MOUTH 1.1 % GEL dental gel Place 1 application  onto teeth in the morning and at bedtime.     No current facility-administered medications on file prior to visit.        ROS:  All others reviewed and negative.  Objective        PE:  BP 118/60 (BP Location: Left Arm, Patient Position: Sitting, Cuff Size: Large)   Pulse 84   Temp 98.1 F (36.7 C) (Oral)   Ht $R'5\' 2"'PW$  (1.575 m)   Wt 211 lb 6.4 oz (95.9 kg)   SpO2 94%   BMI 38.67 kg/m                 Constitutional: Pt appears in NAD               HENT: Head: NCAT.                Right Ear: External ear normal.                 Left Ear: External ear normal.                Eyes: . Pupils are equal, round, and reactive to light. Conjunctivae and EOM are normal               Nose: without d/c or deformity               Neck: Neck supple. Gross normal ROM               Cardiovascular: Normal rate and regular rhythm.                 Pulmonary/Chest: Effort normal and breath sounds without rales or wheezing.                Abd:  Soft, NT, ND, + BS, no organomegaly               Neurological: Pt is alert. At baseline  orientation, motor grossly intact               Skin: Skin is warm. No rashes, no other new lesions, LE edema - ***               Psychiatric: Pt behavior is normal without agitation   Micro: none  Cardiac tracings I have personally interpreted today:  none  Pertinent Radiological findings (summarize): none   Lab Results  Component Value Date   WBC 14.4 (H) 03/12/2022   HGB 10.0 (L) 03/12/2022   HCT 30.2 (L) 03/12/2022   PLT 170 03/12/2022   GLUCOSE 116 (H) 03/12/2022   CHOL 211 (H) 05/14/2022   TRIG 118 05/14/2022   HDL 55 05/14/2022   LDLCALC 135 (H) 05/14/2022   ALT 21 03/10/2022   AST 24 03/10/2022   NA 133 (L) 03/12/2022   K 3.1 (L) 03/12/2022   CL 94 (L) 03/12/2022   CREATININE 2.99 (H) 03/12/2022   BUN 21 03/12/2022   CO2 27 03/12/2022   TSH  3.98 04/16/2020   INR 1.1 03/10/2022   HGBA1C 6.3 (H) 03/10/2022   Assessment/Plan:  CIARA KAGAN is a 71 y.o. White or Caucasian [1] female with  has a past medical history of Anemia, Anxiety, Brain tumor (benign) ([place]), Colitis (2010), Depression, Diabetes mellitus, Dyspnea, ESRD (end stage renal disease) ([place]), Fibromyalgia, GERD (gastroesophageal reflux disease), Headache, History of blood transfusion, Hypertension, IBS (irritable bowel syndrome), LBP (low back pain), Neuropathy, Osteoarthritis, Osteopenia, Pneumonia, Rotator cuff tear, right, and Sinusitis.  No problem-specific Assessment & Plan notes found for this encounter.  Followup: No follow-ups on file.  Cathlean Cower, MD 06/02/2022 3:04 PM [facility]

## 2022-06-02 NOTE — Patient Instructions (Signed)
Please take all new medication as prescribed - the rybelsus 3 mg per day  Please call for the glimeparide instead if this is too expensive  Please continue all other medications as before, and refills have been done if requested.  Please have the pharmacy call with any other refills you may need.  Please keep your appointments with your specialists as you may have planned  Good Luck with your right shoulder surgury tomorrow!

## 2022-06-03 ENCOUNTER — Ambulatory Visit (HOSPITAL_BASED_OUTPATIENT_CLINIC_OR_DEPARTMENT_OTHER): Payer: Medicare Other | Admitting: Anesthesiology

## 2022-06-03 ENCOUNTER — Other Ambulatory Visit (HOSPITAL_COMMUNITY): Payer: Self-pay

## 2022-06-03 ENCOUNTER — Ambulatory Visit (HOSPITAL_COMMUNITY)
Admission: RE | Admit: 2022-06-03 | Discharge: 2022-06-03 | Disposition: A | Discharge status: Home / Self Care | Payer: Medicare Other | Source: Ambulatory Visit | Attending: Orthopedic Surgery | Admitting: Orthopedic Surgery

## 2022-06-03 ENCOUNTER — Ambulatory Visit (HOSPITAL_COMMUNITY): Payer: Medicare Other | Admitting: Physician Assistant

## 2022-06-03 ENCOUNTER — Encounter (HOSPITAL_COMMUNITY): Payer: Self-pay | Admitting: Orthopedic Surgery

## 2022-06-03 ENCOUNTER — Encounter (HOSPITAL_COMMUNITY)
Admission: RE | Disposition: A | Discharge status: Home / Self Care | Payer: Self-pay | Source: Ambulatory Visit | Attending: Orthopedic Surgery

## 2022-06-03 ENCOUNTER — Other Ambulatory Visit: Payer: Self-pay

## 2022-06-03 DIAGNOSIS — D759 Disease of blood and blood-forming organs, unspecified: Secondary | ICD-10-CM | POA: Diagnosis not present

## 2022-06-03 DIAGNOSIS — F32A Depression, unspecified: Secondary | ICD-10-CM | POA: Diagnosis not present

## 2022-06-03 DIAGNOSIS — Z7984 Long term (current) use of oral hypoglycemic drugs: Secondary | ICD-10-CM

## 2022-06-03 DIAGNOSIS — Z992 Dependence on renal dialysis: Secondary | ICD-10-CM

## 2022-06-03 DIAGNOSIS — E1122 Type 2 diabetes mellitus with diabetic chronic kidney disease: Secondary | ICD-10-CM

## 2022-06-03 DIAGNOSIS — M75101 Unspecified rotator cuff tear or rupture of right shoulder, not specified as traumatic: Secondary | ICD-10-CM | POA: Diagnosis not present

## 2022-06-03 DIAGNOSIS — Z01818 Encounter for other preprocedural examination: Secondary | ICD-10-CM

## 2022-06-03 DIAGNOSIS — R519 Headache, unspecified: Secondary | ICD-10-CM | POA: Insufficient documentation

## 2022-06-03 DIAGNOSIS — K219 Gastro-esophageal reflux disease without esophagitis: Secondary | ICD-10-CM | POA: Insufficient documentation

## 2022-06-03 DIAGNOSIS — N186 End stage renal disease: Secondary | ICD-10-CM

## 2022-06-03 DIAGNOSIS — I120 Hypertensive chronic kidney disease with stage 5 chronic kidney disease or end stage renal disease: Secondary | ICD-10-CM | POA: Diagnosis not present

## 2022-06-03 DIAGNOSIS — E1129 Type 2 diabetes mellitus with other diabetic kidney complication: Secondary | ICD-10-CM

## 2022-06-03 DIAGNOSIS — M1990 Unspecified osteoarthritis, unspecified site: Secondary | ICD-10-CM | POA: Diagnosis not present

## 2022-06-03 DIAGNOSIS — Z87891 Personal history of nicotine dependence: Secondary | ICD-10-CM

## 2022-06-03 DIAGNOSIS — M797 Fibromyalgia: Secondary | ICD-10-CM | POA: Diagnosis not present

## 2022-06-03 DIAGNOSIS — D649 Anemia, unspecified: Secondary | ICD-10-CM | POA: Diagnosis not present

## 2022-06-03 DIAGNOSIS — F419 Anxiety disorder, unspecified: Secondary | ICD-10-CM | POA: Diagnosis not present

## 2022-06-03 DIAGNOSIS — G8918 Other acute postprocedural pain: Secondary | ICD-10-CM | POA: Diagnosis not present

## 2022-06-03 DIAGNOSIS — M12811 Other specific arthropathies, not elsewhere classified, right shoulder: Secondary | ICD-10-CM

## 2022-06-03 DIAGNOSIS — M19011 Primary osteoarthritis, right shoulder: Secondary | ICD-10-CM | POA: Diagnosis not present

## 2022-06-03 HISTORY — PX: REVERSE SHOULDER ARTHROPLASTY: SHX5054

## 2022-06-03 LAB — BASIC METABOLIC PANEL
Anion gap: 13 (ref 5–15)
BUN: 34 mg/dL — ABNORMAL HIGH (ref 8–23)
CO2: 32 mmol/L (ref 22–32)
Calcium: 9.8 mg/dL (ref 8.9–10.3)
Chloride: 92 mmol/L — ABNORMAL LOW (ref 98–111)
Creatinine, Ser: 3.3 mg/dL — ABNORMAL HIGH (ref 0.44–1.00)
GFR, Estimated: 14 mL/min — ABNORMAL LOW (ref >60)
Glucose, Bld: 163 mg/dL — ABNORMAL HIGH (ref 70–99)
Potassium: 3.5 mmol/L (ref 3.5–5.1)
Sodium: 137 mmol/L (ref 135–145)

## 2022-06-03 LAB — CBC
HCT: 33.1 % — ABNORMAL LOW (ref 36.0–46.0)
Hemoglobin: 10.5 g/dL — ABNORMAL LOW (ref 12.0–15.0)
MCH: 32.7 pg (ref 26.0–34.0)
MCHC: 31.7 g/dL (ref 30.0–36.0)
MCV: 103.1 fL — ABNORMAL HIGH (ref 80.0–100.0)
Platelets: 185 10*3/uL (ref 150–400)
RBC: 3.21 MIL/uL — ABNORMAL LOW (ref 3.87–5.11)
RDW: 13.3 % (ref 11.5–15.5)
WBC: 10.6 10*3/uL — ABNORMAL HIGH (ref 4.0–10.5)
nRBC: 0 % (ref 0.0–0.2)

## 2022-06-03 LAB — GLUCOSE, CAPILLARY
Glucose-Capillary: 194 mg/dL — ABNORMAL HIGH (ref 70–99)
Glucose-Capillary: 156 mg/dL — ABNORMAL HIGH (ref 70–99)

## 2022-06-03 LAB — TYPE AND SCREEN
ABO/RH(D): O POS
Antibody Screen: NEGATIVE

## 2022-06-03 LAB — SURGICAL PCR SCREEN
MRSA, PCR: NEGATIVE
Staphylococcus aureus: NEGATIVE

## 2022-06-03 SURGERY — ARTHROPLASTY, SHOULDER, TOTAL, REVERSE
Anesthesia: General | Site: Shoulder | Laterality: Right

## 2022-06-03 MED ORDER — ROCURONIUM BROMIDE 10 MG/ML (PF) SYRINGE
PREFILLED_SYRINGE | INTRAVENOUS | Status: DC | PRN
  Administered 2022-06-03: 20 mg via INTRAVENOUS
  Administered 2022-06-03: 50 mg via INTRAVENOUS

## 2022-06-03 MED ORDER — ONDANSETRON HCL 4 MG/2ML IJ SOLN
4.0000 mg | Freq: Once | INTRAMUSCULAR | Status: DC | PRN
Start: 2022-06-03 — End: 2022-06-03

## 2022-06-03 MED ORDER — ONDANSETRON HCL 4 MG PO TABS: 4.0000 mg | ORAL_TABLET | Freq: Four times a day (QID) | ORAL | Status: DC | PRN

## 2022-06-03 MED ORDER — METOCLOPRAMIDE HCL 5 MG/ML IJ SOLN: 5.0000 mg | Freq: Three times a day (TID) | INTRAMUSCULAR | Status: DC | PRN

## 2022-06-03 MED ORDER — PHENYLEPHRINE 80 MCG/ML (10ML) SYRINGE FOR IV PUSH (FOR BLOOD PRESSURE SUPPORT)
PREFILLED_SYRINGE | INTRAVENOUS | Status: DC | PRN
  Administered 2022-06-03 (×3): 80 ug via INTRAVENOUS

## 2022-06-03 MED ORDER — LIDOCAINE HCL (PF) 2 % IJ SOLN
INTRAMUSCULAR | Status: AC
  Filled 2022-06-03: qty 20

## 2022-06-03 MED ORDER — DEXAMETHASONE SODIUM PHOSPHATE 10 MG/ML IJ SOLN
INTRAMUSCULAR | Status: DC | PRN
  Administered 2022-06-03: 4 mg via INTRAVENOUS

## 2022-06-03 MED ORDER — BUPIVACAINE LIPOSOME 1.3 % IJ SUSP
INTRAMUSCULAR | Status: DC | PRN
  Administered 2022-06-03: 10 mL via PERINEURAL

## 2022-06-03 MED ORDER — ACETAMINOPHEN 500 MG PO TABS: 1000.0000 mg | ORAL_TABLET | Freq: Once | ORAL | Status: DC

## 2022-06-03 MED ORDER — 0.9 % SODIUM CHLORIDE (POUR BTL) OPTIME
TOPICAL | Status: DC | PRN
  Administered 2022-06-03: 1000 mL

## 2022-06-03 MED ORDER — ACETAMINOPHEN 10 MG/ML IV SOLN
INTRAVENOUS | Status: AC
  Filled 2022-06-03: qty 100

## 2022-06-03 MED ORDER — POLYETHYLENE GLYCOL 3350 17 G PO PACK: 17.0000 g | PACK | Freq: Every day | ORAL | Status: DC | PRN

## 2022-06-03 MED ORDER — PHENOL 1.4 % MT LIQD: 1.0000 | OROMUCOSAL | Status: DC | PRN

## 2022-06-03 MED ORDER — ONDANSETRON HCL 4 MG/2ML IJ SOLN
INTRAMUSCULAR | Status: DC | PRN
  Administered 2022-06-03: 4 mg via INTRAVENOUS

## 2022-06-03 MED ORDER — PHENYLEPHRINE HCL-NACL 20-0.9 MG/250ML-% IV SOLN
INTRAVENOUS | Status: AC
  Filled 2022-06-03: qty 750

## 2022-06-03 MED ORDER — PHENYLEPHRINE HCL-NACL 20-0.9 MG/250ML-% IV SOLN
INTRAVENOUS | Status: DC | PRN
  Administered 2022-06-03: 45 ug/min via INTRAVENOUS

## 2022-06-03 MED ORDER — PROPOFOL 10 MG/ML IV BOLUS
INTRAVENOUS | Status: DC | PRN
  Administered 2022-06-03: 150 mg via INTRAVENOUS
  Administered 2022-06-03: 30 mg via INTRAVENOUS

## 2022-06-03 MED ORDER — EPHEDRINE SULFATE-NACL 50-0.9 MG/10ML-% IV SOSY
PREFILLED_SYRINGE | INTRAVENOUS | Status: DC | PRN
  Administered 2022-06-03: 5 mg via INTRAVENOUS
  Administered 2022-06-03: 10 mg via INTRAVENOUS
  Administered 2022-06-03 (×2): 5 mg via INTRAVENOUS
  Administered 2022-06-03: 10 mg via INTRAVENOUS
  Administered 2022-06-03 (×2): 5 mg via INTRAVENOUS

## 2022-06-03 MED ORDER — LIDOCAINE 2% (20 MG/ML) 5 ML SYRINGE
INTRAMUSCULAR | Status: DC | PRN
  Administered 2022-06-03: 40 mg via INTRAVENOUS
  Administered 2022-06-03: 60 mg via INTRAVENOUS

## 2022-06-03 MED ORDER — FENTANYL CITRATE (PF) 100 MCG/2ML IJ SOLN
INTRAMUSCULAR | Status: AC
  Filled 2022-06-03: qty 2

## 2022-06-03 MED ORDER — ROCURONIUM BROMIDE 10 MG/ML (PF) SYRINGE
PREFILLED_SYRINGE | INTRAVENOUS | Status: AC
  Filled 2022-06-03: qty 10

## 2022-06-03 MED ORDER — BISACODYL 5 MG PO TBEC: 5.0000 mg | DELAYED_RELEASE_TABLET | Freq: Every day | ORAL | Status: DC | PRN

## 2022-06-03 MED ORDER — CEFAZOLIN SODIUM-DEXTROSE 2-4 GM/100ML-% IV SOLN
2.0000 g | INTRAVENOUS | Status: AC
Start: 2022-06-03 — End: 2022-06-03
  Administered 2022-06-03: 2 g via INTRAVENOUS
  Filled 2022-06-03: qty 100

## 2022-06-03 MED ORDER — DOCUSATE SODIUM 100 MG PO CAPS: 100.0000 mg | ORAL_CAPSULE | Freq: Two times a day (BID) | ORAL | Status: DC

## 2022-06-03 MED ORDER — PANTOPRAZOLE SODIUM 40 MG PO TBEC: 40.0000 mg | DELAYED_RELEASE_TABLET | Freq: Every day | ORAL | Status: DC

## 2022-06-03 MED ORDER — LACTATED RINGERS IV SOLN: INTRAVENOUS | Status: DC

## 2022-06-03 MED ORDER — PROPOFOL 10 MG/ML IV BOLUS
INTRAVENOUS | Status: AC
  Filled 2022-06-03: qty 20

## 2022-06-03 MED ORDER — ACETAMINOPHEN 10 MG/ML IV SOLN
INTRAVENOUS | Status: DC | PRN
  Administered 2022-06-03: 1000 mg via INTRAVENOUS

## 2022-06-03 MED ORDER — TRANEXAMIC ACID-NACL 1000-0.7 MG/100ML-% IV SOLN
1000.0000 mg | INTRAVENOUS | Status: AC
  Administered 2022-06-03: 1000 mg via INTRAVENOUS
  Filled 2022-06-03: qty 100

## 2022-06-03 MED ORDER — SUGAMMADEX SODIUM 200 MG/2ML IV SOLN
INTRAVENOUS | Status: DC | PRN
  Administered 2022-06-03: 200 mg via INTRAVENOUS

## 2022-06-03 MED ORDER — ONDANSETRON HCL 4 MG/2ML IJ SOLN: 4.0000 mg | Freq: Four times a day (QID) | INTRAMUSCULAR | Status: DC | PRN

## 2022-06-03 MED ORDER — MIDAZOLAM HCL 2 MG/2ML IJ SOLN
INTRAMUSCULAR | Status: AC
  Filled 2022-06-03: qty 2

## 2022-06-03 MED ORDER — METOCLOPRAMIDE HCL 5 MG PO TABS: 5.0000 mg | ORAL_TABLET | Freq: Three times a day (TID) | ORAL | Status: DC | PRN

## 2022-06-03 MED ORDER — STERILE WATER FOR IRRIGATION IR SOLN
Status: DC | PRN
  Administered 2022-06-03: 2000 mL

## 2022-06-03 MED ORDER — VANCOMYCIN HCL 1000 MG IV SOLR
INTRAVENOUS | Status: DC | PRN
  Administered 2022-06-03: 1000 mg

## 2022-06-03 MED ORDER — DIPHENHYDRAMINE HCL 12.5 MG/5ML PO ELIX: 12.5000 mg | ORAL_SOLUTION | ORAL | Status: DC | PRN

## 2022-06-03 MED ORDER — HYDROMORPHONE HCL 2 MG PO TABS
2.0000 mg | ORAL_TABLET | ORAL | 0 refills | Status: DC | PRN
  Filled 2022-06-03: qty 30, 5d supply, fill #0

## 2022-06-03 MED ORDER — PHENYLEPHRINE 80 MCG/ML (10ML) SYRINGE FOR IV PUSH (FOR BLOOD PRESSURE SUPPORT)
PREFILLED_SYRINGE | INTRAVENOUS | Status: AC
  Filled 2022-06-03: qty 10

## 2022-06-03 MED ORDER — SODIUM CHLORIDE 0.9 % IV SOLN: INTRAVENOUS | Status: DC | PRN

## 2022-06-03 MED ORDER — BUPIVACAINE HCL (PF) 0.5 % IJ SOLN
INTRAMUSCULAR | Status: DC | PRN
  Administered 2022-06-03: 10 mL via PERINEURAL

## 2022-06-03 MED ORDER — EPHEDRINE 5 MG/ML INJ
INTRAVENOUS | Status: AC
  Filled 2022-06-03: qty 5

## 2022-06-03 MED ORDER — FENTANYL CITRATE (PF) 100 MCG/2ML IJ SOLN
INTRAMUSCULAR | Status: DC | PRN
Start: 2022-06-03 — End: 2022-06-03
  Administered 2022-06-03 (×2): 50 ug via INTRAVENOUS

## 2022-06-03 MED ORDER — VANCOMYCIN HCL 1000 MG IV SOLR
INTRAVENOUS | Status: AC
  Filled 2022-06-03: qty 20

## 2022-06-03 MED ORDER — ONDANSETRON HCL 4 MG PO TABS
4.0000 mg | ORAL_TABLET | Freq: Three times a day (TID) | ORAL | 0 refills | Status: DC | PRN
  Filled 2022-06-03: qty 10, 4d supply, fill #0

## 2022-06-03 MED ORDER — ROCURONIUM BROMIDE 10 MG/ML (PF) SYRINGE
PREFILLED_SYRINGE | INTRAVENOUS | Status: AC
  Filled 2022-06-03: qty 20

## 2022-06-03 MED ORDER — CHLORHEXIDINE GLUCONATE 0.12 % MT SOLN
15.0000 mL | Freq: Once | OROMUCOSAL | Status: AC
  Administered 2022-06-03: 15 mL via OROMUCOSAL

## 2022-06-03 MED ORDER — MIDAZOLAM HCL 5 MG/5ML IJ SOLN
INTRAMUSCULAR | Status: DC | PRN
  Administered 2022-06-03: 1 mg via INTRAVENOUS

## 2022-06-03 MED ORDER — ORAL CARE MOUTH RINSE: 15.0000 mL | Freq: Once | OROMUCOSAL | Status: AC

## 2022-06-03 MED ORDER — ALUM & MAG HYDROXIDE-SIMETH 200-200-20 MG/5ML PO SUSP: 30.0000 mL | ORAL | Status: DC | PRN

## 2022-06-03 MED ORDER — MENTHOL 3 MG MT LOZG: 1.0000 | LOZENGE | OROMUCOSAL | Status: DC | PRN

## 2022-06-03 MED ORDER — FENTANYL CITRATE PF 50 MCG/ML IJ SOSY: 25.0000 ug | PREFILLED_SYRINGE | INTRAMUSCULAR | Status: DC | PRN

## 2022-06-03 SURGICAL SUPPLY — 81 items
ADH SKN CLS APL DERMABOND .7 (GAUZE/BANDAGES/DRESSINGS) ×1
AID PSTN UNV HD RSTRNT DISP (MISCELLANEOUS) ×1
BAG COUNTER SPONGE SURGICOUNT (BAG) ×1 IMPLANT
BAG SPEC THK2 15X12 ZIP CLS (MISCELLANEOUS) ×1
BAG SPNG CNTER NS LX DISP (BAG) ×1
BAG ZIPLOCK 12X15 (MISCELLANEOUS) ×2 IMPLANT
BLADE SAW SGTL 83.5X18.5 (BLADE) ×2 IMPLANT
BNDG COHESIVE 4X5 TAN ST LF (GAUZE/BANDAGES/DRESSINGS) ×2 IMPLANT
BSPLAT GLND +2X24 MDLR (Joint) ×1 IMPLANT
CLSR STERI-STRIP ANTIMIC 1/2X4 (GAUZE/BANDAGES/DRESSINGS) ×1 IMPLANT
COOLER ICEMAN CLASSIC (MISCELLANEOUS) ×2 IMPLANT
COVER BACK TABLE 60X90IN (DRAPES) ×2 IMPLANT
COVER SURGICAL LIGHT HANDLE (MISCELLANEOUS) ×2 IMPLANT
CUP SUT UNIV REVERS 36 NEUTRAL (Cup) ×1 IMPLANT
DERMABOND ADVANCED (GAUZE/BANDAGES/DRESSINGS) ×1
DERMABOND ADVANCED .7 DNX12 (GAUZE/BANDAGES/DRESSINGS) ×1 IMPLANT
DRAPE INCISE IOBAN 66X45 STRL (DRAPES) ×1 IMPLANT
DRAPE ORTHO SPLIT 77X108 STRL (DRAPES) ×4
DRAPE SHEET LG 3/4 BI-LAMINATE (DRAPES) ×2 IMPLANT
DRAPE SURG 17X11 SM STRL (DRAPES) ×2 IMPLANT
DRAPE SURG ORHT 6 SPLT 77X108 (DRAPES) ×2 IMPLANT
DRAPE TOP 10253 STERILE (DRAPES) ×2 IMPLANT
DRAPE U-SHAPE 47X51 STRL (DRAPES) ×2 IMPLANT
DRESSING AQUACEL AG SP 3.5X6 (GAUZE/BANDAGES/DRESSINGS) ×1 IMPLANT
DRSG AQUACEL AG ADV 3.5X 6 (GAUZE/BANDAGES/DRESSINGS) ×1 IMPLANT
DRSG AQUACEL AG ADV 3.5X10 (GAUZE/BANDAGES/DRESSINGS) IMPLANT
DRSG AQUACEL AG SP 3.5X6 (GAUZE/BANDAGES/DRESSINGS) ×2
DRSG TEGADERM 8X12 (GAUZE/BANDAGES/DRESSINGS) ×2 IMPLANT
DURAPREP 26ML APPLICATOR (WOUND CARE) ×2 IMPLANT
ELECT BLADE TIP CTD 4 INCH (ELECTRODE) ×2 IMPLANT
ELECT PENCIL ROCKER SW 15FT (MISCELLANEOUS) ×2 IMPLANT
ELECT REM PT RETURN 15FT ADLT (MISCELLANEOUS) ×2 IMPLANT
FACESHIELD WRAPAROUND (MASK) ×8 IMPLANT
FACESHIELD WRAPAROUND OR TEAM (MASK) ×4 IMPLANT
GLENOID UNI REV MOD 24 +2 LAT (Joint) ×1 IMPLANT
GLENOSPHERE 36 +4 LAT/24 (Joint) ×1 IMPLANT
GLOVE BIO SURGEON STRL SZ 6.5 (GLOVE) ×2 IMPLANT
GLOVE BIO SURGEON STRL SZ7.5 (GLOVE) ×3 IMPLANT
GLOVE BIO SURGEON STRL SZ8 (GLOVE) ×2 IMPLANT
GLOVE SS BIOGEL STRL SZ 7 (GLOVE) ×1 IMPLANT
GLOVE SS BIOGEL STRL SZ 7.5 (GLOVE) ×1 IMPLANT
GLOVE SUPERSENSE BIOGEL SZ 7 (GLOVE) ×3
GLOVE SUPERSENSE BIOGEL SZ 7.5 (GLOVE) ×1
GOWN STRL REIN XL XLG (GOWN DISPOSABLE) ×4 IMPLANT
INSERT HUMERAL UNI REVERS 36 3 (Insert) ×1 IMPLANT
KIT BASIN OR (CUSTOM PROCEDURE TRAY) ×2 IMPLANT
KIT TURNOVER KIT A (KITS) ×1 IMPLANT
MANIFOLD NEPTUNE II (INSTRUMENTS) ×2 IMPLANT
NDL TAPERED W/ NITINOL LOOP (MISCELLANEOUS) ×1 IMPLANT
NEEDLE TAPERED W/ NITINOL LOOP (MISCELLANEOUS) ×2 IMPLANT
NS IRRIG 1000ML POUR BTL (IV SOLUTION) ×2 IMPLANT
PACK SHOULDER (CUSTOM PROCEDURE TRAY) ×2 IMPLANT
PAD ARMBOARD 7.5X6 YLW CONV (MISCELLANEOUS) ×2 IMPLANT
PAD COLD SHLDR WRAP-ON (PAD) ×2 IMPLANT
PAD ORTHO SHOULDER 7X19 LRG (SOFTGOODS) ×1 IMPLANT
PIN NITINOL TARGETER 2.8 (PIN) IMPLANT
PIN SET MODULAR GLENOID SYSTEM (PIN) ×1 IMPLANT
RESTRAINT HEAD UNIVERSAL NS (MISCELLANEOUS) ×2 IMPLANT
SCREW CENTRAL MODULAR 25 (Screw) ×1 IMPLANT
SCREW PERI LOCK 5.5X16 (Screw) ×2 IMPLANT
SCREW PERI LOCK 5.5X24 (Screw) ×1 IMPLANT
SCREW PERIPHERAL 5.5X28 LOCK (Screw) ×1 IMPLANT
SLING ARM FOAM STRAP MED (SOFTGOODS) ×1 IMPLANT
SPONGE T-LAP 4X18 ~~LOC~~+RFID (SPONGE) ×2 IMPLANT
STEM HUMERAL MOD SZ 5 135 DEG (Stem) ×1 IMPLANT
SUCTION FRAZIER HANDLE 12FR (TUBING) ×2
SUCTION TUBE FRAZIER 12FR DISP (TUBING) ×1 IMPLANT
SUT FIBERWIRE #2 38 T-5 BLUE (SUTURE)
SUT MNCRL AB 3-0 PS2 18 (SUTURE) ×2 IMPLANT
SUT MON AB 2-0 CT1 36 (SUTURE) ×2 IMPLANT
SUT VIC AB 1 CT1 36 (SUTURE) ×2 IMPLANT
SUT VIC AB 2-0 CT1 27 (SUTURE) ×4
SUT VIC AB 2-0 CT1 TAPERPNT 27 (SUTURE) IMPLANT
SUTURE FIBERWR #2 38 T-5 BLUE (SUTURE) IMPLANT
SUTURE TAPE 1.3 40 TPR END (SUTURE) ×2 IMPLANT
SUTURETAPE 1.3 40 TPR END (SUTURE) ×4
TOWEL OR 17X26 10 PK STRL BLUE (TOWEL DISPOSABLE) ×2 IMPLANT
TOWEL OR NON WOVEN STRL DISP B (DISPOSABLE) ×2 IMPLANT
TUBE SUCTION HIGH CAP CLEAR NV (SUCTIONS) ×2 IMPLANT
WATER STERILE IRR 1000ML POUR (IV SOLUTION) ×4 IMPLANT
YANKAUER SUCT BULB TIP 10FT TU (MISCELLANEOUS) ×1 IMPLANT

## 2022-06-03 NOTE — Anesthesia Procedure Notes (Signed)
Anesthesia Regional Block: Interscalene brachial plexus block   Pre-Anesthetic Checklist: , timeout performed,  Correct Patient, Correct Site, Correct Laterality,  Correct Procedure, Correct Position, site marked,  Risks and benefits discussed,  Surgical consent,  Pre-op evaluation,  At surgeon's request and post-op pain management  Laterality: Right  Prep: chloraprep       Needles:  Injection technique: Single-shot  Needle Type: Echogenic Stimulator Needle     Needle Length: 9cm  Needle Gauge: 22     Additional Needles:   Procedures:,,,, ultrasound used (permanent image in chart),,    Narrative:  Start time: 06/03/2022 6:57 AM End time: 06/03/2022 7:03 AM Injection made incrementally with aspirations every 5 mL.  Performed by: Personally  Anesthesiologist: Santa Lighter, MD  Additional Notes: Functioning IV was confirmed and monitors were applied.  A 63m 22ga Arrow echogenic stimulator needle was used. Sterile prep and drape, hand hygiene, and sterile gloves were used.  Negative aspiration and negative test dose prior to incremental administration of local anesthetic. The patient tolerated the procedure well.  Ultrasound guidance: relevent anatomy identified, needle position confirmed, local anesthetic spread visualized around nerve(s), vascular puncture avoided.  Image printed for medical record.

## 2022-06-03 NOTE — H&P (Signed)
[name]    Chief Complaint: Right shoulder rotator cuff tear arthropathy HPI: The patient is a 71 y.o. female with chronic and progressively increasing right shoulder pain related to severe rotator cuff tear arthropathy.  Due to her increasing functional limitations and failure to respond to prolonged attempts at conservative management, she is brought to the operating room today for planned right shoulder reverse arthroplasty.  Patient also has a number of significant underlying medical comorbidities including renal failure currently on dialysis.  Clearance has been obtained through her nephrologist and her dialysis schedule has been adjusted as well.  Past Medical History:  Diagnosis Date   Anemia    Anxiety    Brain tumor (benign) ([place])    Colitis 2010   microscopic- Dr Henrene Pastor   Depression    Diabetes mellitus    type II   Dyspnea    with exertion   ESRD (end stage renal disease) ([place])    TTUSAT [address]    Fibromyalgia    GERD (gastroesophageal reflux disease)    Headache    History of blood transfusion    after knee surgery   Hypertension    discontinued all diuretics and antihypertensives   IBS (irritable bowel syndrome)    LBP (low back pain)    Neuropathy    feet bilat    Osteoarthritis    Osteopenia    Pneumonia    hx of 2014    Rotator cuff tear, right    Sinusitis    currently being treated with antibiotic will complete 03/04/2015      Past Surgical History:  Procedure Laterality Date   AV FISTULA PLACEMENT Right 09/12/2020   Procedure: RIGHT ARTERIOVENOUS (AV) FISTULA CREATION;  Surgeon: Elam Dutch, MD;  Location: [place];  Service: Vascular;  Laterality: Right;   [place] Right 10/27/2020   Procedure: RIGHT UPPER EXTREMITY SECOND STAGE [place];  Surgeon: Elam Dutch, MD;  Location: [place];  Service: Vascular;  Laterality: Right;   BIOPSY  03/11/2022   Procedure: BIOPSY;  Surgeon: Irving Copas., MD;  Location[facility] ENDOSCOPY;  Service: Gastroenterology;;   COLONOSCOPY WITH PROPOFOL N/A 03/11/2022   Procedure: COLONOSCOPY WITH PROPOFOL;  Surgeon: Irving Copas., MD;  Location: [place];  Service: Gastroenterology;  Laterality: N/A;   foramen magnum ependymoma surgery  2003   Dr Rita Ohara   JOINT REPLACEMENT Bilateral    NASAL SINUS SURGERY     1973    TONSILLECTOMY     TOTAL KNEE ARTHROPLASTY     L 2008, R 2009, R 2016- Dr Maureen Ralphs   TOTAL KNEE REVISION Right 03/05/2015   Procedure: RIGHT TOTAL KNEE ARTHROPLASTY REVISION;  Surgeon: Gaynelle Arabian, MD;  [facility];  Service: Orthopedics;  Laterality: Right;    Family History  Problem Relation Age of Onset   Depression Mother    Hypertension Mother    Stroke Mother 62   Diabetes Mother    Diabetes Father    Hyperlipidemia Father    Hypertension Father    Crohn's disease Maternal Uncle    Cancer Brother        Prostate   Diabetes Brother    Heart disease Brother    Hyperlipidemia Brother    Hypertension Brother    Diabetes Brother    Heart disease Brother        Heart Disease before age 15   Heart attack Brother    Stroke Brother  X's 2   Hyperlipidemia Brother    Hypertension Brother    Diabetes Other    Colon cancer Neg Hx    Esophageal cancer Neg Hx    Stomach cancer Neg Hx    Pancreatic cancer Neg Hx    Breast cancer Neg Hx     Social History:  reports that she quit smoking about 33 years ago. Her smoking use included cigarettes. She has a 20.00 pack-year smoking history. She has never used smokeless tobacco. She reports that she does not currently use alcohol. She reports that she does not use drugs.  BMI: Estimated body mass index is 38.67 kg/m as calculated from the following:   Height as of 06/02/22: $RemoveBef'5\' 2"'wqsyPxMcXJ$  (1.575 m).   Weight as of this encounter: 95.9 kg.  Lab Results  Component Value Date   ALBUMIN 2.7 (L) 03/12/2022   Diabetes:   Patient has a diagnosis of diabetes,   Lab Results  Component Value Date   HGBA1C 6.3 (H) 03/10/2022   Smoking Status: Social History   Tobacco Use  Smoking Status Former   Packs/day: 1.00   Years: 20.00   Total pack years: 20.00   Types: Cigarettes   Quit date: 12/13/1988   Years since quitting: 33.4  Smokeless Tobacco Never   The patient is not currently a tobacco user. Counseling given: Not Answered      Medications Prior to Admission  Medication Sig Dispense Refill   acetaminophen (TYLENOL) 500 MG tablet Take 1,000 mg by mouth 2 (two) times daily as needed for headache.     acidophilus (RISAQUAD) CAPS capsule Take 1 capsule by mouth daily.     aspirin-acetaminophen-caffeine (EXCEDRIN MIGRAINE) 250-250-65 MG tablet Take 1 tablet by mouth daily as needed for headache or migraine. Max 3 doses in one week     B Complex-C-Folic Acid (RENA-VITE PO) Take 1 tablet by mouth every morning.     buPROPion (WELLBUTRIN XL) 300 MG 24 hr tablet Take 1 tablet (300 mg total) by mouth in the morning. 90 tablet 1   carboxymethylcellulose (REFRESH PLUS) 0.5 % SOLN Place 1 drop into both eyes 3 (three) times daily as needed (dry eyes/irritation).     cyclobenzaprine (FLEXERIL) 10 MG tablet Take 1 tablet (10 mg total) by mouth 3 (three) times daily as needed for muscle spasms. 270 tablet 0   ferric citrate (AURYXIA) 1 GM 210 MG(Fe) tablet Take 210 mg by mouth 3 (three) times daily with meals.     FLUoxetine (PROZAC) 20 MG capsule Take 1 capsule (20 mg total) by mouth in the morning. 90 capsule 1   glimepiride (AMARYL) 1 MG tablet Take 1 tablet (1 mg total) by mouth daily with breakfast. 90 tablet 3   ketoconazole (NIZORAL) 2 % cream Apply 1 application  topically daily.     lidocaine (LIDODERM) 5 % Place 2 patches onto the skin daily. 60 patch 1   lidocaine (XYLOCAINE) 5 % ointment Apply 1 application topically 3 (three) times daily as needed for moderate pain. 90 g 0   lidocaine-prilocaine (EMLA) cream Apply 1 application topically See  admin instructions. Apply topically to port access one hour prior to dialysis, cover with plastic- Tuesday, Thursday, Saturday     LORazepam (ATIVAN) 2 MG tablet Take 1 tablet (2 mg total) by mouth 2 (two) times daily as needed for anxiety 180 tablet 1   midodrine (PROAMATINE) 5 MG tablet Take 1-2 tablet by mouth three times a week as directed Take 30 minutes  before dialysis and another tablet during middle of dialysis treatment (Patient taking differently: Take 5 mg by mouth See admin instructions. Take 5 mg by mouth at 5:30 am  and 8:30 am on Tuesday, Thursday and Saturday.) 90 tablet 3   multivitamin-lutein (OCUVITE-LUTEIN) CAPS capsule Take 1 capsule by mouth every morning.     naloxone (NARCAN) nasal spray 4 mg/0.1 mL Take by nasal route every 3 minutes until patient awakes or EMS arrives. 2 each 0   oxyCODONE (ROXICODONE) 15 MG immediate release tablet Take 1 tablet (15 mg total) by mouth 4 (four) times daily as needed. 120 tablet 0   pantoprazole (PROTONIX) 40 MG tablet TAKE 1 TABLET BY MOUTH  DAILY 90 tablet 3   pilocarpine (SALAGEN) 5 MG tablet TAKE 1 TABLET BY MOUTH  TWICE DAILY (Patient taking differently: Take 5 mg by mouth daily.) 180 tablet 2   pioglitazone (ACTOS) 15 MG tablet TAKE 1 TABLET BY MOUTH  DAILY 90 tablet 3   polyethylene glycol (MIRALAX / GLYCOLAX) 17 g packet Take 17 g by mouth every 7 (seven) days.     PREVIDENT 5000 DRY MOUTH 1.1 % GEL dental gel Place 1 application  onto teeth in the morning and at bedtime.     Accu-Chek Softclix Lancets lancets Use 1 lancet up to 4 times daily as directed 100 each 0   blood glucose meter kit and supplies Dispense based on patient and insurance preference. Used to check blood sugar daily, DX: E11.9 OneTouch 1 each 0   Blood Glucose Monitoring Suppl (ACCU-CHEK GUIDE ME) w/Device KIT Use as directed to check blood sugar four times daily. 1 kit 0   glucose blood (ACCU-CHEK GUIDE) test strip Use to check blood sugar up to four times daily.  50 each 0   glucose blood (TRUETEST TEST) test strip Used to check blood sugar daily, DX: E11.9 100 each 3     Physical Exam: Right shoulder demonstrates severely restricted and painful motion as noted at recent office visit.  She has a dialysis fistula in the right upper extremity with the expected engorgement and enlargement of the cephalic vein.  Otherwise the skin is intact about the shoulder.  Plain radiographs confirm changes consistent with chronic rotator cuff tear arthropathy.  Vitals  Temp:  [98.1 F (36.7 C)-98.8 F (37.1 C)] 98.8 F (37.1 C) (06/22 0629) Pulse Rate:  [80-84] 80 (06/22 0629) Resp:  [15] 15 (06/22 0629) BP: (98-118)/(60-63) 98/63 (06/22 0629) SpO2:  [94 %-99 %] 99 % (06/22 0629) Weight:  [95.9 kg] 95.9 kg (06/22 0604)  Assessment/Plan  Impression: Right shoulder rotator cuff tear arthropathy  Plan of Action: Procedure(s): REVERSE SHOULDER ARTHROPLASTY  Taima Rada M Abeni Finchum 06/03/2022, 6:40 AM Contact # [phone]

## 2022-06-03 NOTE — Anesthesia Procedure Notes (Signed)
Procedure Name: Intubation Date/Time: 06/03/2022 8:00 AM  Performed by: Lavina Hamman, CRNAPre-anesthesia Checklist: Patient identified, Emergency Drugs available, Suction available, Patient being monitored and Timeout performed Patient Re-evaluated:Patient Re-evaluated prior to induction Oxygen Delivery Method: Circle system utilized Preoxygenation: Pre-oxygenation with 100% oxygen Induction Type: IV induction Ventilation: Mask ventilation without difficulty Laryngoscope Size: Mac and 3 Grade View: Grade I Tube type: Oral Tube size: 7.0 mm Number of attempts: 1 Airway Equipment and Method: Stylet Placement Confirmation: ETT inserted through vocal cords under direct vision, positive ETCO2, CO2 detector and breath sounds checked- equal and bilateral Secured at: 21 cm Tube secured with: Tape Dental Injury: Teeth and Oropharynx as per pre-operative assessment  Comments: ATOI

## 2022-06-03 NOTE — Evaluation (Signed)
Occupational Therapy Evaluation and Discharge Patient Details Name: LAURELYN TERRERO MRN: [id number] DOB: 1951-02-14 Today's Date: 06/03/2022   History of Present Illness This 71 yo female s/p right shoulder reverse arthroplasty.   Clinical Impression   This 71 yo female admitted and underwent above presents to acute OT with all education completed with pt and dtr as well as handouts provided. Acute OT will sign off.       Recommendations for follow up therapy are one component of a multi-disciplinary discharge planning process, led by the attending physician.  Recommendations may be updated based on patient status, additional functional criteria and insurance authorization.   Follow Up Recommendations  No OT follow up    Assistance Recommended at Discharge PRN  Patient can return home with the following A lot of help with bathing/dressing/bathroom;Assistance with cooking/housework;Assistance with feeding;Assist for transportation    Functional Status Assessment  Patient has had a recent decline in their functional status and demonstrates the ability to make significant improvements in function in a reasonable and predictable amount of time. (no further acute OT needs--all education completed)  Equipment Recommendations  None recommended by OT    Recommendations for Other Services       Precautions / Restrictions Precautions Precautions: Shoulder Type of Shoulder Precautions: If sitting in controlled environment, ok to come out of sling to give neck a break. Please sleep in it to protect until follow up in office.OK to use operative arm for feeding, hygiene and ADLs (20 ER, 45 ABD, 60 FF).Ok to instruct Pendulums and lap slides as exercises.AROM elbow, wrist and hand to tolerance-- Yes. No resisted internal rotation and no exercises for internal rotation Shoulder Interventions: Shoulder sling/immobilizer;Off for dressing/bathing/exercises Precaution Booklet Issued: Yes  (comment) Required Braces or Orthoses: Sling Restrictions Weight Bearing Restrictions: No      Mobility Bed Mobility               General bed mobility comments: Pt up in recliner    Transfers Overall transfer level: Modified independent                 General transfer comment: increased time               Vision Patient Visual Report: No change from baseline              Pertinent Vitals/Pain Pain Assessment Pain Assessment: No/denies pain     Hand Dominance Right   Extremity/Trunk Assessment Upper Extremity Assessment Upper Extremity Assessment: RUE deficits/detail RUE Deficits / Details: reverse shoulder this AM --nerve block not starting to wear off           Communication Communication Communication: No difficulties   Cognition Arousal/Alertness: Awake/alert Behavior During Therapy: WFL for tasks assessed/performed Overall Cognitive Status: Within Functional Limits for tasks assessed                                          Exercises Other Exercises Other Exercises: Pt and dtr aware pt needs to do 10 reps 3-5 times a day of composite finger flex/ext; wrist flex/ext, forearm supination/pronation, elbow flexion/ext, lap slides, and seated or standing pendulums.   Shoulder Instructions Shoulder Instructions Donning/doffing shirt without moving shoulder: Caregiver independent with task Method for sponge bathing under operated UE: Caregiver independent with task Donning/doffing sling/immobilizer: Caregiver independent with task Correct positioning of sling/immobilizer: Caregiver independent with task Pendulum  exercises (written home exercise program):  (pt and dtr verbalized understanding) ROM for elbow, wrist and digits of operated UE:  (pt and dtr verbalized understanding) Sling wearing schedule (on at all times/off for ADL's):  (pt and dtr verbalized understanding) Proper positioning of operated UE when showering:   (pt and dtr verbalized understanding) Dressing change:  (NA) Positioning of UE while sleeping:  (pt and dtr verbalized understanding)    Home Living Family/patient expects to be discharged to:: Private residence Living Arrangements: Other relatives Available Help at Discharge: Family;Available 24 hours/day                                    Prior Functioning/Environment Prior Level of Function : Independent/Modified Independent                        OT Problem List: Decreased range of motion;Decreased strength;Impaired UE functional use         OT Goals(Current goals can be found in the care plan section) Acute Rehab OT Goals Patient Stated Goal: home today         AM-PAC OT "6 Clicks" Daily Activity     Outcome Measure Help from another person eating meals?: A Little Help from another person taking care of personal grooming?: A Little Help from another person toileting, which includes using toliet, bedpan, or urinal?: A Little Help from another person bathing (including washing, rinsing, drying)?: A Lot Help from another person to put on and taking off regular upper body clothing?: A Lot Help from another person to put on and taking off regular lower body clothing?: A Lot 6 Click Score: 15   End of Session Nurse Communication:  (pt ready to go from OT standpoint)  Activity Tolerance: Patient tolerated treatment well Patient left: in chair  OT Visit Diagnosis: Muscle weakness (generalized) (M62.81)                Time: 6720-9470 OT Time Calculation (min): 43 min Charges:  OT General Charges $OT Visit: 1 Visit OT Evaluation $OT Eval Moderate Complexity: 1 Mod OT Treatments $Self Care/Home Management : 8-22 mins $Therapeutic Exercise: 8-22 mins  [place] Circle, OTR/L Acute Rehab Services [facility] [phone] Office [phone]    Almon Register 06/03/2022, 3:11 PM

## 2022-06-03 NOTE — Transfer of Care (Signed)
Immediate Anesthesia Transfer of Care Note  Patient: [name]  Procedure(s) Performed: REVERSE SHOULDER ARTHROPLASTY (Right: Shoulder)  Patient Location: PACU  Anesthesia Type:General  Level of Consciousness: awake  Airway & Oxygen Therapy: Patient Spontanous Breathing  Post-op Assessment: Report given to RN and Post -op Vital signs reviewed and stable  Post vital signs: Reviewed and stable  Last Vitals:  Vitals Value Taken Time  BP 96/37 06/03/22 0957  Temp    Pulse 79 06/03/22 0958  Resp 21 06/03/22 0958  SpO2 96 % 06/03/22 0958  Vitals shown include unvalidated device data.  Last Pain:  Vitals:   06/03/22 0629  TempSrc: Oral  PainSc:          Complications: No notable events documented.

## 2022-06-03 NOTE — Op Note (Signed)
06/03/2022  9:47 AM  PATIENT:   [name]  71 y.o. female  PRE-OPERATIVE DIAGNOSIS:  Right shoulder rotator cuff tear arthropathy  POST-OPERATIVE DIAGNOSIS: Same  PROCEDURE: Right shoulder reverse arthroplasty utilizing a press-fit size 5 modular Arthrex stem with a neutral metaphysis, +3 constrained polyethylene insert, 36/+4 glenosphere and a small/+4 baseplate  SURGEON:  Kaisley Stiverson, Metta Clines M.D.  ASSISTANTS: Jenetta Loges, PA-C  ANESTHESIA:   General endotracheal and interscalene block with Exparel  EBL: 200 cc  SPECIMEN: None  Drains: None   PATIENT DISPOSITION:  PACU - hemodynamically stable.    PLAN OF CARE: Discharge to home after PACU  Brief history:  Patient is a 71 year old female with chronic and progressively increasing right shoulder pain related to severe rotator cuff tear arthropathy.  Additionally she has significant underlying medical comorbidities including renal failure on dialysis with her dialysis shunt in the right upper extremity.  Due to her increasing pain and functional limitations and failure to respond to prolonged attempts at conservative management, she is brought to the operating room at this time for planned right shoulder reverse arthroplasty.  Preoperatively, I counseled the patient regarding treatment options and risks versus benefits thereof.  Possible surgical complications were all reviewed including potential for bleeding, infection, neurovascular injury, persistent pain, loss of motion, anesthetic complication, failure of the implant, and possible need for additional surgery. They understand and accept and agrees with our planned procedure.   Procedure in detail:  After undergoing routine preop evaluation the patient received prophylactic antibiotics and interscalene block with Exparel was established in the holding area by the anesthesia department.  Subsequently placed spine on the operating table and underwent the smooth induction of  a general endotracheal anesthesia.  Placed into the beachchair position and appropriately padded and protected.  The right shoulder girdle region was sterilely prepped and draped in standard fashion.  Timeout was called.  A deltopectoral approach was made to the right shoulder through an approximately 10 cm incision.  We then identified the cephalic vein and given the fact that her dialysis shunt is located in the right upper extremity this was moderately enlarged and we did take the vein medially and suture-ligated the muscular penetrator's such that the vein could be freely mobilized medially and the deltopectoral interval was then ultimately developed from proximal to distal.  Adhesions divided beneath the deltoid and retractors were placed.  The conjoined tendon was mobilized and retracted medially.  There was significant deformity noted with the humeral head being high riding and medially eroded beneath the coracoid.  The subscapularis was then separated from the lesser tuberosity using electrocautery and the margin was then tagged and the humeral head was then delivered to the wound after adhesions were divided on the anterior and infra margins of the humeral neck allowing delivery of the humeral head through the wound.  An extra medullary guide was then used to perform our humeral head resection at approximate 20 degrees retroversion.  A metal cap was then placed over the cut proximal humeral surface and the glenoid was then exposed with appropriate retractors.  Again there had been significant medialization of the glenoid with an acetabular rotation such that it eroded into the base of the coracoid.  We carefully dissected the soft tissues from the margin of the glenoid and identified the central starting point for guidepin which was then introduced and the glenoid was then reamed using the central followed by the peripheral reamers.  Preparation completed with the central drill  and tap for a 25 mm lag  screw.  Our baseplate was then assembled and was inserted using standard technique with excellent purchase and fixation.  All the peripheral locking screws were then placed using standard technique.  Good purchase was achieved.  A 36/+4 glenosphere was then impacted onto the baseplate and the central locking screw was placed.  Attention then returned back to the humeral metaphysis where the canal was opened and the bone was noted to be significantly sclerotic with a relatively small canal and we ultimately were able to broached to a size 5 modular stem and a neutral reaming guide was then used to prepare the metaphysis.  Trial reduction then performed showing good motion good stability and good soft tissue balance.  This point the trial was then removed the final implant was assembled the canal was cleaned and dried and vancomycin powder was then placed liberally into the canal implant was then seated and final reduction was then performed and ultimately felt that a +3 constrained poly gave is the best motion stability and soft tissue balance.  The trial poly was removed final poly was then impacted and final reduction was then performed again this showed good motion good stability good soft tissue balance all much to our satisfaction.  The wound was then copiously irrigated.  The subscapularis was mobilized and repaired back to the eyelets on the collar of the stem using the previously placed suture tape sutures.  The balance of the vancomycin powder was then spread liberally throughout the deep soft tissue planes.  The deltopectoral interval was then reapproximated with a series of figure-of-eight and 1 Vicryl sutures.  2-0 Monocryl used to close the subcu layer and intracuticular 3 Monocryl used to close the skin followed by Dermabond and Aquacel dressing.  Right arm placed into a sling and the patient was awakened, extubated, and taken to the recovery room in stable condition.  Jenetta Loges, PA-C was utilized  as an Environmental consultant throughout this case, essential for help with positioning the patient, positioning extremity, tissue manipulation, implantation of the prosthesis, suture management, wound closure, and intraoperative decision-making.  Marin Shutter MD   Contact # [phone]

## 2022-06-03 NOTE — Discharge Instructions (Signed)
? ?[name], M.D., F.A.A.O.S. ?Orthopaedic Surgery ?Specializing in Arthroscopic and Reconstructive ?Surgery of the Shoulder ?[phone] ?[address] - [place], [place] [address] - Fax [phone] ? ? ?POST-OP TOTAL SHOULDER REPLACEMENT INSTRUCTIONS ? ?1. Follow up in the office for your first post-op appointment 10-14 days from the date of your surgery. If you do not already have a scheduled appointment, our office will contact you to schedule. ? ?2. The bandage over your incision is waterproof. You may begin showering with this dressing on. You may leave this dressing on until first follow up appointment within 2 weeks. We prefer you leave this dressing in place until follow up however after 5-7 days if you are having itching or skin irritation and would like to remove it you may do so. Go slow and tug at the borders gently to break the bond the dressing has with the skin. At this point if there is no drainage it is okay to go without a bandage or you may cover it with a light guaze and tape. You can also expect significant bruising around your shoulder that will drift down your arm and into your chest wall. This is very normal and should resolve over several days. ? ? 3. Wear your sling/immobilizer at all times except to perform the exercises below or to occasionally let your arm dangle by your side to stretch your elbow. You also need to sleep in your sling immobilizer until instructed otherwise. It is ok to remove your sling if you are sitting in a controlled environment and allow your arm to rest in a position of comfort by your side or on your lap with pillows to give your neck and skin a break from the sling. You may remove it to allow arm to dangle by side to shower. If you are up walking around and when you go to sleep at night you need to wear it. ? ?4. Range of motion to your elbow, wrist, and hand are encouraged 3-5 times daily. Exercise to your hand and fingers helps to reduce  swelling you may experience. ? ? ?5. Prescriptions for a pain medication and a muscle relaxant are provided for you. It is recommended that if you are experiencing pain that you pain medication alone is not controlling, add the muscle relaxant along with the pain medication which can give additional pain relief. The first 1-2 days is generally the most severe of your pain and then should gradually decrease. As your pain lessens it is recommended that you decrease your use of the pain medications to an "as needed basis'" only and to always comply with the recommended dosages of the pain medications. ? ?6. Pain medications can produce constipation along with their use. If you experience this, the use of an over the counter stool softener or laxative daily is recommended.  ? ?7. For additional questions or concerns, please do not hesitate to call the office. If after hours there is an answering service to forward your concerns to the physician on call. ? ?8.Pain control following an exparel block ? ?To help control your post-operative pain you received a nerve block  performed with Exparel which is a long acting anesthetic (numbing agent) which can provide pain relief and sensations of numbness (and relief of pain) in the operative shoulder and arm for up to 3 days. Sometimes it provides mixed relief, meaning you may still have numbness in certain areas of the arm but can still be able to   move  parts of that arm, hand, and fingers. We recommend that your prescribed pain medications  be used as needed. We do not feel it is necessary to "pre medicate" and "stay ahead" of pain.  Taking narcotic pain medications when you are not having any pain can lead to unnecessary and potentially dangerous side effects.   ? ?9. Use the ice machine as much as possible in the first 5-7 days from surgery, then you can wean its use to as needed. The ice typically needs to be replaced every 6 hours, instead of ice you can actually freeze  water bottles to put in the cooler and then fill water around them to avoid having to purchase ice. You can have spare water bottles freezing to allow you to rotate them once they have melted. Try to have a thin shirt or light cloth or towel under the ice wrap to protect your skin.  ? ?FOR ADDITIONAL INFO ON ICE MACHINE AND INSTRUCTIONS GO TO THE WEBSITE AT ? ?https://www.djogl[employer].com/products/donjoy/donjoy-iceman-classic3 ? ?10.  We recommend that you avoid any dental work or cleaning in the first 3 months following your joint replacement. This is to help minimize the possibility of infection from the bacteria in your mouth that enters your bloodstream during dental work. We also recommend that you take an antibiotic prior to your dental work for the first year after your shoulder replacement to further help reduce that risk. Please simply contact our office for antibiotics to be sent to your pharmacy prior to dental work. ? ?11. Dental Antibiotics: ? ?We recommend waiting at least 3 months for any dental work even cleanings unless there is a dental emergency. We also recommend  prophylactic antibiotics for all dental procdeures  the first year following your joint replacement. In some exceptions we recommend them to be used lifelong. We will provide you with that prescription in follow up office visits, or you can call our office. ? ?Exceptions are as follows: ? ?1. History of prior total joint infection ? ?2. Severely immunocompromised (Organ Transplant, cancer chemotherapy, Rheumatoid biologic ?meds such as Humera) ? ?3. Poorly controlled diabetes (A1C &gt; 8.0, blood glucose over 200) ? ? ?POST-OP EXERCISES ? ?Pendulum Exercises ? ?Perform pendulum exercises while standing and bending at the waist. Support your uninvolved arm on a table or chair and allow your operated arm to hang freely. Make sure to do these exercises passively - not using you shoulder muscles. These exercises can be performed once your  nerve block effects have worn off. ? ?Repeat 20 times. Do 3 sessions per day. ? ? ?  ?

## 2022-06-03 NOTE — Care Plan (Signed)
Ortho Bundle Case Management Note  Patient Details  Name: [name] MRN: [id number] Date of Birth: [birth year]  R Rev TSA on 06-03-22 DCP:  Home with sister DME:  No needs, sling and CTU provided by hosp PT:  [facility] at 4 wk p/o.                   DME Arranged:  N/A DME Agency:  NA  HH Arranged:  NA HH Agency:  NA  Additional Comments: Please contact me with any questions of if this plan should need to change.  Marianne Sofia, RN,CCM [facility]  [phone] 06/03/2022, 7:50 AM

## 2022-06-04 ENCOUNTER — Encounter (HOSPITAL_COMMUNITY): Payer: Self-pay | Admitting: Orthopedic Surgery

## 2022-06-05 DIAGNOSIS — R519 Headache, unspecified: Secondary | ICD-10-CM | POA: Diagnosis not present

## 2022-06-05 DIAGNOSIS — Z992 Dependence on renal dialysis: Secondary | ICD-10-CM | POA: Diagnosis not present

## 2022-06-05 DIAGNOSIS — G8929 Other chronic pain: Secondary | ICD-10-CM | POA: Diagnosis not present

## 2022-06-05 DIAGNOSIS — N186 End stage renal disease: Secondary | ICD-10-CM | POA: Diagnosis not present

## 2022-06-05 DIAGNOSIS — N2581 Secondary hyperparathyroidism of renal origin: Secondary | ICD-10-CM | POA: Diagnosis not present

## 2022-06-05 DIAGNOSIS — R52 Pain, unspecified: Secondary | ICD-10-CM | POA: Diagnosis not present

## 2022-06-06 ENCOUNTER — Encounter: Payer: Self-pay | Admitting: Internal Medicine

## 2022-06-06 DIAGNOSIS — E669 Obesity, unspecified: Secondary | ICD-10-CM | POA: Insufficient documentation

## 2022-06-06 NOTE — Assessment & Plan Note (Signed)
Uncontrolled now back on home diet after glimeparide stopped at last hosp d/c; ok for restart OHA but I was hoping she would try rybelsus 3 mg if ok with insurance due to need for wt loss as well

## 2022-06-06 NOTE — Assessment & Plan Note (Signed)
Also for surgury in AM, to f/u any worsening symptoms or concerns

## 2022-06-08 ENCOUNTER — Other Ambulatory Visit (HOSPITAL_COMMUNITY): Payer: Self-pay

## 2022-06-08 DIAGNOSIS — R519 Headache, unspecified: Secondary | ICD-10-CM | POA: Diagnosis not present

## 2022-06-08 DIAGNOSIS — G8929 Other chronic pain: Secondary | ICD-10-CM | POA: Diagnosis not present

## 2022-06-08 DIAGNOSIS — Z992 Dependence on renal dialysis: Secondary | ICD-10-CM | POA: Diagnosis not present

## 2022-06-08 DIAGNOSIS — N2581 Secondary hyperparathyroidism of renal origin: Secondary | ICD-10-CM | POA: Diagnosis not present

## 2022-06-08 DIAGNOSIS — N186 End stage renal disease: Secondary | ICD-10-CM | POA: Diagnosis not present

## 2022-06-08 DIAGNOSIS — R52 Pain, unspecified: Secondary | ICD-10-CM | POA: Diagnosis not present

## 2022-06-08 MED ORDER — OXYCODONE HCL 15 MG PO TABS
15.0000 mg | ORAL_TABLET | Freq: Four times a day (QID) | ORAL | 0 refills | Status: DC | PRN
  Filled 2022-06-08: qty 3, 1d supply, fill #0
  Filled 2022-06-08: qty 117, 29d supply, fill #0

## 2022-06-09 DIAGNOSIS — Z96611 Presence of right artificial shoulder joint: Secondary | ICD-10-CM | POA: Insufficient documentation

## 2022-06-10 ENCOUNTER — Other Ambulatory Visit (HOSPITAL_COMMUNITY): Payer: Self-pay

## 2022-06-10 DIAGNOSIS — N186 End stage renal disease: Secondary | ICD-10-CM | POA: Diagnosis not present

## 2022-06-10 DIAGNOSIS — N2581 Secondary hyperparathyroidism of renal origin: Secondary | ICD-10-CM | POA: Diagnosis not present

## 2022-06-10 DIAGNOSIS — Z992 Dependence on renal dialysis: Secondary | ICD-10-CM | POA: Diagnosis not present

## 2022-06-10 DIAGNOSIS — R52 Pain, unspecified: Secondary | ICD-10-CM | POA: Diagnosis not present

## 2022-06-10 DIAGNOSIS — G8929 Other chronic pain: Secondary | ICD-10-CM | POA: Diagnosis not present

## 2022-06-10 DIAGNOSIS — R519 Headache, unspecified: Secondary | ICD-10-CM | POA: Diagnosis not present

## 2022-06-11 DIAGNOSIS — N186 End stage renal disease: Secondary | ICD-10-CM | POA: Diagnosis not present

## 2022-06-11 DIAGNOSIS — E1022 Type 1 diabetes mellitus with diabetic chronic kidney disease: Secondary | ICD-10-CM | POA: Diagnosis not present

## 2022-06-11 DIAGNOSIS — Z992 Dependence on renal dialysis: Secondary | ICD-10-CM | POA: Diagnosis not present

## 2022-06-12 DIAGNOSIS — R52 Pain, unspecified: Secondary | ICD-10-CM | POA: Diagnosis not present

## 2022-06-12 DIAGNOSIS — E1129 Type 2 diabetes mellitus with other diabetic kidney complication: Secondary | ICD-10-CM | POA: Diagnosis not present

## 2022-06-12 DIAGNOSIS — N2581 Secondary hyperparathyroidism of renal origin: Secondary | ICD-10-CM | POA: Diagnosis not present

## 2022-06-12 DIAGNOSIS — D631 Anemia in chronic kidney disease: Secondary | ICD-10-CM | POA: Diagnosis not present

## 2022-06-12 DIAGNOSIS — R519 Headache, unspecified: Secondary | ICD-10-CM | POA: Diagnosis not present

## 2022-06-12 DIAGNOSIS — N186 End stage renal disease: Secondary | ICD-10-CM | POA: Diagnosis not present

## 2022-06-12 DIAGNOSIS — Z992 Dependence on renal dialysis: Secondary | ICD-10-CM | POA: Diagnosis not present

## 2022-06-12 DIAGNOSIS — G8929 Other chronic pain: Secondary | ICD-10-CM | POA: Diagnosis not present

## 2022-06-15 DIAGNOSIS — E1129 Type 2 diabetes mellitus with other diabetic kidney complication: Secondary | ICD-10-CM | POA: Diagnosis not present

## 2022-06-15 DIAGNOSIS — Z992 Dependence on renal dialysis: Secondary | ICD-10-CM | POA: Diagnosis not present

## 2022-06-15 DIAGNOSIS — R52 Pain, unspecified: Secondary | ICD-10-CM | POA: Diagnosis not present

## 2022-06-15 DIAGNOSIS — G8929 Other chronic pain: Secondary | ICD-10-CM | POA: Diagnosis not present

## 2022-06-15 DIAGNOSIS — R519 Headache, unspecified: Secondary | ICD-10-CM | POA: Diagnosis not present

## 2022-06-15 DIAGNOSIS — N2581 Secondary hyperparathyroidism of renal origin: Secondary | ICD-10-CM | POA: Diagnosis not present

## 2022-06-15 DIAGNOSIS — D631 Anemia in chronic kidney disease: Secondary | ICD-10-CM | POA: Diagnosis not present

## 2022-06-15 DIAGNOSIS — N186 End stage renal disease: Secondary | ICD-10-CM | POA: Diagnosis not present

## 2022-06-16 ENCOUNTER — Other Ambulatory Visit: Payer: Self-pay | Admitting: Internal Medicine

## 2022-06-16 MED ORDER — GLIMEPIRIDE 1 MG PO TABS: 1.0000 mg | ORAL_TABLET | Freq: Every day | ORAL | 3 refills | Status: DC

## 2022-06-17 DIAGNOSIS — Z96611 Presence of right artificial shoulder joint: Secondary | ICD-10-CM | POA: Diagnosis not present

## 2022-06-17 DIAGNOSIS — R52 Pain, unspecified: Secondary | ICD-10-CM | POA: Diagnosis not present

## 2022-06-17 DIAGNOSIS — E1129 Type 2 diabetes mellitus with other diabetic kidney complication: Secondary | ICD-10-CM | POA: Diagnosis not present

## 2022-06-17 DIAGNOSIS — Z992 Dependence on renal dialysis: Secondary | ICD-10-CM | POA: Diagnosis not present

## 2022-06-17 DIAGNOSIS — N186 End stage renal disease: Secondary | ICD-10-CM | POA: Diagnosis not present

## 2022-06-17 DIAGNOSIS — R519 Headache, unspecified: Secondary | ICD-10-CM | POA: Diagnosis not present

## 2022-06-17 DIAGNOSIS — N2581 Secondary hyperparathyroidism of renal origin: Secondary | ICD-10-CM | POA: Diagnosis not present

## 2022-06-17 DIAGNOSIS — G8929 Other chronic pain: Secondary | ICD-10-CM | POA: Diagnosis not present

## 2022-06-17 DIAGNOSIS — D631 Anemia in chronic kidney disease: Secondary | ICD-10-CM | POA: Diagnosis not present

## 2022-06-17 MED ORDER — LIDOCAINE 5 % EX PTCH: 2.0000 | MEDICATED_PATCH | CUTANEOUS | 1 refills | Status: DC

## 2022-06-19 DIAGNOSIS — R52 Pain, unspecified: Secondary | ICD-10-CM | POA: Diagnosis not present

## 2022-06-19 DIAGNOSIS — E1129 Type 2 diabetes mellitus with other diabetic kidney complication: Secondary | ICD-10-CM | POA: Diagnosis not present

## 2022-06-19 DIAGNOSIS — Z992 Dependence on renal dialysis: Secondary | ICD-10-CM | POA: Diagnosis not present

## 2022-06-19 DIAGNOSIS — N2581 Secondary hyperparathyroidism of renal origin: Secondary | ICD-10-CM | POA: Diagnosis not present

## 2022-06-19 DIAGNOSIS — N186 End stage renal disease: Secondary | ICD-10-CM | POA: Diagnosis not present

## 2022-06-19 DIAGNOSIS — G8929 Other chronic pain: Secondary | ICD-10-CM | POA: Diagnosis not present

## 2022-06-19 DIAGNOSIS — R519 Headache, unspecified: Secondary | ICD-10-CM | POA: Diagnosis not present

## 2022-06-19 DIAGNOSIS — D631 Anemia in chronic kidney disease: Secondary | ICD-10-CM | POA: Diagnosis not present

## 2022-06-21 ENCOUNTER — Telehealth: Payer: Self-pay | Admitting: *Deleted

## 2022-06-21 DIAGNOSIS — E7800 Pure hypercholesterolemia, unspecified: Secondary | ICD-10-CM

## 2022-06-21 NOTE — Telephone Encounter (Signed)
Patient made aware of results and verbalized understanding.  The patient will check prices with the pharmacy and insurance and call back    Sanda Klein, MD  05/16/2022  3:20 PM EDT Back to Top    LDL cholesterol is high.  She has not tolerated statin therapy in the past. Would at least try treatment with ezetimibe, although this may not be strong enough to get to [place]. Would also explore whether Nexlezet is a covered and affordable alternative, since this will get [place] closer to target

## 2022-06-22 DIAGNOSIS — R519 Headache, unspecified: Secondary | ICD-10-CM | POA: Diagnosis not present

## 2022-06-22 DIAGNOSIS — N2581 Secondary hyperparathyroidism of renal origin: Secondary | ICD-10-CM | POA: Diagnosis not present

## 2022-06-22 DIAGNOSIS — N186 End stage renal disease: Secondary | ICD-10-CM | POA: Diagnosis not present

## 2022-06-22 DIAGNOSIS — G8929 Other chronic pain: Secondary | ICD-10-CM | POA: Diagnosis not present

## 2022-06-22 DIAGNOSIS — E1129 Type 2 diabetes mellitus with other diabetic kidney complication: Secondary | ICD-10-CM | POA: Diagnosis not present

## 2022-06-22 DIAGNOSIS — Z992 Dependence on renal dialysis: Secondary | ICD-10-CM | POA: Diagnosis not present

## 2022-06-22 DIAGNOSIS — D631 Anemia in chronic kidney disease: Secondary | ICD-10-CM | POA: Diagnosis not present

## 2022-06-22 DIAGNOSIS — R52 Pain, unspecified: Secondary | ICD-10-CM | POA: Diagnosis not present

## 2022-06-23 ENCOUNTER — Ambulatory Visit: Payer: Medicare Other | Admitting: Internal Medicine

## 2022-06-24 DIAGNOSIS — R519 Headache, unspecified: Secondary | ICD-10-CM | POA: Diagnosis not present

## 2022-06-24 DIAGNOSIS — G8929 Other chronic pain: Secondary | ICD-10-CM | POA: Diagnosis not present

## 2022-06-24 DIAGNOSIS — D631 Anemia in chronic kidney disease: Secondary | ICD-10-CM | POA: Diagnosis not present

## 2022-06-24 DIAGNOSIS — R52 Pain, unspecified: Secondary | ICD-10-CM | POA: Diagnosis not present

## 2022-06-24 DIAGNOSIS — Z992 Dependence on renal dialysis: Secondary | ICD-10-CM | POA: Diagnosis not present

## 2022-06-24 DIAGNOSIS — E1129 Type 2 diabetes mellitus with other diabetic kidney complication: Secondary | ICD-10-CM | POA: Diagnosis not present

## 2022-06-24 DIAGNOSIS — N186 End stage renal disease: Secondary | ICD-10-CM | POA: Diagnosis not present

## 2022-06-24 DIAGNOSIS — N2581 Secondary hyperparathyroidism of renal origin: Secondary | ICD-10-CM | POA: Diagnosis not present

## 2022-06-25 ENCOUNTER — Encounter: Payer: Self-pay | Admitting: Internal Medicine

## 2022-06-25 ENCOUNTER — Ambulatory Visit (INDEPENDENT_AMBULATORY_CARE_PROVIDER_SITE_OTHER): Payer: Medicare Other | Admitting: Internal Medicine

## 2022-06-25 DIAGNOSIS — K559 Vascular disorder of intestine, unspecified: Secondary | ICD-10-CM | POA: Diagnosis not present

## 2022-06-25 DIAGNOSIS — E1129 Type 2 diabetes mellitus with other diabetic kidney complication: Secondary | ICD-10-CM | POA: Diagnosis not present

## 2022-06-25 DIAGNOSIS — I10 Essential (primary) hypertension: Secondary | ICD-10-CM | POA: Diagnosis not present

## 2022-06-25 DIAGNOSIS — F419 Anxiety disorder, unspecified: Secondary | ICD-10-CM | POA: Diagnosis not present

## 2022-06-25 MED ORDER — CICLOPIROX 8 % EX SOLN: Freq: Every day | CUTANEOUS | 1 refills | Status: DC

## 2022-06-25 NOTE — Assessment & Plan Note (Signed)
Well-controlled BP on hemodialysis.

## 2022-06-25 NOTE — Assessment & Plan Note (Signed)
No relapse 

## 2022-06-25 NOTE — Assessment & Plan Note (Signed)
Lorazepam prn  Potential benefits of a long term benzodiazepines  use as well as potential risks  and complications were explained to the patient and were aknowledged.  

## 2022-06-25 NOTE — Assessment & Plan Note (Signed)
Cont on Actos, glimepiride. Better

## 2022-06-25 NOTE — Progress Notes (Signed)
Subjective:  Patient ID: [name], female    DOB: [birth year]  Age: 71 y.o. MRN: [id number]  CC: Follow-up (3 month follow up)   HPI [name] presents for ESRD on HD, DM, HTN S/p R shoulder replacement by Dr Rennis Chris  Outpatient Medications Prior to Visit  Medication Sig Dispense Refill   Accu-Chek Softclix Lancets lancets Use 1 lancet up to 4 times daily as directed 100 each 0   acetaminophen (TYLENOL) 500 MG tablet Take 1,000 mg by mouth 2 (two) times daily as needed for headache.     acidophilus (RISAQUAD) CAPS capsule Take 1 capsule by mouth daily.     aspirin-acetaminophen-caffeine (EXCEDRIN MIGRAINE) 250-250-65 MG tablet Take 1 tablet by mouth daily as needed for headache or migraine. Max 3 doses in one week     B Complex-C-Folic Acid (RENA-VITE PO) Take 1 tablet by mouth every morning.     blood glucose meter kit and supplies Dispense based on patient and insurance preference. Used to check blood sugar daily, DX: E11.9 OneTouch 1 each 0   Blood Glucose Monitoring Suppl (ACCU-CHEK GUIDE ME) w/Device KIT Use as directed to check blood sugar four times daily. 1 kit 0   buPROPion (WELLBUTRIN XL) 300 MG 24 hr tablet Take 1 tablet (300 mg total) by mouth in the morning. 90 tablet 1   carboxymethylcellulose (REFRESH PLUS) 0.5 % SOLN Place 1 drop into both eyes 3 (three) times daily as needed (dry eyes/irritation).     cyclobenzaprine (FLEXERIL) 10 MG tablet Take 1 tablet (10 mg total) by mouth 3 (three) times daily as needed for muscle spasms. 270 tablet 0   ferric citrate (AURYXIA) 1 GM 210 MG(Fe) tablet Take 210 mg by mouth 3 (three) times daily with meals.     FLUoxetine (PROZAC) 20 MG capsule Take 1 capsule (20 mg total) by mouth in the morning. 90 capsule 1   glimepiride (AMARYL) 1 MG tablet Take 1 tablet (1 mg total) by mouth daily with breakfast. 90 tablet 3   glucose blood (ACCU-CHEK GUIDE) test strip Use to check blood sugar up to four times daily. 50 each 0   glucose  blood (TRUETEST TEST) test strip Used to check blood sugar daily, DX: E11.9 100 each 3   ketoconazole (NIZORAL) 2 % cream Apply 1 application  topically daily.     lidocaine (LIDODERM) 5 % Place 2 patches onto the skin daily. 60 patch 1   lidocaine (XYLOCAINE) 5 % ointment Apply 1 application topically 3 (three) times daily as needed for moderate pain. 90 g 0   lidocaine-prilocaine (EMLA) cream Apply 1 application topically See admin instructions. Apply topically to port access one hour prior to dialysis, cover with plastic- Tuesday, Thursday, Saturday     LORazepam (ATIVAN) 2 MG tablet Take 1 tablet (2 mg total) by mouth 2 (two) times daily as needed for anxiety 180 tablet 1   midodrine (PROAMATINE) 5 MG tablet Take 1-2 tablet by mouth three times a week as directed Take 30 minutes before dialysis and another tablet during middle of dialysis treatment (Patient taking differently: Take 5 mg by mouth See admin instructions. Take 5 mg by mouth at 5:30 am  and 8:30 am on Tuesday, Thursday and Saturday.) 90 tablet 3   multivitamin-lutein (OCUVITE-LUTEIN) CAPS capsule Take 1 capsule by mouth every morning.     naloxone (NARCAN) nasal spray 4 mg/0.1 mL Take by nasal route every 3 minutes until patient awakes or EMS arrives. 2 each  0   ondansetron (ZOFRAN) 4 MG tablet Take 1 tablet (4 mg total) by mouth every 8 (eight) hours as needed for nausea or vomiting. 10 tablet 0   oxyCODONE (ROXICODONE) 15 MG immediate release tablet Take 1 tablet (15 mg total) by mouth 4 (four) times daily as needed. 120 tablet 0   pantoprazole (PROTONIX) 40 MG tablet TAKE 1 TABLET BY MOUTH  DAILY 90 tablet 3   pilocarpine (SALAGEN) 5 MG tablet TAKE 1 TABLET BY MOUTH  TWICE DAILY (Patient taking differently: Take 5 mg by mouth daily.) 180 tablet 2   pioglitazone (ACTOS) 15 MG tablet TAKE 1 TABLET BY MOUTH  DAILY 90 tablet 3   polyethylene glycol (MIRALAX / GLYCOLAX) 17 g packet Take 17 g by mouth every 7 (seven) days.     PREVIDENT  5000 DRY MOUTH 1.1 % GEL dental gel Place 1 application  onto teeth in the morning and at bedtime.     HYDROmorphone (DILAUDID) 2 MG tablet Take 1 tablet (2 mg total) by mouth every 4 (four) hours as needed. (Patient not taking: Reported on 06/25/2022) 30 tablet 0   No facility-administered medications prior to visit.    ROS: Review of Systems  Constitutional:  Positive for fatigue. Negative for activity change, appetite change, chills and unexpected weight change.  HENT:  Negative for congestion, mouth sores and sinus pressure.   Eyes:  Negative for visual disturbance.  Respiratory:  Negative for cough and chest tightness.   Gastrointestinal:  Negative for abdominal pain and nausea.  Genitourinary:  Negative for difficulty urinating, frequency and vaginal pain.  Musculoskeletal:  Positive for arthralgias, back pain and gait problem.  Skin:  Negative for pallor and rash.  Neurological:  Negative for dizziness, tremors, weakness, numbness and headaches.  Psychiatric/Behavioral:  Negative for confusion, sleep disturbance and suicidal ideas. The patient is nervous/anxious.     Objective:  BP 128/76 (BP Location: Left Arm, Patient Position: Sitting, Cuff Size: Large)   Pulse 80   Temp 98.2 F (36.8 C) (Oral)   Ht $R'5\' 2"'or$  (1.575 m)   Wt 212 lb 12.8 oz (96.5 kg)   SpO2 97%   BMI 38.92 kg/m   BP Readings from Last 3 Encounters:  06/25/22 128/76  06/03/22 (!) 91/44  06/02/22 118/60    Wt Readings from Last 3 Encounters:  06/25/22 212 lb 12.8 oz (96.5 kg)  06/03/22 211 lb 6.4 oz (95.9 kg)  06/02/22 211 lb 6.4 oz (95.9 kg)    Physical Exam Constitutional:      General: She is not in acute distress.    Appearance: She is well-developed. She is obese.  HENT:     Head: Normocephalic.     Right Ear: External ear normal.     Left Ear: External ear normal.     Nose: Nose normal.  Eyes:     General:        Right eye: No discharge.        Left eye: No discharge.      Conjunctiva/sclera: Conjunctivae normal.     Pupils: Pupils are equal, round, and reactive to light.  Neck:     Thyroid: No thyromegaly.     Vascular: No JVD.     Trachea: No tracheal deviation.  Cardiovascular:     Rate and Rhythm: Normal rate and regular rhythm.     Heart sounds: Normal heart sounds.  Pulmonary:     Effort: No respiratory distress.     Breath sounds: No  stridor. No wheezing.  Abdominal:     General: Bowel sounds are normal. There is no distension.     Palpations: Abdomen is soft. There is no mass.     Tenderness: There is no abdominal tenderness. There is no guarding or rebound.  Musculoskeletal:        General: No tenderness.     Cervical back: Normal range of motion and neck supple. No rigidity.  Lymphadenopathy:     Cervical: No cervical adenopathy.  Skin:    Findings: No erythema or rash.  Neurological:     Mental Status: She is oriented to person, place, and time.     Cranial Nerves: No cranial nerve deficit.     Motor: No abnormal muscle tone.     Coordination: Coordination normal.     Gait: Gait abnormal.     Deep Tendon Reflexes: Reflexes normal.  Psychiatric:        Behavior: Behavior normal.        Thought Content: Thought content normal.        Judgment: Judgment normal.   Antalgic gait, using a pole R shoulder w/pain  Lab Results  Component Value Date   WBC 10.6 (H) 06/03/2022   HGB 10.5 (L) 06/03/2022   HCT 33.1 (L) 06/03/2022   PLT 185 06/03/2022   GLUCOSE 163 (H) 06/03/2022   CHOL 211 (H) 05/14/2022   TRIG 118 05/14/2022   HDL 55 05/14/2022   LDLCALC 135 (H) 05/14/2022   ALT 21 03/10/2022   AST 24 03/10/2022   NA 137 06/03/2022   K 3.5 06/03/2022   CL 92 (L) 06/03/2022   CREATININE 3.30 (H) 06/03/2022   BUN 34 (H) 06/03/2022   CO2 32 06/03/2022   TSH 3.98 04/16/2020   INR 1.1 03/10/2022   HGBA1C 6.3 (H) 03/10/2022    No results found.  Assessment & Plan:   Problem List Items Addressed This Visit     Anxiety disorder     Lorazepam prn  Potential benefits of a long term benzodiazepines  use as well as potential risks  and complications were explained to the patient and were aknowledged.      Essential hypertension    Well-controlled BP on hemodialysis.      Ischemic colitis ([place])    No relapse      Type 2 diabetes mellitus with other diabetic kidney complication ([name])    Cont on Actos, glimepiride. Better         Meds ordered this encounter  Medications   ciclopirox (PENLAC) 8 % solution    Sig: Apply topically at bedtime. Apply over nail and surrounding skin daily    Dispense:  6.6 mL    Refill:  1      Follow-up: Return in about 3 months (around 09/25/2022).  [name], MD

## 2022-06-26 DIAGNOSIS — N2581 Secondary hyperparathyroidism of renal origin: Secondary | ICD-10-CM | POA: Diagnosis not present

## 2022-06-26 DIAGNOSIS — Z992 Dependence on renal dialysis: Secondary | ICD-10-CM | POA: Diagnosis not present

## 2022-06-26 DIAGNOSIS — E1129 Type 2 diabetes mellitus with other diabetic kidney complication: Secondary | ICD-10-CM | POA: Diagnosis not present

## 2022-06-26 DIAGNOSIS — D631 Anemia in chronic kidney disease: Secondary | ICD-10-CM | POA: Diagnosis not present

## 2022-06-26 DIAGNOSIS — N186 End stage renal disease: Secondary | ICD-10-CM | POA: Diagnosis not present

## 2022-06-26 DIAGNOSIS — R519 Headache, unspecified: Secondary | ICD-10-CM | POA: Diagnosis not present

## 2022-06-26 DIAGNOSIS — G8929 Other chronic pain: Secondary | ICD-10-CM | POA: Diagnosis not present

## 2022-06-26 DIAGNOSIS — R52 Pain, unspecified: Secondary | ICD-10-CM | POA: Diagnosis not present

## 2022-06-28 MED ORDER — EZETIMIBE 10 MG PO TABS
10.0000 mg | ORAL_TABLET | Freq: Every day | ORAL | 0 refills | Status: DC
Start: 2022-06-28 — End: 2022-08-12

## 2022-06-28 NOTE — Telephone Encounter (Signed)
Pt called back stating she was told to call back after speaking with pharmacy and insurance regarding medication prices

## 2022-06-28 NOTE — Telephone Encounter (Signed)
The patient called back to let us know that the Nexlezet  would be $1400 for a 3 month supply and Repatha and Praluent would be $47 monthly.  She would like to try the Zetia first and recheck labs in 3 months. If they are still elevated then she may try Repatha if she can get assistance.

## 2022-06-29 DIAGNOSIS — R52 Pain, unspecified: Secondary | ICD-10-CM | POA: Diagnosis not present

## 2022-06-29 DIAGNOSIS — N2581 Secondary hyperparathyroidism of renal origin: Secondary | ICD-10-CM | POA: Diagnosis not present

## 2022-06-29 DIAGNOSIS — D631 Anemia in chronic kidney disease: Secondary | ICD-10-CM | POA: Diagnosis not present

## 2022-06-29 DIAGNOSIS — Z992 Dependence on renal dialysis: Secondary | ICD-10-CM | POA: Diagnosis not present

## 2022-06-29 DIAGNOSIS — R519 Headache, unspecified: Secondary | ICD-10-CM | POA: Diagnosis not present

## 2022-06-29 DIAGNOSIS — G8929 Other chronic pain: Secondary | ICD-10-CM | POA: Diagnosis not present

## 2022-06-29 DIAGNOSIS — E1129 Type 2 diabetes mellitus with other diabetic kidney complication: Secondary | ICD-10-CM | POA: Diagnosis not present

## 2022-06-29 DIAGNOSIS — N186 End stage renal disease: Secondary | ICD-10-CM | POA: Diagnosis not present

## 2022-06-30 DIAGNOSIS — M25511 Pain in right shoulder: Secondary | ICD-10-CM | POA: Diagnosis not present

## 2022-06-30 DIAGNOSIS — M8588 Other specified disorders of bone density and structure, other site: Secondary | ICD-10-CM | POA: Diagnosis not present

## 2022-07-01 ENCOUNTER — Ambulatory Visit: Payer: Self-pay | Admitting: *Deleted

## 2022-07-01 DIAGNOSIS — N186 End stage renal disease: Secondary | ICD-10-CM | POA: Diagnosis not present

## 2022-07-01 DIAGNOSIS — R519 Headache, unspecified: Secondary | ICD-10-CM | POA: Diagnosis not present

## 2022-07-01 DIAGNOSIS — N2581 Secondary hyperparathyroidism of renal origin: Secondary | ICD-10-CM | POA: Diagnosis not present

## 2022-07-01 DIAGNOSIS — R52 Pain, unspecified: Secondary | ICD-10-CM | POA: Diagnosis not present

## 2022-07-01 DIAGNOSIS — E1129 Type 2 diabetes mellitus with other diabetic kidney complication: Secondary | ICD-10-CM | POA: Diagnosis not present

## 2022-07-01 DIAGNOSIS — Z992 Dependence on renal dialysis: Secondary | ICD-10-CM | POA: Diagnosis not present

## 2022-07-01 DIAGNOSIS — D631 Anemia in chronic kidney disease: Secondary | ICD-10-CM | POA: Diagnosis not present

## 2022-07-01 DIAGNOSIS — G8929 Other chronic pain: Secondary | ICD-10-CM | POA: Diagnosis not present

## 2022-07-01 NOTE — Patient Outreach (Signed)
[facility]) Care Management  07/01/2022  [name] 01-Feb-1951 [id number]  RN Health Coach telephone call to patient.  [employer] compliance verified. RN described the reason for call to patient on the number of ED visit. The Patient stated she hasn't had that many visits and she didn't need the service. Patient declined services.  [facility] [phone]

## 2022-07-03 DIAGNOSIS — Z992 Dependence on renal dialysis: Secondary | ICD-10-CM | POA: Diagnosis not present

## 2022-07-03 DIAGNOSIS — N2581 Secondary hyperparathyroidism of renal origin: Secondary | ICD-10-CM | POA: Diagnosis not present

## 2022-07-03 DIAGNOSIS — R519 Headache, unspecified: Secondary | ICD-10-CM | POA: Diagnosis not present

## 2022-07-03 DIAGNOSIS — G8929 Other chronic pain: Secondary | ICD-10-CM | POA: Diagnosis not present

## 2022-07-03 DIAGNOSIS — N186 End stage renal disease: Secondary | ICD-10-CM | POA: Diagnosis not present

## 2022-07-03 DIAGNOSIS — D631 Anemia in chronic kidney disease: Secondary | ICD-10-CM | POA: Diagnosis not present

## 2022-07-03 DIAGNOSIS — E1129 Type 2 diabetes mellitus with other diabetic kidney complication: Secondary | ICD-10-CM | POA: Diagnosis not present

## 2022-07-03 DIAGNOSIS — R52 Pain, unspecified: Secondary | ICD-10-CM | POA: Diagnosis not present

## 2022-07-05 ENCOUNTER — Other Ambulatory Visit: Payer: Self-pay | Admitting: Internal Medicine

## 2022-07-06 ENCOUNTER — Other Ambulatory Visit (HOSPITAL_COMMUNITY): Payer: Self-pay

## 2022-07-06 DIAGNOSIS — D631 Anemia in chronic kidney disease: Secondary | ICD-10-CM | POA: Diagnosis not present

## 2022-07-06 DIAGNOSIS — R52 Pain, unspecified: Secondary | ICD-10-CM | POA: Diagnosis not present

## 2022-07-06 DIAGNOSIS — Z992 Dependence on renal dialysis: Secondary | ICD-10-CM | POA: Diagnosis not present

## 2022-07-06 DIAGNOSIS — G8929 Other chronic pain: Secondary | ICD-10-CM | POA: Diagnosis not present

## 2022-07-06 DIAGNOSIS — E1129 Type 2 diabetes mellitus with other diabetic kidney complication: Secondary | ICD-10-CM | POA: Diagnosis not present

## 2022-07-06 DIAGNOSIS — N2581 Secondary hyperparathyroidism of renal origin: Secondary | ICD-10-CM | POA: Diagnosis not present

## 2022-07-06 DIAGNOSIS — N186 End stage renal disease: Secondary | ICD-10-CM | POA: Diagnosis not present

## 2022-07-06 DIAGNOSIS — R519 Headache, unspecified: Secondary | ICD-10-CM | POA: Diagnosis not present

## 2022-07-06 MED ORDER — CYCLOBENZAPRINE HCL 10 MG PO TABS
10.0000 mg | ORAL_TABLET | Freq: Three times a day (TID) | ORAL | 0 refills | Status: DC | PRN
  Filled 2022-07-06: qty 270, 90d supply, fill #0

## 2022-07-06 MED ORDER — OXYCODONE HCL 15 MG PO TABS
15.0000 mg | ORAL_TABLET | Freq: Four times a day (QID) | ORAL | 0 refills | Status: DC | PRN
  Filled 2022-07-06: qty 120, 30d supply, fill #0

## 2022-07-07 DIAGNOSIS — M25511 Pain in right shoulder: Secondary | ICD-10-CM | POA: Diagnosis not present

## 2022-07-08 DIAGNOSIS — G8929 Other chronic pain: Secondary | ICD-10-CM | POA: Diagnosis not present

## 2022-07-08 DIAGNOSIS — N2581 Secondary hyperparathyroidism of renal origin: Secondary | ICD-10-CM | POA: Diagnosis not present

## 2022-07-08 DIAGNOSIS — E1129 Type 2 diabetes mellitus with other diabetic kidney complication: Secondary | ICD-10-CM | POA: Diagnosis not present

## 2022-07-08 DIAGNOSIS — R519 Headache, unspecified: Secondary | ICD-10-CM | POA: Diagnosis not present

## 2022-07-08 DIAGNOSIS — D631 Anemia in chronic kidney disease: Secondary | ICD-10-CM | POA: Diagnosis not present

## 2022-07-08 DIAGNOSIS — Z992 Dependence on renal dialysis: Secondary | ICD-10-CM | POA: Diagnosis not present

## 2022-07-08 DIAGNOSIS — N186 End stage renal disease: Secondary | ICD-10-CM | POA: Diagnosis not present

## 2022-07-08 DIAGNOSIS — R52 Pain, unspecified: Secondary | ICD-10-CM | POA: Diagnosis not present

## 2022-07-10 DIAGNOSIS — R52 Pain, unspecified: Secondary | ICD-10-CM | POA: Diagnosis not present

## 2022-07-10 DIAGNOSIS — D631 Anemia in chronic kidney disease: Secondary | ICD-10-CM | POA: Diagnosis not present

## 2022-07-10 DIAGNOSIS — E1129 Type 2 diabetes mellitus with other diabetic kidney complication: Secondary | ICD-10-CM | POA: Diagnosis not present

## 2022-07-10 DIAGNOSIS — R519 Headache, unspecified: Secondary | ICD-10-CM | POA: Diagnosis not present

## 2022-07-10 DIAGNOSIS — N186 End stage renal disease: Secondary | ICD-10-CM | POA: Diagnosis not present

## 2022-07-10 DIAGNOSIS — Z992 Dependence on renal dialysis: Secondary | ICD-10-CM | POA: Diagnosis not present

## 2022-07-10 DIAGNOSIS — G8929 Other chronic pain: Secondary | ICD-10-CM | POA: Diagnosis not present

## 2022-07-10 DIAGNOSIS — N2581 Secondary hyperparathyroidism of renal origin: Secondary | ICD-10-CM | POA: Diagnosis not present

## 2022-07-12 DIAGNOSIS — N186 End stage renal disease: Secondary | ICD-10-CM | POA: Diagnosis not present

## 2022-07-12 DIAGNOSIS — Z992 Dependence on renal dialysis: Secondary | ICD-10-CM | POA: Diagnosis not present

## 2022-07-12 DIAGNOSIS — E1022 Type 1 diabetes mellitus with diabetic chronic kidney disease: Secondary | ICD-10-CM | POA: Diagnosis not present

## 2022-07-13 DIAGNOSIS — Z992 Dependence on renal dialysis: Secondary | ICD-10-CM | POA: Diagnosis not present

## 2022-07-13 DIAGNOSIS — N186 End stage renal disease: Secondary | ICD-10-CM | POA: Diagnosis not present

## 2022-07-13 DIAGNOSIS — R519 Headache, unspecified: Secondary | ICD-10-CM | POA: Diagnosis not present

## 2022-07-13 DIAGNOSIS — D631 Anemia in chronic kidney disease: Secondary | ICD-10-CM | POA: Diagnosis not present

## 2022-07-13 DIAGNOSIS — N2581 Secondary hyperparathyroidism of renal origin: Secondary | ICD-10-CM | POA: Diagnosis not present

## 2022-07-13 DIAGNOSIS — G8929 Other chronic pain: Secondary | ICD-10-CM | POA: Diagnosis not present

## 2022-07-14 DIAGNOSIS — M25511 Pain in right shoulder: Secondary | ICD-10-CM | POA: Diagnosis not present

## 2022-07-15 DIAGNOSIS — R519 Headache, unspecified: Secondary | ICD-10-CM | POA: Diagnosis not present

## 2022-07-15 DIAGNOSIS — D631 Anemia in chronic kidney disease: Secondary | ICD-10-CM | POA: Diagnosis not present

## 2022-07-15 DIAGNOSIS — N186 End stage renal disease: Secondary | ICD-10-CM | POA: Diagnosis not present

## 2022-07-15 DIAGNOSIS — G8929 Other chronic pain: Secondary | ICD-10-CM | POA: Diagnosis not present

## 2022-07-15 DIAGNOSIS — N2581 Secondary hyperparathyroidism of renal origin: Secondary | ICD-10-CM | POA: Diagnosis not present

## 2022-07-15 DIAGNOSIS — Z992 Dependence on renal dialysis: Secondary | ICD-10-CM | POA: Diagnosis not present

## 2022-07-17 DIAGNOSIS — Z992 Dependence on renal dialysis: Secondary | ICD-10-CM | POA: Diagnosis not present

## 2022-07-17 DIAGNOSIS — G8929 Other chronic pain: Secondary | ICD-10-CM | POA: Diagnosis not present

## 2022-07-17 DIAGNOSIS — D631 Anemia in chronic kidney disease: Secondary | ICD-10-CM | POA: Diagnosis not present

## 2022-07-17 DIAGNOSIS — N2581 Secondary hyperparathyroidism of renal origin: Secondary | ICD-10-CM | POA: Diagnosis not present

## 2022-07-17 DIAGNOSIS — N186 End stage renal disease: Secondary | ICD-10-CM | POA: Diagnosis not present

## 2022-07-17 DIAGNOSIS — R519 Headache, unspecified: Secondary | ICD-10-CM | POA: Diagnosis not present

## 2022-07-20 DIAGNOSIS — N2581 Secondary hyperparathyroidism of renal origin: Secondary | ICD-10-CM | POA: Diagnosis not present

## 2022-07-20 DIAGNOSIS — D631 Anemia in chronic kidney disease: Secondary | ICD-10-CM | POA: Diagnosis not present

## 2022-07-20 DIAGNOSIS — G8929 Other chronic pain: Secondary | ICD-10-CM | POA: Diagnosis not present

## 2022-07-20 DIAGNOSIS — R519 Headache, unspecified: Secondary | ICD-10-CM | POA: Diagnosis not present

## 2022-07-20 DIAGNOSIS — Z992 Dependence on renal dialysis: Secondary | ICD-10-CM | POA: Diagnosis not present

## 2022-07-20 DIAGNOSIS — N186 End stage renal disease: Secondary | ICD-10-CM | POA: Diagnosis not present

## 2022-07-22 ENCOUNTER — Telehealth: Payer: Medicare Other

## 2022-07-22 DIAGNOSIS — R519 Headache, unspecified: Secondary | ICD-10-CM | POA: Diagnosis not present

## 2022-07-22 DIAGNOSIS — G8929 Other chronic pain: Secondary | ICD-10-CM | POA: Diagnosis not present

## 2022-07-22 DIAGNOSIS — Z992 Dependence on renal dialysis: Secondary | ICD-10-CM | POA: Diagnosis not present

## 2022-07-22 DIAGNOSIS — N186 End stage renal disease: Secondary | ICD-10-CM | POA: Diagnosis not present

## 2022-07-22 DIAGNOSIS — N2581 Secondary hyperparathyroidism of renal origin: Secondary | ICD-10-CM | POA: Diagnosis not present

## 2022-07-22 DIAGNOSIS — D631 Anemia in chronic kidney disease: Secondary | ICD-10-CM | POA: Diagnosis not present

## 2022-07-24 DIAGNOSIS — Z992 Dependence on renal dialysis: Secondary | ICD-10-CM | POA: Diagnosis not present

## 2022-07-24 DIAGNOSIS — D631 Anemia in chronic kidney disease: Secondary | ICD-10-CM | POA: Diagnosis not present

## 2022-07-24 DIAGNOSIS — N2581 Secondary hyperparathyroidism of renal origin: Secondary | ICD-10-CM | POA: Diagnosis not present

## 2022-07-24 DIAGNOSIS — G8929 Other chronic pain: Secondary | ICD-10-CM | POA: Diagnosis not present

## 2022-07-24 DIAGNOSIS — N186 End stage renal disease: Secondary | ICD-10-CM | POA: Diagnosis not present

## 2022-07-24 DIAGNOSIS — R519 Headache, unspecified: Secondary | ICD-10-CM | POA: Diagnosis not present

## 2022-07-27 DIAGNOSIS — Z992 Dependence on renal dialysis: Secondary | ICD-10-CM | POA: Diagnosis not present

## 2022-07-27 DIAGNOSIS — D631 Anemia in chronic kidney disease: Secondary | ICD-10-CM | POA: Diagnosis not present

## 2022-07-27 DIAGNOSIS — G8929 Other chronic pain: Secondary | ICD-10-CM | POA: Diagnosis not present

## 2022-07-27 DIAGNOSIS — N2581 Secondary hyperparathyroidism of renal origin: Secondary | ICD-10-CM | POA: Diagnosis not present

## 2022-07-27 DIAGNOSIS — N186 End stage renal disease: Secondary | ICD-10-CM | POA: Diagnosis not present

## 2022-07-27 DIAGNOSIS — R519 Headache, unspecified: Secondary | ICD-10-CM | POA: Diagnosis not present

## 2022-07-29 DIAGNOSIS — R519 Headache, unspecified: Secondary | ICD-10-CM | POA: Diagnosis not present

## 2022-07-29 DIAGNOSIS — N186 End stage renal disease: Secondary | ICD-10-CM | POA: Diagnosis not present

## 2022-07-29 DIAGNOSIS — G8929 Other chronic pain: Secondary | ICD-10-CM | POA: Diagnosis not present

## 2022-07-29 DIAGNOSIS — D631 Anemia in chronic kidney disease: Secondary | ICD-10-CM | POA: Diagnosis not present

## 2022-07-29 DIAGNOSIS — N2581 Secondary hyperparathyroidism of renal origin: Secondary | ICD-10-CM | POA: Diagnosis not present

## 2022-07-29 DIAGNOSIS — Z992 Dependence on renal dialysis: Secondary | ICD-10-CM | POA: Diagnosis not present

## 2022-07-31 DIAGNOSIS — N2581 Secondary hyperparathyroidism of renal origin: Secondary | ICD-10-CM | POA: Diagnosis not present

## 2022-07-31 DIAGNOSIS — R519 Headache, unspecified: Secondary | ICD-10-CM | POA: Diagnosis not present

## 2022-07-31 DIAGNOSIS — N186 End stage renal disease: Secondary | ICD-10-CM | POA: Diagnosis not present

## 2022-07-31 DIAGNOSIS — Z992 Dependence on renal dialysis: Secondary | ICD-10-CM | POA: Diagnosis not present

## 2022-07-31 DIAGNOSIS — G8929 Other chronic pain: Secondary | ICD-10-CM | POA: Diagnosis not present

## 2022-07-31 DIAGNOSIS — D631 Anemia in chronic kidney disease: Secondary | ICD-10-CM | POA: Diagnosis not present

## 2022-08-02 ENCOUNTER — Ambulatory Visit (INDEPENDENT_AMBULATORY_CARE_PROVIDER_SITE_OTHER): Payer: Medicare Other

## 2022-08-02 DIAGNOSIS — Z0000 Encounter for general adult medical examination without abnormal findings: Secondary | ICD-10-CM | POA: Diagnosis not present

## 2022-08-02 NOTE — Progress Notes (Cosign Needed Addendum)
I connected with [name] today by telephone and verified that I am speaking with the correct person using two identifiers. Location patient: home Location provider: work Persons participating in the virtual visit: patient, provider.   I discussed the limitations, risks, security and privacy concerns of performing an evaluation and management service by telephone and the availability of in person appointments. I also discussed with the patient that there may be a patient responsible charge related to this service. The patient expressed understanding and verbally consented to this telephonic visit.    Interactive audio and video telecommunications were attempted between this provider and patient, however failed, due to patient having technical difficulties OR patient did not have access to video capability.  We continued and completed visit with audio only.  Some vital signs may be absent or patient reported.   Time Spent with patient on telephone encounter: 30 minutes  Subjective:   [name] is a 71 y.o. female who presents for Medicare Annual (Subsequent) preventive examination.  Review of Systems     Cardiac Risk Factors include: advanced age (>54men, >12 women);diabetes mellitus;dyslipidemia;family history of premature cardiovascular disease;hypertension;sedentary lifestyle     Objective:    There were no vitals filed for this visit. There is no height or weight on file to calculate BMI.     08/02/2022    4:08 PM 05/28/2022    9:29 AM 03/11/2022    1:46 PM 03/09/2022   11:20 PM 08/06/2021   11:00 AM 08/05/2021    8:26 PM 12/10/2020    3:18 PM  Advanced Directives  Does Patient Have a Medical Advance Directive? Yes Yes No;Yes No Yes Yes Yes  Type of Advance Directive Living will;Healthcare Power of [place];Living will [place];Living will  [place];Living will  [place];Living will   Does patient want to make changes to medical advance directive? No - Patient declined    No - Patient declined  No - Patient declined  Copy of [place] in Chart? No - copy requested  No - copy requested  No - copy requested  No - copy requested    Current Medications (verified) Outpatient Encounter Medications as of 08/02/2022  Medication Sig   Accu-Chek Softclix Lancets lancets Use 1 lancet up to 4 times daily as directed   acetaminophen (TYLENOL) 500 MG tablet Take 1,000 mg by mouth 2 (two) times daily as needed for headache.   acidophilus (RISAQUAD) CAPS capsule Take 1 capsule by mouth daily.   aspirin-acetaminophen-caffeine (EXCEDRIN MIGRAINE) 250-250-65 MG tablet Take 1 tablet by mouth daily as needed for headache or migraine. Max 3 doses in one week   B Complex-C-Folic Acid (RENA-VITE PO) Take 1 tablet by mouth every morning.   blood glucose meter kit and supplies Dispense based on patient and insurance preference. Used to check blood sugar daily, DX: E11.9 OneTouch   Blood Glucose Monitoring Suppl (ACCU-CHEK GUIDE ME) w/Device KIT Use as directed to check blood sugar four times daily.   buPROPion (WELLBUTRIN XL) 300 MG 24 hr tablet Take 1 tablet (300 mg total) by mouth in the morning.   carboxymethylcellulose (REFRESH PLUS) 0.5 % SOLN Place 1 drop into both eyes 3 (three) times daily as needed (dry eyes/irritation).   ciclopirox (PENLAC) 8 % solution Apply topically at bedtime. Apply over nail and surrounding skin daily   cyclobenzaprine (FLEXERIL) 10 MG tablet Take 1 tablet (10 mg total) by mouth 3 (three)  times daily as needed for muscle spasms.   ezetimibe (ZETIA) 10 MG tablet Take 1 tablet (10 mg total) by mouth daily.   ferric citrate (AURYXIA) 1 GM 210 MG(Fe) tablet Take 210 mg by mouth 3 (three) times daily with meals.   FLUoxetine (PROZAC) 20 MG capsule Take 1 capsule (20 mg total) by mouth in the morning.   glimepiride (AMARYL) 1 MG tablet Take 1 tablet (1  mg total) by mouth daily with breakfast.   glucose blood (ACCU-CHEK GUIDE) test strip Use to check blood sugar up to four times daily.   glucose blood (TRUETEST TEST) test strip Used to check blood sugar daily, DX: E11.9   ketoconazole (NIZORAL) 2 % cream Apply 1 application  topically daily.   lidocaine (LIDODERM) 5 % Place 2 patches onto the skin daily.   lidocaine (XYLOCAINE) 5 % ointment Apply 1 application topically 3 (three) times daily as needed for moderate pain.   lidocaine-prilocaine (EMLA) cream Apply 1 application topically See admin instructions. Apply topically to port access one hour prior to dialysis, cover with plastic- Tuesday, Thursday, Saturday   LORazepam (ATIVAN) 2 MG tablet Take 1 tablet (2 mg total) by mouth 2 (two) times daily as needed for anxiety   midodrine (PROAMATINE) 5 MG tablet Take 1-2 tablet by mouth three times a week as directed Take 30 minutes before dialysis and another tablet during middle of dialysis treatment (Patient taking differently: Take 5 mg by mouth See admin instructions. Take 5 mg by mouth at 5:30 am  and 8:30 am on Tuesday, Thursday and Saturday.)   multivitamin-lutein (OCUVITE-LUTEIN) CAPS capsule Take 1 capsule by mouth every morning.   naloxone (NARCAN) nasal spray 4 mg/0.1 mL Take by nasal route every 3 minutes until patient awakes or EMS arrives.   ondansetron (ZOFRAN) 4 MG tablet Take 1 tablet (4 mg total) by mouth every 8 (eight) hours as needed for nausea or vomiting.   oxyCODONE (ROXICODONE) 15 MG immediate release tablet Take 1 tablet (15 mg total) by mouth 4 (four) times daily as needed.   pantoprazole (PROTONIX) 40 MG tablet TAKE 1 TABLET BY MOUTH  DAILY   pilocarpine (SALAGEN) 5 MG tablet TAKE 1 TABLET BY MOUTH  TWICE DAILY (Patient taking differently: Take 5 mg by mouth daily.)   pioglitazone (ACTOS) 15 MG tablet TAKE 1 TABLET BY MOUTH  DAILY   polyethylene glycol (MIRALAX / GLYCOLAX) 17 g packet Take 17 g by mouth every 7 (seven) days.    PREVIDENT 5000 DRY MOUTH 1.1 % GEL dental gel Place 1 application  onto teeth in the morning and at bedtime.   No facility-administered encounter medications on file as of 08/02/2022.    Allergies (verified) Erythromycin and Gabapentin   History: Past Medical History:  Diagnosis Date   Anemia    Anxiety    Brain tumor (benign) ([place])    Colitis 2010   microscopic- Dr Henrene Pastor   Depression    Diabetes mellitus    type II   Dyspnea    with exertion   ESRD (end stage renal disease) ([place])    TTUSAT [address]    Fibromyalgia    GERD (gastroesophageal reflux disease)    Headache    History of blood transfusion    after knee surgery   Hypertension    discontinued all diuretics and antihypertensives   IBS (irritable bowel syndrome)    LBP (low back pain)    Neuropathy    feet bilat  Osteoarthritis    Osteopenia    Pneumonia    hx of 2014    Rotator cuff tear, right    Sinusitis    currently being treated with antibiotic will complete 03/04/2015   Past Surgical History:  Procedure Laterality Date   AV FISTULA PLACEMENT Right 09/12/2020   Procedure: RIGHT ARTERIOVENOUS (AV) FISTULA CREATION;  Surgeon: Elam Dutch, MD;  Location: [facility];  Service: Vascular;  Laterality: Right;   [place] Right 10/27/2020   Procedure: RIGHT UPPER EXTREMITY SECOND STAGE [place];  Surgeon: Elam Dutch, MD;  Location: [facility];  Service: Vascular;  Laterality: Right;   BIOPSY  03/11/2022   Procedure: BIOPSY;  Surgeon: Irving Copas., MD;  Location: [facility] ENDOSCOPY;  Service: Gastroenterology;;   COLONOSCOPY WITH PROPOFOL N/A 03/11/2022   Procedure: COLONOSCOPY WITH PROPOFOL;  Surgeon: Irving Copas., MD;  Location: [facility];  Service: Gastroenterology;  Laterality: N/A;   foramen magnum ependymoma surgery  2003   Dr Rita Ohara   JOINT REPLACEMENT Bilateral    NASAL SINUS SURGERY     1973    REVERSE SHOULDER ARTHROPLASTY Right  06/03/2022   Procedure: REVERSE SHOULDER ARTHROPLASTY;  Surgeon: Justice Britain, MD;  Location: [facility];  Service: Orthopedics;  Laterality: Right;  156min   TONSILLECTOMY     TOTAL KNEE ARTHROPLASTY     L 2008, R 2009, R 2016- Dr Maureen Ralphs   TOTAL KNEE REVISION Right 03/05/2015   Procedure: RIGHT TOTAL KNEE ARTHROPLASTY REVISION;  Surgeon: Gaynelle Arabian, MD;  Location: [facility];  Service: Orthopedics;  Laterality: Right;   Family History  Problem Relation Age of Onset   Depression Mother    Hypertension Mother    Stroke Mother 41   Diabetes Mother    Diabetes Father    Hyperlipidemia Father    Hypertension Father    Crohn's disease Maternal Uncle    Cancer Brother        Prostate   Diabetes Brother    Heart disease Brother    Hyperlipidemia Brother    Hypertension Brother    Diabetes Brother    Heart disease Brother        Heart Disease before age 59   Heart attack Brother    Stroke Brother        X's 2   Hyperlipidemia Brother    Hypertension Brother    Diabetes Other    Colon cancer Neg Hx    Esophageal cancer Neg Hx    Stomach cancer Neg Hx    Pancreatic cancer Neg Hx    Breast cancer Neg Hx    Social History   Socioeconomic History   Marital status: Single    Spouse name: Not on file   Number of children: Not on file   Years of education: Not on file   Highest education level: Not on file  Occupational History   Not on file  Tobacco Use   Smoking status: Former    Packs/day: 1.00    Years: 20.00    Total pack years: 20.00    Types: Cigarettes    Quit date: 12/13/1988    Years since quitting: 33.6   Smokeless tobacco: Never  Vaping Use   Vaping Use: Never used  Substance and Sexual Activity   Alcohol use: Not Currently    Alcohol/week: 0.0 standard drinks of alcohol    Comment: rarely   Drug use: No   Sexual activity: Not Currently  Other Topics Concern  Not on file  Social History Narrative   Not on file   Social Determinants of Health   Financial  Resource Strain: Low Risk  (08/02/2022)   Overall Financial Resource Strain (CARDIA)    Difficulty of Paying Living Expenses: Not hard at all  Food Insecurity: No Food Insecurity (08/02/2022)   Hunger Vital Sign    Worried About Running Out of Food in the Last Year: Never true    Ran Out of Food in the Last Year: Never true  Transportation Needs: No Transportation Needs (08/02/2022)   PRAPARE - Hydrologist (Medical): No    Lack of Transportation (Non-Medical): No  Physical Activity: Inactive (08/02/2022)   Exercise Vital Sign    Days of Exercise per Week: 0 days    Minutes of Exercise per Session: 0 min  Stress: No Stress Concern Present (08/02/2022)   [place]    Feeling of Stress : Not at all  Social Connections: Unknown (08/02/2022)   Social Connection and Isolation Panel [NHANES]    Frequency of Communication with Friends and Family: More than three times a week    Frequency of Social Gatherings with Friends and Family: More than three times a week    Attends Religious Services: Patient refused    Marine scientist or Organizations: Patient refused    Attends Music therapist: Patient refused    Marital Status: Never married    Tobacco Counseling Counseling given: Not Answered   Clinical Intake:  Pre-visit preparation completed: No  Pain : No/denies pain     Nutritional Risks: None Diabetes: No  How often do you need to have someone help you when you read instructions, pamphlets, or other written materials from your doctor or pharmacy?: 1 - Never What is the last grade level you completed in school?: RN  Diabetic? yes  Interpreter Needed?: No  Information entered by :: Zamar Odwyer, LPN.   Activities of Daily Living    08/02/2022    4:21 PM 05/28/2022    9:31 AM  In your present state of health, do you have any difficulty performing the following  activities:  Hearing? 0   Vision? 0   Difficulty concentrating or making decisions? 0   Walking or climbing stairs? 0   Dressing or bathing? 0   Doing errands, shopping? 0 0  Preparing Food and eating ? N   Using the Toilet? N   In the past six months, have you accidently leaked urine? N   Do you have problems with loss of bowel control? N   Managing your Medications? N   Managing your Finances? N   Housekeeping or managing your Housekeeping? N     Patient Care Team: Plotnikov, Evie Lacks, MD as PCP - General Pedro Earls, MD as Attending Physician (Sports Medicine) Corliss Parish, MD as Consulting Physician (Nephrology) [facility] [name], MD as Attending Physician (Nephrology) [name], MD as Consulting Physician (Orthopedic Surgery) Justice Britain, MD as Consulting Physician (Orthopedic Surgery) Szabat, Darnelle Maffucci, [facility] (Inactive) as Pharmacist (Pharmacist) Noemi Chapel, NP as Nurse Practitioner Croitoru, Dani Gobble, MD as Consulting Physician (Cardiology) Greta Doom, OD as Consulting Physician (Optometry)  Indicate any recent Medical Services you may have received from other than [facility] providers in the past year (date may be approximate).     Assessment:   This is a routine wellness examination for [name].  Hearing/Vision screen  Hearing Screening - Comments:: Patient has hearing difficulty and no hearing aids. Vision Screening - Comments:: Patient does wear corrective lenses/contacts.  Eye exam done by: Carmel [place], OD.   Dietary issues and exercise activities discussed: Current Exercise Habits: The patient does not participate in regular exercise at present, Exercise limited by: Other - see comments (renal dialysis)   Goals Addressed             This Visit's Progress    NO GOALS AT THIS TIME.       Patient declined health goal at this time.        Depression Screen    08/02/2022    4:17 PM 06/25/2022    8:25 AM 06/02/2022     2:54 PM 12/21/2021    3:13 PM 08/14/2021    2:18 PM 12/10/2020    4:42 PM 07/28/2020    3:55 PM  PHQ 2/9 Scores  PHQ - 2 Score $Remov'5 5 1 'xaBNWk$ 0 4 0 0  PHQ- 9 Score $Remov'22 22 8  12  'DynwUQ$ 0    Fall Risk    08/02/2022    4:10 PM 06/25/2022    8:25 AM 06/02/2022    2:53 PM 12/21/2021    3:12 PM 12/10/2020    4:42 PM  Fall Risk   Falls in the past year? 0 0 1 1 0  Number falls in past yr: 0 0 0 1 0  Injury with Fall? 0 0 1 0 0  Risk for fall due to : No Fall Risks No Fall Risks  History of fall(s);Impaired balance/gait No Fall Risks  Follow up Falls evaluation completed Falls evaluation completed   Falls evaluation completed    [place]:  Any stairs in or around the home? Yes  If so, are there any without handrails? No  Home free of loose throw rugs in walkways, pet beds, electrical cords, etc? No  Adequate lighting in your home to reduce risk of falls? No   ASSISTIVE DEVICES UTILIZED TO PREVENT FALLS:  Life alert? No  Use of a cane, walker or w/c? No  Grab bars in the bathroom? Yes  Shower chair or bench in shower? Yes  Elevated toilet seat or a handicapped toilet? Yes   TIMED UP AND GO:  Was the test performed? No .  Length of time to ambulate 10 feet: n/a sec.   Appearance of gait: Gait not evaluated during this visit.  Cognitive Function:        08/02/2022    4:27 PM  6CIT Screen  What Year? 0 points  What month? 0 points  What time? 0 points  Count back from 20 0 points  Months in reverse 0 points  Repeat phrase 0 points  Total Score 0 points    Immunizations Immunization History  Administered Date(s) Administered   Fluad Quad(high Dose 65+) 08/29/2019, 10/27/2020   Hep A / Hep B 01/16/2019, 04/16/2019, 07/17/2019   Influenza Whole 08/20/2010, 08/14/2011   Influenza, High Dose Seasonal PF 09/19/2017, 10/12/2018, 08/29/2019, 10/23/2020, 09/24/2021   Influenza,inj,Quad PF,6+ Mos 09/19/2015, 08/20/2016   Influenza-Unspecified 09/12/2013,  09/16/2014, 09/19/2015, 08/20/2016, 10/27/2020, 10/22/2021   PFIZER Comirnaty(Gray Top)Covid-19 Tri-Sucrose Vaccine 03/22/2021   PFIZER(Purple Top)SARS-COV-2 Vaccination 01/18/2020, 02/08/2020, 09/18/2020, 10/27/2020   Pneumococcal Conjugate-13 03/20/2014   Pneumococcal Polysaccharide-23 02/04/2010, 11/29/2020   Td 07/15/2004   Tdap 08/20/2016   Unspecified SARS-COV-2 Vaccination 10/23/2021   Zoster Recombinat (Shingrix) 10/23/2019, 04/29/2020   Zoster, Live 12/19/2013   Zoster,  Unspecified 10/23/2019, 04/29/2020    TDAP status: Up to date  Flu Vaccine status: Up to date  Pneumococcal vaccine status: Up to date  Covid-19 vaccine status: Completed vaccines  Qualifies for Shingles Vaccine? Yes   Zostavax completed Yes   Shingrix Completed?: Yes  Screening Tests Health Maintenance  Topic Date Due   Hepatitis C Screening  Never done   FOOT EXAM  04/28/2017   COVID-19 Vaccine (7 - Pfizer series) 02/20/2022   INFLUENZA VACCINE  07/13/2022   HEMOGLOBIN A1C  09/10/2022   OPHTHALMOLOGY EXAM  04/01/2023   MAMMOGRAM  05/14/2024   TETANUS/TDAP  08/20/2026   COLONOSCOPY (Pts 45-9yrs Insurance coverage will need to be confirmed)  03/11/2029   Pneumonia Vaccine 33+ Years old  Completed   DEXA SCAN  Completed   Zoster Vaccines- Shingrix  Completed   HPV VACCINES  Aged Out    Health Maintenance  Health Maintenance Due  Topic Date Due   Hepatitis C Screening  Never done   FOOT EXAM  04/28/2017   COVID-19 Vaccine (7 - Pfizer series) 02/20/2022   INFLUENZA VACCINE  07/13/2022    Colorectal cancer screening: Type of screening: Colonoscopy. Completed 02/04/2015. Repeat every 10 years  Mammogram status: Completed 05/31/2022. Repeat every year  Bone Density status: Completed 04/05/2017. Results reflect: Bone density results: NORMAL. Repeat every 5 years.  Lung Cancer Screening: (Low Dose CT Chest recommended if Age 15-80 years, 30 pack-year currently smoking OR have quit w/in  15years.) does not qualify.   Lung Cancer Screening Referral: no  Additional Screening:  Hepatitis C Screening: does qualify; Completed no  Vision Screening: Recommended annual ophthalmology exams for early detection of glaucoma and other disorders of the eye. Is the patient up to date with their annual eye exam?  Yes  Who is the provider or what is the name of the office in which the patient attends annual eye exams? Carmel Sacramento, OD. If pt is not established with a provider, would they like to be referred to a provider to establish care? No .   Dental Screening: Recommended annual dental exams for proper oral hygiene  Community Resource Referral / Chronic Care Management: CRR required this visit?  No   CCM required this visit?  No      Plan:     I have personally reviewed and noted the following in the patient's chart:   Medical and social history Use of alcohol, tobacco or illicit drugs  Current medications and supplements including opioid prescriptions. Patient is currently taking opioid prescriptions. Information provided to patient regarding non-opioid alternatives. Patient advised to discuss non-opioid treatment plan with their provider. Functional ability and status Nutritional status Physical activity Advanced directives List of other physicians Hospitalizations, surgeries, and ER visits in previous 12 months Vitals Screenings to include cognitive, depression, and falls Referrals and appointments  In addition, I have reviewed and discussed with patient certain preventive protocols, quality metrics, and best practice recommendations. A written personalized care plan for preventive services as well as general preventive health recommendations were provided to patient.     Sheral Flow, LPN   [id number]   Nurse Notes:  There were no vitals filed for this visit. There is no height or weight on file to calculate BMI.  Medical screening  examination/treatment/procedure(s) were performed by non-physician practitioner and as supervising physician I was immediately available for consultation/collaboration.  I agree with above. Lew Dawes, MD

## 2022-08-02 NOTE — Patient Instructions (Signed)
[name] , Thank you for taking time to come for your Medicare Wellness Visit. I appreciate your ongoing commitment to your health goals. Please review the following plan we discussed and let me know if I can assist you in the future.   Screening recommendations/referrals: Colonoscopy: 02/04/2015; due every 10 years Mammogram: 05/31/2022; due every year Bone Density: 04/05/2017;due every 5 years Recommended yearly ophthalmology/optometry visit for glaucoma screening and checkup Recommended yearly dental visit for hygiene and checkup  Vaccinations: Influenza vaccine: 10/22/2021 Pneumococcal vaccine: 03/20/2014, 11/29/2020 Tdap vaccine: 08/20/2016; due every 10 years Shingles vaccine: 10/23/2019, 04/29/2020   Covid-19: 01/18/2020, 02/08/2020, 09/18/2020, 03/22/2021, 10/23/2021  Advanced directives: Yes; documents on file  Conditions/risks identified: Yes  Next appointment: Please schedule your next Medicare Wellness Visit with your Nurse Health Advisor in 1 year by calling [phone].   Preventive Care 70 Years and Older, Female Preventive care refers to lifestyle choices and visits with your health care provider that can promote health and wellness. What does preventive care include? A yearly physical exam. This is also called an annual well check. Dental exams once or twice a year. Routine eye exams. Ask your health care provider how often you should have your eyes checked. Personal lifestyle choices, including: Daily care of your teeth and gums. Regular physical activity. Eating a healthy diet. Avoiding tobacco and drug use. Limiting alcohol use. Practicing safe sex. Taking low-dose aspirin every day. Taking vitamin and mineral supplements as recommended by your health care provider. What happens during an annual well check? The services and screenings done by your health care provider during your annual well check will depend on your age, overall health, lifestyle risk factors, and  family history of disease. Counseling  Your health care provider may ask you questions about your: Alcohol use. Tobacco use. Drug use. Emotional well-being. Home and relationship well-being. Sexual activity. Eating habits. History of falls. Memory and ability to understand (cognition). Work and work Statistician. Reproductive health. Screening  You may have the following tests or measurements: Height, weight, and BMI. Blood pressure. Lipid and cholesterol levels. These may be checked every 5 years, or more frequently if you are over 70 years old. Skin check. Lung cancer screening. You may have this screening every year starting at age 70 if you have a 30-pack-year history of smoking and currently smoke or have quit within the past 15 years. Fecal occult blood test (FOBT) of the stool. You may have this test every year starting at age 70. Flexible sigmoidoscopy or colonoscopy. You may have a sigmoidoscopy every 5 years or a colonoscopy every 10 years starting at age 53. Hepatitis C blood test. Hepatitis B blood test. Sexually transmitted disease (STD) testing. Diabetes screening. This is done by checking your blood sugar (glucose) after you have not eaten for a while (fasting). You may have this done every 1-3 years. Bone density scan. This is done to screen for osteoporosis. You may have this done starting at age 70. Mammogram. This may be done every 1-2 years. Talk to your health care provider about how often you should have regular mammograms. Talk with your health care provider about your test results, treatment options, and if necessary, the need for more tests. Vaccines  Your health care provider may recommend certain vaccines, such as: Influenza vaccine. This is recommended every year. Tetanus, diphtheria, and acellular pertussis (Tdap, Td) vaccine. You may need a Td booster every 10 years. Zoster vaccine. You may need this after age 70. Pneumococcal 13-valent conjugate  (  PCV13) vaccine. One dose is recommended after age 70. Pneumococcal polysaccharide (PPSV23) vaccine. One dose is recommended after age 70. Talk to your health care provider about which screenings and vaccines you need and how often you need them. This information is not intended to replace advice given to you by your health care provider. Make sure you discuss any questions you have with your health care provider. Document Released: 12/26/2015 Document Revised: 08/18/2016 Document Reviewed: 09/30/2015 Elsevier Interactive Patient Education  2017 [place] Prevention in the Home Falls can cause injuries. They can happen to people of all ages. There are many things you can do to make your home safe and to help prevent falls. What can I do on the outside of my home? Regularly fix the edges of walkways and driveways and fix any cracks. Remove anything that might make you trip as you walk through a door, such as a raised step or threshold. Trim any bushes or trees on the path to your home. Use bright outdoor lighting. Clear any walking paths of anything that might make someone trip, such as rocks or tools. Regularly check to see if handrails are loose or broken. Make sure that both sides of any steps have handrails. Any raised decks and porches should have guardrails on the edges. Have any leaves, snow, or ice cleared regularly. Use sand or salt on walking paths during winter. Clean up any spills in your garage right away. This includes oil or grease spills. What can I do in the bathroom? Use night lights. Install grab bars by the toilet and in the tub and shower. Do not use towel bars as grab bars. Use non-skid mats or decals in the tub or shower. If you need to sit down in the shower, use a plastic, non-slip stool. Keep the floor dry. Clean up any water that spills on the floor as soon as it happens. Remove soap buildup in the tub or shower regularly. Attach bath mats securely with  double-sided non-slip rug tape. Do not have throw rugs and other things on the floor that can make you trip. What can I do in the bedroom? Use night lights. Make sure that you have a light by your bed that is easy to reach. Do not use any sheets or blankets that are too big for your bed. They should not hang down onto the floor. Have a firm chair that has side arms. You can use this for support while you get dressed. Do not have throw rugs and other things on the floor that can make you trip. What can I do in the kitchen? Clean up any spills right away. Avoid walking on wet floors. Keep items that you use a lot in easy-to-reach places. If you need to reach something above you, use a strong step stool that has a grab bar. Keep electrical cords out of the way. Do not use floor polish or wax that makes floors slippery. If you must use wax, use non-skid floor wax. Do not have throw rugs and other things on the floor that can make you trip. What can I do with my stairs? Do not leave any items on the stairs. Make sure that there are handrails on both sides of the stairs and use them. Fix handrails that are broken or loose. Make sure that handrails are as long as the stairways. Check any carpeting to make sure that it is firmly attached to the stairs. Fix any carpet that is loose  or worn. Avoid having throw rugs at the top or bottom of the stairs. If you do have throw rugs, attach them to the floor with carpet tape. Make sure that you have a light switch at the top of the stairs and the bottom of the stairs. If you do not have them, ask someone to add them for you. What else can I do to help prevent falls? Wear shoes that: Do not have high heels. Have rubber bottoms. Are comfortable and fit you well. Are closed at the toe. Do not wear sandals. If you use a stepladder: Make sure that it is fully opened. Do not climb a closed stepladder. Make sure that both sides of the stepladder are locked  into place. Ask someone to hold it for you, if possible. Clearly mark and make sure that you can see: Any grab bars or handrails. First and last steps. Where the edge of each step is. Use tools that help you move around (mobility aids) if they are needed. These include: Canes. Walkers. Scooters. Crutches. Turn on the lights when you go into a dark area. Replace any light bulbs as soon as they burn out. Set up your furniture so you have a clear path. Avoid moving your furniture around. If any of your floors are uneven, fix them. If there are any pets around you, be aware of where they are. Review your medicines with your doctor. Some medicines can make you feel dizzy. This can increase your chance of falling. Ask your doctor what other things that you can do to help prevent falls. This information is not intended to replace advice given to you by your health care provider. Make sure you discuss any questions you have with your health care provider. Document Released: 09/25/2009 Document Revised: 05/06/2016 Document Reviewed: 01/03/2015 Elsevier Interactive Patient Education  2017 [employer].

## 2022-08-03 DIAGNOSIS — Z992 Dependence on renal dialysis: Secondary | ICD-10-CM | POA: Diagnosis not present

## 2022-08-03 DIAGNOSIS — N2581 Secondary hyperparathyroidism of renal origin: Secondary | ICD-10-CM | POA: Diagnosis not present

## 2022-08-03 DIAGNOSIS — R519 Headache, unspecified: Secondary | ICD-10-CM | POA: Diagnosis not present

## 2022-08-03 DIAGNOSIS — N186 End stage renal disease: Secondary | ICD-10-CM | POA: Diagnosis not present

## 2022-08-03 DIAGNOSIS — D631 Anemia in chronic kidney disease: Secondary | ICD-10-CM | POA: Diagnosis not present

## 2022-08-03 DIAGNOSIS — G8929 Other chronic pain: Secondary | ICD-10-CM | POA: Diagnosis not present

## 2022-08-05 DIAGNOSIS — N186 End stage renal disease: Secondary | ICD-10-CM | POA: Diagnosis not present

## 2022-08-05 DIAGNOSIS — R519 Headache, unspecified: Secondary | ICD-10-CM | POA: Diagnosis not present

## 2022-08-05 DIAGNOSIS — N2581 Secondary hyperparathyroidism of renal origin: Secondary | ICD-10-CM | POA: Diagnosis not present

## 2022-08-05 DIAGNOSIS — D631 Anemia in chronic kidney disease: Secondary | ICD-10-CM | POA: Diagnosis not present

## 2022-08-05 DIAGNOSIS — Z992 Dependence on renal dialysis: Secondary | ICD-10-CM | POA: Diagnosis not present

## 2022-08-05 DIAGNOSIS — G8929 Other chronic pain: Secondary | ICD-10-CM | POA: Diagnosis not present

## 2022-08-06 ENCOUNTER — Other Ambulatory Visit (HOSPITAL_COMMUNITY): Payer: Self-pay

## 2022-08-07 DIAGNOSIS — R519 Headache, unspecified: Secondary | ICD-10-CM | POA: Diagnosis not present

## 2022-08-07 DIAGNOSIS — G8929 Other chronic pain: Secondary | ICD-10-CM | POA: Diagnosis not present

## 2022-08-07 DIAGNOSIS — D631 Anemia in chronic kidney disease: Secondary | ICD-10-CM | POA: Diagnosis not present

## 2022-08-07 DIAGNOSIS — N186 End stage renal disease: Secondary | ICD-10-CM | POA: Diagnosis not present

## 2022-08-07 DIAGNOSIS — Z992 Dependence on renal dialysis: Secondary | ICD-10-CM | POA: Diagnosis not present

## 2022-08-07 DIAGNOSIS — N2581 Secondary hyperparathyroidism of renal origin: Secondary | ICD-10-CM | POA: Diagnosis not present

## 2022-08-09 ENCOUNTER — Other Ambulatory Visit (HOSPITAL_COMMUNITY): Payer: Self-pay

## 2022-08-09 ENCOUNTER — Other Ambulatory Visit: Payer: Self-pay | Admitting: Internal Medicine

## 2022-08-09 MED ORDER — OXYCODONE HCL 15 MG PO TABS
15.0000 mg | ORAL_TABLET | Freq: Four times a day (QID) | ORAL | 0 refills | Status: DC | PRN
  Filled 2022-08-09: qty 120, 30d supply, fill #0

## 2022-08-10 ENCOUNTER — Other Ambulatory Visit (HOSPITAL_COMMUNITY): Payer: Self-pay

## 2022-08-10 DIAGNOSIS — N186 End stage renal disease: Secondary | ICD-10-CM | POA: Diagnosis not present

## 2022-08-10 DIAGNOSIS — G8929 Other chronic pain: Secondary | ICD-10-CM | POA: Diagnosis not present

## 2022-08-10 DIAGNOSIS — R519 Headache, unspecified: Secondary | ICD-10-CM | POA: Diagnosis not present

## 2022-08-10 DIAGNOSIS — D631 Anemia in chronic kidney disease: Secondary | ICD-10-CM | POA: Diagnosis not present

## 2022-08-10 DIAGNOSIS — Z992 Dependence on renal dialysis: Secondary | ICD-10-CM | POA: Diagnosis not present

## 2022-08-10 DIAGNOSIS — N2581 Secondary hyperparathyroidism of renal origin: Secondary | ICD-10-CM | POA: Diagnosis not present

## 2022-08-11 ENCOUNTER — Other Ambulatory Visit (HOSPITAL_COMMUNITY): Payer: Self-pay

## 2022-08-11 MED ORDER — LORAZEPAM 2 MG PO TABS
2.0000 mg | ORAL_TABLET | Freq: Two times a day (BID) | ORAL | 0 refills | Status: DC | PRN
  Filled 2022-08-11: qty 60, 30d supply, fill #0

## 2022-08-12 ENCOUNTER — Other Ambulatory Visit: Payer: Self-pay | Admitting: Cardiovascular Disease

## 2022-08-12 ENCOUNTER — Other Ambulatory Visit: Payer: Self-pay | Admitting: Internal Medicine

## 2022-08-12 ENCOUNTER — Other Ambulatory Visit (HOSPITAL_COMMUNITY): Payer: Self-pay

## 2022-08-12 DIAGNOSIS — D631 Anemia in chronic kidney disease: Secondary | ICD-10-CM | POA: Diagnosis not present

## 2022-08-12 DIAGNOSIS — N2581 Secondary hyperparathyroidism of renal origin: Secondary | ICD-10-CM | POA: Diagnosis not present

## 2022-08-12 DIAGNOSIS — E1022 Type 1 diabetes mellitus with diabetic chronic kidney disease: Secondary | ICD-10-CM | POA: Diagnosis not present

## 2022-08-12 DIAGNOSIS — R519 Headache, unspecified: Secondary | ICD-10-CM | POA: Diagnosis not present

## 2022-08-12 DIAGNOSIS — N186 End stage renal disease: Secondary | ICD-10-CM | POA: Diagnosis not present

## 2022-08-12 DIAGNOSIS — G8929 Other chronic pain: Secondary | ICD-10-CM | POA: Diagnosis not present

## 2022-08-12 DIAGNOSIS — Z992 Dependence on renal dialysis: Secondary | ICD-10-CM | POA: Diagnosis not present

## 2022-08-14 DIAGNOSIS — Z992 Dependence on renal dialysis: Secondary | ICD-10-CM | POA: Diagnosis not present

## 2022-08-14 DIAGNOSIS — D631 Anemia in chronic kidney disease: Secondary | ICD-10-CM | POA: Diagnosis not present

## 2022-08-14 DIAGNOSIS — N186 End stage renal disease: Secondary | ICD-10-CM | POA: Diagnosis not present

## 2022-08-14 DIAGNOSIS — R519 Headache, unspecified: Secondary | ICD-10-CM | POA: Diagnosis not present

## 2022-08-14 DIAGNOSIS — R52 Pain, unspecified: Secondary | ICD-10-CM | POA: Diagnosis not present

## 2022-08-14 DIAGNOSIS — N2581 Secondary hyperparathyroidism of renal origin: Secondary | ICD-10-CM | POA: Diagnosis not present

## 2022-08-14 DIAGNOSIS — G8929 Other chronic pain: Secondary | ICD-10-CM | POA: Diagnosis not present

## 2022-08-17 DIAGNOSIS — G8929 Other chronic pain: Secondary | ICD-10-CM | POA: Diagnosis not present

## 2022-08-17 DIAGNOSIS — D631 Anemia in chronic kidney disease: Secondary | ICD-10-CM | POA: Diagnosis not present

## 2022-08-17 DIAGNOSIS — N2581 Secondary hyperparathyroidism of renal origin: Secondary | ICD-10-CM | POA: Diagnosis not present

## 2022-08-17 DIAGNOSIS — N186 End stage renal disease: Secondary | ICD-10-CM | POA: Diagnosis not present

## 2022-08-17 DIAGNOSIS — R519 Headache, unspecified: Secondary | ICD-10-CM | POA: Diagnosis not present

## 2022-08-17 DIAGNOSIS — Z992 Dependence on renal dialysis: Secondary | ICD-10-CM | POA: Diagnosis not present

## 2022-08-17 DIAGNOSIS — R52 Pain, unspecified: Secondary | ICD-10-CM | POA: Diagnosis not present

## 2022-08-19 DIAGNOSIS — G8929 Other chronic pain: Secondary | ICD-10-CM | POA: Diagnosis not present

## 2022-08-19 DIAGNOSIS — N2581 Secondary hyperparathyroidism of renal origin: Secondary | ICD-10-CM | POA: Diagnosis not present

## 2022-08-19 DIAGNOSIS — R519 Headache, unspecified: Secondary | ICD-10-CM | POA: Diagnosis not present

## 2022-08-19 DIAGNOSIS — Z992 Dependence on renal dialysis: Secondary | ICD-10-CM | POA: Diagnosis not present

## 2022-08-19 DIAGNOSIS — D631 Anemia in chronic kidney disease: Secondary | ICD-10-CM | POA: Diagnosis not present

## 2022-08-19 DIAGNOSIS — N186 End stage renal disease: Secondary | ICD-10-CM | POA: Diagnosis not present

## 2022-08-19 DIAGNOSIS — R52 Pain, unspecified: Secondary | ICD-10-CM | POA: Diagnosis not present

## 2022-08-20 ENCOUNTER — Other Ambulatory Visit: Payer: Self-pay | Admitting: Internal Medicine

## 2022-08-21 DIAGNOSIS — R52 Pain, unspecified: Secondary | ICD-10-CM | POA: Diagnosis not present

## 2022-08-21 DIAGNOSIS — G8929 Other chronic pain: Secondary | ICD-10-CM | POA: Diagnosis not present

## 2022-08-21 DIAGNOSIS — N186 End stage renal disease: Secondary | ICD-10-CM | POA: Diagnosis not present

## 2022-08-21 DIAGNOSIS — D631 Anemia in chronic kidney disease: Secondary | ICD-10-CM | POA: Diagnosis not present

## 2022-08-21 DIAGNOSIS — Z992 Dependence on renal dialysis: Secondary | ICD-10-CM | POA: Diagnosis not present

## 2022-08-21 DIAGNOSIS — R519 Headache, unspecified: Secondary | ICD-10-CM | POA: Diagnosis not present

## 2022-08-21 DIAGNOSIS — N2581 Secondary hyperparathyroidism of renal origin: Secondary | ICD-10-CM | POA: Diagnosis not present

## 2022-08-24 DIAGNOSIS — G8929 Other chronic pain: Secondary | ICD-10-CM | POA: Diagnosis not present

## 2022-08-24 DIAGNOSIS — R519 Headache, unspecified: Secondary | ICD-10-CM | POA: Diagnosis not present

## 2022-08-24 DIAGNOSIS — Z992 Dependence on renal dialysis: Secondary | ICD-10-CM | POA: Diagnosis not present

## 2022-08-24 DIAGNOSIS — N2581 Secondary hyperparathyroidism of renal origin: Secondary | ICD-10-CM | POA: Diagnosis not present

## 2022-08-24 DIAGNOSIS — D631 Anemia in chronic kidney disease: Secondary | ICD-10-CM | POA: Diagnosis not present

## 2022-08-24 DIAGNOSIS — N186 End stage renal disease: Secondary | ICD-10-CM | POA: Diagnosis not present

## 2022-08-24 DIAGNOSIS — R52 Pain, unspecified: Secondary | ICD-10-CM | POA: Diagnosis not present

## 2022-08-26 DIAGNOSIS — R519 Headache, unspecified: Secondary | ICD-10-CM | POA: Diagnosis not present

## 2022-08-26 DIAGNOSIS — D631 Anemia in chronic kidney disease: Secondary | ICD-10-CM | POA: Diagnosis not present

## 2022-08-26 DIAGNOSIS — N2581 Secondary hyperparathyroidism of renal origin: Secondary | ICD-10-CM | POA: Diagnosis not present

## 2022-08-26 DIAGNOSIS — G8929 Other chronic pain: Secondary | ICD-10-CM | POA: Diagnosis not present

## 2022-08-26 DIAGNOSIS — N186 End stage renal disease: Secondary | ICD-10-CM | POA: Diagnosis not present

## 2022-08-26 DIAGNOSIS — R52 Pain, unspecified: Secondary | ICD-10-CM | POA: Diagnosis not present

## 2022-08-26 DIAGNOSIS — Z992 Dependence on renal dialysis: Secondary | ICD-10-CM | POA: Diagnosis not present

## 2022-08-27 ENCOUNTER — Other Ambulatory Visit (HOSPITAL_COMMUNITY): Payer: Self-pay

## 2022-08-27 MED ORDER — BUPROPION HCL ER (XL) 300 MG PO TB24
300.0000 mg | ORAL_TABLET | Freq: Every morning | ORAL | 1 refills | Status: DC
  Filled 2022-08-27: qty 90, 90d supply, fill #0

## 2022-08-27 MED ORDER — LORAZEPAM 2 MG PO TABS
2.0000 mg | ORAL_TABLET | Freq: Two times a day (BID) | ORAL | 5 refills | Status: DC | PRN
  Filled 2022-10-03: qty 40, 20d supply, fill #0
  Filled 2022-10-04: qty 20, 10d supply, fill #0
  Filled 2022-11-22: qty 60, 30d supply, fill #1
  Filled 2023-01-07: qty 60, 30d supply, fill #2
  Filled 2023-02-15: qty 60, 30d supply, fill #3

## 2022-08-27 MED ORDER — FLUOXETINE HCL 20 MG PO CAPS
20.0000 mg | ORAL_CAPSULE | Freq: Every morning | ORAL | 1 refills | Status: DC
  Filled 2022-08-27 – 2022-10-03 (×2): qty 90, 90d supply, fill #0
  Filled 2023-01-07: qty 90, 90d supply, fill #1

## 2022-08-28 DIAGNOSIS — R519 Headache, unspecified: Secondary | ICD-10-CM | POA: Diagnosis not present

## 2022-08-28 DIAGNOSIS — D631 Anemia in chronic kidney disease: Secondary | ICD-10-CM | POA: Diagnosis not present

## 2022-08-28 DIAGNOSIS — N2581 Secondary hyperparathyroidism of renal origin: Secondary | ICD-10-CM | POA: Diagnosis not present

## 2022-08-28 DIAGNOSIS — Z992 Dependence on renal dialysis: Secondary | ICD-10-CM | POA: Diagnosis not present

## 2022-08-28 DIAGNOSIS — G8929 Other chronic pain: Secondary | ICD-10-CM | POA: Diagnosis not present

## 2022-08-28 DIAGNOSIS — R52 Pain, unspecified: Secondary | ICD-10-CM | POA: Diagnosis not present

## 2022-08-28 DIAGNOSIS — N186 End stage renal disease: Secondary | ICD-10-CM | POA: Diagnosis not present

## 2022-08-29 DIAGNOSIS — M542 Cervicalgia: Secondary | ICD-10-CM | POA: Diagnosis not present

## 2022-08-31 ENCOUNTER — Other Ambulatory Visit (HOSPITAL_COMMUNITY): Payer: Self-pay

## 2022-08-31 DIAGNOSIS — R52 Pain, unspecified: Secondary | ICD-10-CM | POA: Diagnosis not present

## 2022-08-31 DIAGNOSIS — N2581 Secondary hyperparathyroidism of renal origin: Secondary | ICD-10-CM | POA: Diagnosis not present

## 2022-08-31 DIAGNOSIS — D631 Anemia in chronic kidney disease: Secondary | ICD-10-CM | POA: Diagnosis not present

## 2022-08-31 DIAGNOSIS — G8929 Other chronic pain: Secondary | ICD-10-CM | POA: Diagnosis not present

## 2022-08-31 DIAGNOSIS — N186 End stage renal disease: Secondary | ICD-10-CM | POA: Diagnosis not present

## 2022-08-31 DIAGNOSIS — R519 Headache, unspecified: Secondary | ICD-10-CM | POA: Diagnosis not present

## 2022-08-31 DIAGNOSIS — Z992 Dependence on renal dialysis: Secondary | ICD-10-CM | POA: Diagnosis not present

## 2022-08-31 MED ORDER — RSVPREF3 VAC RECOMB ADJUVANTED 120 MCG/0.5ML IM SUSR
0.5000 mL | Freq: Every day | INTRAMUSCULAR | 0 refills | Status: DC
  Filled 2022-08-31: qty 0.5, 1d supply, fill #0

## 2022-08-31 MED ORDER — INFLUENZA VAC A&B SA ADJ QUAD 0.5 ML IM PRSY
0.5000 mL | PREFILLED_SYRINGE | INTRAMUSCULAR | 0 refills | Status: DC
  Filled 2022-08-31: qty 0.5, 1d supply, fill #0

## 2022-09-01 DIAGNOSIS — Z96611 Presence of right artificial shoulder joint: Secondary | ICD-10-CM | POA: Diagnosis not present

## 2022-09-02 ENCOUNTER — Other Ambulatory Visit (HOSPITAL_COMMUNITY): Payer: Self-pay

## 2022-09-02 DIAGNOSIS — R52 Pain, unspecified: Secondary | ICD-10-CM | POA: Diagnosis not present

## 2022-09-02 DIAGNOSIS — R519 Headache, unspecified: Secondary | ICD-10-CM | POA: Diagnosis not present

## 2022-09-02 DIAGNOSIS — G8929 Other chronic pain: Secondary | ICD-10-CM | POA: Diagnosis not present

## 2022-09-02 DIAGNOSIS — N186 End stage renal disease: Secondary | ICD-10-CM | POA: Diagnosis not present

## 2022-09-02 DIAGNOSIS — N2581 Secondary hyperparathyroidism of renal origin: Secondary | ICD-10-CM | POA: Diagnosis not present

## 2022-09-02 DIAGNOSIS — Z992 Dependence on renal dialysis: Secondary | ICD-10-CM | POA: Diagnosis not present

## 2022-09-02 DIAGNOSIS — D631 Anemia in chronic kidney disease: Secondary | ICD-10-CM | POA: Diagnosis not present

## 2022-09-03 DIAGNOSIS — N186 End stage renal disease: Secondary | ICD-10-CM | POA: Diagnosis not present

## 2022-09-03 DIAGNOSIS — I871 Compression of vein: Secondary | ICD-10-CM | POA: Diagnosis not present

## 2022-09-03 DIAGNOSIS — Z992 Dependence on renal dialysis: Secondary | ICD-10-CM | POA: Diagnosis not present

## 2022-09-03 DIAGNOSIS — T82858A Stenosis of vascular prosthetic devices, implants and grafts, initial encounter: Secondary | ICD-10-CM | POA: Diagnosis not present

## 2022-09-04 DIAGNOSIS — N2581 Secondary hyperparathyroidism of renal origin: Secondary | ICD-10-CM | POA: Diagnosis not present

## 2022-09-04 DIAGNOSIS — D631 Anemia in chronic kidney disease: Secondary | ICD-10-CM | POA: Diagnosis not present

## 2022-09-04 DIAGNOSIS — N186 End stage renal disease: Secondary | ICD-10-CM | POA: Diagnosis not present

## 2022-09-04 DIAGNOSIS — Z992 Dependence on renal dialysis: Secondary | ICD-10-CM | POA: Diagnosis not present

## 2022-09-04 DIAGNOSIS — R519 Headache, unspecified: Secondary | ICD-10-CM | POA: Diagnosis not present

## 2022-09-04 DIAGNOSIS — G8929 Other chronic pain: Secondary | ICD-10-CM | POA: Diagnosis not present

## 2022-09-04 DIAGNOSIS — R52 Pain, unspecified: Secondary | ICD-10-CM | POA: Diagnosis not present

## 2022-09-06 DIAGNOSIS — Z471 Aftercare following joint replacement surgery: Secondary | ICD-10-CM | POA: Diagnosis not present

## 2022-09-06 DIAGNOSIS — Z96611 Presence of right artificial shoulder joint: Secondary | ICD-10-CM | POA: Diagnosis not present

## 2022-09-07 ENCOUNTER — Other Ambulatory Visit (HOSPITAL_COMMUNITY): Payer: Self-pay

## 2022-09-07 DIAGNOSIS — Z992 Dependence on renal dialysis: Secondary | ICD-10-CM | POA: Diagnosis not present

## 2022-09-07 DIAGNOSIS — R519 Headache, unspecified: Secondary | ICD-10-CM | POA: Diagnosis not present

## 2022-09-07 DIAGNOSIS — R52 Pain, unspecified: Secondary | ICD-10-CM | POA: Diagnosis not present

## 2022-09-07 DIAGNOSIS — N186 End stage renal disease: Secondary | ICD-10-CM | POA: Diagnosis not present

## 2022-09-07 DIAGNOSIS — G8929 Other chronic pain: Secondary | ICD-10-CM | POA: Diagnosis not present

## 2022-09-07 DIAGNOSIS — N2581 Secondary hyperparathyroidism of renal origin: Secondary | ICD-10-CM | POA: Diagnosis not present

## 2022-09-07 DIAGNOSIS — D631 Anemia in chronic kidney disease: Secondary | ICD-10-CM | POA: Diagnosis not present

## 2022-09-09 ENCOUNTER — Other Ambulatory Visit (HOSPITAL_COMMUNITY): Payer: Self-pay

## 2022-09-09 DIAGNOSIS — D631 Anemia in chronic kidney disease: Secondary | ICD-10-CM | POA: Diagnosis not present

## 2022-09-09 DIAGNOSIS — G894 Chronic pain syndrome: Secondary | ICD-10-CM | POA: Diagnosis not present

## 2022-09-09 DIAGNOSIS — M5459 Other low back pain: Secondary | ICD-10-CM | POA: Diagnosis not present

## 2022-09-09 DIAGNOSIS — R519 Headache, unspecified: Secondary | ICD-10-CM | POA: Diagnosis not present

## 2022-09-09 DIAGNOSIS — M5030 Other cervical disc degeneration, unspecified cervical region: Secondary | ICD-10-CM | POA: Diagnosis not present

## 2022-09-09 DIAGNOSIS — R52 Pain, unspecified: Secondary | ICD-10-CM | POA: Diagnosis not present

## 2022-09-09 DIAGNOSIS — Z992 Dependence on renal dialysis: Secondary | ICD-10-CM | POA: Diagnosis not present

## 2022-09-09 DIAGNOSIS — G8929 Other chronic pain: Secondary | ICD-10-CM | POA: Diagnosis not present

## 2022-09-09 DIAGNOSIS — N186 End stage renal disease: Secondary | ICD-10-CM | POA: Diagnosis not present

## 2022-09-09 DIAGNOSIS — N2581 Secondary hyperparathyroidism of renal origin: Secondary | ICD-10-CM | POA: Diagnosis not present

## 2022-09-09 MED ORDER — OXYCODONE HCL 15 MG PO TABS
15.0000 mg | ORAL_TABLET | Freq: Four times a day (QID) | ORAL | 0 refills | Status: DC
  Filled 2022-09-09: qty 120, 30d supply, fill #0

## 2022-09-11 ENCOUNTER — Other Ambulatory Visit: Payer: Self-pay | Admitting: Internal Medicine

## 2022-09-11 DIAGNOSIS — R52 Pain, unspecified: Secondary | ICD-10-CM | POA: Diagnosis not present

## 2022-09-11 DIAGNOSIS — Z992 Dependence on renal dialysis: Secondary | ICD-10-CM | POA: Diagnosis not present

## 2022-09-11 DIAGNOSIS — R519 Headache, unspecified: Secondary | ICD-10-CM | POA: Diagnosis not present

## 2022-09-11 DIAGNOSIS — N2581 Secondary hyperparathyroidism of renal origin: Secondary | ICD-10-CM | POA: Diagnosis not present

## 2022-09-11 DIAGNOSIS — G8929 Other chronic pain: Secondary | ICD-10-CM | POA: Diagnosis not present

## 2022-09-11 DIAGNOSIS — D631 Anemia in chronic kidney disease: Secondary | ICD-10-CM | POA: Diagnosis not present

## 2022-09-11 DIAGNOSIS — N186 End stage renal disease: Secondary | ICD-10-CM | POA: Diagnosis not present

## 2022-09-11 DIAGNOSIS — E1022 Type 1 diabetes mellitus with diabetic chronic kidney disease: Secondary | ICD-10-CM | POA: Diagnosis not present

## 2022-09-14 DIAGNOSIS — Z992 Dependence on renal dialysis: Secondary | ICD-10-CM | POA: Diagnosis not present

## 2022-09-14 DIAGNOSIS — N2581 Secondary hyperparathyroidism of renal origin: Secondary | ICD-10-CM | POA: Diagnosis not present

## 2022-09-14 DIAGNOSIS — R519 Headache, unspecified: Secondary | ICD-10-CM | POA: Diagnosis not present

## 2022-09-14 DIAGNOSIS — D631 Anemia in chronic kidney disease: Secondary | ICD-10-CM | POA: Diagnosis not present

## 2022-09-14 DIAGNOSIS — R52 Pain, unspecified: Secondary | ICD-10-CM | POA: Diagnosis not present

## 2022-09-14 DIAGNOSIS — N186 End stage renal disease: Secondary | ICD-10-CM | POA: Diagnosis not present

## 2022-09-16 DIAGNOSIS — D631 Anemia in chronic kidney disease: Secondary | ICD-10-CM | POA: Diagnosis not present

## 2022-09-16 DIAGNOSIS — N2581 Secondary hyperparathyroidism of renal origin: Secondary | ICD-10-CM | POA: Diagnosis not present

## 2022-09-16 DIAGNOSIS — Z992 Dependence on renal dialysis: Secondary | ICD-10-CM | POA: Diagnosis not present

## 2022-09-16 DIAGNOSIS — N186 End stage renal disease: Secondary | ICD-10-CM | POA: Diagnosis not present

## 2022-09-16 DIAGNOSIS — R52 Pain, unspecified: Secondary | ICD-10-CM | POA: Diagnosis not present

## 2022-09-16 DIAGNOSIS — R519 Headache, unspecified: Secondary | ICD-10-CM | POA: Diagnosis not present

## 2022-09-18 DIAGNOSIS — Z992 Dependence on renal dialysis: Secondary | ICD-10-CM | POA: Diagnosis not present

## 2022-09-18 DIAGNOSIS — N2581 Secondary hyperparathyroidism of renal origin: Secondary | ICD-10-CM | POA: Diagnosis not present

## 2022-09-18 DIAGNOSIS — R519 Headache, unspecified: Secondary | ICD-10-CM | POA: Diagnosis not present

## 2022-09-18 DIAGNOSIS — D631 Anemia in chronic kidney disease: Secondary | ICD-10-CM | POA: Diagnosis not present

## 2022-09-18 DIAGNOSIS — N186 End stage renal disease: Secondary | ICD-10-CM | POA: Diagnosis not present

## 2022-09-18 DIAGNOSIS — R52 Pain, unspecified: Secondary | ICD-10-CM | POA: Diagnosis not present

## 2022-09-21 DIAGNOSIS — N186 End stage renal disease: Secondary | ICD-10-CM | POA: Diagnosis not present

## 2022-09-21 DIAGNOSIS — D631 Anemia in chronic kidney disease: Secondary | ICD-10-CM | POA: Diagnosis not present

## 2022-09-21 DIAGNOSIS — N2581 Secondary hyperparathyroidism of renal origin: Secondary | ICD-10-CM | POA: Diagnosis not present

## 2022-09-21 DIAGNOSIS — R519 Headache, unspecified: Secondary | ICD-10-CM | POA: Diagnosis not present

## 2022-09-21 DIAGNOSIS — Z992 Dependence on renal dialysis: Secondary | ICD-10-CM | POA: Diagnosis not present

## 2022-09-21 DIAGNOSIS — R52 Pain, unspecified: Secondary | ICD-10-CM | POA: Diagnosis not present

## 2022-09-23 DIAGNOSIS — R52 Pain, unspecified: Secondary | ICD-10-CM | POA: Diagnosis not present

## 2022-09-23 DIAGNOSIS — R519 Headache, unspecified: Secondary | ICD-10-CM | POA: Diagnosis not present

## 2022-09-23 DIAGNOSIS — N2581 Secondary hyperparathyroidism of renal origin: Secondary | ICD-10-CM | POA: Diagnosis not present

## 2022-09-23 DIAGNOSIS — N186 End stage renal disease: Secondary | ICD-10-CM | POA: Diagnosis not present

## 2022-09-23 DIAGNOSIS — Z992 Dependence on renal dialysis: Secondary | ICD-10-CM | POA: Diagnosis not present

## 2022-09-23 DIAGNOSIS — D631 Anemia in chronic kidney disease: Secondary | ICD-10-CM | POA: Diagnosis not present

## 2022-09-25 DIAGNOSIS — N2581 Secondary hyperparathyroidism of renal origin: Secondary | ICD-10-CM | POA: Diagnosis not present

## 2022-09-25 DIAGNOSIS — N186 End stage renal disease: Secondary | ICD-10-CM | POA: Diagnosis not present

## 2022-09-25 DIAGNOSIS — D631 Anemia in chronic kidney disease: Secondary | ICD-10-CM | POA: Diagnosis not present

## 2022-09-25 DIAGNOSIS — Z992 Dependence on renal dialysis: Secondary | ICD-10-CM | POA: Diagnosis not present

## 2022-09-25 DIAGNOSIS — R52 Pain, unspecified: Secondary | ICD-10-CM | POA: Diagnosis not present

## 2022-09-25 DIAGNOSIS — R519 Headache, unspecified: Secondary | ICD-10-CM | POA: Diagnosis not present

## 2022-09-28 DIAGNOSIS — D631 Anemia in chronic kidney disease: Secondary | ICD-10-CM | POA: Diagnosis not present

## 2022-09-28 DIAGNOSIS — R52 Pain, unspecified: Secondary | ICD-10-CM | POA: Diagnosis not present

## 2022-09-28 DIAGNOSIS — N186 End stage renal disease: Secondary | ICD-10-CM | POA: Diagnosis not present

## 2022-09-28 DIAGNOSIS — R519 Headache, unspecified: Secondary | ICD-10-CM | POA: Diagnosis not present

## 2022-09-28 DIAGNOSIS — N2581 Secondary hyperparathyroidism of renal origin: Secondary | ICD-10-CM | POA: Diagnosis not present

## 2022-09-28 DIAGNOSIS — Z992 Dependence on renal dialysis: Secondary | ICD-10-CM | POA: Diagnosis not present

## 2022-09-29 ENCOUNTER — Ambulatory Visit (INDEPENDENT_AMBULATORY_CARE_PROVIDER_SITE_OTHER): Payer: Medicare Other | Admitting: Internal Medicine

## 2022-09-29 ENCOUNTER — Encounter: Payer: Self-pay | Admitting: Internal Medicine

## 2022-09-29 DIAGNOSIS — F419 Anxiety disorder, unspecified: Secondary | ICD-10-CM | POA: Diagnosis not present

## 2022-09-29 DIAGNOSIS — R252 Cramp and spasm: Secondary | ICD-10-CM

## 2022-09-29 DIAGNOSIS — E1129 Type 2 diabetes mellitus with other diabetic kidney complication: Secondary | ICD-10-CM | POA: Diagnosis not present

## 2022-09-29 DIAGNOSIS — W19XXXA Unspecified fall, initial encounter: Secondary | ICD-10-CM | POA: Insufficient documentation

## 2022-09-29 DIAGNOSIS — I10 Essential (primary) hypertension: Secondary | ICD-10-CM | POA: Diagnosis not present

## 2022-09-29 DIAGNOSIS — K559 Vascular disorder of intestine, unspecified: Secondary | ICD-10-CM

## 2022-09-29 DIAGNOSIS — R296 Repeated falls: Secondary | ICD-10-CM | POA: Insufficient documentation

## 2022-09-29 DIAGNOSIS — W19XXXD Unspecified fall, subsequent encounter: Secondary | ICD-10-CM

## 2022-09-29 NOTE — Assessment & Plan Note (Signed)
Try Magnesium Oil Spray

## 2022-09-29 NOTE — Patient Instructions (Signed)
Try Magnesium Oil Spray

## 2022-09-29 NOTE — Assessment & Plan Note (Signed)
Low BP - on Midodrin on HD days

## 2022-09-29 NOTE — Assessment & Plan Note (Signed)
F/u w/Dr Dwyane Dee

## 2022-09-29 NOTE — Assessment & Plan Note (Signed)
Recurrent Start pool exercises

## 2022-09-29 NOTE — Progress Notes (Signed)
Subjective:  Patient ID: [name], female    DOB: Aug 21, 1951  Age: 71 y.o. MRN: [id number]  CC: Follow-up (3 month f/u)   HPI SHAELIN LALLEY presents for DM, HTN, ESRD on HD, OA  Outpatient Medications Prior to Visit  Medication Sig Dispense Refill   Accu-Chek Softclix Lancets lancets Use 1 lancet up to 4 times daily as directed 100 each 0   acetaminophen (TYLENOL) 500 MG tablet Take 1,000 mg by mouth 2 (two) times daily as needed for headache.     acidophilus (RISAQUAD) CAPS capsule Take 1 capsule by mouth daily.     aspirin-acetaminophen-caffeine (EXCEDRIN MIGRAINE) 250-250-65 MG tablet Take 1 tablet by mouth daily as needed for headache or migraine. Max 3 doses in one week     B Complex-C-Folic Acid (RENA-VITE PO) Take 1 tablet by mouth every morning.     blood glucose meter kit and supplies Dispense based on patient and insurance preference. Used to check blood sugar daily, DX: E11.9 OneTouch 1 each 0   Blood Glucose Monitoring Suppl (ACCU-CHEK GUIDE ME) w/Device KIT Use as directed to check blood sugar four times daily. 1 kit 0   buPROPion (WELLBUTRIN XL) 300 MG 24 hr tablet Take 1 tablet (300 mg total) by mouth in the morning. 90 tablet 1   carboxymethylcellulose (REFRESH PLUS) 0.5 % SOLN Place 1 drop into both eyes 3 (three) times daily as needed (dry eyes/irritation).     ciclopirox (PENLAC) 8 % solution Apply topically at bedtime. Apply over nail and surrounding skin daily 6.6 mL 1   cyclobenzaprine (FLEXERIL) 10 MG tablet Take 1 tablet (10 mg total) by mouth 3 (three) times daily as needed for muscle spasms. 270 tablet 0   ezetimibe (ZETIA) 10 MG tablet TAKE 1 TABLET BY MOUTH DAILY 90 tablet 3   ferric citrate (AURYXIA) 1 GM 210 MG(Fe) tablet Take 210 mg by mouth 3 (three) times daily with meals.     FLUoxetine (PROZAC) 20 MG capsule Take 1 capsule (20 mg total) by mouth in the morning. 90 capsule 1   glimepiride (AMARYL) 1 MG tablet Take 1 tablet (1 mg total) by mouth daily  with breakfast. 90 tablet 3   glucose blood (ACCU-CHEK GUIDE) test strip Use to check blood sugar up to four times daily. 50 each 0   glucose blood (TRUETEST TEST) test strip Used to check blood sugar daily, DX: E11.9 100 each 3   influenza vaccine adjuvanted (FLUAD) 0.5 ML injection Inject 0.5 mLs into the muscle. 0.5 mL 0   ketoconazole (NIZORAL) 2 % cream Apply 1 application  topically daily.     lidocaine (LIDODERM) 5 % PLACE 2 PATCHES ONTO THE SKIN  DAILY 120 patch 1   lidocaine (XYLOCAINE) 5 % ointment Apply 1 application topically 3 (three) times daily as needed for moderate pain. 90 g 0   LORazepam (ATIVAN) 2 MG tablet Take 1 tablet (2 mg total) by mouth 2 (two) times daily as needed for anxiety 60 tablet 5   midodrine (PROAMATINE) 5 MG tablet Take 1-2 tablet by mouth three times a week as directed Take 30 minutes before dialysis and another tablet during middle of dialysis treatment (Patient taking differently: Take 5 mg by mouth See admin instructions. Take 5 mg by mouth at 5:30 am  and 8:30 am on Tuesday, Thursday and Saturday.) 90 tablet 3   multivitamin-lutein (OCUVITE-LUTEIN) CAPS capsule Take 1 capsule by mouth every morning.     naloxone [facility]) nasal  spray 4 mg/0.1 mL Take by nasal route every 3 minutes until patient awakes or EMS arrives. 2 each 0   ondansetron (ZOFRAN) 4 MG tablet Take 1 tablet (4 mg total) by mouth every 8 (eight) hours as needed for nausea or vomiting. 10 tablet 0   oxyCODONE (ROXICODONE) 15 MG immediate release tablet Take 1 tablet (15 mg) by mouth 4 times a day as needed. 120 tablet 0   pantoprazole (PROTONIX) 40 MG tablet TAKE 1 TABLET BY MOUTH DAILY 100 tablet 2   pilocarpine (SALAGEN) 5 MG tablet TAKE 1 TABLET BY MOUTH TWICE  DAILY 180 tablet 3   pioglitazone (ACTOS) 15 MG tablet TAKE 1 TABLET BY MOUTH DAILY 100 tablet 2   polyethylene glycol (MIRALAX / GLYCOLAX) 17 g packet Take 17 g by mouth every 7 (seven) days.     PREVIDENT 5000 DRY MOUTH 1.1 % GEL  dental gel Place 1 application  onto teeth in the morning and at bedtime.     buPROPion (WELLBUTRIN XL) 300 MG 24 hr tablet Take 1 tablet (300 mg total) by mouth in the morning. (Patient not taking: Reported on 09/29/2022) 90 tablet 1   lidocaine-prilocaine (EMLA) cream Apply 1 application topically See admin instructions. Apply topically to port access one hour prior to dialysis, cover with plastic- Tuesday, Thursday, Saturday (Patient not taking: Reported on 09/29/2022)     oxyCODONE (ROXICODONE) 15 MG immediate release tablet Take 1 tablet (15 mg total) by mouth 4 (four) times daily as needed. (Patient not taking: Reported on 09/29/2022) 120 tablet 0   RSV vaccine recomb adjuvanted (AREXVY) 120 MCG/0.5ML injection Inject 0.5 mLs into the muscle (Patient not taking: Reported on 09/29/2022) 0.5 mL 0   No facility-administered medications prior to visit.    ROS: Review of Systems  Constitutional:  Negative for activity change, appetite change, chills, fatigue and unexpected weight change.  HENT:  Negative for congestion, mouth sores and sinus pressure.   Eyes:  Negative for visual disturbance.  Respiratory:  Negative for cough and chest tightness.   Gastrointestinal:  Negative for abdominal pain and nausea.  Genitourinary:  Negative for difficulty urinating, frequency and vaginal pain.  Musculoskeletal:  Positive for arthralgias, back pain and gait problem. Negative for myalgias.  Skin:  Negative for pallor and rash.  Neurological:  Negative for dizziness, tremors, weakness, numbness and headaches.  Psychiatric/Behavioral:  Positive for decreased concentration and dysphoric mood. Negative for confusion, sleep disturbance and suicidal ideas. The patient is nervous/anxious.     Objective:  BP 118/62 (BP Location: Left Arm)   Pulse 91   Temp 98.9 F (37.2 C) (Oral)   Ht $R'5\' 2"'FK$  (1.575 m)   Wt 216 lb 12.8 oz (98.3 kg)   SpO2 95%   BMI 39.65 kg/m   BP Readings from Last 3 Encounters:   09/29/22 118/62  06/25/22 128/76  06/03/22 (!) 91/44    Wt Readings from Last 3 Encounters:  09/29/22 216 lb 12.8 oz (98.3 kg)  06/25/22 212 lb 12.8 oz (96.5 kg)  06/03/22 211 lb 6.4 oz (95.9 kg)    Physical Exam Constitutional:      General: She is not in acute distress.    Appearance: She is well-developed. She is obese.  HENT:     Head: Normocephalic.     Right Ear: External ear normal.     Left Ear: External ear normal.     Nose: Nose normal.  Eyes:     General:  Right eye: No discharge.        Left eye: No discharge.     Conjunctiva/sclera: Conjunctivae normal.     Pupils: Pupils are equal, round, and reactive to light.  Neck:     Thyroid: No thyromegaly.     Vascular: No JVD.     Trachea: No tracheal deviation.  Cardiovascular:     Rate and Rhythm: Normal rate and regular rhythm.     Heart sounds: Normal heart sounds.  Pulmonary:     Effort: No respiratory distress.     Breath sounds: No stridor. No wheezing.  Abdominal:     General: Bowel sounds are normal. There is no distension.     Palpations: Abdomen is soft. There is no mass.     Tenderness: There is no abdominal tenderness. There is no guarding or rebound.  Musculoskeletal:        General: Tenderness present.     Cervical back: Normal range of motion and neck supple. No rigidity.  Lymphadenopathy:     Cervical: No cervical adenopathy.  Skin:    Findings: No erythema or rash.  Neurological:     Mental Status: She is oriented to person, place, and time.     Cranial Nerves: No cranial nerve deficit.     Motor: No abnormal muscle tone.     Coordination: Coordination abnormal.     Gait: Gait abnormal.     Deep Tendon Reflexes: Reflexes normal.  Psychiatric:        Behavior: Behavior normal.        Thought Content: Thought content normal.        Judgment: Judgment normal.   Onycho Using a cane  Lab Results  Component Value Date   WBC 10.6 (H) 06/03/2022   HGB 10.5 (L) 06/03/2022   HCT  33.1 (L) 06/03/2022   PLT 185 06/03/2022   GLUCOSE 163 (H) 06/03/2022   CHOL 211 (H) 05/14/2022   TRIG 118 05/14/2022   HDL 55 05/14/2022   LDLCALC 135 (H) 05/14/2022   ALT 21 03/10/2022   AST 24 03/10/2022   NA 137 06/03/2022   K 3.5 06/03/2022   CL 92 (L) 06/03/2022   CREATININE 3.30 (H) 06/03/2022   BUN 34 (H) 06/03/2022   CO2 32 06/03/2022   TSH 3.98 04/16/2020   INR 1.1 03/10/2022   HGBA1C 6.3 (H) 03/10/2022    No results found.  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     Follow-up: Return in about 6 months (around 03/31/2023) for Wellness Exam.  [name], MD

## 2022-09-29 NOTE — Assessment & Plan Note (Signed)
No relapse 

## 2022-09-29 NOTE — Assessment & Plan Note (Signed)
On Lorazepam prn

## 2022-09-30 DIAGNOSIS — N186 End stage renal disease: Secondary | ICD-10-CM | POA: Diagnosis not present

## 2022-09-30 DIAGNOSIS — D631 Anemia in chronic kidney disease: Secondary | ICD-10-CM | POA: Diagnosis not present

## 2022-09-30 DIAGNOSIS — R519 Headache, unspecified: Secondary | ICD-10-CM | POA: Diagnosis not present

## 2022-09-30 DIAGNOSIS — R52 Pain, unspecified: Secondary | ICD-10-CM | POA: Diagnosis not present

## 2022-09-30 DIAGNOSIS — N2581 Secondary hyperparathyroidism of renal origin: Secondary | ICD-10-CM | POA: Diagnosis not present

## 2022-09-30 DIAGNOSIS — Z992 Dependence on renal dialysis: Secondary | ICD-10-CM | POA: Diagnosis not present

## 2022-10-02 DIAGNOSIS — R519 Headache, unspecified: Secondary | ICD-10-CM | POA: Diagnosis not present

## 2022-10-02 DIAGNOSIS — N186 End stage renal disease: Secondary | ICD-10-CM | POA: Diagnosis not present

## 2022-10-02 DIAGNOSIS — R52 Pain, unspecified: Secondary | ICD-10-CM | POA: Diagnosis not present

## 2022-10-02 DIAGNOSIS — Z992 Dependence on renal dialysis: Secondary | ICD-10-CM | POA: Diagnosis not present

## 2022-10-02 DIAGNOSIS — N2581 Secondary hyperparathyroidism of renal origin: Secondary | ICD-10-CM | POA: Diagnosis not present

## 2022-10-02 DIAGNOSIS — D631 Anemia in chronic kidney disease: Secondary | ICD-10-CM | POA: Diagnosis not present

## 2022-10-03 ENCOUNTER — Other Ambulatory Visit: Payer: Self-pay | Admitting: Internal Medicine

## 2022-10-04 ENCOUNTER — Other Ambulatory Visit (HOSPITAL_COMMUNITY): Payer: Self-pay

## 2022-10-04 DIAGNOSIS — Z96611 Presence of right artificial shoulder joint: Secondary | ICD-10-CM | POA: Diagnosis not present

## 2022-10-04 MED ORDER — CYCLOBENZAPRINE HCL 10 MG PO TABS
10.0000 mg | ORAL_TABLET | Freq: Three times a day (TID) | ORAL | 0 refills | Status: DC | PRN
  Filled 2022-10-04: qty 270, 90d supply, fill #0

## 2022-10-05 DIAGNOSIS — N2581 Secondary hyperparathyroidism of renal origin: Secondary | ICD-10-CM | POA: Diagnosis not present

## 2022-10-05 DIAGNOSIS — N186 End stage renal disease: Secondary | ICD-10-CM | POA: Diagnosis not present

## 2022-10-05 DIAGNOSIS — D631 Anemia in chronic kidney disease: Secondary | ICD-10-CM | POA: Diagnosis not present

## 2022-10-05 DIAGNOSIS — Z992 Dependence on renal dialysis: Secondary | ICD-10-CM | POA: Diagnosis not present

## 2022-10-05 DIAGNOSIS — R52 Pain, unspecified: Secondary | ICD-10-CM | POA: Diagnosis not present

## 2022-10-05 DIAGNOSIS — R519 Headache, unspecified: Secondary | ICD-10-CM | POA: Diagnosis not present

## 2022-10-07 DIAGNOSIS — N2581 Secondary hyperparathyroidism of renal origin: Secondary | ICD-10-CM | POA: Diagnosis not present

## 2022-10-07 DIAGNOSIS — D631 Anemia in chronic kidney disease: Secondary | ICD-10-CM | POA: Diagnosis not present

## 2022-10-07 DIAGNOSIS — Z992 Dependence on renal dialysis: Secondary | ICD-10-CM | POA: Diagnosis not present

## 2022-10-07 DIAGNOSIS — N186 End stage renal disease: Secondary | ICD-10-CM | POA: Diagnosis not present

## 2022-10-07 DIAGNOSIS — R52 Pain, unspecified: Secondary | ICD-10-CM | POA: Diagnosis not present

## 2022-10-07 DIAGNOSIS — R519 Headache, unspecified: Secondary | ICD-10-CM | POA: Diagnosis not present

## 2022-10-08 ENCOUNTER — Other Ambulatory Visit (HOSPITAL_COMMUNITY): Payer: Self-pay

## 2022-10-09 DIAGNOSIS — R52 Pain, unspecified: Secondary | ICD-10-CM | POA: Diagnosis not present

## 2022-10-09 DIAGNOSIS — N186 End stage renal disease: Secondary | ICD-10-CM | POA: Diagnosis not present

## 2022-10-09 DIAGNOSIS — R519 Headache, unspecified: Secondary | ICD-10-CM | POA: Diagnosis not present

## 2022-10-09 DIAGNOSIS — Z992 Dependence on renal dialysis: Secondary | ICD-10-CM | POA: Diagnosis not present

## 2022-10-09 DIAGNOSIS — D631 Anemia in chronic kidney disease: Secondary | ICD-10-CM | POA: Diagnosis not present

## 2022-10-09 DIAGNOSIS — N2581 Secondary hyperparathyroidism of renal origin: Secondary | ICD-10-CM | POA: Diagnosis not present

## 2022-10-11 ENCOUNTER — Other Ambulatory Visit (HOSPITAL_COMMUNITY): Payer: Self-pay

## 2022-10-11 MED ORDER — OXYCODONE HCL 15 MG PO TABS
15.0000 mg | ORAL_TABLET | Freq: Four times a day (QID) | ORAL | 0 refills | Status: DC | PRN
  Filled 2022-10-11 – 2022-10-13 (×2): qty 120, 30d supply, fill #0

## 2022-10-12 ENCOUNTER — Other Ambulatory Visit (HOSPITAL_COMMUNITY): Payer: Self-pay

## 2022-10-12 DIAGNOSIS — N2581 Secondary hyperparathyroidism of renal origin: Secondary | ICD-10-CM | POA: Diagnosis not present

## 2022-10-12 DIAGNOSIS — R519 Headache, unspecified: Secondary | ICD-10-CM | POA: Diagnosis not present

## 2022-10-12 DIAGNOSIS — R1030 Lower abdominal pain, unspecified: Secondary | ICD-10-CM | POA: Insufficient documentation

## 2022-10-12 DIAGNOSIS — N186 End stage renal disease: Secondary | ICD-10-CM | POA: Diagnosis not present

## 2022-10-12 DIAGNOSIS — E1022 Type 1 diabetes mellitus with diabetic chronic kidney disease: Secondary | ICD-10-CM | POA: Diagnosis not present

## 2022-10-12 DIAGNOSIS — R52 Pain, unspecified: Secondary | ICD-10-CM | POA: Diagnosis not present

## 2022-10-12 DIAGNOSIS — D631 Anemia in chronic kidney disease: Secondary | ICD-10-CM | POA: Diagnosis not present

## 2022-10-12 DIAGNOSIS — Z992 Dependence on renal dialysis: Secondary | ICD-10-CM | POA: Diagnosis not present

## 2022-10-12 DIAGNOSIS — M25552 Pain in left hip: Secondary | ICD-10-CM | POA: Diagnosis not present

## 2022-10-12 MED ORDER — METHYLPREDNISOLONE 4 MG PO TBPK
ORAL_TABLET | ORAL | 0 refills | Status: DC
  Filled 2022-10-12: qty 21, 6d supply, fill #0

## 2022-10-13 ENCOUNTER — Other Ambulatory Visit (HOSPITAL_COMMUNITY): Payer: Self-pay

## 2022-10-14 DIAGNOSIS — N186 End stage renal disease: Secondary | ICD-10-CM | POA: Diagnosis not present

## 2022-10-14 DIAGNOSIS — R52 Pain, unspecified: Secondary | ICD-10-CM | POA: Diagnosis not present

## 2022-10-14 DIAGNOSIS — Z992 Dependence on renal dialysis: Secondary | ICD-10-CM | POA: Diagnosis not present

## 2022-10-14 DIAGNOSIS — N2581 Secondary hyperparathyroidism of renal origin: Secondary | ICD-10-CM | POA: Diagnosis not present

## 2022-10-14 DIAGNOSIS — R519 Headache, unspecified: Secondary | ICD-10-CM | POA: Diagnosis not present

## 2022-10-14 DIAGNOSIS — D631 Anemia in chronic kidney disease: Secondary | ICD-10-CM | POA: Diagnosis not present

## 2022-10-16 DIAGNOSIS — N2581 Secondary hyperparathyroidism of renal origin: Secondary | ICD-10-CM | POA: Diagnosis not present

## 2022-10-16 DIAGNOSIS — N186 End stage renal disease: Secondary | ICD-10-CM | POA: Diagnosis not present

## 2022-10-16 DIAGNOSIS — R519 Headache, unspecified: Secondary | ICD-10-CM | POA: Diagnosis not present

## 2022-10-16 DIAGNOSIS — D631 Anemia in chronic kidney disease: Secondary | ICD-10-CM | POA: Diagnosis not present

## 2022-10-16 DIAGNOSIS — R52 Pain, unspecified: Secondary | ICD-10-CM | POA: Diagnosis not present

## 2022-10-16 DIAGNOSIS — Z992 Dependence on renal dialysis: Secondary | ICD-10-CM | POA: Diagnosis not present

## 2022-10-19 DIAGNOSIS — Z992 Dependence on renal dialysis: Secondary | ICD-10-CM | POA: Diagnosis not present

## 2022-10-19 DIAGNOSIS — D631 Anemia in chronic kidney disease: Secondary | ICD-10-CM | POA: Diagnosis not present

## 2022-10-19 DIAGNOSIS — R52 Pain, unspecified: Secondary | ICD-10-CM | POA: Diagnosis not present

## 2022-10-19 DIAGNOSIS — N186 End stage renal disease: Secondary | ICD-10-CM | POA: Diagnosis not present

## 2022-10-19 DIAGNOSIS — N2581 Secondary hyperparathyroidism of renal origin: Secondary | ICD-10-CM | POA: Diagnosis not present

## 2022-10-19 DIAGNOSIS — R519 Headache, unspecified: Secondary | ICD-10-CM | POA: Diagnosis not present

## 2022-10-21 DIAGNOSIS — N2581 Secondary hyperparathyroidism of renal origin: Secondary | ICD-10-CM | POA: Diagnosis not present

## 2022-10-21 DIAGNOSIS — N186 End stage renal disease: Secondary | ICD-10-CM | POA: Diagnosis not present

## 2022-10-21 DIAGNOSIS — Z992 Dependence on renal dialysis: Secondary | ICD-10-CM | POA: Diagnosis not present

## 2022-10-21 DIAGNOSIS — R519 Headache, unspecified: Secondary | ICD-10-CM | POA: Diagnosis not present

## 2022-10-21 DIAGNOSIS — R52 Pain, unspecified: Secondary | ICD-10-CM | POA: Diagnosis not present

## 2022-10-21 DIAGNOSIS — D631 Anemia in chronic kidney disease: Secondary | ICD-10-CM | POA: Diagnosis not present

## 2022-10-23 DIAGNOSIS — N186 End stage renal disease: Secondary | ICD-10-CM | POA: Diagnosis not present

## 2022-10-23 DIAGNOSIS — N2581 Secondary hyperparathyroidism of renal origin: Secondary | ICD-10-CM | POA: Diagnosis not present

## 2022-10-23 DIAGNOSIS — Z992 Dependence on renal dialysis: Secondary | ICD-10-CM | POA: Diagnosis not present

## 2022-10-23 DIAGNOSIS — R519 Headache, unspecified: Secondary | ICD-10-CM | POA: Diagnosis not present

## 2022-10-23 DIAGNOSIS — D631 Anemia in chronic kidney disease: Secondary | ICD-10-CM | POA: Diagnosis not present

## 2022-10-23 DIAGNOSIS — R52 Pain, unspecified: Secondary | ICD-10-CM | POA: Diagnosis not present

## 2022-10-25 ENCOUNTER — Telehealth: Payer: Medicare Other

## 2022-10-26 DIAGNOSIS — D631 Anemia in chronic kidney disease: Secondary | ICD-10-CM | POA: Diagnosis not present

## 2022-10-26 DIAGNOSIS — R52 Pain, unspecified: Secondary | ICD-10-CM | POA: Diagnosis not present

## 2022-10-26 DIAGNOSIS — N2581 Secondary hyperparathyroidism of renal origin: Secondary | ICD-10-CM | POA: Diagnosis not present

## 2022-10-26 DIAGNOSIS — Z992 Dependence on renal dialysis: Secondary | ICD-10-CM | POA: Diagnosis not present

## 2022-10-26 DIAGNOSIS — M25552 Pain in left hip: Secondary | ICD-10-CM | POA: Diagnosis not present

## 2022-10-26 DIAGNOSIS — N186 End stage renal disease: Secondary | ICD-10-CM | POA: Diagnosis not present

## 2022-10-26 DIAGNOSIS — R519 Headache, unspecified: Secondary | ICD-10-CM | POA: Diagnosis not present

## 2022-10-28 DIAGNOSIS — R519 Headache, unspecified: Secondary | ICD-10-CM | POA: Diagnosis not present

## 2022-10-28 DIAGNOSIS — Z992 Dependence on renal dialysis: Secondary | ICD-10-CM | POA: Diagnosis not present

## 2022-10-28 DIAGNOSIS — N186 End stage renal disease: Secondary | ICD-10-CM | POA: Diagnosis not present

## 2022-10-28 DIAGNOSIS — N2581 Secondary hyperparathyroidism of renal origin: Secondary | ICD-10-CM | POA: Diagnosis not present

## 2022-10-28 DIAGNOSIS — D631 Anemia in chronic kidney disease: Secondary | ICD-10-CM | POA: Diagnosis not present

## 2022-10-28 DIAGNOSIS — R52 Pain, unspecified: Secondary | ICD-10-CM | POA: Diagnosis not present

## 2022-10-30 DIAGNOSIS — D631 Anemia in chronic kidney disease: Secondary | ICD-10-CM | POA: Diagnosis not present

## 2022-10-30 DIAGNOSIS — N186 End stage renal disease: Secondary | ICD-10-CM | POA: Diagnosis not present

## 2022-10-30 DIAGNOSIS — N2581 Secondary hyperparathyroidism of renal origin: Secondary | ICD-10-CM | POA: Diagnosis not present

## 2022-10-30 DIAGNOSIS — R52 Pain, unspecified: Secondary | ICD-10-CM | POA: Diagnosis not present

## 2022-10-30 DIAGNOSIS — Z992 Dependence on renal dialysis: Secondary | ICD-10-CM | POA: Diagnosis not present

## 2022-10-30 DIAGNOSIS — R519 Headache, unspecified: Secondary | ICD-10-CM | POA: Diagnosis not present

## 2022-11-01 DIAGNOSIS — R52 Pain, unspecified: Secondary | ICD-10-CM | POA: Diagnosis not present

## 2022-11-01 DIAGNOSIS — N186 End stage renal disease: Secondary | ICD-10-CM | POA: Diagnosis not present

## 2022-11-01 DIAGNOSIS — D631 Anemia in chronic kidney disease: Secondary | ICD-10-CM | POA: Diagnosis not present

## 2022-11-01 DIAGNOSIS — R519 Headache, unspecified: Secondary | ICD-10-CM | POA: Diagnosis not present

## 2022-11-01 DIAGNOSIS — N2581 Secondary hyperparathyroidism of renal origin: Secondary | ICD-10-CM | POA: Diagnosis not present

## 2022-11-01 DIAGNOSIS — Z992 Dependence on renal dialysis: Secondary | ICD-10-CM | POA: Diagnosis not present

## 2022-11-03 DIAGNOSIS — D631 Anemia in chronic kidney disease: Secondary | ICD-10-CM | POA: Diagnosis not present

## 2022-11-03 DIAGNOSIS — R52 Pain, unspecified: Secondary | ICD-10-CM | POA: Diagnosis not present

## 2022-11-03 DIAGNOSIS — N2581 Secondary hyperparathyroidism of renal origin: Secondary | ICD-10-CM | POA: Diagnosis not present

## 2022-11-03 DIAGNOSIS — N186 End stage renal disease: Secondary | ICD-10-CM | POA: Diagnosis not present

## 2022-11-03 DIAGNOSIS — Z992 Dependence on renal dialysis: Secondary | ICD-10-CM | POA: Diagnosis not present

## 2022-11-03 DIAGNOSIS — R519 Headache, unspecified: Secondary | ICD-10-CM | POA: Diagnosis not present

## 2022-11-06 DIAGNOSIS — R52 Pain, unspecified: Secondary | ICD-10-CM | POA: Diagnosis not present

## 2022-11-06 DIAGNOSIS — N2581 Secondary hyperparathyroidism of renal origin: Secondary | ICD-10-CM | POA: Diagnosis not present

## 2022-11-06 DIAGNOSIS — D631 Anemia in chronic kidney disease: Secondary | ICD-10-CM | POA: Diagnosis not present

## 2022-11-06 DIAGNOSIS — Z992 Dependence on renal dialysis: Secondary | ICD-10-CM | POA: Diagnosis not present

## 2022-11-06 DIAGNOSIS — N186 End stage renal disease: Secondary | ICD-10-CM | POA: Diagnosis not present

## 2022-11-06 DIAGNOSIS — R519 Headache, unspecified: Secondary | ICD-10-CM | POA: Diagnosis not present

## 2022-11-08 ENCOUNTER — Other Ambulatory Visit (HOSPITAL_COMMUNITY): Payer: Self-pay

## 2022-11-09 DIAGNOSIS — R52 Pain, unspecified: Secondary | ICD-10-CM | POA: Diagnosis not present

## 2022-11-09 DIAGNOSIS — Z992 Dependence on renal dialysis: Secondary | ICD-10-CM | POA: Diagnosis not present

## 2022-11-09 DIAGNOSIS — R519 Headache, unspecified: Secondary | ICD-10-CM | POA: Diagnosis not present

## 2022-11-09 DIAGNOSIS — N186 End stage renal disease: Secondary | ICD-10-CM | POA: Diagnosis not present

## 2022-11-09 DIAGNOSIS — N2581 Secondary hyperparathyroidism of renal origin: Secondary | ICD-10-CM | POA: Diagnosis not present

## 2022-11-09 DIAGNOSIS — D631 Anemia in chronic kidney disease: Secondary | ICD-10-CM | POA: Diagnosis not present

## 2022-11-11 DIAGNOSIS — D631 Anemia in chronic kidney disease: Secondary | ICD-10-CM | POA: Diagnosis not present

## 2022-11-11 DIAGNOSIS — N2581 Secondary hyperparathyroidism of renal origin: Secondary | ICD-10-CM | POA: Diagnosis not present

## 2022-11-11 DIAGNOSIS — R52 Pain, unspecified: Secondary | ICD-10-CM | POA: Diagnosis not present

## 2022-11-11 DIAGNOSIS — N186 End stage renal disease: Secondary | ICD-10-CM | POA: Diagnosis not present

## 2022-11-11 DIAGNOSIS — E1022 Type 1 diabetes mellitus with diabetic chronic kidney disease: Secondary | ICD-10-CM | POA: Diagnosis not present

## 2022-11-11 DIAGNOSIS — Z992 Dependence on renal dialysis: Secondary | ICD-10-CM | POA: Diagnosis not present

## 2022-11-11 DIAGNOSIS — R519 Headache, unspecified: Secondary | ICD-10-CM | POA: Diagnosis not present

## 2022-11-12 ENCOUNTER — Other Ambulatory Visit (HOSPITAL_COMMUNITY): Payer: Self-pay

## 2022-11-12 MED ORDER — OXYCODONE HCL 15 MG PO TABS
15.0000 mg | ORAL_TABLET | Freq: Four times a day (QID) | ORAL | 0 refills | Status: DC | PRN
  Filled 2022-11-12: qty 120, 30d supply, fill #0

## 2022-11-13 DIAGNOSIS — N186 End stage renal disease: Secondary | ICD-10-CM | POA: Diagnosis not present

## 2022-11-13 DIAGNOSIS — Z992 Dependence on renal dialysis: Secondary | ICD-10-CM | POA: Diagnosis not present

## 2022-11-13 DIAGNOSIS — R52 Pain, unspecified: Secondary | ICD-10-CM | POA: Diagnosis not present

## 2022-11-13 DIAGNOSIS — R519 Headache, unspecified: Secondary | ICD-10-CM | POA: Diagnosis not present

## 2022-11-13 DIAGNOSIS — D631 Anemia in chronic kidney disease: Secondary | ICD-10-CM | POA: Diagnosis not present

## 2022-11-13 DIAGNOSIS — N2581 Secondary hyperparathyroidism of renal origin: Secondary | ICD-10-CM | POA: Diagnosis not present

## 2022-11-16 DIAGNOSIS — N186 End stage renal disease: Secondary | ICD-10-CM | POA: Diagnosis not present

## 2022-11-16 DIAGNOSIS — R519 Headache, unspecified: Secondary | ICD-10-CM | POA: Diagnosis not present

## 2022-11-16 DIAGNOSIS — D631 Anemia in chronic kidney disease: Secondary | ICD-10-CM | POA: Diagnosis not present

## 2022-11-16 DIAGNOSIS — N2581 Secondary hyperparathyroidism of renal origin: Secondary | ICD-10-CM | POA: Diagnosis not present

## 2022-11-16 DIAGNOSIS — Z992 Dependence on renal dialysis: Secondary | ICD-10-CM | POA: Diagnosis not present

## 2022-11-16 DIAGNOSIS — R52 Pain, unspecified: Secondary | ICD-10-CM | POA: Diagnosis not present

## 2022-11-18 DIAGNOSIS — Z992 Dependence on renal dialysis: Secondary | ICD-10-CM | POA: Diagnosis not present

## 2022-11-18 DIAGNOSIS — R519 Headache, unspecified: Secondary | ICD-10-CM | POA: Diagnosis not present

## 2022-11-18 DIAGNOSIS — D631 Anemia in chronic kidney disease: Secondary | ICD-10-CM | POA: Diagnosis not present

## 2022-11-18 DIAGNOSIS — R52 Pain, unspecified: Secondary | ICD-10-CM | POA: Diagnosis not present

## 2022-11-18 DIAGNOSIS — N186 End stage renal disease: Secondary | ICD-10-CM | POA: Diagnosis not present

## 2022-11-18 DIAGNOSIS — N2581 Secondary hyperparathyroidism of renal origin: Secondary | ICD-10-CM | POA: Diagnosis not present

## 2022-11-20 DIAGNOSIS — N186 End stage renal disease: Secondary | ICD-10-CM | POA: Diagnosis not present

## 2022-11-20 DIAGNOSIS — N2581 Secondary hyperparathyroidism of renal origin: Secondary | ICD-10-CM | POA: Diagnosis not present

## 2022-11-20 DIAGNOSIS — Z992 Dependence on renal dialysis: Secondary | ICD-10-CM | POA: Diagnosis not present

## 2022-11-20 DIAGNOSIS — D631 Anemia in chronic kidney disease: Secondary | ICD-10-CM | POA: Diagnosis not present

## 2022-11-20 DIAGNOSIS — R52 Pain, unspecified: Secondary | ICD-10-CM | POA: Diagnosis not present

## 2022-11-20 DIAGNOSIS — R519 Headache, unspecified: Secondary | ICD-10-CM | POA: Diagnosis not present

## 2022-11-22 ENCOUNTER — Other Ambulatory Visit (HOSPITAL_COMMUNITY): Payer: Self-pay

## 2022-11-23 ENCOUNTER — Other Ambulatory Visit (HOSPITAL_COMMUNITY): Payer: Self-pay

## 2022-11-23 DIAGNOSIS — N2581 Secondary hyperparathyroidism of renal origin: Secondary | ICD-10-CM | POA: Diagnosis not present

## 2022-11-23 DIAGNOSIS — R52 Pain, unspecified: Secondary | ICD-10-CM | POA: Diagnosis not present

## 2022-11-23 DIAGNOSIS — Z992 Dependence on renal dialysis: Secondary | ICD-10-CM | POA: Diagnosis not present

## 2022-11-23 DIAGNOSIS — R519 Headache, unspecified: Secondary | ICD-10-CM | POA: Diagnosis not present

## 2022-11-23 DIAGNOSIS — N186 End stage renal disease: Secondary | ICD-10-CM | POA: Diagnosis not present

## 2022-11-23 DIAGNOSIS — D631 Anemia in chronic kidney disease: Secondary | ICD-10-CM | POA: Diagnosis not present

## 2022-11-25 DIAGNOSIS — R519 Headache, unspecified: Secondary | ICD-10-CM | POA: Diagnosis not present

## 2022-11-25 DIAGNOSIS — N2581 Secondary hyperparathyroidism of renal origin: Secondary | ICD-10-CM | POA: Diagnosis not present

## 2022-11-25 DIAGNOSIS — Z992 Dependence on renal dialysis: Secondary | ICD-10-CM | POA: Diagnosis not present

## 2022-11-25 DIAGNOSIS — N186 End stage renal disease: Secondary | ICD-10-CM | POA: Diagnosis not present

## 2022-11-25 DIAGNOSIS — D631 Anemia in chronic kidney disease: Secondary | ICD-10-CM | POA: Diagnosis not present

## 2022-11-25 DIAGNOSIS — R52 Pain, unspecified: Secondary | ICD-10-CM | POA: Diagnosis not present

## 2022-11-27 DIAGNOSIS — N186 End stage renal disease: Secondary | ICD-10-CM | POA: Diagnosis not present

## 2022-11-27 DIAGNOSIS — R519 Headache, unspecified: Secondary | ICD-10-CM | POA: Diagnosis not present

## 2022-11-27 DIAGNOSIS — R52 Pain, unspecified: Secondary | ICD-10-CM | POA: Diagnosis not present

## 2022-11-27 DIAGNOSIS — D631 Anemia in chronic kidney disease: Secondary | ICD-10-CM | POA: Diagnosis not present

## 2022-11-27 DIAGNOSIS — N2581 Secondary hyperparathyroidism of renal origin: Secondary | ICD-10-CM | POA: Diagnosis not present

## 2022-11-27 DIAGNOSIS — Z992 Dependence on renal dialysis: Secondary | ICD-10-CM | POA: Diagnosis not present

## 2022-11-30 DIAGNOSIS — D631 Anemia in chronic kidney disease: Secondary | ICD-10-CM | POA: Diagnosis not present

## 2022-11-30 DIAGNOSIS — R52 Pain, unspecified: Secondary | ICD-10-CM | POA: Diagnosis not present

## 2022-11-30 DIAGNOSIS — R519 Headache, unspecified: Secondary | ICD-10-CM | POA: Diagnosis not present

## 2022-11-30 DIAGNOSIS — N2581 Secondary hyperparathyroidism of renal origin: Secondary | ICD-10-CM | POA: Diagnosis not present

## 2022-11-30 DIAGNOSIS — Z992 Dependence on renal dialysis: Secondary | ICD-10-CM | POA: Diagnosis not present

## 2022-11-30 DIAGNOSIS — N186 End stage renal disease: Secondary | ICD-10-CM | POA: Diagnosis not present

## 2022-12-02 DIAGNOSIS — R52 Pain, unspecified: Secondary | ICD-10-CM | POA: Diagnosis not present

## 2022-12-02 DIAGNOSIS — N2581 Secondary hyperparathyroidism of renal origin: Secondary | ICD-10-CM | POA: Diagnosis not present

## 2022-12-02 DIAGNOSIS — N186 End stage renal disease: Secondary | ICD-10-CM | POA: Diagnosis not present

## 2022-12-02 DIAGNOSIS — Z992 Dependence on renal dialysis: Secondary | ICD-10-CM | POA: Diagnosis not present

## 2022-12-02 DIAGNOSIS — R519 Headache, unspecified: Secondary | ICD-10-CM | POA: Diagnosis not present

## 2022-12-02 DIAGNOSIS — D631 Anemia in chronic kidney disease: Secondary | ICD-10-CM | POA: Diagnosis not present

## 2022-12-04 DIAGNOSIS — Z992 Dependence on renal dialysis: Secondary | ICD-10-CM | POA: Diagnosis not present

## 2022-12-04 DIAGNOSIS — N2581 Secondary hyperparathyroidism of renal origin: Secondary | ICD-10-CM | POA: Diagnosis not present

## 2022-12-04 DIAGNOSIS — R52 Pain, unspecified: Secondary | ICD-10-CM | POA: Diagnosis not present

## 2022-12-04 DIAGNOSIS — D631 Anemia in chronic kidney disease: Secondary | ICD-10-CM | POA: Diagnosis not present

## 2022-12-04 DIAGNOSIS — N186 End stage renal disease: Secondary | ICD-10-CM | POA: Diagnosis not present

## 2022-12-04 DIAGNOSIS — R519 Headache, unspecified: Secondary | ICD-10-CM | POA: Diagnosis not present

## 2022-12-07 DIAGNOSIS — Z992 Dependence on renal dialysis: Secondary | ICD-10-CM | POA: Diagnosis not present

## 2022-12-07 DIAGNOSIS — N186 End stage renal disease: Secondary | ICD-10-CM | POA: Diagnosis not present

## 2022-12-07 DIAGNOSIS — D631 Anemia in chronic kidney disease: Secondary | ICD-10-CM | POA: Diagnosis not present

## 2022-12-07 DIAGNOSIS — R519 Headache, unspecified: Secondary | ICD-10-CM | POA: Diagnosis not present

## 2022-12-07 DIAGNOSIS — N2581 Secondary hyperparathyroidism of renal origin: Secondary | ICD-10-CM | POA: Diagnosis not present

## 2022-12-07 DIAGNOSIS — R52 Pain, unspecified: Secondary | ICD-10-CM | POA: Diagnosis not present

## 2022-12-09 DIAGNOSIS — D631 Anemia in chronic kidney disease: Secondary | ICD-10-CM | POA: Diagnosis not present

## 2022-12-09 DIAGNOSIS — Z992 Dependence on renal dialysis: Secondary | ICD-10-CM | POA: Diagnosis not present

## 2022-12-09 DIAGNOSIS — N186 End stage renal disease: Secondary | ICD-10-CM | POA: Diagnosis not present

## 2022-12-09 DIAGNOSIS — R519 Headache, unspecified: Secondary | ICD-10-CM | POA: Diagnosis not present

## 2022-12-09 DIAGNOSIS — R52 Pain, unspecified: Secondary | ICD-10-CM | POA: Diagnosis not present

## 2022-12-09 DIAGNOSIS — N2581 Secondary hyperparathyroidism of renal origin: Secondary | ICD-10-CM | POA: Diagnosis not present

## 2022-12-11 DIAGNOSIS — R52 Pain, unspecified: Secondary | ICD-10-CM | POA: Diagnosis not present

## 2022-12-11 DIAGNOSIS — R519 Headache, unspecified: Secondary | ICD-10-CM | POA: Diagnosis not present

## 2022-12-11 DIAGNOSIS — Z992 Dependence on renal dialysis: Secondary | ICD-10-CM | POA: Diagnosis not present

## 2022-12-11 DIAGNOSIS — N2581 Secondary hyperparathyroidism of renal origin: Secondary | ICD-10-CM | POA: Diagnosis not present

## 2022-12-11 DIAGNOSIS — D631 Anemia in chronic kidney disease: Secondary | ICD-10-CM | POA: Diagnosis not present

## 2022-12-11 DIAGNOSIS — N186 End stage renal disease: Secondary | ICD-10-CM | POA: Diagnosis not present

## 2022-12-12 DIAGNOSIS — Z992 Dependence on renal dialysis: Secondary | ICD-10-CM | POA: Diagnosis not present

## 2022-12-12 DIAGNOSIS — N186 End stage renal disease: Secondary | ICD-10-CM | POA: Diagnosis not present

## 2022-12-12 DIAGNOSIS — E1022 Type 1 diabetes mellitus with diabetic chronic kidney disease: Secondary | ICD-10-CM | POA: Diagnosis not present

## 2022-12-14 ENCOUNTER — Other Ambulatory Visit (HOSPITAL_COMMUNITY): Payer: Self-pay

## 2022-12-14 DIAGNOSIS — D631 Anemia in chronic kidney disease: Secondary | ICD-10-CM | POA: Diagnosis not present

## 2022-12-14 DIAGNOSIS — G8929 Other chronic pain: Secondary | ICD-10-CM | POA: Diagnosis not present

## 2022-12-14 DIAGNOSIS — Z992 Dependence on renal dialysis: Secondary | ICD-10-CM | POA: Diagnosis not present

## 2022-12-14 DIAGNOSIS — N2581 Secondary hyperparathyroidism of renal origin: Secondary | ICD-10-CM | POA: Diagnosis not present

## 2022-12-14 DIAGNOSIS — R52 Pain, unspecified: Secondary | ICD-10-CM | POA: Diagnosis not present

## 2022-12-14 DIAGNOSIS — N186 End stage renal disease: Secondary | ICD-10-CM | POA: Diagnosis not present

## 2022-12-14 DIAGNOSIS — R519 Headache, unspecified: Secondary | ICD-10-CM | POA: Diagnosis not present

## 2022-12-14 MED ORDER — OXYCODONE HCL 15 MG PO TABS
15.0000 mg | ORAL_TABLET | Freq: Four times a day (QID) | ORAL | 0 refills | Status: DC | PRN
  Filled 2022-12-14: qty 120, 30d supply, fill #0

## 2022-12-15 ENCOUNTER — Inpatient Hospital Stay: Admission: RE | Admit: 2022-12-15 | Payer: Medicare Other | Source: Ambulatory Visit

## 2022-12-16 DIAGNOSIS — D631 Anemia in chronic kidney disease: Secondary | ICD-10-CM | POA: Diagnosis not present

## 2022-12-16 DIAGNOSIS — R52 Pain, unspecified: Secondary | ICD-10-CM | POA: Diagnosis not present

## 2022-12-16 DIAGNOSIS — G8929 Other chronic pain: Secondary | ICD-10-CM | POA: Diagnosis not present

## 2022-12-16 DIAGNOSIS — N186 End stage renal disease: Secondary | ICD-10-CM | POA: Diagnosis not present

## 2022-12-16 DIAGNOSIS — Z992 Dependence on renal dialysis: Secondary | ICD-10-CM | POA: Diagnosis not present

## 2022-12-16 DIAGNOSIS — N2581 Secondary hyperparathyroidism of renal origin: Secondary | ICD-10-CM | POA: Diagnosis not present

## 2022-12-16 DIAGNOSIS — R519 Headache, unspecified: Secondary | ICD-10-CM | POA: Diagnosis not present

## 2022-12-18 DIAGNOSIS — R52 Pain, unspecified: Secondary | ICD-10-CM | POA: Diagnosis not present

## 2022-12-18 DIAGNOSIS — D631 Anemia in chronic kidney disease: Secondary | ICD-10-CM | POA: Diagnosis not present

## 2022-12-18 DIAGNOSIS — N186 End stage renal disease: Secondary | ICD-10-CM | POA: Diagnosis not present

## 2022-12-18 DIAGNOSIS — N2581 Secondary hyperparathyroidism of renal origin: Secondary | ICD-10-CM | POA: Diagnosis not present

## 2022-12-18 DIAGNOSIS — R519 Headache, unspecified: Secondary | ICD-10-CM | POA: Diagnosis not present

## 2022-12-18 DIAGNOSIS — G8929 Other chronic pain: Secondary | ICD-10-CM | POA: Diagnosis not present

## 2022-12-18 DIAGNOSIS — Z992 Dependence on renal dialysis: Secondary | ICD-10-CM | POA: Diagnosis not present

## 2022-12-21 DIAGNOSIS — G8929 Other chronic pain: Secondary | ICD-10-CM | POA: Diagnosis not present

## 2022-12-21 DIAGNOSIS — N186 End stage renal disease: Secondary | ICD-10-CM | POA: Diagnosis not present

## 2022-12-21 DIAGNOSIS — D631 Anemia in chronic kidney disease: Secondary | ICD-10-CM | POA: Diagnosis not present

## 2022-12-21 DIAGNOSIS — Z992 Dependence on renal dialysis: Secondary | ICD-10-CM | POA: Diagnosis not present

## 2022-12-21 DIAGNOSIS — R519 Headache, unspecified: Secondary | ICD-10-CM | POA: Diagnosis not present

## 2022-12-21 DIAGNOSIS — N2581 Secondary hyperparathyroidism of renal origin: Secondary | ICD-10-CM | POA: Diagnosis not present

## 2022-12-21 DIAGNOSIS — R52 Pain, unspecified: Secondary | ICD-10-CM | POA: Diagnosis not present

## 2022-12-23 ENCOUNTER — Other Ambulatory Visit: Payer: Self-pay | Admitting: Internal Medicine

## 2022-12-23 DIAGNOSIS — N186 End stage renal disease: Secondary | ICD-10-CM | POA: Diagnosis not present

## 2022-12-23 DIAGNOSIS — G8929 Other chronic pain: Secondary | ICD-10-CM | POA: Diagnosis not present

## 2022-12-23 DIAGNOSIS — R519 Headache, unspecified: Secondary | ICD-10-CM | POA: Diagnosis not present

## 2022-12-23 DIAGNOSIS — Z992 Dependence on renal dialysis: Secondary | ICD-10-CM | POA: Diagnosis not present

## 2022-12-23 DIAGNOSIS — N2581 Secondary hyperparathyroidism of renal origin: Secondary | ICD-10-CM | POA: Diagnosis not present

## 2022-12-23 DIAGNOSIS — D631 Anemia in chronic kidney disease: Secondary | ICD-10-CM | POA: Diagnosis not present

## 2022-12-23 DIAGNOSIS — R52 Pain, unspecified: Secondary | ICD-10-CM | POA: Diagnosis not present

## 2022-12-25 DIAGNOSIS — N186 End stage renal disease: Secondary | ICD-10-CM | POA: Diagnosis not present

## 2022-12-25 DIAGNOSIS — Z992 Dependence on renal dialysis: Secondary | ICD-10-CM | POA: Diagnosis not present

## 2022-12-25 DIAGNOSIS — R519 Headache, unspecified: Secondary | ICD-10-CM | POA: Diagnosis not present

## 2022-12-25 DIAGNOSIS — N2581 Secondary hyperparathyroidism of renal origin: Secondary | ICD-10-CM | POA: Diagnosis not present

## 2022-12-25 DIAGNOSIS — R52 Pain, unspecified: Secondary | ICD-10-CM | POA: Diagnosis not present

## 2022-12-25 DIAGNOSIS — D631 Anemia in chronic kidney disease: Secondary | ICD-10-CM | POA: Diagnosis not present

## 2022-12-25 DIAGNOSIS — G8929 Other chronic pain: Secondary | ICD-10-CM | POA: Diagnosis not present

## 2022-12-28 DIAGNOSIS — Z992 Dependence on renal dialysis: Secondary | ICD-10-CM | POA: Diagnosis not present

## 2022-12-28 DIAGNOSIS — R519 Headache, unspecified: Secondary | ICD-10-CM | POA: Diagnosis not present

## 2022-12-28 DIAGNOSIS — D631 Anemia in chronic kidney disease: Secondary | ICD-10-CM | POA: Diagnosis not present

## 2022-12-28 DIAGNOSIS — G8929 Other chronic pain: Secondary | ICD-10-CM | POA: Diagnosis not present

## 2022-12-28 DIAGNOSIS — N2581 Secondary hyperparathyroidism of renal origin: Secondary | ICD-10-CM | POA: Diagnosis not present

## 2022-12-28 DIAGNOSIS — N186 End stage renal disease: Secondary | ICD-10-CM | POA: Diagnosis not present

## 2022-12-28 DIAGNOSIS — R52 Pain, unspecified: Secondary | ICD-10-CM | POA: Diagnosis not present

## 2022-12-30 DIAGNOSIS — G8929 Other chronic pain: Secondary | ICD-10-CM | POA: Diagnosis not present

## 2022-12-30 DIAGNOSIS — D631 Anemia in chronic kidney disease: Secondary | ICD-10-CM | POA: Diagnosis not present

## 2022-12-30 DIAGNOSIS — N2581 Secondary hyperparathyroidism of renal origin: Secondary | ICD-10-CM | POA: Diagnosis not present

## 2022-12-30 DIAGNOSIS — R519 Headache, unspecified: Secondary | ICD-10-CM | POA: Diagnosis not present

## 2022-12-30 DIAGNOSIS — R52 Pain, unspecified: Secondary | ICD-10-CM | POA: Diagnosis not present

## 2022-12-30 DIAGNOSIS — N186 End stage renal disease: Secondary | ICD-10-CM | POA: Diagnosis not present

## 2022-12-30 DIAGNOSIS — Z992 Dependence on renal dialysis: Secondary | ICD-10-CM | POA: Diagnosis not present

## 2023-01-01 DIAGNOSIS — R52 Pain, unspecified: Secondary | ICD-10-CM | POA: Diagnosis not present

## 2023-01-01 DIAGNOSIS — G8929 Other chronic pain: Secondary | ICD-10-CM | POA: Diagnosis not present

## 2023-01-01 DIAGNOSIS — N186 End stage renal disease: Secondary | ICD-10-CM | POA: Diagnosis not present

## 2023-01-01 DIAGNOSIS — N2581 Secondary hyperparathyroidism of renal origin: Secondary | ICD-10-CM | POA: Diagnosis not present

## 2023-01-01 DIAGNOSIS — R519 Headache, unspecified: Secondary | ICD-10-CM | POA: Diagnosis not present

## 2023-01-01 DIAGNOSIS — Z992 Dependence on renal dialysis: Secondary | ICD-10-CM | POA: Diagnosis not present

## 2023-01-01 DIAGNOSIS — D631 Anemia in chronic kidney disease: Secondary | ICD-10-CM | POA: Diagnosis not present

## 2023-01-04 DIAGNOSIS — R519 Headache, unspecified: Secondary | ICD-10-CM | POA: Diagnosis not present

## 2023-01-04 DIAGNOSIS — N186 End stage renal disease: Secondary | ICD-10-CM | POA: Diagnosis not present

## 2023-01-04 DIAGNOSIS — D631 Anemia in chronic kidney disease: Secondary | ICD-10-CM | POA: Diagnosis not present

## 2023-01-04 DIAGNOSIS — N2581 Secondary hyperparathyroidism of renal origin: Secondary | ICD-10-CM | POA: Diagnosis not present

## 2023-01-04 DIAGNOSIS — R52 Pain, unspecified: Secondary | ICD-10-CM | POA: Diagnosis not present

## 2023-01-04 DIAGNOSIS — G8929 Other chronic pain: Secondary | ICD-10-CM | POA: Diagnosis not present

## 2023-01-04 DIAGNOSIS — Z992 Dependence on renal dialysis: Secondary | ICD-10-CM | POA: Diagnosis not present

## 2023-01-06 DIAGNOSIS — R519 Headache, unspecified: Secondary | ICD-10-CM | POA: Diagnosis not present

## 2023-01-06 DIAGNOSIS — Z992 Dependence on renal dialysis: Secondary | ICD-10-CM | POA: Diagnosis not present

## 2023-01-06 DIAGNOSIS — N2581 Secondary hyperparathyroidism of renal origin: Secondary | ICD-10-CM | POA: Diagnosis not present

## 2023-01-06 DIAGNOSIS — D631 Anemia in chronic kidney disease: Secondary | ICD-10-CM | POA: Diagnosis not present

## 2023-01-06 DIAGNOSIS — G8929 Other chronic pain: Secondary | ICD-10-CM | POA: Diagnosis not present

## 2023-01-06 DIAGNOSIS — N186 End stage renal disease: Secondary | ICD-10-CM | POA: Diagnosis not present

## 2023-01-06 DIAGNOSIS — R52 Pain, unspecified: Secondary | ICD-10-CM | POA: Diagnosis not present

## 2023-01-07 ENCOUNTER — Other Ambulatory Visit: Payer: Self-pay | Admitting: Internal Medicine

## 2023-01-07 ENCOUNTER — Other Ambulatory Visit (HOSPITAL_COMMUNITY): Payer: Self-pay

## 2023-01-07 MED ORDER — CYCLOBENZAPRINE HCL 10 MG PO TABS
10.0000 mg | ORAL_TABLET | Freq: Three times a day (TID) | ORAL | 0 refills | Status: DC | PRN
  Filled 2023-01-07: qty 270, 90d supply, fill #0

## 2023-01-08 DIAGNOSIS — N186 End stage renal disease: Secondary | ICD-10-CM | POA: Diagnosis not present

## 2023-01-08 DIAGNOSIS — Z992 Dependence on renal dialysis: Secondary | ICD-10-CM | POA: Diagnosis not present

## 2023-01-08 DIAGNOSIS — R52 Pain, unspecified: Secondary | ICD-10-CM | POA: Diagnosis not present

## 2023-01-08 DIAGNOSIS — D631 Anemia in chronic kidney disease: Secondary | ICD-10-CM | POA: Diagnosis not present

## 2023-01-08 DIAGNOSIS — R519 Headache, unspecified: Secondary | ICD-10-CM | POA: Diagnosis not present

## 2023-01-08 DIAGNOSIS — G8929 Other chronic pain: Secondary | ICD-10-CM | POA: Diagnosis not present

## 2023-01-08 DIAGNOSIS — N2581 Secondary hyperparathyroidism of renal origin: Secondary | ICD-10-CM | POA: Diagnosis not present

## 2023-01-10 ENCOUNTER — Other Ambulatory Visit (HOSPITAL_COMMUNITY): Payer: Self-pay

## 2023-01-10 DIAGNOSIS — G894 Chronic pain syndrome: Secondary | ICD-10-CM | POA: Diagnosis not present

## 2023-01-10 MED ORDER — OXYCODONE HCL 15 MG PO TABS
15.0000 mg | ORAL_TABLET | Freq: Four times a day (QID) | ORAL | 0 refills | Status: DC | PRN
  Filled 2023-01-12: qty 120, 30d supply, fill #0

## 2023-01-11 ENCOUNTER — Other Ambulatory Visit (HOSPITAL_COMMUNITY): Payer: Self-pay

## 2023-01-11 DIAGNOSIS — N2581 Secondary hyperparathyroidism of renal origin: Secondary | ICD-10-CM | POA: Diagnosis not present

## 2023-01-11 DIAGNOSIS — D631 Anemia in chronic kidney disease: Secondary | ICD-10-CM | POA: Diagnosis not present

## 2023-01-11 DIAGNOSIS — R52 Pain, unspecified: Secondary | ICD-10-CM | POA: Diagnosis not present

## 2023-01-11 DIAGNOSIS — G8929 Other chronic pain: Secondary | ICD-10-CM | POA: Diagnosis not present

## 2023-01-11 DIAGNOSIS — N186 End stage renal disease: Secondary | ICD-10-CM | POA: Diagnosis not present

## 2023-01-11 DIAGNOSIS — R519 Headache, unspecified: Secondary | ICD-10-CM | POA: Diagnosis not present

## 2023-01-11 DIAGNOSIS — Z992 Dependence on renal dialysis: Secondary | ICD-10-CM | POA: Diagnosis not present

## 2023-01-12 ENCOUNTER — Other Ambulatory Visit (HOSPITAL_COMMUNITY): Payer: Self-pay

## 2023-01-12 DIAGNOSIS — Z992 Dependence on renal dialysis: Secondary | ICD-10-CM | POA: Diagnosis not present

## 2023-01-12 DIAGNOSIS — E1022 Type 1 diabetes mellitus with diabetic chronic kidney disease: Secondary | ICD-10-CM | POA: Diagnosis not present

## 2023-01-12 DIAGNOSIS — N186 End stage renal disease: Secondary | ICD-10-CM | POA: Diagnosis not present

## 2023-01-13 DIAGNOSIS — N186 End stage renal disease: Secondary | ICD-10-CM | POA: Diagnosis not present

## 2023-01-13 DIAGNOSIS — Z992 Dependence on renal dialysis: Secondary | ICD-10-CM | POA: Diagnosis not present

## 2023-01-13 DIAGNOSIS — R52 Pain, unspecified: Secondary | ICD-10-CM | POA: Diagnosis not present

## 2023-01-13 DIAGNOSIS — D631 Anemia in chronic kidney disease: Secondary | ICD-10-CM | POA: Diagnosis not present

## 2023-01-13 DIAGNOSIS — N2581 Secondary hyperparathyroidism of renal origin: Secondary | ICD-10-CM | POA: Diagnosis not present

## 2023-01-13 DIAGNOSIS — R519 Headache, unspecified: Secondary | ICD-10-CM | POA: Diagnosis not present

## 2023-01-13 DIAGNOSIS — E1129 Type 2 diabetes mellitus with other diabetic kidney complication: Secondary | ICD-10-CM | POA: Diagnosis not present

## 2023-01-15 DIAGNOSIS — N2581 Secondary hyperparathyroidism of renal origin: Secondary | ICD-10-CM | POA: Diagnosis not present

## 2023-01-15 DIAGNOSIS — Z992 Dependence on renal dialysis: Secondary | ICD-10-CM | POA: Diagnosis not present

## 2023-01-15 DIAGNOSIS — N186 End stage renal disease: Secondary | ICD-10-CM | POA: Diagnosis not present

## 2023-01-15 DIAGNOSIS — E1129 Type 2 diabetes mellitus with other diabetic kidney complication: Secondary | ICD-10-CM | POA: Diagnosis not present

## 2023-01-15 DIAGNOSIS — R52 Pain, unspecified: Secondary | ICD-10-CM | POA: Diagnosis not present

## 2023-01-15 DIAGNOSIS — D631 Anemia in chronic kidney disease: Secondary | ICD-10-CM | POA: Diagnosis not present

## 2023-01-15 DIAGNOSIS — R519 Headache, unspecified: Secondary | ICD-10-CM | POA: Diagnosis not present

## 2023-01-17 ENCOUNTER — Ambulatory Visit
Admission: RE | Admit: 2023-01-17 | Discharge: 2023-01-17 | Disposition: A | Discharge status: Home / Self Care | Payer: Medicare Other | Source: Ambulatory Visit | Attending: Obstetrics and Gynecology | Admitting: Obstetrics and Gynecology

## 2023-01-17 ENCOUNTER — Other Ambulatory Visit: Payer: Self-pay | Admitting: Obstetrics and Gynecology

## 2023-01-17 DIAGNOSIS — N6310 Unspecified lump in the right breast, unspecified quadrant: Secondary | ICD-10-CM

## 2023-01-17 DIAGNOSIS — N6312 Unspecified lump in the right breast, upper inner quadrant: Secondary | ICD-10-CM | POA: Diagnosis not present

## 2023-01-17 DIAGNOSIS — M25561 Pain in right knee: Secondary | ICD-10-CM | POA: Insufficient documentation

## 2023-01-17 DIAGNOSIS — N6311 Unspecified lump in the right breast, upper outer quadrant: Secondary | ICD-10-CM | POA: Diagnosis not present

## 2023-01-17 DIAGNOSIS — M79671 Pain in right foot: Secondary | ICD-10-CM | POA: Diagnosis not present

## 2023-01-17 DIAGNOSIS — R928 Other abnormal and inconclusive findings on diagnostic imaging of breast: Secondary | ICD-10-CM | POA: Diagnosis not present

## 2023-01-17 IMAGING — CT CT ABD-PELV W/ CM
2 of 5 series · 12 of 36 positions shown, 15 images · IV contrast (omnipaque)
Comparison: Multiple exams, including 08/05/2021

CLINICAL DATA: Fall, striking left flank and chest on a dresser.
Bruising.

EXAM:
CT CHEST, ABDOMEN, AND PELVIS WITH CONTRAST
TECHNIQUE: Multidetector CT imaging of the chest, abdomen and pelvis was
performed following the standard protocol during bolus
administration of intravenous contrast.
CONTRAST:  80mL OMNIPAQUE IOHEXOL 350 MG/ML SOLN

[Series 504: cap with · axial · 0.81mm/px · z∈[-800,-320]mm · 9 of 121 slices shown, 12 images]
[im 13/121  mediastinal]
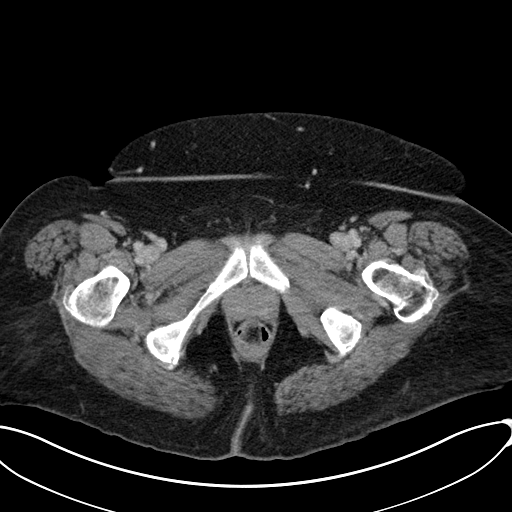
[im 13/121  lung]
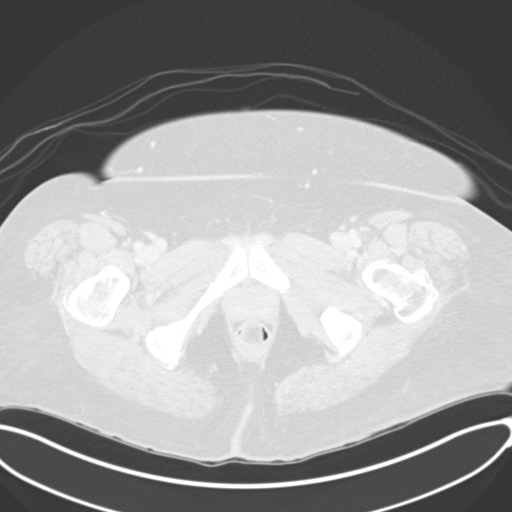
[im 25/121  lung]
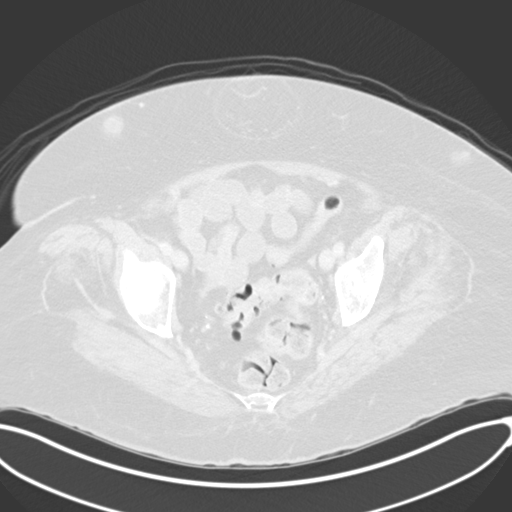
[im 37/121  lung]
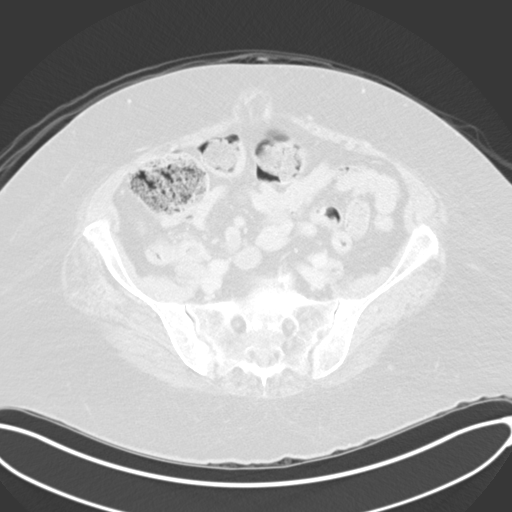
[im 49/121  lung]
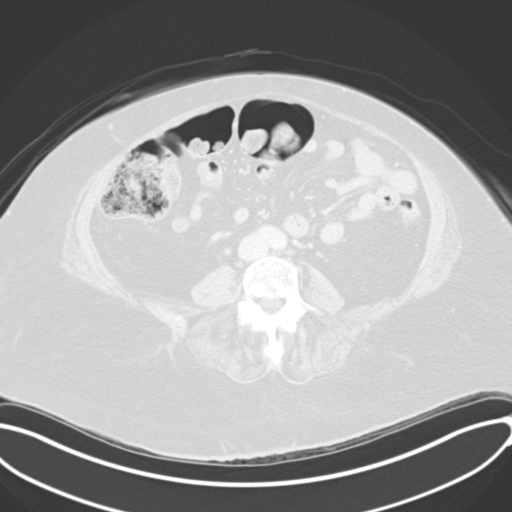
[im 61/121  mediastinal]
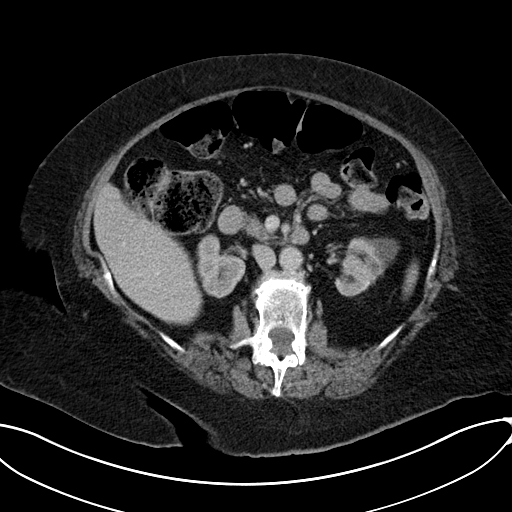
[im 61/121  lung]
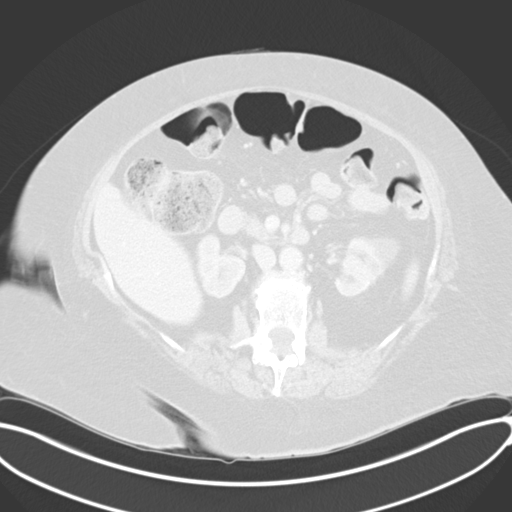
[im 73/121  lung]
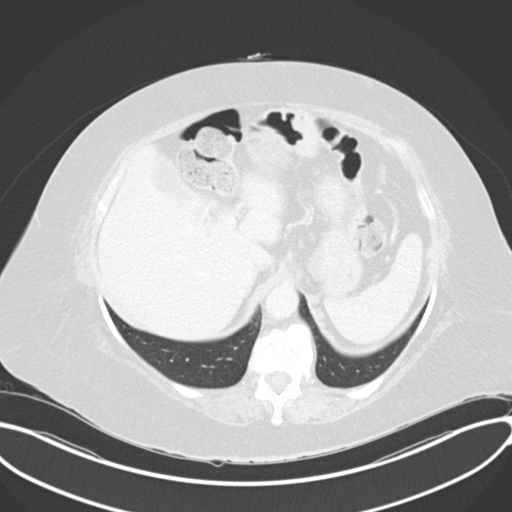
[im 85/121  lung]
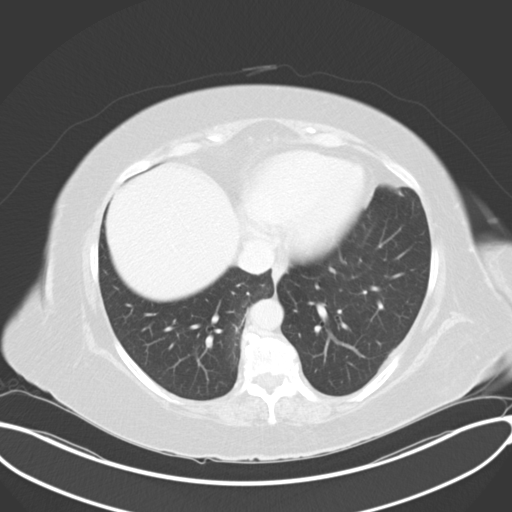
[im 97/121  lung]
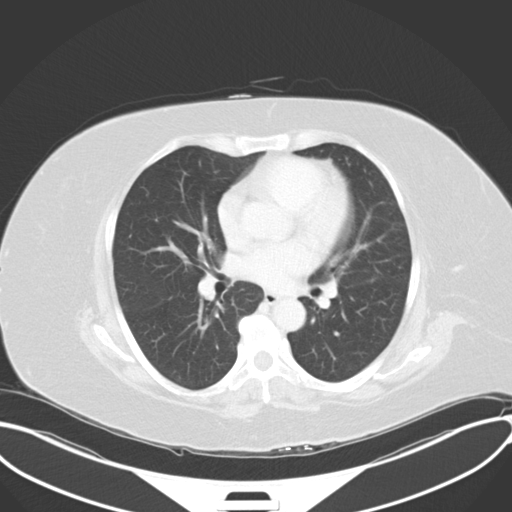
[im 109/121  mediastinal]
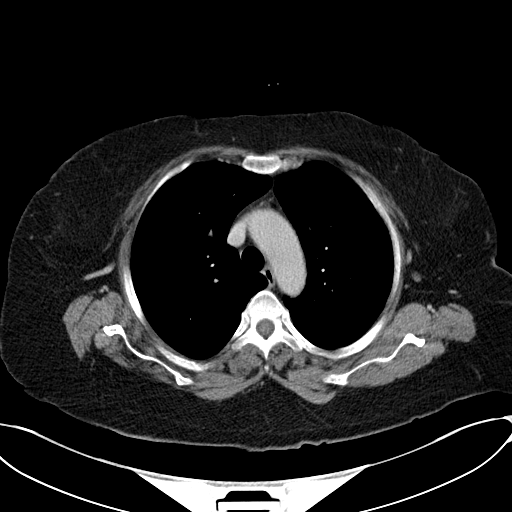
[im 109/121  lung]
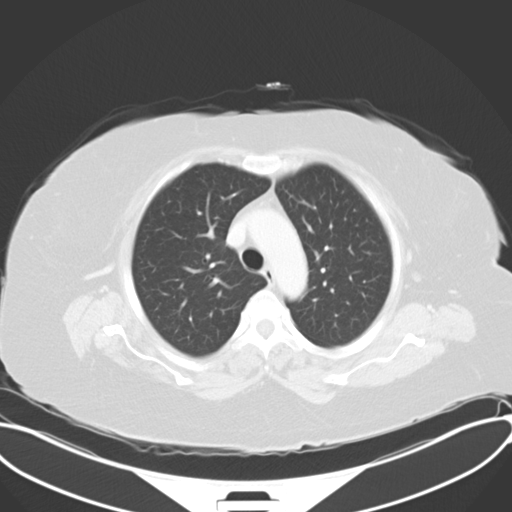

[Series 505: coronals · coronal · 0.90mm/px · 3 of 154 slices shown]
[im 31/154  lung]
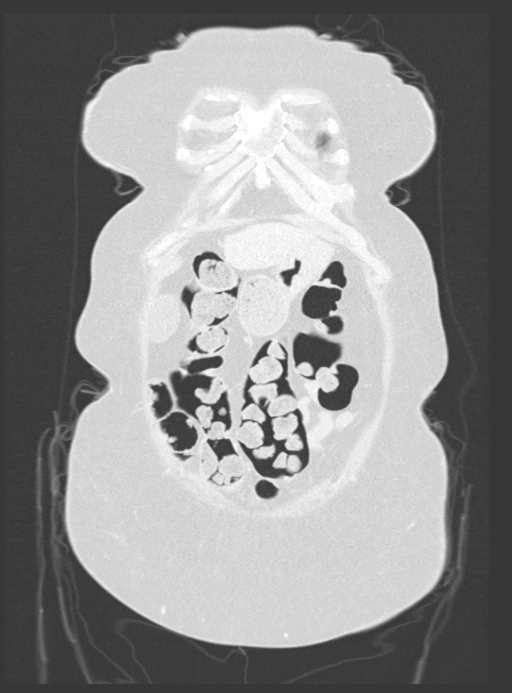
[im 62/154  lung]
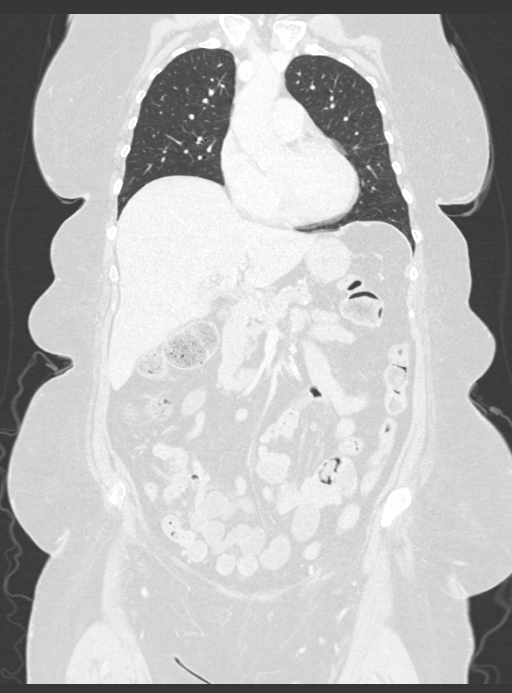
[im 92/154  lung]
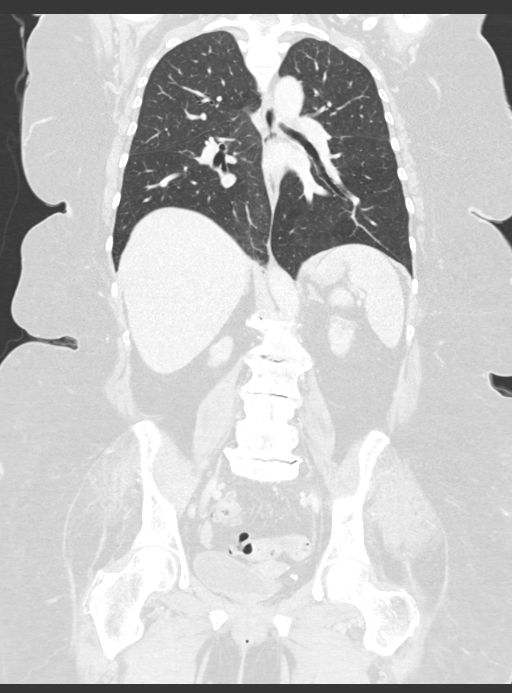

[12 of 36 positions shown; findings below may reference images not displayed]

FINDINGS: CT CHEST FINDINGS

Cardiovascular: Coronary, aortic arch, and branch vessel
atherosclerotic vascular disease.

Mediastinum/Nodes: Unremarkable

Lungs/Pleura: Mild scarring in the lingula.

Musculoskeletal: Substantial degenerative glenohumeral arthropathy
on the right.

2 mm displacement of a acute left posterolateral ninth rib fracture
on image 94 series 508. Lower thoracic spondylosis.

CT ABDOMEN PELVIS FINDINGS

Hepatobiliary: Unremarkable

Pancreas: Unremarkable

Spleen: Unremarkable

Adrenals/Urinary Tract: Similar appearance of hypodense renal
lesions favoring cysts. Bilateral renal atrophy. Adrenal glands
unremarkable. No hydronephrosis, hydroureter, or urinary tract
calculi observed.

Stomach/Bowel: Sigmoid colon diverticulosis without findings of
active diverticulitis. Borderline prominence of stool in the upper
colon. Subtle residual mesenteric edema along right lower quadrant
small bowel loop but without the borderline dilatation shown on the
08/05/2021 exam, significance uncertain. Small adjacent localized
mesenteric lymph nodes. Today's exam is not a CT angiogram but I do
not observe high-grade stenosis of the celiac trunk or SMA.

Vascular/Lymphatic: Atherosclerosis is present, including aortoiliac
atherosclerotic disease.

Reproductive: Calcifications along the uterine fundus probably in a
small fibroid. Adnexa unremarkable.

Other: No supplemental non-categorized findings.

Musculoskeletal: Umbilical hernia containing adipose tissue
measuring 8.8 by 4.9 by 8.0 cm. Mildly exaggerated lumbar lordosis
with lumbar spondylosis and degenerative disc disease. Grade 1
degenerative retrolisthesis at L2-3.
IMPRESSION: 1. 2 mm displacement of an acute left posterolateral ninth rib
fracture. No pneumothorax or pleural effusion.
2. Mildly reduced mesenteric stranding/edema associated with the
distal ileum compared to the 08/05/2021 exam. This remains
nonspecific but could reflect underlying inflammation. There some
small localized adjacent mesenteric lymph nodes.
3. Other imaging findings of potential clinical significance: Aortic
Atherosclerosis ([id number]-376.6). Coronary atherosclerosis. Sigmoid
colon diverticulosis. Borderline appearance for constipation. Small
uterine for calcified fundal fibroid. Moderate-sized umbilical
hernia containing adipose tissue. Lumbar and lower thoracic
spondylosis. Degenerative right glenohumeral arthropathy.

## 2023-01-17 IMAGING — CT CT T SPINE W/O CM
2 of 5 series · 15 of 33 positions shown, 19 images · non-contrast
Comparison: Two-view chest x-ray 03/01/2016

CLINICAL DATA: Fall.  Back pain and bruising.

EXAM:
CT THORACIC SPINE WITHOUT CONTRAST
TECHNIQUE: Multidetector CT images of the thoracic were obtained using the
standard protocol without intravenous contrast.

[Series 2: st axial t spine · axial · 0.35mm/px · z∈[-530,-284]mm · 10 of 147 slices shown, 13 images]
[im 12/147  soft-tissue]
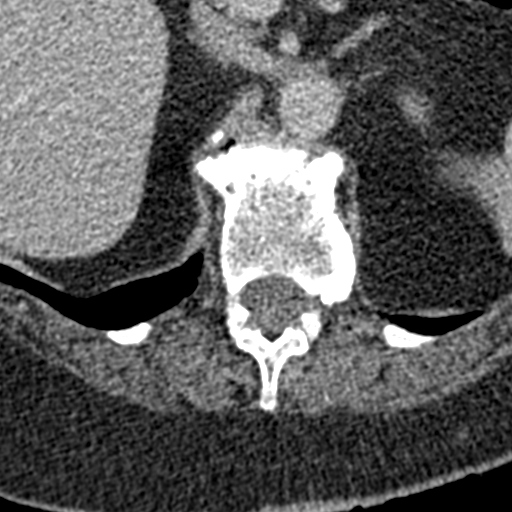
[im 12/147  bone]
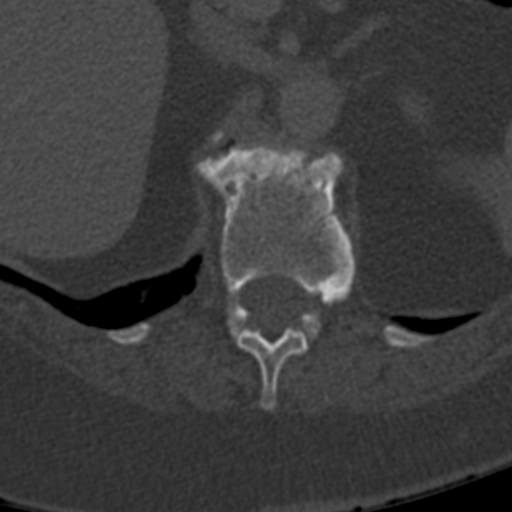
[im 23/147  bone]
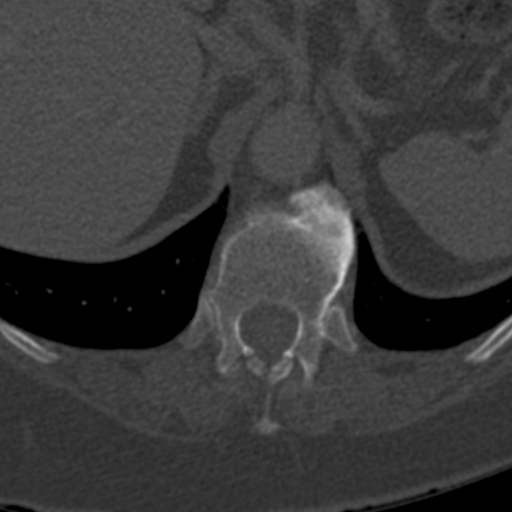
[im 45/147  bone]
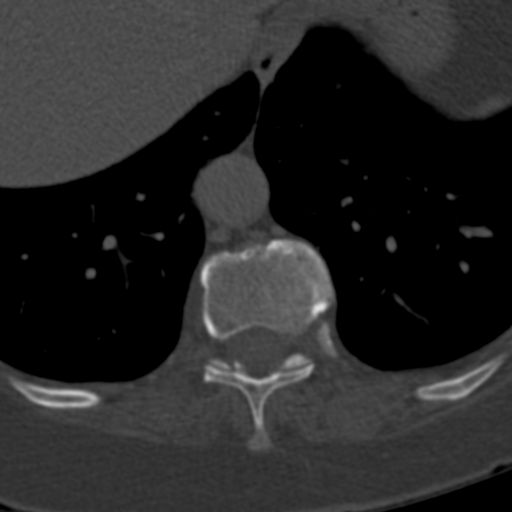
[im 57/147  bone]
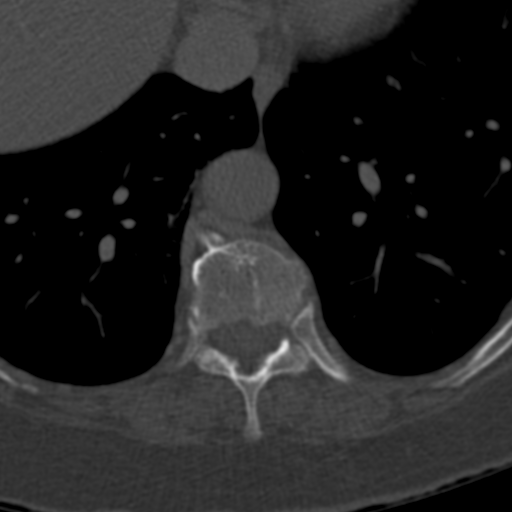
[im 68/147  soft-tissue]
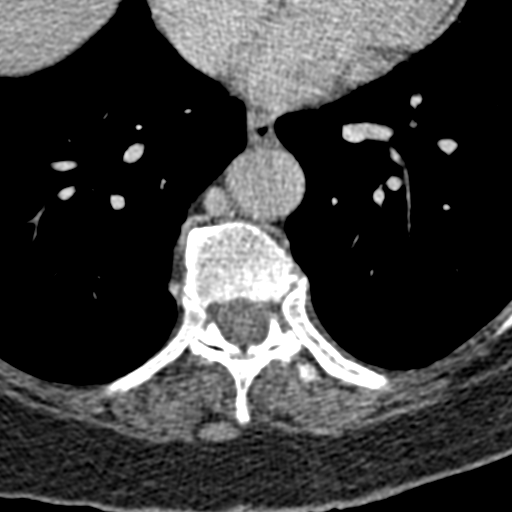
[im 68/147  bone]
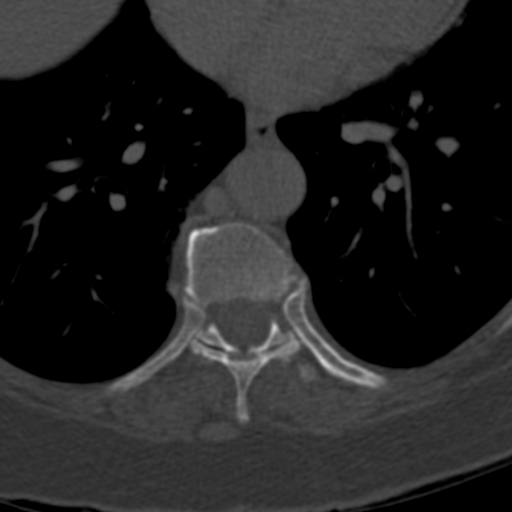
[im 79/147  bone]
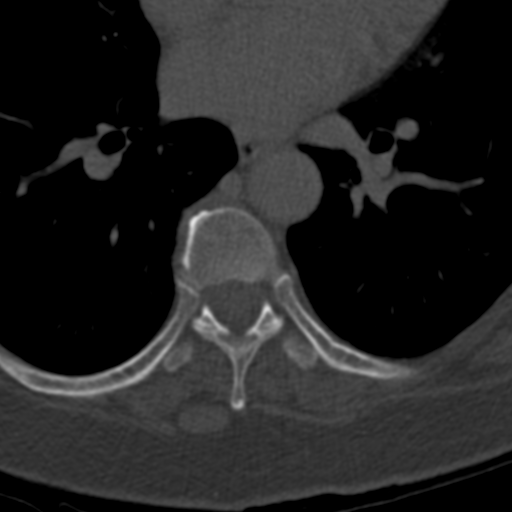
[im 90/147  bone]
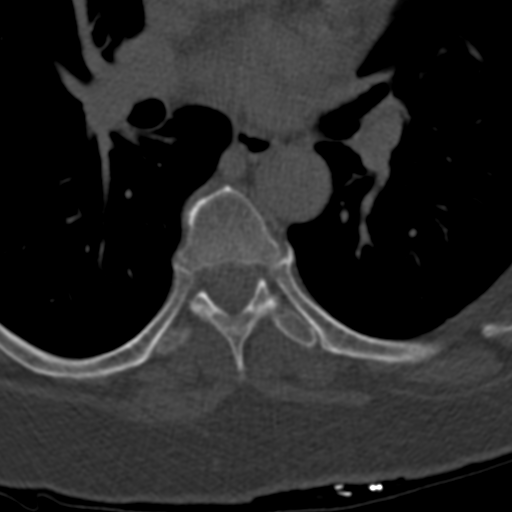
[im 113/147  bone]
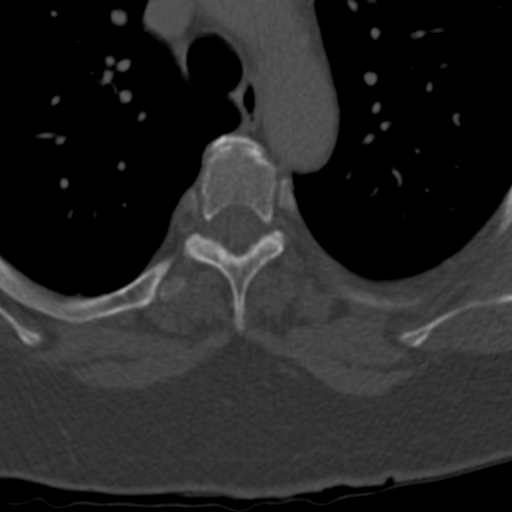
[im 124/147  soft-tissue]
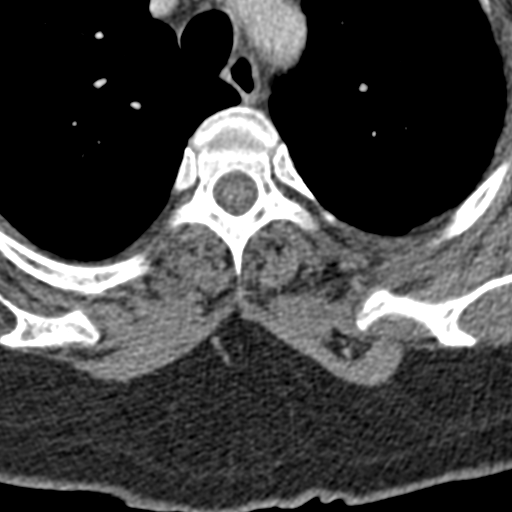
[im 124/147  bone]
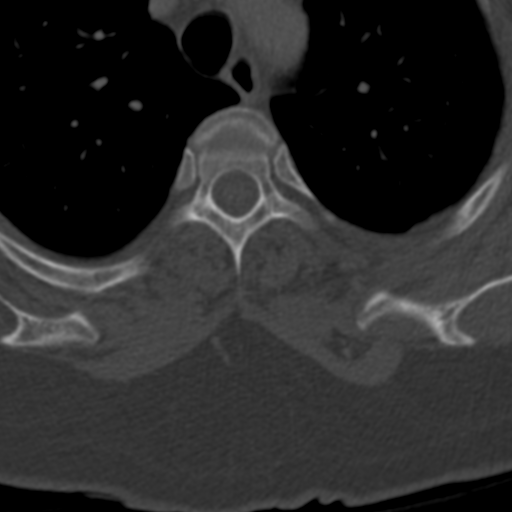
[im 135/147  bone]
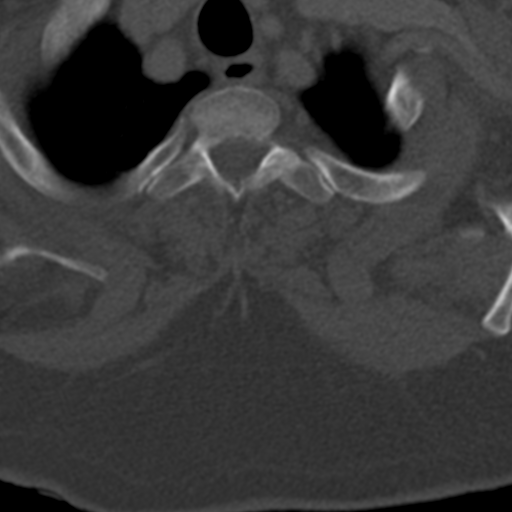

[Series 602: sagittal st t spine · sagittal · 0.57mm/px · 5 of 54 slices shown, 6 images]
[im 18/54  bone]
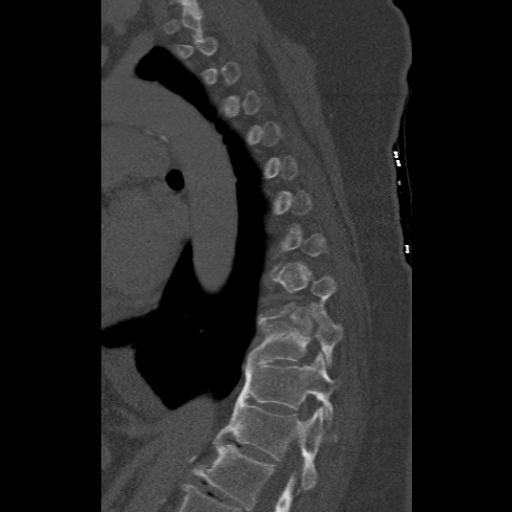
[im 23/54  bone]
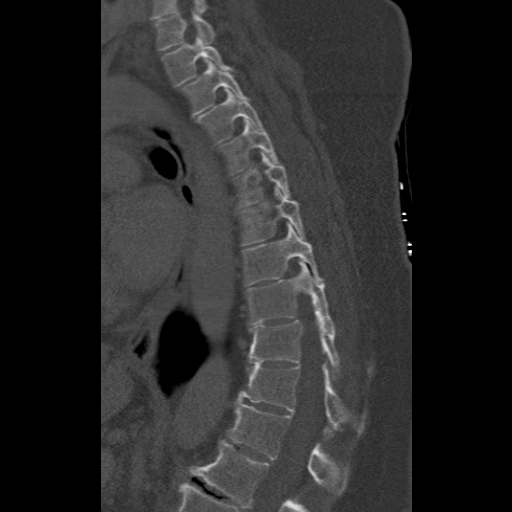
[im 27/54  soft-tissue]
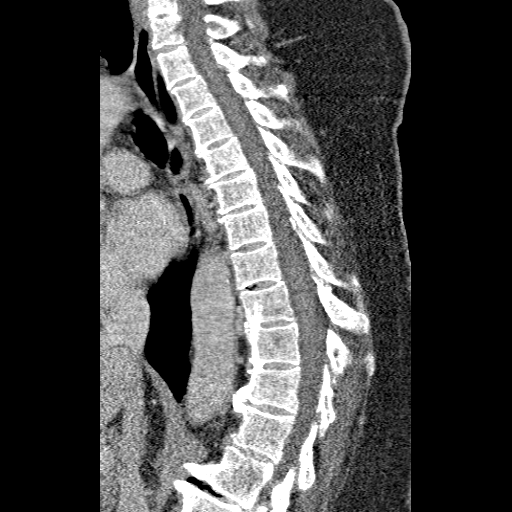
[im 27/54  bone]
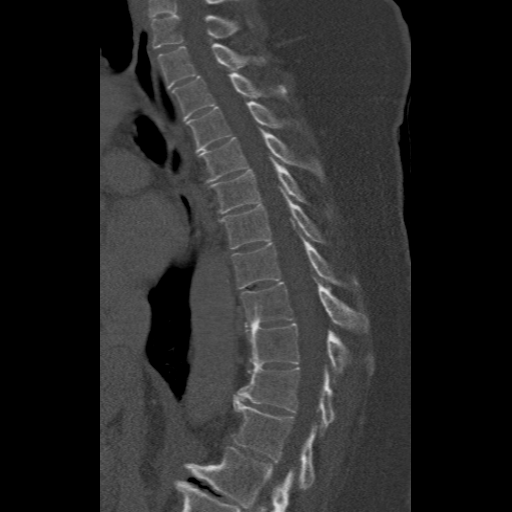
[im 31/54  bone]
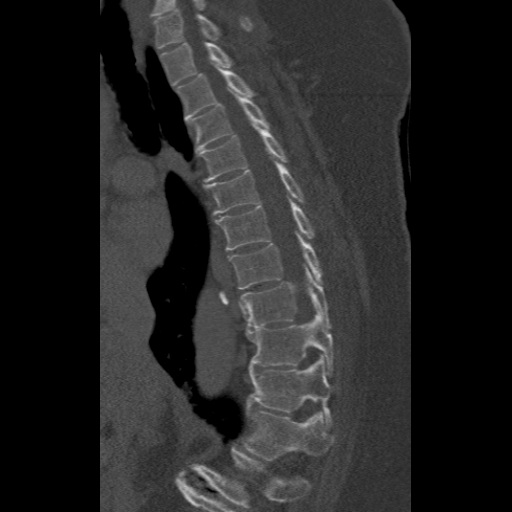
[im 36/54  bone]
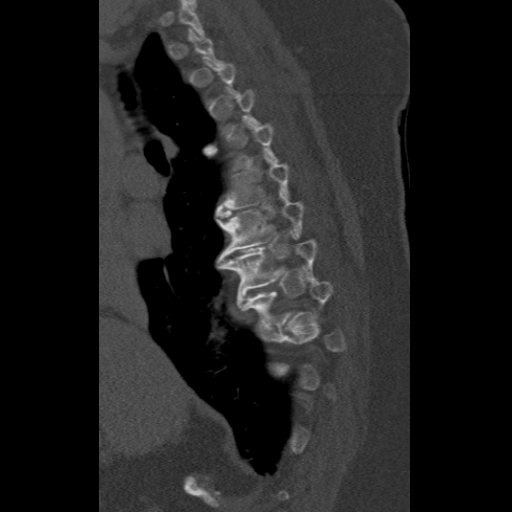

[15 of 33 positions shown; findings below may reference images not displayed]

FINDINGS: Alignment: No significant listhesis is present.

Vertebrae: Vertebral body heights are preserved. Fused anterior
osteophytes are present. No acute healing fractures are present.
Visualized ribs are within normal limits.

Paraspinal and other soft tissues: Paraspinous soft tissues are
within normal limits. Visualized lung fields are clear. No
pneumothorax is present.

Disc levels: Extensive ankylosis is present. Foramina and the
central canal are patent.
IMPRESSION: 1. No acute or healing fractures.
2. Extensive ankylosis.

## 2023-01-18 DIAGNOSIS — D631 Anemia in chronic kidney disease: Secondary | ICD-10-CM | POA: Diagnosis not present

## 2023-01-18 DIAGNOSIS — R519 Headache, unspecified: Secondary | ICD-10-CM | POA: Diagnosis not present

## 2023-01-18 DIAGNOSIS — R52 Pain, unspecified: Secondary | ICD-10-CM | POA: Diagnosis not present

## 2023-01-18 DIAGNOSIS — Z992 Dependence on renal dialysis: Secondary | ICD-10-CM | POA: Diagnosis not present

## 2023-01-18 DIAGNOSIS — E1129 Type 2 diabetes mellitus with other diabetic kidney complication: Secondary | ICD-10-CM | POA: Diagnosis not present

## 2023-01-18 DIAGNOSIS — N186 End stage renal disease: Secondary | ICD-10-CM | POA: Diagnosis not present

## 2023-01-18 DIAGNOSIS — N2581 Secondary hyperparathyroidism of renal origin: Secondary | ICD-10-CM | POA: Diagnosis not present

## 2023-01-19 ENCOUNTER — Telehealth: Payer: Self-pay | Admitting: Licensed Clinical Social Worker

## 2023-01-19 NOTE — Patient Outreach (Signed)
  Care Coordination   01/19/2023 Name: [name] MRN: [id number] DOB: [birth year]   Care Coordination Outreach Attempts:  An unsuccessful telephone outreach was attempted today to offer the patient information about available care coordination services as a benefit of their health plan.   Follow Up Plan:  Additional outreach attempts will be made to offer the patient care coordination information and services.   Encounter Outcome:  No Answer   Care Coordination Interventions:  No, not indicated    Casimer Lanius, [place] [phone]

## 2023-01-20 DIAGNOSIS — Z992 Dependence on renal dialysis: Secondary | ICD-10-CM | POA: Diagnosis not present

## 2023-01-20 DIAGNOSIS — R519 Headache, unspecified: Secondary | ICD-10-CM | POA: Diagnosis not present

## 2023-01-20 DIAGNOSIS — R52 Pain, unspecified: Secondary | ICD-10-CM | POA: Diagnosis not present

## 2023-01-20 DIAGNOSIS — E1129 Type 2 diabetes mellitus with other diabetic kidney complication: Secondary | ICD-10-CM | POA: Diagnosis not present

## 2023-01-20 DIAGNOSIS — N186 End stage renal disease: Secondary | ICD-10-CM | POA: Diagnosis not present

## 2023-01-20 DIAGNOSIS — D631 Anemia in chronic kidney disease: Secondary | ICD-10-CM | POA: Diagnosis not present

## 2023-01-20 DIAGNOSIS — N2581 Secondary hyperparathyroidism of renal origin: Secondary | ICD-10-CM | POA: Diagnosis not present

## 2023-01-22 DIAGNOSIS — N186 End stage renal disease: Secondary | ICD-10-CM | POA: Diagnosis not present

## 2023-01-22 DIAGNOSIS — Z992 Dependence on renal dialysis: Secondary | ICD-10-CM | POA: Diagnosis not present

## 2023-01-22 DIAGNOSIS — R519 Headache, unspecified: Secondary | ICD-10-CM | POA: Diagnosis not present

## 2023-01-22 DIAGNOSIS — R52 Pain, unspecified: Secondary | ICD-10-CM | POA: Diagnosis not present

## 2023-01-22 DIAGNOSIS — D631 Anemia in chronic kidney disease: Secondary | ICD-10-CM | POA: Diagnosis not present

## 2023-01-22 DIAGNOSIS — N2581 Secondary hyperparathyroidism of renal origin: Secondary | ICD-10-CM | POA: Diagnosis not present

## 2023-01-22 DIAGNOSIS — E1129 Type 2 diabetes mellitus with other diabetic kidney complication: Secondary | ICD-10-CM | POA: Diagnosis not present

## 2023-01-24 ENCOUNTER — Ambulatory Visit (INDEPENDENT_AMBULATORY_CARE_PROVIDER_SITE_OTHER): Payer: Medicare Other | Admitting: Emergency Medicine

## 2023-01-24 ENCOUNTER — Other Ambulatory Visit (HOSPITAL_COMMUNITY): Payer: Self-pay

## 2023-01-24 ENCOUNTER — Encounter: Payer: Self-pay | Admitting: Emergency Medicine

## 2023-01-24 VITALS — BP 124/80 | HR 82 | Temp 97.9°F | Ht 62.0 in | Wt 216.5 lb

## 2023-01-24 DIAGNOSIS — J0100 Acute maxillary sinusitis, unspecified: Secondary | ICD-10-CM | POA: Diagnosis not present

## 2023-01-24 MED ORDER — CEFUROXIME AXETIL 250 MG PO TABS
250.0000 mg | ORAL_TABLET | Freq: Two times a day (BID) | ORAL | 0 refills | Status: AC
  Filled 2023-01-24: qty 14, 7d supply, fill #0
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
## 2023-01-24 NOTE — Patient Instructions (Signed)
Sinus Infection, Adult A sinus infection is soreness and swelling (inflammation) of your sinuses. Sinuses are hollow spaces in the bones around your face. They are located: Around your eyes. In the middle of your forehead. Behind your nose. In your cheekbones. Your sinuses and nasal passages are lined with a fluid called mucus. Mucus drains out of your sinuses. Swelling can trap mucus in your sinuses. This lets germs (bacteria, virus, or fungus) grow, which leads to infection. Most of the time, this condition is caused by a virus. What are the causes? Allergies. Asthma. Germs. Things that block your nose or sinuses. Growths in the nose (nasal polyps). Chemicals or irritants in the air. A fungus. This is rare. What increases the risk? Having a weak body defense system (immune system). Doing a lot of swimming or diving. Using nasal sprays too much. Smoking. What are the signs or symptoms? The main symptoms of this condition are pain and a feeling of pressure around the sinuses. Other symptoms include: Stuffy nose (congestion). This may make it hard to breathe through your nose. Runny nose (drainage). Soreness, swelling, and warmth in the sinuses. A cough that may get worse at night. Being unable to smell and taste. Mucus that collects in the throat or the back of the nose (postnasal drip). This may cause a sore throat or bad breath. Being very tired (fatigued). A fever. How is this diagnosed? Your symptoms. Your medical history. A physical exam. Tests to find out if your condition is short-term (acute) or long-term (chronic). Your doctor may: Check your nose for growths (polyps). Check your sinuses using a tool that has a light on one end (endoscope). Check for allergies or germs. Do imaging tests, such as an MRI or CT scan. How is this treated? Treatment for this condition depends on the cause and whether it is short-term or long-term. If caused by a virus, your symptoms  should go away on their own within 10 days. You may be given medicines to relieve symptoms. They include: Medicines that shrink swollen tissue in the nose. A spray that treats swelling of the nostrils. Rinses that help get rid of thick mucus in your nose (nasal saline washes). Medicines that treat allergies (antihistamines). Over-the-counter pain relievers. If caused by bacteria, your doctor may wait to see if you will get better without treatment. You may be given antibiotic medicine if you have: A very bad infection. A weak body defense system. If caused by growths in the nose, surgery may be needed. Follow these instructions at home: Medicines Take, use, or apply over-the-counter and prescription medicines only as told by your doctor. These may include nasal sprays. If you were prescribed an antibiotic medicine, take it as told by your doctor. Do not stop taking it even if you start to feel better. Hydrate and humidify  Drink enough water to keep your pee (urine) pale yellow. Use a cool mist humidifier to keep the humidity level in your home above 50%. Breathe in steam for 10-15 minutes, 3-4 times a day, or as told by your doctor. You can do this in the bathroom while a hot shower is running. Try not to spend time in cool or dry air. Rest Rest as much as you can. Sleep with your head raised (elevated). Make sure you get enough sleep each night. General instructions  Put a warm, moist washcloth on your face 3-4 times a day, or as often as told by your doctor. Use nasal saline washes as often as   told by your doctor. Wash your hands often with soap and water. If you cannot use soap and water, use hand sanitizer. Do not smoke. Avoid being around people who are smoking (secondhand smoke). Keep all follow-up visits. Contact a doctor if: You have a fever. Your symptoms get worse. Your symptoms do not get better within 10 days. Get help right away if: You have a very bad headache. You  cannot stop vomiting. You have very bad pain or swelling around your face or eyes. You have trouble seeing. You feel confused. Your neck is stiff. You have trouble breathing. These symptoms may be an emergency. Get help right away. Call 911. Do not wait to see if the symptoms will go away. Do not drive yourself to the hospital. Summary A sinus infection is swelling of your sinuses. Sinuses are hollow spaces in the bones around your face. This condition is caused by tissues in your nose that become inflamed or swollen. This traps germs. These can lead to infection. If you were prescribed an antibiotic medicine, take it as told by your doctor. Do not stop taking it even if you start to feel better. Keep all follow-up visits. This information is not intended to replace advice given to you by your health care provider. Make sure you discuss any questions you have with your health care provider. Document Revised: 11/03/2021 Document Reviewed: 11/03/2021 Elsevier Patient Education  2023 Elsevier Inc.  

## 2023-01-24 NOTE — Assessment & Plan Note (Signed)
Immunocompromised dialysis patient with bacterial sinus infection. Recommend to start Ceftin 250 mg twice a day for 7 to 10 days. ED precautions given. Advised to rest and stay well-hydrated dialysis permitting. Must follow-up with PCP if no better or worse during the next several days.

## 2023-01-25 ENCOUNTER — Other Ambulatory Visit (HOSPITAL_COMMUNITY): Payer: Self-pay

## 2023-01-25 DIAGNOSIS — E1129 Type 2 diabetes mellitus with other diabetic kidney complication: Secondary | ICD-10-CM | POA: Diagnosis not present

## 2023-01-25 DIAGNOSIS — N186 End stage renal disease: Secondary | ICD-10-CM | POA: Diagnosis not present

## 2023-01-25 DIAGNOSIS — D631 Anemia in chronic kidney disease: Secondary | ICD-10-CM | POA: Diagnosis not present

## 2023-01-25 DIAGNOSIS — R52 Pain, unspecified: Secondary | ICD-10-CM | POA: Diagnosis not present

## 2023-01-25 DIAGNOSIS — N2581 Secondary hyperparathyroidism of renal origin: Secondary | ICD-10-CM | POA: Diagnosis not present

## 2023-01-25 DIAGNOSIS — Z992 Dependence on renal dialysis: Secondary | ICD-10-CM | POA: Diagnosis not present

## 2023-01-25 DIAGNOSIS — R519 Headache, unspecified: Secondary | ICD-10-CM | POA: Diagnosis not present

## 2023-01-26 DIAGNOSIS — S92354A Nondisplaced fracture of fifth metatarsal bone, right foot, initial encounter for closed fracture: Secondary | ICD-10-CM | POA: Diagnosis not present

## 2023-01-27 DIAGNOSIS — D631 Anemia in chronic kidney disease: Secondary | ICD-10-CM | POA: Diagnosis not present

## 2023-01-27 DIAGNOSIS — N186 End stage renal disease: Secondary | ICD-10-CM | POA: Diagnosis not present

## 2023-01-27 DIAGNOSIS — N2581 Secondary hyperparathyroidism of renal origin: Secondary | ICD-10-CM | POA: Diagnosis not present

## 2023-01-27 DIAGNOSIS — E1129 Type 2 diabetes mellitus with other diabetic kidney complication: Secondary | ICD-10-CM | POA: Diagnosis not present

## 2023-01-27 DIAGNOSIS — R52 Pain, unspecified: Secondary | ICD-10-CM | POA: Diagnosis not present

## 2023-01-27 DIAGNOSIS — R519 Headache, unspecified: Secondary | ICD-10-CM | POA: Diagnosis not present

## 2023-01-27 DIAGNOSIS — Z992 Dependence on renal dialysis: Secondary | ICD-10-CM | POA: Diagnosis not present

## 2023-01-29 DIAGNOSIS — D631 Anemia in chronic kidney disease: Secondary | ICD-10-CM | POA: Diagnosis not present

## 2023-01-29 DIAGNOSIS — N2581 Secondary hyperparathyroidism of renal origin: Secondary | ICD-10-CM | POA: Diagnosis not present

## 2023-01-29 DIAGNOSIS — R519 Headache, unspecified: Secondary | ICD-10-CM | POA: Diagnosis not present

## 2023-01-29 DIAGNOSIS — N186 End stage renal disease: Secondary | ICD-10-CM | POA: Diagnosis not present

## 2023-01-29 DIAGNOSIS — R52 Pain, unspecified: Secondary | ICD-10-CM | POA: Diagnosis not present

## 2023-01-29 DIAGNOSIS — Z992 Dependence on renal dialysis: Secondary | ICD-10-CM | POA: Diagnosis not present

## 2023-01-29 DIAGNOSIS — E1129 Type 2 diabetes mellitus with other diabetic kidney complication: Secondary | ICD-10-CM | POA: Diagnosis not present

## 2023-02-01 DIAGNOSIS — R519 Headache, unspecified: Secondary | ICD-10-CM | POA: Diagnosis not present

## 2023-02-01 DIAGNOSIS — E1129 Type 2 diabetes mellitus with other diabetic kidney complication: Secondary | ICD-10-CM | POA: Diagnosis not present

## 2023-02-01 DIAGNOSIS — D631 Anemia in chronic kidney disease: Secondary | ICD-10-CM | POA: Diagnosis not present

## 2023-02-01 DIAGNOSIS — R52 Pain, unspecified: Secondary | ICD-10-CM | POA: Diagnosis not present

## 2023-02-01 DIAGNOSIS — N186 End stage renal disease: Secondary | ICD-10-CM | POA: Diagnosis not present

## 2023-02-01 DIAGNOSIS — Z992 Dependence on renal dialysis: Secondary | ICD-10-CM | POA: Diagnosis not present

## 2023-02-01 DIAGNOSIS — N2581 Secondary hyperparathyroidism of renal origin: Secondary | ICD-10-CM | POA: Diagnosis not present

## 2023-02-03 DIAGNOSIS — Z992 Dependence on renal dialysis: Secondary | ICD-10-CM | POA: Diagnosis not present

## 2023-02-03 DIAGNOSIS — R52 Pain, unspecified: Secondary | ICD-10-CM | POA: Diagnosis not present

## 2023-02-03 DIAGNOSIS — E1129 Type 2 diabetes mellitus with other diabetic kidney complication: Secondary | ICD-10-CM | POA: Diagnosis not present

## 2023-02-03 DIAGNOSIS — R519 Headache, unspecified: Secondary | ICD-10-CM | POA: Diagnosis not present

## 2023-02-03 DIAGNOSIS — D631 Anemia in chronic kidney disease: Secondary | ICD-10-CM | POA: Diagnosis not present

## 2023-02-03 DIAGNOSIS — N2581 Secondary hyperparathyroidism of renal origin: Secondary | ICD-10-CM | POA: Diagnosis not present

## 2023-02-03 DIAGNOSIS — N186 End stage renal disease: Secondary | ICD-10-CM | POA: Diagnosis not present

## 2023-02-05 DIAGNOSIS — Z992 Dependence on renal dialysis: Secondary | ICD-10-CM | POA: Diagnosis not present

## 2023-02-05 DIAGNOSIS — D631 Anemia in chronic kidney disease: Secondary | ICD-10-CM | POA: Diagnosis not present

## 2023-02-05 DIAGNOSIS — R519 Headache, unspecified: Secondary | ICD-10-CM | POA: Diagnosis not present

## 2023-02-05 DIAGNOSIS — N2581 Secondary hyperparathyroidism of renal origin: Secondary | ICD-10-CM | POA: Diagnosis not present

## 2023-02-05 DIAGNOSIS — N186 End stage renal disease: Secondary | ICD-10-CM | POA: Diagnosis not present

## 2023-02-05 DIAGNOSIS — R52 Pain, unspecified: Secondary | ICD-10-CM | POA: Diagnosis not present

## 2023-02-05 DIAGNOSIS — E1129 Type 2 diabetes mellitus with other diabetic kidney complication: Secondary | ICD-10-CM | POA: Diagnosis not present

## 2023-02-07 ENCOUNTER — Telehealth: Payer: Self-pay | Admitting: Internal Medicine

## 2023-02-07 NOTE — Telephone Encounter (Signed)
Patient called states she is now on dialysis and they told her with the medication she takes which has iron she is likely to have liver failure. Michela Pitcher is was too much for her at the time and would like to discuss it further with someone.

## 2023-02-07 NOTE — Telephone Encounter (Signed)
Pt states the iron supplement she had been taking caused issues with her liver. States she was told if she continues to take this it could lead to liver failure. Pt wants to come in for a visit to discuss this with [name]. Pt scheduled to see [name] 02/08/23 at 4pm. Pt aware of appt.

## 2023-02-08 DIAGNOSIS — R519 Headache, unspecified: Secondary | ICD-10-CM | POA: Diagnosis not present

## 2023-02-08 DIAGNOSIS — N186 End stage renal disease: Secondary | ICD-10-CM | POA: Diagnosis not present

## 2023-02-08 DIAGNOSIS — D631 Anemia in chronic kidney disease: Secondary | ICD-10-CM | POA: Diagnosis not present

## 2023-02-08 DIAGNOSIS — Z992 Dependence on renal dialysis: Secondary | ICD-10-CM | POA: Diagnosis not present

## 2023-02-08 DIAGNOSIS — R52 Pain, unspecified: Secondary | ICD-10-CM | POA: Diagnosis not present

## 2023-02-08 DIAGNOSIS — N2581 Secondary hyperparathyroidism of renal origin: Secondary | ICD-10-CM | POA: Diagnosis not present

## 2023-02-08 DIAGNOSIS — E1129 Type 2 diabetes mellitus with other diabetic kidney complication: Secondary | ICD-10-CM | POA: Diagnosis not present

## 2023-02-10 DIAGNOSIS — Z992 Dependence on renal dialysis: Secondary | ICD-10-CM | POA: Diagnosis not present

## 2023-02-10 DIAGNOSIS — E1129 Type 2 diabetes mellitus with other diabetic kidney complication: Secondary | ICD-10-CM | POA: Diagnosis not present

## 2023-02-10 DIAGNOSIS — R52 Pain, unspecified: Secondary | ICD-10-CM | POA: Diagnosis not present

## 2023-02-10 DIAGNOSIS — R519 Headache, unspecified: Secondary | ICD-10-CM | POA: Diagnosis not present

## 2023-02-10 DIAGNOSIS — D631 Anemia in chronic kidney disease: Secondary | ICD-10-CM | POA: Diagnosis not present

## 2023-02-10 DIAGNOSIS — E1022 Type 1 diabetes mellitus with diabetic chronic kidney disease: Secondary | ICD-10-CM | POA: Diagnosis not present

## 2023-02-10 DIAGNOSIS — N2581 Secondary hyperparathyroidism of renal origin: Secondary | ICD-10-CM | POA: Diagnosis not present

## 2023-02-10 DIAGNOSIS — N186 End stage renal disease: Secondary | ICD-10-CM | POA: Diagnosis not present

## 2023-02-12 DIAGNOSIS — Z992 Dependence on renal dialysis: Secondary | ICD-10-CM | POA: Diagnosis not present

## 2023-02-12 DIAGNOSIS — D631 Anemia in chronic kidney disease: Secondary | ICD-10-CM | POA: Diagnosis not present

## 2023-02-12 DIAGNOSIS — R52 Pain, unspecified: Secondary | ICD-10-CM | POA: Diagnosis not present

## 2023-02-12 DIAGNOSIS — N2581 Secondary hyperparathyroidism of renal origin: Secondary | ICD-10-CM | POA: Diagnosis not present

## 2023-02-12 DIAGNOSIS — N186 End stage renal disease: Secondary | ICD-10-CM | POA: Diagnosis not present

## 2023-02-12 DIAGNOSIS — R519 Headache, unspecified: Secondary | ICD-10-CM | POA: Diagnosis not present

## 2023-02-15 ENCOUNTER — Other Ambulatory Visit (HOSPITAL_COMMUNITY): Payer: Self-pay

## 2023-02-15 ENCOUNTER — Other Ambulatory Visit: Payer: Self-pay

## 2023-02-15 DIAGNOSIS — D631 Anemia in chronic kidney disease: Secondary | ICD-10-CM | POA: Diagnosis not present

## 2023-02-15 DIAGNOSIS — Z992 Dependence on renal dialysis: Secondary | ICD-10-CM | POA: Diagnosis not present

## 2023-02-15 DIAGNOSIS — N186 End stage renal disease: Secondary | ICD-10-CM | POA: Diagnosis not present

## 2023-02-15 DIAGNOSIS — N2581 Secondary hyperparathyroidism of renal origin: Secondary | ICD-10-CM | POA: Diagnosis not present

## 2023-02-15 DIAGNOSIS — R52 Pain, unspecified: Secondary | ICD-10-CM | POA: Diagnosis not present

## 2023-02-15 DIAGNOSIS — R519 Headache, unspecified: Secondary | ICD-10-CM | POA: Diagnosis not present

## 2023-02-16 ENCOUNTER — Other Ambulatory Visit (HOSPITAL_COMMUNITY): Payer: Self-pay

## 2023-02-16 MED ORDER — OXYCODONE HCL 15 MG PO TABS
15.0000 mg | ORAL_TABLET | Freq: Four times a day (QID) | ORAL | 0 refills | Status: DC | PRN
  Filled 2023-02-16: qty 120, 30d supply, fill #0

## 2023-02-17 ENCOUNTER — Other Ambulatory Visit (HOSPITAL_COMMUNITY): Payer: Self-pay

## 2023-02-17 DIAGNOSIS — R519 Headache, unspecified: Secondary | ICD-10-CM | POA: Diagnosis not present

## 2023-02-17 DIAGNOSIS — N186 End stage renal disease: Secondary | ICD-10-CM | POA: Diagnosis not present

## 2023-02-17 DIAGNOSIS — D631 Anemia in chronic kidney disease: Secondary | ICD-10-CM | POA: Diagnosis not present

## 2023-02-17 DIAGNOSIS — R52 Pain, unspecified: Secondary | ICD-10-CM | POA: Diagnosis not present

## 2023-02-17 DIAGNOSIS — N2581 Secondary hyperparathyroidism of renal origin: Secondary | ICD-10-CM | POA: Diagnosis not present

## 2023-02-17 DIAGNOSIS — Z992 Dependence on renal dialysis: Secondary | ICD-10-CM | POA: Diagnosis not present

## 2023-02-19 DIAGNOSIS — N2581 Secondary hyperparathyroidism of renal origin: Secondary | ICD-10-CM | POA: Diagnosis not present

## 2023-02-19 DIAGNOSIS — N186 End stage renal disease: Secondary | ICD-10-CM | POA: Diagnosis not present

## 2023-02-19 DIAGNOSIS — R52 Pain, unspecified: Secondary | ICD-10-CM | POA: Diagnosis not present

## 2023-02-19 DIAGNOSIS — R519 Headache, unspecified: Secondary | ICD-10-CM | POA: Diagnosis not present

## 2023-02-19 DIAGNOSIS — D631 Anemia in chronic kidney disease: Secondary | ICD-10-CM | POA: Diagnosis not present

## 2023-02-19 DIAGNOSIS — Z992 Dependence on renal dialysis: Secondary | ICD-10-CM | POA: Diagnosis not present

## 2023-02-22 DIAGNOSIS — R519 Headache, unspecified: Secondary | ICD-10-CM | POA: Diagnosis not present

## 2023-02-22 DIAGNOSIS — N186 End stage renal disease: Secondary | ICD-10-CM | POA: Diagnosis not present

## 2023-02-22 DIAGNOSIS — Z992 Dependence on renal dialysis: Secondary | ICD-10-CM | POA: Diagnosis not present

## 2023-02-22 DIAGNOSIS — R52 Pain, unspecified: Secondary | ICD-10-CM | POA: Diagnosis not present

## 2023-02-22 DIAGNOSIS — D631 Anemia in chronic kidney disease: Secondary | ICD-10-CM | POA: Diagnosis not present

## 2023-02-22 DIAGNOSIS — N2581 Secondary hyperparathyroidism of renal origin: Secondary | ICD-10-CM | POA: Diagnosis not present

## 2023-02-24 DIAGNOSIS — N186 End stage renal disease: Secondary | ICD-10-CM | POA: Diagnosis not present

## 2023-02-24 DIAGNOSIS — R52 Pain, unspecified: Secondary | ICD-10-CM | POA: Diagnosis not present

## 2023-02-24 DIAGNOSIS — R519 Headache, unspecified: Secondary | ICD-10-CM | POA: Diagnosis not present

## 2023-02-24 DIAGNOSIS — N2581 Secondary hyperparathyroidism of renal origin: Secondary | ICD-10-CM | POA: Diagnosis not present

## 2023-02-24 DIAGNOSIS — S92351A Displaced fracture of fifth metatarsal bone, right foot, initial encounter for closed fracture: Secondary | ICD-10-CM | POA: Insufficient documentation

## 2023-02-24 DIAGNOSIS — Z992 Dependence on renal dialysis: Secondary | ICD-10-CM | POA: Diagnosis not present

## 2023-02-24 DIAGNOSIS — D631 Anemia in chronic kidney disease: Secondary | ICD-10-CM | POA: Diagnosis not present

## 2023-02-24 DIAGNOSIS — S92354A Nondisplaced fracture of fifth metatarsal bone, right foot, initial encounter for closed fracture: Secondary | ICD-10-CM | POA: Diagnosis not present

## 2023-02-26 DIAGNOSIS — N186 End stage renal disease: Secondary | ICD-10-CM | POA: Diagnosis not present

## 2023-02-26 DIAGNOSIS — R52 Pain, unspecified: Secondary | ICD-10-CM | POA: Diagnosis not present

## 2023-02-26 DIAGNOSIS — D631 Anemia in chronic kidney disease: Secondary | ICD-10-CM | POA: Diagnosis not present

## 2023-02-26 DIAGNOSIS — N2581 Secondary hyperparathyroidism of renal origin: Secondary | ICD-10-CM | POA: Diagnosis not present

## 2023-02-26 DIAGNOSIS — R519 Headache, unspecified: Secondary | ICD-10-CM | POA: Diagnosis not present

## 2023-02-26 DIAGNOSIS — Z992 Dependence on renal dialysis: Secondary | ICD-10-CM | POA: Diagnosis not present

## 2023-03-01 DIAGNOSIS — Z992 Dependence on renal dialysis: Secondary | ICD-10-CM | POA: Diagnosis not present

## 2023-03-01 DIAGNOSIS — D631 Anemia in chronic kidney disease: Secondary | ICD-10-CM | POA: Diagnosis not present

## 2023-03-01 DIAGNOSIS — N2581 Secondary hyperparathyroidism of renal origin: Secondary | ICD-10-CM | POA: Diagnosis not present

## 2023-03-01 DIAGNOSIS — R52 Pain, unspecified: Secondary | ICD-10-CM | POA: Diagnosis not present

## 2023-03-01 DIAGNOSIS — R519 Headache, unspecified: Secondary | ICD-10-CM | POA: Diagnosis not present

## 2023-03-01 DIAGNOSIS — N186 End stage renal disease: Secondary | ICD-10-CM | POA: Diagnosis not present

## 2023-03-03 DIAGNOSIS — Z992 Dependence on renal dialysis: Secondary | ICD-10-CM | POA: Diagnosis not present

## 2023-03-03 DIAGNOSIS — R52 Pain, unspecified: Secondary | ICD-10-CM | POA: Diagnosis not present

## 2023-03-03 DIAGNOSIS — N186 End stage renal disease: Secondary | ICD-10-CM | POA: Diagnosis not present

## 2023-03-03 DIAGNOSIS — R519 Headache, unspecified: Secondary | ICD-10-CM | POA: Diagnosis not present

## 2023-03-03 DIAGNOSIS — N2581 Secondary hyperparathyroidism of renal origin: Secondary | ICD-10-CM | POA: Diagnosis not present

## 2023-03-03 DIAGNOSIS — D631 Anemia in chronic kidney disease: Secondary | ICD-10-CM | POA: Diagnosis not present

## 2023-03-05 DIAGNOSIS — N186 End stage renal disease: Secondary | ICD-10-CM | POA: Diagnosis not present

## 2023-03-05 DIAGNOSIS — R52 Pain, unspecified: Secondary | ICD-10-CM | POA: Diagnosis not present

## 2023-03-05 DIAGNOSIS — R519 Headache, unspecified: Secondary | ICD-10-CM | POA: Diagnosis not present

## 2023-03-05 DIAGNOSIS — N2581 Secondary hyperparathyroidism of renal origin: Secondary | ICD-10-CM | POA: Diagnosis not present

## 2023-03-05 DIAGNOSIS — D631 Anemia in chronic kidney disease: Secondary | ICD-10-CM | POA: Diagnosis not present

## 2023-03-05 DIAGNOSIS — Z992 Dependence on renal dialysis: Secondary | ICD-10-CM | POA: Diagnosis not present

## 2023-03-08 DIAGNOSIS — R52 Pain, unspecified: Secondary | ICD-10-CM | POA: Diagnosis not present

## 2023-03-08 DIAGNOSIS — Z992 Dependence on renal dialysis: Secondary | ICD-10-CM | POA: Diagnosis not present

## 2023-03-08 DIAGNOSIS — N2581 Secondary hyperparathyroidism of renal origin: Secondary | ICD-10-CM | POA: Diagnosis not present

## 2023-03-08 DIAGNOSIS — N186 End stage renal disease: Secondary | ICD-10-CM | POA: Diagnosis not present

## 2023-03-08 DIAGNOSIS — D631 Anemia in chronic kidney disease: Secondary | ICD-10-CM | POA: Diagnosis not present

## 2023-03-08 DIAGNOSIS — R519 Headache, unspecified: Secondary | ICD-10-CM | POA: Diagnosis not present

## 2023-03-09 ENCOUNTER — Ambulatory Visit: Payer: Medicare Other | Admitting: Internal Medicine

## 2023-03-09 ENCOUNTER — Encounter: Payer: Self-pay | Admitting: Internal Medicine

## 2023-03-09 ENCOUNTER — Other Ambulatory Visit (INDEPENDENT_AMBULATORY_CARE_PROVIDER_SITE_OTHER): Payer: Medicare Other

## 2023-03-09 VITALS — BP 138/82 | HR 87 | Ht 62.0 in | Wt 215.0 lb

## 2023-03-09 DIAGNOSIS — R7989 Other specified abnormal findings of blood chemistry: Secondary | ICD-10-CM

## 2023-03-09 LAB — PROTIME-INR
INR: 0.9 ratio (ref 0.8–1.0)
Prothrombin Time: 10.3 s (ref 9.6–13.1)

## 2023-03-09 LAB — CBC WITH DIFFERENTIAL/PLATELET
Basophils Absolute: 0.1 10*3/uL (ref 0.0–0.1)
Basophils Relative: 1 % (ref 0.0–3.0)
Eosinophils Absolute: 0.5 10*3/uL (ref 0.0–0.7)
Eosinophils Relative: 3.8 % (ref 0.0–5.0)
HCT: 34.3 % — ABNORMAL LOW (ref 36.0–46.0)
Hemoglobin: 11.2 g/dL — ABNORMAL LOW (ref 12.0–15.0)
Lymphocytes Relative: 23.5 % (ref 12.0–46.0)
Lymphs Abs: 2.9 10*3/uL (ref 0.7–4.0)
MCHC: 32.6 g/dL (ref 30.0–36.0)
MCV: 100.4 fl — ABNORMAL HIGH (ref 78.0–100.0)
Monocytes Absolute: 0.8 10*3/uL (ref 0.1–1.0)
Monocytes Relative: 6.7 % (ref 3.0–12.0)
Neutro Abs: 8 10*3/uL — ABNORMAL HIGH (ref 1.4–7.7)
Neutrophils Relative %: 65 % (ref 43.0–77.0)
Platelets: 271 10*3/uL (ref 150.0–400.0)
RBC: 3.42 Mil/uL — ABNORMAL LOW (ref 3.87–5.11)
RDW: 13.8 % (ref 11.5–15.5)
WBC: 12.3 10*3/uL — ABNORMAL HIGH (ref 4.0–10.5)

## 2023-03-09 NOTE — Progress Notes (Signed)
HISTORY OF PRESENT ILLNESS:  [name] is a 73 y.o. female, retired endoscopy nurse, with multiple medical problems including, but not limited to, diabetes mellitus, hypertension, end-stage renal disease on hemodialysis, osteoarthritis, and obesity.  She has been followed in this office for GERD, history of ischemic colitis, and colon cancer screening/surveillance.  She is self-referred today regarding abnormal laboratories.  Specifically, elevated ferritin level noted at dialysis.  I last saw the patient in this office May 2023 after she was hospitalized with right-sided ischemic colitis.  See that dictation for details.  [name] reports to me that she has had elevated phosphorus levels for which she has been on ferric citrate 3 times daily.  She presents with her blood work from January 28, 2023.  She was noted to have a ferritin level of 1875, iron level 184, and transferrin saturation of 86%.  Her hemoglobin was 10.9 with MCV 105.  Normal platelet count of 209.  Elevated phosphorus of 7.1.  Creatinine 5.4.  Her ferric citrate was stopped.  Not clear if she has received erythropoietin  Repeat laboratories February 25, 2023 revealed a ferritin level of 1392, iron level 56, transferrin saturation 25%.  Hemoglobin 10.7 with MCV 106 and platelets 238,000.  Creatinine 5.4 and phosphorus 8.1.  Last prothrombin time March 2023 was normal at 13.9 with INR 1.1. Iron panel from 2017 showed low iron saturation at 13.7% Previous hepatitis B studies were negative.  She denies a personal or family history of liver disease.  No significant history of alcohol consumption.  She has been chronically obese.  A CT scan of the abdomen and pelvis with contrast March 09, 2022 revealed no abnormality of the liver.  The spleen was normal size.  Abdominal ultrasound August 2022 found no abnormalities of the liver.  Liver test from March 2023 were normal   In effort to address her hyperphosphatemia she had been on Velphoro  for short period time, but this was cost prohibitive.  She is anticipating initiating lanthanum therapy  REVIEW OF SYSTEMS:  All non-GI ROS negative as otherwise stated in the HPI except for arthritis  Past Medical History:  Diagnosis Date   Anemia    Anxiety    Brain tumor (benign) ([name])    Colitis 2010   microscopic- Anita [name]   Depression    Diabetes mellitus    type II   Dyspnea    with exertion   ESRD (end stage renal disease) ([place])    TTUSAT [name]    Fibromyalgia    GERD (gastroesophageal reflux disease)    Headache    History of blood transfusion    after knee surgery   Hypertension    discontinued all diuretics and antihypertensives   IBS (irritable bowel syndrome)    LBP (low back pain)    Neuropathy    feet bilat    Osteoarthritis    Osteopenia    Pneumonia    hx of 2014    Rotator cuff tear, right    Sinusitis    currently being treated with antibiotic will complete 03/04/2015    Past Surgical History:  Procedure Laterality Date   AV FISTULA PLACEMENT Right 09/12/2020   Procedure: RIGHT ARTERIOVENOUS (AV) FISTULA CREATION;  Surgeon: Anita Dutch, MD;  Location: [facility];  Service: Vascular;  Laterality: Right;   [place] Right 10/27/2020   Procedure: RIGHT UPPER EXTREMITY SECOND STAGE [place];  Surgeon: Anita Dutch, MD;  Location: [place];  Service: Vascular;  Laterality: Right;   BIOPSY  03/11/2022   Procedure: BIOPSY;  Surgeon: Anita Landmark Telford Nab., MD;  Location: [place] [facility];  Service: Gastroenterology;;   COLONOSCOPY WITH PROPOFOL N/A 03/11/2022   Procedure: COLONOSCOPY WITH PROPOFOL;  Surgeon: Anita Copas., MD;  Location: [facility];  Service: Gastroenterology;  Laterality: N/A;   foramen magnum ependymoma surgery  2003   Anita [name]   JOINT REPLACEMENT Bilateral    NASAL SINUS SURGERY     1973    REVERSE SHOULDER ARTHROPLASTY Right 06/03/2022   Procedure: REVERSE SHOULDER  ARTHROPLASTY;  Surgeon: Anita Britain, MD;  Location: [facility];  Service: Orthopedics;  Laterality: Right;  155min   TONSILLECTOMY     TOTAL KNEE ARTHROPLASTY     L 2008, R 2009, R 2016- [name]   TOTAL KNEE REVISION Right 03/05/2015   Procedure: RIGHT TOTAL KNEE ARTHROPLASTY REVISION;  Surgeon: Anita Arabian, MD;  Location: [facility];  Service: Orthopedics;  Laterality: Right;    Social History [name]  reports that she quit smoking about 34 years ago. Her smoking use included cigarettes. She has a 20.00 pack-year smoking history. She has never used smokeless tobacco. She reports that she does not currently use alcohol. She reports that she does not use drugs.  family history includes Cancer in her brother; Crohn's disease in her maternal uncle; Depression in her mother; Diabetes in her brother, brother, father, mother, and another family member; Heart attack in her brother; Heart disease in her brother and brother; Hyperlipidemia in her brother, brother, and father; Hypertension in her brother, brother, father, and mother; Stroke in her brother; Stroke (age of onset: 29) in her mother.  Allergies  Allergen Reactions   Erythromycin Diarrhea and Nausea And Vomiting   Gabapentin Other (See Comments)    Confusion and falling  Hallucinations       PHYSICAL EXAMINATION: Vital signs: BP 138/82   Pulse 87   Ht 5\' 2"  (1.575 m)   Wt 215 lb (97.5 kg)   BMI 39.32 kg/m   Constitutional: Pleasant, somewhat chronically ill-appearing, overweight, no acute distress Psychiatric: alert and oriented x3, cooperative Eyes: extraocular movements intact, anicteric, conjunctiva pink Mouth: oral pharynx moist, no lesions Neck: supple no lymphadenopathy Cardiovascular: heart regular rate and rhythm, no murmur Lungs: clear to auscultation bilaterally Abdomen: soft, nontender, nondistended, no obvious ascites, no peritoneal signs, normal bowel sounds, no organomegaly Rectal: Omitted Extremities: no  clubbing or cyanosis.  Trace lower extremity edema bilaterally Skin: no lesions on visible extremities Neuro: No focal deficits. No asterixis.  ASSESSMENT:  1.  Elevated ferritin.  Multiple potential etiologies.  Rule out liver disease.  Rule out hereditary hemochromatosis.  No evidence for liver disease by history, physical exam, or ancillary data such as imaging and laboratories.  Suspect drug related 2.  Multiple significant medical problems including end-stage renal disease on hemodialysis 3.  GERD 4.  History ischemic colitis 5.  History of microscopic colitis  PLAN:  1.  CBC, comprehensive metabolic panel, PT/INR 2.  Repeat iron studies 3.  Chronic hepatitis serologies 4.  Hemochromatosis genetics 5.  Abdominal ultrasound with attention to the liver and spleen 6.  Office follow-up in 4 weeks to review the above A total time of 45 minutes was spent preparing to see the patient, reviewing the myriad of data, obtaining comprehensive history, performing medically appropriate physical examination, counseling and educating the patient regarding the above listed issues, ordering multiple laboratories and serologies, ordering genetic studies, ordering radiology  study, arranging follow-up, and documenting clinical information in the health record

## 2023-03-09 NOTE — Patient Instructions (Addendum)
We will call you to schedule your CT scan   We have sacheduled you a follow up on 04/28/2023 at 11:40am  Your provider has requested that you go to the basement level for lab work before leaving today. Press "B" on the elevator. The lab is located at the first door on the left as you exit the elevator.   _______________________________________________________  If your blood pressure at your visit was 140/90 or greater, please contact your primary care physician to follow up on this.  _______________________________________________________  If you are age 21 or older, your body mass index should be between 23-30. Your Body mass index is 39.32 kg/m. If this is out of the aforementioned range listed, please consider follow up with your Primary Care Provider.  If you are age 30 or younger, your body mass index should be between 19-25. Your Body mass index is 39.32 kg/m. If this is out of the aformentioned range listed, please consider follow up with your Primary Care Provider.   ________________________________________________________  The [place] [facility] providers would like to encourage you to use [facility] to communicate with providers for non-urgent requests or questions.  Due to long hold times on the telephone, sending your provider a message by [facility] may be a faster and more efficient way to get a response.  Please allow 48 business hours for a response.  Please remember that this is for non-urgent requests.  _______________________________________________________ It was a pleasure to see you today!  Thank you for trusting me with your gastrointestinal care!

## 2023-03-10 ENCOUNTER — Telehealth: Payer: Self-pay

## 2023-03-10 ENCOUNTER — Other Ambulatory Visit: Payer: Self-pay

## 2023-03-10 DIAGNOSIS — R52 Pain, unspecified: Secondary | ICD-10-CM | POA: Diagnosis not present

## 2023-03-10 DIAGNOSIS — N2581 Secondary hyperparathyroidism of renal origin: Secondary | ICD-10-CM | POA: Diagnosis not present

## 2023-03-10 DIAGNOSIS — Z992 Dependence on renal dialysis: Secondary | ICD-10-CM | POA: Diagnosis not present

## 2023-03-10 DIAGNOSIS — N186 End stage renal disease: Secondary | ICD-10-CM | POA: Diagnosis not present

## 2023-03-10 DIAGNOSIS — R7989 Other specified abnormal findings of blood chemistry: Secondary | ICD-10-CM

## 2023-03-10 DIAGNOSIS — R519 Headache, unspecified: Secondary | ICD-10-CM | POA: Diagnosis not present

## 2023-03-10 DIAGNOSIS — R1010 Upper abdominal pain, unspecified: Secondary | ICD-10-CM

## 2023-03-10 DIAGNOSIS — R1084 Generalized abdominal pain: Secondary | ICD-10-CM

## 2023-03-10 DIAGNOSIS — D631 Anemia in chronic kidney disease: Secondary | ICD-10-CM | POA: Diagnosis not present

## 2023-03-10 LAB — COMPREHENSIVE METABOLIC PANEL
ALT: 13 U/L (ref 0–35)
AST: 17 U/L (ref 0–37)
Albumin: 4.5 g/dL (ref 3.5–5.2)
Alkaline Phosphatase: 117 U/L (ref 39–117)
BUN: 44 mg/dL — ABNORMAL HIGH (ref 6–23)
CO2: 27 mEq/L (ref 19–32)
Calcium: 10.5 mg/dL (ref 8.4–10.5)
Chloride: 89 mEq/L — ABNORMAL LOW (ref 96–112)
Creatinine, Ser: 5.05 mg/dL (ref 0.40–1.20)
GFR: 8.14 mL/min — CL (ref >60.00)
Glucose, Bld: 192 mg/dL — ABNORMAL HIGH (ref 70–99)
Potassium: 5.1 mEq/L (ref 3.5–5.1)
Sodium: 135 mEq/L (ref 135–145)
Total Bilirubin: 0.3 mg/dL (ref 0.2–1.2)
Total Protein: 7.9 g/dL (ref 6.0–8.3)

## 2023-03-10 LAB — IBC + FERRITIN
Ferritin: >1500 ng/mL — ABNORMAL HIGH (ref 10.0–291.0)
Iron: 70 ug/dL (ref 42–145)
Saturation Ratios: 26.2 % (ref 20.0–50.0)
TIBC: 267.4 ug/dL (ref 250.0–450.0)
Transferrin: 191 mg/dL — ABNORMAL LOW (ref 212.0–360.0)

## 2023-03-10 LAB — IRON: Iron: 70 ug/dL (ref 42–145)

## 2023-03-10 NOTE — Patient Instructions (Signed)
Visit Information  Thank you for taking time to visit with me today. Please don't hesitate to contact me if I can be of assistance to you.   Following are the goals we discussed today:   Goals Addressed             This Visit's Progress    Care Corodination Activities       Interventions Today    Flowsheet Row Most Recent Value  Chronic Disease   Chronic disease during today's visit Chronic Kidney Disease/End Stage Renal Disease (ESRD)  General Interventions   General Interventions Discussed/Reviewed General Interventions Discussed, Doctor Visits  Doctor Visits Discussed/Reviewed Doctor Visits Discussed, PCP, Specialist  PCP/Specialist Visits Compliance with follow-up visit  Education Interventions   Education Provided Provided Education  Provided Verbal Education On Other  [discussed care coordination program, encouraged to follow up with providers as recommended. encouraged to have questions written down to discuss with provider at visits]  [place] Discussed/Reviewed Anxiety  [followed by [facility]-RNCM encouraged to continue to follow up at recommended and as needed.]  Safety Interventions   Safety Discussed/Reviewed Fall Risk  [per patient, h/o neuropathy. pt reports fell 01/27/23. Physical therapy discussed-pt not interested. RNCM encouraged patient to change position slowly, use assistive device as needed.]            Our next appointment is by telephone on 03/23/23 at 11:30 am  Please call the care guide team at [phone] if you need to cancel or reschedule your appointment.   If you are experiencing a Mental Health or [place] or need someone to talk to, please call the Suicide and Crisis Lifeline: [place], RN, MSN, BSN, [place] [phone]

## 2023-03-10 NOTE — Telephone Encounter (Signed)
Thanks.  She is a dialysis patient.

## 2023-03-10 NOTE — Telephone Encounter (Signed)
FYI, lab called to report a critical on patient  Creatinine:  5.05 GFR:  8.14

## 2023-03-10 NOTE — Telephone Encounter (Signed)
[name] called patient and communicated her U/S appointment information.  Patient agreed.

## 2023-03-10 NOTE — Patient Outreach (Signed)
  Care Coordination   Initial Visit Note   03/10/2023 Name: [name] MRN: [id number] DOB: 1951-05-05  [name] is a 72 y.o. year old female who sees Plotnikov, Evie Lacks, MD for primary care. I spoke with  [name] by phone today.  What matters to the patients health and wellness today?  Ms. Peace states reports HD patient Tues,Thurs and Sat. She states recently she was informed that she is in liver failure also. Expresses some anxiety associated with disease processes and with provider communication.  Goals Addressed             This Visit's Progress    Care Corodination Activities       Interventions Today    Flowsheet Row Most Recent Value  Chronic Disease   Chronic disease during today's visit Chronic Kidney Disease/End Stage Renal Disease (ESRD)  General Interventions   General Interventions Discussed/Reviewed General Interventions Discussed, Doctor Visits  Doctor Visits Discussed/Reviewed Doctor Visits Discussed, PCP, Specialist  PCP/Specialist Visits Compliance with follow-up visit  Education Interventions   Education Provided Provided Education  Provided Verbal Education On Other  [discussed care coordination program, encouraged to follow up with providers as recommended. encouraged to have questions written down to discuss with provider at visits]  [place] Discussed/Reviewed Anxiety  [followed by [facility]-RNCM encouraged to continue to follow up at recommended and as needed.]  Safety Interventions   Safety Discussed/Reviewed Fall Risk  [per patient, h/o neuropathy. pt reports fell 01/27/23. Physical therapy discussed-pt not interested. RNCM encouraged patient to change position slowly, use assistive device as needed.]            SDOH assessments and interventions completed:  Yes  SDOH Interventions Today    Flowsheet Row Most Recent Value  SDOH Interventions   Food Insecurity Interventions  Intervention Not Indicated  Housing Interventions Intervention Not Indicated  Transportation Interventions Intervention Not Indicated  Utilities Interventions Intervention Not Indicated     Care Coordination Interventions:  Yes, provided   Follow up plan: Follow up call scheduled for 03/23/23    Encounter Outcome:  Pt. Visit Completed   Thea Silversmith, RN, MSN, BSN, [place] Coordinator [phone]

## 2023-03-10 NOTE — Telephone Encounter (Signed)
Scheduled abdominal u/s for 03/17/2023 at 11:30am

## 2023-03-12 DIAGNOSIS — R519 Headache, unspecified: Secondary | ICD-10-CM | POA: Diagnosis not present

## 2023-03-12 DIAGNOSIS — Z992 Dependence on renal dialysis: Secondary | ICD-10-CM | POA: Diagnosis not present

## 2023-03-12 DIAGNOSIS — N2581 Secondary hyperparathyroidism of renal origin: Secondary | ICD-10-CM | POA: Diagnosis not present

## 2023-03-12 DIAGNOSIS — D631 Anemia in chronic kidney disease: Secondary | ICD-10-CM | POA: Diagnosis not present

## 2023-03-12 DIAGNOSIS — N186 End stage renal disease: Secondary | ICD-10-CM | POA: Diagnosis not present

## 2023-03-12 DIAGNOSIS — R52 Pain, unspecified: Secondary | ICD-10-CM | POA: Diagnosis not present

## 2023-03-13 DIAGNOSIS — Z992 Dependence on renal dialysis: Secondary | ICD-10-CM | POA: Diagnosis not present

## 2023-03-13 DIAGNOSIS — N186 End stage renal disease: Secondary | ICD-10-CM | POA: Diagnosis not present

## 2023-03-13 DIAGNOSIS — E1022 Type 1 diabetes mellitus with diabetic chronic kidney disease: Secondary | ICD-10-CM | POA: Diagnosis not present

## 2023-03-15 DIAGNOSIS — N2581 Secondary hyperparathyroidism of renal origin: Secondary | ICD-10-CM | POA: Diagnosis not present

## 2023-03-15 DIAGNOSIS — D631 Anemia in chronic kidney disease: Secondary | ICD-10-CM | POA: Diagnosis not present

## 2023-03-15 DIAGNOSIS — E1129 Type 2 diabetes mellitus with other diabetic kidney complication: Secondary | ICD-10-CM | POA: Diagnosis not present

## 2023-03-15 DIAGNOSIS — R519 Headache, unspecified: Secondary | ICD-10-CM | POA: Diagnosis not present

## 2023-03-15 DIAGNOSIS — N186 End stage renal disease: Secondary | ICD-10-CM | POA: Diagnosis not present

## 2023-03-15 DIAGNOSIS — Z992 Dependence on renal dialysis: Secondary | ICD-10-CM | POA: Diagnosis not present

## 2023-03-17 ENCOUNTER — Ambulatory Visit (HOSPITAL_COMMUNITY): Admission: RE | Admit: 2023-03-17 | Payer: Medicare Other | Source: Ambulatory Visit

## 2023-03-17 DIAGNOSIS — R519 Headache, unspecified: Secondary | ICD-10-CM | POA: Diagnosis not present

## 2023-03-17 DIAGNOSIS — Z992 Dependence on renal dialysis: Secondary | ICD-10-CM | POA: Diagnosis not present

## 2023-03-17 DIAGNOSIS — D631 Anemia in chronic kidney disease: Secondary | ICD-10-CM | POA: Diagnosis not present

## 2023-03-17 DIAGNOSIS — N2581 Secondary hyperparathyroidism of renal origin: Secondary | ICD-10-CM | POA: Diagnosis not present

## 2023-03-17 DIAGNOSIS — E1129 Type 2 diabetes mellitus with other diabetic kidney complication: Secondary | ICD-10-CM | POA: Diagnosis not present

## 2023-03-17 DIAGNOSIS — N186 End stage renal disease: Secondary | ICD-10-CM | POA: Diagnosis not present

## 2023-03-19 DIAGNOSIS — D631 Anemia in chronic kidney disease: Secondary | ICD-10-CM | POA: Diagnosis not present

## 2023-03-19 DIAGNOSIS — R519 Headache, unspecified: Secondary | ICD-10-CM | POA: Diagnosis not present

## 2023-03-19 DIAGNOSIS — N2581 Secondary hyperparathyroidism of renal origin: Secondary | ICD-10-CM | POA: Diagnosis not present

## 2023-03-19 DIAGNOSIS — Z992 Dependence on renal dialysis: Secondary | ICD-10-CM | POA: Diagnosis not present

## 2023-03-19 DIAGNOSIS — E1129 Type 2 diabetes mellitus with other diabetic kidney complication: Secondary | ICD-10-CM | POA: Diagnosis not present

## 2023-03-19 DIAGNOSIS — N186 End stage renal disease: Secondary | ICD-10-CM | POA: Diagnosis not present

## 2023-03-22 DIAGNOSIS — E1129 Type 2 diabetes mellitus with other diabetic kidney complication: Secondary | ICD-10-CM | POA: Diagnosis not present

## 2023-03-22 DIAGNOSIS — N2581 Secondary hyperparathyroidism of renal origin: Secondary | ICD-10-CM | POA: Diagnosis not present

## 2023-03-22 DIAGNOSIS — R519 Headache, unspecified: Secondary | ICD-10-CM | POA: Diagnosis not present

## 2023-03-22 DIAGNOSIS — D631 Anemia in chronic kidney disease: Secondary | ICD-10-CM | POA: Diagnosis not present

## 2023-03-22 DIAGNOSIS — Z992 Dependence on renal dialysis: Secondary | ICD-10-CM | POA: Diagnosis not present

## 2023-03-22 DIAGNOSIS — N186 End stage renal disease: Secondary | ICD-10-CM | POA: Diagnosis not present

## 2023-03-23 ENCOUNTER — Other Ambulatory Visit (HOSPITAL_COMMUNITY): Payer: Self-pay

## 2023-03-23 ENCOUNTER — Ambulatory Visit (HOSPITAL_COMMUNITY)
Admission: RE | Admit: 2023-03-23 | Discharge: 2023-03-23 | Disposition: A | Discharge status: Home / Self Care | Payer: Medicare Other | Source: Ambulatory Visit | Attending: Internal Medicine | Admitting: Internal Medicine

## 2023-03-23 DIAGNOSIS — R7989 Other specified abnormal findings of blood chemistry: Secondary | ICD-10-CM | POA: Insufficient documentation

## 2023-03-23 DIAGNOSIS — R1084 Generalized abdominal pain: Secondary | ICD-10-CM | POA: Diagnosis not present

## 2023-03-23 DIAGNOSIS — R1010 Upper abdominal pain, unspecified: Secondary | ICD-10-CM | POA: Diagnosis not present

## 2023-03-23 DIAGNOSIS — K7689 Other specified diseases of liver: Secondary | ICD-10-CM | POA: Diagnosis not present

## 2023-03-23 LAB — HEPATITIS C ANTIBODY: Hepatitis C Ab: NONREACTIVE

## 2023-03-23 LAB — HEMOCHROMATOSIS DNA-PCR(C282Y,H63D)

## 2023-03-23 LAB — HEPATITIS B SURFACE ANTIBODY,QUALITATIVE: Hep B S Ab: REACTIVE — AB

## 2023-03-23 LAB — HEPATITIS B SURFACE ANTIGEN: Hepatitis B Surface Ag: NONREACTIVE

## 2023-03-24 DIAGNOSIS — Z992 Dependence on renal dialysis: Secondary | ICD-10-CM | POA: Diagnosis not present

## 2023-03-24 DIAGNOSIS — D631 Anemia in chronic kidney disease: Secondary | ICD-10-CM | POA: Diagnosis not present

## 2023-03-24 DIAGNOSIS — N186 End stage renal disease: Secondary | ICD-10-CM | POA: Diagnosis not present

## 2023-03-24 DIAGNOSIS — E1129 Type 2 diabetes mellitus with other diabetic kidney complication: Secondary | ICD-10-CM | POA: Diagnosis not present

## 2023-03-24 DIAGNOSIS — R519 Headache, unspecified: Secondary | ICD-10-CM | POA: Diagnosis not present

## 2023-03-24 DIAGNOSIS — N2581 Secondary hyperparathyroidism of renal origin: Secondary | ICD-10-CM | POA: Diagnosis not present

## 2023-03-25 ENCOUNTER — Other Ambulatory Visit (HOSPITAL_COMMUNITY): Payer: Self-pay

## 2023-03-25 MED ORDER — OXYCODONE HCL 15 MG PO TABS
15.0000 mg | ORAL_TABLET | Freq: Four times a day (QID) | ORAL | 0 refills | Status: DC | PRN
  Filled 2023-03-25: qty 120, 30d supply, fill #0

## 2023-03-26 DIAGNOSIS — R519 Headache, unspecified: Secondary | ICD-10-CM | POA: Diagnosis not present

## 2023-03-26 DIAGNOSIS — Z992 Dependence on renal dialysis: Secondary | ICD-10-CM | POA: Diagnosis not present

## 2023-03-26 DIAGNOSIS — E1129 Type 2 diabetes mellitus with other diabetic kidney complication: Secondary | ICD-10-CM | POA: Diagnosis not present

## 2023-03-26 DIAGNOSIS — N186 End stage renal disease: Secondary | ICD-10-CM | POA: Diagnosis not present

## 2023-03-26 DIAGNOSIS — N2581 Secondary hyperparathyroidism of renal origin: Secondary | ICD-10-CM | POA: Diagnosis not present

## 2023-03-26 DIAGNOSIS — D631 Anemia in chronic kidney disease: Secondary | ICD-10-CM | POA: Diagnosis not present

## 2023-03-28 ENCOUNTER — Other Ambulatory Visit (HOSPITAL_COMMUNITY): Payer: Self-pay

## 2023-03-28 ENCOUNTER — Other Ambulatory Visit: Payer: Self-pay

## 2023-03-28 DIAGNOSIS — M79671 Pain in right foot: Secondary | ICD-10-CM | POA: Diagnosis not present

## 2023-03-28 DIAGNOSIS — M2042 Other hammer toe(s) (acquired), left foot: Secondary | ICD-10-CM | POA: Diagnosis not present

## 2023-03-28 DIAGNOSIS — E1142 Type 2 diabetes mellitus with diabetic polyneuropathy: Secondary | ICD-10-CM | POA: Insufficient documentation

## 2023-03-28 DIAGNOSIS — S92354D Nondisplaced fracture of fifth metatarsal bone, right foot, subsequent encounter for fracture with routine healing: Secondary | ICD-10-CM | POA: Diagnosis not present

## 2023-03-28 DIAGNOSIS — E1140 Type 2 diabetes mellitus with diabetic neuropathy, unspecified: Secondary | ICD-10-CM | POA: Diagnosis not present

## 2023-03-28 DIAGNOSIS — M2040 Other hammer toe(s) (acquired), unspecified foot: Secondary | ICD-10-CM | POA: Insufficient documentation

## 2023-03-29 ENCOUNTER — Other Ambulatory Visit (HOSPITAL_COMMUNITY): Payer: Self-pay

## 2023-03-29 DIAGNOSIS — N186 End stage renal disease: Secondary | ICD-10-CM | POA: Diagnosis not present

## 2023-03-29 DIAGNOSIS — D631 Anemia in chronic kidney disease: Secondary | ICD-10-CM | POA: Diagnosis not present

## 2023-03-29 DIAGNOSIS — R519 Headache, unspecified: Secondary | ICD-10-CM | POA: Diagnosis not present

## 2023-03-29 DIAGNOSIS — N2581 Secondary hyperparathyroidism of renal origin: Secondary | ICD-10-CM | POA: Diagnosis not present

## 2023-03-29 DIAGNOSIS — Z992 Dependence on renal dialysis: Secondary | ICD-10-CM | POA: Diagnosis not present

## 2023-03-29 DIAGNOSIS — E1129 Type 2 diabetes mellitus with other diabetic kidney complication: Secondary | ICD-10-CM | POA: Diagnosis not present

## 2023-03-29 MED ORDER — LORAZEPAM 2 MG PO TABS
2.0000 mg | ORAL_TABLET | Freq: Two times a day (BID) | ORAL | 5 refills | Status: DC
  Filled 2023-03-29: qty 60, 30d supply, fill #0
  Filled 2023-05-02: qty 60, 30d supply, fill #1
  Filled 2023-06-20: qty 13, 6d supply, fill #2
  Filled 2023-06-20: qty 47, 24d supply, fill #2
  Filled 2023-08-03: qty 60, 30d supply, fill #3
  Filled 2023-09-20: qty 60, 30d supply, fill #4

## 2023-03-29 MED ORDER — BUPROPION HCL ER (XL) 300 MG PO TB24
300.0000 mg | ORAL_TABLET | Freq: Every morning | ORAL | 2 refills | Status: DC
  Filled 2023-03-29: qty 90, 90d supply, fill #0

## 2023-03-29 MED ORDER — FLUOXETINE HCL 40 MG PO CAPS
40.0000 mg | ORAL_CAPSULE | Freq: Every morning | ORAL | 2 refills | Status: DC
  Filled 2023-03-29: qty 90, 90d supply, fill #0
  Filled 2023-06-26: qty 90, 90d supply, fill #1
  Filled 2023-10-03: qty 90, 90d supply, fill #2

## 2023-03-31 DIAGNOSIS — E1129 Type 2 diabetes mellitus with other diabetic kidney complication: Secondary | ICD-10-CM | POA: Diagnosis not present

## 2023-03-31 DIAGNOSIS — N2581 Secondary hyperparathyroidism of renal origin: Secondary | ICD-10-CM | POA: Diagnosis not present

## 2023-03-31 DIAGNOSIS — R519 Headache, unspecified: Secondary | ICD-10-CM | POA: Diagnosis not present

## 2023-03-31 DIAGNOSIS — N186 End stage renal disease: Secondary | ICD-10-CM | POA: Diagnosis not present

## 2023-03-31 DIAGNOSIS — D631 Anemia in chronic kidney disease: Secondary | ICD-10-CM | POA: Diagnosis not present

## 2023-03-31 DIAGNOSIS — Z992 Dependence on renal dialysis: Secondary | ICD-10-CM | POA: Diagnosis not present

## 2023-04-01 ENCOUNTER — Ambulatory Visit: Payer: Self-pay

## 2023-04-01 NOTE — Patient Instructions (Signed)
Visit Information  Thank you for taking time to visit with me today. Please don't hesitate to contact me if I can be of assistance to you.   Following are the goals we discussed today:  Continue to attend Provider appointments as scheduled Continue to take medications as prescribed Please contact RN Care Coordinator if care coordination needs in the future   If you are experiencing a Mental Health or Behavioral Health Crisis or need someone to talk to, please call the Suicide and Crisis Lifeline: 69  Kathyrn Sheriff, RN, MSN, BSN, CCM [facility] Care Coordinator [phone]

## 2023-04-01 NOTE — Patient Outreach (Signed)
  Care Coordination   Follow Up Visit Note   04/01/2023 Name: MIKYAH ALAMO MRN: [id number] DOB: 10/28/1951  JERMANI PUND is a 72 y.o. year old female who sees Plotnikov, Georgina Quint, MD for primary care. I spoke with  Dan Europe by phone today.  What matters to the patients health and wellness today?  Ms. Finnie reports she is following up with providers as scheduled. She reports she has medications and no concerns regarding medication management. Ms. Spallone states she is being followed by GI provider who is helping to manage her health condition. Per Ms. Ke concern she was in liver failure. She expresses per GI provider she is not in liver failure, but having drug related issues. She continues to attend HD as scheduled. Ms. Cataldo has an upcoming appointment with PCP. Ms. Parsell denies any care coordination, disease management or resource needs at this time. Ms. Shein to contact RNCM if care coordination needs in the future. RNCM contact information provided.  Goals Addressed             This Visit's Progress    COMPLETED: Care Coordination Activities       Interventions Today    Flowsheet Row Most Recent Value  Chronic Disease   Chronic disease during today's visit Chronic Kidney Disease/End Stage Renal Disease (ESRD)  General Interventions   General Interventions Discussed/Reviewed General Interventions Reviewed, Doctor Visits  [facility] provided positive feedback regarding patient's self management of health. RNCM provided RNCM contact number and encouraged to call if care coordination or resoure needs in the future.]  Doctor Visits Discussed/Reviewed Doctor Visits Reviewed  [facility] provided resources for providers if needed: contact insurance provider for in-network providers,  [place] physician referral line [phone]  Education Interventions   Education Provided Provided Education  Provided Verbal Education On --  [advised to continue to attend provider visit as  recommended, encouraged to continue to contact provider with health questions or concerns.]  Pharmacy Interventions   Pharmacy Dicussed/Reviewed Pharmacy Topics Discussed  [medication reviewed. patient denies any questions or concerns about medication management.]            SDOH assessments and interventions completed:  No  Care Coordination Interventions:  Yes, provided   Follow up plan: No further intervention required.   Encounter Outcome:  Pt. Visit Completed   Kathyrn Sheriff, RN, MSN, BSN, CCM [facility] Care Coordinator [phone]

## 2023-04-02 DIAGNOSIS — Z992 Dependence on renal dialysis: Secondary | ICD-10-CM | POA: Diagnosis not present

## 2023-04-02 DIAGNOSIS — E1129 Type 2 diabetes mellitus with other diabetic kidney complication: Secondary | ICD-10-CM | POA: Diagnosis not present

## 2023-04-02 DIAGNOSIS — N2581 Secondary hyperparathyroidism of renal origin: Secondary | ICD-10-CM | POA: Diagnosis not present

## 2023-04-02 DIAGNOSIS — R519 Headache, unspecified: Secondary | ICD-10-CM | POA: Diagnosis not present

## 2023-04-02 DIAGNOSIS — D631 Anemia in chronic kidney disease: Secondary | ICD-10-CM | POA: Diagnosis not present

## 2023-04-02 DIAGNOSIS — N186 End stage renal disease: Secondary | ICD-10-CM | POA: Diagnosis not present

## 2023-04-04 ENCOUNTER — Ambulatory Visit (INDEPENDENT_AMBULATORY_CARE_PROVIDER_SITE_OTHER): Payer: Medicare Other | Admitting: Internal Medicine

## 2023-04-04 ENCOUNTER — Encounter: Payer: Self-pay | Admitting: Internal Medicine

## 2023-04-04 ENCOUNTER — Other Ambulatory Visit (HOSPITAL_COMMUNITY): Payer: Self-pay

## 2023-04-04 VITALS — BP 118/60 | HR 98 | Temp 97.9°F | Ht 62.0 in | Wt 218.0 lb

## 2023-04-04 DIAGNOSIS — S46011S Strain of muscle(s) and tendon(s) of the rotator cuff of right shoulder, sequela: Secondary | ICD-10-CM

## 2023-04-04 DIAGNOSIS — E1129 Type 2 diabetes mellitus with other diabetic kidney complication: Secondary | ICD-10-CM | POA: Diagnosis not present

## 2023-04-04 DIAGNOSIS — W19XXXD Unspecified fall, subsequent encounter: Secondary | ICD-10-CM

## 2023-04-04 DIAGNOSIS — Z992 Dependence on renal dialysis: Secondary | ICD-10-CM

## 2023-04-04 DIAGNOSIS — R7989 Other specified abnormal findings of blood chemistry: Secondary | ICD-10-CM | POA: Diagnosis not present

## 2023-04-04 DIAGNOSIS — N186 End stage renal disease: Secondary | ICD-10-CM | POA: Diagnosis not present

## 2023-04-04 DIAGNOSIS — K760 Fatty (change of) liver, not elsewhere classified: Secondary | ICD-10-CM | POA: Diagnosis not present

## 2023-04-04 MED ORDER — KETOCONAZOLE 2 % EX CREA
1.0000 | TOPICAL_CREAM | Freq: Every day | CUTANEOUS | 3 refills | Status: AC
  Filled 2023-04-04: qty 60, 20d supply, fill #0

## 2023-04-04 MED ORDER — PIOGLITAZONE HCL 15 MG PO TABS: 15.0000 mg | ORAL_TABLET | Freq: Every day | ORAL | 2 refills | Status: DC

## 2023-04-04 MED ORDER — OZEMPIC (0.25 OR 0.5 MG/DOSE) 2 MG/3ML ~~LOC~~ SOPN: PEN_INJECTOR | SUBCUTANEOUS | 2 refills | Status: DC

## 2023-04-04 NOTE — Assessment & Plan Note (Signed)
Per Dr Marina Goodell:   1.  Elevated ferritin.  Multiple potential etiologies.  Rule out liver disease.  Rule out hereditary hemochromatosis.  No evidence for liver disease by history, physical exam, or ancillary data such as imaging and laboratories.  Suspect drug related 2.  Multiple significant medical problems including end-stage renal disease on hemodialysis 3.  GERD 4.  History ischemic colitis 5.  History of microscopic colitis   PLAN:   1.  CBC, comprehensive metabolic panel, PT/INR 2.  Repeat iron studies 3.  Chronic hepatitis serologies 4.  Hemochromatosis genetics 5.  Abdominal ultrasound with attention to the liver and spleen 6.  Office follow-up in 4 weeks to review the above A total time of 45 minutes was spent preparing to see the patient, reviewing the myriad of data, obtaining comprehensive history, performing medically appropriate physical examination, counseling and educating the patient regarding the above listed issues, ordering multiple laboratories and serologies, ordering genetic studies, ordering radiology study, arranging follow-up, and documenting clinical information in the health record

## 2023-04-04 NOTE — Progress Notes (Signed)
Subjective:  Patient ID: [name], female    DOB: [birth year]  Age: 72 y.o. MRN: [id number]  CC: Follow-up (Medication issues )   HPI [name] presents for ESRD, DM, OA, chronic pain C/o high ferritin:  "she has had elevated phosphorus levels for which she has been on ferric citrate 3 times daily.  She presents with her blood work from January 28, 2023.  She was noted to have a ferritin level of 1875, iron level 184, and transferrin saturation of 86%.  Her hemoglobin was 10.9 with MCV 105.  Normal platelet count of 209.  Elevated phosphorus of 7.1.  Creatinine 5.4.  Her ferric citrate was stopped.  Not clear if she has received erythropoietin   Repeat laboratories February 25, 2023 revealed a ferritin level of 1392, iron level 56, transferrin saturation 25%.  Hemoglobin 10.7 with MCV 106 and platelets 238,000.  Creatinine 5.4 and phosphorus 8.1.   Last prothrombin time March 2023 was normal at 13.9 with INR 1.1. Iron panel from 2017 showed low iron saturation at 13.7% Previous hepatitis B studies were negative.   She denies a personal or family history of liver disease.  No significant history of alcohol consumption.  She has been chronically obese.  A CT scan of the abdomen and pelvis with contrast March 09, 2022 revealed no abnormality of the liver.  The spleen was normal size.  Abdominal ultrasound August 2022 found no abnormalities of the liver.  Liver test from March 2023 were normal " - per Dr Marina Goodell  Outpatient Medications Prior to Visit  Medication Sig Dispense Refill   Accu-Chek Softclix Lancets lancets Use 1 lancet up to 4 times daily as directed 100 each 0   acetaminophen (TYLENOL) 500 MG tablet Take 1,000 mg by mouth 2 (two) times daily as needed for headache.     acidophilus (RISAQUAD) CAPS capsule Take 1 capsule by mouth daily.     aspirin-acetaminophen-caffeine (EXCEDRIN MIGRAINE) 250-250-65 MG tablet Take 1 tablet by mouth daily as needed for headache or migraine. Max 3  doses in one week     B Complex-C-Folic Acid (RENA-VITE PO) Take 1 tablet by mouth every morning.     blood glucose meter kit and supplies Dispense based on patient and insurance preference. Used to check blood sugar daily, DX: E11.9 OneTouch 1 each 0   Blood Glucose Monitoring Suppl (ACCU-CHEK GUIDE ME) w/Device KIT Use as directed to check blood sugar four times daily. 1 kit 0   buPROPion (WELLBUTRIN XL) 300 MG 24 hr tablet TAKE 1 TABLET BY MOUTH AT  BEDTIME 90 tablet 2   buPROPion (WELLBUTRIN XL) 300 MG 24 hr tablet Take 1 tablet (300 mg total) by mouth in the morning. 90 tablet 2   carboxymethylcellulose (REFRESH PLUS) 0.5 % SOLN Place 1 drop into both eyes 3 (three) times daily as needed (dry eyes/irritation).     ciclopirox (PENLAC) 8 % solution Apply topically at bedtime. Apply over nail and surrounding skin daily 6.6 mL 1   cyclobenzaprine (FLEXERIL) 10 MG tablet Take 1 tablet (10 mg total) by mouth 3 (three) times daily as needed for muscle spasms. 270 tablet 0   ezetimibe (ZETIA) 10 MG tablet TAKE 1 TABLET BY MOUTH DAILY 90 tablet 3   ferric citrate (AURYXIA) 1 GM 210 MG(Fe) tablet Take 210 mg by mouth 3 (three) times daily with meals.     FLUoxetine (PROZAC) 20 MG capsule Take 1 capsule (20 mg total) by mouth in the  morning. 90 capsule 1   FLUoxetine (PROZAC) 40 MG capsule Take 1 capsule (40 mg total) by mouth in the morning. 90 capsule 2   glimepiride (AMARYL) 1 MG tablet Take 1 tablet (1 mg total) by mouth daily with breakfast. 90 tablet 3   glucose blood (ACCU-CHEK GUIDE) test strip Use to check blood sugar up to four times daily. 50 each 0   glucose blood (TRUETEST TEST) test strip Used to check blood sugar daily, DX: E11.9 100 each 3   influenza vaccine adjuvanted (FLUAD) 0.5 ML injection Inject 0.5 mLs into the muscle. 0.5 mL 0   ketoconazole (NIZORAL) 2 % cream Apply 1 application  topically daily.     lidocaine (LIDODERM) 5 % PLACE 2 PATCHES ONTO THE SKIN  DAILY 120 patch 1    lidocaine (XYLOCAINE) 5 % ointment Apply 1 application topically 3 (three) times daily as needed for moderate pain. 90 g 0   LORazepam (ATIVAN) 2 MG tablet Take 1 tablet (2 mg total) by mouth 2 (two) times daily as needed for anxiety 60 tablet 5   LORazepam (ATIVAN) 2 MG tablet Take 1 tablet (2 mg total) by mouth 2 (two) times daily as needed for anxiety 60 tablet 5   methylPREDNISolone (MEDROL) 4 MG TBPK tablet take as directed per pack 21 tablet 0   midodrine (PROAMATINE) 5 MG tablet Take 1-2 tablet by mouth three times a week as directed Take 30 minutes before dialysis and another tablet during middle of dialysis treatment (Patient taking differently: Take 5 mg by mouth See admin instructions. Take 5 mg by mouth at 5:30 am  and 8:30 am on Tuesday, Thursday and Saturday.) 90 tablet 3   multivitamin-lutein (OCUVITE-LUTEIN) CAPS capsule Take 1 capsule by mouth every morning.     naloxone (NARCAN) nasal spray 4 mg/0.1 mL Take by nasal route every 3 minutes until patient awakes or EMS arrives. 2 each 0   ondansetron (ZOFRAN) 4 MG tablet Take 1 tablet (4 mg total) by mouth every 8 (eight) hours as needed for nausea or vomiting. 10 tablet 0   oxyCODONE (ROXICODONE) 15 MG immediate release tablet Take 1 tablet (15 mg) by mouth 4 times a day as needed. 120 tablet 0   oxyCODONE (ROXICODONE) 15 MG immediate release tablet Take 1 tablet (15 mg total) by mouth 4 (four) times daily as needed. 120 tablet 0   pantoprazole (PROTONIX) 40 MG tablet TAKE 1 TABLET BY MOUTH DAILY 100 tablet 2   pilocarpine (SALAGEN) 5 MG tablet TAKE 1 TABLET BY MOUTH TWICE  DAILY 180 tablet 3   pioglitazone (ACTOS) 15 MG tablet TAKE 1 TABLET BY MOUTH DAILY 100 tablet 2   polyethylene glycol (MIRALAX / GLYCOLAX) 17 g packet Take 17 g by mouth every 7 (seven) days.     PREVIDENT 5000 DRY MOUTH 1.1 % GEL dental gel Place 1 application  onto teeth in the morning and at bedtime.     No facility-administered medications prior to visit.     ROS: Review of Systems  Constitutional:  Positive for fatigue. Negative for activity change, appetite change, chills and unexpected weight change.  HENT:  Negative for congestion, mouth sores and sinus pressure.   Eyes:  Negative for visual disturbance.  Respiratory:  Negative for cough and chest tightness.   Gastrointestinal:  Negative for abdominal pain and nausea.  Genitourinary:  Negative for difficulty urinating, frequency and vaginal pain.  Musculoskeletal:  Positive for arthralgias, back pain and gait problem.  Skin:  Negative for pallor and rash.  Neurological:  Negative for dizziness, tremors, weakness, numbness and headaches.  Psychiatric/Behavioral:  Negative for confusion, sleep disturbance and suicidal ideas. The patient is nervous/anxious.     Objective:  BP 118/60 (BP Location: Left Arm, Patient Position: Sitting, Cuff Size: Normal)   Pulse 98   Temp 97.9 F (36.6 C) (Oral)   Ht 5\' 2"  (1.575 m)   Wt 218 lb (98.9 kg)   SpO2 95%   BMI 39.87 kg/m   BP Readings from Last 3 Encounters:  04/04/23 118/60  03/09/23 138/82  01/24/23 124/80    Wt Readings from Last 3 Encounters:  04/04/23 218 lb (98.9 kg)  03/09/23 215 lb (97.5 kg)  01/24/23 216 lb 8 oz (98.2 kg)    Physical Exam Constitutional:      General: She is not in acute distress.    Appearance: She is well-developed. She is obese.  HENT:     Head: Normocephalic.     Right Ear: External ear normal.     Left Ear: External ear normal.     Nose: Nose normal.  Eyes:     General:        Right eye: No discharge.        Left eye: No discharge.     Conjunctiva/sclera: Conjunctivae normal.     Pupils: Pupils are equal, round, and reactive to light.  Neck:     Thyroid: No thyromegaly.     Vascular: No JVD.     Trachea: No tracheal deviation.  Cardiovascular:     Rate and Rhythm: Normal rate and regular rhythm.     Heart sounds: Normal heart sounds.  Pulmonary:     Effort: No respiratory distress.      Breath sounds: No stridor. No wheezing.  Abdominal:     General: Bowel sounds are normal. There is no distension.     Palpations: Abdomen is soft. There is no mass.     Tenderness: There is no abdominal tenderness. There is no guarding or rebound.  Musculoskeletal:        General: No tenderness.     Cervical back: Normal range of motion and neck supple. No rigidity.  Lymphadenopathy:     Cervical: No cervical adenopathy.  Skin:    Findings: No erythema or rash.  Neurological:     Cranial Nerves: No cranial nerve deficit.     Motor: No abnormal muscle tone.     Coordination: Coordination normal.     Deep Tendon Reflexes: Reflexes normal.  Psychiatric:        Behavior: Behavior normal.        Thought Content: Thought content normal.        Judgment: Judgment normal.   Using a cane Antalgic gait  Lab Results  Component Value Date   WBC 12.3 (H) 03/09/2023   HGB 11.2 (L) 03/09/2023   HCT 34.3 (L) 03/09/2023   PLT 271.0 03/09/2023   GLUCOSE 192 (H) 03/09/2023   CHOL 211 (H) 05/14/2022   TRIG 118 05/14/2022   HDL 55 05/14/2022   LDLCALC 135 (H) 05/14/2022   ALT 13 03/09/2023   AST 17 03/09/2023   NA 135 03/09/2023   K 5.1 03/09/2023   CL 89 (L) 03/09/2023   CREATININE 5.05 (HH) 03/09/2023   BUN 44 (H) 03/09/2023   CO2 27 03/09/2023   TSH 3.98 04/16/2020   INR 0.9 03/09/2023   HGBA1C 6.3 (H) 03/10/2022    US Abdomen Complete  Result Date: 03/23/2023 CLINICAL DATA:  Elevated ferritin EXAM: ABDOMEN ULTRASOUND COMPLETE COMPARISON:  None Available. FINDINGS: Gallbladder: No gallstones or wall thickening visualized. No sonographic Murphy sign noted by sonographer. Common bile duct: Diameter: 3.4 mm Liver: Diffuse increased echogenicity. No focal mass. Portal vein is patent on color Doppler imaging with normal direction of blood flow towards the liver. IVC: No abnormality visualized. Pancreas: Visualized portion unremarkable. Spleen: Size and appearance within normal  limits. Right Kidney: Length: 6.2 cm.  Diffuse increased echogenicity. Left Kidney: Length: 7.8 cm. Diffuse increased echogenicity. Contains multiple cysts with the largest measuring 3.4 cm. No follow-up imaging recommended for the cysts. Abdominal aorta: No aneurysm visualized. Other findings: None. IMPRESSION: 1. Diffuse increased echogenicity throughout the liver is nonspecific and may be seen with hepatic steatosis or other underlying intrinsic liver disease. 2. Normal directional blood flow in the portal vein. 3. The kidneys are small and echogenic consistent with chronic medical renal disease. Electronically Signed   By: Gerome Sam III M.D.   On: 03/23/2023 14:19    Assessment & Plan:   Problem List Items Addressed This Visit       Other   Elevated ferritin level - Primary    Per Dr Marina Goodell:   1.  Elevated ferritin.  Multiple potential etiologies.  Rule out liver disease.  Rule out hereditary hemochromatosis.  No evidence for liver disease by history, physical exam, or ancillary data such as imaging and laboratories.  Suspect drug related 2.  Multiple significant medical problems including end-stage renal disease on hemodialysis 3.  GERD 4.  History ischemic colitis 5.  History of microscopic colitis   PLAN:   1.  CBC, comprehensive metabolic panel, PT/INR 2.  Repeat iron studies 3.  Chronic hepatitis serologies 4.  Hemochromatosis genetics 5.  Abdominal ultrasound with attention to the liver and spleen 6.  Office follow-up in 4 weeks to review the above A total time of 45 minutes was spent preparing to see the patient, reviewing the myriad of data, obtaining comprehensive history, performing medically appropriate physical examination, counseling and educating the patient regarding the above listed issues, ordering multiple laboratories and serologies, ordering genetic studies, ordering radiology study, arranging follow-up, and documenting clinical information in the health  record           No orders of the defined types were placed in this encounter.     Follow-up: No follow-ups on file.  Sonda Primes, MD

## 2023-04-04 NOTE — Assessment & Plan Note (Signed)
On HD 

## 2023-04-04 NOTE — Assessment & Plan Note (Signed)
Dr Rennis Chris S/p R shoulder replacement

## 2023-04-04 NOTE — Assessment & Plan Note (Signed)
Pt fell x2 in the past 12 months Using a cane

## 2023-04-04 NOTE — Addendum Note (Signed)
Addended by: Tresa Garter on: 04/04/2023 02:42 PM   Modules accepted: Orders, Level of Service

## 2023-04-04 NOTE — Assessment & Plan Note (Signed)
Options to treat discussed 

## 2023-04-04 NOTE — Assessment & Plan Note (Signed)
Check A1c. 

## 2023-04-05 DIAGNOSIS — Z992 Dependence on renal dialysis: Secondary | ICD-10-CM | POA: Diagnosis not present

## 2023-04-05 DIAGNOSIS — D631 Anemia in chronic kidney disease: Secondary | ICD-10-CM | POA: Diagnosis not present

## 2023-04-05 DIAGNOSIS — E1129 Type 2 diabetes mellitus with other diabetic kidney complication: Secondary | ICD-10-CM | POA: Diagnosis not present

## 2023-04-05 DIAGNOSIS — R519 Headache, unspecified: Secondary | ICD-10-CM | POA: Diagnosis not present

## 2023-04-05 DIAGNOSIS — N2581 Secondary hyperparathyroidism of renal origin: Secondary | ICD-10-CM | POA: Diagnosis not present

## 2023-04-05 DIAGNOSIS — N186 End stage renal disease: Secondary | ICD-10-CM | POA: Diagnosis not present

## 2023-04-06 ENCOUNTER — Other Ambulatory Visit: Payer: Medicare Other

## 2023-04-07 DIAGNOSIS — N186 End stage renal disease: Secondary | ICD-10-CM | POA: Diagnosis not present

## 2023-04-07 DIAGNOSIS — Z992 Dependence on renal dialysis: Secondary | ICD-10-CM | POA: Diagnosis not present

## 2023-04-07 DIAGNOSIS — R519 Headache, unspecified: Secondary | ICD-10-CM | POA: Diagnosis not present

## 2023-04-07 DIAGNOSIS — N2581 Secondary hyperparathyroidism of renal origin: Secondary | ICD-10-CM | POA: Diagnosis not present

## 2023-04-07 DIAGNOSIS — D631 Anemia in chronic kidney disease: Secondary | ICD-10-CM | POA: Diagnosis not present

## 2023-04-07 DIAGNOSIS — E1129 Type 2 diabetes mellitus with other diabetic kidney complication: Secondary | ICD-10-CM | POA: Diagnosis not present

## 2023-04-09 DIAGNOSIS — R519 Headache, unspecified: Secondary | ICD-10-CM | POA: Diagnosis not present

## 2023-04-09 DIAGNOSIS — N2581 Secondary hyperparathyroidism of renal origin: Secondary | ICD-10-CM | POA: Diagnosis not present

## 2023-04-09 DIAGNOSIS — E1129 Type 2 diabetes mellitus with other diabetic kidney complication: Secondary | ICD-10-CM | POA: Diagnosis not present

## 2023-04-09 DIAGNOSIS — N186 End stage renal disease: Secondary | ICD-10-CM | POA: Diagnosis not present

## 2023-04-09 DIAGNOSIS — Z992 Dependence on renal dialysis: Secondary | ICD-10-CM | POA: Diagnosis not present

## 2023-04-09 DIAGNOSIS — D631 Anemia in chronic kidney disease: Secondary | ICD-10-CM | POA: Diagnosis not present

## 2023-04-11 ENCOUNTER — Other Ambulatory Visit: Payer: Self-pay | Admitting: Internal Medicine

## 2023-04-12 DIAGNOSIS — D631 Anemia in chronic kidney disease: Secondary | ICD-10-CM | POA: Diagnosis not present

## 2023-04-12 DIAGNOSIS — N186 End stage renal disease: Secondary | ICD-10-CM | POA: Diagnosis not present

## 2023-04-12 DIAGNOSIS — E1022 Type 1 diabetes mellitus with diabetic chronic kidney disease: Secondary | ICD-10-CM | POA: Diagnosis not present

## 2023-04-12 DIAGNOSIS — N2581 Secondary hyperparathyroidism of renal origin: Secondary | ICD-10-CM | POA: Diagnosis not present

## 2023-04-12 DIAGNOSIS — R519 Headache, unspecified: Secondary | ICD-10-CM | POA: Diagnosis not present

## 2023-04-12 DIAGNOSIS — Z992 Dependence on renal dialysis: Secondary | ICD-10-CM | POA: Diagnosis not present

## 2023-04-12 DIAGNOSIS — E1129 Type 2 diabetes mellitus with other diabetic kidney complication: Secondary | ICD-10-CM | POA: Diagnosis not present

## 2023-04-13 ENCOUNTER — Other Ambulatory Visit: Payer: Self-pay | Admitting: Internal Medicine

## 2023-04-13 ENCOUNTER — Other Ambulatory Visit: Payer: Self-pay

## 2023-04-13 ENCOUNTER — Other Ambulatory Visit (HOSPITAL_COMMUNITY): Payer: Self-pay

## 2023-04-13 MED ORDER — CYCLOBENZAPRINE HCL 10 MG PO TABS
10.0000 mg | ORAL_TABLET | Freq: Three times a day (TID) | ORAL | 0 refills | Status: DC | PRN
  Filled 2023-04-13: qty 270, 90d supply, fill #0

## 2023-04-14 DIAGNOSIS — N186 End stage renal disease: Secondary | ICD-10-CM | POA: Diagnosis not present

## 2023-04-14 DIAGNOSIS — D631 Anemia in chronic kidney disease: Secondary | ICD-10-CM | POA: Diagnosis not present

## 2023-04-14 DIAGNOSIS — R519 Headache, unspecified: Secondary | ICD-10-CM | POA: Diagnosis not present

## 2023-04-14 DIAGNOSIS — Z992 Dependence on renal dialysis: Secondary | ICD-10-CM | POA: Diagnosis not present

## 2023-04-14 DIAGNOSIS — R52 Pain, unspecified: Secondary | ICD-10-CM | POA: Diagnosis not present

## 2023-04-14 DIAGNOSIS — N2581 Secondary hyperparathyroidism of renal origin: Secondary | ICD-10-CM | POA: Diagnosis not present

## 2023-04-16 DIAGNOSIS — R52 Pain, unspecified: Secondary | ICD-10-CM | POA: Diagnosis not present

## 2023-04-16 DIAGNOSIS — D631 Anemia in chronic kidney disease: Secondary | ICD-10-CM | POA: Diagnosis not present

## 2023-04-16 DIAGNOSIS — N186 End stage renal disease: Secondary | ICD-10-CM | POA: Diagnosis not present

## 2023-04-16 DIAGNOSIS — R519 Headache, unspecified: Secondary | ICD-10-CM | POA: Diagnosis not present

## 2023-04-16 DIAGNOSIS — Z992 Dependence on renal dialysis: Secondary | ICD-10-CM | POA: Diagnosis not present

## 2023-04-16 DIAGNOSIS — N2581 Secondary hyperparathyroidism of renal origin: Secondary | ICD-10-CM | POA: Diagnosis not present

## 2023-04-19 DIAGNOSIS — D631 Anemia in chronic kidney disease: Secondary | ICD-10-CM | POA: Diagnosis not present

## 2023-04-19 DIAGNOSIS — N2581 Secondary hyperparathyroidism of renal origin: Secondary | ICD-10-CM | POA: Diagnosis not present

## 2023-04-19 DIAGNOSIS — R519 Headache, unspecified: Secondary | ICD-10-CM | POA: Diagnosis not present

## 2023-04-19 DIAGNOSIS — R52 Pain, unspecified: Secondary | ICD-10-CM | POA: Diagnosis not present

## 2023-04-19 DIAGNOSIS — N186 End stage renal disease: Secondary | ICD-10-CM | POA: Diagnosis not present

## 2023-04-19 DIAGNOSIS — Z992 Dependence on renal dialysis: Secondary | ICD-10-CM | POA: Diagnosis not present

## 2023-04-21 DIAGNOSIS — N186 End stage renal disease: Secondary | ICD-10-CM | POA: Diagnosis not present

## 2023-04-21 DIAGNOSIS — R519 Headache, unspecified: Secondary | ICD-10-CM | POA: Diagnosis not present

## 2023-04-21 DIAGNOSIS — D631 Anemia in chronic kidney disease: Secondary | ICD-10-CM | POA: Diagnosis not present

## 2023-04-21 DIAGNOSIS — Z992 Dependence on renal dialysis: Secondary | ICD-10-CM | POA: Diagnosis not present

## 2023-04-21 DIAGNOSIS — N2581 Secondary hyperparathyroidism of renal origin: Secondary | ICD-10-CM | POA: Diagnosis not present

## 2023-04-21 DIAGNOSIS — R52 Pain, unspecified: Secondary | ICD-10-CM | POA: Diagnosis not present

## 2023-04-23 DIAGNOSIS — R52 Pain, unspecified: Secondary | ICD-10-CM | POA: Diagnosis not present

## 2023-04-23 DIAGNOSIS — N186 End stage renal disease: Secondary | ICD-10-CM | POA: Diagnosis not present

## 2023-04-23 DIAGNOSIS — R519 Headache, unspecified: Secondary | ICD-10-CM | POA: Diagnosis not present

## 2023-04-23 DIAGNOSIS — D631 Anemia in chronic kidney disease: Secondary | ICD-10-CM | POA: Diagnosis not present

## 2023-04-23 DIAGNOSIS — N2581 Secondary hyperparathyroidism of renal origin: Secondary | ICD-10-CM | POA: Diagnosis not present

## 2023-04-23 DIAGNOSIS — Z992 Dependence on renal dialysis: Secondary | ICD-10-CM | POA: Diagnosis not present

## 2023-04-26 DIAGNOSIS — D631 Anemia in chronic kidney disease: Secondary | ICD-10-CM | POA: Diagnosis not present

## 2023-04-26 DIAGNOSIS — N186 End stage renal disease: Secondary | ICD-10-CM | POA: Diagnosis not present

## 2023-04-26 DIAGNOSIS — R519 Headache, unspecified: Secondary | ICD-10-CM | POA: Diagnosis not present

## 2023-04-26 DIAGNOSIS — N2581 Secondary hyperparathyroidism of renal origin: Secondary | ICD-10-CM | POA: Diagnosis not present

## 2023-04-26 DIAGNOSIS — Z992 Dependence on renal dialysis: Secondary | ICD-10-CM | POA: Diagnosis not present

## 2023-04-26 DIAGNOSIS — R52 Pain, unspecified: Secondary | ICD-10-CM | POA: Diagnosis not present

## 2023-04-28 ENCOUNTER — Ambulatory Visit: Payer: Medicare Other | Admitting: Internal Medicine

## 2023-04-28 ENCOUNTER — Encounter: Payer: Self-pay | Admitting: Internal Medicine

## 2023-04-28 VITALS — BP 100/58 | HR 88 | Ht 62.0 in | Wt 211.4 lb

## 2023-04-28 DIAGNOSIS — R52 Pain, unspecified: Secondary | ICD-10-CM | POA: Diagnosis not present

## 2023-04-28 DIAGNOSIS — N186 End stage renal disease: Secondary | ICD-10-CM | POA: Diagnosis not present

## 2023-04-28 DIAGNOSIS — N2581 Secondary hyperparathyroidism of renal origin: Secondary | ICD-10-CM | POA: Diagnosis not present

## 2023-04-28 DIAGNOSIS — K219 Gastro-esophageal reflux disease without esophagitis: Secondary | ICD-10-CM | POA: Diagnosis not present

## 2023-04-28 DIAGNOSIS — D631 Anemia in chronic kidney disease: Secondary | ICD-10-CM | POA: Diagnosis not present

## 2023-04-28 DIAGNOSIS — Z86010 Personal history of colonic polyps: Secondary | ICD-10-CM

## 2023-04-28 DIAGNOSIS — R519 Headache, unspecified: Secondary | ICD-10-CM | POA: Diagnosis not present

## 2023-04-28 DIAGNOSIS — R490 Dysphonia: Secondary | ICD-10-CM

## 2023-04-28 DIAGNOSIS — R7989 Other specified abnormal findings of blood chemistry: Secondary | ICD-10-CM | POA: Diagnosis not present

## 2023-04-28 DIAGNOSIS — Z992 Dependence on renal dialysis: Secondary | ICD-10-CM | POA: Diagnosis not present

## 2023-04-28 MED ORDER — PANTOPRAZOLE SODIUM 40 MG PO TBEC: 40.0000 mg | DELAYED_RELEASE_TABLET | Freq: Two times a day (BID) | ORAL | 3 refills | Status: DC

## 2023-04-28 NOTE — Progress Notes (Signed)
HISTORY OF PRESENT ILLNESS:  [name] is a 72 y.o. female, retired hospital endoscopy nurse, with multiple significant medical problems including end-stage renal disease for which she is on hemodialysis.  She was last seen in this office March 09, 2023 regarding markedly elevated ferritin level.  See that dictation for details.  She underwent CBC, comprehensive metabolic panel, PT/INR.  She was anemic.  Her liver tests were normal.  Albumin normal.  Coagulation studies normal.  Chronic hepatitis serologies were negative.  She is immune to hepatitis B.  Hemochromatosis genetics were normal.  Abdominal ultrasound revealed fatty liver but was otherwise normal.  She follows up at this time.  She brings with her blood work from Apr 21, 2023.  Ferritin level 1564.  Hemoglobin 10.4.  Saturation 36%.  She has a new complaint of hoarseness which began in the late winter.  She has this off and on.  She has also noticed a recent weeks breakthrough reflux symptoms despite pantoprazole 40 mg once daily.  She wonders if her hoarseness might not be related to reflux.  No dysphagia.  No weight loss.  REVIEW OF SYSTEMS:  All non-GI ROS negative as otherwise stated in the HPI except for fatigue  Past Medical History:  Diagnosis Date   Anemia    Anxiety    Brain tumor (benign) ([name])    Colitis 2010   microscopic- Dr Marina Goodell   Depression    Diabetes mellitus    type II   Dyspnea    with exertion   ESRD (end stage renal disease) ([name])    TTUSAT [address]    Fibromyalgia    GERD (gastroesophageal reflux disease)    Headache    History of blood transfusion    after knee surgery   Hypertension    discontinued all diuretics and antihypertensives   IBS (irritable bowel syndrome)    LBP (low back pain)    Neuropathy    feet bilat    Osteoarthritis    Osteopenia    Pneumonia    hx of 2014    Rotator cuff tear, right    Sinusitis    currently being treated with antibiotic will complete 03/04/2015     Past Surgical History:  Procedure Laterality Date   AV FISTULA PLACEMENT Right 09/12/2020   Procedure: RIGHT ARTERIOVENOUS (AV) FISTULA CREATION;  Surgeon: Sherren Kerns, MD;  Location: [facility] OR;  Service: Vascular;  Laterality: Right;   BASCILIC VEIN TRANSPOSITION Right 10/27/2020   Procedure: RIGHT UPPER EXTREMITY SECOND STAGE BASCILIC VEIN TRANSPOSITION;  Surgeon: Sherren Kerns, MD;  Location: [facility] OR;  Service: Vascular;  Laterality: Right;   BIOPSY  03/11/2022   Procedure: BIOPSY;  Surgeon: Lemar Lofty., MD;  Location: [facility] ENDOSCOPY;  Service: Gastroenterology;;   COLONOSCOPY WITH PROPOFOL N/A 03/11/2022   Procedure: COLONOSCOPY WITH PROPOFOL;  Surgeon: Lemar Lofty., MD;  Location: [facility] ENDOSCOPY;  Service: Gastroenterology;  Laterality: N/A;   foramen magnum ependymoma surgery  2003   Dr Jule Ser   JOINT REPLACEMENT Bilateral    NASAL SINUS SURGERY     1973    REVERSE SHOULDER ARTHROPLASTY Right 06/03/2022   Procedure: REVERSE SHOULDER ARTHROPLASTY;  Surgeon: Francena Hanly, MD;  Location: [facility] ORS;  Service: Orthopedics;  Laterality: Right;    TONSILLECTOMY     TOTAL KNEE ARTHROPLASTY     L 2008, R 2009, R 2016- Dr Despina Hick   TOTAL KNEE REVISION Right 03/05/2015   Procedure: RIGHT TOTAL KNEE ARTHROPLASTY REVISION;  Surgeon: Ollen Gross, MD;  Location: [facility] ORS;  Service: Orthopedics;  Laterality: Right;    Social History EKATERINI LACE  reports that she quit smoking about 34 years ago. Her smoking use included cigarettes. She has a 20.00 pack-year smoking history. She has never used smokeless tobacco. She reports that she does not currently use alcohol. She reports that she does not use drugs.  family history includes Cancer in her brother; Crohn's disease in her maternal uncle; Depression in her mother; Diabetes in her brother, brother, father, mother, and another family member; Heart attack in her brother; Heart disease in her brother and brother;  Hyperlipidemia in her brother, brother, and father; Hypertension in her brother, brother, father, and mother; Stroke in her brother; Stroke (age of onset: 35) in her mother.  Allergies  Allergen Reactions   Erythromycin Diarrhea and Nausea And Vomiting   Gabapentin Other (See Comments)    Confusion and falling  Hallucinations       PHYSICAL EXAMINATION: Vital signs: BP (!) 100/58 (BP Location: Left Arm, Patient Position: Sitting, Cuff Size: Normal)   Pulse 88   Ht 5\' 2"  (1.575 m)   Wt 211 lb 6 oz (95.9 kg)   BMI 38.66 kg/m   Constitutional: Overweight somewhat fatigued appearing but otherwise generally well-appearing, no acute distress Psychiatric: alert and oriented x3, cooperative Eyes: extraocular movements intact, anicteric, conjunctiva pink Mouth: oral pharynx moist, no lesions Neck: supple no lymphadenopathy Cardiovascular: heart regular rate and rhythm, no murmur Lungs: clear to auscultation bilaterally Abdomen: soft, nontender, nondistended, no obvious ascites, no peritoneal signs, normal bowel sounds, no organomegaly Rectal: Omitted Extremities: no clubbing or cyanosis or significant lower extremity edema bilaterally.  AV fistula right arm Skin: no lesions on visible extremities Neuro: No focal deficits. No asterixis.    ASSESSMENT:  1.  Elevated ferritin.  Acute phase reactant.  No evidence for liver disease or hemochromatosis. 2.  GERD.  Has been having breakthrough symptoms 3.  Hoarseness.  Possibly related to GERD.  Rule out other ENT diagnoses 4.  HISTORY OF ISCHEMIC COLITIS 5.  HISTORY OF MICROSCOPIC COLITIS 6.  Multiple significant medical problems   PLAN:  1.  Reassurance regarding her liver 2.  Review all of the above workup 3.  Increase pantoprazole to 40 mg p.o. twice daily.  Prescribed. 4.  I have asked her to schedule appointment with ENT for formal evaluation of her hoarseness 5.  General medical care with PCP and nephrologist 6.  GI follow-up  as needed A total time of 30 minutes was spent preparing to see the patient, reviewing area data, obtaining comprehensive history, performing medically appropriate physical exam, counseling and educating the patient regarding the above listed issues, ordering medication, and documenting clinical information in the health record.

## 2023-04-28 NOTE — Patient Instructions (Signed)
_______________________________________________________  If your blood pressure at your visit was 140/90 or greater, please contact your primary care physician to follow up on this.  _______________________________________________________  If you are age 71 or older, your body mass index should be between 23-30. Your Body mass index is 38.66 kg/m. If this is out of the aforementioned range listed, please consider follow up with your Primary Care Provider.  If you are age 31 or younger, your body mass index should be between 19-25. Your Body mass index is 38.66 kg/m. If this is out of the aformentioned range listed, please consider follow up with your Primary Care Provider.   ________________________________________________________  The [facility] providers would like to encourage you to use [facility] to communicate with providers for non-urgent requests or questions.  Due to long hold times on the telephone, sending your provider a message by [facility] may be a faster and more efficient way to get a response.  Please allow 48 business hours for a response.  Please remember that this is for non-urgent requests.  _______________________________________________________  We have sent the following medications to your pharmacy for you to pick up at your convenience:  pantoprazole 40mg  twice a day  Please follow up with ENT

## 2023-04-29 ENCOUNTER — Other Ambulatory Visit (HOSPITAL_COMMUNITY): Payer: Self-pay

## 2023-04-29 MED ORDER — OXYCODONE HCL 15 MG PO TABS
15.0000 mg | ORAL_TABLET | Freq: Four times a day (QID) | ORAL | 0 refills | Status: DC
  Filled 2023-04-29: qty 120, 30d supply, fill #0

## 2023-04-30 DIAGNOSIS — N2581 Secondary hyperparathyroidism of renal origin: Secondary | ICD-10-CM | POA: Diagnosis not present

## 2023-04-30 DIAGNOSIS — R52 Pain, unspecified: Secondary | ICD-10-CM | POA: Diagnosis not present

## 2023-04-30 DIAGNOSIS — N186 End stage renal disease: Secondary | ICD-10-CM | POA: Diagnosis not present

## 2023-04-30 DIAGNOSIS — D631 Anemia in chronic kidney disease: Secondary | ICD-10-CM | POA: Diagnosis not present

## 2023-04-30 DIAGNOSIS — Z992 Dependence on renal dialysis: Secondary | ICD-10-CM | POA: Diagnosis not present

## 2023-04-30 DIAGNOSIS — R519 Headache, unspecified: Secondary | ICD-10-CM | POA: Diagnosis not present

## 2023-05-02 ENCOUNTER — Other Ambulatory Visit (HOSPITAL_COMMUNITY): Payer: Self-pay

## 2023-05-02 ENCOUNTER — Telehealth: Payer: Self-pay | Admitting: Cardiovascular Disease

## 2023-05-02 NOTE — Telephone Encounter (Signed)
Spoke to patient she stated her left ear had a fluttering sensation last week that lasted appox 1 day.No fluttering sensation since.She does have dizziness at times.She noticed she got dizzy last week at [facility] when she was up high in balcony.She has had inner ear.She was told she could have a blockage in carotid artery.Advised if dizziness continues she should see PCP.Advised to keep appointment already scheduled with Dr.Croitoru 7/22.Advised to call sooner if she has any more fluttering in left ear.I will send message to Dr.Croitoru.

## 2023-05-02 NOTE — Telephone Encounter (Signed)
Symptoms of fluttering in ear do not sound like they are related to a carotid blockage. Dizziness can have multiple different causes. Can discuss at upcoming appt. Let us know if the symptoms worsen.

## 2023-05-02 NOTE — Telephone Encounter (Signed)
Spoke to patient Dr.Croitoru's advice given.Advised to call sooner if needed.

## 2023-05-02 NOTE — Telephone Encounter (Signed)
Patient states she is hearing fluttering in her left ear and was told this could be a heart issue and would like a call back to discuss.

## 2023-05-03 DIAGNOSIS — Z992 Dependence on renal dialysis: Secondary | ICD-10-CM | POA: Diagnosis not present

## 2023-05-03 DIAGNOSIS — N186 End stage renal disease: Secondary | ICD-10-CM | POA: Diagnosis not present

## 2023-05-03 DIAGNOSIS — N2581 Secondary hyperparathyroidism of renal origin: Secondary | ICD-10-CM | POA: Diagnosis not present

## 2023-05-03 DIAGNOSIS — R52 Pain, unspecified: Secondary | ICD-10-CM | POA: Diagnosis not present

## 2023-05-03 DIAGNOSIS — R519 Headache, unspecified: Secondary | ICD-10-CM | POA: Diagnosis not present

## 2023-05-03 DIAGNOSIS — D631 Anemia in chronic kidney disease: Secondary | ICD-10-CM | POA: Diagnosis not present

## 2023-05-05 ENCOUNTER — Other Ambulatory Visit (HOSPITAL_COMMUNITY): Payer: Self-pay

## 2023-05-05 DIAGNOSIS — N186 End stage renal disease: Secondary | ICD-10-CM | POA: Diagnosis not present

## 2023-05-05 DIAGNOSIS — R519 Headache, unspecified: Secondary | ICD-10-CM | POA: Diagnosis not present

## 2023-05-05 DIAGNOSIS — Z992 Dependence on renal dialysis: Secondary | ICD-10-CM | POA: Diagnosis not present

## 2023-05-05 DIAGNOSIS — R52 Pain, unspecified: Secondary | ICD-10-CM | POA: Diagnosis not present

## 2023-05-05 DIAGNOSIS — D631 Anemia in chronic kidney disease: Secondary | ICD-10-CM | POA: Diagnosis not present

## 2023-05-05 DIAGNOSIS — N2581 Secondary hyperparathyroidism of renal origin: Secondary | ICD-10-CM | POA: Diagnosis not present

## 2023-05-07 DIAGNOSIS — N186 End stage renal disease: Secondary | ICD-10-CM | POA: Diagnosis not present

## 2023-05-07 DIAGNOSIS — R519 Headache, unspecified: Secondary | ICD-10-CM | POA: Diagnosis not present

## 2023-05-07 DIAGNOSIS — N2581 Secondary hyperparathyroidism of renal origin: Secondary | ICD-10-CM | POA: Diagnosis not present

## 2023-05-07 DIAGNOSIS — D631 Anemia in chronic kidney disease: Secondary | ICD-10-CM | POA: Diagnosis not present

## 2023-05-07 DIAGNOSIS — R52 Pain, unspecified: Secondary | ICD-10-CM | POA: Diagnosis not present

## 2023-05-07 DIAGNOSIS — Z992 Dependence on renal dialysis: Secondary | ICD-10-CM | POA: Diagnosis not present

## 2023-05-10 DIAGNOSIS — M542 Cervicalgia: Secondary | ICD-10-CM | POA: Diagnosis not present

## 2023-05-10 DIAGNOSIS — N2581 Secondary hyperparathyroidism of renal origin: Secondary | ICD-10-CM | POA: Diagnosis not present

## 2023-05-10 DIAGNOSIS — R52 Pain, unspecified: Secondary | ICD-10-CM | POA: Diagnosis not present

## 2023-05-10 DIAGNOSIS — D631 Anemia in chronic kidney disease: Secondary | ICD-10-CM | POA: Diagnosis not present

## 2023-05-10 DIAGNOSIS — N186 End stage renal disease: Secondary | ICD-10-CM | POA: Diagnosis not present

## 2023-05-10 DIAGNOSIS — E1140 Type 2 diabetes mellitus with diabetic neuropathy, unspecified: Secondary | ICD-10-CM | POA: Diagnosis not present

## 2023-05-10 DIAGNOSIS — M4716 Other spondylosis with myelopathy, lumbar region: Secondary | ICD-10-CM | POA: Diagnosis not present

## 2023-05-10 DIAGNOSIS — Z992 Dependence on renal dialysis: Secondary | ICD-10-CM | POA: Diagnosis not present

## 2023-05-10 DIAGNOSIS — R519 Headache, unspecified: Secondary | ICD-10-CM | POA: Diagnosis not present

## 2023-05-12 DIAGNOSIS — N186 End stage renal disease: Secondary | ICD-10-CM | POA: Diagnosis not present

## 2023-05-12 DIAGNOSIS — D631 Anemia in chronic kidney disease: Secondary | ICD-10-CM | POA: Diagnosis not present

## 2023-05-12 DIAGNOSIS — R519 Headache, unspecified: Secondary | ICD-10-CM | POA: Diagnosis not present

## 2023-05-12 DIAGNOSIS — N2581 Secondary hyperparathyroidism of renal origin: Secondary | ICD-10-CM | POA: Diagnosis not present

## 2023-05-12 DIAGNOSIS — R52 Pain, unspecified: Secondary | ICD-10-CM | POA: Diagnosis not present

## 2023-05-12 DIAGNOSIS — Z992 Dependence on renal dialysis: Secondary | ICD-10-CM | POA: Diagnosis not present

## 2023-05-13 DIAGNOSIS — Z992 Dependence on renal dialysis: Secondary | ICD-10-CM | POA: Diagnosis not present

## 2023-05-13 DIAGNOSIS — E1022 Type 1 diabetes mellitus with diabetic chronic kidney disease: Secondary | ICD-10-CM | POA: Diagnosis not present

## 2023-05-13 DIAGNOSIS — N186 End stage renal disease: Secondary | ICD-10-CM | POA: Diagnosis not present

## 2023-05-14 DIAGNOSIS — Z992 Dependence on renal dialysis: Secondary | ICD-10-CM | POA: Diagnosis not present

## 2023-05-14 DIAGNOSIS — D631 Anemia in chronic kidney disease: Secondary | ICD-10-CM | POA: Diagnosis not present

## 2023-05-14 DIAGNOSIS — G8929 Other chronic pain: Secondary | ICD-10-CM | POA: Diagnosis not present

## 2023-05-14 DIAGNOSIS — R52 Pain, unspecified: Secondary | ICD-10-CM | POA: Diagnosis not present

## 2023-05-14 DIAGNOSIS — R519 Headache, unspecified: Secondary | ICD-10-CM | POA: Diagnosis not present

## 2023-05-14 DIAGNOSIS — N2581 Secondary hyperparathyroidism of renal origin: Secondary | ICD-10-CM | POA: Diagnosis not present

## 2023-05-14 DIAGNOSIS — N186 End stage renal disease: Secondary | ICD-10-CM | POA: Diagnosis not present

## 2023-05-16 DIAGNOSIS — Z96611 Presence of right artificial shoulder joint: Secondary | ICD-10-CM | POA: Diagnosis not present

## 2023-05-17 DIAGNOSIS — D631 Anemia in chronic kidney disease: Secondary | ICD-10-CM | POA: Diagnosis not present

## 2023-05-17 DIAGNOSIS — N2581 Secondary hyperparathyroidism of renal origin: Secondary | ICD-10-CM | POA: Diagnosis not present

## 2023-05-17 DIAGNOSIS — Z992 Dependence on renal dialysis: Secondary | ICD-10-CM | POA: Diagnosis not present

## 2023-05-17 DIAGNOSIS — N186 End stage renal disease: Secondary | ICD-10-CM | POA: Diagnosis not present

## 2023-05-17 DIAGNOSIS — R519 Headache, unspecified: Secondary | ICD-10-CM | POA: Diagnosis not present

## 2023-05-17 DIAGNOSIS — R52 Pain, unspecified: Secondary | ICD-10-CM | POA: Diagnosis not present

## 2023-05-17 DIAGNOSIS — G8929 Other chronic pain: Secondary | ICD-10-CM | POA: Diagnosis not present

## 2023-05-19 DIAGNOSIS — Z992 Dependence on renal dialysis: Secondary | ICD-10-CM | POA: Diagnosis not present

## 2023-05-19 DIAGNOSIS — R52 Pain, unspecified: Secondary | ICD-10-CM | POA: Diagnosis not present

## 2023-05-19 DIAGNOSIS — D631 Anemia in chronic kidney disease: Secondary | ICD-10-CM | POA: Diagnosis not present

## 2023-05-19 DIAGNOSIS — N2581 Secondary hyperparathyroidism of renal origin: Secondary | ICD-10-CM | POA: Diagnosis not present

## 2023-05-19 DIAGNOSIS — R519 Headache, unspecified: Secondary | ICD-10-CM | POA: Diagnosis not present

## 2023-05-19 DIAGNOSIS — G8929 Other chronic pain: Secondary | ICD-10-CM | POA: Diagnosis not present

## 2023-05-19 DIAGNOSIS — N186 End stage renal disease: Secondary | ICD-10-CM | POA: Diagnosis not present

## 2023-05-20 ENCOUNTER — Encounter: Payer: Self-pay | Admitting: Nurse Practitioner

## 2023-05-20 ENCOUNTER — Ambulatory Visit (INDEPENDENT_AMBULATORY_CARE_PROVIDER_SITE_OTHER): Payer: Medicare Other | Admitting: Nurse Practitioner

## 2023-05-20 ENCOUNTER — Other Ambulatory Visit (HOSPITAL_COMMUNITY): Payer: Self-pay

## 2023-05-20 VITALS — BP 132/70 | HR 83 | Temp 98.6°F | Ht 62.0 in | Wt 213.0 lb

## 2023-05-20 DIAGNOSIS — J0100 Acute maxillary sinusitis, unspecified: Secondary | ICD-10-CM

## 2023-05-20 MED ORDER — CEFUROXIME AXETIL 250 MG PO TABS
250.0000 mg | ORAL_TABLET | Freq: Two times a day (BID) | ORAL | 0 refills | Status: AC
  Filled 2023-05-20: qty 20, 10d supply, fill #0

## 2023-05-20 NOTE — Patient Instructions (Signed)
It was great to see you!  Start ceftin twice a day for 10 days.  Keep taking the mucinex and tylenol as needed  Drink some fluids as you can with dialysis.   Let's follow-up if your symptoms worsen or don't improve.   Take care,  Rodman Pickle, NP

## 2023-05-20 NOTE — Progress Notes (Signed)
Acute Office Visit  Subjective:     Patient ID: [name], female    DOB: 1951/01/01, 72 y.o.   MRN: [id number]  Chief Complaint  Patient presents with   Cough    Non productive cough, sinus drainage    HPI Patient is in today for cough that started 5 days ago.  UPPER RESPIRATORY TRACT INFECTION  Fever: no Cough: yes - thick yellow Shortness of breath: no Wheezing: no Chest pain: no Chest tightness: no Chest congestion: no Nasal congestion: yes Runny nose: yes Post nasal drip: yes Sneezing: no Sore throat: yes Swollen glands: no Sinus pressure: yes Headache: yes Face pain: no Toothache: no Ear pain: no bilateral Ear pressure: yes bilateral Eyes red/itching:no Eye drainage/crusting: no  Vomiting: no Rash: no Fatigue: yes Sick contacts: no Strep contacts: no  Context: worse Recurrent sinusitis: no Relief with OTC cold/cough medications: yes  Treatments attempted: mucinex, tylenol   ROS See pertinent positives and negatives per HPI.     Objective:    BP 132/70 (BP Location: Right Arm)   Pulse 83   Temp 98.6 F (37 C) (Oral)   Ht 5\' 2"  (1.575 m)   Wt 213 lb (96.6 kg)   SpO2 97%   BMI 38.96 kg/m    Physical Exam Vitals and nursing note reviewed.  Constitutional:      General: She is not in acute distress.    Appearance: Normal appearance.  HENT:     Head: Normocephalic.     Right Ear: Tympanic membrane, ear canal and external ear normal.     Left Ear: Tympanic membrane, ear canal and external ear normal.     Nose:     Right Sinus: Maxillary sinus tenderness present. No frontal sinus tenderness.     Left Sinus: Maxillary sinus tenderness present. No frontal sinus tenderness.     Mouth/Throat:     Pharynx: Posterior oropharyngeal erythema present. No oropharyngeal exudate.  Eyes:     Conjunctiva/sclera: Conjunctivae normal.  Cardiovascular:     Rate and Rhythm: Normal rate and regular rhythm.     Pulses: Normal pulses.     Heart  sounds: Normal heart sounds.  Pulmonary:     Effort: Pulmonary effort is normal.     Breath sounds: Normal breath sounds.  Musculoskeletal:     Cervical back: Normal range of motion.  Skin:    General: Skin is warm.  Neurological:     General: No focal deficit present.     Mental Status: She is alert and oriented to person, place, and time.  Psychiatric:        Mood and Affect: Mood normal.        Behavior: Behavior normal.        Thought Content: Thought content normal.        Judgment: Judgment normal.       Assessment & Plan:   Problem List Items Addressed This Visit       Respiratory   Acute non-recurrent maxillary sinusitis - Primary   Relevant Medications   cefUROXime (CEFTIN) 250 MG tablet    Meds ordered this encounter  Medications   cefUROXime (CEFTIN) 250 MG tablet    Sig: Take 1 tablet (250 mg total) by mouth 2 (two) times daily with a meal for 10 days.    Dispense:  20 tablet    Refill:  0    Return if symptoms worsen or fail to improve.  Gerre Scull, NP

## 2023-05-21 DIAGNOSIS — G8929 Other chronic pain: Secondary | ICD-10-CM | POA: Diagnosis not present

## 2023-05-21 DIAGNOSIS — D631 Anemia in chronic kidney disease: Secondary | ICD-10-CM | POA: Diagnosis not present

## 2023-05-21 DIAGNOSIS — N2581 Secondary hyperparathyroidism of renal origin: Secondary | ICD-10-CM | POA: Diagnosis not present

## 2023-05-21 DIAGNOSIS — R519 Headache, unspecified: Secondary | ICD-10-CM | POA: Diagnosis not present

## 2023-05-21 DIAGNOSIS — Z992 Dependence on renal dialysis: Secondary | ICD-10-CM | POA: Diagnosis not present

## 2023-05-21 DIAGNOSIS — R52 Pain, unspecified: Secondary | ICD-10-CM | POA: Diagnosis not present

## 2023-05-21 DIAGNOSIS — N186 End stage renal disease: Secondary | ICD-10-CM | POA: Diagnosis not present

## 2023-05-24 DIAGNOSIS — G8929 Other chronic pain: Secondary | ICD-10-CM | POA: Diagnosis not present

## 2023-05-24 DIAGNOSIS — D631 Anemia in chronic kidney disease: Secondary | ICD-10-CM | POA: Diagnosis not present

## 2023-05-24 DIAGNOSIS — Z992 Dependence on renal dialysis: Secondary | ICD-10-CM | POA: Diagnosis not present

## 2023-05-24 DIAGNOSIS — R52 Pain, unspecified: Secondary | ICD-10-CM | POA: Diagnosis not present

## 2023-05-24 DIAGNOSIS — N186 End stage renal disease: Secondary | ICD-10-CM | POA: Diagnosis not present

## 2023-05-24 DIAGNOSIS — R519 Headache, unspecified: Secondary | ICD-10-CM | POA: Diagnosis not present

## 2023-05-24 DIAGNOSIS — N2581 Secondary hyperparathyroidism of renal origin: Secondary | ICD-10-CM | POA: Diagnosis not present

## 2023-05-26 DIAGNOSIS — N2581 Secondary hyperparathyroidism of renal origin: Secondary | ICD-10-CM | POA: Diagnosis not present

## 2023-05-26 DIAGNOSIS — R52 Pain, unspecified: Secondary | ICD-10-CM | POA: Diagnosis not present

## 2023-05-26 DIAGNOSIS — G8929 Other chronic pain: Secondary | ICD-10-CM | POA: Diagnosis not present

## 2023-05-26 DIAGNOSIS — R519 Headache, unspecified: Secondary | ICD-10-CM | POA: Diagnosis not present

## 2023-05-26 DIAGNOSIS — D631 Anemia in chronic kidney disease: Secondary | ICD-10-CM | POA: Diagnosis not present

## 2023-05-26 DIAGNOSIS — N186 End stage renal disease: Secondary | ICD-10-CM | POA: Diagnosis not present

## 2023-05-26 DIAGNOSIS — Z992 Dependence on renal dialysis: Secondary | ICD-10-CM | POA: Diagnosis not present

## 2023-05-28 DIAGNOSIS — N186 End stage renal disease: Secondary | ICD-10-CM | POA: Diagnosis not present

## 2023-05-28 DIAGNOSIS — R52 Pain, unspecified: Secondary | ICD-10-CM | POA: Diagnosis not present

## 2023-05-28 DIAGNOSIS — D631 Anemia in chronic kidney disease: Secondary | ICD-10-CM | POA: Diagnosis not present

## 2023-05-28 DIAGNOSIS — R519 Headache, unspecified: Secondary | ICD-10-CM | POA: Diagnosis not present

## 2023-05-28 DIAGNOSIS — N2581 Secondary hyperparathyroidism of renal origin: Secondary | ICD-10-CM | POA: Diagnosis not present

## 2023-05-28 DIAGNOSIS — Z992 Dependence on renal dialysis: Secondary | ICD-10-CM | POA: Diagnosis not present

## 2023-05-28 DIAGNOSIS — G8929 Other chronic pain: Secondary | ICD-10-CM | POA: Diagnosis not present

## 2023-05-31 ENCOUNTER — Other Ambulatory Visit (HOSPITAL_COMMUNITY): Payer: Self-pay

## 2023-05-31 DIAGNOSIS — D631 Anemia in chronic kidney disease: Secondary | ICD-10-CM | POA: Diagnosis not present

## 2023-05-31 DIAGNOSIS — N2581 Secondary hyperparathyroidism of renal origin: Secondary | ICD-10-CM | POA: Diagnosis not present

## 2023-05-31 DIAGNOSIS — Z992 Dependence on renal dialysis: Secondary | ICD-10-CM | POA: Diagnosis not present

## 2023-05-31 DIAGNOSIS — R52 Pain, unspecified: Secondary | ICD-10-CM | POA: Diagnosis not present

## 2023-05-31 DIAGNOSIS — G8929 Other chronic pain: Secondary | ICD-10-CM | POA: Diagnosis not present

## 2023-05-31 DIAGNOSIS — R519 Headache, unspecified: Secondary | ICD-10-CM | POA: Diagnosis not present

## 2023-05-31 DIAGNOSIS — N186 End stage renal disease: Secondary | ICD-10-CM | POA: Diagnosis not present

## 2023-05-31 MED ORDER — NALOXONE HCL 4 MG/0.1ML NA LIQD
NASAL | 0 refills | Status: DC
  Filled 2023-05-31: qty 2, 30d supply, fill #0

## 2023-05-31 MED ORDER — OXYCODONE HCL 15 MG PO TABS
15.0000 mg | ORAL_TABLET | Freq: Four times a day (QID) | ORAL | 0 refills | Status: DC | PRN
  Filled 2023-05-31: qty 120, 30d supply, fill #0

## 2023-06-01 ENCOUNTER — Other Ambulatory Visit: Payer: Self-pay

## 2023-06-01 ENCOUNTER — Other Ambulatory Visit (HOSPITAL_COMMUNITY): Payer: Self-pay

## 2023-06-02 ENCOUNTER — Other Ambulatory Visit: Payer: Self-pay | Admitting: Internal Medicine

## 2023-06-02 DIAGNOSIS — D631 Anemia in chronic kidney disease: Secondary | ICD-10-CM | POA: Diagnosis not present

## 2023-06-02 DIAGNOSIS — N2581 Secondary hyperparathyroidism of renal origin: Secondary | ICD-10-CM | POA: Diagnosis not present

## 2023-06-02 DIAGNOSIS — R519 Headache, unspecified: Secondary | ICD-10-CM | POA: Diagnosis not present

## 2023-06-02 DIAGNOSIS — Z992 Dependence on renal dialysis: Secondary | ICD-10-CM | POA: Diagnosis not present

## 2023-06-02 DIAGNOSIS — N186 End stage renal disease: Secondary | ICD-10-CM | POA: Diagnosis not present

## 2023-06-02 DIAGNOSIS — R52 Pain, unspecified: Secondary | ICD-10-CM | POA: Diagnosis not present

## 2023-06-02 DIAGNOSIS — G8929 Other chronic pain: Secondary | ICD-10-CM | POA: Diagnosis not present

## 2023-06-03 ENCOUNTER — Other Ambulatory Visit (HOSPITAL_COMMUNITY): Payer: Self-pay

## 2023-06-04 DIAGNOSIS — R52 Pain, unspecified: Secondary | ICD-10-CM | POA: Diagnosis not present

## 2023-06-04 DIAGNOSIS — R519 Headache, unspecified: Secondary | ICD-10-CM | POA: Diagnosis not present

## 2023-06-04 DIAGNOSIS — D631 Anemia in chronic kidney disease: Secondary | ICD-10-CM | POA: Diagnosis not present

## 2023-06-04 DIAGNOSIS — N2581 Secondary hyperparathyroidism of renal origin: Secondary | ICD-10-CM | POA: Diagnosis not present

## 2023-06-04 DIAGNOSIS — N186 End stage renal disease: Secondary | ICD-10-CM | POA: Diagnosis not present

## 2023-06-04 DIAGNOSIS — Z992 Dependence on renal dialysis: Secondary | ICD-10-CM | POA: Diagnosis not present

## 2023-06-04 DIAGNOSIS — G8929 Other chronic pain: Secondary | ICD-10-CM | POA: Diagnosis not present

## 2023-06-07 ENCOUNTER — Encounter (HOSPITAL_COMMUNITY): Payer: Self-pay

## 2023-06-07 ENCOUNTER — Emergency Department (HOSPITAL_COMMUNITY)
Admission: EM | Admit: 2023-06-07 | Discharge: 2023-06-08 | Discharge status: Against Medical Advice | Payer: Medicare Other | Attending: Emergency Medicine | Admitting: Emergency Medicine

## 2023-06-07 ENCOUNTER — Emergency Department (HOSPITAL_COMMUNITY): Payer: Medicare Other

## 2023-06-07 DIAGNOSIS — R069 Unspecified abnormalities of breathing: Secondary | ICD-10-CM | POA: Diagnosis not present

## 2023-06-07 DIAGNOSIS — Z96611 Presence of right artificial shoulder joint: Secondary | ICD-10-CM | POA: Diagnosis not present

## 2023-06-07 DIAGNOSIS — N186 End stage renal disease: Secondary | ICD-10-CM | POA: Diagnosis not present

## 2023-06-07 DIAGNOSIS — Z992 Dependence on renal dialysis: Secondary | ICD-10-CM | POA: Diagnosis not present

## 2023-06-07 DIAGNOSIS — R079 Chest pain, unspecified: Secondary | ICD-10-CM | POA: Diagnosis not present

## 2023-06-07 DIAGNOSIS — Z5321 Procedure and treatment not carried out due to patient leaving prior to being seen by health care provider: Secondary | ICD-10-CM | POA: Insufficient documentation

## 2023-06-07 DIAGNOSIS — N2581 Secondary hyperparathyroidism of renal origin: Secondary | ICD-10-CM | POA: Diagnosis not present

## 2023-06-07 DIAGNOSIS — D631 Anemia in chronic kidney disease: Secondary | ICD-10-CM | POA: Diagnosis not present

## 2023-06-07 DIAGNOSIS — M79601 Pain in right arm: Secondary | ICD-10-CM | POA: Insufficient documentation

## 2023-06-07 DIAGNOSIS — M25561 Pain in right knee: Secondary | ICD-10-CM | POA: Diagnosis not present

## 2023-06-07 DIAGNOSIS — R519 Headache, unspecified: Secondary | ICD-10-CM | POA: Diagnosis not present

## 2023-06-07 DIAGNOSIS — S299XXA Unspecified injury of thorax, initial encounter: Secondary | ICD-10-CM | POA: Diagnosis not present

## 2023-06-07 DIAGNOSIS — R52 Pain, unspecified: Secondary | ICD-10-CM | POA: Diagnosis not present

## 2023-06-07 DIAGNOSIS — G8929 Other chronic pain: Secondary | ICD-10-CM | POA: Diagnosis not present

## 2023-06-07 DIAGNOSIS — W01198A Fall on same level from slipping, tripping and stumbling with subsequent striking against other object, initial encounter: Secondary | ICD-10-CM | POA: Insufficient documentation

## 2023-06-07 NOTE — ED Triage Notes (Signed)
Pt states she tripped up and fell today hitting her chest on the corner of a coffee table at home. Endorses some ancillary pain in her right knee and right arm, but most of the concern is the pain felt in her chest from hitting her sternum that also is causing some difficulty breathing. No injury to the head, No LOC. No neck pain and back pain.

## 2023-06-08 ENCOUNTER — Ambulatory Visit: Payer: Medicare Other

## 2023-06-08 ENCOUNTER — Ambulatory Visit (INDEPENDENT_AMBULATORY_CARE_PROVIDER_SITE_OTHER): Payer: Medicare Other | Admitting: Internal Medicine

## 2023-06-08 ENCOUNTER — Encounter: Payer: Self-pay | Admitting: Internal Medicine

## 2023-06-08 ENCOUNTER — Ambulatory Visit (INDEPENDENT_AMBULATORY_CARE_PROVIDER_SITE_OTHER): Payer: Medicare Other

## 2023-06-08 VITALS — BP 114/62 | HR 90 | Temp 98.9°F | Ht 62.0 in | Wt 213.0 lb

## 2023-06-08 DIAGNOSIS — E1129 Type 2 diabetes mellitus with other diabetic kidney complication: Secondary | ICD-10-CM | POA: Diagnosis not present

## 2023-06-08 DIAGNOSIS — Z7985 Long-term (current) use of injectable non-insulin antidiabetic drugs: Secondary | ICD-10-CM

## 2023-06-08 DIAGNOSIS — R079 Chest pain, unspecified: Secondary | ICD-10-CM

## 2023-06-08 DIAGNOSIS — I10 Essential (primary) hypertension: Secondary | ICD-10-CM | POA: Diagnosis not present

## 2023-06-08 NOTE — Patient Instructions (Signed)
Please continue all other medications as before, and refills have been done if requested.  Please have the pharmacy call with any other refills you may need.  Please keep your appointments with your specialists as you may have planned  You will be contacted regarding the referral for: Chest CT scan  Please go to the XRAY Department in the first floor for the x-ray testing  You will be contacted by phone if any changes need to be made immediately.  Otherwise, you will receive a letter about your results with an explanation, but please check with MyChart first.  Please remember to sign up for MyChart if you have not done so, as this will be important to you in the future with finding out test results, communicating by private email, and scheduling acute appointments online when needed.

## 2023-06-08 NOTE — Progress Notes (Signed)
Patient ID: [name], female   DOB: [birth year], 72 y.o.   MRN: [id number]        Chief Complaint: follow up sternal chest pain, htn, dm       HPI:  [name] is a 72 y.o. female here after an unfortunate fall last PM with trip over cuff of long pants, and fell striking the sternum on the hard corner of a walnut table.  Had immediate sharp pain, did not hear any pop but has hx of falls with mulitple rib fractures in past, now concerned about the sternum.  Has some mild soreness bilateral otherwise, but no point tenderness of any ribs this time.  Pt denies other chest pain, increased sob or doe, wheezing, orthopnea, PND, increased LE swelling, palpitations, dizziness or syncope.   Pt denies polydipsia, polyuria, or new focal neuro s/s.    Pt denies fever, wt loss, night sweats, loss of appetite, or other constitutional symptoms         Wt Readings from Last 3 Encounters:  06/08/23 213 lb (96.6 kg)  05/20/23 213 lb (96.6 kg)  04/28/23 211 lb 6 oz (95.9 kg)   BP Readings from Last 3 Encounters:  06/08/23 114/62  06/07/23 (!) 158/73  05/20/23 132/70         Past Medical History:  Diagnosis Date   Anemia    Anxiety    Brain tumor (benign) ([name])    Colitis 2010   microscopic- Dr Marina Goodell   Depression    Diabetes mellitus    type II   Dyspnea    with exertion   ESRD (end stage renal disease) ([name])    TTUSAT [address]    Fibromyalgia    GERD (gastroesophageal reflux disease)    Headache    History of blood transfusion    after knee surgery   Hypertension    discontinued all diuretics and antihypertensives   IBS (irritable bowel syndrome)    LBP (low back pain)    Neuropathy    feet bilat    Osteoarthritis    Osteopenia    Pneumonia    hx of 2014    Rotator cuff tear, right    Sinusitis    currently being treated with antibiotic will complete 03/04/2015   Past Surgical History:  Procedure Laterality Date   AV FISTULA PLACEMENT Right 09/12/2020   Procedure: RIGHT  ARTERIOVENOUS (AV) FISTULA CREATION;  Surgeon: Sherren Kerns, MD;  Location: [facility] OR;  Service: Vascular;  Laterality: Right;   BASCILIC VEIN TRANSPOSITION Right 10/27/2020   Procedure: RIGHT UPPER EXTREMITY SECOND STAGE BASCILIC VEIN TRANSPOSITION;  Surgeon: Sherren Kerns, MD;  Location: [facility] OR;  Service: Vascular;  Laterality: Right;   BIOPSY  03/11/2022   Procedure: BIOPSY;  Surgeon: Lemar Lofty., MD;  Location: [facility] ENDOSCOPY;  Service: Gastroenterology;;   COLONOSCOPY WITH PROPOFOL N/A 03/11/2022   Procedure: COLONOSCOPY WITH PROPOFOL;  Surgeon: Lemar Lofty., MD;  Location: [facility] ENDOSCOPY;  Service: Gastroenterology;  Laterality: N/A;   foramen magnum ependymoma surgery  2003   Dr Jule Ser   JOINT REPLACEMENT Bilateral    NASAL SINUS SURGERY     1973    REVERSE SHOULDER ARTHROPLASTY Right 06/03/2022   Procedure: REVERSE SHOULDER ARTHROPLASTY;  Surgeon: Francena Hanly, MD;  Location: [facility] ORS;  Service: Orthopedics;  Laterality: Right;    TONSILLECTOMY     TOTAL KNEE ARTHROPLASTY     L 2008, R 2009, R 2016- Dr Despina Hick  TOTAL KNEE REVISION Right 03/05/2015   Procedure: RIGHT TOTAL KNEE ARTHROPLASTY REVISION;  Surgeon: Ollen Gross, MD;  Location: [facility] ORS;  Service: Orthopedics;  Laterality: Right;    reports that she quit smoking about 34 years ago. Her smoking use included cigarettes. She has a 20.00 pack-year smoking history. She has never used smokeless tobacco. She reports that she does not currently use alcohol. She reports that she does not use drugs. family history includes Cancer in her brother; Crohn's disease in her maternal uncle; Depression in her mother; Diabetes in her brother, brother, father, mother, and another family member; Heart attack in her brother; Heart disease in her brother and brother; Hyperlipidemia in her brother, brother, and father; Hypertension in her brother, brother, father, and mother; Stroke in her brother; Stroke (age of onset: 20)  in her mother. Allergies  Allergen Reactions   Erythromycin Diarrhea and Nausea And Vomiting   Gabapentin Other (See Comments)    Confusion and falling  Hallucinations   Current Outpatient Medications on File Prior to Visit  Medication Sig Dispense Refill   Accu-Chek Softclix Lancets lancets Use 1 lancet up to 4 times daily as directed 100 each 0   acetaminophen (TYLENOL) 500 MG tablet Take 1,000 mg by mouth 2 (two) times daily as needed for headache.     aspirin-acetaminophen-caffeine (EXCEDRIN MIGRAINE) 250-250-65 MG tablet Take 1 tablet by mouth daily as needed for headache or migraine. Max 3 doses in one week     B Complex-C-Folic Acid (RENA-VITE PO) Take 1 tablet by mouth every morning.     blood glucose meter kit and supplies Dispense based on patient and insurance preference. Used to check blood sugar daily, DX: E11.9 OneTouch 1 each 0   Blood Glucose Monitoring Suppl (ACCU-CHEK GUIDE ME) w/Device KIT Use as directed to check blood sugar four times daily. 1 kit 0   buPROPion (WELLBUTRIN XL) 300 MG 24 hr tablet Take 1 tablet (300 mg total) by mouth in the morning. 90 tablet 2   carboxymethylcellulose (REFRESH PLUS) 0.5 % SOLN Place 1 drop into both eyes 3 (three) times daily as needed (dry eyes/irritation).     cefUROXime (CEFTIN) 250 MG tablet Take 250 mg by mouth 2 (two) times daily with a meal.     ciclopirox (PENLAC) 8 % solution Apply topically at bedtime. Apply over nail and surrounding skin daily 6.6 mL 1   cyclobenzaprine (FLEXERIL) 10 MG tablet Take 1 tablet (10 mg total) by mouth 3 (three) times daily as needed for muscle spasms. 270 tablet 0   ezetimibe (ZETIA) 10 MG tablet TAKE 1 TABLET BY MOUTH DAILY 90 tablet 3   FLUoxetine (PROZAC) 40 MG capsule Take 1 capsule (40 mg total) by mouth in the morning. 90 capsule 2   glimepiride (AMARYL) 1 MG tablet TAKE 1 TABLET BY MOUTH DAILY  WITH BREAKFAST 100 tablet 2   glucose blood (ACCU-CHEK GUIDE) test strip Use to check blood sugar  up to four times daily. 50 each 0   glucose blood (TRUETEST TEST) test strip Used to check blood sugar daily, DX: E11.9 100 each 3   influenza vaccine adjuvanted (FLUAD) 0.5 ML injection Inject 0.5 mLs into the muscle. 0.5 mL 0   ketoconazole (NIZORAL) 2 % cream Apply 1 Application topically daily. 60 g 3   lanthanum (FOSRENOL) 1000 MG chewable tablet Chew 1,000 mg by mouth. Taking 2 with meals and 1 with snacks     lidocaine (LIDODERM) 5 % PLACE 2 PATCHES ONTO  THE SKIN  DAILY 120 patch 1   lidocaine (XYLOCAINE) 5 % ointment Apply 1 application topically 3 (three) times daily as needed for moderate pain. 90 g 0   LORazepam (ATIVAN) 2 MG tablet Take 1 tablet (2 mg total) by mouth 2 (two) times daily as needed for anxiety 60 tablet 5   midodrine (PROAMATINE) 5 MG tablet Take 1-2 tablet by mouth three times a week as directed Take 30 minutes before dialysis and another tablet during middle of dialysis treatment (Patient taking differently: Take 5 mg by mouth See admin instructions. Take 5 mg by mouth at 5:30 am  and 8:30 am on Tuesday, Thursday and Saturday.) 90 tablet 3   multivitamin-lutein (OCUVITE-LUTEIN) CAPS capsule Take 1 capsule by mouth every morning.     naloxone (NARCAN) nasal spray 4 mg/0.1 mL Take by nasal route every 3 minutes until patient awakes or EMS arrives. 2 each 0   naloxone (NARCAN) nasal spray 4 mg/0.1 mL Use 1 spray in nostril every 3 minutes until patient awakes or EMS arrives. 2 each 0   ondansetron (ZOFRAN) 4 MG tablet Take 1 tablet (4 mg total) by mouth every 8 (eight) hours as needed for nausea or vomiting. 10 tablet 0   oxyCODONE (ROXICODONE) 15 MG immediate release tablet Take 1 tablet (15 mg total) by mouth 4 (four) times daily as needed. 120 tablet 0   oxyCODONE (ROXICODONE) 15 MG immediate release tablet Take 1 tablet (15 mg total) by mouth 4 (four) times daily as needed 120 tablet 0   oxyCODONE (ROXICODONE) 15 MG immediate release tablet Take 1 tablet (15 mg total) by  mouth 4 (four) times daily as needed. 120 tablet 0   pantoprazole (PROTONIX) 40 MG tablet Take 1 tablet (40 mg total) by mouth 2 (two) times daily. 180 tablet 3   pilocarpine (SALAGEN) 5 MG tablet TAKE 1 TABLET BY MOUTH TWICE  DAILY 180 tablet 3   pioglitazone (ACTOS) 15 MG tablet Take 1 tablet (15 mg total) by mouth daily. 100 tablet 2   polyethylene glycol (MIRALAX / GLYCOLAX) 17 g packet Take 17 g by mouth every 7 (seven) days.     PREVIDENT 5000 DRY MOUTH 1.1 % GEL dental gel Place 1 application  onto teeth in the morning and at bedtime.     Semaglutide,0.25 or 0.5MG /DOS, (OZEMPIC, 0.25 OR 0.5 MG/DOSE,) 2 MG/3ML SOPN INJECT SUBCUTANEOUSLY  0.5 MG WEEKLY 9 mL 3   No current facility-administered medications on file prior to visit.        ROS:  All others reviewed and negative.  Objective        PE:  BP 114/62 (BP Location: Left Arm, Patient Position: Sitting, Cuff Size: Normal)   Pulse 90   Temp 98.9 F (37.2 C) (Oral)   Ht 5\' 2"  (1.575 m)   Wt 213 lb (96.6 kg)   SpO2 98%   BMI 38.96 kg/m                 Constitutional: Pt appears in NAD               HENT: Head: NCAT.                Right Ear: External ear normal.                 Left Ear: External ear normal.                Eyes: . Pupils are equal,  round, and reactive to light. Conjunctivae and EOM are normal               Nose: without d/c or deformity               Neck: Neck supple. Gross normal ROM               Cardiovascular: Normal rate and regular rhythm.  With tender area without swelling or bruising to the mid lower sternum, no obvious bony abnormality                Pulmonary/Chest: Effort normal and breath sounds without rales or wheezing.                Abd:  Soft, NT, ND, + BS, no organomegaly               Neurological: Pt is alert. At baseline orientation, motor grossly intact               Skin: Skin is warm. No rashes, no other new lesions, LE edema - none               Psychiatric: Pt behavior is normal  without agitation   Micro: none  Cardiac tracings I have personally interpreted today:  none  Pertinent Radiological findings (summarize): none   Lab Results  Component Value Date   WBC 12.3 (H) 03/09/2023   HGB 11.2 (L) 03/09/2023   HCT 34.3 (L) 03/09/2023   PLT 271.0 03/09/2023   GLUCOSE 192 (H) 03/09/2023   CHOL 211 (H) 05/14/2022   TRIG 118 05/14/2022   HDL 55 05/14/2022   LDLCALC 135 (H) 05/14/2022   ALT 13 03/09/2023   AST 17 03/09/2023   NA 135 03/09/2023   K 5.1 03/09/2023   CL 89 (L) 03/09/2023   CREATININE 5.05 (HH) 03/09/2023   BUN 44 (H) 03/09/2023   CO2 27 03/09/2023   TSH 3.98 04/16/2020   INR 0.9 03/09/2023   HGBA1C 6.3 (H) 03/10/2022   Assessment/Plan:  [name] is a 72 y.o. White or Caucasian [1] female with  has a past medical history of Anemia, Anxiety, Brain tumor (benign) ([name]), Colitis (2010), Depression, Diabetes mellitus, Dyspnea, ESRD (end stage renal disease) ([name]), Fibromyalgia, GERD (gastroesophageal reflux disease), Headache, History of blood transfusion, Hypertension, IBS (irritable bowel syndrome), LBP (low back pain), Neuropathy, Osteoarthritis, Osteopenia, Pneumonia, Rotator cuff tear, right, and Sinusitis.  Chest pain Sternal post fall - for sternum films, declines pain med for now, also for chest ct no CM r/o sternal fx or ribs given hx of mutiple falls and rib fx in the past yr  Type 2 diabetes mellitus with other diabetic kidney complication ([name]) Lab Results  Component Value Date   HGBA1C 6.3 (H) 03/10/2022   Stable, pt to continue current medical treatment glimeparide 1 mg every day, actos 15 every day, ozempic 2 mg weekly   Essential hypertension BP Readings from Last 3 Encounters:  06/08/23 114/62  06/07/23 (!) 158/73  05/20/23 132/70   Stable, pt to continue medical treatment  - diet , wt control  Followup: Return if symptoms worsen or fail to improve.  Oliver Barre, MD 06/09/2023 1:17 PM [facility]

## 2023-06-08 NOTE — ED Notes (Signed)
NA x4 vitals 

## 2023-06-09 ENCOUNTER — Encounter: Payer: Self-pay | Admitting: Internal Medicine

## 2023-06-09 DIAGNOSIS — Z992 Dependence on renal dialysis: Secondary | ICD-10-CM | POA: Diagnosis not present

## 2023-06-09 DIAGNOSIS — N2581 Secondary hyperparathyroidism of renal origin: Secondary | ICD-10-CM | POA: Diagnosis not present

## 2023-06-09 DIAGNOSIS — N186 End stage renal disease: Secondary | ICD-10-CM | POA: Diagnosis not present

## 2023-06-09 DIAGNOSIS — R519 Headache, unspecified: Secondary | ICD-10-CM | POA: Diagnosis not present

## 2023-06-09 DIAGNOSIS — D631 Anemia in chronic kidney disease: Secondary | ICD-10-CM | POA: Diagnosis not present

## 2023-06-09 DIAGNOSIS — G8929 Other chronic pain: Secondary | ICD-10-CM | POA: Diagnosis not present

## 2023-06-09 DIAGNOSIS — R52 Pain, unspecified: Secondary | ICD-10-CM | POA: Diagnosis not present

## 2023-06-09 NOTE — Assessment & Plan Note (Signed)
Sternal post fall - for sternum films, declines pain med for now, also for chest ct no CM r/o sternal fx or ribs given hx of mutiple falls and rib fx in the past yr

## 2023-06-09 NOTE — Assessment & Plan Note (Signed)
BP Readings from Last 3 Encounters:  06/08/23 114/62  06/07/23 (!) 158/73  05/20/23 132/70   Stable, pt to continue medical treatment  - diet , wt control

## 2023-06-09 NOTE — Assessment & Plan Note (Signed)
Lab Results  Component Value Date   HGBA1C 6.3 (H) 03/10/2022   Stable, pt to continue current medical treatment glimeparide 1 mg every day, actos 15 every day, ozempic 2 mg weekly

## 2023-06-10 ENCOUNTER — Ambulatory Visit
Admission: RE | Admit: 2023-06-10 | Discharge: 2023-06-10 | Disposition: A | Discharge status: Home / Self Care | Payer: Medicare Other | Source: Ambulatory Visit | Attending: Internal Medicine | Admitting: Internal Medicine

## 2023-06-10 DIAGNOSIS — S20219A Contusion of unspecified front wall of thorax, initial encounter: Secondary | ICD-10-CM | POA: Diagnosis not present

## 2023-06-10 DIAGNOSIS — R079 Chest pain, unspecified: Secondary | ICD-10-CM | POA: Diagnosis not present

## 2023-06-10 DIAGNOSIS — I700 Atherosclerosis of aorta: Secondary | ICD-10-CM | POA: Diagnosis not present

## 2023-06-11 DIAGNOSIS — G8929 Other chronic pain: Secondary | ICD-10-CM | POA: Diagnosis not present

## 2023-06-11 DIAGNOSIS — D631 Anemia in chronic kidney disease: Secondary | ICD-10-CM | POA: Diagnosis not present

## 2023-06-11 DIAGNOSIS — N186 End stage renal disease: Secondary | ICD-10-CM | POA: Diagnosis not present

## 2023-06-11 DIAGNOSIS — R52 Pain, unspecified: Secondary | ICD-10-CM | POA: Diagnosis not present

## 2023-06-11 DIAGNOSIS — N2581 Secondary hyperparathyroidism of renal origin: Secondary | ICD-10-CM | POA: Diagnosis not present

## 2023-06-11 DIAGNOSIS — R519 Headache, unspecified: Secondary | ICD-10-CM | POA: Diagnosis not present

## 2023-06-11 DIAGNOSIS — Z992 Dependence on renal dialysis: Secondary | ICD-10-CM | POA: Diagnosis not present

## 2023-06-12 DIAGNOSIS — N186 End stage renal disease: Secondary | ICD-10-CM | POA: Diagnosis not present

## 2023-06-12 DIAGNOSIS — Z992 Dependence on renal dialysis: Secondary | ICD-10-CM | POA: Diagnosis not present

## 2023-06-12 DIAGNOSIS — E1022 Type 1 diabetes mellitus with diabetic chronic kidney disease: Secondary | ICD-10-CM | POA: Diagnosis not present

## 2023-06-13 DIAGNOSIS — R262 Difficulty in walking, not elsewhere classified: Secondary | ICD-10-CM | POA: Diagnosis not present

## 2023-06-13 DIAGNOSIS — M532X7 Spinal instabilities, lumbosacral region: Secondary | ICD-10-CM | POA: Diagnosis not present

## 2023-06-13 DIAGNOSIS — M25652 Stiffness of left hip, not elsewhere classified: Secondary | ICD-10-CM | POA: Diagnosis not present

## 2023-06-13 DIAGNOSIS — M25651 Stiffness of right hip, not elsewhere classified: Secondary | ICD-10-CM | POA: Diagnosis not present

## 2023-06-14 DIAGNOSIS — R519 Headache, unspecified: Secondary | ICD-10-CM | POA: Diagnosis not present

## 2023-06-14 DIAGNOSIS — N2581 Secondary hyperparathyroidism of renal origin: Secondary | ICD-10-CM | POA: Diagnosis not present

## 2023-06-14 DIAGNOSIS — D631 Anemia in chronic kidney disease: Secondary | ICD-10-CM | POA: Diagnosis not present

## 2023-06-14 DIAGNOSIS — R52 Pain, unspecified: Secondary | ICD-10-CM | POA: Diagnosis not present

## 2023-06-14 DIAGNOSIS — N186 End stage renal disease: Secondary | ICD-10-CM | POA: Diagnosis not present

## 2023-06-14 DIAGNOSIS — G8929 Other chronic pain: Secondary | ICD-10-CM | POA: Diagnosis not present

## 2023-06-14 DIAGNOSIS — E1129 Type 2 diabetes mellitus with other diabetic kidney complication: Secondary | ICD-10-CM | POA: Diagnosis not present

## 2023-06-14 DIAGNOSIS — Z992 Dependence on renal dialysis: Secondary | ICD-10-CM | POA: Diagnosis not present

## 2023-06-16 DIAGNOSIS — Z992 Dependence on renal dialysis: Secondary | ICD-10-CM | POA: Diagnosis not present

## 2023-06-16 DIAGNOSIS — R519 Headache, unspecified: Secondary | ICD-10-CM | POA: Diagnosis not present

## 2023-06-16 DIAGNOSIS — E1129 Type 2 diabetes mellitus with other diabetic kidney complication: Secondary | ICD-10-CM | POA: Diagnosis not present

## 2023-06-16 DIAGNOSIS — R52 Pain, unspecified: Secondary | ICD-10-CM | POA: Diagnosis not present

## 2023-06-16 DIAGNOSIS — D631 Anemia in chronic kidney disease: Secondary | ICD-10-CM | POA: Diagnosis not present

## 2023-06-16 DIAGNOSIS — N186 End stage renal disease: Secondary | ICD-10-CM | POA: Diagnosis not present

## 2023-06-16 DIAGNOSIS — N2581 Secondary hyperparathyroidism of renal origin: Secondary | ICD-10-CM | POA: Diagnosis not present

## 2023-06-16 DIAGNOSIS — G8929 Other chronic pain: Secondary | ICD-10-CM | POA: Diagnosis not present

## 2023-06-18 DIAGNOSIS — R519 Headache, unspecified: Secondary | ICD-10-CM | POA: Diagnosis not present

## 2023-06-18 DIAGNOSIS — Z992 Dependence on renal dialysis: Secondary | ICD-10-CM | POA: Diagnosis not present

## 2023-06-18 DIAGNOSIS — E1129 Type 2 diabetes mellitus with other diabetic kidney complication: Secondary | ICD-10-CM | POA: Diagnosis not present

## 2023-06-18 DIAGNOSIS — R52 Pain, unspecified: Secondary | ICD-10-CM | POA: Diagnosis not present

## 2023-06-18 DIAGNOSIS — G8929 Other chronic pain: Secondary | ICD-10-CM | POA: Diagnosis not present

## 2023-06-18 DIAGNOSIS — N186 End stage renal disease: Secondary | ICD-10-CM | POA: Diagnosis not present

## 2023-06-18 DIAGNOSIS — D631 Anemia in chronic kidney disease: Secondary | ICD-10-CM | POA: Diagnosis not present

## 2023-06-18 DIAGNOSIS — N2581 Secondary hyperparathyroidism of renal origin: Secondary | ICD-10-CM | POA: Diagnosis not present

## 2023-06-20 ENCOUNTER — Other Ambulatory Visit (HOSPITAL_COMMUNITY): Payer: Self-pay

## 2023-06-21 DIAGNOSIS — N186 End stage renal disease: Secondary | ICD-10-CM | POA: Diagnosis not present

## 2023-06-21 DIAGNOSIS — Z992 Dependence on renal dialysis: Secondary | ICD-10-CM | POA: Diagnosis not present

## 2023-06-21 DIAGNOSIS — D631 Anemia in chronic kidney disease: Secondary | ICD-10-CM | POA: Diagnosis not present

## 2023-06-21 DIAGNOSIS — N2581 Secondary hyperparathyroidism of renal origin: Secondary | ICD-10-CM | POA: Diagnosis not present

## 2023-06-21 DIAGNOSIS — R519 Headache, unspecified: Secondary | ICD-10-CM | POA: Diagnosis not present

## 2023-06-21 DIAGNOSIS — R52 Pain, unspecified: Secondary | ICD-10-CM | POA: Diagnosis not present

## 2023-06-21 DIAGNOSIS — E1129 Type 2 diabetes mellitus with other diabetic kidney complication: Secondary | ICD-10-CM | POA: Diagnosis not present

## 2023-06-21 DIAGNOSIS — G8929 Other chronic pain: Secondary | ICD-10-CM | POA: Diagnosis not present

## 2023-06-22 DIAGNOSIS — S92352A Displaced fracture of fifth metatarsal bone, left foot, initial encounter for closed fracture: Secondary | ICD-10-CM | POA: Diagnosis not present

## 2023-06-22 DIAGNOSIS — R262 Difficulty in walking, not elsewhere classified: Secondary | ICD-10-CM | POA: Diagnosis not present

## 2023-06-22 DIAGNOSIS — S92505A Nondisplaced unspecified fracture of left lesser toe(s), initial encounter for closed fracture: Secondary | ICD-10-CM | POA: Diagnosis not present

## 2023-06-22 DIAGNOSIS — M25651 Stiffness of right hip, not elsewhere classified: Secondary | ICD-10-CM | POA: Diagnosis not present

## 2023-06-22 DIAGNOSIS — M532X7 Spinal instabilities, lumbosacral region: Secondary | ICD-10-CM | POA: Diagnosis not present

## 2023-06-22 DIAGNOSIS — M79672 Pain in left foot: Secondary | ICD-10-CM | POA: Diagnosis not present

## 2023-06-22 DIAGNOSIS — M25652 Stiffness of left hip, not elsewhere classified: Secondary | ICD-10-CM | POA: Diagnosis not present

## 2023-06-23 DIAGNOSIS — E1129 Type 2 diabetes mellitus with other diabetic kidney complication: Secondary | ICD-10-CM | POA: Diagnosis not present

## 2023-06-23 DIAGNOSIS — Z992 Dependence on renal dialysis: Secondary | ICD-10-CM | POA: Diagnosis not present

## 2023-06-23 DIAGNOSIS — R519 Headache, unspecified: Secondary | ICD-10-CM | POA: Diagnosis not present

## 2023-06-23 DIAGNOSIS — R52 Pain, unspecified: Secondary | ICD-10-CM | POA: Diagnosis not present

## 2023-06-23 DIAGNOSIS — D631 Anemia in chronic kidney disease: Secondary | ICD-10-CM | POA: Diagnosis not present

## 2023-06-23 DIAGNOSIS — G8929 Other chronic pain: Secondary | ICD-10-CM | POA: Diagnosis not present

## 2023-06-23 DIAGNOSIS — N2581 Secondary hyperparathyroidism of renal origin: Secondary | ICD-10-CM | POA: Diagnosis not present

## 2023-06-23 DIAGNOSIS — N186 End stage renal disease: Secondary | ICD-10-CM | POA: Diagnosis not present

## 2023-06-24 DIAGNOSIS — M25652 Stiffness of left hip, not elsewhere classified: Secondary | ICD-10-CM | POA: Diagnosis not present

## 2023-06-24 DIAGNOSIS — R262 Difficulty in walking, not elsewhere classified: Secondary | ICD-10-CM | POA: Diagnosis not present

## 2023-06-24 DIAGNOSIS — M25651 Stiffness of right hip, not elsewhere classified: Secondary | ICD-10-CM | POA: Diagnosis not present

## 2023-06-24 DIAGNOSIS — M532X7 Spinal instabilities, lumbosacral region: Secondary | ICD-10-CM | POA: Diagnosis not present

## 2023-06-25 DIAGNOSIS — R519 Headache, unspecified: Secondary | ICD-10-CM | POA: Diagnosis not present

## 2023-06-25 DIAGNOSIS — N2581 Secondary hyperparathyroidism of renal origin: Secondary | ICD-10-CM | POA: Diagnosis not present

## 2023-06-25 DIAGNOSIS — N186 End stage renal disease: Secondary | ICD-10-CM | POA: Diagnosis not present

## 2023-06-25 DIAGNOSIS — D631 Anemia in chronic kidney disease: Secondary | ICD-10-CM | POA: Diagnosis not present

## 2023-06-25 DIAGNOSIS — R52 Pain, unspecified: Secondary | ICD-10-CM | POA: Diagnosis not present

## 2023-06-25 DIAGNOSIS — E1129 Type 2 diabetes mellitus with other diabetic kidney complication: Secondary | ICD-10-CM | POA: Diagnosis not present

## 2023-06-25 DIAGNOSIS — G8929 Other chronic pain: Secondary | ICD-10-CM | POA: Diagnosis not present

## 2023-06-25 DIAGNOSIS — Z992 Dependence on renal dialysis: Secondary | ICD-10-CM | POA: Diagnosis not present

## 2023-06-26 ENCOUNTER — Other Ambulatory Visit: Payer: Self-pay | Admitting: Internal Medicine

## 2023-06-27 ENCOUNTER — Other Ambulatory Visit (HOSPITAL_COMMUNITY): Payer: Self-pay

## 2023-06-27 ENCOUNTER — Telehealth: Payer: Self-pay | Admitting: Internal Medicine

## 2023-06-27 DIAGNOSIS — R262 Difficulty in walking, not elsewhere classified: Secondary | ICD-10-CM | POA: Diagnosis not present

## 2023-06-27 DIAGNOSIS — M25651 Stiffness of right hip, not elsewhere classified: Secondary | ICD-10-CM | POA: Diagnosis not present

## 2023-06-27 DIAGNOSIS — M25652 Stiffness of left hip, not elsewhere classified: Secondary | ICD-10-CM | POA: Diagnosis not present

## 2023-06-27 DIAGNOSIS — M532X7 Spinal instabilities, lumbosacral region: Secondary | ICD-10-CM | POA: Diagnosis not present

## 2023-06-27 MED ORDER — CYCLOBENZAPRINE HCL 10 MG PO TABS
10.0000 mg | ORAL_TABLET | Freq: Three times a day (TID) | ORAL | 0 refills | Status: DC | PRN
  Filled 2023-06-27: qty 270, 90d supply, fill #0

## 2023-06-27 MED ORDER — PILOCARPINE HCL 5 MG PO TABS
5.0000 mg | ORAL_TABLET | Freq: Two times a day (BID) | ORAL | 0 refills | Status: DC
  Filled 2023-06-27: qty 200, 100d supply, fill #0

## 2023-06-27 NOTE — Telephone Encounter (Signed)
Patient called and said [facility] can't get pilocarpine (SALAGEN) 5 MG tablet. She would like to know if it can be sent to [facility] instead. Best callback is [phone].

## 2023-06-27 NOTE — Telephone Encounter (Signed)
Notified pt rx was resent rx to [facility] pharmacy.Marland KitchenRaechel Chute

## 2023-06-28 DIAGNOSIS — E1129 Type 2 diabetes mellitus with other diabetic kidney complication: Secondary | ICD-10-CM | POA: Diagnosis not present

## 2023-06-28 DIAGNOSIS — N2581 Secondary hyperparathyroidism of renal origin: Secondary | ICD-10-CM | POA: Diagnosis not present

## 2023-06-28 DIAGNOSIS — Z992 Dependence on renal dialysis: Secondary | ICD-10-CM | POA: Diagnosis not present

## 2023-06-28 DIAGNOSIS — G8929 Other chronic pain: Secondary | ICD-10-CM | POA: Diagnosis not present

## 2023-06-28 DIAGNOSIS — D631 Anemia in chronic kidney disease: Secondary | ICD-10-CM | POA: Diagnosis not present

## 2023-06-28 DIAGNOSIS — N186 End stage renal disease: Secondary | ICD-10-CM | POA: Diagnosis not present

## 2023-06-28 DIAGNOSIS — R52 Pain, unspecified: Secondary | ICD-10-CM | POA: Diagnosis not present

## 2023-06-28 DIAGNOSIS — R519 Headache, unspecified: Secondary | ICD-10-CM | POA: Diagnosis not present

## 2023-06-30 DIAGNOSIS — G8929 Other chronic pain: Secondary | ICD-10-CM | POA: Diagnosis not present

## 2023-06-30 DIAGNOSIS — R52 Pain, unspecified: Secondary | ICD-10-CM | POA: Diagnosis not present

## 2023-06-30 DIAGNOSIS — N2581 Secondary hyperparathyroidism of renal origin: Secondary | ICD-10-CM | POA: Diagnosis not present

## 2023-06-30 DIAGNOSIS — N186 End stage renal disease: Secondary | ICD-10-CM | POA: Diagnosis not present

## 2023-06-30 DIAGNOSIS — R519 Headache, unspecified: Secondary | ICD-10-CM | POA: Diagnosis not present

## 2023-06-30 DIAGNOSIS — Z992 Dependence on renal dialysis: Secondary | ICD-10-CM | POA: Diagnosis not present

## 2023-06-30 DIAGNOSIS — D631 Anemia in chronic kidney disease: Secondary | ICD-10-CM | POA: Diagnosis not present

## 2023-06-30 DIAGNOSIS — E1129 Type 2 diabetes mellitus with other diabetic kidney complication: Secondary | ICD-10-CM | POA: Diagnosis not present

## 2023-07-01 DIAGNOSIS — M25651 Stiffness of right hip, not elsewhere classified: Secondary | ICD-10-CM | POA: Diagnosis not present

## 2023-07-01 DIAGNOSIS — M25652 Stiffness of left hip, not elsewhere classified: Secondary | ICD-10-CM | POA: Diagnosis not present

## 2023-07-01 DIAGNOSIS — M532X7 Spinal instabilities, lumbosacral region: Secondary | ICD-10-CM | POA: Diagnosis not present

## 2023-07-01 DIAGNOSIS — R262 Difficulty in walking, not elsewhere classified: Secondary | ICD-10-CM | POA: Diagnosis not present

## 2023-07-02 DIAGNOSIS — D631 Anemia in chronic kidney disease: Secondary | ICD-10-CM | POA: Diagnosis not present

## 2023-07-02 DIAGNOSIS — R52 Pain, unspecified: Secondary | ICD-10-CM | POA: Diagnosis not present

## 2023-07-02 DIAGNOSIS — R519 Headache, unspecified: Secondary | ICD-10-CM | POA: Diagnosis not present

## 2023-07-02 DIAGNOSIS — N186 End stage renal disease: Secondary | ICD-10-CM | POA: Diagnosis not present

## 2023-07-02 DIAGNOSIS — N2581 Secondary hyperparathyroidism of renal origin: Secondary | ICD-10-CM | POA: Diagnosis not present

## 2023-07-02 DIAGNOSIS — E1129 Type 2 diabetes mellitus with other diabetic kidney complication: Secondary | ICD-10-CM | POA: Diagnosis not present

## 2023-07-02 DIAGNOSIS — Z992 Dependence on renal dialysis: Secondary | ICD-10-CM | POA: Diagnosis not present

## 2023-07-02 DIAGNOSIS — G8929 Other chronic pain: Secondary | ICD-10-CM | POA: Diagnosis not present

## 2023-07-04 ENCOUNTER — Encounter: Payer: Self-pay | Admitting: Cardiovascular Disease

## 2023-07-04 ENCOUNTER — Other Ambulatory Visit (HOSPITAL_COMMUNITY): Payer: Self-pay

## 2023-07-04 ENCOUNTER — Ambulatory Visit: Payer: Medicare Other | Attending: Cardiovascular Disease | Admitting: Cardiovascular Disease

## 2023-07-04 ENCOUNTER — Other Ambulatory Visit: Payer: Self-pay

## 2023-07-04 VITALS — BP 122/56 | HR 87 | Ht 62.0 in | Wt 219.0 lb

## 2023-07-04 DIAGNOSIS — E7800 Pure hypercholesterolemia, unspecified: Secondary | ICD-10-CM

## 2023-07-04 DIAGNOSIS — M25652 Stiffness of left hip, not elsewhere classified: Secondary | ICD-10-CM | POA: Diagnosis not present

## 2023-07-04 DIAGNOSIS — G720 Drug-induced myopathy: Secondary | ICD-10-CM | POA: Diagnosis not present

## 2023-07-04 DIAGNOSIS — N186 End stage renal disease: Secondary | ICD-10-CM

## 2023-07-04 DIAGNOSIS — T466X5A Adverse effect of antihyperlipidemic and antiarteriosclerotic drugs, initial encounter: Secondary | ICD-10-CM | POA: Diagnosis not present

## 2023-07-04 DIAGNOSIS — I953 Hypotension of hemodialysis: Secondary | ICD-10-CM

## 2023-07-04 DIAGNOSIS — E1122 Type 2 diabetes mellitus with diabetic chronic kidney disease: Secondary | ICD-10-CM | POA: Diagnosis not present

## 2023-07-04 DIAGNOSIS — R262 Difficulty in walking, not elsewhere classified: Secondary | ICD-10-CM | POA: Diagnosis not present

## 2023-07-04 DIAGNOSIS — I2510 Atherosclerotic heart disease of native coronary artery without angina pectoris: Secondary | ICD-10-CM | POA: Diagnosis not present

## 2023-07-04 DIAGNOSIS — M25651 Stiffness of right hip, not elsewhere classified: Secondary | ICD-10-CM | POA: Diagnosis not present

## 2023-07-04 DIAGNOSIS — I700 Atherosclerosis of aorta: Secondary | ICD-10-CM | POA: Diagnosis not present

## 2023-07-04 DIAGNOSIS — Z992 Dependence on renal dialysis: Secondary | ICD-10-CM

## 2023-07-04 DIAGNOSIS — M532X7 Spinal instabilities, lumbosacral region: Secondary | ICD-10-CM | POA: Diagnosis not present

## 2023-07-04 MED ORDER — REPATHA SURECLICK 140 MG/ML ~~LOC~~ SOAJ: 140.0000 mg | SUBCUTANEOUS | 11 refills | Status: DC

## 2023-07-04 NOTE — Progress Notes (Signed)
Cardiology Office Note:    Date:  07/04/2023   ID:  [name], DOB 12/05/1951, MRN [id number]  PCP:  Tresa Garter, MD   [facility] Providers Cardiologist:  Thurmon Fair, MD     Referring MD: Tresa Garter, MD   Chief Complaint  Patient presents with   Hypotension     History of Present Illness:    [name] is a 72 y.o. female with a hx of end-stage renal disease (per patient report due to prolonged NSAID use for arthritis), type 2 diabetes mellitus also complicated by neuropathy and a variety of joint problems.  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  She continues to have occasional problems with hypotension during hemodialysis, but has not had to actually interrupt hemodialysis.  Often she has to be in Trendelenburg or has to be a brief pause, but she has always been able to complete her dialysis sessions.  She takes midodrine 10 mg before dialysis and another 10 mg dose during hemodialysis.  For the most part she has not had problems with hypotension outside of the dialysis sessions, although just this month she had a dizzy spell in the middle of the night and fractured her toe.  She had another fall in her home, but this was a mechanical trip.  She fell on her left chest and had a hematoma of the left breast and left upper shoulder.  Thankfully no serious injury.  She went to the emergency room after her fall and had a CT of the chest.  6 hours later she has not yet been seen by the provider so she decided to leave the emergency room.  Incidental finding on his CT include the presence of aortic atherosclerosis and coronary calcifications.  She has diabetes mellitus with good glucose control and most recent hemoglobin A1c of 6.3%).  Her most recent lipid profile shows LDL of 135, HDL 55, normal triglycerides.  She does not smoke.  Despite treatment with Actos she has not had any manifestations of heart  failure.  She has had less frequent problems with hypotension during dialysis.  Although sometimes her systolic blood pressure has reportedly been as low as 50 mmHg, she has not had to interrupt hemodialysis.  Sometimes they have to place her in Trendelenburg.  She is wearing compression stockings, an abdominal binder and takes midodrine.  She does not have problems with hypotension between dialysis sessions.  She had dialysis earlier today without major events.  Her blood pressure at this appointment is excellent at 120/72.  Her echocardiogram performed in December 2022 shows excellent findings with normal left ventricular systolic and diastolic function and no major valvular abnormalities. She had another episode of abdominal pain in late March2023.  Findings were suggestive of ischemic colitis.  She spent 3 days in the hospital and had a colonoscopy.  She previously was hospitalized with abdominal pain in August 2022 and was found to have inflammation of the terminal ileum.  She is on hemodialysis 3 days a week on a Tuesday Thursday Saturday schedule.  Her nephrologist is Dr. Marisue Humble.  She is now anuric.  Her dry weight is 96.5 kg.  A random a.m. cortisol checked in August 2022 was 12.9, checked again in March 2023 was 16.6, making adrenal insufficiency less likely.  TSH was normal a year ago.  She is unlikely to be a transplant candidate due to her comorbid conditions and lack of  social assistance/support.  She has not had abdominal surgeries and could be a candidate for peritoneal dialysis, but she does not have enough room in her small home to store all the necessary supplies.  Past Medical History:  Diagnosis Date   Anemia    Anxiety    Brain tumor (benign) ([name])    Colitis 2010   microscopic- Dr Marina Goodell   Depression    Diabetes mellitus    type II   Dyspnea    with exertion   ESRD (end stage renal disease) ([name])    TTUSAT [address]    Fibromyalgia    GERD (gastroesophageal reflux  disease)    Headache    History of blood transfusion    after knee surgery   Hypertension    discontinued all diuretics and antihypertensives   IBS (irritable bowel syndrome)    LBP (low back pain)    Neuropathy    feet bilat    Osteoarthritis    Osteopenia    Pneumonia    hx of 2014    Rotator cuff tear, right    Sinusitis    currently being treated with antibiotic will complete 03/04/2015    Past Surgical History:  Procedure Laterality Date   AV FISTULA PLACEMENT Right 09/12/2020   Procedure: RIGHT ARTERIOVENOUS (AV) FISTULA CREATION;  Surgeon: Sherren Kerns, MD;  Location: [facility] OR;  Service: Vascular;  Laterality: Right;   BASCILIC VEIN TRANSPOSITION Right 10/27/2020   Procedure: RIGHT UPPER EXTREMITY SECOND STAGE BASCILIC VEIN TRANSPOSITION;  Surgeon: Sherren Kerns, MD;  Location: [facility] OR;  Service: Vascular;  Laterality: Right;   BIOPSY  03/11/2022   Procedure: BIOPSY;  Surgeon: Lemar Lofty., MD;  Location[facility] ENDOSCOPY;  Service: Gastroenterology;;   COLONOSCOPY WITH PROPOFOL N/A 03/11/2022   Procedure: COLONOSCOPY WITH PROPOFOL;  Surgeon: Lemar Lofty., MD;  Location: [facility] ENDOSCOPY;  Service: Gastroenterology;  Laterality: N/A;   foramen magnum ependymoma surgery  2003   Dr Jule Ser   JOINT REPLACEMENT Bilateral    NASAL SINUS SURGERY     1973    REVERSE SHOULDER ARTHROPLASTY Right 06/03/2022   Procedure: REVERSE SHOULDER ARTHROPLASTY;  Surgeon: Francena Hanly, MD;  Location: [facility] ORS;  Service: Orthopedics;  Laterality: Right;    TONSILLECTOMY     TOTAL KNEE ARTHROPLASTY     L 2008, R 2009, R 2016- Dr Despina Hick   TOTAL KNEE REVISION Right 03/05/2015   Procedure: RIGHT TOTAL KNEE ARTHROPLASTY REVISION;  Surgeon: Ollen Gross, MD;  Location: [facility] ORS;  Service: Orthopedics;  Laterality: Right;    Current Medications: Current Meds  Medication Sig   Accu-Chek Softclix Lancets lancets Use 1 lancet up to 4 times daily as directed   acetaminophen  (TYLENOL) 500 MG tablet Take 1,000 mg by mouth 2 (two) times daily as needed for headache.   aspirin-acetaminophen-caffeine (EXCEDRIN MIGRAINE) 250-250-65 MG tablet Take 1 tablet by mouth daily as needed for headache or migraine. Max 3 doses in one week   Blood Glucose Monitoring Suppl (ACCU-CHEK GUIDE ME) w/Device KIT Use as directed to check blood sugar four times daily.   buPROPion (WELLBUTRIN XL) 300 MG 24 hr tablet Take 1 tablet (300 mg total) by mouth in the morning.   carboxymethylcellulose (REFRESH PLUS) 0.5 % SOLN Place 1 drop into both eyes 3 (three) times daily as needed (dry eyes/irritation).   cyclobenzaprine (FLEXERIL) 10 MG tablet Take 1 tablet (10 mg total) by mouth 3 (three) times daily as needed for muscle spasms.  Evolocumab (REPATHA SURECLICK) 140 MG/ML SOAJ Inject 140 mg into the skin every 14 (fourteen) days.   ezetimibe (ZETIA) 10 MG tablet TAKE 1 TABLET BY MOUTH DAILY   FLUoxetine (PROZAC) 40 MG capsule Take 1 capsule (40 mg total) by mouth in the morning.   glimepiride (AMARYL) 1 MG tablet TAKE 1 TABLET BY MOUTH DAILY  WITH BREAKFAST   glucose blood (ACCU-CHEK GUIDE) test strip Use to check blood sugar up to four times daily.   lanthanum (FOSRENOL) 1000 MG chewable tablet Chew 1,000 mg by mouth. Taking 2 with meals and 1 with snacks   lidocaine (XYLOCAINE) 5 % ointment Apply 1 application topically 3 (three) times daily as needed for moderate pain.   LORazepam (ATIVAN) 2 MG tablet Take 1 tablet (2 mg total) by mouth 2 (two) times daily as needed for anxiety   midodrine (PROAMATINE) 5 MG tablet Take 1-2 tablet by mouth three times a week as directed Take 30 minutes before dialysis and another tablet during middle of dialysis treatment (Patient taking differently: Take 5 mg by mouth See admin instructions. Take 5 mg by mouth at 5:30 am  and 8:30 am on Tuesday, Thursday and Saturday.)   multivitamin-lutein (OCUVITE-LUTEIN) CAPS capsule Take 1 capsule by mouth every morning.    ondansetron (ZOFRAN) 4 MG tablet Take 1 tablet (4 mg total) by mouth every 8 (eight) hours as needed for nausea or vomiting.   oxyCODONE (ROXICODONE) 15 MG immediate release tablet Take 1 tablet (15 mg total) by mouth 4 (four) times daily as needed.   oxyCODONE (ROXICODONE) 15 MG immediate release tablet Take 1 tablet (15 mg total) by mouth 4 (four) times daily as needed   oxyCODONE (ROXICODONE) 15 MG immediate release tablet Take 1 tablet (15 mg total) by mouth 4 (four) times daily as needed.   pantoprazole (PROTONIX) 40 MG tablet Take 1 tablet (40 mg total) by mouth 2 (two) times daily.   pilocarpine (SALAGEN) 5 MG tablet Take 1 tablet (5 mg total) by mouth 2 (two) times daily.   pioglitazone (ACTOS) 15 MG tablet Take 1 tablet (15 mg total) by mouth daily.   polyethylene glycol (MIRALAX / GLYCOLAX) 17 g packet Take 17 g by mouth every 7 (seven) days.   PREVIDENT 5000 DRY MOUTH 1.1 % GEL dental gel Place 1 application  onto teeth in the morning and at bedtime.   Semaglutide,0.25 or 0.5MG /DOS, (OZEMPIC, 0.25 OR 0.5 MG/DOSE,) 2 MG/3ML SOPN INJECT SUBCUTANEOUSLY  0.5 MG WEEKLY     Allergies:   Erythromycin and Gabapentin   Social History   Socioeconomic History   Marital status: Single    Spouse name: Not on file   Number of children: 0   Years of education: Not on file   Highest education level: Not on file  Occupational History   Not on file  Tobacco Use   Smoking status: Former    Current packs/day: 0.00    Average packs/day: 1 pack/day for 20.0 years (20.0 ttl pk-yrs)    Types: Cigarettes    Start date: 12/13/1968    Quit date: 12/13/1988    Years since quitting: 34.5   Smokeless tobacco: Never  Vaping Use   Vaping status: Never Used  Substance and Sexual Activity   Alcohol use: Not Currently    Alcohol/week: 0.0 standard drinks of alcohol    Comment: rarely   Drug use: No   Sexual activity: Not Currently  Other Topics Concern   Not on file  Social  History Narrative   Not on  file   Social Determinants of Health   Financial Resource Strain: Low Risk  (08/02/2022)   Overall Financial Resource Strain (CARDIA)    Difficulty of Paying Living Expenses: Not hard at all  Food Insecurity: No Food Insecurity (03/10/2023)   Hunger Vital Sign    Worried About Running Out of Food in the Last Year: Never true    Ran Out of Food in the Last Year: Never true  Transportation Needs: No Transportation Needs (03/10/2023)   PRAPARE - Administrator, Civil Service (Medical): No    Lack of Transportation (Non-Medical): No  Physical Activity: Inactive (08/02/2022)   Exercise Vital Sign    Days of Exercise per Week: 0 days    Minutes of Exercise per Session: 0 min  Stress: No Stress Concern Present (08/02/2022)   [employer] of Occupational Health - Occupational Stress Questionnaire    Feeling of Stress : Not at all  Social Connections: Unknown (08/02/2022)   Social Connection and Isolation Panel [NHANES]    Frequency of Communication with Friends and Family: More than three times a week    Frequency of Social Gatherings with Friends and Family: More than three times a week    Attends Religious Services: Patient declined    Database administrator or Organizations: Patient declined    Attends Engineer, structural: Patient declined    Marital Status: Never married     Family History: The patient's family history includes Cancer in her brother; Crohn's disease in her maternal uncle; Depression in her mother; Diabetes in her brother, brother, father, mother, and another family member; Heart attack in her brother; Heart disease in her brother and brother; Hyperlipidemia in her brother, brother, and father; Hypertension in her brother, brother, father, and mother; Stroke in her brother; Stroke (age of onset: 27) in her mother. There is no history of Colon cancer, Esophageal cancer, Stomach cancer, Pancreatic cancer, or Breast cancer.  ROS:   Please see the  history of present illness.     All other systems reviewed and are negative.  EKGs/Labs/Other Studies Reviewed:    The following studies were reviewed today: Echocardiogram 12/09/2021  1. Left ventricular ejection fraction, by estimation, is 60 to 65%. The  left ventricle has normal function. The left ventricle has no regional  wall motion abnormalities. Left ventricular diastolic parameters were  normal.   2. Right ventricular systolic function is normal. The right ventricular  size is normal.   3. The mitral valve is normal in structure. No evidence of mitral valve  regurgitation. No evidence of mitral stenosis.   4. The aortic valve is normal in structure. Aortic valve regurgitation is  not visualized. Aortic valve sclerosis is present, with no evidence of  aortic valve stenosis.   5. The inferior vena cava is normal in size with greater than 50%  respiratory variability, suggesting right atrial pressure of 3 mmHg.   EKG:  EKG is  ordered today.  Personally reviewed, it is a completely normal tracing (normal sinus rhythm) except for mildly prolonged QTc at 490 ms.  Had dialysis earlier today.  Recent Labs: 03/09/2023: ALT 13; BUN 44; Creatinine, Ser 5.05; Hemoglobin 11.2; Platelets 271.0; Potassium 5.1; Sodium 135  Recent Lipid Panel    Component Value Date/Time   CHOL 211 (H) 05/14/2022 1024   TRIG 118 05/14/2022 1024   HDL 55 05/14/2022 1024   CHOLHDL 3.8 05/14/2022 1024   CHOLHDL 5  09/25/2014 1601   VLDL 30.8 09/25/2014 1601   LDLCALC 135 (H) 05/14/2022 1024     Risk Assessment/Calculations:           Physical Exam:    VS:  BP (!) 122/56 (BP Location: Left Arm, Patient Position: Sitting, Cuff Size: Large)   Pulse 87   Ht 5\' 2"  (1.575 m)   Wt 219 lb (99.3 kg)   SpO2 95%   BMI 40.06 kg/m     Wt Readings from Last 3 Encounters:  07/04/23 219 lb (99.3 kg)  06/08/23 213 lb (96.6 kg)  05/20/23 213 lb (96.6 kg)      General: Alert, oriented x3, no distress,  borderline morbid obesity. Head: no evidence of trauma, PERRL, EOMI, no exophtalmos or lid lag, no myxedema, no xanthelasma; normal ears, nose and oropharynx Neck: normal jugular venous pulsations and no hepatojugular reflux; brisk carotid pulses without delay and no carotid bruits Chest: clear to auscultation, no signs of consolidation by percussion or palpation, normal fremitus, symmetrical and full respiratory excursions.  Resolving hematoma right upper chest and right shoulder Cardiovascular: Healthy appearing right upper arm AV fistula, with excellent thrill and loud bruit that radiates broadly towards the upper chest and neck, normal position and quality of the apical impulse, regular rhythm, normal first and second heart sounds, no murmurs, rubs or gallops Abdomen: no tenderness or distention, no masses by palpation, no abnormal pulsatility or arterial bruits, normal bowel sounds, no hepatosplenomegaly Extremities: no clubbing, cyanosis or edema; 2+ radial, ulnar and brachial pulses bilaterally; 2+ right femoral, posterior tibial and dorsalis pedis pulses; 2+ left femoral, posterior tibial and dorsalis pedis pulses; no subclavian or femoral bruits Neurological: grossly nonfocal Psych: Normal mood and affect   ASSESSMENT:    1. Hemodialysis-associated hypotension   2. ESRD (end stage renal disease) on dialysis ([name])   3. Atherosclerosis of aorta ([name])   4. Controlled type 2 diabetes mellitus with chronic kidney disease on chronic dialysis, without long-term current use of insulin ([name])   5. Coronary artery calcification seen on CT scan   6. Pure hypercholesterolemia   7. Statin myopathy     PLAN:    In order of problems listed above:  Dialysis related hypotension: Her falls have mostly mechanical falls and she has been able to tolerate hemodialysis without interruption while using midodrine.  Check not a lot of other solutions other than the continued use of midodrine, compression  stockings and abdominal binder.  The dose of midodrine can be increased to 20 mg daily if necessary, although this is a higher dose than typically recommended.  Echocardiogram does not show LVH and does not suggest amyloidosis.  Fludrocortisone 0.2 mg administered 30 minutes before dialysis has been reportedly helpful in patients with intradialytic hypotension DM: Very well controlled with very low doses of medication.  Only other diabetic complication is neuropathy. Aortic and coronary atherosclerosis: Despite reported episode of ischemic colitis, she does not have evidence of significant atherosclerotic disease in the mesenteric branches.  She does have significant atherosclerosis in the abdominal aorta, but this appears to be nonobstructive and the aorta is normal in caliber. HLP: Mild.  Calculated LDL 135, HDL is 55.  Statin intolerant in the past due to myopathy.  Will try to initiate treatment with Repatha problem.  Repeat labs today.           Medication Adjustments/Labs and Tests Ordered: Current medicines are reviewed at length with the patient today.  Concerns regarding medicines are outlined  above.  Orders Placed This Encounter  Procedures   Lipid panel   EKG 12-Lead   Meds ordered this encounter  Medications   Evolocumab (REPATHA SURECLICK) 140 MG/ML SOAJ    Sig: Inject 140 mg into the skin every 14 (fourteen) days.    Dispense:  2 mL    Refill:  11     Patient Instructions  Medication Instructions:  Your physician has recommended you make the following change in your medication:  1) START taking Repatha 140 mg/mL once every 2 weeks. *If you need a refill on your cardiac medications before your next appointment, please call your pharmacy*  Lab Work: TODAY: Lipids If you have labs (blood work) drawn today and your tests are completely normal, you will receive your results only by: MyChart Message (if you have MyChart) OR A paper copy in the mail If you have any lab  test that is abnormal or we need to change your treatment, we will call you to review the results.  Follow-Up: At [facility], you and your health needs are our priority.  As part of our continuing mission to provide you with exceptional heart care, we have created designated Provider Care Teams.  These Care Teams include your primary Cardiologist (physician) and Advanced Practice Providers (APPs -  Physician Assistants and Nurse Practitioners) who all work together to provide you with the care you need, when you need it.  Your next appointment:   1 year  Provider:   Thurmon Fair, MD          Signed, Thurmon Fair, MD  07/04/2023 9:22 AM    [facility]

## 2023-07-04 NOTE — Patient Instructions (Signed)
Medication Instructions:  Your physician has recommended you make the following change in your medication:  1) START taking Repatha 140 mg/mL once every 2 weeks. *If you need a refill on your cardiac medications before your next appointment, please call your pharmacy*  Lab Work: TODAY: Lipids If you have labs (blood work) drawn today and your tests are completely normal, you will receive your results only by: MyChart Message (if you have MyChart) OR A paper copy in the mail If you have any lab test that is abnormal or we need to change your treatment, we will call you to review the results.  Follow-Up: At [facility], you and your health needs are our priority.  As part of our continuing mission to provide you with exceptional heart care, we have created designated Provider Care Teams.  These Care Teams include your primary Cardiologist (physician) and Advanced Practice Providers (APPs -  Physician Assistants and Nurse Practitioners) who all work together to provide you with the care you need, when you need it.  Your next appointment:   1 year  Provider:   Thurmon Fair, MD

## 2023-07-05 ENCOUNTER — Telehealth: Payer: Self-pay

## 2023-07-05 ENCOUNTER — Other Ambulatory Visit (HOSPITAL_COMMUNITY): Payer: Self-pay

## 2023-07-05 DIAGNOSIS — G8929 Other chronic pain: Secondary | ICD-10-CM | POA: Diagnosis not present

## 2023-07-05 DIAGNOSIS — N186 End stage renal disease: Secondary | ICD-10-CM | POA: Diagnosis not present

## 2023-07-05 DIAGNOSIS — D631 Anemia in chronic kidney disease: Secondary | ICD-10-CM | POA: Diagnosis not present

## 2023-07-05 DIAGNOSIS — N2581 Secondary hyperparathyroidism of renal origin: Secondary | ICD-10-CM | POA: Diagnosis not present

## 2023-07-05 DIAGNOSIS — E1129 Type 2 diabetes mellitus with other diabetic kidney complication: Secondary | ICD-10-CM | POA: Diagnosis not present

## 2023-07-05 DIAGNOSIS — Z992 Dependence on renal dialysis: Secondary | ICD-10-CM | POA: Diagnosis not present

## 2023-07-05 DIAGNOSIS — R52 Pain, unspecified: Secondary | ICD-10-CM | POA: Diagnosis not present

## 2023-07-05 DIAGNOSIS — R519 Headache, unspecified: Secondary | ICD-10-CM | POA: Diagnosis not present

## 2023-07-05 LAB — LIPID PANEL
Chol/HDL Ratio: 4.1 ratio (ref 0.0–4.4)
Cholesterol, Total: 223 mg/dL — ABNORMAL HIGH (ref 100–199)
HDL: 54 mg/dL (ref >39)
LDL Chol Calc (NIH): 147 mg/dL — ABNORMAL HIGH (ref 0–99)
Triglycerides: 124 mg/dL (ref 0–149)
VLDL Cholesterol Cal: 22 mg/dL (ref 5–40)

## 2023-07-05 NOTE — Telephone Encounter (Signed)
    Pharmacy Patient Advocate Encounter  Received notification from [facility] that Prior Authorization for REPATHA has been APPROVED to 01/05/2024.   ZOX:[id number]

## 2023-07-06 ENCOUNTER — Other Ambulatory Visit (HOSPITAL_COMMUNITY): Payer: Self-pay

## 2023-07-06 DIAGNOSIS — M25651 Stiffness of right hip, not elsewhere classified: Secondary | ICD-10-CM | POA: Diagnosis not present

## 2023-07-06 DIAGNOSIS — M25652 Stiffness of left hip, not elsewhere classified: Secondary | ICD-10-CM | POA: Diagnosis not present

## 2023-07-06 DIAGNOSIS — R262 Difficulty in walking, not elsewhere classified: Secondary | ICD-10-CM | POA: Diagnosis not present

## 2023-07-06 DIAGNOSIS — M532X7 Spinal instabilities, lumbosacral region: Secondary | ICD-10-CM | POA: Diagnosis not present

## 2023-07-06 MED ORDER — OXYCODONE HCL 15 MG PO TABS
15.0000 mg | ORAL_TABLET | Freq: Four times a day (QID) | ORAL | 0 refills | Status: DC | PRN
Start: 2023-07-06 — End: 2023-08-03
  Filled 2023-07-06: qty 120, 30d supply, fill #0

## 2023-07-07 ENCOUNTER — Other Ambulatory Visit (HOSPITAL_COMMUNITY): Payer: Self-pay

## 2023-07-07 DIAGNOSIS — G8929 Other chronic pain: Secondary | ICD-10-CM | POA: Diagnosis not present

## 2023-07-07 DIAGNOSIS — N2581 Secondary hyperparathyroidism of renal origin: Secondary | ICD-10-CM | POA: Diagnosis not present

## 2023-07-07 DIAGNOSIS — Z992 Dependence on renal dialysis: Secondary | ICD-10-CM | POA: Diagnosis not present

## 2023-07-07 DIAGNOSIS — R52 Pain, unspecified: Secondary | ICD-10-CM | POA: Diagnosis not present

## 2023-07-07 DIAGNOSIS — D631 Anemia in chronic kidney disease: Secondary | ICD-10-CM | POA: Diagnosis not present

## 2023-07-07 DIAGNOSIS — R519 Headache, unspecified: Secondary | ICD-10-CM | POA: Diagnosis not present

## 2023-07-07 DIAGNOSIS — E1129 Type 2 diabetes mellitus with other diabetic kidney complication: Secondary | ICD-10-CM | POA: Diagnosis not present

## 2023-07-07 DIAGNOSIS — N186 End stage renal disease: Secondary | ICD-10-CM | POA: Diagnosis not present

## 2023-07-07 NOTE — Telephone Encounter (Signed)
Patient made aware that Repatha has been approved. Advised to call her pharmacy to make sure they filled and call us if there is an issue with the cost.

## 2023-07-08 ENCOUNTER — Other Ambulatory Visit (HOSPITAL_COMMUNITY): Payer: Self-pay

## 2023-07-08 DIAGNOSIS — R262 Difficulty in walking, not elsewhere classified: Secondary | ICD-10-CM | POA: Diagnosis not present

## 2023-07-08 DIAGNOSIS — M25651 Stiffness of right hip, not elsewhere classified: Secondary | ICD-10-CM | POA: Diagnosis not present

## 2023-07-08 DIAGNOSIS — M532X7 Spinal instabilities, lumbosacral region: Secondary | ICD-10-CM | POA: Diagnosis not present

## 2023-07-08 DIAGNOSIS — M25652 Stiffness of left hip, not elsewhere classified: Secondary | ICD-10-CM | POA: Diagnosis not present

## 2023-07-09 DIAGNOSIS — N2581 Secondary hyperparathyroidism of renal origin: Secondary | ICD-10-CM | POA: Diagnosis not present

## 2023-07-09 DIAGNOSIS — R519 Headache, unspecified: Secondary | ICD-10-CM | POA: Diagnosis not present

## 2023-07-09 DIAGNOSIS — E1129 Type 2 diabetes mellitus with other diabetic kidney complication: Secondary | ICD-10-CM | POA: Diagnosis not present

## 2023-07-09 DIAGNOSIS — Z992 Dependence on renal dialysis: Secondary | ICD-10-CM | POA: Diagnosis not present

## 2023-07-09 DIAGNOSIS — D631 Anemia in chronic kidney disease: Secondary | ICD-10-CM | POA: Diagnosis not present

## 2023-07-09 DIAGNOSIS — N186 End stage renal disease: Secondary | ICD-10-CM | POA: Diagnosis not present

## 2023-07-09 DIAGNOSIS — R52 Pain, unspecified: Secondary | ICD-10-CM | POA: Diagnosis not present

## 2023-07-09 DIAGNOSIS — G8929 Other chronic pain: Secondary | ICD-10-CM | POA: Diagnosis not present

## 2023-07-11 DIAGNOSIS — M532X7 Spinal instabilities, lumbosacral region: Secondary | ICD-10-CM | POA: Diagnosis not present

## 2023-07-11 DIAGNOSIS — M79672 Pain in left foot: Secondary | ICD-10-CM | POA: Diagnosis not present

## 2023-07-11 DIAGNOSIS — R262 Difficulty in walking, not elsewhere classified: Secondary | ICD-10-CM | POA: Diagnosis not present

## 2023-07-11 DIAGNOSIS — S92352D Displaced fracture of fifth metatarsal bone, left foot, subsequent encounter for fracture with routine healing: Secondary | ICD-10-CM | POA: Diagnosis not present

## 2023-07-11 DIAGNOSIS — M25651 Stiffness of right hip, not elsewhere classified: Secondary | ICD-10-CM | POA: Diagnosis not present

## 2023-07-11 DIAGNOSIS — S92515D Nondisplaced fracture of proximal phalanx of left lesser toe(s), subsequent encounter for fracture with routine healing: Secondary | ICD-10-CM | POA: Diagnosis not present

## 2023-07-11 DIAGNOSIS — M25652 Stiffness of left hip, not elsewhere classified: Secondary | ICD-10-CM | POA: Diagnosis not present

## 2023-07-12 DIAGNOSIS — E1129 Type 2 diabetes mellitus with other diabetic kidney complication: Secondary | ICD-10-CM | POA: Diagnosis not present

## 2023-07-12 DIAGNOSIS — N2581 Secondary hyperparathyroidism of renal origin: Secondary | ICD-10-CM | POA: Diagnosis not present

## 2023-07-12 DIAGNOSIS — R52 Pain, unspecified: Secondary | ICD-10-CM | POA: Diagnosis not present

## 2023-07-12 DIAGNOSIS — Z992 Dependence on renal dialysis: Secondary | ICD-10-CM | POA: Diagnosis not present

## 2023-07-12 DIAGNOSIS — N186 End stage renal disease: Secondary | ICD-10-CM | POA: Diagnosis not present

## 2023-07-12 DIAGNOSIS — G8929 Other chronic pain: Secondary | ICD-10-CM | POA: Diagnosis not present

## 2023-07-12 DIAGNOSIS — R519 Headache, unspecified: Secondary | ICD-10-CM | POA: Diagnosis not present

## 2023-07-12 DIAGNOSIS — D631 Anemia in chronic kidney disease: Secondary | ICD-10-CM | POA: Diagnosis not present

## 2023-07-13 DIAGNOSIS — N186 End stage renal disease: Secondary | ICD-10-CM | POA: Diagnosis not present

## 2023-07-13 DIAGNOSIS — Z992 Dependence on renal dialysis: Secondary | ICD-10-CM | POA: Diagnosis not present

## 2023-07-13 DIAGNOSIS — E1022 Type 1 diabetes mellitus with diabetic chronic kidney disease: Secondary | ICD-10-CM | POA: Diagnosis not present

## 2023-07-14 DIAGNOSIS — D631 Anemia in chronic kidney disease: Secondary | ICD-10-CM | POA: Diagnosis not present

## 2023-07-14 DIAGNOSIS — Z992 Dependence on renal dialysis: Secondary | ICD-10-CM | POA: Diagnosis not present

## 2023-07-14 DIAGNOSIS — N2581 Secondary hyperparathyroidism of renal origin: Secondary | ICD-10-CM | POA: Diagnosis not present

## 2023-07-14 DIAGNOSIS — R52 Pain, unspecified: Secondary | ICD-10-CM | POA: Diagnosis not present

## 2023-07-14 DIAGNOSIS — R519 Headache, unspecified: Secondary | ICD-10-CM | POA: Diagnosis not present

## 2023-07-14 DIAGNOSIS — N186 End stage renal disease: Secondary | ICD-10-CM | POA: Diagnosis not present

## 2023-07-16 DIAGNOSIS — N2581 Secondary hyperparathyroidism of renal origin: Secondary | ICD-10-CM | POA: Diagnosis not present

## 2023-07-16 DIAGNOSIS — D631 Anemia in chronic kidney disease: Secondary | ICD-10-CM | POA: Diagnosis not present

## 2023-07-16 DIAGNOSIS — Z992 Dependence on renal dialysis: Secondary | ICD-10-CM | POA: Diagnosis not present

## 2023-07-16 DIAGNOSIS — N186 End stage renal disease: Secondary | ICD-10-CM | POA: Diagnosis not present

## 2023-07-16 DIAGNOSIS — R52 Pain, unspecified: Secondary | ICD-10-CM | POA: Diagnosis not present

## 2023-07-16 DIAGNOSIS — R519 Headache, unspecified: Secondary | ICD-10-CM | POA: Diagnosis not present

## 2023-07-18 ENCOUNTER — Ambulatory Visit
Admission: RE | Admit: 2023-07-18 | Discharge: 2023-07-18 | Disposition: A | Discharge status: Home / Self Care | Payer: Medicare Other | Source: Ambulatory Visit | Attending: Obstetrics and Gynecology | Admitting: Obstetrics and Gynecology

## 2023-07-18 ENCOUNTER — Other Ambulatory Visit: Payer: Self-pay | Admitting: Obstetrics and Gynecology

## 2023-07-18 DIAGNOSIS — N641 Fat necrosis of breast: Secondary | ICD-10-CM | POA: Diagnosis not present

## 2023-07-18 DIAGNOSIS — N6310 Unspecified lump in the right breast, unspecified quadrant: Secondary | ICD-10-CM

## 2023-07-18 DIAGNOSIS — N6311 Unspecified lump in the right breast, upper outer quadrant: Secondary | ICD-10-CM | POA: Diagnosis not present

## 2023-07-19 DIAGNOSIS — Z992 Dependence on renal dialysis: Secondary | ICD-10-CM | POA: Diagnosis not present

## 2023-07-19 DIAGNOSIS — R52 Pain, unspecified: Secondary | ICD-10-CM | POA: Diagnosis not present

## 2023-07-19 DIAGNOSIS — N186 End stage renal disease: Secondary | ICD-10-CM | POA: Diagnosis not present

## 2023-07-19 DIAGNOSIS — D631 Anemia in chronic kidney disease: Secondary | ICD-10-CM | POA: Diagnosis not present

## 2023-07-19 DIAGNOSIS — R519 Headache, unspecified: Secondary | ICD-10-CM | POA: Diagnosis not present

## 2023-07-19 DIAGNOSIS — N2581 Secondary hyperparathyroidism of renal origin: Secondary | ICD-10-CM | POA: Diagnosis not present

## 2023-07-20 NOTE — Telephone Encounter (Signed)
Patient is calling back, saying she having a issues with trying to get this medication. Would like to get a call back to ger this resolve. Please advise

## 2023-07-20 NOTE — Telephone Encounter (Signed)
Patient is aware repatha PA has been approved. She will call her pharmacy.

## 2023-07-21 DIAGNOSIS — D631 Anemia in chronic kidney disease: Secondary | ICD-10-CM | POA: Diagnosis not present

## 2023-07-21 DIAGNOSIS — N186 End stage renal disease: Secondary | ICD-10-CM | POA: Diagnosis not present

## 2023-07-21 DIAGNOSIS — R52 Pain, unspecified: Secondary | ICD-10-CM | POA: Diagnosis not present

## 2023-07-21 DIAGNOSIS — N2581 Secondary hyperparathyroidism of renal origin: Secondary | ICD-10-CM | POA: Diagnosis not present

## 2023-07-21 DIAGNOSIS — R519 Headache, unspecified: Secondary | ICD-10-CM | POA: Diagnosis not present

## 2023-07-21 DIAGNOSIS — Z992 Dependence on renal dialysis: Secondary | ICD-10-CM | POA: Diagnosis not present

## 2023-07-23 DIAGNOSIS — Z992 Dependence on renal dialysis: Secondary | ICD-10-CM | POA: Diagnosis not present

## 2023-07-23 DIAGNOSIS — N186 End stage renal disease: Secondary | ICD-10-CM | POA: Diagnosis not present

## 2023-07-23 DIAGNOSIS — R519 Headache, unspecified: Secondary | ICD-10-CM | POA: Diagnosis not present

## 2023-07-23 DIAGNOSIS — D631 Anemia in chronic kidney disease: Secondary | ICD-10-CM | POA: Diagnosis not present

## 2023-07-23 DIAGNOSIS — R52 Pain, unspecified: Secondary | ICD-10-CM | POA: Diagnosis not present

## 2023-07-23 DIAGNOSIS — N2581 Secondary hyperparathyroidism of renal origin: Secondary | ICD-10-CM | POA: Diagnosis not present

## 2023-07-26 DIAGNOSIS — N186 End stage renal disease: Secondary | ICD-10-CM | POA: Diagnosis not present

## 2023-07-26 DIAGNOSIS — Z992 Dependence on renal dialysis: Secondary | ICD-10-CM | POA: Diagnosis not present

## 2023-07-26 DIAGNOSIS — D631 Anemia in chronic kidney disease: Secondary | ICD-10-CM | POA: Diagnosis not present

## 2023-07-26 DIAGNOSIS — N2581 Secondary hyperparathyroidism of renal origin: Secondary | ICD-10-CM | POA: Diagnosis not present

## 2023-07-26 DIAGNOSIS — R519 Headache, unspecified: Secondary | ICD-10-CM | POA: Diagnosis not present

## 2023-07-26 DIAGNOSIS — R52 Pain, unspecified: Secondary | ICD-10-CM | POA: Diagnosis not present

## 2023-07-28 DIAGNOSIS — R519 Headache, unspecified: Secondary | ICD-10-CM | POA: Diagnosis not present

## 2023-07-28 DIAGNOSIS — N186 End stage renal disease: Secondary | ICD-10-CM | POA: Diagnosis not present

## 2023-07-28 DIAGNOSIS — N2581 Secondary hyperparathyroidism of renal origin: Secondary | ICD-10-CM | POA: Diagnosis not present

## 2023-07-28 DIAGNOSIS — D631 Anemia in chronic kidney disease: Secondary | ICD-10-CM | POA: Diagnosis not present

## 2023-07-28 DIAGNOSIS — Z992 Dependence on renal dialysis: Secondary | ICD-10-CM | POA: Diagnosis not present

## 2023-07-28 DIAGNOSIS — R52 Pain, unspecified: Secondary | ICD-10-CM | POA: Diagnosis not present

## 2023-07-30 DIAGNOSIS — R52 Pain, unspecified: Secondary | ICD-10-CM | POA: Diagnosis not present

## 2023-07-30 DIAGNOSIS — N2581 Secondary hyperparathyroidism of renal origin: Secondary | ICD-10-CM | POA: Diagnosis not present

## 2023-07-30 DIAGNOSIS — N186 End stage renal disease: Secondary | ICD-10-CM | POA: Diagnosis not present

## 2023-07-30 DIAGNOSIS — D631 Anemia in chronic kidney disease: Secondary | ICD-10-CM | POA: Diagnosis not present

## 2023-07-30 DIAGNOSIS — Z992 Dependence on renal dialysis: Secondary | ICD-10-CM | POA: Diagnosis not present

## 2023-07-30 DIAGNOSIS — R519 Headache, unspecified: Secondary | ICD-10-CM | POA: Diagnosis not present

## 2023-08-02 DIAGNOSIS — Z992 Dependence on renal dialysis: Secondary | ICD-10-CM | POA: Diagnosis not present

## 2023-08-02 DIAGNOSIS — N186 End stage renal disease: Secondary | ICD-10-CM | POA: Diagnosis not present

## 2023-08-02 DIAGNOSIS — N2581 Secondary hyperparathyroidism of renal origin: Secondary | ICD-10-CM | POA: Diagnosis not present

## 2023-08-02 DIAGNOSIS — R52 Pain, unspecified: Secondary | ICD-10-CM | POA: Diagnosis not present

## 2023-08-02 DIAGNOSIS — R519 Headache, unspecified: Secondary | ICD-10-CM | POA: Diagnosis not present

## 2023-08-02 DIAGNOSIS — D631 Anemia in chronic kidney disease: Secondary | ICD-10-CM | POA: Diagnosis not present

## 2023-08-03 ENCOUNTER — Other Ambulatory Visit (HOSPITAL_COMMUNITY): Payer: Self-pay

## 2023-08-03 ENCOUNTER — Other Ambulatory Visit: Payer: Self-pay

## 2023-08-03 MED ORDER — OXYCODONE HCL 15 MG PO TABS
15.0000 mg | ORAL_TABLET | Freq: Four times a day (QID) | ORAL | 0 refills | Status: DC | PRN
  Filled 2023-08-05: qty 120, 30d supply, fill #0

## 2023-08-04 DIAGNOSIS — D631 Anemia in chronic kidney disease: Secondary | ICD-10-CM | POA: Diagnosis not present

## 2023-08-04 DIAGNOSIS — Z992 Dependence on renal dialysis: Secondary | ICD-10-CM | POA: Diagnosis not present

## 2023-08-04 DIAGNOSIS — R519 Headache, unspecified: Secondary | ICD-10-CM | POA: Diagnosis not present

## 2023-08-04 DIAGNOSIS — R52 Pain, unspecified: Secondary | ICD-10-CM | POA: Diagnosis not present

## 2023-08-04 DIAGNOSIS — N186 End stage renal disease: Secondary | ICD-10-CM | POA: Diagnosis not present

## 2023-08-04 DIAGNOSIS — N2581 Secondary hyperparathyroidism of renal origin: Secondary | ICD-10-CM | POA: Diagnosis not present

## 2023-08-05 ENCOUNTER — Other Ambulatory Visit (HOSPITAL_COMMUNITY): Payer: Self-pay

## 2023-08-05 ENCOUNTER — Other Ambulatory Visit: Payer: Self-pay

## 2023-08-06 DIAGNOSIS — D631 Anemia in chronic kidney disease: Secondary | ICD-10-CM | POA: Diagnosis not present

## 2023-08-06 DIAGNOSIS — N2581 Secondary hyperparathyroidism of renal origin: Secondary | ICD-10-CM | POA: Diagnosis not present

## 2023-08-06 DIAGNOSIS — R519 Headache, unspecified: Secondary | ICD-10-CM | POA: Diagnosis not present

## 2023-08-06 DIAGNOSIS — Z992 Dependence on renal dialysis: Secondary | ICD-10-CM | POA: Diagnosis not present

## 2023-08-06 DIAGNOSIS — R52 Pain, unspecified: Secondary | ICD-10-CM | POA: Diagnosis not present

## 2023-08-06 DIAGNOSIS — N186 End stage renal disease: Secondary | ICD-10-CM | POA: Diagnosis not present

## 2023-08-07 ENCOUNTER — Other Ambulatory Visit: Payer: Self-pay | Admitting: Internal Medicine

## 2023-08-08 DIAGNOSIS — S92352D Displaced fracture of fifth metatarsal bone, left foot, subsequent encounter for fracture with routine healing: Secondary | ICD-10-CM | POA: Diagnosis not present

## 2023-08-08 DIAGNOSIS — S92515D Nondisplaced fracture of proximal phalanx of left lesser toe(s), subsequent encounter for fracture with routine healing: Secondary | ICD-10-CM | POA: Diagnosis not present

## 2023-08-09 DIAGNOSIS — N2581 Secondary hyperparathyroidism of renal origin: Secondary | ICD-10-CM | POA: Diagnosis not present

## 2023-08-09 DIAGNOSIS — D631 Anemia in chronic kidney disease: Secondary | ICD-10-CM | POA: Diagnosis not present

## 2023-08-09 DIAGNOSIS — Z992 Dependence on renal dialysis: Secondary | ICD-10-CM | POA: Diagnosis not present

## 2023-08-09 DIAGNOSIS — R52 Pain, unspecified: Secondary | ICD-10-CM | POA: Diagnosis not present

## 2023-08-09 DIAGNOSIS — R519 Headache, unspecified: Secondary | ICD-10-CM | POA: Diagnosis not present

## 2023-08-09 DIAGNOSIS — N186 End stage renal disease: Secondary | ICD-10-CM | POA: Diagnosis not present

## 2023-08-11 DIAGNOSIS — N2581 Secondary hyperparathyroidism of renal origin: Secondary | ICD-10-CM | POA: Diagnosis not present

## 2023-08-11 DIAGNOSIS — R52 Pain, unspecified: Secondary | ICD-10-CM | POA: Diagnosis not present

## 2023-08-11 DIAGNOSIS — N186 End stage renal disease: Secondary | ICD-10-CM | POA: Diagnosis not present

## 2023-08-11 DIAGNOSIS — D631 Anemia in chronic kidney disease: Secondary | ICD-10-CM | POA: Diagnosis not present

## 2023-08-11 DIAGNOSIS — Z992 Dependence on renal dialysis: Secondary | ICD-10-CM | POA: Diagnosis not present

## 2023-08-11 DIAGNOSIS — R519 Headache, unspecified: Secondary | ICD-10-CM | POA: Diagnosis not present

## 2023-08-13 DIAGNOSIS — Z992 Dependence on renal dialysis: Secondary | ICD-10-CM | POA: Diagnosis not present

## 2023-08-13 DIAGNOSIS — R519 Headache, unspecified: Secondary | ICD-10-CM | POA: Diagnosis not present

## 2023-08-13 DIAGNOSIS — D631 Anemia in chronic kidney disease: Secondary | ICD-10-CM | POA: Diagnosis not present

## 2023-08-13 DIAGNOSIS — R52 Pain, unspecified: Secondary | ICD-10-CM | POA: Diagnosis not present

## 2023-08-13 DIAGNOSIS — N2581 Secondary hyperparathyroidism of renal origin: Secondary | ICD-10-CM | POA: Diagnosis not present

## 2023-08-13 DIAGNOSIS — E1022 Type 1 diabetes mellitus with diabetic chronic kidney disease: Secondary | ICD-10-CM | POA: Diagnosis not present

## 2023-08-13 DIAGNOSIS — N186 End stage renal disease: Secondary | ICD-10-CM | POA: Diagnosis not present

## 2023-08-16 DIAGNOSIS — D631 Anemia in chronic kidney disease: Secondary | ICD-10-CM | POA: Diagnosis not present

## 2023-08-16 DIAGNOSIS — Z992 Dependence on renal dialysis: Secondary | ICD-10-CM | POA: Diagnosis not present

## 2023-08-16 DIAGNOSIS — R519 Headache, unspecified: Secondary | ICD-10-CM | POA: Diagnosis not present

## 2023-08-16 DIAGNOSIS — R52 Pain, unspecified: Secondary | ICD-10-CM | POA: Diagnosis not present

## 2023-08-16 DIAGNOSIS — N186 End stage renal disease: Secondary | ICD-10-CM | POA: Diagnosis not present

## 2023-08-16 DIAGNOSIS — N2581 Secondary hyperparathyroidism of renal origin: Secondary | ICD-10-CM | POA: Diagnosis not present

## 2023-08-18 DIAGNOSIS — Z992 Dependence on renal dialysis: Secondary | ICD-10-CM | POA: Diagnosis not present

## 2023-08-18 DIAGNOSIS — D631 Anemia in chronic kidney disease: Secondary | ICD-10-CM | POA: Diagnosis not present

## 2023-08-18 DIAGNOSIS — R519 Headache, unspecified: Secondary | ICD-10-CM | POA: Diagnosis not present

## 2023-08-18 DIAGNOSIS — R52 Pain, unspecified: Secondary | ICD-10-CM | POA: Diagnosis not present

## 2023-08-18 DIAGNOSIS — N2581 Secondary hyperparathyroidism of renal origin: Secondary | ICD-10-CM | POA: Diagnosis not present

## 2023-08-18 DIAGNOSIS — N186 End stage renal disease: Secondary | ICD-10-CM | POA: Diagnosis not present

## 2023-08-20 DIAGNOSIS — N2581 Secondary hyperparathyroidism of renal origin: Secondary | ICD-10-CM | POA: Diagnosis not present

## 2023-08-20 DIAGNOSIS — R519 Headache, unspecified: Secondary | ICD-10-CM | POA: Diagnosis not present

## 2023-08-20 DIAGNOSIS — N186 End stage renal disease: Secondary | ICD-10-CM | POA: Diagnosis not present

## 2023-08-20 DIAGNOSIS — R52 Pain, unspecified: Secondary | ICD-10-CM | POA: Diagnosis not present

## 2023-08-20 DIAGNOSIS — D631 Anemia in chronic kidney disease: Secondary | ICD-10-CM | POA: Diagnosis not present

## 2023-08-20 DIAGNOSIS — Z992 Dependence on renal dialysis: Secondary | ICD-10-CM | POA: Diagnosis not present

## 2023-08-22 ENCOUNTER — Encounter: Payer: Self-pay | Admitting: Family Medicine

## 2023-08-22 ENCOUNTER — Ambulatory Visit (INDEPENDENT_AMBULATORY_CARE_PROVIDER_SITE_OTHER): Payer: Medicare Other | Admitting: Family Medicine

## 2023-08-22 ENCOUNTER — Other Ambulatory Visit (HOSPITAL_COMMUNITY): Payer: Self-pay

## 2023-08-22 ENCOUNTER — Telehealth: Payer: Self-pay

## 2023-08-22 VITALS — BP 128/62 | HR 80 | Temp 98.2°F | Resp 20 | Ht 62.0 in | Wt 212.0 lb

## 2023-08-22 DIAGNOSIS — N186 End stage renal disease: Secondary | ICD-10-CM

## 2023-08-22 DIAGNOSIS — E1129 Type 2 diabetes mellitus with other diabetic kidney complication: Secondary | ICD-10-CM

## 2023-08-22 DIAGNOSIS — Z992 Dependence on renal dialysis: Secondary | ICD-10-CM | POA: Diagnosis not present

## 2023-08-22 DIAGNOSIS — Z7985 Long-term (current) use of injectable non-insulin antidiabetic drugs: Secondary | ICD-10-CM

## 2023-08-22 DIAGNOSIS — E1140 Type 2 diabetes mellitus with diabetic neuropathy, unspecified: Secondary | ICD-10-CM | POA: Diagnosis not present

## 2023-08-22 DIAGNOSIS — R296 Repeated falls: Secondary | ICD-10-CM | POA: Diagnosis not present

## 2023-08-22 DIAGNOSIS — T887XXA Unspecified adverse effect of drug or medicament, initial encounter: Secondary | ICD-10-CM | POA: Diagnosis not present

## 2023-08-22 DIAGNOSIS — W19XXXD Unspecified fall, subsequent encounter: Secondary | ICD-10-CM | POA: Diagnosis not present

## 2023-08-22 DIAGNOSIS — Z23 Encounter for immunization: Secondary | ICD-10-CM

## 2023-08-22 DIAGNOSIS — R7989 Other specified abnormal findings of blood chemistry: Secondary | ICD-10-CM | POA: Diagnosis not present

## 2023-08-22 DIAGNOSIS — M4716 Other spondylosis with myelopathy, lumbar region: Secondary | ICD-10-CM | POA: Diagnosis not present

## 2023-08-22 DIAGNOSIS — Z7984 Long term (current) use of oral hypoglycemic drugs: Secondary | ICD-10-CM | POA: Diagnosis not present

## 2023-08-22 DIAGNOSIS — M542 Cervicalgia: Secondary | ICD-10-CM | POA: Diagnosis not present

## 2023-08-22 LAB — COMPREHENSIVE METABOLIC PANEL
ALT: 13 U/L (ref 0–35)
AST: 16 U/L (ref 0–37)
Albumin: 3.9 g/dL (ref 3.5–5.2)
Alkaline Phosphatase: 138 U/L — ABNORMAL HIGH (ref 39–117)
BUN: 49 mg/dL — ABNORMAL HIGH (ref 6–23)
CO2: 33 meq/L — ABNORMAL HIGH (ref 19–32)
Calcium: 9.7 mg/dL (ref 8.4–10.5)
Chloride: 90 meq/L — ABNORMAL LOW (ref 96–112)
Creatinine, Ser: 5.83 mg/dL (ref 0.40–1.20)
GFR: 6.83 mL/min — CL (ref >60.00)
Glucose, Bld: 148 mg/dL — ABNORMAL HIGH (ref 70–99)
Potassium: 5.3 meq/L — ABNORMAL HIGH (ref 3.5–5.1)
Sodium: 136 meq/L (ref 135–145)
Total Bilirubin: 0.3 mg/dL (ref 0.2–1.2)
Total Protein: 7 g/dL (ref 6.0–8.3)

## 2023-08-22 LAB — LIPID PANEL
Cholesterol: 129 mg/dL (ref 0–200)
HDL: 50.6 mg/dL (ref >39.00)
LDL Cholesterol: 37 mg/dL (ref 0–99)
NonHDL: 78.47
Total CHOL/HDL Ratio: 3
Triglycerides: 207 mg/dL — ABNORMAL HIGH (ref 0.0–149.0)
VLDL: 41.4 mg/dL — ABNORMAL HIGH (ref 0.0–40.0)

## 2023-08-22 LAB — TSH: TSH: 3.41 u[IU]/mL (ref 0.35–5.50)

## 2023-08-22 LAB — HEMOGLOBIN A1C: Hgb A1c MFr Bld: 6.5 % (ref 4.6–6.5)

## 2023-08-22 MED ORDER — LIDOCAINE 5 % EX OINT
1.0000 | TOPICAL_OINTMENT | Freq: Three times a day (TID) | CUTANEOUS | 0 refills | Status: DC | PRN
  Filled 2023-08-22: qty 100, 34d supply, fill #0

## 2023-08-22 MED ORDER — LIDOCAINE 5 % EX OINT: 1.0000 | TOPICAL_OINTMENT | Freq: Three times a day (TID) | CUTANEOUS | 0 refills | Status: DC | PRN

## 2023-08-22 NOTE — Patient Instructions (Signed)
Decrease Ozempic from 0.5 mg to 0.25 mg once weekly.  Stop downstairs to get your labs today.

## 2023-08-22 NOTE — Progress Notes (Signed)
Assessment & Plan:  1-4. Type 2 diabetes mellitus with other diabetic kidney complication ([name])/Long-term (current) use of injectable non-insulin antidiabetic drugs/Long term current use of oral hypoglycemic drug/Medication side effect Unknown control of diabetes as patient has not had an A1c since March 2023.  Discussed nausea and vomiting as a common side effect of Ozempic and recommended she decrease her dose from 0.5 mg back down to 0.25 mg once weekly.  Encouraged her to complete her lab work that was ordered by her PCP in April. - Lipid panel - TSH - Hemoglobin A1c - Comprehensive metabolic panel  5. Encounter for immunization - Flu Vaccine Trivalent High Dose (Fluad)   Follow up plan: No follow-ups on file.  Deliah Boston, MSN, APRN, FNP-C  Subjective:  HPI: [name] is a 72 y.o. female presenting on 08/22/2023 for Nausea (With vomiting - this is an occasional thing. She wonders if it could be related to any of her meds. When the sensation hits - it is immediate vomit response/(Hx of Migraines- with nausea and vomiting - but these episodes do not always include HA))  Patient reports nausea and vomiting that started a couple of months ago after being started on Ozempic. It comes on all of a sudden. It is not daily. She is unsure why she was started on it. She does report some weight loss. Her last A1c was 6.3 in March 2023.    ROS: Negative unless specifically indicated above in HPI.   Relevant past medical history reviewed and updated as indicated.   Allergies and medications reviewed and updated.   Current Outpatient Medications:    Accu-Chek Softclix Lancets lancets, Use 1 lancet up to 4 times daily as directed, Disp: 100 each, Rfl: 0   acetaminophen (TYLENOL) 500 MG tablet, Take 1,000 mg by mouth 2 (two) times daily as needed for headache., Disp: , Rfl:    aspirin-acetaminophen-caffeine (EXCEDRIN MIGRAINE) 250-250-65 MG tablet, Take 1 tablet by mouth daily as needed  for headache or migraine. Max 3 doses in one week, Disp: , Rfl:    B Complex-C-Folic Acid (RENA-VITE PO), Take 1 tablet by mouth every morning., Disp: , Rfl:    Blood Glucose Monitoring Suppl (ACCU-CHEK GUIDE ME) w/Device KIT, Use as directed to check blood sugar four times daily., Disp: 1 kit, Rfl: 0   cyclobenzaprine (FLEXERIL) 10 MG tablet, Take 1 tablet (10 mg total) by mouth 3 (three) times daily as needed for muscle spasms., Disp: 270 tablet, Rfl: 0   Evolocumab (REPATHA SURECLICK) 140 MG/ML SOAJ, Inject 140 mg into the skin every 14 (fourteen) days., Disp: 2 mL, Rfl: 11   ezetimibe (ZETIA) 10 MG tablet, TAKE 1 TABLET BY MOUTH DAILY, Disp: 90 tablet, Rfl: 3   FLUoxetine (PROZAC) 40 MG capsule, Take 1 capsule (40 mg total) by mouth in the morning., Disp: 90 capsule, Rfl: 2   glimepiride (AMARYL) 1 MG tablet, TAKE 1 TABLET BY MOUTH DAILY  WITH BREAKFAST, Disp: 100 tablet, Rfl: 2   glucose blood (ACCU-CHEK GUIDE) test strip, Use to check blood sugar up to four times daily., Disp: 50 each, Rfl: 0   lanthanum (FOSRENOL) 1000 MG chewable tablet, Chew 1,000 mg by mouth. Taking 2 with meals and 1 with snacks, Disp: , Rfl:    lidocaine (LIDODERM) 5 %, PLACE 2 PATCHES ONTO THE SKIN  DAILY, Disp: 120 patch, Rfl: 1   lidocaine (XYLOCAINE) 5 % ointment, Apply 1 application topically 3 (three) times daily as needed for moderate pain.,  Disp: 90 g, Rfl: 0   LORazepam (ATIVAN) 2 MG tablet, Take 1 tablet (2 mg total) by mouth 2 (two) times daily as needed for anxiety, Disp: 60 tablet, Rfl: 5   midodrine (PROAMATINE) 5 MG tablet, Take 1-2 tablet by mouth three times a week as directed Take 30 minutes before dialysis and another tablet during middle of dialysis treatment (Patient taking differently: Take 5 mg by mouth See admin instructions. Take 5 mg by mouth at 5:30 am  and 8:30 am on Tuesday, Thursday and Saturday.), Disp: 90 tablet, Rfl: 3   multivitamin-lutein (OCUVITE-LUTEIN) CAPS capsule, Take 1 capsule by  mouth every morning., Disp: , Rfl:    ondansetron (ZOFRAN) 4 MG tablet, Take 1 tablet (4 mg total) by mouth every 8 (eight) hours as needed for nausea or vomiting., Disp: 10 tablet, Rfl: 0   oxyCODONE (ROXICODONE) 15 MG immediate release tablet, Take 1 tablet (15 mg total) by mouth 4 (four) times daily as needed., Disp: 120 tablet, Rfl: 0   pantoprazole (PROTONIX) 40 MG tablet, Take 1 tablet (40 mg total) by mouth 2 (two) times daily., Disp: 180 tablet, Rfl: 3   pilocarpine (SALAGEN) 5 MG tablet, Take 1 tablet (5 mg total) by mouth 2 (two) times daily., Disp: 200 tablet, Rfl: 0   pioglitazone (ACTOS) 15 MG tablet, Take 1 tablet (15 mg total) by mouth daily., Disp: 100 tablet, Rfl: 2   polyethylene glycol (MIRALAX / GLYCOLAX) 17 g packet, Take 17 g by mouth every 7 (seven) days., Disp: , Rfl:    PREVIDENT 5000 DRY MOUTH 1.1 % GEL dental gel, Place 1 application  onto teeth in the morning and at bedtime., Disp: , Rfl:    Semaglutide,0.25 or 0.5MG /DOS, (OZEMPIC, 0.25 OR 0.5 MG/DOSE,) 2 MG/3ML SOPN, INJECT SUBCUTANEOUSLY  0.5 MG WEEKLY, Disp: 9 mL, Rfl: 3   blood glucose meter kit and supplies, Dispense based on patient and insurance preference. Used to check blood sugar daily, DX: E11.9 OneTouch (Patient not taking: Reported on 07/04/2023), Disp: 1 each, Rfl: 0   buPROPion (WELLBUTRIN XL) 300 MG 24 hr tablet, TAKE 1 TABLET BY MOUTH AT  BEDTIME (Patient not taking: Reported on 08/22/2023), Disp: 90 tablet, Rfl: 3   carboxymethylcellulose (REFRESH PLUS) 0.5 % SOLN, Place 1 drop into both eyes 3 (three) times daily as needed (dry eyes/irritation). (Patient not taking: Reported on 08/22/2023), Disp: , Rfl:    ciclopirox (PENLAC) 8 % solution, Apply topically at bedtime. Apply over nail and surrounding skin daily (Patient not taking: Reported on 07/04/2023), Disp: 6.6 mL, Rfl: 1   ketoconazole (NIZORAL) 2 % cream, Apply 1 Application topically daily. (Patient not taking: Reported on 07/04/2023), Disp: 60 g, Rfl: 3    naloxone (NARCAN) nasal spray 4 mg/0.1 mL, Take by nasal route every 3 minutes until patient awakes or EMS arrives. (Patient not taking: Reported on 07/04/2023), Disp: 2 each, Rfl: 0   oxyCODONE (ROXICODONE) 15 MG immediate release tablet, Take 1 tablet (15 mg total) by mouth 4 (four) times daily as needed. (Patient not taking: Reported on 08/22/2023), Disp: 120 tablet, Rfl: 0  Allergies  Allergen Reactions   Erythromycin Diarrhea and Nausea And Vomiting   Gabapentin Other (See Comments)    Confusion and falling  Hallucinations    Objective:   BP 128/62   Pulse 80   Temp 98.2 F (36.8 C)   Resp 20   Ht 5\' 2"  (1.575 m)   Wt 212 lb (96.2 kg)   BMI 38.78  kg/m    Physical Exam Vitals reviewed.  Constitutional:      General: She is not in acute distress.    Appearance: Normal appearance. She is not ill-appearing, toxic-appearing or diaphoretic.  HENT:     Head: Normocephalic and atraumatic.  Eyes:     General: No scleral icterus.       Right eye: No discharge.        Left eye: No discharge.     Conjunctiva/sclera: Conjunctivae normal.  Cardiovascular:     Rate and Rhythm: Normal rate.  Pulmonary:     Effort: Pulmonary effort is normal. No respiratory distress.  Musculoskeletal:        General: Normal range of motion.     Cervical back: Normal range of motion.  Skin:    General: Skin is warm and dry.     Capillary Refill: Capillary refill takes less than 2 seconds.  Neurological:     General: No focal deficit present.     Mental Status: She is alert and oriented to person, place, and time. Mental status is at baseline.  Psychiatric:        Mood and Affect: Mood normal.        Behavior: Behavior normal.        Thought Content: Thought content normal.        Judgment: Judgment normal.

## 2023-08-22 NOTE — Telephone Encounter (Signed)
CRITICAL VALUE STICKER  CRITICAL VALUE: Creatinine 5.83 and GFR 6.83  RECEIVER (on-site recipient of call): Russia Scheiderer  DATE & TIME NOTIFIED: 08/22/2023 @ 4:10 pm  MESSENGER (representative from lab): Hope  MD NOTIFIED: Yes  TIME OF NOTIFICATION: 08/22/2023 @ 4: 14 pm  RESPONSE:  Pt is currently on Dialysis

## 2023-08-23 ENCOUNTER — Encounter: Payer: Self-pay | Admitting: Family Medicine

## 2023-08-23 DIAGNOSIS — Z992 Dependence on renal dialysis: Secondary | ICD-10-CM | POA: Diagnosis not present

## 2023-08-23 DIAGNOSIS — R519 Headache, unspecified: Secondary | ICD-10-CM | POA: Diagnosis not present

## 2023-08-23 DIAGNOSIS — D631 Anemia in chronic kidney disease: Secondary | ICD-10-CM | POA: Diagnosis not present

## 2023-08-23 DIAGNOSIS — N186 End stage renal disease: Secondary | ICD-10-CM | POA: Diagnosis not present

## 2023-08-23 DIAGNOSIS — R52 Pain, unspecified: Secondary | ICD-10-CM | POA: Diagnosis not present

## 2023-08-23 DIAGNOSIS — N2581 Secondary hyperparathyroidism of renal origin: Secondary | ICD-10-CM | POA: Diagnosis not present

## 2023-08-23 NOTE — Telephone Encounter (Signed)
Noted.  The patient is on HD

## 2023-08-24 DIAGNOSIS — H524 Presbyopia: Secondary | ICD-10-CM | POA: Diagnosis not present

## 2023-08-24 DIAGNOSIS — H52223 Regular astigmatism, bilateral: Secondary | ICD-10-CM | POA: Diagnosis not present

## 2023-08-24 DIAGNOSIS — E119 Type 2 diabetes mellitus without complications: Secondary | ICD-10-CM | POA: Diagnosis not present

## 2023-08-24 LAB — HM DIABETES EYE EXAM

## 2023-08-25 DIAGNOSIS — R519 Headache, unspecified: Secondary | ICD-10-CM | POA: Diagnosis not present

## 2023-08-25 DIAGNOSIS — N2581 Secondary hyperparathyroidism of renal origin: Secondary | ICD-10-CM | POA: Diagnosis not present

## 2023-08-25 DIAGNOSIS — Z992 Dependence on renal dialysis: Secondary | ICD-10-CM | POA: Diagnosis not present

## 2023-08-25 DIAGNOSIS — R52 Pain, unspecified: Secondary | ICD-10-CM | POA: Diagnosis not present

## 2023-08-25 DIAGNOSIS — N186 End stage renal disease: Secondary | ICD-10-CM | POA: Diagnosis not present

## 2023-08-25 DIAGNOSIS — D631 Anemia in chronic kidney disease: Secondary | ICD-10-CM | POA: Diagnosis not present

## 2023-08-26 ENCOUNTER — Other Ambulatory Visit (HOSPITAL_COMMUNITY): Payer: Self-pay

## 2023-08-26 MED ORDER — BUPROPION HCL ER (XL) 300 MG PO TB24: 300.0000 mg | ORAL_TABLET | Freq: Every morning | ORAL | 1 refills | Status: DC

## 2023-08-26 MED ORDER — LORAZEPAM 2 MG PO TABS
2.0000 mg | ORAL_TABLET | Freq: Two times a day (BID) | ORAL | 5 refills | Status: DC
Start: 2023-08-26 — End: 2024-09-26
  Filled 2023-11-02: qty 60, 30d supply, fill #0
  Filled 2023-12-26 – 2023-12-27 (×2): qty 60, 30d supply, fill #1
  Filled 2024-02-14: qty 60, 30d supply, fill #2

## 2023-08-26 MED ORDER — FLUOXETINE HCL 40 MG PO CAPS
40.0000 mg | ORAL_CAPSULE | Freq: Every morning | ORAL | 1 refills | Status: AC
  Filled 2023-08-26 – 2024-01-03 (×2): qty 90, 90d supply, fill #0

## 2023-08-27 DIAGNOSIS — Z992 Dependence on renal dialysis: Secondary | ICD-10-CM | POA: Diagnosis not present

## 2023-08-27 DIAGNOSIS — N186 End stage renal disease: Secondary | ICD-10-CM | POA: Diagnosis not present

## 2023-08-27 DIAGNOSIS — D631 Anemia in chronic kidney disease: Secondary | ICD-10-CM | POA: Diagnosis not present

## 2023-08-27 DIAGNOSIS — R52 Pain, unspecified: Secondary | ICD-10-CM | POA: Diagnosis not present

## 2023-08-27 DIAGNOSIS — N2581 Secondary hyperparathyroidism of renal origin: Secondary | ICD-10-CM | POA: Diagnosis not present

## 2023-08-27 DIAGNOSIS — R519 Headache, unspecified: Secondary | ICD-10-CM | POA: Diagnosis not present

## 2023-08-29 ENCOUNTER — Ambulatory Visit (INDEPENDENT_AMBULATORY_CARE_PROVIDER_SITE_OTHER): Payer: Medicare Other

## 2023-08-29 VITALS — BP 128/72 | HR 88 | Temp 98.0°F | Ht 62.0 in | Wt 210.0 lb

## 2023-08-29 DIAGNOSIS — Z0000 Encounter for general adult medical examination without abnormal findings: Secondary | ICD-10-CM

## 2023-08-29 NOTE — Patient Instructions (Signed)
It was great speaking with you today!!   Please schedule your next Medicare Wellness Visit with your Nurse Health Advisor in 1 year by calling [phone].

## 2023-08-29 NOTE — Progress Notes (Addendum)
Subjective:   [name] is a 72 y.o. female who presents for Medicare Annual (Subsequent) preventive examination.  Visit Complete: In person  Patient Medicare AWV questionnaire was completed by the patient on N/A; I have confirmed that all information answered by patient is correct and no changes since this date.  Cardiac Risk Factors include: advanced age (>73men, >31 women);diabetes mellitus;dyslipidemia;family history of premature cardiovascular disease;hypertension;sedentary lifestyle;obesity (BMI >30kg/m2)     Objective:    Today's Vitals   08/29/23 1027  BP: 128/72  Pulse: 88  Temp: 98 F (36.7 C)  TempSrc: Temporal  SpO2: 97%  Weight: 210 lb (95.3 kg)  Height: 5\' 2"  (1.575 m)   Body mass index is 38.41 kg/m.     08/29/2023   10:55 AM 06/07/2023    7:29 PM 08/02/2022    4:08 PM 05/28/2022    9:29 AM 03/11/2022    1:46 PM 03/09/2022   11:20 PM 08/06/2021   11:00 AM  Advanced Directives  Does Patient Have a Medical Advance Directive? Yes No Yes Yes No;Yes No Yes  Type of Advance Directive   Living will;Healthcare Power of [employer] Power of [place];Living will Healthcare Power of [place];Living will  Healthcare Power of [place];Living will  Does patient want to make changes to medical advance directive? No - Patient declined  No - Patient declined    No - Patient declined  Copy of Healthcare Power of Attorney in Chart?   No - copy requested  No - copy requested  No - copy requested  Would patient like information on creating a medical advance directive?  No - Guardian declined         Current Medications (verified) Outpatient Encounter Medications as of 08/29/2023  Medication Sig   Accu-Chek Softclix Lancets lancets Use 1 lancet up to 4 times daily as directed   acetaminophen (TYLENOL) 500 MG tablet Take 1,000 mg by mouth 2 (two) times daily as needed for headache.   aspirin-acetaminophen-caffeine (EXCEDRIN MIGRAINE) 250-250-65 MG tablet Take 1 tablet  by mouth daily as needed for headache or migraine. Max 3 doses in one week   B Complex-C-Folic Acid (RENA-VITE PO) Take 1 tablet by mouth every morning.   blood glucose meter kit and supplies Dispense based on patient and insurance preference. Used to check blood sugar daily, DX: E11.9 OneTouch   Blood Glucose Monitoring Suppl (ACCU-CHEK GUIDE ME) w/Device KIT Use as directed to check blood sugar four times daily.   buPROPion (WELLBUTRIN XL) 300 MG 24 hr tablet TAKE 1 TABLET BY MOUTH AT  BEDTIME   buPROPion (WELLBUTRIN XL) 300 MG 24 hr tablet Take 1 tablet (300 mg total) by mouth in the morning.   carboxymethylcellulose (REFRESH PLUS) 0.5 % SOLN Place 1 drop into both eyes 3 (three) times daily as needed (dry eyes/irritation).   ciclopirox (PENLAC) 8 % solution Apply topically at bedtime. Apply over nail and surrounding skin daily   cyclobenzaprine (FLEXERIL) 10 MG tablet Take 1 tablet (10 mg total) by mouth 3 (three) times daily as needed for muscle spasms.   Evolocumab (REPATHA SURECLICK) 140 MG/ML SOAJ Inject 140 mg into the skin every 14 (fourteen) days.   ezetimibe (ZETIA) 10 MG tablet TAKE 1 TABLET BY MOUTH DAILY   FLUoxetine (PROZAC) 40 MG capsule Take 1 capsule (40 mg total) by mouth in the morning.   FLUoxetine (PROZAC) 40 MG capsule Take 1 capsule (40 mg total) by mouth in the morning.   glimepiride (AMARYL) 1 MG  tablet TAKE 1 TABLET BY MOUTH DAILY  WITH BREAKFAST   glucose blood (ACCU-CHEK GUIDE) test strip Use to check blood sugar up to four times daily.   ketoconazole (NIZORAL) 2 % cream Apply 1 Application topically daily.   lanthanum (FOSRENOL) 1000 MG chewable tablet Chew 1,000 mg by mouth. Taking 2 with meals and 1 with snacks   lidocaine (LIDODERM) 5 % PLACE 2 PATCHES ONTO THE SKIN  DAILY   lidocaine (XYLOCAINE) 5 % ointment Apply 1 Application topically 3 (three) times daily as needed for moderate pain.   LORazepam (ATIVAN) 2 MG tablet Take 1 tablet (2 mg total) by mouth 2  (two) times daily as needed for anxiety   LORazepam (ATIVAN) 2 MG tablet Take 1 tablet (2 mg total) by mouth 2 (two) times daily as needed for anxiety.   midodrine (PROAMATINE) 5 MG tablet Take 1-2 tablet by mouth three times a week as directed Take 30 minutes before dialysis and another tablet during middle of dialysis treatment (Patient taking differently: Take 5 mg by mouth See admin instructions. Take 5 mg by mouth at 5:30 am  and 8:30 am on Tuesday, Thursday and Saturday.)   multivitamin-lutein (OCUVITE-LUTEIN) CAPS capsule Take 1 capsule by mouth every morning.   naloxone (NARCAN) nasal spray 4 mg/0.1 mL Take by nasal route every 3 minutes until patient awakes or EMS arrives.   ondansetron (ZOFRAN) 4 MG tablet Take 1 tablet (4 mg total) by mouth every 8 (eight) hours as needed for nausea or vomiting.   oxyCODONE (ROXICODONE) 15 MG immediate release tablet Take 1 tablet (15 mg total) by mouth 4 (four) times daily as needed.   oxyCODONE (ROXICODONE) 15 MG immediate release tablet Take 1 tablet (15 mg total) by mouth 4 (four) times daily as needed.   pantoprazole (PROTONIX) 40 MG tablet Take 1 tablet (40 mg total) by mouth 2 (two) times daily.   pilocarpine (SALAGEN) 5 MG tablet Take 1 tablet (5 mg total) by mouth 2 (two) times daily.   pioglitazone (ACTOS) 15 MG tablet Take 1 tablet (15 mg total) by mouth daily.   polyethylene glycol (MIRALAX / GLYCOLAX) 17 g packet Take 17 g by mouth every 7 (seven) days.   PREVIDENT 5000 DRY MOUTH 1.1 % GEL dental gel Place 1 application  onto teeth in the morning and at bedtime.   Semaglutide,0.25 or 0.5MG /DOS, (OZEMPIC, 0.25 OR 0.5 MG/DOSE,) 2 MG/3ML SOPN INJECT SUBCUTANEOUSLY  0.5 MG WEEKLY   No facility-administered encounter medications on file as of 08/29/2023.    Allergies (verified) Erythromycin and Gabapentin   History: Past Medical History:  Diagnosis Date   Anemia    Anxiety    Brain tumor (benign) ([name])    Colitis 2010   microscopic- Dr  Marina Goodell   Depression    Diabetes mellitus    type II   Dyspnea    with exertion   ESRD (end stage renal disease) ([name])    TTUSAT [address]    Fibromyalgia    GERD (gastroesophageal reflux disease)    Headache    History of blood transfusion    after knee surgery   Hypertension    discontinued all diuretics and antihypertensives   IBS (irritable bowel syndrome)    LBP (low back pain)    Neuropathy    feet bilat    Osteoarthritis    Osteopenia    Pneumonia    hx of 2014    Rotator cuff tear, right    Sinusitis  currently being treated with antibiotic will complete 03/04/2015   Past Surgical History:  Procedure Laterality Date   AV FISTULA PLACEMENT Right 09/12/2020   Procedure: RIGHT ARTERIOVENOUS (AV) FISTULA CREATION;  Surgeon: Sherren Kerns, MD;  Location: [facility] OR;  Service: Vascular;  Laterality: Right;   BASCILIC VEIN TRANSPOSITION Right 10/27/2020   Procedure: RIGHT UPPER EXTREMITY SECOND STAGE BASCILIC VEIN TRANSPOSITION;  Surgeon: Sherren Kerns, MD;  Location[facility] OR;  Service: Vascular;  Laterality: Right;   BIOPSY  03/11/2022   Procedure: BIOPSY;  Surgeon: Lemar Lofty., MD;  Location: [facility] ENDOSCOPY;  Service: Gastroenterology;;   COLONOSCOPY WITH PROPOFOL N/A 03/11/2022   Procedure: COLONOSCOPY WITH PROPOFOL;  Surgeon: Lemar Lofty., MD;  Location: [facility] ENDOSCOPY;  Service: Gastroenterology;  Laterality: N/A;   foramen magnum ependymoma surgery  2003   Dr Jule Ser   JOINT REPLACEMENT Bilateral    NASAL SINUS SURGERY     1973    REVERSE SHOULDER ARTHROPLASTY Right 06/03/2022   Procedure: REVERSE SHOULDER ARTHROPLASTY;  Surgeon: Francena Hanly, MD;  Location: [facility] ORS;  Service: Orthopedics;  Laterality: Right;    TONSILLECTOMY     TOTAL KNEE ARTHROPLASTY     L 2008, R 2009, R 2016- Dr Despina Hick   TOTAL KNEE REVISION Right 03/05/2015   Procedure: RIGHT TOTAL KNEE ARTHROPLASTY REVISION;  Surgeon: Ollen Gross, MD;  Location: [facility] ORS;  Service:  Orthopedics;  Laterality: Right;   Family History  Problem Relation Age of Onset   Depression Mother    Hypertension Mother    Stroke Mother 76   Diabetes Mother    Diabetes Father    Hyperlipidemia Father    Hypertension Father    Crohn's disease Maternal Uncle    Cancer Brother        Prostate   Diabetes Brother    Heart disease Brother    Hyperlipidemia Brother    Hypertension Brother    Diabetes Brother    Heart disease Brother        Heart Disease before age 47   Heart attack Brother    Stroke Brother        X's 2   Hyperlipidemia Brother    Hypertension Brother    Diabetes Other    Colon cancer Neg Hx    Esophageal cancer Neg Hx    Stomach cancer Neg Hx    Pancreatic cancer Neg Hx    Breast cancer Neg Hx    Social History   Socioeconomic History   Marital status: Single    Spouse name: Not on file   Number of children: 0   Years of education: Not on file   Highest education level: Not on file  Occupational History   Not on file  Tobacco Use   Smoking status: Former    Current packs/day: 0.00    Average packs/day: 1 pack/day for 20.0 years (20.0 ttl pk-yrs)    Types: Cigarettes    Start date: 12/13/1968    Quit date: 12/13/1988    Years since quitting: 34.7   Smokeless tobacco: Never  Vaping Use   Vaping status: Never Used  Substance and Sexual Activity   Alcohol use: Not Currently    Alcohol/week: 0.0 standard drinks of alcohol    Comment: rarely   Drug use: No   Sexual activity: Not Currently  Other Topics Concern   Not on file  Social History Narrative   Not on file   Social Determinants of Health   Financial  Resource Strain: Low Risk  (08/29/2023)   Overall Financial Resource Strain (CARDIA)    Difficulty of Paying Living Expenses: Not hard at all  Food Insecurity: No Food Insecurity (08/29/2023)   Hunger Vital Sign    Worried About Running Out of Food in the Last Year: Never true    Ran Out of Food in the Last Year: Never true   Transportation Needs: No Transportation Needs (08/29/2023)   PRAPARE - Administrator, Civil Service (Medical): No    Lack of Transportation (Non-Medical): No  Physical Activity: Inactive (08/29/2023)   Exercise Vital Sign    Days of Exercise per Week: 0 days    Minutes of Exercise per Session: 0 min  Stress: No Stress Concern Present (08/29/2023)   [employer] of Occupational Health - Occupational Stress Questionnaire    Feeling of Stress : Not at all  Social Connections: Socially Isolated (08/29/2023)   Social Connection and Isolation Panel [NHANES]    Frequency of Communication with Friends and Family: More than three times a week    Frequency of Social Gatherings with Friends and Family: Twice a week    Attends Religious Services: Never    Database administrator or Organizations: No    Attends Engineer, structural: Never    Marital Status: Never married    Tobacco Counseling Counseling given: Not Answered   Clinical Intake:  Pre-visit preparation completed: Yes  Pain : No/denies pain     BMI - recorded: 38 Nutritional Status: BMI > 30  Obese Nutritional Risks: None Diabetes: Yes CBG done?: No Did pt. 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 0  Vision? 0  Difficulty concentrating or making decisions? 1  Comment sometimes  Walking or climbing stairs? 0  Dressing or bathing? 0  Doing errands, shopping? 0  Preparing Food and eating ? N  Using the Toilet? N  In the  past six months, have you accidently leaked urine? N  Do you have problems with loss of bowel control? N  Managing your Medications? N  Managing your Finances? N  Housekeeping or managing your Housekeeping? N    Patient Care Team: Plotnikov, Georgina Quint, MD as PCP - General Croitoru, Rachelle Hora, MD as PCP - Cardiology (Cardiology) Delfin Gant, MD as Attending Physician (Sports Medicine) Annie Sable, MD as Consulting Physician (Nephrology) [facility] Arita Miss, MD as Attending Physician (Nephrology) Dominica Severin, MD as Consulting Physician (Orthopedic Surgery) Francena Hanly, MD as Consulting Physician (Orthopedic Surgery) Szabat, Vinnie Level, [facility] (Inactive) as Pharmacist (Pharmacist) Ellis Savage, NP as Nurse Practitioner Croitoru, Rachelle Hora, MD as Consulting Physician (Cardiology) Earnstine Regal, OD as Consulting Physician (Optometry)  Indicate any recent Medical Services you may have received from other than [facility] providers in the past year (date may be approximate).     Assessment:   This is a routine wellness examination for [name].  Hearing/Vision screen Patient denied any hearing difficulty. No hearing aids.  Patient wears corrective lenses.    Goals Addressed             This Visit's Progress    Patient Stated       I would like to find a way to get a kidney transplant, but due to not having family around they are not allowing me to have this done due to not having transportation for follow up appointments.        Depression Screen    08/29/2023   10:54 AM 08/22/2023   11:06 AM 06/08/2023    4:10 PM 04/04/2023    2:07 PM 01/24/2023   10:32 AM 08/02/2022    4:17 PM 06/25/2022    8:25 AM  PHQ 2/9 Scores  PHQ - 2 Score 3 0 0 0 0 5 5  PHQ- 9 Score    0 0 22 22    Fall Risk    08/29/2023   10:55 AM 08/22/2023   11:06 AM 06/08/2023    4:09 PM 04/04/2023    2:07 PM 01/24/2023   10:32 AM  Fall Risk   Falls in the past year? 1 1 1  0 0  Number  falls in past yr: 1 1 1  0 0  Injury with Fall? 1 1 1  0 0  Risk for fall due to : History of fall(s);Impaired balance/gait History of fall(s) History of fall(s)  No Fall Risks  Follow up Falls evaluation completed Falls evaluation completed Falls evaluation completed Falls evaluation completed Falls evaluation completed    MEDICARE RISK AT HOME: Medicare Risk at Home Any stairs in or around the home?: Yes If so, are there any without handrails?: No Home free of loose throw rugs in walkways, pet beds, electrical cords, etc?: No Adequate lighting in your home to reduce risk of falls?: Yes Life alert?: No Use of a cane, walker or w/c?: Yes Grab bars in the bathroom?: Yes Shower chair or bench in shower?: Yes Elevated toilet seat or a handicapped toilet?: Yes  TIMED UP AND GO:  Was the test performed?  No    Cognitive Function:  Patient is cogitatively intact.      08/29/2023   10:56 AM 08/02/2022    4:27 PM  6CIT Screen  What Year? 0 points 0 points  What month? 0 points 0 points  What time? 0 points 0 points  Count back from 20 0 points 0 points  Months in reverse 0 points 0 points  Repeat phrase 0 points 0 points  Total Score 0 points 0 points    Immunizations Immunization History  Administered Date(s) Administered   Fluad Quad(high Dose 65+) 08/29/2019, 10/27/2020, 08/31/2022   Fluad Trivalent(High Dose 65+) 08/22/2023   Hep A / Hep B 01/16/2019, 04/16/2019, 07/17/2019   Influenza Split 08/20/2010, 08/14/2011, 09/28/2017   Influenza Whole 08/20/2010, 08/14/2011   Influenza, High Dose Seasonal PF 09/19/2017, 10/12/2018, 08/29/2019, 10/23/2020, 09/24/2021   Influenza,inj,Quad PF,6+ Mos 09/19/2015, 08/20/2016   Influenza-Unspecified 09/12/2013, 09/16/2014, 09/19/2015, 08/20/2016, 09/28/2017, 10/27/2020, 10/22/2021   PFIZER Comirnaty(Gray Top)Covid-19 Tri-Sucrose Vaccine 03/22/2021   PFIZER(Purple Top)SARS-COV-2 Vaccination 01/18/2020, 02/08/2020, 09/18/2020, 10/27/2020    Pneumococcal Conjugate-13 03/20/2014   Pneumococcal Polysaccharide-23 02/04/2010, 11/29/2020   Respiratory Syncytial Virus Vaccine,Recomb Aduvanted(Arexvy) 09/07/2022   Td 07/15/2004   Td (Adult), 2 Lf Tetanus Toxid, Preservative Free 07/15/2004   Td (Adult),5 Lf Tetanus Toxid, Preservative Free 07/15/2004   Tdap 08/20/2016   Unspecified SARS-COV-2 Vaccination 10/23/2021   Zoster Recombinant(Shingrix) 10/23/2019, 04/29/2020   Zoster, Live 12/19/2013  Zoster, Unspecified 10/23/2019, 04/29/2020    TDAP status: Up to date  Flu Vaccine status: Up to date  Pneumococcal vaccine status: Up to date  Covid-19 vaccine status: Completed vaccines  Qualifies for Shingles Vaccine? Yes   Zostavax completed No   Shingrix Completed?: Yes  Screening Tests Health Maintenance  Topic Date Due   FOOT EXAM  04/28/2017   OPHTHALMOLOGY EXAM  04/01/2023   COVID-19 Vaccine (7 - 2023-24 season) 09/14/2023 (Originally 08/14/2023)   HEMOGLOBIN A1C  02/19/2024   Medicare Annual Wellness (AWV)  08/28/2024   MAMMOGRAM  07/17/2025   DTaP/Tdap/Td (4 - Td or Tdap) 08/20/2026   Colonoscopy  03/11/2029   Pneumonia Vaccine 57+ Years old  Completed   INFLUENZA VACCINE  Completed   DEXA SCAN  Completed   Hepatitis C Screening  Completed   Zoster Vaccines- Shingrix  Completed   HPV VACCINES  Aged Out    Health Maintenance  Health Maintenance Due  Topic Date Due   FOOT EXAM  04/28/2017   OPHTHALMOLOGY EXAM  04/01/2023    Colorectal cancer screening: Type of screening: Colonoscopy. Completed 03/11/2022. Repeat every 7 years  Mammogram status: Completed 07/18/2023. Repeat every year  Bone Density status: Completed 04/05/2017. Results reflect: Bone density results: OSTEOPENIA. Repeat every N/A years.  Lung Cancer Screening: (Low Dose CT Chest recommended if Age 27-80 years, 20 pack-year currently smoking OR have quit w/in 15years.) does not qualify.   Lung Cancer Screening Referral: N/A  Additional  Screening:  Hepatitis C Screening: does qualify; Completed 03/09/2023  Vision Screening: Recommended annual ophthalmology exams for early detection of glaucoma and other disorders of the eye. Is the patient up to date with their annual eye exam?  Yes  Who is the provider or what is the name of the office in which the patient attends annual eye exams? Ginette Otto, OD If pt is not established with a provider, would they like to be referred to a provider to establish care? No .   Dental Screening: Recommended annual dental exams for proper oral hygiene  Diabetic Foot Exam: overdue  Community Resource Referral / Chronic Care Management: CRR required this visit?  No   CCM required this visit?  No     Plan:     I have personally reviewed and noted the following in the patient's chart:   Medical and social history Use of alcohol, tobacco or illicit drugs  Current medications and supplements including opioid prescriptions. Patient is currently taking opioid prescriptions. Information provided to patient regarding non-opioid alternatives. Patient advised to discuss non-opioid treatment plan with their provider. Functional ability and status Nutritional status Physical activity Advanced directives List of other physicians Hospitalizations, surgeries, and ER visits in previous 12 months Vitals Screenings to include cognitive, depression, and falls Referrals and appointments  In addition, I have reviewed and discussed with patient certain preventive protocols, quality metrics, and best practice recommendations. A written personalized care plan for preventive services as well as general preventive health recommendations were provided to patient.     Marinus Maw, CMA   08/29/2023   After Visit Summary: given to pt at end of visit  Nurse Notes: N/A  Medical screening examination/treatment/procedure(s) were performed by non-physician practitioner and as supervising physician I was  immediately available for consultation/collaboration.  I agree with above. Jacinta Shoe, MD

## 2023-08-30 DIAGNOSIS — N2581 Secondary hyperparathyroidism of renal origin: Secondary | ICD-10-CM | POA: Diagnosis not present

## 2023-08-30 DIAGNOSIS — R52 Pain, unspecified: Secondary | ICD-10-CM | POA: Diagnosis not present

## 2023-08-30 DIAGNOSIS — N186 End stage renal disease: Secondary | ICD-10-CM | POA: Diagnosis not present

## 2023-08-30 DIAGNOSIS — Z992 Dependence on renal dialysis: Secondary | ICD-10-CM | POA: Diagnosis not present

## 2023-08-30 DIAGNOSIS — D631 Anemia in chronic kidney disease: Secondary | ICD-10-CM | POA: Diagnosis not present

## 2023-08-30 DIAGNOSIS — R519 Headache, unspecified: Secondary | ICD-10-CM | POA: Diagnosis not present

## 2023-09-01 DIAGNOSIS — D631 Anemia in chronic kidney disease: Secondary | ICD-10-CM | POA: Diagnosis not present

## 2023-09-01 DIAGNOSIS — R52 Pain, unspecified: Secondary | ICD-10-CM | POA: Diagnosis not present

## 2023-09-01 DIAGNOSIS — N2581 Secondary hyperparathyroidism of renal origin: Secondary | ICD-10-CM | POA: Diagnosis not present

## 2023-09-01 DIAGNOSIS — N186 End stage renal disease: Secondary | ICD-10-CM | POA: Diagnosis not present

## 2023-09-01 DIAGNOSIS — R519 Headache, unspecified: Secondary | ICD-10-CM | POA: Diagnosis not present

## 2023-09-01 DIAGNOSIS — Z992 Dependence on renal dialysis: Secondary | ICD-10-CM | POA: Diagnosis not present

## 2023-09-03 ENCOUNTER — Other Ambulatory Visit: Payer: Self-pay | Admitting: Cardiovascular Disease

## 2023-09-03 DIAGNOSIS — N2581 Secondary hyperparathyroidism of renal origin: Secondary | ICD-10-CM | POA: Diagnosis not present

## 2023-09-03 DIAGNOSIS — R519 Headache, unspecified: Secondary | ICD-10-CM | POA: Diagnosis not present

## 2023-09-03 DIAGNOSIS — N186 End stage renal disease: Secondary | ICD-10-CM | POA: Diagnosis not present

## 2023-09-03 DIAGNOSIS — D631 Anemia in chronic kidney disease: Secondary | ICD-10-CM | POA: Diagnosis not present

## 2023-09-03 DIAGNOSIS — R52 Pain, unspecified: Secondary | ICD-10-CM | POA: Diagnosis not present

## 2023-09-03 DIAGNOSIS — Z992 Dependence on renal dialysis: Secondary | ICD-10-CM | POA: Diagnosis not present

## 2023-09-06 DIAGNOSIS — N186 End stage renal disease: Secondary | ICD-10-CM | POA: Diagnosis not present

## 2023-09-06 DIAGNOSIS — N2581 Secondary hyperparathyroidism of renal origin: Secondary | ICD-10-CM | POA: Diagnosis not present

## 2023-09-06 DIAGNOSIS — Z992 Dependence on renal dialysis: Secondary | ICD-10-CM | POA: Diagnosis not present

## 2023-09-06 DIAGNOSIS — R519 Headache, unspecified: Secondary | ICD-10-CM | POA: Diagnosis not present

## 2023-09-06 DIAGNOSIS — R52 Pain, unspecified: Secondary | ICD-10-CM | POA: Diagnosis not present

## 2023-09-06 DIAGNOSIS — D631 Anemia in chronic kidney disease: Secondary | ICD-10-CM | POA: Diagnosis not present

## 2023-09-07 ENCOUNTER — Other Ambulatory Visit (HOSPITAL_COMMUNITY): Payer: Self-pay

## 2023-09-07 MED ORDER — OXYCODONE HCL 15 MG PO TABS
15.0000 mg | ORAL_TABLET | Freq: Four times a day (QID) | ORAL | 0 refills | Status: DC | PRN
Start: 2023-09-07 — End: 2023-10-11
  Filled 2023-09-07: qty 120, 30d supply, fill #0

## 2023-09-08 ENCOUNTER — Other Ambulatory Visit (HOSPITAL_COMMUNITY): Payer: Self-pay

## 2023-09-08 DIAGNOSIS — Z992 Dependence on renal dialysis: Secondary | ICD-10-CM | POA: Diagnosis not present

## 2023-09-08 DIAGNOSIS — R519 Headache, unspecified: Secondary | ICD-10-CM | POA: Diagnosis not present

## 2023-09-08 DIAGNOSIS — N2581 Secondary hyperparathyroidism of renal origin: Secondary | ICD-10-CM | POA: Diagnosis not present

## 2023-09-08 DIAGNOSIS — R52 Pain, unspecified: Secondary | ICD-10-CM | POA: Diagnosis not present

## 2023-09-08 DIAGNOSIS — D631 Anemia in chronic kidney disease: Secondary | ICD-10-CM | POA: Diagnosis not present

## 2023-09-08 DIAGNOSIS — N186 End stage renal disease: Secondary | ICD-10-CM | POA: Diagnosis not present

## 2023-09-09 DIAGNOSIS — M79672 Pain in left foot: Secondary | ICD-10-CM | POA: Diagnosis not present

## 2023-09-09 DIAGNOSIS — S92352D Displaced fracture of fifth metatarsal bone, left foot, subsequent encounter for fracture with routine healing: Secondary | ICD-10-CM | POA: Diagnosis not present

## 2023-09-10 DIAGNOSIS — R519 Headache, unspecified: Secondary | ICD-10-CM | POA: Diagnosis not present

## 2023-09-10 DIAGNOSIS — Z992 Dependence on renal dialysis: Secondary | ICD-10-CM | POA: Diagnosis not present

## 2023-09-10 DIAGNOSIS — N186 End stage renal disease: Secondary | ICD-10-CM | POA: Diagnosis not present

## 2023-09-10 DIAGNOSIS — N2581 Secondary hyperparathyroidism of renal origin: Secondary | ICD-10-CM | POA: Diagnosis not present

## 2023-09-10 DIAGNOSIS — R52 Pain, unspecified: Secondary | ICD-10-CM | POA: Diagnosis not present

## 2023-09-10 DIAGNOSIS — D631 Anemia in chronic kidney disease: Secondary | ICD-10-CM | POA: Diagnosis not present

## 2023-09-12 DIAGNOSIS — N186 End stage renal disease: Secondary | ICD-10-CM | POA: Diagnosis not present

## 2023-09-12 DIAGNOSIS — Z992 Dependence on renal dialysis: Secondary | ICD-10-CM | POA: Diagnosis not present

## 2023-09-12 DIAGNOSIS — E1022 Type 1 diabetes mellitus with diabetic chronic kidney disease: Secondary | ICD-10-CM | POA: Diagnosis not present

## 2023-09-13 DIAGNOSIS — N2581 Secondary hyperparathyroidism of renal origin: Secondary | ICD-10-CM | POA: Diagnosis not present

## 2023-09-13 DIAGNOSIS — E1129 Type 2 diabetes mellitus with other diabetic kidney complication: Secondary | ICD-10-CM | POA: Diagnosis not present

## 2023-09-13 DIAGNOSIS — R52 Pain, unspecified: Secondary | ICD-10-CM | POA: Diagnosis not present

## 2023-09-13 DIAGNOSIS — Z992 Dependence on renal dialysis: Secondary | ICD-10-CM | POA: Diagnosis not present

## 2023-09-13 DIAGNOSIS — R519 Headache, unspecified: Secondary | ICD-10-CM | POA: Diagnosis not present

## 2023-09-13 DIAGNOSIS — N186 End stage renal disease: Secondary | ICD-10-CM | POA: Diagnosis not present

## 2023-09-13 DIAGNOSIS — D631 Anemia in chronic kidney disease: Secondary | ICD-10-CM | POA: Diagnosis not present

## 2023-09-13 DIAGNOSIS — G8929 Other chronic pain: Secondary | ICD-10-CM | POA: Diagnosis not present

## 2023-09-14 DIAGNOSIS — M6281 Muscle weakness (generalized): Secondary | ICD-10-CM | POA: Diagnosis not present

## 2023-09-14 DIAGNOSIS — S92341D Displaced fracture of fourth metatarsal bone, right foot, subsequent encounter for fracture with routine healing: Secondary | ICD-10-CM | POA: Diagnosis not present

## 2023-09-14 DIAGNOSIS — S92515D Nondisplaced fracture of proximal phalanx of left lesser toe(s), subsequent encounter for fracture with routine healing: Secondary | ICD-10-CM | POA: Diagnosis not present

## 2023-09-14 DIAGNOSIS — M25572 Pain in left ankle and joints of left foot: Secondary | ICD-10-CM | POA: Diagnosis not present

## 2023-09-15 DIAGNOSIS — G8929 Other chronic pain: Secondary | ICD-10-CM | POA: Diagnosis not present

## 2023-09-15 DIAGNOSIS — N186 End stage renal disease: Secondary | ICD-10-CM | POA: Diagnosis not present

## 2023-09-15 DIAGNOSIS — D631 Anemia in chronic kidney disease: Secondary | ICD-10-CM | POA: Diagnosis not present

## 2023-09-15 DIAGNOSIS — R52 Pain, unspecified: Secondary | ICD-10-CM | POA: Diagnosis not present

## 2023-09-15 DIAGNOSIS — N2581 Secondary hyperparathyroidism of renal origin: Secondary | ICD-10-CM | POA: Diagnosis not present

## 2023-09-15 DIAGNOSIS — E1129 Type 2 diabetes mellitus with other diabetic kidney complication: Secondary | ICD-10-CM | POA: Diagnosis not present

## 2023-09-15 DIAGNOSIS — R519 Headache, unspecified: Secondary | ICD-10-CM | POA: Diagnosis not present

## 2023-09-15 DIAGNOSIS — Z992 Dependence on renal dialysis: Secondary | ICD-10-CM | POA: Diagnosis not present

## 2023-09-17 DIAGNOSIS — D631 Anemia in chronic kidney disease: Secondary | ICD-10-CM | POA: Diagnosis not present

## 2023-09-17 DIAGNOSIS — N186 End stage renal disease: Secondary | ICD-10-CM | POA: Diagnosis not present

## 2023-09-17 DIAGNOSIS — R52 Pain, unspecified: Secondary | ICD-10-CM | POA: Diagnosis not present

## 2023-09-17 DIAGNOSIS — R519 Headache, unspecified: Secondary | ICD-10-CM | POA: Diagnosis not present

## 2023-09-17 DIAGNOSIS — E1129 Type 2 diabetes mellitus with other diabetic kidney complication: Secondary | ICD-10-CM | POA: Diagnosis not present

## 2023-09-17 DIAGNOSIS — N2581 Secondary hyperparathyroidism of renal origin: Secondary | ICD-10-CM | POA: Diagnosis not present

## 2023-09-17 DIAGNOSIS — G8929 Other chronic pain: Secondary | ICD-10-CM | POA: Diagnosis not present

## 2023-09-17 DIAGNOSIS — Z992 Dependence on renal dialysis: Secondary | ICD-10-CM | POA: Diagnosis not present

## 2023-09-19 DIAGNOSIS — S92515D Nondisplaced fracture of proximal phalanx of left lesser toe(s), subsequent encounter for fracture with routine healing: Secondary | ICD-10-CM | POA: Diagnosis not present

## 2023-09-19 DIAGNOSIS — M25572 Pain in left ankle and joints of left foot: Secondary | ICD-10-CM | POA: Diagnosis not present

## 2023-09-19 DIAGNOSIS — S92341D Displaced fracture of fourth metatarsal bone, right foot, subsequent encounter for fracture with routine healing: Secondary | ICD-10-CM | POA: Diagnosis not present

## 2023-09-19 DIAGNOSIS — M6281 Muscle weakness (generalized): Secondary | ICD-10-CM | POA: Diagnosis not present

## 2023-09-20 ENCOUNTER — Other Ambulatory Visit (HOSPITAL_COMMUNITY): Payer: Self-pay

## 2023-09-20 DIAGNOSIS — E1129 Type 2 diabetes mellitus with other diabetic kidney complication: Secondary | ICD-10-CM | POA: Diagnosis not present

## 2023-09-20 DIAGNOSIS — R519 Headache, unspecified: Secondary | ICD-10-CM | POA: Diagnosis not present

## 2023-09-20 DIAGNOSIS — G8929 Other chronic pain: Secondary | ICD-10-CM | POA: Diagnosis not present

## 2023-09-20 DIAGNOSIS — D631 Anemia in chronic kidney disease: Secondary | ICD-10-CM | POA: Diagnosis not present

## 2023-09-20 DIAGNOSIS — Z992 Dependence on renal dialysis: Secondary | ICD-10-CM | POA: Diagnosis not present

## 2023-09-20 DIAGNOSIS — N186 End stage renal disease: Secondary | ICD-10-CM | POA: Diagnosis not present

## 2023-09-20 DIAGNOSIS — R52 Pain, unspecified: Secondary | ICD-10-CM | POA: Diagnosis not present

## 2023-09-20 DIAGNOSIS — N2581 Secondary hyperparathyroidism of renal origin: Secondary | ICD-10-CM | POA: Diagnosis not present

## 2023-09-22 DIAGNOSIS — R519 Headache, unspecified: Secondary | ICD-10-CM | POA: Diagnosis not present

## 2023-09-22 DIAGNOSIS — N2581 Secondary hyperparathyroidism of renal origin: Secondary | ICD-10-CM | POA: Diagnosis not present

## 2023-09-22 DIAGNOSIS — N186 End stage renal disease: Secondary | ICD-10-CM | POA: Diagnosis not present

## 2023-09-22 DIAGNOSIS — R52 Pain, unspecified: Secondary | ICD-10-CM | POA: Diagnosis not present

## 2023-09-22 DIAGNOSIS — G8929 Other chronic pain: Secondary | ICD-10-CM | POA: Diagnosis not present

## 2023-09-22 DIAGNOSIS — D631 Anemia in chronic kidney disease: Secondary | ICD-10-CM | POA: Diagnosis not present

## 2023-09-22 DIAGNOSIS — Z992 Dependence on renal dialysis: Secondary | ICD-10-CM | POA: Diagnosis not present

## 2023-09-22 DIAGNOSIS — E1129 Type 2 diabetes mellitus with other diabetic kidney complication: Secondary | ICD-10-CM | POA: Diagnosis not present

## 2023-09-24 DIAGNOSIS — R52 Pain, unspecified: Secondary | ICD-10-CM | POA: Diagnosis not present

## 2023-09-24 DIAGNOSIS — D631 Anemia in chronic kidney disease: Secondary | ICD-10-CM | POA: Diagnosis not present

## 2023-09-24 DIAGNOSIS — E1129 Type 2 diabetes mellitus with other diabetic kidney complication: Secondary | ICD-10-CM | POA: Diagnosis not present

## 2023-09-24 DIAGNOSIS — Z992 Dependence on renal dialysis: Secondary | ICD-10-CM | POA: Diagnosis not present

## 2023-09-24 DIAGNOSIS — R519 Headache, unspecified: Secondary | ICD-10-CM | POA: Diagnosis not present

## 2023-09-24 DIAGNOSIS — G8929 Other chronic pain: Secondary | ICD-10-CM | POA: Diagnosis not present

## 2023-09-24 DIAGNOSIS — N186 End stage renal disease: Secondary | ICD-10-CM | POA: Diagnosis not present

## 2023-09-24 DIAGNOSIS — N2581 Secondary hyperparathyroidism of renal origin: Secondary | ICD-10-CM | POA: Diagnosis not present

## 2023-09-27 DIAGNOSIS — R52 Pain, unspecified: Secondary | ICD-10-CM | POA: Diagnosis not present

## 2023-09-27 DIAGNOSIS — N186 End stage renal disease: Secondary | ICD-10-CM | POA: Diagnosis not present

## 2023-09-27 DIAGNOSIS — N2581 Secondary hyperparathyroidism of renal origin: Secondary | ICD-10-CM | POA: Diagnosis not present

## 2023-09-27 DIAGNOSIS — D631 Anemia in chronic kidney disease: Secondary | ICD-10-CM | POA: Diagnosis not present

## 2023-09-27 DIAGNOSIS — E1129 Type 2 diabetes mellitus with other diabetic kidney complication: Secondary | ICD-10-CM | POA: Diagnosis not present

## 2023-09-27 DIAGNOSIS — G8929 Other chronic pain: Secondary | ICD-10-CM | POA: Diagnosis not present

## 2023-09-27 DIAGNOSIS — Z992 Dependence on renal dialysis: Secondary | ICD-10-CM | POA: Diagnosis not present

## 2023-09-27 DIAGNOSIS — R519 Headache, unspecified: Secondary | ICD-10-CM | POA: Diagnosis not present

## 2023-09-28 DIAGNOSIS — S92515D Nondisplaced fracture of proximal phalanx of left lesser toe(s), subsequent encounter for fracture with routine healing: Secondary | ICD-10-CM | POA: Diagnosis not present

## 2023-09-28 DIAGNOSIS — M25572 Pain in left ankle and joints of left foot: Secondary | ICD-10-CM | POA: Diagnosis not present

## 2023-09-28 DIAGNOSIS — M6281 Muscle weakness (generalized): Secondary | ICD-10-CM | POA: Diagnosis not present

## 2023-09-28 DIAGNOSIS — S92341D Displaced fracture of fourth metatarsal bone, right foot, subsequent encounter for fracture with routine healing: Secondary | ICD-10-CM | POA: Diagnosis not present

## 2023-09-29 DIAGNOSIS — Z992 Dependence on renal dialysis: Secondary | ICD-10-CM | POA: Diagnosis not present

## 2023-09-29 DIAGNOSIS — N186 End stage renal disease: Secondary | ICD-10-CM | POA: Diagnosis not present

## 2023-09-29 DIAGNOSIS — D631 Anemia in chronic kidney disease: Secondary | ICD-10-CM | POA: Diagnosis not present

## 2023-09-29 DIAGNOSIS — G8929 Other chronic pain: Secondary | ICD-10-CM | POA: Diagnosis not present

## 2023-09-29 DIAGNOSIS — N2581 Secondary hyperparathyroidism of renal origin: Secondary | ICD-10-CM | POA: Diagnosis not present

## 2023-09-29 DIAGNOSIS — R52 Pain, unspecified: Secondary | ICD-10-CM | POA: Diagnosis not present

## 2023-09-29 DIAGNOSIS — R519 Headache, unspecified: Secondary | ICD-10-CM | POA: Diagnosis not present

## 2023-09-29 DIAGNOSIS — E1129 Type 2 diabetes mellitus with other diabetic kidney complication: Secondary | ICD-10-CM | POA: Diagnosis not present

## 2023-10-01 DIAGNOSIS — N2581 Secondary hyperparathyroidism of renal origin: Secondary | ICD-10-CM | POA: Diagnosis not present

## 2023-10-01 DIAGNOSIS — R519 Headache, unspecified: Secondary | ICD-10-CM | POA: Diagnosis not present

## 2023-10-01 DIAGNOSIS — D631 Anemia in chronic kidney disease: Secondary | ICD-10-CM | POA: Diagnosis not present

## 2023-10-01 DIAGNOSIS — E1129 Type 2 diabetes mellitus with other diabetic kidney complication: Secondary | ICD-10-CM | POA: Diagnosis not present

## 2023-10-01 DIAGNOSIS — N186 End stage renal disease: Secondary | ICD-10-CM | POA: Diagnosis not present

## 2023-10-01 DIAGNOSIS — Z992 Dependence on renal dialysis: Secondary | ICD-10-CM | POA: Diagnosis not present

## 2023-10-01 DIAGNOSIS — G8929 Other chronic pain: Secondary | ICD-10-CM | POA: Diagnosis not present

## 2023-10-01 DIAGNOSIS — R52 Pain, unspecified: Secondary | ICD-10-CM | POA: Diagnosis not present

## 2023-10-03 DIAGNOSIS — S92515D Nondisplaced fracture of proximal phalanx of left lesser toe(s), subsequent encounter for fracture with routine healing: Secondary | ICD-10-CM | POA: Diagnosis not present

## 2023-10-03 DIAGNOSIS — M25572 Pain in left ankle and joints of left foot: Secondary | ICD-10-CM | POA: Diagnosis not present

## 2023-10-03 DIAGNOSIS — M6281 Muscle weakness (generalized): Secondary | ICD-10-CM | POA: Diagnosis not present

## 2023-10-03 DIAGNOSIS — S92341D Displaced fracture of fourth metatarsal bone, right foot, subsequent encounter for fracture with routine healing: Secondary | ICD-10-CM | POA: Diagnosis not present

## 2023-10-04 ENCOUNTER — Ambulatory Visit: Payer: Medicare Other | Admitting: Internal Medicine

## 2023-10-04 DIAGNOSIS — R519 Headache, unspecified: Secondary | ICD-10-CM | POA: Diagnosis not present

## 2023-10-04 DIAGNOSIS — N2581 Secondary hyperparathyroidism of renal origin: Secondary | ICD-10-CM | POA: Diagnosis not present

## 2023-10-04 DIAGNOSIS — D631 Anemia in chronic kidney disease: Secondary | ICD-10-CM | POA: Diagnosis not present

## 2023-10-04 DIAGNOSIS — N186 End stage renal disease: Secondary | ICD-10-CM | POA: Diagnosis not present

## 2023-10-04 DIAGNOSIS — G8929 Other chronic pain: Secondary | ICD-10-CM | POA: Diagnosis not present

## 2023-10-04 DIAGNOSIS — R52 Pain, unspecified: Secondary | ICD-10-CM | POA: Diagnosis not present

## 2023-10-04 DIAGNOSIS — E1129 Type 2 diabetes mellitus with other diabetic kidney complication: Secondary | ICD-10-CM | POA: Diagnosis not present

## 2023-10-04 DIAGNOSIS — Z992 Dependence on renal dialysis: Secondary | ICD-10-CM | POA: Diagnosis not present

## 2023-10-05 ENCOUNTER — Other Ambulatory Visit: Payer: Self-pay | Admitting: Internal Medicine

## 2023-10-05 ENCOUNTER — Other Ambulatory Visit (HOSPITAL_COMMUNITY): Payer: Self-pay

## 2023-10-06 ENCOUNTER — Other Ambulatory Visit (HOSPITAL_COMMUNITY): Payer: Self-pay

## 2023-10-06 DIAGNOSIS — D631 Anemia in chronic kidney disease: Secondary | ICD-10-CM | POA: Diagnosis not present

## 2023-10-06 DIAGNOSIS — N2581 Secondary hyperparathyroidism of renal origin: Secondary | ICD-10-CM | POA: Diagnosis not present

## 2023-10-06 DIAGNOSIS — R519 Headache, unspecified: Secondary | ICD-10-CM | POA: Diagnosis not present

## 2023-10-06 DIAGNOSIS — N186 End stage renal disease: Secondary | ICD-10-CM | POA: Diagnosis not present

## 2023-10-06 DIAGNOSIS — R52 Pain, unspecified: Secondary | ICD-10-CM | POA: Diagnosis not present

## 2023-10-06 DIAGNOSIS — E1129 Type 2 diabetes mellitus with other diabetic kidney complication: Secondary | ICD-10-CM | POA: Diagnosis not present

## 2023-10-06 DIAGNOSIS — G8929 Other chronic pain: Secondary | ICD-10-CM | POA: Diagnosis not present

## 2023-10-06 DIAGNOSIS — Z992 Dependence on renal dialysis: Secondary | ICD-10-CM | POA: Diagnosis not present

## 2023-10-06 MED ORDER — CYCLOBENZAPRINE HCL 10 MG PO TABS
10.0000 mg | ORAL_TABLET | Freq: Three times a day (TID) | ORAL | 0 refills | Status: DC | PRN
  Filled 2023-10-06: qty 270, 90d supply, fill #0

## 2023-10-08 DIAGNOSIS — Z992 Dependence on renal dialysis: Secondary | ICD-10-CM | POA: Diagnosis not present

## 2023-10-08 DIAGNOSIS — D631 Anemia in chronic kidney disease: Secondary | ICD-10-CM | POA: Diagnosis not present

## 2023-10-08 DIAGNOSIS — N2581 Secondary hyperparathyroidism of renal origin: Secondary | ICD-10-CM | POA: Diagnosis not present

## 2023-10-08 DIAGNOSIS — N186 End stage renal disease: Secondary | ICD-10-CM | POA: Diagnosis not present

## 2023-10-08 DIAGNOSIS — R519 Headache, unspecified: Secondary | ICD-10-CM | POA: Diagnosis not present

## 2023-10-08 DIAGNOSIS — G8929 Other chronic pain: Secondary | ICD-10-CM | POA: Diagnosis not present

## 2023-10-08 DIAGNOSIS — R52 Pain, unspecified: Secondary | ICD-10-CM | POA: Diagnosis not present

## 2023-10-08 DIAGNOSIS — E1129 Type 2 diabetes mellitus with other diabetic kidney complication: Secondary | ICD-10-CM | POA: Diagnosis not present

## 2023-10-11 ENCOUNTER — Other Ambulatory Visit (HOSPITAL_COMMUNITY): Payer: Self-pay

## 2023-10-11 DIAGNOSIS — R52 Pain, unspecified: Secondary | ICD-10-CM | POA: Diagnosis not present

## 2023-10-11 DIAGNOSIS — E1129 Type 2 diabetes mellitus with other diabetic kidney complication: Secondary | ICD-10-CM | POA: Diagnosis not present

## 2023-10-11 DIAGNOSIS — G8929 Other chronic pain: Secondary | ICD-10-CM | POA: Diagnosis not present

## 2023-10-11 DIAGNOSIS — Z992 Dependence on renal dialysis: Secondary | ICD-10-CM | POA: Diagnosis not present

## 2023-10-11 DIAGNOSIS — D631 Anemia in chronic kidney disease: Secondary | ICD-10-CM | POA: Diagnosis not present

## 2023-10-11 DIAGNOSIS — N186 End stage renal disease: Secondary | ICD-10-CM | POA: Diagnosis not present

## 2023-10-11 DIAGNOSIS — R519 Headache, unspecified: Secondary | ICD-10-CM | POA: Diagnosis not present

## 2023-10-11 DIAGNOSIS — N2581 Secondary hyperparathyroidism of renal origin: Secondary | ICD-10-CM | POA: Diagnosis not present

## 2023-10-11 MED ORDER — OXYCODONE HCL 15 MG PO TABS
15.0000 mg | ORAL_TABLET | Freq: Four times a day (QID) | ORAL | 0 refills | Status: DC | PRN
Start: 2023-10-11 — End: 2023-11-17
  Filled 2023-10-11: qty 120, 30d supply, fill #0

## 2023-10-13 DIAGNOSIS — R52 Pain, unspecified: Secondary | ICD-10-CM | POA: Diagnosis not present

## 2023-10-13 DIAGNOSIS — R519 Headache, unspecified: Secondary | ICD-10-CM | POA: Diagnosis not present

## 2023-10-13 DIAGNOSIS — G8929 Other chronic pain: Secondary | ICD-10-CM | POA: Diagnosis not present

## 2023-10-13 DIAGNOSIS — E1022 Type 1 diabetes mellitus with diabetic chronic kidney disease: Secondary | ICD-10-CM | POA: Diagnosis not present

## 2023-10-13 DIAGNOSIS — N186 End stage renal disease: Secondary | ICD-10-CM | POA: Diagnosis not present

## 2023-10-13 DIAGNOSIS — E1129 Type 2 diabetes mellitus with other diabetic kidney complication: Secondary | ICD-10-CM | POA: Diagnosis not present

## 2023-10-13 DIAGNOSIS — D631 Anemia in chronic kidney disease: Secondary | ICD-10-CM | POA: Diagnosis not present

## 2023-10-13 DIAGNOSIS — N2581 Secondary hyperparathyroidism of renal origin: Secondary | ICD-10-CM | POA: Diagnosis not present

## 2023-10-13 DIAGNOSIS — Z992 Dependence on renal dialysis: Secondary | ICD-10-CM | POA: Diagnosis not present

## 2023-10-15 DIAGNOSIS — R52 Pain, unspecified: Secondary | ICD-10-CM | POA: Diagnosis not present

## 2023-10-15 DIAGNOSIS — G8929 Other chronic pain: Secondary | ICD-10-CM | POA: Diagnosis not present

## 2023-10-15 DIAGNOSIS — D631 Anemia in chronic kidney disease: Secondary | ICD-10-CM | POA: Diagnosis not present

## 2023-10-15 DIAGNOSIS — N2581 Secondary hyperparathyroidism of renal origin: Secondary | ICD-10-CM | POA: Diagnosis not present

## 2023-10-15 DIAGNOSIS — N186 End stage renal disease: Secondary | ICD-10-CM | POA: Diagnosis not present

## 2023-10-15 DIAGNOSIS — Z992 Dependence on renal dialysis: Secondary | ICD-10-CM | POA: Diagnosis not present

## 2023-10-15 DIAGNOSIS — R519 Headache, unspecified: Secondary | ICD-10-CM | POA: Diagnosis not present

## 2023-10-17 DIAGNOSIS — M79672 Pain in left foot: Secondary | ICD-10-CM | POA: Diagnosis not present

## 2023-10-18 ENCOUNTER — Encounter: Payer: Self-pay | Admitting: Internal Medicine

## 2023-10-18 ENCOUNTER — Ambulatory Visit: Payer: Medicare Other | Admitting: Internal Medicine

## 2023-10-18 ENCOUNTER — Encounter (INDEPENDENT_AMBULATORY_CARE_PROVIDER_SITE_OTHER): Payer: Self-pay | Admitting: Otolaryngology

## 2023-10-18 VITALS — BP 112/70 | HR 81 | Temp 98.1°F | Ht 62.0 in | Wt 203.0 lb

## 2023-10-18 DIAGNOSIS — R519 Headache, unspecified: Secondary | ICD-10-CM | POA: Diagnosis not present

## 2023-10-18 DIAGNOSIS — R52 Pain, unspecified: Secondary | ICD-10-CM | POA: Diagnosis not present

## 2023-10-18 DIAGNOSIS — Z7984 Long term (current) use of oral hypoglycemic drugs: Secondary | ICD-10-CM | POA: Diagnosis not present

## 2023-10-18 DIAGNOSIS — E1129 Type 2 diabetes mellitus with other diabetic kidney complication: Secondary | ICD-10-CM

## 2023-10-18 DIAGNOSIS — Z992 Dependence on renal dialysis: Secondary | ICD-10-CM | POA: Diagnosis not present

## 2023-10-18 DIAGNOSIS — Z7985 Long-term (current) use of injectable non-insulin antidiabetic drugs: Secondary | ICD-10-CM | POA: Diagnosis not present

## 2023-10-18 DIAGNOSIS — M21372 Foot drop, left foot: Secondary | ICD-10-CM

## 2023-10-18 DIAGNOSIS — G8929 Other chronic pain: Secondary | ICD-10-CM | POA: Diagnosis not present

## 2023-10-18 DIAGNOSIS — R296 Repeated falls: Secondary | ICD-10-CM

## 2023-10-18 DIAGNOSIS — R499 Unspecified voice and resonance disorder: Secondary | ICD-10-CM | POA: Diagnosis not present

## 2023-10-18 DIAGNOSIS — N2581 Secondary hyperparathyroidism of renal origin: Secondary | ICD-10-CM | POA: Diagnosis not present

## 2023-10-18 DIAGNOSIS — I10 Essential (primary) hypertension: Secondary | ICD-10-CM | POA: Diagnosis not present

## 2023-10-18 DIAGNOSIS — F332 Major depressive disorder, recurrent severe without psychotic features: Secondary | ICD-10-CM | POA: Diagnosis not present

## 2023-10-18 DIAGNOSIS — D631 Anemia in chronic kidney disease: Secondary | ICD-10-CM | POA: Diagnosis not present

## 2023-10-18 DIAGNOSIS — D649 Anemia, unspecified: Secondary | ICD-10-CM | POA: Diagnosis not present

## 2023-10-18 DIAGNOSIS — L602 Onychogryphosis: Secondary | ICD-10-CM | POA: Diagnosis not present

## 2023-10-18 DIAGNOSIS — N186 End stage renal disease: Secondary | ICD-10-CM | POA: Diagnosis not present

## 2023-10-18 NOTE — Assessment & Plan Note (Signed)
Chronic On Wellbutrin XL, Cymbalta

## 2023-10-18 NOTE — Assessment & Plan Note (Addendum)
thick nails, B thumbs  - ref to Dr Wallace Cullens

## 2023-10-18 NOTE — Progress Notes (Addendum)
 Subjective:  Patient ID: [name], female    DOB: January 24, 1951  Age: 72 y.o. MRN: [id number]  CC: Medical Management of Chronic Issues (6 MNTH  F/U)   HPI [name] presents for DM, dry mouth Ozempic was $500 in May Pt had several falls - foot fx - Dr Janey Greaser C/o voice change x 6 months  Outpatient Medications Prior to Visit  Medication Sig Dispense Refill   Accu-Chek Softclix Lancets lancets Use 1 lancet up to 4 times daily as directed 100 each 0   acetaminophen (TYLENOL) 500 MG tablet Take 1,000 mg by mouth 2 (two) times daily as needed for headache.     aspirin-acetaminophen-caffeine (EXCEDRIN MIGRAINE) 250-250-65 MG tablet Take 1 tablet by mouth daily as needed for headache or migraine. Max 3 doses in one week     B Complex-C-Folic Acid (RENA-VITE PO) Take 1 tablet by mouth every morning.     blood glucose meter kit and supplies Dispense based on patient and insurance preference. Used to check blood sugar daily, DX: E11.9 OneTouch 1 each 0   Blood Glucose Monitoring Suppl (ACCU-CHEK GUIDE ME) w/Device KIT Use as directed to check blood sugar four times daily. 1 kit 0   buPROPion (WELLBUTRIN XL) 300 MG 24 hr tablet Take 1 tablet (300 mg total) by mouth in the morning. 90 tablet 1   carboxymethylcellulose (REFRESH PLUS) 0.5 % SOLN Place 1 drop into both eyes 3 (three) times daily as needed (dry eyes/irritation).     Evolocumab (REPATHA SURECLICK) 140 MG/ML SOAJ Inject 140 mg into the skin every 14 (fourteen) days. 2 mL 11   FLUoxetine (PROZAC) 40 MG capsule Take 1 capsule (40 mg total) by mouth in the morning. 90 capsule 1   glucose blood (ACCU-CHEK GUIDE) test strip Use to check blood sugar up to four times daily. 50 each 0   ketoconazole (NIZORAL) 2 % cream Apply 1 Application topically daily. (Patient taking differently: Apply 1 Application topically daily as needed for irritation.) 60 g 3   lanthanum (FOSRENOL) 1000 MG chewable tablet Chew 1,000-2,000 mg by mouth See admin  instructions. Taking 2000 mg with meals and 1000 mg  with snacks     lidocaine (LIDODERM) 5 % PLACE 2 PATCHES ONTO THE SKIN  DAILY (Patient taking differently: Place 2 patches onto the skin daily as needed (pain).) 120 patch 1   lidocaine (XYLOCAINE) 5 % ointment Apply 1 Application topically 3 (three) times daily as needed for moderate pain. 90 g 0   LORazepam (ATIVAN) 2 MG tablet Take 1 tablet (2 mg total) by mouth 2 (two) times daily as needed for anxiety. 60 tablet 5   Methoxy PEG-Epoetin Beta (MIRCERA IJ) Mircera     multivitamin-lutein (OCUVITE-LUTEIN) CAPS capsule Take 1 capsule by mouth every morning.     naloxone (NARCAN) nasal spray 4 mg/0.1 mL Take by nasal route every 3 minutes until patient awakes or EMS arrives. 2 each 0   ondansetron (ZOFRAN) 4 MG tablet Take 1 tablet (4 mg total) by mouth every 8 (eight) hours as needed for nausea or vomiting. (Patient not taking: Reported on 11/30/2023) 10 tablet 0   polyethylene glycol (MIRALAX / GLYCOLAX) 17 g packet Take 17 g by mouth daily as needed for moderate constipation.     PREVIDENT 5000 DRY MOUTH 1.1 % GEL dental gel Place 1 application  onto teeth in the morning and at bedtime.     Semaglutide,0.25 or 0.5MG /DOS, (OZEMPIC, 0.25 OR 0.5 MG/DOSE,) 2  MG/3ML SOPN INJECT SUBCUTANEOUSLY  0.5 MG WEEKLY 9 mL 3   buPROPion (WELLBUTRIN XL) 300 MG 24 hr tablet TAKE 1 TABLET BY MOUTH AT  BEDTIME (Patient not taking: Reported on 11/30/2023) 90 tablet 3   ciclopirox (PENLAC) 8 % solution Apply topically at bedtime. Apply over nail and surrounding skin daily 6.6 mL 1   cyclobenzaprine (FLEXERIL) 10 MG tablet Take 1 tablet (10 mg total) by mouth 3 (three) times daily as needed for muscle spasms. 270 tablet 0   ezetimibe (ZETIA) 10 MG tablet TAKE 1 TABLET BY MOUTH DAILY (Patient not taking: Reported on 11/30/2023) 100 tablet 2   FLUoxetine (PROZAC) 40 MG capsule Take 1 capsule (40 mg total) by mouth in the morning. (Patient not taking: Reported on  11/30/2023) 90 capsule 2   glimepiride (AMARYL) 1 MG tablet TAKE 1 TABLET BY MOUTH DAILY  WITH BREAKFAST 100 tablet 2   LORazepam (ATIVAN) 2 MG tablet Take 1 tablet (2 mg total) by mouth 2 (two) times daily as needed for anxiety (Patient not taking: Reported on 11/30/2023) 60 tablet 5   midodrine (PROAMATINE) 5 MG tablet Take 1-2 tablet by mouth three times a week as directed Take 30 minutes before dialysis and another tablet during middle of dialysis treatment 90 tablet 3   oxyCODONE (ROXICODONE) 15 MG immediate release tablet Take 1 tablet (15 mg total) by mouth 4 (four) times daily as needed. 120 tablet 0   oxyCODONE (ROXICODONE) 15 MG immediate release tablet Take 1 tablet (15 mg total) by mouth 4 (four) times daily as needed. 120 tablet 0   pantoprazole (PROTONIX) 40 MG tablet Take 1 tablet (40 mg total) by mouth 2 (two) times daily. 180 tablet 3   pilocarpine (SALAGEN) 5 MG tablet Take 1 tablet (5 mg total) by mouth 2 (two) times daily. 200 tablet 0   pioglitazone (ACTOS) 15 MG tablet Take 1 tablet (15 mg total) by mouth daily. 100 tablet 2   No facility-administered medications prior to visit.    ROS: Review of Systems  Constitutional:  Negative for activity change, appetite change, chills, fatigue and unexpected weight change.  HENT:  Negative for congestion, mouth sores and sinus pressure.   Eyes:  Negative for visual disturbance.  Respiratory:  Negative for cough and chest tightness.   Gastrointestinal:  Negative for abdominal pain and nausea.  Genitourinary:  Negative for difficulty urinating, frequency and vaginal pain.  Musculoskeletal:  Negative for back pain and gait problem.  Skin:  Negative for pallor and rash.  Neurological:  Negative for dizziness, tremors, weakness, numbness and headaches.  Psychiatric/Behavioral:  Negative for confusion, sleep disturbance and suicidal ideas. The patient is nervous/anxious.     Objective:  BP 112/70 (BP Location: Left Arm, Patient  Position: Sitting, Cuff Size: Normal)   Pulse 81   Temp 98.1 F (36.7 C) (Oral)   Ht 5\' 2"  (1.575 m)   Wt 203 lb (92.1 kg)   SpO2 94%   BMI 37.13 kg/m   BP Readings from Last 3 Encounters:  02/24/24 (!) 108/59  02/15/24 (!) 144/67  02/03/24 (!) 98/43    Wt Readings from Last 3 Encounters:  11/30/23 198 lb (89.8 kg)  10/18/23 203 lb (92.1 kg)  08/29/23 210 lb (95.3 kg)    Physical Exam Constitutional:      General: She is not in acute distress.    Appearance: She is well-developed. She is obese.  HENT:     Head: Normocephalic.  Right Ear: External ear normal.     Left Ear: External ear normal.     Nose: Nose normal.  Eyes:     General:        Right eye: No discharge.        Left eye: No discharge.     Conjunctiva/sclera: Conjunctivae normal.     Pupils: Pupils are equal, round, and reactive to light.  Neck:     Thyroid: No thyromegaly.     Vascular: No JVD.     Trachea: No tracheal deviation.  Cardiovascular:     Rate and Rhythm: Normal rate and regular rhythm.     Heart sounds: Normal heart sounds.  Pulmonary:     Effort: No respiratory distress.     Breath sounds: No stridor. No wheezing.  Abdominal:     General: Bowel sounds are normal. There is no distension.     Palpations: Abdomen is soft. There is no mass.     Tenderness: There is no abdominal tenderness. There is no guarding or rebound.  Musculoskeletal:        General: Tenderness present.     Cervical back: Normal range of motion and neck supple. No rigidity.  Lymphadenopathy:     Cervical: No cervical adenopathy.  Skin:    Findings: No erythema or rash.  Neurological:     Mental Status: She is oriented to person, place, and time.     Cranial Nerves: No cranial nerve deficit.     Motor: No abnormal muscle tone.     Coordination: Coordination normal.     Gait: Gait abnormal.     Deep Tendon Reflexes: Reflexes normal.  Psychiatric:        Behavior: Behavior normal.        Thought Content:  Thought content normal.        Judgment: Judgment normal.   Raspy voice thick nails, B thumbs L foot drop.    The patient requires a new L AFO due to improper fit of current thermoplastic AFO that is no longer appropriate due to fluctuating edema secondary to dialysis.  Lab Results  Component Value Date   WBC 12.3 (H) 03/09/2023   HGB 11.2 (L) 03/09/2023   HCT 34.3 (L) 03/09/2023   PLT 271.0 03/09/2023   GLUCOSE 148 (H) 08/22/2023   CHOL 129 08/22/2023   TRIG 207.0 (H) 08/22/2023   HDL 50.60 08/22/2023   LDLCALC 37 08/22/2023   ALT 13 08/22/2023   AST 16 08/22/2023   NA 136 08/22/2023   K 5.3 (H) 08/22/2023   CL 90 (L) 08/22/2023   CREATININE 5.83 (HH) 08/22/2023   BUN 49 (H) 08/22/2023   CO2 33 (H) 08/22/2023   TSH 3.41 08/22/2023   INR 0.9 03/09/2023   HGBA1C 6.5 08/22/2023    MM DIAG BREAST TOMO BILATERAL  Result Date: 07/18/2023 CLINICAL DATA:  One year follow-up of a probable benign mass in the upper aspect of the right breast originally seen on screening mammogram dated 05/14/2022. Six-month follow-up of probable fat necrosis in the upper outer quadrant of the right breast seen on diagnostic exam dated 01/17/2023. Patient had a recent fall with significant bruising to the right breast. EXAM: DIGITAL DIAGNOSTIC BILATERAL MAMMOGRAM WITH TOMOSYNTHESIS AND CAD; ULTRASOUND RIGHT BREAST LIMITED TECHNIQUE: Bilateral digital diagnostic mammography and breast tomosynthesis was performed. The images were evaluated with computer-aided detection. ; Targeted ultrasound examination of the right breast was performed COMPARISON:  Previous exam(s). ACR Breast Density Category b: There are  scattered areas of fibroglandular density. FINDINGS: The circumscribed mass in the upper aspect of the right breast with no sonographic correlate is stable. There is asymmetric fibroglandular tissue in the upper-outer quadrant of the right breast where the patient had traumatic injury to the breast from her  recent fall. This is the same area where the probable fat necrosis was visualized on the recent diagnostic exam dated 01/17/2023. No suspicious mass or malignant type microcalcifications identified in either breast. Targeted ultrasound is performed, showing diffuse hyperechogenicity with small cystic areas in the upper-outer quadrant of the right breast. This is likely fat necrosis from the recent injury. IMPRESSION: Probable benign fat necrosis in the upper-outer quadrant of the right breast. Stable circumscribed mass in the upper aspect of the right breast. RECOMMENDATION: Short-term interval follow-up right breast ultrasound in 6 months is recommended. I have discussed the findings and recommendations with the patient. If applicable, a reminder letter will be sent to the patient regarding the next appointment. BI-RADS CATEGORY  3: Probably benign. Electronically Signed   By: Baird Lyons M.D.   On: 07/18/2023 10:52   US BREAST LTD UNI RIGHT INC AXILLA  Result Date: 07/18/2023 CLINICAL DATA:  One year follow-up of a probable benign mass in the upper aspect of the right breast originally seen on screening mammogram dated 05/14/2022. Six-month follow-up of probable fat necrosis in the upper outer quadrant of the right breast seen on diagnostic exam dated 01/17/2023. Patient had a recent fall with significant bruising to the right breast. EXAM: DIGITAL DIAGNOSTIC BILATERAL MAMMOGRAM WITH TOMOSYNTHESIS AND CAD; ULTRASOUND RIGHT BREAST LIMITED TECHNIQUE: Bilateral digital diagnostic mammography and breast tomosynthesis was performed. The images were evaluated with computer-aided detection. ; Targeted ultrasound examination of the right breast was performed COMPARISON:  Previous exam(s). ACR Breast Density Category b: There are scattered areas of fibroglandular density. FINDINGS: The circumscribed mass in the upper aspect of the right breast with no sonographic correlate is stable. There is asymmetric fibroglandular  tissue in the upper-outer quadrant of the right breast where the patient had traumatic injury to the breast from her recent fall. This is the same area where the probable fat necrosis was visualized on the recent diagnostic exam dated 01/17/2023. No suspicious mass or malignant type microcalcifications identified in either breast. Targeted ultrasound is performed, showing diffuse hyperechogenicity with small cystic areas in the upper-outer quadrant of the right breast. This is likely fat necrosis from the recent injury. IMPRESSION: Probable benign fat necrosis in the upper-outer quadrant of the right breast. Stable circumscribed mass in the upper aspect of the right breast. RECOMMENDATION: Short-term interval follow-up right breast ultrasound in 6 months is recommended. I have discussed the findings and recommendations with the patient. If applicable, a reminder letter will be sent to the patient regarding the next appointment. BI-RADS CATEGORY  3: Probably benign. Electronically Signed   By: Baird Lyons M.D.   On: 07/18/2023 10:52    Assessment & Plan:   Problem List Items Addressed This Visit     Type 2 diabetes mellitus with other diabetic kidney complication ([name])   Ozempic was $500 in May - on 0.5 mg/wk On Actos, glimepiride A1c 6.3%      Anemia   On HD      Relevant Medications   Methoxy PEG-Epoetin Beta (MIRCERA IJ)   Depression   Chronic On Wellbutrin XL, Cymbalta      Essential hypertension   Low BP - on Midodrin on HD days  Falls   Pt fell x4 in the past 12 months Using a cane      Change of voice - Primary   C/o voice change x 6 months ENT ref      Relevant Orders   Ambulatory referral to ENT   Thickened nails   thick nails, B thumbs  - ref to Dr Wallace Cullens      Relevant Orders   Ambulatory referral to Dermatology   Foot drop, left   . The patient would benefit from an AFO order for L foot drop.           No orders of the defined types were placed in this  encounter.     Follow-up: Return in about 6 months (around 04/16/2024) for a follow-up visit.  Sonda Primes, MD

## 2023-10-18 NOTE — Assessment & Plan Note (Signed)
On HD 

## 2023-10-18 NOTE — Assessment & Plan Note (Signed)
Ozempic was $500 in May - on 0.5 mg/wk On Actos, glimepiride A1c 6.3%

## 2023-10-18 NOTE — Assessment & Plan Note (Signed)
Pt fell x4 in the past 12 months Using a cane

## 2023-10-18 NOTE — Assessment & Plan Note (Signed)
Low BP - on Midodrin on HD days

## 2023-10-18 NOTE — Assessment & Plan Note (Signed)
C/o voice change x 6 months ENT ref

## 2023-10-20 DIAGNOSIS — G8929 Other chronic pain: Secondary | ICD-10-CM | POA: Diagnosis not present

## 2023-10-20 DIAGNOSIS — N2581 Secondary hyperparathyroidism of renal origin: Secondary | ICD-10-CM | POA: Diagnosis not present

## 2023-10-20 DIAGNOSIS — D631 Anemia in chronic kidney disease: Secondary | ICD-10-CM | POA: Diagnosis not present

## 2023-10-20 DIAGNOSIS — Z992 Dependence on renal dialysis: Secondary | ICD-10-CM | POA: Diagnosis not present

## 2023-10-20 DIAGNOSIS — N186 End stage renal disease: Secondary | ICD-10-CM | POA: Diagnosis not present

## 2023-10-20 DIAGNOSIS — R52 Pain, unspecified: Secondary | ICD-10-CM | POA: Diagnosis not present

## 2023-10-20 DIAGNOSIS — R519 Headache, unspecified: Secondary | ICD-10-CM | POA: Diagnosis not present

## 2023-10-22 DIAGNOSIS — G8929 Other chronic pain: Secondary | ICD-10-CM | POA: Diagnosis not present

## 2023-10-22 DIAGNOSIS — N2581 Secondary hyperparathyroidism of renal origin: Secondary | ICD-10-CM | POA: Diagnosis not present

## 2023-10-22 DIAGNOSIS — R519 Headache, unspecified: Secondary | ICD-10-CM | POA: Diagnosis not present

## 2023-10-22 DIAGNOSIS — R52 Pain, unspecified: Secondary | ICD-10-CM | POA: Diagnosis not present

## 2023-10-22 DIAGNOSIS — Z992 Dependence on renal dialysis: Secondary | ICD-10-CM | POA: Diagnosis not present

## 2023-10-22 DIAGNOSIS — D631 Anemia in chronic kidney disease: Secondary | ICD-10-CM | POA: Diagnosis not present

## 2023-10-22 DIAGNOSIS — N186 End stage renal disease: Secondary | ICD-10-CM | POA: Diagnosis not present

## 2023-10-25 DIAGNOSIS — Z992 Dependence on renal dialysis: Secondary | ICD-10-CM | POA: Diagnosis not present

## 2023-10-25 DIAGNOSIS — D631 Anemia in chronic kidney disease: Secondary | ICD-10-CM | POA: Diagnosis not present

## 2023-10-25 DIAGNOSIS — R519 Headache, unspecified: Secondary | ICD-10-CM | POA: Diagnosis not present

## 2023-10-25 DIAGNOSIS — R52 Pain, unspecified: Secondary | ICD-10-CM | POA: Diagnosis not present

## 2023-10-25 DIAGNOSIS — N2581 Secondary hyperparathyroidism of renal origin: Secondary | ICD-10-CM | POA: Diagnosis not present

## 2023-10-25 DIAGNOSIS — N186 End stage renal disease: Secondary | ICD-10-CM | POA: Diagnosis not present

## 2023-10-25 DIAGNOSIS — G8929 Other chronic pain: Secondary | ICD-10-CM | POA: Diagnosis not present

## 2023-10-27 DIAGNOSIS — N2581 Secondary hyperparathyroidism of renal origin: Secondary | ICD-10-CM | POA: Diagnosis not present

## 2023-10-27 DIAGNOSIS — G8929 Other chronic pain: Secondary | ICD-10-CM | POA: Diagnosis not present

## 2023-10-27 DIAGNOSIS — Z992 Dependence on renal dialysis: Secondary | ICD-10-CM | POA: Diagnosis not present

## 2023-10-27 DIAGNOSIS — R519 Headache, unspecified: Secondary | ICD-10-CM | POA: Diagnosis not present

## 2023-10-27 DIAGNOSIS — D631 Anemia in chronic kidney disease: Secondary | ICD-10-CM | POA: Diagnosis not present

## 2023-10-27 DIAGNOSIS — N186 End stage renal disease: Secondary | ICD-10-CM | POA: Diagnosis not present

## 2023-10-27 DIAGNOSIS — R52 Pain, unspecified: Secondary | ICD-10-CM | POA: Diagnosis not present

## 2023-10-29 DIAGNOSIS — N186 End stage renal disease: Secondary | ICD-10-CM | POA: Diagnosis not present

## 2023-10-29 DIAGNOSIS — G8929 Other chronic pain: Secondary | ICD-10-CM | POA: Diagnosis not present

## 2023-10-29 DIAGNOSIS — Z992 Dependence on renal dialysis: Secondary | ICD-10-CM | POA: Diagnosis not present

## 2023-10-29 DIAGNOSIS — R519 Headache, unspecified: Secondary | ICD-10-CM | POA: Diagnosis not present

## 2023-10-29 DIAGNOSIS — D631 Anemia in chronic kidney disease: Secondary | ICD-10-CM | POA: Diagnosis not present

## 2023-10-29 DIAGNOSIS — R52 Pain, unspecified: Secondary | ICD-10-CM | POA: Diagnosis not present

## 2023-10-29 DIAGNOSIS — N2581 Secondary hyperparathyroidism of renal origin: Secondary | ICD-10-CM | POA: Diagnosis not present

## 2023-11-01 DIAGNOSIS — M25521 Pain in right elbow: Secondary | ICD-10-CM | POA: Diagnosis not present

## 2023-11-01 DIAGNOSIS — Z992 Dependence on renal dialysis: Secondary | ICD-10-CM | POA: Diagnosis not present

## 2023-11-01 DIAGNOSIS — R519 Headache, unspecified: Secondary | ICD-10-CM | POA: Diagnosis not present

## 2023-11-01 DIAGNOSIS — N2581 Secondary hyperparathyroidism of renal origin: Secondary | ICD-10-CM | POA: Diagnosis not present

## 2023-11-01 DIAGNOSIS — G8929 Other chronic pain: Secondary | ICD-10-CM | POA: Diagnosis not present

## 2023-11-01 DIAGNOSIS — R52 Pain, unspecified: Secondary | ICD-10-CM | POA: Diagnosis not present

## 2023-11-01 DIAGNOSIS — N186 End stage renal disease: Secondary | ICD-10-CM | POA: Diagnosis not present

## 2023-11-01 DIAGNOSIS — D631 Anemia in chronic kidney disease: Secondary | ICD-10-CM | POA: Diagnosis not present

## 2023-11-02 ENCOUNTER — Other Ambulatory Visit (HOSPITAL_COMMUNITY): Payer: Self-pay

## 2023-11-03 DIAGNOSIS — R519 Headache, unspecified: Secondary | ICD-10-CM | POA: Diagnosis not present

## 2023-11-03 DIAGNOSIS — N186 End stage renal disease: Secondary | ICD-10-CM | POA: Diagnosis not present

## 2023-11-03 DIAGNOSIS — Z992 Dependence on renal dialysis: Secondary | ICD-10-CM | POA: Diagnosis not present

## 2023-11-03 DIAGNOSIS — D631 Anemia in chronic kidney disease: Secondary | ICD-10-CM | POA: Diagnosis not present

## 2023-11-03 DIAGNOSIS — N2581 Secondary hyperparathyroidism of renal origin: Secondary | ICD-10-CM | POA: Diagnosis not present

## 2023-11-03 DIAGNOSIS — G8929 Other chronic pain: Secondary | ICD-10-CM | POA: Diagnosis not present

## 2023-11-03 DIAGNOSIS — R52 Pain, unspecified: Secondary | ICD-10-CM | POA: Diagnosis not present

## 2023-11-04 DIAGNOSIS — S42431A Displaced fracture (avulsion) of lateral epicondyle of right humerus, initial encounter for closed fracture: Secondary | ICD-10-CM | POA: Diagnosis not present

## 2023-11-05 DIAGNOSIS — N2581 Secondary hyperparathyroidism of renal origin: Secondary | ICD-10-CM | POA: Diagnosis not present

## 2023-11-05 DIAGNOSIS — N186 End stage renal disease: Secondary | ICD-10-CM | POA: Diagnosis not present

## 2023-11-05 DIAGNOSIS — Z992 Dependence on renal dialysis: Secondary | ICD-10-CM | POA: Diagnosis not present

## 2023-11-05 DIAGNOSIS — G8929 Other chronic pain: Secondary | ICD-10-CM | POA: Diagnosis not present

## 2023-11-05 DIAGNOSIS — D631 Anemia in chronic kidney disease: Secondary | ICD-10-CM | POA: Diagnosis not present

## 2023-11-05 DIAGNOSIS — R519 Headache, unspecified: Secondary | ICD-10-CM | POA: Diagnosis not present

## 2023-11-05 DIAGNOSIS — R52 Pain, unspecified: Secondary | ICD-10-CM | POA: Diagnosis not present

## 2023-11-07 DIAGNOSIS — Z992 Dependence on renal dialysis: Secondary | ICD-10-CM | POA: Diagnosis not present

## 2023-11-07 DIAGNOSIS — N186 End stage renal disease: Secondary | ICD-10-CM | POA: Diagnosis not present

## 2023-11-07 DIAGNOSIS — R519 Headache, unspecified: Secondary | ICD-10-CM | POA: Diagnosis not present

## 2023-11-07 DIAGNOSIS — M25521 Pain in right elbow: Secondary | ICD-10-CM | POA: Diagnosis not present

## 2023-11-07 DIAGNOSIS — R52 Pain, unspecified: Secondary | ICD-10-CM | POA: Diagnosis not present

## 2023-11-07 DIAGNOSIS — N2581 Secondary hyperparathyroidism of renal origin: Secondary | ICD-10-CM | POA: Diagnosis not present

## 2023-11-07 DIAGNOSIS — G8929 Other chronic pain: Secondary | ICD-10-CM | POA: Diagnosis not present

## 2023-11-07 DIAGNOSIS — S42431D Displaced fracture (avulsion) of lateral epicondyle of right humerus, subsequent encounter for fracture with routine healing: Secondary | ICD-10-CM | POA: Diagnosis not present

## 2023-11-07 DIAGNOSIS — D631 Anemia in chronic kidney disease: Secondary | ICD-10-CM | POA: Diagnosis not present

## 2023-11-09 ENCOUNTER — Other Ambulatory Visit: Payer: Self-pay | Admitting: Internal Medicine

## 2023-11-09 DIAGNOSIS — N2581 Secondary hyperparathyroidism of renal origin: Secondary | ICD-10-CM | POA: Diagnosis not present

## 2023-11-09 DIAGNOSIS — N186 End stage renal disease: Secondary | ICD-10-CM | POA: Diagnosis not present

## 2023-11-09 DIAGNOSIS — Z992 Dependence on renal dialysis: Secondary | ICD-10-CM | POA: Diagnosis not present

## 2023-11-12 DIAGNOSIS — E1022 Type 1 diabetes mellitus with diabetic chronic kidney disease: Secondary | ICD-10-CM | POA: Diagnosis not present

## 2023-11-12 DIAGNOSIS — Z992 Dependence on renal dialysis: Secondary | ICD-10-CM | POA: Diagnosis not present

## 2023-11-12 DIAGNOSIS — N2581 Secondary hyperparathyroidism of renal origin: Secondary | ICD-10-CM | POA: Diagnosis not present

## 2023-11-12 DIAGNOSIS — N186 End stage renal disease: Secondary | ICD-10-CM | POA: Diagnosis not present

## 2023-11-15 DIAGNOSIS — M25521 Pain in right elbow: Secondary | ICD-10-CM | POA: Diagnosis not present

## 2023-11-15 DIAGNOSIS — S42431D Displaced fracture (avulsion) of lateral epicondyle of right humerus, subsequent encounter for fracture with routine healing: Secondary | ICD-10-CM | POA: Diagnosis not present

## 2023-11-15 DIAGNOSIS — R519 Headache, unspecified: Secondary | ICD-10-CM | POA: Diagnosis not present

## 2023-11-15 DIAGNOSIS — R52 Pain, unspecified: Secondary | ICD-10-CM | POA: Diagnosis not present

## 2023-11-15 DIAGNOSIS — D631 Anemia in chronic kidney disease: Secondary | ICD-10-CM | POA: Diagnosis not present

## 2023-11-15 DIAGNOSIS — Z992 Dependence on renal dialysis: Secondary | ICD-10-CM | POA: Diagnosis not present

## 2023-11-15 DIAGNOSIS — N2581 Secondary hyperparathyroidism of renal origin: Secondary | ICD-10-CM | POA: Diagnosis not present

## 2023-11-15 DIAGNOSIS — N186 End stage renal disease: Secondary | ICD-10-CM | POA: Diagnosis not present

## 2023-11-15 DIAGNOSIS — S42433A Displaced fracture (avulsion) of lateral epicondyle of unspecified humerus, initial encounter for closed fracture: Secondary | ICD-10-CM | POA: Insufficient documentation

## 2023-11-16 ENCOUNTER — Other Ambulatory Visit (HOSPITAL_COMMUNITY): Payer: Self-pay

## 2023-11-17 ENCOUNTER — Other Ambulatory Visit (HOSPITAL_COMMUNITY): Payer: Self-pay

## 2023-11-17 DIAGNOSIS — N2581 Secondary hyperparathyroidism of renal origin: Secondary | ICD-10-CM | POA: Diagnosis not present

## 2023-11-17 DIAGNOSIS — Z992 Dependence on renal dialysis: Secondary | ICD-10-CM | POA: Diagnosis not present

## 2023-11-17 DIAGNOSIS — R519 Headache, unspecified: Secondary | ICD-10-CM | POA: Diagnosis not present

## 2023-11-17 DIAGNOSIS — D631 Anemia in chronic kidney disease: Secondary | ICD-10-CM | POA: Diagnosis not present

## 2023-11-17 DIAGNOSIS — R52 Pain, unspecified: Secondary | ICD-10-CM | POA: Diagnosis not present

## 2023-11-17 DIAGNOSIS — N186 End stage renal disease: Secondary | ICD-10-CM | POA: Diagnosis not present

## 2023-11-17 MED ORDER — OXYCODONE HCL 15 MG PO TABS
15.0000 mg | ORAL_TABLET | Freq: Four times a day (QID) | ORAL | 0 refills | Status: DC | PRN
  Filled 2023-11-17: qty 120, 30d supply, fill #0

## 2023-11-19 DIAGNOSIS — N2581 Secondary hyperparathyroidism of renal origin: Secondary | ICD-10-CM | POA: Diagnosis not present

## 2023-11-19 DIAGNOSIS — N186 End stage renal disease: Secondary | ICD-10-CM | POA: Diagnosis not present

## 2023-11-19 DIAGNOSIS — R519 Headache, unspecified: Secondary | ICD-10-CM | POA: Diagnosis not present

## 2023-11-19 DIAGNOSIS — D631 Anemia in chronic kidney disease: Secondary | ICD-10-CM | POA: Diagnosis not present

## 2023-11-19 DIAGNOSIS — R52 Pain, unspecified: Secondary | ICD-10-CM | POA: Diagnosis not present

## 2023-11-19 DIAGNOSIS — Z992 Dependence on renal dialysis: Secondary | ICD-10-CM | POA: Diagnosis not present

## 2023-11-21 ENCOUNTER — Other Ambulatory Visit (HOSPITAL_COMMUNITY): Payer: Self-pay

## 2023-11-21 MED ORDER — MIDODRINE HCL 10 MG PO TABS
30.0000 mg | ORAL_TABLET | ORAL | 3 refills | Status: DC
  Filled 2023-11-21 (×2): qty 144, 84d supply, fill #0
  Filled ????-??-??: fill #0

## 2023-11-22 DIAGNOSIS — D631 Anemia in chronic kidney disease: Secondary | ICD-10-CM | POA: Diagnosis not present

## 2023-11-22 DIAGNOSIS — R519 Headache, unspecified: Secondary | ICD-10-CM | POA: Diagnosis not present

## 2023-11-22 DIAGNOSIS — R52 Pain, unspecified: Secondary | ICD-10-CM | POA: Diagnosis not present

## 2023-11-22 DIAGNOSIS — N186 End stage renal disease: Secondary | ICD-10-CM | POA: Diagnosis not present

## 2023-11-22 DIAGNOSIS — Z992 Dependence on renal dialysis: Secondary | ICD-10-CM | POA: Diagnosis not present

## 2023-11-22 DIAGNOSIS — N2581 Secondary hyperparathyroidism of renal origin: Secondary | ICD-10-CM | POA: Diagnosis not present

## 2023-11-22 DIAGNOSIS — T782XXA Anaphylactic shock, unspecified, initial encounter: Secondary | ICD-10-CM | POA: Insufficient documentation

## 2023-11-24 DIAGNOSIS — D631 Anemia in chronic kidney disease: Secondary | ICD-10-CM | POA: Diagnosis not present

## 2023-11-24 DIAGNOSIS — N2581 Secondary hyperparathyroidism of renal origin: Secondary | ICD-10-CM | POA: Diagnosis not present

## 2023-11-24 DIAGNOSIS — Z992 Dependence on renal dialysis: Secondary | ICD-10-CM | POA: Diagnosis not present

## 2023-11-24 DIAGNOSIS — R519 Headache, unspecified: Secondary | ICD-10-CM | POA: Diagnosis not present

## 2023-11-24 DIAGNOSIS — N186 End stage renal disease: Secondary | ICD-10-CM | POA: Diagnosis not present

## 2023-11-24 DIAGNOSIS — R52 Pain, unspecified: Secondary | ICD-10-CM | POA: Diagnosis not present

## 2023-11-26 DIAGNOSIS — R52 Pain, unspecified: Secondary | ICD-10-CM | POA: Diagnosis not present

## 2023-11-26 DIAGNOSIS — N2581 Secondary hyperparathyroidism of renal origin: Secondary | ICD-10-CM | POA: Diagnosis not present

## 2023-11-26 DIAGNOSIS — Z992 Dependence on renal dialysis: Secondary | ICD-10-CM | POA: Diagnosis not present

## 2023-11-26 DIAGNOSIS — D631 Anemia in chronic kidney disease: Secondary | ICD-10-CM | POA: Diagnosis not present

## 2023-11-26 DIAGNOSIS — N186 End stage renal disease: Secondary | ICD-10-CM | POA: Diagnosis not present

## 2023-11-26 DIAGNOSIS — R519 Headache, unspecified: Secondary | ICD-10-CM | POA: Diagnosis not present

## 2023-11-29 DIAGNOSIS — Z992 Dependence on renal dialysis: Secondary | ICD-10-CM | POA: Diagnosis not present

## 2023-11-29 DIAGNOSIS — N186 End stage renal disease: Secondary | ICD-10-CM | POA: Diagnosis not present

## 2023-11-29 DIAGNOSIS — R519 Headache, unspecified: Secondary | ICD-10-CM | POA: Diagnosis not present

## 2023-11-29 DIAGNOSIS — N2581 Secondary hyperparathyroidism of renal origin: Secondary | ICD-10-CM | POA: Diagnosis not present

## 2023-11-29 DIAGNOSIS — R52 Pain, unspecified: Secondary | ICD-10-CM | POA: Diagnosis not present

## 2023-11-29 DIAGNOSIS — D631 Anemia in chronic kidney disease: Secondary | ICD-10-CM | POA: Diagnosis not present

## 2023-11-30 ENCOUNTER — Ambulatory Visit (INDEPENDENT_AMBULATORY_CARE_PROVIDER_SITE_OTHER): Payer: Medicare Other | Admitting: Otolaryngology

## 2023-11-30 ENCOUNTER — Encounter (INDEPENDENT_AMBULATORY_CARE_PROVIDER_SITE_OTHER): Payer: Self-pay | Admitting: Otolaryngology

## 2023-11-30 VITALS — BP 162/77 | HR 105 | Ht 62.25 in | Wt 198.0 lb

## 2023-11-30 DIAGNOSIS — N189 Chronic kidney disease, unspecified: Secondary | ICD-10-CM | POA: Diagnosis not present

## 2023-11-30 DIAGNOSIS — J383 Other diseases of vocal cords: Secondary | ICD-10-CM

## 2023-11-30 DIAGNOSIS — J3089 Other allergic rhinitis: Secondary | ICD-10-CM | POA: Diagnosis not present

## 2023-11-30 DIAGNOSIS — J343 Hypertrophy of nasal turbinates: Secondary | ICD-10-CM

## 2023-11-30 DIAGNOSIS — R0981 Nasal congestion: Secondary | ICD-10-CM | POA: Diagnosis not present

## 2023-11-30 DIAGNOSIS — J3489 Other specified disorders of nose and nasal sinuses: Secondary | ICD-10-CM | POA: Diagnosis not present

## 2023-11-30 DIAGNOSIS — K219 Gastro-esophageal reflux disease without esophagitis: Secondary | ICD-10-CM

## 2023-11-30 DIAGNOSIS — R682 Dry mouth, unspecified: Secondary | ICD-10-CM

## 2023-11-30 DIAGNOSIS — Z992 Dependence on renal dialysis: Secondary | ICD-10-CM | POA: Diagnosis not present

## 2023-11-30 DIAGNOSIS — R490 Dysphonia: Secondary | ICD-10-CM | POA: Diagnosis not present

## 2023-11-30 DIAGNOSIS — R0982 Postnasal drip: Secondary | ICD-10-CM | POA: Diagnosis not present

## 2023-11-30 DIAGNOSIS — J342 Deviated nasal septum: Secondary | ICD-10-CM | POA: Diagnosis not present

## 2023-11-30 MED ORDER — AZELASTINE HCL 0.1 % NA SOLN: 2.0000 | Freq: Two times a day (BID) | NASAL | 12 refills | Status: DC

## 2023-11-30 MED ORDER — FLUTICASONE PROPIONATE 50 MCG/ACT NA SUSP: 2.0000 | Freq: Every day | NASAL | 6 refills | Status: AC

## 2023-11-30 NOTE — Patient Instructions (Signed)

## 2023-11-30 NOTE — Progress Notes (Signed)
ENT CONSULT:  Reason for Consult: chronic dysphonia since Spring 2024   HPI: Discussed the use of AI scribe software for clinical note transcription with the patient, who gave verbal consent to proceed.  History of Present Illness   The patient is a 72 yoF retired [name] fund[employer], with a history of diabetes, kidney disease requiring dialysis, and a past surgical history significant for brain surgery, presents with an intermittent raspy voice and dry mouth for several months (since Spring of 2024). The voice changes, which have been present for several months, are more pronounced with prolonged talking. The patient also reports a sensation of postnasal drainage and frequent mouth breathing, particularly at night, which she believes contributes to her dry mouth. She has been on pilocarpine for dry mouth in the past, but reports minimal relief.  The patient also has a history of heartburn, managed with twice-daily Protonix 40 mg, which she reports helps control her symptoms. She has a history of smoking, but quit in 1990. She reports occasional coughing, which she attributes to postnasal drainage, but denies any shortness of breath or swallowing difficulties.  The patient also reports a history of joint pain, for which she took NSAIDs daily for an extended period. She believes this, in combination with her diabetes, contributed to her current kidney disease. She also reports neuropathy in her feet, which has led to multiple falls and fractures.  The patient underwent septoplasty in the 1970s and sinus surgery in the late 1980s, but reports persistent nasal congestion/nasal obstruction and postnasal drainage. 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     Records Reviewed:  PCP office visit Dr Posey Rea 10/18/23 Type 2 diabetes mellitus with other diabetic kidney complication ([name])       Ozempic was $500 in May - on 0.5 mg/wk On Actos,  glimepiride A1c 6.3%        Anemia      On HD        Relevant Medications    Methoxy PEG-Epoetin Beta (MIRCERA IJ)    Depression      Chronic On Wellbutrin XL, Cymbalta        Essential hypertension      Low BP - on Midodrin on HD days        Falls      Pt fell x4 in the past 12 months Using a cane        Change of voice - Primary      C/o voice change x 6 months ENT ref        Relevant Orders    Ambulatory referral to ENT     Past Medical History:  Diagnosis Date   Anemia    Anxiety    Brain tumor (benign) ([name])    Colitis 2010   microscopic- Dr Marina Goodell   Depression    Diabetes mellitus    type II   Dyspnea    with exertion   ESRD (end stage renal disease) ([name])    TTUSAT [address]    Fibromyalgia    GERD (gastroesophageal reflux disease)    Headache    History of blood transfusion    after knee surgery   Hypertension    discontinued all diuretics and antihypertensives   IBS (irritable bowel syndrome)    LBP (low back pain)    Neuropathy    feet bilat    Osteoarthritis    Osteopenia    Pneumonia  hx of 2014    Rotator cuff tear, right    Sinusitis    currently being treated with antibiotic will complete 03/04/2015    Past Surgical History:  Procedure Laterality Date   AV FISTULA PLACEMENT Right 09/12/2020   Procedure: RIGHT ARTERIOVENOUS (AV) FISTULA CREATION;  Surgeon: Sherren Kerns, MD;  Location: [facility] OR;  Service: Vascular;  Laterality: Right;   BASCILIC VEIN TRANSPOSITION Right 10/27/2020   Procedure: RIGHT UPPER EXTREMITY SECOND STAGE BASCILIC VEIN TRANSPOSITION;  Surgeon: Sherren Kerns, MD;  Location: [facility] OR;  Service: Vascular;  Laterality: Right;   BIOPSY  03/11/2022   Procedure: BIOPSY;  Surgeon: Lemar Lofty., MD;  Location[facility] ENDOSCOPY;  Service: Gastroenterology;;   COLONOSCOPY WITH PROPOFOL N/A 03/11/2022   Procedure: COLONOSCOPY WITH PROPOFOL;  Surgeon: Lemar Lofty., MD;  Location: [facility] ENDOSCOPY;   Service: Gastroenterology;  Laterality: N/A;   foramen magnum ependymoma surgery  2003   Dr Jule Ser   JOINT REPLACEMENT Bilateral    NASAL SINUS SURGERY     1973    REVERSE SHOULDER ARTHROPLASTY Right 06/03/2022   Procedure: REVERSE SHOULDER ARTHROPLASTY;  Surgeon: Francena Hanly, MD;  Location: [facility] ORS;  Service: Orthopedics;  Laterality: Right;    TONSILLECTOMY     TOTAL KNEE ARTHROPLASTY     L 2008, R 2009, R 2016- Dr Despina Hick   TOTAL KNEE REVISION Right 03/05/2015   Procedure: RIGHT TOTAL KNEE ARTHROPLASTY REVISION;  Surgeon: Ollen Gross, MD;  Location: [facility] ORS;  Service: Orthopedics;  Laterality: Right;    Family History  Problem Relation Age of Onset   Depression Mother    Hypertension Mother    Stroke Mother 50   Diabetes Mother    Diabetes Father    Hyperlipidemia Father    Hypertension Father    Crohn's disease Maternal Uncle    Cancer Brother        Prostate   Diabetes Brother    Heart disease Brother    Hyperlipidemia Brother    Hypertension Brother    Diabetes Brother    Heart disease Brother        Heart Disease before age 73   Heart attack Brother    Stroke Brother        X's 2   Hyperlipidemia Brother    Hypertension Brother    Diabetes Other    Colon cancer Neg Hx    Esophageal cancer Neg Hx    Stomach cancer Neg Hx    Pancreatic cancer Neg Hx    Breast cancer Neg Hx     Social History:  reports that she quit smoking about 34 years ago. Her smoking use included cigarettes. She started smoking about 55 years ago. She has a 20 pack-year smoking history. She has never used smokeless tobacco. She reports that she does not currently use alcohol. She reports that she does not use drugs.  Allergies:  Allergies  Allergen Reactions   Erythromycin Diarrhea and Nausea And Vomiting   Gabapentin Other (See Comments)    Confusion and falling  Hallucinations    Medications: I have reviewed the patient's current medications.  The PMH, PSH, Medications,  Allergies, and SH were reviewed and updated.  ROS: Constitutional: Negative for fever, weight loss and weight gain. Cardiovascular: Negative for chest pain and dyspnea on exertion. Respiratory: Is not experiencing shortness of breath at rest. Gastrointestinal: Negative for nausea and vomiting. Neurological: Negative for headaches. Psychiatric: The patient is not nervous/anxious  Blood pressure (!) 162/77,  pulse (!) 105, height 5' 2.25" (1.581 m), weight 198 lb (89.8 kg), SpO2 94%.  PHYSICAL EXAM:  Exam: General: Well-developed, well-nourished Communication and Voice: raspy Respiratory Respiratory effort: Equal inspiration and expiration without stridor Cardiovascular Peripheral Vascular: Warm extremities with equal color/perfusion Eyes: No nystagmus with equal extraocular motion bilaterally Neuro/Psych/Balance: Patient oriented to person, place, and time; Appropriate mood and affect; Gait is intact with no imbalance; Cranial nerves I-XII are intact Head and Face Inspection: Normocephalic and atraumatic without mass or lesion Palpation: Facial skeleton intact without bony stepoffs Salivary Glands: No mass or tenderness Facial Strength: Facial motility symmetric and full bilaterally ENT Pinna: External ear intact and fully developed External canal: Canal is patent with intact skin Tympanic Membrane: Clear and mobile External Nose: No scar or anatomic deformity Internal Nose: Septum is deviated with S-shaped septum and narrowing of b/l nasal passages worse on the left. No polyp, or purulence. Mucosal edema and erythema present.  Bilateral inferior turbinate hypertrophy.  Lips, Teeth, and gums: Mucosa and teeth intact and viable TMJ: No pain to palpation with full mobility Oral cavity/oropharynx: No erythema or exudate, no lesions present Nasopharynx: No mass or lesion with intact mucosa Hypopharynx: Intact mucosa without pooling of secretions Larynx Glottic: Full true vocal cord  mobility without lesion or mass, VF atrophy and glottic insufficiency Supraglottic: Normal appearing epiglottis and AE folds Interarytenoid Space: Moderate to severe pachydermia&edema Subglottic Space: Patent without lesion or edema Neck Neck and Trachea: Midline trachea without mass or lesion Thyroid: No mass or nodularity Lymphatics: No lymphadenopathy  Procedure:   Preoperative diagnosis: hoarseness  Postoperative diagnosis:   same + VF atrophy and glottic insufficiency + GERD LPR + post-nasal drip  Procedure: Flexible fiberoptic laryngoscopy with stroboscopy (16109)  Surgeon: Ashok Croon, MD  Anesthesia: Topical lidocaine and Afrin  Complications: None  Condition is stable throughout exam  Indications and consent:   The patient presents to the clinic with hoarseness. All the risks, benefits, and potential complications were reviewed with the patient preoperatively and informed verbal consent was obtained.  Procedure: The patient was seated upright in the exam chair.   Topical lidocaine and Afrin were applied to the nasal cavity. After adequate anesthesia had occurred, the flexible telescope was passed into the nasal cavity. The nasopharynx was patent without mass or lesion. The scope was passed behind the soft palate and directed toward the base of tongue. The base of tongue was visualized and was symmetric with no apparent masses or abnormal appearing tissue. There were no signs of a mass or pooling of secretions in the piriform sinuses. The supraglottic structures were normal.  The true vocal cords are mobile and atrophic. The medial edges were bowed. Closure was incomplete. Periodicity present. The mucosal wave and amplitude were intact and symmetric. There is severe interarytenoid pachydermia and post cricoid edema. The mucosa appears without lesions. The laryngoscope was then slowly withdrawn and the patient tolerated the procedure well. There were no complications or blood  loss.  Studies Reviewed: 06/10/23 IMPRESSION: 1. Right upper chest wall, upper breast subcutaneous contusion and hematoma. No sternal fracture, but subtle bilateral anterior rib fractures, 3 through 5. 2. No other acute traumatic injury identified in the noncontrast Chest. 3.  Aortic Atherosclerosis (ICD10-I70.0).  CXR sternum 06/08/23 EXAM: STERNUM - 2+ VIEW   COMPARISON:  Chest radiograph dated 06/07/2023.   FINDINGS: There is no evidence of fracture or other focal bone lesions.   IMPRESSION: Negative.    Assessment/Plan: Encounter Diagnoses  Name Primary?  Dysphonia Yes   Glottic insufficiency    Age-related vocal fold atrophy    Chronic GERD without esophagitis    Environmental and seasonal allergies    Chronic nasal congestion    Nasal obstruction    Nasal septal deviation    Hypertrophy of both inferior nasal turbinates    Dry mouth     Assessment and Plan   Chronic dysphonia Chronic hoarseness for over 6 months with intermittent worsening of sx, exacerbated by talking. Likely multifactorial, and due to vocal fold atrophy seen on strobe exam today and GERD LPR. Examination also revealed post-nasal drainage and dry mouth thick secretions, patient is on restricted fluid intake 2/2 CKD being on dialysis and not making urine any more. Discussed conservative management with voice therapy. - Refer to speech therapist for voice therapy - Continue Protonix 40 mg twice daily for GERD - Provide information on diet and lifestyle modifications for reflux - Recommend Reflux Gourmet post-meals - Prescribe Astelin 2 puffs b/l nares and Flonase 2 puffs b/l nares twice daily, spaced half an hour apart for post-nasal drainage - will hold off on antihistamine 2/2 falls and dry mouth  Chronic Nasal Congestion and Postnasal Drip Chronic nasal congestion and postnasal drip, with a history of septoplasty and sinus surgery. Examination showed tight nasal passages with S-shaped septum  and clear post-nasal drainage, nasal mucosal edema. Discussed nasal spray use. - Prescribe Astelin and Flonase nasal sprays, twice daily, spaced half an hour apart  Chronic Dry Mouth Chronic dry mouth, likely exacerbated by mouth breathing 2/2 nasal congestion and limited fluid intake due to dialysis. Pilocarpine has been ineffective. Discussed using lemon-flavored lozenges to stimulate saliva production. - Recommend lemon-flavored lozenges to stimulate saliva production  Chronic Kidney Disease on Dialysis Chronic kidney disease requiring dialysis, with associated fluid intake restrictions. Discussed impact on dry mouth and overall management. 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 No current neurological symptoms reported.  Follow-up - Schedule follow-up appointment in a few months after speech therapy and trial of nasal sprays.      Thank you for allowing me to participate in the care of this patient. Please do not hesitate to contact me with any questions or concerns.   Ashok Croon, MD Otolaryngology [facility] Phone: [phone] Fax: [phone]    11/30/2023, 9:06 PM

## 2023-12-01 DIAGNOSIS — D631 Anemia in chronic kidney disease: Secondary | ICD-10-CM | POA: Diagnosis not present

## 2023-12-01 DIAGNOSIS — N2581 Secondary hyperparathyroidism of renal origin: Secondary | ICD-10-CM | POA: Diagnosis not present

## 2023-12-01 DIAGNOSIS — R52 Pain, unspecified: Secondary | ICD-10-CM | POA: Diagnosis not present

## 2023-12-01 DIAGNOSIS — N186 End stage renal disease: Secondary | ICD-10-CM | POA: Diagnosis not present

## 2023-12-01 DIAGNOSIS — Z992 Dependence on renal dialysis: Secondary | ICD-10-CM | POA: Diagnosis not present

## 2023-12-01 DIAGNOSIS — R519 Headache, unspecified: Secondary | ICD-10-CM | POA: Diagnosis not present

## 2023-12-03 DIAGNOSIS — R52 Pain, unspecified: Secondary | ICD-10-CM | POA: Diagnosis not present

## 2023-12-03 DIAGNOSIS — Z992 Dependence on renal dialysis: Secondary | ICD-10-CM | POA: Diagnosis not present

## 2023-12-03 DIAGNOSIS — N2581 Secondary hyperparathyroidism of renal origin: Secondary | ICD-10-CM | POA: Diagnosis not present

## 2023-12-03 DIAGNOSIS — D631 Anemia in chronic kidney disease: Secondary | ICD-10-CM | POA: Diagnosis not present

## 2023-12-03 DIAGNOSIS — N186 End stage renal disease: Secondary | ICD-10-CM | POA: Diagnosis not present

## 2023-12-03 DIAGNOSIS — R519 Headache, unspecified: Secondary | ICD-10-CM | POA: Diagnosis not present

## 2023-12-05 DIAGNOSIS — N186 End stage renal disease: Secondary | ICD-10-CM | POA: Diagnosis not present

## 2023-12-05 DIAGNOSIS — N2581 Secondary hyperparathyroidism of renal origin: Secondary | ICD-10-CM | POA: Diagnosis not present

## 2023-12-05 DIAGNOSIS — Z992 Dependence on renal dialysis: Secondary | ICD-10-CM | POA: Diagnosis not present

## 2023-12-08 DIAGNOSIS — N186 End stage renal disease: Secondary | ICD-10-CM | POA: Diagnosis not present

## 2023-12-08 DIAGNOSIS — Z992 Dependence on renal dialysis: Secondary | ICD-10-CM | POA: Diagnosis not present

## 2023-12-08 DIAGNOSIS — N2581 Secondary hyperparathyroidism of renal origin: Secondary | ICD-10-CM | POA: Diagnosis not present

## 2023-12-10 DIAGNOSIS — R519 Headache, unspecified: Secondary | ICD-10-CM | POA: Diagnosis not present

## 2023-12-10 DIAGNOSIS — Z992 Dependence on renal dialysis: Secondary | ICD-10-CM | POA: Diagnosis not present

## 2023-12-10 DIAGNOSIS — N2581 Secondary hyperparathyroidism of renal origin: Secondary | ICD-10-CM | POA: Diagnosis not present

## 2023-12-10 DIAGNOSIS — D631 Anemia in chronic kidney disease: Secondary | ICD-10-CM | POA: Diagnosis not present

## 2023-12-10 DIAGNOSIS — R52 Pain, unspecified: Secondary | ICD-10-CM | POA: Diagnosis not present

## 2023-12-10 DIAGNOSIS — N186 End stage renal disease: Secondary | ICD-10-CM | POA: Diagnosis not present

## 2023-12-12 DIAGNOSIS — N2581 Secondary hyperparathyroidism of renal origin: Secondary | ICD-10-CM | POA: Diagnosis not present

## 2023-12-12 DIAGNOSIS — D631 Anemia in chronic kidney disease: Secondary | ICD-10-CM | POA: Diagnosis not present

## 2023-12-12 DIAGNOSIS — N186 End stage renal disease: Secondary | ICD-10-CM | POA: Diagnosis not present

## 2023-12-12 DIAGNOSIS — R52 Pain, unspecified: Secondary | ICD-10-CM | POA: Diagnosis not present

## 2023-12-12 DIAGNOSIS — L570 Actinic keratosis: Secondary | ICD-10-CM | POA: Diagnosis not present

## 2023-12-12 DIAGNOSIS — L821 Other seborrheic keratosis: Secondary | ICD-10-CM | POA: Diagnosis not present

## 2023-12-12 DIAGNOSIS — R519 Headache, unspecified: Secondary | ICD-10-CM | POA: Diagnosis not present

## 2023-12-12 DIAGNOSIS — Z7189 Other specified counseling: Secondary | ICD-10-CM | POA: Diagnosis not present

## 2023-12-12 DIAGNOSIS — D225 Melanocytic nevi of trunk: Secondary | ICD-10-CM | POA: Diagnosis not present

## 2023-12-12 DIAGNOSIS — B351 Tinea unguium: Secondary | ICD-10-CM | POA: Diagnosis not present

## 2023-12-12 DIAGNOSIS — L814 Other melanin hyperpigmentation: Secondary | ICD-10-CM | POA: Diagnosis not present

## 2023-12-12 DIAGNOSIS — X32XXXA Exposure to sunlight, initial encounter: Secondary | ICD-10-CM | POA: Diagnosis not present

## 2023-12-12 DIAGNOSIS — L603 Nail dystrophy: Secondary | ICD-10-CM | POA: Diagnosis not present

## 2023-12-12 DIAGNOSIS — Z992 Dependence on renal dialysis: Secondary | ICD-10-CM | POA: Diagnosis not present

## 2023-12-13 ENCOUNTER — Other Ambulatory Visit: Payer: Self-pay | Admitting: Internal Medicine

## 2023-12-13 DIAGNOSIS — Z992 Dependence on renal dialysis: Secondary | ICD-10-CM | POA: Diagnosis not present

## 2023-12-13 DIAGNOSIS — E1022 Type 1 diabetes mellitus with diabetic chronic kidney disease: Secondary | ICD-10-CM | POA: Diagnosis not present

## 2023-12-13 DIAGNOSIS — N186 End stage renal disease: Secondary | ICD-10-CM | POA: Diagnosis not present

## 2023-12-15 DIAGNOSIS — E1129 Type 2 diabetes mellitus with other diabetic kidney complication: Secondary | ICD-10-CM | POA: Diagnosis not present

## 2023-12-15 DIAGNOSIS — G8929 Other chronic pain: Secondary | ICD-10-CM | POA: Diagnosis not present

## 2023-12-15 DIAGNOSIS — R52 Pain, unspecified: Secondary | ICD-10-CM | POA: Diagnosis not present

## 2023-12-15 DIAGNOSIS — N186 End stage renal disease: Secondary | ICD-10-CM | POA: Diagnosis not present

## 2023-12-15 DIAGNOSIS — N2581 Secondary hyperparathyroidism of renal origin: Secondary | ICD-10-CM | POA: Diagnosis not present

## 2023-12-15 DIAGNOSIS — Z992 Dependence on renal dialysis: Secondary | ICD-10-CM | POA: Diagnosis not present

## 2023-12-15 DIAGNOSIS — D631 Anemia in chronic kidney disease: Secondary | ICD-10-CM | POA: Diagnosis not present

## 2023-12-16 ENCOUNTER — Encounter: Payer: Self-pay | Admitting: Speech Pathology

## 2023-12-16 ENCOUNTER — Ambulatory Visit: Payer: Medicare Other | Attending: Otolaryngology | Admitting: Speech Pathology

## 2023-12-16 DIAGNOSIS — J383 Other diseases of vocal cords: Secondary | ICD-10-CM | POA: Insufficient documentation

## 2023-12-16 DIAGNOSIS — R490 Dysphonia: Secondary | ICD-10-CM | POA: Insufficient documentation
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
## 2023-12-17 DIAGNOSIS — Z992 Dependence on renal dialysis: Secondary | ICD-10-CM | POA: Diagnosis not present

## 2023-12-17 DIAGNOSIS — D631 Anemia in chronic kidney disease: Secondary | ICD-10-CM | POA: Diagnosis not present

## 2023-12-17 DIAGNOSIS — G8929 Other chronic pain: Secondary | ICD-10-CM | POA: Diagnosis not present

## 2023-12-17 DIAGNOSIS — N186 End stage renal disease: Secondary | ICD-10-CM | POA: Diagnosis not present

## 2023-12-17 DIAGNOSIS — R52 Pain, unspecified: Secondary | ICD-10-CM | POA: Diagnosis not present

## 2023-12-17 DIAGNOSIS — E1129 Type 2 diabetes mellitus with other diabetic kidney complication: Secondary | ICD-10-CM | POA: Diagnosis not present

## 2023-12-17 DIAGNOSIS — N2581 Secondary hyperparathyroidism of renal origin: Secondary | ICD-10-CM | POA: Diagnosis not present

## 2023-12-19 ENCOUNTER — Ambulatory Visit: Payer: Medicare Other

## 2023-12-19 DIAGNOSIS — S42431A Displaced fracture (avulsion) of lateral epicondyle of right humerus, initial encounter for closed fracture: Secondary | ICD-10-CM | POA: Diagnosis not present
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
## 2023-12-20 DIAGNOSIS — Z992 Dependence on renal dialysis: Secondary | ICD-10-CM | POA: Diagnosis not present

## 2023-12-20 DIAGNOSIS — R52 Pain, unspecified: Secondary | ICD-10-CM | POA: Diagnosis not present

## 2023-12-20 DIAGNOSIS — G8929 Other chronic pain: Secondary | ICD-10-CM | POA: Diagnosis not present

## 2023-12-20 DIAGNOSIS — E1129 Type 2 diabetes mellitus with other diabetic kidney complication: Secondary | ICD-10-CM | POA: Diagnosis not present

## 2023-12-20 DIAGNOSIS — D631 Anemia in chronic kidney disease: Secondary | ICD-10-CM | POA: Diagnosis not present

## 2023-12-20 DIAGNOSIS — N2581 Secondary hyperparathyroidism of renal origin: Secondary | ICD-10-CM | POA: Diagnosis not present

## 2023-12-20 DIAGNOSIS — N186 End stage renal disease: Secondary | ICD-10-CM | POA: Diagnosis not present
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
## 2023-12-21 ENCOUNTER — Other Ambulatory Visit: Payer: Self-pay | Admitting: Internal Medicine

## 2023-12-21 ENCOUNTER — Ambulatory Visit: Payer: Medicare Other

## 2023-12-21 DIAGNOSIS — M25561 Pain in right knee: Secondary | ICD-10-CM

## 2023-12-21 DIAGNOSIS — R296 Repeated falls: Secondary | ICD-10-CM

## 2023-12-22 DIAGNOSIS — G8929 Other chronic pain: Secondary | ICD-10-CM | POA: Diagnosis not present

## 2023-12-22 DIAGNOSIS — N186 End stage renal disease: Secondary | ICD-10-CM | POA: Diagnosis not present

## 2023-12-22 DIAGNOSIS — N2581 Secondary hyperparathyroidism of renal origin: Secondary | ICD-10-CM | POA: Diagnosis not present

## 2023-12-22 DIAGNOSIS — Z992 Dependence on renal dialysis: Secondary | ICD-10-CM | POA: Diagnosis not present

## 2023-12-22 DIAGNOSIS — D631 Anemia in chronic kidney disease: Secondary | ICD-10-CM | POA: Diagnosis not present

## 2023-12-22 DIAGNOSIS — E1129 Type 2 diabetes mellitus with other diabetic kidney complication: Secondary | ICD-10-CM | POA: Diagnosis not present

## 2023-12-22 DIAGNOSIS — R52 Pain, unspecified: Secondary | ICD-10-CM | POA: Diagnosis not present

## 2023-12-23 ENCOUNTER — Other Ambulatory Visit (HOSPITAL_COMMUNITY): Payer: Self-pay

## 2023-12-23 MED ORDER — OXYCODONE HCL 15 MG PO TABS
15.0000 mg | ORAL_TABLET | Freq: Four times a day (QID) | ORAL | 0 refills | Status: DC | PRN
  Filled 2023-12-23: qty 120, 30d supply, fill #0

## 2023-12-24 DIAGNOSIS — E1129 Type 2 diabetes mellitus with other diabetic kidney complication: Secondary | ICD-10-CM | POA: Diagnosis not present

## 2023-12-24 DIAGNOSIS — R52 Pain, unspecified: Secondary | ICD-10-CM | POA: Diagnosis not present

## 2023-12-24 DIAGNOSIS — Z992 Dependence on renal dialysis: Secondary | ICD-10-CM | POA: Diagnosis not present

## 2023-12-24 DIAGNOSIS — N186 End stage renal disease: Secondary | ICD-10-CM | POA: Diagnosis not present

## 2023-12-24 DIAGNOSIS — N2581 Secondary hyperparathyroidism of renal origin: Secondary | ICD-10-CM | POA: Diagnosis not present

## 2023-12-24 DIAGNOSIS — G8929 Other chronic pain: Secondary | ICD-10-CM | POA: Diagnosis not present

## 2023-12-24 DIAGNOSIS — D631 Anemia in chronic kidney disease: Secondary | ICD-10-CM | POA: Diagnosis not present

## 2023-12-27 ENCOUNTER — Other Ambulatory Visit (HOSPITAL_COMMUNITY): Payer: Self-pay

## 2023-12-27 DIAGNOSIS — N186 End stage renal disease: Secondary | ICD-10-CM | POA: Diagnosis not present

## 2023-12-27 DIAGNOSIS — D631 Anemia in chronic kidney disease: Secondary | ICD-10-CM | POA: Diagnosis not present

## 2023-12-27 DIAGNOSIS — Z992 Dependence on renal dialysis: Secondary | ICD-10-CM | POA: Diagnosis not present

## 2023-12-27 DIAGNOSIS — N2581 Secondary hyperparathyroidism of renal origin: Secondary | ICD-10-CM | POA: Diagnosis not present

## 2023-12-27 DIAGNOSIS — G8929 Other chronic pain: Secondary | ICD-10-CM | POA: Diagnosis not present

## 2023-12-27 DIAGNOSIS — R52 Pain, unspecified: Secondary | ICD-10-CM | POA: Diagnosis not present

## 2023-12-27 DIAGNOSIS — E1129 Type 2 diabetes mellitus with other diabetic kidney complication: Secondary | ICD-10-CM | POA: Diagnosis not present
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
## 2023-12-28 ENCOUNTER — Ambulatory Visit: Payer: Medicare Other

## 2023-12-28 DIAGNOSIS — E1140 Type 2 diabetes mellitus with diabetic neuropathy, unspecified: Secondary | ICD-10-CM | POA: Diagnosis not present

## 2023-12-28 DIAGNOSIS — R490 Dysphonia: Secondary | ICD-10-CM

## 2023-12-28 DIAGNOSIS — M542 Cervicalgia: Secondary | ICD-10-CM | POA: Diagnosis not present

## 2023-12-28 DIAGNOSIS — J383 Other diseases of vocal cords: Secondary | ICD-10-CM | POA: Diagnosis not present

## 2023-12-28 DIAGNOSIS — M4716 Other spondylosis with myelopathy, lumbar region: Secondary | ICD-10-CM | POA: Diagnosis not present

## 2023-12-28 DIAGNOSIS — R296 Repeated falls: Secondary | ICD-10-CM | POA: Diagnosis not present

## 2023-12-28 NOTE — Patient Instructions (Signed)
 Semi-occluded vocal tract exercises (SOVTE)  These allow your vocal folds to vibrate without excess tension and promotes high placement of the voice  Use SOVTE as a warm up before prolonged speaking and vocal exercises  Watch Vocal Straw Exercises with Lenox Ahr on YouTube: [email or web address]  Make sure your lips are rounded and sealed around straw   Exercises: (2-3x each exercise)  Blow air through straw Hum through straw  Hum (low to high pitch) through straw Hum (high to low pitch) through straw   Hum "[employer]" through [employer] "happy birthday" through [employer] air through straw into water  Hum through straw into water  Hum (low to high pitch) through straw into water  Hum (high to low pitch) through straw into water   Hum "[employer]" through straw into water   Hum "happy birthday" through straw into water   A goal would be 2-3 minutes several times a day and prior to vocal exercises  As always, use good belly breathing while completing SOVTE

## 2023-12-29 DIAGNOSIS — N2581 Secondary hyperparathyroidism of renal origin: Secondary | ICD-10-CM | POA: Diagnosis not present

## 2023-12-29 DIAGNOSIS — Z992 Dependence on renal dialysis: Secondary | ICD-10-CM | POA: Diagnosis not present

## 2023-12-29 DIAGNOSIS — G8929 Other chronic pain: Secondary | ICD-10-CM | POA: Diagnosis not present

## 2023-12-29 DIAGNOSIS — R52 Pain, unspecified: Secondary | ICD-10-CM | POA: Diagnosis not present

## 2023-12-29 DIAGNOSIS — D631 Anemia in chronic kidney disease: Secondary | ICD-10-CM | POA: Diagnosis not present

## 2023-12-29 DIAGNOSIS — E1129 Type 2 diabetes mellitus with other diabetic kidney complication: Secondary | ICD-10-CM | POA: Diagnosis not present

## 2023-12-29 DIAGNOSIS — N186 End stage renal disease: Secondary | ICD-10-CM | POA: Diagnosis not present

## 2023-12-30 ENCOUNTER — Ambulatory Visit: Payer: Medicare Other | Admitting: Speech Pathology

## 2023-12-30 ENCOUNTER — Telehealth: Payer: Self-pay | Admitting: Speech Pathology

## 2023-12-30 NOTE — Telephone Encounter (Signed)
SLP contacted pt via telephone d/t missed visit for today's 10:15 appointment. This is pt's third missed visit this episode. Advised pt of no-show policy. Pt reports she does intend to continue with ST but morning appointments do not work. SLP offered to reschedule x1 visit for PM slot, if pt attends, further appointments may be scheduled. Pt is in agreement. ST appointment scheduled for 01/17/2024 at 1315.   [name], SLP  12/30/2023 11:38

## 2023-12-31 DIAGNOSIS — G8929 Other chronic pain: Secondary | ICD-10-CM | POA: Diagnosis not present

## 2023-12-31 DIAGNOSIS — N186 End stage renal disease: Secondary | ICD-10-CM | POA: Diagnosis not present

## 2023-12-31 DIAGNOSIS — E1129 Type 2 diabetes mellitus with other diabetic kidney complication: Secondary | ICD-10-CM | POA: Diagnosis not present

## 2023-12-31 DIAGNOSIS — R52 Pain, unspecified: Secondary | ICD-10-CM | POA: Diagnosis not present

## 2023-12-31 DIAGNOSIS — D631 Anemia in chronic kidney disease: Secondary | ICD-10-CM | POA: Diagnosis not present

## 2023-12-31 DIAGNOSIS — N2581 Secondary hyperparathyroidism of renal origin: Secondary | ICD-10-CM | POA: Diagnosis not present

## 2023-12-31 DIAGNOSIS — Z992 Dependence on renal dialysis: Secondary | ICD-10-CM | POA: Diagnosis not present

## 2024-01-01 ENCOUNTER — Other Ambulatory Visit (HOSPITAL_COMMUNITY): Payer: Self-pay

## 2024-01-01 ENCOUNTER — Other Ambulatory Visit: Payer: Self-pay | Admitting: Internal Medicine

## 2024-01-03 ENCOUNTER — Other Ambulatory Visit (HOSPITAL_COMMUNITY): Payer: Self-pay

## 2024-01-03 DIAGNOSIS — Z992 Dependence on renal dialysis: Secondary | ICD-10-CM | POA: Diagnosis not present

## 2024-01-03 DIAGNOSIS — N186 End stage renal disease: Secondary | ICD-10-CM | POA: Diagnosis not present

## 2024-01-03 DIAGNOSIS — R52 Pain, unspecified: Secondary | ICD-10-CM | POA: Diagnosis not present

## 2024-01-03 DIAGNOSIS — E1129 Type 2 diabetes mellitus with other diabetic kidney complication: Secondary | ICD-10-CM | POA: Diagnosis not present

## 2024-01-03 DIAGNOSIS — N2581 Secondary hyperparathyroidism of renal origin: Secondary | ICD-10-CM | POA: Diagnosis not present

## 2024-01-03 DIAGNOSIS — G8929 Other chronic pain: Secondary | ICD-10-CM | POA: Diagnosis not present

## 2024-01-03 DIAGNOSIS — D631 Anemia in chronic kidney disease: Secondary | ICD-10-CM | POA: Diagnosis not present

## 2024-01-04 ENCOUNTER — Other Ambulatory Visit: Payer: Self-pay | Admitting: Internal Medicine

## 2024-01-05 DIAGNOSIS — N186 End stage renal disease: Secondary | ICD-10-CM | POA: Diagnosis not present

## 2024-01-05 DIAGNOSIS — R52 Pain, unspecified: Secondary | ICD-10-CM | POA: Diagnosis not present

## 2024-01-05 DIAGNOSIS — N2581 Secondary hyperparathyroidism of renal origin: Secondary | ICD-10-CM | POA: Diagnosis not present

## 2024-01-05 DIAGNOSIS — E1129 Type 2 diabetes mellitus with other diabetic kidney complication: Secondary | ICD-10-CM | POA: Diagnosis not present

## 2024-01-05 DIAGNOSIS — Z992 Dependence on renal dialysis: Secondary | ICD-10-CM | POA: Diagnosis not present

## 2024-01-05 DIAGNOSIS — G8929 Other chronic pain: Secondary | ICD-10-CM | POA: Diagnosis not present

## 2024-01-05 DIAGNOSIS — D631 Anemia in chronic kidney disease: Secondary | ICD-10-CM | POA: Diagnosis not present

## 2024-01-06 ENCOUNTER — Other Ambulatory Visit: Payer: Self-pay | Admitting: Internal Medicine

## 2024-01-06 ENCOUNTER — Other Ambulatory Visit (HOSPITAL_COMMUNITY): Payer: Self-pay

## 2024-01-06 ENCOUNTER — Encounter: Payer: Medicare Other | Admitting: Speech Pathology

## 2024-01-06 NOTE — Telephone Encounter (Signed)
Copied from CRM [id number]. Topic: Clinical - Medication Refill >> Jan 06, 2024 10:05 AM [name] wrote: Most Recent Primary Care Visit:  Provider: Tresa Garter  Department: [facility]  Visit Type: OFFICE VISIT  Date: 10/18/2023  Medication: cyclobenzaprine (FLEXERIL) 10 MG tablet   Has the patient contacted their pharmacy? Yes (Agent: If no, request that the patient contact the pharmacy for the refill. If patient does not wish to contact the pharmacy document the reason why and proceed with request.) (Agent: If yes, when and what did the pharmacy advise?)  Is this the correct pharmacy for this prescription? Yes If no, delete pharmacy and type the correct one.  This is the patient's preferred pharmacy:    [facility] [address] [place] [address] Phone: [phone] Fax: [phone]     Has the prescription been filled recently? Yes  Is the patient out of the medication? Yes  Has the patient been seen for an appointment in the last year OR does the patient have an upcoming appointment? Yes  Can we respond through MyChart? Yes  Agent: Please be advised that Rx refills may take up to 3 business days. We ask that you follow-up with your pharmacy.

## 2024-01-07 DIAGNOSIS — G8929 Other chronic pain: Secondary | ICD-10-CM | POA: Diagnosis not present

## 2024-01-07 DIAGNOSIS — E1129 Type 2 diabetes mellitus with other diabetic kidney complication: Secondary | ICD-10-CM | POA: Diagnosis not present

## 2024-01-07 DIAGNOSIS — Z992 Dependence on renal dialysis: Secondary | ICD-10-CM | POA: Diagnosis not present

## 2024-01-07 DIAGNOSIS — N186 End stage renal disease: Secondary | ICD-10-CM | POA: Diagnosis not present

## 2024-01-07 DIAGNOSIS — D631 Anemia in chronic kidney disease: Secondary | ICD-10-CM | POA: Diagnosis not present

## 2024-01-07 DIAGNOSIS — N2581 Secondary hyperparathyroidism of renal origin: Secondary | ICD-10-CM | POA: Diagnosis not present

## 2024-01-07 DIAGNOSIS — R52 Pain, unspecified: Secondary | ICD-10-CM | POA: Diagnosis not present

## 2024-01-08 MED ORDER — CYCLOBENZAPRINE HCL 10 MG PO TABS
10.0000 mg | ORAL_TABLET | Freq: Three times a day (TID) | ORAL | 1 refills | Status: DC | PRN
  Filled 2024-01-08: qty 270, 90d supply, fill #0
  Filled 2024-04-10: qty 270, 90d supply, fill #1

## 2024-01-09 ENCOUNTER — Other Ambulatory Visit (HOSPITAL_COMMUNITY): Payer: Self-pay

## 2024-01-09 ENCOUNTER — Other Ambulatory Visit: Payer: Self-pay

## 2024-01-10 DIAGNOSIS — N186 End stage renal disease: Secondary | ICD-10-CM | POA: Diagnosis not present

## 2024-01-10 DIAGNOSIS — D631 Anemia in chronic kidney disease: Secondary | ICD-10-CM | POA: Diagnosis not present

## 2024-01-10 DIAGNOSIS — G8929 Other chronic pain: Secondary | ICD-10-CM | POA: Diagnosis not present

## 2024-01-10 DIAGNOSIS — Z992 Dependence on renal dialysis: Secondary | ICD-10-CM | POA: Diagnosis not present

## 2024-01-10 DIAGNOSIS — R52 Pain, unspecified: Secondary | ICD-10-CM | POA: Diagnosis not present

## 2024-01-10 DIAGNOSIS — E1129 Type 2 diabetes mellitus with other diabetic kidney complication: Secondary | ICD-10-CM | POA: Diagnosis not present

## 2024-01-10 DIAGNOSIS — N2581 Secondary hyperparathyroidism of renal origin: Secondary | ICD-10-CM | POA: Diagnosis not present

## 2024-01-12 ENCOUNTER — Other Ambulatory Visit (HOSPITAL_COMMUNITY): Payer: Self-pay

## 2024-01-12 DIAGNOSIS — E1129 Type 2 diabetes mellitus with other diabetic kidney complication: Secondary | ICD-10-CM | POA: Diagnosis not present

## 2024-01-12 DIAGNOSIS — D631 Anemia in chronic kidney disease: Secondary | ICD-10-CM | POA: Diagnosis not present

## 2024-01-12 DIAGNOSIS — G8929 Other chronic pain: Secondary | ICD-10-CM | POA: Diagnosis not present

## 2024-01-12 DIAGNOSIS — R52 Pain, unspecified: Secondary | ICD-10-CM | POA: Diagnosis not present

## 2024-01-12 DIAGNOSIS — Z992 Dependence on renal dialysis: Secondary | ICD-10-CM | POA: Diagnosis not present

## 2024-01-12 DIAGNOSIS — N186 End stage renal disease: Secondary | ICD-10-CM | POA: Diagnosis not present

## 2024-01-12 DIAGNOSIS — N2581 Secondary hyperparathyroidism of renal origin: Secondary | ICD-10-CM | POA: Diagnosis not present

## 2024-01-13 ENCOUNTER — Encounter: Payer: Medicare Other | Admitting: Speech Pathology

## 2024-01-13 DIAGNOSIS — Z992 Dependence on renal dialysis: Secondary | ICD-10-CM | POA: Diagnosis not present

## 2024-01-13 DIAGNOSIS — N186 End stage renal disease: Secondary | ICD-10-CM | POA: Diagnosis not present

## 2024-01-13 DIAGNOSIS — E1022 Type 1 diabetes mellitus with diabetic chronic kidney disease: Secondary | ICD-10-CM | POA: Diagnosis not present

## 2024-01-14 DIAGNOSIS — D631 Anemia in chronic kidney disease: Secondary | ICD-10-CM | POA: Diagnosis not present

## 2024-01-14 DIAGNOSIS — G8929 Other chronic pain: Secondary | ICD-10-CM | POA: Diagnosis not present

## 2024-01-14 DIAGNOSIS — N186 End stage renal disease: Secondary | ICD-10-CM | POA: Diagnosis not present

## 2024-01-14 DIAGNOSIS — R519 Headache, unspecified: Secondary | ICD-10-CM | POA: Diagnosis not present

## 2024-01-14 DIAGNOSIS — N2581 Secondary hyperparathyroidism of renal origin: Secondary | ICD-10-CM | POA: Diagnosis not present

## 2024-01-14 DIAGNOSIS — Z992 Dependence on renal dialysis: Secondary | ICD-10-CM | POA: Diagnosis not present

## 2024-01-17 ENCOUNTER — Ambulatory Visit: Payer: Medicare Other | Attending: Otolaryngology

## 2024-01-17 DIAGNOSIS — M6281 Muscle weakness (generalized): Secondary | ICD-10-CM | POA: Diagnosis not present

## 2024-01-17 DIAGNOSIS — R519 Headache, unspecified: Secondary | ICD-10-CM | POA: Diagnosis not present

## 2024-01-17 DIAGNOSIS — R490 Dysphonia: Secondary | ICD-10-CM | POA: Diagnosis not present

## 2024-01-17 DIAGNOSIS — R296 Repeated falls: Secondary | ICD-10-CM | POA: Diagnosis not present

## 2024-01-17 DIAGNOSIS — G8929 Other chronic pain: Secondary | ICD-10-CM | POA: Diagnosis not present

## 2024-01-17 DIAGNOSIS — R2689 Other abnormalities of gait and mobility: Secondary | ICD-10-CM | POA: Diagnosis not present

## 2024-01-17 DIAGNOSIS — R2681 Unsteadiness on feet: Secondary | ICD-10-CM | POA: Insufficient documentation

## 2024-01-17 DIAGNOSIS — N2581 Secondary hyperparathyroidism of renal origin: Secondary | ICD-10-CM | POA: Diagnosis not present

## 2024-01-17 DIAGNOSIS — Z992 Dependence on renal dialysis: Secondary | ICD-10-CM | POA: Diagnosis not present

## 2024-01-17 DIAGNOSIS — D631 Anemia in chronic kidney disease: Secondary | ICD-10-CM | POA: Diagnosis not present

## 2024-01-17 DIAGNOSIS — N186 End stage renal disease: Secondary | ICD-10-CM | POA: Diagnosis not present
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
## 2024-01-17 NOTE — Patient Instructions (Addendum)
 Recommendations: Breath in and speak out  Feel your lips/mouth buzz as you do your exercises  Stop your exercise/word if you lose your breath support Do NOT strain your voice  These exercises should be energized! Send your voice forward WITHOUT pushing it forward   Relax your upper body  Focus on your deep belly breathing   Resonant Voice Exercises: 1 Take a deep breath and produce a smooth, steady hum. Try to make a sound between "h" and "m". Feel for a buzz in the lips and nose.  2 Take a good belly breath and hum. Then move it gently into a vowel.  Hmmmeeee   Hmmmoooo   Hmmmohhh   Hmmmahhh   Hmmmyyyy   Hmmmayyy  3 Try moving from the hum into a single word. Keep humming into the word, it will sound like a chant.   Try Hmmmm +.. Money, Monkey, Mother, Milk, Musical, Mountain, Monster, [employer], [place], Music, Mystery, Map

## 2024-01-19 DIAGNOSIS — G8929 Other chronic pain: Secondary | ICD-10-CM | POA: Diagnosis not present

## 2024-01-19 DIAGNOSIS — R519 Headache, unspecified: Secondary | ICD-10-CM | POA: Diagnosis not present

## 2024-01-19 DIAGNOSIS — D631 Anemia in chronic kidney disease: Secondary | ICD-10-CM | POA: Diagnosis not present

## 2024-01-19 DIAGNOSIS — N186 End stage renal disease: Secondary | ICD-10-CM | POA: Diagnosis not present

## 2024-01-19 DIAGNOSIS — N2581 Secondary hyperparathyroidism of renal origin: Secondary | ICD-10-CM | POA: Diagnosis not present

## 2024-01-19 DIAGNOSIS — Z992 Dependence on renal dialysis: Secondary | ICD-10-CM | POA: Diagnosis not present

## 2024-01-20 DIAGNOSIS — S42431D Displaced fracture (avulsion) of lateral epicondyle of right humerus, subsequent encounter for fracture with routine healing: Secondary | ICD-10-CM | POA: Diagnosis not present

## 2024-01-21 DIAGNOSIS — R519 Headache, unspecified: Secondary | ICD-10-CM | POA: Diagnosis not present

## 2024-01-21 DIAGNOSIS — Z992 Dependence on renal dialysis: Secondary | ICD-10-CM | POA: Diagnosis not present

## 2024-01-21 DIAGNOSIS — N186 End stage renal disease: Secondary | ICD-10-CM | POA: Diagnosis not present

## 2024-01-21 DIAGNOSIS — G8929 Other chronic pain: Secondary | ICD-10-CM | POA: Diagnosis not present

## 2024-01-21 DIAGNOSIS — N2581 Secondary hyperparathyroidism of renal origin: Secondary | ICD-10-CM | POA: Diagnosis not present

## 2024-01-21 DIAGNOSIS — D631 Anemia in chronic kidney disease: Secondary | ICD-10-CM | POA: Diagnosis not present

## 2024-01-24 DIAGNOSIS — G8929 Other chronic pain: Secondary | ICD-10-CM | POA: Diagnosis not present

## 2024-01-24 DIAGNOSIS — N2581 Secondary hyperparathyroidism of renal origin: Secondary | ICD-10-CM | POA: Diagnosis not present

## 2024-01-24 DIAGNOSIS — N186 End stage renal disease: Secondary | ICD-10-CM | POA: Diagnosis not present

## 2024-01-24 DIAGNOSIS — Z992 Dependence on renal dialysis: Secondary | ICD-10-CM | POA: Diagnosis not present

## 2024-01-24 DIAGNOSIS — R519 Headache, unspecified: Secondary | ICD-10-CM | POA: Diagnosis not present

## 2024-01-24 DIAGNOSIS — D631 Anemia in chronic kidney disease: Secondary | ICD-10-CM | POA: Diagnosis not present

## 2024-01-25 ENCOUNTER — Ambulatory Visit: Payer: Medicare Other

## 2024-01-25 DIAGNOSIS — M6281 Muscle weakness (generalized): Secondary | ICD-10-CM | POA: Diagnosis not present

## 2024-01-25 DIAGNOSIS — R490 Dysphonia: Secondary | ICD-10-CM | POA: Diagnosis not present

## 2024-01-25 DIAGNOSIS — R2681 Unsteadiness on feet: Secondary | ICD-10-CM | POA: Diagnosis not present

## 2024-01-25 DIAGNOSIS — R2689 Other abnormalities of gait and mobility: Secondary | ICD-10-CM | POA: Diagnosis not present

## 2024-01-25 DIAGNOSIS — R296 Repeated falls: Secondary | ICD-10-CM | POA: Diagnosis not present
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
## 2024-01-26 ENCOUNTER — Other Ambulatory Visit (HOSPITAL_COMMUNITY): Payer: Self-pay

## 2024-01-26 DIAGNOSIS — N186 End stage renal disease: Secondary | ICD-10-CM | POA: Diagnosis not present

## 2024-01-26 DIAGNOSIS — D631 Anemia in chronic kidney disease: Secondary | ICD-10-CM | POA: Diagnosis not present

## 2024-01-26 DIAGNOSIS — Z992 Dependence on renal dialysis: Secondary | ICD-10-CM | POA: Diagnosis not present

## 2024-01-26 DIAGNOSIS — G8929 Other chronic pain: Secondary | ICD-10-CM | POA: Diagnosis not present

## 2024-01-26 DIAGNOSIS — N2581 Secondary hyperparathyroidism of renal origin: Secondary | ICD-10-CM | POA: Diagnosis not present

## 2024-01-26 DIAGNOSIS — R519 Headache, unspecified: Secondary | ICD-10-CM | POA: Diagnosis not present

## 2024-01-26 MED ORDER — OXYCODONE HCL 15 MG PO TABS
15.0000 mg | ORAL_TABLET | Freq: Four times a day (QID) | ORAL | 0 refills | Status: DC | PRN
  Filled 2024-01-26: qty 120, 30d supply, fill #0

## 2024-01-28 DIAGNOSIS — N186 End stage renal disease: Secondary | ICD-10-CM | POA: Diagnosis not present

## 2024-01-28 DIAGNOSIS — G8929 Other chronic pain: Secondary | ICD-10-CM | POA: Diagnosis not present

## 2024-01-28 DIAGNOSIS — N2581 Secondary hyperparathyroidism of renal origin: Secondary | ICD-10-CM | POA: Diagnosis not present

## 2024-01-28 DIAGNOSIS — D631 Anemia in chronic kidney disease: Secondary | ICD-10-CM | POA: Diagnosis not present

## 2024-01-28 DIAGNOSIS — R519 Headache, unspecified: Secondary | ICD-10-CM | POA: Diagnosis not present

## 2024-01-28 DIAGNOSIS — Z992 Dependence on renal dialysis: Secondary | ICD-10-CM | POA: Diagnosis not present

## 2024-01-30 ENCOUNTER — Ambulatory Visit: Payer: Medicare Other

## 2024-01-30 DIAGNOSIS — R490 Dysphonia: Secondary | ICD-10-CM

## 2024-01-30 DIAGNOSIS — R2681 Unsteadiness on feet: Secondary | ICD-10-CM | POA: Diagnosis not present

## 2024-01-30 DIAGNOSIS — R296 Repeated falls: Secondary | ICD-10-CM | POA: Diagnosis not present

## 2024-01-30 DIAGNOSIS — M6281 Muscle weakness (generalized): Secondary | ICD-10-CM | POA: Diagnosis not present

## 2024-01-30 DIAGNOSIS — R2689 Other abnormalities of gait and mobility: Secondary | ICD-10-CM | POA: Diagnosis not present

## 2024-01-30 NOTE — Patient Instructions (Addendum)
Relax, breathe, hum (forward project buzz)   Hmmm x5-10 Hmmmm-my-my-my Hmmm-me-me-me  Hmmm-moo-moo Hmmm-moe-moe
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
## 2024-01-31 DIAGNOSIS — Z992 Dependence on renal dialysis: Secondary | ICD-10-CM | POA: Diagnosis not present

## 2024-01-31 DIAGNOSIS — R519 Headache, unspecified: Secondary | ICD-10-CM | POA: Diagnosis not present

## 2024-01-31 DIAGNOSIS — D631 Anemia in chronic kidney disease: Secondary | ICD-10-CM | POA: Diagnosis not present

## 2024-01-31 DIAGNOSIS — N2581 Secondary hyperparathyroidism of renal origin: Secondary | ICD-10-CM | POA: Diagnosis not present

## 2024-01-31 DIAGNOSIS — N186 End stage renal disease: Secondary | ICD-10-CM | POA: Diagnosis not present

## 2024-01-31 DIAGNOSIS — G8929 Other chronic pain: Secondary | ICD-10-CM | POA: Diagnosis not present

## 2024-02-01 ENCOUNTER — Ambulatory Visit: Payer: Medicare Other

## 2024-02-02 DIAGNOSIS — N186 End stage renal disease: Secondary | ICD-10-CM | POA: Diagnosis not present

## 2024-02-02 DIAGNOSIS — N2581 Secondary hyperparathyroidism of renal origin: Secondary | ICD-10-CM | POA: Diagnosis not present

## 2024-02-02 DIAGNOSIS — G8929 Other chronic pain: Secondary | ICD-10-CM | POA: Diagnosis not present

## 2024-02-02 DIAGNOSIS — R519 Headache, unspecified: Secondary | ICD-10-CM | POA: Diagnosis not present

## 2024-02-02 DIAGNOSIS — D631 Anemia in chronic kidney disease: Secondary | ICD-10-CM | POA: Diagnosis not present

## 2024-02-02 DIAGNOSIS — Z992 Dependence on renal dialysis: Secondary | ICD-10-CM | POA: Diagnosis not present

## 2024-02-03 ENCOUNTER — Encounter (HOSPITAL_COMMUNITY)
Admission: RE | Disposition: A | Discharge status: Home / Self Care | Payer: Self-pay | Source: Home / Self Care | Attending: Internal Medicine

## 2024-02-03 ENCOUNTER — Other Ambulatory Visit: Payer: Self-pay

## 2024-02-03 ENCOUNTER — Encounter (HOSPITAL_COMMUNITY): Payer: Self-pay | Admitting: Internal Medicine

## 2024-02-03 ENCOUNTER — Ambulatory Visit (HOSPITAL_COMMUNITY)
Admission: RE | Admit: 2024-02-03 | Discharge: 2024-02-03 | Disposition: A | Discharge status: Home / Self Care | Payer: Medicare Other | Attending: Internal Medicine | Admitting: Internal Medicine

## 2024-02-03 DIAGNOSIS — Z7985 Long-term (current) use of injectable non-insulin antidiabetic drugs: Secondary | ICD-10-CM | POA: Diagnosis not present

## 2024-02-03 DIAGNOSIS — N186 End stage renal disease: Secondary | ICD-10-CM | POA: Diagnosis not present

## 2024-02-03 DIAGNOSIS — Z79899 Other long term (current) drug therapy: Secondary | ICD-10-CM | POA: Diagnosis not present

## 2024-02-03 DIAGNOSIS — I120 Hypertensive chronic kidney disease with stage 5 chronic kidney disease or end stage renal disease: Secondary | ICD-10-CM | POA: Diagnosis not present

## 2024-02-03 DIAGNOSIS — E1122 Type 2 diabetes mellitus with diabetic chronic kidney disease: Secondary | ICD-10-CM | POA: Insufficient documentation

## 2024-02-03 DIAGNOSIS — F32A Depression, unspecified: Secondary | ICD-10-CM | POA: Diagnosis not present

## 2024-02-03 DIAGNOSIS — K219 Gastro-esophageal reflux disease without esophagitis: Secondary | ICD-10-CM | POA: Insufficient documentation

## 2024-02-03 DIAGNOSIS — Y832 Surgical operation with anastomosis, bypass or graft as the cause of abnormal reaction of the patient, or of later complication, without mention of misadventure at the time of the procedure: Secondary | ICD-10-CM | POA: Insufficient documentation

## 2024-02-03 DIAGNOSIS — Z87891 Personal history of nicotine dependence: Secondary | ICD-10-CM | POA: Insufficient documentation

## 2024-02-03 DIAGNOSIS — Z7984 Long term (current) use of oral hypoglycemic drugs: Secondary | ICD-10-CM | POA: Diagnosis not present

## 2024-02-03 DIAGNOSIS — I871 Compression of vein: Secondary | ICD-10-CM | POA: Diagnosis not present

## 2024-02-03 DIAGNOSIS — Z992 Dependence on renal dialysis: Secondary | ICD-10-CM | POA: Diagnosis not present

## 2024-02-03 DIAGNOSIS — T82858A Stenosis of vascular prosthetic devices, implants and grafts, initial encounter: Secondary | ICD-10-CM | POA: Diagnosis not present

## 2024-02-03 HISTORY — PX: A/V FISTULAGRAM: CATH118298

## 2024-02-03 HISTORY — PX: PERIPHERAL VASCULAR BALLOON ANGIOPLASTY: CATH118281

## 2024-02-03 SURGERY — A/V FISTULAGRAM
Anesthesia: LOCAL

## 2024-02-03 MED ORDER — SODIUM CHLORIDE 0.9 % IV SOLN: INTRAVENOUS | Status: DC

## 2024-02-03 MED ORDER — HEPARIN (PORCINE) IN NACL 1000-0.9 UT/500ML-% IV SOLN
INTRAVENOUS | Status: DC | PRN
  Administered 2024-02-03: 500 mL

## 2024-02-03 MED ORDER — IODIXANOL 320 MG/ML IV SOLN
INTRAVENOUS | Status: DC | PRN
  Administered 2024-02-03: 8 mL via INTRAVENOUS

## 2024-02-03 MED ORDER — LIDOCAINE HCL (PF) 1 % IJ SOLN
INTRAMUSCULAR | Status: DC | PRN
  Administered 2024-02-03: 2 mL via SUBCUTANEOUS

## 2024-02-03 MED ORDER — ONDANSETRON HCL 4 MG/2ML IJ SOLN: 4.0000 mg | Freq: Four times a day (QID) | INTRAMUSCULAR | Status: DC | PRN

## 2024-02-03 MED ORDER — ACETAMINOPHEN 325 MG PO TABS: 650.0000 mg | ORAL_TABLET | ORAL | Status: DC | PRN

## 2024-02-03 SURGICAL SUPPLY — 9 items
BALLN MUSTANG 8.0X40 75 (BALLOONS) ×2
BALLOON MUSTANG 8.0X40 75 (BALLOONS) IMPLANT
CATH BEACON 5 .035 65 KMP TIP (CATHETERS) IMPLANT
COVER DOME SNAP 22 D (MISCELLANEOUS) ×2 IMPLANT
GUIDEWIRE ANGLED .035 180CM (WIRE) IMPLANT
SHEATH PINNACLE R/O II 6F 4CM (SHEATH) IMPLANT
SYR MEDALLION 10ML (SYRINGE) IMPLANT
TRAY PV CATH (CUSTOM PROCEDURE TRAY) ×2 IMPLANT
WIRE MICRO SET SILHO 5FR 7 (SHEATH) IMPLANT

## 2024-02-03 NOTE — H&P (Signed)
[facility]  Vascular Access Procedure H&P  Reason for Consultation: low AF Requesting Provider: Dr. Marisue Humble  HPI: [name] is an 73 y.o. female ESRD on HD TTS [facility], DM, GERD, OA, depression among other PMH listed below who presents for evaluation of HD access.    RUE transposed BB AVF (2021).  Has required inflow intervention a few times in the past and has a known asymptomatic central stenosis per her report.  She has never had arm edema.  She reports no prolonged bleeding.    PMH: Past Medical History:  Diagnosis Date   Anemia    Anxiety    Brain tumor (benign) ([name])    Colitis 2010   microscopic- Dr Marina Goodell   Depression    Diabetes mellitus    type II   Dyspnea    with exertion   ESRD (end stage renal disease) ([name])    TTUSAT [address]    Fibromyalgia    GERD (gastroesophageal reflux disease)    Headache    History of blood transfusion    after knee surgery   Hypertension    discontinued all diuretics and antihypertensives   IBS (irritable bowel syndrome)    LBP (low back pain)    Neuropathy    feet bilat    Osteoarthritis    Osteopenia    Pneumonia    hx of 2014    Rotator cuff tear, right    Sinusitis    currently being treated with antibiotic will complete 03/04/2015   PSH: Past Surgical History:  Procedure Laterality Date   AV FISTULA PLACEMENT Right 09/12/2020   Procedure: RIGHT ARTERIOVENOUS (AV) FISTULA CREATION;  Surgeon: Sherren Kerns, MD;  Location: [facility] OR;  Service: Vascular;  Laterality: Right;   BASCILIC VEIN TRANSPOSITION Right 10/27/2020   Procedure: RIGHT UPPER EXTREMITY SECOND STAGE BASCILIC VEIN TRANSPOSITION;  Surgeon: Sherren Kerns, MD;  Location: [facility] OR;  Service: Vascular;  Laterality: Right;   BIOPSY  03/11/2022   Procedure: BIOPSY;  Surgeon: Lemar Lofty., MD;  Location: [facility] ENDOSCOPY;  Service: Gastroenterology;;   COLONOSCOPY WITH PROPOFOL N/A 03/11/2022   Procedure: COLONOSCOPY WITH PROPOFOL;  Surgeon:  Lemar Lofty., MD;  Location: [facility] ENDOSCOPY;  Service: Gastroenterology;  Laterality: N/A;   foramen magnum ependymoma surgery  2003   Dr Jule Ser   JOINT REPLACEMENT Bilateral    NASAL SINUS SURGERY     1973    REVERSE SHOULDER ARTHROPLASTY Right 06/03/2022   Procedure: REVERSE SHOULDER ARTHROPLASTY;  Surgeon: Francena Hanly, MD;  Location: [facility] ORS;  Service: Orthopedics;  Laterality: Right;    TONSILLECTOMY     TOTAL KNEE ARTHROPLASTY     L 2008, R 2009, R 2016- Dr Despina Hick   TOTAL KNEE REVISION Right 03/05/2015   Procedure: RIGHT TOTAL KNEE ARTHROPLASTY REVISION;  Surgeon: Ollen Gross, MD;  Location: [facility] ORS;  Service: Orthopedics;  Laterality: Right;    Past Medical History:  Diagnosis Date   Anemia    Anxiety    Brain tumor (benign) ([name])    Colitis 2010   microscopic- Dr Marina Goodell   Depression    Diabetes mellitus    type II   Dyspnea    with exertion   ESRD (end stage renal disease) ([name])    TTUSAT [address]    Fibromyalgia    GERD (gastroesophageal reflux disease)    Headache    History of blood transfusion    after knee surgery   Hypertension  discontinued all diuretics and antihypertensives   IBS (irritable bowel syndrome)    LBP (low back pain)    Neuropathy    feet bilat    Osteoarthritis    Osteopenia    Pneumonia    hx of 2014    Rotator cuff tear, right    Sinusitis    currently being treated with antibiotic will complete 03/04/2015    Medications:  I have reviewed the patient's current medications.  Medications Prior to Admission  Medication Sig Dispense Refill   acetaminophen (TYLENOL) 500 MG tablet Take 1,000 mg by mouth 2 (two) times daily as needed for headache.     aspirin-acetaminophen-caffeine (EXCEDRIN MIGRAINE) 250-250-65 MG tablet Take 1 tablet by mouth daily as needed for headache or migraine. Max 3 doses in one week     azelastine (ASTELIN) 0.1 % nasal spray Place 2 sprays into both nostrils 2 (two) times daily. Use in  each nostril as directed 30 mL 12   B Complex-C-Folic Acid (RENA-VITE PO) Take 1 tablet by mouth every morning.     buPROPion (WELLBUTRIN XL) 300 MG 24 hr tablet Take 1 tablet (300 mg total) by mouth in the morning. 90 tablet 1   carboxymethylcellulose (REFRESH PLUS) 0.5 % SOLN Place 1 drop into both eyes 3 (three) times daily as needed (dry eyes/irritation).     cinacalcet (SENSIPAR) 30 MG tablet Take 30 mg by mouth Every Tuesday,Thursday,and Saturday with dialysis.     cyclobenzaprine (FLEXERIL) 10 MG tablet Take 1 tablet (10 mg total) by mouth 3 (three) times daily as needed for muscle spasms. 270 tablet 1   diphenhydramine-acetaminophen (TYLENOL PM) 25-500 MG TABS tablet Take 2 tablets by mouth at bedtime as needed (sleep).     docusate sodium (COLACE) 50 MG capsule Take 50 mg by mouth 2 (two) times daily.     Evolocumab (REPATHA SURECLICK) 140 MG/ML SOAJ Inject 140 mg into the skin every 14 (fourteen) days. 2 mL 11   FLUoxetine (PROZAC) 40 MG capsule Take 1 capsule (40 mg total) by mouth in the morning. 90 capsule 1   fluticasone (FLONASE) 50 MCG/ACT nasal spray Place 2 sprays into both nostrils daily. 16 g 6   glimepiride (AMARYL) 1 MG tablet TAKE 1 TABLET BY MOUTH DAILY  WITH BREAKFAST 100 tablet 2   ketoconazole (NIZORAL) 2 % cream Apply 1 Application topically daily. (Patient taking differently: Apply 1 Application topically daily as needed for irritation.) 60 g 3   lanthanum (FOSRENOL) 1000 MG chewable tablet Chew 1,000-2,000 mg by mouth See admin instructions. Taking 2000 mg with meals and 1000 mg  with snacks     lidocaine (LIDODERM) 5 % PLACE 2 PATCHES ONTO THE SKIN  DAILY (Patient taking differently: Place 2 patches onto the skin daily as needed (pain).) 120 patch 1   lidocaine (XYLOCAINE) 5 % ointment Apply 1 Application topically 3 (three) times daily as needed for moderate pain. 90 g 0   LORazepam (ATIVAN) 2 MG tablet Take 1 tablet (2 mg total) by mouth 2 (two) times daily as needed  for anxiety. 60 tablet 5   midodrine (PROAMATINE) 10 MG tablet Take 3 tablets (30 mg total) by mouth 3 (three) times a week. Take 30 minutes before dialysis and extra tablet during dialysis. (Patient taking differently: Take 20 mg by mouth 3 (three) times a week. Take 30 minutes before dialysis and extra tablet during dialysis.) 279 tablet 3   multivitamin-lutein (OCUVITE-LUTEIN) CAPS capsule Take 1 capsule by mouth every  morning.     naloxone (NARCAN) nasal spray 4 mg/0.1 mL Take by nasal route every 3 minutes until patient awakes or EMS arrives. 2 each 0   oxyCODONE (ROXICODONE) 15 MG immediate release tablet Take 1 tablet (15 mg total) by mouth 4 (four) times daily as needed. 120 tablet 0   pantoprazole (PROTONIX) 40 MG tablet TAKE 1 TABLET BY MOUTH TWICE  DAILY 200 tablet 2   pilocarpine (SALAGEN) 5 MG tablet TAKE 1 TABLET BY MOUTH TWICE  DAILY 200 tablet 2   pioglitazone (ACTOS) 15 MG tablet Take 1 tablet (15 mg total) by mouth daily. 100 tablet 2   polyethylene glycol (MIRALAX / GLYCOLAX) 17 g packet Take 17 g by mouth daily as needed for moderate constipation.     PREVIDENT 5000 DRY MOUTH 1.1 % GEL dental gel Place 1 application  onto teeth in the morning and at bedtime.     Semaglutide,0.25 or 0.5MG /DOS, (OZEMPIC, 0.25 OR 0.5 MG/DOSE,) 2 MG/3ML SOPN INJECT SUBCUTANEOUSLY  0.5 MG WEEKLY 9 mL 3   Accu-Chek Softclix Lancets lancets Use 1 lancet up to 4 times daily as directed 100 each 0   blood glucose meter kit and supplies Dispense based on patient and insurance preference. Used to check blood sugar daily, DX: E11.9 OneTouch 1 each 0   Blood Glucose Monitoring Suppl (ACCU-CHEK GUIDE ME) w/Device KIT Use as directed to check blood sugar four times daily. 1 kit 0   glucose blood (ACCU-CHEK GUIDE) test strip Use to check blood sugar up to four times daily. 50 each 0   Methoxy PEG-Epoetin Beta (MIRCERA IJ) Mircera     ondansetron (ZOFRAN) 4 MG tablet Take 1 tablet (4 mg total) by mouth every 8  (eight) hours as needed for nausea or vomiting. (Patient not taking: Reported on 11/30/2023) 10 tablet 0    ALLERGIES:   Allergies  Allergen Reactions   Erythromycin Diarrhea and Nausea And Vomiting   Gabapentin Other (See Comments)    Confusion and falling  Hallucinations    FAM HX: Family History  Problem Relation Age of Onset   Depression Mother    Hypertension Mother    Stroke Mother 58   Diabetes Mother    Diabetes Father    Hyperlipidemia Father    Hypertension Father    Crohn's disease Maternal Uncle    Cancer Brother        Prostate   Diabetes Brother    Heart disease Brother    Hyperlipidemia Brother    Hypertension Brother    Diabetes Brother    Heart disease Brother        Heart Disease before age 53   Heart attack Brother    Stroke Brother        X's 2   Hyperlipidemia Brother    Hypertension Brother    Diabetes Other    Colon cancer Neg Hx    Esophageal cancer Neg Hx    Stomach cancer Neg Hx    Pancreatic cancer Neg Hx    Breast cancer Neg Hx     Social History:   reports that she quit smoking about 35 years ago. Her smoking use included cigarettes. She started smoking about 55 years ago. She has a 20 pack-year smoking history. She has never used smokeless tobacco. She reports that she does not currently use alcohol. She reports that she does not use drugs.  ROS: 12 system ROS neg except per HPI above  Blood pressure 97/63, pulse 95,  resp. rate 16, SpO2 99%. PHYSICAL EXAM: Gen: well appearing, awake and alert  Eyes: glasses, EOMI ENT: dentures, class 2 airway Neck: supple CV: RRR Lungs: clear, on RA sats normal Extr:  R BB AVF collapses well, augments fairly well, some tortuosity at the inflow in particular Neuro: nonfocal    No results found. However, due to the size of the patient record, not all encounters were searched. Please check Results Review for a complete set of results.  No results found.  Assessment/PlanSara R Marcantonio is an  73 y.o. female ESRD on HD TTS [facility], DM, GERD, OA, depression among other PMH listed below who presents for evaluation of HD access.    **ESRD with HD access dysfunction:  recent AF low.  Exam suggests inflow stenosis.  Will plan angiogram with angioplasty if indicated.  She requests no sedation for todays procedure so will use local lidocaine at cannulation site alone.  She denies prior contrast reaction.  Risk benefit alternatives discussed and she elects to proceed today.   Tyler Pita 02/03/2024, 7:22 AM

## 2024-02-03 NOTE — Discharge Instructions (Signed)
General care instructions: - You should be able to eat, drink, and resume your normal medications. - Avoid any strenuous activity for the remainder of the day. Potential complications: - Your hand is more cold or numb than usual. - You are bleeding at the site and it will not stop with direct pressure. If it was a declot expect some oozing at the site. Avoid extreme pressure to the site. - You have a change in the bruit and /or thrill in your fistula or graft. - You have a fever, swelling, see redness or feel heat at or near the puncture site. Medication instructions: - Continue routine medications unless otherwise instructed. 4. Please have your sutures removed at your next scheduled dialysis treatment.

## 2024-02-03 NOTE — Op Note (Addendum)
Referred for low access flows on routine monitoring at HD >2000 to ~1200 over 2 months.  No cannulation issues or prolonged bleeding.  Collapse with elevation of the arm is good, augmentation suboptimal.    Summary:  1)      The patient had successful angioplasty (8 mm Mustang FE ~16 atm) of significant 60% stenosis in the inflow basilic vein juxta anastomotic site.  2)      Flows improved after outflow angioplasty. 3)      This right BBT remains amenable to future percutaneous intervention.  Description of procedure: The arm was prepped and draped in the usual sterile fashion. The right upper arm brachial basilic fistula was cannulated (30865) with an 21G micropuncture needle directed in a retrograde direction in venous limb of the fistula. A guidewire was inserted and exchanged for a 6 Fr sheath. Contrast [id number]) injection via the side port of the sheath was performed. The angiogram of the fistula (62952) showed a patent outflow basilic swing site; the axillary vein, centrals, cannulation zone.  The angled Glidewire was advanced and manipulated until the tip of the wire was in the proximal brachial artery.  Arteriogram revealed a patent brachial artery, arterial anastomosis but there was a 60% basilic vein stenosis about 5cm from the AA. An 8 mm Mustang angioplasty balloon was then inserted over the guidewire and positioned at the basilic vein inflow stenosis.   Venous angioplasty (84132) was carried out to 16 ATM with FULL effacement of the waist on the balloon at the basilic inflow swing site lesion. The repeat angiogram showed 10% residual stenosis with no evidence of extravasation or dissection.  Hemostasis: A 3-0 ethilon purse string suture was placed at the cannulation site on removal of the sheath.  Sedation: none by patient choice Sedation time. 0 minutes  Contrast. 6 mL  Monitoring: Because of the patient's comorbid conditions and sedation during the procedure, continuous EKG  monitoring and O2 saturation monitoring was performed throughout the procedure by the RN. There were no abnormal arrhythmias encountered.  Complications: None  Diagnoses: I87.1 Stricture of vein  N18.6 ESRD T82.858A Stricture of access  Procedure Coding:  [phone] Cannulation and angiogram of fistula, venous angioplasty (basilic vein inflow swing site)  [id number] Contrast  Recommendations:  1. Continue to cannulate the fistula with 15G needles.  2. Refer for problems with flows/swelling. 3. Remove the suture next treatment.   Discharge: The patient was discharged home in stable condition. The patient was given education regarding the care of the dialysis access AVF and specific instructions in case of any problems.

## 2024-02-04 DIAGNOSIS — N186 End stage renal disease: Secondary | ICD-10-CM | POA: Diagnosis not present

## 2024-02-04 DIAGNOSIS — G8929 Other chronic pain: Secondary | ICD-10-CM | POA: Diagnosis not present

## 2024-02-04 DIAGNOSIS — Z992 Dependence on renal dialysis: Secondary | ICD-10-CM | POA: Diagnosis not present

## 2024-02-04 DIAGNOSIS — D631 Anemia in chronic kidney disease: Secondary | ICD-10-CM | POA: Diagnosis not present

## 2024-02-04 DIAGNOSIS — R519 Headache, unspecified: Secondary | ICD-10-CM | POA: Diagnosis not present

## 2024-02-04 DIAGNOSIS — N2581 Secondary hyperparathyroidism of renal origin: Secondary | ICD-10-CM | POA: Diagnosis not present

## 2024-02-06 ENCOUNTER — Ambulatory Visit: Payer: Medicare Other | Admitting: Physical Therapy

## 2024-02-06 ENCOUNTER — Ambulatory Visit: Payer: Medicare Other

## 2024-02-06 ENCOUNTER — Encounter: Payer: Self-pay | Admitting: Physical Therapy

## 2024-02-06 DIAGNOSIS — R490 Dysphonia: Secondary | ICD-10-CM | POA: Diagnosis not present

## 2024-02-06 DIAGNOSIS — R2681 Unsteadiness on feet: Secondary | ICD-10-CM

## 2024-02-06 DIAGNOSIS — R296 Repeated falls: Secondary | ICD-10-CM

## 2024-02-06 DIAGNOSIS — M6281 Muscle weakness (generalized): Secondary | ICD-10-CM | POA: Diagnosis not present

## 2024-02-06 DIAGNOSIS — R2689 Other abnormalities of gait and mobility: Secondary | ICD-10-CM

## 2024-02-06 NOTE — Patient Instructions (Signed)
 When you talk, be clear!
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
## 2024-02-07 DIAGNOSIS — N186 End stage renal disease: Secondary | ICD-10-CM | POA: Diagnosis not present

## 2024-02-07 DIAGNOSIS — R519 Headache, unspecified: Secondary | ICD-10-CM | POA: Diagnosis not present

## 2024-02-07 DIAGNOSIS — D631 Anemia in chronic kidney disease: Secondary | ICD-10-CM | POA: Diagnosis not present

## 2024-02-07 DIAGNOSIS — Z992 Dependence on renal dialysis: Secondary | ICD-10-CM | POA: Diagnosis not present

## 2024-02-07 DIAGNOSIS — N2581 Secondary hyperparathyroidism of renal origin: Secondary | ICD-10-CM | POA: Diagnosis not present

## 2024-02-07 DIAGNOSIS — G8929 Other chronic pain: Secondary | ICD-10-CM | POA: Diagnosis not present

## 2024-02-08 ENCOUNTER — Ambulatory Visit: Payer: Medicare Other

## 2024-02-08 DIAGNOSIS — M6281 Muscle weakness (generalized): Secondary | ICD-10-CM | POA: Diagnosis not present

## 2024-02-08 DIAGNOSIS — R2689 Other abnormalities of gait and mobility: Secondary | ICD-10-CM | POA: Diagnosis not present

## 2024-02-08 DIAGNOSIS — R296 Repeated falls: Secondary | ICD-10-CM | POA: Diagnosis not present

## 2024-02-08 DIAGNOSIS — R490 Dysphonia: Secondary | ICD-10-CM

## 2024-02-08 DIAGNOSIS — R2681 Unsteadiness on feet: Secondary | ICD-10-CM | POA: Diagnosis not present
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
## 2024-02-09 DIAGNOSIS — G8929 Other chronic pain: Secondary | ICD-10-CM | POA: Diagnosis not present

## 2024-02-09 DIAGNOSIS — R519 Headache, unspecified: Secondary | ICD-10-CM | POA: Diagnosis not present

## 2024-02-09 DIAGNOSIS — N2581 Secondary hyperparathyroidism of renal origin: Secondary | ICD-10-CM | POA: Diagnosis not present

## 2024-02-09 DIAGNOSIS — N186 End stage renal disease: Secondary | ICD-10-CM | POA: Diagnosis not present

## 2024-02-09 DIAGNOSIS — M25551 Pain in right hip: Secondary | ICD-10-CM | POA: Diagnosis not present

## 2024-02-09 DIAGNOSIS — Z992 Dependence on renal dialysis: Secondary | ICD-10-CM | POA: Diagnosis not present

## 2024-02-09 DIAGNOSIS — D631 Anemia in chronic kidney disease: Secondary | ICD-10-CM | POA: Diagnosis not present

## 2024-02-10 DIAGNOSIS — E1022 Type 1 diabetes mellitus with diabetic chronic kidney disease: Secondary | ICD-10-CM | POA: Diagnosis not present

## 2024-02-10 DIAGNOSIS — N186 End stage renal disease: Secondary | ICD-10-CM | POA: Diagnosis not present

## 2024-02-10 DIAGNOSIS — Z992 Dependence on renal dialysis: Secondary | ICD-10-CM | POA: Diagnosis not present

## 2024-02-11 DIAGNOSIS — G8929 Other chronic pain: Secondary | ICD-10-CM | POA: Diagnosis not present

## 2024-02-11 DIAGNOSIS — D631 Anemia in chronic kidney disease: Secondary | ICD-10-CM | POA: Diagnosis not present

## 2024-02-11 DIAGNOSIS — R519 Headache, unspecified: Secondary | ICD-10-CM | POA: Diagnosis not present

## 2024-02-11 DIAGNOSIS — E8770 Fluid overload, unspecified: Secondary | ICD-10-CM | POA: Diagnosis not present

## 2024-02-11 DIAGNOSIS — Z992 Dependence on renal dialysis: Secondary | ICD-10-CM | POA: Diagnosis not present

## 2024-02-11 DIAGNOSIS — N2581 Secondary hyperparathyroidism of renal origin: Secondary | ICD-10-CM | POA: Diagnosis not present

## 2024-02-11 DIAGNOSIS — N186 End stage renal disease: Secondary | ICD-10-CM | POA: Diagnosis not present

## 2024-02-13 ENCOUNTER — Ambulatory Visit: Payer: Medicare Other | Attending: Internal Medicine | Admitting: Physical Therapy

## 2024-02-13 ENCOUNTER — Telehealth: Payer: Self-pay | Admitting: Physical Therapy

## 2024-02-13 ENCOUNTER — Other Ambulatory Visit (HOSPITAL_COMMUNITY): Payer: Self-pay

## 2024-02-13 DIAGNOSIS — R490 Dysphonia: Secondary | ICD-10-CM | POA: Insufficient documentation

## 2024-02-13 DIAGNOSIS — M21372 Foot drop, left foot: Secondary | ICD-10-CM

## 2024-02-13 DIAGNOSIS — M6281 Muscle weakness (generalized): Secondary | ICD-10-CM | POA: Insufficient documentation

## 2024-02-13 DIAGNOSIS — R296 Repeated falls: Secondary | ICD-10-CM | POA: Insufficient documentation

## 2024-02-13 DIAGNOSIS — R2689 Other abnormalities of gait and mobility: Secondary | ICD-10-CM | POA: Diagnosis not present

## 2024-02-13 DIAGNOSIS — R2681 Unsteadiness on feet: Secondary | ICD-10-CM | POA: Diagnosis not present

## 2024-02-13 DIAGNOSIS — M7918 Myalgia, other site: Secondary | ICD-10-CM | POA: Diagnosis not present

## 2024-02-13 MED ORDER — PREDNISONE 10 MG PO TABS
ORAL_TABLET | ORAL | 0 refills | Status: AC
  Filled 2024-02-13: qty 21, 6d supply, fill #0

## 2024-02-13 NOTE — Telephone Encounter (Signed)
 Dr. Posey Rea,   Dan Europe is being seen in physical therapy for peripheral neuropathy and repeated falls. The patient would benefit from an AFO order for L foot drop.    If you agree, please place an order in [facility] workque in [facility] [place] or fax the order to [phone].   Thank you, Josephine Igo, PT, DPT [facility] [address] [place], [place]  [address] Phone:  [phone] Fax:  [phone]

## 2024-02-13 NOTE — Telephone Encounter (Signed)
 It is okay with me.  I do not know how to get in [facility] workque in [place].  thank you
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
## 2024-02-14 DIAGNOSIS — Z992 Dependence on renal dialysis: Secondary | ICD-10-CM | POA: Diagnosis not present

## 2024-02-14 DIAGNOSIS — E8770 Fluid overload, unspecified: Secondary | ICD-10-CM | POA: Diagnosis not present

## 2024-02-14 DIAGNOSIS — R519 Headache, unspecified: Secondary | ICD-10-CM | POA: Diagnosis not present

## 2024-02-14 DIAGNOSIS — N2581 Secondary hyperparathyroidism of renal origin: Secondary | ICD-10-CM | POA: Diagnosis not present

## 2024-02-14 DIAGNOSIS — D631 Anemia in chronic kidney disease: Secondary | ICD-10-CM | POA: Diagnosis not present

## 2024-02-14 DIAGNOSIS — G8929 Other chronic pain: Secondary | ICD-10-CM | POA: Diagnosis not present

## 2024-02-14 DIAGNOSIS — N186 End stage renal disease: Secondary | ICD-10-CM | POA: Diagnosis not present

## 2024-02-15 ENCOUNTER — Other Ambulatory Visit (HOSPITAL_COMMUNITY): Payer: Self-pay

## 2024-02-15 ENCOUNTER — Encounter: Payer: Self-pay | Admitting: Physical Therapy

## 2024-02-15 ENCOUNTER — Ambulatory Visit: Payer: Medicare Other | Admitting: Physical Therapy

## 2024-02-15 VITALS — BP 144/67 | HR 90

## 2024-02-15 DIAGNOSIS — M6281 Muscle weakness (generalized): Secondary | ICD-10-CM | POA: Diagnosis not present

## 2024-02-15 DIAGNOSIS — R2689 Other abnormalities of gait and mobility: Secondary | ICD-10-CM

## 2024-02-15 DIAGNOSIS — R490 Dysphonia: Secondary | ICD-10-CM | POA: Diagnosis not present

## 2024-02-15 DIAGNOSIS — R2681 Unsteadiness on feet: Secondary | ICD-10-CM

## 2024-02-15 DIAGNOSIS — R296 Repeated falls: Secondary | ICD-10-CM | POA: Diagnosis not present
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
## 2024-02-16 ENCOUNTER — Telehealth: Payer: Self-pay

## 2024-02-16 DIAGNOSIS — N186 End stage renal disease: Secondary | ICD-10-CM | POA: Diagnosis not present

## 2024-02-16 DIAGNOSIS — Z992 Dependence on renal dialysis: Secondary | ICD-10-CM | POA: Diagnosis not present

## 2024-02-16 DIAGNOSIS — D631 Anemia in chronic kidney disease: Secondary | ICD-10-CM | POA: Diagnosis not present

## 2024-02-16 DIAGNOSIS — E8770 Fluid overload, unspecified: Secondary | ICD-10-CM | POA: Diagnosis not present

## 2024-02-16 DIAGNOSIS — N2581 Secondary hyperparathyroidism of renal origin: Secondary | ICD-10-CM | POA: Diagnosis not present

## 2024-02-16 DIAGNOSIS — G8929 Other chronic pain: Secondary | ICD-10-CM | POA: Diagnosis not present

## 2024-02-16 DIAGNOSIS — R519 Headache, unspecified: Secondary | ICD-10-CM | POA: Diagnosis not present

## 2024-02-16 NOTE — Telephone Encounter (Signed)
 Copied from CRM [id number]. Topic: General - Other >> Feb 15, 2024  4:42 PM Sim Boast F wrote: Reason for CRM: Patient seen at OUTPATIENT PHYSICAL THERAPY NEURO TREATMENT today and wants to let Dr. Posey Rea know that the splint helped out and made a big difference with her walking and balance. Says she needs an order faxed over to OUTPATIENT PHYSICAL THERAPY NEURO TREATMENT for the splint

## 2024-02-17 NOTE — Telephone Encounter (Signed)
 Please sign order and print it so I can fax it over. Thanks.

## 2024-02-17 NOTE — Telephone Encounter (Signed)
 [name],  Please fax.  Thank you

## 2024-02-17 NOTE — Addendum Note (Signed)
 Addended by: Delsa Grana R on: 02/17/2024 03:24 PM   Modules accepted: Orders

## 2024-02-18 DIAGNOSIS — E8770 Fluid overload, unspecified: Secondary | ICD-10-CM | POA: Diagnosis not present

## 2024-02-18 DIAGNOSIS — N2581 Secondary hyperparathyroidism of renal origin: Secondary | ICD-10-CM | POA: Diagnosis not present

## 2024-02-18 DIAGNOSIS — Z992 Dependence on renal dialysis: Secondary | ICD-10-CM | POA: Diagnosis not present

## 2024-02-18 DIAGNOSIS — R519 Headache, unspecified: Secondary | ICD-10-CM | POA: Diagnosis not present

## 2024-02-18 DIAGNOSIS — N186 End stage renal disease: Secondary | ICD-10-CM | POA: Diagnosis not present

## 2024-02-18 DIAGNOSIS — G8929 Other chronic pain: Secondary | ICD-10-CM | POA: Diagnosis not present

## 2024-02-18 DIAGNOSIS — D631 Anemia in chronic kidney disease: Secondary | ICD-10-CM | POA: Diagnosis not present

## 2024-02-19 ENCOUNTER — Telehealth: Payer: Self-pay | Admitting: Internal Medicine

## 2024-02-19 NOTE — Telephone Encounter (Signed)
 Please schedule daycare with any provider.  Thank you

## 2024-02-19 NOTE — Telephone Encounter (Signed)
-----   Message from Carmelia Bake sent at 02/16/2024  8:50 AM EST ----- Regarding: Second Opinion HI Dr. Posey Rea,  I wanted to follow up with some updates in regards to Charm Barges. Patient was reporting what initially sounded like a reoccurrence of BPPV in today's session; however, upon further evaluation, patient presenting with significant central findings including rotatory nystagmus without upbeat, downbeat nystagmus, and saccadic smooth pursuits. Patient did have a benign brain tumor resection in early 2000s but onset of symptoms is more recent; we would recommend updated imaging as you see fit to rule out new onset of central pathology. Patient would also likely benefit from neurology referral.  Additionally, we would still recommend an AFO order to address immediate balance deficits so if you could addend you most recent note to add medical justification as well as fax an order to [phone], that would be greatly appreciated.   Thank you, Maryruth Eve, PT, DPT

## 2024-02-20 DIAGNOSIS — M21372 Foot drop, left foot: Secondary | ICD-10-CM | POA: Insufficient documentation

## 2024-02-20 NOTE — Assessment & Plan Note (Signed)
.   The patient would benefit from an AFO order for L foot drop.

## 2024-02-20 NOTE — Telephone Encounter (Signed)
Pt has been scheduled with PCP.  

## 2024-02-21 ENCOUNTER — Other Ambulatory Visit: Payer: Self-pay | Admitting: Internal Medicine

## 2024-02-21 DIAGNOSIS — G8929 Other chronic pain: Secondary | ICD-10-CM | POA: Diagnosis not present

## 2024-02-21 DIAGNOSIS — N2581 Secondary hyperparathyroidism of renal origin: Secondary | ICD-10-CM | POA: Diagnosis not present

## 2024-02-21 DIAGNOSIS — E8770 Fluid overload, unspecified: Secondary | ICD-10-CM | POA: Diagnosis not present

## 2024-02-21 DIAGNOSIS — D631 Anemia in chronic kidney disease: Secondary | ICD-10-CM | POA: Diagnosis not present

## 2024-02-21 DIAGNOSIS — N186 End stage renal disease: Secondary | ICD-10-CM | POA: Diagnosis not present

## 2024-02-21 DIAGNOSIS — Z992 Dependence on renal dialysis: Secondary | ICD-10-CM | POA: Diagnosis not present

## 2024-02-21 DIAGNOSIS — R519 Headache, unspecified: Secondary | ICD-10-CM | POA: Diagnosis not present

## 2024-02-22 ENCOUNTER — Other Ambulatory Visit (HOSPITAL_COMMUNITY): Payer: Self-pay

## 2024-02-22 ENCOUNTER — Ambulatory Visit: Payer: Medicare Other

## 2024-02-22 ENCOUNTER — Ambulatory Visit: Payer: Medicare Other | Admitting: Physical Therapy

## 2024-02-22 DIAGNOSIS — M6281 Muscle weakness (generalized): Secondary | ICD-10-CM | POA: Diagnosis not present

## 2024-02-22 DIAGNOSIS — R2689 Other abnormalities of gait and mobility: Secondary | ICD-10-CM

## 2024-02-22 DIAGNOSIS — R490 Dysphonia: Secondary | ICD-10-CM | POA: Diagnosis not present

## 2024-02-22 DIAGNOSIS — R296 Repeated falls: Secondary | ICD-10-CM

## 2024-02-22 DIAGNOSIS — R2681 Unsteadiness on feet: Secondary | ICD-10-CM

## 2024-02-22 MED ORDER — OXYCODONE HCL 15 MG PO TABS
15.0000 mg | ORAL_TABLET | Freq: Four times a day (QID) | ORAL | 0 refills | Status: AC | PRN
  Filled 2024-02-23 – 2024-02-24 (×2): qty 120, 30d supply, fill #0
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
## 2024-02-23 ENCOUNTER — Other Ambulatory Visit (HOSPITAL_COMMUNITY): Payer: Self-pay

## 2024-02-23 DIAGNOSIS — E8770 Fluid overload, unspecified: Secondary | ICD-10-CM | POA: Diagnosis not present

## 2024-02-23 DIAGNOSIS — N2581 Secondary hyperparathyroidism of renal origin: Secondary | ICD-10-CM | POA: Diagnosis not present

## 2024-02-23 DIAGNOSIS — N186 End stage renal disease: Secondary | ICD-10-CM | POA: Diagnosis not present

## 2024-02-23 DIAGNOSIS — Z992 Dependence on renal dialysis: Secondary | ICD-10-CM | POA: Diagnosis not present

## 2024-02-23 DIAGNOSIS — R519 Headache, unspecified: Secondary | ICD-10-CM | POA: Diagnosis not present

## 2024-02-23 DIAGNOSIS — D631 Anemia in chronic kidney disease: Secondary | ICD-10-CM | POA: Diagnosis not present

## 2024-02-23 DIAGNOSIS — G8929 Other chronic pain: Secondary | ICD-10-CM | POA: Diagnosis not present

## 2024-02-24 ENCOUNTER — Ambulatory Visit: Payer: Medicare Other | Admitting: Physical Therapy

## 2024-02-24 ENCOUNTER — Other Ambulatory Visit (HOSPITAL_COMMUNITY): Payer: Self-pay

## 2024-02-24 VITALS — BP 108/59 | HR 93

## 2024-02-24 DIAGNOSIS — G5621 Lesion of ulnar nerve, right upper limb: Secondary | ICD-10-CM | POA: Diagnosis not present

## 2024-02-24 DIAGNOSIS — M6281 Muscle weakness (generalized): Secondary | ICD-10-CM

## 2024-02-24 DIAGNOSIS — R296 Repeated falls: Secondary | ICD-10-CM

## 2024-02-24 DIAGNOSIS — S42431A Displaced fracture (avulsion) of lateral epicondyle of right humerus, initial encounter for closed fracture: Secondary | ICD-10-CM | POA: Diagnosis not present

## 2024-02-24 DIAGNOSIS — R2681 Unsteadiness on feet: Secondary | ICD-10-CM

## 2024-02-24 DIAGNOSIS — R2689 Other abnormalities of gait and mobility: Secondary | ICD-10-CM | POA: Diagnosis not present

## 2024-02-24 DIAGNOSIS — R490 Dysphonia: Secondary | ICD-10-CM | POA: Diagnosis not present
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
## 2024-02-25 DIAGNOSIS — Z992 Dependence on renal dialysis: Secondary | ICD-10-CM | POA: Diagnosis not present

## 2024-02-25 DIAGNOSIS — N2581 Secondary hyperparathyroidism of renal origin: Secondary | ICD-10-CM | POA: Diagnosis not present

## 2024-02-25 DIAGNOSIS — R519 Headache, unspecified: Secondary | ICD-10-CM | POA: Diagnosis not present

## 2024-02-25 DIAGNOSIS — E8770 Fluid overload, unspecified: Secondary | ICD-10-CM | POA: Diagnosis not present

## 2024-02-25 DIAGNOSIS — D631 Anemia in chronic kidney disease: Secondary | ICD-10-CM | POA: Diagnosis not present

## 2024-02-25 DIAGNOSIS — N186 End stage renal disease: Secondary | ICD-10-CM | POA: Diagnosis not present

## 2024-02-25 DIAGNOSIS — G8929 Other chronic pain: Secondary | ICD-10-CM | POA: Diagnosis not present

## 2024-02-26 DIAGNOSIS — G5620 Lesion of ulnar nerve, unspecified upper limb: Secondary | ICD-10-CM | POA: Insufficient documentation

## 2024-02-27 ENCOUNTER — Ambulatory Visit (INDEPENDENT_AMBULATORY_CARE_PROVIDER_SITE_OTHER): Payer: Medicare Other | Admitting: Otolaryngology

## 2024-02-28 DIAGNOSIS — D631 Anemia in chronic kidney disease: Secondary | ICD-10-CM | POA: Diagnosis not present

## 2024-02-28 DIAGNOSIS — N2581 Secondary hyperparathyroidism of renal origin: Secondary | ICD-10-CM | POA: Diagnosis not present

## 2024-02-28 DIAGNOSIS — E8770 Fluid overload, unspecified: Secondary | ICD-10-CM | POA: Diagnosis not present

## 2024-02-28 DIAGNOSIS — Z992 Dependence on renal dialysis: Secondary | ICD-10-CM | POA: Diagnosis not present

## 2024-02-28 DIAGNOSIS — N186 End stage renal disease: Secondary | ICD-10-CM | POA: Diagnosis not present

## 2024-02-28 DIAGNOSIS — G8929 Other chronic pain: Secondary | ICD-10-CM | POA: Diagnosis not present

## 2024-02-28 DIAGNOSIS — R519 Headache, unspecified: Secondary | ICD-10-CM | POA: Diagnosis not present

## 2024-02-29 ENCOUNTER — Ambulatory Visit: Payer: Medicare Other | Admitting: Physical Therapy

## 2024-02-29 ENCOUNTER — Ambulatory Visit: Payer: Medicare Other

## 2024-03-01 DIAGNOSIS — E8770 Fluid overload, unspecified: Secondary | ICD-10-CM | POA: Diagnosis not present

## 2024-03-01 DIAGNOSIS — Z992 Dependence on renal dialysis: Secondary | ICD-10-CM | POA: Diagnosis not present

## 2024-03-01 DIAGNOSIS — G8929 Other chronic pain: Secondary | ICD-10-CM | POA: Diagnosis not present

## 2024-03-01 DIAGNOSIS — N2581 Secondary hyperparathyroidism of renal origin: Secondary | ICD-10-CM | POA: Diagnosis not present

## 2024-03-01 DIAGNOSIS — R519 Headache, unspecified: Secondary | ICD-10-CM | POA: Diagnosis not present

## 2024-03-01 DIAGNOSIS — N186 End stage renal disease: Secondary | ICD-10-CM | POA: Diagnosis not present

## 2024-03-01 DIAGNOSIS — D631 Anemia in chronic kidney disease: Secondary | ICD-10-CM | POA: Diagnosis not present

## 2024-03-02 ENCOUNTER — Ambulatory Visit: Payer: Medicare Other | Admitting: Physical Therapy

## 2024-03-02 VITALS — BP 109/67 | HR 96

## 2024-03-02 DIAGNOSIS — R490 Dysphonia: Secondary | ICD-10-CM | POA: Diagnosis not present

## 2024-03-02 DIAGNOSIS — R296 Repeated falls: Secondary | ICD-10-CM | POA: Diagnosis not present

## 2024-03-02 DIAGNOSIS — M6281 Muscle weakness (generalized): Secondary | ICD-10-CM

## 2024-03-02 DIAGNOSIS — R2689 Other abnormalities of gait and mobility: Secondary | ICD-10-CM

## 2024-03-02 DIAGNOSIS — R2681 Unsteadiness on feet: Secondary | ICD-10-CM
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
## 2024-03-03 DIAGNOSIS — R519 Headache, unspecified: Secondary | ICD-10-CM | POA: Diagnosis not present

## 2024-03-03 DIAGNOSIS — E8770 Fluid overload, unspecified: Secondary | ICD-10-CM | POA: Diagnosis not present

## 2024-03-03 DIAGNOSIS — Z992 Dependence on renal dialysis: Secondary | ICD-10-CM | POA: Diagnosis not present

## 2024-03-03 DIAGNOSIS — G8929 Other chronic pain: Secondary | ICD-10-CM | POA: Diagnosis not present

## 2024-03-03 DIAGNOSIS — N2581 Secondary hyperparathyroidism of renal origin: Secondary | ICD-10-CM | POA: Diagnosis not present

## 2024-03-03 DIAGNOSIS — N186 End stage renal disease: Secondary | ICD-10-CM | POA: Diagnosis not present

## 2024-03-03 DIAGNOSIS — D631 Anemia in chronic kidney disease: Secondary | ICD-10-CM | POA: Diagnosis not present

## 2024-03-05 ENCOUNTER — Ambulatory Visit: Payer: Medicare Other | Admitting: Physical Therapy

## 2024-03-05 ENCOUNTER — Encounter: Payer: Self-pay | Admitting: Internal Medicine

## 2024-03-05 ENCOUNTER — Ambulatory Visit (INDEPENDENT_AMBULATORY_CARE_PROVIDER_SITE_OTHER): Admitting: Internal Medicine

## 2024-03-05 ENCOUNTER — Ambulatory Visit: Payer: Medicare Other

## 2024-03-05 VITALS — BP 116/70 | HR 102

## 2024-03-05 VITALS — BP 122/82 | HR 90 | Ht 62.5 in | Wt 195.0 lb

## 2024-03-05 DIAGNOSIS — M6281 Muscle weakness (generalized): Secondary | ICD-10-CM | POA: Diagnosis not present

## 2024-03-05 DIAGNOSIS — Z7984 Long term (current) use of oral hypoglycemic drugs: Secondary | ICD-10-CM

## 2024-03-05 DIAGNOSIS — N186 End stage renal disease: Secondary | ICD-10-CM

## 2024-03-05 DIAGNOSIS — R296 Repeated falls: Secondary | ICD-10-CM

## 2024-03-05 DIAGNOSIS — R2681 Unsteadiness on feet: Secondary | ICD-10-CM

## 2024-03-05 DIAGNOSIS — R490 Dysphonia: Secondary | ICD-10-CM | POA: Diagnosis not present

## 2024-03-05 DIAGNOSIS — R2689 Other abnormalities of gait and mobility: Secondary | ICD-10-CM | POA: Diagnosis not present

## 2024-03-05 DIAGNOSIS — M25522 Pain in left elbow: Secondary | ICD-10-CM | POA: Diagnosis not present

## 2024-03-05 DIAGNOSIS — Z992 Dependence on renal dialysis: Secondary | ICD-10-CM | POA: Diagnosis not present

## 2024-03-05 DIAGNOSIS — D332 Benign neoplasm of brain, unspecified: Secondary | ICD-10-CM | POA: Diagnosis not present

## 2024-03-05 DIAGNOSIS — E1129 Type 2 diabetes mellitus with other diabetic kidney complication: Secondary | ICD-10-CM | POA: Diagnosis not present
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
## 2024-03-05 NOTE — Assessment & Plan Note (Signed)
 On HD

## 2024-03-05 NOTE — Progress Notes (Addendum)
 Subjective:  Patient ID: [name], female    DOB: February 14, 1951  Age: 73 y.o. MRN: [id number]  CC: Results (Follow up regarding recommendations from Speech Therapy provider . Patient advised to reach out to neurologist due to multiple falls, fractures within the last year. Patient notes falling at least 12-15 times within the last year)   HPI [name] presents for a follow up regarding recommendations from PT provider . Patient advised to reach out to neurologist due to multiple falls, fractures within the last year. Patient notes falling at least 12-15 times within the last year. No clear falls pattern. Poor balance. Syncopal at times.  Outpatient Medications Prior to Visit  Medication Sig Dispense Refill   Accu-Chek Softclix Lancets lancets Use 1 lancet up to 4 times daily as directed 100 each 0   acetaminophen  (TYLENOL ) 500 MG tablet Take 1,000 mg by mouth 2 (two) times daily as needed for headache.     aspirin -acetaminophen -caffeine (EXCEDRIN MIGRAINE) 250-250-65 MG tablet Take 1 tablet by mouth daily as needed for headache or migraine. Max 3 doses in one week     azelastine  (ASTELIN ) 0.1 % nasal spray Place 2 sprays into both nostrils 2 (two) times daily. Use in each nostril as directed 30 mL 12   B Complex-C-Folic Acid  (RENA-VITE PO) Take 1 tablet by mouth every morning.     blood glucose meter kit and supplies Dispense based on patient and insurance preference. Used to check blood sugar daily, DX: E11.9 OneTouch 1 each 0   Blood Glucose Monitoring Suppl (ACCU-CHEK GUIDE ME) w/Device KIT Use as directed to check blood sugar four times daily. 1 kit 0   buPROPion  (WELLBUTRIN  XL) 300 MG 24 hr tablet Take 1 tablet (300 mg total) by mouth in the morning. 90 tablet 1   carboxymethylcellulose (REFRESH PLUS) 0.5 % SOLN Place 1 drop into both eyes 3 (three) times daily as needed (dry eyes/irritation).     cinacalcet (SENSIPAR) 30 MG tablet Take 30 mg by mouth Every Tuesday,Thursday,and  Saturday with dialysis.     cyclobenzaprine  (FLEXERIL ) 10 MG tablet Take 1 tablet (10 mg total) by mouth 3 (three) times daily as needed for muscle spasms. 270 tablet 1   diphenhydramine -acetaminophen  (TYLENOL  PM) 25-500 MG TABS tablet Take 2 tablets by mouth at bedtime as needed (sleep).     docusate sodium  (COLACE) 50 MG capsule Take 50 mg by mouth 2 (two) times daily.     FLUoxetine  (PROZAC ) 40 MG capsule Take 1 capsule (40 mg total) by mouth in the morning. 90 capsule 1   fluticasone  (FLONASE ) 50 MCG/ACT nasal spray Place 2 sprays into both nostrils daily. 16 g 6   glimepiride  (AMARYL ) 1 MG tablet TAKE 1 TABLET BY MOUTH DAILY  WITH BREAKFAST 100 tablet 2   glucose blood (ACCU-CHEK GUIDE) test strip Use to check blood sugar up to four times daily. 50 each 0   ketoconazole  (NIZORAL ) 2 % cream Apply 1 Application topically daily. (Patient taking differently: Apply 1 Application topically daily as needed for irritation.) 60 g 3   lanthanum (FOSRENOL) 1000 MG chewable tablet Chew 1,000-2,000 mg by mouth See admin instructions. Taking 2000 mg with meals and 1000 mg  with snacks     lidocaine  (LIDODERM ) 5 % PLACE 2 PATCHES ONTO THE SKIN  DAILY (Patient taking differently: Place 2 patches onto the skin daily as needed (pain).) 120 patch 1   lidocaine  (XYLOCAINE ) 5 % ointment Apply 1 Application topically 3 (three) times  daily as needed for moderate pain. 90 g 0   LORazepam  (ATIVAN ) 2 MG tablet Take 1 tablet (2 mg total) by mouth 2 (two) times daily as needed for anxiety. 60 tablet 5   Methoxy PEG-Epoetin  Beta (MIRCERA IJ) Mircera     midodrine  (PROAMATINE ) 10 MG tablet Take 3 tablets (30 mg total) by mouth 3 (three) times a week. Take 30 minutes before dialysis and extra tablet during dialysis. (Patient taking differently: Take 20 mg by mouth 3 (three) times a week. Take 30 minutes before dialysis and extra tablet during dialysis.) 279 tablet 3   multivitamin-lutein (OCUVITE-LUTEIN) CAPS capsule Take 1  capsule by mouth every morning.     naloxone  (NARCAN ) nasal spray 4 mg/0.1 mL Take by nasal route every 3 minutes until patient awakes or EMS arrives. 2 each 0   ondansetron  (ZOFRAN ) 4 MG tablet Take 1 tablet (4 mg total) by mouth every 8 (eight) hours as needed for nausea or vomiting. 10 tablet 0   oxyCODONE  (ROXICODONE ) 15 MG immediate release tablet Take 1 tablet (15 mg total) by mouth 4 (four) times daily as needed. 120 tablet 0   pantoprazole  (PROTONIX ) 40 MG tablet TAKE 1 TABLET BY MOUTH TWICE  DAILY 200 tablet 2   pilocarpine  (SALAGEN ) 5 MG tablet TAKE 1 TABLET BY MOUTH TWICE  DAILY 200 tablet 2   pioglitazone  (ACTOS ) 15 MG tablet TAKE 1 TABLET BY MOUTH DAILY 100 tablet 2   polyethylene glycol (MIRALAX  / GLYCOLAX ) 17 g packet Take 17 g by mouth daily as needed for moderate constipation.     PREVIDENT 5000 DRY MOUTH 1.1 % GEL dental gel Place 1 application  onto teeth in the morning and at bedtime.     Semaglutide ,0.25 or 0.5MG /DOS, (OZEMPIC , 0.25 OR 0.5 MG/DOSE,) 2 MG/3ML SOPN INJECT SUBCUTANEOUSLY  0.5 MG WEEKLY 9 mL 3   Evolocumab  (REPATHA  SURECLICK) 140 MG/ML SOAJ Inject 140 mg into the skin every 14 (fourteen) days. 2 mL 11   No facility-administered medications prior to visit.    ROS: Review of Systems  Constitutional:  Positive for fatigue. Negative for activity change, appetite change, chills and unexpected weight change.  HENT:  Negative for congestion, mouth sores and sinus pressure.   Eyes:  Negative for visual disturbance.  Respiratory:  Negative for cough and chest tightness.   Gastrointestinal:  Negative for abdominal pain and nausea.  Genitourinary:  Negative for difficulty urinating, enuresis, frequency, urgency and vaginal pain.  Musculoskeletal:  Positive for back pain and gait problem.  Skin:  Negative for pallor and rash.  Neurological:  Positive for dizziness, syncope, weakness and light-headedness. Negative for tremors, numbness and headaches.   Psychiatric/Behavioral:  Negative for confusion, sleep disturbance and suicidal ideas. The patient is not nervous/anxious.     Objective:  BP 122/82   Pulse 90   Ht 5' 2.5" (1.588 m)   Wt 195 lb (88.5 kg)   SpO2 99%   BMI 35.10 kg/m   BP Readings from Last 3 Encounters:  05/04/24 (!) 147/70  03/28/24 135/82  03/23/24 (!) 104/54    Wt Readings from Last 3 Encounters:  03/28/24 193 lb (87.5 kg)  03/05/24 195 lb (88.5 kg)  11/30/23 198 lb (89.8 kg)    Physical Exam Constitutional:      General: She is not in acute distress.    Appearance: She is well-developed. She is obese.  HENT:     Head: Normocephalic.     Right Ear: External ear normal.  Left Ear: External ear normal.     Nose: Nose normal.  Eyes:     General:        Right eye: No discharge.        Left eye: No discharge.     Conjunctiva/sclera: Conjunctivae normal.     Pupils: Pupils are equal, round, and reactive to light.  Neck:     Thyroid : No thyromegaly.     Vascular: No JVD.     Trachea: No tracheal deviation.  Cardiovascular:     Rate and Rhythm: Normal rate and regular rhythm.     Heart sounds: Normal heart sounds.  Pulmonary:     Effort: No respiratory distress.     Breath sounds: No stridor. No wheezing.  Abdominal:     General: Bowel sounds are normal. There is no distension.     Palpations: Abdomen is soft. There is no mass.     Tenderness: There is no abdominal tenderness. There is no guarding or rebound.  Musculoskeletal:        General: Tenderness present.     Cervical back: Normal range of motion and neck supple. No rigidity.     Right lower leg: No edema.     Left lower leg: No edema.  Lymphadenopathy:     Cervical: No cervical adenopathy.  Skin:    Findings: Rash present. No erythema.  Neurological:     Mental Status: She is oriented to person, place, and time.     Cranial Nerves: No cranial nerve deficit.     Motor: Weakness present. No abnormal muscle tone.      Coordination: Coordination abnormal.     Gait: Gait abnormal.     Deep Tendon Reflexes: Reflexes normal.  Psychiatric:        Behavior: Behavior normal.        Thought Content: Thought content normal.        Judgment: Judgment normal.   Using a cane Ataxic Wobbly gait LS w/pain No pronator drift   Lab Results  Component Value Date   WBC 12.3 (H) 03/09/2023   HGB 11.2 (L) 03/09/2023   HCT 34.3 (L) 03/09/2023   PLT 271.0 03/09/2023   GLUCOSE 148 (H) 08/22/2023   CHOL 129 08/22/2023   TRIG 207.0 (H) 08/22/2023   HDL 50.60 08/22/2023   LDLCALC 37 08/22/2023   ALT 13 08/22/2023   AST 16 08/22/2023   NA 136 08/22/2023   K 5.3 (H) 08/22/2023   CL 90 (L) 08/22/2023   CREATININE 5.83 (HH) 08/22/2023   BUN 49 (H) 08/22/2023   CO2 33 (H) 08/22/2023   TSH 3.41 08/22/2023   INR 0.9 03/09/2023   HGBA1C 6.5 08/22/2023    PERIPHERAL VASCULAR CATHETERIZATION Result Date: 02/03/2024 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  Resume usual care.     Assessment & Plan:   Problem List Items Addressed This Visit     Type 2 diabetes mellitus with other diabetic kidney complication ([name])   A1c per Nephrology On Ozempic  0.5 mg/wk      ESRD on hemodialysis ([name])   On HD      Falls frequently - Primary   H/o multiple falls, fractures within the last year. Patient notes falling at least 12-15 times within the last year. No clear falls pattern. Poor balance. Syncopal at times. Neurology referral -  Narrative  *RADIOLOGY REPORT*  Clinical Data: History of ependymoma resection in 2003.  Follow-up. Dizziness with  falling.  Symptoms worsening.  MRI HEAD WITHOUT AND WITH CONTRAST  Technique:  Multiplanar, multiecho pulse sequences of the brain and surrounding structures were obtained according to standard protocol without and with intravenous contrast  Contrast: 10mL MULTIHANCE  GADOBENATE DIMEGLUMINE  529 MG/ML IV  SOLN  Comparison: 09/24/2006 and multiple previous  Findings: Again seen is suboccipital craniectomy and resection of the posterior elements of C1-C2 related to previous tumor resection at the posterior craniocervical junction region in the distant past.  Today's study does not show any restricted diffusion to indicate acute or subacute infarction.  There are chronic appearing small vessel changes affecting the pons.  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  There has been only minimal progression since 2007. No cortical or large vessel territory infarction.  There is no evidence of residual or recurrent mass lesion.  No evidence of hydrocephalus or extra-axial collection.  No skull or skull base lesion.  IMPRESSION MRI brain 2014: No acute finding.  No evidence of residual or recurrent ependymoma. Chronic appearing small vessel changes of the pons, thalami, basal ganglia and hemispheric white matter, minimally progressive since 2007.   Original Report Authenticated By: Bettylou Brunner, M.D.        Relevant Orders   Ambulatory referral to Neurology   MR Brain W Wo Contrast (Completed)   Left elbow pain   S/p fall, fx      Brain tumor (benign) ([name])   H/o epyndymoma - remote H/o multiple falls, fractures within the last year. Patient notes falling at least 12-15 times within the last year. No clear falls pattern. Poor balance. Syncopal at times. Neurology referral -  Narrative  *RADIOLOGY REPORT*  Clinical Data: History of ependymoma resection in 2003.  Follow-up. Dizziness with falling.  Symptoms worsening.  MRI HEAD WITHOUT AND WITH CONTRAST  Technique:  Multiplanar, multiecho pulse sequences of the brain and surrounding structures were obtained according to standard protocol without and with intravenous contrast  Contrast: 10mL MULTIHANCE   GADOBENATE DIMEGLUMINE  529 MG/ML IV SOLN  Comparison: 09/24/2006 and multiple previous  Findings: Again seen is suboccipital craniectomy and resection of the posterior elements of C1-C2 related to previous tumor resection at the posterior craniocervical junction region in the distant past.  Today's study does not show any restricted diffusion to indicate acute or subacute infarction.  There are chronic appearing small vessel changes affecting the pons.  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  There has been only minimal progression since 2007. No cortical or large vessel territory infarction.  There is no evidence of residual or recurrent mass lesion.  No evidence of hydrocephalus or extra-axial collection.  No skull or skull base lesion.  IMPRESSION MRI brain 2014: No acute finding.  No evidence of residual or recurrent ependymoma. Chronic appearing small vessel changes of the pons, thalami, basal ganglia and hemispheric white matter, minimally progressive since 2007.   Original Report Authenticated By: Bettylou Brunner, M.D.        Relevant Orders   Ambulatory referral to Neurology   MR Brain W Wo Contrast (Completed)      No orders of the defined types were placed in this encounter.     Follow-up: Return in about 3 months (around 06/05/2024) for a follow-up visit.  Anitra Barn, MD

## 2024-03-05 NOTE — Patient Instructions (Addendum)
 Expiratory Muscle Strength Training (EMST) 1. Place the noseclips on your nose.  2. Take a deep breath in, do not breathe out.  3. Place the mouthpiece in your mouth, behind your teeth, securing your lips tightly around it, holding/pressing the sides of your cheeks if needed.  4. Breathe out hard and fast using your stomach muscles to push air through the device. This breathing effort should only last a couple seconds for the air to move through (sounds like a bike pump).  5. REPEAT this exercise 5 times. Then you need a minute break between sets.   6. You need to do 5 sets of 5 repetitions (25 total). Keep a written talley to help you remember.   7. If you feel lightheaded at anytime during the exercise,stop and discontinue.   03/05/24: Device set at 60

## 2024-03-05 NOTE — Assessment & Plan Note (Signed)
 H/o epyndymoma - remote H/o multiple falls, fractures within the last year. Patient notes falling at least 12-15 times within the last year. No clear falls pattern. Poor balance. Syncopal at times. Neurology referral -  Narrative  *RADIOLOGY REPORT*  Clinical Data: History of ependymoma resection in 2003.  Follow-up. Dizziness with falling.  Symptoms worsening.  MRI HEAD WITHOUT AND WITH CONTRAST  Technique:  Multiplanar, multiecho pulse sequences of the brain and surrounding structures were obtained according to standard protocol without and with intravenous contrast  Contrast: 10mL MULTIHANCE GADOBENATE DIMEGLUMINE 529 MG/ML IV SOLN  Comparison: 09/24/2006 and multiple previous  Findings: Again seen is suboccipital craniectomy and resection of the posterior elements of C1-C2 related to previous tumor resection at the posterior craniocervical junction region in the distant past.  Today's study does not show any restricted diffusion to indicate acute or subacute infarction.  There are chronic appearing small vessel changes affecting the pons.  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  There has been only minimal progression since 2007. No cortical or large vessel territory infarction.  There is no evidence of residual or recurrent mass lesion.  No evidence of hydrocephalus or extra-axial collection.  No skull or skull base lesion.  IMPRESSION MRI brain 2014: No acute finding.  No evidence of residual or recurrent ependymoma. Chronic appearing small vessel changes of the pons, thalami, basal ganglia and hemispheric white matter, minimally progressive since 2007.   Original Report Authenticated By: Paulina Fusi, M.D.

## 2024-03-05 NOTE — Assessment & Plan Note (Addendum)
 H/o multiple falls, fractures within the last year. Patient notes falling at least 12-15 times within the last year. No clear falls pattern. Poor balance. Syncopal at times. Neurology referral -  Narrative  *RADIOLOGY REPORT*  Clinical Data: History of ependymoma resection in 2003.  Follow-up. Dizziness with falling.  Symptoms worsening.  MRI HEAD WITHOUT AND WITH CONTRAST  Technique:  Multiplanar, multiecho pulse sequences of the brain and surrounding structures were obtained according to standard protocol without and with intravenous contrast  Contrast: 10mL MULTIHANCE GADOBENATE DIMEGLUMINE 529 MG/ML IV SOLN  Comparison: 09/24/2006 and multiple previous  Findings: Again seen is suboccipital craniectomy and resection of the posterior elements of C1-C2 related to previous tumor resection at the posterior craniocervical junction region in the distant past.  Today's study does not show any restricted diffusion to indicate acute or subacute infarction.  There are chronic appearing small vessel changes affecting the pons.  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  There has been only minimal progression since 2007. No cortical or large vessel territory infarction.  There is no evidence of residual or recurrent mass lesion.  No evidence of hydrocephalus or extra-axial collection.  No skull or skull base lesion.  IMPRESSION MRI brain 2014: No acute finding.  No evidence of residual or recurrent ependymoma. Chronic appearing small vessel changes of the pons, thalami, basal ganglia and hemispheric white matter, minimally progressive since 2007.   Original Report Authenticated By: Paulina Fusi, M.D.
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
## 2024-03-05 NOTE — Assessment & Plan Note (Addendum)
 A1c per Nephrology On Ozempic  0.5 mg/wk

## 2024-03-05 NOTE — Assessment & Plan Note (Signed)
 S/p fall, fx

## 2024-03-06 ENCOUNTER — Encounter: Payer: Self-pay | Admitting: Neurology

## 2024-03-06 DIAGNOSIS — D631 Anemia in chronic kidney disease: Secondary | ICD-10-CM | POA: Diagnosis not present

## 2024-03-06 DIAGNOSIS — Z992 Dependence on renal dialysis: Secondary | ICD-10-CM | POA: Diagnosis not present

## 2024-03-06 DIAGNOSIS — E8770 Fluid overload, unspecified: Secondary | ICD-10-CM | POA: Diagnosis not present

## 2024-03-06 DIAGNOSIS — N186 End stage renal disease: Secondary | ICD-10-CM | POA: Diagnosis not present

## 2024-03-06 DIAGNOSIS — G8929 Other chronic pain: Secondary | ICD-10-CM | POA: Diagnosis not present

## 2024-03-06 DIAGNOSIS — N2581 Secondary hyperparathyroidism of renal origin: Secondary | ICD-10-CM | POA: Diagnosis not present

## 2024-03-06 DIAGNOSIS — R519 Headache, unspecified: Secondary | ICD-10-CM | POA: Diagnosis not present

## 2024-03-07 ENCOUNTER — Ambulatory Visit: Payer: Medicare Other | Admitting: Physical Therapy

## 2024-03-07 ENCOUNTER — Ambulatory Visit: Payer: Medicare Other

## 2024-03-07 DIAGNOSIS — R519 Headache, unspecified: Secondary | ICD-10-CM | POA: Diagnosis not present

## 2024-03-07 DIAGNOSIS — N2581 Secondary hyperparathyroidism of renal origin: Secondary | ICD-10-CM | POA: Diagnosis not present

## 2024-03-07 DIAGNOSIS — Z992 Dependence on renal dialysis: Secondary | ICD-10-CM | POA: Diagnosis not present

## 2024-03-07 DIAGNOSIS — D631 Anemia in chronic kidney disease: Secondary | ICD-10-CM | POA: Diagnosis not present

## 2024-03-07 DIAGNOSIS — N186 End stage renal disease: Secondary | ICD-10-CM | POA: Diagnosis not present

## 2024-03-07 DIAGNOSIS — G8929 Other chronic pain: Secondary | ICD-10-CM | POA: Diagnosis not present

## 2024-03-07 DIAGNOSIS — E8770 Fluid overload, unspecified: Secondary | ICD-10-CM | POA: Diagnosis not present

## 2024-03-08 DIAGNOSIS — N186 End stage renal disease: Secondary | ICD-10-CM | POA: Diagnosis not present

## 2024-03-08 DIAGNOSIS — Z992 Dependence on renal dialysis: Secondary | ICD-10-CM | POA: Diagnosis not present

## 2024-03-08 DIAGNOSIS — N2581 Secondary hyperparathyroidism of renal origin: Secondary | ICD-10-CM | POA: Diagnosis not present

## 2024-03-08 DIAGNOSIS — E8770 Fluid overload, unspecified: Secondary | ICD-10-CM | POA: Diagnosis not present

## 2024-03-08 DIAGNOSIS — G8929 Other chronic pain: Secondary | ICD-10-CM | POA: Diagnosis not present

## 2024-03-08 DIAGNOSIS — D631 Anemia in chronic kidney disease: Secondary | ICD-10-CM | POA: Diagnosis not present

## 2024-03-08 DIAGNOSIS — R519 Headache, unspecified: Secondary | ICD-10-CM | POA: Diagnosis not present

## 2024-03-10 DIAGNOSIS — D631 Anemia in chronic kidney disease: Secondary | ICD-10-CM | POA: Diagnosis not present

## 2024-03-10 DIAGNOSIS — E8770 Fluid overload, unspecified: Secondary | ICD-10-CM | POA: Diagnosis not present

## 2024-03-10 DIAGNOSIS — G8929 Other chronic pain: Secondary | ICD-10-CM | POA: Diagnosis not present

## 2024-03-10 DIAGNOSIS — Z992 Dependence on renal dialysis: Secondary | ICD-10-CM | POA: Diagnosis not present

## 2024-03-10 DIAGNOSIS — R519 Headache, unspecified: Secondary | ICD-10-CM | POA: Diagnosis not present

## 2024-03-10 DIAGNOSIS — N2581 Secondary hyperparathyroidism of renal origin: Secondary | ICD-10-CM | POA: Diagnosis not present

## 2024-03-10 DIAGNOSIS — N186 End stage renal disease: Secondary | ICD-10-CM | POA: Diagnosis not present

## 2024-03-12 ENCOUNTER — Ambulatory Visit: Payer: Medicare Other | Admitting: Physical Therapy

## 2024-03-12 ENCOUNTER — Encounter: Payer: Self-pay | Admitting: Physical Therapy

## 2024-03-12 ENCOUNTER — Ambulatory Visit: Payer: Medicare Other

## 2024-03-12 VITALS — BP 126/71 | HR 89

## 2024-03-12 DIAGNOSIS — R2689 Other abnormalities of gait and mobility: Secondary | ICD-10-CM | POA: Diagnosis not present

## 2024-03-12 DIAGNOSIS — R296 Repeated falls: Secondary | ICD-10-CM

## 2024-03-12 DIAGNOSIS — Z992 Dependence on renal dialysis: Secondary | ICD-10-CM | POA: Diagnosis not present

## 2024-03-12 DIAGNOSIS — E1022 Type 1 diabetes mellitus with diabetic chronic kidney disease: Secondary | ICD-10-CM | POA: Diagnosis not present

## 2024-03-12 DIAGNOSIS — R2681 Unsteadiness on feet: Secondary | ICD-10-CM | POA: Diagnosis not present

## 2024-03-12 DIAGNOSIS — R490 Dysphonia: Secondary | ICD-10-CM

## 2024-03-12 DIAGNOSIS — N186 End stage renal disease: Secondary | ICD-10-CM | POA: Diagnosis not present

## 2024-03-12 DIAGNOSIS — M6281 Muscle weakness (generalized): Secondary | ICD-10-CM
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
## 2024-03-13 DIAGNOSIS — R519 Headache, unspecified: Secondary | ICD-10-CM | POA: Diagnosis not present

## 2024-03-13 DIAGNOSIS — G8929 Other chronic pain: Secondary | ICD-10-CM | POA: Diagnosis not present

## 2024-03-13 DIAGNOSIS — N186 End stage renal disease: Secondary | ICD-10-CM | POA: Diagnosis not present

## 2024-03-13 DIAGNOSIS — D631 Anemia in chronic kidney disease: Secondary | ICD-10-CM | POA: Diagnosis not present

## 2024-03-13 DIAGNOSIS — Z992 Dependence on renal dialysis: Secondary | ICD-10-CM | POA: Diagnosis not present

## 2024-03-13 DIAGNOSIS — N2581 Secondary hyperparathyroidism of renal origin: Secondary | ICD-10-CM | POA: Diagnosis not present

## 2024-03-14 ENCOUNTER — Ambulatory Visit: Payer: Medicare Other

## 2024-03-14 ENCOUNTER — Ambulatory Visit: Payer: Medicare Other | Attending: Internal Medicine | Admitting: Physical Therapy

## 2024-03-14 ENCOUNTER — Other Ambulatory Visit (HOSPITAL_COMMUNITY): Payer: Self-pay

## 2024-03-14 ENCOUNTER — Encounter: Payer: Self-pay | Admitting: Physical Therapy

## 2024-03-14 VITALS — BP 110/66 | HR 77

## 2024-03-14 DIAGNOSIS — M6281 Muscle weakness (generalized): Secondary | ICD-10-CM | POA: Diagnosis not present

## 2024-03-14 DIAGNOSIS — R2689 Other abnormalities of gait and mobility: Secondary | ICD-10-CM | POA: Insufficient documentation

## 2024-03-14 DIAGNOSIS — M21372 Foot drop, left foot: Secondary | ICD-10-CM | POA: Insufficient documentation

## 2024-03-14 DIAGNOSIS — R490 Dysphonia: Secondary | ICD-10-CM | POA: Insufficient documentation

## 2024-03-14 DIAGNOSIS — R296 Repeated falls: Secondary | ICD-10-CM | POA: Insufficient documentation

## 2024-03-14 DIAGNOSIS — R2681 Unsteadiness on feet: Secondary | ICD-10-CM | POA: Insufficient documentation

## 2024-03-14 MED ORDER — LORAZEPAM 2 MG PO TABS
2.0000 mg | ORAL_TABLET | Freq: Two times a day (BID) | ORAL | 5 refills | Status: AC | PRN
  Filled 2024-03-14 – 2024-04-06 (×2): qty 60, 30d supply, fill #0
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
## 2024-03-15 DIAGNOSIS — N186 End stage renal disease: Secondary | ICD-10-CM | POA: Diagnosis not present

## 2024-03-15 DIAGNOSIS — Z992 Dependence on renal dialysis: Secondary | ICD-10-CM | POA: Diagnosis not present

## 2024-03-15 DIAGNOSIS — D631 Anemia in chronic kidney disease: Secondary | ICD-10-CM | POA: Diagnosis not present

## 2024-03-15 DIAGNOSIS — R519 Headache, unspecified: Secondary | ICD-10-CM | POA: Diagnosis not present

## 2024-03-15 DIAGNOSIS — N2581 Secondary hyperparathyroidism of renal origin: Secondary | ICD-10-CM | POA: Diagnosis not present

## 2024-03-15 DIAGNOSIS — G8929 Other chronic pain: Secondary | ICD-10-CM | POA: Diagnosis not present

## 2024-03-17 DIAGNOSIS — N2581 Secondary hyperparathyroidism of renal origin: Secondary | ICD-10-CM | POA: Diagnosis not present

## 2024-03-17 DIAGNOSIS — D631 Anemia in chronic kidney disease: Secondary | ICD-10-CM | POA: Diagnosis not present

## 2024-03-17 DIAGNOSIS — Z992 Dependence on renal dialysis: Secondary | ICD-10-CM | POA: Diagnosis not present

## 2024-03-17 DIAGNOSIS — N186 End stage renal disease: Secondary | ICD-10-CM | POA: Diagnosis not present

## 2024-03-17 DIAGNOSIS — G8929 Other chronic pain: Secondary | ICD-10-CM | POA: Diagnosis not present

## 2024-03-17 DIAGNOSIS — R519 Headache, unspecified: Secondary | ICD-10-CM | POA: Diagnosis not present

## 2024-03-19 ENCOUNTER — Ambulatory Visit: Payer: Medicare Other

## 2024-03-19 ENCOUNTER — Ambulatory Visit: Payer: Medicare Other | Admitting: Physical Therapy

## 2024-03-19 DIAGNOSIS — R490 Dysphonia: Secondary | ICD-10-CM

## 2024-03-19 DIAGNOSIS — R296 Repeated falls: Secondary | ICD-10-CM | POA: Diagnosis not present

## 2024-03-19 DIAGNOSIS — M21372 Foot drop, left foot: Secondary | ICD-10-CM | POA: Diagnosis not present

## 2024-03-19 DIAGNOSIS — M6281 Muscle weakness (generalized): Secondary | ICD-10-CM | POA: Diagnosis not present

## 2024-03-19 DIAGNOSIS — R2681 Unsteadiness on feet: Secondary | ICD-10-CM | POA: Diagnosis not present

## 2024-03-19 DIAGNOSIS — R2689 Other abnormalities of gait and mobility: Secondary | ICD-10-CM | POA: Diagnosis not present
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
## 2024-03-19 NOTE — Progress Notes (Signed)
 Initial neurology clinic note  Reason for Evaluation: Consultation requested by Plotnikov, Aleksei V, MD for an opinion regarding frequent falls, weakness. My final recommendations will be communicated back to the requesting physician by way of shared medical record or letter to requesting physician via [place]  mail.  HPI: This is [name], a 73 y.o. right-handed female with a medical history of DM2, ependymoma s/p resection, HTN, fibromyalgia, ESRD on dialysis, OA, anxiety who presents to neurology clinic with the chief complaint of frequent falls, weakness. The patient is alone today.  Patient is having a lot falls. This started in 2016, but has gotten worse over the last few years. She has fallen twice in 2025. She has also had multiple injuries. Per [facility] note (media from 02/27/24) patient has hx of fracture of right lateral humeral epicondyle and right ulnar neuropathy at the elbow - given a cubital tunnel splint.   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 This started with numbness in her toes. It is now in both feet. She typically does not have pain, but occasionally has tingling and burning at night. She will take oxycodone for this. She has tried gabapentin but she had hallucinations on it. She mentions she tore all the ligaments in her left foot. She has not had any surgical repair on this. She feels this is contributing to imbalance as well.  She endorses dry eyes and mouth. She denies difficulty sweating, gastroparetic early satiety, postprandial abdominal bloating, constipation, bowel or bladder dyscontrol.  She does not report any constitutional symptoms like fever, night sweats, anorexia or unintentional weight loss.  She has lightheadedness that also contributes. She notices this when standing. It improves with sitting. She has midodrine that she takes on the day she takes dialysis. She had syncopal episodes, usually on days after  dialysis.  Of note, patient takes vitamins due to dialysis, one of which is [place]-[employer]ite that has 500% of daily B6.  Patient has another MRI brain scheduled for 04/23/24 to re-evaluate ependymoma.  Patient just finished PT at the beginning of 03/2024. She has been fitted for an orthotic on the left.  EtOH use: None  Restrictive diet? No Family history of neuropathy/myopathy/neurologic disease? Mother and her siblings had neuropathy (cause unknown, but did have back issues); brother with strokes   MEDICATIONS:  Outpatient Encounter Medications as of 03/28/2024  Medication Sig Note   Accu-Chek Softclix Lancets lancets Use 1 lancet up to 4 times daily as directed    acetaminophen (TYLENOL) 500 MG tablet Take 1,000 mg by mouth 2 (two) times daily as needed for headache.    aspirin-acetaminophen-caffeine (EXCEDRIN MIGRAINE) 250-250-65 MG tablet Take 1 tablet by mouth daily as needed for headache or migraine. Max 3 doses in one week    azelastine (ASTELIN) 0.1 % nasal spray Place 2 sprays into both nostrils 2 (two) times daily. Use in each nostril as directed    B Complex-C-Folic Acid (RENA-VITE PO) Take 1 tablet by mouth every morning.    blood glucose meter kit and supplies Dispense based on patient and insurance preference. Used to check blood sugar daily, DX: E11.9 OneTouch    Blood Glucose Monitoring Suppl (ACCU-CHEK GUIDE ME) w/Device KIT Use as directed to check blood sugar four times daily.    buPROPion (WELLBUTRIN XL) 300 MG 24 hr tablet Take 1 tablet (300 mg total) by mouth in the morning.    carboxymethylcellulose (REFRESH PLUS) 0.5 % SOLN Place 1  drop into both eyes 3 (three) times daily as needed (dry eyes/irritation).    cholecalciferol (VITAMIN D3) 25 MCG (1000 UNIT) tablet Take by mouth daily.    cinacalcet (SENSIPAR) 30 MG tablet Take 30 mg by mouth Every Tuesday,Thursday,and Saturday with dialysis.    cyclobenzaprine (FLEXERIL) 10 MG tablet Take 1 tablet (10 mg total) by mouth 3  (three) times daily as needed for muscle spasms.    diphenhydramine-acetaminophen (TYLENOL PM) 25-500 MG TABS tablet Take 2 tablets by mouth at bedtime as needed (sleep).    docusate sodium (COLACE) 50 MG capsule Take 50 mg by mouth 2 (two) times daily.    Evolocumab (REPATHA SURECLICK) 140 MG/ML SOAJ Inject 140 mg into the skin every 14 (fourteen) days.    FLUoxetine (PROZAC) 40 MG capsule Take 1 capsule (40 mg total) by mouth in the morning.    fluticasone (FLONASE) 50 MCG/ACT nasal spray Place 2 sprays into both nostrils daily.    glimepiride (AMARYL) 1 MG tablet TAKE 1 TABLET BY MOUTH DAILY  WITH BREAKFAST    glucose blood (ACCU-CHEK GUIDE) test strip Use to check blood sugar up to four times daily.    lanthanum (FOSRENOL) 1000 MG chewable tablet Chew 1,000-2,000 mg by mouth See admin instructions. Taking 2000 mg with meals and 1000 mg  with snacks    LORazepam (ATIVAN) 2 MG tablet Take 1 tablet (2 mg total) by mouth 2 (two) times daily as needed for anxiety.    midodrine (PROAMATINE) 10 MG tablet Take 3 tablets (30 mg total) by mouth 3 (three) times a week. Take 30 minutes before dialysis and extra tablet during dialysis. (Patient taking differently: Take 20 mg by mouth 3 (three) times a week. Take 30 minutes before dialysis and extra tablet during dialysis.)    multivitamin-lutein (OCUVITE-LUTEIN) CAPS capsule Take 1 capsule by mouth every morning.    naloxone (NARCAN) nasal spray 4 mg/0.1 mL Take by nasal route every 3 minutes until patient awakes or EMS arrives. 03/10/2022: Hasn't needed   oxyCODONE (ROXICODONE) 15 MG immediate release tablet Take 1 tablet (15 mg total) by mouth 4 (four) times daily as needed.    pantoprazole (PROTONIX) 40 MG tablet TAKE 1 TABLET BY MOUTH TWICE  DAILY    pilocarpine (SALAGEN) 5 MG tablet TAKE 1 TABLET BY MOUTH TWICE  DAILY    pioglitazone (ACTOS) 15 MG tablet TAKE 1 TABLET BY MOUTH DAILY    polyethylene glycol (MIRALAX / GLYCOLAX) 17 g packet Take 17 g by  mouth daily as needed for moderate constipation.    PREVIDENT 5000 DRY MOUTH 1.1 % GEL dental gel Place 1 application  onto teeth in the morning and at bedtime.    Semaglutide,0.25 or 0.5MG /DOS, (OZEMPIC, 0.25 OR 0.5 MG/DOSE,) 2 MG/3ML SOPN INJECT SUBCUTANEOUSLY  0.5 MG WEEKLY    ketoconazole (NIZORAL) 2 % cream Apply 1 Application topically daily. (Patient taking differently: Apply 1 Application topically daily as needed for irritation.)    lidocaine (LIDODERM) 5 % PLACE 2 PATCHES ONTO THE SKIN  DAILY (Patient taking differently: Place 2 patches onto the skin daily as needed (pain).)    lidocaine (XYLOCAINE) 5 % ointment Apply 1 Application topically 3 (three) times daily as needed for moderate pain.    LORazepam (ATIVAN) 2 MG tablet Take 1 tablet (2 mg total) by mouth 2 (two) times daily as needed for daily anxiety (Patient not taking: Reported on 03/28/2024)    Methoxy PEG-Epoetin Beta (MIRCERA IJ) Mircera    ondansetron (ZOFRAN)  4 MG tablet Take 1 tablet (4 mg total) by mouth every 8 (eight) hours as needed for nausea or vomiting. (Patient not taking: Reported on 03/28/2024)    No facility-administered encounter medications on file as of 03/28/2024.    PAST MEDICAL HISTORY: Past Medical History:  Diagnosis Date   Anemia    Anxiety    Brain tumor (benign) ([name])    Colitis 2010   microscopic- Dr Elvin Hammer   Depression    Diabetes mellitus    type II   Dyspnea    with exertion   ESRD (end stage renal disease) ([name])    TTUSAT [address]    Fibromyalgia    GERD (gastroesophageal reflux disease)    Headache    History of blood transfusion    after knee surgery   Hypertension    discontinued all diuretics and antihypertensives   IBS (irritable bowel syndrome)    LBP (low back pain)    Neuropathy    feet bilat    Osteoarthritis    Osteopenia    Pneumonia    hx of 2014    Rotator cuff tear, right    Sinusitis    currently being treated with antibiotic will complete 03/04/2015     PAST SURGICAL HISTORY: Past Surgical History:  Procedure Laterality Date   A/V FISTULAGRAM N/A 02/03/2024   Procedure: A/V Fistulagram;  Surgeon: Baron Border, MD;  Location: [facility] INVASIVE CV LAB;  Service: Cardiovascular;  Laterality: N/A;   AV FISTULA PLACEMENT Right 09/12/2020   Procedure: RIGHT ARTERIOVENOUS (AV) FISTULA CREATION;  Surgeon: Richrd Char, MD;  Location[facility] OR;  Service: Vascular;  Laterality: Right;   BASCILIC VEIN TRANSPOSITION Right 10/27/2020   Procedure: RIGHT UPPER EXTREMITY SECOND STAGE BASCILIC VEIN TRANSPOSITION;  Surgeon: Richrd Char, MD;  Location: [facility] OR;  Service: Vascular;  Laterality: Right;   BIOPSY  03/11/2022   Procedure: BIOPSY;  Surgeon: Normie Becton., MD;  Location: [facility] ENDOSCOPY;  Service: Gastroenterology;;   COLONOSCOPY WITH PROPOFOL N/A 03/11/2022   Procedure: COLONOSCOPY WITH PROPOFOL;  Surgeon: Normie Becton., MD;  Location: [facility] ENDOSCOPY;  Service: Gastroenterology;  Laterality: N/A;   foramen magnum ependymoma surgery  2003   Dr Lawson Prey   JOINT REPLACEMENT Bilateral    NASAL SINUS SURGERY     1973    PERIPHERAL VASCULAR BALLOON ANGIOPLASTY  02/03/2024   Procedure: PERIPHERAL VASCULAR BALLOON ANGIOPLASTY;  Surgeon: Baron Border, MD;  Location: [facility] INVASIVE CV LAB;  Service: Cardiovascular;;  Inflow Basilic Vein   REVERSE SHOULDER ARTHROPLASTY Right 06/03/2022   Procedure: REVERSE SHOULDER ARTHROPLASTY;  Surgeon: Ellard Gunning, MD;  Location: [facility] ORS;  Service: Orthopedics;  Laterality: Right;    TONSILLECTOMY     TOTAL KNEE ARTHROPLASTY     L 2008, R 2009, R 2016- Dr Rossie Coon   TOTAL KNEE REVISION Right 03/05/2015   Procedure: RIGHT TOTAL KNEE ARTHROPLASTY REVISION;  Surgeon: Liliane Rei, MD;  Location: [facility] ORS;  Service: Orthopedics;  Laterality: Right;    ALLERGIES: Allergies  Allergen Reactions   Erythromycin Diarrhea and Nausea And Vomiting   Gabapentin Other (See Comments)    Confusion and  falling  Hallucinations    FAMILY HISTORY: Family History  Problem Relation Age of Onset   Depression Mother    Hypertension Mother    Stroke Mother 31   Diabetes Mother    Diabetes Father    Hyperlipidemia Father    Hypertension Father    Crohn's disease Maternal Uncle  Cancer Brother        Prostate   Diabetes Brother    Heart disease Brother    Hyperlipidemia Brother    Hypertension Brother    Diabetes Brother    Heart disease Brother        Heart Disease before age 49   Heart attack Brother    Stroke Brother        X's 2   Hyperlipidemia Brother    Hypertension Brother    Diabetes Other    Colon cancer Neg Hx    Esophageal cancer Neg Hx    Stomach cancer Neg Hx    Pancreatic cancer Neg Hx    Breast cancer Neg Hx     SOCIAL HISTORY: Social History   Tobacco Use   Smoking status: Former    Current packs/day: 0.00    Average packs/day: 1 pack/day for 20.0 years (20.0 ttl pk-yrs)    Types: Cigarettes    Start date: 12/13/1968    Quit date: 12/13/1988    Years since quitting: 35.3   Smokeless tobacco: Never  Vaping Use   Vaping status: Never Used  Substance Use Topics   Alcohol use: Not Currently    Alcohol/week: 0.0 standard drinks of alcohol    Comment: rarely   Drug use: No   Social History   Social History Narrative   Are you right handed or left handed? Right   Are you currently employed ?    What is your current occupation? Retired    Do you live at home alone?yes   Who lives with you?    What type of home do you live in: 1 story or 2 story? Apartment with elevator    Caffiene none     OBJECTIVE: PHYSICAL EXAM: BP 135/82   Pulse 89   Ht 5' 2.5" (1.588 m)   Wt 193 lb (87.5 kg)   SpO2 93%   BMI 34.74 kg/m   General: General appearance: Awake and alert. No distress. Cooperative with exam.  Skin: No obvious rash or jaundice. HEENT: Atraumatic. Anicteric. Lungs: Non-labored breathing on room air  Extremities: Swelling and deformity of  left ankle. Hammertoes bilaterally. Psych: Affect appropriate.  Neurological: Mental Status: Alert. Speech fluent. No pseudobulbar affect Cranial Nerves: CNII: No RAPD. Visual fields grossly intact. CNIII, IV, VI: PERRL. No nystagmus. EOMI. CN V: Facial sensation intact bilaterally to fine touch. CN VII: Facial muscles symmetric and strong. No ptosis at rest. CN VIII: Hearing grossly intact bilaterally. CN IX: No hypophonia. CN X: Palate elevates symmetrically. CN XI: Full strength shoulder shrug bilaterally. CN XII: Tongue protrusion full and midline. No atrophy or fasciculations. No significant dysarthria Motor: Tone is normal. Strength is 5/5 in bilateral upper and lower extremities. Reflexes:  Right Left   Bicep 2+ 2+   Tricep 2+ 2+   BrRad 2+ 2+   Knee 0 0 Bilateral knee replacement  Ankle 2+ 2+    Pathological Reflexes: Babinski: mute response bilaterally Hoffman: absent bilaterally Troemner: absent bilaterally Sensation: Pinprick: Diminished in bilateral lower extremities to knees, otherwise intact Vibration: Absent in bilateral great toes, diminished in ankles, otherwise intact Proprioception: Intact in bilateral great toes Coordination: Intact finger-to- nose-finger bilaterally. Romberg positive. Gait: Able to rise from chair with arms crossed unassisted. Wide based, waddling gait. Left foot internally rotated when walking.  Lab and Test Review: Internal labs: 08/22/23: CMP significant for K 5.3, glucose 148, BUN 49, Cr 5.83 HbA1c: 6.5 TSH wnl Lipid panel: tChol 129,  LDL 37, TG 207  Imaging/Procedures: CT head wo contrast (03/10/22): FINDINGS: Brain: There is mild cerebral atrophy with widening of the extra-axial spaces and ventricular dilatation. There are areas of decreased attenuation within the white matter tracts of the supratentorial brain, consistent with microvascular disease changes.   Vascular: No hyperdense vessel or unexpected calcification.    Skull: A suboccipital craniectomy defect is again seen.   Sinuses/Orbits: No acute finding.   Other: None.   IMPRESSION: 1. No acute intracranial abnormality. 2. Generalized cerebral atrophy and microvascular disease changes of the supratentorial brain. 3. Evidence of prior suboccipital decompression.  CT cervical spine wo contrast (03/10/22): FINDINGS: Alignment: There is straightening of the normal cervical spine lordosis. There is predominant stable approximately 1 mm anterolisthesis of the C3 vertebral body on C4.   Skull base and vertebrae: No acute fracture. A chronic versus congenital deformities are seen along the anterior arch and posterior arch of the C1 vertebral body.   Soft tissues and spinal canal: No prevertebral fluid or swelling. No visible canal hematoma.   Disc levels: Mild endplate sclerosis and anterior osteophyte formation is seen at the level of C4-C5. Moderate to marked severity endplate sclerosis is seen at the levels of C5-C6, C6-C7 and C7-T1.   There is moderate severity narrowing of the anterior atlantoaxial articulation. Moderate to marked severity intervertebral disc space narrowing is seen at C5-C6, C6-C7 and C7-T1.   Mild, bilateral multilevel facet joint hypertrophy is noted.   Upper chest: Negative.   Other: A suboccipital craniectomy defect is seen.   IMPRESSION: 1. No acute fracture or traumatic malalignment of the cervical spine. 2. Moderate to marked severity multilevel degenerative changes, as described above. 3. Straightening of the normal cervical spine lordosis, which may be secondary to patient positioning or muscle spasm.  MRI left ankle wo contrast (09/25/2015): IMPRESSION: 1. Subcortical fracture of the distal calcaneus at the cuboid consistent with a stress fracture. Adjacent stress reaction in the lateral process of the talus. 2. Almost complete disruption of a chronically degenerated hypertrophied posterior  tibialis tendon. The tear is at the level of the medial malleolus. 3. Slight tenosynovitis of the peroneal tendons and of flexor hallucis longus and extensor digitorum longus tendons. 4. Moderate ankle joint effusion which may represent posttraumatic synovitis.  MRI brain w/wo contrast (01/11/2013): Findings: Again seen is suboccipital craniectomy and resection of  the posterior elements of C1-C2 related to previous tumor resection  at the posterior craniocervical junction region in the distant  past.  Today's study does not show any restricted diffusion to  indicate acute or subacute infarction.  There are chronic appearing  small vessel changes affecting the pons.  There are a few old  lacunar infarctions affecting the thalami, basal ganglia and mild  chronic small vessel changes affecting the cerebral hemispheric  white matter.  There has been only minimal progression since 2007.  No cortical or large vessel territory infarction.  There is no  evidence of residual or recurrent mass lesion.  No evidence of  hydrocephalus or extra-axial collection.  No skull or skull base  lesion.   IMPRESSION:  No acute finding.  No evidence of residual or recurrent ependymoma.  Chronic appearing small vessel changes of the pons, thalami, basal  ganglia and hemispheric white matter, minimally progressive since  2007.   ASSESSMENT: [name] is a 73 y.o. female who presents for evaluation of imbalance and falls. She has a relevant medical history of DM2, ependymoma s/p resection, HTN, fibromyalgia,  ESRD on dialysis, OA, anxiety. Her neurological examination is pertinent for diminished sensation in bilateral lower extremities in length dependent fashion. Available diagnostic data is significant for HbA1c of 6.5, BMP showing expected abnormalities related to ESRD, and MRI left ankle showing multiple abnormalities of the joint and tendons. Patient's symptoms are likely multifactorial with contributions  from known structural problems of left ankle, distal symmetric polyneuropathy (known risk factors include DM and ESRD), and possibly residuals of ependymoma resection. I will send labs to look for treatable causes of neuropathy. Patient has an upcoming MRI brain to ensure there is no change from prior ependymoma resection, which is reasonable.  PLAN: -Blood work: B6, B12, IFE -Will follow up on MRI brain in 04/2024 -Continue home PT exercises -Agree with orthotic for left foot -Continue lidocaine ointment as needed -Would try to avoid opioids if able as this will contribute to imbalance  -Return to clinic in 6 months  The impression above as well as the plan as outlined below were extensively discussed with the patient who voiced understanding. All questions were answered to their satisfaction.  The patient was counseled on pertinent fall precautions per the printed material provided today, and as noted under the "Patient Instructions" section below.  When available, results of the above investigations and possible further recommendations will be communicated to the patient via telephone/MyChart. Patient to call office if not contacted after expected testing turnaround time.   Total time spent reviewing records, interview, history/exam, documentation, and coordination of care on day of encounter:  60 min   Thank you for allowing me to participate in patient's care.  If I can answer any additional questions, I would be pleased to do so.  Rommie Coats, MD   CC: Plotnikov, Oakley Bellman, MD [address] [place] [address]  CC: Referring provider: Plotnikov, Oakley Bellman, MD [address] [place],  [place] [address]

## 2024-03-19 NOTE — Patient Instructions (Signed)
   Exercise #1: Loud, strong "ahhhh" x5-10; twice a day   Relax your shoulders (don't strain)  Big, deep breath (don't push)  Aim for a few seconds (don't force)

## 2024-03-20 ENCOUNTER — Telehealth: Payer: Self-pay | Admitting: Physical Therapy

## 2024-03-20 DIAGNOSIS — G8929 Other chronic pain: Secondary | ICD-10-CM | POA: Diagnosis not present

## 2024-03-20 DIAGNOSIS — D631 Anemia in chronic kidney disease: Secondary | ICD-10-CM | POA: Diagnosis not present

## 2024-03-20 DIAGNOSIS — N186 End stage renal disease: Secondary | ICD-10-CM | POA: Diagnosis not present

## 2024-03-20 DIAGNOSIS — R519 Headache, unspecified: Secondary | ICD-10-CM | POA: Diagnosis not present

## 2024-03-20 DIAGNOSIS — N2581 Secondary hyperparathyroidism of renal origin: Secondary | ICD-10-CM | POA: Diagnosis not present

## 2024-03-20 DIAGNOSIS — Z992 Dependence on renal dialysis: Secondary | ICD-10-CM | POA: Diagnosis not present

## 2024-03-20 NOTE — Telephone Encounter (Signed)
 Called pt and left VM informing her of provider being OOO tomorrow during her 2pm appointment and offering reschedule at 9:30 on 4/11. Informed pt to call back to reschedule.   Jill Alexanders Donnis Pecha, PT, DPT

## 2024-03-21 ENCOUNTER — Ambulatory Visit: Payer: Medicare Other | Admitting: Physical Therapy

## 2024-03-21 ENCOUNTER — Ambulatory Visit: Payer: Medicare Other
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
## 2024-03-22 DIAGNOSIS — R519 Headache, unspecified: Secondary | ICD-10-CM | POA: Diagnosis not present

## 2024-03-22 DIAGNOSIS — D631 Anemia in chronic kidney disease: Secondary | ICD-10-CM | POA: Diagnosis not present

## 2024-03-22 DIAGNOSIS — N2581 Secondary hyperparathyroidism of renal origin: Secondary | ICD-10-CM | POA: Diagnosis not present

## 2024-03-22 DIAGNOSIS — N186 End stage renal disease: Secondary | ICD-10-CM | POA: Diagnosis not present

## 2024-03-22 DIAGNOSIS — Z992 Dependence on renal dialysis: Secondary | ICD-10-CM | POA: Diagnosis not present

## 2024-03-22 DIAGNOSIS — G8929 Other chronic pain: Secondary | ICD-10-CM | POA: Diagnosis not present

## 2024-03-23 ENCOUNTER — Ambulatory Visit
Admission: RE | Admit: 2024-03-23 | Discharge: 2024-03-23 | Disposition: A | Discharge status: Home / Self Care | Source: Ambulatory Visit | Attending: Obstetrics and Gynecology | Admitting: Obstetrics and Gynecology

## 2024-03-23 ENCOUNTER — Ambulatory Visit: Admitting: Physical Therapy

## 2024-03-23 VITALS — BP 104/54 | HR 86

## 2024-03-23 DIAGNOSIS — N6310 Unspecified lump in the right breast, unspecified quadrant: Secondary | ICD-10-CM | POA: Diagnosis not present

## 2024-03-23 DIAGNOSIS — M21372 Foot drop, left foot: Secondary | ICD-10-CM

## 2024-03-23 DIAGNOSIS — R2681 Unsteadiness on feet: Secondary | ICD-10-CM | POA: Diagnosis not present

## 2024-03-23 DIAGNOSIS — R490 Dysphonia: Secondary | ICD-10-CM | POA: Diagnosis not present

## 2024-03-23 DIAGNOSIS — R296 Repeated falls: Secondary | ICD-10-CM

## 2024-03-23 DIAGNOSIS — N641 Fat necrosis of breast: Secondary | ICD-10-CM | POA: Diagnosis not present

## 2024-03-23 DIAGNOSIS — M6281 Muscle weakness (generalized): Secondary | ICD-10-CM | POA: Diagnosis not present

## 2024-03-23 DIAGNOSIS — R2689 Other abnormalities of gait and mobility: Secondary | ICD-10-CM
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
## 2024-03-24 DIAGNOSIS — N186 End stage renal disease: Secondary | ICD-10-CM | POA: Diagnosis not present

## 2024-03-24 DIAGNOSIS — R519 Headache, unspecified: Secondary | ICD-10-CM | POA: Diagnosis not present

## 2024-03-24 DIAGNOSIS — N2581 Secondary hyperparathyroidism of renal origin: Secondary | ICD-10-CM | POA: Diagnosis not present

## 2024-03-24 DIAGNOSIS — D631 Anemia in chronic kidney disease: Secondary | ICD-10-CM | POA: Diagnosis not present

## 2024-03-24 DIAGNOSIS — G8929 Other chronic pain: Secondary | ICD-10-CM | POA: Diagnosis not present

## 2024-03-24 DIAGNOSIS — Z992 Dependence on renal dialysis: Secondary | ICD-10-CM | POA: Diagnosis not present

## 2024-03-26 ENCOUNTER — Other Ambulatory Visit (HOSPITAL_COMMUNITY): Payer: Self-pay

## 2024-03-27 DIAGNOSIS — R519 Headache, unspecified: Secondary | ICD-10-CM | POA: Diagnosis not present

## 2024-03-27 DIAGNOSIS — Z992 Dependence on renal dialysis: Secondary | ICD-10-CM | POA: Diagnosis not present

## 2024-03-27 DIAGNOSIS — N2581 Secondary hyperparathyroidism of renal origin: Secondary | ICD-10-CM | POA: Diagnosis not present

## 2024-03-27 DIAGNOSIS — D631 Anemia in chronic kidney disease: Secondary | ICD-10-CM | POA: Diagnosis not present

## 2024-03-27 DIAGNOSIS — G8929 Other chronic pain: Secondary | ICD-10-CM | POA: Diagnosis not present

## 2024-03-27 DIAGNOSIS — N186 End stage renal disease: Secondary | ICD-10-CM | POA: Diagnosis not present

## 2024-03-28 ENCOUNTER — Other Ambulatory Visit

## 2024-03-28 ENCOUNTER — Encounter: Payer: Self-pay | Admitting: Neurology

## 2024-03-28 ENCOUNTER — Ambulatory Visit: Admitting: Neurology

## 2024-03-28 VITALS — BP 135/82 | HR 89 | Ht 62.5 in | Wt 193.0 lb

## 2024-03-28 DIAGNOSIS — E569 Vitamin deficiency, unspecified: Secondary | ICD-10-CM | POA: Diagnosis not present

## 2024-03-28 DIAGNOSIS — I951 Orthostatic hypotension: Secondary | ICD-10-CM

## 2024-03-28 DIAGNOSIS — R296 Repeated falls: Secondary | ICD-10-CM

## 2024-03-28 DIAGNOSIS — G629 Polyneuropathy, unspecified: Secondary | ICD-10-CM | POA: Diagnosis not present

## 2024-03-28 DIAGNOSIS — C719 Malignant neoplasm of brain, unspecified: Secondary | ICD-10-CM

## 2024-03-28 DIAGNOSIS — R2689 Other abnormalities of gait and mobility: Secondary | ICD-10-CM

## 2024-03-28 NOTE — Patient Instructions (Addendum)
 I saw you today for imbalance and falls. 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  I would like to get blood work today. I will be in touch when I have those results.  I will follow up on your upcoming MRI brain as well.  In the meantime, I need you to try to be as safe as possible and avoid falls (see more information on this below).  Continue home PT exercises. I encourage you to get the orthotic for the left foot as you are planning.  The physicians and staff at [facility] are committed to providing excellent care. You may receive a survey requesting feedback about your experience at our office. We strive to receive "very good" responses to the survey questions. If you feel that your experience would prevent you from giving the office a "very good " response, please contact our office to try to remedy the situation. We may be reached at [phone]. Thank you for taking the time out of your busy day to complete the survey.  Rommie Coats, MD [facility]  Preventing Falls at [facility] are common, often dreaded events in the lives of older people. Aside from the obvious injuries and even death that may result, fall can cause wide-ranging consequences including loss of independence, mental decline, decreased activity and mobility. Younger people are also at risk of falling, especially those with chronic illnesses and fatigue.  Ways to reduce risk for falling Examine diet and medications. Warm foods and alcohol dilate blood vessels, which can lead to dizziness when standing. Sleep aids, antidepressants and pain medications can also increase the likelihood of a fall.  Get a vision exam. Poor vision, cataracts and glaucoma increase the chances of falling.  Check foot gear. Shoes should fit snugly and have a sturdy, nonskid  sole and a broad, low heel  Participate in a physician-approved exercise program to build and maintain muscle strength and improve balance and coordination. Programs that use ankle weights or stretch bands are excellent for muscle-strengthening. Water aerobics programs and low-impact Tai Chi programs have also been shown to improve balance and coordination.  Increase vitamin D intake. Vitamin D improves muscle strength and increases the amount of calcium the body is able to absorb and deposit in bones.  How to prevent falls from common hazards Floors - Remove all loose wires, cords, and throw rugs. Minimize clutter. Make sure rugs are anchored and smooth. Keep furniture in its usual place.  Chairs -- Use chairs with straight backs, armrests and firm seats. Add firm cushions to existing pieces to add height.  Bathroom - Install grab bars and non-skid tape in the tub or shower. Use a bathtub transfer bench or a shower chair with a back support Use an elevated toilet seat and/or safety rails to assist standing from a low surface. Do not use towel racks or bathroom tissue holders to help you stand.  Lighting - Make sure halls, stairways, and entrances are well-lit. Install a night light in your bathroom or hallway. Make sure there is a light switch at the top and bottom of the staircase. Turn lights on if you get up in the middle of the night. Make sure lamps or light switches are within reach of the bed if you have to get up during the night.  Kitchen - Install non-skid rubber mats  near the sink and stove. Clean spills immediately. Store frequently used utensils, pots, pans between waist and eye level. This helps prevent reaching and bending. Sit when getting things out of lower cupboards.  Living room/ Bedrooms - Place furniture with wide spaces in between, giving enough room to move around. Establish a route through the living room that gives you something to hold onto as you walk.  Stairs - Make  sure treads, rails, and rugs are secure. Install a rail on both sides of the stairs. If stairs are a threat, it might be helpful to arrange most of your activities on the lower level to reduce the number of times you must climb the stairs.  Entrances and doorways - Install metal handles on the walls adjacent to the doorknobs of all doors to make it more secure as you travel through the doorway.  Tips for maintaining balance Keep at least one hand free at all times. Try using a backpack or fanny pack to hold things rather than carrying them in your hands. Never carry objects in both hands when walking as this interferes with keeping your balance.  Attempt to swing both arms from front to back while walking. This might require a conscious effort if Parkinson's disease has diminished your movement. It will, however, help you to maintain balance and posture, and reduce fatigue.  Consciously lift your feet off of the ground when walking. Shuffling and dragging of the feet is a common culprit in losing your balance.  When trying to navigate turns, use a "U" technique of facing forward and making a wide turn, rather than pivoting sharply.  Try to stand with your feet shoulder-length apart. When your feet are close together for any length of time, you increase your risk of losing your balance and falling.  Do one thing at a time. Don't try to walk and accomplish another task, such as reading or looking around. The decrease in your automatic reflexes complicates motor function, so the less distraction, the better.  Do not wear rubber or gripping soled shoes, they might "catch" on the floor and cause tripping.  Move slowly when changing positions. Use deliberate, concentrated movements and, if needed, use a grab bar or walking aid. Count 15 seconds between each movement. For example, when rising from a seated position, wait 15 seconds after standing to begin walking.  If balance is a continuous problem,  you might want to consider a walking aid such as a cane, walking stick, or walker. Once you've mastered walking with help, you might be ready to try it on your own again.

## 2024-03-29 DIAGNOSIS — N186 End stage renal disease: Secondary | ICD-10-CM | POA: Diagnosis not present

## 2024-03-29 DIAGNOSIS — Z992 Dependence on renal dialysis: Secondary | ICD-10-CM | POA: Diagnosis not present

## 2024-03-31 DIAGNOSIS — Z992 Dependence on renal dialysis: Secondary | ICD-10-CM | POA: Diagnosis not present

## 2024-03-31 DIAGNOSIS — N186 End stage renal disease: Secondary | ICD-10-CM | POA: Diagnosis not present

## 2024-04-03 DIAGNOSIS — Z992 Dependence on renal dialysis: Secondary | ICD-10-CM | POA: Diagnosis not present

## 2024-04-03 DIAGNOSIS — N186 End stage renal disease: Secondary | ICD-10-CM | POA: Diagnosis not present

## 2024-04-03 LAB — IMMUNOFIXATION ELECTROPHORESIS
IgM, Serum: 52 mg/dL (ref 50–300)
IgM, Serum: 768 mg/dL (ref 600–300)
Immunoglobulin A: 158 mg/dL (ref 70–320)
Immunoglobulin A: 768 mg/dL (ref 70–320)

## 2024-04-03 LAB — VITAMIN B6: Vitamin B6: 30.2 ng/mL — ABNORMAL HIGH (ref 2.1–21.7)

## 2024-04-03 LAB — VITAMIN B12: Vitamin B-12: 472 pg/mL (ref 200–1100)

## 2024-04-04 ENCOUNTER — Other Ambulatory Visit (HOSPITAL_COMMUNITY): Payer: Self-pay

## 2024-04-04 ENCOUNTER — Other Ambulatory Visit: Payer: Self-pay | Admitting: Obstetrics and Gynecology

## 2024-04-04 DIAGNOSIS — Z1231 Encounter for screening mammogram for malignant neoplasm of breast: Secondary | ICD-10-CM

## 2024-04-05 ENCOUNTER — Telehealth: Payer: Self-pay | Admitting: Neurology

## 2024-04-05 DIAGNOSIS — D631 Anemia in chronic kidney disease: Secondary | ICD-10-CM | POA: Diagnosis not present

## 2024-04-05 DIAGNOSIS — G8929 Other chronic pain: Secondary | ICD-10-CM | POA: Diagnosis not present

## 2024-04-05 DIAGNOSIS — N2581 Secondary hyperparathyroidism of renal origin: Secondary | ICD-10-CM | POA: Diagnosis not present

## 2024-04-05 DIAGNOSIS — Z992 Dependence on renal dialysis: Secondary | ICD-10-CM | POA: Diagnosis not present

## 2024-04-05 DIAGNOSIS — N186 End stage renal disease: Secondary | ICD-10-CM | POA: Diagnosis not present

## 2024-04-05 DIAGNOSIS — R519 Headache, unspecified: Secondary | ICD-10-CM | POA: Diagnosis not present

## 2024-04-05 NOTE — Telephone Encounter (Signed)
 Pt. Been out of town, calling for results

## 2024-04-05 NOTE — Telephone Encounter (Signed)
 Per Dr. Genita Keys- Can we let patient know that her labs were essentially normal? Her B6 was a little high, but this is likely related to not being done fasting and is not high enough to cause concern.    Called patient and informed her of above. Patient verbalized understanding and had no further questions or concerns,.

## 2024-04-06 ENCOUNTER — Other Ambulatory Visit: Payer: Self-pay

## 2024-04-06 ENCOUNTER — Other Ambulatory Visit (HOSPITAL_COMMUNITY): Payer: Self-pay

## 2024-04-06 DIAGNOSIS — G5621 Lesion of ulnar nerve, right upper limb: Secondary | ICD-10-CM | POA: Diagnosis not present

## 2024-04-06 DIAGNOSIS — S42431D Displaced fracture (avulsion) of lateral epicondyle of right humerus, subsequent encounter for fracture with routine healing: Secondary | ICD-10-CM | POA: Diagnosis not present

## 2024-04-07 DIAGNOSIS — N186 End stage renal disease: Secondary | ICD-10-CM | POA: Diagnosis not present

## 2024-04-07 DIAGNOSIS — D631 Anemia in chronic kidney disease: Secondary | ICD-10-CM | POA: Diagnosis not present

## 2024-04-07 DIAGNOSIS — R519 Headache, unspecified: Secondary | ICD-10-CM | POA: Diagnosis not present

## 2024-04-07 DIAGNOSIS — Z992 Dependence on renal dialysis: Secondary | ICD-10-CM | POA: Diagnosis not present

## 2024-04-07 DIAGNOSIS — G8929 Other chronic pain: Secondary | ICD-10-CM | POA: Diagnosis not present

## 2024-04-07 DIAGNOSIS — N2581 Secondary hyperparathyroidism of renal origin: Secondary | ICD-10-CM | POA: Diagnosis not present

## 2024-04-10 DIAGNOSIS — N2581 Secondary hyperparathyroidism of renal origin: Secondary | ICD-10-CM | POA: Diagnosis not present

## 2024-04-10 DIAGNOSIS — R519 Headache, unspecified: Secondary | ICD-10-CM | POA: Diagnosis not present

## 2024-04-10 DIAGNOSIS — G8929 Other chronic pain: Secondary | ICD-10-CM | POA: Diagnosis not present

## 2024-04-10 DIAGNOSIS — Z992 Dependence on renal dialysis: Secondary | ICD-10-CM | POA: Diagnosis not present

## 2024-04-10 DIAGNOSIS — D631 Anemia in chronic kidney disease: Secondary | ICD-10-CM | POA: Diagnosis not present

## 2024-04-10 DIAGNOSIS — N186 End stage renal disease: Secondary | ICD-10-CM | POA: Diagnosis not present

## 2024-04-11 DIAGNOSIS — M21372 Foot drop, left foot: Secondary | ICD-10-CM | POA: Diagnosis not present

## 2024-04-11 DIAGNOSIS — N186 End stage renal disease: Secondary | ICD-10-CM | POA: Diagnosis not present

## 2024-04-11 DIAGNOSIS — Z992 Dependence on renal dialysis: Secondary | ICD-10-CM | POA: Diagnosis not present

## 2024-04-11 DIAGNOSIS — E1022 Type 1 diabetes mellitus with diabetic chronic kidney disease: Secondary | ICD-10-CM | POA: Diagnosis not present

## 2024-04-12 DIAGNOSIS — R519 Headache, unspecified: Secondary | ICD-10-CM | POA: Diagnosis not present

## 2024-04-12 DIAGNOSIS — G8929 Other chronic pain: Secondary | ICD-10-CM | POA: Diagnosis not present

## 2024-04-12 DIAGNOSIS — Z992 Dependence on renal dialysis: Secondary | ICD-10-CM | POA: Diagnosis not present

## 2024-04-12 DIAGNOSIS — N2581 Secondary hyperparathyroidism of renal origin: Secondary | ICD-10-CM | POA: Diagnosis not present

## 2024-04-12 DIAGNOSIS — N186 End stage renal disease: Secondary | ICD-10-CM | POA: Diagnosis not present

## 2024-04-12 DIAGNOSIS — D631 Anemia in chronic kidney disease: Secondary | ICD-10-CM | POA: Diagnosis not present

## 2024-04-14 DIAGNOSIS — N2581 Secondary hyperparathyroidism of renal origin: Secondary | ICD-10-CM | POA: Diagnosis not present

## 2024-04-14 DIAGNOSIS — N186 End stage renal disease: Secondary | ICD-10-CM | POA: Diagnosis not present

## 2024-04-14 DIAGNOSIS — G8929 Other chronic pain: Secondary | ICD-10-CM | POA: Diagnosis not present

## 2024-04-14 DIAGNOSIS — R519 Headache, unspecified: Secondary | ICD-10-CM | POA: Diagnosis not present

## 2024-04-14 DIAGNOSIS — M5030 Other cervical disc degeneration, unspecified cervical region: Secondary | ICD-10-CM | POA: Diagnosis not present

## 2024-04-14 DIAGNOSIS — S42401A Unspecified fracture of lower end of right humerus, initial encounter for closed fracture: Secondary | ICD-10-CM | POA: Diagnosis not present

## 2024-04-14 DIAGNOSIS — E1140 Type 2 diabetes mellitus with diabetic neuropathy, unspecified: Secondary | ICD-10-CM | POA: Diagnosis not present

## 2024-04-14 DIAGNOSIS — D631 Anemia in chronic kidney disease: Secondary | ICD-10-CM | POA: Diagnosis not present

## 2024-04-14 DIAGNOSIS — Z992 Dependence on renal dialysis: Secondary | ICD-10-CM | POA: Diagnosis not present

## 2024-04-16 ENCOUNTER — Ambulatory Visit: Payer: Medicare Other | Admitting: Internal Medicine

## 2024-04-17 DIAGNOSIS — G8929 Other chronic pain: Secondary | ICD-10-CM | POA: Diagnosis not present

## 2024-04-17 DIAGNOSIS — N186 End stage renal disease: Secondary | ICD-10-CM | POA: Diagnosis not present

## 2024-04-17 DIAGNOSIS — Z992 Dependence on renal dialysis: Secondary | ICD-10-CM | POA: Diagnosis not present

## 2024-04-17 DIAGNOSIS — R519 Headache, unspecified: Secondary | ICD-10-CM | POA: Diagnosis not present

## 2024-04-17 DIAGNOSIS — N2581 Secondary hyperparathyroidism of renal origin: Secondary | ICD-10-CM | POA: Diagnosis not present

## 2024-04-17 DIAGNOSIS — D631 Anemia in chronic kidney disease: Secondary | ICD-10-CM | POA: Diagnosis not present

## 2024-04-19 DIAGNOSIS — R519 Headache, unspecified: Secondary | ICD-10-CM | POA: Diagnosis not present

## 2024-04-19 DIAGNOSIS — D631 Anemia in chronic kidney disease: Secondary | ICD-10-CM | POA: Diagnosis not present

## 2024-04-19 DIAGNOSIS — N2581 Secondary hyperparathyroidism of renal origin: Secondary | ICD-10-CM | POA: Diagnosis not present

## 2024-04-19 DIAGNOSIS — Z992 Dependence on renal dialysis: Secondary | ICD-10-CM | POA: Diagnosis not present

## 2024-04-19 DIAGNOSIS — G8929 Other chronic pain: Secondary | ICD-10-CM | POA: Diagnosis not present

## 2024-04-19 DIAGNOSIS — N186 End stage renal disease: Secondary | ICD-10-CM | POA: Diagnosis not present

## 2024-04-21 DIAGNOSIS — D631 Anemia in chronic kidney disease: Secondary | ICD-10-CM | POA: Diagnosis not present

## 2024-04-21 DIAGNOSIS — R519 Headache, unspecified: Secondary | ICD-10-CM | POA: Diagnosis not present

## 2024-04-21 DIAGNOSIS — N2581 Secondary hyperparathyroidism of renal origin: Secondary | ICD-10-CM | POA: Diagnosis not present

## 2024-04-21 DIAGNOSIS — Z992 Dependence on renal dialysis: Secondary | ICD-10-CM | POA: Diagnosis not present

## 2024-04-21 DIAGNOSIS — N186 End stage renal disease: Secondary | ICD-10-CM | POA: Diagnosis not present

## 2024-04-21 DIAGNOSIS — G8929 Other chronic pain: Secondary | ICD-10-CM | POA: Diagnosis not present

## 2024-04-23 ENCOUNTER — Ambulatory Visit
Admission: RE | Admit: 2024-04-23 | Discharge: 2024-04-23 | Disposition: A | Discharge status: Home / Self Care | Source: Ambulatory Visit | Attending: Internal Medicine | Admitting: Internal Medicine

## 2024-04-23 DIAGNOSIS — I6782 Cerebral ischemia: Secondary | ICD-10-CM | POA: Diagnosis not present

## 2024-04-23 DIAGNOSIS — I6389 Other cerebral infarction: Secondary | ICD-10-CM | POA: Diagnosis not present

## 2024-04-23 DIAGNOSIS — D332 Benign neoplasm of brain, unspecified: Secondary | ICD-10-CM

## 2024-04-23 DIAGNOSIS — R296 Repeated falls: Secondary | ICD-10-CM

## 2024-04-23 DIAGNOSIS — R270 Ataxia, unspecified: Secondary | ICD-10-CM | POA: Diagnosis not present

## 2024-04-23 MED ORDER — GADOPICLENOL 0.5 MMOL/ML IV SOLN
9.0000 mL | Freq: Once | INTRAVENOUS | Status: AC | PRN
  Administered 2024-04-23: 9 mL via INTRAVENOUS

## 2024-04-24 DIAGNOSIS — G8929 Other chronic pain: Secondary | ICD-10-CM | POA: Diagnosis not present

## 2024-04-24 DIAGNOSIS — N186 End stage renal disease: Secondary | ICD-10-CM | POA: Diagnosis not present

## 2024-04-24 DIAGNOSIS — Z992 Dependence on renal dialysis: Secondary | ICD-10-CM | POA: Diagnosis not present

## 2024-04-24 DIAGNOSIS — N2581 Secondary hyperparathyroidism of renal origin: Secondary | ICD-10-CM | POA: Diagnosis not present

## 2024-04-24 DIAGNOSIS — R519 Headache, unspecified: Secondary | ICD-10-CM | POA: Diagnosis not present

## 2024-04-24 DIAGNOSIS — D631 Anemia in chronic kidney disease: Secondary | ICD-10-CM | POA: Diagnosis not present

## 2024-04-26 DIAGNOSIS — N2581 Secondary hyperparathyroidism of renal origin: Secondary | ICD-10-CM | POA: Diagnosis not present

## 2024-04-26 DIAGNOSIS — G8929 Other chronic pain: Secondary | ICD-10-CM | POA: Diagnosis not present

## 2024-04-26 DIAGNOSIS — N186 End stage renal disease: Secondary | ICD-10-CM | POA: Diagnosis not present

## 2024-04-26 DIAGNOSIS — D631 Anemia in chronic kidney disease: Secondary | ICD-10-CM | POA: Diagnosis not present

## 2024-04-26 DIAGNOSIS — Z992 Dependence on renal dialysis: Secondary | ICD-10-CM | POA: Diagnosis not present

## 2024-04-26 DIAGNOSIS — R519 Headache, unspecified: Secondary | ICD-10-CM | POA: Diagnosis not present

## 2024-04-27 ENCOUNTER — Ambulatory Visit: Payer: Self-pay | Admitting: Internal Medicine

## 2024-04-27 ENCOUNTER — Telehealth: Payer: Self-pay | Admitting: Neurology

## 2024-04-27 DIAGNOSIS — M899 Disorder of bone, unspecified: Secondary | ICD-10-CM

## 2024-04-27 DIAGNOSIS — R296 Repeated falls: Secondary | ICD-10-CM

## 2024-04-27 DIAGNOSIS — N186 End stage renal disease: Secondary | ICD-10-CM

## 2024-04-27 DIAGNOSIS — E669 Obesity, unspecified: Secondary | ICD-10-CM

## 2024-04-27 MED ORDER — ASPIRIN 81 MG PO TBEC: 81.0000 mg | DELAYED_RELEASE_TABLET | Freq: Every day | ORAL | Status: DC

## 2024-04-27 NOTE — Telephone Encounter (Signed)
 Pt. Wants to klnow why PCP ordered another MRI, explained to contact the Provider that order it. But she wants Dr. Genita Keys to provide her results on both MRI's order ,one with him and other by her PCP. 1st number to call [phone]  and if not avail try home phone  [phone]

## 2024-04-28 DIAGNOSIS — Z992 Dependence on renal dialysis: Secondary | ICD-10-CM | POA: Diagnosis not present

## 2024-04-28 DIAGNOSIS — N186 End stage renal disease: Secondary | ICD-10-CM | POA: Diagnosis not present

## 2024-04-28 DIAGNOSIS — R519 Headache, unspecified: Secondary | ICD-10-CM | POA: Diagnosis not present

## 2024-04-28 DIAGNOSIS — G8929 Other chronic pain: Secondary | ICD-10-CM | POA: Diagnosis not present

## 2024-04-28 DIAGNOSIS — N2581 Secondary hyperparathyroidism of renal origin: Secondary | ICD-10-CM | POA: Diagnosis not present

## 2024-04-28 DIAGNOSIS — D631 Anemia in chronic kidney disease: Secondary | ICD-10-CM | POA: Diagnosis not present

## 2024-04-30 ENCOUNTER — Telehealth: Payer: Self-pay | Admitting: Internal Medicine

## 2024-04-30 NOTE — Telephone Encounter (Signed)
 I returned patient's call and discussed MRI brain and reason for MRI cervical spine.  MRI brain showed old strokes in areas of small vessels and also near area of old surgery.  Her cervical spine was also partially visible and had abnormal marrow signal and endplate enhancement at C4-5. I explained it was unclear the cause, so that is why radiology recommended, and PCP ordered, MRI cervical spine.  She understood and will get MRI cervical spine.  All questions were answered.  Rommie Coats, MD [facility]

## 2024-04-30 NOTE — Telephone Encounter (Unsigned)
 Copied from CRM [id number]. Topic: Clinical - Request for Lab/Test Order >> Apr 30, 2024  1:52 PM Chasity T wrote: Reason for CRM: Patient has a scheduled appointment to get imaging done and is concerned as to why she needs it done and wants to know what is going on with her. She would like for someone to call her back at [phone]

## 2024-05-01 ENCOUNTER — Other Ambulatory Visit: Payer: Self-pay | Admitting: Cardiovascular Disease

## 2024-05-01 DIAGNOSIS — Z992 Dependence on renal dialysis: Secondary | ICD-10-CM | POA: Diagnosis not present

## 2024-05-01 DIAGNOSIS — D631 Anemia in chronic kidney disease: Secondary | ICD-10-CM | POA: Diagnosis not present

## 2024-05-01 DIAGNOSIS — I700 Atherosclerosis of aorta: Secondary | ICD-10-CM

## 2024-05-01 DIAGNOSIS — R519 Headache, unspecified: Secondary | ICD-10-CM | POA: Diagnosis not present

## 2024-05-01 DIAGNOSIS — G8929 Other chronic pain: Secondary | ICD-10-CM | POA: Diagnosis not present

## 2024-05-01 DIAGNOSIS — N186 End stage renal disease: Secondary | ICD-10-CM | POA: Diagnosis not present

## 2024-05-01 DIAGNOSIS — N2581 Secondary hyperparathyroidism of renal origin: Secondary | ICD-10-CM | POA: Diagnosis not present

## 2024-05-01 DIAGNOSIS — I2510 Atherosclerotic heart disease of native coronary artery without angina pectoris: Secondary | ICD-10-CM

## 2024-05-01 DIAGNOSIS — E7800 Pure hypercholesterolemia, unspecified: Secondary | ICD-10-CM

## 2024-05-02 ENCOUNTER — Telehealth (INDEPENDENT_AMBULATORY_CARE_PROVIDER_SITE_OTHER): Payer: Self-pay | Admitting: Otolaryngology

## 2024-05-02 NOTE — Telephone Encounter (Signed)
 Called & spoke w/ patient to confirm appt date, time & location; Reminder text sent also

## 2024-05-03 DIAGNOSIS — N186 End stage renal disease: Secondary | ICD-10-CM | POA: Diagnosis not present

## 2024-05-03 DIAGNOSIS — R519 Headache, unspecified: Secondary | ICD-10-CM | POA: Diagnosis not present

## 2024-05-03 DIAGNOSIS — N2581 Secondary hyperparathyroidism of renal origin: Secondary | ICD-10-CM | POA: Diagnosis not present

## 2024-05-03 DIAGNOSIS — D631 Anemia in chronic kidney disease: Secondary | ICD-10-CM | POA: Diagnosis not present

## 2024-05-03 DIAGNOSIS — G8929 Other chronic pain: Secondary | ICD-10-CM | POA: Diagnosis not present

## 2024-05-03 DIAGNOSIS — Z992 Dependence on renal dialysis: Secondary | ICD-10-CM | POA: Diagnosis not present

## 2024-05-04 ENCOUNTER — Encounter (INDEPENDENT_AMBULATORY_CARE_PROVIDER_SITE_OTHER): Payer: Self-pay | Admitting: Otolaryngology

## 2024-05-04 ENCOUNTER — Ambulatory Visit (INDEPENDENT_AMBULATORY_CARE_PROVIDER_SITE_OTHER): Admitting: Otolaryngology

## 2024-05-04 VITALS — BP 147/70 | HR 86

## 2024-05-04 DIAGNOSIS — K219 Gastro-esophageal reflux disease without esophagitis: Secondary | ICD-10-CM | POA: Diagnosis not present

## 2024-05-04 DIAGNOSIS — Z87891 Personal history of nicotine dependence: Secondary | ICD-10-CM

## 2024-05-04 DIAGNOSIS — R0982 Postnasal drip: Secondary | ICD-10-CM

## 2024-05-04 DIAGNOSIS — J343 Hypertrophy of nasal turbinates: Secondary | ICD-10-CM

## 2024-05-04 DIAGNOSIS — J342 Deviated nasal septum: Secondary | ICD-10-CM

## 2024-05-04 DIAGNOSIS — R682 Dry mouth, unspecified: Secondary | ICD-10-CM

## 2024-05-04 DIAGNOSIS — J383 Other diseases of vocal cords: Secondary | ICD-10-CM | POA: Diagnosis not present

## 2024-05-04 DIAGNOSIS — J3089 Other allergic rhinitis: Secondary | ICD-10-CM

## 2024-05-04 DIAGNOSIS — J3489 Other specified disorders of nose and nasal sinuses: Secondary | ICD-10-CM

## 2024-05-04 DIAGNOSIS — R0981 Nasal congestion: Secondary | ICD-10-CM

## 2024-05-04 DIAGNOSIS — R490 Dysphonia: Secondary | ICD-10-CM | POA: Diagnosis not present

## 2024-05-04 MED ORDER — FAMOTIDINE 20 MG PO TABS: 20.0000 mg | ORAL_TABLET | Freq: Two times a day (BID) | ORAL | 3 refills | Status: DC

## 2024-05-04 MED ORDER — SALINE SPRAY 0.65 % NA SOLN: 1.0000 | NASAL | 5 refills | Status: DC | PRN

## 2024-05-04 NOTE — Progress Notes (Unsigned)
 ENT Progress Note:   Update 05/04/24  Discussed the use of AI scribe software for clinical note transcription with the patient, who gave verbal consent to proceed.  History of Present Illness [name] is a 73 year old female with hx of dysphonia and vocal cord atrophy who presents for f/u after voice therapy.  She found voice therapy helpful, but continues to experience scratchy voice that worsens in the winter due to dry air. Using a vaporizer at night provides some relief. She uses Astelin  nasal spray but has reduced its frequency to once a day due to epistaxis. She has not used nasal saline sprays.  She continues to take Protonix  for reflux, which she finds helpful. She has been on various reflux medications since the early 1980s, including Prilosec, which caused daily headaches.She uses [employer].   She experiences significant dry mouth, which she attributes to her medications and limited fluid intake (on dialysis). She is currently taking pilocarpine  but is unsure of its effectiveness. She has not tried Xylimelts. .   Records Reviewed:  Initial Evaluation  Reason for Consult: chronic dysphonia since Spring 2024   HPI: Discussed the use of AI scribe software for clinical note transcription with the patient, who gave verbal consent to proceed.  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 The voice changes, which have been present for several months, are more pronounced with prolonged talking. The patient also reports a sensation of postnasal drainage and frequent mouth breathing, particularly at night, which she believes contributes to her dry mouth. She has been on pilocarpine  for dry mouth in the past, but reports minimal relief.  The  patient also has a history of heartburn, managed with twice-daily Protonix  40 mg, which she reports helps control her symptoms. She has a history of smoking, but quit in 1990. She reports occasional coughing, which she attributes to postnasal drainage, but denies any shortness of breath or swallowing difficulties.  The patient also reports a history of joint pain, for which she took NSAIDs daily for an extended period. She believes this, in combination with her diabetes, contributed to her current kidney disease. She also reports neuropathy in her feet, which has led to multiple falls and fractures.  The patient underwent septoplasty in the 1970s and sinus surgery in the late 1980s, but reports persistent nasal congestion/nasal obstruction and postnasal drainage. 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     Records Reviewed:  PCP office visit Dr [name] 10/18/23 Type 2 diabetes mellitus with other diabetic kidney complication ([name])       Ozempic  was $500 in May - on 0.5 mg/wk On Actos , glimepiride  A1c 6.3%        Anemia      On HD        Relevant Medications    Methoxy PEG-Epoetin  Beta (MIRCERA IJ)    Depression      Chronic On Wellbutrin  XL, Cymbalta         Essential hypertension      Low BP - on Midodrin on HD days        Falls      Pt fell x4 in the past 12 months Using a cane  Change of voice - Primary      C/o voice change x 6 months ENT ref        Relevant Orders    Ambulatory referral to ENT     Past Medical History:  Diagnosis Date   Anemia    Anxiety    Brain tumor (benign) ([name])    Colitis 2010   microscopic- Dr Elvin Hammer   Depression    Diabetes mellitus    type II   Dyspnea    with exertion   ESRD (end stage renal disease) ([name])    TTUSAT [address]    Fibromyalgia    GERD (gastroesophageal reflux disease)    Headache    History of blood transfusion    after  knee surgery   Hypertension    discontinued all diuretics and antihypertensives   IBS (irritable bowel syndrome)    LBP (low back pain)    Neuropathy    feet bilat    Osteoarthritis    Osteopenia    Pneumonia    hx of 2014    Rotator cuff tear, right    Sinusitis    currently being treated with antibiotic will complete 03/04/2015    Past Surgical History:  Procedure Laterality Date   A/V FISTULAGRAM N/A 02/03/2024   Procedure: A/V Fistulagram;  Surgeon: Baron Border, MD;  Location: [facility];  Service: Cardiovascular;  Laterality: N/A;   AV FISTULA PLACEMENT Right 09/12/2020   Procedure: RIGHT ARTERIOVENOUS (AV) FISTULA CREATION;  Surgeon: Richrd Char, MD;  Location: [facility] OR;  Service: Vascular;  Laterality: Right;   BASCILIC VEIN TRANSPOSITION Right 10/27/2020   Procedure: RIGHT UPPER EXTREMITY SECOND STAGE BASCILIC VEIN TRANSPOSITION;  Surgeon: Richrd Char, MD;  Location: [facility] OR;  Service: Vascular;  Laterality: Right;   BIOPSY  03/11/2022   Procedure: BIOPSY;  Surgeon: Normie Becton., MD;  Location: [facility] ENDOSCOPY;  Service: Gastroenterology;;   COLONOSCOPY WITH PROPOFOL  N/A 03/11/2022   Procedure: COLONOSCOPY WITH PROPOFOL ;  Surgeon: Normie Becton., MD;  Location: [facility] ENDOSCOPY;  Service: Gastroenterology;  Laterality: N/A;   foramen magnum ependymoma surgery  2003   Dr Lawson Prey   JOINT REPLACEMENT Bilateral    NASAL SINUS SURGERY     1973    PERIPHERAL VASCULAR BALLOON ANGIOPLASTY  02/03/2024   Procedure: PERIPHERAL VASCULAR BALLOON ANGIOPLASTY;  Surgeon: Baron Border, MD;  Location: [facility];  Service: Cardiovascular;;  Inflow Basilic Vein   REVERSE SHOULDER ARTHROPLASTY Right 06/03/2022   Procedure: REVERSE SHOULDER ARTHROPLASTY;  Surgeon: Ellard Gunning, MD;  Location: [facility] ORS;  Service: Orthopedics;  Laterality: Right;    TONSILLECTOMY     TOTAL KNEE ARTHROPLASTY     L 2008, R 2009, R 2016- Dr Rossie Coon   TOTAL KNEE  REVISION Right 03/05/2015   Procedure: RIGHT TOTAL KNEE ARTHROPLASTY REVISION;  Surgeon: Liliane Rei, MD;  Location: [facility] ORS;  Service: Orthopedics;  Laterality: Right;    Family History  Problem Relation Age of Onset   Depression Mother    Hypertension Mother    Stroke Mother 58   Diabetes Mother    Diabetes Father    Hyperlipidemia Father    Hypertension Father    Crohn's disease Maternal Uncle    Cancer Brother        Prostate   Diabetes Brother    Heart disease Brother    Hyperlipidemia Brother    Hypertension Brother    Diabetes Brother    Heart  disease Brother        Heart Disease before age 16   Heart attack Brother    Stroke Brother        X's 2   Hyperlipidemia Brother    Hypertension Brother    Diabetes Other    Colon cancer Neg Hx    Esophageal cancer Neg Hx    Stomach cancer Neg Hx    Pancreatic cancer Neg Hx    Breast cancer Neg Hx     Social History:  reports that she quit smoking about 35 years ago. Her smoking use included cigarettes. She started smoking about 55 years ago. She has a 20 pack-year smoking history. She has never used smokeless tobacco. She reports that she does not currently use alcohol . She reports that she does not use drugs.  Allergies:  Allergies  Allergen Reactions   Erythromycin Diarrhea and Nausea And Vomiting   Gabapentin Other (See Comments)    Confusion and falling  Hallucinations    Medications: I have reviewed the patient's current medications.  The PMH, PSH, Medications, Allergies, and SH were reviewed and updated.  ROS: Constitutional: Negative for fever, weight loss and weight gain. Cardiovascular: Negative for chest pain and dyspnea on exertion. Respiratory: Is not experiencing shortness of breath at rest. Gastrointestinal: Negative for nausea and vomiting. Neurological: Negative for headaches. Psychiatric: The patient is not nervous/anxious  Blood pressure (!) 147/70, pulse 86, SpO2 92%.  PHYSICAL  EXAM:  Exam: General: Well-developed, well-nourished Communication and Voice: slightly raspy Respiratory Respiratory effort: Equal inspiration and expiration without stridor Cardiovascular Peripheral Vascular: Warm extremities with equal color/perfusion Eyes: No nystagmus with equal extraocular motion bilaterally Neuro/Psych/Balance: Patient oriented to person, place, and time; Appropriate mood and affect; Gait is intact with no imbalance; Cranial nerves I-XII are intact Head and Face Inspection: Normocephalic and atraumatic without mass or lesion Palpation: Facial skeleton intact without bony stepoffs Salivary Glands: No mass or tenderness Facial Strength: Facial motility symmetric and full bilaterally ENT Pinna: External ear intact and fully developed External canal: Canal is patent with intact skin Tympanic Membrane: Clear and mobile External Nose: No scar or anatomic deformity Internal Nose: Septum is deviated with S-shaped septum and narrowing of b/l nasal passages worse on the left. No polyp, or purulence. Mucosal edema and erythema present.  Bilateral inferior turbinate hypertrophy.  Lips, Teeth, and gums: Mucosa and teeth intact and viable TMJ: No pain to palpation with full mobility Oral cavity/oropharynx: No erythema or exudate, no lesions present Nasopharynx: No mass or lesion with intact mucosa Hypopharynx: Intact mucosa without pooling of secretions Larynx Glottic: Full true vocal cord mobility without lesion or mass, previously seen VF atrophy and glottic insufficiency Supraglottic: Normal appearing epiglottis and AE folds Interarytenoid Space: Moderate to severe pachydermia&edema Subglottic Space: Patent without lesion or edema Neck Neck and Trachea: Midline trachea without mass or lesion Thyroid : No mass or nodularity Lymphatics: No lymphadenopathy  Procedure:   Preoperative diagnosis: hoarseness   Postoperative diagnosis:      same + VF atrophy and glottic  insufficiency + GERD LPR + post-nasal drip  Procedure: Flexible fiberoptic laryngoscopy  Surgeon: Artice Last, MD  Anesthesia: Topical lidocaine  and Afrin Complications: None Condition is stable throughout exam  Indications and consent:  The patient presents to the clinic with above symptoms. Indirect laryngoscopy view was incomplete. Thus it was recommended that they undergo a flexible fiberoptic laryngoscopy. All of the risks, benefits, and potential complications were reviewed with the patient preoperatively and verbal informed consent  was obtained.  Procedure: The patient was seated upright in the clinic. Topical lidocaine  and Afrin were applied to the nasal cavity. After adequate anesthesia had occurred, I then proceeded to pass the flexible telescope into the nasal cavity. The nasal cavity was patent without rhinorrhea or polyp. The nasopharynx was also patent without mass or lesion. The base of tongue was visualized and was normal. There were no signs of pooling of secretions in the piriform sinuses. The true vocal folds were mobile bilaterally. There were no signs of glottic or supraglottic mucosal lesion or mass. There was moderate interarytenoid pachydermia and post cricoid edema. The telescope was then slowly withdrawn and the patient tolerated the procedure throughout.   Studies Reviewed: 06/10/23 IMPRESSION: 1. Right upper chest wall, upper breast subcutaneous contusion and hematoma. No sternal fracture, but subtle bilateral anterior rib fractures, 3 through 5. 2. No other acute traumatic injury identified in the noncontrast Chest. 3.  Aortic Atherosclerosis (ICD10-I70.0).  CXR sternum 06/08/23 EXAM: STERNUM - 2+ VIEW   COMPARISON:  Chest radiograph dated 06/07/2023.   FINDINGS: There is no evidence of fracture or other focal bone lesions.   IMPRESSION: Negative.    Assessment/Plan: Encounter Diagnoses  Name Primary?   Dysphonia Yes   Glottic insufficiency     Age-related vocal fold atrophy    Chronic nasal congestion    Environmental and seasonal allergies    Chronic GERD without esophagitis    Nasal obstruction    Nasal septal deviation    Hypertrophy of both inferior nasal turbinates    Dry mouth      Assessment and Plan   Chronic dysphonia Chronic hoarseness for over 6 months with intermittent worsening of sx, exacerbated by talking. Likely multifactorial, and due to vocal fold atrophy seen on strobe exam today and GERD LPR. Examination also revealed post-nasal drainage and dry mouth thick secretions, patient is on restricted fluid intake 2/2 CKD being on dialysis and not making urine any more. Discussed conservative management with voice therapy. - Refer to speech therapist for voice therapy - Continue Protonix  40 mg twice daily for GERD - Provide information on diet and lifestyle modifications for reflux - Recommend Reflux [employer] post-meals - Prescribe Astelin  2 puffs b/l nares and Flonase  2 puffs b/l nares twice daily, spaced half an hour apart for post-nasal drainage - will hold off on antihistamine 2/2 falls and dry mouth  Chronic Nasal Congestion and Postnasal Drip Chronic nasal congestion and postnasal drip, with a history of septoplasty and sinus surgery. Examination showed tight nasal passages with S-shaped septum and clear post-nasal drainage, nasal mucosal edema. Discussed nasal spray use. - Prescribe Astelin  and Flonase  nasal sprays, twice daily, spaced half an hour apart  Chronic Dry Mouth Chronic dry mouth, likely exacerbated by mouth breathing 2/2 nasal congestion and limited fluid intake due to dialysis. Pilocarpine  has been ineffective. Discussed using lemon-flavored lozenges to stimulate saliva production. - Recommend lemon-flavored lozenges to stimulate saliva production  Chronic Kidney Disease on Dialysis Chronic kidney disease requiring dialysis, with associated fluid intake restrictions. Discussed impact on dry  mouth and overall management. 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 No current neurological symptoms reported.  Follow-up - Schedule follow-up appointment in a few months after speech therapy and trial of nasal sprays.      Update 05/04/24 Assessment and Plan Assessment & Plan Dysphonia Vocal cord atrophy Glottic insufficiency Vocal cord atrophy noted on scope exam, otherwise  scope exam unremarkable. Age-related. Speech therapy somewhat helpful. Hyaluronic acid injection augmentation discussed for voice improvement, but at this time she would like to hold off - Consider hyaluronic acid injection augmentation in the future if voice quality worsens. - Continue speech therapy as needed.  Chronic Nasal congestion Post-nasal drainage  Persistent nasal congestion possibly due to environmental allergies. Also on dialysis which can exacerbate nasal congestion. Astelin  causes occasional epistaxis. Saline recommended for dryness and drainage. - Use nasal saline spray to prevent dryness and reduce postnasal drainage. - Consider nasal saline rinses to decrease nasal secretions and postnasal drainage. - will hold off on antihistamine 2/2 dry mouth  Gastroesophageal reflux disease (GERD) Chronic GERD with reflux changes. Long-term Protonix  use with side effect concerns. Headaches with Prilosec. Pepcid recommended for better side effect profile. - Wean off Protonix  slowly  - Start Pepcid if rebound symptoms occur. - Send prescription for Pepcid to pharmacy. 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 Pilocarpine  minimally effective. Xylimelts suggested to stimulate salivary flow. - Try Xylimelts to stimulate salivary flow.     Artice Last,  MD Otolaryngology [facility] Phone: [phone] Fax: [phone]    05/04/2024, 10:04 AM

## 2024-05-04 NOTE — Patient Instructions (Addendum)
 Andres [facility] Med Nasal Saline Rinse   - start nasal saline rinses with [employer] Bottle available over the counter or online to help with nasal congestion      HYALURONIC ACID (RESTYLANE) INJECTION TO THE VOCAL CORD What is Hyaluronic Acid? Hyaluronic Acid is a synthetic substance. It contains a substance that is found in our subcutaneous tissue, or the soft tissue just beneath our skin. It has also been used in plastic surgery to fill in wrinkles or plump lips.    Hyaluronic Acid and the Vocal Cords Vocal cord problems are usually due to a lack of movement, bowing (loss of muscle) or weakness of the vocal cords. This lack of motion makes speech difficult. A lack of vocal cord motion can also cause:  Low, raspy or rough voice  Constant hoarseness  Breathing difficulty  Inability to speak above a whisper  Coughing or choking when swallowing Hyaluronic Acid increases the size or mass of the vocal cord where muscle loss has occurred. It acts like a space filler. Hyaluronic Acid is injected next to the vocal cords and into the muscles supporting the vocal cords. The body slowly reabsorbs it over time, and its effects usually last about 4-6 months, although this range varies for each patient.  Hyaluronic Acid Treatment You will be awake during the treatment and able to talk and breathe normally. Numbing medicines are used before the procedure to decrease the discomfort. The treatment will take about 5-10 minutes.    You should not eat a big meal before the injection.  In the office: You will sit up in a chair for the injection. A fiberoptic camera called a nasoendoscope is placed in a nostril and will go down to the base of your throat. This will allow the doctor to see your vocal cords. An injection of Hyaluronic Acid can be given in several different ways, including via your mouth, directly into the surface of the vocal cord, or through the skin near your "Adam's apple" on your throat. If it is given  through the skin, the lidocaine  is injected into the skin to numb it before the injection.  If it is given via the mouth, the lining of the throat is numbed with liquid lidocaine  first. We often offer a dose of valium to be taken before the procedure if you have someone who can drive you home afterward. Call the office if you would like this prescribed for you, then pick it up at your pharmacy and take it 1 hour before the procedure. Many patients choose not to take the valium, and they are able to drive themselves to and from the appointment.  In the operating room: You will be asleep under general anesthesia, with a breathing tube in. The procedure takes approximately 10 minutes and is done in a hospital or surgery center.  Risks include (but are not limited to) swelling, infection or allergic reaction, pain, extravasation into a more superficial layer of the vocal cord (making the voice more strained), inflammation, or failure to improve the voice. Rarely, additional procedures are needed to address these complications.  The voice is usually strained for a few days, then settles in by 4-7 days. Some patients may sound very good after the injection but find that their voice fades quickly, much earlier than the usual 4-6 months. These patients may benefit from a second injection right away, and they should call the office.  If you have any trouble breathing or severe pain with swallowing or talking,  you should call the office right away. It is not unusual to have a sore throat for a few days afterward, but if this soreness or pain is severe, you should call the office. You should always call the office for any questions or issues.  After a Hyaluronic Acid Treatment  You can talk right after your treatment.  If you had the procedure done in the office, you should not eat or drink for 1 hour, or until the sensation in your throat completely returns to normal  You may have some discomfort or pain that  radiates from your throat to your ear.  You will follow up with your doctor as instructed.  The Hyaluronic Acid procedure results can last from 3-6 months, but this is different for every patient.

## 2024-05-05 DIAGNOSIS — R519 Headache, unspecified: Secondary | ICD-10-CM | POA: Diagnosis not present

## 2024-05-05 DIAGNOSIS — N2581 Secondary hyperparathyroidism of renal origin: Secondary | ICD-10-CM | POA: Diagnosis not present

## 2024-05-05 DIAGNOSIS — D631 Anemia in chronic kidney disease: Secondary | ICD-10-CM | POA: Diagnosis not present

## 2024-05-05 DIAGNOSIS — Z992 Dependence on renal dialysis: Secondary | ICD-10-CM | POA: Diagnosis not present

## 2024-05-05 DIAGNOSIS — G8929 Other chronic pain: Secondary | ICD-10-CM | POA: Diagnosis not present

## 2024-05-05 DIAGNOSIS — N186 End stage renal disease: Secondary | ICD-10-CM | POA: Diagnosis not present

## 2024-05-08 DIAGNOSIS — D631 Anemia in chronic kidney disease: Secondary | ICD-10-CM | POA: Diagnosis not present

## 2024-05-08 DIAGNOSIS — N186 End stage renal disease: Secondary | ICD-10-CM | POA: Diagnosis not present

## 2024-05-08 DIAGNOSIS — R519 Headache, unspecified: Secondary | ICD-10-CM | POA: Diagnosis not present

## 2024-05-08 DIAGNOSIS — N2581 Secondary hyperparathyroidism of renal origin: Secondary | ICD-10-CM | POA: Diagnosis not present

## 2024-05-08 DIAGNOSIS — G8929 Other chronic pain: Secondary | ICD-10-CM | POA: Diagnosis not present

## 2024-05-08 DIAGNOSIS — Z992 Dependence on renal dialysis: Secondary | ICD-10-CM | POA: Diagnosis not present

## 2024-05-10 DIAGNOSIS — D631 Anemia in chronic kidney disease: Secondary | ICD-10-CM | POA: Diagnosis not present

## 2024-05-10 DIAGNOSIS — R519 Headache, unspecified: Secondary | ICD-10-CM | POA: Diagnosis not present

## 2024-05-10 DIAGNOSIS — N2581 Secondary hyperparathyroidism of renal origin: Secondary | ICD-10-CM | POA: Diagnosis not present

## 2024-05-10 DIAGNOSIS — G8929 Other chronic pain: Secondary | ICD-10-CM | POA: Diagnosis not present

## 2024-05-10 DIAGNOSIS — N186 End stage renal disease: Secondary | ICD-10-CM | POA: Diagnosis not present

## 2024-05-10 DIAGNOSIS — Z992 Dependence on renal dialysis: Secondary | ICD-10-CM | POA: Diagnosis not present

## 2024-05-12 DIAGNOSIS — Z992 Dependence on renal dialysis: Secondary | ICD-10-CM | POA: Diagnosis not present

## 2024-05-12 DIAGNOSIS — N2581 Secondary hyperparathyroidism of renal origin: Secondary | ICD-10-CM | POA: Diagnosis not present

## 2024-05-12 DIAGNOSIS — E1022 Type 1 diabetes mellitus with diabetic chronic kidney disease: Secondary | ICD-10-CM | POA: Diagnosis not present

## 2024-05-12 DIAGNOSIS — N186 End stage renal disease: Secondary | ICD-10-CM | POA: Diagnosis not present

## 2024-05-12 DIAGNOSIS — D631 Anemia in chronic kidney disease: Secondary | ICD-10-CM | POA: Diagnosis not present

## 2024-05-12 DIAGNOSIS — R519 Headache, unspecified: Secondary | ICD-10-CM | POA: Diagnosis not present

## 2024-05-12 DIAGNOSIS — G8929 Other chronic pain: Secondary | ICD-10-CM | POA: Diagnosis not present

## 2024-05-15 DIAGNOSIS — N2581 Secondary hyperparathyroidism of renal origin: Secondary | ICD-10-CM | POA: Diagnosis not present

## 2024-05-15 DIAGNOSIS — N186 End stage renal disease: Secondary | ICD-10-CM | POA: Diagnosis not present

## 2024-05-15 DIAGNOSIS — Z992 Dependence on renal dialysis: Secondary | ICD-10-CM | POA: Diagnosis not present

## 2024-05-15 DIAGNOSIS — D631 Anemia in chronic kidney disease: Secondary | ICD-10-CM | POA: Diagnosis not present

## 2024-05-17 ENCOUNTER — Other Ambulatory Visit (HOSPITAL_COMMUNITY): Payer: Self-pay

## 2024-05-17 DIAGNOSIS — Z992 Dependence on renal dialysis: Secondary | ICD-10-CM | POA: Diagnosis not present

## 2024-05-17 DIAGNOSIS — D631 Anemia in chronic kidney disease: Secondary | ICD-10-CM | POA: Diagnosis not present

## 2024-05-17 DIAGNOSIS — N186 End stage renal disease: Secondary | ICD-10-CM | POA: Diagnosis not present

## 2024-05-17 DIAGNOSIS — N2581 Secondary hyperparathyroidism of renal origin: Secondary | ICD-10-CM | POA: Diagnosis not present

## 2024-05-19 DIAGNOSIS — Z992 Dependence on renal dialysis: Secondary | ICD-10-CM | POA: Diagnosis not present

## 2024-05-19 DIAGNOSIS — N186 End stage renal disease: Secondary | ICD-10-CM | POA: Diagnosis not present

## 2024-05-19 DIAGNOSIS — D631 Anemia in chronic kidney disease: Secondary | ICD-10-CM | POA: Diagnosis not present

## 2024-05-19 DIAGNOSIS — N2581 Secondary hyperparathyroidism of renal origin: Secondary | ICD-10-CM | POA: Diagnosis not present

## 2024-05-20 ENCOUNTER — Other Ambulatory Visit: Payer: Self-pay | Admitting: Internal Medicine

## 2024-05-21 DIAGNOSIS — L570 Actinic keratosis: Secondary | ICD-10-CM | POA: Diagnosis not present

## 2024-05-21 DIAGNOSIS — C4442 Squamous cell carcinoma of skin of scalp and neck: Secondary | ICD-10-CM | POA: Diagnosis not present

## 2024-05-21 DIAGNOSIS — D492 Neoplasm of unspecified behavior of bone, soft tissue, and skin: Secondary | ICD-10-CM | POA: Diagnosis not present

## 2024-05-22 DIAGNOSIS — D631 Anemia in chronic kidney disease: Secondary | ICD-10-CM | POA: Diagnosis not present

## 2024-05-22 DIAGNOSIS — N186 End stage renal disease: Secondary | ICD-10-CM | POA: Diagnosis not present

## 2024-05-22 DIAGNOSIS — N2581 Secondary hyperparathyroidism of renal origin: Secondary | ICD-10-CM | POA: Diagnosis not present

## 2024-05-22 DIAGNOSIS — Z992 Dependence on renal dialysis: Secondary | ICD-10-CM | POA: Diagnosis not present

## 2024-05-23 ENCOUNTER — Other Ambulatory Visit: Payer: Self-pay | Admitting: Internal Medicine

## 2024-05-24 DIAGNOSIS — N186 End stage renal disease: Secondary | ICD-10-CM | POA: Diagnosis not present

## 2024-05-24 DIAGNOSIS — N2581 Secondary hyperparathyroidism of renal origin: Secondary | ICD-10-CM | POA: Diagnosis not present

## 2024-05-24 DIAGNOSIS — Z992 Dependence on renal dialysis: Secondary | ICD-10-CM | POA: Diagnosis not present

## 2024-05-24 DIAGNOSIS — D631 Anemia in chronic kidney disease: Secondary | ICD-10-CM | POA: Diagnosis not present

## 2024-05-26 DIAGNOSIS — N186 End stage renal disease: Secondary | ICD-10-CM | POA: Diagnosis not present

## 2024-05-26 DIAGNOSIS — N2581 Secondary hyperparathyroidism of renal origin: Secondary | ICD-10-CM | POA: Diagnosis not present

## 2024-05-26 DIAGNOSIS — D631 Anemia in chronic kidney disease: Secondary | ICD-10-CM | POA: Diagnosis not present

## 2024-05-26 DIAGNOSIS — Z992 Dependence on renal dialysis: Secondary | ICD-10-CM | POA: Diagnosis not present

## 2024-05-27 ENCOUNTER — Ambulatory Visit
Admission: RE | Admit: 2024-05-27 | Discharge: 2024-05-27 | Disposition: A | Discharge status: Home / Self Care | Source: Ambulatory Visit | Attending: Internal Medicine | Admitting: Internal Medicine

## 2024-05-27 DIAGNOSIS — N186 End stage renal disease: Secondary | ICD-10-CM

## 2024-05-27 DIAGNOSIS — E669 Obesity, unspecified: Secondary | ICD-10-CM

## 2024-05-27 DIAGNOSIS — M899 Disorder of bone, unspecified: Secondary | ICD-10-CM

## 2024-05-27 DIAGNOSIS — M4802 Spinal stenosis, cervical region: Secondary | ICD-10-CM | POA: Diagnosis not present

## 2024-05-27 DIAGNOSIS — R296 Repeated falls: Secondary | ICD-10-CM

## 2024-05-27 DIAGNOSIS — M47812 Spondylosis without myelopathy or radiculopathy, cervical region: Secondary | ICD-10-CM | POA: Diagnosis not present

## 2024-05-27 MED ORDER — GADOPICLENOL 0.5 MMOL/ML IV SOLN
9.0000 mL | Freq: Once | INTRAVENOUS | Status: AC | PRN
  Administered 2024-05-27: 9 mL via INTRAVENOUS

## 2024-05-27 MED ORDER — GADOPICLENOL 0.5 MMOL/ML IV SOLN: 9.0000 mL | Freq: Once | INTRAVENOUS | Status: DC | PRN

## 2024-05-29 DIAGNOSIS — D631 Anemia in chronic kidney disease: Secondary | ICD-10-CM | POA: Diagnosis not present

## 2024-05-29 DIAGNOSIS — Z992 Dependence on renal dialysis: Secondary | ICD-10-CM | POA: Diagnosis not present

## 2024-05-29 DIAGNOSIS — N186 End stage renal disease: Secondary | ICD-10-CM | POA: Diagnosis not present

## 2024-05-29 DIAGNOSIS — N2581 Secondary hyperparathyroidism of renal origin: Secondary | ICD-10-CM | POA: Diagnosis not present

## 2024-05-30 ENCOUNTER — Encounter (HOSPITAL_COMMUNITY)
Admission: RE | Disposition: A | Discharge status: Home / Self Care | Payer: Self-pay | Source: Home / Self Care | Attending: Vascular Surgery

## 2024-05-30 ENCOUNTER — Ambulatory Visit (HOSPITAL_COMMUNITY)
Admission: RE | Admit: 2024-05-30 | Discharge: 2024-05-30 | Disposition: A | Discharge status: Home / Self Care | Attending: Vascular Surgery | Admitting: Vascular Surgery

## 2024-05-30 ENCOUNTER — Other Ambulatory Visit: Payer: Self-pay

## 2024-05-30 DIAGNOSIS — T82858A Stenosis of vascular prosthetic devices, implants and grafts, initial encounter: Secondary | ICD-10-CM

## 2024-05-30 DIAGNOSIS — Y832 Surgical operation with anastomosis, bypass or graft as the cause of abnormal reaction of the patient, or of later complication, without mention of misadventure at the time of the procedure: Secondary | ICD-10-CM | POA: Diagnosis not present

## 2024-05-30 DIAGNOSIS — Z87891 Personal history of nicotine dependence: Secondary | ICD-10-CM | POA: Insufficient documentation

## 2024-05-30 DIAGNOSIS — T82838A Hemorrhage of vascular prosthetic devices, implants and grafts, initial encounter: Secondary | ICD-10-CM

## 2024-05-30 DIAGNOSIS — N186 End stage renal disease: Secondary | ICD-10-CM

## 2024-05-30 DIAGNOSIS — Z992 Dependence on renal dialysis: Secondary | ICD-10-CM | POA: Diagnosis not present

## 2024-05-30 HISTORY — PX: VENOUS ANGIOPLASTY: CATH118376

## 2024-05-30 HISTORY — PX: A/V SHUNT INTERVENTION: CATH118220

## 2024-05-30 LAB — GLUCOSE, CAPILLARY: Glucose-Capillary: 153 mg/dL — ABNORMAL HIGH (ref 70–99)

## 2024-05-30 SURGERY — A/V SHUNT INTERVENTION
Anesthesia: LOCAL | Site: Arm Upper | Laterality: Right

## 2024-05-30 MED ORDER — IODIXANOL 320 MG/ML IV SOLN
INTRAVENOUS | Status: DC | PRN
  Administered 2024-05-30: 30 mg via INTRAVENOUS

## 2024-05-30 MED ORDER — LIDOCAINE HCL (PF) 1 % IJ SOLN
INTRAMUSCULAR | Status: DC | PRN
  Administered 2024-05-30: 5 mL

## 2024-05-30 MED ORDER — LIDOCAINE HCL (PF) 1 % IJ SOLN
INTRAMUSCULAR | Status: AC
  Filled 2024-05-30: qty 30

## 2024-05-30 MED ORDER — HEPARIN (PORCINE) IN NACL 1000-0.9 UT/500ML-% IV SOLN
INTRAVENOUS | Status: DC | PRN
  Administered 2024-05-30: 500 mL

## 2024-05-30 SURGICAL SUPPLY — 9 items
BALLOON ATHLETIS 10X40X75 (BALLOONS) IMPLANT
CATH ANGIO 5F BER2 65CM (CATHETERS) IMPLANT
GUIDEWIRE ANGLED .035 180CM (WIRE) IMPLANT
KIT ENCORE 26 ADVANTAGE (KITS) IMPLANT
KIT MICROPUNCTURE NIT STIFF (SHEATH) IMPLANT
SHEATH PINNACLE R/O II 6F 4CM (SHEATH) IMPLANT
SHEATH PROBE COVER 6X72 (BAG) IMPLANT
TRAY PV CATH (CUSTOM PROCEDURE TRAY) ×2 IMPLANT
TUBING CIL FLEX 10 FLL-RA (TUBING) IMPLANT

## 2024-05-30 NOTE — H&P (Signed)
 HD ACCESS CENTER H&P   Patient ID: [name], female   DOB: 11/29/1951, 73 y.o.   MRN: [id number]  Subjective:     HPI [name] is a 73 y.o. female with ESRD presenting to the HD access center for intervention.  Past Medical History:  Diagnosis Date   Anemia    Anxiety    Brain tumor (benign) ([name])    Colitis 2010   microscopic- Dr Elvin Hammer   Depression    Diabetes mellitus    type II   Dyspnea    with exertion   ESRD (end stage renal disease) ([name])    TTUSAT [address]    Fibromyalgia    GERD (gastroesophageal reflux disease)    Headache    History of blood transfusion    after knee surgery   Hypertension    discontinued all diuretics and antihypertensives   IBS (irritable bowel syndrome)    LBP (low back pain)    Neuropathy    feet bilat    Osteoarthritis    Osteopenia    Pneumonia    hx of 2014    Rotator cuff tear, right    Sinusitis    currently being treated with antibiotic will complete 03/04/2015   Family History  Problem Relation Age of Onset   Depression Mother    Hypertension Mother    Stroke Mother 67   Diabetes Mother    Diabetes Father    Hyperlipidemia Father    Hypertension Father    Crohn's disease Maternal Uncle    Cancer Brother        Prostate   Diabetes Brother    Heart disease Brother    Hyperlipidemia Brother    Hypertension Brother    Diabetes Brother    Heart disease Brother        Heart Disease before age 35   Heart attack Brother    Stroke Brother        X's 2   Hyperlipidemia Brother    Hypertension Brother    Diabetes Other    Colon cancer Neg Hx    Esophageal cancer Neg Hx    Stomach cancer Neg Hx    Pancreatic cancer Neg Hx    Breast cancer Neg Hx    Past Surgical History:  Procedure Laterality Date   A/V FISTULAGRAM N/A 02/03/2024   Procedure: A/V Fistulagram;  Surgeon: Baron Border, MD;  Location: [facility] INVASIVE CV LAB;  Service: Cardiovascular;  Laterality: N/A;   AV FISTULA PLACEMENT Right  09/12/2020   Procedure: RIGHT ARTERIOVENOUS (AV) FISTULA CREATION;  Surgeon: Richrd Char, MD;  Location: [facility] OR;  Service: Vascular;  Laterality: Right;   BASCILIC VEIN TRANSPOSITION Right 10/27/2020   Procedure: RIGHT UPPER EXTREMITY SECOND STAGE BASCILIC VEIN TRANSPOSITION;  Surgeon: Richrd Char, MD;  Location: [facility] OR;  Service: Vascular;  Laterality: Right;   BIOPSY  03/11/2022   Procedure: BIOPSY;  Surgeon: Normie Becton., MD;  Location: [facility] ENDOSCOPY;  Service: Gastroenterology;;   COLONOSCOPY WITH PROPOFOL  N/A 03/11/2022   Procedure: COLONOSCOPY WITH PROPOFOL ;  Surgeon: Normie Becton., MD;  Location: [facility] ENDOSCOPY;  Service: Gastroenterology;  Laterality: N/A;   foramen magnum ependymoma surgery  2003   Dr Lawson Prey   JOINT REPLACEMENT Bilateral    NASAL SINUS SURGERY     1973    PERIPHERAL VASCULAR BALLOON ANGIOPLASTY  02/03/2024   Procedure: PERIPHERAL VASCULAR BALLOON ANGIOPLASTY;  Surgeon: Baron Border, MD;  Location: [facility] INVASIVE CV  LAB;  Service: Cardiovascular;;  Inflow Basilic Vein   REVERSE SHOULDER ARTHROPLASTY Right 06/03/2022   Procedure: REVERSE SHOULDER ARTHROPLASTY;  Surgeon: Ellard Gunning, MD;  Location: [facility] ORS;  Service: Orthopedics;  Laterality: Right;    TONSILLECTOMY     TOTAL KNEE ARTHROPLASTY     L 2008, R 2009, R 2016- Dr Rossie Coon   TOTAL KNEE REVISION Right 03/05/2015   Procedure: RIGHT TOTAL KNEE ARTHROPLASTY REVISION;  Surgeon: Liliane Rei, MD;  Location: [facility] ORS;  Service: Orthopedics;  Laterality: Right;    Short Social History:  Social History   Tobacco Use   Smoking status: Former    Current packs/day: 0.00    Average packs/day: 1 pack/day for 20.0 years (20.0 ttl pk-yrs)    Types: Cigarettes    Start date: 12/13/1968    Quit date: 12/13/1988    Years since quitting: 35.4   Smokeless tobacco: Never  Substance Use Topics   Alcohol  use: Not Currently    Alcohol /week: 0.0 standard drinks of alcohol     Comment: rarely     Allergies  Allergen Reactions   Erythromycin Diarrhea and Nausea And Vomiting   Gabapentin Other (See Comments)    Confusion and falling  Hallucinations    No current facility-administered medications for this encounter.    REVIEW OF SYSTEMS All other systems were reviewed and are negative     Objective:   Objective   Vitals:   05/30/24 0937  BP: (!) 98/41  Pulse: 88  Resp: 12  SpO2: 97%   There is no height or weight on file to calculate BMI.  Physical Exam General: no acute distress Cardiac: hemodynamically stable Extremities: Palpable thrill in proximal portion of right upper arm fistula, weaker up near the shoulder  Data: Fistulogram from Dr. Higinio Love on 02/03/2024 was reviewed     Assessment/Plan:   [name] is a 73 y.o. female with ESRD presenting for fistulogram.  Having issues with prolonged bleeding. Last HD session yesterday. Reviewed risks and benefits of fistulogram with intervention and patient agreed to proceed.   Delaney Fearing, MD [facility]

## 2024-05-30 NOTE — Op Note (Signed)
    Patient name: [name] MRN: [id number] DOB: [birth year] Sex: female  05/30/2024 Pre-operative Diagnosis: ESRD on HD, prolonged bleeding after sessions Post-operative diagnosis:  Same Surgeon:  Philipp Brawn, MD Procedure Performed:  Ultrasound-guided access of left arm aVF Fistulogram and central venogram Selective venogram of left innominate vein Balloon angioplasty of left innominate vein, 10 mm x 40 mm Athletis  Indications: [name] is a 73 year old female with ESRD on HD who presents to the HD access center today for fistulogram.  She has been having prolonged bleeding after HD sessions.  Her most recent dialysis session was yesterday.  Risk and benefits of scram with intervention were reviewed, she expressed understanding and elected to proceed.  Findings:  80% stenosis of the left innominate vein, widely patent brachial and axillary veins, widely patent AV fistula.    Procedure:  The patient was identified in the holding area and taken to the cath lab  The patient was then placed supine on the table and prepped and draped in the usual sterile fashion.  A time out was called.  Ultrasound was used to evaluate the left arm AV access. This was accessed under u/s guidance. An 018 wire was advanced without resistance, a micropuncture sheath was placed and fistulagram obtained which demonstrated the above findings.  This access was then upsized to a 57F short sheath over a glidewire.  The Glidewire was placed into the innominate vein and a barrier to catheter was tracked over this Glidewire to this position.  A selective venogram of the innominate vein was obtained which demonstrated the above findings.  The lesion was then crossed with a Glidewire and treated with a 10 mm x 40 mm Athletis balloon.  A completion angiogram demonstrated excellent result with an approximate 20% residual stenosis.  The wire and sheath were removed and the access was managed with a 4-0 Monocryl figure-of-eight  suture for hemostasis.  Contrast: 30 cc   Impression: Severe left innominate vein stenosis, treated with a 10 mm x 40 mm Athletis balloon.   Philipp Brawn MD [facility] [place] Office: [phone]

## 2024-05-31 ENCOUNTER — Encounter (HOSPITAL_COMMUNITY): Payer: Self-pay | Admitting: Vascular Surgery

## 2024-05-31 DIAGNOSIS — D631 Anemia in chronic kidney disease: Secondary | ICD-10-CM | POA: Diagnosis not present

## 2024-05-31 DIAGNOSIS — N2581 Secondary hyperparathyroidism of renal origin: Secondary | ICD-10-CM | POA: Diagnosis not present

## 2024-05-31 DIAGNOSIS — Z992 Dependence on renal dialysis: Secondary | ICD-10-CM | POA: Diagnosis not present

## 2024-05-31 DIAGNOSIS — N186 End stage renal disease: Secondary | ICD-10-CM | POA: Diagnosis not present

## 2024-06-02 DIAGNOSIS — Z992 Dependence on renal dialysis: Secondary | ICD-10-CM | POA: Diagnosis not present

## 2024-06-02 DIAGNOSIS — N2581 Secondary hyperparathyroidism of renal origin: Secondary | ICD-10-CM | POA: Diagnosis not present

## 2024-06-02 DIAGNOSIS — N186 End stage renal disease: Secondary | ICD-10-CM | POA: Diagnosis not present

## 2024-06-02 DIAGNOSIS — D631 Anemia in chronic kidney disease: Secondary | ICD-10-CM | POA: Diagnosis not present

## 2024-06-05 ENCOUNTER — Ambulatory Visit: Admitting: Internal Medicine

## 2024-06-05 DIAGNOSIS — Z992 Dependence on renal dialysis: Secondary | ICD-10-CM | POA: Diagnosis not present

## 2024-06-05 DIAGNOSIS — D631 Anemia in chronic kidney disease: Secondary | ICD-10-CM | POA: Diagnosis not present

## 2024-06-05 DIAGNOSIS — N186 End stage renal disease: Secondary | ICD-10-CM | POA: Diagnosis not present

## 2024-06-05 DIAGNOSIS — N2581 Secondary hyperparathyroidism of renal origin: Secondary | ICD-10-CM | POA: Diagnosis not present

## 2024-06-07 DIAGNOSIS — Z992 Dependence on renal dialysis: Secondary | ICD-10-CM | POA: Diagnosis not present

## 2024-06-07 DIAGNOSIS — N186 End stage renal disease: Secondary | ICD-10-CM | POA: Diagnosis not present

## 2024-06-07 DIAGNOSIS — D631 Anemia in chronic kidney disease: Secondary | ICD-10-CM | POA: Diagnosis not present

## 2024-06-07 DIAGNOSIS — N2581 Secondary hyperparathyroidism of renal origin: Secondary | ICD-10-CM | POA: Diagnosis not present

## 2024-06-09 DIAGNOSIS — Z992 Dependence on renal dialysis: Secondary | ICD-10-CM | POA: Diagnosis not present

## 2024-06-09 DIAGNOSIS — N186 End stage renal disease: Secondary | ICD-10-CM | POA: Diagnosis not present

## 2024-06-09 DIAGNOSIS — N2581 Secondary hyperparathyroidism of renal origin: Secondary | ICD-10-CM | POA: Diagnosis not present

## 2024-06-09 DIAGNOSIS — D631 Anemia in chronic kidney disease: Secondary | ICD-10-CM | POA: Diagnosis not present

## 2024-06-11 ENCOUNTER — Ambulatory Visit: Payer: Self-pay | Admitting: Internal Medicine

## 2024-06-11 DIAGNOSIS — N186 End stage renal disease: Secondary | ICD-10-CM | POA: Diagnosis not present

## 2024-06-11 DIAGNOSIS — Z992 Dependence on renal dialysis: Secondary | ICD-10-CM | POA: Diagnosis not present

## 2024-06-11 DIAGNOSIS — E1022 Type 1 diabetes mellitus with diabetic chronic kidney disease: Secondary | ICD-10-CM | POA: Diagnosis not present

## 2024-06-12 DIAGNOSIS — D631 Anemia in chronic kidney disease: Secondary | ICD-10-CM | POA: Diagnosis not present

## 2024-06-12 DIAGNOSIS — N186 End stage renal disease: Secondary | ICD-10-CM | POA: Diagnosis not present

## 2024-06-12 DIAGNOSIS — Z992 Dependence on renal dialysis: Secondary | ICD-10-CM | POA: Diagnosis not present

## 2024-06-12 DIAGNOSIS — N2581 Secondary hyperparathyroidism of renal origin: Secondary | ICD-10-CM | POA: Diagnosis not present

## 2024-06-13 ENCOUNTER — Telehealth: Payer: Self-pay | Admitting: Internal Medicine

## 2024-06-13 ENCOUNTER — Other Ambulatory Visit (HOSPITAL_COMMUNITY): Payer: Self-pay

## 2024-06-13 ENCOUNTER — Ambulatory Visit (INDEPENDENT_AMBULATORY_CARE_PROVIDER_SITE_OTHER): Admitting: Internal Medicine

## 2024-06-13 ENCOUNTER — Encounter: Payer: Self-pay | Admitting: Internal Medicine

## 2024-06-13 VITALS — BP 110/60 | HR 61 | Temp 98.0°F | Ht 62.5 in | Wt 191.0 lb

## 2024-06-13 DIAGNOSIS — G8929 Other chronic pain: Secondary | ICD-10-CM

## 2024-06-13 DIAGNOSIS — R296 Repeated falls: Secondary | ICD-10-CM

## 2024-06-13 DIAGNOSIS — I10 Essential (primary) hypertension: Secondary | ICD-10-CM | POA: Diagnosis not present

## 2024-06-13 DIAGNOSIS — Z7985 Long-term (current) use of injectable non-insulin antidiabetic drugs: Secondary | ICD-10-CM | POA: Diagnosis not present

## 2024-06-13 DIAGNOSIS — H00012 Hordeolum externum right lower eyelid: Secondary | ICD-10-CM | POA: Diagnosis not present

## 2024-06-13 DIAGNOSIS — N186 End stage renal disease: Secondary | ICD-10-CM | POA: Diagnosis not present

## 2024-06-13 DIAGNOSIS — E785 Hyperlipidemia, unspecified: Secondary | ICD-10-CM

## 2024-06-13 DIAGNOSIS — E1129 Type 2 diabetes mellitus with other diabetic kidney complication: Secondary | ICD-10-CM | POA: Diagnosis not present

## 2024-06-13 DIAGNOSIS — Z780 Asymptomatic menopausal state: Secondary | ICD-10-CM

## 2024-06-13 DIAGNOSIS — M545 Low back pain, unspecified: Secondary | ICD-10-CM | POA: Diagnosis not present

## 2024-06-13 DIAGNOSIS — Z992 Dependence on renal dialysis: Secondary | ICD-10-CM

## 2024-06-13 DIAGNOSIS — H00019 Hordeolum externum unspecified eye, unspecified eyelid: Secondary | ICD-10-CM | POA: Insufficient documentation

## 2024-06-13 LAB — POCT GLYCOSYLATED HEMOGLOBIN (HGB A1C): Hemoglobin A1C: 6.3 % — AB (ref 4.0–5.6)

## 2024-06-13 MED ORDER — ERYTHROMYCIN 5 MG/GM OP OINT
1.0000 | TOPICAL_OINTMENT | Freq: Four times a day (QID) | OPHTHALMIC | 0 refills | Status: DC
  Filled 2024-06-13: qty 3.5, 7d supply, fill #0

## 2024-06-13 MED ORDER — DOXYCYCLINE HYCLATE 100 MG PO TABS
100.0000 mg | ORAL_TABLET | Freq: Two times a day (BID) | ORAL | 0 refills | Status: DC
  Filled 2024-06-13: qty 10, 5d supply, fill #0

## 2024-06-13 NOTE — Assessment & Plan Note (Signed)
 POC A1c done 9.3%A1c per Nephrology On Ozempic  0.5 mg/wk

## 2024-06-13 NOTE — Assessment & Plan Note (Signed)
Low BP - on Midodrin on HD days

## 2024-06-13 NOTE — Assessment & Plan Note (Signed)
F/u w/Dr Nelva Bush

## 2024-06-13 NOTE — Progress Notes (Signed)
 Subjective:  Patient ID: [name], female    DOB: December 12, 1951  Age: 73 y.o. MRN: [id number]  CC: Medical Management of Chronic Issues (3 MNTH F/U, Pt is needing A1c checked as they do  not do that lab at dialysis and pt has a bump on rt lower eye lid that is causing concern)   HPI [name] presents for (3 MNTH F/U, Pt is needing A1c checked as they do  not do that lab at dialysis and pt has a bump on rt lower eye lid that is causing concern) F/u on falls, abn neck MRI  Seeing Dr Ivin - SCC R neck Moh's pending  Outpatient Medications Prior to Visit  Medication Sig Dispense Refill   Accu-Chek Softclix Lancets lancets Use 1 lancet up to 4 times daily as directed 100 each 0   acetaminophen  (TYLENOL ) 500 MG tablet Take 1,000 mg by mouth 2 (two) times daily as needed for headache.     aspirin  EC 81 MG tablet Take 1 tablet (81 mg total) by mouth daily.     aspirin -acetaminophen -caffeine (EXCEDRIN MIGRAINE) 250-250-65 MG tablet Take 1 tablet by mouth daily as needed for headache or migraine. Max 3 doses in one week     azelastine  (ASTELIN ) 0.1 % nasal spray Place 2 sprays into both nostrils 2 (two) times daily. Use in each nostril as directed 30 mL 12   B Complex-C-Folic Acid  (RENA-VITE PO) Take 1 tablet by mouth every morning.     blood glucose meter kit and supplies Dispense based on patient and insurance preference. Used to check blood sugar daily, DX: E11.9 OneTouch 1 each 0   Blood Glucose Monitoring Suppl (ACCU-CHEK GUIDE ME) w/Device KIT Use as directed to check blood sugar four times daily. 1 kit 0   buPROPion  (WELLBUTRIN  XL) 300 MG 24 hr tablet Take 1 tablet (300 mg total) by mouth in the morning. 90 tablet 1   carboxymethylcellulose (REFRESH PLUS) 0.5 % SOLN Place 1 drop into both eyes 3 (three) times daily as needed (dry eyes/irritation).     cholecalciferol (VITAMIN D3) 25 MCG (1000 UNIT) tablet Take by mouth daily.     cinacalcet (SENSIPAR) 30 MG tablet Take 30 mg by  mouth Every Tuesday,Thursday,and Saturday with dialysis.     cyclobenzaprine  (FLEXERIL ) 10 MG tablet Take 1 tablet (10 mg total) by mouth 3 (three) times daily as needed for muscle spasms. 270 tablet 1   diphenhydramine -acetaminophen  (TYLENOL  PM) 25-500 MG TABS tablet Take 2 tablets by mouth at bedtime as needed (sleep).     docusate sodium  (COLACE) 50 MG capsule Take 50 mg by mouth 2 (two) times daily.     Evolocumab  (REPATHA  SURECLICK) 140 MG/ML SOAJ INJECT 1 PEN SUBCUTANEOUSLY  EVERY 2 WEEKS 2 mL 5   famotidine  (PEPCID ) 20 MG tablet Take 1 tablet (20 mg total) by mouth 2 (two) times daily. 30 tablet 3   FLUoxetine  (PROZAC ) 40 MG capsule Take 1 capsule (40 mg total) by mouth in the morning. 90 capsule 1   fluticasone  (FLONASE ) 50 MCG/ACT nasal spray Place 2 sprays into both nostrils daily. 16 g 6   glimepiride  (AMARYL ) 1 MG tablet TAKE 1 TABLET BY MOUTH DAILY  WITH BREAKFAST 100 tablet 2   glucose blood (ACCU-CHEK GUIDE) test strip Use to check blood sugar up to four times daily. 50 each 0   ketoconazole  (NIZORAL ) 2 % cream Apply 1 Application topically daily. (Patient taking differently: Apply 1 Application topically daily as needed  for irritation.) 60 g 3   lanthanum (FOSRENOL) 1000 MG chewable tablet Chew 1,000-2,000 mg by mouth See admin instructions. Taking 2000 mg with meals and 1000 mg  with snacks     lidocaine  (LIDODERM ) 5 % PLACE 2 PATCHES ONTO THE SKIN  DAILY (Patient taking differently: Place 2 patches onto the skin daily as needed (pain).) 120 patch 1   lidocaine  (XYLOCAINE ) 5 % ointment Apply 1 Application topically 3 (three) times daily as needed for moderate pain. 90 g 0   LORazepam  (ATIVAN ) 2 MG tablet Take 1 tablet (2 mg total) by mouth 2 (two) times daily as needed for anxiety. 60 tablet 5   LORazepam  (ATIVAN ) 2 MG tablet Take 1 tablet (2 mg total) by mouth 2 (two) times daily as needed for anxiety. 60 tablet 5   Methoxy PEG-Epoetin  Beta (MIRCERA IJ) Mircera     midodrine   (PROAMATINE ) 10 MG tablet Take 3 tablets (30 mg total) by mouth 3 (three) times a week. Take 30 minutes before dialysis and extra tablet during dialysis. (Patient taking differently: Take 20 mg by mouth 3 (three) times a week. Take 30 minutes before dialysis and extra tablet during dialysis.) 279 tablet 3   multivitamin-lutein (OCUVITE-LUTEIN) CAPS capsule Take 1 capsule by mouth every morning.     naloxone  (NARCAN ) nasal spray 4 mg/0.1 mL Take by nasal route every 3 minutes until patient awakes or EMS arrives. 2 each 0   ondansetron  (ZOFRAN ) 4 MG tablet Take 1 tablet (4 mg total) by mouth every 8 (eight) hours as needed for nausea or vomiting. 10 tablet 0   oxyCODONE  (ROXICODONE ) 15 MG immediate release tablet Take 1 tablet (15 mg total) by mouth 4 (four) times daily as needed. 120 tablet 0   pantoprazole  (PROTONIX ) 40 MG tablet TAKE 1 TABLET BY MOUTH TWICE  DAILY 200 tablet 2   pilocarpine  (SALAGEN ) 5 MG tablet TAKE 1 TABLET BY MOUTH TWICE  DAILY 60 tablet 11   pioglitazone  (ACTOS ) 15 MG tablet TAKE 1 TABLET BY MOUTH DAILY 100 tablet 2   polyethylene glycol (MIRALAX  / GLYCOLAX ) 17 g packet Take 17 g by mouth daily as needed for moderate constipation.     PREVIDENT 5000 DRY MOUTH 1.1 % GEL dental gel Place 1 application  onto teeth in the morning and at bedtime.     Semaglutide ,0.25 or 0.5MG /DOS, (OZEMPIC , 0.25 OR 0.5 MG/DOSE,) 2 MG/3ML SOPN INJECT SUBCUTANEOUSLY 0.5 MG  EVERY WEEK 9 mL 1   sodium chloride  (OCEAN) 0.65 % SOLN nasal spray Place 1 spray into both nostrils as needed. 30 mL 5   No facility-administered medications prior to visit.    ROS: Review of Systems  Constitutional:  Positive for fatigue. Negative for activity change, appetite change, chills and unexpected weight change.  HENT:  Negative for congestion, mouth sores and sinus pressure.   Eyes:  Negative for visual disturbance.  Respiratory:  Negative for cough and chest tightness.   Gastrointestinal:  Negative for abdominal  pain and nausea.  Genitourinary:  Negative for difficulty urinating, frequency and vaginal pain.  Musculoskeletal:  Positive for arthralgias, back pain, gait problem, neck pain and neck stiffness.  Skin:  Positive for color change. Negative for pallor and rash.  Neurological:  Negative for dizziness, tremors, weakness, numbness and headaches.  Psychiatric/Behavioral:  Negative for confusion, sleep disturbance and suicidal ideas. The patient is nervous/anxious.     Objective:  BP 110/60   Pulse 61   Temp 98 F (36.7 C) (Oral)  Ht 5' 2.5 (1.588 m)   Wt 191 lb (86.6 kg)   SpO2 95%   BMI 34.38 kg/m   BP Readings from Last 3 Encounters:  06/13/24 110/60  05/30/24 (!) 107/44  05/04/24 (!) 147/70    Wt Readings from Last 3 Encounters:  06/13/24 191 lb (86.6 kg)  03/28/24 193 lb (87.5 kg)  03/05/24 195 lb (88.5 kg)    Physical Exam Constitutional:      General: She is not in acute distress.    Appearance: She is well-developed. She is obese.  HENT:     Head: Normocephalic.     Right Ear: External ear normal.     Left Ear: External ear normal.     Nose: Nose normal.  Eyes:     General:        Right eye: No discharge.        Left eye: No discharge.     Conjunctiva/sclera: Conjunctivae normal.     Pupils: Pupils are equal, round, and reactive to light.  Neck:     Thyroid : No thyromegaly.     Vascular: No JVD.     Trachea: No tracheal deviation.  Cardiovascular:     Rate and Rhythm: Normal rate and regular rhythm.     Heart sounds: Normal heart sounds.  Pulmonary:     Effort: No respiratory distress.     Breath sounds: No stridor. No wheezing.  Abdominal:     General: Bowel sounds are normal. There is no distension.     Palpations: Abdomen is soft. There is no mass.     Tenderness: There is no abdominal tenderness. There is no guarding or rebound.  Musculoskeletal:        General: No tenderness.     Cervical back: Normal range of motion and neck supple. No  rigidity.  Lymphadenopathy:     Cervical: No cervical adenopathy.  Skin:    Findings: No erythema or rash.  Neurological:     Mental Status: She is oriented to person, place, and time.     Cranial Nerves: No cranial nerve deficit.     Motor: No abnormal muscle tone.     Coordination: Coordination abnormal.     Gait: Gait abnormal.     Deep Tendon Reflexes: Reflexes normal.  Psychiatric:        Behavior: Behavior normal.        Thought Content: Thought content normal.        Judgment: Judgment normal.    Stye - R lower eyelid  Using a cane/pole  Antalgic gait   Lab Results  Component Value Date   WBC 12.3 (H) 03/09/2023   HGB 11.2 (L) 03/09/2023   HCT 34.3 (L) 03/09/2023   PLT 271.0 03/09/2023   GLUCOSE 148 (H) 08/22/2023   CHOL 129 08/22/2023   TRIG 207.0 (H) 08/22/2023   HDL 50.60 08/22/2023   LDLCALC 37 08/22/2023   ALT 13 08/22/2023   AST 16 08/22/2023   NA 136 08/22/2023   K 5.3 (H) 08/22/2023   CL 90 (L) 08/22/2023   CREATININE 5.83 (HH) 08/22/2023   BUN 49 (H) 08/22/2023   CO2 33 (H) 08/22/2023   TSH 3.41 08/22/2023   INR 0.9 03/09/2023   HGBA1C 6.3 (A) 06/13/2024    PERIPHERAL VASCULAR CATHETERIZATION Result Date: 05/30/2024 Images from the original result were not included. Patient name: [name] MRN: [id number] DOB: June 29, 1951 Sex: female 05/30/2024 Pre-operative Diagnosis: ESRD on HD, prolonged bleeding after sessions Post-operative  diagnosis:  Same Surgeon:  Norman GORMAN Serve, MD Procedure Performed: Ultrasound-guided access of left arm aVF Fistulogram and central venogram Selective venogram of left innominate vein Balloon angioplasty of left innominate vein, 10 mm x 40 mm Athletis Indications: Ms. Iacovelli is a 73 year old female with ESRD on HD who presents to the HD access center today for fistulogram.  She has been having prolonged bleeding after HD sessions.  Her most recent dialysis session was yesterday.  Risk and benefits of scram with intervention  were reviewed, she expressed understanding and elected to proceed. Findings: 80% stenosis of the left innominate vein, widely patent brachial and axillary veins, widely patent AV fistula.  Procedure:  The patient was identified in the holding area and taken to the cath lab  The patient was then placed supine on the table and prepped and draped in the usual sterile fashion.  A time out was called.  Ultrasound was used to evaluate the left arm AV access. This was accessed under u/s guidance. An 018 wire was advanced without resistance, a micropuncture sheath was placed and fistulagram obtained which demonstrated the above findings.  This access was then upsized to a 14F short sheath over a glidewire.  The Glidewire was placed into the innominate vein and a barrier to catheter was tracked over this Glidewire to this position.  A selective venogram of the innominate vein was obtained which demonstrated the above findings.  The lesion was then crossed with a Glidewire and treated with a 10 mm x 40 mm Athletis balloon.  A completion angiogram demonstrated excellent result with an approximate 20% residual stenosis.  The wire and sheath were removed and the access was managed with a 4-0 Monocryl figure-of-eight suture for hemostasis. Contrast: 30 cc Impression: Severe left innominate vein stenosis, treated with a 10 mm x 40 mm Athletis balloon. Norman GORMAN Serve MD [facility] [place] Office: [phone]   Assessment & Plan:   Problem List Items Addressed This Visit     Type 2 diabetes mellitus with other diabetic kidney complication ([name])   POC A1c done 9.3%A1c per Nephrology On Ozempic  0.5 mg/wk      Relevant Orders   POCT glycosylated hemoglobin (Hb A1C) (Completed)   Essential hypertension   Low BP - on Midodrin on HD days      LOW BACK PAIN   F/u w/Dr Bonner      ESRD on hemodialysis [facility])   On HD      Falls frequently          Stye external - Primary   Stye - R  lower eyelid Erythro oint Doxy if worse      Dyslipidemia   On Repatha  Statin intolerant         Meds ordered this encounter  Medications   doxycycline (VIBRA-TABS) 100 MG tablet    Sig: Take 1 tablet (100 mg total) by mouth 2 (two) times daily.    Dispense:  10 tablet    Refill:  0   erythromycin ophthalmic ointment    Sig: Apply to stye 4 times daily    Dispense:  3.5 g    Refill:  0      Follow-up: Return in about 3 months (around 09/13/2024) for a follow-up visit.  Marolyn Noel, MD

## 2024-06-13 NOTE — Assessment & Plan Note (Signed)
 On Repatha  Statin intolerant

## 2024-06-13 NOTE — Assessment & Plan Note (Signed)
 Stye - R lower eyelid Erythro oint Doxy if worse

## 2024-06-13 NOTE — Assessment & Plan Note (Signed)
 On HD

## 2024-06-13 NOTE — Telephone Encounter (Signed)
 Patient would like to get her DEXA scan ordered for the [facility] office. She would like a call back at [phone] when it is ordered.

## 2024-06-13 NOTE — Assessment & Plan Note (Signed)
 SABRA

## 2024-06-14 DIAGNOSIS — Z992 Dependence on renal dialysis: Secondary | ICD-10-CM | POA: Diagnosis not present

## 2024-06-14 DIAGNOSIS — N186 End stage renal disease: Secondary | ICD-10-CM | POA: Diagnosis not present

## 2024-06-14 DIAGNOSIS — N2581 Secondary hyperparathyroidism of renal origin: Secondary | ICD-10-CM | POA: Diagnosis not present

## 2024-06-14 NOTE — Telephone Encounter (Signed)
 Okay.  Done.  Thanks

## 2024-06-14 NOTE — Addendum Note (Signed)
 Addended by: Kipper Buch V on: 06/14/2024 12:51 PM   Modules accepted: Orders

## 2024-06-16 DIAGNOSIS — N186 End stage renal disease: Secondary | ICD-10-CM | POA: Diagnosis not present

## 2024-06-16 DIAGNOSIS — Z992 Dependence on renal dialysis: Secondary | ICD-10-CM | POA: Diagnosis not present

## 2024-06-16 DIAGNOSIS — N2581 Secondary hyperparathyroidism of renal origin: Secondary | ICD-10-CM | POA: Diagnosis not present

## 2024-06-19 DIAGNOSIS — Z992 Dependence on renal dialysis: Secondary | ICD-10-CM | POA: Diagnosis not present

## 2024-06-19 DIAGNOSIS — N186 End stage renal disease: Secondary | ICD-10-CM | POA: Diagnosis not present

## 2024-06-19 DIAGNOSIS — N2581 Secondary hyperparathyroidism of renal origin: Secondary | ICD-10-CM | POA: Diagnosis not present

## 2024-06-21 ENCOUNTER — Telehealth: Payer: Self-pay | Admitting: Neurology

## 2024-06-21 DIAGNOSIS — N2581 Secondary hyperparathyroidism of renal origin: Secondary | ICD-10-CM | POA: Diagnosis not present

## 2024-06-21 DIAGNOSIS — D631 Anemia in chronic kidney disease: Secondary | ICD-10-CM | POA: Diagnosis not present

## 2024-06-21 DIAGNOSIS — N186 End stage renal disease: Secondary | ICD-10-CM | POA: Diagnosis not present

## 2024-06-21 DIAGNOSIS — Z992 Dependence on renal dialysis: Secondary | ICD-10-CM | POA: Diagnosis not present

## 2024-06-21 NOTE — Telephone Encounter (Signed)
 Pt calling about opinion on results of Neck xray taken by PCP

## 2024-06-22 DIAGNOSIS — M1811 Unilateral primary osteoarthritis of first carpometacarpal joint, right hand: Secondary | ICD-10-CM | POA: Insufficient documentation

## 2024-06-22 NOTE — Telephone Encounter (Signed)
 Called and reported the information per Dr.Hill , She understood and reported that all her questions were answered.

## 2024-06-23 DIAGNOSIS — N186 End stage renal disease: Secondary | ICD-10-CM | POA: Diagnosis not present

## 2024-06-23 DIAGNOSIS — D631 Anemia in chronic kidney disease: Secondary | ICD-10-CM | POA: Diagnosis not present

## 2024-06-23 DIAGNOSIS — N2581 Secondary hyperparathyroidism of renal origin: Secondary | ICD-10-CM | POA: Diagnosis not present

## 2024-06-23 DIAGNOSIS — Z992 Dependence on renal dialysis: Secondary | ICD-10-CM | POA: Diagnosis not present

## 2024-06-26 DIAGNOSIS — N186 End stage renal disease: Secondary | ICD-10-CM | POA: Diagnosis not present

## 2024-06-26 DIAGNOSIS — Z992 Dependence on renal dialysis: Secondary | ICD-10-CM | POA: Diagnosis not present

## 2024-06-26 DIAGNOSIS — D631 Anemia in chronic kidney disease: Secondary | ICD-10-CM | POA: Diagnosis not present

## 2024-06-26 DIAGNOSIS — N2581 Secondary hyperparathyroidism of renal origin: Secondary | ICD-10-CM | POA: Diagnosis not present

## 2024-06-28 DIAGNOSIS — N186 End stage renal disease: Secondary | ICD-10-CM | POA: Diagnosis not present

## 2024-06-28 DIAGNOSIS — L905 Scar conditions and fibrosis of skin: Secondary | ICD-10-CM | POA: Diagnosis not present

## 2024-06-28 DIAGNOSIS — N2581 Secondary hyperparathyroidism of renal origin: Secondary | ICD-10-CM | POA: Diagnosis not present

## 2024-06-28 DIAGNOSIS — D631 Anemia in chronic kidney disease: Secondary | ICD-10-CM | POA: Diagnosis not present

## 2024-06-28 DIAGNOSIS — Z992 Dependence on renal dialysis: Secondary | ICD-10-CM | POA: Diagnosis not present

## 2024-06-28 DIAGNOSIS — C4442 Squamous cell carcinoma of skin of scalp and neck: Secondary | ICD-10-CM | POA: Diagnosis not present

## 2024-06-30 DIAGNOSIS — N186 End stage renal disease: Secondary | ICD-10-CM | POA: Diagnosis not present

## 2024-06-30 DIAGNOSIS — Z992 Dependence on renal dialysis: Secondary | ICD-10-CM | POA: Diagnosis not present

## 2024-06-30 DIAGNOSIS — D631 Anemia in chronic kidney disease: Secondary | ICD-10-CM | POA: Diagnosis not present

## 2024-06-30 DIAGNOSIS — N2581 Secondary hyperparathyroidism of renal origin: Secondary | ICD-10-CM | POA: Diagnosis not present

## 2024-07-02 ENCOUNTER — Telehealth: Payer: Self-pay | Admitting: Cardiovascular Disease

## 2024-07-02 DIAGNOSIS — E7800 Pure hypercholesterolemia, unspecified: Secondary | ICD-10-CM

## 2024-07-02 NOTE — Telephone Encounter (Signed)
 Called pt in regards to concern.  Reports had a 80% stricture at dialysis site and had site dilated on 05/30/24.  Pt reports during procedure had left arm pain and shoulder pain.  Has not had any pain since this occurrence.  Wants to know if she should be seen by Cardiology prior to October.  Pt reports did not mention discomfort to provider completing procedure.  Advised pt to contact office that performed procedure to express concern.  Pt reports cardiology needs to f/u since symptoms could be r/t heart.  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  Advised pt per her report discomfort only occurred during procedure and has not occurred again it would be best to f/u with office that did procedure.  Again advised pt to contact Providers office that performed procedure with concern.  Pt reports would like Dr. Francyne to review report from 05/30/24 procedure.  Advised will send to MD to review.

## 2024-07-02 NOTE — Telephone Encounter (Signed)
 The procedure involved inflating a balloon in the left innominate vein, which is located in the left shoulder area, right behind the left clavicle. I suspect this caused the pain. If she is able to walk and perform other physical activity without the left shoulder pain and does not have that pain during the stress test of hemodialysis, it makes it less likely to be heart related. If there is an earlier opening in the schedule, please have her come in sooner, but I do not think it is an urgent issue, since a whole month has passed without recurrence.

## 2024-07-02 NOTE — Telephone Encounter (Signed)
 Pt called in stating during her test on 6/18 she had some pain her shoulder. She has not had the pain since and has not had any other symptoms since then. She asked if needs to be seen sooner than October.

## 2024-07-03 DIAGNOSIS — N2581 Secondary hyperparathyroidism of renal origin: Secondary | ICD-10-CM | POA: Diagnosis not present

## 2024-07-03 DIAGNOSIS — D631 Anemia in chronic kidney disease: Secondary | ICD-10-CM | POA: Diagnosis not present

## 2024-07-03 DIAGNOSIS — Z992 Dependence on renal dialysis: Secondary | ICD-10-CM | POA: Diagnosis not present

## 2024-07-03 DIAGNOSIS — N186 End stage renal disease: Secondary | ICD-10-CM | POA: Diagnosis not present

## 2024-07-05 DIAGNOSIS — N186 End stage renal disease: Secondary | ICD-10-CM | POA: Diagnosis not present

## 2024-07-05 DIAGNOSIS — D631 Anemia in chronic kidney disease: Secondary | ICD-10-CM | POA: Diagnosis not present

## 2024-07-05 DIAGNOSIS — N2581 Secondary hyperparathyroidism of renal origin: Secondary | ICD-10-CM | POA: Diagnosis not present

## 2024-07-05 DIAGNOSIS — Z992 Dependence on renal dialysis: Secondary | ICD-10-CM | POA: Diagnosis not present

## 2024-07-05 NOTE — Telephone Encounter (Signed)
 Went over all information with the patient and she verbalized understanding. She is not having any pain at this time.   She wanted to know if she needs any lab work before her appt in October. She says she is taking Repatha . Informed her that I would check with Dr Francyne to see if she needs any lab work before appt and let her know.

## 2024-07-05 NOTE — Telephone Encounter (Signed)
 Yes, a lipid panel please

## 2024-07-07 DIAGNOSIS — N186 End stage renal disease: Secondary | ICD-10-CM | POA: Diagnosis not present

## 2024-07-07 DIAGNOSIS — Z992 Dependence on renal dialysis: Secondary | ICD-10-CM | POA: Diagnosis not present

## 2024-07-07 DIAGNOSIS — N2581 Secondary hyperparathyroidism of renal origin: Secondary | ICD-10-CM | POA: Diagnosis not present

## 2024-07-07 DIAGNOSIS — D631 Anemia in chronic kidney disease: Secondary | ICD-10-CM | POA: Diagnosis not present

## 2024-07-09 ENCOUNTER — Other Ambulatory Visit: Payer: Self-pay | Admitting: Internal Medicine

## 2024-07-09 ENCOUNTER — Other Ambulatory Visit: Payer: Self-pay

## 2024-07-09 MED ORDER — CYCLOBENZAPRINE HCL 10 MG PO TABS: 10.0000 mg | ORAL_TABLET | Freq: Three times a day (TID) | ORAL | 0 refills | Status: DC | PRN

## 2024-07-09 NOTE — Telephone Encounter (Signed)
 Copied from CRM [id number]. Topic: Clinical - Medication Refill >> Jul 09, 2024 12:02 PM Aisha D wrote: Medication: cyclobenzaprine  (FLEXERIL ) 10 MG tablet  Has the patient contacted their pharmacy? No (Agent: If no, request that the patient contact the pharmacy for the refill. If patient does not wish to contact the pharmacy document the reason why and proceed with request.) (Agent: If yes, when and what did the pharmacy advise?)  This is the patient's preferred pharmacy:   [id number] - [place], [place] - [address] AT [facility] [address] [place] [address] Phone: [phone] Fax: [phone]  Is this the correct pharmacy for this prescription? Yes If no, delete pharmacy and type the correct one.   Has the prescription been filled recently? No  Is the patient out of the medication? No  Has the patient been seen for an appointment in the last year OR does the patient have an upcoming appointment? Yes  Can we respond through MyChart? No  Agent: Please be advised that Rx refills may take up to 3 business days. We ask that you follow-up with your pharmacy.

## 2024-07-10 DIAGNOSIS — N2581 Secondary hyperparathyroidism of renal origin: Secondary | ICD-10-CM | POA: Diagnosis not present

## 2024-07-10 DIAGNOSIS — Z992 Dependence on renal dialysis: Secondary | ICD-10-CM | POA: Diagnosis not present

## 2024-07-10 DIAGNOSIS — N186 End stage renal disease: Secondary | ICD-10-CM | POA: Diagnosis not present

## 2024-07-10 DIAGNOSIS — D631 Anemia in chronic kidney disease: Secondary | ICD-10-CM | POA: Diagnosis not present

## 2024-07-12 DIAGNOSIS — Z992 Dependence on renal dialysis: Secondary | ICD-10-CM | POA: Diagnosis not present

## 2024-07-12 DIAGNOSIS — N2581 Secondary hyperparathyroidism of renal origin: Secondary | ICD-10-CM | POA: Diagnosis not present

## 2024-07-12 DIAGNOSIS — D631 Anemia in chronic kidney disease: Secondary | ICD-10-CM | POA: Diagnosis not present

## 2024-07-12 DIAGNOSIS — E1022 Type 1 diabetes mellitus with diabetic chronic kidney disease: Secondary | ICD-10-CM | POA: Diagnosis not present

## 2024-07-12 DIAGNOSIS — N186 End stage renal disease: Secondary | ICD-10-CM | POA: Diagnosis not present

## 2024-07-12 NOTE — Telephone Encounter (Signed)
 Lipid panel ordered and [facility] message sent to patient

## 2024-07-14 DIAGNOSIS — N2581 Secondary hyperparathyroidism of renal origin: Secondary | ICD-10-CM | POA: Diagnosis not present

## 2024-07-14 DIAGNOSIS — N186 End stage renal disease: Secondary | ICD-10-CM | POA: Diagnosis not present

## 2024-07-14 DIAGNOSIS — Z992 Dependence on renal dialysis: Secondary | ICD-10-CM | POA: Diagnosis not present

## 2024-07-14 DIAGNOSIS — D631 Anemia in chronic kidney disease: Secondary | ICD-10-CM | POA: Diagnosis not present

## 2024-07-17 DIAGNOSIS — G894 Chronic pain syndrome: Secondary | ICD-10-CM | POA: Diagnosis not present

## 2024-07-17 DIAGNOSIS — N2581 Secondary hyperparathyroidism of renal origin: Secondary | ICD-10-CM | POA: Diagnosis not present

## 2024-07-17 DIAGNOSIS — Z5181 Encounter for therapeutic drug level monitoring: Secondary | ICD-10-CM | POA: Diagnosis not present

## 2024-07-17 DIAGNOSIS — D631 Anemia in chronic kidney disease: Secondary | ICD-10-CM | POA: Diagnosis not present

## 2024-07-17 DIAGNOSIS — M542 Cervicalgia: Secondary | ICD-10-CM | POA: Diagnosis not present

## 2024-07-17 DIAGNOSIS — Z992 Dependence on renal dialysis: Secondary | ICD-10-CM | POA: Diagnosis not present

## 2024-07-17 DIAGNOSIS — N186 End stage renal disease: Secondary | ICD-10-CM | POA: Diagnosis not present

## 2024-07-18 ENCOUNTER — Ambulatory Visit
Admission: RE | Admit: 2024-07-18 | Discharge: 2024-07-18 | Disposition: A | Discharge status: Home / Self Care | Source: Ambulatory Visit | Attending: Obstetrics and Gynecology | Admitting: Obstetrics and Gynecology

## 2024-07-18 DIAGNOSIS — Z1231 Encounter for screening mammogram for malignant neoplasm of breast: Secondary | ICD-10-CM

## 2024-07-19 DIAGNOSIS — N2581 Secondary hyperparathyroidism of renal origin: Secondary | ICD-10-CM | POA: Diagnosis not present

## 2024-07-19 DIAGNOSIS — Z992 Dependence on renal dialysis: Secondary | ICD-10-CM | POA: Diagnosis not present

## 2024-07-19 DIAGNOSIS — N186 End stage renal disease: Secondary | ICD-10-CM | POA: Diagnosis not present

## 2024-07-19 DIAGNOSIS — D631 Anemia in chronic kidney disease: Secondary | ICD-10-CM | POA: Diagnosis not present

## 2024-07-21 DIAGNOSIS — N2581 Secondary hyperparathyroidism of renal origin: Secondary | ICD-10-CM | POA: Diagnosis not present

## 2024-07-21 DIAGNOSIS — D631 Anemia in chronic kidney disease: Secondary | ICD-10-CM | POA: Diagnosis not present

## 2024-07-21 DIAGNOSIS — N186 End stage renal disease: Secondary | ICD-10-CM | POA: Diagnosis not present

## 2024-07-21 DIAGNOSIS — Z992 Dependence on renal dialysis: Secondary | ICD-10-CM | POA: Diagnosis not present

## 2024-07-24 DIAGNOSIS — N2581 Secondary hyperparathyroidism of renal origin: Secondary | ICD-10-CM | POA: Diagnosis not present

## 2024-07-24 DIAGNOSIS — D631 Anemia in chronic kidney disease: Secondary | ICD-10-CM | POA: Diagnosis not present

## 2024-07-24 DIAGNOSIS — Z992 Dependence on renal dialysis: Secondary | ICD-10-CM | POA: Diagnosis not present

## 2024-07-24 DIAGNOSIS — N186 End stage renal disease: Secondary | ICD-10-CM | POA: Diagnosis not present

## 2024-07-26 DIAGNOSIS — N2581 Secondary hyperparathyroidism of renal origin: Secondary | ICD-10-CM | POA: Diagnosis not present

## 2024-07-26 DIAGNOSIS — D631 Anemia in chronic kidney disease: Secondary | ICD-10-CM | POA: Diagnosis not present

## 2024-07-26 DIAGNOSIS — N186 End stage renal disease: Secondary | ICD-10-CM | POA: Diagnosis not present

## 2024-07-26 DIAGNOSIS — Z992 Dependence on renal dialysis: Secondary | ICD-10-CM | POA: Diagnosis not present

## 2024-07-28 DIAGNOSIS — D631 Anemia in chronic kidney disease: Secondary | ICD-10-CM | POA: Diagnosis not present

## 2024-07-28 DIAGNOSIS — N2581 Secondary hyperparathyroidism of renal origin: Secondary | ICD-10-CM | POA: Diagnosis not present

## 2024-07-28 DIAGNOSIS — Z992 Dependence on renal dialysis: Secondary | ICD-10-CM | POA: Diagnosis not present

## 2024-07-28 DIAGNOSIS — N186 End stage renal disease: Secondary | ICD-10-CM | POA: Diagnosis not present

## 2024-07-30 DIAGNOSIS — M816 Localized osteoporosis [Lequesne]: Secondary | ICD-10-CM | POA: Diagnosis not present

## 2024-07-31 DIAGNOSIS — N186 End stage renal disease: Secondary | ICD-10-CM | POA: Diagnosis not present

## 2024-07-31 DIAGNOSIS — D631 Anemia in chronic kidney disease: Secondary | ICD-10-CM | POA: Diagnosis not present

## 2024-07-31 DIAGNOSIS — Z992 Dependence on renal dialysis: Secondary | ICD-10-CM | POA: Diagnosis not present

## 2024-07-31 DIAGNOSIS — N2581 Secondary hyperparathyroidism of renal origin: Secondary | ICD-10-CM | POA: Diagnosis not present

## 2024-08-02 DIAGNOSIS — D631 Anemia in chronic kidney disease: Secondary | ICD-10-CM | POA: Diagnosis not present

## 2024-08-02 DIAGNOSIS — N186 End stage renal disease: Secondary | ICD-10-CM | POA: Diagnosis not present

## 2024-08-02 DIAGNOSIS — N2581 Secondary hyperparathyroidism of renal origin: Secondary | ICD-10-CM | POA: Diagnosis not present

## 2024-08-02 DIAGNOSIS — Z992 Dependence on renal dialysis: Secondary | ICD-10-CM | POA: Diagnosis not present

## 2024-08-03 ENCOUNTER — Ambulatory Visit
Admission: RE | Admit: 2024-08-03 | Discharge: 2024-08-03 | Disposition: A | Discharge status: Home / Self Care | Source: Ambulatory Visit | Attending: Obstetrics and Gynecology | Admitting: Obstetrics and Gynecology

## 2024-08-03 ENCOUNTER — Other Ambulatory Visit: Payer: Self-pay | Admitting: Obstetrics and Gynecology

## 2024-08-03 DIAGNOSIS — Z13820 Encounter for screening for osteoporosis: Secondary | ICD-10-CM

## 2024-08-03 DIAGNOSIS — R933 Abnormal findings on diagnostic imaging of other parts of digestive tract: Secondary | ICD-10-CM | POA: Diagnosis not present

## 2024-08-04 DIAGNOSIS — D631 Anemia in chronic kidney disease: Secondary | ICD-10-CM | POA: Diagnosis not present

## 2024-08-04 DIAGNOSIS — Z992 Dependence on renal dialysis: Secondary | ICD-10-CM | POA: Diagnosis not present

## 2024-08-04 DIAGNOSIS — N186 End stage renal disease: Secondary | ICD-10-CM | POA: Diagnosis not present

## 2024-08-04 DIAGNOSIS — N2581 Secondary hyperparathyroidism of renal origin: Secondary | ICD-10-CM | POA: Diagnosis not present

## 2024-08-07 DIAGNOSIS — N2581 Secondary hyperparathyroidism of renal origin: Secondary | ICD-10-CM | POA: Diagnosis not present

## 2024-08-07 DIAGNOSIS — N186 End stage renal disease: Secondary | ICD-10-CM | POA: Diagnosis not present

## 2024-08-07 DIAGNOSIS — Z992 Dependence on renal dialysis: Secondary | ICD-10-CM | POA: Diagnosis not present

## 2024-08-07 DIAGNOSIS — D631 Anemia in chronic kidney disease: Secondary | ICD-10-CM | POA: Diagnosis not present

## 2024-08-09 DIAGNOSIS — N2581 Secondary hyperparathyroidism of renal origin: Secondary | ICD-10-CM | POA: Diagnosis not present

## 2024-08-09 DIAGNOSIS — N186 End stage renal disease: Secondary | ICD-10-CM | POA: Diagnosis not present

## 2024-08-09 DIAGNOSIS — D631 Anemia in chronic kidney disease: Secondary | ICD-10-CM | POA: Diagnosis not present

## 2024-08-09 DIAGNOSIS — Z992 Dependence on renal dialysis: Secondary | ICD-10-CM | POA: Diagnosis not present

## 2024-08-11 DIAGNOSIS — N2581 Secondary hyperparathyroidism of renal origin: Secondary | ICD-10-CM | POA: Diagnosis not present

## 2024-08-11 DIAGNOSIS — Z992 Dependence on renal dialysis: Secondary | ICD-10-CM | POA: Diagnosis not present

## 2024-08-11 DIAGNOSIS — D631 Anemia in chronic kidney disease: Secondary | ICD-10-CM | POA: Diagnosis not present

## 2024-08-11 DIAGNOSIS — N186 End stage renal disease: Secondary | ICD-10-CM | POA: Diagnosis not present

## 2024-08-12 DIAGNOSIS — E1022 Type 1 diabetes mellitus with diabetic chronic kidney disease: Secondary | ICD-10-CM | POA: Diagnosis not present

## 2024-08-12 DIAGNOSIS — N186 End stage renal disease: Secondary | ICD-10-CM | POA: Diagnosis not present

## 2024-08-12 DIAGNOSIS — Z992 Dependence on renal dialysis: Secondary | ICD-10-CM | POA: Diagnosis not present

## 2024-08-14 DIAGNOSIS — Z992 Dependence on renal dialysis: Secondary | ICD-10-CM | POA: Diagnosis not present

## 2024-08-14 DIAGNOSIS — D509 Iron deficiency anemia, unspecified: Secondary | ICD-10-CM | POA: Diagnosis not present

## 2024-08-14 DIAGNOSIS — D631 Anemia in chronic kidney disease: Secondary | ICD-10-CM | POA: Diagnosis not present

## 2024-08-14 DIAGNOSIS — N2581 Secondary hyperparathyroidism of renal origin: Secondary | ICD-10-CM | POA: Diagnosis not present

## 2024-08-14 DIAGNOSIS — N186 End stage renal disease: Secondary | ICD-10-CM | POA: Diagnosis not present

## 2024-08-16 DIAGNOSIS — Z992 Dependence on renal dialysis: Secondary | ICD-10-CM | POA: Diagnosis not present

## 2024-08-16 DIAGNOSIS — D509 Iron deficiency anemia, unspecified: Secondary | ICD-10-CM | POA: Diagnosis not present

## 2024-08-16 DIAGNOSIS — N186 End stage renal disease: Secondary | ICD-10-CM | POA: Diagnosis not present

## 2024-08-16 DIAGNOSIS — N2581 Secondary hyperparathyroidism of renal origin: Secondary | ICD-10-CM | POA: Diagnosis not present

## 2024-08-16 DIAGNOSIS — D631 Anemia in chronic kidney disease: Secondary | ICD-10-CM | POA: Diagnosis not present

## 2024-08-17 ENCOUNTER — Ambulatory Visit

## 2024-08-17 VITALS — Ht 62.5 in | Wt 185.2 lb

## 2024-08-17 DIAGNOSIS — Z0000 Encounter for general adult medical examination without abnormal findings: Secondary | ICD-10-CM | POA: Diagnosis not present

## 2024-08-17 NOTE — Patient Instructions (Addendum)
 [name],  Thank you for taking the time for your Medicare Wellness Visit. I appreciate your continued commitment to your health goals. Please review the care plan we discussed, and feel free to reach out if I can assist you further.  Medicare recommends these wellness visits once per year to help you and your care team stay ahead of potential health issues. These visits are designed to focus on prevention, allowing your provider to concentrate on managing your acute and chronic conditions during your regular appointments.  Please note that Annual Wellness Visits do not include a physical exam. Some assessments may be limited, especially if the visit was conducted virtually. If needed, we may recommend a separate in-person follow-up with your provider.  Ongoing Care Seeing your primary care provider every 3 to 6 months helps us  monitor your health and provide consistent, personalized care.   Referrals If a referral was made during today's visit and you haven't received any updates within two weeks, please contact the referred provider directly to check on the status.  Recommended Screenings:  Health Maintenance  Topic Date Due   Complete foot exam   04/28/2017   Flu Shot  07/13/2024   COVID-19 Vaccine (7 - 2025-26 season) 08/13/2024   Eye exam for diabetics  08/23/2024   Hemoglobin A1C  12/14/2024   Medicare Annual Wellness Visit  08/17/2025   Mammogram  07/18/2026   DTaP/Tdap/Td vaccine (4 - Td or Tdap) 08/20/2026   Colon Cancer Screening  03/11/2029   Pneumococcal Vaccine for age over 64  Completed   DEXA scan (bone density measurement)  Completed   Hepatitis C Screening  Completed   Zoster (Shingles) Vaccine  Completed   HPV Vaccine  Aged Out   Meningitis B Vaccine  Aged Out   Hepatitis B Vaccine  Discontinued       08/17/2024    1:43 PM  Advanced Directives  Does Patient Have a Medical Advance Directive? Yes  Type of Estate agent of [place];Living  will  Does patient want to make changes to medical advance directive? No - Patient declined  Copy of Healthcare Power of Attorney in Chart? Yes - validated most recent copy scanned in chart (See row information)   Advance Care Planning is important because it: Ensures you receive medical care that aligns with your values, goals, and preferences. Provides guidance to your family and loved ones, reducing the emotional burden of decision-making during critical moments.  Vision: Annual vision screenings are recommended for early detection of glaucoma, cataracts, and diabetic retinopathy. These exams can also reveal signs of chronic conditions such as diabetes and high blood pressure.  Dental: Annual dental screenings help detect early signs of oral cancer, gum disease, and other conditions linked to overall health, including heart disease and diabetes.

## 2024-08-17 NOTE — Progress Notes (Signed)
 Subjective:  Please attest and cosign this visit due to patients primary care provider not being in the office at the time the visit was completed.  (Pt of Dr A. Plotnikov)   [name] is a 73 y.o. who presents for a Medicare Wellness preventive visit.  As a reminder, Annual Wellness Visits don't include a physical exam, and some assessments may be limited, especially if this visit is performed virtually. We may recommend an in-person follow-up visit with your provider if needed.  Visit Complete: Virtual I connected with  MACKIE GOON on 08/17/24 by a audio enabled telemedicine application and verified that I am speaking with the correct person using two identifiers.  Patient Location: Home  Provider Location: Office/Clinic  I discussed the limitations of evaluation and management by telemedicine. The patient expressed understanding and agreed to proceed.  Vital Signs: Because this visit was a virtual/telehealth visit, some criteria may be missing or patient reported. Any vitals not documented were not able to be obtained and vitals that have been documented are patient reported.  VideoDeclined- This patient declined Librarian, academic. Therefore the visit was completed with audio only.  Persons Participating in Visit: Patient.  AWV Questionnaire: No: Patient Medicare AWV questionnaire was not completed prior to this visit.  Cardiac Risk Factors include: advanced age (>62men, >34 women);dyslipidemia;diabetes mellitus;hypertension;obesity (BMI >30kg/m2)     Objective:    Today's Vitals   08/17/24 1343  Weight: 185 lb 3.2 oz (84 kg)  Height: 5' 2.5 (1.588 m)   Body mass index is 33.33 kg/m.     08/17/2024    1:43 PM 03/28/2024   12:55 PM 02/06/2024    2:08 PM 12/16/2023   11:04 AM 08/29/2023   10:55 AM 06/07/2023    7:29 PM 08/02/2022    4:08 PM  Advanced Directives  Does Patient Have a Medical Advance Directive? Yes No Yes Yes Yes No Yes   Type of Estate agent of [place];Living will  Healthcare Power of [place];Living will    Living will;Healthcare Power of Attorney  Does patient want to make changes to medical advance directive? No - Patient declined   No - Patient declined No - Patient declined  No - Patient declined  Copy of Healthcare Power of Attorney in Chart? Yes - validated most recent copy scanned in chart (See row information)      No - copy requested  Would patient like information on creating a medical advance directive?      No - Guardian declined     Current Medications (verified) Outpatient Encounter Medications as of 08/17/2024  Medication Sig   Accu-Chek Softclix Lancets lancets Use 1 lancet up to 4 times daily as directed   acetaminophen  (TYLENOL ) 500 MG tablet Take 1,000 mg by mouth 2 (two) times daily as needed for headache.   aspirin  EC 81 MG tablet Take 1 tablet (81 mg total) by mouth daily.   aspirin -acetaminophen -caffeine (EXCEDRIN MIGRAINE) 250-250-65 MG tablet Take 1 tablet by mouth daily as needed for headache or migraine. Max 3 doses in one week   azelastine  (ASTELIN ) 0.1 % nasal spray Place 2 sprays into both nostrils 2 (two) times daily. Use in each nostril as directed   B Complex-C-Folic Acid  (RENA-VITE PO) Take 1 tablet by mouth every morning.   blood glucose meter kit and supplies Dispense based on patient and insurance preference. Used to check blood sugar daily, DX: E11.9 OneTouch   Blood Glucose Monitoring Suppl (ACCU-CHEK  GUIDE ME) w/Device KIT Use as directed to check blood sugar four times daily.   buPROPion  (WELLBUTRIN  XL) 300 MG 24 hr tablet Take 1 tablet (300 mg total) by mouth in the morning.   carboxymethylcellulose (REFRESH PLUS) 0.5 % SOLN Place 1 drop into both eyes 3 (three) times daily as needed (dry eyes/irritation).   cholecalciferol (VITAMIN D3) 25 MCG (1000 UNIT) tablet Take by mouth daily.   cinacalcet  (SENSIPAR ) 30 MG tablet Take 30 mg by mouth Every  Tuesday,Thursday,and Saturday with dialysis.   cyclobenzaprine  (FLEXERIL ) 10 MG tablet Take 1 tablet (10 mg total) by mouth 3 (three) times daily as needed for muscle spasms.   diphenhydramine -acetaminophen  (TYLENOL  PM) 25-500 MG TABS tablet Take 2 tablets by mouth at bedtime as needed (sleep).   docusate sodium  (COLACE) 50 MG capsule Take 50 mg by mouth 2 (two) times daily.   doxycycline  (VIBRA -TABS) 100 MG tablet Take 1 tablet (100 mg total) by mouth 2 (two) times daily.   erythromycin  ophthalmic ointment Apply to stye 4 times daily as directed.   Evolocumab  (REPATHA  SURECLICK) 140 MG/ML SOAJ INJECT 1 PEN SUBCUTANEOUSLY  EVERY 2 WEEKS   famotidine  (PEPCID ) 20 MG tablet Take 1 tablet (20 mg total) by mouth 2 (two) times daily.   FLUoxetine  (PROZAC ) 40 MG capsule Take 1 capsule (40 mg total) by mouth in the morning.   fluticasone  (FLONASE ) 50 MCG/ACT nasal spray Place 2 sprays into both nostrils daily.   glimepiride  (AMARYL ) 1 MG tablet TAKE 1 TABLET BY MOUTH DAILY  WITH BREAKFAST   glucose blood (ACCU-CHEK GUIDE) test strip Use to check blood sugar up to four times daily.   ketoconazole  (NIZORAL ) 2 % cream Apply 1 Application topically daily.   lidocaine  (LIDODERM ) 5 % PLACE 2 PATCHES ONTO THE SKIN  DAILY   lidocaine  (XYLOCAINE ) 5 % ointment Apply 1 Application topically 3 (three) times daily as needed for moderate pain.   LORazepam  (ATIVAN ) 2 MG tablet Take 1 tablet (2 mg total) by mouth 2 (two) times daily as needed for anxiety.   LORazepam  (ATIVAN ) 2 MG tablet Take 1 tablet (2 mg total) by mouth 2 (two) times daily as needed for anxiety.   midodrine  (PROAMATINE ) 10 MG tablet Take 3 tablets (30 mg total) by mouth 3 (three) times a week. Take 30 minutes before dialysis and extra tablet during dialysis. (Patient taking differently: Take 20 mg by mouth 3 (three) times a week. Take 30 minutes before dialysis and extra tablet during dialysis.)   multivitamin-lutein (OCUVITE-LUTEIN) CAPS capsule Take  1 capsule by mouth every morning.   naloxone  (NARCAN ) nasal spray 4 mg/0.1 mL Take by nasal route every 3 minutes until patient awakes or EMS arrives.   ondansetron  (ZOFRAN ) 4 MG tablet Take 1 tablet (4 mg total) by mouth every 8 (eight) hours as needed for nausea or vomiting.   oxyCODONE  (ROXICODONE ) 15 MG immediate release tablet Take 1 tablet (15 mg total) by mouth 4 (four) times daily as needed.   pantoprazole  (PROTONIX ) 40 MG tablet TAKE 1 TABLET BY MOUTH TWICE  DAILY   pilocarpine  (SALAGEN ) 5 MG tablet TAKE 1 TABLET BY MOUTH TWICE  DAILY   pioglitazone  (ACTOS ) 15 MG tablet TAKE 1 TABLET BY MOUTH DAILY   polyethylene glycol (MIRALAX  / GLYCOLAX ) 17 g packet Take 17 g by mouth daily as needed for moderate constipation.   PREVIDENT 5000 DRY MOUTH 1.1 % GEL dental gel Place 1 application  onto teeth in the morning and at bedtime.  Semaglutide ,0.25 or 0.5MG /DOS, (OZEMPIC , 0.25 OR 0.5 MG/DOSE,) 2 MG/3ML SOPN INJECT SUBCUTANEOUSLY 0.5 MG  EVERY WEEK   sodium chloride  (OCEAN) 0.65 % SOLN nasal spray Place 1 spray into both nostrils as needed.   lanthanum (FOSRENOL) 1000 MG chewable tablet Chew 1,000-2,000 mg by mouth See admin instructions. Taking 2000 mg with meals and 1000 mg  with snacks   No facility-administered encounter medications on file as of 08/17/2024.    Allergies (verified) Erythromycin  and Gabapentin   History: Past Medical History:  Diagnosis Date   Anemia    Anxiety    Brain tumor (benign) ([name])    Colitis 2010   microscopic- Dr Abran   Depression    Diabetes mellitus    type II   Dyspnea    with exertion   ESRD (end stage renal disease) ([name])    TTUSAT [address]    Fibromyalgia    GERD (gastroesophageal reflux disease)    Headache    History of blood transfusion    after knee surgery   Hypertension    discontinued all diuretics and antihypertensives   IBS (irritable bowel syndrome)    LBP (low back pain)    Neuropathy    feet bilat    Osteoarthritis     Osteopenia    Pneumonia    hx of 2014    Rotator cuff tear, right    Sinusitis    currently being treated with antibiotic will complete 03/04/2015   Past Surgical History:  Procedure Laterality Date   A/V FISTULAGRAM N/A 02/03/2024   Procedure: A/V Fistulagram;  Surgeon: Norine Manuelita LABOR, MD;  Location: [facility] INVASIVE CV LAB;  Service: Cardiovascular;  Laterality: N/A;   A/V SHUNT INTERVENTION Right 05/30/2024   Procedure: A/V SHUNT INTERVENTION;  Surgeon: Pearline Norman RAMAN, MD;  Location: [facility] PV LAB;  Service: Cardiovascular;  Laterality: Right;   AV FISTULA PLACEMENT Right 09/12/2020   Procedure: RIGHT ARTERIOVENOUS (AV) FISTULA CREATION;  Surgeon: Harvey Carlin BRAVO, MD;  Location: [facility] OR;  Service: Vascular;  Laterality: Right;   BASCILIC VEIN TRANSPOSITION Right 10/27/2020   Procedure: RIGHT UPPER EXTREMITY SECOND STAGE BASCILIC VEIN TRANSPOSITION;  Surgeon: Harvey Carlin BRAVO, MD;  Location[facility] OR;  Service: Vascular;  Laterality: Right;   BIOPSY  03/11/2022   Procedure: BIOPSY;  Surgeon: Wilhelmenia Aloha Raddle., MD;  Location: [facility] ENDOSCOPY;  Service: Gastroenterology;;   COLONOSCOPY WITH PROPOFOL  N/A 03/11/2022   Procedure: COLONOSCOPY WITH PROPOFOL ;  Surgeon: Wilhelmenia Aloha Raddle., MD;  Location: [facility] ENDOSCOPY;  Service: Gastroenterology;  Laterality: N/A;   foramen magnum ependymoma surgery  2003   Dr Mora   JOINT REPLACEMENT Bilateral    NASAL SINUS SURGERY     1973    PERIPHERAL VASCULAR BALLOON ANGIOPLASTY  02/03/2024   Procedure: PERIPHERAL VASCULAR BALLOON ANGIOPLASTY;  Surgeon: Norine Manuelita LABOR, MD;  Location: [facility]AS[facility] LAB;  Service: Cardiovascular;;  Inflow Basilic Vein   REVERSE SHOULDER ARTHROPLASTY Right 06/03/2022   Procedure: REVERSE SHOULDER ARTHROPLASTY;  Surgeon: Melita Drivers, MD;  Location: [facility] ORS;  Service: Orthopedics;  Laterality: Right;    TONSILLECTOMY     TOTAL KNEE ARTHROPLASTY     L 2008, R 2009, R 2016- Dr hiram   TOTAL KNEE REVISION Right  03/05/2015   Procedure: RIGHT TOTAL KNEE ARTHROPLASTY REVISION;  Surgeon: Dempsey Moan, MD;  Location: [facility] ORS;  Service: Orthopedics;  Laterality: Right;   VENOUS ANGIOPLASTY  05/30/2024   Procedure: VENOUS ANGIOPLASTY;  Surgeon: Pearline Norman RAMAN, MD;  Location: [facility] PV LAB;  Service: Cardiovascular;;  central   Family History  Problem Relation Age of Onset   Depression Mother    Hypertension Mother    Stroke Mother 6   Diabetes Mother    Diabetes Father    Hyperlipidemia Father    Hypertension Father    Crohn's disease Maternal Uncle    Diabetes Brother    Heart disease Brother    Hyperlipidemia Brother    Hypertension Brother    Prostate cancer Brother    Diabetes Brother    Heart disease Brother        Heart Disease before age 49   Heart attack Brother    Stroke Brother        X's 2   Hyperlipidemia Brother    Hypertension Brother    Diabetes Other    Colon cancer Neg Hx    Esophageal cancer Neg Hx    Stomach cancer Neg Hx    Pancreatic cancer Neg Hx    Breast cancer Neg Hx    BRCA 1/2 Neg Hx    Social History   Socioeconomic History   Marital status: Single    Spouse name: Not on file   Number of children: 0   Years of education: Not on file   Highest education level: Not on file  Occupational History   Not on file  Tobacco Use   Smoking status: Former    Current packs/day: 0.00    Average packs/day: 1 pack/day for 20.0 years (20.0 ttl pk-yrs)    Types: Cigarettes    Start date: 12/13/1968    Quit date: 12/13/1988    Years since quitting: 35.7   Smokeless tobacco: Never  Vaping Use   Vaping status: Never Used  Substance and Sexual Activity   Alcohol  use: Not Currently    Alcohol /week: 0.0 standard drinks of alcohol     Comment: rarely   Drug use: No   Sexual activity: Not Currently  Other Topics Concern   Not on file  Social History Narrative   Are you right handed or left handed? Right   Are you currently employed ?    What is your current  occupation? Retired    Do you live at home alone?yes   Who lives with you?    What type of home do you live in: 1 story or 2 story? Apartment with Magazine features editor none   Social Drivers of Health   Financial Resource Strain: Low Risk  (08/17/2024)   Overall Financial Resource Strain (CARDIA)    Difficulty of Paying Living Expenses: Not hard at all  Food Insecurity: No Food Insecurity (08/17/2024)   Hunger Vital Sign    Worried About Running Out of Food in the Last Year: Never true    Ran Out of Food in the Last Year: Never true  Transportation Needs: No Transportation Needs (08/17/2024)   PRAPARE - Administrator, Civil Service (Medical): No    Lack of Transportation (Non-Medical): No  Physical Activity: Inactive (08/17/2024)   Exercise Vital Sign    Days of Exercise per Week: 0 days    Minutes of Exercise per Session: 0 min  Stress: No Stress Concern Present (08/17/2024)   [employer] of Occupational Health - Occupational Stress Questionnaire    Feeling of Stress: Not at all  Social Connections: Socially Isolated (08/17/2024)   Social Connection and Isolation Panel    Frequency of Communication with Friends and Family: More  than three times a week    Frequency of Social Gatherings with Friends and Family: Twice a week    Attends Religious Services: Never    Database administrator or Organizations: No    Attends Engineer, structural: Never    Marital Status: Never married    Tobacco Counseling Counseling given: Not Answered    Clinical Intake:  Pre-visit preparation completed: Yes  Pain : No/denies pain     BMI - recorded: 33.33 Nutritional Status: BMI > 30  Obese Nutritional Risks: None Diabetes: Yes CBG done?: No Did pt. bring in CBG monitor from home?: No  Lab Results  Component Value Date   HGBA1C 6.3 (A) 06/13/2024   HGBA1C 6.5 08/22/2023   HGBA1C 6.3 (H) 03/10/2022     How often do you need to have someone help you when you read  instructions, pamphlets, or other written materials from your doctor or pharmacy?: 1 - Never  Interpreter Needed?: No  Information entered by :: Verdie Saba, CMA   Activities of Daily Living     08/17/2024    1:49 PM 05/30/2024    9:33 AM  In your present state of health, do you have any difficulty performing the following activities:  Hearing? 0 0  Vision? 0 0  Difficulty concentrating or making decisions? 0 0  Walking or climbing stairs? 0   Dressing or bathing? 0   Doing errands, shopping? 0   Preparing Food and eating ? N   Using the Toilet? N   In the past six months, have you accidently leaked urine? N   Do you have problems with loss of bowel control? Y   Managing your Medications? N   Managing your Finances? N   Housekeeping or managing your Housekeeping? N     Patient Care Team: Plotnikov, Karlynn GAILS, MD as PCP - General Croitoru, Jerel, MD as PCP - Cardiology (Cardiology) Arnaldo Juliene RAMAN, MD as Attending Physician (Sports Medicine) Prescilla Beams, MD as Consulting Physician (Nephrology) [facility] Marlee Bernardino NOVAK, MD as Attending Physician (Nephrology) Camella Fallow, MD as Consulting Physician (Orthopedic Surgery) Melita Drivers, MD as Consulting Physician (Orthopedic Surgery) Szabat, Toribio BROCKS, [facility] (Inactive) as Pharmacist (Pharmacist) Darden Planas, NP as Nurse Practitioner Croitoru, Jerel, MD as Consulting Physician (Cardiology) Madelyn Deanne BRAVO, OD as Consulting Physician (Optometry) Madelyn Deanne BRAVO, OD (Optometry)  I have updated your Care Teams any recent Medical Services you may have received from other providers in the past year.     Assessment:   This is a routine wellness examination for [name].  Hearing/Vision screen Hearing Screening - Comments:: Denies hearing difficulties   Vision Screening - Comments:: Wears rx glasses - up to date with routine eye exams with Deanne Madelyn   Goals Addressed               This  Visit's Progress     Patient Stated (pt-stated)        Patient stated she plans to stay safe and avoid falls       Depression Screen     08/17/2024    1:51 PM 08/29/2023   10:54 AM 08/22/2023   11:06 AM 06/08/2023    4:10 PM 04/04/2023    2:07 PM 01/24/2023   10:32 AM 08/02/2022    4:17 PM  PHQ 2/9 Scores  PHQ - 2 Score 0 3 0 0 0 0 5  PHQ- 9 Score 0    0 0 22  Fall Risk     08/17/2024    1:49 PM 03/28/2024   12:55 PM 03/05/2024    3:52 PM 08/29/2023   10:55 AM 08/22/2023   11:06 AM  Fall Risk   Falls in the past year? 1 1 1 1 1   Number falls in past yr: 0 1 1 1 1   Comment 1      Injury with Fall? 0 1 1 1 1   Risk for fall due to : History of fall(s);Impaired balance/gait  Impaired mobility;History of fall(s) History of fall(s);Impaired balance/gait History of fall(s)  Follow up Falls prevention discussed;Falls evaluation completed Falls evaluation completed Falls evaluation completed Falls evaluation completed Falls evaluation completed    MEDICARE RISK AT HOME:  Medicare Risk at Home Any stairs in or around the home?: Yes If so, are there any without handrails?: No Home free of loose throw rugs in walkways, pet beds, electrical cords, etc?: Yes Adequate lighting in your home to reduce risk of falls?: Yes Life alert?: Yes (smartwatch) Use of a cane, walker or w/c?: No Grab bars in the bathroom?: Yes Shower chair or bench in shower?: Yes Elevated toilet seat or a handicapped toilet?: No  TIMED UP AND GO:  Was the test performed?  No  Cognitive Function: 6CIT completed        08/17/2024    1:53 PM 08/29/2023   10:56 AM 08/02/2022    4:27 PM  6CIT Screen  What Year? 0 points 0 points 0 points  What month? 0 points 0 points 0 points  What time? 0 points 0 points 0 points  Count back from 20 0 points 0 points 0 points  Months in reverse 0 points 0 points 0 points  Repeat phrase 0 points 0 points 0 points  Total Score 0 points 0 points 0 points     Immunizations Immunization History  Administered Date(s) Administered   Fluad Quad(high Dose 65+) 08/29/2019, 10/27/2020, 08/31/2022   Fluad Trivalent(High Dose 65+) 08/22/2023   Hep A / Hep B 01/16/2019, 04/16/2019, 07/17/2019   INFLUENZA, HIGH DOSE SEASONAL PF 09/19/2017, 10/12/2018, 08/29/2019, 10/23/2020, 09/24/2021   Influenza Split 08/20/2010, 08/14/2011, 09/28/2017   Influenza Whole 08/20/2010, 08/14/2011   Influenza,inj,Quad PF,6+ Mos 09/19/2015, 08/20/2016   Influenza-Unspecified 09/12/2013, 09/16/2014, 09/19/2015, 08/20/2016, 09/28/2017, 10/27/2020, 10/22/2021   PFIZER Comirnaty(Gray Top)Covid-19 Tri-Sucrose Vaccine 03/22/2021   PFIZER(Purple Top)SARS-COV-2 Vaccination 01/18/2020, 02/08/2020, 09/18/2020, 10/27/2020   Pneumococcal Conjugate-13 03/20/2014   Pneumococcal Polysaccharide-23 02/04/2010, 11/29/2020   Respiratory Syncytial Virus Vaccine ,Recomb Aduvanted(Arexvy ) 09/07/2022   Td 07/15/2004   Td (Adult), 2 Lf Tetanus Toxid, Preservative Free 07/15/2004   Td (Adult),5 Lf Tetanus Toxid, Preservative Free 07/15/2004   Tdap 08/20/2016   Unspecified SARS-COV-2 Vaccination 10/23/2021   Zoster Recombinant(Shingrix) 10/23/2019, 04/29/2020   Zoster, Live 12/19/2013   Zoster, Unspecified 10/23/2019, 04/29/2020    Screening Tests Health Maintenance  Topic Date Due   FOOT EXAM  04/28/2017   Influenza Vaccine  07/13/2024   COVID-19 Vaccine (7 - 2025-26 season) 08/13/2024   OPHTHALMOLOGY EXAM  08/23/2024   HEMOGLOBIN A1C  12/14/2024   Medicare Annual Wellness (AWV)  08/17/2025   MAMMOGRAM  07/18/2026   DTaP/Tdap/Td (4 - Td or Tdap) 08/20/2026   Colonoscopy  03/11/2029   Pneumococcal Vaccine: 50+ Years  Completed   DEXA SCAN  Completed   Hepatitis C Screening  Completed   Zoster Vaccines- Shingrix  Completed   HPV VACCINES  Aged Out   Meningococcal B Vaccine  Aged Out   Hepatitis B Vaccines  19-59 Average Risk  Discontinued    Health Maintenance Items Addressed:  08/17/2024  Additional Screening:  Vision Screening: Recommended annual ophthalmology exams for early detection of glaucoma and other disorders of the eye. Is the patient up to date with their annual eye exam?  Yes  Who is the provider or what is the name of the office in which the patient attends annual eye exams? Patient has had an eye exam w/Dr Deanne Mate.  Dental Screening: Recommended annual dental exams for proper oral hygiene  Community Resource Referral / Chronic Care Management: CRR required this visit?  No   CCM required this visit?  No   Plan:    I have personally reviewed and noted the following in the patient's chart:   Medical and social history Use of alcohol , tobacco or illicit drugs  Current medications and supplements including opioid prescriptions. Patient is not currently taking opioid prescriptions. Functional ability and status Nutritional status Physical activity Advanced directives List of other physicians Hospitalizations, surgeries, and ER visits in previous 12 months Vitals Screenings to include cognitive, depression, and falls Referrals and appointments  In addition, I have reviewed and discussed with patient certain preventive protocols, quality metrics, and best practice recommendations. A written personalized care plan for preventive services as well as general preventive health recommendations were provided to patient.   Verdie CHRISTELLA Saba, CMA   08/17/2024   After Visit Summary: (MyChart) Due to this being a telephonic visit, the after visit summary with patients personalized plan was offered to patient via MyChart   Notes: Nothing significant to report at this time.

## 2024-08-18 DIAGNOSIS — D509 Iron deficiency anemia, unspecified: Secondary | ICD-10-CM | POA: Diagnosis not present

## 2024-08-18 DIAGNOSIS — N2581 Secondary hyperparathyroidism of renal origin: Secondary | ICD-10-CM | POA: Diagnosis not present

## 2024-08-18 DIAGNOSIS — D631 Anemia in chronic kidney disease: Secondary | ICD-10-CM | POA: Diagnosis not present

## 2024-08-18 DIAGNOSIS — Z992 Dependence on renal dialysis: Secondary | ICD-10-CM | POA: Diagnosis not present

## 2024-08-18 DIAGNOSIS — N186 End stage renal disease: Secondary | ICD-10-CM | POA: Diagnosis not present

## 2024-08-21 DIAGNOSIS — Z992 Dependence on renal dialysis: Secondary | ICD-10-CM | POA: Diagnosis not present

## 2024-08-21 DIAGNOSIS — N186 End stage renal disease: Secondary | ICD-10-CM | POA: Diagnosis not present

## 2024-08-21 DIAGNOSIS — D631 Anemia in chronic kidney disease: Secondary | ICD-10-CM | POA: Diagnosis not present

## 2024-08-21 DIAGNOSIS — D509 Iron deficiency anemia, unspecified: Secondary | ICD-10-CM | POA: Diagnosis not present

## 2024-08-21 DIAGNOSIS — N2581 Secondary hyperparathyroidism of renal origin: Secondary | ICD-10-CM | POA: Diagnosis not present

## 2024-08-23 DIAGNOSIS — N186 End stage renal disease: Secondary | ICD-10-CM | POA: Diagnosis not present

## 2024-08-23 DIAGNOSIS — D631 Anemia in chronic kidney disease: Secondary | ICD-10-CM | POA: Diagnosis not present

## 2024-08-23 DIAGNOSIS — N2581 Secondary hyperparathyroidism of renal origin: Secondary | ICD-10-CM | POA: Diagnosis not present

## 2024-08-23 DIAGNOSIS — Z992 Dependence on renal dialysis: Secondary | ICD-10-CM | POA: Diagnosis not present

## 2024-08-23 DIAGNOSIS — D509 Iron deficiency anemia, unspecified: Secondary | ICD-10-CM | POA: Diagnosis not present

## 2024-08-25 DIAGNOSIS — N2581 Secondary hyperparathyroidism of renal origin: Secondary | ICD-10-CM | POA: Diagnosis not present

## 2024-08-25 DIAGNOSIS — D631 Anemia in chronic kidney disease: Secondary | ICD-10-CM | POA: Diagnosis not present

## 2024-08-25 DIAGNOSIS — Z992 Dependence on renal dialysis: Secondary | ICD-10-CM | POA: Diagnosis not present

## 2024-08-25 DIAGNOSIS — N186 End stage renal disease: Secondary | ICD-10-CM | POA: Diagnosis not present

## 2024-08-25 DIAGNOSIS — D509 Iron deficiency anemia, unspecified: Secondary | ICD-10-CM | POA: Diagnosis not present

## 2024-08-28 DIAGNOSIS — N186 End stage renal disease: Secondary | ICD-10-CM | POA: Diagnosis not present

## 2024-08-28 DIAGNOSIS — N2581 Secondary hyperparathyroidism of renal origin: Secondary | ICD-10-CM | POA: Diagnosis not present

## 2024-08-28 DIAGNOSIS — D509 Iron deficiency anemia, unspecified: Secondary | ICD-10-CM | POA: Diagnosis not present

## 2024-08-28 DIAGNOSIS — D631 Anemia in chronic kidney disease: Secondary | ICD-10-CM | POA: Diagnosis not present

## 2024-08-28 DIAGNOSIS — Z992 Dependence on renal dialysis: Secondary | ICD-10-CM | POA: Diagnosis not present

## 2024-08-30 DIAGNOSIS — T829XXA Unspecified complication of cardiac and vascular prosthetic device, implant and graft, initial encounter: Secondary | ICD-10-CM | POA: Diagnosis not present

## 2024-08-30 DIAGNOSIS — D509 Iron deficiency anemia, unspecified: Secondary | ICD-10-CM | POA: Diagnosis not present

## 2024-08-30 DIAGNOSIS — N186 End stage renal disease: Secondary | ICD-10-CM | POA: Diagnosis not present

## 2024-08-30 DIAGNOSIS — Z992 Dependence on renal dialysis: Secondary | ICD-10-CM | POA: Diagnosis not present

## 2024-08-30 DIAGNOSIS — D631 Anemia in chronic kidney disease: Secondary | ICD-10-CM | POA: Diagnosis not present

## 2024-08-30 DIAGNOSIS — N2581 Secondary hyperparathyroidism of renal origin: Secondary | ICD-10-CM | POA: Diagnosis not present

## 2024-09-01 DIAGNOSIS — Z992 Dependence on renal dialysis: Secondary | ICD-10-CM | POA: Diagnosis not present

## 2024-09-01 DIAGNOSIS — N2581 Secondary hyperparathyroidism of renal origin: Secondary | ICD-10-CM | POA: Diagnosis not present

## 2024-09-01 DIAGNOSIS — D509 Iron deficiency anemia, unspecified: Secondary | ICD-10-CM | POA: Diagnosis not present

## 2024-09-01 DIAGNOSIS — D631 Anemia in chronic kidney disease: Secondary | ICD-10-CM | POA: Diagnosis not present

## 2024-09-01 DIAGNOSIS — N186 End stage renal disease: Secondary | ICD-10-CM | POA: Diagnosis not present

## 2024-09-04 DIAGNOSIS — Z992 Dependence on renal dialysis: Secondary | ICD-10-CM | POA: Diagnosis not present

## 2024-09-04 DIAGNOSIS — D509 Iron deficiency anemia, unspecified: Secondary | ICD-10-CM | POA: Diagnosis not present

## 2024-09-04 DIAGNOSIS — N2581 Secondary hyperparathyroidism of renal origin: Secondary | ICD-10-CM | POA: Diagnosis not present

## 2024-09-04 DIAGNOSIS — N186 End stage renal disease: Secondary | ICD-10-CM | POA: Diagnosis not present

## 2024-09-04 DIAGNOSIS — D631 Anemia in chronic kidney disease: Secondary | ICD-10-CM | POA: Diagnosis not present

## 2024-09-06 DIAGNOSIS — D631 Anemia in chronic kidney disease: Secondary | ICD-10-CM | POA: Diagnosis not present

## 2024-09-06 DIAGNOSIS — Z992 Dependence on renal dialysis: Secondary | ICD-10-CM | POA: Diagnosis not present

## 2024-09-06 DIAGNOSIS — D509 Iron deficiency anemia, unspecified: Secondary | ICD-10-CM | POA: Diagnosis not present

## 2024-09-06 DIAGNOSIS — N186 End stage renal disease: Secondary | ICD-10-CM | POA: Diagnosis not present

## 2024-09-06 DIAGNOSIS — N2581 Secondary hyperparathyroidism of renal origin: Secondary | ICD-10-CM | POA: Diagnosis not present

## 2024-09-07 ENCOUNTER — Other Ambulatory Visit: Payer: Self-pay

## 2024-09-07 ENCOUNTER — Encounter (HOSPITAL_COMMUNITY)
Admission: RE | Disposition: A | Discharge status: Home / Self Care | Payer: Self-pay | Source: Home / Self Care | Attending: Vascular Surgery

## 2024-09-07 ENCOUNTER — Ambulatory Visit (HOSPITAL_COMMUNITY)
Admission: RE | Admit: 2024-09-07 | Discharge: 2024-09-07 | Disposition: A | Discharge status: Home / Self Care | Attending: Vascular Surgery | Admitting: Vascular Surgery

## 2024-09-07 DIAGNOSIS — I871 Compression of vein: Secondary | ICD-10-CM | POA: Diagnosis not present

## 2024-09-07 DIAGNOSIS — I120 Hypertensive chronic kidney disease with stage 5 chronic kidney disease or end stage renal disease: Secondary | ICD-10-CM | POA: Diagnosis not present

## 2024-09-07 DIAGNOSIS — Z992 Dependence on renal dialysis: Secondary | ICD-10-CM | POA: Diagnosis not present

## 2024-09-07 DIAGNOSIS — N186 End stage renal disease: Secondary | ICD-10-CM | POA: Insufficient documentation

## 2024-09-07 DIAGNOSIS — Z7984 Long term (current) use of oral hypoglycemic drugs: Secondary | ICD-10-CM | POA: Diagnosis not present

## 2024-09-07 DIAGNOSIS — Y832 Surgical operation with anastomosis, bypass or graft as the cause of abnormal reaction of the patient, or of later complication, without mention of misadventure at the time of the procedure: Secondary | ICD-10-CM | POA: Diagnosis not present

## 2024-09-07 DIAGNOSIS — T82838A Hemorrhage of vascular prosthetic devices, implants and grafts, initial encounter: Secondary | ICD-10-CM | POA: Diagnosis not present

## 2024-09-07 DIAGNOSIS — E1122 Type 2 diabetes mellitus with diabetic chronic kidney disease: Secondary | ICD-10-CM | POA: Diagnosis not present

## 2024-09-07 DIAGNOSIS — T82858A Stenosis of vascular prosthetic devices, implants and grafts, initial encounter: Secondary | ICD-10-CM | POA: Insufficient documentation

## 2024-09-07 HISTORY — PX: A/V SHUNT INTERVENTION: CATH118220

## 2024-09-07 HISTORY — PX: VENOUS ANGIOPLASTY: CATH118376

## 2024-09-07 LAB — GLUCOSE, CAPILLARY: Glucose-Capillary: 189 mg/dL — ABNORMAL HIGH (ref 70–99)

## 2024-09-07 SURGERY — VENOUS ANGIOPLASTY
Site: Arm Upper | Laterality: Right

## 2024-09-07 MED ORDER — IODIXANOL 320 MG/ML IV SOLN
INTRAVENOUS | Status: DC | PRN
  Administered 2024-09-07: 30 mL via INTRAVENOUS

## 2024-09-07 MED ORDER — LIDOCAINE HCL (PF) 1 % IJ SOLN
INTRAMUSCULAR | Status: DC | PRN
  Administered 2024-09-07: 5 mL

## 2024-09-07 MED ORDER — LIDOCAINE HCL (PF) 1 % IJ SOLN
INTRAMUSCULAR | Status: AC
  Filled 2024-09-07: qty 30

## 2024-09-07 MED ORDER — HEPARIN (PORCINE) IN NACL 1000-0.9 UT/500ML-% IV SOLN
INTRAVENOUS | Status: DC | PRN
  Administered 2024-09-07: 500 mL

## 2024-09-07 SURGICAL SUPPLY — 11 items
BALLOON MUSTANG 12.0X40 75 (BALLOONS) IMPLANT
BALLOON MUSTANG 8.0X40 75 (BALLOONS) IMPLANT
KIT ENCORE 26 ADVANTAGE (KITS) IMPLANT
KIT MICROPUNCTURE NIT STIFF (SHEATH) IMPLANT
SHEATH PINNACLE R/O II 6F 4CM (SHEATH) IMPLANT
SHEATH PINNACLE R/O II 7F 4CM (SHEATH) IMPLANT
SHEATH PROBE COVER 6X72 (BAG) IMPLANT
TRAY PV CATH (CUSTOM PROCEDURE TRAY) ×3 IMPLANT
TUBING CIL FLEX 10 FLL-RA (TUBING) IMPLANT
WIRE BENTSON .035X145CM (WIRE) IMPLANT
WIRE TORQFLEX AUST .018X40CM (WIRE) IMPLANT

## 2024-09-07 NOTE — Consult Note (Deleted)
 Hospital Consult    Reason for Consult: Fistula malfunction Requesting Physician: Nephrology MRN #:  [id number]  History of Present Illness: This is a 73 y.o. female known to the vascular surgery service having previously undergone left arm brachiobasilic fistula creation with subsequent balloon venoplasty for central stenosis.  Most recent intervention was in June -2 mm balloon venoplasty left innominate vein.  She presents today after prolonged bleeding after dialysis.  Denies symptoms of steal syndrome  Past Medical History:  Diagnosis Date   Anemia    Anxiety    Brain tumor (benign) ([name])    Colitis 2010   microscopic- Dr Abran   Depression    Diabetes mellitus    type II   Dyspnea    with exertion   ESRD (end stage renal disease) ([name])    TTUSAT [address]    Fibromyalgia    GERD (gastroesophageal reflux disease)    Headache    History of blood transfusion    after knee surgery   Hypertension    discontinued all diuretics and antihypertensives   IBS (irritable bowel syndrome)    LBP (low back pain)    Neuropathy    feet bilat    Osteoarthritis    Osteopenia    Pneumonia    hx of 2014    Rotator cuff tear, right    Sinusitis    currently being treated with antibiotic will complete 03/04/2015    Past Surgical History:  Procedure Laterality Date   A/V FISTULAGRAM N/A 02/03/2024   Procedure: A/V Fistulagram;  Surgeon: Norine Manuelita LABOR, MD;  Location: [facility];  Service: Cardiovascular;  Laterality: N/A;   A/V SHUNT INTERVENTION Right 05/30/2024   Procedure: A/V SHUNT INTERVENTION;  Surgeon: Pearline Norman RAMAN, MD;  Location: [facility];  Service: Cardiovascular;  Laterality: Right;   AV FISTULA PLACEMENT Right 09/12/2020   Procedure: RIGHT ARTERIOVENOUS (AV) FISTULA CREATION;  Surgeon: Harvey Carlin BRAVO, MD;  Location: [facility] OR;  Service: Vascular;  Laterality: Right;   BASCILIC VEIN TRANSPOSITION Right 10/27/2020   Procedure: RIGHT UPPER EXTREMITY SECOND  STAGE BASCILIC VEIN TRANSPOSITION;  Surgeon: Harvey Carlin BRAVO, MD;  Location: [facility] OR;  Service: Vascular;  Laterality: Right;   BIOPSY  03/11/2022   Procedure: BIOPSY;  Surgeon: Wilhelmenia Aloha Raddle., MD;  Location: [facility] ENDOSCOPY;  Service: Gastroenterology;;   COLONOSCOPY WITH PROPOFOL  N/A 03/11/2022   Procedure: COLONOSCOPY WITH PROPOFOL ;  Surgeon: Wilhelmenia Aloha Raddle., MD;  Location: [facility] ENDOSCOPY;  Service: Gastroenterology;  Laterality: N/A;   foramen magnum ependymoma surgery  2003   Dr Mora   JOINT REPLACEMENT Bilateral    NASAL SINUS SURGERY     1973    PERIPHERAL VASCULAR BALLOON ANGIOPLASTY  02/03/2024   Procedure: PERIPHERAL VASCULAR BALLOON ANGIOPLASTY;  Surgeon: Norine Manuelita LABOR, MD;  Location: [facility];  Service: Cardiovascular;;  Inflow Basilic Vein   REVERSE SHOULDER ARTHROPLASTY Right 06/03/2022   Procedure: REVERSE SHOULDER ARTHROPLASTY;  Surgeon: Melita Drivers, MD;  Location: [facility] ORS;  Service: Orthopedics;  Laterality: Right;    TONSILLECTOMY     TOTAL KNEE ARTHROPLASTY     L 2008, R 2009, R 2016- Dr hiram   TOTAL KNEE REVISION Right 03/05/2015   Procedure: RIGHT TOTAL KNEE ARTHROPLASTY REVISION;  Surgeon: Dempsey Moan, MD;  Location: [facility] ORS;  Service: Orthopedics;  Laterality: Right;   VENOUS ANGIOPLASTY  05/30/2024   Procedure: VENOUS ANGIOPLASTY;  Surgeon: Pearline Norman RAMAN, MD;  Location: [facility];  Service:  Cardiovascular;;  central    Allergies  Allergen Reactions   Erythromycin  Diarrhea and Nausea And Vomiting   Gabapentin Other (See Comments)    Confusion and falling  Hallucinations    Prior to Admission medications   Medication Sig Start Date End Date Taking? Authorizing Provider  Accu-Chek Softclix Lancets lancets Use 1 lancet up to 4 times daily as directed 03/12/22   Samtani, Jai-Gurmukh, MD  acetaminophen  (TYLENOL ) 500 MG tablet Take 1,000 mg by mouth 2 (two) times daily as needed for headache.    [provider]   aspirin  EC 81 MG tablet Take 1 tablet (81 mg total) by mouth daily. 04/27/24 04/27/25  Plotnikov, Karlynn GAILS, MD  aspirin -acetaminophen -caffeine (EXCEDRIN MIGRAINE) 250-250-65 MG tablet Take 1 tablet by mouth daily as needed for headache or migraine. Max 3 doses in one week    [provider]  azelastine  (ASTELIN ) 0.1 % nasal spray Place 2 sprays into both nostrils 2 (two) times daily. Use in each nostril as directed 11/30/23   Okey Burns, MD  B Complex-C-Folic Acid  (RENA-VITE PO) Take 1 tablet by mouth every morning.    [provider]  blood glucose meter kit and supplies Dispense based on patient and insurance preference. Used to check blood sugar daily, DX: E11.9 OneTouch 12/20/17   Plotnikov, Aleksei V, MD  Blood Glucose Monitoring Suppl (ACCU-CHEK GUIDE ME) w/Device KIT Use as directed to check blood sugar four times daily. 03/12/22   Samtani, Jai-Gurmukh, MD  buPROPion  (WELLBUTRIN  XL) 300 MG 24 hr tablet Take 1 tablet (300 mg total) by mouth in the morning. 08/26/23     carboxymethylcellulose (REFRESH PLUS) 0.5 % SOLN Place 1 drop into both eyes 3 (three) times daily as needed (dry eyes/irritation).    [provider]  cholecalciferol (VITAMIN D3) 25 MCG (1000 UNIT) tablet Take by mouth daily.    [provider]  cinacalcet  (SENSIPAR ) 30 MG tablet Take 30 mg by mouth Every Tuesday,Thursday,and Saturday with dialysis.    [provider]  cyclobenzaprine  (FLEXERIL ) 10 MG tablet Take 1 tablet (10 mg total) by mouth 3 (three) times daily as needed for muscle spasms. 07/09/24   Rollene Almarie LABOR, MD  diphenhydramine -acetaminophen  (TYLENOL  PM) 25-500 MG TABS tablet Take 2 tablets by mouth at bedtime as needed (sleep).    [provider]  docusate sodium  (COLACE) 50 MG capsule Take 50 mg by mouth 2 (two) times daily.    [provider]  doxycycline  (VIBRA -TABS) 100 MG tablet Take 1 tablet (100 mg total) by mouth 2 (two) times daily.  06/13/24   Plotnikov, Aleksei V, MD  erythromycin  ophthalmic ointment Apply to stye 4 times daily as directed. 06/13/24   Plotnikov, Aleksei V, MD  Evolocumab  (REPATHA  SURECLICK) 140 MG/ML SOAJ INJECT 1 PEN SUBCUTANEOUSLY  EVERY 2 WEEKS 05/01/24   Croitoru, Mihai, MD  famotidine  (PEPCID ) 20 MG tablet Take 1 tablet (20 mg total) by mouth 2 (two) times daily. 05/04/24   Soldatova, Liuba, MD  FLUoxetine  (PROZAC ) 40 MG capsule Take 1 capsule (40 mg total) by mouth in the morning. 08/26/23     fluticasone  (FLONASE ) 50 MCG/ACT nasal spray Place 2 sprays into both nostrils daily. 11/30/23   Soldatova, Liuba, MD  glimepiride  (AMARYL ) 1 MG tablet TAKE 1 TABLET BY MOUTH DAILY  WITH BREAKFAST 12/13/23   Plotnikov, Aleksei V, MD  glucose blood (ACCU-CHEK GUIDE) test strip Use to check blood sugar up to four times daily. 03/12/22   Samtani, Jai-Gurmukh, MD  ketoconazole  (  NIZORAL ) 2 % cream Apply 1 Application topically daily. 04/04/23   Plotnikov, Aleksei V, MD  lanthanum (FOSRENOL) 1000 MG chewable tablet Chew 1,000-2,000 mg by mouth See admin instructions. Taking 2000 mg with meals and 1000 mg  with snacks    [provider]  lidocaine  (LIDODERM ) 5 % PLACE 2 PATCHES ONTO THE SKIN  DAILY 08/13/22   Plotnikov, Aleksei V, MD  lidocaine  (XYLOCAINE ) 5 % ointment Apply 1 Application topically 3 (three) times daily as needed for moderate pain. 08/22/23   Merlynn Niki FALCON, FNP  LORazepam  (ATIVAN ) 2 MG tablet Take 1 tablet (2 mg total) by mouth 2 (two) times daily as needed for anxiety. 08/26/23     LORazepam  (ATIVAN ) 2 MG tablet Take 1 tablet (2 mg total) by mouth 2 (two) times daily as needed for anxiety. 03/14/24     midodrine  (PROAMATINE ) 10 MG tablet Take 3 tablets (30 mg total) by mouth 3 (three) times a week. Take 30 minutes before dialysis and extra tablet during dialysis. Patient taking differently: Take 20 mg by mouth 3 (three) times a week. Take 30 minutes before dialysis and extra tablet during dialysis. 11/21/23    Marlee Bernardino NOVAK, MD  multivitamin-lutein (OCUVITE-LUTEIN) CAPS capsule Take 1 capsule by mouth every morning.    [provider]  naloxone  (NARCAN ) nasal spray 4 mg/0.1 mL Take by nasal route every 3 minutes until patient awakes or EMS arrives. 01/20/22     ondansetron  (ZOFRAN ) 4 MG tablet Take 1 tablet (4 mg total) by mouth every 8 (eight) hours as needed for nausea or vomiting. 06/03/22   Shuford, Randine, PA-C  oxyCODONE  (ROXICODONE ) 15 MG immediate release tablet Take 1 tablet (15 mg total) by mouth 4 (four) times daily as needed. 02/22/24     pantoprazole  (PROTONIX ) 40 MG tablet TAKE 1 TABLET BY MOUTH TWICE  DAILY 01/05/24   Abran Norleen SAILOR, MD  pilocarpine  (SALAGEN ) 5 MG tablet TAKE 1 TABLET BY MOUTH TWICE  DAILY 05/24/24   Plotnikov, Aleksei V, MD  pioglitazone  (ACTOS ) 15 MG tablet TAKE 1 TABLET BY MOUTH DAILY 02/21/24   Plotnikov, Aleksei V, MD  polyethylene glycol (MIRALAX  / GLYCOLAX ) 17 g packet Take 17 g by mouth daily as needed for moderate constipation.    [provider]  PREVIDENT 5000 DRY MOUTH 1.1 % GEL dental gel Place 1 application  onto teeth in the morning and at bedtime. 10/25/21   [provider]  Semaglutide ,0.25 or 0.5MG /DOS, (OZEMPIC , 0.25 OR 0.5 MG/DOSE,) 2 MG/3ML SOPN INJECT SUBCUTANEOUSLY 0.5 MG  EVERY WEEK 05/20/24   Plotnikov, Aleksei V, MD  sodium chloride  (OCEAN) 0.65 % SOLN nasal spray Place 1 spray into both nostrils as needed. 05/04/24   Okey Burns, MD    Social History   Socioeconomic History   Marital status: Single    Spouse name: Not on file   Number of children: 0   Years of education: Not on file   Highest education level: Not on file  Occupational History   Not on file  Tobacco Use   Smoking status: Former    Current packs/day: 0.00    Average packs/day: 1 pack/day for 20.0 years (20.0 ttl pk-yrs)    Types: Cigarettes    Start date: 12/13/1968    Quit date: 12/13/1988    Years since quitting: 35.7   Smokeless tobacco: Never   Vaping Use   Vaping status: Never Used  Substance and Sexual Activity   Alcohol  use: Not Currently  Alcohol /week: 0.0 standard drinks of alcohol     Comment: rarely   Drug use: No   Sexual activity: Not Currently  Other Topics Concern   Not on file  Social History Narrative   Are you right handed or left handed? Right   Are you currently employed ?    What is your current occupation? Retired    Do you live at home alone?yes   Who lives with you?    What type of home do you live in: 1 story or 2 story? Apartment with Magazine features editor none   Social Drivers of Health   Financial Resource Strain: Low Risk  (08/17/2024)   Overall Financial Resource Strain (CARDIA)    Difficulty of Paying Living Expenses: Not hard at all  Food Insecurity: No Food Insecurity (08/17/2024)   Hunger Vital Sign    Worried About Running Out of Food in the Last Year: Never true    Ran Out of Food in the Last Year: Never true  Transportation Needs: No Transportation Needs (08/17/2024)   PRAPARE - Administrator, Civil Service (Medical): No    Lack of Transportation (Non-Medical): No  Physical Activity: Inactive (08/17/2024)   Exercise Vital Sign    Days of Exercise per Week: 0 days    Minutes of Exercise per Session: 0 min  Stress: No Stress Concern Present (08/17/2024)   [employer] of Occupational Health - Occupational Stress Questionnaire    Feeling of Stress: Not at all  Social Connections: Socially Isolated (08/17/2024)   Social Connection and Isolation Panel    Frequency of Communication with Friends and Family: More than three times a week    Frequency of Social Gatherings with Friends and Family: Twice a week    Attends Religious Services: Never    Database administrator or Organizations: No    Attends Banker Meetings: Never    Marital Status: Never married  Intimate Partner Violence: Not At Risk (08/17/2024)   Humiliation, Afraid, Rape, and Kick questionnaire     Fear of Current or Ex-Partner: No    Emotionally Abused: No    Physically Abused: No    Sexually Abused: No   Family History  Problem Relation Age of Onset   Depression Mother    Hypertension Mother    Stroke Mother 31   Diabetes Mother    Diabetes Father    Hyperlipidemia Father    Hypertension Father    Crohn's disease Maternal Uncle    Diabetes Brother    Heart disease Brother    Hyperlipidemia Brother    Hypertension Brother    Prostate cancer Brother    Diabetes Brother    Heart disease Brother        Heart Disease before age 8   Heart attack Brother    Stroke Brother        X's 2   Hyperlipidemia Brother    Hypertension Brother    Diabetes Other    Colon cancer Neg Hx    Esophageal cancer Neg Hx    Stomach cancer Neg Hx    Pancreatic cancer Neg Hx    Breast cancer Neg Hx    BRCA 1/2 Neg Hx     ROS: Otherwise negative unless mentioned in HPI  Physical Examination  Vitals:   09/07/24 0849 09/07/24 0857  BP: (!) 127/51 (!) 127/51  Pulse: 82 85  Resp: 12 14  Temp: 98.2 F (36.8 C)   SpO2:  96% 96%   There is no height or weight on file to calculate BMI.  General:  WDWN in NAD Gait: Not observed HENT: WNL, normocephalic Pulmonary: normal non-labored breathing, without Rales, rhonchi,  wheezing Cardiac: regular, without  Murmurs, rubs or gallops;  Abdomen:  soft, NT/ND, no masses Skin: without rashes Vascular Exam/Pulses: 2+ Extremities: without ischemic changes, without Gangrene , without cellulitis; without open wounds;  Musculoskeletal: no muscle wasting or atrophy  Neurologic: A&O X 3;  No focal weakness or paresthesias are detected; speech is fluent/normal Psychiatric:  The pt has Normal affect. Lymph:  Unremarkable  CBC    Component Value Date/Time   WBC 12.3 (H) 03/09/2023 1706   RBC 3.42 (L) 03/09/2023 1706   HGB 11.2 (L) 03/09/2023 1706   HCT 34.3 (L) 03/09/2023 1706   PLT 271.0 03/09/2023 1706   MCV 100.4 (H) 03/09/2023 1706   MCH  32.7 06/03/2022 0610   MCHC 32.6 03/09/2023 1706   RDW 13.8 03/09/2023 1706   LYMPHSABS 2.9 03/09/2023 1706   MONOABS 0.8 03/09/2023 1706   EOSABS 0.5 03/09/2023 1706   BASOSABS 0.1 03/09/2023 1706    BMET    Component Value Date/Time   NA 136 08/22/2023 1220   K 5.3 (H) 08/22/2023 1220   CL 90 (L) 08/22/2023 1220   CO2 33 (H) 08/22/2023 1220   GLUCOSE 148 (H) 08/22/2023 1220   BUN 49 (H) 08/22/2023 1220   CREATININE 5.83 (HH) 08/22/2023 1220   CALCIUM 9.7 08/22/2023 1220   GFRNONAA 14 (L) 06/03/2022 0610   GFRAA 66 (L) 03/07/2015 0449    COAGS: Lab Results  Component Value Date   INR 0.9 03/09/2023   INR 1.1 03/10/2022   INR 1.1 08/06/2021       ASSESSMENT/PLAN: This is a 73 y.o. female with history of left arm brachial basilic fistula and history of balloon venoplasty of the innominate vein.  She presents with prolonged bleeding.  On physical exam, there is an excellent thrill within the fistula.  No pulsatility.  She is aware that this could be diagnostic.  After discussing the risks and benefits of left arm fistulogram in an effort to define and treat the possible central stenosis leading to prolonged bleeding after decannulation, Jamirah elected to proceed    Fonda FORBES Rim MD [facility] [phone] 09/07/2024  9:19 AM

## 2024-09-07 NOTE — H&P (Addendum)
 Hospital Consult    Reason for Consult: Fistula malfunction Requesting Physician: Nephrology MRN #:  [id number]  History of Present Illness: This is a 73 y.o. female known to the vascular surgery service having previously undergone right arm brachiobasilic fistula creation with subsequent balloon venoplasty for central stenosis.  Most recent intervention was in June 10 mm balloon venoplasty right innominate vein.  She presents today after prolonged bleeding after dialysis.  Denies symptoms of steal syndrome  Past Medical History:  Diagnosis Date   Anemia    Anxiety    Brain tumor (benign) ([name])    Colitis 2010   microscopic- Dr Abran   Depression    Diabetes mellitus    type II   Dyspnea    with exertion   ESRD (end stage renal disease) ([name])    TTUSAT [address]    Fibromyalgia    GERD (gastroesophageal reflux disease)    Headache    History of blood transfusion    after knee surgery   Hypertension    discontinued all diuretics and antihypertensives   IBS (irritable bowel syndrome)    LBP (low back pain)    Neuropathy    feet bilat    Osteoarthritis    Osteopenia    Pneumonia    hx of 2014    Rotator cuff tear, right    Sinusitis    currently being treated with antibiotic will complete 03/04/2015    Past Surgical History:  Procedure Laterality Date   A/V FISTULAGRAM N/A 02/03/2024   Procedure: A/V Fistulagram;  Surgeon: Norine Manuelita LABOR, MD;  Location: [facility];  Service: Cardiovascular;  Laterality: N/A;   A/V SHUNT INTERVENTION Right 05/30/2024   Procedure: A/V SHUNT INTERVENTION;  Surgeon: Pearline Norman RAMAN, MD;  Location: [facility];  Service: Cardiovascular;  Laterality: Right;   AV FISTULA PLACEMENT Right 09/12/2020   Procedure: RIGHT ARTERIOVENOUS (AV) FISTULA CREATION;  Surgeon: Harvey Carlin BRAVO, MD;  Location: [facility] OR;  Service: Vascular;  Laterality: Right;   BASCILIC VEIN TRANSPOSITION Right 10/27/2020   Procedure: RIGHT UPPER EXTREMITY  SECOND STAGE BASCILIC VEIN TRANSPOSITION;  Surgeon: Harvey Carlin BRAVO, MD;  Location: [facility] OR;  Service: Vascular;  Laterality: Right;   BIOPSY  03/11/2022   Procedure: BIOPSY;  Surgeon: Wilhelmenia Aloha Raddle., MD;  Location: [facility] ENDOSCOPY;  Service: Gastroenterology;;   COLONOSCOPY WITH PROPOFOL  N/A 03/11/2022   Procedure: COLONOSCOPY WITH PROPOFOL ;  Surgeon: Wilhelmenia Aloha Raddle., MD;  Location: [facility] ENDOSCOPY;  Service: Gastroenterology;  Laterality: N/A;   foramen magnum ependymoma surgery  2003   Dr Mora   JOINT REPLACEMENT Bilateral    NASAL SINUS SURGERY     1973    PERIPHERAL VASCULAR BALLOON ANGIOPLASTY  02/03/2024   Procedure: PERIPHERAL VASCULAR BALLOON ANGIOPLASTY;  Surgeon: Norine Manuelita LABOR, MD;  Location: [facility];  Service: Cardiovascular;;  Inflow Basilic Vein   REVERSE SHOULDER ARTHROPLASTY Right 06/03/2022   Procedure: REVERSE SHOULDER ARTHROPLASTY;  Surgeon: Melita Drivers, MD;  Location: [facility] ORS;  Service: Orthopedics;  Laterality: Right;    TONSILLECTOMY     TOTAL KNEE ARTHROPLASTY     L 2008, R 2009, R 2016- Dr hiram   TOTAL KNEE REVISION Right 03/05/2015   Procedure: RIGHT TOTAL KNEE ARTHROPLASTY REVISION;  Surgeon: Dempsey Moan, MD;  Location: [facility] ORS;  Service: Orthopedics;  Laterality: Right;   VENOUS ANGIOPLASTY  05/30/2024   Procedure: VENOUS ANGIOPLASTY;  Surgeon: Pearline Norman RAMAN, MD;  Location: [facility];  Service:  Cardiovascular;;  central    Allergies  Allergen Reactions   Erythromycin  Diarrhea and Nausea And Vomiting   Gabapentin Other (See Comments)    Confusion and falling  Hallucinations    Prior to Admission medications   Medication Sig Start Date End Date Taking? Authorizing Provider  Accu-Chek Softclix Lancets lancets Use 1 lancet up to 4 times daily as directed 03/12/22   Samtani, Jai-Gurmukh, MD  acetaminophen  (TYLENOL ) 500 MG tablet Take 1,000 mg by mouth 2 (two) times daily as needed for headache.    [provider]   aspirin  EC 81 MG tablet Take 1 tablet (81 mg total) by mouth daily. 04/27/24 04/27/25  Plotnikov, Karlynn GAILS, MD  aspirin -acetaminophen -caffeine (EXCEDRIN MIGRAINE) 250-250-65 MG tablet Take 1 tablet by mouth daily as needed for headache or migraine. Max 3 doses in one week    [provider]  azelastine  (ASTELIN ) 0.1 % nasal spray Place 2 sprays into both nostrils 2 (two) times daily. Use in each nostril as directed 11/30/23   Okey Burns, MD  B Complex-C-Folic Acid  (RENA-VITE PO) Take 1 tablet by mouth every morning.    [provider]  blood glucose meter kit and supplies Dispense based on patient and insurance preference. Used to check blood sugar daily, DX: E11.9 OneTouch 12/20/17   Plotnikov, Aleksei V, MD  Blood Glucose Monitoring Suppl (ACCU-CHEK GUIDE ME) w/Device KIT Use as directed to check blood sugar four times daily. 03/12/22   Samtani, Jai-Gurmukh, MD  buPROPion  (WELLBUTRIN  XL) 300 MG 24 hr tablet Take 1 tablet (300 mg total) by mouth in the morning. 08/26/23     carboxymethylcellulose (REFRESH PLUS) 0.5 % SOLN Place 1 drop into both eyes 3 (three) times daily as needed (dry eyes/irritation).    [provider]  cholecalciferol (VITAMIN D3) 25 MCG (1000 UNIT) tablet Take by mouth daily.    [provider]  cinacalcet  (SENSIPAR ) 30 MG tablet Take 30 mg by mouth Every Tuesday,Thursday,and Saturday with dialysis.    [provider]  cyclobenzaprine  (FLEXERIL ) 10 MG tablet Take 1 tablet (10 mg total) by mouth 3 (three) times daily as needed for muscle spasms. 07/09/24   Rollene Almarie LABOR, MD  diphenhydramine -acetaminophen  (TYLENOL  PM) 25-500 MG TABS tablet Take 2 tablets by mouth at bedtime as needed (sleep).    [provider]  docusate sodium  (COLACE) 50 MG capsule Take 50 mg by mouth 2 (two) times daily.    [provider]  doxycycline  (VIBRA -TABS) 100 MG tablet Take 1 tablet (100 mg total) by mouth 2 (two) times daily.  06/13/24   Plotnikov, Aleksei V, MD  erythromycin  ophthalmic ointment Apply to stye 4 times daily as directed. 06/13/24   Plotnikov, Aleksei V, MD  Evolocumab  (REPATHA  SURECLICK) 140 MG/ML SOAJ INJECT 1 PEN SUBCUTANEOUSLY  EVERY 2 WEEKS 05/01/24   Croitoru, Mihai, MD  famotidine  (PEPCID ) 20 MG tablet Take 1 tablet (20 mg total) by mouth 2 (two) times daily. 05/04/24   Soldatova, Liuba, MD  FLUoxetine  (PROZAC ) 40 MG capsule Take 1 capsule (40 mg total) by mouth in the morning. 08/26/23     fluticasone  (FLONASE ) 50 MCG/ACT nasal spray Place 2 sprays into both nostrils daily. 11/30/23   Soldatova, Liuba, MD  glimepiride  (AMARYL ) 1 MG tablet TAKE 1 TABLET BY MOUTH DAILY  WITH BREAKFAST 12/13/23   Plotnikov, Aleksei V, MD  glucose blood (ACCU-CHEK GUIDE) test strip Use to check blood sugar up to four times daily. 03/12/22   Samtani, Jai-Gurmukh, MD  ketoconazole  (  NIZORAL ) 2 % cream Apply 1 Application topically daily. 04/04/23   Plotnikov, Aleksei V, MD  lanthanum (FOSRENOL) 1000 MG chewable tablet Chew 1,000-2,000 mg by mouth See admin instructions. Taking 2000 mg with meals and 1000 mg  with snacks    [provider]  lidocaine  (LIDODERM ) 5 % PLACE 2 PATCHES ONTO THE SKIN  DAILY 08/13/22   Plotnikov, Aleksei V, MD  lidocaine  (XYLOCAINE ) 5 % ointment Apply 1 Application topically 3 (three) times daily as needed for moderate pain. 08/22/23   Merlynn Niki FALCON, FNP  LORazepam  (ATIVAN ) 2 MG tablet Take 1 tablet (2 mg total) by mouth 2 (two) times daily as needed for anxiety. 08/26/23     LORazepam  (ATIVAN ) 2 MG tablet Take 1 tablet (2 mg total) by mouth 2 (two) times daily as needed for anxiety. 03/14/24     midodrine  (PROAMATINE ) 10 MG tablet Take 3 tablets (30 mg total) by mouth 3 (three) times a week. Take 30 minutes before dialysis and extra tablet during dialysis. Patient taking differently: Take 20 mg by mouth 3 (three) times a week. Take 30 minutes before dialysis and extra tablet during dialysis. 11/21/23    Marlee Bernardino NOVAK, MD  multivitamin-lutein (OCUVITE-LUTEIN) CAPS capsule Take 1 capsule by mouth every morning.    [provider]  naloxone  (NARCAN ) nasal spray 4 mg/0.1 mL Take by nasal route every 3 minutes until patient awakes or EMS arrives. 01/20/22     ondansetron  (ZOFRAN ) 4 MG tablet Take 1 tablet (4 mg total) by mouth every 8 (eight) hours as needed for nausea or vomiting. 06/03/22   Shuford, Randine, PA-C  oxyCODONE  (ROXICODONE ) 15 MG immediate release tablet Take 1 tablet (15 mg total) by mouth 4 (four) times daily as needed. 02/22/24     pantoprazole  (PROTONIX ) 40 MG tablet TAKE 1 TABLET BY MOUTH TWICE  DAILY 01/05/24   Abran Norleen SAILOR, MD  pilocarpine  (SALAGEN ) 5 MG tablet TAKE 1 TABLET BY MOUTH TWICE  DAILY 05/24/24   Plotnikov, Aleksei V, MD  pioglitazone  (ACTOS ) 15 MG tablet TAKE 1 TABLET BY MOUTH DAILY 02/21/24   Plotnikov, Aleksei V, MD  polyethylene glycol (MIRALAX  / GLYCOLAX ) 17 g packet Take 17 g by mouth daily as needed for moderate constipation.    [provider]  PREVIDENT 5000 DRY MOUTH 1.1 % GEL dental gel Place 1 application  onto teeth in the morning and at bedtime. 10/25/21   [provider]  Semaglutide ,0.25 or 0.5MG /DOS, (OZEMPIC , 0.25 OR 0.5 MG/DOSE,) 2 MG/3ML SOPN INJECT SUBCUTANEOUSLY 0.5 MG  EVERY WEEK 05/20/24   Plotnikov, Aleksei V, MD  sodium chloride  (OCEAN) 0.65 % SOLN nasal spray Place 1 spray into both nostrils as needed. 05/04/24   Okey Burns, MD    Social History   Socioeconomic History   Marital status: Single    Spouse name: Not on file   Number of children: 0   Years of education: Not on file   Highest education level: Not on file  Occupational History   Not on file  Tobacco Use   Smoking status: Former    Current packs/day: 0.00    Average packs/day: 1 pack/day for 20.0 years (20.0 ttl pk-yrs)    Types: Cigarettes    Start date: 12/13/1968    Quit date: 12/13/1988    Years since quitting: 35.7   Smokeless tobacco: Never   Vaping Use   Vaping status: Never Used  Substance and Sexual Activity   Alcohol  use: Not Currently  Alcohol /week: 0.0 standard drinks of alcohol     Comment: rarely   Drug use: No   Sexual activity: Not Currently  Other Topics Concern   Not on file  Social History Narrative   Are you right handed or left handed? Right   Are you currently employed ?    What is your current occupation? Retired    Do you live at home alone?yes   Who lives with you?    What type of home do you live in: 1 story or 2 story? Apartment with Magazine features editor none   Social Drivers of Health   Financial Resource Strain: Low Risk  (08/17/2024)   Overall Financial Resource Strain (CARDIA)    Difficulty of Paying Living Expenses: Not hard at all  Food Insecurity: No Food Insecurity (08/17/2024)   Hunger Vital Sign    Worried About Running Out of Food in the Last Year: Never true    Ran Out of Food in the Last Year: Never true  Transportation Needs: No Transportation Needs (08/17/2024)   PRAPARE - Administrator, Civil Service (Medical): No    Lack of Transportation (Non-Medical): No  Physical Activity: Inactive (08/17/2024)   Exercise Vital Sign    Days of Exercise per Week: 0 days    Minutes of Exercise per Session: 0 min  Stress: No Stress Concern Present (08/17/2024)   [employer] of Occupational Health - Occupational Stress Questionnaire    Feeling of Stress: Not at all  Social Connections: Socially Isolated (08/17/2024)   Social Connection and Isolation Panel    Frequency of Communication with Friends and Family: More than three times a week    Frequency of Social Gatherings with Friends and Family: Twice a week    Attends Religious Services: Never    Database administrator or Organizations: No    Attends Banker Meetings: Never    Marital Status: Never married  Intimate Partner Violence: Not At Risk (08/17/2024)   Humiliation, Afraid, Rape, and Kick questionnaire     Fear of Current or Ex-Partner: No    Emotionally Abused: No    Physically Abused: No    Sexually Abused: No   Family History  Problem Relation Age of Onset   Depression Mother    Hypertension Mother    Stroke Mother 51   Diabetes Mother    Diabetes Father    Hyperlipidemia Father    Hypertension Father    Crohn's disease Maternal Uncle    Diabetes Brother    Heart disease Brother    Hyperlipidemia Brother    Hypertension Brother    Prostate cancer Brother    Diabetes Brother    Heart disease Brother        Heart Disease before age 59   Heart attack Brother    Stroke Brother        X's 2   Hyperlipidemia Brother    Hypertension Brother    Diabetes Other    Colon cancer Neg Hx    Esophageal cancer Neg Hx    Stomach cancer Neg Hx    Pancreatic cancer Neg Hx    Breast cancer Neg Hx    BRCA 1/2 Neg Hx     ROS: Otherwise negative unless mentioned in HPI  Physical Examination  Vitals:   09/07/24 0849 09/07/24 0857  BP: (!) 127/51 (!) 127/51  Pulse: 82 85  Resp: 12 14  Temp: 98.2 F (36.8 C)   SpO2:  96% 96%   There is no height or weight on file to calculate BMI.  General:  WDWN in NAD Gait: Not observed HENT: WNL, normocephalic Pulmonary: normal non-labored breathing, without Rales, rhonchi,  wheezing Cardiac: regular, without  Murmurs, rubs or gallops;  Abdomen:  soft, NT/ND, no masses Skin: without rashes Vascular Exam/Pulses: 2+ Extremities: without ischemic changes, without Gangrene , without cellulitis; without open wounds;  Musculoskeletal: no muscle wasting or atrophy  Neurologic: A&O X 3;  No focal weakness or paresthesias are detected; speech is fluent/normal Psychiatric:  The pt has Normal affect. Lymph:  Unremarkable  CBC    Component Value Date/Time   WBC 12.3 (H) 03/09/2023 1706   RBC 3.42 (L) 03/09/2023 1706   HGB 11.2 (L) 03/09/2023 1706   HCT 34.3 (L) 03/09/2023 1706   PLT 271.0 03/09/2023 1706   MCV 100.4 (H) 03/09/2023 1706   MCH  32.7 06/03/2022 0610   MCHC 32.6 03/09/2023 1706   RDW 13.8 03/09/2023 1706   LYMPHSABS 2.9 03/09/2023 1706   MONOABS 0.8 03/09/2023 1706   EOSABS 0.5 03/09/2023 1706   BASOSABS 0.1 03/09/2023 1706    BMET    Component Value Date/Time   NA 136 08/22/2023 1220   K 5.3 (H) 08/22/2023 1220   CL 90 (L) 08/22/2023 1220   CO2 33 (H) 08/22/2023 1220   GLUCOSE 148 (H) 08/22/2023 1220   BUN 49 (H) 08/22/2023 1220   CREATININE 5.83 (HH) 08/22/2023 1220   CALCIUM 9.7 08/22/2023 1220   GFRNONAA 14 (L) 06/03/2022 0610   GFRAA 66 (L) 03/07/2015 0449    COAGS: Lab Results  Component Value Date   INR 0.9 03/09/2023   INR 1.1 03/10/2022   INR 1.1 08/06/2021       ASSESSMENT/PLAN: This is a 73 y.o. female with history of right arm brachial basilic fistula and history of balloon venoplasty of the innominate vein.  She presents with prolonged bleeding.  On physical exam, there is an excellent thrill within the fistula.  No pulsatility.  She is aware that this could be diagnostic.  After discussing the risks and benefits of left arm fistulogram in an effort to define and treat the possible central stenosis leading to prolonged bleeding after decannulation, [name] elected to proceed    Anita Meacham E Jimesha Rising MD [facility] [phone] 09/07/2024  9:56 AM

## 2024-09-07 NOTE — Op Note (Signed)
    NAME: [name]    MRN: [id number] DOB: 1951/08/07    DATE OF OPERATION: 09/07/2024  PREOP DIAGNOSIS:    Fistula malfunction  POSTOP DIAGNOSIS:    Same  PROCEDURE:    Right arm fistulogram Right subclavian vein, innominate vein balloon venoplasty 12 x 40 mm  SURGEON: Fonda FORBES Rim  ASSIST: None  ANESTHESIA: None  EBL: 2ml  INDICATIONS:    [name] is a 73 y.o. female presenting after prolonged bleeding at dialysis.  After discussing the risks and benefits of fistulogram to identify, and possibly improve central stenosis to decrease risk of post cannulation bleeding, Meaders elected to proceed.  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  Lidocaine  was used to anesthetize the arm, and the fistula was accessed using a micropuncture needle.  This was exchanged over wire to a micropuncture sheath and fistulogram followed.  See results above.  I elected to intervene on the innominate vein.  The micropuncture sheath was exchanged for a 7 French sheath and an 8 x 40 mm balloon was brought into the field and laid across the lesion and inflated.  This was followed by a 12 x 40 mm balloon which was inflated for 2 minutes.  Follow-up angiography demonstrated an excellent result with residual stenosis 30%.  This area is not amenable to stenting.  The fistula remains amenable to future intervention The fistula can continue to be accessed  Fonda FORBES Rim, MD [facility] DATE OF DICTATION:   09/07/2024

## 2024-09-08 DIAGNOSIS — N2581 Secondary hyperparathyroidism of renal origin: Secondary | ICD-10-CM | POA: Diagnosis not present

## 2024-09-08 DIAGNOSIS — Z992 Dependence on renal dialysis: Secondary | ICD-10-CM | POA: Diagnosis not present

## 2024-09-08 DIAGNOSIS — D509 Iron deficiency anemia, unspecified: Secondary | ICD-10-CM | POA: Diagnosis not present

## 2024-09-08 DIAGNOSIS — N186 End stage renal disease: Secondary | ICD-10-CM | POA: Diagnosis not present

## 2024-09-08 DIAGNOSIS — D631 Anemia in chronic kidney disease: Secondary | ICD-10-CM | POA: Diagnosis not present

## 2024-09-10 ENCOUNTER — Encounter (HOSPITAL_COMMUNITY): Payer: Self-pay | Admitting: Vascular Surgery

## 2024-09-11 ENCOUNTER — Encounter (HOSPITAL_COMMUNITY): Payer: Self-pay | Admitting: Vascular Surgery

## 2024-09-11 DIAGNOSIS — Z992 Dependence on renal dialysis: Secondary | ICD-10-CM | POA: Diagnosis not present

## 2024-09-11 DIAGNOSIS — N186 End stage renal disease: Secondary | ICD-10-CM | POA: Diagnosis not present

## 2024-09-11 DIAGNOSIS — N2581 Secondary hyperparathyroidism of renal origin: Secondary | ICD-10-CM | POA: Diagnosis not present

## 2024-09-11 DIAGNOSIS — D631 Anemia in chronic kidney disease: Secondary | ICD-10-CM | POA: Diagnosis not present

## 2024-09-11 DIAGNOSIS — E1022 Type 1 diabetes mellitus with diabetic chronic kidney disease: Secondary | ICD-10-CM | POA: Diagnosis not present

## 2024-09-11 DIAGNOSIS — D509 Iron deficiency anemia, unspecified: Secondary | ICD-10-CM | POA: Diagnosis not present

## 2024-09-12 ENCOUNTER — Ambulatory Visit: Admitting: Internal Medicine

## 2024-09-12 ENCOUNTER — Encounter: Payer: Self-pay | Admitting: Internal Medicine

## 2024-09-12 VITALS — BP 126/78 | HR 90 | Temp 97.9°F | Ht 62.5 in | Wt 189.4 lb

## 2024-09-12 DIAGNOSIS — M8580 Other specified disorders of bone density and structure, unspecified site: Secondary | ICD-10-CM

## 2024-09-12 DIAGNOSIS — F419 Anxiety disorder, unspecified: Secondary | ICD-10-CM | POA: Diagnosis not present

## 2024-09-12 DIAGNOSIS — E1129 Type 2 diabetes mellitus with other diabetic kidney complication: Secondary | ICD-10-CM

## 2024-09-12 DIAGNOSIS — R296 Repeated falls: Secondary | ICD-10-CM

## 2024-09-12 DIAGNOSIS — Z992 Dependence on renal dialysis: Secondary | ICD-10-CM | POA: Diagnosis not present

## 2024-09-12 DIAGNOSIS — E1142 Type 2 diabetes mellitus with diabetic polyneuropathy: Secondary | ICD-10-CM

## 2024-09-12 DIAGNOSIS — I10 Essential (primary) hypertension: Secondary | ICD-10-CM

## 2024-09-12 DIAGNOSIS — E1122 Type 2 diabetes mellitus with diabetic chronic kidney disease: Secondary | ICD-10-CM | POA: Diagnosis not present

## 2024-09-12 DIAGNOSIS — N186 End stage renal disease: Secondary | ICD-10-CM | POA: Diagnosis not present

## 2024-09-12 LAB — POCT GLYCOSYLATED HEMOGLOBIN (HGB A1C): Hemoglobin A1C: 6.1 % — AB (ref 4.0–5.6)

## 2024-09-12 MED ORDER — CYCLOBENZAPRINE HCL 10 MG PO TABS: 10.0000 mg | ORAL_TABLET | Freq: Three times a day (TID) | ORAL | 0 refills | Status: AC | PRN

## 2024-09-12 NOTE — Assessment & Plan Note (Signed)
Apple watch

## 2024-09-12 NOTE — Assessment & Plan Note (Signed)
 F/u appt w/Dr Melanee (Endocrinology)

## 2024-09-12 NOTE — Progress Notes (Signed)
 Subjective:  Patient ID: [name], female    DOB: December 16, 1950  Age: 73 y.o. MRN: [id number]  CC: Medical Management of Chronic Issues (3 Month follow up)   HPI [name] presents for OA, osteoporosis, ESRD on HD, LBP  Outpatient Medications Prior to Visit  Medication Sig Dispense Refill   Accu-Chek Softclix Lancets lancets Use 1 lancet up to 4 times daily as directed 100 each 0   acetaminophen  (TYLENOL ) 500 MG tablet Take 1,000 mg by mouth 2 (two) times daily as needed for headache.     aspirin  EC 81 MG tablet Take 1 tablet (81 mg total) by mouth daily.     aspirin -acetaminophen -caffeine (EXCEDRIN MIGRAINE) 250-250-65 MG tablet Take 1 tablet by mouth daily as needed for headache or migraine. Max 3 doses in one week     azelastine  (ASTELIN ) 0.1 % nasal spray Place 2 sprays into both nostrils 2 (two) times daily. Use in each nostril as directed 30 mL 12   B Complex-C-Folic Acid  (RENA-VITE PO) Take 1 tablet by mouth every morning.     blood glucose meter kit and supplies Dispense based on patient and insurance preference. Used to check blood sugar daily, DX: E11.9 OneTouch 1 each 0   Blood Glucose Monitoring Suppl (ACCU-CHEK GUIDE ME) w/Device KIT Use as directed to check blood sugar four times daily. 1 kit 0   buPROPion  (WELLBUTRIN  XL) 300 MG 24 hr tablet Take 1 tablet (300 mg total) by mouth in the morning. 90 tablet 1   carboxymethylcellulose (REFRESH PLUS) 0.5 % SOLN Place 1 drop into both eyes 3 (three) times daily as needed (dry eyes/irritation).     cholecalciferol (VITAMIN D3) 25 MCG (1000 UNIT) tablet Take by mouth daily.     cinacalcet  (SENSIPAR ) 30 MG tablet Take 30 mg by mouth Every Tuesday,Thursday,and Saturday with dialysis.     diphenhydramine -acetaminophen  (TYLENOL  PM) 25-500 MG TABS tablet Take 2 tablets by mouth at bedtime as needed (sleep).     docusate sodium  (COLACE) 50 MG capsule Take 50 mg by mouth 2 (two) times daily.     doxycycline  (VIBRA -TABS) 100 MG tablet  Take 1 tablet (100 mg total) by mouth 2 (two) times daily. 10 tablet 0   erythromycin  ophthalmic ointment Apply to stye 4 times daily as directed. 3.5 g 0   Evolocumab  (REPATHA  SURECLICK) 140 MG/ML SOAJ INJECT 1 PEN SUBCUTANEOUSLY  EVERY 2 WEEKS 2 mL 5   famotidine  (PEPCID ) 20 MG tablet Take 1 tablet (20 mg total) by mouth 2 (two) times daily. 30 tablet 3   FLUoxetine  (PROZAC ) 40 MG capsule Take 1 capsule (40 mg total) by mouth in the morning. 90 capsule 1   fluticasone  (FLONASE ) 50 MCG/ACT nasal spray Place 2 sprays into both nostrils daily. 16 g 6   glimepiride  (AMARYL ) 1 MG tablet TAKE 1 TABLET BY MOUTH DAILY  WITH BREAKFAST 100 tablet 2   glucose blood (ACCU-CHEK GUIDE) test strip Use to check blood sugar up to four times daily. 50 each 0   ketoconazole  (NIZORAL ) 2 % cream Apply 1 Application topically daily. 60 g 3   lanthanum (FOSRENOL) 1000 MG chewable tablet Chew 1,000-2,000 mg by mouth See admin instructions. Taking 2000 mg with meals and 1000 mg  with snacks     lidocaine  (LIDODERM ) 5 % PLACE 2 PATCHES ONTO THE SKIN  DAILY 120 patch 1   lidocaine  (XYLOCAINE ) 5 % ointment Apply 1 Application topically 3 (three) times daily as needed for moderate pain.  90 g 0   LORazepam  (ATIVAN ) 2 MG tablet Take 1 tablet (2 mg total) by mouth 2 (two) times daily as needed for anxiety. 60 tablet 5   LORazepam  (ATIVAN ) 2 MG tablet Take 1 tablet (2 mg total) by mouth 2 (two) times daily as needed for anxiety. 60 tablet 5   midodrine  (PROAMATINE ) 10 MG tablet Take 3 tablets (30 mg total) by mouth 3 (three) times a week. Take 30 minutes before dialysis and extra tablet during dialysis. (Patient taking differently: Take 20 mg by mouth 3 (three) times a week. Take 30 minutes before dialysis and extra tablet during dialysis.) 279 tablet 3   multivitamin-lutein (OCUVITE-LUTEIN) CAPS capsule Take 1 capsule by mouth every morning.     naloxone  (NARCAN ) nasal spray 4 mg/0.1 mL Take by nasal route every 3 minutes until  patient awakes or EMS arrives. 2 each 0   ondansetron  (ZOFRAN ) 4 MG tablet Take 1 tablet (4 mg total) by mouth every 8 (eight) hours as needed for nausea or vomiting. 10 tablet 0   oxyCODONE  (ROXICODONE ) 15 MG immediate release tablet Take 1 tablet (15 mg total) by mouth 4 (four) times daily as needed. 120 tablet 0   pantoprazole  (PROTONIX ) 40 MG tablet TAKE 1 TABLET BY MOUTH TWICE  DAILY 200 tablet 2   pilocarpine  (SALAGEN ) 5 MG tablet TAKE 1 TABLET BY MOUTH TWICE  DAILY 60 tablet 11   pioglitazone  (ACTOS ) 15 MG tablet TAKE 1 TABLET BY MOUTH DAILY 100 tablet 2   polyethylene glycol (MIRALAX  / GLYCOLAX ) 17 g packet Take 17 g by mouth daily as needed for moderate constipation.     PREVIDENT 5000 DRY MOUTH 1.1 % GEL dental gel Place 1 application  onto teeth in the morning and at bedtime.     Semaglutide ,0.25 or 0.5MG /DOS, (OZEMPIC , 0.25 OR 0.5 MG/DOSE,) 2 MG/3ML SOPN INJECT SUBCUTANEOUSLY 0.5 MG  EVERY WEEK 9 mL 1   sodium chloride  (OCEAN) 0.65 % SOLN nasal spray Place 1 spray into both nostrils as needed. 30 mL 5   cyclobenzaprine  (FLEXERIL ) 10 MG tablet Take 1 tablet (10 mg total) by mouth 3 (three) times daily as needed for muscle spasms. 270 tablet 0   No facility-administered medications prior to visit.    ROS: Review of Systems  Constitutional:  Positive for fatigue. Negative for activity change, appetite change, chills and unexpected weight change.  HENT:  Negative for congestion, mouth sores and sinus pressure.   Eyes:  Negative for visual disturbance.  Respiratory:  Negative for cough and chest tightness.   Cardiovascular:  Negative for leg swelling.  Gastrointestinal:  Negative for abdominal pain and nausea.  Genitourinary:  Negative for difficulty urinating, frequency and vaginal pain.  Musculoskeletal:  Positive for arthralgias, back pain, gait problem, neck pain and neck stiffness.  Skin:  Negative for pallor and rash.  Neurological:  Negative for dizziness, tremors, weakness,  numbness and headaches.  Hematological:  Bruises/bleeds easily.  Psychiatric/Behavioral:  Positive for decreased concentration. Negative for confusion, sleep disturbance and suicidal ideas. The patient is nervous/anxious.     Objective:  BP 126/78   Pulse 90   Temp 97.9 F (36.6 C)   Ht 5' 2.5 (1.588 m)   Wt 189 lb 6.4 oz (85.9 kg)   SpO2 99%   BMI 34.09 kg/m   BP Readings from Last 3 Encounters:  09/12/24 126/78  09/07/24 (!) 102/45  06/13/24 110/60    Wt Readings from Last 3 Encounters:  09/12/24 189 lb 6.4  oz (85.9 kg)  08/17/24 185 lb 3.2 oz (84 kg)  06/13/24 191 lb (86.6 kg)    Physical Exam Constitutional:      General: She is not in acute distress.    Appearance: She is well-developed. She is obese.  HENT:     Head: Normocephalic.     Right Ear: External ear normal.     Left Ear: External ear normal.     Nose: Nose normal.  Eyes:     General:        Right eye: No discharge.        Left eye: No discharge.     Conjunctiva/sclera: Conjunctivae normal.     Pupils: Pupils are equal, round, and reactive to light.  Neck:     Thyroid : No thyromegaly.     Vascular: No JVD.     Trachea: No tracheal deviation.  Cardiovascular:     Rate and Rhythm: Normal rate and regular rhythm.     Heart sounds: Normal heart sounds.  Pulmonary:     Effort: No respiratory distress.     Breath sounds: No stridor. No wheezing.  Abdominal:     General: Bowel sounds are normal. There is no distension.     Palpations: Abdomen is soft. There is no mass.     Tenderness: There is no abdominal tenderness. There is no guarding or rebound.  Musculoskeletal:        General: Tenderness present.     Cervical back: Normal range of motion and neck supple. No rigidity.     Right lower leg: No edema.     Left lower leg: No edema.  Lymphadenopathy:     Cervical: No cervical adenopathy.  Skin:    Findings: No erythema or rash.  Neurological:     Mental Status: She is oriented to person,  place, and time.     Cranial Nerves: No cranial nerve deficit.     Motor: No weakness or abnormal muscle tone.     Coordination: Coordination normal.     Gait: Gait abnormal.     Deep Tendon Reflexes: Reflexes normal.  Psychiatric:        Behavior: Behavior normal.        Thought Content: Thought content normal.        Judgment: Judgment normal.   Antalgic gait, cane LS, neck stiff  Lab Results  Component Value Date   WBC 12.3 (H) 03/09/2023   HGB 11.2 (L) 03/09/2023   HCT 34.3 (L) 03/09/2023   PLT 271.0 03/09/2023   GLUCOSE 148 (H) 08/22/2023   CHOL 129 08/22/2023   TRIG 207.0 (H) 08/22/2023   HDL 50.60 08/22/2023   LDLCALC 37 08/22/2023   ALT 13 08/22/2023   AST 16 08/22/2023   NA 136 08/22/2023   K 5.3 (H) 08/22/2023   CL 90 (L) 08/22/2023   CREATININE 5.83 (HH) 08/22/2023   BUN 49 (H) 08/22/2023   CO2 33 (H) 08/22/2023   TSH 3.41 08/22/2023   INR 0.9 03/09/2023   HGBA1C 6.3 (A) 06/13/2024    PERIPHERAL VASCULAR CATHETERIZATION Result Date: 09/07/2024 Table formatting from the original result was not included. Images from the original result were not included. NAME: MAGHAN JESSEE    MRN: [id number] DOB: 03-08-1951    DATE OF OPERATION: 09/07/2024 PREOP DIAGNOSIS:  Fistula malfunction POSTOP DIAGNOSIS:  Same PROCEDURE:  Right arm fistulogram Right subclavian vein, innominate vein balloon venoplasty 12 x 40 mm SURGEON: Fonda FORBES Rim ASSIST: None ANESTHESIA: None  EBL: 2ml INDICATIONS:  SUZAN MANON is a 73 y.o. female presenting after prolonged bleeding at dialysis.  After discussing the risks and benefits of fistulogram to identify, and possibly improve central stenosis to decrease risk of post cannulation bleeding, Tiley elected to proceed. 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  Lidocaine  was used to  anesthetize the arm, and the fistula was accessed using a micropuncture needle.  This was exchanged over wire to a micropuncture sheath and fistulogram followed.  See results above.  I elected to intervene on the innominate vein.  The micropuncture sheath was exchanged for a 7 French sheath and an 8 x 40 mm balloon was brought into the field and laid across the lesion and inflated.  This was followed by a 12 x 40 mm balloon which was inflated for 2 minutes.  Follow-up angiography demonstrated an excellent result with residual stenosis 30%.  This area is not amenable to stenting. The fistula remains amenable to future intervention The fistula can continue to be accessed Fonda FORBES Rim, MD [facility] DATE OF DICTATION:   09/07/2024    Assessment & Plan:   Problem List Items Addressed This Visit     Anxiety disorder   On Lorazepam  prn      Diabetic peripheral neuropathy ([name])   Falls prevention was discussed      Relevant Medications   cyclobenzaprine  (FLEXERIL ) 10 MG tablet   ESRD on hemodialysis ([name])   On HD      Essential hypertension   Low BP - on Midodrin on HD days      Falls frequently   Apple watch      Osteopenia   F/u appt w/Dr Melanee (Endocrinology)      Type 2 diabetes mellitus with other diabetic kidney complication ([name]) - Primary   POC - per Nephrology On Ozempic  0.5 mg/wk         Meds ordered this encounter  Medications   cyclobenzaprine  (FLEXERIL ) 10 MG tablet    Sig: Take 1 tablet (10 mg total) by mouth 3 (three) times daily as needed for muscle spasms.    Dispense:  270 tablet    Refill:  0      Follow-up: Return in about 6 months (around 03/13/2025) for a follow-up visit.  Marolyn Noel, MD

## 2024-09-12 NOTE — Assessment & Plan Note (Signed)
 POC - per Nephrology On Ozempic  0.5 mg/wk

## 2024-09-12 NOTE — Assessment & Plan Note (Signed)
On Lorazepam prn 

## 2024-09-12 NOTE — Assessment & Plan Note (Signed)
 On HD

## 2024-09-12 NOTE — Addendum Note (Signed)
 Addended byBETHA LUCETTA CLEATRICE LELON on: 09/12/2024 04:10 PM   Modules accepted: Orders

## 2024-09-12 NOTE — Assessment & Plan Note (Signed)
Low BP - on Midodrin on HD days

## 2024-09-12 NOTE — Assessment & Plan Note (Signed)
 Falls prevention was discussed

## 2024-09-14 NOTE — Progress Notes (Signed)
 I saw [name] in neurology clinic on 09/27/24 in follow up for imbalance and falls.  HPI: [name] is a 73 y.o. year old female with a history of DM2, ependymoma s/p resection, HTN, fibromyalgia, ESRD on dialysis, OA, anxiety who we last saw on 03/28/24.  To briefly review: 03/28/24: Patient is having a lot falls. This started in 2016, but has gotten worse over the last few years. She has fallen twice in 2025. She has also had multiple injuries. Per [facility] note (media from 02/27/24) patient has hx of fracture of right lateral humeral epicondyle and right ulnar neuropathy at the elbow - given a cubital tunnel splint.    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 This started with numbness in her toes. It is now in both feet. She typically does not have pain, but occasionally has tingling and burning at night. She will take oxycodone  for this. She has tried gabapentin but she had hallucinations on it. She mentions she tore all the ligaments in her left foot. She has not had any surgical repair on this. She feels this is contributing to imbalance as well.   She endorses dry eyes and mouth. She denies difficulty sweating, gastroparetic early satiety, postprandial abdominal bloating, constipation, bowel or bladder dyscontrol.   She does not report any constitutional symptoms like fever, night sweats, anorexia or unintentional weight loss.   She has lightheadedness that also contributes. She notices this when standing. It improves with sitting. She has midodrine  that she takes on the day she takes dialysis. She had syncopal episodes, usually on days after dialysis.   Of note, patient takes vitamins due to dialysis, one of which is [place]-[employer]ite that has 500% of daily B6.   Patient has another MRI brain scheduled for 04/23/24 to re-evaluate ependymoma.   Patient just finished PT at the beginning of 03/2024. She has been fitted for an orthotic on the  left.   EtOH use: None  Restrictive diet? No Family history of neuropathy/myopathy/neurologic disease? Mother and her siblings had neuropathy (cause unknown, but did have back issues); brother with strokes  Most recent Assessment and Plan (03/28/24): [name] is a 73 y.o. female who presents for evaluation of imbalance and falls. She has a relevant medical history of DM2, ependymoma s/p resection, HTN, fibromyalgia, ESRD on dialysis, OA, anxiety. Her neurological examination is pertinent for diminished sensation in bilateral lower extremities in length dependent fashion. Available diagnostic data is significant for HbA1c of 6.5, BMP showing expected abnormalities related to ESRD, and MRI left ankle showing multiple abnormalities of the joint and tendons. 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 I will send labs to look for treatable causes of neuropathy. Patient has an upcoming MRI brain to ensure there is no change from prior ependymoma resection, which is reasonable.   PLAN: -Blood work: B6, B12, IFE -Will follow up on MRI brain in 04/2024 -Continue home PT exercises -Agree with orthotic for left foot -Continue lidocaine  ointment as needed -Would try to avoid opioids if able as this will contribute to imbalance  Since their last visit: Labs were unremarkable (mildly elevated B6). 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 There was also concern for bone marrow signal change in cervical spine, so MRI cervical spine was  recommended. This showed multilevel, up to severe foraminal stenosis, but without significant central canal stenosis. Marrow signal seen on MRI brain was felt to be consistent with degenerative changes.  Patient was told to consider aspirin  but did  not start it. She states she gets covered in bruises.  She is on Repatha  for HLD.  She still has imbalance but has not fallen in the last 6 months. She uses a walking stick always.  She does endorse some neck pain, sometimes radiating into either arm, but more into her shoulders. She denies significant numbness or tingling in her arms.  She continues to have numbness and tingling in her legs. She has pain in her big toe. She will take oxy if needed. She has previously tried gabapentin and lyrica and was not able to tolerate it.  She has no new complaints today.  MEDICATIONS:  Outpatient Encounter Medications as of 09/27/2024  Medication Sig Note   acetaminophen  (TYLENOL ) 500 MG tablet Take 1,000 mg by mouth 2 (two) times daily as needed for headache.    B Complex-C-Folic Acid  (RENA-VITE PO) Take 1 tablet by mouth every morning.    buPROPion  (WELLBUTRIN  XL) 300 MG 24 hr tablet Take 1 tablet (300 mg total) by mouth in the morning.    carboxymethylcellulose (REFRESH PLUS) 0.5 % SOLN Place 1 drop into both eyes 3 (three) times daily as needed (dry eyes/irritation).    cholecalciferol (VITAMIN D3) 25 MCG (1000 UNIT) tablet Take by mouth daily. (Patient taking differently: Take by mouth daily. 3 times a week)    cinacalcet  (SENSIPAR ) 30 MG tablet Take 30 mg by mouth Every Tuesday,Thursday,and Saturday with dialysis.    cyclobenzaprine  (FLEXERIL ) 10 MG tablet Take 1 tablet (10 mg total) by mouth 3 (three) times daily as needed for muscle spasms.    diphenhydramine -acetaminophen  (TYLENOL  PM) 25-500 MG TABS tablet Take 2 tablets by mouth at bedtime as needed (sleep).    docusate sodium  (COLACE) 50 MG capsule Take 50 mg by mouth 2 (two) times daily.    Evolocumab  (REPATHA  SURECLICK) 140 MG/ML SOAJ INJECT 1 PEN SUBCUTANEOUSLY  EVERY 2 WEEKS    FLUoxetine  (PROZAC ) 40 MG capsule Take 1 capsule (40 mg total) by mouth in the morning.    glimepiride  (AMARYL ) 1 MG tablet TAKE 1 TABLET BY MOUTH DAILY  WITH  BREAKFAST    ketoconazole  (NIZORAL ) 2 % cream Apply 1 Application topically daily.    lanthanum (FOSRENOL) 1000 MG chewable tablet Chew 1,000-2,000 mg by mouth See admin instructions. Taking 2000 mg with meals and 1000 mg  with snacks    lidocaine  (XYLOCAINE ) 5 % ointment Apply 1 Application topically 3 (three) times daily as needed for moderate pain.    LORazepam  (ATIVAN ) 2 MG tablet Take 1 tablet (2 mg total) by mouth 2 (two) times daily as needed for anxiety.    midodrine  (PROAMATINE ) 10 MG tablet Take 3 tablets (30 mg total) by mouth 3 (three) times a week. Take 30 minutes before dialysis and extra tablet during dialysis.    multivitamin-lutein (OCUVITE-LUTEIN) CAPS capsule Take 1 capsule by mouth every morning.    oxyCODONE  (ROXICODONE ) 15 MG immediate release tablet Take 1 tablet (15 mg total) by mouth 4 (four) times daily as needed.    pantoprazole  (PROTONIX ) 40 MG tablet TAKE 1 TABLET BY MOUTH TWICE  DAILY    pilocarpine  (SALAGEN ) 5 MG tablet TAKE 1 TABLET BY MOUTH TWICE  DAILY    pioglitazone  (ACTOS ) 15 MG tablet TAKE 1 TABLET BY  MOUTH DAILY    polyethylene glycol (MIRALAX  / GLYCOLAX ) 17 g packet Take 17 g by mouth daily as needed for moderate constipation.    PREVIDENT 5000 DRY MOUTH 1.1 % GEL dental gel Place 1 application  onto teeth in the morning and at bedtime.    Semaglutide ,0.25 or 0.5MG /DOS, (OZEMPIC , 0.25 OR 0.5 MG/DOSE,) 2 MG/3ML SOPN INJECT SUBCUTANEOUSLY 0.5 MG  EVERY WEEK    sodium chloride  (OCEAN) 0.65 % SOLN nasal spray Place 1 spray into both nostrils as needed.    Accu-Chek Softclix Lancets lancets Use 1 lancet up to 4 times daily as directed (Patient not taking: Reported on 09/27/2024)    aspirin -acetaminophen -caffeine (EXCEDRIN MIGRAINE) 250-250-65 MG tablet Take 1 tablet by mouth daily as needed for headache or migraine. Max 3 doses in one week (Patient not taking: Reported on 09/27/2024)    azelastine  (ASTELIN ) 0.1 % nasal spray Place 2 sprays into both nostrils 2  (two) times daily. Use in each nostril as directed (Patient not taking: Reported on 09/27/2024)    blood glucose meter kit and supplies Dispense based on patient and insurance preference. Used to check blood sugar daily, DX: E11.9 OneTouch (Patient not taking: Reported on 09/27/2024)    Blood Glucose Monitoring Suppl (ACCU-CHEK GUIDE ME) w/Device KIT Use as directed to check blood sugar four times daily. (Patient not taking: Reported on 09/27/2024)    fluticasone  (FLONASE ) 50 MCG/ACT nasal spray Place 2 sprays into both nostrils daily. (Patient not taking: Reported on 09/27/2024)    glucose blood (ACCU-CHEK GUIDE) test strip Use to check blood sugar up to four times daily. (Patient not taking: Reported on 09/27/2024)    lidocaine  (LIDODERM ) 5 % PLACE 2 PATCHES ONTO THE SKIN  DAILY (Patient not taking: Reported on 09/27/2024)    naloxone  (NARCAN ) nasal spray 4 mg/0.1 mL Take by nasal route every 3 minutes until patient awakes or EMS arrives. (Patient not taking: Reported on 09/27/2024) 03/10/2022: Hasn't needed   ondansetron  (ZOFRAN ) 4 MG tablet Take 1 tablet (4 mg total) by mouth every 8 (eight) hours as needed for nausea or vomiting. (Patient not taking: Reported on 09/27/2024)    [DISCONTINUED] aspirin  EC 81 MG tablet Take 1 tablet (81 mg total) by mouth daily.    [DISCONTINUED] doxycycline  (VIBRA -TABS) 100 MG tablet Take 1 tablet (100 mg total) by mouth 2 (two) times daily.    [DISCONTINUED] erythromycin  ophthalmic ointment Apply to stye 4 times daily as directed.    [DISCONTINUED] famotidine  (PEPCID ) 20 MG tablet Take 1 tablet (20 mg total) by mouth 2 (two) times daily.    [DISCONTINUED] LORazepam  (ATIVAN ) 2 MG tablet Take 1 tablet (2 mg total) by mouth 2 (two) times daily as needed for anxiety.    No facility-administered encounter medications on file as of 09/27/2024.    PAST MEDICAL HISTORY: Past Medical History:  Diagnosis Date   Anemia    Anxiety    Brain tumor (benign) ([name])     Colitis 2010   microscopic- Dr Abran   Depression    Diabetes mellitus    type II   Dyspnea    with exertion   ESRD (end stage renal disease) ([name])    TTUSAT [address]    Fibromyalgia    GERD (gastroesophageal reflux disease)    Headache    History of blood transfusion    after knee surgery   Hypertension    discontinued all diuretics and antihypertensives   IBS (irritable bowel syndrome)    LBP (low  back pain)    Neuropathy    feet bilat    Osteoarthritis    Osteopenia    Pneumonia    hx of 2014    Rotator cuff tear, right    Sinusitis    currently being treated with antibiotic will complete 03/04/2015    PAST SURGICAL HISTORY: Past Surgical History:  Procedure Laterality Date   A/V FISTULAGRAM N/A 02/03/2024   Procedure: A/V Fistulagram;  Surgeon: Norine Manuelita LABOR, MD;  Location: [facility];  Service: Cardiovascular;  Laterality: N/A;   A/V SHUNT INTERVENTION Right 05/30/2024   Procedure: A/V SHUNT INTERVENTION;  Surgeon: Pearline Norman RAMAN, MD;  Location: [facility];  Service: Cardiovascular;  Laterality: Right;   A/V SHUNT INTERVENTION N/A 09/07/2024   Procedure: A/V SHUNT INTERVENTION;  Surgeon: Lanis Fonda BRAVO, MD;  Location: [facility];  Service: Cardiovascular;  Laterality: N/A;   AV FISTULA PLACEMENT Right 09/12/2020   Procedure: RIGHT ARTERIOVENOUS (AV) FISTULA CREATION;  Surgeon: Harvey Carlin BRAVO, MD;  Location: [facility] OR;  Service: Vascular;  Laterality: Right;   BASCILIC VEIN TRANSPOSITION Right 10/27/2020   Procedure: RIGHT UPPER EXTREMITY SECOND STAGE BASCILIC VEIN TRANSPOSITION;  Surgeon: Harvey Carlin BRAVO, MD;  Location: [facility] OR;  Service: Vascular;  Laterality: Right;   BIOPSY  03/11/2022   Procedure: BIOPSY;  Surgeon: Wilhelmenia Aloha Raddle., MD;  Location: [facility] ENDOSCOPY;  Service: Gastroenterology;;   COLONOSCOPY WITH PROPOFOL  N/A 03/11/2022   Procedure: COLONOSCOPY WITH PROPOFOL ;  Surgeon: Wilhelmenia Aloha Raddle., MD;  Location: [facility] ENDOSCOPY;  Service:  Gastroenterology;  Laterality: N/A;   foramen magnum ependymoma surgery  2003   Dr Mora   JOINT REPLACEMENT Bilateral    NASAL SINUS SURGERY     1973    PERIPHERAL VASCULAR BALLOON ANGIOPLASTY  02/03/2024   Procedure: PERIPHERAL VASCULAR BALLOON ANGIOPLASTY;  Surgeon: Norine Manuelita LABOR, MD;  Location: [facility];  Service: Cardiovascular;;  Inflow Basilic Vein   REVERSE SHOULDER ARTHROPLASTY Right 06/03/2022   Procedure: REVERSE SHOULDER ARTHROPLASTY;  Surgeon: Melita Drivers, MD;  Location: [facility] ORS;  Service: Orthopedics;  Laterality: Right;    TONSILLECTOMY     TOTAL KNEE ARTHROPLASTY     L 2008, R 2009, R 2016- Dr hiram   TOTAL KNEE REVISION Right 03/05/2015   Procedure: RIGHT TOTAL KNEE ARTHROPLASTY REVISION;  Surgeon: Dempsey Moan, MD;  Location: [facility] ORS;  Service: Orthopedics;  Laterality: Right;   VENOUS ANGIOPLASTY  05/30/2024   Procedure: VENOUS ANGIOPLASTY;  Surgeon: Pearline Norman RAMAN, MD;  Location: [facility];  Service: Cardiovascular;;  central   VENOUS ANGIOPLASTY  09/07/2024   Procedure: VENOUS ANGIOPLASTY;  Surgeon: Lanis Fonda BRAVO, MD;  Location: [facility];  Service: Cardiovascular;;  innominate 70%    ALLERGIES: Allergies  Allergen Reactions   Erythromycin  Diarrhea and Nausea And Vomiting   Gabapentin Other (See Comments)    Confusion and falling  Hallucinations    FAMILY HISTORY: Family History  Problem Relation Age of Onset   Depression Mother    Hypertension Mother    Stroke Mother 52   Diabetes Mother    Diabetes Father    Hyperlipidemia Father    Hypertension Father    Crohn's disease Maternal Uncle    Diabetes Brother    Heart disease Brother    Hyperlipidemia Brother    Hypertension Brother    Prostate cancer Brother    Diabetes Brother    Heart disease Brother        Heart  Disease before age 48   Heart attack Brother    Stroke Brother        X's 2   Hyperlipidemia Brother    Hypertension Brother    Diabetes Other    Colon  cancer Neg Hx    Esophageal cancer Neg Hx    Stomach cancer Neg Hx    Pancreatic cancer Neg Hx    Breast cancer Neg Hx    BRCA 1/2 Neg Hx     SOCIAL HISTORY: Social History   Tobacco Use   Smoking status: Former    Current packs/day: 0.00    Average packs/day: 1 pack/day for 20.0 years (20.0 ttl pk-yrs)    Types: Cigarettes    Start date: 12/13/1968    Quit date: 12/13/1988    Years since quitting: 35.8   Smokeless tobacco: Never  Vaping Use   Vaping status: Never Used  Substance Use Topics   Alcohol  use: Not Currently    Alcohol /week: 0.0 standard drinks of alcohol     Comment: rarely   Drug use: No   Social History   Social History Narrative   Are you right handed or left handed? Right   Are you currently employed ?    What is your current occupation? Retired    Do you live at home alone?yes   Who lives with you?    What type of home do you live in: 1 story or 2 story? Apartment with elevator    Caffiene none    Objective:  Vital Signs:  BP (!) 106/48   Pulse 84   Ht 5' 1 (1.549 m)   Wt 182 lb (82.6 kg)   SpO2 94%   BMI 34.39 kg/m   General: General appearance: Awake and alert. No distress. Cooperative with exam.  Skin: No obvious rash or jaundice. HEENT: Atraumatic. Anicteric. Lungs: Non-labored breathing on room air  Heart: Regular Extremities: No edema.  Neurological: Mental Status: Alert. Speech fluent. No pseudobulbar affect Cranial Nerves: CNII: No RAPD. Visual fields intact. CNIII, IV, VI: PERRL. No nystagmus. EOMI. CN V: Facial sensation intact bilaterally to fine touch. CN VII: Facial muscles symmetric and strong. No ptosis at rest. CN VIII: Hears finger rub well bilaterally. CN IX: No hypophonia. CN X: Palate elevates symmetrically. CN XI: Full strength shoulder shrug bilaterally. CN XII: Tongue protrusion full and midline. No atrophy or fasciculations. No significant dysarthria Motor: Tone is normal Strength is 5/5 in bilateral upper  and lower extremities Reflexes:  Right Left  Bicep 2+ 2+  Tricep 2+ 2+  BrRad 2+ 2+  Knee 0 0  Ankle 0 0   Sensation: Vibration: Diminished in length dependent fashion in bilateral lower extremities Coordination: Intact finger-to- nose-finger bilaterally. Gait: Wide based, left ankle appears unstable with left foot internally rotated   Lab and Test Review: New results: 09/12/24: HbA1c: 6.1  03/28/24: IFE: no M protein B12: 472 B6 mildly elevated to 30.2  MRI brain w/wo contrast (04/23/24): IMPRESSION: 1.  No evidence of an acute intracranial abnormality. 2. No evidence of a residual/recurrent intracranial mass. 3. Mild chronic small vessel ischemic changes within the cerebral white matter and pons, progress since the brain MRI of 01/11/2013. 4. Small chronic infarct within the inferior right cerebellar hemisphere, new from the prior MRI. 5. Tiny chronic infarcts elsewhere within the bilateral hemispheres, unchanged. 6. Moderate left maxillary sinus mucosal thickening. 7. Abnormal T1 hypointense marrow signal throughout the C4 vertebral body and throughout visible portions of the C5 vertebral  body. Mild endplate enhancement also present at C4-C5. There is advanced C4-C5 disc degeneration, and these findings may be degenerative in etiology. However, a dedicated cervical spine MRI (with and without contrast) is recommended to exclude alternative etiologies (such as discitis/osteomyelitis and osseous metastatic disease).  MRI cervical spine w/wo contrast (05/27/24): FINDINGS: Alignment: Mild levoscoliosis. Reversal of the normal cervical lordosis with apex at C4-5. Trace degenerative retrolisthesis of C4 on C5 and C5 on C6.   Vertebrae: Vertebral body height maintained without acute or chronic fracture. Bone marrow signal intensity overall within normal limits. 1.4 cm atypical hemangioma noted within the T1 vertebral body. No worrisome osseous lesions. Heterogeneous marrow  signal intensity with enhancement involving the C4 and C5 vertebral bodies felt to be most consistent advanced degenerative changes. No paraspinous inflammation to suggest osteomyelitis discitis. No underlying pathologic lesion is visible. No other abnormal enhancement.   Cord: Normal signal and morphology.  No abnormal enhancement.   Posterior Fossa, vertebral arteries, paraspinal tissues: Patchy signal abnormality within the pons, most characteristic of chronic microvascular ischemic disease. Craniocervical junction within normal limits. Paraspinous soft tissues within normal limits. Normal flow voids seen within the vertebral arteries bilaterally.   Disc levels:   C2-C3: Small central disc protrusion indents the ventral thecal sac. No spinal stenosis. Foramina remain patent.   C3-C4: Small central disc protrusion indents the ventral thecal sac (series 105, image 9). No significant spinal stenosis. Right-sided uncovertebral spurring without significant foraminal encroachment.   C4-C5: Advanced degenerative intervertebral disc space narrowing with diffuse disc osteophyte complex. Associated endplate irregularity and spurring. Flattening and partial effacement of the ventral thecal sac with no more than mild spinal stenosis. Moderate left with severe right C5 foraminal stenosis.   C5-C6: Degenerative vertebral disc space narrowing with circumferential disc osteophyte complex. Flattening and partial effacement of the ventral thecal sac with mild spinal stenosis. Severe right with moderate left C6 foraminal stenosis.   C6-C7: Degenerative disc space narrowing with diffuse disc osteophyte complex. Flattening and partial effacement of the ventral thecal sac with mild spinal stenosis. Severe left with moderate right C7 foraminal stenosis.   C7-T1: Degenerative disc space narrowing with diffuse disc bulge and uncovertebral spurring. No spinal stenosis. Foramina remain patent.    IMPRESSION: 1. Heterogeneous marrow signal intensity with enhancement involving the C4 and C5 vertebral bodies, corresponding with abnormality on prior brain MRI. Overall, appearance is most consistent with advanced degenerative changes. No paraspinous inflammation to suggest osteomyelitis discitis. No underlying pathologic lesion. 2. Moderate to advanced multilevel cervical spondylosis with resultant mild diffuse spinal stenosis at C4-5 through C6-7. 3. Multifactorial degenerative changes with resultant multilevel foraminal narrowing as above. Notable findings include moderate left with severe right C5 foraminal stenosis, severe right with moderate left C6 foraminal narrowing, with severe left and moderate right C7 foraminal stenosis.  Previously reviewed results: 08/22/23: CMP significant for K 5.3, glucose 148, BUN 49, Cr 5.83 HbA1c: 6.5 TSH wnl Lipid panel: tChol 129, LDL 37, TG 207   Imaging/Procedures: CT head wo contrast (03/10/22): FINDINGS: Brain: There is mild cerebral atrophy with widening of the extra-axial spaces and ventricular dilatation. There are areas of decreased attenuation within the white matter tracts of the supratentorial brain, consistent with microvascular disease changes.   Vascular: No hyperdense vessel or unexpected calcification.   Skull: A suboccipital craniectomy defect is again seen.   Sinuses/Orbits: No acute finding.   Other: None.   IMPRESSION: 1. No acute intracranial abnormality. 2. Generalized cerebral atrophy and microvascular disease changes of  the supratentorial brain. 3. Evidence of prior suboccipital decompression.   CT cervical spine wo contrast (03/10/22): FINDINGS: Alignment: There is straightening of the normal cervical spine lordosis. There is predominant stable approximately 1 mm anterolisthesis of the C3 vertebral body on C4.   Skull base and vertebrae: No acute fracture. A chronic versus congenital deformities are  seen along the anterior arch and posterior arch of the C1 vertebral body.   Soft tissues and spinal canal: No prevertebral fluid or swelling. No visible canal hematoma.   Disc levels: Mild endplate sclerosis and anterior osteophyte formation is seen at the level of C4-C5. Moderate to marked severity endplate sclerosis is seen at the levels of C5-C6, C6-C7 and C7-T1.   There is moderate severity narrowing of the anterior atlantoaxial articulation. Moderate to marked severity intervertebral disc space narrowing is seen at C5-C6, C6-C7 and C7-T1.   Mild, bilateral multilevel facet joint hypertrophy is noted.   Upper chest: Negative.   Other: A suboccipital craniectomy defect is seen.   IMPRESSION: 1. No acute fracture or traumatic malalignment of the cervical spine. 2. Moderate to marked severity multilevel degenerative changes, as described above. 3. Straightening of the normal cervical spine lordosis, which may be secondary to patient positioning or muscle spasm.   MRI left ankle wo contrast (09/25/2015): IMPRESSION: 1. Subcortical fracture of the distal calcaneus at the cuboid consistent with a stress fracture. Adjacent stress reaction in the lateral process of the talus. 2. 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 The tear is at the level of the medial malleolus. 3. Slight tenosynovitis of the peroneal tendons and of flexor hallucis longus and extensor digitorum longus tendons. 4. Moderate ankle joint effusion which may represent posttraumatic synovitis.   MRI brain w/wo contrast (01/11/2013): Findings: Again seen is suboccipital craniectomy and resection of  the posterior elements of C1-C2 related to previous tumor resection  at the posterior craniocervical junction region in the distant  past.  Today's study does not show any restricted diffusion to  indicate acute or subacute infarction.  There are chronic  appearing  small vessel changes affecting the pons.  There are a few old  lacunar infarctions affecting the thalami, basal ganglia and mild  chronic small vessel changes affecting the cerebral hemispheric  white matter.  There has been only minimal progression since 2007.  No cortical or large vessel territory infarction.  There is no  evidence of residual or recurrent mass lesion.  No evidence of  hydrocephalus or extra-axial collection.  No skull or skull base  lesion.   IMPRESSION:  No acute finding.  No evidence of residual or recurrent ependymoma.  Chronic appearing small vessel changes of the pons, thalami, basal  ganglia and hemispheric white matter, minimally progressive since  2007.   ASSESSMENT: This is [name], a 73 y.o. female with imbalance and falls. 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 MRI cervical spine showed foraminal but not central canal stenosis that would contribute to imbalance. She has no symptoms of cervical radiculopathy currently. She has had no falls in the last 6 months.  Plan: -Discussed neuropathic pain medications. Given poor tolerability previously, patient deferred -Lidocaine  cream as needed -Discussed taking aspirin  81 mg daily given old strokes seen on  MRI brain of unknown age (new since 2014). We discussed the risks and benefits of aspirin . Patient would prefer to not take due to severe bruising.  -Fall precautions discussed  Return to clinic in 1 year  Total time spent reviewing records, interview, history/exam, documentation, and coordination of care on day of encounter:  35 min  Venetia Potters, MD

## 2024-09-26 ENCOUNTER — Encounter: Payer: Self-pay | Admitting: Cardiovascular Disease

## 2024-09-26 ENCOUNTER — Ambulatory Visit: Attending: Cardiovascular Disease | Admitting: Cardiovascular Disease

## 2024-09-26 VITALS — BP 120/62 | HR 85 | Ht 61.0 in | Wt 188.7 lb

## 2024-09-26 DIAGNOSIS — Z992 Dependence on renal dialysis: Secondary | ICD-10-CM | POA: Diagnosis not present

## 2024-09-26 DIAGNOSIS — I953 Hypotension of hemodialysis: Secondary | ICD-10-CM

## 2024-09-26 DIAGNOSIS — N186 End stage renal disease: Secondary | ICD-10-CM

## 2024-09-26 DIAGNOSIS — E7800 Pure hypercholesterolemia, unspecified: Secondary | ICD-10-CM

## 2024-09-26 DIAGNOSIS — I700 Atherosclerosis of aorta: Secondary | ICD-10-CM

## 2024-09-26 DIAGNOSIS — G720 Drug-induced myopathy: Secondary | ICD-10-CM | POA: Diagnosis not present

## 2024-09-26 DIAGNOSIS — T466X5A Adverse effect of antihyperlipidemic and antiarteriosclerotic drugs, initial encounter: Secondary | ICD-10-CM

## 2024-09-26 NOTE — Progress Notes (Signed)
 Cardiology Office Note:    Date:  09/26/2024   ID:  [name], DOB 18-Oct-1951, MRN [id number]  PCP:  Anita Karlynn GAILS, MD   [facility] Providers Cardiologist:  Anita Balding, MD     Referring MD: Anita Karlynn GAILS, MD   No chief complaint on file.    History of Present Illness:    [name] is a 73 y.o. female with a hx of end-stage renal disease (per patient report due to prolonged NSAID use for arthritis), type 2 diabetes mellitus also complicated by neuropathy and a variety of joint problems.  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  She is on hemodialysis Tuesday Thursday Saturday (Dr. Bernardino Gasman).  Continues to have problems with systolic blood pressure dropping to the 80s pretty much with every dialysis session, but they are able to continue dialysis, just stop ultrafiltration.  She has to take midodrine  before and during each hemodialysis session.  She has not had any episodes of symptomatic hypotension outside of dialysis since we last met.  Also has had a better year from the point of view of falls.  She has only had 1 fall since we last met.  She is using a walking stick.  Her neuropathy has worsened and is now numb pretty much up to her knees.  She has lost quite a bit of weight and now her dry weight is typically in the 83-84 kg range.  BMI remains in the moderately obese range at 34.  Diabetes mellitus control is excellent with a hemoglobin A1c of 6.1% on glimepiride  and Actos .  Cholesterol parameters are excellent as well with an LDL of 37.  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  The vascular specialist has told her that the area cannot be stented since it is located right beneath the clavicle.  Despite this she has excellent flow through a very large right forearm AV  fistula.  Her echocardiogram performed in December 2022 shows excellent findings with normal left ventricular systolic and diastolic function and no major valvular abnormalities. She had another episode of abdominal pain in late March2023.  Findings were suggestive of ischemic colitis.  She spent 3 days in the hospital and had a colonoscopy.  She previously was hospitalized with abdominal pain in August 2022 and was found to have inflammation of the terminal ileum.  She is on hemodialysis 3 days a week on a Tuesday Thursday Saturday schedule.  Her nephrologist is Dr. Gasman.  She is now anuric.  Her dry weight is 96.5 kg.  A random a.m. cortisol checked in August 2022 was 12.9, checked again in March 2023 was 16.6, making adrenal insufficiency less likely.  TSH was normal a year ago.  She is deemed not to be a transplant candidate due to her comorbid conditions and lack of social assistance/support.  She has not had abdominal surgeries and could be a candidate for peritoneal dialysis, but she does not have enough room in her small home to store all the necessary supplies.  Past Medical History:  Diagnosis Date   Anemia    Anxiety    Brain tumor (benign) ([name])    Colitis 2010   microscopic- Dr Abran   Depression    Diabetes mellitus    type II   Dyspnea    with  exertion   ESRD (end stage renal disease) ([name])    TTUSAT [address]    Fibromyalgia    GERD (gastroesophageal reflux disease)    Headache    History of blood transfusion    after knee surgery   Hypertension    discontinued all diuretics and antihypertensives   IBS (irritable bowel syndrome)    LBP (low back pain)    Neuropathy    feet bilat    Osteoarthritis    Osteopenia    Pneumonia    hx of 2014    Rotator cuff tear, right    Sinusitis    currently being treated with antibiotic will complete 03/04/2015    Past Surgical History:  Procedure Laterality Date   A/V FISTULAGRAM N/A 02/03/2024   Procedure: A/V  Fistulagram;  Surgeon: Norine Manuelita LABOR, MD;  Location: [facility];  Service: Cardiovascular;  Laterality: N/A;   A/V SHUNT INTERVENTION Right 05/30/2024   Procedure: A/V SHUNT INTERVENTION;  Surgeon: Pearline Norman RAMAN, MD;  Location: [facility] PV LAB;  Service: Cardiovascular;  Laterality: Right;   A/V SHUNT INTERVENTION N/A 09/07/2024   Procedure: A/V SHUNT INTERVENTION;  Surgeon: Lanis Fonda BRAVO, MD;  Location: [facility];  Service: Cardiovascular;  Laterality: N/A;   AV FISTULA PLACEMENT Right 09/12/2020   Procedure: RIGHT ARTERIOVENOUS (AV) FISTULA CREATION;  Surgeon: Harvey Carlin BRAVO, MD;  Location: [facility] OR;  Service: Vascular;  Laterality: Right;   BASCILIC VEIN TRANSPOSITION Right 10/27/2020   Procedure: RIGHT UPPER EXTREMITY SECOND STAGE BASCILIC VEIN TRANSPOSITION;  Surgeon: Harvey Carlin BRAVO, MD;  Location: [facility] OR;  Service: Vascular;  Laterality: Right;   BIOPSY  03/11/2022   Procedure: BIOPSY;  Surgeon: Wilhelmenia Aloha Raddle., MD;  Location: [facility] ENDOSCOPY;  Service: Gastroenterology;;   COLONOSCOPY WITH PROPOFOL  N/A 03/11/2022   Procedure: COLONOSCOPY WITH PROPOFOL ;  Surgeon: Wilhelmenia Aloha Raddle., MD;  Location: [facility] ENDOSCOPY;  Service: Gastroenterology;  Laterality: N/A;   foramen magnum ependymoma surgery  2003   Dr Mora   JOINT REPLACEMENT Bilateral    NASAL SINUS SURGERY     1973    PERIPHERAL VASCULAR BALLOON ANGIOPLASTY  02/03/2024   Procedure: PERIPHERAL VASCULAR BALLOON ANGIOPLASTY;  Surgeon: Norine Manuelita LABOR, MD;  Location: [facility];  Service: Cardiovascular;;  Inflow Basilic Vein   REVERSE SHOULDER ARTHROPLASTY Right 06/03/2022   Procedure: REVERSE SHOULDER ARTHROPLASTY;  Surgeon: Melita Drivers, MD;  Location: [facility] ORS;  Service: Orthopedics;  Laterality: Right;    TONSILLECTOMY     TOTAL KNEE ARTHROPLASTY     L 2008, R 2009, R 2016- Dr hiram   TOTAL KNEE REVISION Right 03/05/2015   Procedure: RIGHT TOTAL KNEE ARTHROPLASTY REVISION;  Surgeon: Dempsey Moan, MD;  Location: [facility] ORS;  Service: Orthopedics;  Laterality: Right;   VENOUS ANGIOPLASTY  05/30/2024   Procedure: VENOUS ANGIOPLASTY;  Surgeon: Pearline Norman RAMAN, MD;  Location: [facility];  Service: Cardiovascular;;  central   VENOUS ANGIOPLASTY  09/07/2024   Procedure: VENOUS ANGIOPLASTY;  Surgeon: Lanis Fonda BRAVO, MD;  Location: [facility];  Service: Cardiovascular;;  innominate 70%    Current Medications: Current Meds  Medication Sig   acetaminophen  (TYLENOL ) 500 MG tablet Take 1,000 mg by mouth 2 (two) times daily as needed for headache.   B Complex-C-Folic Acid  (RENA-VITE PO) Take 1 tablet by mouth every morning.   buPROPion  (WELLBUTRIN  XL) 300 MG 24 hr tablet Take 1 tablet (300 mg total) by mouth in the morning.  carboxymethylcellulose (REFRESH PLUS) 0.5 % SOLN Place 1 drop into both eyes 3 (three) times daily as needed (dry eyes/irritation).   cholecalciferol (VITAMIN D3) 25 MCG (1000 UNIT) tablet Take by mouth daily.   cinacalcet  (SENSIPAR ) 30 MG tablet Take 30 mg by mouth Every Tuesday,Thursday,and Saturday with dialysis.   cyclobenzaprine  (FLEXERIL ) 10 MG tablet Take 1 tablet (10 mg total) by mouth 3 (three) times daily as needed for muscle spasms.   diphenhydramine -acetaminophen  (TYLENOL  PM) 25-500 MG TABS tablet Take 2 tablets by mouth at bedtime as needed (sleep).   docusate sodium  (COLACE) 50 MG capsule Take 50 mg by mouth 2 (two) times daily.   Evolocumab  (REPATHA  SURECLICK) 140 MG/ML SOAJ INJECT 1 PEN SUBCUTANEOUSLY  EVERY 2 WEEKS   FLUoxetine  (PROZAC ) 40 MG capsule Take 1 capsule (40 mg total) by mouth in the morning.   glimepiride  (AMARYL ) 1 MG tablet TAKE 1 TABLET BY MOUTH DAILY  WITH BREAKFAST   ketoconazole  (NIZORAL ) 2 % cream Apply 1 Application topically daily. (Patient taking differently: Apply 1 Application topically as needed.)   lanthanum (FOSRENOL) 1000 MG chewable tablet Chew 1,000-2,000 mg by mouth See admin instructions. Taking 2000 mg with meals and 1000  mg  with snacks   LORazepam  (ATIVAN ) 2 MG tablet Take 1 tablet (2 mg total) by mouth 2 (two) times daily as needed for anxiety.   midodrine  (PROAMATINE ) 10 MG tablet Take 3 tablets (30 mg total) by mouth 3 (three) times a week. Take 30 minutes before dialysis and extra tablet during dialysis.   multivitamin-lutein (OCUVITE-LUTEIN) CAPS capsule Take 1 capsule by mouth every morning.   oxyCODONE  (ROXICODONE ) 15 MG immediate release tablet Take 1 tablet (15 mg total) by mouth 4 (four) times daily as needed.   pantoprazole  (PROTONIX ) 40 MG tablet TAKE 1 TABLET BY MOUTH TWICE  DAILY   pilocarpine  (SALAGEN ) 5 MG tablet TAKE 1 TABLET BY MOUTH TWICE  DAILY   pioglitazone  (ACTOS ) 15 MG tablet TAKE 1 TABLET BY MOUTH DAILY   polyethylene glycol (MIRALAX  / GLYCOLAX ) 17 g packet Take 17 g by mouth daily as needed for moderate constipation.   PREVIDENT 5000 DRY MOUTH 1.1 % GEL dental gel Place 1 application  onto teeth in the morning and at bedtime.   Semaglutide ,0.25 or 0.5MG /DOS, (OZEMPIC , 0.25 OR 0.5 MG/DOSE,) 2 MG/3ML SOPN INJECT SUBCUTANEOUSLY 0.5 MG  EVERY WEEK   sodium chloride  (OCEAN) 0.65 % SOLN nasal spray Place 1 spray into both nostrils as needed.     Allergies:   Erythromycin  and Gabapentin   Social History   Socioeconomic History   Marital status: Single    Spouse name: Not on file   Number of children: 0   Years of education: Not on file   Highest education level: Not on file  Occupational History   Not on file  Tobacco Use   Smoking status: Former    Current packs/day: 0.00    Average packs/day: 1 pack/day for 20.0 years (20.0 ttl pk-yrs)    Types: Cigarettes    Start date: 12/13/1968    Quit date: 12/13/1988    Years since quitting: 35.8   Smokeless tobacco: Never  Vaping Use   Vaping status: Never Used  Substance and Sexual Activity   Alcohol  use: Not Currently    Alcohol /week: 0.0 standard drinks of alcohol     Comment: rarely   Drug use: No   Sexual activity: Not Currently   Other Topics Concern   Not on file  Social History Narrative  Are you right handed or left handed? Right   Are you currently employed ?    What is your current occupation? Retired    Do you live at home alone?yes   Who lives with you?    What type of home do you live in: 1 story or 2 story? Apartment with Magazine features editor none   Social Drivers of Health   Financial Resource Strain: Low Risk  (08/17/2024)   Overall Financial Resource Strain (CARDIA)    Difficulty of Paying Living Expenses: Not hard at all  Food Insecurity: No Food Insecurity (08/17/2024)   Hunger Vital Sign    Worried About Running Out of Food in the Last Year: Never true    Ran Out of Food in the Last Year: Never true  Transportation Needs: No Transportation Needs (08/17/2024)   PRAPARE - Administrator, Civil Service (Medical): No    Lack of Transportation (Non-Medical): No  Physical Activity: Inactive (08/17/2024)   Exercise Vital Sign    Days of Exercise per Week: 0 days    Minutes of Exercise per Session: 0 min  Stress: No Stress Concern Present (08/17/2024)   [employer] of Occupational Health - Occupational Stress Questionnaire    Feeling of Stress: Not at all  Social Connections: Socially Isolated (08/17/2024)   Social Connection and Isolation Panel    Frequency of Communication with Friends and Family: More than three times a week    Frequency of Social Gatherings with Friends and Family: Twice a week    Attends Religious Services: Never    Database administrator or Organizations: No    Attends Engineer, structural: Never    Marital Status: Never married     Family History: The patient's family history includes Crohn's disease in her maternal uncle; Depression in her mother; Diabetes in her brother, brother, father, mother, and another family member; Heart attack in her brother; Heart disease in her brother and brother; Hyperlipidemia in her brother, brother, and father;  Hypertension in her brother, brother, father, and mother; Prostate cancer in her brother; Stroke in her brother; Stroke (age of onset: 62) in her mother. There is no history of Colon cancer, Esophageal cancer, Stomach cancer, Pancreatic cancer, Breast cancer, or BRCA 1/2.  ROS:   Please see the history of present illness.     All other systems reviewed and are negative.  EKGs/Labs/Other Studies Reviewed:    The following studies were reviewed today: Echocardiogram 12/09/2021  1. Left ventricular ejection fraction, by estimation, is 60 to 65%. The  left ventricle has normal function. The left ventricle has no regional  wall motion abnormalities. Left ventricular diastolic parameters were  normal.   2. Right ventricular systolic function is normal. The right ventricular  size is normal.   3. The mitral valve is normal in structure. No evidence of mitral valve  regurgitation. No evidence of mitral stenosis.   4. The aortic valve is normal in structure. Aortic valve regurgitation is  not visualized. Aortic valve sclerosis is present, with no evidence of  aortic valve stenosis.   5. The inferior vena cava is normal in size with greater than 50%  respiratory variability, suggesting right atrial pressure of 3 mmHg.   EKG:    EKG Interpretation Date/Time:  Wednesday September 26 2024 13:41:45 EDT Ventricular Rate:  85 PR Interval:  182 QRS Duration:  86 QT Interval:  404 QTC Calculation: 480 R Axis:   -20  Text Interpretation: Normal sinus rhythm with sinus arrhythmia Cannot rule out Anterior infarct , age undetermined When compared with ECG of 04-Jul-2023 08:41, No significant change was found Confirmed by Ruston Fedora [phone]) on 09/26/2024 2:01:06 PM         Recent Labs: No results found for requested labs within last 365 days.  Recent Lipid Panel    Component Value Date/Time   CHOL 129 08/22/2023 1220   CHOL 223 (H) 07/04/2023 0930   TRIG 207.0 (H) 08/22/2023 1220   HDL  50.60 08/22/2023 1220   HDL 54 07/04/2023 0930   CHOLHDL 3 08/22/2023 1220   VLDL 41.4 (H) 08/22/2023 1220   LDLCALC 37 08/22/2023 1220   LDLCALC 147 (H) 07/04/2023 0930     Risk Assessment/Calculations:           Physical Exam:    VS:  BP 120/62 (BP Location: Left Arm, Patient Position: Sitting, Cuff Size: Normal)   Pulse 85   Ht 5' 1 (1.549 m)   Wt 188 lb 11.2 oz (85.6 kg)   SpO2 96%   BMI 35.65 kg/m     Wt Readings from Last 3 Encounters:  09/26/24 188 lb 11.2 oz (85.6 kg)  09/12/24 189 lb 6.4 oz (85.9 kg)  08/17/24 185 lb 3.2 oz (84 kg)      General: Alert, oriented x3, no distress, borderline morbid obesity. Head: no evidence of trauma, PERRL, EOMI, no exophtalmos or lid lag, no myxedema, no xanthelasma; normal ears, nose and oropharynx Neck: normal jugular venous pulsations and no hepatojugular reflux; brisk carotid pulses without delay and no carotid bruits Chest: clear to auscultation, no signs of consolidation by percussion or palpation, normal fremitus, symmetrical and full respiratory excursions.  Resolving hematoma right upper chest and right shoulder Cardiovascular: Healthy appearing right upper arm AV fistula, with excellent thrill and loud bruit that radiates broadly towards the upper chest and neck, normal position and quality of the apical impulse, regular rhythm, normal first and second heart sounds, no murmurs, rubs or gallops Abdomen: no tenderness or distention, no masses by palpation, no abnormal pulsatility or arterial bruits, normal bowel sounds, no hepatosplenomegaly Extremities: no clubbing, cyanosis or edema; 2+ radial, ulnar and brachial pulses bilaterally; 2+ right femoral, posterior tibial and dorsalis pedis pulses; 2+ left femoral, posterior tibial and dorsalis pedis pulses; no subclavian or femoral bruits Neurological: grossly nonfocal Psych: Normal mood and affect   ASSESSMENT:    1. Hemodialysis-associated hypotension   2. ESRD on  hemodialysis ([name])   3. Atherosclerosis of aorta   4. Pure hypercholesterolemia   5. Statin myopathy     PLAN:    In order of problems listed above:  Dialysis related hypotension: Continues to have hypotension during dialysis, but this has not led to interruption in treatments.  Takes midodrine  30-45 minutes before dialysis and again halfway through dialysis.   DM: Very well controlled with very low doses of medication.  Only other diabetic complication is neuropathy, which is likely a significant contributor to her falls.. Aortic and coronary atherosclerosis: Incidentally noted on imaging studies, but with normal caliber aorta and no evidence of significant atherosclerotic disease in the mesenteric branches.  Reportedly has had a previous episode of ischemic colitis despite that.   HLP: Due to recheck labs.  History of intolerance due to statin myopathy.  Excellent LDL cholesterol on Repatha .           Medication Adjustments/Labs and Tests Ordered: Current medicines are reviewed at length with the  patient today.  Concerns regarding medicines are outlined above.  Orders Placed This Encounter  Procedures   Lipid panel   EKG 12-Lead   No orders of the defined types were placed in this encounter.    Patient Instructions  Medication Instructions:  Continue all medications *If you need a refill on your cardiac medications before your next appointment, please call your pharmacy*  Lab Work: Fasting lipid panel   may have done at dialysis   Testing/Procedures: None ordered  Follow-Up: At [facility], you and your health needs are our priority.  As part of our continuing mission to provide you with exceptional heart care, our providers are all part of one team.  This team includes your primary Cardiologist (physician) and Advanced Practice Providers or APPs (Physician Assistants and Nurse Practitioners) who all work together to provide you with the care you need, when you  need it.  Your next appointment:  1 year   Call in May to schedule Oct appointment     Provider:  Dr.Dermot Gremillion   We recommend signing up for the patient portal called MyChart.  Sign up information is provided on this After Visit Summary.  MyChart is used to connect with patients for Virtual Visits (Telemedicine).  Patients are able to view lab/test results, encounter notes, upcoming appointments, etc.  Non-urgent messages can be sent to your provider as well.   To learn more about what you can do with MyChart, go to [email or web address].         Signed, Anita Balding, MD  09/26/2024 2:15 PM    [facility]

## 2024-09-26 NOTE — Patient Instructions (Signed)
 Medication Instructions:  Continue all medications *If you need a refill on your cardiac medications before your next appointment, please call your pharmacy*  Lab Work: Fasting lipid panel   may have done at dialysis   Testing/Procedures: None ordered  Follow-Up: At [facility], you and your health needs are our priority.  As part of our continuing mission to provide you with exceptional heart care, our providers are all part of one team.  This team includes your primary Cardiologist (physician) and Advanced Practice Providers or APPs (Physician Assistants and Nurse Practitioners) who all work together to provide you with the care you need, when you need it.  Your next appointment:  1 year   Call in May to schedule Oct appointment     Provider:  Dr.Croitoru   We recommend signing up for the patient portal called MyChart.  Sign up information is provided on this After Visit Summary.  MyChart is used to connect with patients for Virtual Visits (Telemedicine).  Patients are able to view lab/test results, encounter notes, upcoming appointments, etc.  Non-urgent messages can be sent to your provider as well.   To learn more about what you can do with MyChart, go to [email or web address].

## 2024-09-27 ENCOUNTER — Other Ambulatory Visit: Payer: Self-pay | Admitting: Internal Medicine

## 2024-09-27 ENCOUNTER — Ambulatory Visit (INDEPENDENT_AMBULATORY_CARE_PROVIDER_SITE_OTHER): Admitting: Neurology

## 2024-09-27 ENCOUNTER — Encounter: Payer: Self-pay | Admitting: Neurology

## 2024-09-27 VITALS — BP 106/48 | HR 84 | Ht 61.0 in | Wt 182.0 lb

## 2024-09-27 DIAGNOSIS — G629 Polyneuropathy, unspecified: Secondary | ICD-10-CM

## 2024-09-27 DIAGNOSIS — R2689 Other abnormalities of gait and mobility: Secondary | ICD-10-CM | POA: Diagnosis not present

## 2024-09-27 DIAGNOSIS — I951 Orthostatic hypotension: Secondary | ICD-10-CM | POA: Diagnosis not present

## 2024-09-27 DIAGNOSIS — C719 Malignant neoplasm of brain, unspecified: Secondary | ICD-10-CM

## 2024-09-27 NOTE — Patient Instructions (Signed)
Preventing Falls at Home  Falls are common, often dreaded events in the lives of older people. Aside from the obvious injuries and even death that may result, fall can cause wide-ranging consequences including loss of independence, mental decline, decreased activity and mobility. Younger people are also at risk of falling, especially those with chronic illnesses and fatigue.  Ways to reduce risk for falling Examine diet and medications. Warm foods and alcohol dilate blood vessels, which can lead to dizziness when standing. Sleep aids, antidepressants and pain medications can also increase the likelihood of a fall.  Get a vision exam. Poor vision, cataracts and glaucoma increase the chances of falling.  Check foot gear. Shoes should fit snugly and have a sturdy, nonskid sole and a broad, low heel  Participate in a physician-approved exercise program to build and maintain muscle strength and improve balance and coordination. Programs that use ankle weights or stretch bands are excellent for muscle-strengthening. Water aerobics programs and low-impact Tai Chi programs have also been shown to improve balance and coordination.  Increase vitamin D intake. Vitamin D improves muscle strength and increases the amount of calcium the body is able to absorb and deposit in bones.  How to prevent falls from common hazards Floors - Remove all loose wires, cords, and throw rugs. Minimize clutter. Make sure rugs are anchored and smooth. Keep furniture in its usual place.  Chairs -- Use chairs with straight backs, armrests and firm seats. Add firm cushions to existing pieces to add height.  Bathroom - Install grab bars and non-skid tape in the tub or shower. Use a bathtub transfer bench or a shower chair with a back support Use an elevated toilet seat and/or safety rails to assist standing from a low surface. Do not use towel racks or bathroom tissue holders to help you stand.  Lighting - Make sure halls,  stairways, and entrances are well-lit. Install a night light in your bathroom or hallway. Make sure there is a light switch at the top and bottom of the staircase. Turn lights on if you get up in the middle of the night. Make sure lamps or light switches are within reach of the bed if you have to get up during the night.  Kitchen - Install non-skid rubber mats near the sink and stove. Clean spills immediately. Store frequently used utensils, pots, pans between waist and eye level. This helps prevent reaching and bending. Sit when getting things out of lower cupboards.  Living room/ Bedrooms - Place furniture with wide spaces in between, giving enough room to move around. Establish a route through the living room that gives you something to hold onto as you walk.  Stairs - Make sure treads, rails, and rugs are secure. Install a rail on both sides of the stairs. If stairs are a threat, it might be helpful to arrange most of your activities on the lower level to reduce the number of times you must climb the stairs.  Entrances and doorways - Install metal handles on the walls adjacent to the doorknobs of all doors to make it more secure as you travel through the doorway.  Tips for maintaining balance Keep at least one hand free at all times. Try using a backpack or fanny pack to hold things rather than carrying them in your hands. Never carry objects in both hands when walking as this interferes with keeping your balance.  Attempt to swing both arms from front to back while walking. This might require a conscious   your balance.   Attempt to swing both arms from front to back while walking. This might require a conscious effort if Parkinson's disease has diminished your movement. It will, however, help you to maintain balance and posture, and reduce fatigue.   Consciously lift your feet off of the ground when walking. Shuffling and dragging of the feet is a common culprit in losing your balance.   When trying to navigate turns, use a "U" technique of facing forward and making a wide turn, rather than  pivoting sharply.   Try to stand with your feet shoulder-length apart. When your feet are close together for any length of time, you increase your risk of losing your balance and falling.   Do one thing at a time. Don't try to walk and accomplish another task, such as reading or looking around. The decrease in your automatic reflexes complicates motor function, so the less distraction, the better.   Do not wear rubber or gripping soled shoes, they might "catch" on the floor and cause tripping.   Move slowly when changing positions. Use deliberate, concentrated movements and, if needed, use a grab bar or walking aid. Count 15 seconds between each movement. For example, when rising from a seated position, wait 15 seconds after standing to begin walking.   If balance is a continuous problem, you might want to consider a walking aid such as a cane, walking stick, or walker. Once you've mastered walking with help, you might be ready to try it on your own again.

## 2024-10-15 ENCOUNTER — Other Ambulatory Visit: Payer: Self-pay | Admitting: Internal Medicine

## 2024-10-15 NOTE — Telephone Encounter (Signed)
 Copied from CRM [id number]. Topic: Clinical - Medication Refill >> Oct 15, 2024 12:44 PM Victoria A wrote: Medication: glimepiride  (AMARYL ) 1 MG tablet;pioglitazone  (ACTOS ) 15 MG tablet;buPROPion  (WELLBUTRIN  XL) 300 MG 24 hr tablet  Has the patient contacted their pharmacy? No (Agent: If no, request that the patient contact the pharmacy for the refill. If patient does not wish to contact the pharmacy document the reason why and proceed with request.) (Agent: If yes, when and what did the pharmacy advise?) Was not able to get thru to pharmacy  This is the patient's preferred pharmacy:    [facility] - [place], [name] - [address] [place] [address] Phone: [phone] Fax: [phone]   Is this the correct pharmacy for this prescription? Yes If no, delete pharmacy and type the correct one.   Has the prescription been filled recently? No  Is the patient out of the medication? Yes  Has the patient been seen for an appointment in the last year OR does the patient have an upcoming appointment? Yes  Can we respond through MyChart? Yes  Agent: Please be advised that Rx refills may take up to 3 business days. We ask that you follow-up with your pharmacy.

## 2024-10-16 ENCOUNTER — Ambulatory Visit: Payer: Self-pay | Admitting: Cardiovascular Disease

## 2024-10-16 DIAGNOSIS — E7800 Pure hypercholesterolemia, unspecified: Secondary | ICD-10-CM

## 2024-10-16 LAB — LIPID PANEL
Chol/HDL Ratio: 3.6 ratio (ref 0.0–4.4)
Cholesterol, Total: 199 mg/dL (ref 100–199)
HDL: 56 mg/dL (ref >39)
LDL Chol Calc (NIH): 125 mg/dL — ABNORMAL HIGH (ref 0–99)
Triglycerides: 101 mg/dL (ref 0–149)
VLDL Cholesterol Cal: 18 mg/dL (ref 5–40)

## 2024-10-16 NOTE — Telephone Encounter (Signed)
  Anita Croitoru, MD 10/16/2024  9:30 AM EST     LDL cholesterol is not at target range.  Due to the history of diabetes mellitus, we would want the LDL to be less than 100 and it was 125.  In September 2024 the LDL was much better at 37.  Is she receiving the Repatha  every 2 weeks?  If not, can we please restart it    Went over the results with the patient. Dr Francyne spoke with the patient, discussed the plan of care. Pt reports being on dialysis, having a limited diet, and also losing a lot of weight. She really does not want to be on more medication if she can help it. Decided to re-check a lipid panel in 3 months. The patient verbalized understanding.

## 2024-10-19 MED ORDER — GLIMEPIRIDE 1 MG PO TABS: 1.0000 mg | ORAL_TABLET | Freq: Every day | ORAL | 2 refills | Status: AC

## 2024-10-19 MED ORDER — BUPROPION HCL ER (XL) 300 MG PO TB24: 300.0000 mg | ORAL_TABLET | Freq: Every morning | ORAL | 1 refills | Status: AC

## 2024-10-19 MED ORDER — PIOGLITAZONE HCL 15 MG PO TABS: 15.0000 mg | ORAL_TABLET | Freq: Every day | ORAL | 2 refills | Status: AC

## 2024-10-24 ENCOUNTER — Other Ambulatory Visit: Payer: Self-pay | Admitting: Cardiovascular Disease

## 2024-10-24 DIAGNOSIS — I2510 Atherosclerotic heart disease of native coronary artery without angina pectoris: Secondary | ICD-10-CM

## 2024-10-24 DIAGNOSIS — E7800 Pure hypercholesterolemia, unspecified: Secondary | ICD-10-CM

## 2024-10-24 DIAGNOSIS — I700 Atherosclerosis of aorta: Secondary | ICD-10-CM

## 2024-11-02 ENCOUNTER — Other Ambulatory Visit: Payer: Self-pay | Admitting: Internal Medicine

## 2024-11-02 LAB — OPHTHALMOLOGY REPORT-SCANNED

## 2024-11-06 ENCOUNTER — Ambulatory Visit: Payer: Self-pay

## 2024-11-06 ENCOUNTER — Other Ambulatory Visit: Payer: Self-pay

## 2024-11-06 ENCOUNTER — Inpatient Hospital Stay (HOSPITAL_COMMUNITY)
Admission: EM | Admit: 2024-11-06 | Discharge: 2024-11-12 | DRG: 871 | Disposition: A | Discharge status: Home / Self Care | Attending: Internal Medicine | Admitting: Internal Medicine

## 2024-11-06 ENCOUNTER — Emergency Department (HOSPITAL_COMMUNITY)

## 2024-11-06 ENCOUNTER — Encounter (HOSPITAL_COMMUNITY): Payer: Self-pay

## 2024-11-06 ENCOUNTER — Ambulatory Visit: Admitting: Family Medicine

## 2024-11-06 ENCOUNTER — Encounter: Payer: Self-pay | Admitting: Family Medicine

## 2024-11-06 VITALS — BP 130/68 | HR 106 | Temp 102.7°F | Ht 61.0 in | Wt 182.0 lb

## 2024-11-06 DIAGNOSIS — R6521 Severe sepsis with septic shock: Secondary | ICD-10-CM | POA: Diagnosis present

## 2024-11-06 DIAGNOSIS — D631 Anemia in chronic kidney disease: Secondary | ICD-10-CM | POA: Diagnosis present

## 2024-11-06 DIAGNOSIS — Z66 Do not resuscitate: Secondary | ICD-10-CM | POA: Diagnosis present

## 2024-11-06 DIAGNOSIS — Z823 Family history of stroke: Secondary | ICD-10-CM

## 2024-11-06 DIAGNOSIS — Z833 Family history of diabetes mellitus: Secondary | ICD-10-CM

## 2024-11-06 DIAGNOSIS — N2581 Secondary hyperparathyroidism of renal origin: Secondary | ICD-10-CM | POA: Diagnosis present

## 2024-11-06 DIAGNOSIS — Z6835 Body mass index (BMI) 35.0-35.9, adult: Secondary | ICD-10-CM

## 2024-11-06 DIAGNOSIS — Z992 Dependence on renal dialysis: Secondary | ICD-10-CM

## 2024-11-06 DIAGNOSIS — E1122 Type 2 diabetes mellitus with diabetic chronic kidney disease: Secondary | ICD-10-CM | POA: Diagnosis present

## 2024-11-06 DIAGNOSIS — R197 Diarrhea, unspecified: Secondary | ICD-10-CM

## 2024-11-06 DIAGNOSIS — F419 Anxiety disorder, unspecified: Secondary | ICD-10-CM | POA: Diagnosis present

## 2024-11-06 DIAGNOSIS — I9589 Other hypotension: Secondary | ICD-10-CM | POA: Diagnosis present

## 2024-11-06 DIAGNOSIS — K55039 Acute (reversible) ischemia of large intestine, extent unspecified: Secondary | ICD-10-CM | POA: Diagnosis present

## 2024-11-06 DIAGNOSIS — R509 Fever, unspecified: Secondary | ICD-10-CM

## 2024-11-06 DIAGNOSIS — Z7982 Long term (current) use of aspirin: Secondary | ICD-10-CM

## 2024-11-06 DIAGNOSIS — Z87891 Personal history of nicotine dependence: Secondary | ICD-10-CM

## 2024-11-06 DIAGNOSIS — Z96651 Presence of right artificial knee joint: Secondary | ICD-10-CM | POA: Diagnosis present

## 2024-11-06 DIAGNOSIS — Z818 Family history of other mental and behavioral disorders: Secondary | ICD-10-CM

## 2024-11-06 DIAGNOSIS — E1140 Type 2 diabetes mellitus with diabetic neuropathy, unspecified: Secondary | ICD-10-CM | POA: Diagnosis present

## 2024-11-06 DIAGNOSIS — E871 Hypo-osmolality and hyponatremia: Secondary | ICD-10-CM | POA: Diagnosis present

## 2024-11-06 DIAGNOSIS — N186 End stage renal disease: Secondary | ICD-10-CM

## 2024-11-06 DIAGNOSIS — Z7985 Long-term (current) use of injectable non-insulin antidiabetic drugs: Secondary | ICD-10-CM

## 2024-11-06 DIAGNOSIS — Z1152 Encounter for screening for COVID-19: Secondary | ICD-10-CM

## 2024-11-06 DIAGNOSIS — I120 Hypertensive chronic kidney disease with stage 5 chronic kidney disease or end stage renal disease: Secondary | ICD-10-CM | POA: Diagnosis present

## 2024-11-06 DIAGNOSIS — K529 Noninfective gastroenteritis and colitis, unspecified: Secondary | ICD-10-CM

## 2024-11-06 DIAGNOSIS — E785 Hyperlipidemia, unspecified: Secondary | ICD-10-CM | POA: Diagnosis present

## 2024-11-06 DIAGNOSIS — R1031 Right lower quadrant pain: Secondary | ICD-10-CM

## 2024-11-06 DIAGNOSIS — R579 Shock, unspecified: Secondary | ICD-10-CM | POA: Diagnosis present

## 2024-11-06 DIAGNOSIS — Z96611 Presence of right artificial shoulder joint: Secondary | ICD-10-CM | POA: Diagnosis present

## 2024-11-06 DIAGNOSIS — Z7984 Long term (current) use of oral hypoglycemic drugs: Secondary | ICD-10-CM

## 2024-11-06 DIAGNOSIS — G894 Chronic pain syndrome: Secondary | ICD-10-CM | POA: Diagnosis present

## 2024-11-06 DIAGNOSIS — A419 Sepsis, unspecified organism: Principal | ICD-10-CM | POA: Diagnosis present

## 2024-11-06 DIAGNOSIS — Z83438 Family history of other disorder of lipoprotein metabolism and other lipidemia: Secondary | ICD-10-CM

## 2024-11-06 DIAGNOSIS — R935 Abnormal findings on diagnostic imaging of other abdominal regions, including retroperitoneum: Secondary | ICD-10-CM

## 2024-11-06 DIAGNOSIS — K219 Gastro-esophageal reflux disease without esophagitis: Secondary | ICD-10-CM | POA: Diagnosis present

## 2024-11-06 DIAGNOSIS — Z8249 Family history of ischemic heart disease and other diseases of the circulatory system: Secondary | ICD-10-CM

## 2024-11-06 DIAGNOSIS — Z86011 Personal history of benign neoplasm of the brain: Secondary | ICD-10-CM

## 2024-11-06 DIAGNOSIS — M797 Fibromyalgia: Secondary | ICD-10-CM | POA: Diagnosis present

## 2024-11-06 DIAGNOSIS — F32A Depression, unspecified: Secondary | ICD-10-CM | POA: Diagnosis present

## 2024-11-06 DIAGNOSIS — E66812 Obesity, class 2: Secondary | ICD-10-CM | POA: Diagnosis present

## 2024-11-06 LAB — POC COVID19 BINAXNOW: SARS Coronavirus 2 Ag: NEGATIVE

## 2024-11-06 LAB — COMPREHENSIVE METABOLIC PANEL WITH GFR
ALT: 20 U/L (ref 0–44)
AST: 23 U/L (ref 15–41)
Albumin: 3.3 g/dL — ABNORMAL LOW (ref 3.5–5.0)
Alkaline Phosphatase: 102 U/L (ref 38–126)
Anion gap: 17 — ABNORMAL HIGH (ref 5–15)
BUN: 41 mg/dL — ABNORMAL HIGH (ref 8–23)
CO2: 25 mmol/L (ref 22–32)
Calcium: 9.3 mg/dL (ref 8.9–10.3)
Chloride: 90 mmol/L — ABNORMAL LOW (ref 98–111)
Creatinine, Ser: 4.26 mg/dL — ABNORMAL HIGH (ref 0.44–1.00)
GFR, Estimated: 11 mL/min — ABNORMAL LOW (ref >60)
Glucose, Bld: 172 mg/dL — ABNORMAL HIGH (ref 70–99)
Potassium: 4.4 mmol/L (ref 3.5–5.1)
Sodium: 132 mmol/L — ABNORMAL LOW (ref 135–145)
Total Bilirubin: 0.9 mg/dL (ref 0.0–1.2)
Total Protein: 6.8 g/dL (ref 6.5–8.1)

## 2024-11-06 LAB — CBC WITH DIFFERENTIAL/PLATELET
Basophils Absolute: 0.3 K/uL — ABNORMAL HIGH (ref 0.0–0.1)
Basophils Relative: 1 %
Eosinophils Absolute: 0 K/uL (ref 0.0–0.5)
Eosinophils Relative: 0 %
HCT: 34.6 % — ABNORMAL LOW (ref 36.0–46.0)
Hemoglobin: 11.1 g/dL — ABNORMAL LOW (ref 12.0–15.0)
Lymphocytes Relative: 3 %
Lymphs Abs: 1 K/uL (ref 0.7–4.0)
MCH: 31.3 pg (ref 26.0–34.0)
MCHC: 32.1 g/dL (ref 30.0–36.0)
MCV: 97.5 fL (ref 80.0–100.0)
Monocytes Absolute: 1 K/uL (ref 0.1–1.0)
Monocytes Relative: 3 %
Neutro Abs: 32.5 K/uL — ABNORMAL HIGH (ref 1.7–7.7)
Neutrophils Relative %: 93 %
Platelets: 216 K/uL (ref 150–400)
RBC: 3.55 MIL/uL — ABNORMAL LOW (ref 3.87–5.11)
RDW: 14.7 % (ref 11.5–15.5)
WBC: 34.9 K/uL — ABNORMAL HIGH (ref 4.0–10.5)
nRBC: 0 % (ref 0.0–0.2)

## 2024-11-06 LAB — GLUCOSE, CAPILLARY
Glucose-Capillary: 144 mg/dL — ABNORMAL HIGH (ref 70–99)
Glucose-Capillary: 206 mg/dL — ABNORMAL HIGH (ref 70–99)

## 2024-11-06 LAB — I-STAT CG4 LACTIC ACID, ED
Lactic Acid, Venous: 1.9 mmol/L (ref 0.5–1.9)
Lactic Acid, Venous: 2.8 mmol/L (ref 0.5–1.9)

## 2024-11-06 LAB — POC INFLUENZA A&B (BINAX/QUICKVUE)
Influenza A, POC: NEGATIVE
Influenza B, POC: NEGATIVE

## 2024-11-06 LAB — RESP PANEL BY RT-PCR (RSV, FLU A&B, COVID)  RVPGX2
Influenza A by PCR: NEGATIVE
Influenza B by PCR: NEGATIVE
Resp Syncytial Virus by PCR: NEGATIVE
SARS Coronavirus 2 by RT PCR: NEGATIVE

## 2024-11-06 LAB — PROTIME-INR
INR: 1.1 (ref 0.8–1.2)
Prothrombin Time: 15 s (ref 11.4–15.2)

## 2024-11-06 LAB — LACTIC ACID, PLASMA
Lactic Acid, Venous: 1.8 mmol/L (ref 0.5–1.9)
Lactic Acid, Venous: 1.5 mmol/L (ref 0.5–1.9)

## 2024-11-06 MED ORDER — LACTATED RINGERS IV BOLUS (SEPSIS)
500.0000 mL | Freq: Once | INTRAVENOUS | Status: AC
Start: 2024-11-06 — End: 2024-11-06
  Administered 2024-11-06: 500 mL via INTRAVENOUS

## 2024-11-06 MED ORDER — SODIUM CHLORIDE 0.9 % IV SOLN
250.0000 mL | INTRAVENOUS | Status: AC
  Administered 2024-11-06: 250 mL via INTRAVENOUS

## 2024-11-06 MED ORDER — ONDANSETRON HCL 4 MG/2ML IJ SOLN: 4.0000 mg | Freq: Four times a day (QID) | INTRAMUSCULAR | Status: DC | PRN

## 2024-11-06 MED ORDER — VANCOMYCIN HCL IN DEXTROSE 1-5 GM/200ML-% IV SOLN: 1000.0000 mg | Freq: Once | INTRAVENOUS | Status: DC

## 2024-11-06 MED ORDER — LACTATED RINGERS IV SOLN: INTRAVENOUS | Status: DC

## 2024-11-06 MED ORDER — DOCUSATE SODIUM 100 MG PO CAPS: 100.0000 mg | ORAL_CAPSULE | Freq: Two times a day (BID) | ORAL | Status: DC | PRN

## 2024-11-06 MED ORDER — HYDROMORPHONE HCL 1 MG/ML IJ SOLN
0.5000 mg | INTRAMUSCULAR | Status: DC | PRN
  Administered 2024-11-06 – 2024-11-07 (×3): 0.5 mg via INTRAVENOUS
  Filled 2024-11-06 (×3): qty 1

## 2024-11-06 MED ORDER — INSULIN ASPART 100 UNIT/ML IJ SOLN
0.0000 [IU] | INTRAMUSCULAR | Status: DC
  Administered 2024-11-06: 3 [IU] via SUBCUTANEOUS
  Administered 2024-11-06: 1 [IU] via SUBCUTANEOUS
  Administered 2024-11-07: 3 [IU] via SUBCUTANEOUS
  Filled 2024-11-06: qty 3
  Filled 2024-11-06: qty 1
  Filled 2024-11-06: qty 3

## 2024-11-06 MED ORDER — METRONIDAZOLE 500 MG/100ML IV SOLN
500.0000 mg | Freq: Once | INTRAVENOUS | Status: AC
  Administered 2024-11-06: 500 mg via INTRAVENOUS
  Filled 2024-11-06: qty 100

## 2024-11-06 MED ORDER — VANCOMYCIN HCL 1750 MG/350ML IV SOLN
1750.0000 mg | Freq: Once | INTRAVENOUS | Status: AC
  Administered 2024-11-06: 1750 mg via INTRAVENOUS
  Filled 2024-11-06: qty 350

## 2024-11-06 MED ORDER — SODIUM CHLORIDE 0.9 % IV SOLN
2.0000 g | Freq: Once | INTRAVENOUS | Status: AC
  Administered 2024-11-06: 2 g via INTRAVENOUS
  Filled 2024-11-06: qty 20

## 2024-11-06 MED ORDER — ORAL CARE MOUTH RINSE: 15.0000 mL | OROMUCOSAL | Status: DC | PRN

## 2024-11-06 MED ORDER — PIPERACILLIN-TAZOBACTAM IN DEX 2-0.25 GM/50ML IV SOLN
2.2500 g | Freq: Three times a day (TID) | INTRAVENOUS | Status: DC
  Administered 2024-11-06 – 2024-11-12 (×18): 2.25 g via INTRAVENOUS
  Filled 2024-11-06 (×21): qty 50

## 2024-11-06 MED ORDER — POLYETHYLENE GLYCOL 3350 17 G PO PACK: 17.0000 g | PACK | Freq: Every day | ORAL | Status: DC | PRN

## 2024-11-06 MED ORDER — VANCOMYCIN HCL 125 MG PO CAPS
125.0000 mg | ORAL_CAPSULE | Freq: Four times a day (QID) | ORAL | Status: DC
  Administered 2024-11-06 – 2024-11-08 (×6): 125 mg via ORAL
  Filled 2024-11-06 (×8): qty 1

## 2024-11-06 MED ORDER — NOREPINEPHRINE 4 MG/250ML-% IV SOLN
0.0000 ug/min | INTRAVENOUS | Status: DC
  Administered 2024-11-06: 2 ug/min via INTRAVENOUS
  Administered 2024-11-07: 4 ug/min via INTRAVENOUS
  Filled 2024-11-06 (×2): qty 250

## 2024-11-06 MED ORDER — LACTATED RINGERS IV BOLUS
2000.0000 mL | Freq: Once | INTRAVENOUS | Status: AC
  Administered 2024-11-06: 2000 mL via INTRAVENOUS

## 2024-11-06 MED ORDER — CHLORHEXIDINE GLUCONATE CLOTH 2 % EX PADS
6.0000 | MEDICATED_PAD | Freq: Every day | CUTANEOUS | Status: DC
  Administered 2024-11-06 – 2024-11-10 (×4): 6 via TOPICAL

## 2024-11-06 MED ORDER — NOREPINEPHRINE 4 MG/250ML-% IV SOLN
0.0000 ug/min | INTRAVENOUS | Status: DC
  Administered 2024-11-06: 2 ug/min via INTRAVENOUS
  Filled 2024-11-06: qty 250

## 2024-11-06 NOTE — ED Triage Notes (Signed)
 Pt BIB EMS from PCP office. Pt is febrile and having complications with dialysis (infiltration).  Right sided abdominal pain and diarrhea since last night. Aox4 upon arrival.   EMS Vitals  BP 102(systolic) HR 100 SPO2 92% room air CBG 182 RR 20  650 mg Tylenol  en route

## 2024-11-06 NOTE — ED Provider Notes (Signed)
 [place] EMERGENCY DEPARTMENT AT [facility] Provider Note   CSN: [id number] Arrival date & time: 11/06/24  1545     Patient presents with: Code Sepsis   [name] is a 73 y.o. female.   Patient is a 73 year old female with a history of diabetes, end-stage renal disease on dialysis Tuesday Thursday Saturday, hypertension, anemia and prior colitis who is presenting today from PCP office after being found to be febrile up to 102.  Patient reports that she started feeling unwell yesterday after she got done with dialysis.  She reports she had multiple loose stools after dialysis which is not always unusual but had generalized abdominal pain worse on the right and was just not feeling well.  Today was the same she denied any nausea or vomiting but reports has really not eaten much due to anorexia.  She has not had a cough or shortness of breath.  No blood in the stool.  She makes no urine.  She went to her doctor today and they found that she had temperature of 102.  She was not aware of a fever yesterday.  She did have a course of dialysis yesterday and was planning on dialyzing tomorrow for the holiday schedule.  The history is provided by the patient, medical records and the EMS personnel.       Prior to Admission medications   Medication Sig Start Date End Date Taking? Authorizing Provider  Accu-Chek Softclix Lancets lancets Use 1 lancet up to 4 times daily as directed 03/12/22   Samtani, Jai-Gurmukh, MD  acetaminophen  (TYLENOL ) 500 MG tablet Take 1,000 mg by mouth 2 (two) times daily as needed for headache.    [provider]  aspirin -acetaminophen -caffeine (EXCEDRIN MIGRAINE) 250-250-65 MG tablet Take 1 tablet by mouth daily as needed for headache or migraine. Max 3 doses in one week    [provider]  azelastine  (ASTELIN ) 0.1 % nasal spray Place 2 sprays into both nostrils 2 (two) times daily. Use in each nostril as directed 11/30/23   Okey Burns,  MD  B Complex-C-Folic Acid  (RENA-VITE PO) Take 1 tablet by mouth every morning.    [provider]  blood glucose meter kit and supplies Dispense based on patient and insurance preference. Used to check blood sugar daily, DX: E11.9 OneTouch 12/20/17   Plotnikov, Aleksei V, MD  Blood Glucose Monitoring Suppl (ACCU-CHEK GUIDE ME) w/Device KIT Use as directed to check blood sugar four times daily. 03/12/22   Samtani, Jai-Gurmukh, MD  buPROPion  (WELLBUTRIN  XL) 300 MG 24 hr tablet Take 1 tablet (300 mg total) by mouth in the morning. 10/19/24   Plotnikov, Aleksei V, MD  carboxymethylcellulose (REFRESH PLUS) 0.5 % SOLN Place 1 drop into both eyes 3 (three) times daily as needed (dry eyes/irritation).    [provider]  cholecalciferol (VITAMIN D3) 25 MCG (1000 UNIT) tablet Take by mouth daily. Patient taking differently: Take by mouth daily. 3 times a week    [provider]  cinacalcet  (SENSIPAR ) 30 MG tablet Take 30 mg by mouth Every Tuesday,Thursday,and Saturday with dialysis.    [provider]  cyclobenzaprine  (FLEXERIL ) 10 MG tablet Take 1 tablet (10 mg total) by mouth 3 (three) times daily as needed for muscle spasms. 09/12/24   Plotnikov, Aleksei V, MD  diphenhydramine -acetaminophen  (TYLENOL  PM) 25-500 MG TABS tablet Take 2 tablets by mouth at bedtime as needed (sleep).    [provider]  docusate sodium  (COLACE) 50 MG capsule Take 50 mg  by mouth 2 (two) times daily.    [provider]  FLUoxetine  (PROZAC ) 40 MG capsule Take 1 capsule (40 mg total) by mouth in the morning. 08/26/23     fluticasone  (FLONASE ) 50 MCG/ACT nasal spray Place 2 sprays into both nostrils daily. 11/30/23   Soldatova, Liuba, MD  glimepiride  (AMARYL ) 1 MG tablet Take 1 tablet (1 mg total) by mouth daily with breakfast. 10/19/24   Plotnikov, Karlynn GAILS, MD  glucose blood (ACCU-CHEK GUIDE) test strip Use to check blood sugar up to four times daily. 03/12/22   Samtani, Jai-Gurmukh,  MD  ketoconazole  (NIZORAL ) 2 % cream Apply 1 Application topically daily. 04/04/23   Plotnikov, Aleksei V, MD  lanthanum (FOSRENOL) 1000 MG chewable tablet Chew 1,000-2,000 mg by mouth See admin instructions. Taking 2000 mg with meals and 1000 mg  with snacks    [provider]  lidocaine  (LIDODERM ) 5 % PLACE 2 PATCHES ONTO THE SKIN  DAILY 08/13/22   Plotnikov, Aleksei V, MD  lidocaine  (XYLOCAINE ) 5 % ointment Apply 1 Application topically 3 (three) times daily as needed for moderate pain. 08/22/23   Merlynn Niki FALCON, FNP  LORazepam  (ATIVAN ) 2 MG tablet Take 1 tablet (2 mg total) by mouth 2 (two) times daily as needed for anxiety. 03/14/24     midodrine  (PROAMATINE ) 10 MG tablet Take 3 tablets (30 mg total) by mouth 3 (three) times a week. Take 30 minutes before dialysis and extra tablet during dialysis. 11/21/23   Marlee Bernardino NOVAK, MD  multivitamin-lutein (OCUVITE-LUTEIN) CAPS capsule Take 1 capsule by mouth every morning.    [provider]  naloxone  (NARCAN ) nasal spray 4 mg/0.1 mL Take by nasal route every 3 minutes until patient awakes or EMS arrives. 01/20/22     ondansetron  (ZOFRAN ) 4 MG tablet Take 1 tablet (4 mg total) by mouth every 8 (eight) hours as needed for nausea or vomiting. 06/03/22   Shuford, Randine, PA-C  oxyCODONE  (ROXICODONE ) 15 MG immediate release tablet Take 1 tablet (15 mg total) by mouth 4 (four) times daily as needed. 02/22/24     pantoprazole  (PROTONIX ) 40 MG tablet TAKE 1 TABLET BY MOUTH TWICE  DAILY 09/28/24   Abran Norleen SAILOR, MD  pilocarpine  (SALAGEN ) 5 MG tablet TAKE 1 TABLET BY MOUTH TWICE  DAILY 05/24/24   Plotnikov, Aleksei V, MD  pioglitazone  (ACTOS ) 15 MG tablet Take 1 tablet (15 mg total) by mouth daily. 10/19/24   Plotnikov, Aleksei V, MD  polyethylene glycol (MIRALAX  / GLYCOLAX ) 17 g packet Take 17 g by mouth daily as needed for moderate constipation.    [provider]  PREVIDENT 5000 DRY MOUTH 1.1 % GEL dental gel Place 1 application  onto teeth in  the morning and at bedtime. 10/25/21   [provider]  REPATHA  SURECLICK 140 MG/ML SOAJ INJECT 1 PEN SUBCUTANEOUSLY  EVERY 2 WEEKS 10/24/24   Croitoru, Mihai, MD  Semaglutide ,0.25 or 0.5MG /DOS, (OZEMPIC , 0.25 OR 0.5 MG/DOSE,) 2 MG/3ML SOPN INJECT SUBCUTANEOUSLY 0.5 MG  EVERY WEEK 11/04/24   Plotnikov, Aleksei V, MD  sodium chloride  (OCEAN) 0.65 % SOLN nasal spray Place 1 spray into both nostrils as needed. 05/04/24   Soldatova, Liuba, MD    Allergies: Erythromycin  and Gabapentin    Review of Systems  Updated Vital Signs BP (!) 106/50   Pulse 83   Temp 99.3 F (37.4 C) (Oral)   Resp 17   Ht 5' 1 (1.549 m)   Wt 82.6 kg   SpO2 93%  BMI 34.39 kg/m   Physical Exam Vitals and nursing note reviewed.  Constitutional:      General: She is not in acute distress.    Appearance: She is well-developed.  HENT:     Head: Normocephalic and atraumatic.     Mouth/Throat:     Mouth: Mucous membranes are dry.  Eyes:     Pupils: Pupils are equal, round, and reactive to light.  Cardiovascular:     Rate and Rhythm: Normal rate and regular rhythm.     Heart sounds: Normal heart sounds. No murmur heard.    No friction rub.  Pulmonary:     Effort: Pulmonary effort is normal.     Breath sounds: Normal breath sounds. No wheezing or rales.  Abdominal:     General: Bowel sounds are normal. There is no distension.     Palpations: Abdomen is soft.     Tenderness: There is generalized abdominal tenderness. There is no guarding or rebound. Positive signs include Murphy's sign.     Comments: Patient has diffuse abdominal pain but seems to be most localized in the right upper quadrant  Musculoskeletal:        General: No tenderness. Normal range of motion.     Right lower leg: No edema.     Left lower leg: No edema.     Comments: No edema.  Graft present in the right upper extremity with palpable thrill  Skin:    General: Skin is warm and dry.     Findings: No rash.  Neurological:      Mental Status: She is alert and oriented to person, place, and time. Mental status is at baseline.     Cranial Nerves: No cranial nerve deficit.  Psychiatric:        Mood and Affect: Mood normal.        Behavior: Behavior normal.     (all labs ordered are listed, but only abnormal results are displayed) Labs Reviewed  COMPREHENSIVE METABOLIC PANEL WITH GFR - Abnormal; Notable for the following components:      Result Value   Sodium 132 (*)    Chloride 90 (*)    Glucose, Bld 172 (*)    BUN 41 (*)    Creatinine, Ser 4.26 (*)    Albumin  3.3 (*)    GFR, Estimated 11 (*)    Anion gap 17 (*)    All other components within normal limits  CBC WITH DIFFERENTIAL/PLATELET - Abnormal; Notable for the following components:   WBC 34.9 (*)    RBC 3.55 (*)    Hemoglobin 11.1 (*)    HCT 34.6 (*)    Neutro Abs 32.5 (*)    Basophils Absolute 0.3 (*)    All other components within normal limits  I-STAT CG4 LACTIC ACID, ED - Abnormal; Notable for the following components:   Lactic Acid, Venous 2.8 (*)    All other components within normal limits  RESP PANEL BY RT-PCR (RSV, FLU A&B, COVID)  RVPGX2  CULTURE, BLOOD (ROUTINE X 2)  CULTURE, BLOOD (ROUTINE X 2)  MRSA NEXT GEN BY PCR, NASAL  C DIFFICILE QUICK SCREEN W PCR REFLEX    PROTIME-INR  I-STAT CG4 LACTIC ACID, ED    EKG: EKG Interpretation Date/Time:  Tuesday November 06 2024 15:52:27 EST Ventricular Rate:  96 PR Interval:  166 QRS Duration:  88 QT Interval:  365 QTC Calculation: 462 R Axis:   3  Text Interpretation: Sinus rhythm No significant change since last  tracing Confirmed by Doretha Folks ([id number]) on 11/06/2024 3:59:02 PM  Radiology: CT ABDOMEN PELVIS WO CONTRAST Result Date: 11/06/2024 EXAM: CT ABDOMEN AND PELVIS WITHOUT CONTRAST 11/06/2024 05:40:33 PM TECHNIQUE: CT of the abdomen and pelvis was performed without the administration of intravenous contrast. Multiplanar reformatted images are provided for review.  Automated exposure control, iterative reconstruction, and/or weight-based adjustment of the mA/kV was utilized to reduce the radiation dose to as low as reasonably achievable. COMPARISON: Chest x-ray 11/06/2024, CT chest abdomen pelvis 03/10/2022. CLINICAL HISTORY: Abdominal pain, acute, nonlocalized. FINDINGS: LOWER CHEST: Coronary vascular calcifications. LIVER: The liver is unremarkable. GALLBLADDER AND BILE DUCTS: Distended gallbladder without calcified stone or right upper quadrant inflammation. No biliary ductal dilatation. SPLEEN: No acute abnormality. PANCREAS: Atrophic pancreas without inflammation. ADRENAL GLANDS: No acute abnormality. KIDNEYS, URETERS AND BLADDER: Atrophic kidneys without hydronephrosis. Simple and complex cysts within the bilateral kidneys. Per consensus, no follow-up is needed for simple Bosniak type 1 and 2 renal cysts, unless the patient has a malignancy history or risk factors. No stones in the kidneys or ureters. No perinephric or periureteral stranding. Decompressed urinary bladder. GI AND BOWEL: Stomach demonstrates no acute abnormality. Dense material within the colon. Diverticular disease of the left colon. Circumferential wall thickening of the ascending colon with pericolonic stranding, series 3 image 44. Moderate to large colonic stool burden. No extraluminal gas to suggest a perforation. There is no bowel obstruction. PERITONEUM AND RETROPERITONEUM: No ascites. No free air. VASCULATURE: Vascular structures are limited without intravenous contrast. Aorta is normal in caliber. LYMPH NODES: Few small right lower quadrant mesenteric nodes measuring up to 9 mm. REPRODUCTIVE ORGANS: No acute abnormality. BONES AND SOFT TISSUES: Scoliosis and degenerative changes of the spine. No acute osseous abnormality. Moderate fat-containing umbilical hernia. No focal soft tissue abnormality. IMPRESSION: 1. Circumferential wall thickening of the ascending colon with pericolonic stranding, no  evidence for perforation. Findings could be secondary to focal colitis of infectious, inflammatory, or ischemic etiology; however, inflammatory colonic neoplasm is also a concern. Recommend correlation with colonoscopy. 2. Diverticular disease of the left colon without evidence of diverticulitis. 3. Markedly distended gallbladder without calcified stone or inflammation 4. Atrophic kidneys. Simple and complex cysts within both kidneys, nonemergent follow-up renal CT or MRI could be considered. Electronically signed by: Luke Bun MD 11/06/2024 06:09 PM EST RP Workstation: [id number]   DG Chest Port 1 View Result Date: 11/06/2024 EXAM: 1 VIEW(S) XRAY OF THE CHEST 11/06/2024 04:15:00 PM COMPARISON: 06/07/2023 CLINICAL HISTORY: Questionable sepsis - evaluate for abnormality FINDINGS: LUNGS AND PLEURA: Elevated right hemidiaphragm. No focal pulmonary opacity. No pleural effusion. No pneumothorax. HEART AND MEDIASTINUM: No acute abnormality of the cardiac and mediastinal silhouettes. BONES AND SOFT TISSUES: Right reverse shoulder arthroplasty noted. Asymmetric spurring of the right acromioclavicular joint. Thoracic spondylosis. Dextroconvex thoracic scoliosis. No acute osseous abnormality. IMPRESSION: 1. No acute cardiopulmonary abnormality. 2. Right reverse shoulder arthroplasty. 3. Elevated right hemidiaphragm. 4. Asymmetric degenerative changes of the right acromioclavicular joint. 5. Thoracic spondylosis. 6. Dextroconvex thoracic scoliosis. Electronically signed by: Ryan Salvage MD 11/06/2024 04:42 PM EST RP Workstation: [id number]     Procedures   Medications Ordered in the ED  lactated ringers  infusion (0 mLs Intravenous Stopped 11/06/24 1659)  norepinephrine  (LEVOPHED ) 4mg  in (0.016 mg/mL) premix infusion (2 mcg/min Intravenous New Bag/Given 11/06/24 1818)  Chlorhexidine  Gluconate Cloth 2 % PADS 6 each (has no administration in time range)  docusate sodium  (COLACE) capsule 100 mg (has no  administration in time range)  polyethylene glycol (MIRALAX  /  GLYCOLAX ) packet 17 g (has no administration in time range)  ondansetron  (ZOFRAN ) injection 4 mg (has no administration in time range)  insulin  aspart (novoLOG ) injection 0-9 Units (has no administration in time range)  0.9 %  sodium chloride  infusion (has no administration in time range)  norepinephrine  (LEVOPHED ) 4mg  in (0.016 mg/mL) premix infusion (has no administration in time range)  vancomycin  (VANCOCIN ) capsule 125 mg (has no administration in time range)  lactated ringers  bolus 500 mL (0 mLs Intravenous Stopped 11/06/24 1640)  cefTRIAXone  (ROCEPHIN ) 2 g in sodium chloride  0.9 % 100 mL IVPB (0 g Intravenous Stopped 11/06/24 1648)  metroNIDAZOLE  (FLAGYL ) IVPB 500 mg (0 mg Intravenous Stopped 11/06/24 1712)  vancomycin  (VANCOREADY) IVPB 1750 mg/350 mL (0 mg Intravenous Stopped 11/06/24 1854)  lactated ringers  bolus 2,000 mL (0 mLs Intravenous Stopped 11/06/24 1857)                                    Medical Decision Making Amount and/or Complexity of Data Reviewed Independent Historian: EMS External Data Reviewed: notes. Labs: ordered. Decision-making details documented in ED Course. Radiology: ordered and independent interpretation performed. Decision-making details documented in ED Course. ECG/medicine tests: ordered and independent interpretation performed. Decision-making details documented in ED Course.  Risk Prescription drug management. Decision regarding hospitalization.   Pt with multiple medical problems and comorbidities and presenting today with a complaint that caries a high risk for morbidity and mortality.  Here today meeting sepsis criteria with a temperature of 101.3, soft blood pressure of 95/57 and heart rate of 102.  Seems that patient started feeling unwell yesterday.  She does have abdominal pain and concern for colitis, diverticulitis, perforation, cholecystitis, hepatitis.  Also concern for  bacteremia due to being a dialysis patient.  Lower suspicion for pneumonia and no findings suggestive of cellulitis at this time.  Patient started on sepsis order set.  Will start with 500 mL bolus of fluid until lactate is seen and will follow blood pressures.  Patient is already received Tylenol  for her fever.  She was also covered broadly with Rocephin , Flagyl  and vancomycin  due to the concern for bacteremia. I independently interpreted patient's labs and initial lactate was normal but second 1 is elevated at 2.8, viral panel was negative, CMP with findings consistent with end-stage renal disease but otherwise normal LFTs and total bilirubin, CBC today with a leukocytosis of 34 and stable hemoglobin of 11 with greater than 20% bands and a left shift.  I have independently visualized and interpreted pt's images today.  Chest x-ray without acute findings, CT of the abdomen pelvis without evidence of obstruction or appendicitis however radiology reports circumferential wall thickening of the ascending colon with pericolonic stranding without perforation but concern for a colitis.  No diverticulitis noted.  Gallbladder is distended but does not have any calcified stones or inflammation.  Despite patient's 30/kg bolus she continued to have maps of less than 65 with pressures in the 80s.  She is still in no acute distress and is mentating normally.  Patient was started on Levophed  with improvement in blood pressure.  Consulted critical care for admission.  Findings discussed with the patient and she is agreeable to this plan. CRITICAL CARE Performed by: Makyna Niehoff Total critical care time: 30 minutes Critical care time was exclusive of separately billable procedures and treating other patients. Critical care was necessary to treat or prevent imminent or life-threatening deterioration. Critical  care was time spent personally by me on the following activities: development of treatment plan with patient  and/or surrogate as well as nursing, discussions with consultants, evaluation of patient's response to treatment, examination of patient, obtaining history from patient or surrogate, ordering and performing treatments and interventions, ordering and review of laboratory studies, ordering and review of radiographic studies, pulse oximetry and re-evaluation of patient's condition.        Final diagnoses:  Severe sepsis [facility])  Colitis    ED Discharge Orders     None          Doretha Folks, MD 11/06/24 1907

## 2024-11-06 NOTE — Telephone Encounter (Signed)
 FYI Only or Action Required?: FYI only for provider: appointment scheduled on 11/06/24.  Patient was last seen in primary care on 09/12/2024 by Plotnikov, Karlynn GAILS, MD.  Called Nurse Triage reporting Abdominal Pain.  Symptoms began yesterday.  Interventions attempted: Rest, hydration, or home remedies.  Symptoms are: unchanged.  Triage Disposition: See Physician Within 24 Hours  Patient/caregiver understands and will follow disposition?: Yes   Copied from CRM #[id number]. Topic: Clinical - Red Word Triage >> Nov 06, 2024  9:00 AM Carlyon D wrote: Red Word that prompted transfer to Nurse Triage: Santina to Dialysis yesterday got home started not feeling good. Woke up this morning still having abdominal pain, also woke up with legs and arms hurting. Fever 101.7 in the middle of the night.  Pt is on dialysis and diabetic Reason for Disposition  [1] MODERATE pain (e.g., interferes with normal activities) AND [2] pain comes and goes (cramps) AND [3] present > 24 hours  (Exception: Pain with Vomiting or Diarrhea - see that Guideline.)  Answer Assessment - Initial Assessment Questions Dialysis yesterday began feeling unwell about one hour later with mild generalized abdominal pain and loose stool.     1. LOCATION: Where does it hurt?      Today right sided lower abdomen 2. RADIATION: Does the pain shoot anywhere else? (e.g., chest, back)     denies 3. ONSET: When did the pain begin? (e.g., minutes, hours or days ago)      11/05/24 4. SUDDEN: Gradual or sudden onset?     sudden 5. PATTERN Does the pain come and go, or is it constant?     Intermittent-gassy feeling 6. SEVERITY: How bad is the pain?  (e.g., Scale 1-10; mild, moderate, or severe)     5/10 7. RECURRENT SYMPTOM: Have you ever had this type of stomach pain before? If Yes, ask: When was the last time? and What happened that time?      no 8. CAUSE: What do you think is causing the stomach pain? (e.g.,  gallstones, recent abdominal surgery)     unsure 9. RELIEVING/AGGRAVATING FACTORS: What makes it better or worse? (e.g., antacids, bending or twisting motion, bowel movement)     unidentified 10. OTHER SYMPTOMS: Do you have any other symptoms? (e.g., back pain, diarrhea, fever, urination pain, vomiting)       Loose stool, fever 99.7 was 101.7 overnight. Bilateral arm and leg pain. Poor appetite 11. PREGNANCY: Is there any chance you are pregnant? When was your last menstrual period?  Protocols used: Abdominal Pain - Female-A-AH

## 2024-11-06 NOTE — Sepsis Progress Note (Signed)
 Notified provider and bedside nurse of need to order and draw repeat lactic acid #3 at 2000.

## 2024-11-06 NOTE — ED Notes (Signed)
EDP notified of hypotension 

## 2024-11-06 NOTE — ED Notes (Signed)
 Patient transported to CT

## 2024-11-06 NOTE — Progress Notes (Signed)
 Acute Office Visit  Subjective:     Patient ID: [name], female    DOB: August 24, 1951, 73 y.o.   MRN: [id number]  Chief Complaint  Patient presents with   Abdominal Pain    Abdominal pain, loose stool and fever onset yesterday after dialysis     HPI  Discussed the use of AI scribe software for clinical note transcription with the patient, who gave verbal consent to proceed.  History of Present Illness [name] is a 73 year old female with end-stage renal disease on dialysis who presents with fever and abdominal pain.  Fever - Fever began after returning home from dialysis yesterday - Fever episodes make fluid restriction of 32 ounces per day challenging  Abdominal pain - Severe abdominal pain began after dialysis yesterday - Pain was diffuse across the upper abdomen yesterday - Pain is localized to the right side today - History of similar episodes resulting in hospitalizations for intestinal infections - Discomfort and sensation of needing a bowel movement often occur during dialysis - Took ibuprofen this morning for pain  Altered bowel habits - Loose bowel movements followed firm stools after dialysis yesterday - Frequent bathroom trips yesterday due to diarrhea - No bowel movements today despite previous day's diarrhea  End-stage renal disease and dialysis - End-stage renal disease managed with dialysis - Fluid restriction of 32 ounces per day - Currently taking a phosphorus blocker - Previous complications from other medications included significant blood pressure drops during dialysis  Infection prevention measures - Received COVID and influenza vaccines - Consistently wears a mask - Lives alone in a four-story building and is cautious about her health     ROS Per HPI      Objective:    BP 130/68 (BP Location: Left Arm, Patient Position: Sitting, Cuff Size: Normal)   Pulse (!) 106   Temp (!) 102.7 F (39.3 C) (Oral)   Ht 5' 1 (1.549 m)    Wt 182 lb (82.6 kg)   SpO2 97%   BMI 34.39 kg/m    Physical Exam Vitals and nursing note reviewed.  Constitutional:      General: She is not in acute distress.    Appearance: She is ill-appearing.     Comments: Febrile  HENT:     Head: Normocephalic and atraumatic.     Right Ear: External ear normal.     Left Ear: External ear normal.     Nose: Nose normal.     Mouth/Throat:     Mouth: Mucous membranes are dry.  Eyes:     Extraocular Movements: Extraocular movements intact.     Pupils: Pupils are equal, round, and reactive to light.  Cardiovascular:     Rate and Rhythm: Regular rhythm. Tachycardia present.     Pulses: Normal pulses.     Heart sounds: Normal heart sounds.  Pulmonary:     Effort: Pulmonary effort is normal. No respiratory distress.     Breath sounds: Normal breath sounds. No wheezing, rhonchi or rales.     Comments: Mild tachypnea Abdominal:     Tenderness: There is abdominal tenderness (LUQ tenderness).  Musculoskeletal:     Cervical back: Normal range of motion.     Right lower leg: No edema.     Left lower leg: No edema.     Comments: Using cane  Lymphadenopathy:     Cervical: No cervical adenopathy.  Neurological:     General: No focal deficit present.  Mental Status: She is alert and oriented to person, place, and time.  Psychiatric:        Mood and Affect: Mood normal.        Thought Content: Thought content normal.     Results for orders placed or performed during the hospital encounter of 11/06/24  Resp panel by RT-PCR (RSV, Flu A&B, Covid) Anterior Nasal Swab   Specimen: Anterior Nasal Swab  Result Value Ref Range   SARS Coronavirus 2 by RT PCR NEGATIVE NEGATIVE   Influenza A by PCR NEGATIVE NEGATIVE   Influenza B by PCR NEGATIVE NEGATIVE   Resp Syncytial Virus by PCR NEGATIVE NEGATIVE  Blood Culture (routine x 2)   Specimen: BLOOD  Result Value Ref Range   Specimen Description BLOOD SITE NOT SPECIFIED    Special Requests       BOTTLES DRAWN AEROBIC AND ANAEROBIC Blood Culture adequate volume   Culture      NO GROWTH 5 DAYS Performed at [facility], [address] [place], [place] [address]    Report Status 11/11/2024 FINAL   Blood Culture (routine x 2)   Specimen: BLOOD  Result Value Ref Range   Specimen Description BLOOD SITE NOT SPECIFIED    Special Requests      BOTTLES DRAWN AEROBIC AND ANAEROBIC Blood Culture results may not be optimal due to an inadequate volume of blood received in culture bottles   Culture      NO GROWTH 5 DAYS Performed at [facility], [address], [place], [place] [address]    Report Status 11/11/2024 FINAL   MRSA Next Gen by PCR, Nasal   Specimen: Nasal Mucosa; Nasal Swab  Result Value Ref Range   MRSA by PCR Next Gen NOT DETECTED NOT DETECTED  Gastrointestinal Panel by PCR , Stool   Specimen: Stool  Result Value Ref Range   Campylobacter species NOT DETECTED NOT DETECTED   Plesimonas shigelloides NOT DETECTED NOT DETECTED   Salmonella species NOT DETECTED NOT DETECTED   Yersinia enterocolitica NOT DETECTED NOT DETECTED   Vibrio species NOT DETECTED NOT DETECTED   Vibrio cholerae NOT DETECTED NOT DETECTED   Enteroaggregative E coli (EAEC) NOT DETECTED NOT DETECTED   Enteropathogenic E coli (EPEC) NOT DETECTED NOT DETECTED   Enterotoxigenic E coli (ETEC) NOT DETECTED NOT DETECTED   Shiga like toxin producing E coli (STEC) NOT DETECTED NOT DETECTED   Shigella/Enteroinvasive E coli (EIEC) NOT DETECTED NOT DETECTED   Cryptosporidium NOT DETECTED NOT DETECTED   Cyclospora cayetanensis NOT DETECTED NOT DETECTED   Entamoeba histolytica NOT DETECTED NOT DETECTED   Giardia lamblia NOT DETECTED NOT DETECTED   Adenovirus F40/41 NOT DETECTED NOT DETECTED   Astrovirus NOT DETECTED NOT DETECTED   Norovirus GI/GII NOT DETECTED NOT DETECTED   Rotavirus A NOT DETECTED NOT DETECTED   Sapovirus (I, II, IV, and V) NOT DETECTED NOT DETECTED  Comprehensive metabolic panel   Result Value Ref Range   Sodium 132 (L) 135 - 145 mmol/L   Potassium 4.4 3.5 - 5.1 mmol/L   Chloride 90 (L) 98 - 111 mmol/L   CO2 25 22 - 32 mmol/L   Glucose, Bld 172 (H) 70 - 99 mg/dL   BUN 41 (H) 8 - 23 mg/dL   Creatinine, Ser 5.73 (H) 0.44 - 1.00 mg/dL   Calcium 9.3 8.9 - 89.6 mg/dL   Total Protein 6.8 6.5 - 8.1 g/dL   Albumin  3.3 (L) 3.5 - 5.0 g/dL   AST  23 15 - 41 U/L   ALT 20 0 - 44 U/L   Alkaline Phosphatase 102 38 - 126 U/L   Total Bilirubin 0.9 0.0 - 1.2 mg/dL   GFR, Estimated 11 (L) >60 mL/min   Anion gap 17 (H) 5 - 15  CBC with Differential  Result Value Ref Range   WBC 34.9 (H) 4.0 - 10.5 K/uL   RBC 3.55 (L) 3.87 - 5.11 MIL/uL   Hemoglobin 11.1 (L) 12.0 - 15.0 g/dL   HCT 65.3 (L) 63.9 - 53.9 %   MCV 97.5 80.0 - 100.0 fL   MCH 31.3 26.0 - 34.0 pg   MCHC 32.1 30.0 - 36.0 g/dL   RDW 85.2 88.4 - 84.4 %   Platelets 216 150 - 400 K/uL   nRBC 0.0 0.0 - 0.2 %   Neutrophils Relative % 93 %   Neutro Abs 32.5 (H) 1.7 - 7.7 K/uL   Lymphocytes Relative 3 %   Lymphs Abs 1.0 0.7 - 4.0 K/uL   Monocytes Relative 3 %   Monocytes Absolute 1.0 0.1 - 1.0 K/uL   Eosinophils Relative 0 %   Eosinophils Absolute 0.0 0.0 - 0.5 K/uL   Basophils Relative 1 %   Basophils Absolute 0.3 (H) 0.0 - 0.1 K/uL   WBC Morphology See Note    RBC Morphology MORPHOLOGY UNREMARKABLE    Smear Review See Note   Protime-INR  Result Value Ref Range   Prothrombin Time 15.0 11.4 - 15.2 seconds   INR 1.1 0.8 - 1.2  Lactic acid, plasma  Result Value Ref Range   Lactic Acid, Venous 1.8 0.5 - 1.9 mmol/L  Lactic acid, plasma  Result Value Ref Range   Lactic Acid, Venous 1.5 0.5 - 1.9 mmol/L  Lactic acid, plasma  Result Value Ref Range   Lactic Acid, Venous 1.4 0.5 - 1.9 mmol/L  CBC  Result Value Ref Range   WBC 27.4 (H) 4.0 - 10.5 K/uL   RBC 2.93 (L) 3.87 - 5.11 MIL/uL   Hemoglobin 9.1 (L) 12.0 - 15.0 g/dL   HCT 71.0 (L) 63.9 - 53.9 %   MCV 98.6 80.0 - 100.0 fL   MCH 31.1 26.0 - 34.0 pg   MCHC  31.5 30.0 - 36.0 g/dL   RDW 85.2 88.4 - 84.4 %   Platelets 157 150 - 400 K/uL   nRBC 0.0 0.0 - 0.2 %  Basic metabolic panel with GFR  Result Value Ref Range   Sodium 132 (L) 135 - 145 mmol/L   Potassium 4.0 3.5 - 5.1 mmol/L   Chloride 92 (L) 98 - 111 mmol/L   CO2 22 22 - 32 mmol/L   Glucose, Bld 81 70 - 99 mg/dL   BUN 46 (H) 8 - 23 mg/dL   Creatinine, Ser 5.64 (H) 0.44 - 1.00 mg/dL   Calcium 8.5 (L) 8.9 - 10.3 mg/dL   GFR, Estimated 10 (L) >60 mL/min   Anion gap 18 (H) 5 - 15  Glucose, capillary  Result Value Ref Range   Glucose-Capillary 144 (H) 70 - 99 mg/dL   Comment 1 Notify RN   Glucose, capillary  Result Value Ref Range   Glucose-Capillary 206 (H) 70 - 99 mg/dL   Comment 1 Notify RN   Glucose, capillary  Result Value Ref Range   Glucose-Capillary 112 (H) 70 - 99 mg/dL   Comment 1 Notify RN   Lactic acid, plasma  Result Value Ref Range   Lactic Acid, Venous 1.4 0.5 -  1.9 mmol/L  Glucose, capillary  Result Value Ref Range   Glucose-Capillary 44 (LL) 70 - 99 mg/dL   Comment 1 Notify RN   Glucose, capillary  Result Value Ref Range   Glucose-Capillary 48 (L) 70 - 99 mg/dL  Glucose, capillary  Result Value Ref Range   Glucose-Capillary 63 (L) 70 - 99 mg/dL  Glucose, capillary  Result Value Ref Range   Glucose-Capillary 97 70 - 99 mg/dL  Glucose, capillary  Result Value Ref Range   Glucose-Capillary 118 (H) 70 - 99 mg/dL  Blood gas, venous  Result Value Ref Range   pH, Ven 7.36 7.25 - 7.43   pCO2, Ven 43 (L) 44 - 60 mmHg   pO2, Ven 45 32 - 45 mmHg   Bicarbonate 24.3 20.0 - 28.0 mmol/L   Acid-base deficit 1.3 0.0 - 2.0 mmol/L   O2 Saturation 76 %   Patient temperature 36.9    Collection site L HAND    Drawn by [id number]   Glucose, capillary  Result Value Ref Range   Glucose-Capillary 118 (H) 70 - 99 mg/dL  CBC  Result Value Ref Range   WBC 18.3 (H) 4.0 - 10.5 K/uL   RBC 3.06 (L) 3.87 - 5.11 MIL/uL   Hemoglobin 9.5 (L) 12.0 - 15.0 g/dL   HCT 70.5 (L) 63.9 -  46.0 %   MCV 96.1 80.0 - 100.0 fL   MCH 31.0 26.0 - 34.0 pg   MCHC 32.3 30.0 - 36.0 g/dL   RDW 85.5 88.4 - 84.4 %   Platelets 186 150 - 400 K/uL   nRBC 0.0 0.0 - 0.2 %  Basic metabolic panel with GFR  Result Value Ref Range   Sodium 130 (L) 135 - 145 mmol/L   Potassium 4.6 3.5 - 5.1 mmol/L   Chloride 92 (L) 98 - 111 mmol/L   CO2 22 22 - 32 mmol/L   Glucose, Bld 102 (H) 70 - 99 mg/dL   BUN 57 (H) 8 - 23 mg/dL   Creatinine, Ser 4.61 (H) 0.44 - 1.00 mg/dL   Calcium 8.3 (L) 8.9 - 10.3 mg/dL   GFR, Estimated 8 (L) >60 mL/min   Anion gap 16 (H) 5 - 15  Glucose, capillary  Result Value Ref Range   Glucose-Capillary 122 (H) 70 - 99 mg/dL   Comment 1 Notify RN   Glucose, capillary  Result Value Ref Range   Glucose-Capillary 105 (H) 70 - 99 mg/dL   Comment 1 Notify RN   Glucose, capillary  Result Value Ref Range   Glucose-Capillary 112 (H) 70 - 99 mg/dL   Comment 1 Notify RN   Glucose, capillary  Result Value Ref Range   Glucose-Capillary 112 (H) 70 - 99 mg/dL  Hepatitis B surface antigen  Result Value Ref Range   Hepatitis B Surface Ag NON REACTIVE NON REACTIVE  Hepatitis B surface antibody,quantitative  Result Value Ref Range   Hep B S AB Quant (Post) 29.8 Immunity>10 mIU/mL  Glucose, capillary  Result Value Ref Range   Glucose-Capillary 150 (H) 70 - 99 mg/dL  Glucose, capillary  Result Value Ref Range   Glucose-Capillary 126 (H) 70 - 99 mg/dL  CBC  Result Value Ref Range   WBC 10.7 (H) 4.0 - 10.5 K/uL   RBC 3.11 (L) 3.87 - 5.11 MIL/uL   Hemoglobin 9.7 (L) 12.0 - 15.0 g/dL   HCT 69.1 (L) 63.9 - 53.9 %   MCV 99.0 80.0 - 100.0 fL   MCH 31.2 26.0 -  34.0 pg   MCHC 31.5 30.0 - 36.0 g/dL   RDW 85.3 88.4 - 84.4 %   Platelets 162 150 - 400 K/uL   nRBC 0.0 0.0 - 0.2 %  Basic metabolic panel with GFR  Result Value Ref Range   Sodium 128 (L) 135 - 145 mmol/L   Potassium 4.8 3.5 - 5.1 mmol/L   Chloride 88 (L) 98 - 111 mmol/L   CO2 16 (L) 22 - 32 mmol/L   Glucose, Bld 56  (L) 70 - 99 mg/dL   BUN 68 (H) 8 - 23 mg/dL   Creatinine, Ser 3.98 (H) 0.44 - 1.00 mg/dL   Calcium 8.1 (L) 8.9 - 10.3 mg/dL   GFR, Estimated 7 (L) >60 mL/min   Anion gap 24 (H) 5 - 15  Glucose, capillary  Result Value Ref Range   Glucose-Capillary 116 (H) 70 - 99 mg/dL  Glucose, capillary  Result Value Ref Range   Glucose-Capillary 73 70 - 99 mg/dL  Glucose, capillary  Result Value Ref Range   Glucose-Capillary 52 (L) 70 - 99 mg/dL  Glucose, capillary  Result Value Ref Range   Glucose-Capillary 49 (L) 70 - 99 mg/dL  Glucose, capillary  Result Value Ref Range   Glucose-Capillary 76 70 - 99 mg/dL  Glucose, capillary  Result Value Ref Range   Glucose-Capillary 97 70 - 99 mg/dL  Glucose, capillary  Result Value Ref Range   Glucose-Capillary 86 70 - 99 mg/dL  Glucose, capillary  Result Value Ref Range   Glucose-Capillary 89 70 - 99 mg/dL  CBC  Result Value Ref Range   WBC 7.8 4.0 - 10.5 K/uL   RBC 2.97 (L) 3.87 - 5.11 MIL/uL   Hemoglobin 9.2 (L) 12.0 - 15.0 g/dL   HCT 71.4 (L) 63.9 - 53.9 %   MCV 96.0 80.0 - 100.0 fL   MCH 31.0 26.0 - 34.0 pg   MCHC 32.3 30.0 - 36.0 g/dL   RDW 85.5 88.4 - 84.4 %   Platelets 181 150 - 400 K/uL   nRBC 0.0 0.0 - 0.2 %  Basic metabolic panel with GFR  Result Value Ref Range   Sodium 131 (L) 135 - 145 mmol/L   Potassium 4.0 3.5 - 5.1 mmol/L   Chloride 92 (L) 98 - 111 mmol/L   CO2 22 22 - 32 mmol/L   Glucose, Bld 66 (L) 70 - 99 mg/dL   BUN 35 (H) 8 - 23 mg/dL   Creatinine, Ser 5.46 (H) 0.44 - 1.00 mg/dL   Calcium 8.5 (L) 8.9 - 10.3 mg/dL   GFR, Estimated 10 (L) >60 mL/min   Anion gap 17 (H) 5 - 15  CBC  Result Value Ref Range   WBC 7.4 4.0 - 10.5 K/uL   RBC 2.81 (L) 3.87 - 5.11 MIL/uL   Hemoglobin 8.7 (L) 12.0 - 15.0 g/dL   HCT 72.4 (L) 63.9 - 53.9 %   MCV 97.9 80.0 - 100.0 fL   MCH 31.0 26.0 - 34.0 pg   MCHC 31.6 30.0 - 36.0 g/dL   RDW 85.5 88.4 - 84.4 %   Platelets 205 150 - 400 K/uL   nRBC 0.0 0.0 - 0.2 %  Basic metabolic  panel with GFR  Result Value Ref Range   Sodium 129 (L) 135 - 145 mmol/L   Potassium 3.7 3.5 - 5.1 mmol/L   Chloride 91 (L) 98 - 111 mmol/L   CO2 20 (L) 22 - 32 mmol/L   Glucose, Bld 107 (H)  70 - 99 mg/dL   BUN 43 (H) 8 - 23 mg/dL   Creatinine, Ser 4.13 (H) 0.44 - 1.00 mg/dL   Calcium 8.6 (L) 8.9 - 10.3 mg/dL   GFR, Estimated 7 (L) >60 mL/min   Anion gap 18 (H) 5 - 15  I-Stat Lactic Acid, ED  Result Value Ref Range   Lactic Acid, Venous 1.9 0.5 - 1.9 mmol/L  I-Stat Lactic Acid, ED  Result Value Ref Range   Lactic Acid, Venous 2.8 (HH) 0.5 - 1.9 mmol/L   Comment NOTIFIED PHYSICIAN   Results for orders placed or performed in visit on 11/06/24  POC COVID-19  Result Value Ref Range   SARS Coronavirus 2 Ag Negative Negative  POC Influenza A&B (Binax test)  Result Value Ref Range   Influenza A, POC Negative Negative   Influenza B, POC Negative Negative        Assessment & Plan:   Assessment and Plan Assessment & Plan Fever and acute diarrhea with abdominal pain, suspect infection Fever, diarrhea, and right-sided abdominal pain post-dialysis suggest possible gastrointestinal infection. Risk of dehydration due to fluid loss and fluid restrictions. - Checked COVID and flu tests. - Arranged transportation to hospital for further evaluation and management.  End-stage renal disease on dialysis Chronic dialysis with fluid restriction. Recent symptoms increase dehydration risk. - Monitor fluid intake and output closely. - Ensure adherence to fluid restrictions.  COVID and flu testing were negative To ER via EMS for further evaluation and management of acute illness, concern for sepsis     Orders Placed This Encounter  Procedures   POC COVID-19   POC Influenza A&B (Binax test)     No orders of the defined types were placed in this encounter.   Return if symptoms worsen or fail to improve.  Corean LITTIE Ku, FNP

## 2024-11-06 NOTE — H&P (Signed)
 NAME:  [name], MRN:  [id number], DOB:  [birth year], LOS: 0 ADMISSION DATE:  11/06/2024, CONSULTATION DATE:  11/06/24 REFERRING MD:  EDP, CHIEF COMPLAINT:  febrile   History of Present Illness:  56 year woman w/ hx DM, HTN, GERD, ESRD p/w abd pain, diarrhea, fevers x 2 days.  Hypotensive refractory to fluids, PCCM consulted.  Scan showing circumferential colonic inflammation/thickening colitis vs. Neoplasm.  Pertinent  Medical History   Past Medical History:  Diagnosis Date   Anemia    Anxiety    Brain tumor (benign) ([name])    Colitis 2010   microscopic- Dr Abran   Depression    Diabetes mellitus    type II   Dyspnea    with exertion   ESRD (end stage renal disease) ([name])    TTUSAT [address]    Fibromyalgia    GERD (gastroesophageal reflux disease)    Headache    History of blood transfusion    after knee surgery   Hypertension    discontinued all diuretics and antihypertensives   IBS (irritable bowel syndrome)    LBP (low back pain)    Neuropathy    feet bilat    Osteoarthritis    Osteopenia    Pneumonia    hx of 2014    Rotator cuff tear, right    Sinusitis    currently being treated with antibiotic will complete 03/04/2015     Significant Hospital Events: Including procedures, antibiotic start and stop dates in addition to other pertinent events   11/25 admit  Interim History / Subjective:  admit  Objective    Blood pressure (!) 106/50, pulse 83, temperature 99.3 F (37.4 C), temperature source Oral, resp. rate 17, height 5' 1 (1.549 m), weight 82.6 kg, SpO2 93%.        Intake/Output Summary (Last 24 hours) at 11/06/2024 1905 Last data filed at 11/06/2024 1857 Gross per 24 hour  Intake 3043.65 ml  Output --  Net 3043.65 ml   Filed Weights   11/06/24 1609  Weight: 82.6 kg    Examination: General: non toxic appearing HENT: MMM, trachea midline Lungs: clear, no wheezing Cardiovascular: regular, ext warm Abdomen: soft, mild  ttp Extremities: no edema Neuro: moves to command Skin: no rashes  Imaging/labs personally reviewed Lactate initially normal now up a bit  Resolved problem list   Assessment and Plan  Septic shock secondary to colitis ESRD on HD Hx HTN, DM  - Levo for MAP 65 - Empiric bacteremia, colitis, and C diff coverage - Check for C diff - f/u blood cultures - SSI - Hold anti-glycemics and antihypertensives - trend lactate, consideration for surgical consultation if keeps going up and/or develops peritoneal signs - Patient DNR in event of cardiac arrest but otherwise full scope  Labs   CBC: Recent Labs  Lab 11/06/24 1559  WBC 34.9*  NEUTROABS 32.5*  HGB 11.1*  HCT 34.6*  MCV 97.5  PLT 216    Basic Metabolic Panel: Recent Labs  Lab 11/06/24 1559  NA 132*  K 4.4  CL 90*  CO2 25  GLUCOSE 172*  BUN 41*  CREATININE 4.26*  CALCIUM 9.3   GFR: Estimated Creatinine Clearance: 11.6 mL/min (A) (by C-G formula based on SCr of 4.26 mg/dL (H)). Recent Labs  Lab 11/06/24 1559 11/06/24 1606 11/06/24 1756  WBC 34.9*  --   --   LATICACIDVEN  --  1.9 2.8*    Liver Function Tests: Recent Labs  Lab 11/06/24 1559  AST 23  ALT 20  ALKPHOS 102  BILITOT 0.9  PROT 6.8  ALBUMIN  3.3*   No results for input(s): LIPASE, AMYLASE in the last 168 hours. No results for input(s): AMMONIA in the last 168 hours.  ABG    Component Value Date/Time   TCO2 28 10/27/2020 1109     Coagulation Profile: Recent Labs  Lab 11/06/24 1559  INR 1.1    Cardiac Enzymes: No results for input(s): CKTOTAL, CKMB, CKMBINDEX, TROPONINI in the last 168 hours.  HbA1C: Hemoglobin A1C  Date/Time Value Ref Range Status  09/12/2024 04:10 PM 6.1 (A) 4.0 - 5.6 % Final  06/13/2024 08:12 AM 6.3 (A) 4.0 - 5.6 % Final   Hgb A1c MFr Bld  Date/Time Value Ref Range Status  08/22/2023 12:20 PM 6.5 4.6 - 6.5 % Final    Comment:    Glycemic Control Guidelines for People with Diabetes:Non  Diabetic:  <6%Goal of Therapy: <7%Additional Action Suggested:  >8%   03/10/2022 07:22 AM 6.3 (H) 4.8 - 5.6 % Final    Comment:    (NOTE) Pre diabetes:          5.7%-6.4%  Diabetes:              >6.4%  Glycemic control for   <7.0% adults with diabetes     CBG: No results for input(s): GLUCAP in the last 168 hours.  Review of Systems:    Positive Symptoms in bold:  Constitutional fevers, chills, weight loss, fatigue, anorexia, malaise  Eyes decreased vision, double vision, eye irritation  Ears, Nose, Mouth, Throat sore throat, trouble swallowing, sinus congestion  Cardiovascular chest pain, paroxysmal nocturnal dyspnea, lower ext edema, palpitations   Respiratory SOB, cough, DOE, hemoptysis, wheezing  Gastrointestinal nausea, vomiting, diarrhea  Genitourinary burning with urination, trouble urinating  Musculoskeletal joint aches, joint swelling, back pain  Integumentary  rashes, skin lesions  Neurological focal weakness, focal numbness, trouble speaking, headaches  Psychiatric depression, anxiety, confusion  Endocrine polyuria, polydipsia, cold intolerance, heat intolerance  Hematologic abnormal bruising, abnormal bleeding, unexplained nose bleeds  Allergic/Immunologic recurrent infections, hives, swollen lymph nodes     Past Medical History:  She,  has a past medical history of Anemia, Anxiety, Brain tumor (benign) ([name]), Colitis (2010), Depression, Diabetes mellitus, Dyspnea, ESRD (end stage renal disease) ([name]), Fibromyalgia, GERD (gastroesophageal reflux disease), Headache, History of blood transfusion, Hypertension, IBS (irritable bowel syndrome), LBP (low back pain), Neuropathy, Osteoarthritis, Osteopenia, Pneumonia, Rotator cuff tear, right, and Sinusitis.   Surgical History:   Past Surgical History:  Procedure Laterality Date   A/V FISTULAGRAM N/A 02/03/2024   Procedure: A/V Fistulagram;  Surgeon: Norine Manuelita LABOR, MD;  Location: [facility] INVASIVE CV LAB;  Service:  Cardiovascular;  Laterality: N/A;   A/V SHUNT INTERVENTION Right 05/30/2024   Procedure: A/V SHUNT INTERVENTION;  Surgeon: Pearline Norman RAMAN, MD;  Location: [facility] PV LAB;  Service: Cardiovascular;  Laterality: Right;   A/V SHUNT INTERVENTION N/A 09/07/2024   Procedure: A/V SHUNT INTERVENTION;  Surgeon: Lanis Fonda BRAVO, MD;  Location: [facility] PV LAB;  Service: Cardiovascular;  Laterality: N/A;   AV FISTULA PLACEMENT Right 09/12/2020   Procedure: RIGHT ARTERIOVENOUS (AV) FISTULA CREATION;  Surgeon: Harvey Carlin BRAVO, MD;  Location: [facility] OR;  Service: Vascular;  Laterality: Right;   BASCILIC VEIN TRANSPOSITION Right 10/27/2020   Procedure: RIGHT UPPER EXTREMITY SECOND STAGE BASCILIC VEIN TRANSPOSITION;  Surgeon: Harvey Carlin BRAVO, MD;  Location: [facility] OR;  Service: Vascular;  Laterality: Right;   BIOPSY  03/11/2022  Procedure: BIOPSY;  Surgeon: Wilhelmenia Aloha Raddle., MD;  Location: [facility] ENDOSCOPY;  Service: Gastroenterology;;   COLONOSCOPY WITH PROPOFOL  N/A 03/11/2022   Procedure: COLONOSCOPY WITH PROPOFOL ;  Surgeon: Wilhelmenia Aloha Raddle., MD;  Location: [facility] ENDOSCOPY;  Service: Gastroenterology;  Laterality: N/A;   foramen magnum ependymoma surgery  2003   Dr Mora   JOINT REPLACEMENT Bilateral    NASAL SINUS SURGERY     1973    PERIPHERAL VASCULAR BALLOON ANGIOPLASTY  02/03/2024   Procedure: PERIPHERAL VASCULAR BALLOON ANGIOPLASTY;  Surgeon: Norine Manuelita LABOR, MD;  Location: [facility] INVASIVE CV LAB;  Service: Cardiovascular;;  Inflow Basilic Vein   REVERSE SHOULDER ARTHROPLASTY Right 06/03/2022   Procedure: REVERSE SHOULDER ARTHROPLASTY;  Surgeon: Melita Drivers, MD;  Location: [facility] ORS;  Service: Orthopedics;  Laterality: Right;    TONSILLECTOMY     TOTAL KNEE ARTHROPLASTY     L 2008, R 2009, R 2016- Dr hiram   TOTAL KNEE REVISION Right 03/05/2015   Procedure: RIGHT TOTAL KNEE ARTHROPLASTY REVISION;  Surgeon: Dempsey Moan, MD;  Location: [facility] ORS;  Service: Orthopedics;  Laterality: Right;   VENOUS  ANGIOPLASTY  05/30/2024   Procedure: VENOUS ANGIOPLASTY;  Surgeon: Pearline Norman RAMAN, MD;  Location: [facility] PV LAB;  Service: Cardiovascular;;  central   VENOUS ANGIOPLASTY  09/07/2024   Procedure: VENOUS ANGIOPLASTY;  Surgeon: Lanis Fonda BRAVO, MD;  Location: [facility] PV LAB;  Service: Cardiovascular;;  innominate 70%     Social History:   reports that she quit smoking about 35 years ago. Her smoking use included cigarettes. She started smoking about 55 years ago. She has a 20 pack-year smoking history. She has never used smokeless tobacco. She reports that she does not currently use alcohol . She reports that she does not use drugs.   Family History:  Her family history includes Crohn's disease in her maternal uncle; Depression in her mother; Diabetes in her brother, brother, father, mother, and another family member; Heart attack in her brother; Heart disease in her brother and brother; Hyperlipidemia in her brother, brother, and father; Hypertension in her brother, brother, father, and mother; Prostate cancer in her brother; Stroke in her brother; Stroke (age of onset: 28) in her mother. There is no history of Colon cancer, Esophageal cancer, Stomach cancer, Pancreatic cancer, Breast cancer, or BRCA 1/2.   Allergies Allergies  Allergen Reactions   Erythromycin  Diarrhea and Nausea And Vomiting   Gabapentin Other (See Comments)    Confusion and falling  Hallucinations     Home Medications  Prior to Admission medications   Medication Sig Start Date End Date Taking? Authorizing Provider  Accu-Chek Softclix Lancets lancets Use 1 lancet up to 4 times daily as directed 03/12/22   Samtani, Jai-Gurmukh, MD  acetaminophen  (TYLENOL ) 500 MG tablet Take 1,000 mg by mouth 2 (two) times daily as needed for headache.    [provider]  aspirin -acetaminophen -caffeine (EXCEDRIN MIGRAINE) 250-250-65 MG tablet Take 1 tablet by mouth daily as needed for headache or migraine. Max 3 doses in one week     [provider]  azelastine  (ASTELIN ) 0.1 % nasal spray Place 2 sprays into both nostrils 2 (two) times daily. Use in each nostril as directed 11/30/23   Okey Burns, MD  B Complex-C-Folic Acid  (RENA-VITE PO) Take 1 tablet by mouth every morning.    [provider]  blood glucose meter kit and supplies Dispense based on patient and insurance preference. Used to check blood sugar daily, DX: E11.9 OneTouch 12/20/17   Plotnikov,  Karlynn GAILS, MD  Blood Glucose Monitoring Suppl (ACCU-CHEK GUIDE ME) w/Device KIT Use as directed to check blood sugar four times daily. 03/12/22   Samtani, Jai-Gurmukh, MD  buPROPion  (WELLBUTRIN  XL) 300 MG 24 hr tablet Take 1 tablet (300 mg total) by mouth in the morning. 10/19/24   Plotnikov, Aleksei V, MD  carboxymethylcellulose (REFRESH PLUS) 0.5 % SOLN Place 1 drop into both eyes 3 (three) times daily as needed (dry eyes/irritation).    [provider]  cholecalciferol (VITAMIN D3) 25 MCG (1000 UNIT) tablet Take by mouth daily. Patient taking differently: Take by mouth daily. 3 times a week    [provider]  cinacalcet  (SENSIPAR ) 30 MG tablet Take 30 mg by mouth Every Tuesday,Thursday,and Saturday with dialysis.    [provider]  cyclobenzaprine  (FLEXERIL ) 10 MG tablet Take 1 tablet (10 mg total) by mouth 3 (three) times daily as needed for muscle spasms. 09/12/24   Plotnikov, Aleksei V, MD  diphenhydramine -acetaminophen  (TYLENOL  PM) 25-500 MG TABS tablet Take 2 tablets by mouth at bedtime as needed (sleep).    [provider]  docusate sodium  (COLACE) 50 MG capsule Take 50 mg by mouth 2 (two) times daily.    [provider]  FLUoxetine  (PROZAC ) 40 MG capsule Take 1 capsule (40 mg total) by mouth in the morning. 08/26/23     fluticasone  (FLONASE ) 50 MCG/ACT nasal spray Place 2 sprays into both nostrils daily. 11/30/23   Soldatova, Liuba, MD  glimepiride  (AMARYL ) 1 MG tablet Take 1 tablet (1 mg total) by mouth  daily with breakfast. 10/19/24   Plotnikov, Karlynn GAILS, MD  glucose blood (ACCU-CHEK GUIDE) test strip Use to check blood sugar up to four times daily. 03/12/22   Samtani, Jai-Gurmukh, MD  ketoconazole  (NIZORAL ) 2 % cream Apply 1 Application topically daily. 04/04/23   Plotnikov, Aleksei V, MD  lanthanum (FOSRENOL) 1000 MG chewable tablet Chew 1,000-2,000 mg by mouth See admin instructions. Taking 2000 mg with meals and 1000 mg  with snacks    [provider]  lidocaine  (LIDODERM ) 5 % PLACE 2 PATCHES ONTO THE SKIN  DAILY 08/13/22   Plotnikov, Aleksei V, MD  lidocaine  (XYLOCAINE ) 5 % ointment Apply 1 Application topically 3 (three) times daily as needed for moderate pain. 08/22/23   Merlynn Niki FALCON, FNP  LORazepam  (ATIVAN ) 2 MG tablet Take 1 tablet (2 mg total) by mouth 2 (two) times daily as needed for anxiety. 03/14/24     midodrine  (PROAMATINE ) 10 MG tablet Take 3 tablets (30 mg total) by mouth 3 (three) times a week. Take 30 minutes before dialysis and extra tablet during dialysis. 11/21/23   Marlee Bernardino NOVAK, MD  multivitamin-lutein (OCUVITE-LUTEIN) CAPS capsule Take 1 capsule by mouth every morning.    [provider]  naloxone  (NARCAN ) nasal spray 4 mg/0.1 mL Take by nasal route every 3 minutes until patient awakes or EMS arrives. 01/20/22     ondansetron  (ZOFRAN ) 4 MG tablet Take 1 tablet (4 mg total) by mouth every 8 (eight) hours as needed for nausea or vomiting. 06/03/22   Shuford, Randine, PA-C  oxyCODONE  (ROXICODONE ) 15 MG immediate release tablet Take 1 tablet (15 mg total) by mouth 4 (four) times daily as needed. 02/22/24     pantoprazole  (PROTONIX ) 40 MG tablet TAKE 1 TABLET BY MOUTH TWICE  DAILY 09/28/24   Abran Norleen SAILOR, MD  pilocarpine  (SALAGEN ) 5 MG tablet TAKE 1 TABLET BY MOUTH TWICE  DAILY 05/24/24   Plotnikov, Aleksei V, MD  pioglitazone  (ACTOS ) 15 MG tablet Take 1 tablet (15 mg total) by mouth daily. 10/19/24   Plotnikov, Aleksei V, MD  polyethylene glycol (MIRALAX  / GLYCOLAX ) 17  g packet Take 17 g by mouth daily as needed for moderate constipation.    [provider]  PREVIDENT 5000 DRY MOUTH 1.1 % GEL dental gel Place 1 application  onto teeth in the morning and at bedtime. 10/25/21   [provider]  REPATHA  SURECLICK 140 MG/ML SOAJ INJECT 1 PEN SUBCUTANEOUSLY  EVERY 2 WEEKS 10/24/24   Croitoru, Mihai, MD  Semaglutide ,0.25 or 0.5MG /DOS, (OZEMPIC , 0.25 OR 0.5 MG/DOSE,) 2 MG/3ML SOPN INJECT SUBCUTANEOUSLY 0.5 MG  EVERY WEEK 11/04/24   Plotnikov, Aleksei V, MD  sodium chloride  (OCEAN) 0.65 % SOLN nasal spray Place 1 spray into both nostrils as needed. 05/04/24   Soldatova, Liuba, MD     Critical care time: 31 mins

## 2024-11-06 NOTE — Sepsis Progress Note (Signed)
 Elink monitoring for the code sepsis protocol.

## 2024-11-06 NOTE — Sepsis Progress Note (Signed)
 Pt is an HD pt

## 2024-11-06 NOTE — Progress Notes (Signed)
 eLink Physician-Brief Progress Note Patient Name: [name] DOB: 03-16-1951 MRN: [id number]   Date of Service  11/06/2024  HPI/Events of Note  73/F with abdominal pain, diarrhea, fever. PT was noted to be hypotensive on exam, and had been given fluids and was started on pressors.  Abdominal CT was significant for circumferential wall thickening of the ascending colon concerning for colitis  eICU Interventions  - Continue current antibiotic regimen.  - Follow up cultures, C.diff testing.  - Serial abdominal exam, trend lactate.  - Titrate levophed  to maintain MAP >65 (currently only at 64mcg/min) - Pain control.  - Glucose control.         Sakshi Sermons M DELA CRUZ 11/06/2024, 8:30 PM

## 2024-11-06 NOTE — Sepsis Progress Note (Signed)
 Notified provider and bedside nurse of need to order and administer fluid bolus, pt needs 2478 cc.

## 2024-11-06 NOTE — Progress Notes (Signed)
 Pharmacy Antibiotic Note  [name] is a 73 y.o. female for which pharmacy has been consulted for vancomycin  and zosyn .  Patient with a history of DM, HTN, GERD, ESRD on HD. Patient presenting with fevers and hypotension.  Rocephin  and flagyl  given in the ED  Plan: Zosyn  2.25 q8h Vancomycin  1750 mg once in the ED -- repeat dosing per HD schedule; pharmacy to follow Monitor WBC, fever, renal function, cultures De-escalate when able F/u Nephrology plan  Height: 5' 1 (154.9 cm) Weight: 82.6 kg (182 lb) IBW/kg (Calculated) : 47.8  Temp (24hrs), Avg:101.1 F (38.4 C), Min:99.3 F (37.4 C), Max:102.7 F (39.3 C)  Recent Labs  Lab 11/06/24 1559 11/06/24 1606 11/06/24 1756  WBC 34.9*  --   --   CREATININE 4.26*  --   --   LATICACIDVEN  --  1.9 2.8*    Estimated Creatinine Clearance: 11.6 mL/min (A) (by C-G formula based on SCr of 4.26 mg/dL (H)).    Allergies  Allergen Reactions   Erythromycin  Diarrhea and Nausea And Vomiting   Gabapentin Other (See Comments)    Confusion and falling  Hallucinations    Microbiology results: Pending  Thank you for allowing pharmacy to be a part of this patient's care.  Dorn Buttner, PharmD, BCPS 11/06/2024 7:18 PM ED Clinical Pharmacist -  [phone]

## 2024-11-07 ENCOUNTER — Ambulatory Visit: Payer: Self-pay | Admitting: Family Medicine

## 2024-11-07 DIAGNOSIS — A419 Sepsis, unspecified organism: Secondary | ICD-10-CM

## 2024-11-07 DIAGNOSIS — R935 Abnormal findings on diagnostic imaging of other abdominal regions, including retroperitoneum: Secondary | ICD-10-CM | POA: Diagnosis not present

## 2024-11-07 DIAGNOSIS — R1031 Right lower quadrant pain: Secondary | ICD-10-CM

## 2024-11-07 DIAGNOSIS — R579 Shock, unspecified: Secondary | ICD-10-CM | POA: Diagnosis not present

## 2024-11-07 DIAGNOSIS — K559 Vascular disorder of intestine, unspecified: Secondary | ICD-10-CM | POA: Diagnosis not present

## 2024-11-07 DIAGNOSIS — R6521 Severe sepsis with septic shock: Secondary | ICD-10-CM

## 2024-11-07 LAB — CBC
HCT: 28.9 % — ABNORMAL LOW (ref 36.0–46.0)
Hemoglobin: 9.1 g/dL — ABNORMAL LOW (ref 12.0–15.0)
MCH: 31.1 pg (ref 26.0–34.0)
MCHC: 31.5 g/dL (ref 30.0–36.0)
MCV: 98.6 fL (ref 80.0–100.0)
Platelets: 157 K/uL (ref 150–400)
RBC: 2.93 MIL/uL — ABNORMAL LOW (ref 3.87–5.11)
RDW: 14.7 % (ref 11.5–15.5)
WBC: 27.4 K/uL — ABNORMAL HIGH (ref 4.0–10.5)
nRBC: 0 % (ref 0.0–0.2)

## 2024-11-07 LAB — LACTIC ACID, PLASMA
Lactic Acid, Venous: 1.4 mmol/L (ref 0.5–1.9)
Lactic Acid, Venous: 1.4 mmol/L (ref 0.5–1.9)

## 2024-11-07 LAB — BASIC METABOLIC PANEL WITH GFR
Anion gap: 18 — ABNORMAL HIGH (ref 5–15)
BUN: 46 mg/dL — ABNORMAL HIGH (ref 8–23)
CO2: 22 mmol/L (ref 22–32)
Calcium: 8.5 mg/dL — ABNORMAL LOW (ref 8.9–10.3)
Chloride: 92 mmol/L — ABNORMAL LOW (ref 98–111)
Creatinine, Ser: 4.35 mg/dL — ABNORMAL HIGH (ref 0.44–1.00)
GFR, Estimated: 10 mL/min — ABNORMAL LOW (ref >60)
Glucose, Bld: 81 mg/dL (ref 70–99)
Potassium: 4 mmol/L (ref 3.5–5.1)
Sodium: 132 mmol/L — ABNORMAL LOW (ref 135–145)

## 2024-11-07 LAB — MRSA NEXT GEN BY PCR, NASAL: MRSA by PCR Next Gen: NOT DETECTED

## 2024-11-07 LAB — BLOOD GAS, VENOUS
Acid-base deficit: 1.3 mmol/L (ref 0.0–2.0)
Bicarbonate: 24.3 mmol/L (ref 20.0–28.0)
Drawn by: 62344
O2 Saturation: 76 %
Patient temperature: 36.9
pCO2, Ven: 43 mmHg — ABNORMAL LOW (ref 44–60)
pH, Ven: 7.36 (ref 7.25–7.43)
pO2, Ven: 45 mmHg (ref 32–45)

## 2024-11-07 LAB — GLUCOSE, CAPILLARY
Glucose-Capillary: 112 mg/dL — ABNORMAL HIGH (ref 70–99)
Glucose-Capillary: 44 mg/dL — CL (ref 70–99)
Glucose-Capillary: 48 mg/dL — ABNORMAL LOW (ref 70–99)
Glucose-Capillary: 63 mg/dL — ABNORMAL LOW (ref 70–99)
Glucose-Capillary: 97 mg/dL (ref 70–99)
Glucose-Capillary: 118 mg/dL — ABNORMAL HIGH (ref 70–99)
Glucose-Capillary: 118 mg/dL — ABNORMAL HIGH (ref 70–99)
Glucose-Capillary: 122 mg/dL — ABNORMAL HIGH (ref 70–99)
Glucose-Capillary: 105 mg/dL — ABNORMAL HIGH (ref 70–99)

## 2024-11-07 MED ORDER — DEXTROSE IN LACTATED RINGERS 5 % IV SOLN: INTRAVENOUS | Status: DC

## 2024-11-07 MED ORDER — INSULIN ASPART 100 UNIT/ML IJ SOLN: 0.0000 [IU] | INTRAMUSCULAR | Status: DC

## 2024-11-07 MED ORDER — ACETAMINOPHEN 325 MG PO TABS
650.0000 mg | ORAL_TABLET | Freq: Four times a day (QID) | ORAL | Status: DC | PRN
  Administered 2024-11-07 – 2024-11-12 (×6): 650 mg via ORAL
  Filled 2024-11-07 (×6): qty 2

## 2024-11-07 MED ORDER — FAMOTIDINE IN NACL 20-0.9 MG/50ML-% IV SOLN
20.0000 mg | Freq: Every day | INTRAVENOUS | Status: DC
  Administered 2024-11-07 – 2024-11-10 (×4): 20 mg via INTRAVENOUS
  Filled 2024-11-07 (×4): qty 50

## 2024-11-07 MED ORDER — HEPARIN SODIUM (PORCINE) 5000 UNIT/ML IJ SOLN
5000.0000 [IU] | Freq: Three times a day (TID) | INTRAMUSCULAR | Status: DC
  Administered 2024-11-07 – 2024-11-12 (×14): 5000 [IU] via SUBCUTANEOUS
  Filled 2024-11-07 (×15): qty 1

## 2024-11-07 NOTE — Plan of Care (Signed)

## 2024-11-07 NOTE — Inpatient Diabetes Management (Signed)
 Inpatient Diabetes Program Recommendations  AACE/ADA: New Consensus Statement on Inpatient Glycemic Control (2015)  Target Ranges:  Prepandial:   less than 140 mg/dL      Peak postprandial:   less than 180 mg/dL (1-2 hours)      Critically ill patients:  140 - 180 mg/dL   Lab Results  Component Value Date   GLUCAP 97 11/07/2024   HGBA1C 6.1 (A) 09/12/2024    Latest Reference Range & Units 11/07/24 03:23 11/07/24 07:31 11/07/24 07:34 11/07/24 08:06 11/07/24 08:58  Glucose-Capillary 70 - 99 mg/dL 887 (H) 44 (LL) 48 (L) 63 (L) 97  (LL): Data is critically low (H): Data is abnormally high (L): Data is abnormally low Review of Glycemic Control  Diabetes history: DM2 Outpatient Diabetes medications: Amaryl  1 mg daily, Actos  15 mg daily, Ozempic  0.5 mg weekly Current orders for Inpatient glycemic control: Novolog  0-9 units correction scale every 4 hours  Inpatient Diabetes Program Recommendations:   Noted that patient has been having low blood sugars.   Recommend changing Novolog  scale to 0-6 units every 4 hours especially since patient has ESRD.  Marjorie Lunger RN BSN CDE Diabetes Coordinator Pager: [phone]  8am-5pm

## 2024-11-07 NOTE — Progress Notes (Signed)
 RN made multiple calls to phlebotomy for stat 0110 lactic repeat lab draw.  RN unable to get into contact with phlebotomy. Will re-attempt.   Sonny DASEN RN

## 2024-11-07 NOTE — H&P (Signed)
 NAME:  [name], MRN:  [id number], DOB:  01-24-1951, LOS: 1 ADMISSION DATE:  11/06/2024, CONSULTATION DATE:  11/06/24 REFERRING MD:  EDP, CHIEF COMPLAINT:  febrile   History of Present Illness:  28 year woman female, retired endoscopy nurse, with multiple medical problems including, but not limited to, diabetes mellitus, hypertension, end-stage renal disease on hemodialysis, osteoarthritis, and obesity.  She has been followed in this office for GERD, history of ischemic colitis, and colon cancer screening/surveillance  p/w abd pain, diarrhea, fevers x 2 days.  Hypotensive refractory to fluids, PCCM consulted.  Scan showing circumferential colonic inflammation/thickening colitis vs. Neoplasm.  Has seen GI in March 2024 for elevated ferritin  Pertinent  Medical History     has a past medical history of Anemia, Anxiety, Brain tumor (benign) ([name]), Colitis (2010), Depression, Diabetes mellitus, Dyspnea, ESRD (end stage renal disease) ([name]), Fibromyalgia, GERD (gastroesophageal reflux disease), Headache, History of blood transfusion, Hypertension, IBS (irritable bowel syndrome), LBP (low back pain), Neuropathy, Osteoarthritis, Osteopenia, Pneumonia, Rotator cuff tear, right, and Sinusitis.   has a past surgical history that includes foramen magnum ependymoma surgery (2003); Total knee arthroplasty; Joint replacement (Bilateral); Tonsillectomy; Nasal sinus surgery; Total knee revision (Right, 03/05/2015); AV fistula placement (Right, 09/12/2020); Bascilic vein transposition (Right, 10/27/2020); Colonoscopy with propofol  (N/A, 03/11/2022); biopsy (03/11/2022); Reverse shoulder arthroplasty (Right, 06/03/2022); A/V Fistulagram (N/A, 02/03/2024); PERIPHERAL VASCULAR BALLOON ANGIOPLASTY (02/03/2024); A/V SHUNT INTERVENTION (Right, 05/30/2024); VENOUS ANGIOPLASTY (05/30/2024); VENOUS ANGIOPLASTY (09/07/2024); and A/V SHUNT INTERVENTION (N/A, 09/07/2024).   Significant Hospital Events: Including procedures,  antibiotic start and stop dates in addition to other pertinent events   11/25 admit  Interim History / Subjective:    11/26- hx retake. Says she is normally constipared and takes miralx. Then after HD 2d ago had abd pain and soft stool needing admit. Reports retd from [facility] Endo unit in 2016 due to knee issues. Anuric and on HD since 2021 or so. At HD BP can drop into 80s and 70. 2d ago left for him with SBP 80s. Here diast low and on 4 Levophed . On 3L [place] pulse ox 97%.   Having abd cramps here.   Objective    Blood pressure (!) 111/50, pulse 72, temperature 98.4 F (36.9 C), temperature source Oral, resp. rate 15, height 5' 1 (1.549 m), weight 88.9 kg, SpO2 97%.        Intake/Output Summary (Last 24 hours) at 11/07/2024 0957 Last data filed at 11/07/2024 0602 Gross per 24 hour  Intake 4810.03 ml  Output --  Net 4810.03 ml   Filed Weights   11/06/24 1609 11/06/24 2000 11/07/24 0500  Weight: 82.6 kg 87 kg 88.9 kg    Examination: General Appearance:  Looks stable Head:  Normocephalic, without obvious abnormality, atraumatic Eyes:  PERRL - yes, conjunctiva/corneas - muddy     Ears:  Normal external ear canals, both ears Nose:  G tube - no but has Larose Throat:  ETT TUBE - no , OG tube - no Neck:  Supple,  No enlargement/tenderness/nodules Lungs: Clear to auscultation bilaterally, Heart:  S1 and S2 normal, no murmur, CVP - no.  Pressors - LEVOPHED  Abdomen:  Soft, no masses, no organomegaly but Non specifically tender RUQ Genitalia / Rectal:  Not done Extremities:  Extremities- intact Skin:  ntact in exposed areas . Sacral area - x Neurologic:  Sedation - none -> RASS - +1 . Moves all 4s - yes. CAM-ICU - neg . Orientation - x3+     Resolved problem list  Assessment and Plan  Acute resp failur - mild, 3L [place] need   11/26 - 3L [place] /CXR clear. ? True need  Plan  - turn o2 off and reassess (90% RA)   Hx of chronic hypotension peri- HD with low diastolic Septic shock  secondary to colitis  11/26 - sbp 120 and MAP 70 with diastlow. ON LR at 50cc.h  Plan  - reduce BP goal to sBP > 105; titrate levophed  as appropraite  - reduce fluid to KVP  Colitis - lactate normal (MRSA PCR -neg)  -not had stool output since admit. No tests done. On Vanc IV, Zosyn  IV and PO vanc  Plan  - stop IV vanc - continue po vanc - cotinue zosyn  for now - paged GI - - trend lactate, consideration for surgical consultation if keeps going up and/or develops peritoneal signs  ESRD on HD (has fistula)  Plan  - Renal Dr Oliver called  DM  - seen by DM coordinator and concern for hypogluycemia 11/07/24  plan - start KVO fluid with D5 in it  - reduce insulin  dosage per DM coordinator     Dr Irvin check list  Diet: clear liquid Pain/Anxiety/Delirium protocol (if indicated): x VAP protocol (if indicated): x DVT prophylaxis: heparin  sq 5K GI prophylaxis:  pepcid  Glucose control: ssi Mobility: bed rst Code Status: DNR if arrest. No CPR but ok to intubte. Full score of care + Family Communication: patient at bedids Disposition: ICU     ATTESTATION & SIGNATURE   The patient [name] is critically ill with multiple organ systems failure and requires high complexity decision making for assessment and support, frequent evaluation and titration of therapies, application of advanced monitoring technologies and extensive interpretation of multiple databases and discussion with other appropriate health care personnel such as bedside nurses, social workers, case production designer, theatre/television/film, consultants, respiratory therapists, nutritionists, secretaries etc.,  Critical care time includes but is not restricted to just documentation time. Documentation can happen in parallel or sequential to care time depending on case mix urgency and priorities for the shift. So, overall critical Care Time devoted to patient care services described in this note is  30  Minutes.   This time reflects  time of care of this signee Dr Dorethia Cave which includ does not reflect procedure time, or teaching time or supervisory time of PA/NP/Med student/Med Resident etc but could involve care discussion time     Dr. Dorethia Cave, M.D., [facility].C.P Pulmonary and Critical Care Medicine Staff Physician, [facility] Pager: [phone], If no answer or between  15:00h - 7:00h: call [phone]  11/07/2024 9:57 AM   LABS    PULMONARY No results for input(s): PHART, PCO2ART, PO2ART, HCO3, TCO2, O2SAT in the last 168 hours.  Invalid input(s): PCO2, PO2  CBC Recent Labs  Lab 11/06/24 1559 11/07/24 0502  HGB 11.1* 9.1*  HCT 34.6* 28.9*  WBC 34.9* 27.4*  PLT 216 157    COAGULATION Recent Labs  Lab 11/06/24 1559  INR 1.1    CARDIAC  No results for input(s): TROPONINI in the last 168 hours. No results for input(s): PROBNP in the last 168 hours.   CHEMISTRY Recent Labs  Lab 11/06/24 1559 11/07/24 0502  NA 132* 132*  K 4.4 4.0  CL 90* 92*  CO2 25 22  GLUCOSE 172* 81  BUN 41* 46*  CREATININE 4.26* 4.35*  CALCIUM 9.3 8.5*   Estimated Creatinine Clearance:  11.8 mL/min (A) (by C-G formula based on SCr of 4.35 mg/dL (H)).   LIVER Recent Labs  Lab 11/06/24 1559  AST 23  ALT 20  ALKPHOS 102  BILITOT 0.9  PROT 6.8  ALBUMIN  3.3*  INR 1.1     INFECTIOUS Recent Labs  Lab 11/06/24 2309 11/07/24 0502 11/07/24 0831  LATICACIDVEN 1.5 1.4 1.4     ENDOCRINE CBG (last 3)  Recent Labs    11/07/24 0734 11/07/24 0806 11/07/24 0858  GLUCAP 48* 63* 97         IMAGING x48h  - image(s) personally visualized  -   highlighted in bold CT ABDOMEN PELVIS WO CONTRAST Result Date: 11/06/2024 EXAM: CT ABDOMEN AND PELVIS WITHOUT CONTRAST 11/06/2024 05:40:33 PM TECHNIQUE: CT of the abdomen and pelvis was performed without the administration of intravenous contrast. Multiplanar reformatted images  are provided for review. Automated exposure control, iterative reconstruction, and/or weight-based adjustment of the mA/kV was utilized to reduce the radiation dose to as low as reasonably achievable. COMPARISON: Chest x-ray 11/06/2024, CT chest abdomen pelvis 03/10/2022. CLINICAL HISTORY: Abdominal pain, acute, nonlocalized. FINDINGS: LOWER CHEST: Coronary vascular calcifications. LIVER: The liver is unremarkable. GALLBLADDER AND BILE DUCTS: Distended gallbladder without calcified stone or right upper quadrant inflammation. No biliary ductal dilatation. SPLEEN: No acute abnormality. PANCREAS: Atrophic pancreas without inflammation. ADRENAL GLANDS: No acute abnormality. KIDNEYS, URETERS AND BLADDER: Atrophic kidneys without hydronephrosis. Simple and complex cysts within the bilateral kidneys. Per consensus, no follow-up is needed for simple Bosniak type 1 and 2 renal cysts, unless the patient has a malignancy history or risk factors. No stones in the kidneys or ureters. No perinephric or periureteral stranding. Decompressed urinary bladder. GI AND BOWEL: Stomach demonstrates no acute abnormality. Dense material within the colon. Diverticular disease of the left colon. Circumferential wall thickening of the ascending colon with pericolonic stranding, series 3 image 44. Moderate to large colonic stool burden. No extraluminal gas to suggest a perforation. There is no bowel obstruction. PERITONEUM AND RETROPERITONEUM: No ascites. No free air. VASCULATURE: Vascular structures are limited without intravenous contrast. Aorta is normal in caliber. LYMPH NODES: Few small right lower quadrant mesenteric nodes measuring up to 9 mm. REPRODUCTIVE ORGANS: No acute abnormality. BONES AND SOFT TISSUES: Scoliosis and degenerative changes of the spine. No acute osseous abnormality. Moderate fat-containing umbilical hernia. No focal soft tissue abnormality. IMPRESSION: 1. Circumferential wall thickening of the ascending colon with  pericolonic stranding, no evidence for perforation. Findings could be secondary to focal colitis of infectious, inflammatory, or ischemic etiology; however, inflammatory colonic neoplasm is also a concern. Recommend correlation with colonoscopy. 2. Diverticular disease of the left colon without evidence of diverticulitis. 3. Markedly distended gallbladder without calcified stone or inflammation 4. Atrophic kidneys. Simple and complex cysts within both kidneys, nonemergent follow-up renal CT or MRI could be considered. Electronically signed by: Luke Bun MD 11/06/2024 06:09 PM EST RP Workstation: [id number]   DG Chest Port 1 View Result Date: 11/06/2024 EXAM: 1 VIEW(S) XRAY OF THE CHEST 11/06/2024 04:15:00 PM COMPARISON: 06/07/2023 CLINICAL HISTORY: Questionable sepsis - evaluate for abnormality FINDINGS: LUNGS AND PLEURA: Elevated right hemidiaphragm. No focal pulmonary opacity. No pleural effusion. No pneumothorax. HEART AND MEDIASTINUM: No acute abnormality of the cardiac and mediastinal silhouettes. BONES AND SOFT TISSUES: Right reverse shoulder arthroplasty noted. Asymmetric spurring of the right acromioclavicular joint. Thoracic spondylosis. Dextroconvex thoracic scoliosis. No acute osseous abnormality. IMPRESSION: 1. No acute cardiopulmonary abnormality. 2. Right reverse shoulder arthroplasty. 3. Elevated right hemidiaphragm. 4. Asymmetric degenerative changes  of the right acromioclavicular joint. 5. Thoracic spondylosis. 6. Dextroconvex thoracic scoliosis. Electronically signed by: Ryan Salvage MD 11/06/2024 04:42 PM EST RP Workstation: [id number]

## 2024-11-07 NOTE — Progress Notes (Signed)
 Aware of low blood sugar. Pt alert and oriented. Drinking juice. Will recheck 30 minutes

## 2024-11-07 NOTE — Progress Notes (Signed)
 Negative viral testing, discussed in OV

## 2024-11-07 NOTE — Consult Note (Signed)
 [facility] Renal Consultation Note    Indication for Consultation:  Management of ESRD/hemodialysis, anemia, hypertension/volume, and secondary hyperparathyroidism.  HPI: STEPHANIEANN POPESCU is a 73 y.o. female with PMH including ERSD on dialysis, T2DM, HTN, OA, who presented to the ED yesterday with fever of 102F at PCP office. She reported she started feeling poorly after dialysis on 11/05/24. She reported several loose stools and generalized abdominal pain, poor PO intake. CT abdomen was consistent with colitis. CXR was without acute abnormalities. She continued to have hypotension despite fluid boluses so she was started on levophed  and admitted to the ICU. Was started on antibiotics, now on PO vanc and zosyn . BP improved but remains on levophed . Nephrology was consulted for ESRD management.  Patient's last dialysis was 11/05/24. She left below her EDW and did complete a full treatment. Labs today notable for K+ 4.0, BUN 46, Cr 4.35, Ca 8.5, WBC 27.4, Hgb 9.1,. Plt 157. Her typical dialysis schedule is TTS but she was scheduled for dialysis Monday, Wednesday, Saturday this week due to the holiday. She reports she feels like she is retaining fluid and her feet are starting to swell. L ankle always has trace edema from a prior injury. She denies SOB, orthopnea, CP, palpitations, dizziness. Continues to have abdominal pain. Denies diarrhea/nausea/vomiting. Reports her phosphorus binders tend to make her constipated.   Past Medical History:  Diagnosis Date   Anemia    Anxiety    Brain tumor (benign) ([name])    Colitis 2010   microscopic- Dr Abran   Depression    Diabetes mellitus    type II   Dyspnea    with exertion   ESRD (end stage renal disease) ([name])    TTUSAT [address]    Fibromyalgia    GERD (gastroesophageal reflux disease)    Headache    History of blood transfusion    after knee surgery   Hypertension    discontinued all diuretics and antihypertensives   IBS  (irritable bowel syndrome)    LBP (low back pain)    Neuropathy    feet bilat    Osteoarthritis    Osteopenia    Pneumonia    hx of 2014    Rotator cuff tear, right    Sinusitis    currently being treated with antibiotic will complete 03/04/2015   Past Surgical History:  Procedure Laterality Date   A/V FISTULAGRAM N/A 02/03/2024   Procedure: A/V Fistulagram;  Surgeon: Norine Manuelita LABOR, MD;  Location: [facility];  Service: Cardiovascular;  Laterality: N/A;   A/V SHUNT INTERVENTION Right 05/30/2024   Procedure: A/V SHUNT INTERVENTION;  Surgeon: Pearline Norman RAMAN, MD;  Location: [facility];  Service: Cardiovascular;  Laterality: Right;   A/V SHUNT INTERVENTION N/A 09/07/2024   Procedure: A/V SHUNT INTERVENTION;  Surgeon: Lanis Fonda BRAVO, MD;  Location: [facility];  Service: Cardiovascular;  Laterality: N/A;   AV FISTULA PLACEMENT Right 09/12/2020   Procedure: RIGHT ARTERIOVENOUS (AV) FISTULA CREATION;  Surgeon: Harvey Carlin BRAVO, MD;  Location: [facility] OR;  Service: Vascular;  Laterality: Right;   BASCILIC VEIN TRANSPOSITION Right 10/27/2020   Procedure: RIGHT UPPER EXTREMITY SECOND STAGE BASCILIC VEIN TRANSPOSITION;  Surgeon: Harvey Carlin BRAVO, MD;  Location: [facility] OR;  Service: Vascular;  Laterality: Right;   BIOPSY  03/11/2022   Procedure: BIOPSY;  Surgeon: Wilhelmenia Aloha Raddle., MD;  Location: [facility] ENDOSCOPY;  Service: Gastroenterology;;   COLONOSCOPY WITH PROPOFOL  N/A 03/11/2022   Procedure: COLONOSCOPY WITH PROPOFOL ;  Surgeon: Wilhelmenia Aloha Raddle., MD;  Location: [facility] ENDOSCOPY;  Service: Gastroenterology;  Laterality: N/A;   foramen magnum ependymoma surgery  2003   Dr Mora   JOINT REPLACEMENT Bilateral    NASAL SINUS SURGERY     1973    PERIPHERAL VASCULAR BALLOON ANGIOPLASTY  02/03/2024   Procedure: PERIPHERAL VASCULAR BALLOON ANGIOPLASTY;  Surgeon: Norine Manuelita LABOR, MD;  Location: [facility];  Service: Cardiovascular;;  Inflow Basilic Vein   REVERSE SHOULDER  ARTHROPLASTY Right 06/03/2022   Procedure: REVERSE SHOULDER ARTHROPLASTY;  Surgeon: Melita Drivers, MD;  Location: [facility] ORS;  Service: Orthopedics;  Laterality: Right;    TONSILLECTOMY     TOTAL KNEE ARTHROPLASTY     L 2008, R 2009, R 2016- Dr hiram   TOTAL KNEE REVISION Right 03/05/2015   Procedure: RIGHT TOTAL KNEE ARTHROPLASTY REVISION;  Surgeon: Dempsey Moan, MD;  Location: [facility] ORS;  Service: Orthopedics;  Laterality: Right;   VENOUS ANGIOPLASTY  05/30/2024   Procedure: VENOUS ANGIOPLASTY;  Surgeon: Pearline Norman RAMAN, MD;  Location: [facility];  Service: Cardiovascular;;  central   VENOUS ANGIOPLASTY  09/07/2024   Procedure: VENOUS ANGIOPLASTY;  Surgeon: Lanis Fonda BRAVO, MD;  Location: [facility];  Service: Cardiovascular;;  innominate 70%   Family History  Problem Relation Age of Onset   Depression Mother    Hypertension Mother    Stroke Mother 17   Diabetes Mother    Diabetes Father    Hyperlipidemia Father    Hypertension Father    Crohn's disease Maternal Uncle    Diabetes Brother    Heart disease Brother    Hyperlipidemia Brother    Hypertension Brother    Prostate cancer Brother    Diabetes Brother    Heart disease Brother        Heart Disease before age 62   Heart attack Brother    Stroke Brother        X's 2   Hyperlipidemia Brother    Hypertension Brother    Diabetes Other    Colon cancer Neg Hx    Esophageal cancer Neg Hx    Stomach cancer Neg Hx    Pancreatic cancer Neg Hx    Breast cancer Neg Hx    BRCA 1/2 Neg Hx    Social History:  reports that she quit smoking about 35 years ago. Her smoking use included cigarettes. She started smoking about 55 years ago. She has a 20 pack-year smoking history. She has never used smokeless tobacco. She reports that she does not currently use alcohol . She reports that she does not use drugs.  ROS: As per HPI otherwise negative.  Physical Exam: Vitals:   11/07/24 0845 11/07/24 0900 11/07/24 0915 11/07/24 0930  BP:  (!) 117/52 (!) 112/54 97/70 (!) 111/50  Pulse: 70 71 73 72  Resp: 14 20 15 15   Temp:      TempSrc:      SpO2: 98% 98% 97% 97%  Weight:      Height:         General: Well developed, well nourished, in no acute distress. Head: Normocephalic, atraumatic, sclera non-icteric, mucus membranes are moist. Neck: Supple without lymphadenopathy/masses. JVD not elevated. Lungs: Clear bilaterally to auscultation without wheezes, rales, or rhonchi. Breathing is unlabored. Heart: RRR with normal S1, S2. No murmurs, rubs, or gallops appreciated. Abdomen: Soft, non-distended with normoactive bowel sounds.  Musculoskeletal:  Strength and tone appear normal for age. Lower extremities: trace edema LLE Neuro: Alert  and oriented X 3. Moves all extremities spontaneously. Psych:  Responds to questions appropriately with a normal affect. Dialysis Access: RUE AVF + t/b  Allergies  Allergen Reactions   Erythromycin  Diarrhea and Nausea And Vomiting   Gabapentin Other (See Comments)    Confusion and falling  Hallucinations   Prior to Admission medications   Medication Sig Start Date End Date Taking? Authorizing Provider  acetaminophen  (TYLENOL ) 500 MG tablet Take 1,000 mg by mouth 2 (two) times daily as needed for headache.   Yes [provider]  aspirin -acetaminophen -caffeine (EXCEDRIN MIGRAINE) 250-250-65 MG tablet Take 1 tablet by mouth daily as needed for headache or migraine. Max 3 doses in one week   Yes [provider]  B Complex-C-Folic Acid  (RENA-VITE PO) Take 1 tablet by mouth every morning.   Yes [provider]  buPROPion  (WELLBUTRIN  XL) 300 MG 24 hr tablet Take 1 tablet (300 mg total) by mouth in the morning. 10/19/24  Yes Plotnikov, Aleksei V, MD  carboxymethylcellulose (REFRESH PLUS) 0.5 % SOLN Place 1 drop into both eyes 3 (three) times daily as needed (dry eyes/irritation).   Yes [provider]  cinacalcet  (SENSIPAR ) 30 MG tablet Take 30 mg by mouth Every  Tuesday,Thursday,and Saturday with dialysis.   Yes [provider]  cyclobenzaprine  (FLEXERIL ) 10 MG tablet Take 1 tablet (10 mg total) by mouth 3 (three) times daily as needed for muscle spasms. 09/12/24  Yes Plotnikov, Aleksei V, MD  diphenhydramine -acetaminophen  (TYLENOL  PM) 25-500 MG TABS tablet Take 2 tablets by mouth 3 (three) times a week. At bedtime   Yes [provider]  FLUoxetine  (PROZAC ) 40 MG capsule Take 1 capsule (40 mg total) by mouth in the morning. 08/26/23  Yes   glimepiride  (AMARYL ) 1 MG tablet Take 1 tablet (1 mg total) by mouth daily with breakfast. 10/19/24  Yes Plotnikov, Aleksei V, MD  ibuprofen (ADVIL) 200 MG tablet Take 200 mg by mouth every 6 (six) hours as needed for fever, headache, mild pain (pain score 1-3) or moderate pain (pain score 4-6).   Yes [provider]  ketoconazole  (NIZORAL ) 2 % cream Apply 1 Application topically daily. Patient taking differently: Apply 1 Application topically daily as needed for irritation. 04/04/23  Yes Plotnikov, Aleksei V, MD  lanthanum (FOSRENOL) 1000 MG chewable tablet Chew 1,000-2,000 mg by mouth See admin instructions. Taking 2000 mg with meals and 1000 mg  with snacks   Yes [provider]  LORazepam  (ATIVAN ) 2 MG tablet Take 1 tablet (2 mg total) by mouth 2 (two) times daily as needed for anxiety. 03/14/24  Yes   oxyCODONE  (ROXICODONE ) 15 MG immediate release tablet Take 1 tablet (15 mg total) by mouth 4 (four) times daily as needed. Patient taking differently: Take 15 mg by mouth 4 (four) times daily as needed for pain. 02/22/24  Yes   pantoprazole  (PROTONIX ) 40 MG tablet TAKE 1 TABLET BY MOUTH TWICE  DAILY 09/28/24  Yes Abran Norleen SAILOR, MD  pilocarpine  (SALAGEN ) 5 MG tablet TAKE 1 TABLET BY MOUTH TWICE  DAILY 05/24/24  Yes Plotnikov, Aleksei V, MD  pioglitazone  (ACTOS ) 15 MG tablet Take 1 tablet (15 mg total) by mouth daily. 10/19/24  Yes Plotnikov, Karlynn GAILS, MD  Accu-Chek Softclix Lancets lancets Use 1  lancet up to 4 times daily as directed 03/12/22   Samtani, Jai-Gurmukh, MD  azelastine  (ASTELIN ) 0.1 % nasal spray Place 2 sprays into both nostrils 2 (two) times daily. Use in each nostril as directed Patient not taking:  Reported on 11/07/2024 11/30/23   Soldatova, Liuba, MD  docusate sodium  (COLACE) 50 MG capsule Take 50 mg by mouth 2 (two) times daily.    [provider]  fluticasone  (FLONASE ) 50 MCG/ACT nasal spray Place 2 sprays into both nostrils daily. 11/30/23   Soldatova, Liuba, MD  midodrine  (PROAMATINE ) 10 MG tablet Take 3 tablets (30 mg total) by mouth 3 (three) times a week. Take 30 minutes before dialysis and extra tablet during dialysis. Patient taking differently: Take 10 mg by mouth 3 (three) times a week. Take 30 minutes before dialysis and extra tablet during dialysis. 11/21/23   Marlee Bernardino NOVAK, MD  naloxone  (NARCAN ) nasal spray 4 mg/0.1 mL Take by nasal route every 3 minutes until patient awakes or EMS arrives. 01/20/22     ondansetron  (ZOFRAN ) 4 MG tablet Take 1 tablet (4 mg total) by mouth every 8 (eight) hours as needed for nausea or vomiting. 06/03/22   Shuford, Randine, PA-C  polyethylene glycol (MIRALAX  / GLYCOLAX ) 17 g packet Take 17 g by mouth daily as needed for moderate constipation.    [provider]  PREVIDENT 5000 DRY MOUTH 1.1 % GEL dental gel Place 1 application  onto teeth in the morning and at bedtime. 10/25/21   [provider]  REPATHA  SURECLICK 140 MG/ML SOAJ INJECT 1 PEN SUBCUTANEOUSLY  EVERY 2 WEEKS 10/24/24   Croitoru, Mihai, MD  Semaglutide ,0.25 or 0.5MG /DOS, (OZEMPIC , 0.25 OR 0.5 MG/DOSE,) 2 MG/3ML SOPN INJECT SUBCUTANEOUSLY 0.5 MG  EVERY WEEK 11/04/24   Plotnikov, Aleksei V, MD  sodium chloride  (OCEAN) 0.65 % SOLN nasal spray Place 1 spray into both nostrils as needed. 05/04/24   Soldatova, Liuba, MD   Current Facility-Administered Medications  Medication Dose Route Frequency Provider Last Rate Last Admin   0.9 %  sodium chloride   infusion  250 mL Intravenous Continuous Claudene Toribio BROCKS, MD 10 mL/hr at 11/07/24 0602 Infusion Verify at 11/07/24 0602   acetaminophen  (TYLENOL ) tablet 650 mg  650 mg Oral Q6H PRN Mallory Sheela Kieth CHRISTELLA, MD   650 mg at 11/07/24 0252   Chlorhexidine  Gluconate Cloth 2 % PADS 6 each  6 each Topical Daily Claudene Toribio BROCKS, MD   6 each at 11/06/24 2041   dextrose  5 % in lactated ringers  infusion   Intravenous Continuous Geronimo Amel, MD       docusate sodium  (COLACE) capsule 100 mg  100 mg Oral BID PRN Claudene Toribio BROCKS, MD       famotidine  (PEPCID ) IVPB 20 mg premix  20 mg Intravenous Daily Ramaswamy, Murali, MD       heparin  injection 5,000 Units  5,000 Units Subcutaneous Q8H Geronimo Amel, MD       HYDROmorphone  (DILAUDID ) injection 0.5 mg  0.5 mg Intravenous Q4H PRN Mallory Sheela Kieth CHRISTELLA, MD   0.5 mg at 11/07/24 1034   insulin  aspart (novoLOG ) injection 0-6 Units  0-6 Units Subcutaneous Q4H Geronimo Amel, MD       norepinephrine  (LEVOPHED ) 4mg  in (0.016 mg/mL) premix infusion  0-10 mcg/min Intravenous Titrated Claudene Toribio BROCKS, MD 15 mL/hr at 11/07/24 0602 4 mcg/min at 11/07/24 0602   ondansetron  (ZOFRAN ) injection 4 mg  4 mg Intravenous Q6H PRN Claudene Toribio BROCKS, MD       Oral care mouth rinse  15 mL Mouth Rinse PRN Claudene Toribio BROCKS, MD       piperacillin -tazobactam (ZOSYN ) IVPB 2.25 g  2.25 g Intravenous Q8H Claudene Toribio BROCKS, MD   Stopped at 11/07/24 [phone]  polyethylene glycol (MIRALAX  / GLYCOLAX ) packet 17 g  17 g Oral Daily PRN Claudene Toribio BROCKS, MD       vancomycin  (VANCOCIN ) capsule 125 mg  125 mg Oral QID Claudene Toribio BROCKS, MD   125 mg at 11/07/24 1032   Labs: Basic Metabolic Panel: Recent Labs  Lab 11/06/24 1559 11/07/24 0502  NA 132* 132*  K 4.4 4.0  CL 90* 92*  CO2 25 22  GLUCOSE 172* 81  BUN 41* 46*  CREATININE 4.26* 4.35*  CALCIUM 9.3 8.5*   Liver Function Tests: Recent Labs  Lab 11/06/24 1559  AST 23  ALT 20  ALKPHOS 102  BILITOT 0.9  PROT 6.8  ALBUMIN  3.3*   No  results for input(s): LIPASE, AMYLASE in the last 168 hours. No results for input(s): AMMONIA in the last 168 hours. CBC: Recent Labs  Lab 11/06/24 1559 11/07/24 0502  WBC 34.9* 27.4*  NEUTROABS 32.5*  --   HGB 11.1* 9.1*  HCT 34.6* 28.9*  MCV 97.5 98.6  PLT 216 157   Cardiac Enzymes: No results for input(s): CKTOTAL, CKMB, CKMBINDEX, TROPONINI in the last 168 hours. CBG: Recent Labs  Lab 11/07/24 0731 11/07/24 0734 11/07/24 0806 11/07/24 0858 11/07/24 1134  GLUCAP 44* 48* 63* 97 118*   Iron Studies: No results for input(s): IRON, TIBC, TRANSFERRIN, FERRITIN in the last 72 hours. Studies/Results: CT ABDOMEN PELVIS WO CONTRAST Result Date: 11/06/2024 EXAM: CT ABDOMEN AND PELVIS WITHOUT CONTRAST 11/06/2024 05:40:33 PM TECHNIQUE: CT of the abdomen and pelvis was performed without the administration of intravenous contrast. Multiplanar reformatted images are provided for review. Automated exposure control, iterative reconstruction, and/or weight-based adjustment of the mA/kV was utilized to reduce the radiation dose to as low as reasonably achievable. COMPARISON: Chest x-ray 11/06/2024, CT chest abdomen pelvis 03/10/2022. CLINICAL HISTORY: Abdominal pain, acute, nonlocalized. FINDINGS: LOWER CHEST: Coronary vascular calcifications. LIVER: The liver is unremarkable. GALLBLADDER AND BILE DUCTS: Distended gallbladder without calcified stone or right upper quadrant inflammation. No biliary ductal dilatation. SPLEEN: No acute abnormality. PANCREAS: Atrophic pancreas without inflammation. ADRENAL GLANDS: No acute abnormality. KIDNEYS, URETERS AND BLADDER: Atrophic kidneys without hydronephrosis. Simple and complex cysts within the bilateral kidneys. Per consensus, no follow-up is needed for simple Bosniak type 1 and 2 renal cysts, unless the patient has a malignancy history or risk factors. No stones in the kidneys or ureters. No perinephric or periureteral stranding.  Decompressed urinary bladder. GI AND BOWEL: Stomach demonstrates no acute abnormality. Dense material within the colon. Diverticular disease of the left colon. Circumferential wall thickening of the ascending colon with pericolonic stranding, series 3 image 44. Moderate to large colonic stool burden. No extraluminal gas to suggest a perforation. There is no bowel obstruction. PERITONEUM AND RETROPERITONEUM: No ascites. No free air. VASCULATURE: Vascular structures are limited without intravenous contrast. Aorta is normal in caliber. LYMPH NODES: Few small right lower quadrant mesenteric nodes measuring up to 9 mm. REPRODUCTIVE ORGANS: No acute abnormality. BONES AND SOFT TISSUES: Scoliosis and degenerative changes of the spine. No acute osseous abnormality. Moderate fat-containing umbilical hernia. No focal soft tissue abnormality. IMPRESSION: 1. Circumferential wall thickening of the ascending colon with pericolonic stranding, no evidence for perforation. Findings could be secondary to focal colitis of infectious, inflammatory, or ischemic etiology; however, inflammatory colonic neoplasm is also a concern. Recommend correlation with colonoscopy. 2. Diverticular disease of the left colon without evidence of diverticulitis. 3. Markedly distended gallbladder without calcified stone or inflammation 4. Atrophic kidneys. Simple and complex cysts within both  kidneys, nonemergent follow-up renal CT or MRI could be considered. Electronically signed by: Luke Bun MD 11/06/2024 06:09 PM EST RP Workstation: [id number]   DG Chest Port 1 View Result Date: 11/06/2024 EXAM: 1 VIEW(S) XRAY OF THE CHEST 11/06/2024 04:15:00 PM COMPARISON: 06/07/2023 CLINICAL HISTORY: Questionable sepsis - evaluate for abnormality FINDINGS: LUNGS AND PLEURA: Elevated right hemidiaphragm. No focal pulmonary opacity. No pleural effusion. No pneumothorax. HEART AND MEDIASTINUM: No acute abnormality of the cardiac and mediastinal silhouettes. BONES  AND SOFT TISSUES: Right reverse shoulder arthroplasty noted. Asymmetric spurring of the right acromioclavicular joint. Thoracic spondylosis. Dextroconvex thoracic scoliosis. No acute osseous abnormality. IMPRESSION: 1. No acute cardiopulmonary abnormality. 2. Right reverse shoulder arthroplasty. 3. Elevated right hemidiaphragm. 4. Asymmetric degenerative changes of the right acromioclavicular joint. 5. Thoracic spondylosis. 6. Dextroconvex thoracic scoliosis. Electronically signed by: Ryan Salvage MD 11/06/2024 04:42 PM EST RP Workstation: [id number]    Dialysis Orders:  Center: [facility]  on TTS . 180NRe 4 hours BFR 400 DFR Auto 1.5 EDW 82.8kg 2K 2C AVF 15g , no heparin  Mircera 60mcg IV q 2 weeks- last dose 10/18/24 Sensipar  30mg  PO q HD Calcitriol  2.75mcg PO q HD  Assessment/Plan:  Colitis: On empiric antibiotics per admitting team  ESRD:  Typically on TTS schedule but last HD was Monday due to holiday. She does not appear to be volume overloaded and CXR on admission was clear.  K+, Cr and BUN are stable. No acute indication for HD today. Will reeval in AM.   Hypertension/volume: Hypotensive requiring levophed . No volume overload on exam, has been receiving some IV fluids due to colitis/hypotension. She feels she needs volume removed but has no respiratory distress and any UF would worsen hypotension. Will plan to hold off on HD for now and continue to monitor.   Anemia: Hgb 9.1. ESA recently restarted, will dose with next HD  Metabolic bone disease: Calcium controlled. Hold binders and sensipar  for now until tolerating PO  Nutrition:  On clear liquid diet.   Lucie Collet, PA-C 11/07/2024, 11:36 AM  [facility] Pager: [phone]

## 2024-11-07 NOTE — Consult Note (Signed)
 [facility] CONSULTATION NOTE Requesting: Critical care medicine Primary GI: Dr. Abran Reason for consultation: Abdominal pain, abnormal CT, colitis   HISTORY OF PRESENT ILLNESS:  [name] is a 73 y.o. female, retired endoscopy nurse, well-known to me, with multiple significant medical problems as listed below.  This includes end-stage renal disease on hemodialysis.  She was in her usual state of health until Monday several hours after dialysis when she experienced transient loose stools followed by abrupt severe abdominal pain on her right side.  She was febrile.  Subsequently mated to the ICU.  CT imaging revealed right sided colitis.  The patient does have a history of right sided ischemic colitis.  She tried liquids, but is not tolerating.  No vomiting.  No diarrhea at present.  This reminds her of her prior episode of ischemic colitis.  She was started on pressors and is on antibiotics (Zosyn  and oral vancomycin )  LABORATORIES: Lactic acid 1.4 BUN 46/creatinine 4.35 (on dialysis) WBC 27.4 (down from 34.9) Normal liver tests  CT IMAGING: IMPRESSION: 1. Circumferential wall thickening of the ascending colon with pericolonic stranding, no evidence for perforation. Findings could be secondary to focal colitis of infectious, inflammatory, or ischemic etiology; however, inflammatory colonic neoplasm is also a concern. Recommend correlation with colonoscopy. 2. Diverticular disease of the left colon without evidence of diverticulitis. 3. Markedly distended gallbladder without calcified stone or inflammation 4. Atrophic kidneys. Simple and complex cysts within both kidneys, nonemergent follow-up renal CT or MRI could be considered.   Electronically signed by: Luke Bun MD 11/06/2024 06:09 PM EST RP Workstation: [id number]  REVIEW OF SYSTEMS:  All non-GI ROS negative except for arthritis. Past Medical History:  Diagnosis Date   Anemia    Anxiety    Brain tumor (benign) ([name])     Colitis 2010   microscopic- Dr Abran   Depression    Diabetes mellitus    type II   Dyspnea    with exertion   ESRD (end stage renal disease) ([name])    TTUSAT [address]    Fibromyalgia    GERD (gastroesophageal reflux disease)    Headache    History of blood transfusion    after knee surgery   Hypertension    discontinued all diuretics and antihypertensives   IBS (irritable bowel syndrome)    LBP (low back pain)    Neuropathy    feet bilat    Osteoarthritis    Osteopenia    Pneumonia    hx of 2014    Rotator cuff tear, right    Sinusitis    currently being treated with antibiotic will complete 03/04/2015    Past Surgical History:  Procedure Laterality Date   A/V FISTULAGRAM N/A 02/03/2024   Procedure: A/V Fistulagram;  Surgeon: Norine Manuelita LABOR, MD;  Location: [facility];  Service: Cardiovascular;  Laterality: N/A;   A/V SHUNT INTERVENTION Right 05/30/2024   Procedure: A/V SHUNT INTERVENTION;  Surgeon: Pearline Norman RAMAN, MD;  Location: [facility];  Service: Cardiovascular;  Laterality: Right;   A/V SHUNT INTERVENTION N/A 09/07/2024   Procedure: A/V SHUNT INTERVENTION;  Surgeon: Lanis Fonda BRAVO, MD;  Location: [facility];  Service: Cardiovascular;  Laterality: N/A;   AV FISTULA PLACEMENT Right 09/12/2020   Procedure: RIGHT ARTERIOVENOUS (AV) FISTULA CREATION;  Surgeon: Harvey Carlin BRAVO, MD;  Location: [facility] OR;  Service: Vascular;  Laterality: Right;   BASCILIC VEIN TRANSPOSITION Right 10/27/2020   Procedure: RIGHT UPPER EXTREMITY SECOND STAGE BASCILIC VEIN  TRANSPOSITION;  Surgeon: Harvey Carlin BRAVO, MD;  Location: [facility] OR;  Service: Vascular;  Laterality: Right;   BIOPSY  03/11/2022   Procedure: BIOPSY;  Surgeon: Wilhelmenia Aloha Raddle., MD;  Location: [facility] ENDOSCOPY;  Service: Gastroenterology;;   COLONOSCOPY WITH PROPOFOL  N/A 03/11/2022   Procedure: COLONOSCOPY WITH PROPOFOL ;  Surgeon: Wilhelmenia Aloha Raddle., MD;  Location: [facility] ENDOSCOPY;  Service: Gastroenterology;   Laterality: N/A;   foramen magnum ependymoma surgery  2003   Dr Mora   JOINT REPLACEMENT Bilateral    NASAL SINUS SURGERY     1973    PERIPHERAL VASCULAR BALLOON ANGIOPLASTY  02/03/2024   Procedure: PERIPHERAL VASCULAR BALLOON ANGIOPLASTY;  Surgeon: Norine Manuelita LABOR, MD;  Location: [facility];  Service: Cardiovascular;;  Inflow Basilic Vein   REVERSE SHOULDER ARTHROPLASTY Right 06/03/2022   Procedure: REVERSE SHOULDER ARTHROPLASTY;  Surgeon: Melita Drivers, MD;  Location: [facility] ORS;  Service: Orthopedics;  Laterality: Right;    TONSILLECTOMY     TOTAL KNEE ARTHROPLASTY     L 2008, R 2009, R 2016- Dr hiram   TOTAL KNEE REVISION Right 03/05/2015   Procedure: RIGHT TOTAL KNEE ARTHROPLASTY REVISION;  Surgeon: Dempsey Moan, MD;  Location: [facility] ORS;  Service: Orthopedics;  Laterality: Right;   VENOUS ANGIOPLASTY  05/30/2024   Procedure: VENOUS ANGIOPLASTY;  Surgeon: Pearline Norman RAMAN, MD;  Location: [facility];  Service: Cardiovascular;;  central   VENOUS ANGIOPLASTY  09/07/2024   Procedure: VENOUS ANGIOPLASTY;  Surgeon: Lanis Fonda BRAVO, MD;  Location: [facility];  Service: Cardiovascular;;  innominate 70%    Social History [name]  reports that she quit smoking about 35 years ago. Her smoking use included cigarettes. She started smoking about 55 years ago. She has a 20 pack-year smoking history. She has never used smokeless tobacco. She reports that she does not currently use alcohol . She reports that she does not use drugs.  family history includes Crohn's disease in her maternal uncle; Depression in her mother; Diabetes in her brother, brother, father, mother, and another family member; Heart attack in her brother; Heart disease in her brother and brother; Hyperlipidemia in her brother, brother, and father; Hypertension in her brother, brother, father, and mother; Prostate cancer in her brother; Stroke in her brother; Stroke (age of onset: 63) in her mother.  Allergies   Allergen Reactions   Erythromycin  Diarrhea and Nausea And Vomiting   Gabapentin Other (See Comments)    Confusion and falling  Hallucinations       PHYSICAL EXAMINATION: Vital signs: BP (!) 61/46   Pulse 71   Temp 98.9 F (37.2 C) (Oral)   Resp 14   Ht 5' 1 (1.549 m)   Wt 88.9 kg   SpO2 96%   BMI 37.03 kg/m   Constitutional: Mildly ill-appearing, no acute distress.  Reading a book Psychiatric: alert and oriented x3, cooperative Eyes: extraocular movements intact, anicteric, conjunctiva pink Mouth: oral pharynx moist, no lesions Neck: supple no lymphadenopathy Cardiovascular: heart regular rate and rhythm, no murmur Lungs: clear to auscultation bilaterally Abdomen: soft, moderate right sided tenderness, nondistended, no obvious ascites, no peritoneal signs, normal bowel sounds, no organomegaly Rectal: Omitted Extremities: no clubbing, cyanosis, or lower extremity edema bilaterally.  AV fistula right arm Skin: no relevant lesions on visible extremities Neuro: No focal deficits.  Cranial nerves intact  ASSESSMENT:  1.  Acute ischemic colitis.  This is her second documented case.  Right sided.  Right-sided disease is more complex and raise the  question of superior mesenteric vasculature compromise.  Nice summary from open evidence below.  Right-sided ischemic colitis (IC) refers to ischemic injury that is isolated to the right colon, typically involving the cecum and ascending colon, and is distinguished by a more severe clinical course and worse outcomes compared to left-sided or more diffuse colonic ischemia. While ischemic colitis most commonly affects the left colon, right-sided involvement accounts for about 20-25% of cases and is associated with higher rates of surgery, longer hospital stays, and increased mortality.[1-5] Patients with right-sided IC often present with acute abdominal pain, less frequent rectal bleeding, and more severe systemic symptoms such as nausea,  weight loss, paralytic ileus, and peritoneal signs.[1-3] The etiology of right-sided IC is more likely to be due to occlusive disease of the superior mesenteric artery or its branches, and is frequently seen in patients with comorbidities such as coronary artery disease, chronic kidney disease (especially those on dialysis), and atrial fibrillation.[1][3][6] Right-sided IC is also more likely to be associated with acute mesenteric ischemia, which further worsens prognosis; in these cases, prompt vascular imaging is recommended to assess for arterial occlusion.[2][6] The need for surgical intervention is significantly higher in right-sided IC, and the 30-day mortality rate can approach 20% or higher, especially if acute mesenteric ischemia is present.[1-2][5-6] In summary, right-sided ischemic colitis is a distinct clinical entity characterized by isolated ischemia of the right colon, more severe presentation, higher risk of complications, and worse outcomes compared to other forms of ischemic colitis.    PLAN:  1.  Continue with all supportive care measures 2.  Please obtain a vascular surgery opinion regarding the possibility of occlusive disease of the superior mesenteric artery leading to recurrent right sided ischemic colitis. A total time of 80 minutes was spent reviewing the myriad of data, interviewing the patient, examining the patient, providing recommendations, and documenting clinical information in the health record  Will follow.  Norleen SAILOR. Abran Raddle., M.D. [facility] Division of Gastroenterology

## 2024-11-07 NOTE — Telephone Encounter (Addendum)
 Patient seen on 11/25, sent to ED.  Agree.  She was admitted with sepsis.  Thank you

## 2024-11-08 ENCOUNTER — Inpatient Hospital Stay (HOSPITAL_COMMUNITY)

## 2024-11-08 DIAGNOSIS — K559 Vascular disorder of intestine, unspecified: Secondary | ICD-10-CM | POA: Diagnosis not present

## 2024-11-08 DIAGNOSIS — R579 Shock, unspecified: Secondary | ICD-10-CM | POA: Diagnosis not present

## 2024-11-08 LAB — CBC
HCT: 29.4 % — ABNORMAL LOW (ref 36.0–46.0)
Hemoglobin: 9.5 g/dL — ABNORMAL LOW (ref 12.0–15.0)
MCH: 31 pg (ref 26.0–34.0)
MCHC: 32.3 g/dL (ref 30.0–36.0)
MCV: 96.1 fL (ref 80.0–100.0)
Platelets: 186 K/uL (ref 150–400)
RBC: 3.06 MIL/uL — ABNORMAL LOW (ref 3.87–5.11)
RDW: 14.4 % (ref 11.5–15.5)
WBC: 18.3 K/uL — ABNORMAL HIGH (ref 4.0–10.5)
nRBC: 0 % (ref 0.0–0.2)

## 2024-11-08 LAB — GLUCOSE, CAPILLARY
Glucose-Capillary: 112 mg/dL — ABNORMAL HIGH (ref 70–99)
Glucose-Capillary: 112 mg/dL — ABNORMAL HIGH (ref 70–99)
Glucose-Capillary: 150 mg/dL — ABNORMAL HIGH (ref 70–99)
Glucose-Capillary: 126 mg/dL — ABNORMAL HIGH (ref 70–99)
Glucose-Capillary: 116 mg/dL — ABNORMAL HIGH (ref 70–99)
Glucose-Capillary: 73 mg/dL (ref 70–99)

## 2024-11-08 LAB — BASIC METABOLIC PANEL WITH GFR
Anion gap: 16 — ABNORMAL HIGH (ref 5–15)
BUN: 57 mg/dL — ABNORMAL HIGH (ref 8–23)
CO2: 22 mmol/L (ref 22–32)
Calcium: 8.3 mg/dL — ABNORMAL LOW (ref 8.9–10.3)
Chloride: 92 mmol/L — ABNORMAL LOW (ref 98–111)
Creatinine, Ser: 5.38 mg/dL — ABNORMAL HIGH (ref 0.44–1.00)
GFR, Estimated: 8 mL/min — ABNORMAL LOW (ref >60)
Glucose, Bld: 102 mg/dL — ABNORMAL HIGH (ref 70–99)
Potassium: 4.6 mmol/L (ref 3.5–5.1)
Sodium: 130 mmol/L — ABNORMAL LOW (ref 135–145)

## 2024-11-08 LAB — HEPATITIS B SURFACE ANTIGEN: Hepatitis B Surface Ag: NONREACTIVE

## 2024-11-08 MED ORDER — IOHEXOL 350 MG/ML SOLN
75.0000 mL | Freq: Once | INTRAVENOUS | Status: AC | PRN
  Administered 2024-11-08: 75 mL via INTRAVENOUS

## 2024-11-08 MED ORDER — LORAZEPAM 1 MG PO TABS
1.0000 mg | ORAL_TABLET | Freq: Every day | ORAL | Status: DC | PRN
  Administered 2024-11-11: 1 mg via ORAL
  Filled 2024-11-08 (×2): qty 1

## 2024-11-08 MED ORDER — MIDODRINE HCL 5 MG PO TABS: 10.0000 mg | ORAL_TABLET | Freq: Three times a day (TID) | ORAL | Status: DC

## 2024-11-08 MED ORDER — CHLORHEXIDINE GLUCONATE CLOTH 2 % EX PADS: 6.0000 | MEDICATED_PAD | Freq: Every day | CUTANEOUS | Status: DC

## 2024-11-08 NOTE — Plan of Care (Signed)
 Vascular surgery update  Called for possible concern arterial pathology given recurrent bouts of right sided ischemic colitis.  CTA was obtained and has been reviewed.  The mesenteric arteries are widely patent without evidence of stenosis or atherosclerotic disease. No concern from a vascular perspective regarding the etiology of her inflammatory bowel episodes.  Norman GORMAN Serve MD [facility] Phone Number: [phone] 11/08/2024 6:39 PM

## 2024-11-08 NOTE — Progress Notes (Signed)
 Patient ID: MARASIA NEWHALL, female   DOB: 1951/08/22, 73 y.o.   MRN: [id number] S: Feeling a little better today but had severe abdominal pain after eating yesterday. O:BP (!) 113/48   Pulse 66   Temp 97.6 F (36.4 C) (Oral)   Resp 15   Ht 5' 1 (1.549 m)   Wt 90 kg   SpO2 95%   BMI 37.49 kg/m   Intake/Output Summary (Last 24 hours) at 11/08/2024 0809 Last data filed at 11/08/2024 0700 Gross per 24 hour  Intake 820.43 ml  Output --  Net 820.43 ml   Intake/Output: I/O last 3 completed shifts: In: 2875.9 [I.V.:2576.8; IV Piggyback:299.2] Out: -   Intake/Output this shift:  No intake/output data recorded. Weight change: 7.445 kg Gen: NAD CVS: RRR Resp:CTA Abd: +BS, soft, mildly tender, no guarding or rebound Ext: no edema, RUE AVF +T/B  Recent Labs  Lab 11/06/24 1559 11/07/24 0502 11/08/24 0445  NA 132* 132* 130*  K 4.4 4.0 4.6  CL 90* 92* 92*  CO2 25 22 22   GLUCOSE 172* 81 102*  BUN 41* 46* 57*  CREATININE 4.26* 4.35* 5.38*  ALBUMIN  3.3*  --   --   CALCIUM 9.3 8.5* 8.3*  AST 23  --   --   ALT 20  --   --    Liver Function Tests: Recent Labs  Lab 11/06/24 1559  AST 23  ALT 20  ALKPHOS 102  BILITOT 0.9  PROT 6.8  ALBUMIN  3.3*   No results for input(s): LIPASE, AMYLASE in the last 168 hours. No results for input(s): AMMONIA in the last 168 hours. CBC: Recent Labs  Lab 11/06/24 1559 11/07/24 0502 11/08/24 0445  WBC 34.9* 27.4* 18.3*  NEUTROABS 32.5*  --   --   HGB 11.1* 9.1* 9.5*  HCT 34.6* 28.9* 29.4*  MCV 97.5 98.6 96.1  PLT 216 157 186   Cardiac Enzymes: No results for input(s): CKTOTAL, CKMB, CKMBINDEX, TROPONINI in the last 168 hours. CBG: Recent Labs  Lab 11/07/24 1519 11/07/24 1924 11/07/24 2324 11/08/24 0311 11/08/24 0753  GLUCAP 118* 122* 105* 112* 112*    Iron Studies: No results for input(s): IRON, TIBC, TRANSFERRIN, FERRITIN in the last 72 hours. Studies/Results: CT ABDOMEN PELVIS WO CONTRAST Result  Date: 11/06/2024 EXAM: CT ABDOMEN AND PELVIS WITHOUT CONTRAST 11/06/2024 05:40:33 PM TECHNIQUE: CT of the abdomen and pelvis was performed without the administration of intravenous contrast. Multiplanar reformatted images are provided for review. Automated exposure control, iterative reconstruction, and/or weight-based adjustment of the mA/kV was utilized to reduce the radiation dose to as low as reasonably achievable. COMPARISON: Chest x-ray 11/06/2024, CT chest abdomen pelvis 03/10/2022. CLINICAL HISTORY: Abdominal pain, acute, nonlocalized. FINDINGS: LOWER CHEST: Coronary vascular calcifications. LIVER: The liver is unremarkable. GALLBLADDER AND BILE DUCTS: Distended gallbladder without calcified stone or right upper quadrant inflammation. No biliary ductal dilatation. SPLEEN: No acute abnormality. PANCREAS: Atrophic pancreas without inflammation. ADRENAL GLANDS: No acute abnormality. KIDNEYS, URETERS AND BLADDER: Atrophic kidneys without hydronephrosis. Simple and complex cysts within the bilateral kidneys. Per consensus, no follow-up is needed for simple Bosniak type 1 and 2 renal cysts, unless the patient has a malignancy history or risk factors. No stones in the kidneys or ureters. No perinephric or periureteral stranding. Decompressed urinary bladder. GI AND BOWEL: Stomach demonstrates no acute abnormality. Dense material within the colon. Diverticular disease of the left colon. Circumferential wall thickening of the ascending colon with pericolonic stranding, series 3 image 44. Moderate to large colonic stool  burden. No extraluminal gas to suggest a perforation. There is no bowel obstruction. PERITONEUM AND RETROPERITONEUM: No ascites. No free air. VASCULATURE: Vascular structures are limited without intravenous contrast. Aorta is normal in caliber. LYMPH NODES: Few small right lower quadrant mesenteric nodes measuring up to 9 mm. REPRODUCTIVE ORGANS: No acute abnormality. BONES AND SOFT TISSUES: Scoliosis  and degenerative changes of the spine. No acute osseous abnormality. Moderate fat-containing umbilical hernia. No focal soft tissue abnormality. IMPRESSION: 1. Circumferential wall thickening of the ascending colon with pericolonic stranding, no evidence for perforation. Findings could be secondary to focal colitis of infectious, inflammatory, or ischemic etiology; however, inflammatory colonic neoplasm is also a concern. Recommend correlation with colonoscopy. 2. Diverticular disease of the left colon without evidence of diverticulitis. 3. Markedly distended gallbladder without calcified stone or inflammation 4. Atrophic kidneys. Simple and complex cysts within both kidneys, nonemergent follow-up renal CT or MRI could be considered. Electronically signed by: Luke Bun MD 11/06/2024 06:09 PM EST RP Workstation: [id number]   DG Chest Port 1 View Result Date: 11/06/2024 EXAM: 1 VIEW(S) XRAY OF THE CHEST 11/06/2024 04:15:00 PM COMPARISON: 06/07/2023 CLINICAL HISTORY: Questionable sepsis - evaluate for abnormality FINDINGS: LUNGS AND PLEURA: Elevated right hemidiaphragm. No focal pulmonary opacity. No pleural effusion. No pneumothorax. HEART AND MEDIASTINUM: No acute abnormality of the cardiac and mediastinal silhouettes. BONES AND SOFT TISSUES: Right reverse shoulder arthroplasty noted. Asymmetric spurring of the right acromioclavicular joint. Thoracic spondylosis. Dextroconvex thoracic scoliosis. No acute osseous abnormality. IMPRESSION: 1. No acute cardiopulmonary abnormality. 2. Right reverse shoulder arthroplasty. 3. Elevated right hemidiaphragm. 4. Asymmetric degenerative changes of the right acromioclavicular joint. 5. Thoracic spondylosis. 6. Dextroconvex thoracic scoliosis. Electronically signed by: Ryan Salvage MD 11/06/2024 04:42 PM EST RP Workstation: [id number]    Chlorhexidine  Gluconate Cloth  6 each Topical Daily   heparin  injection (subcutaneous)  5,000 Units Subcutaneous Q8H   insulin   aspart  0-6 Units Subcutaneous Q4H   vancomycin   125 mg Oral QID    BMET    Component Value Date/Time   NA 130 (L) 11/08/2024 0445   K 4.6 11/08/2024 0445   CL 92 (L) 11/08/2024 0445   CO2 22 11/08/2024 0445   GLUCOSE 102 (H) 11/08/2024 0445   BUN 57 (H) 11/08/2024 0445   CREATININE 5.38 (H) 11/08/2024 0445   CALCIUM 8.3 (L) 11/08/2024 0445   GFRNONAA 8 (L) 11/08/2024 0445   GFRAA 66 (L) 03/07/2015 0449   CBC    Component Value Date/Time   WBC 18.3 (H) 11/08/2024 0445   RBC 3.06 (L) 11/08/2024 0445   HGB 9.5 (L) 11/08/2024 0445   HCT 29.4 (L) 11/08/2024 0445   PLT 186 11/08/2024 0445   MCV 96.1 11/08/2024 0445   MCH 31.0 11/08/2024 0445   MCHC 32.3 11/08/2024 0445   RDW 14.4 11/08/2024 0445   LYMPHSABS 1.0 11/06/2024 1559   MONOABS 1.0 11/06/2024 1559   EOSABS 0.0 11/06/2024 1559   BASOSABS 0.3 (H) 11/06/2024 1559   Dialysis Orders:  Center: [facility]  on TTS . 180NRe 4 hours BFR 400 DFR Auto 1.5 EDW 82.8kg 2K 2C AVF 15g , no heparin  Mircera 60mcg IV q 2 weeks- last dose 10/18/24 Sensipar  30mg  PO q HD Calcitriol  2.75mcg PO q HD   Assessment/Plan:  Colitis: On empiric antibiotics per admitting team  ESRD:  Typically on TTS schedule but last HD was Monday due to holiday. She does not appear to be volume overloaded and CXR on admission was clear.  K+, Cr and BUN are  stable. No acute indication for HD today. Will plan for HD tomorrow in the ICU in case of ongoing hypotension.   Hypertension/volume: Hypotensive requiring levophed . No volume overload on exam, has been receiving some IV fluids due to colitis/hypotension. She feels she needs volume removed but has no respiratory distress and any UF would worsen hypotension. PCCM weaning pressors as ablve.   Anemia: Hgb 9.1. ESA recently restarted, will dose with next HD  Metabolic bone disease: Calcium controlled. Hold binders and sensipar  for now until tolerating PO  Nutrition:  On clear liquid diet.   Fairy RONAL Sellar,  MD [facility]

## 2024-11-08 NOTE — Plan of Care (Signed)
  Problem: Coping: Goal: Ability to adjust to condition or change in health will improve Outcome: Progressing   Problem: Fluid Volume: Goal: Ability to maintain a balanced intake and output will improve Outcome: Progressing   Problem: Metabolic: Goal: Ability to maintain appropriate glucose levels will improve Outcome: Progressing   Problem: Skin Integrity: Goal: Risk for impaired skin integrity will decrease Outcome: Progressing   Problem: Tissue Perfusion: Goal: Adequacy of tissue perfusion will improve Outcome: Progressing   Problem: Education: Goal: Knowledge of General Education information will improve Description: Including pain rating scale, medication(s)/side effects and non-pharmacologic comfort measures Outcome: Progressing

## 2024-11-08 NOTE — Plan of Care (Signed)
  Problem: Coping: Goal: Ability to adjust to condition or change in health will improve Outcome: Progressing   Problem: Health Behavior/Discharge Planning: Goal: Ability to identify and utilize available resources and services will improve Outcome: Progressing Goal: Ability to manage health-related needs will improve Outcome: Progressing   Problem: Metabolic: Goal: Ability to maintain appropriate glucose levels will improve Outcome: Progressing   Problem: Education: Goal: Knowledge of General Education information will improve Description: Including pain rating scale, medication(s)/side effects and non-pharmacologic comfort measures Outcome: Progressing   Problem: Health Behavior/Discharge Planning: Goal: Ability to manage health-related needs will improve Outcome: Progressing   Problem: Clinical Measurements: Goal: Ability to maintain clinical measurements within normal limits will improve Outcome: Progressing Goal: Diagnostic test results will improve Outcome: Progressing   Problem: Coping: Goal: Level of anxiety will decrease Outcome: Progressing   Problem: Pain Managment: Goal: General experience of comfort will improve and/or be controlled Outcome: Progressing   Problem: Fluid Volume: Goal: Ability to maintain a balanced intake and output will improve Outcome: Not Progressing   Problem: Nutritional: Goal: Maintenance of adequate nutrition will improve Outcome: Not Progressing   Problem: Elimination: Goal: Will not experience complications related to bowel motility Outcome: Not Progressing Goal: Will not experience complications related to urinary retention Outcome: Not Progressing

## 2024-11-08 NOTE — H&P (Addendum)
 NAME:  [name], MRN:  [id number], DOB:  18-Mar-1951, LOS: 2 ADMISSION DATE:  11/06/2024, CONSULTATION DATE:  11/06/24 REFERRING MD:  EDP, CHIEF COMPLAINT:  febrile   History of Present Illness:  21 year woman female, retired endoscopy nurse, with multiple medical problems including, but not limited to, diabetes mellitus, hypertension, end-stage renal disease on hemodialysis, osteoarthritis, and obesity.  She has been followed in this office for GERD, history of ischemic colitis, and colon cancer screening/surveillance  p/w abd pain, diarrhea, fevers x 2 days.  Hypotensive refractory to fluids, PCCM consulted.  Scan showing circumferential colonic inflammation/thickening colitis vs. Neoplasm.  Has seen GI in March 2024 for elevated ferritin  Pertinent  Medical History     has a past medical history of Anemia, Anxiety, Brain tumor (benign) ([name]), Colitis (2010), Depression, Diabetes mellitus, Dyspnea, ESRD (end stage renal disease) ([name]), Fibromyalgia, GERD (gastroesophageal reflux disease), Headache, History of blood transfusion, Hypertension, IBS (irritable bowel syndrome), LBP (low back pain), Neuropathy, Osteoarthritis, Osteopenia, Pneumonia, Rotator cuff tear, right, and Sinusitis.   has a past surgical history that includes foramen magnum ependymoma surgery (2003); Total knee arthroplasty; Joint replacement (Bilateral); Tonsillectomy; Nasal sinus surgery; Total knee revision (Right, 03/05/2015); AV fistula placement (Right, 09/12/2020); Bascilic vein transposition (Right, 10/27/2020); Colonoscopy with propofol  (N/A, 03/11/2022); biopsy (03/11/2022); Reverse shoulder arthroplasty (Right, 06/03/2022); A/V Fistulagram (N/A, 02/03/2024); PERIPHERAL VASCULAR BALLOON ANGIOPLASTY (02/03/2024); A/V SHUNT INTERVENTION (Right, 05/30/2024); VENOUS ANGIOPLASTY (05/30/2024); VENOUS ANGIOPLASTY (09/07/2024); and A/V SHUNT INTERVENTION (N/A, 09/07/2024).   Significant Hospital Events: Including procedures,  antibiotic start and stop dates in addition to other pertinent events   11/25 admit 11/26- hx retake. Says she is normally constipared and takes miralx. Then after HD 2d ago had abd pain and soft stool needing admit. Reports retd from [facility] Endo unit in 2016 due to knee issues. Anuric and on HD since 2021 or so. At HD BP can drop into 80s and 70. 2d ago left for him with SBP 80s. Here diast low and on 4 Levophed . On 3L [place] pulse ox 97%.   Having abd cramps here.   Interim History / Subjective:   11/27 - down to levophed  and MAP is 70 with sBP > 120. Not had diarrhea at all. Constipated here.  HAving abd pain while  eating - better today, GI concerned for Rt sided ischemic colitiss due to mesentric ischema - recommends VVS consult. Off oxygen. No delirum. Renal plans HD in ICU 11/09/24 and suspects needs levophed  for it   Objective    Blood pressure 120/67, pulse 69, temperature 97.6 F (36.4 C), temperature source Oral, resp. rate 12, height 5' 1 (1.549 m), weight 90 kg, SpO2 95%.        Intake/Output Summary (Last 24 hours) at 11/08/2024 0905 Last data filed at 11/08/2024 0800 Gross per 24 hour  Intake 841.67 ml  Output --  Net 841.67 ml   Filed Weights   11/06/24 2000 11/07/24 0500 11/08/24 0500  Weight: 87 kg 88.9 kg 90 kg    Examination: General Appearance:  Looks well. Obese + Head:  Normocephalic, without obvious abnormality, atraumatic Eyes:  PERRL - yes, conjunctiva/corneas - mudd     Ears:  Normal external ear canals, both ears Nose:  G tube - no Throat:  ETT TUBE - no , OG tube - no Neck:  Supple,  No enlargement/tenderness/nodules Lungs: Clear to auscultation bilaterally, Heart:  S1 and S2 normal, no murmur, CVP - no.  Pressors - no Abdomen:  Soft, no masses, no organomegaly Genitalia / Rectal:  Not done Extremities:  Extremities- intact Skin:  ntact in exposed areas . Sacral area - not examined Neurologic:  Sedation - none -> RASS - +1 . Moves all 4s - yes.  CAM-ICU - neg . Orientation - x3+       Resolved problem list   Assessment and Plan  Acute resp failur - mild, 3L [place] need   11/27//25 - resolved  Plan  - turn o2 off and reassess (90% RA)   Hx of chronic hypotension peri- HD with low diastolic Septic shock v low volume secondary to colitis  11/08/24  - down to 3 levophe dwith SBP > 120 and MAP > 70  Plan  - reduce BP goal to sBP > 105; titrate levophed  as appropraite  - reduce fluid to KV)  Colitis - lactate normal (MRSA PCR -neg) - RT ischemic per GI   -not had stool output since admit. No tests done. On Vanc IV, Zosyn  IV and PO vanc - Per GI features c/w  RT Sided ischemic colitis  Plan - gEt VVS consult - Dr Pearline recommends CTA with and without contrast first (ordered)  - stop IV vanc - sop po vanc - stop GI isolation - cotinue zosyn  for now - consider dc 11/09/24 - paged GI - - trend lactate, consideration for surgical consultation if keeps going up and/or develops peritoneal signs  ESRD on HD (has fistula)  Plan  - Renal Dr Oliver plans fo HD in ICU 11/09/24  DM  - seen by DM coordinator and concern for hypogluycemia 11/07/24  plan - start KVO fluid with D5 in it  - reduce insulin  dosage per DM coordinator  Anemia of ESRD/CKD and critical illness   Plan  - - PRBC for hgb </= 6.9gm%    - exceptions are   -  if ACS susepcted/confirmed then transfuse for hgb </= 8.0gm%,  or    -  active bleeding with hemodynamic instability, then transfuse regardless of hemoglobin value   At at all times try to transfuse 1 unit prbc as possible with exception of active hemorrhage    Dr Irvin check list  Diet: clear liquid Pain/Anxiety/Delirium protocol (if indicated): x VAP protocol (if indicated): x DVT prophylaxis: heparin  sq 5K GI prophylaxis:  pepcid  Glucose control: ssi Mobility: bed rst Code Status: DNR if arrest. No CPR but ok to intubte. Full score of care + Family Communication: patient  at bedids 11/26 and 11/27 Disposition: ICU    ATTESTATION & SIGNATURE   The patient [name] is critically ill with multiple organ systems failure and requires high complexity decision making for assessment and support, frequent evaluation and titration of therapies, application of advanced monitoring technologies and extensive interpretation of multiple databases and discussion with other appropriate health care personnel such as bedside nurses, social workers, case production designer, theatre/television/film, consultants, respiratory therapists, nutritionists, secretaries etc.,  Critical care time includes but is not restricted to just documentation time. Documentation can happen in parallel or sequential to care time depending on case mix urgency and priorities for the shift. So, overall critical Care Time devoted to patient care services described in this note is  30  Minutes.   This time reflects time of care of this signee Dr Dorethia Cave which includ does not reflect procedure time, or teaching time or supervisory time of PA/NP/Med student/Med Resident etc but could involve care discussion time     Dr. Dorethia Cave, M.D.,  F.C.C.P Pulmonary and Critical Care Medicine Staff Physician, [facility] Pager: [phone], If no answer or between  15:00h - 7:00h: call [phone]  11/08/2024 9:14 AM    LABS    PULMONARY Recent Labs  Lab 11/07/24 1402  HCO3 24.3  O2SAT 76    CBC Recent Labs  Lab 11/06/24 1559 11/07/24 0502 11/08/24 0445  HGB 11.1* 9.1* 9.5*  HCT 34.6* 28.9* 29.4*  WBC 34.9* 27.4* 18.3*  PLT 216 157 186    COAGULATION Recent Labs  Lab 11/06/24 1559  INR 1.1    CARDIAC  No results for input(s): TROPONINI in the last 168 hours. No results for input(s): PROBNP in the last 168 hours.   CHEMISTRY Recent Labs  Lab 11/06/24 1559 11/07/24 0502 11/08/24 0445  NA 132* 132* 130*  K 4.4 4.0 4.6  CL 90* 92* 92*  CO2 25 22  22   GLUCOSE 172* 81 102*  BUN 41* 46* 57*  CREATININE 4.26* 4.35* 5.38*  CALCIUM 9.3 8.5* 8.3*   Estimated Creatinine Clearance: 9.7 mL/min (A) (by C-G formula based on SCr of 5.38 mg/dL (H)).   LIVER Recent Labs  Lab 11/06/24 1559  AST 23  ALT 20  ALKPHOS 102  BILITOT 0.9  PROT 6.8  ALBUMIN  3.3*  INR 1.1     INFECTIOUS Recent Labs  Lab 11/06/24 2309 11/07/24 0502 11/07/24 0831  LATICACIDVEN 1.5 1.4 1.4     ENDOCRINE CBG (last 3)  Recent Labs    11/07/24 2324 11/08/24 0311 11/08/24 0753  GLUCAP 105* 112* 112*         IMAGING x48h  - image(s) personally visualized  -   highlighted in bold CT ABDOMEN PELVIS WO CONTRAST Result Date: 11/06/2024 EXAM: CT ABDOMEN AND PELVIS WITHOUT CONTRAST 11/06/2024 05:40:33 PM TECHNIQUE: CT of the abdomen and pelvis was performed without the administration of intravenous contrast. Multiplanar reformatted images are provided for review. Automated exposure control, iterative reconstruction, and/or weight-based adjustment of the mA/kV was utilized to reduce the radiation dose to as low as reasonably achievable. COMPARISON: Chest x-ray 11/06/2024, CT chest abdomen pelvis 03/10/2022. CLINICAL HISTORY: Abdominal pain, acute, nonlocalized. FINDINGS: LOWER CHEST: Coronary vascular calcifications. LIVER: The liver is unremarkable. GALLBLADDER AND BILE DUCTS: Distended gallbladder without calcified stone or right upper quadrant inflammation. No biliary ductal dilatation. SPLEEN: No acute abnormality. PANCREAS: Atrophic pancreas without inflammation. ADRENAL GLANDS: No acute abnormality. KIDNEYS, URETERS AND BLADDER: Atrophic kidneys without hydronephrosis. Simple and complex cysts within the bilateral kidneys. Per consensus, no follow-up is needed for simple Bosniak type 1 and 2 renal cysts, unless the patient has a malignancy history or risk factors. No stones in the kidneys or ureters. No perinephric or periureteral stranding. Decompressed  urinary bladder. GI AND BOWEL: Stomach demonstrates no acute abnormality. Dense material within the colon. Diverticular disease of the left colon. Circumferential wall thickening of the ascending colon with pericolonic stranding, series 3 image 44. Moderate to large colonic stool burden. No extraluminal gas to suggest a perforation. There is no bowel obstruction. PERITONEUM AND RETROPERITONEUM: No ascites. No free air. VASCULATURE: Vascular structures are limited without intravenous contrast. Aorta is normal in caliber. LYMPH NODES: Few small right lower quadrant mesenteric nodes measuring up to 9 mm. REPRODUCTIVE ORGANS: No acute abnormality. BONES AND SOFT TISSUES: Scoliosis and degenerative changes of the spine. No acute osseous abnormality. Moderate fat-containing umbilical hernia. No focal soft tissue abnormality. IMPRESSION: 1. Circumferential wall thickening of  the ascending colon with pericolonic stranding, no evidence for perforation. Findings could be secondary to focal colitis of infectious, inflammatory, or ischemic etiology; however, inflammatory colonic neoplasm is also a concern. Recommend correlation with colonoscopy. 2. Diverticular disease of the left colon without evidence of diverticulitis. 3. Markedly distended gallbladder without calcified stone or inflammation 4. Atrophic kidneys. Simple and complex cysts within both kidneys, nonemergent follow-up renal CT or MRI could be considered. Electronically signed by: Luke Bun MD 11/06/2024 06:09 PM EST RP Workstation: [id number]   DG Chest Port 1 View Result Date: 11/06/2024 EXAM: 1 VIEW(S) XRAY OF THE CHEST 11/06/2024 04:15:00 PM COMPARISON: 06/07/2023 CLINICAL HISTORY: Questionable sepsis - evaluate for abnormality FINDINGS: LUNGS AND PLEURA: Elevated right hemidiaphragm. No focal pulmonary opacity. No pleural effusion. No pneumothorax. HEART AND MEDIASTINUM: No acute abnormality of the cardiac and mediastinal silhouettes. BONES AND SOFT  TISSUES: Right reverse shoulder arthroplasty noted. Asymmetric spurring of the right acromioclavicular joint. Thoracic spondylosis. Dextroconvex thoracic scoliosis. No acute osseous abnormality. IMPRESSION: 1. No acute cardiopulmonary abnormality. 2. Right reverse shoulder arthroplasty. 3. Elevated right hemidiaphragm. 4. Asymmetric degenerative changes of the right acromioclavicular joint. 5. Thoracic spondylosis. 6. Dextroconvex thoracic scoliosis. Electronically signed by: Ryan Salvage MD 11/06/2024 04:42 PM EST RP Workstation: [id number]

## 2024-11-08 NOTE — Progress Notes (Addendum)
 [facility] Progress Note  CC:  Abdominal pain, abnormal CT, colitis    Subjective: She stated feeling much better today.  She is tolerating a clear liquid diet.  No nausea or vomiting.  She continues to have mild abdominal pain to the central upper abdomen and right upper and mid abdomen.  No severe abdominal pain.  She passed a moderate amount of soft brown stool, no melena or bright red blood per the rectum.   Objective:  Vital signs in last 24 hours: Temp:  [97.6 F (36.4 C)-99.6 F (37.6 C)] 97.6 F (36.4 C) (11/27 1121) Pulse Rate:  [65-89] 81 (11/27 1400) Resp:  [7-26] 26 (11/27 1400) BP: (79-129)/(41-73) 121/73 (11/27 1400) SpO2:  [91 %-98 %] 93 % (11/27 1400) Weight:  [90 kg] 90 kg (11/27 0500) Last BM Date : 11/06/24 General: Alert 73 year old female in no acute distress. Heart: Regular rate and rhythm, no murmur. Pulm: Breath sounds clear throughout. Abdomen: Soft, nondistended.  Mild tenderness to the central upper abdomen down to the right upper quadrant to right mid abdomen without rebound or guarding.  Positive bowel sounds to all 4 quadrants.  No bruit.  No palpable mass.  Extremities: No lower extremity edema. Neurologic:  Alert and oriented x 4. Grossly normal neurologically. Psych:  Alert and cooperative. Normal mood and affect.  Intake/Output from previous day: 11/26 0701 - 11/27 0700 In: 1001.3 [I.V.:801.4; IV Piggyback:199.9] Out: -  Intake/Output this shift: Total I/O In: 366.5 [P.O.:240; I.V.:76.5; IV Piggyback:50] Out: -   Lab Results: Recent Labs    11/06/24 1559 11/07/24 0502 11/08/24 0445  WBC 34.9* 27.4* 18.3*  HGB 11.1* 9.1* 9.5*  HCT 34.6* 28.9* 29.4*  PLT 216 157 186   BMET Recent Labs    11/06/24 1559 11/07/24 0502 11/08/24 0445  NA 132* 132* 130*  K 4.4 4.0 4.6  CL 90* 92* 92*  CO2 25 22 22   GLUCOSE 172* 81 102*  BUN 41* 46* 57*  CREATININE 4.26* 4.35* 5.38*  CALCIUM 9.3 8.5* 8.3*   LFT Recent Labs     11/06/24 1559  PROT 6.8  ALBUMIN  3.3*  AST 23  ALT 20  ALKPHOS 102  BILITOT 0.9   PT/INR Recent Labs    11/06/24 1559  LABPROT 15.0  INR 1.1   Hepatitis Panel Recent Labs    11/08/24 0857  HEPBSAG NON REACTIVE    CT ABDOMEN PELVIS WO CONTRAST Result Date: 11/06/2024 EXAM: CT ABDOMEN AND PELVIS WITHOUT CONTRAST 11/06/2024 05:40:33 PM TECHNIQUE: CT of the abdomen and pelvis was performed without the administration of intravenous contrast. Multiplanar reformatted images are provided for review. Automated exposure control, iterative reconstruction, and/or weight-based adjustment of the mA/kV was utilized to reduce the radiation dose to as low as reasonably achievable. COMPARISON: Chest x-ray 11/06/2024, CT chest abdomen pelvis 03/10/2022. CLINICAL HISTORY: Abdominal pain, acute, nonlocalized. FINDINGS: LOWER CHEST: Coronary vascular calcifications. LIVER: The liver is unremarkable. GALLBLADDER AND BILE DUCTS: Distended gallbladder without calcified stone or right upper quadrant inflammation. No biliary ductal dilatation. SPLEEN: No acute abnormality. PANCREAS: Atrophic pancreas without inflammation. ADRENAL GLANDS: No acute abnormality. KIDNEYS, URETERS AND BLADDER: Atrophic kidneys without hydronephrosis. Simple and complex cysts within the bilateral kidneys. Per consensus, no follow-up is needed for simple Bosniak type 1 and 2 renal cysts, unless the patient has a malignancy history or risk factors. No stones in the kidneys or ureters. No perinephric or periureteral stranding. Decompressed urinary bladder. GI AND BOWEL: Stomach demonstrates no acute abnormality.  Dense material within the colon. Diverticular disease of the left colon. Circumferential wall thickening of the ascending colon with pericolonic stranding, series 3 image 44. Moderate to large colonic stool burden. No extraluminal gas to suggest a perforation. There is no bowel obstruction. PERITONEUM AND RETROPERITONEUM: No  ascites. No free air. VASCULATURE: Vascular structures are limited without intravenous contrast. Aorta is normal in caliber. LYMPH NODES: Few small right lower quadrant mesenteric nodes measuring up to 9 mm. REPRODUCTIVE ORGANS: No acute abnormality. BONES AND SOFT TISSUES: Scoliosis and degenerative changes of the spine. No acute osseous abnormality. Moderate fat-containing umbilical hernia. No focal soft tissue abnormality. IMPRESSION: 1. Circumferential wall thickening of the ascending colon with pericolonic stranding, no evidence for perforation. Findings could be secondary to focal colitis of infectious, inflammatory, or ischemic etiology; however, inflammatory colonic neoplasm is also a concern. Recommend correlation with colonoscopy. 2. Diverticular disease of the left colon without evidence of diverticulitis. 3. Markedly distended gallbladder without calcified stone or inflammation 4. Atrophic kidneys. Simple and complex cysts within both kidneys, nonemergent follow-up renal CT or MRI could be considered. Electronically signed by: Luke Bun MD 11/06/2024 06:09 PM EST RP Workstation: [id number]   DG Chest Port 1 View Result Date: 11/06/2024 EXAM: 1 VIEW(S) XRAY OF THE CHEST 11/06/2024 04:15:00 PM COMPARISON: 06/07/2023 CLINICAL HISTORY: Questionable sepsis - evaluate for abnormality FINDINGS: LUNGS AND PLEURA: Elevated right hemidiaphragm. No focal pulmonary opacity. No pleural effusion. No pneumothorax. HEART AND MEDIASTINUM: No acute abnormality of the cardiac and mediastinal silhouettes. BONES AND SOFT TISSUES: Right reverse shoulder arthroplasty noted. Asymmetric spurring of the right acromioclavicular joint. Thoracic spondylosis. Dextroconvex thoracic scoliosis. No acute osseous abnormality. IMPRESSION: 1. No acute cardiopulmonary abnormality. 2. Right reverse shoulder arthroplasty. 3. Elevated right hemidiaphragm. 4. Asymmetric degenerative changes of the right acromioclavicular joint. 5.  Thoracic spondylosis. 6. Dextroconvex thoracic scoliosis. Electronically signed by: Ryan Salvage MD 11/06/2024 04:42 PM EST RP Workstation: [id number]    Assessment / Plan:  73 year old female with acute ischemic colitis. CTAP without contrast showed circumferential wall thickening to the ascending colon with pericolonic stranding suggestive of infectious versus inflammatory versus ischemic etiology. This is her second documented case. Right-sided disease is more complex and raise the question of superior mesenteric vasculature compromise. Her abdominal pain has decreased. She passed a soft brown stool earlier this afternoon. Tolerating a clear liquid diet. Remains on Zosyn  IV.  WBC 27 -> 18.3.  Weaned off Levophed . - Continue clear liquid diet, consider advancing to full liquid tomorrow - Continue Zosyn  IV 8 hours - Vascular surgery consult to rule out the possibility of occlusive disease of the superior mesenteric artery leading to recurrent right sided ischemic colitis. - Abdominal CTA with and without contrast ordered by vascular surgery - Pain management and IV fluids per CCM  Acute on chronic anemia. Hg 11.1 -> 9.1 -> today Hg 9.5. No overt GI bleeding.   ESRD on HD. Last dialysis session was on Monday 11/24.  - Dialysis session planned tomorrow at the bedside in the ICU per nephrology  Hyponatremia. Na+ 130. - Management per nephrology  Principal Problem:   Shock [facility]) Active Problems:   Abnormal CT of the abdomen   Right lower quadrant abdominal pain   Septic shock ([name])     LOS: 2 days   Elida CHRISTELLA Shawl  11/08/2024, 2:44 PM  GI ATTENDING  Interval history and data reviewed.  Patient seen and examined as outlined above.  Agree with interval progress note as outlined above. [name] is doing  better today with less abdominal pain and improving white blood cell count.  No fevers.  Stable vital signs.  We will see how she tolerates her advancing diet.  We appreciate  input from vascular surgery regarding the possibility of mesenteric vascular disease to explain recurrent right sided ischemic colitis.  Will follow.  [name] SAILOR. Abran Raddle., M.D. [facility] Division of Gastroenterology

## 2024-11-09 DIAGNOSIS — K559 Vascular disorder of intestine, unspecified: Secondary | ICD-10-CM | POA: Diagnosis not present

## 2024-11-09 DIAGNOSIS — R579 Shock, unspecified: Secondary | ICD-10-CM | POA: Diagnosis not present

## 2024-11-09 LAB — CBC
HCT: 30.8 % — ABNORMAL LOW (ref 36.0–46.0)
Hemoglobin: 9.7 g/dL — ABNORMAL LOW (ref 12.0–15.0)
MCH: 31.2 pg (ref 26.0–34.0)
MCHC: 31.5 g/dL (ref 30.0–36.0)
MCV: 99 fL (ref 80.0–100.0)
Platelets: 162 K/uL (ref 150–400)
RBC: 3.11 MIL/uL — ABNORMAL LOW (ref 3.87–5.11)
RDW: 14.6 % (ref 11.5–15.5)
WBC: 10.7 K/uL — ABNORMAL HIGH (ref 4.0–10.5)
nRBC: 0 % (ref 0.0–0.2)

## 2024-11-09 LAB — BASIC METABOLIC PANEL WITH GFR
Anion gap: 24 — ABNORMAL HIGH (ref 5–15)
BUN: 68 mg/dL — ABNORMAL HIGH (ref 8–23)
CO2: 16 mmol/L — ABNORMAL LOW (ref 22–32)
Calcium: 8.1 mg/dL — ABNORMAL LOW (ref 8.9–10.3)
Chloride: 88 mmol/L — ABNORMAL LOW (ref 98–111)
Creatinine, Ser: 6.01 mg/dL — ABNORMAL HIGH (ref 0.44–1.00)
GFR, Estimated: 7 mL/min — ABNORMAL LOW (ref >60)
Glucose, Bld: 56 mg/dL — ABNORMAL LOW (ref 70–99)
Potassium: 4.8 mmol/L (ref 3.5–5.1)
Sodium: 128 mmol/L — ABNORMAL LOW (ref 135–145)

## 2024-11-09 LAB — GLUCOSE, CAPILLARY
Glucose-Capillary: 52 mg/dL — ABNORMAL LOW (ref 70–99)
Glucose-Capillary: 49 mg/dL — ABNORMAL LOW (ref 70–99)
Glucose-Capillary: 76 mg/dL (ref 70–99)
Glucose-Capillary: 97 mg/dL (ref 70–99)
Glucose-Capillary: 86 mg/dL (ref 70–99)
Glucose-Capillary: 89 mg/dL (ref 70–99)

## 2024-11-09 MED ORDER — OXYCODONE HCL 5 MG PO TABS
15.0000 mg | ORAL_TABLET | Freq: Four times a day (QID) | ORAL | Status: DC | PRN
  Administered 2024-11-09 – 2024-11-10 (×3): 15 mg via ORAL
  Filled 2024-11-09 (×3): qty 3

## 2024-11-09 MED ORDER — NEPRO/CARBSTEADY PO LIQD: 237.0000 mL | ORAL | Status: DC | PRN

## 2024-11-09 MED ORDER — ALBUMIN HUMAN 25 % IV SOLN: 25.0000 g | Freq: Once | INTRAVENOUS | Status: DC | PRN

## 2024-11-09 MED ORDER — LIDOCAINE HCL (PF) 1 % IJ SOLN: 5.0000 mL | INTRAMUSCULAR | Status: DC | PRN

## 2024-11-09 MED ORDER — ALTEPLASE 2 MG IJ SOLR: 2.0000 mg | Freq: Once | INTRAMUSCULAR | Status: DC | PRN

## 2024-11-09 MED ORDER — PENTAFLUOROPROP-TETRAFLUOROETH EX AERO
INHALATION_SPRAY | CUTANEOUS | Status: AC
Start: 2024-11-09 — End: 2024-11-09
  Filled 2024-11-09: qty 30

## 2024-11-09 MED ORDER — ANTICOAGULANT SODIUM CITRATE 4% (200MG/5ML) IV SOLN: 5.0000 mL | Status: DC | PRN

## 2024-11-09 MED ORDER — LIDOCAINE-PRILOCAINE 2.5-2.5 % EX CREA: 1.0000 | TOPICAL_CREAM | CUTANEOUS | Status: DC | PRN

## 2024-11-09 MED ORDER — HEPARIN SODIUM (PORCINE) 1000 UNIT/ML DIALYSIS: 1000.0000 [IU] | INTRAMUSCULAR | Status: DC | PRN

## 2024-11-09 MED ORDER — ALBUMIN HUMAN 25 % IV SOLN
INTRAVENOUS | Status: AC
  Filled 2024-11-09: qty 200

## 2024-11-09 MED ORDER — PENTAFLUOROPROP-TETRAFLUOROETH EX AERO: 1.0000 | INHALATION_SPRAY | CUTANEOUS | Status: DC | PRN

## 2024-11-09 NOTE — Progress Notes (Addendum)
 [facility] Progress Note  CC:  Abdominal pain, abnormal CT, colitis   Subjective: She is tolerating a renal diet.  No nausea or vomiting.  She continues to have mild central upper and right sided abdominal pain which she stated is less when compared to yesterday.  She passed a loose brown stool within the past 30 minutes.  No bloody or black stools.  No chest pain or shortness of breath.  She underwent hemodialysis today, blood pressure remained stable.  RN at the bedside to transfer her to the medical floor.   Objective:  Vital signs in last 24 hours: Temp:  [97.7 F (36.5 C)-97.9 F (36.6 C)] 97.9 F (36.6 C) (11/28 1225) Pulse Rate:  [62-83] 70 (11/28 1230) Resp:  [9-30] 12 (11/28 1230) BP: (94-153)/(30-73) 144/56 (11/28 1230) SpO2:  [87 %-97 %] 94 % (11/28 1230) Weight:  [88.9 kg] 88.9 kg (11/28 0815) Last BM Date : 11/09/24 General: Alert 73 year old female no acute distress. Heart: Regular rate and rhythm, no murmurs. Pulm: Breath sounds clear throughout.  On room air. Abdomen: Soft, nondistended.  Mild tenderness to the central upper abdomen into the right upper quadrant to right mid abdomen without rebound or guarding.  Positive bowel sounds to all 4 quadrants. Extremities: No lower extremity edema. Neurologic:  Alert and oriented x 4. Grossly normal neurologically. Psych:  Alert and cooperative. Normal mood and affect.  Intake/Output from previous day: 11/27 0701 - 11/28 0700 In: 786.1 [P.O.:480; I.V.:76.5; IV Piggyback:229.6] Out: -  Intake/Output this shift: Total I/O In: 7.1 [IV Piggyback:7.1] Out: 3500 [Other:3500]  Lab Results: Recent Labs    11/07/24 0502 11/08/24 0445 11/09/24 0146  WBC 27.4* 18.3* 10.7*  HGB 9.1* 9.5* 9.7*  HCT 28.9* 29.4* 30.8*  PLT 157 186 162   BMET Recent Labs    11/07/24 0502 11/08/24 0445 11/09/24 0146  NA 132* 130* 128*  K 4.0 4.6 4.8  CL 92* 92* 88*  CO2 22 22 16*  GLUCOSE 81 102* 56*  BUN 46*  57* 68*  CREATININE 4.35* 5.38* 6.01*  CALCIUM 8.5* 8.3* 8.1*   LFT Recent Labs    11/06/24 1559  PROT 6.8  ALBUMIN  3.3*  AST 23  ALT 20  ALKPHOS 102  BILITOT 0.9   PT/INR Recent Labs    11/06/24 1559  LABPROT 15.0  INR 1.1   Hepatitis Panel Recent Labs    11/08/24 0857  HEPBSAG NON REACTIVE    CT ANGIO ABDOMEN W &/OR WO CONTRAST Result Date: 11/08/2024 CLINICAL DATA:  Aortic atherosclerosis. EXAM: CT ANGIOGRAPHY ABDOMEN TECHNIQUE: Multidetector CT imaging of the abdomen was performed using the standard protocol during bolus administration of intravenous contrast. Multiplanar reconstructed images and MIPs were obtained and reviewed to evaluate the vascular anatomy. RADIATION DOSE REDUCTION: This exam was performed according to the departmental dose-optimization program which includes automated exposure control, adjustment of the mA and/or kV according to patient size and/or use of iterative reconstruction technique. CONTRAST:  75mL OMNIPAQUE  IOHEXOL  350 MG/ML SOLN COMPARISON:  CT, 11/06/2024, performed for abdominal pain. FINDINGS: VASCULAR Aorta: Aorta normal in caliber. Mild aortic atherosclerosis. No dissection or stenosis. Celiac: Patent without evidence of aneurysm, dissection, vasculitis or significant stenosis. SMA: Patent without evidence of aneurysm, dissection, vasculitis or significant stenosis. Renals: Small dual right renal arteries. Mild calcified plaque at the origin of the larger of the 2, without significant stenosis. Single left renal artery without significant stenosis. IMA: Widely patent inferior mesenteric artery. Inflow: Widely patent common  iliac arteries. Veins: No obvious venous abnormality within the limitations of this arterial phase study. Review of the MIP images confirms the above findings. NON-VASCULAR Lower chest: Trace pleural effusions.  Mild dependent atelectasis. Hepatobiliary: Liver is unremarkable. Gallbladder is distended. No wall thickening or  adjacent inflammation. No stones. No bile duct dilation. Pancreas: Atrophy, otherwise unremarkable. No pancreatic ductal dilatation or surrounding inflammatory changes. Spleen: Normal in size without focal abnormality. Adrenals/Urinary Tract: No adrenal mass. Bilateral renal atrophy. 3 cm left midpole renal cyst. No stones. No hydronephrosis. Visualized ureters are unremarkable. Stomach/Bowel: Concentric wall thickening of the ascending colon with mild adjacent inflammatory haziness as well as a prominent mesenteric lymph nodes. These findings are stable from the recent prior exam. No bowel dilation to suggest obstruction. No other wall thickening or inflammatory changes. Lymphatic: Prominent lymph nodes adjacent to the affected ascending colon. No other evidence of adenopathy. Other: Partly imaged midline fat containing hernia, stable from the previous exam. Musculoskeletal: No fracture or acute finding.  No bone lesion. IMPRESSION: VASCULAR 1. Mild aortic atherosclerosis. No aneurysm. No dissection. No hemodynamically significant stenosis of the aorta or evidence branch vessels. NON-VASCULAR 1. Bilateral renal atrophy with associated small renal arteries. 2. Right colon circumferential wall thickening with associated inflammation and prominent adjacent mesenteric lymph nodes. This is unchanged compared to the exam from 2 days ago. Differential diagnosis again includes neoplastic disease and infection/inflammation. Electronically Signed   By: Alm Parkins M.D.   On: 11/08/2024 15:52    Assessment / Plan:   73 year old female with acute ischemic colitis. CTAP without contrast showed circumferential wall thickening to the ascending colon with pericolonic stranding suggestive of infectious versus inflammatory versus ischemic etiology. This is her second documented case. Right-sided disease is more complex and raise the question of superior mesenteric vasculature compromise. Seen by vascular surgery. CTA showed  the mesenteric arteries are widely patent without evidence of stenosis or atherosclerotic disease. No concern from a vascular perspective regarding the etiology of her inflammatory bowel episodes. However, she may have small vessel disease or spasm associated with transient hypoperfusion (particularly during/post dialysis). Her abdominal pain has decreased.  Weaned off Levophed .  Diet advanced. WBC 18.3 -> 10.7. Afebrile.  Patient to transfer to the medical floor. - Renal diet - Continue Zosyn  IV 8 hours, complete antibiotic x 7 days - Pain management and IV fluids per CCM - Okay to continue Ozempic , as this medication does not cause ischemic colitis - Await further recommendations per Dr.   Acute on chronic anemia. Hg 11.1 -> 9.1 -> Hg 9.5 -> today Hg 9.7. No overt GI bleeding.    ESRD on HD. Last dialysis session was on Monday 11/24.  - Dialysis session planned today at the bedside in the ICU per nephrology   Hyponatremia. Na+ 130 -> 128. - Management per nephrology  Principal Problem:   [facility]) Active Problems:   Abnormal CT of the abdomen   Right lower quadrant abdominal pain   Septic shock ([name])     LOS: 3 days   Elida HERO Kennedy-Smith  11/09/2024, 1:46 PM  GI ATTENDING  Interval history data reviewed.  Patient seen and examined as outlined above.  Agree with interval progress note as outlined above. The patient continues to make positive progress in terms of labs and clinically.  Continue with all supportive measures, antibiotics, and ongoing observation.  Agree with advancing diet.  Agree with moving to regular medical floor.  Norleen SAILOR. Abran Raddle., M.D. [facility] Division of  Gastroenterology

## 2024-11-09 NOTE — Progress Notes (Addendum)
 Received patient in bed to unit.  Alert and oriented.  Informed consent signed and in chart.   TX duration:3.5, bedside  Patient tolerated well.  Transported back to the room  Alert, without acute distress.  Hand-off given to patient's nurse.   Access used: R upper arm fistula Access issues: none  Total UF removed: 3.5L   11/09/24 1225  Vitals  Temp 97.9 F (36.6 C)  Temp Source Oral  BP (!) 144/62  MAP (mmHg) 81  Pulse Rate 70  ECG Heart Rate 68  Resp 15  Oxygen Therapy  SpO2 97 %  O2 Device Room Air  During Treatment Monitoring  Duration of HD Treatment -hour(s) 3.5 hour(s)  HD Safety Checks Performed Yes  Intra-Hemodialysis Comments Tx completed  Post Treatment  Dialyzer Clearance Lightly streaked  Liters Processed 84  Fluid Removed (mL) 3500 mL  Tolerated HD Treatment Yes  AVG/AVF Arterial Site Held (minutes) 7 minutes  AVG/AVF Venous Site Held (minutes) 7 minutes  Fistula / Graft Right Upper arm Arteriovenous fistula  Placement Date/Time: (c) 09/12/20 1316   Placed prior to admission: No  Orientation: Right  Access Location: Upper arm  Access Type: Arteriovenous fistula  Site Condition No complications  Fistula / Graft Assessment Present;Thrill;Bruit  Status Patent;Deaccessed  Drainage Description None     Camellia Brasil LPN [facility]

## 2024-11-09 NOTE — Plan of Care (Signed)
  Problem: Education: Goal: Ability to describe self-care measures that may prevent or decrease complications (Diabetes Survival Skills Education) will improve Outcome: Progressing   Problem: Coping: Goal: Ability to adjust to condition or change in health will improve Outcome: Progressing   Problem: Health Behavior/Discharge Planning: Goal: Ability to identify and utilize available resources and services will improve Outcome: Progressing Goal: Ability to manage health-related needs will improve Outcome: Progressing   Problem: Nutritional: Goal: Progress toward achieving an optimal weight will improve Outcome: Progressing   Problem: Skin Integrity: Goal: Risk for impaired skin integrity will decrease Outcome: Progressing   Problem: Tissue Perfusion: Goal: Adequacy of tissue perfusion will improve Outcome: Progressing   Problem: Clinical Measurements: Goal: Ability to maintain clinical measurements within normal limits will improve Outcome: Progressing Goal: Diagnostic test results will improve Outcome: Progressing Goal: Respiratory complications will improve Outcome: Progressing Goal: Cardiovascular complication will be avoided Outcome: Progressing   Problem: Elimination: Goal: Will not experience complications related to bowel motility Outcome: Progressing   Problem: Pain Managment: Goal: General experience of comfort will improve and/or be controlled Outcome: Progressing   Problem: Safety: Goal: Ability to remain free from injury will improve Outcome: Progressing   Problem: Skin Integrity: Goal: Risk for impaired skin integrity will decrease Outcome: Progressing   Problem: Fluid Volume: Goal: Ability to maintain a balanced intake and output will improve Outcome: Not Progressing   Problem: Metabolic: Goal: Ability to maintain appropriate glucose levels will improve Outcome: Not Progressing   Problem: Nutritional: Goal: Maintenance of adequate nutrition  will improve Outcome: Not Progressing   Problem: Elimination: Goal: Will not experience complications related to urinary retention Outcome: Not Progressing

## 2024-11-09 NOTE — Progress Notes (Addendum)
 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  She has been followed in this office for GERD, history of ischemic colitis, and colon cancer screening/surveillance  p/w abd pain, diarrhea, fevers x 2 days.  Hypotensive refractory to fluids, PCCM consulted.  Scan showing circumferential colonic inflammation/thickening colitis vs. Neoplasm.  Has seen GI in March 2024 for elevated ferritin  Pertinent  Medical History     has a past medical history of Anemia, Anxiety, Brain tumor (benign) ([name]), Colitis (2010), Depression, Diabetes mellitus, Dyspnea, ESRD (end stage renal disease) ([name]), Fibromyalgia, GERD (gastroesophageal reflux disease), Headache, History of blood transfusion, Hypertension, IBS (irritable bowel syndrome), LBP (low back pain), Neuropathy, Osteoarthritis, Osteopenia, Pneumonia, Rotator cuff tear, right, and Sinusitis.   has a past surgical history that includes foramen magnum ependymoma surgery (2003); Total knee arthroplasty; Joint replacement (Bilateral); Tonsillectomy; Nasal sinus surgery; Total knee revision (Right, 03/05/2015); AV fistula placement (Right, 09/12/2020); Bascilic vein transposition (Right, 10/27/2020); Colonoscopy with propofol  (N/A, 03/11/2022); biopsy (03/11/2022); Reverse shoulder arthroplasty (Right, 06/03/2022); A/V Fistulagram (N/A, 02/03/2024); PERIPHERAL VASCULAR BALLOON ANGIOPLASTY (02/03/2024); A/V SHUNT INTERVENTION (Right, 05/30/2024); VENOUS ANGIOPLASTY (05/30/2024); VENOUS ANGIOPLASTY (09/07/2024); and A/V SHUNT INTERVENTION (N/A, 09/07/2024).   Significant Hospital Events: Including procedures,  antibiotic start and stop dates in addition to other pertinent events   11/25 admit 11/26- hx retake. Says she is normally constipared and takes miralx. Then after HD 2d ago had abd pain and soft stool needing admit. Reports retd from [facility] Endo unit in 2016 due to knee issues. Anuric and on HD since 2021 or so. At HD BP can drop into 80s and 70. 2d ago left for him with SBP 80s. Here diast low and on 4 Levophed . On 3L [place] pulse ox 97%.   Having abd cramps here.   11/27 - down to levophed  and MAP is 70 with sBP > 120. Not had diarrhea at all. Constipated here.  HAving abd pain while  eating - better today, GI concerned for Rt sided ischemic colitiss due to mesentric ischema - recommends VVS consult. Off oxygen. No delirum. Renal plans HD in ICU 11/10/24 and suspects needs levophed  for it  - VVS Consult after rCTA abd/pelves CTA was obtained and has been reviewed.  The mesenteric arteries are widely patent without evidence of stenosis or atherosclerotic disease. No concern from a vascular perspective regarding the etiology of her inflammatory bowel episodes.    Interim History / Subjective:   11/28 - On room air. Seems to be off prssors. NO fever. BT with 120-140 systolic and diast in 50s. Plan for HD 11/09/2024 in ICU - >tolerating it well so far. However, did have BM x 3 overnight and large this AM. Nursing reports stool as soft and mushy. OVerall feeling better but still with abd p[aoin   - Per GI Dr Abran:   - Ozempic  is not the cause of her right sided ischemic colitis. Thus, okay to continue 2. No significant stenosis of large portion of SMA. Thus, she may have small vessel disease or spasm associated with transient hypoperfusion (particularly during/post dialysis). 3. Ongoing treatment is supportive. She is trending in the right direction. 4. Advance diet as tolerated  Objective    Blood pressure (!) 142/55, pulse 65, temperature 97.8 F (36.6 C), temperature source Oral, resp. rate  15, height 5' 1 (1.549 m), weight 88.9 kg, SpO2 93%.        Intake/Output Summary (Last 24 hours) at 11/09/2024 0915 Last data filed at 11/09/2024 0600 Gross per 24 hour  Intake 743.62 ml  Output --  Net 743.62 ml   Filed Weights   11/08/24 0500 11/09/24 0500 11/09/24 0815  Weight: 90 kg 88.9 kg 88.9 kg    Examination: General Appearance:  Looks well. Getgin HD. Reading book. BP fine Head:  Normocephalic, without obvious abnormality, atraumatic Eyes:  PERRL - yes, conjunctiva/corneas - muddy     Ears:  Normal external ear canals, both ears Nose:  G tube - no Throat:  ETT TUBE - no , OG tube - no Neck:  Supple,  No enlargement/tenderness/nodules Lungs: Clear to auscultation bilaterally,  Heart:  S1 and S2 normal, no murmur, CVP - no.  Pressors - no Abdomen:  Soft, no masses, no organomegaly Genitalia / Rectal:  Not done Extremities:  Extremities- intact Skin:  ntact in exposed areas . Sacral area - no Neurologic:  Sedation - none -> RASS - +! SABRA Moves all 4s - yes. CAM-ICU - neg . Orientation - x3+         Resolved problem list   Assessment and Plan  Acute resp failur - mild, 3L [place] need   11/27//25 - resolved  Plan  - turn o2 off and reassess (90% RA)   Hx of chronic hypotension peri- HD with low diastolic Septic shock v low volume secondary to colitis  11/08/24  - down to 3 levophe dwith SBP > 120 and MAP > 70 11/28 - off pressors  Plan  - reduce BP goal to sBP > 105 -dc levophed  off MAR  Colitis - lactate normal (MRSA PCR -neg) - RT ischemic per GI . On OZEMPIC    -not had stool output since admit. No tests done. On Vanc IV, Zosyn  IV and PO vanc - Per GI features c/w  RT Sided ischemic colitis - CT agnion 11/27 - no evidence of mesentic iscemia - 11/28 - having stool again. Off PO VANC. OFF IV vanc. Per GI: OZEMPIC  not cuase of ischemic coliti   Plan - HOLD OFF OZEMPIC  - till colitis resolved though per GI this Is not cuase of Colitis  -re-rder  stool GI PCR   - check stool CALPROTECTIN - Zosyn  -. Per GI given severe colitis and fevers do antibiotic for  1 week since start (stop date written 11/09/24) - docusate prn, Miralax  prn - Pepcid  IV  - on clear  liquid diet -> advance to renal diet (d/w GI) - nepro liquids   ESRD on HD (has fistula)  11/28 - tolerating it well in ICU without BP drop (at HD suite drops BP per hx)  Plan  - Renal Dr Oliver plans fo HD in ICU 11/09/24 - move to floor 11/28 afte rHD  DM with obesity - actos .ozempic   - seen by DM coordinator and concern for hypogluycemia 11/07/24 - no low sugar  plan  - off ssi an dmonitor - Hold Actos   - Hold OZEMPIC  (need gi opinion if it caused colitis(  Hyperlipidiea - on REPATHA   Plann  - continue to hold repatha  for now - start when feasible  Anemia of ESRD/CKD and critical illness  11/28 - no bleeding  Plan  - - PRBC for hgb </=  6.9gm%    - exceptions are   -  if ACS susepcted/confirmed then transfuse for hgb </= 8.0gm%,  or    -  active bleeding with hemodynamic instability, then transfuse regardless of hemoglobin value   At at all times try to transfuse 1 unit prbc as possible with exception of active hemorrhage   Anxiety /Depression at home - on wellbutrin  and prozac  and ativan   Plan - check QTc on 12 lead 11/09/2024 and if ok  restart wellbutrin  and prozacn 11/09/24 or 11/10/24 - now on 1mg  Ativan  prn  Chronic PAin - back, Fibromyalgi and neuropathy - on Flexeril , oxycodone  15mg  QID PRN,  Ativan    - 11/28 : Pill revie shows she ws taking oxy regularly  Plan - restart home oxy - consider flexieril start 11/10/24   on PILOCARPINEbut ? indication  Plan  - consider restart 11/10/24   Dr Irvin check list  Diet: clear liquid -> advance if ok with GI Pain/Anxiety/Delirium protocol (if indicated): x VAP protocol (if indicated):  x DVT prophylaxis: heparin  sq 5K GI prophylaxis:  pepcid  (hom ePPI) Glucose control: ssi Mobility:  bed rst Code Status: DNR if arrest. No CPR but ok to intubte. Full score of care + Family Communication:  - patient at bedids 11/26 and 11/27 an d11/28  Disposition: ICU -> move to med surg with TRH primary from 11/10/24 dw/ Dr Arlice     ATTESTATION & SIGNATURE     Dr. Dorethia Cave, M.D., [facility].C.P Pulmonary and Critical Care Medicine Staff Physician, [facility] [place] [facility] Pager: [phone], If no answer or between  15:00h - 7:00h: call [phone]  11/09/2024 9:15 AM    LABS    PULMONARY Recent Labs  Lab 11/07/24 1402  HCO3 24.3  O2SAT 76    CBC Recent Labs  Lab 11/07/24 0502 11/08/24 0445 11/09/24 0146  HGB 9.1* 9.5* 9.7*  HCT 28.9* 29.4* 30.8*  WBC 27.4* 18.3* 10.7*  PLT 157 186 162    COAGULATION Recent Labs  Lab 11/06/24 1559  INR 1.1    CARDIAC  No results for input(s): TROPONINI in the last 168 hours. No results for input(s): PROBNP in the last 168 hours.   CHEMISTRY Recent Labs  Lab 11/06/24 1559 11/07/24 0502 11/08/24 0445 11/09/24 0146  NA 132* 132* 130* 128*  K 4.4 4.0 4.6 4.8  CL 90* 92* 92* 88*  CO2 25 22 22  16*  GLUCOSE 172* 81 102* 56*  BUN 41* 46* 57* 68*  CREATININE 4.26* 4.35* 5.38* 6.01*  CALCIUM 9.3 8.5* 8.3* 8.1*   Estimated Creatinine Clearance: 8.6 mL/min (A) (by C-G formula based on SCr of 6.01 mg/dL (H)).   LIVER Recent Labs  Lab 11/06/24 1559  AST 23  ALT 20  ALKPHOS 102  BILITOT 0.9  PROT 6.8  ALBUMIN  3.3*  INR 1.1     INFECTIOUS Recent Labs  Lab 11/06/24 2309 11/07/24 0502 11/07/24 0831  LATICACIDVEN 1.5 1.4 1.4     ENDOCRINE CBG (last 3)  Recent Labs    11/09/24 0452 11/09/24 0609 11/09/24 0707  GLUCAP 76 97 86         IMAGING x48h  - image(s) personally visualized  -   highlighted in bold CT ANGIO ABDOMEN W &/OR WO CONTRAST Result Date: 11/08/2024 CLINICAL DATA:  Aortic atherosclerosis. EXAM: CT ANGIOGRAPHY ABDOMEN  TECHNIQUE: Multidetector CT imaging of the abdomen was performed using the standard protocol during bolus administration of  intravenous contrast. Multiplanar reconstructed images and MIPs were obtained and reviewed to evaluate the vascular anatomy. RADIATION DOSE REDUCTION: This exam was performed according to the departmental dose-optimization program which includes automated exposure control, adjustment of the mA and/or kV according to patient size and/or use of iterative reconstruction technique. CONTRAST:  75mL OMNIPAQUE  IOHEXOL  350 MG/ML SOLN COMPARISON:  CT, 11/06/2024, performed for abdominal pain. FINDINGS: VASCULAR Aorta: Aorta normal in caliber. Mild aortic atherosclerosis. No dissection or stenosis. Celiac: Patent without evidence of aneurysm, dissection, vasculitis or significant stenosis. SMA: Patent without evidence of aneurysm, dissection, vasculitis or significant stenosis. Renals: Small dual right renal arteries. Mild calcified plaque at the origin of the larger of the 2, without significant stenosis. Single left renal artery without significant stenosis. IMA: Widely patent inferior mesenteric artery. Inflow: Widely patent common iliac arteries. Veins: No obvious venous abnormality within the limitations of this arterial phase study. Review of the MIP images confirms the above findings. NON-VASCULAR Lower chest: Trace pleural effusions.  Mild dependent atelectasis. Hepatobiliary: Liver is unremarkable. Gallbladder is distended. No wall thickening or adjacent inflammation. No stones. No bile duct dilation. Pancreas: Atrophy, otherwise unremarkable. No pancreatic ductal dilatation or surrounding inflammatory changes. Spleen: Normal in size without focal abnormality. Adrenals/Urinary Tract: No adrenal mass. Bilateral renal atrophy. 3 cm left midpole renal cyst. No stones. No hydronephrosis. Visualized ureters are unremarkable. Stomach/Bowel: Concentric wall thickening of the ascending colon with mild  adjacent inflammatory haziness as well as a prominent mesenteric lymph nodes. These findings are stable from the recent prior exam. No bowel dilation to suggest obstruction. No other wall thickening or inflammatory changes. Lymphatic: Prominent lymph nodes adjacent to the affected ascending colon. No other evidence of adenopathy. Other: Partly imaged midline fat containing hernia, stable from the previous exam. Musculoskeletal: No fracture or acute finding.  No bone lesion. IMPRESSION: VASCULAR 1. Mild aortic atherosclerosis. No aneurysm. No dissection. No hemodynamically significant stenosis of the aorta or evidence branch vessels. NON-VASCULAR 1. Bilateral renal atrophy with associated small renal arteries. 2. Right colon circumferential wall thickening with associated inflammation and prominent adjacent mesenteric lymph nodes. This is unchanged compared to the exam from 2 days ago. Differential diagnosis again includes neoplastic disease and infection/inflammation. Electronically Signed   By: Alm Parkins M.D.   On: 11/08/2024 15:52

## 2024-11-09 NOTE — Procedures (Signed)
 I was present at this dialysis session. I have reviewed the session itself and made appropriate changes.  Bp stable off of pressors.  Hopeful transfer out of ICU later today.  Vital signs in last 24 hours:  Temp:  [97.6 F (36.4 C)-97.8 F (36.6 C)] 97.8 F (36.6 C) (11/28 0815) Pulse Rate:  [62-83] 64 (11/28 0930) Resp:  [11-30] 13 (11/28 0930) BP: (94-142)/(30-73) 141/52 (11/28 0930) SpO2:  [87 %-97 %] 93 % (11/28 0930) Weight:  [88.9 kg] 88.9 kg (11/28 0815) Weight change: -1.1 kg Filed Weights   11/08/24 0500 11/09/24 0500 11/09/24 0815  Weight: 90 kg 88.9 kg 88.9 kg    Recent Labs  Lab 11/09/24 0146  NA 128*  K 4.8  CL 88*  CO2 16*  GLUCOSE 56*  BUN 68*  CREATININE 6.01*  CALCIUM 8.1*    Recent Labs  Lab 11/06/24 1559 11/07/24 0502 11/08/24 0445 11/09/24 0146  WBC 34.9* 27.4* 18.3* 10.7*  NEUTROABS 32.5*  --   --   --   HGB 11.1* 9.1* 9.5* 9.7*  HCT 34.6* 28.9* 29.4* 30.8*  MCV 97.5 98.6 96.1 99.0  PLT 216 157 186 162    Scheduled Meds:  Chlorhexidine  Gluconate Cloth  6 each Topical Daily   heparin  injection (subcutaneous)  5,000 Units Subcutaneous Q8H   Continuous Infusions:  albumin  human     albumin  human     anticoagulant sodium citrate     famotidine  (PEPCID ) IV Stopped (11/08/24 0941)   norepinephrine  (LEVOPHED ) Adult infusion Stopped (11/08/24 0916)   piperacillin -tazobactam (ZOSYN )  IV Stopped (11/09/24 0559)   PRN Meds:.acetaminophen , albumin  human, albumin  human, alteplase , anticoagulant sodium citrate, docusate sodium , feeding supplement (NEPRO CARB STEADY), heparin , HYDROmorphone  (DILAUDID ) injection, lidocaine  (PF), lidocaine -prilocaine , LORazepam , ondansetron  (ZOFRAN ) IV, mouth rinse, pentafluoroprop-tetrafluoroeth, polyethylene glycol   Fairy DELENA Sellar,  MD 11/09/2024, 9:36 AM

## 2024-11-09 NOTE — Plan of Care (Signed)
  Problem: Education: Goal: Ability to describe self-care measures that may prevent or decrease complications (Diabetes Survival Skills Education) will improve Outcome: Progressing   Problem: Fluid Volume: Goal: Ability to maintain a balanced intake and output will improve Outcome: Progressing   Problem: Skin Integrity: Goal: Risk for impaired skin integrity will decrease Outcome: Progressing

## 2024-11-10 DIAGNOSIS — R1031 Right lower quadrant pain: Secondary | ICD-10-CM | POA: Diagnosis not present

## 2024-11-10 DIAGNOSIS — K559 Vascular disorder of intestine, unspecified: Secondary | ICD-10-CM | POA: Diagnosis not present

## 2024-11-10 DIAGNOSIS — R579 Shock, unspecified: Secondary | ICD-10-CM | POA: Diagnosis not present

## 2024-11-10 LAB — CBC
HCT: 28.5 % — ABNORMAL LOW (ref 36.0–46.0)
Hemoglobin: 9.2 g/dL — ABNORMAL LOW (ref 12.0–15.0)
MCH: 31 pg (ref 26.0–34.0)
MCHC: 32.3 g/dL (ref 30.0–36.0)
MCV: 96 fL (ref 80.0–100.0)
Platelets: 181 K/uL (ref 150–400)
RBC: 2.97 MIL/uL — ABNORMAL LOW (ref 3.87–5.11)
RDW: 14.4 % (ref 11.5–15.5)
WBC: 7.8 K/uL (ref 4.0–10.5)
nRBC: 0 % (ref 0.0–0.2)

## 2024-11-10 LAB — HEPATITIS B SURFACE ANTIBODY, QUANTITATIVE: Hep B S AB Quant (Post): 29.8 m[IU]/mL

## 2024-11-10 LAB — BASIC METABOLIC PANEL WITH GFR
Anion gap: 17 — ABNORMAL HIGH (ref 5–15)
BUN: 35 mg/dL — ABNORMAL HIGH (ref 8–23)
CO2: 22 mmol/L (ref 22–32)
Calcium: 8.5 mg/dL — ABNORMAL LOW (ref 8.9–10.3)
Chloride: 92 mmol/L — ABNORMAL LOW (ref 98–111)
Creatinine, Ser: 4.53 mg/dL — ABNORMAL HIGH (ref 0.44–1.00)
GFR, Estimated: 10 mL/min — ABNORMAL LOW (ref >60)
Glucose, Bld: 66 mg/dL — ABNORMAL LOW (ref 70–99)
Potassium: 4 mmol/L (ref 3.5–5.1)
Sodium: 131 mmol/L — ABNORMAL LOW (ref 135–145)

## 2024-11-10 MED ORDER — BUPROPION HCL ER (XL) 150 MG PO TB24
300.0000 mg | ORAL_TABLET | Freq: Every morning | ORAL | Status: DC
  Administered 2024-11-10 – 2024-11-12 (×3): 300 mg via ORAL
  Filled 2024-11-10 (×3): qty 2

## 2024-11-10 MED ORDER — FLUOXETINE HCL 20 MG PO CAPS
40.0000 mg | ORAL_CAPSULE | Freq: Every morning | ORAL | Status: DC
  Administered 2024-11-10 – 2024-11-12 (×3): 40 mg via ORAL
  Filled 2024-11-10 (×3): qty 2

## 2024-11-10 NOTE — Hospital Course (Signed)
 73 y.o. F with ESRD on HD TThS, DM, HTN, obesity who was sent from clinic due to fever.  Found in the ER to have abdominal pain, diarrhea, fever and hypotension refractory to fluids.  Admitted to ICU on pressors.

## 2024-11-10 NOTE — Progress Notes (Signed)
 [name] Progress Note   Subjective:   Has been transferred out of ICU. Tolerated HD yesterday with 3.5L UF. She denies SOB, CP, dizziness, nausea. Stable chronic L ankle edema. Reports she continues to have diarrhea after eating.   Objective Vitals:   11/09/24 1743 11/09/24 2022 11/10/24 0421 11/10/24 0727  BP: (!) 134/50 (!) 179/75 (!) 108/41 (!) 157/64  Pulse: 91 83 70 78  Resp: 18 18 18 18   Temp: 98.2 F (36.8 C) (!) 97.4 F (36.3 C) 97.8 F (36.6 C) 98.3 F (36.8 C)  TempSrc: Oral     SpO2: 92% 99% 95% 99%  Weight:      Height:       Physical Exam General: alert female in NAD Heart: RRR, no murmurs, rubs or gallops Lungs: CTA bilaterally, respirations unlabored on RA Abdomen: Soft, non-distended, +BS Extremities: trace non-pitting edema L ankle, no RLE edema Dialysis Access:  AVF + bruising, + bruit  Additional Objective Labs: Basic Metabolic Panel: Recent Labs  Lab 11/08/24 0445 11/09/24 0146 11/10/24 0449  NA 130* 128* 131*  K 4.6 4.8 4.0  CL 92* 88* 92*  CO2 22 16* 22  GLUCOSE 102* 56* 66*  BUN 57* 68* 35*  CREATININE 5.38* 6.01* 4.53*  CALCIUM 8.3* 8.1* 8.5*   Liver Function Tests: Recent Labs  Lab 11/06/24 1559  AST 23  ALT 20  ALKPHOS 102  BILITOT 0.9  PROT 6.8  ALBUMIN  3.3*   No results for input(s): LIPASE, AMYLASE in the last 168 hours. CBC: Recent Labs  Lab 11/06/24 1559 11/07/24 0502 11/08/24 0445 11/09/24 0146 11/10/24 0449  WBC 34.9* 27.4* 18.3* 10.7* 7.8  NEUTROABS 32.5*  --   --   --   --   HGB 11.1* 9.1* 9.5* 9.7* 9.2*  HCT 34.6* 28.9* 29.4* 30.8* 28.5*  MCV 97.5 98.6 96.1 99.0 96.0  PLT 216 157 186 162 181   Blood Culture    Component Value Date/Time   SDES BLOOD SITE NOT SPECIFIED 11/06/2024 1608   SPECREQUEST  11/06/2024 1608    BOTTLES DRAWN AEROBIC AND ANAEROBIC Blood Culture results may not be optimal due to an inadequate volume of blood received in culture bottles   CULT  11/06/2024 1608     NO GROWTH 3 DAYS Performed at [facility], [address], [place], [place] [address]    REPTSTATUS PENDING 11/06/2024 1608    Cardiac Enzymes: No results for input(s): CKTOTAL, CKMB, CKMBINDEX, TROPONINI in the last 168 hours. CBG: Recent Labs  Lab 11/09/24 0434 11/09/24 0452 11/09/24 0609 11/09/24 0707 11/09/24 1117  GLUCAP 49* 76 97 86 89   Iron Studies: No results for input(s): IRON, TIBC, TRANSFERRIN, FERRITIN in the last 72 hours. @lablastinr3 @ Studies/Results: CT ANGIO ABDOMEN W &/OR WO CONTRAST Result Date: 11/08/2024 CLINICAL DATA:  Aortic atherosclerosis. EXAM: CT ANGIOGRAPHY ABDOMEN TECHNIQUE: Multidetector CT imaging of the abdomen was performed using the standard protocol during bolus administration of intravenous contrast. Multiplanar reconstructed images and MIPs were obtained and reviewed to evaluate the vascular anatomy. RADIATION DOSE REDUCTION: This exam was performed according to the departmental dose-optimization program which includes automated exposure control, adjustment of the mA and/or kV according to patient size and/or use of iterative reconstruction technique. CONTRAST:  75mL OMNIPAQUE  IOHEXOL  350 MG/ML SOLN COMPARISON:  CT, 11/06/2024, performed for abdominal pain. FINDINGS: VASCULAR Aorta: Aorta normal in caliber. Mild aortic atherosclerosis. No dissection or stenosis. Celiac: Patent without evidence of aneurysm, dissection, vasculitis or significant stenosis. SMA:  Patent without evidence of aneurysm, dissection, vasculitis or significant stenosis. Renals: Small dual right renal arteries. Mild calcified plaque at the origin of the larger of the 2, without significant stenosis. Single left renal artery without significant stenosis. IMA: Widely patent inferior mesenteric artery. Inflow: Widely patent common iliac arteries. Veins: No obvious venous abnormality within the limitations of this arterial phase study. Review of the MIP images  confirms the above findings. NON-VASCULAR Lower chest: Trace pleural effusions.  Mild dependent atelectasis. Hepatobiliary: Liver is unremarkable. Gallbladder is distended. No wall thickening or adjacent inflammation. No stones. No bile duct dilation. Pancreas: Atrophy, otherwise unremarkable. No pancreatic ductal dilatation or surrounding inflammatory changes. Spleen: Normal in size without focal abnormality. Adrenals/Urinary Tract: No adrenal mass. Bilateral renal atrophy. 3 cm left midpole renal cyst. No stones. No hydronephrosis. Visualized ureters are unremarkable. Stomach/Bowel: Concentric wall thickening of the ascending colon with mild adjacent inflammatory haziness as well as a prominent mesenteric lymph nodes. These findings are stable from the recent prior exam. No bowel dilation to suggest obstruction. No other wall thickening or inflammatory changes. Lymphatic: Prominent lymph nodes adjacent to the affected ascending colon. No other evidence of adenopathy. Other: Partly imaged midline fat containing hernia, stable from the previous exam. Musculoskeletal: No fracture or acute finding.  No bone lesion. IMPRESSION: VASCULAR 1. Mild aortic atherosclerosis. No aneurysm. No dissection. No hemodynamically significant stenosis of the aorta or evidence branch vessels. NON-VASCULAR 1. Bilateral renal atrophy with associated small renal arteries. 2. Right colon circumferential wall thickening with associated inflammation and prominent adjacent mesenteric lymph nodes. This is unchanged compared to the exam from 2 days ago. Differential diagnosis again includes neoplastic disease and infection/inflammation. Electronically Signed   By: Alm Parkins M.D.   On: 11/08/2024 15:52   Medications:  albumin  human     albumin  human     anticoagulant sodium citrate     famotidine  (PEPCID ) IV 100 mL/hr at 11/09/24 1037   piperacillin -tazobactam (ZOSYN )  IV 2.25 g (11/10/24 0651)    Chlorhexidine  Gluconate Cloth  6  each Topical Daily   heparin  injection (subcutaneous)  5,000 Units Subcutaneous Q8H    Dialysis Orders: Center: [facility]  on TTS . 180NRe 4 hours BFR 400 DFR Auto 1.5 EDW 82.8kg 2K 2C AVF 15g , no heparin  Mircera 60mcg IV q 2 weeks- last dose 10/18/24 Sensipar  30mg  PO q HD Calcitriol  2.75mcg PO q HD  Assessment/Plan:  Colitis: On empiric antibiotics per admitting team  ESRD:  Typically on TTS schedule but had HD Saturday, Sunday, Monday, Friday this week due to access infiltration, holiday and hospital admission. No acute indication for HD today and she has had 3.5 HD sessions this week. Will tentatively plan for short HD on Monday then resume TTS schedule, will adjust plan as needed based on volume status.   Hypertension/volume: Initially hypotensive requiring levophed , now out of ICU and intermittently hypertensive. No volume overload on exam and had 3.5L UF yesterday. 2kg over EDW by weights here but she reports ongoing diarrhea and poor PO intake.   Anemia: Hgb 9.2. Due for ESA, will order with next HD  Metabolic bone disease: Calcium controlled. Hold binders and sensipar  for now until tolerating PO  Nutrition:  advancing as tolerated per primary team/GI  Lucie Collet, PA-C 11/10/2024, 9:46 AM  [facility] Pager: [phone]

## 2024-11-10 NOTE — Progress Notes (Signed)
  Progress Note   Patient: [name] FMW:[id number] DOB: [birth year] DOA: 11/06/2024     4 DOS: the patient was seen and examined on 11/10/2024        Brief hospital course: 73 y.o. F with ESRD on HD TThS, DM, HTN, obesity who was sent from clinic due to fever.  Found in the ER to have abdominal pain, diarrhea, fever and hypotension refractory to fluids.  Admitted to ICU on pressors.     Assessment and Plan: Acute colitis From my reading of GI notes, differential remains quite unclear Per her report, she is still having multiple bowel movements per day, two since 7 AM, second one watery.  She still has right-sided abdominal cramping.  No fever. - Follow-up fecal calprotectin - Follow GI panel - Continue Zosyn   End-stage renal disease - Routine dialysis per nephrology - MBD therapy per Nephrology  Diabetes Glucose normal -Hold glimepiride  and pioglitazone  - Continue sliding scale corrections  Chronic hypotension Blood pressure trending up - Hold home midodrine   Class II obesity BMI 35.3, complicates care  Anxiety Chronic pain syndrome - Resume fluoxetine , bupropion  -Continue home oxycodone  and lorazepam   Normocytic anemia Stable, due to chronic kidney disease  Hyponatremia Mild, asymptomatic - Okay to resume SSRI      Subjective: 2 bowel movements since this morning, watery, no blood reported.  No fever, no confusion, no chest symptoms.     Physical Exam: BP (!) 157/64 (BP Location: Right Leg)   Pulse 78   Temp 98.3 F (36.8 C)   Resp 18   Ht 5' 1 (1.549 m)   Wt 84.8 kg   SpO2 99%   BMI 35.32 kg/m   Adult female, sitting up in bed, interactive and appropriate No peripheral edema Respiratory rate seems easy and unlabored Abdomen soft, moderate tenderness on the right side, no tenderness in the left, no guarding or rigidity Attention normal, affect appropriate, moves upper extremities with normal strength and coordination, speech  fluent    Data Reviewed: Basic metabolic panel shows hyponatremia, otherwise consistent with ESRD CBC shows resolved leukocytosis, mild anemia Discussed with GI team   Family Communication:     Disposition: Status is: Inpatient         Author: Lonni SHAUNNA Dalton, MD 11/10/2024 11:45 AM  For on call review [email or web address].

## 2024-11-10 NOTE — Plan of Care (Signed)
  Problem: Education: Goal: Ability to describe self-care measures that may prevent or decrease complications (Diabetes Survival Skills Education) will improve Outcome: Progressing   Problem: Coping: Goal: Ability to adjust to condition or change in health will improve Outcome: Progressing   Problem: Health Behavior/Discharge Planning: Goal: Ability to identify and utilize available resources and services will improve Outcome: Progressing   Problem: Metabolic: Goal: Ability to maintain appropriate glucose levels will improve Outcome: Progressing   Problem: Nutritional: Goal: Progress toward achieving an optimal weight will improve Outcome: Progressing

## 2024-11-10 NOTE — Evaluation (Addendum)
 Physical Therapy Evaluation Patient Details Name: [name] MRN: [id number] DOB: February 13, 1951 Today's Date: 11/10/2024  History of Present Illness  73 yo female presents to [facility] 11/06/24 with acute ischemic colitis. 11/26-11/28 ICU due to hypotension and septic shock. PMH: DM, HTN, ESRD on HD, OA, obese, brain tumor (benign) fibromyalgia, IBS, neuropathy, rotator cuff tear (R)   Clinical Impression  PTA pt was ModI with use of trekking pole. Pt reported being at baseline requiring ModI to stand and to ambulate 143ft with use of IV pole. Pt had a wide BOS with increased medial/lateral sway and antalgic appearing gait pattern. Pt was steady with no challenges to balance. Unable to further assess balance due to need to return to the room for bowel urgency. Pt will have intermittent assist available upon d/c home. Recommending post-acute OP PT to address deficits listed below. However, pt reported needing no further acute or post-acute PT services as she is at baseline. Acute PT signing off. Please re-consult if new needs arise.      If plan is discharge home, recommend the following: Assist for transportation;Help with stairs or ramp for entrance   Can travel by private vehicle    Yes    Equipment Recommendations None recommended by PT     Functional Status Assessment Patient has had a recent decline in their functional status and demonstrates the ability to make significant improvements in function in a reasonable and predictable amount of time.     Precautions / Restrictions Precautions Precautions: Fall Recall of Precautions/Restrictions: Intact Restrictions Weight Bearing Restrictions Per Provider Order: No      Mobility  Bed Mobility  General bed mobility comments: NT, in chair upon arrival    Transfers Overall transfer level: Modified independent Equipment used:  (IV pole)  General transfer comment: wanted to use IV pole instead of trekking pole     Ambulation/Gait Ambulation/Gait assistance: Modified independent (Device/Increase time) Gait Distance (Feet): 150 Feet Assistive device: IV Pole Gait Pattern/deviations: Step-through pattern, Decreased stride length, Antalgic, Wide base of support Gait velocity: decr     General Gait Details: antalgic appearing gait pattern with wide BOS and increased medial/lateral sway. Steady with no overt LOB    Balance Overall balance assessment: Needs assistance, History of Falls Sitting-balance support: No upper extremity supported, Feet supported Sitting balance-Leahy Scale: Good     Standing balance support: Single extremity supported, Reliant on assistive device for balance Standing balance-Leahy Scale: Poor Standing balance comment: reliant on 1UE support        Pertinent Vitals/Pain Pain Assessment Pain Assessment: No/denies pain    Home Living Family/patient expects to be discharged to:: Private residence Living Arrangements: Alone Available Help at Discharge: Friend(s);Available PRN/intermittently;Family Type of Home: Other(Comment) (condo) Home Access: Elevator    Home Layout: One level Home Equipment: BSC/3in1;Rolling [employer] (2 wheels);Other (comment) (trecking pole) Additional Comments: has BSC in shower if she needs to sit    Prior Function Prior Level of Function : Independent/Modified Independent;Driving;History of Falls (last six months)    Mobility Comments: ModI with trekking pole. One fall about a week ago due to losing balance. ADLs Comments: Ind     Extremity/Trunk Assessment   Upper Extremity Assessment Upper Extremity Assessment: Defer to OT evaluation    Lower Extremity Assessment Lower Extremity Assessment: RLE deficits/detail;LLE deficits/detail RLE Deficits / Details: Hip flexion 4/5, Knee ext 5/5, Ankle DF 5/5 RLE Sensation: decreased light touch;history of peripheral neuropathy (alert to light touch, reports diminished sensation worse  distally) LLE  Deficits / Details: Hip flexion 4/5, Knee ext 5/5, Ankle DF 5/5 LLE Sensation: decreased light touch;history of peripheral neuropathy (alert to light touch, reports diminished sensation worse distally)    Cervical / Trunk Assessment Cervical / Trunk Assessment: Normal  Communication   Communication Communication: No apparent difficulties    Cognition Arousal: Alert Behavior During Therapy: WFL for tasks assessed/performed   PT - Cognitive impairments: No apparent impairments  Following commands: Intact       Cueing Cueing Techniques: Verbal cues     General Comments General comments (skin integrity, edema, etc.): VSS on RA     PT Assessment Patient does not need any further PT services         PT Goals (Current goals can be found in the Care Plan section)  Acute Rehab PT Goals PT Goal Formulation: All assessment and education complete, DC therapy     AM-PAC PT 6 Clicks Mobility  Outcome Measure Help needed turning from your back to your side while in a flat bed without using bedrails?: None Help needed moving from lying on your back to sitting on the side of a flat bed without using bedrails?: None Help needed moving to and from a bed to a chair (including a wheelchair)?: None Help needed standing up from a chair using your arms (e.g., wheelchair or bedside chair)?: None Help needed to walk in hospital room?: None Help needed climbing 3-5 steps with a railing? : None 6 Click Score: 24    End of Session   Activity Tolerance: Patient tolerated treatment well Patient left: in chair;with call bell/phone within reach Nurse Communication: Mobility status PT Visit Diagnosis: Other abnormalities of gait and mobility (R26.89);History of falling (Z91.81);Unsteadiness on feet (R26.81);Muscle weakness (generalized) (M62.81)    Time: 9069-9047 PT Time Calculation (min) (ACUTE ONLY): 22 min   Charges:   PT Evaluation $PT Eval Low Complexity: 1 Low   PT  General Charges $$ ACUTE PT VISIT: 1 Visit       Kate ORN, PT, DPT Secure Chat Preferred  Rehab Office [phone]   Kate BRAVO [name] 11/10/2024, 10:45 AM

## 2024-11-10 NOTE — Plan of Care (Signed)
   Problem: Education: Goal: Knowledge of General Education information will improve Description: Including pain rating scale, medication(s)/side effects and non-pharmacologic comfort measures Outcome: Progressing   Problem: Clinical Measurements: Goal: Respiratory complications will improve Outcome: Progressing   Problem: Nutrition: Goal: Adequate nutrition will be maintained Outcome: Progressing

## 2024-11-10 NOTE — Progress Notes (Signed)
 GI PROGRESS NOTE  HISTORY OF PRESENT ILLNESS:  [name] is a 73 y.o. female with multiple significant medical problems who was admitted to the hospital with acute abdominal pain.  Found to have right sided ischemic colitis.  She has had this in the past.  She was evaluated by vascular surgery.  CT angio was negative for dominant mesenteric vascular lesion.  Her risk factor for recurrent right sided colitis is dialysis.  She is stable today on the hospital ward.  Looks comfortable.  Gradually tolerating diet.  Moving bowels.  Ambulating.    Laboratories: WBC 7.8  REVIEW OF SYSTEMS:  All non-GI ROS negative except for foot pain  Past Medical History:  Diagnosis Date   Anemia    Anxiety    Brain tumor (benign) ([name])    Colitis 2010   microscopic- Dr Abran   Depression    Diabetes mellitus    type II   Dyspnea    with exertion   ESRD (end stage renal disease) ([name])    TTUSAT [address]    Fibromyalgia    GERD (gastroesophageal reflux disease)    Headache    History of blood transfusion    after knee surgery   Hypertension    discontinued all diuretics and antihypertensives   IBS (irritable bowel syndrome)    LBP (low back pain)    Neuropathy    feet bilat    Osteoarthritis    Osteopenia    Pneumonia    hx of 2014    Rotator cuff tear, right    Sinusitis    currently being treated with antibiotic will complete 03/04/2015    Past Surgical History:  Procedure Laterality Date   A/V FISTULAGRAM N/A 02/03/2024   Procedure: A/V Fistulagram;  Surgeon: Norine Manuelita LABOR, MD;  Location: [facility];  Service: Cardiovascular;  Laterality: N/A;   A/V SHUNT INTERVENTION Right 05/30/2024   Procedure: A/V SHUNT INTERVENTION;  Surgeon: Pearline Norman RAMAN, MD;  Location: [facility];  Service: Cardiovascular;  Laterality: Right;   A/V SHUNT INTERVENTION N/A 09/07/2024   Procedure: A/V SHUNT INTERVENTION;  Surgeon: Lanis Fonda BRAVO, MD;  Location: [facility];  Service:  Cardiovascular;  Laterality: N/A;   AV FISTULA PLACEMENT Right 09/12/2020   Procedure: RIGHT ARTERIOVENOUS (AV) FISTULA CREATION;  Surgeon: Harvey Carlin BRAVO, MD;  Location: [facility] OR;  Service: Vascular;  Laterality: Right;   BASCILIC VEIN TRANSPOSITION Right 10/27/2020   Procedure: RIGHT UPPER EXTREMITY SECOND STAGE BASCILIC VEIN TRANSPOSITION;  Surgeon: Harvey Carlin BRAVO, MD;  Location: [facility] OR;  Service: Vascular;  Laterality: Right;   BIOPSY  03/11/2022   Procedure: BIOPSY;  Surgeon: Wilhelmenia Aloha Raddle., MD;  Location: [facility] ENDOSCOPY;  Service: Gastroenterology;;   COLONOSCOPY WITH PROPOFOL  N/A 03/11/2022   Procedure: COLONOSCOPY WITH PROPOFOL ;  Surgeon: Wilhelmenia Aloha Raddle., MD;  Location: [facility] ENDOSCOPY;  Service: Gastroenterology;  Laterality: N/A;   foramen magnum ependymoma surgery  2003   Dr Mora   JOINT REPLACEMENT Bilateral    NASAL SINUS SURGERY     1973    PERIPHERAL VASCULAR BALLOON ANGIOPLASTY  02/03/2024   Procedure: PERIPHERAL VASCULAR BALLOON ANGIOPLASTY;  Surgeon: Norine Manuelita LABOR, MD;  Location: [facility];  Service: Cardiovascular;;  Inflow Basilic Vein   REVERSE SHOULDER ARTHROPLASTY Right 06/03/2022   Procedure: REVERSE SHOULDER ARTHROPLASTY;  Surgeon: Melita Drivers, MD;  Location: [facility] ORS;  Service: Orthopedics;  Laterality: Right;    TONSILLECTOMY     TOTAL  KNEE ARTHROPLASTY     L 2008, R 2009, R 2016- Dr hiram   TOTAL KNEE REVISION Right 03/05/2015   Procedure: RIGHT TOTAL KNEE ARTHROPLASTY REVISION;  Surgeon: Dempsey Moan, MD;  Location: [facility] ORS;  Service: Orthopedics;  Laterality: Right;   VENOUS ANGIOPLASTY  05/30/2024   Procedure: VENOUS ANGIOPLASTY;  Surgeon: Pearline Norman RAMAN, MD;  Location: [facility];  Service: Cardiovascular;;  central   VENOUS ANGIOPLASTY  09/07/2024   Procedure: VENOUS ANGIOPLASTY;  Surgeon: Lanis Fonda BRAVO, MD;  Location: [facility];  Service: Cardiovascular;;  innominate 70%    Social History [name]  reports that  she quit smoking about 35 years ago. Her smoking use included cigarettes. She started smoking about 55 years ago. She has a 20 pack-year smoking history. She has never used smokeless tobacco. She reports that she does not currently use alcohol . She reports that she does not use drugs.  family history includes Crohn's disease in her maternal uncle; Depression in her mother; Diabetes in her brother, brother, father, mother, and another family member; Heart attack in her brother; Heart disease in her brother and brother; Hyperlipidemia in her brother, brother, and father; Hypertension in her brother, brother, father, and mother; Prostate cancer in her brother; Stroke in her brother; Stroke (age of onset: 48) in her mother.  Allergies  Allergen Reactions   Erythromycin  Diarrhea and Nausea And Vomiting   Gabapentin Other (See Comments)    Confusion and falling  Hallucinations       PHYSICAL EXAMINATION: Vital signs: BP (!) 157/64 (BP Location: Right Leg)   Pulse 78   Temp 98.3 F (36.8 C)   Resp 18   Ht 5' 1 (1.549 m)   Wt 84.8 kg   SpO2 99%   BMI 35.32 kg/m  General: Well-developed, well-nourished, no acute distress HEENT: Sclerae are anicteric, conjunctiva pink. Oral mucosa intact Lungs: Clear Heart: Regular Abdomen: soft, very mild right-sided tenderness, nondistended, no obvious ascites, no peritoneal signs, normal bowel sounds. No organomegaly. Extremities: No edema.  Hematoma right upper extremity Psychiatric: alert and oriented x3. Cooperative   ASSESSMENT:  1.  Acute right-sided ischemic colitis.  Improving.  No fevers.  Leukocytosis resolved 2.  Multiple medical problems   PLAN:  1.  Advance diet as tolerated 2.  Continue antibiotics for hospitalization 3.  Suspect she will be ready to go home in a day or 2. GI available as needed.  Will sign off.  Norleen SAILOR. Abran Raddle., M.D. [facility] Division of Gastroenterology

## 2024-11-10 NOTE — Evaluation (Signed)
 Occupational Therapy Evaluation Patient Details Name: [name] MRN: [id number] DOB: 03-28-1951 Today's Date: 11/10/2024   History of Present Illness   73 yo female presents to [facility] 11/06/24 with acute ischemic colitis. 11/26-11/28 ICU due to hypotension and septic shock. PMH: DM, HTN, ESRD on HD, OA, obese, brain tumor (benign) fibromyalgia, IBS, neuropathy, rotator cuff tear (R)     Clinical Impressions PTA, pt living alone and is modified independent. Upon eval, pt reporting she is at her baseline. Receptive to education regarding energy conservation at request of her evaluating PT, reviewed 4 P's and compensatory techniques for LB ADL and bed mobility. No further needs identified. OT to sign off.      If plan is discharge home, recommend the following:   Other (comment) (on pt request)     Functional Status Assessment   Patient has had a recent decline in their functional status and demonstrates the ability to make significant improvements in function in a reasonable and predictable amount of time.     Equipment Recommendations   None recommended by OT     Recommendations for Other Services         Precautions/Restrictions   Precautions Precautions: Fall Recall of Precautions/Restrictions: Intact Restrictions Weight Bearing Restrictions Per Provider Order: No     Mobility Bed Mobility Overal bed mobility: Modified Independent                  Transfers Overall transfer level: Modified independent Equipment used:  (IV pole)               General transfer comment: wanted to use IV pole instead of trekking pole      Balance Overall balance assessment: Needs assistance, History of Falls Sitting-balance support: No upper extremity supported, Feet supported Sitting balance-Leahy Scale: Good     Standing balance support: Single extremity supported, Reliant on assistive device for balance Standing balance-Leahy Scale: Poor Standing  balance comment: reliant on 1UE support                           ADL either performed or assessed with clinical judgement   ADL Overall ADL's : Modified independent                                             Vision Patient Visual Report: No change from baseline       Perception         Praxis         Pertinent Vitals/Pain Pain Assessment Pain Assessment: No/denies pain     Extremity/Trunk Assessment Upper Extremity Assessment Upper Extremity Assessment: Generalized weakness (not formally assessed)   Lower Extremity Assessment Lower Extremity Assessment: Defer to PT evaluation   Cervical / Trunk Assessment Cervical / Trunk Assessment: Normal   Communication Communication Communication: No apparent difficulties   Cognition Arousal: Alert Behavior During Therapy: WFL for tasks assessed/performed Cognition: No apparent impairments                               Following commands: Intact       Cueing  General Comments   Cueing Techniques: Verbal cues  VSS on RA   Exercises     Shoulder Instructions      Home Living Family/patient expects  to be discharged to:: Private residence Living Arrangements: Alone Available Help at Discharge: Friend(s);Available PRN/intermittently;Family Type of Home: Other(Comment) (condo) Home Access: Elevator     Home Layout: One level     Bathroom Shower/Tub: Producer, Television/film/video: Standard     Home Equipment: Teacher, English As A Foreign Language (2 wheels);Other (comment) (trecking pole)   Additional Comments: has BSC in shower if she needs to sit      Prior Functioning/Environment Prior Level of Function : Independent/Modified Independent;Driving;History of Falls (last six months)             Mobility Comments: ModI with trekking pole. One fall about a week ago due to losing balance. ADLs Comments: Ind    OT Problem List: Decreased strength;Decreased activity  tolerance;Impaired balance (sitting and/or standing);Pain   OT Treatment/Interventions:        OT Goals(Current goals can be found in the care plan section)   Acute Rehab OT Goals Patient Stated Goal: get better OT Goal Formulation: With patient Time For Goal Achievement: 11/24/24   OT Frequency:       Co-evaluation              AM-PAC OT 6 Clicks Daily Activity     Outcome Measure Help from another person eating meals?: None Help from another person taking care of personal grooming?: None Help from another person toileting, which includes using toliet, bedpan, or urinal?: None Help from another person bathing (including washing, rinsing, drying)?: None Help from another person to put on and taking off regular upper body clothing?: None Help from another person to put on and taking off regular lower body clothing?: None 6 Click Score: 24   End of Session Nurse Communication: Mobility status  Activity Tolerance: Patient tolerated treatment well Patient left: in bed;with call bell/phone within reach  OT Visit Diagnosis: Unsteadiness on feet (R26.81);Pain                Time: 8642-8580 OT Time Calculation (min): 22 min Charges:  OT General Charges $OT Visit: 1 Visit OT Evaluation $OT Eval Low Complexity: 1 Low  Elma JONETTA Lebron FREDERICK, OTR/L [facility] Acute Rehabilitation Office: [phone]   Elma JONETTA Lebron 11/10/2024, 2:49 PM

## 2024-11-11 DIAGNOSIS — R579 Shock, unspecified: Secondary | ICD-10-CM | POA: Diagnosis not present

## 2024-11-11 LAB — GASTROINTESTINAL PANEL BY PCR, STOOL (REPLACES STOOL CULTURE)

## 2024-11-11 LAB — CBC
HCT: 27.5 % — ABNORMAL LOW (ref 36.0–46.0)
Hemoglobin: 8.7 g/dL — ABNORMAL LOW (ref 12.0–15.0)
MCH: 31 pg (ref 26.0–34.0)
MCHC: 31.6 g/dL (ref 30.0–36.0)
MCV: 97.9 fL (ref 80.0–100.0)
Platelets: 205 K/uL (ref 150–400)
RBC: 2.81 MIL/uL — ABNORMAL LOW (ref 3.87–5.11)
RDW: 14.4 % (ref 11.5–15.5)
WBC: 7.4 K/uL (ref 4.0–10.5)
nRBC: 0 % (ref 0.0–0.2)

## 2024-11-11 LAB — BASIC METABOLIC PANEL WITH GFR
Anion gap: 18 — ABNORMAL HIGH (ref 5–15)
BUN: 43 mg/dL — ABNORMAL HIGH (ref 8–23)
CO2: 20 mmol/L — ABNORMAL LOW (ref 22–32)
Calcium: 8.6 mg/dL — ABNORMAL LOW (ref 8.9–10.3)
Chloride: 91 mmol/L — ABNORMAL LOW (ref 98–111)
Creatinine, Ser: 5.86 mg/dL — ABNORMAL HIGH (ref 0.44–1.00)
GFR, Estimated: 7 mL/min — ABNORMAL LOW (ref >60)
Glucose, Bld: 107 mg/dL — ABNORMAL HIGH (ref 70–99)
Potassium: 3.7 mmol/L (ref 3.5–5.1)
Sodium: 129 mmol/L — ABNORMAL LOW (ref 135–145)

## 2024-11-11 LAB — CULTURE, BLOOD (ROUTINE X 2)
Culture: NO GROWTH
Culture: NO GROWTH
Special Requests: ADEQUATE

## 2024-11-11 MED ORDER — CHLORHEXIDINE GLUCONATE CLOTH 2 % EX PADS: 6.0000 | MEDICATED_PAD | Freq: Every day | CUTANEOUS | Status: DC

## 2024-11-11 NOTE — Progress Notes (Signed)
  Progress Note   Patient: [name] FMW:[id number] DOB: 1951/07/08 DOA: 11/06/2024     5 DOS: the patient was seen and examined on 11/11/2024        Brief hospital course: 73 y.o. F with ESRD on HD TThS, DM, HTN, obesity who was sent from clinic due to fever.  Found in the ER to have abdominal pain, diarrhea, fever and hypotension refractory to fluids.  Admitted to ICU on pressors.     Assessment and Plan: Acute colitis Admitted on pressors, started on antibiotics and fluids.  GI were consulted, deferred colonoscopy.  GI pathogen panel negative.  Fecal calprotectin pending.  CT angiogram showed no focal lesion, vascular surgery was consulted and recommended supportive care.  GI opined that this was presumably ischemic colitis, microvascular.    Actually doing well in the last 24 hours, no bowel movement since yesterday morning. -Follow-up fecal calprotectin -Continue Zosyn  day 6, plan for 7-day course   End-stage renal disease - Dialysis schedule per nephrology - MBD therapy per Nephrology   Diabetes Glucose controlled - Hold home glimepiride  empagliflozin - Continue sliding scale corrections   Chronic hypotension Takes midodrine  on dialysis days.  Blood pressure mostly 150s in the last 24 hours - Would resume midodrine  with dialysis whenever that is   Class II obesity BMI 35.3, complicates care   Anxiety Chronic pain syndrome - Continue fluoxetine  and bupropion  - Continue home lorazepam  and oxycodone    Normocytic anemia Stable, due to chronic kidney disease   Hyponatremia Mild, asymptomatic             Subjective: Patient is feeling well, no bowel movements last 24 hours, no fever, no confusion.  Her abdominal pain is actually better today.     Physical Exam: BP (!) 155/83   Pulse 81   Temp 98.3 F (36.8 C) (Oral)   Resp 18   Ht 5' 1 (1.549 m)   Wt 84.8 kg   SpO2 100%   BMI 35.32 kg/m   Adult female, sitting up in chair, interactive  and appropriate RRR, no murmurs, no peripheral edema Respiratory rate normal, lungs clear without rales or wheezes Abdomen exam limited, but no tenderness or guarding to palpation in all quadrants Attention normal, affect pleasant, judgment and insight appear normal, face symmetric, speech fluent, moves all 4 extremities with normal strength and coordination    Data Reviewed: Basic metabolic panel shows persistent hyponatremia, elevated creatinine and BUN consistent with end-stage renal disease CBC shows mild anemia, essentially stable, no leukocytosis GI panel negative Discussed with nephrology    Family Communication:     Disposition: Status is: Inpatient         Author: Lonni SHAUNNA Dalton, MD 11/11/2024 8:48 AM  For on call review [email or web address].

## 2024-11-11 NOTE — Progress Notes (Signed)
 [facility] Progress Note   Subjective:   Reports she is feeling well, up walking around in room and denies any abdominal pain. Denies SOB, CP, dizziness, nausea. Reports trace edema. Asks if she can go home.   Objective Vitals:   11/10/24 1651 11/10/24 2014 11/11/24 0545 11/11/24 0813  BP: (!) 169/80 (!) 155/68 (S) (!) 149/71 (!) 155/83  Pulse: 74 88 75 81  Resp: 18 18 18 18   Temp: 98.1 F (36.7 C) 98.1 F (36.7 C) 98.3 F (36.8 C)   TempSrc:  Oral Oral   SpO2: 98%  97% 100%  Weight:      Height:       Physical Exam General: alert female in NAD Heart: RRR, no murmurs, rubs or gallops Lungs: CTA bilaterally, respirations unlabored on RA Abdomen: Soft, non-distended, +BS Extremities: trace non-pitting edema L ankle, no RLE edema Dialysis Access:  AVF + bruising, + bruit  Additional Objective Labs: Basic Metabolic Panel: Recent Labs  Lab 11/09/24 0146 11/10/24 0449 11/11/24 0342  NA 128* 131* 129*  K 4.8 4.0 3.7  CL 88* 92* 91*  CO2 16* 22 20*  GLUCOSE 56* 66* 107*  BUN 68* 35* 43*  CREATININE 6.01* 4.53* 5.86*  CALCIUM 8.1* 8.5* 8.6*   Liver Function Tests: Recent Labs  Lab 11/06/24 1559  AST 23  ALT 20  ALKPHOS 102  BILITOT 0.9  PROT 6.8  ALBUMIN  3.3*   No results for input(s): LIPASE, AMYLASE in the last 168 hours. CBC: Recent Labs  Lab 11/06/24 1559 11/07/24 0502 11/08/24 0445 11/09/24 0146 11/10/24 0449 11/11/24 0342  WBC 34.9* 27.4* 18.3* 10.7* 7.8 7.4  NEUTROABS 32.5*  --   --   --   --   --   HGB 11.1* 9.1* 9.5* 9.7* 9.2* 8.7*  HCT 34.6* 28.9* 29.4* 30.8* 28.5* 27.5*  MCV 97.5 98.6 96.1 99.0 96.0 97.9  PLT 216 157 186 162 181 205   Blood Culture    Component Value Date/Time   SDES BLOOD SITE NOT SPECIFIED 11/06/2024 1608   SPECREQUEST  11/06/2024 1608    BOTTLES DRAWN AEROBIC AND ANAEROBIC Blood Culture results may not be optimal due to an inadequate volume of blood received in culture bottles   CULT  11/06/2024  1608    NO GROWTH 5 DAYS Performed at [facility], [address], [place], [place] [address]    REPTSTATUS 11/11/2024 FINAL 11/06/2024 1608    Cardiac Enzymes: No results for input(s): CKTOTAL, CKMB, CKMBINDEX, TROPONINI in the last 168 hours. CBG: Recent Labs  Lab 11/09/24 0434 11/09/24 0452 11/09/24 0609 11/09/24 0707 11/09/24 1117  GLUCAP 49* 76 97 86 89   Iron Studies: No results for input(s): IRON, TIBC, TRANSFERRIN, FERRITIN in the last 72 hours. @lablastinr3 @ Studies/Results: No results found. Medications:  albumin  human     albumin  human     anticoagulant sodium citrate     piperacillin -tazobactam (ZOSYN )  IV 2.25 g (11/11/24 0620)    buPROPion   300 mg Oral q AM   Chlorhexidine  Gluconate Cloth  6 each Topical Daily   FLUoxetine   40 mg Oral q AM   heparin  injection (subcutaneous)  5,000 Units Subcutaneous Q8H    Dialysis Orders: Center: [facility]  on TTS . 180NRe 4 hours BFR 400 DFR Auto 1.5 EDW 82.8kg 2K 2C AVF 15g , no heparin  Mircera 60mcg IV q 2 weeks- last dose 10/18/24 Sensipar  30mg  PO q HD Calcitriol  2.75mcg PO q HD  Assessment/Plan:  Colitis:  On empiric antibiotics per admitting team  ESRD:  Typically on TTS schedule but had HD Saturday, Sunday, Monday, Friday this week due to access infiltration, holiday and hospital admission. No acute indication for HD today and she has had 3.5 HD sessions this week. If she discharges today, she can return to outpatient HD on Tuesday. If she remains admitted, we can plan for a short HD tomorrow to help with volume status.   Hypertension/volume: Initially hypotensive requiring levophed , now out of ICU and intermittently hypertensive. BP and diarrhea improved, 2kg over EDW by last weights.   Anemia: Hgb 8.7. Due for ESA, will order with next HD  Metabolic bone disease: Calcium controlled. Hold binders and sensipar  for now until tolerating PO  Nutrition:  advancing as tolerated per primary  team/GI  Lucie Collet, PA-C 11/11/2024, 9:59 AM  [facility] Pager: [phone]

## 2024-11-12 DIAGNOSIS — R579 Shock, unspecified: Secondary | ICD-10-CM | POA: Diagnosis not present

## 2024-11-12 NOTE — Patient Instructions (Signed)
Please go directly to the emergency department for further evaluation.

## 2024-11-12 NOTE — Progress Notes (Signed)
 [facility] Progress Note   Subjective:   Seen in [facility], on dialysis. Feeling better today, was able to eat yesterday. Breathing comfortable on room air. Denies cp, sob.   Objective Vitals:   11/12/24 1000 11/12/24 1030 11/12/24 1100 11/12/24 1130  BP: (!) 143/61 (!) 130/59 139/66 128/63  Pulse: 67 63 63 67  Resp: 16 15 17 17   Temp:      TempSrc:      SpO2: 96% 98% 100% 100%  Weight:      Height:       Physical Exam General: alert female in NAD Heart: RRR, no murmurs, rubs or gallops Lungs: CTA bilaterally, respirations unlabored on RA Abdomen: Soft, non-distended, +BS Extremities: trace non-pitting edema L ankle, no RLE edema Dialysis Access:  AVF + bruising, + bruit  Additional Objective Labs: Basic Metabolic Panel: Recent Labs  Lab 11/09/24 0146 11/10/24 0449 11/11/24 0342  NA 128* 131* 129*  K 4.8 4.0 3.7  CL 88* 92* 91*  CO2 16* 22 20*  GLUCOSE 56* 66* 107*  BUN 68* 35* 43*  CREATININE 6.01* 4.53* 5.86*  CALCIUM 8.1* 8.5* 8.6*   Liver Function Tests: Recent Labs  Lab 11/06/24 1559  AST 23  ALT 20  ALKPHOS 102  BILITOT 0.9  PROT 6.8  ALBUMIN  3.3*   No results for input(s): LIPASE, AMYLASE in the last 168 hours. CBC: Recent Labs  Lab 11/06/24 1559 11/07/24 0502 11/08/24 0445 11/09/24 0146 11/10/24 0449 11/11/24 0342  WBC 34.9* 27.4* 18.3* 10.7* 7.8 7.4  NEUTROABS 32.5*  --   --   --   --   --   HGB 11.1* 9.1* 9.5* 9.7* 9.2* 8.7*  HCT 34.6* 28.9* 29.4* 30.8* 28.5* 27.5*  MCV 97.5 98.6 96.1 99.0 96.0 97.9  PLT 216 157 186 162 181 205   Blood Culture    Component Value Date/Time   SDES BLOOD SITE NOT SPECIFIED 11/06/2024 1608   SPECREQUEST  11/06/2024 1608    BOTTLES DRAWN AEROBIC AND ANAEROBIC Blood Culture results may not be optimal due to an inadequate volume of blood received in culture bottles   CULT  11/06/2024 1608    NO GROWTH 5 DAYS Performed at [facility], [address] [place], [place] [address]     REPTSTATUS 11/11/2024 FINAL 11/06/2024 1608    Cardiac Enzymes: No results for input(s): CKTOTAL, CKMB, CKMBINDEX, TROPONINI in the last 168 hours. CBG: Recent Labs  Lab 11/09/24 0434 11/09/24 0452 11/09/24 0609 11/09/24 0707 11/09/24 1117  GLUCAP 49* 76 97 86 89   Iron Studies: No results for input(s): IRON, TIBC, TRANSFERRIN, FERRITIN in the last 72 hours. @lablastinr3 @ Studies/Results: No results found. Medications:  albumin  human     albumin  human     anticoagulant sodium citrate     piperacillin -tazobactam (ZOSYN )  IV 2.25 g (11/12/24 0530)    buPROPion   300 mg Oral q AM   FLUoxetine   40 mg Oral q AM   heparin  injection (subcutaneous)  5,000 Units Subcutaneous Q8H    Dialysis Orders: Center: [facility]  on TTS . 180NRe 4 hours BFR 400 DFR Auto 1.5 EDW 82.8kg 2K 2C AVF 15g , no heparin  Mircera 60mcg IV q 2 weeks- last dose 10/18/24 Sensipar  30mg  PO q HD Calcitriol  2.75mcg PO q HD  Assessment/Plan:  Colitis: On empiric antibiotics per admitting team  ESRD:  Typically on TTS schedule but had HD Saturday, Sunday, Monday, Friday this week due to access infiltration, holiday and hospital admission.  Short HD today for excess volume. Resume regular TTS schedule as outpatient   Hypertension/volume: Initially hypotensive requiring levophed , now out of ICU and intermittently hypertensive. BP and diarrhea improved. Weights up today. Continue to Push UF as outpatient.   Anemia: Hgb 8.7. Due for ESA, will resume as outpatient   Metabolic bone disease: Calcium controlled. Hold binders and sensipar  for now until tolerating PO  Nutrition:  tolerating PO now  Dispo. Ok for for discharge from renal standpoint   Anthonymichael Munday Ronnald Acosta PA-C [facility] 11/12/2024,11:40 AM

## 2024-11-12 NOTE — Discharge Summary (Signed)
 Physician Discharge Summary   Patient: [name] MRN: [id number] DOB: 1951-08-08  Admit date:     11/06/2024  Discharge date: 11/12/24  Discharge Physician: Lonni SHAUNNA Dalton   PCP: Garald Karlynn GAILS, MD     Recommendations at discharge:  Follow up with PCP Dr. Garald in 1 week for right-sided ischemic colitis Follow up with GI Dr. Abran as directed Dr. Abran: Follow fecal calprotectin      Discharge Diagnoses: Principal Problem:   Septic shock due to right sided colitis Active Problems:   Acute respiratory failure with hypoxia   Chronic hypotension       Right-sided ischemic colitis   End-stage renal disease   Type 2 diabetes   Class III obesity   Anemia of chronic kidney disease   Hyperlipidemia   Mood disorder   Chronic pain       Hospital Course: 73 y.o. F with ESRD on HD TThS, DM, HTN, obesity who was sent from clinic due to fever.  Found in the ER to have abdominal pain, diarrhea, fever and hypotension refractory to fluids.  Admitted to ICU on pressors.    Acute colitis Admitted to ICU on pressors, started on antibiotics and fluids.  GI were consulted, deferred colonoscopy.  GI pathogen panel negative.      Vascular surgery consulted, but CT angiogram showed no focal lesion, no intervention possible.  GI opined that this was presumably ischemic colitis again, microvascular, which she has had in the past.     Completed 7 days Zosyn .  Tolerating diet.  Bowel movements still loose but only 2-3 per day.                The [place]  Controlled Substances Registry was reviewed for this patient prior to discharge.   Consultants: [facility], Dr. Critical care    Disposition: Home health Diet recommendation:  Discharge Diet Orders (From admission, onward)     Start     Ordered   11/12/24 0000  Diet - low sodium heart healthy        11/12/24 1402             DISCHARGE MEDICATION: Allergies as of 11/12/2024        Reactions   Erythromycin  Diarrhea, Nausea And Vomiting   Gabapentin Other (See Comments)   Confusion and falling  Hallucinations        Medication List     TAKE these medications    Accu-Chek Softclix Lancets lancets Use 1 lancet up to 4 times daily as directed   acetaminophen  500 MG tablet Commonly known as: TYLENOL  Take 1,000 mg by mouth 2 (two) times daily as needed for headache.   aspirin -acetaminophen -caffeine 250-250-65 MG tablet Commonly known as: EXCEDRIN MIGRAINE Take 1 tablet by mouth daily as needed for headache or migraine. Max 3 doses in one week   buPROPion  300 MG 24 hr tablet Commonly known as: Wellbutrin  XL Take 1 tablet (300 mg total) by mouth in the morning.   carboxymethylcellulose 0.5 % Soln Commonly known as: REFRESH PLUS Place 1 drop into both eyes 3 (three) times daily as needed (dry eyes/irritation).   cinacalcet  30 MG tablet Commonly known as: SENSIPAR  Take 30 mg by mouth Every Tuesday,Thursday,and Saturday with dialysis.   cyclobenzaprine  10 MG tablet Commonly known as: FLEXERIL  Take 1 tablet (10 mg total) by mouth 3 (three) times daily as needed for muscle spasms.   diphenhydramine -acetaminophen  25-500 MG Tabs tablet Commonly known as: TYLENOL  PM Take 2  tablets by mouth 3 (three) times a week. At bedtime   docusate sodium  50 MG capsule Commonly known as: COLACE Take 50 mg by mouth 2 (two) times daily.   FLUoxetine  40 MG capsule Commonly known as: PROZAC  Take 1 capsule (40 mg total) by mouth in the morning.   fluticasone  50 MCG/ACT nasal spray Commonly known as: FLONASE  Place 2 sprays into both nostrils daily.   glimepiride  1 MG tablet Commonly known as: AMARYL  Take 1 tablet (1 mg total) by mouth daily with breakfast.   ibuprofen 200 MG tablet Commonly known as: ADVIL Take 200 mg by mouth every 6 (six) hours as needed for fever, headache, mild pain (pain score 1-3) or moderate pain (pain score 4-6).   ketoconazole  2 %  cream Commonly known as: NIZORAL  Apply 1 Application topically daily. What changed:  when to take this reasons to take this   lanthanum 1000 MG chewable tablet Commonly known as: FOSRENOL Chew 1,000-2,000 mg by mouth See admin instructions. Taking 2000 mg with meals and 1000 mg  with snacks   LORazepam  2 MG tablet Commonly known as: ATIVAN  Take 1 tablet (2 mg total) by mouth 2 (two) times daily as needed for anxiety.   midodrine  10 MG tablet Commonly known as: PROAMATINE  Take 3 tablets (30 mg total) by mouth 3 (three) times a week. Take 30 minutes before dialysis and extra tablet during dialysis. What changed: how much to take   naloxone  4 MG/0.1ML Liqd nasal spray kit Commonly known as: Narcan  Take by nasal route every 3 minutes until patient awakes or EMS arrives.   ondansetron  4 MG tablet Commonly known as: Zofran  Take 1 tablet (4 mg total) by mouth every 8 (eight) hours as needed for nausea or vomiting.   oxyCODONE  15 MG immediate release tablet Commonly known as: ROXICODONE  Take 1 tablet (15 mg total) by mouth 4 (four) times daily as needed. What changed: reasons to take this   Ozempic  (0.25 or 0.5 MG/DOSE) 2 MG/3ML Sopn Generic drug: Semaglutide (0.25 or 0.5MG /DOS) INJECT SUBCUTANEOUSLY 0.5 MG  EVERY WEEK   pantoprazole  40 MG tablet Commonly known as: PROTONIX  TAKE 1 TABLET BY MOUTH TWICE  DAILY   pilocarpine  5 MG tablet Commonly known as: SALAGEN  TAKE 1 TABLET BY MOUTH TWICE  DAILY   pioglitazone  15 MG tablet Commonly known as: ACTOS  Take 1 tablet (15 mg total) by mouth daily.   polyethylene glycol 17 g packet Commonly known as: MIRALAX  / GLYCOLAX  Take 17 g by mouth daily as needed for moderate constipation.   PreserVision AREDS 2 Caps Take 1 capsule by mouth daily at 12 noon.   PreviDent 5000 Dry Mouth 1.1 % Gel dental gel Generic drug: sodium fluoride Place 1 application  onto teeth in the morning and at bedtime.   RENA-VITE PO Take 1 tablet by  mouth every morning.   Repatha  SureClick 140 MG/ML Soaj Generic drug: Evolocumab  INJECT 1 PEN SUBCUTANEOUSLY  EVERY 2 WEEKS   sodium chloride  0.65 % Soln nasal spray Commonly known as: OCEAN Place 1 spray into both nostrils as needed. What changed: reasons to take this        Follow-up Information     Plotnikov, Karlynn GAILS, MD. Schedule an appointment as soon as possible for a visit in 1 week(s).   Specialty: Internal Medicine Contact information: [address] [place] [address] [phone]         Abran Norleen SAILOR, MD Follow up.   Specialty: Gastroenterology Contact information: [address] [place] [address] [phone]                 Discharge Instructions     Diet - low sodium heart healthy   Complete by: As directed    Discharge instructions   Complete by: As directed    **IMPORTANT DISCHARGE INSTRUCTIONS**   From Dr. Jonel:   You were admitted for acute ischemic colitis.  Here, you were treated with antibiotics and completed the course  Resume all of your home medicines without changes  Go see Dr. Garald in 1 week  Call Dr. Nancyann office to see when they want to schedule you for follow up   Increase activity slowly   Complete by: As directed        Discharge Exam: Filed Weights   11/12/24 0700 11/12/24 0904 11/12/24 1200  Weight: 90 kg 89 kg 86.6 kg    General: Pt is alert, awake, not in acute distress Cardiovascular: RRR, nl S1-S2, no murmurs appreciated.   No LE edema.   Respiratory: Normal respiratory rate and rhythm.  CTAB without rales or wheezes. Abdominal: Abdomen soft and non-tender.  No distension or HSM.   Neuro/Psych: Strength symmetric in upper and lower extremities.  Judgment and insight appear normal.   Condition at discharge: good  The results of significant diagnostics from this hospitalization (including imaging, microbiology, ancillary and laboratory) are listed below for reference.    Imaging Studies: CT ANGIO ABDOMEN W &/OR WO CONTRAST Result Date: 11/08/2024 CLINICAL DATA:  Aortic atherosclerosis. EXAM: CT ANGIOGRAPHY ABDOMEN TECHNIQUE: Multidetector CT imaging of the abdomen was performed using the standard protocol during bolus administration of intravenous contrast. Multiplanar reconstructed images and MIPs were obtained and reviewed to evaluate the vascular anatomy. RADIATION DOSE REDUCTION: This exam was performed according to the departmental dose-optimization program which includes automated exposure control, adjustment of the mA and/or kV according to patient size and/or use of iterative reconstruction technique. CONTRAST:  75mL OMNIPAQUE  IOHEXOL  350 MG/ML SOLN COMPARISON:  CT, 11/06/2024, performed for abdominal pain. FINDINGS: VASCULAR Aorta: Aorta normal in caliber. Mild aortic atherosclerosis. No dissection or stenosis. Celiac: Patent without evidence of aneurysm, dissection, vasculitis or significant stenosis. SMA: Patent without evidence of aneurysm, dissection, vasculitis or significant stenosis. Renals: Small dual right renal arteries. Mild calcified plaque at the origin of the larger of the 2, without significant stenosis. Single left renal artery without significant stenosis. IMA: Widely patent inferior mesenteric artery. Inflow: Widely patent common iliac arteries. Veins: No obvious venous abnormality within the limitations of this arterial phase study. Review of the MIP images confirms the above findings. NON-VASCULAR Lower chest: Trace pleural effusions.  Mild dependent atelectasis. Hepatobiliary: Liver is unremarkable. Gallbladder is distended. No wall thickening or adjacent inflammation. No stones. No bile duct dilation. Pancreas: Atrophy, otherwise unremarkable. No pancreatic ductal dilatation or surrounding inflammatory changes. Spleen: Normal in size without focal abnormality. Adrenals/Urinary Tract: No adrenal mass. Bilateral renal atrophy. 3 cm left midpole  renal cyst. No stones. No hydronephrosis. Visualized ureters are unremarkable. Stomach/Bowel: Concentric wall thickening of the ascending colon with mild adjacent inflammatory haziness as well as a prominent mesenteric lymph nodes. These findings are stable from the recent prior exam. No bowel dilation to suggest obstruction. No other wall thickening or inflammatory changes. Lymphatic: Prominent lymph nodes adjacent to the affected ascending colon. No other evidence of adenopathy. Other: Partly imaged midline fat containing hernia, stable from the previous exam. Musculoskeletal: No fracture or acute finding.  No bone lesion. IMPRESSION:  VASCULAR 1. Mild aortic atherosclerosis. No aneurysm. No dissection. No hemodynamically significant stenosis of the aorta or evidence branch vessels. NON-VASCULAR 1. Bilateral renal atrophy with associated small renal arteries. 2. Right colon circumferential wall thickening with associated inflammation and prominent adjacent mesenteric lymph nodes. This is unchanged compared to the exam from 2 days ago. Differential diagnosis again includes neoplastic disease and infection/inflammation. Electronically Signed   By: Alm Parkins M.D.   On: 11/08/2024 15:52   CT ABDOMEN PELVIS WO CONTRAST Result Date: 11/06/2024 EXAM: CT ABDOMEN AND PELVIS WITHOUT CONTRAST 11/06/2024 05:40:33 PM TECHNIQUE: CT of the abdomen and pelvis was performed without the administration of intravenous contrast. Multiplanar reformatted images are provided for review. Automated exposure control, iterative reconstruction, and/or weight-based adjustment of the mA/kV was utilized to reduce the radiation dose to as low as reasonably achievable. COMPARISON: Chest x-ray 11/06/2024, CT chest abdomen pelvis 03/10/2022. CLINICAL HISTORY: Abdominal pain, acute, nonlocalized. FINDINGS: LOWER CHEST: Coronary vascular calcifications. LIVER: The liver is unremarkable. GALLBLADDER AND BILE DUCTS: Distended gallbladder without  calcified stone or right upper quadrant inflammation. No biliary ductal dilatation. SPLEEN: No acute abnormality. PANCREAS: Atrophic pancreas without inflammation. ADRENAL GLANDS: No acute abnormality. KIDNEYS, URETERS AND BLADDER: Atrophic kidneys without hydronephrosis. Simple and complex cysts within the bilateral kidneys. Per consensus, no follow-up is needed for simple Bosniak type 1 and 2 renal cysts, unless the patient has a malignancy history or risk factors. No stones in the kidneys or ureters. No perinephric or periureteral stranding. Decompressed urinary bladder. GI AND BOWEL: Stomach demonstrates no acute abnormality. Dense material within the colon. Diverticular disease of the left colon. Circumferential wall thickening of the ascending colon with pericolonic stranding, series 3 image 44. Moderate to large colonic stool burden. No extraluminal gas to suggest a perforation. There is no bowel obstruction. PERITONEUM AND RETROPERITONEUM: No ascites. No free air. VASCULATURE: Vascular structures are limited without intravenous contrast. Aorta is normal in caliber. LYMPH NODES: Few small right lower quadrant mesenteric nodes measuring up to 9 mm. REPRODUCTIVE ORGANS: No acute abnormality. BONES AND SOFT TISSUES: Scoliosis and degenerative changes of the spine. No acute osseous abnormality. Moderate fat-containing umbilical hernia. No focal soft tissue abnormality. IMPRESSION: 1. Circumferential wall thickening of the ascending colon with pericolonic stranding, no evidence for perforation. Findings could be secondary to focal colitis of infectious, inflammatory, or ischemic etiology; however, inflammatory colonic neoplasm is also a concern. Recommend correlation with colonoscopy. 2. Diverticular disease of the left colon without evidence of diverticulitis. 3. Markedly distended gallbladder without calcified stone or inflammation 4. Atrophic kidneys. Simple and complex cysts within both kidneys, nonemergent  follow-up renal CT or MRI could be considered. Electronically signed by: Luke Bun MD 11/06/2024 06:09 PM EST RP Workstation: [id number]   DG Chest Port 1 View Result Date: 11/06/2024 EXAM: 1 VIEW(S) XRAY OF THE CHEST 11/06/2024 04:15:00 PM COMPARISON: 06/07/2023 CLINICAL HISTORY: Questionable sepsis - evaluate for abnormality FINDINGS: LUNGS AND PLEURA: Elevated right hemidiaphragm. No focal pulmonary opacity. No pleural effusion. No pneumothorax. HEART AND MEDIASTINUM: No acute abnormality of the cardiac and mediastinal silhouettes. BONES AND SOFT TISSUES: Right reverse shoulder arthroplasty noted. Asymmetric spurring of the right acromioclavicular joint. Thoracic spondylosis. Dextroconvex thoracic scoliosis. No acute osseous abnormality. IMPRESSION: 1. No acute cardiopulmonary abnormality. 2. Right reverse shoulder arthroplasty. 3. Elevated right hemidiaphragm. 4. Asymmetric degenerative changes of the right acromioclavicular joint. 5. Thoracic spondylosis. 6. Dextroconvex thoracic scoliosis. Electronically signed by: Ryan Salvage MD 11/06/2024 04:42 PM EST RP Workstation: [id number]    Microbiology: Results  for orders placed or performed during the hospital encounter of 11/06/24  Blood Culture (routine x 2)     Status: None   Collection Time: 11/06/24  3:59 PM   Specimen: BLOOD  Result Value Ref Range Status   Specimen Description BLOOD SITE NOT SPECIFIED  Final   Special Requests   Final    BOTTLES DRAWN AEROBIC AND ANAEROBIC Blood Culture adequate volume   Culture   Final    NO GROWTH 5 DAYS Performed at [facility], [address] [place], [place] [address]    Report Status 11/11/2024 FINAL  Final  Resp panel by RT-PCR (RSV, Flu A&B, Covid) Anterior Nasal Swab     Status: None   Collection Time: 11/06/24  4:08 PM   Specimen: Anterior Nasal Swab  Result Value Ref Range Status   SARS Coronavirus 2 by RT PCR NEGATIVE NEGATIVE Final   Influenza A by PCR NEGATIVE NEGATIVE  Final   Influenza B by PCR NEGATIVE NEGATIVE Final    Comment: (NOTE) The Xpert Xpress SARS-CoV-2/FLU/RSV plus assay is intended as an aid in the diagnosis of influenza from Nasopharyngeal swab specimens and should not be used as a sole basis for treatment. Nasal washings and aspirates are unacceptable for Xpert Xpress SARS-CoV-2/FLU/RSV testing.  Fact Sheet for Patients: bloggercourse.com  Fact Sheet for Healthcare Providers: seriousbroker.it  This test is not yet approved or cleared by the [place]  FDA and has been authorized for detection and/or diagnosis of SARS-CoV-2 by FDA under an Emergency Use Authorization (EUA). This EUA will remain in effect (meaning this test can be used) for the duration of the COVID-19 declaration under Section 564(b)(1) of the Act, 21 U.S.C. section 360bbb-3(b)(1), unless the authorization is terminated or revoked.     Resp Syncytial Virus by PCR NEGATIVE NEGATIVE Final    Comment: (NOTE) Fact Sheet for Patients: bloggercourse.com  Fact Sheet for Healthcare Providers: seriousbroker.it  This test is not yet approved or cleared by the [place]  FDA and has been authorized for detection and/or diagnosis of SARS-CoV-2 by FDA under an Emergency Use Authorization (EUA). This EUA will remain in effect (meaning this test can be used) for the duration of the COVID-19 declaration under Section 564(b)(1) of the Act, 21 U.S.C. section 360bbb-3(b)(1), unless the authorization is terminated or revoked.  Performed at [facility], [address] [place], [place] [address]   Blood Culture (routine x 2)     Status: None   Collection Time: 11/06/24  4:08 PM   Specimen: BLOOD  Result Value Ref Range Status   Specimen Description BLOOD SITE NOT SPECIFIED  Final   Special Requests   Final    BOTTLES DRAWN AEROBIC AND ANAEROBIC Blood Culture  results may not be optimal due to an inadequate volume of blood received in culture bottles   Culture   Final    NO GROWTH 5 DAYS Performed at [facility], [address] [place], [place] [address]    Report Status 11/11/2024 FINAL  Final  MRSA Next Gen by PCR, Nasal     Status: None   Collection Time: 11/06/24  7:06 PM   Specimen: Nasal Mucosa; Nasal Swab  Result Value Ref Range Status   MRSA by PCR Next Gen NOT DETECTED NOT DETECTED Final    Comment: (NOTE) The GeneXpert MRSA Assay (FDA approved for NASAL specimens only), is one component of a comprehensive MRSA colonization surveillance program. It is not intended to diagnose MRSA  infection nor to guide or monitor treatment for MRSA infections. Test performance is not FDA approved in patients less than 91 years old. Performed at [facility], [address], [place], [place] [address]   Gastrointestinal Panel by PCR , Stool     Status: None   Collection Time: 11/09/24 10:03 AM   Specimen: Stool  Result Value Ref Range Status   Campylobacter species NOT DETECTED NOT DETECTED Final   Plesimonas shigelloides NOT DETECTED NOT DETECTED Final   Salmonella species NOT DETECTED NOT DETECTED Final   Yersinia enterocolitica NOT DETECTED NOT DETECTED Final   Vibrio species NOT DETECTED NOT DETECTED Final   Vibrio cholerae NOT DETECTED NOT DETECTED Final   Enteroaggregative E coli (EAEC) NOT DETECTED NOT DETECTED Final   Enteropathogenic E coli (EPEC) NOT DETECTED NOT DETECTED Final   Enterotoxigenic E coli (ETEC) NOT DETECTED NOT DETECTED Final   Shiga like toxin producing E coli (STEC) NOT DETECTED NOT DETECTED Final   Shigella/Enteroinvasive E coli (EIEC) NOT DETECTED NOT DETECTED Final   Cryptosporidium NOT DETECTED NOT DETECTED Final   Cyclospora cayetanensis NOT DETECTED NOT DETECTED Final   Entamoeba histolytica NOT DETECTED NOT DETECTED Final   Giardia lamblia NOT DETECTED NOT DETECTED Final   Adenovirus F40/41 NOT  DETECTED NOT DETECTED Final   Astrovirus NOT DETECTED NOT DETECTED Final   Norovirus GI/GII NOT DETECTED NOT DETECTED Final   Rotavirus A NOT DETECTED NOT DETECTED Final   Sapovirus (I, II, IV, and V) NOT DETECTED NOT DETECTED Final    Comment: Performed at [facility], [address] [place], [place] [address]   *Note: Due to a large number of results and/or encounters for the requested time period, some results have not been displayed. A complete set of results can be found in Results Review.    Labs: CBC: Recent Labs  Lab 11/06/24 1559 11/07/24 0502 11/08/24 0445 11/09/24 0146 11/10/24 0449 11/11/24 0342  WBC 34.9* 27.4* 18.3* 10.7* 7.8 7.4  NEUTROABS 32.5*  --   --   --   --   --   HGB 11.1* 9.1* 9.5* 9.7* 9.2* 8.7*  HCT 34.6* 28.9* 29.4* 30.8* 28.5* 27.5*  MCV 97.5 98.6 96.1 99.0 96.0 97.9  PLT 216 157 186 162 181 205   Basic Metabolic Panel: Recent Labs  Lab 11/07/24 0502 11/08/24 0445 11/09/24 0146 11/10/24 0449 11/11/24 0342  NA 132* 130* 128* 131* 129*  K 4.0 4.6 4.8 4.0 3.7  CL 92* 92* 88* 92* 91*  CO2 22 22 16* 22 20*  GLUCOSE 81 102* 56* 66* 107*  BUN 46* 57* 68* 35* 43*  CREATININE 4.35* 5.38* 6.01* 4.53* 5.86*  CALCIUM 8.5* 8.3* 8.1* 8.5* 8.6*   Liver Function Tests: Recent Labs  Lab 11/06/24 1559  AST 23  ALT 20  ALKPHOS 102  BILITOT 0.9  PROT 6.8  ALBUMIN  3.3*   CBG: Recent Labs  Lab 11/09/24 0434 11/09/24 0452 11/09/24 0609 11/09/24 0707 11/09/24 1117  GLUCAP 49* 76 97 86 89    Discharge time spent: approximately 45 minutes spent on discharge counseling, evaluation of patient on day of discharge, and coordination of discharge planning with nursing, social work, pharmacy and case management  Signed: Lonni SHAUNNA Dalton, MD [facility] 11/12/2024

## 2024-11-12 NOTE — Plan of Care (Signed)
 [facility] Patient Discharge Orders- [facility] CLINIC: [facility]  Patient's name: [name] Admit/DC Dates: 11/06/2024 - 11/12/2024  Discharge Diagnoses: Acute colitis. No focal lesions. S/p IV antibiotics  Outpatient Dialysis Orders:  -Heparin : No change  -EDW No change.  -B[place]: No change   Anemia Aranesp : Given: --   Date of last dose/amount: --   PRBC's Given: -- Date/# of units: -- ESA dose for discharge: Mircera 75 mcg IV q 2 weeks (give 12/2)  Recent Labs  Lab 11/06/24 1559 11/07/24 0502 11/11/24 0342  HGB 11.1*   < > 8.7*  K 4.4   < > 3.7  CALCIUM 9.3   < > 8.6*  ALBUMIN  3.3*  --   --    < > = values in this interval not displayed.   Access intervention/Change:  none  Medications: -IV Antibiotics: none -Anticoagulation: none   OTHER/APPTS/LABS   CODE STATUS  DNR    Completed by: Maisie Ronnald Acosta PA-C   D/C Meds to be reconciled by nurse after every discharge.    Reviewed by: MD:______ RN_______

## 2024-11-12 NOTE — Progress Notes (Signed)
 D/C order noted. Pt receives out-pt HD at [facility] on TTS 5:40 am chair time. Contacted [facility] to be advised of pt's d/c today and that pt should resume care tomorrow.   Randine Mungo Dialysis Navigator [phone]

## 2024-11-12 NOTE — Procedures (Signed)
 HD Note:  Some information was entered later than the data was gathered due to patient care needs. The stated time with the data is accurate.  Received patient in bed to unit.   Alert and oriented.   Informed consent signed and in chart.   Access used: right upper arm fistula Access issues: None  Patient tolerated treatment well.   TX duration: 2.5 hours  Alert, without acute distress.  Total UF removed: 2000 ml  Hand-off given to patient's nurse.   Transported back to the room   Avondre Richens L. Lenon, RN [facility].

## 2024-11-13 ENCOUNTER — Encounter: Payer: Self-pay | Admitting: Neurology

## 2024-11-13 ENCOUNTER — Telehealth: Payer: Self-pay

## 2024-11-13 LAB — CALPROTECTIN, FECAL: Calprotectin, Fecal: 842 ug/g — ABNORMAL HIGH (ref 0–120)

## 2024-11-13 NOTE — Patient Instructions (Signed)
 Visit Information  Thank you for taking time to visit with me today. Please don't hesitate to contact me if I can be of assistance to you before our next scheduled telephone appointment.  Our next appointment is by telephone on 12/06/24 in the afternoon   Following is a copy of your care plan:   Goals Addressed             This Visit's Progress    VBCI Transitions of Care (TOC) Care Plan       Problems:  Recent Hospitalization for treatment of Septic shock due to right sided colitis  Goal:  Over the next 30 days, the patient will not experience hospital readmission  Interventions:  Transitions of Care: Doctor Visits  - discussed the importance of doctor visits Reviewed Signs and symptoms of infection Reviewed Dialysis Days - Tu, Th, Sat - patient states no urine output    Patient Self Care Activities:  Attend all scheduled provider appointments Call pharmacy for medication refills 3-7 days in advance of running out of medications Call provider office for new concerns or questions  Notify RN Care Manager of TOC call rescheduling needs Participate in Transition of Care Program/Attend TOC scheduled calls Take medications as prescribed   Call GI and schedule follow up appt with Dr Norleen Kiang  Notify provider with return of diarrhea or abdominal pain, or fever  Notify provider with any falls Keep hospital follow up appt with PCP 11/16/24  Plan:  Telephone follow up appointment with care management team member scheduled for:  11/21/24 in the afternoon  The patient has been provided with contact information for the care management team and has been advised to call with any health related questions or concerns.         Patient verbalizes understanding of instructions and care plan provided today and agrees to view in MyChart. Active MyChart status and patient understanding of how to access instructions and care plan via MyChart confirmed with patient.     Telephone follow up  appointment with care management team member scheduled for: 11/21/24 The patient has been provided with contact information for the care management team and has been advised to call with any health related questions or concerns.   Please call the care guide team at [phone] if you need to cancel or reschedule your appointment.   Please call the Suicide and Crisis Lifeline: 988 call 1-800-273-TALK (toll free, 24 hour hotline) call 911 if you are experiencing a Mental Health or Behavioral Health Crisis or need someone to talk to.  Shona Prow RN, CCM [place]  VBCI-Population Health RN Care Manager [phone]

## 2024-11-13 NOTE — Transitions of Care (Post Inpatient/ED Visit) (Signed)
 11/13/2024  Name: [name] MRN: [id number] DOB: 1951/02/23  Today's TOC FU Call Status: Today's TOC FU Call Status:: Successful TOC FU Call Completed TOC FU Call Complete Date: 11/13/24  Patient's Name and Date of Birth confirmed. DOB, Name  Transition Care Management Follow-up Telephone Call Date of Discharge: 11/12/24 Discharge Facility: [name]) Type of Discharge: Inpatient Admission Primary Inpatient Discharge Diagnosis:: Septic shock due to right sided colitis How have you been since you were released from the hospital?: Better Any questions or concerns?: No  Items Reviewed: Did you receive and understand the discharge instructions provided?: Yes Medications obtained,verified, and reconciled?: Yes (Medications Reviewed) Any new allergies since your discharge?: No Dietary orders reviewed?: Yes Type of Diet Ordered:: low sodium heart healthy, renal Do you have support at home?: Yes People in Home [RPT]: alone Name of Support/Comfort Primary Source: Lives alone in condo with elevator on 3rd floor - has friends and sister who can help  Medications Reviewed Today: Medications Reviewed Today     Reviewed by Lauro Shona LABOR, RN (Registered Nurse) on 11/13/24 at 1405  Med List Status: <None>   Medication Order Taking? Sig Documenting Provider Last Dose Status Informant  Accu-Chek Softclix Lancets lancets [id number] Yes Use 1 lancet up to 4 times daily as directed Samtani, Jai-Gurmukh, MD  Active Self, Pharmacy Records  acetaminophen  (TYLENOL ) 500 MG tablet [id number] Yes Take 1,000 mg by mouth 2 (two) times daily as needed for headache. [provider]  Active Self, Pharmacy Records  aspirin -acetaminophen -caffeine (EXCEDRIN MIGRAINE) 250-250-65 MG tablet [id number]  Take 1 tablet by mouth daily as needed for headache or migraine. Max 3 doses in one week  Patient not taking: Reported on 11/13/2024   [provider]  Active Self, Pharmacy Records  B  Complex-C-Folic Acid  (RENA-VITE PO) [id number] Yes Take 1 tablet by mouth every morning. [provider]  Active Self, Pharmacy Records  buPROPion  (WELLBUTRIN  XL) 300 MG 24 hr tablet [id number] Yes Take 1 tablet (300 mg total) by mouth in the morning. Plotnikov, Aleksei V, MD  Active Self, Pharmacy Records  carboxymethylcellulose (REFRESH PLUS) 0.5 % SOLN [id number] Yes Place 1 drop into both eyes 3 (three) times daily as needed (dry eyes/irritation). [provider]  Active Self, Pharmacy Records  cinacalcet  (SENSIPAR ) 30 MG tablet [id number] Yes Take 30 mg by mouth Every Tuesday,Thursday,and Saturday with dialysis. [provider]  Active Self, Pharmacy Records  cyclobenzaprine  (FLEXERIL ) 10 MG tablet [id number] Yes Take 1 tablet (10 mg total) by mouth 3 (three) times daily as needed for muscle spasms. Plotnikov, Aleksei V, MD  Active Self, Pharmacy Records  diphenhydramine -acetaminophen  (TYLENOL  PM) 25-500 MG TABS tablet [id number] Yes Take 2 tablets by mouth 3 (three) times a week. At bedtime  Patient taking differently: Take 2 tablets by mouth 3 (three) times a week. At bedtime  - Takes as needed   [provider]  Active Self, Pharmacy Records  docusate sodium  (COLACE) 50 MG capsule [id number] Yes Take 50 mg by mouth 2 (two) times daily. [provider]  Active Self, Pharmacy Records  FLUoxetine  (PROZAC ) 40 MG capsule [id number] Yes Take 1 capsule (40 mg total) by mouth in the morning.   Active Self, Pharmacy Records  fluticasone  (FLONASE ) 50 MCG/ACT nasal spray [id number] Yes Place 2 sprays into both nostrils daily. Soldatova, Liuba, MD  Active Self, Pharmacy Records  glimepiride  (AMARYL ) 1 MG tablet [id number] Yes Take 1 tablet (1 mg total) by mouth daily with breakfast. Plotnikov, Aleksei V,  MD  Active Self, Pharmacy Records  ibuprofen (ADVIL) 200 MG tablet [id number] Yes Take 200 mg by mouth every 6 (six) hours as needed for fever, headache, mild pain (pain  score 1-3) or moderate pain (pain score 4-6). [provider]  Active Self, Pharmacy Records           Med Note STEFFI, ALEXANDRIA   Wed Nov 07, 2024 11:22 AM) Patient is on dialysis and is taking NSAID  ketoconazole  (NIZORAL ) 2 % cream [id number] Yes Apply 1 Application topically daily. Plotnikov, Aleksei V, MD  Active Self, Pharmacy Records  lanthanum [facility]) 1000 MG chewable tablet [id number] Yes Chew 1,000-2,000 mg by mouth See admin instructions. Taking 2000 mg with meals and 1000 mg  with snacks [provider]  Active Self, Pharmacy Records  LORazepam  (ATIVAN ) 2 MG tablet [id number] Yes Take 1 tablet (2 mg total) by mouth 2 (two) times daily as needed for anxiety.   Active Self, Pharmacy Records  midodrine  (PROAMATINE ) 10 MG tablet [id number] Yes Take 3 tablets (30 mg total) by mouth 3 (three) times a week. Take 30 minutes before dialysis and extra tablet during dialysis.  Patient taking differently: Take 10 mg by mouth 3 (three) times a week. Take 30 minutes before dialysis and extra tablet during dialysis.   Marlee Bernardino NOVAK, MD  Active Self, Pharmacy Records  Multiple Vitamins-Minerals (PRESERVISION AREDS 2) CAPS [id number] Yes Take 1 capsule by mouth daily at 12 noon. [provider]  Active Self, Pharmacy Records  naloxone  (NARCAN ) nasal spray 4 mg/0.1 mL [id number] Yes Take by nasal route every 3 minutes until patient awakes or EMS arrives.   Active Self, Pharmacy Records           Med Note STEFFI, ADELITA   Wed Nov 07, 2024 11:34 AM)    ondansetron  (ZOFRAN ) 4 MG tablet [id number] Yes Take 1 tablet (4 mg total) by mouth every 8 (eight) hours as needed for nausea or vomiting. Shuford, Randine, PA-C  Active Self, Pharmacy Records  oxyCODONE  (ROXICODONE ) 15 MG immediate release tablet [id number] Yes Take 1 tablet (15 mg total) by mouth 4 (four) times daily as needed.   Active Self, Pharmacy Records  pantoprazole  (PROTONIX ) 40 MG tablet [id number] Yes TAKE 1 TABLET BY  MOUTH TWICE  DAILY Abran Norleen SAILOR, MD  Active Self, Pharmacy Records  pilocarpine  (SALAGEN ) 5 MG tablet [id number] Yes TAKE 1 TABLET BY MOUTH TWICE  DAILY Plotnikov, Aleksei V, MD  Active Self, Pharmacy Records  pioglitazone  (ACTOS ) 15 MG tablet [id number] Yes Take 1 tablet (15 mg total) by mouth daily. Plotnikov, Aleksei V, MD  Active Self, Pharmacy Records  polyethylene glycol (MIRALAX  / GLYCOLAX ) 17 g packet [id number] Yes Take 17 g by mouth daily as needed for moderate constipation. [provider]  Active Self, Pharmacy Records           Med Note KERRIN, MELISSA R   Thu Feb 02, 2024  2:01 PM)    PREVIDENT 5000 DRY MOUTH 1.1 % GEL dental gel [id number] Yes Place 1 application  onto teeth in the morning and at bedtime. [provider]  Active Self, Pharmacy Records  REPATHA  SURECLICK 140 MG/ML EMMANUEL [id number] Yes INJECT 1 PEN SUBCUTANEOUSLY  EVERY 2 WEEKS Croitoru, Mihai, MD  Active Self, Pharmacy Records  Semaglutide ,0.25 or 0.5MG /DOS, (OZEMPIC , 0.25 OR 0.5 MG/DOSE,) 2 MG/3ML NELMA [id number] Yes INJECT SUBCUTANEOUSLY 0.5 MG  EVERY WEEK Plotnikov, Aleksei V, MD  Active Self, Pharmacy Records  sodium chloride  (OCEAN) 0.65 %  SOLN nasal spray [id number] Yes Place 1 spray into both nostrils as needed.  Patient taking differently: Place 1 spray into both nostrils as needed for congestion.   Okey Burns, MD  Active Self, Pharmacy Records  Med List Note Mylo Powell CROME, CPhT 09/14/21 1722): Dialysis Tuesday, Thursday, Saturday - [facility].            Home Care and Equipment/Supplies: Were Home Health Services Ordered?: No Any new equipment or medical supplies ordered?: No  Functional Questionnaire: Do you need assistance with bathing/showering or dressing?: No Do you need assistance with meal preparation?: No Do you need assistance with eating?: No Do you have difficulty maintaining continence: Yes (bowel incontinence due to occasional diarrhea) Do you need assistance with  getting out of bed/getting out of a chair/moving?: No Do you have difficulty managing or taking your medications?: No  Follow up appointments reviewed: PCP Follow-up appointment confirmed?: Yes Date of PCP follow-up appointment?: 11/16/24 Follow-up Provider: Corean Kill at PCP office scheduled by [facility] RN Specialist [facility] Follow-up appointment confirmed?: No (patient states she will call and schedule with GI) Do you need transportation to your follow-up appointment?: No Do you understand care options if your condition(s) worsen?: Yes-patient verbalized understanding  SDOH Interventions Today    Flowsheet Row Most Recent Value  SDOH Interventions   Food Insecurity Interventions Intervention Not Indicated  Housing Interventions Intervention Not Indicated  Transportation Interventions Intervention Not Indicated  Utilities Interventions Intervention Not Indicated    Goals Addressed             This Visit's Progress    VBCI Transitions of Care (TOC) Care Plan       Problems:  Recent Hospitalization for treatment of Septic shock due to right sided colitis  Goal:  Over the next 30 days, the patient will not experience hospital readmission  Interventions:  Transitions of Care: Doctor Visits  - discussed the importance of doctor visits Reviewed Signs and symptoms of infection Reviewed Dialysis Days - Tu, Th, Sat - patient states no urine output    Patient Self Care Activities:  Attend all scheduled provider appointments Call pharmacy for medication refills 3-7 days in advance of running out of medications Call provider office for new concerns or questions  Notify RN Care Manager of TOC call rescheduling needs Participate in Transition of Care Program/Attend TOC scheduled calls Take medications as prescribed   Call GI and schedule follow up appt with Dr Norleen Kiang  Notify provider with return of diarrhea or abdominal pain, or fever  Notify provider with any falls Keep  hospital follow up appt with PCP 11/16/24  Plan:  Telephone follow up appointment with care management team member scheduled for:  11/21/24 in the afternoon  The patient has been provided with contact information for the care management team and has been advised to call with any health related questions or concerns.         Shona Prow RN, CCM [place]  VBCI-Population Health RN Care Manager [phone]

## 2024-11-13 NOTE — Telephone Encounter (Signed)
 Noted. Thanks.

## 2024-11-14 ENCOUNTER — Ambulatory Visit: Payer: Self-pay

## 2024-11-14 ENCOUNTER — Telehealth: Payer: Self-pay | Admitting: Neurology

## 2024-11-14 NOTE — Telephone Encounter (Signed)
 Left a message with the after hour service on 11-13-24   Caller states that she was seen in Oct and needs to speak to someone. Her Neuropathy is getting worse and it feels like fire going up her leg in the pelvic area. She would like to know what to do to help with this  please call

## 2024-11-14 NOTE — Telephone Encounter (Signed)
 FYI Only or Action Required?: FYI only for provider: ED advised.  Patient was last seen in primary care on 11/06/2024 by Alvia Corean CROME, FNP.  Called Nurse Triage reporting Pain.  Symptoms began yesterday.  Interventions attempted: Nothing.  Symptoms are: gradually worsening.  Triage Disposition: No disposition on file.  Patient/caregiver understands and will follow disposition?:  Reason for Disposition  Patient sounds very sick or weak to the triager  Answer Assessment - Initial Assessment Questions Recently admitted to ICU. Patient reports having neuropathy previously, but this feels different. Patient called neurologist office and spoke with an RN there, she was told be seen within 4 hours but they had nothing available and advised patient to ED if unable to get an appointment or walk in clinic. No available appointments available, agrees with other RN disposition to be seen within 4 hours, either ED or UC. Patient verbalizes understanding.   1. ONSET: When did the pain start?      Yesterday afternoon  2. LOCATION: Where is the pain located?      From feet all the way up to the peritoneum   3. PAIN: How bad is the pain?    (Scale 1-10; or mild, moderate, severe)     Feels different than regular neuropathy, describes it as a burning sensation felt like a 8-9/10, has since took some medication and now its 6-7/10.  4. CAUSE: What do you think is causing the leg pain?     Unsure  5. OTHER SYMPTOMS: Do you have any other symptoms? (e.g., chest pain, back pain, breathing difficulty, swelling, rash, fever, numbness, weakness)     Denies  Protocols used: Leg Pain-A-AH  Copied from CRM #[id number]. Topic: Clinical - Red Word Triage >> Nov 14, 2024  2:40 PM Ashley SAUNDERS wrote: Red Word that prompted transfer to Nurse Triage: ED to hospital last week for Infection from dialysis, intensive care, discharged Monday. after dialysis yesterday started having pain/neuropathy in feet  up to perineum. Need to be seen within 4 hours per neurologist, no appts there today

## 2024-11-15 NOTE — Telephone Encounter (Signed)
 Spoke with patient, she is doing much better today pain wise. She states her neurologist told her dialysis can make neuropathy flare, and make it a lot worse. Patient believes that is what was causing her pain. She took a dose of Oxycodone  yesterday, has felt much better since then. Patient would like to keep HFU for tomorrow.

## 2024-11-16 ENCOUNTER — Telehealth: Payer: Self-pay | Admitting: Family Medicine

## 2024-11-16 ENCOUNTER — Encounter: Payer: Self-pay | Admitting: Family Medicine

## 2024-11-16 ENCOUNTER — Ambulatory Visit: Payer: Self-pay

## 2024-11-16 ENCOUNTER — Ambulatory Visit: Admitting: Family Medicine

## 2024-11-16 ENCOUNTER — Telehealth: Payer: Self-pay | Admitting: Gastroenterology

## 2024-11-16 VITALS — BP 110/72 | HR 78 | Temp 98.1°F | Ht 61.0 in | Wt 183.6 lb

## 2024-11-16 DIAGNOSIS — Z7985 Long-term (current) use of injectable non-insulin antidiabetic drugs: Secondary | ICD-10-CM

## 2024-11-16 DIAGNOSIS — K559 Vascular disorder of intestine, unspecified: Secondary | ICD-10-CM

## 2024-11-16 DIAGNOSIS — E871 Hypo-osmolality and hyponatremia: Secondary | ICD-10-CM

## 2024-11-16 DIAGNOSIS — A419 Sepsis, unspecified organism: Secondary | ICD-10-CM

## 2024-11-16 DIAGNOSIS — N186 End stage renal disease: Secondary | ICD-10-CM

## 2024-11-16 DIAGNOSIS — D649 Anemia, unspecified: Secondary | ICD-10-CM

## 2024-11-16 DIAGNOSIS — G629 Polyneuropathy, unspecified: Secondary | ICD-10-CM

## 2024-11-16 DIAGNOSIS — E1165 Type 2 diabetes mellitus with hyperglycemia: Secondary | ICD-10-CM

## 2024-11-16 DIAGNOSIS — R197 Diarrhea, unspecified: Secondary | ICD-10-CM

## 2024-11-16 DIAGNOSIS — D631 Anemia in chronic kidney disease: Secondary | ICD-10-CM

## 2024-11-16 LAB — CBC WITH DIFFERENTIAL/PLATELET
Basophils Absolute: 0.1 K/uL (ref 0.0–0.1)
Basophils Relative: 0.8 % (ref 0.0–3.0)
Eosinophils Absolute: 0.3 K/uL (ref 0.0–0.7)
Eosinophils Relative: 2.4 % (ref 0.0–5.0)
HCT: 33.1 % — ABNORMAL LOW (ref 36.0–46.0)
Hemoglobin: 10.6 g/dL — ABNORMAL LOW (ref 12.0–15.0)
Lymphocytes Relative: 24.6 % (ref 12.0–46.0)
Lymphs Abs: 3.2 K/uL (ref 0.7–4.0)
MCHC: 32.2 g/dL (ref 30.0–36.0)
MCV: 96.4 fl (ref 78.0–100.0)
Monocytes Absolute: 0.9 K/uL (ref 0.1–1.0)
Monocytes Relative: 7.2 % (ref 3.0–12.0)
Neutro Abs: 8.4 K/uL — ABNORMAL HIGH (ref 1.4–7.7)
Neutrophils Relative %: 65 % (ref 43.0–77.0)
Platelets: 340 K/uL (ref 150.0–400.0)
RBC: 3.43 Mil/uL — ABNORMAL LOW (ref 3.87–5.11)
RDW: 15.6 % — ABNORMAL HIGH (ref 11.5–15.5)
WBC: 12.9 K/uL — ABNORMAL HIGH (ref 4.0–10.5)

## 2024-11-16 LAB — COMPREHENSIVE METABOLIC PANEL WITH GFR
ALT: 38 U/L — ABNORMAL HIGH (ref 0–35)
AST: 18 U/L (ref 0–37)
Albumin: 4.4 g/dL (ref 3.5–5.2)
Alkaline Phosphatase: 95 U/L (ref 39–117)
BUN: 35 mg/dL — ABNORMAL HIGH (ref 6–23)
CO2: 29 meq/L (ref 19–32)
Calcium: 10.1 mg/dL (ref 8.4–10.5)
Chloride: 90 meq/L — ABNORMAL LOW (ref 96–112)
Creatinine, Ser: 4.72 mg/dL (ref 0.40–1.20)
GFR: 8.73 mL/min — CL (ref >60.00)
Glucose, Bld: 147 mg/dL — ABNORMAL HIGH (ref 70–99)
Potassium: 3.4 meq/L — ABNORMAL LOW (ref 3.5–5.1)
Sodium: 137 meq/L (ref 135–145)
Total Bilirubin: 0.4 mg/dL (ref 0.2–1.2)
Total Protein: 7.5 g/dL (ref 6.0–8.3)

## 2024-11-16 LAB — VITAMIN B12: Vitamin B-12: 647 pg/mL (ref 211–911)

## 2024-11-16 LAB — MAGNESIUM: Magnesium: 2.4 mg/dL (ref 1.5–2.5)

## 2024-11-16 NOTE — Patient Instructions (Addendum)
 I am so glad you are feeling better!   We are checking labs today, will be in contact with any results that require further attention  Follow up with GI and dialysis as scheduled.   Follow-up with me for new or worsening symptoms, with Dr Garald next month as scheduled

## 2024-11-16 NOTE — Telephone Encounter (Signed)
 Received call from Sa from lab with critical lab results: Creatinine 4.72 and GFR 8.73; results reported to Dr. Charlies Bellini on 11/16/24 at 1803.

## 2024-11-16 NOTE — Progress Notes (Signed)
 Acute Office Visit  Subjective:     Patient ID: [name], female    DOB: [birth year], 73 y.o.   MRN: [id number]  Chief Complaint  Patient presents with   Acute Visit    Hospital f/u C/o diarrhea still occurring yesterday and today once each day      HPI  Discussed the use of AI scribe software for clinical note transcription with the patient, who gave verbal consent to proceed.  History of Present Illness [name] is a 73 year old female with chronic kidney disease and diabetes who presents with diarrhea and neuropathy.  HFU from 11/06/24-11/12/24 at [facility] for septic shock. TOC call 11/13/24  Diarrhea - Three episodes of liquid stool since yesterday (two yesterday, one today) - Less than ten liquid stools since Monday - Uses Lomotil and Imodium for symptom control - Concerned about fluid loss due to restricted fluid intake of 32 ounces per day  Peripheral neuropathy - Worsening neuropathy with burning sensation - Distribution has progressed from knees to perineum - Uses oxycodone  and Advil for pain control - Occasionally uses Ativan  for anxiety - Dialysis sessions exacerbate neuropathy symptoms  Diabetes mellitus management - Managed with oral medications and Ozempic  - Recent hemoglobin A1c of 6.1 - Follows strict diet to manage diabetes and dialysis requirements - Typically eats twice daily - Avoids lanthium tablets unless necessary  ESRD on Dialysis - Tues, Thurs, Sat scheduled  Balance and mobility - History of falls - Concerned about balance and mobility     ROS Per HPI      Objective:    BP 110/72 (BP Location: Right Arm, Patient Position: Sitting)   Pulse 78   Temp 98.1 F (36.7 C) (Temporal)   Ht 5' 1 (1.549 m)   Wt 183 lb 9.6 oz (83.3 kg)   SpO2 99%   BMI 34.69 kg/m    Physical Exam Vitals and nursing note reviewed.  Constitutional:      General: She is not in acute distress.    Appearance: Normal appearance.      Comments: Overall looks MUCH better than last visit  HENT:     Head: Normocephalic and atraumatic.     Right Ear: External ear normal.     Left Ear: External ear normal.     Nose: Nose normal.     Mouth/Throat:     Mouth: Mucous membranes are moist.     Pharynx: Oropharynx is clear.  Eyes:     Extraocular Movements: Extraocular movements intact.     Pupils: Pupils are equal, round, and reactive to light.  Cardiovascular:     Rate and Rhythm: Normal rate and regular rhythm.     Pulses: Normal pulses.     Heart sounds: Normal heart sounds.  Pulmonary:     Effort: Pulmonary effort is normal. No respiratory distress.     Breath sounds: Normal breath sounds. No wheezing, rhonchi or rales.  Musculoskeletal:        General: Normal range of motion.     Cervical back: Normal range of motion.     Right lower leg: No edema.     Left lower leg: No edema.  Lymphadenopathy:     Cervical: No cervical adenopathy.  Neurological:     General: No focal deficit present.     Mental Status: She is alert and oriented to person, place, and time.  Psychiatric:        Mood and Affect: Mood normal.  Thought Content: Thought content normal.     Results for orders placed or performed in visit on 11/16/24  CBC w/Diff  Result Value Ref Range   WBC 12.9 (H) 4.0 - 10.5 K/uL   RBC 3.43 (L) 3.87 - 5.11 Mil/uL   Hemoglobin 10.6 (L) 12.0 - 15.0 g/dL   HCT 66.8 (L) 63.9 - 53.9 %   MCV 96.4 78.0 - 100.0 fl   MCHC 32.2 30.0 - 36.0 g/dL   RDW 84.3 (H) 88.4 - 84.4 %   Platelets 340.0 150.0 - 400.0 K/uL   Neutrophils Relative % 65.0 43.0 - 77.0 %   Lymphocytes Relative 24.6 12.0 - 46.0 %   Monocytes Relative 7.2 3.0 - 12.0 %   Eosinophils Relative 2.4 0.0 - 5.0 %   Basophils Relative 0.8 0.0 - 3.0 %   Neutro Abs 8.4 (H) 1.4 - 7.7 K/uL   Lymphs Abs 3.2 0.7 - 4.0 K/uL   Monocytes Absolute 0.9 0.1 - 1.0 K/uL   Eosinophils Absolute 0.3 0.0 - 0.7 K/uL   Basophils Absolute 0.1 0.0 - 0.1 K/uL  Comp  Met (CMET)  Result Value Ref Range   Sodium 137 135 - 145 mEq/L   Potassium 3.4 (L) 3.5 - 5.1 mEq/L   Chloride 90 (L) 96 - 112 mEq/L   CO2 29 19 - 32 mEq/L   Glucose, Bld 147 (H) 70 - 99 mg/dL   BUN 35 (H) 6 - 23 mg/dL   Creatinine, Ser 5.27 (HH) 0.40 - 1.20 mg/dL   Total Bilirubin 0.4 0.2 - 1.2 mg/dL   Alkaline Phosphatase 95 39 - 117 U/L   AST 18 0 - 37 U/L   ALT 38 (H) 0 - 35 U/L   Total Protein 7.5 6.0 - 8.3 g/dL   Albumin  4.4 3.5 - 5.2 g/dL   GFR 1.26 (LL) >39.99 mL/min   Calcium 10.1 8.4 - 10.5 mg/dL  Vitamin A87  Result Value Ref Range   Vitamin B-12 647 211 - 911 pg/mL  Magnesium  Result Value Ref Range   Magnesium 2.4 1.5 - 2.5 mg/dL        Assessment & Plan:   Assessment and Plan Assessment & Plan Colonic ischemia, diarrhea, with recent septic shock Intermittent diarrhea likely due to residual colonic ischemia. Imaging shows low blood flow to right colon. Electrolyte loss concern due to diarrhea and fluid restriction. - Checked CBC for WBC and hemoglobin. - Checked sodium levels for hyponatremia. - Continue Imodium as needed.  End-stage renal disease on dialysis Regular dialysis with recent hospitalization. Fluid and electrolyte management concerns due to diarrhea and dialysis. Recent weight loss during dialysis. - Continue regular dialysis. - Monitor fluid intake and output. - Checked sodium levels for hyponatremia.  Anemia in chronic kidney disease Anemia improved with Mocera. Ongoing hemoglobin monitoring. - Checked CBC for hemoglobin.  Type 2 diabetes mellitus with hyperglycemia without long term use of insulin , long term use non insulin  injectables Diabetes managed with oral medications and Ozempic . Hemoglobin A1c at 6.1, indicating good control. - Continue current diabetes regimen.  Polyneuropathy Exacerbation of neuropathy symptoms, possibly due to B12 deficiency. B12 supplementation may be insufficient. - Checked B12 levels. - Continue B12  supplementation during dialysis.  Hyponatremia Sodium levels need monitoring due to fluid restrictions and diarrhea. - Checked sodium levels for hyponatremia.     Orders Placed This Encounter  Procedures   CBC w/Diff   Comp Met (CMET)   Vitamin B12   Magnesium  No orders of the defined types were placed in this encounter.   Return if symptoms worsen or fail to improve.  Corean LITTIE Ku, FNP

## 2024-11-16 NOTE — Telephone Encounter (Signed)
 Received critical lab call after hours.  Gfr 8.73 Cr 4.72 Results are consistent with pt baseline  OV note from  their visit today not completed to review Defer to treating NP and/or PCP> will forward   Electronically Signed by: Charlies Bellini, DO [facility]- [place]

## 2024-11-16 NOTE — Telephone Encounter (Signed)
 Called pt and informed her of Dr. Venus answer. She understood and no more questions were asked.

## 2024-11-17 ENCOUNTER — Other Ambulatory Visit: Payer: Self-pay

## 2024-11-17 ENCOUNTER — Encounter (HOSPITAL_COMMUNITY): Payer: Self-pay

## 2024-11-17 ENCOUNTER — Inpatient Hospital Stay (HOSPITAL_COMMUNITY)
Admission: EM | Admit: 2024-11-17 | Discharge: 2024-11-21 | DRG: 393 | Disposition: A | Discharge status: Home / Self Care | Attending: Internal Medicine | Admitting: Internal Medicine

## 2024-11-17 ENCOUNTER — Encounter (HOSPITAL_COMMUNITY): Payer: Self-pay | Admitting: *Deleted

## 2024-11-17 ENCOUNTER — Emergency Department (HOSPITAL_COMMUNITY)

## 2024-11-17 ENCOUNTER — Ambulatory Visit (HOSPITAL_COMMUNITY): Admission: EM | Admit: 2024-11-17 | Discharge: 2024-11-17 | Disposition: A | Discharge status: Home / Self Care

## 2024-11-17 DIAGNOSIS — K529 Noninfective gastroenteritis and colitis, unspecified: Secondary | ICD-10-CM | POA: Diagnosis present

## 2024-11-17 DIAGNOSIS — I9589 Other hypotension: Secondary | ICD-10-CM | POA: Diagnosis present

## 2024-11-17 DIAGNOSIS — I120 Hypertensive chronic kidney disease with stage 5 chronic kidney disease or end stage renal disease: Secondary | ICD-10-CM | POA: Diagnosis present

## 2024-11-17 DIAGNOSIS — D631 Anemia in chronic kidney disease: Secondary | ICD-10-CM | POA: Diagnosis present

## 2024-11-17 DIAGNOSIS — Z83438 Family history of other disorder of lipoprotein metabolism and other lipidemia: Secondary | ICD-10-CM

## 2024-11-17 DIAGNOSIS — G4730 Sleep apnea, unspecified: Secondary | ICD-10-CM | POA: Diagnosis present

## 2024-11-17 DIAGNOSIS — E1129 Type 2 diabetes mellitus with other diabetic kidney complication: Secondary | ICD-10-CM | POA: Diagnosis present

## 2024-11-17 DIAGNOSIS — Z7982 Long term (current) use of aspirin: Secondary | ICD-10-CM

## 2024-11-17 DIAGNOSIS — N186 End stage renal disease: Secondary | ICD-10-CM | POA: Diagnosis present

## 2024-11-17 DIAGNOSIS — Z604 Social exclusion and rejection: Secondary | ICD-10-CM | POA: Diagnosis present

## 2024-11-17 DIAGNOSIS — Z6834 Body mass index (BMI) 34.0-34.9, adult: Secondary | ICD-10-CM

## 2024-11-17 DIAGNOSIS — F32A Depression, unspecified: Secondary | ICD-10-CM | POA: Diagnosis present

## 2024-11-17 DIAGNOSIS — Z79899 Other long term (current) drug therapy: Secondary | ICD-10-CM

## 2024-11-17 DIAGNOSIS — K559 Vascular disorder of intestine, unspecified: Secondary | ICD-10-CM

## 2024-11-17 DIAGNOSIS — I953 Hypotension of hemodialysis: Secondary | ICD-10-CM | POA: Diagnosis present

## 2024-11-17 DIAGNOSIS — K219 Gastro-esophageal reflux disease without esophagitis: Secondary | ICD-10-CM | POA: Diagnosis present

## 2024-11-17 DIAGNOSIS — M25472 Effusion, left ankle: Secondary | ICD-10-CM | POA: Diagnosis present

## 2024-11-17 DIAGNOSIS — K5731 Diverticulosis of large intestine without perforation or abscess with bleeding: Secondary | ICD-10-CM | POA: Diagnosis present

## 2024-11-17 DIAGNOSIS — Z992 Dependence on renal dialysis: Secondary | ICD-10-CM

## 2024-11-17 DIAGNOSIS — Z888 Allergy status to other drugs, medicaments and biological substances status: Secondary | ICD-10-CM

## 2024-11-17 DIAGNOSIS — K921 Melena: Principal | ICD-10-CM | POA: Diagnosis present

## 2024-11-17 DIAGNOSIS — I959 Hypotension, unspecified: Secondary | ICD-10-CM | POA: Diagnosis present

## 2024-11-17 DIAGNOSIS — E1122 Type 2 diabetes mellitus with diabetic chronic kidney disease: Secondary | ICD-10-CM | POA: Diagnosis present

## 2024-11-17 DIAGNOSIS — Z87891 Personal history of nicotine dependence: Secondary | ICD-10-CM

## 2024-11-17 DIAGNOSIS — Z823 Family history of stroke: Secondary | ICD-10-CM

## 2024-11-17 DIAGNOSIS — Z7984 Long term (current) use of oral hypoglycemic drugs: Secondary | ICD-10-CM

## 2024-11-17 DIAGNOSIS — Z96651 Presence of right artificial knee joint: Secondary | ICD-10-CM | POA: Diagnosis present

## 2024-11-17 DIAGNOSIS — Z96611 Presence of right artificial shoulder joint: Secondary | ICD-10-CM | POA: Diagnosis present

## 2024-11-17 DIAGNOSIS — Z833 Family history of diabetes mellitus: Secondary | ICD-10-CM

## 2024-11-17 DIAGNOSIS — K55039 Acute (reversible) ischemia of large intestine, extent unspecified: Principal | ICD-10-CM | POA: Diagnosis present

## 2024-11-17 DIAGNOSIS — N2581 Secondary hyperparathyroidism of renal origin: Secondary | ICD-10-CM | POA: Diagnosis present

## 2024-11-17 DIAGNOSIS — G8929 Other chronic pain: Secondary | ICD-10-CM | POA: Diagnosis present

## 2024-11-17 DIAGNOSIS — E876 Hypokalemia: Secondary | ICD-10-CM | POA: Diagnosis present

## 2024-11-17 DIAGNOSIS — E669 Obesity, unspecified: Secondary | ICD-10-CM | POA: Diagnosis present

## 2024-11-17 DIAGNOSIS — E1142 Type 2 diabetes mellitus with diabetic polyneuropathy: Secondary | ICD-10-CM | POA: Diagnosis present

## 2024-11-17 DIAGNOSIS — Z8249 Family history of ischemic heart disease and other diseases of the circulatory system: Secondary | ICD-10-CM

## 2024-11-17 DIAGNOSIS — M797 Fibromyalgia: Secondary | ICD-10-CM | POA: Diagnosis present

## 2024-11-17 DIAGNOSIS — F419 Anxiety disorder, unspecified: Secondary | ICD-10-CM | POA: Diagnosis present

## 2024-11-17 DIAGNOSIS — Z7985 Long-term (current) use of injectable non-insulin antidiabetic drugs: Secondary | ICD-10-CM

## 2024-11-17 DIAGNOSIS — D72829 Elevated white blood cell count, unspecified: Secondary | ICD-10-CM | POA: Diagnosis present

## 2024-11-17 DIAGNOSIS — D649 Anemia, unspecified: Secondary | ICD-10-CM | POA: Diagnosis present

## 2024-11-17 DIAGNOSIS — K625 Hemorrhage of anus and rectum: Secondary | ICD-10-CM

## 2024-11-17 HISTORY — DX: Dependence on renal dialysis: Z99.2

## 2024-11-17 LAB — CBC
HCT: 34.3 % — ABNORMAL LOW (ref 36.0–46.0)
Hemoglobin: 10.6 g/dL — ABNORMAL LOW (ref 12.0–15.0)
MCH: 30.8 pg (ref 26.0–34.0)
MCHC: 30.9 g/dL (ref 30.0–36.0)
MCV: 99.7 fL (ref 80.0–100.0)
Platelets: 357 K/uL (ref 150–400)
RBC: 3.44 MIL/uL — ABNORMAL LOW (ref 3.87–5.11)
RDW: 14.6 % (ref 11.5–15.5)
WBC: 17.4 K/uL — ABNORMAL HIGH (ref 4.0–10.5)
nRBC: 0.1 % (ref 0.0–0.2)

## 2024-11-17 LAB — GLUCOSE, CAPILLARY
Glucose-Capillary: 82 mg/dL (ref 70–99)
Glucose-Capillary: 159 mg/dL — ABNORMAL HIGH (ref 70–99)

## 2024-11-17 LAB — COMPREHENSIVE METABOLIC PANEL WITH GFR
ALT: 33 U/L (ref 0–44)
AST: 24 U/L (ref 15–41)
Albumin: 3.5 g/dL (ref 3.5–5.0)
Alkaline Phosphatase: 93 U/L (ref 38–126)
Anion gap: 18 — ABNORMAL HIGH (ref 5–15)
BUN: 12 mg/dL (ref 8–23)
CO2: 28 mmol/L (ref 22–32)
Calcium: 8.6 mg/dL — ABNORMAL LOW (ref 8.9–10.3)
Chloride: 90 mmol/L — ABNORMAL LOW (ref 98–111)
Creatinine, Ser: 2.41 mg/dL — ABNORMAL HIGH (ref 0.44–1.00)
GFR, Estimated: 21 mL/min — ABNORMAL LOW (ref >60)
Glucose, Bld: 214 mg/dL — ABNORMAL HIGH (ref 70–99)
Potassium: 3.1 mmol/L — ABNORMAL LOW (ref 3.5–5.1)
Sodium: 136 mmol/L (ref 135–145)
Total Bilirubin: 0.5 mg/dL (ref 0.0–1.2)
Total Protein: 7.4 g/dL (ref 6.5–8.1)

## 2024-11-17 LAB — C-REACTIVE PROTEIN: CRP: 1.4 mg/dL — ABNORMAL HIGH (ref <1.0)

## 2024-11-17 LAB — POC OCCULT BLOOD, ED: Fecal Occult Bld: POSITIVE — AB

## 2024-11-17 LAB — HEMOGLOBIN AND HEMATOCRIT, BLOOD
HCT: 30.3 % — ABNORMAL LOW (ref 36.0–46.0)
HCT: 29.1 % — ABNORMAL LOW (ref 36.0–46.0)
HCT: 29.8 % — ABNORMAL LOW (ref 36.0–46.0)
Hemoglobin: 9.6 g/dL — ABNORMAL LOW (ref 12.0–15.0)
Hemoglobin: 9.4 g/dL — ABNORMAL LOW (ref 12.0–15.0)
Hemoglobin: 9.4 g/dL — ABNORMAL LOW (ref 12.0–15.0)

## 2024-11-17 LAB — SEDIMENTATION RATE: Sed Rate: 48 mm/h — ABNORMAL HIGH (ref 0–22)

## 2024-11-17 LAB — TYPE AND SCREEN
ABO/RH(D): O POS
Antibody Screen: NEGATIVE

## 2024-11-17 MED ORDER — METRONIDAZOLE 500 MG/100ML IV SOLN
500.0000 mg | Freq: Once | INTRAVENOUS | Status: DC
  Filled 2024-11-17: qty 100

## 2024-11-17 MED ORDER — SODIUM CHLORIDE 0.9 % IV BOLUS
500.0000 mL | Freq: Once | INTRAVENOUS | Status: AC
  Administered 2024-11-17: 500 mL via INTRAVENOUS

## 2024-11-17 MED ORDER — INSULIN ASPART 100 UNIT/ML IJ SOLN
0.0000 [IU] | Freq: Three times a day (TID) | INTRAMUSCULAR | Status: DC
  Administered 2024-11-18 – 2024-11-21 (×5): 1 [IU] via SUBCUTANEOUS
  Filled 2024-11-17 (×6): qty 1

## 2024-11-17 MED ORDER — OXYCODONE HCL 5 MG PO TABS
15.0000 mg | ORAL_TABLET | Freq: Four times a day (QID) | ORAL | Status: DC | PRN
  Administered 2024-11-17 – 2024-11-20 (×4): 15 mg via ORAL
  Filled 2024-11-17 (×4): qty 3

## 2024-11-17 MED ORDER — ACETAMINOPHEN 650 MG RE SUPP: 650.0000 mg | Freq: Four times a day (QID) | RECTAL | Status: DC | PRN

## 2024-11-17 MED ORDER — CINACALCET HCL 30 MG PO TABS
30.0000 mg | ORAL_TABLET | ORAL | Status: DC
  Administered 2024-11-20: 30 mg via ORAL
  Filled 2024-11-17: qty 1

## 2024-11-17 MED ORDER — ALBUTEROL SULFATE (2.5 MG/3ML) 0.083% IN NEBU: 2.5000 mg | INHALATION_SOLUTION | Freq: Four times a day (QID) | RESPIRATORY_TRACT | Status: DC | PRN

## 2024-11-17 MED ORDER — IOHEXOL 350 MG/ML SOLN
100.0000 mL | Freq: Once | INTRAVENOUS | Status: AC | PRN
  Administered 2024-11-17: 100 mL via INTRAVENOUS

## 2024-11-17 MED ORDER — POTASSIUM CHLORIDE CRYS ER 20 MEQ PO TBCR: 20.0000 meq | EXTENDED_RELEASE_TABLET | Freq: Once | ORAL | Status: DC

## 2024-11-17 MED ORDER — SODIUM CHLORIDE 0.9 % IV SOLN
2.0000 g | Freq: Once | INTRAVENOUS | Status: AC
  Administered 2024-11-17: 2 g via INTRAVENOUS
  Filled 2024-11-17: qty 20

## 2024-11-17 MED ORDER — MIDODRINE HCL 5 MG PO TABS
10.0000 mg | ORAL_TABLET | Freq: Three times a day (TID) | ORAL | Status: DC
  Administered 2024-11-17 – 2024-11-21 (×11): 10 mg via ORAL
  Filled 2024-11-17 (×11): qty 2

## 2024-11-17 MED ORDER — ACETAMINOPHEN 325 MG PO TABS
650.0000 mg | ORAL_TABLET | Freq: Four times a day (QID) | ORAL | Status: DC | PRN
  Administered 2024-11-17 – 2024-11-20 (×3): 650 mg via ORAL
  Filled 2024-11-17 (×2): qty 2

## 2024-11-17 MED ORDER — MUSCLE RUB 10-15 % EX CREA
TOPICAL_CREAM | CUTANEOUS | Status: DC | PRN
  Filled 2024-11-17: qty 85

## 2024-11-17 MED ORDER — FLUOXETINE HCL 20 MG PO CAPS
40.0000 mg | ORAL_CAPSULE | Freq: Every day | ORAL | Status: DC
  Administered 2024-11-18 – 2024-11-21 (×3): 40 mg via ORAL
  Filled 2024-11-17 (×3): qty 2

## 2024-11-17 MED ORDER — CALCITRIOL 0.5 MCG PO CAPS
2.7500 ug | ORAL_CAPSULE | ORAL | Status: DC
  Administered 2024-11-20: 2.75 ug via ORAL
  Filled 2024-11-17: qty 1

## 2024-11-17 MED ORDER — BUPROPION HCL ER (XL) 150 MG PO TB24
300.0000 mg | ORAL_TABLET | Freq: Every day | ORAL | Status: DC
  Administered 2024-11-18 – 2024-11-21 (×3): 300 mg via ORAL
  Filled 2024-11-17 (×3): qty 2

## 2024-11-17 MED ORDER — SODIUM CHLORIDE 0.9% FLUSH
3.0000 mL | Freq: Two times a day (BID) | INTRAVENOUS | Status: DC
  Administered 2024-11-17 – 2024-11-18 (×3): 3 mL via INTRAVENOUS
  Administered 2024-11-18: 10 mL via INTRAVENOUS
  Administered 2024-11-21: 3 mL via INTRAVENOUS

## 2024-11-17 NOTE — ED Triage Notes (Addendum)
 Pt came from hemodialysis. States had an episode of brown diarrhea yest AM; last night had diarrhea with bright red blood, and continued for 3 more episodes throughout the night. Took a dose of Imodium. States just had an episode of bright red blood that filled the toilet without stool while in waiting area. Denies any lightheadedness or dizziness. Denies any pain.

## 2024-11-17 NOTE — Consult Note (Addendum)
 Consultation  Referring Provider: TRH/ Claudene Primary Care Physician:  Garald Karlynn GAILS, MD Primary Gastroenterologist:  Dr. Abran  Reason for Consultation: Rectal bleeding in setting of very recent admission with right sided ischemic colitis  HPI: [name] is a 73 y.o. female, retired endoscopy nurse, known to Dr. Abran with history of end-stage renal disease on dialysis, history of chronic GERD, diverticulosis, diabetes mellitus, hypertension, peripheral neuropathy and fibromyalgia. She presented to the ER today after she had onset of rectal bleeding, and mild abdominal cramping last evening She had an episode of grossly bloody stool, this morning she had another episode of bloody stool and then after leaving dialysis today she had another bloody stool in the ER waiting room.  Patient had just been discharged from the hospital on 11/12/2024 after admission with a right sided ischemic colitis.  That episode was associated with severe abdominal pain in the right abdomen and fever.  She met sepsis criteria and was admitted to the intensive care unit, and required pressor support..  CT imaging at that time consistent with right-sided ischemic colitis She had significant leukocytosis in the 27,000 range.  She was treated with antibiotics/7 days of Zosyn .  [facility] surgery had been consulted but CT angiogram did not show any focal lesion and did not feel intervention was possible or indicated.  Patient does have history of chronic hypotension and is currently requiring midodrine .  She says she is usually hypotensive with dialysis.  It sounds as if she had an extra session of dialysis this week.  Tolerated dialysis this morning.  Again endorses hypotension.  She has been following very strict fluid restriction protocol and dietary restriction from a renal standpoint.  CT angio today shows patent mesenteric vessels, stable 4 mm ground glass nodule in the right lower lobe, no focal liver  lesions, there are a few scattered diverticuli in the colon no evidence of diverticulitis, there is persistent circumferential wall thickening involving the ascending colon with mild associated fat stranding, appendix not seen, there are also enlarged lymph nodes in the right lower quadrant adjacent to this region of colonic wall thickening.  This process may be infectious inflammatory or neoplastic.  Labs today-WBC 17.4/hemoglobin 10.6/hematocrit 34.3 stable Potassium 3.1 BUN 12/creatinine 2.4 LFTs within normal limits.  She is not on blood thinners.  She does take NSAIDs on a fairly regular basis for severe arthritic symptoms She is not having any severe pain with this episode and is hemodynamically stable though blood pressures have intermittently bit on the soft side since arrival in the ER.  No associated nausea or vomiting, no diaphoresis.  Patient last had colonoscopy in March 2023 noted to have hemorrhoids and a poor prep, there were diffuse severe mucosal changes with friability at the hepatic flexure consistent with ischemic colitis.  Diverticulosis noted, no polyps.   Past Medical History:  Diagnosis Date   Anemia    Anxiety    Brain tumor (benign) ([name])    Colitis 2010   microscopic- Dr Abran   Depression    Diabetes mellitus    type II   Diverticulosis    Dyspnea    with exertion   ESRD (end stage renal disease) ([name])    TTUSAT [address]    Fibromyalgia    GERD (gastroesophageal reflux disease)    Headache    Hemodialysis patient    History of blood transfusion    after knee surgery   Hypertension    discontinued all diuretics  and antihypertensives   IBS (irritable bowel syndrome)    LBP (low back pain)    Neuropathy    feet bilat    Osteoarthritis    Osteopenia    Pneumonia    hx of 2014    Rotator cuff tear, right    Sinusitis    currently being treated with antibiotic will complete 03/04/2015    Past Surgical History:  Procedure Laterality Date    A/V FISTULAGRAM N/A 02/03/2024   Procedure: A/V Fistulagram;  Surgeon: Norine Manuelita LABOR, MD;  Location: [facility];  Service: Cardiovascular;  Laterality: N/A;   A/V SHUNT INTERVENTION Right 05/30/2024   Procedure: A/V SHUNT INTERVENTION;  Surgeon: Pearline Norman RAMAN, MD;  Location: [facility];  Service: Cardiovascular;  Laterality: Right;   A/V SHUNT INTERVENTION N/A 09/07/2024   Procedure: A/V SHUNT INTERVENTION;  Surgeon: Lanis Fonda BRAVO, MD;  Location: [facility];  Service: Cardiovascular;  Laterality: N/A;   AV FISTULA PLACEMENT Right 09/12/2020   Procedure: RIGHT ARTERIOVENOUS (AV) FISTULA CREATION;  Surgeon: Harvey Carlin BRAVO, MD;  [facility];  Service: Vascular;  Laterality: Right;   BASCILIC VEIN TRANSPOSITION Right 10/27/2020   Procedure: RIGHT UPPER EXTREMITY SECOND STAGE BASCILIC VEIN TRANSPOSITION;  Surgeon: Harvey Carlin BRAVO, MD;  Location: [facility] OR;  Service: Vascular;  Laterality: Right;   BIOPSY  03/11/2022   Procedure: BIOPSY;  Surgeon: Wilhelmenia Aloha Raddle., MD;  Location: [facility] ENDOSCOPY;  Service: Gastroenterology;;   COLONOSCOPY WITH PROPOFOL  N/A 03/11/2022   Procedure: COLONOSCOPY WITH PROPOFOL ;  Surgeon: Wilhelmenia Aloha Raddle., MD;  Location: [facility] ENDOSCOPY;  Service: Gastroenterology;  Laterality: N/A;   foramen magnum ependymoma surgery  2003   Dr Mora   JOINT REPLACEMENT Bilateral    NASAL SINUS SURGERY     1973    PERIPHERAL VASCULAR BALLOON ANGIOPLASTY  02/03/2024   Procedure: PERIPHERAL VASCULAR BALLOON ANGIOPLASTY;  Surgeon: Norine Manuelita LABOR, MD;  Location: [facility];  Service: Cardiovascular;;  Inflow Basilic Vein   REVERSE SHOULDER ARTHROPLASTY Right 06/03/2022   Procedure: REVERSE SHOULDER ARTHROPLASTY;  Surgeon: Melita Drivers, MD;  Location: [facility] ORS;  Service: Orthopedics;  Laterality: Right;    TONSILLECTOMY     TOTAL KNEE ARTHROPLASTY     L 2008, R 2009, R 2016- Dr hiram   TOTAL KNEE REVISION Right 03/05/2015   Procedure: RIGHT TOTAL  KNEE ARTHROPLASTY REVISION;  Surgeon: Dempsey Moan, MD;  Location: [facility] ORS;  Service: Orthopedics;  Laterality: Right;   VENOUS ANGIOPLASTY  05/30/2024   Procedure: VENOUS ANGIOPLASTY;  Surgeon: Pearline Norman RAMAN, MD;  Location: [facility];  Service: Cardiovascular;;  central   VENOUS ANGIOPLASTY  09/07/2024   Procedure: VENOUS ANGIOPLASTY;  Surgeon: Lanis Fonda BRAVO, MD;  Location: [facility];  Service: Cardiovascular;;  innominate 70%    Prior to Admission medications   Medication Sig Start Date End Date Taking? Authorizing Provider  Accu-Chek Softclix Lancets lancets Use 1 lancet up to 4 times daily as directed 03/12/22   Samtani, Jai-Gurmukh, MD  acetaminophen  (TYLENOL ) 500 MG tablet Take 1,000 mg by mouth 2 (two) times daily as needed for headache.    [provider]  aspirin -acetaminophen -caffeine (EXCEDRIN MIGRAINE) 250-250-65 MG tablet Take 1 tablet by mouth daily as needed for headache or migraine. Max 3 doses in one week Patient not taking: Reported on 11/16/2024    [provider]  B Complex-C-Folic Acid  (RENA-VITE PO) Take 1 tablet by mouth every morning.    [provider]  buPROPion  (WELLBUTRIN  XL) 300 MG 24 hr tablet Take 1 tablet (300 mg total) by mouth in the morning. 10/19/24   Plotnikov, Aleksei V, MD  carboxymethylcellulose (REFRESH PLUS) 0.5 % SOLN Place 1 drop into both eyes 3 (three) times daily as needed (dry eyes/irritation).    [provider]  cinacalcet  (SENSIPAR ) 30 MG tablet Take 30 mg by mouth Every Tuesday,Thursday,and Saturday with dialysis.    [provider]  cyclobenzaprine  (FLEXERIL ) 10 MG tablet Take 1 tablet (10 mg total) by mouth 3 (three) times daily as needed for muscle spasms. 09/12/24   Plotnikov, Aleksei V, MD  diphenhydramine -acetaminophen  (TYLENOL  PM) 25-500 MG TABS tablet Take 2 tablets by mouth 3 (three) times a week. At bedtime Patient taking differently: Take 2 tablets by mouth 3 (three) times a week. At  bedtime  - Takes as needed    [provider]  docusate sodium  (COLACE) 50 MG capsule Take 50 mg by mouth 2 (two) times daily.    [provider]  FLUoxetine  (PROZAC ) 40 MG capsule Take 1 capsule (40 mg total) by mouth in the morning. 08/26/23     fluticasone  (FLONASE ) 50 MCG/ACT nasal spray Place 2 sprays into both nostrils daily. Patient not taking: Reported on 11/16/2024 11/30/23   Soldatova, Liuba, MD  glimepiride  (AMARYL ) 1 MG tablet Take 1 tablet (1 mg total) by mouth daily with breakfast. 10/19/24   Plotnikov, Karlynn GAILS, MD  ketoconazole  (NIZORAL ) 2 % cream Apply 1 Application topically daily. 04/04/23   Plotnikov, Aleksei V, MD  lanthanum (FOSRENOL) 1000 MG chewable tablet Chew 1,000-2,000 mg by mouth See admin instructions. Taking 2000 mg with meals and 1000 mg  with snacks    [provider]  LORazepam  (ATIVAN ) 2 MG tablet Take 1 tablet (2 mg total) by mouth 2 (two) times daily as needed for anxiety. 03/14/24     midodrine  (PROAMATINE ) 10 MG tablet Take 3 tablets (30 mg total) by mouth 3 (three) times a week. Take 30 minutes before dialysis and extra tablet during dialysis. Patient taking differently: Take 10 mg by mouth 3 (three) times a week. Taking 1 3x a day 11/21/23   Marlee Bernardino NOVAK, MD  Multiple Vitamins-Minerals (PRESERVISION AREDS 2) CAPS Take 1 capsule by mouth daily at 12 noon.    [provider]  naloxone  (NARCAN ) nasal spray 4 mg/0.1 mL Take by nasal route every 3 minutes until patient awakes or EMS arrives. Patient not taking: Reported on 11/16/2024 01/20/22     ondansetron  (ZOFRAN ) 4 MG tablet Take 1 tablet (4 mg total) by mouth every 8 (eight) hours as needed for nausea or vomiting. Patient not taking: Reported on 11/16/2024 06/03/22   Shuford, Randine, PA-C  oxyCODONE  (ROXICODONE ) 15 MG immediate release tablet Take 1 tablet (15 mg total) by mouth 4 (four) times daily as needed. 02/22/24     pantoprazole  (PROTONIX ) 40 MG tablet TAKE 1 TABLET BY MOUTH  TWICE  DAILY 09/28/24   Abran Norleen SAILOR, MD  pilocarpine  (SALAGEN ) 5 MG tablet TAKE 1 TABLET BY MOUTH TWICE  DAILY 05/24/24   Plotnikov, Aleksei V, MD  pioglitazone  (ACTOS ) 15 MG tablet Take 1 tablet (15 mg total) by mouth daily. 10/19/24   Plotnikov, Aleksei V, MD  polyethylene glycol (MIRALAX  / GLYCOLAX ) 17 g packet Take 17 g by mouth daily as needed for moderate constipation.    [provider]  PREVIDENT 5000 DRY MOUTH 1.1 % GEL dental gel Place 1 application  onto teeth in  the morning and at bedtime. 10/25/21   [provider]  REPATHA  SURECLICK 140 MG/ML SOAJ INJECT 1 PEN SUBCUTANEOUSLY  EVERY 2 WEEKS 10/24/24   Croitoru, Mihai, MD  Semaglutide ,0.25 or 0.5MG /DOS, (OZEMPIC , 0.25 OR 0.5 MG/DOSE,) 2 MG/3ML SOPN INJECT SUBCUTANEOUSLY 0.5 MG  EVERY WEEK 11/04/24   Plotnikov, Aleksei V, MD  sodium chloride  (OCEAN) 0.65 % SOLN nasal spray Place 1 spray into both nostrils as needed. Patient taking differently: Place 1 spray into both nostrils as needed for congestion. PRN 05/04/24   Soldatova, Liuba, MD    Current Facility-Administered Medications  Medication Dose Route Frequency Provider Last Rate Last Admin   acetaminophen  (TYLENOL ) tablet 650 mg  650 mg Oral Q6H PRN Smith, Rondell A, MD   650 mg at 11/17/24 1717   Or   acetaminophen  (TYLENOL ) suppository 650 mg  650 mg Rectal Q6H PRN Claudene Maximino LABOR, MD       albuterol  (PROVENTIL ) (2.5 MG/3ML) 0.083% nebulizer solution 2.5 mg  2.5 mg Nebulization Q6H PRN Claudene Maximino LABOR, MD       [START ON 11/18/2024] buPROPion  (WELLBUTRIN  XL) 24 hr tablet 300 mg  300 mg Oral q AM Smith, Rondell A, MD       [START ON 11/20/2024] cinacalcet  (SENSIPAR ) tablet 30 mg  30 mg Oral Q T,Th,Sa-HD Claudene Maximino LABOR, MD       [START ON 11/18/2024] FLUoxetine  (PROZAC ) capsule 40 mg  40 mg Oral q AM Claudene, Rondell A, MD       insulin  aspart (novoLOG ) injection 0-6 Units  0-6 Units Subcutaneous TID WC Smith, Rondell A, MD       midodrine  (PROAMATINE ) tablet 10 mg   10 mg Oral TID WC Smith, Rondell A, MD   10 mg at 11/17/24 1710   oxyCODONE  (Oxy IR/ROXICODONE ) immediate release tablet 15 mg  15 mg Oral QID PRN Smith, Rondell A, MD       sodium chloride  flush (NS) 0.9 % injection 3 mL  3 mL Intravenous Q12H Smith, Rondell A, MD   3 mL at 11/17/24 1656    Allergies as of 11/17/2024 - Review Complete 11/17/2024  Allergen Reaction Noted   Erythromycin  Diarrhea and Nausea And Vomiting    Gabapentin Other (See Comments) 09/05/2020    Family History  Problem Relation Age of Onset   Depression Mother    Hypertension Mother    Stroke Mother 59   Diabetes Mother    Diabetes Father    Hyperlipidemia Father    Hypertension Father    Crohn's disease Maternal Uncle    Diabetes Brother    Heart disease Brother    Hyperlipidemia Brother    Hypertension Brother    Prostate cancer Brother    Diabetes Brother    Heart disease Brother        Heart Disease before age 39   Heart attack Brother    Stroke Brother        X's 2   Hyperlipidemia Brother    Hypertension Brother    Diabetes Other    Colon cancer Neg Hx    Esophageal cancer Neg Hx    Stomach cancer Neg Hx    Pancreatic cancer Neg Hx    Breast cancer Neg Hx    BRCA 1/2 Neg Hx     Social History   Socioeconomic History   Marital status: Single    Spouse name: Not on file   Number of children: 0   Years of education: Not on  file   Highest education level: Not on file  Occupational History   Not on file  Tobacco Use   Smoking status: Former    Current packs/day: 0.00    Average packs/day: 1 pack/day for 20.0 years (20.0 ttl pk-yrs)    Types: Cigarettes    Start date: 12/13/1968    Quit date: 12/13/1988    Years since quitting: 35.9   Smokeless tobacco: Never  Vaping Use   Vaping status: Never Used  Substance and Sexual Activity   Alcohol  use: Not Currently    Alcohol /week: 0.0 standard drinks of alcohol     Comment: rarely   Drug use: No   Sexual activity: Not Currently  Other  Topics Concern   Not on file  Social History Narrative   Are you right handed or left handed? Right   Are you currently employed ?    What is your current occupation? Retired    Do you live at home alone?yes   Who lives with you?    What type of home do you live in: 1 story or 2 story? Apartment with Magazine Features Editor none   Social Drivers of Health   Financial Resource Strain: Low Risk  (08/17/2024)   Overall Financial Resource Strain (CARDIA)    Difficulty of Paying Living Expenses: Not hard at all  Food Insecurity: No Food Insecurity (11/17/2024)   Hunger Vital Sign    Worried About Running Out of Food in the Last Year: Never true    Ran Out of Food in the Last Year: Never true  Transportation Needs: No Transportation Needs (11/17/2024)   PRAPARE - Administrator, Civil Service (Medical): No    Lack of Transportation (Non-Medical): No  Physical Activity: Inactive (08/17/2024)   Exercise Vital Sign    Days of Exercise per Week: 0 days    Minutes of Exercise per Session: 0 min  Stress: No Stress Concern Present (08/17/2024)   [employer] of Occupational Health - Occupational Stress Questionnaire    Feeling of Stress: Not at all  Social Connections: Socially Isolated (11/17/2024)   Social Connection and Isolation Panel    Frequency of Communication with Friends and Family: More than three times a week    Frequency of Social Gatherings with Friends and Family: Twice a week    Attends Religious Services: Never    Database Administrator or Organizations: No    Attends Banker Meetings: Never    Marital Status: Never married  Intimate Partner Violence: Not At Risk (11/17/2024)   Humiliation, Afraid, Rape, and Kick questionnaire    Fear of Current or Ex-Partner: No    Emotionally Abused: No    Physically Abused: No    Sexually Abused: No    Review of Systems: Pertinent positive and negative review of systems were noted in the above HPI section.  All  other review of systems was otherwise negative.  Physical Exam: Vital signs in last 24 hours: Temp:  [97.7 F (36.5 C)-98.2 F (36.8 C)] 97.9 F (36.6 C) (12/06 1659) Pulse Rate:  [33-93] 73 (12/06 1525) Resp:  [17-22] 17 (12/06 1659) BP: (82-129)/(34-83) 129/41 (12/06 1659) SpO2:  [90 %-100 %] 96 % (12/06 1525) Weight:  [81.4 kg-82 kg] 81.4 kg (12/06 1657) Last BM Date : 11/17/24 General:   Alert,  Well-developed, somewhat chronically ill-appearing older older white female pleasant and cooperative in NAD Head:  Normocephalic and atraumatic. Eyes:  Sclera clear, no icterus.  Conjunctiva pink. Ears:  Normal auditory acuity. Nose:  No deformity, discharge,  or lesions. Mouth:  No deformity or lesions.   Neck:  Supple; no masses or thyromegaly. Lungs:  Clear throughout to auscultation.   No wheezes, crackles, or rhonchi.  Heart:  Regular rate and rhythm; no murmurs, clicks, rubs,  or gallops. Abdomen:  Soft, he is tender in the right mid and right lower quadrant, no guarding or rebound, BS active,nonpalp mass or hsm.,  No audible bruit Rectal: Not done Msk:  Symmetrical without gross deformities. . Pulses:  Normal pulses noted. Extremities:  Without clubbing or edema. Neurologic:  Alert and  oriented x4;  grossly normal neurologically. Skin:  Intact without significant lesions or rashes.. Psych:  Alert and cooperative. Normal mood and affect.  Intake/Output from previous day: No intake/output data recorded. Intake/Output this shift: Total I/O In: 500 [IV Piggyback:500] Out: -   Lab Results: Recent Labs    11/16/24 1603 11/17/24 1047 11/17/24 1543  WBC 12.9* 17.4*  --   HGB 10.6* 10.6* 9.6*  HCT 33.1* 34.3* 30.3*  PLT 340.0 357  --    BMET Recent Labs    11/16/24 1603 11/17/24 1047  NA 137 136  K 3.4* 3.1*  CL 90* 90*  CO2 29 28  GLUCOSE 147* 214*  BUN 35* 12  CREATININE 4.72* 2.41*  CALCIUM 10.1 8.6*   LFT Recent Labs    11/17/24 1047  PROT 7.4   ALBUMIN  3.5  AST 24  ALT 33  ALKPHOS 93  BILITOT 0.5   PT/INR No results for input(s): LABPROT, INR in the last 72 hours. Hepatitis Panel No results for input(s): HEPBSAG, HCVAB, HEPAIGM, HEPBIGM in the last 72 hours.    IMPRESSION:  #48 73 year old white female with end-stage renal disease on dialysis #2 chronic hypotension requiring midodrine   #3 very recent admission with right sided ischemic colitis associated with SIRS/sepsis, hypotension requiring pressor support.  She completed a 7-day course of Zosyn .  Symptoms improved and she was not having any rectal bleeding at the time of discharge.  Vascular surgery had been consulted but did not feel that there was any indication for any vascular procedures, as angiography showed mesenteric vessels to be patent.  She now presents with recurrent rectal bleeding, grossly bloody stool last evening and this morning.  Mild abdominal discomfort no severe pain.  She has been hemodynamically stable and hemoglobin stable currently at 10.6.  She does have a leukocytosis to 17,000.  She did dialysis this morning  Suspect that she has had an acute mild recurrent injury to the right colon/exacerbation of her right sided ischemic colitis which at this time has not healed. Suspect that hypotension during dialysis, and fluid restriction contributing to low flow state to the colon.  #4 anemia acute on chronic secondary to above #5 obese mellitus 6.  Peripheral neuropathy 7.  Hyperlipidemia  PLAN: Okay for soft diet, with renal restrictions.  Would like to liberalize her fluid restrictions as suspect that volume depletion and hypotension contributing to these episodes of ischemic colitis Continue midodrine  Will need to make nephrology aware that her dry weight may need to be adjusted, and allow liberalization of fluids in between dialysis sessions to prevent recurrent colonic injury secondary to low flow state/relative mesenteric  ischemia  Cover with IV antibiotics for now Serial hemoglobins and transfuse as indicated No indication for colonoscopy at this time GI will follow with you   Amy Esterwood PA-C 11/17/2024, 5:17 PM   Attending  physician's note  I personally saw the patient and performed a substantive portion of the medical decision making process for this encounter (including a complete performance of the key components : MDM, Hx and Exam), in conjunction with the APP.  I agree with the APP's note, impression, and  the management plan for the number and complexity of problems addressed at the encounter for the patient and take responsibility for that plan with its inherent risk of complications, morbidity, or mortality with additional input as follows.    73 year old very pleasant female with end-stage renal disease on dialysis, chronic hypotension on midodrine   Recurrent episode of right-sided ischemic colitis, was recently hospitalized with similar episode  CTA negative for significant vascular disease  Likely etiology for recurrent ischemic colitis is severe fluid restriction, she is restricted to only 32 ounce water  per day by nephrology.  She also has chronic hypertension, is on midodrine  as outpatient.  Will defer to nephrology for fluid management, may need to consider liberalizing her fluid restrictions and also not removed too much fluid during dialysis sessions. Goal MAP> 50  Monitor hemoglobin and transfuse if below 7 Soft diet as tolerated Continue IV antibiotics for now, may not need a prolonged course.  Can possibly DC on discharge GI will continue to follow along   The patient was provided an opportunity to ask questions and all were answered. The patient agreed with the plan and demonstrated an understanding of the instructions.  LOIS Wilkie Mcgee , MD [phone]

## 2024-11-17 NOTE — ED Provider Notes (Signed)
 Patient presents to clinic over concern of rectal bleeding.  Had a few episodes close started around 9 to 10 PM yesterday.  Came from dialysis today.  Reports she had a large episode of rectal bleeding in the waiting room bathroom.  Did not see any stool.  Has not had any dizziness, lightheadedness, syncope or abdominal pain.  Was recently hospitalized for sepsis, colitis and septic shock, discharged on Monday.  Advised due to acute rectal bleeding recent hospitalization for colitis and septic shock the patient would benefit from further advanced evaluation at the nearest emergency department.  Stable for transport via POV.  [facility] headed to [name].    Dreama, Yanelle Sousa  N, FNP 11/17/24 1027

## 2024-11-17 NOTE — ED Notes (Signed)
 EDP made aware of contrast extravasation on left arm. Patient denies pain, some swelling, not reddened. IV removed. New iv PLACED.

## 2024-11-17 NOTE — H&P (Addendum)
 History and Physical    Patient: [name] FMW:[id number] DOB: January 22, 1951 DOA: 11/17/2024 DOS: the patient was seen and examined on 11/17/2024 PCP: Plotnikov, Karlynn GAILS, MD  Patient coming from: Via EMS  Chief Complaint:  Chief Complaint  Patient presents with   Rectal Bleeding   HPI: [name] is a 73 y.o. female with medical history significant of ESRD on HD TTS, hypertension, diabetes mellitus type 2, chronic pain, arthritis, and obesity presents with complaints of rectal bleeding  Patient had just recently been hospitalized with septic shock thought secondary to right sided colitis from 11/25-12/1 requiring admission into the ICU on pressors and antibiotics.  GI had been consulted but deferred colonoscopy GI.  Pathogen panel was negative.  Vascular surgery consulted but CT angiogram without focal lesion for intervention GI thought symptoms likely secondary to ischemic colitis for which patient had in the past.  Patient completed 7 days of Zosyn  and was noted to have loose stools prior to being discharged home.  she has experienced weakness but managed at home with her sister's assistance. She attended dialysis on Tuesday without issues, and her blood pressure remained stable during the session.  She had two episodes of diarrhea before leaving the hospital on Monday and two more after returning home. No bowel movements occurred until Friday, which was diarrhea but manageable. Last night, she experienced three bowel movements with bright red blood, and two more today.  She has had some mild epigastric abdominal discomfort, but less severe than prior to her previous hospitalization.    She has a history of similar gastrointestinal bleeding episodes, with this being the third occurrence.  She is currently taking midodrine  three times a day, including before and during dialysis sessions. She also takes a phosphorus blocker, which causes constipation. She has not used Miralax  since  returning home and has had a reduced appetite.  She lives alone following the passing of her mother in 2012. Her sister, who lives in [place], is her primary contact and support, especially during medical events.  No fever or chills. Reports abdominal tenderness, particularly across the top, but less severe than previous episodes. No significant swelling except for chronic left ankle swelling from prior injuries.  In the emergency department patient was noted to be hypotensive with blood pressures as low as 82/45 -127/54, respirations 18-22, pulse rate noted to be 33-93, and O2 saturations currently maintained on room air.  Labs significant for WBC 17.4, hemoglobin 10.6, potassium 3.1, BUN 12, creatinine 2.41, and anion gap 18.  Fecal occult testing was positive.   CT angiogram of the abdomen and pelvis reviewed no acute or active hemorrhage and persistent circumferential colonic wall thickening involving the ascending colon with associated inflammatory lymphadenopathy thought to be infectious, inflammatory or neoplastic in nature for which colonoscopy was recommended.   Review of Systems: As mentioned in the history of present illness. All other systems reviewed and are negative. Past Medical History:  Diagnosis Date   Anemia    Anxiety    Brain tumor (benign) ([name])    Colitis 2010   microscopic- Dr Abran   Depression    Diabetes mellitus    type II   Diverticulosis    Dyspnea    with exertion   ESRD (end stage renal disease) ([name])    TTUSAT [address]    Fibromyalgia    GERD (gastroesophageal reflux disease)    Headache    Hemodialysis patient    History of blood transfusion  after knee surgery   Hypertension    discontinued all diuretics and antihypertensives   IBS (irritable bowel syndrome)    LBP (low back pain)    Neuropathy    feet bilat    Osteoarthritis    Osteopenia    Pneumonia    hx of 2014    Rotator cuff tear, right    Sinusitis    currently being  treated with antibiotic will complete 03/04/2015   Past Surgical History:  Procedure Laterality Date   A/V FISTULAGRAM N/A 02/03/2024   Procedure: A/V Fistulagram;  Surgeon: Norine Manuelita LABOR, MD;  Location: [facility];  Service: Cardiovascular;  Laterality: N/A;   A/V SHUNT INTERVENTION Right 05/30/2024   Procedure: A/V SHUNT INTERVENTION;  Surgeon: Pearline Norman RAMAN, MD;  Location: [facility];  Service: Cardiovascular;  Laterality: Right;   A/V SHUNT INTERVENTION N/A 09/07/2024   Procedure: A/V SHUNT INTERVENTION;  Surgeon: Lanis Fonda BRAVO, MD;  Location: [facility];  Service: Cardiovascular;  Laterality: N/A;   AV FISTULA PLACEMENT Right 09/12/2020   Procedure: RIGHT ARTERIOVENOUS (AV) FISTULA CREATION;  Surgeon: Harvey Carlin BRAVO, MD;  Location: [facility] OR;  Service: Vascular;  Laterality: Right;   BASCILIC VEIN TRANSPOSITION Right 10/27/2020   Procedure: RIGHT UPPER EXTREMITY SECOND STAGE BASCILIC VEIN TRANSPOSITION;  Surgeon: Harvey Carlin BRAVO, MD;  Location: [facility] OR;  Service: Vascular;  Laterality: Right;   BIOPSY  03/11/2022   Procedure: BIOPSY;  Surgeon: Wilhelmenia Aloha Raddle., MD;  Location: [facility] ENDOSCOPY;  Service: Gastroenterology;;   COLONOSCOPY WITH PROPOFOL  N/A 03/11/2022   Procedure: COLONOSCOPY WITH PROPOFOL ;  Surgeon: Wilhelmenia Aloha Raddle., MD;  Location: [facility] ENDOSCOPY;  Service: Gastroenterology;  Laterality: N/A;   foramen magnum ependymoma surgery  2003   Dr Mora   JOINT REPLACEMENT Bilateral    NASAL SINUS SURGERY     1973    PERIPHERAL VASCULAR BALLOON ANGIOPLASTY  02/03/2024   Procedure: PERIPHERAL VASCULAR BALLOON ANGIOPLASTY;  Surgeon: Norine Manuelita LABOR, MD;  Location: [facility];  Service: Cardiovascular;;  Inflow Basilic Vein   REVERSE SHOULDER ARTHROPLASTY Right 06/03/2022   Procedure: REVERSE SHOULDER ARTHROPLASTY;  Surgeon: Melita Drivers, MD;  Location: [facility] ORS;  Service: Orthopedics;  Laterality: Right;    TONSILLECTOMY     TOTAL KNEE  ARTHROPLASTY     L 2008, R 2009, R 2016- Dr hiram   TOTAL KNEE REVISION Right 03/05/2015   Procedure: RIGHT TOTAL KNEE ARTHROPLASTY REVISION;  Surgeon: Dempsey Moan, MD;  Location: [facility] ORS;  Service: Orthopedics;  Laterality: Right;   VENOUS ANGIOPLASTY  05/30/2024   Procedure: VENOUS ANGIOPLASTY;  Surgeon: Pearline Norman RAMAN, MD;  Location: [facility];  Service: Cardiovascular;;  central   VENOUS ANGIOPLASTY  09/07/2024   Procedure: VENOUS ANGIOPLASTY;  Surgeon: Lanis Fonda BRAVO, MD;  Location: [facility];  Service: Cardiovascular;;  innominate 70%   Social History:  reports that she quit smoking about 35 years ago. Her smoking use included cigarettes. She started smoking about 55 years ago. She has a 20 pack-year smoking history. She has never used smokeless tobacco. She reports that she does not currently use alcohol . She reports that she does not use drugs.  Allergies  Allergen Reactions   Erythromycin  Diarrhea and Nausea And Vomiting   Gabapentin Other (See Comments)    Confusion and falling  Hallucinations    Family History  Problem Relation Age of Onset   Depression Mother    Hypertension Mother    Stroke Mother  81   Diabetes Mother    Diabetes Father    Hyperlipidemia Father    Hypertension Father    Crohn's disease Maternal Uncle    Diabetes Brother    Heart disease Brother    Hyperlipidemia Brother    Hypertension Brother    Prostate cancer Brother    Diabetes Brother    Heart disease Brother        Heart Disease before age 22   Heart attack Brother    Stroke Brother        X's 2   Hyperlipidemia Brother    Hypertension Brother    Diabetes Other    Colon cancer Neg Hx    Esophageal cancer Neg Hx    Stomach cancer Neg Hx    Pancreatic cancer Neg Hx    Breast cancer Neg Hx    BRCA 1/2 Neg Hx     Prior to Admission medications   Medication Sig Start Date End Date Taking? Authorizing Provider  Accu-Chek Softclix Lancets lancets Use 1 lancet up to 4 times daily  as directed 03/12/22   Samtani, Jai-Gurmukh, MD  acetaminophen  (TYLENOL ) 500 MG tablet Take 1,000 mg by mouth 2 (two) times daily as needed for headache.    [provider]  aspirin -acetaminophen -caffeine (EXCEDRIN MIGRAINE) 250-250-65 MG tablet Take 1 tablet by mouth daily as needed for headache or migraine. Max 3 doses in one week Patient not taking: Reported on 11/16/2024    [provider]  B Complex-C-Folic Acid  (RENA-VITE PO) Take 1 tablet by mouth every morning.    [provider]  buPROPion  (WELLBUTRIN  XL) 300 MG 24 hr tablet Take 1 tablet (300 mg total) by mouth in the morning. 10/19/24   Plotnikov, Aleksei V, MD  carboxymethylcellulose (REFRESH PLUS) 0.5 % SOLN Place 1 drop into both eyes 3 (three) times daily as needed (dry eyes/irritation).    [provider]  cinacalcet  (SENSIPAR ) 30 MG tablet Take 30 mg by mouth Every Tuesday,Thursday,and Saturday with dialysis.    [provider]  cyclobenzaprine  (FLEXERIL ) 10 MG tablet Take 1 tablet (10 mg total) by mouth 3 (three) times daily as needed for muscle spasms. 09/12/24   Plotnikov, Aleksei V, MD  diphenhydramine -acetaminophen  (TYLENOL  PM) 25-500 MG TABS tablet Take 2 tablets by mouth 3 (three) times a week. At bedtime Patient taking differently: Take 2 tablets by mouth 3 (three) times a week. At bedtime  - Takes as needed    [provider]  docusate sodium  (COLACE) 50 MG capsule Take 50 mg by mouth 2 (two) times daily.    [provider]  FLUoxetine  (PROZAC ) 40 MG capsule Take 1 capsule (40 mg total) by mouth in the morning. 08/26/23     fluticasone  (FLONASE ) 50 MCG/ACT nasal spray Place 2 sprays into both nostrils daily. Patient not taking: Reported on 11/16/2024 11/30/23   Soldatova, Liuba, MD  glimepiride  (AMARYL ) 1 MG tablet Take 1 tablet (1 mg total) by mouth daily with breakfast. 10/19/24   Plotnikov, Aleksei V, MD  ibuprofen (ADVIL) 200 MG tablet Take 200 mg by mouth every 6  (six) hours as needed for fever, headache, mild pain (pain score 1-3) or moderate pain (pain score 4-6).    [provider]  ketoconazole  (NIZORAL ) 2 % cream Apply 1 Application topically daily. 04/04/23   Plotnikov, Aleksei V, MD  lanthanum (FOSRENOL) 1000 MG chewable tablet Chew 1,000-2,000 mg by mouth See admin instructions. Taking 2000 mg with meals and 1000 mg  with  snacks    [provider]  LORazepam  (ATIVAN ) 2 MG tablet Take 1 tablet (2 mg total) by mouth 2 (two) times daily as needed for anxiety. 03/14/24     midodrine  (PROAMATINE ) 10 MG tablet Take 3 tablets (30 mg total) by mouth 3 (three) times a week. Take 30 minutes before dialysis and extra tablet during dialysis. Patient taking differently: Take 10 mg by mouth 3 (three) times a week. Taking 1 3x a day 11/21/23   Marlee Bernardino NOVAK, MD  Multiple Vitamins-Minerals (PRESERVISION AREDS 2) CAPS Take 1 capsule by mouth daily at 12 noon.    [provider]  naloxone  (NARCAN ) nasal spray 4 mg/0.1 mL Take by nasal route every 3 minutes until patient awakes or EMS arrives. Patient not taking: Reported on 11/16/2024 01/20/22     ondansetron  (ZOFRAN ) 4 MG tablet Take 1 tablet (4 mg total) by mouth every 8 (eight) hours as needed for nausea or vomiting. Patient not taking: Reported on 11/16/2024 06/03/22   Shuford, Randine, PA-C  oxyCODONE  (ROXICODONE ) 15 MG immediate release tablet Take 1 tablet (15 mg total) by mouth 4 (four) times daily as needed. 02/22/24     pantoprazole  (PROTONIX ) 40 MG tablet TAKE 1 TABLET BY MOUTH TWICE  DAILY 09/28/24   Abran Norleen SAILOR, MD  pilocarpine  (SALAGEN ) 5 MG tablet TAKE 1 TABLET BY MOUTH TWICE  DAILY 05/24/24   Plotnikov, Aleksei V, MD  pioglitazone  (ACTOS ) 15 MG tablet Take 1 tablet (15 mg total) by mouth daily. 10/19/24   Plotnikov, Aleksei V, MD  polyethylene glycol (MIRALAX  / GLYCOLAX ) 17 g packet Take 17 g by mouth daily as needed for moderate constipation.    [provider]  PREVIDENT 5000  DRY MOUTH 1.1 % GEL dental gel Place 1 application  onto teeth in the morning and at bedtime. 10/25/21   [provider]  REPATHA  SURECLICK 140 MG/ML SOAJ INJECT 1 PEN SUBCUTANEOUSLY  EVERY 2 WEEKS 10/24/24   Croitoru, Mihai, MD  Semaglutide ,0.25 or 0.5MG /DOS, (OZEMPIC , 0.25 OR 0.5 MG/DOSE,) 2 MG/3ML SOPN INJECT SUBCUTANEOUSLY 0.5 MG  EVERY WEEK 11/04/24   Plotnikov, Aleksei V, MD  sodium chloride  (OCEAN) 0.65 % SOLN nasal spray Place 1 spray into both nostrils as needed. Patient taking differently: Place 1 spray into both nostrils as needed for congestion. PRN 05/04/24   Okey Burns, MD    Physical Exam: Vitals:   11/17/24 1500 11/17/24 1515 11/17/24 1520 11/17/24 1525  BP: (!) 127/54 (!) 108/48 (!) 96/34 (!) 93/40  Pulse: (!) 33 72 69 73  Resp:      Temp:      TempSrc:      SpO2: 90% 95% 100% 96%  Weight:      Height:        Constitutional: Elderly female currently NAD, calm, comfortable Eyes: PERRL, lids and conjunctivae normal ENMT: Mucous membranes are dry posterior pharynx clear of any exudate or lesions.  Neck: normal, supple  Respiratory: clear to auscultation bilaterally, no wheezing, no crackles. Normal respiratory effort. No accessory muscle use.  Cardiovascular: Regular rate and rhythm, no murmurs / rubs / gallops. No extremity edema.  Upper extremity fistula in place..  Abdomen: Mild epigastric tenderness to palpation bowel sounds positive.  Musculoskeletal: no clubbing / cyanosis. No joint deformity upper and lower extremities. Good ROM, no contractures. Normal muscle tone.  Skin: no rashes, lesions, ulcers. No induration Neurologic: CN 2-12 grossly intact.   Strength 5/5 in all 4.  Psychiatric: Normal judgment and insight.  Alert and oriented x 3. Normal mood.   Data Reviewed:   Reviewed labs, imaging, and pertinent records as documented.   Assessment and Plan:  Colitis with rectal bleeding Patient presents with complaints of rectal bleeding and  some mild epigastric abdominal pain. Stool guaiacs were noted to be positive.  Hemoglobin 10.6.  He was recently hospitalized with similar symptoms for which symptoms were thought most likely related to colitis.  Fecal calprotectin during last hospitalization was elevated greater than 8000.  CT scan concerning for persistent circumferential colonic wall thickening involving the ascending colon with associated inflammation and lymphadenopathy.   Patient with similar history of the same.  Initially started on empiric antibiotics of Rocephin  and metronidazole .  GI consulted to evaluate and note concern that fluid restriction putting patient at risk for reoccurrence of symptoms.   - Admit to a progressive bed - Serial monitoring of H&H - Check CRP and ESR - Advised patient to discontinue ibuprofen and other NSAID use - No further antibiotics warranted at this time - Advancing diet as tolerated - Appreciate GI consultative services,  will follow-up for any further recommendations  Leukocytosis Acute.  White blood cell count elevated up to 17.4.  Thought secondary to above. - Recheck CBC tomorrow morning  Chronic hypotension Blood pressures initially noted to be as low as 82/45.  Patient reports being recently advised to take midodrine  10 mg 3 times daily. - Continue midodrine  3 times daily  Hypokalemia Acute.  Potassium noted mildly low at 3.1.  Possibly secondary to diarrhea and recent dialysis - Recheck potassium levels in a.m.  Anemia of chronic disease Hemoglobin noted to be 10.6 which appears around patient's most recent check on 12/5.  She received EPO with dialysis. - Continue to monitor H&H - Consider need of transfusion if hemoglobin drops less than 7-8 g/dL or patient clinically symptomatic  ESRD on HD Labs noted potassium 3.1, CO2 28, BUN 12, creatinine 2.41.  Patient was dialyzed this morning. - Nephrology notified of patient admission and recommendations in regards to more liberal  fluid intake  Diabetes mellitus type 2, without long-term use of insulin  Admission glucose noted to be 216.  Last available hemoglobin A1c noted to be 6.1 - Hypoglycemia protocols - CBGs before every meal with very sensitive SSI  Depression and anxiety - Continue Wellbutrin  and Prozac   Chronic pain Patient reports having significant arthritis pain which she is on oxycodone  and had been using ibuprofen intermittently for uncontrolled pain. - Continue oxycodone  - Counseled on need of cessation of NSAID use  DVT prophylaxis: SCDs  Advance Care Planning:   Code Status: Full Code   Consults: Gastroenterology, nephrology  Family Communication: None  Severity of Illness: The appropriate patient status for this patient is OBSERVATION. Observation status is judged to be reasonable and necessary in order to provide the required intensity of service to ensure the patient's safety. The patient's presenting symptoms, physical exam findings, and initial radiographic and laboratory data in the context of their medical condition is felt to place them at decreased risk for further clinical deterioration. Furthermore, it is anticipated that the patient will be medically stable for discharge from the hospital within 2 midnights of admission.   Author: Maximino DELENA Sharps, MD 11/17/2024 3:37 PM  For on call review [email or web address].

## 2024-11-17 NOTE — ED Provider Notes (Signed)
 [place] EMERGENCY DEPARTMENT AT [facility] Provider Note   CSN: [id number] Arrival date & time: 11/17/24  1034     Patient presents with: Rectal Bleeding   [name] is a 73 y.o. female.  {Add pertinent medical, surgical, social history, OB history to YEP:67052} Patient with history of ESRD on hemodialysis, diabetes, GERD, hypertension presents today with complaints of rectal bleeding. Reports that she had 1 bloody bowel movement last night and 2 today. Reports some generalized soreness in her upper abdomen but no significant pain. No nausea or vomiting, no fevers or chills. Reports that she was recently admitted for septic shock which was attributed to colitis, however she never had any bleeding during that admission. She did have a full session of dialysis this morning, and does report she sometimes has some abdominal discomfort after dialysis. She is not anticoagulated.  The history is provided by the patient. No language interpreter was used.  Rectal Bleeding      Prior to Admission medications   Medication Sig Start Date End Date Taking? Authorizing Provider  Accu-Chek Softclix Lancets lancets Use 1 lancet up to 4 times daily as directed 03/12/22   Samtani, Jai-Gurmukh, MD  acetaminophen  (TYLENOL ) 500 MG tablet Take 1,000 mg by mouth 2 (two) times daily as needed for headache.    [provider]  aspirin -acetaminophen -caffeine (EXCEDRIN MIGRAINE) 250-250-65 MG tablet Take 1 tablet by mouth daily as needed for headache or migraine. Max 3 doses in one week Patient not taking: Reported on 11/16/2024    [provider]  B Complex-C-Folic Acid  (RENA-VITE PO) Take 1 tablet by mouth every morning.    [provider]  buPROPion  (WELLBUTRIN  XL) 300 MG 24 hr tablet Take 1 tablet (300 mg total) by mouth in the morning. 10/19/24   Plotnikov, Aleksei V, MD  carboxymethylcellulose (REFRESH PLUS) 0.5 % SOLN Place 1 drop into both eyes 3 (three) times  daily as needed (dry eyes/irritation).    [provider]  cinacalcet  (SENSIPAR ) 30 MG tablet Take 30 mg by mouth Every Tuesday,Thursday,and Saturday with dialysis.    [provider]  cyclobenzaprine  (FLEXERIL ) 10 MG tablet Take 1 tablet (10 mg total) by mouth 3 (three) times daily as needed for muscle spasms. 09/12/24   Plotnikov, Aleksei V, MD  diphenhydramine -acetaminophen  (TYLENOL  PM) 25-500 MG TABS tablet Take 2 tablets by mouth 3 (three) times a week. At bedtime Patient taking differently: Take 2 tablets by mouth 3 (three) times a week. At bedtime  - Takes as needed    [provider]  docusate sodium  (COLACE) 50 MG capsule Take 50 mg by mouth 2 (two) times daily.    [provider]  FLUoxetine  (PROZAC ) 40 MG capsule Take 1 capsule (40 mg total) by mouth in the morning. 08/26/23     fluticasone  (FLONASE ) 50 MCG/ACT nasal spray Place 2 sprays into both nostrils daily. Patient not taking: Reported on 11/16/2024 11/30/23   Soldatova, Liuba, MD  glimepiride  (AMARYL ) 1 MG tablet Take 1 tablet (1 mg total) by mouth daily with breakfast. 10/19/24   Plotnikov, Aleksei V, MD  ibuprofen (ADVIL) 200 MG tablet Take 200 mg by mouth every 6 (six) hours as needed for fever, headache, mild pain (pain score 1-3) or moderate pain (pain score 4-6).    [provider]  ketoconazole  (NIZORAL ) 2 % cream Apply 1 Application topically daily. 04/04/23   Plotnikov, Aleksei V, MD  lanthanum (FOSRENOL) 1000 MG chewable tablet Chew 1,000-2,000 mg by mouth  See admin instructions. Taking 2000 mg with meals and 1000 mg  with snacks    [provider]  LORazepam  (ATIVAN ) 2 MG tablet Take 1 tablet (2 mg total) by mouth 2 (two) times daily as needed for anxiety. 03/14/24     midodrine  (PROAMATINE ) 10 MG tablet Take 3 tablets (30 mg total) by mouth 3 (three) times a week. Take 30 minutes before dialysis and extra tablet during dialysis. Patient taking differently: Take 10 mg by mouth  3 (three) times a week. Taking 1 3x a day 11/21/23   Marlee Bernardino NOVAK, MD  Multiple Vitamins-Minerals (PRESERVISION AREDS 2) CAPS Take 1 capsule by mouth daily at 12 noon.    [provider]  naloxone  (NARCAN ) nasal spray 4 mg/0.1 mL Take by nasal route every 3 minutes until patient awakes or EMS arrives. Patient not taking: Reported on 11/16/2024 01/20/22     ondansetron  (ZOFRAN ) 4 MG tablet Take 1 tablet (4 mg total) by mouth every 8 (eight) hours as needed for nausea or vomiting. Patient not taking: Reported on 11/16/2024 06/03/22   Shuford, Randine, PA-C  oxyCODONE  (ROXICODONE ) 15 MG immediate release tablet Take 1 tablet (15 mg total) by mouth 4 (four) times daily as needed. 02/22/24     pantoprazole  (PROTONIX ) 40 MG tablet TAKE 1 TABLET BY MOUTH TWICE  DAILY 09/28/24   Abran Norleen SAILOR, MD  pilocarpine  (SALAGEN ) 5 MG tablet TAKE 1 TABLET BY MOUTH TWICE  DAILY 05/24/24   Plotnikov, Aleksei V, MD  pioglitazone  (ACTOS ) 15 MG tablet Take 1 tablet (15 mg total) by mouth daily. 10/19/24   Plotnikov, Aleksei V, MD  polyethylene glycol (MIRALAX  / GLYCOLAX ) 17 g packet Take 17 g by mouth daily as needed for moderate constipation.    [provider]  PREVIDENT 5000 DRY MOUTH 1.1 % GEL dental gel Place 1 application  onto teeth in the morning and at bedtime. 10/25/21   [provider]  REPATHA  SURECLICK 140 MG/ML SOAJ INJECT 1 PEN SUBCUTANEOUSLY  EVERY 2 WEEKS 10/24/24   Croitoru, Mihai, MD  Semaglutide ,0.25 or 0.5MG /DOS, (OZEMPIC , 0.25 OR 0.5 MG/DOSE,) 2 MG/3ML SOPN INJECT SUBCUTANEOUSLY 0.5 MG  EVERY WEEK 11/04/24   Plotnikov, Aleksei V, MD  sodium chloride  (OCEAN) 0.65 % SOLN nasal spray Place 1 spray into both nostrils as needed. Patient taking differently: Place 1 spray into both nostrils as needed for congestion. PRN 05/04/24   Soldatova, Liuba, MD    Allergies: Erythromycin  and Gabapentin    Review of Systems  Gastrointestinal:  Positive for blood in stool and hematochezia.  All  other systems reviewed and are negative.   Updated Vital Signs BP (!) 96/47   Pulse 75   Temp 98 F (36.7 C)   Resp (!) 22   Ht 5' (1.524 m)   Wt 82 kg   SpO2 96%   BMI 35.31 kg/m   Physical Exam Vitals and nursing note reviewed. Exam conducted with a chaperone present.  Constitutional:      General: She is not in acute distress.    Appearance: Normal appearance. She is normal weight. She is not ill-appearing, toxic-appearing or diaphoretic.  HENT:     Head: Normocephalic and atraumatic.  Cardiovascular:     Rate and Rhythm: Normal rate.  Pulmonary:     Effort: Pulmonary effort is normal. No respiratory distress.  Abdominal:     General: Abdomen is flat.     Palpations: Abdomen is soft.     Tenderness: There is abdominal  tenderness. There is no guarding or rebound.     Comments: Generalized upper abdominal TTP without rebound or guarding  Genitourinary:    Comments: Bright red appearing bloody stool present on exam. No clots present Musculoskeletal:        General: Normal range of motion.     Cervical back: Normal range of motion.  Skin:    General: Skin is warm and dry.  Neurological:     General: No focal deficit present.     Mental Status: She is alert.  Psychiatric:        Mood and Affect: Mood normal.        Behavior: Behavior normal.     (all labs ordered are listed, but only abnormal results are displayed) Labs Reviewed  COMPREHENSIVE METABOLIC PANEL WITH GFR - Abnormal; Notable for the following components:      Result Value   Potassium 3.1 (*)    Chloride 90 (*)    Glucose, Bld 214 (*)    Creatinine, Ser 2.41 (*)    Calcium 8.6 (*)    GFR, Estimated 21 (*)    Anion gap 18 (*)    All other components within normal limits  CBC - Abnormal; Notable for the following components:   WBC 17.4 (*)    RBC 3.44 (*)    Hemoglobin 10.6 (*)    HCT 34.3 (*)    All other components within normal limits  HEMOGLOBIN AND HEMATOCRIT, BLOOD - Abnormal; Notable for  the following components:   Hemoglobin 9.4 (*)    HCT 29.1 (*)    All other components within normal limits  HEMOGLOBIN AND HEMATOCRIT, BLOOD - Abnormal; Notable for the following components:   Hemoglobin 9.6 (*)    HCT 30.3 (*)    All other components within normal limits  POC OCCULT BLOOD, ED - Abnormal; Notable for the following components:   Fecal Occult Bld POSITIVE (*)    All other components within normal limits  GLUCOSE, CAPILLARY  HEMOGLOBIN AND HEMATOCRIT, BLOOD  SEDIMENTATION RATE  C-REACTIVE PROTEIN  CBC  BASIC METABOLIC PANEL WITH GFR  TYPE AND SCREEN    EKG: None  Radiology: CT Angio Abd/Pel W and/or Wo Contrast Result Date: 11/17/2024 CLINICAL DATA:  Lower GI bleed.  Diarrhea with bright red blood. EXAM: CTA ABDOMEN AND PELVIS WITHOUT AND WITH CONTRAST TECHNIQUE: Multidetector CT imaging of the abdomen and pelvis was performed using the standard protocol during bolus administration of intravenous contrast. Multiplanar reconstructed images and MIPs were obtained and reviewed to evaluate the vascular anatomy. RADIATION DOSE REDUCTION: This exam was performed according to the departmental dose-optimization program which includes automated exposure control, adjustment of the mA and/or kV according to patient size and/or use of iterative reconstruction technique. CONTRAST:  OMNIPAQUE  IOHEXOL  350 MG/ML SOLN COMPARISON:  11/08/2024. FINDINGS: VASCULAR Aorta: Normal caliber aorta without aneurysm, dissection, vasculitis or significant stenosis. Aortic atherosclerosis. Celiac: Patent without evidence of aneurysm, dissection, vasculitis or significant stenosis. SMA: Patent without evidence of aneurysm, dissection, vasculitis or significant stenosis. Renals: Both renal arteries are patent without evidence of aneurysm or dissection. There is moderate renal artery stenosis on the right. The left renal artery is patent. IMA: Patent without evidence of aneurysm, dissection, vasculitis  or significant stenosis. Inflow: Patent without evidence of aneurysm, dissection, vasculitis or significant stenosis. Proximal Outflow: Bilateral common femoral and visualized portions of the superficial and profunda femoral arteries are patent without evidence of aneurysm, dissection, vasculitis or significant stenosis. Veins: No obvious  venous abnormality within the limitations of this arterial phase study. Review of the MIP images confirms the above findings. NON-VASCULAR Lower chest: There is a 4 mm ground-glass nodule in the right lower lobe, axial image 34. There is a 5 mm ground-glass nodule in the right lower lobe, axial image 51. Minimal atelectasis or scarring is noted at the lung bases. Hepatobiliary: No focal liver abnormality is seen. No gallstones, gallbladder wall thickening, or biliary dilatation. Pancreas: Unremarkable. No pancreatic ductal dilatation or surrounding inflammatory changes. Spleen: Normal in size without focal abnormality. Adrenals/Urinary Tract: The adrenal glands are within normal limits. Renal atrophy is noted bilaterally. There are multiple renal cysts bilaterally. No renal calculus or hydronephrosis. The bladder is unremarkable. Stomach/Bowel: The stomach is within normal limits. No bowel obstruction, free air, or pneumatosis is seen. Evaluation for hemorrhage is limited due to the presence of oral contrast. A few scattered diverticula are noted along the colon without evidence of diverticulitis. There is persistent circumferential wall thickening involving the ascending colon with mild associated fat stranding. Appendix is not seen. Lymphatic: Enlarged lymph nodes are noted in the right lower quadrant adjacent to the region of colonic wall thickening. Reproductive: Uterine fibroids are noted.  No adnexal mass is seen. Other: No abdominopelvic ascites. Large fat containing umbilical hernia is present. Musculoskeletal: Degenerative changes are noted in the thoracolumbar spine. No  acute osseous abnormality is identified. IMPRESSION: VASCULAR 1. No evidence of active hemorrhage, however evaluation of the bowel is limited due to the presence of enteric contrast. 2. Aortic atherosclerosis. NON-VASCULAR 1. No definite evidence of acute or active hemorrhage. Evaluation is limited due to the presence of oral contrast. 2. Persistent circumferential colonic wall thickening involving the ascending colon with associated inflammation and lymphadenopathy, which may be infectious, inflammatory, or neoplastic. Correlation with colonoscopy is recommended. 3. Diverticulosis without diverticulitis. 4. Right lower lobe ground-glass nodules measuring up to 5 mm. Follow-up by chest CT without contrast is recommended in 2 years, and again at 4 years to confirm stability. This recommendation follows the consensus statement: Recommendations for the Management of Subsolid Pulmonary Nodules Detected at CT: A Statement from the Fleischner Society as published in Radiology 2013; 266:304-317. 5. Remaining incidental findings as described above. Electronically Signed   By: Leita Birmingham M.D.   On: 11/17/2024 14:38    {Document cardiac monitor, telemetry assessment procedure when appropriate:32947} .Critical Care  Performed by: Nora Lauraine LABOR, PA-C Authorized by: Angeliz Settlemyre A, PA-C   Critical care provider statement:    Critical care time (minutes):  35   Critical care was necessary to treat or prevent imminent or life-threatening deterioration of the following conditions:  Circulatory failure   Critical care was time spent personally by me on the following activities:  Development of treatment plan with patient or surrogate, discussions with consultants, discussions with primary provider, evaluation of patient's response to treatment, examination of patient, obtaining history from patient or surrogate, ordering and review of laboratory studies, ordering and review of radiographic studies, pulse oximetry,  re-evaluation of patient's condition and review of old charts   Care discussed with: admitting provider      Medications Ordered in the ED - No data to display    {Click here for ABCD2, HEART and other calculators REFRESH Note before signing:1}                              Medical Decision Making Amount and/or Complexity of  Data Reviewed Labs: ordered. Radiology: ordered.  Risk Prescription drug management. Decision regarding hospitalization.   This patient is a 73 y.o. female who presents to the ED for concern of rectal bleeding, this involves an extensive number of treatment options, and is a complaint that carries with it a high risk of complications and morbidity. The emergent differential diagnosis prior to evaluation includes, but is not limited to,  Diverticulitis, IBD, colitis, mesenteric ischemia, colorectal cancer / polyps, hemorrhoids, rectal foreign body, anal fissure  This is not an exhaustive differential.   Past Medical History / Co-morbidities / Social History: history of ESRD on hemodialysis, diabetes, GERD, hypertension  Additional history: Chart reviewed. Pertinent results include: recent ICU admission from 11/25 - 12/01 due to septic shock attributed to ischemic colitis. Does look like she also has chronic hypotension particularly with dialysis and does require midodrine   Physical Exam: Physical exam performed. The pertinent findings include: Generalized upper abdominal TTP without rebound or guarding. Bright red appearing bloody stool present on exam. No clots present  Lab Tests: I ordered, and personally interpreted labs.  The pertinent results include:  WBC 17.4, hgb 10.6 --> 9.6 (was 10.6 yesterday), K 3.1, kidney function at baseline, glucose 214   Imaging Studies: I ordered imaging studies including CTA abdomen pelvis. I independently visualized and interpreted imaging which showed ***. I agree with the radiologist interpretation.   Cardiac Monitoring:   The patient was maintained on a cardiac monitor.  My attending physician Dr. PIERRETTE viewed and interpreted the cardiac monitored which showed an underlying rhythm of: ***. I agree with this interpretation.   Medications: I ordered medication including ***  for ***. Reevaluation of the patient after these medicines showed that the patient {resolved/improved/worsened:23923::improved}. I have reviewed the patients home medicines and have made adjustments as needed.  Consultations Obtained: I requested consultation with the ***,  and discussed lab and imaging findings as well as pertinent plan - they recommend: ***   Disposition: After consideration of the diagnostic results and the patients response to treatment, I feel that *** .   ***emergency department workup does not suggest an emergent condition requiring admission or immediate intervention beyond what has been performed at this time. The plan is: ***. The patient is safe for discharge and has been instructed to return immediately for worsening symptoms, change in symptoms or any other concerns.  I discussed this case with my attending physician Dr. PIERRETTE who cosigned this note including patient's presenting symptoms, physical exam, and planned diagnostics and interventions. Attending physician stated agreement with plan or made changes to plan which were implemented.     {Document critical care time when appropriate  Document review of labs and clinical decision tools ie CHADS2VASC2, etc  Document your independent review of radiology images and any outside records  Document your discussion with family members, caretakers and with consultants  Document social determinants of health affecting pt's care  Document your decision making why or why not admission, treatments were needed:32947:::1}   Final diagnoses:  None    ED Discharge Orders     None

## 2024-11-17 NOTE — Plan of Care (Signed)

## 2024-11-17 NOTE — Consult Note (Signed)
 [facility] Renal Consultation Note  Requesting MD: Maximino Sharps, MD Indication for Consultation:  ESRD  Chief complaint: Bloody stool and abdominal pain  HPI:  [name] is a 73 y.o. female with a history of end-stage renal disease on hemodialysis, diverticulosis, and hypertension who presented to the hospital with rectal bleeding (she describes bright red blood).  She also had abdominal tenderness to palpation but not overt abdominal pain.  Presentation was concerning for recurrent ischemic colitis. She had a recent admission with leukocytosis abdominal pain and was diagnosed with ischemic colitis but never had any bleeding at that time.  This is the first time she's had bleeding.  She had dialysis on Saturday morning she stated that the bleeding had occurred after leaving dialysis.  Dialysis is complicated by hypotension and she is on midodrine .  Her team requests consideration of liberated fluid intake to decrease the risk of the episodes given that this is her second episode of ischemic colitis in December.  She states that at dialysis rather than take her out of UF that she is wearing an abdominal binder and lying back. She states she is never short of breath or swollen.  She states when she was told about not having a fluid restriction that she got worried that it would make things worse because the more fluid she has off, the harder dialysis is on her; I shook her hand.  She is a former engineer, civil (consulting).  She has only been on midodrine  on dialysis days and she states that she was recently told to increase the midodrine  to 10 mg TID but hadn't started this yet because she was just told this.  She states that she is on ozempic  and has lost weight with this - her dry weight has been in flux. She estimates losing around 80 lbs.    PMHx:   Past Medical History:  Diagnosis Date   Anemia    Anxiety    Brain tumor (benign) ([name])    Colitis 2010   microscopic- Dr Abran   Depression     Diabetes mellitus    type II   Diverticulosis    Dyspnea    with exertion   ESRD (end stage renal disease) ([name])    TTUSAT [address]    Fibromyalgia    GERD (gastroesophageal reflux disease)    Headache    Hemodialysis patient    History of blood transfusion    after knee surgery   Hypertension    discontinued all diuretics and antihypertensives   IBS (irritable bowel syndrome)    LBP (low back pain)    Neuropathy    feet bilat    Osteoarthritis    Osteopenia    Pneumonia    hx of 2014    Rotator cuff tear, right    Sinusitis    currently being treated with antibiotic will complete 03/04/2015    Past Surgical History:  Procedure Laterality Date   A/V FISTULAGRAM N/A 02/03/2024   Procedure: A/V Fistulagram;  Surgeon: Norine Manuelita LABOR, MD;  Location: [facility];  Service: Cardiovascular;  Laterality: N/A;   A/V SHUNT INTERVENTION Right 05/30/2024   Procedure: A/V SHUNT INTERVENTION;  Surgeon: Pearline Norman RAMAN, MD;  Location: [facility] LAB;  Service: Cardiovascular;  Laterality: Right;   A/V SHUNT INTERVENTION N/A 09/07/2024   Procedure: A/V SHUNT INTERVENTION;  Surgeon: Lanis Fonda BRAVO, MD;  Location: [facility];  Service: Cardiovascular;  Laterality: N/A;   AV FISTULA PLACEMENT  Right 09/12/2020   Procedure: RIGHT ARTERIOVENOUS (AV) FISTULA CREATION;  Surgeon: Harvey Carlin BRAVO, MD;  Location[facility] OR;  Service: Vascular;  Laterality: Right;   BASCILIC VEIN TRANSPOSITION Right 10/27/2020   Procedure: RIGHT UPPER EXTREMITY SECOND STAGE BASCILIC VEIN TRANSPOSITION;  Surgeon: Harvey Carlin BRAVO, MD;  Location: [facility] OR;  Service: Vascular;  Laterality: Right;   BIOPSY  03/11/2022   Procedure: BIOPSY;  Surgeon: Wilhelmenia Aloha Raddle., MD;  Location: [facility] ENDOSCOPY;  Service: Gastroenterology;;   COLONOSCOPY WITH PROPOFOL  N/A 03/11/2022   Procedure: COLONOSCOPY WITH PROPOFOL ;  Surgeon: Wilhelmenia Aloha Raddle., MD;  Location: [facility] ENDOSCOPY;  Service: Gastroenterology;  Laterality: N/A;    foramen magnum ependymoma surgery  2003   Dr Mora   JOINT REPLACEMENT Bilateral    NASAL SINUS SURGERY     1973    PERIPHERAL VASCULAR BALLOON ANGIOPLASTY  02/03/2024   Procedure: PERIPHERAL VASCULAR BALLOON ANGIOPLASTY;  Surgeon: Norine Manuelita LABOR, MD;  Location: [facility];  Service: Cardiovascular;;  Inflow Basilic Vein   REVERSE SHOULDER ARTHROPLASTY Right 06/03/2022   Procedure: REVERSE SHOULDER ARTHROPLASTY;  Surgeon: Melita Drivers, MD;  Location: [facility] ORS;  Service: Orthopedics;  Laterality: Right;    TONSILLECTOMY     TOTAL KNEE ARTHROPLASTY     L 2008, R 2009, R 2016- Dr hiram   TOTAL KNEE REVISION Right 03/05/2015   Procedure: RIGHT TOTAL KNEE ARTHROPLASTY REVISION;  Surgeon: Dempsey Moan, MD;  Location: [facility] ORS;  Service: Orthopedics;  Laterality: Right;   VENOUS ANGIOPLASTY  05/30/2024   Procedure: VENOUS ANGIOPLASTY;  Surgeon: Pearline Norman RAMAN, MD;  Location: [facility];  Service: Cardiovascular;;  central   VENOUS ANGIOPLASTY  09/07/2024   Procedure: VENOUS ANGIOPLASTY;  Surgeon: Lanis Fonda BRAVO, MD;  Location: [facility];  Service: Cardiovascular;;  innominate 70%    Family Hx:  Family History  Problem Relation Age of Onset   Depression Mother    Hypertension Mother    Stroke Mother 37   Diabetes Mother    Diabetes Father    Hyperlipidemia Father    Hypertension Father    Crohn's disease Maternal Uncle    Diabetes Brother    Heart disease Brother    Hyperlipidemia Brother    Hypertension Brother    Prostate cancer Brother    Diabetes Brother    Heart disease Brother        Heart Disease before age 61   Heart attack Brother    Stroke Brother        X's 2   Hyperlipidemia Brother    Hypertension Brother    Diabetes Other    Colon cancer Neg Hx    Esophageal cancer Neg Hx    Stomach cancer Neg Hx    Pancreatic cancer Neg Hx    Breast cancer Neg Hx    BRCA 1/2 Neg Hx     Social History:  reports that she quit smoking about 35 years ago.  Her smoking use included cigarettes. She started smoking about 55 years ago. She has a 20 pack-year smoking history. She has never used smokeless tobacco. She reports that she does not currently use alcohol . She reports that she does not use drugs.  Allergies:  Allergies  Allergen Reactions   Erythromycin  Diarrhea and Nausea And Vomiting   Gabapentin Other (See Comments)    Confusion and falling  Hallucinations    Medications: Prior to Admission medications   Medication Sig Start Date End Date Taking? Authorizing Provider  Accu-Chek Softclix Lancets lancets Use 1 lancet up to 4 times daily as directed 03/12/22   Samtani, Jai-Gurmukh, MD  acetaminophen  (TYLENOL ) 500 MG tablet Take 1,000 mg by mouth 2 (two) times daily as needed for headache.    [provider]  aspirin -acetaminophen -caffeine (EXCEDRIN MIGRAINE) 250-250-65 MG tablet Take 1 tablet by mouth daily as needed for headache or migraine. Max 3 doses in one week Patient not taking: Reported on 11/16/2024    [provider]  B Complex-C-Folic Acid  (RENA-VITE PO) Take 1 tablet by mouth every morning.    [provider]  buPROPion  (WELLBUTRIN  XL) 300 MG 24 hr tablet Take 1 tablet (300 mg total) by mouth in the morning. 10/19/24   Plotnikov, Aleksei V, MD  carboxymethylcellulose (REFRESH PLUS) 0.5 % SOLN Place 1 drop into both eyes 3 (three) times daily as needed (dry eyes/irritation).    [provider]  cinacalcet  (SENSIPAR ) 30 MG tablet Take 30 mg by mouth Every Tuesday,Thursday,and Saturday with dialysis.    [provider]  cyclobenzaprine  (FLEXERIL ) 10 MG tablet Take 1 tablet (10 mg total) by mouth 3 (three) times daily as needed for muscle spasms. 09/12/24   Plotnikov, Aleksei V, MD  diphenhydramine -acetaminophen  (TYLENOL  PM) 25-500 MG TABS tablet Take 2 tablets by mouth 3 (three) times a week. At bedtime Patient taking differently: Take 2 tablets by mouth 3 (three) times a week. At bedtime  -  Takes as needed    [provider]  docusate sodium  (COLACE) 50 MG capsule Take 50 mg by mouth 2 (two) times daily.    [provider]  FLUoxetine  (PROZAC ) 40 MG capsule Take 1 capsule (40 mg total) by mouth in the morning. 08/26/23     fluticasone  (FLONASE ) 50 MCG/ACT nasal spray Place 2 sprays into both nostrils daily. Patient not taking: Reported on 11/16/2024 11/30/23   Soldatova, Liuba, MD  glimepiride  (AMARYL ) 1 MG tablet Take 1 tablet (1 mg total) by mouth daily with breakfast. 10/19/24   Plotnikov, Karlynn GAILS, MD  ketoconazole  (NIZORAL ) 2 % cream Apply 1 Application topically daily. 04/04/23   Plotnikov, Aleksei V, MD  lanthanum (FOSRENOL) 1000 MG chewable tablet Chew 1,000-2,000 mg by mouth See admin instructions. Taking 2000 mg with meals and 1000 mg  with snacks    [provider]  LORazepam  (ATIVAN ) 2 MG tablet Take 1 tablet (2 mg total) by mouth 2 (two) times daily as needed for anxiety. 03/14/24     midodrine  (PROAMATINE ) 10 MG tablet Take 3 tablets (30 mg total) by mouth 3 (three) times a week. Take 30 minutes before dialysis and extra tablet during dialysis. Patient taking differently: Take 10 mg by mouth 3 (three) times a week. Taking 1 3x a day 11/21/23   Marlee Bernardino NOVAK, MD  Multiple Vitamins-Minerals (PRESERVISION AREDS 2) CAPS Take 1 capsule by mouth daily at 12 noon.    [provider]  naloxone  (NARCAN ) nasal spray 4 mg/0.1 mL Take by nasal route every 3 minutes until patient awakes or EMS arrives. Patient not taking: Reported on 11/16/2024 01/20/22     ondansetron  (ZOFRAN ) 4 MG tablet Take 1 tablet (4 mg total) by mouth every 8 (eight) hours as needed for nausea or vomiting. Patient not taking: Reported on 11/16/2024 06/03/22   Shuford, Randine, PA-C  oxyCODONE  (ROXICODONE ) 15 MG immediate release tablet Take 1 tablet (15 mg total) by mouth 4 (four) times daily as needed. 02/22/24     pantoprazole  (PROTONIX ) 40 MG tablet TAKE 1  TABLET BY MOUTH TWICE  DAILY  09/28/24   Abran Norleen SAILOR, MD  pilocarpine  (SALAGEN ) 5 MG tablet TAKE 1 TABLET BY MOUTH TWICE  DAILY 05/24/24   Plotnikov, Aleksei V, MD  pioglitazone  (ACTOS ) 15 MG tablet Take 1 tablet (15 mg total) by mouth daily. 10/19/24   Plotnikov, Aleksei V, MD  polyethylene glycol (MIRALAX  / GLYCOLAX ) 17 g packet Take 17 g by mouth daily as needed for moderate constipation.    [provider]  PREVIDENT 5000 DRY MOUTH 1.1 % GEL dental gel Place 1 application  onto teeth in the morning and at bedtime. 10/25/21   [provider]  REPATHA  SURECLICK 140 MG/ML SOAJ INJECT 1 PEN SUBCUTANEOUSLY  EVERY 2 WEEKS 10/24/24   Croitoru, Mihai, MD  Semaglutide ,0.25 or 0.5MG /DOS, (OZEMPIC , 0.25 OR 0.5 MG/DOSE,) 2 MG/3ML SOPN INJECT SUBCUTANEOUSLY 0.5 MG  EVERY WEEK 11/04/24   Plotnikov, Aleksei V, MD  sodium chloride  (OCEAN) 0.65 % SOLN nasal spray Place 1 spray into both nostrils as needed. Patient taking differently: Place 1 spray into both nostrils as needed for congestion. PRN 05/04/24   Soldatova, Liuba, MD    I have reviewed the patient's current and reported prior to admission medications.  Labs:     Latest Ref Rng & Units 11/17/2024   10:47 AM 11/16/2024    4:03 PM 11/11/2024    3:42 AM  BMP  Glucose 70 - 99 mg/dL 785  852  892   BUN 8 - 23 mg/dL 12  35  43   Creatinine 0.44 - 1.00 mg/dL 7.58  5.27  4.13   Sodium 135 - 145 mmol/L 136  137  129   Potassium 3.5 - 5.1 mmol/L 3.1  3.4  3.7   Chloride 98 - 111 mmol/L 90  90  91   CO2 22 - 32 mmol/L 28  29  20    Calcium 8.9 - 10.3 mg/dL 8.6  89.8  8.6     Urinalysis    Component Value Date/Time   COLORURINE YELLOW 08/05/2021 2020   APPEARANCEUR CLOUDY (A) 08/05/2021 2020   LABSPEC 1.020 08/05/2021 2020   PHURINE 5.0 08/05/2021 2020   GLUCOSEU NEGATIVE 08/05/2021 2020   GLUCOSEU NEGATIVE 08/05/2021 1557   HGBUR NEGATIVE 08/05/2021 2020   BILIRUBINUR NEGATIVE 08/05/2021 2020   KETONESUR NEGATIVE 08/05/2021 2020   PROTEINUR 30 (A)  08/05/2021 2020   UROBILINOGEN 0.2 08/05/2021 1557   NITRITE NEGATIVE 08/05/2021 2020   LEUKOCYTESUR SMALL (A) 08/05/2021 2020     ROS:  Pertinent items noted in HPI and remainder of comprehensive ROS otherwise negative.  Physical Exam: Vitals:   11/17/24 1525 11/17/24 1659  BP: (!) 93/40 (!) 129/41  Pulse: 73   Resp:  17  Temp:  97.9 F (36.6 C)  SpO2: 96%      General: elderly female in bed in NAD  HEENT: NCAT Eyes: EOMI sclera anicteric Neck: supple trachea midline Heart: S1S2 no rub Lungs: clear to auscultation bilaterally; normal work of breathing at rest  Abdomen: soft/nt/obese habitus Extremities: no edema; no cyanosis or clubbing Skin: no rash on extremities exposed Psych: normal mood and affect Neuro: alert and oriented x 3 provides hx and follows commands Access RUE AVF with bruit and thrill   Outpatient HD orders:  [facility] TTS 400 BF Auto 1.5 for DF 2K/2ca EDW 82.5 kg Last post weight 82.0 kg on 12/6 AVF Mircera 75 mcg every 2 weeks (last given on 12/4) Calcitriol  2.75 mcg three times a week  with HD Sensipar  30 mg three times a week with HD Iron sucrose  50 mg weekly   Assessment/Plan:  # Ischemic colitis, recurrent - Reducing the patient's fluid intake would limit blood pressure drops with dialysis (because there would be less fluid to take off).  Therefore, would not advise liberalizing her fluid restrictions but rather raising her dry weight.    # ESRD - HD per TTS schedule - no acute indication for HD here - she had it today outpatient  - as above, would not recommend liberalizing her fluid restrictions - Would try to increase EDW by 0.5 kg to 83 kg  - consider a max UF goal but it seems like she has just needed a dry weight increase  # Hypokalemia - these are post-HD labs - trend AM labs and consider added K bath   # Chronic hypotension - on midodrine  TID  - continue   # Anemia of CKD - As well as acute blood loss  - note she just  received mircera on 12/4 - ESA not yet due again  # Metabolic bone disease  - resume calcitriol   - resume sensipar  - note that this medication is actually given at dialysis and should not appear on the patient's discharge summary to avoid duplicate dosing at home.     Thank you for the consult.  Please do not hesitate to contact me with any questions regarding our patient  [name] 11/17/2024, 9:11 PM

## 2024-11-17 NOTE — ED Triage Notes (Signed)
 Pt states bright red blood since yesterday. Pt states completed dialysis treatment today. Denies abd pian, nausea, vomiting/ dizziness. Axox4.

## 2024-11-17 NOTE — ED Notes (Signed)
 Patient is being discharged from the Urgent Care and sent to the Emergency Department via private vehicle . Per JUDITHANN Nose, NP, patient is in need of higher level of care due to lower GI bleeding. Patient is aware and verbalizes understanding of plan of care.  Vitals:   11/17/24 1017  BP: 115/61  Pulse: 89  Resp: 20  Temp: 97.7 F (36.5 C)  SpO2: 100%

## 2024-11-17 NOTE — Telephone Encounter (Signed)
 Patient called answering service this evening. Returned call this evening. Patient recently admitted to the hospital with what appears to be an episode of ischemic colitis. She states she had been doing okay, but the phosphate binder had constipated her. She had a bowel movement earlier today that was brown admixed with red blood. She denies any pain time. I have recommended that she monitor her symptoms at this time. If things progress or worsen she will need to come into the hospital for evaluation. She will update us  if something develops over the course of the weekend. Otherwise we will have Dr. Nancyann team reach out to the patient on Monday.   Aloha Finner, MD [facility] Office # [phone]

## 2024-11-18 DIAGNOSIS — K625 Hemorrhage of anus and rectum: Secondary | ICD-10-CM

## 2024-11-18 DIAGNOSIS — Z79899 Other long term (current) drug therapy: Secondary | ICD-10-CM | POA: Diagnosis not present

## 2024-11-18 DIAGNOSIS — K529 Noninfective gastroenteritis and colitis, unspecified: Secondary | ICD-10-CM

## 2024-11-18 DIAGNOSIS — G8929 Other chronic pain: Secondary | ICD-10-CM | POA: Diagnosis present

## 2024-11-18 DIAGNOSIS — Z87891 Personal history of nicotine dependence: Secondary | ICD-10-CM | POA: Diagnosis not present

## 2024-11-18 DIAGNOSIS — K55039 Acute (reversible) ischemia of large intestine, extent unspecified: Secondary | ICD-10-CM | POA: Diagnosis present

## 2024-11-18 DIAGNOSIS — E876 Hypokalemia: Secondary | ICD-10-CM | POA: Diagnosis present

## 2024-11-18 DIAGNOSIS — K559 Vascular disorder of intestine, unspecified: Secondary | ICD-10-CM | POA: Diagnosis not present

## 2024-11-18 DIAGNOSIS — Z992 Dependence on renal dialysis: Secondary | ICD-10-CM | POA: Diagnosis not present

## 2024-11-18 DIAGNOSIS — I120 Hypertensive chronic kidney disease with stage 5 chronic kidney disease or end stage renal disease: Secondary | ICD-10-CM | POA: Diagnosis present

## 2024-11-18 DIAGNOSIS — Z833 Family history of diabetes mellitus: Secondary | ICD-10-CM | POA: Diagnosis not present

## 2024-11-18 DIAGNOSIS — K921 Melena: Principal | ICD-10-CM | POA: Diagnosis present

## 2024-11-18 DIAGNOSIS — D631 Anemia in chronic kidney disease: Secondary | ICD-10-CM | POA: Diagnosis present

## 2024-11-18 DIAGNOSIS — K5909 Other constipation: Secondary | ICD-10-CM | POA: Diagnosis not present

## 2024-11-18 DIAGNOSIS — F32A Depression, unspecified: Secondary | ICD-10-CM | POA: Diagnosis present

## 2024-11-18 DIAGNOSIS — D72829 Elevated white blood cell count, unspecified: Secondary | ICD-10-CM | POA: Diagnosis present

## 2024-11-18 DIAGNOSIS — N186 End stage renal disease: Secondary | ICD-10-CM | POA: Diagnosis present

## 2024-11-18 DIAGNOSIS — D649 Anemia, unspecified: Secondary | ICD-10-CM | POA: Diagnosis not present

## 2024-11-18 DIAGNOSIS — Z8249 Family history of ischemic heart disease and other diseases of the circulatory system: Secondary | ICD-10-CM | POA: Diagnosis not present

## 2024-11-18 DIAGNOSIS — M797 Fibromyalgia: Secondary | ICD-10-CM | POA: Diagnosis present

## 2024-11-18 DIAGNOSIS — Z7985 Long-term (current) use of injectable non-insulin antidiabetic drugs: Secondary | ICD-10-CM | POA: Diagnosis not present

## 2024-11-18 DIAGNOSIS — K5731 Diverticulosis of large intestine without perforation or abscess with bleeding: Secondary | ICD-10-CM | POA: Diagnosis present

## 2024-11-18 DIAGNOSIS — E1122 Type 2 diabetes mellitus with diabetic chronic kidney disease: Secondary | ICD-10-CM | POA: Diagnosis present

## 2024-11-18 DIAGNOSIS — Z6834 Body mass index (BMI) 34.0-34.9, adult: Secondary | ICD-10-CM | POA: Diagnosis not present

## 2024-11-18 DIAGNOSIS — I953 Hypotension of hemodialysis: Secondary | ICD-10-CM | POA: Diagnosis present

## 2024-11-18 DIAGNOSIS — E1142 Type 2 diabetes mellitus with diabetic polyneuropathy: Secondary | ICD-10-CM | POA: Diagnosis present

## 2024-11-18 DIAGNOSIS — Z7982 Long term (current) use of aspirin: Secondary | ICD-10-CM | POA: Diagnosis not present

## 2024-11-18 DIAGNOSIS — N2581 Secondary hyperparathyroidism of renal origin: Secondary | ICD-10-CM | POA: Diagnosis present

## 2024-11-18 DIAGNOSIS — Z7984 Long term (current) use of oral hypoglycemic drugs: Secondary | ICD-10-CM | POA: Diagnosis not present

## 2024-11-18 LAB — CBC WITH DIFFERENTIAL/PLATELET
Abs Immature Granulocytes: 0.09 K/uL — ABNORMAL HIGH (ref 0.00–0.07)
Basophils Absolute: 0.1 K/uL (ref 0.0–0.1)
Basophils Relative: 1 %
Eosinophils Absolute: 0.3 K/uL (ref 0.0–0.5)
Eosinophils Relative: 2 %
HCT: 26.8 % — ABNORMAL LOW (ref 36.0–46.0)
Hemoglobin: 8.6 g/dL — ABNORMAL LOW (ref 12.0–15.0)
Immature Granulocytes: 1 %
Lymphocytes Relative: 22 %
Lymphs Abs: 2.8 K/uL (ref 0.7–4.0)
MCH: 31.6 pg (ref 26.0–34.0)
MCHC: 32.1 g/dL (ref 30.0–36.0)
MCV: 98.5 fL (ref 80.0–100.0)
Monocytes Absolute: 0.9 K/uL (ref 0.1–1.0)
Monocytes Relative: 7 %
Neutro Abs: 8.8 K/uL — ABNORMAL HIGH (ref 1.7–7.7)
Neutrophils Relative %: 67 %
Platelets: 276 K/uL (ref 150–400)
RBC: 2.72 MIL/uL — ABNORMAL LOW (ref 3.87–5.11)
RDW: 14.6 % (ref 11.5–15.5)
WBC: 12.9 K/uL — ABNORMAL HIGH (ref 4.0–10.5)
nRBC: 0.3 % — ABNORMAL HIGH (ref 0.0–0.2)

## 2024-11-18 LAB — CBC
HCT: 27.9 % — ABNORMAL LOW (ref 36.0–46.0)
Hemoglobin: 8.8 g/dL — ABNORMAL LOW (ref 12.0–15.0)
MCH: 31.3 pg (ref 26.0–34.0)
MCHC: 31.5 g/dL (ref 30.0–36.0)
MCV: 99.3 fL (ref 80.0–100.0)
Platelets: 275 K/uL (ref 150–400)
RBC: 2.81 MIL/uL — ABNORMAL LOW (ref 3.87–5.11)
RDW: 14.7 % (ref 11.5–15.5)
WBC: 12.8 K/uL — ABNORMAL HIGH (ref 4.0–10.5)
nRBC: 0.2 % (ref 0.0–0.2)

## 2024-11-18 LAB — GLUCOSE, CAPILLARY
Glucose-Capillary: 90 mg/dL (ref 70–99)
Glucose-Capillary: 160 mg/dL — ABNORMAL HIGH (ref 70–99)
Glucose-Capillary: 73 mg/dL (ref 70–99)
Glucose-Capillary: 171 mg/dL — ABNORMAL HIGH (ref 70–99)

## 2024-11-18 LAB — BASIC METABOLIC PANEL WITH GFR
Anion gap: 14 (ref 5–15)
BUN: 18 mg/dL (ref 8–23)
CO2: 26 mmol/L (ref 22–32)
Calcium: 8.4 mg/dL — ABNORMAL LOW (ref 8.9–10.3)
Chloride: 97 mmol/L — ABNORMAL LOW (ref 98–111)
Creatinine, Ser: 4 mg/dL — ABNORMAL HIGH (ref 0.44–1.00)
GFR, Estimated: 11 mL/min — ABNORMAL LOW (ref >60)
Glucose, Bld: 152 mg/dL — ABNORMAL HIGH (ref 70–99)
Potassium: 3.3 mmol/L — ABNORMAL LOW (ref 3.5–5.1)
Sodium: 137 mmol/L (ref 135–145)

## 2024-11-18 MED ORDER — SODIUM CHLORIDE 0.9 % IV SOLN
2.0000 g | INTRAVENOUS | Status: DC
  Administered 2024-11-18: 2 g via INTRAVENOUS
  Filled 2024-11-18: qty 20

## 2024-11-18 MED ORDER — LORAZEPAM 1 MG PO TABS
2.0000 mg | ORAL_TABLET | Freq: Once | ORAL | Status: AC
  Administered 2024-11-18: 2 mg via ORAL
  Filled 2024-11-18: qty 2

## 2024-11-18 MED ORDER — DICLOFENAC SODIUM 1 % EX GEL
2.0000 g | Freq: Four times a day (QID) | CUTANEOUS | Status: DC
  Administered 2024-11-18 – 2024-11-20 (×11): 2 g via TOPICAL
  Filled 2024-11-18: qty 100

## 2024-11-18 NOTE — Plan of Care (Signed)

## 2024-11-18 NOTE — Progress Notes (Addendum)
 Patient ID: [name], female   DOB: Apr 23, 1951, 73 y.o.   MRN: [id number]    Progress Note   Subjective   Day # 2 CC; rectal bleeding, abdominal pain, just discharged 6 days previous after admission with severe ischemic colitis  Dialysis yesterday prior to admission  IV ceftriaxone   CTA yesterday-no active bleeding-few scattered diverticuli along the colon without evidence of diverticulitis, persistent circumferential wall thickening involving the ascending colon with mild associated fat stranding, enlarged lymph nodes noted in the right lower quadrant adjacent to the area of wall thickening  Today-WBC 12.8 improved Hemoglobin 8.8/hematocrit 27.9 down from hemoglobin 10.6 on admission BMET pending   Objective   Vital signs in last 24 hours: Temp:  [97.7 F (36.5 C)-98.4 F (36.9 C)] 97.9 F (36.6 C) (12/07 0723) Pulse Rate:  [33-93] 70 (12/07 0723) Resp:  [17-22] 18 (12/07 0723) BP: (82-133)/(34-83) 112/44 (12/07 0723) SpO2:  [90 %-100 %] 92 % (12/07 0723) Weight:  [81.4 kg-82 kg] 81.4 kg (12/06 1657) Last BM Date : 11/17/24 General:  older  white female in NAD Heart:  Regular rate and rhythm; no murmurs Lungs: Respirations even and unlabored, lungs CTA bilaterally Abdomen:  Soft, minimally tender in the right lower quadrant, no guarding or rebound, no palpable mass or hepatosplenomegaly, bowel sounds are present Extremities:  Without edema. Neurologic:  Alert and oriented,  grossly normal neurologically. Psych:  Cooperative. Normal mood and affect.  Intake/Output from previous day: 12/06 0701 - 12/07 0700 In: 620 [P.O.:120; IV Piggyback:500] Out: 500 [Stool:500] Intake/Output this shift: No intake/output data recorded.  Lab Results: Recent Labs    11/16/24 1603 11/17/24 1047 11/17/24 1543 11/17/24 1851 11/17/24 2142 11/18/24 0509  WBC 12.9* 17.4*  --   --   --  12.8*  HGB 10.6* 10.6*   < > 9.4* 9.4* 8.8*  HCT 33.1* 34.3*   < > 29.1* 29.8* 27.9*  PLT  340.0 357  --   --   --  275   < > = values in this interval not displayed.   BMET Recent Labs    11/16/24 1603 11/17/24 1047  NA 137 136  K 3.4* 3.1*  CL 90* 90*  CO2 29 28  GLUCOSE 147* 214*  BUN 35* 12  CREATININE 4.72* 2.41*  CALCIUM 10.1 8.6*   LFT Recent Labs    11/17/24 1047  PROT 7.4  ALBUMIN  3.5  AST 24  ALT 33  ALKPHOS 93  BILITOT 0.5   PT/INR No results for input(s): LABPROT, INR in the last 72 hours.  Studies/Results: CT Angio Abd/Pel W and/or Wo Contrast Result Date: 11/17/2024 CLINICAL DATA:  Lower GI bleed.  Diarrhea with bright red blood. EXAM: CTA ABDOMEN AND PELVIS WITHOUT AND WITH CONTRAST TECHNIQUE: Multidetector CT imaging of the abdomen and pelvis was performed using the standard protocol during bolus administration of intravenous contrast. Multiplanar reconstructed images and MIPs were obtained and reviewed to evaluate the vascular anatomy. RADIATION DOSE REDUCTION: This exam was performed according to the departmental dose-optimization program which includes automated exposure control, adjustment of the mA and/or kV according to patient size and/or use of iterative reconstruction technique. CONTRAST:  OMNIPAQUE  IOHEXOL  350 MG/ML SOLN COMPARISON:  11/08/2024. FINDINGS: VASCULAR Aorta: Normal caliber aorta without aneurysm, dissection, vasculitis or significant stenosis. Aortic atherosclerosis. Celiac: Patent without evidence of aneurysm, dissection, vasculitis or significant stenosis. SMA: Patent without evidence of aneurysm, dissection, vasculitis or significant stenosis. Renals: Both renal arteries are patent without evidence of aneurysm or  dissection. There is moderate renal artery stenosis on the right. The left renal artery is patent. IMA: Patent without evidence of aneurysm, dissection, vasculitis or significant stenosis. Inflow: Patent without evidence of aneurysm, dissection, vasculitis or significant stenosis. Proximal Outflow: Bilateral  common femoral and visualized portions of the superficial and profunda femoral arteries are patent without evidence of aneurysm, dissection, vasculitis or significant stenosis. Veins: No obvious venous abnormality within the limitations of this arterial phase study. Review of the MIP images confirms the above findings. NON-VASCULAR Lower chest: There is a 4 mm ground-glass nodule in the right lower lobe, axial image 34. There is a 5 mm ground-glass nodule in the right lower lobe, axial image 51. Minimal atelectasis or scarring is noted at the lung bases. Hepatobiliary: No focal liver abnormality is seen. No gallstones, gallbladder wall thickening, or biliary dilatation. Pancreas: Unremarkable. No pancreatic ductal dilatation or surrounding inflammatory changes. Spleen: Normal in size without focal abnormality. Adrenals/Urinary Tract: The adrenal glands are within normal limits. Renal atrophy is noted bilaterally. There are multiple renal cysts bilaterally. No renal calculus or hydronephrosis. The bladder is unremarkable. Stomach/Bowel: The stomach is within normal limits. No bowel obstruction, free air, or pneumatosis is seen. Evaluation for hemorrhage is limited due to the presence of oral contrast. A few scattered diverticula are noted along the colon without evidence of diverticulitis. There is persistent circumferential wall thickening involving the ascending colon with mild associated fat stranding. Appendix is not seen. Lymphatic: Enlarged lymph nodes are noted in the right lower quadrant adjacent to the region of colonic wall thickening. Reproductive: Uterine fibroids are noted.  No adnexal mass is seen. Other: No abdominopelvic ascites. Large fat containing umbilical hernia is present. Musculoskeletal: Degenerative changes are noted in the thoracolumbar spine. No acute osseous abnormality is identified. IMPRESSION: VASCULAR 1. No evidence of active hemorrhage, however evaluation of the bowel is limited due  to the presence of enteric contrast. 2. Aortic atherosclerosis. NON-VASCULAR 1. No definite evidence of acute or active hemorrhage. Evaluation is limited due to the presence of oral contrast. 2. Persistent circumferential colonic wall thickening involving the ascending colon with associated inflammation and lymphadenopathy, which may be infectious, inflammatory, or neoplastic. Correlation with colonoscopy is recommended. 3. Diverticulosis without diverticulitis. 4. Right lower lobe ground-glass nodules measuring up to 5 mm. Follow-up by chest CT without contrast is recommended in 2 years, and again at 4 years to confirm stability. This recommendation follows the consensus statement: Recommendations for the Management of Subsolid Pulmonary Nodules Detected at CT: A Statement from the Fleischner Society as published in Radiology 2013; 266:304-317. 5. Remaining incidental findings as described above. Electronically Signed   By: Leita Birmingham M.D.   On: 11/17/2024 14:38       Assessment / Plan:    #74 73 year old white female, known to the GI service with end-stage renal disease on dialysis, who presented to the emergency room yesterday after she had undergone outpatient dialysis in AM.  She had a grossly bloody stool on Friday evening and then further yesterday morning.  She had just been discharged from the hospital on 11/12/2024 after being admitted with acute right-sided ischemic colitis with SIRS/sepsis, hypotension requiring pressor support.  She completed a 7-day course of Zosyn .  Her symptoms had improved but had not completely resolved but she was not having any rectal bleeding at the time of discharge.  Repeat CT shows the same area of right sided ischemic colitis.  She is not having severe pain this time.  She  has been having issues with significant hypotension during dialysis, and intermittent hypotension on nondialysis days.  Previously she had been getting midodrine  just with dialysis and was  asked to start midodrine  10 mg 3 times daily regularly just a few days back.  She probably has had a mild recurrent injury in the same area of nonhealed right sided ischemic colitis.  No active bleeding since admission, abdominal pain/discomfort improved  #2 anemia acute on chronic secondary to above #3 diabetes mellitus #4 peripheral neuropathy #5 chronic hypotension as above  Plan; okay for regular renal diet Per nephrology they plan to increase her dry weight as means to avoid severe hypotension with dialysis and continue midodrine  10 mg 3 times daily  Continue to monitor hemoglobin and transfuse as indicated Continue 5-day course of antibiotics/on ceftriaxone  currently No indication for colonoscopy at this time GI will continue to follow with you     Principal Problem:   Colitis with rectal bleeding Active Problems:   Type 2 diabetes mellitus with other diabetic kidney complication ([name])   Anemia   ESRD on dialysis ([name])   Hypotension   Hypokalemia   Leukocytosis   Anxiety and depression   Ischemic colitis     LOS: 0 days   Amy Esterwood PA-C 11/18/2024, 8:42 AM     Attending physician's note   I personally saw the patient and performed a substantive portion of the medical decision making process for this encounter (including a complete performance of the key components : MDM, Hx and Exam), in conjunction with the APP.  I agree with the APP's note, impression, and  the management plan for the number and complexity of problems addressed at the encounter for the patient and take responsibility for that plan with its inherent risk of complications, morbidity, or mortality with additional input as follows.      Feeling better.  No further bleeding.  On exam abdomen soft with no tenderness or rebound.  Leukocytosis improving.  Slight decline in hemoglobin continue to monitor and transfuse if below 7  Nephrology is planning to adjust dry weight to prevent excessive  pulling of fluid during dialysis Continue midodrine  10 mg 3 times daily, goal MAP >50  Continue diet as tolerated  GI will continue to follow along  I have spent >35 minutes of patient care (this includes precharting, chart review, review of results, face-to-face time used for counseling as well as treatment plan and follow-up. The patient was provided an opportunity to ask questions and all were answered. The patient agreed with the plan and demonstrated an understanding of the instructions.   LOIS Wilkie Mcgee , MD [phone]

## 2024-11-18 NOTE — Telephone Encounter (Signed)
 Noted. Thanks.

## 2024-11-18 NOTE — Progress Notes (Signed)
 PROGRESS NOTE    MACHELLE RAYBON  FMW:[id number] DOB: 11/14/1951 DOA: 11/17/2024 PCP: Garald Karlynn GAILS, MD    Brief Narrative:   [name] is a 73 y.o. female with medical history significant of ESRD on HD TTS, hypertension, diabetes mellitus type 2, chronic pain, arthritis, and obesity presents with complaints of rectal bleeding   Patient had just recently been hospitalized with septic shock thought secondary to right sided colitis from 11/25-12/1 requiring admission into the ICU on pressors and antibiotics.  GI had been consulted but deferred colonoscopy GI.  Pathogen panel was negative.  Vascular surgery consulted but CT angiogram without focal lesion for intervention GI thought symptoms likely secondary to ischemic colitis for which patient had in the past.  Patient completed 7 days of Zosyn  and was noted to have loose stools prior to being discharged home.  she has experienced weakness but managed at home with her sister's assistance. She attended dialysis on Tuesday without issues, and her blood pressure remained stable during the session.  Assessment and Plan: Colitis with rectal bleeding Patient presents with complaints of rectal bleeding and some mild epigastric abdominal pain. Stool guaiacs were noted to be positive.  Hemoglobin 10.6.  He was recently hospitalized with similar symptoms for which symptoms were thought most likely related to colitis.  Fecal calprotectin during last hospitalization was elevated greater than 8000.  CT scan concerning for persistent circumferential colonic wall thickening involving the ascending colon with associated inflammation and lymphadenopathy.   Patient with similar history of the same.  Initially started on empiric antibiotics of Rocephin  and metronidazole .  GI consulted: continue abx for now   Leukocytosis Acute.   -trending down   Chronic hypotension Blood pressures initially noted to be as low as 82/45.  Patient reports being recently  advised to take midodrine  10 mg 3 times daily. - Continue midodrine  3 times daily   Hypokalemia -trending up   Anemia of chronic disease Hemoglobin noted to be 10.6 which appears around patient's most recent check on 12/5.  She received EPO with dialysis. - Continue to monitor H&H - Consider need of transfusion if hemoglobin drops less than 7-8  or symptomatic   ESRD on HD Labs noted potassium 3.1, CO2 28, BUN 12, creatinine 2.41.  Patient was dialyzed saturday - Nephrology notified of patient admission and recommendations in regards to more liberal fluid intake   Diabetes mellitus type 2, without long-term use of insulin  Admission glucose noted to be 216.  Last available hemoglobin A1c noted to be 6.1 - Hypoglycemia protocols - CBGs before every meal with very sensitive SSI   Depression and anxiety - Continue Wellbutrin  and Prozac    Chronic pain Patient reports having significant arthritis pain which she is on oxycodone  and had been using ibuprofen intermittently for uncontrolled pain. - Continue oxycodone  - Counseled on need of cessation of NSAID use   Prob sleep apnea -outpatient follow up  Obesity Estimated body mass index is 34.46 kg/m as calculated from the following:   Height as of this encounter: 5' 0.5 (1.537 m).   Weight as of this encounter: 81.4 kg.   DVT prophylaxis: SCDs Start: 11/17/24 1542    Code Status: Full Code   Disposition Plan:  Level of care: Progressive Status is: Observation     Consultants:  GI renal  Subjective: Feeling better today than yesterday  Objective: Vitals:   11/18/24 0723 11/18/24 0935 11/18/24 0938 11/18/24 0940  BP: (!) 112/44     Pulse: 70  73 72 74  Resp: 18     Temp: 97.9 F (36.6 C)     TempSrc: Oral     SpO2: 92% (!) 85% 97% (!) 84%  Weight:      Height:        Intake/Output Summary (Last 24 hours) at 11/18/2024 1106 Last data filed at 11/17/2024 1722 Gross per 24 hour  Intake 620 ml  Output 500 ml   Net 120 ml   Filed Weights   11/17/24 1040 11/17/24 1657  Weight: 82 kg 81.4 kg    Examination:   General: Appearance:    Obese female in no acute distress     Lungs:     Clear to auscultation bilaterally, respirations unlabored  Heart:    Normal heart rate..    MS:   All extremities are intact.    Neurologic:   Awake, alert, oriented x 3. No apparent focal neurological           defect.        Data Reviewed: I have personally reviewed following labs and imaging studies  CBC: Recent Labs  Lab 11/16/24 1603 11/17/24 1047 11/17/24 1543 11/17/24 1851 11/17/24 2142 11/18/24 0509  WBC 12.9* 17.4*  --   --   --  12.8*  NEUTROABS 8.4*  --   --   --   --   --   HGB 10.6* 10.6* 9.6* 9.4* 9.4* 8.8*  HCT 33.1* 34.3* 30.3* 29.1* 29.8* 27.9*  MCV 96.4 99.7  --   --   --  99.3  PLT 340.0 357  --   --   --  275   Basic Metabolic Panel: Recent Labs  Lab 11/16/24 1603 11/17/24 1047 11/18/24 0836  NA 137 136 137  K 3.4* 3.1* 3.3*  CL 90* 90* 97*  CO2 29 28 26   GLUCOSE 147* 214* 152*  BUN 35* 12 18  CREATININE 4.72* 2.41* 4.00*  CALCIUM 10.1 8.6* 8.4*  MG 2.4  --   --    GFR: Estimated Creatinine Clearance: 12.2 mL/min (A) (by C-G formula based on SCr of 4 mg/dL (H)). Liver Function Tests: Recent Labs  Lab 11/16/24 1603 11/17/24 1047  AST 18 24  ALT 38* 33  ALKPHOS 95 93  BILITOT 0.4 0.5  PROT 7.5 7.4  ALBUMIN  4.4 3.5   No results for input(s): LIPASE, AMYLASE in the last 168 hours. No results for input(s): AMMONIA in the last 168 hours. Coagulation Profile: No results for input(s): INR, PROTIME in the last 168 hours. Cardiac Enzymes: No results for input(s): CKTOTAL, CKMB, CKMBINDEX, TROPONINI in the last 168 hours. BNP (last 3 results) No results for input(s): PROBNP in the last 8760 hours. HbA1C: No results for input(s): HGBA1C in the last 72 hours. CBG: Recent Labs  Lab 11/17/24 1702 11/17/24 2112 11/18/24 0720  GLUCAP 82  159* 90   Lipid Profile: No results for input(s): CHOL, HDL, LDLCALC, TRIG, CHOLHDL, LDLDIRECT in the last 72 hours. Thyroid  Function Tests: No results for input(s): TSH, T4TOTAL, FREET4, T3FREE, THYROIDAB in the last 72 hours. Anemia Panel: Recent Labs    11/16/24 1603  VITAMINB12 647   Sepsis Labs: No results for input(s): PROCALCITON, LATICACIDVEN in the last 168 hours.  Recent Results (from the past 240 hours)  Gastrointestinal Panel by PCR , Stool     Status: None   Collection Time: 11/09/24 10:03 AM   Specimen: Stool  Result Value Ref Range Status   Campylobacter species NOT DETECTED  NOT DETECTED Final   Plesimonas shigelloides NOT DETECTED NOT DETECTED Final   Salmonella species NOT DETECTED NOT DETECTED Final   Yersinia enterocolitica NOT DETECTED NOT DETECTED Final   Vibrio species NOT DETECTED NOT DETECTED Final   Vibrio cholerae NOT DETECTED NOT DETECTED Final   Enteroaggregative E coli (EAEC) NOT DETECTED NOT DETECTED Final   Enteropathogenic E coli (EPEC) NOT DETECTED NOT DETECTED Final   Enterotoxigenic E coli (ETEC) NOT DETECTED NOT DETECTED Final   Shiga like toxin producing E coli (STEC) NOT DETECTED NOT DETECTED Final   Shigella/Enteroinvasive E coli (EIEC) NOT DETECTED NOT DETECTED Final   Cryptosporidium NOT DETECTED NOT DETECTED Final   Cyclospora cayetanensis NOT DETECTED NOT DETECTED Final   Entamoeba histolytica NOT DETECTED NOT DETECTED Final   Giardia lamblia NOT DETECTED NOT DETECTED Final   Adenovirus F40/41 NOT DETECTED NOT DETECTED Final   Astrovirus NOT DETECTED NOT DETECTED Final   Norovirus GI/GII NOT DETECTED NOT DETECTED Final   Rotavirus A NOT DETECTED NOT DETECTED Final   Sapovirus (I, II, IV, and V) NOT DETECTED NOT DETECTED Final    Comment: Performed at [facility], [address] [place], [place] [address]  Calprotectin, Fecal     Status: Abnormal   Collection Time: 11/09/24 10:04 AM   Specimen:  Stool  Result Value Ref Range Status   Calprotectin, Fecal 842 (H) 0 - 120 ug/g Final    Comment: (NOTE) **Results verified by repeat testing** Concentration     Interpretation   Follow-Up < 5 - 50 ug/g     Normal           None >50 -120 ug/g     Borderline       Re-evaluate in 4-6 weeks    >120 ug/g     Abnormal         Repeat as clinically                                   indicated Performed At: [facility] [address] [place], [place] [id number] Jennette Shorter MD Ey:[phone]          Radiology Studies: CT Angio Abd/Pel W and/or Wo Contrast Result Date: 11/17/2024 CLINICAL DATA:  Lower GI bleed.  Diarrhea with bright red blood. EXAM: CTA ABDOMEN AND PELVIS WITHOUT AND WITH CONTRAST TECHNIQUE: Multidetector CT imaging of the abdomen and pelvis was performed using the standard protocol during bolus administration of intravenous contrast. Multiplanar reconstructed images and MIPs were obtained and reviewed to evaluate the vascular anatomy. RADIATION DOSE REDUCTION: This exam was performed according to the departmental dose-optimization program which includes automated exposure control, adjustment of the mA and/or kV according to patient size and/or use of iterative reconstruction technique. CONTRAST:  OMNIPAQUE  IOHEXOL  350 MG/ML SOLN COMPARISON:  11/08/2024. FINDINGS: VASCULAR Aorta: Normal caliber aorta without aneurysm, dissection, vasculitis or significant stenosis. Aortic atherosclerosis. Celiac: Patent without evidence of aneurysm, dissection, vasculitis or significant stenosis. SMA: Patent without evidence of aneurysm, dissection, vasculitis or significant stenosis. Renals: Both renal arteries are patent without evidence of aneurysm or dissection. There is moderate renal artery stenosis on the right. The left renal artery is patent. IMA: Patent without evidence of aneurysm, dissection, vasculitis or significant stenosis. Inflow: Patent without evidence of aneurysm,  dissection, vasculitis or significant stenosis. Proximal Outflow: Bilateral common femoral and visualized portions of the superficial and profunda femoral arteries are patent without evidence  of aneurysm, dissection, vasculitis or significant stenosis. Veins: No obvious venous abnormality within the limitations of this arterial phase study. Review of the MIP images confirms the above findings. NON-VASCULAR Lower chest: There is a 4 mm ground-glass nodule in the right lower lobe, axial image 34. There is a 5 mm ground-glass nodule in the right lower lobe, axial image 51. Minimal atelectasis or scarring is noted at the lung bases. Hepatobiliary: No focal liver abnormality is seen. No gallstones, gallbladder wall thickening, or biliary dilatation. Pancreas: Unremarkable. No pancreatic ductal dilatation or surrounding inflammatory changes. Spleen: Normal in size without focal abnormality. Adrenals/Urinary Tract: The adrenal glands are within normal limits. Renal atrophy is noted bilaterally. There are multiple renal cysts bilaterally. No renal calculus or hydronephrosis. The bladder is unremarkable. Stomach/Bowel: The stomach is within normal limits. No bowel obstruction, free air, or pneumatosis is seen. Evaluation for hemorrhage is limited due to the presence of oral contrast. A few scattered diverticula are noted along the colon without evidence of diverticulitis. There is persistent circumferential wall thickening involving the ascending colon with mild associated fat stranding. Appendix is not seen. Lymphatic: Enlarged lymph nodes are noted in the right lower quadrant adjacent to the region of colonic wall thickening. Reproductive: Uterine fibroids are noted.  No adnexal mass is seen. Other: No abdominopelvic ascites. Large fat containing umbilical hernia is present. Musculoskeletal: Degenerative changes are noted in the thoracolumbar spine. No acute osseous abnormality is identified. IMPRESSION: VASCULAR 1. No  evidence of active hemorrhage, however evaluation of the bowel is limited due to the presence of enteric contrast. 2. Aortic atherosclerosis. NON-VASCULAR 1. No definite evidence of acute or active hemorrhage. Evaluation is limited due to the presence of oral contrast. 2. Persistent circumferential colonic wall thickening involving the ascending colon with associated inflammation and lymphadenopathy, which may be infectious, inflammatory, or neoplastic. Correlation with colonoscopy is recommended. 3. Diverticulosis without diverticulitis. 4. Right lower lobe ground-glass nodules measuring up to 5 mm. Follow-up by chest CT without contrast is recommended in 2 years, and again at 4 years to confirm stability. This recommendation follows the consensus statement: Recommendations for the Management of Subsolid Pulmonary Nodules Detected at CT: A Statement from the Fleischner Society as published in Radiology 2013; 266:304-317. 5. Remaining incidental findings as described above. Electronically Signed   By: Leita Birmingham M.D.   On: 11/17/2024 14:38        Scheduled Meds:  buPROPion   300 mg Oral Daily   [START ON 11/20/2024] calcitRIOL   2.75 mcg Oral Q T,Th,Sa-HD   [START ON 11/20/2024] cinacalcet   30 mg Oral Once per day on Tuesday Thursday Saturday   diclofenac  Sodium  2 g Topical QID   FLUoxetine   40 mg Oral Daily   insulin  aspart  0-6 Units Subcutaneous TID WC   midodrine   10 mg Oral TID WC   sodium chloride  flush  3 mL Intravenous Q12H   Continuous Infusions:  cefTRIAXone  (ROCEPHIN )  IV       LOS: 0 days    Time spent: 45 minutes spent on chart review, discussion with nursing staff, consultants, updating family and interview/physical exam; more than 50% of that time was spent in counseling and/or coordination of care.    Harlene RAYMOND Bowl, DO [facility] Available via Epic secure chat 7am-7pm After these hours, please refer to coverage provider listed on [email or web address] 11/18/2024, 11:06 AM

## 2024-11-18 NOTE — Progress Notes (Addendum)
 [facility] Progress Note   Subjective:   Patient seen and examined at bedside.  Complains of swelling and tenderness in L forearm after IV infiltrated yesterday.  Continues to have abdominal pain but it is a little better.  Denies CP, SOB and vomiting.  States she does not intend to increase PO fluid intake because in the past this caused dialysis to be even harder for her.    Objective Vitals:   11/18/24 0935 11/18/24 0938 11/18/24 0940 11/18/24 1114  BP:    119/82  Pulse: 73 72 74 70  Resp:    16  Temp:    98.1 F (36.7 C)  TempSrc:    Oral  SpO2: (!) 85% 97% (!) 84% 99%  Weight:      Height:       Physical Exam General:pleasant, female in NAD Heart:RRR Lungs:CTAB, nml WOB on RA Abdomen:soft, NTND Extremities:no LE edema Dialysis Access: RU AVF    [facility] Weights   11/17/24 1040 11/17/24 1657  Weight: 82 kg 81.4 kg    Intake/Output Summary (Last 24 hours) at 11/18/2024 1235 Last data filed at 11/18/2024 0730 Gross per 24 hour  Intake 860 ml  Output 500 ml  Net 360 ml    Additional Objective Labs: Basic Metabolic Panel: Recent Labs  Lab 11/16/24 1603 11/17/24 1047 11/18/24 0836  NA 137 136 137  K 3.4* 3.1* 3.3*  CL 90* 90* 97*  CO2 29 28 26   GLUCOSE 147* 214* 152*  BUN 35* 12 18  CREATININE 4.72* 2.41* 4.00*  CALCIUM 10.1 8.6* 8.4*   Liver Function Tests: Recent Labs  Lab 11/16/24 1603 11/17/24 1047  AST 18 24  ALT 38* 33  ALKPHOS 95 93  BILITOT 0.4 0.5  PROT 7.5 7.4  ALBUMIN  4.4 3.5   CBC: Recent Labs  Lab 11/16/24 1603 11/17/24 1047 11/17/24 1543 11/17/24 1851 11/17/24 2142 11/18/24 0509  WBC 12.9* 17.4*  --   --   --  12.8*  NEUTROABS 8.4*  --   --   --   --   --   HGB 10.6* 10.6*   < > 9.4* 9.4* 8.8*  HCT 33.1* 34.3*   < > 29.1* 29.8* 27.9*  MCV 96.4 99.7  --   --   --  99.3  PLT 340.0 357  --   --   --  275   < > = values in this interval not displayed.   CBG: Recent Labs  Lab 11/17/24 1702 11/17/24 2112  11/18/24 0720 11/18/24 1112  GLUCAP 82 159* 90 160*    Studies/Results: CT Angio Abd/Pel W and/or Wo Contrast Result Date: 11/17/2024 CLINICAL DATA:  Lower GI bleed.  Diarrhea with bright red blood. EXAM: CTA ABDOMEN AND PELVIS WITHOUT AND WITH CONTRAST TECHNIQUE: Multidetector CT imaging of the abdomen and pelvis was performed using the standard protocol during bolus administration of intravenous contrast. Multiplanar reconstructed images and MIPs were obtained and reviewed to evaluate the vascular anatomy. RADIATION DOSE REDUCTION: This exam was performed according to the departmental dose-optimization program which includes automated exposure control, adjustment of the mA and/or kV according to patient size and/or use of iterative reconstruction technique. CONTRAST:  OMNIPAQUE  IOHEXOL  350 MG/ML SOLN COMPARISON:  11/08/2024. FINDINGS: VASCULAR Aorta: Normal caliber aorta without aneurysm, dissection, vasculitis or significant stenosis. Aortic atherosclerosis. Celiac: Patent without evidence of aneurysm, dissection, vasculitis or significant stenosis. SMA: Patent without evidence of aneurysm, dissection, vasculitis or significant stenosis. Renals: Both renal arteries are patent  without evidence of aneurysm or dissection. There is moderate renal artery stenosis on the right. The left renal artery is patent. IMA: Patent without evidence of aneurysm, dissection, vasculitis or significant stenosis. Inflow: Patent without evidence of aneurysm, dissection, vasculitis or significant stenosis. Proximal Outflow: Bilateral common femoral and visualized portions of the superficial and profunda femoral arteries are patent without evidence of aneurysm, dissection, vasculitis or significant stenosis. Veins: No obvious venous abnormality within the limitations of this arterial phase study. Review of the MIP images confirms the above findings. NON-VASCULAR Lower chest: There is a 4 mm ground-glass nodule in the right  lower lobe, axial image 34. There is a 5 mm ground-glass nodule in the right lower lobe, axial image 51. Minimal atelectasis or scarring is noted at the lung bases. Hepatobiliary: No focal liver abnormality is seen. No gallstones, gallbladder wall thickening, or biliary dilatation. Pancreas: Unremarkable. No pancreatic ductal dilatation or surrounding inflammatory changes. Spleen: Normal in size without focal abnormality. Adrenals/Urinary Tract: The adrenal glands are within normal limits. Renal atrophy is noted bilaterally. There are multiple renal cysts bilaterally. No renal calculus or hydronephrosis. The bladder is unremarkable. Stomach/Bowel: The stomach is within normal limits. No bowel obstruction, free air, or pneumatosis is seen. Evaluation for hemorrhage is limited due to the presence of oral contrast. A few scattered diverticula are noted along the colon without evidence of diverticulitis. There is persistent circumferential wall thickening involving the ascending colon with mild associated fat stranding. Appendix is not seen. Lymphatic: Enlarged lymph nodes are noted in the right lower quadrant adjacent to the region of colonic wall thickening. Reproductive: Uterine fibroids are noted.  No adnexal mass is seen. Other: No abdominopelvic ascites. Large fat containing umbilical hernia is present. Musculoskeletal: Degenerative changes are noted in the thoracolumbar spine. No acute osseous abnormality is identified. IMPRESSION: VASCULAR 1. No evidence of active hemorrhage, however evaluation of the bowel is limited due to the presence of enteric contrast. 2. Aortic atherosclerosis. NON-VASCULAR 1. No definite evidence of acute or active hemorrhage. Evaluation is limited due to the presence of oral contrast. 2. Persistent circumferential colonic wall thickening involving the ascending colon with associated inflammation and lymphadenopathy, which may be infectious, inflammatory, or neoplastic. Correlation with  colonoscopy is recommended. 3. Diverticulosis without diverticulitis. 4. Right lower lobe ground-glass nodules measuring up to 5 mm. Follow-up by chest CT without contrast is recommended in 2 years, and again at 4 years to confirm stability. This recommendation follows the consensus statement: Recommendations for the Management of Subsolid Pulmonary Nodules Detected at CT: A Statement from the Fleischner Society as published in Radiology 2013; 266:304-317. 5. Remaining incidental findings as described above. Electronically Signed   By: Leita Birmingham M.D.   On: 11/17/2024 14:38    Medications:  cefTRIAXone  (ROCEPHIN )  IV      buPROPion   300 mg Oral Daily   [START ON 11/20/2024] calcitRIOL   2.75 mcg Oral Q T,Th,Sa-HD   [START ON 11/20/2024] cinacalcet   30 mg Oral Once per day on Tuesday Thursday Saturday   diclofenac  Sodium  2 g Topical QID   FLUoxetine   40 mg Oral Daily   insulin  aspart  0-6 Units Subcutaneous TID WC   midodrine   10 mg Oral TID WC   sodium chloride  flush  3 mL Intravenous Q12H    Dialysis Orders: [facility] TTS 400 BF Auto 1.5 for DF 2K/2ca EDW 82.5 kg Last post weight 82.0 kg on 12/6 AVF Mircera 75 mcg every 2 weeks (last given on 12/4) Calcitriol   2.75 mcg three times a week with HD Sensipar  30 mg three times a week with HD Iron sucrose  50 mg weekly  Assessment/Plan: 1. Ischemic colitis - recurrent issue. Now with rectal bleeding.  Per PMD/GI. 2. ESRD - on TTS. Next HD on 11/20/24.  3. Anemia of CKD- Hgb 8.8. Mircera recently dosed. Continue to monitor.  4. Secondary hyperparathyroidism - Calcium in goal. Check phos. Continue home meds. 5. Hypotension - on midodrine .  Previously only taking with HD but now increased to TID. BP in goal today. 6. Volume - Will increase EDW 0.5kg. Discussed not increasing PO fluid intake as it would require increased UF with dialysis.   7. Nutrition - Renal diet w/fluid restrictions.  8. Hypokalemia - K a little better this AM.  Use  increased K bath with HD if remains low.    Manuelita Labella, PA-C [facility] 11/18/2024,12:35 PM  LOS: 0 days     Seen and examined independently.  Agree with note and exam as documented above by physician extender and as noted here.  No stools today.  Had blood BM's here yesterday. Feels better  General elderly female in bed in no acute distress HEENT normocephalic atraumatic extraocular movements intact sclera anicteric Neck supple trachea midline Lungs clear to auscultation bilaterally normal work of breathing at rest on room air Heart S1S2 no rub Abdomen soft nontender nondistended Extremities no edema  Psych normal mood and affect Neuro - alert and oriented x 3 provides hx and follows commands Access LUE AVF with bruit and thrill   ESRD - on HD TTS. Needs a dry weight increase consider - 83-83.5 kg.  Keep the fluid restrictions at 1.2 kg to avoid exacerbating her ischemic colitis  Chronic hypotension - on midodrine  which is now 10 mg TID - continue  Anemia of CKD - and acute blood loss from GI losses - recently rec'd mircera. PRBC's per primary team    Katheryn JAYSON Saba, MD 11/18/2024  5:39 PM

## 2024-11-18 NOTE — Care Management Obs Status (Signed)
 MEDICARE OBSERVATION STATUS NOTIFICATION   Patient Details  Name: [name] MRN: [id number] Date of Birth: [birth year]   Medicare Observation Status Notification Given:  Yes    Orry Sigl G., RN 11/18/2024, 9:12 AM

## 2024-11-19 ENCOUNTER — Telehealth: Payer: Self-pay

## 2024-11-19 ENCOUNTER — Ambulatory Visit: Payer: Self-pay | Admitting: Internal Medicine

## 2024-11-19 LAB — CBC
HCT: 29.3 % — ABNORMAL LOW (ref 36.0–46.0)
Hemoglobin: 9.2 g/dL — ABNORMAL LOW (ref 12.0–15.0)
MCH: 31.1 pg (ref 26.0–34.0)
MCHC: 31.4 g/dL (ref 30.0–36.0)
MCV: 99 fL (ref 80.0–100.0)
Platelets: 276 K/uL (ref 150–400)
RBC: 2.96 MIL/uL — ABNORMAL LOW (ref 3.87–5.11)
RDW: 14.8 % (ref 11.5–15.5)
WBC: 12.8 K/uL — ABNORMAL HIGH (ref 4.0–10.5)
nRBC: 0.2 % (ref 0.0–0.2)

## 2024-11-19 LAB — GLUCOSE, CAPILLARY
Glucose-Capillary: 124 mg/dL — ABNORMAL HIGH (ref 70–99)
Glucose-Capillary: 81 mg/dL (ref 70–99)
Glucose-Capillary: 178 mg/dL — ABNORMAL HIGH (ref 70–99)
Glucose-Capillary: 130 mg/dL — ABNORMAL HIGH (ref 70–99)

## 2024-11-19 LAB — BASIC METABOLIC PANEL WITH GFR
Anion gap: 13 (ref 5–15)
BUN: 28 mg/dL — ABNORMAL HIGH (ref 8–23)
CO2: 28 mmol/L (ref 22–32)
Calcium: 9 mg/dL (ref 8.9–10.3)
Chloride: 95 mmol/L — ABNORMAL LOW (ref 98–111)
Creatinine, Ser: 6.13 mg/dL — ABNORMAL HIGH (ref 0.44–1.00)
GFR, Estimated: 7 mL/min — ABNORMAL LOW (ref >60)
Glucose, Bld: 145 mg/dL — ABNORMAL HIGH (ref 70–99)
Potassium: 3.5 mmol/L (ref 3.5–5.1)
Sodium: 136 mmol/L (ref 135–145)

## 2024-11-19 MED ORDER — CHLORHEXIDINE GLUCONATE CLOTH 2 % EX PADS
6.0000 | MEDICATED_PAD | Freq: Every day | CUTANEOUS | Status: DC
  Administered 2024-11-21: 6 via TOPICAL

## 2024-11-19 MED ORDER — AMOXICILLIN-POT CLAVULANATE 500-125 MG PO TABS
1.0000 | ORAL_TABLET | Freq: Every day | ORAL | Status: DC
  Administered 2024-11-19 – 2024-11-20 (×2): 1 via ORAL
  Filled 2024-11-19 (×3): qty 1

## 2024-11-19 MED ORDER — METRONIDAZOLE 500 MG/100ML IV SOLN: 500.0000 mg | Freq: Two times a day (BID) | INTRAVENOUS | Status: DC

## 2024-11-19 MED ORDER — LORAZEPAM 0.5 MG PO TABS
0.5000 mg | ORAL_TABLET | Freq: Once | ORAL | Status: AC
  Administered 2024-11-19: 0.5 mg via ORAL
  Filled 2024-11-19: qty 1

## 2024-11-19 NOTE — Progress Notes (Signed)
 Progress Note   Subjective  Patient slowly improving. Pain improved but not resolved. Passed a brown stool this AM with some old blood.    Objective   Vital signs in last 24 hours: Temp:  [97.8 F (36.6 C)-98.3 F (36.8 C)] 98.3 F (36.8 C) (12/08 0812) Pulse Rate:  [63-71] 70 (12/08 0812) Resp:  [16-20] 17 (12/08 0812) BP: (108-127)/(38-82) 126/59 (12/08 0812) SpO2:  [87 %-99 %] 97 % (12/08 0812) Last BM Date : 11/18/24 General:    white female in NAD Neurologic:  Alert and oriented,  grossly normal neurologically. Psych:  Cooperative. Normal mood and affect.  Intake/Output from previous day: 12/07 0701 - 12/08 0700 In: 817 [P.O.:817] Out: -  Intake/Output this shift: No intake/output data recorded.  Lab Results: Recent Labs    11/18/24 0509 11/18/24 1408 11/19/24 0856  WBC 12.8* 12.9* 12.8*  HGB 8.8* 8.6* 9.2*  HCT 27.9* 26.8* 29.3*  PLT 275 276 276   BMET Recent Labs    11/17/24 1047 11/18/24 0836 11/19/24 0856  NA 136 137 136  K 3.1* 3.3* 3.5  CL 90* 97* 95*  CO2 28 26 28   GLUCOSE 214* 152* 145*  BUN 12 18 28*  CREATININE 2.41* 4.00* 6.13*  CALCIUM 8.6* 8.4* 9.0   LFT Recent Labs    11/17/24 1047  PROT 7.4  ALBUMIN  3.5  AST 24  ALT 33  ALKPHOS 93  BILITOT 0.5   PT/INR No results for input(s): LABPROT, INR in the last 72 hours.  Studies/Results: CT Angio Abd/Pel W and/or Wo Contrast Result Date: 11/17/2024 CLINICAL DATA:  Lower GI bleed.  Diarrhea with bright red blood. EXAM: CTA ABDOMEN AND PELVIS WITHOUT AND WITH CONTRAST TECHNIQUE: Multidetector CT imaging of the abdomen and pelvis was performed using the standard protocol during bolus administration of intravenous contrast. Multiplanar reconstructed images and MIPs were obtained and reviewed to evaluate the vascular anatomy. RADIATION DOSE REDUCTION: This exam was performed according to the departmental dose-optimization program which includes automated exposure control,  adjustment of the mA and/or kV according to patient size and/or use of iterative reconstruction technique. CONTRAST:  OMNIPAQUE  IOHEXOL  350 MG/ML SOLN COMPARISON:  11/08/2024. FINDINGS: VASCULAR Aorta: Normal caliber aorta without aneurysm, dissection, vasculitis or significant stenosis. Aortic atherosclerosis. Celiac: Patent without evidence of aneurysm, dissection, vasculitis or significant stenosis. SMA: Patent without evidence of aneurysm, dissection, vasculitis or significant stenosis. Renals: Both renal arteries are patent without evidence of aneurysm or dissection. There is moderate renal artery stenosis on the right. The left renal artery is patent. IMA: Patent without evidence of aneurysm, dissection, vasculitis or significant stenosis. Inflow: Patent without evidence of aneurysm, dissection, vasculitis or significant stenosis. Proximal Outflow: Bilateral common femoral and visualized portions of the superficial and profunda femoral arteries are patent without evidence of aneurysm, dissection, vasculitis or significant stenosis. Veins: No obvious venous abnormality within the limitations of this arterial phase study. Review of the MIP images confirms the above findings. NON-VASCULAR Lower chest: There is a 4 mm ground-glass nodule in the right lower lobe, axial image 34. There is a 5 mm ground-glass nodule in the right lower lobe, axial image 51. Minimal atelectasis or scarring is noted at the lung bases. Hepatobiliary: No focal liver abnormality is seen. No gallstones, gallbladder wall thickening, or biliary dilatation. Pancreas: Unremarkable. No pancreatic ductal dilatation or surrounding inflammatory changes. Spleen: Normal in size without focal abnormality. Adrenals/Urinary Tract: The adrenal glands are within normal limits. Renal atrophy is noted  bilaterally. There are multiple renal cysts bilaterally. No renal calculus or hydronephrosis. The bladder is unremarkable. Stomach/Bowel: The stomach is  within normal limits. No bowel obstruction, free air, or pneumatosis is seen. Evaluation for hemorrhage is limited due to the presence of oral contrast. A few scattered diverticula are noted along the colon without evidence of diverticulitis. There is persistent circumferential wall thickening involving the ascending colon with mild associated fat stranding. Appendix is not seen. Lymphatic: Enlarged lymph nodes are noted in the right lower quadrant adjacent to the region of colonic wall thickening. Reproductive: Uterine fibroids are noted.  No adnexal mass is seen. Other: No abdominopelvic ascites. Large fat containing umbilical hernia is present. Musculoskeletal: Degenerative changes are noted in the thoracolumbar spine. No acute osseous abnormality is identified. IMPRESSION: VASCULAR 1. No evidence of active hemorrhage, however evaluation of the bowel is limited due to the presence of enteric contrast. 2. Aortic atherosclerosis. NON-VASCULAR 1. No definite evidence of acute or active hemorrhage. Evaluation is limited due to the presence of oral contrast. 2. Persistent circumferential colonic wall thickening involving the ascending colon with associated inflammation and lymphadenopathy, which may be infectious, inflammatory, or neoplastic. Correlation with colonoscopy is recommended. 3. Diverticulosis without diverticulitis. 4. Right lower lobe ground-glass nodules measuring up to 5 mm. Follow-up by chest CT without contrast is recommended in 2 years, and again at 4 years to confirm stability. This recommendation follows the consensus statement: Recommendations for the Management of Subsolid Pulmonary Nodules Detected at CT: A Statement from the Fleischner Society as published in Radiology 2013; 266:304-317. 5. Remaining incidental findings as described above. Electronically Signed   By: Leita Birmingham M.D.   On: 11/17/2024 14:38       Assessment / Plan:    73 y/o female here with the following:  Ischemic  colitis Rectal bleeding Anemia ESRD on HD Chronic constipation - on phosphate binders, chronic narcotics  History reviewed.  Recurrent ischemic colitis.  She has no focal vascular finding on CTA that is amenable to intervention per vascular surgery.  She has risk factors for this to include chronic constipation on phosphate binders and chronic narcotics which can increase the risk for ischemic colitis, as well as taking NSAIDs routinely, and having hypotension during HD.  She does take MiraLAX  chronically however has a hard time keeping up with this and does not have bowel movement every day.  We discussed importance of strong bowel regimen to prevent constipation moving forward.  She really needs to avoid NSAIDs which can increase the risk for this, she understands and will do her best to do that.  She can use Tylenol  as needed for pain.  Ideally would minimize narcotics however it sounds like she cannot function without them.  I recommend she titrate up her MiraLAX  as best she can for goal bowel movement once daily, while trying to avoid loose watery stools which can lead to incontinence for her.  Her nephrology team will do their best to prevent hypotension during dialysis.  I agree that she does not need a colonoscopy right now, however could consider this as outpatient in the upcoming months once she has healed from this acute episode, given her prep was inadequate on her last exam.  She appears to be slowly improving, continue supportive measures for now.  Continue antibiotics, which has been transition to p.o.  Hopefully discharge home in the next 1 to 2 days.  She is scheduled to see us  in the office tomorrow for follow-up.  We  will cancel that appointment as she will not make it due to her hospitalization, and coordinate for another time in the next few weeks.  Will sign off for now, call with questions in the interim.  Marcey Naval, MD [facility]

## 2024-11-19 NOTE — Progress Notes (Signed)
 LUE measure in bend of arm due to prev.infiltration per Dr.Vann request.   Measurement at 8 inches, pt.aware of placement of tape measure for future assessments and changes.

## 2024-11-19 NOTE — Telephone Encounter (Signed)
 Thank you, AP

## 2024-11-19 NOTE — Progress Notes (Signed)
 [facility] Progress Note   Subjective:   Seen in room. She is having some belly pain this am. Poor appetite, doesn't feel like eating. Denies nausea/vomiting. She did see some blood in the toilet when she had a BM earlier.   Objective Vitals:   11/18/24 1603 11/18/24 2000 11/19/24 0400 11/19/24 0812  BP: (!) 108/38 (!) 127/50 116/66 (!) 126/59  Pulse: 68 71 63 70  Resp: 16 16 20 17   Temp: 98.2 F (36.8 C) 97.8 F (36.6 C) 97.9 F (36.6 C) 98.3 F (36.8 C)  TempSrc: Oral Oral Oral Oral  SpO2: 94% 92% (!) 87% 97%  Weight:      Height:       Physical Exam General: alert, nad  Heart: RRR Lungs: CTAB, nml WOB on RA Abdomen: soft, NTND Extremities: no LE edema Dialysis Access:  RU AVF  +bruit   Filed Weights   11/17/24 1040 11/17/24 1657  Weight: 82 kg 81.4 kg    Intake/Output Summary (Last 24 hours) at 11/19/2024 0942 Last data filed at 11/18/2024 2000 Gross per 24 hour  Intake 577 ml  Output --  Net 577 ml    Additional Objective Labs: Basic Metabolic Panel: Recent Labs  Lab 11/16/24 1603 11/17/24 1047 11/18/24 0836  NA 137 136 137  K 3.4* 3.1* 3.3*  CL 90* 90* 97*  CO2 29 28 26   GLUCOSE 147* 214* 152*  BUN 35* 12 18  CREATININE 4.72* 2.41* 4.00*  CALCIUM 10.1 8.6* 8.4*   Liver Function Tests: Recent Labs  Lab 11/16/24 1603 11/17/24 1047  AST 18 24  ALT 38* 33  ALKPHOS 95 93  BILITOT 0.4 0.5  PROT 7.5 7.4  ALBUMIN  4.4 3.5   CBC: Recent Labs  Lab 11/16/24 1603 11/17/24 1047 11/17/24 1543 11/18/24 0509 11/18/24 1408 11/19/24 0856  WBC 12.9* 17.4*  --  12.8* 12.9* 12.8*  NEUTROABS 8.4*  --   --   --  8.8*  --   HGB 10.6* 10.6*   < > 8.8* 8.6* 9.2*  HCT 33.1* 34.3*   < > 27.9* 26.8* 29.3*  MCV 96.4 99.7  --  99.3 98.5 99.0  PLT 340.0 357  --  275 276 276   < > = values in this interval not displayed.   CBG: Recent Labs  Lab 11/18/24 0720 11/18/24 1112 11/18/24 1600 11/18/24 2056 11/19/24 0811  GLUCAP 90 160* 73  171* 124*    Studies/Results: CT Angio Abd/Pel W and/or Wo Contrast Result Date: 11/17/2024 CLINICAL DATA:  Lower GI bleed.  Diarrhea with bright red blood. EXAM: CTA ABDOMEN AND PELVIS WITHOUT AND WITH CONTRAST TECHNIQUE: Multidetector CT imaging of the abdomen and pelvis was performed using the standard protocol during bolus administration of intravenous contrast. Multiplanar reconstructed images and MIPs were obtained and reviewed to evaluate the vascular anatomy. RADIATION DOSE REDUCTION: This exam was performed according to the departmental dose-optimization program which includes automated exposure control, adjustment of the mA and/or kV according to patient size and/or use of iterative reconstruction technique. CONTRAST:  OMNIPAQUE  IOHEXOL  350 MG/ML SOLN COMPARISON:  11/08/2024. FINDINGS: VASCULAR Aorta: Normal caliber aorta without aneurysm, dissection, vasculitis or significant stenosis. Aortic atherosclerosis. Celiac: Patent without evidence of aneurysm, dissection, vasculitis or significant stenosis. SMA: Patent without evidence of aneurysm, dissection, vasculitis or significant stenosis. Renals: Both renal arteries are patent without evidence of aneurysm or dissection. There is moderate renal artery stenosis on the right. The left renal artery is patent. IMA: Patent without  evidence of aneurysm, dissection, vasculitis or significant stenosis. Inflow: Patent without evidence of aneurysm, dissection, vasculitis or significant stenosis. Proximal Outflow: Bilateral common femoral and visualized portions of the superficial and profunda femoral arteries are patent without evidence of aneurysm, dissection, vasculitis or significant stenosis. Veins: No obvious venous abnormality within the limitations of this arterial phase study. Review of the MIP images confirms the above findings. NON-VASCULAR Lower chest: There is a 4 mm ground-glass nodule in the right lower lobe, axial image 34. There is a 5 mm  ground-glass nodule in the right lower lobe, axial image 51. Minimal atelectasis or scarring is noted at the lung bases. Hepatobiliary: No focal liver abnormality is seen. No gallstones, gallbladder wall thickening, or biliary dilatation. Pancreas: Unremarkable. No pancreatic ductal dilatation or surrounding inflammatory changes. Spleen: Normal in size without focal abnormality. Adrenals/Urinary Tract: The adrenal glands are within normal limits. Renal atrophy is noted bilaterally. There are multiple renal cysts bilaterally. No renal calculus or hydronephrosis. The bladder is unremarkable. Stomach/Bowel: The stomach is within normal limits. No bowel obstruction, free air, or pneumatosis is seen. Evaluation for hemorrhage is limited due to the presence of oral contrast. A few scattered diverticula are noted along the colon without evidence of diverticulitis. There is persistent circumferential wall thickening involving the ascending colon with mild associated fat stranding. Appendix is not seen. Lymphatic: Enlarged lymph nodes are noted in the right lower quadrant adjacent to the region of colonic wall thickening. Reproductive: Uterine fibroids are noted.  No adnexal mass is seen. Other: No abdominopelvic ascites. Large fat containing umbilical hernia is present. Musculoskeletal: Degenerative changes are noted in the thoracolumbar spine. No acute osseous abnormality is identified. IMPRESSION: VASCULAR 1. No evidence of active hemorrhage, however evaluation of the bowel is limited due to the presence of enteric contrast. 2. Aortic atherosclerosis. NON-VASCULAR 1. No definite evidence of acute or active hemorrhage. Evaluation is limited due to the presence of oral contrast. 2. Persistent circumferential colonic wall thickening involving the ascending colon with associated inflammation and lymphadenopathy, which may be infectious, inflammatory, or neoplastic. Correlation with colonoscopy is recommended. 3.  Diverticulosis without diverticulitis. 4. Right lower lobe ground-glass nodules measuring up to 5 mm. Follow-up by chest CT without contrast is recommended in 2 years, and again at 4 years to confirm stability. This recommendation follows the consensus statement: Recommendations for the Management of Subsolid Pulmonary Nodules Detected at CT: A Statement from the Fleischner Society as published in Radiology 2013; 266:304-317. 5. Remaining incidental findings as described above. Electronically Signed   By: Leita Birmingham M.D.   On: 11/17/2024 14:38    Medications:    amoxicillin -clavulanate  1 tablet Oral Q2000   buPROPion   300 mg Oral Daily   [START ON 11/20/2024] calcitRIOL   2.75 mcg Oral Q T,Th,Sa-HD   [START ON 11/20/2024] cinacalcet   30 mg Oral Once per day on Tuesday Thursday Saturday   diclofenac  Sodium  2 g Topical QID   FLUoxetine   40 mg Oral Daily   insulin  aspart  0-6 Units Subcutaneous TID WC   midodrine   10 mg Oral TID WC   sodium chloride  flush  3 mL Intravenous Q12H    Dialysis Orders: [facility] TTS 400 BF Auto 1.5 for DF 2K/2ca EDW 82.5 kg Last post weight 82.0 kg on 12/6 AVF Mircera 75 mcg every 2 weeks (last given on 12/4) Calcitriol  2.75 mcg three times a week with HD Sensipar  30 mg three times a week with HD Iron sucrose  50 mg weekly  Assessment/Plan: 1. Colitis - recurrent issue. With rectal bleeding on arrival. Now on PO Augmentin . GI following.  2. ESRD - on TTS. Next HD on 11/20/24.  3. Anemia of CKD- Hgb 8.8>9.2. . Mircera recently dosed. Continue to monitor.  4. Secondary hyperparathyroidism - Calcium in goal. Check phos. Continue home meds. 5. Hypotension - chronic issue, on midodrine .  Previously only taking with HD but now increased to TID.  6. Volume - Will increase EDW (83-83.5kg)  She understands that she needs to limit PO fluid intake as it would require increased UF with dialysis.   7. Nutrition - Renal diet w/fluid restrictions.  8. Hypokalemia - K   better this AM.  Use increased K bath with HD if remains low.    Maisie Fellows Treavon Castilleja PA-C 11/19/2024,9:42 AM

## 2024-11-19 NOTE — Progress Notes (Signed)
 Pt receives out-pt HD at [facility] on [facility] on TTS 5:40 am chair time. Will assist as needed.   Randine Mungo Dialysis Navigator [phone]

## 2024-11-19 NOTE — Telephone Encounter (Signed)
-----   Message from Elspeth SHAUNNA Naval sent at 11/19/2024 11:07 AM EST ----- Regarding: appointment for tomorrow Hello - this patient is currently scheduled to see [facility] tomorrow in the office but she is currently hospitalized.  Can you please cancel that appointment with [facility] and open it up for another patient.  This patient will need to be rebooked in a few weeks to be seen for posthospital follow-up, ischemic colitis, APP is fine.  She is a Dr. Abran patient.  Thanks  Dr A.

## 2024-11-19 NOTE — Progress Notes (Signed)
 Dr.Vann notified that pt's LUE is continuing to hurt and is slightly swollen from previous infiltration of contrast.

## 2024-11-19 NOTE — Plan of Care (Signed)

## 2024-11-19 NOTE — Telephone Encounter (Signed)
 Appt cancelled as requested.Pt rescheduled to see Dr. Abran 12/07/24 at 3:40pm.

## 2024-11-19 NOTE — Progress Notes (Addendum)
 PROGRESS NOTE    LEVETTE PAULICK  FMW:[id number] DOB: [birth year] DOA: 11/17/2024 PCP: Garald Karlynn GAILS, MD    Brief Narrative:   SLYVIA LARTIGUE is a 73 y.o. female with medical history significant of ESRD on HD TTS, hypertension, diabetes mellitus type 2, chronic pain, arthritis, and obesity presents with complaints of rectal bleeding   Patient had just recently been hospitalized with septic shock thought secondary to right sided colitis from 11/25-12/1 requiring admission into the ICU on pressors and antibiotics.  GI had been consulted but deferred colonoscopy GI.  Pathogen panel was negative.  Vascular surgery consulted but CT angiogram without focal lesion for intervention GI thought symptoms likely secondary to ischemic colitis for which patient had in the past.  Patient completed 7 days of Zosyn  and was noted to have loose stools prior to being discharged home.  she has experienced weakness but managed at home with her sister's assistance. She attended dialysis on Tuesday without issues, and her blood pressure remained stable during the session.  Assessment and Plan: Colitis with rectal bleeding Patient presents with complaints of rectal bleeding and some mild epigastric abdominal pain. Stool guaiacs were noted to be positive.  Hemoglobin 10.6.  He was recently hospitalized with similar symptoms for which symptoms were thought most likely related to colitis.  Fecal calprotectin during last hospitalization was elevated greater than 8000.  CT scan concerning for persistent circumferential colonic wall thickening involving the ascending colon with associated inflammation and lymphadenopathy.   Patient with similar history of the same.  Initially started on empiric antibiotics of Rocephin  and metronidazole .  GI consulted: continue abx for now -change to PO as lost IV access   Leukocytosis Acute.   -trending down   Chronic hypotension Blood pressures initially noted to be as low as 82/45.   Patient reports being recently advised to take midodrine  10 mg 3 times daily. - Continue midodrine  3 times daily   Hypokalemia -resolved   Anemia of chronic disease Hemoglobin noted to be 10.6 which appears around patient's most recent check on 12/5.  She received EPO with dialysis. - Continue to monitor H&H - Consider need of transfusion if hemoglobin drops less than 7-8  or symptomatic   ESRD on HD Labs noted potassium 3.1, CO2 28, BUN 12, creatinine 2.41.  Patient was dialyzed saturday - Nephrology notified of patient admission and recommendations in regards to more liberal fluid intake   Diabetes mellitus type 2, without long-term use of insulin  Admission glucose noted to be 216.  Last available hemoglobin A1c noted to be 6.1 - Hypoglycemia protocols - CBGs before every meal with very sensitive SSI   Depression and anxiety - Continue Wellbutrin  and Prozac    Chronic pain Patient reports having significant arthritis pain which she is on oxycodone  and had been using ibuprofen intermittently for uncontrolled pain. - Continue oxycodone  - Counseled on need of cessation of NSAID use   Prob sleep apnea -outpatient follow up  Contrast infiltration -slowly improving -elevate  -ice/heat  Obesity Estimated body mass index is 34.46 kg/m as calculated from the following:   Height as of this encounter: 5' 0.5 (1.537 m).   Weight as of this encounter: 81.4 kg.   DVT prophylaxis: SCDs Start: 11/17/24 1542    Code Status: Full Code   Disposition Plan:  Level of care: Progressive Status is: Observation     Consultants:  GI renal  Subjective: Arm is feeling better Had a BM today with some residual blood  Objective:  Vitals:   11/18/24 1603 11/18/24 2000 11/19/24 0400 11/19/24 0812  BP: (!) 108/38 (!) 127/50 116/66 (!) 126/59  Pulse: 68 71 63 70  Resp: 16 16 20 17   Temp: 98.2 F (36.8 C) 97.8 F (36.6 C) 97.9 F (36.6 C) 98.3 F (36.8 C)  TempSrc: Oral Oral  Oral Oral  SpO2: 94% 92% (!) 87% 97%  Weight:      Height:        Intake/Output Summary (Last 24 hours) at 11/19/2024 1027 Last data filed at 11/18/2024 2000 Gross per 24 hour  Intake 577 ml  Output --  Net 577 ml   Filed Weights   11/17/24 1040 11/17/24 1657  Weight: 82 kg 81.4 kg    Examination:   General: Appearance:    Obese female in no acute distress     Lungs:     Clear to auscultation bilaterally, respirations unlabored  Heart:    Normal heart rate..    MS:   All extremities are intact.    Neurologic:   Awake, alert, oriented x 3. No apparent focal neurological           defect.        Data Reviewed: I have personally reviewed following labs and imaging studies  CBC: Recent Labs  Lab 11/16/24 1603 11/17/24 1047 11/17/24 1543 11/17/24 1851 11/17/24 2142 11/18/24 0509 11/18/24 1408 11/19/24 0856  WBC 12.9* 17.4*  --   --   --  12.8* 12.9* 12.8*  NEUTROABS 8.4*  --   --   --   --   --  8.8*  --   HGB 10.6* 10.6*   < > 9.4* 9.4* 8.8* 8.6* 9.2*  HCT 33.1* 34.3*   < > 29.1* 29.8* 27.9* 26.8* 29.3*  MCV 96.4 99.7  --   --   --  99.3 98.5 99.0  PLT 340.0 357  --   --   --  275 276 276   < > = values in this interval not displayed.   Basic Metabolic Panel: Recent Labs  Lab 11/16/24 1603 11/17/24 1047 11/18/24 0836 11/19/24 0856  NA 137 136 137 136  K 3.4* 3.1* 3.3* 3.5  CL 90* 90* 97* 95*  CO2 29 28 26 28   GLUCOSE 147* 214* 152* 145*  BUN 35* 12 18 PENDING  CREATININE 4.72* 2.41* 4.00* 6.13*  CALCIUM 10.1 8.6* 8.4* 9.0  MG 2.4  --   --   --    GFR: Estimated Creatinine Clearance: 7.9 mL/min (A) (by C-G formula based on SCr of 6.13 mg/dL (H)). Liver Function Tests: Recent Labs  Lab 11/16/24 1603 11/17/24 1047  AST 18 24  ALT 38* 33  ALKPHOS 95 93  BILITOT 0.4 0.5  PROT 7.5 7.4  ALBUMIN  4.4 3.5   No results for input(s): LIPASE, AMYLASE in the last 168 hours. No results for input(s): AMMONIA in the last 168 hours. Coagulation  Profile: No results for input(s): INR, PROTIME in the last 168 hours. Cardiac Enzymes: No results for input(s): CKTOTAL, CKMB, CKMBINDEX, TROPONINI in the last 168 hours. BNP (last 3 results) No results for input(s): PROBNP in the last 8760 hours. HbA1C: No results for input(s): HGBA1C in the last 72 hours. CBG: Recent Labs  Lab 11/18/24 0720 11/18/24 1112 11/18/24 1600 11/18/24 2056 11/19/24 0811  GLUCAP 90 160* 73 171* 124*   Lipid Profile: No results for input(s): CHOL, HDL, LDLCALC, TRIG, CHOLHDL, LDLDIRECT in the last 72 hours. Thyroid  Function Tests: No  results for input(s): TSH, T4TOTAL, FREET4, T3FREE, THYROIDAB in the last 72 hours. Anemia Panel: Recent Labs    11/16/24 1603  VITAMINB12 647   Sepsis Labs: No results for input(s): PROCALCITON, LATICACIDVEN in the last 168 hours.  No results found for this or any previous visit (from the past 240 hours).        Radiology Studies: CT Angio Abd/Pel W and/or Wo Contrast Result Date: 11/17/2024 CLINICAL DATA:  Lower GI bleed.  Diarrhea with bright red blood. EXAM: CTA ABDOMEN AND PELVIS WITHOUT AND WITH CONTRAST TECHNIQUE: Multidetector CT imaging of the abdomen and pelvis was performed using the standard protocol during bolus administration of intravenous contrast. Multiplanar reconstructed images and MIPs were obtained and reviewed to evaluate the vascular anatomy. RADIATION DOSE REDUCTION: This exam was performed according to the departmental dose-optimization program which includes automated exposure control, adjustment of the mA and/or kV according to patient size and/or use of iterative reconstruction technique. CONTRAST:  OMNIPAQUE  IOHEXOL  350 MG/ML SOLN COMPARISON:  11/08/2024. FINDINGS: VASCULAR Aorta: Normal caliber aorta without aneurysm, dissection, vasculitis or significant stenosis. Aortic atherosclerosis. Celiac: Patent without evidence of aneurysm, dissection,  vasculitis or significant stenosis. SMA: Patent without evidence of aneurysm, dissection, vasculitis or significant stenosis. Renals: Both renal arteries are patent without evidence of aneurysm or dissection. There is moderate renal artery stenosis on the right. The left renal artery is patent. IMA: Patent without evidence of aneurysm, dissection, vasculitis or significant stenosis. Inflow: Patent without evidence of aneurysm, dissection, vasculitis or significant stenosis. Proximal Outflow: Bilateral common femoral and visualized portions of the superficial and profunda femoral arteries are patent without evidence of aneurysm, dissection, vasculitis or significant stenosis. Veins: No obvious venous abnormality within the limitations of this arterial phase study. Review of the MIP images confirms the above findings. NON-VASCULAR Lower chest: There is a 4 mm ground-glass nodule in the right lower lobe, axial image 34. There is a 5 mm ground-glass nodule in the right lower lobe, axial image 51. Minimal atelectasis or scarring is noted at the lung bases. Hepatobiliary: No focal liver abnormality is seen. No gallstones, gallbladder wall thickening, or biliary dilatation. Pancreas: Unremarkable. No pancreatic ductal dilatation or surrounding inflammatory changes. Spleen: Normal in size without focal abnormality. Adrenals/Urinary Tract: The adrenal glands are within normal limits. Renal atrophy is noted bilaterally. There are multiple renal cysts bilaterally. No renal calculus or hydronephrosis. The bladder is unremarkable. Stomach/Bowel: The stomach is within normal limits. No bowel obstruction, free air, or pneumatosis is seen. Evaluation for hemorrhage is limited due to the presence of oral contrast. A few scattered diverticula are noted along the colon without evidence of diverticulitis. There is persistent circumferential wall thickening involving the ascending colon with mild associated fat stranding. Appendix is  not seen. Lymphatic: Enlarged lymph nodes are noted in the right lower quadrant adjacent to the region of colonic wall thickening. Reproductive: Uterine fibroids are noted.  No adnexal mass is seen. Other: No abdominopelvic ascites. Large fat containing umbilical hernia is present. Musculoskeletal: Degenerative changes are noted in the thoracolumbar spine. No acute osseous abnormality is identified. IMPRESSION: VASCULAR 1. No evidence of active hemorrhage, however evaluation of the bowel is limited due to the presence of enteric contrast. 2. Aortic atherosclerosis. NON-VASCULAR 1. No definite evidence of acute or active hemorrhage. Evaluation is limited due to the presence of oral contrast. 2. Persistent circumferential colonic wall thickening involving the ascending colon with associated inflammation and lymphadenopathy, which may be infectious, inflammatory, or neoplastic. Correlation with  colonoscopy is recommended. 3. Diverticulosis without diverticulitis. 4. Right lower lobe ground-glass nodules measuring up to 5 mm. Follow-up by chest CT without contrast is recommended in 2 years, and again at 4 years to confirm stability. This recommendation follows the consensus statement: Recommendations for the Management of Subsolid Pulmonary Nodules Detected at CT: A Statement from the Fleischner Society as published in Radiology 2013; 266:304-317. 5. Remaining incidental findings as described above. Electronically Signed   By: Leita Birmingham M.D.   On: 11/17/2024 14:38        Scheduled Meds:  amoxicillin -clavulanate  1 tablet Oral Q2000   buPROPion   300 mg Oral Daily   [START ON 11/20/2024] calcitRIOL   2.75 mcg Oral Q T,Th,Sa-HD   [START ON 11/20/2024] cinacalcet   30 mg Oral Once per day on Tuesday Thursday Saturday   diclofenac  Sodium  2 g Topical QID   FLUoxetine   40 mg Oral Daily   insulin  aspart  0-6 Units Subcutaneous TID WC   midodrine   10 mg Oral TID WC   sodium chloride  flush  3 mL Intravenous Q12H    Continuous Infusions:     LOS: 1 day    Time spent: 45 minutes spent on chart review, discussion with nursing staff, consultants, updating family and interview/physical exam; more than 50% of that time was spent in counseling and/or coordination of care.    Harlene RAYMOND Bowl, DO [facility] Available via Epic secure chat 7am-7pm After these hours, please refer to coverage provider listed on [email or web address] 11/19/2024, 10:27 AM
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
## 2024-11-19 NOTE — Telephone Encounter (Signed)
 Noted, labs at baseline, reviewed.

## 2024-11-20 ENCOUNTER — Ambulatory Visit: Payer: Self-pay | Admitting: Family Medicine

## 2024-11-20 ENCOUNTER — Ambulatory Visit: Admitting: Gastroenterology

## 2024-11-20 LAB — RENAL FUNCTION PANEL
Albumin: 2.8 g/dL — ABNORMAL LOW (ref 3.5–5.0)
Anion gap: 16 — ABNORMAL HIGH (ref 5–15)
BUN: 33 mg/dL — ABNORMAL HIGH (ref 8–23)
CO2: 23 mmol/L (ref 22–32)
Calcium: 8.5 mg/dL — ABNORMAL LOW (ref 8.9–10.3)
Chloride: 96 mmol/L — ABNORMAL LOW (ref 98–111)
Creatinine, Ser: 7.06 mg/dL — ABNORMAL HIGH (ref 0.44–1.00)
GFR, Estimated: 6 mL/min — ABNORMAL LOW (ref >60)
Glucose, Bld: 101 mg/dL — ABNORMAL HIGH (ref 70–99)
Phosphorus: 6.3 mg/dL — ABNORMAL HIGH (ref 2.5–4.6)
Potassium: 3.2 mmol/L — ABNORMAL LOW (ref 3.5–5.1)
Sodium: 135 mmol/L (ref 135–145)

## 2024-11-20 LAB — CBC
HCT: 26.6 % — ABNORMAL LOW (ref 36.0–46.0)
Hemoglobin: 8.4 g/dL — ABNORMAL LOW (ref 12.0–15.0)
MCH: 31.5 pg (ref 26.0–34.0)
MCHC: 31.6 g/dL (ref 30.0–36.0)
MCV: 99.6 fL (ref 80.0–100.0)
Platelets: 280 K/uL (ref 150–400)
RBC: 2.67 MIL/uL — ABNORMAL LOW (ref 3.87–5.11)
RDW: 14.9 % (ref 11.5–15.5)
WBC: 12.8 K/uL — ABNORMAL HIGH (ref 4.0–10.5)
nRBC: 0 % (ref 0.0–0.2)

## 2024-11-20 LAB — GLUCOSE, CAPILLARY
Glucose-Capillary: 104 mg/dL — ABNORMAL HIGH (ref 70–99)
Glucose-Capillary: 184 mg/dL — ABNORMAL HIGH (ref 70–99)
Glucose-Capillary: 120 mg/dL — ABNORMAL HIGH (ref 70–99)

## 2024-11-20 MED ORDER — PENTAFLUOROPROP-TETRAFLUOROETH EX AERO: 1.0000 | INHALATION_SPRAY | CUTANEOUS | Status: DC | PRN

## 2024-11-20 MED ORDER — LIDOCAINE HCL (PF) 1 % IJ SOLN: 5.0000 mL | INTRAMUSCULAR | Status: DC | PRN

## 2024-11-20 MED ORDER — LIDOCAINE-PRILOCAINE 2.5-2.5 % EX CREA: 1.0000 | TOPICAL_CREAM | CUTANEOUS | Status: DC | PRN

## 2024-11-20 MED ORDER — HEPARIN SODIUM (PORCINE) 1000 UNIT/ML DIALYSIS: 1000.0000 [IU] | INTRAMUSCULAR | Status: DC | PRN

## 2024-11-20 MED ORDER — ALTEPLASE 2 MG IJ SOLR: 2.0000 mg | Freq: Once | INTRAMUSCULAR | Status: DC | PRN

## 2024-11-20 MED ORDER — ACETAMINOPHEN 325 MG PO TABS
ORAL_TABLET | ORAL | Status: AC
  Filled 2024-11-20: qty 2

## 2024-11-20 MED ORDER — ANTICOAGULANT SODIUM CITRATE 4% (200MG/5ML) IV SOLN: 5.0000 mL | Status: DC | PRN

## 2024-11-20 NOTE — Progress Notes (Signed)
 Baseline kidney function with ESRD on dialysis.  WBC improved

## 2024-11-20 NOTE — Progress Notes (Signed)
 [facility] Progress Note   Subjective:  Seen in dialysis unit. Tolerating UF so far. Feels a lot better today. Was able to eat. No abdominal pain.   Objective Vitals:   11/20/24 0735 11/20/24 0740 11/20/24 0800 11/20/24 0830  BP:  110/70 124/60 (!) 113/57  Pulse: 68 (!) 127 63 64  Resp: 18 12 14 13   Temp:      TempSrc:      SpO2: 90% 100% 99% 96%  Weight:      Height:       Physical Exam General: alert, nad  Heart: RRR Lungs: CTAB, nml WOB on RA Abdomen: soft, NTND Extremities: no LE edema Dialysis Access:  RU AVF  +bruit   Filed Weights   11/17/24 1040 11/17/24 1657  Weight: 82 kg 81.4 kg    Intake/Output Summary (Last 24 hours) at 11/20/2024 0920 Last data filed at 11/19/2024 2000 Gross per 24 hour  Intake 600 ml  Output --  Net 600 ml    Additional Objective Labs: Basic Metabolic Panel: Recent Labs  Lab 11/18/24 0836 11/19/24 0856 11/20/24 0309  NA 137 136 135  K 3.3* 3.5 3.2*  CL 97* 95* 96*  CO2 26 28 23   GLUCOSE 152* 145* 101*  BUN 18 28* 33*  CREATININE 4.00* 6.13* 7.06*  CALCIUM 8.4* 9.0 8.5*  PHOS  --   --  6.3*   Liver Function Tests: Recent Labs  Lab 11/16/24 1603 11/17/24 1047 11/20/24 0309  AST 18 24  --   ALT 38* 33  --   ALKPHOS 95 93  --   BILITOT 0.4 0.5  --   PROT 7.5 7.4  --   ALBUMIN  4.4 3.5 2.8*   CBC: Recent Labs  Lab 11/16/24 1603 11/17/24 1047 11/17/24 1543 11/18/24 0509 11/18/24 1408 11/19/24 0856 11/20/24 0309  WBC 12.9* 17.4*  --  12.8* 12.9* 12.8* 12.8*  NEUTROABS 8.4*  --   --   --  8.8*  --   --   HGB 10.6* 10.6*   < > 8.8* 8.6* 9.2* 8.4*  HCT 33.1* 34.3*   < > 27.9* 26.8* 29.3* 26.6*  MCV 96.4 99.7  --  99.3 98.5 99.0 99.6  PLT 340.0 357  --  275 276 276 280   < > = values in this interval not displayed.   CBG: Recent Labs  Lab 11/18/24 2056 11/19/24 0811 11/19/24 1159 11/19/24 1542 11/19/24 2058  GLUCAP 171* 124* 81 178* 130*    Studies/Results: No results  found.   Medications:  anticoagulant sodium citrate        amoxicillin -clavulanate  1 tablet Oral Q2000   buPROPion   300 mg Oral Daily   calcitRIOL   2.75 mcg Oral Q T,Th,Sa-HD   Chlorhexidine  Gluconate Cloth  6 each Topical Q0600   cinacalcet   30 mg Oral Once per day on Tuesday Thursday Saturday   diclofenac  Sodium  2 g Topical QID   FLUoxetine   40 mg Oral Daily   insulin  aspart  0-6 Units Subcutaneous TID WC   midodrine   10 mg Oral TID WC   sodium chloride  flush  3 mL Intravenous Q12H    Dialysis Orders: [facility] TTS 400 BF Auto 1.5 for DF 2K/2ca EDW 82.5 kg Last post weight 82.0 kg on 12/6 AVF Mircera 75 mcg every 2 weeks (last given on 12/4) Calcitriol  2.75 mcg three times a week with HD Sensipar  30 mg three times a week with HD Iron sucrose  50 mg weekly  Assessment/Plan:  1. Colitis - recurrent issue. With rectal bleeding on arrival. Now on PO Augmentin . GI following.  2. ESRD - on TTS. Next HD on 11/20/24.  3. Anemia of CKD- Hgb 8.8>9.2. . Mircera recently dosed. Continue to monitor.  4. Secondary hyperparathyroidism - Calcium in goal. Check phos. Continue home meds. 5. Hypotension - chronic issue, on midodrine .  Previously only taking with HD but now increased to TID.  6. Volume - Will increase EDW (83-83.5kg)  She understands that she needs to limit PO fluid intake as it would require increased UF with dialysis.   7. Nutrition - Renal diet w/fluid restrictions.  8. Hypokalemia -  increased K bath with HD  Maisie Fellows Willella Harding PA-C 11/20/2024,9:20 AM

## 2024-11-20 NOTE — Progress Notes (Signed)
 Received patient in bed.Alert and oriented x 4. She signed her hd treatment consent .  Access used: Right upper arm avf that worked well.  Duration of treatment: 3.5 hours.  Uf goal: Met 1.5 L  Medicine given: Tylenol  650 mg.  Hand off to the patient's nurse,back into her room with stable condition via transporter.

## 2024-11-20 NOTE — Progress Notes (Signed)
 PROGRESS NOTE    [name]  FMW:[id number] DOB: 1951-12-07 DOA: 11/17/2024 PCP: Garald Karlynn GAILS, MD    Brief Narrative:   [name] is a 73 y.o. female with medical history significant of ESRD on HD TTS, hypertension, diabetes mellitus type 2, chronic pain, arthritis, and obesity presents with complaints of rectal bleeding   Patient had just recently been hospitalized with septic shock thought secondary to right sided colitis from 11/25-12/1 requiring admission into the ICU on pressors and antibiotics.  GI had been consulted but deferred colonoscopy GI.  Pathogen panel was negative.  Vascular surgery consulted but CT angiogram without focal lesion for intervention GI thought symptoms likely secondary to ischemic colitis for which patient had in the past.  Patient completed 7 days of Zosyn  and was noted to have loose stools prior to being discharged home.  she has experienced weakness but managed at home with her sister's assistance. She attended dialysis on Tuesday without issues, and her blood pressure remained stable during the session.  Assessment and Plan: Colitis with rectal bleeding Patient presents with complaints of rectal bleeding and some mild epigastric abdominal pain. Stool guaiacs were noted to be positive.  Hemoglobin 10.6.  He was recently hospitalized with similar symptoms for which symptoms were thought most likely related to colitis.  Fecal calprotectin during last hospitalization was elevated greater than 8000.  CT scan concerning for persistent circumferential colonic wall thickening involving the ascending colon with associated inflammation and lymphadenopathy.   Patient with similar history of the same.  Initially started on empiric antibiotics of Rocephin  and metronidazole . -change to PO as lost IV access -if continues to improve will plan for d/c in AM -in HD now-- goal BP >100   Leukocytosis Acute.   -trend   Chronic hypotension Blood pressures initially  noted to be as low as 82/45.  Patient reports being recently advised to take midodrine  10 mg 3 times daily. - Continue midodrine  3 times daily   Hypokalemia -resolved   Anemia of chronic disease Hemoglobin noted to be 10.6 which appears around patient's most recent check on 12/5.  She received EPO with dialysis. - Continue to monitor H&H - Consider need of transfusion if hemoglobin drops less than 7-8  or symptomatic   ESRD on HD Labs noted potassium 3.1, CO2 28, BUN 12, creatinine 2.41.  Patient was dialyzed saturday - Nephrology notified of patient admission and recommendations in regards to more liberal fluid intake   Diabetes mellitus type 2, without long-term use of insulin  Admission glucose noted to be 216.  Last available hemoglobin A1c noted to be 6.1 - Hypoglycemia protocols - CBGs before every meal with very sensitive SSI   Depression and anxiety - Continue Wellbutrin  and Prozac    Chronic pain Patient reports having significant arthritis pain which she is on oxycodone  and had been using ibuprofen intermittently for uncontrolled pain. - Continue oxycodone  - Counseled on need of cessation of NSAID use   Prob sleep apnea -outpatient follow up  Contrast infiltration -slowly improving -elevate  -ice/heat  Obesity Estimated body mass index is 34.46 kg/m as calculated from the following:   Height as of this encounter: 5' 0.5 (1.537 m).   Weight as of this encounter: 81.4 kg.   DVT prophylaxis: SCDs Start: 11/17/24 1542    Code Status: Full Code   Disposition Plan:  Level of care: Med-Surg Status is: Observation     Consultants:  GI renal  Subjective: Abdomen continues to improve  Objective:  Vitals:   11/20/24 0800 11/20/24 0830 11/20/24 0900 11/20/24 0930  BP: 124/60 (!) 113/57 111/63 (!) 123/50  Pulse: 63 64 72 65  Resp: 14 13 18 10   Temp:      TempSrc:      SpO2: 99% 96% (!) 84% 100%  Weight:      Height:        Intake/Output Summary  (Last 24 hours) at 11/20/2024 0951 Last data filed at 11/19/2024 2000 Gross per 24 hour  Intake 600 ml  Output --  Net 600 ml   Filed Weights   11/17/24 1040 11/17/24 1657  Weight: 82 kg 81.4 kg    Examination:   General: Appearance:    Obese female in no acute distress- in HD     Lungs:     Clear to auscultation bilaterally, respirations unlabored  Heart:    Normal heart rate..    MS:   All extremities are intact.    Neurologic:   Awake, alert, oriented x 3. No apparent focal neurological           defect.        Data Reviewed: I have personally reviewed following labs and imaging studies  CBC: Recent Labs  Lab 11/16/24 1603 11/17/24 1047 11/17/24 1543 11/17/24 2142 11/18/24 0509 11/18/24 1408 11/19/24 0856 11/20/24 0309  WBC 12.9* 17.4*  --   --  12.8* 12.9* 12.8* 12.8*  NEUTROABS 8.4*  --   --   --   --  8.8*  --   --   HGB 10.6* 10.6*   < > 9.4* 8.8* 8.6* 9.2* 8.4*  HCT 33.1* 34.3*   < > 29.8* 27.9* 26.8* 29.3* 26.6*  MCV 96.4 99.7  --   --  99.3 98.5 99.0 99.6  PLT 340.0 357  --   --  275 276 276 280   < > = values in this interval not displayed.   Basic Metabolic Panel: Recent Labs  Lab 11/16/24 1603 11/17/24 1047 11/18/24 0836 11/19/24 0856 11/20/24 0309  NA 137 136 137 136 135  K 3.4* 3.1* 3.3* 3.5 3.2*  CL 90* 90* 97* 95* 96*  CO2 29 28 26 28 23   GLUCOSE 147* 214* 152* 145* 101*  BUN 35* 12 18 28* 33*  CREATININE 4.72* 2.41* 4.00* 6.13* 7.06*  CALCIUM 10.1 8.6* 8.4* 9.0 8.5*  MG 2.4  --   --   --   --   PHOS  --   --   --   --  6.3*   GFR: Estimated Creatinine Clearance: 6.9 mL/min (A) (by C-G formula based on SCr of 7.06 mg/dL (H)). Liver Function Tests: Recent Labs  Lab 11/16/24 1603 11/17/24 1047 11/20/24 0309  AST 18 24  --   ALT 38* 33  --   ALKPHOS 95 93  --   BILITOT 0.4 0.5  --   PROT 7.5 7.4  --   ALBUMIN  4.4 3.5 2.8*   No results for input(s): LIPASE, AMYLASE in the last 168 hours. No results for input(s):  AMMONIA in the last 168 hours. Coagulation Profile: No results for input(s): INR, PROTIME in the last 168 hours. Cardiac Enzymes: No results for input(s): CKTOTAL, CKMB, CKMBINDEX, TROPONINI in the last 168 hours. BNP (last 3 results) No results for input(s): PROBNP in the last 8760 hours. HbA1C: No results for input(s): HGBA1C in the last 72 hours. CBG: Recent Labs  Lab 11/18/24 2056 11/19/24 9188 11/19/24 1159 11/19/24 1542 11/19/24 7941  GLUCAP 171* 124* 81 178* 130*   Lipid Profile: No results for input(s): CHOL, HDL, LDLCALC, TRIG, CHOLHDL, LDLDIRECT in the last 72 hours. Thyroid  Function Tests: No results for input(s): TSH, T4TOTAL, FREET4, T3FREE, THYROIDAB in the last 72 hours. Anemia Panel: No results for input(s): VITAMINB12, FOLATE, FERRITIN, TIBC, IRON, RETICCTPCT in the last 72 hours.  Sepsis Labs: No results for input(s): PROCALCITON, LATICACIDVEN in the last 168 hours.  No results found for this or any previous visit (from the past 240 hours).        Radiology Studies: No results found.       Scheduled Meds:  amoxicillin -clavulanate  1 tablet Oral Q2000   buPROPion   300 mg Oral Daily   calcitRIOL   2.75 mcg Oral Q T,Th,Sa-HD   Chlorhexidine  Gluconate Cloth  6 each Topical Q0600   cinacalcet   30 mg Oral Once per day on Tuesday Thursday Saturday   diclofenac  Sodium  2 g Topical QID   FLUoxetine   40 mg Oral Daily   insulin  aspart  0-6 Units Subcutaneous TID WC   midodrine   10 mg Oral TID WC   sodium chloride  flush  3 mL Intravenous Q12H   Continuous Infusions:  anticoagulant sodium citrate         LOS: 2 days    Time spent: 45 minutes spent on chart review, discussion with nursing staff, consultants, updating family and interview/physical exam; more than 50% of that time was spent in counseling and/or coordination of care.    Harlene RAYMOND Bowl, DO [facility] Available via Epic  secure chat 7am-7pm After these hours, please refer to coverage provider listed on [email or web address] 11/20/2024, 9:51 AM

## 2024-11-20 NOTE — Plan of Care (Signed)

## 2024-11-20 NOTE — Telephone Encounter (Signed)
 Noted! Thank you

## 2024-11-21 DIAGNOSIS — K625 Hemorrhage of anus and rectum: Secondary | ICD-10-CM | POA: Diagnosis not present

## 2024-11-21 DIAGNOSIS — K529 Noninfective gastroenteritis and colitis, unspecified: Secondary | ICD-10-CM | POA: Diagnosis not present

## 2024-11-21 LAB — CBC
HCT: 28.1 % — ABNORMAL LOW (ref 36.0–46.0)
Hemoglobin: 8.9 g/dL — ABNORMAL LOW (ref 12.0–15.0)
MCH: 31.8 pg (ref 26.0–34.0)
MCHC: 31.7 g/dL (ref 30.0–36.0)
MCV: 100.4 fL — ABNORMAL HIGH (ref 80.0–100.0)
Platelets: 272 K/uL (ref 150–400)
RBC: 2.8 MIL/uL — ABNORMAL LOW (ref 3.87–5.11)
RDW: 15 % (ref 11.5–15.5)
WBC: 11.9 K/uL — ABNORMAL HIGH (ref 4.0–10.5)
nRBC: 0.2 % (ref 0.0–0.2)

## 2024-11-21 LAB — GLUCOSE, CAPILLARY
Glucose-Capillary: 192 mg/dL — ABNORMAL HIGH (ref 70–99)
Glucose-Capillary: 181 mg/dL — ABNORMAL HIGH (ref 70–99)

## 2024-11-21 MED ORDER — RISAQUAD PO CAPS: 2.0000 | ORAL_CAPSULE | Freq: Every day | ORAL | Status: AC

## 2024-11-21 MED ORDER — MIDODRINE HCL 10 MG PO TABS: 10.0000 mg | ORAL_TABLET | Freq: Three times a day (TID) | ORAL | Status: AC

## 2024-11-21 MED ORDER — RISAQUAD PO CAPS
2.0000 | ORAL_CAPSULE | Freq: Every day | ORAL | Status: DC
  Administered 2024-11-21: 2 via ORAL
  Filled 2024-11-21: qty 2

## 2024-11-21 MED ORDER — AMOXICILLIN-POT CLAVULANATE 500-125 MG PO TABS: 1.0000 | ORAL_TABLET | Freq: Every day | ORAL | 0 refills | Status: DC

## 2024-11-21 NOTE — Progress Notes (Signed)
 [facility] Progress Note   Subjective:  Completed HD yesterday. Tolerated 1.5L UF  Seen in room. Still some loose stools. Waiting to see how she feels after eating.   Objective Vitals:   11/20/24 1546 11/20/24 2045 11/20/24 2335 11/21/24 0829  BP: (!) 141/59 125/72 (!) 144/71 (!) 128/57  Pulse: 76 72 78 75  Resp: 20 19 18 18   Temp: 98 F (36.7 C) 98.1 F (36.7 C) 98 F (36.7 C) 97.8 F (36.6 C)  TempSrc: Oral Oral Oral Oral  SpO2: 100%   100%  Weight:      Height:       Physical Exam General: alert, nad  Heart: RRR Lungs: CTAB, nml WOB on RA Abdomen: soft, NTND Extremities: no LE edema Dialysis Access:  RU AVF  +bruit   Filed Weights   11/17/24 1040 11/17/24 1657  Weight: 82 kg 81.4 kg    Intake/Output Summary (Last 24 hours) at 11/21/2024 0853 Last data filed at 11/21/2024 0830 Gross per 24 hour  Intake 960 ml  Output 1500 ml  Net -540 ml    Additional Objective Labs: Basic Metabolic Panel: Recent Labs  Lab 11/18/24 0836 11/19/24 0856 11/20/24 0309  NA 137 136 135  K 3.3* 3.5 3.2*  CL 97* 95* 96*  CO2 26 28 23   GLUCOSE 152* 145* 101*  BUN 18 28* 33*  CREATININE 4.00* 6.13* 7.06*  CALCIUM 8.4* 9.0 8.5*  PHOS  --   --  6.3*   Liver Function Tests: Recent Labs  Lab 11/16/24 1603 11/17/24 1047 11/20/24 0309  AST 18 24  --   ALT 38* 33  --   ALKPHOS 95 93  --   BILITOT 0.4 0.5  --   PROT 7.5 7.4  --   ALBUMIN  4.4 3.5 2.8*   CBC: Recent Labs  Lab 11/16/24 1603 11/17/24 1047 11/18/24 0509 11/18/24 1408 11/19/24 0856 11/20/24 0309 11/21/24 0422  WBC 12.9*   < > 12.8* 12.9* 12.8* 12.8* 11.9*  NEUTROABS 8.4*  --   --  8.8*  --   --   --   HGB 10.6*   < > 8.8* 8.6* 9.2* 8.4* 8.9*  HCT 33.1*   < > 27.9* 26.8* 29.3* 26.6* 28.1*  MCV 96.4   < > 99.3 98.5 99.0 99.6 100.4*  PLT 340.0   < > 275 276 276 280 272   < > = values in this interval not displayed.   CBG: Recent Labs  Lab 11/19/24 2058 11/20/24 1154  11/20/24 1545 11/20/24 2112 11/21/24 0832  GLUCAP 130* 104* 184* 120* 192*    Studies/Results: No results found.   Medications:     acidophilus  2 capsule Oral Daily   amoxicillin -clavulanate  1 tablet Oral Q2000   buPROPion   300 mg Oral Daily   calcitRIOL   2.75 mcg Oral Q T,Th,Sa-HD   Chlorhexidine  Gluconate Cloth  6 each Topical Q0600   cinacalcet   30 mg Oral Once per day on Tuesday Thursday Saturday   diclofenac  Sodium  2 g Topical QID   FLUoxetine   40 mg Oral Daily   insulin  aspart  0-6 Units Subcutaneous TID WC   midodrine   10 mg Oral TID WC   sodium chloride  flush  3 mL Intravenous Q12H    Dialysis Orders: [facility] TTS 400 BF Auto 1.5 for DF 2K/2ca EDW 82.5 kg Last post weight 82.0 kg on 12/6 AVF Mircera 75 mcg every 2 weeks (last given on 12/4) Calcitriol  2.75 mcg three  times a week with HD Sensipar  30 mg three times a week with HD Iron sucrose  50 mg weekly  Assessment/Plan: 1. Colitis - recurrent issue. With rectal bleeding on arrival. Now on PO Augmentin . GI following.  2. ESRD - on TTS. Next HD Thurs.  3. Anemia of CKD- Hgb 8.8>9.2. . Mircera recently dosed. Continue to monitor.  4. Secondary hyperparathyroidism - Continue home meds. 5. Hypotension - chronic issue, on midodrine .  Previously only taking with HD but now increased to TID.  6. Volume - Will increase EDW (83-83.5kg)  She understands that she needs to limit PO fluid intake as it would require increased UF with dialysis.   7. Nutrition - Renal diet w/fluid restrictions.  8. Hypokalemia -  increased K bath with HD  Maisie Fellows Cordai Rodrigue PA-C 11/21/2024,8:53 AM

## 2024-11-21 NOTE — Discharge Summary (Signed)
 Physician Discharge Summary  DORELLA LASTER FMW:[id number] DOB: Aug 21, 1951 DOA: 11/17/2024  PCP: Garald Karlynn GAILS, MD  Admit date: 11/17/2024 Discharge date: 11/21/2024  Admitted From:  Discharge disposition: home   Recommendations for Outpatient Follow-Up:   Resume HD-- avoid hypotension with HD    Discharge Diagnosis:   Principal Problem:   Colitis with rectal bleeding Active Problems:   Leukocytosis   Hypotension   Hypokalemia   Anemia   ESRD on dialysis ([name])   Type 2 diabetes mellitus with other diabetic kidney complication ([name])   Anxiety and depression   Ischemic colitis   Hematochezia    Discharge Condition: Improved.  Diet recommendation: renal  Wound care: None.  Code status: Full.   History of Present Illness:   [name] is a 73 y.o. female with medical history significant of ESRD on HD TTS, hypertension, diabetes mellitus type 2, chronic pain, arthritis, and obesity presents with complaints of rectal bleeding   Patient had just recently been hospitalized with septic shock thought secondary to right sided colitis from 11/25-12/1 requiring admission into the ICU on pressors and antibiotics.  GI had been consulted but deferred colonoscopy GI.  Pathogen panel was negative.  Vascular surgery consulted but CT angiogram without focal lesion for intervention GI thought symptoms likely secondary to ischemic colitis for which patient had in the past.  Patient completed 7 days of Zosyn  and was noted to have loose stools prior to being discharged home.  she has experienced weakness but managed at home with her sister's assistance. She attended dialysis on Tuesday without issues, and her blood pressure remained stable during the session.   She had two episodes of diarrhea before leaving the hospital on Monday and two more after returning home. No bowel movements occurred until Friday, which was diarrhea but manageable. Last night, she experienced three  bowel movements with bright red blood, and two more today.  She has had some mild epigastric abdominal discomfort, but less severe than prior to her previous hospitalization.     She has a history of similar gastrointestinal bleeding episodes, with this being the third occurrence.   She is currently taking midodrine  three times a day, including before and during dialysis sessions. She also takes a phosphorus blocker, which causes constipation. She has not used Miralax  since returning home and has had a reduced appetite.   She lives alone following the passing of her mother in 2012. Her sister, who lives in [place], is her primary contact and support, especially during medical events.   No fever or chills. Reports abdominal tenderness, particularly across the top, but less severe than previous episodes. No significant swelling except for chronic left ankle swelling from prior injuries.   In the emergency department patient was noted to be hypotensive with blood pressures as low as 82/45 -127/54, respirations 18-22, pulse rate noted to be 33-93, and O2 saturations currently maintained on room air.  Labs significant for WBC 17.4, hemoglobin 10.6, potassium 3.1, BUN 12, creatinine 2.41, and anion gap 18.  Fecal occult testing was positive.   CT angiogram of the abdomen and pelvis reviewed no acute or active hemorrhage and persistent circumferential colonic wall thickening involving the ascending colon with associated inflammatory lymphadenopathy thought to be infectious, inflammatory or neoplastic in nature for which colonoscopy was recommended.      Hospital Course by Problem:   Colitis with rectal bleeding Patient presents with complaints of rectal bleeding and some mild epigastric abdominal pain.  Stool guaiacs were noted to be positive.  Hemoglobin 10.6.  He was recently hospitalized with similar symptoms for which symptoms were thought most likely related to colitis.  Fecal calprotectin during  last hospitalization was elevated greater than 8000.  CT scan concerning for persistent circumferential colonic wall thickening involving the ascending colon with associated inflammation and lymphadenopathy.   Patient with similar history of the same.  Initially started on empiric antibiotics of Rocephin  and metronidazole . -change to PO as lost IV access -in HD-- goal BP >100   Leukocytosis Acute.   -trending down   Chronic hypotension Blood pressures initially noted to be as low as 82/45.  Patient reports being recently advised to take midodrine  10 mg 3 times daily. - Continue midodrine  3 times daily   Hypokalemia -resolved   Anemia of chronic disease Hemoglobin noted to be 10.6 which appears around patient's most recent check on 12/5.  She received EPO with dialysis. -h/h stable   ESRD on HD Labs noted potassium 3.1, CO2 28, BUN 12, creatinine 2.41.  Patient was dialyzed saturday - Nephrology notified of patient admission and recommendations in regards to more liberal fluid intake   Diabetes mellitus type 2, without long-term use of insulin  Admission glucose noted to be 216.  Last available hemoglobin A1c noted to be 6.1 -resume home meds   Depression and anxiety - Continue Wellbutrin  and Prozac    Chronic pain Patient reports having significant arthritis pain which she is on oxycodone  and had been using ibuprofen intermittently for uncontrolled pain. - Continue oxycodone  - Counseled on need of cessation of NSAID use   Prob sleep apnea -outpatient follow up   Contrast infiltration - improving -elevate  -ice/heat   Obesity Estimated body mass index is 34.46 kg/m as calculated from the following:   Height as of this encounter: 5' 0.5 (1.537 m).   Weight as of this encounter: 81.4 kg.       Medical Consultants:   Renal GI   Discharge Exam:   Vitals:   11/21/24 0829 11/21/24 1208  BP: (!) 128/57 125/65  Pulse: 75 75  Resp: 18 18  Temp: 97.8 F (36.6 C)  98 F (36.7 C)  SpO2: 100% 100%   Vitals:   11/20/24 2045 11/20/24 2335 11/21/24 0829 11/21/24 1208  BP: 125/72 (!) 144/71 (!) 128/57 125/65  Pulse: 72 78 75 75  Resp: 19 18 18 18   Temp: 98.1 F (36.7 C) 98 F (36.7 C) 97.8 F (36.6 C) 98 F (36.7 C)  TempSrc: Oral Oral Oral Oral  SpO2:   100% 100%  Weight:      Height:        General exam: Appears calm and comfortable.   The results of significant diagnostics from this hospitalization (including imaging, microbiology, ancillary and laboratory) are listed below for reference.     Procedures and Diagnostic Studies:   CT Angio Abd/Pel W and/or Wo Contrast Result Date: 11/17/2024 CLINICAL DATA:  Lower GI bleed.  Diarrhea with bright red blood. EXAM: CTA ABDOMEN AND PELVIS WITHOUT AND WITH CONTRAST TECHNIQUE: Multidetector CT imaging of the abdomen and pelvis was performed using the standard protocol during bolus administration of intravenous contrast. Multiplanar reconstructed images and MIPs were obtained and reviewed to evaluate the vascular anatomy. RADIATION DOSE REDUCTION: This exam was performed according to the departmental dose-optimization program which includes automated exposure control, adjustment of the mA and/or kV according to patient size and/or use of iterative reconstruction technique. CONTRAST:  OMNIPAQUE  IOHEXOL  350  MG/ML SOLN COMPARISON:  11/08/2024. FINDINGS: VASCULAR Aorta: Normal caliber aorta without aneurysm, dissection, vasculitis or significant stenosis. Aortic atherosclerosis. Celiac: Patent without evidence of aneurysm, dissection, vasculitis or significant stenosis. SMA: Patent without evidence of aneurysm, dissection, vasculitis or significant stenosis. Renals: Both renal arteries are patent without evidence of aneurysm or dissection. There is moderate renal artery stenosis on the right. The left renal artery is patent. IMA: Patent without evidence of aneurysm, dissection, vasculitis or significant  stenosis. Inflow: Patent without evidence of aneurysm, dissection, vasculitis or significant stenosis. Proximal Outflow: Bilateral common femoral and visualized portions of the superficial and profunda femoral arteries are patent without evidence of aneurysm, dissection, vasculitis or significant stenosis. Veins: No obvious venous abnormality within the limitations of this arterial phase study. Review of the MIP images confirms the above findings. NON-VASCULAR Lower chest: There is a 4 mm ground-glass nodule in the right lower lobe, axial image 34. There is a 5 mm ground-glass nodule in the right lower lobe, axial image 51. Minimal atelectasis or scarring is noted at the lung bases. Hepatobiliary: No focal liver abnormality is seen. No gallstones, gallbladder wall thickening, or biliary dilatation. Pancreas: Unremarkable. No pancreatic ductal dilatation or surrounding inflammatory changes. Spleen: Normal in size without focal abnormality. Adrenals/Urinary Tract: The adrenal glands are within normal limits. Renal atrophy is noted bilaterally. There are multiple renal cysts bilaterally. No renal calculus or hydronephrosis. The bladder is unremarkable. Stomach/Bowel: The stomach is within normal limits. No bowel obstruction, free air, or pneumatosis is seen. Evaluation for hemorrhage is limited due to the presence of oral contrast. A few scattered diverticula are noted along the colon without evidence of diverticulitis. There is persistent circumferential wall thickening involving the ascending colon with mild associated fat stranding. Appendix is not seen. Lymphatic: Enlarged lymph nodes are noted in the right lower quadrant adjacent to the region of colonic wall thickening. Reproductive: Uterine fibroids are noted.  No adnexal mass is seen. Other: No abdominopelvic ascites. Large fat containing umbilical hernia is present. Musculoskeletal: Degenerative changes are noted in the thoracolumbar spine. No acute osseous  abnormality is identified. IMPRESSION: VASCULAR 1. No evidence of active hemorrhage, however evaluation of the bowel is limited due to the presence of enteric contrast. 2. Aortic atherosclerosis. NON-VASCULAR 1. No definite evidence of acute or active hemorrhage. Evaluation is limited due to the presence of oral contrast. 2. Persistent circumferential colonic wall thickening involving the ascending colon with associated inflammation and lymphadenopathy, which may be infectious, inflammatory, or neoplastic. Correlation with colonoscopy is recommended. 3. Diverticulosis without diverticulitis. 4. Right lower lobe ground-glass nodules measuring up to 5 mm. Follow-up by chest CT without contrast is recommended in 2 years, and again at 4 years to confirm stability. This recommendation follows the consensus statement: Recommendations for the Management of Subsolid Pulmonary Nodules Detected at CT: A Statement from the Fleischner Society as published in Radiology 2013; 266:304-317. 5. Remaining incidental findings as described above. Electronically Signed   By: Leita Birmingham M.D.   On: 11/17/2024 14:38     Labs:   Basic Metabolic Panel: Recent Labs  Lab 11/16/24 1603 11/17/24 1047 11/18/24 0836 11/19/24 0856 11/20/24 0309  NA 137 136 137 136 135  K 3.4* 3.1* 3.3* 3.5 3.2*  CL 90* 90* 97* 95* 96*  CO2 29 28 26 28 23   GLUCOSE 147* 214* 152* 145* 101*  BUN 35* 12 18 28* 33*  CREATININE 4.72* 2.41* 4.00* 6.13* 7.06*  CALCIUM 10.1 8.6* 8.4* 9.0 8.5*  MG 2.4  --   --   --   --  PHOS  --   --   --   --  6.3*   GFR Estimated Creatinine Clearance: 6.9 mL/min (A) (by C-G formula based on SCr of 7.06 mg/dL (H)). Liver Function Tests: Recent Labs  Lab 11/16/24 1603 11/17/24 1047 11/20/24 0309  AST 18 24  --   ALT 38* 33  --   ALKPHOS 95 93  --   BILITOT 0.4 0.5  --   PROT 7.5 7.4  --   ALBUMIN  4.4 3.5 2.8*   No results for input(s): LIPASE, AMYLASE in the last 168 hours. No results for  input(s): AMMONIA in the last 168 hours. Coagulation profile No results for input(s): INR, PROTIME in the last 168 hours.  CBC: Recent Labs  Lab 11/16/24 1603 11/17/24 1047 11/18/24 0509 11/18/24 1408 11/19/24 0856 11/20/24 0309 11/21/24 0422  WBC 12.9*   < > 12.8* 12.9* 12.8* 12.8* 11.9*  NEUTROABS 8.4*  --   --  8.8*  --   --   --   HGB 10.6*   < > 8.8* 8.6* 9.2* 8.4* 8.9*  HCT 33.1*   < > 27.9* 26.8* 29.3* 26.6* 28.1*  MCV 96.4   < > 99.3 98.5 99.0 99.6 100.4*  PLT 340.0   < > 275 276 276 280 272   < > = values in this interval not displayed.   Cardiac Enzymes: No results for input(s): CKTOTAL, CKMB, CKMBINDEX, TROPONINI in the last 168 hours. BNP: Invalid input(s): POCBNP CBG: Recent Labs  Lab 11/20/24 1154 11/20/24 1545 11/20/24 2112 11/21/24 0832 11/21/24 1206  GLUCAP 104* 184* 120* 192* 181*   D-Dimer No results for input(s): DDIMER in the last 72 hours. Hgb A1c No results for input(s): HGBA1C in the last 72 hours. Lipid Profile No results for input(s): CHOL, HDL, LDLCALC, TRIG, CHOLHDL, LDLDIRECT in the last 72 hours. Thyroid  function studies No results for input(s): TSH, T4TOTAL, T3FREE, THYROIDAB in the last 72 hours.  Invalid input(s): FREET3 Anemia work up No results for input(s): VITAMINB12, FOLATE, FERRITIN, TIBC, IRON, RETICCTPCT in the last 72 hours. Microbiology No results found for this or any previous visit (from the past 240 hours).   Discharge Instructions:   Discharge Instructions     Diet renal with fluid restriction   Complete by: As directed    Discharge instructions   Complete by: As directed    Avoid hypotension with HD Small frequent meals- bland   Increase activity slowly   Complete by: As directed       Allergies as of 11/21/2024       Reactions   Erythromycin  Diarrhea, Nausea And Vomiting   Gabapentin Other (See Comments)   Confusion and falling  Hallucinations         Medication List     PAUSE taking these medications    Ozempic  (0.25 or 0.5 MG/DOSE) 2 MG/3ML Sopn Wait to take this until your doctor or other care provider tells you to start again. Generic drug: Semaglutide (0.25 or 0.5MG /DOS) INJECT SUBCUTANEOUSLY 0.5 MG  EVERY WEEK       STOP taking these medications    aspirin -acetaminophen -caffeine 250-250-65 MG tablet Commonly known as: EXCEDRIN MIGRAINE       TAKE these medications    Accu-Chek Softclix Lancets lancets Use 1 lancet up to 4 times daily as directed   acetaminophen  500 MG tablet Commonly known as: TYLENOL  Take 1,000 mg by mouth 2 (two) times daily as needed for headache.   acidophilus Caps capsule Take 2 capsules  by mouth daily. Start taking on: November 22, 2024   amoxicillin -clavulanate 500-125 MG tablet Commonly known as: AUGMENTIN  Take 1 tablet by mouth daily at 8 pm.   buPROPion  300 MG 24 hr tablet Commonly known as: Wellbutrin  XL Take 1 tablet (300 mg total) by mouth in the morning.   carboxymethylcellulose 0.5 % Soln Commonly known as: REFRESH PLUS Place 1 drop into both eyes 3 (three) times daily as needed (dry eyes/irritation).   cinacalcet  30 MG tablet Commonly known as: SENSIPAR  Take 30 mg by mouth Every Tuesday,Thursday,and Saturday with dialysis.   cyclobenzaprine  10 MG tablet Commonly known as: FLEXERIL  Take 1 tablet (10 mg total) by mouth 3 (three) times daily as needed for muscle spasms.   diphenhydramine -acetaminophen  25-500 MG Tabs tablet Commonly known as: TYLENOL  PM Take 2 tablets by mouth 3 (three) times a week. At bedtime What changed:  when to take this reasons to take this additional instructions   docusate sodium  50 MG capsule Commonly known as: COLACE Take 50 mg by mouth 2 (two) times daily.   FLUoxetine  40 MG capsule Commonly known as: PROZAC  Take 1 capsule (40 mg total) by mouth in the morning.   fluticasone  50 MCG/ACT nasal spray Commonly known as:  FLONASE  Place 2 sprays into both nostrils daily.   glimepiride  1 MG tablet Commonly known as: AMARYL  Take 1 tablet (1 mg total) by mouth daily with breakfast.   ketoconazole  2 % cream Commonly known as: NIZORAL  Apply 1 Application topically daily.   lanthanum 1000 MG chewable tablet Commonly known as: FOSRENOL Chew 1,000-2,000 mg by mouth See admin instructions. Taking 2000 mg with meals and 1000 mg  with snacks   LORazepam  2 MG tablet Commonly known as: ATIVAN  Take 1 tablet (2 mg total) by mouth 2 (two) times daily as needed for anxiety.   midodrine  10 MG tablet Commonly known as: PROAMATINE  Take 1 tablet (10 mg total) by mouth in the morning, at noon, and at bedtime.   naloxone  4 MG/0.1ML Liqd nasal spray kit Commonly known as: Narcan  Take by nasal route every 3 minutes until patient awakes or EMS arrives.   oxyCODONE  15 MG immediate release tablet Commonly known as: ROXICODONE  Take 1 tablet (15 mg total) by mouth 4 (four) times daily as needed.   pantoprazole  40 MG tablet Commonly known as: PROTONIX  TAKE 1 TABLET BY MOUTH TWICE  DAILY   pilocarpine  5 MG tablet Commonly known as: SALAGEN  TAKE 1 TABLET BY MOUTH TWICE  DAILY   pioglitazone  15 MG tablet Commonly known as: ACTOS  Take 1 tablet (15 mg total) by mouth daily.   polyethylene glycol 17 g packet Commonly known as: MIRALAX  / GLYCOLAX  Take 17 g by mouth daily as needed for moderate constipation.   PreserVision AREDS 2 Caps Take 1 capsule by mouth in the morning and at bedtime.   PreviDent 5000 Dry Mouth 1.1 % Gel dental gel Generic drug: sodium fluoride Place 1 application  onto teeth in the morning and at bedtime.   RENA-VITE PO Take 1 tablet by mouth every morning.   Repatha  SureClick 140 MG/ML Soaj Generic drug: Evolocumab  INJECT 1 PEN SUBCUTANEOUSLY  EVERY 2 WEEKS          Time coordinating discharge: 45 min  Signed:  Harlene RAYMOND Bowl DO  [facility] 11/21/2024, 1:09 PM

## 2024-11-21 NOTE — Plan of Care (Signed)
 [facility] Patient Discharge Orders- [facility] CLINIC: [facility]  Patient's name: AZALEA CEDAR Admit/DC Dates: 11/17/2024 - 11/21/2024  Discharge Diagnoses: Colitis w/ rectal bleeding.  Completing PO antibiotics Hypotension. On midodrine  10 TID   Outpatient Dialysis Orders:  -Heparin : No change -EDW 83 kg   -Bath: No change  Anemia Aranesp : Given: --   Date of last dose/amount: --   PRBC's Given: -- Date/# of units: -- ESA dose for discharge: Mircera 100 q 2 weeks   Recent Labs  Lab 11/20/24 0309 11/21/24 0422  HGB 8.4* 8.9*  K 3.2*  --   CALCIUM 8.5*  --   PHOS 6.3*  --   ALBUMIN  2.8*  --    Access intervention/Change: none    Medications: -IV Antibiotics: none    OTHER/APPTS/LABS   Completed by: Maisie Ronnald Acosta PA-C   D/C Meds to be reconciled by nurse after every discharge.    Reviewed by: MD:______ RN_______

## 2024-11-21 NOTE — Plan of Care (Signed)

## 2024-11-21 NOTE — Progress Notes (Signed)
 D/C order noted. Contacted [facility] on [address] to be advised of pt's d/c today and that pt should resume care tomorrow.   Randine Mungo Dialysis Navigator [phone]

## 2024-11-22 ENCOUNTER — Telehealth: Payer: Self-pay

## 2024-11-22 ENCOUNTER — Telehealth: Payer: Self-pay | Admitting: Nephrology

## 2024-11-22 NOTE — Telephone Encounter (Signed)
 Transition of Care - Initial Contact after Hospitalization  Date of discharge:  11/21/24 Date of contact: 11/22/24  Method: Phone Spoke to: Patient  Patient contacted to discuss transition of care from recent inpatient hospitalization. Patient was admitted to [facility] from 12/6-12/10/25 with  discharge diagnosis of colitis with rectal bleeding   The discharge medication list was reviewed.   Patient will return to his/her outpatient HD unit on:  Sat 12/13  No other concerns at this time.

## 2024-11-22 NOTE — Transitions of Care (Post Inpatient/ED Visit) (Signed)
 11/22/2024  Name: [name] MRN: [id number] DOB: 05-15-1951  Today's TOC FU Call Status: Today's TOC FU Call Status:: Successful TOC FU Call Completed TOC FU Call Complete Date: 11/22/24  Patient's Name and Date of Birth confirmed. Name, DOB  Transition Care Management Follow-up Telephone Call Date of Discharge: 11/21/24 Discharge Facility: [name]) Type of Discharge: Inpatient Admission Primary Inpatient Discharge Diagnosis:: Colitis with rectal bleeding How have you been since you were released from the hospital?: Better Any questions or concerns?: No  Items Reviewed: Did you receive and understand the discharge instructions provided?: Yes Medications obtained,verified, and reconciled?: Yes (Medications Reviewed) Any new allergies since your discharge?: No Dietary orders reviewed?: Yes Type of Diet Ordered:: renal Do you have support at home?: Yes Name of Support/Comfort Primary Source: lives in [place] with an engineer, structural - lives on 3rd floor - states friends and a sister help as needed  Medications Reviewed Today: Medications Reviewed Today     Reviewed by Lauro Shona LABOR, RN (Registered Nurse) on 11/22/24 at 1243  Med List Status: <None>   Medication Order Taking? Sig Documenting Provider Last Dose Status Informant  Accu-Chek Softclix Lancets lancets [id number] Yes Use 1 lancet up to 4 times daily as directed Samtani, Jai-Gurmukh, MD  Active Self, Pharmacy Records  acetaminophen  (TYLENOL ) 500 MG tablet [id number] Yes Take 1,000 mg by mouth 2 (two) times daily as needed for headache. [provider]  Active Self, Pharmacy Records  acidophilus [facility]) CAPS capsule [id number]  Take 2 capsules by mouth daily.  Patient not taking: Reported on 11/22/2024   Vann, Jessica U, DO  Active   amoxicillin -clavulanate (AUGMENTIN ) 500-125 MG tablet [id number] Yes Take 1 tablet by mouth daily at 8 pm. Vann, Jessica U, DO  Active   B Complex-C-Folic Acid  (RENA-VITE PO)  [id number] Yes Take 1 tablet by mouth every morning. [provider]  Active Self, Pharmacy Records  buPROPion  (WELLBUTRIN  XL) 300 MG 24 hr tablet [id number] Yes Take 1 tablet (300 mg total) by mouth in the morning. Plotnikov, Aleksei V, MD  Active Self, Pharmacy Records  carboxymethylcellulose (REFRESH PLUS) 0.5 % SOLN [id number] Yes Place 1 drop into both eyes 3 (three) times daily as needed (dry eyes/irritation). [provider]  Active Self, Pharmacy Records  cinacalcet  (SENSIPAR ) 30 MG tablet [id number] Yes Take 30 mg by mouth Every Tuesday,Thursday,and Saturday with dialysis. [provider]  Active Self, Pharmacy Records  cyclobenzaprine  (FLEXERIL ) 10 MG tablet [id number] Yes Take 1 tablet (10 mg total) by mouth 3 (three) times daily as needed for muscle spasms. Plotnikov, Aleksei V, MD  Active Self, Pharmacy Records  diphenhydramine -acetaminophen  (TYLENOL  PM) 25-500 MG TABS tablet [id number] Yes Take 2 tablets by mouth 3 (three) times a week. At bedtime  Patient taking differently: Take 2 tablets by mouth at bedtime as needed (pain and sleep).   [provider]  Active Self, Pharmacy Records  docusate sodium  (COLACE) 50 MG capsule [id number] Yes Take 50 mg by mouth 2 (two) times daily. [provider]  Active Self, Pharmacy Records  FLUoxetine  (PROZAC ) 40 MG capsule [id number] Yes Take 1 capsule (40 mg total) by mouth in the morning.   Active Self, Pharmacy Records  fluticasone  (FLONASE ) 50 MCG/ACT nasal spray [id number]  Place 2 sprays into both nostrils daily.  Patient not taking: Reported on 11/22/2024   Soldatova, [place], MD  Active Self, Pharmacy Records  glimepiride  (AMARYL ) 1 MG tablet [id number] Yes Take 1 tablet (1 mg total) by mouth daily with  breakfast. Plotnikov, Aleksei V, MD  Active Self, Pharmacy Records  ketoconazole  (NIZORAL ) 2 % cream [id number] Yes Apply 1 Application topically daily. Plotnikov, Aleksei V, MD  Active Self, Pharmacy Records   lanthanum [facility]) 1000 MG chewable tablet [id number] Yes Chew 1,000-2,000 mg by mouth See admin instructions. Taking 2000 mg with meals and 1000 mg  with snacks [provider]  Active Self, Pharmacy Records  LORazepam  (ATIVAN ) 2 MG tablet [id number] Yes Take 1 tablet (2 mg total) by mouth 2 (two) times daily as needed for anxiety.   Active Self, Pharmacy Records  midodrine  (PROAMATINE ) 10 MG tablet [id number] Yes Take 1 tablet (10 mg total) by mouth in the morning, at noon, and at bedtime. Vann, Jessica U, DO  Active   Multiple Vitamins-Minerals (PRESERVISION AREDS 2) CAPS [id number] Yes Take 1 capsule by mouth in the morning and at bedtime. [provider]  Active Self, Pharmacy Records  naloxone  (NARCAN ) nasal spray 4 mg/0.1 mL [id number] Yes Take by nasal route every 3 minutes until patient awakes or EMS arrives.   Active Self, Pharmacy Records           Med Note STEFFI, ADELITA   Wed Nov 07, 2024 11:34 AM)    oxyCODONE  (ROXICODONE ) 15 MG immediate release tablet [id number] Yes Take 1 tablet (15 mg total) by mouth 4 (four) times daily as needed.   Active Self, Pharmacy Records  pantoprazole  (PROTONIX ) 40 MG tablet [id number] Yes TAKE 1 TABLET BY MOUTH TWICE  DAILY Abran Norleen SAILOR, MD  Active Self, Pharmacy Records  pilocarpine  (SALAGEN ) 5 MG tablet [id number] Yes TAKE 1 TABLET BY MOUTH TWICE  DAILY Plotnikov, Aleksei V, MD  Active Self, Pharmacy Records  pioglitazone  (ACTOS ) 15 MG tablet [id number] Yes Take 1 tablet (15 mg total) by mouth daily. Plotnikov, Aleksei V, MD  Active Self, Pharmacy Records  polyethylene glycol (MIRALAX  / GLYCOLAX ) 17 g packet [id number] Yes Take 17 g by mouth daily as needed for moderate constipation. [provider]  Active Self, Pharmacy Records           Med Note KERRIN, MELISSA R   Thu Feb 02, 2024  2:01 PM)    PREVIDENT 5000 DRY MOUTH 1.1 % GEL dental gel [id number] Yes Place 1 application  onto teeth in the morning and at bedtime.  [provider]  Active Self, Pharmacy Records  REPATHA  SURECLICK 140 MG/ML EMMANUEL [id number] Yes INJECT 1 PEN SUBCUTANEOUSLY  EVERY 2 WEEKS Croitoru, Mihai, MD  Active Self, Pharmacy Records  Semaglutide ,0.25 or 0.5MG /DOS, (OZEMPIC , 0.25 OR 0.5 MG/DOSE,) 2 MG/3ML SOPN [id number]  INJECT SUBCUTANEOUSLY 0.5 MG  EVERY WEEK  Patient not taking: Reported on 11/22/2024   Plotnikov, Karlynn GAILS, MD  Active Self, Pharmacy Records  Med List Note Mylo Powell CROME, CPhT 09/14/21 1722): Dialysis Tuesday, Thursday, Saturday - [facility].            Home Care and Equipment/Supplies: Were Home Health Services Ordered?: No Any new equipment or medical supplies ordered?: No  Functional Questionnaire: Do you need assistance with bathing/showering or dressing?: No Do you need assistance with meal preparation?: No Do you need assistance with eating?: No Do you have difficulty maintaining continence: Yes (occasional bowel incontinence related to colitis - incontience currently improved) Do you need assistance with getting out of bed/getting out of a chair/moving?: No Do you have difficulty managing or taking your medications?: No  Follow up appointments reviewed: Date of PCP follow-up appointment?: 11/27/24 Follow-up Provider: Corean Ku with PCP  office Specialist Hospital Follow-up appointment confirmed?: Yes Date of Specialist follow-up appointment?: 12/07/24 Follow-Up Specialty Provider:: Gastroenterology, Abran Norleen SAILOR, MD Do you need transportation to your follow-up appointment?: No Do you understand care options if your condition(s) worsen?: Yes-patient verbalized understanding  SDOH Interventions Today    Flowsheet Row Most Recent Value  SDOH Interventions   Food Insecurity Interventions Intervention Not Indicated  Housing Interventions Intervention Not Indicated  Transportation Interventions Intervention Not Indicated  Utilities Interventions Intervention Not Indicated     Goals Addressed             This Visit's Progress    VBCI Transitions of Care (TOC) Care Plan       Problems:  Prior Hospitalization for treatment of Septic shock due to right sided colitis. Patient readmitted to hospital Admit/Discharge Date: 12/6 - 12/10 [name] Primary Diagnosis: Colitis with rectal bleeding  Goal:  Over the next 30 days, the patient will not experience hospital readmission  Interventions:  Transitions of Care: Doctor Visits  - discussed the importance of doctor visits Reviewed Signs and symptoms of infection Reviewed Dialysis Days - Tu, Th, Sat - patient states no urine output  TOC RN scheduled patient's hospital f/u with PCP  Reviewed with patient instruction to avoid hypotension with HD - patient aware   Patient Self Care Activities:  Attend all scheduled provider appointments Call pharmacy for medication refills 3-7 days in advance of running out of medications Call provider office for new concerns or questions  Notify RN Care Manager of [facility] call rescheduling needs Participate in Transition of Care Program/Attend TOC scheduled calls Take medications as prescribed   Follow up appt with Dr Norleen Abran 12/07/24  Notify provider with return of diarrhea or abdominal pain, or fever  Notify provider with any falls Keep hospital follow up appt with PCP 11/27/24 Monitor for any rectal bleeding and notify provider    Plan:  Telephone follow up appointment with care management team member scheduled for:  12/18/25pm The patient has been provided with contact information for the care management team and has been advised to call with any health related questions or concerns.          Shona Prow RN, CCM [place]  VBCI-Population Health RN Care Manager [phone]

## 2024-11-27 ENCOUNTER — Inpatient Hospital Stay: Admitting: Family Medicine

## 2024-11-29 ENCOUNTER — Encounter (HOSPITAL_COMMUNITY): Payer: Self-pay

## 2024-11-29 ENCOUNTER — Other Ambulatory Visit: Payer: Self-pay

## 2024-11-29 ENCOUNTER — Emergency Department (HOSPITAL_COMMUNITY)

## 2024-11-29 ENCOUNTER — Inpatient Hospital Stay (HOSPITAL_COMMUNITY)
Admission: EM | Admit: 2024-11-29 | Discharge: 2024-12-03 | DRG: 385 | Disposition: A | Discharge status: Home / Self Care

## 2024-11-29 ENCOUNTER — Inpatient Hospital Stay: Admitting: Family Medicine

## 2024-11-29 ENCOUNTER — Telehealth: Payer: Self-pay | Admitting: Family Medicine

## 2024-11-29 DIAGNOSIS — K5731 Diverticulosis of large intestine without perforation or abscess with bleeding: Secondary | ICD-10-CM | POA: Diagnosis present

## 2024-11-29 DIAGNOSIS — K625 Hemorrhage of anus and rectum: Secondary | ICD-10-CM | POA: Diagnosis not present

## 2024-11-29 DIAGNOSIS — Z83438 Family history of other disorder of lipoprotein metabolism and other lipidemia: Secondary | ICD-10-CM

## 2024-11-29 DIAGNOSIS — I9589 Other hypotension: Secondary | ICD-10-CM | POA: Diagnosis present

## 2024-11-29 DIAGNOSIS — E1122 Type 2 diabetes mellitus with diabetic chronic kidney disease: Secondary | ICD-10-CM | POA: Diagnosis present

## 2024-11-29 DIAGNOSIS — Z7985 Long-term (current) use of injectable non-insulin antidiabetic drugs: Secondary | ICD-10-CM

## 2024-11-29 DIAGNOSIS — R1084 Generalized abdominal pain: Secondary | ICD-10-CM

## 2024-11-29 DIAGNOSIS — D638 Anemia in other chronic diseases classified elsewhere: Secondary | ICD-10-CM | POA: Diagnosis present

## 2024-11-29 DIAGNOSIS — Z79899 Other long term (current) drug therapy: Secondary | ICD-10-CM

## 2024-11-29 DIAGNOSIS — Z7984 Long term (current) use of oral hypoglycemic drugs: Secondary | ICD-10-CM

## 2024-11-29 DIAGNOSIS — K551 Chronic vascular disorders of intestine: Secondary | ICD-10-CM | POA: Diagnosis present

## 2024-11-29 DIAGNOSIS — F32A Depression, unspecified: Secondary | ICD-10-CM | POA: Diagnosis present

## 2024-11-29 DIAGNOSIS — K219 Gastro-esophageal reflux disease without esophagitis: Secondary | ICD-10-CM | POA: Diagnosis present

## 2024-11-29 DIAGNOSIS — Z66 Do not resuscitate: Secondary | ICD-10-CM | POA: Diagnosis present

## 2024-11-29 DIAGNOSIS — Z823 Family history of stroke: Secondary | ICD-10-CM

## 2024-11-29 DIAGNOSIS — D631 Anemia in chronic kidney disease: Secondary | ICD-10-CM | POA: Diagnosis present

## 2024-11-29 DIAGNOSIS — D62 Acute posthemorrhagic anemia: Secondary | ICD-10-CM | POA: Diagnosis not present

## 2024-11-29 DIAGNOSIS — Z8719 Personal history of other diseases of the digestive system: Secondary | ICD-10-CM

## 2024-11-29 DIAGNOSIS — K529 Noninfective gastroenteritis and colitis, unspecified: Secondary | ICD-10-CM | POA: Diagnosis present

## 2024-11-29 DIAGNOSIS — K51811 Other ulcerative colitis with rectal bleeding: Principal | ICD-10-CM | POA: Diagnosis present

## 2024-11-29 DIAGNOSIS — Z992 Dependence on renal dialysis: Secondary | ICD-10-CM

## 2024-11-29 DIAGNOSIS — G894 Chronic pain syndrome: Secondary | ICD-10-CM | POA: Diagnosis present

## 2024-11-29 DIAGNOSIS — Z833 Family history of diabetes mellitus: Secondary | ICD-10-CM

## 2024-11-29 DIAGNOSIS — Z96611 Presence of right artificial shoulder joint: Secondary | ICD-10-CM | POA: Diagnosis present

## 2024-11-29 DIAGNOSIS — Z96651 Presence of right artificial knee joint: Secondary | ICD-10-CM | POA: Diagnosis present

## 2024-11-29 DIAGNOSIS — M797 Fibromyalgia: Secondary | ICD-10-CM | POA: Diagnosis present

## 2024-11-29 DIAGNOSIS — Z888 Allergy status to other drugs, medicaments and biological substances status: Secondary | ICD-10-CM

## 2024-11-29 DIAGNOSIS — Z604 Social exclusion and rejection: Secondary | ICD-10-CM | POA: Diagnosis present

## 2024-11-29 DIAGNOSIS — I120 Hypertensive chronic kidney disease with stage 5 chronic kidney disease or end stage renal disease: Secondary | ICD-10-CM | POA: Diagnosis present

## 2024-11-29 DIAGNOSIS — N2581 Secondary hyperparathyroidism of renal origin: Secondary | ICD-10-CM | POA: Diagnosis present

## 2024-11-29 DIAGNOSIS — E1142 Type 2 diabetes mellitus with diabetic polyneuropathy: Secondary | ICD-10-CM | POA: Diagnosis present

## 2024-11-29 DIAGNOSIS — K429 Umbilical hernia without obstruction or gangrene: Secondary | ICD-10-CM | POA: Diagnosis present

## 2024-11-29 DIAGNOSIS — Z8249 Family history of ischemic heart disease and other diseases of the circulatory system: Secondary | ICD-10-CM

## 2024-11-29 DIAGNOSIS — F419 Anxiety disorder, unspecified: Secondary | ICD-10-CM | POA: Diagnosis present

## 2024-11-29 DIAGNOSIS — N186 End stage renal disease: Secondary | ICD-10-CM | POA: Diagnosis present

## 2024-11-29 DIAGNOSIS — Z87891 Personal history of nicotine dependence: Secondary | ICD-10-CM

## 2024-11-29 DIAGNOSIS — R109 Unspecified abdominal pain: Secondary | ICD-10-CM

## 2024-11-29 LAB — COMPREHENSIVE METABOLIC PANEL WITH GFR
ALT: 12 U/L (ref 0–44)
AST: 19 U/L (ref 15–41)
Albumin: 4.2 g/dL (ref 3.5–5.0)
Alkaline Phosphatase: 128 U/L — ABNORMAL HIGH (ref 38–126)
Anion gap: 18 — ABNORMAL HIGH (ref 5–15)
BUN: 11 mg/dL (ref 8–23)
CO2: 28 mmol/L (ref 22–32)
Calcium: 8.7 mg/dL — ABNORMAL LOW (ref 8.9–10.3)
Chloride: 91 mmol/L — ABNORMAL LOW (ref 98–111)
Creatinine, Ser: 2.2 mg/dL — ABNORMAL HIGH (ref 0.44–1.00)
GFR, Estimated: 23 mL/min — ABNORMAL LOW (ref >60)
Glucose, Bld: 188 mg/dL — ABNORMAL HIGH (ref 70–99)
Potassium: 3.7 mmol/L (ref 3.5–5.1)
Sodium: 137 mmol/L (ref 135–145)
Total Bilirubin: 0.3 mg/dL (ref 0.0–1.2)
Total Protein: 7.3 g/dL (ref 6.5–8.1)

## 2024-11-29 LAB — CBC
HCT: 28.9 % — ABNORMAL LOW (ref 36.0–46.0)
Hemoglobin: 8.8 g/dL — ABNORMAL LOW (ref 12.0–15.0)
MCH: 31.4 pg (ref 26.0–34.0)
MCHC: 30.4 g/dL (ref 30.0–36.0)
MCV: 103.2 fL — ABNORMAL HIGH (ref 80.0–100.0)
Platelets: 348 K/uL (ref 150–400)
RBC: 2.8 MIL/uL — ABNORMAL LOW (ref 3.87–5.11)
RDW: 15.5 % (ref 11.5–15.5)
WBC: 13.5 K/uL — ABNORMAL HIGH (ref 4.0–10.5)
nRBC: 0 % (ref 0.0–0.2)

## 2024-11-29 LAB — CBG MONITORING, ED
Glucose-Capillary: 121 mg/dL — ABNORMAL HIGH (ref 70–99)
Glucose-Capillary: 75 mg/dL (ref 70–99)

## 2024-11-29 LAB — HEMOGLOBIN AND HEMATOCRIT, BLOOD
HCT: 25.5 % — ABNORMAL LOW (ref 36.0–46.0)
Hemoglobin: 7.6 g/dL — ABNORMAL LOW (ref 12.0–15.0)

## 2024-11-29 LAB — POC OCCULT BLOOD, ED: Fecal Occult Bld: POSITIVE — AB

## 2024-11-29 LAB — LIPASE, BLOOD: Lipase: 19 U/L (ref 11–51)

## 2024-11-29 MED ORDER — ONDANSETRON HCL 4 MG/2ML IJ SOLN: 4.0000 mg | Freq: Four times a day (QID) | INTRAMUSCULAR | Status: DC | PRN

## 2024-11-29 MED ORDER — PANTOPRAZOLE SODIUM 40 MG PO TBEC
40.0000 mg | DELAYED_RELEASE_TABLET | Freq: Two times a day (BID) | ORAL | Status: DC
  Administered 2024-11-29 – 2024-12-03 (×8): 40 mg via ORAL
  Filled 2024-11-29 (×8): qty 1
  Filled 2024-11-29: qty 2

## 2024-11-29 MED ORDER — ACETAMINOPHEN 325 MG PO TABS
650.0000 mg | ORAL_TABLET | Freq: Four times a day (QID) | ORAL | Status: DC | PRN
  Administered 2024-12-01 – 2024-12-02 (×4): 650 mg via ORAL
  Filled 2024-11-29 (×4): qty 2

## 2024-11-29 MED ORDER — ONDANSETRON HCL 4 MG PO TABS: 4.0000 mg | ORAL_TABLET | Freq: Four times a day (QID) | ORAL | Status: DC | PRN

## 2024-11-29 MED ORDER — CYCLOBENZAPRINE HCL 10 MG PO TABS
10.0000 mg | ORAL_TABLET | Freq: Three times a day (TID) | ORAL | Status: DC | PRN
  Administered 2024-12-01 – 2024-12-02 (×3): 10 mg via ORAL
  Filled 2024-11-29 (×4): qty 1

## 2024-11-29 MED ORDER — SODIUM CHLORIDE 0.9% FLUSH
3.0000 mL | Freq: Two times a day (BID) | INTRAVENOUS | Status: DC
  Administered 2024-11-29: 21:00:00 3 mL via INTRAVENOUS

## 2024-11-29 MED ORDER — INSULIN ASPART 100 UNIT/ML IJ SOLN
0.0000 [IU] | INTRAMUSCULAR | Status: DC
  Administered 2024-11-29 – 2024-11-30 (×4): 1 [IU] via SUBCUTANEOUS
  Administered 2024-12-01: 2 [IU] via SUBCUTANEOUS
  Administered 2024-12-01: 5 [IU] via SUBCUTANEOUS
  Administered 2024-12-01 – 2024-12-03 (×3): 1 [IU] via SUBCUTANEOUS
  Administered 2024-12-03: 2 [IU] via SUBCUTANEOUS
  Administered 2024-12-03: 1 [IU] via SUBCUTANEOUS
  Filled 2024-11-29 (×4): qty 1
  Filled 2024-11-29: qty 2
  Filled 2024-11-29 (×2): qty 1
  Filled 2024-11-29: qty 5
  Filled 2024-11-29: qty 1
  Filled 2024-11-29: qty 2

## 2024-11-29 MED ORDER — POLYETHYLENE GLYCOL 3350 17 G PO PACK: 17.0000 g | PACK | Freq: Every day | ORAL | Status: DC | PRN

## 2024-11-29 MED ORDER — OXYCODONE HCL 5 MG PO TABS
15.0000 mg | ORAL_TABLET | Freq: Four times a day (QID) | ORAL | Status: DC | PRN
  Administered 2024-12-01 – 2024-12-02 (×4): 15 mg via ORAL
  Filled 2024-11-29 (×5): qty 3

## 2024-11-29 MED ORDER — MIDODRINE HCL 5 MG PO TABS
10.0000 mg | ORAL_TABLET | Freq: Three times a day (TID) | ORAL | Status: DC
  Administered 2024-11-29 – 2024-12-03 (×10): 10 mg via ORAL
  Filled 2024-11-29 (×10): qty 2

## 2024-11-29 MED ORDER — CINACALCET HCL 30 MG PO TABS
30.0000 mg | ORAL_TABLET | ORAL | Status: DC
  Administered 2024-12-01: 30 mg via ORAL
  Filled 2024-11-29: qty 1

## 2024-11-29 MED ORDER — LORAZEPAM 1 MG PO TABS: 2.0000 mg | ORAL_TABLET | Freq: Two times a day (BID) | ORAL | Status: DC | PRN

## 2024-11-29 MED ORDER — BUPROPION HCL ER (XL) 150 MG PO TB24
300.0000 mg | ORAL_TABLET | Freq: Every morning | ORAL | Status: DC
  Administered 2024-11-30 – 2024-12-03 (×4): 300 mg via ORAL
  Filled 2024-11-29 (×4): qty 2

## 2024-11-29 MED ORDER — ACETAMINOPHEN 650 MG RE SUPP: 650.0000 mg | Freq: Four times a day (QID) | RECTAL | Status: DC | PRN

## 2024-11-29 MED ORDER — IOHEXOL 350 MG/ML SOLN
85.0000 mL | Freq: Once | INTRAVENOUS | Status: AC | PRN
  Administered 2024-11-29: 16:00:00 85 mL via INTRAVENOUS

## 2024-11-29 MED ORDER — SENNOSIDES-DOCUSATE SODIUM 8.6-50 MG PO TABS: 1.0000 | ORAL_TABLET | Freq: Every evening | ORAL | Status: DC | PRN

## 2024-11-29 MED ORDER — FLUOXETINE HCL 20 MG PO CAPS
40.0000 mg | ORAL_CAPSULE | Freq: Every morning | ORAL | Status: DC
  Administered 2024-11-30 – 2024-12-03 (×4): 40 mg via ORAL
  Filled 2024-11-29 (×4): qty 2

## 2024-11-29 NOTE — Progress Notes (Deleted)
° °  Acute Office Visit  Subjective:     Patient ID: [name], female    DOB: Apr 06, 1951, 73 y.o.   MRN: [id number]  No chief complaint on file.   HPI  Discussed the use of AI scribe software for clinical note transcription with the patient, who gave verbal consent to proceed.  HFU  History of Present Illness      ROS Per HPI      Objective:    There were no vitals taken for this visit.   Physical Exam Vitals and nursing note reviewed.  Constitutional:      General: She is not in acute distress.    Appearance: Normal appearance. She is normal weight.  HENT:     Head: Normocephalic and atraumatic.     Right Ear: External ear normal.     Left Ear: External ear normal.     Nose: Nose normal.     Mouth/Throat:     Mouth: Mucous membranes are moist.     Pharynx: Oropharynx is clear.  Eyes:     Extraocular Movements: Extraocular movements intact.     Pupils: Pupils are equal, round, and reactive to light.  Cardiovascular:     Rate and Rhythm: Normal rate and regular rhythm.     Pulses: Normal pulses.     Heart sounds: Normal heart sounds.  Pulmonary:     Effort: Pulmonary effort is normal. No respiratory distress.     Breath sounds: Normal breath sounds. No wheezing, rhonchi or rales.  Musculoskeletal:        General: Normal range of motion.     Cervical back: Normal range of motion.     Right lower leg: No edema.     Left lower leg: No edema.  Lymphadenopathy:     Cervical: No cervical adenopathy.  Neurological:     General: No focal deficit present.     Mental Status: She is alert and oriented to person, place, and time.  Psychiatric:        Mood and Affect: Mood normal.        Thought Content: Thought content normal.     No results found for any visits on 11/29/24.      Assessment & Plan:   Assessment and Plan Assessment & Plan      No orders of the defined types were placed in this encounter.    No orders of the defined types were  placed in this encounter.   No follow-ups on file.  Corean LITTIE Ku, FNP

## 2024-11-29 NOTE — Hospital Course (Signed)
 [name] is a 73 y.o. female with medical history significant for ESRD on TTS HD, T2DM, chronic hypotension, anemia of chronic disease, depression/anxiety, chronic pain, and history of recurrent ischemic colitis who is admitted with recurrent rectal bleeding in setting of persistent colitis.

## 2024-11-29 NOTE — ED Provider Triage Note (Signed)
 Emergency Medicine Provider Triage Evaluation Note  [name] , a 73 y.o. female  was evaluated in triage.  Pt complains of episode of stool today with bright red blood and numerous clots after dialysis.  Patient was recently admitted for GI bleed and diagnosed with ischemic bowel.  Patient reports significant pain today during dialysis then episode of bright red blood with diarrhea.  Review of Systems  Positive: Bloody diarrhea and severe abdominal pain Negative: Shortness of breath, chest pain, dizziness  Physical Exam  BP (!) 143/51 (BP Location: Left Arm)   Pulse 96   Temp (!) 97.5 F (36.4 C)   Resp (!) 24   Ht 5' 0.5 (1.537 m)   Wt 82.4 kg   SpO2 99%   BMI 34.89 kg/m  Gen:   Awake, no distress   Resp:  Normal effort  MSK:   Moves extremities without difficulty  Other:  Bloody diarrhea reported with several clots  Medical Decision Making  Medically screening exam initiated at 2:42 PM.  Appropriate orders placed.  [name] was informed that the remainder of the evaluation will be completed by another provider, this initial triage assessment does not replace that evaluation, and the importance of remaining in the ED until their evaluation is complete.  CT angio abdomen pelvis will be repeated with diagnosis of chronic ischemic bowel.   Myriam Fonda RAMAN, [place] 11/29/24 1455

## 2024-11-29 NOTE — ED Triage Notes (Signed)
 Pt to er, pt states that she is here for rectal bleeding, states that she was discharged on Monday for the same, states that she was supposed to have dialysis today and they were late, states that when she got home her depends had lots of blood.

## 2024-11-29 NOTE — H&P (Signed)
 History and Physical    [name] FMW:[id number] DOB: [birth year] DOA: 11/29/2024  PCP: Garald Anita GAILS, MD  Patient coming from: Home  I have personally briefly reviewed patient's old medical records in [facility]  Chief Complaint: Rectal bleeding  HPI: [name] is a 73 y.o. female with medical history significant for ESRD on TTS HD, T2DM, chronic hypotension, anemia of chronic disease, depression/anxiety, chronic pain, and history of recurrent ischemic colitis who presented to the ED for evaluation of rectal bleeding.  Patient with 2 recent admissions for rectal bleeding 11/25-12/1 for septic shock thought secondary to right-sided colitis.  Patient was seen by GI and vascular surgery, it was felt this was secondary to ischemic colitis which patient had in the past as well.  Colonoscopy was deferred.  She completed 7 days of Zosyn  in hospital and ultimately discharged home.  12/6-12/10 for recurrent rectal bleeding.  Repeat CT showed persistent circumferential colonic wall thickening involving ascending colon with associated information lymphadenopathy.  She was managed with IV ceftriaxone  and Flagyl .  She was seen by GI who felt colonoscopy was not indicated at the time.  Patient was discharged home to complete antibiotics with oral Augmentin .  Patient states she was doing well when she left the hospital.  She reports chronic issues with constipation and has not been having regular bowel movements but takes MiraLAX  to try to help with this.  Yesterday she developed recurrent bright red blood per rectum.  She says that she had 4 episodes.  She went to her usual dialysis session today.  She says while in the waiting room she developed some upper abdominal pain.  After completing her dialysis and getting into her car she had a large amount of bright red rectal bleeding that filled up her depends.  She says she did feel a little lightheaded when she stood up but did not pass  out.  Last colonoscopy in March 2023 showed severe right-sided colitis.  Biopsies consistent with ischemia.  It was felt ischemic colitis may have been from hypotensive events during dialysis versus potentially associated with phosphate binder use as sevelamer  crystals were seen on microscopic analysis.  ED Course  Labs/Imaging on admission: I have personally reviewed following labs and imaging studies.  Initial vitals showed BP 143/51, pulse 96, RR 24, temp 97.5 F, SpO2 99% on room air.  Labs showed WBC 13.5, hemoglobin 8.8, platelets 348, sodium 137, potassium 3.7, bicarb 28, BUN 11, creatinine 2.20, serum glucose 188, AST 19, ALT 12, alk phos 128, total bilirubin 0.3, lipase 19.  FOBT is positive.  CTA abdomen/pelvis showed persistent moderate narrowing involving the cecum which may represent focal inflammation or possibly colonic malignancy.  Mildly enlarged mesenteric lymph nodes noted in the right lower quadrant.  Large amount of residual contrast noted throughout the colon preventing evaluation for active GI bleeding.  Moderate-sized fat-containing periumbilical hernia also noted.  EDP discussed with on-call for [facility] (Dr. Leigh) who recommended medical admission and their team will see in consultation for potential colonoscopy.  The hospitalist service was consulted for admission.  Review of Systems: All systems reviewed and are negative except as documented in history of present illness above.   Past Medical History:  Diagnosis Date   Anemia    Anxiety    Brain tumor (benign) ([name])    Colitis 2010   microscopic- Dr Abran   Depression    Diabetes mellitus    type II   Diverticulosis  Dyspnea    with exertion   ESRD (end stage renal disease) ([name])    TTUSAT [address]    Fibromyalgia    GERD (gastroesophageal reflux disease)    Headache    Hemodialysis patient    History of blood transfusion    after knee surgery   Hypertension    discontinued all  diuretics and antihypertensives   IBS (irritable bowel syndrome)    LBP (low back pain)    Neuropathy    feet bilat    Osteoarthritis    Osteopenia    Pneumonia    hx of 2014    Rotator cuff tear, right    Sinusitis    currently being treated with antibiotic will complete 03/04/2015    Past Surgical History:  Procedure Laterality Date   A/V FISTULAGRAM N/A 02/03/2024   Procedure: A/V Fistulagram;  Surgeon: Norine Manuelita LABOR, MD;  Location: [facility];  Service: Cardiovascular;  Laterality: N/A;   A/V SHUNT INTERVENTION Right 05/30/2024   Procedure: A/V SHUNT INTERVENTION;  Surgeon: Pearline Norman RAMAN, MD;  Location: [facility];  Service: Cardiovascular;  Laterality: Right;   A/V SHUNT INTERVENTION N/A 09/07/2024   Procedure: A/V SHUNT INTERVENTION;  Surgeon: Lanis Fonda BRAVO, MD;  Location: [facility];  Service: Cardiovascular;  Laterality: N/A;   AV FISTULA PLACEMENT Right 09/12/2020   Procedure: RIGHT ARTERIOVENOUS (AV) FISTULA CREATION;  Surgeon: Harvey Carlin BRAVO, MD;  Location: [facility] OR;  Service: Vascular;  Laterality: Right;   BASCILIC VEIN TRANSPOSITION Right 10/27/2020   Procedure: RIGHT UPPER EXTREMITY SECOND STAGE BASCILIC VEIN TRANSPOSITION;  Surgeon: Harvey Carlin BRAVO, MD;  Location: [facility] OR;  Service: Vascular;  Laterality: Right;   BIOPSY  03/11/2022   Procedure: BIOPSY;  Surgeon: Wilhelmenia Aloha Raddle., MD;  Location: [facility] ENDOSCOPY;  Service: Gastroenterology;;   COLONOSCOPY WITH PROPOFOL  N/A 03/11/2022   Procedure: COLONOSCOPY WITH PROPOFOL ;  Surgeon: Wilhelmenia Aloha Raddle., MD;  Location: [facility] ENDOSCOPY;  Service: Gastroenterology;  Laterality: N/A;   foramen magnum ependymoma surgery  2003   Dr Mora   JOINT REPLACEMENT Bilateral    NASAL SINUS SURGERY     1973    PERIPHERAL VASCULAR BALLOON ANGIOPLASTY  02/03/2024   Procedure: PERIPHERAL VASCULAR BALLOON ANGIOPLASTY;  Surgeon: Norine Manuelita LABOR, MD;  Location: [facility];  Service: Cardiovascular;;  Inflow  Basilic Vein   REVERSE SHOULDER ARTHROPLASTY Right 06/03/2022   Procedure: REVERSE SHOULDER ARTHROPLASTY;  Surgeon: Melita Drivers, MD;  Location: [facility] ORS;  Service: Orthopedics;  Laterality: Right;    TONSILLECTOMY     TOTAL KNEE ARTHROPLASTY     L 2008, R 2009, R 2016- Dr hiram   TOTAL KNEE REVISION Right 03/05/2015   Procedure: RIGHT TOTAL KNEE ARTHROPLASTY REVISION;  Surgeon: Dempsey Moan, MD;  Location: [facility] ORS;  Service: Orthopedics;  Laterality: Right;   VENOUS ANGIOPLASTY  05/30/2024   Procedure: VENOUS ANGIOPLASTY;  Surgeon: Pearline Norman RAMAN, MD;  Location: [facility];  Service: Cardiovascular;;  central   VENOUS ANGIOPLASTY  09/07/2024   Procedure: VENOUS ANGIOPLASTY;  Surgeon: Lanis Fonda BRAVO, MD;  Location: [facility];  Service: Cardiovascular;;  innominate 70%    Social History: Social History[1]  Allergies[2]  Family History  Problem Relation Age of Onset   Depression Mother    Hypertension Mother    Stroke Mother 42   Diabetes Mother    Diabetes Father    Hyperlipidemia Father    Hypertension Father    Crohn's disease  Maternal Uncle    Diabetes Brother    Heart disease Brother    Hyperlipidemia Brother    Hypertension Brother    Prostate cancer Brother    Diabetes Brother    Heart disease Brother        Heart Disease before age 45   Heart attack Brother    Stroke Brother        X's 2   Hyperlipidemia Brother    Hypertension Brother    Diabetes Other    Colon cancer Neg Hx    Esophageal cancer Neg Hx    Stomach cancer Neg Hx    Pancreatic cancer Neg Hx    Breast cancer Neg Hx    BRCA 1/2 Neg Hx      Prior to Admission medications  Medication Sig Start Date End Date Taking? Authorizing Provider  Accu-Chek Softclix Lancets lancets Use 1 lancet up to 4 times daily as directed 03/12/22   Samtani, Jai-Gurmukh, MD  acetaminophen  (TYLENOL ) 500 MG tablet Take 1,000 mg by mouth 2 (two) times daily as needed for headache.    [provider]   acidophilus (RISAQUAD) CAPS capsule Take 2 capsules by mouth daily. Patient not taking: Reported on 11/22/2024 11/22/24   Vann, Jessica U, DO  amoxicillin -clavulanate (AUGMENTIN ) 500-125 MG tablet Take 1 tablet by mouth daily at 8 pm. 11/21/24   Juvenal Harlene PENNER, DO  B Complex-C-Folic Acid  (RENA-VITE PO) Take 1 tablet by mouth every morning.    [provider]  buPROPion  (WELLBUTRIN  XL) 300 MG 24 hr tablet Take 1 tablet (300 mg total) by mouth in the morning. 10/19/24   Plotnikov, Aleksei V, MD  carboxymethylcellulose (REFRESH PLUS) 0.5 % SOLN Place 1 drop into both eyes 3 (three) times daily as needed (dry eyes/irritation).    [provider]  cinacalcet  (SENSIPAR ) 30 MG tablet Take 30 mg by mouth Every Tuesday,Thursday,and Saturday with dialysis.    [provider]  cyclobenzaprine  (FLEXERIL ) 10 MG tablet Take 1 tablet (10 mg total) by mouth 3 (three) times daily as needed for muscle spasms. 09/12/24   Plotnikov, Aleksei V, MD  diphenhydramine -acetaminophen  (TYLENOL  PM) 25-500 MG TABS tablet Take 2 tablets by mouth 3 (three) times a week. At bedtime Patient taking differently: Take 2 tablets by mouth at bedtime as needed (pain and sleep).    [provider]  docusate sodium  (COLACE) 50 MG capsule Take 50 mg by mouth 2 (two) times daily.    [provider]  FLUoxetine  (PROZAC ) 40 MG capsule Take 1 capsule (40 mg total) by mouth in the morning. 08/26/23     fluticasone  (FLONASE ) 50 MCG/ACT nasal spray Place 2 sprays into both nostrils daily. Patient not taking: Reported on 11/22/2024 11/30/23   Soldatova, Liuba, MD  glimepiride  (AMARYL ) 1 MG tablet Take 1 tablet (1 mg total) by mouth daily with breakfast. 10/19/24   Plotnikov, Anita GAILS, MD  ketoconazole  (NIZORAL ) 2 % cream Apply 1 Application topically daily. 04/04/23   Plotnikov, Aleksei V, MD  lanthanum (FOSRENOL) 1000 MG chewable tablet Chew 1,000-2,000 mg by mouth See admin instructions. Taking 2000 mg  with meals and 1000 mg  with snacks    [provider]  LORazepam  (ATIVAN ) 2 MG tablet Take 1 tablet (2 mg total) by mouth 2 (two) times daily as needed for anxiety. 03/14/24     midodrine  (PROAMATINE ) 10 MG tablet Take 1 tablet (10 mg total) by mouth in the morning, at noon, and at bedtime. 11/21/24  Vann, Jessica U, DO  Multiple Vitamins-Minerals (PRESERVISION AREDS 2) CAPS Take 1 capsule by mouth in the morning and at bedtime.    [provider]  naloxone  (NARCAN ) nasal spray 4 mg/0.1 mL Take by nasal route every 3 minutes until patient awakes or EMS arrives. 01/20/22     oxyCODONE  (ROXICODONE ) 15 MG immediate release tablet Take 1 tablet (15 mg total) by mouth 4 (four) times daily as needed. 02/22/24     pantoprazole  (PROTONIX ) 40 MG tablet TAKE 1 TABLET BY MOUTH TWICE  DAILY 09/28/24   Abran Norleen SAILOR, MD  pilocarpine  (SALAGEN ) 5 MG tablet TAKE 1 TABLET BY MOUTH TWICE  DAILY 05/24/24   Plotnikov, Aleksei V, MD  pioglitazone  (ACTOS ) 15 MG tablet Take 1 tablet (15 mg total) by mouth daily. 10/19/24   Plotnikov, Aleksei V, MD  polyethylene glycol (MIRALAX  / GLYCOLAX ) 17 g packet Take 17 g by mouth daily as needed for moderate constipation.    [provider]  PREVIDENT 5000 DRY MOUTH 1.1 % GEL dental gel Place 1 application  onto teeth in the morning and at bedtime. 10/25/21   [provider]  REPATHA  SURECLICK 140 MG/ML SOAJ INJECT 1 PEN SUBCUTANEOUSLY  EVERY 2 WEEKS 10/24/24   Croitoru, Mihai, MD  [Paused] Semaglutide ,0.25 or 0.5MG /DOS, (OZEMPIC , 0.25 OR 0.5 MG/DOSE,) 2 MG/3ML SOPN INJECT SUBCUTANEOUSLY 0.5 MG  EVERY WEEK Patient not taking: Reported on 11/22/2024 Wait to take this until your doctor or other care provider tells you to start again. 11/04/24   Plotnikov, Anita GAILS, MD    Physical Exam: Vitals:   11/29/24 1415 11/29/24 1421 11/29/24 1833  BP: (!) 143/51  (!) 108/50  Pulse: 96  73  Resp: (!) 24  12  Temp: (!) 97.5 F (36.4 C)  97.9 F (36.6 C)   TempSrc:   Oral  SpO2: 99%  95%  Weight:  82.4 kg   Height:  5' 0.5 (1.537 m)    Constitutional: Resting in bed, NAD, calm, comfortable Eyes: EOMI, lids and conjunctivae normal ENMT: Mucous membranes are moist. Posterior pharynx clear of any exudate or lesions.Normal dentition.  Neck: normal, supple, no masses. Respiratory: clear to auscultation bilaterally, no wheezing, no crackles. Normal respiratory effort. No accessory muscle use.  Cardiovascular: Regular rate and rhythm, no murmurs / rubs / gallops. No extremity edema. 2+ pedal pulses.  RUE AVF with palpable thrill. Abdomen: Mild epigastric tenderness, no masses palpated. Musculoskeletal: no clubbing / cyanosis. No joint deformity upper and lower extremities. Good ROM, no contractures. Normal muscle tone.  Skin: no rashes, lesions, ulcers. No induration Neurologic: Sensation intact. Strength 5/5 in all 4.  Psychiatric: Normal judgment and insight. Alert and oriented x 3. Normal mood.   EKG: Not performed.  Assessment/Plan Principal Problem:   Colitis with rectal bleeding Active Problems:   Chronic hypotension   ESRD on hemodialysis ([name])   Anemia of chronic disease   Anxiety and depression   Type 2 diabetes mellitus with chronic kidney disease, without long-term current use of insulin  ([name])   KAILEE ESSMAN is a 73 y.o. female with medical history significant for ESRD on TTS HD, T2DM, chronic hypotension, anemia of chronic disease, depression/anxiety, chronic pain, and history of recurrent ischemic colitis who is admitted with recurrent rectal bleeding in setting of persistent colitis.  Assessment and Plan: Recurrent rectal bleeding in setting of persistent colitis: Patient with 2 recent admissions for the same.  She developed recurrent rectal bleeding the day prior to this admission with  another episode after dialysis today.  Repeat CTA A/P showed persistent moderate narrowing involving the cecum.  Hemoglobin currently stable  at 8.8. - [facility] to consult in a.m. - Will keep NPO after midnight - Patient afebrile and hemodynamically stable, will hold off on systemic antibiotics for now  Chronic hypotension: Continue midodrine  10 mg 3 times daily.  ESRD on TTS HD: She completed her usual dialysis today prior to admission.  Will need nephrology consult in a.m. for routine dialysis needs, next due on Saturday.  Anemia of chronic disease: Hemoglobin is stable at 8.8.  Continue to monitor closely.  Type 2 diabetes: Holding home meds.  Placed on SSI.  Depression/anxiety: Continue bupropion , fluoxetine , Ativan  as needed.  Chronic pain: Continue home Oxy IR 15 mg 4 times daily as needed.   DVT prophylaxis: SCDs Start: 11/29/24 1923 Code Status:   Code Status: Do not attempt resuscitation (DNR) PRE-ARREST INTERVENTIONS DESIRED confirmed with patient on admission. Family Communication: Discussed with patient's sister by phone Disposition Plan: From home, dispo pending clinical progress Consults called: GI Severity of Illness: The appropriate patient status for this patient is OBSERVATION. Observation status is judged to be reasonable and necessary in order to provide the required intensity of service to ensure the patient's safety. The patient's presenting symptoms, physical exam findings, and initial radiographic and laboratory data in the context of their medical condition is felt to place them at decreased risk for further clinical deterioration. Furthermore, it is anticipated that the patient will be medically stable for discharge from the hospital within 2 midnights of admission.   Anita Blanch MD [facility]  If 7PM-7AM, please contact night-coverage [email or web address]  11/29/2024, 7:39 PM      [1]  Social History Tobacco Use   Smoking status: Former    Current packs/day: 0.00    Average packs/day: 1 pack/day for 20.0 years (20.0 ttl pk-yrs)    Types: Cigarettes    Start date: 12/13/1968     Quit date: 12/13/1988    Years since quitting: 35.9   Smokeless tobacco: Never  Vaping Use   Vaping status: Never Used  Substance Use Topics   Alcohol  use: Not Currently    Alcohol /week: 0.0 standard drinks of alcohol     Comment: rarely   Drug use: No  [2]  Allergies Allergen Reactions   Erythromycin  Diarrhea and Nausea And Vomiting   Gabapentin Other (See Comments)    Confusion and falling  Hallucinations

## 2024-11-29 NOTE — ED Provider Notes (Signed)
 [place] EMERGENCY DEPARTMENT AT [facility] Provider Note   CSN: [id number] Arrival date & time: 11/29/24  1411     Patient presents with: Rectal Bleeding   [name] is a 73 y.o. female.   73 year old female history of ESRD on HD with recent admission to the hospital from right sided colitis versus mass who presents to the emergency department with rectal bleeding.  Since being discharged had intermittent bright red blood per rectum.  Had 4 episodes yesterday.  Has been getting her dialysis as scheduled.  Not on blood thinners.  Says that she will intermittently have upper abdominal pain.  No fevers or chills.  No nausea or vomiting.  Completed her antibiotics as prescribed       Prior to Admission medications  Medication Sig Start Date End Date Taking? Authorizing Provider  Accu-Chek Softclix Lancets lancets Use 1 lancet up to 4 times daily as directed 03/12/22   Samtani, Jai-Gurmukh, MD  acetaminophen  (TYLENOL ) 500 MG tablet Take 1,000 mg by mouth 2 (two) times daily as needed for headache.    [provider]  acidophilus (RISAQUAD) CAPS capsule Take 2 capsules by mouth daily. Patient not taking: Reported on 11/22/2024 11/22/24   Vann, Jessica U, DO  amoxicillin -clavulanate (AUGMENTIN ) 500-125 MG tablet Take 1 tablet by mouth daily at 8 pm. 11/21/24   Juvenal Harlene PENNER, DO  B Complex-C-Folic Acid  (RENA-VITE PO) Take 1 tablet by mouth every morning.    [provider]  buPROPion  (WELLBUTRIN  XL) 300 MG 24 hr tablet Take 1 tablet (300 mg total) by mouth in the morning. 10/19/24   Plotnikov, Aleksei V, MD  carboxymethylcellulose (REFRESH PLUS) 0.5 % SOLN Place 1 drop into both eyes 3 (three) times daily as needed (dry eyes/irritation).    [provider]  cinacalcet  (SENSIPAR ) 30 MG tablet Take 30 mg by mouth Every Tuesday,Thursday,and Saturday with dialysis.    [provider]  cyclobenzaprine  (FLEXERIL ) 10 MG tablet Take 1 tablet (10  mg total) by mouth 3 (three) times daily as needed for muscle spasms. 09/12/24   Plotnikov, Aleksei V, MD  diphenhydramine -acetaminophen  (TYLENOL  PM) 25-500 MG TABS tablet Take 2 tablets by mouth 3 (three) times a week. At bedtime Patient taking differently: Take 2 tablets by mouth at bedtime as needed (pain and sleep).    [provider]  docusate sodium  (COLACE) 50 MG capsule Take 50 mg by mouth 2 (two) times daily.    [provider]  FLUoxetine  (PROZAC ) 40 MG capsule Take 1 capsule (40 mg total) by mouth in the morning. 08/26/23     fluticasone  (FLONASE ) 50 MCG/ACT nasal spray Place 2 sprays into both nostrils daily. Patient not taking: Reported on 11/22/2024 11/30/23   Soldatova, Liuba, MD  glimepiride  (AMARYL ) 1 MG tablet Take 1 tablet (1 mg total) by mouth daily with breakfast. 10/19/24   Plotnikov, Karlynn GAILS, MD  ketoconazole  (NIZORAL ) 2 % cream Apply 1 Application topically daily. 04/04/23   Plotnikov, Aleksei V, MD  lanthanum (FOSRENOL) 1000 MG chewable tablet Chew 1,000-2,000 mg by mouth See admin instructions. Taking 2000 mg with meals and 1000 mg  with snacks    [provider]  LORazepam  (ATIVAN ) 2 MG tablet Take 1 tablet (2 mg total) by mouth 2 (two) times daily as needed for anxiety. 03/14/24     midodrine  (PROAMATINE ) 10 MG tablet Take 1 tablet (10 mg total) by mouth in the morning, at noon, and at bedtime. 11/21/24   Vann, Jessica  U, DO  Multiple Vitamins-Minerals (PRESERVISION AREDS 2) CAPS Take 1 capsule by mouth in the morning and at bedtime.    [provider]  naloxone  (NARCAN ) nasal spray 4 mg/0.1 mL Take by nasal route every 3 minutes until patient awakes or EMS arrives. 01/20/22     oxyCODONE  (ROXICODONE ) 15 MG immediate release tablet Take 1 tablet (15 mg total) by mouth 4 (four) times daily as needed. 02/22/24     pantoprazole  (PROTONIX ) 40 MG tablet TAKE 1 TABLET BY MOUTH TWICE  DAILY 09/28/24   Abran Norleen SAILOR, MD  pilocarpine  (SALAGEN ) 5 MG  tablet TAKE 1 TABLET BY MOUTH TWICE  DAILY 05/24/24   Plotnikov, Aleksei V, MD  pioglitazone  (ACTOS ) 15 MG tablet Take 1 tablet (15 mg total) by mouth daily. 10/19/24   Plotnikov, Aleksei V, MD  polyethylene glycol (MIRALAX  / GLYCOLAX ) 17 g packet Take 17 g by mouth daily as needed for moderate constipation.    [provider]  PREVIDENT 5000 DRY MOUTH 1.1 % GEL dental gel Place 1 application  onto teeth in the morning and at bedtime. 10/25/21   [provider]  REPATHA  SURECLICK 140 MG/ML SOAJ INJECT 1 PEN SUBCUTANEOUSLY  EVERY 2 WEEKS 10/24/24   Croitoru, Mihai, MD  [Paused] Semaglutide ,0.25 or 0.5MG /DOS, (OZEMPIC , 0.25 OR 0.5 MG/DOSE,) 2 MG/3ML SOPN INJECT SUBCUTANEOUSLY 0.5 MG  EVERY WEEK Patient not taking: Reported on 11/22/2024 Wait to take this until your doctor or other care provider tells you to start again. 11/04/24   Plotnikov, Aleksei V, MD    Allergies: Erythromycin  and Gabapentin    Review of Systems  Updated Vital Signs BP (!) 143/51 (BP Location: Left Arm)   Pulse 96   Temp (!) 97.5 F (36.4 C)   Resp (!) 24   Ht 5' 0.5 (1.537 m)   Wt 82.4 kg   SpO2 99%   BMI 34.89 kg/m   Physical Exam Vitals and nursing note reviewed.  Constitutional:      General: She is not in acute distress.    Appearance: She is well-developed.  HENT:     Head: Normocephalic and atraumatic.     Right Ear: External ear normal.     Left Ear: External ear normal.     Nose: Nose normal.  Eyes:     Extraocular Movements: Extraocular movements intact.     Conjunctiva/sclera: Conjunctivae normal.     Pupils: Pupils are equal, round, and reactive to light.  Abdominal:     General: Abdomen is flat. There is no distension.     Palpations: Abdomen is soft. There is no mass.     Tenderness: There is abdominal tenderness (Minimal, diffuse). There is no guarding.  Genitourinary:    Comments: Chaperoned by patient's RN Alexandra.  External hemorrhoid noted.  Bright red blood on  rectal exam Musculoskeletal:     Cervical back: Normal range of motion and neck supple.     Right lower leg: No edema.     Left lower leg: No edema.  Skin:    General: Skin is warm and dry.  Neurological:     Mental Status: She is alert and oriented to person, place, and time. Mental status is at baseline.  Psychiatric:        Mood and Affect: Mood normal.     (all labs ordered are listed, but only abnormal results are displayed) Labs Reviewed  COMPREHENSIVE METABOLIC PANEL WITH GFR - Abnormal; Notable for the following components:  Result Value   Chloride 91 (*)    Glucose, Bld 188 (*)    Creatinine, Ser 2.20 (*)    Calcium 8.7 (*)    Alkaline Phosphatase 128 (*)    GFR, Estimated 23 (*)    Anion gap 18 (*)    All other components within normal limits  CBC - Abnormal; Notable for the following components:   WBC 13.5 (*)    RBC 2.80 (*)    Hemoglobin 8.8 (*)    HCT 28.9 (*)    MCV 103.2 (*)    All other components within normal limits  POC OCCULT BLOOD, ED - Abnormal; Notable for the following components:   Fecal Occult Bld POSITIVE (*)    All other components within normal limits  LIPASE, BLOOD  TYPE AND SCREEN    EKG: None  Radiology: CT Angio Abd/Pel W and/or Wo Contrast Result Date: 11/29/2024 CLINICAL DATA:  Chronic mesenteric ischemia.  Rectal bleeding. EXAM: CTA ABDOMEN AND PELVIS WITHOUT AND WITH CONTRAST TECHNIQUE: Multidetector CT imaging of the abdomen and pelvis was performed using the standard protocol during bolus administration of intravenous contrast. Multiplanar reconstructed images and MIPs were obtained and reviewed to evaluate the vascular anatomy. RADIATION DOSE REDUCTION: This exam was performed according to the departmental dose-optimization program which includes automated exposure control, adjustment of the mA and/or kV according to patient size and/or use of iterative reconstruction technique. CONTRAST:  85mL OMNIPAQUE  IOHEXOL  350 MG/ML SOLN  COMPARISON:  Twelve days ago FINDINGS: VASCULAR Aorta: Normal caliber aorta without aneurysm, dissection, vasculitis or significant stenosis. Aortic atherosclerosis. Celiac: Patent without evidence of aneurysm, dissection, vasculitis or significant stenosis. SMA: Patent without evidence of aneurysm, dissection, vasculitis or significant stenosis. Renals: Both renal arteries are patent without evidence of aneurysm or dissection, although they are diminutive in caliber consistent with history of end-stage renal disease. IMA: Patent without evidence of aneurysm, dissection, vasculitis or significant stenosis. Inflow: Patent without evidence of aneurysm, dissection, vasculitis or significant stenosis. Proximal Outflow: Bilateral common femoral and visualized portions of the superficial and profunda femoral arteries are patent without evidence of aneurysm, dissection, vasculitis or significant stenosis. Veins: No obvious venous abnormality within the limitations of this arterial phase study. Review of the MIP images confirms the above findings. NON-VASCULAR Lower chest: No acute abnormality. Hepatobiliary: No focal liver abnormality is seen. No gallstones, gallbladder wall thickening, or biliary dilatation. Pancreas: Unremarkable. No pancreatic ductal dilatation or surrounding inflammatory changes. Spleen: Normal in size without focal abnormality. Adrenals/Urinary Tract: Adrenal glands appear normal. Bilateral renal atrophy is noted with multiple cyst formation consistent history of end-stage renal disease. Urinary bladder is decompressed. Stomach/Bowel: Stomach is unremarkable. There is no evidence of bowel obstruction. Large amount of residual contrast is noted throughout the colon, preventing evaluation for active gastrointestinal bleeding. Persistent moderate narrowing is seen involving the cecum which may represent focal inflammation or possibly colonic malignancy. Colonoscopy is recommended. Lymphatic: Mildly  enlarged mesenteric lymph nodes are noted in the right lower quadrant which most likely are inflammatory or reactive in etiology, but metastatic disease cannot be excluded. Reproductive: Uterus and bilateral adnexa are unremarkable. Other: Moderate size fat containing periumbilical hernia. No ascites. Musculoskeletal: No acute or significant osseous findings. IMPRESSION: 1. Persistent moderate narrowing is seen involving the cecum which may represent focal inflammation or possibly colonic malignancy. Colonoscopy is recommended. Mildly enlarged mesenteric lymph nodes are noted in the right lower quadrant which most likely are inflammatory or reactive in etiology, but metastatic disease cannot be excluded.  2. Large amount of residual contrast is noted throughout the colon, preventing evaluation for active gastrointestinal bleeding. 3.    Moderate size fat containing periumbilical hernia. Aortic Atherosclerosis (ICD10-I70.0). Electronically Signed   By: Lynwood Landy Raddle M.D.   On: 11/29/2024 16:10     Procedures   Medications Ordered in the ED  iohexol  (OMNIPAQUE ) 350 MG/ML injection 85 mL (85 mLs Intravenous Contrast Given 11/29/24 1530)    Clinical Course as of 11/29/24 1832  Thu Nov 29, 2024  1724 CT Angio Abd/Pel W and/or Wo Contrast Persistent moderate narrowing is seen involving the cecum which may represent focal inflammation or possibly colonic malignancy. Colonoscopy is recommended.   [RP]  1750 Dr Leigh from GI consulted.  [RP]  1828 Dr Tobie from hospitalist consulted for admission [RP]    Clinical Course User Index [RP] Yolande Lamar BROCKS, MD                                 Medical Decision Making Amount and/or Complexity of Data Reviewed Labs: ordered. Radiology:  Decision-making details documented in ED Course.  Risk Decision regarding hospitalization.   EULA JASTER is a 73 year old female history of ESRD with recent admission to the hospital for colitis versus mass  who presents emergency department persistent rectal bleeding  Initial Ddx:  Malignancy, ischemic colitis, GI bleed, infectious colitis  MDM/Course:  Patient presents emergency department with persistent rectal bleeding and abdominal pain since her admission for colitis.  She is on any blood thinners.  Not in acute distress.  Vital signs are overall reassuring.  Does have some mild generalized abdominal pain on her exam.  She has some external hemorrhoids that are nonbleeding with bright red blood on her rectal exam.  Did have a CTA today that shows focal thickening of the cecum that is concerning for focal inflammation versus malignancy.  Did inform the patient of these findings.  Discussed with GI who feels that the patient may benefit from colonoscopy at this point in time so was admitted to hospitalist for further evaluation.  Upon re-evaluation no new symptoms.  This patient presents to the ED for concern of complaints listed in HPI, this involves an extensive number of treatment options, and is a complaint that carries with it a high risk of complications and morbidity. Disposition including potential need for admission considered.   Dispo: Admit to Floor  I have reviewed the patients home medications and made adjustments as needed Records reviewed DC Summary The following labs were independently interpreted: CBC and show chronic anemia I independently reviewed the following imaging with scope of interpretation limited to determining acute life threatening conditions related to emergency care: CT Abdomen/Pelvis and agree with the radiologist interpretation with the following exceptions: none I personally reviewed and interpreted cardiac monitoring: normal sinus rhythm  I personally reviewed and interpreted the pt's EKG: see above for interpretation  Consults: Gastroenterology and Hospitalist Social Determinants of health:  Geriatric  Portions of this note were generated with Administrator, sports. Dictation errors may occur despite best attempts at proofreading.     Final diagnoses:  Rectal bleeding  Abdominal pain, unspecified abdominal location    ED Discharge Orders     None          Yolande Lamar BROCKS, MD 11/29/24 [phone]

## 2024-11-29 NOTE — Telephone Encounter (Signed)
 LVMTCB to check on patient due to missing appointment today.

## 2024-11-29 NOTE — ED Notes (Signed)
 Attending provider informed of bp map in the 50's (asymptomatic) and 2200 Hgb and Hct results. Per admitting provider continue to monitor

## 2024-11-29 NOTE — ED Triage Notes (Addendum)
 Pt here with c/o rectal bleeding, noticed bright red blood and clots in her stool. Reports this has been an ongoing issue, was just discharged this past week for the same issue.

## 2024-11-30 DIAGNOSIS — K559 Vascular disorder of intestine, unspecified: Secondary | ICD-10-CM | POA: Diagnosis not present

## 2024-11-30 DIAGNOSIS — F418 Other specified anxiety disorders: Secondary | ICD-10-CM | POA: Diagnosis not present

## 2024-11-30 DIAGNOSIS — D62 Acute posthemorrhagic anemia: Secondary | ICD-10-CM | POA: Diagnosis not present

## 2024-11-30 DIAGNOSIS — Z8249 Family history of ischemic heart disease and other diseases of the circulatory system: Secondary | ICD-10-CM | POA: Diagnosis not present

## 2024-11-30 DIAGNOSIS — M797 Fibromyalgia: Secondary | ICD-10-CM | POA: Diagnosis present

## 2024-11-30 DIAGNOSIS — K625 Hemorrhage of anus and rectum: Secondary | ICD-10-CM | POA: Diagnosis present

## 2024-11-30 DIAGNOSIS — Z833 Family history of diabetes mellitus: Secondary | ICD-10-CM | POA: Diagnosis not present

## 2024-11-30 DIAGNOSIS — Z992 Dependence on renal dialysis: Secondary | ICD-10-CM | POA: Diagnosis not present

## 2024-11-30 DIAGNOSIS — N186 End stage renal disease: Secondary | ICD-10-CM | POA: Diagnosis not present

## 2024-11-30 DIAGNOSIS — F32A Depression, unspecified: Secondary | ICD-10-CM | POA: Diagnosis present

## 2024-11-30 DIAGNOSIS — E1122 Type 2 diabetes mellitus with diabetic chronic kidney disease: Secondary | ICD-10-CM | POA: Diagnosis present

## 2024-11-30 DIAGNOSIS — K633 Ulcer of intestine: Secondary | ICD-10-CM | POA: Diagnosis not present

## 2024-11-30 DIAGNOSIS — E1142 Type 2 diabetes mellitus with diabetic polyneuropathy: Secondary | ICD-10-CM | POA: Diagnosis present

## 2024-11-30 DIAGNOSIS — R933 Abnormal findings on diagnostic imaging of other parts of digestive tract: Secondary | ICD-10-CM | POA: Diagnosis not present

## 2024-11-30 DIAGNOSIS — Z7985 Long-term (current) use of injectable non-insulin antidiabetic drugs: Secondary | ICD-10-CM | POA: Diagnosis not present

## 2024-11-30 DIAGNOSIS — Z87891 Personal history of nicotine dependence: Secondary | ICD-10-CM | POA: Diagnosis not present

## 2024-11-30 DIAGNOSIS — K551 Chronic vascular disorders of intestine: Secondary | ICD-10-CM | POA: Diagnosis present

## 2024-11-30 DIAGNOSIS — Z96651 Presence of right artificial knee joint: Secondary | ICD-10-CM | POA: Diagnosis present

## 2024-11-30 DIAGNOSIS — N2581 Secondary hyperparathyroidism of renal origin: Secondary | ICD-10-CM | POA: Diagnosis present

## 2024-11-30 DIAGNOSIS — K51811 Other ulcerative colitis with rectal bleeding: Secondary | ICD-10-CM | POA: Diagnosis present

## 2024-11-30 DIAGNOSIS — I120 Hypertensive chronic kidney disease with stage 5 chronic kidney disease or end stage renal disease: Secondary | ICD-10-CM | POA: Diagnosis present

## 2024-11-30 DIAGNOSIS — Z96611 Presence of right artificial shoulder joint: Secondary | ICD-10-CM | POA: Diagnosis present

## 2024-11-30 DIAGNOSIS — Z79899 Other long term (current) drug therapy: Secondary | ICD-10-CM | POA: Diagnosis not present

## 2024-11-30 DIAGNOSIS — R1084 Generalized abdominal pain: Secondary | ICD-10-CM

## 2024-11-30 DIAGNOSIS — K529 Noninfective gastroenteritis and colitis, unspecified: Secondary | ICD-10-CM | POA: Diagnosis not present

## 2024-11-30 DIAGNOSIS — R109 Unspecified abdominal pain: Secondary | ICD-10-CM

## 2024-11-30 DIAGNOSIS — D631 Anemia in chronic kidney disease: Secondary | ICD-10-CM | POA: Diagnosis present

## 2024-11-30 DIAGNOSIS — G894 Chronic pain syndrome: Secondary | ICD-10-CM | POA: Diagnosis present

## 2024-11-30 DIAGNOSIS — K5731 Diverticulosis of large intestine without perforation or abscess with bleeding: Secondary | ICD-10-CM | POA: Diagnosis present

## 2024-11-30 DIAGNOSIS — F419 Anxiety disorder, unspecified: Secondary | ICD-10-CM | POA: Diagnosis present

## 2024-11-30 DIAGNOSIS — Z66 Do not resuscitate: Secondary | ICD-10-CM | POA: Diagnosis present

## 2024-11-30 DIAGNOSIS — Z7984 Long term (current) use of oral hypoglycemic drugs: Secondary | ICD-10-CM | POA: Diagnosis not present

## 2024-11-30 DIAGNOSIS — E119 Type 2 diabetes mellitus without complications: Secondary | ICD-10-CM | POA: Diagnosis not present

## 2024-11-30 DIAGNOSIS — I10 Essential (primary) hypertension: Secondary | ICD-10-CM | POA: Diagnosis not present

## 2024-11-30 LAB — CBC
HCT: 24.4 % — ABNORMAL LOW (ref 36.0–46.0)
Hemoglobin: 7.4 g/dL — ABNORMAL LOW (ref 12.0–15.0)
MCH: 31.2 pg (ref 26.0–34.0)
MCHC: 30.3 g/dL (ref 30.0–36.0)
MCV: 103 fL — ABNORMAL HIGH (ref 80.0–100.0)
Platelets: 275 K/uL (ref 150–400)
RBC: 2.37 MIL/uL — ABNORMAL LOW (ref 3.87–5.11)
RDW: 15.7 % — ABNORMAL HIGH (ref 11.5–15.5)
WBC: 8.2 K/uL (ref 4.0–10.5)
nRBC: 0 % (ref 0.0–0.2)

## 2024-11-30 LAB — CBG MONITORING, ED
Glucose-Capillary: 87 mg/dL (ref 70–99)
Glucose-Capillary: 98 mg/dL (ref 70–99)
Glucose-Capillary: 133 mg/dL — ABNORMAL HIGH (ref 70–99)
Glucose-Capillary: 129 mg/dL — ABNORMAL HIGH (ref 70–99)

## 2024-11-30 LAB — BASIC METABOLIC PANEL WITH GFR
Anion gap: 12 (ref 5–15)
BUN: 17 mg/dL (ref 8–23)
CO2: 32 mmol/L (ref 22–32)
Calcium: 8.3 mg/dL — ABNORMAL LOW (ref 8.9–10.3)
Chloride: 92 mmol/L — ABNORMAL LOW (ref 98–111)
Creatinine, Ser: 3.11 mg/dL — ABNORMAL HIGH (ref 0.44–1.00)
GFR, Estimated: 15 mL/min — ABNORMAL LOW
Glucose, Bld: 93 mg/dL (ref 70–99)
Potassium: 3.5 mmol/L (ref 3.5–5.1)
Sodium: 136 mmol/L (ref 135–145)

## 2024-11-30 LAB — HEMOGLOBIN AND HEMATOCRIT, BLOOD
HCT: 24.1 % — ABNORMAL LOW (ref 36.0–46.0)
HCT: 27.7 % — ABNORMAL LOW (ref 36.0–46.0)
HCT: 29.3 % — ABNORMAL LOW (ref 36.0–46.0)
Hemoglobin: 7.2 g/dL — ABNORMAL LOW (ref 12.0–15.0)
Hemoglobin: 8.9 g/dL — ABNORMAL LOW (ref 12.0–15.0)
Hemoglobin: 9.4 g/dL — ABNORMAL LOW (ref 12.0–15.0)

## 2024-11-30 LAB — GLUCOSE, CAPILLARY
Glucose-Capillary: 129 mg/dL — ABNORMAL HIGH (ref 70–99)
Glucose-Capillary: 160 mg/dL — ABNORMAL HIGH (ref 70–99)

## 2024-11-30 LAB — PREPARE RBC (CROSSMATCH)

## 2024-11-30 MED ORDER — SODIUM CHLORIDE 0.9 % IV SOLN
INTRAVENOUS | Status: AC
  Administered 2024-11-30: 1000 mL via INTRAVENOUS

## 2024-11-30 MED ORDER — POLYETHYLENE GLYCOL 3350 17 GM/SCOOP PO POWD
119.0000 g | Freq: Once | ORAL | Status: AC
  Administered 2024-11-30: 119 g via ORAL
  Filled 2024-11-30: qty 119

## 2024-11-30 MED ORDER — CHLORHEXIDINE GLUCONATE CLOTH 2 % EX PADS
6.0000 | MEDICATED_PAD | Freq: Every day | CUTANEOUS | Status: DC
  Administered 2024-12-01: 6 via TOPICAL

## 2024-11-30 MED ORDER — SODIUM CHLORIDE 0.9% IV SOLUTION: Freq: Once | INTRAVENOUS | Status: AC

## 2024-11-30 MED ORDER — POLYETHYLENE GLYCOL 3350 17 GM/SCOOP PO POWD
119.0000 g | Freq: Once | ORAL | Status: AC
  Filled 2024-11-30: qty 119

## 2024-11-30 NOTE — Progress Notes (Signed)
" °   11/30/24 1840  Vitals  Temp 98.7 F (37.1 C)  Temp Source Oral  BP (!) 136/57  MAP (mmHg) 79  BP Location Left Arm  BP Method Automatic  Patient Position (if appropriate) Lying  Pulse Rate 83  Pulse Rate Source Monitor  ECG Heart Rate 87  Resp 18  Level of Consciousness  Level of Consciousness Alert  MEWS COLOR  MEWS Score Color Green  Oxygen Therapy  SpO2 95 %  O2 Device Room Air  PCA/Epidural/Spinal Assessment  Respiratory Pattern Regular;Unlabored  MEWS Score  MEWS Temp 0  MEWS Systolic 0  MEWS Pulse 0  MEWS RR 0  MEWS LOC 0  MEWS Score 0   Pt admitted to the unit from ED. Pt is Ax0x4 . Pt VSS and assessment completed. Pt is oriented to the unit. Pt belongings are at the bedside.Pt pain level on arrival is 7/10 in feet but stated wanting to waiting until after bowl prep to take medication. 2 person skin assessment completed with Mliss DASEN RN. Tele applied and CCMD called. Bed in lowest position and call-bell within reach. "

## 2024-11-30 NOTE — H&P (View-Only) (Signed)
 "   Consultation  Referring Provider: TRH/Patel Primary Care Physician:  Garald Karlynn GAILS, MD Primary Gastroenterologist:  Dr.Perry  Reason for Consultation: Recurrent rectal bleeding, ischemic colitis  HPI: [name] is a 73 y.o. female with history of end-stage renal disease on dialysis, now with chronic issues with hypotension requiring midodrine  3 times daily, diabetes mellitus, history of benign brain tumor, status postresection ,chronic pain syndrome, fibromyalgia.  Patient has had 2 recent admissions with complications from what is felt to be right sided ischemic colitis and recurrent GI bleeding secondary to same.  She was just discharged on 11/21/2024.  Was made at that time to keep her on midodrine  10 mg 3 times daily, as she was having repeated episodes of hypotension primarily with dialysis but sometimes becoming hypotensive in between dialysis sessions and it was felt this may have been contributing to low flow state.  Did not undergo colonoscopies during those admissions because of right sided ischemic colitis.  CT angio on 11/17/2024 show any evidence of active extravasation, there was persistent circumferential colonic wall thickening involving the ascending colon with associated inflammation and lymphadenopathy inflammatory infectious versus neoplastic also noting diverticulosis without diverticulitis.  She improved with conservative management,. Patient says that she did okay for couple of days after discharge did not have any significant abdominal pain and did not have any evidence of active bleeding.  Says she tends to stay constipated and has pasty stools with her phosphorus binder. 2 days ago on Wednesday she says she had 4 bowel movements that appeared coffee ground blackish in appearance.  She went to dialysis yesterday but says she developed severe crampy abdominal pain while in the waiting room at dialysis and had urge for bowel movement but she says that she tries  not to let herself have bowel movements during dialysis if possible. After dialysis she says she went home and depends was full of very dark stool with clots.  She had a bowel movement appearing very similar.  She says she was feeling kind of dizzy and weak and laid down and may have fallen asleep.  She was trying to be seen by primary care yesterday but missed that appointment.  They directed her on to the ER because of the bleeding.  Yesterday afternoon she had 2 more bowel movements that contained more bright red blood and then has been 3 times since she has been in the ER with clots and bright red blood.  Repeat CTA last night showed patent mesenteric vessels, large amount of residual contrast throughout her colon preventing evaluation for active GI bleeding, there is persistent moderate narrowing seen involving the cecum which may represent focal inflammation or possibly colonic malignancy, there are also mildly enlarged mesenteric lymph nodes which may be reactive but could not rule out metastatic disease.  She has a fat-containing periumbilical hernia.  He is feeling better this morning is not having any significant cramping or abdominal pain, has been having clear liquids.  She is frustrated by her stay in the emergency room in which she could have had a bowel prep last night.  Labs this a.m. WBC 8.2/hemoglobin 7.4/hematocrit 24.4/MCV 103 down from hemoglobin of 8.8 on arrival Potassium 3.5/BUN 17/creatinine 3.11  Blood pressure 117/55    blood pressure early a.m. 86/52  Last colonoscopy had been done in 2023/Dr. Abran inadequate prep, diffuse severe mucosal changes at the hepatic flexure in the ascending colon and in the cecum secondary most likely to ischemic colitis biopsies were done, noted  diverticulosis in the rectosigmoid sigmoid and descending colon bleeding internal hemorrhoids. Biopsies at that time showed severe acute colitis with ulcerations and a few crystalline deposits  morphologically consistent with sevelomer crystals which apparently can induce ischemic colitis   Past Medical History:  Diagnosis Date   Anemia    Anxiety    Brain tumor (benign) ([name])    Colitis 2010   microscopic- Dr Abran   Depression    Diabetes mellitus    type II   Diverticulosis    Dyspnea    with exertion   ESRD (end stage renal disease) ([name])    TTUSAT [address]    Fibromyalgia    GERD (gastroesophageal reflux disease)    Headache    Hemodialysis patient    History of blood transfusion    after knee surgery   Hypertension    discontinued all diuretics and antihypertensives   IBS (irritable bowel syndrome)    LBP (low back pain)    Neuropathy    feet bilat    Osteoarthritis    Osteopenia    Pneumonia    hx of 2014    Rotator cuff tear, right    Sinusitis    currently being treated with antibiotic will complete 03/04/2015    Past Surgical History:  Procedure Laterality Date   A/V FISTULAGRAM N/A 02/03/2024   Procedure: A/V Fistulagram;  Surgeon: Norine Manuelita LABOR, MD;  Location: [facility];  Service: Cardiovascular;  Laterality: N/A;   A/V SHUNT INTERVENTION Right 05/30/2024   Procedure: A/V SHUNT INTERVENTION;  Surgeon: Pearline Norman RAMAN, MD;  Location: [facility];  Service: Cardiovascular;  Laterality: Right;   A/V SHUNT INTERVENTION N/A 09/07/2024   Procedure: A/V SHUNT INTERVENTION;  Surgeon: Lanis Fonda BRAVO, MD;  Location: [facility];  Service: Cardiovascular;  Laterality: N/A;   AV FISTULA PLACEMENT Right 09/12/2020   Procedure: RIGHT ARTERIOVENOUS (AV) FISTULA CREATION;  Surgeon: Harvey Carlin BRAVO, MD;  Location: [facility] OR;  Service: Vascular;  Laterality: Right;   BASCILIC VEIN TRANSPOSITION Right 10/27/2020   Procedure: RIGHT UPPER EXTREMITY SECOND STAGE BASCILIC VEIN TRANSPOSITION;  Surgeon: Harvey Carlin BRAVO, MD;  Location: [facility] OR;  Service: Vascular;  Laterality: Right;   BIOPSY  03/11/2022   Procedure: BIOPSY;  Surgeon: Wilhelmenia Aloha Raddle.,  MD;  Location: [facility] ENDOSCOPY;  Service: Gastroenterology;;   COLONOSCOPY WITH PROPOFOL  N/A 03/11/2022   Procedure: COLONOSCOPY WITH PROPOFOL ;  Surgeon: Wilhelmenia Aloha Raddle., MD;  Location: [facility] ENDOSCOPY;  Service: Gastroenterology;  Laterality: N/A;   foramen magnum ependymoma surgery  2003   Dr Mora   JOINT REPLACEMENT Bilateral    NASAL SINUS SURGERY     1973    PERIPHERAL VASCULAR BALLOON ANGIOPLASTY  02/03/2024   Procedure: PERIPHERAL VASCULAR BALLOON ANGIOPLASTY;  Surgeon: Norine Manuelita LABOR, MD;  Location: [facility];  Service: Cardiovascular;;  Inflow Basilic Vein   REVERSE SHOULDER ARTHROPLASTY Right 06/03/2022   Procedure: REVERSE SHOULDER ARTHROPLASTY;  Surgeon: Melita Drivers, MD;  Location: [facility] ORS;  Service: Orthopedics;  Laterality: Right;    TONSILLECTOMY     TOTAL KNEE ARTHROPLASTY     L 2008, R 2009, R 2016- Dr hiram   TOTAL KNEE REVISION Right 03/05/2015   Procedure: RIGHT TOTAL KNEE ARTHROPLASTY REVISION;  Surgeon: Dempsey Moan, MD;  Location: [facility] ORS;  Service: Orthopedics;  Laterality: Right;   VENOUS ANGIOPLASTY  05/30/2024   Procedure: VENOUS ANGIOPLASTY;  Surgeon: Pearline Norman RAMAN, MD;  Location: [facility];  Service:  Cardiovascular;;  central   VENOUS ANGIOPLASTY  09/07/2024   Procedure: VENOUS ANGIOPLASTY;  Surgeon: Lanis Fonda BRAVO, MD;  Location: [facility];  Service: Cardiovascular;;  innominate 70%    Prior to Admission medications  Medication Sig Start Date End Date Taking? Authorizing Provider  acetaminophen  (TYLENOL ) 500 MG tablet Take 1,000 mg by mouth 2 (two) times daily as needed for headache.   Yes [provider]  acidophilus (RISAQUAD) CAPS capsule Take 2 capsules by mouth daily. Patient taking differently: Take 1 capsule by mouth every evening. 11/22/24  Yes Vann, Jessica U, DO  B Complex-C-Folic Acid  (RENA-VITE PO) Take 1 tablet by mouth every morning.   Yes [provider]  buPROPion  (WELLBUTRIN  XL) 300 MG 24 hr  tablet Take 1 tablet (300 mg total) by mouth in the morning. 10/19/24  Yes Plotnikov, Aleksei V, MD  carboxymethylcellulose (REFRESH PLUS) 0.5 % SOLN Place 1 drop into both eyes 3 (three) times daily as needed (dry eyes/irritation).   Yes [provider]  cinacalcet  (SENSIPAR ) 30 MG tablet Take 30 mg by mouth Every Tuesday,Thursday,and Saturday with dialysis.   Yes [provider]  cyclobenzaprine  (FLEXERIL ) 10 MG tablet Take 1 tablet (10 mg total) by mouth 3 (three) times daily as needed for muscle spasms. 09/12/24  Yes Plotnikov, Karlynn GAILS, MD  diphenhydramine -acetaminophen  (TYLENOL  PM) 25-500 MG TABS tablet Take 2 tablets by mouth 3 (three) times a week. At bedtime Patient taking differently: Take 2 tablets by mouth at bedtime as needed (pain and sleep).   Yes [provider]  docusate sodium  (COLACE) 50 MG capsule Take 50-100 mg by mouth 2 (two) times daily. 1 qam, 2 qpm   Yes [provider]  FLUoxetine  (PROZAC ) 40 MG capsule Take 1 capsule (40 mg total) by mouth in the morning. 08/26/23  Yes   fluticasone  (FLONASE ) 50 MCG/ACT nasal spray Place 2 sprays into both nostrils daily. Patient taking differently: Place 2 sprays into both nostrils as needed for allergies. 11/30/23  Yes Soldatova, Liuba, MD  glimepiride  (AMARYL ) 1 MG tablet Take 1 tablet (1 mg total) by mouth daily with breakfast. 10/19/24  Yes Plotnikov, Karlynn GAILS, MD  ketoconazole  (NIZORAL ) 2 % cream Apply 1 Application topically daily. Patient taking differently: Apply 1 Application topically as needed for irritation. 04/04/23  Yes Plotnikov, Aleksei V, MD  lanthanum (FOSRENOL) 1000 MG chewable tablet Chew 1,000-2,000 mg by mouth See admin instructions. Taking 2000 mg with meals and 1000 mg  with snacks   Yes [provider]  lidocaine  (LIDODERM ) 5 % Place 1 patch onto the skin as needed. Remove & Discard patch within 12 hours or as directed by MD   Yes [provider]  LORazepam   (ATIVAN ) 2 MG tablet Take 1 tablet (2 mg total) by mouth 2 (two) times daily as needed for anxiety. 03/14/24  Yes   midodrine  (PROAMATINE ) 10 MG tablet Take 1 tablet (10 mg total) by mouth in the morning, at noon, and at bedtime. 11/21/24  Yes Vann, Jessica U, DO  Multiple Vitamins-Minerals (PRESERVISION AREDS 2) CAPS Take 1 capsule by mouth in the morning and at bedtime.   Yes [provider]  naloxone  (NARCAN ) nasal spray 4 mg/0.1 mL Take by nasal route every 3 minutes until patient awakes or EMS arrives. 01/20/22  Yes   oxyCODONE  (ROXICODONE ) 15 MG immediate release tablet Take 1 tablet (15 mg total) by mouth 4 (four) times daily as needed. 02/22/24  Yes   pantoprazole  (PROTONIX ) 40 MG  tablet TAKE 1 TABLET BY MOUTH TWICE  DAILY 09/28/24  Yes Abran Norleen SAILOR, MD  pilocarpine  (SALAGEN ) 5 MG tablet TAKE 1 TABLET BY MOUTH TWICE  DAILY 05/24/24  Yes Plotnikov, Aleksei V, MD  pioglitazone  (ACTOS ) 15 MG tablet Take 1 tablet (15 mg total) by mouth daily. 10/19/24  Yes Plotnikov, Aleksei V, MD  polyethylene glycol (MIRALAX  / GLYCOLAX ) 17 g packet Take 17 g by mouth daily as needed for moderate constipation.   Yes [provider]  PREVIDENT 5000 DRY MOUTH 1.1 % GEL dental gel Place 1 application  onto teeth in the morning and at bedtime. 10/25/21  Yes [provider]  REPATHA  SURECLICK 140 MG/ML SOAJ INJECT 1 PEN SUBCUTANEOUSLY  EVERY 2 WEEKS 10/24/24  Yes Croitoru, Mihai, MD  Accu-Chek Softclix Lancets lancets Use 1 lancet up to 4 times daily as directed 03/12/22   Samtani, Jai-Gurmukh, MD  [Paused] Semaglutide ,0.25 or 0.5MG /DOS, (OZEMPIC , 0.25 OR 0.5 MG/DOSE,) 2 MG/3ML SOPN INJECT SUBCUTANEOUSLY 0.5 MG  EVERY WEEK Patient not taking: No sig reported Wait to take this until your doctor or other care provider tells you to start again. 11/04/24   Plotnikov, Aleksei V, MD    Current Facility-Administered Medications  Medication Dose Route Frequency Provider Last Rate Last Admin    acetaminophen  (TYLENOL ) tablet 650 mg  650 mg Oral Q6H PRN Patel, Vishal R, MD       Or   acetaminophen  (TYLENOL ) suppository 650 mg  650 mg Rectal Q6H PRN Patel, Vishal R, MD       buPROPion  (WELLBUTRIN  XL) 24 hr tablet 300 mg  300 mg Oral q AM Patel, Vishal R, MD   300 mg at 11/30/24 0935   [START ON 12/01/2024] cinacalcet  (SENSIPAR ) tablet 30 mg  30 mg Oral Q T,Th,Sa-HD Patel, Vishal R, MD       cyclobenzaprine  (FLEXERIL ) tablet 10 mg  10 mg Oral TID PRN Patel, Vishal R, MD       FLUoxetine  (PROZAC ) capsule 40 mg  40 mg Oral q AM Patel, Vishal R, MD   40 mg at 11/30/24 9063   insulin  aspart (novoLOG ) injection 0-9 Units  0-9 Units Subcutaneous Q4H Patel, Vishal R, MD   1 Units at 11/29/24 2109   LORazepam  (ATIVAN ) tablet 2 mg  2 mg Oral BID PRN Patel, Vishal R, MD       midodrine  (PROAMATINE ) tablet 10 mg  10 mg Oral TID Patel, Vishal R, MD   10 mg at 11/30/24 9063   ondansetron  (ZOFRAN ) tablet 4 mg  4 mg Oral Q6H PRN Patel, Vishal R, MD       Or   ondansetron  (ZOFRAN ) injection 4 mg  4 mg Intravenous Q6H PRN Patel, Vishal R, MD       oxyCODONE  (Oxy IR/ROXICODONE ) immediate release tablet 15 mg  15 mg Oral QID PRN Patel, Vishal R, MD       pantoprazole  (PROTONIX ) EC tablet 40 mg  40 mg Oral BID Patel, Vishal R, MD   40 mg at 11/30/24 9063   polyethylene glycol (MIRALAX  / GLYCOLAX ) packet 17 g  17 g Oral Daily PRN Patel, Vishal R, MD       senna-docusate (Senokot-S) tablet 1 tablet  1 tablet Oral QHS PRN Patel, Vishal R, MD       Current Outpatient Medications  Medication Sig Dispense Refill   acetaminophen  (TYLENOL ) 500 MG tablet Take 1,000 mg by mouth 2 (two) times daily as needed for headache.  acidophilus (RISAQUAD) CAPS capsule Take 2 capsules by mouth daily. (Patient taking differently: Take 1 capsule by mouth every evening.)     B Complex-C-Folic Acid  (RENA-VITE PO) Take 1 tablet by mouth every morning.     buPROPion  (WELLBUTRIN  XL) 300 MG 24 hr tablet Take 1 tablet (300 mg total)  by mouth in the morning. 90 tablet 1   carboxymethylcellulose (REFRESH PLUS) 0.5 % SOLN Place 1 drop into both eyes 3 (three) times daily as needed (dry eyes/irritation).     cinacalcet  (SENSIPAR ) 30 MG tablet Take 30 mg by mouth Every Tuesday,Thursday,and Saturday with dialysis.     cyclobenzaprine  (FLEXERIL ) 10 MG tablet Take 1 tablet (10 mg total) by mouth 3 (three) times daily as needed for muscle spasms. 270 tablet 0   diphenhydramine -acetaminophen  (TYLENOL  PM) 25-500 MG TABS tablet Take 2 tablets by mouth 3 (three) times a week. At bedtime (Patient taking differently: Take 2 tablets by mouth at bedtime as needed (pain and sleep).)     docusate sodium  (COLACE) 50 MG capsule Take 50-100 mg by mouth 2 (two) times daily. 1 qam, 2 qpm     FLUoxetine  (PROZAC ) 40 MG capsule Take 1 capsule (40 mg total) by mouth in the morning. 90 capsule 1   fluticasone  (FLONASE ) 50 MCG/ACT nasal spray Place 2 sprays into both nostrils daily. (Patient taking differently: Place 2 sprays into both nostrils as needed for allergies.) 16 g 6   glimepiride  (AMARYL ) 1 MG tablet Take 1 tablet (1 mg total) by mouth daily with breakfast. 100 tablet 2   ketoconazole  (NIZORAL ) 2 % cream Apply 1 Application topically daily. (Patient taking differently: Apply 1 Application topically as needed for irritation.) 60 g 3   lanthanum (FOSRENOL) 1000 MG chewable tablet Chew 1,000-2,000 mg by mouth See admin instructions. Taking 2000 mg with meals and 1000 mg  with snacks     lidocaine  (LIDODERM ) 5 % Place 1 patch onto the skin as needed. Remove & Discard patch within 12 hours or as directed by MD     LORazepam  (ATIVAN ) 2 MG tablet Take 1 tablet (2 mg total) by mouth 2 (two) times daily as needed for anxiety. 60 tablet 5   midodrine  (PROAMATINE ) 10 MG tablet Take 1 tablet (10 mg total) by mouth in the morning, at noon, and at bedtime.     Multiple Vitamins-Minerals (PRESERVISION AREDS 2) CAPS Take 1 capsule by mouth in the morning and at  bedtime.     naloxone  (NARCAN ) nasal spray 4 mg/0.1 mL Take by nasal route every 3 minutes until patient awakes or EMS arrives. 2 each 0   oxyCODONE  (ROXICODONE ) 15 MG immediate release tablet Take 1 tablet (15 mg total) by mouth 4 (four) times daily as needed. 120 tablet 0   pantoprazole  (PROTONIX ) 40 MG tablet TAKE 1 TABLET BY MOUTH TWICE  DAILY 200 tablet 2   pilocarpine  (SALAGEN ) 5 MG tablet TAKE 1 TABLET BY MOUTH TWICE  DAILY 60 tablet 11   pioglitazone  (ACTOS ) 15 MG tablet Take 1 tablet (15 mg total) by mouth daily. 100 tablet 2   polyethylene glycol (MIRALAX  / GLYCOLAX ) 17 g packet Take 17 g by mouth daily as needed for moderate constipation.     PREVIDENT 5000 DRY MOUTH 1.1 % GEL dental gel Place 1 application  onto teeth in the morning and at bedtime.     REPATHA  SURECLICK 140 MG/ML SOAJ INJECT 1 PEN SUBCUTANEOUSLY  EVERY 2 WEEKS 6 mL 3   Accu-Chek Softclix Lancets  lancets Use 1 lancet up to 4 times daily as directed 100 each 0   [Paused] Semaglutide ,0.25 or 0.5MG /DOS, (OZEMPIC , 0.25 OR 0.5 MG/DOSE,) 2 MG/3ML SOPN INJECT SUBCUTANEOUSLY 0.5 MG  EVERY WEEK (Patient not taking: No sig reported) 3 mL 1    Allergies as of 11/29/2024 - Review Complete 11/29/2024  Allergen Reaction Noted   Erythromycin  Diarrhea and Nausea And Vomiting    Gabapentin Other (See Comments) 09/05/2020   Lyrica [pregabalin] Other (See Comments) 11/29/2024   Other Itching and Other (See Comments) 11/29/2024    Family History  Problem Relation Age of Onset   Depression Mother    Hypertension Mother    Stroke Mother 26   Diabetes Mother    Diabetes Father    Hyperlipidemia Father    Hypertension Father    Crohn's disease Maternal Uncle    Diabetes Brother    Heart disease Brother    Hyperlipidemia Brother    Hypertension Brother    Prostate cancer Brother    Diabetes Brother    Heart disease Brother        Heart Disease before age 52   Heart attack Brother    Stroke Brother        X's 2    Hyperlipidemia Brother    Hypertension Brother    Diabetes Other    Colon cancer Neg Hx    Esophageal cancer Neg Hx    Stomach cancer Neg Hx    Pancreatic cancer Neg Hx    Breast cancer Neg Hx    BRCA 1/2 Neg Hx     Social History   Socioeconomic History   Marital status: Single    Spouse name: Not on file   Number of children: 0   Years of education: Not on file   Highest education level: Not on file  Occupational History   Not on file  Tobacco Use   Smoking status: Former    Current packs/day: 0.00    Average packs/day: 1 pack/day for 20.0 years (20.0 ttl pk-yrs)    Types: Cigarettes    Start date: 12/13/1968    Quit date: 12/13/1988    Years since quitting: 35.9   Smokeless tobacco: Never  Vaping Use   Vaping status: Never Used  Substance and Sexual Activity   Alcohol  use: Not Currently    Alcohol /week: 0.0 standard drinks of alcohol     Comment: rarely   Drug use: No   Sexual activity: Not Currently  Other Topics Concern   Not on file  Social History Narrative   Are you right handed or left handed? Right   Are you currently employed ?    What is your current occupation? Retired    Do you live at home alone?yes   Who lives with you?    What type of home do you live in: 1 story or 2 story? Apartment with elevator    Caffiene none   Social Drivers of Health   Tobacco Use: Medium Risk (11/29/2024)   Patient History    Smoking Tobacco Use: Former    Smokeless Tobacco Use: Never    Passive Exposure: Not on file  Financial Resource Strain: Low Risk (08/17/2024)   Overall Financial Resource Strain (CARDIA)    Difficulty of Paying Living Expenses: Not hard at all  Food Insecurity: No Food Insecurity (11/22/2024)   Epic    Worried About Radiation Protection Practitioner of Food in the Last Year: Never true    [employer] of Food in  the Last Year: Never true  Transportation Needs: No Transportation Needs (11/22/2024)   Epic    Lack of Transportation (Medical): No    Lack of Transportation  (Non-Medical): No  Physical Activity: Inactive (08/17/2024)   Exercise Vital Sign    Days of Exercise per Week: 0 days    Minutes of Exercise per Session: 0 min  Stress: No Stress Concern Present (08/17/2024)   [employer] of Occupational Health - Occupational Stress Questionnaire    Feeling of Stress: Not at all  Social Connections: Socially Isolated (11/17/2024)   Social Connection and Isolation Panel    Frequency of Communication with Friends and Family: More than three times a week    Frequency of Social Gatherings with Friends and Family: Twice a week    Attends Religious Services: Never    Database Administrator or Organizations: No    Attends Banker Meetings: Never    Marital Status: Never married  Intimate Partner Violence: Not At Risk (11/22/2024)   Epic    Fear of Current or Ex-Partner: No    Emotionally Abused: No    Physically Abused: No    Sexually Abused: No  Depression (PHQ2-9): Low Risk (11/22/2024)   Depression (PHQ2-9)    PHQ-2 Score: 0  Recent Concern: Depression (PHQ2-9) - Medium Risk (11/13/2024)   Depression (PHQ2-9)    PHQ-2 Score: 8  Alcohol  Screen: Low Risk (08/17/2024)   Alcohol  Screen    Last Alcohol  Screening Score (AUDIT): 0  Housing: Low Risk (11/22/2024)   Epic    Unable to Pay for Housing in the Last Year: No    Number of Times Moved in the Last Year: 0    Homeless in the Last Year: No  Utilities: Not At Risk (11/22/2024)   Epic    Threatened with loss of utilities: No  Health Literacy: Adequate Health Literacy (08/17/2024)   B1300 Health Literacy    Frequency of need for help with medical instructions: Never    Review of Systems: Pertinent positive and negative review of systems were noted in the above HPI section.  All other review of systems was otherwise negative.   Physical Exam: Vital signs in last 24 hours: Temp:  [97.5 F (36.4 C)-98.7 F (37.1 C)] 98.5 F (36.9 C) (12/19 1000) Pulse Rate:  [70-96] 80 (12/19  1000) Resp:  [11-24] 13 (12/19 1000) BP: (89-143)/(37-67) 117/55 (12/19 1000) SpO2:  [91 %-100 %] 94 % (12/19 1000) Weight:  [82.4 kg] 82.4 kg (12/18 1421) Last BM Date : 11/29/24 General:   Alert,  Well-developed, well-nourished, older white female pleasant and cooperative in NAD, distressed and anxious about her current situation Head:  Normocephalic and atraumatic. Eyes:  Sclera clear, no icterus.   Conjunctiva pink. Ears:  Normal auditory acuity. Nose:  No deformity, discharge,  or lesions. Mouth:  No deformity or lesions.   Neck:  Supple; no masses or thyromegaly. Lungs:  Clear throughout to auscultation.   No wheezes, crackles, or rhonchi.  Heart:  Regular rate and rhythm; no murmurs, clicks, rubs,  or gallops. Abdomen:  Soft,nontender, BS active,nonpalp mass or hsm.   Rectal: Not done Msk:  Symmetrical without gross deformities. . Pulses:  Normal pulses noted. Extremities:  Without clubbing or edema. Neurologic:  Alert and  oriented x4;  grossly normal neurologically. Skin:  Intact without significant lesions or rashes.. Psych:  Alert and cooperative. Normal mood and affect.  Intake/Output from previous day: No intake/output data recorded. Intake/Output this shift: No  intake/output data recorded.  Lab Results: Recent Labs    11/29/24 1432 11/29/24 2200 11/30/24 0403  WBC 13.5*  --  8.2  HGB 8.8* 7.6* 7.4*  HCT 28.9* 25.5* 24.4*  PLT 348  --  275   BMET Recent Labs    11/29/24 1432 11/30/24 0403  NA 137 136  K 3.7 3.5  CL 91* 92*  CO2 28 32  GLUCOSE 188* 93  BUN 11 17  CREATININE 2.20* 3.11*  CALCIUM 8.7* 8.3*   LFT Recent Labs    11/29/24 1432  PROT 7.3  ALBUMIN  4.2  AST 19  ALT 12  ALKPHOS 128*  BILITOT 0.3   PT/INR No results for input(s): LABPROT, INR in the last 72 hours. Hepatitis Panel No results for input(s): HEPBSAG, HCVAB, HEPAIGM, HEPBIGM in the last 72 hours.    IMPRESSION:  #79 73 year old white female with  end-stage renal disease on dialysis, now complicated by hypotension which had been repeatedly occurring during dialysis sessions.  Now on midodrine  10 mg 3 times daily. Patient presented to the emergency room yesterday evening after she had onset of severe abdominal cramping and discomfort just prior to her dialysis session yesterday.  Hours later this was followed by several bowel movements initially very dark/tarry appearing with clots and then becoming more grossly bloody.  Patient has history of what is felt to be right sided ischemic colitis and has had 2 recent admissions due to GI bleeding and exacerbations of abdominal pain related to this. CT angiography during her last admission earlier this month was consistent with what appears to be a right-sided ischemic colitis though other possible etiologies raised by radiology.  CTA repeated again last evening-large amount of residual contrast throughout the colon, persistent moderate narrowing noted involving the cecum which may represent focal inflammation or possible malignancy, mildly enlarged mesenteric nodes in the right lower quadrant which may be inflammatory/reactive but cannot rule out other etiology.  Hemoglobin has dropped to 7.4 this morning  She did have some soft blood pressures early this a.m. 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 Now with repeated hospitalizations will require endoscopic evaluation  #2 anemia acute on chronic secondary to above  #3 diabetes mellitus #4.  History of benign brain tumor status postresection #5.  Osteoarthritis  PLAN: Liquid diet today, n.p.o. past midnight Transfuse to keep her hemoglobin 7.5-8-ordered every 6 hour hemoglobins Will plan for colonoscopy tomorrow with biopsies, with Dr. Rollin, with bowel prep  this evening. Procedure was discussed in detail with the patient including indications risk and benefits and she is agreeable to proceed.  GI will follow with you    Kanan Sobek PA-C 11/30/2024, 10:59 AM    "

## 2024-11-30 NOTE — Progress Notes (Signed)
 " PROGRESS NOTE    [name]  FMW:[id number] DOB: 1951-02-06 DOA: 11/29/2024 PCP: Garald Karlynn GAILS, MD    Brief Narrative:  [name] is a 73 y.o. female with medical history significant for ESRD on TTS HD, T2DM, chronic hypotension, anemia of chronic disease, depression/anxiety, chronic pain, and history of recurrent ischemic colitis who is admitted with recurrent rectal bleeding in setting of persistent colitis.   Assessment and Plan:  Recurrent rectal bleeding in setting of persistent colitis Patient with 2 recent admissions for the same, both times managed conservatively with antibiotics.  She developed recurrent rectal bleeding the day prior to this admission with another episode after dialysis on the day of admission.  Repeat CTA A/P showed persistent moderate narrowing involving the cecum.  - Had another bloody bowel movement this morning - GI consulted, planning for colonoscopy tomorrow - Clear liquids today, NPO at midnight - Patient afebrile and hemodynamically stable with baseline BP, will hold off on systemic antibiotics for now  Blood loss anemia Anemia of chronic disease - Hemoglobin 8.8 on admission, down to 7.4 this morning in the setting of continued bloody stools - Repeat H&H at noon 7.2 - Ordered transfusion of 1 unit pRBC - Per GI, transfuse to maintain Hgb > 7.5-8  Chronic hypotension - BP low this morning but improving - Continue midodrine  10 mg 3 times daily   ESRD on TTS HD - Last HD session 12/18 - Nephrology consulted for routine HD - Continue cinacalcet  on TTS   Type 2 diabetes - Holding home meds - Continue SSI   Depression/anxiety - Continue bupropion , fluoxetine , Ativan  as needed.   Chronic pain - Continue home Oxy IR 15 mg 4 times daily as needed.   DVT prophylaxis: SCDs Start: 11/29/24 1923   Code Status:   Code Status: Do not attempt resuscitation (DNR) PRE-ARREST INTERVENTIONS DESIRED  Family Communication:  None  Disposition Plan: Home pending clinical improvement PT -   OT -     Level of care: Progressive  Consultants:  GI, nephrology  Procedures:  None  Antimicrobials: None   Subjective: Examined at bedside. Had bloody bowel movement last night and this morning. Ambulated to bathroom without issues. Abdominal pain has improved. Denies chest pain, shortness of breath, lightheadedness, dizziness, syncope.   Objective: Vitals:   11/30/24 0300 11/30/24 0555 11/30/24 0651 11/30/24 0730  BP: (!) 100/47 100/67  (!) 97/46  Pulse: 71 77  74  Resp: 12 13  11   Temp: 98 F (36.7 C)  98.7 F (37.1 C)   TempSrc:      SpO2: 95% 98%  91%  Weight:      Height:       No intake or output data in the 24 hours ending 11/30/24 0818 Filed Weights   11/29/24 1421  Weight: 82.4 kg    Examination:  Gen: NAD, A&Ox3 HEENT: NCAT, EOMI Neck: Supple, no JVD CV: RRR, no murmurs Resp: normal WOB, CTAB, no w/r/r Abd: Soft, NTND, no guarding, BS normoactive Ext: No LE edema Skin: Warm, dry, no rashes/lesions Neuro: No focal deficits Psych: Calm, cooperative, appropriate affect   Data Reviewed: I have personally reviewed following labs and imaging studies  CBC: Recent Labs  Lab 11/29/24 1432 11/29/24 2200 11/30/24 0403  WBC 13.5*  --  8.2  HGB 8.8* 7.6* 7.4*  HCT 28.9* 25.5* 24.4*  MCV 103.2*  --  103.0*  PLT 348  --  275   Basic Metabolic Panel: Recent Labs  Lab 11/29/24 1432 11/30/24 0403  NA 137 136  K 3.7 3.5  CL 91* 92*  CO2 28 32  GLUCOSE 188* 93  BUN 11 17  CREATININE 2.20* 3.11*  CALCIUM 8.7* 8.3*   GFR: Estimated Creatinine Clearance: 15.7 mL/min (A) (by C-G formula based on SCr of 3.11 mg/dL (H)). Liver Function Tests: Recent Labs  Lab 11/29/24 1432  AST 19  ALT 12  ALKPHOS 128*  BILITOT 0.3  PROT 7.3  ALBUMIN  4.2   Recent Labs  Lab 11/29/24 1432  LIPASE 19   No results for input(s): AMMONIA in the last 168 hours. Coagulation Profile: No  results for input(s): INR, PROTIME in the last 168 hours. Cardiac Enzymes: No results for input(s): CKTOTAL, CKMB, CKMBINDEX, TROPONINI in the last 168 hours. BNP (last 3 results) No results for input(s): PROBNP in the last 8760 hours. HbA1C: No results for input(s): HGBA1C in the last 72 hours. CBG: Recent Labs  Lab 11/29/24 2012 11/29/24 2326 11/30/24 0407 11/30/24 0743  GLUCAP 121* 75 87 98   Lipid Profile: No results for input(s): CHOL, HDL, LDLCALC, TRIG, CHOLHDL, LDLDIRECT in the last 72 hours. Thyroid  Function Tests: No results for input(s): TSH, T4TOTAL, FREET4, T3FREE, THYROIDAB in the last 72 hours. Anemia Panel: No results for input(s): VITAMINB12, FOLATE, FERRITIN, TIBC, IRON, RETICCTPCT in the last 72 hours. Sepsis Labs: No results for input(s): PROCALCITON, LATICACIDVEN in the last 168 hours.  No results found for this or any previous visit (from the past 240 hours).   Radiology Studies: CT Angio Abd/Pel W and/or Wo Contrast Result Date: 11/29/2024 CLINICAL DATA:  Chronic mesenteric ischemia.  Rectal bleeding. EXAM: CTA ABDOMEN AND PELVIS WITHOUT AND WITH CONTRAST TECHNIQUE: Multidetector CT imaging of the abdomen and pelvis was performed using the standard protocol during bolus administration of intravenous contrast. Multiplanar reconstructed images and MIPs were obtained and reviewed to evaluate the vascular anatomy. RADIATION DOSE REDUCTION: This exam was performed according to the departmental dose-optimization program which includes automated exposure control, adjustment of the mA and/or kV according to patient size and/or use of iterative reconstruction technique. CONTRAST:  85mL OMNIPAQUE  IOHEXOL  350 MG/ML SOLN COMPARISON:  Twelve days ago FINDINGS: VASCULAR Aorta: Normal caliber aorta without aneurysm, dissection, vasculitis or significant stenosis. Aortic atherosclerosis. Celiac: Patent without evidence of  aneurysm, dissection, vasculitis or significant stenosis. SMA: Patent without evidence of aneurysm, dissection, vasculitis or significant stenosis. Renals: Both renal arteries are patent without evidence of aneurysm or dissection, although they are diminutive in caliber consistent with history of end-stage renal disease. IMA: Patent without evidence of aneurysm, dissection, vasculitis or significant stenosis. Inflow: Patent without evidence of aneurysm, dissection, vasculitis or significant stenosis. Proximal Outflow: Bilateral common femoral and visualized portions of the superficial and profunda femoral arteries are patent without evidence of aneurysm, dissection, vasculitis or significant stenosis. Veins: No obvious venous abnormality within the limitations of this arterial phase study. Review of the MIP images confirms the above findings. NON-VASCULAR Lower chest: No acute abnormality. Hepatobiliary: No focal liver abnormality is seen. No gallstones, gallbladder wall thickening, or biliary dilatation. Pancreas: Unremarkable. No pancreatic ductal dilatation or surrounding inflammatory changes. Spleen: Normal in size without focal abnormality. Adrenals/Urinary Tract: Adrenal glands appear normal. Bilateral renal atrophy is noted with multiple cyst formation consistent history of end-stage renal disease. Urinary bladder is decompressed. Stomach/Bowel: Stomach is unremarkable. There is no evidence of bowel obstruction. Large amount of residual contrast is noted throughout the colon, preventing evaluation for active gastrointestinal bleeding. Persistent moderate narrowing  is seen involving the cecum which may represent focal inflammation or possibly colonic malignancy. Colonoscopy is recommended. Lymphatic: Mildly enlarged mesenteric lymph nodes are noted in the right lower quadrant which most likely are inflammatory or reactive in etiology, but metastatic disease cannot be excluded. Reproductive: Uterus and  bilateral adnexa are unremarkable. Other: Moderate size fat containing periumbilical hernia. No ascites. Musculoskeletal: No acute or significant osseous findings. IMPRESSION: 1. Persistent moderate narrowing is seen involving the cecum which may represent focal inflammation or possibly colonic malignancy. Colonoscopy is recommended. Mildly enlarged mesenteric lymph nodes are noted in the right lower quadrant which most likely are inflammatory or reactive in etiology, but metastatic disease cannot be excluded. 2. Large amount of residual contrast is noted throughout the colon, preventing evaluation for active gastrointestinal bleeding. 3.    Moderate size fat containing periumbilical hernia. Aortic Atherosclerosis (ICD10-I70.0). Electronically Signed   By: Lynwood Landy Raddle M.D.   On: 11/29/2024 16:10    Scheduled Meds:  buPROPion   300 mg Oral q AM   [START ON 12/01/2024] cinacalcet   30 mg Oral Q T,Th,Sa-HD   FLUoxetine   40 mg Oral q AM   insulin  aspart  0-9 Units Subcutaneous Q4H   midodrine   10 mg Oral TID   pantoprazole   40 mg Oral BID   Continuous Infusions:   Unresulted Labs (From admission, onward)     Start     Ordered   11/30/24 1200  Hemoglobin and hematocrit, blood  Once,   R        11/30/24 0758             LOS:  LOS: 0 days   Time Spent: 45 minutes  Kammie Scioli Al-Sultani, MD [facility]  If 7PM-7AM, please contact night-coverage  11/30/2024, 8:18 AM      "

## 2024-11-30 NOTE — Consult Note (Signed)
 "   Consultation  Referring Provider: TRH/Patel Primary Care Physician:  Garald Karlynn GAILS, MD Primary Gastroenterologist:  Dr.Perry  Reason for Consultation: Recurrent rectal bleeding, ischemic colitis  HPI: [name] is a 73 y.o. female with history of end-stage renal disease on dialysis, now with chronic issues with hypotension requiring midodrine  3 times daily, diabetes mellitus, history of benign brain tumor, status postresection ,chronic pain syndrome, fibromyalgia.  Patient has had 2 recent admissions with complications from what is felt to be right sided ischemic colitis and recurrent GI bleeding secondary to same.  She was just discharged on 11/21/2024.  Was made at that time to keep her on midodrine  10 mg 3 times daily, as she was having repeated episodes of hypotension primarily with dialysis but sometimes becoming hypotensive in between dialysis sessions and it was felt this may have been contributing to low flow state.  Did not undergo colonoscopies during those admissions because of right sided ischemic colitis.  CT angio on 11/17/2024 show any evidence of active extravasation, there was persistent circumferential colonic wall thickening involving the ascending colon with associated inflammation and lymphadenopathy inflammatory infectious versus neoplastic also noting diverticulosis without diverticulitis.  She improved with conservative management,. Patient says that she did okay for couple of days after discharge did not have any significant abdominal pain and did not have any evidence of active bleeding.  Says she tends to stay constipated and has pasty stools with her phosphorus binder. 2 days ago on Wednesday she says she had 4 bowel movements that appeared coffee ground blackish in appearance.  She went to dialysis yesterday but says she developed severe crampy abdominal pain while in the waiting room at dialysis and had urge for bowel movement but she says that she tries  not to let herself have bowel movements during dialysis if possible. After dialysis she says she went home and depends was full of very dark stool with clots.  She had a bowel movement appearing very similar.  She says she was feeling kind of dizzy and weak and laid down and may have fallen asleep.  She was trying to be seen by primary care yesterday but missed that appointment.  They directed her on to the ER because of the bleeding.  Yesterday afternoon she had 2 more bowel movements that contained more bright red blood and then has been 3 times since she has been in the ER with clots and bright red blood.  Repeat CTA last night showed patent mesenteric vessels, large amount of residual contrast throughout her colon preventing evaluation for active GI bleeding, there is persistent moderate narrowing seen involving the cecum which may represent focal inflammation or possibly colonic malignancy, there are also mildly enlarged mesenteric lymph nodes which may be reactive but could not rule out metastatic disease.  She has a fat-containing periumbilical hernia.  He is feeling better this morning is not having any significant cramping or abdominal pain, has been having clear liquids.  She is frustrated by her stay in the emergency room in which she could have had a bowel prep last night.  Labs this a.m. WBC 8.2/hemoglobin 7.4/hematocrit 24.4/MCV 103 down from hemoglobin of 8.8 on arrival Potassium 3.5/BUN 17/creatinine 3.11  Blood pressure 117/55    blood pressure early a.m. 86/52  Last colonoscopy had been done in 2023/Dr. Abran inadequate prep, diffuse severe mucosal changes at the hepatic flexure in the ascending colon and in the cecum secondary most likely to ischemic colitis biopsies were done, noted  diverticulosis in the rectosigmoid sigmoid and descending colon bleeding internal hemorrhoids. Biopsies at that time showed severe acute colitis with ulcerations and a few crystalline deposits  morphologically consistent with sevelomer crystals which apparently can induce ischemic colitis   Past Medical History:  Diagnosis Date   Anemia    Anxiety    Brain tumor (benign) ([name])    Colitis 2010   microscopic- Dr Abran   Depression    Diabetes mellitus    type II   Diverticulosis    Dyspnea    with exertion   ESRD (end stage renal disease) ([name])    TTUSAT [address]    Fibromyalgia    GERD (gastroesophageal reflux disease)    Headache    Hemodialysis patient    History of blood transfusion    after knee surgery   Hypertension    discontinued all diuretics and antihypertensives   IBS (irritable bowel syndrome)    LBP (low back pain)    Neuropathy    feet bilat    Osteoarthritis    Osteopenia    Pneumonia    hx of 2014    Rotator cuff tear, right    Sinusitis    currently being treated with antibiotic will complete 03/04/2015    Past Surgical History:  Procedure Laterality Date   A/V FISTULAGRAM N/A 02/03/2024   Procedure: A/V Fistulagram;  Surgeon: Norine Manuelita LABOR, MD;  Location: [facility];  Service: Cardiovascular;  Laterality: N/A;   A/V SHUNT INTERVENTION Right 05/30/2024   Procedure: A/V SHUNT INTERVENTION;  Surgeon: Pearline Norman RAMAN, MD;  Location: [facility];  Service: Cardiovascular;  Laterality: Right;   A/V SHUNT INTERVENTION N/A 09/07/2024   Procedure: A/V SHUNT INTERVENTION;  Surgeon: Lanis Fonda BRAVO, MD;  Location: [facility];  Service: Cardiovascular;  Laterality: N/A;   AV FISTULA PLACEMENT Right 09/12/2020   Procedure: RIGHT ARTERIOVENOUS (AV) FISTULA CREATION;  Surgeon: Harvey Carlin BRAVO, MD;  Location: [facility] OR;  Service: Vascular;  Laterality: Right;   BASCILIC VEIN TRANSPOSITION Right 10/27/2020   Procedure: RIGHT UPPER EXTREMITY SECOND STAGE BASCILIC VEIN TRANSPOSITION;  Surgeon: Harvey Carlin BRAVO, MD;  Location: [facility] OR;  Service: Vascular;  Laterality: Right;   BIOPSY  03/11/2022   Procedure: BIOPSY;  Surgeon: Wilhelmenia Aloha Raddle.,  MD;  Location: [facility] ENDOSCOPY;  Service: Gastroenterology;;   COLONOSCOPY WITH PROPOFOL  N/A 03/11/2022   Procedure: COLONOSCOPY WITH PROPOFOL ;  Surgeon: Wilhelmenia Aloha Raddle., MD;  Location: [facility] ENDOSCOPY;  Service: Gastroenterology;  Laterality: N/A;   foramen magnum ependymoma surgery  2003   Dr Mora   JOINT REPLACEMENT Bilateral    NASAL SINUS SURGERY     1973    PERIPHERAL VASCULAR BALLOON ANGIOPLASTY  02/03/2024   Procedure: PERIPHERAL VASCULAR BALLOON ANGIOPLASTY;  Surgeon: Norine Manuelita LABOR, MD;  Location: [facility];  Service: Cardiovascular;;  Inflow Basilic Vein   REVERSE SHOULDER ARTHROPLASTY Right 06/03/2022   Procedure: REVERSE SHOULDER ARTHROPLASTY;  Surgeon: Melita Drivers, MD;  Location: [facility] ORS;  Service: Orthopedics;  Laterality: Right;    TONSILLECTOMY     TOTAL KNEE ARTHROPLASTY     L 2008, R 2009, R 2016- Dr hiram   TOTAL KNEE REVISION Right 03/05/2015   Procedure: RIGHT TOTAL KNEE ARTHROPLASTY REVISION;  Surgeon: Dempsey Moan, MD;  Location: [facility] ORS;  Service: Orthopedics;  Laterality: Right;   VENOUS ANGIOPLASTY  05/30/2024   Procedure: VENOUS ANGIOPLASTY;  Surgeon: Pearline Norman RAMAN, MD;  Location: [facility];  Service:  Cardiovascular;;  central   VENOUS ANGIOPLASTY  09/07/2024   Procedure: VENOUS ANGIOPLASTY;  Surgeon: Lanis Fonda BRAVO, MD;  Location: [facility];  Service: Cardiovascular;;  innominate 70%    Prior to Admission medications  Medication Sig Start Date End Date Taking? Authorizing Provider  acetaminophen  (TYLENOL ) 500 MG tablet Take 1,000 mg by mouth 2 (two) times daily as needed for headache.   Yes [provider]  acidophilus (RISAQUAD) CAPS capsule Take 2 capsules by mouth daily. Patient taking differently: Take 1 capsule by mouth every evening. 11/22/24  Yes Vann, Jessica U, DO  B Complex-C-Folic Acid  (RENA-VITE PO) Take 1 tablet by mouth every morning.   Yes [provider]  buPROPion  (WELLBUTRIN  XL) 300 MG 24 hr  tablet Take 1 tablet (300 mg total) by mouth in the morning. 10/19/24  Yes Plotnikov, Aleksei V, MD  carboxymethylcellulose (REFRESH PLUS) 0.5 % SOLN Place 1 drop into both eyes 3 (three) times daily as needed (dry eyes/irritation).   Yes [provider]  cinacalcet  (SENSIPAR ) 30 MG tablet Take 30 mg by mouth Every Tuesday,Thursday,and Saturday with dialysis.   Yes [provider]  cyclobenzaprine  (FLEXERIL ) 10 MG tablet Take 1 tablet (10 mg total) by mouth 3 (three) times daily as needed for muscle spasms. 09/12/24  Yes Plotnikov, Karlynn GAILS, MD  diphenhydramine -acetaminophen  (TYLENOL  PM) 25-500 MG TABS tablet Take 2 tablets by mouth 3 (three) times a week. At bedtime Patient taking differently: Take 2 tablets by mouth at bedtime as needed (pain and sleep).   Yes [provider]  docusate sodium  (COLACE) 50 MG capsule Take 50-100 mg by mouth 2 (two) times daily. 1 qam, 2 qpm   Yes [provider]  FLUoxetine  (PROZAC ) 40 MG capsule Take 1 capsule (40 mg total) by mouth in the morning. 08/26/23  Yes   fluticasone  (FLONASE ) 50 MCG/ACT nasal spray Place 2 sprays into both nostrils daily. Patient taking differently: Place 2 sprays into both nostrils as needed for allergies. 11/30/23  Yes Soldatova, Liuba, MD  glimepiride  (AMARYL ) 1 MG tablet Take 1 tablet (1 mg total) by mouth daily with breakfast. 10/19/24  Yes Plotnikov, Karlynn GAILS, MD  ketoconazole  (NIZORAL ) 2 % cream Apply 1 Application topically daily. Patient taking differently: Apply 1 Application topically as needed for irritation. 04/04/23  Yes Plotnikov, Aleksei V, MD  lanthanum (FOSRENOL) 1000 MG chewable tablet Chew 1,000-2,000 mg by mouth See admin instructions. Taking 2000 mg with meals and 1000 mg  with snacks   Yes [provider]  lidocaine  (LIDODERM ) 5 % Place 1 patch onto the skin as needed. Remove & Discard patch within 12 hours or as directed by MD   Yes [provider]  LORazepam   (ATIVAN ) 2 MG tablet Take 1 tablet (2 mg total) by mouth 2 (two) times daily as needed for anxiety. 03/14/24  Yes   midodrine  (PROAMATINE ) 10 MG tablet Take 1 tablet (10 mg total) by mouth in the morning, at noon, and at bedtime. 11/21/24  Yes Vann, Jessica U, DO  Multiple Vitamins-Minerals (PRESERVISION AREDS 2) CAPS Take 1 capsule by mouth in the morning and at bedtime.   Yes [provider]  naloxone  (NARCAN ) nasal spray 4 mg/0.1 mL Take by nasal route every 3 minutes until patient awakes or EMS arrives. 01/20/22  Yes   oxyCODONE  (ROXICODONE ) 15 MG immediate release tablet Take 1 tablet (15 mg total) by mouth 4 (four) times daily as needed. 02/22/24  Yes   pantoprazole  (PROTONIX ) 40 MG  tablet TAKE 1 TABLET BY MOUTH TWICE  DAILY 09/28/24  Yes Abran Norleen SAILOR, MD  pilocarpine  (SALAGEN ) 5 MG tablet TAKE 1 TABLET BY MOUTH TWICE  DAILY 05/24/24  Yes Plotnikov, Aleksei V, MD  pioglitazone  (ACTOS ) 15 MG tablet Take 1 tablet (15 mg total) by mouth daily. 10/19/24  Yes Plotnikov, Aleksei V, MD  polyethylene glycol (MIRALAX  / GLYCOLAX ) 17 g packet Take 17 g by mouth daily as needed for moderate constipation.   Yes [provider]  PREVIDENT 5000 DRY MOUTH 1.1 % GEL dental gel Place 1 application  onto teeth in the morning and at bedtime. 10/25/21  Yes [provider]  REPATHA  SURECLICK 140 MG/ML SOAJ INJECT 1 PEN SUBCUTANEOUSLY  EVERY 2 WEEKS 10/24/24  Yes Croitoru, Mihai, MD  Accu-Chek Softclix Lancets lancets Use 1 lancet up to 4 times daily as directed 03/12/22   Samtani, Jai-Gurmukh, MD  [Paused] Semaglutide ,0.25 or 0.5MG /DOS, (OZEMPIC , 0.25 OR 0.5 MG/DOSE,) 2 MG/3ML SOPN INJECT SUBCUTANEOUSLY 0.5 MG  EVERY WEEK Patient not taking: No sig reported Wait to take this until your doctor or other care provider tells you to start again. 11/04/24   Plotnikov, Aleksei V, MD    Current Facility-Administered Medications  Medication Dose Route Frequency Provider Last Rate Last Admin    acetaminophen  (TYLENOL ) tablet 650 mg  650 mg Oral Q6H PRN Patel, Vishal R, MD       Or   acetaminophen  (TYLENOL ) suppository 650 mg  650 mg Rectal Q6H PRN Patel, Vishal R, MD       buPROPion  (WELLBUTRIN  XL) 24 hr tablet 300 mg  300 mg Oral q AM Patel, Vishal R, MD   300 mg at 11/30/24 0935   [START ON 12/01/2024] cinacalcet  (SENSIPAR ) tablet 30 mg  30 mg Oral Q T,Th,Sa-HD Patel, Vishal R, MD       cyclobenzaprine  (FLEXERIL ) tablet 10 mg  10 mg Oral TID PRN Patel, Vishal R, MD       FLUoxetine  (PROZAC ) capsule 40 mg  40 mg Oral q AM Patel, Vishal R, MD   40 mg at 11/30/24 9063   insulin  aspart (novoLOG ) injection 0-9 Units  0-9 Units Subcutaneous Q4H Patel, Vishal R, MD   1 Units at 11/29/24 2109   LORazepam  (ATIVAN ) tablet 2 mg  2 mg Oral BID PRN Patel, Vishal R, MD       midodrine  (PROAMATINE ) tablet 10 mg  10 mg Oral TID Patel, Vishal R, MD   10 mg at 11/30/24 9063   ondansetron  (ZOFRAN ) tablet 4 mg  4 mg Oral Q6H PRN Patel, Vishal R, MD       Or   ondansetron  (ZOFRAN ) injection 4 mg  4 mg Intravenous Q6H PRN Patel, Vishal R, MD       oxyCODONE  (Oxy IR/ROXICODONE ) immediate release tablet 15 mg  15 mg Oral QID PRN Patel, Vishal R, MD       pantoprazole  (PROTONIX ) EC tablet 40 mg  40 mg Oral BID Patel, Vishal R, MD   40 mg at 11/30/24 9063   polyethylene glycol (MIRALAX  / GLYCOLAX ) packet 17 g  17 g Oral Daily PRN Patel, Vishal R, MD       senna-docusate (Senokot-S) tablet 1 tablet  1 tablet Oral QHS PRN Patel, Vishal R, MD       Current Outpatient Medications  Medication Sig Dispense Refill   acetaminophen  (TYLENOL ) 500 MG tablet Take 1,000 mg by mouth 2 (two) times daily as needed for headache.  acidophilus (RISAQUAD) CAPS capsule Take 2 capsules by mouth daily. (Patient taking differently: Take 1 capsule by mouth every evening.)     B Complex-C-Folic Acid  (RENA-VITE PO) Take 1 tablet by mouth every morning.     buPROPion  (WELLBUTRIN  XL) 300 MG 24 hr tablet Take 1 tablet (300 mg total)  by mouth in the morning. 90 tablet 1   carboxymethylcellulose (REFRESH PLUS) 0.5 % SOLN Place 1 drop into both eyes 3 (three) times daily as needed (dry eyes/irritation).     cinacalcet  (SENSIPAR ) 30 MG tablet Take 30 mg by mouth Every Tuesday,Thursday,and Saturday with dialysis.     cyclobenzaprine  (FLEXERIL ) 10 MG tablet Take 1 tablet (10 mg total) by mouth 3 (three) times daily as needed for muscle spasms. 270 tablet 0   diphenhydramine -acetaminophen  (TYLENOL  PM) 25-500 MG TABS tablet Take 2 tablets by mouth 3 (three) times a week. At bedtime (Patient taking differently: Take 2 tablets by mouth at bedtime as needed (pain and sleep).)     docusate sodium  (COLACE) 50 MG capsule Take 50-100 mg by mouth 2 (two) times daily. 1 qam, 2 qpm     FLUoxetine  (PROZAC ) 40 MG capsule Take 1 capsule (40 mg total) by mouth in the morning. 90 capsule 1   fluticasone  (FLONASE ) 50 MCG/ACT nasal spray Place 2 sprays into both nostrils daily. (Patient taking differently: Place 2 sprays into both nostrils as needed for allergies.) 16 g 6   glimepiride  (AMARYL ) 1 MG tablet Take 1 tablet (1 mg total) by mouth daily with breakfast. 100 tablet 2   ketoconazole  (NIZORAL ) 2 % cream Apply 1 Application topically daily. (Patient taking differently: Apply 1 Application topically as needed for irritation.) 60 g 3   lanthanum (FOSRENOL) 1000 MG chewable tablet Chew 1,000-2,000 mg by mouth See admin instructions. Taking 2000 mg with meals and 1000 mg  with snacks     lidocaine  (LIDODERM ) 5 % Place 1 patch onto the skin as needed. Remove & Discard patch within 12 hours or as directed by MD     LORazepam  (ATIVAN ) 2 MG tablet Take 1 tablet (2 mg total) by mouth 2 (two) times daily as needed for anxiety. 60 tablet 5   midodrine  (PROAMATINE ) 10 MG tablet Take 1 tablet (10 mg total) by mouth in the morning, at noon, and at bedtime.     Multiple Vitamins-Minerals (PRESERVISION AREDS 2) CAPS Take 1 capsule by mouth in the morning and at  bedtime.     naloxone  (NARCAN ) nasal spray 4 mg/0.1 mL Take by nasal route every 3 minutes until patient awakes or EMS arrives. 2 each 0   oxyCODONE  (ROXICODONE ) 15 MG immediate release tablet Take 1 tablet (15 mg total) by mouth 4 (four) times daily as needed. 120 tablet 0   pantoprazole  (PROTONIX ) 40 MG tablet TAKE 1 TABLET BY MOUTH TWICE  DAILY 200 tablet 2   pilocarpine  (SALAGEN ) 5 MG tablet TAKE 1 TABLET BY MOUTH TWICE  DAILY 60 tablet 11   pioglitazone  (ACTOS ) 15 MG tablet Take 1 tablet (15 mg total) by mouth daily. 100 tablet 2   polyethylene glycol (MIRALAX  / GLYCOLAX ) 17 g packet Take 17 g by mouth daily as needed for moderate constipation.     PREVIDENT 5000 DRY MOUTH 1.1 % GEL dental gel Place 1 application  onto teeth in the morning and at bedtime.     REPATHA  SURECLICK 140 MG/ML SOAJ INJECT 1 PEN SUBCUTANEOUSLY  EVERY 2 WEEKS 6 mL 3   Accu-Chek Softclix Lancets  lancets Use 1 lancet up to 4 times daily as directed 100 each 0   [Paused] Semaglutide ,0.25 or 0.5MG /DOS, (OZEMPIC , 0.25 OR 0.5 MG/DOSE,) 2 MG/3ML SOPN INJECT SUBCUTANEOUSLY 0.5 MG  EVERY WEEK (Patient not taking: No sig reported) 3 mL 1    Allergies as of 11/29/2024 - Review Complete 11/29/2024  Allergen Reaction Noted   Erythromycin  Diarrhea and Nausea And Vomiting    Gabapentin Other (See Comments) 09/05/2020   Lyrica [pregabalin] Other (See Comments) 11/29/2024   Other Itching and Other (See Comments) 11/29/2024    Family History  Problem Relation Age of Onset   Depression Mother    Hypertension Mother    Stroke Mother 35   Diabetes Mother    Diabetes Father    Hyperlipidemia Father    Hypertension Father    Crohn's disease Maternal Uncle    Diabetes Brother    Heart disease Brother    Hyperlipidemia Brother    Hypertension Brother    Prostate cancer Brother    Diabetes Brother    Heart disease Brother        Heart Disease before age 56   Heart attack Brother    Stroke Brother        X's 2    Hyperlipidemia Brother    Hypertension Brother    Diabetes Other    Colon cancer Neg Hx    Esophageal cancer Neg Hx    Stomach cancer Neg Hx    Pancreatic cancer Neg Hx    Breast cancer Neg Hx    BRCA 1/2 Neg Hx     Social History   Socioeconomic History   Marital status: Single    Spouse name: Not on file   Number of children: 0   Years of education: Not on file   Highest education level: Not on file  Occupational History   Not on file  Tobacco Use   Smoking status: Former    Current packs/day: 0.00    Average packs/day: 1 pack/day for 20.0 years (20.0 ttl pk-yrs)    Types: Cigarettes    Start date: 12/13/1968    Quit date: 12/13/1988    Years since quitting: 35.9   Smokeless tobacco: Never  Vaping Use   Vaping status: Never Used  Substance and Sexual Activity   Alcohol  use: Not Currently    Alcohol /week: 0.0 standard drinks of alcohol     Comment: rarely   Drug use: No   Sexual activity: Not Currently  Other Topics Concern   Not on file  Social History Narrative   Are you right handed or left handed? Right   Are you currently employed ?    What is your current occupation? Retired    Do you live at home alone?yes   Who lives with you?    What type of home do you live in: 1 story or 2 story? Apartment with elevator    Caffiene none   Social Drivers of Health   Tobacco Use: Medium Risk (11/29/2024)   Patient History    Smoking Tobacco Use: Former    Smokeless Tobacco Use: Never    Passive Exposure: Not on file  Financial Resource Strain: Low Risk (08/17/2024)   Overall Financial Resource Strain (CARDIA)    Difficulty of Paying Living Expenses: Not hard at all  Food Insecurity: No Food Insecurity (11/22/2024)   Epic    Worried About Radiation Protection Practitioner of Food in the Last Year: Never true    [employer] of Food in  the Last Year: Never true  Transportation Needs: No Transportation Needs (11/22/2024)   Epic    Lack of Transportation (Medical): No    Lack of Transportation  (Non-Medical): No  Physical Activity: Inactive (08/17/2024)   Exercise Vital Sign    Days of Exercise per Week: 0 days    Minutes of Exercise per Session: 0 min  Stress: No Stress Concern Present (08/17/2024)   [employer] of Occupational Health - Occupational Stress Questionnaire    Feeling of Stress: Not at all  Social Connections: Socially Isolated (11/17/2024)   Social Connection and Isolation Panel    Frequency of Communication with Friends and Family: More than three times a week    Frequency of Social Gatherings with Friends and Family: Twice a week    Attends Religious Services: Never    Database Administrator or Organizations: No    Attends Banker Meetings: Never    Marital Status: Never married  Intimate Partner Violence: Not At Risk (11/22/2024)   Epic    Fear of Current or Ex-Partner: No    Emotionally Abused: No    Physically Abused: No    Sexually Abused: No  Depression (PHQ2-9): Low Risk (11/22/2024)   Depression (PHQ2-9)    PHQ-2 Score: 0  Recent Concern: Depression (PHQ2-9) - Medium Risk (11/13/2024)   Depression (PHQ2-9)    PHQ-2 Score: 8  Alcohol  Screen: Low Risk (08/17/2024)   Alcohol  Screen    Last Alcohol  Screening Score (AUDIT): 0  Housing: Low Risk (11/22/2024)   Epic    Unable to Pay for Housing in the Last Year: No    Number of Times Moved in the Last Year: 0    Homeless in the Last Year: No  Utilities: Not At Risk (11/22/2024)   Epic    Threatened with loss of utilities: No  Health Literacy: Adequate Health Literacy (08/17/2024)   B1300 Health Literacy    Frequency of need for help with medical instructions: Never    Review of Systems: Pertinent positive and negative review of systems were noted in the above HPI section.  All other review of systems was otherwise negative.   Physical Exam: Vital signs in last 24 hours: Temp:  [97.5 F (36.4 C)-98.7 F (37.1 C)] 98.5 F (36.9 C) (12/19 1000) Pulse Rate:  [70-96] 80 (12/19  1000) Resp:  [11-24] 13 (12/19 1000) BP: (89-143)/(37-67) 117/55 (12/19 1000) SpO2:  [91 %-100 %] 94 % (12/19 1000) Weight:  [82.4 kg] 82.4 kg (12/18 1421) Last BM Date : 11/29/24 General:   Alert,  Well-developed, well-nourished, older white female pleasant and cooperative in NAD, distressed and anxious about her current situation Head:  Normocephalic and atraumatic. Eyes:  Sclera clear, no icterus.   Conjunctiva pink. Ears:  Normal auditory acuity. Nose:  No deformity, discharge,  or lesions. Mouth:  No deformity or lesions.   Neck:  Supple; no masses or thyromegaly. Lungs:  Clear throughout to auscultation.   No wheezes, crackles, or rhonchi.  Heart:  Regular rate and rhythm; no murmurs, clicks, rubs,  or gallops. Abdomen:  Soft,nontender, BS active,nonpalp mass or hsm.   Rectal: Not done Msk:  Symmetrical without gross deformities. . Pulses:  Normal pulses noted. Extremities:  Without clubbing or edema. Neurologic:  Alert and  oriented x4;  grossly normal neurologically. Skin:  Intact without significant lesions or rashes.. Psych:  Alert and cooperative. Normal mood and affect.  Intake/Output from previous day: No intake/output data recorded. Intake/Output this shift: No  intake/output data recorded.  Lab Results: Recent Labs    11/29/24 1432 11/29/24 2200 11/30/24 0403  WBC 13.5*  --  8.2  HGB 8.8* 7.6* 7.4*  HCT 28.9* 25.5* 24.4*  PLT 348  --  275   BMET Recent Labs    11/29/24 1432 11/30/24 0403  NA 137 136  K 3.7 3.5  CL 91* 92*  CO2 28 32  GLUCOSE 188* 93  BUN 11 17  CREATININE 2.20* 3.11*  CALCIUM 8.7* 8.3*   LFT Recent Labs    11/29/24 1432  PROT 7.3  ALBUMIN  4.2  AST 19  ALT 12  ALKPHOS 128*  BILITOT 0.3   PT/INR No results for input(s): LABPROT, INR in the last 72 hours. Hepatitis Panel No results for input(s): HEPBSAG, HCVAB, HEPAIGM, HEPBIGM in the last 72 hours.    IMPRESSION:  #60 73 year old white female with  end-stage renal disease on dialysis, now complicated by hypotension which had been repeatedly occurring during dialysis sessions.  Now on midodrine  10 mg 3 times daily. Patient presented to the emergency room yesterday evening after she had onset of severe abdominal cramping and discomfort just prior to her dialysis session yesterday.  Hours later this was followed by several bowel movements initially very dark/tarry appearing with clots and then becoming more grossly bloody.  Patient has history of what is felt to be right sided ischemic colitis and has had 2 recent admissions due to GI bleeding and exacerbations of abdominal pain related to this. CT angiography during her last admission earlier this month was consistent with what appears to be a right-sided ischemic colitis though other possible etiologies raised by radiology.  CTA repeated again last evening-large amount of residual contrast throughout the colon, persistent moderate narrowing noted involving the cecum which may represent focal inflammation or possible malignancy, mildly enlarged mesenteric nodes in the right lower quadrant which may be inflammatory/reactive but cannot rule out other etiology.  Hemoglobin has dropped to 7.4 this morning  She did have some soft blood pressures early this a.m. 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 Now with repeated hospitalizations will require endoscopic evaluation  #2 anemia acute on chronic secondary to above  #3 diabetes mellitus #4.  History of benign brain tumor status postresection #5.  Osteoarthritis  PLAN: Liquid diet today, n.p.o. past midnight Transfuse to keep her hemoglobin 7.5-8-ordered every 6 hour hemoglobins Will plan for colonoscopy tomorrow with biopsies, with Dr. Rollin, with bowel prep  this evening. Procedure was discussed in detail with the patient including indications risk and benefits and she is agreeable to proceed.  GI will follow with you    Alyssa Mancera PA-C 11/30/2024, 10:59 AM    "

## 2024-11-30 NOTE — ED Notes (Signed)
 Pt ambulatory to restroom w/o assistance. Pt declined yellow fall risk socks.

## 2024-11-30 NOTE — ED Notes (Signed)
 Handoff report given to [facility]

## 2024-11-30 NOTE — Consult Note (Signed)
 Renal Service Consult Note [facility] Lamar JONETTA Fret, MD  Patient: [name] Date: 11/30/2024 Requesting Physician: Dr. Mosie  Reason for Consult: ESRD pt w/ rectal bleeding HPI: The patient is a 73 y.o. year-old w/ PMH as below who presented to ED yesterday afternoon 11/29/2024 complaining of rectal bleeding.  She was recently discharged on Monday 4 days ago for a similar problem.  In the ED BP was 110-140/45-55, HR 80, RR 14, temp 98.  98 % O2 sat on room air.  Labs showed K+ 3.5, BUN 17 and creatinine 3.1.  Hgb 7.4, WBC 8K.  FOBT was positive. CTA of abd/ pelvis showed persistent narrowing involving the cecum which may represent focal inflammation or possibly malignancy.  Patient received midodrine , p.o. Protonix , and insulin .  Patient was admitted to the hospitalist service.  They are asked to see for dialysis   Pt seen in ED.  Patient's complaints are similar to above, main issues are rectal bleeding.  She does not know if she has Crohn's disease or any inflammatory bowel disease, however she said it does run in the family.  Denies any shortness of breath or leg swelling.  Her weight has been going up and down she says, her dry weight is related to dialysis.  Her last dialysis was Tuesday.   ROS - denies CP, no joint pain, no HA, no blurry vision, no rash, no diarrhea, no nausea/ vomiting   Past Medical History  Past Medical History:  Diagnosis Date   Anemia    Anxiety    Brain tumor (benign) ([name])    Colitis 2010   microscopic- Dr Abran   Depression    Diabetes mellitus    type II   Diverticulosis    Dyspnea    with exertion   ESRD (end stage renal disease) ([name])    TTUSAT [address]    Fibromyalgia    GERD (gastroesophageal reflux disease)    Headache    Hemodialysis patient    History of blood transfusion    after knee surgery   Hypertension    discontinued all diuretics and antihypertensives   IBS (irritable bowel syndrome)    LBP (low  back pain)    Neuropathy    feet bilat    Osteoarthritis    Osteopenia    Pneumonia    hx of 2014    Rotator cuff tear, right    Sinusitis    currently being treated with antibiotic will complete 03/04/2015   Past Surgical History  Past Surgical History:  Procedure Laterality Date   A/V FISTULAGRAM N/A 02/03/2024   Procedure: A/V Fistulagram;  Surgeon: Norine Manuelita LABOR, MD;  Location: [facility] INVASIVE [facility] LAB;  Service: Cardiovascular;  Laterality: N/A;   A/V SHUNT INTERVENTION Right 05/30/2024   Procedure: A/V SHUNT INTERVENTION;  Surgeon: Pearline Norman RAMAN, MD;  Location: [facility] PV LAB;  Service: Cardiovascular;  Laterality: Right;   A/V SHUNT INTERVENTION N/A 09/07/2024   Procedure: A/V SHUNT INTERVENTION;  Surgeon: Lanis Fonda BRAVO, MD;  Location: [facility];  Service: Cardiovascular;  Laterality: N/A;   AV FISTULA PLACEMENT Right 09/12/2020   Procedure: RIGHT ARTERIOVENOUS (AV) FISTULA CREATION;  Surgeon: Harvey Carlin BRAVO, MD;  Location: [facility] OR;  Service: Vascular;  Laterality: Right;   BASCILIC VEIN TRANSPOSITION Right 10/27/2020   Procedure: RIGHT UPPER EXTREMITY SECOND STAGE BASCILIC VEIN TRANSPOSITION;  Surgeon: Harvey Carlin BRAVO, MD;  Location: [facility] OR;  Service: Vascular;  Laterality: Right;   BIOPSY  03/11/2022  Procedure: BIOPSY;  Surgeon: Wilhelmenia Aloha Raddle., MD;  Location: [facility] ENDOSCOPY;  Service: Gastroenterology;;   COLONOSCOPY WITH PROPOFOL  N/A 03/11/2022   Procedure: COLONOSCOPY WITH PROPOFOL ;  Surgeon: Wilhelmenia Aloha Raddle., MD;  Location: [facility] ENDOSCOPY;  Service: Gastroenterology;  Laterality: N/A;   foramen magnum ependymoma surgery  2003   Dr Mora   JOINT REPLACEMENT Bilateral    NASAL SINUS SURGERY     1973    PERIPHERAL VASCULAR BALLOON ANGIOPLASTY  02/03/2024   Procedure: PERIPHERAL VASCULAR BALLOON ANGIOPLASTY;  Surgeon: Norine Manuelita LABOR, MD;  Location: [facility];  Service: Cardiovascular;;  Inflow Basilic Vein   REVERSE SHOULDER ARTHROPLASTY Right  06/03/2022   Procedure: REVERSE SHOULDER ARTHROPLASTY;  Surgeon: Melita Drivers, MD;  Location: [facility] ORS;  Service: Orthopedics;  Laterality: Right;    TONSILLECTOMY     TOTAL KNEE ARTHROPLASTY     L 2008, R 2009, R 2016- Dr hiram   TOTAL KNEE REVISION Right 03/05/2015   Procedure: RIGHT TOTAL KNEE ARTHROPLASTY REVISION;  Surgeon: Dempsey Moan, MD;  Location: [facility] ORS;  Service: Orthopedics;  Laterality: Right;   VENOUS ANGIOPLASTY  05/30/2024   Procedure: VENOUS ANGIOPLASTY;  Surgeon: Pearline Norman RAMAN, MD;  Location: [facility];  Service: Cardiovascular;;  central   VENOUS ANGIOPLASTY  09/07/2024   Procedure: VENOUS ANGIOPLASTY;  Surgeon: Lanis Fonda BRAVO, MD;  Location: [facility];  Service: Cardiovascular;;  innominate 70%   Family History  Family History  Problem Relation Age of Onset   Depression Mother    Hypertension Mother    Stroke Mother 88   Diabetes Mother    Diabetes Father    Hyperlipidemia Father    Hypertension Father    Crohn's disease Maternal Uncle    Diabetes Brother    Heart disease Brother    Hyperlipidemia Brother    Hypertension Brother    Prostate cancer Brother    Diabetes Brother    Heart disease Brother        Heart Disease before age 51   Heart attack Brother    Stroke Brother        X's 2   Hyperlipidemia Brother    Hypertension Brother    Diabetes Other    Colon cancer Neg Hx    Esophageal cancer Neg Hx    Stomach cancer Neg Hx    Pancreatic cancer Neg Hx    Breast cancer Neg Hx    BRCA 1/2 Neg Hx    Social History  reports that she quit smoking about 35 years ago. Her smoking use included cigarettes. She started smoking about 56 years ago. She has a 20 pack-year smoking history. She has never used smokeless tobacco. She reports that she does not currently use alcohol . She reports that she does not use drugs. Allergies Allergies[1] Home medications Prior to Admission medications  Medication Sig Start Date End Date Taking? Authorizing  Provider  acetaminophen  (TYLENOL ) 500 MG tablet Take 1,000 mg by mouth 2 (two) times daily as needed for headache.   Yes [provider]  acidophilus (RISAQUAD) CAPS capsule Take 2 capsules by mouth daily. Patient taking differently: Take 1 capsule by mouth every evening. 11/22/24  Yes Vann, Jessica U, DO  B Complex-C-Folic Acid  (RENA-VITE PO) Take 1 tablet by mouth every morning.   Yes [provider]  buPROPion  (WELLBUTRIN  XL) 300 MG 24 hr tablet Take 1 tablet (300 mg total) by mouth in the morning. 10/19/24  Yes Plotnikov, Karlynn GAILS, MD  carboxymethylcellulose (REFRESH PLUS) 0.5 % SOLN Place 1 drop into both eyes 3 (three) times daily as needed (dry eyes/irritation).   Yes [provider]  cinacalcet  (SENSIPAR ) 30 MG tablet Take 30 mg by mouth Every Tuesday,Thursday,and Saturday with dialysis.   Yes [provider]  cyclobenzaprine  (FLEXERIL ) 10 MG tablet Take 1 tablet (10 mg total) by mouth 3 (three) times daily as needed for muscle spasms. 09/12/24  Yes Plotnikov, Karlynn GAILS, MD  diphenhydramine -acetaminophen  (TYLENOL  PM) 25-500 MG TABS tablet Take 2 tablets by mouth 3 (three) times a week. At bedtime Patient taking differently: Take 2 tablets by mouth at bedtime as needed (pain and sleep).   Yes [provider]  docusate sodium  (COLACE) 50 MG capsule Take 50-100 mg by mouth 2 (two) times daily. 1 qam, 2 qpm   Yes [provider]  FLUoxetine  (PROZAC ) 40 MG capsule Take 1 capsule (40 mg total) by mouth in the morning. 08/26/23  Yes   fluticasone  (FLONASE ) 50 MCG/ACT nasal spray Place 2 sprays into both nostrils daily. Patient taking differently: Place 2 sprays into both nostrils as needed for allergies. 11/30/23  Yes Soldatova, Liuba, MD  glimepiride  (AMARYL ) 1 MG tablet Take 1 tablet (1 mg total) by mouth daily with breakfast. 10/19/24  Yes Plotnikov, Karlynn GAILS, MD  ketoconazole  (NIZORAL ) 2 % cream Apply 1 Application topically daily. Patient  taking differently: Apply 1 Application topically as needed for irritation. 04/04/23  Yes Plotnikov, Aleksei V, MD  lanthanum (FOSRENOL) 1000 MG chewable tablet Chew 1,000-2,000 mg by mouth See admin instructions. Taking 2000 mg with meals and 1000 mg  with snacks   Yes [provider]  lidocaine  (LIDODERM ) 5 % Place 1 patch onto the skin as needed. Remove & Discard patch within 12 hours or as directed by MD   Yes [provider]  LORazepam  (ATIVAN ) 2 MG tablet Take 1 tablet (2 mg total) by mouth 2 (two) times daily as needed for anxiety. 03/14/24  Yes   midodrine  (PROAMATINE ) 10 MG tablet Take 1 tablet (10 mg total) by mouth in the morning, at noon, and at bedtime. 11/21/24  Yes Vann, Jessica U, DO  Multiple Vitamins-Minerals (PRESERVISION AREDS 2) CAPS Take 1 capsule by mouth in the morning and at bedtime.   Yes [provider]  naloxone  (NARCAN ) nasal spray 4 mg/0.1 mL Take by nasal route every 3 minutes until patient awakes or EMS arrives. 01/20/22  Yes   oxyCODONE  (ROXICODONE ) 15 MG immediate release tablet Take 1 tablet (15 mg total) by mouth 4 (four) times daily as needed. 02/22/24  Yes   pantoprazole  (PROTONIX ) 40 MG tablet TAKE 1 TABLET BY MOUTH TWICE  DAILY 09/28/24  Yes Abran Norleen SAILOR, MD  pilocarpine  (SALAGEN ) 5 MG tablet TAKE 1 TABLET BY MOUTH TWICE  DAILY 05/24/24  Yes Plotnikov, Aleksei V, MD  pioglitazone  (ACTOS ) 15 MG tablet Take 1 tablet (15 mg total) by mouth daily. 10/19/24  Yes Plotnikov, Aleksei V, MD  polyethylene glycol (MIRALAX  / GLYCOLAX ) 17 g packet Take 17 g by mouth daily as needed for moderate constipation.   Yes [provider]  PREVIDENT 5000 DRY MOUTH 1.1 % GEL dental gel Place 1 application  onto teeth in the morning and at bedtime. 10/25/21  Yes [provider]  REPATHA  SURECLICK 140 MG/ML SOAJ INJECT 1 PEN SUBCUTANEOUSLY  EVERY 2 WEEKS 10/24/24  Yes Croitoru, Mihai, MD  Accu-Chek Softclix Lancets lancets Use 1 lancet up to 4 times  daily as directed  03/12/22   Samtani, Jai-Gurmukh, MD  [Paused] Semaglutide ,0.25 or 0.5MG /DOS, (OZEMPIC , 0.25 OR 0.5 MG/DOSE,) 2 MG/3ML SOPN INJECT SUBCUTANEOUSLY 0.5 MG  EVERY WEEK Patient not taking: No sig reported Wait to take this until your doctor or other care provider tells you to start again. 11/04/24   Plotnikov, Karlynn GAILS, MD     Vitals:   11/30/24 0300 11/30/24 0555 11/30/24 0651 11/30/24 0730  BP: (!) 100/47 100/67  (!) 97/46  Pulse: 71 77  74  Resp: 12 13  11   Temp: 98 F (36.7 C)  98.7 F (37.1 C)   TempSrc:      SpO2: 95% 98%  91%  Weight:      Height:       Exam Gen alert, no distress Sclera anicteric, throat clear, dry mouth which is chronic No jvd or bruits Chest clear bilat to bases RRR no MRG Abd soft ntnd no mass or ascites +bs Ext no LE or UE edema, no other edema Neuro is alert, Ox 3 , nf      Home bp meds: Midodrine  10 tid    OP HD: [facility] TTS  From 12/25 --> B400  82.5kg   AVF  Heparin  ?   Assessment/ Plan ESRD: on HD TTS. Last HD Tuesday. Labs/ vol okay, next HD tomorrow.  BP: a bit labile in the ED. Midodrine  10mg  tid continued here as at home.  Volume: no sig vol excess on exam. Small UF w/ HD.  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 Follow.  DNR- limited    Anita Fret  MD [facility] 11/30/2024, 8:43 AM  Recent Labs  Lab 11/29/24 1432 11/29/24 2200 11/30/24 0403  HGB 8.8* 7.6* 7.4*  ALBUMIN  4.2  --   --   CALCIUM 8.7*  --  8.3*  CREATININE 2.20*  --  3.11*  K 3.7  --  3.5   Inpatient medications:  buPROPion   300 mg Oral q AM   [START ON 12/01/2024] cinacalcet   30 mg Oral Q T,Th,Sa-HD   FLUoxetine   40 mg Oral q AM   insulin  aspart  0-9 Units Subcutaneous Q4H   midodrine   10 mg Oral TID   pantoprazole   40 mg Oral BID    acetaminophen  **OR** acetaminophen , cyclobenzaprine , LORazepam , ondansetron  **OR** ondansetron  (ZOFRAN ) IV, oxyCODONE , polyethylene glycol, senna-docusate      [1]  Allergies Allergen  Reactions   Erythromycin  Diarrhea and Nausea And Vomiting   Gabapentin Other (See Comments)    Confusion and falling  Hallucinations   Lyrica [Pregabalin] Other (See Comments)    Confusion and falling Hallucinations    Other Itching and Other (See Comments)    Patient stated it was a soap she used while in the hospital that you don't have to wash off with the combination of Diclofenac  Sodium Gel. Caused Itching and burning.

## 2024-12-01 ENCOUNTER — Inpatient Hospital Stay (HOSPITAL_COMMUNITY): Admitting: Anesthesiology

## 2024-12-01 ENCOUNTER — Encounter (HOSPITAL_COMMUNITY): Payer: Self-pay | Admitting: Internal Medicine

## 2024-12-01 ENCOUNTER — Encounter (HOSPITAL_COMMUNITY): Admission: EM | Disposition: A | Payer: Self-pay | Source: Home / Self Care

## 2024-12-01 DIAGNOSIS — I10 Essential (primary) hypertension: Secondary | ICD-10-CM | POA: Diagnosis not present

## 2024-12-01 DIAGNOSIS — K529 Noninfective gastroenteritis and colitis, unspecified: Secondary | ICD-10-CM | POA: Diagnosis not present

## 2024-12-01 DIAGNOSIS — F418 Other specified anxiety disorders: Secondary | ICD-10-CM | POA: Diagnosis not present

## 2024-12-01 DIAGNOSIS — K625 Hemorrhage of anus and rectum: Secondary | ICD-10-CM | POA: Diagnosis not present

## 2024-12-01 DIAGNOSIS — K633 Ulcer of intestine: Secondary | ICD-10-CM

## 2024-12-01 DIAGNOSIS — Z87891 Personal history of nicotine dependence: Secondary | ICD-10-CM | POA: Diagnosis not present

## 2024-12-01 DIAGNOSIS — E119 Type 2 diabetes mellitus without complications: Secondary | ICD-10-CM | POA: Diagnosis not present

## 2024-12-01 HISTORY — PX: COLONOSCOPY: SHX5424

## 2024-12-01 HISTORY — PX: SCLEROTHERAPY: SHX6841

## 2024-12-01 HISTORY — PX: HEMOSTASIS CLIP PLACEMENT: SHX6857

## 2024-12-01 LAB — GLUCOSE, CAPILLARY
Glucose-Capillary: 107 mg/dL — ABNORMAL HIGH (ref 70–99)
Glucose-Capillary: 125 mg/dL — ABNORMAL HIGH (ref 70–99)
Glucose-Capillary: 274 mg/dL — ABNORMAL HIGH (ref 70–99)
Glucose-Capillary: 71 mg/dL (ref 70–99)
Glucose-Capillary: 73 mg/dL (ref 70–99)
Glucose-Capillary: 81 mg/dL (ref 70–99)
Glucose-Capillary: 92 mg/dL (ref 70–99)

## 2024-12-01 LAB — BPAM RBC
Blood Product Expiration Date: 202601162359
ISSUE DATE / TIME: 202512191535
Unit Type and Rh: 5100

## 2024-12-01 LAB — RENAL FUNCTION PANEL
Albumin: 3.5 g/dL (ref 3.5–5.0)
Anion gap: 16 — ABNORMAL HIGH (ref 5–15)
BUN: 24 mg/dL — ABNORMAL HIGH (ref 8–23)
CO2: 26 mmol/L (ref 22–32)
Calcium: 8.5 mg/dL — ABNORMAL LOW (ref 8.9–10.3)
Chloride: 86 mmol/L — ABNORMAL LOW (ref 98–111)
Creatinine, Ser: 4.6 mg/dL — ABNORMAL HIGH (ref 0.44–1.00)
GFR, Estimated: 10 mL/min — ABNORMAL LOW
Glucose, Bld: 97 mg/dL (ref 70–99)
Phosphorus: 5.4 mg/dL — ABNORMAL HIGH (ref 2.5–4.6)
Potassium: 4.4 mmol/L (ref 3.5–5.1)
Sodium: 128 mmol/L — ABNORMAL LOW (ref 135–145)

## 2024-12-01 LAB — HEMOGLOBIN AND HEMATOCRIT, BLOOD
HCT: 25.6 % — ABNORMAL LOW (ref 36.0–46.0)
HCT: 26.7 % — ABNORMAL LOW (ref 36.0–46.0)
HCT: 26.7 % — ABNORMAL LOW (ref 36.0–46.0)
Hemoglobin: 8.4 g/dL — ABNORMAL LOW (ref 12.0–15.0)
Hemoglobin: 8.4 g/dL — ABNORMAL LOW (ref 12.0–15.0)
Hemoglobin: 8.6 g/dL — ABNORMAL LOW (ref 12.0–15.0)

## 2024-12-01 LAB — TYPE AND SCREEN
ABO/RH(D): O POS
Antibody Screen: NEGATIVE
Unit division: 0

## 2024-12-01 MED ORDER — OXYCODONE HCL 5 MG PO TABS: 5.0000 mg | ORAL_TABLET | Freq: Once | ORAL | Status: DC | PRN

## 2024-12-01 MED ORDER — ALBUMIN HUMAN 5 % IV SOLN: INTRAVENOUS | Status: DC | PRN

## 2024-12-01 MED ORDER — LIDOCAINE 5 % EX PTCH
1.0000 | MEDICATED_PATCH | CUTANEOUS | Status: DC
  Administered 2024-12-01 – 2024-12-03 (×3): 1 via TRANSDERMAL
  Filled 2024-12-01 (×3): qty 1

## 2024-12-01 MED ORDER — SODIUM CHLORIDE (PF) 0.9 % IJ SOLN
PREFILLED_SYRINGE | INTRAVENOUS | Status: DC | PRN
  Administered 2024-12-01: 2 mL

## 2024-12-01 MED ORDER — PROPOFOL 10 MG/ML IV BOLUS
INTRAVENOUS | Status: DC | PRN
  Administered 2024-12-01: 30 mg via INTRAVENOUS
  Administered 2024-12-01: 20 mg via INTRAVENOUS

## 2024-12-01 MED ORDER — ALTEPLASE 2 MG IJ SOLR: 2.0000 mg | Freq: Once | INTRAMUSCULAR | Status: DC | PRN

## 2024-12-01 MED ORDER — LIDOCAINE-PRILOCAINE 2.5-2.5 % EX CREA: 1.0000 | TOPICAL_CREAM | CUTANEOUS | Status: DC | PRN

## 2024-12-01 MED ORDER — ACETAMINOPHEN 10 MG/ML IV SOLN: 1000.0000 mg | Freq: Once | INTRAVENOUS | Status: DC | PRN

## 2024-12-01 MED ORDER — EPINEPHRINE 1 MG/10ML IV SOSY
PREFILLED_SYRINGE | INTRAVENOUS | Status: AC
  Filled 2024-12-01: qty 10

## 2024-12-01 MED ORDER — HEPARIN SODIUM (PORCINE) 1000 UNIT/ML DIALYSIS: 1000.0000 [IU] | INTRAMUSCULAR | Status: DC | PRN

## 2024-12-01 MED ORDER — NEPRO/CARBSTEADY PO LIQD: 237.0000 mL | ORAL | Status: DC | PRN

## 2024-12-01 MED ORDER — PENTAFLUOROPROP-TETRAFLUOROETH EX AERO: 1.0000 | INHALATION_SPRAY | CUTANEOUS | Status: DC | PRN

## 2024-12-01 MED ORDER — FENTANYL CITRATE (PF) 100 MCG/2ML IJ SOLN: 25.0000 ug | INTRAMUSCULAR | Status: DC | PRN

## 2024-12-01 MED ORDER — PHENYLEPHRINE 80 MCG/ML (10ML) SYRINGE FOR IV PUSH (FOR BLOOD PRESSURE SUPPORT)
PREFILLED_SYRINGE | INTRAVENOUS | Status: DC | PRN
  Administered 2024-12-01 (×2): 160 ug via INTRAVENOUS
  Administered 2024-12-01: 200 ug via INTRAVENOUS
  Administered 2024-12-01 (×3): 160 ug via INTRAVENOUS
  Administered 2024-12-01: 200 ug via INTRAVENOUS
  Administered 2024-12-01: 160 ug via INTRAVENOUS
  Administered 2024-12-01: 200 ug via INTRAVENOUS

## 2024-12-01 MED ORDER — LIDOCAINE HCL (PF) 1 % IJ SOLN: 5.0000 mL | INTRAMUSCULAR | Status: DC | PRN

## 2024-12-01 MED ORDER — PROPOFOL 500 MG/50ML IV EMUL
INTRAVENOUS | Status: DC | PRN
  Administered 2024-12-01: 115 ug/kg/min via INTRAVENOUS

## 2024-12-01 MED ORDER — ANTICOAGULANT SODIUM CITRATE 4% (200MG/5ML) IV SOLN: 5.0000 mL | Status: DC | PRN

## 2024-12-01 MED ORDER — EPHEDRINE SULFATE-NACL 50-0.9 MG/10ML-% IV SOSY
PREFILLED_SYRINGE | INTRAVENOUS | Status: DC | PRN
  Administered 2024-12-01: 10 mg via INTRAVENOUS
  Administered 2024-12-01: 5 mg via INTRAVENOUS
  Administered 2024-12-01: 10 mg via INTRAVENOUS

## 2024-12-01 MED ORDER — ONDANSETRON HCL 4 MG/2ML IJ SOLN: 4.0000 mg | Freq: Once | INTRAMUSCULAR | Status: DC | PRN

## 2024-12-01 MED ORDER — OXYCODONE HCL 5 MG/5ML PO SOLN: 5.0000 mg | Freq: Once | ORAL | Status: DC | PRN

## 2024-12-01 MED ORDER — LIDOCAINE 2% (20 MG/ML) 5 ML SYRINGE
INTRAMUSCULAR | Status: DC | PRN
  Administered 2024-12-01: 80 mg via INTRAVENOUS

## 2024-12-01 NOTE — Progress Notes (Signed)
" °   12/01/24 1650  Vitals  Temp 98.2 F (36.8 C)  Pulse Rate 74  Resp 15  BP (!) 98/43  SpO2 94 %  O2 Device Room Air  Weight 83.8 kg  Type of Weight Post-Dialysis  Oxygen Therapy  Patient Activity (if Appropriate) In bed  Pulse Oximetry Type Continuous  Oximetry Probe Site Changed No  Post Treatment  Dialyzer Clearance Lightly streaked  Hemodialysis Intake (mL) 0 mL  Liters Processed 84  Fluid Removed (mL) 700 mL (nephrology is aware)  Tolerated HD Treatment Yes  AVG/AVF Arterial Site Held (minutes) 10 minutes  AVG/AVF Venous Site Held (minutes) 10 minutes   Received patient in bed to unit.  Alert and oriented.  Informed consent signed and in chart.   TX duration:3.5  Patient tolerated well.  Transported back to the room  Alert, without acute distress.  Hand-off given to patient's nurse.   Access used: RUAF Access issues: no complications  Total UF removed: Medication(s) given: none   [name] "

## 2024-12-01 NOTE — Progress Notes (Signed)
 [facility] Progress Note  Subjective:  Seen in HD unit GI doctor who did colonoscopy was asking about sevelamer  colonic lesions and has she been taking sevelamer  as a binder recently? 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  No other issues today  Presentation summary: 73 y.o. year-old w/ PMH as below who presented to ED yesterday afternoon 11/29/2024 complaining of rectal bleeding.  She was recently discharged on Monday 4 days ago for a similar problem.  In the ED BP was 110-140/45-55, HR 80, RR 14, temp 98.  98 % O2 sat on room air.  Labs showed K+ 3.5, BUN 17 and creatinine 3.1.  Hgb 7.4, WBC 8K.  FOBT was positive. CTA of abd/ pelvis showed persistent narrowing involving the cecum which may represent focal inflammation or possibly malignancy.  Patient received midodrine , p.o. Protonix , and insulin .  Patient was admitted to the hospitalist service.  They are asked to see for dialysis Pt seen in ED.  Patient's complaints are similar to above, main issues are rectal bleeding.  She does not know if she has Crohn's disease or any inflammatory bowel disease, however she said it does run in the family.  Denies any shortness of breath or leg swelling.  Her weight has been going up and down she says, her dry weight is related to dialysis.  Her last dialysis was Tuesday.  Vitals:   12/01/24 1129 12/01/24 1154 12/01/24 1310 12/01/24 1330  BP: (!) 113/46 (!) 115/52 (!) 99/42 (!) 107/47  Pulse: 69 70 71 68  Resp: 14 16 17 13   Temp: 97.8 F (36.6 C) 98.7 F (37.1 C)    TempSrc:  Oral    SpO2: 100% 98% 98% 96%  Weight:  84.5 kg    Height:        Exam: Gen alert, no distress dry mouth which is chronic No jvd Chest clear bilat to bases RRR no MRG Abd soft ntnd no mass or ascites +bs Ext no LE edema Neuro is alert, Ox 3 , nf       Home bp meds: Midodrine  10 tid     OP HD: [facility] TTS 4h  82.8kg  B400   AVF   Heparin   none  Low bp's, on midodrine    Assessment/ Plan ESRD: on HD TTS. Last HD Tuesday. HD today.   BP: a bit labile in the ED. Midodrine  10mg  tid continued here as at home.  Volume: no sig vol excess on exam. Small UF w/ HD today.   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 Follow.  Recurrent lower GIB: sp colonoscopy by GI.  Multiple ulcers were found in the ascending colon, some spurting blood and 3 were treated with hemostatic clips and epinephrine  injection.  There was also an ischemic appearing ulceration with adherent clot.  See GI notes. DNR- limited       Myer Fret MD  [facility] 12/01/2024, 1:51 PM  Recent Labs  Lab 11/29/24 1432 11/29/24 2200 11/30/24 0403 11/30/24 1237 12/01/24 0625 12/01/24 1157  HGB 8.8*   < > 7.4*   < > 8.4* 8.4*  ALBUMIN  4.2  --   --   --   --  3.5  CALCIUM 8.7*  --  8.3*  --   --  8.5*  PHOS  --   --   --   --   --  5.4*  CREATININE 2.20*  --  3.11*  --   --  4.60*  K 3.7  --  3.5  --   --  4.4   < > = values in this interval not displayed.   No results for input(s): IRON, TIBC, FERRITIN in the last 168 hours. Inpatient medications:  buPROPion   300 mg Oral q AM   Chlorhexidine  Gluconate Cloth  6 each Topical Q0600   cinacalcet   30 mg Oral Q T,Th,Sa-HD   FLUoxetine   40 mg Oral q AM   insulin  aspart  0-9 Units Subcutaneous Q4H   lidocaine   1 patch Transdermal Q24H   midodrine   10 mg Oral TID   pantoprazole   40 mg Oral BID   [COMPLETED] polyethylene glycol powder  119 g Oral Once    anticoagulant sodium citrate      acetaminophen  **OR** acetaminophen , alteplase , anticoagulant sodium citrate , cyclobenzaprine , feeding supplement (NEPRO CARB STEADY), heparin , lidocaine  (PF), lidocaine -prilocaine , LORazepam , ondansetron  **OR** ondansetron  (ZOFRAN ) IV, oxyCODONE , pentafluoroprop-tetrafluoroeth, polyethylene glycol, senna-docusate

## 2024-12-01 NOTE — Op Note (Signed)
 [facility] Patient Name: [name] Procedure Date : 12/01/2024 MRN: [id number] Attending MD: Belvie Just , MD, [id number] Date of Birth: 1951/06/28 CSN: [id number] Age: 73 Admit Type: Inpatient Procedure:                Colonoscopy Indications:              Hematochezia, Abnormal CT of the GI tract Providers:                Belvie Just, MD, Collene Edu, RN, Haskel Chris,                            Technician Referring MD:              Medicines:                Propofol  per Anesthesia Complications:            No immediate complications. Estimated Blood Loss:     Estimated blood loss: none. Procedure:                Pre-Anesthesia Assessment:                           - Prior to the procedure, a History and Physical                            was performed, and patient medications and                            allergies were reviewed. The patient's tolerance of                            previous anesthesia was also reviewed. The risks                            and benefits of the procedure and the sedation                            options and risks were discussed with the patient.                            All questions were answered, and informed consent                            was obtained. Prior Anticoagulants: The patient has                            taken no anticoagulant or antiplatelet agents. ASA                            Grade Assessment: III - A patient with severe                            systemic disease. After reviewing the risks and  benefits, the patient was deemed in satisfactory                            condition to undergo the procedure.                           - Sedation was administered by an anesthesia                            professional. Deep sedation was attained.                           After obtaining informed consent, the colonoscope                            was passed under direct  vision. Throughout the                            procedure, the patient's blood pressure, pulse, and                            oxygen saturations were monitored continuously. The                            CF-HQ190L ([id number]) Olympus colonoscope was                            introduced through the anus and advanced to the the                            cecum, identified by appendiceal orifice and                            ileocecal valve. The colonoscopy was performed with                            difficulty. The patient tolerated the procedure                            well. The quality of the bowel preparation was                            adequate. The ileocecal valve, appendiceal orifice,                            and rectum were photographed. Scope In: 9:33:36 AM Scope Out: 10:28:39 AM Scope Withdrawal Time: 0 hours 34 minutes 17 seconds  Total Procedure Duration: 0 hours 55 minutes 3 seconds  Findings:      Multiple ulcers were found in the proximal ascending colon. Spurting       bleeding was present. Stigmata of recent bleeding were present. To stop       active bleeding, three hemostatic clips were successfully placed (MR       safe). Clip manufacturer: [employer]. There was no bleeding at  the end of the procedure. Area was successfully injected with 2 mL of a       0.1 mg/mL solution of epinephrine  for hemostasis. To stop active       bleeding, hemostatic spray was deployed. A single spray was applied. The       NexPowder clogged and failed to deploy. PuraStat was used.      Scattered large-mouthed, medium-mouthed and small-mouthed diverticula       were found in the sigmoid colon.      Traversing the colon to the cecum was difficult as there was significant       looping and torturosity. Also, a significant amount of fresh blood was       noted. In the ascending colon, just proximal to the cecal cap was a 75%       circumfirential superficial ischemic  appearing ulceration. There was an       adherent clot. Clearing the clot resulted in a moderate arterial       spurting. Visualization deteriorated rapidly with the bleeding. Quickly       epinephrine  was injected near the site of the spurting and after a total       of 2 ml of 1:10,000 Epi injection the bleeding arrested. A large fresh       clot also formed around the area. Clearance of the fresh clot did not       allow for a clear identification of the actual vessel. There was still       some adherent cold clot. With the improved visualization a total of 3       Mantis Clips were deployed. Two were successful. It was not clear if the       clips were truly on the visible vessel and deployment was difficult with       the angulation. An attempt with Nexpowder was performed, but the tubing       clogged. Clearance of the clogged portion did not resolve the situation.       PuraStat was then injectd along the entire ulcer base.      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 Renal is checking when she was last on the medication. Impression:               - Multiple ulcers in the proximal ascending colon.                            Clip manufacturer: [employer]. Clips (MR                            safe) were placed. Injected. hemostatic spray                            applied.                           - Diverticulosis in the sigmoid colon.                           - No specimens collected. Recommendation:           - Return patient to hospital ward for ongoing care.                           -  Resume regular diet.                           - Follow HGB and transfuse as necessary.                           - If bleeding recurs, then she will need evaluation                            by IR.                           - Avoid Sevelamar binder. Procedure Code(s):        --- Professional ---                           [phone],  Colonoscopy, flexible; with control of                            bleeding, any method Diagnosis Code(s):        --- Professional ---                           K63.3, Ulcer of intestine                           K92.1, Melena (includes Hematochezia)                           K57.30, Diverticulosis of large intestine without                            perforation or abscess without bleeding                           R93.3, Abnormal findings on diagnostic imaging of                            other parts of digestive tract CPT copyright 2022 [employer]. All rights reserved. The codes documented in this report are preliminary and upon coder review may  be revised to meet current compliance requirements. Belvie Just, MD Belvie Just, MD 12/01/2024 10:57:07 AM This report has been signed electronically. Number of Addenda: 0

## 2024-12-01 NOTE — Progress Notes (Signed)
 Pt back to the unit from dialysis. Pt alert and verbally responsive. Telemetry reverified with CCMD. Pt in bed with call light within reach. Bed alarm on and call light within reach. MYRTIS Gong Pradeep Beaubrun RN.   12/01/24 1724  Vitals  Temp 98 F (36.7 C)  Temp Source Oral  BP (!) 101/50  MAP (mmHg) 66  BP Location Left Arm  BP Method Automatic  Patient Position (if appropriate) Lying  Pulse Rate 75  Pulse Rate Source Monitor  ECG Heart Rate 75  Resp 16  Level of Consciousness  Level of Consciousness Alert  MEWS COLOR  MEWS Score Color Green  Oxygen Therapy  SpO2 98 %  O2 Device Room Air  Pain Assessment  Pain Scale 0-10  Pain Score 7  Pain Type Chronic pain  Pain Location Shoulder  Pain Orientation Left  MEWS Score  MEWS Temp 0  MEWS Systolic 0  MEWS Pulse 0  MEWS RR 0  MEWS LOC 0  MEWS Score 0

## 2024-12-01 NOTE — Progress Notes (Signed)
 " PROGRESS NOTE    [name]  FMW:[id number] DOB: 06-12-1951 DOA: 11/29/2024 PCP: Garald Karlynn GAILS, MD    Brief Narrative:  [name] is a 73 y.o. female with medical history significant for ESRD on TTS HD, T2DM, chronic hypotension, anemia of chronic disease, depression/anxiety, chronic pain, and history of recurrent ischemic colitis who is admitted with recurrent rectal bleeding in setting of persistent colitis.   Assessment and Plan:  Recurrent rectal bleeding in setting of persistent colitis Patient with 2 recent admissions for the same, both times managed conservatively with antibiotics.  She developed recurrent rectal bleeding the day prior to this admission with another episode after dialysis on the day of admission.  Repeat CTA A/P showed persistent moderate narrowing involving the cecum.  - Colonoscopy (12/20) showed proximal ascending colon ischemic appearing ulceration with adherent clot and active arterial bleeding status post epi injection and clips with hemostasis, noted sigmoid diverticulosis - GI following, recommended monitoring Hgb and evaluation by IR if bleeding recurs  Blood loss anemia Anemia of chronic disease - Hemoglobin 8.8 on admission, down to nadir of 7.2 - Status post transfusion of 1 unit pRBC (12/19) with improvement to 8.9 - Hgb stable - Per GI, transfuse to maintain Hgb > 7.5-8  Chronic hypotension - Continue midodrine  10 mg 3 times daily   ESRD on TTS HD - Nephrology consulted for routine HD - Underwent routine HD today - Continue cinacalcet  on TTS   Type 2 diabetes - Holding home meds - Continue SSI   Depression/anxiety - Continue bupropion , fluoxetine , Ativan  as needed.   Chronic pain - Continue home Oxy IR 15 mg 4 times daily as needed.   DVT prophylaxis: SCDs Start: 11/29/24 1923   Code Status:   Code Status: Do not attempt resuscitation (DNR) PRE-ARREST INTERVENTIONS DESIRED  Family Communication: None  Disposition  Plan: Home pending clinical improvement PT -   OT -     Level of care: Progressive  Consultants:  GI, nephrology  Procedures:  None  Antimicrobials: None   Subjective: Examined during dialysis after colonoscopy.  Patient appears to be in good spirits given colonoscopy findings and hopeful for resolution of GI bleeds given identification of source.  No other complaints.  Objective: Vitals:   12/01/24 1645 12/01/24 1650 12/01/24 1724 12/01/24 2004  BP: (!) 92/47 (!) 98/43 (!) 101/50 (!) 110/57  Pulse: 72 74 75 78  Resp: 17 15 16 16   Temp:  98.2 F (36.8 C) 98 F (36.7 C) 99.2 F (37.3 C)  TempSrc:   Oral Oral  SpO2: 96% 94% 98% 98%  Weight:  83.8 kg    Height:        Intake/Output Summary (Last 24 hours) at 12/01/2024 2037 Last data filed at 12/01/2024 1800 Gross per 24 hour  Intake 840 ml  Output 700 ml  Net 140 ml   Filed Weights   11/29/24 1421 12/01/24 1154 12/01/24 1650  Weight: 82.4 kg 84.5 kg 83.8 kg    Examination:   Gen: NAD, A&Ox3 HEENT: NCAT, EOMI Neck: Supple, no JVD CV: RRR, no murmurs Resp: normal WOB, CTAB, no w/r/r Abd: Soft, NTND, no guarding Ext: No LE edema Skin: Warm, dry, no rashes/lesions Neuro: No focal deficits Psych: Calm, cooperative, appropriate affect   Data Reviewed: I have personally reviewed following labs and imaging studies  CBC: Recent Labs  Lab 11/29/24 1432 11/29/24 2200 11/30/24 0403 11/30/24 1237 11/30/24 1808 11/30/24 2317 12/01/24 0625 12/01/24 1157 12/01/24 1827  WBC  13.5*  --  8.2  --   --   --   --   --   --   HGB 8.8*   < > 7.4*   < > 8.9* 9.4* 8.4* 8.4* 8.6*  HCT 28.9*   < > 24.4*   < > 27.7* 29.3* 25.6* 26.7* 26.7*  MCV 103.2*  --  103.0*  --   --   --   --   --   --   PLT 348  --  275  --   --   --   --   --   --    < > = values in this interval not displayed.   Basic Metabolic Panel: Recent Labs  Lab 11/29/24 1432 11/30/24 0403 12/01/24 1157  NA 137 136 128*  K 3.7 3.5 4.4  CL 91*  92* 86*  CO2 28 32 26  GLUCOSE 188* 93 97  BUN 11 17 24*  CREATININE 2.20* 3.11* 4.60*  CALCIUM 8.7* 8.3* 8.5*  PHOS  --   --  5.4*   GFR: Estimated Creatinine Clearance: 10.7 mL/min (A) (by C-G formula based on SCr of 4.6 mg/dL (H)). Liver Function Tests: Recent Labs  Lab 11/29/24 1432 12/01/24 1157  AST 19  --   ALT 12  --   ALKPHOS 128*  --   BILITOT 0.3  --   PROT 7.3  --   ALBUMIN  4.2 3.5   Recent Labs  Lab 11/29/24 1432  LIPASE 19   No results for input(s): AMMONIA in the last 168 hours. Coagulation Profile: No results for input(s): INR, PROTIME in the last 168 hours. Cardiac Enzymes: No results for input(s): CKTOTAL, CKMB, CKMBINDEX, TROPONINI in the last 168 hours. BNP (last 3 results) No results for input(s): PROBNP in the last 8760 hours. HbA1C: No results for input(s): HGBA1C in the last 72 hours. CBG: Recent Labs  Lab 12/01/24 0741 12/01/24 1041 12/01/24 1204 12/01/24 1724 12/01/24 2010  GLUCAP 125* 81 92 107* 274*   Lipid Profile: No results for input(s): CHOL, HDL, LDLCALC, TRIG, CHOLHDL, LDLDIRECT in the last 72 hours. Thyroid  Function Tests: No results for input(s): TSH, T4TOTAL, FREET4, T3FREE, THYROIDAB in the last 72 hours. Anemia Panel: No results for input(s): VITAMINB12, FOLATE, FERRITIN, TIBC, IRON, RETICCTPCT in the last 72 hours. Sepsis Labs: No results for input(s): PROCALCITON, LATICACIDVEN in the last 168 hours.  No results found for this or any previous visit (from the past 240 hours).   Radiology Studies: No results found.   Scheduled Meds:  buPROPion   300 mg Oral q AM   Chlorhexidine  Gluconate Cloth  6 each Topical Q0600   cinacalcet   30 mg Oral Q T,Th,Sa-HD   FLUoxetine   40 mg Oral q AM   insulin  aspart  0-9 Units Subcutaneous Q4H   lidocaine   1 patch Transdermal Q24H   midodrine   10 mg Oral TID   pantoprazole   40 mg Oral BID   [COMPLETED] polyethylene glycol  powder  119 g Oral Once   Continuous Infusions:   Unresulted Labs (From admission, onward)     Start     Ordered   11/30/24 1134  Hemoglobin and hematocrit, blood  Now then every 6 hours,   R (with TIMED occurrences)     Comments: Just start 6 hours after last hgb, and call MD for hgb less than 7.5 for transfusion orders    11/30/24 1134  LOS:  LOS: 1 day   Time Spent: 45 minutes  Malashia Kamaka Al-Sultani, MD [facility]  If 7PM-7AM, please contact night-coverage  12/01/2024, 8:37 PM      "

## 2024-12-01 NOTE — Progress Notes (Signed)
Pt off unit to dialysis. P. Amo Brayson Livesey RN 

## 2024-12-01 NOTE — Progress Notes (Signed)
 Pt back to the unit from Endo. A&O x4, MAE x4. Ambulated from stretcher to bed in room. Report given to dialysis RN. MYRTIS Val Roof RN   12/01/24 1154  Vitals  Temp 98.7 F (37.1 C)  Temp Source Oral  BP (!) 115/52  MAP (mmHg) 71  BP Location Left Arm  BP Method Automatic  Patient Position (if appropriate) Lying  Pulse Rate 70  Pulse Rate Source Monitor  ECG Heart Rate 69  Resp 16  Level of Consciousness  Level of Consciousness Alert  MEWS COLOR  MEWS Score Color Green  Oxygen Therapy  SpO2 98 %  O2 Device Room Air  Pain Assessment  Pain Scale 0-10  Pain Score 4  Pain Location Head  Pain Intervention(s) Medication (See eMAR);Repositioned;Emotional support;Rest  MEWS Score  MEWS Temp 0  MEWS Systolic 0  MEWS Pulse 0  MEWS RR 0  MEWS LOC 0  MEWS Score 0

## 2024-12-01 NOTE — Progress Notes (Signed)
Pt transported off unit to Endo for procedure. P. Amo Zahriah Roes RN 

## 2024-12-01 NOTE — Transfer of Care (Signed)
 Immediate Anesthesia Transfer of Care Note  Patient: [name]  Procedure(s) Performed: COLONOSCOPY SCLEROTHERAPY CONTROL OF HEMORRHAGE, GI TRACT, ENDOSCOPIC, BY CLIPPING OR OVERSEWING  Patient Location: PACU  Anesthesia Type:MAC  Level of Consciousness: awake, alert , and oriented  Airway & Oxygen Therapy: Patient Spontanous Breathing and Patient connected to nasal cannula oxygen  Post-op Assessment: Report given to RN and Post -op Vital signs reviewed and stable  Post vital signs: Reviewed and stable  Last Vitals:  Vitals Value Taken Time  BP 114/43 12/01/24 10:41  Temp    Pulse 71 12/01/24 10:44  Resp 21 12/01/24 10:44  SpO2 98 % 12/01/24 10:44  Vitals shown include unfiled device data.  Last Pain:  Vitals:   12/01/24 0844  TempSrc: Temporal  PainSc: 4          Complications: No notable events documented.

## 2024-12-01 NOTE — Interval H&P Note (Signed)
 History and Physical Interval Note:  12/01/2024 9:05 AM  [name]  has presented today for surgery, with the diagnosis of Recurrent lower GI bleeding, ischemic colitis, anemia.  The various methods of treatment have been discussed with the patient and family. After consideration of risks, benefits and other options for treatment, the patient has consented to  Procedures: COLONOSCOPY (N/A) as a surgical intervention.  The patient's history has been reviewed, patient examined, no change in status, stable for surgery.  I have reviewed the patient's chart and labs.  Questions were answered to the patient's satisfaction.     Daana Petrasek D

## 2024-12-01 NOTE — Anesthesia Preprocedure Evaluation (Signed)
"                                    Anesthesia Evaluation  Patient identified by MRN, date of birth, ID band Patient awake    Reviewed: Allergy & Precautions, NPO status , Patient's Chart, lab work & pertinent test results, reviewed documented beta blocker date and time   History of Anesthesia Complications Negative for: history of anesthetic complications  Airway Mallampati: II  TM Distance: >3 FB     Dental no notable dental hx.    Pulmonary shortness of breath and with exertion, pneumonia, former smoker   breath sounds clear to auscultation       Cardiovascular hypertension, (-) angina (-) CAD and (-) Past MI  Rhythm:Regular Rate:Normal     Neuro/Psych  Headaches, neg Seizures PSYCHIATRIC DISORDERS Anxiety Depression     Neuromuscular disease    GI/Hepatic ,GERD  ,,(+) neg Cirrhosis        Endo/Other  diabetes, Type 2    Renal/GU ESRF and DialysisRenal disease     Musculoskeletal  (+) Arthritis , Osteoarthritis,  Fibromyalgia -  Abdominal   Peds  Hematology  (+) Blood dyscrasia, anemia   Anesthesia Other Findings   Reproductive/Obstetrics                              Anesthesia Physical Anesthesia Plan  ASA: 3  Anesthesia Plan: MAC   Post-op Pain Management:    Induction: Intravenous  PONV Risk Score and Plan: 2 and Ondansetron  and Propofol  infusion  Airway Management Planned: Natural Airway and Simple Face Mask  Additional Equipment:   Intra-op Plan:   Post-operative Plan:   Informed Consent: I have reviewed the patients History and Physical, chart, labs and discussed the procedure including the risks, benefits and alternatives for the proposed anesthesia with the patient or authorized representative who has indicated his/her understanding and acceptance.     Dental advisory given  Plan Discussed with: CRNA  Anesthesia Plan Comments:         Anesthesia Quick Evaluation  "

## 2024-12-01 NOTE — Anesthesia Postprocedure Evaluation (Signed)
"   Anesthesia Post Note  Patient: [name]  Procedure(s) Performed: COLONOSCOPY SCLEROTHERAPY CONTROL OF HEMORRHAGE, GI TRACT, ENDOSCOPIC, BY CLIPPING OR OVERSEWING     Patient location during evaluation: PACU Anesthesia Type: MAC Level of consciousness: awake and alert Pain management: pain level controlled Vital Signs Assessment: post-procedure vital signs reviewed and stable Respiratory status: spontaneous breathing, nonlabored ventilation, respiratory function stable and patient connected to nasal cannula oxygen Cardiovascular status: stable and blood pressure returned to baseline Postop Assessment: no apparent nausea or vomiting Anesthetic complications: no   No notable events documented.  Last Vitals:  Vitals:   12/01/24 1154 12/01/24 1310  BP: (!) 115/52 (!) 99/42  Pulse: 70 71  Resp: 16 17  Temp: 37.1 C   SpO2: 98% 98%    Last Pain:  Vitals:   12/01/24 1239  TempSrc:   PainSc: Asleep                 Lynwood MARLA Cornea      "

## 2024-12-02 DIAGNOSIS — K625 Hemorrhage of anus and rectum: Secondary | ICD-10-CM | POA: Diagnosis not present

## 2024-12-02 DIAGNOSIS — K529 Noninfective gastroenteritis and colitis, unspecified: Secondary | ICD-10-CM | POA: Diagnosis not present

## 2024-12-02 LAB — HEPATIC FUNCTION PANEL
ALT: 20 U/L (ref 0–44)
AST: 54 U/L — ABNORMAL HIGH (ref 15–41)
Albumin: 3.1 g/dL — ABNORMAL LOW (ref 3.5–5.0)
Alkaline Phosphatase: 78 U/L (ref 38–126)
Bilirubin, Direct: 0.5 mg/dL — ABNORMAL HIGH (ref 0.0–0.2)
Indirect Bilirubin: 0.7 mg/dL (ref 0.3–0.9)
Total Bilirubin: 1.1 mg/dL (ref 0.0–1.2)
Total Protein: 5.7 g/dL — ABNORMAL LOW (ref 6.5–8.1)

## 2024-12-02 LAB — CBC
HCT: 25.3 % — ABNORMAL LOW (ref 36.0–46.0)
Hemoglobin: 7.9 g/dL — ABNORMAL LOW (ref 12.0–15.0)
MCH: 30.4 pg (ref 26.0–34.0)
MCHC: 31.2 g/dL (ref 30.0–36.0)
MCV: 97.3 fL (ref 80.0–100.0)
Platelets: 261 K/uL (ref 150–400)
RBC: 2.6 MIL/uL — ABNORMAL LOW (ref 3.87–5.11)
RDW: 15.5 % (ref 11.5–15.5)
WBC: 7 K/uL (ref 4.0–10.5)
nRBC: 0 % (ref 0.0–0.2)

## 2024-12-02 LAB — GLUCOSE, CAPILLARY
Glucose-Capillary: 101 mg/dL — ABNORMAL HIGH (ref 70–99)
Glucose-Capillary: 102 mg/dL — ABNORMAL HIGH (ref 70–99)
Glucose-Capillary: 120 mg/dL — ABNORMAL HIGH (ref 70–99)
Glucose-Capillary: 121 mg/dL — ABNORMAL HIGH (ref 70–99)
Glucose-Capillary: 127 mg/dL — ABNORMAL HIGH (ref 70–99)
Glucose-Capillary: 79 mg/dL (ref 70–99)
Glucose-Capillary: 92 mg/dL (ref 70–99)

## 2024-12-02 LAB — BASIC METABOLIC PANEL WITH GFR
Anion gap: 13 (ref 5–15)
BUN: 13 mg/dL (ref 8–23)
CO2: 28 mmol/L (ref 22–32)
Calcium: 8.1 mg/dL — ABNORMAL LOW (ref 8.9–10.3)
Chloride: 91 mmol/L — ABNORMAL LOW (ref 98–111)
Creatinine, Ser: 2.76 mg/dL — ABNORMAL HIGH (ref 0.44–1.00)
GFR, Estimated: 18 mL/min — ABNORMAL LOW
Glucose, Bld: 102 mg/dL — ABNORMAL HIGH (ref 70–99)
Potassium: 4.3 mmol/L (ref 3.5–5.1)
Sodium: 132 mmol/L — ABNORMAL LOW (ref 135–145)

## 2024-12-02 MED ORDER — ORAL CARE MOUTH RINSE: 15.0000 mL | OROMUCOSAL | Status: DC | PRN

## 2024-12-02 MED ORDER — CHLORHEXIDINE GLUCONATE CLOTH 2 % EX PADS: 6.0000 | MEDICATED_PAD | Freq: Every day | CUTANEOUS | Status: DC

## 2024-12-02 NOTE — Progress Notes (Signed)
 [facility] Progress Note  Subjective:  Seen in room Only got 0.7 L off w/ HD yesterday No rectal bleeding noted Denies swollen lanthanum binder pills whole, she chews/ grinds them well before swallowing   Presentation summary: 73 y.o. year-old w/ PMH as below who presented to ED yesterday afternoon 11/29/2024 complaining of rectal bleeding.  She was recently discharged on Monday 4 days ago for a similar problem.  In the ED BP was 110-140/45-55, HR 80, RR 14, temp 98.  98 % O2 sat on room air.  Labs showed K+ 3.5, BUN 17 and creatinine 3.1.  Hgb 7.4, WBC 8K.  FOBT was positive. CTA of abd/ pelvis showed persistent narrowing involving the cecum which may represent focal inflammation or possibly malignancy.  Patient received midodrine , p.o. Protonix , and insulin .  Patient was admitted to the hospitalist service.  They are asked to see for dialysis Pt seen in ED.  Patient's complaints are similar to above, main issues are rectal bleeding.  She does not know if she has Crohn's disease or any inflammatory bowel disease, however she said it does run in the family.  Denies any shortness of breath or leg swelling.  Her weight has been going up and down she says, her dry weight is related to dialysis.  Her last dialysis was Tuesday.  Vitals:   12/01/24 1650 12/01/24 1724 12/01/24 2004 12/02/24 0804  BP: (!) 98/43 (!) 101/50 (!) 110/57 (!) 121/53  Pulse: 74 75 78 71  Resp: 15 16 16 16   Temp: 98.2 F (36.8 C) 98 F (36.7 C) 99.2 F (37.3 C) 97.7 F (36.5 C)  TempSrc:  Oral Oral Oral  SpO2: 94% 98% 98% 100%  Weight: 83.8 kg     Height:        Exam: Gen alert, no distress dry mouth which is chronic No jvd Chest clear bilat to bases RRR no MRG Abd soft ntnd no mass or ascites +bs Ext no LE edema Neuro is alert, Ox 3 , nf       Home bp meds: Midodrine  10 tid     OP HD: [facility] TTS 4h  82.8kg  B400   AVF   Heparin  none  Low bp's, on midodrine    Assessment/ Plan ESRD: on HD  TTS. Had HD here Saturday. Next HD Monday (holiday schedule) if still here.   BP: bp's usually in the 100s-110s, occ 90's. This is chronic. Getting midodrine  10mg  tid here as at home.  Volume: no sig vol excess on exam, euvolemic on exam, on RA. Plan UF 1L w/ next HD.  Anemia of abl/ esrd: Hb 8- 9 here now. S/P 1u prbc's on 121/19. Hb improved from low 7s to mid 8s yest and 7.9 this am. Per pmd.   Recurrent lower GIB: sp colonoscopy by GI.  Multiple ulcers were found in the ascending colon, some were actively bleeding; 3 were treated with hemostatic clips and epinephrine  injection.  There was also an ischemic appearing ulceration with adherent clot.  See GI notes. DNR- limited       Myer Fret MD  [facility] 12/02/2024, 10:27 AM  Recent Labs  Lab 11/29/24 1432 11/29/24 2200 12/01/24 1157 12/01/24 1827 12/02/24 0547  HGB 8.8*   < > 8.4* 8.6* 7.9*  ALBUMIN  4.2  --  3.5  --   --   CALCIUM 8.7*   < > 8.5*  --  8.1*  PHOS  --   --  5.4*  --   --  CREATININE 2.20*   < > 4.60*  --  2.76*  K 3.7   < > 4.4  --  4.3   < > = values in this interval not displayed.   No results for input(s): IRON, TIBC, FERRITIN in the last 168 hours. Inpatient medications:  buPROPion   300 mg Oral q AM   Chlorhexidine  Gluconate Cloth  6 each Topical Q0600   cinacalcet   30 mg Oral Q T,Th,Sa-HD   FLUoxetine   40 mg Oral q AM   insulin  aspart  0-9 Units Subcutaneous Q4H   lidocaine   1 patch Transdermal Q24H   midodrine   10 mg Oral TID   pantoprazole   40 mg Oral BID     acetaminophen  **OR** acetaminophen , cyclobenzaprine , LORazepam , ondansetron  **OR** ondansetron  (ZOFRAN ) IV, oxyCODONE , polyethylene glycol, senna-docusate

## 2024-12-02 NOTE — Progress Notes (Signed)
 " PROGRESS NOTE    [name]  FMW:[id number] DOB: 02-Jan-1951 DOA: 11/29/2024 PCP: Garald Karlynn GAILS, MD    Brief Narrative:  [name] is a 73 y.o. female with medical history significant for ESRD on TTS HD, T2DM, chronic hypotension, anemia of chronic disease, depression/anxiety, chronic pain, and history of recurrent ischemic colitis who is admitted with recurrent rectal bleeding in setting of persistent colitis.   Assessment and Plan:  Recurrent rectal bleeding in setting of persistent colitis Patient with 2 recent admissions for the same, both times managed conservatively with antibiotics.  She developed recurrent rectal bleeding the day prior to this admission with another episode after dialysis on the day of admission.  Repeat CTA A/P showed persistent moderate narrowing involving the cecum.  - Colonoscopy (12/20) showed proximal ascending colon ischemic appearing ulceration with adherent clot and active arterial bleeding status post epi injection and clips with hemostasis, noted sigmoid diverticulosis - GI following, recommended continued monitoring Hgb and evaluation by IR if bleeding recurs  Blood loss anemia Anemia of chronic disease - Hemoglobin 8.8 on admission, down to nadir of 7.2 - Status post transfusion of 1 unit pRBC (12/19) with improvement to 8.9 - Hgb stable - Per GI, transfuse to maintain Hgb > 7.5-8  Chronic hypotension - Continue midodrine  10 mg 3 times daily   ESRD on TTS HD - Nephrology consulted for routine HD - Underwent routine HD today - Continue cinacalcet  on TTS   Type 2 diabetes - Holding home meds - Continue SSI   Depression/anxiety - Continue bupropion , fluoxetine , Ativan  as needed.   Chronic pain - Continue home Oxy IR 15 mg 4 times daily as needed.   DVT prophylaxis: SCDs Start: 11/29/24 1923   Code Status:   Code Status: Do not attempt resuscitation (DNR) PRE-ARREST INTERVENTIONS DESIRED  Family Communication:  None  Disposition Plan: Possibly home tomorrow if GI okay with that and Hgb is stable PT -   OT -     Level of care: Telemetry  Consultants:  GI, nephrology  Procedures:  None  Antimicrobials: None   Subjective: Patient seen at bedside. Patient's sister was at bedside. Feeling okay today, has some abdominal pain, has not had any bowel movements yet. Tolerating clear liquids.   Objective: Vitals:   12/02/24 0804 12/02/24 1140 12/02/24 1558 12/02/24 1944  BP: (!) 121/53 (!) 109/43 (!) 121/55 (!) 144/55  Pulse: 71 71 73 74  Resp: 16 (!) 9 16 16   Temp: 97.7 F (36.5 C) 98.6 F (37 C) 98.5 F (36.9 C) 98.5 F (36.9 C)  TempSrc: Oral Oral Oral Oral  SpO2: 100% 97% 94% 100%  Weight:      Height:       No intake or output data in the 24 hours ending 12/02/24 2017  Filed Weights   11/29/24 1421 12/01/24 1154 12/01/24 1650  Weight: 82.4 kg 84.5 kg 83.8 kg    Examination:   Gen: NAD, A&Ox3 HEENT: NCAT, EOMI Neck: Supple, no JVD CV: RRR, no murmurs Resp: normal WOB, CTAB, no w/r/r Abd: Soft, nondistended, mildly tender to palpation, no guarding Ext: No LE edema, RUE fistula Skin: Warm, dry, no rashes/lesions Neuro: No focal deficits Psych: Calm, cooperative, appropriate affect   Data Reviewed: I have personally reviewed following labs and imaging studies  CBC: Recent Labs  Lab 11/29/24 1432 11/29/24 2200 11/30/24 0403 11/30/24 1237 11/30/24 2317 12/01/24 0625 12/01/24 1157 12/01/24 1827 12/02/24 0547  WBC 13.5*  --  8.2  --   --   --   --   --  7.0  HGB 8.8*   < > 7.4*   < > 9.4* 8.4* 8.4* 8.6* 7.9*  HCT 28.9*   < > 24.4*   < > 29.3* 25.6* 26.7* 26.7* 25.3*  MCV 103.2*  --  103.0*  --   --   --   --   --  97.3  PLT 348  --  275  --   --   --   --   --  261   < > = values in this interval not displayed.   Basic Metabolic Panel: Recent Labs  Lab 11/29/24 1432 11/30/24 0403 12/01/24 1157 12/02/24 0547  NA 137 136 128* 132*  K 3.7 3.5 4.4 4.3   CL 91* 92* 86* 91*  CO2 28 32 26 28  GLUCOSE 188* 93 97 102*  BUN 11 17 24* 13  CREATININE 2.20* 3.11* 4.60* 2.76*  CALCIUM 8.7* 8.3* 8.5* 8.1*  PHOS  --   --  5.4*  --    GFR: Estimated Creatinine Clearance: 17.9 mL/min (A) (by C-G formula based on SCr of 2.76 mg/dL (H)). Liver Function Tests: Recent Labs  Lab 11/29/24 1432 12/01/24 1157 12/02/24 1454  AST 19  --  54*  ALT 12  --  20  ALKPHOS 128*  --  78  BILITOT 0.3  --  1.1  PROT 7.3  --  5.7*  ALBUMIN  4.2 3.5 3.1*   Recent Labs  Lab 11/29/24 1432  LIPASE 19   No results for input(s): AMMONIA in the last 168 hours. Coagulation Profile: No results for input(s): INR, PROTIME in the last 168 hours. Cardiac Enzymes: No results for input(s): CKTOTAL, CKMB, CKMBINDEX, TROPONINI in the last 168 hours. BNP (last 3 results) No results for input(s): PROBNP in the last 8760 hours. HbA1C: No results for input(s): HGBA1C in the last 72 hours. CBG: Recent Labs  Lab 12/02/24 0356 12/02/24 0811 12/02/24 1139 12/02/24 1548 12/02/24 1946  GLUCAP 92 102* 127* 101* 120*   Lipid Profile: No results for input(s): CHOL, HDL, LDLCALC, TRIG, CHOLHDL, LDLDIRECT in the last 72 hours. Thyroid  Function Tests: No results for input(s): TSH, T4TOTAL, FREET4, T3FREE, THYROIDAB in the last 72 hours. Anemia Panel: No results for input(s): VITAMINB12, FOLATE, FERRITIN, TIBC, IRON, RETICCTPCT in the last 72 hours. Sepsis Labs: No results for input(s): PROCALCITON, LATICACIDVEN in the last 168 hours.  No results found for this or any previous visit (from the past 240 hours).   Radiology Studies: No results found.   Scheduled Meds:  buPROPion   300 mg Oral q AM   [START ON 12/03/2024] Chlorhexidine  Gluconate Cloth  6 each Topical Q0600   cinacalcet   30 mg Oral Q T,Th,Sa-HD   FLUoxetine   40 mg Oral q AM   insulin  aspart  0-9 Units Subcutaneous Q4H   lidocaine   1 patch  Transdermal Q24H   midodrine   10 mg Oral TID   pantoprazole   40 mg Oral BID   Continuous Infusions:   Unresulted Labs (From admission, onward)     Start     Ordered   12/03/24 0500  CBC  Tomorrow morning,   R       Question:  Specimen collection method  Answer:  Lab=Lab collect   12/02/24 2052   12/03/24 0500  Basic metabolic panel with GFR  Tomorrow morning,   R       Question:  Specimen collection method  Answer:  Lab=Lab collect   12/02/24 2052  LOS:  LOS: 2 days   Time Spent: 35 minutes  Verlie Liotta Al-Sultani, MD [facility]  If 7PM-7AM, please contact night-coverage  12/02/2024, 8:17 PM      "

## 2024-12-02 NOTE — Progress Notes (Signed)
 Subjective: No acute events.  Objective: Vital signs in last 24 hours: Temp:  [97.7 F (36.5 C)-99.2 F (37.3 C)] 97.7 F (36.5 C) (12/21 0804) Pulse Rate:  [66-80] 71 (12/21 0804) Resp:  [11-24] 16 (12/21 0804) BP: (90-121)/(35-57) 121/53 (12/21 0804) SpO2:  [92 %-100 %] 100 % (12/21 0804) Weight:  [83.8 kg-84.5 kg] 83.8 kg (12/20 1650) Last BM Date : 12/01/24  Intake/Output from previous day: 12/20 0701 - 12/21 0700 In: 840 [P.O.:240; I.V.:350; IV Piggyback:250] Out: 700  Intake/Output this shift: No intake/output data recorded.  General appearance: sleeping soundly in REM sleep. GI: soft, non-tender; bowel sounds normal; no masses,  no organomegaly  Lab Results: Recent Labs    11/29/24 1432 11/29/24 2200 11/30/24 0403 11/30/24 1237 12/01/24 1157 12/01/24 1827 12/02/24 0547  WBC 13.5*  --  8.2  --   --   --  7.0  HGB 8.8*   < > 7.4*   < > 8.4* 8.6* 7.9*  HCT 28.9*   < > 24.4*   < > 26.7* 26.7* 25.3*  PLT 348  --  275  --   --   --  261   < > = values in this interval not displayed.   BMET Recent Labs    11/30/24 0403 12/01/24 1157 12/02/24 0547  NA 136 128* 132*  K 3.5 4.4 4.3  CL 92* 86* 91*  CO2 32 26 28  GLUCOSE 93 97 102*  BUN 17 24* 13  CREATININE 3.11* 4.60* 2.76*  CALCIUM 8.3* 8.5* 8.1*   LFT Recent Labs    11/29/24 1432 12/01/24 1157  PROT 7.3  --   ALBUMIN  4.2 3.5  AST 19  --   ALT 12  --   ALKPHOS 128*  --   BILITOT 0.3  --    PT/INR No results for input(s): LABPROT, INR in the last 72 hours. Hepatitis Panel No results for input(s): HEPBSAG, HCVAB, HEPAIGM, HEPBIGM in the last 72 hours. C-Diff No results for input(s): CDIFFTOX in the last 72 hours. Fecal Lactopherrin No results for input(s): FECLLACTOFRN in the last 72 hours.  Studies/Results: No results found.  Medications: Scheduled:  buPROPion   300 mg Oral q AM   Chlorhexidine  Gluconate Cloth  6 each Topical Q0600   cinacalcet   30 mg Oral Q T,Th,Sa-HD    FLUoxetine   40 mg Oral q AM   insulin  aspart  0-9 Units Subcutaneous Q4H   lidocaine   1 patch Transdermal Q24H   midodrine   10 mg Oral TID   pantoprazole   40 mg Oral BID   Continuous:  Assessment/Plan: 1) Right sided ischemic colitis with visible vessel s/p hemostatic treatment. 2) Anemia - stable. 3) ESRD on dialysis.   Her current colonic findings is consistent with an ischemic etiology associated with her ESRD.  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  Nursing did not report any hematochezia since the colonoscopy since yesterday.    Plan: 1) Continue to monitor HGB and transfuse as necessary. 2) If severe rebleeding recurs, she will need IR to intervene. 3) [facility] will assume care in the AM.  LOS: 2 days   Jamal Pavon D 12/02/2024, 8:48 AM

## 2024-12-03 ENCOUNTER — Encounter (HOSPITAL_COMMUNITY): Payer: Self-pay | Admitting: Gastroenterology

## 2024-12-03 DIAGNOSIS — K633 Ulcer of intestine: Secondary | ICD-10-CM

## 2024-12-03 DIAGNOSIS — R933 Abnormal findings on diagnostic imaging of other parts of digestive tract: Secondary | ICD-10-CM

## 2024-12-03 LAB — CBC
HCT: 26.9 % — ABNORMAL LOW (ref 36.0–46.0)
Hemoglobin: 8.6 g/dL — ABNORMAL LOW (ref 12.0–15.0)
MCH: 30.6 pg (ref 26.0–34.0)
MCHC: 32 g/dL (ref 30.0–36.0)
MCV: 95.7 fL (ref 80.0–100.0)
Platelets: 305 K/uL (ref 150–400)
RBC: 2.81 MIL/uL — ABNORMAL LOW (ref 3.87–5.11)
RDW: 14.9 % (ref 11.5–15.5)
WBC: 7.5 K/uL (ref 4.0–10.5)
nRBC: 0 % (ref 0.0–0.2)

## 2024-12-03 LAB — GLUCOSE, CAPILLARY
Glucose-Capillary: 45 mg/dL — ABNORMAL LOW (ref 70–99)
Glucose-Capillary: 46 mg/dL — ABNORMAL LOW (ref 70–99)
Glucose-Capillary: 71 mg/dL (ref 70–99)
Glucose-Capillary: 157 mg/dL — ABNORMAL HIGH (ref 70–99)
Glucose-Capillary: 134 mg/dL — ABNORMAL HIGH (ref 70–99)
Glucose-Capillary: 158 mg/dL — ABNORMAL HIGH (ref 70–99)

## 2024-12-03 LAB — BASIC METABOLIC PANEL WITH GFR
Anion gap: 15 (ref 5–15)
BUN: 21 mg/dL (ref 8–23)
CO2: 26 mmol/L (ref 22–32)
Calcium: 8.4 mg/dL — ABNORMAL LOW (ref 8.9–10.3)
Chloride: 90 mmol/L — ABNORMAL LOW (ref 98–111)
Creatinine, Ser: 4 mg/dL — ABNORMAL HIGH (ref 0.44–1.00)
GFR, Estimated: 11 mL/min — ABNORMAL LOW
Glucose, Bld: 62 mg/dL — ABNORMAL LOW (ref 70–99)
Potassium: 3.7 mmol/L (ref 3.5–5.1)
Sodium: 131 mmol/L — ABNORMAL LOW (ref 135–145)

## 2024-12-03 MED ORDER — NEPRO/CARBSTEADY PO LIQD: 237.0000 mL | ORAL | Status: DC | PRN

## 2024-12-03 MED ORDER — LIDOCAINE-PRILOCAINE 2.5-2.5 % EX CREA: 1.0000 | TOPICAL_CREAM | CUTANEOUS | Status: DC | PRN

## 2024-12-03 MED ORDER — HEPARIN SODIUM (PORCINE) 1000 UNIT/ML DIALYSIS: 1000.0000 [IU] | INTRAMUSCULAR | Status: DC | PRN

## 2024-12-03 MED ORDER — NEPRO/CARBSTEADY PO LIQD: 237.0000 mL | ORAL | Status: AC | PRN

## 2024-12-03 MED ORDER — PENTAFLUOROPROP-TETRAFLUOROETH EX AERO: 1.0000 | INHALATION_SPRAY | CUTANEOUS | Status: DC | PRN

## 2024-12-03 MED ORDER — ANTICOAGULANT SODIUM CITRATE 4% (200MG/5ML) IV SOLN: 5.0000 mL | Status: DC | PRN

## 2024-12-03 MED ORDER — ALTEPLASE 2 MG IJ SOLR: 2.0000 mg | Freq: Once | INTRAMUSCULAR | Status: DC | PRN

## 2024-12-03 MED ORDER — LIDOCAINE HCL (PF) 1 % IJ SOLN: 5.0000 mL | INTRAMUSCULAR | Status: DC | PRN

## 2024-12-03 NOTE — Progress Notes (Signed)
 Patient given discharge instructions upon returning from dialysis. IV removed without issue. Patient's belongings collected and returned to patient. Patient transported off unit by wheelchair with sister.

## 2024-12-03 NOTE — Discharge Summary (Signed)
 " Physician Discharge Summary   Patient: [name] MRN: [id number] DOB: [birth year]  Admit date:     11/29/2024  Discharge date: 12/03/2024  Discharge Physician: Duffy Al-Sultani   PCP: Garald Karlynn GAILS, MD   Recommendations at discharge:   Follow up with PCP within 1 week of discharge Need to check CBC within 3-5 days of discharge or if has bloody bowel movements, can be done at HD or PCP office Follow up with GI as scheduled by GI team (next appt 12/11/2024) Follow routine HD schedule  Discharge Diagnoses: Principal Problem:   Colitis with rectal bleeding Active Problems:   Chronic hypotension   ESRD on hemodialysis ([name])   Anemia of chronic disease   Anxiety and depression   Type 2 diabetes mellitus with chronic kidney disease, without long-term current use of insulin  ([name])   Acute on chronic blood loss anemia   Generalized abdominal pain  Resolved Problems:   * No resolved hospital problems. *   Hospital Course:  [name] is a 73 y.o. female with medical history significant for ESRD on TTS HD, T2DM, chronic hypotension, anemia of chronic disease, depression/anxiety, chronic pain, and history of recurrent ischemic colitis who is admitted with recurrent rectal bleeding in setting of persistent colitis.    Recurrent rectal bleeding in setting of persistent colitis Patient with 2 recent admissions for the same, both times managed conservatively with antibiotics.  She developed recurrent rectal bleeding the day prior to this admission with another episode after dialysis on the day of admission.  Repeat CTA A/P showed persistent moderate narrowing involving the cecum. Colonoscopy (12/20) showed proximal ascending colon ischemic appearing ulceration with adherent clot and active arterial bleeding status post epi injection and 3 clips with hemostasis, noted sigmoid diverticulosis. GI followed the patient through the hospitalization, indicated current colonic findings are  consistent with an ischemic etiology associated with her ESRD. She was confirmed to have stopped taking sevelamer   in 2023. Ultimately, the patient was observed for 2 days after the colonoscopy to ensure that no further episodes of bleeding occur requiring intervention (plan was to get IR involved if so), with Hgb remaining stable and no episodes of bloody bowel movements. Her diet was advanced sequentially, ultimately tolerating a regular diet without issue. Hgb 8.6 at the time of discharge. The patient is to follow up with GI in clinic on 12/11/2024. Strict return precautions for recurrent GI bleed, hypotensions, dizziness, syncope amongst others were given.    Blood loss anemia Anemia of chronic disease - Hemoglobin 8.8 on admission, down to nadir of 7.2 - Status post transfusion of 1 unit pRBC (12/19) with improvement to 8.9 - Hgb stable at 8.6 at the time of discharge.  - Will need to repeat CBC in 3-5 days after discharge. Can be done by PCP, at HD, or at an urgent care if the holiday schedule precludes scheduling a PCP appointment.    Chronic hypotension - Continue midodrine  10 mg 3 times daily   ESRD on TTS HD - Nephrology consulted for routine HD - Underwent routine HD prior to discharge on 12/22 - Continue cinacalcet  on TTS   Type 2 diabetes Depression/anxiety  Chronic pain - Home meds resumed at discharge without changes - Ozempic  was paused PTA, will need to discuss continuation or complete discontinuation with PCP    At the time of discharge, the patient was hemodynamically stable with improved symptoms, clinically appropriate for discharge, and in no acute distress. The discharge plan, follow-up instructions,  return precautions, and medication changes were reviewed in detail. The patient verbalized understanding, was in agreement with the plan, and had all questions answered prior to leaving the hospital.       Consultants: GI, nephrology Procedures performed: Colonoscopy  12/20  Disposition: Home Diet recommendation:  Diet Orders (From admission, onward)     Start     Ordered   12/03/24 0907  Diet Carb Modified Room service appropriate? Yes  Diet effective now       Question Answer Comment  Diet-HS Snack? Nothing   Calorie Level Medium 1600-2000   Fluid consistency: Thin   Room service appropriate? Yes      12/03/24 0906            DISCHARGE MEDICATION: Allergies as of 12/03/2024       Reactions   Erythromycin  Diarrhea, Nausea And Vomiting   Gabapentin Other (See Comments)   Confusion and falling  Hallucinations   Lyrica [pregabalin] Other (See Comments)   Confusion and falling Hallucinations    Other Itching, Other (See Comments)   Patient stated it was a soap she used while in the hospital that you don't have to wash off with the combination of Diclofenac  Sodium Gel. Caused Itching and burning.        Medication List     PAUSE taking these medications    Ozempic  (0.25 or 0.5 MG/DOSE) 2 MG/3ML Sopn Wait to take this until your doctor or other care provider tells you to start again. Generic drug: Semaglutide (0.25 or 0.5MG /DOS) INJECT SUBCUTANEOUSLY 0.5 MG  EVERY WEEK       TAKE these medications    Accu-Chek Softclix Lancets lancets Use 1 lancet up to 4 times daily as directed   acetaminophen  500 MG tablet Commonly known as: TYLENOL  Take 1,000 mg by mouth 2 (two) times daily as needed for headache.   acidophilus Caps capsule Take 2 capsules by mouth daily. What changed:  how much to take when to take this   buPROPion  300 MG 24 hr tablet Commonly known as: Wellbutrin  XL Take 1 tablet (300 mg total) by mouth in the morning.   carboxymethylcellulose 0.5 % Soln Commonly known as: REFRESH PLUS Place 1 drop into both eyes 3 (three) times daily as needed (dry eyes/irritation).   cinacalcet  30 MG tablet Commonly known as: SENSIPAR  Take 30 mg by mouth Every Tuesday,Thursday,and Saturday with dialysis.    cyclobenzaprine  10 MG tablet Commonly known as: FLEXERIL  Take 1 tablet (10 mg total) by mouth 3 (three) times daily as needed for muscle spasms.   diphenhydramine -acetaminophen  25-500 MG Tabs tablet Commonly known as: TYLENOL  PM Take 2 tablets by mouth 3 (three) times a week. At bedtime What changed:  when to take this reasons to take this additional instructions   docusate sodium  50 MG capsule Commonly known as: COLACE Take 50-100 mg by mouth 2 (two) times daily. 1 qam, 2 qpm   feeding supplement (NEPRO CARB STEADY) Liqd Take 237 mLs by mouth as needed (missed meal during dialysis.).   FLUoxetine  40 MG capsule Commonly known as: PROZAC  Take 1 capsule (40 mg total) by mouth in the morning.   fluticasone  50 MCG/ACT nasal spray Commonly known as: FLONASE  Place 2 sprays into both nostrils daily. What changed:  when to take this reasons to take this   glimepiride  1 MG tablet Commonly known as: AMARYL  Take 1 tablet (1 mg total) by mouth daily with breakfast.   ketoconazole  2 % cream Commonly known as: NIZORAL  Apply  1 Application topically daily. What changed:  when to take this reasons to take this   lanthanum 1000 MG chewable tablet Commonly known as: FOSRENOL Chew 1,000-2,000 mg by mouth See admin instructions. Taking 2000 mg with meals and 1000 mg  with snacks   lidocaine  5 % Commonly known as: LIDODERM  Place 1 patch onto the skin as needed. Remove & Discard patch within 12 hours or as directed by MD   LORazepam  2 MG tablet Commonly known as: ATIVAN  Take 1 tablet (2 mg total) by mouth 2 (two) times daily as needed for anxiety.   midodrine  10 MG tablet Commonly known as: PROAMATINE  Take 1 tablet (10 mg total) by mouth in the morning, at noon, and at bedtime.   naloxone  4 MG/0.1ML Liqd nasal spray kit Commonly known as: Narcan  Take by nasal route every 3 minutes until patient awakes or EMS arrives.   oxyCODONE  15 MG immediate release tablet Commonly known  as: ROXICODONE  Take 1 tablet (15 mg total) by mouth 4 (four) times daily as needed.   pantoprazole  40 MG tablet Commonly known as: PROTONIX  TAKE 1 TABLET BY MOUTH TWICE  DAILY   pilocarpine  5 MG tablet Commonly known as: SALAGEN  TAKE 1 TABLET BY MOUTH TWICE  DAILY   pioglitazone  15 MG tablet Commonly known as: ACTOS  Take 1 tablet (15 mg total) by mouth daily.   polyethylene glycol 17 g packet Commonly known as: MIRALAX  / GLYCOLAX  Take 17 g by mouth daily as needed for moderate constipation.   PreserVision AREDS 2 Caps Take 1 capsule by mouth in the morning and at bedtime.   PreviDent 5000 Dry Mouth 1.1 % Gel dental gel Generic drug: sodium fluoride Place 1 application  onto teeth in the morning and at bedtime.   RENA-VITE PO Take 1 tablet by mouth every morning.   Repatha  SureClick 140 MG/ML Soaj Generic drug: Evolocumab  INJECT 1 PEN SUBCUTANEOUSLY  EVERY 2 WEEKS         Discharge Exam: Filed Weights   11/29/24 1421 12/01/24 1154 12/01/24 1650  Weight: 82.4 kg 84.5 kg 83.8 kg   Blood pressure (!) 126/57, pulse 67, temperature 98.6 F (37 C), temperature source Oral, resp. rate 18, height 5' 0.5 (1.537 m), weight 83.8 kg, SpO2 96%.   Gen: NAD, A&Ox3 HEENT: NCAT, EOMI Neck: Supple, no JVD CV: RRR, no murmurs Resp: normal WOB, CTAB, no w/r/r Abd: Soft, nondistended, non-tender to palpation, no guarding Ext: No LE edema, RUE fistula Skin: Warm, dry, no rashes/lesions Neuro: No focal deficits Psych: Calm, cooperative, appropriate affect  Condition at discharge: good  The results of significant diagnostics from this hospitalization (including imaging, microbiology, ancillary and laboratory) are listed below for reference.   Imaging Studies: CT Angio Abd/Pel W and/or Wo Contrast Result Date: 11/29/2024 CLINICAL DATA:  Chronic mesenteric ischemia.  Rectal bleeding. EXAM: CTA ABDOMEN AND PELVIS WITHOUT AND WITH CONTRAST TECHNIQUE: Multidetector CT imaging of  the abdomen and pelvis was performed using the standard protocol during bolus administration of intravenous contrast. Multiplanar reconstructed images and MIPs were obtained and reviewed to evaluate the vascular anatomy. RADIATION DOSE REDUCTION: This exam was performed according to the departmental dose-optimization program which includes automated exposure control, adjustment of the mA and/or kV according to patient size and/or use of iterative reconstruction technique. CONTRAST:  85mL OMNIPAQUE  IOHEXOL  350 MG/ML SOLN COMPARISON:  Twelve days ago FINDINGS: VASCULAR Aorta: Normal caliber aorta without aneurysm, dissection, vasculitis or significant stenosis. Aortic atherosclerosis. Celiac: Patent without evidence of aneurysm, dissection,  vasculitis or significant stenosis. SMA: Patent without evidence of aneurysm, dissection, vasculitis or significant stenosis. Renals: Both renal arteries are patent without evidence of aneurysm or dissection, although they are diminutive in caliber consistent with history of end-stage renal disease. IMA: Patent without evidence of aneurysm, dissection, vasculitis or significant stenosis. Inflow: Patent without evidence of aneurysm, dissection, vasculitis or significant stenosis. Proximal Outflow: Bilateral common femoral and visualized portions of the superficial and profunda femoral arteries are patent without evidence of aneurysm, dissection, vasculitis or significant stenosis. Veins: No obvious venous abnormality within the limitations of this arterial phase study. Review of the MIP images confirms the above findings. NON-VASCULAR Lower chest: No acute abnormality. Hepatobiliary: No focal liver abnormality is seen. No gallstones, gallbladder wall thickening, or biliary dilatation. Pancreas: Unremarkable. No pancreatic ductal dilatation or surrounding inflammatory changes. Spleen: Normal in size without focal abnormality. Adrenals/Urinary Tract: Adrenal glands appear normal.  Bilateral renal atrophy is noted with multiple cyst formation consistent history of end-stage renal disease. Urinary bladder is decompressed. Stomach/Bowel: Stomach is unremarkable. There is no evidence of bowel obstruction. Large amount of residual contrast is noted throughout the colon, preventing evaluation for active gastrointestinal bleeding. Persistent moderate narrowing is seen involving the cecum which may represent focal inflammation or possibly colonic malignancy. Colonoscopy is recommended. Lymphatic: Mildly enlarged mesenteric lymph nodes are noted in the right lower quadrant which most likely are inflammatory or reactive in etiology, but metastatic disease cannot be excluded. Reproductive: Uterus and bilateral adnexa are unremarkable. Other: Moderate size fat containing periumbilical hernia. No ascites. Musculoskeletal: No acute or significant osseous findings. IMPRESSION: 1. Persistent moderate narrowing is seen involving the cecum which may represent focal inflammation or possibly colonic malignancy. Colonoscopy is recommended. Mildly enlarged mesenteric lymph nodes are noted in the right lower quadrant which most likely are inflammatory or reactive in etiology, but metastatic disease cannot be excluded. 2. Large amount of residual contrast is noted throughout the colon, preventing evaluation for active gastrointestinal bleeding. 3.    Moderate size fat containing periumbilical hernia. Aortic Atherosclerosis (ICD10-I70.0). Electronically Signed   By: Lynwood Landy Raddle M.D.   On: 11/29/2024 16:10   CT Angio Abd/Pel W and/or Wo Contrast Result Date: 11/17/2024 CLINICAL DATA:  Lower GI bleed.  Diarrhea with bright red blood. EXAM: CTA ABDOMEN AND PELVIS WITHOUT AND WITH CONTRAST TECHNIQUE: Multidetector CT imaging of the abdomen and pelvis was performed using the standard protocol during bolus administration of intravenous contrast. Multiplanar reconstructed images and MIPs were obtained and reviewed to  evaluate the vascular anatomy. RADIATION DOSE REDUCTION: This exam was performed according to the departmental dose-optimization program which includes automated exposure control, adjustment of the mA and/or kV according to patient size and/or use of iterative reconstruction technique. CONTRAST:  OMNIPAQUE  IOHEXOL  350 MG/ML SOLN COMPARISON:  11/08/2024. FINDINGS: VASCULAR Aorta: Normal caliber aorta without aneurysm, dissection, vasculitis or significant stenosis. Aortic atherosclerosis. Celiac: Patent without evidence of aneurysm, dissection, vasculitis or significant stenosis. SMA: Patent without evidence of aneurysm, dissection, vasculitis or significant stenosis. Renals: Both renal arteries are patent without evidence of aneurysm or dissection. There is moderate renal artery stenosis on the right. The left renal artery is patent. IMA: Patent without evidence of aneurysm, dissection, vasculitis or significant stenosis. Inflow: Patent without evidence of aneurysm, dissection, vasculitis or significant stenosis. Proximal Outflow: Bilateral common femoral and visualized portions of the superficial and profunda femoral arteries are patent without evidence of aneurysm, dissection, vasculitis or significant stenosis. Veins: No obvious venous abnormality within the limitations  of this arterial phase study. Review of the MIP images confirms the above findings. NON-VASCULAR Lower chest: There is a 4 mm ground-glass nodule in the right lower lobe, axial image 34. There is a 5 mm ground-glass nodule in the right lower lobe, axial image 51. Minimal atelectasis or scarring is noted at the lung bases. Hepatobiliary: No focal liver abnormality is seen. No gallstones, gallbladder wall thickening, or biliary dilatation. Pancreas: Unremarkable. No pancreatic ductal dilatation or surrounding inflammatory changes. Spleen: Normal in size without focal abnormality. Adrenals/Urinary Tract: The adrenal glands are within normal  limits. Renal atrophy is noted bilaterally. There are multiple renal cysts bilaterally. No renal calculus or hydronephrosis. The bladder is unremarkable. Stomach/Bowel: The stomach is within normal limits. No bowel obstruction, free air, or pneumatosis is seen. Evaluation for hemorrhage is limited due to the presence of oral contrast. A few scattered diverticula are noted along the colon without evidence of diverticulitis. There is persistent circumferential wall thickening involving the ascending colon with mild associated fat stranding. Appendix is not seen. Lymphatic: Enlarged lymph nodes are noted in the right lower quadrant adjacent to the region of colonic wall thickening. Reproductive: Uterine fibroids are noted.  No adnexal mass is seen. Other: No abdominopelvic ascites. Large fat containing umbilical hernia is present. Musculoskeletal: Degenerative changes are noted in the thoracolumbar spine. No acute osseous abnormality is identified. IMPRESSION: VASCULAR 1. No evidence of active hemorrhage, however evaluation of the bowel is limited due to the presence of enteric contrast. 2. Aortic atherosclerosis. NON-VASCULAR 1. No definite evidence of acute or active hemorrhage. Evaluation is limited due to the presence of oral contrast. 2. Persistent circumferential colonic wall thickening involving the ascending colon with associated inflammation and lymphadenopathy, which may be infectious, inflammatory, or neoplastic. Correlation with colonoscopy is recommended. 3. Diverticulosis without diverticulitis. 4. Right lower lobe ground-glass nodules measuring up to 5 mm. Follow-up by chest CT without contrast is recommended in 2 years, and again at 4 years to confirm stability. This recommendation follows the consensus statement: Recommendations for the Management of Subsolid Pulmonary Nodules Detected at CT: A Statement from the Fleischner Society as published in Radiology 2013; 266:304-317. 5. Remaining incidental  findings as described above. Electronically Signed   By: Leita Birmingham M.D.   On: 11/17/2024 14:38   CT ANGIO ABDOMEN W &/OR WO CONTRAST Result Date: 11/08/2024 CLINICAL DATA:  Aortic atherosclerosis. EXAM: CT ANGIOGRAPHY ABDOMEN TECHNIQUE: Multidetector CT imaging of the abdomen was performed using the standard protocol during bolus administration of intravenous contrast. Multiplanar reconstructed images and MIPs were obtained and reviewed to evaluate the vascular anatomy. RADIATION DOSE REDUCTION: This exam was performed according to the departmental dose-optimization program which includes automated exposure control, adjustment of the mA and/or kV according to patient size and/or use of iterative reconstruction technique. CONTRAST:  75mL OMNIPAQUE  IOHEXOL  350 MG/ML SOLN COMPARISON:  CT, 11/06/2024, performed for abdominal pain. FINDINGS: VASCULAR Aorta: Aorta normal in caliber. Mild aortic atherosclerosis. No dissection or stenosis. Celiac: Patent without evidence of aneurysm, dissection, vasculitis or significant stenosis. SMA: Patent without evidence of aneurysm, dissection, vasculitis or significant stenosis. Renals: Small dual right renal arteries. Mild calcified plaque at the origin of the larger of the 2, without significant stenosis. Single left renal artery without significant stenosis. IMA: Widely patent inferior mesenteric artery. Inflow: Widely patent common iliac arteries. Veins: No obvious venous abnormality within the limitations of this arterial phase study. Review of the MIP images confirms the above findings. NON-VASCULAR Lower chest: Trace pleural effusions.  Mild dependent atelectasis. Hepatobiliary: Liver is unremarkable. Gallbladder is distended. No wall thickening or adjacent inflammation. No stones. No bile duct dilation. Pancreas: Atrophy, otherwise unremarkable. No pancreatic ductal dilatation or surrounding inflammatory changes. Spleen: Normal in size without focal abnormality.  Adrenals/Urinary Tract: No adrenal mass. Bilateral renal atrophy. 3 cm left midpole renal cyst. No stones. No hydronephrosis. Visualized ureters are unremarkable. Stomach/Bowel: Concentric wall thickening of the ascending colon with mild adjacent inflammatory haziness as well as a prominent mesenteric lymph nodes. These findings are stable from the recent prior exam. No bowel dilation to suggest obstruction. No other wall thickening or inflammatory changes. Lymphatic: Prominent lymph nodes adjacent to the affected ascending colon. No other evidence of adenopathy. Other: Partly imaged midline fat containing hernia, stable from the previous exam. Musculoskeletal: No fracture or acute finding.  No bone lesion. IMPRESSION: VASCULAR 1. Mild aortic atherosclerosis. No aneurysm. No dissection. No hemodynamically significant stenosis of the aorta or evidence branch vessels. NON-VASCULAR 1. Bilateral renal atrophy with associated small renal arteries. 2. Right colon circumferential wall thickening with associated inflammation and prominent adjacent mesenteric lymph nodes. This is unchanged compared to the exam from 2 days ago. Differential diagnosis again includes neoplastic disease and infection/inflammation. Electronically Signed   By: Alm Parkins M.D.   On: 11/08/2024 15:52   CT ABDOMEN PELVIS WO CONTRAST Result Date: 11/06/2024 EXAM: CT ABDOMEN AND PELVIS WITHOUT CONTRAST 11/06/2024 05:40:33 PM TECHNIQUE: CT of the abdomen and pelvis was performed without the administration of intravenous contrast. Multiplanar reformatted images are provided for review. Automated exposure control, iterative reconstruction, and/or weight-based adjustment of the mA/kV was utilized to reduce the radiation dose to as low as reasonably achievable. COMPARISON: Chest x-ray 11/06/2024, CT chest abdomen pelvis 03/10/2022. CLINICAL HISTORY: Abdominal pain, acute, nonlocalized. FINDINGS: LOWER CHEST: Coronary vascular calcifications. LIVER:  The liver is unremarkable. GALLBLADDER AND BILE DUCTS: Distended gallbladder without calcified stone or right upper quadrant inflammation. No biliary ductal dilatation. SPLEEN: No acute abnormality. PANCREAS: Atrophic pancreas without inflammation. ADRENAL GLANDS: No acute abnormality. KIDNEYS, URETERS AND BLADDER: Atrophic kidneys without hydronephrosis. Simple and complex cysts within the bilateral kidneys. Per consensus, no follow-up is needed for simple Bosniak type 1 and 2 renal cysts, unless the patient has a malignancy history or risk factors. No stones in the kidneys or ureters. No perinephric or periureteral stranding. Decompressed urinary bladder. GI AND BOWEL: Stomach demonstrates no acute abnormality. Dense material within the colon. Diverticular disease of the left colon. Circumferential wall thickening of the ascending colon with pericolonic stranding, series 3 image 44. Moderate to large colonic stool burden. No extraluminal gas to suggest a perforation. There is no bowel obstruction. PERITONEUM AND RETROPERITONEUM: No ascites. No free air. VASCULATURE: Vascular structures are limited without intravenous contrast. Aorta is normal in caliber. LYMPH NODES: Few small right lower quadrant mesenteric nodes measuring up to 9 mm. REPRODUCTIVE ORGANS: No acute abnormality. BONES AND SOFT TISSUES: Scoliosis and degenerative changes of the spine. No acute osseous abnormality. Moderate fat-containing umbilical hernia. No focal soft tissue abnormality. IMPRESSION: 1. Circumferential wall thickening of the ascending colon with pericolonic stranding, no evidence for perforation. Findings could be secondary to focal colitis of infectious, inflammatory, or ischemic etiology; however, inflammatory colonic neoplasm is also a concern. Recommend correlation with colonoscopy. 2. Diverticular disease of the left colon without evidence of diverticulitis. 3. Markedly distended gallbladder without calcified stone or  inflammation 4. Atrophic kidneys. Simple and complex cysts within both kidneys, nonemergent follow-up renal CT or MRI could be considered.  Electronically signed by: Luke Bun MD 11/06/2024 06:09 PM EST RP Workstation: [id number]   DG Chest Port 1 View Result Date: 11/06/2024 EXAM: 1 VIEW(S) XRAY OF THE CHEST 11/06/2024 04:15:00 PM COMPARISON: 06/07/2023 CLINICAL HISTORY: Questionable sepsis - evaluate for abnormality FINDINGS: LUNGS AND PLEURA: Elevated right hemidiaphragm. No focal pulmonary opacity. No pleural effusion. No pneumothorax. HEART AND MEDIASTINUM: No acute abnormality of the cardiac and mediastinal silhouettes. BONES AND SOFT TISSUES: Right reverse shoulder arthroplasty noted. Asymmetric spurring of the right acromioclavicular joint. Thoracic spondylosis. Dextroconvex thoracic scoliosis. No acute osseous abnormality. IMPRESSION: 1. No acute cardiopulmonary abnormality. 2. Right reverse shoulder arthroplasty. 3. Elevated right hemidiaphragm. 4. Asymmetric degenerative changes of the right acromioclavicular joint. 5. Thoracic spondylosis. 6. Dextroconvex thoracic scoliosis. Electronically signed by: Ryan Salvage MD 11/06/2024 04:42 PM EST RP Workstation: [id number]    Microbiology: Results for orders placed or performed during the hospital encounter of 11/06/24  Blood Culture (routine x 2)     Status: None   Collection Time: 11/06/24  3:59 PM   Specimen: BLOOD  Result Value Ref Range Status   Specimen Description BLOOD SITE NOT SPECIFIED  Final   Special Requests   Final    BOTTLES DRAWN AEROBIC AND ANAEROBIC Blood Culture adequate volume   Culture   Final    NO GROWTH 5 DAYS Performed at [facility], [address], [place], [place] [address]    Report Status 11/11/2024 FINAL  Final  Resp panel by RT-PCR (RSV, Flu A&B, Covid) Anterior Nasal Swab     Status: None   Collection Time: 11/06/24  4:08 PM   Specimen: Anterior Nasal Swab  Result Value Ref Range Status    SARS Coronavirus 2 by RT PCR NEGATIVE NEGATIVE Final   Influenza A by PCR NEGATIVE NEGATIVE Final   Influenza B by PCR NEGATIVE NEGATIVE Final    Comment: (NOTE) The Xpert Xpress SARS-CoV-2/FLU/RSV plus assay is intended as an aid in the diagnosis of influenza from Nasopharyngeal swab specimens and should not be used as a sole basis for treatment. Nasal washings and aspirates are unacceptable for Xpert Xpress SARS-CoV-2/FLU/RSV testing.  Fact Sheet for Patients: bloggercourse.com  Fact Sheet for Healthcare Providers: seriousbroker.it  This test is not yet approved or cleared by the [place]  FDA and has been authorized for detection and/or diagnosis of SARS-CoV-2 by FDA under an Emergency Use Authorization (EUA). This EUA will remain in effect (meaning this test can be used) for the duration of the COVID-19 declaration under Section 564(b)(1) of the Act, 21 U.S.C. section 360bbb-3(b)(1), unless the authorization is terminated or revoked.     Resp Syncytial Virus by PCR NEGATIVE NEGATIVE Final    Comment: (NOTE) Fact Sheet for Patients: bloggercourse.com  Fact Sheet for Healthcare Providers: seriousbroker.it  This test is not yet approved or cleared by the [place]  FDA and has been authorized for detection and/or diagnosis of SARS-CoV-2 by FDA under an Emergency Use Authorization (EUA). This EUA will remain in effect (meaning this test can be used) for the duration of the COVID-19 declaration under Section 564(b)(1) of the Act, 21 U.S.C. section 360bbb-3(b)(1), unless the authorization is terminated or revoked.  Performed at [facility], [address] [place], [place] [address]   Blood Culture (routine x 2)     Status: None   Collection Time: 11/06/24  4:08 PM   Specimen: BLOOD  Result Value Ref Range Status   Specimen Description BLOOD SITE NOT SPECIFIED   Final  Special Requests   Final    BOTTLES DRAWN AEROBIC AND ANAEROBIC Blood Culture results may not be optimal due to an inadequate volume of blood received in culture bottles   Culture   Final    NO GROWTH 5 DAYS Performed at [facility], [address] [place], [place] [address]    Report Status 11/11/2024 FINAL  Final  MRSA Next Gen by PCR, Nasal     Status: None   Collection Time: 11/06/24  7:06 PM   Specimen: Nasal Mucosa; Nasal Swab  Result Value Ref Range Status   MRSA by PCR Next Gen NOT DETECTED NOT DETECTED Final    Comment: (NOTE) The GeneXpert MRSA Assay (FDA approved for NASAL specimens only), is one component of a comprehensive MRSA colonization surveillance program. It is not intended to diagnose MRSA infection nor to guide or monitor treatment for MRSA infections. Test performance is not FDA approved in patients less than 34 years old. Performed at [facility], [address], [place], [place] [address]   Gastrointestinal Panel by PCR , Stool     Status: None   Collection Time: 11/09/24 10:03 AM   Specimen: Stool  Result Value Ref Range Status   Campylobacter species NOT DETECTED NOT DETECTED Final   Plesimonas shigelloides NOT DETECTED NOT DETECTED Final   Salmonella species NOT DETECTED NOT DETECTED Final   Yersinia enterocolitica NOT DETECTED NOT DETECTED Final   Vibrio species NOT DETECTED NOT DETECTED Final   Vibrio cholerae NOT DETECTED NOT DETECTED Final   Enteroaggregative E coli (EAEC) NOT DETECTED NOT DETECTED Final   Enteropathogenic E coli (EPEC) NOT DETECTED NOT DETECTED Final   Enterotoxigenic E coli (ETEC) NOT DETECTED NOT DETECTED Final   Shiga like toxin producing E coli (STEC) NOT DETECTED NOT DETECTED Final   Shigella/Enteroinvasive E coli (EIEC) NOT DETECTED NOT DETECTED Final   Cryptosporidium NOT DETECTED NOT DETECTED Final   Cyclospora cayetanensis NOT DETECTED NOT DETECTED Final   Entamoeba histolytica NOT DETECTED NOT  DETECTED Final   Giardia lamblia NOT DETECTED NOT DETECTED Final   Adenovirus F40/41 NOT DETECTED NOT DETECTED Final   Astrovirus NOT DETECTED NOT DETECTED Final   Norovirus GI/GII NOT DETECTED NOT DETECTED Final   Rotavirus A NOT DETECTED NOT DETECTED Final   Sapovirus (I, II, IV, and V) NOT DETECTED NOT DETECTED Final    Comment: Performed at [facility], [address] [place], [place] [address]  Calprotectin, Fecal     Status: Abnormal   Collection Time: 11/09/24 10:04 AM   Specimen: Stool  Result Value Ref Range Status   Calprotectin, Fecal 842 (H) 0 - 120 ug/g Final    Comment: (NOTE) **Results verified by repeat testing** Concentration     Interpretation   Follow-Up < 5 - 50 ug/g     Normal           None >50 -120 ug/g     Borderline       Re-evaluate in 4-6 weeks    >120 ug/g     Abnormal         Repeat as clinically                                   indicated Performed At: [facility] [address] [place], [place] [id number] Jennette Shorter MD Ey:[id number]    *Note: Due to a large number of results  and/or encounters for the requested time period, some results have not been displayed. A complete set of results can be found in Results Review.    Labs: CBC: Recent Labs  Lab 11/29/24 1432 11/29/24 2200 11/30/24 0403 11/30/24 1237 12/01/24 0625 12/01/24 1157 12/01/24 1827 12/02/24 0547 12/03/24 0310  WBC 13.5*  --  8.2  --   --   --   --  7.0 7.5  HGB 8.8*   < > 7.4*   < > 8.4* 8.4* 8.6* 7.9* 8.6*  HCT 28.9*   < > 24.4*   < > 25.6* 26.7* 26.7* 25.3* 26.9*  MCV 103.2*  --  103.0*  --   --   --   --  97.3 95.7  PLT 348  --  275  --   --   --   --  261 305   < > = values in this interval not displayed.   Basic Metabolic Panel: Recent Labs  Lab 11/29/24 1432 11/30/24 0403 12/01/24 1157 12/02/24 0547 12/03/24 0310  NA 137 136 128* 132* 131*  K 3.7 3.5 4.4 4.3 3.7  CL 91* 92* 86* 91* 90*  CO2 28 32 26 28 26   GLUCOSE 188* 93 97 102* 62*   BUN 11 17 24* 13 21  CREATININE 2.20* 3.11* 4.60* 2.76* 4.00*  CALCIUM 8.7* 8.3* 8.5* 8.1* 8.4*  PHOS  --   --  5.4*  --   --    Liver Function Tests: Recent Labs  Lab 11/29/24 1432 12/01/24 1157 12/02/24 1454  AST 19  --  54*  ALT 12  --  20  ALKPHOS 128*  --  78  BILITOT 0.3  --  1.1  PROT 7.3  --  5.7*  ALBUMIN  4.2 3.5 3.1*   CBG: Recent Labs  Lab 12/03/24 0256 12/03/24 0258 12/03/24 0314 12/03/24 0621 12/03/24 0844  GLUCAP 46* 45* 71 157* 134*    Discharge time spent: Time Coordinating Discharge: I spent a total of 35 minutes engaged in face-to-face discussion with the patient and/or caregivers regarding the patients care, assessment, plan, and discharge disposition. Over 50% of this time was dedicated to counseling the patient on the risks and benefits of treatment options and the discharge plan, as well as coordinating post-discharge care.   Signed: Artelia Game Al-Sultani, MD [facility] 12/03/2024         "

## 2024-12-03 NOTE — Inpatient Diabetes Management (Signed)
 Inpatient Diabetes Program Recommendations  AACE/ADA: New Consensus Statement on Inpatient Glycemic Control (2015)  Target Ranges:  Prepandial:   less than 140 mg/dL      Peak postprandial:   less than 180 mg/dL (1-2 hours)      Critically ill patients:  140 - 180 mg/dL   Lab Results  Component Value Date   GLUCAP 134 (H) 12/03/2024   HGBA1C 6.1 (A) 09/12/2024    Review of Glycemic Control  Latest Reference Range & Units 12/02/24 19:46 12/02/24 23:52 12/03/24 02:56 12/03/24 02:58 12/03/24 03:14 12/03/24 06:21 12/03/24 08:44  Glucose-Capillary 70 - 99 mg/dL 879 (H) 878 (H)  Novolog  1 unit 46 (L) 45 (L) 71 157 (H) 134 (H)  (H): Data is abnormally high (L): Data is abnormally low  Diabetes history: DM2 Outpatient Diabetes medications: Amaryl  1 mg every day, Actos  15 mg every day, Ozempic -not taking Current orders for Inpatient glycemic control: Novolog  0-9 units Q4H  Inpatient Diabetes Program Recommendations:    Hypo after receiving 1 unit of insulin .  Please consider:  Novolog  0-6 units Q4H.  Thank you, Wyvonna Pinal, MSN, CDCES Diabetes Coordinator Inpatient Diabetes Program [phone] (team pager from 8a-5p)

## 2024-12-03 NOTE — Progress Notes (Signed)
 Received patient in bed to unit.  Alert and oriented.  Informed consent signed and in chart.   TX duration: 3 hours and 15 minutes  Patient tolerated well.  Transported back to the room  Alert, without acute distress.  Hand-off given to patient's nurse.   Access used: R Upper arm fistula Access issues: none  Total UF removed: 1L   12/03/24 1627  Vitals  Temp 98.4 F (36.9 C)  Temp Source Oral  BP 112/69  MAP (mmHg) 80  BP Location Left Arm  BP Method Automatic  Patient Position (if appropriate) Lying  Pulse Rate 81  Pulse Rate Source Monitor  ECG Heart Rate 80  Resp 18  Oxygen Therapy  SpO2 98 %  O2 Device Room Air  During Treatment Monitoring  Duration of HD Treatment -hour(s) 3.25 hour(s)  HD Safety Checks Performed Yes  Intra-Hemodialysis Comments Tx completed  Post Treatment  Dialyzer Clearance Clear  Liters Processed 78  Fluid Removed (mL) 1000 mL  Tolerated HD Treatment Yes  Post-Hemodialysis Comments tolerated well  AVG/AVF Arterial Site Held (minutes) 7 minutes  AVG/AVF Venous Site Held (minutes) 7 minutes  Fistula / Graft Right Upper arm Arteriovenous fistula  Placement Date/Time: (c) 09/12/20 1316   Placed prior to admission: No  Orientation: Right  Access Location: Upper arm  Access Type: Arteriovenous fistula  Site Condition No complications  Fistula / Graft Assessment Thrill;Present;Bruit  Status Patent;Deaccessed  Drainage Description None     Camellia Brasil LPN [facility]

## 2024-12-03 NOTE — Progress Notes (Signed)
 Patient's blood glucose dropped to 45 at 0300. Recheck showed 71 after drinking apple juice (120 ml). Increased to 157 at 0621 this morning.

## 2024-12-03 NOTE — Care Management Important Message (Signed)
 Important Message  Patient Details  Name: [name] MRN: [id number] Date of Birth: 01-03-1951   Important Message Given:  Yes - Medicare IM     Jennie Laneta Dragon 12/03/2024, 2:18 PM

## 2024-12-03 NOTE — Plan of Care (Signed)
  Problem: Education: Goal: Ability to describe self-care measures that may prevent or decrease complications (Diabetes Survival Skills Education) will improve Outcome: Progressing Goal: Individualized Educational Video(s) Outcome: Progressing   Problem: Coping: Goal: Ability to adjust to condition or change in health will improve Outcome: Progressing   Problem: Fluid Volume: Goal: Ability to maintain a balanced intake and output will improve Outcome: Progressing   Problem: Health Behavior/Discharge Planning: Goal: Ability to identify and utilize available resources and services will improve Outcome: Progressing Goal: Ability to manage health-related needs will improve Outcome: Progressing   Problem: Metabolic: Goal: Ability to maintain appropriate glucose levels will improve Outcome: Progressing   Problem: Nutritional: Goal: Maintenance of adequate nutrition will improve Outcome: Progressing Goal: Progress toward achieving an optimal weight will improve Outcome: Progressing   Problem: Skin Integrity: Goal: Risk for impaired skin integrity will decrease Outcome: Progressing   Problem: Tissue Perfusion: Goal: Adequacy of tissue perfusion will improve Outcome: Progressing   Problem: Education: Goal: Ability to identify signs and symptoms of gastrointestinal bleeding will improve Outcome: Progressing   Problem: Bowel/Gastric: Goal: Will show no signs and symptoms of gastrointestinal bleeding Outcome: Progressing   Problem: Fluid Volume: Goal: Will show no signs and symptoms of excessive bleeding Outcome: Progressing   Problem: Clinical Measurements: Goal: Complications related to the disease process, condition or treatment will be avoided or minimized Outcome: Progressing

## 2024-12-03 NOTE — Progress Notes (Signed)
 " [facility] Progress Note   Subjective:   Seen in room - for HD this afternoon. No CP/dyspnea. No further rectal bleeding, but hasn't had BM yet.  Objective Vitals:   12/02/24 1944 12/02/24 2353 12/03/24 0322 12/03/24 0810  BP: (!) 144/55 (!) 109/52 (!) 99/56 (!) 126/57  Pulse: 74 70 61 67  Resp: 16 16  18   Temp: 98.5 F (36.9 C) 98.8 F (37.1 C) 97.6 F (36.4 C) 98.6 F (37 C)  TempSrc: Oral Oral Axillary Oral  SpO2: 100% 94% 96% 96%  Weight:      Height:       Physical Exam General: Well appearing, NAD. Room air Heart:  RRR Lungs: CTAB; no rales Abdomen: soft Extremities: no LE edema Dialysis Access: AVF +t/b  Additional Objective Labs: Basic Metabolic Panel: Recent Labs  Lab 12/01/24 1157 12/02/24 0547 12/03/24 0310  NA 128* 132* 131*  K 4.4 4.3 3.7  CL 86* 91* 90*  CO2 26 28 26   GLUCOSE 97 102* 62*  BUN 24* 13 21  CREATININE 4.60* 2.76* 4.00*  CALCIUM 8.5* 8.1* 8.4*  PHOS 5.4*  --   --    Liver Function Tests: Recent Labs  Lab 11/29/24 1432 12/01/24 1157 12/02/24 1454  AST 19  --  54*  ALT 12  --  20  ALKPHOS 128*  --  78  BILITOT 0.3  --  1.1  PROT 7.3  --  5.7*  ALBUMIN  4.2 3.5 3.1*   Recent Labs  Lab 11/29/24 1432  LIPASE 19   CBC: Recent Labs  Lab 11/29/24 1432 11/29/24 2200 11/30/24 0403 11/30/24 1237 12/01/24 1827 12/02/24 0547 12/03/24 0310  WBC 13.5*  --  8.2  --   --  7.0 7.5  HGB 8.8*   < > 7.4*   < > 8.6* 7.9* 8.6*  HCT 28.9*   < > 24.4*   < > 26.7* 25.3* 26.9*  MCV 103.2*  --  103.0*  --   --  97.3 95.7  PLT 348  --  275  --   --  261 305   < > = values in this interval not displayed.   Medications:  anticoagulant sodium citrate       buPROPion   300 mg Oral q AM   Chlorhexidine  Gluconate Cloth  6 each Topical Q0600   cinacalcet   30 mg Oral Q T,Th,Sa-HD   FLUoxetine   40 mg Oral q AM   insulin  aspart  0-9 Units Subcutaneous Q4H   lidocaine   1 patch Transdermal Q24H   midodrine   10 mg Oral TID    pantoprazole   40 mg Oral BID   Dialysis Orders TTS - [facility] 4hr, 400/A1.5, EDW 82.4kg, 2K/2Ca bath, AVF, no heparin  - Mircera 150mcg q 2 weeks - Venofer  50mg  weekly - Calcitriol2.75mcg PO q HD - Sensipar  30mg  PO q HD  Assessment/Plan: Recurrent LGIB: S/p colonoscopy with multiple ascending colon bleeding ulcers treated, also with ischemic area. ESRD: Usual TTS schedule - for HD today per holiday schedule. HypoTN/volume: BP ok on midodrine  10mg  TID, no edema. 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 S/p 1U PRBCs on 12/19. Secondary HPTH: CorrCa ok, Phos ok without binder. 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 Dispo: Plan is dispo later today per patient.  ** Holiday Schedule for THIS week** Usual MWF schedule -> Sun (21), Tues (23), Friday (26) Usual TTS schedule -> Mon (22), Wed (24), Sat (27)  Izetta Boehringer, PA-C 12/03/2024, 11:19 AM  [facility]    "

## 2024-12-03 NOTE — Progress Notes (Addendum)
 Pt receives out-pt HD at [facility], TTS, 0540am chair time. Will continue to assist as needed.    Lavanda Jessia Kief Dialysis Navigator [phone]   Addendum 1:03pm D/c orders noted. Contacted out-pt HD clinic [facility] and informed of pt d/c and anticipated arrival back to clinic. No further support is needed.

## 2024-12-03 NOTE — Progress Notes (Signed)
" ° °   Inpatient Progress Note     Patient Profile/Chief Complaint  73 year old female with ESRD on dialysis, right-sided ischemic colitis with GI bleeding hematochezia, abnormal CTA with persistent moderate narrowing in the cecum representing inflammation versus colonic malignancy.  Colonoscopy 12/01/2024-multiple ulcers in the ascending colon with bleeding as well as an ischemic appearing ulceration proximal to the cecal cap.-treated with epi, 3 Mantis clips deployed, PuraStat applied entire ulcer base.   Interval History   - No acute events overnight - Abdominal pain improved - No further overt bleeding or bowel movements - Hemoglobin trend 8.6 --> 7.9 --> 8.6.    Objective   Vital signs in last 24 hours: Temp:  [97.6 F (36.4 C)-98.8 F (37.1 C)] 98.6 F (37 C) (12/22 0810) Pulse Rate:  [61-74] 67 (12/22 0810) Resp:  [9-18] 18 (12/22 0810) BP: (99-144)/(43-57) 126/57 (12/22 0810) SpO2:  [94 %-100 %] 96 % (12/22 0810) Last BM Date : 12/01/24 General:    Alert, resting in bed no distress Heart:  Regular rate and rhythm; no murmurs Lungs: Respirations even and unlabored, lungs CTA bilaterally Abdomen:  Soft, minimal tenderness to palpation in mid abdomen and nondistended. Normal bowel sounds. Extremities:  Without edema. Neurologic:  Alert and oriented,  grossly normal neurologically. Psych:  Cooperative. Normal mood and affect.  Intake/Output from previous day: 12/21 0701 - 12/22 0700 In: 420 [P.O.:420] Out: 0  Intake/Output this shift: No intake/output data recorded.  Lab Results: Recent Labs    12/01/24 1827 12/02/24 0547 12/03/24 0310  WBC  --  7.0 7.5  HGB 8.6* 7.9* 8.6*  HCT 26.7* 25.3* 26.9*  PLT  --  261 305   BMET Recent Labs    12/01/24 1157 12/02/24 0547 12/03/24 0310  NA 128* 132* 131*  K 4.4 4.3 3.7  CL 86* 91* 90*  CO2 26 28 26   GLUCOSE 97 102* 62*  BUN 24* 13 21  CREATININE 4.60* 2.76* 4.00*  CALCIUM 8.5* 8.1* 8.4*   LFT Recent Labs     12/02/24 1454  PROT 5.7*  ALBUMIN  3.1*  AST 54*  ALT 20  ALKPHOS 78  BILITOT 1.1  BILIDIR 0.5*  IBILI 0.7   PT/INR No results for input(s): LABPROT, INR in the last 72 hours.  Studies/Results: No results found.  Endoscopic Studies: Colonoscopy 12/01/2024 multiple ulcers in the ascending colon with bleeding as well as an ischemic appearing ulceration proximal to the cecal cap.-treated with epi, 3 Mantis clips deployed, PuraStat applied entire ulcer base.   Clinical Impression   73 year old female with ESRD on dialysis, right-sided ischemic colitis with GI bleeding hematochezia, abnormal CTA with persistent moderate narrowing in the cecum representing inflammation versus colonic malignancy.  Colonoscopy 12/01/2024-multiple ulcers in the ascending colon with bleeding as well as an ischemic appearing ulceration proximal to the cecal cap.-treated with epi, 3 Mantis clips deployed, PuraStat applied entire ulcer base.  Patient has been clinically stable since colonoscopy without further overt bleeding.  Hemoglobin trend is reassuring.  Abdominal pain has improved.   Plan  Advance diet today Monitor for overt bleeding If tolerates diet today and no further overt bleeding agree that she is stable for discharge and can follow-up with Dr. Abran.  If she is discharged today we will coordinate clinic follow-up appointment.    LOS: 3 days   [name]  12/03/2024, 11:04 AM  Anita Hausen, MD [facility]  "

## 2024-12-03 NOTE — Discharge Planning (Signed)
 [facility] Patient Discharge Orders - [facility] CLINIC: [facility]  Patient's name: [name] Admit/DC Dates: 11/29/2024 - 12/03/2024  DISCHARGE DIAGNOSES: Recurrent LGIB - s/p another colonoscopy with treated bleeding ulcers Hypotension - increased mido to 10mg  TID  HD ORDER CHANGES: Heparin  change: no EDW Change: yes New EDW: 83kg Bath Change: no  ANEMIA MANAGEMENT: Aranesp : Given: no   ESA dose for discharge: mircera 225 mcg IV q 2 weeks, per usual schedule IV Iron dose at discharge: per protocol  Transfusion: Given: 1U given on 12/19  BONE/MINERAL MEDICATIONS: Hectorol/Calcitriol  change: no Sensipar /Parsabiv change: no  ACCESS INTERVENTION/CHANGE: no Details:   RECENT LABS: Recent Labs  Lab 12/01/24 1157 12/01/24 1827 12/02/24 1454 12/03/24 0310  HGB 8.4*   < >  --  8.6*  NA 128*   < >  --  131*  K 4.4   < >  --  3.7  CALCIUM 8.5*   < >  --  8.4*  PHOS 5.4*  --   --   --   ALBUMIN  3.5  --  3.1*  --    < > = values in this interval not displayed.    IV ANTIBIOTICS: no Details:  OTHER ANTICOAGULATION: no Details:  OTHER/APPTS/LAB ORDERS:  D/C Meds to be reconciled by nurse after every discharge.  Completed By: Izetta Boehringer, PA-C [facility] Pager [phone]   Reviewed by: MD:______ RN_______

## 2024-12-04 ENCOUNTER — Telehealth: Payer: Self-pay

## 2024-12-04 ENCOUNTER — Telehealth: Payer: Self-pay | Admitting: Internal Medicine

## 2024-12-04 NOTE — Telephone Encounter (Signed)
 Inbound call from patient stating she was readmitted to the hospital and had a colonoscopy over the weekend that showed bleeding. Patient states provider at hospital states 1/21 is too far out. Patient is requesting a call to discuss further. Please advise, thank you

## 2024-12-04 NOTE — Telephone Encounter (Signed)
 Pts appt moved to 12/11/24 at 1:30pm with Ellouise Console PA. Pt aware of appt.

## 2024-12-04 NOTE — Transitions of Care (Post Inpatient/ED Visit) (Signed)
 "  12/04/2024  Name: [name] MRN: [id number] DOB: February 05, 1951  Today's TOC FU Call Status: Today's TOC FU Call Status:: Successful TOC FU Call Completed TOC FU Call Complete Date: 12/04/24  Patient's Name and Date of Birth confirmed. DOB, Name  Transition Care Management Follow-up Telephone Call Date of Discharge: 12/03/24 Discharge Facility: [name]) Type of Discharge: Inpatient Admission Primary Inpatient Discharge Diagnosis:: Colitis with rectal bleeding How have you been since you were released from the hospital?: Better Any questions or concerns?: No  Items Reviewed: Did you receive and understand the discharge instructions provided?: Yes Medications obtained,verified, and reconciled?: Yes (Medications Reviewed) Any new allergies since your discharge?: No Dietary orders reviewed?: Yes Type of Diet Ordered:: Renal Do you have support at home?: Yes Name of Support/Comfort Primary Source: patient states sister and friends help as needed - states her condo has an elevator  Medications Reviewed Today: Medications Reviewed Today     Reviewed by Lauro Shona LABOR, RN (Registered Nurse) on 12/04/24 at 1429  Med List Status: <None>   Medication Order Taking? Sig Documenting Provider Last Dose Status Informant  Accu-Chek Softclix Lancets lancets [id number] Yes Use 1 lancet up to 4 times daily as directed Samtani, Jai-Gurmukh, MD  Active Self, Pharmacy Records  acetaminophen  (TYLENOL ) 500 MG tablet [id number] Yes Take 1,000 mg by mouth 2 (two) times daily as needed for headache. [provider]  Active Self, Pharmacy Records  acidophilus [facility]) CAPS capsule [id number] Yes Take 2 capsules by mouth daily. Vann, Jessica U, DO  Active Self, Pharmacy Records  B Complex-C-Folic Acid  (RENA-VITE PO) [id number] Yes Take 1 tablet by mouth every morning. [provider]  Active Self, Pharmacy Records  buPROPion  (WELLBUTRIN  XL) 300 MG 24 hr tablet [id number] Yes Take 1  tablet (300 mg total) by mouth in the morning. Plotnikov, Aleksei V, MD  Active Self, Pharmacy Records  carboxymethylcellulose (REFRESH PLUS) 0.5 % SOLN [id number] Yes Place 1 drop into both eyes 3 (three) times daily as needed (dry eyes/irritation). [provider]  Active Self, Pharmacy Records  cinacalcet  (SENSIPAR ) 30 MG tablet [id number] Yes Take 30 mg by mouth Every Tuesday,Thursday,and Saturday with dialysis. [provider]  Active Self, Pharmacy Records  cyclobenzaprine  (FLEXERIL ) 10 MG tablet [id number] Yes Take 1 tablet (10 mg total) by mouth 3 (three) times daily as needed for muscle spasms. Plotnikov, Aleksei V, MD  Active Self, Pharmacy Records  diphenhydramine -acetaminophen  (TYLENOL  PM) 25-500 MG TABS tablet [id number] Yes Take 2 tablets by mouth 3 (three) times a week. At bedtime [provider]  Active Self, Pharmacy Records  docusate sodium  (COLACE) 50 MG capsule [id number] Yes Take 50-100 mg by mouth 2 (two) times daily. 1 qam, 2 qpm [provider]  Active Self, Pharmacy Records  FLUoxetine  (PROZAC ) 40 MG capsule [id number] Yes Take 1 capsule (40 mg total) by mouth in the morning.   Active Self, Pharmacy Records  fluticasone  (FLONASE ) 50 MCG/ACT nasal spray [id number] Yes Place 2 sprays into both nostrils daily.  Patient taking differently: Place 2 sprays into both nostrils daily. Patient takes as needed   Soldatova, Liuba, MD  Active Self, Pharmacy Records  glimepiride  (AMARYL ) 1 MG tablet [id number] Yes Take 1 tablet (1 mg total) by mouth daily with breakfast. Plotnikov, Aleksei V, MD  Active Self, Pharmacy Records  ketoconazole  (NIZORAL ) 2 % cream [id number] Yes Apply 1 Application topically daily. Plotnikov, Karlynn GAILS, MD  Active Self, Pharmacy Records  lanthanum [facility]) 1000 MG chewable tablet [id number] Yes  Chew 1,000-2,000 mg by mouth See admin instructions. Taking 2000 mg with meals and 1000 mg  with snacks [provider]  Active Self,  Pharmacy Records  lidocaine  (LIDODERM ) 5 % [id number] Yes Place 1 patch onto the skin as needed. Remove & Discard patch within 12 hours or as directed by MD [provider]  Active Self, Pharmacy Records  LORazepam  (ATIVAN ) 2 MG tablet [id number] Yes Take 1 tablet (2 mg total) by mouth 2 (two) times daily as needed for anxiety.   Active Self, Pharmacy Records  midodrine  (PROAMATINE ) 10 MG tablet [id number] Yes Take 1 tablet (10 mg total) by mouth in the morning, at noon, and at bedtime. Vann, Jessica U, DO  Active Self, Pharmacy Records  Multiple Vitamins-Minerals (PRESERVISION AREDS 2) CAPS [id number] Yes Take 1 capsule by mouth in the morning and at bedtime. [provider]  Active Self, Pharmacy Records  naloxone  (NARCAN ) nasal spray 4 mg/0.1 mL [id number] Yes Take by nasal route every 3 minutes until patient awakes or EMS arrives.   Active Self, Pharmacy Records           Med Note STEFFI ADELITA [place] Nov 07, 2024 11:34 AM)    Nutritional Supplements (FEEDING SUPPLEMENT, NEPRO CARB STEADY,) LIQD [id number] Yes Take 237 mLs by mouth as needed (missed meal during dialysis.). Al-Sultani, Anmar, MD  Active   oxyCODONE  (ROXICODONE ) 15 MG immediate release tablet [id number] Yes Take 1 tablet (15 mg total) by mouth 4 (four) times daily as needed.   Active Self, Pharmacy Records  pantoprazole  (PROTONIX ) 40 MG tablet [id number] Yes TAKE 1 TABLET BY MOUTH TWICE  DAILY Abran Norleen SAILOR, MD  Active Self, Pharmacy Records  pilocarpine  (SALAGEN ) 5 MG tablet [id number] Yes TAKE 1 TABLET BY MOUTH TWICE  DAILY Plotnikov, Aleksei V, MD  Active Self, Pharmacy Records  pioglitazone  (ACTOS ) 15 MG tablet [id number] Yes Take 1 tablet (15 mg total) by mouth daily. Plotnikov, Aleksei V, MD  Active Self, Pharmacy Records  polyethylene glycol (MIRALAX  / GLYCOLAX ) 17 g packet [id number] Yes Take 17 g by mouth daily as needed for moderate constipation. [provider]  Active Self, Pharmacy Records            Med Note KERRIN, MELISSA R   Thu Feb 02, 2024  2:01 PM)    PREVIDENT 5000 DRY MOUTH 1.1 % GEL dental gel [id number] Yes Place 1 application  onto teeth in the morning and at bedtime. [provider]  Active Self, Pharmacy Records  REPATHA  SURECLICK 140 MG/ML EMMANUEL [id number] Yes INJECT 1 PEN SUBCUTANEOUSLY  EVERY 2 WEEKS Croitoru, Mihai, MD  Active Self, Pharmacy Records           Med Note EFRAIM ALFREIDA LITTIE [place] Nov 29, 2024  8:36 PM) Patient is unsure if was supposed to take it last week. Needed this Sunday 12/02/24.  Semaglutide ,0.25 or 0.5MG /DOS, (OZEMPIC , 0.25 OR 0.5 MG/DOSE,) 2 MG/3ML SOPN [id number]  INJECT SUBCUTANEOUSLY 0.5 MG  EVERY WEEK  Patient not taking: Reported on 12/04/2024   Plotnikov, Aleksei V, MD  Active Self, Pharmacy Records           Med Note EFRAIM ALFREIDA LITTIE [place] Nov 29, 2024  8:37 PM) Patient is unsure if she is supposed to resume this medication.  Med List Note Mylo Powell LITTIE, CPhT 09/14/21 1722): Dialysis Tuesday, Thursday, Saturday - [facility].            Home Care and Equipment/Supplies: Were  Home Health Services Ordered?: No Any new equipment or medical supplies ordered?: No  Functional Questionnaire: Do you need assistance with bathing/showering or dressing?: No Do you need assistance with meal preparation?: No Do you need assistance with eating?: No Do you have difficulty maintaining continence: Yes Do you need assistance with getting out of bed/getting out of a chair/moving?: No Do you have difficulty managing or taking your medications?: No  Follow up appointments reviewed: PCP Follow-up appointment confirmed?: Yes Date of PCP follow-up appointment?: 12/17/24 Follow-up Provider: conference call with patient to PCP office to schedule Specialist Hospital Follow-up appointment confirmed?: Yes Date of Specialist follow-up appointment?: 12/11/24 Follow-Up Specialty Provider:: Ellouise Console [facility] 12/30 Do you need transportation  to your follow-up appointment?: No Do you understand care options if your condition(s) worsen?: Yes-patient verbalized understanding  SDOH Interventions Today    Flowsheet Row Most Recent Value  SDOH Interventions   Food Insecurity Interventions Intervention Not Indicated  Housing Interventions Intervention Not Indicated  Transportation Interventions Intervention Not Indicated  Utilities Interventions Intervention Not Indicated    Goals Addressed             This Visit's Progress    VBCI Transitions of Care (TOC) Care Plan       Problems:  Prior Hospitalization for treatment of Septic shock due to right sided colitis. Patient readmitted to hospital Admit/Discharge Date: 12/6 - [name] Primary Diagnosis: Colitis with rectal bleeding   and was readmitted - NEW HOSPITALIZATION 12/18 - 12/03/24 [place]  Colitis w/rectal bleeding - colonoscopy 12/01/24  Goal:  Over the next 30 days, the patient will not experience hospital readmission  Interventions:  Transitions of Care: Doctor Visits  - discussed the importance of doctor visits Reviewed Signs and symptoms of infection Reviewed Dialysis Days - Tu, Th, Sat - patient states no urine output  TOC RN scheduled patient's hospital f/u with PCP via conference call to office - appt scheduled for 12/17/24 1:20pm Conference call with patient to [facility] on [address] at [phone] re: patient reporting issue getting Lorazepam  - spoke with pharmacist, Signe who was able to fill and advised patient it will be ready in 2-3 hours    Patient Self Care Activities:  Attend all scheduled provider appointments Call pharmacy for medication refills 3-7 days in advance of running out of medications Call provider office for new concerns or questions  Notify RN Care Manager of [facility] call rescheduling needs Participate in Transition of Care Program/Attend TOC scheduled calls Take medications as prescribed   Follow up appt with Ellouise Console at [facility]  12/11/24 Notify provider with return of diarrhea or abdominal pain, or fever  Notify provider with any falls Keep hospital follow up appt with PCP 12/17/24 Monitor for any rectal bleeding and notify provider    Plan:  Telephone follow up appointment with care management team member scheduled for:  12/14/24 in the afternoon  The patient has been provided with contact information for the care management team and has been advised to call with any health related questions or concerns.          Shona Prow RN, CCM [place]  VBCI-Population Health RN Care Manager [phone]    "

## 2024-12-07 ENCOUNTER — Ambulatory Visit: Admitting: Internal Medicine

## 2024-12-10 NOTE — Progress Notes (Unsigned)
 "     Anita Console, PA-C [address] [place], [place]  [address] Phone: [phone]   Primary Care Physician: Garald Karlynn GAILS, MD  Primary Gastroenterologist:  Anita Console, PA-C / Norleen Kiang, MD   Chief Complaint: Hospital follow-up rectal bleeding and ischemic colitis.   HPI:   Discussed the use of AI scribe software for clinical note transcription with the patient, who gave verbal consent to proceed.  73 year old female, established patient Dr. Kiang, presents for hospital follow-up of rectal bleeding.  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  Medical history significant for ESRD on TTS hemodialysis, type 2 diabetes, chronic hypotension, anemia of chronic disease, depression/anxiety, chronic pain, and history of recurrent ischemic colitis.  Initial hemoglobin 8.8, trended down to 7.2.  Received 1 unit PRBC.  Hemoglobin 8.6 at discharge.  She is on midodrine  for hypotension.  Followed by nephrology for ESRD.  12/01/2024 colonoscopy by Dr. Rollin in hospital: Multiple ulcers in the proximal ascending colon.  Spurting bleeding was present.  Ulcers were injected and 3 clips were placed to stop active bleeding.  Hemostatic spray applied.  Sigmoid diverticulosis.  No biopsies.  In the ascending colon, just proximal to the cecal cap, was a 75% circumferential superficial ischemic appearing ulceration with adherent clot.  Injected with epinephrine .  Marked clips placed for active bleeding.  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  Sevelamar is a phosphate binder.  She has been avoiding Sevelamar binder since then.  She hasI history of GERD, ischemic colitis, microscopic colitis, fatty liver disease, IBS-D, diabetic peripheral neuropathy, type 2 diabetes, ESRD, multiple medical problems.  History of Present Illness Rectal bleeding and bowel habits - Hospitalized last week  for rectal bleeding due to ischemic colitis, with hemoglobin of 7.2 g/dL, requiring transfusion of one unit of blood. - Hemoglobin improved to 8.6 g/dL prior to discharge. - Experienced two episodes of rectal bleeding on the day of discharge; no further episodes since. - Has had two regular bowel movements since discharge without visible blood. - No current constipation or further bleeding. - Took one dose of Miralax  since discharge, resulting in mushy stools. - Occasional difficulty distinguishing between gas and soft stool due to decreased anal sphincter tone from previously thrombosed hemorrhoid. - Phosphorus binders can cause hard stools, but current bowel movements are soft. - Does not have daily bowel movements, attributed to decreased oral intake.  Depression and psychosocial impact - Sees a counselor monthly for depression. - Expresses frustration and sadness regarding loss of independence, inability to travel, and impact of dialysis on quality of life. - Maintains contact with friends but is limited in activities due to medical conditions.  Social history: She is a retired hospital endoscopy nurse.  Current Outpatient Medications  Medication Sig Dispense Refill   Accu-Chek Softclix Lancets lancets Use 1 lancet up to 4 times daily as directed 100 each 0   acetaminophen  (TYLENOL ) 500 MG tablet Take 1,000 mg by mouth 2 (two) times daily as needed for headache.     acidophilus (RISAQUAD) CAPS capsule Take 2 capsules by mouth daily.     B Complex-C-Folic Acid  (RENA-VITE PO) Take 1 tablet by mouth every morning.     buPROPion  (WELLBUTRIN  XL) 300 MG 24 hr tablet Take 1 tablet (300 mg total) by mouth in the morning. 90 tablet 1   carboxymethylcellulose (REFRESH PLUS) 0.5 % SOLN Place 1 drop into both eyes 3 (  three) times daily as needed (dry eyes/irritation).     cinacalcet  (SENSIPAR ) 30 MG tablet Take 30 mg by mouth Every Tuesday,Thursday,and Saturday with dialysis.     cyclobenzaprine   (FLEXERIL ) 10 MG tablet Take 1 tablet (10 mg total) by mouth 3 (three) times daily as needed for muscle spasms. 270 tablet 0   diphenhydramine -acetaminophen  (TYLENOL  PM) 25-500 MG TABS tablet Take 2 tablets by mouth 3 (three) times a week. At bedtime     docusate sodium  (COLACE) 50 MG capsule Take 50-100 mg by mouth 2 (two) times daily. 1 qam, 2 qpm     FLUoxetine  (PROZAC ) 40 MG capsule Take 1 capsule (40 mg total) by mouth in the morning. 90 capsule 1   fluticasone  (FLONASE ) 50 MCG/ACT nasal spray Place 2 sprays into both nostrils daily. (Patient taking differently: Place 2 sprays into both nostrils daily. Patient takes as needed) 16 g 6   glimepiride  (AMARYL ) 1 MG tablet Take 1 tablet (1 mg total) by mouth daily with breakfast. 100 tablet 2   ketoconazole  (NIZORAL ) 2 % cream Apply 1 Application topically daily. 60 g 3   lanthanum (FOSRENOL) 1000 MG chewable tablet Chew 1,000-2,000 mg by mouth See admin instructions. Taking 2000 mg with meals and 1000 mg  with snacks     lidocaine  (LIDODERM ) 5 % Place 1 patch onto the skin as needed. Remove & Discard patch within 12 hours or as directed by MD     LORazepam  (ATIVAN ) 2 MG tablet Take 1 tablet (2 mg total) by mouth 2 (two) times daily as needed for anxiety. 60 tablet 5   midodrine  (PROAMATINE ) 10 MG tablet Take 1 tablet (10 mg total) by mouth in the morning, at noon, and at bedtime.     Multiple Vitamins-Minerals (PRESERVISION AREDS 2) CAPS Take 1 capsule by mouth in the morning and at bedtime.     naloxone  (NARCAN ) nasal spray 4 mg/0.1 mL Take by nasal route every 3 minutes until patient awakes or EMS arrives. 2 each 0   Nutritional Supplements (FEEDING SUPPLEMENT, NEPRO CARB STEADY,) LIQD Take 237 mLs by mouth as needed (missed meal during dialysis.).     oxyCODONE  (ROXICODONE ) 15 MG immediate release tablet Take 1 tablet (15 mg total) by mouth 4 (four) times daily as needed. 120 tablet 0   pantoprazole  (PROTONIX ) 40 MG tablet TAKE 1 TABLET BY MOUTH  TWICE  DAILY 200 tablet 2   pilocarpine  (SALAGEN ) 5 MG tablet TAKE 1 TABLET BY MOUTH TWICE  DAILY 60 tablet 11   pioglitazone  (ACTOS ) 15 MG tablet Take 1 tablet (15 mg total) by mouth daily. 100 tablet 2   polyethylene glycol (MIRALAX  / GLYCOLAX ) 17 g packet Take 17 g by mouth daily as needed for moderate constipation.     PREVIDENT 5000 DRY MOUTH 1.1 % GEL dental gel Place 1 application  onto teeth in the morning and at bedtime.     REPATHA  SURECLICK 140 MG/ML SOAJ INJECT 1 PEN SUBCUTANEOUSLY  EVERY 2 WEEKS 6 mL 3   [Paused] Semaglutide ,0.25 or 0.5MG /DOS, (OZEMPIC , 0.25 OR 0.5 MG/DOSE,) 2 MG/3ML SOPN INJECT SUBCUTANEOUSLY 0.5 MG  EVERY WEEK 3 mL 1   No current facility-administered medications for this visit.    Allergies as of 12/11/2024 - Review Complete 12/11/2024  Allergen Reaction Noted   Erythromycin  Diarrhea and Nausea And Vomiting    Gabapentin Other (See Comments) 09/05/2020   Lyrica [pregabalin] Other (See Comments) 11/29/2024   Other Itching and Other (See Comments) 11/29/2024  Past Medical History:  Diagnosis Date   Anemia    Anxiety    Brain tumor (benign) ([name])    Colitis 2010   microscopic- Dr Abran   Depression    Diabetes mellitus    type II   Diverticulosis    Dyspnea    with exertion   ESRD (end stage renal disease) ([name])    TTUSAT [address]    Fibromyalgia    GERD (gastroesophageal reflux disease)    Headache    Hemodialysis patient    History of blood transfusion    after knee surgery   Hypertension    discontinued all diuretics and antihypertensives   IBS (irritable bowel syndrome)    LBP (low back pain)    Neuropathy    feet bilat    Osteoarthritis    Osteopenia    Pneumonia    hx of 2014    Rotator cuff tear, right    Sinusitis    currently being treated with antibiotic will complete 03/04/2015    Past Surgical History:  Procedure Laterality Date   A/V FISTULAGRAM N/A 02/03/2024   Procedure: A/V Fistulagram;  Surgeon: Norine Manuelita LABOR, MD;  Location: [facility];  Service: Cardiovascular;  Laterality: N/A;   A/V SHUNT INTERVENTION Right 05/30/2024   Procedure: A/V SHUNT INTERVENTION;  Surgeon: Pearline Norman RAMAN, MD;  Location: [facility];  Service: Cardiovascular;  Laterality: Right;   A/V SHUNT INTERVENTION N/A 09/07/2024   Procedure: A/V SHUNT INTERVENTION;  Surgeon: Lanis Fonda BRAVO, MD;  Location: [facility];  Service: Cardiovascular;  Laterality: N/A;   AV FISTULA PLACEMENT Right 09/12/2020   Procedure: RIGHT ARTERIOVENOUS (AV) FISTULA CREATION;  Surgeon: Harvey Carlin BRAVO, MD;  Location: [facility] OR;  Service: Vascular;  Laterality: Right;   BASCILIC VEIN TRANSPOSITION Right 10/27/2020   Procedure: RIGHT UPPER EXTREMITY SECOND STAGE BASCILIC VEIN TRANSPOSITION;  Surgeon: Harvey Carlin BRAVO, MD;  Location: [facility] OR;  Service: Vascular;  Laterality: Right;   BIOPSY  03/11/2022   Procedure: BIOPSY;  Surgeon: Wilhelmenia Aloha Raddle., MD;  Location: [facility] ENDOSCOPY;  Service: Gastroenterology;;   COLONOSCOPY N/A 12/01/2024   Procedure: COLONOSCOPY;  Surgeon: Rollin Dover, MD;  Location: [facility] ENDOSCOPY;  Service: Gastroenterology;  Laterality: N/A;   COLONOSCOPY WITH PROPOFOL  N/A 03/11/2022   Procedure: COLONOSCOPY WITH PROPOFOL ;  Surgeon: Mansouraty, Aloha Raddle., MD;  Location: [facility] ENDOSCOPY;  Service: Gastroenterology;  Laterality: N/A;   foramen magnum ependymoma surgery  2003   Dr Mora   HEMOSTASIS CLIP PLACEMENT  12/01/2024   Procedure: CONTROL OF HEMORRHAGE, GI TRACT, ENDOSCOPIC, BY CLIPPING OR OVERSEWING;  Surgeon: Rollin Dover, MD;  Location: [facility] ENDOSCOPY;  Service: Gastroenterology;;   JOINT REPLACEMENT Bilateral    NASAL SINUS SURGERY     1973    PERIPHERAL VASCULAR BALLOON ANGIOPLASTY  02/03/2024   Procedure: PERIPHERAL VASCULAR BALLOON ANGIOPLASTY;  Surgeon: Norine Manuelita LABOR, MD;  Location: [facility];  Service: Cardiovascular;;  Inflow Basilic Vein   REVERSE SHOULDER ARTHROPLASTY Right 06/03/2022    Procedure: REVERSE SHOULDER ARTHROPLASTY;  Surgeon: Melita Drivers, MD;  Location: [facility] ORS;  Service: Orthopedics;  Laterality: Right;    SCLEROTHERAPY  12/01/2024   Procedure: SCLEROTHERAPY;  Surgeon: Rollin Dover, MD;  Location[facility] ENDOSCOPY;  Service: Gastroenterology;;   TONSILLECTOMY     TOTAL KNEE ARTHROPLASTY     L 2008, R 2009, R 2016- Dr hiram   TOTAL KNEE REVISION Right 03/05/2015   Procedure: RIGHT TOTAL KNEE ARTHROPLASTY REVISION;  Surgeon: Dempsey Moan, MD;  Location: [facility] ORS;  Service: Orthopedics;  Laterality: Right;   VENOUS ANGIOPLASTY  05/30/2024   Procedure: VENOUS ANGIOPLASTY;  Surgeon: Pearline Norman RAMAN, MD;  Location: [facility];  Service: Cardiovascular;;  central   VENOUS ANGIOPLASTY  09/07/2024   Procedure: VENOUS ANGIOPLASTY;  Surgeon: Lanis Fonda BRAVO, MD;  Location: [facility];  Service: Cardiovascular;;  innominate 70%    Review of Systems:    All systems reviewed and negative except where noted in HPI.    Physical Exam:  BP 122/64   Pulse 86   Ht 5' 0.5 (1.537 m)   Wt 184 lb (83.5 kg)   BMI 35.34 kg/m  No LMP recorded. Patient is postmenopausal.  General: Well-nourished, well-developed in no acute distress.  Neuro: Alert and oriented x 3.  Grossly intact.  Psych: Alert and cooperative, normal mood and affect.   Imaging Studies: CT Angio Abd/Pel W and/or Wo Contrast Result Date: 11/29/2024 CLINICAL DATA:  Chronic mesenteric ischemia.  Rectal bleeding. EXAM: CTA ABDOMEN AND PELVIS WITHOUT AND WITH CONTRAST TECHNIQUE: Multidetector CT imaging of the abdomen and pelvis was performed using the standard protocol during bolus administration of intravenous contrast. Multiplanar reconstructed images and MIPs were obtained and reviewed to evaluate the vascular anatomy. RADIATION DOSE REDUCTION: This exam was performed according to the departmental dose-optimization program which includes automated exposure control, adjustment of the mA and/or kV according to  patient size and/or use of iterative reconstruction technique. CONTRAST:  85mL OMNIPAQUE  IOHEXOL  350 MG/ML SOLN COMPARISON:  Twelve days ago FINDINGS: VASCULAR Aorta: Normal caliber aorta without aneurysm, dissection, vasculitis or significant stenosis. Aortic atherosclerosis. Celiac: Patent without evidence of aneurysm, dissection, vasculitis or significant stenosis. SMA: Patent without evidence of aneurysm, dissection, vasculitis or significant stenosis. Renals: Both renal arteries are patent without evidence of aneurysm or dissection, although they are diminutive in caliber consistent with history of end-stage renal disease. IMA: Patent without evidence of aneurysm, dissection, vasculitis or significant stenosis. Inflow: Patent without evidence of aneurysm, dissection, vasculitis or significant stenosis. Proximal Outflow: Bilateral common femoral and visualized portions of the superficial and profunda femoral arteries are patent without evidence of aneurysm, dissection, vasculitis or significant stenosis. Veins: No obvious venous abnormality within the limitations of this arterial phase study. Review of the MIP images confirms the above findings. NON-VASCULAR Lower chest: No acute abnormality. Hepatobiliary: No focal liver abnormality is seen. No gallstones, gallbladder wall thickening, or biliary dilatation. Pancreas: Unremarkable. No pancreatic ductal dilatation or surrounding inflammatory changes. Spleen: Normal in size without focal abnormality. Adrenals/Urinary Tract: Adrenal glands appear normal. Bilateral renal atrophy is noted with multiple cyst formation consistent history of end-stage renal disease. Urinary bladder is decompressed. Stomach/Bowel: Stomach is unremarkable. There is no evidence of bowel obstruction. Large amount of residual contrast is noted throughout the colon, preventing evaluation for active gastrointestinal bleeding. Persistent moderate narrowing is seen involving the cecum which may  represent focal inflammation or possibly colonic malignancy. Colonoscopy is recommended. Lymphatic: Mildly enlarged mesenteric lymph nodes are noted in the right lower quadrant which most likely are inflammatory or reactive in etiology, but metastatic disease cannot be excluded. Reproductive: Uterus and bilateral adnexa are unremarkable. Other: Moderate size fat containing periumbilical hernia. No ascites. Musculoskeletal: No acute or significant osseous findings. IMPRESSION: 1. Persistent moderate narrowing is seen involving the cecum which may represent focal inflammation or possibly colonic malignancy. Colonoscopy is recommended. Mildly enlarged mesenteric lymph nodes are noted in the right lower quadrant which most likely are  inflammatory or reactive in etiology, but metastatic disease cannot be excluded. 2. Large amount of residual contrast is noted throughout the colon, preventing evaluation for active gastrointestinal bleeding. 3.    Moderate size fat containing periumbilical hernia. Aortic Atherosclerosis (ICD10-I70.0). Electronically Signed   By: Lynwood Landy Raddle M.D.   On: 11/29/2024 16:10   CT Angio Abd/Pel W and/or Wo Contrast Result Date: 11/17/2024 CLINICAL DATA:  Lower GI bleed.  Diarrhea with bright red blood. EXAM: CTA ABDOMEN AND PELVIS WITHOUT AND WITH CONTRAST TECHNIQUE: Multidetector CT imaging of the abdomen and pelvis was performed using the standard protocol during bolus administration of intravenous contrast. Multiplanar reconstructed images and MIPs were obtained and reviewed to evaluate the vascular anatomy. RADIATION DOSE REDUCTION: This exam was performed according to the departmental dose-optimization program which includes automated exposure control, adjustment of the mA and/or kV according to patient size and/or use of iterative reconstruction technique. CONTRAST:  100mL OMNIPAQUE  IOHEXOL  350 MG/ML SOLN COMPARISON:  11/08/2024. FINDINGS: VASCULAR Aorta: Normal caliber aorta without  aneurysm, dissection, vasculitis or significant stenosis. Aortic atherosclerosis. Celiac: Patent without evidence of aneurysm, dissection, vasculitis or significant stenosis. SMA: Patent without evidence of aneurysm, dissection, vasculitis or significant stenosis. Renals: Both renal arteries are patent without evidence of aneurysm or dissection. There is moderate renal artery stenosis on the right. The left renal artery is patent. IMA: Patent without evidence of aneurysm, dissection, vasculitis or significant stenosis. Inflow: Patent without evidence of aneurysm, dissection, vasculitis or significant stenosis. Proximal Outflow: Bilateral common femoral and visualized portions of the superficial and profunda femoral arteries are patent without evidence of aneurysm, dissection, vasculitis or significant stenosis. Veins: No obvious venous abnormality within the limitations of this arterial phase study. Review of the MIP images confirms the above findings. NON-VASCULAR Lower chest: There is a 4 mm ground-glass nodule in the right lower lobe, axial image 34. There is a 5 mm ground-glass nodule in the right lower lobe, axial image 51. Minimal atelectasis or scarring is noted at the lung bases. Hepatobiliary: No focal liver abnormality is seen. No gallstones, gallbladder wall thickening, or biliary dilatation. Pancreas: Unremarkable. No pancreatic ductal dilatation or surrounding inflammatory changes. Spleen: Normal in size without focal abnormality. Adrenals/Urinary Tract: The adrenal glands are within normal limits. Renal atrophy is noted bilaterally. There are multiple renal cysts bilaterally. No renal calculus or hydronephrosis. The bladder is unremarkable. Stomach/Bowel: The stomach is within normal limits. No bowel obstruction, free air, or pneumatosis is seen. Evaluation for hemorrhage is limited due to the presence of oral contrast. A few scattered diverticula are noted along the colon without evidence of  diverticulitis. There is persistent circumferential wall thickening involving the ascending colon with mild associated fat stranding. Appendix is not seen. Lymphatic: Enlarged lymph nodes are noted in the right lower quadrant adjacent to the region of colonic wall thickening. Reproductive: Uterine fibroids are noted.  No adnexal mass is seen. Other: No abdominopelvic ascites. Large fat containing umbilical hernia is present. Musculoskeletal: Degenerative changes are noted in the thoracolumbar spine. No acute osseous abnormality is identified. IMPRESSION: VASCULAR 1. No evidence of active hemorrhage, however evaluation of the bowel is limited due to the presence of enteric contrast. 2. Aortic atherosclerosis. NON-VASCULAR 1. No definite evidence of acute or active hemorrhage. Evaluation is limited due to the presence of oral contrast. 2. Persistent circumferential colonic wall thickening involving the ascending colon with associated inflammation and lymphadenopathy, which may be infectious, inflammatory, or neoplastic. Correlation with colonoscopy is recommended. 3. Diverticulosis without  diverticulitis. 4. Right lower lobe ground-glass nodules measuring up to 5 mm. Follow-up by chest CT without contrast is recommended in 2 years, and again at 4 years to confirm stability. This recommendation follows the consensus statement: Recommendations for the Management of Subsolid Pulmonary Nodules Detected at CT: A Statement from the Fleischner Society as published in Radiology 2013; 266:304-317. 5. Remaining incidental findings as described above. Electronically Signed   By: Leita Birmingham M.D.   On: 11/17/2024 14:38    Labs: CBC    Component Value Date/Time   WBC 7.8 12/11/2024 1421   RBC 3.03 (L) 12/11/2024 1421   HGB 9.5 (L) 12/11/2024 1421   HCT 29.5 (L) 12/11/2024 1421   PLT 329.0 12/11/2024 1421   MCV 97.4 12/11/2024 1421   MCH 30.6 12/03/2024 0310   MCHC 32.2 12/11/2024 1421   RDW 17.4 (H) 12/11/2024  1421   LYMPHSABS 1.6 12/11/2024 1421   MONOABS 0.8 12/11/2024 1421   EOSABS 0.4 12/11/2024 1421   BASOSABS 0.1 12/11/2024 1421    CMP     Component Value Date/Time   NA 131 (L) 12/03/2024 0310   K 3.7 12/03/2024 0310   CL 90 (L) 12/03/2024 0310   CO2 26 12/03/2024 0310   GLUCOSE 62 (L) 12/03/2024 0310   BUN 21 12/03/2024 0310   CREATININE 4.00 (H) 12/03/2024 0310   CALCIUM 8.4 (L) 12/03/2024 0310   PROT 5.7 (L) 12/02/2024 1454   ALBUMIN  3.1 (L) 12/02/2024 1454   AST 54 (H) 12/02/2024 1454   ALT 20 12/02/2024 1454   ALKPHOS 78 12/02/2024 1454   BILITOT 1.1 12/02/2024 1454   GFRNONAA 11 (L) 12/03/2024 0310   GFRAA 66 (L) 03/07/2015 0449       Assessment and Plan:   [name] is a 73 y.o. y/o female   1.  Rectal bleeding 2.  Acute ischemic colitis 3.  Acute blood loss anemia 4.  ESRD on hemodialysis 5.  Anemia of chronic disease  Plan: - Labs: CBC, iron panel, ferritin, B12, folate. Assessment & Plan Rectal bleeding due to ischemic colitis exacerbated by dialysis. Two episodes post-hospitalization resolved without recurrence, indicating resolution of acute event. - Ordered blood work for follow-up. - Instructed her to report any recurrent rectal bleeding.  End stage renal disease on hemodialysis Maintained on hemodialysis since October 2021. Adheres to phosphorus binder therapy, dietary, and fluid restrictions. Quality of life affected by neuropathy, orthopedic pain, and activity limitations.  Anemia in chronic kidney disease Acute drop in hemoglobin during hospitalization required transfusion. Hemoglobin improved but remains below target for dialysis patients. Ongoing monitoring required to maintain hemoglobin closer to 10 g/dL. - Ordered blood work to monitor hemoglobin.  Constipation Intermittent constipation likely due to phosphorus binders and dietary factors, with altered anorectal tone and history of thrombosed hemorrhoid. Titrates laxative dosing to  avoid diarrhea. Cannot use Metamucil due to renal dietary restrictions. - Recommended titrating Miralax  dose (1 or 2 tablespoons daily) and adjust dose based on bowel habits, starting with a low dose and increasing as needed. - Recommended adding Benefiber (one tablespoon) with Miralax  in the same drink, and to confirm with her nephrologist. - Provided education on the mechanism of Miralax  and Benefiber and emphasized consistency in dosing.   Anita Console, PA-C  Follow up based on lab results and GI symptoms.   "

## 2024-12-11 ENCOUNTER — Encounter: Payer: Self-pay | Admitting: Physician Assistant

## 2024-12-11 ENCOUNTER — Other Ambulatory Visit (INDEPENDENT_AMBULATORY_CARE_PROVIDER_SITE_OTHER)

## 2024-12-11 ENCOUNTER — Ambulatory Visit (INDEPENDENT_AMBULATORY_CARE_PROVIDER_SITE_OTHER): Admitting: Physician Assistant

## 2024-12-11 VITALS — BP 122/64 | HR 86 | Ht 60.5 in | Wt 184.0 lb

## 2024-12-11 DIAGNOSIS — D649 Anemia, unspecified: Secondary | ICD-10-CM | POA: Diagnosis not present

## 2024-12-11 DIAGNOSIS — K625 Hemorrhage of anus and rectum: Secondary | ICD-10-CM | POA: Diagnosis not present

## 2024-12-11 DIAGNOSIS — N186 End stage renal disease: Secondary | ICD-10-CM

## 2024-12-11 DIAGNOSIS — K559 Vascular disorder of intestine, unspecified: Secondary | ICD-10-CM | POA: Diagnosis not present

## 2024-12-11 DIAGNOSIS — D62 Acute posthemorrhagic anemia: Secondary | ICD-10-CM

## 2024-12-11 DIAGNOSIS — Z992 Dependence on renal dialysis: Secondary | ICD-10-CM | POA: Diagnosis not present

## 2024-12-11 DIAGNOSIS — D638 Anemia in other chronic diseases classified elsewhere: Secondary | ICD-10-CM | POA: Diagnosis not present

## 2024-12-11 DIAGNOSIS — K5900 Constipation, unspecified: Secondary | ICD-10-CM

## 2024-12-11 LAB — CBC WITH DIFFERENTIAL/PLATELET
Basophils Absolute: 0.1 K/uL (ref 0.0–0.1)
Basophils Relative: 1.1 % (ref 0.0–3.0)
Eosinophils Absolute: 0.4 K/uL (ref 0.0–0.7)
Eosinophils Relative: 5.1 % — ABNORMAL HIGH (ref 0.0–5.0)
HCT: 29.5 % — ABNORMAL LOW (ref 36.0–46.0)
Hemoglobin: 9.5 g/dL — ABNORMAL LOW (ref 12.0–15.0)
Lymphocytes Relative: 20.9 % (ref 12.0–46.0)
Lymphs Abs: 1.6 K/uL (ref 0.7–4.0)
MCHC: 32.2 g/dL (ref 30.0–36.0)
MCV: 97.4 fl (ref 78.0–100.0)
Monocytes Absolute: 0.8 K/uL (ref 0.1–1.0)
Monocytes Relative: 9.7 % (ref 3.0–12.0)
Neutro Abs: 4.9 K/uL (ref 1.4–7.7)
Neutrophils Relative %: 63.2 % (ref 43.0–77.0)
Platelets: 329 K/uL (ref 150.0–400.0)
RBC: 3.03 Mil/uL — ABNORMAL LOW (ref 3.87–5.11)
RDW: 17.4 % — ABNORMAL HIGH (ref 11.5–15.5)
WBC: 7.8 K/uL (ref 4.0–10.5)

## 2024-12-11 LAB — B12 AND FOLATE PANEL
Folate: >23.7 ng/mL
Vitamin B-12: 630 pg/mL (ref 211–911)

## 2024-12-11 NOTE — Patient Instructions (Addendum)
 Your provider has requested that you go to the basement level for lab work before leaving today. Press B on the elevator. The lab is located at the first door on the left as you exit the elevator.  Start OTC Benefiber Powder. Mix 1 - 2 Tablespoons in 6 - 8 ounces of a Drink Once Daily. Drink 64 ounces of water  / fluids Daily.    For constipation: Take OTC Miralax  Powder Mix 1 - 2 Tablespoons in 6 to 8 ounces of a drink once daily You can adjust dose based on bowel habits.  Please follow up sooner if symptoms increase or worsen  Due to recent changes in healthcare laws, you may see the results of your imaging and laboratory studies on MyChart before your provider has had a chance to review them.  We understand that in some cases there may be results that are confusing or concerning to you. Not all laboratory results come back in the same time frame and the provider may be waiting for multiple results in order to interpret others.  Please give us  48 hours in order for your provider to thoroughly review all the results before contacting the office for clarification of your results.   Thank you for trusting me with your gastrointestinal care!   Ellouise Console, PA-C _______________________________________________________  If your blood pressure at your visit was 140/90 or greater, please contact your primary care physician to follow up on this.  _______________________________________________________  If you are age 14 or older, your body mass index should be between 23-30. Your Body mass index is 35.34 kg/m. If this is out of the aforementioned range listed, please consider follow up with your Primary Care Provider.  If you are age 58 or younger, your body mass index should be between 19-25. Your Body mass index is 35.34 kg/m. If this is out of the aformentioned range listed, please consider follow up with your Primary Care Provider.    ________________________________________________________  The [facility] providers would like to encourage you to use MYCHART to communicate with providers for non-urgent requests or questions.  Due to long hold times on the telephone, sending your provider a message by [facility] may be a faster and more efficient way to get a response.  Please allow 48 business hours for a response.  Please remember that this is for non-urgent requests.  _______________________________________________________

## 2024-12-11 NOTE — Progress Notes (Signed)
 Noted

## 2024-12-12 LAB — IRON,TIBC AND FERRITIN PANEL
%SAT: 24 % (ref 16–45)
Ferritin: 1204 ng/mL — ABNORMAL HIGH (ref 16–288)
Iron: 44 ug/dL — ABNORMAL LOW (ref 45–160)
TIBC: 186 ug/dL — ABNORMAL LOW (ref 250–450)

## 2024-12-14 ENCOUNTER — Telehealth: Payer: Self-pay

## 2024-12-14 ENCOUNTER — Ambulatory Visit: Payer: Self-pay | Admitting: Nurse Practitioner

## 2024-12-14 NOTE — Transitions of Care (Post Inpatient/ED Visit) (Signed)
 " Transition of Care week 2  Visit Note  12/14/2024  Name: [name] MRN: [id number]          DOB: 1951/04/25  Situation: Patient enrolled in [facility] 30-day program. Visit completed with patient by telephone.   Background: Admit/Discharge Date:   12/18 - 12/22 [name]   Primary Diagnosis: Colitis with rectal bleeding w/colonoscopy 12/01/24   Initial Transition Care Management Follow-up Telephone Call Discharge Date and Diagnosis: 12/03/24, Colitis with rectal bleeding   Past Medical History:  Diagnosis Date   Anemia    Anxiety    Brain tumor (benign) ([name])    Colitis 2010   microscopic- Dr Abran   Depression    Diabetes mellitus    type II   Diverticulosis    Dyspnea    with exertion   ESRD (end stage renal disease) ([name])    TTUSAT [address]    Fibromyalgia    GERD (gastroesophageal reflux disease)    Headache    Hemodialysis patient    History of blood transfusion    after knee surgery   Hypertension    discontinued all diuretics and antihypertensives   IBS (irritable bowel syndrome)    LBP (low back pain)    Neuropathy    feet bilat    Osteoarthritis    Osteopenia    Pneumonia    hx of 2014    Rotator cuff tear, right    Sinusitis    currently being treated with antibiotic will complete 03/04/2015    Assessment: Patient Reported Symptoms: Cognitive Cognitive Status: No symptoms reported, Normal speech and language skills, Alert and oriented to person, place, and time      Neurological Neurological Review of Symptoms: Other:, Headaches Oher Neurological Symptoms/Conditions [RPT]: patient states neuropathy and headache is no better no worse - taking Tylenol  Neurological Management Strategies: Medication therapy Neurological Self-Management Outcome: 3 (uncertain)  HEENT HEENT Symptoms Reported: No symptoms reported HEENT Self-Management Outcome: 4 (good) HEENT Comment: patient states voice is a little scratchy today - is on 32 oz fluid restriction     Cardiovascular Cardiovascular Symptoms Reported: Swelling in legs or feet (Patient states feet are swollen because I sat with my feet down and read yesterday)    Respiratory Respiratory Symptoms Reported: No symptoms reported    Endocrine Endocrine Symptoms Reported: No symptoms reported Is patient diabetic?: Yes Is patient checking blood sugars at home?: Yes List most recent blood sugar readings, include date and time of day: Patient has not checked and is agreeable to check and make [facility] RN aware with next call Endocrine Self-Management Outcome: 3 (uncertain)  Gastrointestinal Gastrointestinal Symptoms Reported: No symptoms reported Other Gastrointestinal Symptoms: Patient states she has no further bleeding since discharge - saw GI 12/11/24 -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      Genitourinary Genitourinary Symptoms Reported: No symptoms reported Other Genitourinary Symptoms: no longer producing urine - on dialysis    Integumentary Integumentary Symptoms Reported: Other Other Integumentary Symptoms: Patient has right upper arm fistual - denies any issues    Musculoskeletal Musculoskelatal Symptoms Reviewed: Unsteady gait Additional Musculoskeletal Details: patient reports feet with neuropathy - some days better than others - states she did not tolerate gabapentin or lyrica Musculoskeletal Management Strategies: Adequate rest, Activity Musculoskeletal Self-Management Outcome: 3 (uncertain) Musculoskeletal Comment: uses walking stick Falls in the past year?: No Number of falls in past year:  (patient states she 'slid to the floor - actually sat down)  Psychosocial Psychosocial Symptoms Reported: Anxiety - if selected complete GAD, Depression - if selected complete PHQ 2-9 Additional Psychological Details: both mild         There were no vitals filed for this visit. Pain Scale: 0-10 Pain Score: 3  Pain Type: Neuropathic pain Pain Location:  Foot Pain Orientation: Right, Left Pain Descriptors / Indicators: Pins and needles Pain Onset: On-going Patients Stated Pain Goal: 1  Medications Reviewed Today     Reviewed by [name] LABOR, RN (Registered Nurse) on 12/14/24 at 1556  Med List Status: <None>   Medication Order Taking? Sig Documenting Provider Last Dose Status Informant  Accu-Chek Softclix Lancets lancets [id number] Yes Use 1 lancet up to 4 times daily as directed Samtani, Jai-Gurmukh, MD  Active Self, Pharmacy Records  acetaminophen  (TYLENOL ) 500 MG tablet [id number] Yes Take 1,000 mg by mouth 2 (two) times daily as needed for headache. [provider]  Active Self, Pharmacy Records  acidophilus [facility]) CAPS capsule [id number] Yes Take 2 capsules by mouth daily. Vann, Jessica U, DO  Active Self, Pharmacy Records  B Complex-C-Folic Acid  (RENA-VITE PO) [id number] Yes Take 1 tablet by mouth every morning. [provider]  Active Self, Pharmacy Records  buPROPion  (WELLBUTRIN  XL) 300 MG 24 hr tablet [id number] Yes Take 1 tablet (300 mg total) by mouth in the morning. Plotnikov, Aleksei V, MD  Active Self, Pharmacy Records  carboxymethylcellulose (REFRESH PLUS) 0.5 % SOLN [id number] Yes Place 1 drop into both eyes 3 (three) times daily as needed (dry eyes/irritation). [provider]  Active Self, Pharmacy Records  cinacalcet  (SENSIPAR ) 30 MG tablet [id number] Yes Take 30 mg by mouth Every Tuesday,Thursday,and Saturday with dialysis. [provider]  Active Self, Pharmacy Records  cyclobenzaprine  (FLEXERIL ) 10 MG tablet [id number] Yes Take 1 tablet (10 mg total) by mouth 3 (three) times daily as needed for muscle spasms. Plotnikov, Aleksei V, MD  Active Self, Pharmacy Records  diphenhydramine -acetaminophen  (TYLENOL  PM) 25-500 MG TABS tablet [id number] Yes Take 2 tablets by mouth 3 (three) times a week. At bedtime [provider]  Active Self, Pharmacy Records  docusate sodium  (COLACE) 50 MG  capsule [id number] Yes Take 50-100 mg by mouth 2 (two) times daily. 1 qam, 2 qpm [provider]  Active Self, Pharmacy Records  FLUoxetine  (PROZAC ) 40 MG capsule [id number] Yes Take 1 capsule (40 mg total) by mouth in the morning.   Active Self, Pharmacy Records  fluticasone  (FLONASE ) 50 MCG/ACT nasal spray [id number] Yes Place 2 sprays into both nostrils daily.  Patient taking differently: Place 2 sprays into both nostrils daily. Patient takes as needed   Soldatova, Liuba, MD  Active Self, Pharmacy Records  glimepiride  (AMARYL ) 1 MG tablet [id number] Yes Take 1 tablet (1 mg total) by mouth daily with breakfast. Plotnikov, Aleksei V, MD  Active Self, Pharmacy Records  ketoconazole  (NIZORAL ) 2 % cream [id number] Yes Apply 1 Application topically daily. Plotnikov, Aleksei V, MD  Active Self, Pharmacy Records  lanthanum [facility]) 1000 MG chewable tablet [id number] Yes Chew 1,000-2,000 mg by mouth See admin instructions. Taking 2000 mg with meals and 1000 mg  with snacks [provider]  Active Self, Pharmacy Records  lidocaine  (LIDODERM ) 5 % [id number] Yes Place 1 patch onto the skin as needed. Remove & Discard patch within 12 hours or as directed by MD [provider]  Active Self, Pharmacy Records  LORazepam  (ATIVAN ) 2 MG tablet [id number] Yes Take 1 tablet (2 mg total) by mouth 2 (two) times daily as needed  for anxiety.   Active Self, Pharmacy Records  midodrine  (PROAMATINE ) 10 MG tablet [id number] Yes Take 1 tablet (10 mg total) by mouth in the morning, at noon, and at bedtime. Vann, Jessica U, DO  Active Self, Pharmacy Records  Multiple Vitamins-Minerals (PRESERVISION AREDS 2) CAPS [id number] Yes Take 1 capsule by mouth in the morning and at bedtime. [provider]  Active Self, Pharmacy Records  naloxone  (NARCAN ) nasal spray 4 mg/0.1 mL [id number] Yes Take by nasal route every 3 minutes until patient awakes or EMS arrives.   Active Self, Pharmacy Records           Med  Note STEFFI ADELITA [place] Nov 07, 2024 11:34 AM)    Nutritional Supplements (FEEDING SUPPLEMENT, NEPRO CARB STEADY,) LIQD [id number] Yes Take 237 mLs by mouth as needed (missed meal during dialysis.). Al-Sultani, Anmar, MD  Active   oxyCODONE  (ROXICODONE ) 15 MG immediate release tablet [id number] Yes Take 1 tablet (15 mg total) by mouth 4 (four) times daily as needed.   Active Self, Pharmacy Records  pantoprazole  (PROTONIX ) 40 MG tablet [id number] Yes TAKE 1 TABLET BY MOUTH TWICE  DAILY Abran Norleen SAILOR, MD  Active Self, Pharmacy Records  pilocarpine  (SALAGEN ) 5 MG tablet [id number] Yes TAKE 1 TABLET BY MOUTH TWICE  DAILY Plotnikov, Aleksei V, MD  Active Self, Pharmacy Records  pioglitazone  (ACTOS ) 15 MG tablet [id number] Yes Take 1 tablet (15 mg total) by mouth daily. Plotnikov, Aleksei V, MD  Active Self, Pharmacy Records  polyethylene glycol (MIRALAX  / GLYCOLAX ) 17 g packet [id number] Yes Take 17 g by mouth daily as needed for moderate constipation. [provider]  Active Self, Pharmacy Records           Med Note KERRIN, MELISSA R   Thu Feb 02, 2024  2:01 PM)    PREVIDENT 5000 DRY MOUTH 1.1 % GEL dental gel [id number] Yes Place 1 application  onto teeth in the morning and at bedtime. [provider]  Active Self, Pharmacy Records  REPATHA  SURECLICK 140 MG/ML EMMANUEL [id number] Yes INJECT 1 PEN SUBCUTANEOUSLY  EVERY 2 WEEKS Croitoru, Mihai, MD  Active Self, Pharmacy Records           Med Note EFRAIM ALFREIDA LITTIE [place] Nov 29, 2024  8:36 PM) Patient is unsure if was supposed to take it last week. Needed this Sunday 12/02/24.  Semaglutide ,0.25 or 0.5MG /DOS, (OZEMPIC , 0.25 OR 0.5 MG/DOSE,) 2 MG/3ML NELMA [id number] Yes INJECT SUBCUTANEOUSLY 0.5 MG  EVERY WEEK Plotnikov, Aleksei V, MD  Active Self, Pharmacy Records           Med Note EFRAIM ALFREIDA LITTIE [place] Nov 29, 2024  8:37 PM) Patient is unsure if she is supposed to resume this medication.  Med List Note Mylo Powell LITTIE, CPhT  09/14/21 1722): Dialysis Tuesday, Thursday, Saturday - [facility].            Recommendation:   Continue Current Plan of Care  Follow Up Plan:   Telephone follow up appointment date/time:  12/21/24 in the afternoon  [name] Prow RN, CCM [place]  VBCI-Population Health RN Care Manager [phone]     "

## 2024-12-17 ENCOUNTER — Encounter: Payer: Self-pay | Admitting: Family Medicine

## 2024-12-17 ENCOUNTER — Ambulatory Visit: Admitting: Family Medicine

## 2024-12-17 VITALS — BP 146/68 | HR 98 | Temp 98.8°F | Ht 60.5 in | Wt 188.8 lb

## 2024-12-17 DIAGNOSIS — K625 Hemorrhage of anus and rectum: Secondary | ICD-10-CM

## 2024-12-17 DIAGNOSIS — N186 End stage renal disease: Secondary | ICD-10-CM

## 2024-12-17 DIAGNOSIS — K922 Gastrointestinal hemorrhage, unspecified: Secondary | ICD-10-CM

## 2024-12-17 DIAGNOSIS — D500 Iron deficiency anemia secondary to blood loss (chronic): Secondary | ICD-10-CM

## 2024-12-17 NOTE — Progress Notes (Signed)
 Call and notify patient labs show: 1.  Stable improved hemoglobin which has improved from 8.6 up to 9.5. 2.  Mildly low iron.   3.  Normal white count. 4.  Normal vitamin B12 and folate levels.  I recommend: Continue current vitamins prescribed for your nephrologist.  I would recommend eat foods in your diet containing iron such as lean red meat, poultry, seafood (oysters, clams, sardines), eggs, beans, lentils, dark leafy greens (spinach, kale), dried fruits (apricots, raisins, prunes), nuts, seeds, and iron-fortified cereals.  Continue having your lab work monitored at dialysis.  Please let us  know if your hemoglobin drops again or if you develop recurrent rectal bleeding.  Sincerely, Ellouise Console, PA-C

## 2024-12-17 NOTE — Patient Instructions (Addendum)
 Follow up with specialists as scheduled.   Follow up with GI as scheduled.   Continue dialysis as scheduled.  Follow up with Dr. Garald as scheduled

## 2024-12-17 NOTE — Progress Notes (Signed)
 "  Acute Office Visit  Subjective:     Patient ID: [name], female    DOB: October 16, 1951, 74 y.o.   MRN: [id number]  No chief complaint on file.   HPI  Discussed the use of AI scribe software for clinical note transcription with the patient, who gave verbal consent to proceed.  HFU admission 11/29/24-12/03/24 at [facility] hospital for colitis with rectal bleeding.  TOC call 12/04/24 History of Present Illness [name] is a 74 year old female who presents for follow-up after recent hospitalization and colonoscopy for gastrointestinal bleeding.  Gastrointestinal bleeding - Hospitalized for acute gastrointestinal bleeding with hemoglobin dropping to 7 g/dL, requiring transfusion - Colonoscopy identified a bleeding site in the right colon, which was clipped - No recurrent gastrointestinal bleeding since hospital discharge  Bowel habits and laxative use - Uses Benefiber to regulate bowel movements - Unable to tolerate Metamucil - Has taken Miralax  mixed with Benefiber twice since discharge - Restarted Ozempic  on Sunday without experiencing nausea or constipation  Laboratory findings and anemia management - Recent labs included B12, hemoglobin, hematocrit, iron, and ferritin - B12 level is 630 on Renovite - Total iron is slightly low; receives iron supplementation during dialysis - Ferritin is elevated with normal iron saturation - Hemoglobin improved to 9.5 g/dL after transfusion  Peripheral edema and mobility - Swelling in feet and lower legs after prolonged sitting and reduced mobility - Edema improves after dialysis - Resting at home and avoiding travel due to concern about possible recurrent bleeding and preference to remain near medical care     ROS Per HPI      Objective:    BP (!) 146/68 (BP Location: Left Arm, Patient Position: Sitting)   Pulse 98   Temp 98.8 F (37.1 C) (Temporal)   Ht 5' 0.5 (1.537 m)   Wt 188 lb 12.8 oz (85.6 kg)   SpO2 96%    BMI 36.27 kg/m    Physical Exam Vitals and nursing note reviewed.  Constitutional:      General: She is not in acute distress.    Appearance: Normal appearance. She is normal weight.  HENT:     Head: Normocephalic and atraumatic.     Right Ear: External ear normal.     Left Ear: External ear normal.     Nose: Nose normal.     Mouth/Throat:     Mouth: Mucous membranes are moist.     Pharynx: Oropharynx is clear.  Eyes:     Extraocular Movements: Extraocular movements intact.     Pupils: Pupils are equal, round, and reactive to light.  Cardiovascular:     Rate and Rhythm: Normal rate and regular rhythm.     Pulses: Normal pulses.     Heart sounds: Normal heart sounds.     Comments: Fistula in place to R upper arm Pulmonary:     Effort: Pulmonary effort is normal. No respiratory distress.     Breath sounds: Normal breath sounds. No wheezing, rhonchi or rales.  Musculoskeletal:     Cervical back: Normal range of motion.     Right lower leg: No edema.     Left lower leg: No edema.     Comments: Using single point cane  Lymphadenopathy:     Cervical: No cervical adenopathy.  Neurological:     General: No focal deficit present.     Mental Status: She is alert and oriented to person, place, and time.  Psychiatric:  Mood and Affect: Mood normal.        Thought Content: Thought content normal.     No results found for any visits on 12/17/24.      Assessment & Plan:   Assessment and Plan Assessment & Plan Blood loss anemia secondary to gastrointestinal hemorrhage Recent GI bleeding required transfusion and colonoscopy. Hemoglobin improved post-transfusion. Clot removed from right colon. Stable hemoglobin and hematocrit. Adequate iron levels with supplementation. - Continue iron supplementation during dialysis. - Monitor hemoglobin and hematocrit with lab work. - Declines f/u CBC today, Hgb on 12/11/24 9.5 after receiving 1 unit PRBC while in the hospital - Follow  up with GI for any bleeding recurrence.  End stage renal disease on hemodialysis Stable iron levels and hemoglobin. No acute dialysis issues. Swelling in feet and legs likely from sitting and hospitalization. - Continue regular hemodialysis schedule. - Declines labs today - Please send us  a copy of the labs that they draw for you this week.     No orders of the defined types were placed in this encounter.    No orders of the defined types were placed in this encounter.   Return for as scheduled.  Corean LITTIE Ku, FNP  "

## 2024-12-21 ENCOUNTER — Telehealth: Payer: Self-pay

## 2024-12-21 NOTE — Transitions of Care (Post Inpatient/ED Visit) (Signed)
 " Transition of Care week 4  Visit Note  12/21/2024  Name: [name] MRN: [id number]          DOB: [birth year]  Situation: Patient enrolled in [facility] 30-day program. Visit completed with patient by telephone.   Background: Admit/Discharge Date:   12/18 - 12/22 [name]  Primary Diagnosis: Colitis with rectal bleeding w/colonoscopy 12/01/24   Initial Transition Care Management Follow-up Telephone Call Discharge Date and Diagnosis: 12/03/24, Colitis with rectal bleeding   Past Medical History:  Diagnosis Date   Anemia    Anxiety    Brain tumor (benign) ([name])    Colitis 2010   microscopic- Dr Abran   Depression    Diabetes mellitus    type II   Diverticulosis    Dyspnea    with exertion   ESRD (end stage renal disease) ([name])    TTUSAT [name]    Fibromyalgia    GERD (gastroesophageal reflux disease)    Headache    Hemodialysis patient    History of blood transfusion    after knee surgery   Hypertension    discontinued all diuretics and antihypertensives   IBS (irritable bowel syndrome)    LBP (low back pain)    Neuropathy    feet bilat    Osteoarthritis    Osteopenia    Pneumonia    hx of 2014    Rotator cuff tear, right    Sinusitis    currently being treated with antibiotic will complete 03/04/2015    Assessment: Patient Reported Symptoms: Cognitive Cognitive Status: No symptoms reported, Normal speech and language skills, Alert and oriented to person, place, and time      Neurological Neurological Review of Symptoms: Other: Oher Neurological Symptoms/Conditions [RPT]: Patient continues to state the neuropathy pain is no better/no worse-has not needed pain med today    HEENT HEENT Symptoms Reported: No symptoms reported      Cardiovascular Cardiovascular Symptoms Reported: Swelling in legs or feet Weight: 184 lb 15.5 oz (83.9 kg)  Respiratory Respiratory Symptoms Reported: No symptoms reported    Endocrine Endocrine Symptoms Reported: No symptoms  reported Is patient diabetic?: Yes Is patient checking blood sugars at home?: Yes List most recent blood sugar readings, include date and time of day: Patient reports recent sugars 1/6 was 131 and later on 1/6 was 126 after lunch; 1/8 after eating 133 - A1C 10.1.25 was 6,1 Endocrine Self-Management Outcome: 4 (good)  Gastrointestinal Gastrointestinal Symptoms Reported: No symptoms reported Other Gastrointestinal Symptoms: patient denies any further bleeding and states this is the longest time she's gone with no bleeding and feels hopeful      Genitourinary Genitourinary Symptoms Reported: Other Genitourinary Management Strategies: Hemodialysis Hemodialysis Schedule: Tues, Thus, Sat Hemodialysis Last Treatment: 12/20/24 Genitourinary Self-Management Outcome: 3 (uncertain)  Integumentary Integumentary Symptoms Reported: Other Other Integumentary Symptoms: fistula right upper arm - patient denies issue    Musculoskeletal Musculoskelatal Symptoms Reviewed: Unsteady gait Additional Musculoskeletal Details: patient reports neuropathy discomfort comes and goes but does impact her when it's bad Musculoskeletal Self-Management Outcome: 3 (uncertain)      Psychosocial           Today's Vitals   12/21/24 1411  Weight: 184 lb 15.5 oz (83.9 kg)   Pain Scale: 0-10 Pain Score: 0-No pain (Patient states it's just numb-not painful)  Medications Reviewed Today     Reviewed by Lauro Shona LABOR, RN (Registered Nurse) on 12/21/24 at 1407  Med List Status: <None>   Medication Order  Taking? Sig Documenting Provider Last Dose Status Informant  Accu-Chek Softclix Lancets lancets [id number] Yes Use 1 lancet up to 4 times daily as directed Samtani, Jai-Gurmukh, MD  Active Self, Pharmacy Records  acetaminophen  (TYLENOL ) 500 MG tablet [id number] Yes Take 1,000 mg by mouth 2 (two) times daily as needed for headache. [provider]  Active Self, Pharmacy Records  acidophilus [facility]) CAPS  capsule [id number] Yes Take 2 capsules by mouth daily. Vann, Jessica U, DO  Active Self, Pharmacy Records  B Complex-C-Folic Acid  (RENA-VITE PO) [id number] Yes Take 1 tablet by mouth every morning. [provider]  Active Self, Pharmacy Records  buPROPion  (WELLBUTRIN  XL) 300 MG 24 hr tablet [id number] Yes Take 1 tablet (300 mg total) by mouth in the morning. Plotnikov, Aleksei V, MD  Active Self, Pharmacy Records  carboxymethylcellulose (REFRESH PLUS) 0.5 % SOLN [id number] Yes Place 1 drop into both eyes 3 (three) times daily as needed (dry eyes/irritation). [provider]  Active Self, Pharmacy Records  cinacalcet  (SENSIPAR ) 30 MG tablet [id number] Yes Take 30 mg by mouth Every Tuesday,Thursday,and Saturday with dialysis. [provider]  Active Self, Pharmacy Records  cyclobenzaprine  (FLEXERIL ) 10 MG tablet [id number] Yes Take 1 tablet (10 mg total) by mouth 3 (three) times daily as needed for muscle spasms. Plotnikov, Aleksei V, MD  Active Self, Pharmacy Records  diphenhydramine -acetaminophen  (TYLENOL  PM) 25-500 MG TABS tablet [id number] Yes Take 2 tablets by mouth 3 (three) times a week. At bedtime [provider]  Active Self, Pharmacy Records  docusate sodium  (COLACE) 50 MG capsule [id number] Yes Take 50-100 mg by mouth 2 (two) times daily. 1 qam, 2 qpm [provider]  Active Self, Pharmacy Records  FLUoxetine  (PROZAC ) 40 MG capsule [id number] Yes Take 1 capsule (40 mg total) by mouth in the morning.   Active Self, Pharmacy Records  fluticasone  (FLONASE ) 50 MCG/ACT nasal spray [id number] Yes Place 2 sprays into both nostrils daily.  Patient taking differently: Place 2 sprays into both nostrils daily. Patient takes as needed   Soldatova, Liuba, MD  Active Self, Pharmacy Records  glimepiride  (AMARYL ) 1 MG tablet [id number] Yes Take 1 tablet (1 mg total) by mouth daily with breakfast. Plotnikov, Aleksei V, MD  Active Self, Pharmacy Records  ketoconazole  (NIZORAL ) 2  % cream [id number] Yes Apply 1 Application topically daily. Plotnikov, Aleksei V, MD  Active Self, Pharmacy Records  lanthanum [facility]) 1000 MG chewable tablet [id number] Yes Chew 1,000-2,000 mg by mouth See admin instructions. Taking 2000 mg with meals and 1000 mg  with snacks [provider]  Active Self, Pharmacy Records  lidocaine  (LIDODERM ) 5 % [id number] Yes Place 1 patch onto the skin as needed. Remove & Discard patch within 12 hours or as directed by MD [provider]  Active Self, Pharmacy Records  LORazepam  (ATIVAN ) 2 MG tablet [id number] Yes Take 1 tablet (2 mg total) by mouth 2 (two) times daily as needed for anxiety.   Active Self, Pharmacy Records  midodrine  (PROAMATINE ) 10 MG tablet [id number] Yes Take 1 tablet (10 mg total) by mouth in the morning, at noon, and at bedtime. Vann, Jessica U, DO  Active Self, Pharmacy Records  Multiple Vitamins-Minerals (PRESERVISION AREDS 2) CAPS [id number] Yes Take 1 capsule by mouth in the morning and at bedtime. [provider]  Active Self, Pharmacy Records  naloxone  (NARCAN ) nasal spray 4 mg/0.1 mL [id number] Yes Take by nasal route every 3 minutes until patient awakes or EMS arrives.   Active Self, Pharmacy Records  Med Note STEFFI NIAN   Wed Nov 07, 2024 11:34 AM)    Nutritional Supplements (FEEDING SUPPLEMENT, NEPRO CARB STEADY,) LIQD [id number]  Take 237 mLs by mouth as needed (missed meal during dialysis.).  Patient not taking: Reported on 12/21/2024   Al-Sultani, Anmar, MD  Active   oxyCODONE  (ROXICODONE ) 15 MG immediate release tablet [id number] Yes Take 1 tablet (15 mg total) by mouth 4 (four) times daily as needed.   Active Self, Pharmacy Records  pantoprazole  (PROTONIX ) 40 MG tablet [id number] Yes TAKE 1 TABLET BY MOUTH TWICE  DAILY Abran Norleen SAILOR, MD  Active Self, Pharmacy Records  pilocarpine  (SALAGEN ) 5 MG tablet [id number] Yes TAKE 1 TABLET BY MOUTH TWICE  DAILY Plotnikov, Aleksei V, MD  Active Self,  Pharmacy Records  pioglitazone  (ACTOS ) 15 MG tablet [id number] Yes Take 1 tablet (15 mg total) by mouth daily. Plotnikov, Aleksei V, MD  Active Self, Pharmacy Records  polyethylene glycol (MIRALAX  / GLYCOLAX ) 17 g packet [id number] Yes Take 17 g by mouth daily as needed for moderate constipation. [provider]  Active Self, Pharmacy Records           Med Note KERRIN, MELISSA R   Thu Feb 02, 2024  2:01 PM)    PREVIDENT 5000 DRY MOUTH 1.1 % GEL dental gel [id number] Yes Place 1 application  onto teeth in the morning and at bedtime. [provider]  Active Self, Pharmacy Records  REPATHA  SURECLICK 140 MG/ML EMMANUEL [id number] Yes INJECT 1 PEN SUBCUTANEOUSLY  EVERY 2 WEEKS Croitoru, Mihai, MD  Active Self, Pharmacy Records           Med Note EFRAIM ALFREIDA LITTIE [place] Nov 29, 2024  8:36 PM) Patient is unsure if was supposed to take it last week. Needed this Sunday 12/02/24.  Semaglutide ,0.25 or 0.5MG /DOS, (OZEMPIC , 0.25 OR 0.5 MG/DOSE,) 2 MG/3ML NELMA [id number] Yes INJECT SUBCUTANEOUSLY 0.5 MG  EVERY WEEK Plotnikov, Aleksei V, MD  Active Self, Pharmacy Records           Med Note EFRAIM ALFREIDA LITTIE [place] Nov 29, 2024  8:37 PM) Patient is unsure if she is supposed to resume this medication.  Med List Note Mylo Powell LITTIE, CPhT 09/14/21 1722): Dialysis Tuesday, Thursday, Saturday - [facility].            Recommendation:   Continue Current Plan of Care  Follow Up Plan:   Telephone follow up appointment date/time:  12/28/24 in the afternoon  Shona Prow RN, CCM [place]  VBCI-Population Health RN Care Manager [phone]     "

## 2024-12-23 ENCOUNTER — Other Ambulatory Visit: Payer: Self-pay | Admitting: Internal Medicine

## 2024-12-28 ENCOUNTER — Telehealth: Payer: Self-pay

## 2024-12-28 NOTE — Transitions of Care (Post Inpatient/ED Visit) (Signed)
" °  Transition of Care week 4  Visit Note  12/28/2024  Name: [name] MRN: [id number]          DOB: Mar 01, 1951  Situation: Patient enrolled in [facility] 30-day program. Visit completed with patient by telephone.   Background: Admit/Discharge Date:   12/18 - 12/22 [name] Primary Diagnosis: Colitis with rectal bleeding w/colonoscopy   Initial Transition Care Management Follow-up Telephone Call Discharge Date and Diagnosis: 12/03/24, Colitis with rectal bleeding   Past Medical History:  Diagnosis Date   Anemia    Anxiety    Brain tumor (benign) ([name])    Colitis 2010   microscopic- Dr Abran   Depression    Diabetes mellitus    type II   Diverticulosis    Dyspnea    with exertion   ESRD (end stage renal disease) ([name])    TTUSAT [address]    Fibromyalgia    GERD (gastroesophageal reflux disease)    Headache    Hemodialysis patient    History of blood transfusion    after knee surgery   Hypertension    discontinued all diuretics and antihypertensives   IBS (irritable bowel syndrome)    LBP (low back pain)    Neuropathy    feet bilat    Osteoarthritis    Osteopenia    Pneumonia    hx of 2014    Rotator cuff tear, right    Sinusitis    currently being treated with antibiotic will complete 03/04/2015    Assessment: Patient Reported Symptoms: Cognitive Cognitive Status: No symptoms reported, Normal speech and language skills, Alert and oriented to person, place, and time      Neurological Neurological Review of Symptoms: Other: Neurological Self-Management Outcome: 3 (uncertain)  HEENT HEENT Symptoms Reported: No symptoms reported      Cardiovascular Cardiovascular Symptoms Reported: No symptoms reported (patient states swelling much much better)    Respiratory Respiratory Symptoms Reported: No symptoms reported    Endocrine Endocrine Symptoms Reported: No symptoms reported List most recent blood sugar readings, include date and time of day: Patient states  she has not checked her sugar today    Gastrointestinal Gastrointestinal Symptoms Reported: No symptoms reported Other Gastrointestinal Symptoms: continues to deny further bleeding      Genitourinary Genitourinary Symptoms Reported: Other Other Genitourinary Symptoms: dialysis patient [name], Sat    Integumentary Integumentary Symptoms Reported: Other, Itching Additional Integumentary Details: patient states she saw dermatology - itchy scalp and states she was told it's a (yeast infection) - prescribed ketoconazole  shampoo 2x/wek -clobetasol propionate apply daily - patient states it's  already is making a difference Skin Management Strategies: Medication therapy  Musculoskeletal Musculoskelatal Symptoms Reviewed: Unsteady gait        Psychosocial           There were no vitals filed for this visit. Pain Scale: 0-10 Pain Score: 0-No pain  Medications Reviewed Today   Medications were not reviewed in this encounter     Recommendation:   Continue Current Plan of Care  Follow Up Plan:   Telephone follow up appointment date/time:  01/04/25 in the morning  Anita Prow RN, CCM [place]  VBCI-Population Health RN Care Manager [phone]     "

## 2025-01-02 ENCOUNTER — Ambulatory Visit: Admitting: Gastroenterology

## 2025-01-04 ENCOUNTER — Telehealth: Payer: Self-pay

## 2025-01-04 NOTE — Transitions of Care (Post Inpatient/ED Visit) (Signed)
 " Transition of Care week #5 TOC program closure  Visit Note  01/04/2025  Name: [name] MRN: [id number]          DOB: 26-Dec-1950  Situation: Patient enrolled in [facility] 30-day program. Visit completed with patient by telephone.   Background: Admit/Discharge Date:   12/18 - 12/22 [name]  Primary Diagnosis: Colitis with rectal bleeding w/colonoscopy 12/01/24   Initial Transition Care Management Follow-up Telephone Call Discharge Date and Diagnosis: 12/03/24, Colitis with rectal bleeding   Past Medical History:  Diagnosis Date   Anemia    Anxiety    Brain tumor (benign) ([name])    Colitis 2010   microscopic- Dr [name]   Depression    Diabetes mellitus    type II   Diverticulosis    Dyspnea    with exertion   ESRD (end stage renal disease) ([name])    TTUSAT [name]    Fibromyalgia    GERD (gastroesophageal reflux disease)    Headache    Hemodialysis patient    History of blood transfusion    after knee surgery   Hypertension    discontinued all diuretics and antihypertensives   IBS (irritable bowel syndrome)    LBP (low back pain)    Neuropathy    feet bilat    Osteoarthritis    Osteopenia    Pneumonia    hx of 2014    Rotator cuff tear, right    Sinusitis    currently being treated with antibiotic will complete 03/04/2015    Assessment: Patient Reported Symptoms: Cognitive Cognitive Status: No symptoms reported, Normal speech and language skills, Alert and oriented to person, place, and time      Neurological Neurological Review of Symptoms: Other: Oher Neurological Symptoms/Conditions [RPT]: neuropathy Neurological Management Strategies: Medication therapy Neurological Self-Management Outcome: 4 (good)  HEENT HEENT Symptoms Reported: No symptoms reported      Cardiovascular Cardiovascular Symptoms Reported: No symptoms reported (patient states legs are really good now)    Respiratory Respiratory Symptoms Reported: No symptoms reported    Endocrine  Endocrine Symptoms Reported: No symptoms reported Is patient diabetic?: Yes Is patient checking blood sugars at home?: Yes List most recent blood sugar readings, include date and time of day: Patient states she has not checked her sugar    Gastrointestinal Gastrointestinal Symptoms Reported: Incontinence Other Gastrointestinal Symptoms: patient states she had a bm incontinence at dialysis when she feel asleep - states she was very relaxed and this is not usual- denies any issues since - denies any bleeding      Genitourinary Genitourinary Symptoms Reported: Other Other Genitourinary Symptoms: continues with dialysis [name], Sat Genitourinary Management Strategies: Hemodialysis  Integumentary Integumentary Symptoms Reported: Other Other Integumentary Symptoms: patient has fistula right upper arm Additional Integumentary Details: Patient states itchy scalp has much improved    Musculoskeletal Musculoskelatal Symptoms Reviewed: Other Additional Musculoskeletal Details: ongoing neuropathy        Psychosocial Psychosocial Symptoms Reported: No symptoms reported         There were no vitals filed for this visit. Pain Scale: 0-10 Pain Score: 0-No pain  Medications Reviewed Today     Reviewed by [name] LABOR, RN (Registered Nurse) on 01/04/25 at 1100  Med List Status: <None>   Medication Order Taking? Sig Documenting Provider Last Dose Status Informant  Accu-Chek Softclix Lancets lancets [id number] Yes Use 1 lancet up to 4 times daily as directed Samtani, Jai-Gurmukh, [name]  Active Self, Pharmacy Records  acetaminophen  (TYLENOL )  500 MG tablet [id number] Yes Take 1,000 mg by mouth 2 (two) times daily as needed for headache. Provider, Historical, [name]  Active Self, Pharmacy Records  acidophilus [facility]) CAPS capsule [id number] Yes Take 2 capsules by mouth daily. Vann, Jessica [name], [name]  Active Self, Pharmacy Records  B Complex-C-Folic Acid  (RENA-VITE PO) [id number] Yes Take 1 tablet by mouth  every morning. Provider, Historical, [name]  Active Self, Pharmacy Records  buPROPion  (WELLBUTRIN  XL) 300 MG 24 hr tablet [id number] Yes Take 1 tablet (300 mg total) by mouth in the morning. Plotnikov, Aleksei [name], [name]  Active Self, Pharmacy Records  carboxymethylcellulose (REFRESH PLUS) 0.5 % SOLN [id number] Yes Place 1 drop into both eyes 3 (three) times daily as needed (dry eyes/irritation). Provider, Historical, [name]  Active Self, Pharmacy Records  cinacalcet  (SENSIPAR ) 30 MG tablet [id number] Yes Take 30 mg by mouth Every Tuesday,Thursday,and Saturday with dialysis. Provider, Historical, [name]  Active Self, Pharmacy Records  Clobetasol Prop Emollient Base (CLOBETASOL PROPIONATE E) 0.05 % emollient cream [id number] Yes Apply 0.05 Applications topically daily. Provider, Historical, [name]  Active   cyclobenzaprine  (FLEXERIL ) 10 MG tablet [id number] Yes Take 1 tablet (10 mg total) by mouth 3 (three) times daily as needed for muscle spasms. Plotnikov, Aleksei [name], [name]  Active Self, Pharmacy Records  diphenhydramine -acetaminophen  (TYLENOL  PM) 25-500 MG TABS tablet [id number] Yes Take 2 tablets by mouth 3 (three) times a week. At bedtime Provider, Historical, [name]  Active Self, Pharmacy Records  docusate sodium  (COLACE) 50 MG capsule [id number] Yes Take 50-100 mg by mouth 2 (two) times daily. 1 qam, 2 qpm Provider, Historical, [name]  Active Self, Pharmacy Records  FLUoxetine  (PROZAC ) 40 MG capsule [id number] Yes Take 1 capsule (40 mg total) by mouth in the morning.   Active Self, Pharmacy Records  fluticasone  (FLONASE ) 50 MCG/ACT nasal spray [id number] Yes Place 2 sprays into both nostrils daily.  Patient taking differently: Place 2 sprays into both nostrils daily. Patient takes as needed   Soldatova, [place], [name]  Active Self, Pharmacy Records  glimepiride  (AMARYL ) 1 MG tablet [id number] Yes Take 1 tablet (1 mg total) by mouth daily with breakfast. Plotnikov, Aleksei [name], [name]  Active Self, Pharmacy Records  ketoconazole  (NIZORAL ) 2 % cream  [id number] Yes Apply 1 Application topically daily. Plotnikov, Aleksei [name], [name]  Active Self, Pharmacy Records  ketoconazole  (NIZORAL ) 2 % shampoo [id number] Yes Apply 1 Application topically 2 (two) times a week. Provider, Historical, [name]  Active   lanthanum (FOSRENOL) 1000 MG chewable tablet [id number] Yes Chew 1,000-2,000 mg by mouth See admin instructions. Taking 2000 mg with meals and 1000 mg  with snacks Provider, Historical, [name]  Active Self, Pharmacy Records  lidocaine  (LIDODERM ) 5 % [id number] Yes Place 1 patch onto the skin as needed. Remove & Discard patch within 12 hours or as directed by [name] Provider, Historical, [name]  Active Self, Pharmacy Records  LORazepam  (ATIVAN ) 2 MG tablet [id number] Yes Take 1 tablet (2 mg total) by mouth 2 (two) times daily as needed for anxiety.   Active Self, Pharmacy Records  midodrine  (PROAMATINE ) 10 MG tablet [id number] Yes Take 1 tablet (10 mg total) by mouth in the morning, at noon, and at bedtime. Vann, Jessica [name], [name]  Active Self, Pharmacy Records  Multiple Vitamins-Minerals (PRESERVISION AREDS 2) CAPS [id number] Yes Take 1 capsule by mouth in the morning and at bedtime. Provider, Historical, [name]  Active Self, Pharmacy Records  naloxone  (NARCAN ) nasal spray 4 mg/0.1 mL [id number] Yes Take by nasal route every 3 minutes until patient  awakes or EMS arrives.   Active Self, Pharmacy Records           Med Note STEFFI NIAN   Wed Nov 07, 2024 11:34 AM)    Nutritional Supplements (FEEDING SUPPLEMENT, NEPRO CARB STEADY,) LIQD [id number]  Take 237 mLs by mouth as needed (missed meal during dialysis.).  Patient not taking: Reported on 01/04/2025   Al-Sultani, Anmar, [name]  Active   oxyCODONE  (ROXICODONE ) 15 MG immediate release tablet [id number] Yes Take 1 tablet (15 mg total) by mouth 4 (four) times daily as needed.   Active Self, Pharmacy Records  OZEMPIC , 0.25 OR 0.5 MG/DOSE, 2 MG/3ML SOPN [id number] Yes INJECT SUBCUTANEOUSLY 0.5 MG  EVERY WEEK Plotnikov, Aleksei [name], [name]  Active    pantoprazole  (PROTONIX ) 40 MG tablet [id number] Yes TAKE 1 TABLET BY MOUTH TWICE  DAILY [name] Norleen SAILOR, [name]  Active Self, Pharmacy Records  pilocarpine  (SALAGEN ) 5 MG tablet [id number] Yes TAKE 1 TABLET BY MOUTH TWICE  DAILY Plotnikov, Aleksei [name], [name]  Active Self, Pharmacy Records  pioglitazone  (ACTOS ) 15 MG tablet [id number] Yes Take 1 tablet (15 mg total) by mouth daily. Plotnikov, Aleksei [name], [name]  Active Self, Pharmacy Records  polyethylene glycol (MIRALAX  / GLYCOLAX ) 17 g packet [id number] Yes Take 17 g by mouth daily as needed for moderate constipation. Provider, Historical, [name]  Active Self, Pharmacy Records           Med Note KERRIN, MELISSA R   Thu Feb 02, 2024  2:01 PM)    PREVIDENT 5000 DRY MOUTH 1.1 % GEL dental gel [id number] Yes Place 1 application  onto teeth in the morning and at bedtime. Provider, Historical, [name]  Active Self, Pharmacy Records  REPATHA  SURECLICK 140 MG/ML EMMANUEL [id number] Yes INJECT 1 PEN SUBCUTANEOUSLY  EVERY 2 WEEKS Croitoru, Mihai, [name]  Active Self, Pharmacy Records           Med Note EFRAIM ALFREIDA LITTIE [place] Nov 29, 2024  8:36 PM) Patient is unsure if was supposed to take it last week. Needed this Sunday 12/02/24.  Med List Note Mylo Powell LITTIE, CPhT 09/14/21 1722): Dialysis Tuesday, Thursday, Saturday - [facility].            Recommendation:   Patient will continue to follow with providers and dialysis as directed  Follow Up Plan:   Closing From:  Transitions of Care Program  [name] Prow RN, CCM [facility]  VBCI-Population Health RN Care Manager [phone]     "

## 2025-03-13 ENCOUNTER — Ambulatory Visit: Admitting: Internal Medicine

## 2025-08-22 ENCOUNTER — Ambulatory Visit

## 2025-09-27 ENCOUNTER — Ambulatory Visit: Admitting: Neurology

## 2025-10-02 ENCOUNTER — Ambulatory Visit: Admitting: Neurology
